# Patient Record
Sex: Female | Born: 1945
Health system: Southern US, Community
[De-identification: ages and names within clinical notes are randomized; demographics above are authoritative.]

## PROBLEM LIST (undated history)

## (undated) DIAGNOSIS — L301 Dyshidrosis [pompholyx]: Secondary | ICD-10-CM

## (undated) DIAGNOSIS — I714 Abdominal aortic aneurysm, without rupture, unspecified: Secondary | ICD-10-CM

## (undated) DIAGNOSIS — K760 Fatty (change of) liver, not elsewhere classified: Secondary | ICD-10-CM

## (undated) DIAGNOSIS — I739 Peripheral vascular disease, unspecified: Secondary | ICD-10-CM

## (undated) DIAGNOSIS — M797 Fibromyalgia: Secondary | ICD-10-CM

## (undated) DIAGNOSIS — E785 Hyperlipidemia, unspecified: Secondary | ICD-10-CM

## (undated) DIAGNOSIS — D132 Benign neoplasm of duodenum: Secondary | ICD-10-CM

## (undated) DIAGNOSIS — K219 Gastro-esophageal reflux disease without esophagitis: Secondary | ICD-10-CM

## (undated) DIAGNOSIS — T7840XA Allergy, unspecified, initial encounter: Secondary | ICD-10-CM

## (undated) DIAGNOSIS — K5790 Diverticulosis of intestine, part unspecified, without perforation or abscess without bleeding: Secondary | ICD-10-CM

## (undated) DIAGNOSIS — I209 Angina pectoris, unspecified: Secondary | ICD-10-CM

## (undated) DIAGNOSIS — J439 Emphysema, unspecified: Secondary | ICD-10-CM

## (undated) DIAGNOSIS — D126 Benign neoplasm of colon, unspecified: Secondary | ICD-10-CM

## (undated) DIAGNOSIS — I10 Essential (primary) hypertension: Secondary | ICD-10-CM

## (undated) DIAGNOSIS — I701 Atherosclerosis of renal artery: Secondary | ICD-10-CM

## (undated) DIAGNOSIS — M810 Age-related osteoporosis without current pathological fracture: Secondary | ICD-10-CM

## (undated) DIAGNOSIS — J449 Chronic obstructive pulmonary disease, unspecified: Secondary | ICD-10-CM

## (undated) DIAGNOSIS — G43909 Migraine, unspecified, not intractable, without status migrainosus: Secondary | ICD-10-CM

## (undated) DIAGNOSIS — M1990 Unspecified osteoarthritis, unspecified site: Secondary | ICD-10-CM

## (undated) DIAGNOSIS — I219 Acute myocardial infarction, unspecified: Secondary | ICD-10-CM

## (undated) DIAGNOSIS — E119 Type 2 diabetes mellitus without complications: Secondary | ICD-10-CM

## (undated) DIAGNOSIS — I2510 Atherosclerotic heart disease of native coronary artery without angina pectoris: Secondary | ICD-10-CM

## (undated) HISTORY — PX: BREAST BIOPSY: SHX20

## (undated) HISTORY — DX: Emphysema, unspecified: J43.9

## (undated) HISTORY — DX: Atherosclerotic heart disease of native coronary artery without angina pectoris: I25.10

## (undated) HISTORY — DX: Age-related osteoporosis without current pathological fracture: M81.0

## (undated) HISTORY — DX: Unspecified osteoarthritis, unspecified site: M19.90

## (undated) HISTORY — DX: Dyshidrosis (pompholyx): L30.1

## (undated) HISTORY — DX: Migraine, unspecified, not intractable, without status migrainosus: G43.909

## (undated) HISTORY — PX: CARDIAC CATHETERIZATION: SHX172

## (undated) HISTORY — DX: Diverticulosis of intestine, part unspecified, without perforation or abscess without bleeding: K57.90

## (undated) HISTORY — DX: Hyperlipidemia, unspecified: E78.5

## (undated) HISTORY — PX: TONSILLECTOMY: SUR1361

## (undated) HISTORY — DX: Benign neoplasm of colon, unspecified: D12.6

## (undated) HISTORY — DX: Type 2 diabetes mellitus without complications: E11.9

## (undated) HISTORY — DX: Chronic obstructive pulmonary disease, unspecified: J44.9

## (undated) HISTORY — DX: Essential (primary) hypertension: I10

## (undated) HISTORY — DX: Allergy, unspecified, initial encounter: T78.40XA

## (undated) HISTORY — DX: Peripheral vascular disease, unspecified: I73.9

## (undated) HISTORY — DX: Fibromyalgia: M79.7

## (undated) HISTORY — DX: Benign neoplasm of duodenum: D13.2

## (undated) HISTORY — DX: Atherosclerosis of renal artery: I70.1

## (undated) HISTORY — PX: ABDOMINAL HYSTERECTOMY: SHX81

## (undated) HISTORY — PX: OTHER SURGICAL HISTORY: SHX169

---

## 1984-12-06 DIAGNOSIS — L301 Dyshidrosis [pompholyx]: Secondary | ICD-10-CM

## 1984-12-06 HISTORY — DX: Dyshidrosis (pompholyx): L30.1

## 1987-12-07 HISTORY — PX: TONSILECTOMY, ADENOIDECTOMY, BILATERAL MYRINGOTOMY AND TUBES: SHX2538

## 1990-12-06 HISTORY — PX: TOTAL ABDOMINAL HYSTERECTOMY: SHX209

## 1993-08-06 HISTORY — PX: CARPAL TUNNEL RELEASE: SHX101

## 1994-07-06 HISTORY — PX: CORONARY ARTERY BYPASS GRAFT: SHX141

## 1997-01-06 HISTORY — PX: CARPAL TUNNEL RELEASE: SHX101

## 1998-05-13 ENCOUNTER — Ambulatory Visit (HOSPITAL_COMMUNITY): Admission: RE | Admit: 1998-05-13 | Discharge: 1998-05-13 | Payer: Self-pay | Admitting: Otolaryngology

## 1998-05-30 ENCOUNTER — Other Ambulatory Visit: Admission: RE | Admit: 1998-05-30 | Discharge: 1998-05-30 | Payer: Self-pay | Admitting: Otolaryngology

## 1998-07-11 ENCOUNTER — Other Ambulatory Visit: Admission: RE | Admit: 1998-07-11 | Discharge: 1998-07-11 | Payer: Self-pay | Admitting: Otolaryngology

## 1998-12-06 HISTORY — PX: MULTIPLE TOOTH EXTRACTIONS: SHX2053

## 2000-09-13 ENCOUNTER — Encounter: Payer: Self-pay | Admitting: Cardiology

## 2000-09-13 ENCOUNTER — Encounter: Admission: RE | Admit: 2000-09-13 | Discharge: 2000-09-13 | Payer: Self-pay | Admitting: Cardiology

## 2001-05-02 ENCOUNTER — Other Ambulatory Visit: Admission: RE | Admit: 2001-05-02 | Discharge: 2001-05-02 | Payer: Self-pay | Admitting: Obstetrics and Gynecology

## 2001-05-06 DIAGNOSIS — D126 Benign neoplasm of colon, unspecified: Secondary | ICD-10-CM

## 2001-05-06 HISTORY — DX: Benign neoplasm of colon, unspecified: D12.6

## 2001-05-17 ENCOUNTER — Encounter (INDEPENDENT_AMBULATORY_CARE_PROVIDER_SITE_OTHER): Payer: Self-pay | Admitting: Specialist

## 2001-05-17 ENCOUNTER — Other Ambulatory Visit: Admission: RE | Admit: 2001-05-17 | Discharge: 2001-05-17 | Payer: Self-pay | Admitting: Gastroenterology

## 2001-09-19 ENCOUNTER — Encounter: Payer: Self-pay | Admitting: Cardiology

## 2001-09-19 ENCOUNTER — Encounter: Admission: RE | Admit: 2001-09-19 | Discharge: 2001-09-19 | Payer: Self-pay | Admitting: Cardiology

## 2002-09-05 HISTORY — PX: HAND SURGERY: SHX662

## 2002-09-20 ENCOUNTER — Encounter: Admission: RE | Admit: 2002-09-20 | Discharge: 2002-09-20 | Payer: Self-pay | Admitting: Obstetrics and Gynecology

## 2002-09-20 ENCOUNTER — Encounter: Payer: Self-pay | Admitting: Cardiology

## 2002-09-20 ENCOUNTER — Encounter: Admission: RE | Admit: 2002-09-20 | Discharge: 2002-09-20 | Payer: Self-pay | Admitting: Cardiology

## 2002-09-20 ENCOUNTER — Encounter: Payer: Self-pay | Admitting: Obstetrics and Gynecology

## 2003-03-06 ENCOUNTER — Encounter: Admission: RE | Admit: 2003-03-06 | Discharge: 2003-03-06 | Payer: Self-pay | Admitting: Obstetrics and Gynecology

## 2003-03-06 ENCOUNTER — Encounter: Payer: Self-pay | Admitting: Obstetrics and Gynecology

## 2003-05-01 ENCOUNTER — Encounter: Payer: Self-pay | Admitting: Cardiology

## 2003-05-01 ENCOUNTER — Encounter: Admission: RE | Admit: 2003-05-01 | Discharge: 2003-05-01 | Payer: Self-pay | Admitting: Cardiology

## 2003-10-21 ENCOUNTER — Encounter: Admission: RE | Admit: 2003-10-21 | Discharge: 2003-10-21 | Payer: Self-pay | Admitting: Obstetrics and Gynecology

## 2004-10-30 ENCOUNTER — Encounter: Admission: RE | Admit: 2004-10-30 | Discharge: 2004-10-30 | Payer: Self-pay | Admitting: Obstetrics and Gynecology

## 2005-12-08 ENCOUNTER — Encounter: Admission: RE | Admit: 2005-12-08 | Discharge: 2005-12-08 | Payer: Self-pay | Admitting: Obstetrics and Gynecology

## 2006-12-06 HISTORY — PX: RENAL ARTERY STENT: SHX2321

## 2006-12-12 ENCOUNTER — Encounter: Admission: RE | Admit: 2006-12-12 | Discharge: 2006-12-12 | Payer: Self-pay | Admitting: Obstetrics and Gynecology

## 2007-03-16 ENCOUNTER — Ambulatory Visit: Payer: Self-pay | Admitting: *Deleted

## 2007-09-14 ENCOUNTER — Ambulatory Visit: Payer: Self-pay | Admitting: *Deleted

## 2007-09-14 ENCOUNTER — Encounter: Admission: RE | Admit: 2007-09-14 | Discharge: 2007-09-14 | Payer: Self-pay | Admitting: *Deleted

## 2007-10-05 ENCOUNTER — Ambulatory Visit: Payer: Self-pay | Admitting: *Deleted

## 2007-10-11 ENCOUNTER — Ambulatory Visit: Payer: Self-pay | Admitting: *Deleted

## 2007-10-11 ENCOUNTER — Ambulatory Visit (HOSPITAL_COMMUNITY): Admission: RE | Admit: 2007-10-11 | Discharge: 2007-10-11 | Payer: Self-pay | Admitting: Gastroenterology

## 2007-11-09 ENCOUNTER — Ambulatory Visit: Payer: Self-pay | Admitting: *Deleted

## 2007-12-07 HISTORY — PX: ABDOMINAL AORTIC ANEURYSM REPAIR: SUR1152

## 2007-12-18 ENCOUNTER — Ambulatory Visit (HOSPITAL_COMMUNITY): Admission: RE | Admit: 2007-12-18 | Discharge: 2007-12-19 | Payer: Self-pay | Admitting: *Deleted

## 2007-12-18 ENCOUNTER — Ambulatory Visit: Payer: Self-pay | Admitting: *Deleted

## 2007-12-28 ENCOUNTER — Ambulatory Visit: Payer: Self-pay | Admitting: *Deleted

## 2008-01-25 ENCOUNTER — Encounter: Admission: RE | Admit: 2008-01-25 | Discharge: 2008-01-25 | Payer: Self-pay | Admitting: *Deleted

## 2008-01-25 ENCOUNTER — Ambulatory Visit: Payer: Self-pay | Admitting: *Deleted

## 2008-02-02 ENCOUNTER — Encounter: Admission: RE | Admit: 2008-02-02 | Discharge: 2008-02-02 | Payer: Self-pay | Admitting: Obstetrics and Gynecology

## 2008-04-08 ENCOUNTER — Encounter: Admission: RE | Admit: 2008-04-08 | Discharge: 2008-04-08 | Payer: Self-pay | Admitting: Orthopedic Surgery

## 2008-07-25 ENCOUNTER — Ambulatory Visit: Payer: Self-pay | Admitting: *Deleted

## 2008-08-22 ENCOUNTER — Ambulatory Visit: Payer: Self-pay | Admitting: *Deleted

## 2009-02-20 ENCOUNTER — Ambulatory Visit: Payer: Self-pay | Admitting: *Deleted

## 2009-02-28 ENCOUNTER — Encounter: Admission: RE | Admit: 2009-02-28 | Discharge: 2009-02-28 | Payer: Self-pay | Admitting: Obstetrics and Gynecology

## 2009-04-29 ENCOUNTER — Encounter (INDEPENDENT_AMBULATORY_CARE_PROVIDER_SITE_OTHER): Payer: Self-pay | Admitting: *Deleted

## 2009-08-29 ENCOUNTER — Ambulatory Visit: Payer: Self-pay | Admitting: Vascular Surgery

## 2009-08-29 ENCOUNTER — Encounter: Admission: RE | Admit: 2009-08-29 | Discharge: 2009-08-29 | Payer: Self-pay | Admitting: Vascular Surgery

## 2009-09-08 ENCOUNTER — Ambulatory Visit: Payer: Self-pay | Admitting: Gastroenterology

## 2009-09-17 ENCOUNTER — Ambulatory Visit: Payer: Self-pay | Admitting: Gastroenterology

## 2009-11-11 HISTORY — PX: CARDIOVASCULAR STRESS TEST: SHX262

## 2010-02-24 ENCOUNTER — Ambulatory Visit: Payer: Self-pay | Admitting: Vascular Surgery

## 2010-05-27 ENCOUNTER — Encounter: Admission: RE | Admit: 2010-05-27 | Discharge: 2010-05-27 | Payer: Self-pay | Admitting: Obstetrics and Gynecology

## 2010-08-18 ENCOUNTER — Encounter: Admission: RE | Admit: 2010-08-18 | Discharge: 2010-08-18 | Payer: Self-pay | Admitting: Vascular Surgery

## 2010-08-18 ENCOUNTER — Ambulatory Visit: Payer: Self-pay | Admitting: Vascular Surgery

## 2010-11-12 ENCOUNTER — Ambulatory Visit: Payer: Self-pay | Admitting: Cardiology

## 2010-12-27 ENCOUNTER — Encounter: Payer: Self-pay | Admitting: *Deleted

## 2011-01-05 NOTE — Procedures (Signed)
Summary: Colonoscopy  Patient: Megan [name] Note: All result statuses are Final unless otherwise noted.  Tests: (1) Colonoscopy (COL)   COL Colonoscopy           DONE     [facility]     [address]     [place], [place]  [address]           COLONOSCOPY PROCEDURE REPORT           PATIENT:  Megan [name], Megan [name]  MR#:  [id number]     BIRTHDATE:  [birth year], 63 yrs. old  GENDER:  female           ENDOSCOPIST:  Judie Petit T. Russella Dar, MD, [facility]           PROCEDURE DATE:  09/17/2009     PROCEDURE:  Colonoscopy 56213     ASA CLASS:  Class II     INDICATIONS:  1) follow-up of polyp, adenomatous polyps, 05/2001           MEDICATIONS:   Fentanyl 100 mcg IV, Versed 10 mg IV           DESCRIPTION OF PROCEDURE:   After the risks benefits and     alternatives of the procedure were thoroughly explained, informed     consent was obtained.  Digital rectal exam was performed and     revealed no abnormalities.   The LB PCF-H180AL [id number] endoscope     was introduced through the anus and advanced to the cecum, which     was identified by both the appendix and ileocecal valve, without     limitations.  The quality of the prep was good, using MoviPrep.     The instrument was then slowly withdrawn as the colon was fully     examined.     <<PROCEDUREIMAGES>>           FINDINGS:  Moderate diverticulosis was found sigmoid to     descending.  A normal appearing cecum, ileocecal valve, and     appendiceal orifice were identified. The ascending, hepatic     flexure, transverse, splenic flexure, and rectum appeared     unremarkable. Retroflexed views in the rectum revealed no     abnormalities. The time to cecum =  3.25  minutes. The scope was     then withdrawn (time =  7.25  min) from the patient and the     procedure completed.           COMPLICATIONS:  None           ENDOSCOPIC IMPRESSION:     1) Moderate diverticulosis in the sigmoid to descending           RECOMMENDATIONS:     1) high fiber  diet     2) Repeat Colonoscopy in 5 years.           Venita Lick. Russella Dar, MD, [facility]           n.     eSIGNED:   Venita Lick. Stark at 09/17/2009 09:04 AM           Rutherford Nail, [id number]  Note: An exclamation mark (!) indicates a result that was not dispersed into the flowsheet. Document Creation Date: 09/17/2009 9:04 AM _______________________________________________________________________  (1) Order result status: Final Collection or observation date-time: 09/17/2009 08:58 Requested date-time:  Receipt date-time:  Reported date-time:  Referring Physician:   Ordering Physician: Claudette Head [phone]) Specimen Source:  Source: Launa Grill Order Number:  [id number] Lab site:   Appended Document: Colonoscopy    Clinical Lists Changes  Observations: Added new observation of COLONNXTDUE: 09/2014 (09/17/2009 10:54)

## 2011-01-05 NOTE — Letter (Signed)
Summary: Recall Colonoscopy Letter  [facility]  [address] [place], [place] [address]   Phone: [phone]  Fax: [phone]      Apr 29, 2009 MRN: [id number]   TIARIA BIBY [address] [place], [place]  [address]   Dear Ms. Brunell,   According to your medical record, it is time for you to schedule a Colonoscopy. The American Cancer Society recommends this procedure as a method to detect early colon cancer. Patients with a family history of colon cancer, or a personal history of colon polyps or inflammatory bowel disease are at increased risk.  This letter has beeen generated based on the recommendations made at the time of your procedure. If you feel that in your particular situation this may no longer apply, please contact our office.  Please call our office at [phone] to schedule this appointment or to update your records at your earliest convenience.  Thank you for cooperating with [place] to provide you with the very best care possible.   Sincerely,  Judie Petit T. Russella Dar, M.D.  [facility] [phone]

## 2011-01-05 NOTE — Miscellaneous (Signed)
Summary: [facility]  Clinical Lists Changes  Medications: Added new medication of MOVIPREP 100 GM  SOLR (PEG-KCL-NACL-NASULF-NA ASC-C) As per prep instructions. - Signed Rx of MOVIPREP 100 GM  SOLR (PEG-KCL-NACL-NASULF-NA ASC-C) As per prep instructions.;  #1 x 0;  Signed;  Entered by: Ezra Sites RN;  Authorized by: Meryl Dare MD [facility];  Method used: Electronically [phone] [address], [place], [place], [place]  [address], Ph: [phone] or [phone], Fax: [phone] Allergies: Added new allergy or adverse reaction of CODEINE Observations: Added new observation of NKA: F (09/08/2009 8:07)    Prescriptions: MOVIPREP 100 GM  SOLR (PEG-KCL-NACL-NASULF-NA ASC-C) As per prep instructions.  #1 x 0   Entered by:   Ezra Sites RN   Authorized by:   Meryl Dare MD [facility]   Signed by:   Ezra Sites RN on 09/08/2009   Method used:   Electronically to        [facility] [address] [phone][facility] (retail)       [address]       [place], [place]  [address]       Ph: [phone] or [phone]       Fax: [phone]   RxID:   [id number]

## 2011-03-01 ENCOUNTER — Other Ambulatory Visit: Payer: Self-pay | Admitting: *Deleted

## 2011-03-01 DIAGNOSIS — I10 Essential (primary) hypertension: Secondary | ICD-10-CM

## 2011-03-01 MED ORDER — TORSEMIDE 20 MG PO TABS
ORAL_TABLET | ORAL | Status: DC
Start: 2011-03-01 — End: 2012-03-02

## 2011-03-01 NOTE — Telephone Encounter (Signed)
Refilled meds per fax request.  

## 2011-03-16 ENCOUNTER — Telehealth: Payer: Self-pay | Admitting: Cardiology

## 2011-03-16 NOTE — Telephone Encounter (Signed)
Megan [name]: Give a call only wishes to speak to Megan [name].

## 2011-04-16 ENCOUNTER — Other Ambulatory Visit: Payer: Self-pay | Admitting: Cardiology

## 2011-04-16 ENCOUNTER — Telehealth: Payer: Self-pay | Admitting: Cardiology

## 2011-04-16 DIAGNOSIS — Z951 Presence of aortocoronary bypass graft: Secondary | ICD-10-CM

## 2011-04-16 DIAGNOSIS — I10 Essential (primary) hypertension: Secondary | ICD-10-CM

## 2011-04-16 DIAGNOSIS — E7800 Pure hypercholesterolemia, unspecified: Secondary | ICD-10-CM

## 2011-04-16 NOTE — Telephone Encounter (Signed)
Labs and visit scheduled.

## 2011-04-16 NOTE — Telephone Encounter (Signed)
No need to call patient.  Please add on any additional labs that are required.

## 2011-04-20 NOTE — H&P (Signed)
Megan [name], VARNEY                ACCOUNT NO.:  [id number]   MEDICAL RECORD NO.:  [id number]          PATIENT TYPE:  INP   LOCATION:  2899                         FACILITY:  [facility]   PHYSICIAN:  Balinda Quails, M.D.    DATE OF BIRTH:  07/09/1946   DATE OF ADMISSION:  12/18/2007  DATE OF DISCHARGE:                              HISTORY & PHYSICAL   CARDIOLOGIST:  Cassell Clement, MD.   ADMISSION DIAGNOSIS:  5-cm abdominal aortic aneurysm.   HISTORY.:  Megan [name] is a 65 year old female with a known abdominal  aortic aneurysm followed in the [facility] office. The most recent CT scan  revealed her aneurysm to be 5.8 cm in maximal diameter.  Ultrasound  however revealed this to be 5 cm in maximal diameter.  This did  represent further enlargement.   The patient has had no symptoms.  Denies abdominal pain or back pain.   PAST MEDICAL HISTORY:  1. Coronary artery disease status post coronary artery bypass in 1995.  2. Hypertension.  3. Hyperlipidemia.  4. Renal artery stenosis status post right renal artery stenting      November 2008.   MEDICATIONS:  1. Septra DS 1 tablet b.i.d.  2. Flagyl 500 mg b.i.d.  3. Aspirin 325 mg daily.  4. Prilosec 10 mg daily.  5. Lipitor 80 mg daily.  6. Lisinopril 20 mg daily.  7. Demadex 20 mg daily.  8. Xanax 0.25 mg p.r.n.   ALLERGIES:  CODEINE and MORPHINE.   REVIEW OF SYSTEMS:  The patient denies any recent weight change or  anorexia.  No fever or chills.  No recent chest pain, shortness of  breath or palpitations.  Denies cough or sputum production.  No  abdominal pain.  Regular bowel habits. No dysuria or frequency.  Denies  joint discomfort. No visual or hearing loss.   FAMILY HISTORY:  Noncontributory.   PHYSICAL EXAMINATION:  GENERAL:  Well-appearing, 65 year old female.  Alert and oriented. No acute distress.  VITAL SIGNS:  Temperature 98.7, heart rate is 70 per minute, BP is  120/80, respirations 18 per minute.  HEENT:  Mouth and  throat are clear.  Normocephalic.  Extraocular  movements intact.  NECK:  Supple.  No thyromegaly or adenopathy.  Chest:  Clear, equal air entry bilaterally.  No rales or rhonchi.  CARDIOVASCULAR:  Normal heart sounds without murmurs.  No gallops or  rubs.  Regular rate and rhythm.  ABDOMEN:  Soft, nontender.  No masses or organomegaly.  Normal bowel  sounds.  LOWER EXTREMITIES:  2+ femoral pulses bilaterally.  No ankle edema.  NEUROLOGIC:  The patient is alert and oriented.  Cranial nerves intact.  Strength equal bilaterally.  1+ reflexes.  SKIN:  No ulceration or rash.   IMPRESSION:  1. Enlarging 5-cm abdominal aortic aneurysm.  2. Coronary artery disease.  3. Hypertension.  4. Hyperlipidemia.  5. Renal vascular disease.   PLAN:  The patient admitted electively to [facility] December 18, 2007 for endovascular repair abdominal aortic aneurysm.      Balinda Quails, M.D.  Electronically Signed  PGH/MEDQ  D:  12/18/2007  T:  12/18/2007  Job:  [id number]   cc:   Cassell Clement, M.D.

## 2011-04-20 NOTE — Op Note (Signed)
Megan, [name]                ACCOUNT NO.:  [id number]   MEDICAL RECORD NO.:  [id number]          PATIENT TYPE:  AMB   LOCATION:  SDS                          FACILITY:  [facility]   PHYSICIAN:  Balinda Quails, M.D.    DATE OF BIRTH:  10-09-1946   DATE OF PROCEDURE:  10/11/2007  DATE OF DISCHARGE:                               OPERATIVE REPORT   SURGEON:  Balinda Quails, M.D.   DIAGNOSIS:  A 5-cm infrarenal abdominal aortic aneurysm.   PROCEDURE:  1. Abdominal aortogram with pelvic runoff.  2. Selective right renal arteriogram.  3. Right renal percutaneous transluminal angioplasty/stent.   CLINICAL NOTE:  Megan [name] is a 65 year old female with known  abdominal aortic aneurysm.  This has enlarged recently, now measuring 5  to 5.1 cm by ultrasound.  She had a CT angiogram for workup for stent  graft placement.  This revealed an ostial stenosis in the right main  renal artery.  She is brought to the cath lab at this time for  diagnostic workup with arteriography and possible renal artery  intervention.   PROCEDURE NOTE:  The patient was  brought to the cath lab in stable  condition.  Placed in the supine position.  The right groin prepped and  draped in sterile fashion.  Administered 1 mg of Versed and 100 mcg of  fentanyl during the procedure intravenously.   Skin and subcutaneous tissue in right groin instilled with 1% Xylocaine.  An 18-gauge needle introduced into the right common femoral artery.  A  0.035 J-wire passed through the needle into the mid abdominal aorta.  A  6-French sheath advanced over the guide wire.   A graduated pigtail catheter advanced over the guide wire to the mid  abdominal aorta.  Standard AP and mid abdominal aortogram obtained.  This revealed the left renal artery to be the lower-most.  This was  widely patent.  The right renal artery revealed a high-grade stenosis at  the ostium.  There was approximately a 1- to 2-cm infrarenal aortic neck  below  the left main renal artery.   The pigtail catheter was left in position, and a repeat arteriogram with  cranial angulation and an enlarged image verified the above-noted  findings.   The pigtail catheter was then brought down to the aortic bifurcation and  bilateral oblique pelvic arteriograms obtained.  This revealed the  hypogastric arteries to be widely patent bilaterally.  The external  iliacs were intact.  The common iliac arteries were also intact  bilaterally.   A guide wire was then reinserted and an exchange made for an IMA guide  catheter.  The IMA guide catheter was engaged in the origin of the right  renal artery.  A 0.014 Whisper guide wire was then advanced through the  guide and into the right main renal artery.  Images of the right main  renal artery stenosis were then obtained and verified an ostial stenosis  estimated to be 80%.  There was poststenotic dilatation.   The patient administered 5000 units of heparin intravenously.  Maximal  ACT  measured 389 seconds.   A 6 x 12 Genesis stent was then advanced into position.  Position  verified with puff contrast injection, and the stent deployed at the  ostium of the right main renal artery.  A completion arteriogram  revealed excellent technical result without apparent complication.   The patient tolerated the procedure well.   The guide wire was removed, the J-wire reinserted and the guide catheter  removed.  Sheath left in place until ACT is appropriate and will be  removed.   FINAL IMPRESSION:  1. Severe right renal artery stenosis status post uncomplicated      stenting.  2. A 5.1-cm infrarenal abdominal aortic aneurysm.  3. Patent iliac vessels bilaterally.   DISPOSITION:  These results have been reviewed with the patient and  family.  The patient will be electively scheduled for placement of an  abdominal aortic stent graft for closure of abdominal aortic aneurysm.      Balinda Quails, M.D.   Electronically Signed     PGH/MEDQ  D:  10/11/2007  T:  10/12/2007  Job:  [id number]

## 2011-04-20 NOTE — Procedures (Signed)
RENAL ARTERY DUPLEX EVALUATION   INDICATION:  Right renal artery stent.   HISTORY:  Diabetes:  No.  Cardiac:  CABG.  Hypertension:  Yes.  Smoking:  Previous.  Other:  Right renal artery stent on 10/11/07, endovascular AAA repair,  12/18/07.   RENAL ARTERY DUPLEX FINDINGS:  Aorta-Proximal:  45 cm/s  Aorta-Mid:  45 cm/s  Aorta-Distal:  31 cm/s  Celiac Artery Origin:  156 cm/s  SMA Origin:  139 cm/s                                    RIGHT               LEFT  Renal Artery Origin:             163/36 cm/s         55/13 cm/s  Renal Artery Proximal:           125/37 cm/s         124/39 cm/s  Renal Artery Mid:                45/25 cm/s          158/54 cm/s  Renal Artery Distal:             59/15 cm/s          154/68 cm/s  Hilar Acceleration Time (AT):  Renal-Aortic Ratio (RAR):        3.6                 3.5  Kidney Size:                     8.6 cm X 4.3 Cm     10 cm X 5.6 cm  End Diastolic Ratio (EDR):  Resistive Index (RI):            0.62                0.69   IMPRESSION:  1. Doppler velocities and renal aortic ratio suggests >60% stenosis in      the right renal origin and left renal mid arteries.  2. The intrarenal resistive indices are normal bilaterally.  3. Right kidney measures slightly smaller than the left kidney.  4. Kidneys appear to be widely patent bilaterally.  5. Left mid renal artery is tortuous in the mid section, which could      be contributing to accelerated velocities.   ___________________________________________  Quita Skye Hart Rochester, M.D.   CJ/MEDQ  D:  02/24/2010  T:  02/24/2010  Job:  [id number]

## 2011-04-20 NOTE — Assessment & Plan Note (Signed)
OFFICE VISIT   Megan [name], Megan [name]  DOB:  [birth year]                                       12/28/2007  XBJYN#:[id number]   The patient presents today to have her left groin checked after  undergoing AAA stent graft placements 12/18/2007 at [facility].   BP 120/70, pulse 85 per minute, and respirations 16 per minute.  Evaluation of specifically the left groin reveals no major problems.  Mild skin excoriation.  Mild edema.  No infection evidence.  Right groin  healing unremarkably.  Intact dorsalis pedis pulses present bilaterally.   The patient reassured.  No significant problems identified.  To return  in 1 month.   Balinda Quails, M.D.  Electronically Signed   PGH/MEDQ  D:  12/28/2007  T:  12/29/2007  Job:  [id number]

## 2011-04-20 NOTE — Assessment & Plan Note (Signed)
OFFICE VISIT   Megan, [name]  DOB:  Aug 13, 1946                                       09/14/2007  EAVWU#:[id number]   The patient returns for continued surveillance of known abdominal aortic  aneurysm.  She has been free of any abdominal pain or back pain.   History of coronary disease status post coronary artery bypass.  No  recent chest pain.   Ultrasound of the abdomen reveals her aneurysm to have enlarged further,  now measuring 5.0 cm in maximal diameter.   Evaluation reveals a generally well-appearing 65 year old female.  Vital  signs:  BP 174/83, pulse 67 per minute, respirations 18 per minute.  O2  saturation 98%.  No carotid bruits.  Heart sounds are normal without  murmurs.  Chest clear with equal air entry bilaterally.  Abdomen is soft  and nontender.  No masses or organomegaly.  Normal bowel sounds without  bruits.  2+ femoral pulses bilaterally.   With continued growth in this aneurysm, I have recommended CT scan to  further evaluate with return office visit.  Consider possible aortic  stent graft placement if appropriate architecture of  the aneurysm present.  The patient is to see Dr. Patty Sermons this  afternoon.  I have written a note to him to consider stress testing if  appropriate prior to operative intervention.   Balinda Quails, M.D.  Electronically Signed   PGH/MEDQ  D:  09/14/2007  T:  09/15/2007  Job:  [id number]   cc:   Cassell Clement, M.D.

## 2011-04-20 NOTE — Assessment & Plan Note (Signed)
OFFICE VISIT   Megan [name], Megan [name]  DOB:  1946/06/06                                       08/18/2010  ZOXWR#:[id number]   The patient presents today for followup of her stent graft repair of  abdominal aortic aneurysm by Dr. Liliane Bade in January of 2009.  That  this was a Talent graft.  She is here today with repeat CT scan for  followup.  She is also status post right renal artery angioplasty and  stenting and does have a known moderate stenosis in her left kidney.  She reports no new major medical problems, no cardiac difficulties.  She  does have history of hypertension and elevated cholesterol.   SOCIAL HISTORY:  She is married with no children.  She is retired.  She  quit smoking 13 years ago.  Does have one to two alcohol drinks per day.   Family history is significant for myocardial infarction in her father  and mother, two brothers and a sister.   REVIEW OF SYSTEMS:  No weight loss or weight gain.  She reports weight  of 158 pounds.  She is 5 feet 1 inch tall.  VASCULAR:  She does report pain in her legs with walking from a vascular  standpoint.  CARDIAC:  Positive for shortness of breath.  GI, neurologic, pulmonary, hematologic, urinary, ENT all normal.  MUSCULOSKELETAL:  Positive for arthritis, joint pain, muscle pain.  PSYCHIATRIC:  Negative.   PHYSICAL EXAM.:  General:  A well-developed, well-nourished white female  appearing stated age, in no acute distress.  Vital signs:  Blood  pressure is 137/79, pulse 80, respirations 18 and temperature is 97.7.  HEENT:  Normal.  Chest:  Clear bilaterally.  Heart:  Regular rate and  rhythm.  Radial and femoral pulses are 2+ bilaterally.  Abdomen:  Reveals no masses, no tenderness and no palpable aneurysm.  Musculoskeletal:  No major deformities, cyanosis.  Neurological:  No  focal weakness or paresthesias.  Skin:  Without ulcers or rashes.   She did undergo CT angiogram today and I reviewed this with  the patient.  This does show continued shrinking in size of her aneurysm sac down from  5.0 to 4.3.  She has good positioning of her stent graft with good flow  through the limbs of her graft.  I reassured her regarding this and we  will see her again in 1 year with repeat CT scan at that time.  We will  also obtain a duplex of her renal arteries in 1 year for followup of her  prior right renal artery stent.  This does appear patent by CT angio.     Larina Earthly, M.D.  Electronically Signed   TFE/MEDQ  D:  08/18/2010  T:  08/19/2010  Job:  [id number]   cc:   Cassell Clement, M.D.

## 2011-04-20 NOTE — Assessment & Plan Note (Signed)
OFFICE VISIT   Megan [name], NOP  DOB:  [birth year]                                       08/29/2009  ZOXWR#:[id number]   The patient presents today for evaluation of stent graft repair of  abdominal aortic aneurysm by Dr. Liliane Bade on 12/18/2007.  She is here  today with CT angiogram for ongoing followup of her stent graft.  She  has no new medical difficulties.  Specifically no new cardiac problems  since her last visit with [place].  She has no symptoms referable to her  aneurysm.   PHYSICAL EXAM:  Her radial pulses are 2+, she has 2+ femoral, she does  have 2+ dorsalis pedis pulse left and diminished pedal pulse on the  right.  Her abdominal exam reveals no tenderness and no pulsatile mass.   I reviewed her CAT scan with her.  This shows no enlargement and some  slight shrinkage in her native aorta and her right renal artery stent is  patent.  She has no evidence of endo leak.  She will continue her usual  activities.  I plan to see her again in 1 year with repeat CT scan at  that time.   Larina Earthly, M.D.  Electronically Signed   TFE/MEDQ  D:  08/29/2009  T:  08/30/2009  Job:  [id number]   cc:   Cassell Clement, M.D.

## 2011-04-20 NOTE — Op Note (Signed)
NAMENEAVEH, BELANGER                ACCOUNT NO.:  [id number]   MEDICAL RECORD NO.:  [id number]          PATIENT TYPE:  INP   LOCATION:  3305                         FACILITY:  [facility]   PHYSICIAN:  Janetta Hora. Fields, MD  DATE OF BIRTH:  25-Aug-1946   DATE OF PROCEDURE:  12/18/2007  DATE OF DISCHARGE:                               OPERATIVE REPORT   PROCEDURE:  Exposure and repair of left common femoral artery for access  for endovascular stent grafting.   PREOPERATIVE DIAGNOSIS:  Abdominal aortic aneurysm.   POSTOPERATIVE DIAGNOSIS:  Abdominal aortic aneurysm.   ANESTHESIA:  General.   SURGEON:  Charles E. Darrick Penna, M.D.   ASSISTANTBalinda Quails, M.D.   INDICATIONS:  The patient is a 65 year old female who presents for  placement of endovascular stent graft.  She needs exposure of the left  common femoral artery for access for stent graft placement.   OPERATIVE DETAILS:  After obtaining informed consent, the patient taken  to the operating room.  The patient placed supine position on operating  table.  After induction of general anesthesia and endotracheal  intubation, the patient's abdomen and upper thighs were prepped and  draped in usual sterile fashion.   Next, an oblique incision was made in the left groin just below the  inguinal ligament.  Incision was carried down through the subcutaneous  tissues down to level of the left common femoral artery.  This was  dissected free circumferentially.  Dissection was carried up to the  level of the inguinal ligament and down to the level of the femoral  bifurcation.  Vessel loops were placed around the artery.  Endovascular  access was then obtained by puncturing the needle with a Majestic needle  in the left common femoral artery.  Details of the stent grafting  procedure are dictated separately by Dr. Madilyn Fireman.  After all sheaths,  guidewires, and catheters were removed, the left common femoral artery  was clamped proximally with a  Henley clamp.  It was controlled distally  with a vessel loop.  The arteriotomy was repaired using a running 5-0  Prolene suture.  Just prior to completion of the anastomosis, this was  forebled, backbled, and thoroughly flushed.  The anastomosis was secured  and the clamps released.  There was good pulsatile flow in the artery  immediately.  Hemostasis was obtained.  The deep layer of the groin was  closed with running 2-0 Vicryl suture followed by 3-0 Vicryl suture, and  the skin was closed with 4-0 Monocryl  subcuticular stitch.  The patient tolerated this portion of the  procedure well, and there were no complications.  Instrument, sponge,  and needle counts were correct at the end of the case.   The patient was taken to the recovery room in stable condition with a  warm, well-perfused left foot.      Janetta Hora. Fields, MD  Electronically Signed     CEF/MEDQ  D:  12/18/2007  T:  12/18/2007  Job:  [id number]

## 2011-04-20 NOTE — Assessment & Plan Note (Signed)
OFFICE VISIT   BLONNIE, MASKE  DOB:  Dec 01, 1946                                       10/05/2007  [id number]#:[id number]   The patient returns today with the results of her CT scan.  This does  verify an enlarging abdominal aortic aneurysm.  This is measured to be  5.8 cm in maximal diameter by CT.  However, I do think this represents a  tangential image and her ultrasound is more accurate at 5 cm.   She has a 1.7 cm infrarenal aortic neck with a diameter of 2.2 cm.  Ileac arteries are adequate in size for potential placement of an aortic  stent graft.   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  There are 2 left renal arteries present.   I feel the best option at this time would be to go ahead and do an  aortogram and selective renal arteriogram to further evaluate her  aneurysm and also to evaluate the right renal artery lesion.  This is  scheduled for 10/11/2007 at [facility]  She will begin Plavix  75 mg daily 4 days prior to the procedure, in addition to her current  dose of aspirin.  Potential risks of the procedure with a major  complication of 1% have been reviewed, including the risk of emergency  surgery.   Balinda Quails, M.D.  Electronically Signed   PGH/MEDQ  D:  10/05/2007  T:  10/06/2007  Job:  [id number]   cc:   Cassell Clement, M.D.

## 2011-04-20 NOTE — Procedures (Signed)
RENAL ARTERY DUPLEX EVALUATION   INDICATION:  Followup, right renal artery stent.   HISTORY:  Diabetes:  No.  Cardiac:  CABG.  Hypertension:  Yes.  Smoking:  Quit.   RENAL ARTERY DUPLEX FINDINGS:  Aorta-Proximal:  74 cm/s  Aorta-Mid:  72 cm/s  Aorta-Distal:  68 cm/s  Celiac Artery Origin:  162 cm/s  SMA Origin:  143 cm/s                                    RIGHT               LEFT  Renal Artery Origin:             Unable to image     101/25 cm/s  Renal Artery Proximal:           Unable to image     101/25 cm/s  Renal Artery Mid:                72/27 cm/s          61/17 cm/s  Renal Artery Distal:             69/25 cm/s          77/23 cm/s  Hilar Acceleration Time (AT):    0.048 sec           0.10 sec  Renal-Aortic Ratio (RAR):        0.97                1.36  Kidney Size:                     9.17 cm X 4.72 cm   11 cm X 5.17 cm  End Diastolic Ratio (EDR):  Resistive Index (RI):            0.69                0.68   IMPRESSION:  1. Right kidney appears somewhat diminished in size compared to the      left.  2. Imaged areas of renal arteries appear patent without evidence of      stenosis.  3. Unable to visualize right renal artery origin or proximal portions.  4. Technically difficult study due to body habitus and bowel gas,      causing suboptimal imaging in some areas.      ___________________________________________  P. Liliane Bade, M.D.   AS/MEDQ  D:  08/22/2008  T:  08/22/2008  Job:  [id number]

## 2011-04-20 NOTE — Procedures (Signed)
DUPLEX ULTRASOUND OF ABDOMINAL AORTA   INDICATION:  Follow up abdominal aortic aneurysm.   HISTORY:  Diabetes:  No.  Cardiac:  MI in 1995, CABG in 1995.  Hypertension:  Yes.  Smoking:  No.  Connective Tissue Disorder:  Family History:  Yes.  Previous Surgery:   DUPLEX EXAM:         AP (cm)                   TRANSVERSE (cm)  Proximal             3.60 cm                   3.69 cm  Mid                  5.00 cm                   5.02 cm  Distal               2.86 cm                   2.80 cm  Right Iliac          1.16 cm  Left Iliac           1.19 cm   PREVIOUS:  Date: 03/16/2007  AP:  4.91  TRANSVERSE:  4.98   IMPRESSION:  1. Slight increase in known abdominal aortic aneurysm.  2. Common iliac arteries technically difficult to image due to body      habitus and bowel gas.   ___________________________________________  P. Liliane Bade, M.D.   AS/MEDQ  D:  09/14/2007  T:  09/15/2007  Job:  [id number]

## 2011-04-20 NOTE — Op Note (Signed)
[name], [name]                ACCOUNT NO.:  [id number]   MEDICAL RECORD NO.:  [id number]          PATIENT TYPE:  INP   LOCATION:  3305                         FACILITY:  [facility]   PHYSICIAN:  Balinda Quails, M.D.    DATE OF BIRTH:  1946/04/01   DATE OF PROCEDURE:  12/18/2007  DATE OF DISCHARGE:                               OPERATIVE REPORT   SURGEON:  Balinda Quails, MD   ASSISTANT:  Janetta Hora.  Fields, MD.   ANESTHETIC:  General endotracheal.   PREOPERATIVE DIAGNOSIS:  5 cm infrarenal abdominal aortic aneurysm.   POSTOPERATIVE DIAGNOSIS:  5 cm infrarenal abdominal aortic aneurysm.   PROCEDURE:  Endovascular aneurysm repair (Tallent 28 x 14 x 140 right  iliac, 14 x 12 x 75 left iliac).   CLINICAL NOTE:  Megan [name] is a 3 old female with known abdominal  aortic aneurysm.  This followed regularly in the CVTS office.  Recently  presented with CT scan revealing aneurysm to be 5.8 cm in maximal  diameter.  Ultrasound revealed this to be closer to 5 cm, however this  did represent significant growth.  Therefore, the patient is scheduled  at this time for placement of an endovascular stent graft for closure of  her aneurysm.   The patient did have a tight renal artery stenosis which was treated  with stenting unremarkably approximately 2 months ago.  Brought the  operating room at this time for elective placement of endovascular stent  graft.   PROCEDURE NOTE:  The patient brought to the operating room in stable  condition.  Placed in supine position.  General endotracheal anesthesia  induced.  The supine position the abdomen and both legs were prepped and  draped in sterile fashion.   Right femoral cutdown carried out by myself, left femoral cutdown  carried out by Dr. Darrick Penna, dictated under separate heading.   Right femoral cutdown carried out through oblique skin incision at the  inguinal ligament.  Dissection carried through subcutaneous tissue with  electrocautery.   Lymphatics divided.  The right common femoral artery  exposed up to the inguinal ligament encircled proximally with vessel  loop.  Small tributaries controlled with clips or ligated 3-0 silk and  divided.  Distally, dissection was carried down to the origin of the  profunda and superficial femoral artery where the common femoral artery  was encircled with vessel loop.   The patient administered 7000 units heparin intravenously.  Bilateral  common femoral artery access was obtained with 18 gauge needles 0.035  Rosen guidewires.  Bilateral 12-French sheath was then advanced over the  guidewires.  The patient administered 7000 units heparin intravenously.   A graduated pigtail catheter was then advanced up over the right femoral  guidewire to the juxtarenal position.  The right renal stent was  identified.  An abdominal aortogram obtained.  This allowed measurement  of the distance from the lowermost left renal artery to the hypogastric  takeoff of the right.  This was 160 cm.  Therefore a short graft was  chosen, main body of the graft 28 x 14  x 140 cm.   The right femoral sheath removed.  The main body of the graft inserted  over the delivery catheter inserted over guidewire and delivery catheter  to the juxtarenal position.  This was then deployed initially in the  suprarenal position.  Brought down to the level of the left lowermost  renal artery.  Graft deployed down and the left iliac gate was opened.   The Omni flush catheter was then brought down into the aortic sac and  using a angled Glidewire, the contralateral limb was accessed.  The Omni  flush catheter passed up into the contralateral limb and intraluminal  position was verified with twirling of the catheter also hard LAO and  RAO projections.   The catheter then advanced up into the proximal aorta.  The Amplatz  super stiff guidewire advanced through the left femoral sheath.  The  contralateral limb 14 x 12 x 75was then  advanced on a delivery catheter  up into the contralateral gait.  This was deployed appropriately in the  gate, brought down to the left common iliac artery.  Position of left  hypogastric artery was marked with a retrograde femoral arteriograms to  the left groin.  Full deployment completed.   Following this a coated balloon was advanced over the left femoral  guidewire after insertion of a 12-French sheath.  The coated balloon  inflated at the aortic neck and also at the junction of left iliac gate.  This brought down the left common iliac artery and a 6 x 4 Powerflex  balloon advanced over the right into the right femoral sheath into the  right common iliac artery and inflated at 8 atmospheres with  contralateral inflation using a kissing balloon technique of the coated  balloon.  Both balloons were then removed.  The Omni flush catheter then  reinserted and a completion arteriogram obtained.  This revealed no  evidence of type 1 or type 3 leak.  There was some blushing to the  graft.  Possible type 2 leak.  No significant leaks requiring correction  were identified.   Guidewires and catheters and then removed bilaterally.  Total  fluoroscopy time 6.53 minutes.   The sheaths were removed bilaterally.  Both femoral vessels were flushed  antegrade and retrograde.  The common femoral arteries closed  bilaterally with running 5-0 Prolene suture.  Clamps were removed.  Excellent flow present.  Adequate hemostasis obtained.  Sponge and  instrument counts correct.  The patient administered 50 mg protamine  intravenously.   The subcutaneous tissue then closed running 2-0 Vicryl suture in two  layers.  Skin closed 4-0 Monocryl.  Dermabond applied.  No apparent  complications.  The patient transferred to recovery in stable condition.      Balinda Quails, M.D.  Electronically Signed     PGH/MEDQ  D:  12/18/2007  T:  12/18/2007  Job:  [id number]   cc:   Cassell Clement, M.D.

## 2011-04-20 NOTE — Procedures (Signed)
ENDOVASCULAR STENT GRAFT EXAM   INDICATION:  Follow up endostent AAA repair.   HISTORY:  AAA repair.                           DUPLEX EVALUATION   AAA Sac Size:                 4.65 CM AP            5.39 CM TRV  Previous Sac Size:            5.6 (CT) CM AP        CM TRV  Evidence of an endoleak?      No                    No   Velocity Criteria:  Proximal Aorta                69 cm/sec  Proximal Stent Graft          72 cm/sec  Main Body Stent Graft-Mid     cm/sec  Right Limb-Proximal           80 cm/sec  Right Limb-Distal             89 cm/sec  Left Limb-Proximal            67 cm/sec  Left Limb-Distal              99 cm/sec  Patent Renal Arteries?        Yes                   Yes   IMPRESSION:  1. Aortic stent graft appears patent with no evidence of endoleak.  2. Residual abdominal aortic aneurysm sac size shows slight decrease      in size to 4.65 cm X 5.39 cm.  3. Note:  Technically difficult study due to bowel gas and body      habitus.   ___________________________________________  P. Liliane Bade, M.D.   AS/MEDQ  D:  07/25/2008  T:  07/25/2008  Job:  [id number]

## 2011-04-20 NOTE — Assessment & Plan Note (Signed)
OFFICE VISIT   Megan [name], Megan [name]  DOB:  May 17, 1946                                       11/09/2007  [id number]#:[id number]   OFFICE VISIT:  The patient underwent abdominal aortogram and a right  renal arteriogram, with right renal percutaneous transluminal  angioplasty and stenting, carried out on 10/11/2007.  She has a 5-cm  abdominal aortic aneurysm.   Since her recent angioplasty and stenting, she has had no major  complaints.  No abdominal pain.  She denies leg pain.  No back pain.  No  recent chest pain or shortness of breath.   Blood pressure is 125/73, pulse 80 per minute and regular, respirations  18 per minute.  The abdomen is soft and nontender.  No abdominal bruits.  Abdominal aortic aneurysm palpable.  No organomegaly or other masses  felt.  2+ femoral pulses bilaterally.  No evidence of complications in  the right groin.   The patient is doing well following her recent arteriogram with right  renal angioplasty and stenting.  We will schedule her for aortic stent  graft placement for her abdominal aortic aneurysm in January coming up.   Balinda Quails, M.D.  Electronically Signed   PGH/MEDQ  D:  11/09/2007  T:  11/10/2007  Job:  [id number]   cc:   Cassell Clement, M.D.

## 2011-04-20 NOTE — Procedures (Signed)
ENDOVASCULAR STENT GRAFT EXAM   INDICATION:  Follow up of abdominal aortic aneurysm stent repair.   HISTORY:  AAA.                           DUPLEX EVALUATION   AAA Sac Size:                 3.61 CM AP            3.89 CM TRV  Previous Sac Size:            4.65 CM AP            5.39 CM TRV  Evidence of an endoleak?      No                    No   Velocity Criteria:  Proximal Aorta                45 cm/sec  Proximal Stent Graft          45 cm/sec  Main Body Stent Graft-Mid     31 cm/sec  Right Limb-Proximal           68 cm/sec  Right Limb-Distal             88 cm/sec  Left Limb-Proximal            69 cm/sec  Left Limb-Distal              92 cm/sec  Patent Renal Arteries?        Yes                   Yes   IMPRESSION:  1. Patent endovascular stent with no evidence of leak noted.  2. Abdominal aortic aneurysm sac size has decreased to 3.61 X 3.89 cm.  3. Ankle brachial indices are within normal limits.   ___________________________________________  Quita Skye Hart [place], M.D.   CJ/MEDQ  D:  02/24/2010  T:  02/24/2010  Job:  [id number]

## 2011-04-20 NOTE — Procedures (Signed)
RENAL ARTERY DUPLEX EVALUATION   INDICATION:  Right renal artery stent.   HISTORY:  Diabetes:  No.  Cardiac:  CABG.  Hypertension:  Yes.  Smoking:  Previous.  Other:  Right renal artery stent on 10/11/2007, endovascular abdominal  aortic aneurysm repair on 12/18/2007.   RENAL ARTERY DUPLEX FINDINGS:  Aorta-Proximal:  57 cm/s  Aorta-Mid:  52 cm/s  Aorta-Distal:  62 cm/s  Celiac Artery Origin:  159 cm/s  SMA Origin:  242 cm/s                                    RIGHT               LEFT  Renal Artery Origin:             185/43 cm/s         156/38 cm/s  Renal Artery Proximal:           105/16 cm/s         150/32 cm/s  Renal Artery Mid:                85/18 cm/s          75/13 cm/s  Renal Artery Distal:             78/20 cm/s          56/17 cm/s  Hilar Acceleration Time (AT):  Renal-Aortic Ratio (RAR):        3.6                 3.0  Kidney Size:                     9.3 cm              11.9 cm  End Diastolic Ratio (EDR):  Resistive Index (RI):            0.77                0.7   IMPRESSION:  1. Doppler velocities and renal aortic ratio suggest a greater than      60% stenosis of the right renal artery origin.  2. The left renal artery appears patent with mildly elevated      velocities noted, however, no post stenotic turbulence is      visualized.  3. The intrarenal resistive indices are abnormal on the right side and      normal on the left side.  4. The right kidney measured slightly smaller than the left kidney,      however, both are within normal limits.  5. Incidental note:  There is no obvious evidence of an endoleak noted      in the abdominal aortic aneurysm sac.       ___________________________________________  P. Liliane Bade, M.D.   CH/MEDQ  D:  02/20/2009  T:  02/20/2009  Job:  [id number]

## 2011-04-20 NOTE — Assessment & Plan Note (Signed)
OFFICE VISIT   Megan [name], Megan [name]  DOB:  1946-08-06                                       08/22/2008  EAVWU#:[id number]   The patient underwent placement of an aortic stent graft for AAA on  12/18/2007 and had prior placement of a right renal stent.  She presents  today for routine scheduled followup visit.   Ultrasound of the abdomen reveals no evidence of endoleak.  Aneurysm  size has shrunk to 5.3 cm.   Duplex evaluation of the renal arteries reveals good flow in the mid and  distal right renal artery.  Unable to visualize the origin of the right  renal artery due to technical difficulties.  Kidney measures 9 cm on the  right and 11 cm on the left.   The patient is doing generally well.  No complaints.  Alert and  oriented, no distress.  BP 127/81, pulse 72 per minute.  Abdomen:  Soft,  nontender.  No masses or organomegaly.  2+ femoral pulses bilaterally.   Overall the patient is doing well without complicating features  associated with her stent graft and right renal stent.  We will plan  followup in 6 months with a CT angiogram.   Balinda Quails, M.D.  Electronically Signed   PGH/MEDQ  D:  08/22/2008  T:  08/24/2008  Job:  [id number]   cc:   Cassell Clement, M.D.

## 2011-04-20 NOTE — Assessment & Plan Note (Signed)
OFFICE VISIT   MARIETA, MARKOV  DOB:  1946/01/25                                       01/25/2008  [id number]#:[id number]   Patient is status post placement of a AAA stent graft on 12/18/07 at  [facility].  Uneventful procedure.  Discharged home in good  condition.   PHYSICAL EXAMINATION:  Presents today for postoperative evaluation.  No  major complaints.  BP 137/65, pulse 95 per minute, regular, respirations  16 per minute.  The groin incision is healing unremarkably.  There are  2+ femoral and posterior tibial pulses bilaterally.  No ankle edema.   Follow-up CT scan reveals good position of the stent.  No evidence of  endoleak.   Patient is doing well following her surgery.  We will plan followup in  six months with an ultrasound surveillance of her stent graft.   Balinda Quails, M.D.  Electronically Signed   PGH/MEDQ  D:  01/25/2008  T:  01/26/2008  Job:  [id number]   cc:   Cassell Clement, M.D.

## 2011-04-20 NOTE — Discharge Summary (Signed)
NAMEGEORGIANN, [name]                ACCOUNT NO.:  [id number]   MEDICAL RECORD NO.:  [id number]          PATIENT TYPE:  INP   LOCATION:  3305                         FACILITY:  [facility]   PHYSICIAN:  Balinda Quails, M.D.    DATE OF BIRTH:  03-06-1946   DATE OF ADMISSION:  12/18/2007  DATE OF DISCHARGE:  12/19/2007                               DISCHARGE SUMMARY   ADMITTING DIAGNOSIS:  A 5 cm abdominal aortic aneurysm.   DISCHARGE DIAGNOSIS:  A 5 cm abdominal aortic aneurysm.   SECONDARY DISCHARGE DIAGNOSES:  1. Coronary artery disease  status post coronary artery bypass in      1955.  2. Hypertension.  3. Hyperlipidemia.  4. Renal artery stenosis status post right renal artery stenting in      November 2008.   CONSULTS:  None.   PROCEDURE:  The patient had a endovascular repair of an abdominal aortic  aneurysm on December 18, 2007 by Balinda Quails, M.D.   BRIEF HISTORY AND PHYSICAL:  Megan [name] is a 65 year old female with a  known abdominal aortic aneurysm followed in the __________ office .  The  most recent CT scan revealed her aneurysm to be 5.8 cm in maximum  diameter.  The ultrasound, however, revealed this to be 5 cm in maximum  diameter.  This did represent further enlargement.  The patient has had  no symptoms.  The patient has denied abdominal pain or back pain.  After  discussing with the patient risks to the procedure and complications the  patient agreed to be admitted electively to [facility] on  December 18, 2007 for endovascular repair of abdominal aortic aneurysm.  The patient did sign consent for this procedure.   HOSPITAL COURSE:  The patient was admitted to [facility]  on  December 18, 2007 for endovascular repair of an abdominal aortic aneurysm  by Dr.  Balinda Quails.  The patient signed consent and followed through  with the elective procedure and tolerated with no complications.  The  patient was transferred after procedure to the floor on  7300 where she  continued to remain stable with no complications.  The patient did have  some swelling the nurse stated equivalent about to walnut size of the  left groin incision site and Dr. Madilyn Fireman was notified and so placed an ice  bag on groin to help alleviate swelling.  Postoperative day #1 on  December 19, 2007 the patient continued to state that she had no  complaints and the she was ready to go home.  She had ate and tolerated  food.  Also the patient had ambulated okay.  The patient said that she  did not have any pain in the groin area just the mild tenderness,  swelling from the incision.   PHYSICAL EXAMINATION:  VITAL SIGNS:  Blood pressure 98/35 which has been  baseline for patient.  Heart rate was 77 in sinus rhythm.  Respirations  16. Temperature 97.5.  Oxygen 92 on room air.  Ins and outs:  In of 525  and out 2800.  GENERAL:  The patient is alert and oriented times 3 sitting in chair  smiling.  CARDIOVASCULAR:  Heart rate is normal and regular rhythm.  LUNGS:  Clear bilaterally.  ABDOMEN:  Soft, non-tender, nondistended with active bowel sounds.  The  patient had ate and tolerated food without any  nausea or vomiting.  GROIN:  The incisions bilaterally were red.  The one on the left did  have some mild edema but there was no signs of infection and no  hematoma.  EXTREMITIES:  Bilaterally the feet were pink and warm and there were  pedal pulses positive too.   ASSESSMENT AND PLAN:  At this time, the patient is status post abdominal  aortic aneurysm stent repair.  Is to discontinue Foley, IV fluids, and A-  line.  Have the patient continue to ambulate and then discontinue to  home if the patient tolerates and continues to remain afebrile and  currently stable.  The patient agrees to this plan and is ready to go  home.   DISCHARGE MEDICATIONS:  1. Septra 2 times a day.  2. Flagyl 500 mg 2 times a day.  3. Aspirin  325 mg daily.  4. Prilosec 20 mg daily.  5. Lipitor  80 mg daily.  6. Lisinopril 20 mg daily.  7. Demadex 20 mg daily.  8. Xanax 0.25 mg as needed.  9. Also the patient was written a prescription for Ultram 50 mg to      take every 6 hours as needed for pain.   FOLLOWUP CARE INSTRUCTIONS FOR DISCHARGE:  The patient's discharge  instructions were given and discussed to her on December 19, 2007 with  indication for the patient to discharge today as long as she continues  to remain afebrile and vitals remain stable and the patient is able to  ambulate okay.  It was discussed with the patient to clean the wound  with soap and water.  Also discussed with the patient she may increase  activity slowly and walk with assistance and may shower and bathe.  The  patient was told that no extensive lifting for 2 weeks and no driving  for 2 weeks.  The patient is also instructed to contact the office or ER  if she notices any drainage, increased redness or increased swelling  from the incision site, fever greater than 101 or any severe abdominal  pressure.  The patient was also instructed that she needs to return to  Dr. Madilyn Fireman for a followup appointment in 1 month and this will also  involve a stent protocol where she will have a CT scan of the abdominal.  The patient was also instructed to continue to resume home medications  and that she may take Ultram 50 mg 1 tablet every 6 hours as needed for  pain.  The patient agreed to these instructions.  The patient stated  that she understood these instructions and the patient stated that she  was in agreement to being discharged to home as long as she continued to  remain afebrile and vitals remained stable.   Plan to discharge on December 19, 2007 as long as the patient continues  to remain stable.   LABORATORY DATA:  White blood count was 14.7, hemoglobin 11.8,  hematocrit 35, platelets 233.  Sodium 137, potassium 3.5, chloride 102,  bicarb 29, BUN 9, creatinine 1.02, glucose 170, calcium 8.4.       Cyndy Freeze, PA      P. Liliane Bade, M.D.  Electronically Signed    ALW/MEDQ  D:  12/19/2007  T:  12/19/2007  Job:  [id number]

## 2011-04-22 ENCOUNTER — Other Ambulatory Visit: Payer: Self-pay | Admitting: Vascular Surgery

## 2011-04-22 DIAGNOSIS — I714 Abdominal aortic aneurysm, without rupture, unspecified: Secondary | ICD-10-CM

## 2011-05-04 ENCOUNTER — Encounter: Payer: Self-pay | Admitting: Cardiology

## 2011-05-13 ENCOUNTER — Other Ambulatory Visit: Payer: Self-pay | Admitting: *Deleted

## 2011-05-14 ENCOUNTER — Encounter: Payer: Self-pay | Admitting: Cardiology

## 2011-05-18 ENCOUNTER — Encounter: Payer: Self-pay | Admitting: Cardiology

## 2011-05-18 ENCOUNTER — Ambulatory Visit (INDEPENDENT_AMBULATORY_CARE_PROVIDER_SITE_OTHER): Payer: Medicare Other | Admitting: Cardiology

## 2011-05-18 DIAGNOSIS — I259 Chronic ischemic heart disease, unspecified: Secondary | ICD-10-CM

## 2011-05-18 DIAGNOSIS — E785 Hyperlipidemia, unspecified: Secondary | ICD-10-CM

## 2011-05-18 DIAGNOSIS — I119 Hypertensive heart disease without heart failure: Secondary | ICD-10-CM | POA: Insufficient documentation

## 2011-05-18 DIAGNOSIS — Z9889 Other specified postprocedural states: Secondary | ICD-10-CM

## 2011-05-18 DIAGNOSIS — Z87448 Personal history of other diseases of urinary system: Secondary | ICD-10-CM

## 2011-05-18 DIAGNOSIS — Z8679 Personal history of other diseases of the circulatory system: Secondary | ICD-10-CM | POA: Insufficient documentation

## 2011-05-18 DIAGNOSIS — E782 Mixed hyperlipidemia: Secondary | ICD-10-CM | POA: Insufficient documentation

## 2011-05-18 NOTE — Progress Notes (Signed)
Megan [name] Date of Birth:  01/11/1946 [facility] / [facility] [address]   [address] [place], [place]  [address] [phone]           Fax   [phone]  HPI: This pleasant 66 year old woman is seen for a four-month followup office visit.  She has a history of known ischemic heart disease she had coronary artery bypass graft surgery in 1995.  In 2008 she underwent treatment for renal artery stenosis with a renal stent.  In 2009 she had stenting of an abdominal aortic aneurysm.  She's had a history of hypertension and hyperlipidemia.  She previously smoked cigarettes.  She quit smoking about 13 years ago.  She has hypercholesterolemia and is now on generic Lipitor.  She has not been expressing any chest pain or shortness of breath or palpitations or dizziness or syncope.  Current Outpatient Prescriptions  Medication Sig Dispense Refill  . ALPRAZolam (XANAX) 0.25 MG tablet Take 0.25 mg by mouth as needed.        [name] aspirin 325 MG tablet Take 325 mg by mouth daily.        [name] atorvastatin (LIPITOR) 40 MG tablet Take 40 mg by mouth daily.        . Cholecalciferol (VITAMIN D3) 5000 UNITS TABS Take by mouth daily.        . clotrimazole (MYCELEX) 10 MG troche Take 10 mg by mouth as needed.        [name] ketoconazole (NIZORAL) 2 % shampoo Apply 1 application topically as needed.        [name] losartan-hydrochlorothiazide (HYZAAR) 100-12.5 MG per tablet Take 1 tablet by mouth daily.        . nitroGLYCERIN (NITROSTAT) 0.4 MG SL tablet Place 0.4 mg under the tongue every 5 (five) minutes as needed.        [name] omeprazole (PRILOSEC) 20 MG capsule Take 1/2 tablet daily        . PRENATAL VITAMINS PO Take 1 tablet by mouth daily.        [name] sulfamethoxazole-trimethoprim (BACTRIM DS,SEPTRA DS) 800-160 MG per tablet Take 1 tablet by mouth as needed.        . torsemide (DEMADEX) 20 MG tablet 1/2 daily or as directed  30 tablet  11  . DISCONTD: Cholecalciferol (VITAMIN D PO) Take by mouth daily.         [name] DISCONTD: QUININE SULFATE PO Take 200 mg by mouth as needed.        [name] DISCONTD: rosuvastatin (CRESTOR) 20 MG tablet Take 20 mg by mouth daily.          Allergies  Allergen Reactions  . Bextra (Valdecoxib)     Increased lft's  . Codeine     REACTION: rash, itching  . Lipitor (Atorvastatin Calcium)     Muscle aches   . Lisinopril Cough  . Mevacor (Lovastatin)     "arthritis"  . Plavix (Clopidogrel Bisulfate)     Itching of hands and feet  . Zocor (Simvastatin)     Bones hurt   . Tussionex Pennkinetic Er Rash    Patient Active Problem List  Diagnoses  . Benign hypertensive heart disease without heart failure  . Dyslipidemia  . Ischemic heart disease  . History of abdominal aortic aneurysm repair  . History of renal artery stenosis    History  Smoking status  . Former Smoker  . Quit date: 12/06/1996  Smokeless tobacco  . Not on file  History  Alcohol Use: Not on file    No family history on file.  Review of Systems: The patient denies any heat or cold intolerance.  No weight gain or weight loss.  The patient denies headaches or blurry vision.  There is no cough or sputum production.  The patient denies dizziness.  There is no hematuria or hematochezia.  The patient denies any muscle aches or arthritis.  The patient denies any rash.  The patient denies frequent falling or instability.  There is no history of depression or anxiety.  All other systems were reviewed and are negative.   Physical Exam: Filed Vitals:   05/18/11 1341  BP: 120/60  Pulse: 72  The general appearance reveals a well-developed well-nourished woman in no distress.Pupils equal and reactive.   Extraocular Movements are full.  There is no scleral icterus.  The mouth and pharynx are normal.  The neck is supple.  The carotids reveal no bruits.  The jugular venous pressure is normal.  The thyroid is not enlarged.  There is no lymphadenopathy.The chest is clear to percussion and auscultation.  There are no rales or rhonchi. Expansion of the chest is symmetrical.The precordium is quiet.  The first heart sound is normal.  The second heart sound is physiologically split.  There is no murmur gallop rub or click.  There is no abnormal lift or heave.The abdomen is soft and nontender. Bowel sounds are normal. The liver and spleen are not enlarged. There Are no abdominal masses. There are no bruits. Normal extremity without phlebitis or edema.  Pedal pulses are present.The skin is warm and dry.  There is no rash.    Assessment / Plan: Continue same medication.  Try to lose weight. Followup in 4 months for an office visit. And she'll get lab work ahead of time

## 2011-05-18 NOTE — Assessment & Plan Note (Signed)
The patient has a past history of known ischemic heart disease.  She underwent coronary artery bypass graft surgery in 1995.  Her last nuclear stress test was 11/11/09 and showed an ejection fraction of 86% and no evidence of ischemia.  She stays physically reactive and has not been experiencing any exertional chest discomfort.

## 2011-05-18 NOTE — Assessment & Plan Note (Signed)
The patient has a history of dyslipidemia.  She had recent blood work On 05/04/11 which showed a normal cholesterol of 158 triglycerides 170 HDL 49 and LDL 75.  Her ALT was elevated at 55 and she does drink moderate alcohol.  She will cut back on her alcohol

## 2011-05-18 NOTE — Assessment & Plan Note (Signed)
The patient has a past history of essential hypertension she has been checking her blood pressures and they have been satisfactory recently.  She has not been experiencing any headaches or dizzy spells .

## 2011-06-07 ENCOUNTER — Other Ambulatory Visit: Payer: Self-pay | Admitting: Family Medicine

## 2011-06-07 DIAGNOSIS — Z1231 Encounter for screening mammogram for malignant neoplasm of breast: Secondary | ICD-10-CM

## 2011-06-14 ENCOUNTER — Ambulatory Visit: Payer: Medicare Other

## 2011-06-16 ENCOUNTER — Ambulatory Visit
Admission: RE | Admit: 2011-06-16 | Discharge: 2011-06-16 | Disposition: A | Discharge status: Home / Self Care | Payer: Medicare Other | Source: Ambulatory Visit | Attending: Family Medicine | Admitting: Family Medicine

## 2011-06-16 DIAGNOSIS — Z1231 Encounter for screening mammogram for malignant neoplasm of breast: Secondary | ICD-10-CM

## 2011-07-22 ENCOUNTER — Encounter: Payer: Self-pay | Admitting: Vascular Surgery

## 2011-08-04 ENCOUNTER — Other Ambulatory Visit: Payer: Self-pay | Admitting: Vascular Surgery

## 2011-08-04 LAB — CREATININE, SERUM: Creat: 0.84 mg/dL (ref 0.50–1.10)

## 2011-08-04 LAB — BUN: BUN: 14 mg/dL (ref 6–23)

## 2011-08-06 ENCOUNTER — Other Ambulatory Visit: Payer: Self-pay | Admitting: *Deleted

## 2011-08-16 ENCOUNTER — Encounter: Payer: Self-pay | Admitting: Vascular Surgery

## 2011-08-17 ENCOUNTER — Ambulatory Visit
Admission: RE | Admit: 2011-08-17 | Discharge: 2011-08-17 | Disposition: A | Discharge status: Home / Self Care | Payer: Medicare Other | Source: Ambulatory Visit | Attending: Vascular Surgery | Admitting: Vascular Surgery

## 2011-08-17 ENCOUNTER — Ambulatory Visit (INDEPENDENT_AMBULATORY_CARE_PROVIDER_SITE_OTHER): Payer: Medicare Other | Admitting: Vascular Surgery

## 2011-08-17 ENCOUNTER — Encounter: Payer: Self-pay | Admitting: Vascular Surgery

## 2011-08-17 VITALS — BP 150/80 | HR 66 | Resp 16 | Ht 61.0 in | Wt 161.0 lb

## 2011-08-17 DIAGNOSIS — Z48812 Encounter for surgical aftercare following surgery on the circulatory system: Secondary | ICD-10-CM

## 2011-08-17 DIAGNOSIS — I701 Atherosclerosis of renal artery: Secondary | ICD-10-CM

## 2011-08-17 DIAGNOSIS — I714 Abdominal aortic aneurysm, without rupture, unspecified: Secondary | ICD-10-CM

## 2011-08-17 MED ORDER — IOHEXOL 350 MG/ML SOLN
100.0000 mL | Freq: Once | INTRAVENOUS | Status: AC | PRN
  Administered 2011-08-17: 100 mL via INTRAVENOUS

## 2011-08-17 NOTE — Progress Notes (Signed)
Addended by: Judy Pimple on: 08/17/2011 02:21 PM   Modules accepted: Orders

## 2011-08-17 NOTE — Progress Notes (Signed)
The patient presents today for followup of diffuse peripheral vascular occlusive disease. She is status post stent graft repair of abdominal aortic aneurysm in January of 2009. She is status post right renal artery stenting in November of 2008. She has done well with no new major medical difficulties. She does have a history of coronary artery disease with bypass in the stable from a cardiac standpoint. He has well-controlled hypertension she is not diabetic. She does report concern of fatigue in her legs with walking particularly a pillar of stairs. This is relieved by rest.  Past Medical History  Diagnosis Date  . Coronary artery disease   . Renal artery stenosis   . Hypertension   . Shortness of breath on exertion   . Hyperlipidemia   . Osteoarthritis   . Fibromyalgia   . Eczema, dyshidrotic     History  Substance Use Topics  . Smoking status: Former Smoker    Quit date: 12/07/1995  . Smokeless tobacco: Never Used  . Alcohol Use: 4.0 oz/week    8 drink(s) per week     8 drinks per wwek    Family History  Problem Relation Age of Onset  . Heart disease Mother   . Heart disease Father   . Heart disease Sister   . Diabetes Sister   . Heart disease Brother   . Diabetes Brother     Allergies  Allergen Reactions  . Bextra (Valdecoxib)     Increased lft's  . Codeine     REACTION: rash, itching  . Lipitor (Atorvastatin Calcium)     Muscle aches   . Lisinopril Cough  . Mevacor (Lovastatin)     "arthritis"  . Morphine And Related Nausea Only  . Plavix (Clopidogrel Bisulfate)     Itching of hands and feet  . Zocor (Simvastatin)     Bones hurt   . Tussionex Pennkinetic Er Rash    Current outpatient prescriptions:ALPRAZolam (XANAX) 0.25 MG tablet, Take 0.25 mg by mouth as needed.  , Disp: , Rfl: ;  aspirin 325 MG tablet, Take 325 mg by mouth daily.  , Disp: , Rfl: ;  atorvastatin (LIPITOR) 40 MG tablet, Take 40 mg by mouth daily.  , Disp: , Rfl: ;  Cholecalciferol (VITAMIN  D3) 5000 UNITS TABS, Take by mouth daily.  , Disp: , Rfl: ;  clotrimazole (MYCELEX) 10 MG troche, Take 10 mg by mouth as needed.  , Disp: , Rfl:  losartan-hydrochlorothiazide (HYZAAR) 100-12.5 MG per tablet, Take 1 tablet by mouth daily.  , Disp: , Rfl: ;  nitroGLYCERIN (NITROSTAT) 0.4 MG SL tablet, Place 0.4 mg under the tongue every 5 (five) minutes as needed.  , Disp: , Rfl: ;  omeprazole (PRILOSEC) 20 MG capsule, Take 1/2 tablet daily  , Disp: , Rfl: ;  PRENATAL VITAMINS PO, Take 1 tablet by mouth daily.  , Disp: , Rfl:  sulfamethoxazole-trimethoprim (BACTRIM DS,SEPTRA DS) 800-160 MG per tablet, Take 1 tablet by mouth as needed.  , Disp: , Rfl: ;  torsemide (DEMADEX) 20 MG tablet, 1/2 daily or as directed, Disp: 30 tablet, Rfl: 11;  ketoconazole (NIZORAL) 2 % shampoo, Apply 1 application topically as needed. , Disp: , Rfl:  No current facility-administered medications for this visit. Facility-Administered Medications Ordered in Other Visits: iohexol (OMNIPAQUE) 350 MG/ML injection 100 mL, 100 mL, Intravenous, Once PRN, Medication Radiologist, 100 mL at 08/17/11 0836  BP 150/80  Pulse 66  Resp 16  Ht 5\' 1"  (1.549 m)  Wt 161  lb (73.029 kg)  BMI 30.42 kg/m2  Body mass index is 30.42 kg/(m^2).      Review of systems no change from her prior study.  Physical exam: Well-developed well-nourished white female no acute distress. HEENT normal. Lungs clear bilaterally. Cardiovascular heart regular rate and rhythm, carotid without bruits, 2+ radial femoral and 2+ dorsalis pedis pulses bilaterally. Abdomen soft nontender no masses and no aneurysm palpable. Extremities no deformities. Neurologic for no focal weakness.  CT abdomen and pelvis: Continues shrinking of native aneurysm sac to 4.0 cm, no endoleak. Widely patent iliac and femoral vessels.  Duplex renal arteries. No evidence of renal artery stenosis.  Assessment and plan: Successful repair of abdominal aortic aneurysm with stent graft.  Repeat CT scan in 2 years. No evidence of renal artery stenosis. Continued yearly surveillance of right renal artery stent with duplex

## 2011-08-23 ENCOUNTER — Telehealth: Payer: Self-pay | Admitting: Cardiology

## 2011-08-23 NOTE — Telephone Encounter (Signed)
Pt calling asking that Susie call her back. Please return call.

## 2011-08-23 NOTE — Telephone Encounter (Signed)
Liver enzymes elevated at ov with Dr. Evlyn Kanner.He advised stay off cholesterol meds. And all alcohol and re-check labs with him in three weeks.They will forward [place] copies of all labs

## 2011-08-24 NOTE — Telephone Encounter (Signed)
I agree with a plan regarding her elevated liver function studies

## 2011-08-26 LAB — CBC
HCT: 35 — ABNORMAL LOW
HCT: 43
Hemoglobin: 11.8 — ABNORMAL LOW
Hemoglobin: 14.4
MCHC: 33.5
MCHC: 33.7
MCV: 89.6
MCV: 90.1
Platelets: 223
Platelets: 342
RBC: 3.89
RBC: 4.8
RDW: 12.3
RDW: 12.8
WBC: 14.7 — ABNORMAL HIGH
WBC: 7.6

## 2011-08-26 LAB — BLOOD GAS, ARTERIAL
Acid-Base Excess: 0.8
Bicarbonate: 23.8
Drawn by: 181601
FIO2: 0.21
O2 Saturation: 98
Patient temperature: 98.6
TCO2: 24.8
pCO2 arterial: 31.6 — ABNORMAL LOW
pH, Arterial: 7.49 — ABNORMAL HIGH
pO2, Arterial: 100

## 2011-08-26 LAB — BASIC METABOLIC PANEL
BUN: 9
CO2: 29
Calcium: 8.4
Chloride: 102
Creatinine, Ser: 1.02
GFR calc Af Amer: >60
GFR calc non Af Amer: 55 — ABNORMAL LOW
Glucose, Bld: 170 — ABNORMAL HIGH
Potassium: 3.5
Sodium: 137

## 2011-08-26 LAB — COMPREHENSIVE METABOLIC PANEL
ALT: 57 — ABNORMAL HIGH
AST: 53 — ABNORMAL HIGH
Albumin: 3.6
Alkaline Phosphatase: 94
BUN: 14
CO2: 28
Calcium: 10.4
Chloride: 102
Creatinine, Ser: 1.25 — ABNORMAL HIGH
GFR calc Af Amer: 52 — ABNORMAL LOW
GFR calc non Af Amer: 43 — ABNORMAL LOW
Glucose, Bld: 116 — ABNORMAL HIGH
Potassium: 5.1
Sodium: 136
Total Bilirubin: 0.3
Total Protein: 6.3

## 2011-08-26 LAB — TYPE AND SCREEN
ABO/RH(D): O POS
Antibody Screen: NEGATIVE

## 2011-08-26 LAB — URINALYSIS, ROUTINE W REFLEX MICROSCOPIC
Bilirubin Urine: NEGATIVE
Glucose, UA: NEGATIVE
Hgb urine dipstick: NEGATIVE
Ketones, ur: 15 — AB
Nitrite: NEGATIVE
Protein, ur: NEGATIVE
Specific Gravity, Urine: 1.017
Urobilinogen, UA: 0.2
pH: 6.5

## 2011-08-26 LAB — ABO/RH: ABO/RH(D): O POS

## 2011-08-26 LAB — PROTIME-INR
INR: 1
Prothrombin Time: 13.7

## 2011-08-26 LAB — APTT: aPTT: 34

## 2011-09-01 NOTE — Procedures (Unsigned)
RENAL ARTERY DUPLEX EVALUATION  INDICATION:  Followup renal artery stenosis.  HISTORY: Diabetes:  No. Cardiac:  CABG, CAD. Hypertension:  Yes. Smoking:  Previous.  RENAL ARTERY DUPLEX FINDINGS: Aorta-Proximal:  66 cm/s Aorta-Mid:  85 cm/s Aorta-Distal:  (endograft) right limb 54, left limb 57 cm/s Celiac Artery Origin:  181 cm/s SMA Origin:  451/103 cm/s                                   RIGHT               LEFT Renal Artery Origin:             124 cm/s            222 cm/s Renal Artery Proximal:           103 cm/s            189 cm/s Renal Artery Mid:                175 cm/s            112 cm/s Renal Artery Distal:             175 cm/s            90 cm/s Hilar Acceleration Time (AT): Renal-Aortic Ratio (RAR):        2.06                2.61 Kidney Size:                     9.06 cm             10.7 cm End Diastolic Ratio (EDR): Resistive Index (RI):            0.75/0.81           0.67/0.79  IMPRESSION: 1. Abdominal aortic aneurysm sac size of 4.0 x 4.0 cm with the     previous sac size noted as 3.61 x 3.89 cm. 2. 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 3. Bilateral renal artery ratios are <3.5 and considered to be in the     0%-59% stenosis range. 4. Right renal artery stent is not well visualized due to bowel gas     and patient body habitus. 5. Improvement since previous study on 02/24/2010.  ___________________________________________ Larina Earthly, M.D.  SH/MEDQ  D:  08/17/2011  T:  08/17/2011  Job:  [id number]

## 2011-09-10 ENCOUNTER — Telehealth: Payer: Self-pay | Admitting: Cardiology

## 2011-09-10 NOTE — Telephone Encounter (Signed)
Continue off alcohol and ibuprofen, and statin and we will see what her cholesterol is when we check her at the end of October.  In the meantime, be very careful with diet and try to lose weight and avoid high cholesterol foods.

## 2011-09-10 NOTE — Telephone Encounter (Signed)
At Dr Rinaldo Cloud office AST was 62, ALT 81, and cholesterol was 169.  Did discontinue statin, alcohol, and ibuprofen.  Yesterday recheck was AST 33, ALT 60, and cholesterol 250.  Please advise on what to do with cholesterol meds.  Has labs scheduled here on 10/30 and ov in november

## 2011-09-10 NOTE — Telephone Encounter (Signed)
Left message

## 2011-09-10 NOTE — Telephone Encounter (Signed)
Pt returning call to [place]. Please call pt back.

## 2011-09-10 NOTE — Telephone Encounter (Signed)
Patient calling, discuss medication changed. Pt wishes for susan to call her back.

## 2011-09-10 NOTE — Telephone Encounter (Signed)
Advised patient

## 2011-09-14 LAB — CBC
HCT: 37.2
HCT: 41.5
Hemoglobin: 12.8
Hemoglobin: 14.1
MCHC: 33.9
MCHC: 34.3
MCV: 89.7
MCV: 90.3
Platelets: 196
Platelets: 213
RBC: 4.13
RBC: 4.62
RDW: 13
RDW: 13.2
WBC: 6.2
WBC: 6.5

## 2011-09-14 LAB — COMPREHENSIVE METABOLIC PANEL
ALT: 64 — ABNORMAL HIGH
AST: 43 — ABNORMAL HIGH
Albumin: 3.8
Alkaline Phosphatase: 66
BUN: 11
CO2: 30
Calcium: 10
Chloride: 102
Creatinine, Ser: 0.84
GFR calc Af Amer: >60
GFR calc non Af Amer: >60
Glucose, Bld: 101 — ABNORMAL HIGH
Potassium: 3.8
Sodium: 140
Total Bilirubin: 0.7
Total Protein: 5.9 — ABNORMAL LOW

## 2011-09-14 LAB — BASIC METABOLIC PANEL
BUN: 12
CO2: 29
Calcium: 9
Chloride: 103
Creatinine, Ser: 0.91
GFR calc Af Amer: >60
GFR calc non Af Amer: >60
Glucose, Bld: 173 — ABNORMAL HIGH
Potassium: 3.2 — ABNORMAL LOW
Sodium: 138

## 2011-09-14 LAB — PROTIME-INR
INR: 0.9
Prothrombin Time: 12.8

## 2011-09-14 LAB — APTT: aPTT: 33

## 2011-10-05 ENCOUNTER — Other Ambulatory Visit: Payer: Medicare Other | Admitting: *Deleted

## 2011-10-13 ENCOUNTER — Ambulatory Visit: Payer: Medicare Other | Admitting: Cardiology

## 2011-10-14 ENCOUNTER — Other Ambulatory Visit (INDEPENDENT_AMBULATORY_CARE_PROVIDER_SITE_OTHER): Payer: Medicare Other | Admitting: *Deleted

## 2011-10-14 ENCOUNTER — Other Ambulatory Visit: Payer: Self-pay | Admitting: Cardiology

## 2011-10-14 DIAGNOSIS — I10 Essential (primary) hypertension: Secondary | ICD-10-CM

## 2011-10-14 DIAGNOSIS — E7800 Pure hypercholesterolemia, unspecified: Secondary | ICD-10-CM

## 2011-10-14 LAB — HEPATIC FUNCTION PANEL
ALT: 58 U/L — ABNORMAL HIGH (ref 0–35)
AST: 46 U/L — ABNORMAL HIGH (ref 0–37)
Albumin: 3.8 g/dL (ref 3.5–5.2)
Alkaline Phosphatase: 67 U/L (ref 39–117)
Bilirubin, Direct: 0 mg/dL (ref 0.0–0.3)
Total Bilirubin: 0.6 mg/dL (ref 0.3–1.2)
Total Protein: 6.4 g/dL (ref 6.0–8.3)

## 2011-10-14 LAB — BASIC METABOLIC PANEL
BUN: 17 mg/dL (ref 6–23)
CO2: 27 mEq/L (ref 19–32)
Calcium: 9.3 mg/dL (ref 8.4–10.5)
Chloride: 106 mEq/L (ref 96–112)
Creatinine, Ser: 0.7 mg/dL (ref 0.4–1.2)
GFR: 83.17 mL/min (ref >60.00)
Glucose, Bld: 109 mg/dL — ABNORMAL HIGH (ref 70–99)
Potassium: 4 mEq/L (ref 3.5–5.1)
Sodium: 143 mEq/L (ref 135–145)

## 2011-10-14 LAB — LDL CHOLESTEROL, DIRECT: Direct LDL: 196.5 mg/dL

## 2011-10-14 LAB — LIPID PANEL
Cholesterol: 256 mg/dL — ABNORMAL HIGH (ref 0–200)
HDL: 62.4 mg/dL (ref >39.00)
Total CHOL/HDL Ratio: 4
Triglycerides: 123 mg/dL (ref 0.0–149.0)
VLDL: 24.6 mg/dL (ref 0.0–40.0)

## 2011-10-18 ENCOUNTER — Encounter: Payer: Self-pay | Admitting: Cardiology

## 2011-10-20 ENCOUNTER — Ambulatory Visit (INDEPENDENT_AMBULATORY_CARE_PROVIDER_SITE_OTHER): Payer: Medicare Other | Admitting: Cardiology

## 2011-10-20 ENCOUNTER — Encounter: Payer: Self-pay | Admitting: Cardiology

## 2011-10-20 VITALS — BP 130/78 | HR 78 | Ht 61.0 in | Wt 162.0 lb

## 2011-10-20 DIAGNOSIS — I259 Chronic ischemic heart disease, unspecified: Secondary | ICD-10-CM

## 2011-10-20 DIAGNOSIS — E785 Hyperlipidemia, unspecified: Secondary | ICD-10-CM

## 2011-10-20 DIAGNOSIS — I119 Hypertensive heart disease without heart failure: Secondary | ICD-10-CM

## 2011-10-20 MED ORDER — ATORVASTATIN CALCIUM 20 MG PO TABS: 20.0000 mg | ORAL_TABLET | Freq: Every day | ORAL | Status: DC

## 2011-10-20 NOTE — Progress Notes (Signed)
Carolynn Serve Date of Birth:  [birth year] [facility] / [facility] [address]   [address] [place], [place]  [address] [phone]           Fax   [phone]  HPI: This pleasant 65 year old woman is seen for a scheduled followup office visit.  He has a history of known ischemic heart disease.  She underwent coronary bypass graft surgery in 1995.  She had a renal artery stent in 2008 for renal artery stenosis.  She had a stent graft of an abdominal aortic aneurysm 2001.  She's had a past history of high blood pressure and hyperlipidemia.  Since last visit she has been feeling well.  She has had some elevated liver function test abnormalities and was off alcohol for a while but now is back on a modest intake of wine.  Current Outpatient Prescriptions  Medication Sig Dispense Refill  . ALPRAZolam (XANAX) 0.25 MG tablet Take 0.25 mg by mouth as needed.        [name] aspirin 325 MG tablet Take 325 mg by mouth daily.        . Cholecalciferol (VITAMIN D3) 5000 UNITS TABS Take by mouth daily. Taking 13086 units weekly      . clotrimazole (MYCELEX) 10 MG troche Take 10 mg by mouth as needed.        . Fluticasone-Salmeterol (ADVAIR DISKUS) 250-50 MCG/DOSE AEPB Inhale 2 puffs into the lungs every 12 (twelve) hours.        [name] losartan-hydrochlorothiazide (HYZAAR) 100-12.5 MG per tablet Take 1 tablet by mouth daily.        . nitroGLYCERIN (NITROSTAT) 0.4 MG SL tablet Place 0.4 mg under the tongue every 5 (five) minutes as needed.        [name] omeprazole (PRILOSEC) 20 MG capsule 20 mg daily.       [name] PRENATAL VITAMINS PO Take 1 tablet by mouth daily.        [name] sulfamethoxazole-trimethoprim (BACTRIM DS,SEPTRA DS) 800-160 MG per tablet Take 1 tablet by mouth as needed.        . torsemide (DEMADEX) 20 MG tablet 1/2 daily or as directed  30 tablet  11  . atorvastatin (LIPITOR) 20 MG tablet Take 1 tablet (20 mg total) by mouth daily.  30 tablet  11    Allergies  Allergen Reactions  . Bextra  (Valdecoxib)     Increased lft's  . Codeine     REACTION: rash, itching  . Lipitor (Atorvastatin Calcium)     Muscle aches   . Lisinopril Cough  . Mevacor (Lovastatin)     "arthritis"  . Morphine And Related Nausea Only  . Plavix (Clopidogrel Bisulfate)     Itching of hands and feet  . Zocor (Simvastatin)     Bones hurt   . Tussionex Pennkinetic Er Rash    Patient Active Problem List  Diagnoses  . Benign hypertensive heart disease without heart failure  . Dyslipidemia  . Ischemic heart disease  . History of abdominal aortic aneurysm repair  . History of renal artery stenosis    History  Smoking status  . Former Smoker  . Quit date: 12/07/1995  Smokeless tobacco  . Never Used    History  Alcohol Use  . 4.0 oz/week  . 8 drink(s) per week    8 drinks per wwek    Family History  Problem Relation Age of Onset  . Heart disease Mother   . Heart disease Father   .  Heart disease Sister   . Diabetes Sister   . Heart disease Brother   . Diabetes Brother     Review of Systems: The patient denies any heat or cold intolerance.  No weight gain or weight loss.  The patient denies headaches or blurry vision.  There is no cough or sputum production.  The patient denies dizziness.  There is no hematuria or hematochezia.  The patient denies any muscle aches or arthritis.  The patient denies any rash.  The patient denies frequent falling or instability.  There is no history of depression or anxiety.  All other systems were reviewed and are negative.   Physical Exam: Filed Vitals:   10/20/11 0842  BP: 130/78  Pulse: 78   The patient appears to be in no distress.  Head and neck exam reveals that the pupils are equal and reactive.  The extraocular movements are full.  There is no scleral icterus.  Mouth and pharynx are benign.  No lymphadenopathy.  No carotid bruits.  The jugular venous pressure is normal.  Thyroid is not enlarged or tender.  Chest is clear to percussion  and auscultation.  No rales or rhonchi.  Expansion of the chest is symmetrical.  Heart reveals no abnormal lift or heave.  First and second heart sounds are normal.  There is no murmur gallop rub or click.  The abdomen is soft and nontender.  Bowel sounds are normoactive.  There is no hepatosplenomegaly or mass.  There are no abdominal bruits.  Extremities reveal no phlebitis or edema.  Pedal pulses are good.  There is no cyanosis or clubbing.  Neurologic exam is normal strength and no lateralizing weakness.  No sensory deficits.  Integument reveals no rash    Assessment / Plan: Will start low-dose Lipitor 20 mg daily because of very high LDL levels.  Recheck labs in 2 months and recheck for office visit and labs and 6 months

## 2011-10-20 NOTE — Assessment & Plan Note (Signed)
No headaches or dizzy spells.  We checked a oxygen saturation today and it is 96% at rest and is 93% with walking, this is acceptable

## 2011-10-20 NOTE — Assessment & Plan Note (Signed)
Patient has been off Lipitor since last visit and her lipids are very high today.  Her liver function studies are still slightly high suggesting that it was not the Lipitor which was causing the problem.  We will start her back on low-dose Lipitor 20 mg daily and have her return in 2 months for fasting lab work including lipid panel hepatic function panel in the basal metabolic panel.

## 2011-10-20 NOTE — Assessment & Plan Note (Signed)
The patient walks regularly for exercise.  She is not expressing any recurrent angina pectoris

## 2011-10-20 NOTE — Patient Instructions (Signed)
Resume Lipitor at 20 mg daily, will recheck fasting labs in 2 months (lp/hfp/bmet)  Your physician wants you to follow-up in: 6 months with fasting labs You will receive a reminder letter in the mail two months in advance. If you don't receive a letter, please call our office to schedule the follow-up appointment.

## 2011-12-20 ENCOUNTER — Other Ambulatory Visit: Payer: Medicare Other | Admitting: *Deleted

## 2012-01-03 ENCOUNTER — Telehealth: Payer: Self-pay | Admitting: *Deleted

## 2012-01-03 ENCOUNTER — Other Ambulatory Visit (INDEPENDENT_AMBULATORY_CARE_PROVIDER_SITE_OTHER): Payer: Medicare Other | Admitting: *Deleted

## 2012-01-03 DIAGNOSIS — E785 Hyperlipidemia, unspecified: Secondary | ICD-10-CM

## 2012-01-03 DIAGNOSIS — I119 Hypertensive heart disease without heart failure: Secondary | ICD-10-CM

## 2012-01-03 LAB — HEPATIC FUNCTION PANEL
ALT: 57 U/L — ABNORMAL HIGH (ref 0–35)
AST: 38 U/L — ABNORMAL HIGH (ref 0–37)
Albumin: 3.9 g/dL (ref 3.5–5.2)
Alkaline Phosphatase: 70 U/L (ref 39–117)
Bilirubin, Direct: 0.1 mg/dL (ref 0.0–0.3)
Total Bilirubin: 0.5 mg/dL (ref 0.3–1.2)
Total Protein: 6.4 g/dL (ref 6.0–8.3)

## 2012-01-03 LAB — BASIC METABOLIC PANEL
BUN: 16 mg/dL (ref 6–23)
CO2: 31 mEq/L (ref 19–32)
Calcium: 9.5 mg/dL (ref 8.4–10.5)
Chloride: 103 mEq/L (ref 96–112)
Creatinine, Ser: 1 mg/dL (ref 0.4–1.2)
GFR: 60.1 mL/min (ref >60.00)
Glucose, Bld: 115 mg/dL — ABNORMAL HIGH (ref 70–99)
Potassium: 3.8 mEq/L (ref 3.5–5.1)
Sodium: 141 mEq/L (ref 135–145)

## 2012-01-03 LAB — LIPID PANEL
Cholesterol: 180 mg/dL (ref 0–200)
HDL: 54.3 mg/dL (ref >39.00)
LDL Cholesterol: 97 mg/dL (ref 0–99)
Total CHOL/HDL Ratio: 3
Triglycerides: 144 mg/dL (ref 0.0–149.0)
VLDL: 28.8 mg/dL (ref 0.0–40.0)

## 2012-01-03 NOTE — Telephone Encounter (Signed)
Message copied by Burnell Blanks on Mon Jan 03, 2012  3:27 PM ------      Message from: Cassell Clement      Created: Mon Jan 03, 2012  2:40 PM       Please report.  The lipids are much better since starting back on low-dose Lipitor 20 mg daily.  The liver functions are slightly improved as well.  Continue current medication

## 2012-01-03 NOTE — Telephone Encounter (Signed)
Mailed copy as requested.  Will also send to Dr Duanne Guess as requested

## 2012-01-06 ENCOUNTER — Telehealth: Payer: Self-pay | Admitting: Cardiology

## 2012-01-06 NOTE — Telephone Encounter (Signed)
Spoke with patient and will resend to Dr Duanne Guess

## 2012-01-06 NOTE — Telephone Encounter (Signed)
New Msg: Pt calling wanting to speak with nurse regarding blood work faxed to pt GP. Pt said blood work didn't fax well and pt wants to know if nurse can email the information. Please return pt call to discuss further.   Alt #: [phone]

## 2012-03-02 ENCOUNTER — Other Ambulatory Visit: Payer: Self-pay | Admitting: Cardiology

## 2012-03-14 ENCOUNTER — Telehealth: Payer: Self-pay | Admitting: Cardiology

## 2012-03-14 NOTE — Telephone Encounter (Signed)
Coming next week for lipid, bmet, and hfp and for ov

## 2012-03-14 NOTE — Telephone Encounter (Signed)
New Problem:     Patient called in wanting you to work her in to see Dr. Patty Sermons because she was discharged from the hospital yesterday morning for low potassium (I have scheduled her appointments already) and the orders placed for her blood work (patient stated that they want everything Dr. Patty Sermons normally does in addition to a BMP).  Please call back if you have any questions.

## 2012-03-20 ENCOUNTER — Other Ambulatory Visit (INDEPENDENT_AMBULATORY_CARE_PROVIDER_SITE_OTHER): Payer: Medicare Other

## 2012-03-20 DIAGNOSIS — I119 Hypertensive heart disease without heart failure: Secondary | ICD-10-CM

## 2012-03-20 DIAGNOSIS — E785 Hyperlipidemia, unspecified: Secondary | ICD-10-CM

## 2012-03-20 DIAGNOSIS — I259 Chronic ischemic heart disease, unspecified: Secondary | ICD-10-CM

## 2012-03-20 LAB — BASIC METABOLIC PANEL WITH GFR
BUN: 13 mg/dL (ref 6–23)
CO2: 23 meq/L (ref 19–32)
Calcium: 9.5 mg/dL (ref 8.4–10.5)
Chloride: 108 meq/L (ref 96–112)
Creatinine, Ser: 0.7 mg/dL (ref 0.4–1.2)
GFR: 88.56 mL/min (ref >60.00)
Glucose, Bld: 111 mg/dL — ABNORMAL HIGH (ref 70–99)
Potassium: 4.1 meq/L (ref 3.5–5.1)
Sodium: 141 meq/L (ref 135–145)

## 2012-03-20 LAB — HEPATIC FUNCTION PANEL
ALT: 52 U/L — ABNORMAL HIGH (ref 0–35)
AST: 52 U/L — ABNORMAL HIGH (ref 0–37)
Albumin: 3.9 g/dL (ref 3.5–5.2)
Alkaline Phosphatase: 75 U/L (ref 39–117)
Bilirubin, Direct: 0.1 mg/dL (ref 0.0–0.3)
Total Bilirubin: 0.7 mg/dL (ref 0.3–1.2)
Total Protein: 6.5 g/dL (ref 6.0–8.3)

## 2012-03-20 LAB — LIPID PANEL
Cholesterol: 200 mg/dL (ref 0–200)
HDL: 47.5 mg/dL (ref >39.00)
Total CHOL/HDL Ratio: 4
Triglycerides: 258 mg/dL — ABNORMAL HIGH (ref 0.0–149.0)
VLDL: 51.6 mg/dL — ABNORMAL HIGH (ref 0.0–40.0)

## 2012-03-20 LAB — LDL CHOLESTEROL, DIRECT: Direct LDL: 124.1 mg/dL

## 2012-03-20 NOTE — Progress Notes (Signed)
Quick Note:  Please make copy of labs for patient visit. ______ 

## 2012-03-23 ENCOUNTER — Ambulatory Visit (INDEPENDENT_AMBULATORY_CARE_PROVIDER_SITE_OTHER): Payer: Medicare Other | Admitting: Cardiology

## 2012-03-23 ENCOUNTER — Encounter: Payer: Self-pay | Admitting: Cardiology

## 2012-03-23 VITALS — BP 130/60 | HR 80 | Ht 61.0 in | Wt 164.0 lb

## 2012-03-23 DIAGNOSIS — R42 Dizziness and giddiness: Secondary | ICD-10-CM

## 2012-03-23 DIAGNOSIS — E876 Hypokalemia: Secondary | ICD-10-CM

## 2012-03-23 DIAGNOSIS — E785 Hyperlipidemia, unspecified: Secondary | ICD-10-CM

## 2012-03-23 DIAGNOSIS — F411 Generalized anxiety disorder: Secondary | ICD-10-CM

## 2012-03-23 DIAGNOSIS — I119 Hypertensive heart disease without heart failure: Secondary | ICD-10-CM

## 2012-03-23 DIAGNOSIS — I2510 Atherosclerotic heart disease of native coronary artery without angina pectoris: Secondary | ICD-10-CM

## 2012-03-23 DIAGNOSIS — F419 Anxiety disorder, unspecified: Secondary | ICD-10-CM

## 2012-03-23 MED ORDER — ALPRAZOLAM 0.25 MG PO TABS: 0.2500 mg | ORAL_TABLET | ORAL | Status: DC | PRN

## 2012-03-23 MED ORDER — POTASSIUM CHLORIDE CRYS ER 10 MEQ PO TBCR: EXTENDED_RELEASE_TABLET | ORAL | Status: DC

## 2012-03-23 NOTE — Progress Notes (Signed)
Megan [name] Date of Birth:  Apr 09, 1946 [facility] [address] [place], [place]  [address] [phone]         Fax   [phone]  History of Present Illness: This pleasant 66 year old married Caucasian woman is seen for a scheduled followup office visit.  She has a history of known ischemic heart disease.  She underwent coronary artery bypass graft surgery in 1995.  She had a renal artery stent for renal artery stenosis in 2008.  She had a stent graft of an abdominal aortic aneurysm in 2009.  She has a past history of high blood pressure and hypercholesterolemia.  She has had a history of mild elevation of liver function enzymes secondary to modest wine intake.  10 days ago she was hospitalized briefly at the hospital in [place] in the emergency room where she was taken because of dizziness.  She was found to be hypokalemic.  She had had some GI symptoms earlier that day.  Because she had had syncope they did a CT scan as well as a chest x-ray in the EKG.  Her Demadex was temporarily stopped she was allowed to be discharged home feeling greatly improved.  She felt well for the first 3 days at home but since then has been experiencing some mild dizziness.  It has some symptoms to suggest allergy or possibly vertigo.  We checked her orthostatic blood pressure today which was normal.   Current Outpatient Prescriptions  Medication Sig Dispense Refill  . ALPRAZolam (XANAX) 0.25 MG tablet Take 0.25 mg by mouth as needed.        [name] aspirin 325 MG tablet Take 325 mg by mouth daily.        [name] atorvastatin (LIPITOR) 20 MG tablet Take 1 tablet (20 mg total) by mouth daily.  30 tablet  11  . Cholecalciferol (VITAMIN D3) 5000 UNITS TABS Take by mouth daily. Taking 57846 units weekly      . clotrimazole (MYCELEX) 10 MG troche Take 10 mg by mouth as needed.        . Fluticasone-Salmeterol (ADVAIR DISKUS) 250-50 MCG/DOSE AEPB Inhale 2 puffs into the lungs every 12 (twelve) hours.         [name] losartan-hydrochlorothiazide (HYZAAR) 100-12.5 MG per tablet Take 1 tablet by mouth daily.        . nitroGLYCERIN (NITROSTAT) 0.4 MG SL tablet Place 0.4 mg under the tongue every 5 (five) minutes as needed.        [name] omeprazole (PRILOSEC) 20 MG capsule 20 mg daily.       [name] PRENATAL VITAMINS PO Take 1 tablet by mouth daily.        [name] torsemide (DEMADEX) 20 MG tablet Take 20 mg by mouth. 1/2 tablet every other day      . potassium chloride SA (K-DUR,KLOR-CON) 10 MEQ tablet 2 daily  60 tablet  11    Allergies  Allergen Reactions  . Bextra (Valdecoxib)     Increased lft's  . Codeine     REACTION: rash, itching  . Lipitor (Atorvastatin Calcium)     Muscle aches   . Lisinopril Cough  . Mevacor (Lovastatin)     "arthritis"  . Morphine And Related Nausea Only  . Plavix (Clopidogrel Bisulfate)     Itching of hands and feet  . Zocor (Simvastatin)     Bones hurt   . Tussionex Pennkinetic Er Rash    Patient Active Problem List  Diagnoses  . Benign hypertensive heart  disease without heart failure  . Dyslipidemia  . Ischemic heart disease  . History of abdominal aortic aneurysm repair  . History of renal artery stenosis  . Hypokalemia    History  Smoking status  . Former Smoker  . Quit date: 12/07/1995  Smokeless tobacco  . Never Used    History  Alcohol Use  . 4.0 oz/week  . 8 drink(s) per week    8 drinks per wwek    Family History  Problem Relation Age of Onset  . Heart disease Mother   . Heart disease Father   . Heart disease Sister   . Diabetes Sister   . Heart disease Brother   . Diabetes Brother     Review of Systems: Constitutional: no fever chills diaphoresis or fatigue or change in weight.  Head and neck: no hearing loss, no epistaxis, no photophobia or visual disturbance. Respiratory: No cough, shortness of breath or wheezing. Cardiovascular: No chest pain peripheral edema, palpitations. Gastrointestinal: No abdominal distention, no abdominal  pain, no change in bowel habits hematochezia or melena. Genitourinary: No dysuria, no frequency, no urgency, no nocturia. Musculoskeletal:No arthralgias, no back pain, no gait disturbance or myalgias. Neurological: No dizziness, no headaches, no numbness, no seizures, no syncope, no weakness, no tremors. Hematologic: No lymphadenopathy, no easy bruising. Psychiatric: No confusion, no hallucinations, no sleep disturbance.    Physical Exam: Filed Vitals:   03/23/12 1046  BP: 130/60  Pulse: 80   the general appearance reveals a well-developed well-nourished woman in no distress.Pupils equal and reactive.   Extraocular Movements are full.  There is no scleral icterus.  The mouth and pharynx are normal.  The neck is supple.  The carotids reveal no bruits.  The jugular venous pressure is normal.  The thyroid is not enlarged.  There is no lymphadenopathy.  There is no nystagmus. The chest is clear to percussion and auscultation. There are no rales or rhonchi. Expansion of the chest is symmetrical.  The precordium is quiet.  The first heart sound is normal.  The second heart sound is physiologically split.  There is no murmur gallop rub or click.  There is no abnormal lift or heave.  The abdomen is soft and nontender. Bowel sounds are normal. The liver and spleen are not enlarged. There Are no abdominal masses. There are no bruits.  The pedal pulses are good.  There is no phlebitis or edema.  There is no cyanosis or clubbing. Strength is normal and symmetrical in all extremities.  There is no lateralizing weakness.  There are no sensory deficits.       Assessment / Plan:  continue same medication with the addition of oral potassium and restarting low-dose Demadex .return in 2 months for followup office visit and we'll recheck a basal metabolic panel and also recheck the hepatic function panel since her liver function tests remained slightly high.  She will cut back further on wine ingestion

## 2012-03-23 NOTE — Assessment & Plan Note (Signed)
We reviewed her recent lipid studies which are improved although still not down to target.  She will continue same medication and work harder on diet and on weight loss and exercise

## 2012-03-23 NOTE — Patient Instructions (Signed)
Resume Demadex 20 mg 1/2 tablet every other day  Start Kdur 10 meq 2 tablets daily, sent to [facility]  Your physician recommends that you schedule a follow-up appointment in: 2 months office visit with labs (bmet and hfp)

## 2012-03-23 NOTE — Assessment & Plan Note (Signed)
The patient has a normal serum potassium today.  It is 4.1.  The patient is concerned about returning peripheral edema and we will restart her Demadex and a smaller dose of just one half tablet every other day.  To prevent future hypokalemia she will be taking generic K. Dur 10 mEq 2 tablets daily.

## 2012-03-23 NOTE — Assessment & Plan Note (Signed)
Blood pressure today was normal supine and standing.  She has not been having any headaches.  He is not having any shortness of breath.

## 2012-05-18 ENCOUNTER — Other Ambulatory Visit (INDEPENDENT_AMBULATORY_CARE_PROVIDER_SITE_OTHER): Payer: Medicare Other

## 2012-05-18 DIAGNOSIS — I119 Hypertensive heart disease without heart failure: Secondary | ICD-10-CM

## 2012-05-18 DIAGNOSIS — E876 Hypokalemia: Secondary | ICD-10-CM

## 2012-05-18 LAB — BASIC METABOLIC PANEL
BUN: 13 mg/dL (ref 6–23)
CO2: 27 mEq/L (ref 19–32)
Calcium: 9.4 mg/dL (ref 8.4–10.5)
Chloride: 106 mEq/L (ref 96–112)
Creatinine, Ser: 0.9 mg/dL (ref 0.4–1.2)
GFR: 70.75 mL/min (ref >60.00)
Glucose, Bld: 107 mg/dL — ABNORMAL HIGH (ref 70–99)
Potassium: 3.9 mEq/L (ref 3.5–5.1)
Sodium: 142 mEq/L (ref 135–145)

## 2012-05-18 LAB — HEPATIC FUNCTION PANEL
ALT: 49 U/L — ABNORMAL HIGH (ref 0–35)
AST: 49 U/L — ABNORMAL HIGH (ref 0–37)
Albumin: 3.7 g/dL (ref 3.5–5.2)
Alkaline Phosphatase: 77 U/L (ref 39–117)
Bilirubin, Direct: 0.1 mg/dL (ref 0.0–0.3)
Total Bilirubin: 0.9 mg/dL (ref 0.3–1.2)
Total Protein: 6.2 g/dL (ref 6.0–8.3)

## 2012-05-19 NOTE — Progress Notes (Signed)
Quick Note:  Please make copy of labs for patient visit. ______ 

## 2012-05-22 ENCOUNTER — Ambulatory Visit (INDEPENDENT_AMBULATORY_CARE_PROVIDER_SITE_OTHER): Payer: Medicare Other | Admitting: Cardiology

## 2012-05-22 ENCOUNTER — Encounter: Payer: Self-pay | Admitting: Cardiology

## 2012-05-22 VITALS — BP 128/78 | HR 80 | Ht 61.0 in | Wt 166.0 lb

## 2012-05-22 DIAGNOSIS — I259 Chronic ischemic heart disease, unspecified: Secondary | ICD-10-CM

## 2012-05-22 DIAGNOSIS — F419 Anxiety disorder, unspecified: Secondary | ICD-10-CM

## 2012-05-22 DIAGNOSIS — F411 Generalized anxiety disorder: Secondary | ICD-10-CM

## 2012-05-22 DIAGNOSIS — I119 Hypertensive heart disease without heart failure: Secondary | ICD-10-CM

## 2012-05-22 DIAGNOSIS — E785 Hyperlipidemia, unspecified: Secondary | ICD-10-CM

## 2012-05-22 DIAGNOSIS — I2510 Atherosclerotic heart disease of native coronary artery without angina pectoris: Secondary | ICD-10-CM

## 2012-05-22 DIAGNOSIS — E876 Hypokalemia: Secondary | ICD-10-CM

## 2012-05-22 DIAGNOSIS — E7800 Pure hypercholesterolemia, unspecified: Secondary | ICD-10-CM

## 2012-05-22 MED ORDER — ALPRAZOLAM 0.25 MG PO TABS: 0.2500 mg | ORAL_TABLET | ORAL | Status: DC | PRN

## 2012-05-22 NOTE — Assessment & Plan Note (Signed)
The patient has had no recurrence of chest pain or angina 

## 2012-05-22 NOTE — Progress Notes (Signed)
Megan [name] Date of Birth:  04-10-1946 [facility] [address] [place], [place]  [address] [phone]         Fax   [phone]  History of Present Illness: This pleasant 66 year old woman is seen for a four-month followup office visit.  She has a past history of essential hypertension and a past history of coronary artery disease.  Also has a history of dyslipidemia.  She is status post coronary artery bypass graft surgery in 1995.  She had a renal artery stent for renal artery stenosis in 2008.  She had a stent graft of an abdominal aortic aneurysm 2009.  She has had chronic mild elevation of liver function enzymes secondary to modest morning intake.  In April she was hospitalized briefly Megan [name] for dizziness and was found to be hypokalemic.  She has been weaned off her Demadex and off for her potassium and has had no further episodes of dizziness.  Her blood pressure is followed closely by Dr. Duanne [name].  Current Outpatient Prescriptions  Medication Sig Dispense Refill  . ALPRAZolam (XANAX) 0.25 MG tablet Take 1 tablet (0.25 mg total) by mouth as needed.  30 tablet  5  . aspirin 325 MG tablet Take 325 mg by mouth daily.        [name] atorvastatin (LIPITOR) 20 MG tablet Take 1 tablet (20 mg total) by mouth daily.  30 tablet  11  . Cholecalciferol (VITAMIN D3) 5000 UNITS TABS Take by mouth daily. Taking 52841 units weekly      . clotrimazole (MYCELEX) 10 MG troche Take 10 mg by mouth as needed.        . Fluticasone-Salmeterol (ADVAIR DISKUS) 250-50 MCG/DOSE AEPB Inhale 2 puffs into the lungs every 12 (twelve) hours.        [name] losartan-hydrochlorothiazide (HYZAAR) 100-12.5 MG per tablet Take 1 tablet by mouth daily.        . nitroGLYCERIN (NITROSTAT) 0.4 MG SL tablet Place 0.4 mg under the tongue every 5 (five) minutes as needed.        [name] omeprazole (PRILOSEC) 20 MG capsule 20 mg daily.       [name] PRENATAL VITAMINS PO Take 1 tablet by mouth daily.          Allergies    Allergen Reactions  . Bextra (Valdecoxib)     Increased lft's  . Codeine     REACTION: rash, itching  . Lipitor (Atorvastatin Calcium)     Muscle aches   . Lisinopril Cough  . Mevacor (Lovastatin)     "arthritis"  . Morphine And Related Nausea Only  . Plavix (Clopidogrel Bisulfate)     Itching of hands and feet  . Zocor (Simvastatin)     Bones hurt   . Hydrocod Polst-Cpm Polst Er Rash    Patient Active Problem List  Diagnosis  . Benign hypertensive heart disease without heart failure  . Dyslipidemia  . Ischemic heart disease  . History of abdominal aortic aneurysm repair  . History of renal artery stenosis  . Hypokalemia    History  Smoking status  . Former Smoker  . Quit date: 12/07/1995  Smokeless tobacco  . Never Used    History  Alcohol Use  . 4.0 oz/week  . 8 drink(s) per week    8 drinks per wwek    Family History  Problem Relation Age of Onset  . Heart disease Mother   . Heart disease Father   . Heart disease Sister   .  Diabetes Sister   . Heart disease Brother   . Diabetes Brother     Review of Systems: Constitutional: no fever chills diaphoresis or fatigue or change in weight.  Head and neck: no hearing loss, no epistaxis, no photophobia or visual disturbance. Respiratory: No cough, shortness of breath or wheezing. Cardiovascular: No chest pain peripheral edema, palpitations. Gastrointestinal: No abdominal distention, no abdominal pain, no change in bowel habits hematochezia or melena. Genitourinary: No dysuria, no frequency, no urgency, no nocturia. Musculoskeletal:No arthralgias, no back pain, no gait disturbance or myalgias. Neurological: No dizziness, no headaches, no numbness, no seizures, no syncope, no weakness, no tremors. Hematologic: No lymphadenopathy, no easy bruising. Psychiatric: No confusion, no hallucinations, no sleep disturbance.    Physical Exam: Filed Vitals:   05/22/12 0939  BP: 128/78  Pulse: 80   the general  appearance reveals a well-developed well-nourished woman in no distress.The head and neck exam reveals pupils equal and reactive.  Extraocular movements are full.  There is no scleral icterus.  The mouth and pharynx are normal.  The neck is supple.  The carotids reveal no bruits.  The jugular venous pressure is normal.  The  thyroid is not enlarged.  There is no lymphadenopathy.  The chest is clear to percussion and auscultation.  There are no rales or rhonchi.  Expansion of the chest is symmetrical.  The precordium is quiet.  The first heart sound is normal.  The second heart sound is physiologically split.  There is no murmur gallop rub or click.  There is no abnormal lift or heave.  The abdomen is soft and nontender.  The bowel sounds are normal.  The liver and spleen are not enlarged.  There are no abdominal masses.  There are no abdominal bruits.  Extremities reveal good pedal pulses.  There is no phlebitis or edema.  There is no cyanosis or clubbing.  Strength is normal and symmetrical in all extremities.  There is no lateralizing weakness.  There are no sensory deficits.  The skin is warm and dry.  There is no rash.     Assessment / Plan:  Continue same medication.  Work harder on diet.  Recheck in 4 months for followup office visit and fasting lab work.  Continue to limit alcohol.

## 2012-05-22 NOTE — Assessment & Plan Note (Signed)
The serum potassium is remaining normal on her present diet and being off Demadex and also being off potassium tablet supplementation

## 2012-05-22 NOTE — Assessment & Plan Note (Signed)
The patient has had no further episodes of dizziness or syncope.  She is not having any symptoms of congestive heart failure.  She has been going to the gym 3 days a week.  She also enjoys kayaking.

## 2012-05-22 NOTE — Patient Instructions (Addendum)
Continue currents medications.  Your physician recommends that you schedule a follow-up appointment in: 4 months with Dr. Patty Sermons.  Your physician recommends that you return for fasting lab work in: 4 months - Lipid, Liver, BMET.

## 2012-05-22 NOTE — Assessment & Plan Note (Signed)
The patient has a history of dyslipidemia.  We reviewed her lab work today which is satisfactory.  Her weight is up 2 pounds since last visit and she will work harder on her diet.  She and her husband have had a lot of company and things will not quiet down much until mid July and and she promises to get serious about her diet

## 2012-05-23 ENCOUNTER — Other Ambulatory Visit: Payer: Self-pay | Admitting: Family Medicine

## 2012-05-23 DIAGNOSIS — Z1231 Encounter for screening mammogram for malignant neoplasm of breast: Secondary | ICD-10-CM

## 2012-06-20 ENCOUNTER — Ambulatory Visit
Admission: RE | Admit: 2012-06-20 | Discharge: 2012-06-20 | Disposition: A | Discharge status: Home / Self Care | Payer: Medicare Other | Source: Ambulatory Visit | Attending: Family Medicine | Admitting: Family Medicine

## 2012-06-20 DIAGNOSIS — Z1231 Encounter for screening mammogram for malignant neoplasm of breast: Secondary | ICD-10-CM

## 2012-08-02 ENCOUNTER — Other Ambulatory Visit: Payer: Self-pay

## 2012-08-14 ENCOUNTER — Encounter: Payer: Self-pay | Admitting: Neurosurgery

## 2012-08-15 ENCOUNTER — Other Ambulatory Visit (INDEPENDENT_AMBULATORY_CARE_PROVIDER_SITE_OTHER): Payer: Medicare Other | Admitting: *Deleted

## 2012-08-15 ENCOUNTER — Ambulatory Visit (INDEPENDENT_AMBULATORY_CARE_PROVIDER_SITE_OTHER): Payer: Medicare Other | Admitting: Neurosurgery

## 2012-08-15 ENCOUNTER — Encounter: Payer: Self-pay | Admitting: Neurosurgery

## 2012-08-15 VITALS — BP 141/84 | HR 61 | Resp 16 | Ht 61.5 in | Wt 164.2 lb

## 2012-08-15 DIAGNOSIS — Z48812 Encounter for surgical aftercare following surgery on the circulatory system: Secondary | ICD-10-CM

## 2012-08-15 DIAGNOSIS — I701 Atherosclerosis of renal artery: Secondary | ICD-10-CM

## 2012-08-15 DIAGNOSIS — I714 Abdominal aortic aneurysm, without rupture, unspecified: Secondary | ICD-10-CM

## 2012-08-15 NOTE — Addendum Note (Signed)
Addended by: Sharee Pimple on: 08/15/2012 03:02 PM   Modules accepted: Orders

## 2012-08-15 NOTE — Progress Notes (Signed)
[facility] [place] [facility] Office Note  CC: Annual renal artery stent duplex Referring Physician: Early  History of Present Illness: 66 year old female patient of Dr. Arbie Cookey followed for a right renal artery stent was placed in November 2008 as well as in the stent repair of a AAA and 2009. Patient denies any lower extremity pain or claudication. The patient denies any new vascular problems or any abdominal or back pain.  Past Medical History  Diagnosis Date  . Coronary artery disease   . Renal artery stenosis   . Hypertension   . Shortness of breath on exertion   . Hyperlipidemia   . Osteoarthritis   . Fibromyalgia   . Eczema, dyshidrotic     ROS: [x]  Positive   [ ]  Denies    General: [ ]  Weight loss, [ ]  Fever, [ ]  chills Neurologic: [ ]  Dizziness, [ ]  Blackouts, [ ]  Seizure [ ]  Stroke, [ ]  "Mini stroke", [ ]  Slurred speech, [ ]  Temporary blindness; [ ]  weakness in arms or legs, [ ]  Hoarseness Cardiac: [ ]  Chest pain/pressure, [ ]  Shortness of breath at rest [ ]  Shortness of breath with exertion, [ ]  Atrial fibrillation or irregular heartbeat Vascular: [ ]  Pain in legs with walking, [ ]  Pain in legs at rest, [ ]  Pain in legs at night,  [ ]  Non-healing ulcer, [ ]  Blood clot in vein/DVT,   Pulmonary: [ ]  Home oxygen, [ ]  Productive cough, [ ]  Coughing up blood, [ ]  Asthma,  [ ]  Wheezing Musculoskeletal:  [ ]  Arthritis, [ ]  Low back pain, [ ]  Joint pain Hematologic: [ ]  Easy Bruising, [ ]  Anemia; [ ]  Hepatitis Gastrointestinal: [ ]  Blood in stool, [ ]  Gastroesophageal Reflux/heartburn, [ ]  Trouble swallowing Urinary: [ ]  chronic Kidney disease, [ ]  on HD - [ ]  MWF or [ ]  TTHS, [ ]  Burning with urination, [ ]  Difficulty urinating Skin: [ ]  Rashes, [ ]  Wounds Psychological: [ ]  Anxiety, [ ]  Depression   Social History History  Substance Use Topics  . Smoking status: Former Smoker    Quit date: 12/07/1995  . Smokeless tobacco: Never Used  . Alcohol Use:  4.0 oz/week    8 drink(s) per week     8 drinks per wwek    Family History Family History  Problem Relation Age of Onset  . Heart disease Mother   . Heart disease Father   . Heart disease Sister   . Diabetes Sister   . Heart disease Brother   . Diabetes Brother     Allergies  Allergen Reactions  . Bextra (Valdecoxib)     Increased lft's  . Codeine     REACTION: rash, itching  . Lipitor (Atorvastatin Calcium)     Muscle aches   . Lisinopril Cough  . Mevacor (Lovastatin)     "arthritis"  . Morphine And Related Nausea Only  . Plavix (Clopidogrel Bisulfate)     Itching of hands and feet  . Zocor (Simvastatin)     Bones hurt   . Hydrocod Polst-Cpm Polst Er Rash    Current Outpatient Prescriptions  Medication Sig Dispense Refill  . ALPRAZolam (XANAX) 0.25 MG tablet Take 1 tablet (0.25 mg total) by mouth as needed.  30 tablet  5  . aspirin 325 MG tablet Take 325 mg by mouth daily.        [name] atorvastatin (LIPITOR) 20 MG tablet Take 1 tablet (20 mg total) by mouth daily.  30 tablet  11  . Cholecalciferol (VITAMIN D3) 5000 UNITS TABS Take by mouth daily. Taking 40981 units weekly      . clindamycin (CLINDAGEL) 1 % gel as needed.      . clotrimazole (MYCELEX) 10 MG troche Take 10 mg by mouth as needed.        [name] co-enzyme Q-10 30 MG capsule Take 100 mg by mouth daily.      . Fluticasone-Salmeterol (ADVAIR DISKUS) 250-50 MCG/DOSE AEPB Inhale 2 puffs into the lungs every 12 (twelve) hours.        [name] losartan-hydrochlorothiazide (HYZAAR) 100-12.5 MG per tablet Take 100.25 tablets by mouth daily.       . nitroGLYCERIN (NITROSTAT) 0.4 MG SL tablet Place 0.4 mg under the tongue every 5 (five) minutes as needed.        [name] omeprazole (PRILOSEC) 20 MG capsule 20 mg daily.       [name] PRENATAL VITAMINS PO Take 1 tablet by mouth daily.        . VOLTAREN 1 % GEL daily.        Physical Examination  Filed Vitals:   08/15/12 1047  BP: 141/84  Pulse: 61  Resp: 16    Body mass index is  30.52 kg/(m^2).  General:  WDWN in NAD Gait: Normal HEENT: WNL Eyes: Pupils equal Pulmonary: normal non-labored breathing , without Rales, rhonchi,  wheezing Cardiac: RRR, without  Murmurs, rubs or gallops; No carotid bruits Abdomen: soft, NT, no masses Skin: no rashes, ulcers noted Vascular Exam/Pulses: Palpable femoral pulses bilaterally, 2+ radial pulses bilaterally, no abdominal mass is palpated  Extremities without ischemic changes, no Gangrene , no cellulitis; no open wounds;  Musculoskeletal: no muscle wasting or atrophy  Neurologic: A&O X 3; Appropriate Affect ; SENSATION: normal; MOTOR FUNCTION:  moving all extremities equally. Speech is fluent/normal  Non-Invasive Vascular Imaging: Reviewed with Dr. Arbie Cookey. Right renal artery stent is patent no evidence of hemodynamically significant stenosis, Doppler velocities and renal aortic ratio suggests 69% stenosis of the left proximal renal artery which is not something that needs to be intervened upon per Dr. Arbie Cookey.  ASSESSMENT/PLAN: Asymptomatic patient doing well from the above procedures. The patient will followup in one year with her peak renal artery duplex as well as CT of the abdomen to evaluate the AAA stent repair. The patient's in agreement with this her questions were encouraged and answered.  Lauree Chandler ANP next  Clinic M.D.: Early

## 2012-08-15 NOTE — Progress Notes (Addendum)
[facility] [place] [facility]ot[facility] Office Note  Previous note in error. Wrong patient

## 2012-09-15 ENCOUNTER — Other Ambulatory Visit (INDEPENDENT_AMBULATORY_CARE_PROVIDER_SITE_OTHER): Payer: Medicare Other

## 2012-09-15 DIAGNOSIS — E7800 Pure hypercholesterolemia, unspecified: Secondary | ICD-10-CM

## 2012-09-15 LAB — BASIC METABOLIC PANEL
BUN: 18 mg/dL (ref 6–23)
CO2: 30 mEq/L (ref 19–32)
Calcium: 9.2 mg/dL (ref 8.4–10.5)
Chloride: 103 mEq/L (ref 96–112)
Creatinine, Ser: 0.8 mg/dL (ref 0.4–1.2)
GFR: 71.65 mL/min (ref >60.00)
Glucose, Bld: 119 mg/dL — ABNORMAL HIGH (ref 70–99)
Potassium: 3.4 mEq/L — ABNORMAL LOW (ref 3.5–5.1)
Sodium: 141 mEq/L (ref 135–145)

## 2012-09-15 LAB — LIPID PANEL
Cholesterol: 191 mg/dL (ref 0–200)
HDL: 45.6 mg/dL (ref >39.00)
LDL Cholesterol: 110 mg/dL — ABNORMAL HIGH (ref 0–99)
Total CHOL/HDL Ratio: 4
Triglycerides: 176 mg/dL — ABNORMAL HIGH (ref 0.0–149.0)
VLDL: 35.2 mg/dL (ref 0.0–40.0)

## 2012-09-15 LAB — HEPATIC FUNCTION PANEL
ALT: 88 U/L — ABNORMAL HIGH (ref 0–35)
AST: 73 U/L — ABNORMAL HIGH (ref 0–37)
Albumin: 3.7 g/dL (ref 3.5–5.2)
Alkaline Phosphatase: 74 U/L (ref 39–117)
Bilirubin, Direct: 0.1 mg/dL (ref 0.0–0.3)
Total Bilirubin: 0.8 mg/dL (ref 0.3–1.2)
Total Protein: 6.5 g/dL (ref 6.0–8.3)

## 2012-09-17 NOTE — Progress Notes (Signed)
Quick Note:  Please make copy of labs for patient visit. ______ 

## 2012-09-19 ENCOUNTER — Ambulatory Visit (INDEPENDENT_AMBULATORY_CARE_PROVIDER_SITE_OTHER): Payer: Medicare Other | Admitting: Cardiology

## 2012-09-19 ENCOUNTER — Encounter: Payer: Self-pay | Admitting: Cardiology

## 2012-09-19 VITALS — BP 130/60 | HR 71 | Resp 18 | Ht 63.0 in | Wt 166.4 lb

## 2012-09-19 DIAGNOSIS — E7800 Pure hypercholesterolemia, unspecified: Secondary | ICD-10-CM

## 2012-09-19 DIAGNOSIS — I25810 Atherosclerosis of coronary artery bypass graft(s) without angina pectoris: Secondary | ICD-10-CM

## 2012-09-19 DIAGNOSIS — I259 Chronic ischemic heart disease, unspecified: Secondary | ICD-10-CM

## 2012-09-19 DIAGNOSIS — R7989 Other specified abnormal findings of blood chemistry: Secondary | ICD-10-CM

## 2012-09-19 DIAGNOSIS — K5790 Diverticulosis of intestine, part unspecified, without perforation or abscess without bleeding: Secondary | ICD-10-CM | POA: Insufficient documentation

## 2012-09-19 DIAGNOSIS — K5730 Diverticulosis of large intestine without perforation or abscess without bleeding: Secondary | ICD-10-CM

## 2012-09-19 DIAGNOSIS — E876 Hypokalemia: Secondary | ICD-10-CM

## 2012-09-19 DIAGNOSIS — R945 Abnormal results of liver function studies: Secondary | ICD-10-CM

## 2012-09-19 NOTE — Assessment & Plan Note (Signed)
We reviewed her recent labs.  The labs do show any significant further elevation in her transaminases.  She has been drinking 2-3 glasses of wine nightly.  I've advised her to cut back to just one glass per night.  Continue same medication.

## 2012-09-19 NOTE — Progress Notes (Signed)
Megan [name] Date of Birth:  [birth year] [facility] [address] [place], [place]  [address] [phone]         Fax   [phone]  History of Present Illness: This pleasant 66 year old woman is seen for a four-month followup office visit. She has a past history of essential hypertension and a past history of coronary artery disease. Also has a history of dyslipidemia. She is status post coronary artery bypass graft surgery in 1995. She had a renal artery stent for renal artery stenosis in 2008. She had a stent graft of an abdominal aortic aneurysm 2009. She has had chronic mild elevation of liver function enzymes secondary to modest alcohol intake. In April she was hospitalized briefly Megan [name] for dizziness and was found to be hypokalemic. She has been weaned off her Demadex and off for her potassium and has had no further episodes of dizziness. Her blood pressure is followed closely by Dr. Duanne Guess.  Recently she has had some recurrent lower abdominal discomfort and is being evaluated for diverticulitis.   Current Outpatient Prescriptions  Medication Sig Dispense Refill  . ALPRAZolam (XANAX) 0.25 MG tablet Take 1 tablet (0.25 mg total) by mouth as needed.  30 tablet  5  . aspirin 325 MG tablet Take 325 mg by mouth daily.        [name] atorvastatin (LIPITOR) 20 MG tablet Take 1 tablet (20 mg total) by mouth daily.  30 tablet  11  . Cholecalciferol (VITAMIN D3) 5000 UNITS TABS Take by mouth daily. Taking 57846 units weekly      . clindamycin (CLINDAGEL) 1 % gel as needed.      . clotrimazole (MYCELEX) 10 MG troche Take 10 mg by mouth as needed.        [name] co-enzyme Q-10 30 MG capsule Take 100 mg by mouth daily.      . Fluticasone-Salmeterol (ADVAIR DISKUS) 250-50 MCG/DOSE AEPB Inhale 2 puffs into the lungs every 12 (twelve) hours.        [name] losartan-hydrochlorothiazide (HYZAAR) 100-12.5 MG per tablet Take 100.25 tablets by mouth daily.       . nitroGLYCERIN (NITROSTAT) 0.4  MG SL tablet Place 0.4 mg under the tongue every 5 (five) minutes as needed.        [name] omeprazole (PRILOSEC) 20 MG capsule 20 mg daily.       [name] PRENATAL VITAMINS PO Take 1 tablet by mouth daily.        . VOLTAREN 1 % GEL daily.        Allergies  Allergen Reactions  . Bextra (Valdecoxib)     Increased lft's  . Codeine     REACTION: rash, itching  . Lipitor (Atorvastatin Calcium)     Muscle aches   . Lisinopril Cough  . Mevacor (Lovastatin)     "arthritis"  . Morphine And Related Nausea Only  . Plavix (Clopidogrel Bisulfate)     Itching of hands and feet  . Zocor (Simvastatin)     Bones hurt   . Hydrocod Polst-Cpm Polst Er Rash    Patient Active Problem List  Diagnosis  . Benign hypertensive heart disease without heart failure  . Dyslipidemia  . Ischemic heart disease  . History of abdominal aortic aneurysm repair  . History of renal artery stenosis  . Hypokalemia  . Atherosclerosis of renal artery    History  Smoking status  . Former Smoker  . Quit date: 12/07/1995  Smokeless tobacco  . Never Used  History  Alcohol Use  . 4.0 oz/week  . 8 drink(s) per week    8 drinks per wwek    Family History  Problem Relation Age of Onset  . Heart disease Mother   . Heart disease Father   . Heart disease Sister   . Diabetes Sister   . Heart disease Brother   . Diabetes Brother     Review of Systems: Constitutional: no fever chills diaphoresis or fatigue or change in weight.  Head and neck: no hearing loss, no epistaxis, no photophobia or visual disturbance. Respiratory: No cough, shortness of breath or wheezing. Cardiovascular: No chest pain peripheral edema, palpitations. Gastrointestinal: No abdominal distention, no abdominal pain, no change in bowel habits hematochezia or melena. Genitourinary: No dysuria, no frequency, no urgency, no nocturia. Musculoskeletal:No arthralgias, no back pain, no gait disturbance or myalgias. Neurological: No dizziness, no  headaches, no numbness, no seizures, no syncope, no weakness, no tremors. Hematologic: No lymphadenopathy, no easy bruising. Psychiatric: No confusion, no hallucinations, no sleep disturbance.    Physical Exam: Filed Vitals:   09/19/12 1529  BP: 130/60  Pulse: 71  Resp: 18   the general appearance reveals a well-developed well-nourished woman in no distress.  Her weight is unchanged since last visit.The head and neck exam reveals pupils equal and reactive.  Extraocular movements are full.  There is no scleral icterus.  The mouth and pharynx are normal.  The neck is supple.  The carotids reveal no bruits.  The jugular venous pressure is normal.  The  thyroid is not enlarged.  There is no lymphadenopathy.  The chest is clear to percussion and auscultation.  There are no rales or rhonchi.  Expansion of the chest is symmetrical.  The precordium is quiet.  The first heart sound is normal.  The second heart sound is physiologically split.  There is no gallop rub or click.  There is a soft systolic ejection murmur at the base.  No diastolic murmur.  There is no abnormal lift or heave.  The abdomen is soft and nontender.  The bowel sounds are normal.  The liver and spleen are not enlarged.  There are no abdominal masses.  There are no abdominal bruits.  Extremities reveal good pedal pulses.  There is no phlebitis or edema.  There is no cyanosis or clubbing.  Strength is normal and symmetrical in all extremities.  There is no lateralizing weakness.  There are no sensory deficits.  The skin is warm and dry.  There is no rash.     Assessment / Plan: Continue same cardiac meds.  Cut back on alcohol.  Recheck in 4 months for followup office visit EKG lipid panel hepatic function panel and basal metabolic panel.

## 2012-09-19 NOTE — Assessment & Plan Note (Signed)
The patient has not been experiencing any recurrent chest tightness to suggest angina pectoris.

## 2012-09-19 NOTE — Patient Instructions (Addendum)
Limit your wine to 1 glass a day  Your physician recommends that you continue on your current medications as directed. Please refer to the Current Medication list given to you today.  Your physician recommends that you schedule a follow-up appointment in: 4 months with fasting labs (lp/bmet/hfp) and EKG

## 2012-09-19 NOTE — Assessment & Plan Note (Signed)
The patient had recent lab work which shows that she is once again mildly hypokalemic.  She had stopped taking her potassium on a regular basis.  She has been experiencing some leg cramps.  She will resume her usual potassium intake.

## 2012-09-19 NOTE — Assessment & Plan Note (Signed)
The patient has diverticulosis.  She has not had a third bout of diverticulitis.  She is scheduled to have a CT scan later this week ordered by Dr. Duanne Guess.  She is also about to start metronidazole for her diverticulitis and so she will be off alcohol altogether while she is taking the metronidazole

## 2012-10-30 ENCOUNTER — Other Ambulatory Visit: Payer: Self-pay

## 2012-10-30 DIAGNOSIS — E785 Hyperlipidemia, unspecified: Secondary | ICD-10-CM

## 2012-10-30 MED ORDER — ATORVASTATIN CALCIUM 20 MG PO TABS: 20.0000 mg | ORAL_TABLET | Freq: Every day | ORAL | Status: DC

## 2012-11-21 ENCOUNTER — Other Ambulatory Visit: Payer: Self-pay | Admitting: *Deleted

## 2012-11-21 DIAGNOSIS — F419 Anxiety disorder, unspecified: Secondary | ICD-10-CM

## 2012-11-21 MED ORDER — ALPRAZOLAM 0.25 MG PO TABS: 0.2500 mg | ORAL_TABLET | ORAL | Status: DC | PRN

## 2013-01-15 ENCOUNTER — Other Ambulatory Visit (INDEPENDENT_AMBULATORY_CARE_PROVIDER_SITE_OTHER): Payer: Medicare Other

## 2013-01-15 DIAGNOSIS — E7800 Pure hypercholesterolemia, unspecified: Secondary | ICD-10-CM

## 2013-01-15 LAB — BASIC METABOLIC PANEL
BUN: 13 mg/dL (ref 6–23)
CO2: 32 mEq/L (ref 19–32)
Calcium: 9.6 mg/dL (ref 8.4–10.5)
Chloride: 102 mEq/L (ref 96–112)
Creatinine, Ser: 0.8 mg/dL (ref 0.4–1.2)
GFR: 79.14 mL/min (ref >60.00)
Glucose, Bld: 123 mg/dL — ABNORMAL HIGH (ref 70–99)
Potassium: 4.5 mEq/L (ref 3.5–5.1)
Sodium: 141 mEq/L (ref 135–145)

## 2013-01-15 LAB — LIPID PANEL
Cholesterol: 176 mg/dL (ref 0–200)
HDL: 43.4 mg/dL (ref >39.00)
LDL Cholesterol: 96 mg/dL (ref 0–99)
Total CHOL/HDL Ratio: 4
Triglycerides: 181 mg/dL — ABNORMAL HIGH (ref 0.0–149.0)
VLDL: 36.2 mg/dL (ref 0.0–40.0)

## 2013-01-15 LAB — HEPATIC FUNCTION PANEL
ALT: 79 U/L — ABNORMAL HIGH (ref 0–35)
AST: 82 U/L — ABNORMAL HIGH (ref 0–37)
Albumin: 3.9 g/dL (ref 3.5–5.2)
Alkaline Phosphatase: 85 U/L (ref 39–117)
Bilirubin, Direct: 0.1 mg/dL (ref 0.0–0.3)
Total Bilirubin: 0.8 mg/dL (ref 0.3–1.2)
Total Protein: 7 g/dL (ref 6.0–8.3)

## 2013-01-15 NOTE — Progress Notes (Signed)
Quick Note:  Please make copy of labs for patient visit. ______ 

## 2013-01-17 ENCOUNTER — Ambulatory Visit (INDEPENDENT_AMBULATORY_CARE_PROVIDER_SITE_OTHER): Payer: Medicare Other | Admitting: Cardiology

## 2013-01-17 ENCOUNTER — Encounter: Payer: Self-pay | Admitting: Cardiology

## 2013-01-17 VITALS — BP 139/83 | HR 80 | Ht 61.0 in | Wt 169.0 lb

## 2013-01-17 DIAGNOSIS — I259 Chronic ischemic heart disease, unspecified: Secondary | ICD-10-CM

## 2013-01-17 DIAGNOSIS — E7800 Pure hypercholesterolemia, unspecified: Secondary | ICD-10-CM

## 2013-01-17 DIAGNOSIS — R945 Abnormal results of liver function studies: Secondary | ICD-10-CM

## 2013-01-17 DIAGNOSIS — I119 Hypertensive heart disease without heart failure: Secondary | ICD-10-CM

## 2013-01-17 DIAGNOSIS — R7989 Other specified abnormal findings of blood chemistry: Secondary | ICD-10-CM

## 2013-01-17 DIAGNOSIS — J40 Bronchitis, not specified as acute or chronic: Secondary | ICD-10-CM

## 2013-01-17 MED ORDER — NITROGLYCERIN 0.4 MG SL SUBL: 0.4000 mg | SUBLINGUAL_TABLET | SUBLINGUAL | Status: DC | PRN

## 2013-01-17 MED ORDER — AMLODIPINE BESYLATE 5 MG PO TABS: 5.0000 mg | ORAL_TABLET | Freq: Every day | ORAL | Status: DC

## 2013-01-17 MED ORDER — AZITHROMYCIN 250 MG PO TABS: ORAL_TABLET | ORAL | Status: DC

## 2013-01-17 NOTE — Assessment & Plan Note (Signed)
Her blood pressure is running higher at home.  It is any mid 140s and sometimes in the low 150s systolic.  We will add amlodipine 5 mg one daily.  She is intolerant of beta blockers because they cause her hair to fall out.

## 2013-01-17 NOTE — Assessment & Plan Note (Signed)
The patient has a history of abnormal liver function studies which could be multifactorial.  She is on a low dose of Lipitor 20 mg daily.  She also drinks moderate alcohol.  She also has gained weight.  She has been told in the past that she has a fatty liver.  She started on a new diet last week.  She and several friends are all starting a diet together and they will reunite on May 10 to see who has lost most weight.

## 2013-01-17 NOTE — Assessment & Plan Note (Signed)
The patient has not had any recurrent chest pain or angina pectoris. 

## 2013-01-17 NOTE — Patient Instructions (Signed)
Rx for Zpak sent to your pharmacy  Start Amlodipine 5 mg daily  Your physician recommends that you schedule a follow-up appointment in: 4 months with fasting labs (lp/bmet/hfp)

## 2013-01-17 NOTE — Progress Notes (Signed)
Megan [name] Date of Birth:  [birth year] [facility] [address] [place], [place]  [address] [phone]         Fax   [phone]  History of Present Illness: This pleasant 67 year old woman is seen for a four-month followup office visit. She has a past history of essential hypertension and a past history of coronary artery disease. Also has a history of dyslipidemia. She is status post coronary artery bypass graft surgery in 1995. She had a renal artery stent for renal artery stenosis in 2008. She had a stent graft of an abdominal aortic aneurysm 2009. She has had chronic mild elevation of liver function enzymes secondary to modest alcohol intake and to fatty liver. In April 2013 she was hospitalized briefly Megan [name] for dizziness and was found to be hypokalemic. She has been weaned off her Demadex and off for her potassium and has had no further episodes of dizziness. Her blood pressure is followed closely by her primary care provider.   Current Outpatient Prescriptions  Medication Sig Dispense Refill  . ALPRAZolam (XANAX) 0.25 MG tablet Take 1 tablet (0.25 mg total) by mouth as needed.  90 tablet  1  . amoxicillin (AMOXIL) 500 MG tablet Take 500 mg by mouth 2 (two) times daily.      [name] aspirin 325 MG tablet Take 325 mg by mouth daily.        [name] atorvastatin (LIPITOR) 20 MG tablet Take 1 tablet (20 mg total) by mouth daily.  30 tablet  11  . Cholecalciferol (VITAMIN D3) 5000 UNITS TABS Take by mouth daily. Taking 36644 units weekly      . clindamycin (CLINDAGEL) 1 % gel as needed.      . clotrimazole (MYCELEX) 10 MG troche Take 10 mg by mouth as needed.        [name] co-enzyme Q-10 30 MG capsule Take 100 mg by mouth daily.      . Fluticasone-Salmeterol (ADVAIR DISKUS) 250-50 MCG/DOSE AEPB Inhale 2 puffs into the lungs every 12 (twelve) hours.        [name] losartan-hydrochlorothiazide (HYZAAR) 100-12.5 MG per tablet Take 100.25 tablets by mouth daily.       .  nitroGLYCERIN (NITROSTAT) 0.4 MG SL tablet Place 1 tablet (0.4 mg total) under the tongue every 5 (five) minutes as needed.  25 tablet  prn  . omeprazole (PRILOSEC) 20 MG capsule 20 mg daily.       [name] PRENATAL VITAMINS PO Take 1 tablet by mouth daily.        . VOLTAREN 1 % GEL daily.      [name] amLODipine (NORVASC) 5 MG tablet Take 1 tablet (5 mg total) by mouth daily.  30 tablet  11  . azithromycin (ZITHROMAX) 250 MG tablet 2 tablets on day 1 and then 1 daily until finished  6 each  0   No current facility-administered medications for this visit.    Allergies  Allergen Reactions  . Bextra (Valdecoxib)     Increased lft's  . Codeine     REACTION: rash, itching  . Lipitor (Atorvastatin Calcium)     Muscle aches   . Lisinopril Cough  . Metoprolol     Hair loss  . Mevacor (Lovastatin)     "arthritis"  . Morphine And Related Nausea Only  . Plavix (Clopidogrel Bisulfate)     Itching of hands and feet  . Zocor (Simvastatin)     Bones hurt   . Hydrocod Polst-Cpm Polst Er  Rash    Patient Active Problem List  Diagnosis  . Benign hypertensive heart disease without heart failure  . Dyslipidemia  . Ischemic heart disease  . History of abdominal aortic aneurysm repair  . History of renal artery stenosis  . Hypokalemia  . Atherosclerosis of renal artery  . Abnormal LFTs (liver function tests)  . Diverticulosis    History  Smoking status  . Former Smoker  . Quit date: 12/07/1995  Smokeless tobacco  . Never Used    History  Alcohol Use  . 4.0 oz/week  . 8 drink(s) per week    Comment: 8 drinks per wwek    Family History  Problem Relation Age of Onset  . Heart disease Mother   . Heart disease Father   . Heart disease Sister   . Diabetes Sister   . Heart disease Brother   . Diabetes Brother     Review of Systems: Constitutional: no fever chills diaphoresis or fatigue or change in weight.  Head and neck: no hearing loss, no epistaxis, no photophobia or visual  disturbance. Respiratory: No cough, shortness of breath or wheezing. Cardiovascular: No chest pain peripheral edema, palpitations. Gastrointestinal: No abdominal distention, no abdominal pain, no change in bowel habits hematochezia or melena. Genitourinary: No dysuria, no frequency, no urgency, no nocturia. Musculoskeletal:No arthralgias, no back pain, no gait disturbance or myalgias. Neurological: No dizziness, no headaches, no numbness, no seizures, no syncope, no weakness, no tremors. Hematologic: No lymphadenopathy, no easy bruising. Psychiatric: No confusion, no hallucinations, no sleep disturbance.    Physical Exam: Filed Vitals:   01/17/13 0929  BP: 139/83  Pulse: 80   the general appearance reveals a well-developed well-nourished woman in no distress.  Her weight is up 3 pounds since last visit.The head and neck exam reveals pupils equal and reactive.  Extraocular movements are full.  There is no scleral icterus.  The mouth and pharynx are normal.  The neck is supple.  The carotids reveal no bruits.  The jugular venous pressure is normal.  The  thyroid is not enlarged.  There is no lymphadenopathy.  The chest is clear to percussion and auscultation.  There are no rales or rhonchi.  Expansion of the chest is symmetrical.  The precordium is quiet.  The first heart sound is normal.  The second heart sound is physiologically split.  There is no murmur gallop rub or click.  There is no abnormal lift or heave.  The abdomen is soft and nontender.  The bowel sounds are normal.  The liver and spleen are not enlarged.  There are no abdominal masses.  There are no abdominal bruits.  Extremities reveal good pedal pulses.  There is no phlebitis or edema.  There is no cyanosis or clubbing.  Strength is normal and symmetrical in all extremities.  There is no lateralizing weakness.  There are no sensory deficits.  The skin is warm and dry.  There is no rash.     Assessment / Plan: Continue same  medication.  Add amlodipine 5 mg one daily. The patient also has recent bronchitis and we are giving her a Z-Pak and she is already taking Mucinex. Recheck in 4 months for followup office visit lipid panel hepatic function panel and basal metabolic panel.

## 2013-01-20 ENCOUNTER — Other Ambulatory Visit: Payer: Self-pay

## 2013-01-24 ENCOUNTER — Ambulatory Visit: Payer: Medicare Other | Admitting: Cardiology

## 2013-03-28 ENCOUNTER — Other Ambulatory Visit: Payer: Self-pay | Admitting: Dermatology

## 2013-03-30 ENCOUNTER — Telehealth: Payer: Self-pay | Admitting: Cardiology

## 2013-03-30 ENCOUNTER — Other Ambulatory Visit: Payer: Self-pay

## 2013-03-30 MED ORDER — NITROGLYCERIN 0.4 MG SL SUBL: 0.4000 mg | SUBLINGUAL_TABLET | SUBLINGUAL | Status: DC | PRN

## 2013-03-30 NOTE — Telephone Encounter (Signed)
Advised Amber ok to give 2 bottles

## 2013-03-30 NOTE — Telephone Encounter (Signed)
New problem   Amber[facility] want to know if pt can get 2 bottles of Nitro. Please call [facility]

## 2013-05-17 ENCOUNTER — Other Ambulatory Visit (INDEPENDENT_AMBULATORY_CARE_PROVIDER_SITE_OTHER): Payer: Medicare Other

## 2013-05-17 DIAGNOSIS — E7800 Pure hypercholesterolemia, unspecified: Secondary | ICD-10-CM

## 2013-05-17 LAB — HEPATIC FUNCTION PANEL
ALT: 73 U/L — ABNORMAL HIGH (ref 0–35)
AST: 76 U/L — ABNORMAL HIGH (ref 0–37)
Albumin: 3.7 g/dL (ref 3.5–5.2)
Alkaline Phosphatase: 77 U/L (ref 39–117)
Bilirubin, Direct: 0.1 mg/dL (ref 0.0–0.3)
Total Bilirubin: 1 mg/dL (ref 0.3–1.2)
Total Protein: 6.5 g/dL (ref 6.0–8.3)

## 2013-05-17 LAB — LIPID PANEL
Cholesterol: 179 mg/dL (ref 0–200)
HDL: 50.9 mg/dL (ref >39.00)
LDL Cholesterol: 101 mg/dL — ABNORMAL HIGH (ref 0–99)
Total CHOL/HDL Ratio: 4
Triglycerides: 134 mg/dL (ref 0.0–149.0)
VLDL: 26.8 mg/dL (ref 0.0–40.0)

## 2013-05-17 LAB — BASIC METABOLIC PANEL
BUN: 12 mg/dL (ref 6–23)
CO2: 30 mEq/L (ref 19–32)
Calcium: 10 mg/dL (ref 8.4–10.5)
Chloride: 104 mEq/L (ref 96–112)
Creatinine, Ser: 0.8 mg/dL (ref 0.4–1.2)
GFR: 80.27 mL/min (ref >60.00)
Glucose, Bld: 115 mg/dL — ABNORMAL HIGH (ref 70–99)
Potassium: 3.6 mEq/L (ref 3.5–5.1)
Sodium: 141 mEq/L (ref 135–145)

## 2013-05-17 NOTE — Progress Notes (Signed)
Quick Note:  Please make copy of labs for patient visit. ______ 

## 2013-05-18 ENCOUNTER — Other Ambulatory Visit: Payer: Self-pay

## 2013-05-18 DIAGNOSIS — Z1231 Encounter for screening mammogram for malignant neoplasm of breast: Secondary | ICD-10-CM

## 2013-05-21 ENCOUNTER — Encounter: Payer: Self-pay | Admitting: Cardiology

## 2013-05-21 ENCOUNTER — Ambulatory Visit (INDEPENDENT_AMBULATORY_CARE_PROVIDER_SITE_OTHER): Payer: Medicare Other | Admitting: Cardiology

## 2013-05-21 VITALS — BP 138/76 | HR 64 | Ht 61.5 in | Wt 164.1 lb

## 2013-05-21 DIAGNOSIS — R0989 Other specified symptoms and signs involving the circulatory and respiratory systems: Secondary | ICD-10-CM

## 2013-05-21 DIAGNOSIS — I739 Peripheral vascular disease, unspecified: Secondary | ICD-10-CM

## 2013-05-21 DIAGNOSIS — R0600 Dyspnea, unspecified: Secondary | ICD-10-CM | POA: Insufficient documentation

## 2013-05-21 DIAGNOSIS — R7989 Other specified abnormal findings of blood chemistry: Secondary | ICD-10-CM

## 2013-05-21 DIAGNOSIS — I259 Chronic ischemic heart disease, unspecified: Secondary | ICD-10-CM

## 2013-05-21 DIAGNOSIS — R0609 Other forms of dyspnea: Secondary | ICD-10-CM

## 2013-05-21 DIAGNOSIS — R079 Chest pain, unspecified: Secondary | ICD-10-CM

## 2013-05-21 DIAGNOSIS — R945 Abnormal results of liver function studies: Secondary | ICD-10-CM

## 2013-05-21 MED ORDER — POTASSIUM CHLORIDE CRYS ER 10 MEQ PO TBCR: 10.0000 meq | EXTENDED_RELEASE_TABLET | Freq: Every day | ORAL | Status: DC

## 2013-05-21 NOTE — Assessment & Plan Note (Signed)
Since last visit the patient had noted some increased dyspnea climbing hills and walking on level ground.  She is also had some occasional left-sided sharp chest pain not necessarily related to exertion.  It has been several years since we last did a nuclear stress test and we will have her return for a two-daily walking Lexus scan Myoview stress test.

## 2013-05-21 NOTE — Assessment & Plan Note (Signed)
Liver function studies are still mildly abnormal.  I have encouraged her to cut back further on her wine intake.

## 2013-05-21 NOTE — Progress Notes (Signed)
Megan [name] Date of Birth:  1946/01/03 [facility] [address] [place], [place]  [address] [phone]         Fax   [phone]  History of Present Illness: This pleasant 67 year old woman is seen for a four-month followup office visit. She has a past history of essential hypertension and a past history of coronary artery disease. Also has a history of dyslipidemia. She is status post coronary artery bypass graft surgery in 1995. She had a renal artery stent for renal artery stenosis in 2008. She had a stent graft of an abdominal aortic aneurysm 2009. She has had chronic mild elevation of liver function enzymes secondary to modest alcohol intake and to fatty liver. In April 2013 she was hospitalized briefly Megan [name] for dizziness and was found to be hypokalemic. She has been weaned off her Demadex and off for her potassium and has had no further episodes of dizziness. Her blood pressure is followed closely by her primary care provider.  Since her last visit she has developed claudication of her left calf and has also had some chest tightness and has been more short of breath walking.   Current Outpatient Prescriptions  Medication Sig Dispense Refill  . ALPRAZolam (XANAX) 0.25 MG tablet Take 1 tablet (0.25 mg total) by mouth as needed.  90 tablet  1  . amLODipine (NORVASC) 5 MG tablet Take 1 tablet (5 mg total) by mouth daily.  30 tablet  11  . aspirin 325 MG tablet Take 325 mg by mouth daily.        [name] atorvastatin (LIPITOR) 20 MG tablet Take 1 tablet (20 mg total) by mouth daily.  30 tablet  11  . Cholecalciferol (VITAMIN D3) 5000 UNITS TABS Take by mouth daily. Taking 01027 units weekly      . clindamycin (CLINDAGEL) 1 % gel as needed.      . clotrimazole (MYCELEX) 10 MG troche Take 10 mg by mouth as needed.        [name] co-enzyme Q-10 30 MG capsule Take 100 mg by mouth daily.      [name] losartan-hydrochlorothiazide (HYZAAR) 100-12.5 MG per tablet Take 100.25 tablets  by mouth daily.       . nitroGLYCERIN (NITROSTAT) 0.4 MG SL tablet Place 1 tablet (0.4 mg total) under the tongue every 5 (five) minutes as needed.  25 tablet  prn  . omeprazole (PRILOSEC) 20 MG capsule 20 mg daily.       [name] PRENATAL VITAMINS PO Take 1 tablet by mouth daily.        . VOLTAREN 1 % GEL daily.      . potassium chloride (K-DUR,KLOR-CON) 10 MEQ tablet Take 1 tablet (10 mEq total) by mouth daily.  30 tablet  6   No current facility-administered medications for this visit.    Allergies  Allergen Reactions  . Bextra (Valdecoxib)     Increased lft's  . Codeine     REACTION: rash, itching  . Lipitor (Atorvastatin Calcium)     Muscle aches   . Lisinopril Cough  . Metoprolol     Hair loss  . Mevacor (Lovastatin)     "arthritis"  . Morphine And Related Nausea Only  . Plavix (Clopidogrel Bisulfate)     Itching of hands and feet  . Zocor (Simvastatin)     Bones hurt   . Hydrocod Polst-Cpm Polst Er Rash    Patient Active Problem List   Diagnosis Date Noted  . Benign hypertensive  heart disease without heart failure 05/18/2011    Priority: High  . Dyslipidemia 05/18/2011    Priority: High  . Ischemic heart disease 05/18/2011    Priority: High  . 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 Dyspnea on exertion 05/21/2013  . Abnormal LFTs (liver function tests) 09/19/2012  . Diverticulosis 09/19/2012  . Atherosclerosis of renal artery 08/15/2012  . Hypokalemia 03/23/2012  . History of abdominal aortic aneurysm repair 05/18/2011  . History of renal artery stenosis 05/18/2011    History  Smoking status  . Former Smoker  . Quit date: 12/07/1995  Smokeless tobacco  . Never Used    History  Alcohol Use  . 4.0 oz/week  . 8 drink(s) per week    Comment: 8 drinks per wwek    Family History  Problem Relation Age of Onset  . Heart disease Mother   . Heart disease Father   . Heart disease Sister   . Diabetes Sister   . Heart disease Brother   . Diabetes Brother      Review of Systems: Constitutional: no fever chills diaphoresis or fatigue or change in weight.  Head and neck: no hearing loss, no epistaxis, no photophobia or visual disturbance. Respiratory: No cough, shortness of breath or wheezing. Cardiovascular: No chest pain peripheral edema, palpitations. Gastrointestinal: No abdominal distention, no abdominal pain, no change in bowel habits hematochezia or melena. Genitourinary: No dysuria, no frequency, no urgency, no nocturia. Musculoskeletal:No arthralgias, no back pain, no gait disturbance or myalgias. Neurological: No dizziness, no headaches, no numbness, no seizures, no syncope, no weakness, no tremors. Hematologic: No lymphadenopathy, no easy bruising. Psychiatric: No confusion, no hallucinations, no sleep disturbance.    Physical Exam: Filed Vitals:   05/21/13 0856  BP: 138/76  Pulse: 64   the general appearance reveals a well-developed well-nourished woman in no distress.  Her weight is down 5 pounds since last visit.The head and neck exam reveals pupils equal and reactive.  Extraocular movements are full.  There is no scleral icterus.  The mouth and pharynx are normal.  The neck is supple.  The carotids reveal no bruits.  The jugular venous pressure is normal.  The  thyroid is not enlarged.  There is no lymphadenopathy.  The chest is clear to percussion and auscultation.  There are no rales or rhonchi.  Expansion of the chest is symmetrical.  The precordium is quiet.  The first heart sound is normal.  The second heart sound is physiologically split.  There is no murmur gallop rub or click.  There is no abnormal lift or heave.  The abdomen is soft and nontender.  The bowel sounds are normal.  The liver and spleen are not enlarged.  There are no abdominal masses.  There are no abdominal bruits.  Extremities reveal fair pedal pulses.  The left posterior tibial pulse is weaker than the right posterior tibial pulse.  There is no phlebitis or  edema.  There is no cyanosis or clubbing.  Strength is normal and symmetrical in all extremities.  There is no lateralizing weakness.  There are no sensory deficits.  The skin is warm and dry.  There is no rash.     Assessment / Plan: Continue same medication.  Head K. Dur 10 mEq one daily for borderline hypokalemia. Return for Lexa scan Myoview Return for arterial Dopplers of the legs Recheck in 4 months for followup office visit lipid panel hepatic function panel and basal metabolic panel

## 2013-05-21 NOTE — Patient Instructions (Signed)
Your physician has requested that you have en exercise stress myoview. Please follow instruction sheet, as given.  Your physician has requested that you have a lower  extremity arterial duplex. This test is an ultrasound of the arteries in the legs or arms. It looks at arterial blood flow in the legs and arms. Allow one hour for Lower and Upper Arterial scans. There are no restrictions or special instructions  Your physician recommends that you schedule a follow-up appointment in: 4 MONTHS WITH DR [facility]  Your physician has recommended you make the following change in your medication:   START POTASSIUM CHLORIDE/ KDUR 10 MEQ DAILY

## 2013-05-21 NOTE — Assessment & Plan Note (Signed)
The patient has been limited in her walking and by claudication of the left calf.  We will have her return for any arterial Doppler of the lower extremities to evaluate this further

## 2013-05-24 ENCOUNTER — Encounter (HOSPITAL_COMMUNITY): Payer: Medicare Other

## 2013-05-25 ENCOUNTER — Encounter (INDEPENDENT_AMBULATORY_CARE_PROVIDER_SITE_OTHER): Payer: Medicare Other

## 2013-05-25 DIAGNOSIS — I70219 Atherosclerosis of native arteries of extremities with intermittent claudication, unspecified extremity: Secondary | ICD-10-CM

## 2013-05-25 DIAGNOSIS — I739 Peripheral vascular disease, unspecified: Secondary | ICD-10-CM

## 2013-05-28 ENCOUNTER — Ambulatory Visit (HOSPITAL_COMMUNITY): Payer: Medicare Other | Attending: Cardiology | Admitting: Radiology

## 2013-05-28 ENCOUNTER — Telehealth: Payer: Self-pay | Admitting: Cardiology

## 2013-05-28 ENCOUNTER — Encounter: Payer: Self-pay | Admitting: Cardiology

## 2013-05-28 ENCOUNTER — Telehealth: Payer: Self-pay | Admitting: *Deleted

## 2013-05-28 VITALS — BP 143/67 | Ht 61.0 in | Wt 165.0 lb

## 2013-05-28 DIAGNOSIS — R0602 Shortness of breath: Secondary | ICD-10-CM

## 2013-05-28 DIAGNOSIS — E785 Hyperlipidemia, unspecified: Secondary | ICD-10-CM

## 2013-05-28 DIAGNOSIS — R5381 Other malaise: Secondary | ICD-10-CM | POA: Insufficient documentation

## 2013-05-28 DIAGNOSIS — I739 Peripheral vascular disease, unspecified: Secondary | ICD-10-CM | POA: Insufficient documentation

## 2013-05-28 DIAGNOSIS — Z8249 Family history of ischemic heart disease and other diseases of the circulatory system: Secondary | ICD-10-CM | POA: Insufficient documentation

## 2013-05-28 DIAGNOSIS — R079 Chest pain, unspecified: Secondary | ICD-10-CM | POA: Insufficient documentation

## 2013-05-28 DIAGNOSIS — Z87891 Personal history of nicotine dependence: Secondary | ICD-10-CM | POA: Insufficient documentation

## 2013-05-28 DIAGNOSIS — I259 Chronic ischemic heart disease, unspecified: Secondary | ICD-10-CM

## 2013-05-28 DIAGNOSIS — R0609 Other forms of dyspnea: Secondary | ICD-10-CM | POA: Insufficient documentation

## 2013-05-28 DIAGNOSIS — I2510 Atherosclerotic heart disease of native coronary artery without angina pectoris: Secondary | ICD-10-CM

## 2013-05-28 DIAGNOSIS — I10 Essential (primary) hypertension: Secondary | ICD-10-CM | POA: Insufficient documentation

## 2013-05-28 DIAGNOSIS — R0989 Other specified symptoms and signs involving the circulatory and respiratory systems: Secondary | ICD-10-CM | POA: Insufficient documentation

## 2013-05-28 DIAGNOSIS — R0600 Dyspnea, unspecified: Secondary | ICD-10-CM

## 2013-05-28 MED ORDER — REGADENOSON 0.4 MG/5ML IV SOLN
0.4000 mg | Freq: Once | INTRAVENOUS | Status: AC
  Administered 2013-05-28: 0.4 mg via INTRAVENOUS

## 2013-05-28 MED ORDER — TECHNETIUM TC 99M SESTAMIBI GENERIC - CARDIOLITE
33.0000 | Freq: Once | INTRAVENOUS | Status: AC | PRN
  Administered 2013-05-28: 33 via INTRAVENOUS

## 2013-05-28 NOTE — Progress Notes (Signed)
  [facility] SITE 3 NUCLEAR MED [address] [place], [place] [address] [phone]    Cardiology Nuclear Med Study  Megan [name] is a 67 y.o. female     MRN : [id number]     DOB: 07-16-1946  Procedure Date: 05/28/2013  Nuclear Med Background Indication for Stress Test:  Evaluation for Ischemia and Graft Patency History:  '95 MI>Heart Catheterization>CABG;MPS Cardiac Risk Factors: Claudication, Family History - CAD, History of Smoking, Hypertension, Lipids and PVD  Symptoms:  Chest Pain (last date of chest discomfort month ago), DOE and Fatigue   Nuclear Pre-Procedure Caffeine/Decaff Intake:  None NPO After: 7:00pm   Lungs:  clear O2 Sat: 95% on room air. IV 0.9% NS with Angio Cath:  22g  IV Site: R Hand  IV Started by:  Doyne Keel, CNMT  Chest Size (in):40 Cup Size: DDD  Height: 5\' 1"  (1.549 m)  Weight:  165 lb (74.844 kg)  BMI:  Body mass index is 31.19 kg/(m^2). Tech Comments:  n/a    Nuclear Med Study 1 or 2 day study: 2 day  Stress Test Type:  Treadmill/Lexiscan  Reading MD: Olga Millers, MD  Order Authorizing Provider:  Villa Herb  Resting Radionuclide: Technetium 38m Sestamibi  Resting Radionuclide Dose: 33.0 mCi on 05/29/13   Stress Radionuclide:  Technetium 31m Sestamibi  Stress Radionuclide Dose: 33.0 mCi on 05/28/13           Stress Protocol Rest HR: 62 Stress HR: 102  Rest BP:143/67 Stress BP: 178/69  Exercise Time (min): 2:00 METS: 1.6   Predicted Max HR: 152 bpm % Max HR: 67.11 bpm Rate Pressure Product: 28413   Dose of Adenosine (mg):  n/a Dose of Lexiscan: 0.4 mg  Dose of Atropine (mg): n/a Dose of Dobutamine: n/a mcg/kg/min (at max HR)  Stress Test Technologist: Cathlyn Parsons, RN  Nuclear Technologist:  Domenic Polite, CNMT     Rest Procedure:  Myocardial perfusion imaging was performed at rest 45 minutes following the intravenous administration of Technetium 41m Sestamibi. Rest ECG: NSR - Normal EKG  Stress  Procedure:  The patient received IV Lexiscan 0.4 mg over 15-seconds with concurrent low level exercise and then Technetium 60m Sestamibi was injected at 30-seconds while the patient continued walking one more minute. Patient had chest tightness 1-2/10,fatigue,SOB,nausea with infusion.Relieved in recovery. Quantitative spect images were obtained after a 45-minute delay. Stress ECG: No significant ST segment change suggestive of ischemia.  QPS Raw Data Images:  Acquisition technically good; normal left ventricular size. Stress Images:  Normal homogeneous uptake in all areas of the myocardium. Rest Images:  Normal homogeneous uptake in all areas of the myocardium. Subtraction (SDS):  No evidence of ischemia. Transient Ischemic Dilatation (Normal <1.22):  1.07 Lung/Heart Ratio (Normal <0.45):  0.30  Quantitative Gated Spect Images QGS EDV:  63 ml QGS ESV:  12 ml  Impression Exercise Capacity:  Lexiscan with no exercise. BP Response:  Normal blood pressure response. Clinical Symptoms:  There was chest pain and dyspnea. ECG Impression:  No significant ST segment change suggestive of ischemia. Comparison with Prior Nuclear Study: No images to compare  Overall Impression:  Normal stress nuclear study.  LV Ejection Fraction: 81%.  LV Wall Motion:  NL LV Function; NL Wall Motion  Olga Millers

## 2013-05-28 NOTE — Telephone Encounter (Signed)
Patient is concerned that her cholesterol is going up and she is on a lower dose of statin. Concerned that is should be increased but not sure if dose is being left where it is secondary to elevated LFT's. Advised most likely not being increased secondary to elevated LFT's but wanted to make sure with  Dr. Patty Sermons. Will forward for review. Patient aware will be next week before reviewed

## 2013-05-28 NOTE — Telephone Encounter (Signed)
New Prob      Pt would like to ask nurse a question. Please call.

## 2013-05-28 NOTE — Telephone Encounter (Signed)
Message copied by Burnell Blanks on Mon May 28, 2013  2:17 PM ------      Message from: Cassell Clement      Created: Sat May 26, 2013 10:12 PM       Please report.  The arterial studies of her lower extremities were normal. ------

## 2013-05-28 NOTE — Telephone Encounter (Signed)
Advised patient

## 2013-05-29 ENCOUNTER — Ambulatory Visit (HOSPITAL_COMMUNITY): Payer: Medicare Other | Attending: Cardiology

## 2013-05-29 ENCOUNTER — Encounter (HOSPITAL_COMMUNITY): Payer: Medicare Other

## 2013-05-29 DIAGNOSIS — R0989 Other specified symptoms and signs involving the circulatory and respiratory systems: Secondary | ICD-10-CM

## 2013-05-29 MED ORDER — TECHNETIUM TC 99M SESTAMIBI GENERIC - CARDIOLITE
33.0000 | Freq: Once | INTRAVENOUS | Status: AC | PRN
  Administered 2013-05-29: 33 via INTRAVENOUS

## 2013-05-29 MED ORDER — ATORVASTATIN CALCIUM 40 MG PO TABS: 40.0000 mg | ORAL_TABLET | Freq: Every day | ORAL | Status: DC

## 2013-05-29 NOTE — Telephone Encounter (Signed)
Advised patient and scheduled follow up labs  

## 2013-05-29 NOTE — Telephone Encounter (Signed)
Her LDL is not too bad at 101 however since she has had CAD her target would be 70s ideally.  We can try increasing her lipitor from 20 mg to 40 mg and observe response. Decrease ETOH to help liver enzymes and fatty liver.

## 2013-06-04 ENCOUNTER — Telehealth: Payer: Self-pay | Admitting: *Deleted

## 2013-06-04 NOTE — Telephone Encounter (Signed)
Message copied by Burnell Blanks on Mon Jun 04, 2013 12:22 PM ------      Message from: Cassell Clement      Created: Sun Jun 03, 2013  6:14 PM       Please report.  Nuclear stress test is normal. EF 81 % very good. ------

## 2013-06-04 NOTE — Telephone Encounter (Signed)
Advised patient of myoview  

## 2013-06-09 ENCOUNTER — Encounter (HOSPITAL_BASED_OUTPATIENT_CLINIC_OR_DEPARTMENT_OTHER): Payer: Self-pay | Admitting: *Deleted

## 2013-06-09 ENCOUNTER — Emergency Department (HOSPITAL_BASED_OUTPATIENT_CLINIC_OR_DEPARTMENT_OTHER): Payer: Medicare Other

## 2013-06-09 ENCOUNTER — Inpatient Hospital Stay (HOSPITAL_BASED_OUTPATIENT_CLINIC_OR_DEPARTMENT_OTHER)
Admission: EM | Admit: 2013-06-09 | Discharge: 2013-06-11 | DRG: 287 | Disposition: A | Discharge status: Home / Self Care | Payer: Medicare Other | Attending: Internal Medicine | Admitting: Internal Medicine

## 2013-06-09 DIAGNOSIS — K3189 Other diseases of stomach and duodenum: Secondary | ICD-10-CM | POA: Diagnosis present

## 2013-06-09 DIAGNOSIS — Z79899 Other long term (current) drug therapy: Secondary | ICD-10-CM

## 2013-06-09 DIAGNOSIS — I10 Essential (primary) hypertension: Secondary | ICD-10-CM | POA: Diagnosis present

## 2013-06-09 DIAGNOSIS — Z888 Allergy status to other drugs, medicaments and biological substances status: Secondary | ICD-10-CM

## 2013-06-09 DIAGNOSIS — E782 Mixed hyperlipidemia: Secondary | ICD-10-CM | POA: Diagnosis present

## 2013-06-09 DIAGNOSIS — Z951 Presence of aortocoronary bypass graft: Secondary | ICD-10-CM

## 2013-06-09 DIAGNOSIS — Z87891 Personal history of nicotine dependence: Secondary | ICD-10-CM

## 2013-06-09 DIAGNOSIS — Z9889 Other specified postprocedural states: Secondary | ICD-10-CM

## 2013-06-09 DIAGNOSIS — I200 Unstable angina: Secondary | ICD-10-CM | POA: Diagnosis present

## 2013-06-09 DIAGNOSIS — I70219 Atherosclerosis of native arteries of extremities with intermittent claudication, unspecified extremity: Secondary | ICD-10-CM | POA: Diagnosis present

## 2013-06-09 DIAGNOSIS — Z7982 Long term (current) use of aspirin: Secondary | ICD-10-CM

## 2013-06-09 DIAGNOSIS — I2510 Atherosclerotic heart disease of native coronary artery without angina pectoris: Secondary | ICD-10-CM | POA: Diagnosis present

## 2013-06-09 DIAGNOSIS — M1990 Unspecified osteoarthritis, unspecified site: Secondary | ICD-10-CM | POA: Diagnosis present

## 2013-06-09 DIAGNOSIS — L301 Dyshidrosis [pompholyx]: Secondary | ICD-10-CM | POA: Diagnosis present

## 2013-06-09 DIAGNOSIS — E785 Hyperlipidemia, unspecified: Secondary | ICD-10-CM | POA: Diagnosis present

## 2013-06-09 DIAGNOSIS — Z90710 Acquired absence of both cervix and uterus: Secondary | ICD-10-CM

## 2013-06-09 DIAGNOSIS — I2582 Chronic total occlusion of coronary artery: Secondary | ICD-10-CM | POA: Diagnosis present

## 2013-06-09 DIAGNOSIS — Z8679 Personal history of other diseases of the circulatory system: Secondary | ICD-10-CM | POA: Diagnosis present

## 2013-06-09 DIAGNOSIS — I739 Peripheral vascular disease, unspecified: Secondary | ICD-10-CM | POA: Diagnosis present

## 2013-06-09 DIAGNOSIS — IMO0001 Reserved for inherently not codable concepts without codable children: Secondary | ICD-10-CM | POA: Diagnosis present

## 2013-06-09 HISTORY — DX: Abdominal aortic aneurysm, without rupture, unspecified: I71.40

## 2013-06-09 HISTORY — DX: Gastro-esophageal reflux disease without esophagitis: K21.9

## 2013-06-09 HISTORY — DX: Angina pectoris, unspecified: I20.9

## 2013-06-09 HISTORY — DX: Acute myocardial infarction, unspecified: I21.9

## 2013-06-09 HISTORY — DX: Abdominal aortic aneurysm, without rupture: I71.4

## 2013-06-09 LAB — CBC WITH DIFFERENTIAL/PLATELET
Basophils Absolute: 0 10*3/uL (ref 0.0–0.1)
Basophils Relative: 0 % (ref 0–1)
Eosinophils Absolute: 0.2 10*3/uL (ref 0.0–0.7)
Eosinophils Relative: 3 % (ref 0–5)
HCT: 42.8 % (ref 36.0–46.0)
Hemoglobin: 15.1 g/dL — ABNORMAL HIGH (ref 12.0–15.0)
Lymphocytes Relative: 34 % (ref 12–46)
Lymphs Abs: 3.1 10*3/uL (ref 0.7–4.0)
MCH: 31.2 pg (ref 26.0–34.0)
MCHC: 35.3 g/dL (ref 30.0–36.0)
MCV: 88.4 fL (ref 78.0–100.0)
Monocytes Absolute: 0.7 10*3/uL (ref 0.1–1.0)
Monocytes Relative: 8 % (ref 3–12)
Neutro Abs: 5.1 10*3/uL (ref 1.7–7.7)
Neutrophils Relative %: 56 % (ref 43–77)
Platelets: 177 10*3/uL (ref 150–400)
RBC: 4.84 MIL/uL (ref 3.87–5.11)
RDW: 13.3 % (ref 11.5–15.5)
WBC: 9.3 10*3/uL (ref 4.0–10.5)

## 2013-06-09 LAB — HEPATIC FUNCTION PANEL
ALT: 76 U/L — ABNORMAL HIGH (ref 0–35)
AST: 68 U/L — ABNORMAL HIGH (ref 0–37)
Albumin: 3.7 g/dL (ref 3.5–5.2)
Alkaline Phosphatase: 87 U/L (ref 39–117)
Bilirubin, Direct: 0.1 mg/dL (ref 0.0–0.3)
Indirect Bilirubin: 0.3 mg/dL (ref 0.3–0.9)
Total Bilirubin: 0.4 mg/dL (ref 0.3–1.2)
Total Protein: 6.5 g/dL (ref 6.0–8.3)

## 2013-06-09 LAB — BASIC METABOLIC PANEL
BUN: 9 mg/dL (ref 6–23)
CO2: 32 mEq/L (ref 19–32)
Calcium: 9.8 mg/dL (ref 8.4–10.5)
Chloride: 101 mEq/L (ref 96–112)
Creatinine, Ser: 0.9 mg/dL (ref 0.50–1.10)
GFR calc Af Amer: 74 mL/min — ABNORMAL LOW (ref >90)
GFR calc non Af Amer: 64 mL/min — ABNORMAL LOW (ref >90)
Glucose, Bld: 116 mg/dL — ABNORMAL HIGH (ref 70–99)
Potassium: 3.4 mEq/L — ABNORMAL LOW (ref 3.5–5.1)
Sodium: 142 mEq/L (ref 135–145)

## 2013-06-09 LAB — TROPONIN I
Troponin I: <0.3 ng/mL (ref <0.30)
Troponin I: <0.3 ng/mL (ref <0.30)
Troponin I: <0.3 ng/mL (ref <0.30)

## 2013-06-09 LAB — HEPARIN LEVEL (UNFRACTIONATED): Heparin Unfractionated: 0.22 IU/mL — ABNORMAL LOW (ref 0.30–0.70)

## 2013-06-09 LAB — MRSA PCR SCREENING: MRSA by PCR: NEGATIVE

## 2013-06-09 LAB — PROTIME-INR
INR: 1.01 (ref 0.00–1.49)
Prothrombin Time: 13.1 seconds (ref 11.6–15.2)

## 2013-06-09 LAB — APTT: aPTT: 41 seconds — ABNORMAL HIGH (ref 24–37)

## 2013-06-09 MED ORDER — LOSARTAN POTASSIUM 50 MG PO TABS
100.0000 mg | ORAL_TABLET | Freq: Every day | ORAL | Status: DC
  Administered 2013-06-10 – 2013-06-11 (×2): 100 mg via ORAL
  Filled 2013-06-09 (×3): qty 2

## 2013-06-09 MED ORDER — SODIUM CHLORIDE 0.9 % IV SOLN: 250.0000 mL | INTRAVENOUS | Status: DC | PRN

## 2013-06-09 MED ORDER — NITROGLYCERIN 0.4 MG SL SUBL
0.4000 mg | SUBLINGUAL_TABLET | SUBLINGUAL | Status: DC | PRN
  Administered 2013-06-09: 0.4 mg via SUBLINGUAL
  Filled 2013-06-09: qty 25

## 2013-06-09 MED ORDER — SODIUM CHLORIDE 0.9 % IJ SOLN
3.0000 mL | Freq: Two times a day (BID) | INTRAMUSCULAR | Status: DC
  Administered 2013-06-09 – 2013-06-11 (×4): 3 mL via INTRAVENOUS

## 2013-06-09 MED ORDER — NITROGLYCERIN 0.4 MG SL SUBL
0.4000 mg | SUBLINGUAL_TABLET | SUBLINGUAL | Status: DC | PRN
  Administered 2013-06-10: 0.4 mg via SUBLINGUAL
  Filled 2013-06-09: qty 25

## 2013-06-09 MED ORDER — HEPARIN (PORCINE) IN NACL 100-0.45 UNIT/ML-% IJ SOLN
1150.0000 [IU]/h | INTRAMUSCULAR | Status: DC
  Administered 2013-06-09: 1000 [IU]/h via INTRAVENOUS
  Administered 2013-06-10: 1200 [IU]/h via INTRAVENOUS
  Administered 2013-06-11: 1150 [IU]/h via INTRAVENOUS
  Filled 2013-06-09 (×4): qty 250

## 2013-06-09 MED ORDER — POTASSIUM CHLORIDE CRYS ER 10 MEQ PO TBCR
10.0000 meq | EXTENDED_RELEASE_TABLET | Freq: Every day | ORAL | Status: DC
  Administered 2013-06-09 – 2013-06-11 (×3): 10 meq via ORAL
  Filled 2013-06-09 (×4): qty 1

## 2013-06-09 MED ORDER — FENTANYL CITRATE 0.05 MG/ML IJ SOLN
INTRAMUSCULAR | Status: AC
  Filled 2013-06-09: qty 2

## 2013-06-09 MED ORDER — CO Q 10 100 MG PO CAPS: 1.0000 | ORAL_CAPSULE | Freq: Every day | ORAL | Status: DC

## 2013-06-09 MED ORDER — PANTOPRAZOLE SODIUM 40 MG PO TBEC
40.0000 mg | DELAYED_RELEASE_TABLET | Freq: Every day | ORAL | Status: DC
  Administered 2013-06-10 – 2013-06-11 (×2): 40 mg via ORAL
  Filled 2013-06-09 (×3): qty 1

## 2013-06-09 MED ORDER — ACETAMINOPHEN 325 MG PO TABS
650.0000 mg | ORAL_TABLET | ORAL | Status: DC | PRN
  Administered 2013-06-10: 650 mg via ORAL
  Filled 2013-06-09: qty 2

## 2013-06-09 MED ORDER — GI COCKTAIL ~~LOC~~
30.0000 mL | Freq: Once | ORAL | Status: AC
  Administered 2013-06-09: 30 mL via ORAL
  Filled 2013-06-09: qty 30

## 2013-06-09 MED ORDER — VITAMIN D3 125 MCG (5000 UT) PO TABS: 1.0000 | ORAL_TABLET | ORAL | Status: DC

## 2013-06-09 MED ORDER — VITAMIN D (ERGOCALCIFEROL) 1.25 MG (50000 UNIT) PO CAPS: 50000.0000 [IU] | ORAL_CAPSULE | ORAL | Status: DC

## 2013-06-09 MED ORDER — ASPIRIN EC 81 MG PO TBEC
81.0000 mg | DELAYED_RELEASE_TABLET | Freq: Every day | ORAL | Status: DC
  Administered 2013-06-10: 81 mg via ORAL
  Filled 2013-06-09 (×2): qty 1

## 2013-06-09 MED ORDER — ALPRAZOLAM 0.25 MG PO TABS
0.2500 mg | ORAL_TABLET | Freq: Every evening | ORAL | Status: DC | PRN
  Administered 2013-06-09: 0.25 mg via ORAL
  Filled 2013-06-09: qty 1

## 2013-06-09 MED ORDER — HYDROCHLOROTHIAZIDE 12.5 MG PO CAPS
12.5000 mg | ORAL_CAPSULE | Freq: Every day | ORAL | Status: DC
  Administered 2013-06-10 – 2013-06-11 (×2): 12.5 mg via ORAL
  Filled 2013-06-09 (×3): qty 1

## 2013-06-09 MED ORDER — FENTANYL CITRATE 0.05 MG/ML IJ SOLN
25.0000 ug | Freq: Once | INTRAMUSCULAR | Status: AC
  Administered 2013-06-09: 25 ug via INTRAVENOUS

## 2013-06-09 MED ORDER — SODIUM CHLORIDE 0.9 % IJ SOLN: 3.0000 mL | INTRAMUSCULAR | Status: DC | PRN

## 2013-06-09 MED ORDER — LOSARTAN POTASSIUM-HCTZ 100-12.5 MG PO TABS
1.0000 | ORAL_TABLET | Freq: Every day | ORAL | Status: DC
Start: 2013-06-09 — End: 2013-06-09

## 2013-06-09 MED ORDER — HEPARIN BOLUS VIA INFUSION
4000.0000 [IU] | Freq: Once | INTRAVENOUS | Status: AC
  Administered 2013-06-09: 4000 [IU] via INTRAVENOUS

## 2013-06-09 MED ORDER — ATORVASTATIN CALCIUM 40 MG PO TABS
40.0000 mg | ORAL_TABLET | Freq: Every day | ORAL | Status: DC
  Administered 2013-06-09 – 2013-06-10 (×2): 40 mg via ORAL
  Filled 2013-06-09 (×3): qty 1

## 2013-06-09 MED ORDER — AMLODIPINE BESYLATE 5 MG PO TABS
5.0000 mg | ORAL_TABLET | Freq: Every day | ORAL | Status: DC
  Administered 2013-06-10 – 2013-06-11 (×2): 5 mg via ORAL
  Filled 2013-06-09 (×2): qty 1

## 2013-06-09 MED ORDER — ONDANSETRON HCL 4 MG/2ML IJ SOLN
4.0000 mg | Freq: Four times a day (QID) | INTRAMUSCULAR | Status: DC | PRN
  Administered 2013-06-10: 4 mg via INTRAVENOUS
  Filled 2013-06-09: qty 2

## 2013-06-09 NOTE — ED Notes (Signed)
Heparin Bolus and infusion verified by Brett Canales, RN and this RN.

## 2013-06-09 NOTE — ED Notes (Addendum)
Patient states she had chest pain last night that eased off and started back this morning , took 2 nitro last night around 21:30 and three nitro tabs this morning, states it feels like an ache

## 2013-06-09 NOTE — ED Provider Notes (Addendum)
History    CSN: [id number] Arrival date & time 06/09/13  1250  First MD Initiated Contact with Patient 06/09/13 1259     Chief Complaint  Patient presents with  . Chest Pain   (Consider location/radiation/quality/duration/timing/severity/associated sxs/prior Treatment) Patient is a 67 y.o. female presenting with chest pain. The history is provided by the patient.  Chest Pain Pain location:  Substernal area and L chest Pain quality: aching, dull and pressure   Pain radiates to:  L jaw, L shoulder and upper back Pain radiates to the back: yes   Pain severity:  Moderate Onset quality:  Sudden Duration:  16 hours Timing:  Constant Progression:  Waxing and waning Chronicity:  New Context comment:  Started at rest and not affected by exertion, eating or position Relieved by:  Nothing Worsened by:  Nothing tried Ineffective treatments:  Nitroglycerin and aspirin (last night felt 2NTG helped some, but no help today.  last NTG 1 hour pta) Associated symptoms: syncope   Associated symptoms: no abdominal pain, no cough, no fatigue, no fever, no nausea, no palpitations, not vomiting and no weakness   Associated symptoms comment:  Clammy hands Risk factors: aortic disease, coronary artery disease, high cholesterol and hypertension   Risk factors: no diabetes mellitus and no smoking    Past Medical History  Diagnosis Date  . Coronary artery disease   . Renal artery stenosis   . Hypertension   . Shortness of breath on exertion   . Hyperlipidemia   . Osteoarthritis   . Fibromyalgia   . Eczema, dyshidrotic    Past Surgical History  Procedure Laterality Date  . Renal artery stent  2008  . Abdominal aortic aneurysm repair  2009    stenting  . 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 Cardiac catheterization    . Carpal tunnel release  08/1993    left wrist  . Tonsilectomy, adenoidectomy, bilateral myringotomy and tubes  1989  . Total abdominal  hysterectomy  1992  . Carpal tunnel release  01/1997    right  . Thyroid cyst apiration  05/1997 and 07/1998  . Cardiovascular stress test  11/11/2009    normal study   Family History  Problem Relation Age of Onset  . Heart disease Mother   . Heart disease Father   . Heart disease Sister   . Diabetes Sister   . Heart disease Brother   . Diabetes Brother    History  Substance Use Topics  . Smoking status: Former Smoker    Quit date: 12/07/1995  . Smokeless tobacco: Never Used  . Alcohol Use: 4.0 oz/week    8 drink(s) per week     Comment: 8 drinks per wwek   OB History   Grav Para Term Preterm Abortions TAB SAB Ect Mult Living                 Review of Systems  Constitutional: Negative for fever and fatigue.  Respiratory: Negative for cough.   Cardiovascular: Positive for chest pain and syncope. Negative for palpitations.  Gastrointestinal: Negative for nausea, vomiting and abdominal pain.  Neurological: Negative for weakness.  All other systems reviewed and are negative.    Allergies  Bextra; Codeine; Lipitor; Lisinopril; Metoprolol; Mevacor; Morphine and related; Plavix; Zocor; and Hydrocod polst-cpm polst er  Home Medications   Current Outpatient Rx  Name  Route  Sig  Dispense  Refill  . ALPRAZolam (XANAX) 0.25 MG tablet  Oral   Take 1 tablet (0.25 mg total) by mouth as needed.   90 tablet   1   . amLODipine (NORVASC) 5 MG tablet   Oral   Take 1 tablet (5 mg total) by mouth daily.   30 tablet   11   . aspirin 325 MG tablet   Oral   Take 325 mg by mouth daily.           [name] atorvastatin (LIPITOR) 40 MG tablet   Oral   Take 1 tablet (40 mg total) by mouth daily.   30 tablet   11     NEW DOSE   . Cholecalciferol (VITAMIN D3) 5000 UNITS TABS   Oral   Take by mouth daily. Taking 16109 units weekly         . clindamycin (CLINDAGEL) 1 % gel      as needed.         . clotrimazole (MYCELEX) 10 MG troche   Oral   Take 10 mg by mouth as needed.            [name] co-enzyme Q-10 30 MG capsule   Oral   Take 100 mg by mouth daily.         [name] losartan-hydrochlorothiazide (HYZAAR) 100-12.5 MG per tablet   Oral   Take 100.25 tablets by mouth daily.          . nitroGLYCERIN (NITROSTAT) 0.4 MG SL tablet   Sublingual   Place 1 tablet (0.4 mg total) under the tongue every 5 (five) minutes as needed.   25 tablet   prn   . omeprazole (PRILOSEC) 20 MG capsule      20 mg daily.          . potassium chloride (K-DUR,KLOR-CON) 10 MEQ tablet   Oral   Take 1 tablet (10 mEq total) by mouth daily.   30 tablet   6     MAY HAVE 90 DAY SUPPLY   . PRENATAL VITAMINS PO   Oral   Take 1 tablet by mouth daily.           . VOLTAREN 1 % GEL      daily.          There were no vitals taken for this visit. Physical Exam  Nursing note and vitals reviewed. Constitutional: She is oriented to person, place, and time. She appears well-developed and well-nourished. No distress.  HENT:  Head: Normocephalic and atraumatic.  Eyes: EOM are normal. Pupils are equal, round, and reactive to light.  Cardiovascular: Normal rate, regular rhythm, normal heart sounds and intact distal pulses.  Exam reveals no friction rub.   No murmur heard. Pulmonary/Chest: Effort normal and breath sounds normal. She has no wheezes. She has no rales. She exhibits no tenderness.  Well healed midline sternotomy scar  Abdominal: Soft. Bowel sounds are normal. She exhibits no distension. There is no tenderness. There is no rebound and no guarding.  Musculoskeletal: Normal range of motion. She exhibits no tenderness.  No edema  Neurological: She is alert and oriented to person, place, and time. No cranial nerve deficit.  Skin: Skin is warm and dry. No rash noted.  Psychiatric: She has a normal mood and affect. Her behavior is normal.    ED Course  Procedures (including critical care time) Labs Reviewed  BASIC METABOLIC PANEL - Abnormal; Notable for the following:     Potassium 3.4 (*)    Glucose, Bld 116 (*)  GFR calc non Af Amer 64 (*)    GFR calc Af Amer 74 (*)    All other components within normal limits  CBC WITH DIFFERENTIAL - Abnormal; Notable for the following:    Hemoglobin 15.1 (*)    All other components within normal limits  APTT - Abnormal; Notable for the following:    aPTT 41 (*)    All other components within normal limits  HEPATIC FUNCTION PANEL - Abnormal; Notable for the following:    AST 68 (*)    ALT 76 (*)    All other components within normal limits  TROPONIN I  PROTIME-INR   Dg Chest 2 View  06/09/2013   *RADIOLOGY REPORT*  Clinical Data: Chest pain  CHEST - 2 VIEW  Comparison: 12/17/2017  Findings: Normal heart size.  No pleural effusion or edema.  No airspace consolidation.  Previous median sternotomy and CABG procedure.  IMPRESSION:  1.  No acute cardiopulmonary abnormalities.   Original Report Authenticated By: Signa Kell, M.D.     Date: 06/09/2013  Rate: 75  Rhythm: normal sinus rhythm  QRS Axis: normal  Intervals: normal  ST/T Wave abnormalities: normal  Conduction Disutrbances: none  Narrative Interpretation: unremarkable     1. Unstable angina     MDM   Pt with symptoms concerning for ACS with dull aching pain in the left chest with radiation to the left jaw, shoulder and under the shoulder blade.  Some clamminess but no other associated sx.  Pt is a vasculopath with 5 vessel CABG, AAA with stenting and PAD.   ASA > 325mg  take at home prior to arrival.  Tried NTG when pain started with only minimal relief.  No associated with food and no better after antiacids.  Has myoview stress test last week that was wnl, however pt states the same thing happened before her CABG 19 years ago and when they did the cath she had multivessel disease.  EKG here wnl.   NTG given. EKG, CXR, CBC, CMP, troponin, Coags pending.  Concern for ACS.  2:11 PM EKG wnl.  CXR wnl.  Labs all wnl except for mildly elevated LFTs  which are unchanged.  No improvement with GI cocktail or NTG.  2:27 PM Spoke with Dr. Derl Barrow and requested that pt start heparin gtt.  Pt not given bblocker due to allergy to metoprolol.  Fentanyl given for pain due to morphine allergy.  Admit to stepdown bed.  CRITICAL CARE Performed by: Gwyneth Sprout Total critical care time: 30 Critical care time was exclusive of separately billable procedures and treating other patients. Critical care was necessary to treat or prevent imminent or life-threatening deterioration. Critical care was time spent personally by me on the following activities: development of treatment plan with patient and/or surrogate as well as nursing, discussions with consultants, evaluation of patient's response to treatment, examination of patient, obtaining history from patient or surrogate, ordering and performing treatments and interventions, ordering and review of laboratory studies, ordering and review of radiographic studies, pulse oximetry and re-evaluation of patient's condition.   Gwyneth Sprout, MD 06/09/13 1428  Gwyneth Sprout, MD 06/09/13 1429  Gwyneth Sprout, MD 06/09/13 1433

## 2013-06-09 NOTE — Progress Notes (Signed)
ANTICOAGULATION CONSULT NOTE - Initial Consult  Pharmacy Consult for Heparin Indication: chest pain/ACS  Allergies  Allergen Reactions  . Bextra (Valdecoxib)     Increased lft's  . Codeine     REACTION: rash, itching  . Lisinopril Cough  . Metoprolol     Hair loss  . Mevacor (Lovastatin)     "arthritis"  . Morphine And Related Nausea Only  . Plavix (Clopidogrel Bisulfate)     Itching of hands and feet  . Zocor (Simvastatin)     Bones hurt   . Hydrocod Polst-Cpm Polst Er Rash    Patient Measurements: Height: 5' 1.5" (156.2 cm) Weight: 164 lb (74.39 kg) IBW/kg (Calculated) : 48.95  Vital Signs: Temp: 98.3 F (36.8 C) (07/05 1306) BP: 170/46 mmHg (07/05 1715) Pulse Rate: 68 (07/05 1715)  Labs:  Recent Labs  06/09/13 1304  HGB 15.1*  HCT 42.8  PLT 177  APTT 41*  LABPROT 13.1  INR 1.01  CREATININE 0.90  TROPONINI <0.Megan    Estimated Creatinine Clearance: 55.9 ml/min (by C-G formula based on Cr of 0.9).   Medical History: Past Medical History  Diagnosis Date  . Coronary artery disease   . Renal artery stenosis   . Hypertension   . Hyperlipidemia   . Osteoarthritis   . Fibromyalgia   . Eczema, dyshidrotic     Medications:  Prescriptions prior to admission  Medication Sig Dispense Refill  . ALPRAZolam (XANAX) 0.25 MG tablet Take 0.25 mg by mouth at bedtime as needed for sleep.      [name] amLODipine (NORVASC) 5 MG tablet Take 1 tablet (5 mg total) by mouth daily.  Megan tablet  11  . aspirin 325 MG tablet Take 325 mg by mouth daily.        [name] atorvastatin (LIPITOR) 40 MG tablet Take 1 tablet (40 mg total) by mouth daily.  Megan tablet  11  . Cholecalciferol (VITAMIN D3) 5000 UNITS TABS Take 1 tablet by mouth once a week. Take on Friday      . clindamycin (CLINDAGEL) 1 % gel Apply 1 application topically 2 (two) times daily as needed (for yeast infection).       . clotrimazole (MYCELEX) 10 MG troche Take 10 mg by mouth as needed (Three times daily as needed for  yeast infection).       . Coenzyme Q10 (CO Q 10) 100 MG CAPS Take 1 capsule by mouth daily.      [name] losartan-hydrochlorothiazide (HYZAAR) 100-12.5 MG per tablet Take 1 tablet by mouth daily.       . nitroGLYCERIN (NITROSTAT) 0.4 MG SL tablet Place 0.4 mg under the tongue every 5 (five) minutes as needed for chest pain.      [name] omeprazole (PRILOSEC) 20 MG capsule Take 20 mg by mouth daily.       . potassium chloride (K-DUR,KLOR-CON) 10 MEQ tablet Take 1 tablet (10 mEq total) by mouth daily.  Megan tablet  6  . PRENATAL VITAMINS PO Take 1 tablet by mouth daily.        . VOLTAREN 1 % GEL Apply 2 g topically daily as needed (for joint pain).         Assessment: Megan [name] admitted 06/09/2013 with CP.  Pharmacy consulted to dose heaprin.  PMH: CAD (CABG 95), HTN, hyperlipidemia, RA stent + graft (09), PVD, OA, fibromyalgia, exzema, Allergy to multiple meds including ACEI  Coag: ACS, heparin started at [facility], no bleeding noted  Goal of Therapy:  Heparin  level 0.3-0.7 units/ml Monitor platelets by anticoagulation protocol: Yes   Plan:  1. Continue heparin at 1000 unit/h as started at 1500 at [facility]. 2. Check heparin level at 2100 tonight. 3. Daily heparin level and CBC  Thank you for allowing pharmacy to be a part of this patients care team.  Lovenia Kim Pharm.D., BCPS Clinical Pharmacist 06/09/2013 5:40 PM Pager: [phone] Phone: [phone]

## 2013-06-09 NOTE — Progress Notes (Signed)
ANTICOAGULATION CONSULT NOTE - Follow Up Consult  Pharmacy Consult for heparin Indication: chest pain/ACS  Labs:  Recent Labs  06/09/13 1304 06/09/13 1853 06/09/13 2100 06/09/13 2140  HGB 15.1*  --   --   --   HCT 42.8  --   --   --   PLT 177  --   --   --   APTT 41*  --   --   --   LABPROT 13.1  --   --   --   INR 1.01  --   --   --   HEPARINUNFRC  --   --  0.22*  --   CREATININE 0.90  --   --   --   TROPONINI <0.30 <0.30  --  <0.30    Assessment: 66yo female subtherapeutic on heparin with initial dosing for CP.  Goal of Therapy:  Heparin level 0.3-0.7 units/ml   Plan:  Will increase heparin gtt by 2-3 units/kg/hr to 1200 units/hr and check level w/ am labs.  Vernard Gambles, PharmD, BCPS  06/09/2013,10:32 PM

## 2013-06-09 NOTE — H&P (Signed)
History and Physical   Admit date: 06/09/2013 Name:  Megan [name] Medical record number: [id number] DOB/Age:  [birth year]  67 y.o. female  Ref[facility] Phys[facility]  [facility]  Primary Cardiologist: Dr. Cassell Clement  Primary Physician:  Dr. Asencion Partridge  Chief complaint/reason for admission: Chest pain  HPI:  This 67 year-old female had previous bypass grafting in 1995. She has had hypertension, hyperlipidemia and renal artery stenting in 2008 in the care of a aneurysm with stent graft in 2009. She normally exercises and does well. She recently had a myocardial perfusion scan done on June 23 showed an ejection fraction of 81% with normal myocardial perfusion. She does have some mild claudication involving her left leg  Last evening she developed an aching pain in her left substernal area with some radiation underneath her left scapula and took 2 nitroglycerin. She had recurrence of the discomfort this morning and states that it was similar to the previous discomfort she had when she had bypass grafting. She took 3 nitroglycerin this morning and then presented to [facility] emergency room where initial enzymes were negative. An EKG showed some minor ST depression. She was started on heparin and transferred here for further evaluation. She currently is pain-free. She denies PND, orthopnea, or syncope.    Past Medical History  Diagnosis Date  . Coronary artery disease   . Renal artery stenosis   . Hypertension   . Hyperlipidemia   . Osteoarthritis   . Fibromyalgia   . Eczema, dyshidrotic       Past Surgical History  Procedure Laterality Date  . Renal artery stent  2008  . 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 Carpal tunnel release  08/1993    left wrist  . Tonsilectomy, adenoidectomy, bilateral myringotomy and tubes  1989  . Total abdominal hysterectomy  1992  . Carpal tunnel release  01/1997    right  . Thyroid cyst apiration   05/1997 and 07/1998  . Cardiovascular stress test  11/11/2009    normal study  . Cardiac catheterization    . Abdominal aortic aneurysm repair  2009    stenting   Allergies: is allergic to bextra; codeine; lisinopril; metoprolol; mevacor; morphine and related; plavix; zocor; and hydrocod polst-cpm polst er.   Medications: Prior to Admission medications   Medication Sig Start Date End Date Taking? Authorizing Provider  ALPRAZolam Prudy Feeler) 0.25 MG tablet Take 0.25 mg by mouth at bedtime as needed for sleep.   Yes Historical Provider, MD  amLODipine (NORVASC) 5 MG tablet Take 1 tablet (5 mg total) by mouth daily. 01/17/13  Yes Cassell Clement, MD  aspirin 325 MG tablet Take 325 mg by mouth daily.     Yes Historical Provider, MD  atorvastatin (LIPITOR) 40 MG tablet Take 1 tablet (40 mg total) by mouth daily. 05/29/13 05/29/14 Yes Cassell Clement, MD  Cholecalciferol (VITAMIN D3) 5000 UNITS TABS Take 1 tablet by mouth once a week. Take on Friday   Yes Historical Provider, MD  clindamycin (CLINDAGEL) 1 % gel Apply 1 application topically 2 (two) times daily as needed (for yeast infection).  08/12/12  Yes Historical Provider, MD  clotrimazole (MYCELEX) 10 MG troche Take 10 mg by mouth as needed (Three times daily as needed for yeast infection).    Yes Historical Provider, MD  Coenzyme Q10 (CO Q 10) 100 MG CAPS Take 1 capsule by mouth daily.   Yes Historical Provider, MD  losartan-hydrochlorothiazide (HYZAAR) 100-12.5 MG per tablet Take 1 tablet by mouth daily.    Yes Historical Provider, MD  nitroGLYCERIN (NITROSTAT) 0.4 MG SL tablet Place 0.4 mg under the tongue every 5 (five) minutes as needed for chest pain.   Yes Historical Provider, MD  omeprazole (PRILOSEC) 20 MG capsule Take 20 mg by mouth daily.    Yes Historical Provider, MD  potassium chloride (K-DUR,KLOR-CON) 10 MEQ tablet Take 1 tablet (10 mEq total) by mouth daily. 05/21/13  Yes Cassell Clement, MD  PRENATAL VITAMINS PO Take 1 tablet by mouth  daily.     Yes Historical Provider, MD  VOLTAREN 1 % GEL Apply 2 g topically daily as needed (for joint pain).  08/09/12  Yes Historical Provider, MD   Family History:  Family Status  Relation Status Death Age  . Mother Deceased   . Father Deceased   . Sister Alive   . Brother Alive    Social History:   reports that she quit smoking about 17 years ago. She has never used smokeless tobacco. She reports that she drinks about 2-3 glasses of wine per day. She reports that she does not use illicit drugs.   History   Social History Narrative  . No narrative on file     Review of Systems: She recently was evaluated for elevated liver enzymes Dr. Patty Sermons and told to reduce her alcohol intake. She switched from drinking hard liquor to wine. She does have some claudication involving her left leg. She has occasional dyspepsia and has had some bloating over the past month or so.  Other than as noted above, the remainder of the review of systems is normal  Physical Exam: BP 132/94  Pulse 63  Temp(Src) 98.3 F (36.8 C)  Resp 18  Ht 5' 1.5" (1.562 m)  Wt 74.39 kg (164 lb)  BMI 30.49 kg/m2  SpO2 97% General appearance: Pleasant white female in no acute distress mildly obese Head: Normocephalic, without obvious abnormality, atraumatic Eyes: conjunctivae/corneas clear. PERRL, EOM's intact. Fundi not examined  Throat: lips, mucosa, and tongue normal; teeth and gums normal Neck: no adenopathy, no carotid bruit, no JVD and supple, symmetrical, trachea midline Lungs: clear to auscultation bilaterally, healed median sternotomy scar Heart: regular rate and rhythm, S1, S2 normal, no murmur, click, rub or gallop Abdomen: soft, non-tender; bowel sounds normal; no masses,  no organomegaly Pelvic: deferred Extremities: extremities normal, atraumatic, no cyanosis or edema, prior saphenous vein harvesting scars on right leg Pulses: Tercel is pedis and posterior tibial 2+ in the right, diminished on the  left Skin: Skin color, texture, turgor normal. No rashes or lesions Neurologic: Grossly normal  Labs: CBC  Recent Labs  06/09/13 1304  WBC 9.3  RBC 4.84  HGB 15.1*  HCT 42.8  PLT 177  MCV 88.4  MCH 31.2  MCHC 35.3  RDW 13.3  LYMPHSABS 3.1  MONOABS 0.7  EOSABS 0.2  BASOSABS 0.0   CMP   Recent Labs  06/09/13 1304  NA 142  K 3.4*  CL 101  CO2 32  GLUCOSE 116*  BUN 9  CREATININE 0.90  CALCIUM 9.8  PROT 6.5  ALBUMIN 3.7  AST 68*  ALT 76*  ALKPHOS 87  BILITOT 0.4  GFRNONAA 64*  GFRAA 74*   Cardiac Panel (last 3 results)  Recent Labs  06/09/13 1304  TROPONINI <0.30   EKG: Normal sinus rhythm, nonspecific ST and T-wave changes  Radiology: Previous median sternotomy, clear lungs   IMPRESSIONS: 1. Unstable  angina pectoris 2. Coronary artery disease with previous bypass grafting 3. Hypertension 4. Hyperlipidemia under treatment 5. Peripheral vascular disease with claudication 6. Previous abdominal aneurysm repair with stent grafting 7. Previous renal artery stent 8. Fatty liver 9. Elevated liver enzymes  PLAN: Continue intravenous heparin over the weekend. She has chest pain suggestive of recurrent angina-like pattern she had prior to her bypass grafting. Despite a negative stress test recently, will need catheterization to determine if she has graft disease.  Need to obtain old records regarding location of grafts and operative note.  Signed: Darden Palmer MD [facility]  06/09/2013, 5:18 PM

## 2013-06-10 LAB — TROPONIN I: Troponin I: <0.3 ng/mL (ref <0.30)

## 2013-06-10 LAB — HEPARIN LEVEL (UNFRACTIONATED)
Heparin Unfractionated: 0.57 IU/mL (ref 0.30–0.70)
Heparin Unfractionated: 0.66 IU/mL (ref 0.30–0.70)

## 2013-06-10 LAB — CBC
HCT: 42.6 % (ref 36.0–46.0)
Hemoglobin: 14.4 g/dL (ref 12.0–15.0)
MCH: 30.1 pg (ref 26.0–34.0)
MCHC: 33.8 g/dL (ref 30.0–36.0)
MCV: 89.1 fL (ref 78.0–100.0)
Platelets: 156 10*3/uL (ref 150–400)
RBC: 4.78 MIL/uL (ref 3.87–5.11)
RDW: 13.3 % (ref 11.5–15.5)
WBC: 6.9 10*3/uL (ref 4.0–10.5)

## 2013-06-10 MED ORDER — ASPIRIN 81 MG PO CHEW
324.0000 mg | CHEWABLE_TABLET | ORAL | Status: AC
  Administered 2013-06-11: 324 mg via ORAL
  Filled 2013-06-10: qty 4

## 2013-06-10 MED ORDER — SODIUM CHLORIDE 0.9 % IV SOLN
1.0000 mL/kg/h | INTRAVENOUS | Status: DC
Start: 2013-06-11 — End: 2013-06-11

## 2013-06-10 MED ORDER — GI COCKTAIL ~~LOC~~
30.0000 mL | Freq: Two times a day (BID) | ORAL | Status: DC | PRN
  Administered 2013-06-10: 30 mL via ORAL
  Filled 2013-06-10 (×2): qty 30

## 2013-06-10 MED ORDER — SIMETHICONE 80 MG PO CHEW
80.0000 mg | CHEWABLE_TABLET | Freq: Once | ORAL | Status: AC
  Administered 2013-06-10: 80 mg via ORAL
  Filled 2013-06-10: qty 1

## 2013-06-10 MED ORDER — DIAZEPAM 5 MG PO TABS
10.0000 mg | ORAL_TABLET | ORAL | Status: AC
  Administered 2013-06-11: 10 mg via ORAL
  Filled 2013-06-10: qty 2

## 2013-06-10 MED ORDER — ALUM & MAG HYDROXIDE-SIMETH 200-200-20 MG/5ML PO SUSP
30.0000 mL | ORAL | Status: DC | PRN
  Administered 2013-06-10: 30 mL via ORAL
  Filled 2013-06-10: qty 30

## 2013-06-10 NOTE — Progress Notes (Signed)
ANTICOAGULATION CONSULT NOTE - Follow Up Consult  Pharmacy Consult for Heparin Indication: chest pain/ACS  Allergies  Allergen Reactions  . Bextra (Valdecoxib)     Increased lft's  . Codeine     REACTION: rash, itching  . Lisinopril Cough  . Metoprolol     Hair loss  . Mevacor (Lovastatin)     "arthritis"  . Morphine And Related Nausea Only  . Plavix (Clopidogrel Bisulfate)     Itching of hands and feet  . Zocor (Simvastatin)     Bones hurt   . Hydrocod Polst-Cpm Polst Er Rash    Patient Measurements: Height: 5\' 1"  (154.9 cm) Weight: 164 lb 10.9 oz (74.7 kg) IBW/kg (Calculated) : 47.8 Heparin Dosing Weight: 64 kg  Vital Signs: Temp: 97.5 F (36.4 C) (07/06 1201) Temp src: Oral (07/06 1201) BP: 170/45 mmHg (07/06 1201) Pulse Rate: 57 (07/06 1201)  Labs:  Recent Labs  06/09/13 1304 06/09/13 1853 06/09/13 2100 06/09/13 2140 06/10/13 0500 06/10/13 0532  HGB 15.1*  --   --   --  14.4  --   HCT 42.8  --   --   --  42.6  --   PLT 177  --   --   --  156  --   APTT 41*  --   --   --   --   --   LABPROT 13.1  --   --   --   --   --   INR 1.01  --   --   --   --   --   HEPARINUNFRC  --   --  0.22*  --  0.57  --   CREATININE 0.90  --   --   --   --   --   TROPONINI <0.30 <0.30  --  <0.30  --  <0.30    Estimated Creatinine Clearance: 55.3 ml/min (by C-G formula based on Cr of 0.9).   Medications:  Heparin @ 1200 units/hr (12 ml/hr)  Assessment: 67 y.o. F who continues on heparin for [place] while awaiting possible cardiac cath on Monday, 7/7. Heparin level this morning was therapeutic at 0.57, repeat level to confirm this afternoon was also therapeutic at 0.66. Hgb/Hct/Plt wnl, no overt s/sx of bleeding noted. Will decrease heparin drip rate slightly to keep within goal range.  Goal of Therapy:  Heparin level 0.3-0.7 units/ml Monitor platelets by anticoagulation protocol: Yes   Plan:  1. Decrease heparin drip rate to 1150 units/hr (11.5 ml/hr) 2. Will continue  to monitor for any signs/symptoms of bleeding and will follow up with heparin level in the a.m.   Georgina Pillion, PharmD, BCPS Clinical Pharmacist Pager: [phone] 06/10/2013 12:53 PM

## 2013-06-10 NOTE — Progress Notes (Addendum)
Subjective:  No aching today and says chest feels better.   Objective:  Vital Signs in the last 24 hours: BP 158/90  Pulse 61  Temp(Src) 98.7 F (37.1 C) (Oral)  Resp 19  Ht 5\' 1"  (1.549 m)  Wt 74.7 kg (164 lb 10.9 oz)  BMI 31.13 kg/m2  SpO2 96%  Physical Exam: Pleasant WF in NAD Lungs:  Clear  Cardiac:  Regular rhythm, normal S1 and S2, no S3 Extremities:  No edema present  Intake/Output from previous day: 07/05 0701 - 07/06 0700 In: 889.2 [P.O.:590; I.V.:299.2] Out: 250 [Urine:250]  Weight Filed Weights   06/09/13 1316 06/09/13 1744 06/10/13 0500  Weight: 74.39 kg (164 lb) 74.5 kg (164 lb 3.9 oz) 74.7 kg (164 lb 10.9 oz)    Lab Results: Basic Metabolic Panel:  Recent Labs  19/14/78 1304  NA 142  K 3.4*  CL 101  CO2 32  GLUCOSE 116*  BUN 9  CREATININE 0.90   CBC:  Recent Labs  06/09/13 1304 06/10/13 0500  WBC 9.3 6.9  NEUTROABS 5.1  --   HGB 15.1* 14.4  HCT 42.8 42.6  MCV 88.4 89.1  PLT 177 156   Cardiac Enzymes:  Recent Labs  06/09/13 1853 06/09/13 2140 06/10/13 0532  TROPONINI <0.30 <0.30 <0.30    Telemetry: Sinus rate of 73  Tele:  NSR  Assessment/Plan:  1. Unstable angina pectoris  2. Coronary artery disease with previous bypass grafting  3. Hypertension  4. Hyperlipidemia under treatment  5. Peripheral vascular disease with claudication  PLAN:   Enzymes negative EKG improved.  Cardiac catheterization was discussed with the patient fully including risks of myocardial infarction, death, stroke, bleeding, arrhythmia, dye allergy, renal insufficiency or bleeding.  The patient understands and is willing to proceed.  Vesta Mixer, Montez Hageman., MD, [facility] 06/11/2013, 9:25 AM Office - [phone] Pager [phone]

## 2013-06-11 ENCOUNTER — Encounter (HOSPITAL_COMMUNITY): Payer: Self-pay | Admitting: Nurse Practitioner

## 2013-06-11 ENCOUNTER — Encounter (HOSPITAL_COMMUNITY)
Admission: EM | Disposition: A | Discharge status: Home / Self Care | Payer: Self-pay | Source: Home / Self Care | Attending: Internal Medicine

## 2013-06-11 DIAGNOSIS — I200 Unstable angina: Secondary | ICD-10-CM

## 2013-06-11 DIAGNOSIS — I2510 Atherosclerotic heart disease of native coronary artery without angina pectoris: Secondary | ICD-10-CM | POA: Diagnosis present

## 2013-06-11 DIAGNOSIS — E785 Hyperlipidemia, unspecified: Secondary | ICD-10-CM

## 2013-06-11 HISTORY — PX: LEFT HEART CATHETERIZATION WITH CORONARY/GRAFT ANGIOGRAM: SHX5450

## 2013-06-11 LAB — HEPARIN LEVEL (UNFRACTIONATED): Heparin Unfractionated: 0.59 IU/mL (ref 0.30–0.70)

## 2013-06-11 LAB — CBC
HCT: 41.2 % (ref 36.0–46.0)
Hemoglobin: 13.8 g/dL (ref 12.0–15.0)
MCH: 29.8 pg (ref 26.0–34.0)
MCHC: 33.5 g/dL (ref 30.0–36.0)
MCV: 89 fL (ref 78.0–100.0)
Platelets: 150 10*3/uL (ref 150–400)
RBC: 4.63 MIL/uL (ref 3.87–5.11)
RDW: 13.5 % (ref 11.5–15.5)
WBC: 7.4 10*3/uL (ref 4.0–10.5)

## 2013-06-11 LAB — POCT ACTIVATED CLOTTING TIME: Activated Clotting Time: 150 seconds

## 2013-06-11 LAB — POTASSIUM: Potassium: 4.7 mEq/L (ref 3.5–5.1)

## 2013-06-11 SURGERY — LEFT HEART CATHETERIZATION WITH CORONARY/GRAFT ANGIOGRAM
Anesthesia: LOCAL

## 2013-06-11 MED ORDER — ACETAMINOPHEN 325 MG PO TABS: 650.0000 mg | ORAL_TABLET | ORAL | Status: DC | PRN

## 2013-06-11 MED ORDER — HEPARIN (PORCINE) IN NACL 2-0.9 UNIT/ML-% IJ SOLN
INTRAMUSCULAR | Status: AC
  Filled 2013-06-11: qty 1000

## 2013-06-11 MED ORDER — SODIUM CHLORIDE 0.9 % IV SOLN: INTRAVENOUS | Status: DC

## 2013-06-11 MED ORDER — MIDAZOLAM HCL 2 MG/2ML IJ SOLN
INTRAMUSCULAR | Status: AC
  Filled 2013-06-11: qty 2

## 2013-06-11 MED ORDER — LIDOCAINE HCL (PF) 1 % IJ SOLN
INTRAMUSCULAR | Status: AC
  Filled 2013-06-11: qty 30

## 2013-06-11 MED ORDER — ONDANSETRON HCL 4 MG/2ML IJ SOLN: 4.0000 mg | Freq: Four times a day (QID) | INTRAMUSCULAR | Status: DC | PRN

## 2013-06-11 MED ORDER — FENTANYL CITRATE 0.05 MG/ML IJ SOLN
INTRAMUSCULAR | Status: AC
  Filled 2013-06-11: qty 2

## 2013-06-11 MED ORDER — NITROGLYCERIN 0.2 MG/ML ON CALL CATH LAB
INTRAVENOUS | Status: AC
  Filled 2013-06-11: qty 1

## 2013-06-11 NOTE — Discharge Summary (Signed)
Patient ID: Megan [name],  MRN: [id number], DOB/AGE: 12-18-1945 67 y.o.  Admit date: 06/09/2013 Discharge date: 06/11/2013  Primary Care Provider: Willow Ora Primary Cardiologist: Wylene Simmer, MD   Discharge Diagnoses Principal Problem:   Unstable angina  **s/p cardiac catheterization this admission with stable anatomy, patent grafts to the RCA and atretic LIMA->LAD.  Active Problems:   Coronary artery disease   Hyperlipidemia   History of abdominal aortic aneurysm repair   History of renal artery stenosis   Peripheral vascular disease with claudication  Allergies Allergies  Allergen Reactions  . Bextra (Valdecoxib)     Increased lft's  . Codeine     REACTION: rash, itching  . Lisinopril Cough  . Metoprolol     Hair loss  . Mevacor (Lovastatin)     "arthritis"  . Morphine And Related Nausea Only  . Plavix (Clopidogrel Bisulfate)     Itching of hands and feet  . Zocor (Simvastatin)     Bones hurt   . Hydrocod Polst-Cpm Polst Er Rash   Procedures  Cardiac Catheterization 06/11/2013  Hemodynamics:    LV pressure: 129/14 Aortic pressure: 130/58  Angiography    Left Main: moderate sized, smooth and normal Left anterior Descending: proximal 40-50% stenosis,  Minor irregularities.  Competitive filling of the LIMA can be seen.  diags are normal Left Circumflex: moderate in size, normal   Right Coronary Artery: 80 % proximal stenosis, followed by mid occlusion ( chronic occlusion) SVG to RCA:  Minor irregularities,  Anastomosis to PDA and PLSA are normal.  LIMA to LAD:  Small, atretic. The is competitive filling of the distal LAD  LV Gram: vigorous LV function.   Complications: No apparent complications Patient did tolerate procedure well. _____________   History of Present Illness  67 year old female with prior history of coronary artery disease as well as peripheral vascular disease, hypertension, and hyperlipidemia who recently had a negative Cardiolite in  June of this year.  She was in her usual state of health until the day prior to admission which began to experience left sided chest discomfort with radiation underneath her left scapula. This resolved with 2 sublingual nitroglycerin tablets however she had recurrent discomfort on the morning of admission prompting her to present to the med center at [facility] where ECG was nonacute and initial enzymes negative. She was placed on heparin and transferred to [facility] for further evaluation.  Hospital Course  Patient ruled out for myocardial infarction. She had no further chest discomfort. She reported that symptoms felt similar to what she states prior to her bypass surgery and as result, decision was made to pursue diagnostic catheterization. This was undertaken earlier today and revealed stable anatomy as outlined above. Continue medical therapy has been recommended. She'll be discharged home this afternoon in good condition.  Discharge Vitals Blood pressure 139/58, pulse 66, temperature 97.4 F (36.3 C), temperature source Oral, resp. rate 18, height 5\' 1"  (1.549 m), weight 164 lb 10.9 oz (74.7 kg), SpO2 95.00%.  Filed Weights   06/09/13 1316 06/09/13 1744 06/10/13 0500  Weight: 164 lb (74.39 kg) 164 lb 3.9 oz (74.5 kg) 164 lb 10.9 oz (74.7 kg)   Labs  CBC  Recent Labs  06/09/13 1304 06/10/13 0500 06/11/13 0408  WBC 9.3 6.9 7.4  NEUTROABS 5.1  --   --   HGB 15.1* 14.4 13.8  HCT 42.8 42.6 41.2  MCV 88.4 89.1 89.0  PLT 177 156 150   Basic Metabolic Panel  Recent Labs  06/09/13 1304 06/11/13 0750  NA 142  --   K 3.4* 4.7  CL 101  --   CO2 32  --   GLUCOSE 116*  --   BUN 9  --   CREATININE 0.90  --   CALCIUM 9.8  --    Liver Function Tests  Recent Labs  06/09/13 1304  AST 68*  ALT 76*  ALKPHOS 87  BILITOT 0.4  PROT 6.5  ALBUMIN 3.7   Cardiac Enzymes  Recent Labs  06/09/13 1853 06/09/13 2140 06/10/13 0532  TROPONINI <0.30 <0.30 <0.30   Disposition  Pt  is being discharged home today in good condition.  Follow-up Plans & Appointments  Follow-up Information   Follow up with Norma Fredrickson, NP On 07/02/2013. (9:00 AM)    Contact information:   [address] [place] [address] [phone]      Discharge Medications    Medication List         ALPRAZolam 0.25 MG tablet  Commonly known as:  XANAX  Take 0.25 mg by mouth at bedtime as needed for sleep.     amLODipine 5 MG tablet  Commonly known as:  NORVASC  Take 1 tablet (5 mg total) by mouth daily.     aspirin 325 MG tablet  Take 325 mg by mouth daily.     atorvastatin 40 MG tablet  Commonly known as:  LIPITOR  Take 1 tablet (40 mg total) by mouth daily.     clindamycin 1 % gel  Commonly known as:  CLINDAGEL  Apply 1 application topically 2 (two) times daily as needed (for yeast infection).     clotrimazole 10 MG troche  Commonly known as:  MYCELEX  Take 10 mg by mouth as needed (Three times daily as needed for yeast infection).     Co Q 10 100 MG Caps  Take 1 capsule by mouth daily.     losartan-hydrochlorothiazide 100-12.5 MG per tablet  Commonly known as:  HYZAAR  Take 1 tablet by mouth daily.     nitroGLYCERIN 0.4 MG SL tablet  Commonly known as:  NITROSTAT  Place 0.4 mg under the tongue every 5 (five) minutes as needed for chest pain.     omeprazole 20 MG capsule  Commonly known as:  PRILOSEC  Take 20 mg by mouth daily.     potassium chloride 10 MEQ tablet  Commonly known as:  K-DUR,KLOR-CON  Take 1 tablet (10 mEq total) by mouth daily.     PRENATAL VITAMINS PO  Take 1 tablet by mouth daily.     Vitamin D3 5000 UNITS Tabs  Take 1 tablet by mouth once a week. Take on Friday     VOLTAREN 1 % Gel  Generic drug:  diclofenac sodium  Apply 2 g topically daily as needed (for joint pain).       Outstanding Labs/Studies  none  Duration of Discharge Encounter   Greater than 30 minutes including physician  time.  Signed, Nicolasa Ducking NP 06/11/2013, 11:55 AM   Attending Note:   The patient was seen and examined.  Agree with assessment and plan as noted above.  Changes made to the above note as needed.  See my note from earlier today and cath note.   Vesta Mixer, Montez Hageman., MD, [facility] 06/11/2013, 12:24 PM

## 2013-06-11 NOTE — H&P (View-Only) (Signed)
Subjective:  No aching today and says chest feels better.   Objective:  Vital Signs in the last 24 hours: BP 158/90  Pulse 61  Temp(Src) 98.7 F (37.1 C) (Oral)  Resp 19  Ht 5' 1" (1.549 m)  Wt 74.7 kg (164 lb 10.9 oz)  BMI 31.13 kg/m2  SpO2 96%  Physical Exam: Pleasant WF in NAD Lungs:  Clear  Cardiac:  Regular rhythm, normal S1 and S2, no S3 Extremities:  No edema present  Intake/Output from previous day: 07/05 0701 - 07/06 0700 In: 889.2 [P.O.:590; I.V.:299.2] Out: 250 [Urine:250]  Weight Filed Weights   06/09/13 1316 06/09/13 1744 06/10/13 0500  Weight: 74.39 kg (164 lb) 74.5 kg (164 lb 3.9 oz) 74.7 kg (164 lb 10.9 oz)    Lab Results: Basic Metabolic Panel:  Recent Labs  06/09/13 1304  NA 142  K 3.4*  CL 101  CO2 32  GLUCOSE 116*  BUN 9  CREATININE 0.90   CBC:  Recent Labs  06/09/13 1304 06/10/13 0500  WBC 9.3 6.9  NEUTROABS 5.1  --   HGB 15.1* 14.4  HCT 42.8 42.6  MCV 88.4 89.1  PLT 177 156   Cardiac Enzymes:  Recent Labs  06/09/13 1853 06/09/13 2140 06/10/13 0532  TROPONINI <0.30 <0.30 <0.30    Telemetry: Sinus rate of 73  Tele:  NSR  Assessment/Plan:  1. Unstable angina pectoris  2. Coronary artery disease with previous bypass grafting  3. Hypertension  4. Hyperlipidemia under treatment  5. Peripheral vascular disease with claudication  PLAN:   Enzymes negative EKG improved.  Cardiac catheterization was discussed with the patient fully including risks of myocardial infarction, death, stroke, bleeding, arrhythmia, dye allergy, renal insufficiency or bleeding.  The patient understands and is willing to proceed.  Philip J. Nahser, Jr., MD, FACC 06/11/2013, 9:25 AM Office - [phone] Pager [phone]     

## 2013-06-11 NOTE — Progress Notes (Signed)
Pt discharged to home per MD order. Pt received and reviewed all discharge instructions and medication information including follow-up appointments and prescriptions. Pt verbalized understanding. Pt groin site level 0 post-cath at discharge.  Pt alert and oriented at discharge with no complaints of pain. Pt escorted to private vehicle via wheelchair by nurse tech. Efraim Kaufmann

## 2013-06-11 NOTE — Progress Notes (Signed)
ANTICOAGULATION CONSULT NOTE - Follow Up Consult  Pharmacy Consult for Heparin Indication: chest pain/ACS  Allergies  Allergen Reactions  . Bextra (Valdecoxib)     Increased lft's  . Codeine     REACTION: rash, itching  . Lisinopril Cough  . Metoprolol     Hair loss  . Mevacor (Lovastatin)     "arthritis"  . Morphine And Related Nausea Only  . Plavix (Clopidogrel Bisulfate)     Itching of hands and feet  . Zocor (Simvastatin)     Bones hurt   . Hydrocod Polst-Cpm Polst Er Rash    Patient Measurements: Height: 5\' 1"  (154.9 cm) Weight: 164 lb 10.9 oz (74.7 kg) IBW/kg (Calculated) : 47.8 Heparin Dosing Weight: 64kg  Vital Signs: Temp: 97.4 F (36.3 C) (07/07 0732) Temp src: Oral (07/07 0732) BP: 165/68 mmHg (07/07 0732) Pulse Rate: 60 (07/07 0732)  Labs:  Recent Labs  06/09/13 1304 06/09/13 1853  06/09/13 2140 06/10/13 0500 06/10/13 0532 06/10/13 1208 06/11/13 0408  HGB 15.1*  --   --   --  14.4  --   --  13.8  HCT 42.8  --   --   --  42.6  --   --  41.2  PLT 177  --   --   --  156  --   --  150  APTT 41*  --   --   --   --   --   --   --   LABPROT 13.1  --   --   --   --   --   --   --   INR 1.01  --   --   --   --   --   --   --   HEPARINUNFRC  --   --   < >  --  0.57  --  0.66 0.59  CREATININE 0.90  --   --   --   --   --   --   --   TROPONINI <0.30 <0.30  --  <0.30  --  <0.30  --   --   < > = values in this interval not displayed.  Estimated Creatinine Clearance: 55.3 ml/min (by C-G formula based on Cr of 0.9).   Medications:  Heparin @ 1150 units/hr  Assessment: 66yof continues on heparin for ACS sx awaiting cath today. Heparin level is therapeutic. CBC stable. No bleeding reported.  Goal of Therapy:  Heparin level 0.3-0.7 units/ml Monitor platelets by anticoagulation protocol: Yes   Plan:  1) Continue heparin at 1150 units/hr 2) Follow up after cath  Fredrik Rigger 06/11/2013,8:57 AM

## 2013-06-11 NOTE — CV Procedure (Addendum)
    Cardiac Cath Note  Megan [name] [id number] 29-Jun-1946  Procedure: Left  Heart Cardiac Catheterization Note Indications: CP  Procedure Details Consent: Obtained Time Out: Verified patient identification, verified procedure, site/side was marked, verified correct patient position, special equipment/implants available, Radiology Safety Procedures followed,  medications/allergies/relevent history reviewed, required imaging and test results available.  Performed   Medications: Fentanyl: 75 mg IV Versed: 3 mg IV  The right femoral artery was easily canulated using a modified Seldinger technique.  Hemodynamics:   LV pressure: 129/14 Aortic pressure: 130/58  Angiography   Left Main: moderate sized, smooth and normal  Left anterior Descending: proximal 40-50% stenosis,  Minor irregularities.  Competitive filling of the LIMA can be seen.  diags are normal  Left Circumflex: moderate in size, normal   Right Coronary Artery: 80 % proximal stenosis, followed by mid occlusion ( chronic occlusion)  SVG to RCA:  Minor irregularities,  Anastomosis to PDA and PLSA are normal.   LIMA to LAD:  Small, atretic. The is competitive filling of the distal LAD   LV Gram: vigorous LV function.  EF 70%.   Complications: No apparent complications Patient did tolerate procedure well.  Contrast used:  135 cc   Conclusions:  Home today.  Vesta Mixer, Montez Hageman., MD, [facility] 06/11/2013, 10:29 AM Office - [phone] Pager [phone]

## 2013-06-11 NOTE — Care Management Note (Signed)
    Page 1 of 1   06/11/2013     8:52:04 AM   CARE MANAGEMENT NOTE 06/11/2013  Patient:  Megan [name], Megan [name]   Account Number:  [id number]  Date Initiated:  06/11/2013  Documentation initiated by:  Junius Creamer  Subjective/Objective Assessment:   adm w angina     Action/Plan:   lives w husband, pcp dr Durward Mallard andy   Anticipated DC Date:     Anticipated DC Plan:  HOME/SELF CARE      DC Planning Services  CM consult      Choice offered to / List presented to:             Status of service:   Medicare Important Message given?   (If response is "NO", the following Medicare IM given date fields will be blank) Date Medicare IM given:   Date Additional Medicare IM given:    Discharge Disposition:    Per UR Regulation:  Reviewed for med. necessity/level of care/duration of stay  If discussed at Long Length of Stay Meetings, dates discussed:    Comments:

## 2013-06-11 NOTE — Interval H&P Note (Signed)
History and Physical Interval Note:  06/11/2013 9:38 AM  Carolynn Serve  has presented today for surgery, with the diagnosis of cp  The various methods of treatment have been discussed with the patient and family. After consideration of risks, benefits and other options for treatment, the patient has consented to  Procedure(s): LEFT HEART CATHETERIZATION WITH CORONARY/GRAFT ANGIOGRAM (N/A) as a surgical intervention .  The patient's history has been reviewed, patient examined, no change in status, stable for surgery.  I have reviewed the patient's chart and labs.  Questions were answered to the patient's satisfaction.     Elyn Aquas.

## 2013-06-12 ENCOUNTER — Telehealth: Payer: Self-pay | Admitting: Cardiology

## 2013-06-12 NOTE — Telephone Encounter (Signed)
Follow-up:    Patient called in again wanting to speak with you.  Please call back.

## 2013-06-12 NOTE — Telephone Encounter (Signed)
New Prob    Pt wanting to speak to nurse. Did not leave details about call.

## 2013-06-12 NOTE — Telephone Encounter (Signed)
Patient stated that Dr Elease Hashimoto had recommended that she see GI since cath was ok. Patient just wants to make sure  Dr. Patty Sermons is in agreement and has no other recommendation. Patient does see Dr Russella Dar for colonoscopies but would like to know who  Dr. Patty Sermons would suggest. Also if he would like to speak with her she will be available today or tomorrow. Will forward to  Dr. Patty Sermons for review

## 2013-06-13 ENCOUNTER — Encounter: Payer: Self-pay | Admitting: Gastroenterology

## 2013-06-13 NOTE — Telephone Encounter (Signed)
Left message to call back  

## 2013-06-13 NOTE — Telephone Encounter (Signed)
Advised patient

## 2013-06-13 NOTE — Telephone Encounter (Signed)
Yes, I agree with GI consult with Dr. Russella Dar

## 2013-06-20 ENCOUNTER — Encounter: Payer: Self-pay | Admitting: Gastroenterology

## 2013-06-25 ENCOUNTER — Ambulatory Visit
Admission: RE | Admit: 2013-06-25 | Discharge: 2013-06-25 | Disposition: A | Discharge status: Home / Self Care | Payer: Medicare Other | Source: Ambulatory Visit

## 2013-06-25 DIAGNOSIS — Z1231 Encounter for screening mammogram for malignant neoplasm of breast: Secondary | ICD-10-CM

## 2013-06-26 ENCOUNTER — Telehealth: Payer: Self-pay | Admitting: Cardiology

## 2013-06-26 ENCOUNTER — Other Ambulatory Visit: Payer: Self-pay | Admitting: Family Medicine

## 2013-06-26 DIAGNOSIS — R928 Other abnormal and inconclusive findings on diagnostic imaging of breast: Secondary | ICD-10-CM

## 2013-06-26 NOTE — Telephone Encounter (Signed)
New Prob  Pt would like to speak with you regarding an upcoming appt.

## 2013-06-26 NOTE — Telephone Encounter (Signed)
Follow Up ° ° ° ° °Pt calling returning call from earlier. Please call back. °

## 2013-06-26 NOTE — Telephone Encounter (Signed)
Patient will keep ov next week

## 2013-06-26 NOTE — Telephone Encounter (Signed)
Patient called regarding post cath ov being with NP and reassured her that this was ok with  Dr. Patty Sermons. Patient wanted for me to check with him to be sure. Will forward to him for review

## 2013-06-26 NOTE — Telephone Encounter (Signed)
Left message to call back  

## 2013-06-26 NOTE — Telephone Encounter (Signed)
Yes okay to see NP or PA for post-cath visit.

## 2013-06-28 ENCOUNTER — Encounter: Payer: Self-pay | Admitting: Gastroenterology

## 2013-06-28 ENCOUNTER — Ambulatory Visit (INDEPENDENT_AMBULATORY_CARE_PROVIDER_SITE_OTHER): Payer: Medicare Other | Admitting: Gastroenterology

## 2013-06-28 VITALS — BP 130/78 | HR 72 | Ht 61.0 in | Wt 166.0 lb

## 2013-06-28 DIAGNOSIS — Z860100 Personal history of colon polyps, unspecified: Secondary | ICD-10-CM

## 2013-06-28 DIAGNOSIS — R079 Chest pain, unspecified: Secondary | ICD-10-CM

## 2013-06-28 DIAGNOSIS — K219 Gastro-esophageal reflux disease without esophagitis: Secondary | ICD-10-CM

## 2013-06-28 DIAGNOSIS — K5730 Diverticulosis of large intestine without perforation or abscess without bleeding: Secondary | ICD-10-CM

## 2013-06-28 MED ORDER — OMEPRAZOLE 40 MG PO CPDR: 40.0000 mg | DELAYED_RELEASE_CAPSULE | Freq: Every day | ORAL | Status: DC

## 2013-06-28 NOTE — Progress Notes (Signed)
History of Present Illness: This is a 67 year old female with a history of GERD who complains of worsening gas, belching, bloating, nausea and mid sternal chest pain. Her chest pain became more severe with occasional radiation to the back and she underwent cardiac evaluation including catheterization showed patent grafts and no significant disease. Recently her symptoms have improved and the chest pain has abated. She's been maintained on omeprazole 20 mg daily for years. She previously underwent endoscopy in 2002 that was normal.  Partialis She relates an episode of diverticulitis last year that resolved with a course of oral antibiotics. Denies weight loss, abdominal pain, constipation, diarrhea, change in stool caliber, melena, hematochezia, nausea, vomiting, dysphagia.  Review of Systems: Pertinent positive and negative review of systems were noted in the above HPI section. All other review of systems were otherwise negative.  Current Medications, Allergies, Past Medical History, Past Surgical History, Family History and Social History were reviewed in [employer] record.  Physical Exam: General: Well developed , well nourished, no acute distress Head: Normocephalic and atraumatic Eyes:  sclerae anicteric, EOMI Ears: Normal auditory acuity Mouth: No deformity or lesions Neck: Supple, no masses or thyromegaly Lungs: Clear throughout to auscultation, no chest wall tenderness Heart: Regular rate and rhythm; no murmurs, rubs or bruits Abdomen: Soft, non tender and non distended. No masses, hepatosplenomegaly or hernias noted. Normal Bowel sounds Musculoskeletal: Symmetrical with no gross deformities  Skin: No lesions on visible extremities Pulses:  Normal pulses noted Extremities: No clubbing, cyanosis, edema or deformities noted Neurological: Alert oriented x 4, grossly nonfocal Cervical Nodes:  No significant cervical adenopathy Inguinal Nodes: No significant inguinal  adenopathy Psychological:  Alert and cooperative. Normal mood and affect  Assessment and Recommendations:  1. Chest pain, belching, gas, nausea. Rule out active reflux, ulcer disease, esophagitis and upper GI tract neoplasms. Increase omeprazole to 40 mg daily and intensify antireflux measures. Schedule upper endoscopy. The risks, benefits, and alternatives to endoscopy with possible biopsy and possible dilation were discussed with the patient and they consent to proceed.   2. Personal history of adenomatous colon polyps. Five-year surveillance colonoscopy due October 2015.   3. Diverticulosis. Long-term high fiber diet with adequate water intake.

## 2013-06-28 NOTE — Patient Instructions (Addendum)
Increase your omeprazole 20 mg tablets to 2 tablets once daily to equal 40 mg until prescription is finished. Then pick up the new prescription from your pharmacy for omeprazole 40mg  tablets to take one tablet by mouth once daily.  Patient advised to avoid spicy, acidic, citrus, chocolate, mints, fruit and fruit juices.  Limit the intake of caffeine, alcohol and Soda.  Don't exercise too soon after eating.  Don't lie down within 3-4 hours of eating.  Elevate the head of your bed.  You have been scheduled for an endoscopy with propofol. Please follow written instructions given to you at your visit today. If you use inhalers (even only as needed), please bring them with you on the day of your procedure. Your physician has requested that you go to www.startemmi.com and enter the access code given to you at your visit today. This web site gives a general overview about your procedure. However, you should still follow specific instructions given to you by our office regarding your preparation for the procedure.  Thank you for choosing me and [facility].  Venita Lick. Pleas Koch., MD., Clement[facility] Graham  cc: Asencion Partridge, MD

## 2013-07-02 ENCOUNTER — Encounter: Payer: Medicare Other | Admitting: Nurse Practitioner

## 2013-07-11 ENCOUNTER — Other Ambulatory Visit: Payer: Self-pay

## 2013-07-11 ENCOUNTER — Ambulatory Visit (INDEPENDENT_AMBULATORY_CARE_PROVIDER_SITE_OTHER): Payer: Medicare Other | Admitting: Nurse Practitioner

## 2013-07-11 ENCOUNTER — Other Ambulatory Visit: Payer: Self-pay | Admitting: *Deleted

## 2013-07-11 ENCOUNTER — Encounter: Payer: Self-pay | Admitting: Nurse Practitioner

## 2013-07-11 VITALS — BP 130/72 | HR 72 | Ht 61.0 in | Wt 165.8 lb

## 2013-07-11 DIAGNOSIS — Z9889 Other specified postprocedural states: Secondary | ICD-10-CM

## 2013-07-11 DIAGNOSIS — E785 Hyperlipidemia, unspecified: Secondary | ICD-10-CM

## 2013-07-11 DIAGNOSIS — E876 Hypokalemia: Secondary | ICD-10-CM

## 2013-07-11 DIAGNOSIS — I25810 Atherosclerosis of coronary artery bypass graft(s) without angina pectoris: Secondary | ICD-10-CM

## 2013-07-11 DIAGNOSIS — I259 Chronic ischemic heart disease, unspecified: Secondary | ICD-10-CM

## 2013-07-11 NOTE — Patient Instructions (Addendum)
Stay on your current medicines  Stay active  Let us know how your endoscopy turns out  See Dr. Patty Sermons as planned in October  Call the [facility] office at [phone] if you have any questions, problems or concerns.

## 2013-07-11 NOTE — Progress Notes (Signed)
Megan [name] Date of Birth: 11-Aug-1946 Medical Record #[id number]  History of Present Illness: Megan [name] is seen back today for a post hospital visit. Seen for Dr. Patty Sermons. Has CAD, HLD, AAA repair, Renal artery stenosis and PVD with claudication.   Most recently admitted with unstable angina and was cath. Had had a negative Myoview this past June. She was found to have stable anatomy with patent grafts to the RCA and an atretic LIMA to LAD. She will continue with medical management.   Comes back today. Here alone. Doing ok. Feels good. Planning on endoscopy with Dr. Russella Dar in September. Still with an occasional "ache" in the lower sternal region - no exertional symptoms but still somewhat similar to what she has had heart wise. Little cough as well. BP is good. Not exercising as much due to back issues. No problems with her groin.   Current Outpatient Prescriptions  Medication Sig Dispense Refill  . ALPRAZolam (XANAX) 0.25 MG tablet Take 0.25 mg by mouth at bedtime as needed for sleep.      [name] amLODipine (NORVASC) 5 MG tablet Take 1 tablet (5 mg total) by mouth daily.  30 tablet  11  . aspirin 325 MG tablet Take 325 mg by mouth daily.        [name] atorvastatin (LIPITOR) 40 MG tablet Take 1 tablet (40 mg total) by mouth daily.  30 tablet  11  . Cholecalciferol (VITAMIN D3) 5000 UNITS TABS Take 1 tablet by mouth once a week. Take on Friday      . ciclopirox (LOPROX) 0.77 % cream Apply topically 2 (two) times daily.       . clindamycin (CLINDAGEL) 1 % gel Apply 1 application topically 2 (two) times daily as needed (for yeast infection).       . Coenzyme Q10 (CO Q 10) 100 MG CAPS Take 1 capsule by mouth daily.      [name] losartan-hydrochlorothiazide (HYZAAR) 100-12.5 MG per tablet Take 1 tablet by mouth daily.       . metroNIDAZOLE (METROGEL) 1 % gel Apply 1 application topically as needed.       . nitroGLYCERIN (NITROSTAT) 0.4 MG SL tablet Place 0.4 mg under the tongue every 5 (five) minutes as  needed for chest pain.      [name] omeprazole (PRILOSEC) 40 MG capsule Take 1 capsule (40 mg total) by mouth daily.  30 capsule  11  . potassium chloride (K-DUR,KLOR-CON) 10 MEQ tablet Take 1 tablet (10 mEq total) by mouth daily.  30 tablet  6  . PRENATAL VITAMINS PO Take 1 tablet by mouth daily.        . RESTASIS 0.05 % ophthalmic emulsion Place 1 drop into both eyes every 12 (twelve) hours.       . VOLTAREN 1 % GEL Apply 2 g topically daily as needed (for joint pain).        No current facility-administered medications for this visit.    Allergies  Allergen Reactions  . Bextra (Valdecoxib)     Increased lft's  . Codeine     REACTION: rash, itching  . Lisinopril Cough  . Metoprolol     Hair loss  . Mevacor (Lovastatin)     "arthritis"  . Morphine And Related Nausea Only  . Plavix (Clopidogrel Bisulfate)     Itching of hands and feet  . Zocor (Simvastatin)     Bones hurt   . Hydrocod Polst-Cpm Polst Er Rash    Past Medical History  Diagnosis Date  . Coronary artery disease     a. s/p CABG in 1995;  b. 05/2013 Neg MV, EF 82%;  c. 06/2013 Cath: LM nl, LAD 40-50p, LCX nl, RCA 80p/164m, VG->RCA->PDA->PLSA min irregs, LIMA->LAD atretic, vigorous LV fxn.  . Renal artery stenosis     a. 2008 s/p PTA  . Hypertension   . Hyperlipidemia   . Osteoarthritis   . Fibromyalgia   . Eczema, dyshidrotic   . GERD (gastroesophageal reflux disease)   . AAA (abdominal aortic aneurysm)     a. s/p stent grafting in 2009.  . Diverticulosis   . Colon polyp     Past Surgical History  Procedure Laterality Date  . Renal artery stent  2008  . 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 Carpal tunnel release  08/1993    left wrist  . Tonsilectomy, adenoidectomy, bilateral myringotomy and tubes  1989  . Total abdominal hysterectomy  1992  . Carpal tunnel release  01/1997    right  . Thyroid cyst apiration  05/1997 and 07/1998  . Cardiovascular stress test  11/11/2009     normal study  . Cardiac catheterization    . Abdominal aortic aneurysm repair  2009    stenting    History  Smoking status  . Former Smoker  . Quit date: 12/07/1995  Smokeless tobacco  . Never Used    History  Alcohol Use  . 1.2 oz/week  . 2 Glasses of wine per week    Comment: 8 drinks per wwek    Family History  Problem Relation Age of Onset  . Heart disease Mother   . Heart disease Father   . Heart disease Sister   . Diabetes Sister   . Heart disease Brother   . Diabetes Brother     Review of Systems: The review of systems is per the HPI.  All other systems were reviewed and are negative.  Physical Exam: BP 130/72  Pulse 72  Ht 5\' 1"  (1.549 m)  Wt 165 lb 12.8 oz (75.206 kg)  BMI 31.34 kg/m2 Patient is very pleasant and in no acute distress. Skin is warm and dry. Color is normal.  HEENT is unremarkable. Normocephalic/atraumatic. PERRL. Sclera are nonicteric. Neck is supple. No masses. No JVD. Lungs are clear. Cardiac exam shows a regular rate and rhythm. Abdomen is soft. Extremities are without edema. Gait and ROM are intact. No gross neurologic deficits noted.  LABORATORY DATA: Lab Results  Component Value Date   WBC 7.4 06/11/2013   HGB 13.8 06/11/2013   HCT 41.2 06/11/2013   PLT 150 06/11/2013   GLUCOSE 116* 06/09/2013   CHOL 179 05/17/2013   TRIG 134.0 05/17/2013   HDL 50.90 05/17/2013   LDLDIRECT 124.1 03/20/2012   LDLCALC 101* 05/17/2013   ALT 76* 06/09/2013   AST 68* 06/09/2013   NA 142 06/09/2013   K 4.7 06/11/2013   CL 101 06/09/2013   CREATININE 0.90 06/09/2013   BUN 9 06/09/2013   CO2 32 06/09/2013   INR 1.01 06/09/2013   Angiography  Left Main: moderate sized, smooth and normal  Left anterior Descending: proximal 40-50% stenosis, Minor irregularities. Competitive filling of the LIMA can be seen. diags are normal  Left Circumflex: moderate in size, normal  Right Coronary Artery: 80 % proximal stenosis, followed by mid occlusion ( chronic occlusion)  SVG to RCA: Minor  irregularities, Anastomosis to PDA and PLSA are normal.  LIMA  to LAD: Small, atretic. The is competitive filling of the distal LAD   LV Gram: vigorous LV function. EF 70%.   Complications: No apparent complications  Patient did tolerate procedure well.   Conclusions: Home today.  Vesta Mixer, Montez Hageman., MD, [facility]  06/11/2013, 10:29 AM    Assessment / Plan: 1. CAD - s/p recent cath for unstable angina - this showed stable anatomy - will continue with medical management. Still with some occasional symptoms. She is having an endo next month. She will let us know how that turns out. If ok, may need to consider trial of nitrates.   2. HTN - BP is ok. No change in her current regimen.   3. HLD - on statin therapy  4. PVD - followed by Dr. Arbie Cookey   Patient is agreeable to this plan and will call if any problems develop in the interim.   Rosalio Macadamia, RN, ANP-C [facility] [address] [place], [place]  [address]

## 2013-07-12 ENCOUNTER — Ambulatory Visit
Admission: RE | Admit: 2013-07-12 | Discharge: 2013-07-12 | Disposition: A | Discharge status: Home / Self Care | Payer: Medicare Other | Source: Ambulatory Visit | Attending: Family Medicine | Admitting: Family Medicine

## 2013-07-12 DIAGNOSIS — R928 Other abnormal and inconclusive findings on diagnostic imaging of breast: Secondary | ICD-10-CM

## 2013-07-24 ENCOUNTER — Encounter: Payer: Self-pay | Admitting: Gastroenterology

## 2013-07-24 ENCOUNTER — Ambulatory Visit (AMBULATORY_SURGERY_CENTER): Payer: Medicare Other | Admitting: Gastroenterology

## 2013-07-24 ENCOUNTER — Other Ambulatory Visit: Payer: Self-pay | Admitting: Cardiology

## 2013-07-24 VITALS — BP 148/68 | HR 71 | Temp 97.5°F | Resp 21 | Ht 61.0 in | Wt 166.0 lb

## 2013-07-24 DIAGNOSIS — D1330 Benign neoplasm of unspecified part of small intestine: Secondary | ICD-10-CM

## 2013-07-24 DIAGNOSIS — R079 Chest pain, unspecified: Secondary | ICD-10-CM

## 2013-07-24 DIAGNOSIS — K219 Gastro-esophageal reflux disease without esophagitis: Secondary | ICD-10-CM

## 2013-07-24 DIAGNOSIS — F419 Anxiety disorder, unspecified: Secondary | ICD-10-CM

## 2013-07-24 DIAGNOSIS — K2970 Gastritis, unspecified, without bleeding: Secondary | ICD-10-CM

## 2013-07-24 DIAGNOSIS — D131 Benign neoplasm of stomach: Secondary | ICD-10-CM

## 2013-07-24 MED ORDER — SODIUM CHLORIDE 0.9 % IV SOLN: 500.0000 mL | INTRAVENOUS | Status: DC

## 2013-07-24 NOTE — Progress Notes (Signed)
Patient did not experience any of the following events: a burn prior to discharge; a fall within the facility; wrong site/side/patient/procedure/implant event; or a hospital transfer or hospital admission upon discharge from the facility. (G8907) Patient did not have preoperative order for IV antibiotic SSI prophylaxis. (G8918)  

## 2013-07-24 NOTE — Op Note (Signed)
[facility] [address] [place], [address]   ENDOSCOPY PROCEDURE REPORT  PATIENT: Megan, [name]  MR#: [id number] BIRTHDATE: [birth year] , 66  yrs. old GENDER: Female ENDOSCOPIST: Meryl Dare, MD, [facility] PROCEDURE DATE:  07/24/2013 PROCEDURE:  EGD w/ biopsy ASA CLASS:     Class II INDICATIONS:  Chest pain.   History of esophageal reflux. MEDICATIONS: MAC sedation, administered by CRNA and propofol (Diprivan) 150mg  IV TOPICAL ANESTHETIC: Cetacaine Spray DESCRIPTION OF PROCEDURE: After the risks benefits and alternatives of the procedure were thoroughly explained, informed consent was obtained.  The LB [id number] endoscope was introduced through the mouth and advanced to the second portion of the duodenum  without limitations.  The instrument was slowly withdrawn as the mucosa was fully examined.  STOMACH: Mild gastritis (inflammation) was found in the gastric body and gastric antrum. It was erythematous and granular.  Multiple biopsies were performed.   The stomach otherwise appeared normal.  DUODENUM: A medium sized nodule, measuring 6 mm, was found in the duodenal bulb. Multiple biopsies were performed. The duodenal mucosa showed no abnormalities in the 2nd part of the duodenum. ESOPHAGUS: The mucosa of the esophagus appeared normal.  Retroflexed views revealed a small hiatal hernia.  The scope was then withdrawn from the patient and the procedure completed.  COMPLICATIONS: There were no complications.  ENDOSCOPIC IMPRESSION: 1.   Gastritis in the gastric body and gastric antrum; multiple biopsies 2.   Small hiatal hernia 3.    Duodenal bulb nodule; multiple biopsies  RECOMMENDATIONS: 1.  Anti-reflux regimen 2.  Await pathology results 3.  Continue PPI   eSigned:  Meryl Dare, MD, [facility] 07/24/2013 2:56 PM   CC: Asencion Partridge, MD

## 2013-07-24 NOTE — Patient Instructions (Addendum)
YOU HAD AN ENDOSCOPIC PROCEDURE TODAY AT THE [facility]: Refer to the procedure report that was given to you for any specific questions about what was found during the examination.  If the procedure report does not answer your questions, please call your gastroenterologist to clarify.  If you requested that your care partner not be given the details of your procedure findings, then the procedure report has been included in a sealed envelope for you to review at your convenience later.  YOU SHOULD EXPECT: Some feelings of bloating in the abdomen. Passage of more gas than usual.  Walking can help get rid of the air that was put into your GI tract during the procedure and reduce the bloating. If you had a lower endoscopy (such as a colonoscopy or flexible sigmoidoscopy) you may notice spotting of blood in your stool or on the toilet paper. If you underwent a bowel prep for your procedure, then you may not have a normal bowel movement for a few days.  DIET: Your first meal following the procedure should be a light meal and then it is ok to progress to your normal diet.  A half-sandwich or bowl of soup is an example of a good first meal.  Heavy or fried foods are harder to digest and may make you feel nauseous or bloated.  Likewise meals heavy in dairy and vegetables can cause extra gas to form and this can also increase the bloating.  Drink plenty of fluids but you should avoid alcoholic beverages for 24 hours.  ACTIVITY: Your care partner should take you home directly after the procedure.  You should plan to take it easy, moving slowly for the rest of the day.  You can resume normal activity the day after the procedure however you should NOT DRIVE or use heavy machinery for 24 hours (because of the sedation medicines used during the test).    SYMPTOMS TO REPORT IMMEDIATELY: A gastroenterologist can be reached at any hour.  During normal business hours, 8:30 AM to 5:00 PM Monday through Friday,  call [phone].  After hours and on weekends, please call the GI answering service at [phone] who will take a message and have the physician on call contact you.    Following upper endoscopy (EGD)  Vomiting of blood or coffee ground material  New chest pain or pain under the shoulder blades  Painful or persistently difficult swallowing  New shortness of breath  Fever of 100F or higher  Black, tarry-looking stools  FOLLOW UP: If any biopsies were taken you will be contacted by phone or by letter within the next 1-3 weeks.  Call your gastroenterologist if you have not heard about the biopsies in 3 weeks.  Our staff will call the home number listed on your records the next business day following your procedure to check on you and address any questions or concerns that you may have at that time regarding the information given to you following your procedure. This is a courtesy call and so if there is no answer at the home number and we have not heard from you through the emergency physician on call, we will assume that you have returned to your regular daily activities without incident.   Gastritis , hiatal hernia, anti reflux regimen, Continue PPI.  Await biopsy results.  SIGNATURES/CONFIDENTIALITY: You and/or your care partner have signed paperwork which will be entered into your electronic medical record.  These signatures attest to the fact that that the  information above on your After Visit Summary has been reviewed and is understood.  Full responsibility of the confidentiality of this discharge information lies with you and/or your care-partner.

## 2013-07-24 NOTE — Progress Notes (Signed)
Called to room to assist during endoscopic procedure.  Patient ID and intended procedure confirmed with present staff. Received instructions for my participation in the procedure from the performing physician.  

## 2013-07-25 ENCOUNTER — Telehealth: Payer: Self-pay | Admitting: *Deleted

## 2013-07-25 NOTE — Telephone Encounter (Signed)
  Follow up Call-  Call back number 07/24/2013  Post procedure Call Back phone  # [phone]  Permission to leave phone message Yes     Patient questions:  Do you have a fever, pain , or abdominal swelling? no Pain Score  0 *  Have you tolerated food without any problems? yes  Have you been able to return to your normal activities? yes  Do you have any questions about your discharge instructions: Diet   no Medications  no Follow up visit  no  Do you have questions or concerns about your Care? no  Actions: * If pain score is 4 or above: No action needed, pain <4.  Pt. Stated she was impressed.

## 2013-07-31 ENCOUNTER — Encounter: Payer: Self-pay | Admitting: Gastroenterology

## 2013-07-31 ENCOUNTER — Other Ambulatory Visit: Payer: Medicare Other

## 2013-08-01 ENCOUNTER — Telehealth: Payer: Self-pay

## 2013-08-01 NOTE — Telephone Encounter (Signed)
Patient notified, she needs to call me back to schedule in a few minutes.

## 2013-08-01 NOTE — Telephone Encounter (Signed)
Message copied by Annett Fabian on Wed Aug 01, 2013 11:07 AM ------      Message from: Claudette Head T      Created: Tue Jul 31, 2013  4:40 PM       Duoden nodule proved to be an adenoma on path report which needs to be removed. Please cchedule EGD with polypectomy at [facility]. ------

## 2013-08-01 NOTE — Telephone Encounter (Signed)
Patient is scheduled for pre-visit 08/03/13 10:30 and EGD 08/07/13 9:30

## 2013-08-03 ENCOUNTER — Ambulatory Visit (AMBULATORY_SURGERY_CENTER): Payer: Medicare Other

## 2013-08-03 VITALS — Ht 61.0 in | Wt 166.4 lb

## 2013-08-03 DIAGNOSIS — R079 Chest pain, unspecified: Secondary | ICD-10-CM

## 2013-08-07 ENCOUNTER — Ambulatory Visit (AMBULATORY_SURGERY_CENTER): Payer: Medicare Other | Admitting: Gastroenterology

## 2013-08-07 ENCOUNTER — Encounter: Payer: Self-pay | Admitting: Gastroenterology

## 2013-08-07 VITALS — BP 127/62 | HR 64 | Temp 97.8°F | Resp 22 | Ht 61.0 in | Wt 166.0 lb

## 2013-08-07 DIAGNOSIS — D1330 Benign neoplasm of unspecified part of small intestine: Secondary | ICD-10-CM

## 2013-08-07 DIAGNOSIS — D126 Benign neoplasm of colon, unspecified: Secondary | ICD-10-CM

## 2013-08-07 DIAGNOSIS — R079 Chest pain, unspecified: Secondary | ICD-10-CM

## 2013-08-07 MED ORDER — SODIUM CHLORIDE 0.9 % IV SOLN: 500.0000 mL | INTRAVENOUS | Status: DC

## 2013-08-07 NOTE — Op Note (Signed)
[facility] [address] [place], [address]   ENDOSCOPY PROCEDURE REPORT  PATIENT: Megan [name], Megan [name]  MR#: [id number] BIRTHDATE: 1946/03/04 , 66  yrs. old GENDER: Female ENDOSCOPIST: Meryl Dare, MD, [facility] PROCEDURE DATE:  08/07/2013 PROCEDURE:  EGD w/ snare technique ASA CLASS:     Class II INDICATIONS:  duodenal adenomatous polyp. MEDICATIONS: MAC sedation, administered by CRNA and propofol (Diprivan) 180mg  IV TOPICAL ANESTHETIC: none DESCRIPTION OF PROCEDURE: After the risks benefits and alternatives of the procedure were thoroughly explained, informed consent was obtained.  The LB [id number] endoscope was introduced through the mouth and advanced to the second portion of the duodenum  without limitations.  The instrument was slowly withdrawn as the mucosa was fully examined.  DUODENUM: A sessile polyp measuring 6 mm in size was found in the duodenal bulb. A polypectomy was performed with snare cautery. The resection was complete and the polyp tissue was completely retrieved. The duodenal mucosa showed no abnormalities in the 2nd part of the duodenum. STOMACH: Gastritis in the gastric body and gastric antrum.  The stomach otherwise appeared normal. ESOPHAGUS: The mucosa of the esophagus appeared normal.  Retroflexed views revealed a small hiatal hernia. The scope was then withdrawn from the patient and the procedure completed.  COMPLICATIONS: There were no complications.  ENDOSCOPIC IMPRESSION: 1.   Sessile polyp measuring 6 mm in the duodenal bulb 2.   Gastritis in the gastric body and gastric antrum  RECOMMENDATIONS: 1.  Anti-reflux regimen 2.  Continue PPI 3.  Avoid ASA/NSAIDS for two weeks 4.  Await pathology results 5.  Endoscopy in 1 year  eSigned:  Meryl Dare, MD, [facility] 08/07/2013 9:27 AM   FA:[id number] Mardelle Matte, MD

## 2013-08-07 NOTE — Progress Notes (Addendum)
Patient did not have preoperative order for IV antibiotic SSI prophylaxis. (G8918)  Patient did not experience any of the following events: a burn prior to discharge; a fall within the facility; wrong site/side/patient/procedure/implant event; or a hospital transfer or hospital admission upon discharge from the facility. (G8907)  

## 2013-08-07 NOTE — Progress Notes (Signed)
Called to room to assist during endoscopic procedure.  Patient ID and intended procedure confirmed with present staff. Received instructions for my participation in the procedure from the performing physician.  

## 2013-08-07 NOTE — Progress Notes (Signed)
Procedure ends, to recovery, report given to Hodges, RN and VSS. 

## 2013-08-07 NOTE — Patient Instructions (Addendum)

## 2013-08-08 ENCOUNTER — Telehealth: Payer: Self-pay | Admitting: *Deleted

## 2013-08-08 NOTE — Telephone Encounter (Signed)
  Follow up Call-  Call back number 08/07/2013 07/24/2013  Post procedure Call Back phone  # [phone]  Permission to leave phone message Yes Yes     Patient questions:  Do you have a fever, pain , or abdominal swelling? no Pain Score  0 *  Have you tolerated food without any problems? yes  Have you been able to return to your normal activities? yes  Do you have any questions about your discharge instructions: Diet   no Medications  no Follow up visit  no  Do you have questions or concerns about your Care? no  Actions: * If pain score is 4 or above: No action needed, pain <4.  I am doing fine"

## 2013-08-14 ENCOUNTER — Encounter: Payer: Self-pay | Admitting: Gastroenterology

## 2013-08-21 ENCOUNTER — Other Ambulatory Visit: Payer: Medicare Other

## 2013-08-21 ENCOUNTER — Ambulatory Visit: Payer: Medicare Other | Admitting: Vascular Surgery

## 2013-08-29 ENCOUNTER — Other Ambulatory Visit: Payer: Self-pay | Admitting: Vascular Surgery

## 2013-08-29 LAB — BUN: BUN: 13 mg/dL (ref 6–23)

## 2013-08-29 LAB — CREATININE, SERUM: Creat: 0.72 mg/dL (ref 0.50–1.10)

## 2013-09-03 ENCOUNTER — Encounter: Payer: Self-pay | Admitting: Vascular Surgery

## 2013-09-04 ENCOUNTER — Ambulatory Visit (HOSPITAL_COMMUNITY)
Admission: RE | Admit: 2013-09-04 | Discharge: 2013-09-04 | Disposition: A | Discharge status: Home / Self Care | Payer: Medicare Other | Source: Ambulatory Visit | Attending: Neurosurgery | Admitting: Neurosurgery

## 2013-09-04 ENCOUNTER — Ambulatory Visit (INDEPENDENT_AMBULATORY_CARE_PROVIDER_SITE_OTHER): Payer: Medicare Other | Admitting: Vascular Surgery

## 2013-09-04 ENCOUNTER — Ambulatory Visit: Payer: Medicare Other | Admitting: Vascular Surgery

## 2013-09-04 ENCOUNTER — Ambulatory Visit
Admission: RE | Admit: 2013-09-04 | Discharge: 2013-09-04 | Disposition: A | Discharge status: Home / Self Care | Payer: Medicare Other | Source: Ambulatory Visit | Attending: Neurosurgery | Admitting: Neurosurgery

## 2013-09-04 ENCOUNTER — Encounter: Payer: Self-pay | Admitting: Vascular Surgery

## 2013-09-04 ENCOUNTER — Other Ambulatory Visit: Payer: Medicare Other

## 2013-09-04 VITALS — BP 148/80 | HR 70 | Resp 16 | Ht 61.5 in | Wt 163.0 lb

## 2013-09-04 DIAGNOSIS — I714 Abdominal aortic aneurysm, without rupture, unspecified: Secondary | ICD-10-CM

## 2013-09-04 DIAGNOSIS — Z48812 Encounter for surgical aftercare following surgery on the circulatory system: Secondary | ICD-10-CM

## 2013-09-04 DIAGNOSIS — I701 Atherosclerosis of renal artery: Secondary | ICD-10-CM

## 2013-09-04 MED ORDER — IOHEXOL 350 MG/ML SOLN
100.0000 mL | Freq: Once | INTRAVENOUS | Status: AC | PRN
  Administered 2013-09-04: 100 mL via INTRAVENOUS

## 2013-09-04 NOTE — Progress Notes (Signed)
The patient is a 14 followup of her diffuse refresh occlusive disease. She is status post stent graft repair, aortic aneurysm 2009. She also underwent right renal artery stenting in 2008. Her prior procedures have been with Dr. Madilyn Fireman. She looks quite good today. She reports no recent difficulty. She reports she is stable from a cardiac standpoint. She underwent a CT angiogram and also duplex for renal artery today.  She does report she has been having some mild left calf claudication apparently had outpatient duplex yes and some limited flow in her left leg.  Past Medical History  Diagnosis Date  . Coronary artery disease     a. s/p CABG in 1995;  b. 05/2013 Neg MV, EF 82%;  c. 06/2013 Cath: LM nl, LAD 40-50p, LCX nl, RCA 80p/118m, VG->RCA->PDA->PLSA min irregs, LIMA->LAD atretic, vigorous LV fxn.  . Renal artery stenosis     a. 2008 s/p PTA  . Hypertension   . Hyperlipidemia   . Osteoarthritis   . Fibromyalgia   . Eczema, dyshidrotic   . GERD (gastroesophageal reflux disease)   . AAA (abdominal aortic aneurysm)     a. s/p stent grafting in 2009.  . Diverticulosis   . Colon polyp   . Peripheral arterial disease     left leg diagnosed by Dr Patty Sermons    History  Substance Use Topics  . Smoking status: Former Smoker    Types: Cigarettes    Quit date: 12/07/1995  . Smokeless tobacco: Never Used  . Alcohol Use: 1.2 oz/week    2 Glasses of wine per week     Comment: 8 drinks per wwek    Family History  Problem Relation Age of Onset  . Heart disease Mother   . Heart disease Father   . Heart disease Sister   . Diabetes Sister   . Heart disease Brother   . Diabetes Brother   . Colon cancer Paternal Grandmother   . Pancreatic cancer Neg Hx   . Rectal cancer Neg Hx   . Stomach cancer Neg Hx   . Esophageal cancer Neg Hx     Allergies  Allergen Reactions  . Bextra [Valdecoxib]     Increased lft's  . Codeine     REACTION: rash, itching  . Lisinopril Cough  . Metoprolol    Hair loss  . Mevacor [Lovastatin]     "arthritis"  . Morphine And Related Nausea Only  . Plavix [Clopidogrel Bisulfate]     Itching of hands and feet  . Zocor [Simvastatin]     Bones hurt   . Hydrocod Polst-Cpm Polst Er Rash    Current outpatient prescriptions:ALPRAZolam (XANAX) 0.25 MG tablet, TAKE 1 TABLET BY MOUTH EVERY DAY AS NEEDED, Disp: 90 tablet, Rfl: 1;  amLODipine (NORVASC) 5 MG tablet, Take 1 tablet (5 mg total) by mouth daily., Disp: 30 tablet, Rfl: 11;  aspirin 325 MG tablet, Take 325 mg by mouth daily.  , Disp: , Rfl: ;  atorvastatin (LIPITOR) 40 MG tablet, Take 1 tablet (40 mg total) by mouth daily., Disp: 30 tablet, Rfl: 11 Cholecalciferol (VITAMIN D3) 5000 UNITS TABS, Take 1 tablet by mouth once a week. Take on Friday, Disp: , Rfl: ;  ciclopirox (LOPROX) 0.77 % cream, Apply topically 2 (two) times daily. , Disp: , Rfl: ;  clindamycin (CLINDAGEL) 1 % gel, Apply 1 application topically 2 (two) times daily as needed (for yeast infection). , Disp: , Rfl: ;  Coenzyme Q10 (CO Q 10) 100 MG CAPS, Take  1 capsule by mouth daily., Disp: , Rfl:  losartan-hydrochlorothiazide (HYZAAR) 100-12.5 MG per tablet, Take 1 tablet by mouth daily. , Disp: , Rfl: ;  metroNIDAZOLE (METROGEL) 1 % gel, Apply 1 application topically as needed. , Disp: , Rfl: ;  nitroGLYCERIN (NITROSTAT) 0.4 MG SL tablet, Place 0.4 mg under the tongue every 5 (five) minutes as needed for chest pain., Disp: , Rfl: ;  omeprazole (PRILOSEC) 40 MG capsule, Take 1 capsule (40 mg total) by mouth daily., Disp: 30 capsule, Rfl: 11 potassium chloride (K-DUR,KLOR-CON) 10 MEQ tablet, Take 1 tablet (10 mEq total) by mouth daily., Disp: 30 tablet, Rfl: 6;  PRENATAL VITAMINS PO, Take 1 tablet by mouth daily.  , Disp: , Rfl: ;  Probiotic Product (PROBIOTIC DAILY PO), Take by mouth., Disp: , Rfl: ;  RESTASIS 0.05 % ophthalmic emulsion, Place 1 drop into both eyes every 12 (twelve) hours. , Disp: , Rfl:  VOLTAREN 1 % GEL, Apply 2 g topically  daily as needed (for joint pain). , Disp: , Rfl:   BP 148/80  Pulse 70  Resp 16  Ht 5' 1.5" (1.562 m)  Wt 163 lb (73.936 kg)  BMI 30.3 kg/m2  Body mass index is 30.3 kg/(m^2).       Physical exam well-developed well-nourished female appearing younger than stated age Carotid arteries without bruits bilaterally Heart regular rate and rhythm without murmur Chest clear with equal breath sounds bilaterally Abdomen soft nontender with no palpable aneurysm and no tenderness Pulse status 2+ radial 2+ femoral and 2+ dorsalis pedis pulses bilaterally Neurologically she is grossly intact  CT scan today shows excellent positioning of her stent graft with no evidence of endoleak and continued diminished size down to 3.9 cm of her aneurysm sac. The right renal artery is patent  Duplex today shows no evidence of stenosis in her a renal artery stent  Impression and plan stable followup from a stent graft repair of abdominal aortic aneurysm and right renal artery stenting. I did discuss the issue regarding her lower surety arterial insufficiency. She does have palpable dorsalis pedis pulse but does have some mild claudication symptoms of the left. I recommended continued walking program. We will see her in 2 years with duplex followup of her aorta

## 2013-09-05 NOTE — Addendum Note (Signed)
Addended by: Sharee Pimple on: 09/05/2013 08:21 AM   Modules accepted: Orders

## 2013-09-13 ENCOUNTER — Telehealth: Payer: Self-pay | Admitting: Cardiology

## 2013-09-13 DIAGNOSIS — M791 Myalgia, unspecified site: Secondary | ICD-10-CM

## 2013-09-13 NOTE — Telephone Encounter (Signed)
New Problem  Pt request to have a muscle function evaluation performed during her lab appointment.. Request a call back to discuss.

## 2013-09-13 NOTE — Telephone Encounter (Signed)
Added CK, patient having leg pain. Will add labs as requested and will discuss with  Dr. Patty Sermons at [facility] next week

## 2013-09-14 ENCOUNTER — Other Ambulatory Visit (INDEPENDENT_AMBULATORY_CARE_PROVIDER_SITE_OTHER): Payer: Medicare Other

## 2013-09-14 DIAGNOSIS — E876 Hypokalemia: Secondary | ICD-10-CM

## 2013-09-14 DIAGNOSIS — IMO0001 Reserved for inherently not codable concepts without codable children: Secondary | ICD-10-CM

## 2013-09-14 DIAGNOSIS — I25810 Atherosclerosis of coronary artery bypass graft(s) without angina pectoris: Secondary | ICD-10-CM

## 2013-09-14 DIAGNOSIS — M791 Myalgia, unspecified site: Secondary | ICD-10-CM

## 2013-09-14 DIAGNOSIS — E785 Hyperlipidemia, unspecified: Secondary | ICD-10-CM

## 2013-09-14 LAB — HEPATIC FUNCTION PANEL
ALT: 71 U/L — ABNORMAL HIGH (ref 0–35)
ALT: 73 U/L — ABNORMAL HIGH (ref 0–35)
AST: 68 U/L — ABNORMAL HIGH (ref 0–37)
AST: 69 U/L — ABNORMAL HIGH (ref 0–37)
Albumin: 3.9 g/dL (ref 3.5–5.2)
Albumin: 4 g/dL (ref 3.5–5.2)
Alkaline Phosphatase: 73 U/L (ref 39–117)
Alkaline Phosphatase: 73 U/L (ref 39–117)
Bilirubin, Direct: 0.1 mg/dL (ref 0.0–0.3)
Bilirubin, Direct: 0.1 mg/dL (ref 0.0–0.3)
Total Bilirubin: 1 mg/dL (ref 0.3–1.2)
Total Bilirubin: 1.1 mg/dL (ref 0.3–1.2)
Total Protein: 6.7 g/dL (ref 6.0–8.3)
Total Protein: 6.7 g/dL (ref 6.0–8.3)

## 2013-09-14 LAB — LIPID PANEL
Cholesterol: 177 mg/dL (ref 0–200)
Cholesterol: 180 mg/dL (ref 0–200)
HDL: 58.7 mg/dL (ref >39.00)
HDL: 57.4 mg/dL (ref >39.00)
LDL Cholesterol: 97 mg/dL (ref 0–99)
LDL Cholesterol: 101 mg/dL — ABNORMAL HIGH (ref 0–99)
Total CHOL/HDL Ratio: 3
Total CHOL/HDL Ratio: 3
Triglycerides: 105 mg/dL (ref 0.0–149.0)
Triglycerides: 109 mg/dL (ref 0.0–149.0)
VLDL: 21 mg/dL (ref 0.0–40.0)
VLDL: 21.8 mg/dL (ref 0.0–40.0)

## 2013-09-14 LAB — BASIC METABOLIC PANEL
BUN: 15 mg/dL (ref 6–23)
CO2: 27 mEq/L (ref 19–32)
Calcium: 9.3 mg/dL (ref 8.4–10.5)
Chloride: 101 mEq/L (ref 96–112)
Creatinine, Ser: 0.8 mg/dL (ref 0.4–1.2)
GFR: 72.43 mL/min (ref >60.00)
Glucose, Bld: 128 mg/dL — ABNORMAL HIGH (ref 70–99)
Potassium: 3.5 mEq/L (ref 3.5–5.1)
Sodium: 141 mEq/L (ref 135–145)

## 2013-09-14 LAB — CK: Total CK: 58 U/L (ref 7–177)

## 2013-09-16 NOTE — Progress Notes (Signed)
Quick Note:  Please make copy of labs for patient visit. ______ 

## 2013-09-20 ENCOUNTER — Encounter: Payer: Self-pay | Admitting: Cardiology

## 2013-09-20 ENCOUNTER — Ambulatory Visit (INDEPENDENT_AMBULATORY_CARE_PROVIDER_SITE_OTHER): Payer: Medicare Other | Admitting: Cardiology

## 2013-09-20 VITALS — BP 130/78 | HR 65 | Ht 61.0 in | Wt 164.0 lb

## 2013-09-20 DIAGNOSIS — I119 Hypertensive heart disease without heart failure: Secondary | ICD-10-CM

## 2013-09-20 DIAGNOSIS — I2510 Atherosclerotic heart disease of native coronary artery without angina pectoris: Secondary | ICD-10-CM

## 2013-09-20 DIAGNOSIS — E785 Hyperlipidemia, unspecified: Secondary | ICD-10-CM

## 2013-09-20 DIAGNOSIS — I25810 Atherosclerosis of coronary artery bypass graft(s) without angina pectoris: Secondary | ICD-10-CM

## 2013-09-20 MED ORDER — AMLODIPINE BESYLATE 10 MG PO TABS: 10.0000 mg | ORAL_TABLET | Freq: Every day | ORAL | Status: DC

## 2013-09-20 MED ORDER — LOSARTAN POTASSIUM-HCTZ 100-12.5 MG PO TABS: 1.0000 | ORAL_TABLET | Freq: Every day | ORAL | Status: DC

## 2013-09-20 NOTE — Assessment & Plan Note (Signed)
She is complaining of weakness in her legs.  Her total CK level is normal.  Nonetheless we are going to stop her statin therapy over the next 4 months and see if like symptoms improve.

## 2013-09-20 NOTE — Patient Instructions (Addendum)
INCREASE YOUR POTASSIUM TO 10 MEQ TWICE A DAY  INCREASE YOUR AMLODIPINE TO 10 MG DAILY  STOP LIPITOR   Your physician wants you to follow-up in: 4 months with fasting labs (lp/bmet/hfp)  You will receive a reminder letter in the mail two months in advance. If you don't receive a letter, please call our office to schedule the follow-up appointment.

## 2013-09-20 NOTE — Progress Notes (Signed)
Megan [name] Date of Birth:  06/29/1946 [address] [place], [place]  [address] [phone]         Fax   [phone]  History of Present Illness: This pleasant 67 year old woman is seen for a four-month followup office visit. She has a past history of essential hypertension and a past history of coronary artery disease. Also has a history of dyslipidemia. She is status post coronary artery bypass graft surgery in 1995. She had a renal artery stent for renal artery stenosis in 2008. She had a stent graft of an abdominal aortic aneurysm 2009. She has had chronic mild elevation of liver function enzymes secondary to modest alcohol intake and to fatty liver. In April 2013 she was hospitalized briefly [name] for dizziness and was found to be hypokalemic. She has been weaned off her Demadex and off for her potassium and has had no further episodes of dizziness. Her blood pressure is followed closely by her primary care provider.  Since her last office visit she was hospitalized in July 2014 and had cardiac catheterization which showed that her grafts were open.  There was no evidence of myocardial damage.  She was subsequently sent to her gastroenterologist Dr. Russella Dar who increased her Prilosec from 20 mg to 40 mg daily and her chest discomfort has resolved.  She is now walking at the gym 3 days a week.  She has had some back pain and is seeing a physical therapist also.  She is complaining of worsening leg strength we talked about the possible role of statin therapy.   Current Outpatient Prescriptions  Medication Sig Dispense Refill  . ALPRAZolam (XANAX) 0.25 MG tablet TAKE 1 TABLET BY MOUTH EVERY DAY AS NEEDED  90 tablet  1  . amLODipine (NORVASC) 10 MG tablet Take 1 tablet (10 mg total) by mouth daily.  30 tablet  11  . aspirin 325 MG tablet Take 325 mg by mouth daily.        . Cholecalciferol (VITAMIN D3) 5000 UNITS TABS Take 1 tablet by mouth once a week. Take on Friday       . ciclopirox (LOPROX) 0.77 % cream Apply topically 2 (two) times daily.       . clindamycin (CLINDAGEL) 1 % gel Apply 1 application topically 2 (two) times daily as needed (for yeast infection).       . Coenzyme Q10 (CO Q 10) 100 MG CAPS Take 1 capsule by mouth daily.      [name] losartan-hydrochlorothiazide (HYZAAR) 100-12.5 MG per tablet Take 1 tablet by mouth daily.  30 tablet  11  . metroNIDAZOLE (METROGEL) 1 % gel Apply 1 application topically as needed.       . nitroGLYCERIN (NITROSTAT) 0.4 MG SL tablet Place 0.4 mg under the tongue every 5 (five) minutes as needed for chest pain.      [name] omeprazole (PRILOSEC) 40 MG capsule Take 1 capsule (40 mg total) by mouth daily.  30 capsule  11  . potassium chloride (K-DUR,KLOR-CON) 10 MEQ tablet Take 10 mEq by mouth 2 (two) times daily.      [name] PRENATAL VITAMINS PO Take 1 tablet by mouth daily.        . Probiotic Product (PROBIOTIC DAILY PO) Take by mouth.      . RESTASIS 0.05 % ophthalmic emulsion Place 1 drop into both eyes every 12 (twelve) hours.       . VOLTAREN 1 % GEL Apply 2 g topically daily as  needed (for joint pain).        No current facility-administered medications for this visit.    Allergies  Allergen Reactions  . Bextra [Valdecoxib]     Increased lft's  . Codeine     REACTION: rash, itching  . Lipitor [Atorvastatin]     LEG WEAKNESS  . Lisinopril Cough  . Metoprolol     Hair loss  . Mevacor [Lovastatin]     "arthritis"  . Morphine And Related Nausea Only  . Plavix [Clopidogrel Bisulfate]     Itching of hands and feet  . Zocor [Simvastatin]     Bones hurt   . Hydrocod Polst-Cpm Polst Er Rash    Patient Active Problem List   Diagnosis Date Noted  . Benign hypertensive heart disease without heart failure 05/18/2011    Priority: High  . CAD (coronary artery disease) 05/18/2011    Priority: High  . Hyperlipidemia     Priority: High  . Atherosclerosis of renal artery 09/04/2013  . Coronary artery disease   .  Unstable angina 06/09/2013  . Peripheral vascular disease with claudication   . Abnormal LFTs (liver function tests) 09/19/2012  . Diverticulosis 09/19/2012  . Hypokalemia 03/23/2012  . History of abdominal aortic aneurysm repair 05/18/2011  . History of renal artery stenosis 05/18/2011    History  Smoking status  . Former Smoker  . Types: Cigarettes  . Quit date: 12/07/1995  Smokeless tobacco  . Never Used    History  Alcohol Use  . 1.2 oz/week  . 2 Glasses of wine per week    Comment: 8 drinks per wwek    Family History  Problem Relation Age of Onset  . Heart disease Mother   . Heart disease Father   . Heart disease Sister   . Diabetes Sister   . Heart disease Brother   . Diabetes Brother   . Colon cancer Paternal Grandmother   . Pancreatic cancer Neg Hx   . Rectal cancer Neg Hx   . Stomach cancer Neg Hx   . Esophageal cancer Neg Hx     Review of Systems: Constitutional: no fever chills diaphoresis or fatigue or change in weight.  Head and neck: no hearing loss, no epistaxis, no photophobia or visual disturbance. Respiratory: No cough, shortness of breath or wheezing. Cardiovascular: No chest pain peripheral edema, palpitations. Gastrointestinal: No abdominal distention, no abdominal pain, no change in bowel habits hematochezia or melena. Genitourinary: No dysuria, no frequency, no urgency, no nocturia. Musculoskeletal:No arthralgias, no back pain, no gait disturbance or myalgias. Neurological: No dizziness, no headaches, no numbness, no seizures, no syncope, no weakness, no tremors. Hematologic: No lymphadenopathy, no easy bruising. Psychiatric: No confusion, no hallucinations, no sleep disturbance.    Physical Exam: Filed Vitals:   09/20/13 1008  BP: 130/78  Pulse: 65   the general appearance reveals a well-developed well-nourished woman in no distress.  Her weight is down 5 pounds since last visit.The head and neck exam reveals pupils equal and  reactive.  Extraocular movements are full.  There is no scleral icterus.  The mouth and pharynx are normal.  The neck is supple.  The carotids reveal no bruits.  The jugular venous pressure is normal.  The  thyroid is not enlarged.  There is no lymphadenopathy.  The chest is clear to percussion and auscultation.  There are no rales or rhonchi.  Expansion of the chest is symmetrical.  The precordium is quiet.  The first heart sound is normal.  The second heart sound is physiologically split.  There is no murmur gallop rub or click.  There is no abnormal lift or heave.  The abdomen is soft and nontender.  The bowel sounds are normal.  The liver and spleen are not enlarged.  There are no abdominal masses.  There are no abdominal bruits.  Extremities reveal fair pedal pulses.  The left posterior tibial pulse is weaker than the right posterior tibial pulse.  There is no phlebitis or edema.  There is no cyanosis or clubbing.  Strength is normal and symmetrical in all extremities.  There is no lateralizing weakness.  There are no sensory deficits.  The skin is warm and dry.  There is no rash.     Assessment / Plan: Stopp Statin over the next 4 months and see if leg weakness improves.  For better blood pressure control increase amlodipine up to 10 mg daily. Recheck in 4 months for office visit lipid panel hepatic function panel and basal metabolic panel

## 2013-09-20 NOTE — Assessment & Plan Note (Signed)
She has not had any recurrent chest pain or angina.  Cardiac catheterization in July 2014 showed patent grafts

## 2013-09-20 NOTE — Assessment & Plan Note (Signed)
Blood pressure is still running a little high.  She has not been experiencing any peripheral edema and we are increasing her amlodipine up to 10 mg daily.  Her potassium is borderline low at or increasing her potassium 10 mEq twice a day

## 2013-10-09 ENCOUNTER — Other Ambulatory Visit: Payer: Self-pay

## 2013-10-09 MED ORDER — POTASSIUM CHLORIDE CRYS ER 10 MEQ PO TBCR: 10.0000 meq | EXTENDED_RELEASE_TABLET | Freq: Two times a day (BID) | ORAL | Status: DC

## 2013-10-11 ENCOUNTER — Other Ambulatory Visit: Payer: Self-pay

## 2013-12-17 ENCOUNTER — Telehealth: Payer: Self-pay | Admitting: *Deleted

## 2013-12-17 DIAGNOSIS — E785 Hyperlipidemia, unspecified: Secondary | ICD-10-CM

## 2013-12-17 NOTE — Telephone Encounter (Signed)
Dr. Mare Ferrari reviewed patients labs from December. When called patient to discuss she stated she had gone back on Lipitor without any change in her muscle pain.  Dr. Mare Ferrari aware. Scheduled follow up ov and labs for February.

## 2014-01-25 ENCOUNTER — Other Ambulatory Visit (INDEPENDENT_AMBULATORY_CARE_PROVIDER_SITE_OTHER): Payer: Medicare Other

## 2014-01-25 DIAGNOSIS — E785 Hyperlipidemia, unspecified: Secondary | ICD-10-CM

## 2014-01-25 LAB — HEPATIC FUNCTION PANEL
ALT: 67 U/L — ABNORMAL HIGH (ref 0–35)
AST: 50 U/L — ABNORMAL HIGH (ref 0–37)
Albumin: 3.6 g/dL (ref 3.5–5.2)
Alkaline Phosphatase: 61 U/L (ref 39–117)
Bilirubin, Direct: 0.1 mg/dL (ref 0.0–0.3)
Total Bilirubin: 0.9 mg/dL (ref 0.3–1.2)
Total Protein: 6.4 g/dL (ref 6.0–8.3)

## 2014-01-25 LAB — BASIC METABOLIC PANEL WITH GFR
BUN: 10 mg/dL (ref 6–23)
CO2: 27 meq/L (ref 19–32)
Calcium: 9.5 mg/dL (ref 8.4–10.5)
Chloride: 104 meq/L (ref 96–112)
Creatinine, Ser: 0.8 mg/dL (ref 0.4–1.2)
GFR: 76.6 mL/min
Glucose, Bld: 122 mg/dL — ABNORMAL HIGH (ref 70–99)
Potassium: 3.1 meq/L — ABNORMAL LOW (ref 3.5–5.1)
Sodium: 141 meq/L (ref 135–145)

## 2014-01-25 LAB — LDL CHOLESTEROL, DIRECT: Direct LDL: 90.4 mg/dL

## 2014-01-25 LAB — LIPID PANEL
Cholesterol: 153 mg/dL (ref 0–200)
HDL: 43.1 mg/dL (ref >39.00)
Total CHOL/HDL Ratio: 4
Triglycerides: 228 mg/dL — ABNORMAL HIGH (ref 0.0–149.0)
VLDL: 45.6 mg/dL — ABNORMAL HIGH (ref 0.0–40.0)

## 2014-01-25 NOTE — Progress Notes (Signed)
Quick Note:  Please make copy of labs for patient visit. ______ 

## 2014-01-29 ENCOUNTER — Ambulatory Visit: Payer: Medicare Other | Admitting: Cardiology

## 2014-02-08 ENCOUNTER — Encounter: Payer: Self-pay | Admitting: Cardiology

## 2014-02-08 ENCOUNTER — Ambulatory Visit (INDEPENDENT_AMBULATORY_CARE_PROVIDER_SITE_OTHER): Payer: Medicare Other | Admitting: Cardiology

## 2014-02-08 VITALS — BP 135/70 | HR 78 | Ht 61.0 in | Wt 165.0 lb

## 2014-02-08 DIAGNOSIS — F411 Generalized anxiety disorder: Secondary | ICD-10-CM

## 2014-02-08 DIAGNOSIS — I739 Peripheral vascular disease, unspecified: Secondary | ICD-10-CM

## 2014-02-08 DIAGNOSIS — E1165 Type 2 diabetes mellitus with hyperglycemia: Secondary | ICD-10-CM

## 2014-02-08 DIAGNOSIS — I119 Hypertensive heart disease without heart failure: Secondary | ICD-10-CM

## 2014-02-08 DIAGNOSIS — I2510 Atherosclerotic heart disease of native coronary artery without angina pectoris: Secondary | ICD-10-CM

## 2014-02-08 DIAGNOSIS — F419 Anxiety disorder, unspecified: Secondary | ICD-10-CM

## 2014-02-08 DIAGNOSIS — E785 Hyperlipidemia, unspecified: Secondary | ICD-10-CM

## 2014-02-08 DIAGNOSIS — IMO0001 Reserved for inherently not codable concepts without codable children: Secondary | ICD-10-CM

## 2014-02-08 DIAGNOSIS — E876 Hypokalemia: Secondary | ICD-10-CM

## 2014-02-08 LAB — LIPID PANEL
Cholesterol: 159 mg/dL (ref 0–200)
HDL: 44 mg/dL (ref >39.00)
LDL Cholesterol: 78 mg/dL (ref 0–99)
Total CHOL/HDL Ratio: 4
Triglycerides: 184 mg/dL — ABNORMAL HIGH (ref 0.0–149.0)
VLDL: 36.8 mg/dL (ref 0.0–40.0)

## 2014-02-08 LAB — HEPATIC FUNCTION PANEL
ALT: 86 U/L — ABNORMAL HIGH (ref 0–35)
AST: 94 U/L — ABNORMAL HIGH (ref 0–37)
Albumin: 3.9 g/dL (ref 3.5–5.2)
Alkaline Phosphatase: 59 U/L (ref 39–117)
Bilirubin, Direct: 0.1 mg/dL (ref 0.0–0.3)
Total Bilirubin: 0.8 mg/dL (ref 0.3–1.2)
Total Protein: 6.6 g/dL (ref 6.0–8.3)

## 2014-02-08 LAB — BASIC METABOLIC PANEL
BUN: 13 mg/dL (ref 6–23)
CO2: 28 mEq/L (ref 19–32)
Calcium: 10.7 mg/dL — ABNORMAL HIGH (ref 8.4–10.5)
Chloride: 100 mEq/L (ref 96–112)
Creatinine, Ser: 0.8 mg/dL (ref 0.4–1.2)
GFR: 77.73 mL/min (ref >60.00)
Glucose, Bld: 88 mg/dL (ref 70–99)
Potassium: 3.3 mEq/L — ABNORMAL LOW (ref 3.5–5.1)
Sodium: 141 mEq/L (ref 135–145)

## 2014-02-08 LAB — HEMOGLOBIN A1C: Hgb A1c MFr Bld: 6.3 % (ref 4.6–6.5)

## 2014-02-08 MED ORDER — POTASSIUM CHLORIDE CRYS ER 10 MEQ PO TBCR: EXTENDED_RELEASE_TABLET | ORAL | Status: DC

## 2014-02-08 NOTE — Patient Instructions (Signed)
Will obtain labs today and call you with the results (A1C)  [place] [facility] [place]   Your physician wants you to follow-up in: 4 months with fasting labs (lp/bmet/hfp)  AND EKG  You will receive a reminder letter in the mail two months in advance. If you don't receive a letter, please call our office to schedule the follow-up appointment.

## 2014-02-08 NOTE — Assessment & Plan Note (Signed)
The patient has not been experiencing any exertional chest pain to suggest recurrent angina.

## 2014-02-08 NOTE — Assessment & Plan Note (Signed)
The patient's blood sugars have been running high.  Her weight is up 1 pound since last visit.  We will get a hemoglobin A1c today.  She has been on metformin since December 2014.

## 2014-02-08 NOTE — Progress Notes (Signed)
Megan [name] Date of Birth:  Mar 10, 1946 [address] [name], [place]  [address] [phone]         Fax   [phone]  History of Present Illness: This pleasant 68 year old woman is seen for a four-month followup office visit. She has a past history of essential hypertension and a past history of coronary artery disease. Also has a history of dyslipidemia. She is status post coronary artery bypass graft surgery in 1995. She had a renal artery stent for renal artery stenosis in 2008. She had a stent graft of an abdominal aortic aneurysm 2009. She has had chronic mild elevation of liver function enzymes secondary to modest alcohol intake and to fatty liver. In April 2013 she was hospitalized briefly [name] for dizziness and was found to be hypokalemic. She has been weaned off her Demadex and off for her potassium and has had no further episodes of dizziness. Her blood pressure is followed closely by her primary care provider.  Since her last office visit she was hospitalized in July 2014 and had cardiac catheterization which showed that her grafts were open.  There was no evidence of myocardial damage.  She was subsequently sent to her gastroenterologist [name] who increased her Prilosec from 20 mg to 40 mg daily and her chest discomfort has resolved.  She is now walking at the gym 3 days a week.  She has had some back pain and is seeing a physical therapist also.  Since last visit she has been complaining of leg pain radiating up to the left groin when she walks.  She has claudication at about 1 block.  She had had arterial Dopplers on 05/25/13 which showed normal ABIs.  Her vascular surgeon is [name].  Current Outpatient Prescriptions  Medication Sig Dispense Refill  . ALPRAZolam (XANAX) 0.25 MG tablet TAKE 1 TABLET BY MOUTH EVERY DAY AS NEEDED  90 tablet  1  . amLODipine (NORVASC) 10 MG tablet Take 1 tablet (10 mg total) by mouth daily.  30 tablet  11  . aspirin  325 MG tablet Take 325 mg by mouth daily.        [name] atorvastatin (LIPITOR) 40 MG tablet Take 40 mg by mouth daily.      . Cholecalciferol (VITAMIN D3) 5000 UNITS TABS Take 1 tablet by mouth once a week. Take on Friday      . ciclopirox (LOPROX) 0.77 % cream Apply topically 2 (two) times daily.       . clindamycin (CLINDAGEL) 1 % gel Apply 1 application topically 2 (two) times daily as needed (for yeast infection).       . Coenzyme Q10 (CO Q 10) 100 MG CAPS Take 1 capsule by mouth daily.      [name] losartan-hydrochlorothiazide (HYZAAR) 100-12.5 MG per tablet Take 1 tablet by mouth daily.  30 tablet  11  . METFORMIN HCL PO Take by mouth daily.      . metroNIDAZOLE (METROGEL) 1 % gel Apply 1 application topically as needed.       . nitroGLYCERIN (NITROSTAT) 0.4 MG SL tablet Place 0.4 mg under the tongue every 5 (five) minutes as needed for chest pain.      [name] omeprazole (PRILOSEC) 40 MG capsule Take 1 capsule (40 mg total) by mouth daily.  30 capsule  11  . potassium chloride (K-DUR,KLOR-CON) 10 MEQ tablet 2 TABLET TWICE A DAY  360 tablet  3  . PRENATAL VITAMINS PO Take 1 tablet  by mouth daily.        . Probiotic Product (PROBIOTIC DAILY PO) Take by mouth.      . RESTASIS 0.05 % ophthalmic emulsion Place 1 drop into both eyes every 12 (twelve) hours.       . VOLTAREN 1 % GEL Apply 2 g topically daily as needed (for joint pain).        No current facility-administered medications for this visit.    Allergies  Allergen Reactions  . Bextra [Valdecoxib]     Increased lft's  . Codeine     REACTION: rash, itching  . Lipitor [Atorvastatin]     LEG WEAKNESS  . Lisinopril Cough  . Metoprolol     Hair loss  . Mevacor [Lovastatin]     "arthritis"  . Morphine And Related Nausea Only  . Plavix [Clopidogrel Bisulfate]     Itching of hands and feet  . Zocor [Simvastatin]     Bones hurt   . Hydrocod Polst-Cpm Polst Er Rash    Patient Active Problem List   Diagnosis Date Noted  . Benign  hypertensive heart disease without heart failure 05/18/2011    Priority: High  . CAD (coronary artery disease) 05/18/2011    Priority: High  . Hyperlipidemia     Priority: High  . Claudication of lower extremity 02/08/2014  . Atherosclerosis of renal artery 09/04/2013  . Coronary artery disease   . Unstable angina 06/09/2013  . Peripheral vascular disease with claudication   . Abnormal LFTs (liver function tests) 09/19/2012  . Diverticulosis 09/19/2012  . Hypokalemia 03/23/2012  . History of abdominal aortic aneurysm repair 05/18/2011  . History of renal artery stenosis 05/18/2011    History  Smoking status  . Former Smoker  . Types: Cigarettes  . Quit date: 12/07/1995  Smokeless tobacco  . Never Used    History  Alcohol Use  . 1.2 oz/week  . 2 Glasses of wine per week    Comment: 8 drinks per wwek    Family History  Problem Relation Age of Onset  . Heart disease Mother   . Heart disease Father   . Heart disease Sister   . Diabetes Sister   . Heart disease Brother   . Diabetes Brother   . Colon cancer Paternal Grandmother   . Pancreatic cancer Neg Hx   . Rectal cancer Neg Hx   . Stomach cancer Neg Hx   . Esophageal cancer Neg Hx     Review of Systems: Constitutional: no fever chills diaphoresis or fatigue or change in weight.  Head and neck: no hearing loss, no epistaxis, no photophobia or visual disturbance. Respiratory: No cough, shortness of breath or wheezing. Cardiovascular: No chest pain peripheral edema, palpitations. Gastrointestinal: No abdominal distention, no abdominal pain, no change in bowel habits hematochezia or melena. Genitourinary: No dysuria, no frequency, no urgency, no nocturia. Musculoskeletal:No arthralgias, no back pain, no gait disturbance or myalgias. Neurological: No dizziness, no headaches, no numbness, no seizures, no syncope, no weakness, no tremors. Hematologic: No lymphadenopathy, no easy bruising. Psychiatric: No confusion,  no hallucinations, no sleep disturbance.    Physical Exam: Filed Vitals:   02/08/14 1114  BP: 135/70  Pulse: 78   the general appearance reveals a well-developed well-nourished woman in no distress.  Her weight is down 5 pounds since last visit.The head and neck exam reveals pupils equal and reactive.  Extraocular movements are full.  There is no scleral icterus.  The mouth and pharynx are normal.  The neck is supple.  The carotids reveal no bruits.  The jugular venous pressure is normal.  The  thyroid is not enlarged.  There is no lymphadenopathy.  The chest is clear to percussion and auscultation.  There are no rales or rhonchi.  Expansion of the chest is symmetrical.  The precordium is quiet.  The first heart sound is normal.  The second heart sound is physiologically split.  There is no murmur gallop rub or click.  There is no abnormal lift or heave.  The abdomen is soft and nontender.  The bowel sounds are normal.  The liver and spleen are not enlarged.  There are no abdominal masses.  There are no abdominal bruits.  Extremities reveal fair pedal pulses.  The left posterior tibial pulse is weaker than the right posterior tibial pulse.  There is no phlebitis or edema.  There is no cyanosis or clubbing.  Strength is normal and symmetrical in all extremities.  There is no lateralizing weakness.  There are no sensory deficits.  The skin is warm and dry.  There is no rash.     Assessment / Plan: Increase potassium repletion to 10 mEq taking 2 tablets twice a day.  She takes her diuretic only occasionally for symptomatic edema of the right lower leg and ankle. In regard to her diabetes we are checking an A1c today.  She will be contacting her endocrinologist Dr. Forde Dandy for followup of her diabetes.  He already follows her for her thyroid problems. She will contact [name] concerning her worsening claudication left leg. Continue other medications the same and be rechecked here in 4 months for office  visit EKG lipid panel hepatic function panel and basal metabolic panel.  Liver functions are still mildly elevated so she needs to cut back on alcohol and to try to lose more weight.

## 2014-02-08 NOTE — Assessment & Plan Note (Signed)
Blood pressure is remaining stable on current therapy.  However her serum potassium is low at 3.1 and we will increase her potassium to 10 mEq taking 2 tablets twice a day

## 2014-02-08 NOTE — Assessment & Plan Note (Signed)
Her claudication in the left leg has worsened significantly since we last saw her.  It begins in the left calf and radiates up into the left hip and groin area.  She will call her vascular surgeon Dr. Sherren Mocha Early for further evaluation.

## 2014-02-10 MED ORDER — TORSEMIDE 20 MG PO TABS: 20.0000 mg | ORAL_TABLET | Freq: Two times a day (BID) | ORAL | Status: DC

## 2014-02-10 MED ORDER — ALPRAZOLAM 0.25 MG PO TABS: ORAL_TABLET | ORAL | Status: DC

## 2014-02-12 ENCOUNTER — Telehealth: Payer: Self-pay

## 2014-02-12 DIAGNOSIS — I70219 Atherosclerosis of native arteries of extremities with intermittent claudication, unspecified extremity: Secondary | ICD-10-CM

## 2014-02-12 NOTE — Telephone Encounter (Signed)
Discussed pt's hx. with [name], and of the recent note by Dr. Mare Ferrari, noting worsening claudication in (L) leg.  Per [name], schedule ABI's and office visit.  Attempted to contact pt. per phone.  Left message that appt. will be scheduled for evaluation of left leg claudication.

## 2014-02-12 NOTE — Telephone Encounter (Signed)
Message copied by Denman George on Tue Feb 12, 2014  9:41 AM ------      Message from: Gena Fray      Created: Fri Feb 08, 2014  3:38 PM      Regarding: Orders/Appointment ?       Megan [name] called to make an appt with [facility] after Dr Bobbye Riggs recommendation. She is established here. She is having worsening claudication type symptoms. Dr Mare Ferrari documented it all in his note from today. Can I just schedule an appointment for her, or does this need to be triaged for order purposes?             Megan [name] isn't expecting a call today, as she realizes it is late in the day. I let her know that we would be back in touch next week.            Thanks,      Hinton Dyer ------

## 2014-02-12 NOTE — Telephone Encounter (Signed)
02/12/14:I spoke with Megan [name]- she is scheduled for ABIs this week on 03/12 at 9:30 and will return to see [facility] on 02/19/14 @ 1:00. She is agreeable with this plan. Of note, she has requested a female vascular technician for her study. I have noted this in the appointment notes. dpm

## 2014-02-14 ENCOUNTER — Ambulatory Visit (HOSPITAL_COMMUNITY)
Admission: RE | Admit: 2014-02-14 | Discharge: 2014-02-14 | Disposition: A | Discharge status: Home / Self Care | Payer: Medicare Other | Source: Ambulatory Visit | Attending: Vascular Surgery | Admitting: Vascular Surgery

## 2014-02-14 DIAGNOSIS — I70219 Atherosclerosis of native arteries of extremities with intermittent claudication, unspecified extremity: Secondary | ICD-10-CM

## 2014-02-18 ENCOUNTER — Encounter: Payer: Self-pay | Admitting: Family

## 2014-02-19 ENCOUNTER — Encounter: Payer: Self-pay | Admitting: Vascular Surgery

## 2014-02-19 ENCOUNTER — Ambulatory Visit (INDEPENDENT_AMBULATORY_CARE_PROVIDER_SITE_OTHER): Payer: Medicare Other | Admitting: Vascular Surgery

## 2014-02-19 VITALS — BP 112/71 | HR 86 | Resp 16 | Ht 61.5 in | Wt 160.0 lb

## 2014-02-19 DIAGNOSIS — I714 Abdominal aortic aneurysm, without rupture, unspecified: Secondary | ICD-10-CM

## 2014-02-19 NOTE — Progress Notes (Signed)
Here today for continued discussion regarding her left leg claudication symptoms. She is very active 68 year old female who is status post stent graft repair aortic aneurysm by Dr. Amedeo Plenty in 2009. She has reported increasing left leg claudication. This initially was in her calf and now extends up into her thigh and buttock. This is the limiting to her since she is quite active and walking program. He has no tissue loss and no rest pain. Reports this is much worse if she is trying to walk up an incline or walk rapidly.  Past Medical History  Diagnosis Date  . Coronary artery disease     a. s/p CABG in 1995;  b. 05/2013 Neg MV, EF 82%;  c. 06/2013 Cath: LM nl, LAD 40-50p, LCX nl, RCA 80p/125m, VG->RCA->PDA->PLSA min irregs, LIMA->LAD atretic, vigorous LV fxn.  . Renal artery stenosis     a. 2008 s/p PTA  . Hypertension   . Hyperlipidemia   . Osteoarthritis   . Fibromyalgia   . Eczema, dyshidrotic   . GERD (gastroesophageal reflux disease)   . AAA (abdominal aortic aneurysm)     a. s/p stent grafting in 2009.  . Diverticulosis   . Colon polyp   . Peripheral arterial disease     left leg diagnosed by Dr Mare Ferrari    History  Substance Use Topics  . Smoking status: Former Smoker    Types: Cigarettes    Quit date: 12/07/1995  . Smokeless tobacco: Never Used  . Alcohol Use: 1.2 oz/week    2 Glasses of wine per week     Comment: 8 drinks per wwek    Family History  Problem Relation Age of Onset  . Heart disease Mother   . Heart disease Father   . Heart disease Sister   . Diabetes Sister   . Heart disease Brother   . Diabetes Brother   . Colon cancer Paternal Grandmother   . Pancreatic cancer Neg Hx   . Rectal cancer Neg Hx   . Stomach cancer Neg Hx   . Esophageal cancer Neg Hx     Allergies  Allergen Reactions  . Bextra [Valdecoxib]     Increased lft's  . Codeine     REACTION: rash, itching  . Lipitor [Atorvastatin]     LEG WEAKNESS  . Lisinopril Cough  . Metoprolol    Hair loss  . Mevacor [Lovastatin]     "arthritis"  . Morphine And Related Nausea Only  . Plavix [Clopidogrel Bisulfate]     Itching of hands and feet  . Zocor [Simvastatin]     Bones hurt   . Hydrocod Polst-Cpm Polst Er Rash    Current outpatient prescriptions:ALPRAZolam (XANAX) 0.25 MG tablet, TAKE 1 TABLET BY MOUTH EVERY DAY AS NEEDED, Disp: 90 tablet, Rfl: 1;  amLODipine (NORVASC) 10 MG tablet, Take 1 tablet (10 mg total) by mouth daily., Disp: 30 tablet, Rfl: 11;  aspirin 325 MG tablet, Take 325 mg by mouth daily.  , Disp: , Rfl: ;  atorvastatin (LIPITOR) 40 MG tablet, Take 40 mg by mouth daily., Disp: , Rfl:  Cholecalciferol (VITAMIN D3) 5000 UNITS TABS, Take 1 tablet by mouth once a week. Take on Friday, Disp: , Rfl: ;  ciclopirox (LOPROX) 0.77 % cream, Apply topically 2 (two) times daily. , Disp: , Rfl: ;  clindamycin (CLINDAGEL) 1 % gel, Apply 1 application topically 2 (two) times daily as needed (for yeast infection). , Disp: , Rfl: ;  Coenzyme Q10 (CO Q 10) 100  MG CAPS, Take 1 capsule by mouth daily., Disp: , Rfl:  losartan-hydrochlorothiazide (HYZAAR) 100-12.5 MG per tablet, Take 1 tablet by mouth daily., Disp: 30 tablet, Rfl: 11;  METFORMIN HCL PO, Take by mouth daily., Disp: , Rfl: ;  metroNIDAZOLE (METROGEL) 1 % gel, Apply 1 application topically as needed. , Disp: , Rfl: ;  nitroGLYCERIN (NITROSTAT) 0.4 MG SL tablet, Place 0.4 mg under the tongue every 5 (five) minutes as needed for chest pain., Disp: , Rfl:  omeprazole (PRILOSEC) 40 MG capsule, Take 1 capsule (40 mg total) by mouth daily., Disp: 30 capsule, Rfl: 11;  potassium chloride (K-DUR,KLOR-CON) 10 MEQ tablet, 2 TABLET TWICE A DAY, Disp: 360 tablet, Rfl: 3;  PRENATAL VITAMINS PO, Take 1 tablet by mouth daily.  , Disp: , Rfl: ;  Probiotic Product (PROBIOTIC DAILY PO), Take by mouth., Disp: , Rfl:  RESTASIS 0.05 % ophthalmic emulsion, Place 1 drop into both eyes every 12 (twelve) hours. , Disp: , Rfl: ;  torsemide (DEMADEX) 20 MG  tablet, Take 1 tablet (20 mg total) by mouth 2 (two) times daily., Disp: 30 tablet, Rfl: 3;  VOLTAREN 1 % GEL, Apply 2 g topically daily as needed (for joint pain). , Disp: , Rfl:   BP 112/71  Pulse 86  Resp 16  Ht 5' 1.5" (1.562 m)  Wt 160 lb (72.576 kg)  BMI 29.75 kg/m2  Body mass index is 29.75 kg/(m^2).       Physical exam: Well-developed well-nourished white female in no acute distress Respirations are nonlabored Abdomen soft nontender no palpable aneurysm Pulse status palpable femoral pulses bilaterally. She has a 2+ right dorsalis pedis and 1+ left dorsalis pedis pulse Neurologically she is grossly intact  Noninvasive vascular studies were reviewed and discussed with the patient. This shows monophasic flow in the left foot with a ankle arm index of 0.76. On the right she has triphasic waveforms and ankle arm index of 1.0  I did review her CT angiogram for followup or stent graft in September 2014. This shows flow through the left limb of the stent graft but there is some suggestion of potential kinking at the distal end of the iliac limb.  Impression and plan progressive numbing claudication left leg. Suspect this may be due to the left limb of her stent graft. Have recommended arteriography for further evaluation and explained the high likelihood of being able to successfully treat this at the same setting with the stenting. She wishes to proceed as soon as possible we have scheduled this as an outpatient at [facility] on 03/06/2014

## 2014-02-25 ENCOUNTER — Other Ambulatory Visit: Payer: Self-pay | Admitting: *Deleted

## 2014-02-26 ENCOUNTER — Encounter (HOSPITAL_COMMUNITY): Payer: Self-pay | Admitting: Pharmacy Technician

## 2014-03-05 MED ORDER — SODIUM CHLORIDE 0.9 % IV SOLN
INTRAVENOUS | Status: DC
  Administered 2014-03-06: 11:00:00 via INTRAVENOUS

## 2014-03-06 ENCOUNTER — Encounter (HOSPITAL_COMMUNITY)
Admission: RE | Disposition: A | Discharge status: Home / Self Care | Payer: Self-pay | Source: Ambulatory Visit | Attending: Vascular Surgery

## 2014-03-06 ENCOUNTER — Ambulatory Visit (HOSPITAL_COMMUNITY)
Admission: RE | Admit: 2014-03-06 | Discharge: 2014-03-06 | Disposition: A | Discharge status: Home / Self Care | Payer: Medicare Other | Source: Ambulatory Visit | Attending: Vascular Surgery | Admitting: Vascular Surgery

## 2014-03-06 DIAGNOSIS — Z860100 Personal history of colon polyps, unspecified: Secondary | ICD-10-CM | POA: Insufficient documentation

## 2014-03-06 DIAGNOSIS — Z87891 Personal history of nicotine dependence: Secondary | ICD-10-CM | POA: Insufficient documentation

## 2014-03-06 DIAGNOSIS — E785 Hyperlipidemia, unspecified: Secondary | ICD-10-CM | POA: Insufficient documentation

## 2014-03-06 DIAGNOSIS — I70219 Atherosclerosis of native arteries of extremities with intermittent claudication, unspecified extremity: Secondary | ICD-10-CM

## 2014-03-06 DIAGNOSIS — L301 Dyshidrosis [pompholyx]: Secondary | ICD-10-CM | POA: Insufficient documentation

## 2014-03-06 DIAGNOSIS — I739 Peripheral vascular disease, unspecified: Secondary | ICD-10-CM | POA: Insufficient documentation

## 2014-03-06 DIAGNOSIS — I10 Essential (primary) hypertension: Secondary | ICD-10-CM | POA: Insufficient documentation

## 2014-03-06 DIAGNOSIS — K219 Gastro-esophageal reflux disease without esophagitis: Secondary | ICD-10-CM | POA: Insufficient documentation

## 2014-03-06 DIAGNOSIS — M1990 Unspecified osteoarthritis, unspecified site: Secondary | ICD-10-CM | POA: Insufficient documentation

## 2014-03-06 DIAGNOSIS — Z7982 Long term (current) use of aspirin: Secondary | ICD-10-CM | POA: Insufficient documentation

## 2014-03-06 DIAGNOSIS — K5730 Diverticulosis of large intestine without perforation or abscess without bleeding: Secondary | ICD-10-CM | POA: Insufficient documentation

## 2014-03-06 DIAGNOSIS — T82898A Other specified complication of vascular prosthetic devices, implants and grafts, initial encounter: Secondary | ICD-10-CM

## 2014-03-06 DIAGNOSIS — I2510 Atherosclerotic heart disease of native coronary artery without angina pectoris: Secondary | ICD-10-CM | POA: Insufficient documentation

## 2014-03-06 DIAGNOSIS — Z951 Presence of aortocoronary bypass graft: Secondary | ICD-10-CM | POA: Insufficient documentation

## 2014-03-06 DIAGNOSIS — IMO0001 Reserved for inherently not codable concepts without codable children: Secondary | ICD-10-CM | POA: Insufficient documentation

## 2014-03-06 DIAGNOSIS — I708 Atherosclerosis of other arteries: Secondary | ICD-10-CM | POA: Insufficient documentation

## 2014-03-06 HISTORY — PX: ABDOMINAL AORTAGRAM: SHX5454

## 2014-03-06 LAB — POCT I-STAT, CHEM 8
BUN: 13 mg/dL (ref 6–23)
Calcium, Ion: 1.24 mmol/L (ref 1.13–1.30)
Chloride: 98 mEq/L (ref 96–112)
Creatinine, Ser: 0.9 mg/dL (ref 0.50–1.10)
Glucose, Bld: 114 mg/dL — ABNORMAL HIGH (ref 70–99)
HCT: 44 % (ref 36.0–46.0)
Hemoglobin: 15 g/dL (ref 12.0–15.0)
Potassium: 3.4 mEq/L — ABNORMAL LOW (ref 3.7–5.3)
Sodium: 141 mEq/L (ref 137–147)
TCO2: 30 mmol/L (ref 0–100)

## 2014-03-06 LAB — POCT ACTIVATED CLOTTING TIME
Activated Clotting Time: 210 seconds
Activated Clotting Time: 171 seconds
Activated Clotting Time: 182 seconds

## 2014-03-06 LAB — GLUCOSE, CAPILLARY: Glucose-Capillary: 89 mg/dL (ref 70–99)

## 2014-03-06 SURGERY — ABDOMINAL AORTAGRAM
Anesthesia: LOCAL

## 2014-03-06 MED ORDER — HEPARIN (PORCINE) IN NACL 2-0.9 UNIT/ML-% IJ SOLN
INTRAMUSCULAR | Status: AC
  Filled 2014-03-06: qty 1000

## 2014-03-06 MED ORDER — MIDAZOLAM HCL 2 MG/2ML IJ SOLN
INTRAMUSCULAR | Status: AC
  Filled 2014-03-06: qty 2

## 2014-03-06 MED ORDER — ATROPINE SULFATE 0.1 MG/ML IJ SOLN
1.0000 mg | Freq: Once | INTRAMUSCULAR | Status: AC
Start: 2014-03-06 — End: 2014-03-06
  Administered 2014-03-06: 0.6 mg via INTRAVENOUS

## 2014-03-06 MED ORDER — HEPARIN SODIUM (PORCINE) 1000 UNIT/ML IJ SOLN
INTRAMUSCULAR | Status: AC
  Filled 2014-03-06: qty 1

## 2014-03-06 MED ORDER — LIDOCAINE HCL (PF) 1 % IJ SOLN
INTRAMUSCULAR | Status: AC
  Filled 2014-03-06: qty 30

## 2014-03-06 MED ORDER — SODIUM CHLORIDE 0.9 % IV SOLN: 1.0000 mL/kg/h | INTRAVENOUS | Status: DC

## 2014-03-06 MED ORDER — MORPHINE SULFATE 4 MG/ML IJ SOLN
1.0000 mg | INTRAMUSCULAR | Status: DC | PRN
  Administered 2014-03-06: 2 mg via INTRAVENOUS

## 2014-03-06 MED ORDER — MORPHINE SULFATE 2 MG/ML IJ SOLN
INTRAMUSCULAR | Status: AC
  Filled 2014-03-06: qty 1

## 2014-03-06 MED ORDER — OXYCODONE-ACETAMINOPHEN 5-325 MG PO TABS
1.0000 | ORAL_TABLET | ORAL | Status: DC | PRN
Start: 2014-03-06 — End: 2014-03-07
  Administered 2014-03-06 (×2): 2 via ORAL
  Filled 2014-03-06: qty 2

## 2014-03-06 MED ORDER — FENTANYL CITRATE 0.05 MG/ML IJ SOLN
INTRAMUSCULAR | Status: AC
  Filled 2014-03-06: qty 2

## 2014-03-06 MED ORDER — OXYCODONE-ACETAMINOPHEN 5-325 MG PO TABS
ORAL_TABLET | ORAL | Status: AC
  Filled 2014-03-06: qty 2

## 2014-03-06 MED ORDER — NITROGLYCERIN 0.2 MG/ML ON CALL CATH LAB
INTRAVENOUS | Status: AC
  Filled 2014-03-06: qty 1

## 2014-03-06 NOTE — Progress Notes (Signed)
i-stat K 3.4; Barbaraann Rondo (cath lab) states he will relay to [name]

## 2014-03-06 NOTE — Discharge Instructions (Signed)
Angiography, Care After Refer to this sheet in the next few weeks. These instructions provide you with information on caring for yourself after your procedure. Your health care provider may also give you more specific instructions. Your treatment has been planned according to current medical practices, but problems sometimes occur. Call your health care provider if you have any problems or questions after your procedure.  WHAT TO EXPECT AFTER THE PROCEDURE After your procedure, it is typical to have the following sensations:  Minor discomfort or tenderness and a small bump at the catheter insertion site. The bump should usually decrease in size and tenderness within 1 to 2 weeks.  Any bruising will usually fade within 2 to 4 weeks. HOME CARE INSTRUCTIONS   You may need to keep taking blood thinners if they were prescribed for you. Only take over-the-counter or prescription medicines for pain, fever, or discomfort as directed by your health care provider.  Do not apply powder or lotion to the site.  Do not sit in a bathtub, swimming pool, or whirlpool for 5 to 7 days.  You may shower 24 hours after the procedure. Remove the bandage (dressing) and gently wash the site with plain soap and water. Gently pat the site dry.  Inspect the site at least twice daily.  Limit your activity for the first 48 hours. Do not bend, squat, or lift anything over 20 lb (9 kg) or as directed by your health care provider.  Do not drive home if you are discharged the day of the procedure. Have someone else drive you. Follow instructions about when you can drive or return to work. SEEK MEDICAL CARE IF:  You get lightheaded when standing up.  You have drainage (other than a small amount of blood on the dressing).  You have chills.  You have a fever.  You have redness, warmth, swelling, or pain at the insertion site. SEEK IMMEDIATE MEDICAL CARE IF:   You develop chest pain or shortness of breath, feel faint,  or pass out.  You have bleeding, swelling larger than a walnut, or drainage from the catheter insertion site.  You develop pain, discoloration, coldness, or severe bruising in the leg or arm that held the catheter.  You have heavy bleeding from the site. If this happens, hold pressure on the site. MAKE SURE YOU:  Understand these instructions.  Will watch your condition.  Will get help right away if you are not doing well or get worse. Document Released: 06/10/2005 Document Revised: 07/25/2013 Document Reviewed: 04/16/2013 [facility] Patient Information 2014 [place].

## 2014-03-06 NOTE — Op Note (Signed)
    OPERATIVE REPORT  DATE OF SURGERY: 03/06/2014  PATIENT: Megan [name], 68 y.o. female MRN: [id number]  DOB: Jan 07, 1946  PRE-OPERATIVE DIAGNOSIS: Left leg claudication with obstruction of flow through the left limb of prior stent graft  POST-OPERATIVE DIAGNOSIS:  Same  PROCEDURE: Aortogram via the left brachial approach, stenting with a [employer] breast 8 x 27 stent across the junction of the left limb of the stent graft into the external iliac artery  SURGEON:  Curt Jews, M.D.  PHYSICIAN ASSISTANT: Nurse  ANESTHESIA:  Local with sedation  EBL: Minimal ml     BLOOD ADMINISTERED: None  DRAINS: None  SPECIMEN: None  COUNTS CORRECT:  YES  PLAN OF CARE: PACU   PATIENT DISPOSITION:  PACU - hemodynamically stable  PROCEDURE DETAILS: Patient taken to profess cath I placed supine position where the area of both groins are prepped in usual fashion. Using SonoSite ultrasound a single wall puncture the left common femoral artery was accessed with an 18-gauge needle guidewire was passed centrally and would not pass the level of the prior placed aortic stent graft. Prior a CT that showed that this limb of the graft extended down to the bed and into the iliac artery. Kumpe catheter was used to attempt to cross this area this was not possible. A 5 French sheath had been placed in the left groin. For this reason decision was made to use a left brachial approach. Using local anesthesia the SonoSite ultrasound the left brachial artery above the antecubital space was accessed with a micropuncture technique. A guidewire was passed centrally and this was exchanged for a 5 [place] sheath. The pigtail catheter was used to position the guidewire down the descending thoracic aorta down into the stent graft. The left limb of the stent graft was selected. The guidewire wouldn't go past the area of entrapment of the left limb of the stent graft. The long 7 French sheath was then placed. The  patient was given 2000 units of heparin and nitroglycerin through the initial sheath. Once the along [place] sheath a long 7 sheath was passed down into the stent graft patient was given an additional 4000 of heparin. A guidewire was passed through the limb graft would preferentially go down the internal iliac artery. Angle catheter was used to direct this into the external iliac artery on the left. I attempt to place the 8 x 24 [employer] express stent across this lesion. It was too tight to progress. For this reason it was removed and a 5 balloon was placed across the stenosis and redilated. The stent was replaced and was able to be passed across the evidence of Barrett stenosis. An injection through the sheath revealed the exact location of the positioning of this. The balloon was a dilated stent across the high-grade stenosis at this area. The balloon was then withdrawn into the stent graft which was of larger caliber and was redilated as well. Repeat arteriogram showed excellent positioning of the stent and no evidence of residual stenosis. The 7 French sheath was withdrawn and exchanged for a short 7 French sheath and the brachial artery. The patient was transferred to the holding area where the sheaths were removed after ACT was normalized   Curt Jews, M.D. 03/06/2014 4:33 PM

## 2014-03-06 NOTE — H&P (View-Only) (Signed)
Here today for continued discussion regarding her left leg claudication symptoms. She is very active 68 year old female who is status post stent graft repair aortic aneurysm by Dr. Amedeo Plenty in 2009. She has reported increasing left leg claudication. This initially was in her calf and now extends up into her thigh and buttock. This is the limiting to her since she is quite active and walking program. He has no tissue loss and no rest pain. Reports this is much worse if she is trying to walk up an incline or walk rapidly.  Past Medical History  Diagnosis Date  . Coronary artery disease     a. s/p CABG in 1995;  b. 05/2013 Neg MV, EF 82%;  c. 06/2013 Cath: LM nl, LAD 40-50p, LCX nl, RCA 80p/154m, VG->RCA->PDA->PLSA min irregs, LIMA->LAD atretic, vigorous LV fxn.  . Renal artery stenosis     a. 2008 s/p PTA  . Hypertension   . Hyperlipidemia   . Osteoarthritis   . Fibromyalgia   . Eczema, dyshidrotic   . GERD (gastroesophageal reflux disease)   . AAA (abdominal aortic aneurysm)     a. s/p stent grafting in 2009.  . Diverticulosis   . Colon polyp   . Peripheral arterial disease     left leg diagnosed by Dr Mare Ferrari    History  Substance Use Topics  . Smoking status: Former Smoker    Types: Cigarettes    Quit date: 12/07/1995  . Smokeless tobacco: Never Used  . Alcohol Use: 1.2 oz/week    2 Glasses of wine per week     Comment: 8 drinks per wwek    Family History  Problem Relation Age of Onset  . Heart disease Mother   . Heart disease Father   . Heart disease Sister   . Diabetes Sister   . Heart disease Brother   . Diabetes Brother   . Colon cancer Paternal Grandmother   . Pancreatic cancer Neg Hx   . Rectal cancer Neg Hx   . Stomach cancer Neg Hx   . Esophageal cancer Neg Hx     Allergies  Allergen Reactions  . Bextra [Valdecoxib]     Increased lft's  . Codeine     REACTION: rash, itching  . Lipitor [Atorvastatin]     LEG WEAKNESS  . Lisinopril Cough  . Metoprolol    Hair loss  . Mevacor [Lovastatin]     "arthritis"  . Morphine And Related Nausea Only  . Plavix [Clopidogrel Bisulfate]     Itching of hands and feet  . Zocor [Simvastatin]     Bones hurt   . Hydrocod Polst-Cpm Polst Er Rash    Current outpatient prescriptions:ALPRAZolam (XANAX) 0.25 MG tablet, TAKE 1 TABLET BY MOUTH EVERY DAY AS NEEDED, Disp: 90 tablet, Rfl: 1;  amLODipine (NORVASC) 10 MG tablet, Take 1 tablet (10 mg total) by mouth daily., Disp: 30 tablet, Rfl: 11;  aspirin 325 MG tablet, Take 325 mg by mouth daily.  , Disp: , Rfl: ;  atorvastatin (LIPITOR) 40 MG tablet, Take 40 mg by mouth daily., Disp: , Rfl:  Cholecalciferol (VITAMIN D3) 5000 UNITS TABS, Take 1 tablet by mouth once a week. Take on Friday, Disp: , Rfl: ;  ciclopirox (LOPROX) 0.77 % cream, Apply topically 2 (two) times daily. , Disp: , Rfl: ;  clindamycin (CLINDAGEL) 1 % gel, Apply 1 application topically 2 (two) times daily as needed (for yeast infection). , Disp: , Rfl: ;  Coenzyme Q10 (CO Q 10) 100  MG CAPS, Take 1 capsule by mouth daily., Disp: , Rfl:  losartan-hydrochlorothiazide (HYZAAR) 100-12.5 MG per tablet, Take 1 tablet by mouth daily., Disp: 30 tablet, Rfl: 11;  METFORMIN HCL PO, Take by mouth daily., Disp: , Rfl: ;  metroNIDAZOLE (METROGEL) 1 % gel, Apply 1 application topically as needed. , Disp: , Rfl: ;  nitroGLYCERIN (NITROSTAT) 0.4 MG SL tablet, Place 0.4 mg under the tongue every 5 (five) minutes as needed for chest pain., Disp: , Rfl:  omeprazole (PRILOSEC) 40 MG capsule, Take 1 capsule (40 mg total) by mouth daily., Disp: 30 capsule, Rfl: 11;  potassium chloride (K-DUR,KLOR-CON) 10 MEQ tablet, 2 TABLET TWICE A DAY, Disp: 360 tablet, Rfl: 3;  PRENATAL VITAMINS PO, Take 1 tablet by mouth daily.  , Disp: , Rfl: ;  Probiotic Product (PROBIOTIC DAILY PO), Take by mouth., Disp: , Rfl:  RESTASIS 0.05 % ophthalmic emulsion, Place 1 drop into both eyes every 12 (twelve) hours. , Disp: , Rfl: ;  torsemide (DEMADEX) 20 MG  tablet, Take 1 tablet (20 mg total) by mouth 2 (two) times daily., Disp: 30 tablet, Rfl: 3;  VOLTAREN 1 % GEL, Apply 2 g topically daily as needed (for joint pain). , Disp: , Rfl:   BP 112/71  Pulse 86  Resp 16  Ht 5' 1.5" (1.562 m)  Wt 160 lb (72.576 kg)  BMI 29.75 kg/m2  Body mass index is 29.75 kg/(m^2).       Physical exam: Well-developed well-nourished white female in no acute distress Respirations are nonlabored Abdomen soft nontender no palpable aneurysm Pulse status palpable femoral pulses bilaterally. She has a 2+ right dorsalis pedis and 1+ left dorsalis pedis pulse Neurologically she is grossly intact  Noninvasive vascular studies were reviewed and discussed with the patient. This shows monophasic flow in the left foot with a ankle arm index of 0.76. On the right she has triphasic waveforms and ankle arm index of 1.0  I did review her CT angiogram for followup or stent graft in September 2014. This shows flow through the left limb of the stent graft but there is some suggestion of potential kinking at the distal end of the iliac limb.  Impression and plan progressive numbing claudication left leg. Suspect this may be due to the left limb of her stent graft. Have recommended arteriography for further evaluation and explained the high likelihood of being able to successfully treat this at the same setting with the stenting. She wishes to proceed as soon as possible we have scheduled this as an outpatient at [facility] on 03/06/2014

## 2014-03-06 NOTE — Interval H&P Note (Signed)
History and Physical Interval Note:  03/06/2014 9:59 AM  Megan [name]  has presented today for surgery, with the diagnosis of pvd  The various methods of treatment have been discussed with the patient and family. After consideration of risks, benefits and other options for treatment, the patient has consented to  Procedure(s): ABDOMINAL AORTAGRAM (N/A) as a surgical intervention .  The patient's history has been reviewed, patient examined, no change in status, stable for surgery.  I have reviewed the patient's chart and labs.  Questions were answered to the patient's satisfaction.     Mar Walmer

## 2014-03-07 ENCOUNTER — Other Ambulatory Visit: Payer: Self-pay | Admitting: *Deleted

## 2014-03-07 ENCOUNTER — Telehealth: Payer: Self-pay

## 2014-03-07 ENCOUNTER — Encounter (HOSPITAL_COMMUNITY): Payer: Self-pay | Admitting: Anesthesiology

## 2014-03-07 ENCOUNTER — Encounter (HOSPITAL_COMMUNITY): Admission: EM | Discharge status: Against Medical Advice | Payer: Self-pay | Source: Home / Self Care

## 2014-03-07 ENCOUNTER — Inpatient Hospital Stay: Admit: 2014-03-07 | Payer: Self-pay | Admitting: Surgery

## 2014-03-07 ENCOUNTER — Emergency Department (HOSPITAL_COMMUNITY)
Admission: EM | Admit: 2014-03-07 | Discharge: 2014-03-07 | Discharge status: Against Medical Advice | Payer: Medicare Other | Attending: Surgery | Admitting: Surgery

## 2014-03-07 ENCOUNTER — Encounter (HOSPITAL_COMMUNITY): Payer: Self-pay | Admitting: Emergency Medicine

## 2014-03-07 DIAGNOSIS — Z9889 Other specified postprocedural states: Secondary | ICD-10-CM | POA: Insufficient documentation

## 2014-03-07 DIAGNOSIS — Z951 Presence of aortocoronary bypass graft: Secondary | ICD-10-CM | POA: Insufficient documentation

## 2014-03-07 DIAGNOSIS — I8290 Acute embolism and thrombosis of unspecified vein: Secondary | ICD-10-CM

## 2014-03-07 DIAGNOSIS — I10 Essential (primary) hypertension: Secondary | ICD-10-CM | POA: Insufficient documentation

## 2014-03-07 DIAGNOSIS — Z4931 Encounter for adequacy testing for hemodialysis: Secondary | ICD-10-CM

## 2014-03-07 DIAGNOSIS — R209 Unspecified disturbances of skin sensation: Secondary | ICD-10-CM

## 2014-03-07 DIAGNOSIS — I2510 Atherosclerotic heart disease of native coronary artery without angina pectoris: Secondary | ICD-10-CM | POA: Insufficient documentation

## 2014-03-07 DIAGNOSIS — I739 Peripheral vascular disease, unspecified: Secondary | ICD-10-CM

## 2014-03-07 DIAGNOSIS — Z48812 Encounter for surgical aftercare following surgery on the circulatory system: Secondary | ICD-10-CM

## 2014-03-07 SURGERY — EMBOLECTOMY
Anesthesia: Choice | Laterality: Left

## 2014-03-07 NOTE — Discharge Instructions (Signed)
If you start to feel any numbness or loss of use of your hand, please call our office immediately [phone].  The office will call you with an appointment to follow up with [name] next week with a repeat arterial duplex.

## 2014-03-07 NOTE — ED Notes (Signed)
Pt transported to duplex scan

## 2014-03-07 NOTE — ED Notes (Signed)
Pt reports having cath done yesterday left AC and left groin. Today having numbness and cold sensation to left hand since yesterday. Reports not being able to palpate  Left radial pulse pta. On arrival to ed, pt has +left radial pulse and good cap refill.

## 2014-03-07 NOTE — Telephone Encounter (Signed)
Phone call from pt.  Reported she is unable to feel a pulse in the left arm, and the left hand feels colder compared to the right hand.  Instructed pt. to check nailbeds for capillary refill; reported nailbeds were delayed in getting pink, and only tips of nailbeds resumed color.  Discussed with Dr. Oneida Alar.  Recommended pt. go to the ER.  Notified Dr. Trula Slade, on call, via pager.   Advised pt. to go to the [facility] ER, and to not eat or drink anything further, until treatment plan is decided.  Verb. understanding.

## 2014-03-07 NOTE — Consult Note (Signed)
[facility] [place]  Patient name: Megan [name] MRN: [id number] DOB: 1946/09/30 Sex: female  REASON FOR CONSULT: Possible ischemic left hand  HPI: Megan [name] is a 68 y.o. female that presented with left lower extremity claudication. She had undergone previous EVAR in 2009. Doppler study showed monophasic Doppler signals in the left foot with an ABI of 76%. In September of 2014, CT scan showed flow through the left limb of her stent graft but possibly some potential kinking at the distal end of the left limb of her graft. She was set up for diagnostic arteriography.  She had an arteriogram yesterday by [name]. He attempted cannulation of the left groin but was unable to thread the wire. For this reason he did a left brachial approach. He placed a stent across the junction of the left limb of her graft and the left external iliac artery. She did well after the procedure and was discharged home.  Today she called the office complaining of not being able to feel her pulse in her left hand. She thought the left hand was cold. The office culture to come to the emergency department. Currently she has no significant pain or paresthesias in her left hand.  Past Medical History  Diagnosis Date  . Coronary artery disease     a. s/p CABG in 1995;  b. 05/2013 Neg MV, EF 82%;  c. 06/2013 Cath: LM nl, LAD 40-50p, LCX nl, RCA 80p/125m, VG->RCA->PDA->PLSA min irregs, LIMA->LAD atretic, vigorous LV fxn.  . Renal artery stenosis     a. 2008 s/p PTA  . Hypertension   . Hyperlipidemia   . Osteoarthritis   . Fibromyalgia   . Eczema, dyshidrotic   . GERD (gastroesophageal reflux disease)   . AAA (abdominal aortic aneurysm)     a. s/p stent grafting in 2009.  . Diverticulosis   . Colon polyp   . Peripheral arterial disease     left leg diagnosed by Dr Mare Ferrari    Family History  Problem Relation Age of Onset  . Heart disease Mother   . Heart disease Father   . Heart  disease Sister   . Diabetes Sister   . Heart disease Brother   . Diabetes Brother   . Colon cancer Paternal Grandmother   . Pancreatic cancer Neg Hx   . Rectal cancer Neg Hx   . Stomach cancer Neg Hx   . Esophageal cancer Neg Hx     SOCIAL HISTORY: History  Substance Use Topics  . Smoking status: Former Smoker    Types: Cigarettes    Quit date: 12/07/1995  . Smokeless tobacco: Never Used  . Alcohol Use: 1.2 oz/week    2 Glasses of wine per week     Comment: 8 drinks per wwek    Allergies  Allergen Reactions  . Bextra [Valdecoxib] Other (See Comments)    Increased lft's  . Lipitor [Atorvastatin] Other (See Comments)    Leg weakness  . Lisinopril Cough  . Metoprolol Other (See Comments)    Hair loss  . Mevacor [Lovastatin] Other (See Comments)    Muscle weakness  . Morphine And Related Nausea Only  . Plavix [Clopidogrel Bisulfate] Other (See Comments)    Itching of hands and feet  . Zocor [Simvastatin]     Bones hurt   . Codeine Itching and Rash  . Hydrocod Polst-Cpm Polst Er Rash    No current facility-administered medications for this encounter.   Current Outpatient  Prescriptions  Medication Sig Dispense Refill  . ALPRAZolam (XANAX) 0.25 MG tablet Take 0.25 mg by mouth daily as needed for anxiety.      [name] amLODipine (NORVASC) 10 MG tablet Take 1 tablet (10 mg total) by mouth daily.  30 tablet  11  . aspirin EC 325 MG tablet Take 325 mg by mouth at bedtime.      [name] atorvastatin (LIPITOR) 40 MG tablet Take 40 mg by mouth at bedtime.       . Cholecalciferol (VITAMIN D3) 5000 UNITS TABS Take 5,000 Units by mouth at bedtime. Take on Friday      . cycloSPORINE (RESTASIS) 0.05 % ophthalmic emulsion Place 1 drop into both eyes at bedtime.      . diclofenac sodium (VOLTAREN) 1 % GEL Apply 1 application topically 2 (two) times daily as needed (arthritis pain).      . fluticasone (FLONASE) 50 MCG/ACT nasal spray Place 2 sprays into both nostrils daily as needed (congestion).       [name] losartan-hydrochlorothiazide (HYZAAR) 100-12.5 MG per tablet Take 1 tablet by mouth daily.  30 tablet  11  . metFORMIN (GLUCOPHAGE) 500 MG tablet Take 500 mg by mouth 2 (two) times daily with a meal.      . nitroGLYCERIN (NITROSTAT) 0.4 MG SL tablet Place 0.4 mg under the tongue every 5 (five) minutes as needed for chest pain.      . Omega-3 Fatty Acids (FISH OIL) 1000 MG CAPS Take 2,000 mg by mouth 2 (two) times daily.      [name] omeprazole (PRILOSEC) 40 MG capsule Take 1 capsule (40 mg total) by mouth daily.  30 capsule  11  . potassium chloride (K-DUR) 10 MEQ tablet Take 20 mEq by mouth 2 (two) times daily.      [name] PRENATAL VITAMINS PO Take 1 tablet by mouth at bedtime.       . Probiotic Product (PROBIOTIC DAILY PO) Take 1 capsule by mouth daily.       . tapentadol (NUCYNTA) 50 MG TABS tablet Take 50 mg by mouth daily as needed (pain).      . torsemide (DEMADEX) 20 MG tablet Take 10 mg by mouth every other day.      . tretinoin (RETIN-A) 0.1 % cream Apply 1 application topically every other day.       REVIEW OF SYSTEMS: Valu.Nieves ] denotes positive finding; [  ] denotes negative finding CARDIOVASCULAR:  [ ]  chest pain   [ ]  chest pressure   [ ]  palpitations   [ ]  orthopnea   [ ]  dyspnea on exertion   [ ]  claudication   [ ]  rest pain   [ ]  DVT   [ ]  phlebitis PULMONARY:   [ ]  productive cough   [ ]  asthma   [ ]  wheezing NEUROLOGIC:   [ ]  weakness  [ ]  paresthesias  [ ]  aphasia  [ ]  amaurosis  [ ]  dizziness HEMATOLOGIC:   [ ]  bleeding problems   [ ]  clotting disorders MUSCULOSKELETAL:  [ ]  joint pain   [ ]  joint swelling [ ]  leg swelling GASTROINTESTINAL: [ ]   blood in stool  [ ]   hematemesis GENITOURINARY:  [ ]   dysuria  [ ]   hematuria PSYCHIATRIC:  [ ]  history of major depression INTEGUMENTARY:  [ ]  rashes  [ ]  ulcers CONSTITUTIONAL:  [ ]  fever   [ ]  chills  PHYSICAL EXAM: Filed Vitals:   03/07/14 1238  BP: 119/50  Pulse: 74  Temp: 97.7 F (36.5 C)  TempSrc: Oral  Resp: 22  Height:  5\' 1"  (1.549 m)  Weight: 159 lb (72.122 kg)  SpO2: 98%   Body mass index is 30.06 kg/(m^2). GENERAL: The patient is a well-nourished female, in no acute distress. The vital signs are documented above. CARDIOVASCULAR: There is a regular rate and rhythm. She has a palpable left radial pulse. PULMONARY: There is good air exchange bilaterally without wheezing or rales. MUSCULOSKELETAL: the left hand is warm. NEUROLOGIC: No focal weakness or paresthesias are detected. SKIN: There are no ulcers or rashes noted. PSYCHIATRIC: The patient has a normal affect.  DATA:  Lab Results  Component Value Date   WBC 7.4 06/11/2013   HGB 15.0 03/06/2014   HCT 44.0 03/06/2014   MCV 89.0 06/11/2013   PLT 150 06/11/2013   Lab Results  Component Value Date   NA 141 03/06/2014   K 3.4* 03/06/2014   CL 98 03/06/2014   CO2 28 02/08/2014   Lab Results  Component Value Date   CREATININE 0.90 03/06/2014   Lab Results  Component Value Date   INR 1.01 06/09/2013   INR 1.0 12/13/2007   INR 0.9 10/10/2007   Lab Results  Component Value Date   HGBA1C 6.3 02/08/2014   CBG (last 3)   Recent Labs  03/06/14 1525  GLUCAP 89   MEDICAL ISSUES:  The patient underwent brachial artery catheterization yesterday. She has a palpable left radial pulse. She has no motor or sensory deficit to suggest significant hematoma in the brachial artery. However to be complete, I will obtain a duplex scan of her left brachial artery. Assuming this looks normal we will plan on discharging her to home.  [facility] [place]: [phone]

## 2014-03-07 NOTE — ED Notes (Signed)
Leontine Locket vascular PA at bedside to consult with pt and explain plan of care

## 2014-03-07 NOTE — ED Notes (Signed)
Pt was not seen and/or evaluated by a EDP or PA from the ED staff, per Vascular physicians request. Vascular seen and evaluated pt with plan of care

## 2014-03-07 NOTE — Progress Notes (Signed)
*  PRELIMINARY RESULTS* Vascular Ultrasound Left upper extremity arterial duplex has been completed.  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 Monophasic flow distally.    Called report to Dr. Trula Slade who will see patient in ED.  Landry Mellow, RDMS, RVT  03/07/2014, 3:09 PM

## 2014-03-07 NOTE — ED Notes (Signed)
Pt seen by Dr. Doren Custard, orders placed as well as consult note.

## 2014-03-07 NOTE — ED Notes (Signed)
Pt presents with cool feeling and numbness to Left arm s/p cardiac cath yesterday.

## 2014-03-07 NOTE — Progress Notes (Signed)
Spoke with Dr. Scot Dock regarding arterial duplex results and relayed to pt that there is no clot or dissection of the artery.  She will follow up with [name] next week with an arterial duplex.  The office will call the pt to schedule the appointment.  She knows to call our office if she has any further problems with numbness or any motor issues.  She and her husband express understanding.  Leontine Locket 03/07/2014 3:52 PM

## 2014-03-07 NOTE — ED Notes (Addendum)
This RN spoke Katie in [place] in regards to pt, Megan [name] reports Dr. Trula Slade will see pt in the ED after he finishes his current case in the OR, EDP Zenia Resides informed

## 2014-03-07 NOTE — ED Notes (Addendum)
Vascular tech called to give report on pt, this RN requested the Vascular tech to call Katie in OR for further assistance due to our EDP will not be evaluating pt but Dr. Trula Slade will

## 2014-03-07 NOTE — ED Notes (Signed)
Pt discharged home with all belongings, pt alert, oriented, and ambulatory upon discharge, no new RX given, Leontine Locket Vascular PA seen pt and discharged home, pt verbalizes understanding of discharge instructions

## 2014-03-11 ENCOUNTER — Encounter: Payer: Self-pay | Admitting: Vascular Surgery

## 2014-03-11 ENCOUNTER — Encounter: Payer: Self-pay | Admitting: Family

## 2014-03-12 ENCOUNTER — Ambulatory Visit (HOSPITAL_COMMUNITY)
Admission: RE | Admit: 2014-03-12 | Discharge: 2014-03-12 | Disposition: A | Discharge status: Home / Self Care | Payer: Medicare Other | Source: Ambulatory Visit | Attending: Vascular Surgery | Admitting: Vascular Surgery

## 2014-03-12 ENCOUNTER — Ambulatory Visit (HOSPITAL_COMMUNITY): Admission: RE | Admit: 2014-03-12 | Payer: Medicare Other | Source: Ambulatory Visit

## 2014-03-12 ENCOUNTER — Encounter: Payer: Self-pay | Admitting: Vascular Surgery

## 2014-03-12 ENCOUNTER — Ambulatory Visit (INDEPENDENT_AMBULATORY_CARE_PROVIDER_SITE_OTHER): Payer: Medicare Other | Admitting: Vascular Surgery

## 2014-03-12 VITALS — BP 142/75 | HR 69 | Resp 18 | Ht 61.0 in | Wt 161.0 lb

## 2014-03-12 DIAGNOSIS — Z48812 Encounter for surgical aftercare following surgery on the circulatory system: Secondary | ICD-10-CM

## 2014-03-12 DIAGNOSIS — I739 Peripheral vascular disease, unspecified: Secondary | ICD-10-CM | POA: Insufficient documentation

## 2014-03-12 DIAGNOSIS — G459 Transient cerebral ischemic attack, unspecified: Secondary | ICD-10-CM | POA: Insufficient documentation

## 2014-03-12 DIAGNOSIS — I749 Embolism and thrombosis of unspecified artery: Secondary | ICD-10-CM

## 2014-03-12 DIAGNOSIS — I8290 Acute embolism and thrombosis of unspecified vein: Secondary | ICD-10-CM

## 2014-03-12 NOTE — Progress Notes (Signed)
Here today for followup of her recent stenting of left limb of her prior ureters and stent graft. She had developed change in morphology in the end of the stent was abutting next the common iliac artery with severe claudication throughout her left leg. This is a CT of the left groin and then was eventually successful through a left brachial approach. She did have some hematoma in her left arm but fortunately no evidence of ischemia in her left arm and no evidence of brachial sheath compression in the nerve impingement.  She reports that she has been on several walks and has had no claudication symptoms in her left leg She specifically denies any numbness or tingling in her left arm  On physical exam she does have 2-3+ left radial pulse. She does have bruising in medial arm above her antecubital space. No evidence of false aneurysm. She has 2+ dorsalis pedis pulses bilaterally. Her left groin puncture site shows no evidence of false aneurysm.  Left arm arterial study today I was reviewed with the patient. This shows normal triphasic radial and biphasic ulnar waveforms. There is some slight irregularity in the brachial artery distally with no hemodynamically significant flow changes.  Impression and plan stable status post treatment for her limitation of flow through the left limb of her stent graft repair of aneurysm. He said that very good result from this. We'll see [place] again in one year for a CT followup of her stent graft. She'll notify should she develop any ischemic symptoms in the interim

## 2014-03-13 NOTE — Addendum Note (Signed)
Addended by: Mena Goes on: 03/13/2014 09:38 AM   Modules accepted: Orders

## 2014-03-29 ENCOUNTER — Encounter: Payer: Self-pay | Admitting: Vascular Surgery

## 2014-03-29 ENCOUNTER — Ambulatory Visit (INDEPENDENT_AMBULATORY_CARE_PROVIDER_SITE_OTHER): Payer: Medicare Other | Admitting: Vascular Surgery

## 2014-03-29 ENCOUNTER — Ambulatory Visit (HOSPITAL_COMMUNITY)
Admission: RE | Admit: 2014-03-29 | Discharge: 2014-03-29 | Disposition: A | Discharge status: Home / Self Care | Payer: Medicare Other | Source: Ambulatory Visit | Attending: Vascular Surgery | Admitting: Vascular Surgery

## 2014-03-29 ENCOUNTER — Telehealth: Payer: Self-pay

## 2014-03-29 VITALS — BP 145/75 | HR 87 | Resp 16 | Ht 61.5 in | Wt 159.4 lb

## 2014-03-29 DIAGNOSIS — M7989 Other specified soft tissue disorders: Secondary | ICD-10-CM

## 2014-03-29 DIAGNOSIS — Z48812 Encounter for surgical aftercare following surgery on the circulatory system: Secondary | ICD-10-CM

## 2014-03-29 NOTE — Progress Notes (Signed)
Patient presents today with a concern regarding some swelling she's had in her left ankle. There is no pain associated with this. This is new to her and she was concerned. She is status post recent percutaneous angioplasty of the left limb of her stent graft for abdominal aortic aneurysm on 03/06/2014. She attempted access to the left groin that had subsequent treatment through the brachial approach. Initially after the procedure she did swelling and discomfort in her left arm. This has resolved. She has never had any neuro neuro vascular symptoms in her left hand. She is walking with no left leg claudication symptoms and this is completely resolved following the procedure.  Past Medical History  Diagnosis Date  . Coronary artery disease     a. s/p CABG in 1995;  b. 05/2013 Neg MV, EF 82%;  c. 06/2013 Cath: LM nl, LAD 40-50p, LCX nl, RCA 80p/171m, VG->RCA->PDA->PLSA min irregs, LIMA->LAD atretic, vigorous LV fxn.  . Renal artery stenosis     a. 2008 s/p PTA  . Hypertension   . Hyperlipidemia   . Osteoarthritis   . Fibromyalgia   . Eczema, dyshidrotic   . GERD (gastroesophageal reflux disease)   . AAA (abdominal aortic aneurysm)     a. s/p stent grafting in 2009.  . Diverticulosis   . Colon polyp   . Peripheral arterial disease     left leg diagnosed by Dr Mare Ferrari    History  Substance Use Topics  . Smoking status: Former Smoker    Types: Cigarettes    Quit date: 12/07/1995  . Smokeless tobacco: Never Used  . Alcohol Use: 1.2 oz/week    2 Glasses of wine per week     Comment: 8 drinks per wwek    Family History  Problem Relation Age of Onset  . Heart disease Mother   . Heart disease Father   . Heart disease Sister   . Diabetes Sister   . Heart disease Brother   . Diabetes Brother   . Colon cancer Paternal Grandmother   . Pancreatic cancer Neg Hx   . Rectal cancer Neg Hx   . Stomach cancer Neg Hx   . Esophageal cancer Neg Hx     Allergies  Allergen Reactions  . Bextra  [Valdecoxib] Other (See Comments)    Increased lft's  . Lipitor [Atorvastatin] Other (See Comments)    Leg weakness  . Lisinopril Cough  . Metoprolol Other (See Comments)    Hair loss  . Mevacor [Lovastatin] Other (See Comments)    Muscle weakness  . Morphine And Related Nausea Only  . Plavix [Clopidogrel Bisulfate] Other (See Comments)    Itching of hands and feet  . Zocor [Simvastatin]     Bones hurt   . Codeine Itching and Rash  . Hydrocod Polst-Cpm Polst Er Rash    Current outpatient prescriptions:ALPRAZolam (XANAX) 0.25 MG tablet, Take 0.25 mg by mouth daily as needed for anxiety., Disp: , Rfl: ;  amLODipine (NORVASC) 10 MG tablet, Take 1 tablet (10 mg total) by mouth daily., Disp: 30 tablet, Rfl: 11;  aspirin EC 325 MG tablet, Take 325 mg by mouth at bedtime., Disp: , Rfl: ;  atorvastatin (LIPITOR) 40 MG tablet, Take 40 mg by mouth at bedtime. , Disp: , Rfl:  Cholecalciferol (VITAMIN D3) 5000 UNITS TABS, Take 5,000 Units by mouth at bedtime. , Disp: , Rfl: ;  cycloSPORINE (RESTASIS) 0.05 % ophthalmic emulsion, Place 1 drop into both eyes at bedtime., Disp: , Rfl: ;  diclofenac sodium (VOLTAREN) 1 % GEL, Apply 1 application topically 2 (two) times daily as needed (arthritis pain)., Disp: , Rfl:  fluticasone (FLONASE) 50 MCG/ACT nasal spray, Place 2 sprays into both nostrils daily as needed (congestion)., Disp: , Rfl: ;  HYDROcodone-acetaminophen (NORCO) 10-325 MG per tablet, Take 1 tablet by mouth every 6 (six) hours as needed., Disp: , Rfl: ;  losartan-hydrochlorothiazide (HYZAAR) 100-12.5 MG per tablet, Take 1 tablet by mouth daily., Disp: 30 tablet, Rfl: 11 metFORMIN (GLUCOPHAGE) 500 MG tablet, Take 500 mg by mouth 2 (two) times daily with a meal., Disp: , Rfl: ;  nitroGLYCERIN (NITROSTAT) 0.4 MG SL tablet, Place 0.4 mg under the tongue every 5 (five) minutes as needed for chest pain., Disp: , Rfl: ;  Omega-3 Fatty Acids (FISH OIL) 1000 MG CAPS, Take 2,000 mg by mouth 2 (two) times  daily., Disp: , Rfl:  omeprazole (PRILOSEC) 40 MG capsule, Take 1 capsule (40 mg total) by mouth daily., Disp: 30 capsule, Rfl: 11;  potassium chloride (K-DUR) 10 MEQ tablet, Take 20 mEq by mouth 2 (two) times daily., Disp: , Rfl: ;  PRENATAL VITAMINS PO, Take 1 tablet by mouth at bedtime. , Disp: , Rfl: ;  Probiotic Product (PROBIOTIC DAILY PO), Take 1 capsule by mouth daily. , Disp: , Rfl:  torsemide (DEMADEX) 20 MG tablet, Take 10 mg by mouth every other day., Disp: , Rfl: ;  tretinoin (RETIN-A) 0.1 % cream, Apply 1 application topically every other day., Disp: , Rfl:   BP 145/75  Pulse 87  Resp 16  Ht 5' 1.5" (1.562 m)  Wt 159 lb 6.4 oz (72.303 kg)  BMI 29.63 kg/m2  Body mass index is 29.63 kg/(m^2).       On physical exam well-developed well-nourished white female no acute distress Left arm bruising is completely resolved and she has a 2+ left radial pulse Left groin without hematoma and without any evidence of false aneurysm or pulsatile mass. She does have 2+ dorsalis pedis pulse on the left Mild left ankle swelling compared to her right leg.  Venous duplex today with order to interpret myself and that shows no evidence of DVT. She does have reflux in her left femoral vein and left small saphenous vein  Impression and plan: No evidence of DVT on venous duplex. Did explain the significance of her reflux and this is of minimal significance. Certainly would not recommend any treatment. She will continue usual activities and will see [place] again in one year with repeat CT scan followup of her stent graft repair of abdominal aortic aneurysm

## 2014-03-29 NOTE — Telephone Encounter (Signed)
Phone call from pt.  Reports she has had intermittent swelling in the left foot up to just above ankle, since her aortogram of 03/06/14.  Denies any pain in left leg.  Denies any redness/ inflammation.  Reports that the swelling gets better after she has been in bed all night, but increases during day, with activity.  States her pulse feels strong in her left foot.  Denies any numbness or tingling.  Discussed with Dr. Bridgett Larsson.  Rec'd v.o. For venous duplex of left LE.  Will schedule for appt. With MD following duplex.  Appt. Given for 3:00 pm today.

## 2014-04-05 ENCOUNTER — Telehealth: Payer: Self-pay | Admitting: Cardiology

## 2014-04-05 DIAGNOSIS — IMO0001 Reserved for inherently not codable concepts without codable children: Secondary | ICD-10-CM

## 2014-04-05 DIAGNOSIS — E1165 Type 2 diabetes mellitus with hyperglycemia: Principal | ICD-10-CM

## 2014-04-05 DIAGNOSIS — E785 Hyperlipidemia, unspecified: Secondary | ICD-10-CM

## 2014-04-05 NOTE — Telephone Encounter (Signed)
New problem   Pt want to add to her labs in July A1C. Please advise pt.

## 2014-04-05 NOTE — Telephone Encounter (Signed)
Ordered labs as requested, patient aware

## 2014-05-18 ENCOUNTER — Other Ambulatory Visit: Payer: Self-pay | Admitting: Cardiology

## 2014-06-06 ENCOUNTER — Other Ambulatory Visit: Payer: Medicare Other

## 2014-06-10 ENCOUNTER — Other Ambulatory Visit: Payer: Self-pay | Admitting: Cardiology

## 2014-06-11 NOTE — Telephone Encounter (Signed)
Rx was sent to pharmacy electronically. 

## 2014-06-12 ENCOUNTER — Ambulatory Visit (INDEPENDENT_AMBULATORY_CARE_PROVIDER_SITE_OTHER): Payer: Medicare Other | Admitting: Cardiology

## 2014-06-12 ENCOUNTER — Other Ambulatory Visit: Payer: Self-pay | Admitting: Gastroenterology

## 2014-06-12 VITALS — BP 126/60 | HR 71 | Ht 61.5 in | Wt 155.0 lb

## 2014-06-12 DIAGNOSIS — I119 Hypertensive heart disease without heart failure: Secondary | ICD-10-CM

## 2014-06-12 DIAGNOSIS — I259 Chronic ischemic heart disease, unspecified: Secondary | ICD-10-CM

## 2014-06-12 DIAGNOSIS — I739 Peripheral vascular disease, unspecified: Secondary | ICD-10-CM

## 2014-06-12 DIAGNOSIS — E785 Hyperlipidemia, unspecified: Secondary | ICD-10-CM

## 2014-06-12 DIAGNOSIS — IMO0001 Reserved for inherently not codable concepts without codable children: Secondary | ICD-10-CM

## 2014-06-12 DIAGNOSIS — E1165 Type 2 diabetes mellitus with hyperglycemia: Secondary | ICD-10-CM

## 2014-06-12 NOTE — Patient Instructions (Signed)
Your physician recommends that you continue on your current medications as directed. Please refer to the Current Medication list given to you today.  Your physician wants you to follow-up in: 4 months with fasting labs (lp/bmet/hfp) You will receive a reminder letter in the mail two months in advance. If you don't receive a letter, please call our office to schedule the follow-up appointment.  

## 2014-06-12 NOTE — Assessment & Plan Note (Signed)
The patient has not had any recurrent chest pain or angina.  Her last cardiac catheterization was in 2014 showing grafts to be open.

## 2014-06-12 NOTE — Assessment & Plan Note (Signed)
Her blood pressures remained stable on current therapy.  No dizzy spells or syncope.  No chest pain or shortness of breath

## 2014-06-12 NOTE — Assessment & Plan Note (Signed)
Since last visit she has had surgery by Dr. early with improvement in her left leg claudication

## 2014-06-12 NOTE — Telephone Encounter (Signed)
NEEDS AN APPT

## 2014-06-12 NOTE — Assessment & Plan Note (Signed)
The patient has a history of hyperlipidemia.  She had recent lab work at her PCP office which was satisfactory.  Currently she was on Lipitor 40 mg a day her LDL was 76.  Her liver tests were mildly elevated.

## 2014-06-12 NOTE — Progress Notes (Signed)
Megan [name] Date of Birth:  08-09-1946 Megan [name] [address] [place], [place]  [address] [phone]        Fax   [phone]   History of Present Illness: This pleasant 68 year old woman is seen for a four-month followup office visit. She has a past history of essential hypertension and a past history of coronary artery disease. Also has a history of dyslipidemia. She is status post coronary artery bypass graft surgery in 1995. She had a renal artery stent for renal artery stenosis in 2008. She had a stent graft of an abdominal aortic aneurysm 2009. She has had chronic mild elevation of liver function enzymes secondary to modest alcohol intake and to fatty liver. In April 2013 she was hospitalized briefly [name] for dizziness and was found to be hypokalemic. She has been weaned off her Demadex and off for her potassium and has had no further episodes of dizziness. Her blood pressure is followed closely by her primary care provider. Since her last office visit she was hospitalized in July 2014 and had cardiac catheterization which showed that her grafts were open. There was no evidence of myocardial damage. She was subsequently sent to her gastroenterologist [name] who increased her Prilosec from 20 mg to 40 mg daily and her chest discomfort has resolved. She is now walking at the gym 3 days a week.   Current Outpatient Prescriptions  Medication Sig Dispense Refill  . ALPRAZolam (XANAX) 0.25 MG tablet Take 0.25 mg by mouth daily as needed for anxiety.      [name] amLODipine (NORVASC) 10 MG tablet Take 1 tablet (10 mg total) by mouth daily.  30 tablet  11  . aspirin EC 325 MG tablet Take 325 mg by mouth at bedtime.      [name] atorvastatin (LIPITOR) 40 MG tablet TAKE 1 TABLET (40 MG TOTAL) BY MOUTH DAILY.  30 tablet  5  . Cholecalciferol (VITAMIN D3) 5000 UNITS TABS Take 5,000 Units by mouth at bedtime.       . clobetasol cream (TEMOVATE) 0.05 %       . cycloSPORINE  (RESTASIS) 0.05 % ophthalmic emulsion Place 1 drop into both eyes at bedtime.      . diclofenac sodium (VOLTAREN) 1 % GEL Apply 1 application topically 2 (two) times daily as needed (arthritis pain).      . fluticasone (FLONASE) 50 MCG/ACT nasal spray Place 2 sprays into both nostrils daily as needed (congestion).      [name] losartan-hydrochlorothiazide (HYZAAR) 100-25 MG per tablet TAKE 1 TABLET BY MOUTH DAILY.  90 tablet  1  . metFORMIN (GLUCOPHAGE) 500 MG tablet Take 500 mg by mouth 2 (two) times daily with a meal.      . nitroGLYCERIN (NITROSTAT) 0.4 MG SL tablet Place 0.4 mg under the tongue every 5 (five) minutes as needed for chest pain.      . Omega-3 Fatty Acids (FISH OIL) 1000 MG CAPS Take 2,000 mg by mouth 2 (two) times daily.      [name] oxymorphone (OPANA) 5 MG tablet Take 5 mg by mouth every 8 (eight) hours as needed for pain.      . potassium chloride (K-DUR) 10 MEQ tablet Take 20 mEq by mouth 2 (two) times daily.      [name] PRENATAL VITAMINS PO Take 1 tablet by mouth at bedtime.       . Probiotic Product (PROBIOTIC DAILY PO) Take 1 capsule by mouth daily.       [name]  torsemide (DEMADEX) 20 MG tablet Take 10 mg by mouth every other day.      . tretinoin (RETIN-A) 0.1 % cream Apply 1 application topically every other day.      [name] omeprazole (PRILOSEC) 40 MG capsule TAKE 1 CAPSULE (40 MG TOTAL) BY MOUTH DAILY.  30 capsule  0   No current facility-administered medications for this visit.    Allergies  Allergen Reactions  . Anticoagulant Compound Itching and Other (See Comments)    Hands and feet tingle and itch.  Darlin Coco [Valdecoxib] Other (See Comments)    Increased lft's  . Doxycycline Diarrhea    N/v/d  . Lipitor [Atorvastatin] Other (See Comments)    Leg weakness  . Lisinopril Cough  . Metoprolol Other (See Comments)    Hair loss  . Mevacor [Lovastatin] Other (See Comments)    Muscle weakness  . Morphine And Related Nausea Only  . Plavix [Clopidogrel Bisulfate] Other (See Comments)      Itching of hands and feet  . Zocor [Simvastatin]     Bones hurt   . Codeine Itching and Rash    Hyper-active  . Hydrocod Polst-Cpm Polst Er Rash    Patient Active Problem List   Diagnosis Date Noted  . Benign hypertensive heart disease without heart failure 05/18/2011    Priority: High  . CAD (coronary artery disease) 05/18/2011    Priority: High  . Hyperlipidemia     Priority: High  . Swelling of limb 03/29/2014  . Aftercare following surgery of the circulatory system, [place] 03/29/2014  . Peripheral vascular disease, unspecified 03/12/2014  . Abdominal aneurysm without mention of rupture 02/19/2014  . Claudication of lower extremity 02/08/2014  . Type II or unspecified type diabetes mellitus without mention of complication, uncontrolled 02/08/2014  . Atherosclerosis of renal artery 09/04/2013  . Coronary artery disease   . Unstable angina 06/09/2013  . Peripheral vascular disease with claudication   . Abnormal LFTs (liver function tests) 09/19/2012  . Diverticulosis 09/19/2012  . Hypokalemia 03/23/2012  . History of abdominal aortic aneurysm repair 05/18/2011  . History of renal artery stenosis 05/18/2011    History  Smoking status  . Former Smoker  . Types: Cigarettes  . Quit date: 12/07/1995  Smokeless tobacco  . Never Used    History  Alcohol Use  . 1.2 oz/week  . 2 Glasses of wine per week    Comment: 8 drinks per wwek    Family History  Problem Relation Age of Onset  . Heart disease Mother   . Heart disease Father   . Heart disease Sister   . Diabetes Sister   . Heart disease Brother   . Diabetes Brother   . Colon cancer Paternal Grandmother   . Pancreatic cancer Neg Hx   . Rectal cancer Neg Hx   . Stomach cancer Neg Hx   . Esophageal cancer Neg Hx     Review of Systems: Constitutional: no fever chills diaphoresis or fatigue or change in weight.  Head and neck: no hearing loss, no epistaxis, no photophobia or visual disturbance. Respiratory:  No cough, shortness of breath or wheezing. Cardiovascular: No chest pain peripheral edema, palpitations. Gastrointestinal: No abdominal distention, no abdominal pain, no change in bowel habits hematochezia or melena. Genitourinary: No dysuria, no frequency, no urgency, no nocturia. Musculoskeletal:No arthralgias, no back pain, no gait disturbance or myalgias. Neurological: No dizziness, no headaches, no numbness, no seizures, no syncope, no weakness, no tremors. Hematologic: No lymphadenopathy, no easy bruising.  Psychiatric: No confusion, no hallucinations, no sleep disturbance.    Physical Exam: Filed Vitals:   06/12/14 0904  BP: 126/60  Pulse:    General appearance reveals a well-developed well-nourished woman in no distress.The head and neck exam reveals pupils equal and reactive.  Extraocular movements are full.  There is no scleral icterus.  The mouth and pharynx are normal.  The neck is supple.  The carotids reveal no bruits.  The jugular venous pressure is normal.  The  thyroid is not enlarged.  There is no lymphadenopathy.  The chest is clear to percussion and auscultation.  There are no rales or rhonchi.  Expansion of the chest is symmetrical.  The precordium is quiet.  The first heart sound is normal.  The second heart sound is physiologically split.  There is no murmur gallop rub or click.  There is no abnormal lift or heave.  The abdomen is soft and nontender.  The bowel sounds are normal.  The liver and spleen are not enlarged.  There are no abdominal masses.  There are no abdominal bruits.  Extremities reveal good pedal pulses.  There is no phlebitis or edema.  There is no cyanosis or clubbing.  Strength is normal and symmetrical in all extremities.  There is no lateralizing weakness.  There are no sensory deficits.  The skin is warm and dry.  There is no rash.  EKG shows normal sinus rhythm with nonspecific T-wave flattening and is unchanged since 06/09/13  Assessment / Plan: 1.   ischemic heart disease status post CABG in 1995 2. hypertensive heart disease 3. Hypercholesterolemia 4. abnormal liver function study 5. status post stent to right renal artery 2008 6. status post stent graft to abdominal aortic aneurysm 2009 7. peripheral artery disease 8. chronic low back pain followed by Dr. Nelva Bush 9. mild diabetes mellitus type 2  Plan: Continue same medication.  Recheck in 4 months for office visit lipid panel hepatic function panel basal metabolic panel and L9J

## 2014-06-18 ENCOUNTER — Other Ambulatory Visit: Payer: Self-pay

## 2014-06-18 DIAGNOSIS — Z1231 Encounter for screening mammogram for malignant neoplasm of breast: Secondary | ICD-10-CM

## 2014-06-21 ENCOUNTER — Telehealth: Payer: Self-pay | Admitting: Gastroenterology

## 2014-06-21 ENCOUNTER — Encounter: Payer: Self-pay | Admitting: Gastroenterology

## 2014-06-21 NOTE — Telephone Encounter (Signed)
Patient c/o brown residue "like I've been eating brownies" when she wakes in the am.  Patient states that she has a sore throat and occasional nausea. She denies any symptoms during the day.  She is taking prilosec 40 mg daily.  She feels her symptoms are from "something coming out of my stomach at night".  She is given an appt with [name] for 07/10/14. I have added her to the cancellation list.  Patient instructed to maintain an anti-reflux diet. Advised to avoid caffeine, mint, citrus foods/juices, tomatoes,  chocolate, NSAIDS/ASA products.  Instructed not to eat within 2 hours of exercise or bed, multiple small meals are better than 3 large meals.  Need to take PPI 30 minutes prior to 1st meal of the day and may take a second dose at HS.

## 2014-07-04 ENCOUNTER — Other Ambulatory Visit: Payer: Self-pay | Admitting: Dermatology

## 2014-07-10 ENCOUNTER — Ambulatory Visit (INDEPENDENT_AMBULATORY_CARE_PROVIDER_SITE_OTHER): Payer: Medicare Other | Admitting: Gastroenterology

## 2014-07-10 ENCOUNTER — Ambulatory Visit: Payer: Medicare Other | Admitting: Gastroenterology

## 2014-07-10 ENCOUNTER — Encounter: Payer: Self-pay | Admitting: Gastroenterology

## 2014-07-10 ENCOUNTER — Ambulatory Visit: Payer: Medicare Other

## 2014-07-10 VITALS — BP 126/60 | HR 72 | Ht 61.5 in | Wt 151.0 lb

## 2014-07-10 DIAGNOSIS — D132 Benign neoplasm of duodenum: Secondary | ICD-10-CM

## 2014-07-10 DIAGNOSIS — D1330 Benign neoplasm of unspecified part of small intestine: Secondary | ICD-10-CM

## 2014-07-10 DIAGNOSIS — Z86010 Personal history of colonic polyps: Secondary | ICD-10-CM

## 2014-07-10 DIAGNOSIS — K219 Gastro-esophageal reflux disease without esophagitis: Secondary | ICD-10-CM

## 2014-07-10 MED ORDER — PEG-KCL-NACL-NASULF-NA ASC-C 100 G PO SOLR: 1.0000 | Freq: Once | ORAL | Status: DC

## 2014-07-10 MED ORDER — OMEPRAZOLE 40 MG PO CPDR: 40.0000 mg | DELAYED_RELEASE_CAPSULE | Freq: Two times a day (BID) | ORAL | Status: DC

## 2014-07-10 NOTE — Patient Instructions (Signed)
Increase your omeprazole to 40 mg one tablet by mouth twice daily. A new prescription has been sent to your pharmacy.   You have been scheduled for an endoscopy and colonoscopy. Please follow the written instructions given to you at your visit today. Please pick up your prep at the pharmacy within the next 1-3 days. If you use inhalers (even only as needed), please bring them with you on the day of your procedure. Your physician has requested that you go to www.startemmi.com and enter the access code given to you at your visit today. This web site gives a general overview about your procedure. However, you should still follow specific instructions given to you by our office regarding your preparation for the procedure.  Thank you for choosing me and [facility].  Pricilla Riffle. Dagoberto Ligas., MD., Marval Regal  cc: Billey Chang, MD

## 2014-07-10 NOTE — Progress Notes (Signed)
    History of Present Illness: This is a 68 year old female who was noted problems with intermittent nausea, frequent belching and occasional brown coating on her tongue and mouth in the morning. She was concerned her symptoms are related to worsening acid reflux and she doubled her omeprazole dosage to twice daily and her symptoms resolved.  Current Medications, Allergies, Past Medical History, Past Surgical History, Family History and Social History were reviewed in [employer] record.  Physical Exam: General: Well developed , well nourished, no acute distress Head: Normocephalic and atraumatic Eyes:  sclerae anicteric, EOMI Ears: Normal auditory acuity Mouth: No deformity or lesions Lungs: Clear throughout to auscultation Heart: Regular rate and rhythm; no murmurs, rubs or bruits Abdomen: Soft, non tender and non distended. No masses, hepatosplenomegaly or hernias noted. Normal Bowel sounds Musculoskeletal: Symmetrical with no gross deformities  Pulses:  Normal pulses noted Extremities: No clubbing, cyanosis, edema or deformities noted Neurological: Alert oriented x 4, grossly nonfocal Psychological:  Alert and cooperative. Normal mood and affect   Assessment and Recommendations:   1. GERD. Continue omeprazole 40 mg bid and standard antireflux measures.   2. Duodenal adenoma removed in 08/2013. Follow up EGD at time of colonoscopy. The risks, benefits, and alternatives to endoscopy with possible biopsy and possible dilation were discussed with the patient and they consent to proceed.   3. Personal history of adenomatous colon polyps. Five-year surveillance colonoscopy due October 2015. The risks, benefits, and alternatives to colonoscopy with possible biopsy and possible polypectomy were discussed with the patient and they consent to proceed.   4. Diverticulosis. Long-term high fiber diet with adequate water intake.

## 2014-07-15 ENCOUNTER — Ambulatory Visit: Payer: Medicare Other

## 2014-07-16 ENCOUNTER — Ambulatory Visit
Admission: RE | Admit: 2014-07-16 | Discharge: 2014-07-16 | Disposition: A | Discharge status: Home / Self Care | Payer: Medicare Other | Source: Ambulatory Visit

## 2014-07-16 DIAGNOSIS — Z1231 Encounter for screening mammogram for malignant neoplasm of breast: Secondary | ICD-10-CM

## 2014-07-17 ENCOUNTER — Encounter: Payer: Self-pay | Admitting: Gastroenterology

## 2014-08-05 ENCOUNTER — Telehealth: Payer: Self-pay | Admitting: Cardiology

## 2014-08-05 NOTE — Telephone Encounter (Signed)
New message    patient calling changing one of her medication.

## 2014-08-05 NOTE — Telephone Encounter (Signed)
Left message to call back  

## 2014-08-05 NOTE — Telephone Encounter (Signed)
Advised patient

## 2014-08-05 NOTE — Telephone Encounter (Signed)
Yes, try decreasing the amlodipine to just half a tablet a day and see if the edema improves.

## 2014-08-05 NOTE — Telephone Encounter (Signed)
Patient stated that she had discussed with  Dr. Mare Ferrari at last ov that her left leg had been swelling some and that the Amlodipine could be causing. Since ov she has notice her right leg does swell but not as much as the left leg. She wanted to know if  Dr. Mare Ferrari wanted to decrease her Amlodipine to 1/2 tablet daily. Does have a blood pressure machine at home but does not check regularly. Will forward to  Dr. Mare Ferrari for review.

## 2014-09-10 ENCOUNTER — Other Ambulatory Visit: Payer: Self-pay | Admitting: Cardiology

## 2014-09-12 ENCOUNTER — Telehealth: Payer: Self-pay | Admitting: Cardiology

## 2014-09-12 ENCOUNTER — Other Ambulatory Visit: Payer: Self-pay

## 2014-09-12 MED ORDER — AMLODIPINE BESYLATE 5 MG PO TABS: 5.0000 mg | ORAL_TABLET | Freq: Every day | ORAL | Status: DC

## 2014-09-12 NOTE — Telephone Encounter (Signed)
Rx already sent to her pharmacy this am.

## 2014-09-12 NOTE — Telephone Encounter (Signed)
New message     Pt needs a new presc for amlodipine.  Her current presc is 10mg  but she cuts it in half and it crumbles.  She want a new presc for 5mg  called in to [facility].  If any problems, please call

## 2014-09-19 ENCOUNTER — Encounter: Payer: Self-pay | Admitting: Gastroenterology

## 2014-09-19 ENCOUNTER — Encounter: Payer: Medicare Other | Admitting: Gastroenterology

## 2014-09-19 ENCOUNTER — Ambulatory Visit (AMBULATORY_SURGERY_CENTER): Payer: Medicare Other | Admitting: Gastroenterology

## 2014-09-19 VITALS — BP 135/64 | HR 57 | Temp 96.1°F | Resp 20 | Ht 61.0 in | Wt 151.0 lb

## 2014-09-19 DIAGNOSIS — D128 Benign neoplasm of rectum: Secondary | ICD-10-CM

## 2014-09-19 DIAGNOSIS — Z860100 Personal history of colon polyps, unspecified: Secondary | ICD-10-CM

## 2014-09-19 DIAGNOSIS — K2980 Duodenitis without bleeding: Secondary | ICD-10-CM

## 2014-09-19 DIAGNOSIS — K635 Polyp of colon: Secondary | ICD-10-CM

## 2014-09-19 DIAGNOSIS — K3189 Other diseases of stomach and duodenum: Secondary | ICD-10-CM

## 2014-09-19 DIAGNOSIS — K219 Gastro-esophageal reflux disease without esophagitis: Secondary | ICD-10-CM

## 2014-09-19 DIAGNOSIS — D129 Benign neoplasm of anus and anal canal: Secondary | ICD-10-CM

## 2014-09-19 LAB — GLUCOSE, CAPILLARY
Glucose-Capillary: 107 mg/dL — ABNORMAL HIGH (ref 70–99)
Glucose-Capillary: 110 mg/dL — ABNORMAL HIGH (ref 70–99)

## 2014-09-19 MED ORDER — SODIUM CHLORIDE 0.9 % IV SOLN: 500.0000 mL | INTRAVENOUS | Status: DC

## 2014-09-19 NOTE — Patient Instructions (Signed)
YOU HAD AN ENDOSCOPIC PROCEDURE TODAY AT THE [facility]: Refer to the procedure report that was given to you for any specific questions about what was found during the examination.  If the procedure report does not answer your questions, please call your gastroenterologist to clarify.  If you requested that your care partner not be given the details of your procedure findings, then the procedure report has been included in a sealed envelope for you to review at your convenience later.  YOU SHOULD EXPECT: Some feelings of bloating in the abdomen. Passage of more gas than usual.  Walking can help get rid of the air that was put into your GI tract during the procedure and reduce the bloating. If you had a lower endoscopy (such as a colonoscopy or flexible sigmoidoscopy) you may notice spotting of blood in your stool or on the toilet paper. If you underwent a bowel prep for your procedure, then you may not have a normal bowel movement for a few days.  DIET: Your first meal following the procedure should be a light meal and then it is ok to progress to your normal diet.  A half-sandwich or bowl of soup is an example of a good first meal.  Heavy or fried foods are harder to digest and may make you feel nauseous or bloated.  Likewise meals heavy in dairy and vegetables can cause extra gas to form and this can also increase the bloating.  Drink plenty of fluids but you should avoid alcoholic beverages for 24 hours.  ACTIVITY: Your care partner should take you home directly after the procedure.  You should plan to take it easy, moving slowly for the rest of the day.  You can resume normal activity the day after the procedure however you should NOT DRIVE or use heavy machinery for 24 hours (because of the sedation medicines used during the test).    SYMPTOMS TO REPORT IMMEDIATELY: A gastroenterologist can be reached at any hour.  During normal business hours, 8:30 AM to 5:00 PM Monday through Friday,  call [phone].  After hours and on weekends, please call the GI answering service at [phone] who will take a message and have the physician on call contact you.   Following lower endoscopy (colonoscopy or flexible sigmoidoscopy):  Excessive amounts of blood in the stool  Significant tenderness or worsening of abdominal pains  Swelling of the abdomen that is new, acute  Fever of 100F or higher  Following upper endoscopy (EGD)  Vomiting of blood or coffee ground material  New chest pain or pain under the shoulder blades  Painful or persistently difficult swallowing  New shortness of breath  Fever of 100F or higher  Black, tarry-looking stools  FOLLOW UP: If any biopsies were taken you will be contacted by phone or by letter within the next 1-3 weeks.  Call your gastroenterologist if you have not heard about the biopsies in 3 weeks.  Our staff will call the home number listed on your records the next business day following your procedure to check on you and address any questions or concerns that you may have at that time regarding the information given to you following your procedure. This is a courtesy call and so if there is no answer at the home number and we have not heard from you through the emergency physician on call, we will assume that you have returned to your regular daily activities without incident.  SIGNATURES/CONFIDENTIALITY: You and/or your care   partner have signed paperwork which will be entered into your electronic medical record.  These signatures attest to the fact that that the information above on your After Visit Summary has been reviewed and is understood.  Full responsibility of the confidentiality of this discharge information lies with you and/or your care-partner.   Resume medications. Information given  on polyps,diverticulosis, Gastritis and high fiber diet with discharge instructions.

## 2014-09-19 NOTE — Op Note (Signed)
[place]  [address] [place], [address]   COLONOSCOPY PROCEDURE REPORT  PATIENT: Megan [name], Megan [name]  MR#: [id number] BIRTHDATE: Dec 23, 1945 , 68  yrs. old GENDER: female ENDOSCOPIST: Ladene Artist, MD, [facility] PROCEDURE DATE:  09/19/2014 PROCEDURE:   Colonoscopy with snare polypectomy First Screening Colonoscopy - Avg.  risk and is 50 yrs.  old or older - No.  Prior Negative Screening - Now for repeat screening. N/A  History of Adenoma - Now for follow-up colonoscopy & has been > or = to 3 yrs.  Yes hx of adenoma.  Has been 3 or more years since last colonoscopy.  Polyps Removed Today? Yes. ASA CLASS:   Class III INDICATIONS:surveillance colonoscopy based on a history of adenomatous colonic polyp(s). MEDICATIONS: Monitored anesthesia care and Propofol 250 mg IV DESCRIPTION OF PROCEDURE:   After the risks benefits and alternatives of the procedure were thoroughly explained, informed consent was obtained.  The digital rectal exam revealed no abnormalities of the rectum.   The LB [id number]  endoscope was introduced through the anus and advanced to the cecum, which was identified by both the appendix and ileocecal valve. No adverse events experienced.   The quality of the prep was good, using MoviPrep  The instrument was then slowly withdrawn as the colon was fully examined.  COLON FINDINGS: A sessile polyp measuring 6 mm in size was found in the rectum.  A polypectomy was performed with a cold snare.  The resection was complete, the polyp tissue was completely retrieved and sent to histology.   There was severe diverticulosis noted in the left colon with associated colonic spasm and muscular hypertrophy.   There was mild diverticulosis noted in the transverse colon and ascending colon.   The examination was otherwise normal.  Retroflexed views revealed no abnormalities. The time to cecum=3 minutes 27 seconds.  Withdrawal time=10 minutes 32 seconds.  The  scope was withdrawn and the procedure completed.  COMPLICATIONS: There were no immediate complications.  ENDOSCOPIC IMPRESSION: 1.   Sessile polyp in the rectum; polypectomy performed with a cold snare 2.   Severe diverticulosis in the left colon 3.   Mild diverticulosis in the transverse colon and ascending colon   RECOMMENDATIONS: 1.  Await pathology results 2.  High fiber diet with liberal fluid intake. 3.  Repeat Colonoscopy in 5 years.  eSigned:  Ladene Artist, MD, [facility] 09/19/2014 8:44 AM

## 2014-09-19 NOTE — Op Note (Signed)
[place]  [address] [place], [address]   ENDOSCOPY PROCEDURE REPORT  PATIENT: Megan [name], Megan [name]  MR#: [id number] BIRTHDATE: [birth year] , 68  yrs. old GENDER: female ENDOSCOPIST: Ladene Artist, MD, [facility] PROCEDURE DATE:  09/19/2014 PROCEDURE:  EGD w/ biopsy ASA CLASS:     Class III INDICATIONS:  history of esophageal reflux and follow up duodenal polyp. MEDICATIONS: Monitored anesthesia care, Residual sedation present, and Propofol 100 mg IV TOPICAL ANESTHETIC: none DESCRIPTION OF PROCEDURE: After the risks benefits and alternatives of the procedure were thoroughly explained, informed consent was obtained.  The LB [id number] endoscope was introduced through the mouth and advanced to the second portion of the duodenum , Without limitations.  The instrument was slowly withdrawn as the mucosa was fully examined.   DUODENUM: Multiple nodule(s) were located in the proximal and posterior bulb.  Multiple sampling biopsies were performed.   The site of the pior polypectomy appeared normal.  The second duodenum appeared normal. STOMACH: Gastritis was found in the gastric fundus and gastric body. Multiple biopsies were performed.   The stomach otherwise appeared normal. ESOPHAGUS: The mucosa of the esophagus appeared normal.  Retroflexed views revealed a small hiatal hernia.     The scope was then withdrawn from the patient and the procedure completed.  COMPLICATIONS: There were no immediate complications.  ENDOSCOPIC IMPRESSION: 1.   Nodules in the duodenal bulb; multiple sampling biopsies performed 2.   Gastritis in the gastric fundus and gastric body; multiple biopsies performed 3.   Small hiatal hernia  RECOMMENDATIONS: 1.  Anti-reflux regimen long term 2.  Continue PPI long term 3.  Await pathology results  eSigned:  Ladene Artist, MD, [facility] 09/19/2014 8:53 AM

## 2014-09-19 NOTE — Progress Notes (Signed)
Patient awakening,vss,report to rn 

## 2014-09-19 NOTE — Progress Notes (Signed)
Called to room to assist during endoscopic procedure.  Patient ID and intended procedure confirmed with present staff. Received instructions for my participation in the procedure from the performing physician.  

## 2014-09-20 ENCOUNTER — Telehealth: Payer: Self-pay | Admitting: *Deleted

## 2014-09-20 NOTE — Telephone Encounter (Signed)
  Follow up Call-  Call back number 09/19/2014 08/07/2013 07/24/2013  Post procedure Call Back phone  # [phone]  Permission to leave phone message Yes Yes Yes     Patient questions:  Message left to call us if necessary.

## 2014-09-25 ENCOUNTER — Encounter: Payer: Self-pay | Admitting: Gastroenterology

## 2014-10-03 ENCOUNTER — Other Ambulatory Visit: Payer: Self-pay | Admitting: Otolaryngology

## 2014-10-03 DIAGNOSIS — H9193 Unspecified hearing loss, bilateral: Secondary | ICD-10-CM

## 2014-10-08 ENCOUNTER — Other Ambulatory Visit: Payer: Self-pay | Admitting: Cardiology

## 2014-10-08 DIAGNOSIS — F419 Anxiety disorder, unspecified: Secondary | ICD-10-CM

## 2014-10-10 ENCOUNTER — Other Ambulatory Visit: Payer: Self-pay | Admitting: Cardiology

## 2014-10-10 DIAGNOSIS — F419 Anxiety disorder, unspecified: Secondary | ICD-10-CM

## 2014-10-10 NOTE — Telephone Encounter (Signed)
Pharmacy did not get ok done on 11/3

## 2014-10-12 ENCOUNTER — Other Ambulatory Visit: Payer: Medicare Other

## 2014-10-14 ENCOUNTER — Other Ambulatory Visit: Payer: Medicare Other

## 2014-10-16 ENCOUNTER — Ambulatory Visit
Admission: RE | Admit: 2014-10-16 | Discharge: 2014-10-16 | Disposition: A | Discharge status: Home / Self Care | Payer: Medicare Other | Source: Ambulatory Visit | Attending: Otolaryngology | Admitting: Otolaryngology

## 2014-10-16 DIAGNOSIS — H9193 Unspecified hearing loss, bilateral: Secondary | ICD-10-CM

## 2014-10-16 MED ORDER — GADOBENATE DIMEGLUMINE 529 MG/ML IV SOLN
15.0000 mL | Freq: Once | INTRAVENOUS | Status: AC | PRN
  Administered 2014-10-16: 15 mL via INTRAVENOUS

## 2014-11-04 ENCOUNTER — Telehealth: Payer: Self-pay | Admitting: Gastroenterology

## 2014-11-04 ENCOUNTER — Ambulatory Visit (INDEPENDENT_AMBULATORY_CARE_PROVIDER_SITE_OTHER): Payer: Medicare Other | Admitting: Nurse Practitioner

## 2014-11-04 ENCOUNTER — Encounter: Payer: Self-pay | Admitting: Nurse Practitioner

## 2014-11-04 VITALS — BP 142/66 | HR 76 | Ht 61.0 in | Wt 155.6 lb

## 2014-11-04 DIAGNOSIS — R1032 Left lower quadrant pain: Secondary | ICD-10-CM

## 2014-11-04 MED ORDER — AMOXICILLIN-POT CLAVULANATE 875-125 MG PO TABS: 1.0000 | ORAL_TABLET | Freq: Two times a day (BID) | ORAL | Status: DC

## 2014-11-04 NOTE — Progress Notes (Signed)
     History of Present Illness:  Patient is a 68 year old female known to [name]. She has a history of GERD, adenomatous duodenal polyp, adenomatous colon polyps and diverticulosis. Patient just had her surveillance EGD / colonoscopy mid October.   Patient is here with left-sided abdominal pain reminiscent of previous episodes of diverticulitis. No fever. Bowel movements are normal. No urinary symptoms.  No nausea or vomiting.  Patient has a history of diverticulitis, last episode about 1.5 years ago at which time her PCP referred her for surgical evaluation. Patient was seen by Dr. Lindon Romp with [facility]. I reviewed records in Care Everywhere. I'm unable to locate the actual CT scan but according to surgeon's note on 10/03/12, there were findings of extensive diverticulosis and mild inflammatory changes of the distal descending colon. After discussion with the surgeon a watch and wait approach was taken.   Current Medications, Allergies, Past Medical History, Past Surgical History, Family History and Social History were reviewed in [employer] record.  Physical Exam: General: Pleasant, well developed , white female in no acute distress Head: Normocephalic and atraumatic Eyes:  sclerae anicteric, conjunctiva pink  Ears: Normal auditory acuity Lungs: Clear throughout to auscultation Heart: Regular rate and rhythm Abdomen: Soft, non distended, moderate LLQ tenderness. . No masses, no hepatomegaly. Normal bowel sounds Musculoskeletal: Symmetrical with no gross deformities  Extremities: No edema  Neurological: Alert oriented x 4, grossly nonfocal Psychological:  Alert and cooperative. Normal mood and affect  Assessment and Recommendations:    68 year old female with two day history of left lower quadrant pain, probably recurrent diverticulitis. Previous episodes treated by PCP. Patient was evaluated by surgery in 2013, a watch and wait approach was taken and  she has done well until now. Patient is moderately tender on exam, otherwise looks okay . Will try Augmentin twice daily for 10 days. Recommended clear liquid until feeling better. Patient will call after completion of therapy to give [place] a condition update. She knows to call sooner if condition deteriorates.

## 2014-11-04 NOTE — Patient Instructions (Signed)
Start clear liquids until feeling better, information is below  We have sent the following medications to your pharmacy for you to pick up at your convenience: Augmentin 875  Call back when finished with antibiotic to give an update on how you are feeling  ________________________________________________________________________________________  Clear Liquid Diet A clear liquid diet is a short-term diet. It is made up of liquids or solids that become liquids at room temperature. You should be able to see through the liquid. WHAT CAN I HAVE? You may have any of the following if your doctor says they are okay:  Vegetable juices or fruit juices that do not have pulp.  Coffee.  Tea.  Soda.  Clear broth or strained broth-based soups.  High-protein gelatins and flavored gelatins.  Sugar and honey.  Ices or frozen ice pops that do not have milk. If you are not sure whether you can have a certain liquid, ask your doctor. You may also ask your doctor about other clear liquid options. Document Released: 11/04/2008 Document Revised: 11/27/2013 Document Reviewed: 10/19/2013 [facility] Patient Information 2015 [place], [place]. This information is not intended to replace advice given to you by your health care provider. Make sure you discuss any questions you have with your health care provider.

## 2014-11-04 NOTE — Telephone Encounter (Signed)
Patient will come in today and see Megan [name] RNP at 2:00

## 2014-11-05 ENCOUNTER — Encounter: Payer: Self-pay | Admitting: Nurse Practitioner

## 2014-11-05 ENCOUNTER — Telehealth: Payer: Self-pay | Admitting: Cardiology

## 2014-11-05 DIAGNOSIS — I159 Secondary hypertension, unspecified: Secondary | ICD-10-CM

## 2014-11-05 DIAGNOSIS — R1032 Left lower quadrant pain: Secondary | ICD-10-CM | POA: Insufficient documentation

## 2014-11-05 MED ORDER — HYDRALAZINE HCL 10 MG PO TABS: 10.0000 mg | ORAL_TABLET | Freq: Three times a day (TID) | ORAL | Status: DC

## 2014-11-05 NOTE — Progress Notes (Signed)
Reviewed and agree with management plan.  Donna Silverman T. Bevelyn Arriola, MD FACG 

## 2014-11-05 NOTE — Telephone Encounter (Signed)
New message     Talk to the nurse regarding her norvasc----her bp is still up and may need to change the dosage

## 2014-11-05 NOTE — Telephone Encounter (Signed)
Have her come in for a BMET soon.

## 2014-11-05 NOTE — Telephone Encounter (Signed)
Calling stating that Dr. Mare Ferrari decreased her Norvasc from 10 mg to 5 mg due to swelling in ankles in August.  States since then her BP has gradually been going up.  Over the last week has been 142/70 (today); 139/65; 140/69 and HR has ranged from 75-95.  States she would like to try a medication comparable to Norvasc but that doesn't cause swelling in her legs.  Advised that Dr. Mare Ferrari not in office today but will forward message to him for suggestions or recommendations.

## 2014-11-05 NOTE — Telephone Encounter (Signed)
Advised that Dr. Mare Ferrari responded to questions regarding K+.  He would like her to come in to check BMET.  She will come in Thursday 11/07/14 Order placed and linked.

## 2014-11-05 NOTE — Telephone Encounter (Signed)
Continue present amlodipine dose 5 mg daily.  Add hydralazine 10 mg TID for additional BP control. (If it works really well she may be able to get by on just BID dosing.)

## 2014-11-05 NOTE — Telephone Encounter (Signed)
Advised of Dr. Mare Ferrari response.  Wants to add Hydralazine 10 mg TID. Advised to continue to monitor BP and if this keeps BP under control may decrease to BID.  Will send Rx into pharmacy.  She states she is worried that K+ will get too low due to all the diuretics she is taking.  Last labs in chart are from 02/08/14 and K+ was 3.3 but she states she knows it has been checked since then.  She is scheduled for lab 12/09/14 and then to see Dr. Mare Ferrari 1/7. She states she knows when K+ is low because she gets leg cramps.  Advised will forward her concerns to Dr. Mare Ferrari.

## 2014-11-07 ENCOUNTER — Other Ambulatory Visit (INDEPENDENT_AMBULATORY_CARE_PROVIDER_SITE_OTHER): Payer: Medicare Other | Admitting: *Deleted

## 2014-11-07 DIAGNOSIS — I159 Secondary hypertension, unspecified: Secondary | ICD-10-CM

## 2014-11-07 LAB — BASIC METABOLIC PANEL
BUN: 5 mg/dL — ABNORMAL LOW (ref 6–23)
CO2: 25 mEq/L (ref 19–32)
Calcium: 9.6 mg/dL (ref 8.4–10.5)
Chloride: 105 mEq/L (ref 96–112)
Creatinine, Ser: 0.7 mg/dL (ref 0.4–1.2)
GFR: 87.88 mL/min (ref >60.00)
Glucose, Bld: 94 mg/dL (ref 70–99)
Potassium: 3.6 mEq/L (ref 3.5–5.1)
Sodium: 139 mEq/L (ref 135–145)

## 2014-11-08 ENCOUNTER — Other Ambulatory Visit: Payer: Medicare Other

## 2014-11-08 ENCOUNTER — Telehealth: Payer: Self-pay | Admitting: Nurse Practitioner

## 2014-11-08 ENCOUNTER — Other Ambulatory Visit: Payer: Self-pay | Admitting: *Deleted

## 2014-11-08 DIAGNOSIS — R197 Diarrhea, unspecified: Secondary | ICD-10-CM

## 2014-11-08 MED ORDER — CLOTRIMAZOLE 10 MG MT TROC: OROMUCOSAL | Status: DC

## 2014-11-08 MED ORDER — CIPROFLOXACIN HCL 250 MG PO TABS: ORAL_TABLET | ORAL | Status: DC

## 2014-11-08 MED ORDER — METRONIDAZOLE 250 MG PO TABS: ORAL_TABLET | ORAL | Status: DC

## 2014-11-08 NOTE — Telephone Encounter (Signed)
Spoke with Tye Savoy, NP Myclex trouche 1 tablet five times/day x 5 days. Patient notified and rx sent.

## 2014-11-08 NOTE — Telephone Encounter (Signed)
Patient calling to report she is having diarrhea 10-15/day since starting Augmentin on 11/04/14. Spoke with Tye Savoy, NP. Patient to have stool for c.diff, stop Augmentin. Start Flagyl 250 mg TID x 7 days and Cipro 250 mg BID x 7 days. Patient notified of recommendations. She will come for stool sample kit. Rx's sent to pharmacy.

## 2014-11-08 NOTE — Telephone Encounter (Signed)
Spoke with patient and she states her tongue is sore since taking antibiotics. She does see a yellow coating. She is asking for mouthwash for this. Please, advise.

## 2014-11-09 LAB — CLOSTRIDIUM DIFFICILE BY PCR: Toxigenic C. Difficile by PCR: NOT DETECTED

## 2014-11-11 ENCOUNTER — Other Ambulatory Visit: Payer: Self-pay | Admitting: *Deleted

## 2014-11-11 DIAGNOSIS — I119 Hypertensive heart disease without heart failure: Secondary | ICD-10-CM

## 2014-11-11 NOTE — Progress Notes (Signed)
Patient requested CBC be done with labs in January for PCP

## 2014-11-14 ENCOUNTER — Encounter (HOSPITAL_COMMUNITY): Payer: Self-pay | Admitting: Cardiovascular Disease

## 2014-11-15 ENCOUNTER — Encounter (HOSPITAL_BASED_OUTPATIENT_CLINIC_OR_DEPARTMENT_OTHER): Payer: Self-pay

## 2014-11-15 ENCOUNTER — Emergency Department (HOSPITAL_BASED_OUTPATIENT_CLINIC_OR_DEPARTMENT_OTHER): Payer: Medicare Other

## 2014-11-15 ENCOUNTER — Observation Stay (HOSPITAL_BASED_OUTPATIENT_CLINIC_OR_DEPARTMENT_OTHER)
Admission: EM | Admit: 2014-11-15 | Discharge: 2014-11-17 | Disposition: A | Discharge status: Home / Self Care | Payer: Medicare Other | Attending: Internal Medicine | Admitting: Internal Medicine

## 2014-11-15 DIAGNOSIS — E1151 Type 2 diabetes mellitus with diabetic peripheral angiopathy without gangrene: Secondary | ICD-10-CM

## 2014-11-15 DIAGNOSIS — I10 Essential (primary) hypertension: Secondary | ICD-10-CM | POA: Insufficient documentation

## 2014-11-15 DIAGNOSIS — E785 Hyperlipidemia, unspecified: Secondary | ICD-10-CM | POA: Insufficient documentation

## 2014-11-15 DIAGNOSIS — R079 Chest pain, unspecified: Principal | ICD-10-CM | POA: Insufficient documentation

## 2014-11-15 DIAGNOSIS — I739 Peripheral vascular disease, unspecified: Secondary | ICD-10-CM | POA: Diagnosis present

## 2014-11-15 DIAGNOSIS — E782 Mixed hyperlipidemia: Secondary | ICD-10-CM | POA: Diagnosis present

## 2014-11-15 DIAGNOSIS — E119 Type 2 diabetes mellitus without complications: Secondary | ICD-10-CM

## 2014-11-15 DIAGNOSIS — I119 Hypertensive heart disease without heart failure: Secondary | ICD-10-CM | POA: Diagnosis present

## 2014-11-15 LAB — CBC WITH DIFFERENTIAL/PLATELET
Basophils Absolute: 0 10*3/uL (ref 0.0–0.1)
Basophils Relative: 0 % (ref 0–1)
Eosinophils Absolute: 0.3 10*3/uL (ref 0.0–0.7)
Eosinophils Relative: 3 % (ref 0–5)
HCT: 45.1 % (ref 36.0–46.0)
Hemoglobin: 15.2 g/dL — ABNORMAL HIGH (ref 12.0–15.0)
Lymphocytes Relative: 32 % (ref 12–46)
Lymphs Abs: 3.4 10*3/uL (ref 0.7–4.0)
MCH: 29.1 pg (ref 26.0–34.0)
MCHC: 33.7 g/dL (ref 30.0–36.0)
MCV: 86.4 fL (ref 78.0–100.0)
Monocytes Absolute: 0.8 10*3/uL (ref 0.1–1.0)
Monocytes Relative: 7 % (ref 3–12)
Neutro Abs: 6.3 10*3/uL (ref 1.7–7.7)
Neutrophils Relative %: 58 % (ref 43–77)
Platelets: 223 10*3/uL (ref 150–400)
RBC: 5.22 MIL/uL — ABNORMAL HIGH (ref 3.87–5.11)
RDW: 13.3 % (ref 11.5–15.5)
WBC: 10.8 10*3/uL — ABNORMAL HIGH (ref 4.0–10.5)

## 2014-11-15 LAB — HEPATIC FUNCTION PANEL
ALT: 49 U/L — ABNORMAL HIGH (ref 0–35)
AST: 45 U/L — ABNORMAL HIGH (ref 0–37)
Albumin: 4 g/dL (ref 3.5–5.2)
Alkaline Phosphatase: 74 U/L (ref 39–117)
Bilirubin, Direct: <0.2 mg/dL (ref 0.0–0.3)
Total Bilirubin: 0.4 mg/dL (ref 0.3–1.2)
Total Protein: 7.2 g/dL (ref 6.0–8.3)

## 2014-11-15 LAB — BASIC METABOLIC PANEL
Anion gap: 15 (ref 5–15)
BUN: 11 mg/dL (ref 6–23)
CO2: 26 mEq/L (ref 19–32)
Calcium: 10.2 mg/dL (ref 8.4–10.5)
Chloride: 101 mEq/L (ref 96–112)
Creatinine, Ser: 0.9 mg/dL (ref 0.50–1.10)
GFR calc Af Amer: 73 mL/min — ABNORMAL LOW (ref >90)
GFR calc non Af Amer: 63 mL/min — ABNORMAL LOW (ref >90)
Glucose, Bld: 110 mg/dL — ABNORMAL HIGH (ref 70–99)
Potassium: 4.1 mEq/L (ref 3.7–5.3)
Sodium: 142 mEq/L (ref 137–147)

## 2014-11-15 LAB — LIPASE, BLOOD: Lipase: 40 U/L (ref 11–59)

## 2014-11-15 LAB — GLUCOSE, CAPILLARY: Glucose-Capillary: 112 mg/dL — ABNORMAL HIGH (ref 70–99)

## 2014-11-15 LAB — TROPONIN I: Troponin I: <0.3 ng/mL (ref <0.30)

## 2014-11-15 MED ORDER — MORPHINE SULFATE 2 MG/ML IJ SOLN
2.0000 mg | Freq: Once | INTRAMUSCULAR | Status: AC
Start: 2014-11-15 — End: 2014-11-15
  Administered 2014-11-15: 2 mg via INTRAVENOUS
  Filled 2014-11-15: qty 1

## 2014-11-15 MED ORDER — MORPHINE SULFATE 2 MG/ML IJ SOLN
INTRAMUSCULAR | Status: AC
  Administered 2014-11-15: 2 mg via INTRAVENOUS
  Filled 2014-11-15: qty 1

## 2014-11-15 MED ORDER — ASPIRIN 81 MG PO CHEW
324.0000 mg | CHEWABLE_TABLET | Freq: Once | ORAL | Status: AC
  Administered 2014-11-15: 324 mg via ORAL
  Filled 2014-11-15: qty 4

## 2014-11-15 MED ORDER — NITROGLYCERIN 0.4 MG SL SUBL
0.4000 mg | SUBLINGUAL_TABLET | SUBLINGUAL | Status: DC | PRN
  Administered 2014-11-15: 0.4 mg via SUBLINGUAL
  Filled 2014-11-15: qty 1

## 2014-11-15 MED ORDER — GI COCKTAIL ~~LOC~~
30.0000 mL | Freq: Once | ORAL | Status: AC
  Administered 2014-11-15: 30 mL via ORAL
  Filled 2014-11-15: qty 30

## 2014-11-15 NOTE — ED Notes (Signed)
C/o CP x 1.5 hrs

## 2014-11-15 NOTE — ED Provider Notes (Signed)
CSN: [id number]     Arrival date & time 11/15/14  2025 History  This chart was scribed for Megan Essex, MD by Randa Evens, ED Scribe. This patient was seen in room MHT14/MHT14 and the patient's care was started at 8:51 PM.     Chief Complaint  Patient presents with  . Chest Pain   Patient is a 68 y.o. female presenting with chest pain. The history is provided by the patient. No language interpreter was used.  Chest Pain Associated symptoms: no abdominal pain, no back pain, no diaphoresis, no dizziness and no shortness of breath    HPI Comments: Megan [name] is a 68 y.o. female with PMHx of CAD who presents to the Emergency Department complaining of aching constant central chest pain onset 2 hours prior. Pt states the pain is non-radiating. Pt states she took 1 nitroglycerin with no relief. Pt states that nothing makes the worse. Pt states she has Hx of triple bypass. States she has had similar pain 1 year ago, she states she was sent [facility] done which was normal. Denies SOB, diaphoresis, light headedness or dizziness. Denies back pain or abdominal pain.     Past Medical History  Diagnosis Date  . Coronary artery disease     a. s/p CABG in 1995;  b. 05/2013 Neg MV, EF 82%;  c. 06/2013 Cath: LM nl, LAD 40-50p, LCX nl, RCA 80p/114m, VG->RCA->PDA->PLSA min irregs, LIMA->LAD atretic, vigorous LV fxn.  . Renal artery stenosis     a. 2008 s/p PTA  . Hypertension   . Hyperlipidemia   . Osteoarthritis   . Fibromyalgia   . Eczema, dyshidrotic   . GERD (gastroesophageal reflux disease)   . AAA (abdominal aortic aneurysm)     a. s/p stent grafting in 2009.  . Diverticulosis   . Adenomatous colon polyp 05/2001  . Peripheral arterial disease     left leg diagnosed by Dr Mare Ferrari  . Adenomatous duodenal polyp    Past Surgical History  Procedure Laterality Date  . Renal artery stent  2008  . Coronary artery bypass graft  07/1994    Triple by Dr Ceasar Mons  . Carpal  tunnel release  08/1993    left wrist  . Tonsilectomy, adenoidectomy, bilateral myringotomy and tubes  1989  . Total abdominal hysterectomy  1992  . Carpal tunnel release  01/1997    right  . Thyroid cyst apiration  05/1997 and 07/1998  . Cardiovascular stress test  11/11/2009    normal study  . Cardiac catheterization    . Abdominal aortic aneurysm repair  2009    stenting  . Left heart catheterization with coronary/graft angiogram N/A 06/11/2013    Procedure: LEFT HEART CATHETERIZATION WITH [name];  Surgeon: Thayer Headings, MD;  Location: [facility] CATH LAB;  Service: Cardiovascular;  Laterality: N/A;  . Abdominal aortagram N/A 03/06/2014    Procedure: ABDOMINAL AORTAGRAM;  Surgeon: Rosetta Posner, MD;  Location: [facility] CATH LAB;  Service: Cardiovascular;  Laterality: N/A;   Family History  Problem Relation Age of Onset  . Heart disease Mother   . Heart disease Father   . Heart disease Sister   . Diabetes Sister   . Heart disease Brother   . Diabetes Brother   . Colon cancer Paternal Grandmother   . Pancreatic cancer Neg Hx   . Rectal cancer Neg Hx   . Stomach cancer Neg Hx   . Esophageal cancer Neg Hx  History  Substance Use Topics  . Smoking status: Former Smoker    Types: Cigarettes    Quit date: 12/07/1995  . Smokeless tobacco: Never Used  . Alcohol Use: 1.2 oz/week    2 Glasses of wine per week     Comment: 8 drinks per wwek   OB History    No data available     Review of Systems  Constitutional: Negative for diaphoresis.  Respiratory: Negative for shortness of breath.   Cardiovascular: Positive for chest pain.  Gastrointestinal: Negative for abdominal pain.  Musculoskeletal: Negative for back pain.  Neurological: Negative for dizziness and light-headedness.   A complete 10 system review of systems was obtained and all systems are negative except as noted in the HPI and PMH.     Allergies  Anticoagulant compound; Bextra; Doxycycline; Lipitor;  Lisinopril; Metoprolol; Mevacor; Morphine and related; Plavix; Zocor; Codeine; and Hydrocod polst-cpm polst er  Home Medications   Prior to Admission medications   Medication Sig Start Date End Date Taking? Authorizing Provider  ALPRAZolam Duanne Moron) 0.25 MG tablet TAKE 1 TABLET BY MOUTH DAILY AS NEEDED 10/11/14   Darlin Coco, MD  amLODipine (NORVASC) 5 MG tablet Take 1 tablet (5 mg total) by mouth daily. 09/12/14   Darlin Coco, MD  aspirin EC 325 MG tablet Take 325 mg by mouth at bedtime.    Historical Provider, MD  atorvastatin (LIPITOR) 40 MG tablet TAKE 1 TABLET (40 MG TOTAL) BY MOUTH DAILY.    Darlin Coco, MD  Cholecalciferol (VITAMIN D3) 5000 UNITS TABS Take 5,000 Units by mouth at bedtime.     Historical Provider, MD  ciprofloxacin (CIPRO) 250 MG tablet Take one po BID x 7 days 11/08/14   Willia Craze, NP  clobetasol cream (TEMOVATE) 0.05 % as needed.  06/11/14   Historical Provider, MD  clotrimazole (MYCELEX) 10 MG troche One by mouth five times daily x 5 days 11/08/14   Willia Craze, NP  cycloSPORINE (RESTASIS) 0.05 % ophthalmic emulsion Place 1 drop into both eyes at bedtime.    Historical Provider, MD  diclofenac sodium (VOLTAREN) 1 % GEL Apply 1 application topically 2 (two) times daily as needed (arthritis pain).    Historical Provider, MD  fluticasone (FLONASE) 50 MCG/ACT nasal spray Place 2 sprays into both nostrils daily as needed (congestion).    Historical Provider, MD  hydrALAZINE (APRESOLINE) 10 MG tablet Take 1 tablet (10 mg total) by mouth 3 (three) times daily. 11/05/14   Darlin Coco, MD  losartan-hydrochlorothiazide (HYZAAR) 100-25 MG per tablet TAKE 1 TABLET BY MOUTH DAILY. 09/11/14   Darlin Coco, MD  metFORMIN (GLUCOPHAGE) 500 MG tablet Take 500 mg by mouth 2 (two) times daily with a meal.    Historical Provider, MD  metroNIDAZOLE (FLAGYL) 250 MG tablet Take one po TID x 7 days 11/08/14   Willia Craze, NP  nitroGLYCERIN (NITROSTAT) 0.4 MG SL  tablet Place 0.4 mg under the tongue every 5 (five) minutes as needed for chest pain.    Historical Provider, MD  Omega-3 Fatty Acids (FISH OIL) 1000 MG CAPS Take 2,000 mg by mouth 2 (two) times daily.    Historical Provider, MD  omeprazole (PRILOSEC) 40 MG capsule Take 1 capsule (40 mg total) by mouth 2 (two) times daily. 07/10/14   Ladene Artist, MD  potassium chloride (K-DUR) 10 MEQ tablet Take 20 mEq by mouth 2 (two) times daily.    Historical Provider, MD  PRENATAL VITAMINS PO Take 1 tablet by  mouth at bedtime.     Historical Provider, MD  Probiotic Product (PROBIOTIC DAILY PO) Take 1 capsule by mouth daily.     Historical Provider, MD  torsemide (DEMADEX) 20 MG tablet Take 10 mg by mouth every other day.    Historical Provider, MD  tretinoin (RETIN-A) 0.1 % cream Apply 1 application topically every other day.    Historical Provider, MD   Triage vitals: BP 148/52 mmHg  Pulse 79  Temp(Src) 98.2 F (36.8 C) (Oral)  Resp 18  Ht 5\' 1"  (1.549 m)  Wt 153 lb (69.4 kg)  BMI 28.92 kg/m2  SpO2 98%  Physical Exam  Constitutional: She is oriented to person, place, and time. She appears well-developed and well-nourished. No distress.  HENT:  Head: Normocephalic and atraumatic.  Mouth/Throat: Oropharynx is clear and moist. No oropharyngeal exudate.  Eyes: Conjunctivae and EOM are normal. Pupils are equal, round, and reactive to light.  Neck: Normal range of motion. Neck supple.  No meningismus.  Cardiovascular: Normal rate, regular rhythm, normal heart sounds and intact distal pulses.   No murmur heard. Equal pulses.   Pulmonary/Chest: Effort normal and breath sounds normal. No respiratory distress. She exhibits tenderness.  Chest tender but does not reproduce patient's pain  Abdominal: Soft. There is no tenderness. There is no rebound and no guarding.  Musculoskeletal: Normal range of motion. She exhibits no edema or tenderness.  Neurological: She is alert and oriented to person, place, and  time. No cranial nerve deficit. She exhibits normal muscle tone. Coordination normal.  No ataxia on finger to nose bilaterally. No pronator drift. 5/5 strength throughout. CN 2-12 intact. Negative Romberg. Equal grip strength. Sensation intact. Gait is normal.   Skin: Skin is warm.  Psychiatric: She has a normal mood and affect. Her behavior is normal.  Nursing note and vitals reviewed.   ED Course  Procedures (including critical care time) DIAGNOSTIC STUDIES: Oxygen Saturation is 98% on RA, normal by my interpretation.    COORDINATION OF CARE: 9:02 PM-Discussed treatment plan with pt at bedside and pt agreed to plan.     Labs Review Labs Reviewed  CBC WITH DIFFERENTIAL - Abnormal; Notable for the following:    WBC 10.8 (*)    RBC 5.22 (*)    Hemoglobin 15.2 (*)    All other components within normal limits  BASIC METABOLIC PANEL - Abnormal; Notable for the following:    Glucose, Bld 110 (*)    GFR calc non Af Amer 63 (*)    GFR calc Af Amer 73 (*)    All other components within normal limits  HEPATIC FUNCTION PANEL - Abnormal; Notable for the following:    AST 45 (*)    ALT 49 (*)    All other components within normal limits  TROPONIN I  LIPASE, BLOOD    Imaging Review Dg Chest 2 View  11/15/2014   CLINICAL DATA:  Constant central chest pain for 2 hr. History of triple bypass. Similar symptoms 1 year ago.  EXAM: CHEST  2 VIEW  COMPARISON:  Chest radiograph June 09, 2013  FINDINGS: Cardiac silhouette is unremarkable. Mildly calcified aortic knob. Status post median sternotomy for coronary artery bypass grafting. The lungs are clear without pleural effusions or focal consolidations. Trachea projects midline and there is no pneumothorax. Soft tissue planes and included osseous structures are non-suspicious. Included view of the abdomen demonstrates aortoiliac stent graft.  IMPRESSION: No acute cardiopulmonary process; stable appearance of the chest from June 19, 2013  Electronically Signed   By: Elon Alas   On: 11/15/2014 21:31     EKG Interpretation   Date/Time:  Friday November 15 2014 20:46:16 EST Ventricular Rate:  76 PR Interval:  176 QRS Duration: 90 QT Interval:  386 QTC Calculation: 434 R Axis:   46 Text Interpretation:  Normal sinus rhythm Normal ECG No significant change  was found Confirmed by Wyvonnia Dusky  MD, Ariabella Brien [phone]) on 11/15/2014 9:01:57  PM      MDM   Final diagnoses:  Chest pain, unspecified chest pain type   left-sided chest pain onset at rest about 2 hours ago. Does not radiate. No shortness of breath, nausea or diaphoresis. History of coronary bypass, aortic aneurysm repair.  Patient received aspirin at home. No change with nitroglycerin. Troponin negative.  Low suspicion for dissection or pulmonary embolism. Patient states pain is similar to previous angina. Recent catheterization reviewed. Patent grafts in 2014.  Discussed with cardiology fellow Dr. Clayborne Artist.  he feels with negative enzymes and recent catheterization patient can be admitted to medicine. He will consult. Does not recommend heparin at this time.  D/w Dr. Roel Cluck.   patient given morphine and nitroglycerin with good relief of pain. EKG remains unchanged.   CRITICAL CARE Performed by: Megan [name] Total critical care time: 30 Critical care time was exclusive of separately billable procedures and treating other patients. Critical care was necessary to treat or prevent imminent or life-threatening deterioration. Critical care was time spent personally by me on the following activities: development of treatment plan with patient and/or surrogate as well as nursing, discussions with consultants, evaluation of patient's response to treatment, examination of patient, obtaining history from patient or surrogate, ordering and performing treatments and interventions, ordering and review of laboratory studies, ordering and review of radiographic studies,  pulse oximetry and re-evaluation of patient's condition.  I personally performed the services described in this documentation, which was scribed in my presence. The recorded information has been reviewed and is accurate.   Megan Essex, MD 11/15/14 [phone]

## 2014-11-16 ENCOUNTER — Encounter (HOSPITAL_COMMUNITY): Payer: Self-pay | Admitting: Internal Medicine

## 2014-11-16 DIAGNOSIS — E119 Type 2 diabetes mellitus without complications: Secondary | ICD-10-CM

## 2014-11-16 DIAGNOSIS — R079 Chest pain, unspecified: Secondary | ICD-10-CM

## 2014-11-16 DIAGNOSIS — I119 Hypertensive heart disease without heart failure: Secondary | ICD-10-CM

## 2014-11-16 DIAGNOSIS — I517 Cardiomegaly: Secondary | ICD-10-CM

## 2014-11-16 DIAGNOSIS — E1159 Type 2 diabetes mellitus with other circulatory complications: Secondary | ICD-10-CM

## 2014-11-16 DIAGNOSIS — E1151 Type 2 diabetes mellitus with diabetic peripheral angiopathy without gangrene: Secondary | ICD-10-CM

## 2014-11-16 DIAGNOSIS — E785 Hyperlipidemia, unspecified: Secondary | ICD-10-CM

## 2014-11-16 DIAGNOSIS — I739 Peripheral vascular disease, unspecified: Secondary | ICD-10-CM

## 2014-11-16 DIAGNOSIS — R0789 Other chest pain: Secondary | ICD-10-CM

## 2014-11-16 LAB — TSH: TSH: 2.95 u[IU]/mL (ref 0.350–4.500)

## 2014-11-16 LAB — CBC
HCT: 42.6 % (ref 36.0–46.0)
Hemoglobin: 14.1 g/dL (ref 12.0–15.0)
MCH: 28.5 pg (ref 26.0–34.0)
MCHC: 33.1 g/dL (ref 30.0–36.0)
MCV: 86.2 fL (ref 78.0–100.0)
Platelets: 208 10*3/uL (ref 150–400)
RBC: 4.94 MIL/uL (ref 3.87–5.11)
RDW: 13.2 % (ref 11.5–15.5)
WBC: 10.7 10*3/uL — ABNORMAL HIGH (ref 4.0–10.5)

## 2014-11-16 LAB — GLUCOSE, CAPILLARY
Glucose-Capillary: 84 mg/dL (ref 70–99)
Glucose-Capillary: 100 mg/dL — ABNORMAL HIGH (ref 70–99)
Glucose-Capillary: 123 mg/dL — ABNORMAL HIGH (ref 70–99)
Glucose-Capillary: 106 mg/dL — ABNORMAL HIGH (ref 70–99)

## 2014-11-16 LAB — TROPONIN I
Troponin I: <0.3 ng/mL (ref <0.30)
Troponin I: <0.3 ng/mL (ref <0.30)
Troponin I: <0.3 ng/mL (ref <0.30)

## 2014-11-16 LAB — MRSA PCR SCREENING: MRSA by PCR: NEGATIVE

## 2014-11-16 LAB — CREATININE, SERUM
Creatinine, Ser: 0.79 mg/dL (ref 0.50–1.10)
GFR calc Af Amer: >90 mL/min (ref >90)
GFR calc non Af Amer: 82 mL/min — ABNORMAL LOW (ref >90)

## 2014-11-16 MED ORDER — ATORVASTATIN CALCIUM 40 MG PO TABS
40.0000 mg | ORAL_TABLET | Freq: Every day | ORAL | Status: DC
  Administered 2014-11-16: 40 mg via ORAL
  Filled 2014-11-16: qty 1

## 2014-11-16 MED ORDER — CYCLOSPORINE 0.05 % OP EMUL
1.0000 [drp] | Freq: Every day | OPHTHALMIC | Status: DC
  Administered 2014-11-16 (×2): 1 [drp] via OPHTHALMIC
  Filled 2014-11-16 (×3): qty 1

## 2014-11-16 MED ORDER — VITAMIN D 1000 UNITS PO TABS
5000.0000 [IU] | ORAL_TABLET | Freq: Every day | ORAL | Status: DC
  Administered 2014-11-16 (×2): 5000 [IU] via ORAL
  Filled 2014-11-16 (×2): qty 5

## 2014-11-16 MED ORDER — HEPARIN SODIUM (PORCINE) 5000 UNIT/ML IJ SOLN
5000.0000 [IU] | Freq: Three times a day (TID) | INTRAMUSCULAR | Status: DC
  Administered 2014-11-16 – 2014-11-17 (×3): 5000 [IU] via SUBCUTANEOUS
  Filled 2014-11-16 (×2): qty 1

## 2014-11-16 MED ORDER — ONDANSETRON HCL 4 MG/2ML IJ SOLN
4.0000 mg | Freq: Four times a day (QID) | INTRAMUSCULAR | Status: DC | PRN
Start: 2014-11-16 — End: 2014-11-17

## 2014-11-16 MED ORDER — CLOTRIMAZOLE 10 MG MT TROC: 10.0000 mg | Freq: Every day | OROMUCOSAL | Status: DC

## 2014-11-16 MED ORDER — KETOROLAC TROMETHAMINE 30 MG/ML IJ SOLN
30.0000 mg | Freq: Four times a day (QID) | INTRAMUSCULAR | Status: DC | PRN
  Administered 2014-11-16 (×2): 30 mg via INTRAVENOUS
  Filled 2014-11-16 (×2): qty 1

## 2014-11-16 MED ORDER — HYDRALAZINE HCL 10 MG PO TABS
10.0000 mg | ORAL_TABLET | Freq: Three times a day (TID) | ORAL | Status: DC
  Administered 2014-11-16 – 2014-11-17 (×5): 10 mg via ORAL
  Filled 2014-11-16 (×5): qty 1

## 2014-11-16 MED ORDER — PANTOPRAZOLE SODIUM 40 MG PO TBEC
40.0000 mg | DELAYED_RELEASE_TABLET | Freq: Every day | ORAL | Status: DC
  Administered 2014-11-16 – 2014-11-17 (×2): 40 mg via ORAL
  Filled 2014-11-16 (×2): qty 1

## 2014-11-16 MED ORDER — HYDROCHLOROTHIAZIDE 25 MG PO TABS
25.0000 mg | ORAL_TABLET | Freq: Every day | ORAL | Status: DC
  Administered 2014-11-16 – 2014-11-17 (×2): 25 mg via ORAL
  Filled 2014-11-16 (×2): qty 1

## 2014-11-16 MED ORDER — INSULIN ASPART 100 UNIT/ML ~~LOC~~ SOLN: 0.0000 [IU] | Freq: Every day | SUBCUTANEOUS | Status: DC

## 2014-11-16 MED ORDER — ASPIRIN EC 325 MG PO TBEC
325.0000 mg | DELAYED_RELEASE_TABLET | Freq: Every day | ORAL | Status: DC
  Administered 2014-11-16: 325 mg via ORAL
  Filled 2014-11-16 (×2): qty 1

## 2014-11-16 MED ORDER — ONDANSETRON HCL 4 MG PO TABS: 4.0000 mg | ORAL_TABLET | Freq: Four times a day (QID) | ORAL | Status: DC | PRN

## 2014-11-16 MED ORDER — OMEGA-3-ACID ETHYL ESTERS 1 G PO CAPS
1.0000 g | ORAL_CAPSULE | Freq: Every day | ORAL | Status: DC
  Administered 2014-11-16 – 2014-11-17 (×2): 1 g via ORAL
  Filled 2014-11-16 (×2): qty 1

## 2014-11-16 MED ORDER — ALPRAZOLAM 0.25 MG PO TABS
0.2500 mg | ORAL_TABLET | Freq: Three times a day (TID) | ORAL | Status: DC | PRN
  Administered 2014-11-16 (×2): 0.25 mg via ORAL
  Filled 2014-11-16 (×2): qty 1

## 2014-11-16 MED ORDER — SODIUM CHLORIDE 0.9 % IJ SOLN
3.0000 mL | Freq: Two times a day (BID) | INTRAMUSCULAR | Status: DC
  Administered 2014-11-16 (×3): 3 mL via INTRAVENOUS

## 2014-11-16 MED ORDER — LOSARTAN POTASSIUM-HCTZ 100-25 MG PO TABS: 1.0000 | ORAL_TABLET | Freq: Every day | ORAL | Status: DC

## 2014-11-16 MED ORDER — INSULIN ASPART 100 UNIT/ML ~~LOC~~ SOLN: 0.0000 [IU] | Freq: Three times a day (TID) | SUBCUTANEOUS | Status: DC

## 2014-11-16 MED ORDER — MORPHINE SULFATE 2 MG/ML IJ SOLN: 2.0000 mg | INTRAMUSCULAR | Status: DC | PRN

## 2014-11-16 MED ORDER — POTASSIUM CHLORIDE CRYS ER 20 MEQ PO TBCR
20.0000 meq | EXTENDED_RELEASE_TABLET | Freq: Two times a day (BID) | ORAL | Status: DC
  Administered 2014-11-16 – 2014-11-17 (×4): 20 meq via ORAL
  Filled 2014-11-16 (×2): qty 1
  Filled 2014-11-16: qty 2
  Filled 2014-11-16: qty 1

## 2014-11-16 MED ORDER — LOSARTAN POTASSIUM 50 MG PO TABS
100.0000 mg | ORAL_TABLET | Freq: Every day | ORAL | Status: DC
  Administered 2014-11-16 – 2014-11-17 (×2): 100 mg via ORAL
  Filled 2014-11-16 (×2): qty 2

## 2014-11-16 MED ORDER — AMLODIPINE BESYLATE 5 MG PO TABS
5.0000 mg | ORAL_TABLET | Freq: Every day | ORAL | Status: DC
  Administered 2014-11-16 – 2014-11-17 (×2): 5 mg via ORAL
  Filled 2014-11-16 (×2): qty 1

## 2014-11-16 MED ORDER — RISAQUAD PO CAPS
ORAL_CAPSULE | Freq: Every day | ORAL | Status: DC
  Administered 2014-11-16 – 2014-11-17 (×2): 1 via ORAL
  Filled 2014-11-16 (×2): qty 1

## 2014-11-16 NOTE — H&P (Signed)
[facility] History and Physical  Megan [name] QQP:[id number] DOB: [birth year]    PCP:   [name], MD   Chief Complaint: chest pain at rest.   HPI: Megan [name] is an 68 y.o. female with hx of known CAD, s/p CABG about 20 years ago, hx of HTN, DM, PVD, hyperlipidemia, anxiety, RAS s/p PTA 2008, with recent catherization July 2014, showing normal LM, and non occlusive CAD, presented to [facility] with substernal chest pressure without accompanying SOB or diaphoresis.  She denied pleuritic component of her CP.  EKG showed no acute ST T changes, and her troponin was negative.  She did not respond to SL NTG and was having ongoing CP.  She has residual 3/10 chest pain.  Hospitalist was asked to admit her for cardiac r/out.  She was just coming of a course of Cipro and Flagyl for her diverticulitis, and this had improved as well.  Her CXR was clear.    Rewiew of Systems:  Constitutional: Negative for malaise, fever and chills. No significant weight loss or weight gain Eyes: Negative for eye pain, redness and discharge, diplopia, visual changes, or flashes of light. ENMT: Negative for ear pain, hoarseness, nasal congestion, sinus pressure and sore throat. No headaches; tinnitus, drooling, or problem swallowing. Cardiovascular: Negative for  palpitations, diaphoresis, dyspnea and peripheral edema. ; No orthopnea, PND Respiratory: Negative for cough, hemoptysis, wheezing and stridor. No pleuritic chestpain. Gastrointestinal: Negative for nausea, vomiting, diarrhea, constipation, abdominal pain, melena, blood in stool, hematemesis, jaundice and rectal bleeding.    Genitourinary: Negative for frequency, dysuria, incontinence,flank pain and hematuria; Musculoskeletal: Negative for back pain and neck pain. Negative for swelling and trauma.;  Skin: . Negative for pruritus, rash, abrasions, bruising and skin lesion.; ulcerations Neuro: Negative for headache, lightheadedness and neck stiffness.  Negative for weakness, altered level of consciousness , altered mental status, extremity weakness, burning feet, involuntary movement, seizure and syncope.  Psych: negative for anxiety, depression, insomnia, tearfulness, panic attacks, hallucinations, paranoia, suicidal or homicidal ideation    Past Medical History  Diagnosis Date  . Coronary artery disease     a. s/p CABG in 1995;  b. 05/2013 Neg MV, EF 82%;  c. 06/2013 Cath: LM nl, LAD 40-50p, LCX nl, RCA 80p/174m, VG->RCA->PDA->PLSA min irregs, LIMA->LAD atretic, vigorous LV fxn.  . Renal artery stenosis     a. 2008 s/p PTA  . Hypertension   . Hyperlipidemia   . Osteoarthritis   . Fibromyalgia   . Eczema, dyshidrotic   . GERD (gastroesophageal reflux disease)   . AAA (abdominal aortic aneurysm)     a. s/p stent grafting in 2009.  . Diverticulosis   . Adenomatous colon polyp 05/2001  . Peripheral arterial disease     left leg diagnosed by Dr Mare Ferrari  . Adenomatous duodenal polyp   . Myocardial infarction   . Anginal pain     Past Surgical History  Procedure Laterality Date  . Renal artery stent  2008  . Coronary artery bypass graft  07/1994    Triple by Dr Ceasar Mons  . Carpal tunnel release  08/1993    left wrist  . Tonsilectomy, adenoidectomy, bilateral myringotomy and tubes  1989  . Total abdominal hysterectomy  1992  . Carpal tunnel release  01/1997    right  . Thyroid cyst apiration  05/1997 and 07/1998  . Cardiovascular stress test  11/11/2009    normal study  . Cardiac catheterization    . Abdominal aortic aneurysm repair  2009    stenting  . Left heart catheterization with coronary/graft angiogram N/A 06/11/2013    Procedure: LEFT HEART CATHETERIZATION WITH [name];  Surgeon: Thayer Headings, MD;  Location: [facility] CATH LAB;  Service: Cardiovascular;  Laterality: N/A;  . Abdominal aortagram N/A 03/06/2014    Procedure: ABDOMINAL AORTAGRAM;  Surgeon: Rosetta Posner, MD;  Location: [facility] CATH LAB;  Service:  Cardiovascular;  Laterality: N/A;    Medications:  HOME MEDS: Prior to Admission medications   Medication Sig Start Date End Date Taking? Authorizing Provider  ALPRAZolam Duanne Moron) 0.25 MG tablet TAKE 1 TABLET BY MOUTH DAILY AS NEEDED 10/11/14   Darlin Coco, MD  amLODipine (NORVASC) 5 MG tablet Take 1 tablet (5 mg total) by mouth daily. 09/12/14   Darlin Coco, MD  aspirin EC 325 MG tablet Take 325 mg by mouth at bedtime.    Historical Provider, MD  atorvastatin (LIPITOR) 40 MG tablet TAKE 1 TABLET (40 MG TOTAL) BY MOUTH DAILY.    Darlin Coco, MD  Cholecalciferol (VITAMIN D3) 5000 UNITS TABS Take 5,000 Units by mouth at bedtime.     Historical Provider, MD  ciprofloxacin (CIPRO) 250 MG tablet Take one po BID x 7 days 11/08/14   Willia Craze, NP  clobetasol cream (TEMOVATE) 0.05 % as needed.  06/11/14   Historical Provider, MD  clotrimazole (MYCELEX) 10 MG troche One by mouth five times daily x 5 days 11/08/14   Willia Craze, NP  cycloSPORINE (RESTASIS) 0.05 % ophthalmic emulsion Place 1 drop into both eyes at bedtime.    Historical Provider, MD  diclofenac sodium (VOLTAREN) 1 % GEL Apply 1 application topically 2 (two) times daily as needed (arthritis pain).    Historical Provider, MD  fluticasone (FLONASE) 50 MCG/ACT nasal spray Place 2 sprays into both nostrils daily as needed (congestion).    Historical Provider, MD  hydrALAZINE (APRESOLINE) 10 MG tablet Take 1 tablet (10 mg total) by mouth 3 (three) times daily. 11/05/14   Darlin Coco, MD  losartan-hydrochlorothiazide (HYZAAR) 100-25 MG per tablet TAKE 1 TABLET BY MOUTH DAILY. 09/11/14   Darlin Coco, MD  metFORMIN (GLUCOPHAGE) 500 MG tablet Take 500 mg by mouth 2 (two) times daily with a meal.    Historical Provider, MD  metroNIDAZOLE (FLAGYL) 250 MG tablet Take one po TID x 7 days 11/08/14   Willia Craze, NP  nitroGLYCERIN (NITROSTAT) 0.4 MG SL tablet Place 0.4 mg under the tongue every 5 (five) minutes as needed  for chest pain.    Historical Provider, MD  Omega-3 Fatty Acids (FISH OIL) 1000 MG CAPS Take 2,000 mg by mouth 2 (two) times daily.    Historical Provider, MD  omeprazole (PRILOSEC) 40 MG capsule Take 1 capsule (40 mg total) by mouth 2 (two) times daily. 07/10/14   Ladene Artist, MD  potassium chloride (K-DUR) 10 MEQ tablet Take 20 mEq by mouth 2 (two) times daily.    Historical Provider, MD  PRENATAL VITAMINS PO Take 1 tablet by mouth at bedtime.     Historical Provider, MD  Probiotic Product (PROBIOTIC DAILY PO) Take 1 capsule by mouth daily.     Historical Provider, MD  torsemide (DEMADEX) 20 MG tablet Take 10 mg by mouth every other day.    Historical Provider, MD  tretinoin (RETIN-A) 0.1 % cream Apply 1 application topically every other day.    Historical Provider, MD     Allergies:  Allergies  Allergen Reactions  . Anticoagulant Compound Itching  and Other (See Comments)    Hands and feet tingle and itch.  Darlin Coco [Valdecoxib] Other (See Comments)    Increased lft's  . Doxycycline Diarrhea    N/v/d  . Lipitor [Atorvastatin] Other (See Comments)    Leg weakness  . Lisinopril Cough  . Metoprolol Other (See Comments)    Hair loss  . Mevacor [Lovastatin] Other (See Comments)    Muscle weakness  . Morphine And Related Nausea Only  . Plavix [Clopidogrel Bisulfate] Other (See Comments)    Itching of hands and feet  . Zocor [Simvastatin]     Bones hurt   . Codeine Itching and Rash    Hyper-active  . Hydrocod Polst-Cpm Polst Er Rash    Social History:   reports that she quit smoking about 18 years ago. Her smoking use included Cigarettes. She smoked 0.00 packs per day. She has never used smokeless tobacco. She reports that she drinks about 1.2 oz of alcohol per week. She reports that she does not use illicit drugs.  Family History: Family History  Problem Relation Age of Onset  . Heart disease Mother   . Heart disease Father   . Heart disease Sister   . Diabetes Sister    . Heart disease Brother   . Diabetes Brother   . Colon cancer Paternal Grandmother   . Pancreatic cancer Neg Hx   . Rectal cancer Neg Hx   . Stomach cancer Neg Hx   . Esophageal cancer Neg Hx      Physical Exam: Filed Vitals:   11/15/14 2130 11/15/14 2246 11/15/14 2331 11/15/14 2338  BP: 127/59 131/60  162/68  Pulse: 72 78  78  Temp:    98.6 F (37 C)  TempSrc:    Oral  Resp: 12 16 24 18   Height:   5\' 1"  (1.549 m)   Weight:   69.673 kg (153 lb 9.6 oz)   SpO2: 97% 99%  93%   Blood pressure 162/68, pulse 78, temperature 98.6 F (37 C), temperature source Oral, resp. rate 18, height 5\' 1"  (1.549 m), weight 69.673 kg (153 lb 9.6 oz), SpO2 93 %.  GEN:  Pleasant  patient lying in the stretcher in no acute distress; cooperative with exam. PSYCH:  alert and oriented x4; does not appear anxious or depressed; affect is appropriate. HEENT: Mucous membranes pink and anicteric; PERRLA; EOM intact; no cervical lymphadenopathy nor thyromegaly or carotid bruit; no JVD; There were no stridor. Neck is very supple. Breasts:: Not examined CHEST WALL: No tenderness CHEST: Normal respiration, clear to auscultation bilaterally.  HEART: Regular rate and rhythm.  There is a soft 2/6 SEM with no rub, or gallops.   BACK: No kyphosis or scoliosis; no CVA tenderness ABDOMEN: soft and non-tender; no masses, no organomegaly, normal abdominal bowel sounds; no pannus; no intertriginous candida. There is no rebound and no distention. Rectal Exam: Not done EXTREMITIES: No bone or joint deformity; age-appropriate arthropathy of the hands and knees; no edema; no ulcerations.  There is no calf tenderness. Genitalia: not examined PULSES: 2+ and symmetric SKIN: Normal hydration no rash or ulceration CNS: Cranial nerves 2-12 grossly intact no focal lateralizing neurologic deficit.  Speech is fluent; uvula elevated with phonation, facial symmetry and tongue midline. DTR are normal bilaterally, cerebella exam is  intact, barbinski is negative and strengths are equaled bilaterally.  No sensory loss.   Labs on Admission:  Basic Metabolic Panel:  Recent Labs Lab 11/15/14 2055  NA 142  K 4.1  CL 101  CO2 26  GLUCOSE 110*  BUN 11  CREATININE 0.90  CALCIUM 10.2   Liver Function Tests:  Recent Labs Lab 11/15/14 2102  AST 45*  ALT 49*  ALKPHOS 74  BILITOT 0.4  PROT 7.2  ALBUMIN 4.0    Recent Labs Lab 11/15/14 2102  LIPASE 40   No results for input(s): AMMONIA in the last 168 hours. CBC:  Recent Labs Lab 11/15/14 2055  WBC 10.8*  NEUTROABS 6.3  HGB 15.2*  HCT 45.1  MCV 86.4  PLT 223   Cardiac Enzymes:  Recent Labs Lab 11/15/14 2055  TROPONINI <0.30    CBG:  Recent Labs Lab 11/15/14 2338  GLUCAP 112*     Radiological Exams on Admission: Dg Chest 2 View  11/15/2014   CLINICAL DATA:  Constant central chest pain for 2 hr. History of triple bypass. Similar symptoms 1 year ago.  EXAM: CHEST  2 VIEW  COMPARISON:  Chest radiograph June 09, 2013  FINDINGS: Cardiac silhouette is unremarkable. Mildly calcified aortic knob. Status post median sternotomy for coronary artery bypass grafting. The lungs are clear without pleural effusions or focal consolidations. Trachea projects midline and there is no pneumothorax. Soft tissue planes and included osseous structures are non-suspicious. Included view of the abdomen demonstrates aortoiliac stent graft.  IMPRESSION: No acute cardiopulmonary process; stable appearance of the chest from June 19, 2013   Electronically Signed   By: Elon Alas   On: 11/15/2014 21:31    EKG: Independently reviewed. NSR with no acute ST T changes.   Assessment/Plan Present on Admission:  . Chest pain . Benign hypertensive heart disease without heart failure . Hyperlipidemia . Peripheral vascular disease with claudication . Chest pain at rest  PLAN:  Will admit her for atypical CP r/out.  Given her negative cath in July 2014, with atypical  CP features and negative EKG and negative initial troponin, I don't think she had ACS.  Will continue her meds including ASA, cycle more troponin, and obtain an ECHO.  For her DM, will hold her Metformin, and use SSI for coverage, placing her on carb modified diet.  For her hyperlipidemia, will continue with her statin.  She may need a nuclear stress test once she r/out.  Will give Toradol and morphine to control her pain at this time.  She is stable, full code, and will be admitted to hospitalist service.  Thank you for allowing me to participate in the care of your nice patient.   Other plans as per orders.  Code Status: FULL Haskel Khan, MD. [facility] Pager [phone] 7pm to 7am.  11/16/2014, 12:15 AM

## 2014-11-16 NOTE — Progress Notes (Signed)
  Echocardiogram 2D Echocardiogram has been performed.  Bobbye Charleston 11/16/2014, 2:41 PM

## 2014-11-16 NOTE — Consult Note (Signed)
Consulting cardiologist: Dr Carlyle Dolly MD  Clinical Summary Megan [name] is a 68 y.o.female gx if CAD with prior CABG reportedly 20 years ago, HTN, DM, HL admitted with chest pain. Episode yesterday around 630pm while at rest. 7/10 aching pain left chest, no SOB, palpitations, or diaphoresis. Lasted approx 3 hrs on and off. No relief with SL NG, did get some relief with IV morphine in ER.   - Cath 06/2013: LM patent, LAD 50% prox, LCX patent, RCA 80% prox followed by mid occlusion. LIMA-LAD small atretic with competitive flow from LAD, SVG-RCA patent. LVEF 70% - 11/16/14 echo LVEF 60%, mild LVH, no WMAs - Trop neg x5, K 4.1, Cr 0.9, Hgb 15.2, Plt 223, TSH 2.95,  CXR no acute process EKG no ischemic changes  Allergies  Allergen Reactions  . Anticoagulant Compound Itching and Other (See Comments)    Hands and feet tingle and itch.  Darlin Coco [Valdecoxib] Other (See Comments)    Increased lft's  . Doxycycline Diarrhea    N/v/d  . Lipitor [Atorvastatin] Other (See Comments)    Leg weakness  . Lisinopril Cough  . Metoprolol Other (See Comments)    Hair loss  . Mevacor [Lovastatin] Other (See Comments)    Muscle weakness  . Morphine And Related Nausea Only  . Plavix [Clopidogrel Bisulfate] Other (See Comments)    Itching of hands and feet  . Zocor [Simvastatin]     Bones hurt   . Codeine Itching and Rash    Hyper-active  . Hydrocod Polst-Cpm Polst Er Rash    Medications Scheduled Medications: . acidophilus   Oral Daily  . amLODipine  5 mg Oral Daily  . aspirin EC  325 mg Oral QHS  . atorvastatin  40 mg Oral q1800  . cholecalciferol  5,000 Units Oral QHS  . cycloSPORINE  1 drop Both Eyes QHS  . heparin  5,000 Units Subcutaneous 3 times per day  . hydrALAZINE  10 mg Oral TID  . losartan  100 mg Oral Daily   And  . hydrochlorothiazide  25 mg Oral Daily  . insulin aspart  0-15 Units Subcutaneous TID WC  . insulin aspart  0-5 Units Subcutaneous QHS  . omega-3 acid ethyl  esters  1 g Oral Daily  . pantoprazole  40 mg Oral Daily  . potassium chloride SA  20 mEq Oral BID  . sodium chloride  3 mL Intravenous Q12H     Infusions:     PRN Medications:  ALPRAZolam, morphine injection, ondansetron **OR** ondansetron (ZOFRAN) IV   Past Medical History  Diagnosis Date  . Coronary artery disease     a. s/p CABG in 1995;  b. 05/2013 Neg MV, EF 82%;  c. 06/2013 Cath: LM nl, LAD 40-50p, LCX nl, RCA 80p/152m, VG->RCA->PDA->PLSA min irregs, LIMA->LAD atretic, vigorous LV fxn.  . Renal artery stenosis     a. 2008 s/p PTA  . Hypertension   . Hyperlipidemia   . Osteoarthritis   . Fibromyalgia   . Eczema, dyshidrotic   . GERD (gastroesophageal reflux disease)   . AAA (abdominal aortic aneurysm)     a. s/p stent grafting in 2009.  . Diverticulosis   . Adenomatous colon polyp 05/2001  . Peripheral arterial disease     left leg diagnosed by Dr Mare Ferrari  . Adenomatous duodenal polyp   . Myocardial infarction   . Anginal pain     Past Surgical History  Procedure Laterality Date  . Renal artery stent  2008  . Coronary artery bypass graft  07/1994    Triple by Dr Ceasar Mons  . Carpal tunnel release  08/1993    left wrist  . Tonsilectomy, adenoidectomy, bilateral myringotomy and tubes  1989  . Total abdominal hysterectomy  1992  . Carpal tunnel release  01/1997    right  . Thyroid cyst apiration  05/1997 and 07/1998  . Cardiovascular stress test  11/11/2009    normal study  . Cardiac catheterization    . Abdominal aortic aneurysm repair  2009    stenting  . Left heart catheterization with coronary/graft angiogram N/A 06/11/2013    Procedure: LEFT HEART CATHETERIZATION WITH [name];  Surgeon: Thayer Headings, MD;  Location: [facility] CATH LAB;  Service: Cardiovascular;  Laterality: N/A;  . Abdominal aortagram N/A 03/06/2014    Procedure: ABDOMINAL AORTAGRAM;  Surgeon: Rosetta Posner, MD;  Location: [facility] CATH LAB;  Service: Cardiovascular;  Laterality: N/A;     Family History  Problem Relation Age of Onset  . Heart disease Mother   . Heart disease Father   . Heart disease Sister   . Diabetes Sister   . Heart disease Brother   . Diabetes Brother   . Colon cancer Paternal Grandmother   . Pancreatic cancer Neg Hx   . Rectal cancer Neg Hx   . Stomach cancer Neg Hx   . Esophageal cancer Neg Hx     Social History Megan [name] reports that she quit smoking about 18 years ago. Her smoking use included Cigarettes. She smoked 0.00 packs per day. She has never used smokeless tobacco. Megan [name] reports that she drinks about 1.2 oz of alcohol per week.  Review of Systems CONSTITUTIONAL: No weight loss, fever, chills, weakness or fatigue.  HEENT: Eyes: No visual loss, blurred vision, double vision or yellow sclerae. No hearing loss, sneezing, congestion, runny nose or sore throat.  SKIN: No rash or itching.  CARDIOVASCULAR: per HPI.  RESPIRATORY: No shortness of breath, cough or sputum.  GASTROINTESTINAL: No anorexia, nausea, vomiting or diarrhea. No abdominal pain or blood.  GENITOURINARY: no polyuria, no dysuria NEUROLOGICAL: No headache, dizziness, syncope, paralysis, ataxia, numbness or tingling in the extremities. No change in bowel or bladder control.  MUSCULOSKELETAL: No muscle, back pain, joint pain or stiffness.  HEMATOLOGIC: No anemia, bleeding or bruising.  LYMPHATICS: No enlarged nodes. No history of splenectomy.  PSYCHIATRIC: No history of depression or anxiety.      Physical Examination Blood pressure 132/46, pulse 58, temperature 97.8 F (36.6 C), temperature source Oral, resp. rate 20, height 5' 1.5" (1.562 m), weight 153 lb 9.5 oz (69.669 kg), SpO2 99 %.  Intake/Output Summary (Last 24 hours) at 11/16/14 1646 Last data filed at 11/16/14 0817  Gross per 24 hour  Intake    240 ml  Output    350 ml  Net   -110 ml    HEENT: Sclera claer  Cardiovascular: RRR, no m/r/g, no JVD  Respiratory:CTAB  GI: abdomen soft, NT,  ND  MSK: no LE edema  Neuro: no focal deficits  Psych: appropriate affect   Lab Results  Basic Metabolic Panel:  Recent Labs Lab 11/15/14 2055 11/16/14 0102  NA 142  --   K 4.1  --   CL 101  --   CO2 26  --   GLUCOSE 110*  --   BUN 11  --   CREATININE 0.90 0.79  CALCIUM 10.2  --     Liver Function Tests:  Recent Labs Lab 11/15/14 2102  AST 45*  ALT 49*  ALKPHOS 74  BILITOT 0.4  PROT 7.2  ALBUMIN 4.0    CBC:  Recent Labs Lab 11/15/14 2055 11/16/14 0102  WBC 10.8* 10.7*  NEUTROABS 6.3  --   HGB 15.2* 14.1  HCT 45.1 42.6  MCV 86.4 86.2  PLT 223 208    Cardiac Enzymes:  Recent Labs Lab 11/15/14 2055 11/16/14 0102 11/16/14 0619 11/16/14 1159  TROPONINI <0.30 <0.30 <0.30 <0.30    BNP: Invalid input(s): POCBNP   Impression/Recommendations 1. Chest pain - mixed symptoms for cardiac pain, she has known history of CAD - will obtain exercise cardiolite to better risk stratify. Make NPO tonight, order test for AM.    Carlyle Dolly, M.D

## 2014-11-16 NOTE — Progress Notes (Signed)
UR completed 

## 2014-11-16 NOTE — Progress Notes (Signed)
[facility] PROGRESS NOTE Assessment/Plan: Atypical Chest pain - Cardiac markers negative 3, EKG show normal sinus rhythm no T wave abnormalities. - She had a negative cath in July 2014, due to her multiple risk factors we'll consult cardiology for further stratification.  Peripheral vascular disease with claudication - ASA.  Benign hypertensive heart disease without heart failure - cont current home meds.  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  Hyperlipidemia: - Continue statin therapy.    Code Status: full Family Communication: none  Disposition Plan: inpatient   Consultants:  cardiology  Procedures:  CXR  Antibiotics:  None  HPI/Subjective: INtermittent chest pain  Objective: Filed Vitals:   11/16/14 0129 11/16/14 0400 11/16/14 0816 11/16/14 0959  BP:  117/46 130/48 130/48  Pulse: 69 68 67   Temp:  98.1 F (36.7 C) 98.1 F (36.7 C)   TempSrc:  Oral Oral   Resp: 29 20 20    Height:  5' 1.5" (1.562 m)    Weight:  69.669 kg (153 lb 9.5 oz)    SpO2: 93% 97% 98%     Intake/Output Summary (Last 24 hours) at 11/16/14 1110 Last data filed at 11/16/14 0817  Gross per 24 hour  Intake    240 ml  Output    350 ml  Net   -110 ml   Filed Weights   11/15/14 2041 11/15/14 2331 11/16/14 0400  Weight: 69.4 kg (153 lb) 69.673 kg (153 lb 9.6 oz) 69.669 kg (153 lb 9.5 oz)    Exam:  General: Alert, awake, oriented x3, in no acute distress.  HEENT: No bruits, no goiter.  Heart: Regular rate and rhythm. Lungs: Good air movement,clear Abdomen: Soft, nontender, nondistended, positive bowel sounds.    Data Reviewed: Basic Metabolic Panel:  Recent Labs Lab 11/15/14 2055 11/16/14 0102  NA 142  --   K 4.1  --   CL 101  --   CO2 26  --   GLUCOSE 110*  --   BUN 11  --   CREATININE 0.90 0.79  CALCIUM 10.2  --    Liver Function  Tests:  Recent Labs Lab 11/15/14 2102  AST 45*  ALT 49*  ALKPHOS 74  BILITOT 0.4  PROT 7.2  ALBUMIN 4.0    Recent Labs Lab 11/15/14 2102  LIPASE 40   No results for input(s): AMMONIA in the last 168 hours. CBC:  Recent Labs Lab 11/15/14 2055 11/16/14 0102  WBC 10.8* 10.7*  NEUTROABS 6.3  --   HGB 15.2* 14.1  HCT 45.1 42.6  MCV 86.4 86.2  PLT 223 208   Cardiac Enzymes:  Recent Labs Lab 11/15/14 2055 11/16/14 0102 11/16/14 0619  TROPONINI <0.30 <0.30 <0.30   BNP (last 3 results) No results for input(s): PROBNP in the last 8760 hours. CBG:  Recent Labs Lab 11/15/14 2338 11/16/14 0730  GLUCAP 112* 84    Recent Results (from the past 240 hour(s))  Clostridium Difficile by PCR     Status: None   Collection Time: 11/08/14  2:18 PM  Result Value Ref Range Status   C difficile by pcr Not Detected Not Detected Final    Comment: This test is for use only with liquid or soft stools; performance characteristics of other clinical specimen types have not been established.   This assay was performed by Cepheid GeneXpert(R) PCR. The performance characteristics of this assay have been determined by  [employer]. Performance characteristics refer to the analytical performance of the test.   MRSA PCR Screening     Status: None   Collection Time: 11/15/14 11:29 PM  Result Value Ref Range Status   MRSA by PCR NEGATIVE NEGATIVE Final    Comment:        The GeneXpert MRSA Assay (FDA approved for NASAL specimens only), is one component of a comprehensive MRSA colonization surveillance program. It is not intended to diagnose MRSA infection nor to guide or monitor treatment for MRSA infections.      Studies: Dg Chest 2 View  11/15/2014   CLINICAL DATA:  Constant central chest pain for 2 hr. History of triple bypass. Similar symptoms 1 year ago.  EXAM: CHEST  2 VIEW  COMPARISON:  Chest radiograph June 09, 2013  FINDINGS: Cardiac silhouette is  unremarkable. Mildly calcified aortic knob. Status post median sternotomy for coronary artery bypass grafting. The lungs are clear without pleural effusions or focal consolidations. Trachea projects midline and there is no pneumothorax. Soft tissue planes and included osseous structures are non-suspicious. Included view of the abdomen demonstrates aortoiliac stent graft.  IMPRESSION: No acute cardiopulmonary process; stable appearance of the chest from June 19, 2013   Electronically Signed   By: Elon Alas   On: 11/15/2014 21:31    Scheduled Meds: . acidophilus   Oral Daily  . amLODipine  5 mg Oral Daily  . aspirin EC  325 mg Oral QHS  . atorvastatin  40 mg Oral q1800  . cholecalciferol  5,000 Units Oral QHS  . cycloSPORINE  1 drop Both Eyes QHS  . heparin  5,000 Units Subcutaneous 3 times per day  . hydrALAZINE  10 mg Oral TID  . losartan  100 mg Oral Daily   And  . hydrochlorothiazide  25 mg Oral Daily  . insulin aspart  0-15 Units Subcutaneous TID WC  . insulin aspart  0-5 Units Subcutaneous QHS  . omega-3 acid ethyl esters  1 g Oral Daily  . pantoprazole  40 mg Oral Daily  . potassium chloride SA  20 mEq Oral BID  . sodium chloride  3 mL Intravenous Q12H   Continuous Infusions:    Charlynne Cousins  [facility] Pager [phone]. If 8PM-8AM, please contact night-coverage at [email or web address], password [facility] 11/16/2014, 11:10 AM  LOS: 1 day

## 2014-11-17 ENCOUNTER — Observation Stay (HOSPITAL_COMMUNITY): Payer: Medicare Other

## 2014-11-17 DIAGNOSIS — R079 Chest pain, unspecified: Secondary | ICD-10-CM

## 2014-11-17 LAB — GLUCOSE, CAPILLARY
Glucose-Capillary: 108 mg/dL — ABNORMAL HIGH (ref 70–99)
Glucose-Capillary: 103 mg/dL — ABNORMAL HIGH (ref 70–99)

## 2014-11-17 MED ORDER — TECHNETIUM TC 99M SESTAMIBI GENERIC - CARDIOLITE
10.0000 | Freq: Once | INTRAVENOUS | Status: AC | PRN
  Administered 2014-11-17: 10 via INTRAVENOUS

## 2014-11-17 MED ORDER — ACETAMINOPHEN 325 MG PO TABS: 650.0000 mg | ORAL_TABLET | Freq: Once | ORAL | Status: DC

## 2014-11-17 MED ORDER — TECHNETIUM TC 99M SESTAMIBI - CARDIOLITE
30.0000 | Freq: Once | INTRAVENOUS | Status: AC | PRN
  Administered 2014-11-17: 30 via INTRAVENOUS

## 2014-11-17 NOTE — Discharge Summary (Signed)
Physician Discharge Summary  Megan [name] RKY:[id number] DOB: 25-Sep-1946 DOA: 11/15/2014  PCP: [name], MD  Admit date: 11/15/2014 Discharge date: 11/17/2014  Time spent: 35 minutes  Recommendations for Outpatient Follow-up:  1. Follow up with PCP  Discharge Diagnoses:  Principal Problem:   Chest pain Active Problems:   Benign hypertensive heart disease without heart failure   Hyperlipidemia   Peripheral vascular disease with claudication   Chest pain at rest   DM (diabetes mellitus), type 2 with peripheral vascular complications   Pain in the chest   Discharge Condition: stable  Diet recommendation: heart healthy  Filed Weights   11/15/14 2331 11/16/14 0400 11/17/14 0400  Weight: 69.673 kg (153 lb 9.6 oz) 69.669 kg (153 lb 9.5 oz) 69.945 kg (154 lb 3.2 oz)    History of present illness:  68 y.o. female with hx of known CAD, s/p CABG about 20 years ago, hx of HTN, DM, PVD, hyperlipidemia, anxiety, RAS s/p PTA 2008, with recent catherization July 2014, showing normal LM, and non occlusive CAD, presented to [facility] with substernal chest pressure without accompanying SOB or diaphoresis. She denied pleuritic component of her CP. EKG showed no acute ST T changes, and her troponin was negative. She did not respond to SL NTG and was having ongoing CP. She has residual 3/10 chest pain. Hospitalist was asked to admit her for cardiac r/out. She was just coming of a course of Cipro and Flagyl for her diverticulitis, and this had improved as well.   Hospital Course:  Atypical Chest pain - Cardiac markers negative 3, EKG show normal sinus rhythm no T wave abnormalities. - She had a negative cath in July 2014, due to her multiple risk factors we'll consult cardiology for further stratification. - Stress test was negative for reversible ischemia.  Peripheral vascular disease with claudication - ASA.  Benign hypertensive heart disease without heart failure - cont current  home meds.  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  Hyperlipidemia: - Continue statin therapy.    Procedures: Exercise stress test: No reversible ischemia or infarction.  Normal left ventricular wall motion. Left ventricular ejection fraction 86% 4. Low-risk stress test findings  Consultations:  cardiology  Discharge Exam: Filed Vitals:   11/17/14 1203  BP: 137/75  Pulse: 83  Temp: 98 F (36.7 C)  Resp: 20    General: A&o x3 Cardiovascular: RRR Respiratory: good air movement CTA B/L  Discharge Instructions You were cared for by a hospitalist during your hospital stay. If you have any questions about your discharge medications or the care you received while you were in the hospital after you are discharged, you can call the unit and asked to speak with the hospitalist on call if the hospitalist that took care of you is not available. Once you are discharged, your primary care physician will handle any further medical issues. Please note that NO REFILLS for any discharge medications will be authorized once you are discharged, as it is imperative that you return to your primary care physician (or establish a relationship with a primary care physician if you do not have one) for your aftercare needs so that they can reassess your need for medications and monitor your lab values.  Discharge Instructions    Diet - low sodium heart healthy    Complete by:  As directed      Increase activity slowly  Complete by:  As directed           Current Discharge Medication List    CONTINUE these medications which have NOT CHANGED   Details  ALPRAZolam (XANAX) 0.25 MG tablet TAKE 1 TABLET BY MOUTH DAILY AS NEEDED Qty: 90 tablet, Refills: 1   Associated Diagnoses: Anxiety    amLODipine (NORVASC) 5 MG tablet Take 1 tablet (5 mg total) by mouth daily. Qty: 30 tablet,  Refills: 6    aspirin EC 325 MG tablet Take 325 mg by mouth at bedtime.    atorvastatin (LIPITOR) 40 MG tablet TAKE 1 TABLET (40 MG TOTAL) BY MOUTH DAILY. Qty: 30 tablet, Refills: 5    Cholecalciferol (VITAMIN D3) 5000 UNITS TABS Take 5,000 Units by mouth at bedtime.     clobetasol cream (TEMOVATE) 0.05 % as needed.     clotrimazole (MYCELEX) 10 MG troche One by mouth five times daily x 5 days Qty: 25 tablet, Refills: 0    cycloSPORINE (RESTASIS) 0.05 % ophthalmic emulsion Place 1 drop into both eyes at bedtime.    diclofenac sodium (VOLTAREN) 1 % GEL Apply 1 application topically 2 (two) times daily as needed (arthritis pain).    fluticasone (FLONASE) 50 MCG/ACT nasal spray Place 2 sprays into both nostrils daily as needed (congestion).    hydrALAZINE (APRESOLINE) 10 MG tablet Take 1 tablet (10 mg total) by mouth 3 (three) times daily. Qty: 90 tablet, Refills: 11    losartan-hydrochlorothiazide (HYZAAR) 100-25 MG per tablet TAKE 1 TABLET BY MOUTH DAILY. Qty: 90 tablet, Refills: 0    metFORMIN (GLUCOPHAGE) 500 MG tablet Take 500 mg by mouth 2 (two) times daily with a meal.    Omega-3 Fatty Acids (FISH OIL) 1000 MG CAPS Take 2,000 mg by mouth 2 (two) times daily.    omeprazole (PRILOSEC) 40 MG capsule Take 1 capsule (40 mg total) by mouth 2 (two) times daily. Qty: 60 capsule, Refills: 11    potassium chloride (K-DUR) 10 MEQ tablet Take 20 mEq by mouth 2 (two) times daily.    PRENATAL VITAMINS PO Take 1 tablet by mouth at bedtime.     Probiotic Product (PROBIOTIC DAILY PO) Take 1 capsule by mouth daily.     torsemide (DEMADEX) 20 MG tablet Take 10 mg by mouth daily as needed (swelling).     tretinoin (RETIN-A) 0.1 % cream Apply 1 application topically every other day.    nitroGLYCERIN (NITROSTAT) 0.4 MG SL tablet Place 0.4 mg under the tongue every 5 (five) minutes as needed for chest pain.      STOP taking these medications     ciprofloxacin (CIPRO) 250 MG tablet       metroNIDAZOLE (FLAGYL) 250 MG tablet        Allergies  Allergen Reactions  . Anticoagulant Compound Itching and Other (See Comments)    Hands and feet tingle and itch.  Darlin Coco [Valdecoxib] Other (See Comments)    Increased lft's  . Doxycycline Diarrhea    N/v/d  . Lipitor [Atorvastatin] Other (See Comments)    Leg weakness  . Lisinopril Cough  . Metoprolol Other (See Comments)    Hair loss  . Mevacor [Lovastatin] Other (See Comments)    Muscle weakness  . Morphine And Related Nausea Only  . Plavix [Clopidogrel Bisulfate] Other (See Comments)    Itching of hands and feet  . Zocor [Simvastatin]     Bones hurt   . Codeine Itching and Rash    Hyper-active  .  Hydrocod Polst-Cpm Polst Er Rash      The results of significant diagnostics from this hospitalization (including imaging, microbiology, ancillary and laboratory) are listed below for reference.    Significant Diagnostic Studies: Dg Chest 2 View  11/15/2014   CLINICAL DATA:  Constant central chest pain for 2 hr. History of triple bypass. Similar symptoms 1 year ago.  EXAM: CHEST  2 VIEW  COMPARISON:  Chest radiograph June 09, 2013  FINDINGS: Cardiac silhouette is unremarkable. Mildly calcified aortic knob. Status post median sternotomy for coronary artery bypass grafting. The lungs are clear without pleural effusions or focal consolidations. Trachea projects midline and there is no pneumothorax. Soft tissue planes and included osseous structures are non-suspicious. Included view of the abdomen demonstrates aortoiliac stent graft.  IMPRESSION: No acute cardiopulmonary process; stable appearance of the chest from June 19, 2013   Electronically Signed   By: Elon Alas   On: 11/15/2014 21:31   Nm Myocar Multi W/spect W/wall Motion / Ef  11/17/2014   CLINICAL DATA:  68 year old female who underwent CABG in 1995, current history of hypertension, diabetes and hyperlipidemia, prior history of abdominal aortic aneurysm repair,  presenting with atypical chest pain.  EXAM: MYOCARDIAL IMAGING WITH SPECT (REST AND EXERCISE)  GATED LEFT VENTRICULAR WALL MOTION STUDY  LEFT VENTRICULAR EJECTION FRACTION  TECHNIQUE: Standard myocardial SPECT imaging was performed after resting intravenous injection of 10 mCi Tc-51m sestamibi. Subsequently, exercise tolerance test was performed by the patient under the supervision of the Cardiology staff. At peak-stress, 30 mCi Tc-4m sestamibi was injected intravenously and standard myocardial SPECT imaging was performed. Quantitative gated imaging was also performed to evaluate left ventricular wall motion, and estimate left ventricular ejection fraction.  COMPARISON:  None.  FINDINGS: Perfusion: No decreased activity in the left ventricle on stress imaging to suggest reversible ischemia or infarction.  Wall Motion: Normal left ventricular wall motion. No left ventricular dilation.  Left Ventricular Ejection Fraction: 25%  End diastolic volume 50 ml  End systolic volume 7 ml  IMPRESSION: 1. No reversible ischemia or infarction.  2. Normal left ventricular wall motion.  3. Left ventricular ejection fraction 86%  4. Low-risk stress test findings*.  *2012 Appropriate Use Criteria for Coronary Revascularization Focused Update: J Am Coll Cardiol. 4270;62(3):762-831. http://content.[email or web address].aspx?articleid=[email or web address]   Electronically Signed   By: Evangeline Dakin M.D.   On: 11/17/2014 12:07    Microbiology: Recent Results (from the past 240 hour(s))  Clostridium Difficile by PCR     Status: None   Collection Time: 11/08/14  2:18 PM  Result Value Ref Range Status   C difficile by pcr Not Detected Not Detected Final    Comment: This test is for use only with liquid or soft stools; performance characteristics of other clinical specimen types have not been established.   This assay was performed by Cepheid GeneXpert(R) PCR. The performance characteristics of this assay have been determined by  [employer]. Performance characteristics refer to the analytical performance of the test.   MRSA PCR Screening     Status: None   Collection Time: 11/15/14 11:29 PM  Result Value Ref Range Status   MRSA by PCR NEGATIVE NEGATIVE Final    Comment:        The GeneXpert MRSA Assay (FDA approved for NASAL specimens only), is one component of a comprehensive MRSA colonization surveillance program. It is not intended to diagnose MRSA infection nor to guide or monitor treatment for MRSA infections.      Labs: Basic  Metabolic Panel:  Recent Labs Lab 11/15/14 2055 11/16/14 0102  NA 142  --   K 4.1  --   CL 101  --   CO2 26  --   GLUCOSE 110*  --   BUN 11  --   CREATININE 0.90 0.79  CALCIUM 10.2  --    Liver Function Tests:  Recent Labs Lab 11/15/14 2102  AST 45*  ALT 49*  ALKPHOS 74  BILITOT 0.4  PROT 7.2  ALBUMIN 4.0    Recent Labs Lab 11/15/14 2102  LIPASE 40   No results for input(s): AMMONIA in the last 168 hours. CBC:  Recent Labs Lab 11/15/14 2055 11/16/14 0102  WBC 10.8* 10.7*  NEUTROABS 6.3  --   HGB 15.2* 14.1  HCT 45.1 42.6  MCV 86.4 86.2  PLT 223 208   Cardiac Enzymes:  Recent Labs Lab 11/15/14 2055 11/16/14 0102 11/16/14 0619 11/16/14 1159  TROPONINI <0.30 <0.30 <0.30 <0.30   BNP: BNP (last 3 results) No results for input(s): PROBNP in the last 8760 hours. CBG:  Recent Labs Lab 11/16/14 1145 11/16/14 1635 11/16/14 2008 11/17/14 0714 11/17/14 1125  GLUCAP 100* 123* 106* 108* 103*       Signed:  FELIZ ORTIZ, ABRAHAM  [facility] 11/17/2014, 3:58 PM

## 2014-11-18 ENCOUNTER — Telehealth: Payer: Self-pay | Admitting: Nurse Practitioner

## 2014-11-18 NOTE — Telephone Encounter (Signed)
Patient calling to let us know she is doing great.

## 2014-11-19 NOTE — Telephone Encounter (Signed)
Good news.

## 2014-12-09 ENCOUNTER — Other Ambulatory Visit (INDEPENDENT_AMBULATORY_CARE_PROVIDER_SITE_OTHER): Payer: Medicare Other | Admitting: *Deleted

## 2014-12-09 DIAGNOSIS — IMO0002 Reserved for concepts with insufficient information to code with codable children: Secondary | ICD-10-CM

## 2014-12-09 DIAGNOSIS — E785 Hyperlipidemia, unspecified: Secondary | ICD-10-CM

## 2014-12-09 DIAGNOSIS — E1165 Type 2 diabetes mellitus with hyperglycemia: Secondary | ICD-10-CM

## 2014-12-09 LAB — LIPID PANEL
Cholesterol: 169 mg/dL (ref 0–200)
HDL: 46.5 mg/dL (ref >39.00)
NonHDL: 122.5
Total CHOL/HDL Ratio: 4
Triglycerides: 281 mg/dL — ABNORMAL HIGH (ref 0.0–149.0)
VLDL: 56.2 mg/dL — ABNORMAL HIGH (ref 0.0–40.0)

## 2014-12-09 LAB — CBC WITH DIFFERENTIAL/PLATELET
Basophils Absolute: 0 10*3/uL (ref 0.0–0.1)
Basophils Relative: 0.4 % (ref 0.0–3.0)
Eosinophils Absolute: 0.3 10*3/uL (ref 0.0–0.7)
Eosinophils Relative: 3.6 % (ref 0.0–5.0)
HCT: 44.7 % (ref 36.0–46.0)
Hemoglobin: 14.7 g/dL (ref 12.0–15.0)
Lymphocytes Relative: 25.8 % (ref 12.0–46.0)
Lymphs Abs: 2.5 10*3/uL (ref 0.7–4.0)
MCHC: 32.9 g/dL (ref 30.0–36.0)
MCV: 88.9 fl (ref 78.0–100.0)
Monocytes Absolute: 0.6 10*3/uL (ref 0.1–1.0)
Monocytes Relative: 5.9 % (ref 3.0–12.0)
Neutro Abs: 6.1 10*3/uL (ref 1.4–7.7)
Neutrophils Relative %: 64.3 % (ref 43.0–77.0)
Platelets: 161 10*3/uL (ref 150.0–400.0)
RBC: 5.03 Mil/uL (ref 3.87–5.11)
RDW: 13.3 % (ref 11.5–15.5)
WBC: 9.6 10*3/uL (ref 4.0–10.5)

## 2014-12-09 LAB — HEPATIC FUNCTION PANEL
ALT: 46 U/L — ABNORMAL HIGH (ref 0–35)
AST: 36 U/L (ref 0–37)
Albumin: 3.6 g/dL (ref 3.5–5.2)
Alkaline Phosphatase: 55 U/L (ref 39–117)
Bilirubin, Direct: 0.1 mg/dL (ref 0.0–0.3)
Total Bilirubin: 0.8 mg/dL (ref 0.2–1.2)
Total Protein: 6 g/dL (ref 6.0–8.3)

## 2014-12-09 LAB — BASIC METABOLIC PANEL
BUN: 13 mg/dL (ref 6–23)
CO2: 28 mEq/L (ref 19–32)
Calcium: 9.6 mg/dL (ref 8.4–10.5)
Chloride: 105 mEq/L (ref 96–112)
Creatinine, Ser: 0.7 mg/dL (ref 0.4–1.2)
GFR: 85.04 mL/min (ref >60.00)
Glucose, Bld: 108 mg/dL — ABNORMAL HIGH (ref 70–99)
Potassium: 3.7 mEq/L (ref 3.5–5.1)
Sodium: 141 mEq/L (ref 135–145)

## 2014-12-09 LAB — HEMOGLOBIN A1C: Hgb A1c MFr Bld: 6.2 % (ref 4.6–6.5)

## 2014-12-09 LAB — LDL CHOLESTEROL, DIRECT: Direct LDL: 87.4 mg/dL

## 2014-12-09 NOTE — Progress Notes (Signed)
Quick Note:  Please make copy of labs for patient visit. ______ 

## 2014-12-12 ENCOUNTER — Ambulatory Visit (INDEPENDENT_AMBULATORY_CARE_PROVIDER_SITE_OTHER): Payer: Medicare Other | Admitting: Cardiology

## 2014-12-12 ENCOUNTER — Other Ambulatory Visit: Payer: Self-pay | Admitting: Cardiology

## 2014-12-12 ENCOUNTER — Encounter: Payer: Self-pay | Admitting: Cardiology

## 2014-12-12 VITALS — BP 130/70 | HR 83 | Ht 61.5 in | Wt 157.8 lb

## 2014-12-12 DIAGNOSIS — E785 Hyperlipidemia, unspecified: Secondary | ICD-10-CM

## 2014-12-12 DIAGNOSIS — I119 Hypertensive heart disease without heart failure: Secondary | ICD-10-CM

## 2014-12-12 DIAGNOSIS — I259 Chronic ischemic heart disease, unspecified: Secondary | ICD-10-CM

## 2014-12-12 MED ORDER — METOPROLOL SUCCINATE ER 25 MG PO TB24: ORAL_TABLET | ORAL | Status: DC

## 2014-12-12 MED ORDER — NITROGLYCERIN 0.4 MG SL SUBL: 0.4000 mg | SUBLINGUAL_TABLET | SUBLINGUAL | Status: DC | PRN

## 2014-12-12 NOTE — Progress Notes (Signed)
Megan [name] Date of Birth:  1946/09/16 [facility] [address] Megan [name], [place]  [address] [phone]        Fax   [phone]   History of Present Illness: This pleasant 69 year old woman is seen for a four-month followup office visit. She has a past history of essential hypertension and a past history of coronary artery disease. Also has a history of dyslipidemia. She is status post coronary artery bypass graft surgery in 1995. She had a renal artery stent for renal artery stenosis in 2008. She had a stent graft of an abdominal aortic aneurysm 2009. She has had chronic mild elevation of liver function enzymes secondary to modest alcohol intake and to fatty liver. In April 2013 she was hospitalized briefly Megan [name] for dizziness and was found to be hypokalemic. She has been weaned off her Demadex and off for her potassium and has had no further episodes of dizziness. Her blood pressure is followed closely by her primary care provider. Since her last office visit she was hospitalized in July 2014 and had cardiac catheterization which showed that her grafts were open. There was no evidence of myocardial damage. She was subsequently sent to her gastroenterologist Megan [name] who increased her Prilosec from 20 mg to 40 mg daily and her chest discomfort has resolved.  She was hospitalized 11/15/2014 with which he describes as an aching sensation in her chest.  She was admitted.  She ruled out by enzymes.  She underwent a treadmill Myoview on 11/17/14 which showed no ischemia and her ejection fraction was 86%. She reports that her blood pressure at home has been running high in the 248-250 systolic range.  He has been having trouble remembering to take her midday hydralazine.   Current Outpatient Prescriptions  Medication Sig Dispense Refill  . ACCU-CHEK FASTCLIX LANCETS MISC   6  . ALPRAZolam (XANAX) 0.25 MG tablet TAKE 1 TABLET BY MOUTH DAILY AS NEEDED 90 tablet 1  .  amLODipine (NORVASC) 5 MG tablet Take 1 tablet (5 mg total) by mouth daily. 30 tablet 6  . aspirin EC 325 MG tablet Take 325 mg by mouth at bedtime.    [name] atorvastatin (LIPITOR) 40 MG tablet TAKE 1 TABLET (40 MG TOTAL) BY MOUTH DAILY. 30 tablet 5  . clobetasol cream (TEMOVATE) 0.05 % as needed.     . cycloSPORINE (RESTASIS) 0.05 % ophthalmic emulsion Place 1 drop into both eyes at bedtime.    . diclofenac sodium (VOLTAREN) 1 % GEL Apply 1 application topically 2 (two) times daily as needed (arthritis pain).    . fluticasone (FLONASE) 50 MCG/ACT nasal spray Place 2 sprays into both nostrils daily as needed (congestion).    . hydrALAZINE (APRESOLINE) 10 MG tablet Take 10 mg by mouth 2 (two) times daily.    [name] losartan-hydrochlorothiazide (HYZAAR) 100-25 MG per tablet TAKE 1 TABLET BY MOUTH DAILY. 90 tablet 0  . metFORMIN (GLUCOPHAGE) 500 MG tablet Take 500 mg by mouth at bedtime.     . nitroGLYCERIN (NITROSTAT) 0.4 MG SL tablet Place 1 tablet (0.4 mg total) under the tongue every 5 (five) minutes as needed for chest pain. 25 tablet PRN  . Omega-3 Fatty Acids (FISH OIL) 1000 MG CAPS Take 2,000 mg by mouth 2 (two) times daily.    [name] omeprazole (PRILOSEC) 40 MG capsule Take 1 capsule (40 mg total) by mouth 2 (two) times daily. 60 capsule 11  . potassium chloride (K-DUR) 10 MEQ tablet Take  20 mEq by mouth 2 (two) times daily.    [name] PRENATAL VITAMINS PO Take 1 tablet by mouth at bedtime.     [name] PROAIR HFA 108 (90 BASE) MCG/ACT inhaler   1  . Probiotic Product (PROBIOTIC DAILY PO) Take 1 capsule by mouth daily.     [name] torsemide (DEMADEX) 20 MG tablet Take 10 mg by mouth daily as needed (swelling).     . tretinoin (RETIN-A) 0.1 % cream Apply 1 application topically every other day.    . triamcinolone cream (KENALOG) 0.1 %   0  . metoprolol succinate (TOPROL XL) 25 MG 24 hr tablet 1/2 TABLET BY MOUTH DAILY 45 tablet 1   No current facility-administered medications for this visit.    Allergies  Allergen  Reactions  . Anticoagulant Compound Itching and Other (See Comments)    Hands and feet tingle and itch.  Megan [name] [Valdecoxib] Other (See Comments)    Increased lft's  . Doxycycline Diarrhea    N/v/d  . Lipitor [Atorvastatin] Other (See Comments)    Leg weakness  . Lisinopril Cough  . Metoprolol Other (See Comments)    Hair loss  . Mevacor [Lovastatin] Other (See Comments)    Muscle weakness  . Morphine And Related Nausea Only  . Plavix [Clopidogrel Bisulfate] Other (See Comments)    Itching of hands and feet  . Zocor [Simvastatin]     Bones hurt   . Codeine Itching and Rash    Hyper-active  . Hydrocod Polst-Cpm Polst Er Rash    Patient Active Problem List   Diagnosis Date Noted  . Benign hypertensive heart disease without heart failure 05/18/2011    Priority: High  . CAD (coronary artery disease) 05/18/2011    Priority: High  . Hyperlipidemia     Priority: High  . Chest pain at rest 11/16/2014  . DM (diabetes mellitus), type 2 with peripheral vascular complications 16/96/7893  . Pain in the chest   . Chest pain 11/15/2014  . LLQ abdominal pain 11/05/2014  . Swelling of limb 03/29/2014  . Aftercare following surgery of the circulatory system, [place] 03/29/2014  . Peripheral vascular disease, unspecified 03/12/2014  . Abdominal aneurysm without mention of rupture 02/19/2014  . Claudication of lower extremity 02/08/2014  . Type II or unspecified type diabetes mellitus without mention of complication, uncontrolled 02/08/2014  . Atherosclerosis of renal artery 09/04/2013  . Coronary artery disease   . Unstable angina 06/09/2013  . Peripheral vascular disease with claudication   . Abnormal LFTs (liver function tests) 09/19/2012  . Diverticulosis 09/19/2012  . Hypokalemia 03/23/2012  . History of abdominal aortic aneurysm repair 05/18/2011  . History of renal artery stenosis 05/18/2011    History  Smoking status  . Former Smoker  . Types: Cigarettes  . Quit date:  12/07/1995  Smokeless tobacco  . Never Used    History  Alcohol Use  . 1.2 oz/week  . 2 Glasses of wine per week    Comment: 8 drinks per wwek    Family History  Problem Relation Age of Onset  . Heart disease Mother   . Heart disease Father   . Heart disease Sister   . Diabetes Sister   . Heart disease Brother   . Diabetes Brother   . Colon cancer Paternal Grandmother   . Pancreatic cancer Neg Hx   . Rectal cancer Neg Hx   . Stomach cancer Neg Hx   . Esophageal cancer Neg Hx     Review  of Systems: Constitutional: no fever chills diaphoresis or fatigue or change in weight.  Head and neck: no hearing loss, no epistaxis, no photophobia or visual disturbance. Respiratory: No cough, shortness of breath or wheezing. Cardiovascular: No chest pain peripheral edema, palpitations. Gastrointestinal: No abdominal distention, no abdominal pain, no change in bowel habits hematochezia or melena. Genitourinary: No dysuria, no frequency, no urgency, no nocturia. Musculoskeletal:No arthralgias, no back pain, no gait disturbance or myalgias. Neurological: No dizziness, no headaches, no numbness, no seizures, no syncope, no weakness, no tremors. Hematologic: No lymphadenopathy, no easy bruising. Psychiatric: No confusion, no hallucinations, no sleep disturbance.    Physical Exam: Filed Vitals:   12/12/14 1423  BP: 130/70  Pulse: 83   General appearance reveals a well-developed well-nourished woman in no distress.The head and neck exam reveals pupils equal and reactive.  Extraocular movements are full.  There is no scleral icterus.  The mouth and pharynx are normal.  The neck is supple.  The carotids reveal no bruits.  The jugular venous pressure is normal.  The  thyroid is not enlarged.  There is no lymphadenopathy.  The chest is clear to percussion and auscultation.  There are no rales or rhonchi.  Expansion of the chest is symmetrical.  The precordium is quiet.  The first heart sound is  normal.  The second heart sound is physiologically split.  There is no murmur gallop rub or click.  There is no abnormal lift or heave.  The abdomen is soft and nontender.  The bowel sounds are normal.  The liver and spleen are not enlarged.  There are no abdominal masses.  There are no abdominal bruits.  Extremities reveal good pedal pulses.  There is no phlebitis or edema.  There is no cyanosis or clubbing.  Strength is normal and symmetrical in all extremities.  There is no lateralizing weakness.  There are no sensory deficits.  The skin is warm and dry.  There is no rash.   Assessment / [name]: 1.  ischemic heart disease status post CABG in 1995.  This recent catheter July 2014 showed no recurrent stenoses.  Most recent treadmill Myoview 11/17/14 normal with ejection fraction 86% 2. hypertensive heart disease 3. Hypercholesterolemia 4. abnormal liver function studies, improved since she has cut back on her alcohol intake 5. status post stent to right renal artery 2008 6. status post stent graft to abdominal aortic aneurysm 2009 7. peripheral artery disease 8. chronic low back pain followed by Dr. Nelva Bush 9. mild diabetes mellitus type 2  [name]: Continue Present medication except we are going to add Toprol XL 12.5 mg each morning to help with blood pressure.  It may also help with her hyperdynamic left ventricle which may have been playing a role in her recent admission for "heart aching". We will reduce her hydralazine to 10 mg twice a day Recheck in 4 months for office visit lipid panel hepatic function panel and basal metabolic panel and EKG.

## 2014-12-12 NOTE — Patient Instructions (Signed)
DECREASE YOUR HYDRALAZINE TO 10 MG TWICE A DAY   START TOPROL 25 MG 1/2 TABLET EVERY MORNING  Your physician wants you to follow-up in: 4 months with fasting labs (lp/bmet/hfp)  You will receive a reminder letter in the mail two months in advance. If you don't receive a letter, please call our office to schedule the follow-up appointment.

## 2014-12-25 ENCOUNTER — Ambulatory Visit (INDEPENDENT_AMBULATORY_CARE_PROVIDER_SITE_OTHER): Payer: Medicare Other | Admitting: Gastroenterology

## 2014-12-25 ENCOUNTER — Encounter: Payer: Self-pay | Admitting: Gastroenterology

## 2014-12-25 VITALS — BP 132/60 | HR 76 | Ht 61.0 in | Wt 159.4 lb

## 2014-12-25 DIAGNOSIS — R194 Change in bowel habit: Secondary | ICD-10-CM

## 2014-12-25 DIAGNOSIS — R079 Chest pain, unspecified: Secondary | ICD-10-CM

## 2014-12-25 DIAGNOSIS — IMO0001 Reserved for inherently not codable concepts without codable children: Secondary | ICD-10-CM

## 2014-12-25 DIAGNOSIS — K219 Gastro-esophageal reflux disease without esophagitis: Secondary | ICD-10-CM

## 2014-12-25 DIAGNOSIS — R143 Flatulence: Secondary | ICD-10-CM

## 2014-12-25 NOTE — Patient Instructions (Addendum)
You have been given a Gas prevention diet.   Thank you for choosing me and [facility].  Pricilla Riffle. Dagoberto Ligas., MD., Marval Regal   cc: Billey Chang, MD

## 2014-12-25 NOTE — Progress Notes (Signed)
History of Present Illness: This is a 69 year old female with a history of GERD and diverticulosis. She was hospitalized in December for a "chest ache" that lasted about 1-1/2 days. Did not have any features that were typical for a GI etiology. Cardiac etiologies were felt to be excluded. No clear cause was uncovered. Her "chest ache" symptoms have not returned. She notes a slight change in her stools to slightly looser and more frequent stools after taking 2 separate courses of antibiotics over the past 2 months. She also complains of increased intestinal gas that responds to Phazyme. She was treated for diverticulitis about 3 months ago with resolution of her symptoms. She underwent EGD and colonoscopy in October 2015. Reports were reviewed.  Allergies  Allergen Reactions  . Anticoagulant Compound Itching and Other (See Comments)    Hands and feet tingle and itch.  Darlin Coco [Valdecoxib] Other (See Comments)    Increased lft's  . Doxycycline Diarrhea    N/v/d  . Lipitor [Atorvastatin] Other (See Comments)    Leg weakness  . Lisinopril Cough  . Metoprolol Other (See Comments)    Hair loss  . Mevacor [Lovastatin] Other (See Comments)    Muscle weakness  . Morphine And Related Nausea Only  . Plavix [Clopidogrel Bisulfate] Other (See Comments)    Itching of hands and feet  . Zocor [Simvastatin]     Bones hurt   . Codeine Itching and Rash    Hyper-active  . Hydrocod Polst-Cpm Polst Er Rash   Outpatient Prescriptions Prior to Visit  Medication Sig Dispense Refill  . ACCU-CHEK FASTCLIX LANCETS MISC   6  . ALPRAZolam (XANAX) 0.25 MG tablet TAKE 1 TABLET BY MOUTH DAILY AS NEEDED 90 tablet 1  . amLODipine (NORVASC) 5 MG tablet Take 1 tablet (5 mg total) by mouth daily. 30 tablet 6  . aspirin EC 325 MG tablet Take 325 mg by mouth at bedtime.    [name] atorvastatin (LIPITOR) 40 MG tablet TAKE 1 TABLET (40 MG TOTAL) BY MOUTH DAILY. 30 tablet 5  . clobetasol cream (TEMOVATE) 0.05 % as needed.      . cycloSPORINE (RESTASIS) 0.05 % ophthalmic emulsion Place 1 drop into both eyes at bedtime.    . diclofenac sodium (VOLTAREN) 1 % GEL Apply 1 application topically 2 (two) times daily as needed (arthritis pain).    . fluticasone (FLONASE) 50 MCG/ACT nasal spray Place 2 sprays into both nostrils daily as needed (congestion).    . hydrALAZINE (APRESOLINE) 10 MG tablet Take 10 mg by mouth 2 (two) times daily.    [name] losartan-hydrochlorothiazide (HYZAAR) 100-25 MG per tablet TAKE 1 TABLET BY MOUTH DAILY. 90 tablet 0  . metFORMIN (GLUCOPHAGE) 500 MG tablet Take 500 mg by mouth at bedtime.     . metoprolol succinate (TOPROL XL) 25 MG 24 hr tablet 1/2 TABLET BY MOUTH DAILY 45 tablet 1  . nitroGLYCERIN (NITROSTAT) 0.4 MG SL tablet Place 1 tablet (0.4 mg total) under the tongue every 5 (five) minutes as needed for chest pain. 25 tablet PRN  . Omega-3 Fatty Acids (FISH OIL) 1000 MG CAPS Take 2,000 mg by mouth 2 (two) times daily.    [name] omeprazole (PRILOSEC) 40 MG capsule Take 1 capsule (40 mg total) by mouth 2 (two) times daily. 60 capsule 11  . potassium chloride (K-DUR) 10 MEQ tablet Take 20 mEq by mouth 2 (two) times daily.    [name] PRENATAL VITAMINS PO Take 1 tablet by mouth at bedtime.     [name]  PROAIR HFA 108 (90 BASE) MCG/ACT inhaler   1  . Probiotic Product (PROBIOTIC DAILY PO) Take 1 capsule by mouth daily.     [name] torsemide (DEMADEX) 20 MG tablet Take 10 mg by mouth daily as needed (swelling).     . tretinoin (RETIN-A) 0.1 % cream Apply 1 application topically every other day.    . triamcinolone cream (KENALOG) 0.1 %   0   No facility-administered medications prior to visit.   Past Medical History  Diagnosis Date  . Coronary artery disease     a. s/p CABG in 1995;  b. 05/2013 Neg MV, EF 82%;  c. 06/2013 Cath: LM nl, LAD 40-50p, LCX nl, RCA 80p/131m, VG->RCA->PDA->PLSA min irregs, LIMA->LAD atretic, vigorous LV fxn.  . Renal artery stenosis     a. 2008 s/p PTA  . Hypertension   . Hyperlipidemia   .  Osteoarthritis   . Fibromyalgia   . Eczema, dyshidrotic   . GERD (gastroesophageal reflux disease)   . AAA (abdominal aortic aneurysm)     a. s/p stent grafting in 2009.  . Diverticulosis   . Adenomatous colon polyp 05/2001  . Peripheral arterial disease     left leg diagnosed by Dr Mare Ferrari  . Adenomatous duodenal polyp   . Myocardial infarction   . Anginal pain    Past Surgical History  Procedure Laterality Date  . Renal artery stent  2008  . Coronary artery bypass graft  07/1994    Triple by Dr Ceasar Mons  . Carpal tunnel release  08/1993    left wrist  . Tonsilectomy, adenoidectomy, bilateral myringotomy and tubes  1989  . Total abdominal hysterectomy  1992  . Carpal tunnel release  01/1997    right  . Thyroid cyst apiration  05/1997 and 07/1998  . Cardiovascular stress test  11/11/2009    normal study  . Cardiac catheterization    . Abdominal aortic aneurysm repair  2009    stenting  . Left heart catheterization with coronary/graft angiogram N/A 06/11/2013    Procedure: LEFT HEART CATHETERIZATION WITH [name];  Surgeon: Thayer Headings, MD;  Location: [facility] CATH LAB;  Service: Cardiovascular;  Laterality: N/A;  . Abdominal aortagram N/A 03/06/2014    Procedure: ABDOMINAL AORTAGRAM;  Surgeon: Rosetta Posner, MD;  Location: [facility] CATH LAB;  Service: Cardiovascular;  Laterality: N/A;   History   Social History  . Marital Status: Married    Spouse Name: N/A    Number of Children: N/A  . Years of Education: N/A   Social History Main Topics  . Smoking status: Former Smoker    Types: Cigarettes    Quit date: 12/07/1995  . Smokeless tobacco: Never Used  . Alcohol Use: 1.2 oz/week    2 Glasses of wine per week     Comment: 8 drinks per wwek  . Drug Use: No  . Sexual Activity: Not Currently   Other Topics Concern  . None   Social History Narrative   Family History  Problem Relation Age of Onset  . Heart disease Mother   . Heart disease Father   . Heart  disease Sister   . Diabetes Sister   . Heart disease Brother   . Diabetes Brother   . Colon cancer Paternal Grandmother   . Pancreatic cancer Neg Hx   . Rectal cancer Neg Hx   . Stomach cancer Neg Hx   . Esophageal cancer Neg Hx      Physical Exam: General: Well  developed , well nourished, no acute distress Head: Normocephalic and atraumatic Eyes:  sclerae anicteric, EOMI Ears: Normal auditory acuity Mouth: No deformity or lesions Lungs: Clear throughout to auscultation Heart: Regular rate and rhythm; no murmurs, rubs or bruits Abdomen: Soft, non tender and non distended. No masses, hepatosplenomegaly or hernias noted. Normal Bowel sounds Musculoskeletal: Symmetrical with no gross deformities  Pulses:  Normal pulses noted Extremities: No clubbing, cyanosis, edema or deformities noted Neurological: Alert oriented x 4, grossly nonfocal Psychological:  Alert and cooperative. Normal mood and affect   Assessment and Recommendations:  1. GERD. Continue standard antireflux measures. Continue omeprazole 40 mg twice daily taken 30 minutes before breakfast and dinner. Her episode of chest pain in December does not appear to be GI related.  2. Personal history of adenomatous colon polyps. Five-year interval surveillance colonoscopy recommended in October 2020.  3. Diverticulosis with recent history of diverticulitis. Maintain long-term adequate daily water intake and reasonable amount of daily fiber intake.  4. Slight change in bowel habits with slightly looser and more frequent stools. Changes may be related to 2 recent courses of antibiotics. Advised her to decrease her fiber intake and attempt to determine if any particular foods are triggering symptoms. If diarrhea worsens obtain stool studies.  5. Intestinal gas. Low gas diet and Gas-X or Phazyme 4 times daily as needed.

## 2015-01-06 ENCOUNTER — Telehealth: Payer: Self-pay | Admitting: Cardiology

## 2015-01-06 NOTE — Telephone Encounter (Signed)
Informed patient of changes. Patient verbalized understanding.

## 2015-01-06 NOTE — Telephone Encounter (Signed)
Pt c/o medication issue: 1. Name of Medication: metoprolol 2. How are you currently taking this medication (dosage and times per day)? 1/2 per day. 3. Are you having a reaction (difficulty breathing--STAT)?  No reaction  4. What is your medication issue? It causes her to lose hair and it slows her down. Please call back to discuss.

## 2015-01-06 NOTE — Telephone Encounter (Signed)
Okay to stop metoprolol.   increase hydralazine to TID

## 2015-01-06 NOTE — Telephone Encounter (Signed)
Returned patient's call. Patient does not want to take Metoprolol any more. Patient stated that this medication is causing hair loss and slowing her down. Patient's BP 137/70 and HR 66. Patient has discussed this with Dr. Mare Ferrari in the past and she is wanting to try something else if possible. Will forward to Dr. Mare Ferrari for further instructions. Patient verbalized understanding.

## 2015-01-07 NOTE — Addendum Note (Signed)
Addended by: Alvina Filbert B on: 01/07/2015 01:21 PM   Modules accepted: Orders, Medications

## 2015-02-17 ENCOUNTER — Other Ambulatory Visit: Payer: Self-pay | Admitting: Cardiology

## 2015-02-20 ENCOUNTER — Other Ambulatory Visit: Payer: Self-pay | Admitting: Cardiology

## 2015-02-23 ENCOUNTER — Other Ambulatory Visit: Payer: Self-pay | Admitting: Cardiology

## 2015-02-27 ENCOUNTER — Telehealth: Payer: Self-pay

## 2015-02-27 NOTE — Telephone Encounter (Signed)
Spoke with pharmacist and patient was given 2 Rx's of Klor Con M 10 in error Per pharm D they are metabolized a little differently but should be no clinical effects to patient Patient is aware of this By law Pharm D is required to let physician know Will forward to  Dr. Mare Ferrari for review

## 2015-02-27 NOTE — Telephone Encounter (Signed)
Pharmacy called to let us know that they filled the wrong Klor-Con instead of 10 meg it was fill with m 10

## 2015-03-14 ENCOUNTER — Other Ambulatory Visit: Payer: Self-pay | Admitting: Vascular Surgery

## 2015-03-14 LAB — BUN: BUN: 18 mg/dL (ref 6–23)

## 2015-03-14 LAB — CREATININE, SERUM: Creat: 0.97 mg/dL (ref 0.50–1.10)

## 2015-03-18 ENCOUNTER — Ambulatory Visit
Admission: RE | Admit: 2015-03-18 | Discharge: 2015-03-18 | Disposition: A | Discharge status: Home / Self Care | Payer: Medicare Other | Source: Ambulatory Visit | Attending: Vascular Surgery | Admitting: Vascular Surgery

## 2015-03-18 ENCOUNTER — Ambulatory Visit: Payer: Medicare Other | Admitting: Vascular Surgery

## 2015-03-18 DIAGNOSIS — I739 Peripheral vascular disease, unspecified: Secondary | ICD-10-CM

## 2015-03-18 DIAGNOSIS — Z48812 Encounter for surgical aftercare following surgery on the circulatory system: Secondary | ICD-10-CM

## 2015-03-18 MED ORDER — IOHEXOL 350 MG/ML SOLN
75.0000 mL | Freq: Once | INTRAVENOUS | Status: AC | PRN
  Administered 2015-03-18: 75 mL via INTRAVENOUS

## 2015-03-24 ENCOUNTER — Encounter: Payer: Self-pay | Admitting: Vascular Surgery

## 2015-03-25 ENCOUNTER — Encounter: Payer: Self-pay | Admitting: Vascular Surgery

## 2015-03-25 ENCOUNTER — Ambulatory Visit (INDEPENDENT_AMBULATORY_CARE_PROVIDER_SITE_OTHER): Payer: Medicare Other | Admitting: Vascular Surgery

## 2015-03-25 VITALS — BP 122/63 | HR 63 | Ht 61.0 in | Wt 161.4 lb

## 2015-03-25 DIAGNOSIS — I714 Abdominal aortic aneurysm, without rupture, unspecified: Secondary | ICD-10-CM

## 2015-03-25 DIAGNOSIS — Z48812 Encounter for surgical aftercare following surgery on the circulatory system: Secondary | ICD-10-CM | POA: Diagnosis not present

## 2015-03-25 NOTE — Progress Notes (Signed)
The patient is today for follow-up of stent graft repair of abdominal aortic aneurysm. She had a stent graft repair of abdominal aortic aneurysm several years ago. She had presented to just over one year ago with claudication in her left leg. CT showed that the left limb of her stent graft landed where her common iliac artery made a right angle turn. This was causing exercise flow limitation. She had successful stenting across this area via a left brachial approach. She reports that over the past several weeks she has noticed with a great deal of walking that she can have some mild left calf cramping which resolves quickly. She reports this is nonlimiting and his been recent onset.  Past Medical History  Diagnosis Date  . Coronary artery disease     a. s/p CABG in 1995;  b. 05/2013 Neg MV, EF 82%;  c. 06/2013 Cath: LM nl, LAD 40-50p, LCX nl, RCA 80p/19m, VG->RCA->PDA->PLSA min irregs, LIMA->LAD atretic, vigorous LV fxn.  . Renal artery stenosis     a. 2008 s/p PTA  . Hypertension   . Hyperlipidemia   . Osteoarthritis   . Fibromyalgia   . Eczema, dyshidrotic   . GERD (gastroesophageal reflux disease)   . AAA (abdominal aortic aneurysm)     a. s/p stent grafting in 2009.  . Diverticulosis   . Adenomatous colon polyp 05/2001  . Peripheral arterial disease     left leg diagnosed by Dr Mare Ferrari  . Adenomatous duodenal polyp   . Myocardial infarction   . Anginal pain   . COPD (chronic obstructive pulmonary disease)   . Diabetes mellitus without complication     History  Substance Use Topics  . Smoking status: Former Smoker    Types: Cigarettes    Quit date: 12/07/1995  . Smokeless tobacco: Never Used  . Alcohol Use: 3.6 - 6.0 oz/week    6-10 Glasses of wine per week     Comment: 8 drinks per wwek    Family History  Problem Relation Age of Onset  . Heart disease Mother   . Heart attack Mother   . Heart disease Father     before age 50  . Heart attack Father   . Heart disease  Sister     before age 1  . Diabetes Sister   . Hyperlipidemia Sister   . Hypertension Sister   . Heart attack Sister   . Heart disease Brother   . Diabetes Brother   . Hyperlipidemia Brother   . Hypertension Brother   . Heart attack Brother   . AAA (abdominal aortic aneurysm) Brother   . Colon cancer Paternal Grandmother   . Pancreatic cancer Neg Hx   . Rectal cancer Neg Hx   . Stomach cancer Neg Hx   . Esophageal cancer Neg Hx     Allergies  Allergen Reactions  . Anticoagulant Compound Itching and Other (See Comments)    Hands and feet tingle and itch.  Darlin Coco [Valdecoxib] Other (See Comments)    Increased lft's  . Doxycycline Diarrhea    N/v/d  . Lipitor [Atorvastatin] Other (See Comments)    Leg weakness  . Lisinopril Cough  . Metoprolol Other (See Comments)    Hair loss  . Mevacor [Lovastatin] Other (See Comments)    Muscle weakness  . Morphine And Related Nausea Only  . Plavix [Clopidogrel Bisulfate] Other (See Comments)    Itching of hands and feet  . Zocor [Simvastatin]     Bones hurt   .  Codeine Itching and Rash    Hyper-active  . Hydrocod Polst-Cpm Polst Er Rash     Current outpatient prescriptions:  .  ACCU-CHEK FASTCLIX LANCETS MISC, , Disp: , Rfl: 6 .  ALPRAZolam (XANAX) 0.25 MG tablet, TAKE 1 TABLET BY MOUTH DAILY AS NEEDED, Disp: 90 tablet, Rfl: 1 .  amLODipine (NORVASC) 5 MG tablet, Take 1 tablet (5 mg total) by mouth daily., Disp: 30 tablet, Rfl: 6 .  aspirin EC 325 MG tablet, Take 325 mg by mouth at bedtime., Disp: , Rfl:  .  atorvastatin (LIPITOR) 40 MG tablet, TAKE 1 TABLET (40 MG TOTAL) BY MOUTH DAILY., Disp: 30 tablet, Rfl: 5 .  clobetasol cream (TEMOVATE) 0.05 %, as needed. , Disp: , Rfl:  .  cycloSPORINE (RESTASIS) 0.05 % ophthalmic emulsion, Place 1 drop into both eyes at bedtime., Disp: , Rfl:  .  diclofenac sodium (VOLTAREN) 1 % GEL, Apply 1 application topically 2 (two) times daily as needed (arthritis pain)., Disp: , Rfl:  .   fluticasone (FLONASE) 50 MCG/ACT nasal spray, Place 2 sprays into both nostrils daily as needed (congestion)., Disp: , Rfl:  .  hydrALAZINE (APRESOLINE) 10 MG tablet, Take 10 mg by mouth 3 (three) times daily., Disp: , Rfl:  .  KLOR-CON 10 10 MEQ tablet, TAKE 2 TABLET TWICE A DAY, Disp: 360 tablet, Rfl: 0 .  losartan-hydrochlorothiazide (HYZAAR) 100-25 MG per tablet, TAKE 1 TABLET BY MOUTH DAILY., Disp: 90 tablet, Rfl: 0 .  metFORMIN (GLUCOPHAGE) 500 MG tablet, Take 500 mg by mouth at bedtime. , Disp: , Rfl:  .  nitroGLYCERIN (NITROSTAT) 0.4 MG SL tablet, Place 1 tablet (0.4 mg total) under the tongue every 5 (five) minutes as needed for chest pain., Disp: 25 tablet, Rfl: PRN .  Omega-3 Fatty Acids (FISH OIL) 1000 MG CAPS, Take 2,000 mg by mouth 2 (two) times daily., Disp: , Rfl:  .  omeprazole (PRILOSEC) 40 MG capsule, Take 1 capsule (40 mg total) by mouth 2 (two) times daily., Disp: 60 capsule, Rfl: 11 .  potassium chloride (K-DUR) 10 MEQ tablet, Take 20 mEq by mouth 2 (two) times daily., Disp: , Rfl:  .  PRENATAL VITAMINS PO, Take 1 tablet by mouth at bedtime. , Disp: , Rfl:  .  PROAIR HFA 108 (90 BASE) MCG/ACT inhaler, , Disp: , Rfl: 1 .  Probiotic Product (PROBIOTIC DAILY PO), Take 1 capsule by mouth daily. , Disp: , Rfl:  .  torsemide (DEMADEX) 20 MG tablet, TAKE 1/2 A TABLET DAILY AS NEEDED, Disp: 15 tablet, Rfl: 3 .  tretinoin (RETIN-A) 0.1 % cream, Apply 1 application topically every other day., Disp: , Rfl:  .  triamcinolone cream (KENALOG) 0.1 %, , Disp: , Rfl: 0  Filed Vitals:   03/25/15 0959  BP: 122/63  Pulse: 63  Height: 5\' 1"  (1.549 m)  Weight: 161 lb 6.4 oz (73.211 kg)  SpO2: 95%    Body mass index is 30.51 kg/(m^2).       Exam will well-nourished white female no acute distress Radial pulses are 2+ bilaterally. No evidence of brachial artery injury on the left Respirations are equal nonlabored Her dorsalis pedis pulses are 2+ bilaterally.  CT scan from earlier  this week was reviewed with patient. This reveals excellent positioning of her stent graft with maximal sac size of 4.2 cm. The prior area of angulation has been corrected with stent on her left stent to artery junction.  Stable overall. We'll monitor her left calf symptoms. Understands  there is no risk from this. Should this progress. Otherwise we'll see her again in one year with repeat CT scan follow-up

## 2015-03-27 NOTE — Addendum Note (Signed)
Addended by: Mena Goes on: 03/27/2015 01:19 PM   Modules accepted: Orders

## 2015-04-02 ENCOUNTER — Other Ambulatory Visit: Payer: Self-pay | Admitting: Cardiology

## 2015-04-10 ENCOUNTER — Other Ambulatory Visit: Payer: Medicare Other

## 2015-04-16 ENCOUNTER — Other Ambulatory Visit: Payer: Medicare Other

## 2015-04-18 ENCOUNTER — Ambulatory Visit: Payer: Medicare Other | Admitting: Cardiology

## 2015-04-23 ENCOUNTER — Other Ambulatory Visit: Payer: Medicare Other

## 2015-05-02 ENCOUNTER — Other Ambulatory Visit (INDEPENDENT_AMBULATORY_CARE_PROVIDER_SITE_OTHER): Payer: Medicare Other | Admitting: *Deleted

## 2015-05-02 DIAGNOSIS — I119 Hypertensive heart disease without heart failure: Secondary | ICD-10-CM

## 2015-05-02 DIAGNOSIS — E785 Hyperlipidemia, unspecified: Secondary | ICD-10-CM | POA: Diagnosis not present

## 2015-05-02 DIAGNOSIS — E1159 Type 2 diabetes mellitus with other circulatory complications: Secondary | ICD-10-CM | POA: Diagnosis not present

## 2015-05-02 DIAGNOSIS — E1151 Type 2 diabetes mellitus with diabetic peripheral angiopathy without gangrene: Secondary | ICD-10-CM

## 2015-05-02 LAB — HEPATIC FUNCTION PANEL
ALT: 62 U/L — ABNORMAL HIGH (ref 0–35)
AST: 46 U/L — ABNORMAL HIGH (ref 0–37)
Albumin: 3.9 g/dL (ref 3.5–5.2)
Alkaline Phosphatase: 85 U/L (ref 39–117)
Bilirubin, Direct: 0.2 mg/dL (ref 0.0–0.3)
Total Bilirubin: 0.6 mg/dL (ref 0.2–1.2)
Total Protein: 6.5 g/dL (ref 6.0–8.3)

## 2015-05-02 LAB — LIPID PANEL
Cholesterol: 151 mg/dL (ref 0–200)
HDL: 48.1 mg/dL (ref >39.00)
LDL Cholesterol: 66 mg/dL (ref 0–99)
NonHDL: 102.9
Total CHOL/HDL Ratio: 3
Triglycerides: 186 mg/dL — ABNORMAL HIGH (ref 0.0–149.0)
VLDL: 37.2 mg/dL (ref 0.0–40.0)

## 2015-05-02 LAB — BASIC METABOLIC PANEL
BUN: 11 mg/dL (ref 6–23)
CO2: 31 mEq/L (ref 19–32)
Calcium: 9.6 mg/dL (ref 8.4–10.5)
Chloride: 99 mEq/L (ref 96–112)
Creatinine, Ser: 0.76 mg/dL (ref 0.40–1.20)
GFR: 79.81 mL/min (ref >60.00)
Glucose, Bld: 114 mg/dL — ABNORMAL HIGH (ref 70–99)
Potassium: 3.5 mEq/L (ref 3.5–5.1)
Sodium: 137 mEq/L (ref 135–145)

## 2015-05-02 LAB — HEMOGLOBIN A1C: Hgb A1c MFr Bld: 6 % (ref 4.6–6.5)

## 2015-05-02 NOTE — Addendum Note (Signed)
Addended by: Eulis Foster on: 05/02/2015 07:55 AM   Modules accepted: Orders

## 2015-05-02 NOTE — Addendum Note (Signed)
Addended by: Eulis Foster on: 05/02/2015 07:54 AM   Modules accepted: Orders

## 2015-05-05 NOTE — Progress Notes (Signed)
Quick Note:  Please make copy of labs for patient visit. ______ 

## 2015-05-08 ENCOUNTER — Encounter: Payer: Self-pay | Admitting: Cardiology

## 2015-05-08 ENCOUNTER — Ambulatory Visit (INDEPENDENT_AMBULATORY_CARE_PROVIDER_SITE_OTHER): Payer: Medicare Other | Admitting: Cardiology

## 2015-05-08 VITALS — BP 120/60 | HR 72 | Ht 61.5 in | Wt 159.0 lb

## 2015-05-08 DIAGNOSIS — I259 Chronic ischemic heart disease, unspecified: Secondary | ICD-10-CM | POA: Diagnosis not present

## 2015-05-08 DIAGNOSIS — I119 Hypertensive heart disease without heart failure: Secondary | ICD-10-CM

## 2015-05-08 DIAGNOSIS — M791 Myalgia, unspecified site: Secondary | ICD-10-CM

## 2015-05-08 DIAGNOSIS — E119 Type 2 diabetes mellitus without complications: Secondary | ICD-10-CM

## 2015-05-08 DIAGNOSIS — E785 Hyperlipidemia, unspecified: Secondary | ICD-10-CM | POA: Diagnosis not present

## 2015-05-08 NOTE — Progress Notes (Signed)
Cardiology Office Note   Date:  05/08/2015   ID:  Megan [name], DOB 1946/03/28, MRN [id number]  PCP:  [name], MD  Cardiologist: Darlin Coco MD  Chief Complaint  Patient presents with  . Follow-up    4 month       History of Present Illness: Megan [name] is a 69 y.o. female who presents for a four-month follow-up office visit.  She has a past history of essential hypertension and a past history of coronary artery disease. Also has a history of dyslipidemia. She is status post coronary artery bypass graft surgery in 1995. She had a renal artery stent for renal artery stenosis in 2008. She had a stent graft of an abdominal aortic aneurysm 2009. She has had chronic mild elevation of liver function enzymes secondary to modest alcohol intake and to fatty liver. In April 2013 she was hospitalized briefly Megan [name] for dizziness and was found to be hypokalemic. She has been weaned off her Demadex and off for her potassium and has had no further episodes of dizziness. Her blood pressure is followed closely by her primary care provider. Since her last office visit she was hospitalized in July 2014 and had cardiac catheterization which showed that her grafts were open. There was no evidence of myocardial damage. She was subsequently sent to her gastroenterologist [name] who increased her Prilosec from 20 mg to 40 mg daily and her chest discomfort has resolved.  She was hospitalized 11/15/2014 with which he describes as an aching sensation in her chest. She was admitted. She ruled out by enzymes. She underwent a treadmill Myoview on 11/17/14 which showed no ischemia and her ejection fraction was 86%. Since last visit she has had no new cardiac symptoms.  She has been doing some kayaking which she enjoys.    Past Medical History  Diagnosis Date  . Coronary artery disease     a. s/p CABG in 1995;  b. 05/2013 Neg MV, EF 82%;  c. 06/2013 Cath: LM nl, LAD 40-50p, LCX nl, RCA  80p/153m, VG->RCA->PDA->PLSA min irregs, LIMA->LAD atretic, vigorous LV fxn.  . Renal artery stenosis     a. 2008 s/p PTA  . Hypertension   . Hyperlipidemia   . Osteoarthritis   . Fibromyalgia   . Eczema, dyshidrotic   . GERD (gastroesophageal reflux disease)   . AAA (abdominal aortic aneurysm)     a. s/p stent grafting in 2009.  . Diverticulosis   . Adenomatous colon polyp 05/2001  . Peripheral arterial disease     left leg diagnosed by Dr Mare Ferrari  . Adenomatous duodenal polyp   . Myocardial infarction   . Anginal pain   . COPD (chronic obstructive pulmonary disease)   . Diabetes mellitus without complication     Past Surgical History  Procedure Laterality Date  . Renal artery stent  2008  . Coronary artery bypass graft  07/1994    Triple by Dr Ceasar Mons  . Carpal tunnel release  08/1993    left wrist  . Tonsilectomy, adenoidectomy, bilateral myringotomy and tubes  1989  . Total abdominal hysterectomy  1992  . Carpal tunnel release  01/1997    right  . Thyroid cyst apiration  05/1997 and 07/1998  . Cardiovascular stress test  11/11/2009    normal study  . Cardiac catheterization    . Abdominal aortic aneurysm repair  2009    stenting  . Left heart catheterization with coronary/graft angiogram N/A 06/11/2013  Procedure: LEFT HEART CATHETERIZATION WITH [name];  Surgeon: Thayer Headings, MD;  Location: [facility] CATH LAB;  Service: Cardiovascular;  Laterality: N/A;  . Abdominal aortagram N/A 03/06/2014    Procedure: ABDOMINAL AORTAGRAM;  Surgeon: Rosetta Posner, MD;  Location: [facility] CATH LAB;  Service: Cardiovascular;  Laterality: N/A;     Current Outpatient Prescriptions  Medication Sig Dispense Refill  . ACCU-CHEK FASTCLIX LANCETS MISC   6  . ALPRAZolam (XANAX) 0.25 MG tablet Take 0.25 mg by mouth at bedtime as needed for anxiety.    [name] amLODipine (NORVASC) 5 MG tablet TAKE 1 TABLET (5 MG TOTAL) BY MOUTH DAILY. 30 tablet 1  . aspirin EC 325 MG tablet Take 325 mg by  mouth at bedtime.    [name] atorvastatin (LIPITOR) 40 MG tablet TAKE 1 TABLET (40 MG TOTAL) BY MOUTH DAILY. 30 tablet 5  . cycloSPORINE (RESTASIS) 0.05 % ophthalmic emulsion Place 1 drop into both eyes at bedtime.    . diclofenac sodium (VOLTAREN) 1 % GEL Apply 1 application topically 2 (two) times daily as needed (arthritis pain).    . fluticasone (FLONASE) 50 MCG/ACT nasal spray Place 2 sprays into both nostrils daily as needed (congestion).    . hydrALAZINE (APRESOLINE) 10 MG tablet Take 10 mg by mouth 3 (three) times daily.    [name] KLOR-CON 10 10 MEQ tablet TAKE 2 TABLET TWICE A DAY 360 tablet 0  . losartan-hydrochlorothiazide (HYZAAR) 100-25 MG per tablet TAKE 1 TABLET BY MOUTH DAILY. 90 tablet 0  . Magnesium 250 MG TABS Take 1 tablet by mouth daily.    . metFORMIN (GLUCOPHAGE) 500 MG tablet Take 500 mg by mouth at bedtime.     . nitroGLYCERIN (NITROSTAT) 0.4 MG SL tablet Place 1 tablet (0.4 mg total) under the tongue every 5 (five) minutes as needed for chest pain. 25 tablet PRN  . Omega-3 Fatty Acids (FISH OIL) 1000 MG CAPS Take 2,000 mg by mouth 2 (two) times daily.    [name] omeprazole (PRILOSEC) 40 MG capsule Take 1 capsule (40 mg total) by mouth 2 (two) times daily. 60 capsule 11  . potassium chloride (K-DUR) 10 MEQ tablet Take 20 mEq by mouth 2 (two) times daily.    [name] PRENATAL VITAMINS PO Take 1 tablet by mouth at bedtime.     [name] PROAIR HFA 108 (90 BASE) MCG/ACT inhaler Inhale 1 puff into the lungs every 4 (four) hours as needed for wheezing or shortness of breath.   1  . Probiotic Product (PROBIOTIC DAILY PO) Take 1 capsule by mouth daily.     [name] torsemide (DEMADEX) 20 MG tablet Take 10 mg by mouth daily as needed (for fluid).    . tretinoin (RETIN-A) 0.1 % cream Apply 1 application topically every other day.     No current facility-administered medications for this visit.    Allergies:   Plavix ; Anticoagulant compound; Bextra; Doxycycline; Lipitor; Lisinopril; Metoprolol; Mevacor; Morphine and  related; Plavix; Zocor; Codeine; and Hydrocod polst-cpm polst er    Social History:  The patient  reports that she quit smoking about 19 years ago. Her smoking use included Cigarettes. She has never used smokeless tobacco. She reports that she drinks about 3.6 - 6.0 oz of alcohol per week. She reports that she does not use illicit drugs.   Family History:  The patient's family history includes AAA (abdominal aortic aneurysm) in her brother; Colon cancer in her paternal grandmother; Diabetes in her brother and sister; Heart attack in her  brother, father, mother, and sister; Heart disease in her brother, father, mother, and sister; Hyperlipidemia in her brother and sister; Hypertension in her brother and sister. There is no history of Pancreatic cancer, Rectal cancer, Stomach cancer, or Esophageal cancer.    ROS:  Please see the history of present illness.   Otherwise, review of systems are positive for none.   All other systems are reviewed and negative.    PHYSICAL EXAM: VS:  BP 120/60 mmHg  Pulse 72  Ht 5' 1.5" (1.562 m)  Wt 159 lb (72.122 kg)  BMI 29.56 kg/m2 , BMI Body mass index is 29.56 kg/(m^2). GEN: Well nourished, well developed, in no acute distress HEENT: normal Neck: no JVD, carotid bruits, or masses Cardiac: Irregularly irregular: no murmurs, rubs, or gallops,no edema  Respiratory:  clear to auscultation bilaterally, normal work of breathing GI: soft, nontender, nondistended, + BS MS: no deformity or atrophy Skin: warm and dry, no rash Neuro:  Strength and sensation are intact Psych: euthymic mood, full affect   EKG:  EKG is ordered today. The ekg ordered today demonstrates normal sinus rhythm and is within normal limits.  Mild nonspecific T-wave flattening is present.   Recent Labs: 11/16/2014: TSH 2.950 12/09/2014: Hemoglobin 14.7; Platelets 161.0 05/02/2015: ALT 62*; BUN 11; Creatinine 0.76; Potassium 3.5; Sodium 137    Lipid Panel    Component Value Date/Time    CHOL 151 05/02/2015 0756   TRIG 186.0* 05/02/2015 0756   HDL 48.10 05/02/2015 0756   CHOLHDL 3 05/02/2015 0756   VLDL 37.2 05/02/2015 0756   LDLCALC 66 05/02/2015 0756   LDLDIRECT 87.4 12/09/2014 0808      Wt Readings from Last 3 Encounters:  05/08/15 159 lb (72.122 kg)  03/25/15 161 lb 6.4 oz (73.211 kg)  12/25/14 159 lb 6 oz (72.292 kg)       ASSESSMENT AND PLAN:  1. ischemic heart disease status post CABG in 1995. This recent catheter July 2014 showed no recurrent stenoses. Most recent treadmill Myoview 11/17/14 normal with ejection fraction 86% 2. hypertensive heart disease 3. Hypercholesterolemia 4. abnormal liver function studies, she is still drinking 2-3 glasses of wine nightly.  I have asked her to cut back to just one glass nightly 5. status post stent to right renal artery 2008 6. status post stent graft to abdominal aortic aneurysm 2009 7. peripheral artery disease 8. chronic low back pain followed by Dr. Nelva Bush 9. mild diabetes mellitus type 2  Plan: Continue current medication. Recheck in 4 months for office visit lipid panel hepatic function panel and basal metabolic panel and total CK and magnesium level.  She complains of some perceived weakness in her legs and wonders if it could be related to muscle damage.   Current medicines are reviewed at length with the patient today.  The patient does not have concerns regarding medicines.  The following changes have been made:  no change  Labs/ tests ordered today include:  Orders Placed This Encounter  Procedures  . Lipid panel  . Hepatic function panel  . Basic metabolic panel  . Hemoglobin A1c  . Magnesium  . CK (Creatine Kinase)  . EKG 12-Lead    Signed, Darlin Coco MD 05/08/2015 1:43 PM    [facility] [address], [place], [place]  [address] Phone: [phone]; Fax: [phone]

## 2015-05-08 NOTE — Patient Instructions (Signed)
Medication Instructions:  Your physician recommends that you continue on your current medications as directed. Please refer to the Current Medication list given to you today.  Labwork: NONE  Testing/Procedures: NONE  Follow-Up: Your physician wants you to follow-up in: 4 months with fasting labs (lp/bmet/hfp/A1C/TOTAL CK)  You will receive a reminder letter in the mail two months in advance. If you don't receive a letter, please call our office to schedule the follow-up appointment.

## 2015-05-23 ENCOUNTER — Other Ambulatory Visit: Payer: Self-pay | Admitting: Cardiology

## 2015-06-06 ENCOUNTER — Other Ambulatory Visit: Payer: Self-pay | Admitting: Cardiology

## 2015-06-17 ENCOUNTER — Telehealth: Payer: Self-pay | Admitting: Gastroenterology

## 2015-06-17 MED ORDER — METRONIDAZOLE 500 MG PO TABS: 500.0000 mg | ORAL_TABLET | Freq: Two times a day (BID) | ORAL | Status: DC

## 2015-06-17 MED ORDER — CIPROFLOXACIN HCL 500 MG PO TABS: 500.0000 mg | ORAL_TABLET | Freq: Two times a day (BID) | ORAL | Status: DC

## 2015-06-17 NOTE — Telephone Encounter (Signed)
OK. PGs last note had Augmentin.  Cipro 500 mg bid for 10 days Flagyl 500 mg bid for 10 days

## 2015-06-17 NOTE — Telephone Encounter (Signed)
Augmentin 875 mg twice daily for 10 days.  Clear liquid for 1-2 days until feeling better.  Patient to call after completion of therapy to give [place] a condition update. Call if not improving or worsening.

## 2015-06-17 NOTE — Telephone Encounter (Signed)
Patient notified She is aware to decline the augmentin at the pharmacy

## 2015-06-17 NOTE — Telephone Encounter (Signed)
Patient reports LLQ pain, very tender to palpation, pain worse with ambulation, and very gassey.  She has a history of diverticulitis in the past and symptoms are consistent with diverticulitis diagnosis in the past.  Last episode was last November.  Pain has been present for 2 days.  She denies constipation, fever, or diarrhea.  I do not have any APP appts.  [name] please advise

## 2015-06-17 NOTE — Telephone Encounter (Signed)
Patient reports that she is not able to swallow Augmentin, "even if cut in half".  She as prescribed cipro and flagyl last year as a replacement.  Please advise

## 2015-06-19 ENCOUNTER — Other Ambulatory Visit: Payer: Self-pay

## 2015-06-19 DIAGNOSIS — Z1231 Encounter for screening mammogram for malignant neoplasm of breast: Secondary | ICD-10-CM

## 2015-07-16 ENCOUNTER — Other Ambulatory Visit: Payer: Self-pay

## 2015-07-16 DIAGNOSIS — F419 Anxiety disorder, unspecified: Secondary | ICD-10-CM

## 2015-07-16 MED ORDER — ALPRAZOLAM 0.25 MG PO TABS: 0.2500 mg | ORAL_TABLET | Freq: Every evening | ORAL | Status: DC | PRN

## 2015-07-16 NOTE — Telephone Encounter (Signed)
Patient called in requesting refill of Xanax.

## 2015-07-16 NOTE — Telephone Encounter (Signed)
Called to pharmacy as requested

## 2015-07-16 NOTE — Telephone Encounter (Signed)
Okay to refill xanax. 

## 2015-07-20 ENCOUNTER — Other Ambulatory Visit: Payer: Self-pay | Admitting: Gastroenterology

## 2015-07-25 ENCOUNTER — Ambulatory Visit
Admission: RE | Admit: 2015-07-25 | Discharge: 2015-07-25 | Disposition: A | Discharge status: Home / Self Care | Payer: Medicare Other | Source: Ambulatory Visit

## 2015-07-25 DIAGNOSIS — Z1231 Encounter for screening mammogram for malignant neoplasm of breast: Secondary | ICD-10-CM

## 2015-09-08 ENCOUNTER — Telehealth: Payer: Self-pay | Admitting: Gastroenterology

## 2015-09-08 NOTE — Telephone Encounter (Signed)
Patient with right sided abdominal pain.  She has a history of diverticulosis of the sigmoid, ascending and transverse colon.  She reports pain on the right, worse with movement and ambulation.  She is mildly tender on the right.  She will come in and see Arta Bruce, PA tomorrow at [employer]

## 2015-09-09 ENCOUNTER — Encounter: Payer: Self-pay | Admitting: Physician Assistant

## 2015-09-09 ENCOUNTER — Ambulatory Visit (INDEPENDENT_AMBULATORY_CARE_PROVIDER_SITE_OTHER): Payer: Medicare Other | Admitting: Physician Assistant

## 2015-09-09 ENCOUNTER — Other Ambulatory Visit (INDEPENDENT_AMBULATORY_CARE_PROVIDER_SITE_OTHER): Payer: Medicare Other

## 2015-09-09 VITALS — BP 138/60 | HR 72 | Temp 98.6°F | Ht 61.0 in | Wt 158.0 lb

## 2015-09-09 DIAGNOSIS — R1031 Right lower quadrant pain: Secondary | ICD-10-CM

## 2015-09-09 DIAGNOSIS — K219 Gastro-esophageal reflux disease without esophagitis: Secondary | ICD-10-CM | POA: Diagnosis not present

## 2015-09-09 LAB — BASIC METABOLIC PANEL
BUN: 13 mg/dL (ref 6–23)
CO2: 30 mEq/L (ref 19–32)
Calcium: 9.8 mg/dL (ref 8.4–10.5)
Chloride: 102 mEq/L (ref 96–112)
Creatinine, Ser: 0.66 mg/dL (ref 0.40–1.20)
GFR: 93.82 mL/min (ref >60.00)
Glucose, Bld: 140 mg/dL — ABNORMAL HIGH (ref 70–99)
Potassium: 3.9 mEq/L (ref 3.5–5.1)
Sodium: 139 mEq/L (ref 135–145)

## 2015-09-09 LAB — URINALYSIS
Bilirubin Urine: NEGATIVE
Hgb urine dipstick: NEGATIVE
Ketones, ur: NEGATIVE
Leukocytes, UA: NEGATIVE
Nitrite: NEGATIVE
Specific Gravity, Urine: 1.01 (ref 1.000–1.030)
Total Protein, Urine: NEGATIVE
Urine Glucose: NEGATIVE
Urobilinogen, UA: 0.2 (ref 0.0–1.0)
pH: 6 (ref 5.0–8.0)

## 2015-09-09 LAB — CBC WITH DIFFERENTIAL/PLATELET
Basophils Absolute: 0 10*3/uL (ref 0.0–0.1)
Basophils Relative: 0.4 % (ref 0.0–3.0)
Eosinophils Absolute: 0.4 10*3/uL (ref 0.0–0.7)
Eosinophils Relative: 4.4 % (ref 0.0–5.0)
HCT: 43.6 % (ref 36.0–46.0)
Hemoglobin: 14.7 g/dL (ref 12.0–15.0)
Lymphocytes Relative: 29.2 % (ref 12.0–46.0)
Lymphs Abs: 2.3 10*3/uL (ref 0.7–4.0)
MCHC: 33.8 g/dL (ref 30.0–36.0)
MCV: 87.8 fl (ref 78.0–100.0)
Monocytes Absolute: 0.5 10*3/uL (ref 0.1–1.0)
Monocytes Relative: 6.6 % (ref 3.0–12.0)
Neutro Abs: 4.8 10*3/uL (ref 1.4–7.7)
Neutrophils Relative %: 59.4 % (ref 43.0–77.0)
Platelets: 209 10*3/uL (ref 150.0–400.0)
RBC: 4.96 Mil/uL (ref 3.87–5.11)
RDW: 13.6 % (ref 11.5–15.5)
WBC: 8 10*3/uL (ref 4.0–10.5)

## 2015-09-09 LAB — SEDIMENTATION RATE: Sed Rate: 18 mm/hr (ref 0–22)

## 2015-09-09 MED ORDER — METOCLOPRAMIDE HCL 5 MG/5ML PO SOLN: ORAL | Status: DC

## 2015-09-09 NOTE — Progress Notes (Signed)
Patient ID: ERENDIDA [name], female   DOB: 10-23-1946, 69 y.o.   MRN: [id number]     History of Present Illness: Megan [name]  Who is known to Megan [name]. She has a long-standing history of GERD and diverticulosis. She was last seen by Megan [name] in January 2016 at which time she was advised to use her omeprazole twice daily. With this, she has less chest pain. She does however have some nights where she gets nocturnal regurgitation and wakes up with a bad taste on her tongue and a film on her teeth. Further questioning reveals that she typically has 2 or 3 glasses of wine each evening, often right before she goes to bed.    her main reason for today's visit is for evaluation of right lower quadrant abdominal pain of one and a half to 2 days duration. The pain came on insidiously 1-1/2-2 days ago but has become quite bothersome. She describes it as a constant ache with occasional knifelike pains. It does not radiate. Typically when she eats , within 45 minutes of her meal she will have mushy stools that occur 4-5 times after each meal. Today however her stools looked normal and were formed. She states the pain is worse when she rides in a car or when she walks. She has had no associated fever or chills or night sweats and she denies nausea or vomiting. She has had diverticulitis on several occasions but the pain has always been on the left lower quadrant. She states it feels like she has diverticulitis on the right side. Her last colonoscopy was in October 2015 at which time she was noted to have severe diverticulosis in the left colon and mild diverticulosis in the transverse colon and ascending colon. She also had a sessile polyp in the rectum and polypectomy was performed. She denies urinary frequency or dysuria but states her urine has been strong smelling for several days. She has no associated back pain.   Past Medical History  Diagnosis Date  . Coronary artery disease     a. s/p CABG in 1995;  b.  05/2013 Neg MV, EF 82%;  c. 06/2013 Cath: LM nl, LAD 40-50p, LCX nl, RCA 80p/169m, VG->RCA->PDA->PLSA min irregs, LIMA->LAD atretic, vigorous LV fxn.  . Renal artery stenosis ([place])     a. 2008 s/p PTA  . Hypertension   . Hyperlipidemia   . Osteoarthritis   . Fibromyalgia   . Eczema, dyshidrotic   . GERD (gastroesophageal reflux disease)   . AAA (abdominal aortic aneurysm) ([place])     a. s/p stent grafting in 2009.  . Diverticulosis   . Adenomatous colon polyp 05/2001  . Peripheral arterial disease ([facility])     left leg diagnosed by Megan [name]  . Adenomatous duodenal polyp   . Myocardial infarction ([place])   . Anginal pain ([place])   . COPD (chronic obstructive pulmonary disease) ([place])   . Diabetes mellitus without complication [facility])     Past Surgical History  Procedure Laterality Date  . Renal artery stent  2008  . Coronary artery bypass graft  07/1994    Triple by Megan [name]  . Carpal tunnel release  08/1993    left wrist  . Tonsilectomy, adenoidectomy, bilateral myringotomy and tubes  1989  . Total abdominal hysterectomy  1992  . Carpal tunnel release  01/1997    right  . Thyroid cyst apiration  05/1997 and 07/1998  . Cardiovascular stress test  11/11/2009  normal study  . Cardiac catheterization    . Abdominal aortic aneurysm repair  2009    stenting  . Left heart catheterization with coronary/graft angiogram N/A 06/11/2013    Procedure: LEFT HEART CATHETERIZATION WITH [name];  Surgeon: Megan Headings, MD;  [facility];  Service: Cardiovascular;  Laterality: N/A;  . Abdominal aortagram N/A 03/06/2014    Procedure: ABDOMINAL AORTAGRAM;  Surgeon: Megan Posner, MD;  Location: [facility] CATH LAB;  Service: Cardiovascular;  Laterality: N/A;   Family History  Problem Relation Age of Onset  . Heart disease Mother   . Heart attack Mother   . Heart disease Father     before age 82  . Heart attack Father   . Heart disease Sister     before age 64  . Diabetes  Sister   . Hyperlipidemia Sister   . Hypertension Sister   . Heart attack Sister   . Heart disease Brother   . Diabetes Brother   . Hyperlipidemia Brother   . Hypertension Brother   . Heart attack Brother   . AAA (abdominal aortic aneurysm) Brother   . Colon cancer Paternal Grandmother   . Pancreatic cancer Neg Hx   . Rectal cancer Neg Hx   . Stomach cancer Neg Hx   . Esophageal cancer Neg Hx   . Liver cancer Sister    Social History  Substance Use Topics  . Smoking status: Former Smoker    Types: Cigarettes    Quit date: 12/07/1995  . Smokeless tobacco: Never Used  . Alcohol Use: 3.6 - 6.0 oz/week    6-10 Glasses of wine per week     Comment: 8 drinks per wwek   Current Outpatient Prescriptions  Medication Sig Dispense Refill  . ACCU-CHEK FASTCLIX LANCETS MISC   6  . ALPRAZolam (XANAX) 0.25 MG tablet Take 1 tablet (0.25 mg total) by mouth at bedtime as needed for anxiety. 90 tablet 1  . amLODipine (NORVASC) 5 MG tablet TAKE 1 TABLET (5 MG TOTAL) BY MOUTH DAILY. 30 tablet 11  . aspirin EC 325 MG tablet Take 325 mg by mouth at bedtime.    [name] atorvastatin (LIPITOR) 40 MG tablet TAKE 1 TABLET (40 MG TOTAL) BY MOUTH DAILY. 30 tablet 11  . cycloSPORINE (RESTASIS) 0.05 % ophthalmic emulsion Place 1 drop into both eyes at bedtime.    . diclofenac sodium (VOLTAREN) 1 % GEL Apply 1 application topically 2 (two) times daily as needed (arthritis pain).    . hydrALAZINE (APRESOLINE) 10 MG tablet Take 10 mg by mouth 3 (three) times daily.    [name] KLOR-CON 10 10 MEQ tablet TAKE 2 TABLET TWICE A DAY 180 tablet 3  . losartan-hydrochlorothiazide (HYZAAR) 100-25 MG per tablet TAKE 1 TABLET BY MOUTH DAILY. 90 tablet 3  . Magnesium 250 MG TABS Take 1 tablet by mouth daily.    . metFORMIN (GLUCOPHAGE) 500 MG tablet Take 500 mg by mouth at bedtime.     . nitroGLYCERIN (NITROSTAT) 0.4 MG SL tablet Place 1 tablet (0.4 mg total) under the tongue every 5 (five) minutes as needed for chest pain. 25 tablet  PRN  . Omega-3 Fatty Acids (FISH OIL) 1000 MG CAPS Take 2,000 mg by mouth 2 (two) times daily.    [name] omeprazole (PRILOSEC) 40 MG capsule TAKE 1 CAPSULE (40 MG TOTAL) BY MOUTH 2 (TWO) TIMES DAILY. 60 capsule 2  . potassium chloride (K-DUR) 10 MEQ tablet Take 20 mEq by mouth 2 (two)  times daily.    [name] PRENATAL VITAMINS PO Take 1 tablet by mouth at bedtime.     [name] PROAIR HFA 108 (90 BASE) MCG/ACT inhaler Inhale 1 puff into the lungs every 4 (four) hours as needed for wheezing or shortness of breath.   1  . Probiotic Product (PROBIOTIC DAILY PO) Take 1 capsule by mouth daily.     [name] torsemide (DEMADEX) 20 MG tablet Take 10 mg by mouth daily as needed (for fluid).    . tretinoin (RETIN-A) 0.1 % cream Apply 1 application topically every other day.    . metoCLOPramide (REGLAN) 5 MG/5ML solution Take 1 tsp at bedtime 150 mL 0   No current facility-administered medications for this visit.   Allergies  Allergen Reactions  . Plavix  [Clopidogrel Bisulfate] Itching and Other (See Comments)    Hands and feet tingle and itch.  . Anticoagulant Compound Itching and Other (See Comments)    Hands and feet tingle and itch.  Darlin Coco [Valdecoxib] Other (See Comments)    Increased lft's  . Doxycycline Diarrhea    N/v/d  . Lipitor [Atorvastatin] Other (See Comments)    Leg weakness  . Lisinopril Cough  . Metoprolol Other (See Comments)    Hair loss  . Mevacor [Lovastatin] Other (See Comments)    Muscle weakness  . Morphine And Related Nausea Only  . Plavix [Clopidogrel Bisulfate] Other (See Comments)    Itching of hands and feet  . Zocor [Simvastatin]     Bones hurt   . Codeine Itching and Rash    Hyper-active  . Hydrocod Polst-Cpm Polst Er Rash     Review of Systems: Gen: Denies any fever, chills, sweats, anorexia, fatigue, weakness, malaise, weight loss, and sleep disorder CV: Denies chest pain, angina, palpitations, syncope, orthopnea, PND, peripheral edema, and claudication. Resp: Denies  dyspnea at rest, dyspnea with exercise, cough, sputum, wheezing, coughing up blood, and pleurisy. GI: Denies vomiting blood, jaundice, and fecal incontinence.   Denies dysphagia or odynophagia. GU : Denies urinary burning, blood in urine, urinary frequency, urinary hesitancy, nocturnal urination, and urinary incontinence. MS: Denies joint pain, limitation of movement, and swelling, stiffness, low back pain, extremity pain. Denies muscle weakness, cramps, atrophy.  Derm: Denies rash, itching, dry skin, hives, moles, warts, or unhealing ulcers.  Psych: Denies depression, anxiety, memory loss, suicidal ideation, hallucinations, paranoia, and confusion. Heme: Denies bruising, bleeding, and enlarged lymph nodes. Neuro:  Denies any headaches, dizziness, paresthesia Endo:  Denies any problems with DM, thyroid, adrenal    Physical Exam: BP 138/60 mmHg  Pulse 72  Ht 5\' 1"  (1.549 m)  Wt 158 lb (71.668 kg)  BMI 29.87 kg/m2 General: Pleasant, well developed ,  Caucasianfemale in no acute distress Head: Normocephalic and atraumatic Eyes:  sclerae anicteric, conjunctiva pink  Ears: Normal auditory acuity Lungs: Clear throughout to auscultation Heart: Regular rate and rhythm Abdomen: Soft, non distended,  Tender to palpation in the right lower quadrant and right suprapubic area with guarding but no rebound No masses, no hepatomegaly. Normal bowel sounds.  No CVAT. Musculoskeletal: Symmetrical with no gross deformities  Extremities: No edema  Neurological: Alert oriented x 4, grossly nonfocal Psychological:  Alert and cooperative. Normal mood and affect  Assessment and Recommendations:  #1. GERD and antireflux regimen has been reviewed. She's been advised to decrease the amount of lying she has at night and to not drink anything for 2-3 hours before she goes to bed. She will be given and then. Trial of  Reglan syrup, 5 mg per 5 ML's, 1 teaspoon 20 minutes before bedtime. She has been advised of the  possible side effects of Reglan and was instructed to discontinue the medication immediately if she develops tremor, shakiness, jittery mass, etc. She will use it each night for 1 week. If it has not provided relief , she will discontinue its use. If it has provided relief, she will use it at bedtime on an as-needed basis.    #2. Right lower quadrant pain. Patient presents with a 2 day history of right lower quadrant pain that is increasing in intensity Henrene Pastor she is leaving on vacation this week and and is concerned about getting worse while out of town. A urinalysis and CBC with differential will be obtained today. She has been scheduled for a CT of the abdomen and pelvis tomorrow morning. Further recommendations will be made pending the findings of the above.        Sritha Chauncey, Vita Barley PA-C 09/09/2015,

## 2015-09-09 NOTE — Patient Instructions (Addendum)
Your physician has requested that you go to the basement for lab work before leaving today.  We have sent the following medications to your pharmacy for you to pick up at your convenience:reglan.  You have been scheduled for a CT scan of the abdomen and pelvis at [facility] ([address]---this is in the same building as [facility]).   You are scheduled on 09/10/15 at 10:00am. You should arrive 15 minutes prior to your appointment time for registration. Please follow the written instructions below on the day of your exam:  WARNING: IF YOU ARE ALLERGIC TO IODINE/X-RAY DYE, PLEASE NOTIFY RADIOLOGY IMMEDIATELY AT [phone]! YOU WILL BE GIVEN A 13 HOUR PREMEDICATION PREP.  1) Do not eat or drink anything after 6:00am (4 hours prior to your test) 2) You have been given 2 bottles of oral contrast to drink. The solution may taste better if refrigerated, but do NOT add ice or any other liquid to this solution. Shake well before drinking.    Drink 1 bottle of contrast @ 8:00am (2 hours prior to your exam)  Drink 1 bottle of contrast @ 9:00am (1 hour prior to your exam)  You may take any medications as prescribed with a small amount of water except for the following: Metformin, Glucophage, Glucovance, Avandamet, Riomet, Fortamet, Actoplus Met, Janumet, Glumetza or Metaglip. The above medications must be held the day of the exam AND 48 hours after the exam.  The purpose of you drinking the oral contrast is to aid in the visualization of your intestinal tract. The contrast solution may cause some diarrhea. Before your exam is started, you will be given a small amount of fluid to drink. Depending on your individual set of symptoms, you may also receive an intravenous injection of x-ray contrast/dye. Plan on being at [facility] for 30 minutes or long, depending on the type of exam you are having performed.  This test typically takes 30-45 minutes to complete.  If you have  any questions regarding your exam or if you need to reschedule, you may call the CT department at [phone] between the hours of 8:00 am and 5:00 pm, Monday-Friday.  ________________________________________________________________________    

## 2015-09-09 NOTE — Progress Notes (Signed)
Reviewed and agree with management plan.  Vernis Cabacungan T. Ardit Danh, MD FACG 

## 2015-09-10 ENCOUNTER — Other Ambulatory Visit: Payer: Self-pay | Admitting: *Deleted

## 2015-09-10 ENCOUNTER — Telehealth: Payer: Self-pay | Admitting: *Deleted

## 2015-09-10 ENCOUNTER — Ambulatory Visit (INDEPENDENT_AMBULATORY_CARE_PROVIDER_SITE_OTHER)
Admission: RE | Admit: 2015-09-10 | Discharge: 2015-09-10 | Disposition: A | Discharge status: Home / Self Care | Payer: Medicare Other | Source: Ambulatory Visit | Attending: Physician Assistant | Admitting: Physician Assistant

## 2015-09-10 DIAGNOSIS — R1031 Right lower quadrant pain: Secondary | ICD-10-CM | POA: Diagnosis not present

## 2015-09-10 MED ORDER — SACCHAROMYCES BOULARDII 250 MG PO CAPS: ORAL_CAPSULE | ORAL | Status: DC

## 2015-09-10 MED ORDER — CIPROFLOXACIN HCL 500 MG PO TABS: ORAL_TABLET | ORAL | Status: DC

## 2015-09-10 MED ORDER — METRONIDAZOLE 500 MG PO TABS: ORAL_TABLET | ORAL | Status: DC

## 2015-09-10 NOTE — Telephone Encounter (Signed)
Spoke with patient and the other antibiotic that could be used may cause diarrhea. She wants to stay on the Flagyl.

## 2015-09-10 NOTE — Telephone Encounter (Signed)
Patient is calling to see if Cecille Rubin Hvozdovic, PA-C can give her something besides Flagyl to take with Cipro. She states it tastes nasty and she cannot have a glass of wine while taking it. Please, advise.

## 2015-09-11 ENCOUNTER — Encounter: Payer: Self-pay | Admitting: Gastroenterology

## 2015-10-06 ENCOUNTER — Telehealth: Payer: Self-pay | Admitting: Physician Assistant

## 2015-10-06 NOTE — Telephone Encounter (Signed)
CT was done to look for diverticulitis. Her hepatic steatosis is likely not new. Treatment is weight loss, watch diet. Can follow with PCP for that.

## 2015-10-06 NOTE — Telephone Encounter (Signed)
Patient called regarding her CT scan on 09-10-15. She states that she was reading the results from [facility] and wanted to know if she would need to follow up with her PCP on any of the other findings. Please advise.

## 2015-10-07 ENCOUNTER — Encounter: Payer: Self-pay | Admitting: Gastroenterology

## 2015-10-07 NOTE — Telephone Encounter (Signed)
Patient is notified. Per her request a copy of the report is faxed to her PCP Dr Any at [facility] (f) [phone].

## 2015-10-07 NOTE — Telephone Encounter (Signed)
I have left message for the patient to call back 

## 2015-10-14 ENCOUNTER — Other Ambulatory Visit: Payer: Self-pay | Admitting: Gastroenterology

## 2015-10-15 ENCOUNTER — Other Ambulatory Visit (INDEPENDENT_AMBULATORY_CARE_PROVIDER_SITE_OTHER): Payer: Medicare Other | Admitting: *Deleted

## 2015-10-15 DIAGNOSIS — I119 Hypertensive heart disease without heart failure: Secondary | ICD-10-CM

## 2015-10-15 DIAGNOSIS — E119 Type 2 diabetes mellitus without complications: Secondary | ICD-10-CM

## 2015-10-15 DIAGNOSIS — E785 Hyperlipidemia, unspecified: Secondary | ICD-10-CM

## 2015-10-15 DIAGNOSIS — M791 Myalgia, unspecified site: Secondary | ICD-10-CM

## 2015-10-15 LAB — LIPID PANEL
Cholesterol: 158 mg/dL (ref 125–200)
HDL: 48 mg/dL (ref >=46)
LDL Cholesterol: 61 mg/dL (ref <130)
Total CHOL/HDL Ratio: 3.3 Ratio (ref <=5.0)
Triglycerides: 245 mg/dL — ABNORMAL HIGH (ref <150)
VLDL: 49 mg/dL — ABNORMAL HIGH (ref <30)

## 2015-10-15 LAB — HEPATIC FUNCTION PANEL
ALT: 43 U/L — ABNORMAL HIGH (ref 6–29)
AST: 39 U/L — ABNORMAL HIGH (ref 10–35)
Albumin: 3.9 g/dL (ref 3.6–5.1)
Alkaline Phosphatase: 69 U/L (ref 33–130)
Bilirubin, Direct: 0.1 mg/dL (ref <=0.2)
Indirect Bilirubin: 0.5 mg/dL (ref 0.2–1.2)
Total Bilirubin: 0.6 mg/dL (ref 0.2–1.2)
Total Protein: 6.3 g/dL (ref 6.1–8.1)

## 2015-10-15 LAB — HEMOGLOBIN A1C
Hgb A1c MFr Bld: 5.9 % — ABNORMAL HIGH (ref <5.7)
Mean Plasma Glucose: 123 mg/dL — ABNORMAL HIGH (ref <117)

## 2015-10-15 LAB — CBC WITH DIFFERENTIAL/PLATELET
Basophils Absolute: 0 10*3/uL (ref 0.0–0.1)
Basophils Relative: 0 % (ref 0–1)
Eosinophils Absolute: 0.2 10*3/uL (ref 0.0–0.7)
Eosinophils Relative: 3 % (ref 0–5)
HCT: 43.1 % (ref 36.0–46.0)
Hemoglobin: 15.1 g/dL — ABNORMAL HIGH (ref 12.0–15.0)
Lymphocytes Relative: 33 % (ref 12–46)
Lymphs Abs: 2.5 10*3/uL (ref 0.7–4.0)
MCH: 29.8 pg (ref 26.0–34.0)
MCHC: 35 g/dL (ref 30.0–36.0)
MCV: 85.2 fL (ref 78.0–100.0)
MPV: 11 fL (ref 8.6–12.4)
Monocytes Absolute: 0.5 10*3/uL (ref 0.1–1.0)
Monocytes Relative: 6 % (ref 3–12)
Neutro Abs: 4.5 10*3/uL (ref 1.7–7.7)
Neutrophils Relative %: 58 % (ref 43–77)
Platelets: 192 10*3/uL (ref 150–400)
RBC: 5.06 MIL/uL (ref 3.87–5.11)
RDW: 13.5 % (ref 11.5–15.5)
WBC: 7.7 10*3/uL (ref 4.0–10.5)

## 2015-10-15 LAB — BASIC METABOLIC PANEL
BUN: 14 mg/dL (ref 7–25)
CO2: 28 mmol/L (ref 20–31)
Calcium: 9.6 mg/dL (ref 8.6–10.4)
Chloride: 104 mmol/L (ref 98–110)
Creat: 0.77 mg/dL (ref 0.60–0.93)
Glucose, Bld: 107 mg/dL — ABNORMAL HIGH (ref 65–99)
Potassium: 4 mmol/L (ref 3.5–5.3)
Sodium: 141 mmol/L (ref 135–146)

## 2015-10-15 LAB — CK: Total CK: 31 U/L (ref 7–177)

## 2015-10-15 LAB — MAGNESIUM: Magnesium: 1.9 mg/dL (ref 1.5–2.5)

## 2015-10-15 NOTE — Addendum Note (Signed)
Addended by: Eulis Foster on: 10/15/2015 08:08 AM   Modules accepted: Orders

## 2015-10-15 NOTE — Addendum Note (Signed)
Addended by: Eulis Foster on: 10/15/2015 08:11 AM   Modules accepted: Orders

## 2015-10-16 NOTE — Progress Notes (Signed)
Quick Note:  Please make copy of labs for patient visit. ______ 

## 2015-10-20 ENCOUNTER — Telehealth: Payer: Self-pay | Admitting: Cardiology

## 2015-10-20 ENCOUNTER — Ambulatory Visit (INDEPENDENT_AMBULATORY_CARE_PROVIDER_SITE_OTHER): Payer: Medicare Other | Admitting: Cardiology

## 2015-10-20 ENCOUNTER — Encounter: Payer: Self-pay | Admitting: Cardiology

## 2015-10-20 VITALS — BP 140/70 | HR 70 | Ht 61.5 in | Wt 155.4 lb

## 2015-10-20 DIAGNOSIS — E785 Hyperlipidemia, unspecified: Secondary | ICD-10-CM

## 2015-10-20 DIAGNOSIS — I259 Chronic ischemic heart disease, unspecified: Secondary | ICD-10-CM | POA: Diagnosis not present

## 2015-10-20 DIAGNOSIS — I119 Hypertensive heart disease without heart failure: Secondary | ICD-10-CM

## 2015-10-20 NOTE — Telephone Encounter (Signed)
New Message  Pt was seen earlier today in the office- has additional question and did not get to ask. Please call back and discuss.

## 2015-10-20 NOTE — Telephone Encounter (Signed)
Advised patient, verbalized understanding  

## 2015-10-20 NOTE — Progress Notes (Signed)
Cardiology Office Note   Date:  10/20/2015   ID:  Megan [name], DOB [birth year], MRN [id number]  PCP:  Megan Arnt, MD  Cardiologist: Megan Coco MD  Chief Complaint  Patient presents with  . Hypertension      History of Present Illness: Megan [name] is a 69 y.o. female who presents for a four-month follow-up visit  She has a past history of essential hypertension and a past history of coronary artery disease. Also has a history of dyslipidemia. She is status post coronary artery bypass graft surgery in 1995. She had a renal artery stent for renal artery stenosis in 2008. She had a stent graft of an abdominal aortic aneurysm 2009. She has had chronic mild elevation of liver function enzymes secondary to modest alcohol intake and to fatty liver. In April 2013 she was hospitalized briefly Megan [name] for dizziness and was found to be hypokalemic. She has been weaned off her Demadex and off for her potassium and has had no further episodes of dizziness. Her blood pressure is followed closely by her primary care provider. Since her last office visit she was hospitalized in July 2014 and had cardiac catheterization which showed that her grafts were open. There was no evidence of myocardial damage. She was subsequently sent to her gastroenterologist Megan. Fuller [name] who increased her Prilosec from 20 mg to 40 mg daily and her chest discomfort has resolved.  She was hospitalized 11/15/2014 with which he describes as an aching sensation in her chest. She was admitted. She ruled out by enzymes. She underwent a treadmill Myoview on 11/17/14 which showed no ischemia and her ejection fraction was 86%. Since last visit she has had no new cardiac symptoms. She has been doing some kayaking which she enjoys. She has had some chronic exertional dyspnea climbing hills.  She has not been experiencing any palpitations dizziness or syncope.  Her weight is down 3 pounds since last visit.  Past  Medical History  Diagnosis Date  . Coronary artery disease     a. s/p CABG in 1995;  b. 05/2013 Neg MV, EF 82%;  c. 06/2013 Cath: LM nl, LAD 40-50p, LCX nl, RCA 80p/139m, VG->RCA->PDA->PLSA min irregs, LIMA->LAD atretic, vigorous LV fxn.  . Renal artery stenosis ([place])     a. 2008 s/p PTA  . Hypertension   . Hyperlipidemia   . Osteoarthritis   . Fibromyalgia   . Eczema, dyshidrotic   . GERD (gastroesophageal reflux disease)   . AAA (abdominal aortic aneurysm) ([place])     a. s/p stent grafting in 2009.  . Diverticulosis   . Adenomatous colon polyp 05/2001  . Peripheral arterial disease (HCC)     left leg diagnosed by Megan [name]  . Adenomatous duodenal polyp   . Myocardial infarction ([place])   . Anginal pain ([place])   . COPD (chronic obstructive pulmonary disease) ([place])   . Diabetes mellitus without complication [name])     Past Surgical History  Procedure Laterality Date  . Renal artery stent  2008  . Coronary artery bypass graft  07/1994    Triple by Megan [name]  . Carpal tunnel release  08/1993    left wrist  . Tonsilectomy, adenoidectomy, bilateral myringotomy and tubes  1989  . Total abdominal hysterectomy  1992  . Carpal tunnel release  01/1997    right  . Thyroid cyst apiration  05/1997 and 07/1998  . Cardiovascular stress test  11/11/2009    normal study  . Cardiac  catheterization    . Abdominal aortic aneurysm repair  2009    stenting  . Left heart catheterization with coronary/graft angiogram N/A 06/11/2013    Procedure: LEFT HEART CATHETERIZATION WITH [name];  Surgeon: Megan Headings, MD;  Location: [name];  Service: Cardiovascular;  Laterality: N/A;  . Abdominal aortagram N/A 03/06/2014    Procedure: ABDOMINAL AORTAGRAM;  Surgeon: Megan Posner, MD;  Location: [name];  Service: Cardiovascular;  Laterality: N/A;     Current Outpatient Prescriptions  Medication Sig Dispense Refill  . ACCU-CHEK FASTCLIX LANCETS MISC   6  . ALPRAZolam (XANAX) 0.25  MG tablet Take 1 tablet (0.25 mg total) by mouth at bedtime as needed for anxiety. 90 tablet 1  . amLODipine (NORVASC) 5 MG tablet TAKE 1 TABLET (5 MG TOTAL) BY MOUTH DAILY. 30 tablet 11  . aspirin EC 325 MG tablet Take 325 mg by mouth at bedtime.    [name] atorvastatin (LIPITOR) 40 MG tablet TAKE 1 TABLET (40 MG TOTAL) BY MOUTH DAILY. 30 tablet 11  . augmented betamethasone dipropionate (DIPROLENE-AF) 0.05 % cream Apply 1 application topically daily as needed. For hands  1  . cycloSPORINE (RESTASIS) 0.05 % ophthalmic emulsion Place 1 drop into both eyes at bedtime.    . diclofenac sodium (VOLTAREN) 1 % GEL Apply 1 application topically 2 (two) times daily as needed (arthritis pain).    . hydrALAZINE (APRESOLINE) 10 MG tablet Take 10 mg by mouth 3 (three) times daily.    [name] KLOR-CON 10 10 MEQ tablet TAKE 2 TABLET TWICE A DAY 180 tablet 3  . losartan-hydrochlorothiazide (HYZAAR) 100-25 MG per tablet TAKE 1 TABLET BY MOUTH DAILY. 90 tablet 3  . metFORMIN (GLUCOPHAGE) 500 MG tablet Take 500 mg by mouth at bedtime.     . metoCLOPramide (REGLAN) 5 MG/5ML solution Take 1 tsp at bedtime 150 mL 0  . nitroGLYCERIN (NITROSTAT) 0.4 MG SL tablet Place 1 tablet (0.4 mg total) under the tongue every 5 (five) minutes as needed for chest pain. 25 tablet PRN  . Omega-3 Fatty Acids (FISH OIL) 1000 MG CAPS Take 2,000 mg by mouth 2 (two) times daily.    [name] omeprazole (PRILOSEC) 40 MG capsule TAKE 1 CAPSULE (40 MG TOTAL) BY MOUTH 2 (TWO) TIMES DAILY. 60 capsule 2  . PRENATAL VITAMINS PO Take 1 tablet by mouth at bedtime.     [name] PROAIR HFA 108 (90 BASE) MCG/ACT inhaler Inhale 1 puff into the lungs every 4 (four) hours as needed for wheezing or shortness of breath.   1  . Probiotic Product (PROBIOTIC DAILY PO) Take 1 capsule by mouth daily.     [name] torsemide (DEMADEX) 20 MG tablet Take 10 mg by mouth daily as needed (for fluid).    . tretinoin (RETIN-A) 0.1 % cream Apply 1 application topically every other day.     No current  facility-administered medications for this visit.    Allergies:   Plavix ; Anticoagulant compound; Bextra; Doxycycline; Lipitor; Lisinopril; Metoprolol; Mevacor; Morphine and related; Plavix; Zocor; Codeine; and Hydrocod polst-cpm polst er    Social History:  The patient  reports that she quit smoking about 19 years ago. Her smoking use included Cigarettes. She has never used smokeless tobacco. She reports that she drinks about 3.6 - 6.0 oz of alcohol per week. She reports that she does not use illicit drugs.   Family History:  The patient's family history includes AAA (abdominal aortic aneurysm) in her brother; Colon cancer  in her paternal grandmother; Diabetes in her brother and sister; Heart attack in her brother, father, mother, and sister; Heart disease in her brother, father, mother, and sister; Hyperlipidemia in her brother and sister; Hypertension in her brother and sister; Liver cancer in her sister. There is no history of Pancreatic cancer, Rectal cancer, Stomach cancer, or Esophageal cancer.    ROS:  Please see the history of present illness.   Otherwise, review of systems are positive for none.   All other systems are reviewed and negative.    PHYSICAL EXAM: VS:  BP 140/70 mmHg  Pulse 70  Ht 5' 1.5" (1.562 m)  Wt 155 lb 6.4 oz (70.489 kg)  BMI 28.89 kg/m2 , BMI Body mass index is 28.89 kg/(m^2). GEN: Well nourished, well developed, in no acute distress HEENT: normal Neck: no JVD, carotid bruits, or masses Cardiac: RRR; no murmurs, rubs, or gallops,no edema  Respiratory:  clear to auscultation bilaterally, normal work of breathing GI: soft, nontender, nondistended, + BS MS: no deformity or atrophy Skin: warm and dry, no rash Neuro:  Strength and sensation are intact Psych: euthymic mood, full affect   EKG:  EKG is not ordered today.    Recent Labs: 11/16/2014: TSH 2.950 10/15/2015: ALT 43*; BUN 14; Creat 0.77; Hemoglobin 15.1*; Magnesium 1.9; Platelets 192; Potassium  4.0; Sodium 141    Lipid Panel    Component Value Date/Time   CHOL 158 10/15/2015 0811   TRIG 245* 10/15/2015 0811   HDL 48 10/15/2015 0811   CHOLHDL 3.3 10/15/2015 0811   VLDL 49* 10/15/2015 0811   LDLCALC 61 10/15/2015 0811   LDLDIRECT 87.4 12/09/2014 0808      Wt Readings from Last 3 Encounters:  10/20/15 155 lb 6.4 oz (70.489 kg)  09/09/15 158 lb (71.668 kg)  05/08/15 159 lb (72.122 kg)        ASSESSMENT AND [name]:  1. ischemic heart disease status post CABG in 1995. This recent catheter July 2014 showed no recurrent stenoses. Most recent treadmill Myoview 11/17/14 normal with ejection fraction 86% 2. hypertensive heart disease 3. Hypercholesterolemia 4. abnormal liver function studies.  Needs to continue to limit alcohol 5. status post stent to right renal artery 2008 6. status post stent graft to abdominal aortic aneurysm 2009 7. peripheral artery disease 8. chronic low back pain followed by Megan. Nelva Bush 9. mild diabetes mellitus type 2   Current medicines are reviewed at length with the patient today.  The patient does not have concerns regarding medicines.  The following changes have been made:  no change  Labs/ tests ordered today include:  No orders of the defined types were placed in this encounter.    Disposition: Continue current medication.  Recheck in 4 months for follow-up office visit and fasting lipid panel hepatic function panel and basal metabolic panel with Megan. [name].   Berna Spare MD 10/20/2015 10:37 AM    [address], [place], [place]  [address] Phone: [phone]; Fax: [phone]

## 2015-10-20 NOTE — Patient Instructions (Signed)
Medication Instructions:  Your physician recommends that you continue on your current medications as directed. Please refer to the Current Medication list given to you today.  Labwork: none  Testing/Procedures: none  Follow-Up: Your physician recommends that you schedule a follow-up appointment in: 4 months with fasting labs (lp/bmet/hfp) with Dr Martinique   If you need a refill on your cardiac medications before your next appointment, please call your pharmacy.

## 2015-10-20 NOTE — Telephone Encounter (Signed)
Spoke with patient and she forgot to ask  Dr. Mare Ferrari 3 questions today: 1) Can she d/c her magnesium? 2) Can she d/c her vitamin D 3) Can she decrease her potassium from 4 tablets daily to 2 tablets daily  Will forward to  Dr. Mare Ferrari for review

## 2015-10-20 NOTE — Telephone Encounter (Signed)
Okay to stop her magnesium and her vitamin D.  She should continue her potassium at 4 tablets a day since her potassium level is only 4.0

## 2015-12-08 ENCOUNTER — Other Ambulatory Visit: Payer: Self-pay | Admitting: Cardiology

## 2016-02-25 ENCOUNTER — Ambulatory Visit (INDEPENDENT_AMBULATORY_CARE_PROVIDER_SITE_OTHER): Payer: Medicare Other | Admitting: Cardiology

## 2016-02-25 ENCOUNTER — Encounter: Payer: Self-pay | Admitting: Cardiology

## 2016-02-25 VITALS — BP 144/88 | HR 70 | Ht 61.0 in | Wt 147.9 lb

## 2016-02-25 DIAGNOSIS — I25810 Atherosclerosis of coronary artery bypass graft(s) without angina pectoris: Secondary | ICD-10-CM | POA: Diagnosis not present

## 2016-02-25 DIAGNOSIS — I701 Atherosclerosis of renal artery: Secondary | ICD-10-CM

## 2016-02-25 DIAGNOSIS — E785 Hyperlipidemia, unspecified: Secondary | ICD-10-CM

## 2016-02-25 DIAGNOSIS — Z87448 Personal history of other diseases of urinary system: Secondary | ICD-10-CM

## 2016-02-25 DIAGNOSIS — I119 Hypertensive heart disease without heart failure: Secondary | ICD-10-CM

## 2016-02-25 DIAGNOSIS — Z8679 Personal history of other diseases of the circulatory system: Secondary | ICD-10-CM

## 2016-02-25 LAB — BASIC METABOLIC PANEL
BUN: 16 mg/dL (ref 7–25)
CO2: 29 mmol/L (ref 20–31)
Calcium: 10.2 mg/dL (ref 8.6–10.4)
Chloride: 98 mmol/L (ref 98–110)
Creat: 0.78 mg/dL (ref 0.60–0.93)
Glucose, Bld: 109 mg/dL — ABNORMAL HIGH (ref 65–99)
Potassium: 4.2 mmol/L (ref 3.5–5.3)
Sodium: 140 mmol/L (ref 135–146)

## 2016-02-25 LAB — HEPATIC FUNCTION PANEL
ALT: 44 U/L — ABNORMAL HIGH (ref 6–29)
AST: 36 U/L — ABNORMAL HIGH (ref 10–35)
Albumin: 4.5 g/dL (ref 3.6–5.1)
Alkaline Phosphatase: 79 U/L (ref 33–130)
Bilirubin, Direct: 0.2 mg/dL (ref <=0.2)
Indirect Bilirubin: 0.8 mg/dL (ref 0.2–1.2)
Total Bilirubin: 1 mg/dL (ref 0.2–1.2)
Total Protein: 6.8 g/dL (ref 6.1–8.1)

## 2016-02-25 LAB — CBC WITH DIFFERENTIAL/PLATELET
Basophils Absolute: 0 10*3/uL (ref 0.0–0.1)
Basophils Relative: 0 % (ref 0–1)
Eosinophils Absolute: 0.2 10*3/uL (ref 0.0–0.7)
Eosinophils Relative: 2 % (ref 0–5)
HCT: 47.7 % — ABNORMAL HIGH (ref 36.0–46.0)
Hemoglobin: 16.2 g/dL — ABNORMAL HIGH (ref 12.0–15.0)
Lymphocytes Relative: 33 % (ref 12–46)
Lymphs Abs: 2.7 10*3/uL (ref 0.7–4.0)
MCH: 29.9 pg (ref 26.0–34.0)
MCHC: 34 g/dL (ref 30.0–36.0)
MCV: 88.2 fL (ref 78.0–100.0)
MPV: 11.6 fL (ref 8.6–12.4)
Monocytes Absolute: 0.5 10*3/uL (ref 0.1–1.0)
Monocytes Relative: 6 % (ref 3–12)
Neutro Abs: 4.8 10*3/uL (ref 1.7–7.7)
Neutrophils Relative %: 59 % (ref 43–77)
Platelets: 220 10*3/uL (ref 150–400)
RBC: 5.41 MIL/uL — ABNORMAL HIGH (ref 3.87–5.11)
RDW: 13.5 % (ref 11.5–15.5)
WBC: 8.2 10*3/uL (ref 4.0–10.5)

## 2016-02-25 LAB — LIPID PANEL
Cholesterol: 188 mg/dL (ref 125–200)
HDL: 57 mg/dL (ref >=46)
LDL Cholesterol: 87 mg/dL (ref <130)
Total CHOL/HDL Ratio: 3.3 Ratio (ref <=5.0)
Triglycerides: 218 mg/dL — ABNORMAL HIGH (ref <150)
VLDL: 44 mg/dL — ABNORMAL HIGH (ref <30)

## 2016-02-25 NOTE — Progress Notes (Signed)
Cardiology Office Note   Date:  02/25/2016   ID:  Megan, [name] 04/21/1946, MRN [id number]  PCP:  [name], MD  Cardiologist: Shama Monfils Martinique MD  Chief Complaint  Patient presents with  . Follow-up    pt c/o some swelling in legs       History of Present Illness: Megan [name] is a 70 y.o. female who is seen for cardiac follow up. She is a former patient of Dr. Mare Ferrari.   She has a past history of essential hypertension and  coronary artery disease. Also has a history of dyslipidemia. She is status post coronary artery bypass graft surgery in 1995. She had a renal artery stent for renal artery stenosis in 2008. She had a stent graft of an abdominal aortic aneurysm 2009. This past year she had stenting of the left iliac. She is followed by [name] for PAD. She has had chronic mild elevation of liver function enzymes secondary to modest alcohol intake and to fatty liver.   She was hospitalized in July 2014 and had cardiac catheterization which showed that her grafts were open. There was no evidence of myocardial damage. She was hospitalized 11/15/2014 with which he describes as an aching sensation in her chest. She was admitted. She ruled out by enzymes. She underwent a treadmill Myoview on 11/17/14 which showed no ischemia and her ejection fraction was 86%. On follow up today she is doing well. She has had some chronic exertional dyspnea climbing hills.  She has not been experiencing any palpitations dizziness or syncope.  No chest pain. Walks at least a mile/day. Her weight is down 11 pounds on a Weight watchers program.   Past Medical History  Diagnosis Date  . Coronary artery disease     a. s/p CABG in 1995;  b. 05/2013 Neg MV, EF 82%;  c. 06/2013 Cath: LM nl, LAD 40-50p, LCX nl, RCA 80p/186m, VG->RCA->PDA->PLSA min irregs, LIMA->LAD atretic, vigorous LV fxn.  . Renal artery stenosis ([place])     a. 2008 s/p PTA  . Hypertension   . Hyperlipidemia   . Osteoarthritis     . Fibromyalgia   . Eczema, dyshidrotic   . GERD (gastroesophageal reflux disease)   . AAA (abdominal aortic aneurysm) ([place])     a. s/p stent grafting in 2009.  . Diverticulosis   . Adenomatous colon polyp 05/2001  . Peripheral arterial disease (HCC)     left leg diagnosed by Dr Mare Ferrari  . Adenomatous duodenal polyp   . Myocardial infarction ([place])   . Anginal pain ([place])   . COPD (chronic obstructive pulmonary disease) ([place])   . Diabetes mellitus without complication [facility])     Past Surgical History  Procedure Laterality Date  . Renal artery stent  2008  . Coronary artery bypass graft  07/1994    Triple by Dr Ceasar Mons  . Carpal tunnel release  08/1993    left wrist  . Tonsilectomy, adenoidectomy, bilateral myringotomy and tubes  1989  . Total abdominal hysterectomy  1992  . Carpal tunnel release  01/1997    right  . Thyroid cyst apiration  05/1997 and 07/1998  . Cardiovascular stress test  11/11/2009    normal study  . Cardiac catheterization    . Abdominal aortic aneurysm repair  2009    stenting  . Left heart catheterization with coronary/graft angiogram N/A 06/11/2013    Procedure: LEFT HEART CATHETERIZATION WITH [name];  Surgeon: Thayer Headings, MD;  Location: [facility] CATH LAB;  Service: Cardiovascular;  Laterality: N/A;  . Abdominal aortagram N/A 03/06/2014    Procedure: ABDOMINAL AORTAGRAM;  Surgeon: Rosetta Posner, MD;  Location: [facility] CATH LAB;  Service: Cardiovascular;  Laterality: N/A;     Current Outpatient Prescriptions  Medication Sig Dispense Refill  . ACCU-CHEK FASTCLIX LANCETS MISC   6  . ALPRAZolam (XANAX) 0.25 MG tablet Take 1 tablet (0.25 mg total) by mouth at bedtime as needed for anxiety. 90 tablet 1  . amLODipine (NORVASC) 5 MG tablet TAKE 1 TABLET (5 MG TOTAL) BY MOUTH DAILY. 30 tablet 11  . aspirin EC 325 MG tablet Take 325 mg by mouth at bedtime.    [name] augmented betamethasone dipropionate (DIPROLENE-AF) 0.05 % cream Apply 1 application topically  daily as needed. For hands  1  . cycloSPORINE (RESTASIS) 0.05 % ophthalmic emulsion Place 1 drop into both eyes at bedtime.    . diclofenac sodium (VOLTAREN) 1 % GEL Apply 1 application topically 2 (two) times daily as needed (arthritis pain).    . hydrALAZINE (APRESOLINE) 10 MG tablet TAKE 1 TABLET (10 MG TOTAL) BY MOUTH 3 (THREE) TIMES DAILY. 90 tablet 2  . KLOR-CON 10 10 MEQ tablet TAKE 2 TABLET TWICE A DAY 120 tablet 2  . metFORMIN (GLUCOPHAGE) 500 MG tablet Take 500 mg by mouth at bedtime.     . metoCLOPramide (REGLAN) 5 MG/5ML solution Take 1 tsp at bedtime 150 mL 0  . nitroGLYCERIN (NITROSTAT) 0.4 MG SL tablet Place 1 tablet (0.4 mg total) under the tongue every 5 (five) minutes as needed for chest pain. 25 tablet PRN  . Omega-3 Fatty Acids (FISH OIL) 1000 MG CAPS Take 2,000 mg by mouth 2 (two) times daily.    [name] omeprazole (PRILOSEC) 40 MG capsule TAKE 1 CAPSULE (40 MG TOTAL) BY MOUTH 2 (TWO) TIMES DAILY. 60 capsule 2  . PRENATAL VITAMINS PO Take 1 tablet by mouth at bedtime.     [name] PROAIR HFA 108 (90 BASE) MCG/ACT inhaler Inhale 1 puff into the lungs every 4 (four) hours as needed for wheezing or shortness of breath.   1  . Probiotic Product (PROBIOTIC DAILY PO) Take 1 capsule by mouth daily.     [name] torsemide (DEMADEX) 20 MG tablet Take 10 mg by mouth daily as needed (for fluid).    . tretinoin (RETIN-A) 0.1 % cream Apply 1 application topically every other day.     No current facility-administered medications for this visit.    Allergies:   Anticoagulant compound; Morphine and related; Bextra; Hydrocod polst-cpm polst er; Lipitor; Mevacor; Plavix ; Zocor; Doxycycline; Metoprolol; Plavix; Codeine; Hydrocod polst-cpm polst er; and Lisinopril    Social History:  The patient  reports that she quit smoking about 20 years ago. Her smoking use included Cigarettes. She has never used smokeless tobacco. She reports that she drinks about 3.6 - 6.0 oz of alcohol per week. She reports that she does  not use illicit drugs.   Family History:  The patient's family history includes AAA (abdominal aortic aneurysm) in her brother; Colon cancer in her paternal grandmother; Diabetes in her brother and sister; Heart attack in her brother, father, mother, and sister; Heart disease in her brother, father, mother, and sister; Hyperlipidemia in her brother and sister; Hypertension in her brother and sister; Liver cancer in her sister. There is no history of Pancreatic cancer, Rectal cancer, Stomach cancer, or Esophageal cancer.    ROS:  Please see the history of present  illness.   Otherwise, review of systems are positive for none.   All other systems are reviewed and negative.    PHYSICAL EXAM: VS:  BP 144/88 mmHg  Pulse 70  Ht 5\' 1"  (1.549 m)  Wt 67.087 kg (147 lb 14.4 oz)  BMI 27.96 kg/m2 , BMI Body mass index is 27.96 kg/(m^2). GEN: Well nourished, well developed, in no acute distress HEENT: normal Neck: no JVD, carotid bruits, or masses Cardiac: RRR; no murmurs, rubs, or gallops,no edema  Femoral and pedal pulses are good.  Respiratory:  clear to auscultation bilaterally, normal work of breathing GI: soft, nontender, nondistended, + BS, no masses.  MS: no deformity or atrophy Skin: warm and dry, no rash Neuro:  Strength and sensation are intact Psych: euthymic mood, full affect   EKG:  EKG is not ordered today.    Recent Labs: 10/15/2015: ALT 43*; BUN 14; Creat 0.77; Hemoglobin 15.1*; Magnesium 1.9; Platelets 192; Potassium 4.0; Sodium 141    Lipid Panel    Component Value Date/Time   CHOL 158 10/15/2015 0811   TRIG 245* 10/15/2015 0811   HDL 48 10/15/2015 0811   CHOLHDL 3.3 10/15/2015 0811   VLDL 49* 10/15/2015 0811   LDLCALC 61 10/15/2015 0811   LDLDIRECT 87.4 12/09/2014 0808      Wt Readings from Last 3 Encounters:  02/25/16 67.087 kg (147 lb 14.4 oz)  10/20/15 70.489 kg (155 lb 6.4 oz)  09/09/15 71.668 kg (158 lb)        ASSESSMENT AND PLAN:  1. ischemic  heart disease status post CABG in 1995. Most recent cardiac cath in July 2014 showed no recurrent stenoses and patent grafts. Most recent treadmill Myoview 11/17/14 normal with ejection fraction 86% 2. HTN with hypertensive heart disease 3. Hypercholesterolemia 4. Abnormal liver function studies with mild transaminase elevation.  Needs to continue to limit alcohol 5. Status post stent to right renal artery 2008 6. Status post stent graft to abdominal aortic aneurysm 2009. S/p iliac stent in 2016.  7. Peripheral artery disease followed by [name] 8. DM type 2.    Current medicines are reviewed at length with the patient today.  The patient does not have concerns regarding medicines.  The following changes have been made:  no change  Labs/ tests ordered today include:   Orders Placed This Encounter  Procedures  . Basic metabolic panel  . Lipid panel  . Hepatic function panel  . CBC w/Diff/Platelet  . HgB A1c  . Lipid panel  . Basic metabolic panel  . Hepatic function panel  . CBC w/Diff/Platelet  . HgB A1c    Disposition: Continue current medication.  Recheck in 4 months for follow-up office visit and fasting labs   Signed, Delrae Hagey Martinique MD, [facility]    02/25/2016 1:14 PM    [place]

## 2016-02-25 NOTE — Patient Instructions (Signed)
Continue your current therapy  We will check lab work today and in 4 months with your next visit.

## 2016-02-26 ENCOUNTER — Other Ambulatory Visit: Payer: Self-pay | Admitting: Vascular Surgery

## 2016-02-26 LAB — HEMOGLOBIN A1C
Hgb A1c MFr Bld: 5.6 % (ref <5.7)
Mean Plasma Glucose: 114 mg/dL (ref <117)

## 2016-02-27 ENCOUNTER — Telehealth: Payer: Self-pay | Admitting: Cardiology

## 2016-02-27 NOTE — Telephone Encounter (Signed)
Megan [name] is returning your call

## 2016-02-27 NOTE — Telephone Encounter (Signed)
Returned call to patient lab results given. 

## 2016-03-03 ENCOUNTER — Other Ambulatory Visit: Payer: Self-pay | Admitting: Cardiology

## 2016-03-03 NOTE — Telephone Encounter (Signed)
Rx refill sent to pharmacy. 

## 2016-03-12 ENCOUNTER — Other Ambulatory Visit: Payer: Self-pay | Admitting: Gastroenterology

## 2016-03-23 ENCOUNTER — Telehealth: Payer: Self-pay

## 2016-03-23 ENCOUNTER — Ambulatory Visit
Admission: RE | Admit: 2016-03-23 | Discharge: 2016-03-23 | Disposition: A | Discharge status: Home / Self Care | Payer: Medicare Other | Source: Ambulatory Visit | Attending: Vascular Surgery | Admitting: Vascular Surgery

## 2016-03-23 DIAGNOSIS — I714 Abdominal aortic aneurysm, without rupture, unspecified: Secondary | ICD-10-CM

## 2016-03-23 DIAGNOSIS — Z48812 Encounter for surgical aftercare following surgery on the circulatory system: Secondary | ICD-10-CM

## 2016-03-23 MED ORDER — IOPAMIDOL (ISOVUE-370) INJECTION 76%
75.0000 mL | Freq: Once | INTRAVENOUS | Status: AC | PRN
  Administered 2016-03-23: 75 mL via INTRAVENOUS

## 2016-03-23 NOTE — Telephone Encounter (Signed)
Continue omeprazole 40 mg po bid and antireflux measures I recommend that she stops taking metoclopramide.  If she has worsening nighttime or morning symptoms off metoclopramide I would like her to try ranitidine 150 mg hs in place of metoclopramide.

## 2016-03-23 NOTE — Telephone Encounter (Signed)
Received fax from pharmacy for patient requesting a refill of metoclopramide 5 mg/5 mL solution. Patient was prescribed metoclopramide by Megan [name] at her last office visit to take as needed. Please advise if patient can continue this medication.

## 2016-03-24 ENCOUNTER — Encounter: Payer: Self-pay | Admitting: Vascular Surgery

## 2016-03-24 NOTE — Telephone Encounter (Signed)
Informed patient of possible side effects with reglan and [name] recommends she stop taking the reglan at night. Patient states she has been only taking her omeprazole once daily but has been waking up with a sour taste in her mouth and states she has been using the reglan at bedtime.  Informed patient to take Zantac 150 mg at bedtime in it's place and also continue anti-reflux measures and omeprazole twice daily. Informed patient to contact our office if her symptoms get worse. Patient verbalized understanding.

## 2016-03-24 NOTE — Telephone Encounter (Signed)
Left a message for patient to return my call. 

## 2016-03-30 ENCOUNTER — Ambulatory Visit (INDEPENDENT_AMBULATORY_CARE_PROVIDER_SITE_OTHER): Payer: Medicare Other | Admitting: Vascular Surgery

## 2016-03-30 ENCOUNTER — Encounter: Payer: Self-pay | Admitting: Vascular Surgery

## 2016-03-30 VITALS — BP 124/61 | HR 72 | Temp 97.2°F | Resp 18 | Ht 60.5 in | Wt 150.8 lb

## 2016-03-30 DIAGNOSIS — I714 Abdominal aortic aneurysm, without rupture, unspecified: Secondary | ICD-10-CM

## 2016-03-30 DIAGNOSIS — Z48812 Encounter for surgical aftercare following surgery on the circulatory system: Secondary | ICD-10-CM

## 2016-03-30 NOTE — Progress Notes (Signed)
Referring Physician: [name], MD  Patient name: Megan [name] MRN: [id number] DOB: 1946-09-06 Sex: female  REASON FOR VISIT: F/U s/p EVAR 2009, Left iliac stent 2015  HPI: Megan [name] is a 70 y.o. female,  Who is here for her yearly follow up CTA s/p EVAR 2009 and left iliac stent 2015.  She states she has had 2 episodes of left calf crapping over the past 4 weeks after walking for exercise at the 1 mile mark.  Rest made the pain go away.  Other wise she reports no change in her health.   Other medical problems include Hypertension managed with Norvasc, DM managed with metformin, and a full strength daily Aspirin for heart protection.    Past Medical History  Diagnosis Date  . Coronary artery disease     a. s/p CABG in 1995;  b. 05/2013 Neg MV, EF 82%;  c. 06/2013 Cath: LM nl, LAD 40-50p, LCX nl, RCA 80p/140m, VG->RCA->PDA->PLSA min irregs, LIMA->LAD atretic, vigorous LV fxn.  . Renal artery stenosis ([place])     a. 2008 s/p PTA  . Hypertension   . Hyperlipidemia   . Osteoarthritis   . Fibromyalgia   . Eczema, dyshidrotic   . GERD (gastroesophageal reflux disease)   . AAA (abdominal aortic aneurysm) ([place])     a. s/p stent grafting in 2009.  . Diverticulosis   . Adenomatous colon polyp 05/2001  . Peripheral arterial disease ([facility])     left leg diagnosed by Dr Mare Ferrari  . Adenomatous duodenal polyp   . Myocardial infarction ([place])   . Anginal pain ([place])   . COPD (chronic obstructive pulmonary disease) ([place])   . Diabetes mellitus without complication [facility])    Past Surgical History  Procedure Laterality Date  . Renal artery stent  2008  . Coronary artery bypass graft  07/1994    Triple by Dr Ceasar Mons  . Carpal tunnel release  08/1993    left wrist  . Tonsilectomy, adenoidectomy, bilateral myringotomy and tubes  1989  . Total abdominal hysterectomy  1992  . Carpal tunnel release  01/1997    right  . Thyroid cyst apiration  05/1997 and 07/1998  . Cardiovascular stress  test  11/11/2009    normal study  . Cardiac catheterization    . Abdominal aortic aneurysm repair  2009    stenting  . Left heart catheterization with coronary/graft angiogram N/A 06/11/2013    Procedure: LEFT HEART CATHETERIZATION WITH [name];  Surgeon: Thayer Headings, MD;  Location: [facility] CATH LAB;  Service: Cardiovascular;  Laterality: N/A;  . Abdominal aortagram N/A 03/06/2014    Procedure: ABDOMINAL AORTAGRAM;  Surgeon: Rosetta Posner, MD;  Location: [facility] CATH LAB;  Service: Cardiovascular;  Laterality: N/A;    Family History  Problem Relation Age of Onset  . Heart disease Mother   . Heart attack Mother   . Heart disease Father     before age 41  . Heart attack Father   . Heart disease Sister     before age 72  . Diabetes Sister   . Hyperlipidemia Sister   . Hypertension Sister   . Heart attack Sister   . Heart disease Brother   . Diabetes Brother   . Hyperlipidemia Brother   . Hypertension Brother   . Heart attack Brother   . AAA (abdominal aortic aneurysm) Brother   . Colon cancer Paternal Grandmother   . Pancreatic cancer Neg Hx   . Rectal  cancer Neg Hx   . Stomach cancer Neg Hx   . Esophageal cancer Neg Hx   . Liver cancer Sister     SOCIAL HISTORY: Social History   Social History  . Marital Status: Married    Spouse Name: N/A  . Number of Children: N/A  . Years of Education: N/A   Occupational History  . Not on file.   Social History Main Topics  . Smoking status: Former Smoker    Types: Cigarettes    Quit date: 12/07/1995  . Smokeless tobacco: Never Used  . Alcohol Use: 3.6 - 6.0 oz/week    6-10 Glasses of wine per week     Comment: 8 drinks per wwek  . Drug Use: No  . Sexual Activity: Not Currently   Other Topics Concern  . Not on file   Social History Narrative    Allergies  Allergen Reactions  . Anticoagulant Compound Itching and Other (See Comments)    Hands and feet tingle and itch. Hands and feet tingle and itch.  .  Morphine And Related Nausea Only and Itching  . Bextra [Valdecoxib] Other (See Comments)    Increased lft's Increased lft's  . Hydrocod Polst-Cpm Polst Er Rash  . Lipitor [Atorvastatin] Other (See Comments)    Leg weakness Leg weakness  . Mevacor [Lovastatin] Other (See Comments)    Muscle weakness Muscle weakness  . Plavix  [Clopidogrel Bisulfate] Itching and Other (See Comments)    Hands and feet tingle and itch.  . Zocor [Simvastatin] Other (See Comments)    Bones hurt Bones hurt   . Doxycycline Diarrhea    N/v/d  . Metoprolol Other (See Comments)    Hair loss  . Plavix [Clopidogrel Bisulfate] Other (See Comments)    Itching of hands and feet  . Codeine Itching and Rash    Hyper-active  . Hydrocod Polst-Cpm Polst Er Rash  . Lisinopril Cough    Current Outpatient Prescriptions  Medication Sig Dispense Refill  . ACCU-CHEK FASTCLIX LANCETS MISC   6  . ALPRAZolam (XANAX) 0.25 MG tablet Take 1 tablet (0.25 mg total) by mouth at bedtime as needed for anxiety. 90 tablet 1  . amLODipine (NORVASC) 5 MG tablet TAKE 1 TABLET (5 MG TOTAL) BY MOUTH DAILY. 30 tablet 11  . aspirin EC 325 MG tablet Take 325 mg by mouth at bedtime.    [name] augmented betamethasone dipropionate (DIPROLENE-AF) 0.05 % cream Apply 1 application topically daily as needed. For hands  1  . cycloSPORINE (RESTASIS) 0.05 % ophthalmic emulsion Place 1 drop into both eyes at bedtime.    . diclofenac sodium (VOLTAREN) 1 % GEL Apply 1 application topically 2 (two) times daily as needed (arthritis pain).    . hydrALAZINE (APRESOLINE) 10 MG tablet TAKE 1 TABLET (10 MG TOTAL) BY MOUTH 3 (THREE) TIMES DAILY. 90 tablet 2  . KLOR-CON 10 10 MEQ tablet TAKE 2 TABLET TWICE A DAY 120 tablet 0  . metFORMIN (GLUCOPHAGE) 500 MG tablet Take 500 mg by mouth at bedtime.     . metoCLOPramide (REGLAN) 5 MG/5ML solution Take 1 tsp at bedtime 150 mL 0  . nitroGLYCERIN (NITROSTAT) 0.4 MG SL tablet Place 1 tablet (0.4 mg total) under the tongue  every 5 (five) minutes as needed for chest pain. 25 tablet PRN  . Omega-3 Fatty Acids (FISH OIL) 1000 MG CAPS Take 2,000 mg by mouth 2 (two) times daily.    [name] omeprazole (PRILOSEC) 40 MG capsule TAKE 1 CAPSULE (40  MG TOTAL) BY MOUTH 2 (TWO) TIMES DAILY. 60 capsule 2  . PRENATAL VITAMINS PO Take 1 tablet by mouth at bedtime.     [name] PROAIR HFA 108 (90 BASE) MCG/ACT inhaler Inhale 1 puff into the lungs every 4 (four) hours as needed for wheezing or shortness of breath.   1  . Probiotic Product (PROBIOTIC DAILY PO) Take 1 capsule by mouth daily.     [name] torsemide (DEMADEX) 20 MG tablet Take 10 mg by mouth daily as needed (for fluid).    . tretinoin (RETIN-A) 0.1 % cream Apply 1 application topically every other day.     No current facility-administered medications for this visit.    ROS:   General:  No weight loss, Fever, chills  HEENT: No recent headaches, no nasal bleeding, no visual changes, no sore throat  Neurologic: No dizziness, blackouts, seizures. No recent symptoms of stroke or mini- stroke. No recent episodes of slurred speech, or temporary blindness.  Cardiac: No recent episodes of chest pain/pressure, no shortness of breath at rest.  No shortness of breath with exertion.  Denies history of atrial fibrillation or irregular heartbeat  Vascular: No history of rest pain in feet.  No history of claudication.  No history of non-healing ulcer, No history of DVT   Pulmonary: No home oxygen, no productive cough, no hemoptysis,  No asthma or wheezing  Musculoskeletal:  [ x] Arthritis, [ ]  Low back pain,  [ ]  Joint pain  Hematologic:No history of hypercoagulable state.  No history of easy bleeding.  No history of anemia  Gastrointestinal: No hematochezia or melena,  No gastroesophageal reflux, no trouble swallowing  Urinary: [ ]  chronic Kidney disease, [ ]  on HD - [ ]  MWF or [ ]  TTHS, [ ]  Burning with urination, [ ]  Frequent urination, [ ]  Difficulty urinating;   Skin: No  rashes  Psychological: positive history of anxiety,  No history of depression   Physical Examination  Filed Vitals:   03/30/16 1334  BP: 124/61  Pulse: 72  Temp: 97.2 F (36.2 C)  TempSrc: Oral  Resp: 18  Height: 5' 0.5" (1.537 m)  Weight: 150 lb 12.8 oz (68.402 kg)  SpO2: 95%    Body mass index is 28.95 kg/(m^2).  General:  Alert and oriented, no acute distress HEENT: Normal Neck: No bruit or JVD Pulmonary: Clear to auscultation bilaterally Cardiac: Regular Rate and Rhythm without murmur Abdomen: Soft, non-tender, non-distended, no mass, no scars Skin: No rash Extremity Pulses:  2+ radial, brachial, femoral, dorsalis pedis, pulses bilaterally Musculoskeletal: No deformity or edema  Neurologic: Upper and lower extremity motor 5/5 and symmetric  DATA:  CTA 03/23/2016  Reviewed by [name] today 03/30/2016 IMPRESSION: 1. Patent infrarenal bifurcated stent graft with no endoleak, stable native aneurysm diameter at 4.3 cm. 2. 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 3. Colonic diverticulosis.  ASSESSMENT:   Stable disposition s/p EVAR 2009 Left lilac stent 2015   PLAN:   She is able to performs any activity she wishes.  She walks for exercise daily.  We are not concerned about the 2 episodes of calf pain at this time.  This may be due to muscle demand and the stent placement on the left iliac.  If she has daily pain/cramping she will call us and we will schedule her for an angiogram.  Other wise activity as tolerates. She will f/u in 1 year for abdominal ultrasound of her EVAR.     COLLINS, EMMA  [facility] PA-C [facility] Office: [phone]  The patient was seen today in conjunction with [name] I have examined the patient, reviewed and agree with above.  Curt Jews, MD 03/30/2016 2:46 PM

## 2016-04-04 ENCOUNTER — Other Ambulatory Visit: Payer: Self-pay | Admitting: Cardiology

## 2016-04-05 NOTE — Telephone Encounter (Signed)
Rx request sent to pharmacy.  

## 2016-04-27 ENCOUNTER — Other Ambulatory Visit: Payer: Self-pay

## 2016-04-27 MED ORDER — TORSEMIDE 20 MG PO TABS: 10.0000 mg | ORAL_TABLET | Freq: Every day | ORAL | Status: DC | PRN

## 2016-04-27 NOTE — Telephone Encounter (Signed)
Rx(s) sent to pharmacy electronically.  

## 2016-04-29 ENCOUNTER — Telehealth: Payer: Self-pay

## 2016-04-29 DIAGNOSIS — F419 Anxiety disorder, unspecified: Secondary | ICD-10-CM

## 2016-04-29 MED ORDER — ALPRAZOLAM 0.25 MG PO TABS: 0.2500 mg | ORAL_TABLET | Freq: Every evening | ORAL | Status: DC | PRN

## 2016-04-29 NOTE — Telephone Encounter (Signed)
Patient notified of approval of Xanax, med called in to pharmacy per Dr Martinique.

## 2016-04-29 NOTE — Telephone Encounter (Signed)
It is OK to refill her Xanax. 0.25 mg. # 90. Take qhs as needed for anxiety  Taraji Mungo Martinique MD, [facility]

## 2016-04-29 NOTE — Addendum Note (Signed)
Addended by: Ulice Brilliant T on: 04/29/2016 04:42 PM   Modules accepted: Orders

## 2016-04-29 NOTE — Telephone Encounter (Signed)
Patient called in requesting a refill on her Xanax, i informed her that i would send the message over to Dr Martinique and his nurse and that someone would give her a call back. Patient informed me that the last time this med has been refilled was December 2016.

## 2016-05-02 ENCOUNTER — Other Ambulatory Visit: Payer: Self-pay | Admitting: Cardiology

## 2016-05-04 NOTE — Addendum Note (Signed)
Addended by: Mena Goes on: 05/04/2016 04:27 PM   Modules accepted: Orders

## 2016-05-18 ENCOUNTER — Other Ambulatory Visit: Payer: Self-pay | Admitting: Cardiology

## 2016-05-18 NOTE — Telephone Encounter (Signed)
Rx Refill

## 2016-05-20 ENCOUNTER — Other Ambulatory Visit: Payer: Self-pay

## 2016-05-20 MED ORDER — POTASSIUM CHLORIDE ER 10 MEQ PO TBCR: 10.0000 meq | EXTENDED_RELEASE_TABLET | Freq: Every day | ORAL | Status: DC

## 2016-05-20 MED ORDER — ATORVASTATIN CALCIUM 40 MG PO TABS: 40.0000 mg | ORAL_TABLET | Freq: Every day | ORAL | Status: DC

## 2016-05-24 ENCOUNTER — Other Ambulatory Visit: Payer: Self-pay | Admitting: Cardiology

## 2016-05-27 ENCOUNTER — Other Ambulatory Visit: Payer: Self-pay | Admitting: Cardiology

## 2016-06-18 ENCOUNTER — Other Ambulatory Visit: Payer: Self-pay | Admitting: Family Medicine

## 2016-06-18 DIAGNOSIS — Z1231 Encounter for screening mammogram for malignant neoplasm of breast: Secondary | ICD-10-CM

## 2016-06-24 ENCOUNTER — Ambulatory Visit (INDEPENDENT_AMBULATORY_CARE_PROVIDER_SITE_OTHER): Payer: Medicare Other | Admitting: Gastroenterology

## 2016-06-24 ENCOUNTER — Encounter: Payer: Self-pay | Admitting: Gastroenterology

## 2016-06-24 ENCOUNTER — Other Ambulatory Visit (INDEPENDENT_AMBULATORY_CARE_PROVIDER_SITE_OTHER): Payer: Medicare Other

## 2016-06-24 ENCOUNTER — Telehealth: Payer: Self-pay | Admitting: Gastroenterology

## 2016-06-24 VITALS — BP 136/72 | HR 68 | Ht 61.0 in | Wt 153.0 lb

## 2016-06-24 DIAGNOSIS — R197 Diarrhea, unspecified: Secondary | ICD-10-CM | POA: Diagnosis not present

## 2016-06-24 DIAGNOSIS — K219 Gastro-esophageal reflux disease without esophagitis: Secondary | ICD-10-CM | POA: Diagnosis not present

## 2016-06-24 DIAGNOSIS — K5730 Diverticulosis of large intestine without perforation or abscess without bleeding: Secondary | ICD-10-CM | POA: Diagnosis not present

## 2016-06-24 LAB — IGA: IgA: 163 mg/dL (ref 68–378)

## 2016-06-24 MED ORDER — RIFAXIMIN 550 MG PO TABS: 550.0000 mg | ORAL_TABLET | Freq: Three times a day (TID) | ORAL | Status: DC

## 2016-06-24 NOTE — Patient Instructions (Signed)
Your physician has requested that you go to the basement for the following lab work before leaving today: TTG, IGA.  We have sent your demographic information and a prescription for Xifaxan to [facility]. This pharmacy is able to get medication approved through insurance and get you the lowest copay possible. If you have not heard from them within 1 week, please call our office at [phone] to let us know.  Thank you for choosing me and [facility].  Pricilla Riffle. Dagoberto Ligas., MD., [facility]

## 2016-06-24 NOTE — Progress Notes (Signed)
    History of Present Illness: This is a 70 year old female who relates Requip postprandial diarrhea for the past several months. She has been taking metformin for about the past year. She denies recent antibiotic usage or medication changes. Denies weight loss, abdominal pain, constipation, change in stool caliber, melena, hematochezia, nausea, vomiting, dysphagia, reflux symptoms, chest pain.  Colonoscopy 09/2014 ENDOSCOPIC IMPRESSION: 1. Sessile polyp in the rectum; polypectomy performed with a cold snare (hyperplastic) 2. Severe diverticulosis in the left colon 3. Mild diverticulosis in the transverse colon and ascending colon  CT angiogram abdomen and pelvis 03/23/2016 IMPRESSION: 1. Patent infrarenal bifurcated stent graft with no endoleak, stable native aneurysm diameter at 4.3 cm. 2. 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 3. Colonic diverticulosis.  Current Medications, Allergies, Past Medical History, Past Surgical History, Family History and Social History were reviewed in [employer] record.  Physical Exam: General: Well developed, well nourished, no acute distress Head: Normocephalic and atraumatic Eyes:  sclerae anicteric, EOMI Ears: Normal auditory acuity Mouth: No deformity or lesions Lungs: Clear throughout to auscultation Heart: Regular rate and rhythm; no murmurs, rubs or bruits Abdomen: Soft, non tender and non distended. No masses, hepatosplenomegaly or hernias noted. Normal Bowel sounds Rectal: not done Musculoskeletal: Symmetrical with no gross deformities  Pulses:  Normal pulses noted Extremities: No clubbing, cyanosis, edema or deformities noted Neurological: Alert oriented x 4, grossly nonfocal Psychological:  Alert and cooperative. Normal mood and affect  Assessment and Recommendations:  1. Diarrhea, postprandial. R/O bacterial overgrowth, metformin side effects, celiac disease. Obtain tTG and IgA today.  2 week course of Xifaxan 550 3 times a day. If  diarrhea does not resolve she will follow-up with her PCP regarding a hold of metformin or a change to slow release metformin  2. Colonic diverticulosis. High-fiber diet.  3. History of a duodenal adenoma. No recurrence on last EGD in October 2015.  4. GERD. Standard antireflux measures. Continue omeprazole 40 mg twice daily.  5. Diabetes mellitus.

## 2016-06-24 NOTE — Telephone Encounter (Signed)
Office notes faxed to [facility] and waiting on decision.

## 2016-06-25 LAB — TISSUE TRANSGLUTAMINASE, IGA: Tissue Transglutaminase Ab, IgA: 1 U/mL (ref <4)

## 2016-06-28 ENCOUNTER — Telehealth: Payer: Self-pay | Admitting: Cardiology

## 2016-06-28 NOTE — Telephone Encounter (Signed)
New message     Pt is due to see Dr Martinique soon. She is having labs drawn downstairs this week.  She want to know if Dr Martinique want to add a test to measure muscle "something" due to statin use and check her for hepatitis C because of her age.  Please call

## 2016-06-28 NOTE — Telephone Encounter (Deleted)
Message routed to Dr. Martinique & Cheryl, LPN to advise

## 2016-06-28 NOTE — Telephone Encounter (Signed)
Spoke with patient. Informed her that MD & his primary are out of the office all week and I cannot order labs without MD approval. She voiced understanding. She has appt with Dr. Martinique 07/06/16

## 2016-06-29 LAB — CBC WITH DIFFERENTIAL/PLATELET
Basophils Absolute: 0 cells/uL (ref 0–200)
Basophils Relative: 0 %
Eosinophils Absolute: 210 cells/uL (ref 15–500)
Eosinophils Relative: 3 %
HCT: 44.3 % (ref 35.0–45.0)
Hemoglobin: 15 g/dL (ref 11.7–15.5)
Lymphocytes Relative: 32 %
Lymphs Abs: 2240 cells/uL (ref 850–3900)
MCH: 30.7 pg (ref 27.0–33.0)
MCHC: 33.9 g/dL (ref 32.0–36.0)
MCV: 90.8 fL (ref 80.0–100.0)
MPV: 10.9 fL (ref 7.5–12.5)
Monocytes Absolute: 420 cells/uL (ref 200–950)
Monocytes Relative: 6 %
Neutro Abs: 4130 cells/uL (ref 1500–7800)
Neutrophils Relative %: 59 %
Platelets: 192 10*3/uL (ref 140–400)
RBC: 4.88 MIL/uL (ref 3.80–5.10)
RDW: 13.1 % (ref 11.0–15.0)
WBC: 7 10*3/uL (ref 3.8–10.8)

## 2016-06-30 ENCOUNTER — Telehealth: Payer: Self-pay

## 2016-06-30 LAB — HEPATIC FUNCTION PANEL
ALT: 57 U/L — ABNORMAL HIGH (ref 6–29)
AST: 42 U/L — ABNORMAL HIGH (ref 10–35)
Albumin: 4.1 g/dL (ref 3.6–5.1)
Alkaline Phosphatase: 72 U/L (ref 33–130)
Bilirubin, Direct: 0.1 mg/dL (ref <=0.2)
Indirect Bilirubin: 0.6 mg/dL (ref 0.2–1.2)
Total Bilirubin: 0.7 mg/dL (ref 0.2–1.2)
Total Protein: 6.2 g/dL (ref 6.1–8.1)

## 2016-06-30 LAB — LIPID PANEL
Cholesterol: 187 mg/dL (ref 125–200)
HDL: 59 mg/dL (ref >=46)
LDL Cholesterol: 81 mg/dL (ref <130)
Total CHOL/HDL Ratio: 3.2 Ratio (ref <=5.0)
Triglycerides: 234 mg/dL — ABNORMAL HIGH (ref <150)
VLDL: 47 mg/dL — ABNORMAL HIGH (ref <30)

## 2016-06-30 LAB — HEMOGLOBIN A1C
Hgb A1c MFr Bld: 5.6 % (ref <5.7)
Mean Plasma Glucose: 114 mg/dL

## 2016-06-30 LAB — BASIC METABOLIC PANEL
BUN: 15 mg/dL (ref 7–25)
CO2: 27 mmol/L (ref 20–31)
Calcium: 9.5 mg/dL (ref 8.6–10.4)
Chloride: 104 mmol/L (ref 98–110)
Creat: 0.71 mg/dL (ref 0.60–0.93)
Glucose, Bld: 102 mg/dL — ABNORMAL HIGH (ref 65–99)
Potassium: 4.3 mmol/L (ref 3.5–5.3)
Sodium: 142 mmol/L (ref 135–146)

## 2016-06-30 NOTE — Telephone Encounter (Signed)
Patient states that the Megan [name] is still too expensive even with the approval from [facility]. Patient would like an alternative. Please advise.

## 2016-06-30 NOTE — Telephone Encounter (Signed)
Spoke with Megan [name] from [facility] that states they submitted the PA and are waiting on a response from [employer]. Notified patient. Patient verbalized understanding.

## 2016-06-30 NOTE — Telephone Encounter (Signed)
Flagyl 500 mg po bid, # 28

## 2016-07-01 MED ORDER — METRONIDAZOLE 500 MG PO TABS: 500.0000 mg | ORAL_TABLET | Freq: Two times a day (BID) | ORAL | 0 refills | Status: AC

## 2016-07-01 NOTE — Telephone Encounter (Signed)
Informed patient that we will send Flagyl to her pharmacy for 14 days. Notified patient to not have any alcohol while taking this medication. Patient verbalized understanding.

## 2016-07-04 NOTE — Progress Notes (Addendum)
Cardiology Office Note   Date:  07/05/2016   ID:  Megan [name], DOB [birth year], MRN [id number]  PCP:  [name], MD  Cardiologist: Leno Mathes Martinique MD  Chief Complaint  Patient presents with  . Follow-up    patient reports no complaints  . Coronary Artery Disease      History of Present Illness: Megan [name] is a 70 y.o. female who is seen for follow up of CAD, HTN, HL, and PAD.   She has a past history of essential hypertension and  coronary artery disease. Also has a history of dyslipidemia. She is status post coronary artery bypass graft surgery in 1995. She had a renal artery stent for renal artery stenosis in 2008. She had a stent graft of an abdominal aortic aneurysm 2009. She is s/p stenting of the left iliac. She is followed by [name] for PAD. She has had chronic mild elevation of liver function enzymes secondary to modest alcohol intake and to fatty liver.   She was hospitalized in July 2014 and had cardiac catheterization which showed that her grafts were open. There was no evidence of myocardial damage. She was hospitalized 11/15/2014 with which he describes as an aching sensation in her chest.  She underwent a treadmill Myoview on 11/17/14 which showed no ischemia and her ejection fraction was 86%. On follow up today she is doing well. She has had some chronic exertional dyspnea climbing hills.  She has not been experiencing any palpitations dizziness or syncope.  No chest pain. Walks at least a mile/day. She has abstained from Etoh due to the fact that she is taking Flagyl for intermittent diarrhea. She states she is no longer diabetic and is off metformin. She did have a CT angiogram in April noted below. She is experiencing night sweats and wants to know if it is OK to go on estrogen replacement.   Past Medical History:  Diagnosis Date  . AAA (abdominal aortic aneurysm) ([place])    a. s/p stent grafting in 2009.  . Adenomatous colon polyp 05/2001  . Adenomatous  duodenal polyp   . Anginal pain ([place])   . COPD (chronic obstructive pulmonary disease) ([place])   . Coronary artery disease    a. s/p CABG in 1995;  b. 05/2013 Neg MV, EF 82%;  c. 06/2013 Cath: LM nl, LAD 40-50p, LCX nl, RCA 80p/182m, VG->RCA->PDA->PLSA min irregs, LIMA->LAD atretic, vigorous LV fxn.  . Diabetes mellitus without complication ([place])   . Diverticulosis   . Eczema, dyshidrotic   . Fibromyalgia   . GERD (gastroesophageal reflux disease)   . Hyperlipidemia   . Hypertension   . Myocardial infarction ([place])   . Osteoarthritis   . Peripheral arterial disease ([facility])    left leg diagnosed by Dr Mare Ferrari  . Renal artery stenosis ([place])    a. 2008 s/p PTA    Past Surgical History:  Procedure Laterality Date  . ABDOMINAL AORTAGRAM N/A 03/06/2014   Procedure: ABDOMINAL AORTAGRAM;  Surgeon: Rosetta Posner, MD;  Location: [facility] CATH LAB;  Service: Cardiovascular;  Laterality: N/A;  . ABDOMINAL AORTIC ANEURYSM REPAIR  2009   stenting  . CARDIAC CATHETERIZATION    . CARDIOVASCULAR STRESS TEST  11/11/2009   normal study  . CARPAL TUNNEL RELEASE  08/1993   left wrist  . CARPAL TUNNEL RELEASE  01/1997   right  . CORONARY ARTERY BYPASS GRAFT  07/1994   Triple by Dr Ceasar Mons  . LEFT HEART CATHETERIZATION WITH CORONARY/GRAFT  ANGIOGRAM N/A 06/11/2013   Procedure: LEFT HEART CATHETERIZATION WITH [name];  Surgeon: Thayer Headings, MD;  Location: [facility] CATH LAB;  Service: Cardiovascular;  Laterality: N/A;  . RENAL ARTERY STENT  2008  . thyroid cyst apiration  05/1997 and 07/1998  . TONSILECTOMY, ADENOIDECTOMY, BILATERAL MYRINGOTOMY AND TUBES  1989  . TOTAL ABDOMINAL HYSTERECTOMY  1992     Current Outpatient Prescriptions  Medication Sig Dispense Refill  . ACCU-CHEK FASTCLIX LANCETS MISC   6  . ALPRAZolam (XANAX) 0.25 MG tablet Take 1 tablet (0.25 mg total) by mouth at bedtime as needed for anxiety. 90 tablet 0  . amLODipine (NORVASC) 5 MG tablet TAKE 1 TABLET (5 MG TOTAL) BY MOUTH  DAILY. 30 tablet 1  . atorvastatin (LIPITOR) 40 MG tablet Take 1 tablet (40 mg total) by mouth daily at 6 PM. 90 tablet 2  . augmented betamethasone dipropionate (DIPROLENE-AF) 0.05 % cream Apply 1 application topically daily as needed. For hands  1  . ciclopirox (LOPROX) 0.77 % cream Apply topically as needed.    . clindamycin (CLEOCIN T) 1 % lotion Apply topically as needed.    . clobetasol cream (TEMOVATE) [id number] % Apply 1 application topically 2 (two) times daily as needed.    . cycloSPORINE (RESTASIS) 0.05 % ophthalmic emulsion Place 1 drop into both eyes at bedtime.    . diclofenac sodium (VOLTAREN) 1 % GEL Apply 1 application topically 2 (two) times daily as needed (arthritis pain).    . hydrALAZINE (APRESOLINE) 10 MG tablet TAKE 1 TABLET (10 MG TOTAL) BY MOUTH 3 (THREE) TIMES DAILY. GAVE 20 DISPENSE 70 CHARGE FOR 90 90 tablet 8  . losartan-hydrochlorothiazide (HYZAAR) 100-25 MG tablet TAKE 1 TABLET BY MOUTH DAILY. 90 tablet 3  . metroNIDAZOLE (FLAGYL) 500 MG tablet Take 1 tablet (500 mg total) by mouth 2 (two) times daily. 28 tablet 0  . nitroGLYCERIN (NITROSTAT) 0.4 MG SL tablet Place 1 tablet (0.4 mg total) under the tongue every 5 (five) minutes as needed for chest pain. 25 tablet PRN  . Omega-3 Fatty Acids (FISH OIL) 1000 MG CAPS Take 2,000 mg by mouth 2 (two) times daily.    [name] omeprazole (PRILOSEC) 40 MG capsule TAKE 1 CAPSULE (40 MG TOTAL) BY MOUTH 2 (TWO) TIMES DAILY. 60 capsule 2  . potassium chloride (KLOR-CON 10) 10 MEQ tablet Take 1 tablet (10 mEq total) by mouth daily. 360 tablet 2  . PRENATAL VITAMINS PO Take 1 tablet by mouth at bedtime.     [name] PROAIR HFA 108 (90 BASE) MCG/ACT inhaler Inhale 1 puff into the lungs every 4 (four) hours as needed for wheezing or shortness of breath.   1  . torsemide (DEMADEX) 20 MG tablet Take 0.5 tablets (10 mg total) by mouth daily as needed (for fluid). 30 tablet 4  . tretinoin (RETIN-A) 0.1 % cream Apply 1 application topically every other day.      Current Facility-Administered Medications  Medication Dose Route Frequency Provider Last Rate Last Dose  . aspirin chewable tablet 81 mg  81 mg Oral Once Jamicheal Heard M Martinique, MD        Allergies:   Anticoagulant compound; Morphine and related; Bextra [valdecoxib]; Hydrocod polst-cpm polst er; Lipitor [atorvastatin]; Mevacor [lovastatin]; Plavix  [clopidogrel bisulfate]; Zocor [simvastatin]; Doxycycline; Metoprolol; Plavix [clopidogrel bisulfate]; Codeine; Hydrocod polst-cpm polst er; and Lisinopril    Social History:  The patient  reports that she quit smoking about 20 years ago. Her smoking use included Cigarettes. She has never  used smokeless tobacco. She reports that she drinks about 3.6 - 6.0 oz of alcohol per week . She reports that she does not use drugs.   Family History:  The patient's family history includes AAA (abdominal aortic aneurysm) in her brother; Colon cancer in her paternal grandmother; Diabetes in her brother and sister; Heart attack in her brother, father, mother, and sister; Heart disease in her brother, father, mother, and sister; Hyperlipidemia in her brother and sister; Hypertension in her brother and sister; Liver cancer in her sister.    ROS:  Please see the history of present illness.   Otherwise, review of systems are positive for none.   All other systems are reviewed and negative.    PHYSICAL EXAM: VS:  BP 128/75   Pulse 61   Ht 5' 0.5" (1.537 m)   Wt 154 lb (69.9 kg)   BMI 29.58 kg/m  , BMI Body mass index is 29.58 kg/m. GEN: Well nourished, well developed, in no acute distress  HEENT: normal  Neck: no JVD, carotid bruits, or masses Cardiac: RRR; no murmurs, rubs, or gallops,no edema  Femoral and pedal pulses are good.  Respiratory:  clear to auscultation bilaterally, normal work of breathing GI: soft, nontender, nondistended, + BS, no masses.  MS: no deformity or atrophy  Skin: warm and dry, no rash Neuro:  Strength and sensation are intact Psych:  euthymic mood, full affect   EKG:  EKG is not ordered today.    Recent Labs: 10/15/2015: Magnesium 1.9 06/29/2016: ALT 57; BUN 15; Creat 0.71; Hemoglobin 15.0; Platelets 192; Potassium 4.3; Sodium 142   Labs from primary care 02/25/16: Cholesterol 188, Trig-218, HDL 57, LDL 87. A1c 5.6%. AST 36, ALT 44. Normal renal indices.  Lipid Panel    Component Value Date/Time   CHOL 187 06/29/2016 1054   TRIG 234 (H) 06/29/2016 1054   HDL 59 06/29/2016 1054   CHOLHDL 3.2 06/29/2016 1054   VLDL 47 (H) 06/29/2016 1054   LDLCALC 81 06/29/2016 1054   LDLDIRECT 87.4 12/09/2014 0808      Wt Readings from Last 3 Encounters:  07/05/16 154 lb (69.9 kg)  06/24/16 153 lb (69.4 kg)  03/30/16 150 lb 12.8 oz (68.4 kg)     CT Angio Abd/Pel w/ and/or w/o (Accession [id number]) (Order [id number])  Imaging  Date: 03/23/2016 Department: [facility] Released By: Shelda Pal Authorizing: Rosetta Posner, MD  PACS Images   Show images for CT Angio Abd/Pel w/ and/or w/o  Study Result   CLINICAL DATA:  Aortic aneurysm, post stent graft repair 2009, revision of left limb April 2015. Previous right renal artery stent.  EXAM: CT ANGIOGRAPHY ABDOMEN AND PELVIS  TECHNIQUE: Multidetector CT imaging of the abdomen and pelvis was performed using the standard protocol during bolus administration of intravenous contrast. Multiplanar reconstructed images including MIPs were obtained and reviewed to evaluate the vascular anatomy.  CONTRAST:  75 mL Isovue 370 IV  COMPARISON:  COMPARISON 09/10/2015  FINDINGS: ARTERIAL  Aorta: Moderate calcified plaque in the visualized distal descending and suprarenal segments of the aorta. Patent infrarenal bifurcated stent graft. No endoleak. Native aneurysm sac maximum transverse diameter 4.3 cm, stable by my measurement.  Celiac axis: Calcified ostial plaque resulting in short segment stenosis of at least mild severity,  patent distally.  Superior mesenteric: Partially calcified ostial plaque extending over length of at least 2 cm, resulting in stenosis of at least mild severity. Patent distally with classic distal branch anatomy.  Left renal: Duplicated, Co dominant. The origin of the inferior left renal artery is partially covered by stent tines and remains patent.  Right renal: Single with patent stent across ostial plaque. Patent distally.  Inferior mesenteric: Short segment origin occlusion, reconstituted distally by visceral collaterals.  Left iliac: Left limb of the stent graft extends to the mid common iliac, with a coaxial stent extending into the proximal external iliac artery. Origin occlusion of the internal iliac artery. Scattered calcified plaque in the mid and distal native external iliac artery without high-grade stenosis.  Right iliac: There is a kink in the right limb of the stent graft with folding of the fabric resulting in short segment stenosis of doubtful hemodynamic significance ; this has been present since scans dating back to 08/29/2009. The more distal limb remains patent, extends to the mid common iliac. Eccentric nonocclusive plaque in the distal native common iliac artery and internal and external iliac branches without aneurysm or high-grade stenosis.  VENOUS  Patent portal vein, SMV, splenic vein, bilateral renal veins, IVC.  Review of the MIP images confirms the above findings.  NONVASCULAR  Lower chest: Previous median sternotomy. Emphysematous changes in the visualized lung bases. No pleural or pericardial effusion.  Hepatobiliary: No masses or other significant abnormality.  Pancreas: Mild atrophy, without focal lesion or ductal dilatation.  Spleen: 2 coarsely calcified granulomas. No other mass. Normal in size.  Adrenals/Urinary Tract: No masses identified. No evidence of hydronephrosis.  Stomach/Bowel: Stomach decompressed. Small  bowel nondilated. Normal appendix. Scattered colonic diverticula most numerous in the sigmoid segment without significant adjacent inflammatory/edematous change or abscess.  Lymphatic: No pathologically enlarged lymph nodes.  Reproductive: Previous hysterectomy.  Other: No ascites.  No free air.  Musculoskeletal: Stable mild superior endplate compression deformity of T12. Early degenerative changes in the lower lumbar spine.  IMPRESSION: 1. Patent infrarenal bifurcated stent graft with no endoleak, stable native aneurysm diameter at 4.3 cm. 2. 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 3. Colonic diverticulosis.   Electronically Signed   By: Lucrezia Europe M.D.   On: 03/23/2016 12:24       ASSESSMENT AND PLAN:  1.CAD status post CABG in 1995. Most recent cardiac cath in July 2014 showed no recurrent stenoses and patent grafts. Most recent treadmill Myoview 11/17/14 normal with ejection fraction 86%. She is asymptomatic.  2. HTN with hypertensive heart disease. BP is under good control. 3. Hypercholesterolemia- well controlled on lipitor 4. Abnormal liver function studies with mild transaminase elevation.  Needs to continue to limit alcohol 5. Status post stent to right renal artery 2008. Patent by CT in April.  6. Status post stent graft to abdominal aortic aneurysm 2009.  S/p iliac stent in 2016. Stable by CT 7. Peripheral artery disease followed by [name]  I think it is fine for her to take hormonal therapy. She will start on low dose estrogen.  Current medicines are reviewed at length with the patient today.  The patient does not have concerns regarding medicines.  The following changes have been made:  no change  Labs/ tests ordered today include:   No orders of the defined types were placed in this encounter.   Disposition: Continue current medication.  Recheck in 6 months for follow-up office visit. She will have complete lab  work with Dr. Jonni Sanger in January.   Signed, Yahaira Bruski Martinique MD, [facility]    07/05/2016 11:08 AM    [place]

## 2016-07-05 ENCOUNTER — Ambulatory Visit (INDEPENDENT_AMBULATORY_CARE_PROVIDER_SITE_OTHER): Payer: Medicare Other | Admitting: Cardiology

## 2016-07-05 ENCOUNTER — Encounter: Payer: Self-pay | Admitting: Cardiology

## 2016-07-05 VITALS — BP 128/75 | HR 61 | Ht 60.5 in | Wt 154.0 lb

## 2016-07-05 DIAGNOSIS — E785 Hyperlipidemia, unspecified: Secondary | ICD-10-CM

## 2016-07-05 DIAGNOSIS — I119 Hypertensive heart disease without heart failure: Secondary | ICD-10-CM | POA: Diagnosis not present

## 2016-07-05 DIAGNOSIS — I25810 Atherosclerosis of coronary artery bypass graft(s) without angina pectoris: Secondary | ICD-10-CM

## 2016-07-05 DIAGNOSIS — I714 Abdominal aortic aneurysm, without rupture, unspecified: Secondary | ICD-10-CM

## 2016-07-05 DIAGNOSIS — I739 Peripheral vascular disease, unspecified: Secondary | ICD-10-CM

## 2016-07-05 DIAGNOSIS — I701 Atherosclerosis of renal artery: Secondary | ICD-10-CM | POA: Diagnosis not present

## 2016-07-05 MED ORDER — ASPIRIN 81 MG PO CHEW: 81.0000 mg | CHEWABLE_TABLET | Freq: Once | ORAL | Status: DC

## 2016-07-05 NOTE — Patient Instructions (Signed)
Reduce ASA to 81 mg daily  Continue your other therapy  I will see you in 6 months. 

## 2016-07-14 ENCOUNTER — Telehealth: Payer: Self-pay | Admitting: Gastroenterology

## 2016-07-14 NOTE — Telephone Encounter (Signed)
Patient was prescribed flagyl for small bowel bacterial overgrowth.  She reports she has one day left on flagyl.  She is having some LLQ pain.  She asks if she needs to have an additional week for possible diverticulitis.  I asked that she call back next week and see if her pain resolves after the completion of antibiotics.  She understands to call back on Monday with an update.

## 2016-07-21 ENCOUNTER — Other Ambulatory Visit: Payer: Self-pay | Admitting: Gastroenterology

## 2016-07-24 ENCOUNTER — Other Ambulatory Visit: Payer: Self-pay | Admitting: Cardiology

## 2016-07-26 ENCOUNTER — Ambulatory Visit
Admission: RE | Admit: 2016-07-26 | Discharge: 2016-07-26 | Disposition: A | Discharge status: Home / Self Care | Payer: Medicare Other | Source: Ambulatory Visit | Attending: Family Medicine | Admitting: Family Medicine

## 2016-07-26 DIAGNOSIS — Z1231 Encounter for screening mammogram for malignant neoplasm of breast: Secondary | ICD-10-CM

## 2016-11-20 ENCOUNTER — Other Ambulatory Visit: Payer: Self-pay | Admitting: Cardiology

## 2016-11-20 DIAGNOSIS — F419 Anxiety disorder, unspecified: Secondary | ICD-10-CM

## 2016-11-23 ENCOUNTER — Other Ambulatory Visit: Payer: Self-pay | Admitting: *Deleted

## 2016-11-23 DIAGNOSIS — F419 Anxiety disorder, unspecified: Secondary | ICD-10-CM

## 2016-11-23 MED ORDER — ALPRAZOLAM 0.25 MG PO TABS: 0.2500 mg | ORAL_TABLET | Freq: Every evening | ORAL | 0 refills | Status: DC | PRN

## 2016-11-24 ENCOUNTER — Telehealth: Payer: Self-pay | Admitting: Cardiology

## 2016-11-24 DIAGNOSIS — F419 Anxiety disorder, unspecified: Secondary | ICD-10-CM

## 2016-11-24 MED ORDER — ALPRAZOLAM 0.25 MG PO TABS: 0.2500 mg | ORAL_TABLET | Freq: Every evening | ORAL | 0 refills | Status: DC | PRN

## 2016-11-24 NOTE — Telephone Encounter (Signed)
Returned call to patient.She stated she wanted to ask Dr.Jordan if he would refill Xanax.Stated she only takes if needed.Message sent to [place].

## 2016-11-24 NOTE — Telephone Encounter (Signed)
Returned call to patient Xanax refill phoned to pharmacy. 

## 2016-11-24 NOTE — Telephone Encounter (Signed)
°  New Prob   Requesting to speak to nurse regarding a medication that used to be prescribed by Dr. Mare Ferrari. Please call.

## 2016-11-24 NOTE — Telephone Encounter (Signed)
OK by me to refill Xanax 0.25 mg #90 take prn  [name] MD, [facility]

## 2016-11-26 ENCOUNTER — Other Ambulatory Visit: Payer: Self-pay | Admitting: Cardiology

## 2016-12-15 LAB — HM HEPATITIS C SCREENING LAB: HM Hepatitis Screen: NEGATIVE

## 2017-01-02 NOTE — Progress Notes (Signed)
Cardiology Office Note   Date:  01/03/2017   ID:  Megan [name], DOB 01-24-1946, MRN [id number]  PCP:  Megan Arnt, [name]  Cardiologist: Megan [name]  Chief Complaint  Patient presents with  . Coronary Artery Disease  . Hypertension  . Hyperlipidemia      History of Present Illness: Megan [name] is a 71 y.o. female who is seen for follow up of CAD, HTN, HL, and PAD.   She has a past history of essential hypertension and  coronary artery disease. Also has a history of dyslipidemia. She is status post coronary artery bypass graft surgery in 1995. She had a renal artery stent for renal artery stenosis in 2008. She had a stent graft of an abdominal aortic aneurysm 2009. She is s/p stenting of the left iliac. She is followed by Dr. Donnetta [name] for PAD. She has had chronic mild elevation of liver function enzymes secondary to modest alcohol intake and to fatty liver.   She was hospitalized in July 2014 and had cardiac catheterization which showed that her grafts were open. There was no evidence of myocardial damage. She was hospitalized 11/15/2014 with which he describes as an aching sensation in her chest.  She underwent a treadmill Myoview on 11/17/14 which showed no ischemia and her ejection fraction was 86%. On follow up today she is doing well. She has  some chronic exertional dyspnea climbing hills. She thinks this is a little worse. She has not been experiencing any palpitations dizziness or syncope. She notes occasional "prickly" pain in left chest not associated with activity. Walks at least a mile/day. She has gained weight since her last visit. 9 lbs. She is on hormonal therapy now but really hasn't noted any improvement in sweats.   Past Medical History:  Diagnosis Date  . AAA (abdominal aortic aneurysm) ([place])    a. s/p stent grafting in 2009.  . Adenomatous colon polyp 05/2001  . Adenomatous duodenal polyp   . Anginal pain (Megan [name])   . COPD (chronic obstructive pulmonary  disease) (Megan [name])   . Coronary artery disease    a. s/p CABG in 1995;  b. 05/2013 Neg MV, EF 82%;  c. 06/2013 Cath: LM nl, LAD 40-50p, LCX nl, RCA 80p/117m, VG->RCA->PDA->PLSA min irregs, LIMA->LAD atretic, vigorous LV fxn.  . Diabetes mellitus without complication (Megan [name])   . Diverticulosis   . Eczema, dyshidrotic   . Fibromyalgia   . GERD (gastroesophageal reflux disease)   . Hyperlipidemia   . Hypertension   . Myocardial infarction   . Osteoarthritis   . Peripheral arterial disease (HCC)    left leg diagnosed by Dr Mare Megan [name]  . Renal artery stenosis ([place])    a. 2008 s/p PTA    Past Surgical History:  Procedure Laterality Date  . ABDOMINAL AORTAGRAM N/A 03/06/2014   Procedure: ABDOMINAL AORTAGRAM;  Surgeon: Megan Posner, [name];  Location: [facility] CATH LAB;  Service: Cardiovascular;  Laterality: N/A;  . ABDOMINAL AORTIC ANEURYSM REPAIR  2009   stenting  . CARDIAC CATHETERIZATION    . CARDIOVASCULAR STRESS TEST  11/11/2009   normal study  . CARPAL TUNNEL RELEASE  08/1993   left wrist  . CARPAL TUNNEL RELEASE  01/1997   right  . CORONARY ARTERY BYPASS GRAFT  07/1994   Triple by Dr Megan [name]  . LEFT HEART CATHETERIZATION WITH CORONARY/GRAFT ANGIOGRAM N/A 06/11/2013   Procedure: LEFT HEART CATHETERIZATION WITH Megan [name];  Surgeon: Megan Headings, [name];  Location: [facility] CATH LAB;  Service: Cardiovascular;  Laterality: N/A;  . RENAL ARTERY STENT  2008  . thyroid cyst apiration  05/1997 and 07/1998  . TONSILECTOMY, ADENOIDECTOMY, BILATERAL MYRINGOTOMY AND TUBES  1989  . TOTAL ABDOMINAL HYSTERECTOMY  1992     Current Outpatient Prescriptions  Medication Sig Dispense Refill  . ACCU-CHEK FASTCLIX LANCETS MISC   6  . ALPRAZolam (XANAX) 0.25 MG tablet Take 1 tablet (0.25 mg total) by mouth at bedtime as needed. 90 tablet 0  . amLODipine (NORVASC) 5 MG tablet Take 1 tablet (5 mg total) by mouth daily. 90 tablet 3  . atorvastatin (LIPITOR) 40 MG tablet Take 1 tablet (40 mg total) by mouth daily  at 6 PM. 90 tablet 2  . augmented betamethasone dipropionate (DIPROLENE-AF) 0.05 % cream Apply 1 application topically daily as needed. For hands  1  . ciclopirox (LOPROX) 0.77 % cream Apply topically as needed.    . clindamycin (CLEOCIN T) 1 % lotion Apply topically as needed.    . clobetasol cream (TEMOVATE) [id number] % Apply 1 application topically 2 (two) times daily as needed.    . cycloSPORINE (RESTASIS) 0.05 % ophthalmic emulsion Place 1 drop into both eyes at bedtime.    . hydrALAZINE (APRESOLINE) 10 MG tablet Take 1 tablet (10 mg total) by mouth 3 (three) times daily. 270 tablet 3  . KLOR-CON 10 10 MEQ tablet TAKE 2 TABLET BY MOUTH TWICE A DAY 120 tablet 3  . losartan-hydrochlorothiazide (HYZAAR) 100-25 MG tablet TAKE 1 TABLET BY MOUTH DAILY. 90 tablet 3  . nitroGLYCERIN (NITROSTAT) 0.4 MG SL tablet Place 1 tablet (0.4 mg total) under the tongue every 5 (five) minutes as needed for chest pain. 25 tablet PRN  . Omega-3 Fatty Acids (FISH OIL) 1000 MG CAPS Take 2,000 mg by mouth 2 (two) times daily.    [name] omeprazole (PRILOSEC) 40 MG capsule TAKE 1 CAPSULE (40 MG TOTAL) BY MOUTH 2 (TWO) TIMES DAILY. 60 capsule 11  . PRENATAL VITAMINS PO Take 1 tablet by mouth at bedtime.     . torsemide (DEMADEX) 20 MG tablet Take 0.5 tablets (10 mg total) by mouth daily as needed (for fluid). 30 tablet 4  . tretinoin (RETIN-A) 0.1 % cream Apply 1 application topically every other day.     Current Facility-Administered Medications  Medication Dose Route Frequency Provider Last Rate Last Dose  . aspirin chewable tablet 81 mg  81 mg Oral Once Megan M Martinique, [name]        Allergies:   Anticoagulant compound; Morphine and related; Bextra [valdecoxib]; Hydrocod polst-cpm polst er; Lipitor [atorvastatin]; Mevacor [lovastatin]; Plavix  [clopidogrel bisulfate]; Zocor [simvastatin]; Doxycycline; Metoprolol; Plavix [clopidogrel bisulfate]; Codeine; Hydrocod polst-cpm polst er; and Lisinopril    Social History:  The patient   reports that she quit smoking about 21 years ago. Her smoking use included Cigarettes. She has never used smokeless tobacco. She reports that she drinks about 3.6 - 6.0 oz of alcohol per week . She reports that she does not use drugs.   Family History:  The patient's family history includes AAA (abdominal aortic aneurysm) in her brother; Colon cancer in her paternal grandmother; Diabetes in her brother and sister; Heart attack in her brother, father, mother, and sister; Heart disease in her brother, father, mother, and sister; Hyperlipidemia in her brother and sister; Hypertension in her brother and sister; Liver cancer in her sister.    ROS:  Please see the history of present illness.   Otherwise, review of systems are positive  for none.   All other systems are reviewed and negative.    PHYSICAL EXAM: VS:  BP 124/62   Pulse 60   Ht 5' 0.5" (1.537 m)   Wt 163 lb (73.9 kg)   BMI 31.31 kg/m  , BMI Body mass index is 31.31 kg/m. GEN: Well nourished, well developed, in no acute distress  HEENT: normal  Neck: no JVD, carotid bruits, or masses Cardiac: RRR; no murmurs, rubs, or gallops,no edema  Femoral and pedal pulses are good.  Respiratory:  clear to auscultation bilaterally, normal work of breathing GI: soft, nontender, nondistended, + BS, no masses.  MS: no deformity or atrophy  Skin: warm and dry, no rash Neuro:  Strength and sensation are intact Psych: euthymic mood, full affect   EKG:  EKG today shows NSR with nonspecific TWA. I have personally reviewed and interpreted this study.     Recent Labs: 06/29/2016: ALT 57; BUN 15; Creat 0.71; Hemoglobin 15.0; Platelets 192; Potassium 4.3; Sodium 142   Labs from primary care 12/15/16: A1c 5.6%. : Cholesterol 167 Trig-209, HDL 52, LDL 73.  AST 51, ALT 65. Normal renal indices. TSH is normal.  Lipid Panel    Component Value Date/Time   CHOL 187 06/29/2016 1054   TRIG 234 (H) 06/29/2016 1054   HDL 59 06/29/2016 1054   CHOLHDL 3.2  06/29/2016 1054   VLDL 47 (H) 06/29/2016 1054   LDLCALC 81 06/29/2016 1054   LDLDIRECT 87.4 12/09/2014 0808      Wt Readings from Last 3 Encounters:  01/03/17 163 lb (73.9 kg)  07/05/16 154 lb (69.9 kg)  06/24/16 153 lb (69.4 kg)      ASSESSMENT AND PLAN:  1.CAD status post CABG in 1995. Most recent cardiac cath in July 2014 showed no recurrent stenoses and patent grafts. Most recent treadmill Myoview 11/17/14 normal with ejection fraction 86%. She is asymptomatic.  2. HTN with hypertensive heart disease. BP is under good control. 3. Hypercholesterolemia- well controlled on lipitor 4. Abnormal liver function studies with mild transaminase elevation.  Needs to continue to limit alcohol 5. Status post stent to right renal artery 2008. Patent by CT in April.  6. Status post stent graft to abdominal aortic aneurysm 2009.  S/p iliac stent in 2016. Stable by CT 7. Peripheral artery disease followed by Dr. Donnetta [name] 8. Dyspnea on exertion. I think this is predominantly related to her prior smoking history and weight gain. Try and focus on weight loss.  Current medicines are reviewed at length with the patient today.  The patient does not have concerns regarding medicines.  The following changes have been made:  no change  Labs/ tests ordered today include:   Orders Placed This Encounter  Procedures  . EKG 12-Lead    Disposition: Continue current medication.  Recheck in 6 months for follow-up office visit.    Signed, Megan [name], [facility]    01/03/2017 8:32 AM    [place]

## 2017-01-03 ENCOUNTER — Ambulatory Visit (INDEPENDENT_AMBULATORY_CARE_PROVIDER_SITE_OTHER): Payer: Medicare Other | Admitting: Cardiology

## 2017-01-03 ENCOUNTER — Encounter: Payer: Self-pay | Admitting: Cardiology

## 2017-01-03 ENCOUNTER — Other Ambulatory Visit: Payer: Self-pay

## 2017-01-03 VITALS — BP 124/62 | HR 60 | Ht 60.5 in | Wt 163.0 lb

## 2017-01-03 DIAGNOSIS — I119 Hypertensive heart disease without heart failure: Secondary | ICD-10-CM

## 2017-01-03 DIAGNOSIS — E785 Hyperlipidemia, unspecified: Secondary | ICD-10-CM | POA: Diagnosis not present

## 2017-01-03 DIAGNOSIS — I25810 Atherosclerosis of coronary artery bypass graft(s) without angina pectoris: Secondary | ICD-10-CM | POA: Diagnosis not present

## 2017-01-03 DIAGNOSIS — I739 Peripheral vascular disease, unspecified: Secondary | ICD-10-CM | POA: Diagnosis not present

## 2017-01-03 MED ORDER — AMLODIPINE BESYLATE 5 MG PO TABS: 5.0000 mg | ORAL_TABLET | Freq: Every day | ORAL | 3 refills | Status: DC

## 2017-01-03 MED ORDER — HYDRALAZINE HCL 10 MG PO TABS: 10.0000 mg | ORAL_TABLET | Freq: Three times a day (TID) | ORAL | 3 refills | Status: DC

## 2017-01-03 NOTE — Patient Instructions (Signed)
Continue your current therapy  Focus on weight loss and aerobic exercise  I will see you in 6 months.

## 2017-03-14 ENCOUNTER — Other Ambulatory Visit: Payer: Self-pay | Admitting: Cardiology

## 2017-03-16 ENCOUNTER — Telehealth: Payer: Self-pay | Admitting: Gastroenterology

## 2017-03-16 ENCOUNTER — Ambulatory Visit (INDEPENDENT_AMBULATORY_CARE_PROVIDER_SITE_OTHER): Payer: Medicare Other | Admitting: Gastroenterology

## 2017-03-16 ENCOUNTER — Encounter: Payer: Self-pay | Admitting: Gastroenterology

## 2017-03-16 VITALS — BP 150/70 | HR 72 | Resp 18 | Ht 60.5 in | Wt 163.0 lb

## 2017-03-16 DIAGNOSIS — R1031 Right lower quadrant pain: Secondary | ICD-10-CM | POA: Diagnosis not present

## 2017-03-16 MED ORDER — METRONIDAZOLE 500 MG PO TABS: 500.0000 mg | ORAL_TABLET | Freq: Two times a day (BID) | ORAL | 0 refills | Status: DC

## 2017-03-16 MED ORDER — CIPROFLOXACIN HCL 500 MG PO TABS: 500.0000 mg | ORAL_TABLET | Freq: Two times a day (BID) | ORAL | 0 refills | Status: DC

## 2017-03-16 NOTE — Telephone Encounter (Signed)
Megan [name] verified that cipro and flagyl should be BID x 10 days.  Pt notified.

## 2017-03-16 NOTE — Patient Instructions (Addendum)
Try Florastor probiotic.  We have sent the following medications to your pharmacy for you to pick up at your convenience: Cipro 500mg  twice a day and flagyl 500mg  twice a day.  Call us if symptoms persist.  Normal BMI (Body Mass Index- based on height and weight) is between 23 and 30. Your BMI today is Body mass index is 31.31 kg/m. [name] Please consider follow up  regarding your BMI with your Primary Care Provider.  Thank you

## 2017-03-16 NOTE — Telephone Encounter (Signed)
Patient with abdominal pain and low grade fever. She feels she has diverticulitis.  She will come in and see Alonza Bogus, PA at 2:00

## 2017-03-18 DIAGNOSIS — R1031 Right lower quadrant pain: Secondary | ICD-10-CM | POA: Insufficient documentation

## 2017-03-18 NOTE — Progress Notes (Signed)
03/18/2017 TUERE NWOSU [id number] Dec 10, 1945   HISTORY OF PRESENT ILLNESS:  This is a 71 year old female who is known to [name]. She is here today with complaints of right-sided abdominal pain, nausea, low-grade fever. She has been treated empirically for right-sided diverticulitis in the past although we do not have any radiographic evidence that she is actually ever had this. She's had 3 CT scans of the abdomen and pelvis within the past 2 years.  She was treated with Cipro and Flagyl in the past and symptoms responded to that. She tells me that her symptoms today feeling sick with the same as they had previously.  Her last colonoscopy was in October 2015 at which time she was found to have severe diverticulosis in the left colon and moderate diverticulosis in the transverse and ascending colon. She also had a polyp removed at that time. That was hyperplastic on pathology.   Past Medical History:  Diagnosis Date  . AAA (abdominal aortic aneurysm) ([place])    a. s/p stent grafting in 2009.  . Adenomatous colon polyp 05/2001  . Adenomatous duodenal polyp   . Anginal pain ([place])   . COPD (chronic obstructive pulmonary disease) ([place])   . Coronary artery disease    a. s/p CABG in 1995;  b. 05/2013 Neg MV, EF 82%;  c. 06/2013 Cath: LM nl, LAD 40-50p, LCX nl, RCA 80p/122m, VG->RCA->PDA->PLSA min irregs, LIMA->LAD atretic, vigorous LV fxn.  . Diabetes mellitus without complication ([place])   . Diverticulosis   . Eczema, dyshidrotic   . Fibromyalgia   . GERD (gastroesophageal reflux disease)   . Hyperlipidemia   . Hypertension   . Myocardial infarction   . Osteoarthritis   . Peripheral arterial disease (HCC)    left leg diagnosed by Dr Mare Ferrari  . Renal artery stenosis ([place])    a. 2008 s/p PTA   Past Surgical History:  Procedure Laterality Date  . ABDOMINAL AORTAGRAM N/A 03/06/2014   Procedure: ABDOMINAL AORTAGRAM;  Surgeon: Rosetta Posner, MD;  Location: [facility] CATH LAB;  Service:  Cardiovascular;  Laterality: N/A;  . ABDOMINAL AORTIC ANEURYSM REPAIR  2009   stenting  . CARDIAC CATHETERIZATION    . CARDIOVASCULAR STRESS TEST  11/11/2009   normal study  . CARPAL TUNNEL RELEASE  08/1993   left wrist  . CARPAL TUNNEL RELEASE  01/1997   right  . CORONARY ARTERY BYPASS GRAFT  07/1994   Triple by Dr Ceasar Mons  . LEFT HEART CATHETERIZATION WITH CORONARY/GRAFT ANGIOGRAM N/A 06/11/2013   Procedure: LEFT HEART CATHETERIZATION WITH [name];  Surgeon: Thayer Headings, MD;  Location: [facility] CATH LAB;  Service: Cardiovascular;  Laterality: N/A;  . RENAL ARTERY STENT  2008  . thyroid cyst apiration  05/1997 and 07/1998  . TONSILECTOMY, ADENOIDECTOMY, BILATERAL MYRINGOTOMY AND TUBES  1989  . TOTAL ABDOMINAL HYSTERECTOMY  1992    reports that she quit smoking about 21 years ago. Her smoking use included Cigarettes. She has never used smokeless tobacco. She reports that she drinks about 3.6 - 6.0 oz of alcohol per week . She reports that she does not use drugs. family history includes AAA (abdominal aortic aneurysm) in her brother; Colon cancer in her paternal grandmother; Diabetes in her brother and sister; Heart attack in her brother, father, mother, and sister; Heart disease in her brother, father, mother, and sister; Hyperlipidemia in her brother and sister; Hypertension in her brother and sister; Liver cancer in her sister. Allergies  Allergen Reactions  .  Anticoagulant Compound Itching and Other (See Comments)    Hands and feet tingle and itch. Hands and feet tingle and itch.  . Morphine And Related Nausea Only and Itching  . Bextra [Valdecoxib] Other (See Comments)    Increased lft's Increased lft's  . Hydrocod Polst-Cpm Polst Er Rash  . Lipitor [Atorvastatin] Other (See Comments)    Leg weakness Leg weakness  . Mevacor [Lovastatin] Other (See Comments)    Muscle weakness Muscle weakness  . Plavix  [Clopidogrel Bisulfate] Itching and Other (See Comments)     Hands and feet tingle and itch.  . Zocor [Simvastatin] Other (See Comments)    Bones hurt Bones hurt   . Doxycycline Diarrhea    N/v/d  . Metoprolol Other (See Comments)    Hair loss  . Plavix [Clopidogrel Bisulfate] Other (See Comments)    Itching of hands and feet  . Codeine Itching and Rash    Hyper-active  . Hydrocod Polst-Cpm Polst Er Rash  . Lisinopril Cough      Outpatient Encounter Prescriptions as of 03/16/2017  Medication Sig  . ACCU-CHEK FASTCLIX LANCETS MISC   . ALPRAZolam (XANAX) 0.25 MG tablet Take 1 tablet (0.25 mg total) by mouth at bedtime as needed.  [name] amLODipine (NORVASC) 5 MG tablet Take 1 tablet (5 mg total) by mouth daily.  [name] aspirin 325 MG tablet Take 325 mg by mouth daily.  [name] atorvastatin (LIPITOR) 40 MG tablet TAKE 1 TABLET (40 MG TOTAL) BY MOUTH DAILY AT 6 PM.  . augmented betamethasone dipropionate (DIPROLENE-AF) 0.05 % cream Apply 1 application topically daily as needed. For hands  . ciclopirox (LOPROX) 0.77 % cream Apply topically as needed.  . clindamycin (CLEOCIN T) 1 % lotion Apply topically as needed.  . clobetasol cream (TEMOVATE) 6.59 % Apply 1 application topically 2 (two) times daily as needed.  . cycloSPORINE (RESTASIS) 0.05 % ophthalmic emulsion Place 1 drop into both eyes at bedtime.  [name] estradiol (ESTRACE) 1 MG tablet Take 1 mg by mouth daily.  . hydrALAZINE (APRESOLINE) 10 MG tablet Take 1 tablet (10 mg total) by mouth 3 (three) times daily.  [name] KLOR-CON 10 10 MEQ tablet TAKE 2 TABLET BY MOUTH TWICE A DAY  . losartan-hydrochlorothiazide (HYZAAR) 100-25 MG tablet TAKE 1 TABLET BY MOUTH DAILY.  . nitroGLYCERIN (NITROSTAT) 0.4 MG SL tablet Place 1 tablet (0.4 mg total) under the tongue every 5 (five) minutes as needed for chest pain.  . Omega-3 Fatty Acids (FISH OIL) 1000 MG CAPS Take 2,000 mg by mouth 2 (two) times daily.  [name] omeprazole (PRILOSEC) 40 MG capsule TAKE 1 CAPSULE (40 MG TOTAL) BY MOUTH 2 (TWO) TIMES DAILY.  [name] PRENATAL VITAMINS PO  Take 1 tablet by mouth at bedtime.   . torsemide (DEMADEX) 20 MG tablet Take 0.5 tablets (10 mg total) by mouth daily as needed (for fluid).  . tretinoin (RETIN-A) 0.1 % cream Apply 1 application topically every other day.  . ciprofloxacin (CIPRO) 500 MG tablet Take 1 tablet (500 mg total) by mouth 2 (two) times daily.  . metroNIDAZOLE (FLAGYL) 500 MG tablet Take 1 tablet (500 mg total) by mouth 2 (two) times daily.  . [DISCONTINUED] aspirin chewable tablet 81 mg    No facility-administered encounter medications on file as of 03/16/2017.      REVIEW OF SYSTEMS  : All other systems reviewed and negative except where noted in the History of Present Illness.   PHYSICAL EXAM: BP (!) 150/70   Pulse 72  Resp 18   Ht 5' 0.5" (1.537 m)   Wt 163 lb (73.9 kg)   BMI 31.31 kg/m  General: Well developed white female in no acute distress Head: Normocephalic and atraumatic Eyes:  Sclerae anicteric, conjunctiva pink. Ears: Normal auditory acuity Lungs: Clear throughout to auscultation; no increased WOB. Heart: Regular rate and rhythm Abdomen: Soft, non-distended.  Normal bowel sounds.  Moderate right sided TTP. Musculoskeletal: Symmetrical with no gross deformities  Skin: No lesions on visible extremities Extremities: No edema  Neurological: Alert oriented x 4, grossly non-focal Psychological:  Alert and cooperative. Normal mood and affect  ASSESSMENT AND PLAN: -71 year old female with right sided abdominal pain, nausea, low-grade fever. She has right-sided diverticular disease and has been treated empirically in the past for right-sided diverticulitis. We do not have any radiographic evidence that she's ever had diverticulitis. Her symptoms have responded to antibiotics in the past. Her symptoms on this occasion feel exactly the same as previous. We will treat her with a ten-day course of Cipro 500 mg twice a day and Flagyl 500 mg twice a day. She will call our office back if symptoms do not  resolve with this treatment at which time we would consider another CT scan. In the meantime her symptoms worsen she is to go to the emergency department.   CC:  [name], MD

## 2017-03-21 NOTE — Progress Notes (Signed)
Reviewed and agree with initial management plan.  Ruqayyah Lute T. Hannalee Castor, MD FACG 

## 2017-03-24 ENCOUNTER — Ambulatory Visit: Payer: Medicare Other | Admitting: Physician Assistant

## 2017-03-24 ENCOUNTER — Telehealth: Payer: Self-pay | Admitting: Cardiovascular Disease

## 2017-03-24 DIAGNOSIS — I25810 Atherosclerosis of coronary artery bypass graft(s) without angina pectoris: Secondary | ICD-10-CM

## 2017-03-24 DIAGNOSIS — E1151 Type 2 diabetes mellitus with diabetic peripheral angiopathy without gangrene: Secondary | ICD-10-CM

## 2017-03-24 DIAGNOSIS — I119 Hypertensive heart disease without heart failure: Secondary | ICD-10-CM

## 2017-03-24 NOTE — Telephone Encounter (Signed)
New message    Pt calling asking if she needs labs done prior to her July 26th appt and if so, do they need to be done a week in advance?. She states that her insurance doesn't cover labs at the place she normally goes and wants to know if the labs can be done at our office.

## 2017-03-24 NOTE — Telephone Encounter (Signed)
Returned call to patient she will have fasting lab bmet,lipid,hepatic,a1c done at [facility] office lab 06/23/17.

## 2017-03-24 NOTE — Telephone Encounter (Signed)
This is actually a Dr. Martinique patient, for 6 mo f/u.  Will route to [place] for advice on previsit labwork, if indicated.

## 2017-03-28 ENCOUNTER — Telehealth: Payer: Self-pay

## 2017-03-28 DIAGNOSIS — R109 Unspecified abdominal pain: Secondary | ICD-10-CM

## 2017-03-29 NOTE — Telephone Encounter (Signed)
Left message for pt to call back  °

## 2017-03-30 NOTE — Telephone Encounter (Signed)
Pt finished cipro and flagyl yesterday (was on it for 13 days)  Still has right side abd pain.  It is better but not completely resolved.  She takes ASA because of back pain.  She is not sure if that helps the abd pain.  She states she would like to have a CT or MRI or something to determine what is going on.  Please advise

## 2017-03-30 NOTE — Telephone Encounter (Signed)
Please order CT scan abdomen and pelvis with contrast.

## 2017-03-30 NOTE — Telephone Encounter (Signed)
error 

## 2017-03-31 NOTE — Telephone Encounter (Signed)
Pt has been advised of the appt and will come in for labs prior to the appt.     Please have labs in our basement lab prior to appointment.  The lab is open 7:30 - 5 pm Monday- Friday.  No appointment is needed.     You have been scheduled for a CT scan of the abdomen and pelvis at [place] [facility] ([address]---this is in the same building as Press [facility]).   You are scheduled on 04/06/17 at 2 pm. You should arrive 15 minutes prior to your appointment time for registration. Please follow the written instructions below on the day of your exam:  WARNING: IF YOU ARE ALLERGIC TO IODINE/X-RAY DYE, PLEASE NOTIFY RADIOLOGY IMMEDIATELY AT [phone]! YOU WILL BE GIVEN A 13 HOUR PREMEDICATION PREP.  1) Do not eat or drink anything after 10 am (4 hours prior to your test) 2) You have been given 2 bottles of oral contrast to drink. The solution may taste better if refrigerated, but do NOT add ice or any other liquid to this solution. Shake well before drinking.    Drink 1 bottle of contrast @ 12 noon (2 hours prior to your exam)  Drink 1 bottle of contrast @ 1 pm (1 hour prior to your exam)  You may take any medications as prescribed with a small amount of water except for the following: Metformin, Glucophage, Glucovance, Avandamet, Riomet, Fortamet, Actoplus Met, Janumet, Glumetza or Metaglip. The above medications must be held the day of the exam AND 48 hours after the exam.  The purpose of you drinking the oral contrast is to aid in the visualization of your intestinal tract. The contrast solution may cause some diarrhea. Before your exam is started, you will be given a small amount of fluid to drink. Depending on your individual set of symptoms, you may also receive an intravenous injection of x-ray contrast/dye. Plan on being at [facility] for 30 minutes or longer, depending on the type of exam you are having performed.  This test typically takes 30-45 minutes to  complete.  If you have any questions regarding your exam or if you need to reschedule, you may call the CT department at [phone] between the hours of 8:00 am and 5:00 pm, Monday-Friday.  ______________________________________________

## 2017-03-31 NOTE — Telephone Encounter (Signed)
Left message on machine to call back  

## 2017-04-01 ENCOUNTER — Other Ambulatory Visit (INDEPENDENT_AMBULATORY_CARE_PROVIDER_SITE_OTHER): Payer: Medicare Other

## 2017-04-01 DIAGNOSIS — R109 Unspecified abdominal pain: Secondary | ICD-10-CM | POA: Diagnosis not present

## 2017-04-01 LAB — BUN: BUN: 10 mg/dL (ref 6–23)

## 2017-04-01 LAB — CREATININE, SERUM: Creatinine, Ser: 0.72 mg/dL (ref 0.40–1.20)

## 2017-04-05 ENCOUNTER — Ambulatory Visit: Payer: Medicare Other | Admitting: Vascular Surgery

## 2017-04-05 ENCOUNTER — Other Ambulatory Visit (HOSPITAL_COMMUNITY): Payer: Medicare Other

## 2017-04-06 ENCOUNTER — Ambulatory Visit (INDEPENDENT_AMBULATORY_CARE_PROVIDER_SITE_OTHER)
Admission: RE | Admit: 2017-04-06 | Discharge: 2017-04-06 | Disposition: A | Discharge status: Home / Self Care | Payer: Medicare Other | Source: Ambulatory Visit | Attending: Gastroenterology | Admitting: Gastroenterology

## 2017-04-06 DIAGNOSIS — R109 Unspecified abdominal pain: Secondary | ICD-10-CM | POA: Diagnosis not present

## 2017-04-06 MED ORDER — IOPAMIDOL (ISOVUE-300) INJECTION 61%
100.0000 mL | Freq: Once | INTRAVENOUS | Status: AC | PRN
  Administered 2017-04-06: 100 mL via INTRAVENOUS

## 2017-05-16 ENCOUNTER — Encounter: Payer: Self-pay | Admitting: Vascular Surgery

## 2017-05-22 ENCOUNTER — Other Ambulatory Visit: Payer: Self-pay | Admitting: Cardiology

## 2017-05-24 ENCOUNTER — Encounter: Payer: Self-pay | Admitting: Vascular Surgery

## 2017-05-24 ENCOUNTER — Ambulatory Visit (INDEPENDENT_AMBULATORY_CARE_PROVIDER_SITE_OTHER): Payer: Medicare Other | Admitting: Vascular Surgery

## 2017-05-24 VITALS — BP 141/76 | HR 66 | Temp 97.6°F | Resp 16 | Ht 60.5 in | Wt 160.0 lb

## 2017-05-24 DIAGNOSIS — I714 Abdominal aortic aneurysm, without rupture, unspecified: Secondary | ICD-10-CM

## 2017-05-24 NOTE — Progress Notes (Signed)
[facility] [place]  Patient name: Megan [name] MRN: [id number] DOB: Feb 21, 1946 Sex: female  REASON FOR VISIT: Follow-up stent graft repair abdominal aortic aneurysm  HPI: Megan [name] is a 71 y.o. female here today for follow-up. She is status post stent graft repair abdominal aortic aneurysm in 2009. She developed iliac artery stenosis and subsequently underwent iliac angioplasty and stenting in 2015. She reports occasionally having very mild claudication symptoms in her left which are not limiting to her. She recently had a flareup of chronic diverticulitis and underwent CT scan on 04/06/2017. I have this for review. She was scheduled for ultrasound today but this was canceled since she had a recent CT scan. She has no symptoms referable to her aneurysm and no new coronary disease  Past Medical History:  Diagnosis Date  . AAA (abdominal aortic aneurysm) ([place])    a. s/p stent grafting in 2009.  . Adenomatous colon polyp 05/2001  . Adenomatous duodenal polyp   . Anginal pain ([place])   . COPD (chronic obstructive pulmonary disease) ([place])   . Coronary artery disease    a. s/p CABG in 1995;  b. 05/2013 Neg MV, EF 82%;  c. 06/2013 Cath: LM nl, LAD 40-50p, LCX nl, RCA 80p/122m, VG->RCA->PDA->PLSA min irregs, LIMA->LAD atretic, vigorous LV fxn.  . Diabetes mellitus without complication ([place])   . Diverticulosis   . Eczema, dyshidrotic   . Fibromyalgia   . GERD (gastroesophageal reflux disease)   . Hyperlipidemia   . Hypertension   . Myocardial infarction ([place])   . Osteoarthritis   . Peripheral arterial disease (HCC)    left leg diagnosed by Dr Mare Ferrari  . Renal artery stenosis ([place])    a. 2008 s/p PTA    Family History  Problem Relation Age of Onset  . Heart disease Mother   . Heart attack Mother   . Heart disease Father        before age 44  . Heart attack Father   . Heart disease Sister        before age 104  .  Diabetes Sister   . Hyperlipidemia Sister   . Hypertension Sister   . Heart attack Sister   . Heart disease Brother   . Diabetes Brother   . Hyperlipidemia Brother   . Hypertension Brother   . Heart attack Brother   . AAA (abdominal aortic aneurysm) Brother   . Colon cancer Paternal Grandmother   . Liver cancer Sister   . Pancreatic cancer Neg Hx   . Rectal cancer Neg Hx   . Stomach cancer Neg Hx   . Esophageal cancer Neg Hx     SOCIAL HISTORY: Social History  Substance Use Topics  . Smoking status: Former Smoker    Types: Cigarettes    Quit date: 12/07/1995  . Smokeless tobacco: Never Used  . Alcohol use 3.6 - 6.0 oz/week    6 - 10 Glasses of wine per week     Comment: 8 drinks per wwek    Allergies  Allergen Reactions  . Anticoagulant Compound Itching and Other (See Comments)    Hands and feet tingle and itch. Hands and feet tingle and itch.  . Morphine And Related Nausea Only and Itching  . Bextra [Valdecoxib] Other (See Comments)    Increased lft's Increased lft's  . Hydrocod Polst-Cpm Polst Er Rash  . Lipitor [Atorvastatin] Other (See Comments)    Leg weakness Leg weakness  . Mevacor [Lovastatin] Other (  See Comments)    Muscle weakness Muscle weakness  . Plavix  [Clopidogrel Bisulfate] Itching and Other (See Comments)    Hands and feet tingle and itch.  . Zocor [Simvastatin] Other (See Comments)    Bones hurt Bones hurt   . Doxycycline Diarrhea    N/v/d  . Metoprolol Other (See Comments)    Hair loss  . Plavix [Clopidogrel Bisulfate] Other (See Comments)    Itching of hands and feet  . Codeine Itching and Rash    Hyper-active  . Hydrocod Polst-Cpm Polst Er Rash  . Lisinopril Cough    Current Outpatient Prescriptions  Medication Sig Dispense Refill  . ACCU-CHEK FASTCLIX LANCETS MISC   6  . ALPRAZolam (XANAX) 0.25 MG tablet Take 1 tablet (0.25 mg total) by mouth at bedtime as needed. 90 tablet 0  . amLODipine (NORVASC) 5 MG tablet Take 1 tablet (5  mg total) by mouth daily. 90 tablet 3  . aspirin 325 MG tablet Take 325 mg by mouth daily.    [name] atorvastatin (LIPITOR) 40 MG tablet TAKE 1 TABLET (40 MG TOTAL) BY MOUTH DAILY AT 6 PM. 90 tablet 2  . augmented betamethasone dipropionate (DIPROLENE-AF) 0.05 % cream Apply 1 application topically daily as needed. For hands  1  . ciclopirox (LOPROX) 0.77 % cream Apply topically as needed.    . clindamycin (CLEOCIN T) 1 % lotion Apply topically as needed.    . clobetasol cream (TEMOVATE) 3.54 % Apply 1 application topically 2 (two) times daily as needed.    . cycloSPORINE (RESTASIS) 0.05 % ophthalmic emulsion Place 1 drop into both eyes at bedtime.    [name] estradiol (ESTRACE) 1 MG tablet Take 1 mg by mouth daily.    . hydrALAZINE (APRESOLINE) 10 MG tablet Take 1 tablet (10 mg total) by mouth 3 (three) times daily. 270 tablet 3  . KLOR-CON 10 10 MEQ tablet TAKE 2 TABLET BY MOUTH TWICE A DAY 120 tablet 3  . losartan-hydrochlorothiazide (HYZAAR) 100-25 MG tablet TAKE 1 TABLET BY MOUTH DAILY. 90 tablet 3  . metroNIDAZOLE (FLAGYL) 500 MG tablet Take 1 tablet (500 mg total) by mouth 2 (two) times daily. 60 tablet 0  . nitroGLYCERIN (NITROSTAT) 0.4 MG SL tablet Place 1 tablet (0.4 mg total) under the tongue every 5 (five) minutes as needed for chest pain. 25 tablet PRN  . Omega-3 Fatty Acids (FISH OIL) 1000 MG CAPS Take 2,000 mg by mouth 2 (two) times daily.    [name] omeprazole (PRILOSEC) 40 MG capsule TAKE 1 CAPSULE (40 MG TOTAL) BY MOUTH 2 (TWO) TIMES DAILY. 60 capsule 11  . PRENATAL VITAMINS PO Take 1 tablet by mouth at bedtime.     . torsemide (DEMADEX) 20 MG tablet Take 0.5 tablets (10 mg total) by mouth daily as needed (for fluid). 30 tablet 4  . tretinoin (RETIN-A) 0.1 % cream Apply 1 application topically every other day.     No current facility-administered medications for this visit.     REVIEW OF SYSTEMS:  [X]  denotes positive finding, [ ]  denotes negative finding Cardiac  Comments:  Chest pain or  chest pressure:    Shortness of breath upon exertion:    Short of breath when lying flat:    Irregular heart rhythm:        Vascular    Pain in calf, thigh, or hip brought on by ambulation: x   Pain in feet at night that wakes you up from your sleep:  Blood clot in your veins:    Leg swelling:           PHYSICAL EXAM: Vitals:   05/24/17 0942 05/24/17 0950  BP: (!) 157/70 (!) 141/76  Pulse: 66   Resp: 16   Temp: 97.6 F (36.4 C)   TempSrc: Oral   SpO2: 96%   Weight: 160 lb (72.6 kg)   Height: 5' 0.5" (1.537 m)     GENERAL: The patient is a well-nourished female, in no acute distress. The vital signs are documented above. CARDIOVASCULAR: 2+ radial and 2-3+ dorsalis pedis pulses bilaterally. Abdomen soft and nontender with no palpable aneurysm PULMONARY: There is good air exchange  MUSCULOSKELETAL: There are no major deformities or cyanosis. NEUROLOGIC: No focal weakness or paresthesias are detected. SKIN: There are no ulcers or rashes noted. PSYCHIATRIC: The patient has a normal affect.  DATA:  CT scan of her abdomen from 04/06/2017 shows slightly diminished maximal aneurysm diameter to 4.4.1 centimeters.  MEDICAL ISSUES: Stable overall. We'll continue her walking program. We will see her again in one year with ultrasound follow-up    Rosetta Posner, MD [facility] Tel [phone] Pager [phone]

## 2017-05-31 NOTE — Addendum Note (Signed)
Addended by: Lianne Cure A on: 05/31/2017 03:55 PM   Modules accepted: Orders

## 2017-06-14 ENCOUNTER — Other Ambulatory Visit (HOSPITAL_COMMUNITY): Payer: Medicare Other

## 2017-06-14 ENCOUNTER — Ambulatory Visit: Payer: Medicare Other | Admitting: Vascular Surgery

## 2017-06-22 ENCOUNTER — Other Ambulatory Visit: Payer: Self-pay | Admitting: Otolaryngology

## 2017-06-22 DIAGNOSIS — H9121 Sudden idiopathic hearing loss, right ear: Secondary | ICD-10-CM

## 2017-06-23 ENCOUNTER — Other Ambulatory Visit: Payer: Medicare Other | Admitting: *Deleted

## 2017-06-23 DIAGNOSIS — E1151 Type 2 diabetes mellitus with diabetic peripheral angiopathy without gangrene: Secondary | ICD-10-CM

## 2017-06-23 DIAGNOSIS — I25810 Atherosclerosis of coronary artery bypass graft(s) without angina pectoris: Secondary | ICD-10-CM

## 2017-06-23 DIAGNOSIS — I119 Hypertensive heart disease without heart failure: Secondary | ICD-10-CM

## 2017-06-23 LAB — BASIC METABOLIC PANEL
BUN/Creatinine Ratio: 18 (ref 12–28)
BUN: 14 mg/dL (ref 8–27)
CO2: 23 mmol/L (ref 20–29)
Calcium: 10.2 mg/dL (ref 8.7–10.3)
Chloride: 100 mmol/L (ref 96–106)
Creatinine, Ser: 0.79 mg/dL (ref 0.57–1.00)
GFR calc Af Amer: 86 mL/min/{1.73_m2} (ref >59)
GFR calc non Af Amer: 75 mL/min/{1.73_m2} (ref >59)
Glucose: 97 mg/dL (ref 65–99)
Potassium: 4.2 mmol/L (ref 3.5–5.2)
Sodium: 141 mmol/L (ref 134–144)

## 2017-06-23 LAB — LIPID PANEL
Chol/HDL Ratio: 2.4 ratio (ref 0.0–4.4)
Cholesterol, Total: 153 mg/dL (ref 100–199)
HDL: 64 mg/dL (ref >39)
LDL Calculated: 55 mg/dL (ref 0–99)
Triglycerides: 172 mg/dL — ABNORMAL HIGH (ref 0–149)
VLDL Cholesterol Cal: 34 mg/dL (ref 5–40)

## 2017-06-23 LAB — HEPATIC FUNCTION PANEL
ALT: 63 IU/L — ABNORMAL HIGH (ref 0–32)
AST: 41 IU/L — ABNORMAL HIGH (ref 0–40)
Albumin: 4.3 g/dL (ref 3.5–4.8)
Alkaline Phosphatase: 65 IU/L (ref 39–117)
Bilirubin Total: 0.5 mg/dL (ref 0.0–1.2)
Bilirubin, Direct: 0.16 mg/dL (ref 0.00–0.40)
Total Protein: 6.2 g/dL (ref 6.0–8.5)

## 2017-06-23 NOTE — Progress Notes (Deleted)
Cardiology Office Note   Date:  06/23/2017   ID:  Megan [name], DOB [birth year], MRN [id number]  PCP:  [name], MD  Cardiologist: Dayne Chait Martinique MD  No chief complaint on file.     History of Present Illness: Megan [name] is a 71 y.o. female who is seen for follow up of CAD, HTN, HL, and PAD.   She has a past history of essential hypertension and  coronary artery disease. Also has a history of dyslipidemia. She is status post coronary artery bypass graft surgery in 1995. She had a renal artery stent for renal artery stenosis in 2008. She had a stent graft of an abdominal aortic aneurysm 2009. She is s/p stenting of the left iliac. She is followed by [name] for PAD. She has had chronic mild elevation of liver function enzymes secondary to modest alcohol intake and to fatty liver.   She was hospitalized in July 2014 and had cardiac catheterization which showed that her grafts were open. There was no evidence of myocardial damage. She was hospitalized 11/15/2014 with which he describes as an aching sensation in her chest.  She underwent a treadmill Myoview on 11/17/14 which showed no ischemia and her ejection fraction was 86%.  On follow up today she is doing well. She has  some chronic exertional dyspnea climbing hills. She thinks this is a little worse. She has not been experiencing any palpitations dizziness or syncope. She notes occasional "prickly" pain in left chest not associated with activity. Walks at least a mile/day. She has gained weight since her last visit. 9 lbs. She is on hormonal therapy now but really hasn't noted any improvement in sweats.   Past Medical History:  Diagnosis Date  . AAA (abdominal aortic aneurysm) ([place])    a. s/p stent grafting in 2009.  . Adenomatous colon polyp 05/2001  . Adenomatous duodenal polyp   . Anginal pain ([place])   . COPD (chronic obstructive pulmonary disease) ([place])   . Coronary artery disease    a. s/p CABG in 1995;  b.  05/2013 Neg MV, EF 82%;  c. 06/2013 Cath: LM nl, LAD 40-50p, LCX nl, RCA 80p/128m, VG->RCA->PDA->PLSA min irregs, LIMA->LAD atretic, vigorous LV fxn.  . Diabetes mellitus without complication ([place])   . Diverticulosis   . Eczema, dyshidrotic   . Fibromyalgia   . GERD (gastroesophageal reflux disease)   . Hyperlipidemia   . Hypertension   . Myocardial infarction ([place])   . Osteoarthritis   . Peripheral arterial disease (HCC)    left leg diagnosed by Dr Mare Ferrari  . Renal artery stenosis ([place])    a. 2008 s/p PTA    Past Surgical History:  Procedure Laterality Date  . ABDOMINAL AORTAGRAM N/A 03/06/2014   Procedure: ABDOMINAL AORTAGRAM;  Surgeon: Rosetta Posner, MD;  Location: [facility] CATH LAB;  Service: Cardiovascular;  Laterality: N/A;  . ABDOMINAL AORTIC ANEURYSM REPAIR  2009   stenting  . CARDIAC CATHETERIZATION    . CARDIOVASCULAR STRESS TEST  11/11/2009   normal study  . CARPAL TUNNEL RELEASE  08/1993   left wrist  . CARPAL TUNNEL RELEASE  01/1997   right  . CORONARY ARTERY BYPASS GRAFT  07/1994   Triple by Dr Ceasar Mons  . LEFT HEART CATHETERIZATION WITH CORONARY/GRAFT ANGIOGRAM N/A 06/11/2013   Procedure: LEFT HEART CATHETERIZATION WITH [name];  Surgeon: Thayer Headings, MD;  Location: [facility] CATH LAB;  Service: Cardiovascular;  Laterality: N/A;  . RENAL ARTERY  STENT  2008  . thyroid cyst apiration  05/1997 and 07/1998  . TONSILECTOMY, ADENOIDECTOMY, BILATERAL MYRINGOTOMY AND TUBES  1989  . TOTAL ABDOMINAL HYSTERECTOMY  1992     Current Outpatient Prescriptions  Medication Sig Dispense Refill  . ACCU-CHEK FASTCLIX LANCETS MISC   6  . ALPRAZolam (XANAX) 0.25 MG tablet Take 1 tablet (0.25 mg total) by mouth at bedtime as needed. 90 tablet 0  . amLODipine (NORVASC) 5 MG tablet Take 1 tablet (5 mg total) by mouth daily. 90 tablet 3  . aspirin 325 MG tablet Take 325 mg by mouth daily.    [name] atorvastatin (LIPITOR) 40 MG tablet TAKE 1 TABLET (40 MG TOTAL) BY MOUTH DAILY AT 6 PM.  90 tablet 2  . augmented betamethasone dipropionate (DIPROLENE-AF) 0.05 % cream Apply 1 application topically daily as needed. For hands  1  . ciclopirox (LOPROX) 0.77 % cream Apply topically as needed.    . clindamycin (CLEOCIN T) 1 % lotion Apply topically as needed.    . clobetasol cream (TEMOVATE) 5.63 % Apply 1 application topically 2 (two) times daily as needed.    . cycloSPORINE (RESTASIS) 0.05 % ophthalmic emulsion Place 1 drop into both eyes at bedtime.    [name] estradiol (ESTRACE) 1 MG tablet Take 1 mg by mouth daily.    . hydrALAZINE (APRESOLINE) 10 MG tablet Take 1 tablet (10 mg total) by mouth 3 (three) times daily. 270 tablet 3  . KLOR-CON 10 10 MEQ tablet TAKE 2 TABLET BY MOUTH TWICE A DAY 120 tablet 3  . losartan-hydrochlorothiazide (HYZAAR) 100-25 MG tablet TAKE 1 TABLET BY MOUTH DAILY. 90 tablet 3  . metroNIDAZOLE (FLAGYL) 500 MG tablet Take 1 tablet (500 mg total) by mouth 2 (two) times daily. 60 tablet 0  . nitroGLYCERIN (NITROSTAT) 0.4 MG SL tablet Place 1 tablet (0.4 mg total) under the tongue every 5 (five) minutes as needed for chest pain. 25 tablet PRN  . Omega-3 Fatty Acids (FISH OIL) 1000 MG CAPS Take 2,000 mg by mouth 2 (two) times daily.    [name] omeprazole (PRILOSEC) 40 MG capsule TAKE 1 CAPSULE (40 MG TOTAL) BY MOUTH 2 (TWO) TIMES DAILY. 60 capsule 11  . PRENATAL VITAMINS PO Take 1 tablet by mouth at bedtime.     . torsemide (DEMADEX) 20 MG tablet Take 0.5 tablets (10 mg total) by mouth daily as needed (for fluid). 30 tablet 4  . tretinoin (RETIN-A) 0.1 % cream Apply 1 application topically every other day.     No current facility-administered medications for this visit.     Allergies:   Anticoagulant compound; Morphine and related; Bextra [valdecoxib]; Hydrocod polst-cpm polst er; Lipitor [atorvastatin]; Mevacor [lovastatin]; Plavix  [clopidogrel bisulfate]; Zocor [simvastatin]; Doxycycline; Metoprolol; Plavix [clopidogrel bisulfate]; Codeine; Hydrocod polst-cpm polst er;  and Lisinopril    Social History:  The patient  reports that she quit smoking about 21 years ago. Her smoking use included Cigarettes. She has never used smokeless tobacco. She reports that she drinks about 3.6 - 6.0 oz of alcohol per week . She reports that she does not use drugs.   Family History:  The patient's family history includes AAA (abdominal aortic aneurysm) in her brother; Colon cancer in her paternal grandmother; Diabetes in her brother and sister; Heart attack in her brother, father, mother, and sister; Heart disease in her brother, father, mother, and sister; Hyperlipidemia in her brother and sister; Hypertension in her brother and sister; Liver cancer in her sister.  ROS:  Please see the history of present illness.   Otherwise, review of systems are positive for none.   All other systems are reviewed and negative.    PHYSICAL EXAM: VS:  There were no vitals taken for this visit. , BMI There is no height or weight on file to calculate BMI. GEN: Well nourished, well developed, in no acute distress  HEENT: normal  Neck: no JVD, carotid bruits, or masses Cardiac: RRR; no murmurs, rubs, or gallops,no edema  Femoral and pedal pulses are good.  Respiratory:  clear to auscultation bilaterally, normal work of breathing GI: soft, nontender, nondistended, + BS, no masses.  MS: no deformity or atrophy  Skin: warm and dry, no rash Neuro:  Strength and sensation are intact Psych: euthymic mood, full affect   EKG:  EKG today shows NSR with nonspecific TWA. I have personally reviewed and interpreted this study.     Recent Labs: 06/29/2016: ALT 57; Hemoglobin 15.0; Platelets 192; Potassium 4.3; Sodium 142 04/01/2017: BUN 10; Creatinine, Ser 0.72   Labs from primary care 12/15/16: A1c 5.6%. : Cholesterol 167 Trig-209, HDL 52, LDL 73.  AST 51, ALT 65. Normal renal indices. TSH is normal.  Lipid Panel    Component Value Date/Time   CHOL 187 06/29/2016 1054   TRIG 234 (H) 06/29/2016  1054   HDL 59 06/29/2016 1054   CHOLHDL 3.2 06/29/2016 1054   VLDL 47 (H) 06/29/2016 1054   LDLCALC 81 06/29/2016 1054   LDLDIRECT 87.4 12/09/2014 0808      Wt Readings from Last 3 Encounters:  05/24/17 160 lb (72.6 kg)  03/16/17 163 lb (73.9 kg)  01/03/17 163 lb (73.9 kg)      ASSESSMENT AND PLAN:  1.CAD status post CABG in 1995. Most recent cardiac cath in July 2014 showed no recurrent stenoses and patent grafts. Most recent treadmill Myoview 11/17/14 normal with ejection fraction 86%. She is asymptomatic.  2. HTN with hypertensive heart disease. BP is under good control. 3. Hypercholesterolemia- well controlled on lipitor 4. Abnormal liver function studies with mild transaminase elevation.  Needs to continue to limit alcohol 5. Status post stent to right renal artery 2008. Patent by CT in May 2018.  6. Status post stent graft to abdominal aortic aneurysm 2009.  S/p iliac stent in 2016. Stable by CT 7. Peripheral artery disease followed by [name] 8. Dyspnea on exertion. I think this is predominantly related to her prior smoking history and weight gain. Try and focus on weight loss.  Current medicines are reviewed at length with the patient today.  The patient does not have concerns regarding medicines.  The following changes have been made:  no change  Labs/ tests ordered today include:   No orders of the defined types were placed in this encounter.   Disposition: Continue current medication.  Recheck in 6 months for follow-up office visit.    Signed, Lyrik Buresh Martinique MD, [facility]    06/23/2017 11:14 AM    [place]

## 2017-06-24 LAB — HEMOGLOBIN A1C
Est. average glucose Bld gHb Est-mCnc: 123 mg/dL
Hgb A1c MFr Bld: 5.9 % — ABNORMAL HIGH (ref 4.8–5.6)

## 2017-06-30 ENCOUNTER — Other Ambulatory Visit: Payer: Self-pay

## 2017-06-30 ENCOUNTER — Ambulatory Visit (INDEPENDENT_AMBULATORY_CARE_PROVIDER_SITE_OTHER): Payer: Medicare Other | Admitting: Cardiology

## 2017-06-30 ENCOUNTER — Ambulatory Visit: Payer: Medicare Other | Admitting: Cardiology

## 2017-06-30 ENCOUNTER — Encounter: Payer: Self-pay | Admitting: Cardiology

## 2017-06-30 VITALS — BP 154/72 | HR 63 | Ht 60.0 in | Wt 152.4 lb

## 2017-06-30 DIAGNOSIS — I119 Hypertensive heart disease without heart failure: Secondary | ICD-10-CM

## 2017-06-30 DIAGNOSIS — I25810 Atherosclerosis of coronary artery bypass graft(s) without angina pectoris: Secondary | ICD-10-CM | POA: Diagnosis not present

## 2017-06-30 DIAGNOSIS — E785 Hyperlipidemia, unspecified: Secondary | ICD-10-CM

## 2017-06-30 DIAGNOSIS — E1151 Type 2 diabetes mellitus with diabetic peripheral angiopathy without gangrene: Secondary | ICD-10-CM

## 2017-06-30 DIAGNOSIS — I739 Peripheral vascular disease, unspecified: Secondary | ICD-10-CM | POA: Diagnosis not present

## 2017-06-30 DIAGNOSIS — F419 Anxiety disorder, unspecified: Secondary | ICD-10-CM

## 2017-06-30 MED ORDER — ALPRAZOLAM 0.25 MG PO TABS: 0.2500 mg | ORAL_TABLET | Freq: Every evening | ORAL | 1 refills | Status: DC | PRN

## 2017-06-30 NOTE — Progress Notes (Signed)
Cardiology Office Note   Date:  06/30/2017   ID:  Megan [name], Megan [name] Mar 04, 1946, MRN [id number]  PCP:  [name], MD  Cardiologist: Raini Tiley Martinique MD  Chief Complaint  Patient presents with  . Follow-up    6 months;      History of Present Illness: Megan [name] is a 71 y.o. female who is seen for follow up of CAD, HTN, HL, and PAD.   She has a past history of essential hypertension and  coronary artery disease. Also has a history of dyslipidemia. She is status post coronary artery bypass graft surgery in 1995. She had a renal artery stent for renal artery stenosis in 2008. She had a stent graft of an abdominal aortic aneurysm 2009. She is s/p stenting of the left iliac. She is followed by [name] for PAD. Last CT in May 2018. She has had chronic mild elevation of liver function enzymes secondary to modest alcohol intake and to fatty liver.   She was hospitalized in July 2014 and had cardiac catheterization which showed that her grafts were open. EF normal.  She was hospitalized 11/15/2014 with which he describes as an aching sensation in her chest.  She underwent a treadmill Myoview on 11/17/14 which showed no ischemia and her ejection fraction was 86%.  On follow up today she is doing well from a cardiac standpoint. She has  some chronic exertional dyspnea climbing hills. This is unchanged.  She has not been experiencing any palpitations dizziness or syncope. Walks at least a mile/day. She has lost 11 lbs since her last visit.  She has recently had dental implants. Last week developed acute HA and decreased hearing in ear. Also had recent tick bite. Treated with steroids and antibiotics. Serology for tick borne diseases negative. MRI planned next week. She does have mild intermittent edema and takes demadex prn.  Past Medical History:  Diagnosis Date  . AAA (abdominal aortic aneurysm) ([place])    a. s/p stent grafting in 2009.  . Adenomatous colon polyp 05/2001  .  Adenomatous duodenal polyp   . Anginal pain ([place])   . COPD (chronic obstructive pulmonary disease) ([place])   . Coronary artery disease    a. s/p CABG in 1995;  b. 05/2013 Neg MV, EF 82%;  c. 06/2013 Cath: LM nl, LAD 40-50p, LCX nl, RCA 80p/161m, VG->RCA->PDA->PLSA min irregs, LIMA->LAD atretic, vigorous LV fxn.  . Diabetes mellitus without complication ([place])   . Diverticulosis   . Eczema, dyshidrotic   . Fibromyalgia   . GERD (gastroesophageal reflux disease)   . Hyperlipidemia   . Hypertension   . Myocardial infarction ([place])   . Osteoarthritis   . Peripheral arterial disease ([facility])    left leg diagnosed by Dr Mare Ferrari  . Renal artery stenosis ([place])    a. 2008 s/p PTA    Past Surgical History:  Procedure Laterality Date  . ABDOMINAL AORTAGRAM N/A 03/06/2014   Procedure: ABDOMINAL AORTAGRAM;  Surgeon: Rosetta Posner, MD;  Location: [facility] CATH LAB;  Service: Cardiovascular;  Laterality: N/A;  . ABDOMINAL AORTIC ANEURYSM REPAIR  2009   stenting  . CARDIAC CATHETERIZATION    . CARDIOVASCULAR STRESS TEST  11/11/2009   normal study  . CARPAL TUNNEL RELEASE  08/1993   left wrist  . CARPAL TUNNEL RELEASE  01/1997   right  . CORONARY ARTERY BYPASS GRAFT  07/1994   Triple by Dr Ceasar Mons  . LEFT HEART CATHETERIZATION WITH CORONARY/GRAFT ANGIOGRAM N/A  06/11/2013   Procedure: LEFT HEART CATHETERIZATION WITH [name];  Surgeon: Thayer Headings, MD;  Location: [facility] CATH LAB;  Service: Cardiovascular;  Laterality: N/A;  . RENAL ARTERY STENT  2008  . thyroid cyst apiration  05/1997 and 07/1998  . TONSILECTOMY, ADENOIDECTOMY, BILATERAL MYRINGOTOMY AND TUBES  1989  . TOTAL ABDOMINAL HYSTERECTOMY  1992     Current Outpatient Prescriptions  Medication Sig Dispense Refill  . ACCU-CHEK FASTCLIX LANCETS MISC   6  . ALPRAZolam (XANAX) 0.25 MG tablet Take 1 tablet (0.25 mg total) by mouth at bedtime as needed. 90 tablet 0  . amLODipine (NORVASC) 5 MG tablet Take 1 tablet (5 mg total) by mouth  daily. 90 tablet 3  . aspirin 325 MG tablet Take 325 mg by mouth daily.    [name] atorvastatin (LIPITOR) 40 MG tablet TAKE 1 TABLET (40 MG TOTAL) BY MOUTH DAILY AT 6 PM. 90 tablet 2  . augmented betamethasone dipropionate (DIPROLENE-AF) 0.05 % cream Apply 1 application topically daily as needed. For hands  1  . ciclopirox (LOPROX) 0.77 % cream Apply topically as needed.    . clindamycin (CLEOCIN T) 1 % lotion Apply topically as needed.    . clobetasol cream (TEMOVATE) 8.41 % Apply 1 application topically 2 (two) times daily as needed.    . cycloSPORINE (RESTASIS) 0.05 % ophthalmic emulsion Place 1 drop into both eyes at bedtime.    [name] estradiol (ESTRACE) 1 MG tablet Take 1 mg by mouth daily.    . hydrALAZINE (APRESOLINE) 10 MG tablet Take 1 tablet (10 mg total) by mouth 3 (three) times daily. 270 tablet 3  . KLOR-CON 10 10 MEQ tablet TAKE 2 TABLET BY MOUTH TWICE A DAY 120 tablet 3  . losartan-hydrochlorothiazide (HYZAAR) 100-25 MG tablet TAKE 1 TABLET BY MOUTH DAILY. 90 tablet 3  . metroNIDAZOLE (FLAGYL) 500 MG tablet Take 1 tablet (500 mg total) by mouth 2 (two) times daily. 60 tablet 0  . nitroGLYCERIN (NITROSTAT) 0.4 MG SL tablet Place 1 tablet (0.4 mg total) under the tongue every 5 (five) minutes as needed for chest pain. 25 tablet PRN  . Omega-3 Fatty Acids (FISH OIL) 1000 MG CAPS Take 2,000 mg by mouth 2 (two) times daily.    [name] omeprazole (PRILOSEC) 40 MG capsule TAKE 1 CAPSULE (40 MG TOTAL) BY MOUTH 2 (TWO) TIMES DAILY. 60 capsule 11  . PRENATAL VITAMINS PO Take 1 tablet by mouth at bedtime.     . torsemide (DEMADEX) 20 MG tablet Take 0.5 tablets (10 mg total) by mouth daily as needed (for fluid). 30 tablet 4  . tretinoin (RETIN-A) 0.1 % cream Apply 1 application topically every other day.     No current facility-administered medications for this visit.     Allergies:   Anticoagulant compound; Morphine and related; Bextra [valdecoxib]; Hydrocod polst-cpm polst er; Lipitor [atorvastatin];  Mevacor [lovastatin]; Plavix  [clopidogrel bisulfate]; Zocor [simvastatin]; Doxycycline; Metoprolol; Plavix [clopidogrel bisulfate]; Codeine; Hydrocod polst-cpm polst er; and Lisinopril    Social History:  The patient  reports that she quit smoking about 21 years ago. Her smoking use included Cigarettes. She has never used smokeless tobacco. She reports that she drinks about 3.6 - 6.0 oz of alcohol per week . She reports that she does not use drugs.   Family History:  The patient's family history includes AAA (abdominal aortic aneurysm) in her brother; Colon cancer in her paternal grandmother; Diabetes in her brother and sister; Heart attack in her brother, father, mother,  and sister; Heart disease in her brother, father, mother, and sister; Hyperlipidemia in her brother and sister; Hypertension in her brother and sister; Liver cancer in her sister.    ROS:  Please see the history of present illness.   Otherwise, review of systems are positive for none.   All other systems are reviewed and negative.    PHYSICAL EXAM: VS:  BP (!) 154/72   Pulse 63   Ht 5' (1.524 m)   Wt 152 lb 6.4 oz (69.1 kg)   BMI 29.76 kg/m  , BMI Body mass index is 29.76 kg/m. GEN: Well nourished, well developed, in no acute distress  HEENT: normal  Neck: no JVD, carotid bruits, or masses Cardiac: RRR; no murmurs, rubs, or gallops,no edema  Femoral and pedal pulses are excellent  Respiratory:  clear to auscultation bilaterally, normal work of breathing GI: soft, nontender, nondistended, + BS, no masses.  MS: no deformity or atrophy  Skin: warm and dry, no rash Neuro:  Strength and sensation are intact Psych: euthymic mood, full affect   EKG:  EKG not done today.   Recent Labs: 06/23/2017: ALT 63; BUN 14; Creatinine, Ser 0.79; Potassium 4.2; Sodium 141   Labs from primary care 12/15/16: A1c 5.6%. : Cholesterol 167 Trig-209, HDL 52, LDL 73.  AST 51, ALT 65. Normal renal indices. TSH is normal.  Lipid Panel      Component Value Date/Time   CHOL 153 06/23/2017 0742   TRIG 172 (H) 06/23/2017 0742   HDL 64 06/23/2017 0742   CHOLHDL 2.4 06/23/2017 0742   CHOLHDL 3.2 06/29/2016 1054   VLDL 47 (H) 06/29/2016 1054   LDLCALC 55 06/23/2017 0742   LDLDIRECT 87.4 12/09/2014 0808      Wt Readings from Last 3 Encounters:  06/30/17 152 lb 6.4 oz (69.1 kg)  05/24/17 160 lb (72.6 kg)  03/16/17 163 lb (73.9 kg)      ASSESSMENT AND PLAN:  1.CAD status post CABG in 1995. Most recent cardiac cath in July 2014 showed no recurrent stenoses and patent grafts. Most recent treadmill Myoview 11/17/14 normal with ejection fraction 86%. She is asymptomatic. She notes that prior stress tests really haven't been very helpful. Will just monitor symptoms for now.  2. HTN with hypertensive heart disease. BP is under good control. 3. Hypercholesterolemia- excellent control on lipitor 4. Abnormal liver function studies with mild transaminase elevation.  Last labs stable.  5. Status post stent to right renal artery 2008. Patent by CT in May 6. Status post stent graft to abdominal aortic aneurysm 2009.  S/p iliac stent in 2016. Stable by CT in May.  7. Peripheral artery disease followed by [name] 8. HA - follow up MRI.   Current medicines are reviewed at length with the patient today.  The patient does not have concerns regarding medicines.  Disposition: Continue current medication.  Recheck in 6 months for follow-up office visit.    Signed, Sarabeth Benton Martinique MD, [facility]    06/30/2017 9:59 AM    [place]

## 2017-06-30 NOTE — Patient Instructions (Signed)
Continue your current therapy  I will see you in 6 months.   

## 2017-07-03 ENCOUNTER — Ambulatory Visit
Admission: RE | Admit: 2017-07-03 | Discharge: 2017-07-03 | Disposition: A | Discharge status: Home / Self Care | Payer: Medicare Other | Source: Ambulatory Visit | Attending: Otolaryngology | Admitting: Otolaryngology

## 2017-07-03 DIAGNOSIS — H9121 Sudden idiopathic hearing loss, right ear: Secondary | ICD-10-CM

## 2017-07-03 MED ORDER — GADOBENATE DIMEGLUMINE 529 MG/ML IV SOLN
14.0000 mL | Freq: Once | INTRAVENOUS | Status: AC | PRN
  Administered 2017-07-03: 14 mL via INTRAVENOUS

## 2017-07-07 ENCOUNTER — Other Ambulatory Visit: Payer: Medicare Other

## 2017-07-25 ENCOUNTER — Other Ambulatory Visit: Payer: Self-pay | Admitting: Family Medicine

## 2017-07-25 DIAGNOSIS — Z1231 Encounter for screening mammogram for malignant neoplasm of breast: Secondary | ICD-10-CM

## 2017-07-31 ENCOUNTER — Other Ambulatory Visit: Payer: Self-pay | Admitting: Gastroenterology

## 2017-08-09 ENCOUNTER — Encounter: Payer: Self-pay | Admitting: *Deleted

## 2017-08-10 ENCOUNTER — Encounter: Payer: Self-pay | Admitting: Diagnostic Neuroimaging

## 2017-08-10 ENCOUNTER — Ambulatory Visit (INDEPENDENT_AMBULATORY_CARE_PROVIDER_SITE_OTHER): Payer: Medicare Other | Admitting: Diagnostic Neuroimaging

## 2017-08-10 VITALS — BP 139/81 | HR 84 | Ht 60.0 in | Wt 155.8 lb

## 2017-08-10 DIAGNOSIS — R269 Unspecified abnormalities of gait and mobility: Secondary | ICD-10-CM | POA: Diagnosis not present

## 2017-08-10 DIAGNOSIS — G43109 Migraine with aura, not intractable, without status migrainosus: Secondary | ICD-10-CM | POA: Diagnosis not present

## 2017-08-10 MED ORDER — TOPIRAMATE 25 MG PO TABS: 25.0000 mg | ORAL_TABLET | Freq: Two times a day (BID) | ORAL | 6 refills | Status: DC

## 2017-08-10 NOTE — Patient Instructions (Signed)
Thank you for coming to see [place] at [facility]. I hope we have been able to provide you high quality care today.  You may receive a patient satisfaction survey over the next few weeks. We would appreciate your feedback and comments so that we may continue to improve ourselves and the health of our patients.  MIGRAINE WITH AURA - start topiramate 17m at bedtime x 1 week; then increase to twice a day - use tylenol / ibuprofen as needed for breakthrough headache - consider migraine IV infusion if needed    ~~~~~~~~~~~~~~~~~~~~~~~~~~~~~~~~~~~~~~~~~~~~~~~~~~~~~~~~~~~~~~~~~  DR. PENUMALLI'S GUIDE TO HAPPY AND HEALTHY LIVING These are some of my general health and wellness recommendations. Some of them may apply to you better than others. Please use common sense as you try these suggestions and feel free to ask me any questions.   ACTIVITY/FITNESS Mental, social, emotional and physical stimulation are very important for brain and body health. Try learning a new activity (arts, music, language, sports, games).  Keep moving your body to the best of your abilities. You can do this at home, inside or outside, the park, community center, gym or anywhere you like. Consider a physical therapist or personal trainer to get started. Consider the app Sworkit. Fitness trackers such as smart-watches, smart-phones or Fitbits can help as well.   NUTRITION Eat more plants: colorful vegetables, nuts, seeds and berries.  Eat less sugar, salt, preservatives and processed foods.  Avoid toxins such as cigarettes and alcohol.  Drink water when you are thirsty. Warm water with a slice of lemon is an excellent morning drink to start the day.  Consider these websites for more information The Nutrition Source (hhttps://www.henry-hernandez.biz/ [employer] Nutrition (wWindowBlog.ch   RELAXATION Consider practicing mindfulness meditation or other  relaxation techniques such as deep breathing, prayer, yoga, tai chi, massage. See website mindful.org or the apps Headspace or Calm to help get started.   SLEEP Try to get at least 7-8+ hours sleep per day. Regular exercise and reduced caffeine will help you sleep better. Practice good sleep hygeine techniques. See website sleep.org for more information.   PLANNING Prepare estate planning, living will, healthcare POA documents. Sometimes this is best planned with the help of an attorney. Theconversationproject.org and agingwithdignity.org are excellent resources.\

## 2017-08-10 NOTE — Progress Notes (Signed)
[facility]  PATIENT: Megan [name] DOB: [birth year]  REFERRING CLINICIAN: Fredirick Maudlin HISTORY FROM: patient  REASON FOR VISIT: new consult    HISTORICAL  CHIEF COMPLAINT:  Chief Complaint  Patient presents with  . NP Thornell Mule  . Headache    5 wks, stopped for 10 days and now has restarted.  Brought med records, and MRI CD.  Also treated for tick bite recently.  Dental implant surgery 04/2017    HISTORY OF PRESENT ILLNESS:   71 year old female here for evaluation of headaches.  Patient had onset of headaches around age 66 years old, sometimes preceded by seeing wavy lines of water. Headaches would be unilateral or bilateral, sometimes associated with unilateral hemibody numbness, sometimes a fascia, with headaches lasting up to one dated time. Patient had one or 2 headaches per year. After she had a hysterectomy in 1992 her headaches significant improved. Patient was never officially diagnosed with migraine headaches but suspected that she had migraines due to her sister's history of migraines and her paternal grandmother's history of migraines. Patient never tried prescription migraine medication.  06/15/2017 patient had cold sensation and chills. She then had headache which continued until the next day. This reminded her of her previous headaches earlier in life. Headache recurred another day later and lasted for 5 weeks continuously. She had 2 episodes of visual aura. She then had headaches from August 20 until the 30th. She had another headache from September 2 of September 5. No nausea vomiting. No photophobia or phonophobia. She has had off-balance sensation. She has had left-sided headaches ranging from 3 out of 10 in severity. She has tried Midrin and Fioricet without relief. She is tried ibuprofen and Tylenol without relief.  Initially patient cannot identify any triggering factors. However 1 week prior to onset of headaches she had a tick bite. Also starting in May  2018 through July 2018, patient had significant dental and oral surgery procedures including 5 oral surgery implants as well as a bone spur removal. She has been left with residual post surgical numbness in her right lower lip and chin.  Also in the end of July 2018 patient had sudden onset of hearing loss in her right ear. No other associated symptoms. Patient went to ENT for evaluation. Patient was treated empirically with prednisone, acyclovir and doxycycline. Her hearing loss has significantly improved.    REVIEW OF SYSTEMS: Full 14 system review of systems performed and negative with exception of: Headache dizziness. History of hypertension, hyperkalemia, coronary artery disease status post CABG. Migraine headaches and fibromyalgia.  ALLERGIES: Allergies  Allergen Reactions  . Anticoagulant Compound Itching and Other (See Comments)    Hands and feet tingle and itch. Hands and feet tingle and itch.  . Morphine And Related Nausea Only and Itching  . Bextra [Valdecoxib] Other (See Comments)    Increased lft's Increased lft's  . Hydrocod Polst-Cpm Polst Er Rash  . Lipitor [Atorvastatin] Other (See Comments)    Leg weakness Leg weakness  . Mevacor [Lovastatin] Other (See Comments)    Muscle weakness Muscle weakness  . Plavix  [Clopidogrel Bisulfate] Itching and Other (See Comments)    Hands and feet tingle and itch.  . Zocor [Simvastatin] Other (See Comments)    Bones hurt Bones hurt   . Doxycycline Diarrhea    N/v/d  . Metoprolol Other (See Comments)    Hair loss  . Plavix [Clopidogrel Bisulfate] Other (See Comments)    Itching of hands and feet  .  Codeine Itching and Rash    Hyper-active  . Hydrocod Polst-Cpm Polst Er Rash  . Lisinopril Cough    HOME MEDICATIONS: Outpatient Medications Prior to Visit  Medication Sig Dispense Refill  . ACCU-CHEK FASTCLIX LANCETS MISC   6  . ALPRAZolam (XANAX) 0.25 MG tablet Take 1 tablet (0.25 mg total) by mouth at bedtime as needed.  90 tablet 1  . aspirin 325 MG tablet Take 325 mg by mouth daily.    [name] atorvastatin (LIPITOR) 40 MG tablet TAKE 1 TABLET (40 MG TOTAL) BY MOUTH DAILY AT 6 PM. 90 tablet 2  . augmented betamethasone dipropionate (DIPROLENE-AF) 0.05 % cream Apply 1 application topically daily as needed. For hands  1  . ciclopirox (LOPROX) 0.77 % cream Apply topically as needed.    . clindamycin (CLEOCIN T) 1 % lotion Apply topically as needed.    . clobetasol cream (TEMOVATE) 1.61 % Apply 1 application topically 2 (two) times daily as needed.    . Coenzyme Q10 (CO Q 10) 100 MG CAPS Take by mouth.    . cycloSPORINE (RESTASIS) 0.05 % ophthalmic emulsion Place 1 drop into both eyes at bedtime.    . diclofenac sodium (VOLTAREN) 1 % GEL Apply topically 4 (four) times daily.    [name] estradiol (ESTRACE) 1 MG tablet Take 1 mg by mouth daily.    . hydrALAZINE (APRESOLINE) 10 MG tablet Take 1 tablet (10 mg total) by mouth 3 (three) times daily. 270 tablet 3  . KLOR-CON 10 10 MEQ tablet TAKE 2 TABLET BY MOUTH TWICE A DAY 120 tablet 3  . loratadine (CLARITIN) 10 MG tablet Take 10 mg by mouth daily.    [name] losartan-hydrochlorothiazide (HYZAAR) 100-25 MG tablet TAKE 1 TABLET BY MOUTH DAILY. 90 tablet 3  . Multiple Vitamin (MULTIVITAMIN) tablet Take 1 tablet by mouth daily.    . nitroGLYCERIN (NITROSTAT) 0.4 MG SL tablet Place 1 tablet (0.4 mg total) under the tongue every 5 (five) minutes as needed for chest pain. 25 tablet PRN  . Omega-3 Fatty Acids (FISH OIL) 1000 MG CAPS Take 2,000 mg by mouth 2 (two) times daily.    [name] omeprazole (PRILOSEC) 40 MG capsule TAKE 1 CAPSULE (40 MG TOTAL) BY MOUTH 2 (TWO) TIMES DAILY. 60 capsule 2  . PRENATAL VITAMINS PO Take 1 tablet by mouth at bedtime.     . torsemide (DEMADEX) 20 MG tablet Take 0.5 tablets (10 mg total) by mouth daily as needed (for fluid). 30 tablet 4  . tretinoin (RETIN-A) 0.1 % cream Apply 1 application topically every other day.    . Vitamin D, Ergocalciferol, 2000 units CAPS  Take by mouth.    [name] amLODipine (NORVASC) 5 MG tablet Take 1 tablet (5 mg total) by mouth daily. (Patient not taking: Reported on 08/10/2017) 90 tablet 3  . metroNIDAZOLE (FLAGYL) 500 MG tablet Take 1 tablet (500 mg total) by mouth 2 (two) times daily. (Patient not taking: Reported on 08/10/2017) 60 tablet 0   No facility-administered medications prior to visit.     PAST MEDICAL HISTORY: Past Medical History:  Diagnosis Date  . AAA (abdominal aortic aneurysm) ([place])    a. s/p stent grafting in 2009.  . Adenomatous colon polyp 05/2001  . Adenomatous duodenal polyp   . Anginal pain ([place])   . COPD (chronic obstructive pulmonary disease) ([place])   . Coronary artery disease    a. s/p CABG in 1995;  b. 05/2013 Neg MV, EF 82%;  c. 06/2013 Cath: LM nl,  LAD 40-50p, LCX nl, RCA 80p/175m, VG->RCA->PDA->PLSA min irregs, LIMA->LAD atretic, vigorous LV fxn.  . Diabetes mellitus without complication ([place])   . Diverticulosis   . Eczema, dyshidrotic   . Eczema, dyshidrotic 1986   Dr. Mathis Fare  . Fibromyalgia   . GERD (gastroesophageal reflux disease)   . Hyperlipidemia   . Hypertension   . Migraine   . Myocardial infarction ([place])   . Osteoarthritis   . Peripheral arterial disease ([facility])    left leg diagnosed by Dr Mare Ferrari  . Renal artery stenosis ([place])    a. 2008 s/p PTA    PAST SURGICAL HISTORY: Past Surgical History:  Procedure Laterality Date  . ABDOMINAL AORTAGRAM N/A 03/06/2014   Procedure: ABDOMINAL AORTAGRAM;  Surgeon: Rosetta Posner, MD;  Location: [facility] CATH LAB;  Service: Cardiovascular;  Laterality: N/A;  . ABDOMINAL AORTIC ANEURYSM REPAIR  2009   stenting  . ABDOMINAL HYSTERECTOMY     Total, 1992  . CARDIAC CATHETERIZATION    . CARDIOVASCULAR STRESS TEST  11/11/2009   normal study  . CARPAL TUNNEL RELEASE  08/1993   left wrist  . CARPAL TUNNEL RELEASE  01/1997   right  . CORONARY ARTERY BYPASS GRAFT  07/1994   Triple by Dr Ceasar Mons  . HAND SURGERY Right 09/2002   Right hand pulley  release  . LEFT HEART CATHETERIZATION WITH CORONARY/GRAFT ANGIOGRAM N/A 06/11/2013   Procedure: LEFT HEART CATHETERIZATION WITH [name];  Surgeon: Thayer Headings, MD;  Location: [facility] C[facility] LAB;  Service: Cardiovascular;  Laterality: N/A;  . MULTIPLE TOOTH EXTRACTIONS  2000   All upper teeth.  Has full dneture.   [name] RENAL ARTERY STENT  2008  . thyroid cyst apiration  05/1997 and 07/1998  . TONSILECTOMY, ADENOIDECTOMY, BILATERAL MYRINGOTOMY AND TUBES  1989  . TOTAL ABDOMINAL HYSTERECTOMY  1992    FAMILY HISTORY: Family History  Problem Relation Age of Onset  . Heart disease Mother   . Heart attack Mother   . Heart disease Father        before age 42  . Heart attack Father   . Heart disease Sister        before age 69  . Diabetes Sister   . Hyperlipidemia Sister   . Hypertension Sister   . Heart attack Sister   . Heart disease Brother   . Diabetes Brother   . Hyperlipidemia Brother   . Hypertension Brother   . Heart attack Brother   . AAA (abdominal aortic aneurysm) Brother   . Colon cancer Paternal Grandmother   . Liver cancer Sister   . Pancreatic cancer Neg Hx   . Rectal cancer Neg Hx   . Stomach cancer Neg Hx   . Esophageal cancer Neg Hx     SOCIAL HISTORY:  Social History   Social History  . Marital status: Married    Spouse name: N/A  . Number of children: N/A  . Years of education: N/A   Occupational History  . Not on file.   Social History Main Topics  . Smoking status: Former Smoker    Types: Cigarettes    Quit date: 12/07/1995  . Smokeless tobacco: Never Used  . Alcohol use 3.6 - 6.0 oz/week    6 - 10 Glasses of wine per week     Comment: 2-3 glasses daily  . Drug use: No  . Sexual activity: Not Currently   Other Topics Concern  . Not on file   Social History Narrative  Lives at home with husband.  Is retired.  Education 4 yrs of college.  No children.   Caffiene 2 cups daily.      PHYSICAL EXAM  GENERAL  EXAM/CONSTITUTIONAL: Vitals:  Vitals:   08/10/17 1141  BP: 139/81  Pulse: 84  Weight: 155 lb 12.8 oz (70.7 kg)  Height: 5' (1.524 m)     Body mass index is 30.43 kg/m.  No exam data present  Patient is in no distress; well developed, nourished and groomed; neck is supple  CARDIOVASCULAR:  Examination of carotid arteries is normal; no carotid bruits  Regular rate and rhythm, no murmurs  Examination of peripheral vascular system by observation and palpation is normal  EYES:  Ophthalmoscopic exam of optic discs and posterior segments is normal; no papilledema or hemorrhages  MUSCULOSKELETAL:  Gait, strength, tone, movements noted in Neurologic exam below  NEUROLOGIC: MENTAL STATUS:  No flowsheet data found.  awake, alert, oriented to person, place and time  recent and remote memory intact  normal attention and concentration  language fluent, comprehension intact, naming intact,   fund of knowledge appropriate  CRANIAL NERVE:   2nd - no papilledema on fundoscopic exam  2nd, 3rd, 4th, 6th - pupils equal and reactive to light, visual fields full to confrontation, extraocular muscles intact, no nystagmus  5th - facial sensation symmetric  7th - facial strength symmetric  8th - hearing intact  9th - palate elevates symmetrically, uvula midline  11th - shoulder shrug symmetric  12th - tongue protrusion midline  MOTOR:   normal bulk and tone, full strength in the BUE, BLE  SENSORY:   normal and symmetric to light touch, temperature, vibration  COORDINATION:   finger-nose-finger, fine finger movements normal  REFLEXES:   deep tendon reflexes TRACE and symmetric  GAIT/STATION:   narrow based gait; DIFF WITH TANDEM    DIAGNOSTIC DATA (LABS, IMAGING, TESTING) - I reviewed patient records, labs, notes, testing and imaging myself where available.  Lab Results  Component Value Date   WBC 7.0 06/29/2016   HGB 15.0 06/29/2016   HCT 44.3  06/29/2016   MCV 90.8 06/29/2016   PLT 192 06/29/2016      Component Value Date/Time   NA 141 06/23/2017 0742   K 4.2 06/23/2017 0742   CL 100 06/23/2017 0742   CO2 23 06/23/2017 0742   GLUCOSE 97 06/23/2017 0742   GLUCOSE 102 (H) 06/29/2016 1057   BUN 14 06/23/2017 0742   CREATININE 0.79 06/23/2017 0742   CREATININE 0.71 06/29/2016 1057   CALCIUM 10.2 06/23/2017 0742   PROT 6.2 06/23/2017 0742   ALBUMIN 4.3 06/23/2017 0742   AST 41 (H) 06/23/2017 0742   ALT 63 (H) 06/23/2017 0742   ALKPHOS 65 06/23/2017 0742   BILITOT 0.5 06/23/2017 0742   GFRNONAA 75 06/23/2017 0742   GFRAA 86 06/23/2017 0742   Lab Results  Component Value Date   CHOL 153 06/23/2017   HDL 64 06/23/2017   LDLCALC 55 06/23/2017   LDLDIRECT 87.4 12/09/2014   TRIG 172 (H) 06/23/2017   CHOLHDL 2.4 06/23/2017   Lab Results  Component Value Date   HGBA1C 5.9 (H) 06/23/2017   No results found for: VITAMINB12 Lab Results  Component Value Date   TSH 2.950 11/16/2014    07/03/17 MRI brain and IAC [I reviewed images myself and agree with interpretation. -VRP]  - Unremarkable cranial MRI. No retrocochlear lesion is identified. No abnormality is seen which might contribute to headache. - Mild cerebral  and cerebellar atrophy.  - Mild subcortical and periventricular T2 and FLAIR hyperintensities, likely chronic microvascular ischemic change. - Tiny focus chronic hemorrhage in the LEFT frontal subcortical white matter, likely sequelae of hypertension.    ASSESSMENT AND PLAN  71 y.o. year old female here with History of headaches since age 48 years old, likely migraine with aura. Now with recurrence of headaches in July 2018, one week after a tick bite and 1-2 months following significant oral and dental surgeries. Also with sudden onset hearing loss, possibly viral or inflammatory, which has improved.  Regarding patient's headaches these are likely migraine with aura. These may have been aggravated by the  above factors. Will proceed with migraine treatment.  Patient also having some generalized balance difficulty, which may be related to her hearing loss issue. Will set up physical therapy for gait and balance training exercises.  MRI of the brain was reviewed. She has some chronic changes related to hypertension but no acute findings.   Dx:  1. Migraine with aura and without status migrainosus, not intractable   2. Gait difficulty      PLAN:  MIGRAINE WITH AURA - start topiramate 25mg  at bedtime x 1 week; then increase to twice a day - use tylenol / ibuprofen as needed for breakthrough headache - consider migraine IV infusion if needed   Orders Placed This Encounter  Procedures  . Ambulatory referral to Physical Therapy   Meds ordered this encounter  Medications  . topiramate (TOPAMAX) 25 MG tablet    Sig: Take 1 tablet (25 mg total) by mouth 2 (two) times daily.    Dispense:  60 tablet    Refill:  6   Return in about 3 months (around 11/09/2017).    Penni Bombard, MD 12/11/1094, 04:54 PM Certified in Neurology, Neurophysiology and Neuroimaging  [facility] [address] [place], [place] [address] [phone]

## 2017-08-11 ENCOUNTER — Telehealth: Payer: Self-pay | Admitting: Diagnostic Neuroimaging

## 2017-08-11 NOTE — Telephone Encounter (Signed)
Pt called she is wanting to go to PT in [place]/preferably in our building. Please call

## 2017-08-12 NOTE — Telephone Encounter (Signed)
Called patient she is aware her order has been sent to [facility] for [facility] therapy.

## 2017-08-13 ENCOUNTER — Other Ambulatory Visit: Payer: Self-pay | Admitting: Cardiology

## 2017-08-22 ENCOUNTER — Ambulatory Visit
Admission: RE | Admit: 2017-08-22 | Discharge: 2017-08-22 | Disposition: A | Discharge status: Home / Self Care | Payer: Medicare Other | Source: Ambulatory Visit | Attending: Family Medicine | Admitting: Family Medicine

## 2017-08-22 DIAGNOSIS — Z1231 Encounter for screening mammogram for malignant neoplasm of breast: Secondary | ICD-10-CM

## 2017-08-23 ENCOUNTER — Other Ambulatory Visit: Payer: Self-pay | Admitting: Family Medicine

## 2017-08-23 DIAGNOSIS — R928 Other abnormal and inconclusive findings on diagnostic imaging of breast: Secondary | ICD-10-CM

## 2017-08-24 ENCOUNTER — Ambulatory Visit: Payer: Medicare Other | Attending: Diagnostic Neuroimaging | Admitting: Physical Therapy

## 2017-08-24 ENCOUNTER — Encounter: Payer: Self-pay | Admitting: Physical Therapy

## 2017-08-24 DIAGNOSIS — R2681 Unsteadiness on feet: Secondary | ICD-10-CM

## 2017-08-24 DIAGNOSIS — H8113 Benign paroxysmal vertigo, bilateral: Secondary | ICD-10-CM | POA: Diagnosis present

## 2017-08-24 DIAGNOSIS — R42 Dizziness and giddiness: Secondary | ICD-10-CM | POA: Insufficient documentation

## 2017-08-24 NOTE — Therapy (Signed)
[facility] [address] [place], [place], [address] Phone: [phone]   Fax:  [phone]  Physical Therapy Evaluation  Patient Details  Name: Megan [name] MRN: [id number] Date of Birth: August 22, 1946 Referring Provider: Penni Bombard, MD  Encounter Date: 08/24/2017      PT End of Session - 08/24/17 1255    Visit Number 1   Number of Visits 17   Date for PT Re-Evaluation 10/23/17   Authorization Type [place] visit PN and G code   PT Start Time 1150   PT Stop Time 1230   PT Time Calculation (min) 40 min   Activity Tolerance Patient tolerated treatment well   Behavior During Therapy [facility] for tasks assessed/performed      Past Medical History:  Diagnosis Date  . AAA (abdominal aortic aneurysm) ([place])    a. s/p stent grafting in 2009.  . Adenomatous colon polyp 05/2001  . Adenomatous duodenal polyp   . Anginal pain ([place])   . COPD (chronic obstructive pulmonary disease) ([place])   . Coronary artery disease    a. s/p CABG in 1995;  b. 05/2013 Neg MV, EF 82%;  c. 06/2013 Cath: LM nl, LAD 40-50p, LCX nl, RCA 80p/152m, VG->RCA->PDA->PLSA min irregs, LIMA->LAD atretic, vigorous LV fxn.  . Diabetes mellitus without complication ([place])   . Diverticulosis   . Eczema, dyshidrotic   . Eczema, dyshidrotic 1986   Dr. Mathis Fare  . Fibromyalgia   . GERD (gastroesophageal reflux disease)   . Hyperlipidemia   . Hypertension   . Migraine   . Myocardial infarction ([place])   . Osteoarthritis   . Peripheral arterial disease (HCC)    left leg diagnosed by Dr Mare Ferrari  . Renal artery stenosis ([place])    a. 2008 s/p PTA    Past Surgical History:  Procedure Laterality Date  . ABDOMINAL AORTAGRAM N/A 03/06/2014   Procedure: ABDOMINAL AORTAGRAM;  Surgeon: Rosetta Posner, MD;  Location: [facility] CATH LAB;  Service: Cardiovascular;  Laterality: N/A;  . ABDOMINAL AORTIC ANEURYSM REPAIR  2009   stenting  . ABDOMINAL  HYSTERECTOMY     Total, 1992  . CARDIAC CATHETERIZATION    . CARDIOVASCULAR STRESS TEST  11/11/2009   normal study  . CARPAL TUNNEL RELEASE  08/1993   left wrist  . CARPAL TUNNEL RELEASE  01/1997   right  . CORONARY ARTERY BYPASS GRAFT  07/1994   Triple by Dr Ceasar Mons  . HAND SURGERY Right 09/2002   Right hand pulley release  . LEFT HEART CATHETERIZATION WITH CORONARY/GRAFT ANGIOGRAM N/A 06/11/2013   Procedure: LEFT HEART CATHETERIZATION WITH [name];  Surgeon: Thayer Headings, MD;  Location: [facility] CATH LAB;  Service: Cardiovascular;  Laterality: N/A;  . MULTIPLE TOOTH EXTRACTIONS  2000   All upper teeth.  Has full dneture.   [name] RENAL ARTERY STENT  2008  . thyroid cyst apiration  05/1997 and 07/1998  . TONSILECTOMY, ADENOIDECTOMY, BILATERAL MYRINGOTOMY AND TUBES  1989  . TOTAL ABDOMINAL HYSTERECTOMY  1992    There were no vitals filed for this visit.       Subjective Assessment - 08/24/17 1155    Subjective May 15th had oral surgery/implants and began having headaches for multiple weeks after that.  Mid June pt experienced a tick bite and viral symptoms-chills, HA and loss of hearing in R ear-treated with steroids with improvement in hearing.  3 weeks ago HA started again with dizziness and imbalance.  Most  recent HA was yesterday and today with intermittent imbalance/dizziness.    Pertinent History AAA, angina, COPD, CAD s/p CABG, DM, diverticulosis, fibromyalgia, HTN, migraine, MI, OA, PAD, Renal artery stenosis. back pain   Limitations Walking   Diagnostic tests MRI-unremarkable   Patient Stated Goals To stop being dizzy when she stands up and bends over   Currently in Pain? Yes   Pain Location Head   Pain Orientation Left   Pain Descriptors / Indicators Headache   Pain Type Chronic pain   Pain Onset More than a month ago   Pain Frequency Intermittent            [facility] PT Assessment - 08/24/17 1203      Assessment   Medical Diagnosis Gait and balance  impairments   Referring Provider Penni Bombard, MD   Onset Date/Surgical Date 04/19/17   Prior Therapy PT for back     Precautions   Precautions Other (comment)   Precaution Comments AAA, angina, COPD, CAD s/p CABG, DM, diverticulosis, fibromyalgia, HTN, migraine, MI, OA, PAD, Renal artery stenosis. back pain     Balance Screen   Has the patient fallen in the past 6 months No   Has the patient had a decrease in activity level because of a fear of falling?  No   Is the patient reluctant to leave their home because of a fear of falling?  No     Home Environment   Living Environment Private residence   Living Arrangements Spouse/significant other   Type of [place] to enter   Entrance Stairs-Number of Steps 5   Entrance Stairs-Rails Right;Left   Home Layout Two level;Bed/bath upstairs   Alternate Level Stairs-Number of Steps 15   Alternate Level Stairs-Rails Left     Prior Function   Level of Independence Independent     Observation/Other Assessments   Focus on Therapeutic Outcomes (FOTO)  75 (25% limited; predicted 18% limitation)   Other Surveys  Other Surveys   Activities of Balance Confidence Scale (ABC Scale)  31.3%            Vestibular Assessment - 08/24/17 1207      Vestibular Assessment   General Observation With oral sugeries was on antibiotics.  After surgery pt reports continued numbness in lower lip and chin.  Denies nausea, vomiting, issues with cervical spine, changes in hearing or vision, or tinnitus.       Symptom Behavior   Type of Dizziness Imbalance   Frequency of Dizziness Intermittent   Duration of Dizziness notices it first thing in the am when she gets up   Aggravating Factors Mornings;Turning body quickly;Turning head quickly;Forward bending   Relieving Factors Comments  distraction with activity     Occulomotor Exam   Occulomotor Alignment Normal   Spontaneous Absent   Gaze-induced Absent   Smooth Pursuits  Intact   Saccades Slow  vertically   Comment convergence intact     Vestibulo-Occular Reflex   VOR to Slow Head Movement Normal   Comment HIT: negative bilaterally     Visual Acuity   Static 6   Dynamic 4     Other Tests   Comments gait speed: 9.25 seconds or 3.5 ft/sec     Positional Testing   Sidelying Test Sidelying Right;Sidelying Left     Sidelying Right   Sidelying Right Duration 20 seconds   Sidelying Right Symptoms Downbeat Nystagmus     Sidelying Left   Sidelying Left  Duration 20 seconds   Sidelying Left Symptoms Upbeat, left rotatory nystagmus        Objective measurements completed on examination: See above findings.                  PT Education - 08/24/17 1254    Education provided Yes   Education Details clinical findings, BPPV, PT POC and goals   Person(s) Educated Patient   Methods Explanation;Demonstration   Comprehension Verbalized understanding          PT Short Term Goals - 08/24/17 1307      PT SHORT TERM GOAL #1   Title Pt will participate in further balance testing: FGA and SOT if indicated    Time 4   Period Weeks   Status New   Target Date 09/23/17     PT SHORT TERM GOAL #2   Title Pt will report <2/10 dizziness with quick head turns and bending down to decrease risk for falls with functional activities   Time 4   Period Weeks   Status New   Target Date 09/23/17     PT SHORT TERM GOAL #3   Title Pt will improve gait velocity to > or = 3.8 ft/sec to decrease falls risk in community   Baseline 3.5 ft/sec   Time 4   Period Weeks   Status New   Target Date 09/23/17           PT Long Term Goals - 08/24/17 1312      PT LONG TERM GOAL #1   Title Pt will demonstrate independence with balance and vestibular HEP   Time 8   Period Weeks   Status New   Target Date 10/23/17     PT LONG TERM GOAL #2   Title Pt will report 0/10 vertigo with positional testing, head turns and bending down   Time 8   Period  Weeks   Status New   Target Date 10/23/17     PT LONG TERM GOAL #3   Title Pt will demonstrate decreased falls risk as indicated by increase in FGA score by 8 points   Baseline TBD   Time 8   Period Weeks   Status New   Target Date 10/23/17     PT LONG TERM GOAL #4   Title SOT goal if indicated   Baseline TBD   Status New   Target Date 10/23/17     PT LONG TERM GOAL #5   Title Pt will report increased confidence with balance as indicated by increase in ABC score by 15%   Baseline 31.3%   Time 8   Period Weeks   Status New   Target Date 10/23/17                Plan - 08/24/17 1256    Clinical Impression Statement Pt is a 71 year old female presenting to [facility] neuro for PT evaluation for balance and gait impairments.  Pt's PMH is significant for the following: AAA, angina, COPD, CAD s/p CABG, DM, diverticulosis, fibromyalgia, HTN, migraine, MI, OA, PAD, Renal artery stenosis and chronic back pain.  The following deficits were noted during the patient's exam: positional vertigo and upbeat, L rotary nystagmus of short duration during L sidelying test and downbeat nystagmus of short duration during R sidelying test indicating bilateral BPPV, headaches and impaired balance.  Pt's gait speed of 3.5 ft/sec indicates she is safe for community ambulation but is below normal walking speed.  Due to vertigo and impaired balance pt is at increased risk for falls with injury and benefit from skilled PT to address these impairments and functional limitations to maximize functional mobility independence and reduce falls risk.   History and Personal Factors relevant to plan of care: AAA, angina, COPD, CAD s/p CABG, DM, diverticulosis, fibromyalgia, HTN, migraine, MI, OA, PAD, Renal artery stenosis and chronic back pain   Clinical Presentation Evolving   Clinical Presentation due to: AAA, angina, COPD, CAD s/p CABG, DM, diverticulosis, fibromyalgia, HTN, migraine, MI, OA, PAD, Renal artery  stenosis and chronic back pain, positional vertigo, increased risk for falls   Clinical Decision Making Moderate   Rehab Potential Good   Clinical Impairments Affecting Rehab Potential extensive medical history, chronic HA   PT Frequency 2x / week   PT Duration 8 weeks   PT Treatment/Interventions ADLs/Self Care Home Management;Canalith Repostioning;Gait training;Stair training;Functional mobility training;Therapeutic activities;Therapeutic exercise;Balance training;Neuromuscular re-education;Patient/family education;Vestibular   PT Next Visit Plan SIGNIFICANT CARDIAC HISTORY-CHECK VITALS EACH VISIT.  FGA and revise LTG; treat L posterior canal BPPV.  SOT when appropriate   Consulted and Agree with Plan of Care Patient      Patient will benefit from skilled therapeutic intervention in order to improve the following deficits and impairments:  Decreased balance, Dizziness, Difficulty walking, Pain  Visit Diagnosis: BPPV (benign paroxysmal positional vertigo), bilateral  Dizziness and giddiness  Unsteadiness on feet      G-Codes - 09/16/2017 1316    Functional Assessment Tool Used (Outpatient Only) positive positional testing for bilat BPPV, ABC 31.3%   Functional Limitation Mobility: Walking and moving around   Mobility: Walking and Moving Around Current Status (Y6378) At least 20 percent but less than 40 percent impaired, limited or restricted   Mobility: Walking and Moving Around Goal Status (H8850) At least 1 percent but less than 20 percent impaired, limited or restricted       Problem List Patient Active Problem List   Diagnosis Date Noted  . Right lower quadrant abdominal pain 03/18/2017  . Chest pain at rest 11/16/2014  . DM (diabetes mellitus), type 2 with peripheral vascular complications ([place]) 27/74/1287  . Pain in the chest   . Chest pain 11/15/2014  . LLQ abdominal pain 11/05/2014  . Swelling of limb 03/29/2014  . Aftercare following surgery of the circulatory  system, [place] 03/29/2014  . Peripheral vascular disease, unspecified ([place]) 03/12/2014  . Aneurysm of abdominal vessel ([place]) 02/19/2014  . Claudication of lower extremity ([place]) 02/08/2014  . Type II or unspecified type diabetes mellitus without mention of complication, uncontrolled 02/08/2014  . Atherosclerosis of renal artery ([place]) 09/04/2013  . Coronary artery disease   . Unstable angina ([place]) 06/09/2013  . Peripheral vascular disease with claudication ([place])   . Abnormal LFTs (liver function tests) 09/19/2012  . Diverticulosis 09/19/2012  . Hypokalemia 03/23/2012  . Benign hypertensive heart disease without heart failure 05/18/2011  . CAD (coronary artery disease) 05/18/2011  . History of abdominal aortic aneurysm repair 05/18/2011  . History of renal artery stenosis 05/18/2011  . Hyperlipidemia     Raylene Everts, PT, DPT 09/16/17    5:00 PM    [facility] [address] [place], [place], [address] Phone: [phone]   Fax:  [phone]  Name: Megan [name] MRN: [id number] Date of Birth: 1946/01/04

## 2017-08-29 ENCOUNTER — Other Ambulatory Visit: Payer: Self-pay | Admitting: Vascular Surgery

## 2017-08-29 ENCOUNTER — Ambulatory Visit
Admission: RE | Admit: 2017-08-29 | Discharge: 2017-08-29 | Disposition: A | Discharge status: Home / Self Care | Payer: Medicare Other | Source: Ambulatory Visit | Attending: Family Medicine | Admitting: Family Medicine

## 2017-08-29 ENCOUNTER — Other Ambulatory Visit: Payer: Self-pay | Admitting: Family Medicine

## 2017-08-29 DIAGNOSIS — R928 Other abnormal and inconclusive findings on diagnostic imaging of breast: Secondary | ICD-10-CM

## 2017-08-29 DIAGNOSIS — N6489 Other specified disorders of breast: Secondary | ICD-10-CM

## 2017-08-30 ENCOUNTER — Ambulatory Visit: Payer: Medicare Other | Admitting: Diagnostic Neuroimaging

## 2017-08-30 ENCOUNTER — Ambulatory Visit
Admission: RE | Admit: 2017-08-30 | Discharge: 2017-08-30 | Disposition: A | Discharge status: Home / Self Care | Payer: Medicare Other | Source: Ambulatory Visit | Attending: Vascular Surgery | Admitting: Vascular Surgery

## 2017-08-30 ENCOUNTER — Other Ambulatory Visit: Payer: Self-pay | Admitting: Vascular Surgery

## 2017-08-30 ENCOUNTER — Other Ambulatory Visit: Payer: Self-pay | Admitting: Family Medicine

## 2017-08-30 ENCOUNTER — Other Ambulatory Visit: Payer: Self-pay | Admitting: Nurse Practitioner

## 2017-08-30 DIAGNOSIS — N6320 Unspecified lump in the left breast, unspecified quadrant: Secondary | ICD-10-CM

## 2017-08-30 DIAGNOSIS — N6489 Other specified disorders of breast: Secondary | ICD-10-CM

## 2017-08-31 ENCOUNTER — Other Ambulatory Visit: Payer: Self-pay | Admitting: Nurse Practitioner

## 2017-08-31 ENCOUNTER — Other Ambulatory Visit: Payer: Medicare Other

## 2017-08-31 DIAGNOSIS — N6320 Unspecified lump in the left breast, unspecified quadrant: Secondary | ICD-10-CM

## 2017-08-31 DIAGNOSIS — N6489 Other specified disorders of breast: Secondary | ICD-10-CM

## 2017-09-02 ENCOUNTER — Other Ambulatory Visit: Payer: Medicare Other

## 2017-09-05 ENCOUNTER — Ambulatory Visit: Payer: Medicare Other | Attending: Diagnostic Neuroimaging | Admitting: Physical Therapy

## 2017-09-05 ENCOUNTER — Encounter: Payer: Self-pay | Admitting: Physical Therapy

## 2017-09-05 DIAGNOSIS — H8113 Benign paroxysmal vertigo, bilateral: Secondary | ICD-10-CM | POA: Diagnosis present

## 2017-09-05 DIAGNOSIS — R42 Dizziness and giddiness: Secondary | ICD-10-CM | POA: Diagnosis present

## 2017-09-05 DIAGNOSIS — R2681 Unsteadiness on feet: Secondary | ICD-10-CM | POA: Diagnosis present

## 2017-09-05 NOTE — Patient Instructions (Signed)
Habituation - Tip Card  1.The goal of habituation training is to assist in decreasing symptoms of vertigo, dizziness, or nausea provoked by specific head and body motions. 2.These exercises may initially increase symptoms; however, be persistent and work through symptoms. With repetition and time, the exercises will assist in reducing or eliminating symptoms. 3.Exercises should be stopped and discussed with the therapist if you experience any of the following: - Sudden change or fluctuation in hearing - New onset of ringing in the ears, or increase in current intensity - Any fluid discharge from the ear - Severe pain in neck or back - Extreme nausea  Habituation - Sit to Side-Lying   Sit on edge of bed. Lie down onto the right side and hold until dizziness stops, plus 20 seconds.  Return to sitting and wait until dizziness stops, plus 20 seconds.  Repeat to the left side. Repeat sequence 5 times per session. Do 2 sessions per day.      

## 2017-09-05 NOTE — Therapy (Signed)
[facility] [address] [place], [place], [address] Phone: [phone]   Fax:  [phone]  Physical Therapy Treatment  Patient Details  Name: Megan [name] MRN: [id number] Date of Birth: 04/07/1946 Referring Provider: Penni Bombard, MD  Encounter Date: 09/05/2017      PT End of Session - 09/05/17 4270    Visit Number 2   Number of Visits 17   Date for PT Re-Evaluation 10/23/17   Authorization Type [place] visit PN and G code   PT Start Time 1530   PT Stop Time 1615   PT Time Calculation (min) 45 min   Activity Tolerance Patient tolerated treatment well   Behavior During Therapy [facility] for tasks assessed/performed      Past Medical History:  Diagnosis Date  . AAA (abdominal aortic aneurysm) ([place])    a. s/p stent grafting in 2009.  . Adenomatous colon polyp 05/2001  . Adenomatous duodenal polyp   . Anginal pain ([place])   . COPD (chronic obstructive pulmonary disease) ([place])   . Coronary artery disease    a. s/p CABG in 1995;  b. 05/2013 Neg MV, EF 82%;  c. 06/2013 Cath: LM nl, LAD 40-50p, LCX nl, RCA 80p/11m, VG->RCA->PDA->PLSA min irregs, LIMA->LAD atretic, vigorous LV fxn.  . Diabetes mellitus without complication ([place])   . Diverticulosis   . Eczema, dyshidrotic   . Eczema, dyshidrotic 1986   Dr. Mathis Fare  . Fibromyalgia   . GERD (gastroesophageal reflux disease)   . Hyperlipidemia   . Hypertension   . Migraine   . Myocardial infarction ([place])   . Osteoarthritis   . Peripheral arterial disease (HCC)    left leg diagnosed by Dr Mare Ferrari  . Renal artery stenosis ([place])    a. 2008 s/p PTA    Past Surgical History:  Procedure Laterality Date  . ABDOMINAL AORTAGRAM N/A 03/06/2014   Procedure: ABDOMINAL AORTAGRAM;  Surgeon: Rosetta Posner, MD;  Location: [facility] CATH LAB;  Service: Cardiovascular;  Laterality: N/A;  . ABDOMINAL AORTIC ANEURYSM REPAIR  2009   stenting  . ABDOMINAL  HYSTERECTOMY     Total, 1992  . CARDIAC CATHETERIZATION    . CARDIOVASCULAR STRESS TEST  11/11/2009   normal study  . CARPAL TUNNEL RELEASE  08/1993   left wrist  . CARPAL TUNNEL RELEASE  01/1997   right  . CORONARY ARTERY BYPASS GRAFT  07/1994   Triple by Dr Ceasar Mons  . HAND SURGERY Right 09/2002   Right hand pulley release  . LEFT HEART CATHETERIZATION WITH CORONARY/GRAFT ANGIOGRAM N/A 06/11/2013   Procedure: LEFT HEART CATHETERIZATION WITH [name];  Surgeon: Thayer Headings, MD;  Location: [facility] CATH LAB;  Service: Cardiovascular;  Laterality: N/A;  . MULTIPLE TOOTH EXTRACTIONS  2000   All upper teeth.  Has full dneture.   [name] RENAL ARTERY STENT  2008  . thyroid cyst apiration  05/1997 and 07/1998  . TONSILECTOMY, ADENOIDECTOMY, BILATERAL MYRINGOTOMY AND TUBES  1989  . TOTAL ABDOMINAL HYSTERECTOMY  1992    There were no vitals filed for this visit.      Subjective Assessment - 09/05/17 1533    Subjective Pt reports dizziness is a little better, no headaches for the past 1.5 weeks.  No falls.   Pertinent History AAA, angina, COPD, CAD s/p CABG, DM, diverticulosis, fibromyalgia, HTN, migraine, MI, OA, PAD, Renal artery stenosis. back pain   Limitations Walking   Diagnostic tests MRI-unremarkable   Patient Stated  Goals To stop being dizzy when she stands up and bends over   Currently in Pain? No/denies            Vestibular Assessment - 09/05/17 1535      Positional Testing   Dix-Hallpike Dix-Hallpike Right;Dix-Hallpike Left   Horizontal Canal Testing Horizontal Canal Right;Horizontal Canal Left     Dix-Hallpike Right   Dix-Hallpike Right Duration >1 minute   Dix-Hallpike Right Symptoms Downbeat, right rotatory nystagmus     Dix-Hallpike Left   Dix-Hallpike Left Duration 0   Dix-Hallpike Left Symptoms No nystagmus     Horizontal Canal Right   Horizontal Canal Right Duration 0   Horizontal Canal Right Symptoms Normal     Horizontal Canal Left    Horizontal Canal Left Duration 0   Horizontal Canal Left Symptoms Normal     Orthostatics   BP sitting 120/74   HR sitting 72                  Vestibular Treatment/Exercise - 09/05/17 1549      Vestibular Treatment/Exercise   Vestibular Treatment Provided Canalith Repositioning   Canalith Repositioning [place] Procedure Right Anterior   Habituation Exercises Nestor Lewandowsky     [place] Procedure Right Anterior   Number of Reps  2   Overall Response  No change     Nestor Lewandowsky   Number of Reps  1   Symptom Description  demonstrated sequence to pt               PT Education - 09/05/17 1620    Education provided Yes   Education Details habituation HEP   Person(s) Educated Patient   Methods Explanation;Demonstration;Handout   Comprehension Verbalized understanding          PT Short Term Goals - 08/24/17 1307      PT SHORT TERM GOAL #1   Title Pt will participate in further balance testing: FGA and SOT if indicated    Time 4   Period Weeks   Status New   Target Date 09/23/17     PT SHORT TERM GOAL #2   Title Pt will report <2/10 dizziness with quick head turns and bending down to decrease risk for falls with functional activities   Time 4   Period Weeks   Status New   Target Date 09/23/17     PT SHORT TERM GOAL #3   Title Pt will improve gait velocity to > or = 3.8 ft/sec to decrease falls risk in community   Baseline 3.5 ft/sec   Time 4   Period Weeks   Status New   Target Date 09/23/17           PT Long Term Goals - 08/24/17 1312      PT LONG TERM GOAL #1   Title Pt will demonstrate independence with balance and vestibular HEP   Time 8   Period Weeks   Status New   Target Date 10/23/17     PT LONG TERM GOAL #2   Title Pt will report 0/10 vertigo with positional testing, head turns and bending down   Time 8   Period Weeks   Status New   Target Date 10/23/17     PT LONG TERM GOAL #3   Title Pt will demonstrate decreased falls  risk as indicated by increase in FGA score by 8 points   Baseline TBD   Time 8   Period Weeks   Status New   Target  Date 10/23/17     PT LONG TERM GOAL #4   Title SOT goal if indicated   Baseline TBD   Status New   Target Date 10/23/17     PT LONG TERM GOAL #5   Title Pt will report increased confidence with balance as indicated by increase in ABC score by 15%   Baseline 31.3%   Time 8   Period Weeks   Status New   Target Date 10/23/17               Plan - 09/05/17 1621    Clinical Impression Statement Treatment session today focused on reassessment of peripheral vestibular canals with L posterior canal clear today-no nystagmus and no vertigo but pt continued to present with persistent, low amplitude downbeat, R rotary nystagmus indicating R anterior canal cupulolithiasis.  Pt treated x 2 with R anterior liberatory manuever with no change in symptoms. Provided pt with Brandt-Daroff for habituation but advised to wait until Wednesday to initiate.  Will continue to assess and treat at next session.   Rehab Potential Good   Clinical Impairments Affecting Rehab Potential extensive medical history, chronic HA   PT Frequency 2x / week   PT Duration 8 weeks   PT Treatment/Interventions ADLs/Self Care Home Management;Canalith Repostioning;Gait training;Stair training;Functional mobility training;Therapeutic activities;Therapeutic exercise;Balance training;Neuromuscular re-education;Patient/family education;Vestibular   PT Next Visit Plan SIGNIFICANT CARDIAC HISTORY-CHECK VITALS EACH VISIT.  FGA and revise LTG; check brandt-daroff technique, treat R anterior canal cupulolithiasis as needed.  SOT when appropriate   Consulted and Agree with Plan of Care Patient      Patient will benefit from skilled therapeutic intervention in order to improve the following deficits and impairments:  Decreased balance, Dizziness, Difficulty walking, Pain  Visit Diagnosis: BPPV (benign paroxysmal  positional vertigo), bilateral  Dizziness and giddiness  Unsteadiness on feet     Problem List Patient Active Problem List   Diagnosis Date Noted  . Right lower quadrant abdominal pain 03/18/2017  . Chest pain at rest 11/16/2014  . DM (diabetes mellitus), type 2 with peripheral vascular complications ([place]) 12/08/7251  . Pain in the chest   . Chest pain 11/15/2014  . LLQ abdominal pain 11/05/2014  . Swelling of limb 03/29/2014  . Aftercare following surgery of the circulatory system, [place] 03/29/2014  . Peripheral vascular disease, unspecified ([place]) 03/12/2014  . Aneurysm of abdominal vessel ([place]) 02/19/2014  . Claudication of lower extremity ([place]) 02/08/2014  . Type II or unspecified type diabetes mellitus without mention of complication, uncontrolled 02/08/2014  . Atherosclerosis of renal artery ([place]) 09/04/2013  . Coronary artery disease   . Unstable angina ([place]) 06/09/2013  . Peripheral vascular disease with claudication ([place])   . Abnormal LFTs (liver function tests) 09/19/2012  . Diverticulosis 09/19/2012  . Hypokalemia 03/23/2012  . Benign hypertensive heart disease without heart failure 05/18/2011  . CAD (coronary artery disease) 05/18/2011  . History of abdominal aortic aneurysm repair 05/18/2011  . History of renal artery stenosis 05/18/2011  . Hyperlipidemia     Raylene Everts, PT, DPT 09/05/17    4:29 PM    [facility] [address] [place], [place], [address] Phone: [phone]   Fax:  [phone]  Name: Megan [name] MRN: [id number] Date of Birth: 1946/07/30

## 2017-09-08 ENCOUNTER — Encounter: Payer: Self-pay | Admitting: Physical Therapy

## 2017-09-08 ENCOUNTER — Ambulatory Visit: Payer: Medicare Other | Admitting: Physical Therapy

## 2017-09-08 DIAGNOSIS — R2681 Unsteadiness on feet: Secondary | ICD-10-CM

## 2017-09-08 DIAGNOSIS — R42 Dizziness and giddiness: Secondary | ICD-10-CM

## 2017-09-08 DIAGNOSIS — H8113 Benign paroxysmal vertigo, bilateral: Secondary | ICD-10-CM | POA: Diagnosis not present

## 2017-09-08 NOTE — Therapy (Signed)
[facility] [address] [place], [place], [address] Phone: [phone]   Fax:  [phone]  Physical Therapy Treatment  Patient Details  Name: Megan [name] MRN: [id number] Date of Birth: Feb 04, 1946 Referring Provider: Penni Bombard, MD  Encounter Date: 09/08/2017      PT End of Session - 09/08/17 1149    Visit Number 3   Number of Visits 17   Date for PT Re-Evaluation 10/23/17   Authorization Type [facility] visit PN and G code   PT Start Time 1102   PT Stop Time 1145   PT Time Calculation (min) 43 min   Activity Tolerance Patient tolerated treatment well   Behavior During Therapy [facility] for tasks assessed/performed      Past Medical History:  Diagnosis Date  . AAA (abdominal aortic aneurysm) ([place])    a. s/p stent grafting in 2009.  . Adenomatous colon polyp 05/2001  . Adenomatous duodenal polyp   . Anginal pain ([place])   . COPD (chronic obstructive pulmonary disease) ([place])   . Coronary artery disease    a. s/p CABG in 1995;  b. 05/2013 Neg MV, EF 82%;  c. 06/2013 Cath: LM nl, LAD 40-50p, LCX nl, RCA 80p/177m, VG->RCA->PDA->PLSA min irregs, LIMA->LAD atretic, vigorous LV fxn.  . Diabetes mellitus without complication ([place])   . Diverticulosis   . Eczema, dyshidrotic   . Eczema, dyshidrotic 1986   Dr. Mathis Fare  . Fibromyalgia   . GERD (gastroesophageal reflux disease)   . Hyperlipidemia   . Hypertension   . Migraine   . Myocardial infarction ([place])   . Osteoarthritis   . Peripheral arterial disease (HCC)    left leg diagnosed by Dr Mare Ferrari  . Renal artery stenosis ([place])    a. 2008 s/p PTA    Past Surgical History:  Procedure Laterality Date  . ABDOMINAL AORTAGRAM N/A 03/06/2014   Procedure: ABDOMINAL AORTAGRAM;  Surgeon: Rosetta Posner, MD;  Location[facility] CATH LAB;  Service: Cardiovascular;  Laterality: N/A;  . ABDOMINAL AORTIC ANEURYSM REPAIR  2009   stenting  . ABDOMINAL  HYSTERECTOMY     Total, 1992  . CARDIAC CATHETERIZATION    . CARDIOVASCULAR STRESS TEST  11/11/2009   normal study  . CARPAL TUNNEL RELEASE  08/1993   left wrist  . CARPAL TUNNEL RELEASE  01/1997   right  . CORONARY ARTERY BYPASS GRAFT  07/1994   Triple by Dr Ceasar Mons  . HAND SURGERY Right 09/2002   Right hand pulley release  . LEFT HEART CATHETERIZATION WITH CORONARY/GRAFT ANGIOGRAM N/A 06/11/2013   Procedure: LEFT HEART CATHETERIZATION WITH [name];  Surgeon: Thayer Headings, MD;  Location: [facility] CATH LAB;  Service: Cardiovascular;  Laterality: N/A;  . MULTIPLE TOOTH EXTRACTIONS  2000   All upper teeth.  Has full dneture.   [name] RENAL ARTERY STENT  2008  . thyroid cyst apiration  05/1997 and 07/1998  . TONSILECTOMY, ADENOIDECTOMY, BILATERAL MYRINGOTOMY AND TUBES  1989  . TOTAL ABDOMINAL HYSTERECTOMY  1992    There were no vitals filed for this visit.      Subjective Assessment - 09/08/17 1106    Subjective Pt attempted Nestor Lewandowsky on couch but thinks she should try it on the bed.  Dizziness continues to be better overall.  Would like to work on leg strength.   Pertinent History AAA, angina, COPD, CAD s/p CABG, DM, diverticulosis, fibromyalgia, HTN, migraine, MI, OA, PAD, Renal artery stenosis. back pain  Limitations Walking   Diagnostic tests MRI-unremarkable   Patient Stated Goals To stop being dizzy when she stands up and bends over   Currently in Pain? No/denies            [facility] PT Assessment - 09/08/17 1108      Transfers   Transfers Floor to Transfer   Floor to Transfer 5: Supervision   Floor to Transfer Details (indicate cue type and reason) Pt able to lower self without UE support but requires significant UE support on floor or mat to transition to half kneeling > stand due to lateral LOB     Standardized Balance Assessment   Standardized Balance Assessment Five Times Sit to Stand   Five times sit to stand comments  9.82 [facility]     Functional Gait   Assessment   Gait assessed  Yes   Gait Level Surface Walks 20 ft in less than 5.5 sec, no assistive devices, good speed, no evidence for imbalance, normal gait pattern, deviates no more than 6 in outside of the 12 in walkway width.   Change in Gait Speed Able to smoothly change walking speed without loss of balance or gait deviation. Deviate no more than 6 in outside of the 12 in walkway width.   Gait with Horizontal Head Turns Performs head turns smoothly with no change in gait. Deviates no more than 6 in outside 12 in walkway width   Gait with Vertical Head Turns Performs head turns with no change in gait. Deviates no more than 6 in outside 12 in walkway width.   Gait and Pivot Turn Pivot turns safely within 3 sec and stops quickly with no loss of balance.   Step Over Obstacle Is able to step over 2 stacked shoe boxes taped together (9 in total height) without changing gait speed. No evidence of imbalance.   Gait with Narrow Base of Support Ambulates 7-9 steps.   Gait with Eyes Closed Walks 20 ft, slow speed, abnormal gait pattern, evidence for imbalance, deviates 10-15 in outside 12 in walkway width. Requires more than 9 sec to ambulate 20 ft.   Ambulating Backwards Walks 20 ft, uses assistive device, slower speed, mild gait deviations, deviates 6-10 in outside 12 in walkway width.   Steps Alternating feet, must use rail.   Total Score 25   FGA comment: 25/30                     [facility] Adult PT Treatment/Exercise - 09/08/17 1108      Exercises   Exercises Knee/Hip     Knee/Hip Exercises: Standing   Other Standing Knee Exercises Tall kneeling on mat performing repetitive transition tall kneeling <> half kneeling x 10 each side with increased difficulty bringing RLE forwards, stabilizing on LLE                PT Education - 09/08/17 1149    Education provided Yes   Education Details Kneeling exercise added to HEP   Person(s) Educated Patient   Methods  Explanation;Demonstration;Handout   Comprehension Verbalized understanding;Returned demonstration          PT Short Term Goals - 09/08/17 1157      PT SHORT TERM GOAL #1   Title Pt will participate in further balance testing: FGA    Time 4   Period Weeks   Status Achieved     PT SHORT TERM GOAL #2   Title Pt will report <2/10 dizziness with quick head  turns and bending down to decrease risk for falls with functional activities   Time 4   Period Weeks   Status On-going   Target Date 09/23/17     PT SHORT TERM GOAL #3   Title Pt will improve gait velocity to > or = 3.8 ft/sec to decrease falls risk in community   Baseline 3.5 ft/sec   Time 4   Period Weeks   Status On-going   Target Date 09/23/17           PT Long Term Goals - 09/08/17 1157      PT LONG TERM GOAL #1   Title Pt will demonstrate independence with balance and vestibular HEP   Time 8   Period Weeks   Status On-going   Target Date 10/23/17     PT LONG TERM GOAL #2   Title Pt will report 0/10 vertigo with positional testing, head turns and bending down   Time 8   Period Weeks   Status On-going   Target Date 10/23/17     PT LONG TERM GOAL #3   Title Pt will demonstrate decreased falls risk as indicated by increase in FGA score by 4 points   Baseline 25/30   Time 8   Period Weeks   Status Revised   Target Date 10/23/17     PT LONG TERM GOAL #5   Title Pt will report increased confidence with balance as indicated by increase in ABC score by 15%   Baseline 31.3%   Time 8   Period Weeks   Status On-going   Target Date 10/23/17               Plan - 09/08/17 1150    Clinical Impression Statement Due to dizziness improving treatment session today focused on assessment of LE strength during functional tasks (sit <> stand and floor transfers) and assessment of falls risk and balance with higher level gait tasks with FGA.  Pt does demonstrate increased imbalance when ambulating with more  narrow BOS and with decreased visual input.  Rest of session spent assessing pt's specific limitations with floor <> stand tranfers and initiating balance and LE strength HEP; will continue to address.     Rehab Potential Good   Clinical Impairments Affecting Rehab Potential extensive medical history, chronic HA   PT Frequency 2x / week   PT Duration 8 weeks   PT Treatment/Interventions ADLs/Self Care Home Management;Canalith Repostioning;Gait training;Stair training;Functional mobility training;Therapeutic activities;Therapeutic exercise;Balance training;Neuromuscular re-education;Patient/family education;Vestibular   PT Next Visit Plan SIGNIFICANT CARDIAC HISTORY-CHECK VITALS EACH VISIT.  reassess canals and treat R ant cupulo as needed.  continue to work on each part of floor <> stand and add higher level LE strength and balance to HEP   Consulted and Agree with Plan of Care Patient      Patient will benefit from skilled therapeutic intervention in order to improve the following deficits and impairments:  Decreased balance, Dizziness, Difficulty walking, Pain  Visit Diagnosis: Dizziness and giddiness  Unsteadiness on feet     Problem List Patient Active Problem List   Diagnosis Date Noted  . Right lower quadrant abdominal pain 03/18/2017  . Chest pain at rest 11/16/2014  . DM (diabetes mellitus), type 2 with peripheral vascular complications ([place]) 69/62/9528  . Pain in the chest   . Chest pain 11/15/2014  . LLQ abdominal pain 11/05/2014  . Swelling of limb 03/29/2014  . Aftercare following surgery of the circulatory system, [place] 03/29/2014  .  Peripheral vascular disease, unspecified ([place]) 03/12/2014  . Aneurysm of abdominal vessel ([place]) 02/19/2014  . Claudication of lower extremity ([place]) 02/08/2014  . Type II or unspecified type diabetes mellitus without mention of complication, uncontrolled 02/08/2014  . Atherosclerosis of renal artery ([place]) 09/04/2013  . Coronary artery  disease   . Unstable angina ([place]) 06/09/2013  . Peripheral vascular disease with claudication ([place])   . Abnormal LFTs (liver function tests) 09/19/2012  . Diverticulosis 09/19/2012  . Hypokalemia 03/23/2012  . Benign hypertensive heart disease without heart failure 05/18/2011  . CAD (coronary artery disease) 05/18/2011  . History of abdominal aortic aneurysm repair 05/18/2011  . History of renal artery stenosis 05/18/2011  . Hyperlipidemia     Raylene Everts, PT, DPT 09/08/17    11:59 AM    [facility] [address] [place], [place], [address] Phone: [phone]   Fax:  [phone]  Name: HETAL PROANO MRN: [id number] Date of Birth: 06/28/1946

## 2017-09-08 NOTE — Patient Instructions (Signed)
Forward Lean (Half-Kneeling)    Start with Kneeling on both legs, one hand on couch beside you.  Keeping your balance, step left leg forwards, slowly lean forward over left leg, keeping stomach tight.  Return left leg under you and repeat with right leg.   Repeat _10___ times per side. Do __1__ sets per session. Do _2___ sessions per day.

## 2017-09-12 ENCOUNTER — Encounter: Payer: Medicare Other | Admitting: Physical Therapy

## 2017-09-13 ENCOUNTER — Ambulatory Visit: Payer: Medicare Other | Admitting: Physical Therapy

## 2017-09-13 ENCOUNTER — Encounter: Payer: Medicare Other | Admitting: Physical Therapy

## 2017-09-13 ENCOUNTER — Encounter: Payer: Self-pay | Admitting: Physical Therapy

## 2017-09-13 DIAGNOSIS — H8113 Benign paroxysmal vertigo, bilateral: Secondary | ICD-10-CM

## 2017-09-13 DIAGNOSIS — R42 Dizziness and giddiness: Secondary | ICD-10-CM

## 2017-09-13 DIAGNOSIS — R2681 Unsteadiness on feet: Secondary | ICD-10-CM

## 2017-09-13 NOTE — Therapy (Signed)
[facility] [address] [place], [place], [address] Phone: [phone]   Fax:  [phone]  Physical Therapy Treatment  Patient Details  Name: Megan [name] MRN: [id number] Date of Birth: Feb 22, 1946 Referring Provider: Penni Bombard, MD  Encounter Date: 09/13/2017      PT End of Session - 09/13/17 1906    Visit Number 4   Number of Visits 17   Date for PT Re-Evaluation 10/23/17   Authorization Type [place] visit PN and G code   PT Start Time 9381   PT Stop Time 1400   PT Time Calculation (min) 43 min   Activity Tolerance Patient tolerated treatment well   Behavior During Therapy [facility] for tasks assessed/performed      Past Medical History:  Diagnosis Date  . AAA (abdominal aortic aneurysm) ([place])    a. s/p stent grafting in 2009.  . Adenomatous colon polyp 05/2001  . Adenomatous duodenal polyp   . Anginal pain ([place])   . COPD (chronic obstructive pulmonary disease) ([place])   . Coronary artery disease    a. s/p CABG in 1995;  b. 05/2013 Neg MV, EF 82%;  c. 06/2013 Cath: LM nl, LAD 40-50p, LCX nl, RCA 80p/165m, VG->RCA->PDA->PLSA min irregs, LIMA->LAD atretic, vigorous LV fxn.  . Diabetes mellitus without complication ([place])   . Diverticulosis   . Eczema, dyshidrotic   . Eczema, dyshidrotic 1986   Dr. Mathis Fare  . Fibromyalgia   . GERD (gastroesophageal reflux disease)   . Hyperlipidemia   . Hypertension   . Migraine   . Myocardial infarction ([place])   . Osteoarthritis   . Peripheral arterial disease (HCC)    left leg diagnosed by Dr Mare Ferrari  . Renal artery stenosis ([place])    a. 2008 s/p PTA    Past Surgical History:  Procedure Laterality Date  . ABDOMINAL AORTAGRAM N/A 03/06/2014   Procedure: ABDOMINAL AORTAGRAM;  Surgeon: Rosetta Posner, MD;  Location: [facility] CATH LAB;  Service: Cardiovascular;  Laterality: N/A;  . ABDOMINAL AORTIC ANEURYSM REPAIR  2009   stenting  . ABDOMINAL  HYSTERECTOMY     Total, 1992  . CARDIAC CATHETERIZATION    . CARDIOVASCULAR STRESS TEST  11/11/2009   normal study  . CARPAL TUNNEL RELEASE  08/1993   left wrist  . CARPAL TUNNEL RELEASE  01/1997   right  . CORONARY ARTERY BYPASS GRAFT  07/1994   Triple by Dr Ceasar Mons  . HAND SURGERY Right 09/2002   Right hand pulley release  . LEFT HEART CATHETERIZATION WITH CORONARY/GRAFT ANGIOGRAM N/A 06/11/2013   Procedure: LEFT HEART CATHETERIZATION WITH [name];  Surgeon: Thayer Headings, MD;  Location: [facility] CATH LAB;  Service: Cardiovascular;  Laterality: N/A;  . MULTIPLE TOOTH EXTRACTIONS  2000   All upper teeth.  Has full dneture.   [name] RENAL ARTERY STENT  2008  . thyroid cyst apiration  05/1997 and 07/1998  . TONSILECTOMY, ADENOIDECTOMY, BILATERAL MYRINGOTOMY AND TUBES  1989  . TOTAL ABDOMINAL HYSTERECTOMY  1992    There were no vitals filed for this visit.      Subjective Assessment - 09/13/17 1320    Subjective Pt reports dizziness is still present with bending down and turning quickly.  Went to the gym yesterday and worked on [employer].  Continues to work on floor > stand transfer.   Pertinent History AAA, angina, COPD, CAD s/p CABG, DM, diverticulosis, fibromyalgia, HTN, migraine, MI, OA, PAD, Renal artery  stenosis. back pain   Limitations Walking   Diagnostic tests MRI-unremarkable   Patient Stated Goals To stop being dizzy when she stands up and bends over   Currently in Pain? No/denies                Vestibular Assessment - 09/13/17 1322      Positional Testing   Dix-Hallpike Dix-Hallpike Right     Dix-Hallpike Right   Dix-Hallpike Right Duration >1 minute   Dix-Hallpike Right Symptoms Downbeat, right rotatory nystagmus  x 2 assessments pre and post treatment                  Vestibular Treatment/Exercise - 09/13/17 1331      Vestibular Treatment/Exercise   Vestibular Treatment Provided Canalith Repositioning   Canalith  Repositioning Semont Procedure Right Anterior;Epley Manuever Right      EPLEY MANUEVER RIGHT   Number of Reps  1   Overall Response Improved Symptoms   Response Details  for anterior canal     [place] Procedure Right Anterior   Number of Reps  2   Overall Response  Improved Symptoms   Response Details  second treatment had pt begin with 10 reps each head turns and head nods            Balance Exercises - 09/13/17 1359      Balance Exercises: Standing   Standing Eyes Opened Narrow base of support (BOS);Head turns;Solid surface;Other reps (comment)  10 reps head nods/turns   Standing Eyes Closed Narrow base of support (BOS);Head turns;Solid surface;Other reps (comment)  10 reps head turns/nods           PT Education - 09/13/17 1905    Education provided Yes   Education Details corner balance added to HEP   Person(s) Educated Patient   Methods Explanation;Demonstration;Handout   Comprehension Verbalized understanding;Returned demonstration          PT Short Term Goals - 09/08/17 1157      PT SHORT TERM GOAL #1   Title Pt will participate in further balance testing: FGA    Time 4   Period Weeks   Status Achieved     PT SHORT TERM GOAL #2   Title Pt will report <2/10 dizziness with quick head turns and bending down to decrease risk for falls with functional activities   Time 4   Period Weeks   Status On-going   Target Date 09/23/17     PT SHORT TERM GOAL #3   Title Pt will improve gait velocity to > or = 3.8 ft/sec to decrease falls risk in community   Baseline 3.5 ft/sec   Time 4   Period Weeks   Status On-going   Target Date 09/23/17           PT Long Term Goals - 09/08/17 1157      PT LONG TERM GOAL #1   Title Pt will demonstrate independence with balance and vestibular HEP   Time 8   Period Weeks   Status On-going   Target Date 10/23/17     PT LONG TERM GOAL #2   Title Pt will report 0/10 vertigo with positional testing, head turns and  bending down   Time 8   Period Weeks   Status On-going   Target Date 10/23/17     PT LONG TERM GOAL #3   Title Pt will demonstrate decreased falls risk as indicated by increase in FGA score by 4 points   Baseline  25/30   Time 8   Period Weeks   Status Revised   Target Date 10/23/17     PT LONG TERM GOAL #5   Title Pt will report increased confidence with balance as indicated by increase in ABC score by 15%   Baseline 31.3%   Time 8   Period Weeks   Status On-going   Target Date 10/23/17               Plan - 09/13/17 1906    Clinical Impression Statement Due to ongoing dizziness despite two weeks of habituation training at home performed re-assessment of R peripheral canals with continued persistent, low amplitude downbeat, R rotary nystagmus which continues to indicate R anterior canal cupulolithiasis.  Performed 2 reps R anterior liberatory manuever and one R anterior CRM with improvement in symptoms overall.  Continued to focus on balance and habituation to head turns with addition of corner balance exercise to HEP.  Will continue to address as well as LE weakness to make progress towards goals.   Rehab Potential Good   Clinical Impairments Affecting Rehab Potential extensive medical history, chronic HA   PT Frequency 2x / week   PT Duration 8 weeks   PT Treatment/Interventions ADLs/Self Care Home Management;Canalith Repostioning;Gait training;Stair training;Functional mobility training;Therapeutic activities;Therapeutic exercise;Balance training;Neuromuscular re-education;Patient/family education;Vestibular   PT Next Visit Plan SIGNIFICANT CARDIAC HISTORY-CHECK VITALS EACH VISIT.  review corner balance exercise and progress as needed; continue to work on each part of floor <> stand and add higher level LE strength and balance to HEP   Consulted and Agree with Plan of Care Patient      Patient will benefit from skilled therapeutic intervention in order to improve the  following deficits and impairments:  Decreased balance, Dizziness, Difficulty walking, Pain  Visit Diagnosis: Dizziness and giddiness  Unsteadiness on feet  BPPV (benign paroxysmal positional vertigo), bilateral     Problem List Patient Active Problem List   Diagnosis Date Noted  . Right lower quadrant abdominal pain 03/18/2017  . Chest pain at rest 11/16/2014  . DM (diabetes mellitus), type 2 with peripheral vascular complications ([place]) 38/09/1750  . Pain in the chest   . Chest pain 11/15/2014  . LLQ abdominal pain 11/05/2014  . Swelling of limb 03/29/2014  . Aftercare following surgery of the circulatory system, [place] 03/29/2014  . Peripheral vascular disease, unspecified ([place]) 03/12/2014  . Aneurysm of abdominal vessel ([place]) 02/19/2014  . Claudication of lower extremity ([place]) 02/08/2014  . Type II or unspecified type diabetes mellitus without mention of complication, uncontrolled 02/08/2014  . Atherosclerosis of renal artery ([place]) 09/04/2013  . Coronary artery disease   . Unstable angina ([place]) 06/09/2013  . Peripheral vascular disease with claudication ([place])   . Abnormal LFTs (liver function tests) 09/19/2012  . Diverticulosis 09/19/2012  . Hypokalemia 03/23/2012  . Benign hypertensive heart disease without heart failure 05/18/2011  . CAD (coronary artery disease) 05/18/2011  . History of abdominal aortic aneurysm repair 05/18/2011  . History of renal artery stenosis 05/18/2011  . Hyperlipidemia     Raylene Everts, PT, DPT 09/13/17    7:11 PM    [facility] [address] [place], [place], [address] Phone: [phone]   Fax:  [phone]  Name: Megan [name] MRN: [id number] Date of Birth: 12-25-1945

## 2017-09-13 NOTE — Patient Instructions (Signed)
Feet Together, Head Motion - Eyes Open and Eyes Closed    With eyes open, feet together, move head slowly: up and down 10 times, side to side 10 times.  Repeat with eyes closed: up and down 10 times, side to side 10 times Repeat 1 times per session. Do 2 sessions per day.

## 2017-09-15 ENCOUNTER — Encounter: Payer: Self-pay | Admitting: Physical Therapy

## 2017-09-15 ENCOUNTER — Ambulatory Visit: Payer: Medicare Other | Admitting: Physical Therapy

## 2017-09-15 DIAGNOSIS — R42 Dizziness and giddiness: Secondary | ICD-10-CM

## 2017-09-15 DIAGNOSIS — H8113 Benign paroxysmal vertigo, bilateral: Secondary | ICD-10-CM | POA: Diagnosis not present

## 2017-09-15 DIAGNOSIS — R2681 Unsteadiness on feet: Secondary | ICD-10-CM

## 2017-09-15 NOTE — Patient Instructions (Signed)
Box Step UP: Single Leg Stride    Start beside _4-6__ inch box next to a wall for hand support. Place right foot on box. Push off with foot on box, lift left leg in the air-pause for 2-3 seconds and then slowly lower left leg back to ground Repeat _10-12__ times. Repeat with other leg for set. Do _2__ sets per session.    Bracing With Backward Lunge (Standing)    Stand on top of step (4-6") with one hand against the wall for support. Find neutral spine. Tighten pelvic floor and abdominals and hold. Alternating legs, step backward and off step and bend front knee slightly to lower trunk. Repeat _10__ times per leg. Do _2__ times a day.  HIP / KNEE: Flexion / Extension, Wall Squat    Stance: shoulder-width on floor, against wall. Place feet in front of hips. Bend hips and knees to lower to a comfortable seated position. Keep back straight. Do not allow knees to bend past toes. Hold 10-12 seconds. Squeeze glutes and quads to stand. _3-4__ reps per set, _2__ sets per day   Bridge    Lying on back, legs bent 90, feet flat on floor. Press up hips and torso, keeping hands flat on mat/floor. Hold for __2__ breaths.  Breathe out when you are lifting your hips up    STOP USING LEG PRESS MACHINE  WHEN USING OTHER LEG MACHINES-BRACE WITH HANDS PUSHING DOWN AND FOR LEG EXTENSION-DO ONE LEG AT A TIME  DECREASE WEIGHT OR REPS AS NEEDED TO DECREASE STRAIN ON BACK .

## 2017-09-15 NOTE — Therapy (Signed)
[facility] [address] [place], [place], [address] Phone: [phone]   Fax:  [phone]  Physical Therapy Treatment  Patient Details  Name: Megan [name] MRN: [id number] Date of Birth: [birth year] Referring Provider: Penni Bombard, MD  Encounter Date: 09/15/2017      PT End of Session - 09/15/17 1149    Visit Number 5   Number of Visits 17   Date for PT Re-Evaluation 10/23/17   Authorization Type [place] visit PN and G code   PT Start Time 1100   PT Stop Time 1145   PT Time Calculation (min) 45 min   Activity Tolerance Patient tolerated treatment well   Behavior During Therapy [facility] for tasks assessed/performed      Past Medical History:  Diagnosis Date  . AAA (abdominal aortic aneurysm) ([place])    a. s/p stent grafting in 2009.  . Adenomatous colon polyp 05/2001  . Adenomatous duodenal polyp   . Anginal pain ([place])   . COPD (chronic obstructive pulmonary disease) ([place])   . Coronary artery disease    a. s/p CABG in 1995;  b. 05/2013 Neg MV, EF 82%;  c. 06/2013 Cath: LM nl, LAD 40-50p, LCX nl, RCA 80p/174m, VG->RCA->PDA->PLSA min irregs, LIMA->LAD atretic, vigorous LV fxn.  . Diabetes mellitus without complication ([place])   . Diverticulosis   . Eczema, dyshidrotic   . Eczema, dyshidrotic 1986   Dr. Mathis Fare  . Fibromyalgia   . GERD (gastroesophageal reflux disease)   . Hyperlipidemia   . Hypertension   . Migraine   . Myocardial infarction ([place])   . Osteoarthritis   . Peripheral arterial disease (HCC)    left leg diagnosed by Dr Mare Ferrari  . Renal artery stenosis ([place])    a. 2008 s/p PTA    Past Surgical History:  Procedure Laterality Date  . ABDOMINAL AORTAGRAM N/A 03/06/2014   Procedure: ABDOMINAL AORTAGRAM;  Surgeon: Rosetta Posner, MD;  Location: [facility] CATH LAB;  Service: Cardiovascular;  Laterality: N/A;  . ABDOMINAL AORTIC ANEURYSM REPAIR  2009   stenting  . ABDOMINAL  HYSTERECTOMY     Total, 1992  . CARDIAC CATHETERIZATION    . CARDIOVASCULAR STRESS TEST  11/11/2009   normal study  . CARPAL TUNNEL RELEASE  08/1993   left wrist  . CARPAL TUNNEL RELEASE  01/1997   right  . CORONARY ARTERY BYPASS GRAFT  07/1994   Triple by Dr Ceasar Mons  . HAND SURGERY Right 09/2002   Right hand pulley release  . LEFT HEART CATHETERIZATION WITH CORONARY/GRAFT ANGIOGRAM N/A 06/11/2013   Procedure: LEFT HEART CATHETERIZATION WITH [name];  Surgeon: Thayer Headings, MD;  Location: [facility] CATH LAB;  Service: Cardiovascular;  Laterality: N/A;  . MULTIPLE TOOTH EXTRACTIONS  2000   All upper teeth.  Has full dneture.   [name] RENAL ARTERY STENT  2008  . thyroid cyst apiration  05/1997 and 07/1998  . TONSILECTOMY, ADENOIDECTOMY, BILATERAL MYRINGOTOMY AND TUBES  1989  . TOTAL ABDOMINAL HYSTERECTOMY  1992    There were no vitals filed for this visit.      Subjective Assessment - 09/15/17 1104    Subjective Pt reports dizziness is improving but has to sit for a minute when she first gets up; corner balance exercises are going well.  LE strengthening exercises on machine caused some back pain.   Pertinent History AAA, angina, COPD, CAD s/p CABG, DM, diverticulosis, fibromyalgia, HTN, migraine, MI, OA, PAD, Renal artery  stenosis. back pain   Limitations Walking   Diagnostic tests MRI-unremarkable   Patient Stated Goals To stop being dizzy when she stands up and bends over   Currently in Pain? Yes   Pain Score 5    Pain Location Back   Pain Orientation Lower   Pain Descriptors / Indicators Sore   Pain Type Acute pain   Pain Onset Yesterday                         [facility] Adult PT Treatment/Exercise - 09/15/17 1107      Self-Care   Self-Care Other Self-Care Comments   Other Self-Care Comments  Reviewed safe mechanics for LE exercises at gym; emphasized use of UE depression for latissimus and core activation for stabilization during hip ABD/ADD machine,  performing one leg at a time for leg extension and ceasing leg press machine.  Also discussed decreasing weight or # of reps depending on exercise and targeted mm.     Knee/Hip Exercises: Standing   Lateral Step Up Right;Left;10 reps;Hand Hold: 1;Step Height: 4"   Step Down Right;Left;10 reps;Hand Hold: 1;Step Height: 4"  backwards lunges off step   Wall Squat 4 sets;10 reps     Knee/Hip Exercises: Supine   Bridges Limitations bilateral x 10 reps with cues to activate glutes first      Box Step UP: Single Leg Stride    Start beside _4-6__ inch box next to a wall for hand support. Place right foot on box. Push off with foot on box, lift left leg in the air-pause for 2-3 seconds and then slowly lower left leg back to ground Repeat _10-12__ times. Repeat with other leg for set. Do _2__ sets per session.    Bracing With Backward Lunge (Standing)    Stand on top of step (4-6") with one hand against the wall for support. Find neutral spine. Tighten pelvic floor and abdominals and hold. Alternating legs, step backward and off step and bend front knee slightly to lower trunk. Repeat _10__ times per leg. Do _2__ times a day.  HIP / KNEE: Flexion / Extension, Wall Squat    Stance: shoulder-width on floor, against wall. Place feet in front of hips. Bend hips and knees to lower to a comfortable seated position. Keep back straight. Do not allow knees to bend past toes. Hold 10-12 seconds. Squeeze glutes and quads to stand. _3-4__ reps per set, _2__ sets per day   Bridge    Lying on back, legs bent 90, feet flat on floor. Press up hips and torso, keeping hands flat on mat/floor. Hold for __2__ breaths.  Breathe out when you are lifting your hips up    STOP USING LEG PRESS MACHINE  WHEN USING OTHER LEG MACHINES-BRACE WITH HANDS PUSHING DOWN AND FOR LEG EXTENSION-DO ONE LEG AT A TIME  DECREASE WEIGHT OR REPS AS NEEDED TO DECREASE STRAIN ON BACK           PT Education -  09/15/17 1148    Education provided Yes   Education Details safer body mechanics at gym to reduce strain on back; LE strengthening exercises to replace leg press   Person(s) Educated Patient   Methods Explanation;Demonstration;Handout   Comprehension Verbalized understanding;Returned demonstration          PT Short Term Goals - 09/08/17 1157      PT SHORT TERM GOAL #1   Title Pt will participate in further balance testing: FGA  Time 4   Period Weeks   Status Achieved     PT SHORT TERM GOAL #2   Title Pt will report <2/10 dizziness with quick head turns and bending down to decrease risk for falls with functional activities   Time 4   Period Weeks   Status On-going   Target Date 09/23/17     PT SHORT TERM GOAL #3   Title Pt will improve gait velocity to > or = 3.8 ft/sec to decrease falls risk in community   Baseline 3.5 ft/sec   Time 4   Period Weeks   Status On-going   Target Date 09/23/17           PT Long Term Goals - 09/08/17 1157      PT LONG TERM GOAL #1   Title Pt will demonstrate independence with balance and vestibular HEP   Time 8   Period Weeks   Status On-going   Target Date 10/23/17     PT LONG TERM GOAL #2   Title Pt will report 0/10 vertigo with positional testing, head turns and bending down   Time 8   Period Weeks   Status On-going   Target Date 10/23/17     PT LONG TERM GOAL #3   Title Pt will demonstrate decreased falls risk as indicated by increase in FGA score by 4 points   Baseline 25/30   Time 8   Period Weeks   Status Revised   Target Date 10/23/17     PT LONG TERM GOAL #5   Title Pt will report increased confidence with balance as indicated by increase in ABC score by 15%   Baseline 31.3%   Time 8   Period Weeks   Status On-going   Target Date 10/23/17               Plan - 09/15/17 1149    Clinical Impression Statement Transitioned to focusing on safer body mechanics and activation of lats and core for  stabilization when performing LE strengthening machines and addition of LE strengthening exercises for pt to perform at the gym instead of leg press machine due to pt reporting back pain.  Adjusted lateral squats to step ups due to knee pain.  Pt tolerated exercises well and will monitor symptoms over the weekend.  Will f/u on dizziness at next session.     Rehab Potential Good   Clinical Impairments Affecting Rehab Potential extensive medical history, chronic HA   PT Frequency 2x / week   PT Duration 8 weeks   PT Treatment/Interventions ADLs/Self Care Home Management;Canalith Repostioning;Gait training;Stair training;Functional mobility training;Therapeutic activities;Therapeutic exercise;Balance training;Neuromuscular re-education;Patient/family education;Vestibular   PT Next Visit Plan SIGNIFICANT CARDIAC HISTORY-CHECK VITALS EACH VISIT.  check anterior cupulo; assess symptoms with LE strengthening exercises.  review corner balance exercise and progress as needed; continue to work on each part of floor <> stand and add higher level LE strength and balance to HEP   Consulted and Agree with Plan of Care Patient      Patient will benefit from skilled therapeutic intervention in order to improve the following deficits and impairments:  Decreased balance, Dizziness, Difficulty walking, Pain  Visit Diagnosis: Unsteadiness on feet  Dizziness and giddiness     Problem List Patient Active Problem List   Diagnosis Date Noted  . Right lower quadrant abdominal pain 03/18/2017  . Chest pain at rest 11/16/2014  . DM (diabetes mellitus), type 2 with peripheral vascular complications ([place]) 67/89/3810  .  Pain in the chest   . Chest pain 11/15/2014  . LLQ abdominal pain 11/05/2014  . Swelling of limb 03/29/2014  . Aftercare following surgery of the circulatory system, [place] 03/29/2014  . Peripheral vascular disease, unspecified ([place]) 03/12/2014  . Aneurysm of abdominal vessel ([place]) 02/19/2014  .  Claudication of lower extremity ([place]) 02/08/2014  . Type II or unspecified type diabetes mellitus without mention of complication, uncontrolled 02/08/2014  . Atherosclerosis of renal artery ([place]) 09/04/2013  . Coronary artery disease   . Unstable angina ([place]) 06/09/2013  . Peripheral vascular disease with claudication ([place])   . Abnormal LFTs (liver function tests) 09/19/2012  . Diverticulosis 09/19/2012  . Hypokalemia 03/23/2012  . Benign hypertensive heart disease without heart failure 05/18/2011  . CAD (coronary artery disease) 05/18/2011  . History of abdominal aortic aneurysm repair 05/18/2011  . History of renal artery stenosis 05/18/2011  . Hyperlipidemia     Raylene Everts, PT, DPT 09/15/17    11:56 AM    [facility] [address] [place], [place], [address] Phone: [phone]   Fax:  [phone]  Name: Megan [name] MRN: [id number] Date of Birth: 1946-01-10

## 2017-09-19 ENCOUNTER — Ambulatory Visit: Payer: Medicare Other | Admitting: Physical Therapy

## 2017-09-19 ENCOUNTER — Encounter: Payer: Self-pay | Admitting: Physical Therapy

## 2017-09-19 DIAGNOSIS — R42 Dizziness and giddiness: Secondary | ICD-10-CM

## 2017-09-19 DIAGNOSIS — H8113 Benign paroxysmal vertigo, bilateral: Secondary | ICD-10-CM

## 2017-09-19 DIAGNOSIS — R2681 Unsteadiness on feet: Secondary | ICD-10-CM

## 2017-09-19 NOTE — Therapy (Signed)
[facility] [address] [place], [place], [address] Phone: [phone]   Fax:  [phone]  Physical Therapy Treatment  Patient Details  Name: Megan [name] MRN: [id number] Date of Birth: 1946/12/04 Referring Provider: Penni Bombard, MD  Encounter Date: 09/19/2017      PT End of Session - 09/19/17 1239    Visit Number 5   Number of Visits 17   Date for PT Re-Evaluation 10/23/17   Authorization Type [place] visit PN and G code   PT Start Time 1152   PT Stop Time 1230   PT Time Calculation (min) 38 min   Activity Tolerance Patient tolerated treatment well   Behavior During Therapy [facility] for tasks assessed/performed      Past Medical History:  Diagnosis Date  . AAA (abdominal aortic aneurysm) ([place])    a. s/p stent grafting in 2009.  . Adenomatous colon polyp 05/2001  . Adenomatous duodenal polyp   . Anginal pain ([place])   . COPD (chronic obstructive pulmonary disease) ([place])   . Coronary artery disease    a. s/p CABG in 1995;  b. 05/2013 Neg MV, EF 82%;  c. 06/2013 Cath: LM nl, LAD 40-50p, LCX nl, RCA 80p/128m, VG->RCA->PDA->PLSA min irregs, LIMA->LAD atretic, vigorous LV fxn.  . Diabetes mellitus without complication ([place])   . Diverticulosis   . Eczema, dyshidrotic   . Eczema, dyshidrotic 1986   Dr. Mathis Fare  . Fibromyalgia   . GERD (gastroesophageal reflux disease)   . Hyperlipidemia   . Hypertension   . Migraine   . Myocardial infarction ([place])   . Osteoarthritis   . Peripheral arterial disease (HCC)    left leg diagnosed by Dr Mare Ferrari  . Renal artery stenosis ([place])    a. 2008 s/p PTA    Past Surgical History:  Procedure Laterality Date  . ABDOMINAL AORTAGRAM N/A 03/06/2014   Procedure: ABDOMINAL AORTAGRAM;  Surgeon: Rosetta Posner, MD;  Location[facility] CATH LAB;  Service: Cardiovascular;  Laterality: N/A;  . ABDOMINAL AORTIC ANEURYSM REPAIR  2009   stenting  . ABDOMINAL  HYSTERECTOMY     Total, 1992  . CARDIAC CATHETERIZATION    . CARDIOVASCULAR STRESS TEST  11/11/2009   normal study  . CARPAL TUNNEL RELEASE  08/1993   left wrist  . CARPAL TUNNEL RELEASE  01/1997   right  . CORONARY ARTERY BYPASS GRAFT  07/1994   Triple by Dr Ceasar Mons  . HAND SURGERY Right 09/2002   Right hand pulley release  . LEFT HEART CATHETERIZATION WITH CORONARY/GRAFT ANGIOGRAM N/A 06/11/2013   Procedure: LEFT HEART CATHETERIZATION WITH [name];  Surgeon: Thayer Headings, MD;  Location: [facility] CATH LAB;  Service: Cardiovascular;  Laterality: N/A;  . MULTIPLE TOOTH EXTRACTIONS  2000   All upper teeth.  Has full dneture.   [name] RENAL ARTERY STENT  2008  . thyroid cyst apiration  05/1997 and 07/1998  . TONSILECTOMY, ADENOIDECTOMY, BILATERAL MYRINGOTOMY AND TUBES  1989  . TOTAL ABDOMINAL HYSTERECTOMY  1992    There were no vitals filed for this visit.      Subjective Assessment - 09/19/17 1156    Subjective Pt reports dizziness is a little better from last week; did not go to the gym this weekend due to weather and power outages, but did try the wall squats at home.   Pertinent History AAA, angina, COPD, CAD s/p CABG, DM, diverticulosis, fibromyalgia, HTN, migraine, MI, OA, PAD, Renal artery stenosis.  back pain   Limitations Walking   Diagnostic tests MRI-unremarkable   Patient Stated Goals To stop being dizzy when she stands up and bends over   Currently in Pain? No/denies                Vestibular Assessment - 09/19/17 1158      Vestibular Assessment   General Observation Reports one episode of lying down and having sensation of spinning     Positional Testing   Dix-Hallpike Dix-Hallpike Right;Dix-Hallpike Left     Dix-Hallpike Left   Dix-Hallpike Left Duration 30   Dix-Hallpike Left Symptoms Downbeat Nystagmus  with frenzel lenses pt with R rotary nystagmus     Sidelying Right   Sidelying Right Duration 0   Sidelying Right Symptoms No nystagmus   with frenzel lenses pt with downbeat, R rotary nystagmus                  Vestibular Treatment/Exercise - 09/19/17 1213      Vestibular Treatment/Exercise   Vestibular Treatment Provided Canalith Repositioning   Canalith Repositioning [place] Procedure Right Anterior;Epley Manuever Right      EPLEY MANUEVER RIGHT   Number of Reps  1   Overall Response No change   Response Details  (Epley maneuver for R anterior canal-head turned L to begin     [place] Procedure Right Anterior   Number of Reps  1   Overall Response  No change               PT Education - 09/19/17 1239    Education provided Yes   Education Details will continue to address anterior canal cupulolithiasis and if not able to clear will focus more on habituation and compensation with balance   Person(s) Educated Patient   Methods Explanation   Comprehension Verbalized understanding          PT Short Term Goals - 09/08/17 1157      PT SHORT TERM GOAL #1   Title Pt will participate in further balance testing: FGA    Time 4   Period Weeks   Status Achieved     PT SHORT TERM GOAL #2   Title Pt will report <2/10 dizziness with quick head turns and bending down to decrease risk for falls with functional activities   Time 4   Period Weeks   Status On-going   Target Date 09/23/17     PT SHORT TERM GOAL #3   Title Pt will improve gait velocity to > or = 3.8 ft/sec to decrease falls risk in community   Baseline 3.5 ft/sec   Time 4   Period Weeks   Status On-going   Target Date 09/23/17           PT Long Term Goals - 09/08/17 1157      PT LONG TERM GOAL #1   Title Pt will demonstrate independence with balance and vestibular HEP   Time 8   Period Weeks   Status On-going   Target Date 10/23/17     PT LONG TERM GOAL #2   Title Pt will report 0/10 vertigo with positional testing, head turns and bending down   Time 8   Period Weeks   Status On-going   Target Date 10/23/17     PT  LONG TERM GOAL #3   Title Pt will demonstrate decreased falls risk as indicated by increase in FGA score by 4 points   Baseline 25/30   Time 8  Period Weeks   Status Revised   Target Date 10/23/17     PT LONG TERM GOAL #5   Title Pt will report increased confidence with balance as indicated by increase in ABC score by 15%   Baseline 31.3%   Time 8   Period Weeks   Status On-going   Target Date 10/23/17               Plan - 09/19/17 1241    Clinical Impression Statement Continued to focus on assessment and treatment of peripheral vestibular canals; due pt using fixation to suppress nystagmus performed assessment with use of frenzel lenses which continued to reveal persistent, low amplitude downbeat, R rotary nystagmus.  Treated again with R Liberatory maneuver and then CRM with head turn to L initially to bias R anterior canal.  Pt reported slight vertigo when returning to upright.  Will continue to address but if unable to clear canals will shift focus of treatment sessions to habituation and compensation with balance.     Rehab Potential Good   Clinical Impairments Affecting Rehab Potential extensive medical history, chronic HA   PT Frequency 2x / week   PT Duration 8 weeks   PT Treatment/Interventions ADLs/Self Care Home Management;Canalith Repostioning;Gait training;Stair training;Functional mobility training;Therapeutic activities;Therapeutic exercise;Balance training;Neuromuscular re-education;Patient/family education;Vestibular   PT Next Visit Plan SIGNIFICANT CARDIAC HISTORY-CHECK VITALS EACH VISIT.   CHECK STG; With frenzel lenses reassess for R anterior cupulo and treat if indicated; review corner balance exercise and progress as needed; continue to work on each part of floor <> stand and add higher level LE strength and balance to HEP   Consulted and Agree with Plan of Care Patient      Patient will benefit from skilled therapeutic intervention in order to improve the  following deficits and impairments:  Decreased balance, Dizziness, Difficulty walking, Pain  Visit Diagnosis: Unsteadiness on feet  Dizziness and giddiness  BPPV (benign paroxysmal positional vertigo), bilateral     Problem List Patient Active Problem List   Diagnosis Date Noted  . Right lower quadrant abdominal pain 03/18/2017  . Chest pain at rest 11/16/2014  . DM (diabetes mellitus), type 2 with peripheral vascular complications ([place]) 96/22/2979  . Pain in the chest   . Chest pain 11/15/2014  . LLQ abdominal pain 11/05/2014  . Swelling of limb 03/29/2014  . Aftercare following surgery of the circulatory system, [place] 03/29/2014  . Peripheral vascular disease, unspecified ([place]) 03/12/2014  . Aneurysm of abdominal vessel ([place]) 02/19/2014  . Claudication of lower extremity ([place]) 02/08/2014  . Type II or unspecified type diabetes mellitus without mention of complication, uncontrolled 02/08/2014  . Atherosclerosis of renal artery ([place]) 09/04/2013  . Coronary artery disease   . Unstable angina ([place]) 06/09/2013  . Peripheral vascular disease with claudication ([place])   . Abnormal LFTs (liver function tests) 09/19/2012  . Diverticulosis 09/19/2012  . Hypokalemia 03/23/2012  . Benign hypertensive heart disease without heart failure 05/18/2011  . CAD (coronary artery disease) 05/18/2011  . History of abdominal aortic aneurysm repair 05/18/2011  . History of renal artery stenosis 05/18/2011  . Hyperlipidemia     Raylene Everts, PT, DPT 09/19/17    12:46 PM    [facility] [address] [place], [place], [address] Phone: [phone]   Fax:  [phone]  Name: Megan [name] MRN: [id number] Date of Birth: 13-Mar-1946

## 2017-09-22 ENCOUNTER — Ambulatory Visit: Payer: Medicare Other | Admitting: Rehabilitative and Restorative Service Providers"

## 2017-09-22 DIAGNOSIS — H8113 Benign paroxysmal vertigo, bilateral: Secondary | ICD-10-CM | POA: Diagnosis not present

## 2017-09-22 DIAGNOSIS — R42 Dizziness and giddiness: Secondary | ICD-10-CM

## 2017-09-22 DIAGNOSIS — R2681 Unsteadiness on feet: Secondary | ICD-10-CM

## 2017-09-22 NOTE — Patient Instructions (Signed)
Gaze Stabilization - Tip Card  1.Target must remain in focus, not blurry, and appear stationary while head is in motion. 2.Perform exercises with small head movements (45 to either side of midline). 3.Increase speed of head motion so long as target is in focus. 4.If you wear eyeglasses, be sure you can see target through lens (therapist will give specific instructions for bifocal / progressive lenses). 5.These exercises may provoke dizziness or nausea. Work through these symptoms. If too dizzy, slow head movement slightly. Rest between each exercise. 6.Exercises demand concentration; avoid distractions. 7.For safety, perform standing exercises close to a counter, wall, corner, or next to someone.  Copyright  VHI. All rights reserved.   Gaze Stabilization - Standing Feet Apart   Feet shoulder width apart, keeping eyes on target on wall 3 feet away, tilt head down slightly and move head side to side for 30 seconds. R *Work up to tolerating 60 seconds, as able. Do 2-3 sessions per day.  Copyright  VHI. All rights reserved.

## 2017-09-22 NOTE — Therapy (Signed)
[facility] [address] [place], [place], [address] Phone: [phone]   Fax:  [phone]  Physical Therapy Treatment  Patient Details  Name: Megan [name] MRN: [id number] Date of Birth: 1946/04/14 Referring Provider: Penni Bombard, MD  Encounter Date: 09/22/2017      PT End of Session - 09/22/17 2029    Visit Number 6   Number of Visits 17   Date for PT Re-Evaluation 10/23/17   Authorization Type [place] visit PN and G code   PT Start Time 1108   PT Stop Time 1150   PT Time Calculation (min) 42 min   Activity Tolerance Patient tolerated treatment well   Behavior During Therapy [facility] for tasks assessed/performed      Past Medical History:  Diagnosis Date  . AAA (abdominal aortic aneurysm) ([place])    a. s/p stent grafting in 2009.  . Adenomatous colon polyp 05/2001  . Adenomatous duodenal polyp   . Anginal pain ([place])   . COPD (chronic obstructive pulmonary disease) ([place])   . Coronary artery disease    a. s/p CABG in 1995;  b. 05/2013 Neg MV, EF 82%;  c. 06/2013 Cath: LM nl, LAD 40-50p, LCX nl, RCA 80p/149m, VG->RCA->PDA->PLSA min irregs, LIMA->LAD atretic, vigorous LV fxn.  . Diabetes mellitus without complication ([place])   . Diverticulosis   . Eczema, dyshidrotic   . Eczema, dyshidrotic 1986   Dr. Mathis Fare  . Fibromyalgia   . GERD (gastroesophageal reflux disease)   . Hyperlipidemia   . Hypertension   . Migraine   . Myocardial infarction ([place])   . Osteoarthritis   . Peripheral arterial disease (HCC)    left leg diagnosed by Dr Mare Ferrari  . Renal artery stenosis ([place])    a. 2008 s/p PTA    Past Surgical History:  Procedure Laterality Date  . ABDOMINAL AORTAGRAM N/A 03/06/2014   Procedure: ABDOMINAL AORTAGRAM;  Surgeon: Rosetta Posner, MD;  Location: [facility] CATH LAB;  Service: Cardiovascular;  Laterality: N/A;  . ABDOMINAL AORTIC ANEURYSM REPAIR  2009   stenting  . ABDOMINAL  HYSTERECTOMY     Total, 1992  . CARDIAC CATHETERIZATION    . CARDIOVASCULAR STRESS TEST  11/11/2009   normal study  . CARPAL TUNNEL RELEASE  08/1993   left wrist  . CARPAL TUNNEL RELEASE  01/1997   right  . CORONARY ARTERY BYPASS GRAFT  07/1994   Triple by Dr Ceasar Mons  . HAND SURGERY Right 09/2002   Right hand pulley release  . LEFT HEART CATHETERIZATION WITH CORONARY/GRAFT ANGIOGRAM N/A 06/11/2013   Procedure: LEFT HEART CATHETERIZATION WITH [name];  Surgeon: Thayer Headings, MD;  Location: [facility] CATH LAB;  Service: Cardiovascular;  Laterality: N/A;  . MULTIPLE TOOTH EXTRACTIONS  2000   All upper teeth.  Has full dneture.   [name] RENAL ARTERY STENT  2008  . thyroid cyst apiration  05/1997 and 07/1998  . TONSILECTOMY, ADENOIDECTOMY, BILATERAL MYRINGOTOMY AND TUBES  1989  . TOTAL ABDOMINAL HYSTERECTOMY  1992    There were no vitals filed for this visit.      Subjective Assessment - 09/22/17 1109    Subjective The patient notes she feels dizziness is improved but not gone.  She feels symptoms are worse in the morning   Pertinent History AAA, angina, COPD, CAD s/p CABG, DM, diverticulosis, fibromyalgia, HTN, migraine, MI, OA, PAD, Renal artery stenosis. back pain   Diagnostic tests MRI-unremarkable   Patient Stated Goals  To stop being dizzy when she stands up and bends over   Currently in Pain? No/denies                Vestibular Assessment - 09/22/17 1113      Vestibular Assessment   General Observation Improving, still present in the morning.  Ater initial positional testing repeated with frenzel lenses donned.  Viewed 2-3 beats of upbeat, left rotary nystagmus with L dix hallppike, negative R horizontal roll and 5 beats of apogeotropic nystagmus wiht L rolling (no subjective reports).      Vestibulo-Occular Reflex   Comment HIT with glasses donned: positive bilaterally with greater refixation saccade noted to the left, however with gaze x 1 notes worse blurring  to the right side.       Positional Testing   Dix-Hallpike Dix-Hallpike Right;Dix-Hallpike Left   Sidelying Test Sidelying Right;Sidelying Left   Horizontal Canal Testing Horizontal Canal Right;Horizontal Canal Left     Dix-Hallpike Right   Dix-Hallpike Right Duration notes a mild sensation of dizziness, no nystagmus viewed in room light   Dix-Hallpike Right Symptoms No nystagmus     Dix-Hallpike Left   Dix-Hallpike Left Duration first 10 seconds noted upbeating, left rotary nystagmus, then stopped in room light   Dix-Hallpike Left Symptoms Upbeat, left rotatory nystagmus     Sidelying Right   Sidelying Right Duration none   Sidelying Right Symptoms No nystagmus     Sidelying Left   Sidelying Left Duration none *this was done after left dix hallpike and may have fatigued symptoms.     Sidelying Left Symptoms No nystagmus     Horizontal Canal Right   Horizontal Canal Right Duration 0   Horizontal Canal Right Symptoms Normal     Horizontal Canal Left   Horizontal Canal Left Duration 0   Horizontal Canal Left Symptoms Normal                  Vestibular Treatment/Exercise - 09/22/17 2032      Vestibular Treatment/Exercise   Vestibular Treatment Provided Canalith Repositioning;Gaze   Canalith Repositioning Epley Manuever Left   Gaze Exercises X1 Viewing Horizontal      EPLEY MANUEVER LEFT   Number of Reps  1   Overall Response  Improved Symptoms    RESPONSE DETAILS LEFT Rechecked with frenzels donned without nystagmus viewed and no dizziness noted.     X1 Viewing Horizontal   Foot Position standing feet apart   Comments x 30 seconds emphasizing visual fixation and speed of movement as fast as she can tolerate before visual blurring occurs.               PT Education - 09/22/17 2029    Education provided Yes   Education Details HEP: added gaze x 1 adaptation in standing   Person(s) Educated Patient   Methods Explanation;Demonstration;Handout    Comprehension Verbalized understanding;Returned demonstration          PT Short Term Goals - 09/08/17 1157      PT SHORT TERM GOAL #1   Title Pt will participate in further balance testing: FGA    Time 4   Period Weeks   Status Achieved     PT SHORT TERM GOAL #2   Title Pt will report <2/10 dizziness with quick head turns and bending down to decrease risk for falls with functional activities   Time 4   Period Weeks   Status On-going   Target Date 09/23/17  PT SHORT TERM GOAL #3   Title Pt will improve gait velocity to > or = 3.8 ft/sec to decrease falls risk in community   Baseline 3.5 ft/sec   Time 4   Period Weeks   Status On-going   Target Date 09/23/17           PT Long Term Goals - 09/08/17 1157      PT LONG TERM GOAL #1   Title Pt will demonstrate independence with balance and vestibular HEP   Time 8   Period Weeks   Status On-going   Target Date 10/23/17     PT LONG TERM GOAL #2   Title Pt will report 0/10 vertigo with positional testing, head turns and bending down   Time 8   Period Weeks   Status On-going   Target Date 10/23/17     PT LONG TERM GOAL #3   Title Pt will demonstrate decreased falls risk as indicated by increase in FGA score by 4 points   Baseline 25/30   Time 8   Period Weeks   Status Revised   Target Date 10/23/17     PT LONG TERM GOAL #5   Title Pt will report increased confidence with balance as indicated by increase in ABC score by 15%   Baseline 31.3%   Time 8   Period Weeks   Status On-going   Target Date 10/23/17               Plan - 09/22/17 2034    Clinical Impression Statement At today's session, the patient appears to have L posterior canalithiasis and hypofunction with + head impulse testing with glasses donned and visual blurring during gaze x 1 noted.  PT treated with L epley's and Gaze x 1 viewing.  Anticipate multi-factorial nature of dizziness and imbalance.  PT to continue working on functional  balance/dynamic gait as vertigo improves.    PT Treatment/Interventions ADLs/Self Care Home Management;Canalith Repostioning;Gait training;Stair training;Functional mobility training;Therapeutic activities;Therapeutic exercise;Balance training;Neuromuscular re-education;Patient/family education;Vestibular   PT Next Visit Plan check vitals next visit;  CHECK STGs; High level balance activities, dynamic gait and floor<>stand transfers.   Consulted and Agree with Plan of Care Patient      Patient will benefit from skilled therapeutic intervention in order to improve the following deficits and impairments:  Decreased balance, Dizziness, Difficulty walking, Pain  Visit Diagnosis: Unsteadiness on feet  Dizziness and giddiness  BPPV (benign paroxysmal positional vertigo), bilateral     Problem List Patient Active Problem List   Diagnosis Date Noted  . Right lower quadrant abdominal pain 03/18/2017  . Chest pain at rest 11/16/2014  . DM (diabetes mellitus), type 2 with peripheral vascular complications ([place]) 16/09/9603  . Pain in the chest   . Chest pain 11/15/2014  . LLQ abdominal pain 11/05/2014  . Swelling of limb 03/29/2014  . Aftercare following surgery of the circulatory system, [place] 03/29/2014  . Peripheral vascular disease, unspecified ([place]) 03/12/2014  . Aneurysm of abdominal vessel ([place]) 02/19/2014  . Claudication of lower extremity ([place]) 02/08/2014  . Type II or unspecified type diabetes mellitus without mention of complication, uncontrolled 02/08/2014  . Atherosclerosis of renal artery ([place]) 09/04/2013  . Coronary artery disease   . Unstable angina ([place]) 06/09/2013  . Peripheral vascular disease with claudication ([place])   . Abnormal LFTs (liver function tests) 09/19/2012  . Diverticulosis 09/19/2012  . Hypokalemia 03/23/2012  . Benign hypertensive heart disease without heart failure 05/18/2011  . CAD (coronary  artery disease) 05/18/2011  . History of abdominal aortic  aneurysm repair 05/18/2011  . History of renal artery stenosis 05/18/2011  . Hyperlipidemia     Zavien Clubb, PT 09/22/2017, 8:36 PM  [facility] [address] [place], [place], [address] Phone: [phone]   Fax:  [phone]  Name: Megan [name] MRN: [id number] Date of Birth: 1946/03/11

## 2017-09-26 ENCOUNTER — Ambulatory Visit: Payer: Medicare Other | Admitting: Rehabilitative and Restorative Service Providers"

## 2017-09-26 ENCOUNTER — Encounter: Payer: Medicare Other | Admitting: Physical Therapy

## 2017-09-26 VITALS — BP 159/73

## 2017-09-26 DIAGNOSIS — H8113 Benign paroxysmal vertigo, bilateral: Secondary | ICD-10-CM

## 2017-09-26 DIAGNOSIS — R42 Dizziness and giddiness: Secondary | ICD-10-CM

## 2017-09-26 DIAGNOSIS — R2681 Unsteadiness on feet: Secondary | ICD-10-CM

## 2017-09-26 NOTE — Patient Instructions (Addendum)
Gaze Stabilization - Tip Card  1.Target must remain in focus, not blurry, and appear stationary while head is in motion. 2.Perform exercises with small head movements (45 to either side of midline). 3.Increase speed of head motion so long as target is in focus. 4.If you wear eyeglasses, be sure you can see target through lens (therapist will give specific instructions for bifocal / progressive lenses). 5.These exercises may provoke dizziness or nausea. Work through these symptoms. If too dizzy, slow head movement slightly. Rest between each exercise. 6.Exercises demand concentration; avoid distractions. 7.For safety, perform standing exercises close to a counter, wall, corner, or next to someone.  Copyright  VHI. All rights reserved.   Gaze Stabilization - Standing Feet Apart   Feet shoulder width apart, keeping eyes on target on wall 3 feet away, tilt head down slightly and move head side to side for 30 seconds. R *Work up to tolerating 60 seconds, as able. Do 2-3 sessions per day.  Copyright  VHI. All rights reserved.  Box Step UP: Single Leg Stride    **USE STEP AT HOME**Start beside _4-6__ inch box next to a wall for hand support. Place right foot on box. Push off with foot on box, lift left leg in the air-pause for 2-3 seconds and then slowly lower left leg back to ground Repeat _10-12__ times. Repeat with other leg for set. Do _2__ sets per session.    Bracing With Backward Lunge (Standing)    **USE STEP AT HOME**Stand on top of step (4-6") with one hand against the wall for support. Find neutral spine. Tighten pelvic floor and abdominals and hold. Alternating legs, step backward and off step and bend front knee slightly to lower trunk. Repeat _10__ times per leg. Do _2__ times a day.  HIP / KNEE: Flexion / Extension, Wall Squat    Stance: shoulder-width on floor, against wall. Place feet in front of hips. Bend hips and knees to lower to a comfortable seated  position. Keep back straight. Do not allow knees to bend past toes. Hold 10-12 seconds. Squeeze glutes and quads to stand. _3-4__ reps per set, _2__ sets per day   Bridge    Lying on back, legs bent 90, feet flat on floor. Press up hips and torso, keeping hands flat on mat/floor. Hold for __2__ breaths.  Breathe out when you are lifting your hips up    STOP USING LEG PRESS MACHINE  WHEN USING OTHER LEG MACHINES-BRACE WITH HANDS PUSHING DOWN AND FOR LEG EXTENSION-DO ONE LEG AT A TIME  DECREASE WEIGHT OR REPS AS NEEDED TO DECREASE STRAIN ON BACK  Feet Together, Head Motion - Eyes Open and Eyes Closed    With eyes open, feet together, move head slowly: up and down 10 times, side to side 10 times.  Repeat with eyes closed: up and down 10 times, side to side 10 times Repeat 1 times per session. Do 2 sessions per day.     Forward Lean (Half-Kneeling)    Start with Kneeling on both legs, one hand on couch beside you.  Keeping your balance, step left leg forwards, slowly lean forward over left leg, keeping stomach tight.  Return left leg under you and repeat with right leg.   Repeat _10___ times per side. Do __1__ sets per session. Do _2___ sessions per day.   Throwing / Tossing (Active)    *Use a tennis bal and toss back and forth between the right and left hand.  Throw in an arc (  up and over) between hands. Standing in place and repeat 20 times.   Copyright  VHI. All rights reserved.

## 2017-09-26 NOTE — Therapy (Signed)
[facility] [address] [place], [place], [address] Phone: [phone]   Fax:  [phone]  Physical Therapy Treatment  Patient Details  Name: Megan [name] MRN: [id number] Date of Birth: 03-07-1946 Referring Provider: Penni Bombard, MD  Encounter Date: 09/26/2017      PT End of Session - 09/26/17 1419    Visit Number 7   Number of Visits 17   Date for PT Re-Evaluation 10/23/17   Authorization Type [place] visit PN and G code   PT Start Time 1410   PT Stop Time 1500   PT Time Calculation (min) 50 min   Activity Tolerance Patient tolerated treatment well   Behavior During Therapy [facility] for tasks assessed/performed      Past Medical History:  Diagnosis Date  . AAA (abdominal aortic aneurysm) ([place])    a. s/p stent grafting in 2009.  . Adenomatous colon polyp 05/2001  . Adenomatous duodenal polyp   . Anginal pain ([place])   . COPD (chronic obstructive pulmonary disease) ([place])   . Coronary artery disease    a. s/p CABG in 1995;  b. 05/2013 Neg MV, EF 82%;  c. 06/2013 Cath: LM nl, LAD 40-50p, LCX nl, RCA 80p/16m, VG->RCA->PDA->PLSA min irregs, LIMA->LAD atretic, vigorous LV fxn.  . Diabetes mellitus without complication ([place])   . Diverticulosis   . Eczema, dyshidrotic   . Eczema, dyshidrotic 1986   Dr. Mathis Fare  . Fibromyalgia   . GERD (gastroesophageal reflux disease)   . Hyperlipidemia   . Hypertension   . Migraine   . Myocardial infarction ([place])   . Osteoarthritis   . Peripheral arterial disease (HCC)    left leg diagnosed by Dr Mare Ferrari  . Renal artery stenosis ([place])    a. 2008 s/p PTA    Past Surgical History:  Procedure Laterality Date  . ABDOMINAL AORTAGRAM N/A 03/06/2014   Procedure: ABDOMINAL AORTAGRAM;  Surgeon: Rosetta Posner, MD;  Location: [facility] CATH LAB;  Service: Cardiovascular;  Laterality: N/A;  . ABDOMINAL AORTIC ANEURYSM REPAIR  2009   stenting  . ABDOMINAL  HYSTERECTOMY     Total, 1992  . CARDIAC CATHETERIZATION    . CARDIOVASCULAR STRESS TEST  11/11/2009   normal study  . CARPAL TUNNEL RELEASE  08/1993   left wrist  . CARPAL TUNNEL RELEASE  01/1997   right  . CORONARY ARTERY BYPASS GRAFT  07/1994   Triple by Dr Ceasar Mons  . HAND SURGERY Right 09/2002   Right hand pulley release  . LEFT HEART CATHETERIZATION WITH CORONARY/GRAFT ANGIOGRAM N/A 06/11/2013   Procedure: LEFT HEART CATHETERIZATION WITH [name];  Surgeon: Thayer Headings, MD;  Location: [facility] CATH LAB;  Service: Cardiovascular;  Laterality: N/A;  . MULTIPLE TOOTH EXTRACTIONS  2000   All upper teeth.  Has full dneture.   [name] RENAL ARTERY STENT  2008  . thyroid cyst apiration  05/1997 and 07/1998  . TONSILECTOMY, ADENOIDECTOMY, BILATERAL MYRINGOTOMY AND TUBES  1989  . TOTAL ABDOMINAL HYSTERECTOMY  1992    Vitals:   09/26/17 1454  BP: (!) 159/73        Subjective Assessment - 09/26/17 1414    Subjective The patient feels like diziness is better, but still there.  She feels theletter exercise is getting better.  She cannot do step up exercise well at the gym due to space.  Her cc: staggering with turns.   Pertinent History AAA, angina, COPD, CAD s/p CABG, DM, diverticulosis,  fibromyalgia, HTN, migraine, MI, OA, PAD, Renal artery stenosis. back pain   Diagnostic tests MRI-unremarkable   Patient Stated Goals To stop being dizzy when she stands up and bends over   Currently in Pain? No/denies                Vestibular Assessment - 09/26/17 1416      Positional Testing   Dix-Hallpike Dix-Hallpike Right;Dix-Hallpike Left     Dix-Hallpike Right   Dix-Hallpike Right Duration 0   Dix-Hallpike Right Symptoms No nystagmus     Dix-Hallpike Left   Dix-Hallpike Left Duration 10 seconds with upbeating, rotary nystagmus                 [facility] Adult PT Treatment/Exercise - 09/26/17 1514      Neuro Re-ed    Neuro Re-ed Details  Corner balance with feet  together with head motion horiz/vertical with eyes open + eyes closed, then progressed to compliant foam with eyes open.  Recommended continue without compliant surface at home.  360 degree turns right and left x 5 reps each with stopping after each rep to allow symptoms to settle.  Diona Foley toss for eye/hand coordination.     Exercises   Exercises Other Exercises   Other Exercises  Reviewed prior HEP including lateral step ups and step downs from 6" step.  Patient notes not doing at gym due to no box to use.  PT recommended she use her steps at home and we performed x 10 reps of each exercise with core stabilization.           Vestibular Treatment/Exercise - 09/26/17 1420      Vestibular Treatment/Exercise   Vestibular Treatment Provided Canalith Repositioning;Gaze   Habituation Exercises Brandt Daroff      EPLEY MANUEVER LEFT   Number of Reps  1    RESPONSE DETAILS LEFT No nystagmus viewed after Epley's maneuver today.       Nestor Lewandowsky   Symptom Description  does not notice dizziness when performing at home, PT reviewed 1x today with no dizziness with movements.  d/c'd from HEP.   Tried long sitting with L neck rotation to supine with head extended without nystagmus or dizziness noted.                PT Education - 09/26/17 1509    Education provided Yes   Education Details HEP: updated copy today   Person(s) Educated Patient   Methods Explanation;Demonstration;Handout   Comprehension Returned demonstration;Verbalized understanding          PT Short Term Goals - 09/08/17 1157      PT SHORT TERM GOAL #1   Title Pt will participate in further balance testing: FGA    Time 4   Period Weeks   Status Achieved     PT SHORT TERM GOAL #2   Title Pt will report <2/10 dizziness with quick head turns and bending down to decrease risk for falls with functional activities   Time 4   Period Weeks   Status On-going   Target Date 09/23/17     PT SHORT TERM GOAL #3   Title Pt  will improve gait velocity to > or = 3.8 ft/sec to decrease falls risk in community   Baseline 3.5 ft/sec   Time 4   Period Weeks   Status On-going   Target Date 09/23/17           PT Long Term Goals - 09/08/17 1157  PT LONG TERM GOAL #1   Title Pt will demonstrate independence with balance and vestibular HEP   Time 8   Period Weeks   Status On-going   Target Date 10/23/17     PT LONG TERM GOAL #2   Title Pt will report 0/10 vertigo with positional testing, head turns and bending down   Time 8   Period Weeks   Status On-going   Target Date 10/23/17     PT LONG TERM GOAL #3   Title Pt will demonstrate decreased falls risk as indicated by increase in FGA score by 4 points   Baseline 25/30   Time 8   Period Weeks   Status Revised   Target Date 10/23/17     PT LONG TERM GOAL #5   Title Pt will report increased confidence with balance as indicated by increase in ABC score by 15%   Baseline 31.3%   Time 8   Period Weeks   Status On-going   Target Date 10/23/17               Plan - 09/26/17 1516    Clinical Impression Statement The patient has nystagmus noted on first rep of dix hallpike during session.  She is not noticing spinning sensation with mobility, but rather imbalance.  PT emphasizing dynamic balance progression and general strengthening.   PT Treatment/Interventions ADLs/Self Care Home Management;Canalith Repostioning;Gait training;Stair training;Functional mobility training;Therapeutic activities;Therapeutic exercise;Balance training;Neuromuscular re-education;Patient/family education;Vestibular   PT Next Visit Plan Check STGS; turns, dynamic gait, floor transfers   Consulted and Agree with Plan of Care Patient      Patient will benefit from skilled therapeutic intervention in order to improve the following deficits and impairments:  Decreased balance, Dizziness, Difficulty walking, Pain  Visit Diagnosis: Unsteadiness on feet  Dizziness and  giddiness  BPPV (benign paroxysmal positional vertigo), bilateral     Problem List Patient Active Problem List   Diagnosis Date Noted  . Right lower quadrant abdominal pain 03/18/2017  . Chest pain at rest 11/16/2014  . DM (diabetes mellitus), type 2 with peripheral vascular complications ([place]) 16/09/9603  . Pain in the chest   . Chest pain 11/15/2014  . LLQ abdominal pain 11/05/2014  . Swelling of limb 03/29/2014  . Aftercare following surgery of the circulatory system, [place] 03/29/2014  . Peripheral vascular disease, unspecified ([place]) 03/12/2014  . Aneurysm of abdominal vessel ([place]) 02/19/2014  . Claudication of lower extremity ([place]) 02/08/2014  . Type II or unspecified type diabetes mellitus without mention of complication, uncontrolled 02/08/2014  . Atherosclerosis of renal artery ([place]) 09/04/2013  . Coronary artery disease   . Unstable angina ([place]) 06/09/2013  . Peripheral vascular disease with claudication ([place])   . Abnormal LFTs (liver function tests) 09/19/2012  . Diverticulosis 09/19/2012  . Hypokalemia 03/23/2012  . Benign hypertensive heart disease without heart failure 05/18/2011  . CAD (coronary artery disease) 05/18/2011  . History of abdominal aortic aneurysm repair 05/18/2011  . History of renal artery stenosis 05/18/2011  . Hyperlipidemia     Megan [name], PT 09/26/2017, 3:18 PM  [facility] [address] [place], [place], [address] Phone: [phone]   Fax:  [phone]  Name: Megan [name] MRN: [id number] Date of Birth: 11/27/1946

## 2017-09-29 ENCOUNTER — Ambulatory Visit: Payer: Medicare Other | Admitting: Rehabilitative and Restorative Service Providers"

## 2017-09-29 DIAGNOSIS — R2681 Unsteadiness on feet: Secondary | ICD-10-CM

## 2017-09-29 DIAGNOSIS — R42 Dizziness and giddiness: Secondary | ICD-10-CM

## 2017-09-29 DIAGNOSIS — H8113 Benign paroxysmal vertigo, bilateral: Secondary | ICD-10-CM | POA: Diagnosis not present

## 2017-09-29 NOTE — Therapy (Signed)
[facility] [address] [place], [place], [address] Phone: [phone]   Fax:  [phone]  Physical Therapy Treatment  Patient Details  Name: Megan [name] MRN: [id number] Date of Birth: May 30, 1946 Referring Provider: Penni Bombard, MD  Encounter Date: 09/29/2017      PT End of Session - 09/29/17 1710    Visit Number 8   Number of Visits 17   Date for PT Re-Evaluation 10/23/17   Authorization Type [place] visit PN and G code   PT Start Time 1110   PT Stop Time 1155   PT Time Calculation (min) 45 min   Activity Tolerance Patient tolerated treatment well   Behavior During Therapy [facility] for tasks assessed/performed      Past Medical History:  Diagnosis Date  . AAA (abdominal aortic aneurysm) ([place])    a. s/p stent grafting in 2009.  . Adenomatous colon polyp 05/2001  . Adenomatous duodenal polyp   . Anginal pain ([place])   . COPD (chronic obstructive pulmonary disease) ([place])   . Coronary artery disease    a. s/p CABG in 1995;  b. 05/2013 Neg MV, EF 82%;  c. 06/2013 Cath: LM nl, LAD 40-50p, LCX nl, RCA 80p/125m VG->RCA->PDA->PLSA min irregs, LIMA->LAD atretic, vigorous LV fxn.  . Diabetes mellitus without complication ([place]   . Diverticulosis   . Eczema, dyshidrotic   . Eczema, dyshidrotic 1986   Dr. HMathis Fare . Fibromyalgia   . GERD (gastroesophageal reflux disease)   . Hyperlipidemia   . Hypertension   . Migraine   . Myocardial infarction ([place]   . Osteoarthritis   . Peripheral arterial disease (HCC)    left leg diagnosed by Dr BMare Ferrari . Renal artery stenosis ([place]    a. 2008 s/p PTA    Past Surgical History:  Procedure Laterality Date  . ABDOMINAL AORTAGRAM N/A 03/06/2014   Procedure: ABDOMINAL AORTAGRAM;  Surgeon: TRosetta Posner MD;  [facility];  Service: Cardiovascular;  Laterality: N/A;  . ABDOMINAL AORTIC ANEURYSM REPAIR  2009   stenting  . ABDOMINAL  HYSTERECTOMY     Total, 1992  . CARDIAC CATHETERIZATION    . CARDIOVASCULAR STRESS TEST  11/11/2009   normal study  . CARPAL TUNNEL RELEASE  08/1993   left wrist  . CARPAL TUNNEL RELEASE  01/1997   right  . CORONARY ARTERY BYPASS GRAFT  07/1994   Triple by Dr ECeasar Mons . HAND SURGERY Right 09/2002   Right hand pulley release  . LEFT HEART CATHETERIZATION WITH CORONARY/GRAFT ANGIOGRAM N/A 06/11/2013   Procedure: LEFT HEART CATHETERIZATION WITH CBeatrix Fetters  Surgeon: PThayer Headings MD;  [facility];  Service: Cardiovascular;  Laterality: N/A;  . MULTIPLE TOOTH EXTRACTIONS  2000   All upper teeth.  Has full dneture.   .Marland KitchenRENAL ARTERY STENT  2008  . thyroid cyst apiration  05/1997 and 07/1998  . TONSILECTOMY, ADENOIDECTOMY, BILATERAL MYRINGOTOMY AND TUBES  1989  . TOTAL ABDOMINAL HYSTERECTOMY  1992    There were no vitals filed for this visit.      Subjective Assessment - 09/29/17 1110    Subjective The patient notes dizziness feeling better, however symptoms overall worse in morning (doesn't have glasses on when getting up).  She can't tell if true vertigo, denying room spinning, versus imbalance.    Pertinent History AAA, angina, COPD, CAD s/p CABG, DM, diverticulosis, fibromyalgia, HTN, migraine, MI, OA, PAD, Renal artery stenosis.  back pain   Diagnostic tests MRI-unremarkable   Patient Stated Goals To stop being dizzy when she stands up and bends over   Currently in Pain? No/denies                Vestibular Assessment - 09/29/17 1117      Dix-Hallpike Right   Dix-Hallpike Right Duration 0   Dix-Hallpike Right Symptoms No nystagmus     Dix-Hallpike Left   Dix-Hallpike Left Duration 0   Dix-Hallpike Left Symptoms No nystagmus     Sidelying Right   Sidelying Right Duration 0   Sidelying Right Symptoms No nystagmus     Sidelying Left   Sidelying Left Duration 0   Sidelying Left Symptoms No nystagmus                 [facility] Adult PT  Treatment/Exercise - 09/29/17 1713      Ambulation/Gait   Ambulation/Gait Yes   Ambulation/Gait Assistance 7: Independent   Ambulation Distance (Feet) 600 Feet   Assistive device None   Ambulation Surface Level;Indoor;Outdoor;Paved;Grass;Gravel   Gait velocity 4.06 ft/sec   Gait Comments Dynamic gait activities performing heel walking, toe walking, 360 degree turns, 180 degree turns, starts/stops, direction changes, backwards walking and walking with head motion.  Performed negotiation of rubber mulch, gravel, pinestraw independently without loss of balance.      Self-Care   Self-Care Other Self-Care Comments   Other Self-Care Comments  Discussed home exercise program and continued gym routine.      Therapeutic Activites    Therapeutic Activities Other Therapeutic Activities   Other Therapeutic Activities floor<>stand with UE support on mat table, then using UE support through LEs rising from floor>quadriped>1/2 kneel>standing.       Neuro Re-ed    Neuro Re-ed Details  Reviewed gaze exercises     Exercises   Other Exercises  Tall kneeling with alternating 1/2 kneeling x 5 reps dec'ing UE support         Vestibular Treatment/Exercise - 09/29/17 1119      Vestibular Treatment/Exercise   Vestibular Treatment Provided Habituation;Gaze   Canalith Repositioning --   Habituation Exercises Brandt Daroff;Standing Horizontal Head Turns;Standing Vertical Head Turns;180 degree Turns   Gaze Exercises X1 Viewing Horizontal                 PT Short Term Goals - 09/29/17 1111      PT SHORT TERM GOAL #1   Title Pt will participate in further balance testing: FGA    Time 4   Period Weeks   Status Achieved     PT SHORT TERM GOAL #2   Title Pt will report <2/10 dizziness with quick head turns and bending down to decrease risk for falls with functional activities   Baseline Notes 2/10 with quick head motion or bend to upright.   Time 4   Period Weeks   Status Partially Met      PT SHORT TERM GOAL #3   Title Pt will improve gait velocity to > or = 3.8 ft/sec to decrease falls risk in community   Baseline 4.06 ft/sec   Time 4   Period Weeks   Status Achieved           PT Long Term Goals - 09/08/17 1157      PT LONG TERM GOAL #1   Title Pt will demonstrate independence with balance and vestibular HEP   Time 8   Period Weeks   Status On-going  Target Date 10/23/17     PT LONG TERM GOAL #2   Title Pt will report 0/10 vertigo with positional testing, head turns and bending down   Time 8   Period Weeks   Status On-going   Target Date 10/23/17     PT LONG TERM GOAL #3   Title Pt will demonstrate decreased falls risk as indicated by increase in FGA score by 4 points   Baseline 25/30   Time 8   Period Weeks   Status Revised   Target Date 10/23/17     PT LONG TERM GOAL #5   Title Pt will report increased confidence with balance as indicated by increase in ABC score by 15%   Baseline 31.3%   Time 8   Period Weeks   Status On-going   Target Date 10/23/17               Plan - 09/29/17 1712    Clinical Impression Statement The patient met 2/3 STGs and partially met 1/3 STGs.  She has HEP, has negative positional testing today and could demonstrate independence negotiating outdoor/grassy surfaces, as well as getting up and down from the floor.  PT to reduce frequency to 1x/week and plans to check progress and consider d/c next week if indicated.    PT Treatment/Interventions ADLs/Self Care Home Management;Canalith Repostioning;Gait training;Stair training;Functional mobility training;Therapeutic activities;Therapeutic exercise;Balance training;Neuromuscular re-education;Patient/family education;Vestibular   PT Next Visit Plan Turns, dynamic gait, check HEP, consider D/C    Consulted and Agree with Plan of Care Patient      Patient will benefit from skilled therapeutic intervention in order to improve the following deficits and impairments:   Decreased balance, Dizziness, Difficulty walking, Pain  Visit Diagnosis: Unsteadiness on feet  Dizziness and giddiness     Problem List Patient Active Problem List   Diagnosis Date Noted  . Right lower quadrant abdominal pain 03/18/2017  . Chest pain at rest 11/16/2014  . DM (diabetes mellitus), type 2 with peripheral vascular complications ([place]) 63/89/3734  . Pain in the chest   . Chest pain 11/15/2014  . LLQ abdominal pain 11/05/2014  . Swelling of limb 03/29/2014  . Aftercare following surgery of the circulatory system, [place] 03/29/2014  . Peripheral vascular disease, unspecified ([place]) 03/12/2014  . Aneurysm of abdominal vessel ([place]) 02/19/2014  . Claudication of lower extremity ([place]) 02/08/2014  . Type II or unspecified type diabetes mellitus without mention of complication, uncontrolled 02/08/2014  . Atherosclerosis of renal artery ([place]) 09/04/2013  . Coronary artery disease   . Unstable angina ([place]) 06/09/2013  . Peripheral vascular disease with claudication ([place])   . Abnormal LFTs (liver function tests) 09/19/2012  . Diverticulosis 09/19/2012  . Hypokalemia 03/23/2012  . Benign hypertensive heart disease without heart failure 05/18/2011  . CAD (coronary artery disease) 05/18/2011  . History of abdominal aortic aneurysm repair 05/18/2011  . History of renal artery stenosis 05/18/2011  . Hyperlipidemia     Artemisa Sladek, PT 09/29/2017, 5:26 PM  [facility] [address] [place], [place], [address] Phone: [phone]   Fax:  [phone]  Name: Megan [name] MRN: [id number] Date of Birth: [birth year]

## 2017-10-03 ENCOUNTER — Encounter: Payer: Medicare Other | Admitting: Rehabilitative and Restorative Service Providers"

## 2017-10-04 ENCOUNTER — Other Ambulatory Visit: Payer: Self-pay

## 2017-10-04 MED ORDER — OMEPRAZOLE 40 MG PO CPDR: DELAYED_RELEASE_CAPSULE | ORAL | 1 refills | Status: DC

## 2017-10-06 ENCOUNTER — Ambulatory Visit
Payer: Medicare Other | Attending: Diagnostic Neuroimaging | Admitting: Rehabilitative and Restorative Service Providers"

## 2017-10-06 DIAGNOSIS — R42 Dizziness and giddiness: Secondary | ICD-10-CM | POA: Diagnosis present

## 2017-10-06 DIAGNOSIS — R2681 Unsteadiness on feet: Secondary | ICD-10-CM

## 2017-10-06 DIAGNOSIS — H8113 Benign paroxysmal vertigo, bilateral: Secondary | ICD-10-CM | POA: Diagnosis present

## 2017-10-06 NOTE — Therapy (Signed)
[facility] [address] [place], [place], [address] Phone: [phone]   Fax:  [phone]  Physical Therapy Treatment  Patient Details  Name: Megan [name] MRN: [id number] Date of Birth: Dec 03, 1946 Referring Provider: Penni Bombard, MD  Encounter Date: 10/06/2017      PT End of Session - 10/06/17 1112    Visit Number 9   Number of Visits 17   Date for PT Re-Evaluation 10/23/17   Authorization Type [place] visit PN and G code   PT Start Time 1110   PT Stop Time 1150   PT Time Calculation (min) 40 min   Activity Tolerance Patient tolerated treatment well   Behavior During Therapy [facility] for tasks assessed/performed      Past Medical History:  Diagnosis Date  . AAA (abdominal aortic aneurysm) ([place])    a. s/p stent grafting in 2009.  . Adenomatous colon polyp 05/2001  . Adenomatous duodenal polyp   . Anginal pain ([place])   . COPD (chronic obstructive pulmonary disease) ([place])   . Coronary artery disease    a. s/p CABG in 1995;  b. 05/2013 Neg MV, EF 82%;  c. 06/2013 Cath: LM nl, LAD 40-50p, LCX nl, RCA 80p/195m VG->RCA->PDA->PLSA min irregs, LIMA->LAD atretic, vigorous LV fxn.  . Diabetes mellitus without complication ([place]   . Diverticulosis   . Eczema, dyshidrotic   . Eczema, dyshidrotic 1986   Dr. HMathis Fare . Fibromyalgia   . GERD (gastroesophageal reflux disease)   . Hyperlipidemia   . Hypertension   . Migraine   . Myocardial infarction ([place]   . Osteoarthritis   . Peripheral arterial disease (HCC)    left leg diagnosed by Dr BMare Ferrari . Renal artery stenosis ([place]    a. 2008 s/p PTA    Past Surgical History:  Procedure Laterality Date  . ABDOMINAL AORTAGRAM N/A 03/06/2014   Procedure: ABDOMINAL AORTAGRAM;  Surgeon: TRosetta Posner MD;  [facility];  Service: Cardiovascular;  Laterality: N/A;  . ABDOMINAL AORTIC ANEURYSM REPAIR  2009   stenting  . ABDOMINAL  HYSTERECTOMY     Total, 1992  . CARDIAC CATHETERIZATION    . CARDIOVASCULAR STRESS TEST  11/11/2009   normal study  . CARPAL TUNNEL RELEASE  08/1993   left wrist  . CARPAL TUNNEL RELEASE  01/1997   right  . CORONARY ARTERY BYPASS GRAFT  07/1994   Triple by Dr ECeasar Mons . HAND SURGERY Right 09/2002   Right hand pulley release  . LEFT HEART CATHETERIZATION WITH CORONARY/GRAFT ANGIOGRAM N/A 06/11/2013   Procedure: LEFT HEART CATHETERIZATION WITH CBeatrix Fetters  Surgeon: PThayer Headings MD;  [facility];  Service: Cardiovascular;  Laterality: N/A;  . MULTIPLE TOOTH EXTRACTIONS  2000   All upper teeth.  Has full dneture.   .Marland KitchenRENAL ARTERY STENT  2008  . thyroid cyst apiration  05/1997 and 07/1998  . TONSILECTOMY, ADENOIDECTOMY, BILATERAL MYRINGOTOMY AND TUBES  1989  . TOTAL ABDOMINAL HYSTERECTOMY  1992    There were no vitals filed for this visit.      Subjective Assessment - 10/06/17 1111    Subjective The patient notes that she had dizziness yesterday when looking down further described as staggering, drunk sensation.     Pertinent History AAA, angina, COPD, CAD s/p CABG, DM, diverticulosis, fibromyalgia, HTN, migraine, MI, OA, PAD, Renal artery stenosis. back pain   Patient Stated Goals To stop being dizzy when she  stands up and bends over   Currently in Pain? Yes  Back "always hurts"- PT to monitor but no goal to follow            [facility] PT Assessment - 10/06/17 1126      Functional Gait  Assessment   Gait assessed  Yes   Gait Level Surface Walks 20 ft in less than 5.5 sec, no assistive devices, good speed, no evidence for imbalance, normal gait pattern, deviates no more than 6 in outside of the 12 in walkway width.   Change in Gait Speed Able to smoothly change walking speed without loss of balance or gait deviation. Deviate no more than 6 in outside of the 12 in walkway width.   Gait with Horizontal Head Turns Performs head turns smoothly with no change in  gait. Deviates no more than 6 in outside 12 in walkway width   Gait with Vertical Head Turns Performs head turns with no change in gait. Deviates no more than 6 in outside 12 in walkway width.   Gait and Pivot Turn Pivot turns safely within 3 sec and stops quickly with no loss of balance.   Step Over Obstacle Is able to step over 2 stacked shoe boxes taped together (9 in total height) without changing gait speed. No evidence of imbalance.   Gait with Narrow Base of Support Is able to ambulate for 10 steps heel to toe with no staggering.   Gait with Eyes Closed Walks 20 ft, no assistive devices, good speed, no evidence of imbalance, normal gait pattern, deviates no more than 6 in outside 12 in walkway width. Ambulates 20 ft in less than 7 sec.   Ambulating Backwards Walks 20 ft, no assistive devices, good speed, no evidence for imbalance, normal gait   Steps Alternating feet, no rail.   Total Score 30   FGA comment: improved from 25/30 up to 30/30            Vestibular Assessment - 10/06/17 1113      Positional Testing   Dix-Hallpike --   Sidelying Test Sidelying Right;Sidelying Left   Horizontal Canal Testing Horizontal Canal Right;Horizontal Canal Left     Dix-Hallpike Right   Dix-Hallpike Right Duration Performed after sidelying test and did not have nystagmus viewed in room light--? fatigued symptoms   Dix-Hallpike Right Symptoms No nystagmus     Sidelying Right   Sidelying Right Duration 8 seconds   Sidelying Right Symptoms Upbeat, right rotatory nystagmus  low amplitude     Sidelying Left   Sidelying Left Duration 0   Sidelying Left Symptoms No nystagmus     Horizontal Canal Right   Horizontal Canal Right Duration 0   Horizontal Canal Right Symptoms Normal     Horizontal Canal Left   Horizontal Canal Left Duration 0   Horizontal Canal Left Symptoms Normal                 [facility] Adult PT Treatment/Exercise - 10/06/17 1122      Neuro Re-ed    Neuro Re-ed  Details  Turns with standing doing figure 8 walking x 5 reps and then 360 degree turns alternating directions and adding alternating foot taps to cones.           Vestibular Treatment/Exercise - 10/06/17 1120      Vestibular Treatment/Exercise   Vestibular Treatment Provided Habituation   Habituation Exercises [place]   Number of Reps  2  Symptom Description  discussed continuing for self mgmt.                PT Education - 10/06/17 1503    Education provided Yes   Education Details Reviewed mgmt of chronic/intermittent vertigo   Person(s) Educated Patient   Methods Explanation   Comprehension Verbalized understanding          PT Short Term Goals - 09/29/17 1111      PT SHORT TERM GOAL #1   Title Pt will participate in further balance testing: FGA    Time 4   Period Weeks   Status Achieved     PT SHORT TERM GOAL #2   Title Pt will report <2/10 dizziness with quick head turns and bending down to decrease risk for falls with functional activities   Baseline Notes 2/10 with quick head motion or bend to upright.   Time 4   Period Weeks   Status Partially Met     PT SHORT TERM GOAL #3   Title Pt will improve gait velocity to > or = 3.8 ft/sec to decrease falls risk in community   Baseline 4.06 ft/sec   Time 4   Period Weeks   Status Achieved           PT Long Term Goals - 10/06/17 1133      PT LONG TERM GOAL #1   Title Pt will demonstrate independence with balance and vestibular HEP   Baseline Has comprehensive home program.   Time 8   Period Weeks   Status Achieved     PT LONG TERM GOAL #2   Title Pt will report 0/10 vertigo with positional testing, head turns and bending down   Baseline significantly improved vertigo, but has variable days and intermittent symptoms.    Time 8   Period Weeks   Status Partially Met     PT LONG TERM GOAL #3   Title Pt will demonstrate decreased falls risk as indicated by increase in FGA  score by 4 points   Baseline 25/30 improved to 30/30 10/06/2017   Time 8   Period Weeks   Status Achieved     PT LONG TERM GOAL #5   Title Pt will report increased confidence with balance as indicated by increase in ABC score by 15%   Baseline Improved 40% since evaluation.   Time 8   Period Weeks   Status Achieved               Plan - 10/06/17 1504    Clinical Impression Statement The patient has R posterior canalithiasis present today.  She noted a return of symptoms yesterday and began brandt daroff habituation to treat.  Her symptoms clear quickly today with habituation and PT recommended she manage chronic, intermittent symptoms using habituation for home program.  She is doing well from a balance, mobility standpoint and is not at a high risk for falls.  The patient is walking more on unlevel ground and increasing her activity.  PT recommends continue with self mgmt of vertigo as needed and continue with wellness program + high level balance activities.     PT Treatment/Interventions ADLs/Self Care Home Management;Canalith Repostioning;Gait training;Stair training;Functional mobility training;Therapeutic activities;Therapeutic exercise;Balance training;Neuromuscular re-education;Patient/family education;Vestibular   PT Next Visit Plan Plan to f/u by phone in 2 weeks and d/c.   Consulted and Agree with Plan of Care Patient      Patient will benefit from skilled therapeutic intervention in order to improve  the following deficits and impairments:  Decreased balance, Dizziness, Difficulty walking, Pain  Visit Diagnosis: Unsteadiness on feet  Dizziness and giddiness  BPPV (benign paroxysmal positional vertigo), bilateral     Problem List Patient Active Problem List   Diagnosis Date Noted  . Right lower quadrant abdominal pain 03/18/2017  . Chest pain at rest 11/16/2014  . DM (diabetes mellitus), type 2 with peripheral vascular complications ([place]) 01/75/1025  . Pain in  the chest   . Chest pain 11/15/2014  . LLQ abdominal pain 11/05/2014  . Swelling of limb 03/29/2014  . Aftercare following surgery of the circulatory system, [place] 03/29/2014  . Peripheral vascular disease, unspecified ([place]) 03/12/2014  . Aneurysm of abdominal vessel ([place]) 02/19/2014  . Claudication of lower extremity ([place]) 02/08/2014  . Type II or unspecified type diabetes mellitus without mention of complication, uncontrolled 02/08/2014  . Atherosclerosis of renal artery ([place]) 09/04/2013  . Coronary artery disease   . Unstable angina ([place]) 06/09/2013  . Peripheral vascular disease with claudication ([place])   . Abnormal LFTs (liver function tests) 09/19/2012  . Diverticulosis 09/19/2012  . Hypokalemia 03/23/2012  . Benign hypertensive heart disease without heart failure 05/18/2011  . CAD (coronary artery disease) 05/18/2011  . History of abdominal aortic aneurysm repair 05/18/2011  . History of renal artery stenosis 05/18/2011  . Hyperlipidemia     Malori Myers, PT 10/06/2017, 3:07 PM  [facility] [address] [place], [place], [address] Phone: [phone]   Fax:  [phone]  Name: Megan [name] MRN: [id number] Date of Birth: August 22, 1946

## 2017-10-10 ENCOUNTER — Encounter: Payer: Medicare Other | Admitting: Rehabilitative and Restorative Service Providers"

## 2017-10-13 ENCOUNTER — Other Ambulatory Visit: Payer: Self-pay

## 2017-10-13 ENCOUNTER — Ambulatory Visit: Payer: Medicare Other | Admitting: Physician Assistant

## 2017-10-13 ENCOUNTER — Encounter: Payer: Self-pay | Admitting: Physician Assistant

## 2017-10-13 ENCOUNTER — Encounter: Payer: Medicare Other | Admitting: Rehabilitative and Restorative Service Providers"

## 2017-10-13 VITALS — BP 130/70 | HR 87 | Temp 99.0°F | Resp 14 | Ht 60.0 in | Wt 153.0 lb

## 2017-10-13 DIAGNOSIS — B9689 Other specified bacterial agents as the cause of diseases classified elsewhere: Secondary | ICD-10-CM | POA: Diagnosis not present

## 2017-10-13 DIAGNOSIS — J208 Acute bronchitis due to other specified organisms: Secondary | ICD-10-CM | POA: Diagnosis not present

## 2017-10-13 MED ORDER — HYDROCODONE-HOMATROPINE 5-1.5 MG/5ML PO SYRP: 5.0000 mL | ORAL_SOLUTION | Freq: Three times a day (TID) | ORAL | 0 refills | Status: DC | PRN

## 2017-10-13 MED ORDER — AZITHROMYCIN 250 MG PO TABS: ORAL_TABLET | ORAL | 0 refills | Status: DC

## 2017-10-13 NOTE — Progress Notes (Signed)
Patient with history of COPD presents to clinic today c/o URI symptoms starting Monday. Endorses sore throat with ear pressure, swollen glands and cough that has now become productive of brown sputum. Has noted some chest tenderness but denies overt . Denies recent travel or sick contact. Patient has taken Mucinex for symptoms relief. Has noted fever starting yesterday.   Past Medical History:  Diagnosis Date  . AAA (abdominal aortic aneurysm) ([place])    a. s/p stent grafting in 2009.  . Adenomatous colon polyp 05/2001  . Adenomatous duodenal polyp   . Anginal pain ([place])   . COPD (chronic obstructive pulmonary disease) ([place])   . Coronary artery disease    a. s/p CABG in 1995;  b. 05/2013 Neg MV, EF 82%;  c. 06/2013 Cath: LM nl, LAD 40-50p, LCX nl, RCA 80p/176m, VG->RCA->PDA->PLSA min irregs, LIMA->LAD atretic, vigorous LV fxn.  . Diabetes mellitus without complication ([place])   . Diverticulosis   . Eczema, dyshidrotic   . Eczema, dyshidrotic 1986   Dr. Mathis Fare  . Fibromyalgia   . GERD (gastroesophageal reflux disease)   . Hyperlipidemia   . Hypertension   . Migraine   . Myocardial infarction ([place])   . Osteoarthritis   . Peripheral arterial disease ([facility])    left leg diagnosed by Dr Mare Ferrari  . Renal artery stenosis ([place])    a. 2008 s/p PTA    Current Outpatient Medications on File Prior to Visit  Medication Sig Dispense Refill  . ACCU-CHEK FASTCLIX LANCETS MISC   6  . ALPRAZolam (XANAX) 0.25 MG tablet Take 1 tablet (0.25 mg total) by mouth at bedtime as needed. 90 tablet 1  . amLODipine (NORVASC) 5 MG tablet Take 5 mg by mouth daily.    [name] aspirin 325 MG tablet Take 325 mg by mouth daily.    [name] atorvastatin (LIPITOR) 40 MG tablet TAKE 1 TABLET (40 MG TOTAL) BY MOUTH DAILY AT 6 PM. 90 tablet 2  . augmented betamethasone dipropionate (DIPROLENE-AF) 0.05 % cream Apply 1 application topically daily as needed. For hands  1  . ciclopirox (LOPROX) 0.77 % cream Apply topically as needed.     . clindamycin (CLEOCIN T) 1 % lotion Apply topically as needed.    . clobetasol cream (TEMOVATE) 3.71 % Apply 1 application topically 2 (two) times daily as needed.    . cycloSPORINE (RESTASIS) 0.05 % ophthalmic emulsion Place 1 drop into both eyes at bedtime.    [name] estradiol (ESTRACE) 1 MG tablet Take 1 mg by mouth daily.    . hydrALAZINE (APRESOLINE) 10 MG tablet Take 1 tablet (10 mg total) by mouth 3 (three) times daily. 270 tablet 3  . KLOR-CON 10 10 MEQ tablet TAKE 2 TABLET BY MOUTH TWICE A DAY 120 tablet 6  . loratadine (CLARITIN) 10 MG tablet Take 10 mg by mouth daily.    [name] losartan-hydrochlorothiazide (HYZAAR) 100-25 MG tablet TAKE 1 TABLET BY MOUTH DAILY. 90 tablet 3  . Multiple Vitamin (MULTIVITAMIN) tablet Take 1 tablet by mouth daily.    . nitroGLYCERIN (NITROSTAT) 0.4 MG SL tablet Place 1 tablet (0.4 mg total) under the tongue every 5 (five) minutes as needed for chest pain. 25 tablet PRN  . Omega-3 Fatty Acids (FISH OIL) 1000 MG CAPS Take 2,000 mg by mouth 2 (two) times daily.    [name] omeprazole (PRILOSEC) 40 MG capsule TAKE 1 CAPSULE (40 MG TOTAL) BY MOUTH 2 (TWO) TIMES DAILY. 180 capsule 1  . tretinoin (RETIN-A) 0.1 %  cream Apply 1 application topically every other day.    . Vitamin D, Ergocalciferol, 2000 units CAPS Take by mouth.     No current facility-administered medications on file prior to visit.     Allergies  Allergen Reactions  . Anticoagulant Compound Itching and Other (See Comments)    Hands and feet tingle and itch. Hands and feet tingle and itch.  . Morphine And Related Nausea Only and Itching  . Bextra [Valdecoxib] Other (See Comments)    Increased lft's Increased lft's  . Hydrocod Polst-Cpm Polst Er Rash  . Lipitor [Atorvastatin] Other (See Comments)    Leg weakness Leg weakness  . Mevacor [Lovastatin] Other (See Comments)    Muscle weakness Muscle weakness  . Plavix  [Clopidogrel Bisulfate] Itching and Other (See Comments)    Hands and feet tingle  and itch.  . Zocor [Simvastatin] Other (See Comments)    Bones hurt Bones hurt   . Doxycycline Diarrhea    N/v/d  . Metoprolol Other (See Comments)    Hair loss  . Plavix [Clopidogrel Bisulfate] Other (See Comments)    Itching of hands and feet  . Codeine Itching and Rash    Hyper-active  . Hydrocod Polst-Cpm Polst Er Rash  . Lisinopril Cough    Family History  Problem Relation Age of Onset  . Heart disease Mother   . Heart attack Mother   . Heart disease Father        before age 3  . Heart attack Father   . Heart disease Sister        before age 10  . Diabetes Sister   . Hyperlipidemia Sister   . Hypertension Sister   . Heart attack Sister   . Heart disease Brother   . Diabetes Brother   . Hyperlipidemia Brother   . Hypertension Brother   . Heart attack Brother   . AAA (abdominal aortic aneurysm) Brother   . Colon cancer Paternal Grandmother   . Liver cancer Sister   . Pancreatic cancer Neg Hx   . Rectal cancer Neg Hx   . Stomach cancer Neg Hx   . Esophageal cancer Neg Hx     Social History   Socioeconomic History  . Marital status: Married    Spouse name: None  . Number of children: None  . Years of education: None  . Highest education level: None  Social Needs  . Financial resource strain: None  . Food insecurity - worry: None  . Food insecurity - inability: None  . Transportation needs - medical: None  . Transportation needs - non-medical: None  Occupational History  . None  Tobacco Use  . Smoking status: Former Smoker    Types: Cigarettes    Last attempt to quit: 12/07/1995    Years since quitting: 21.8  . Smokeless tobacco: Never Used  Substance and Sexual Activity  . Alcohol use: Yes    Alcohol/week: 3.6 - 6.0 oz    Types: 6 - 10 Glasses of wine per week    Comment: 2-3 glasses daily  . Drug use: No  . Sexual activity: Not Currently  Other Topics Concern  . None  Social History Narrative   Lives at home with husband.  Is retired.   Education 4 yrs of college.  No children.   Caffiene 2 cups daily.     Review of Systems - See HPI.  All other ROS are negative.  BP 130/70   Pulse 87   Temp 99  F (37.2 C) (Oral)   Resp 14   Ht 5' (1.524 m)   Wt 153 lb (69.4 kg)   SpO2 97%   BMI 29.88 kg/m   Physical Exam  Constitutional: She is oriented to person, place, and time and well-developed, well-nourished, and in no distress.  HENT:  Head: Normocephalic and atraumatic.  Eyes: Conjunctivae are normal.  Neck: Neck supple.  Cardiovascular: Normal rate, regular rhythm, normal heart sounds and intact distal pulses.  Pulmonary/Chest: Effort normal and breath sounds normal. No respiratory distress. She has no wheezes. She has no rales. She exhibits no tenderness.  Lymphadenopathy:    She has no cervical adenopathy.  Neurological: She is alert and oriented to person, place, and time.  Skin: Skin is warm and dry. No rash noted.  Psychiatric: Affect normal.  Vitals reviewed.  Assessment/Plan: 1. Acute bacterial bronchitis Patient with history of COPD.  Fever and productive cough.  Lungs clear to auscultation bilaterally.  Rx azithromycin.  Continue plain mucinex. Discussed cough medication. States OTC meds and tessalon are ineffective. Usually takes Rx syrup with codeine as she tolerates well despite itch with tablet codeine. Rx given.  Return precautions discussed with patient.   Leeanne Rio, PA-C

## 2017-10-13 NOTE — Patient Instructions (Signed)
Take antibiotic (Azithromycin) as directed.  Increase fluids.  Get plenty of rest. Use Mucinex for congestion. Take a daily probiotic (I recommend Align or Culturelle, but even Activia Yogurt may be beneficial).  A humidifier placed in the bedroom may offer some relief for a dry, scratchy throat of nasal irritation.  Read information below on acute bronchitis. Please call or return to clinic if symptoms are not improving.  Acute Bronchitis Bronchitis is when the airways that extend from the windpipe into the lungs get red, puffy, and painful (inflamed). Bronchitis often causes thick spit (mucus) to develop. This leads to a cough. A cough is the most common symptom of bronchitis. In acute bronchitis, the condition usually begins suddenly and goes away over time (usually in 2 weeks). Smoking, allergies, and asthma can make bronchitis worse. Repeated episodes of bronchitis may cause more lung problems.  HOME CARE  Rest.  Drink enough fluids to keep your pee (urine) clear or pale yellow (unless you need to limit fluids as told by your doctor).  Only take over-the-counter or prescription medicines as told by your doctor.  Avoid smoking and secondhand smoke. These can make bronchitis worse. If you are a smoker, think about using nicotine gum or skin patches. Quitting smoking will help your lungs heal faster.  Reduce the chance of getting bronchitis again by:  Washing your hands often.  Avoiding people with cold symptoms.  Trying not to touch your hands to your mouth, nose, or eyes.  Follow up with your doctor as told.  GET HELP IF: Your symptoms do not improve after 1 week of treatment. Symptoms include:  Cough.  Fever.  Coughing up thick spit.  Body aches.  Chest congestion.  Chills.  Shortness of breath.  Sore throat.  GET HELP RIGHT AWAY IF:   You have an increased fever.  You have chills.  You have severe shortness of breath.  You have bloody thick spit  (sputum).  You throw up (vomit) often.  You lose too much body fluid (dehydration).  You have a severe headache.  You faint.  MAKE SURE YOU:   Understand these instructions.  Will watch your condition.  Will get help right away if you are not doing well or get worse. Document Released: 05/10/2008 Document Revised: 07/25/2013 Document Reviewed: 05/15/2013 ExitCare Patient Information 2015 [employer]. This information is not intended to replace advice given to you by your health care provider. Make sure you discuss any questions you have with your health care provider.   

## 2017-10-13 NOTE — Progress Notes (Signed)
Pre visit review using our clinic review tool, if applicable. No additional management support is needed unless otherwise documented below in the visit note. 

## 2017-10-14 ENCOUNTER — Telehealth: Payer: Self-pay | Admitting: Family Medicine

## 2017-10-14 NOTE — Telephone Encounter (Signed)
Pt states that she came in and saw [place] on 11/8, pt was given an abx , pt states that today she as been running a fever of 99 to 100 and wanted to make [place] aware. I advise pt to continue abx and to call us on Monday if she was not feeling any better.

## 2017-10-14 NOTE — Telephone Encounter (Signed)
Agree with plan. Antibiotics have not been in system for 24 hours yet.

## 2017-10-17 ENCOUNTER — Telehealth: Payer: Self-pay | Admitting: Family Medicine

## 2017-10-17 ENCOUNTER — Encounter: Payer: Medicare Other | Admitting: Rehabilitative and Restorative Service Providers"

## 2017-10-17 DIAGNOSIS — R059 Cough, unspecified: Secondary | ICD-10-CM

## 2017-10-17 DIAGNOSIS — R05 Cough: Secondary | ICD-10-CM

## 2017-10-17 MED ORDER — BENZONATATE 100 MG PO CAPS: 100.0000 mg | ORAL_CAPSULE | Freq: Three times a day (TID) | ORAL | 0 refills | Status: DC | PRN

## 2017-10-17 NOTE — Telephone Encounter (Signed)
Pt states that she is feeling better from visit she had with Einar Pheasant last thurs, however her cough has gotten worse and asking if something else could be called in for her, she states what she got is not working for her. Also pt asking if she would need to come back in to have her lungs checked, please advise.

## 2017-10-17 NOTE — Telephone Encounter (Signed)
Spoke with patient about current symptoms. She is still having the cough, persistent, not sleeping at night. Had not had a low grade fever over the weekend. Taking Mucinex. Having some wheezing and coughing spells/spasms. She finished Azithromycin today. Is taking the Hycodan cough syrup but is not helping. Any other options.

## 2017-10-17 NOTE — Addendum Note (Signed)
Addended by: Leonidas Romberg on: 10/17/2017 04:02 PM   Modules accepted: Orders

## 2017-10-17 NOTE — Telephone Encounter (Signed)
Unfortunately not many other safe Rx options giving she states none of the others are therapeutic for her. Would have her start Delsym over the counter and add on tessalon perles to see if the combo is more effective.  The z-pack will remain in her system for several more days. Would recommend holding off on additional antibiotic to give the azithromycin even more time to help.

## 2017-10-17 NOTE — Telephone Encounter (Signed)
Spoke with patient advised of recommendations. Patient is agreeable with adding the Tessalon pearls with the Hycodan cough syrup. Rx sent to the pharmacy for the cough.

## 2017-10-20 ENCOUNTER — Encounter: Payer: Medicare Other | Admitting: Physical Therapy

## 2017-10-21 ENCOUNTER — Other Ambulatory Visit: Payer: Self-pay | Admitting: *Deleted

## 2017-10-21 MED ORDER — AMLODIPINE BESYLATE 5 MG PO TABS: 5.0000 mg | ORAL_TABLET | Freq: Every day | ORAL | 2 refills | Status: DC

## 2017-10-24 ENCOUNTER — Encounter: Payer: Medicare Other | Admitting: Rehabilitative and Restorative Service Providers"

## 2017-10-26 ENCOUNTER — Encounter: Payer: Medicare Other | Admitting: Physical Therapy

## 2017-11-01 ENCOUNTER — Other Ambulatory Visit: Payer: Self-pay | Admitting: *Deleted

## 2017-11-01 NOTE — Telephone Encounter (Signed)
Received 90 day refill request on topiramate.  She is not taking the drug and does not need it.  She states she never really took it as was not having headaches.  She cancelled her appt for 11-01-17 since she is doing much better.  Will call back as needed.  She will call [facility] to stop sending request.  I relayed that glad she was better.

## 2017-11-09 ENCOUNTER — Ambulatory Visit: Payer: Medicare Other | Admitting: Diagnostic Neuroimaging

## 2017-11-09 ENCOUNTER — Encounter: Payer: Self-pay | Admitting: Family Medicine

## 2017-11-10 ENCOUNTER — Ambulatory Visit (INDEPENDENT_AMBULATORY_CARE_PROVIDER_SITE_OTHER): Payer: Medicare Other | Admitting: Family Medicine

## 2017-11-10 ENCOUNTER — Encounter: Payer: Self-pay | Admitting: Family Medicine

## 2017-11-10 VITALS — BP 126/60 | HR 79 | Temp 98.1°F | Ht 60.0 in | Wt 151.0 lb

## 2017-11-10 DIAGNOSIS — M8580 Other specified disorders of bone density and structure, unspecified site: Secondary | ICD-10-CM | POA: Insufficient documentation

## 2017-11-10 DIAGNOSIS — M47817 Spondylosis without myelopathy or radiculopathy, lumbosacral region: Secondary | ICD-10-CM

## 2017-11-10 DIAGNOSIS — I739 Peripheral vascular disease, unspecified: Secondary | ICD-10-CM | POA: Diagnosis not present

## 2017-11-10 DIAGNOSIS — M5137 Other intervertebral disc degeneration, lumbosacral region: Secondary | ICD-10-CM | POA: Diagnosis not present

## 2017-11-10 DIAGNOSIS — K760 Fatty (change of) liver, not elsewhere classified: Secondary | ICD-10-CM

## 2017-11-10 DIAGNOSIS — L301 Dyshidrosis [pompholyx]: Secondary | ICD-10-CM | POA: Insufficient documentation

## 2017-11-10 DIAGNOSIS — D132 Benign neoplasm of duodenum: Secondary | ICD-10-CM | POA: Insufficient documentation

## 2017-11-10 DIAGNOSIS — Z79890 Hormone replacement therapy (postmenopausal): Secondary | ICD-10-CM | POA: Diagnosis not present

## 2017-11-10 DIAGNOSIS — M797 Fibromyalgia: Secondary | ICD-10-CM | POA: Insufficient documentation

## 2017-11-10 DIAGNOSIS — I10 Essential (primary) hypertension: Secondary | ICD-10-CM | POA: Diagnosis not present

## 2017-11-10 DIAGNOSIS — G4700 Insomnia, unspecified: Secondary | ICD-10-CM | POA: Insufficient documentation

## 2017-11-10 DIAGNOSIS — R7301 Impaired fasting glucose: Secondary | ICD-10-CM | POA: Diagnosis not present

## 2017-11-10 DIAGNOSIS — Z974 Presence of external hearing-aid: Secondary | ICD-10-CM | POA: Insufficient documentation

## 2017-11-10 DIAGNOSIS — K219 Gastro-esophageal reflux disease without esophagitis: Secondary | ICD-10-CM | POA: Insufficient documentation

## 2017-11-10 MED ORDER — ESTRADIOL 0.5 MG PO TABS: 0.5000 mg | ORAL_TABLET | Freq: Every day | ORAL | 3 refills | Status: DC

## 2017-11-10 NOTE — Progress Notes (Signed)
Subjective  CC:  Chief Complaint  Patient presents with  . Establish Care    Transfer from [place]  . PAD    HPI: Megan [name] is a 71 y.o. female who presents to [place] at [facility] today to establish care with me as a new patient. Former patient at [facility]. She has the following concerns or needs:   Vascular disease: PAD - doing well w/o claudication; CAD and HTN - all well controlled. Follows with cards.   Lipids - doing well on statin. Due for labs next month. Monitoring fatty liver and elevated lfts on statin. Have been stable. No myalgias. Intractable headache: reviewed records - suffered with several months of headache malaise over summer after tick bite. / viral etiology; negative lymes work up. Now better but with thinning hair. I've personally reviewed recent office visit notes, hospital notes, associated labs and imaging reports and/or pertinent outside office records via chart review or CareEverywhere.  HRT- postmenopausal hot flushes on 1mg  estradiol - this was increased last vist but pt not noting significant improvement over 0.5mg  dosing; tolerating mild sxs. HM: up to date.  We updated and reviewed the patient's past history in detail and it is documented below.  Patient Active Problem List   Diagnosis Date Noted  . IFG (impaired fasting glucose) 11/10/2017    Priority: High  . 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 Coronary artery disease     Priority: High  . Peripheral vascular disease with claudication ([place])     Priority: High  . Benign hypertensive heart disease without heart failure 05/18/2011    Priority: High  . History of abdominal aortic aneurysm repair 05/18/2011    Priority: High  . Mixed hyperlipidemia     Priority: High  . DJD (degenerative joint disease), lumbosacral 11/10/2017    Priority: Medium  . Fibromyalgia 11/10/2017    Priority: Medium  . GERD (gastroesophageal reflux disease) 11/10/2017    Priority:  Medium  . Hormone replacement therapy (HRT) 11/10/2017    Priority: Medium  . Fatty liver 11/10/2017    Priority: Medium  . Diverticulosis 09/19/2012    Priority: Medium  . History of renal artery stenosis 05/18/2011    Priority: Medium  . Dyshidrotic eczema 11/10/2017    Priority: Low  . Osteopenia 11/10/2017    Priority: Low  . Insomnia 11/10/2017    Priority: Low  . Does use hearing aid 11/10/2017    Priority: Low    Health Maintenance  Topic Date Due  . MAMMOGRAM  08/22/2018  . COLONOSCOPY  09/20/2019  . TETANUS/TDAP  08/09/2022  . INFLUENZA VACCINE  Completed  . DEXA SCAN  Completed  . Hepatitis C Screening  Completed  . PNA vac Low Risk Adult  Completed   Immunization History  Administered Date(s) Administered  . Influenza, Seasonal, Injecte, Preservative Fre 09/21/2014, 09/06/2015, 07/21/2016  . Influenza-Unspecified 08/10/2017  . PPD Test 09/13/1979  . Pneumococcal Conjugate-13 12/31/2016  . Pneumococcal Polysaccharide-23 12/16/2014  . Td 05/13/2008  . Tdap 08/09/2012  . Zoster 11/03/2010   Current Meds  Medication Sig  . ACCU-CHEK FASTCLIX LANCETS MISC   . ALPRAZolam (XANAX) 0.25 MG tablet Take 1 tablet (0.25 mg total) by mouth at bedtime as needed.  [name] amLODipine (NORVASC) 5 MG tablet Take 1 tablet (5 mg total) daily by mouth.  [name] aspirin 325 MG tablet Take 325 mg by mouth daily.  [name] atorvastatin (LIPITOR) 40 MG tablet TAKE 1 TABLET (40 MG  TOTAL) BY MOUTH DAILY AT 6 PM.  . augmented betamethasone dipropionate (DIPROLENE-AF) 0.05 % cream Apply 1 application topically daily as needed. For hands  . ciclopirox (LOPROX) 0.77 % cream Apply topically as needed.  . clindamycin (CLEOCIN T) 1 % lotion Apply topically as needed.  . clobetasol cream (TEMOVATE) 9.70 % Apply 1 application topically 2 (two) times daily as needed.  . cycloSPORINE (RESTASIS) 0.05 % ophthalmic emulsion Place 1 drop into both eyes at bedtime.  [name] estradiol (ESTRACE) 0.5 MG tablet Take 1 tablet  (0.5 mg total) by mouth daily.  . hydrALAZINE (APRESOLINE) 10 MG tablet Take 1 tablet (10 mg total) by mouth 3 (three) times daily.  [name] HYDROcodone-homatropine (HYCODAN) 5-1.5 MG/5ML syrup Take 5 mLs every 8 (eight) hours as needed by mouth for cough.  [name] KLOR-CON 10 10 MEQ tablet TAKE 2 TABLET BY MOUTH TWICE A DAY  . loratadine (CLARITIN) 10 MG tablet Take 10 mg by mouth daily.  [name] losartan-hydrochlorothiazide (HYZAAR) 100-25 MG tablet TAKE 1 TABLET BY MOUTH DAILY.  . Multiple Vitamin (MULTIVITAMIN) tablet Take 1 tablet by mouth daily.  . nitroGLYCERIN (NITROSTAT) 0.4 MG SL tablet Place 1 tablet (0.4 mg total) under the tongue every 5 (five) minutes as needed for chest pain.  . Omega-3 Fatty Acids (FISH OIL) 1000 MG CAPS Take 2,000 mg by mouth 2 (two) times daily.  [name] omeprazole (PRILOSEC) 40 MG capsule TAKE 1 CAPSULE (40 MG TOTAL) BY MOUTH 2 (TWO) TIMES DAILY.  [name] tretinoin (RETIN-A) 0.1 % cream Apply 1 application topically every other day.  . Vitamin D, Ergocalciferol, 2000 units CAPS Take 1 capsule by mouth daily.   . [DISCONTINUED] estradiol (ESTRACE) 1 MG tablet Take 1 mg by mouth daily.   Allergies: Patient is allergic to anticoagulant compound; morphine and related; bextra [valdecoxib]; hydrocod polst-cpm polst er; lipitor [atorvastatin]; mevacor [lovastatin]; plavix  [clopidogrel bisulfate]; zocor [simvastatin]; doxycycline; metoprolol; plavix [clopidogrel bisulfate]; codeine; hydrocod polst-cpm polst er; and lisinopril.  Past Medical History Patient  has a past medical history of AAA (abdominal aortic aneurysm) ([place]), Adenomatous colon polyp (05/2001), Adenomatous duodenal polyp, Allergy, Anginal pain ([place]), COPD (chronic obstructive pulmonary disease) ([place]), Coronary artery disease, Diabetes mellitus without complication ([place]), Diverticulosis, Eczema, dyshidrotic, Eczema, dyshidrotic (1986), Fibromyalgia, GERD (gastroesophageal reflux disease), Hyperlipidemia, Hypertension, Migraine,  Myocardial infarction ([place]), Osteoarthritis, Peripheral arterial disease ([place]), and Renal artery stenosis ([place]). Past Surgical History Patient  has a past surgical history that includes Renal artery stent (2008); Coronary artery bypass graft (07/1994); Carpal tunnel release (08/1993); Tonsilectomy, adenoidectomy, bilateral myringotomy and tubes (1989); Total abdominal hysterectomy (1992); Carpal tunnel release (01/1997); thyroid cyst apiration (05/1997 and 07/1998); Cardiovascular stress test (11/11/2009); Cardiac catheterization; Abdominal aortic aneurysm repair (2009); left heart catheterization with coronary/graft angiogram (N/A, 06/11/2013); abdominal aortagram (N/A, 03/06/2014); Hand surgery (Right, 09/2002); Multiple tooth extractions (2000); Abdominal hysterectomy; and dental implants. Family History: Patient family history includes AAA (abdominal aortic aneurysm) in her brother; Colon cancer in her paternal grandmother; Diabetes in her brother and sister; Heart attack in her brother, father, mother, and sister; Heart disease in her brother, father, mother, and sister; Hyperlipidemia in her brother and sister; Hypertension in her brother and sister; Liver cancer in her sister. Social History:  Patient  reports that she quit smoking about 21 years ago. Her smoking use included cigarettes. She has a 37.00 pack-year smoking history. she has never used smokeless tobacco. She reports that she drinks about 3.6 - 6.0 oz of alcohol per week. She reports that she does  not use drugs.  Review of Systems: Constitutional: negative for fever or malaise Cardiovascular: negative for chest pain Respiratory: negative for SOB or persistent cough Gastrointestinal: negative for abdominal pain Genitourinary: negative for dysuria or gross hematuria Musculoskeletal: negative for new gait disturbance or muscular weakness Integumentary: negative for new or persistent rashes  Patient Care Team    Relationship Specialty  Notifications Start End  [name], MD PCP - General Family Medicine  05/21/13   [place], Peter M, MD Consulting Physician Cardiology  05/24/17   Vicie Mutters, MD Consulting Physician Otolaryngology  11/10/17   Ladene Artist, MD Consulting Physician Gastroenterology  11/10/17   Penni Bombard, MD Consulting Physician Neurology  11/10/17   Reynold Bowen, MD Consulting Physician Endocrinology  11/10/17   Sharyne Peach, MD Consulting Physician Ophthalmology  11/10/17   Renelda Mom, DMD Consulting Physician Dentistry  11/10/17     Objective  Vitals: BP 126/60 (BP Location: Left Arm, Patient Position: Sitting, Cuff Size: Normal)   Pulse 79   Temp 98.1 F (36.7 C) (Oral)   Ht 5' (1.524 m)   Wt 151 lb (68.5 kg)   SpO2 96%   BMI 29.49 kg/m  General:  Well developed, well nourished, no acute distress  Psych:  Alert and oriented,normal mood and affect HEENT:  Normocephalic, atraumatic, supple neck  Cardiovascular:  RRR without murmur Respiratory:  Good breath sounds bilaterally, CTAB with normal respiratory effort Gastrointestinal: normal bowel sounds, soft, non-tender, no noted masses. No HSM Skin:  Warm, no rashes Neurologic:   Mental status is normal. Gross motor and sensory exams are normal. Normal gait  Assessment  1. Essential hypertension   2. Peripheral vascular disease, unspecified ([place])   3. IFG (impaired fasting glucose)   4. DJD (degenerative joint disease), lumbosacral   5. Hormone replacement therapy (HRT)   6. Fatty liver      Plan   BP is controlled as is vascular disease. No changes in meds. F/u with cards  IFG - stable a1c. Recheck labs at next visit. No sxs of hyperglycemia  DJD - stable   HRT - decrease back to 0.5mg  daily  Fatty liver - will continue to monitor lfts on statin.   Follow up: Return in about 8 weeks (around 01/05/2018) for your complete physical.   Commons side effects, risks, benefits, and alternatives for medications and  treatment plan prescribed today were discussed, and the patient expressed understanding of the given instructions. Patient is instructed to call or message via MyChart if he/she has any questions or concerns regarding our treatment plan. No barriers to understanding were identified. We discussed Red Flag symptoms and signs in detail. Patient expressed understanding regarding what to do in case of urgent or emergency type symptoms.   Medication list was reconciled, printed and provided to the patient in AVS. Patient instructions and summary information was reviewed with the patient as documented in the AVS. This note was prepared with assistance of Dragon voice recognition software. Occasional wrong-word or sound-a-like substitutions may have occurred due to the inherent limitations of voice recognition software  No orders of the defined types were placed in this encounter.  Meds ordered this encounter  Medications  . estradiol (ESTRACE) 0.5 MG tablet    Sig: Take 1 tablet (0.5 mg total) by mouth daily.    Dispense:  90 tablet    Refill:  3

## 2017-11-10 NOTE — Patient Instructions (Signed)
It was so good seeing you again! Thank you for establishing with my new practice and allowing me to continue caring for you. It means a lot to me.   Please schedule a follow up appointment with me in 1-2 months for your annual physical.   Medicare recommends an Annual Wellness Visit for all patients. Please schedule this to be done with our Nurse Educator, [name] anytime after January 2019. This is an informative "talk" visit; it's goals are to ensure that your health care needs are being met and to give you education regarding avoiding falls, ensuring you are not suffering from depression or problems with memory or thinking, and to educate you on Advance Care Planning. It helps me take good care of you!

## 2017-11-15 ENCOUNTER — Other Ambulatory Visit: Payer: Self-pay | Admitting: Cardiology

## 2017-11-16 NOTE — Telephone Encounter (Signed)
REFILL 

## 2017-12-07 ENCOUNTER — Encounter: Payer: Medicare Other | Admitting: Family Medicine

## 2017-12-07 ENCOUNTER — Ambulatory Visit: Payer: Medicare Other

## 2017-12-12 ENCOUNTER — Other Ambulatory Visit: Payer: Self-pay

## 2017-12-12 MED ORDER — AMLODIPINE BESYLATE 5 MG PO TABS
5.0000 mg | ORAL_TABLET | Freq: Every day | ORAL | 1 refills | Status: DC
Start: 2017-12-12 — End: 2018-06-17

## 2017-12-12 NOTE — Telephone Encounter (Signed)
Rx(s) sent to pharmacy electronically.  

## 2017-12-26 NOTE — Progress Notes (Signed)
Cardiology Office Note   Date:  12/29/2017   ID:  Megan [name], Megan [name] 1946/04/08, MRN [id number]  PCP:  [name], MD  Cardiologist: [name] MD  Chief Complaint  Patient presents with  . Follow-up  . Chest Pain  . Edema    Feet and ankles at times.      History of Present Illness: Megan [name] is a 72 y.o. female who is seen for follow up of CAD, HTN, HL, and PAD.   She has a past history of essential hypertension and  coronary artery disease. Also has a history of dyslipidemia. She is status post coronary artery bypass graft surgery in 1995. She had a renal artery stent for renal artery stenosis in 2008. She had a stent graft of an abdominal aortic aneurysm 2009. She is s/p stenting of the left iliac. She is followed by [name] for PAD. Last CT in May 2018. She has had chronic mild elevation of liver function enzymes secondary to modest alcohol intake and to fatty liver.   She was hospitalized in July 2014 and had cardiac catheterization which showed that her grafts were open. EF normal.  She was hospitalized 11/15/2014 with which he describes as an aching sensation in her chest.  She underwent a treadmill Myoview on 11/17/14 which showed no ischemia and her ejection fraction was 86%.  On follow up today she is doing well from a cardiac standpoint. She admits she hasn't been as active with the bad weather. She still walks 4 days/week.   She has not been experiencing any palpitations dizziness or syncope. Her HAs have resolved. She does note some balance issues and has had some PT. She is having increased issues with hair loss the last few weeks.   Past Medical History:  Diagnosis Date  . AAA (abdominal aortic aneurysm) ([place])    a. s/p stent grafting in 2009.  . Adenomatous colon polyp 05/2001  . Adenomatous duodenal polyp   . Allergy   . Anginal pain ([place])   . COPD (chronic obstructive pulmonary disease) ([place])   . Coronary artery disease    a. s/p CABG in  1995;  b. 05/2013 Neg MV, EF 82%;  c. 06/2013 Cath: LM nl, LAD 40-50p, LCX nl, RCA 80p/111m, VG->RCA->PDA->PLSA min irregs, LIMA->LAD atretic, vigorous LV fxn.  . Diabetes mellitus without complication ([place])   . Diverticulosis   . Eczema, dyshidrotic   . Eczema, dyshidrotic 1986   Dr. Mathis Fare  . Fibromyalgia   . GERD (gastroesophageal reflux disease)   . Hyperlipidemia   . Hypertension   . Migraine   . Myocardial infarction ([place])   . Osteoarthritis   . Peripheral arterial disease (HCC)    left leg diagnosed by Dr Mare Ferrari  . Renal artery stenosis ([place])    a. 2008 s/p PTA    Past Surgical History:  Procedure Laterality Date  . ABDOMINAL AORTAGRAM N/A 03/06/2014   Procedure: ABDOMINAL AORTAGRAM;  Surgeon: Rosetta Posner, MD;  Location: [facility] CATH LAB;  Service: Cardiovascular;  Laterality: N/A;  . ABDOMINAL AORTIC ANEURYSM REPAIR  2009   stenting  . ABDOMINAL HYSTERECTOMY     Total, 1992  . CARDIAC CATHETERIZATION    . CARDIOVASCULAR STRESS TEST  11/11/2009   normal study  . CARPAL TUNNEL RELEASE  08/1993   left wrist  . CARPAL TUNNEL RELEASE  01/1997   right  . CORONARY ARTERY BYPASS GRAFT  07/1994   Triple by Dr Jaquita Rector  [place]  . dental implants    . HAND SURGERY Right 09/2002   Right hand pulley release  . LEFT HEART CATHETERIZATION WITH CORONARY/GRAFT ANGIOGRAM N/A 06/11/2013   Procedure: LEFT HEART CATHETERIZATION WITH [name];  Surgeon: Thayer Headings, MD;  Location: [facility] CATH LAB;  Service: Cardiovascular;  Laterality: N/A;  . MULTIPLE TOOTH EXTRACTIONS  2000   All upper teeth.  Has full dneture.   [name] RENAL ARTERY STENT  2008  . thyroid cyst apiration  05/1997 and 07/1998  . TONSILECTOMY, ADENOIDECTOMY, BILATERAL MYRINGOTOMY AND TUBES  1989  . TOTAL ABDOMINAL HYSTERECTOMY  1992     Current Outpatient Medications  Medication Sig Dispense Refill  . ACCU-CHEK FASTCLIX LANCETS MISC   6  . ALPRAZolam (XANAX) 0.25 MG tablet Take 1 tablet (0.25 mg total) by mouth at  bedtime as needed. 90 tablet 1  . amLODipine (NORVASC) 5 MG tablet Take 1 tablet (5 mg total) by mouth daily. 90 tablet 1  . aspirin 325 MG tablet Take 325 mg by mouth daily.    [name] atorvastatin (LIPITOR) 40 MG tablet TAKE 1 TABLET BY MOUTH DAILY AT 6 PM 90 tablet 1  . augmented betamethasone dipropionate (DIPROLENE-AF) 0.05 % cream Apply 1 application topically daily as needed. For hands  1  . ciclopirox (LOPROX) 0.77 % cream Apply topically as needed.    . clindamycin (CLEOCIN T) 1 % lotion Apply topically as needed.    . clobetasol cream (TEMOVATE) 7.06 % Apply 1 application topically 2 (two) times daily as needed.    . cycloSPORINE (RESTASIS) 0.05 % ophthalmic emulsion Place 1 drop into both eyes at bedtime.    [name] estradiol (ESTRACE) 0.5 MG tablet Take 1 tablet (0.5 mg total) by mouth daily. 90 tablet 3  . hydrALAZINE (APRESOLINE) 10 MG tablet Take 1 tablet (10 mg total) by mouth 3 (three) times daily. 270 tablet 3  . KLOR-CON 10 10 MEQ tablet TAKE 2 TABLET BY MOUTH TWICE A DAY 120 tablet 6  . losartan-hydrochlorothiazide (HYZAAR) 100-25 MG tablet TAKE 1 TABLET BY MOUTH DAILY. 90 tablet 3  . Multiple Vitamin (MULTIVITAMIN) tablet Take 1 tablet by mouth daily.    . nitroGLYCERIN (NITROSTAT) 0.4 MG SL tablet Place 1 tablet (0.4 mg total) under the tongue every 5 (five) minutes as needed for chest pain. 25 tablet PRN  . omeprazole (PRILOSEC) 40 MG capsule TAKE 1 CAPSULE (40 MG TOTAL) BY MOUTH 2 (TWO) TIMES DAILY. 180 capsule 1  . tretinoin (RETIN-A) 0.1 % cream Apply 1 application topically every other day.    . Vitamin D, Ergocalciferol, 2000 units CAPS Take 1 capsule by mouth daily.      No current facility-administered medications for this visit.     Allergies:   Anticoagulant compound; Morphine and related; Bextra [valdecoxib]; Hydrocod polst-cpm polst er; Lipitor [atorvastatin]; Mevacor [lovastatin]; Plavix  [clopidogrel bisulfate]; Zocor [simvastatin]; Doxycycline; Metoprolol; Plavix  [clopidogrel bisulfate]; Codeine; Hydrocod polst-cpm polst er; and Lisinopril    Social History:  The patient  reports that she quit smoking about 22 years ago. Her smoking use included cigarettes. She has a 37.00 pack-year smoking history. she has never used smokeless tobacco. She reports that she drinks about 3.6 - 6.0 oz of alcohol per week. She reports that she does not use drugs.   Family History:  The patient's family history includes AAA (abdominal aortic aneurysm) in her brother; Colon cancer in her paternal grandmother; Diabetes in her brother and sister; Heart attack in her brother,  father, mother, and sister; Heart disease in her brother, father, mother, and sister; Hyperlipidemia in her brother and sister; Hypertension in her brother and sister; Liver cancer in her sister.    ROS:  Please see the history of present illness.   Otherwise, review of systems are positive for none.   All other systems are reviewed and negative.    PHYSICAL EXAM: VS:  BP (!) 140/54   Pulse 86   Ht 5' (1.524 m)   Wt 155 lb (70.3 kg)   BMI 30.27 kg/m  , BMI Body mass index is 30.27 kg/m. GENERAL:  Well appearing WF in NAD HEENT:  PERRL, EOMI, sclera are clear. Oropharynx is clear. NECK:  No jugular venous distention, carotid upstroke brisk and symmetric, no bruits, no thyromegaly or adenopathy LUNGS:  Clear to auscultation bilaterally CHEST:  Unremarkable HEART:  RRR,  PMI not displaced or sustained,S1 and S2 within normal limits, no S3, no S4: no clicks, no rubs, no murmurs ABD:  Soft, nontender. BS +, no masses or bruits. No hepatomegaly, no splenomegaly EXT:  2 + pulses throughout, no edema, no cyanosis no clubbing SKIN:  Warm and dry.  No rashes NEURO:  Alert and oriented x 3. Cranial nerves II through XII intact. PSYCH:  Cognitively intact     EKG:  EKG is done today. It shows NSR with a normal Ecg. I have personally reviewed and interpreted this study.    Recent Labs: 06/23/2017: ALT  63; BUN 14; Creatinine, Ser 0.79; Potassium 4.2; Sodium 141   Labs from primary care 12/15/16: A1c 5.6%. : Cholesterol 167 Trig-209, HDL 52, LDL 73.  AST 51, ALT 65. Normal renal indices. TSH is normal. Dated 06/24/17: CRP <0.3. Glucose 120, AST 58, ALT 80, other chemistries normal.   Lipid Panel    Component Value Date/Time   CHOL 153 06/23/2017 0742   TRIG 172 (H) 06/23/2017 0742   HDL 64 06/23/2017 0742   CHOLHDL 2.4 06/23/2017 0742   CHOLHDL 3.2 06/29/2016 1054   VLDL 47 (H) 06/29/2016 1054   LDLCALC 55 06/23/2017 0742   LDLDIRECT 87.4 12/09/2014 0808      Wt Readings from Last 3 Encounters:  12/29/17 155 lb (70.3 kg)  11/10/17 151 lb (68.5 kg)  10/13/17 153 lb (69.4 kg)      ASSESSMENT AND PLAN:  1.CAD status post CABG in 1995. Most recent cardiac cath in July 2014 showed no recurrent stenoses and patent grafts. Most recent treadmill Myoview 11/17/14 normal with ejection fraction 86%. She is asymptomatic. She notes that prior stress tests really haven't been very helpful. Will just monitor symptoms for now.  2. HTN with hypertensive heart disease. BP is under good control. 3. Hypercholesterolemia- excellent control on lipitor 4. Abnormal liver function studies with mild transaminase elevation.  Last labs stable.  5. Status post stent to right renal artery 2008. Patent by CT in May 6. Status post stent graft to abdominal aortic aneurysm 2009.  S/p iliac stent in 2016. Stable by CT in May.  7. Peripheral artery disease followed by [name] 8. HA - now resolved. ? Related to tick borne illness. This might also explain more recent hair loss. No change in medication recently.   Current medicines are reviewed at length with the patient today.  The patient does not have concerns regarding medicines.  Disposition: Continue current medication.  Recheck in 6 months for follow-up office visit.    Signed, [name] MD, [facility]    12/29/2017 10:53  AM    [place]

## 2017-12-29 ENCOUNTER — Encounter: Payer: Self-pay | Admitting: Cardiology

## 2017-12-29 ENCOUNTER — Ambulatory Visit: Payer: Medicare Other | Admitting: Cardiology

## 2017-12-29 VITALS — BP 140/54 | HR 86 | Ht 60.0 in | Wt 155.0 lb

## 2017-12-29 DIAGNOSIS — I119 Hypertensive heart disease without heart failure: Secondary | ICD-10-CM | POA: Diagnosis not present

## 2017-12-29 DIAGNOSIS — I25810 Atherosclerosis of coronary artery bypass graft(s) without angina pectoris: Secondary | ICD-10-CM | POA: Diagnosis not present

## 2017-12-29 DIAGNOSIS — E785 Hyperlipidemia, unspecified: Secondary | ICD-10-CM | POA: Diagnosis not present

## 2017-12-29 DIAGNOSIS — I739 Peripheral vascular disease, unspecified: Secondary | ICD-10-CM | POA: Diagnosis not present

## 2017-12-29 NOTE — Patient Instructions (Signed)
Continue your current therapy  I will see you in 6 months with lab work   

## 2018-01-04 ENCOUNTER — Encounter: Payer: Medicare Other | Admitting: Family Medicine

## 2018-01-04 ENCOUNTER — Ambulatory Visit: Payer: Medicare Other

## 2018-01-11 NOTE — Progress Notes (Addendum)
Subjective:   Megan [name] is a 72 y.o. female who presents for Medicare Annual (Subsequent) preventive examination.  Review of Systems:  No ROS.  Medicare Wellness Visit. Additional risk factors are reflected in the social history.  Cardiac Risk Factors include: advanced age (>35men, >20 women);diabetes mellitus;family history of premature cardiovascular disease;obesity (BMI >30kg/m2);hypertension;female gender   Sleep patterns: Sleeps 7-8 hours, up to void x 2. Does not sleep well d/t back/fibromyalgia pain.  Home Safety/Smoke Alarms: Feels safe in home. Smoke alarms in place.  Living environment; residence and Firearm Safety: Lives with husband, Megan [name], in 4 story home.  Seat Belt Safety/Bike Helmet: Wears seat belt.   Female:   Pap-NA       Mammo-08/22/17, normal (right). Recall 02/08/2018 Left breast US-08/29/17,  BI-RADS CATEGORY  4: Suspicious Left breast bx-08/30/17,  Pathology revealed USUAL DUCTAL HYPERPLASIA, PSEUDOANGIOMATOUS STROMAL HYPERPLASIA, SCLEROSING ADENOSIS, FIBROCYSTIC CHANGES INCLUDING APOCRINE METAPLASIA WITH CALCIFICATIONS of the Left breast, lateral posterior depth      Dexa scan-03/07/2003. Declines testing.         CCS-Colonoscopy 09/19/2014, polyp. Recall 5 years.      Objective:     Vitals: BP (!) 144/80 (BP Location: Left Arm, Patient Position: Sitting, Cuff Size: Normal)   Pulse 70   Ht 5' (1.524 m)   Wt 157 lb (71.2 kg)   SpO2 97%   BMI 30.66 kg/m   Body mass index is 30.66 kg/m.  Advanced Directives 01/12/2018 08/24/2017 05/24/2017 03/30/2016 11/15/2014 03/06/2014 06/09/2013  Does Patient Have a Medical Advance Directive? Yes Yes Yes Yes Yes Patient has advance directive, copy not in chart Patient has advance directive, copy not in chart  Type of Advance Directive Living will;Healthcare Power of [place];Living will [place];Living will [place];Living will - [place];Living will Living will  Does patient want to make changes to medical advance directive? - - - - - No change requested -  Copy of [place] in Chart? No - copy requested - - No - copy requested - Copy requested from family Copy requested from family  Pre-existing out of facility DNR order (yellow form or pink MOST form) - - - - - - No    Tobacco Social History   Tobacco Use  Smoking Status Former Smoker  . Packs/day: 1.00  . Years: 37.00  . Pack years: 37.00  . Types: Cigarettes  . Last attempt to quit: 12/07/1995  . Years since quitting: 22.1  Smokeless Tobacco Never Used     Counseling given: Not Answered   Past Medical History:  Diagnosis Date  . AAA (abdominal aortic aneurysm) ([place])    a. s/p stent grafting in 2009.  . Adenomatous colon polyp 05/2001  . Adenomatous duodenal polyp   . Allergy   . Anginal pain ([place])   . COPD (chronic obstructive pulmonary disease) ([place])   . Coronary artery disease    a. s/p CABG in 1995;  b. 05/2013 Neg MV, EF 82%;  c. 06/2013 Cath: LM nl, LAD 40-50p, LCX nl, RCA 80p/137m, VG->RCA->PDA->PLSA min irregs, LIMA->LAD atretic, vigorous LV fxn.  . Diabetes mellitus without complication ([place])   . Diverticulosis   . Eczema, dyshidrotic   . Eczema, dyshidrotic 1986   Dr. Mathis Fare  . Fibromyalgia   . GERD (gastroesophageal reflux disease)   . Hyperlipidemia   . Hypertension   . Migraine   . Myocardial infarction ([place])   .  Osteoarthritis   . Peripheral arterial disease (HCC)    left leg diagnosed by Dr Mare Ferrari  . Renal artery stenosis ([place])    a. 2008 s/p PTA   Past Surgical History:  Procedure Laterality Date  . ABDOMINAL AORTAGRAM N/A 03/06/2014   Procedure: ABDOMINAL AORTAGRAM;  Surgeon: Rosetta Posner, MD;  Location: [facility] CATH LAB;  Service: Cardiovascular;  Laterality: N/A;  . ABDOMINAL AORTIC ANEURYSM REPAIR  2009   stenting  . ABDOMINAL HYSTERECTOMY     Total, 1992  . CARDIAC CATHETERIZATION    .  CARDIOVASCULAR STRESS TEST  11/11/2009   normal study  . CARPAL TUNNEL RELEASE  08/1993   left wrist  . CARPAL TUNNEL RELEASE  01/1997   right  . CORONARY ARTERY BYPASS GRAFT  07/1994   Triple by Dr Ceasar Mons  . dental implants    . HAND SURGERY Right 09/2002   Right hand pulley release  . LEFT HEART CATHETERIZATION WITH CORONARY/GRAFT ANGIOGRAM N/A 06/11/2013   Procedure: LEFT HEART CATHETERIZATION WITH [name];  Surgeon: Thayer Headings, MD;  Location: [facility] CATH LAB;  Service: Cardiovascular;  Laterality: N/A;  . MULTIPLE TOOTH EXTRACTIONS  2000   All upper teeth.  Has full dneture.   [name] RENAL ARTERY STENT  2008  . thyroid cyst apiration  05/1997 and 07/1998  . TONSILECTOMY, ADENOIDECTOMY, BILATERAL MYRINGOTOMY AND TUBES  1989  . TOTAL ABDOMINAL HYSTERECTOMY  1992   Family History  Problem Relation Age of Onset  . Heart disease Mother   . Heart attack Mother   . Heart disease Father        before age 34  . Heart attack Father   . Heart disease Sister        before age 43  . Diabetes Sister   . Hyperlipidemia Sister   . Hypertension Sister   . Heart attack Sister   . Liver cancer Sister   . Heart disease Brother   . Diabetes Brother   . Hyperlipidemia Brother   . Hypertension Brother   . Heart attack Brother   . AAA (abdominal aortic aneurysm) Brother   . Colon cancer Paternal Grandmother   . Heart disease Brother   . Pancreatic cancer Neg Hx   . Rectal cancer Neg Hx   . Stomach cancer Neg Hx   . Esophageal cancer Neg Hx    Social History   Socioeconomic History  . Marital status: Married    Spouse name: None  . Number of children: None  . Years of education: None  . Highest education level: None  Social Needs  . Financial resource strain: None  . Food insecurity - worry: None  . Food insecurity - inability: None  . Transportation needs - medical: None  . Transportation needs - non-medical: None  Occupational History  . None  Tobacco Use  .  Smoking status: Former Smoker    Packs/day: 1.00    Years: 37.00    Pack years: 37.00    Types: Cigarettes    Last attempt to quit: 12/07/1995    Years since quitting: 22.1  . Smokeless tobacco: Never Used  Substance and Sexual Activity  . Alcohol use: Yes    Alcohol/week: 3.6 - 6.0 oz    Types: 6 - 10 Glasses of wine per week    Comment: 2-3 glasses daily  . Drug use: No  . Sexual activity: Not Currently    Partners: Male  Other Topics Concern  . None  Social History Narrative   Lives at home with husband.  Is retired.  Education 4 yrs of college.  No children.   Caffiene 2 cups daily.     Outpatient Encounter Medications as of 01/12/2018  Medication Sig  . ALPRAZolam (XANAX) 0.25 MG tablet Take 1 tablet (0.25 mg total) by mouth at bedtime as needed.  [name] amLODipine (NORVASC) 5 MG tablet Take 1 tablet (5 mg total) by mouth daily.  [name] aspirin 325 MG tablet Take 325 mg by mouth daily.  [name] atorvastatin (LIPITOR) 40 MG tablet TAKE 1 TABLET BY MOUTH DAILY AT 6 PM  . augmented betamethasone dipropionate (DIPROLENE-AF) 0.05 % cream Apply 1 application topically daily as needed. For hands  . ciclopirox (LOPROX) 0.77 % cream Apply topically as needed.  . clindamycin (CLEOCIN T) 1 % lotion Apply topically as needed.  . clobetasol cream (TEMOVATE) 2.20 % Apply 1 application topically 2 (two) times daily as needed.  . cycloSPORINE (RESTASIS) 0.05 % ophthalmic emulsion Place 1 drop into both eyes at bedtime.  [name] estradiol (ESTRACE) 0.5 MG tablet Take 1 tablet (0.5 mg total) by mouth daily.  . hydrALAZINE (APRESOLINE) 10 MG tablet Take 1 tablet (10 mg total) by mouth 3 (three) times daily.  [name] KLOR-CON 10 10 MEQ tablet TAKE 2 TABLET BY MOUTH TWICE A DAY  . losartan-hydrochlorothiazide (HYZAAR) 100-25 MG tablet TAKE 1 TABLET BY MOUTH DAILY.  . Multiple Vitamin (MULTIVITAMIN) tablet Take 1 tablet by mouth daily.  . nitroGLYCERIN (NITROSTAT) 0.4 MG SL tablet Place 1 tablet (0.4 mg total) under the tongue  every 5 (five) minutes as needed for chest pain.  [name] omeprazole (PRILOSEC) 40 MG capsule TAKE 1 CAPSULE (40 MG TOTAL) BY MOUTH 2 (TWO) TIMES DAILY.  [name] tretinoin (RETIN-A) 0.1 % cream Apply 1 application topically every other day.  . Vitamin D, Ergocalciferol, 2000 units CAPS Take 1 capsule by mouth daily.   . [DISCONTINUED] ACCU-CHEK FASTCLIX LANCETS MISC   . Zoster Vaccine Adjuvanted [facility]) injection Inject 0.5 mLs into the muscle once for 1 dose.   No facility-administered encounter medications on file as of 01/12/2018.     Activities of Daily Living In your present state of health, do you have any difficulty performing the following activities: 01/12/2018 10/13/2017  Hearing? N N  Vision? N N  Difficulty concentrating or making decisions? N N  Walking or climbing stairs? N N  Dressing or bathing? N N  Doing errands, shopping? N N  Preparing Food and eating ? N -  Using the Toilet? N -  In the past six months, have you accidently leaked urine? N -  Do you have problems with loss of bowel control? N -  Managing your Medications? N -  Managing your Finances? N -  Housekeeping or managing your Housekeeping? N -  Some recent data might be hidden    Patient Care Team: [name], MD as PCP - General (Family Medicine) Martinique, Peter M, MD as Consulting Physician (Cardiology) Vicie Mutters, MD as Consulting Physician (Otolaryngology) Ladene Artist, MD as Consulting Physician (Gastroenterology) Penni Bombard, MD as Consulting Physician (Neurology) Reynold Bowen, MD as Consulting Physician (Endocrinology) Sharyne Peach, MD as Consulting Physician (Ophthalmology) Renelda Mom, DMD as Consulting Physician (Dentistry) Rolm Bookbinder, MD as Consulting Physician (Dermatology) Pa, [place] (Specialist)    Assessment:   This is a routine wellness examination for Adreanna.  Exercise Activities and Dietary recommendations Current Exercise Habits: Home exercise  routine, Type of exercise: walking,  Time (Minutes): 20, Frequency (Times/Week): 7, Weekly Exercise (Minutes/Week): 140, Exercise limited by: orthopedic condition(s)   Diet (meal preparation, eat out, water intake, caffeinated beverages, dairy products, fruits and vegetables): Drinks hot tea and wine.   Eats 2 meals/day typically, plenty of vegetables.   Goals    . Patient Stated     Maintain current health by staying active.        Fall Risk Fall Risk  01/12/2018 10/13/2017  Falls in the past year? No No    Depression Screen PHQ 2/9 Scores 01/12/2018 10/13/2017  PHQ - 2 Score 0 0     Cognitive Function       Ad8 score reviewed for issues:  Issues making decisions: no  Less interest in hobbies / activities: no  Repeats questions, stories (family complaining): no  Trouble using ordinary gadgets (microwave, computer, phone): no  Forgets the month or year: no  Mismanaging finances: no  Remembering appts: no  Daily problems with thinking and/or memory: no Ad8 score is=0     Immunization History  Administered Date(s) Administered  . Influenza, Seasonal, Injecte, Preservative Fre 09/21/2014, 09/06/2015, 07/21/2016  . Influenza-Unspecified 08/10/2017  . PPD Test 09/13/1979  . Pneumococcal Conjugate-13 12/31/2016  . Pneumococcal Polysaccharide-23 12/16/2014  . Td 05/13/2008  . Tdap 08/09/2012  . Zoster 11/03/2010     Screening Tests Health Maintenance  Topic Date Due  . MAMMOGRAM  08/22/2018  . COLONOSCOPY  09/20/2019  . TETANUS/TDAP  08/09/2022  . INFLUENZA VACCINE  Completed  . DEXA SCAN  Completed  . Hepatitis C Screening  Completed  . PNA vac Low Risk Adult  Completed       Plan:     Shingles vaccine at pharmacy.   Bring a copy of your living will and/or healthcare power of attorney to your next office visit.  Continue doing brain stimulating activities (puzzles, reading, adult coloring books, staying active) to keep memory sharp.   I have  personally reviewed and noted the following in the patient's chart:   . Medical and social history . Use of alcohol, tobacco or illicit drugs  . Current medications and supplements . Functional ability and status . Nutritional status . Physical activity . Advanced directives . List of other physicians . Hospitalizations, surgeries, and ER visits in previous 12 months . Vitals . Screenings to include cognitive, depression, and falls . Referrals and appointments  In addition, I have reviewed and discussed with patient certain preventive protocols, quality metrics, and best practice recommendations. A written personalized care plan for preventive services as well as general preventive health recommendations were provided to patient.    Gerilyn Nestle, RN  01/12/2018   RN [place] note reviewed in PCP absence.   Leeanne Rio, PA-C

## 2018-01-12 ENCOUNTER — Other Ambulatory Visit: Payer: Self-pay

## 2018-01-12 ENCOUNTER — Ambulatory Visit (INDEPENDENT_AMBULATORY_CARE_PROVIDER_SITE_OTHER): Payer: Medicare Other

## 2018-01-12 VITALS — BP 144/80 | HR 70 | Ht 60.0 in | Wt 157.0 lb

## 2018-01-12 DIAGNOSIS — Z23 Encounter for immunization: Secondary | ICD-10-CM

## 2018-01-12 DIAGNOSIS — Z0000 Encounter for general adult medical examination without abnormal findings: Secondary | ICD-10-CM | POA: Diagnosis not present

## 2018-01-12 MED ORDER — ZOSTER VAC RECOMB ADJUVANTED 50 MCG/0.5ML IM SUSR: 0.5000 mL | Freq: Once | INTRAMUSCULAR | 1 refills | Status: AC

## 2018-01-12 NOTE — Patient Instructions (Addendum)
Shingles vaccine at pharmacy.   Bring a copy of your living will and/or healthcare power of attorney to your next office visit.  Continue doing brain stimulating activities (puzzles, reading, adult coloring books, staying active) to keep memory sharp.   Health Maintenance, Female Adopting a healthy lifestyle and getting preventive care can go a long way to promote health and wellness. Talk with your health care provider about what schedule of regular examinations is right for you. This is a good chance for you to check in with your provider about disease prevention and staying healthy. In between checkups, there are plenty of things you can do on your own. Experts have done a lot of research about which lifestyle changes and preventive measures are most likely to keep you healthy. Ask your health care provider for more information. Weight and diet Eat a healthy diet  Be sure to include plenty of vegetables, fruits, low-fat dairy products, and lean protein.  Do not eat a lot of foods high in solid fats, added sugars, or salt.  Get regular exercise. This is one of the most important things you can do for your health. ? Most adults should exercise for at least 150 minutes each week. The exercise should increase your heart rate and make you sweat (moderate-intensity exercise). ? Most adults should also do strengthening exercises at least twice a week. This is in addition to the moderate-intensity exercise.  Maintain a healthy weight  Body mass index (BMI) is a measurement that can be used to identify possible weight problems. It estimates body fat based on height and weight. Your health care provider can help determine your BMI and help you achieve or maintain a healthy weight.  For females 67 years of age and older: ? A BMI below 18.5 is considered underweight. ? A BMI of 18.5 to 24.9 is normal. ? A BMI of 25 to 29.9 is considered overweight. ? A BMI of 30 and above is considered  obese.  Watch levels of cholesterol and blood lipids  You should start having your blood tested for lipids and cholesterol at 74 years of age, then have this test every 5 years.  You may need to have your cholesterol levels checked more often if: ? Your lipid or cholesterol levels are high. ? You are older than 74 years of age. ? You are at high risk for heart disease.  Cancer screening Lung Cancer  Lung cancer screening is recommended for adults 45-23 years old who are at high risk for lung cancer because of a history of smoking.  A yearly low-dose CT scan of the lungs is recommended for people who: ? Currently smoke. ? Have quit within the past 15 years. ? Have at least a 30-pack-year history of smoking. A pack year is smoking an average of one pack of cigarettes a day for 1 year.  Yearly screening should continue until it has been 15 years since you quit.  Yearly screening should stop if you develop a health problem that would prevent you from having lung cancer treatment.  Breast Cancer  Practice breast self-awareness. This means understanding how your breasts normally appear and feel.  It also means doing regular breast self-exams. Let your health care provider know about any changes, no matter how small.  If you are in your 20s or 30s, you should have a clinical breast exam (CBE) by a health care provider every 1-3 years as part of a regular health exam.  If you are 40  or older, have a CBE every year. Also consider having a breast X-ray (mammogram) every year.  If you have a family history of breast cancer, talk to your health care provider about genetic screening.  If you are at high risk for breast cancer, talk to your health care provider about having an MRI and a mammogram every year.  Breast cancer gene (BRCA) assessment is recommended for women who have family members with BRCA-related cancers. BRCA-related cancers  include: ? Breast. ? Ovarian. ? Tubal. ? Peritoneal cancers.  Results of the assessment will determine the need for genetic counseling and BRCA1 and BRCA2 testing.  Cervical Cancer Your health care provider may recommend that you be screened regularly for cancer of the pelvic organs (ovaries, uterus, and vagina). This screening involves a pelvic examination, including checking for microscopic changes to the surface of your cervix (Pap test). You may be encouraged to have this screening done every 3 years, beginning at age 21.  For women ages 30-65, health care providers may recommend pelvic exams and Pap testing every 3 years, or they may recommend the Pap and pelvic exam, combined with testing for human papilloma virus (HPV), every 5 years. Some types of HPV increase your risk of cervical cancer. Testing for HPV may also be done on women of any age with unclear Pap test results.  Other health care providers may not recommend any screening for nonpregnant women who are considered low risk for pelvic cancer and who do not have symptoms. Ask your health care provider if a screening pelvic exam is right for you.  If you have had past treatment for cervical cancer or a condition that could lead to cancer, you need Pap tests and screening for cancer for at least 20 years after your treatment. If Pap tests have been discontinued, your risk factors (such as having a new sexual partner) need to be reassessed to determine if screening should resume. Some women have medical problems that increase the chance of getting cervical cancer. In these cases, your health care provider may recommend more frequent screening and Pap tests.  Colorectal Cancer  This type of cancer can be detected and often prevented.  Routine colorectal cancer screening usually begins at 74 years of age and continues through 75 years of age.  Your health care provider may recommend screening at an earlier age if you have risk factors  for colon cancer.  Your health care provider may also recommend using home test kits to check for hidden blood in the stool.  A small camera at the end of a tube can be used to examine your colon directly (sigmoidoscopy or colonoscopy). This is done to check for the earliest forms of colorectal cancer.  Routine screening usually begins at age 50.  Direct examination of the colon should be repeated every 5-10 years through 75 years of age. However, you may need to be screened more often if early forms of precancerous polyps or small growths are found.  Skin Cancer  Check your skin from head to toe regularly.  Tell your health care provider about any new moles or changes in moles, especially if there is a change in a mole's shape or color.  Also tell your health care provider if you have a mole that is larger than the size of a pencil eraser.  Always use sunscreen. Apply sunscreen liberally and repeatedly throughout the day.  Protect yourself by wearing long sleeves, pants, a wide-brimmed hat, and sunglasses whenever you are   outside.  Heart disease, diabetes, and high blood pressure  High blood pressure causes heart disease and increases the risk of stroke. High blood pressure is more likely to develop in: ? People who have blood pressure in the high end of the normal range (130-139/85-89 mm Hg). ? People who are overweight or obese. ? People who are African American.  If you are 87-36 years of age, have your blood pressure checked every 3-5 years. If you are 52 years of age or older, have your blood pressure checked every year. You should have your blood pressure measured twice-once when you are at a hospital or clinic, and once when you are not at a hospital or clinic. Record the average of the two measurements. To check your blood pressure when you are not at a hospital or clinic, you can use: ? An automated blood pressure machine at a pharmacy. ? A home blood pressure monitor.  If  you are between 28 years and 36 years old, ask your health care provider if you should take aspirin to prevent strokes.  Have regular diabetes screenings. This involves taking a blood sample to check your fasting blood sugar level. ? If you are at a normal weight and have a low risk for diabetes, have this test once every three years after 74 years of age. ? If you are overweight and have a high risk for diabetes, consider being tested at a younger age or more often. Preventing infection Hepatitis B  If you have a higher risk for hepatitis B, you should be screened for this virus. You are considered at high risk for hepatitis B if: ? You were born in a country where hepatitis B is common. Ask your health care provider which countries are considered high risk. ? Your parents were born in a high-risk country, and you have not been immunized against hepatitis B (hepatitis B vaccine). ? You have HIV or AIDS. ? You use needles to inject street drugs. ? You live with someone who has hepatitis B. ? You have had sex with someone who has hepatitis B. ? You get hemodialysis treatment. ? You take certain medicines for conditions, including cancer, organ transplantation, and autoimmune conditions.  Hepatitis C  Blood testing is recommended for: ? Everyone born from [birth year] through 1967. ? Anyone with known risk factors for hepatitis C.  Sexually transmitted infections (STIs)  You should be screened for sexually transmitted infections (STIs) including gonorrhea and chlamydia if: ? You are sexually active and are younger than 74 years of age. ? You are older than 74 years of age and your health care provider tells you that you are at risk for this type of infection. ? Your sexual activity has changed since you were last screened and you are at an increased risk for chlamydia or gonorrhea. Ask your health care provider if you are at risk.  If you do not have HIV, but are at risk, it may be recommended  that you take a prescription medicine daily to prevent HIV infection. This is called pre-exposure prophylaxis (PrEP). You are considered at risk if: ? You are sexually active and do not regularly use condoms or know the HIV status of your partner(s). ? You take drugs by injection. ? You are sexually active with a partner who has HIV.  Talk with your health care provider about whether you are at high risk of being infected with HIV. If you choose to begin PrEP, you should first be tested  for HIV. You should then be tested every 3 months for as long as you are taking PrEP. Pregnancy  If you are premenopausal and you may become pregnant, ask your health care provider about preconception counseling.  If you may become pregnant, take 400 to 800 micrograms (mcg) of folic acid every day.  If you want to prevent pregnancy, talk to your health care provider about birth control (contraception). Osteoporosis and menopause  Osteoporosis is a disease in which the bones lose minerals and strength with aging. This can result in serious bone fractures. Your risk for osteoporosis can be identified using a bone density scan.  If you are 45 years of age or older, or if you are at risk for osteoporosis and fractures, ask your health care provider if you should be screened.  Ask your health care provider whether you should take a calcium or vitamin D supplement to lower your risk for osteoporosis.  Menopause may have certain physical symptoms and risks.  Hormone replacement therapy may reduce some of these symptoms and risks. Talk to your health care provider about whether hormone replacement therapy is right for you. Follow these instructions at home:  Schedule regular health, dental, and eye exams.  Stay current with your immunizations.  Do not use any tobacco products including cigarettes, chewing tobacco, or electronic cigarettes.  If you are pregnant, do not drink alcohol.  If you are  breastfeeding, limit how much and how often you drink alcohol.  Limit alcohol intake to no more than 1 drink per day for nonpregnant women. One drink equals 12 ounces of beer, 5 ounces of wine, or 1 ounces of hard liquor.  Do not use street drugs.  Do not share needles.  Ask your health care provider for help if you need support or information about quitting drugs.  Tell your health care provider if you often feel depressed.  Tell your health care provider if you have ever been abused or do not feel safe at home. This information is not intended to replace advice given to you by your health care provider. Make sure you discuss any questions you have with your health care provider. Document Released: 06/07/2011 Document Revised: 04/29/2016 Document Reviewed: 08/26/2015 Elsevier Interactive Patient Education  [employer].

## 2018-01-13 ENCOUNTER — Other Ambulatory Visit: Payer: Self-pay

## 2018-01-13 ENCOUNTER — Encounter: Payer: Self-pay | Admitting: Family Medicine

## 2018-01-13 ENCOUNTER — Other Ambulatory Visit: Payer: Medicare Other

## 2018-01-13 ENCOUNTER — Ambulatory Visit (INDEPENDENT_AMBULATORY_CARE_PROVIDER_SITE_OTHER): Payer: Medicare Other | Admitting: Family Medicine

## 2018-01-13 VITALS — BP 126/80 | HR 69 | Temp 97.9°F | Ht 61.0 in | Wt 157.0 lb

## 2018-01-13 DIAGNOSIS — Z79890 Hormone replacement therapy (postmenopausal): Secondary | ICD-10-CM

## 2018-01-13 DIAGNOSIS — M8580 Other specified disorders of bone density and structure, unspecified site: Secondary | ICD-10-CM | POA: Diagnosis not present

## 2018-01-13 DIAGNOSIS — Z0000 Encounter for general adult medical examination without abnormal findings: Secondary | ICD-10-CM

## 2018-01-13 DIAGNOSIS — R7301 Impaired fasting glucose: Secondary | ICD-10-CM | POA: Diagnosis not present

## 2018-01-13 DIAGNOSIS — I10 Essential (primary) hypertension: Secondary | ICD-10-CM

## 2018-01-13 DIAGNOSIS — E782 Mixed hyperlipidemia: Secondary | ICD-10-CM

## 2018-01-13 DIAGNOSIS — I739 Peripheral vascular disease, unspecified: Secondary | ICD-10-CM

## 2018-01-13 DIAGNOSIS — K760 Fatty (change of) liver, not elsewhere classified: Secondary | ICD-10-CM | POA: Diagnosis not present

## 2018-01-13 LAB — LIPID PANEL
Cholesterol: 154 mg/dL (ref 0–200)
HDL: 50 mg/dL (ref >39.00)
LDL Cholesterol: 65 mg/dL (ref 0–99)
NonHDL: 103.64
Total CHOL/HDL Ratio: 3
Triglycerides: 192 mg/dL — ABNORMAL HIGH (ref 0.0–149.0)
VLDL: 38.4 mg/dL (ref 0.0–40.0)

## 2018-01-13 LAB — COMPREHENSIVE METABOLIC PANEL
ALT: 59 U/L — ABNORMAL HIGH (ref 0–35)
AST: 56 U/L — ABNORMAL HIGH (ref 0–37)
Albumin: 4.2 g/dL (ref 3.5–5.2)
Alkaline Phosphatase: 71 U/L (ref 39–117)
BUN: 14 mg/dL (ref 6–23)
CO2: 30 mEq/L (ref 19–32)
Calcium: 9.7 mg/dL (ref 8.4–10.5)
Chloride: 102 mEq/L (ref 96–112)
Creatinine, Ser: 0.71 mg/dL (ref 0.40–1.20)
GFR: 85.67 mL/min (ref >60.00)
Glucose, Bld: 118 mg/dL — ABNORMAL HIGH (ref 70–99)
Potassium: 3.9 mEq/L (ref 3.5–5.1)
Sodium: 139 mEq/L (ref 135–145)
Total Bilirubin: 0.9 mg/dL (ref 0.2–1.2)
Total Protein: 6.5 g/dL (ref 6.0–8.3)

## 2018-01-13 LAB — CBC WITH DIFFERENTIAL/PLATELET
Basophils Absolute: 54 cells/uL (ref 0–200)
Basophils Relative: 0.6 %
Eosinophils Absolute: 189 cells/uL (ref 15–500)
Eosinophils Relative: 2.1 %
HCT: 43.7 % (ref 35.0–45.0)
Hemoglobin: 15.1 g/dL (ref 11.7–15.5)
Lymphs Abs: 2637 cells/uL (ref 850–3900)
MCH: 30.4 pg (ref 27.0–33.0)
MCHC: 34.6 g/dL (ref 32.0–36.0)
MCV: 87.9 fL (ref 80.0–100.0)
MPV: 11.2 fL (ref 7.5–12.5)
Monocytes Relative: 6.7 %
Neutro Abs: 5517 cells/uL (ref 1500–7800)
Neutrophils Relative %: 61.3 %
Platelets: 233 10*3/uL (ref 140–400)
RBC: 4.97 10*6/uL (ref 3.80–5.10)
RDW: 12.5 % (ref 11.0–15.0)
Total Lymphocyte: 29.3 %
WBC mixed population: 603 cells/uL (ref 200–950)
WBC: 9 10*3/uL (ref 3.8–10.8)

## 2018-01-13 LAB — POCT URINALYSIS DIPSTICK
Bilirubin, UA: NEGATIVE
Blood, UA: NEGATIVE
Glucose, UA: NEGATIVE
Ketones, UA: NEGATIVE
Leukocytes, UA: NEGATIVE
Nitrite, UA: NEGATIVE
Protein, UA: NEGATIVE
Spec Grav, UA: 1.015 (ref 1.010–1.025)
Urobilinogen, UA: 0.2 E.U./dL
pH, UA: 6.5 (ref 5.0–8.0)

## 2018-01-13 LAB — HEMOGLOBIN A1C: Hgb A1c MFr Bld: 5.8 % (ref 4.6–6.5)

## 2018-01-13 NOTE — Patient Instructions (Addendum)
Please return in 6 months for blood pressure recheck  If you have any questions or concerns, please don't hesitate to send me a message via MyChart or call the office at [phone]. Thank you for visiting with [place] today! It's our pleasure caring for you.  Please do these things to maintain good health!   Exercise at least 30-45 minutes a day,  4-5 days a week.   Eat a low-fat diet with lots of fruits and vegetables, up to 7-9 servings per day.  Drink plenty of water daily. Try to drink 8 8oz glasses per day.  Seatbelts can save your life. Always wear your seatbelt.  Place Smoke Detectors on every level of your home and check batteries every year.  Schedule an appointment with an eye doctor for an eye exam every 1-2 years  Safe sex - use condoms to protect yourself from STDs if you could be exposed to these types of infections. Use birth control if you do not want to become pregnant and are sexually active.  Avoid heavy alcohol use. If you drink, keep it to less than 2 drinks/day and not every day.  [place].  Choose someone you trust that could speak for you if you became unable to speak for yourself.  Depression is common in our stressful world.If you're feeling down or losing interest in things you normally enjoy, please come in for a visit.  If anyone is threatening or hurting you, please get help. Physical or Emotional Violence is never OK.

## 2018-01-13 NOTE — Progress Notes (Signed)
Subjective  Chief Complaint  Patient presents with  . Annual Exam    Medicare Wellness Visit with Megan [name] on 2/7    HPI: Megan [name] is a 72 y.o. female who presents to [place] at [facility] today for a Female Wellness Visit.   Wellness Visit: annual visit with health maintenance review and exam without Pap   Had AWV yesterday; I have reviewed findings. No concerns.   Doing very well; had f/u with cards last week and all is stable. bp is controlled. Tolerating all meds. Due for f/u on lipids and IFG. No sxs of high blood sugar or low thyroid.  HM - declines dexa screening on hrt. Education done. No further discussion to be had.  Lifestyle: Body mass index is 29.66 kg/m. Wt Readings from Last 3 Encounters:  01/13/18 157 lb (71.2 kg)  01/12/18 157 lb (71.2 kg)  12/29/17 155 lb (70.3 kg)   Diet: general Exercise: rarely, none  Patient Active Problem List   Diagnosis Date Noted  . IFG (impaired fasting glucose) 11/10/2017    Priority: High  . 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 Coronary artery disease     Priority: High  . Peripheral vascular disease with claudication ([place])     Priority: High  . Benign hypertensive heart disease without heart failure 05/18/2011    Priority: High  . History of abdominal aortic aneurysm repair 05/18/2011    Priority: High  . Mixed hyperlipidemia     Priority: High  . DJD (degenerative joint disease), lumbosacral 11/10/2017    Priority: Medium  . Fibromyalgia 11/10/2017    Priority: Medium  . GERD (gastroesophageal reflux disease) 11/10/2017    Priority: Medium  . Hormone replacement therapy (HRT) 11/10/2017    Priority: Medium  . Fatty liver 11/10/2017    Priority: Medium  . Diverticulosis 09/19/2012    Priority: Medium  . History of renal artery stenosis 05/18/2011    Priority: Medium  . Dyshidrotic eczema 11/10/2017    Priority: Low  . Osteopenia 11/10/2017    Priority: Low  . Insomnia  11/10/2017    Priority: Low  . Does use hearing aid 11/10/2017    Priority: Low   Health Maintenance  Topic Date Due  . MAMMOGRAM  08/22/2018  . COLONOSCOPY  09/20/2019  . TETANUS/TDAP  08/09/2022  . INFLUENZA VACCINE  Completed  . Hepatitis C Screening  Completed  . PNA vac Low Risk Adult  Completed   Immunization History  Administered Date(s) Administered  . Influenza, Seasonal, Injecte, Preservative Fre 09/21/2014, 09/06/2015, 07/21/2016  . Influenza-Unspecified 08/10/2017  . PPD Test 09/13/1979  . Pneumococcal Conjugate-13 12/31/2016  . Pneumococcal Polysaccharide-23 12/16/2014  . Td 05/13/2008  . Tdap 08/09/2012  . Zoster 11/03/2010   We updated and reviewed the patient's past history in detail and it is documented below. Allergies: Patient is allergic to anticoagulant compound; morphine and related; bextra [valdecoxib]; hydrocod polst-cpm polst er; lipitor [atorvastatin]; mevacor [lovastatin]; plavix  [clopidogrel bisulfate]; zocor [simvastatin]; doxycycline; metoprolol; plavix [clopidogrel bisulfate]; codeine; hydrocod polst-cpm polst er; and lisinopril. 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 Family History: Patient family history includes AAA (abdominal aortic aneurysm) in her brother; Colon cancer in her paternal grandmother; Diabetes in her brother and sister; Heart attack in her brother, father, mother, and sister; Heart disease in her brother, brother, father, mother, and sister; Hyperlipidemia in her brother and sister; Hypertension in her brother and sister; Liver cancer in her sister. Social History:  Patient  reports that she quit smoking about 22 years ago. Her smoking use included cigarettes. She has a 37.00 pack-year smoking history. she has never used smokeless tobacco. She reports that she drinks about 3.6 - 6.0 oz of alcohol per week. She reports that she does not use drugs.  Review of Systems: Constitutional: negative for fever or malaise Ophthalmic: negative for photophobia, double vision or loss of vision Cardiovascular: negative for chest pain, dyspnea on exertion, or new LE swelling Respiratory: negative for SOB or persistent cough Gastrointestinal: negative for abdominal pain, change in bowel habits or melena Genitourinary: negative for dysuria or gross hematuria, no abnormal uterine bleeding or disharge Musculoskeletal: negative for new gait disturbance or muscular weakness Integumentary: negative for new or persistent rashes, no breast lumps Neurological: negative for TIA or stroke symptoms Psychiatric: negative for SI or delusions Allergic/Immunologic: negative for hives  Patient Care Team    Relationship Specialty Notifications Start End    [name], MD PCP - General Family Medicine  05/21/13   [place], Peter M, MD Consulting Physician Cardiology  05/24/17   Vicie Mutters, MD Consulting Physician Otolaryngology  11/10/17   Ladene Artist, MD Consulting Physician Gastroenterology  11/10/17   Penni Bombard, MD Consulting Physician Neurology  11/10/17   Reynold Bowen, MD Consulting Physician Endocrinology  11/10/17   Sharyne Peach, MD Consulting Physician Ophthalmology  11/10/17   Renelda Mom, DMD Consulting Physician Dentistry  11/10/17   Rolm Bookbinder, MD Consulting Physician Dermatology  01/12/18   Pa, [facility] Orthopaedics  Specialist  01/12/18     Objective  Vitals: BP 126/80   Pulse 69   Temp 97.9 F (36.6 C)   Ht 5\' 1"  (1.549 m)   Wt 157 lb (71.2 kg)   BMI 29.66 kg/m  General:  Well developed, well nourished, no acute distress  Psych:  Alert and orientedx3,normal mood and affect HEENT:  Normocephalic, atraumatic, non-icteric sclera, PERRL, oropharynx is clear without mass or exudate, supple neck without adenopathy, mass or thyromegaly Cardiovascular:  Normal S1, S2, RRR without gallop, rub or murmur, nondisplaced PMI Respiratory:  Good breath sounds bilaterally, CTAB with normal respiratory effort Gastrointestinal: normal bowel sounds, soft, non-tender, no noted masses. No HSM MSK: no deformities, contusions. Joints are without erythema or swelling. Spine and CVA region are nontender Skin:  Warm, no rashes or suspicious lesions noted Neurologic:    Mental status is normal. CN 2-11 are normal. Gross motor and sensory exams are normal. Normal gait. No tremor Breast Exam: No mass, skin retraction or nipple discharge is appreciated in either breast. No axillary adenopathy. Fibrocystic changes are not noted  Assessment  1. Annual physical exam   2. Essential hypertension   3. IFG (impaired fasting glucose)   4. Peripheral vascular disease, unspecified ([place])   5. Fatty liver   6. Mixed hyperlipidemia    7. Osteopenia, unspecified location   8. Hormone replacement therapy (HRT)      Plan  Female Wellness Visit:  Age appropriate Health Maintenance and Prevention measures were discussed with patient. Included topics are cancer screening recommendations, ways to keep healthy (see AVS) including dietary and exercise recommendations, regular eye and dental care, use of seat belts, and avoidance of moderate alcohol use and tobacco use.   BMI: discussed patient's BMI and encouraged positive lifestyle modifications to help get to or maintain a target BMI.  HM needs and immunizations were addressed and ordered. See below for orders. See HM and immunization section for updates.  Routine labs and screening tests ordered including cmp, cbc and lipids where appropriate. F/u a1c for ifg and lipids. Check renal and lytes for HTN - well controlled.   Discussed recommendations regarding Vit D and calcium supplementation (see AVS)  Follow up: Return in about 6 months (around 07/13/2018) for follow up Hypertension.   Commons side effects, risks, benefits, and alternatives for medications and treatment plan prescribed today were discussed, and the patient expressed understanding of the given instructions. Patient is instructed to call or message via MyChart if he/she has any questions or concerns regarding our treatment plan. No barriers to understanding were identified. We discussed Red Flag symptoms and signs in detail. Patient expressed understanding regarding what to do in case of urgent or emergency type symptoms.   Medication list was reconciled, printed and provided to the patient in AVS. Patient instructions and summary information was reviewed with the patient as documented in the AVS. This note was prepared with assistance of Dragon voice recognition software. Occasional wrong-word or sound-a-like substitutions may have occurred due to the inherent limitations of voice recognition software  Orders  Placed This Encounter  Procedures  . CBC with Differential/Platelet  . Comprehensive metabolic panel  . Lipid panel  . Hemoglobin A1c  . POCT urinalysis dipstick   No med changes today.

## 2018-01-13 NOTE — Progress Notes (Signed)
I have reviewed the documentation from the recent AWV done by Kim Broome; I agree with the documentation and will follow up on any recommendations or abnormal findings as suggested.  

## 2018-01-14 ENCOUNTER — Other Ambulatory Visit: Payer: Self-pay | Admitting: Cardiology

## 2018-01-16 NOTE — Telephone Encounter (Signed)
REFILL 

## 2018-01-23 ENCOUNTER — Encounter: Payer: Self-pay | Admitting: Rehabilitative and Restorative Service Providers"

## 2018-01-23 NOTE — Therapy (Signed)
[facility] [address] [place], [place], [address] Phone: [phone]   Fax:  [phone]  Patient Details  Name: Megan [name] MRN: [id number] Date of Birth: [birth year] Referring Provider:  Etter Sjogren, MD  Encounter Date: 10/06/17  PHYSICAL THERAPY DISCHARGE SUMMARY  Visits from Start of Care: 9  Current functional level related to goals / functional outcomes: PT Short Term Goals - 09/29/17 1111      PT SHORT TERM GOAL #1   Title  Pt will participate in further balance testing: FGA     Time  4    Period  Weeks    Status  Achieved      PT SHORT TERM GOAL #2   Title  Pt will report <2/10 dizziness with quick head turns and bending down to decrease risk for falls with functional activities    Baseline  Notes 2/10 with quick head motion or bend to upright.    Time  4    Period  Weeks    Status  Partially Met      PT SHORT TERM GOAL #3   Title  Pt will improve gait velocity to > or = 3.8 ft/sec to decrease falls risk in community    Baseline  4.06 ft/sec    Time  4    Period  Weeks    Status  Achieved      PT Long Term Goals - 10/06/17 1133      PT LONG TERM GOAL #1   Title  Pt will demonstrate independence with balance and vestibular HEP    Baseline  Has comprehensive home program.    Time  8    Period  Weeks    Status  Achieved      PT LONG TERM GOAL #2   Title  Pt will report 0/10 vertigo with positional testing, head turns and bending down    Baseline  significantly improved vertigo, but has variable days and intermittent symptoms.     Time  8    Period  Weeks    Status  Partially Met      PT LONG TERM GOAL #3   Title  Pt will demonstrate decreased falls risk as indicated by increase in FGA score by 4 points    Baseline  25/30 improved to 30/30 10/06/2017    Time  8    Period  Weeks    Status  Achieved      PT LONG TERM GOAL #5   Title  Pt will report increased confidence with balance  as indicated by increase in ABC score by 15%    Baseline  Improved 40% since evaluation.    Time  8    Period  Weeks    Status  Achieved         Remaining deficits: Patient showed significant progress with physical therapy.  PT completed last session in NOvember.  Patient was to call if habituation exercises did not treat trace amount of vertigo present at last session.  She did not f/u by phone.   Education / Equipment: Home program.  Plan: Patient agrees to discharge.  Patient goals were partially met. Patient is being discharged due to meeting the stated rehab goals.  ?????     Thank you for the referral of this patient. Rudell Cobb, MPT   Megan [name] 01/23/2018, 10:46 AM  [facility] [address] [place], [place], [address] Phone: [phone]  Fax:  [phone]

## 2018-02-07 ENCOUNTER — Telehealth: Payer: Self-pay | Admitting: Diagnostic Neuroimaging

## 2018-02-07 NOTE — Telephone Encounter (Signed)
Spoke to pt and relayed message below.   She was appreciative.

## 2018-02-07 NOTE — Telephone Encounter (Signed)
Spoke to Megan [name] and she stated that she had when in to see Dr. Leta Baptist numbness/ tingling in Lower lip from dental surgery.  She has since her 5 implants and the imbuccment placed back in 09/2017 her total lower lip has numbness and tingling.  She is asking if there is something that can be done about this?  She was seen for migraines, which are now gone, but this numbness has persisted.  Please advise.  Thanks

## 2018-02-07 NOTE — Telephone Encounter (Signed)
No further treatment available for post-dental procedure numbness from my standpoint. -VRP

## 2018-02-07 NOTE — Telephone Encounter (Signed)
Pt is having increased jaw pain. Pt did not want to schedule appt before speak with RN. Please call to advise

## 2018-02-08 ENCOUNTER — Ambulatory Visit
Admission: RE | Admit: 2018-02-08 | Discharge: 2018-02-08 | Disposition: A | Discharge status: Home / Self Care | Payer: Medicare Other | Source: Ambulatory Visit | Attending: Nurse Practitioner | Admitting: Nurse Practitioner

## 2018-02-08 DIAGNOSIS — L400 Psoriasis vulgaris: Secondary | ICD-10-CM | POA: Diagnosis not present

## 2018-02-08 DIAGNOSIS — N6489 Other specified disorders of breast: Secondary | ICD-10-CM

## 2018-02-08 DIAGNOSIS — R928 Other abnormal and inconclusive findings on diagnostic imaging of breast: Secondary | ICD-10-CM | POA: Diagnosis not present

## 2018-02-13 ENCOUNTER — Other Ambulatory Visit: Payer: Self-pay

## 2018-02-13 MED ORDER — LOSARTAN POTASSIUM-HCTZ 100-25 MG PO TABS: 1.0000 | ORAL_TABLET | Freq: Every day | ORAL | 3 refills | Status: DC

## 2018-03-01 DIAGNOSIS — H1013 Acute atopic conjunctivitis, bilateral: Secondary | ICD-10-CM | POA: Diagnosis not present

## 2018-03-14 ENCOUNTER — Telehealth: Payer: Self-pay | Admitting: Cardiology

## 2018-03-14 DIAGNOSIS — E782 Mixed hyperlipidemia: Secondary | ICD-10-CM

## 2018-03-14 DIAGNOSIS — E119 Type 2 diabetes mellitus without complications: Secondary | ICD-10-CM

## 2018-03-14 DIAGNOSIS — I119 Hypertensive heart disease without heart failure: Secondary | ICD-10-CM

## 2018-03-14 NOTE — Telephone Encounter (Signed)
Returned call to patient left message on personal voice mail I will mail lab orders to have done 3 to 5 days before her July appointment with Dr.Jordan.

## 2018-03-14 NOTE — Telephone Encounter (Signed)
New Message:     Pt is scheduled to see Dr Martinique on 06-19-18.She would like to have her lab work a week or two prior to her appointment.Would you please order this for her.

## 2018-04-12 DIAGNOSIS — D692 Other nonthrombocytopenic purpura: Secondary | ICD-10-CM | POA: Diagnosis not present

## 2018-04-12 DIAGNOSIS — L308 Other specified dermatitis: Secondary | ICD-10-CM | POA: Diagnosis not present

## 2018-04-12 DIAGNOSIS — D485 Neoplasm of uncertain behavior of skin: Secondary | ICD-10-CM | POA: Diagnosis not present

## 2018-04-12 DIAGNOSIS — L989 Disorder of the skin and subcutaneous tissue, unspecified: Secondary | ICD-10-CM | POA: Diagnosis not present

## 2018-04-12 DIAGNOSIS — L723 Sebaceous cyst: Secondary | ICD-10-CM | POA: Diagnosis not present

## 2018-04-12 DIAGNOSIS — L814 Other melanin hyperpigmentation: Secondary | ICD-10-CM | POA: Diagnosis not present

## 2018-04-17 DIAGNOSIS — M25552 Pain in left hip: Secondary | ICD-10-CM | POA: Diagnosis not present

## 2018-04-17 DIAGNOSIS — M25551 Pain in right hip: Secondary | ICD-10-CM | POA: Diagnosis not present

## 2018-04-17 DIAGNOSIS — M7062 Trochanteric bursitis, left hip: Secondary | ICD-10-CM | POA: Diagnosis not present

## 2018-04-17 DIAGNOSIS — M7061 Trochanteric bursitis, right hip: Secondary | ICD-10-CM | POA: Diagnosis not present

## 2018-04-28 ENCOUNTER — Telehealth: Payer: Self-pay | Admitting: Diagnostic Neuroimaging

## 2018-04-28 NOTE — Telephone Encounter (Signed)
I was called by Dr. Romie Minus, regarding patient.  Patient has now developed right upper lip numbness and burning sensation.  No interval surgeries or procedures.  I will see patient back in follow-up and consider trial of neuropathic pain medication such as gabapentin.  Penni Bombard, MD 06/04/5283, 13:24 AM Certified in Neurology, Neurophysiology and Neuroimaging  [facility] [address] [place], [place] [address] [phone]

## 2018-04-28 NOTE — Telephone Encounter (Signed)
Spoke with patient and schedule her FU for 05/09/18 pre Dr Leta Baptist. She understands to arrive 30 minutes early to check in. She verbalized appreciation.

## 2018-04-28 NOTE — Telephone Encounter (Signed)
Pt dr Carollee Herter called earlier and spoke with Dr Leta Baptist according to pt.  Pt states that she needs to see Dr Leta Baptist.  Pt aware of 1st available appointment showing as 11-04.  Pt states she really cant wait that long.  Pt has accepted appointment and is on wait list.  Pt is asking if there is anyway that she can see Dr Leta Baptist earlier to please call her

## 2018-05-09 ENCOUNTER — Encounter: Payer: Self-pay | Admitting: Diagnostic Neuroimaging

## 2018-05-09 ENCOUNTER — Ambulatory Visit: Payer: Medicare Other | Admitting: Diagnostic Neuroimaging

## 2018-05-09 VITALS — BP 127/86 | HR 64 | Ht 61.0 in | Wt 158.4 lb

## 2018-05-09 DIAGNOSIS — G43109 Migraine with aura, not intractable, without status migrainosus: Secondary | ICD-10-CM | POA: Diagnosis not present

## 2018-05-09 DIAGNOSIS — R200 Anesthesia of skin: Secondary | ICD-10-CM

## 2018-05-09 MED ORDER — GABAPENTIN 300 MG PO CAPS: 300.0000 mg | ORAL_CAPSULE | Freq: Two times a day (BID) | ORAL | 6 refills | Status: DC

## 2018-05-09 NOTE — Progress Notes (Signed)
[facility]  PATIENT: Megan [name] DOB: August 03, 1946  REFERRING CLINICIAN: S Rehm HISTORY FROM: patient  REASON FOR VISIT: new consult / follow up    HISTORICAL  CHIEF COMPLAINT:  Chief Complaint  Patient presents with  . Migraine    rm 6, "never filled Topiramate; doing fine now"- no headaches; new problem of numbness in lower lip"  . Follow-up    HISTORY OF PRESENT ILLNESS:   UPDATE (05/09/18, VRP): Since last visit, doing well and migraine resolved on their own. No alleviating or aggravating factors.   Now with new onset of bilateral upper lip burning sensation and increased lower lip tingling since new dentures (upper and lower) since Jan 2019. Upper lip burning worse with patient has dentures in place, and slightly reduced when dentures taken out over night. Symptoms initially improving with denture adjustments, but now symptoms are slightly worse in last 2-3 weeks.   PRIOR HPI (08/10/17): 72 year old female here for evaluation of headaches.  Patient had onset of headaches around age 23 years old, sometimes preceded by seeing wavy lines of water. Headaches would be unilateral or bilateral, sometimes associated with unilateral hemibody numbness, sometimes a fascia, with headaches lasting up to one dated time. Patient had one or 2 headaches per year. After she had a hysterectomy in 1992 her headaches significant improved. Patient was never officially diagnosed with migraine headaches but suspected that she had migraines due to her sister's history of migraines and her paternal grandmother's history of migraines. Patient never tried prescription migraine medication.  06/15/2017 patient had cold sensation and chills. She then had headache which continued until the next day. This reminded her of her previous headaches earlier in life. Headache recurred another day later and lasted for 5 weeks continuously. She had 2 episodes of visual aura. She then had headaches from  August 20 until the 30th. She had another headache from September 2 of September 5. No nausea vomiting. No photophobia or phonophobia. She has had off-balance sensation. She has had left-sided headaches ranging from 3 out of 10 in severity. She has tried Midrin and Fioricet without relief. She is tried ibuprofen and Tylenol without relief.  Initially patient cannot identify any triggering factors. However 1 week prior to onset of headaches she had a tick bite. Also starting in May 2018 through July 2018, patient had significant dental and oral surgery procedures including 5 oral surgery implants as well as a bone spur removal. She has been left with residual post surgical numbness in her right lower lip and chin.  Also in the end of July 2018 patient had sudden onset of hearing loss in her right ear. No other associated symptoms. Patient went to ENT for evaluation. Patient was treated empirically with prednisone, acyclovir and doxycycline. Her hearing loss has significantly improved.    REVIEW OF SYSTEMS: Full 14 system review of systems performed and negative with exception of: as per HPI.   ALLERGIES: Allergies  Allergen Reactions  . Anticoagulant Compound Itching and Other (See Comments)    Hands and feet tingle and itch. Hands and feet tingle and itch.  . Morphine And Related Nausea Only and Itching  . Bextra [Valdecoxib] Other (See Comments)    Increased lft's Increased lft's  . Hydrocod Polst-Cpm Polst Er Rash  . Lipitor [Atorvastatin] Other (See Comments)    Leg weakness Leg weakness  . Mevacor [Lovastatin] Other (See Comments)    Muscle weakness Muscle weakness  . Plavix  [Clopidogrel Bisulfate] Itching and Other (  See Comments)    Hands and feet tingle and itch.  . Zocor [Simvastatin] Other (See Comments)    Bones hurt Bones hurt   . Doxycycline Diarrhea    N/v/d  . Metoprolol Other (See Comments)    Hair loss  . Plavix [Clopidogrel Bisulfate] Other (See Comments)     Itching of hands and feet  . Codeine Itching and Rash    Hyper-active  . Hydrocod Polst-Cpm Polst Er Rash  . Lisinopril Cough    HOME MEDICATIONS: Outpatient Medications Prior to Visit  Medication Sig Dispense Refill  . ALPRAZolam (XANAX) 0.25 MG tablet Take 1 tablet (0.25 mg total) by mouth at bedtime as needed. 90 tablet 1  . amLODipine (NORVASC) 5 MG tablet Take 1 tablet (5 mg total) by mouth daily. 90 tablet 1  . aspirin 325 MG tablet Take 325 mg by mouth daily.    [name] atorvastatin (LIPITOR) 40 MG tablet TAKE 1 TABLET BY MOUTH DAILY AT 6 PM 90 tablet 1  . augmented betamethasone dipropionate (DIPROLENE-AF) 0.05 % cream Apply 1 application topically daily as needed. For hands  1  . ciclopirox (LOPROX) 0.77 % cream Apply topically as needed.    . clindamycin (CLEOCIN T) 1 % lotion Apply topically as needed.    . clobetasol cream (TEMOVATE) 6.81 % Apply 1 application topically 2 (two) times daily as needed.    . cycloSPORINE (RESTASIS) 0.05 % ophthalmic emulsion Place 1 drop into both eyes at bedtime.    [name] estradiol (ESTRACE) 0.5 MG tablet Take 1 tablet (0.5 mg total) by mouth daily. 90 tablet 3  . hydrALAZINE (APRESOLINE) 10 MG tablet TAKE 1 TABLET BY MOUTH 3 TIMES A DAY 270 tablet 2  . KLOR-CON 10 10 MEQ tablet TAKE 2 TABLET BY MOUTH TWICE A DAY 120 tablet 6  . losartan-hydrochlorothiazide (HYZAAR) 100-25 MG tablet Take 1 tablet by mouth daily. 90 tablet 3  . Multiple Vitamin (MULTIVITAMIN) tablet Take 1 tablet by mouth daily.    . nitroGLYCERIN (NITROSTAT) 0.4 MG SL tablet Place 1 tablet (0.4 mg total) under the tongue every 5 (five) minutes as needed for chest pain. 25 tablet PRN  . omeprazole (PRILOSEC) 40 MG capsule TAKE 1 CAPSULE (40 MG TOTAL) BY MOUTH 2 (TWO) TIMES DAILY. 180 capsule 1  . tretinoin (RETIN-A) 0.1 % cream Apply 1 application topically every other day.    . Vitamin D, Ergocalciferol, 2000 units CAPS Take 1 capsule by mouth daily.      No facility-administered  medications prior to visit.     PAST MEDICAL HISTORY: Past Medical History:  Diagnosis Date  . AAA (abdominal aortic aneurysm) ([place])    a. s/p stent grafting in 2009.  . Adenomatous colon polyp 05/2001  . Adenomatous duodenal polyp   . Allergy   . Anginal pain ([place])   . COPD (chronic obstructive pulmonary disease) ([place])   . Coronary artery disease    a. s/p CABG in 1995;  b. 05/2013 Neg MV, EF 82%;  c. 06/2013 Cath: LM nl, LAD 40-50p, LCX nl, RCA 80p/133m, VG->RCA->PDA->PLSA min irregs, LIMA->LAD atretic, vigorous LV fxn.  . Diabetes mellitus without complication (HCC)    history of, resolved. 2017  . Diverticulosis   . Eczema, dyshidrotic   . Eczema, dyshidrotic 1986   Dr. Mathis Fare  . Fibromyalgia   . GERD (gastroesophageal reflux disease)   . Hyperlipidemia   . Hypertension   . Migraine   . Myocardial infarction ([place])   . Osteoarthritis   .  Peripheral arterial disease (HCC)    left leg diagnosed by Dr Mare Ferrari  . Renal artery stenosis ([place])    a. 2008 s/p PTA    PAST SURGICAL HISTORY: Past Surgical History:  Procedure Laterality Date  . ABDOMINAL AORTAGRAM N/A 03/06/2014   Procedure: ABDOMINAL AORTAGRAM;  Surgeon: Rosetta Posner, MD;  Location: [facility] CATH LAB;  Service: Cardiovascular;  Laterality: N/A;  . ABDOMINAL AORTIC ANEURYSM REPAIR  2009   stenting  . ABDOMINAL HYSTERECTOMY     Total, 1992  . BREAST BIOPSY    . CARDIAC CATHETERIZATION    . CARDIOVASCULAR STRESS TEST  11/11/2009   normal study  . CARPAL TUNNEL RELEASE  08/1993   left wrist  . CARPAL TUNNEL RELEASE  01/1997   right  . CORONARY ARTERY BYPASS GRAFT  07/1994   Triple by Dr Ceasar Mons  . dental implants    . HAND SURGERY Right 09/2002   Right hand pulley release  . LEFT HEART CATHETERIZATION WITH CORONARY/GRAFT ANGIOGRAM N/A 06/11/2013   Procedure: LEFT HEART CATHETERIZATION WITH [name];  Surgeon: Thayer Headings, MD;  Location: [facility] CATH LAB;  Service: Cardiovascular;  Laterality:  N/A;  . MULTIPLE TOOTH EXTRACTIONS  2000   All upper teeth.  Has full dneture.   [name] RENAL ARTERY STENT  2008  . thyroid cyst apiration  05/1997 and 07/1998  . TONSILECTOMY, ADENOIDECTOMY, BILATERAL MYRINGOTOMY AND TUBES  1989  . TOTAL ABDOMINAL HYSTERECTOMY  1992    FAMILY HISTORY: Family History  Problem Relation Age of Onset  . Heart disease Mother   . Heart attack Mother   . Heart disease Father        before age 1  . Heart attack Father   . Heart disease Sister        before age 57  . Diabetes Sister   . Hyperlipidemia Sister   . Hypertension Sister   . Heart attack Sister   . Liver cancer Sister   . Breast cancer Sister   . Heart disease Brother   . Diabetes Brother   . Hyperlipidemia Brother   . Hypertension Brother   . Heart attack Brother   . AAA (abdominal aortic aneurysm) Brother   . Colon cancer Paternal Grandmother   . Heart disease Brother   . Pancreatic cancer Neg Hx   . Rectal cancer Neg Hx   . Stomach cancer Neg Hx   . Esophageal cancer Neg Hx     SOCIAL HISTORY:  Social History   Socioeconomic History  . Marital status: Married    Spouse name: Not on file  . Number of children: Not on file  . Years of education: Not on file  . Highest education level: Not on file  Occupational History  . Not on file  Social Needs  . Financial resource strain: Not on file  . Food insecurity:    Worry: Not on file    Inability: Not on file  . Transportation needs:    Medical: Not on file    Non-medical: Not on file  Tobacco Use  . Smoking status: Former Smoker    Packs/day: 1.00    Years: 37.00    Pack years: 37.00    Types: Cigarettes    Last attempt to quit: 12/07/1995    Years since quitting: 22.4  . Smokeless tobacco: Never Used  Substance and Sexual Activity  . Alcohol use: Yes    Alcohol/week: 3.6 - 6.0 oz    Types: 6 -  10 Glasses of wine per week    Comment: 2-3 glasses daily  . Drug use: No  . Sexual activity: Not Currently    Partners:  Male  Lifestyle  . Physical activity:    Days per week: Not on file    Minutes per session: Not on file  . Stress: Not on file  Relationships  . Social connections:    Talks on phone: Not on file    Gets together: Not on file    Attends religious service: Not on file    Active member of club or organization: Not on file    Attends meetings of clubs or organizations: Not on file    Relationship status: Not on file  . Intimate partner violence:    Fear of current or ex partner: Not on file    Emotionally abused: Not on file    Physically abused: Not on file    Forced sexual activity: Not on file  Other Topics Concern  . Not on file  Social History Narrative   Lives at home with husband.  Is retired.  Education 4 yrs of college.  No children.   Caffiene 2 cups daily.      PHYSICAL EXAM  GENERAL EXAM/CONSTITUTIONAL: Vitals:  Vitals:   05/09/18 0930  BP: 127/86  Pulse: 64  Weight: 158 lb 6.4 oz (71.8 kg)  Height: 5\' 1"  (1.549 m)   Body mass index is 29.93 kg/m. No exam data present  Patient is in no distress; well developed, nourished and groomed; neck is supple  CARDIOVASCULAR:  Examination of carotid arteries is normal; no carotid bruits  Regular rate and rhythm, no murmurs  Examination of peripheral vascular system by observation and palpation is normal  EYES:  Ophthalmoscopic exam of optic discs and posterior segments is normal; no papilledema or hemorrhages  MUSCULOSKELETAL:  Gait, strength, tone, movements noted in Neurologic exam below  NEUROLOGIC: MENTAL STATUS:  No flowsheet data found.  awake, alert, oriented to person, place and time  recent and remote memory intact  normal attention and concentration  language fluent, comprehension intact, naming intact,   fund of knowledge appropriate  CRANIAL NERVE:   2nd - no papilledema on fundoscopic exam  2nd, 3rd, 4th, 6th - pupils equal and reactive to light, visual fields full to  confrontation, extraocular muscles intact, no nystagmus  5th - facial sensation symmetric  7th - facial strength symmetric  8th - hearing intact  9th - palate elevates symmetrically, uvula midline  11th - shoulder shrug symmetric  12th - tongue protrusion midline  MOTOR:   normal bulk and tone, full strength in the BUE, BLE  SENSORY:   normal and symmetric to light touch, temperature, vibration  COORDINATION:   finger-nose-finger, fine finger movements normal  REFLEXES:   deep tendon reflexes TRACE and symmetric  GAIT/STATION:   narrow based gait; DIFF WITH TANDEM    DIAGNOSTIC DATA (LABS, IMAGING, TESTING) - I reviewed patient records, labs, notes, testing and imaging myself where available.  Lab Results  Component Value Date   WBC 9.0 01/13/2018   HGB 15.1 01/13/2018   HCT 43.7 01/13/2018   MCV 87.9 01/13/2018   PLT 233 01/13/2018      Component Value Date/Time   NA 139 01/13/2018 1031   NA 141 06/23/2017 0742   K 3.9 01/13/2018 1031   CL 102 01/13/2018 1031   CO2 30 01/13/2018 1031   GLUCOSE 118 (H) 01/13/2018 1031   BUN 14 01/13/2018 1031  BUN 14 06/23/2017 0742   CREATININE 0.71 01/13/2018 1031   CREATININE 0.71 06/29/2016 1057   CALCIUM 9.7 01/13/2018 1031   PROT 6.5 01/13/2018 1031   PROT 6.2 06/23/2017 0742   ALBUMIN 4.2 01/13/2018 1031   ALBUMIN 4.3 06/23/2017 0742   AST 56 (H) 01/13/2018 1031   ALT 59 (H) 01/13/2018 1031   ALKPHOS 71 01/13/2018 1031   BILITOT 0.9 01/13/2018 1031   BILITOT 0.5 06/23/2017 0742   GFRNONAA 75 06/23/2017 0742   GFRAA 86 06/23/2017 0742   Lab Results  Component Value Date   CHOL 154 01/13/2018   HDL 50.00 01/13/2018   LDLCALC 65 01/13/2018   LDLDIRECT 87.4 12/09/2014   TRIG 192.0 (H) 01/13/2018   CHOLHDL 3 01/13/2018   Lab Results  Component Value Date   HGBA1C 5.8 01/13/2018   No results found for: VITAMINB12 Lab Results  Component Value Date   TSH 2.950 11/16/2014    07/03/17 MRI brain  and IAC [I reviewed images myself and agree with interpretation. -VRP]  - Unremarkable cranial MRI. No retrocochlear lesion is identified. No abnormality is seen which might contribute to headache. - Mild cerebral and cerebellar atrophy.  - Mild subcortical and periventricular T2 and FLAIR hyperintensities, likely chronic microvascular ischemic change. - Tiny focus chronic hemorrhage in the LEFT frontal subcortical white matter, likely sequelae of hypertension.    ASSESSMENT AND PLAN  72 y.o. year old female here with history of headaches since age 37 years old, likely migraine with aura. Now with recurrence of headaches in July 2018, one week after a tick bite and 1-2 months following significant oral and dental surgeries. Also with sudden onset hearing loss, possibly viral or inflammatory, which has improved.  Regarding patient's headaches these are likely migraine with aura. These may have been aggravated by the above factors. Now resolved.   Patient also having some generalized balance difficulty, which may be related to her hearing loss issue. Did well with PT.   MRI of the brain was reviewed. She has some chronic changes related to hypertension but no acute findings.  Now with new upper lip burning and increased lower lip tingling since new dentures in Jan 2019, worsening last few weeks.    Dx:  1. Lip numbness   2. Migraine with aura and without status migrainosus, not intractable      PLAN:  UPPER AND LOWER LIP NUMBNESS (may be related to new dentures) - trial of gabapentin 300mg  at bedtime; may increase to twice a day after 2 weeks  MIGRAINE WITH AURA (resolved) - use tylenol / ibuprofen as needed for breakthrough headache  GAIT DIFFICULTY - continue PT exercises  Meds ordered this encounter  Medications  . gabapentin (NEURONTIN) 300 MG capsule    Sig: Take 1 capsule (300 mg total) by mouth 2 (two) times daily.    Dispense:  60 capsule    Refill:  6   Return in  about 6 months (around 11/08/2018) for with NP or [facility].    Penni Bombard, MD 03/11/9628, 52:84 AM Certified in Neurology, Neurophysiology and Neuroimaging  [facility] [address] [place], [place] [address] [phone]

## 2018-05-09 NOTE — Patient Instructions (Signed)
UPPER AND LOWER LIP NUMBNESS (may be related to new dentures) - trial of gabapentin 300mg  at bedtime; may increase to twice a day after 2 weeks  MIGRAINE WITH AURA (resolved) - use tylenol / ibuprofen as needed for breakthrough headache  GAIT DIFFICULTY - continue PT exercises

## 2018-05-18 ENCOUNTER — Other Ambulatory Visit: Payer: Self-pay | Admitting: Cardiology

## 2018-05-18 DIAGNOSIS — F419 Anxiety disorder, unspecified: Secondary | ICD-10-CM

## 2018-05-19 NOTE — Telephone Encounter (Signed)
OK to refill Xanax  Xavian Hardcastle MD, FACC  

## 2018-05-29 DIAGNOSIS — H9121 Sudden idiopathic hearing loss, right ear: Secondary | ICD-10-CM | POA: Diagnosis not present

## 2018-05-29 DIAGNOSIS — H903 Sensorineural hearing loss, bilateral: Secondary | ICD-10-CM | POA: Diagnosis not present

## 2018-05-30 ENCOUNTER — Ambulatory Visit: Payer: Medicare Other | Admitting: Family

## 2018-05-30 ENCOUNTER — Encounter: Payer: Self-pay | Admitting: Family

## 2018-05-30 ENCOUNTER — Ambulatory Visit (HOSPITAL_COMMUNITY)
Admission: RE | Admit: 2018-05-30 | Discharge: 2018-05-30 | Disposition: A | Discharge status: Home / Self Care | Payer: Medicare Other | Source: Ambulatory Visit | Attending: Family | Admitting: Family

## 2018-05-30 VITALS — BP 139/68 | HR 65 | Temp 97.7°F | Resp 18 | Ht 61.0 in | Wt 159.0 lb

## 2018-05-30 DIAGNOSIS — Z95828 Presence of other vascular implants and grafts: Secondary | ICD-10-CM

## 2018-05-30 DIAGNOSIS — I714 Abdominal aortic aneurysm, without rupture, unspecified: Secondary | ICD-10-CM

## 2018-05-30 NOTE — Progress Notes (Signed)
[facility] [place]  CC: Follow up s/p Endovascular Repair of Abdominal Aortic Aneurysm and iliac artery stenosis   History of Present Illness  Megan [name] is a 72 y.o. ([birth year]) female who is status post stent graft repair abdominal aortic aneurysm in 2009 by [name].  She developed left iliac artery stenosis and subsequently underwent left iliac angioplasty and stenting in 2015. She reports occasionally having very mild claudication symptoms in her left which are not limiting to her.  Her bilateral pedal pulses are palpable.   She had a flareup of chronic diverticulitis and underwent CT scan on 04/06/2017. [name] reviewed this at pt visit on 05-24-17. She was scheduled for ultrasound that day but it was canceled since she had a recent CT scan. She had no symptoms referable to her aneurysm and no new coronary disease .  [name] last evaluated pt on 05-24-17. At that time CT scan of her abdomen from 04/06/2017 showed slightly diminished maximal aneurysm diameter to 4.4.1 centimeters. Stable overall,  continue her walking program, return in one year with ultrasound follow-up.  A couple of weeks ago she started having left buttock pain that radiates distally to her leg, more so when seated.  She has known lumbar spine issues.   She denies any known hx of stroke or TIA. She has a hx of migraines in which sh became aphasic.    Diabetic: Yes, states since she lost weight her A1C has been normal since 2017.  Tobacco use: former smoker, quit in 1997, 2 years after her CABG   Past Medical History:  Diagnosis Date  . AAA (abdominal aortic aneurysm) ([place])    a. s/p stent grafting in 2009.  . Adenomatous colon polyp 05/2001  . Adenomatous duodenal polyp   . Allergy   . Anginal pain ([place])   . COPD (chronic obstructive pulmonary disease) ([place])   . Coronary artery disease    a. s/p CABG in 1995;  b. 05/2013 Neg MV, EF 82%;  c. 06/2013 Cath: LM nl, LAD 40-50p, LCX nl,  RCA 80p/118m, VG->RCA->PDA->PLSA min irregs, LIMA->LAD atretic, vigorous LV fxn.  . Diabetes mellitus without complication (HCC)    history of, resolved. 2017  . Diverticulosis   . Eczema, dyshidrotic   . Eczema, dyshidrotic 1986   Dr. Mathis Fare  . Fibromyalgia   . GERD (gastroesophageal reflux disease)   . Hyperlipidemia   . Hypertension   . Migraine   . Myocardial infarction ([place])   . Osteoarthritis   . Peripheral arterial disease (HCC)    left leg diagnosed by Dr Mare Ferrari  . Renal artery stenosis ([place])    a. 2008 s/p PTA   Past Surgical History:  Procedure Laterality Date  . ABDOMINAL AORTAGRAM N/A 03/06/2014   Procedure: ABDOMINAL AORTAGRAM;  Surgeon: Rosetta Posner, MD;  Location: [facility] CATH LAB;  Service: Cardiovascular;  Laterality: N/A;  . ABDOMINAL AORTIC ANEURYSM REPAIR  2009   stenting  . ABDOMINAL HYSTERECTOMY     Total, 1992  . BREAST BIOPSY    . CARDIAC CATHETERIZATION    . CARDIOVASCULAR STRESS TEST  11/11/2009   normal study  . CARPAL TUNNEL RELEASE  08/1993   left wrist  . CARPAL TUNNEL RELEASE  01/1997   right  . CORONARY ARTERY BYPASS GRAFT  07/1994   Triple by Dr Ceasar Mons  . dental implants    . HAND SURGERY Right 09/2002   Right hand pulley release  . LEFT HEART  CATHETERIZATION WITH CORONARY/GRAFT ANGIOGRAM N/A 06/11/2013   Procedure: LEFT HEART CATHETERIZATION WITH [name];  Surgeon: Thayer Headings, MD;  Location: [facility] CATH LAB;  Service: Cardiovascular;  Laterality: N/A;  . MULTIPLE TOOTH EXTRACTIONS  2000   All upper teeth.  Has full dneture.   [name] RENAL ARTERY STENT  2008  . thyroid cyst apiration  05/1997 and 07/1998  . TONSILECTOMY, ADENOIDECTOMY, BILATERAL MYRINGOTOMY AND TUBES  1989  . TOTAL ABDOMINAL HYSTERECTOMY  1992   Social History Social History   Tobacco Use  . Smoking status: Former Smoker    Packs/day: 0.00    Years: 37.00    Pack years: 0.00    Types: Cigarettes    Last attempt to quit: 12/07/1995    Years since  quitting: 22.4  . Smokeless tobacco: Never Used  Substance Use Topics  . Alcohol use: Yes    Alcohol/week: 3.6 - 6.0 oz    Types: 6 - 10 Glasses of wine per week    Comment: 2-3 glasses daily  . Drug use: No   Family History Family History  Problem Relation Age of Onset  . Heart disease Mother   . Heart attack Mother   . Heart disease Father        before age 48  . Heart attack Father   . Heart disease Sister        before age 74  . Diabetes Sister   . Hyperlipidemia Sister   . Hypertension Sister   . Heart attack Sister   . Liver cancer Sister   . Breast cancer Sister   . Heart disease Brother   . Diabetes Brother   . Hyperlipidemia Brother   . Hypertension Brother   . Heart attack Brother   . AAA (abdominal aortic aneurysm) Brother   . Colon cancer Paternal Grandmother   . Heart disease Brother   . Pancreatic cancer Neg Hx   . Rectal cancer Neg Hx   . Stomach cancer Neg Hx   . Esophageal cancer Neg Hx    Current Outpatient Medications on File Prior to Visit  Medication Sig Dispense Refill  . ALPRAZolam (XANAX) 0.25 MG tablet TAKE 1 TABLET BY MOUTH AT BEDTIME AS NEEDED 90 tablet 1  . amLODipine (NORVASC) 5 MG tablet Take 1 tablet (5 mg total) by mouth daily. 90 tablet 1  . aspirin 325 MG tablet Take 325 mg by mouth daily.    [name] atorvastatin (LIPITOR) 40 MG tablet TAKE 1 TABLET BY MOUTH DAILY AT 6 PM 90 tablet 1  . augmented betamethasone dipropionate (DIPROLENE-AF) 0.05 % cream Apply 1 application topically daily as needed. For hands  1  . ciclopirox (LOPROX) 0.77 % cream Apply topically as needed.    . clindamycin (CLEOCIN T) 1 % lotion Apply topically as needed.    . clobetasol cream (TEMOVATE) 0.10 % Apply 1 application topically 2 (two) times daily as needed.    . cycloSPORINE (RESTASIS) 0.05 % ophthalmic emulsion Place 1 drop into both eyes at bedtime.    [name] estradiol (ESTRACE) 0.5 MG tablet Take 1 tablet (0.5 mg total) by mouth daily. 90 tablet 3  . gabapentin  (NEURONTIN) 300 MG capsule Take 1 capsule (300 mg total) by mouth 2 (two) times daily. 60 capsule 6  . hydrALAZINE (APRESOLINE) 10 MG tablet TAKE 1 TABLET BY MOUTH 3 TIMES A DAY 270 tablet 2  . KLOR-CON 10 10 MEQ tablet TAKE 2 TABLET BY MOUTH TWICE A DAY 120 tablet 6  .  losartan-hydrochlorothiazide (HYZAAR) 100-25 MG tablet Take 1 tablet by mouth daily. 90 tablet 3  . Multiple Vitamin (MULTIVITAMIN) tablet Take 1 tablet by mouth daily.    . nitroGLYCERIN (NITROSTAT) 0.4 MG SL tablet Place 1 tablet (0.4 mg total) under the tongue every 5 (five) minutes as needed for chest pain. 25 tablet PRN  . omeprazole (PRILOSEC) 40 MG capsule TAKE 1 CAPSULE (40 MG TOTAL) BY MOUTH 2 (TWO) TIMES DAILY. 180 capsule 1  . tretinoin (RETIN-A) 0.1 % cream Apply 1 application topically every other day.    . Vitamin D, Ergocalciferol, 2000 units CAPS Take 1 capsule by mouth daily.      No current facility-administered medications on file prior to visit.    Allergies  Allergen Reactions  . Anticoagulant Compound Itching and Other (See Comments)    Hands and feet tingle and itch. Hands and feet tingle and itch.  . Morphine And Related Nausea Only and Itching  . Bextra [Valdecoxib] Other (See Comments)    Increased lft's Increased lft's  . Hydrocod Polst-Cpm Polst Er Rash  . Lipitor [Atorvastatin] Other (See Comments)    Leg weakness Leg weakness  . Mevacor [Lovastatin] Other (See Comments)    Muscle weakness Muscle weakness  . Plavix  [Clopidogrel Bisulfate] Itching and Other (See Comments)    Hands and feet tingle and itch.  . Zocor [Simvastatin] Other (See Comments)    Bones hurt Bones hurt   . Doxycycline Diarrhea    N/v/d  . Metoprolol Other (See Comments)    Hair loss  . Plavix [Clopidogrel Bisulfate] Other (See Comments)    Itching of hands and feet  . Codeine Itching and Rash    Hyper-active  . Hydrocod Polst-Cpm Polst Er Rash  . Lisinopril Cough     ROS: See HPI for pertinent positives  and negatives.  Physical Examination  Vitals:   05/30/18 0848  BP: 139/68  Pulse: 65  Resp: 18  Temp: 97.7 F (36.5 C)  TempSrc: Oral  SpO2: 96%  Weight: 159 lb (72.1 kg)  Height: 5\' 1"  (1.549 m)   Body mass index is 30.04 kg/m.  General: A&O x 3, WD, obese female HEENT: No gross abnormalities  Pulmonary: Sym exp, respirations are non labored, good air movement in all fields, CTAB, no rales, rhonchi, or wheezing,  Cardiac: Regular rhythm and rate, no murmur appreciated  Vascular: Vessel Right Left  Radial 2+Palpable 2+Palpable  Carotid  without bruit  without bruit  Aorta Not palpable N/A  Femoral 2+Palpable 2+Palpable  Popliteal Not palpable Not palpable  PT Not Palpable Not Palpable  DP 3+Palpable 3+Palpable   Gastrointestinal: soft, NTND, -G/R, - HSM, - palpable masses, - CVAT B. Musculoskeletal: M/S 5/5 throughout, extremities without ischemic changes. Skin: No rashes, no ulcers, no cellulitis.   Neurologic: Pain and light touch intact in extremities, Motor exam as listed above. Psychiatric: Normal thought content, mood appropriate for clinical situation.     DATA  EVAR Duplex   Current (Date: 05-30-18)  AAA sac size: 4.02 cm; Right CIA: 1.3 cm; Left CIA: not visualized  no endoleak detected  CTA Abd/Pelvis Duplex (Date: 04-06-17) Vascular/Lymphatic: Atherosclerotic calcification of the arterial vasculature. Aorto bi-iliac endograft stent graft repair with a 4.1 cm native infrarenal aortic aneurysm, stable. Scratch the stable are minimally decreased from 4.3 cm. No pathologically enlarged lymph Nodes.  Medical Decision Making  Megan [name] is a 72 y.o. female who presents s/p EVAR (Date: 2009).  She is also s/p left  iliac angioplasty and stenting in 2015 for left iliac artery stenosis that developed.    Pt is asymptomatic with stable sac size at 4.02 cm today, based on limited visualization.   I discussed with the patient the importance of  surveillance of the endograft.  The next endograft duplex will be scheduled for 12 months.  The patient will follow up with [place] in 12 months with these studies.  I emphasized the importance of maximal medical management including strict control of blood pressure, blood glucose, and lipid levels, antiplatelet agents, obtaining regular exercise, and cessation of smoking.   Thank you for allowing [place] to participate in this patient's care.  Clemon Chambers, RN, MSN, FNP-C [facility] [place] Office: [phone]  Clinic Physician: Early  05/30/2018, 9:24 AM

## 2018-05-30 NOTE — Patient Instructions (Addendum)
Before your next abdominal ultrasound:  Take two Extra-Strength Gas-X capsules at bedtime the night before the test. Take another two Extra-Strength Gas-X capsules 3 hours before the test.  Avoid gas forming foods and beverages the day before the test.     

## 2018-06-02 DIAGNOSIS — M25552 Pain in left hip: Secondary | ICD-10-CM | POA: Diagnosis not present

## 2018-06-02 DIAGNOSIS — M7062 Trochanteric bursitis, left hip: Secondary | ICD-10-CM | POA: Diagnosis not present

## 2018-06-02 DIAGNOSIS — M25551 Pain in right hip: Secondary | ICD-10-CM | POA: Diagnosis not present

## 2018-06-02 DIAGNOSIS — M7061 Trochanteric bursitis, right hip: Secondary | ICD-10-CM | POA: Diagnosis not present

## 2018-06-13 DIAGNOSIS — E119 Type 2 diabetes mellitus without complications: Secondary | ICD-10-CM | POA: Diagnosis not present

## 2018-06-13 DIAGNOSIS — E782 Mixed hyperlipidemia: Secondary | ICD-10-CM | POA: Diagnosis not present

## 2018-06-13 DIAGNOSIS — I119 Hypertensive heart disease without heart failure: Secondary | ICD-10-CM | POA: Diagnosis not present

## 2018-06-14 LAB — BASIC METABOLIC PANEL
BUN/Creatinine Ratio: 22 (ref 12–28)
BUN: 16 mg/dL (ref 8–27)
CO2: 26 mmol/L (ref 20–29)
Calcium: 9.8 mg/dL (ref 8.7–10.3)
Chloride: 103 mmol/L (ref 96–106)
Creatinine, Ser: 0.72 mg/dL (ref 0.57–1.00)
GFR calc Af Amer: 96 mL/min/{1.73_m2} (ref >59)
GFR calc non Af Amer: 83 mL/min/{1.73_m2} (ref >59)
Glucose: 102 mg/dL — ABNORMAL HIGH (ref 65–99)
Potassium: 4.4 mmol/L (ref 3.5–5.2)
Sodium: 145 mmol/L — ABNORMAL HIGH (ref 134–144)

## 2018-06-14 LAB — HEPATIC FUNCTION PANEL
ALT: 42 IU/L — ABNORMAL HIGH (ref 0–32)
AST: 33 IU/L (ref 0–40)
Albumin: 4.2 g/dL (ref 3.5–4.8)
Alkaline Phosphatase: 65 IU/L (ref 39–117)
Bilirubin Total: 0.6 mg/dL (ref 0.0–1.2)
Bilirubin, Direct: 0.19 mg/dL (ref 0.00–0.40)
Total Protein: 6.2 g/dL (ref 6.0–8.5)

## 2018-06-14 LAB — LIPID PANEL W/O CHOL/HDL RATIO
Cholesterol, Total: 182 mg/dL (ref 100–199)
HDL: 65 mg/dL (ref >39)
LDL Calculated: 83 mg/dL (ref 0–99)
Triglycerides: 169 mg/dL — ABNORMAL HIGH (ref 0–149)
VLDL Cholesterol Cal: 34 mg/dL (ref 5–40)

## 2018-06-14 LAB — HEMOGLOBIN A1C
Est. average glucose Bld gHb Est-mCnc: 123 mg/dL
Hgb A1c MFr Bld: 5.9 % — ABNORMAL HIGH (ref 4.8–5.6)

## 2018-06-17 ENCOUNTER — Other Ambulatory Visit: Payer: Self-pay | Admitting: Cardiology

## 2018-06-18 NOTE — Progress Notes (Signed)
Cardiology Office Note   Date:  06/19/2018   ID:  Megan [name], DOB February 04, 1946, MRN [id number]  PCP:  [name], MD  Cardiologist: Azaliyah Kennard [place] MD  Chief Complaint  Patient presents with  . PAD  . Coronary Artery Disease      History of Present Illness: Megan [name] is a 72 y.o. female who is seen for follow up of CAD, HTN, HL, and PAD.   She has a past history of essential hypertension and  coronary artery disease. Also has a history of dyslipidemia. She is status post coronary artery bypass graft surgery in 1995. She had a renal artery stent for renal artery stenosis in 2008. She had a stent graft of an abdominal aortic aneurysm 2009. She is s/p stenting of the left iliac. She is followed by [name] for PAD. Last CT in May 2018. She had [place] in June 2019 showing no endoleak. She has had chronic mild elevation of liver function enzymes secondary to modest alcohol intake and to fatty liver.   She was hospitalized in July 2014 and had cardiac catheterization which showed that her grafts were open. EF normal.  She was hospitalized 11/15/2014 with which he describes as an aching sensation in her chest.  She underwent a treadmill Myoview on 11/17/14 which showed no ischemia and her ejection fraction was 86%.  On follow up today she is doing well from a cardiac standpoint. She has had cortisone injections in her hip x 2. On Aleve now. Notes more leg cramps at night. No chest pain or dyspnea.   Past Medical History:  Diagnosis Date  . AAA (abdominal aortic aneurysm) ([place])    a. s/p stent grafting in 2009.  . Adenomatous colon polyp 05/2001  . Adenomatous duodenal polyp   . Allergy   . Anginal pain ([place])   . COPD (chronic obstructive pulmonary disease) ([place])   . Coronary artery disease    a. s/p CABG in 1995;  b. 05/2013 Neg MV, EF 82%;  c. 06/2013 Cath: LM nl, LAD 40-50p, LCX nl, RCA 80p/136m, VG->RCA->PDA->PLSA min irregs, LIMA->LAD atretic, vigorous LV fxn.  . Diabetes  mellitus without complication (HCC)    history of, resolved. 2017  . Diverticulosis   . Eczema, dyshidrotic   . Eczema, dyshidrotic 1986   Dr. Mathis Fare  . Fibromyalgia   . GERD (gastroesophageal reflux disease)   . Hyperlipidemia   . Hypertension   . Migraine   . Myocardial infarction ([place])   . Osteoarthritis   . Peripheral arterial disease (HCC)    left leg diagnosed by Dr Mare Ferrari  . Renal artery stenosis ([place])    a. 2008 s/p PTA    Past Surgical History:  Procedure Laterality Date  . ABDOMINAL AORTAGRAM N/A 03/06/2014   Procedure: ABDOMINAL AORTAGRAM;  Surgeon: Rosetta Posner, MD;  Location: [facility] CATH LAB;  Service: Cardiovascular;  Laterality: N/A;  . ABDOMINAL AORTIC ANEURYSM REPAIR  2009   stenting  . ABDOMINAL HYSTERECTOMY     Total, 1992  . BREAST BIOPSY    . CARDIAC CATHETERIZATION    . CARDIOVASCULAR STRESS TEST  11/11/2009   normal study  . CARPAL TUNNEL RELEASE  08/1993   left wrist  . CARPAL TUNNEL RELEASE  01/1997   right  . CORONARY ARTERY BYPASS GRAFT  07/1994   Triple by Dr Ceasar Mons  . dental implants    . HAND SURGERY Right 09/2002   Right hand pulley release  .  LEFT HEART CATHETERIZATION WITH CORONARY/GRAFT ANGIOGRAM N/A 06/11/2013   Procedure: LEFT HEART CATHETERIZATION WITH [name];  Surgeon: Thayer Headings, MD;  Location: [facility] CATH LAB;  Service: Cardiovascular;  Laterality: N/A;  . MULTIPLE TOOTH EXTRACTIONS  2000   All upper teeth.  Has full dneture.   [name] RENAL ARTERY STENT  2008  . thyroid cyst apiration  05/1997 and 07/1998  . TONSILECTOMY, ADENOIDECTOMY, BILATERAL MYRINGOTOMY AND TUBES  1989  . TOTAL ABDOMINAL HYSTERECTOMY  1992     Current Outpatient Medications  Medication Sig Dispense Refill  . ALPRAZolam (XANAX) 0.25 MG tablet TAKE 1 TABLET BY MOUTH AT BEDTIME AS NEEDED 90 tablet 1  . amLODipine (NORVASC) 5 MG tablet Take 1 tablet (5 mg total) by mouth daily. 90 tablet 1  . augmented betamethasone dipropionate  (DIPROLENE-AF) 0.05 % cream Apply 1 application topically daily as needed. For hands  1  . ciclopirox (LOPROX) 0.77 % cream Apply topically as needed.    . clindamycin (CLEOCIN T) 1 % lotion Apply topically as needed.    . clobetasol cream (TEMOVATE) 3.73 % Apply 1 application topically 2 (two) times daily as needed.    . cycloSPORINE (RESTASIS) 0.05 % ophthalmic emulsion Place 1 drop into both eyes at bedtime.    [name] estradiol (ESTRACE) 0.5 MG tablet Take 1 tablet (0.5 mg total) by mouth daily. 90 tablet 3  . gabapentin (NEURONTIN) 300 MG capsule Take 1 capsule (300 mg total) by mouth 2 (two) times daily. 60 capsule 6  . hydrALAZINE (APRESOLINE) 10 MG tablet TAKE 1 TABLET BY MOUTH 3 TIMES A DAY 270 tablet 2  . KLOR-CON 10 10 MEQ tablet TAKE 2 TABLET BY MOUTH TWICE A DAY 120 tablet 6  . losartan-hydrochlorothiazide (HYZAAR) 100-25 MG tablet Take 1 tablet by mouth daily. 90 tablet 3  . Multiple Vitamin (MULTIVITAMIN) tablet Take 1 tablet by mouth daily.    . nitroGLYCERIN (NITROSTAT) 0.4 MG SL tablet Place 1 tablet (0.4 mg total) under the tongue every 5 (five) minutes as needed for chest pain. 25 tablet PRN  . omeprazole (PRILOSEC) 40 MG capsule TAKE 1 CAPSULE (40 MG TOTAL) BY MOUTH 2 (TWO) TIMES DAILY. 180 capsule 1  . tretinoin (RETIN-A) 0.1 % cream Apply 1 application topically every other day.    . Vitamin D, Ergocalciferol, 2000 units CAPS Take 1 capsule by mouth daily.     [name] aspirin EC 81 MG tablet Take 1 tablet (81 mg total) by mouth daily. 90 tablet 3  . atorvastatin (LIPITOR) 80 MG tablet Take 1 tablet (80 mg total) by mouth daily. 90 tablet 3   No current facility-administered medications for this visit.     Allergies:   Anticoagulant compound; Morphine and related; Bextra [valdecoxib]; Hydrocod polst-cpm polst er; Lipitor [atorvastatin]; Mevacor [lovastatin]; Plavix  [clopidogrel bisulfate]; Zocor [simvastatin]; Doxycycline; Metoprolol; Plavix [clopidogrel bisulfate]; Codeine; Hydrocod  polst-cpm polst er; and Lisinopril    Social History:  The patient  reports that she quit smoking about 22 years ago. Her smoking use included cigarettes. She smoked 0.00 packs per day for 37.00 years. She has never used smokeless tobacco. She reports that she drinks about 3.6 - 6.0 oz of alcohol per week. She reports that she does not use drugs.   Family History:  The patient's family history includes AAA (abdominal aortic aneurysm) in her brother; Breast cancer in her sister; Colon cancer in her paternal grandmother; Diabetes in her brother and sister; Heart attack in her brother, father, mother,  and sister; Heart disease in her brother, brother, father, mother, and sister; Hyperlipidemia in her brother and sister; Hypertension in her brother and sister; Liver cancer in her sister.    ROS:  Please see the history of present illness.   Otherwise, review of systems are positive for none.   All other systems are reviewed and negative.    PHYSICAL EXAM: VS:  BP 128/64   Pulse 69   Ht 5' 0.5" (1.537 m)   Wt 161 lb (73 kg)   SpO2 96%   BMI 30.93 kg/m  , BMI Body mass index is 30.93 kg/m. GENERAL:  Well appearing WF in NAD HEENT:  PERRL, EOMI, sclera are clear. Oropharynx is clear. NECK:  No jugular venous distention, carotid upstroke brisk and symmetric, no bruits, no thyromegaly or adenopathy LUNGS:  Clear to auscultation bilaterally CHEST:  Unremarkable HEART:  RRR,  PMI not displaced or sustained,S1 and S2 within normal limits, no S3, no S4: no clicks, no rubs, no murmurs ABD:  Soft, nontender. BS +, no masses or bruits. No hepatomegaly, no splenomegaly EXT:  2 + pulses throughout, no edema, no cyanosis no clubbing SKIN:  Warm and dry.  No rashes NEURO:  Alert and oriented x 3. Cranial nerves II through XII intact. PSYCH:  Cognitively intact     EKG:  EKG is not done today.     Recent Labs: 01/13/2018: Hemoglobin 15.1; Platelets 233 06/13/2018: ALT 42; BUN 16; Creatinine, Ser  0.72; Potassium 4.4; Sodium 145   Labs from primary care 12/15/16: A1c 5.6%. : Cholesterol 167 Trig-209, HDL 52, LDL 73.  AST 51, ALT 65. Normal renal indices. TSH is normal. Dated 06/24/17: CRP <0.3. Glucose 120, AST 58, ALT 80, other chemistries normal.   Lipid Panel    Component Value Date/Time   CHOL 182 06/13/2018 0813   TRIG 169 (H) 06/13/2018 0813   HDL 65 06/13/2018 0813   CHOLHDL 3 01/13/2018 1031   VLDL 38.4 01/13/2018 1031   LDLCALC 83 06/13/2018 0813   LDLDIRECT 87.4 12/09/2014 0808      Wt Readings from Last 3 Encounters:  06/19/18 161 lb (73 kg)  05/30/18 159 lb (72.1 kg)  05/09/18 158 lb 6.4 oz (71.8 kg)      ASSESSMENT AND PLAN:  1.CAD status post CABG in 1995. Most recent cardiac cath in July 2014 showed no recurrent stenoses and patent grafts. Most recent treadmill Myoview 11/17/14 normal with ejection fraction 86%. She is asymptomatic. She notes that prior stress tests really haven't been very helpful. Will just monitor symptoms for now.  2. HTN with hypertensive heart disease. BP is under good control. 3. Hypercholesterolemia- LDL 83. Goal <70. Will increase lipitor to 80 mg daily. Recheck LFTs and lipids in 3 months. 4. Abnormal liver function studies with mild transaminase elevation.  Last labs stable.  5. Status post stent to right renal artery 2008. Patent by CT in May 2018.  6. Status post stent graft to abdominal aortic aneurysm 2009.  S/p iliac stent in 2016. Stable by [place] in May 7. Peripheral artery disease followed by [name]   Current medicines are reviewed at length with the patient today.  The patient does not have concerns regarding medicines.  Disposition: Continue current medication.  Recheck in 6 months for follow-up office visit.    Signed, Evangeline Utley [place] MD, [facility]    06/19/2018 3:34 PM    [place]

## 2018-06-19 ENCOUNTER — Encounter: Payer: Self-pay | Admitting: Cardiology

## 2018-06-19 ENCOUNTER — Ambulatory Visit: Payer: Medicare Other | Admitting: Cardiology

## 2018-06-19 VITALS — BP 128/64 | HR 69 | Ht 60.5 in | Wt 161.0 lb

## 2018-06-19 DIAGNOSIS — I739 Peripheral vascular disease, unspecified: Secondary | ICD-10-CM

## 2018-06-19 DIAGNOSIS — E119 Type 2 diabetes mellitus without complications: Secondary | ICD-10-CM | POA: Diagnosis not present

## 2018-06-19 DIAGNOSIS — E782 Mixed hyperlipidemia: Secondary | ICD-10-CM | POA: Diagnosis not present

## 2018-06-19 DIAGNOSIS — I714 Abdominal aortic aneurysm, without rupture, unspecified: Secondary | ICD-10-CM

## 2018-06-19 DIAGNOSIS — I25810 Atherosclerosis of coronary artery bypass graft(s) without angina pectoris: Secondary | ICD-10-CM

## 2018-06-19 MED ORDER — ASPIRIN EC 81 MG PO TBEC: 81.0000 mg | DELAYED_RELEASE_TABLET | Freq: Every day | ORAL | 3 refills | Status: DC

## 2018-06-19 MED ORDER — ATORVASTATIN CALCIUM 80 MG PO TABS: 80.0000 mg | ORAL_TABLET | Freq: Every day | ORAL | 3 refills | Status: DC

## 2018-06-19 NOTE — Patient Instructions (Signed)
Reduce ASA to 81 mg daily  Increase lipitor to 80 mg daily  Repeat lab work 3 months  I will see you in 6 months

## 2018-06-26 ENCOUNTER — Telehealth: Payer: Self-pay | Admitting: Diagnostic Neuroimaging

## 2018-06-26 NOTE — Telephone Encounter (Signed)
I spoke to pt and she had been taking the gabapentin 300mg  po bid since after the first week of starting the medication 05/09/18 for lip numbness/tingling, burning pain.  She noted improvement and was pleased.  This last week she has noted breakthru pain and is asking if this can be increased.  Please advise.

## 2018-06-26 NOTE — Telephone Encounter (Signed)
Pt requesting a call to discuss increasing the dosing for gabapentin (NEURONTIN) 300 MG capsule. Pt did not wish to discuss reasoning.

## 2018-06-26 NOTE — Telephone Encounter (Signed)
May increase gabapentin to 300mg  three times a day. -VRP

## 2018-06-27 MED ORDER — GABAPENTIN 300 MG PO CAPS: 300.0000 mg | ORAL_CAPSULE | Freq: Three times a day (TID) | ORAL | 5 refills | Status: DC

## 2018-06-27 NOTE — Telephone Encounter (Signed)
Spoke to pt and relayed that she may increase the gabapentin to 300mg  po TID.  She will do this and let us know how she does or if this does not help.  She has also noted a increase in her appetite, wondering if this normal with gabapentin.  I relayed that I was not aware.  She verbalized udnerstanding.

## 2018-06-27 NOTE — Addendum Note (Signed)
Addended by: Brandon Melnick on: 06/27/2018 08:17 AM   Modules accepted: Orders

## 2018-07-10 ENCOUNTER — Encounter: Payer: Self-pay | Admitting: Family Medicine

## 2018-07-10 ENCOUNTER — Ambulatory Visit: Payer: Medicare Other | Admitting: Family Medicine

## 2018-07-10 ENCOUNTER — Other Ambulatory Visit: Payer: Self-pay

## 2018-07-10 VITALS — BP 132/78 | HR 71 | Temp 97.8°F | Ht 60.25 in | Wt 163.6 lb

## 2018-07-10 DIAGNOSIS — I739 Peripheral vascular disease, unspecified: Secondary | ICD-10-CM | POA: Diagnosis not present

## 2018-07-10 DIAGNOSIS — E782 Mixed hyperlipidemia: Secondary | ICD-10-CM

## 2018-07-10 DIAGNOSIS — M797 Fibromyalgia: Secondary | ICD-10-CM

## 2018-07-10 DIAGNOSIS — I10 Essential (primary) hypertension: Secondary | ICD-10-CM | POA: Insufficient documentation

## 2018-07-10 DIAGNOSIS — M7581 Other shoulder lesions, right shoulder: Secondary | ICD-10-CM | POA: Diagnosis not present

## 2018-07-10 DIAGNOSIS — I119 Hypertensive heart disease without heart failure: Secondary | ICD-10-CM | POA: Diagnosis not present

## 2018-07-10 DIAGNOSIS — Z79890 Hormone replacement therapy (postmenopausal): Secondary | ICD-10-CM

## 2018-07-10 MED ORDER — TRIAMCINOLONE ACETONIDE 40 MG/ML IJ SUSP
40.0000 mg | Freq: Once | INTRAMUSCULAR | Status: AC
  Administered 2018-07-10: 40 mg via INTRA_ARTICULAR

## 2018-07-10 NOTE — Patient Instructions (Addendum)
Please return in 6 months for complete physical and AWV. Please schedule both.   Rest the right shoulder for the next 1-2 days. Ice for 10 minutes twice a day for the next few days.   If you have any questions or concerns, please don't hesitate to send me a message via MyChart or call the office at [phone]. Thank you for visiting with [place] today! It's our pleasure caring for you.

## 2018-07-10 NOTE — Progress Notes (Signed)
Subjective  CC:  Chief Complaint  Patient presents with  . Hypertension  . Leg Cramps    Bi-lateral Leg Cramping, mostly at night  . Shoulder Pain    right shoulder pain x 1 week     HPI: Megan [name] is a 72 y.o. female who presents to the office today to address the problems listed above in the chief complaint.  Hypertension f/u: Control is good . Pt reports she is doing well. taking medications as instructed, no medication side effects noted, no TIAs, no chest pain on exertion, no dyspnea on exertion, no swelling of ankles. No angina. CAD and PAD are well controlled. Has gained weight. Eating better due to "new teeth".  She denies adverse effects from his BP medications. Compliance with medication is good.   Pain is ok; on gabapentin for nerve pain due to dental implant.   HRT stable.   Leg cramps: worsening and more frequent.   PAD is stable.   Right shoulder pain x 1 week; started after pushing herself abruptly upward while leaning over banister last week. Has pain with certain mvt. No radiation or neck pain. No UE weakness.   Assessment  1. Benign hypertensive heart disease without heart failure   2. Essential hypertension   3. Peripheral vascular disease with claudication ([place])   4. Mixed hyperlipidemia   5. Fibromyalgia   6. Hormone replacement therapy (HRT)   7. Right rotator cuff tendinitis      Plan    Hypertension f/u: BP control is fairly well controlled. No change in meds  Hyperlipidemia f/u: at goal on meds  PAD and CAD are controlled per cards  S/p steroid injection into right shoulder. RICE, rest. Routine post procedure care instructions were given to patient in detail.  HRT and fibromyalgia are stable as well.      Education regarding management of these chronic disease states was given. Management strategies discussed on successive visits include dietary and exercise recommendations, goals of achieving and maintaining IBW, and lifestyle  modifications aiming for adequate sleep and minimizing stressors.   Follow up: Return in about 6 months (around 01/10/2019) for complete physical, AWV.  No orders of the defined types were placed in this encounter.  No orders of the defined types were placed in this encounter.     BP Readings from Last 3 Encounters:  07/10/18 132/78  06/19/18 128/64  05/30/18 139/68   Wt Readings from Last 3 Encounters:  07/10/18 163 lb 9.6 oz (74.2 kg)  06/19/18 161 lb (73 kg)  05/30/18 159 lb (72.1 kg)    Lab Results  Component Value Date   CHOL 182 06/13/2018   CHOL 154 01/13/2018   CHOL 153 06/23/2017   Lab Results  Component Value Date   HDL 65 06/13/2018   HDL 50.00 01/13/2018   HDL 64 06/23/2017   Lab Results  Component Value Date   LDLCALC 83 06/13/2018   LDLCALC 65 01/13/2018   LDLCALC 55 06/23/2017   Lab Results  Component Value Date   TRIG 169 (H) 06/13/2018   TRIG 192.0 (H) 01/13/2018   TRIG 172 (H) 06/23/2017   Lab Results  Component Value Date   CHOLHDL 3 01/13/2018   CHOLHDL 2.4 06/23/2017   CHOLHDL 3.2 06/29/2016   Lab Results  Component Value Date   LDLDIRECT 87.4 12/09/2014   LDLDIRECT 90.4 01/25/2014   LDLDIRECT 124.1 03/20/2012   Lab Results  Component Value Date   CREATININE 0.72 06/13/2018  BUN 16 06/13/2018   NA 145 (H) 06/13/2018   K 4.4 06/13/2018   CL 103 06/13/2018   CO2 26 06/13/2018    The 10-year ASCVD risk score Mikey Bussing DC Jr., et al., 2013) is: 31.5%   Values used to calculate the score:     Age: 20 years     Sex: Female     Is Non-Hispanic African American: No     Diabetic: Yes     Tobacco smoker: No     Systolic Blood Pressure: 811 mmHg     Is BP treated: Yes     HDL Cholesterol: 65 mg/dL     Total Cholesterol: 182 mg/dL  I reviewed the patients updated PMH, FH, and SocHx.    Patient Active Problem List   Diagnosis Date Noted  . Essential hypertension 07/10/2018    Priority: High  . IFG (impaired fasting glucose)  11/10/2017    Priority: High  . 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 Coronary artery disease     Priority: High  . Peripheral vascular disease with claudication ([place])     Priority: High  . Benign hypertensive heart disease without heart failure 05/18/2011    Priority: High  . History of abdominal aortic aneurysm repair 05/18/2011    Priority: High  . Mixed hyperlipidemia     Priority: High  . DJD (degenerative joint disease), lumbosacral 11/10/2017    Priority: Medium  . Fibromyalgia 11/10/2017    Priority: Medium  . GERD (gastroesophageal reflux disease) 11/10/2017    Priority: Medium  . Hormone replacement therapy (HRT) 11/10/2017    Priority: Medium  . Fatty liver 11/10/2017    Priority: Medium  . Diverticulosis 09/19/2012    Priority: Medium  . History of renal artery stenosis 05/18/2011    Priority: Medium  . Dyshidrotic eczema 11/10/2017    Priority: Low  . Osteopenia 11/10/2017    Priority: Low  . Does use hearing aid 11/10/2017    Priority: Low    Allergies: Anticoagulant compound; Morphine and related; Bextra [valdecoxib]; Hydrocod polst-cpm polst er; Lipitor [atorvastatin]; Mevacor [lovastatin]; Plavix  [clopidogrel bisulfate]; Zocor [simvastatin]; Doxycycline; Metoprolol; Plavix [clopidogrel bisulfate]; Codeine; Hydrocod polst-cpm polst er; and Lisinopril  Social History: Patient  reports that she quit smoking about 22 years ago. Her smoking use included cigarettes. She smoked 0.00 packs per day for 37.00 years. She has never used smokeless tobacco. She reports that she drinks about 3.6 - 6.0 oz of alcohol per week. She reports that she does not use drugs.  Current Meds  Medication Sig  . ALPRAZolam (XANAX) 0.25 MG tablet TAKE 1 TABLET BY MOUTH AT BEDTIME AS NEEDED  . amLODipine (NORVASC) 5 MG tablet TAKE 1 TABLET BY MOUTH EVERY DAY  . aspirin EC 81 MG tablet Take 1 tablet (81 mg total) by mouth daily.  [name] atorvastatin (LIPITOR) 80 MG tablet Take 1  tablet (80 mg total) by mouth daily.  [name] augmented betamethasone dipropionate (DIPROLENE-AF) 0.05 % cream Apply 1 application topically daily as needed. For hands  . ciclopirox (LOPROX) 0.77 % cream Apply topically as needed.  . clindamycin (CLEOCIN T) 1 % lotion Apply topically as needed.  . clobetasol cream (TEMOVATE) 9.14 % Apply 1 application topically 2 (two) times daily as needed.  . cycloSPORINE (RESTASIS) 0.05 % ophthalmic emulsion Place 1 drop into both eyes at bedtime.  [name] estradiol (ESTRACE) 0.5 MG tablet Take 1 tablet (0.5 mg total) by mouth daily.  [name] gabapentin (NEURONTIN)  300 MG capsule Take 1 capsule (300 mg total) by mouth 3 (three) times daily.  . hydrALAZINE (APRESOLINE) 10 MG tablet TAKE 1 TABLET BY MOUTH 3 TIMES A DAY  . KLOR-CON 10 10 MEQ tablet TAKE 2 TABLET BY MOUTH TWICE A DAY  . losartan-hydrochlorothiazide (HYZAAR) 100-25 MG tablet Take 1 tablet by mouth daily.  . nitroGLYCERIN (NITROSTAT) 0.4 MG SL tablet Place 1 tablet (0.4 mg total) under the tongue every 5 (five) minutes as needed for chest pain.  [name] omeprazole (PRILOSEC) 40 MG capsule TAKE 1 CAPSULE (40 MG TOTAL) BY MOUTH 2 (TWO) TIMES DAILY.  [name] tretinoin (RETIN-A) 0.1 % cream Apply 1 application topically every other day.  . Vitamin D, Ergocalciferol, 2000 units CAPS Take 1 capsule by mouth daily.   . [DISCONTINUED] atorvastatin (LIPITOR) 40 MG tablet TAKE 1 TABLET BY MOUTH DAILY AT 6 PM    Review of Systems: Cardiovascular: negative for chest pain, palpitations, leg swelling, orthopnea Respiratory: negative for SOB, wheezing or persistent cough Gastrointestinal: negative for abdominal pain Genitourinary: negative for dysuria or gross hematuria  Objective  Vitals: BP 132/78   Pulse 71   Temp 97.8 F (36.6 C)   Ht 5' 0.25" (1.53 m)   Wt 163 lb 9.6 oz (74.2 kg)   SpO2 95%   BMI 31.69 kg/m  General: no acute distress  Psych:  Alert and oriented, normal mood and affect HEENT:  Normocephalic, atraumatic,  supple neck  Cardiovascular:  RRR with murmur. no edema Respiratory:  Good breath sounds bilaterally, CTAB with normal respiratory effort Skin:  Warm, no rashes Neurologic:   Mental status is normal Right shoulder tender anterior, + empty can testing, neg impingment, no AC ttp, FROM active and passive.   Shoulder Injection Procedure Note  Pre-operative Diagnosis: right shoulder  Post-operative Diagnosis: same  Indications: Symptomatic relief and failed conservative measures  Anesthesia: Lidocaine 1% without epinephrine without added sodium bicarbonate  Procedure Details   Verbal consent was obtained for the procedure. Universal time out done.  The shoulder was prepped with alcohol  and the skin was anesthetized with cold spray. Using a 22 gauge needle the subacromial space was injected with 4 mL 1% lidocaine and 1 mL of triamcinolone (KENALOG) 40mg /ml under the posterior aspect of the acromion. The injection site was cleansed with topical isopropyl alcohol and a dressing was applied.  Complications:  None; patient tolerated the procedure well.   Commons side effects, risks, benefits, and alternatives for medications and treatment plan prescribed today were discussed, and the patient expressed understanding of the given instructions. Patient is instructed to call or message via MyChart if he/she has any questions or concerns regarding our treatment plan. No barriers to understanding were identified. We discussed Red Flag symptoms and signs in detail. Patient expressed understanding regarding what to do in case of urgent or emergency type symptoms.   Medication list was reconciled, printed and provided to the patient in AVS. Patient instructions and summary information was reviewed with the patient as documented in the AVS. This note was prepared with assistance of Dragon voice recognition software. Occasional wrong-word or sound-a-like substitutions may have occurred due to the inherent  limitations of voice recognition software

## 2018-07-11 ENCOUNTER — Other Ambulatory Visit: Payer: Self-pay | Admitting: Family Medicine

## 2018-07-11 DIAGNOSIS — Z1231 Encounter for screening mammogram for malignant neoplasm of breast: Secondary | ICD-10-CM

## 2018-07-13 DIAGNOSIS — M7061 Trochanteric bursitis, right hip: Secondary | ICD-10-CM | POA: Diagnosis not present

## 2018-07-13 DIAGNOSIS — M7062 Trochanteric bursitis, left hip: Secondary | ICD-10-CM | POA: Diagnosis not present

## 2018-07-20 ENCOUNTER — Other Ambulatory Visit: Payer: Self-pay | Admitting: Cardiology

## 2018-07-21 DIAGNOSIS — H1013 Acute atopic conjunctivitis, bilateral: Secondary | ICD-10-CM | POA: Diagnosis not present

## 2018-08-03 DIAGNOSIS — H2513 Age-related nuclear cataract, bilateral: Secondary | ICD-10-CM | POA: Diagnosis not present

## 2018-08-14 DIAGNOSIS — M7541 Impingement syndrome of right shoulder: Secondary | ICD-10-CM | POA: Diagnosis not present

## 2018-08-14 DIAGNOSIS — M25512 Pain in left shoulder: Secondary | ICD-10-CM | POA: Diagnosis not present

## 2018-08-22 DIAGNOSIS — M25511 Pain in right shoulder: Secondary | ICD-10-CM | POA: Diagnosis not present

## 2018-08-23 DIAGNOSIS — M7061 Trochanteric bursitis, right hip: Secondary | ICD-10-CM | POA: Diagnosis not present

## 2018-08-23 DIAGNOSIS — M7062 Trochanteric bursitis, left hip: Secondary | ICD-10-CM | POA: Diagnosis not present

## 2018-08-29 ENCOUNTER — Ambulatory Visit
Admission: RE | Admit: 2018-08-29 | Discharge: 2018-08-29 | Disposition: A | Discharge status: Home / Self Care | Payer: Medicare Other | Source: Ambulatory Visit | Attending: Family Medicine | Admitting: Family Medicine

## 2018-08-29 ENCOUNTER — Telehealth: Payer: Self-pay | Admitting: *Deleted

## 2018-08-29 DIAGNOSIS — M75101 Unspecified rotator cuff tear or rupture of right shoulder, not specified as traumatic: Secondary | ICD-10-CM | POA: Diagnosis not present

## 2018-08-29 DIAGNOSIS — M7541 Impingement syndrome of right shoulder: Secondary | ICD-10-CM | POA: Diagnosis not present

## 2018-08-29 DIAGNOSIS — Z1231 Encounter for screening mammogram for malignant neoplasm of breast: Secondary | ICD-10-CM

## 2018-08-29 NOTE — Telephone Encounter (Signed)
    [facility] Pre-operative Risk Assessment    Request for surgical clearance:  1. What type of surgery is being performed? RIGHT ROTATOR CUFF REPAIR  2. When is this surgery scheduled? TBD   3. What type of clearance is required (medical clearance vs. Pharmacy clearance to hold med vs. Both)? MEDICAL  4. Are there any medications that need to be held prior to surgery and how long? ASPIRIN 81 MG  5. Practice name and name of physician performing surgery? [facility]; DR JEFFERY BEANE 6. What is your office phone number [phone]    7.   What is your office fax number [phone]  8.   Anesthesia type (None, local, MAC, general) ?Leonia Corona 08/29/2018, 5:43 PM  _________________________________________________________________   (provider comments below)

## 2018-08-30 ENCOUNTER — Telehealth: Payer: Self-pay | Admitting: Family Medicine

## 2018-08-30 ENCOUNTER — Telehealth: Payer: Self-pay | Admitting: Cardiology

## 2018-08-30 NOTE — Telephone Encounter (Signed)
° °  Please return call to patient.  She is currently at another appt, ok to leave vcml

## 2018-08-30 NOTE — Telephone Encounter (Signed)
Follow Up:      Please return call to patient.

## 2018-08-30 NOTE — Telephone Encounter (Signed)
Forms placed in Dr. Tamela Oddi inbox for approval.   Doloris Hall,  LPN

## 2018-08-30 NOTE — Telephone Encounter (Signed)
Pt dropped off surgical clearance form to be completed by Dr. Jonni Sanger, pt would like a call back when this has been faxed back, placed in bin upfront with charge sheet.

## 2018-08-30 NOTE — Telephone Encounter (Signed)
Left message for pt to call back  °

## 2018-08-31 NOTE — Telephone Encounter (Signed)
   Primary Cardiologist: No primary care provider on file.  Chart reviewed as part of pre-operative protocol coverage. Patient was contacted 08/31/2018 in reference to pre-operative risk assessment for pending surgery as outlined below.  Megan [name] was last seen on 06/19/18 by Dr. Martinique .  Since that day, Megan [name] has done well without chest pain or SOB.  Will check with Dr. Martinique on holding asprin with her Hx of CABG, HTN, renal artery stent and stent graft in AAA since 2009 and stent to Lt iliac.    Therefore, based on ACC/AHA guidelines, the patient would be at acceptable risk for the planned procedure without further cardiovascular testing.   I will route this recommendation to the requesting party via Epic fax function and remove from pre-op pool.  Please call with questions.  Cecilie Kicks, NP 08/31/2018, 1:38 PM

## 2018-08-31 NOTE — Telephone Encounter (Signed)
See attached

## 2018-08-31 NOTE — Telephone Encounter (Signed)
Yes ok to hold ASA  [name] MD, [facility]

## 2018-09-01 NOTE — Telephone Encounter (Signed)
Pt is calling check on status of medical clearance form for right shoulder surgery

## 2018-09-01 NOTE — Telephone Encounter (Signed)
Forms sent to Orthopedic doctor, via fax.    LM  Informing patient.   Doloris Hall,  LPN

## 2018-09-08 ENCOUNTER — Telehealth: Payer: Self-pay

## 2018-09-08 NOTE — Telephone Encounter (Signed)
Patient walked in office stating Dr.Jeffrey Beane never received surgical clearance.Surgical clearance  taken to Dr.Bean's office.Dr.Jordan advised ok to hold aspirin 5 days prior to surgery.

## 2018-09-14 ENCOUNTER — Ambulatory Visit: Payer: Self-pay | Admitting: Orthopedic Surgery

## 2018-09-14 DIAGNOSIS — I739 Peripheral vascular disease, unspecified: Secondary | ICD-10-CM | POA: Diagnosis not present

## 2018-09-14 DIAGNOSIS — I714 Abdominal aortic aneurysm, without rupture: Secondary | ICD-10-CM | POA: Diagnosis not present

## 2018-09-14 DIAGNOSIS — E119 Type 2 diabetes mellitus without complications: Secondary | ICD-10-CM | POA: Diagnosis not present

## 2018-09-14 DIAGNOSIS — E782 Mixed hyperlipidemia: Secondary | ICD-10-CM | POA: Diagnosis not present

## 2018-09-14 DIAGNOSIS — I25810 Atherosclerosis of coronary artery bypass graft(s) without angina pectoris: Secondary | ICD-10-CM | POA: Diagnosis not present

## 2018-09-14 LAB — LIPID PANEL
Chol/HDL Ratio: 2.9 ratio (ref 0.0–4.4)
Cholesterol, Total: 150 mg/dL (ref 100–199)
HDL: 52 mg/dL (ref >39)
LDL Calculated: 66 mg/dL (ref 0–99)
Triglycerides: 160 mg/dL — ABNORMAL HIGH (ref 0–149)
VLDL Cholesterol Cal: 32 mg/dL (ref 5–40)

## 2018-09-14 LAB — HEPATIC FUNCTION PANEL
ALT: 45 IU/L — ABNORMAL HIGH (ref 0–32)
AST: 38 IU/L (ref 0–40)
Albumin: 4.1 g/dL (ref 3.5–4.8)
Alkaline Phosphatase: 73 IU/L (ref 39–117)
Bilirubin Total: 0.5 mg/dL (ref 0.0–1.2)
Bilirubin, Direct: 0.18 mg/dL (ref 0.00–0.40)
Total Protein: 5.8 g/dL — ABNORMAL LOW (ref 6.0–8.5)

## 2018-09-18 ENCOUNTER — Encounter (HOSPITAL_COMMUNITY): Payer: Self-pay

## 2018-09-18 NOTE — Progress Notes (Signed)
09/14/2018- noted in [place]- Lipid panel, Hepatic function panel  08/31/2018- Cardiac Clearance on chart from Vickie Epley for Dr. [name]  08/30/2018- Medical Clearance from Dr. Billey Chang on chart  12/29/2017- noted in [place]

## 2018-09-18 NOTE — Patient Instructions (Addendum)
Megan [name]  09/18/2018   Your procedure is scheduled on: Wednesday 09/27/2018  Report to [facility] Main  Entrance              Report to admitting at  0530  AM    Call this number if you have problems the morning of surgery [phone]    Remember: Do not eat food or drink liquids :After Midnight.               BRUSH YOUR TEETH MORNING OF SURGERY AND RINSE YOUR MOUTH OUT, NO CHEWING GUM CANDY OR MINTS.     Take these medicines the morning of surgery with A SIP OF WATER: Amlodipine (Norvasc), Gabapentin (Neurontin), Hydralazine (Apresoline), Omeprazole (Prilosec), Xanax if needed                                You may not have any metal on your body including hair pins and              piercings  Do not wear jewelry, make-up, lotions, powders or perfumes, deodorant             Do not wear nail polish.  Do not shave  48 hours prior to surgery.               Do not bring valuables to the hospital. [place].  Contacts, dentures or bridgework may not be worn into surgery.  Leave suitcase in the car. After surgery it may be brought to your room.                  Please read over the following fact sheets you were given: _____________________________________________________________________             [facility] - Preparing for Surgery Before surgery, you can play an important role.  Because skin is not sterile, your skin needs to be as free of germs as possible.  You can reduce the number of germs on your skin by washing with CHG (chlorahexidine gluconate) soap before surgery.  CHG is an antiseptic cleaner which kills germs and bonds with the skin to continue killing germs even after washing. Please DO NOT use if you have an allergy to CHG or antibacterial soaps.  If your skin becomes reddened/irritated stop using the CHG and inform your nurse when you arrive at Short Stay. Do not shave (including  legs and underarms) for at least 48 hours prior to the first CHG shower.  You may shave your face/neck. Please follow these instructions carefully:  1.  Shower with CHG Soap the night before surgery and the  morning of Surgery.  2.  If you choose to wash your hair, wash your hair first as usual with your  normal  shampoo.  3.  After you shampoo, rinse your hair and body thoroughly to remove the  shampoo.                           4.  Use CHG as you would any other liquid soap.  You can apply chg directly  to the skin and wash  Gently with a scrungie or clean washcloth.  5.  Apply the CHG Soap to your body ONLY FROM THE NECK DOWN.   Do not use on face/ open                           Wound or open sores. Avoid contact with eyes, ears mouth and genitals (private parts).                       Wash face,  Genitals (private parts) with your normal soap.             6.  Wash thoroughly, paying special attention to the area where your surgery  will be performed.  7.  Thoroughly rinse your body with warm water from the neck down.  8.  DO NOT shower/wash with your normal soap after using and rinsing off  the CHG Soap.                9.  Pat yourself dry with a clean towel.            10.  Wear clean pajamas.            11.  Place clean sheets on your bed the night of your first shower and do not  sleep with pets. Day of Surgery : Do not apply any lotions/deodorants the morning of surgery.  Please wear clean clothes to the hospital/surgery center.  FAILURE TO FOLLOW THESE INSTRUCTIONS MAY RESULT IN THE CANCELLATION OF YOUR SURGERY PATIENT SIGNATURE_________________________________  NURSE SIGNATURE__________________________________  ________________________________________________________________________   Adam Phenix  An incentive spirometer is a tool that can help keep your lungs clear and active. This tool measures how well you are filling your lungs with each breath.  Taking long deep breaths may help reverse or decrease the chance of developing breathing (pulmonary) problems (especially infection) following:  A long period of time when you are unable to move or be active. BEFORE THE PROCEDURE   If the spirometer includes an indicator to show your best effort, your nurse or respiratory therapist will set it to a desired goal.  If possible, sit up straight or lean slightly forward. Try not to slouch.  Hold the incentive spirometer in an upright position. INSTRUCTIONS FOR USE  1. Sit on the edge of your bed if possible, or sit up as far as you can in bed or on a chair. 2. Hold the incentive spirometer in an upright position. 3. Breathe out normally. 4. Place the mouthpiece in your mouth and seal your lips tightly around it. 5. Breathe in slowly and as deeply as possible, raising the piston or the ball toward the top of the column. 6. Hold your breath for 3-5 seconds or for as long as possible. Allow the piston or ball to fall to the bottom of the column. 7. Remove the mouthpiece from your mouth and breathe out normally. 8. Rest for a few seconds and repeat Steps 1 through 7 at least 10 times every 1-2 hours when you are awake. Take your time and take a few normal breaths between deep breaths. 9. The spirometer may include an indicator to show your best effort. Use the indicator as a goal to work toward during each repetition. 10. After each set of 10 deep breaths, practice coughing to be sure your lungs are clear. If you have an incision (the cut made at the time of surgery),  support your incision when coughing by placing a pillow or rolled up towels firmly against it. Once you are able to get out of bed, walk around indoors and cough well. You may stop using the incentive spirometer when instructed by your caregiver.  RISKS AND COMPLICATIONS  Take your time so you do not get dizzy or light-headed.  If you are in pain, you may need to take or ask for pain  medication before doing incentive spirometry. It is harder to take a deep breath if you are having pain. AFTER USE  Rest and breathe slowly and easily.  It can be helpful to keep track of a log of your progress. Your caregiver can provide you with a simple table to help with this. If you are using the spirometer at home, follow these instructions: [place] IF:   You are having difficultly using the spirometer.  You have trouble using the spirometer as often as instructed.  Your pain medication is not giving enough relief while using the spirometer.  You develop fever of 100.5 F (38.1 C) or higher. SEEK IMMEDIATE MEDICAL CARE IF:   You cough up bloody sputum that had not been present before.  You develop fever of 102 F (38.9 C) or greater.  You develop worsening pain at or near the incision site. MAKE SURE YOU:   Understand these instructions.  Will watch your condition.  Will get help right away if you are not doing well or get worse. Document Released: 04/04/2007 Document Revised: 02/14/2012 Document Reviewed: 06/05/2007 [facility] Patient Information 2014 [place], [place].   ________________________________________________________________________

## 2018-09-19 ENCOUNTER — Ambulatory Visit: Payer: Self-pay | Admitting: Orthopedic Surgery

## 2018-09-19 NOTE — H&P (Signed)
Megan [name] is an 72 y.o. female.   Chief Complaint: R shoulder pain HPI: Visit For: Follow up (to discuss right shoulder MRI) Hand Dominance: right Duration: 8 weeks Severity: pain level 4/10 Alleviating Factors: ice; NSAIDs (Aleve)  Past Medical History:  Diagnosis Date  . AAA (abdominal aortic aneurysm) ([place])    a. s/p stent grafting in 2009.  . Adenomatous colon polyp 05/2001  . Adenomatous duodenal polyp   . Allergy   . Anginal pain ([place])   . COPD (chronic obstructive pulmonary disease) ([place])   . Coronary artery disease    a. s/p CABG in 1995;  b. 05/2013 Neg MV, EF 82%;  c. 06/2013 Cath: LM nl, LAD 40-50p, LCX nl, RCA 80p/12m, VG->RCA->PDA->PLSA min irregs, LIMA->LAD atretic, vigorous LV fxn.  . Diabetes mellitus without complication (HCC)    history of, resolved. 2017  . Diverticulosis   . Eczema, dyshidrotic   . Eczema, dyshidrotic 1986   Dr. Mathis Fare  . Fibromyalgia   . GERD (gastroesophageal reflux disease)   . Hyperlipidemia   . Hypertension   . Migraine   . Myocardial infarction ([place])   . Osteoarthritis   . Peripheral arterial disease (HCC)    left leg diagnosed by Dr Mare Ferrari  . Renal artery stenosis ([place])    a. 2008 s/p PTA    Past Surgical History:  Procedure Laterality Date  . ABDOMINAL AORTAGRAM N/A 03/06/2014   Procedure: ABDOMINAL AORTAGRAM;  Surgeon: Rosetta Posner, MD;  Location: [facility] CATH LAB;  Service: Cardiovascular;  Laterality: N/A;  . ABDOMINAL AORTIC ANEURYSM REPAIR  2009   stenting  . ABDOMINAL HYSTERECTOMY     Total, 1992  . BREAST BIOPSY    . CARDIAC CATHETERIZATION    . CARDIOVASCULAR STRESS TEST  11/11/2009   normal study  . CARPAL TUNNEL RELEASE  08/1993   left wrist  . CARPAL TUNNEL RELEASE  01/1997   right  . CORONARY ARTERY BYPASS GRAFT  07/1994   Triple by Dr Ceasar Mons  . dental implants    . HAND SURGERY Right 09/2002   Right hand pulley release  . LEFT HEART CATHETERIZATION WITH CORONARY/GRAFT ANGIOGRAM N/A 06/11/2013   Procedure: LEFT HEART CATHETERIZATION WITH [name];  Surgeon: Thayer Headings, MD;  Location: [facility] CATH LAB;  Service: Cardiovascular;  Laterality: N/A;  . MULTIPLE TOOTH EXTRACTIONS  2000   All upper teeth.  Has full dneture.   [name] RENAL ARTERY STENT  2008  . thyroid cyst apiration  05/1997 and 07/1998  . TONSILECTOMY, ADENOIDECTOMY, BILATERAL MYRINGOTOMY AND TUBES  1989  . TOTAL ABDOMINAL HYSTERECTOMY  1992    Family History  Problem Relation Age of Onset  . Heart disease Mother   . Heart attack Mother   . Heart disease Father        before age 65  . Heart attack Father   . Heart disease Sister        before age 43  . Diabetes Sister   . Hyperlipidemia Sister   . Hypertension Sister   . Heart attack Sister   . Liver cancer Sister   . Breast cancer Sister   . Heart disease Brother   . Diabetes Brother   . Hyperlipidemia Brother   . Hypertension Brother   . Heart attack Brother   . AAA (abdominal aortic aneurysm) Brother   . Colon cancer Paternal Grandmother   . Heart disease Brother   . Pancreatic cancer Neg Hx   . Rectal  cancer Neg Hx   . Stomach cancer Neg Hx   . Esophageal cancer Neg Hx    Social History:  reports that she quit smoking about 22 years ago. Her smoking use included cigarettes. She smoked 0.00 packs per day for 37.00 years. She has never used smokeless tobacco. She reports that she drinks about 6.0 - 10.0 standard drinks of alcohol per week. She reports that she does not use drugs.  Allergies:  Allergies  Allergen Reactions  . Anticoagulant Compound Itching and Other (See Comments)    Hands and feet tingle and itch. Hands and feet tingle and itch.  . Morphine And Related Nausea Only and Itching  . Bextra [Valdecoxib] Other (See Comments)    Increased lft's Increased lft's  . Hydrocod Polst-Cpm Polst Er Rash  . Lipitor [Atorvastatin] Other (See Comments)    Leg weakness Leg weakness  . Mevacor [Lovastatin] Other (See Comments)     Muscle weakness Muscle weakness  . Plavix  [Clopidogrel Bisulfate] Itching and Other (See Comments)    Hands and feet tingle and itch.  . Zocor [Simvastatin] Other (See Comments)    Bones hurt Bones hurt   . Doxycycline Diarrhea    N/v/d  . Metoprolol Other (See Comments)    Hair loss  . Plavix [Clopidogrel Bisulfate] Other (See Comments)    Itching of hands and feet  . Codeine Itching and Rash    Hyper-active  . Hydrocod Polst-Cpm Polst Er Rash  . Lisinopril Cough   Medications Accu-Chek Fastclix Lancet Drum Accu-Chek SmartView Test Strips ALPRAZolam 0.25 mg tablet amLODIPine 5 mg tablet atorvastatin 80 mg tablet betamethasone, augmented 0.05 % topical cream chlorhexidine gluconate 0.12 % mouthwash estradiol 0.5 mg tablet gabapentin 300 mg capsule hydrALAZINE 10 mg tablet losartan 100 mg-hydrochlorothiazide 25 mg tablet omeprazole 40 mg capsule,delayed release potassium chloride ER 10 mEq tablet,extended release prednisoLONE acetate 1 % eye drops,suspension Restasis 0.05 % eye drops in a dropperette  Review of Systems  Constitutional: Negative.   HENT: Negative.   Eyes: Negative.   Respiratory: Negative.   Cardiovascular: Negative.   Gastrointestinal: Negative.   Genitourinary: Negative.   Musculoskeletal: Positive for joint pain.  Skin: Negative.   Neurological: Negative.   Psychiatric/Behavioral: Negative.     There were no vitals taken for this visit. Physical Exam  Constitutional: She is oriented to person, place, and time. She appears well-developed and well-nourished.  HENT:  Head: Normocephalic.  Eyes: Pupils are equal, round, and reactive to light.  Neck: Normal range of motion.  Cardiovascular: Normal rate.  Respiratory: Effort normal.  GI: Soft.  Musculoskeletal:  Patient is a 72 year old female.  Constitutional: General Appearance: healthy-appearing and NAD.  Psychiatric: Mood and Affect: normal mood and affect.  Cardiovascular System:  Arterial Pulses Right: radial normal and brachial normal. Varicosities Right: no varicosities.  C-Spine/Neck: Active Range of Motion: flexion normal, extension normal, and no pain elicited on motion.  Shoulders: Inspection Right: no misalignment, atrophy, erythema, swelling, or scapular winging. Bony Palpation Right: no tenderness of the sternoclavicular joint, the coracoid process, the acromioclavicular joint, the bicipital groove, or the scapula. Soft Tissue Palpation Right: tenderness of the supraspinatus and the subacromial bursa. Active Range of Motion Right: limited. Special Tests Right: Speed's test negative and Neer's test positive. Stability Right: no laxity, sulcus sign negative, and anterior apprehension test negative. Strength Right: abduction 5/5, adduction 5/5, flexion 5/5, and extension 5/5.  Skin: Right Upper Extremity: normal.  Neurological System: Biceps Reflex Right: normal (2). Brachioradialis Reflex  Right: normal (2). Triceps Reflex Right: normal (2). Sensation on the Right: C5 normal, C6 normal, and C7 normal.  Normal cervical lordosis. No pain with range of motion. No palpable tenderness. Motor is 5/5 in all groups in the upper extremities. Upper extremity sensory exam normal. Patient is normoreflexic in the upper extremities. No Hoffmann sign.  Neurological: She is alert and oriented to person, place, and time.  Skin: Skin is warm and dry.    MRI demonstrates a full-thickness tear of the supraspinatus and infraspinatus retracted mild atrophy. Moderate AC arthrosis. Type II acromion.  Assessment/Plan  Patient demonstrates Intermedic have rotator cuff tear. Some improvement in her motion and decreased pain but persistent forward flexion weakness.  We discussed living with her symptoms versus rotator cuff repair.  Given this is most likely acute she may be left with a significant deficit.  We therefore discussed with a proceeding with rotator cuff repair  An extensive  discussion concerning the pathology relevant anatomy and treatment options. After that discussion we mutually agreed to proceed with repair of the rotator cuff utilizing arthroscopic assistance if possible. The risks and benefits of that procedure were discussed including bleeding, infection, suboptimal range of motion, deep venous thrombosis, pulmonary embolism, anesthetic complications etc. in addition we discussed the postoperative course to include approximately 4 weeks of passive range of motion followed by 4 weeks of active range of motion followed by 4-12 weeks of progressive strengthening exercises. In addition we discussed protective activities to reduce the risk of a reinjury including impingement activities with elbow above the shoulder as well as reaching and repetitive circular motion activities. The hospital stay will either be as a outpatient with a regional block versus overnight depending upon the extent of the procedure and any challenging health issues with a first postoperative visit 2 weeks following the surgery.  Plan right shoulder mini-open RCR  Cecilie Kicks., PA-C for Dr. Tonita Cong 09/19/2018, 1:26 PM

## 2018-09-19 NOTE — H&P (View-Only) (Signed)
Megan [name] is an 72 y.o. female.   Chief Complaint: R shoulder pain HPI: Visit For: Follow up (to discuss right shoulder MRI) Hand Dominance: right Duration: 8 weeks Severity: pain level 4/10 Alleviating Factors: ice; NSAIDs (Aleve)  Past Medical History:  Diagnosis Date  . AAA (abdominal aortic aneurysm) ([place])    a. s/p stent grafting in 2009.  . Adenomatous colon polyp 05/2001  . Adenomatous duodenal polyp   . Allergy   . Anginal pain ([place])   . COPD (chronic obstructive pulmonary disease) ([place])   . Coronary artery disease    a. s/p CABG in 1995;  b. 05/2013 Neg MV, EF 82%;  c. 06/2013 Cath: LM nl, LAD 40-50p, LCX nl, RCA 80p/129m, VG->RCA->PDA->PLSA min irregs, LIMA->LAD atretic, vigorous LV fxn.  . Diabetes mellitus without complication (HCC)    history of, resolved. 2017  . Diverticulosis   . Eczema, dyshidrotic   . Eczema, dyshidrotic 1986   Dr. Mathis Fare  . Fibromyalgia   . GERD (gastroesophageal reflux disease)   . Hyperlipidemia   . Hypertension   . Migraine   . Myocardial infarction ([place])   . Osteoarthritis   . Peripheral arterial disease (HCC)    left leg diagnosed by Dr Mare Ferrari  . Renal artery stenosis ([place])    a. 2008 s/p PTA    Past Surgical History:  Procedure Laterality Date  . ABDOMINAL AORTAGRAM N/A 03/06/2014   Procedure: ABDOMINAL AORTAGRAM;  Surgeon: Rosetta Posner, MD;  Location: [facility] CATH LAB;  Service: Cardiovascular;  Laterality: N/A;  . ABDOMINAL AORTIC ANEURYSM REPAIR  2009   stenting  . ABDOMINAL HYSTERECTOMY     Total, 1992  . BREAST BIOPSY    . CARDIAC CATHETERIZATION    . CARDIOVASCULAR STRESS TEST  11/11/2009   normal study  . CARPAL TUNNEL RELEASE  08/1993   left wrist  . CARPAL TUNNEL RELEASE  01/1997   right  . CORONARY ARTERY BYPASS GRAFT  07/1994   Triple by Dr Ceasar Mons  . dental implants    . HAND SURGERY Right 09/2002   Right hand pulley release  . LEFT HEART CATHETERIZATION WITH CORONARY/GRAFT ANGIOGRAM N/A 06/11/2013   Procedure: LEFT HEART CATHETERIZATION WITH [name];  Surgeon: Thayer Headings, MD;  Location: [facility] CATH LAB;  Service: Cardiovascular;  Laterality: N/A;  . MULTIPLE TOOTH EXTRACTIONS  2000   All upper teeth.  Has full dneture.   [name] RENAL ARTERY STENT  2008  . thyroid cyst apiration  05/1997 and 07/1998  . TONSILECTOMY, ADENOIDECTOMY, BILATERAL MYRINGOTOMY AND TUBES  1989  . TOTAL ABDOMINAL HYSTERECTOMY  1992    Family History  Problem Relation Age of Onset  . Heart disease Mother   . Heart attack Mother   . Heart disease Father        before age 21  . Heart attack Father   . Heart disease Sister        before age 31  . Diabetes Sister   . Hyperlipidemia Sister   . Hypertension Sister   . Heart attack Sister   . Liver cancer Sister   . Breast cancer Sister   . Heart disease Brother   . Diabetes Brother   . Hyperlipidemia Brother   . Hypertension Brother   . Heart attack Brother   . AAA (abdominal aortic aneurysm) Brother   . Colon cancer Paternal Grandmother   . Heart disease Brother   . Pancreatic cancer Neg Hx   . Rectal  cancer Neg Hx   . Stomach cancer Neg Hx   . Esophageal cancer Neg Hx    Social History:  reports that she quit smoking about 22 years ago. Her smoking use included cigarettes. She smoked 0.00 packs per day for 37.00 years. She has never used smokeless tobacco. She reports that she drinks about 6.0 - 10.0 standard drinks of alcohol per week. She reports that she does not use drugs.  Allergies:  Allergies  Allergen Reactions  . Anticoagulant Compound Itching and Other (See Comments)    Hands and feet tingle and itch. Hands and feet tingle and itch.  . Morphine And Related Nausea Only and Itching  . Bextra [Valdecoxib] Other (See Comments)    Increased lft's Increased lft's  . Hydrocod Polst-Cpm Polst Er Rash  . Lipitor [Atorvastatin] Other (See Comments)    Leg weakness Leg weakness  . Mevacor [Lovastatin] Other (See Comments)     Muscle weakness Muscle weakness  . Plavix  [Clopidogrel Bisulfate] Itching and Other (See Comments)    Hands and feet tingle and itch.  . Zocor [Simvastatin] Other (See Comments)    Bones hurt Bones hurt   . Doxycycline Diarrhea    N/v/d  . Metoprolol Other (See Comments)    Hair loss  . Plavix [Clopidogrel Bisulfate] Other (See Comments)    Itching of hands and feet  . Codeine Itching and Rash    Hyper-active  . Hydrocod Polst-Cpm Polst Er Rash  . Lisinopril Cough   Medications Accu-Chek Fastclix Lancet Drum Accu-Chek SmartView Test Strips ALPRAZolam 0.25 mg tablet amLODIPine 5 mg tablet atorvastatin 80 mg tablet betamethasone, augmented 0.05 % topical cream chlorhexidine gluconate 0.12 % mouthwash estradiol 0.5 mg tablet gabapentin 300 mg capsule hydrALAZINE 10 mg tablet losartan 100 mg-hydrochlorothiazide 25 mg tablet omeprazole 40 mg capsule,delayed release potassium chloride ER 10 mEq tablet,extended release prednisoLONE acetate 1 % eye drops,suspension Restasis 0.05 % eye drops in a dropperette  Review of Systems  Constitutional: Negative.   HENT: Negative.   Eyes: Negative.   Respiratory: Negative.   Cardiovascular: Negative.   Gastrointestinal: Negative.   Genitourinary: Negative.   Musculoskeletal: Positive for joint pain.  Skin: Negative.   Neurological: Negative.   Psychiatric/Behavioral: Negative.     There were no vitals taken for this visit. Physical Exam  Constitutional: She is oriented to person, place, and time. She appears well-developed and well-nourished.  HENT:  Head: Normocephalic.  Eyes: Pupils are equal, round, and reactive to light.  Neck: Normal range of motion.  Cardiovascular: Normal rate.  Respiratory: Effort normal.  GI: Soft.  Musculoskeletal:  Patient is a 72 year old female.  Constitutional: General Appearance: healthy-appearing and NAD.  Psychiatric: Mood and Affect: normal mood and affect.  Cardiovascular System:  Arterial Pulses Right: radial normal and brachial normal. Varicosities Right: no varicosities.  C-Spine/Neck: Active Range of Motion: flexion normal, extension normal, and no pain elicited on motion.  Shoulders: Inspection Right: no misalignment, atrophy, erythema, swelling, or scapular winging. Bony Palpation Right: no tenderness of the sternoclavicular joint, the coracoid process, the acromioclavicular joint, the bicipital groove, or the scapula. Soft Tissue Palpation Right: tenderness of the supraspinatus and the subacromial bursa. Active Range of Motion Right: limited. Special Tests Right: Speed's test negative and Neer's test positive. Stability Right: no laxity, sulcus sign negative, and anterior apprehension test negative. Strength Right: abduction 5/5, adduction 5/5, flexion 5/5, and extension 5/5.  Skin: Right Upper Extremity: normal.  Neurological System: Biceps Reflex Right: normal (2). Brachioradialis Reflex  Right: normal (2). Triceps Reflex Right: normal (2). Sensation on the Right: C5 normal, C6 normal, and C7 normal.  Normal cervical lordosis. No pain with range of motion. No palpable tenderness. Motor is 5/5 in all groups in the upper extremities. Upper extremity sensory exam normal. Patient is normoreflexic in the upper extremities. No Hoffmann sign.  Neurological: She is alert and oriented to person, place, and time.  Skin: Skin is warm and dry.    MRI demonstrates a full-thickness tear of the supraspinatus and infraspinatus retracted mild atrophy. Moderate AC arthrosis. Type II acromion.  Assessment/Plan  Patient demonstrates Intermedic have rotator cuff tear. Some improvement in her motion and decreased pain but persistent forward flexion weakness.  We discussed living with her symptoms versus rotator cuff repair.  Given this is most likely acute she may be left with a significant deficit.  We therefore discussed with a proceeding with rotator cuff repair  An extensive  discussion concerning the pathology relevant anatomy and treatment options. After that discussion we mutually agreed to proceed with repair of the rotator cuff utilizing arthroscopic assistance if possible. The risks and benefits of that procedure were discussed including bleeding, infection, suboptimal range of motion, deep venous thrombosis, pulmonary embolism, anesthetic complications etc. in addition we discussed the postoperative course to include approximately 4 weeks of passive range of motion followed by 4 weeks of active range of motion followed by 4-12 weeks of progressive strengthening exercises. In addition we discussed protective activities to reduce the risk of a reinjury including impingement activities with elbow above the shoulder as well as reaching and repetitive circular motion activities. The hospital stay will either be as a outpatient with a regional block versus overnight depending upon the extent of the procedure and any challenging health issues with a first postoperative visit 2 weeks following the surgery.  Plan right shoulder mini-open RCR  Cecilie Kicks., PA-C for Dr. Tonita Cong 09/19/2018, 1:26 PM

## 2018-09-21 ENCOUNTER — Encounter (HOSPITAL_COMMUNITY)
Admission: RE | Admit: 2018-09-21 | Discharge: 2018-09-21 | Disposition: A | Discharge status: Home / Self Care | Payer: Medicare Other | Source: Ambulatory Visit | Attending: Specialist | Admitting: Specialist

## 2018-09-21 ENCOUNTER — Encounter (HOSPITAL_COMMUNITY): Payer: Self-pay

## 2018-09-21 ENCOUNTER — Other Ambulatory Visit: Payer: Self-pay

## 2018-09-21 DIAGNOSIS — Z7982 Long term (current) use of aspirin: Secondary | ICD-10-CM | POA: Diagnosis not present

## 2018-09-21 DIAGNOSIS — E1151 Type 2 diabetes mellitus with diabetic peripheral angiopathy without gangrene: Secondary | ICD-10-CM | POA: Diagnosis not present

## 2018-09-21 DIAGNOSIS — Z79899 Other long term (current) drug therapy: Secondary | ICD-10-CM | POA: Insufficient documentation

## 2018-09-21 DIAGNOSIS — Z01818 Encounter for other preprocedural examination: Secondary | ICD-10-CM | POA: Insufficient documentation

## 2018-09-21 DIAGNOSIS — M75101 Unspecified rotator cuff tear or rupture of right shoulder, not specified as traumatic: Secondary | ICD-10-CM | POA: Diagnosis not present

## 2018-09-21 LAB — CBC
HCT: 46.5 % — ABNORMAL HIGH (ref 36.0–46.0)
Hemoglobin: 15.4 g/dL — ABNORMAL HIGH (ref 12.0–15.0)
MCH: 29.9 pg (ref 26.0–34.0)
MCHC: 33.1 g/dL (ref 30.0–36.0)
MCV: 90.3 fL (ref 80.0–100.0)
Platelets: 239 10*3/uL (ref 150–400)
RBC: 5.15 MIL/uL — ABNORMAL HIGH (ref 3.87–5.11)
RDW: 12.5 % (ref 11.5–15.5)
WBC: 8.4 10*3/uL (ref 4.0–10.5)
nRBC: 0 % (ref 0.0–0.2)

## 2018-09-21 LAB — BASIC METABOLIC PANEL
Anion gap: 9 (ref 5–15)
BUN: 8 mg/dL (ref 8–23)
CO2: 27 mmol/L (ref 22–32)
Calcium: 9.7 mg/dL (ref 8.9–10.3)
Chloride: 104 mmol/L (ref 98–111)
Creatinine, Ser: 0.67 mg/dL (ref 0.44–1.00)
GFR calc Af Amer: >60 mL/min (ref >60)
GFR calc non Af Amer: >60 mL/min (ref >60)
Glucose, Bld: 104 mg/dL — ABNORMAL HIGH (ref 70–99)
Potassium: 4 mmol/L (ref 3.5–5.1)
Sodium: 140 mmol/L (ref 135–145)

## 2018-09-21 LAB — HEMOGLOBIN A1C
Hgb A1c MFr Bld: 5.8 % — ABNORMAL HIGH (ref 4.8–5.6)
Mean Plasma Glucose: 119.76 mg/dL

## 2018-09-21 LAB — GLUCOSE, CAPILLARY: Glucose-Capillary: 105 mg/dL — ABNORMAL HIGH (ref 70–99)

## 2018-09-27 ENCOUNTER — Encounter (HOSPITAL_COMMUNITY): Payer: Self-pay | Admitting: *Deleted

## 2018-09-27 ENCOUNTER — Ambulatory Visit (HOSPITAL_COMMUNITY)
Admission: RE | Admit: 2018-09-27 | Discharge: 2018-09-28 | Disposition: A | Discharge status: Home / Self Care | Payer: Medicare Other | Source: Ambulatory Visit | Attending: Specialist | Admitting: Specialist

## 2018-09-27 ENCOUNTER — Other Ambulatory Visit: Payer: Self-pay

## 2018-09-27 ENCOUNTER — Encounter (HOSPITAL_COMMUNITY)
Admission: RE | Disposition: A | Discharge status: Home / Self Care | Payer: Self-pay | Source: Ambulatory Visit | Attending: Specialist

## 2018-09-27 ENCOUNTER — Ambulatory Visit (HOSPITAL_COMMUNITY): Payer: Medicare Other | Admitting: Certified Registered Nurse Anesthetist

## 2018-09-27 DIAGNOSIS — J449 Chronic obstructive pulmonary disease, unspecified: Secondary | ICD-10-CM | POA: Diagnosis not present

## 2018-09-27 DIAGNOSIS — Z885 Allergy status to narcotic agent status: Secondary | ICD-10-CM | POA: Insufficient documentation

## 2018-09-27 DIAGNOSIS — I10 Essential (primary) hypertension: Secondary | ICD-10-CM | POA: Insufficient documentation

## 2018-09-27 DIAGNOSIS — M1990 Unspecified osteoarthritis, unspecified site: Secondary | ICD-10-CM | POA: Insufficient documentation

## 2018-09-27 DIAGNOSIS — E785 Hyperlipidemia, unspecified: Secondary | ICD-10-CM | POA: Diagnosis not present

## 2018-09-27 DIAGNOSIS — Z800 Family history of malignant neoplasm of digestive organs: Secondary | ICD-10-CM | POA: Diagnosis not present

## 2018-09-27 DIAGNOSIS — Z86010 Personal history of colonic polyps: Secondary | ICD-10-CM | POA: Insufficient documentation

## 2018-09-27 DIAGNOSIS — Z951 Presence of aortocoronary bypass graft: Secondary | ICD-10-CM | POA: Insufficient documentation

## 2018-09-27 DIAGNOSIS — M797 Fibromyalgia: Secondary | ICD-10-CM | POA: Insufficient documentation

## 2018-09-27 DIAGNOSIS — L301 Dyshidrosis [pompholyx]: Secondary | ICD-10-CM | POA: Diagnosis not present

## 2018-09-27 DIAGNOSIS — G43909 Migraine, unspecified, not intractable, without status migrainosus: Secondary | ICD-10-CM | POA: Insufficient documentation

## 2018-09-27 DIAGNOSIS — Z8249 Family history of ischemic heart disease and other diseases of the circulatory system: Secondary | ICD-10-CM | POA: Insufficient documentation

## 2018-09-27 DIAGNOSIS — Z79899 Other long term (current) drug therapy: Secondary | ICD-10-CM | POA: Insufficient documentation

## 2018-09-27 DIAGNOSIS — K219 Gastro-esophageal reflux disease without esophagitis: Secondary | ICD-10-CM | POA: Diagnosis not present

## 2018-09-27 DIAGNOSIS — Z95820 Peripheral vascular angioplasty status with implants and grafts: Secondary | ICD-10-CM | POA: Diagnosis not present

## 2018-09-27 DIAGNOSIS — Z881 Allergy status to other antibiotic agents status: Secondary | ICD-10-CM | POA: Insufficient documentation

## 2018-09-27 DIAGNOSIS — I252 Old myocardial infarction: Secondary | ICD-10-CM | POA: Insufficient documentation

## 2018-09-27 DIAGNOSIS — Z87891 Personal history of nicotine dependence: Secondary | ICD-10-CM | POA: Insufficient documentation

## 2018-09-27 DIAGNOSIS — Z803 Family history of malignant neoplasm of breast: Secondary | ICD-10-CM | POA: Diagnosis not present

## 2018-09-27 DIAGNOSIS — M75121 Complete rotator cuff tear or rupture of right shoulder, not specified as traumatic: Secondary | ICD-10-CM | POA: Diagnosis not present

## 2018-09-27 DIAGNOSIS — Z833 Family history of diabetes mellitus: Secondary | ICD-10-CM | POA: Insufficient documentation

## 2018-09-27 DIAGNOSIS — I739 Peripheral vascular disease, unspecified: Secondary | ICD-10-CM | POA: Insufficient documentation

## 2018-09-27 DIAGNOSIS — Z888 Allergy status to other drugs, medicaments and biological substances status: Secondary | ICD-10-CM | POA: Insufficient documentation

## 2018-09-27 DIAGNOSIS — I714 Abdominal aortic aneurysm, without rupture: Secondary | ICD-10-CM | POA: Insufficient documentation

## 2018-09-27 DIAGNOSIS — E119 Type 2 diabetes mellitus without complications: Secondary | ICD-10-CM | POA: Diagnosis not present

## 2018-09-27 DIAGNOSIS — Z90710 Acquired absence of both cervix and uterus: Secondary | ICD-10-CM | POA: Diagnosis not present

## 2018-09-27 DIAGNOSIS — M75101 Unspecified rotator cuff tear or rupture of right shoulder, not specified as traumatic: Secondary | ICD-10-CM | POA: Diagnosis not present

## 2018-09-27 DIAGNOSIS — M75122 Complete rotator cuff tear or rupture of left shoulder, not specified as traumatic: Secondary | ICD-10-CM | POA: Diagnosis not present

## 2018-09-27 DIAGNOSIS — Z7982 Long term (current) use of aspirin: Secondary | ICD-10-CM | POA: Insufficient documentation

## 2018-09-27 DIAGNOSIS — I2510 Atherosclerotic heart disease of native coronary artery without angina pectoris: Secondary | ICD-10-CM | POA: Insufficient documentation

## 2018-09-27 DIAGNOSIS — G8918 Other acute postprocedural pain: Secondary | ICD-10-CM | POA: Diagnosis not present

## 2018-09-27 DIAGNOSIS — K5790 Diverticulosis of intestine, part unspecified, without perforation or abscess without bleeding: Secondary | ICD-10-CM | POA: Diagnosis not present

## 2018-09-27 DIAGNOSIS — M75120 Complete rotator cuff tear or rupture of unspecified shoulder, not specified as traumatic: Secondary | ICD-10-CM | POA: Diagnosis present

## 2018-09-27 HISTORY — PX: SHOULDER ARTHROSCOPY WITH ROTATOR CUFF REPAIR: SHX5685

## 2018-09-27 LAB — GLUCOSE, CAPILLARY: Glucose-Capillary: 117 mg/dL — ABNORMAL HIGH (ref 70–99)

## 2018-09-27 SURGERY — ARTHROSCOPY, SHOULDER, WITH ROTATOR CUFF REPAIR
Anesthesia: General | Site: Shoulder | Laterality: Right

## 2018-09-27 MED ORDER — FENTANYL CITRATE (PF) 100 MCG/2ML IJ SOLN
INTRAMUSCULAR | Status: DC | PRN
  Administered 2018-09-27: 50 ug via INTRAVENOUS

## 2018-09-27 MED ORDER — DEXAMETHASONE SODIUM PHOSPHATE 10 MG/ML IJ SOLN
INTRAMUSCULAR | Status: DC | PRN
  Administered 2018-09-27: 10 mg via INTRAVENOUS

## 2018-09-27 MED ORDER — SODIUM CHLORIDE 0.9 % IV SOLN
INTRAVENOUS | Status: DC | PRN
  Administered 2018-09-27: 500 mL

## 2018-09-27 MED ORDER — AMLODIPINE BESYLATE 5 MG PO TABS
5.0000 mg | ORAL_TABLET | Freq: Every day | ORAL | Status: DC
  Administered 2018-09-28: 5 mg via ORAL
  Filled 2018-09-27: qty 1

## 2018-09-27 MED ORDER — CICLOPIROX OLAMINE 0.77 % EX CREA: 1.0000 "application " | TOPICAL_CREAM | Freq: Every day | CUTANEOUS | Status: DC | PRN

## 2018-09-27 MED ORDER — PHENYLEPHRINE HCL 10 MG/ML IJ SOLN
INTRAVENOUS | Status: DC | PRN
  Administered 2018-09-27: 25 ug/min via INTRAVENOUS

## 2018-09-27 MED ORDER — ALPRAZOLAM 0.25 MG PO TABS: 0.2500 mg | ORAL_TABLET | Freq: Every evening | ORAL | Status: DC | PRN

## 2018-09-27 MED ORDER — ONDANSETRON HCL 4 MG/2ML IJ SOLN: 4.0000 mg | Freq: Four times a day (QID) | INTRAMUSCULAR | Status: DC | PRN

## 2018-09-27 MED ORDER — DOCUSATE SODIUM 100 MG PO CAPS
100.0000 mg | ORAL_CAPSULE | Freq: Two times a day (BID) | ORAL | Status: DC
  Administered 2018-09-27 – 2018-09-28 (×3): 100 mg via ORAL
  Filled 2018-09-27 (×3): qty 1

## 2018-09-27 MED ORDER — TRIAMCINOLONE ACETONIDE 0.5 % EX CREA
TOPICAL_CREAM | Freq: Two times a day (BID) | CUTANEOUS | Status: DC
  Filled 2018-09-27: qty 15

## 2018-09-27 MED ORDER — POLYETHYLENE GLYCOL 3350 17 G PO PACK: 17.0000 g | PACK | Freq: Every day | ORAL | Status: DC | PRN

## 2018-09-27 MED ORDER — PROPOFOL 10 MG/ML IV BOLUS
INTRAVENOUS | Status: DC | PRN
  Administered 2018-09-27: 100 mg via INTRAVENOUS

## 2018-09-27 MED ORDER — LIDOCAINE 2% (20 MG/ML) 5 ML SYRINGE
INTRAMUSCULAR | Status: AC
  Filled 2018-09-27: qty 5

## 2018-09-27 MED ORDER — BISACODYL 5 MG PO TBEC: 5.0000 mg | DELAYED_RELEASE_TABLET | Freq: Every day | ORAL | Status: DC | PRN

## 2018-09-27 MED ORDER — SUGAMMADEX SODIUM 200 MG/2ML IV SOLN
INTRAVENOUS | Status: DC | PRN
  Administered 2018-09-27: 300 mg via INTRAVENOUS

## 2018-09-27 MED ORDER — IPRATROPIUM-ALBUTEROL 0.5-2.5 (3) MG/3ML IN SOLN
3.0000 mL | Freq: Two times a day (BID) | RESPIRATORY_TRACT | Status: DC
  Administered 2018-09-28: 3 mL via RESPIRATORY_TRACT
  Filled 2018-09-27: qty 3

## 2018-09-27 MED ORDER — ALBUTEROL SULFATE HFA 108 (90 BASE) MCG/ACT IN AERS
INHALATION_SPRAY | RESPIRATORY_TRACT | Status: DC | PRN
  Administered 2018-09-27: 5 via RESPIRATORY_TRACT

## 2018-09-27 MED ORDER — FENTANYL CITRATE (PF) 250 MCG/5ML IJ SOLN
INTRAMUSCULAR | Status: AC
  Filled 2018-09-27: qty 5

## 2018-09-27 MED ORDER — PROMETHAZINE HCL 25 MG/ML IJ SOLN: 6.2500 mg | INTRAMUSCULAR | Status: DC | PRN

## 2018-09-27 MED ORDER — MENTHOL 3 MG MT LOZG: 1.0000 | LOZENGE | OROMUCOSAL | Status: DC | PRN

## 2018-09-27 MED ORDER — METHOCARBAMOL 500 MG PO TABS
500.0000 mg | ORAL_TABLET | Freq: Four times a day (QID) | ORAL | Status: DC | PRN
  Administered 2018-09-27 – 2018-09-28 (×2): 500 mg via ORAL
  Filled 2018-09-27 (×2): qty 1

## 2018-09-27 MED ORDER — CLINDAMYCIN PHOSPHATE 1 % EX LOTN: 1.0000 "application " | TOPICAL_LOTION | Freq: Every day | CUTANEOUS | Status: DC | PRN

## 2018-09-27 MED ORDER — ALBUTEROL SULFATE HFA 108 (90 BASE) MCG/ACT IN AERS
INHALATION_SPRAY | RESPIRATORY_TRACT | Status: AC
  Filled 2018-09-27: qty 6.7

## 2018-09-27 MED ORDER — ONDANSETRON HCL 4 MG/2ML IJ SOLN
INTRAMUSCULAR | Status: AC
  Filled 2018-09-27: qty 2

## 2018-09-27 MED ORDER — BUPIVACAINE-EPINEPHRINE 0.5% -1:200000 IJ SOLN
INTRAMUSCULAR | Status: DC | PRN
  Administered 2018-09-27: 4 mL

## 2018-09-27 MED ORDER — GABAPENTIN 300 MG PO CAPS
300.0000 mg | ORAL_CAPSULE | Freq: Three times a day (TID) | ORAL | Status: DC
  Administered 2018-09-27 – 2018-09-28 (×3): 300 mg via ORAL
  Filled 2018-09-27 (×3): qty 1

## 2018-09-27 MED ORDER — SUCCINYLCHOLINE CHLORIDE 200 MG/10ML IV SOSY
PREFILLED_SYRINGE | INTRAVENOUS | Status: AC
  Filled 2018-09-27: qty 10

## 2018-09-27 MED ORDER — TRETINOIN 0.1 % EX CREA: 1.0000 "application " | TOPICAL_CREAM | CUTANEOUS | Status: DC

## 2018-09-27 MED ORDER — ROCURONIUM BROMIDE 50 MG/5ML IV SOSY
PREFILLED_SYRINGE | INTRAVENOUS | Status: DC | PRN
  Administered 2018-09-27: 60 mg via INTRAVENOUS

## 2018-09-27 MED ORDER — BUPIVACAINE LIPOSOME 1.3 % IJ SUSP
INTRAMUSCULAR | Status: DC | PRN
  Administered 2018-09-27: 10 mL via PERINEURAL

## 2018-09-27 MED ORDER — PROPOFOL 10 MG/ML IV BOLUS
INTRAVENOUS | Status: AC
  Filled 2018-09-27: qty 20

## 2018-09-27 MED ORDER — LIDOCAINE 2% (20 MG/ML) 5 ML SYRINGE
INTRAMUSCULAR | Status: DC | PRN
  Administered 2018-09-27: 100 mg via INTRAVENOUS

## 2018-09-27 MED ORDER — OXYCODONE HCL 5 MG PO TABS
5.0000 mg | ORAL_TABLET | ORAL | Status: DC | PRN
  Administered 2018-09-27 – 2018-09-28 (×2): 5 mg via ORAL
  Administered 2018-09-28: 10 mg via ORAL
  Filled 2018-09-27 (×2): qty 1
  Filled 2018-09-27: qty 2

## 2018-09-27 MED ORDER — DEXAMETHASONE SODIUM PHOSPHATE 10 MG/ML IJ SOLN
INTRAMUSCULAR | Status: AC
  Filled 2018-09-27: qty 1

## 2018-09-27 MED ORDER — METHOCARBAMOL 500 MG IVPB - SIMPLE MED
INTRAVENOUS | Status: AC
  Administered 2018-09-27: 500 mg via INTRAVENOUS
  Filled 2018-09-27: qty 50

## 2018-09-27 MED ORDER — LOSARTAN POTASSIUM 50 MG PO TABS
100.0000 mg | ORAL_TABLET | Freq: Every day | ORAL | Status: DC
  Administered 2018-09-27 – 2018-09-28 (×2): 100 mg via ORAL
  Filled 2018-09-27 (×2): qty 2

## 2018-09-27 MED ORDER — CHLORHEXIDINE GLUCONATE 4 % EX LIQD
60.0000 mL | Freq: Once | CUTANEOUS | Status: AC
  Administered 2018-09-27: 4 via TOPICAL

## 2018-09-27 MED ORDER — BUPIVACAINE HCL (PF) 0.5 % IJ SOLN
INTRAMUSCULAR | Status: DC | PRN
  Administered 2018-09-27: 15 mL

## 2018-09-27 MED ORDER — LACTATED RINGERS IV SOLN
INTRAVENOUS | Status: DC
  Administered 2018-09-27: 07:00:00 via INTRAVENOUS

## 2018-09-27 MED ORDER — ALBUTEROL SULFATE (2.5 MG/3ML) 0.083% IN NEBU
2.5000 mg | INHALATION_SOLUTION | RESPIRATORY_TRACT | Status: DC | PRN
  Administered 2018-09-27: 2.5 mg via RESPIRATORY_TRACT
  Filled 2018-09-27: qty 3

## 2018-09-27 MED ORDER — BUPIVACAINE-EPINEPHRINE 0.5% -1:200000 IJ SOLN
INTRAMUSCULAR | Status: AC
  Filled 2018-09-27: qty 1

## 2018-09-27 MED ORDER — ONDANSETRON HCL 4 MG PO TABS: 4.0000 mg | ORAL_TABLET | Freq: Four times a day (QID) | ORAL | Status: DC | PRN

## 2018-09-27 MED ORDER — KCL IN DEXTROSE-NACL 20-5-0.45 MEQ/L-%-% IV SOLN
INTRAVENOUS | Status: DC
  Administered 2018-09-27: 14:00:00 via INTRAVENOUS
  Filled 2018-09-27 (×2): qty 1000

## 2018-09-27 MED ORDER — PHENYLEPHRINE HCL 10 MG/ML IJ SOLN
INTRAMUSCULAR | Status: AC
  Filled 2018-09-27: qty 1

## 2018-09-27 MED ORDER — SUGAMMADEX SODIUM 500 MG/5ML IV SOLN
INTRAVENOUS | Status: AC
  Filled 2018-09-27: qty 5

## 2018-09-27 MED ORDER — SODIUM CHLORIDE 0.9 % IV SOLN
INTRAVENOUS | Status: AC
  Filled 2018-09-27: qty 500000

## 2018-09-27 MED ORDER — FENTANYL CITRATE (PF) 100 MCG/2ML IJ SOLN
INTRAMUSCULAR | Status: AC
  Administered 2018-09-27: 25 ug via INTRAVENOUS
  Filled 2018-09-27: qty 2

## 2018-09-27 MED ORDER — ONDANSETRON HCL 4 MG/2ML IJ SOLN
INTRAMUSCULAR | Status: DC | PRN
  Administered 2018-09-27: 4 mg via INTRAVENOUS

## 2018-09-27 MED ORDER — MORPHINE SULFATE (PF) 2 MG/ML IV SOLN: 1.0000 mg | INTRAVENOUS | Status: DC | PRN

## 2018-09-27 MED ORDER — FENTANYL CITRATE (PF) 100 MCG/2ML IJ SOLN
25.0000 ug | INTRAMUSCULAR | Status: DC | PRN
  Administered 2018-09-27 (×2): 25 ug via INTRAVENOUS

## 2018-09-27 MED ORDER — CEFAZOLIN SODIUM-DEXTROSE 2-4 GM/100ML-% IV SOLN
2.0000 g | Freq: Four times a day (QID) | INTRAVENOUS | Status: AC
  Administered 2018-09-27 (×2): 2 g via INTRAVENOUS
  Filled 2018-09-27 (×2): qty 100

## 2018-09-27 MED ORDER — NITROGLYCERIN 0.4 MG SL SUBL: 0.4000 mg | SUBLINGUAL_TABLET | SUBLINGUAL | Status: DC | PRN

## 2018-09-27 MED ORDER — POTASSIUM CHLORIDE CRYS ER 20 MEQ PO TBCR
20.0000 meq | EXTENDED_RELEASE_TABLET | Freq: Two times a day (BID) | ORAL | Status: DC
  Administered 2018-09-27 – 2018-09-28 (×3): 20 meq via ORAL
  Filled 2018-09-27: qty 1
  Filled 2018-09-27 (×2): qty 2
  Filled 2018-09-27 (×2): qty 1
  Filled 2018-09-27: qty 2

## 2018-09-27 MED ORDER — VITAMIN D (ERGOCALCIFEROL) 50 MCG (2000 UT) PO CAPS: 1.0000 | ORAL_CAPSULE | Freq: Every day | ORAL | Status: DC

## 2018-09-27 MED ORDER — ASPIRIN EC 81 MG PO TBEC
81.0000 mg | DELAYED_RELEASE_TABLET | Freq: Every day | ORAL | Status: DC
  Administered 2018-09-28: 81 mg via ORAL
  Filled 2018-09-27: qty 1

## 2018-09-27 MED ORDER — PHENOL 1.4 % MT LIQD: 1.0000 | OROMUCOSAL | Status: DC | PRN

## 2018-09-27 MED ORDER — HYDROCHLOROTHIAZIDE 25 MG PO TABS
25.0000 mg | ORAL_TABLET | Freq: Every day | ORAL | Status: DC
  Administered 2018-09-27 – 2018-09-28 (×2): 25 mg via ORAL
  Filled 2018-09-27 (×2): qty 1

## 2018-09-27 MED ORDER — ALBUTEROL SULFATE HFA 108 (90 BASE) MCG/ACT IN AERS: 1.0000 | INHALATION_SPRAY | RESPIRATORY_TRACT | Status: DC | PRN

## 2018-09-27 MED ORDER — METOCLOPRAMIDE HCL 5 MG PO TABS: 5.0000 mg | ORAL_TABLET | Freq: Three times a day (TID) | ORAL | Status: DC | PRN

## 2018-09-27 MED ORDER — METHOCARBAMOL 500 MG IVPB - SIMPLE MED
500.0000 mg | Freq: Four times a day (QID) | INTRAVENOUS | Status: DC | PRN
  Administered 2018-09-27: 500 mg via INTRAVENOUS
  Filled 2018-09-27: qty 50

## 2018-09-27 MED ORDER — DIPHENHYDRAMINE HCL 12.5 MG/5ML PO ELIX: 12.5000 mg | ORAL_SOLUTION | ORAL | Status: DC | PRN

## 2018-09-27 MED ORDER — ROCURONIUM BROMIDE 10 MG/ML (PF) SYRINGE
PREFILLED_SYRINGE | INTRAVENOUS | Status: AC
  Filled 2018-09-27: qty 10

## 2018-09-27 MED ORDER — HYDRALAZINE HCL 10 MG PO TABS
10.0000 mg | ORAL_TABLET | Freq: Three times a day (TID) | ORAL | Status: DC
  Administered 2018-09-27 – 2018-09-28 (×3): 10 mg via ORAL
  Filled 2018-09-27 (×4): qty 1

## 2018-09-27 MED ORDER — METOCLOPRAMIDE HCL 5 MG/ML IJ SOLN: 5.0000 mg | Freq: Three times a day (TID) | INTRAMUSCULAR | Status: DC | PRN

## 2018-09-27 MED ORDER — OXYCODONE HCL 5 MG PO TABS: 5.0000 mg | ORAL_TABLET | ORAL | 0 refills | Status: DC | PRN

## 2018-09-27 MED ORDER — PANTOPRAZOLE SODIUM 40 MG PO TBEC
40.0000 mg | DELAYED_RELEASE_TABLET | Freq: Every day | ORAL | Status: DC
  Administered 2018-09-28: 40 mg via ORAL
  Filled 2018-09-27: qty 1

## 2018-09-27 MED ORDER — CYCLOSPORINE 0.05 % OP EMUL
1.0000 [drp] | Freq: Every day | OPHTHALMIC | Status: DC
  Administered 2018-09-27: 1 [drp] via OPHTHALMIC
  Filled 2018-09-27: qty 1

## 2018-09-27 MED ORDER — LOSARTAN POTASSIUM-HCTZ 100-25 MG PO TABS: 1.0000 | ORAL_TABLET | Freq: Every day | ORAL | Status: DC

## 2018-09-27 MED ORDER — VITAMIN D 1000 UNITS PO TABS
2000.0000 [IU] | ORAL_TABLET | Freq: Every day | ORAL | Status: DC
  Administered 2018-09-27 – 2018-09-28 (×2): 2000 [IU] via ORAL
  Filled 2018-09-27 (×3): qty 2

## 2018-09-27 MED ORDER — ALUM & MAG HYDROXIDE-SIMETH 200-200-20 MG/5ML PO SUSP: 30.0000 mL | ORAL | Status: DC | PRN

## 2018-09-27 MED ORDER — MAGNESIUM CITRATE PO SOLN: 1.0000 | Freq: Once | ORAL | Status: DC | PRN

## 2018-09-27 MED ORDER — RISAQUAD PO CAPS
1.0000 | ORAL_CAPSULE | Freq: Every day | ORAL | Status: DC
  Administered 2018-09-27 – 2018-09-28 (×2): 1 via ORAL
  Filled 2018-09-27 (×2): qty 1

## 2018-09-27 MED ORDER — ESTRADIOL 1 MG PO TABS
0.5000 mg | ORAL_TABLET | Freq: Every day | ORAL | Status: DC
  Administered 2018-09-27 – 2018-09-28 (×2): 0.5 mg via ORAL
  Filled 2018-09-27 (×2): qty 0.5

## 2018-09-27 MED ORDER — CEFAZOLIN SODIUM-DEXTROSE 2-4 GM/100ML-% IV SOLN
2.0000 g | INTRAVENOUS | Status: AC
  Administered 2018-09-27: 2 g via INTRAVENOUS
  Filled 2018-09-27: qty 100

## 2018-09-27 MED ORDER — SUGAMMADEX SODIUM 200 MG/2ML IV SOLN
INTRAVENOUS | Status: AC
  Filled 2018-09-27: qty 2

## 2018-09-27 SURGICAL SUPPLY — 47 items
ANCHOR NEEDLE 9/16 CIR SZ 8 (NEEDLE) IMPLANT
ANCHOR PEEK 4.75X19.1 SWLK C (Anchor) ×8 IMPLANT
BAG DECANTER FOR FLEXI CONT (MISCELLANEOUS) ×2 IMPLANT
BAG ZIPLOCK 12X15 (MISCELLANEOUS) IMPLANT
BLADE OSCILLATING/SAGITTAL (BLADE)
BLADE SW THK.38XMED LNG THN (BLADE) IMPLANT
CLOTH 2% CHLOROHEXIDINE 3PK (PERSONAL CARE ITEMS) ×2 IMPLANT
CLSR STERI-STRIP ANTIMIC 1/2X4 (GAUZE/BANDAGES/DRESSINGS) ×2 IMPLANT
COVER SURGICAL LIGHT HANDLE (MISCELLANEOUS) ×2 IMPLANT
COVER WAND RF STERILE (DRAPES) IMPLANT
DECANTER SPIKE VIAL GLASS SM (MISCELLANEOUS) ×2 IMPLANT
DRAPE POUCH INSTRU U-SHP 10X18 (DRAPES) ×2 IMPLANT
DRSG AQUACEL AG ADV 3.5X 4 (GAUZE/BANDAGES/DRESSINGS) ×4 IMPLANT
DRSG AQUACEL AG ADV 3.5X 6 (GAUZE/BANDAGES/DRESSINGS) IMPLANT
DURAPREP 26ML APPLICATOR (WOUND CARE) ×2 IMPLANT
ELECT NEEDLE TIP 2.8 STRL (NEEDLE) ×2 IMPLANT
ELECT REM PT RETURN 15FT ADLT (MISCELLANEOUS) ×2 IMPLANT
GLOVE BIOGEL PI IND STRL 7.5 (GLOVE) ×1 IMPLANT
GLOVE BIOGEL PI IND STRL 8 (GLOVE) ×1 IMPLANT
GLOVE BIOGEL PI INDICATOR 7.5 (GLOVE) ×1
GLOVE BIOGEL PI INDICATOR 8 (GLOVE) ×1
GLOVE SURG SS PI 7.5 STRL IVOR (GLOVE) ×4 IMPLANT
GLOVE SURG SS PI 8.0 STRL IVOR (GLOVE) ×2 IMPLANT
GOWN STRL REUS W/TWL XL LVL3 (GOWN DISPOSABLE) ×4 IMPLANT
KIT BASIN OR (CUSTOM PROCEDURE TRAY) ×2 IMPLANT
KIT POSITION SHOULDER SCHLEI (MISCELLANEOUS) ×2 IMPLANT
MANIFOLD NEPTUNE II (INSTRUMENTS) ×2 IMPLANT
MARKER SKIN DUAL TIP RULER LAB (MISCELLANEOUS) ×2 IMPLANT
NEEDLE SCORPION MULTI FIRE (NEEDLE) ×2 IMPLANT
PACK SHOULDER (CUSTOM PROCEDURE TRAY) ×2 IMPLANT
POSITIONER SURGICAL ARM (MISCELLANEOUS) IMPLANT
SLING ARM IMMOBILIZER LRG (SOFTGOODS) IMPLANT
SLING ULTRA II L (ORTHOPEDIC SUPPLIES) IMPLANT
STRIP CLOSURE SKIN 1/2X4 (GAUZE/BANDAGES/DRESSINGS) ×2 IMPLANT
SUT BONE WAX W31G (SUTURE) IMPLANT
SUT ETHIBOND NAB CT1 #1 30IN (SUTURE) IMPLANT
SUT FIBERWIRE #2 38 T-5 BLUE (SUTURE)
SUT PROLENE 3 0 PS 2 (SUTURE) ×2 IMPLANT
SUT TIGER TAPE 7 IN WHITE (SUTURE) ×2 IMPLANT
SUT VIC AB 1-0 CT2 27 (SUTURE) ×2 IMPLANT
SUT VIC AB 2-0 CT2 27 (SUTURE) ×2 IMPLANT
SUT VICRYL 0 TIES 12 18 (SUTURE) ×2 IMPLANT
SUTURE FIBERWR #2 38 T-5 BLUE (SUTURE) IMPLANT
TAPE FIBER 2MM 7IN #2 BLUE (SUTURE) ×2 IMPLANT
TOWEL OR 17X26 10 PK STRL BLUE (TOWEL DISPOSABLE) ×2 IMPLANT
TOWEL OR NON WOVEN STRL DISP B (DISPOSABLE) ×2 IMPLANT
YANKAUER SUCT BULB TIP NO VENT (SUCTIONS) ×2 IMPLANT

## 2018-09-27 NOTE — Op Note (Signed)
NAME: Megan [name], Megan [name] MEDICAL RECORD AS:[id number] ACCOUNT [id number] DATE OF BIRTH:[birth year] FACILITY: [facility] LOCATION: [facility] PHYSICIAN:Katurah Karapetian Windy Kalata, MD  OPERATIVE REPORT  DATE OF PROCEDURE:  09/27/2018  PREOPERATIVE DIAGNOSIS:  Full-thickness retracted massive rotator cuff tear on the right.  POSTOPERATIVE DIAGNOSIS:  Full thickness retracted massive rotator cuff tear on the right.  PROCEDURE PERFORMED: 1.  Mini open rotator cuff repair on the right utilizing SwiveLock suture anchors. 2.  Subacromial decompression. 3.  Bursectomy.  ANESTHESIA:  General.  ASSISTANT:  Lacie Draft, PA  HISTORY:  A 72 year old with retracted tear of the rotator cuff confirmed by MRI indicated for repair with a significant negative affect to her activities of daily living.  Risks and benefits discussed including bleeding, infection, damage to  neurovascular structures, no change in symptoms, worsening symptoms, DVT, PE, anesthetic complications, etc.  DESCRIPTION OF PROCEDURE:  The patient in supine beach chair position.  After induction of adequate regional block and general anesthesia, the right shoulder and upper extremity was prepped and draped in the usual sterile fashion.  Good range of motion  was noted preoperatively.  A surgical marker was utilized to delineate the acromion, AC joint and coracoid.  A standard 2 cm incision over the anterolateral aspect of the acromion was then made through skin and subcutaneous tissue was dissected with  cautery to achieve hemostasis.  The rhaphe between anterolateral heads was divided in line with the skin incision.  A self-retaining retractor was then placed.  I released the CA ligament.  A 3 mm Kerrison was utilized to form a limited acromioplasty of  the inferior aspect.  A self-retaining retractor was placed.  Irrigated the wound.  A retracted tear was noted above the supraspinatus and infraspinatus as well as a portion of the subscap.  I made a  trough lateral to the articular surface and removing a  2 mm portion of the articular surface as advancement  after mobilization on the bursal.  Articular surface was maximized 1.5 cm.  An awl was utilized to fashion a pilot hole just lateral to the bicipital groove and 1.5 cm posterior to that.  Initial  pilot hole posteriorly was fairly osteoporotic bone.  We then repositioned that lateral for better bone.  She does have underlying osteopenia.Two  SwiveLock suture anchors were then placed in the bone with a TigerTape suture.  Both were passed through  the supraspinatus and the infraspinatus catching a portion of the subscap.  With a Scorpion suture passer, they were crossed over the lateral aspect of the greater tuberosity, placed in the bone with a second row of SwiveLocks after fashioning a pilot  hole with an awl.  Then, insertion without undue tension.  Slight abduction of the arm.  Full coverage was noted.  Redundant suture removed.  Copiously irrigated the wound.  Inspection revealed the integrity of the cuff.  Full coverage was noted and  intact.  I copiously irrigated the wound.  No active bleeding.  I closed the raphae with 0 Vicryl, subcutaneous with 2-0 and skin with Prolene.  Sterile dressing applied.  Placed an abduction pillow.    Extubated without difficulty and transported to the recovery room in satisfactory condition.  The patient tolerated the procedure well.  No complications.    Assistant was [employer], [place].  Minimal blood loss.  AN/NUANCE  D:09/27/2018 T:09/27/2018 JOB:[id number]

## 2018-09-27 NOTE — Anesthesia Postprocedure Evaluation (Signed)
Anesthesia Post Note  Patient: Megan [name]  Procedure(s) Performed: Right shoulder mini open rotator cuff repair (Right Shoulder)     Patient location during evaluation: PACU Anesthesia Type: General Level of consciousness: awake and alert Pain management: pain level controlled Vital Signs Assessment: post-procedure vital signs reviewed and stable Respiratory status: spontaneous breathing, nonlabored ventilation, respiratory function stable and patient connected to nasal cannula oxygen Cardiovascular status: blood pressure returned to baseline and stable Postop Assessment: no apparent nausea or vomiting Anesthetic complications: no    Last Vitals:  Vitals:   09/27/18 0945 09/27/18 1000  BP: 116/77 (!) 136/55  Pulse: 82 78  Resp: (!) 21 16  Temp:    SpO2: 95% 95%    Last Pain:  Vitals:   09/27/18 1000  TempSrc:   PainSc: 5                  Iyauna Sing S

## 2018-09-27 NOTE — Brief Op Note (Signed)
09/27/2018  8:42 AM  PATIENT:  Guerry Minors  72 y.o. female  PRE-OPERATIVE DIAGNOSIS:  Right shoulder rotator cuff tear  POST-OPERATIVE DIAGNOSIS:  Right shoulder rotator cuff tear  PROCEDURE:  Procedure(s) with comments: Right shoulder mini open rotator cuff repair (Right) - 90 mins  SURGEON:  Surgeon(s) and Role:    Susa Day, MD - Primary  PHYSICIAN ASSISTANT:   ASSISTANTS: [place]   ANESTHESIA:   general  EBL:  25 mL   BLOOD ADMINISTERED:none  DRAINS: none   LOCAL MEDICATIONS USED:  MARCAINE     SPECIMEN:  No Specimen  DISPOSITION OF SPECIMEN:  N/A  COUNTS:  YES  TOURNIQUET:  * No tourniquets in log *  DICTATION: .Other Dictation: Dictation Number [id number]  PLAN OF CARE: Admit for overnight observation  PATIENT DISPOSITION:  PACU - hemodynamically stable.   Delay start of Pharmacological VTE agent (>24hrs) due to surgical blood loss or risk of bleeding: no

## 2018-09-27 NOTE — Transfer of Care (Signed)
Immediate Anesthesia Transfer of Care Note  Patient: Megan [name]  Procedure(s) Performed: Right shoulder mini open rotator cuff repair (Right Shoulder)  Patient Location: PACU  Anesthesia Type:General and regional  Level of Consciousness: awake, alert  and patient cooperative  Airway & Oxygen Therapy: Patient Spontanous Breathing and Patient connected to face mask oxygen  Post-op Assessment: Report given to RN and Post -op Vital signs reviewed and stable  Post vital signs: Reviewed and stable  Last Vitals:  Vitals Value Taken Time  BP 149/93 09/27/2018  9:05 AM  Temp    Pulse 83 09/27/2018  9:12 AM  Resp 20 09/27/2018  9:12 AM  SpO2 100 % 09/27/2018  9:12 AM  Vitals shown include unvalidated device data.  Last Pain:  Vitals:   09/27/18 0637  TempSrc:   PainSc: 0-No pain         Complications: No apparent anesthesia complications

## 2018-09-27 NOTE — Anesthesia Preprocedure Evaluation (Addendum)
Anesthesia Evaluation  Patient identified by MRN, date of birth, ID band Patient awake    Reviewed: Allergy & Precautions, NPO status , Patient's Chart, lab work & pertinent test results  Airway Mallampati: II  TM Distance: >3 FB Neck ROM: Full    Dental no notable dental hx.    Pulmonary COPD, former smoker,    breath sounds clear to auscultation + decreased breath sounds      Cardiovascular hypertension, + CAD, + Past MI, + CABG and + Peripheral Vascular Disease  Normal cardiovascular exam Rhythm:Regular Rate:Normal     Neuro/Psych negative neurological ROS  negative psych ROS   GI/Hepatic negative GI ROS, Neg liver ROS,   Endo/Other  diabetes  Renal/GU negative Renal ROS  negative genitourinary   Musculoskeletal negative musculoskeletal ROS (+)   Abdominal   Peds negative pediatric ROS (+)  Hematology negative hematology ROS (+)   Anesthesia Other Findings   Reproductive/Obstetrics negative OB ROS                            Anesthesia Physical Anesthesia Plan  ASA: III  Anesthesia Plan: General   Post-op Pain Management:  Regional for Post-op pain   Induction: Intravenous  PONV Risk Score and Plan: 3 and Ondansetron, Dexamethasone and Treatment may vary due to age or medical condition  Airway Management Planned: Oral ETT  Additional Equipment:   Intra-op Plan:   Post-operative Plan: Extubation in OR  Informed Consent: I have reviewed the patients History and Physical, chart, labs and discussed the procedure including the risks, benefits and alternatives for the proposed anesthesia with the patient or authorized representative who has indicated his/her understanding and acceptance.   Dental advisory given  Plan Discussed with: CRNA and Surgeon  Anesthesia Plan Comments: (Patient dyspneic after block. I believe she has significant COPD that is untreated. )        Anesthesia Quick Evaluation

## 2018-09-27 NOTE — Anesthesia Procedure Notes (Signed)
Anesthesia Regional Block: Interscalene brachial plexus block   Pre-Anesthetic Checklist: ,, timeout performed, Correct Patient, Correct Site, Correct Laterality, Correct Procedure, Correct Position, site marked, Risks and benefits discussed,  Surgical consent,  Pre-op evaluation,  At surgeon's request and post-op pain management  Laterality: Right  Prep: chloraprep       Needles:  Injection technique: Single-shot  Needle Type: Echogenic Needle     Needle Length: 9cm      Additional Needles:   Procedures:,,,, ultrasound used (permanent image in chart),,,,  Narrative:  Start time: 09/27/2018 6:57 AM End time: 09/27/2018 7:16 AM Injection made incrementally with aspirations every 5 mL.  Performed by: Personally  Anesthesiologist: Myrtie Soman, MD  Additional Notes: Patient tolerated the procedure well without complications

## 2018-09-27 NOTE — Interval H&P Note (Signed)
History and Physical Interval Note:  09/27/2018 7:27 AM  Megan [name]  has presented today for surgery, with the diagnosis of Right shoulder rotator cuff tear  The various methods of treatment have been discussed with the patient and family. After consideration of risks, benefits and other options for treatment, the patient has consented to  Procedure(s) with comments: Right shoulder mini open rotator cuff repair (Right) - 90 mins as a surgical intervention .  The patient's history has been reviewed, patient examined, no change in status, stable for surgery.  I have reviewed the patient's chart and labs.  Questions were answered to the patient's satisfaction.     Milburn Freeney C

## 2018-09-27 NOTE — Anesthesia Procedure Notes (Signed)
Anesthesia Procedure Image    

## 2018-09-27 NOTE — Anesthesia Procedure Notes (Signed)
Procedure Name: Intubation Date/Time: 09/27/2018 7:44 AM Performed by: Maxwell Caul, CRNA Pre-anesthesia Checklist: Patient identified, Emergency Drugs available, Suction available and Patient being monitored Patient Re-evaluated:Patient Re-evaluated prior to induction Oxygen Delivery Method: Circle system utilized Preoxygenation: Pre-oxygenation with 100% oxygen Induction Type: IV induction Ventilation: Mask ventilation without difficulty Laryngoscope Size: Mac and 3 Grade View: Grade I Tube type: Oral Tube size: 7.0 mm Number of attempts: 1 Airway Equipment and Method: Stylet Placement Confirmation: ETT inserted through vocal cords under direct vision,  positive ETCO2 and breath sounds checked- equal and bilateral Secured at: 21 cm Tube secured with: Tape Dental Injury: Teeth and Oropharynx as per pre-operative assessment

## 2018-09-28 ENCOUNTER — Encounter (HOSPITAL_COMMUNITY): Payer: Self-pay | Admitting: Specialist

## 2018-09-28 DIAGNOSIS — J449 Chronic obstructive pulmonary disease, unspecified: Secondary | ICD-10-CM

## 2018-09-28 DIAGNOSIS — Z95820 Peripheral vascular angioplasty status with implants and grafts: Secondary | ICD-10-CM | POA: Diagnosis not present

## 2018-09-28 DIAGNOSIS — I10 Essential (primary) hypertension: Secondary | ICD-10-CM | POA: Diagnosis not present

## 2018-09-28 DIAGNOSIS — E119 Type 2 diabetes mellitus without complications: Secondary | ICD-10-CM | POA: Diagnosis not present

## 2018-09-28 DIAGNOSIS — K219 Gastro-esophageal reflux disease without esophagitis: Secondary | ICD-10-CM | POA: Diagnosis not present

## 2018-09-28 DIAGNOSIS — Z833 Family history of diabetes mellitus: Secondary | ICD-10-CM | POA: Diagnosis not present

## 2018-09-28 DIAGNOSIS — L301 Dyshidrosis [pompholyx]: Secondary | ICD-10-CM | POA: Diagnosis not present

## 2018-09-28 DIAGNOSIS — Z8249 Family history of ischemic heart disease and other diseases of the circulatory system: Secondary | ICD-10-CM | POA: Diagnosis not present

## 2018-09-28 DIAGNOSIS — M797 Fibromyalgia: Secondary | ICD-10-CM | POA: Diagnosis not present

## 2018-09-28 DIAGNOSIS — M1990 Unspecified osteoarthritis, unspecified site: Secondary | ICD-10-CM | POA: Diagnosis not present

## 2018-09-28 DIAGNOSIS — I252 Old myocardial infarction: Secondary | ICD-10-CM | POA: Diagnosis not present

## 2018-09-28 DIAGNOSIS — M75121 Complete rotator cuff tear or rupture of right shoulder, not specified as traumatic: Secondary | ICD-10-CM | POA: Diagnosis not present

## 2018-09-28 DIAGNOSIS — E785 Hyperlipidemia, unspecified: Secondary | ICD-10-CM | POA: Diagnosis not present

## 2018-09-28 DIAGNOSIS — Z87891 Personal history of nicotine dependence: Secondary | ICD-10-CM | POA: Diagnosis not present

## 2018-09-28 DIAGNOSIS — Z800 Family history of malignant neoplasm of digestive organs: Secondary | ICD-10-CM | POA: Diagnosis not present

## 2018-09-28 DIAGNOSIS — I2510 Atherosclerotic heart disease of native coronary artery without angina pectoris: Secondary | ICD-10-CM | POA: Diagnosis not present

## 2018-09-28 DIAGNOSIS — K5790 Diverticulosis of intestine, part unspecified, without perforation or abscess without bleeding: Secondary | ICD-10-CM | POA: Diagnosis not present

## 2018-09-28 DIAGNOSIS — G43909 Migraine, unspecified, not intractable, without status migrainosus: Secondary | ICD-10-CM | POA: Diagnosis not present

## 2018-09-28 DIAGNOSIS — I714 Abdominal aortic aneurysm, without rupture: Secondary | ICD-10-CM | POA: Diagnosis not present

## 2018-09-28 DIAGNOSIS — Z803 Family history of malignant neoplasm of breast: Secondary | ICD-10-CM | POA: Diagnosis not present

## 2018-09-28 DIAGNOSIS — Z951 Presence of aortocoronary bypass graft: Secondary | ICD-10-CM | POA: Diagnosis not present

## 2018-09-28 DIAGNOSIS — I739 Peripheral vascular disease, unspecified: Secondary | ICD-10-CM | POA: Diagnosis not present

## 2018-09-28 DIAGNOSIS — Z86010 Personal history of colonic polyps: Secondary | ICD-10-CM | POA: Diagnosis not present

## 2018-09-28 LAB — CBC
HCT: 43.5 % (ref 36.0–46.0)
Hemoglobin: 14.2 g/dL (ref 12.0–15.0)
MCH: 29.8 pg (ref 26.0–34.0)
MCHC: 32.6 g/dL (ref 30.0–36.0)
MCV: 91.4 fL (ref 80.0–100.0)
Platelets: 197 10*3/uL (ref 150–400)
RBC: 4.76 MIL/uL (ref 3.87–5.11)
RDW: 12.3 % (ref 11.5–15.5)
WBC: 15.9 10*3/uL — ABNORMAL HIGH (ref 4.0–10.5)
nRBC: 0 % (ref 0.0–0.2)

## 2018-09-28 LAB — BASIC METABOLIC PANEL
Anion gap: 11 (ref 5–15)
BUN: 12 mg/dL (ref 8–23)
CO2: 25 mmol/L (ref 22–32)
Calcium: 9.6 mg/dL (ref 8.9–10.3)
Chloride: 103 mmol/L (ref 98–111)
Creatinine, Ser: 0.74 mg/dL (ref 0.44–1.00)
GFR calc Af Amer: >60 mL/min (ref >60)
GFR calc non Af Amer: >60 mL/min (ref >60)
Glucose, Bld: 134 mg/dL — ABNORMAL HIGH (ref 70–99)
Potassium: 3.7 mmol/L (ref 3.5–5.1)
Sodium: 139 mmol/L (ref 135–145)

## 2018-09-28 MED ORDER — HYDRALAZINE HCL 10 MG PO TABS: 10.0000 mg | ORAL_TABLET | Freq: Three times a day (TID) | ORAL | 2 refills | Status: DC

## 2018-09-28 MED ORDER — IPRATROPIUM-ALBUTEROL 20-100 MCG/ACT IN AERS: 1.0000 | INHALATION_SPRAY | Freq: Four times a day (QID) | RESPIRATORY_TRACT | 1 refills | Status: DC | PRN

## 2018-09-28 NOTE — Discharge Summary (Signed)
Patient ID: Megan [name] MRN: [id number] DOB/AGE: Feb 04, 1946 72 y.o.  Admit date: 09/27/2018 Discharge date: 09/28/2018  Admission Diagnoses:  Principal Problem:   Right rotator cuff tear Active Problems:   Complete rotator cuff tear   COPD with chronic bronchitis [facility])   Discharge Diagnoses:  Same  Past Medical History:  Diagnosis Date  . AAA (abdominal aortic aneurysm) ([place])    a. s/p stent grafting in 2009.  . Adenomatous colon polyp 05/2001  . Adenomatous duodenal polyp   . Allergy   . Anginal pain ([place])   . COPD (chronic obstructive pulmonary disease) ([place])   . Coronary artery disease    a. s/p CABG in 1995;  b. 05/2013 Neg MV, EF 82%;  c. 06/2013 Cath: LM nl, LAD 40-50p, LCX nl, RCA 80p/176m, VG->RCA->PDA->PLSA min irregs, LIMA->LAD atretic, vigorous LV fxn.  . Diabetes mellitus without complication (HCC)    history of, resolved. 2017  . Diverticulosis   . Eczema, dyshidrotic   . Eczema, dyshidrotic 1986   Dr. Mathis Fare  . Fibromyalgia   . GERD (gastroesophageal reflux disease)   . Hyperlipidemia   . Hypertension   . Migraine   . Myocardial infarction ([place])   . Osteoarthritis   . Peripheral arterial disease (HCC)    left leg diagnosed by Dr Mare Ferrari  . Renal artery stenosis ([place])    a. 2008 s/p PTA    Surgeries: Procedure(s): Right shoulder mini open rotator cuff repair on 09/27/2018   Consultants:   Discharged Condition: Improved  Hospital Course: ONA ROEHRS is an 72 y.o. female who was admitted 09/27/2018 for operative treatment ofRight rotator cuff tear. Patient has severe unremitting pain that affects sleep, daily activities, and work/hobbies. After pre-op clearance the patient was taken to the operating room on 09/27/2018 and underwent  Procedure(s): Right shoulder mini open rotator cuff repair.    Patient was given perioperative antibiotics:  Anti-infectives (From admission, onward)   Start     Dose/Rate Route Frequency Ordered Stop   09/27/18 1400  ceFAZolin (ANCEF) IVPB 2g/100 mL premix     2 g 200 mL/hr over 30 Minutes Intravenous Every 6 hours 09/27/18 1041 09/27/18 2054   09/27/18 0828  polymyxin B 500,000 Units, bacitracin 50,000 Units in sodium chloride 0.9 % 500 mL irrigation  Status:  Discontinued       As needed 09/27/18 0828 09/27/18 0902   09/27/18 0600  ceFAZolin (ANCEF) IVPB 2g/100 mL premix     2 g 200 mL/hr over 30 Minutes Intravenous On call to O.R. 09/27/18 0537 09/27/18 0800       Patient was given sequential compression devices, early ambulation, and chemoprophylaxis to prevent DVT.  Patient benefited maximally from hospital stay and there were no complications.    Recent vital signs:  Patient Vitals for the past 24 hrs:  BP Temp Temp src Pulse Resp SpO2  09/28/18 1146 - - - - - 95 %  09/28/18 1032 - - - - - (!) 82 %  09/28/18 0932 (!) 144/64 98 F (36.7 C) Oral 88 18 95 %  09/28/18 0911 - - - - - 95 %  09/28/18 0558 (!) 123/51 97.8 F (36.6 C) Oral 75 19 95 %  09/28/18 0450 - - - 76 20 96 %  09/28/18 0445 - - - 80 20 91 %  09/28/18 0139 (!) 144/72 (!) 97.3 F (36.3 C) Oral 87 15 95 %  09/27/18 2316 - - - - - 97 %  09/27/18  2130 (!) 146/66 97.7 F (36.5 C) Oral 84 20 95 %  09/27/18 2025 - - - 86 (!) 24 96 %  09/27/18 1517 (!) 157/90 (!) 97.5 F (36.4 C) Oral 76 16 93 %  09/27/18 1430 (!) 158/82 97.8 F (36.6 C) Oral 82 16 100 %  09/27/18 1300 (!) 161/85 (!) 97.5 F (36.4 C) Oral 84 16 100 %     Recent laboratory studies:  Recent Labs    09/28/18 0500  WBC 15.9*  HGB 14.2  HCT 43.5  PLT 197  NA 139  K 3.7  CL 103  CO2 25  BUN 12  CREATININE 0.74  GLUCOSE 134*  CALCIUM 9.6     Discharge Medications:   Allergies as of 09/28/2018      Reactions   Anticoagulant Compound Itching, Other (See Comments)   Hands and feet tingle and itch. Hands and feet tingle and itch.   Morphine And Related Nausea Only, Itching   Bextra [valdecoxib] Other (See Comments)   Increased  lft's Increased lft's   Hydrocod Polst-cpm Polst Er Rash   Lipitor [atorvastatin] Other (See Comments)   Leg weakness Leg weakness   Mevacor [lovastatin] Other (See Comments)   Muscle weakness Muscle weakness   Plavix  [clopidogrel Bisulfate] Itching, Other (See Comments)   Hands and feet tingle and itch.   Zocor [simvastatin] Other (See Comments)   Bones hurt Bones hurt   Doxycycline Diarrhea   N/v/d   Metoprolol Other (See Comments)   Hair loss   Plavix [clopidogrel Bisulfate] Other (See Comments)   Itching of hands and feet   Codeine Itching, Rash   Hyper-active   Hydrocod Polst-cpm Polst Er Rash   Lisinopril Cough      Medication List    TAKE these medications   ALPRAZolam 0.25 MG tablet Commonly known as:  XANAX TAKE 1 TABLET BY MOUTH AT BEDTIME AS NEEDED   amLODipine 5 MG tablet Commonly known as:  NORVASC TAKE 1 TABLET BY MOUTH EVERY DAY   aspirin EC 81 MG tablet Take 1 tablet (81 mg total) by mouth daily.   atorvastatin 80 MG tablet Commonly known as:  LIPITOR Take 1 tablet (80 mg total) by mouth daily. What changed:  when to take this   augmented betamethasone dipropionate 0.05 % cream Commonly known as:  DIPROLENE-AF Apply 1 application topically daily as needed. For hands   ciclopirox 0.77 % cream Commonly known as:  LOPROX Apply 1 application topically daily as needed (hands).   clindamycin 1 % lotion Commonly known as:  CLEOCIN T Apply 1 application topically daily as needed (apply to face).   clobetasol cream 0.05 % Commonly known as:  TEMOVATE Apply 1 application topically 2 (two) times daily as needed (hands).   estradiol 0.5 MG tablet Commonly known as:  ESTRACE Take 1 tablet (0.5 mg total) by mouth daily.   gabapentin 300 MG capsule Commonly known as:  NEURONTIN Take 1 capsule (300 mg total) by mouth 3 (three) times daily.   hydrALAZINE 10 MG tablet Commonly known as:  APRESOLINE Take 1 tablet (10 mg total) by mouth 3 (three)  times daily. Hold if blood pressure remains low What changed:  additional instructions   Ipratropium-Albuterol 20-100 MCG/ACT Aers respimat Commonly known as:  COMBIVENT Inhale 1 puff into the lungs every 6 (six) hours as needed for wheezing or shortness of breath.   losartan-hydrochlorothiazide 100-25 MG tablet Commonly known as:  HYZAAR Take 1 tablet by mouth daily.  nitroGLYCERIN 0.4 MG SL tablet Commonly known as:  NITROSTAT Place 1 tablet (0.4 mg total) under the tongue every 5 (five) minutes as needed for chest pain.   omeprazole 40 MG capsule Commonly known as:  PRILOSEC TAKE 1 CAPSULE (40 MG TOTAL) BY MOUTH 2 (TWO) TIMES DAILY.   oxyCODONE 5 MG immediate release tablet Commonly known as:  Oxy IR/ROXICODONE Take 1-2 tablets (5-10 mg total) by mouth every 4 (four) hours as needed.   potassium chloride 10 MEQ tablet Commonly known as:  K-DUR Take 2 tablets (20 mEq total) by mouth 2 (two) times daily.   RESTASIS 0.05 % ophthalmic emulsion Generic drug:  cycloSPORINE Place 1 drop into both eyes at bedtime.   tretinoin 0.1 % cream Commonly known as:  RETIN-A Apply 1 application topically every other day.   Vitamin D (Ergocalciferol) 2000 units Caps Take 1 capsule by mouth daily.       Diagnostic Studies: No results found.  Disposition:     Follow-up Information    Susa Day, MD Follow up in 2 week(s).   Specialty:  Orthopedic Surgery Contact information: [address] [place] [address] [phone]        [name], MD Follow up.   Specialty:  Family Medicine Contact information: [address] [place] [address] [phone]            Signed: Cecilie Kicks. 09/28/2018, 12:20 PM

## 2018-09-28 NOTE — Consult Note (Signed)
Medical Consultation   Megan [name]  RDE:[id number]  DOB: Feb 20, 1946  DOA: 09/27/2018  PCP: [name], MD  Outpatient Specialists:  Requesting physician: Dr. Susa Day   Reason for consultation: COPD    History of Present Illness: Megan [name] is an 72 y.o. female history of mild COPD, CAD and history of coronary artery bypass graft 25 years ago history of AAA grafting stent in 2009, type 2 diabetes and hypertension admitted for right shoulder rotator cuff repair.  She is now status post repair of the right shoulder.  She is in a sling.  I was called to consult this patient to manage her COPD.  Patient reports that she smoked 2 to 3 packets/day for 10 years and she quit smoking more than 22 years ago.  She used to use inhaler many years ago but has not had to use one in the last 8 years or so.  At baseline she has difficulty climbing stairs she feels short of breath occasionally she gets bronchitis but has not had a bronchitis in many years now.  She denies any chest pain coughing nausea vomiting diarrhea.  She reports her breathing treatments have helped her some.  Denies any abdominal pain urinary complaints fever chills headache.  She lives at home with her husband.      Review of Systems:  ROS see HPI   Past Medical History: Past Medical History:  Diagnosis Date  . AAA (abdominal aortic aneurysm) ([place])    a. s/p stent grafting in 2009.  . Adenomatous colon polyp 05/2001  . Adenomatous duodenal polyp   . Allergy   . Anginal pain ([place])   . COPD (chronic obstructive pulmonary disease) ([place])   . Coronary artery disease    a. s/p CABG in 1995;  b. 05/2013 Neg MV, EF 82%;  c. 06/2013 Cath: LM nl, LAD 40-50p, LCX nl, RCA 80p/170m, VG->RCA->PDA->PLSA min irregs, LIMA->LAD atretic, vigorous LV fxn.  . Diabetes mellitus without complication (HCC)    history of, resolved. 2017  . Diverticulosis   . Eczema, dyshidrotic   . Eczema, dyshidrotic 1986   Dr.  Mathis Fare  . Fibromyalgia   . GERD (gastroesophageal reflux disease)   . Hyperlipidemia   . Hypertension   . Migraine   . Myocardial infarction ([place])   . Osteoarthritis   . Peripheral arterial disease (HCC)    left leg diagnosed by Dr Mare Ferrari  . Renal artery stenosis ([place])    a. 2008 s/p PTA    Past Surgical History: Past Surgical History:  Procedure Laterality Date  . ABDOMINAL AORTAGRAM N/A 03/06/2014   Procedure: ABDOMINAL AORTAGRAM;  Surgeon: Rosetta Posner, MD;  Location[facility] CATH LAB;  Service: Cardiovascular;  Laterality: N/A;  . ABDOMINAL AORTIC ANEURYSM REPAIR  2009   stenting  . ABDOMINAL HYSTERECTOMY     Total, 1992  . BREAST BIOPSY    . CARDIAC CATHETERIZATION    . CARDIOVASCULAR STRESS TEST  11/11/2009   normal study  . CARPAL TUNNEL RELEASE  08/1993   left wrist  . CARPAL TUNNEL RELEASE  01/1997   right  . CORONARY ARTERY BYPASS GRAFT  07/1994   Triple by Dr Ceasar Mons  . dental implants    . HAND SURGERY Right 09/2002   Right hand pulley release  . LEFT HEART CATHETERIZATION WITH CORONARY/GRAFT ANGIOGRAM N/A 06/11/2013   Procedure: LEFT HEART CATHETERIZATION WITH CORONARY/GRAFT  ANGIOGRAM;  Surgeon: Thayer Headings, MD;  Location: [facility] CATH LAB;  Service: Cardiovascular;  Laterality: N/A;  . MULTIPLE TOOTH EXTRACTIONS  2000   All upper teeth.  Has full dneture.   [name] RENAL ARTERY STENT  2008  . thyroid cyst apiration  05/1997 and 07/1998  . TONSILECTOMY, ADENOIDECTOMY, BILATERAL MYRINGOTOMY AND TUBES  1989  . TOTAL ABDOMINAL HYSTERECTOMY  1992     Allergies:   Allergies  Allergen Reactions  . Anticoagulant Compound Itching and Other (See Comments)    Hands and feet tingle and itch. Hands and feet tingle and itch.  . Morphine And Related Nausea Only and Itching  . Bextra [Valdecoxib] Other (See Comments)    Increased lft's Increased lft's  . Hydrocod Polst-Cpm Polst Er Rash  . Lipitor [Atorvastatin] Other (See Comments)    Leg weakness Leg weakness  .  Mevacor [Lovastatin] Other (See Comments)    Muscle weakness Muscle weakness  . Plavix  [Clopidogrel Bisulfate] Itching and Other (See Comments)    Hands and feet tingle and itch.  . Zocor [Simvastatin] Other (See Comments)    Bones hurt Bones hurt   . Doxycycline Diarrhea    N/v/d  . Metoprolol Other (See Comments)    Hair loss  . Plavix [Clopidogrel Bisulfate] Other (See Comments)    Itching of hands and feet  . Codeine Itching and Rash    Hyper-active  . Hydrocod Polst-Cpm Polst Er Rash  . Lisinopril Cough     Social History:  reports that she quit smoking about 22 years ago. Her smoking use included cigarettes. She smoked 0.00 packs per day for 37.00 years. She has never used smokeless tobacco. She reports that she drinks about 6.0 - 10.0 standard drinks of alcohol per week. She reports that she does not use drugs.   Family History: Family History  Problem Relation Age of Onset  . Heart disease Mother   . Heart attack Mother   . Heart disease Father        before age 7  . Heart attack Father   . Heart disease Sister        before age 40  . Diabetes Sister   . Hyperlipidemia Sister   . Hypertension Sister   . Heart attack Sister   . Liver cancer Sister   . Breast cancer Sister   . Heart disease Brother   . Diabetes Brother   . Hyperlipidemia Brother   . Hypertension Brother   . Heart attack Brother   . AAA (abdominal aortic aneurysm) Brother   . Colon cancer Paternal Grandmother   . Heart disease Brother   . Pancreatic cancer Neg Hx   . Rectal cancer Neg Hx   . Stomach cancer Neg Hx   . Esophageal cancer Neg Hx      Physical Exam: Very pleasant young lady in no acute distress. Vitals:   09/28/18 0139 09/28/18 0445 09/28/18 0450 09/28/18 0558  BP: (!) 144/72   (!) 123/51  Pulse: 87 80 76 75  Resp: 15 20 20 19   Temp: (!) 97.3 F (36.3 C)   97.8 F (36.6 C)  TempSrc: Oral   Oral  SpO2: 95% 91% 96% 95%  Weight:      Height:         Constitutional: Alert and awake, oriented x3, not in any acute distress. Eyes: PERLA, EOMI, irises appear normal, anicteric sclera,  ENMT: external ears and nose appear normal,normal hearing  Lips appears normal, oropharynx mucosa, tongue, posterior pharynx appear normal  Neck: neck appears normal, no masses, normal ROM, no thyromegaly, no JVD  CVS: S1-S2 clear, no murmur rubs or gallops, no LE edema, normal pedal pulses  Respiratory: Few wheezes and few scattered rhonchi to auscultation bilaterally, no  rales or rhonchi. Respiratory effort normal. No accessory muscle use.  Abdomen: soft nontender, nondistended, normal bowel sounds, no hepatosplenomegaly, no hernias  Musculoskeletal: : no cyanosis, clubbing or edema noted bilaterally                       RIGHT SHOULDER SLING Neuro: Cranial nerves II-XII intact, strength, sensation, reflexes Psych: judgement and insight appear normal, stable mood and affect, mental status Skin: no rashes or lesions or ulcers, no induration or nodules  Data reviewed:  I have personally reviewed following labs and imaging studies Labs:  CBC: Recent Labs  Lab 09/21/18 1115 09/28/18 0500  WBC 8.4 15.9*  HGB 15.4* 14.2  HCT 46.5* 43.5  MCV 90.3 91.4  PLT 239 557    Basic Metabolic Panel: Recent Labs  Lab 09/21/18 1115 09/28/18 0500  NA 140 139  K 4.0 3.7  CL 104 103  CO2 27 25  GLUCOSE 104* 134*  BUN 8 12  CREATININE 0.67 0.74  CALCIUM 9.7 9.6   GFR Estimated Creatinine Clearance: 57.2 mL/min (by C-G formula based on SCr of 0.74 mg/dL). Liver Function Tests: No results for input(s): AST, ALT, ALKPHOS, BILITOT, PROT, ALBUMIN in the last 168 hours. No results for input(s): LIPASE, AMYLASE in the last 168 hours. No results for input(s): AMMONIA in the last 168 hours. Coagulation profile No results for input(s): INR, PROTIME in the last 168 hours.  Cardiac Enzymes: No results for input(s): CKTOTAL, CKMB, CKMBINDEX, TROPONINI in  the last 168 hours. BNP: Invalid input(s): POCBNP CBG: Recent Labs  Lab 09/21/18 1031 09/27/18 0551  GLUCAP 105* 117*   D-Dimer No results for input(s): DDIMER in the last 72 hours. Hgb A1c No results for input(s): HGBA1C in the last 72 hours. Lipid Profile No results for input(s): CHOL, HDL, LDLCALC, TRIG, CHOLHDL, LDLDIRECT in the last 72 hours. Thyroid function studies No results for input(s): TSH, T4TOTAL, T3FREE, THYROIDAB in the last 72 hours.  Invalid input(s): FREET3 Anemia work up No results for input(s): VITAMINB12, FOLATE, FERRITIN, TIBC, IRON, RETICCTPCT in the last 72 hours. Urinalysis    Component Value Date/Time   COLORURINE YELLOW 09/09/2015 1611   APPEARANCEUR CLEAR 09/09/2015 1611   LABSPEC 1.010 09/09/2015 1611   PHURINE 6.0 09/09/2015 1611   GLUCOSEU NEGATIVE 09/09/2015 1611   HGBUR NEGATIVE 09/09/2015 1611   BILIRUBINUR negative 01/13/2018 [place] 09/09/2015 1611   PROTEINUR negative 01/13/2018 1131   PROTEINUR NEGATIVE 12/18/2007 0556   UROBILINOGEN 0.2 01/13/2018 1131   UROBILINOGEN 0.2 09/09/2015 1611   NITRITE negative 01/13/2018 1131   NITRITE NEGATIVE 09/09/2015 1611   LEUKOCYTESUR Negative 01/13/2018 1131     Microbiology No results found for this or any previous visit (from the past 240 hour(s)).     Inpatient Medications:   Scheduled Meds: . acidophilus  1 capsule Oral Daily  . amLODipine  5 mg Oral Daily  . aspirin EC  81 mg Oral Daily  . cholecalciferol  2,000 Units Oral Daily  . cycloSPORINE  1 drop Both Eyes QHS  . docusate sodium  100 mg Oral BID  . estradiol  0.5 mg Oral Daily  . gabapentin  300 mg Oral TID  . hydrALAZINE  10 mg Oral TID  . losartan  100 mg Oral Daily   And  . hydrochlorothiazide  25 mg Oral Daily  . ipratropium-albuterol  3 mL Nebulization BID  . pantoprazole  40 mg Oral Daily  . potassium chloride SA  20 mEq Oral BID  . tretinoin  1 application Topical QODAY  . triamcinolone  cream   Topical BID   Continuous Infusions: . dextrose 5 % and 0.45 % NaCl with KCl 20 mEq/L Stopped (09/27/18 2352)  . methocarbamol (ROBAXIN) IV Stopped (09/27/18 1021)     Radiological Exams on Admission: No results found.  Impression/Recommendations Principal Problem:   Right rotator cuff tear Active Problems:   Complete rotator cuff tear   #1 mild COPD-patient has not had an exacerbation for the last 7 to 8 years.  Very rarely she gets an attack of bronchitis.  I will prescribe her Combivent which should be enough to control her symptoms if she does have any during the postop phase when she gets home.  She does not have oxygen at home.  She reports she ambulated around the block with a therapist and did well.  She has not had any hypoxic episodes.  I will add DuoNeb to her AVS for  discharge today.  #2 type 2 dm not on any medications at home diet-controlled.  #3  Hypertension- patient takes Norvasc hydralazine, Hyzaar.  Her blood pressure has been erratic the last blood pressure have is soft consider DC hydralazine upon discharge and continue Norvasc and Hyzaar.  Please have her check her blood pressure daily and write it down and take it to PCP upon visit.  #4hyperlipedemia continue Lipitor.  #5  History of CAD and CABG continue cardiac medications I have reviewed her home med list.  Follow-up with cardiologist as needed. Thank you for this consultation.   Georgette Shell M.D. [facility] 09/28/2018, 8:40 AM

## 2018-09-28 NOTE — Evaluation (Addendum)
Occupational Therapy Evaluation Patient Details Name: Megan [name] MRN: [id number] DOB: May 04, 1946 Today's Date: 09/28/2018    History of Present Illness Megan [name] is an 73 y.o. female history of mild COPD, CAD and history of coronary artery bypass graft 25 years ago history of AAA grafting stent in 2009, type 2 diabetes and hypertension admitted for right shoulder rotator cuff repair.  She is now status post repair of the right rotator    Clinical Impression   Pt admitted for RTC repain. Pt currently with functional limitations due to the deficits listed below (see OT Problem List).  Pt will benefit from skilled OT to increase their safety and independence with ADL and functional mobility for ADL to facilitate discharge to venue listed below.      Follow Up Recommendations  Follow surgeon's recommendation for DC plan and follow-up therapies    Equipment Recommendations  None recommended by OT    Recommendations for Other Services       Precautions / Restrictions Precautions Precautions: Fall Required Braces or Orthoses: Sling Restrictions RUE Weight Bearing: Non weight bearing   Ok for pt to perform pendulums, elbow ROM as well as wrist and hand     Mobility Bed Mobility          I        Transfers          Independent                ADL either performed or assessed with clinical judgement                      Pertinent Vitals/Pain Pain Assessment: 0-10 Pain Score: 2  Pain Location: R shoulder Pain Descriptors / Indicators: Sore Pain Intervention(s): Limited activity within patient's tolerance;Repositioned;Monitored during session           Communication Communication Communication: No difficulties   Cognition Arousal/Alertness: Awake/alert Behavior During Therapy: WFL for tasks assessed/performed Overall Cognitive Status: Within Functional Limits for tasks assessed                                         Exercises Shoulder Exercises Pendulum Exercise: 10 reps;Standing;Right Elbow Flexion: AAROM;Right Elbow Extension: AAROM;Right Wrist Flexion: AROM;10 reps;Right Wrist Extension: Right;AROM;10 reps Digit Composite Flexion: AROM;Right Composite Extension: AROM;Right   Shoulder Instructions Shoulder Instructions Donning/doffing shirt without moving shoulder: Minimal assistance;Patient able to independently direct caregiver Method for sponge bathing under operated UE: Minimal assistance;Caregiver independent with task Donning/doffing sling/immobilizer: Minimal assistance;Patient able to independently direct caregiver Correct positioning of sling/immobilizer: Minimal assistance;Caregiver independent with task Pendulum exercises (written home exercise program): Minimal assistance;Patient able to independently direct caregiver ROM for elbow, wrist and digits of operated UE: Minimal assistance;Patient able to independently direct caregiver Proper positioning of operated UE when showering: Minimal assistance;Patient able to independently direct caregiver Positioning of UE while sleeping: Minimal assistance;Patient able to independently direct caregiver    Home Living Family/patient expects to be discharged to:: Private residence Living Arrangements: Spouse/significant other   Type of Home: [place]: One level     Bathroom Shower/Tub: Teacher, early years/pre: [place]: None          Prior Functioning/Environment Level of Independence: Independent  OT Problem List: Decreased strength;Decreased knowledge of precautions;Impaired UE functional use      OT Treatment/Interventions: Self-care/ADL training;Patient/family education;Therapeutic exercise    OT Goals(Current goals can be found in the care plan section) Acute Rehab OT Goals Patient Stated Goal: get home OT Goal Formulation: With patient Time For Goal  Achievement: 10/05/18 Potential to Achieve Goals: Good ADL Goals Pt Will Perform Upper Body Dressing: with min assist;sitting Pt Will Transfer to Toilet: with supervision;ambulating Pt Will Perform Toileting - Clothing Manipulation and hygiene: with supervision;sit to/from stand Pt/caregiver will Perform Home Exercise Program: With written HEP provided;Right Upper extremity  OT Frequency: Min 2X/week   Barriers to D/C:               AM-PAC PT "6 Clicks" Daily Activity     Outcome Measure Help from another person eating meals?: A Little Help from another person taking care of personal grooming?: A Little Help from another person toileting, which includes using toliet, bedpan, or urinal?: A Little Help from another person bathing (including washing, rinsing, drying)?: A Little Help from another person to put on and taking off regular upper body clothing?: A Little Help from another person to put on and taking off regular lower body clothing?: A Little 6 Click Score: 18   End of Session Nurse Communication: Mobility status  Activity Tolerance: Patient tolerated treatment well Patient left: in chair;with call bell/phone within reach;with family/visitor present;with nursing/sitter in room  OT Visit Diagnosis: Unsteadiness on feet (R26.81);Muscle weakness (generalized) (M62.81)                Time: 5498-2641 OT Time Calculation (min): 25 min Charges:  OT General Charges $OT Visit: 1 Visit OT Evaluation $OT Eval Moderate Complexity: 1 Mod  Kari Baars, [place] [phone] Office- [phone]     Falen Lehrmann, Edwena Felty D 09/28/2018, 10:42 AM

## 2018-09-28 NOTE — Progress Notes (Signed)
Subjective: 1 Day Post-Op Procedure(s) (LRB): Right shoulder mini open rotator cuff repair (Right) Patient reports pain as mild.   Doing well. Minimal tingling in tips of thumb and index finger otherwise block worn off. Ready to go home.  Objective: Vital signs in last 24 hours: Temp:  [97.3 F (36.3 C)-98 F (36.7 C)] 98 F (36.7 C) (10/24 0932) Pulse Rate:  [75-88] 88 (10/24 0932) Resp:  [15-24] 18 (10/24 0932) BP: (123-161)/(51-90) 144/64 (10/24 0932) SpO2:  [82 %-100 %] 95 % (10/24 1146)  Intake/Output from previous day: 10/23 0701 - 10/24 0700 In: 2520.9 [P.O.:640; I.V.:1530.9; IV Piggyback:350] Out: 25 [Blood:25] Intake/Output this shift: Total I/O In: 120 [P.O.:120] Out: -   Recent Labs    09/28/18 0500  HGB 14.2   Recent Labs    09/28/18 0500  WBC 15.9*  RBC 4.76  HCT 43.5  PLT 197   Recent Labs    09/28/18 0500  NA 139  K 3.7  CL 103  CO2 25  BUN 12  CREATININE 0.74  GLUCOSE 134*  CALCIUM 9.6   No results for input(s): LABPT, INR in the last 72 hours.  Neurologically intact ABD soft Neurovascular intact Sensation intact distally Intact pulses distally Dorsiflexion/Plantar flexion intact Incision: dressing C/D/I and no drainage No cellulitis present Compartment soft no calf pain or sign of DVT  Assessment/Plan: 1 Day Post-Op Procedure(s) (LRB): Right shoulder mini open rotator cuff repair (Right) Advance diet Up with therapy D/C IV fluids Hospitalist recommendations noted and appreciated Hold hydralazine at home if needed for low BP, pt to monitor BP and report them to PCP, follow up with PCP Combivent added for at home D/C home today Discussed with Dr. Mliss Fritz, [place]. 09/28/2018, 12:18 PM

## 2018-09-28 NOTE — Progress Notes (Signed)
Patient discharged to home w/ husband. Discharged w/ all belongings, instructions, prescriptions, equipment. Patient and husband verbalized understanding of all instructions. Escorted to pov via w/c.

## 2018-09-28 NOTE — Discharge Instructions (Signed)
Aquacel dressing may remain in place until follow up. May shower with aquacel dressing in place. If the dressing becomes saturated or peels off, you may remove it and place a new dressing with gauze and tape which should be kept clean and dry and changed daily. Use sling at times except when exercising or showering No driving for 4-6 weeks No lifting for 6 weeks operative arm Pendulum exercises as instructed. Ok to move wrist, elbow, and hand. See Dr. Tonita Cong in 10-14 days. Take one aspirin per day with a meal if not on a blood thinner or allergic to aspirin.  Track blood pressures daily and keep records for PCP

## 2018-10-03 ENCOUNTER — Encounter: Payer: Self-pay | Admitting: Family Medicine

## 2018-10-03 ENCOUNTER — Other Ambulatory Visit: Payer: Self-pay

## 2018-10-03 ENCOUNTER — Ambulatory Visit (INDEPENDENT_AMBULATORY_CARE_PROVIDER_SITE_OTHER): Payer: Medicare Other | Admitting: Family Medicine

## 2018-10-03 VITALS — BP 120/82 | HR 81 | Temp 98.2°F | Resp 16 | Ht 61.0 in | Wt 166.0 lb

## 2018-10-03 DIAGNOSIS — Z9889 Other specified postprocedural states: Secondary | ICD-10-CM | POA: Diagnosis not present

## 2018-10-03 DIAGNOSIS — Z79890 Hormone replacement therapy (postmenopausal): Secondary | ICD-10-CM

## 2018-10-03 DIAGNOSIS — J441 Chronic obstructive pulmonary disease with (acute) exacerbation: Secondary | ICD-10-CM | POA: Diagnosis not present

## 2018-10-03 MED ORDER — GUAIFENESIN-CODEINE 100-10 MG/5ML PO SOLN: 5.0000 mL | Freq: Four times a day (QID) | ORAL | 0 refills | Status: DC | PRN

## 2018-10-03 MED ORDER — AZITHROMYCIN 250 MG PO TABS: ORAL_TABLET | ORAL | 0 refills | Status: DC

## 2018-10-03 MED ORDER — PREDNISONE 20 MG PO TABS: ORAL_TABLET | ORAL | 0 refills | Status: DC

## 2018-10-03 MED ORDER — ATORVASTATIN CALCIUM 80 MG PO TABS: 80.0000 mg | ORAL_TABLET | Freq: Every day | ORAL | Status: DC

## 2018-10-03 NOTE — Patient Instructions (Signed)
Please return in 2 - 3 weeks for recheck lungs/cough.  If you have any questions or concerns, please don't hesitate to send me a message via MyChart or call the office at [phone]. Thank you for visiting with [place] today! It's our pleasure caring for you.  Take benadryl 15-20 minutes before your cough syrup. Stop if you get a rash!

## 2018-10-03 NOTE — Progress Notes (Signed)
Subjective  CC:  Chief Complaint  Patient presents with  . Cough    has had some weezing and dry cough x 2 days    HPI: Megan [name] is a 72 y.o. female who presents to the office today to address the problems listed above in the chief complaint.  72 year old with COPD presents due to worsening cough, at times productive and wheezing with some chest tightness.  Symptoms started over the last 24 to 48 hours.  She is almost a week status post right rotator cuff repair.  She does report that she had some problems with hypoxia during her overnight stay.  They thought this was related to the nerve block.  She has been given Combivent to use in the interim.  I reviewed the discharge summary.  I reviewed recent labs.  Of note, her white blood count was 15.9 at the time of admission.  She denies recent steroid use.  She also denies fevers, chills, malaise, sore throat, nausea, vomiting or abdominal pain.  She reports that she did recently have a mild bout of diverticulitis for which she took Cipro prior to her surgery.  HRT: She ran out of medicines about a month ago.  Had been on this for quality of life, hot flashes.  However since being off she is had less leg cramps.  She is not suffering from intolerable hot flashes.  She is considering stopping it.  Assessment  1. COPD with acute exacerbation ([place])   2. S/P rotator cuff repair   3. Hormone replacement therapy (HRT)      Plan   COPD with exacerbation: Mild wheezing on exam.  No rales.  Treat with prednisone burst and Z-Pak due to recent hospitalization.  Patient will continue with her inhalers.  She will follow-up in 1 to 2 weeks to ensure everything is stable.  Return sooner if breathing worsens or she develops fevers.  HRT: High risk due to cardiovascular disease but cardiology was fine with her taking long-term HRT.  Currently doing well off of it.  We will continue to hold for now.  Will only reinstitute if hot flashes  return.  Follow up: Return in about 2 weeks (around 10/17/2018) for recheck.   No orders of the defined types were placed in this encounter.  Meds ordered this encounter  Medications  . azithromycin (ZITHROMAX) 250 MG tablet    Sig: Take 2 tabs today, then 1 tab daily for 4 days    Dispense:  1 each    Refill:  0  . guaiFENesin-codeine 100-10 MG/5ML syrup    Sig: Take 5 mLs by mouth every 6 (six) hours as needed for cough.    Dispense:  120 mL    Refill:  0  . predniSONE (DELTASONE) 20 MG tablet    Sig: Take 3 tabs daily for 5 days    Dispense:  15 tablet    Refill:  0  . atorvastatin (LIPITOR) 80 MG tablet    Sig: Take 1 tablet (80 mg total) by mouth at bedtime.      I reviewed the patients updated PMH, FH, and SocHx.    Patient Active Problem List   Diagnosis Date Noted  . Essential hypertension 07/10/2018    Priority: High  . IFG (impaired fasting glucose) 11/10/2017    Priority: High  . 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 Coronary artery disease     Priority: High  . Peripheral vascular disease with  claudication [facility])     Priority: High  . Benign hypertensive heart disease without heart failure 05/18/2011    Priority: High  . History of abdominal aortic aneurysm repair 05/18/2011    Priority: High  . Mixed hyperlipidemia     Priority: High  . DJD (degenerative joint disease), lumbosacral 11/10/2017    Priority: Medium  . Fibromyalgia 11/10/2017    Priority: Medium  . GERD (gastroesophageal reflux disease) 11/10/2017    Priority: Medium  . Hormone replacement therapy (HRT) 11/10/2017    Priority: Medium  . Fatty liver 11/10/2017    Priority: Medium  . Diverticulosis 09/19/2012    Priority: Medium  . History of renal artery stenosis 05/18/2011    Priority: Medium  . Dyshidrotic eczema 11/10/2017    Priority: Low  . Osteopenia 11/10/2017    Priority: Low  . Does use hearing aid 11/10/2017    Priority: Low  . COPD with chronic bronchitis  ([place]) 09/28/2018  . Right rotator cuff tear 09/27/2018  . Complete rotator cuff tear 09/27/2018   Current Meds  Medication Sig  . ALPRAZolam (XANAX) 0.25 MG tablet TAKE 1 TABLET BY MOUTH AT BEDTIME AS NEEDED  . amLODipine (NORVASC) 5 MG tablet TAKE 1 TABLET BY MOUTH EVERY DAY  . aspirin EC 81 MG tablet Take 1 tablet (81 mg total) by mouth daily.  [name] atorvastatin (LIPITOR) 80 MG tablet Take 1 tablet (80 mg total) by mouth at bedtime.  [name] augmented betamethasone dipropionate (DIPROLENE-AF) 0.05 % cream Apply 1 application topically daily as needed. For hands  . ciclopirox (LOPROX) 0.77 % cream Apply 1 application topically daily as needed (hands).   . clindamycin (CLEOCIN T) 1 % lotion Apply 1 application topically daily as needed (apply to face).   . clobetasol cream (TEMOVATE) 1.61 % Apply 1 application topically 2 (two) times daily as needed (hands).   . cycloSPORINE (RESTASIS) 0.05 % ophthalmic emulsion Place 1 drop into both eyes at bedtime.  . gabapentin (NEURONTIN) 300 MG capsule Take 1 capsule (300 mg total) by mouth 3 (three) times daily.  . hydrALAZINE (APRESOLINE) 10 MG tablet Take 1 tablet (10 mg total) by mouth 3 (three) times daily. Hold if blood pressure remains low  . Ipratropium-Albuterol (COMBIVENT) 20-100 MCG/ACT AERS respimat Inhale 1 puff into the lungs every 6 (six) hours as needed for wheezing or shortness of breath.  . losartan-hydrochlorothiazide (HYZAAR) 100-25 MG tablet Take 1 tablet by mouth daily.  . nitroGLYCERIN (NITROSTAT) 0.4 MG SL tablet Place 1 tablet (0.4 mg total) under the tongue every 5 (five) minutes as needed for chest pain.  [name] omeprazole (PRILOSEC) 40 MG capsule TAKE 1 CAPSULE (40 MG TOTAL) BY MOUTH 2 (TWO) TIMES DAILY.  [name] oxyCODONE (ROXICODONE) 5 MG immediate release tablet Take 1-2 tablets (5-10 mg total) by mouth every 4 (four) hours as needed.  . potassium chloride (K-DUR) 10 MEQ tablet Take 2 tablets (20 mEq total) by mouth 2 (two) times daily.  [name]  tretinoin (RETIN-A) 0.1 % cream Apply 1 application topically every other day.  . Vitamin D, Ergocalciferol, 2000 units CAPS Take 1 capsule by mouth daily.   . [DISCONTINUED] atorvastatin (LIPITOR) 80 MG tablet Take 1 tablet (80 mg total) by mouth daily. (Patient taking differently: Take 80 mg by mouth at bedtime. )  . [DISCONTINUED] estradiol (ESTRACE) 0.5 MG tablet Take 1 tablet (0.5 mg total) by mouth daily.    Allergies: Patient is allergic to anticoagulant compound; morphine and related; bextra [valdecoxib]; hydrocod polst-cpm  polst er; lipitor [atorvastatin]; mevacor [lovastatin]; plavix  [clopidogrel bisulfate]; zocor [simvastatin]; doxycycline; metoprolol; plavix [clopidogrel bisulfate]; codeine; hydrocod polst-cpm polst er; and lisinopril. Family History: Patient family history includes AAA (abdominal aortic aneurysm) in her brother; Breast cancer in her sister; Colon cancer in her paternal grandmother; Diabetes in her brother and sister; Heart attack in her brother, father, mother, and sister; Heart disease in her brother, brother, father, mother, and sister; Hyperlipidemia in her brother and sister; Hypertension in her brother and sister; Liver cancer in her sister. Social History:  Patient  reports that she quit smoking about 22 years ago. Her smoking use included cigarettes. She smoked 0.00 packs per day for 37.00 years. She has never used smokeless tobacco. She reports that she drinks about 6.0 - 10.0 standard drinks of alcohol per week. She reports that she does not use drugs.  Review of Systems: Constitutional: Negative for fever malaise or anorexia Cardiovascular: negative for chest pain, negative for lower extremity edema, orthopnea or PND Respiratory: Positive for SOB or persistent cough Gastrointestinal: negative for abdominal pain  Objective  Vitals: BP 120/82   Pulse 81   Temp 98.2 F (36.8 C)   Resp 16   Ht 5\' 1"  (1.549 m)   Wt 166 lb (75.3 kg)   SpO2 92%   BMI  31.37 kg/m  General: no acute distress , A&Ox3, no tachypnea HEENT: PEERL, conjunctiva normal, Oropharynx moist,neck is supple Cardiovascular:  RRR without murmur or gallop.  Respiratory: Fair breath sounds bilaterally, left lower lobe with some expiratory wheezes.  No rales fair air movement Lower extremities: No edema Musculoskeletal: Wearing right arm sling  skin:  Warm, no rashes  No visits with results within 1 Day(s) from this visit.  Latest known visit with results is:  Admission on 09/27/2018, Discharged on 09/28/2018  Component Date Value Ref Range Status  . Glucose-Capillary 09/27/2018 117* 70 - 99 mg/dL Final  . Comment 1 09/27/2018 Notify RN   Final  . Comment 2 09/27/2018 Document in Chart   Final  . WBC 09/28/2018 15.9* 4.0 - 10.5 K/uL Final  . RBC 09/28/2018 4.76  3.87 - 5.11 MIL/uL Final  . Hemoglobin 09/28/2018 14.2  12.0 - 15.0 g/dL Final  . HCT 09/28/2018 43.5  36.0 - 46.0 % Final  . MCV 09/28/2018 91.4  80.0 - 100.0 fL Final  . MCH 09/28/2018 29.8  26.0 - 34.0 pg Final  . MCHC 09/28/2018 32.6  30.0 - 36.0 g/dL Final  . RDW 09/28/2018 12.3  11.5 - 15.5 % Final  . Platelets 09/28/2018 197  150 - 400 K/uL Final  . nRBC 09/28/2018 0.0  0.0 - 0.2 % Final  . Sodium 09/28/2018 139  135 - 145 mmol/L Final  . Potassium 09/28/2018 3.7  3.5 - 5.1 mmol/L Final  . Chloride 09/28/2018 103  98 - 111 mmol/L Final  . CO2 09/28/2018 25  22 - 32 mmol/L Final  . Glucose, Bld 09/28/2018 134* 70 - 99 mg/dL Final  . BUN 09/28/2018 12  8 - 23 mg/dL Final  . Creatinine, Ser 09/28/2018 0.74  0.44 - 1.00 mg/dL Final  . Calcium 09/28/2018 9.6  8.9 - 10.3 mg/dL Final  . GFR calc non Af Amer 09/28/2018 >60  >60 mL/min Final  . GFR calc Af Amer 09/28/2018 >60  >60 mL/min Final  . Anion gap 09/28/2018 11  5 - 15 Final       Commons side effects, risks, benefits, and alternatives for medications and treatment  plan prescribed today were discussed, and the patient expressed understanding  of the given instructions. Patient is instructed to call or message via MyChart if he/she has any questions or concerns regarding our treatment plan. No barriers to understanding were identified. We discussed Red Flag symptoms and signs in detail. Patient expressed understanding regarding what to do in case of urgent or emergency type symptoms.   Medication list was reconciled, printed and provided to the patient in AVS. Patient instructions and summary information was reviewed with the patient as documented in the AVS. This note was prepared with assistance of Dragon voice recognition software. Occasional wrong-word or sound-a-like substitutions may have occurred due to the inherent limitations of voice recognition software

## 2018-10-04 ENCOUNTER — Encounter (HOSPITAL_COMMUNITY): Payer: Self-pay | Admitting: Specialist

## 2018-10-09 ENCOUNTER — Ambulatory Visit: Payer: Medicare Other | Admitting: Diagnostic Neuroimaging

## 2018-10-09 ENCOUNTER — Encounter

## 2018-10-11 ENCOUNTER — Other Ambulatory Visit: Payer: Self-pay

## 2018-10-11 ENCOUNTER — Other Ambulatory Visit: Payer: Self-pay | Admitting: Family Medicine

## 2018-10-11 NOTE — Telephone Encounter (Signed)
Copied from [place] [phone]. Topic: Quick Communication - Rx Refill/Question >> Oct 11, 2018  3:56 PM Selinda Flavin B, NT wrote: Medication: estradiol (ESTRACE) tablet 0.5 mg  Has the patient contacted their pharmacy? No. Decided she wanted to start back on this medication and needs a new prescription (Agent: If no, request that the patient contact the pharmacy for the refill.) (Agent: If yes, when and what did the pharmacy advise?)  Preferred Pharmacy (with phone number or street name): [id number] - [place], [place] [address]  Agent: Please be advised that RX refills may take up to 3 business days. We ask that you follow-up with your pharmacy.

## 2018-10-11 NOTE — Telephone Encounter (Signed)
Requested medication (s) are due for refill today -tes  Requested medication (s) are on the active medication list -yes  Future visit scheduled -no  Last refill: 11 months ago  Notes to clinic: Patient had run out of medication and thought she could do without- she has decided she wants to restart. Patient needs new Rx from PCP. Sent for provider review before refill.  Requested Prescriptions  Pending Prescriptions Disp Refills   estradiol (ESTRACE) 0.5 MG tablet 90 tablet 3    Sig: Take 1 tablet (0.5 mg total) by mouth daily.     OB/GYN:  Estrogens Passed - 10/11/2018  4:05 PM      Passed - Mammogram is up-to-date per Health Maintenance      Passed - Last BP in normal range    BP Readings from Last 1 Encounters:  10/03/18 120/82         Passed - Valid encounter within last 12 months    Recent Outpatient Visits          1 week ago COPD with acute exacerbation [facility])   [place] [facility] [place], Karie Fetch, MD   3 months ago Benign hypertensive heart disease without heart failure   [place] [facility] [place], [place]   9 months ago Annual physical exam   [facility] [place], [place]   11 months ago Essential hypertension   [place] [facility] [place], Karie Fetch, MD   12 months ago Acute bacterial bronchitis   [place] [facility] [place], Luanna Cole, [place]      Future Appointments            In 3 months  [facility] [place], [place]   In 3 months [name], MD [place] [facility] [place], [facility]            Requested Prescriptions  Pending Prescriptions Disp Refills   estradiol (ESTRACE) 0.5 MG tablet 90 tablet 3    Sig: Take 1 tablet (0.5 mg total) by mouth daily.     OB/GYN:  Estrogens Passed - 10/11/2018  4:05 PM      Passed - Mammogram is  up-to-date per Health Maintenance      Passed - Last BP in normal range    BP Readings from Last 1 Encounters:  10/03/18 120/82         Passed - Valid encounter within last 12 months    Recent Outpatient Visits          1 week ago COPD with acute exacerbation [facility])   [place] [facility] [place], [place]   3 months ago Benign hypertensive heart disease without heart failure   [place] [facility] [place], [place]   9 months ago Annual physical exam   [facility] [place], [place]   11 months ago Essential hypertension   [facility] [place], [place]   12 months ago Acute bacterial bronchitis   [place] [facility] [place], Luanna Cole, [place]      Future Appointments            In 3 months  [facility], [place]   In 3 months [name], MD [facility] [place], [facility]

## 2018-10-12 MED ORDER — ESTRADIOL 0.5 MG PO TABS: 0.5000 mg | ORAL_TABLET | Freq: Every day | ORAL | 3 refills | Status: DC

## 2018-10-16 ENCOUNTER — Other Ambulatory Visit: Payer: Self-pay | Admitting: Family Medicine

## 2018-10-16 DIAGNOSIS — M25611 Stiffness of right shoulder, not elsewhere classified: Secondary | ICD-10-CM | POA: Diagnosis not present

## 2018-10-24 ENCOUNTER — Ambulatory Visit: Payer: Medicare Other | Admitting: Family Medicine

## 2018-10-25 ENCOUNTER — Other Ambulatory Visit: Payer: Self-pay | Admitting: *Deleted

## 2018-10-26 ENCOUNTER — Other Ambulatory Visit: Payer: Self-pay | Admitting: *Deleted

## 2018-10-26 DIAGNOSIS — M25611 Stiffness of right shoulder, not elsewhere classified: Secondary | ICD-10-CM | POA: Diagnosis not present

## 2018-10-30 DIAGNOSIS — M25611 Stiffness of right shoulder, not elsewhere classified: Secondary | ICD-10-CM | POA: Diagnosis not present

## 2018-11-01 DIAGNOSIS — M25611 Stiffness of right shoulder, not elsewhere classified: Secondary | ICD-10-CM | POA: Diagnosis not present

## 2018-11-07 DIAGNOSIS — M25611 Stiffness of right shoulder, not elsewhere classified: Secondary | ICD-10-CM | POA: Diagnosis not present

## 2018-11-09 DIAGNOSIS — M25611 Stiffness of right shoulder, not elsewhere classified: Secondary | ICD-10-CM | POA: Diagnosis not present

## 2018-11-09 DIAGNOSIS — H1013 Acute atopic conjunctivitis, bilateral: Secondary | ICD-10-CM | POA: Diagnosis not present

## 2018-11-13 ENCOUNTER — Other Ambulatory Visit: Payer: Self-pay

## 2018-11-13 ENCOUNTER — Encounter: Payer: Self-pay | Admitting: Family Medicine

## 2018-11-13 ENCOUNTER — Ambulatory Visit (INDEPENDENT_AMBULATORY_CARE_PROVIDER_SITE_OTHER): Payer: Medicare Other | Admitting: Family Medicine

## 2018-11-13 VITALS — BP 128/62 | HR 80 | Temp 98.0°F | Ht 61.0 in | Wt 166.0 lb

## 2018-11-13 DIAGNOSIS — J441 Chronic obstructive pulmonary disease with (acute) exacerbation: Secondary | ICD-10-CM

## 2018-11-13 DIAGNOSIS — J208 Acute bronchitis due to other specified organisms: Secondary | ICD-10-CM

## 2018-11-13 MED ORDER — ALBUTEROL SULFATE HFA 108 (90 BASE) MCG/ACT IN AERS: 2.0000 | INHALATION_SPRAY | RESPIRATORY_TRACT | 2 refills | Status: DC | PRN

## 2018-11-13 MED ORDER — PREDNISONE 10 MG PO TABS: ORAL_TABLET | ORAL | 0 refills | Status: DC

## 2018-11-13 NOTE — Patient Instructions (Signed)
See you in February as scheduled.   Use the prednisone IF your breathing or wheezing worsens. Send me a note if in a week you are worsening for an antibiotic.   Use your combivent twice dailly.  Use your albuterol (the new one) as needed for wheezing.

## 2018-11-13 NOTE — Progress Notes (Signed)
Subjective  CC:  Chief Complaint  Patient presents with  . Cough    rarely the cough is productive, x 2 days    HPI: SUBJECTIVE:  Megan [name] is a 72 y.o. female with history of COPD with most recent COPD exacerbation treated with antibiotics and prednisone at the end of October who complains of congestion, nasal blockage, post nasal drip, cough described as Mild and intermittently productive and denies sinus, high fevers, SOB, chest pain or significant GI symptoms. Symptoms have been present for 2 to 3 days. She denies a history of anorexia, dizziness, vomiting.  She has used Combivent to help with her wheezing which was present last night, it helped significantly.  She denies chest pain or palpitations.  No myalgias. Assessment  1. Viral bronchitis   2. COPD exacerbation ([place])      Plan  Discussion:  Suspect viral bronchitis with COPD exacerbation, mild.  Discussed starting albuterol as a rescue inhaler.  Use Combivent twice a day as a maintenance inhaler.  Treat supportively.  Gave prescription for prednisone taper if wheezing worsens.  Avoid antibiotics for now.  Education regarding differences between viral and bacterial infections and treatment options are discussed.  Supportive care measures are recommended.  We discussed the use of mucolytic's, decongestants, antihistamines and antitussives as needed.  Tylenol or Advil are recommended if needed.  Follow up: Return if symptoms worsen or fail to improve.   No orders of the defined types were placed in this encounter.  Meds ordered this encounter  Medications  . albuterol (PROVENTIL HFA;VENTOLIN HFA) 108 (90 Base) MCG/ACT inhaler    Sig: Inhale 2 puffs into the lungs every 4 (four) hours as needed for wheezing or shortness of breath.    Dispense:  1 Inhaler    Refill:  2  . predniSONE (DELTASONE) 10 MG tablet    Sig: Take 4 tabs qd x 2 days, 3 qd x 2 days, 2 qd x 2d, 1qd x 3 days    Dispense:  21 tablet    Refill:  0      I reviewed the patients updated PMH, FH, and SocHx.  Social History: Patient  reports that she quit smoking about 22 years ago. Her smoking use included cigarettes. She smoked 0.00 packs per day for 37.00 years. She has never used smokeless tobacco. She reports that she drinks about 6.0 - 10.0 standard drinks of alcohol per week. She reports that she does not use drugs.  Patient Active Problem List   Diagnosis Date Noted  . Essential hypertension 07/10/2018    Priority: High  . IFG (impaired fasting glucose) 11/10/2017    Priority: High  . 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 Coronary artery disease     Priority: High  . Peripheral vascular disease with claudication ([place])     Priority: High  . Benign hypertensive heart disease without heart failure 05/18/2011    Priority: High  . History of abdominal aortic aneurysm repair 05/18/2011    Priority: High  . Mixed hyperlipidemia     Priority: High  . DJD (degenerative joint disease), lumbosacral 11/10/2017    Priority: Medium  . Fibromyalgia 11/10/2017    Priority: Medium  . GERD (gastroesophageal reflux disease) 11/10/2017    Priority: Medium  . Hormone replacement therapy (HRT) 11/10/2017    Priority: Medium  . Fatty liver 11/10/2017    Priority: Medium  . Diverticulosis 09/19/2012    Priority: Medium  . History  of renal artery stenosis 05/18/2011    Priority: Medium  . Dyshidrotic eczema 11/10/2017    Priority: Low  . Osteopenia 11/10/2017    Priority: Low  . Does use hearing aid 11/10/2017    Priority: Low  . COPD with chronic bronchitis ([place]) 09/28/2018  . Right rotator cuff tear 09/27/2018  . Complete rotator cuff tear 09/27/2018    Review of Systems: Cardiovascular: negative for chest pain Respiratory: negative for SOB or hemoptysis, no pleuritic chest pain Gastrointestinal: negative for abdominal pain Genitourinary: negative for dysuria or gross hematuria Current Meds  Medication Sig  .  ALPRAZolam (XANAX) 0.25 MG tablet TAKE 1 TABLET BY MOUTH AT BEDTIME AS NEEDED  . amLODipine (NORVASC) 5 MG tablet TAKE 1 TABLET BY MOUTH EVERY DAY  . aspirin EC 81 MG tablet Take 1 tablet (81 mg total) by mouth daily.  [name] atorvastatin (LIPITOR) 80 MG tablet Take 1 tablet (80 mg total) by mouth at bedtime.  [name] augmented betamethasone dipropionate (DIPROLENE-AF) 0.05 % cream Apply 1 application topically daily as needed. For hands  . ciclopirox (LOPROX) 0.77 % cream Apply 1 application topically daily as needed (hands).   . clindamycin (CLEOCIN T) 1 % lotion Apply 1 application topically daily as needed (apply to face).   . clobetasol cream (TEMOVATE) 3.29 % Apply 1 application topically 2 (two) times daily as needed (hands).   . cycloSPORINE (RESTASIS) 0.05 % ophthalmic emulsion Place 1 drop into both eyes at bedtime.  [name] estradiol (ESTRACE) 0.5 MG tablet TAKE TWO TABLETS (1 MG TOTAL) BY MOUTH DAILY.  [name] gabapentin (NEURONTIN) 300 MG capsule Take 1 capsule (300 mg total) by mouth 3 (three) times daily.  [name] guaiFENesin-codeine 100-10 MG/5ML syrup Take 5 mLs by mouth every 6 (six) hours as needed for cough.  . hydrALAZINE (APRESOLINE) 10 MG tablet Take 1 tablet (10 mg total) by mouth 3 (three) times daily. Hold if blood pressure remains low  . Ipratropium-Albuterol (COMBIVENT) 20-100 MCG/ACT AERS respimat Inhale 1 puff into the lungs every 6 (six) hours as needed for wheezing or shortness of breath.  . losartan-hydrochlorothiazide (HYZAAR) 100-25 MG tablet Take 1 tablet by mouth daily.  . nitroGLYCERIN (NITROSTAT) 0.4 MG SL tablet Place 1 tablet (0.4 mg total) under the tongue every 5 (five) minutes as needed for chest pain.  [name] omeprazole (PRILOSEC) 40 MG capsule TAKE 1 CAPSULE (40 MG TOTAL) BY MOUTH 2 (TWO) TIMES DAILY.  [name] oxyCODONE (ROXICODONE) 5 MG immediate release tablet Take 1-2 tablets (5-10 mg total) by mouth every 4 (four) hours as needed.  . potassium chloride (K-DUR) 10 MEQ tablet Take 2 tablets  (20 mEq total) by mouth 2 (two) times daily.  [name] tretinoin (RETIN-A) 0.1 % cream Apply 1 application topically every other day.  . Vitamin D, Ergocalciferol, 2000 units CAPS Take 1 capsule by mouth daily.   . [DISCONTINUED] azithromycin (ZITHROMAX) 250 MG tablet Take 2 tabs today, then 1 tab daily for 4 days  . [DISCONTINUED] predniSONE (DELTASONE) 20 MG tablet Take 3 tabs daily for 5 days    Objective  Vitals: BP 128/62   Pulse 80   Temp 98 F (36.7 C)   Ht 5\' 1"  (1.549 m)   Wt 166 lb (75.3 kg)   SpO2 94%   BMI 31.37 kg/m  General: no acute distress, no respiratory distress Psych:  Alert and oriented, normal mood and affect HEENT:  Normocephalic, atraumatic, supple neck, moist mucous membranes, mildly erythematous pharynx without exudate, mild lymphadenopathy, supple neck  Cardiovascular:  RRR without murmur. no edema Respiratory:  Good breath sounds bilaterally, CTAB with normal respiratory effort without wheezing or rhonchi or rales skin:  Warm, no rashes Neurologic:   Mental status is normal. normal gait  Commons side effects, risks, benefits, and alternatives for medications and treatment plan prescribed today were discussed, and the patient expressed understanding of the given instructions. Patient is instructed to call or message via MyChart if he/she has any questions or concerns regarding our treatment plan. No barriers to understanding were identified. We discussed Red Flag symptoms and signs in detail. Patient expressed understanding regarding what to do in case of urgent or emergency type symptoms.  Medication list was reconciled, printed and provided to the patient in AVS. Patient instructions and summary information was reviewed with the patient as documented in the AVS. This note was prepared with assistance of Dragon voice recognition software. Occasional wrong-word or sound-a-like substitutions may have occurred due to the inherent limitations of voice recognition  software

## 2018-11-14 ENCOUNTER — Encounter: Payer: Self-pay | Admitting: Diagnostic Neuroimaging

## 2018-11-14 ENCOUNTER — Ambulatory Visit: Payer: Medicare Other | Admitting: Diagnostic Neuroimaging

## 2018-11-14 VITALS — BP 144/67 | HR 81 | Ht 61.0 in | Wt 166.4 lb

## 2018-11-14 DIAGNOSIS — G43109 Migraine with aura, not intractable, without status migrainosus: Secondary | ICD-10-CM | POA: Diagnosis not present

## 2018-11-14 DIAGNOSIS — M25611 Stiffness of right shoulder, not elsewhere classified: Secondary | ICD-10-CM | POA: Diagnosis not present

## 2018-11-14 DIAGNOSIS — R200 Anesthesia of skin: Secondary | ICD-10-CM | POA: Diagnosis not present

## 2018-11-14 MED ORDER — GABAPENTIN 300 MG PO CAPS: 300.0000 mg | ORAL_CAPSULE | Freq: Three times a day (TID) | ORAL | 4 refills | Status: DC

## 2018-11-14 NOTE — Patient Instructions (Signed)
  UPPER AND LOWER LIP NUMBNESS (may be related to new dentures) - continue gabapentin 300mg  three times a day; may increase up to 600mg  three times a day if needed

## 2018-11-14 NOTE — Progress Notes (Signed)
[facility]  PATIENT: Megan [name] DOB: 05-10-1946  REFERRING CLINICIAN: S Rehm HISTORY FROM: patient  REASON FOR VISIT: follow up    HISTORICAL  CHIEF COMPLAINT:  Chief Complaint  Patient presents with  . Migraine    rm 7, "lip numbness better on Gabapentin 3 x daily, occas doesn't work as well; no headaches at all"  . Follow-up    6 month    HISTORY OF PRESENT ILLNESS:   UPDATE (11/14/18, VRP): Since last visit, doing well. Symptoms (implant pain improved; numbness / burning is stable) are improved with gabapentin 300mg  three times a day. Severity is moderate. No alleviating or aggravating factors. Tolerating meds. No headaches.   UPDATE (05/09/18, VRP): Since last visit, doing well and migraine resolved on their own. No alleviating or aggravating factors.   Now with new onset of bilateral upper lip burning sensation and increased lower lip tingling since new dentures (upper and lower) since Jan 2019. Upper lip burning worse with patient has dentures in place, and slightly reduced when dentures taken out over night. Symptoms initially improving with denture adjustments, but now symptoms are slightly worse in last 2-3 weeks.   PRIOR HPI (08/10/17): 72 year old female here for evaluation of headaches.  Patient had onset of headaches around age 56 years old, sometimes preceded by seeing wavy lines of water. Headaches would be unilateral or bilateral, sometimes associated with unilateral hemibody numbness, sometimes a fascia, with headaches lasting up to one dated time. Patient had one or 2 headaches per year. After she had a hysterectomy in 1992 her headaches significant improved. Patient was never officially diagnosed with migraine headaches but suspected that she had migraines due to her sister's history of migraines and her paternal grandmother's history of migraines. Patient never tried prescription migraine medication.  06/15/2017 patient had cold sensation  and chills. She then had headache which continued until the next day. This reminded her of her previous headaches earlier in life. Headache recurred another day later and lasted for 5 weeks continuously. She had 2 episodes of visual aura. She then had headaches from August 20 until the 30th. She had another headache from September 2 of September 5. No nausea vomiting. No photophobia or phonophobia. She has had off-balance sensation. She has had left-sided headaches ranging from 3 out of 10 in severity. She has tried Midrin and Fioricet without relief. She is tried ibuprofen and Tylenol without relief.  Initially patient cannot identify any triggering factors. However 1 week prior to onset of headaches she had a tick bite. Also starting in May 2018 through July 2018, patient had significant dental and oral surgery procedures including 5 oral surgery implants as well as a bone spur removal. She has been left with residual post surgical numbness in her right lower lip and chin.  Also in the end of July 2018 patient had sudden onset of hearing loss in her right ear. No other associated symptoms. Patient went to ENT for evaluation. Patient was treated empirically with prednisone, acyclovir and doxycycline. Her hearing loss has significantly improved.   REVIEW OF SYSTEMS: Full 14 system review of systems performed and negative with exception of: as per HPI.  ALLERGIES: Allergies  Allergen Reactions  . Anticoagulant Compound Itching and Other (See Comments)    Hands and feet tingle and itch. Hands and feet tingle and itch.  . Morphine And Related Nausea Only and Itching  . Bextra [Valdecoxib] Other (See Comments)    Increased lft's Increased lft's  .  Hydrocod Polst-Cpm Polst Er Rash  . Lipitor [Atorvastatin] Other (See Comments)    Leg weakness Leg weakness  . Mevacor [Lovastatin] Other (See Comments)    Muscle weakness Muscle weakness  . Plavix  [Clopidogrel Bisulfate] Itching and Other (See  Comments)    Hands and feet tingle and itch.  . Zocor [Simvastatin] Other (See Comments)    Bones hurt Bones hurt   . Doxycycline Diarrhea    N/v/d  . Metoprolol Other (See Comments)    Hair loss  . Plavix [Clopidogrel Bisulfate] Other (See Comments)    Itching of hands and feet  . Codeine Itching and Rash    Hyper-active  . Hydrocod Polst-Cpm Polst Er Rash  . Lisinopril Cough    HOME MEDICATIONS: Outpatient Medications Prior to Visit  Medication Sig Dispense Refill  . albuterol (PROVENTIL HFA;VENTOLIN HFA) 108 (90 Base) MCG/ACT inhaler Inhale 2 puffs into the lungs every 4 (four) hours as needed for wheezing or shortness of breath. 1 Inhaler 2  . ALPRAZolam (XANAX) 0.25 MG tablet TAKE 1 TABLET BY MOUTH AT BEDTIME AS NEEDED 90 tablet 1  . amLODipine (NORVASC) 5 MG tablet TAKE 1 TABLET BY MOUTH EVERY DAY 90 tablet 1  . aspirin EC 81 MG tablet Take 1 tablet (81 mg total) by mouth daily. 90 tablet 3  . atorvastatin (LIPITOR) 80 MG tablet Take 1 tablet (80 mg total) by mouth at bedtime.    [name] augmented betamethasone dipropionate (DIPROLENE-AF) 0.05 % cream Apply 1 application topically daily as needed. For hands  1  . ciclopirox (LOPROX) 0.77 % cream Apply 1 application topically daily as needed (hands).     . clobetasol cream (TEMOVATE) 4.43 % Apply 1 application topically 2 (two) times daily as needed (hands).     . cycloSPORINE (RESTASIS) 0.05 % ophthalmic emulsion Place 1 drop into both eyes at bedtime.    [name] estradiol (ESTRACE) 0.5 MG tablet TAKE TWO TABLETS (1 MG TOTAL) BY MOUTH DAILY. 180 tablet 1  . gabapentin (NEURONTIN) 300 MG capsule Take 1 capsule (300 mg total) by mouth 3 (three) times daily. 90 capsule 5  . hydrALAZINE (APRESOLINE) 10 MG tablet Take 1 tablet (10 mg total) by mouth 3 (three) times daily. Hold if blood pressure remains low 270 tablet 2  . Ipratropium-Albuterol (COMBIVENT) 20-100 MCG/ACT AERS respimat Inhale 1 puff into the lungs every 6 (six) hours as needed  for wheezing or shortness of breath. 4 g 1  . losartan-hydrochlorothiazide (HYZAAR) 100-25 MG tablet Take 1 tablet by mouth daily. 90 tablet 3  . nitroGLYCERIN (NITROSTAT) 0.4 MG SL tablet Place 1 tablet (0.4 mg total) under the tongue every 5 (five) minutes as needed for chest pain. 25 tablet PRN  . omeprazole (PRILOSEC) 40 MG capsule TAKE 1 CAPSULE (40 MG TOTAL) BY MOUTH 2 (TWO) TIMES DAILY. 180 capsule 1  . potassium chloride (K-DUR) 10 MEQ tablet Take 2 tablets (20 mEq total) by mouth 2 (two) times daily. 360 tablet 3  . tretinoin (RETIN-A) 0.1 % cream Apply 1 application topically every other day.    . Vitamin D, Ergocalciferol, 2000 units CAPS Take 1 capsule by mouth daily.     . clindamycin (CLEOCIN T) 1 % lotion Apply 1 application topically daily as needed (apply to face).     [name] guaiFENesin-codeine 100-10 MG/5ML syrup Take 5 mLs by mouth every 6 (six) hours as needed for cough. 120 mL 0  . oxyCODONE (ROXICODONE) 5 MG immediate release tablet Take 1-2 tablets (  5-10 mg total) by mouth every 4 (four) hours as needed. 40 tablet 0  . predniSONE (DELTASONE) 10 MG tablet Take 4 tabs qd x 2 days, 3 qd x 2 days, 2 qd x 2d, 1qd x 3 days 21 tablet 0   No facility-administered medications prior to visit.     PAST MEDICAL HISTORY: Past Medical History:  Diagnosis Date  . AAA (abdominal aortic aneurysm) ([place])    a. s/p stent grafting in 2009.  . Adenomatous colon polyp 05/2001  . Adenomatous duodenal polyp   . Allergy   . Anginal pain ([place])   . COPD (chronic obstructive pulmonary disease) ([place])   . Coronary artery disease    a. s/p CABG in 1995;  b. 05/2013 Neg MV, EF 82%;  c. 06/2013 Cath: LM nl, LAD 40-50p, LCX nl, RCA 80p/14m, VG->RCA->PDA->PLSA min irregs, LIMA->LAD atretic, vigorous LV fxn.  . Diabetes mellitus without complication (HCC)    history of, resolved. 2017  . Diverticulosis   . Eczema, dyshidrotic   . Eczema, dyshidrotic 1986   Dr. Mathis Fare  . Fibromyalgia   . GERD  (gastroesophageal reflux disease)   . Hyperlipidemia   . Hypertension   . Migraine   . Myocardial infarction ([place])   . Osteoarthritis   . Peripheral arterial disease (HCC)    left leg diagnosed by Dr Mare Ferrari  . Renal artery stenosis ([place])    a. 2008 s/p PTA    PAST SURGICAL HISTORY: Past Surgical History:  Procedure Laterality Date  . ABDOMINAL AORTAGRAM N/A 03/06/2014   Procedure: ABDOMINAL AORTAGRAM;  Surgeon: Rosetta Posner, MD;  Location: [facility] CATH LAB;  Service: Cardiovascular;  Laterality: N/A;  . ABDOMINAL AORTIC ANEURYSM REPAIR  2009   stenting  . ABDOMINAL HYSTERECTOMY     Total, 1992  . BREAST BIOPSY    . CARDIAC CATHETERIZATION    . CARDIOVASCULAR STRESS TEST  11/11/2009   normal study  . CARPAL TUNNEL RELEASE  08/1993   left wrist  . CARPAL TUNNEL RELEASE  01/1997   right  . CORONARY ARTERY BYPASS GRAFT  07/1994   Triple by Dr Ceasar Mons  . dental implants    . HAND SURGERY Right 09/2002   Right hand pulley release  . LEFT HEART CATHETERIZATION WITH CORONARY/GRAFT ANGIOGRAM N/A 06/11/2013   Procedure: LEFT HEART CATHETERIZATION WITH [name];  Surgeon: Thayer Headings, MD;  Location: [facility] CATH LAB;  Service: Cardiovascular;  Laterality: N/A;  . MULTIPLE TOOTH EXTRACTIONS  2000   All upper teeth.  Has full dneture.   [name] RENAL ARTERY STENT  2008  . SHOULDER ARTHROSCOPY WITH ROTATOR CUFF REPAIR Right 09/27/2018   Procedure: Right shoulder mini open rotator cuff repair;  Surgeon: Susa Day, MD;  Location: [facility] ORS;  Service: Orthopedics;  Laterality: Right;  90 mins  . thyroid cyst apiration  05/1997 and 07/1998  . TONSILECTOMY, ADENOIDECTOMY, BILATERAL MYRINGOTOMY AND TUBES  1989  . TOTAL ABDOMINAL HYSTERECTOMY  1992    FAMILY HISTORY: Family History  Problem Relation Age of Onset  . Heart disease Mother   . Heart attack Mother   . Heart disease Father        before age 4  . Heart attack Father   . Heart disease Sister        before age 32  .  Diabetes Sister   . Hyperlipidemia Sister   . Hypertension Sister   . Heart attack Sister   . Liver cancer Sister   .  Breast cancer Sister   . Heart disease Brother   . Diabetes Brother   . Hyperlipidemia Brother   . Hypertension Brother   . Heart attack Brother   . AAA (abdominal aortic aneurysm) Brother   . Colon cancer Paternal Grandmother   . Heart disease Brother   . Pancreatic cancer Neg Hx   . Rectal cancer Neg Hx   . Stomach cancer Neg Hx   . Esophageal cancer Neg Hx     SOCIAL HISTORY:  Social History   Socioeconomic History  . Marital status: Married    Spouse name: Not on file  . Number of children: Not on file  . Years of education: Not on file  . Highest education level: Not on file  Occupational History  . Not on file  Social Needs  . Financial resource strain: Not on file  . Food insecurity:    Worry: Not on file    Inability: Not on file  . Transportation needs:    Medical: Not on file    Non-medical: Not on file  Tobacco Use  . Smoking status: Former Smoker    Packs/day: 0.00    Years: 37.00    Pack years: 0.00    Types: Cigarettes    Last attempt to quit: 12/07/1995    Years since quitting: 22.9  . Smokeless tobacco: Never Used  Substance and Sexual Activity  . Alcohol use: Yes    Alcohol/week: 6.0 - 10.0 standard drinks    Types: 6 - 10 Glasses of wine per week    Comment: 2-3 glasses daily  . Drug use: No  . Sexual activity: Not Currently    Partners: Male  Lifestyle  . Physical activity:    Days per week: Not on file    Minutes per session: Not on file  . Stress: Not on file  Relationships  . Social connections:    Talks on phone: Not on file    Gets together: Not on file    Attends religious service: Not on file    Active member of club or organization: Not on file    Attends meetings of clubs or organizations: Not on file    Relationship status: Not on file  . Intimate partner violence:    Fear of current or ex partner: Not  on file    Emotionally abused: Not on file    Physically abused: Not on file    Forced sexual activity: Not on file  Other Topics Concern  . Not on file  Social History Narrative   Lives at home with husband.  Is retired.  Education 4 yrs of college.  No children.   Caffiene 2 cups daily.      PHYSICAL EXAM  GENERAL EXAM/CONSTITUTIONAL: Vitals:  Vitals:   11/14/18 1010  BP: (!) 144/67  Pulse: 81  Weight: 166 lb 6.4 oz (75.5 kg)  Height: 5\' 1"  (1.549 m)   Body mass index is 31.44 kg/m. No exam data present  Patient is in no distress; well developed, nourished and groomed; neck is supple  CARDIOVASCULAR:  Examination of carotid arteries is normal; no carotid bruits  Regular rate and rhythm, no murmurs  Examination of peripheral vascular system by observation and palpation is normal  EYES:  Ophthalmoscopic exam of optic discs and posterior segments is normal; no papilledema or hemorrhages  MUSCULOSKELETAL:  Gait, strength, tone, movements noted in Neurologic exam below  NEUROLOGIC: MENTAL STATUS:  No flowsheet data found.  awake, alert,  oriented to person, place and time  recent and remote memory intact  normal attention and concentration  language fluent, comprehension intact, naming intact,   fund of knowledge appropriate  CRANIAL NERVE:   2nd - no papilledema on fundoscopic exam  2nd, 3rd, 4th, 6th - pupils equal and reactive to light, visual fields full to confrontation, extraocular muscles intact, no nystagmus  5th - facial sensation symmetric  7th - facial strength symmetric  8th - hearing intact  9th - palate elevates symmetrically, uvula midline  11th - shoulder shrug symmetric  12th - tongue protrusion midline  MOTOR:   normal bulk and tone, full strength in the BUE, BLE  SENSORY:   normal and symmetric to light touch, temperature, vibration  COORDINATION:   finger-nose-finger, fine finger movements normal  REFLEXES:    deep tendon reflexes TRACE and symmetric  GAIT/STATION:   narrow based gait    DIAGNOSTIC DATA (LABS, IMAGING, TESTING) - I reviewed patient records, labs, notes, testing and imaging myself where available.  Lab Results  Component Value Date   WBC 15.9 (H) 09/28/2018   HGB 14.2 09/28/2018   HCT 43.5 09/28/2018   MCV 91.4 09/28/2018   PLT 197 09/28/2018      Component Value Date/Time   NA 139 09/28/2018 0500   NA 145 (H) 06/13/2018 0813   K 3.7 09/28/2018 0500   CL 103 09/28/2018 0500   CO2 25 09/28/2018 0500   GLUCOSE 134 (H) 09/28/2018 0500   BUN 12 09/28/2018 0500   BUN 16 06/13/2018 0813   CREATININE 0.74 09/28/2018 0500   CREATININE 0.71 06/29/2016 1057   CALCIUM 9.6 09/28/2018 0500   PROT 5.8 (L) 09/14/2018 0811   ALBUMIN 4.1 09/14/2018 0811   AST 38 09/14/2018 0811   ALT 45 (H) 09/14/2018 0811   ALKPHOS 73 09/14/2018 0811   BILITOT 0.5 09/14/2018 0811   GFRNONAA >60 09/28/2018 0500   GFRAA >60 09/28/2018 0500   Lab Results  Component Value Date   CHOL 150 09/14/2018   HDL 52 09/14/2018   LDLCALC 66 09/14/2018   LDLDIRECT 87.4 12/09/2014   TRIG 160 (H) 09/14/2018   CHOLHDL 2.9 09/14/2018   Lab Results  Component Value Date   HGBA1C 5.8 (H) 09/21/2018   No results found for: VITAMINB12 Lab Results  Component Value Date   TSH 2.950 11/16/2014    07/03/17 MRI brain and IAC [I reviewed images myself and agree with interpretation. -VRP]  - Unremarkable cranial MRI. No retrocochlear lesion is identified. No abnormality is seen which might contribute to headache. - Mild cerebral and cerebellar atrophy.  - Mild subcortical and periventricular T2 and FLAIR hyperintensities, likely chronic microvascular ischemic change. - Tiny focus chronic hemorrhage in the LEFT frontal subcortical white matter, likely sequelae of hypertension.    ASSESSMENT AND PLAN  72 y.o. year old female here with history of headaches since age 10 years old, likely migraine  with aura. Now with recurrence of headaches in July 2018, one week after a tick bite and 1-2 months following significant oral and dental surgeries. Also with sudden onset hearing loss, possibly viral or inflammatory, which has improved.  Regarding patient's headaches these are likely migraine with aura. These may have been aggravated by the above factors. Now resolved.   Patient also having some generalized balance difficulty, which may be related to her hearing loss issue. Did well with PT.   MRI of the brain was reviewed. She has some chronic changes related to hypertension  but no acute findings.  Now with new upper lip burning and increased lower lip tingling since new dentures in Jan 2019, worsening last few weeks.    Dx:  1. Lip numbness   2. Migraine with aura and without status migrainosus, not intractable      PLAN:  UPPER AND LOWER LIP NUMBNESS (may be related to new dentures) - continue gabapentin 300mg  three times a day; may increase up to 600mg  three times a day if needed  MIGRAINE WITH AURA (resolved) - use tylenol / ibuprofen as needed for breakthrough headache  GAIT DIFFICULTY - continue PT exercises  Meds ordered this encounter  Medications  . gabapentin (NEURONTIN) 300 MG capsule    Sig: Take 1-2 capsules (300-600 mg total) by mouth 3 (three) times daily.    Dispense:  540 capsule    Refill:  4    Please cancel previous prescription refills.   Return in about 1 year (around 11/15/2019), or if symptoms worsen or fail to improve.    Penni Bombard, MD 10/05/2810, 88:67 AM Certified in Neurology, Neurophysiology and Neuroimaging  [facility] [address], [place], [place] [address] [phone]

## 2018-11-15 ENCOUNTER — Ambulatory Visit: Payer: Self-pay | Admitting: *Deleted

## 2018-11-15 NOTE — Telephone Encounter (Signed)
Message from Vernona Rieger sent at 11/15/2018 3:31 PM EST   Patient said she saw Dr Jonni Sanger on Monday, December 9th and that her bad cough has came back and would like something to be called in for this. She states that she has filled the prednisone, and picked it up today. Please contact patient.   [facility] [address]    Returned call to patient regarding cough medication. Pt stated that just started the prednisone and now has a non productive cough or coughing up very little.. She denies fever and she has 2 inhalers if she feels short of breath. Will route this to her pcp for recommendation for cough medication.

## 2018-11-16 DIAGNOSIS — M25611 Stiffness of right shoulder, not elsewhere classified: Secondary | ICD-10-CM | POA: Diagnosis not present

## 2018-11-16 MED ORDER — HYDROCODONE-HOMATROPINE 5-1.5 MG/5ML PO SYRP: 5.0000 mL | ORAL_SOLUTION | Freq: Three times a day (TID) | ORAL | 0 refills | Status: DC | PRN

## 2018-11-16 NOTE — Telephone Encounter (Signed)
I can order tessalon OR hydrocodone cough syrup which is stronger. She has allergy to hydrocodone listed but has taken it in the past.  Please clarify if she can tolerate cough syrup, if so, I'll order it.  If not, please order tessalon perles 100mg  tid prn #30. No refill.

## 2018-11-16 NOTE — Telephone Encounter (Signed)
LM with husband for patient to call me so that I can verify which medication patient wants.

## 2018-11-16 NOTE — Addendum Note (Signed)
Addended by: Billey Chang on: 11/16/2018 03:41 PM   Modules accepted: Orders

## 2018-11-16 NOTE — Telephone Encounter (Signed)
Pt states that she would like the hydrocodone cough syrup.

## 2018-11-20 ENCOUNTER — Telehealth: Payer: Self-pay | Admitting: Family Medicine

## 2018-11-20 MED ORDER — HYDROCODONE-HOMATROPINE 5-1.5 MG/5ML PO SYRP: 5.0000 mL | ORAL_SOLUTION | Freq: Three times a day (TID) | ORAL | 0 refills | Status: DC | PRN

## 2018-11-20 NOTE — Telephone Encounter (Signed)
Copied from [place] [id number]. Topic: Quick Communication - See Telephone Encounter >> Nov 20, 2018  8:55 AM Blase Mess A wrote: CRM for notification. See Telephone encounter for: 11/20/18.  Patient checked with the [facility] and her HYDROcodone-homatropine (HYCODAN) 5-1.5 MG/5ML syrup [[id number]] was not received on 11/16/18. Can the script be resent?

## 2018-11-20 NOTE — Telephone Encounter (Signed)
Rx in computer as sent to pharmacy (as phone in) 11/16/18- verified not at pharmacy- please resend for patient.

## 2018-11-20 NOTE — Telephone Encounter (Signed)
Sent RX into pharmacy.   

## 2018-11-21 DIAGNOSIS — M25611 Stiffness of right shoulder, not elsewhere classified: Secondary | ICD-10-CM | POA: Diagnosis not present

## 2018-11-23 DIAGNOSIS — M25611 Stiffness of right shoulder, not elsewhere classified: Secondary | ICD-10-CM | POA: Diagnosis not present

## 2018-11-27 DIAGNOSIS — H1013 Acute atopic conjunctivitis, bilateral: Secondary | ICD-10-CM | POA: Diagnosis not present

## 2018-11-27 DIAGNOSIS — M25611 Stiffness of right shoulder, not elsewhere classified: Secondary | ICD-10-CM | POA: Diagnosis not present

## 2018-12-01 DIAGNOSIS — M25611 Stiffness of right shoulder, not elsewhere classified: Secondary | ICD-10-CM | POA: Diagnosis not present

## 2018-12-05 ENCOUNTER — Telehealth: Payer: Self-pay | Admitting: Cardiology

## 2018-12-05 DIAGNOSIS — M25611 Stiffness of right shoulder, not elsewhere classified: Secondary | ICD-10-CM | POA: Diagnosis not present

## 2018-12-05 NOTE — Telephone Encounter (Signed)
New Message:   Patient would like for some to call concerning a order for blood work. Please call patient.

## 2018-12-05 NOTE — Telephone Encounter (Signed)
Returned call to patient advised she had lab work done 10/19.Advised she will not need lab done before her appointment with Dr.Jordan 12/18/18 at 11:00 am.

## 2018-12-07 DIAGNOSIS — M25611 Stiffness of right shoulder, not elsewhere classified: Secondary | ICD-10-CM | POA: Diagnosis not present

## 2018-12-12 DIAGNOSIS — M25611 Stiffness of right shoulder, not elsewhere classified: Secondary | ICD-10-CM | POA: Diagnosis not present

## 2018-12-13 ENCOUNTER — Other Ambulatory Visit: Payer: Self-pay | Admitting: Cardiology

## 2018-12-14 DIAGNOSIS — M25611 Stiffness of right shoulder, not elsewhere classified: Secondary | ICD-10-CM | POA: Diagnosis not present

## 2018-12-16 NOTE — Progress Notes (Signed)
Cardiology Office Note   Date:  12/18/2018   ID:  Megan [name], DOB 1946/09/24, MRN [id number]  PCP:  [name], MD  Cardiologist: Xiamara Hulet Martinique MD  Chief Complaint  Patient presents with  . Follow-up    swelling in legs and calfs, needs rx refills       History of Present Illness: Megan [name] is a 73 y.o. female who is seen for follow up of CAD, HTN, HL, and PAD.   She has a past history of essential hypertension and  coronary artery disease. Also has a history of dyslipidemia. She is status post coronary artery bypass graft surgery in 1995. She had a renal artery stent for renal artery stenosis in 2008. She had a stent graft of an abdominal aortic aneurysm 2009. She is s/p stenting of the left iliac. She is followed by [name] for PAD. Last CT in May 2018. She had [place] in June 2019 showing no endoleak.    She was hospitalized in July 2014 and had cardiac catheterization which showed that her grafts were open. EF normal.  She was hospitalized 11/15/2014 with which he describes as an aching sensation in her chest.  She underwent a treadmill Myoview on 11/17/14 which showed no ischemia and her ejection fraction was 86%.  In June 2019 she had Abdominal US at [facility] showing AAA of 4 cm.   In October 2019 she underwent right rotator cuff repair.  On follow up today she notes she has increased swelling in her feet and ankles for the past month. Weight is up 3 lbs. She has been going to PT for her shoulder but isn't doing much walking. Has plantar fasciitis in her left heel. Denies any chest pain or SOB. Did have some issues with her COPD during surgery and was DC on some inhalers.   Past Medical History:  Diagnosis Date  . AAA (abdominal aortic aneurysm) ([place])    a. s/p stent grafting in 2009.  . Adenomatous colon polyp 05/2001  . Adenomatous duodenal polyp   . Allergy   . Anginal pain ([place])   . COPD (chronic obstructive pulmonary disease) ([place])   . Coronary artery  disease    a. s/p CABG in 1995;  b. 05/2013 Neg MV, EF 82%;  c. 06/2013 Cath: LM nl, LAD 40-50p, LCX nl, RCA 80p/128m, VG->RCA->PDA->PLSA min irregs, LIMA->LAD atretic, vigorous LV fxn.  . Diabetes mellitus without complication (HCC)    history of, resolved. 2017  . Diverticulosis   . Eczema, dyshidrotic   . Eczema, dyshidrotic 1986   Dr. Mathis Fare  . Fibromyalgia   . GERD (gastroesophageal reflux disease)   . Hyperlipidemia   . Hypertension   . Migraine   . Myocardial infarction ([place])   . Osteoarthritis   . Peripheral arterial disease (HCC)    left leg diagnosed by Dr Mare Ferrari  . Renal artery stenosis ([place])    a. 2008 s/p PTA    Past Surgical History:  Procedure Laterality Date  . ABDOMINAL AORTAGRAM N/A 03/06/2014   Procedure: ABDOMINAL AORTAGRAM;  Surgeon: Rosetta Posner, MD;  Location: [facility] CATH LAB;  Service: Cardiovascular;  Laterality: N/A;  . ABDOMINAL AORTIC ANEURYSM REPAIR  2009   stenting  . ABDOMINAL HYSTERECTOMY     Total, 1992  . BREAST BIOPSY    . CARDIAC CATHETERIZATION    . CARDIOVASCULAR STRESS TEST  11/11/2009   normal study  . CARPAL TUNNEL RELEASE  08/1993   left  wrist  . CARPAL TUNNEL RELEASE  01/1997   right  . CORONARY ARTERY BYPASS GRAFT  07/1994   Triple by Dr Ceasar Mons  . dental implants    . HAND SURGERY Right 09/2002   Right hand pulley release  . LEFT HEART CATHETERIZATION WITH CORONARY/GRAFT ANGIOGRAM N/A 06/11/2013   Procedure: LEFT HEART CATHETERIZATION WITH [name];  Surgeon: Thayer Headings, MD;  Location: [facility] CATH LAB;  Service: Cardiovascular;  Laterality: N/A;  . MULTIPLE TOOTH EXTRACTIONS  2000   All upper teeth.  Has full dneture.   [name] RENAL ARTERY STENT  2008  . SHOULDER ARTHROSCOPY WITH ROTATOR CUFF REPAIR Right 09/27/2018   Procedure: Right shoulder mini open rotator cuff repair;  Surgeon: Susa Day, MD;  Location: [facility] ORS;  Service: Orthopedics;  Laterality: Right;  90 mins  . thyroid cyst apiration  05/1997 and  07/1998  . TONSILECTOMY, ADENOIDECTOMY, BILATERAL MYRINGOTOMY AND TUBES  1989  . TOTAL ABDOMINAL HYSTERECTOMY  1992     Current Outpatient Medications  Medication Sig Dispense Refill  . albuterol (PROVENTIL HFA;VENTOLIN HFA) 108 (90 Base) MCG/ACT inhaler Inhale 2 puffs into the lungs every 4 (four) hours as needed for wheezing or shortness of breath. 1 Inhaler 2  . ALPRAZolam (XANAX) 0.25 MG tablet TAKE 1 TABLET BY MOUTH AT BEDTIME AS NEEDED 90 tablet 1  . amLODipine (NORVASC) 5 MG tablet Take 1 tablet (5 mg total) by mouth daily. 90 tablet 3  . aspirin 325 MG tablet Take 325 mg by mouth daily.    [name] atorvastatin (LIPITOR) 80 MG tablet Take 1 tablet (80 mg total) by mouth at bedtime.    [name] augmented betamethasone dipropionate (DIPROLENE-AF) 0.05 % cream Apply 1 application topically daily as needed. For hands  1  . ciclopirox (LOPROX) 0.77 % cream Apply 1 application topically daily as needed (hands).     . clobetasol cream (TEMOVATE) 0.35 % Apply 1 application topically 2 (two) times daily as needed (hands).     . cycloSPORINE (RESTASIS) 0.05 % ophthalmic emulsion Place 1 drop into both eyes at bedtime.    [name] estradiol (ESTRACE) 0.5 MG tablet TAKE TWO TABLETS (1 MG TOTAL) BY MOUTH DAILY. 180 tablet 1  . gabapentin (NEURONTIN) 300 MG capsule Take 1-2 capsules (300-600 mg total) by mouth 3 (three) times daily. 540 capsule 4  . hydrALAZINE (APRESOLINE) 10 MG tablet Take 1 tablet (10 mg total) by mouth 3 (three) times daily. Hold if blood pressure remains low 270 tablet 2  . HYDROcodone-homatropine (HYCODAN) 5-1.5 MG/5ML syrup Take 5 mLs by mouth every 8 (eight) hours as needed for cough. 120 mL 0  . Ipratropium-Albuterol (COMBIVENT) 20-100 MCG/ACT AERS respimat Inhale 1 puff into the lungs every 6 (six) hours as needed for wheezing or shortness of breath. 4 g 1  . losartan-hydrochlorothiazide (HYZAAR) 100-25 MG tablet Take 1 tablet by mouth daily. 90 tablet 3  . nitroGLYCERIN (NITROSTAT) 0.4 MG SL  tablet Place 1 tablet (0.4 mg total) under the tongue every 5 (five) minutes as needed for chest pain. 25 tablet PRN  . omeprazole (PRILOSEC) 40 MG capsule TAKE 1 CAPSULE (40 MG TOTAL) BY MOUTH 2 (TWO) TIMES DAILY. 180 capsule 1  . potassium chloride (K-DUR) 10 MEQ tablet Take 2 tablets (20 mEq total) by mouth 2 (two) times daily. 360 tablet 3  . tretinoin (RETIN-A) 0.1 % cream Apply 1 application topically every other day.    . Vitamin D, Ergocalciferol, 2000 units CAPS Take 1 capsule  by mouth daily.     [name] torsemide (DEMADEX) 20 MG tablet Take 1 tablet (20 mg total) by mouth daily as needed. 30 tablet 6   No current facility-administered medications for this visit.     Allergies:   Anticoagulant compound; Morphine and related; Bextra [valdecoxib]; Hydrocod polst-cpm polst er; Lipitor [atorvastatin]; Mevacor [lovastatin]; Plavix  [clopidogrel bisulfate]; Zocor [simvastatin]; Doxycycline; Metoprolol; Plavix [clopidogrel bisulfate]; Codeine; Hydrocod polst-cpm polst er; and Lisinopril    Social History:  The patient  reports that she quit smoking about 23 years ago. Her smoking use included cigarettes. She smoked 0.00 packs per day for 37.00 years. She has never used smokeless tobacco. She reports current alcohol use of about 6.0 - 10.0 standard drinks of alcohol per week. She reports that she does not use drugs.   Family History:  The patient's family history includes AAA (abdominal aortic aneurysm) in her brother; Breast cancer in her sister; Colon cancer in her paternal grandmother; Diabetes in her brother and sister; Heart attack in her brother, father, mother, and sister; Heart disease in her brother, brother, father, mother, and sister; Hyperlipidemia in her brother and sister; Hypertension in her brother and sister; Liver cancer in her sister.    ROS:  Please see the history of present illness.   Otherwise, review of systems are positive for none.   All other systems are reviewed and negative.     PHYSICAL EXAM: VS:  BP 128/60   Pulse 73   Ht 5' 0.5" (1.537 m)   Wt 169 lb (76.7 kg)   BMI 32.46 kg/m  , BMI Body mass index is 32.46 kg/m. GENERAL:  Overweight  WF in NAD HEENT:  PERRL, EOMI, sclera are clear. Oropharynx is clear. NECK:  No jugular venous distention, carotid upstroke brisk and symmetric, no bruits, no thyromegaly or adenopathy LUNGS:  Clear to auscultation bilaterally CHEST:  Unremarkable HEART:  RRR,  PMI not displaced or sustained,S1 and S2 within normal limits, no S3, no S4: no clicks, no rubs, no murmurs ABD:  Soft, nontender. BS +, no masses or bruits. No hepatomegaly, no splenomegaly EXT:  2 + pulses throughout, 1+ edema, no cyanosis no clubbing SKIN:  Warm and dry.  No rashes NEURO:  Alert and oriented x 3. Cranial nerves II through XII intact. PSYCH:  Cognitively intact     EKG:  EKG is  done today. NSR with rate 73. Normal Ecg. I have personally reviewed and interpreted this study.     Recent Labs: 09/14/2018: ALT 45 09/28/2018: BUN 12; Creatinine, Ser 0.74; Hemoglobin 14.2; Platelets 197; Potassium 3.7; Sodium 139   Labs from primary care 12/15/16: A1c 5.6%. : Cholesterol 167 Trig-209, HDL 52, LDL 73.  AST 51, ALT 65. Normal renal indices. TSH is normal. Dated 06/24/17: CRP <0.3. Glucose 120, AST 58, ALT 80, other chemistries normal.  Dated 09/28/18: normal CBC, CMET  Lipid Panel    Component Value Date/Time   CHOL 150 09/14/2018 0811   TRIG 160 (H) 09/14/2018 0811   HDL 52 09/14/2018 0811   CHOLHDL 2.9 09/14/2018 0811   CHOLHDL 3 01/13/2018 1031   VLDL 38.4 01/13/2018 1031   LDLCALC 66 09/14/2018 0811   LDLDIRECT 87.4 12/09/2014 0808      Wt Readings from Last 3 Encounters:  12/18/18 169 lb (76.7 kg)  11/14/18 166 lb 6.4 oz (75.5 kg)  11/13/18 166 lb (75.3 kg)      ASSESSMENT AND PLAN:  1.CAD status post CABG in 1995. Most recent  cardiac cath in July 2014 showed no recurrent stenoses and patent grafts. Most recent  treadmill Myoview 11/17/14 normal with ejection fraction 86%. She is asymptomatic. She notes that prior stress tests really haven't been very helpful. Will just monitor symptoms for now.  2. HTN with hypertensive heart disease. BP is under good control. 3. Hypercholesterolemia- LDL 66. Goal <70. Continue  lipitor  80 mg daily. Recheck 6 months. 4. Abnormal liver function studies with mild transaminase elevation.   5. Status post stent to right renal artery 2008. Patent by CT in May 2018.  6. Status post stent graft to abdominal aortic aneurysm 2009.  S/p iliac stent in 2016. Stable by [place] in May 7. Peripheral artery disease followed by [name] 8. Ankle edema. Will give torsemide 20 mg to take daily prn swelling.   Signed, Brandyn Thien [place]ique MD, [facility]    12/18/2018 11:26 AM    [place]

## 2018-12-18 ENCOUNTER — Encounter: Payer: Self-pay | Admitting: Cardiology

## 2018-12-18 ENCOUNTER — Ambulatory Visit: Payer: Medicare Other | Admitting: Cardiology

## 2018-12-18 VITALS — BP 128/60 | HR 73 | Ht 60.5 in | Wt 169.0 lb

## 2018-12-18 DIAGNOSIS — E782 Mixed hyperlipidemia: Secondary | ICD-10-CM

## 2018-12-18 DIAGNOSIS — I25708 Atherosclerosis of coronary artery bypass graft(s), unspecified, with other forms of angina pectoris: Secondary | ICD-10-CM

## 2018-12-18 DIAGNOSIS — I714 Abdominal aortic aneurysm, without rupture, unspecified: Secondary | ICD-10-CM

## 2018-12-18 DIAGNOSIS — I119 Hypertensive heart disease without heart failure: Secondary | ICD-10-CM

## 2018-12-18 DIAGNOSIS — I739 Peripheral vascular disease, unspecified: Secondary | ICD-10-CM | POA: Diagnosis not present

## 2018-12-18 DIAGNOSIS — I25810 Atherosclerosis of coronary artery bypass graft(s) without angina pectoris: Secondary | ICD-10-CM

## 2018-12-18 MED ORDER — LOSARTAN POTASSIUM-HCTZ 100-25 MG PO TABS: 1.0000 | ORAL_TABLET | Freq: Every day | ORAL | 3 refills | Status: DC

## 2018-12-18 MED ORDER — NITROGLYCERIN 0.4 MG SL SUBL: 0.4000 mg | SUBLINGUAL_TABLET | SUBLINGUAL | 99 refills | Status: DC | PRN

## 2018-12-18 MED ORDER — TORSEMIDE 20 MG PO TABS: 20.0000 mg | ORAL_TABLET | Freq: Every day | ORAL | 6 refills | Status: DC | PRN

## 2018-12-18 MED ORDER — AMLODIPINE BESYLATE 5 MG PO TABS: 5.0000 mg | ORAL_TABLET | Freq: Every day | ORAL | 3 refills | Status: DC

## 2018-12-19 DIAGNOSIS — M25611 Stiffness of right shoulder, not elsewhere classified: Secondary | ICD-10-CM | POA: Diagnosis not present

## 2018-12-21 DIAGNOSIS — M25611 Stiffness of right shoulder, not elsewhere classified: Secondary | ICD-10-CM | POA: Diagnosis not present

## 2018-12-27 DIAGNOSIS — M25611 Stiffness of right shoulder, not elsewhere classified: Secondary | ICD-10-CM | POA: Diagnosis not present

## 2019-01-02 ENCOUNTER — Other Ambulatory Visit: Payer: Self-pay | Admitting: Cardiology

## 2019-01-02 DIAGNOSIS — M25611 Stiffness of right shoulder, not elsewhere classified: Secondary | ICD-10-CM | POA: Diagnosis not present

## 2019-01-04 DIAGNOSIS — M25611 Stiffness of right shoulder, not elsewhere classified: Secondary | ICD-10-CM | POA: Diagnosis not present

## 2019-01-09 DIAGNOSIS — M25611 Stiffness of right shoulder, not elsewhere classified: Secondary | ICD-10-CM | POA: Diagnosis not present

## 2019-01-10 DIAGNOSIS — L308 Other specified dermatitis: Secondary | ICD-10-CM | POA: Diagnosis not present

## 2019-01-11 DIAGNOSIS — M25611 Stiffness of right shoulder, not elsewhere classified: Secondary | ICD-10-CM | POA: Diagnosis not present

## 2019-01-16 DIAGNOSIS — M25611 Stiffness of right shoulder, not elsewhere classified: Secondary | ICD-10-CM | POA: Diagnosis not present

## 2019-01-18 DIAGNOSIS — M25611 Stiffness of right shoulder, not elsewhere classified: Secondary | ICD-10-CM | POA: Diagnosis not present

## 2019-01-23 DIAGNOSIS — M25611 Stiffness of right shoulder, not elsewhere classified: Secondary | ICD-10-CM | POA: Diagnosis not present

## 2019-01-24 NOTE — Progress Notes (Signed)
Subjective:   Megan [name] is a 73 y.o. female who presents for Medicare Annual (Subsequent) preventive examination.  Review of Systems:  No ROS.  Medicare Wellness Visit. Additional risk factors are reflected in the social history.  Cardiac Risk Factors include: advanced age (>7men, >52 women);dyslipidemia;hypertension;family history of premature cardiovascular disease;sedentary lifestyle   Sleep patterns: Sleeps 7-8 hours.  Home Safety/Smoke Alarms: Feels safe in home. Smoke alarms in place.  Living environment; residence and Firearm Safety: Lives with husband, [name], in 4 story home.  Seat Belt Safety/Bike Helmet: Wears seat belt.   Female:   Pap-NA      Mammo-08/29/2018, BI-RADS CATEGORY  2: Benign.       Dexa scan-03/07/2003. Declines testing.         CCS-Colonoscopy 09/19/2014, polyp. Recall 5 years.      Objective:     Vitals: BP 130/68 (BP Location: Left Arm, Patient Position: Sitting, Cuff Size: Normal)   Pulse 68   Temp 98.2 F (36.8 C) (Temporal)   Resp 16   Ht 5\' 1"  (1.549 m)   Wt 168 lb 6 oz (76.4 kg)   SpO2 97%   BMI 31.81 kg/m   Body mass index is 31.81 kg/m.  Advanced Directives 01/25/2019 09/27/2018 09/21/2018 01/12/2018 08/24/2017 05/24/2017 03/30/2016  Does Patient Have a Medical Advance Directive? Yes Yes Yes Yes Yes Yes Yes  Type of Paramedic of [place];Living will [place];Living will [place];Living will Living will;Healthcare Power of [place];Living will [place];Living will [place];Living will  Does patient want to make changes to medical advance directive? - No - Patient declined - - - - -  Copy of [place] in Chart? Yes - validated most recent copy scanned in chart (See row information) Yes Yes No - copy requested - - No - copy requested  Pre-existing out of facility DNR order (yellow form or  pink MOST form) - - - - - - -    Tobacco Social History   Tobacco Use  Smoking Status Former Smoker  . Packs/day: 0.00  . Years: 37.00  . Pack years: 0.00  . Types: Cigarettes  . Last attempt to quit: 12/07/1995  . Years since quitting: 23.1  Smokeless Tobacco Never Used     Counseling given: Not Answered   Past Medical History:  Diagnosis Date  . AAA (abdominal aortic aneurysm) ([place])    a. s/p stent grafting in 2009.  . Adenomatous colon polyp 05/2001  . Adenomatous duodenal polyp   . Allergy   . Anginal pain ([place])   . COPD (chronic obstructive pulmonary disease) ([place])   . Coronary artery disease    a. s/p CABG in 1995;  b. 05/2013 Neg MV, EF 82%;  c. 06/2013 Cath: LM nl, LAD 40-50p, LCX nl, RCA 80p/153m, VG->RCA->PDA->PLSA min irregs, LIMA->LAD atretic, vigorous LV fxn.  . Diabetes mellitus without complication (HCC)    history of, resolved. 2017  . Diverticulosis   . Eczema, dyshidrotic   . Eczema, dyshidrotic 1986   Dr. Mathis Fare  . Fibromyalgia   . GERD (gastroesophageal reflux disease)   . Hyperlipidemia   . Hypertension   . Migraine   . Myocardial infarction ([place])   . Osteoarthritis   . Peripheral arterial disease (HCC)    left leg diagnosed by Dr Mare Ferrari  . Renal artery stenosis ([place])    a. 2008 s/p PTA  Past Surgical History:  Procedure Laterality Date  . ABDOMINAL AORTAGRAM N/A 03/06/2014   Procedure: ABDOMINAL AORTAGRAM;  Surgeon: Rosetta Posner, MD;  Location: [facility] CATH LAB;  Service: Cardiovascular;  Laterality: N/A;  . ABDOMINAL AORTIC ANEURYSM REPAIR  2009   stenting  . ABDOMINAL HYSTERECTOMY     Total, 1992  . BREAST BIOPSY    . CARDIAC CATHETERIZATION    . CARDIOVASCULAR STRESS TEST  11/11/2009   normal study  . CARPAL TUNNEL RELEASE  08/1993   left wrist  . CARPAL TUNNEL RELEASE  01/1997   right  . CORONARY ARTERY BYPASS GRAFT  07/1994   Triple by Dr Ceasar Mons  . dental implants    . HAND SURGERY Right 09/2002   Right hand pulley release    . LEFT HEART CATHETERIZATION WITH CORONARY/GRAFT ANGIOGRAM N/A 06/11/2013   Procedure: LEFT HEART CATHETERIZATION WITH [name];  Surgeon: Thayer Headings, MD;  Location[facility] CATH LAB;  Service: Cardiovascular;  Laterality: N/A;  . MULTIPLE TOOTH EXTRACTIONS  2000   All upper teeth.  Has full dneture.   [name] RENAL ARTERY STENT  2008  . SHOULDER ARTHROSCOPY WITH ROTATOR CUFF REPAIR Right 09/27/2018   Procedure: Right shoulder mini open rotator cuff repair;  Surgeon: Susa Day, MD;  Location: [facility] ORS;  Service: Orthopedics;  Laterality: Right;  90 mins  . thyroid cyst apiration  05/1997 and 07/1998  . TONSILECTOMY, ADENOIDECTOMY, BILATERAL MYRINGOTOMY AND TUBES  1989  . TOTAL ABDOMINAL HYSTERECTOMY  1992   Family History  Problem Relation Age of Onset  . Heart disease Mother   . Heart attack Mother   . Heart disease Father        before age 67  . Heart attack Father   . Heart disease Sister        before age 33  . Diabetes Sister   . Hyperlipidemia Sister   . Hypertension Sister   . Heart attack Sister   . Liver cancer Sister   . Breast cancer Sister   . Heart disease Brother   . Diabetes Brother   . Hyperlipidemia Brother   . Hypertension Brother   . Heart attack Brother   . AAA (abdominal aortic aneurysm) Brother   . Colon cancer Paternal Grandmother   . Heart disease Brother   . Pancreatic cancer Neg Hx   . Rectal cancer Neg Hx   . Stomach cancer Neg Hx   . Esophageal cancer Neg Hx    Social History   Socioeconomic History  . Marital status: Married    Spouse name: Not on file  . Number of children: Not on file  . Years of education: Not on file  . Highest education level: Not on file  Occupational History  . Not on file  Social Needs  . Financial resource strain: Not on file  . Food insecurity:    Worry: Not on file    Inability: Not on file  . Transportation needs:    Medical: Not on file    Non-medical: Not on file  Tobacco Use  . Smoking  status: Former Smoker    Packs/day: 0.00    Years: 37.00    Pack years: 0.00    Types: Cigarettes    Last attempt to quit: 12/07/1995    Years since quitting: 23.1  . Smokeless tobacco: Never Used  Substance and Sexual Activity  . Alcohol use: Yes    Alcohol/week: 6.0 - 10.0 standard drinks  Types: 6 - 10 Glasses of wine per week    Comment: 2-3 glasses daily  . Drug use: No  . Sexual activity: Not Currently    Partners: Male  Lifestyle  . Physical activity:    Days per week: Not on file    Minutes per session: Not on file  . Stress: Not on file  Relationships  . Social connections:    Talks on phone: Not on file    Gets together: Not on file    Attends religious service: Not on file    Active member of club or organization: Not on file    Attends meetings of clubs or organizations: Not on file    Relationship status: Not on file  Other Topics Concern  . Not on file  Social History Narrative   Lives at home with husband.  Is retired.  Education 4 yrs of college.  No children.   Caffiene 2 cups daily.     Outpatient Encounter Medications as of 01/25/2019  Medication Sig  . albuterol (PROVENTIL HFA;VENTOLIN HFA) 108 (90 Base) MCG/ACT inhaler Inhale 2 puffs into the lungs every 4 (four) hours as needed for wheezing or shortness of breath.  . ALPRAZolam (XANAX) 0.25 MG tablet TAKE 1 TABLET BY MOUTH AT BEDTIME AS NEEDED  . amLODipine (NORVASC) 5 MG tablet Take 1 tablet (5 mg total) by mouth daily.  [name] aspirin 325 MG tablet Take 325 mg by mouth daily.  [name] atorvastatin (LIPITOR) 80 MG tablet Take 1 tablet (80 mg total) by mouth at bedtime.  [name] augmented betamethasone dipropionate (DIPROLENE-AF) 0.05 % cream Apply 1 application topically daily as needed. For hands  . ciclopirox (LOPROX) 0.77 % cream Apply 1 application topically daily as needed (hands).   . clobetasol cream (TEMOVATE) 7.37 % Apply 1 application topically 2 (two) times daily as needed (hands).   . cycloSPORINE  (RESTASIS) 0.05 % ophthalmic emulsion Place 1 drop into both eyes at bedtime.  [name] estradiol (ESTRACE) 0.5 MG tablet TAKE TWO TABLETS (1 MG TOTAL) BY MOUTH DAILY.  [name] gabapentin (NEURONTIN) 300 MG capsule Take 1-2 capsules (300-600 mg total) by mouth 3 (three) times daily.  . hydrALAZINE (APRESOLINE) 10 MG tablet TAKE 1 TABLET BY MOUTH THREE TIMES A DAY  . HYDROcodone-homatropine (HYCODAN) 5-1.5 MG/5ML syrup Take 5 mLs by mouth every 8 (eight) hours as needed for cough.  . Ipratropium-Albuterol (COMBIVENT) 20-100 MCG/ACT AERS respimat Inhale 1 puff into the lungs every 6 (six) hours as needed for wheezing or shortness of breath.  . losartan-hydrochlorothiazide (HYZAAR) 100-25 MG tablet Take 1 tablet by mouth daily.  . nitroGLYCERIN (NITROSTAT) 0.4 MG SL tablet Place 1 tablet (0.4 mg total) under the tongue every 5 (five) minutes as needed for chest pain.  [name] omeprazole (PRILOSEC) 40 MG capsule TAKE 1 CAPSULE (40 MG TOTAL) BY MOUTH 2 (TWO) TIMES DAILY.  [name] potassium chloride (K-DUR) 10 MEQ tablet Take 2 tablets (20 mEq total) by mouth 2 (two) times daily.  [name] torsemide (DEMADEX) 20 MG tablet Take 1 tablet (20 mg total) by mouth daily as needed.  . tretinoin (RETIN-A) 0.1 % cream Apply 1 application topically every other day.  . triamcinolone cream (KENALOG) 0.1 % APPLY ON THE SKIN AS DIRECTED TWICE DAILY X 2 WEEKS  . Vitamin D, Ergocalciferol, 2000 units CAPS Take 1 capsule by mouth daily.    No facility-administered encounter medications on file as of 01/25/2019.     Activities of Daily Living In your present state  of health, do you have any difficulty performing the following activities: 01/25/2019 09/27/2018  Hearing? N N  Vision? N N  Difficulty concentrating or making decisions? N N  Walking or climbing stairs? Y N  Comment back issues, bursitis in hips, balance -  Dressing or bathing? N N  Doing errands, shopping? N Y  Conservation officer, nature and eating ? N -  Using the Toilet? N -  In the past six  months, have you accidently leaked urine? N -  Do you have problems with loss of bowel control? N -  Managing your Medications? N -  Managing your Finances? N -  Housekeeping or managing your Housekeeping? N -  Some recent data might be hidden    Patient Care Team: [name], MD as PCP - General (Family Medicine) [place], Peter M, MD as Consulting Physician (Cardiology) Vicie Mutters, MD as Consulting Physician (Otolaryngology) Ladene Artist, MD as Consulting Physician (Gastroenterology) Penni Bombard, MD as Consulting Physician (Neurology) Reynold Bowen, MD as Consulting Physician (Endocrinology) Sharyne Peach, MD as Consulting Physician (Ophthalmology) Renelda Mom, DMD as Consulting Physician (Dentistry) Rolm Bookbinder, MD as Consulting Physician (Dermatology) Manson Passey, [place] (Specialist) Woodfin Ganja (Dentistry)    Assessment:   This is a routine wellness examination for Megan [name].  Exercise Activities and Dietary recommendations Current Exercise Habits: The patient does not participate in regular exercise at present(PT for shoulder), Exercise limited by: orthopedic condition(s)   Diet (meal preparation, eat out, water intake, caffeinated beverages, dairy products, fruits and vegetables): Drinks tea, water and wine.   Breakfast: Eggs/bacon; hot tea/OJ Lunch: occasionally skips; eats out Dinner: protein and veggies.   Goals    . Patient Stated     Maintain current health by staying active.     . Weight (lb) < 155 lb (70.3 kg)     Lose weight by fasting.        Fall Risk Fall Risk  01/25/2019 11/14/2018 01/12/2018 10/13/2017  Falls in the past year? 0 0 No No    Depression Screen PHQ 2/9 Scores 01/25/2019 01/12/2018 10/13/2017  PHQ - 2 Score 0 0 0     Cognitive Function MMSE - Mini Mental State Exam 01/25/2019  Orientation to time 5  Orientation to Place 5  Registration 3  Attention/ Calculation 5  Recall 0  Language- name 2 objects 2  Language- repeat 1    Language- follow 3 step command 3  Language- read & follow direction 1  Write a sentence 1  Copy design 1  Total score 27        Immunization History  Administered Date(s) Administered  . Influenza, High Dose Seasonal PF 08/13/2017, 08/26/2018  . Influenza, Seasonal, Injecte, Preservative Fre 09/21/2014, 09/06/2015, 07/21/2016  . Influenza,inj,quad, With Preservative 08/06/2017  . Influenza-Unspecified 08/10/2017  . PPD Test 09/13/1979  . Pneumococcal Conjugate-13 12/31/2016  . Pneumococcal Polysaccharide-23 12/16/2014  . Td 05/13/2008  . Tdap 08/09/2012  . Zoster 11/03/2010  . Zoster Recombinat (Shingrix) 01/13/2018, 03/14/2018    Screening Tests Health Maintenance  Topic Date Due  . HEMOGLOBIN A1C  03/23/2019  . MAMMOGRAM  08/30/2019  . OPHTHALMOLOGY EXAM  09/06/2019  . COLONOSCOPY  09/20/2019  . TETANUS/TDAP  08/09/2022  . INFLUENZA VACCINE  Completed  . Hepatitis C Screening  Completed  . PNA vac Low Risk Adult  Completed  . FOOT EXAM  Discontinued        Plan:    Continue doing brain stimulating activities (puzzles, reading, adult coloring books, staying  active) to keep memory sharp.   I have personally reviewed and noted the following in the patient's chart:   . Medical and social history . Use of alcohol, tobacco or illicit drugs  . Current medications and supplements . Functional ability and status . Nutritional status . Physical activity . Advanced directives . List of other physicians . Hospitalizations, surgeries, and ER visits in previous 12 months . Vitals . Screenings to include cognitive, depression, and falls . Referrals and appointments  In addition, I have reviewed and discussed with patient certain preventive protocols, quality metrics, and best practice recommendations. A written personalized care plan for preventive services as well as general preventive health recommendations were provided to patient.     Gerilyn Nestle,  RN  01/25/2019

## 2019-01-25 ENCOUNTER — Other Ambulatory Visit: Payer: Self-pay

## 2019-01-25 ENCOUNTER — Encounter: Payer: Self-pay | Admitting: Family Medicine

## 2019-01-25 ENCOUNTER — Ambulatory Visit (INDEPENDENT_AMBULATORY_CARE_PROVIDER_SITE_OTHER): Payer: Medicare Other | Admitting: Family Medicine

## 2019-01-25 ENCOUNTER — Ambulatory Visit (INDEPENDENT_AMBULATORY_CARE_PROVIDER_SITE_OTHER): Payer: Medicare Other

## 2019-01-25 VITALS — BP 130/68 | HR 68 | Temp 98.2°F | Resp 16 | Ht 61.0 in | Wt 168.4 lb

## 2019-01-25 VITALS — BP 130/68 | HR 68 | Temp 98.2°F | Resp 18 | Ht 61.0 in | Wt 168.6 lb

## 2019-01-25 DIAGNOSIS — M25611 Stiffness of right shoulder, not elsewhere classified: Secondary | ICD-10-CM | POA: Diagnosis not present

## 2019-01-25 DIAGNOSIS — M797 Fibromyalgia: Secondary | ICD-10-CM | POA: Diagnosis not present

## 2019-01-25 DIAGNOSIS — E782 Mixed hyperlipidemia: Secondary | ICD-10-CM

## 2019-01-25 DIAGNOSIS — I10 Essential (primary) hypertension: Secondary | ICD-10-CM | POA: Diagnosis not present

## 2019-01-25 DIAGNOSIS — K760 Fatty (change of) liver, not elsewhere classified: Secondary | ICD-10-CM

## 2019-01-25 DIAGNOSIS — Z0000 Encounter for general adult medical examination without abnormal findings: Secondary | ICD-10-CM

## 2019-01-25 DIAGNOSIS — R7301 Impaired fasting glucose: Secondary | ICD-10-CM | POA: Diagnosis not present

## 2019-01-25 DIAGNOSIS — E669 Obesity, unspecified: Secondary | ICD-10-CM

## 2019-01-25 DIAGNOSIS — J449 Chronic obstructive pulmonary disease, unspecified: Secondary | ICD-10-CM

## 2019-01-25 LAB — CBC WITH DIFFERENTIAL/PLATELET
Basophils Absolute: 0 10*3/uL (ref 0.0–0.1)
Basophils Relative: 0.5 % (ref 0.0–3.0)
Eosinophils Absolute: 0.3 10*3/uL (ref 0.0–0.7)
Eosinophils Relative: 4.2 % (ref 0.0–5.0)
HCT: 43.8 % (ref 36.0–46.0)
Hemoglobin: 14.9 g/dL (ref 12.0–15.0)
Lymphocytes Relative: 25.2 % (ref 12.0–46.0)
Lymphs Abs: 2.1 10*3/uL (ref 0.7–4.0)
MCHC: 34.1 g/dL (ref 30.0–36.0)
MCV: 89.3 fl (ref 78.0–100.0)
Monocytes Absolute: 0.7 10*3/uL (ref 0.1–1.0)
Monocytes Relative: 8.1 % (ref 3.0–12.0)
Neutro Abs: 5.2 10*3/uL (ref 1.4–7.7)
Neutrophils Relative %: 62 % (ref 43.0–77.0)
Platelets: 197 10*3/uL (ref 150.0–400.0)
RBC: 4.9 Mil/uL (ref 3.87–5.11)
RDW: 13.5 % (ref 11.5–15.5)
WBC: 8.3 10*3/uL (ref 4.0–10.5)

## 2019-01-25 LAB — COMPREHENSIVE METABOLIC PANEL
ALT: 51 U/L — ABNORMAL HIGH (ref 0–35)
AST: 51 U/L — ABNORMAL HIGH (ref 0–37)
Albumin: 3.8 g/dL (ref 3.5–5.2)
Alkaline Phosphatase: 63 U/L (ref 39–117)
BUN: 10 mg/dL (ref 6–23)
CO2: 31 mEq/L (ref 19–32)
Calcium: 10.6 mg/dL — ABNORMAL HIGH (ref 8.4–10.5)
Chloride: 105 mEq/L (ref 96–112)
Creatinine, Ser: 0.69 mg/dL (ref 0.40–1.20)
GFR: 83.07 mL/min (ref >60.00)
Glucose, Bld: 108 mg/dL — ABNORMAL HIGH (ref 70–99)
Potassium: 4.9 mEq/L (ref 3.5–5.1)
Sodium: 144 mEq/L (ref 135–145)
Total Bilirubin: 0.8 mg/dL (ref 0.2–1.2)
Total Protein: 5.9 g/dL — ABNORMAL LOW (ref 6.0–8.3)

## 2019-01-25 LAB — HEMOGLOBIN A1C: Hgb A1c MFr Bld: 6.6 % — ABNORMAL HIGH (ref 4.6–6.5)

## 2019-01-25 LAB — LIPID PANEL
Cholesterol: 130 mg/dL (ref 0–200)
HDL: 42 mg/dL (ref >39.00)
NonHDL: 87.8
Total CHOL/HDL Ratio: 3
Triglycerides: 218 mg/dL — ABNORMAL HIGH (ref 0.0–149.0)
VLDL: 43.6 mg/dL — ABNORMAL HIGH (ref 0.0–40.0)

## 2019-01-25 LAB — TSH: TSH: 2.05 u[IU]/mL (ref 0.35–4.50)

## 2019-01-25 LAB — LDL CHOLESTEROL, DIRECT: Direct LDL: 122 mg/dL

## 2019-01-25 NOTE — Patient Instructions (Signed)
Please return in 6 months for follow up of your hypertension.  I will release your lab results to you on your MyChart account with further instructions. Please reply with any questions.    If you have any questions or concerns, please don't hesitate to send me a message via MyChart or call the office at [phone]. Thank you for visiting with [place] today! It's our pleasure caring for you.

## 2019-01-25 NOTE — Progress Notes (Signed)
I have reviewed the documentation from the recent AWV done by Kim Broome; I agree with the documentation and will follow up on any recommendations or abnormal findings as suggested.  

## 2019-01-25 NOTE — Progress Notes (Signed)
Subjective  Chief Complaint  Patient presents with  . Annual Exam    HPI: Megan [name] is a 73 y.o. female who presents to [place] at [facility] today for a Female Wellness Visit. She also has the concerns and/or needs as listed above in the chief complaint. These will be addressed in addition to the Health Maintenance Visit.   Wellness Visit: annual visit with health maintenance review and exam without Pap   HM screens are up to date as are imms. Feels well overall. Sees multiple specialists  AWV this am, nl Chronic disease f/u and/or acute problem visit: (deemed necessary to be done in addition to the wellness visit):  Chronic problems are well controlled. Requests fasting labs for surveillance of sugars, lipids and renal function. Tolerating all meds.   ROS: + cough with nasal congestion w/o fever or GERD sxs. Has copd but no wheezing or sob. + R mid abdominal pain with mvt intermittently x 1 weeks. Denies constipation, diarrhea, melena or change in appetite.  Assessment  1. Annual physical exam   2. Essential hypertension   3. IFG (impaired fasting glucose)   4. Mixed hyperlipidemia   5. Fibromyalgia      Plan  Female Wellness Visit:  Age appropriate Health Maintenance and Prevention measures were discussed with patient. Included topics are cancer screening recommendations, ways to keep healthy (see AVS) including dietary and exercise recommendations, regular eye and dental care, use of seat belts, and avoidance of moderate alcohol use and tobacco use.   BMI: discussed patient's BMI and encouraged positive lifestyle modifications to help get to or maintain a target BMI.  HM needs and immunizations were addressed and ordered. See below for orders. See HM and immunization section for updates.  Routine labs and screening tests ordered including cmp, cbc and lipids where appropriate.  Discussed recommendations regarding Vit D and calcium  supplementation (see AVS)  Chronic disease management visit and/or acute problem visit:  Chronic problems: no changes in meds. Check labs.   Abdominal pain - most c/w musculoskeletal or constipaitn. Supportive care discussed  Cough: AR: start oral antihistamine.  Follow up: No follow-ups on file.  Orders Placed This Encounter  Procedures  . CBC with Differential/Platelet  . Comprehensive metabolic panel  . Lipid panel  . TSH  . Hemoglobin A1c   No orders of the defined types were placed in this encounter.     Lifestyle: Body mass index is 31.86 kg/m. Wt Readings from Last 3 Encounters:  01/25/19 168 lb 9.6 oz (76.5 kg)  01/25/19 168 lb 6 oz (76.4 kg)  12/18/18 169 lb (76.7 kg)    Patient Active Problem List   Diagnosis Date Noted  . Essential hypertension 07/10/2018    Priority: High  . IFG (impaired fasting glucose) 11/10/2017    Priority: High  . 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   . Coronary artery disease     Priority: High    a. s/p CABG in 1995;  b. 05/2013 Neg MV, EF 82%;  c. 06/2013 Cath: LM nl, LAD 40-50p, LCX nl, RCA 80p/1104m, VG->RCA->PDA->PLSA min irregs, LIMA->LAD atretic, vigorous LV fxn.   . Peripheral vascular disease with claudication ([place])     Priority: High  . Benign hypertensive heart disease without heart failure 05/18/2011    Priority: High  . History of abdominal aortic aneurysm repair 05/18/2011    Priority: High    Stent  graft 2009    . Mixed hyperlipidemia     Priority: High  . DJD (degenerative joint disease), lumbosacral 11/10/2017    Priority: Medium  . Fibromyalgia 11/10/2017    Priority: Medium  . GERD (gastroesophageal reflux disease) 11/10/2017    Priority: Medium  . Hormone replacement therapy (HRT) 11/10/2017    Priority: Medium  . Fatty liver 11/10/2017    Priority: Medium  . Diverticulosis 09/19/2012    Priority: Medium    Severe by colonoscopy 09/2014, [name]   .  History of renal artery stenosis 05/18/2011    Priority: Medium    Right renal artery stent 2008   . Dyshidrotic eczema 11/10/2017    Priority: Low  . Osteopenia 11/10/2017    Priority: Low    Last Dexa 2009; declines further testing; does not tolerate biphosphanates   . Does use hearing aid 11/10/2017    Priority: Low  . COPD with chronic bronchitis ([place]) 09/28/2018  . Right rotator cuff tear 09/27/2018  . Complete rotator cuff tear 09/27/2018   Health Maintenance  Topic Date Due  . HEMOGLOBIN A1C  03/23/2019  . MAMMOGRAM  08/30/2019  . OPHTHALMOLOGY EXAM  09/06/2019  . COLONOSCOPY  09/20/2019  . TETANUS/TDAP  08/09/2022  . INFLUENZA VACCINE  Completed  . Hepatitis C Screening  Completed  . PNA vac Low Risk Adult  Completed  . FOOT EXAM  Discontinued   Immunization History  Administered Date(s) Administered  . Influenza, High Dose Seasonal PF 08/13/2017, 08/26/2018  . Influenza, Seasonal, Injecte, Preservative Fre 09/21/2014, 09/06/2015, 07/21/2016  . Influenza,inj,quad, With Preservative 08/06/2017  . Influenza-Unspecified 08/10/2017  . PPD Test 09/13/1979  . Pneumococcal Conjugate-13 12/31/2016  . Pneumococcal Polysaccharide-23 12/16/2014  . Td 05/13/2008  . Tdap 08/09/2012  . Zoster 11/03/2010  . Zoster Recombinat (Shingrix) 01/13/2018, 03/14/2018   We updated and reviewed the patient's past history in detail and it is documented below. Allergies: Patient is allergic to anticoagulant compound; morphine and related; bextra [valdecoxib]; hydrocod polst-cpm polst er; lipitor [atorvastatin]; mevacor [lovastatin]; plavix  [clopidogrel bisulfate]; zocor [simvastatin]; doxycycline; metoprolol; plavix [clopidogrel bisulfate]; codeine; hydrocod polst-cpm polst er; and lisinopril. Past Medical History Patient  has a past medical history of AAA (abdominal aortic aneurysm) ([place]), Adenomatous colon polyp (05/2001), Adenomatous duodenal polyp, Allergy, Anginal pain ([place]), COPD  (chronic obstructive pulmonary disease) ([place]), Coronary artery disease, Diabetes mellitus without complication ([place]), Diverticulosis, Eczema, dyshidrotic, Eczema, dyshidrotic (1986), Fibromyalgia, GERD (gastroesophageal reflux disease), Hyperlipidemia, Hypertension, Migraine, Myocardial infarction ([place]), Osteoarthritis, Peripheral arterial disease ([place]), and Renal artery stenosis ([place]). Past Surgical History Patient  has a past surgical history that includes Renal artery stent (2008); Coronary artery bypass graft (07/1994); Carpal tunnel release (08/1993); Tonsilectomy, adenoidectomy, bilateral myringotomy and tubes (1989); Total abdominal hysterectomy (1992); Carpal tunnel release (01/1997); thyroid cyst apiration (05/1997 and 07/1998); Cardiovascular stress test (11/11/2009); Cardiac catheterization; Abdominal aortic aneurysm repair (2009); left heart catheterization with coronary/graft angiogram (N/A, 06/11/2013); abdominal aortagram (N/A, 03/06/2014); Hand surgery (Right, 09/2002); Multiple tooth extractions (2000); Abdominal hysterectomy; dental implants; Breast biopsy; and Shoulder arthroscopy with rotator cuff repair (Right, 09/27/2018). Family History: Patient family history includes AAA (abdominal aortic aneurysm) in her brother; Breast cancer in her sister; Colon cancer in her paternal grandmother; Diabetes in her brother and sister; Heart attack in her brother, father, mother, and sister; Heart disease in her brother, brother, father, mother, and sister; Hyperlipidemia in her brother and sister; Hypertension in her brother and sister; Liver cancer in her sister. Social History:  Patient  reports that she quit smoking about 23 years ago. Her smoking use included cigarettes. She smoked 0.00 packs per day for 37.00 years. She has never used smokeless tobacco. She reports current alcohol use of about 6.0 - 10.0 standard drinks of alcohol per week. She reports that she does not use drugs.  Review of  Systems: Constitutional: negative for fever or malaise Ophthalmic: negative for photophobia, double vision or loss of vision Cardiovascular: negative for chest pain, dyspnea on exertion, or new LE swelling Respiratory: negative for SOB or persistent cough Gastrointestinal: negative for  change in bowel habits or melena Genitourinary: negative for dysuria or gross hematuria, no abnormal uterine bleeding or disharge Musculoskeletal: negative for new gait disturbance or muscular weakness Integumentary: negative for new or persistent rashes, no breast lumps Neurological: negative for TIA or stroke symptoms Psychiatric: negative for SI or delusions Allergic/Immunologic: negative for hives  Patient Care Team    Relationship Specialty Notifications Start End  [name], MD PCP - General Family Medicine  05/21/13   [place], Peter M, MD Consulting Physician Cardiology  05/24/17   Vicie Mutters, MD Consulting Physician Otolaryngology  11/10/17   Ladene Artist, MD Consulting Physician Gastroenterology  11/10/17   Penni Bombard, MD Consulting Physician Neurology  11/10/17   Reynold Bowen, MD Consulting Physician Endocrinology  11/10/17   Sharyne Peach, MD Consulting Physician Ophthalmology  11/10/17   Renelda Mom, DMD Consulting Physician Dentistry  11/10/17   Rolm Bookbinder, MD Consulting Physician Dermatology  01/12/18   Ortho, [place]  Specialist  01/12/18   Woodfin Ganja  Dentistry  01/25/19     Objective  Vitals: BP 130/68   Pulse 68   Temp 98.2 F (36.8 C) (Oral)   Resp 18   Ht 5\' 1"  (1.549 m)   Wt 168 lb 9.6 oz (76.5 kg)   SpO2 97%   BMI 31.86 kg/m  General:  Well developed, well nourished, no acute distress  Psych:  Alert and orientedx3,normal mood and affect HEENT:  Normocephalic, atraumatic, non-icteric sclera, PERRL, oropharynx is clear without mass or exudate, supple neck without adenopathy, mass or thyromegaly Cardiovascular:  Normal S1, S2, RRR without gallop, rub or murmur,  nondisplaced PMI Respiratory:  Good breath sounds bilaterally, CTAB with normal respiratory effort Gastrointestinal: normal bowel sounds, soft, non-tender, no noted masses. No HSM MSK: no deformities, contusions. Joints are without erythema or swelling. Spine and CVA region are nontender Skin:  Warm, no rashes or suspicious lesions noted Neurologic:    Mental status is normal. CN 2-11 are normal. Gross motor and sensory exams are normal. Normal gait. No tremor Breast Exam: pt declines   Commons side effects, risks, benefits, and alternatives for medications and treatment plan prescribed today were discussed, and the patient expressed understanding of the given instructions. Patient is instructed to call or message via MyChart if he/she has any questions or concerns regarding our treatment plan. No barriers to understanding were identified. We discussed Red Flag symptoms and signs in detail. Patient expressed understanding regarding what to do in case of urgent or emergency type symptoms.   Medication list was reconciled, printed and provided to the patient in AVS. Patient instructions and summary information was reviewed with the patient as documented in the AVS. This note was prepared with assistance of Dragon voice recognition software. Occasional wrong-word or sound-a-like substitutions may have occurred due to the inherent limitations of voice recognition software

## 2019-01-25 NOTE — Patient Instructions (Addendum)
Continue doing brain stimulating activities (puzzles, reading, adult coloring books, staying active) to keep memory sharp.    Health Maintenance, Female Adopting a healthy lifestyle and getting preventive care can go a long way to promote health and wellness. Talk with your health care provider about what schedule of regular examinations is right for you. This is a good chance for you to check in with your provider about disease prevention and staying healthy. In between checkups, there are plenty of things you can do on your own. Experts have done a lot of research about which lifestyle changes and preventive measures are most likely to keep you healthy. Ask your health care provider for more information. Weight and diet Eat a healthy diet  Be sure to include plenty of vegetables, fruits, low-fat dairy products, and lean protein.  Do not eat a lot of foods high in solid fats, added sugars, or salt.  Get regular exercise. This is one of the most important things you can do for your health. ? Most adults should exercise for at least 150 minutes each week. The exercise should increase your heart rate and make you sweat (moderate-intensity exercise). ? Most adults should also do strengthening exercises at least twice a week. This is in addition to the moderate-intensity exercise. Maintain a healthy weight  Body mass index (BMI) is a measurement that can be used to identify possible weight problems. It estimates body fat based on height and weight. Your health care provider can help determine your BMI and help you achieve or maintain a healthy weight.  For females 33 years of age and older: ? A BMI below 18.5 is considered underweight. ? A BMI of 18.5 to 24.9 is normal. ? A BMI of 25 to 29.9 is considered overweight. ? A BMI of 30 and above is considered obese. Watch levels of cholesterol and blood lipids  You should start having your blood tested for lipids and cholesterol at 75 years of age,  then have this test every 5 years.  You may need to have your cholesterol levels checked more often if: ? Your lipid or cholesterol levels are high. ? You are older than 75 years of age. ? You are at high risk for heart disease. Cancer screening Lung Cancer  Lung cancer screening is recommended for adults 32-33 years old who are at high risk for lung cancer because of a history of smoking.  A yearly low-dose CT scan of the lungs is recommended for people who: ? Currently smoke. ? Have quit within the past 15 years. ? Have at least a 30-pack-year history of smoking. A pack year is smoking an average of one pack of cigarettes a day for 1 year.  Yearly screening should continue until it has been 15 years since you quit.  Yearly screening should stop if you develop a health problem that would prevent you from having lung cancer treatment. Breast Cancer  Practice breast self-awareness. This means understanding how your breasts normally appear and feel.  It also means doing regular breast self-exams. Let your health care provider know about any changes, no matter how small.  If you are in your 20s or 30s, you should have a clinical breast exam (CBE) by a health care provider every 1-3 years as part of a regular health exam.  If you are 29 or older, have a CBE every year. Also consider having a breast X-ray (mammogram) every year.  If you have a family history of breast cancer, talk  to your health care provider about genetic screening.  If you are at high risk for breast cancer, talk to your health care provider about having an MRI and a mammogram every year.  Breast cancer gene (BRCA) assessment is recommended for women who have family members with BRCA-related cancers. BRCA-related cancers include: ? Breast. ? Ovarian. ? Tubal. ? Peritoneal cancers.  Results of the assessment will determine the need for genetic counseling and BRCA1 and BRCA2 testing. Cervical Cancer Your health  care provider may recommend that you be screened regularly for cancer of the pelvic organs (ovaries, uterus, and vagina). This screening involves a pelvic examination, including checking for microscopic changes to the surface of your cervix (Pap test). You may be encouraged to have this screening done every 3 years, beginning at age 12.  For women ages 10-65, health care providers may recommend pelvic exams and Pap testing every 3 years, or they may recommend the Pap and pelvic exam, combined with testing for human papilloma virus (HPV), every 5 years. Some types of HPV increase your risk of cervical cancer. Testing for HPV may also be done on women of any age with unclear Pap test results.  Other health care providers may not recommend any screening for nonpregnant women who are considered low risk for pelvic cancer and who do not have symptoms. Ask your health care provider if a screening pelvic exam is right for you.  If you have had past treatment for cervical cancer or a condition that could lead to cancer, you need Pap tests and screening for cancer for at least 20 years after your treatment. If Pap tests have been discontinued, your risk factors (such as having a new sexual partner) need to be reassessed to determine if screening should resume. Some women have medical problems that increase the chance of getting cervical cancer. In these cases, your health care provider may recommend more frequent screening and Pap tests. Colorectal Cancer  This type of cancer can be detected and often prevented.  Routine colorectal cancer screening usually begins at 75 years of age and continues through 75 years of age.  Your health care provider may recommend screening at an earlier age if you have risk factors for colon cancer.  Your health care provider may also recommend using home test kits to check for hidden blood in the stool.  A small camera at the end of a tube can be used to examine your colon  directly (sigmoidoscopy or colonoscopy). This is done to check for the earliest forms of colorectal cancer.  Routine screening usually begins at age 50.  Direct examination of the colon should be repeated every 5-10 years through 75 years of age. However, you may need to be screened more often if early forms of precancerous polyps or small growths are found. Skin Cancer  Check your skin from head to toe regularly.  Tell your health care provider about any new moles or changes in moles, especially if there is a change in a mole's shape or color.  Also tell your health care provider if you have a mole that is larger than the size of a pencil eraser.  Always use sunscreen. Apply sunscreen liberally and repeatedly throughout the day.  Protect yourself by wearing long sleeves, pants, a wide-brimmed hat, and sunglasses whenever you are outside. Heart disease, diabetes, and high blood pressure  High blood pressure causes heart disease and increases the risk of stroke. High blood pressure is more likely to develop in: ?  People who have blood pressure in the high end of the normal range (130-139/85-89 mm Hg). ? People who are overweight or obese. ? People who are African American.  If you are 32-73 years of age, have your blood pressure checked every 3-5 years. If you are 32 years of age or older, have your blood pressure checked every year. You should have your blood pressure measured twice-once when you are at a hospital or clinic, and once when you are not at a hospital or clinic. Record the average of the two measurements. To check your blood pressure when you are not at a hospital or clinic, you can use: ? An automated blood pressure machine at a pharmacy. ? A home blood pressure monitor.  If you are between 62 years and 65 years old, ask your health care provider if you should take aspirin to prevent strokes.  Have regular diabetes screenings. This involves taking a blood sample to check  your fasting blood sugar level. ? If you are at a normal weight and have a low risk for diabetes, have this test once every three years after 75 years of age. ? If you are overweight and have a high risk for diabetes, consider being tested at a younger age or more often. Preventing infection Hepatitis B  If you have a higher risk for hepatitis B, you should be screened for this virus. You are considered at high risk for hepatitis B if: ? You were born in a country where hepatitis B is common. Ask your health care provider which countries are considered high risk. ? Your parents were born in a high-risk country, and you have not been immunized against hepatitis B (hepatitis B vaccine). ? You have HIV or AIDS. ? You use needles to inject street drugs. ? You live with someone who has hepatitis B. ? You have had sex with someone who has hepatitis B. ? You get hemodialysis treatment. ? You take certain medicines for conditions, including cancer, organ transplantation, and autoimmune conditions. Hepatitis C  Blood testing is recommended for: ? Everyone born from [birth year] through 1967. ? Anyone with known risk factors for hepatitis C. Sexually transmitted infections (STIs)  You should be screened for sexually transmitted infections (STIs) including gonorrhea and chlamydia if: ? You are sexually active and are younger than 75 years of age. ? You are older than 75 years of age and your health care provider tells you that you are at risk for this type of infection. ? Your sexual activity has changed since you were last screened and you are at an increased risk for chlamydia or gonorrhea. Ask your health care provider if you are at risk.  If you do not have HIV, but are at risk, it may be recommended that you take a prescription medicine daily to prevent HIV infection. This is called pre-exposure prophylaxis (PrEP). You are considered at risk if: ? You are sexually active and do not regularly use  condoms or know the HIV status of your partner(s). ? You take drugs by injection. ? You are sexually active with a partner who has HIV. Talk with your health care provider about whether you are at high risk of being infected with HIV. If you choose to begin PrEP, you should first be tested for HIV. You should then be tested every 3 months for as long as you are taking PrEP. Pregnancy  If you are premenopausal and you may become pregnant, ask your health care provider about  preconception counseling.  If you may become pregnant, take 400 to 800 micrograms (mcg) of folic acid every day.  If you want to prevent pregnancy, talk to your health care provider about birth control (contraception). Osteoporosis and menopause  Osteoporosis is a disease in which the bones lose minerals and strength with aging. This can result in serious bone fractures. Your risk for osteoporosis can be identified using a bone density scan.  If you are 22 years of age or older, or if you are at risk for osteoporosis and fractures, ask your health care provider if you should be screened.  Ask your health care provider whether you should take a calcium or vitamin D supplement to lower your risk for osteoporosis.  Menopause may have certain physical symptoms and risks.  Hormone replacement therapy may reduce some of these symptoms and risks. Talk to your health care provider about whether hormone replacement therapy is right for you. Follow these instructions at home:  Schedule regular health, dental, and eye exams.  Stay current with your immunizations.  Do not use any tobacco products including cigarettes, chewing tobacco, or electronic cigarettes.  If you are pregnant, do not drink alcohol.  If you are breastfeeding, limit how much and how often you drink alcohol.  Limit alcohol intake to no more than 1 drink per day for nonpregnant women. One drink equals 12 ounces of beer, 5 ounces of wine, or 1 ounces of  hard liquor.  Do not use street drugs.  Do not share needles.  Ask your health care provider for help if you need support or information about quitting drugs.  Tell your health care provider if you often feel depressed.  Tell your health care provider if you have ever been abused or do not feel safe at home. This information is not intended to replace advice given to you by your health care provider. Make sure you discuss any questions you have with your health care provider. Document Released: 06/07/2011 Document Revised: 04/29/2016 Document Reviewed: 08/26/2015 Elsevier Interactive Patient Education  2019 [employer].

## 2019-01-29 ENCOUNTER — Encounter: Payer: Self-pay | Admitting: Family Medicine

## 2019-01-31 DIAGNOSIS — M545 Low back pain: Secondary | ICD-10-CM | POA: Diagnosis not present

## 2019-01-31 DIAGNOSIS — Z9889 Other specified postprocedural states: Secondary | ICD-10-CM | POA: Diagnosis not present

## 2019-01-31 DIAGNOSIS — M7541 Impingement syndrome of right shoulder: Secondary | ICD-10-CM | POA: Diagnosis not present

## 2019-01-31 DIAGNOSIS — Z4889 Encounter for other specified surgical aftercare: Secondary | ICD-10-CM | POA: Diagnosis not present

## 2019-02-02 ENCOUNTER — Other Ambulatory Visit: Payer: Self-pay | Admitting: Family Medicine

## 2019-02-02 NOTE — Telephone Encounter (Signed)
Copied from [place] [id number]. Topic: Quick Communication - Rx Refill/Question >> Feb 02, 2019 11:16 AM Yvette Rack wrote: Medication: HYDROcodone-homatropine (HYCODAN) 5-1.5 MG/5ML syrup  Has the patient contacted their pharmacy? yes  Preferred Pharmacy (with phone number or street name): [id number] - [place], [place] [phone] (Phone)  [phone] (Fax)  Agent: Please be advised that RX refills may take up to 3 business days. We ask that you follow-up with your pharmacy.

## 2019-02-06 NOTE — Telephone Encounter (Signed)
Pt called to follow up and check on refill for HYDROcodone-homatropine (HYCODAN) 5-1.5 MG/5ML syrup. She has not heard anything. Please advise pt.   [id number] - [place], [place] (Phone) [phone] (Fax)

## 2019-02-06 NOTE — Addendum Note (Signed)
Addended by: Valli Glance F on: 02/06/2019 04:23 PM   Modules accepted: Orders

## 2019-02-08 DIAGNOSIS — H9121 Sudden idiopathic hearing loss, right ear: Secondary | ICD-10-CM | POA: Diagnosis not present

## 2019-02-08 DIAGNOSIS — H903 Sensorineural hearing loss, bilateral: Secondary | ICD-10-CM | POA: Diagnosis not present

## 2019-02-09 ENCOUNTER — Other Ambulatory Visit: Payer: Self-pay | Admitting: *Deleted

## 2019-02-09 MED ORDER — HYDROCODONE-HOMATROPINE 5-1.5 MG/5ML PO SYRP: 5.0000 mL | ORAL_SOLUTION | Freq: Three times a day (TID) | ORAL | 0 refills | Status: DC | PRN

## 2019-02-09 NOTE — Telephone Encounter (Signed)
Reviewed chart.  Pt toleates this cough syrup. First ordered 12/12.  Pt told will refill once. If sxs are not improved, needs f/u. Will NOT refill again. This is not a chronic med for chronic cough.

## 2019-02-18 ENCOUNTER — Other Ambulatory Visit: Payer: Self-pay | Admitting: Gastroenterology

## 2019-02-18 ENCOUNTER — Other Ambulatory Visit: Payer: Self-pay | Admitting: Cardiology

## 2019-02-19 ENCOUNTER — Telehealth: Payer: Self-pay | Admitting: Gastroenterology

## 2019-02-19 MED ORDER — OMEPRAZOLE 40 MG PO CPDR: DELAYED_RELEASE_CAPSULE | ORAL | 0 refills | Status: DC

## 2019-02-19 NOTE — Telephone Encounter (Signed)
Prescription sent to patient's pharmacy and patient notified.  

## 2019-02-22 DIAGNOSIS — M5136 Other intervertebral disc degeneration, lumbar region: Secondary | ICD-10-CM | POA: Insufficient documentation

## 2019-02-22 DIAGNOSIS — H903 Sensorineural hearing loss, bilateral: Secondary | ICD-10-CM | POA: Diagnosis not present

## 2019-02-22 DIAGNOSIS — M51369 Other intervertebral disc degeneration, lumbar region without mention of lumbar back pain or lower extremity pain: Secondary | ICD-10-CM | POA: Insufficient documentation

## 2019-04-03 ENCOUNTER — Ambulatory Visit: Payer: Medicare Other | Admitting: Gastroenterology

## 2019-04-18 ENCOUNTER — Other Ambulatory Visit: Payer: Self-pay | Admitting: Family Medicine

## 2019-04-18 NOTE — Telephone Encounter (Signed)
See note

## 2019-04-18 NOTE — Telephone Encounter (Signed)
Copied from [place]. Topic: Quick Communication - Rx Refill/Question >> Apr 18, 2019 10:50 AM Reyne Dumas L wrote: Medication: HYDROcodone-homatropine (HYCODAN) 5-1.5 MG/5ML syrup  Has the patient contacted their pharmacy? Yes - states she needs a new refill (Agent: If no, request that the patient contact the pharmacy for the refill.) (Agent: If yes, when and what did the pharmacy advise?)  Preferred Pharmacy (with phone number or street name): [id number] - [place], [place] [phone] (Phone) [phone] (Fax)  Agent: Please be advised that RX refills may take up to 3 business days. We ask that you follow-up with your pharmacy.

## 2019-04-19 ENCOUNTER — Other Ambulatory Visit: Payer: Self-pay | Admitting: Cardiology

## 2019-04-19 DIAGNOSIS — F419 Anxiety disorder, unspecified: Secondary | ICD-10-CM

## 2019-04-19 NOTE — Telephone Encounter (Signed)
Ok to refill Xanax for her  Bladyn Tipps [place] MD, [facility]

## 2019-05-09 DIAGNOSIS — M722 Plantar fascial fibromatosis: Secondary | ICD-10-CM | POA: Diagnosis not present

## 2019-05-14 ENCOUNTER — Other Ambulatory Visit: Payer: Self-pay | Admitting: Gastroenterology

## 2019-05-14 NOTE — Telephone Encounter (Signed)
Patient needs an appt.

## 2019-05-16 DIAGNOSIS — L570 Actinic keratosis: Secondary | ICD-10-CM | POA: Diagnosis not present

## 2019-05-16 DIAGNOSIS — L905 Scar conditions and fibrosis of skin: Secondary | ICD-10-CM | POA: Diagnosis not present

## 2019-05-16 DIAGNOSIS — D225 Melanocytic nevi of trunk: Secondary | ICD-10-CM | POA: Diagnosis not present

## 2019-05-16 DIAGNOSIS — L814 Other melanin hyperpigmentation: Secondary | ICD-10-CM | POA: Diagnosis not present

## 2019-05-16 DIAGNOSIS — L821 Other seborrheic keratosis: Secondary | ICD-10-CM | POA: Diagnosis not present

## 2019-05-22 DIAGNOSIS — M722 Plantar fascial fibromatosis: Secondary | ICD-10-CM | POA: Diagnosis not present

## 2019-05-30 DIAGNOSIS — M722 Plantar fascial fibromatosis: Secondary | ICD-10-CM | POA: Diagnosis not present

## 2019-06-07 ENCOUNTER — Other Ambulatory Visit: Payer: Self-pay | Admitting: Cardiology

## 2019-06-12 DIAGNOSIS — M722 Plantar fascial fibromatosis: Secondary | ICD-10-CM | POA: Diagnosis not present

## 2019-07-10 DIAGNOSIS — M722 Plantar fascial fibromatosis: Secondary | ICD-10-CM | POA: Diagnosis not present

## 2019-07-16 ENCOUNTER — Other Ambulatory Visit: Payer: Self-pay | Admitting: Family Medicine

## 2019-07-16 ENCOUNTER — Telehealth: Payer: Self-pay | Admitting: Family

## 2019-07-16 DIAGNOSIS — Z1231 Encounter for screening mammogram for malignant neoplasm of breast: Secondary | ICD-10-CM

## 2019-07-20 ENCOUNTER — Other Ambulatory Visit: Payer: Self-pay | Admitting: *Deleted

## 2019-07-26 ENCOUNTER — Telehealth: Payer: Self-pay | Admitting: *Deleted

## 2019-07-26 DIAGNOSIS — E119 Type 2 diabetes mellitus without complications: Secondary | ICD-10-CM | POA: Diagnosis not present

## 2019-07-26 DIAGNOSIS — I739 Peripheral vascular disease, unspecified: Secondary | ICD-10-CM | POA: Diagnosis not present

## 2019-07-26 DIAGNOSIS — I714 Abdominal aortic aneurysm, without rupture: Secondary | ICD-10-CM | POA: Diagnosis not present

## 2019-07-26 DIAGNOSIS — I25810 Atherosclerosis of coronary artery bypass graft(s) without angina pectoris: Secondary | ICD-10-CM | POA: Diagnosis not present

## 2019-07-26 DIAGNOSIS — I25708 Atherosclerosis of coronary artery bypass graft(s), unspecified, with other forms of angina pectoris: Secondary | ICD-10-CM | POA: Diagnosis not present

## 2019-07-26 DIAGNOSIS — E782 Mixed hyperlipidemia: Secondary | ICD-10-CM | POA: Diagnosis not present

## 2019-07-26 LAB — LIPID PANEL
Chol/HDL Ratio: 3.5 ratio (ref 0.0–4.4)
Cholesterol, Total: 153 mg/dL (ref 100–199)
HDL: 44 mg/dL (ref >39)
LDL Calculated: 61 mg/dL (ref 0–99)
Triglycerides: 241 mg/dL — ABNORMAL HIGH (ref 0–149)
VLDL Cholesterol Cal: 48 mg/dL — ABNORMAL HIGH (ref 5–40)

## 2019-07-26 LAB — BASIC METABOLIC PANEL
BUN/Creatinine Ratio: 19 (ref 12–28)
BUN: 16 mg/dL (ref 8–27)
CO2: 29 mmol/L (ref 20–29)
Calcium: 10.6 mg/dL — ABNORMAL HIGH (ref 8.7–10.3)
Chloride: 93 mmol/L — ABNORMAL LOW (ref 96–106)
Creatinine, Ser: 0.85 mg/dL (ref 0.57–1.00)
GFR calc Af Amer: 78 mL/min/{1.73_m2} (ref >59)
GFR calc non Af Amer: 68 mL/min/{1.73_m2} (ref >59)
Glucose: 123 mg/dL — ABNORMAL HIGH (ref 65–99)
Potassium: 3.9 mmol/L (ref 3.5–5.2)
Sodium: 138 mmol/L (ref 134–144)

## 2019-07-26 LAB — HEPATIC FUNCTION PANEL
ALT: 43 IU/L — ABNORMAL HIGH (ref 0–32)
AST: 39 IU/L (ref 0–40)
Albumin: 4.5 g/dL (ref 3.7–4.7)
Alkaline Phosphatase: 72 IU/L (ref 39–117)
Bilirubin Total: 0.7 mg/dL (ref 0.0–1.2)
Bilirubin, Direct: 0.21 mg/dL (ref 0.00–0.40)
Total Protein: 6.4 g/dL (ref 6.0–8.5)

## 2019-07-26 NOTE — Telephone Encounter (Signed)
Ok to do A1c. Was abnormal in February at 6.6%  Albino Bufford [place] MD, [facility]

## 2019-07-26 NOTE — Telephone Encounter (Signed)
Patient here for lab draw and she request HgBA1c be included. She had one drawn 01/2019 by her medical doctor. Will forward to dr Martinique for approval

## 2019-07-26 NOTE — Telephone Encounter (Signed)
Order placed

## 2019-07-27 ENCOUNTER — Telehealth: Payer: Self-pay

## 2019-07-27 LAB — HEMOGLOBIN A1C
Est. average glucose Bld gHb Est-mCnc: 131 mg/dL
Hgb A1c MFr Bld: 6.2 % — ABNORMAL HIGH (ref 4.8–5.6)

## 2019-07-27 NOTE — Telephone Encounter (Signed)
Spoke to patient lab results given. 

## 2019-07-30 ENCOUNTER — Other Ambulatory Visit: Payer: Self-pay

## 2019-07-30 ENCOUNTER — Encounter: Payer: Self-pay | Admitting: Family Medicine

## 2019-07-30 ENCOUNTER — Ambulatory Visit (INDEPENDENT_AMBULATORY_CARE_PROVIDER_SITE_OTHER): Payer: Medicare Other | Admitting: Family Medicine

## 2019-07-30 VITALS — BP 142/64 | HR 73 | Temp 97.9°F | Resp 16 | Ht 61.0 in | Wt 159.2 lb

## 2019-07-30 DIAGNOSIS — E782 Mixed hyperlipidemia: Secondary | ICD-10-CM

## 2019-07-30 DIAGNOSIS — M797 Fibromyalgia: Secondary | ICD-10-CM

## 2019-07-30 DIAGNOSIS — I10 Essential (primary) hypertension: Secondary | ICD-10-CM

## 2019-07-30 DIAGNOSIS — Z23 Encounter for immunization: Secondary | ICD-10-CM | POA: Diagnosis not present

## 2019-07-30 DIAGNOSIS — J449 Chronic obstructive pulmonary disease, unspecified: Secondary | ICD-10-CM

## 2019-07-30 DIAGNOSIS — Z79890 Hormone replacement therapy (postmenopausal): Secondary | ICD-10-CM

## 2019-07-30 DIAGNOSIS — I119 Hypertensive heart disease without heart failure: Secondary | ICD-10-CM | POA: Diagnosis not present

## 2019-07-30 MED ORDER — ACCU-CHEK SMARTVIEW VI STRP: ORAL_STRIP | 1 refills | Status: DC

## 2019-07-30 MED ORDER — ACCU-CHEK FASTCLIX LANCETS MISC: 1 refills | Status: AC

## 2019-07-30 NOTE — Patient Instructions (Signed)
Please return in Feb 2021 for your annual complete physical; please come fasting. AWV at that time as well.   If you have any questions or concerns, please don't hesitate to send me a message via MyChart or call the office at [phone]. Thank you for visiting with [place] today! It's our pleasure caring for you.

## 2019-07-30 NOTE — Progress Notes (Signed)
Subjective  CC:  Chief Complaint  Patient presents with   Hypertension   Hyperlipidemia   Fibromyalgia    HPI: Megan [name] is a 73 y.o. female who presents to the office today to address the problems listed above in the chief complaint.  Hypertension f/u: Control is good . Pt reports she is doing well. taking medications as instructed, no medication side effects noted, no TIAs, no chest pain on exertion, no dyspnea on exertion, no swelling of ankles. Has some dizziness but this is morelike her inner ear vertigo, not lightheadedness. Sees cards tomorrow. No cp. She denies adverse effects from his BP medications. Compliance with medication is good.   Chronic problems are stable.   HRT: stopped ert and is doing well.   Copd: rare rescue inhaler use, like once months. Not on atrovent. Doesn't need it. occ wheeze w/o sob or doe.   Pain is present but not worsening.   Lipids checked this week: at goal on meds per cards.   Assessment  1. 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 Fibromyalgia   3. Essential hypertension   4. COPD with chronic bronchitis ([place])   5. Need for immunization against influenza      Plan    Hypertension f/u: BP control is well controlled. Has f/u with cards tomoroow  Hyperlipidemia f/u: at goal.   Cad stable  Copd: asymptomatic for the most part. Will continue with rescue inhaler only. Flu shot today  Fibro and DJD: stable.   Education regarding management of these chronic disease states was given. Management strategies discussed on successive visits include dietary and exercise recommendations, goals of achieving and maintaining IBW, and lifestyle modifications aiming for adequate sleep and minimizing stressors.   Follow up: Return in about 6 months (around 01/30/2020) for complete physical, AWV.  Orders Placed This Encounter  Procedures   Flu Vaccine QUAD High Dose(Fluad)   Meds ordered this encounter    Medications   Accu-Chek FastClix Lancets MISC    Sig: Check blood sugar once daily PRN    Dispense:  100 each    Refill:  1   glucose blood (ACCU-CHEK SMARTVIEW) test strip    Sig: Check blood sugar once daily PRN    Dispense:  100 each    Refill:  1      BP Readings from Last 3 Encounters:  07/30/19 (!) 142/64  01/25/19 130/68  01/25/19 130/68   Wt Readings from Last 3 Encounters:  07/30/19 159 lb 3.2 oz (72.2 kg)  01/25/19 168 lb 9.6 oz (76.5 kg)  01/25/19 168 lb 6 oz (76.4 kg)    Lab Results  Component Value Date   CHOL 153 07/26/2019   CHOL 130 01/25/2019   CHOL 150 09/14/2018   Lab Results  Component Value Date   HDL 44 07/26/2019   HDL 42.00 01/25/2019   HDL 52 09/14/2018   Lab Results  Component Value Date   LDLCALC 61 07/26/2019   LDLCALC 66 09/14/2018   LDLCALC 83 06/13/2018   Lab Results  Component Value Date   TRIG 241 (H) 07/26/2019   TRIG 218.0 (H) 01/25/2019   TRIG 160 (H) 09/14/2018   Lab Results  Component Value Date   CHOLHDL 3.5 07/26/2019   CHOLHDL 3 01/25/2019   CHOLHDL 2.9 09/14/2018   Lab Results  Component Value Date   LDLDIRECT 122.0 01/25/2019   LDLDIRECT 87.4 12/09/2014   LDLDIRECT 90.4 01/25/2014   Lab Results  Component Value Date   CREATININE 0.85 07/26/2019   BUN 16 07/26/2019   NA 138 07/26/2019   K 3.9 07/26/2019   CL 93 (L) 07/26/2019   CO2 29 07/26/2019    The 10-year ASCVD risk score Mikey Bussing DC Jr., et al., 2013) is: 38.7%   Values used to calculate the score:     Age: 53 years     Sex: Female     Is Non-Hispanic African American: No     Diabetic: Yes     Tobacco smoker: No     Systolic Blood Pressure: [id number] mmHg     Is BP treated: Yes     HDL Cholesterol: 44 mg/dL     Total Cholesterol: 153 mg/dL  I reviewed the patients updated PMH, FH, and SocHx.    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 05/18/2011    Priority: High   History of abdominal aortic aneurysm repair 05/18/2011    Priority: High   Mixed hyperlipidemia     Priority: High   DJD (degenerative joint disease), lumbosacral 11/10/2017    Priority: Medium   Fibromyalgia 11/10/2017    Priority: Medium   GERD (gastroesophageal reflux disease) 11/10/2017    Priority: Medium   Hormone replacement therapy (HRT) 11/10/2017    Priority: Medium   Fatty liver 11/10/2017    Priority: Medium   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    Priority: Medium   History of renal artery stenosis 05/18/2011    Priority: Medium   Dyshidrotic eczema 11/10/2017    Priority: Low   Osteopenia 11/10/2017    Priority: Low   Does use hearing aid 11/10/2017    Priority: Low   Plantar fasciitis of left foot 05/30/2019   Degeneration of lumbar intervertebral disc 02/22/2019   COPD with chronic bronchitis ([place]) 09/28/2018   Right rotator cuff tear 09/27/2018   Complete rotator cuff tear 09/27/2018    Allergies: Anticoagulant compound, Morphine and related, Bextra [valdecoxib], Hydrocod polst-cpm polst er, Lipitor [atorvastatin], Mevacor [lovastatin], Plavix  [clopidogrel bisulfate], Zocor [simvastatin], Doxycycline, Metoprolol, Plavix [clopidogrel bisulfate], Codeine, Hydrocod polst-cpm polst er, and Lisinopril  Social History: Patient  reports that she quit smoking about 23 years ago. Her smoking use included cigarettes. She smoked 0.00 packs per day for 37.00 years. She has never used smokeless tobacco. She reports current alcohol use of about 6.0 - 10.0 standard drinks of alcohol per week. She reports that she does not use drugs.  Current Meds  Medication  Sig   albuterol (PROVENTIL HFA;VENTOLIN HFA) 108 (90 Base) MCG/ACT inhaler Inhale 2 puffs into the lungs every 4 (four) hours as needed for wheezing or shortness of breath.   ALPRAZolam (XANAX) 0.25 MG tablet TAKE 1 TABLET BY MOUTH AT BEDTIME AS NEEDED   amLODipine (NORVASC) 5 MG tablet Take 1 tablet (5 mg total) by mouth daily.   aspirin 325 MG tablet Take 325 mg by mouth daily.   atorvastatin (LIPITOR) 80 MG tablet Take 1 tablet (80 mg total) by mouth daily. KEEP AUGUST APPOINTMENT   augmented betamethasone dipropionate (DIPROLENE-AF) 0.05 % cream Apply 1 application  topically daily as needed. For hands   B Complex-Biotin-FA (B-100 COMPLEX PO) B-100 Complex   ciclopirox (LOPROX) 0.77 % cream Apply 1 application topically daily as needed (hands).    clobetasol cream (TEMOVATE) AB-123456789 % Apply 1 application topically 2 (two) times daily as needed (hands).    CORDRAN 4 MCG/SQCM TAPE as needed.   cycloSPORINE (RESTASIS) 0.05 % ophthalmic emulsion Place 1 drop into both eyes at bedtime.   gabapentin (NEURONTIN) 300 MG capsule Take 1-2 capsules (300-600 mg total) by mouth 3 (three) times daily.   hydrALAZINE (APRESOLINE) 10 MG tablet TAKE 1 TABLET BY MOUTH THREE TIMES A DAY   losartan-hydrochlorothiazide (HYZAAR) 100-25 MG tablet Take 1 tablet by mouth daily.   Omega-3 Fatty Acids (FISH OIL) 1000 MG CAPS 4,000 mg. Taking 4000 mg daily   omeprazole (PRILOSEC) 40 MG capsule TAKE 1 CAPSULE BY MOUTH TWICE A DAY   potassium chloride (K-DUR) 10 MEQ tablet Take 2 tablets (20 mEq total) by mouth 2 (two) times daily.   torsemide (DEMADEX) 20 MG tablet Take 1 tablet (20 mg total) by mouth daily as needed.   tretinoin (RETIN-A) 0.1 % cream Apply 1 application topically every other day.   triamcinolone cream (KENALOG) 0.1 % APPLY ON THE SKIN AS DIRECTED TWICE DAILY X 2 WEEKS   Vitamin D, Ergocalciferol, 2000 units CAPS Take 1 capsule by mouth daily.    [DISCONTINUED] Ipratropium-Albuterol  (COMBIVENT) 20-100 MCG/ACT AERS respimat Inhale 1 puff into the lungs every 6 (six) hours as needed for wheezing or shortness of breath.    Review of Systems: Cardiovascular: negative for chest pain, palpitations, leg swelling, orthopnea Respiratory: negative for SOB, wheezing or persistent cough Gastrointestinal: negative for abdominal pain Genitourinary: negative for dysuria or gross hematuria  Objective  Vitals: BP (!) 142/64    Pulse 73    Temp 97.9 F (36.6 C) (Tympanic)    Resp 16    Ht 5\' 1"  (1.549 m)    Wt 159 lb 3.2 oz (72.2 kg)    SpO2 96%    BMI 30.08 kg/m  General: no acute distress  Psych:  Alert and oriented, normal mood and affect HEENT:  Normocephalic, atraumatic, supple neck  Cardiovascular:  RRR with murmur. no edema Respiratory:  Good breath sounds bilaterally, CTAB with normal respiratory effort Skin:  Warm, no rashes Neurologic:   Mental status is normal  Commons side effects, risks, benefits, and alternatives for medications and treatment plan prescribed today were discussed, and the patient expressed understanding of the given instructions. Patient is instructed to call or message via MyChart if he/she has any questions or concerns regarding our treatment plan. No barriers to understanding were identified. We discussed Red Flag symptoms and signs in detail. Patient expressed understanding regarding what to do in case of urgent or emergency type symptoms.   Medication list was reconciled, printed and provided to the patient in AVS. Patient instructions and summary information was reviewed with the patient as documented in the AVS. This note was prepared with assistance of Dragon voice recognition software. Occasional wrong-word or sound-a-like substitutions may have occurred due to the inherent limitations of voice recognition software

## 2019-07-31 NOTE — Progress Notes (Signed)
Cardiology Office Note   Date:  08/01/2019   ID:  [name], [name] 10/26/1946, MRN [id number]  PCP:  [name], MD  Cardiologist: Khanh Tanori [place] MD  No chief complaint on file.     History of Present Illness: Megan [name] is a 73 y.o. female who is seen for follow up of CAD, HTN, HL, and PAD.   She has a past history of essential hypertension and  coronary artery disease. Also has a history of dyslipidemia. She is status post coronary artery bypass graft surgery in 1995. She had a renal artery stent for renal artery stenosis in 2008. She had a stent graft of an abdominal aortic aneurysm 2009. She is s/p stenting of the left iliac. She is followed by [name] for PAD. Last CT in May 2018. She had [place] in June 2019 showing no endoleak.    She was hospitalized in July 2014 and had cardiac catheterization which showed that her grafts were open. EF normal.  She was hospitalized 11/15/2014 with which he describes as an aching sensation in her chest.  She underwent a treadmill Myoview on 11/17/14 which showed no ischemia and her ejection fraction was 86%.   In June 2019 she had Abdominal US at [facility] showing AAA of 4 cm.   In October 2019 she underwent right rotator cuff repair.  On follow up today she is doing well. Denies any chest pain, SOB, or palpitations. Tolerating medication well. No claudication  Past Medical History:  Diagnosis Date  . AAA (abdominal aortic aneurysm) ([place])    a. s/p stent grafting in 2009.  . Adenomatous colon polyp 05/2001  . Adenomatous duodenal polyp   . Allergy   . Anginal pain ([place])   . COPD (chronic obstructive pulmonary disease) ([place])   . Coronary artery disease    a. s/p CABG in 1995;  b. 05/2013 Neg MV, EF 82%;  c. 06/2013 Cath: LM nl, LAD 40-50p, LCX nl, RCA 80p/142m, VG->RCA->PDA->PLSA min irregs, LIMA->LAD atretic, vigorous LV fxn.  . Diabetes mellitus without complication (HCC)    history of, resolved. 2017  . Diverticulosis   .  Eczema, dyshidrotic   . Eczema, dyshidrotic 1986   Dr. Mathis Fare  . Fibromyalgia   . GERD (gastroesophageal reflux disease)   . Hyperlipidemia   . Hypertension   . Migraine   . Myocardial infarction ([place])   . Osteoarthritis   . Peripheral arterial disease (HCC)    left leg diagnosed by Dr Mare Ferrari  . Renal artery stenosis ([place])    a. 2008 s/p PTA    Past Surgical History:  Procedure Laterality Date  . ABDOMINAL AORTAGRAM N/A 03/06/2014   Procedure: ABDOMINAL AORTAGRAM;  Surgeon: Rosetta Posner, MD;  Location[facility] CATH LAB;  Service: Cardiovascular;  Laterality: N/A;  . ABDOMINAL AORTIC ANEURYSM REPAIR  2009   stenting  . ABDOMINAL HYSTERECTOMY     Total, 1992  . BREAST BIOPSY    . CARDIAC CATHETERIZATION    . CARDIOVASCULAR STRESS TEST  11/11/2009   normal study  . CARPAL TUNNEL RELEASE  08/1993   left wrist  . CARPAL TUNNEL RELEASE  01/1997   right  . CORONARY ARTERY BYPASS GRAFT  07/1994   Triple by Dr Ceasar Mons  . dental implants    . HAND SURGERY Right 09/2002   Right hand pulley release  . LEFT HEART CATHETERIZATION WITH CORONARY/GRAFT ANGIOGRAM N/A 06/11/2013   Procedure: LEFT HEART CATHETERIZATION WITH CORONARY/GRAFT ANGIOGRAM;  Surgeon: Thayer Headings, MD;  Location: [facility] CATH LAB;  Service: Cardiovascular;  Laterality: N/A;  . MULTIPLE TOOTH EXTRACTIONS  2000   All upper teeth.  Has full dneture.   [name] RENAL ARTERY STENT  2008  . SHOULDER ARTHROSCOPY WITH ROTATOR CUFF REPAIR Right 09/27/2018   Procedure: Right shoulder mini open rotator cuff repair;  Surgeon: Susa Day, MD;  Location: [facility] ORS;  Service: Orthopedics;  Laterality: Right;  90 mins  . thyroid cyst apiration  05/1997 and 07/1998  . TONSILECTOMY, ADENOIDECTOMY, BILATERAL MYRINGOTOMY AND TUBES  1989  . TOTAL ABDOMINAL HYSTERECTOMY  1992     Current Outpatient Medications  Medication Sig Dispense Refill  . Accu-Chek FastClix Lancets MISC Check blood sugar once daily PRN 100 each 1  . albuterol  (PROVENTIL HFA;VENTOLIN HFA) 108 (90 Base) MCG/ACT inhaler Inhale 2 puffs into the lungs every 4 (four) hours as needed for wheezing or shortness of breath. 1 Inhaler 2  . ALPRAZolam (XANAX) 0.25 MG tablet TAKE 1 TABLET BY MOUTH AT BEDTIME AS NEEDED 90 tablet 1  . amLODipine (NORVASC) 5 MG tablet Take 1 tablet (5 mg total) by mouth daily. 90 tablet 3  . aspirin 325 MG tablet Take 325 mg by mouth daily.    [name] atorvastatin (LIPITOR) 80 MG tablet Take 1 tablet (80 mg total) by mouth daily. KEEP AUGUST APPOINTMENT 90 tablet 0  . augmented betamethasone dipropionate (DIPROLENE-AF) 0.05 % cream Apply 1 application topically daily as needed. For hands  1  . B Complex-Biotin-FA (B-100 COMPLEX PO) B-100 Complex    . ciclopirox (LOPROX) 0.77 % cream Apply 1 application topically daily as needed (hands).     . clobetasol cream (TEMOVATE) AB-123456789 % Apply 1 application topically 2 (two) times daily as needed (hands).     . CORDRAN 4 MCG/SQCM TAPE as needed.    . cycloSPORINE (RESTASIS) 0.05 % ophthalmic emulsion Place 1 drop into both eyes at bedtime.    . gabapentin (NEURONTIN) 300 MG capsule Take 1-2 capsules (300-600 mg total) by mouth 3 (three) times daily. 540 capsule 4  . glucose blood (ACCU-CHEK SMARTVIEW) test strip Check blood sugar once daily PRN 100 each 1  . hydrALAZINE (APRESOLINE) 10 MG tablet TAKE 1 TABLET BY MOUTH THREE TIMES A DAY 270 tablet 2  . losartan-hydrochlorothiazide (HYZAAR) 100-25 MG tablet Take 1 tablet by mouth daily. 90 tablet 3  . nitroGLYCERIN (NITROSTAT) 0.4 MG SL tablet Place 1 tablet (0.4 mg total) under the tongue every 5 (five) minutes as needed for chest pain. 25 tablet PRN  . Omega-3 Fatty Acids (FISH OIL) 1000 MG CAPS 4,000 mg. Taking 4000 mg daily 100 capsule 6  . omeprazole (PRILOSEC) 40 MG capsule TAKE 1 CAPSULE BY MOUTH TWICE A DAY 60 capsule 0  . potassium chloride (K-DUR) 10 MEQ tablet Take 2 tablets (20 mEq total) by mouth 2 (two) times daily. 360 tablet 3  .  tretinoin (RETIN-A) 0.1 % cream Apply 1 application topically every other day.    . triamcinolone cream (KENALOG) 0.1 % APPLY ON THE SKIN AS DIRECTED TWICE DAILY X 2 WEEKS    . Vitamin D, Ergocalciferol, 2000 units CAPS Take 1 capsule by mouth daily.     Vanessa Kick Ethyl (VASCEPA) 1 g CAPS Take 2 capsules (2 g total) by mouth 2 (two) times daily. 120 capsule 3  . torsemide (DEMADEX) 20 MG tablet Take 1 tablet (20 mg total) by mouth daily as needed. 30 tablet 6   No  current facility-administered medications for this visit.     Allergies:   Anticoagulant compound, Morphine and related, Bextra [valdecoxib], Hydrocod polst-cpm polst er, Lipitor [atorvastatin], Mevacor [lovastatin], Plavix  [clopidogrel bisulfate], Zocor [simvastatin], Doxycycline, Metoprolol, Plavix [clopidogrel bisulfate], Codeine, Hydrocod polst-cpm polst er, and Lisinopril    Social History:  The patient  reports that she quit smoking about 23 years ago. Her smoking use included cigarettes. She smoked 0.00 packs per day for 37.00 years. She has never used smokeless tobacco. She reports current alcohol use of about 6.0 - 10.0 standard drinks of alcohol per week. She reports that she does not use drugs.   Family History:  The patient's family history includes AAA (abdominal aortic aneurysm) in her brother; Breast cancer in her sister; Colon cancer in her paternal grandmother; Diabetes in her brother and sister; Heart attack in her brother, father, mother, and sister; Heart disease in her brother, brother, father, mother, and sister; Hyperlipidemia in her brother and sister; Hypertension in her brother and sister; Liver cancer in her sister.    ROS:  Please see the history of present illness.   Otherwise, review of systems are positive for none.   All other systems are reviewed and negative.    PHYSICAL EXAM: VS:  BP (!) 149/66   Pulse 71   Temp (!) 97.3 F (36.3 C)   Ht 5\' 1"  (1.549 m)   Wt 159 lb (72.1 kg)   SpO2 95%   BMI  30.04 kg/m  , BMI Body mass index is 30.04 kg/m. GENERAL:  Overweight  WF in NAD HEENT:  PERRL, EOMI, sclera are clear. Oropharynx is clear. NECK:  No jugular venous distention, carotid upstroke brisk and symmetric, no bruits, no thyromegaly or adenopathy LUNGS:  Clear to auscultation bilaterally CHEST:  Unremarkable HEART:  RRR,  PMI not displaced or sustained,S1 and S2 within normal limits, no S3, no S4: no clicks, no rubs, no murmurs ABD:  Soft, nontender. BS +, no masses or bruits. No hepatomegaly, no splenomegaly EXT:  2 + pulses throughout, 1+ edema, no cyanosis no clubbing SKIN:  Warm and dry.  No rashes NEURO:  Alert and oriented x 3. Cranial nerves II through XII intact. PSYCH:  Cognitively intact     EKG:  EKG is not done today.   Recent Labs: 01/25/2019: Hemoglobin 14.9; Platelets 197.0; TSH 2.05 07/26/2019: ALT 43; BUN 16; Creatinine, Ser 0.85; Potassium 3.9; Sodium 138   Labs from primary care 12/15/16: A1c 5.6%. : Cholesterol 167 Trig-209, HDL 52, LDL 73.  AST 51, ALT 65. Normal renal indices. TSH is normal. Dated 06/24/17: CRP <0.3. Glucose 120, AST 58, ALT 80, other chemistries normal.  Dated 09/28/18: normal CBC, CMET  Lipid Panel    Component Value Date/Time   CHOL 153 07/26/2019 0815   TRIG 241 (H) 07/26/2019 0815   HDL 44 07/26/2019 0815   CHOLHDL 3.5 07/26/2019 0815   CHOLHDL 3 01/25/2019 1108   VLDL 43.6 (H) 01/25/2019 1108   LDLCALC 61 07/26/2019 0815   LDLDIRECT 122.0 01/25/2019 1108      Wt Readings from Last 3 Encounters:  08/01/19 159 lb (72.1 kg)  07/30/19 159 lb 3.2 oz (72.2 kg)  01/25/19 168 lb 9.6 oz (76.5 kg)      ASSESSMENT AND PLAN:  1.CAD status post CABG in 1995. Most recent cardiac cath in July 2014 showed no recurrent stenoses and patent grafts. Most recent treadmill Myoview 11/17/14 normal with ejection fraction 86%. She is asymptomatic. She notes that  prior stress tests really haven't been very helpful.  2. HTN with  hypertensive heart disease. BP is under fairly good control. 3. Hypercholesterolemia- LDL is at goal but triglycerides persistently elevated. Will see if we can get her on Vascepa 4 grams daily. If not she will increase fish oil to 4 gr daily.  4. Abnormal liver function studies with mild transaminase elevation.  Improved.  5. Status post stent to right renal artery 2008. Patent by CT in May 2018.  6. Status post stent graft to abdominal aortic aneurysm 2009.  S/p iliac stent in 2016. Stable by [place] in May 7. Peripheral artery disease followed by [name]   Signed, Mariyana Fulop [place] MD, [facility]    08/01/2019 10:28 AM    [place]

## 2019-08-01 ENCOUNTER — Ambulatory Visit: Payer: Medicare Other | Admitting: Cardiology

## 2019-08-01 ENCOUNTER — Other Ambulatory Visit: Payer: Self-pay

## 2019-08-01 ENCOUNTER — Encounter: Payer: Self-pay | Admitting: Cardiology

## 2019-08-01 VITALS — BP 149/66 | HR 71 | Temp 97.3°F | Ht 61.0 in | Wt 159.0 lb

## 2019-08-01 DIAGNOSIS — I714 Abdominal aortic aneurysm, without rupture, unspecified: Secondary | ICD-10-CM

## 2019-08-01 DIAGNOSIS — I25708 Atherosclerosis of coronary artery bypass graft(s), unspecified, with other forms of angina pectoris: Secondary | ICD-10-CM

## 2019-08-01 DIAGNOSIS — E782 Mixed hyperlipidemia: Secondary | ICD-10-CM | POA: Diagnosis not present

## 2019-08-01 DIAGNOSIS — I739 Peripheral vascular disease, unspecified: Secondary | ICD-10-CM

## 2019-08-01 DIAGNOSIS — I119 Hypertensive heart disease without heart failure: Secondary | ICD-10-CM

## 2019-08-01 MED ORDER — VASCEPA 1 G PO CAPS: 2.0000 | ORAL_CAPSULE | Freq: Two times a day (BID) | ORAL | 3 refills | Status: DC

## 2019-08-01 NOTE — Patient Instructions (Signed)
We will attempt to start Vascepa 2 grams twice a day  Continue your other therapy  Follow up 6 months

## 2019-08-10 ENCOUNTER — Other Ambulatory Visit: Payer: Self-pay

## 2019-08-10 ENCOUNTER — Encounter: Payer: Self-pay | Admitting: Family Medicine

## 2019-08-10 ENCOUNTER — Ambulatory Visit (INDEPENDENT_AMBULATORY_CARE_PROVIDER_SITE_OTHER): Payer: Medicare Other | Admitting: Family Medicine

## 2019-08-10 VITALS — Temp 98.5°F

## 2019-08-10 DIAGNOSIS — Z20822 Contact with and (suspected) exposure to covid-19: Secondary | ICD-10-CM

## 2019-08-10 DIAGNOSIS — R059 Cough, unspecified: Secondary | ICD-10-CM

## 2019-08-10 DIAGNOSIS — R05 Cough: Secondary | ICD-10-CM

## 2019-08-10 DIAGNOSIS — R6889 Other general symptoms and signs: Secondary | ICD-10-CM | POA: Diagnosis not present

## 2019-08-10 DIAGNOSIS — J441 Chronic obstructive pulmonary disease with (acute) exacerbation: Secondary | ICD-10-CM | POA: Diagnosis not present

## 2019-08-10 MED ORDER — GUAIFENESIN-CODEINE 100-10 MG/5ML PO SOLN: 5.0000 mL | Freq: Four times a day (QID) | ORAL | 0 refills | Status: DC | PRN

## 2019-08-10 MED ORDER — AZITHROMYCIN 250 MG PO TABS: ORAL_TABLET | ORAL | 0 refills | Status: DC

## 2019-08-10 MED ORDER — PREDNISONE 20 MG PO TABS: ORAL_TABLET | ORAL | 0 refills | Status: DC

## 2019-08-10 NOTE — Progress Notes (Signed)
TELEPHONE ENCOUNTER   Patient verbally agreed to telephone visit and is aware that copayment and coinsurance may apply. Patient was treated using telemedicine according to accepted telemedicine protocols.  Location of the patient: home Location of provider: [facility], [place] of all persons participating in the telemedicine service and role in the encounter: [name], MD Lilli Light, CMA   Subjective  CC:  Chief Complaint  Patient presents with  . URI    Started 9/2 with cough with some pale green mucous, sore/irritated throat, wheezing, and chest tightness.. Has tried Mucinex and using inhaler with miniml relief.. Denies any fever or unsure about COVID exposure    HPI: Megan [name] is a 73 y.o. female who was telephoned today to address the problems listed above in the chief complaint.  73 yo with cad and copd c/o 2 day h/o wheezing responsive to albuterol and productive cough with scratchy throat. No cp or pleuritic cp or sob but using inhaler 2-4 x day. No fever. Denies known covid exposure but has been out of the house. No loss of taste nor smell or gi sxs. Appetite is fine.    ASSESSMENT: 1. COPD with acute exacerbation ([place])   2. Cough      Mild copd exacerbation:  pred burst, albuterol, zpak, rob AC (tolerates codeine cough syrups), covid testing and close f/u if sxs not improving in the next 24 -72 hours. Self quarantine. Hydrate. To ER forsob. Time spent with the patient (non face-to-face time during this virtual encounter): 15 minutes, spent in obtaining information about her symptoms, reviewing her previous labs, evaluations, and treatments, counseling her about her condition (please see the discussed topics above), and developing a plan to further investigate it; the patient was provided an opportunity to ask questions and all were answered. The patient agreed with the plan and demonstrated an understanding of the instructions.   The  patient was advised to call back or seek an in-person evaluation if the symptoms worsen or if the condition fails to improve as anticipated.  Follow up: No follow-ups on file.  01/30/2020  No orders of the defined types were placed in this encounter.  Meds ordered this encounter  Medications  . azithromycin (ZITHROMAX) 250 MG tablet    Sig: Take 2 tabs today, then 1 tab daily for 4 days    Dispense:  1 each    Refill:  0  . predniSONE (DELTASONE) 20 MG tablet    Sig: Take 3 tabs daily for 5 days    Dispense:  15 tablet    Refill:  0  . guaiFENesin-codeine 100-10 MG/5ML syrup    Sig: Take 5 mLs by mouth every 6 (six) hours as needed for cough.    Dispense:  120 mL    Refill:  0     I reviewed the patients updated PMH, FH, and SocHx.    Patient Active Problem List   Diagnosis Date Noted  . COPD with chronic bronchitis ([place]) 09/28/2018    Priority: High  . Essential hypertension 07/10/2018    Priority: High  . IFG (impaired fasting glucose) 11/10/2017    Priority: High  . 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 Coronary artery disease     Priority: High  . Peripheral vascular disease with claudication ([place])     Priority: High  . 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 History  of abdominal aortic aneurysm repair 05/18/2011    Priority: High  . Mixed hyperlipidemia     Priority: High  . DJD (degenerative joint disease), lumbosacral 11/10/2017    Priority: Medium  . Fibromyalgia 11/10/2017    Priority: Medium  . GERD (gastroesophageal reflux disease) 11/10/2017    Priority: Medium  . Fatty liver 11/10/2017    Priority: Medium  . 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    Priority: Medium  . History of renal artery stenosis 05/18/2011    Priority: Medium  . Dyshidrotic eczema 11/10/2017    Priority: Low  . Osteopenia 11/10/2017    Priority: Low  . Does use hearing aid 11/10/2017    Priority: Low  .  Plantar fasciitis of left foot 05/30/2019  . Degeneration of lumbar intervertebral disc 02/22/2019  . Right rotator cuff tear 09/27/2018  . Complete rotator cuff tear 09/27/2018   Current Meds  Medication Sig  . Accu-Chek FastClix Lancets MISC Check blood sugar once daily PRN  . albuterol (PROVENTIL HFA;VENTOLIN HFA) 108 (90 Base) MCG/ACT inhaler Inhale 2 puffs into the lungs every 4 (four) hours as needed for wheezing or shortness of breath.  . ALPRAZolam (XANAX) 0.25 MG tablet TAKE 1 TABLET BY MOUTH AT BEDTIME AS NEEDED  . amLODipine (NORVASC) 5 MG tablet Take 1 tablet (5 mg total) by mouth daily.  [name] aspirin 325 MG tablet Take 325 mg by mouth daily.  [name] atorvastatin (LIPITOR) 80 MG tablet Take 1 tablet (80 mg total) by mouth daily. KEEP AUGUST APPOINTMENT  . augmented betamethasone dipropionate (DIPROLENE-AF) 0.05 % cream Apply 1 application topically daily as needed. For hands  . B Complex-Biotin-FA (B-100 COMPLEX PO) B-100 Complex  . ciclopirox (LOPROX) 0.77 % cream Apply 1 application topically daily as needed (hands).   . clobetasol cream (TEMOVATE) AB-123456789 % Apply 1 application topically 2 (two) times daily as needed (hands).   . CORDRAN 4 MCG/SQCM TAPE as needed.  . cycloSPORINE (RESTASIS) 0.05 % ophthalmic emulsion Place 1 drop into both eyes at bedtime.  . gabapentin (NEURONTIN) 300 MG capsule Take 1-2 capsules (300-600 mg total) by mouth 3 (three) times daily.  [name] glucose blood (ACCU-CHEK SMARTVIEW) test strip Check blood sugar once daily PRN  . hydrALAZINE (APRESOLINE) 10 MG tablet TAKE 1 TABLET BY MOUTH THREE TIMES A DAY  . Icosapent Ethyl (VASCEPA) 1 g CAPS Take 2 capsules (2 g total) by mouth 2 (two) times daily.  [name] losartan-hydrochlorothiazide (HYZAAR) 100-25 MG tablet Take 1 tablet by mouth daily.  . nitroGLYCERIN (NITROSTAT) 0.4 MG SL tablet Place 1 tablet (0.4 mg total) under the tongue every 5 (five) minutes as needed for chest pain.  . Omega-3 Fatty Acids (FISH OIL) 1000 MG  CAPS 4,000 mg. Taking 4000 mg daily  . omeprazole (PRILOSEC) 40 MG capsule TAKE 1 CAPSULE BY MOUTH TWICE A DAY  . potassium chloride (K-DUR) 10 MEQ tablet Take 2 tablets (20 mEq total) by mouth 2 (two) times daily.  [name] tretinoin (RETIN-A) 0.1 % cream Apply 1 application topically every other day.  . triamcinolone cream (KENALOG) 0.1 % APPLY ON THE SKIN AS DIRECTED TWICE DAILY X 2 WEEKS  . Vitamin D, Ergocalciferol, 2000 units CAPS Take 1 capsule by mouth daily.     Allergies: Patient is allergic to anticoagulant compound; morphine and related; bextra [valdecoxib]; hydrocod polst-cpm polst er; lipitor [atorvastatin]; mevacor [lovastatin]; plavix  [clopidogrel bisulfate]; zocor [simvastatin]; doxycycline; metoprolol; plavix [clopidogrel bisulfate]; codeine; hydrocod polst-cpm polst er; and lisinopril. Family History: Patient family history  includes AAA (abdominal aortic aneurysm) in her brother; Breast cancer in her sister; Colon cancer in her paternal grandmother; Diabetes in her brother and sister; Heart attack in her brother, father, mother, and sister; Heart disease in her brother, brother, father, mother, and sister; Hyperlipidemia in her brother and sister; Hypertension in her brother and sister; Liver cancer in her sister. Social History:  Patient  reports that she quit smoking about 23 years ago. Her smoking use included cigarettes. She smoked 0.00 packs per day for 37.00 years. She has never used smokeless tobacco. She reports current alcohol use of about 6.0 - 10.0 standard drinks of alcohol per week. She reports that she does not use drugs.  Review of Systems: Constitutional: Negative for fever malaise or anorexia Cardiovascular: negative for chest pain Respiratory: negative for SOB or persistent cough Gastrointestinal: negative for abdominal pain     [id number] physician/qualified health professional telephone evaluation 5 to 10 minutes [id number] physician/qualified help functional [place]  evaluation for 11 to 20 minutes [id number] physician/qualify he will professional telephone evaluation for 21 to 30 minutes

## 2019-08-11 LAB — NOVEL CORONAVIRUS, NAA: SARS-CoV-2, NAA: NOT DETECTED

## 2019-08-15 DIAGNOSIS — H5213 Myopia, bilateral: Secondary | ICD-10-CM | POA: Diagnosis not present

## 2019-08-15 DIAGNOSIS — H25041 Posterior subcapsular polar age-related cataract, right eye: Secondary | ICD-10-CM | POA: Diagnosis not present

## 2019-08-20 ENCOUNTER — Other Ambulatory Visit: Payer: Self-pay

## 2019-08-20 MED ORDER — VASCEPA 0.5 G PO CAPS: 4.0000 | ORAL_CAPSULE | Freq: Two times a day (BID) | ORAL | 6 refills | Status: DC

## 2019-08-21 ENCOUNTER — Other Ambulatory Visit: Payer: Self-pay | Admitting: Vascular Surgery

## 2019-08-21 DIAGNOSIS — I714 Abdominal aortic aneurysm, without rupture, unspecified: Secondary | ICD-10-CM

## 2019-08-29 ENCOUNTER — Other Ambulatory Visit: Payer: Self-pay | Admitting: Cardiology

## 2019-09-03 ENCOUNTER — Ambulatory Visit
Admission: RE | Admit: 2019-09-03 | Discharge: 2019-09-03 | Disposition: A | Discharge status: Home / Self Care | Payer: Medicare Other | Source: Ambulatory Visit | Attending: Family Medicine | Admitting: Family Medicine

## 2019-09-03 ENCOUNTER — Other Ambulatory Visit: Payer: Self-pay

## 2019-09-03 DIAGNOSIS — Z1231 Encounter for screening mammogram for malignant neoplasm of breast: Secondary | ICD-10-CM

## 2019-09-13 ENCOUNTER — Encounter: Payer: Self-pay | Admitting: Gastroenterology

## 2019-09-13 ENCOUNTER — Telehealth: Payer: Self-pay | Admitting: Gastroenterology

## 2019-09-13 NOTE — Telephone Encounter (Signed)
Patient advised that she will need an OV to discuss.  She is scheduled for 10/18/19

## 2019-09-14 DIAGNOSIS — L570 Actinic keratosis: Secondary | ICD-10-CM | POA: Diagnosis not present

## 2019-09-18 ENCOUNTER — Ambulatory Visit
Admission: RE | Admit: 2019-09-18 | Discharge: 2019-09-18 | Disposition: A | Discharge status: Home / Self Care | Payer: Medicare Other | Source: Ambulatory Visit | Attending: Vascular Surgery | Admitting: Vascular Surgery

## 2019-09-18 DIAGNOSIS — I714 Abdominal aortic aneurysm, without rupture, unspecified: Secondary | ICD-10-CM

## 2019-09-18 DIAGNOSIS — I745 Embolism and thrombosis of iliac artery: Secondary | ICD-10-CM | POA: Diagnosis not present

## 2019-09-18 MED ORDER — IOPAMIDOL (ISOVUE-370) INJECTION 76%
75.0000 mL | Freq: Once | INTRAVENOUS | Status: AC | PRN
  Administered 2019-09-18: 75 mL via INTRAVENOUS

## 2019-09-19 DIAGNOSIS — H35371 Puckering of macula, right eye: Secondary | ICD-10-CM | POA: Diagnosis not present

## 2019-09-19 DIAGNOSIS — H25041 Posterior subcapsular polar age-related cataract, right eye: Secondary | ICD-10-CM | POA: Diagnosis not present

## 2019-09-25 ENCOUNTER — Other Ambulatory Visit: Payer: Self-pay

## 2019-09-25 ENCOUNTER — Ambulatory Visit: Payer: Medicare Other | Admitting: Vascular Surgery

## 2019-09-25 ENCOUNTER — Encounter: Payer: Self-pay | Admitting: Vascular Surgery

## 2019-09-25 VITALS — BP 122/62 | HR 67 | Temp 97.6°F | Resp 20 | Ht 61.0 in | Wt 160.9 lb

## 2019-09-25 DIAGNOSIS — Z95828 Presence of other vascular implants and grafts: Secondary | ICD-10-CM

## 2019-09-25 DIAGNOSIS — I714 Abdominal aortic aneurysm, without rupture, unspecified: Secondary | ICD-10-CM

## 2019-09-25 NOTE — Progress Notes (Signed)
[facility] [place]  Patient name: Megan [name] MRN: [id number] DOB: April 02, 1946 Sex: female  REASON FOR VISIT: Follow-up stent graft repair of abdominal aortic aneurysm  HPI: Megan [name] is a 73 y.o. female here today for follow-up.  She underwent stent graft repair abdominal aortic aneurysm in 2009.  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  She has done well and is here today for follow-up.  Her last imaging study CT scan was just over 2 years ago.  He has no claudication symptoms.  She does have significant degenerative disc disease with lumbar spine  Past Medical History:  Diagnosis Date  . AAA (abdominal aortic aneurysm) ([place])    a. s/p stent grafting in 2009.  . Adenomatous colon polyp 05/2001  . Adenomatous duodenal polyp   . Allergy   . Anginal pain ([place])   . COPD (chronic obstructive pulmonary disease) ([place])   . Coronary artery disease    a. s/p CABG in 1995;  b. 05/2013 Neg MV, EF 82%;  c. 06/2013 Cath: LM nl, LAD 40-50p, LCX nl, RCA 80p/185m, VG->RCA->PDA->PLSA min irregs, LIMA->LAD atretic, vigorous LV fxn.  . Diabetes mellitus without complication (HCC)    history of, resolved. 2017  . Diverticulosis   . Eczema, dyshidrotic   . Eczema, dyshidrotic 1986   Dr. Mathis Fare  . Fibromyalgia   . GERD (gastroesophageal reflux disease)   . Hyperlipidemia   . Hypertension   . Migraine   . Myocardial infarction ([place])   . Osteoarthritis   . Peripheral arterial disease (H[facility])    left leg diagnosed by Dr Mare Ferrari  . Renal artery stenosis ([place])    a. 2008 s/p PTA    Family History  Problem Relation Age of Onset  . Heart disease Mother   . Heart attack Mother   . Heart disease Father        before age 57  . Heart attack Father   . Heart disease Sister        before age 81  . Diabetes Sister   . Hyperlipidemia Sister   . Hypertension Sister   . Heart  attack Sister   . Liver cancer Sister   . Breast cancer Sister   . Heart disease Brother   . Diabetes Brother   . Hyperlipidemia Brother   . Hypertension Brother   . Heart attack Brother   . AAA (abdominal aortic aneurysm) Brother   . Colon cancer Paternal Grandmother   . Heart disease Brother   . Pancreatic cancer Neg Hx   . Rectal cancer Neg Hx   . Stomach cancer Neg Hx   . Esophageal cancer Neg Hx     SOCIAL HISTORY: Social History   Tobacco Use  . Smoking status: Former Smoker    Packs/day: 0.00    Years: 37.00    Pack years: 0.00    Types: Cigarettes    Quit date: 12/07/1995    Years since quitting: 23.8  . Smokeless tobacco: Never Used  Substance Use Topics  . Alcohol use: Yes    Alcohol/week: 6.0 - 10.0 standard drinks    Types: 6 - 10 Glasses of wine per week    Comment: 2-3 glasses daily    Allergies  Allergen Reactions  . Anticoagulant Compound Itching and Other (See Comments)    Hands and feet tingle and itch. Hands and feet tingle and itch.  . Morphine And Related  Nausea Only and Itching  . Bextra [Valdecoxib] Other (See Comments)    Increased lft's Increased lft's  . Hydrocod Polst-Cpm Polst Er Rash  . Lipitor [Atorvastatin] Other (See Comments)    Leg weakness Leg weakness  . Mevacor [Lovastatin] Other (See Comments)    Muscle weakness Muscle weakness  . Plavix  [Clopidogrel Bisulfate] Itching and Other (See Comments)    Hands and feet tingle and itch.  . Zocor [Simvastatin] Other (See Comments)    Bones hurt Bones hurt   . Doxycycline Diarrhea    N/v/d  . Metoprolol Other (See Comments)    Hair loss  . Plavix [Clopidogrel Bisulfate] Other (See Comments)    Itching of hands and feet  . Codeine Itching and Rash    Hyper-active  . Hydrocod Polst-Cpm Polst Er Rash  . Lisinopril Cough    Current Outpatient Medications  Medication Sig Dispense Refill  . Accu-Chek FastClix Lancets MISC Check blood sugar once daily PRN 100 each 1  .  albuterol (PROVENTIL HFA;VENTOLIN HFA) 108 (90 Base) MCG/ACT inhaler Inhale 2 puffs into the lungs every 4 (four) hours as needed for wheezing or shortness of breath. 1 Inhaler 2  . ALPRAZolam (XANAX) 0.25 MG tablet TAKE 1 TABLET BY MOUTH AT BEDTIME AS NEEDED 90 tablet 1  . amLODipine (NORVASC) 5 MG tablet Take 1 tablet (5 mg total) by mouth daily. 90 tablet 3  . aspirin 325 MG tablet Take 325 mg by mouth daily.    [name] atorvastatin (LIPITOR) 80 MG tablet Take 1 tablet (80 mg total) by mouth daily. 90 tablet 3  . augmented betamethasone dipropionate (DIPROLENE-AF) 0.05 % cream Apply 1 application topically daily as needed. For hands  1  . B Complex-Biotin-FA (B-100 COMPLEX PO) B-100 Complex    . ciclopirox (LOPROX) 0.77 % cream Apply 1 application topically daily as needed (hands).     . clobetasol cream (TEMOVATE) AB-123456789 % Apply 1 application topically 2 (two) times daily as needed (hands).     . CORDRAN 4 MCG/SQCM TAPE as needed.    . cycloSPORINE (RESTASIS) 0.05 % ophthalmic emulsion Place 1 drop into both eyes at bedtime.    . gabapentin (NEURONTIN) 300 MG capsule Take 1-2 capsules (300-600 mg total) by mouth 3 (three) times daily. 540 capsule 4  . glucose blood (ACCU-CHEK SMARTVIEW) test strip Check blood sugar once daily PRN 100 each 1  . hydrALAZINE (APRESOLINE) 10 MG tablet TAKE 1 TABLET BY MOUTH THREE TIMES A DAY 270 tablet 2  . Icosapent Ethyl (VASCEPA) 0.5 g CAPS Take 4 capsules by mouth 2 (two) times daily. 240 capsule 6  . losartan-hydrochlorothiazide (HYZAAR) 100-25 MG tablet Take 1 tablet by mouth daily. 90 tablet 3  . nitroGLYCERIN (NITROSTAT) 0.4 MG SL tablet Place 1 tablet (0.4 mg total) under the tongue every 5 (five) minutes as needed for chest pain. 25 tablet PRN  . Omega-3 Fatty Acids (FISH OIL) 1000 MG CAPS 4,000 mg. Taking 4000 mg daily 100 capsule 6  . omeprazole (PRILOSEC) 40 MG capsule TAKE 1 CAPSULE BY MOUTH TWICE A DAY 60 capsule 0  . potassium chloride (K-DUR) 10 MEQ  tablet Take 2 tablets (20 mEq total) by mouth 2 (two) times daily. 360 tablet 3  . tretinoin (RETIN-A) 0.1 % cream Apply 1 application topically every other day.    . triamcinolone cream (KENALOG) 0.1 % APPLY ON THE SKIN AS DIRECTED TWICE DAILY X 2 WEEKS    . Vitamin D, Ergocalciferol, 2000 units CAPS  Take 1 capsule by mouth daily.     [name] torsemide (DEMADEX) 20 MG tablet Take 1 tablet (20 mg total) by mouth daily as needed. 30 tablet 6   No current facility-administered medications for this visit.     REVIEW OF SYSTEMS:  [X]  denotes positive finding, [ ]  denotes negative finding Cardiac  Comments:  Chest pain or chest pressure:    Shortness of breath upon exertion:    Short of breath when lying flat:    Irregular heart rhythm:        Vascular    Pain in calf, thigh, or hip brought on by ambulation:    Pain in feet at night that wakes you up from your sleep:     Blood clot in your veins:    Leg swelling:           PHYSICAL EXAM: Vitals:   09/25/19 1125  BP: 122/62  Pulse: 67  Resp: 20  Temp: 97.6 F (36.4 C)  SpO2: 98%  Weight: 73 kg  Height: 5\' 1"  (1.549 m)    GENERAL: The patient is a well-nourished female, in no acute distress. The vital signs are documented above. CARDIOVASCULAR: 2+ radial and 2+ dorsalis pedis pulses PULMONARY: There is good air exchange  MUSCULOSKELETAL: There are no major deformities or cyanosis. NEUROLOGIC: No focal weakness or paresthesias are detected. SKIN: There are no ulcers or rashes noted. PSYCHIATRIC: The patient has a normal affect.  DATA:  CT scan from 09/18/2019 was reviewed with the patient.  This shows stable aneurysm position without evidence of endoleak.  Her maximal diameter is slightly less at 4.2 cm from 4.4 cm.  Iliac vessels are widely patent.  The radiologist does remark on a area of cavitary lesion at 1.5 cm in her left lateral lung chest wall.  And his reported reports that the mucosal areas may be somewhat more thickened  than study from the past.  There is recommendation to watch this on follow-up imaging.  MEDICAL ISSUES: Stable from the standpoint of her stent graft repair.  We will see her again in 1 year with repeat CT scan of her abdomen pelvis and also chest CT for evaluation of this pleural-based lesion.    Rosetta Posner, MD FACS [facility] Tel [phone] Pager [phone]

## 2019-10-12 DIAGNOSIS — M545 Low back pain: Secondary | ICD-10-CM | POA: Diagnosis not present

## 2019-10-12 DIAGNOSIS — M519 Unspecified thoracic, thoracolumbar and lumbosacral intervertebral disc disorder: Secondary | ICD-10-CM | POA: Diagnosis not present

## 2019-10-12 DIAGNOSIS — M419 Scoliosis, unspecified: Secondary | ICD-10-CM | POA: Diagnosis not present

## 2019-10-12 DIAGNOSIS — M5136 Other intervertebral disc degeneration, lumbar region: Secondary | ICD-10-CM | POA: Diagnosis not present

## 2019-10-12 DIAGNOSIS — M7061 Trochanteric bursitis, right hip: Secondary | ICD-10-CM | POA: Diagnosis not present

## 2019-10-15 ENCOUNTER — Encounter: Payer: Self-pay | Admitting: Family Medicine

## 2019-10-17 ENCOUNTER — Other Ambulatory Visit: Payer: Self-pay | Admitting: Cardiology

## 2019-10-17 ENCOUNTER — Other Ambulatory Visit: Payer: Self-pay

## 2019-10-17 DIAGNOSIS — F419 Anxiety disorder, unspecified: Secondary | ICD-10-CM

## 2019-10-17 MED ORDER — ALPRAZOLAM 0.25 MG PO TABS: 0.2500 mg | ORAL_TABLET | Freq: Every evening | ORAL | 1 refills | Status: DC | PRN

## 2019-10-17 NOTE — Telephone Encounter (Signed)
Please call pt and schedule after sometime after Nov 19th can use Same day slot per Dr. Jonni Sanger.

## 2019-10-18 ENCOUNTER — Ambulatory Visit: Payer: Medicare Other | Admitting: Gastroenterology

## 2019-10-18 ENCOUNTER — Encounter: Payer: Self-pay | Admitting: Gastroenterology

## 2019-10-18 VITALS — BP 142/74 | HR 73 | Temp 98.1°F | Ht 61.0 in | Wt 160.0 lb

## 2019-10-18 DIAGNOSIS — Z1159 Encounter for screening for other viral diseases: Secondary | ICD-10-CM

## 2019-10-18 DIAGNOSIS — D132 Benign neoplasm of duodenum: Secondary | ICD-10-CM | POA: Diagnosis not present

## 2019-10-18 DIAGNOSIS — K219 Gastro-esophageal reflux disease without esophagitis: Secondary | ICD-10-CM

## 2019-10-18 DIAGNOSIS — Z86010 Personal history of colonic polyps: Secondary | ICD-10-CM | POA: Diagnosis not present

## 2019-10-18 MED ORDER — SUPREP BOWEL PREP KIT 17.5-3.13-1.6 GM/177ML PO SOLN: 1.0000 | ORAL | 0 refills | Status: DC

## 2019-10-18 NOTE — Patient Instructions (Signed)
You have been scheduled for an endoscopy and colonoscopy. Please follow the written instructions given to you at your visit today. Please pick up your prep supplies at the pharmacy within the next 1-3 days. If you use inhalers (even only as needed), please bring them with you on the day of your procedure. Your physician has requested that you go to www.startemmi.com and enter the access code given to you at your visit today. This web site gives a general overview about your procedure. However, you should still follow specific instructions given to you by our office regarding your preparation for the procedure.  It was a pleasure to see you today!  Dr Fuller Plan

## 2019-10-18 NOTE — Progress Notes (Signed)
    History of Present Illness: This is a 73 year old female with GERD history of duodenal adenoma personal history of colon polyps and family history of colon cancer. Her paternal grandmother and a paternal cousin both had colon cancer.  She has a remote history of adenomatous colon polyps.  Duodenal adenoma was noted to be completely removed on last endoscopy as below.  Chronic GERD is well controlled when she takes her PPI daily or twice daily however she notes a frequent sore throat when she tries to reduce PPI frequency to every other day.  No other gastrointestinal complaints. Denies weight loss, abdominal pain, constipation, diarrhea, change in stool caliber, melena, hematochezia, nausea, vomiting, dysphagia, chest pain.   Colonoscopy 09/2014  1. Sessile polyp in the rectum; polypectomy performed with a cold snare - hyperplastic 2. Severe diverticulosis in the left colon 3. Mild diverticulosis in the transverse colon and ascending colon  EGD 09/2014 1. Nodules in the duodenal bulb; multiple sampling biopsies performed - unremarkable 2. Gastritis in the gastric fundus and gastric body; multiple biopsies performed - unremarkable 3. Small hiatal hernia  Current Medications, Allergies, Past Medical History, Past Surgical History, Family History and Social History were reviewed in [employer] record.   Physical Exam: General: Well developed, well nourished, no acute distress Head: Normocephalic and atraumatic Eyes:  sclerae anicteric, EOMI Ears: Normal auditory acuity Mouth: No deformity or lesions Lungs: Clear throughout to auscultation Heart: Regular rate and rhythm; no murmurs, rubs or bruits Abdomen: Soft, non tender and non distended. No masses, hepatosplenomegaly or hernias noted. Normal Bowel sounds Rectal: Deferred to colonoscopy Musculoskeletal: Symmetrical with no gross deformities  Pulses:  Normal pulses noted Extremities: No clubbing, cyanosis, edema  or deformities noted Neurological: Alert oriented x 4, grossly nonfocal Psychological:  Alert and cooperative. Normal mood and affect   Assessment and Recommendations:  1.  Family history of colon cancer in 2 second-degree relatives.  Remote personal history of adenomatous colon polyps.  Schedule colonoscopy. The risks (including bleeding, perforation, infection, missed lesions, medication reactions and possible hospitalization or surgery if complications occur), benefits, and alternatives to colonoscopy with possible biopsy and possible polypectomy were discussed with the patient and they consent to proceed.   2.  Chronic GERD.  Intermittent sore throat suggestive of LPR.  Closely follow antireflux measures.  Advised to take omeprazole 40 mg at least daily and if symptoms are not well controlled resume twice daily.  Rule out Barrett's, esophagitis.  Schedule EGD. The risks (including bleeding, perforation, infection, missed lesions, medication reactions and possible hospitalization or surgery if complications occur), benefits, and alternatives to endoscopy with possible biopsy and possible dilation were discussed with the patient and they consent to proceed.   3.  History of a duodenal adenoma.  EGD as above.

## 2019-10-19 ENCOUNTER — Encounter: Payer: Self-pay | Admitting: Gastroenterology

## 2019-10-19 ENCOUNTER — Other Ambulatory Visit: Payer: Self-pay

## 2019-10-19 MED ORDER — OMEPRAZOLE 40 MG PO CPDR: DELAYED_RELEASE_CAPSULE | ORAL | 11 refills | Status: DC

## 2019-10-19 NOTE — Telephone Encounter (Signed)
I spoke with Megan [name] and sent in her refill for her omeprazole at BID usage. She is aware to do it as Dr Fuller Plan instructed at her visit yesterday, one to two daily depending on her need.

## 2019-10-22 ENCOUNTER — Ambulatory Visit (INDEPENDENT_AMBULATORY_CARE_PROVIDER_SITE_OTHER): Payer: Medicare Other | Admitting: Family Medicine

## 2019-10-22 ENCOUNTER — Encounter: Payer: Self-pay | Admitting: Family Medicine

## 2019-10-22 ENCOUNTER — Other Ambulatory Visit: Payer: Self-pay

## 2019-10-22 VITALS — BP 130/78 | HR 71 | Temp 98.0°F | Ht 61.0 in | Wt 161.0 lb

## 2019-10-22 DIAGNOSIS — M8580 Other specified disorders of bone density and structure, unspecified site: Secondary | ICD-10-CM | POA: Diagnosis not present

## 2019-10-22 DIAGNOSIS — E2839 Other primary ovarian failure: Secondary | ICD-10-CM

## 2019-10-22 NOTE — Progress Notes (Signed)
Subjective  CC:  Chief Complaint  Patient presents with  . Osteoporosis    HPI: Megan [name] is a 73 y.o. female who presents to the office today to address the problems listed above in the chief complaint.  73 yo with h/o osteopenia but last checked in 2004 since pt had adverse reactions to both fosamax and another biphosphonate. She had nausea and GI intolerance. Thus she has deferred further screenig. However, recent appt with ortho discussed "compressed discs " in back and she is now ready for further evaluation regarding possible osteoporosis. I don't have ortho's notes; it is unclear if the xrays showed thinning of the vertebrae or disc compressions or old vertebral fractures.  Pt has some back pain. No h/o hip or wrist fracture.   Assessment  1. Osteopenia, unspecified location   2. Hypoestrogenism      Plan   osteopenia:  Will reevaluated with dexa scan. Then will make recommendations based upon results. Education given.   Follow up: No follow-ups on file.  01/30/2020  Orders Placed This Encounter  Procedures  . Dexa BC   No orders of the defined types were placed in this encounter.     I reviewed the patients updated PMH, FH, and SocHx.    Patient Active Problem List   Diagnosis Date Noted  . COPD with chronic bronchitis ([place]) 09/28/2018    Priority: High  . Essential hypertension 07/10/2018    Priority: High  . IFG (impaired fasting glucose) 11/10/2017    Priority: High  . 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 Coronary artery disease     Priority: High  . Peripheral vascular disease with claudication ([place])     Priority: High  . 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 History of abdominal aortic aneurysm repair 05/18/2011    Priority: High  . Mixed hyperlipidemia     Priority: High  . DJD (degenerative joint disease), lumbosacral 11/10/2017    Priority: Medium  .  Fibromyalgia 11/10/2017    Priority: Medium  . GERD (gastroesophageal reflux disease) 11/10/2017    Priority: Medium  . Fatty liver 11/10/2017    Priority: Medium  . 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    Priority: Medium  . History of renal artery stenosis 05/18/2011    Priority: Medium  . Dyshidrotic eczema 11/10/2017    Priority: Low  . Osteopenia 11/10/2017    Priority: Low  . Does use hearing aid 11/10/2017    Priority: Low  . Plantar fasciitis of left foot 05/30/2019  . Degeneration of lumbar intervertebral disc 02/22/2019  . Right rotator cuff tear 09/27/2018  . Complete rotator cuff tear 09/27/2018   Current Meds  Medication Sig  . Accu-Chek FastClix Lancets MISC Check blood sugar once daily PRN  . albuterol (PROVENTIL HFA;VENTOLIN HFA) 108 (90 Base) MCG/ACT inhaler Inhale 2 puffs into the lungs every 4 (four) hours as needed for wheezing or shortness of breath.  . ALPRAZolam (XANAX) 0.25 MG tablet Take 1 tablet (0.25 mg total) by mouth at bedtime as needed.  [name] amLODipine (NORVASC) 5 MG tablet Take 1 tablet (5 mg total) by mouth daily.  [name] aspirin 325 MG tablet Take 325 mg by mouth daily.  [name] atorvastatin (LIPITOR) 80 MG tablet Take 1 tablet (80 mg total) by mouth daily.  [name] augmented betamethasone dipropionate (DIPROLENE-AF) 0.05 % cream Apply 1 application topically daily as needed. For hands  . B Complex-Biotin-FA (  B-100 COMPLEX PO) B-100 Complex  . ciclopirox (LOPROX) 0.77 % cream Apply 1 application topically daily as needed (hands).   . clobetasol cream (TEMOVATE) 4.31 % Apply 1 application topically 2 (two) times daily as needed (hands).   . CORDRAN 4 MCG/SQCM TAPE as needed.  . cycloSPORINE (RESTASIS) 0.05 % ophthalmic emulsion Place 1 drop into both eyes at bedtime.  . gabapentin (NEURONTIN) 300 MG capsule Take 1-2 capsules (300-600 mg total) by mouth 3 (three) times daily.  [name] glucose blood (ACCU-CHEK SMARTVIEW) test strip Check blood sugar once daily PRN  . hydrALAZINE  (APRESOLINE) 10 MG tablet TAKE 1 TABLET BY MOUTH THREE TIMES A DAY  . Icosapent Ethyl (VASCEPA) 0.5 g CAPS Take 4 capsules by mouth 2 (two) times daily.  [name] losartan-hydrochlorothiazide (HYZAAR) 100-25 MG tablet Take 1 tablet by mouth daily.  . nitroGLYCERIN (NITROSTAT) 0.4 MG SL tablet Place 1 tablet (0.4 mg total) under the tongue every 5 (five) minutes as needed for chest pain.  . Omega-3 Fatty Acids (FISH OIL) 1000 MG CAPS 4,000 mg. Taking 4000 mg daily  . omeprazole (PRILOSEC) 40 MG capsule TAKE 1 CAPSULE BY MOUTH TWICE A DAY  . potassium chloride (K-DUR) 10 MEQ tablet Take 2 tablets (20 mEq total) by mouth 2 (two) times daily.  Manus Gunning BOWEL PREP KIT 17.5-3.13-1.6 GM/177ML SOLN Take 1 kit by mouth as directed.  . tretinoin (RETIN-A) 0.1 % cream Apply 1 application topically every other day.  . triamcinolone cream (KENALOG) 0.1 % APPLY ON THE SKIN AS DIRECTED TWICE DAILY X 2 WEEKS  . Vitamin D, Ergocalciferol, 2000 units CAPS Take 1 capsule by mouth daily.     Allergies: Patient is allergic to anticoagulant compound; morphine and related; bextra [valdecoxib]; hydrocod polst-cpm polst er; lipitor [atorvastatin]; mevacor [lovastatin]; plavix  [clopidogrel bisulfate]; zocor [simvastatin]; doxycycline; metoprolol; plavix [clopidogrel bisulfate]; codeine; hydrocod polst-cpm polst er; and lisinopril. Family History: Patient family history includes AAA (abdominal aortic aneurysm) in her brother; Breast cancer in her sister; Colon cancer in her cousin and paternal grandmother; Diabetes in her brother and sister; Heart attack in her brother, father, mother, and sister; Heart disease in her brother, brother, father, mother, and sister; Hyperlipidemia in her brother and sister; Hypertension in her brother and sister; Liver cancer in her sister. Social History:  Patient  reports that she quit smoking about 23 years ago. Her smoking use included cigarettes. She smoked 0.00 packs per day for 37.00 years.  She has never used smokeless tobacco. She reports current alcohol use of about 6.0 - 10.0 standard drinks of alcohol per week. She reports that she does not use drugs.  Review of Systems: Constitutional: Negative for fever malaise or anorexia Cardiovascular: negative for chest pain Respiratory: negative for SOB or persistent cough Gastrointestinal: negative for abdominal pain  Objective  Vitals: BP 130/78 (BP Location: Left Arm, Patient Position: Sitting, Cuff Size: Normal)   Pulse 71   Temp 98 F (36.7 C) (Temporal)   Ht _0  (1.549 m)   Wt 161 lb (73 kg)   SpO2 96%   BMI 30.42 kg/m  General: no acute distress , A&Ox3      Commons side effects, risks, benefits, and alternatives for medications and treatment plan prescribed today were discussed, and the patient expressed understanding of the given instructions. Patient is instructed to call or message via MyChart if he/she has any questions or concerns regarding our treatment plan. No barriers to understanding were identified. We discussed Red Flag symptoms and  signs in detail. Patient expressed understanding regarding what to do in case of urgent or emergency type symptoms.   Medication list was reconciled, printed and provided to the patient in AVS. Patient instructions and summary information was reviewed with the patient as documented in the AVS. This note was prepared with assistance of Dragon voice recognition software. Occasional wrong-word or sound-a-like substitutions may have occurred due to the inherent limitations of voice recognition software

## 2019-10-25 DIAGNOSIS — M5136 Other intervertebral disc degeneration, lumbar region: Secondary | ICD-10-CM | POA: Diagnosis not present

## 2019-10-31 ENCOUNTER — Ambulatory Visit (INDEPENDENT_AMBULATORY_CARE_PROVIDER_SITE_OTHER): Payer: Medicare Other

## 2019-10-31 ENCOUNTER — Other Ambulatory Visit: Payer: Self-pay | Admitting: Gastroenterology

## 2019-10-31 DIAGNOSIS — Z1159 Encounter for screening for other viral diseases: Secondary | ICD-10-CM

## 2019-11-02 LAB — SARS CORONAVIRUS 2 (TAT 6-24 HRS): SARS Coronavirus 2: NEGATIVE

## 2019-11-05 ENCOUNTER — Other Ambulatory Visit: Payer: Self-pay

## 2019-11-05 ENCOUNTER — Ambulatory Visit (AMBULATORY_SURGERY_CENTER): Payer: Medicare Other | Admitting: Gastroenterology

## 2019-11-05 ENCOUNTER — Encounter: Payer: Self-pay | Admitting: Gastroenterology

## 2019-11-05 VITALS — BP 114/52 | HR 62 | Temp 97.9°F | Resp 15 | Ht 61.0 in | Wt 160.0 lb

## 2019-11-05 DIAGNOSIS — K2970 Gastritis, unspecified, without bleeding: Secondary | ICD-10-CM | POA: Diagnosis not present

## 2019-11-05 DIAGNOSIS — D125 Benign neoplasm of sigmoid colon: Secondary | ICD-10-CM

## 2019-11-05 DIAGNOSIS — Z800 Family history of malignant neoplasm of digestive organs: Secondary | ICD-10-CM

## 2019-11-05 DIAGNOSIS — Z86010 Personal history of colonic polyps: Secondary | ICD-10-CM | POA: Diagnosis not present

## 2019-11-05 DIAGNOSIS — K635 Polyp of colon: Secondary | ICD-10-CM

## 2019-11-05 DIAGNOSIS — K449 Diaphragmatic hernia without obstruction or gangrene: Secondary | ICD-10-CM | POA: Diagnosis not present

## 2019-11-05 DIAGNOSIS — K3189 Other diseases of stomach and duodenum: Secondary | ICD-10-CM | POA: Diagnosis not present

## 2019-11-05 DIAGNOSIS — K219 Gastro-esophageal reflux disease without esophagitis: Secondary | ICD-10-CM

## 2019-11-05 DIAGNOSIS — K317 Polyp of stomach and duodenum: Secondary | ICD-10-CM | POA: Diagnosis not present

## 2019-11-05 MED ORDER — SODIUM CHLORIDE 0.9 % IV SOLN: 500.0000 mL | Freq: Once | INTRAVENOUS | Status: DC

## 2019-11-05 NOTE — Op Note (Signed)
[place] Patient Name: Megan [name] Procedure Date: 11/05/2019 2:16 PM MRN: [id number] Endoscopist: Ladene Artist , MD Age: 73 Referring MD:  Date of Birth: 1946/05/25 Gender: Female Account #: [id number] Procedure:                Upper GI endoscopy Indications:              Gastroesophageal reflux disease. History of                            duodenal adenoma. Medicines:                Monitored Anesthesia Care Procedure:                Pre-Anesthesia Assessment:                           - Prior to the procedure, a History and Physical                            was performed, and patient medications and                            allergies were reviewed. The patient's tolerance of                            previous anesthesia was also reviewed. The risks                            and benefits of the procedure and the sedation                            options and risks were discussed with the patient.                            All questions were answered, and informed consent                            was obtained. Prior Anticoagulants: The patient has                            taken no previous anticoagulant or antiplatelet                            agents. ASA Grade Assessment: II - A patient with                            mild systemic disease. After reviewing the risks                            and benefits, the patient was deemed in                            satisfactory condition to undergo the procedure.  After obtaining informed consent, the endoscope was                            passed under direct vision. Throughout the                            procedure, the patient's blood pressure, pulse, and                            oxygen saturations were monitored continuously. The                            Endoscope was introduced through the mouth, and                            advanced to the second part of duodenum. The  upper                            GI endoscopy was accomplished without difficulty.                            The patient tolerated the procedure well. Scope In: Scope Out: Findings:                 The examined esophagus was normal.                           Diffuse moderate inflammation characterized by                            erythema, friability and granularity was found in                            the gastric fundus, in the gastric body and in the                            gastric antrum. Biopsies were taken with a cold                            forceps for histology.                           Multiple small sessile polyps with no bleeding and                            no stigmata of recent bleeding were found in the                            gastric fundus and in the gastric body. Typical                            appearance of benign fundic gland polyps so not  biopsied.                           A small hiatal hernia was present.                           The exam of the stomach was otherwise normal.                           Two 3 to 4 mm sessile polyps with no bleeding were                            found in the duodenal bulb and in the second                            portion of the duodenum. The polyp in the bulb was                            at the site of the prior adenoma. The polyps were                            removed with a cold biopsy forceps. Resection and                            retrieval were complete. Complications:            No immediate complications. Estimated Blood Loss:     Estimated blood loss was minimal. Impression:               - Normal esophagus.                           - Gastritis. Biopsied.                           - Multiple gastric polyps, typical appearance for                            benign fundic gland polyps.                           - Small hiatal hernia.                           - Two  duodenal polyps. Resected and retrieved. Recommendation:           - Patient has a contact number available for                            emergencies. The signs and symptoms of potential                            delayed complications were discussed with the                            patient. Return to normal activities tomorrow.  Written discharge instructions were provided to the                            patient.                           - Resume previous diet.                           - Continue present medications.                           - Await pathology results. Ladene Artist, MD 11/05/2019 3:18:15 PM This report has been signed electronically.

## 2019-11-05 NOTE — Patient Instructions (Signed)
Information on polyps, diverticulosis and gastritis given to you today.  Await pathology results.  YOU HAD AN ENDOSCOPIC PROCEDURE TODAY AT [facility]:   Refer to the procedure report that was given to you for any specific questions about what was found during the examination.  If the procedure report does not answer your questions, please call your gastroenterologist to clarify.  If you requested that your care partner not be given the details of your procedure findings, then the procedure report has been included in a sealed envelope for you to review at your convenience later.  YOU SHOULD EXPECT: Some feelings of bloating in the abdomen. Passage of more gas than usual.  Walking can help get rid of the air that was put into your GI tract during the procedure and reduce the bloating. If you had a lower endoscopy (such as a colonoscopy or flexible sigmoidoscopy) you may notice spotting of blood in your stool or on the toilet paper. If you underwent a bowel prep for your procedure, you may not have a normal bowel movement for a few days.  Please Note:  You might notice some irritation and congestion in your nose or some drainage.  This is from the oxygen used during your procedure.  There is no need for concern and it should clear up in a day or so.  SYMPTOMS TO REPORT IMMEDIATELY:   Following lower endoscopy (colonoscopy or flexible sigmoidoscopy):  Excessive amounts of blood in the stool  Significant tenderness or worsening of abdominal pains  Swelling of the abdomen that is new, acute  Fever of 100F or higher   Following upper endoscopy (EGD)  Vomiting of blood or coffee ground material  New chest pain or pain under the shoulder blades  Painful or persistently difficult swallowing  New shortness of breath  Fever of 100F or higher  Black, tarry-looking stools  For urgent or emergent issues, a gastroenterologist can be reached at any hour by calling [phone]  [phone].   DIET:  We do recommend a small meal at first, but then you may proceed to your regular diet.  Drink plenty of fluids but you should avoid alcoholic beverages for 24 hours.  ACTIVITY:  You should plan to take it easy for the rest of today and you should NOT DRIVE or use heavy machinery until tomorrow (because of the sedation medicines used during the test).    FOLLOW UP: Our staff will call the number listed on your records 48-72 hours following your procedure to check on you and address any questions or concerns that you may have regarding the information given to you following your procedure. If we do not reach you, we will leave a message.  We will attempt to reach you two times.  During this call, we will ask if you have developed any symptoms of COVID 19. If you develop any symptoms (ie: fever, flu-like symptoms, shortness of breath, cough etc.) before then, please call [phone].  If you test positive for Covid 19 in the 2 weeks post procedure, please call and report this information to [place].    If any biopsies were taken you will be contacted by phone or by letter within the next 1-3 weeks.  Please call us at [phone] if you have not heard about the biopsies in 3 weeks.    SIGNATURES/CONFIDENTIALITY: You and/or your care partner have signed paperwork which will be entered into your electronic medical record.  These signatures attest to the fact  that that the information above on your After Visit Summary has been reviewed and is understood.  Full responsibility of the confidentiality of this discharge information lies with you and/or your care-partner.

## 2019-11-05 NOTE — Progress Notes (Signed)
PT taken to PACU. Monitors in place. VSS. Report given to RN. 

## 2019-11-05 NOTE — Progress Notes (Signed)
Called to room to assist during endoscopic procedure.  Patient ID and intended procedure confirmed with present staff. Received instructions for my participation in the procedure from the performing physician.  

## 2019-11-05 NOTE — Progress Notes (Signed)
VS- CW Temp LC

## 2019-11-05 NOTE — Op Note (Addendum)
[place] Patient Name: Megan [name] Procedure Date: 11/05/2019 2:16 PM MRN: [id number] Endoscopist: Ladene Artist , MD Age: 73 Referring MD:  Date of Birth: 1946/09/03 Gender: Female Account #: [id number] Procedure:                Colonoscopy Indications:              Surveillance: Personal history of adenomatous                            polyps on last colonoscopy 5 years ago. Family                            history of colon cancer. Medicines:                Monitored Anesthesia Care Procedure:                Pre-Anesthesia Assessment:                           - Prior to the procedure, a History and Physical                            was performed, and patient medications and                            allergies were reviewed. The patient's tolerance of                            previous anesthesia was also reviewed. The risks                            and benefits of the procedure and the sedation                            options and risks were discussed with the patient.                            All questions were answered, and informed consent                            was obtained. Prior Anticoagulants: The patient has                            taken no previous anticoagulant or antiplatelet                            agents. ASA Grade Assessment: II - A patient with                            mild systemic disease. After reviewing the risks                            and benefits, the patient was deemed in  satisfactory condition to undergo the procedure.                           After obtaining informed consent, the colonoscope                            was passed under direct vision. Throughout the                            procedure, the patient's blood pressure, pulse, and                            oxygen saturations were monitored continuously. The                            Endoscope was introduced through the anus  and                            advanced to the the ileocecal valve. The quality of                            the bowel preparation was good. The patient                            tolerated the procedure well. The ileocecal valve                            and the rectum were photographed. The colonoscopy                            was technically difficult and complex due to                            multiple diverticula in the colon, restricted                            mobility of the colon and a tortuous sigmoid colon.                            Successful completion of the procedure was aided by                            withdrawing the scope and replacing with replacing                            with the pediatric colonoscope then withdrawing and                            replacing with the adult endoscope. Significant                            looping of the adult endoscope limited extent of  exam to the IC valve. Scope In: 2:26:25 PM Scope Out: 2:47:33 PM Scope Withdrawal Time: 0 hours 13 minutes 34 seconds  Total Procedure Duration: 0 hours 21 minutes 8 seconds  Findings:                 The perianal and digital rectal examinations were                            normal.                           Two sessile polyps were found in the rectum and                            sigmoid colon. The polyps were 5 to 6 mm in size.                            These polyps were removed with a cold snare.                            Resection complete and retrieval of sigmoid polyp,                            but not rectal polyp.                           A single small localized angiodysplastic lesion                            without bleeding was found in the ascending colon.                           Multiple medium-mouthed diverticula were found in                            the left colon. There was narrowing of the colon in                             association with the diverticular opening. There                            was evidence of diverticular spasm. There was no                            evidence of diverticular bleeding. Unable to                            advance adult colonoscope or peds colonoscope past                            the sigmoid colon.                           Multiple small-mouthed diverticula were found in  the right colon. There was no evidence of                            diverticular bleeding.                           The exam was otherwise without abnormality on                            direct and retroflexion views. Partial view of                            cecum from IC valve. Complications:            No immediate complications. Estimated blood loss:                            None. Estimated Blood Loss:     Estimated blood loss: none. Impression:               - Two 5 to 6 mm polyps in the rectum and in the                            sigmoid colon, removed with a cold snare. Resected                            and retrieved.                           - Severe diverticulosis in the left colon.                           - Moderate diverticulosis in the right colon.                           - Small ascending colon AVM.                           - The examination was otherwise normal on direct                            and retroflexion views. Recommendation:           - Patient has a contact number available for                            emergencies. The signs and symptoms of potential                            delayed complications were discussed with the                            patient. Return to normal activities tomorrow.                            Written discharge instructions were provided to the  patient.                           - High fiber diet.                           - Continue present medications.                            - Await pathology results.                           - No repeat colonoscopy due to age. Ladene Artist, MD 11/05/2019 3:08:04 PM This report has been signed electronically.

## 2019-11-06 ENCOUNTER — Encounter: Payer: Self-pay | Admitting: Family Medicine

## 2019-11-06 DIAGNOSIS — M7061 Trochanteric bursitis, right hip: Secondary | ICD-10-CM | POA: Diagnosis not present

## 2019-11-06 DIAGNOSIS — M419 Scoliosis, unspecified: Secondary | ICD-10-CM | POA: Diagnosis not present

## 2019-11-06 DIAGNOSIS — M545 Low back pain: Secondary | ICD-10-CM | POA: Diagnosis not present

## 2019-11-06 DIAGNOSIS — M797 Fibromyalgia: Secondary | ICD-10-CM | POA: Diagnosis not present

## 2019-11-06 DIAGNOSIS — M48061 Spinal stenosis, lumbar region without neurogenic claudication: Secondary | ICD-10-CM | POA: Diagnosis not present

## 2019-11-07 ENCOUNTER — Encounter: Payer: Self-pay | Admitting: Family Medicine

## 2019-11-07 ENCOUNTER — Ambulatory Visit: Payer: Medicare Other | Admitting: Physician Assistant

## 2019-11-07 ENCOUNTER — Ambulatory Visit (INDEPENDENT_AMBULATORY_CARE_PROVIDER_SITE_OTHER): Payer: Medicare Other | Admitting: Family Medicine

## 2019-11-07 ENCOUNTER — Telehealth: Payer: Self-pay | Admitting: *Deleted

## 2019-11-07 ENCOUNTER — Other Ambulatory Visit: Payer: Self-pay

## 2019-11-07 VITALS — BP 128/74 | HR 76 | Temp 98.1°F | Ht 61.0 in | Wt 161.0 lb

## 2019-11-07 DIAGNOSIS — L03211 Cellulitis of face: Secondary | ICD-10-CM | POA: Diagnosis not present

## 2019-11-07 MED ORDER — CEPHALEXIN 500 MG PO CAPS: 500.0000 mg | ORAL_CAPSULE | Freq: Two times a day (BID) | ORAL | 0 refills | Status: DC

## 2019-11-07 NOTE — Patient Instructions (Signed)
Please follow up if symptoms do not improve or as needed.   Let me know if this doesn't improve with the antibiotics, advil and time!

## 2019-11-07 NOTE — Progress Notes (Signed)
Subjective  CC:  Chief Complaint  Patient presents with   Tingling    x3 days, right cheek    HPI: Megan [name] is a 73 y.o. female who presents to the office today to address the problems listed above in the chief complaint.  Feels nodule on right cheek bone, mildly red and tender with tingling that started last night. No rash. Also some red acne like papules on the right side of her nose, cheek. Lesions sit where the edge of the facemask sits. No ear pain, eye sxs, f/c/s, st or pain. No rash. Otherwise feels well.    Assessment  1. Cellulitis, face      Plan   Cellulitis? :  Will cover for infection / cellulits with keflex. Monitor for worsening. F/u if not improving.   Follow up: Return if symptoms worsen or fail to improve.  01/30/2020  No orders of the defined types were placed in this encounter.  Meds ordered this encounter  Medications   cephALEXin (KEFLEX) 500 MG capsule    Sig: Take 1 capsule (500 mg total) by mouth 2 (two) times daily.    Dispense:  14 capsule    Refill:  0      I reviewed the patients updated PMH, FH, and SocHx.    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 05/18/2011    Priority: High   History of abdominal aortic aneurysm repair 05/18/2011    Priority: High   Mixed hyperlipidemia     Priority: High   DJD (degenerative joint disease), lumbosacral 11/10/2017    Priority: Medium   Fibromyalgia 11/10/2017    Priority: Medium   GERD (gastroesophageal reflux disease) 11/10/2017    Priority:  Medium   Fatty liver 11/10/2017    Priority: Medium   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    Priority: Medium   History of renal artery stenosis 05/18/2011    Priority: Medium   Dyshidrotic eczema 11/10/2017    Priority: Low   Osteopenia 11/10/2017    Priority: Low   Does use hearing aid 11/10/2017    Priority: Low   Plantar fasciitis of left foot 05/30/2019   Degeneration of lumbar intervertebral disc 02/22/2019   Right rotator cuff tear 09/27/2018   Complete rotator cuff tear 09/27/2018   Current Meds  Medication Sig   Accu-Chek FastClix Lancets MISC Check blood sugar once daily PRN   albuterol (PROVENTIL HFA;VENTOLIN HFA) 108 (90 Base) MCG/ACT inhaler Inhale 2 puffs into the lungs every 4 (four) hours as needed for wheezing or shortness of breath.   ALPRAZolam (XANAX) 0.25 MG tablet Take 1 tablet (0.25 mg total) by mouth at bedtime as needed.   amLODipine (NORVASC) 5 MG tablet Take 1 tablet (5 mg total) by mouth daily.   aspirin 325 MG tablet Take 325 mg by mouth daily.   atorvastatin (LIPITOR) 80 MG tablet Take 1 tablet (80 mg total) by mouth daily.   augmented betamethasone dipropionate (DIPROLENE-AF) 0.05 % cream Apply 1 application topically daily as needed.  For hands   B Complex-Biotin-FA (B-100 COMPLEX PO) B-100 Complex   ciclopirox (LOPROX) 0.77 % cream Apply 1 application topically daily as needed (hands).    clobetasol cream (TEMOVATE) [id number]-123456789 % Apply 1 application topically 2 (two) times daily as needed (hands).    CORDRAN 4 MCG/SQCM TAPE as needed.   cycloSPORINE (RESTASIS) 0.05 % ophthalmic emulsion Place 1 drop into both eyes at bedtime.   gabapentin (NEURONTIN) 300 MG capsule Take 1-2 capsules (300-600 mg total) by mouth 3 (three) times daily.   glucose blood (ACCU-CHEK SMARTVIEW) test strip Check blood sugar once daily PRN   hydrALAZINE (APRESOLINE) 10 MG tablet TAKE 1 TABLET BY MOUTH THREE TIMES A DAY   HYDROcodone-acetaminophen (NORCO/VICODIN)  5-325 MG tablet hydrocodone 5 mg-acetaminophen 325 mg tablet  TAKE 1 TABLET(S) TWICE A DAY BY ORAL ROUTE AS NEEDED FOR PAIN FOR 7 DAYS.   Icosapent Ethyl (VASCEPA) 0.5 g CAPS Take 4 capsules by mouth 2 (two) times daily.   losartan-hydrochlorothiazide (HYZAAR) 100-25 MG tablet Take 1 tablet by mouth daily.   nitroGLYCERIN (NITROSTAT) 0.4 MG SL tablet Place 1 tablet (0.4 mg total) under the tongue every 5 (five) minutes as needed for chest pain.   omeprazole (PRILOSEC) 40 MG capsule TAKE 1 CAPSULE BY MOUTH TWICE A DAY   potassium chloride (K-DUR) 10 MEQ tablet Take 2 tablets (20 mEq total) by mouth 2 (two) times daily.   tretinoin (RETIN-A) 0.1 % cream Apply 1 application topically every other day.   Vitamin D, Ergocalciferol, 2000 units CAPS Take 1 capsule by mouth daily.     Allergies: Patient is allergic to anticoagulant compound; morphine and related; bextra [valdecoxib]; hydrocod polst-cpm polst er; lipitor [atorvastatin]; mevacor [lovastatin]; plavix  [clopidogrel bisulfate]; zocor [simvastatin]; doxycycline; metoprolol; plavix [clopidogrel bisulfate]; codeine; hydrocod polst-cpm polst er; and lisinopril. Family History: Patient family history includes AAA (abdominal aortic aneurysm) in her brother; Breast cancer in her sister; Colon cancer in her cousin and paternal grandmother; Diabetes in her brother and sister; Heart attack in her brother, father, mother, and sister; Heart disease in her brother, brother, father, mother, and sister; Hyperlipidemia in her brother and sister; Hypertension in her brother and sister; Liver cancer in her sister. Social History:  Patient  reports that she quit smoking about 23 years ago. Her smoking use included cigarettes. She smoked 0.00 packs per day for 37.00 years. She has never used smokeless tobacco. She reports current alcohol use of about 6.0 - 10.0 standard drinks of alcohol per week. She reports that she does not use drugs.  Review of  Systems: Constitutional: Negative for fever malaise or anorexia Cardiovascular: negative for chest pain Respiratory: negative for SOB or persistent cough Gastrointestinal: negative for abdominal pain  Objective  Vitals: BP 128/74 (BP Location: Left Arm, Patient Position: Sitting, Cuff Size: Normal)    Pulse 76    Temp 98.1 F (36.7 C) (Temporal)    Ht 5\' 1"  (1.549 m)    Wt 161 lb (73 kg)    SpO2 96%    BMI 30.42 kg/m  General: no acute distress , A&Ox3 HEENT: PEERL, conjunctiva normal,TMs normal. Right cheek with 1.5cm minimally tender swelling with overlying erythema and mild warmth w/o fluctuance, no streaking. Small pustules on right side of nose with erythema. Clear throat. No lad.  Skin:  Warm, no rashes     Commons side effects, risks, benefits, and alternatives for medications and treatment plan prescribed today were discussed, and the patient expressed understanding of the given instructions.  Patient is instructed to call or message via MyChart if he/she has any questions or concerns regarding our treatment plan. No barriers to understanding were identified. We discussed Red Flag symptoms and signs in detail. Patient expressed understanding regarding what to do in case of urgent or emergency type symptoms.   Medication list was reconciled, printed and provided to the patient in AVS. Patient instructions and summary information was reviewed with the patient as documented in the AVS. This note was prepared with assistance of Dragon voice recognition software. Occasional wrong-word or sound-a-like substitutions may have occurred due to the inherent limitations of voice recognition software  This visit occurred during the SARS-CoV-2 public health emergency.  Safety protocols were in place, including screening questions prior to the visit, additional usage of staff PPE, and extensive cleaning of exam room while observing appropriate contact time as indicated for disinfecting solutions.

## 2019-11-07 NOTE — Telephone Encounter (Signed)
  Follow up Call-  Call back number 11/05/2019  Post procedure Call Back phone  # [phone]  Permission to leave phone message Yes  Some recent data might be hidden     Patient questions:  Do you have a fever, pain , or abdominal swelling? No. Pain Score  0 *  Have you tolerated food without any problems? Yes.    Have you been able to return to your normal activities? Yes.    Do you have any questions about your discharge instructions: Diet   No. Medications  No. Follow up visit  No.  Do you have questions or concerns about your Care? No.  Actions: * If pain score is 4 or above: 1. No action needed, pain <4.Have you developed a fever since your procedure? no  2.   Have you had an respiratory symptoms (SOB or cough) since your procedure? no  3.   Have you tested positive for COVID 19 since your procedure no  4.   Have you had any family members/close contacts diagnosed with the COVID 19 since your procedure?  no   If yes to any of these questions please route to Joylene John, RN and Alphonsa Gin, Therapist, sports.

## 2019-11-08 ENCOUNTER — Ambulatory Visit (INDEPENDENT_AMBULATORY_CARE_PROVIDER_SITE_OTHER)
Admission: RE | Admit: 2019-11-08 | Discharge: 2019-11-08 | Disposition: A | Discharge status: Home / Self Care | Payer: Medicare Other | Source: Ambulatory Visit | Attending: Family Medicine | Admitting: Family Medicine

## 2019-11-08 DIAGNOSIS — M8580 Other specified disorders of bone density and structure, unspecified site: Secondary | ICD-10-CM

## 2019-11-08 DIAGNOSIS — E2839 Other primary ovarian failure: Secondary | ICD-10-CM

## 2019-11-13 ENCOUNTER — Encounter: Payer: Self-pay | Admitting: Family Medicine

## 2019-11-13 ENCOUNTER — Encounter: Payer: Self-pay | Admitting: Gastroenterology

## 2019-11-15 ENCOUNTER — Ambulatory Visit: Payer: Medicare Other | Admitting: Family Medicine

## 2019-11-15 ENCOUNTER — Other Ambulatory Visit: Payer: Self-pay

## 2019-11-15 ENCOUNTER — Encounter: Payer: Self-pay | Admitting: Family Medicine

## 2019-11-15 VITALS — BP 116/74 | HR 69 | Temp 97.2°F | Ht 61.0 in | Wt 159.0 lb

## 2019-11-15 DIAGNOSIS — R200 Anesthesia of skin: Secondary | ICD-10-CM | POA: Diagnosis not present

## 2019-11-15 DIAGNOSIS — G43109 Migraine with aura, not intractable, without status migrainosus: Secondary | ICD-10-CM | POA: Diagnosis not present

## 2019-11-15 MED ORDER — GABAPENTIN 300 MG PO CAPS: 300.0000 mg | ORAL_CAPSULE | Freq: Three times a day (TID) | ORAL | 4 refills | Status: DC

## 2019-11-15 NOTE — Patient Instructions (Signed)
Continue gabapentin 300mg  twice daily with extra 300mg  dose as needed   Follow up in 1 year, sooner if needed   Migraine Headache A migraine headache is a very strong throbbing pain on one side or both sides of your head. This type of headache can also cause other symptoms. It can last from 4 hours to 3 days. Talk with your doctor about what things may bring on (trigger) this condition. What are the causes? The exact cause of this condition is not known. This condition may be triggered or caused by:  Drinking alcohol.  Smoking.  Taking medicines, such as: ? Medicine used to treat chest pain (nitroglycerin). ? Birth control pills. ? Estrogen. ? Some blood pressure medicines.  Eating or drinking certain products.  Doing physical activity. Other things that may trigger a migraine headache include:  Having a menstrual period.  Pregnancy.  Hunger.  Stress.  Not getting enough sleep or getting too much sleep.  Weather changes.  Tiredness (fatigue). What increases the risk?  Being 75-11 years old.  Being female.  Having a family history of migraine headaches.  Being Caucasian.  Having depression or anxiety.  Being very overweight. What are the signs or symptoms?  A throbbing pain. This pain may: ? Happen in any area of the head, such as on one side or both sides. ? Make it hard to do daily activities. ? Get worse with physical activity. ? Get worse around bright lights or loud noises.  Other symptoms may include: ? Feeling sick to your stomach (nauseous). ? Vomiting. ? Dizziness. ? Being sensitive to bright lights, loud noises, or smells.  Before you get a migraine headache, you may get warning signs (an aura). An aura may include: ? Seeing flashing lights or having blind spots. ? Seeing bright spots, halos, or zigzag lines. ? Having tunnel vision or blurred vision. ? Having numbness or a tingling feeling. ? Having trouble talking. ? Having weak  muscles.  Some people have symptoms after a migraine headache (postdromal phase), such as: ? Tiredness. ? Trouble thinking (concentrating). How is this treated?  Taking medicines that: ? Relieve pain. ? Relieve the feeling of being sick to your stomach. ? Prevent migraine headaches.  Treatment may also include: ? Having acupuncture. ? Avoiding foods that bring on migraine headaches. ? Learning ways to control your body functions (biofeedback). ? Therapy to help you know and deal with negative thoughts (cognitive behavioral therapy). Follow these instructions at home: Medicines  Take over-the-counter and prescription medicines only as told by your doctor.  Ask your doctor if the medicine prescribed to you: ? Requires you to avoid driving or using heavy machinery. ? Can cause trouble pooping (constipation). You may need to take these steps to prevent or treat trouble pooping:  Drink enough fluid to keep your pee (urine) pale yellow.  Take over-the-counter or prescription medicines.  Eat foods that are high in fiber. These include beans, whole grains, and fresh fruits and vegetables.  Limit foods that are high in fat and sugar. These include fried or sweet foods. Lifestyle  Do not drink alcohol.  Do not use any products that contain nicotine or tobacco, such as cigarettes, e-cigarettes, and chewing tobacco. If you need help quitting, ask your doctor.  Get at least 8 hours of sleep every night.  Limit and deal with stress. General instructions      Keep a journal to find out what may bring on your migraine headaches. For example, write down: ?  What you eat and drink. ? How much sleep you get. ? Any change in what you eat or drink. ? Any change in your medicines.  If you have a migraine headache: ? Avoid things that make your symptoms worse, such as bright lights. ? It may help to lie down in a dark, quiet room. ? Do not drive or use heavy machinery. ? Ask your  doctor what activities are safe for you.  Keep all follow-up visits as told by your doctor. This is important. Contact a doctor if:  You get a migraine headache that is different or worse than others you have had.  You have more than 15 headache days in one month. Get help right away if:  Your migraine headache gets very bad.  Your migraine headache lasts longer than 72 hours.  You have a fever.  You have a stiff neck.  You have trouble seeing.  Your muscles feel weak or like you cannot control them.  You start to lose your balance a lot.  You start to have trouble walking.  You pass out (faint).  You have a seizure. Summary  A migraine headache is a very strong throbbing pain on one side or both sides of your head. These headaches can also cause other symptoms.  This condition may be treated with medicines and changes to your lifestyle.  Keep a journal to find out what may bring on your migraine headaches.  Contact a doctor if you get a migraine headache that is different or worse than others you have had.  Contact your doctor if you have more than 15 headache days in a month. This information is not intended to replace advice given to you by your health care provider. Make sure you discuss any questions you have with your health care provider. Document Released: 08/31/2008 Document Revised: 03/16/2019 Document Reviewed: 01/04/2019 Elsevier Patient Education  2020 [employer].

## 2019-11-15 NOTE — Progress Notes (Signed)
PATIENT: Megan [name] DOB: 1946/03/07  REASON FOR VISIT: follow up HISTORY FROM: patient  Chief Complaint  Patient presents with  . Follow-up    Room 2, alone. Doing well, states the gabapentin is working. 2 months starting to have mild migraines.      HISTORY OF PRESENT ILLNESS: Today 11/18/19 Megan [name] is a 73 y.o. female here today for follow up of headaches and paresthesias.. She usually takes 2 tablets of gabapentin but will take an extra dose as needed. Lip burning and tingling persists but it is manageable. She does feel that gabapentin helps with right jaw pain. She has noted more headaches over the past two months. She notes change in vision just prior to a headache. She describes it as "blurry vision." No distinct lines or flashing lights. She will take an 325mg  aspirin when she notes blurry vision.  She takes a daily dose of 325 mg aspirin for stroke prevention.  She denies any gastric concerns or unusual bleeding.   HISTORY: (copied from Dr Gladstone Lighter note on 11/14/2018)  UPDATE (11/14/18, VRP): Since last visit, doing well. Symptoms (implant pain improved; numbness / burning is stable) are improved with gabapentin 300mg  three times a day. Severity is moderate. No alleviating or aggravating factors. Tolerating meds. No headaches.   UPDATE (05/09/18, VRP): Since last visit, doing well and migraine resolved on their own. No alleviating or aggravating factors.   Now with new onset of bilateral upper lip burning sensation and increased lower lip tingling since new dentures (upper and lower) since Jan 2019. Upper lip burning worse with patient has dentures in place, and slightly reduced when dentures taken out over night. Symptoms initially improving with denture adjustments, but now symptoms are slightly worse in last 2-3 weeks.   PRIOR HPI (08/10/17): 73 year old female here for evaluation of headaches.  Patient had onset of headaches around age 37 years  old, sometimes preceded by seeing wavy lines of water. Headaches would be unilateral or bilateral, sometimes associated with unilateral hemibody numbness, sometimes a fascia, with headaches lasting up to one dated time. Patient had one or 2 headaches per year. After she had a hysterectomy in 1992 her headaches significant improved. Patient was never officially diagnosed with migraine headaches but suspected that she had migraines due to her sister's history of migraines and her paternal grandmother's history of migraines. Patient never tried prescription migraine medication.  06/15/2017 patient had cold sensation and chills. She then had headache which continued until the next day. This reminded her of her previous headaches earlier in life. Headache recurred another day later and lasted for 5 weeks continuously. She had 2 episodes of visual aura. She then had headaches from August 20 until the 30th. She had another headache from September 2 of September 5. No nausea vomiting. No photophobia or phonophobia. She has had off-balance sensation. She has had left-sided headaches ranging from 3 out of 10 in severity. She has tried Midrin and Fioricet without relief. She is tried ibuprofen and Tylenol without relief.  Initially patient cannot identify any triggering factors. However 1 week prior to onset of headaches she had a tick bite. Also starting in May 2018 through July 2018, patient had significant dental and oral surgery procedures including 5 oral surgery implants as well as a bone spur removal. She has been left with residual post surgical numbness in her right lower lip and chin.  Also in the end of July 2018 patient had sudden onset of hearing loss  in her right ear. No other associated symptoms. Patient went to ENT for evaluation. Patient was treated empirically with prednisone, acyclovir and doxycycline. Her hearing loss has significantly improved.   REVIEW OF SYSTEMS: Out of a complete 14 system  review of symptoms, the patient complains only of the following symptoms, none reported and all other reviewed systems are negative.  ALLERGIES: Allergies  Allergen Reactions  . Anticoagulant Compound Itching and Other (See Comments)    Hands and feet tingle and itch. Hands and feet tingle and itch.  . Morphine And Related Nausea Only and Itching  . Bextra [Valdecoxib] Other (See Comments)    Increased lft's Increased lft's  . Hydrocod Polst-Cpm Polst Er Rash  . Lipitor [Atorvastatin] Other (See Comments)    Leg weakness Leg weakness  . Mevacor [Lovastatin] Other (See Comments)    Muscle weakness Muscle weakness  . Plavix  [Clopidogrel Bisulfate] Itching and Other (See Comments)    Hands and feet tingle and itch.  . Zocor [Simvastatin] Other (See Comments)    Bones hurt Bones hurt   . Doxycycline Diarrhea    N/v/d  . Metoprolol Other (See Comments)    Hair loss  . Plavix [Clopidogrel Bisulfate] Other (See Comments)    Itching of hands and feet  . Codeine Itching and Rash    Hyper-active  . Hydrocod Polst-Cpm Polst Er Rash  . Lisinopril Cough    HOME MEDICATIONS: Outpatient Medications Prior to Visit  Medication Sig Dispense Refill  . Accu-Chek FastClix Lancets MISC Check blood sugar once daily PRN 100 each 1  . albuterol (PROVENTIL HFA;VENTOLIN HFA) 108 (90 Base) MCG/ACT inhaler Inhale 2 puffs into the lungs every 4 (four) hours as needed for wheezing or shortness of breath. 1 Inhaler 2  . ALPRAZolam (XANAX) 0.25 MG tablet Take 1 tablet (0.25 mg total) by mouth at bedtime as needed. 90 tablet 1  . amLODipine (NORVASC) 5 MG tablet Take 1 tablet (5 mg total) by mouth daily. 90 tablet 3  . aspirin 325 MG tablet Take 325 mg by mouth daily.    [name] atorvastatin (LIPITOR) 80 MG tablet Take 1 tablet (80 mg total) by mouth daily. 90 tablet 3  . augmented betamethasone dipropionate (DIPROLENE-AF) 0.05 % cream Apply 1 application topically daily as needed. For hands  1  . B  Complex-Biotin-FA (B-100 COMPLEX PO) B-100 Complex    . cephALEXin (KEFLEX) 500 MG capsule Take 1 capsule (500 mg total) by mouth 2 (two) times daily. 14 capsule 0  . ciclopirox (LOPROX) 0.77 % cream Apply 1 application topically daily as needed (hands).     . clobetasol cream (TEMOVATE) [id number] % Apply 1 application topically 2 (two) times daily as needed (hands).     . CORDRAN 4 MCG/SQCM TAPE as needed.    . cycloSPORINE (RESTASIS) 0.05 % ophthalmic emulsion Place 1 drop into both eyes at bedtime.    [name] glucose blood (ACCU-CHEK SMARTVIEW) test strip Check blood sugar once daily PRN 100 each 1  . hydrALAZINE (APRESOLINE) 10 MG tablet TAKE 1 TABLET BY MOUTH THREE TIMES A DAY 270 tablet 2  . Icosapent Ethyl (VASCEPA) 0.5 g CAPS Take 4 capsules by mouth 2 (two) times daily. 240 capsule 6  . losartan-hydrochlorothiazide (HYZAAR) 100-25 MG tablet Take 1 tablet by mouth daily. 90 tablet 3  . nitroGLYCERIN (NITROSTAT) 0.4 MG SL tablet Place 1 tablet (0.4 mg total) under the tongue every 5 (five) minutes as needed for chest pain. 25 tablet PRN  .  omeprazole (PRILOSEC) 40 MG capsule TAKE 1 CAPSULE BY MOUTH TWICE A DAY 60 capsule 11  . potassium chloride (K-DUR) 10 MEQ tablet Take 2 tablets (20 mEq total) by mouth 2 (two) times daily. 360 tablet 3  . tretinoin (RETIN-A) 0.1 % cream Apply 1 application topically every other day.    . Vitamin D, Ergocalciferol, 2000 units CAPS Take 1 capsule by mouth daily.     [name] gabapentin (NEURONTIN) 300 MG capsule Take 1-2 capsules (300-600 mg total) by mouth 3 (three) times daily. 540 capsule 4  . HYDROcodone-acetaminophen (NORCO/VICODIN) 5-325 MG tablet hydrocodone 5 mg-acetaminophen 325 mg tablet  TAKE 1 TABLET(S) TWICE A DAY BY ORAL ROUTE AS NEEDED FOR PAIN FOR 7 DAYS.    [name] torsemide (DEMADEX) 20 MG tablet Take 1 tablet (20 mg total) by mouth daily as needed. 30 tablet 6   No facility-administered medications prior to visit.    PAST MEDICAL HISTORY: Past Medical  History:  Diagnosis Date  . AAA (abdominal aortic aneurysm) ([place])    a. s/p stent grafting in 2009.  . Adenomatous colon polyp 05/2001  . Adenomatous duodenal polyp   . Allergy   . Anginal pain ([place])   . COPD (chronic obstructive pulmonary disease) ([place])    pt denies COPD  . Coronary artery disease    a. s/p CABG in 1995;  b. 05/2013 Neg MV, EF 82%;  c. 06/2013 Cath: LM nl, LAD 40-50p, LCX nl, RCA 80p/141m, VG->RCA->PDA->PLSA min irregs, LIMA->LAD atretic, vigorous LV fxn.  . Diabetes mellitus without complication (HCC)    history of, resolved. 2017  . Diverticulosis   . Eczema, dyshidrotic   . Eczema, dyshidrotic 1986   Dr. Mathis Fare  . Emphysema of lung ([place])   . Fibromyalgia   . GERD (gastroesophageal reflux disease)   . Hyperlipidemia   . Hypertension   . Migraine   . Myocardial infarction ([place])   . Osteoarthritis   . Osteoporosis    unsure, having a bone scan soon  . Peripheral arterial disease (HCC)    left leg diagnosed by Dr Mare Ferrari  . Renal artery stenosis ([place])    a. 2008 s/p PTA    PAST SURGICAL HISTORY: Past Surgical History:  Procedure Laterality Date  . ABDOMINAL AORTAGRAM N/A 03/06/2014   Procedure: ABDOMINAL AORTAGRAM;  Surgeon: Rosetta Posner, MD;  Location[facility] LAB;  Service: Cardiovascular;  Laterality: N/A;  . ABDOMINAL AORTIC ANEURYSM REPAIR  2009   stenting  . ABDOMINAL HYSTERECTOMY     Total, 1992  . BREAST BIOPSY    . CARDIAC CATHETERIZATION    . CARDIOVASCULAR STRESS TEST  11/11/2009   normal study  . CARPAL TUNNEL RELEASE  08/1993   left wrist  . CARPAL TUNNEL RELEASE  01/1997   right  . CORONARY ARTERY BYPASS GRAFT  07/1994   Triple by Dr Ceasar Mons  . dental implants    . HAND SURGERY Right 09/2002   Right hand pulley release  . LEFT HEART CATHETERIZATION WITH CORONARY/GRAFT ANGIOGRAM N/A 06/11/2013   Procedure: LEFT HEART CATHETERIZATION WITH [name];  Surgeon: Thayer Headings, MD;  Location: [facility] LAB;  Service:  Cardiovascular;  Laterality: N/A;  . MULTIPLE TOOTH EXTRACTIONS  2000   All upper teeth.  Has full dneture.   [name] RENAL ARTERY STENT  2008  . SHOULDER ARTHROSCOPY WITH ROTATOR CUFF REPAIR Right 09/27/2018   Procedure: Right shoulder mini open rotator cuff repair;  Surgeon: Susa Day, MD;  Location:  [facility] ORS;  Service: Orthopedics;  Laterality: Right;  90 mins  . thyroid cyst apiration  05/1997 and 07/1998  . TONSILECTOMY, ADENOIDECTOMY, BILATERAL MYRINGOTOMY AND TUBES  1989  . TOTAL ABDOMINAL HYSTERECTOMY  1992    FAMILY HISTORY: Family History  Problem Relation Age of Onset  . Heart disease Mother   . Heart attack Mother   . Heart disease Father        before age 70  . Heart attack Father   . Heart disease Sister        before age 65  . Diabetes Sister   . Hyperlipidemia Sister   . Hypertension Sister   . Heart attack Sister   . Liver cancer Sister   . Breast cancer Sister   . Heart disease Brother   . Diabetes Brother   . Hyperlipidemia Brother   . Hypertension Brother   . Heart attack Brother   . AAA (abdominal aortic aneurysm) Brother   . Colon cancer Paternal Grandmother   . Heart disease Brother   . Colon cancer Cousin        first cousin on father's side  . Pancreatic cancer Neg Hx   . Rectal cancer Neg Hx   . Stomach cancer Neg Hx   . Esophageal cancer Neg Hx     SOCIAL HISTORY: Social History   Socioeconomic History  . Marital status: Married    Spouse name: Not on file  . Number of children: Not on file  . Years of education: Not on file  . Highest education level: Not on file  Occupational History  . Not on file  Tobacco Use  . Smoking status: Former Smoker    Packs/day: 0.00    Years: 37.00    Pack years: 0.00    Types: Cigarettes    Quit date: 12/07/1995    Years since quitting: 23.9  . Smokeless tobacco: Never Used  Substance and Sexual Activity  . Alcohol use: Yes    Alcohol/week: 6.0 - 10.0 standard drinks    Types: 6 - 10 Glasses of wine  per week    Comment: 2-3 glasses daily  . Drug use: No  . Sexual activity: Not Currently    Partners: Male  Other Topics Concern  . Not on file  Social History Narrative   Lives at home with husband.  Is retired.  Education 4 yrs of college.  No children.   Caffiene 2 cups daily.    Social Determinants of Health   Financial Resource Strain:   . Difficulty of Paying Living Expenses: Not on file  Food Insecurity:   . Worried About Charity fundraiser in the Last Year: Not on file  . Ran Out of Food in the Last Year: Not on file  Transportation Needs:   . Lack of Transportation (Medical): Not on file  . Lack of Transportation (Non-Medical): Not on file  Physical Activity:   . Days of Exercise per Week: Not on file  . Minutes of Exercise per Session: Not on file  Stress:   . Feeling of Stress : Not on file  Social Connections:   . Frequency of Communication with Friends and Family: Not on file  . Frequency of Social Gatherings with Friends and Family: Not on file  . Attends Religious Services: Not on file  . Active Member of Clubs or Organizations: Not on file  . Attends Archivist Meetings: Not on file  . Marital Status: Not on file  Intimate Partner Violence:   . Fear of Current or Ex-Partner: Not on file  . Emotionally Abused: Not on file  . Physically Abused: Not on file  . Sexually Abused: Not on file      PHYSICAL EXAM  Vitals:   11/15/19 1131  BP: 116/74  Pulse: 69  Temp: (!) 97.2 F (36.2 C)  Weight: 159 lb (72.1 kg)  Height: 5\' 1"  (1.549 m)   Body mass index is 30.04 kg/m.  Generalized: Well developed, in no acute distress  Cardiology: normal rate and rhythm, no murmur noted Neurological examination  Mentation: Alert oriented to time, place, history taking. Follows all commands speech and language fluent Cranial nerve II-XII: Pupils were equal round reactive to light. Extraocular movements were full, visual field were full on confrontational  test. Facial sensation and strength were normal. Uvula tongue midline. Head turning and shoulder shrug  were normal and symmetric. Motor: The motor testing reveals 5 over 5 strength of all 4 extremities. Good symmetric motor tone is noted throughout.  Sensory: Sensory testing is intact to soft touch on all 4 extremities. No evidence of extinction is noted.  Coordination: Cerebellar testing reveals good finger-nose-finger and heel-to-shin bilaterally.  Gait and station: Gait is normal. Tandem gait is normal. Romberg is negative. No drift is seen.     DIAGNOSTIC DATA (LABS, IMAGING, TESTING) - I reviewed patient records, labs, notes, testing and imaging myself where available.  MMSE - Mini Mental State Exam 01/25/2019  Orientation to time 5  Orientation to Place 5  Registration 3  Attention/ Calculation 5  Recall 0  Language- name 2 objects 2  Language- repeat 1  Language- follow 3 step command 3  Language- read & follow direction 1  Write a sentence 1  Copy design 1  Total score 27     Lab Results  Component Value Date   WBC 8.3 01/25/2019   HGB 14.9 01/25/2019   HCT 43.8 01/25/2019   MCV 89.3 01/25/2019   PLT 197.0 01/25/2019      Component Value Date/Time   NA 138 07/26/2019 0815   K 3.9 07/26/2019 0815   CL 93 (L) 07/26/2019 0815   CO2 29 07/26/2019 0815   GLUCOSE 123 (H) 07/26/2019 0815   GLUCOSE 108 (H) 01/25/2019 1108   BUN 16 07/26/2019 0815   CREATININE 0.85 07/26/2019 0815   CREATININE 0.71 06/29/2016 1057   CALCIUM 10.6 (H) 07/26/2019 0815   PROT 6.4 07/26/2019 0815   ALBUMIN 4.5 07/26/2019 0815   AST 39 07/26/2019 0815   ALT 43 (H) 07/26/2019 0815   ALKPHOS 72 07/26/2019 0815   BILITOT 0.7 07/26/2019 0815   GFRNONAA 68 07/26/2019 0815   GFRAA 78 07/26/2019 0815   Lab Results  Component Value Date   CHOL 153 07/26/2019   HDL 44 07/26/2019   LDLCALC 61 07/26/2019   LDLDIRECT 122.0 01/25/2019   TRIG 241 (H) 07/26/2019   CHOLHDL 3.5 07/26/2019   Lab  Results  Component Value Date   HGBA1C 6.2 (H) 07/26/2019   No results found for: VITAMINB12 Lab Results  Component Value Date   TSH 2.05 01/25/2019     ASSESSMENT AND PLAN 73 y.o. year old female  has a past medical history of AAA (abdominal aortic aneurysm) ([place]), Adenomatous colon polyp (05/2001), Adenomatous duodenal polyp, Allergy, Anginal pain ([place]), COPD (chronic obstructive pulmonary disease) ([place]), Coronary artery disease, Diabetes mellitus without complication ([place]), Diverticulosis, Eczema, dyshidrotic, Eczema, dyshidrotic (1986), Emphysema of lung ([place]), Fibromyalgia, GERD (  gastroesophageal reflux disease), Hyperlipidemia, Hypertension, Migraine, Myocardial infarction ([place]), Osteoarthritis, Osteoporosis, Peripheral arterial disease ([place]), and Renal artery stenosis ([place]). here with     ICD-10-CM   1. Migraine with aura and without status migrainosus, not intractable  G43.109   2. Lip numbness  R20.0     Overall Ms. Grahovac is doing very well.  She does note significant improvement in headaches with gabapentin.  She is uncertain if this is helped significantly with the lip or gel numbness.  She does admit that she is taking only 1-2 doses per day.  She has been using 325 mg aspirin for abortive therapy.  She currently takes 325 mg of aspirin every day.  I have warned against extra doses of aspirin.  I have advised that she increase gabapentin dose as prescribed.  She will take 1 to 2 tablets 3 times daily.  She has tolerated this medication thus far, however, we will reach out with any new or concerning side effects.  Common side effects discussed in the office.  Adequate hydration, healthy diet and regular exercise advised.  She will follow-up with me in 1 year, sooner if needed.  She verbalizes understanding agreement this plan.   No orders of the defined types were placed in this encounter.    Meds ordered this encounter  Medications  . gabapentin (NEURONTIN) 300 MG capsule     Sig: Take 1-2 capsules (300-600 mg total) by mouth 3 (three) times daily.    Dispense:  540 capsule    Refill:  4    Please cancel previous prescription refills.    Order Specific Question:   Supervising Provider    Answer:   Melvenia Beam [id number], FNP-C 11/18/2019, 4:26 PM [facility] [address] [place], [place] [address] [phone]

## 2019-11-16 ENCOUNTER — Ambulatory Visit: Payer: Medicare Other | Admitting: Family Medicine

## 2019-11-18 ENCOUNTER — Encounter: Payer: Self-pay | Admitting: Family Medicine

## 2019-11-21 ENCOUNTER — Encounter: Payer: Self-pay | Admitting: Family Medicine

## 2019-11-26 DIAGNOSIS — M5136 Other intervertebral disc degeneration, lumbar region: Secondary | ICD-10-CM | POA: Diagnosis not present

## 2019-11-27 ENCOUNTER — Encounter: Payer: Self-pay | Admitting: Family Medicine

## 2019-11-29 MED ORDER — CEPHALEXIN 500 MG PO CAPS: 500.0000 mg | ORAL_CAPSULE | Freq: Two times a day (BID) | ORAL | 0 refills | Status: DC

## 2019-12-04 ENCOUNTER — Encounter: Payer: Self-pay | Admitting: Family Medicine

## 2019-12-04 NOTE — Telephone Encounter (Signed)
Please call to schedule a virtual visit. May warrant covid testing. Can be with me or another provider. Can be on Thursday with me.

## 2019-12-06 ENCOUNTER — Encounter: Payer: Self-pay | Admitting: Family Medicine

## 2019-12-06 ENCOUNTER — Ambulatory Visit (INDEPENDENT_AMBULATORY_CARE_PROVIDER_SITE_OTHER): Payer: Medicare Other | Admitting: Family Medicine

## 2019-12-06 VITALS — Ht 61.0 in | Wt 159.0 lb

## 2019-12-06 DIAGNOSIS — J441 Chronic obstructive pulmonary disease with (acute) exacerbation: Secondary | ICD-10-CM

## 2019-12-06 DIAGNOSIS — L03211 Cellulitis of face: Secondary | ICD-10-CM

## 2019-12-06 MED ORDER — PREDNISONE 10 MG PO TABS: ORAL_TABLET | ORAL | 0 refills | Status: DC

## 2019-12-06 MED ORDER — GUAIFENESIN-CODEINE 100-10 MG/5ML PO SOLN: 5.0000 mL | Freq: Four times a day (QID) | ORAL | 0 refills | Status: DC | PRN

## 2019-12-06 MED ORDER — SULFAMETHOXAZOLE-TRIMETHOPRIM 800-160 MG PO TABS: 1.0000 | ORAL_TABLET | Freq: Two times a day (BID) | ORAL | 0 refills | Status: DC

## 2019-12-06 NOTE — Progress Notes (Signed)
Virtual Visit via Video Note  Subjective  CC:  Chief Complaint  Patient presents with  . Cough    7-8 days     I connected with [name] on 12/06/19 at 10:00 AM EST by a video enabled telemedicine application and verified that I am speaking with the correct person using two identifiers. Location patient: Home Location provider: [facility] [place], Office Persons participating in the virtual visit: Marlii Frost, [name], MD Serita Sheller, [place] discussed the limitations of evaluation and management by telemedicine and the availability of in person appointments. The patient expressed understanding and agreed to proceed. HPI:   Burdette Bueti is a 73 y.o. female who complains of congestion, nasal blockage, post nasal drip, cough described as barky, harsh, paroxysmal and productive and denies sinus, high fevers, SOB, chest pain or significant GI symptoms. She has copd and this feels like an exacerbation. She has some wheezing requiring her albuterol use. Last treated for same in September. No exposures to covid: no headache, loss of taste nor smell, fevers or myalgias. Symptoms have been present for 5 days. She  denies a history of anorexia, dizziness, vomiting. No loss of appetite. No sick contacts  Cellulitis on face/cheek: completed course of keflex and resolved. However, has new red spot on right side of nose. No drainage.    Assessment  1. COPD with acute exacerbation ([place])   2. Cellulitis, face      Plan   Copd exacerbation:  Septra ds, rob ac and pred taper. Monitor and f/u if worsening. Pt is self isolating.   Cellulitis: ? MRSA: septra and f/u if needed.  I discussed the assessment and treatment plan with the patient. The patient was provided an opportunity to ask questions and all were answered. The patient agreed with the plan and demonstrated an understanding of the instructions.   The patient was advised to call  back or seek an in-person evaluation if the symptoms worsen or if the condition fails to improve as anticipated. Follow up: No follow-ups on file.  01/30/2020  Meds ordered this encounter  Medications  . predniSONE (DELTASONE) 10 MG tablet    Sig: Take 4 tabs qd x 2 days, 3 qd x 2 days, 2 qd x 2d, 1qd x 3 days    Dispense:  21 tablet    Refill:  0  . sulfamethoxazole-trimethoprim (BACTRIM DS) 800-160 MG tablet    Sig: Take 1 tablet by mouth 2 (two) times daily.    Dispense:  14 tablet    Refill:  0  . guaiFENesin-codeine 100-10 MG/5ML syrup    Sig: Take 5 mLs by mouth every 6 (six) hours as needed for cough.    Dispense:  120 mL    Refill:  0      I reviewed the patients updated PMH, FH, and SocHx.    Patient Active Problem List   Diagnosis Date Noted  . COPD with chronic bronchitis ([place]) 09/28/2018    Priority: High  . Essential hypertension 07/10/2018    Priority: High  . IFG (impaired fasting glucose) 11/10/2017    Priority: High  . 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 Coronary artery disease     Priority: High  . Peripheral vascular disease with claudication ([place])     Priority: High  . 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  History of abdominal aortic aneurysm repair 05/18/2011    Priority: High  . Mixed hyperlipidemia     Priority: High  . DJD (degenerative joint disease), lumbosacral 11/10/2017    Priority: Medium  . Fibromyalgia 11/10/2017    Priority: Medium  . GERD (gastroesophageal reflux disease) 11/10/2017    Priority: Medium  . Fatty liver 11/10/2017    Priority: Medium  . 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    Priority: Medium  . History of renal artery stenosis 05/18/2011    Priority: Medium  . Dyshidrotic eczema 11/10/2017    Priority: Low  . Osteopenia 11/10/2017    Priority: Low  . Does use hearing aid 11/10/2017    Priority: Low  . Plantar fasciitis of left foot  05/30/2019  . Degeneration of lumbar intervertebral disc 02/22/2019  . Right rotator cuff tear 09/27/2018  . Complete rotator cuff tear 09/27/2018   Current Meds  Medication Sig  . Accu-Chek FastClix Lancets MISC Check blood sugar once daily PRN  . albuterol (PROVENTIL HFA;VENTOLIN HFA) 108 (90 Base) MCG/ACT inhaler Inhale 2 puffs into the lungs every 4 (four) hours as needed for wheezing or shortness of breath.  . ALPRAZolam (XANAX) 0.25 MG tablet Take 1 tablet (0.25 mg total) by mouth at bedtime as needed.  [name] amLODipine (NORVASC) 5 MG tablet Take 1 tablet (5 mg total) by mouth daily.  [name] aspirin 325 MG tablet Take 325 mg by mouth daily.  [name] atorvastatin (LIPITOR) 80 MG tablet Take 1 tablet (80 mg total) by mouth daily.  [name] augmented betamethasone dipropionate (DIPROLENE-AF) 0.05 % cream Apply 1 application topically daily as needed. For hands  . B Complex-Biotin-FA (B-100 COMPLEX PO) B-100 Complex  . ciclopirox (LOPROX) 0.77 % cream Apply 1 application topically daily as needed (hands).   . clobetasol cream (TEMOVATE) [id number]-123456789 % Apply 1 application topically 2 (two) times daily as needed (hands).   . CORDRAN 4 MCG/SQCM TAPE as needed.  . cycloSPORINE (RESTASIS) 0.05 % ophthalmic emulsion Place 1 drop into both eyes at bedtime.  . gabapentin (NEURONTIN) 300 MG capsule Take 1-2 capsules (300-600 mg total) by mouth 3 (three) times daily.  [name] glucose blood (ACCU-CHEK SMARTVIEW) test strip Check blood sugar once daily PRN  . hydrALAZINE (APRESOLINE) 10 MG tablet TAKE 1 TABLET BY MOUTH THREE TIMES A DAY  . Icosapent Ethyl (VASCEPA) 0.5 g CAPS Take 4 capsules by mouth 2 (two) times daily.  [name] losartan-hydrochlorothiazide (HYZAAR) 100-25 MG tablet Take 1 tablet by mouth daily.  . nitroGLYCERIN (NITROSTAT) 0.4 MG SL tablet Place 1 tablet (0.4 mg total) under the tongue every 5 (five) minutes as needed for chest pain.  [name] omeprazole (PRILOSEC) 40 MG capsule TAKE 1 CAPSULE BY MOUTH TWICE A DAY  . potassium  chloride (K-DUR) 10 MEQ tablet Take 2 tablets (20 mEq total) by mouth 2 (two) times daily.  [name] tretinoin (RETIN-A) 0.1 % cream Apply 1 application topically every other day.  . Vitamin D, Ergocalciferol, 2000 units CAPS Take 1 capsule by mouth daily.   . [DISCONTINUED] HYDROcodone-acetaminophen (NORCO/VICODIN) 5-325 MG tablet hydrocodone 5 mg-acetaminophen 325 mg tablet  TAKE 1 TABLET(S) TWICE A DAY BY ORAL ROUTE AS NEEDED FOR PAIN FOR 7 DAYS.    Allergies: Patient is allergic to anticoagulant compound; morphine and related; bextra [valdecoxib]; hydrocod polst-cpm polst er; lipitor [atorvastatin]; mevacor [lovastatin]; plavix  [clopidogrel bisulfate]; zocor [simvastatin]; doxycycline; metoprolol; plavix [clopidogrel bisulfate]; codeine; hydrocod polst-cpm polst er; and lisinopril. Family History: Patient family history includes AAA (abdominal aortic aneurysm)  in her brother; Breast cancer in her sister; Colon cancer in her cousin and paternal grandmother; Diabetes in her brother and sister; Heart attack in her brother, father, mother, and sister; Heart disease in her brother, brother, father, mother, and sister; Hyperlipidemia in her brother and sister; Hypertension in her brother and sister; Liver cancer in her sister. Social History:  Patient  reports that she quit smoking about 24 years ago. Her smoking use included cigarettes. She smoked 0.00 packs per day for 37.00 years. She has never used smokeless tobacco. She reports current alcohol use of about 6.0 - 10.0 standard drinks of alcohol per week. She reports that she does not use drugs.  Review of Systems: Constitutional: Negative for fever malaise or anorexia Cardiovascular: negative for chest pain Respiratory: negative for SOB or persistent cough Gastrointestinal: negative for abdominal pain  OBJECTIVE Vitals: Ht 5\' 1"  (1.549 m)   Wt 159 lb (72.1 kg)   BMI 30.04 kg/m  General: no acute distress , A&Ox3, no respiratory distress, +  cough. Normal speech.   [name], MD

## 2019-12-07 HISTORY — PX: CATARACT EXTRACTION, BILATERAL: SHX1313

## 2019-12-17 ENCOUNTER — Ambulatory Visit: Payer: Medicare Other | Admitting: Family Medicine

## 2019-12-20 ENCOUNTER — Other Ambulatory Visit: Payer: Self-pay

## 2019-12-20 DIAGNOSIS — H25813 Combined forms of age-related cataract, bilateral: Secondary | ICD-10-CM | POA: Diagnosis not present

## 2019-12-20 MED ORDER — AMLODIPINE BESYLATE 5 MG PO TABS: 5.0000 mg | ORAL_TABLET | Freq: Every day | ORAL | 1 refills | Status: DC

## 2019-12-22 ENCOUNTER — Ambulatory Visit: Payer: Medicare Other | Attending: Internal Medicine

## 2019-12-22 DIAGNOSIS — Z23 Encounter for immunization: Secondary | ICD-10-CM | POA: Insufficient documentation

## 2019-12-22 NOTE — Progress Notes (Signed)
   Covid-19 Vaccination Clinic  Name:  Megan [name]    MRN: [id number] DOB: [birth year]  12/22/2019  [name] was observed post Covid-19 immunization for 15 minutes without incidence. She was provided with Vaccine Information Sheet and instruction to access the V-Safe system.   [name] was instructed to call 911 with any severe reactions post vaccine: [name] Difficulty breathing  . Swelling of your face and throat  . A fast heartbeat  . A bad rash all over your body  . Dizziness and weakness    Immunizations Administered    Name Date Dose VIS Date Route   Pfizer COVID-19 Vaccine 12/22/2019 11:01 AM 0.3 mL 11/16/2019 Intramuscular   Manufacturer: [employer], [employer]   Lot: [id number]   [place]: [id number]

## 2019-12-25 DIAGNOSIS — M5136 Other intervertebral disc degeneration, lumbar region: Secondary | ICD-10-CM | POA: Diagnosis not present

## 2019-12-28 ENCOUNTER — Other Ambulatory Visit: Payer: Medicare Other

## 2020-01-01 ENCOUNTER — Other Ambulatory Visit: Payer: Self-pay | Admitting: *Deleted

## 2020-01-01 DIAGNOSIS — I714 Abdominal aortic aneurysm, without rupture: Secondary | ICD-10-CM | POA: Diagnosis not present

## 2020-01-01 DIAGNOSIS — E782 Mixed hyperlipidemia: Secondary | ICD-10-CM | POA: Diagnosis not present

## 2020-01-01 DIAGNOSIS — I739 Peripheral vascular disease, unspecified: Secondary | ICD-10-CM | POA: Diagnosis not present

## 2020-01-01 DIAGNOSIS — E119 Type 2 diabetes mellitus without complications: Secondary | ICD-10-CM | POA: Diagnosis not present

## 2020-01-01 DIAGNOSIS — I119 Hypertensive heart disease without heart failure: Secondary | ICD-10-CM | POA: Diagnosis not present

## 2020-01-01 DIAGNOSIS — I25708 Atherosclerosis of coronary artery bypass graft(s), unspecified, with other forms of angina pectoris: Secondary | ICD-10-CM

## 2020-01-01 LAB — BASIC METABOLIC PANEL
BUN/Creatinine Ratio: 13 (ref 12–28)
BUN: 10 mg/dL (ref 8–27)
CO2: 29 mmol/L (ref 20–29)
Calcium: 10.1 mg/dL (ref 8.7–10.3)
Chloride: 93 mmol/L — ABNORMAL LOW (ref 96–106)
Creatinine, Ser: 0.76 mg/dL (ref 0.57–1.00)
GFR calc Af Amer: 89 mL/min/{1.73_m2} (ref >59)
GFR calc non Af Amer: 77 mL/min/{1.73_m2} (ref >59)
Glucose: 117 mg/dL — ABNORMAL HIGH (ref 65–99)
Potassium: 4.3 mmol/L (ref 3.5–5.2)
Sodium: 137 mmol/L (ref 134–144)

## 2020-01-01 LAB — HEPATIC FUNCTION PANEL
ALT: 40 IU/L — ABNORMAL HIGH (ref 0–32)
AST: 34 IU/L (ref 0–40)
Albumin: 4.4 g/dL (ref 3.7–4.7)
Alkaline Phosphatase: 61 IU/L (ref 39–117)
Bilirubin Total: 0.8 mg/dL (ref 0.0–1.2)
Bilirubin, Direct: 0.23 mg/dL (ref 0.00–0.40)
Total Protein: 6.1 g/dL (ref 6.0–8.5)

## 2020-01-01 LAB — LIPID PANEL
Chol/HDL Ratio: 3.1 ratio (ref 0.0–4.4)
Cholesterol, Total: 154 mg/dL (ref 100–199)
HDL: 50 mg/dL (ref >39)
LDL Chol Calc (NIH): 64 mg/dL (ref 0–99)
Triglycerides: 247 mg/dL — ABNORMAL HIGH (ref 0–149)
VLDL Cholesterol Cal: 40 mg/dL (ref 5–40)

## 2020-01-01 NOTE — Progress Notes (Deleted)
{Choose 1 Note Type (Telehealth Visit or Telephone Visit):[phone]}   Date:  01/01/2020   ID:  Megan [name], DOB 08/20/1946, MRN [id number]  Patient Location: Home Provider Location: Home  PCP:  [name], MD  Cardiologist:  Zaylei Mullane [place] MD Electrophysiologist:  None   Evaluation Performed:  Follow-Up Visit  Chief Complaint:  CAD  History of Present Illness:    Megan [name] is a 74 y.o. female with history of of CAD, HTN, HL, and PAD.   She has a past history of essential hypertension and  coronary artery disease. Also has a history of dyslipidemia. She is status post coronary artery bypass graft surgery in 1995. She had a renal artery stent for renal artery stenosis in 2008. She had a stent graft of an abdominal aortic aneurysm 2009. She is s/p stenting of the left iliac. She is followed by [name] for PAD. Last CT in May 2018. She had [place] in June 2019 showing no endoleak.    She was hospitalized in July 2014 and had cardiac catheterization which showed that her grafts were open. EF normal.  She was hospitalized 11/15/2014 with which he describes as an aching sensation in her chest.  She underwent a treadmill Myoview on 11/17/14 which showed no ischemia and her ejection fraction was 86%.   In June 2019 she had Abdominal US at [facility] showing AAA of 4 cm.   In October 2019 she underwent right rotator cuff repair.  The patient {does/does not:200015} have symptoms concerning for COVID-19 infection (fever, chills, cough, or new shortness of breath).    Past Medical History:  Diagnosis Date  . AAA (abdominal aortic aneurysm) ([place])    a. s/p stent grafting in 2009.  . Adenomatous colon polyp 05/2001  . Adenomatous duodenal polyp   . Allergy   . Anginal pain ([place])   . COPD (chronic obstructive pulmonary disease) ([place])    pt denies COPD  . Coronary artery disease    a. s/p CABG in 1995;  b. 05/2013 Neg MV, EF 82%;  c. 06/2013 Cath: LM nl, LAD 40-50p, LCX nl,  RCA 80p/177m, VG->RCA->PDA->PLSA min irregs, LIMA->LAD atretic, vigorous LV fxn.  . Diabetes mellitus without complication (HCC)    history of, resolved. 2017  . Diverticulosis   . Eczema, dyshidrotic   . Eczema, dyshidrotic 1986   Dr. Mathis Fare  . Emphysema of lung ([place])   . Fibromyalgia   . GERD (gastroesophageal reflux disease)   . Hyperlipidemia   . Hypertension   . Migraine   . Myocardial infarction ([place])   . Osteoarthritis   . Osteoporosis    unsure, having a bone scan soon  . Peripheral arterial disease (H[facility])    left leg diagnosed by Dr Mare Ferrari  . Renal artery stenosis ([place])    a. 2008 s/p PTA   Past Surgical History:  Procedure Laterality Date  . ABDOMINAL AORTAGRAM N/A 03/06/2014   Procedure: ABDOMINAL AORTAGRAM;  Surgeon: Rosetta Posner, MD;  Location[facility] CATH LAB;  Service: Cardiovascular;  Laterality: N/A;  . ABDOMINAL AORTIC ANEURYSM REPAIR  2009   stenting  . ABDOMINAL HYSTERECTOMY     Total, 1992  . BREAST BIOPSY    . CARDIAC CATHETERIZATION    . CARDIOVASCULAR STRESS TEST  11/11/2009   normal study  . CARPAL TUNNEL RELEASE  08/1993   left wrist  . CARPAL TUNNEL RELEASE  01/1997   right  . CORONARY ARTERY BYPASS GRAFT  07/1994   Triple  by Dr Ceasar Mons  . dental implants    . HAND SURGERY Right 09/2002   Right hand pulley release  . LEFT HEART CATHETERIZATION WITH CORONARY/GRAFT ANGIOGRAM N/A 06/11/2013   Procedure: LEFT HEART CATHETERIZATION WITH [name];  Surgeon: Thayer Headings, MD;  Location: [facility] CATH LAB;  Service: Cardiovascular;  Laterality: N/A;  . MULTIPLE TOOTH EXTRACTIONS  2000   All upper teeth.  Has full dneture.   [name] RENAL ARTERY STENT  2008  . SHOULDER ARTHROSCOPY WITH ROTATOR CUFF REPAIR Right 09/27/2018   Procedure: Right shoulder mini open rotator cuff repair;  Surgeon: Susa Day, MD;  Location: [facility] ORS;  Service: Orthopedics;  Laterality: Right;  90 mins  . thyroid cyst apiration  05/1997 and 07/1998  . TONSILECTOMY,  ADENOIDECTOMY, BILATERAL MYRINGOTOMY AND TUBES  1989  . TOTAL ABDOMINAL HYSTERECTOMY  1992     No outpatient medications have been marked as taking for the 01/09/20 encounter (Appointment) with [place], Sadat Sliwa M, MD.     Allergies:   Anticoagulant compound, Morphine and related, Bextra [valdecoxib], Hydrocod polst-cpm polst er, Lipitor [atorvastatin], Mevacor [lovastatin], Plavix  [clopidogrel bisulfate], Zocor [simvastatin], Doxycycline, Metoprolol, Plavix [clopidogrel bisulfate], Codeine, Hydrocod polst-cpm polst er, and Lisinopril   Social History   Tobacco Use  . Smoking status: Former Smoker    Packs/day: 0.00    Years: 37.00    Pack years: 0.00    Types: Cigarettes    Quit date: 12/07/1995    Years since quitting: 24.0  . Smokeless tobacco: Never Used  Substance Use Topics  . Alcohol use: Yes    Alcohol/week: 6.0 - 10.0 standard drinks    Types: 6 - 10 Glasses of wine per week    Comment: 2-3 glasses daily  . Drug use: No     Family Hx: The patient's family history includes AAA (abdominal aortic aneurysm) in her brother; Breast cancer in her sister; Colon cancer in her cousin and paternal grandmother; Diabetes in her brother and sister; Heart attack in her brother, father, mother, and sister; Heart disease in her brother, brother, father, mother, and sister; Hyperlipidemia in her brother and sister; Hypertension in her brother and sister; Liver cancer in her sister. There is no history of Pancreatic cancer, Rectal cancer, Stomach cancer, or Esophageal cancer.  ROS:   Please see the history of present illness.    *** All other systems reviewed and are negative.   Prior CV studies:   The following studies were reviewed today:  ***  Labs/Other Tests and Data Reviewed:    EKG:  {EKG/Telemetry Strips [phone]}  Recent Labs: 01/25/2019: Hemoglobin 14.9; Platelets 197.0; TSH 2.05 07/26/2019: ALT 43; BUN 16; Creatinine, Ser 0.85; Potassium 3.9; Sodium 138   Recent  Lipid Panel Lab Results  Component Value Date/Time   CHOL 153 07/26/2019 08:15 AM   TRIG 241 (H) 07/26/2019 08:15 AM   HDL 44 07/26/2019 08:15 AM   CHOLHDL 3.5 07/26/2019 08:15 AM   CHOLHDL 3 01/25/2019 11:08 AM   LDLCALC 61 07/26/2019 08:15 AM   LDLDIRECT 122.0 01/25/2019 11:08 AM    Wt Readings from Last 3 Encounters:  12/06/19 159 lb (72.1 kg)  11/15/19 159 lb (72.1 kg)  11/07/19 161 lb (73 kg)     Objective:    Vital Signs:  There were no vitals taken for this visit.   {[facility] Virtual Exam (Optional):[id number]::"VITAL SIGNS:  reviewed"}  ASSESSMENT & PLAN:    1.CAD status post CABG in 1995. Most recent cardiac cath in  July 2014 showed no recurrent stenoses and patent grafts. Most recent treadmill Myoview 11/17/14 normal with ejection fraction 86%. She is asymptomatic. She notes that prior stress tests really haven't been very helpful.  2. HTN with hypertensive heart disease. BP is under fairly good control. 3. Hypercholesterolemia- LDL is at goal but triglycerides persistently elevated. Will see if we can get her on Vascepa 4 grams daily. If not she will increase fish oil to 4 gr daily.  4. Abnormal liver function studies with mild transaminase elevation.  Improved.  5. Status post stent to right renal artery 2008. Patent by CT in May 2018.  6. Status post stent graft to abdominal aortic aneurysm 2009.  S/p iliac stent in 2016. Stable by [place] in May 7. Peripheral artery disease followed by [name]    COVID-19 Education: The signs and symptoms of COVID-19 were discussed with the patient and how to seek care for testing (follow up with PCP or arrange E-visit).  ***The importance of social distancing was discussed today.  Time:   Today, I have spent *** minutes with the patient with telehealth technology discussing the above problems.     Medication Adjustments/Labs and Tests Ordered: Current medicines are reviewed at length with the patient today.  Concerns  regarding medicines are outlined above.   Tests Ordered: No orders of the defined types were placed in this encounter.   Medication Changes: No orders of the defined types were placed in this encounter.   Follow Up:  {F/U Format:[phone]} {follow up:15908}  Signed, Khalaya Mcgurn [place], MD  01/01/2020 1:06 PM    [facility]

## 2020-01-02 ENCOUNTER — Encounter: Payer: Self-pay | Admitting: Family Medicine

## 2020-01-02 LAB — HEMOGLOBIN A1C
Est. average glucose Bld gHb Est-mCnc: 134 mg/dL
Hgb A1c MFr Bld: 6.3 % — ABNORMAL HIGH (ref 4.8–5.6)

## 2020-01-08 NOTE — Progress Notes (Signed)
Virtual Visit via Telephone Note   This visit type was conducted due to national recommendations for restrictions regarding the COVID-19 Pandemic (e.g. social distancing) in an effort to limit this patient's exposure and mitigate transmission in our community.  Due to her co-morbid illnesses, this patient is at least at moderate risk for complications without adequate follow up.  This format is felt to be most appropriate for this patient at this time.  The patient did not have access to video technology/had technical difficulties with video requiring transitioning to audio format only (telephone).  All issues noted in this document were discussed and addressed.  No physical exam could be performed with this format.  Please refer to the patient's chart for her  consent to telehealth for [facility].   Date:  01/09/2020   ID:  Megan [name], DOB 03-08-1946, MRN [id number]  Patient Location: Home Provider Location: Home  PCP:  [name], MD  Cardiologist:  [name] MD Electrophysiologist:  None   Evaluation Performed:  Follow-Up Visit  Chief Complaint:  CAD  History of Present Illness:    Megan [name] is a 74 y.o. female with history of of CAD, HTN, HL, and PAD.   She has a past history of essential hypertension and  coronary artery disease. Also has a history of dyslipidemia. She is status post coronary artery bypass graft surgery in 1995. She had a renal artery stent for renal artery stenosis in 2008. She had a stent graft of an abdominal aortic aneurysm 2009. She is s/p stenting of the left iliac. She is followed by [name] for PAD. Last CT in May 2018. She had [place] in June 2019 showing no endoleak.    She was hospitalized in July 2014 and had cardiac catheterization which showed that her grafts were open. EF normal.  She was hospitalized 11/15/2014 with which he describes as an aching sensation in her chest.  She underwent a treadmill Myoview on 11/17/14  which showed no ischemia and her ejection fraction was 86%.   In June 2019 she had Abdominal US at [facility] showing AAA of 4 cm.   In October 2019 she underwent right rotator cuff repair.  On follow up today she is doing well. She is walking regularly. Gets a little breathless walking up hills or steps otherwise no chest pain, claudication or dizziness. She has taken Vascepa for 3 months with no change in triglyceride level. This is quite expensive so she is going back to fish oil.   The patient does not have symptoms concerning for COVID-19 infection (fever, chills, cough, or new shortness of breath).    Past Medical History:  Diagnosis Date  . AAA (abdominal aortic aneurysm) ([place])    a. s/p stent grafting in 2009.  . Adenomatous colon polyp 05/2001  . Adenomatous duodenal polyp   . Allergy   . Anginal pain ([place])   . COPD (chronic obstructive pulmonary disease) ([place])    pt denies COPD  . Coronary artery disease    a. s/p CABG in 1995;  b. 05/2013 Neg MV, EF 82%;  c. 06/2013 Cath: LM nl, LAD 40-50p, LCX nl, RCA 80p/159m, VG->RCA->PDA->PLSA min irregs, LIMA->LAD atretic, vigorous LV fxn.  . Diabetes mellitus without complication (HCC)    history of, resolved. 2017  . Diverticulosis   . Eczema, dyshidrotic   . Eczema, dyshidrotic 1986   Dr. Mathis Fare  . Emphysema of lung ([place])   . Fibromyalgia   . GERD (  gastroesophageal reflux disease)   . Hyperlipidemia   . Hypertension   . Migraine   . Myocardial infarction ([place])   . Osteoarthritis   . Osteoporosis    unsure, having a bone scan soon  . Peripheral arterial disease (HCC)    left leg diagnosed by Dr Mare Ferrari  . Renal artery stenosis ([place])    a. 2008 s/p PTA   Past Surgical History:  Procedure Laterality Date  . ABDOMINAL AORTAGRAM N/A 03/06/2014   Procedure: ABDOMINAL AORTAGRAM;  Surgeon: Rosetta Posner, MD;  Location: [facility] CATH LAB;  Service: Cardiovascular;  Laterality: N/A;  . ABDOMINAL AORTIC ANEURYSM REPAIR  2009   stenting   . ABDOMINAL HYSTERECTOMY     Total, 1992  . BREAST BIOPSY    . CARDIAC CATHETERIZATION    . CARDIOVASCULAR STRESS TEST  11/11/2009   normal study  . CARPAL TUNNEL RELEASE  08/1993   left wrist  . CARPAL TUNNEL RELEASE  01/1997   right  . CORONARY ARTERY BYPASS GRAFT  07/1994   Triple by Dr Ceasar Mons  . dental implants    . HAND SURGERY Right 09/2002   Right hand pulley release  . LEFT HEART CATHETERIZATION WITH CORONARY/GRAFT ANGIOGRAM N/A 06/11/2013   Procedure: LEFT HEART CATHETERIZATION WITH [name];  Surgeon: Thayer Headings, MD;  Location: [facility] CATH LAB;  Service: Cardiovascular;  Laterality: N/A;  . MULTIPLE TOOTH EXTRACTIONS  2000   All upper teeth.  Has full dneture.   [name] RENAL ARTERY STENT  2008  . SHOULDER ARTHROSCOPY WITH ROTATOR CUFF REPAIR Right 09/27/2018   Procedure: Right shoulder mini open rotator cuff repair;  Surgeon: Susa Day, MD;  Location: [facility] ORS;  Service: Orthopedics;  Laterality: Right;  90 mins  . thyroid cyst apiration  05/1997 and 07/1998  . TONSILECTOMY, ADENOIDECTOMY, BILATERAL MYRINGOTOMY AND TUBES  1989  . TOTAL ABDOMINAL HYSTERECTOMY  1992     Current Meds  Medication Sig  . Accu-Chek FastClix Lancets MISC Check blood sugar once daily PRN  . albuterol (PROVENTIL HFA;VENTOLIN HFA) 108 (90 Base) MCG/ACT inhaler Inhale 2 puffs into the lungs every 4 (four) hours as needed for wheezing or shortness of breath.  . ALPRAZolam (XANAX) 0.25 MG tablet Take 1 tablet (0.25 mg total) by mouth at bedtime as needed.  [name] amLODipine (NORVASC) 5 MG tablet Take 1 tablet (5 mg total) by mouth daily.  [name] aspirin 325 MG tablet Take 325 mg by mouth daily.  [name] atorvastatin (LIPITOR) 80 MG tablet Take 1 tablet (80 mg total) by mouth daily.  [name] augmented betamethasone dipropionate (DIPROLENE-AF) 0.05 % cream Apply 1 application topically daily as needed. For hands  . B Complex-Biotin-FA (B-100 COMPLEX PO) B-100 Complex  . ciclopirox (LOPROX) 0.77 % cream Apply 1  application topically daily as needed (hands).   . clobetasol cream (TEMOVATE) AB-123456789 % Apply 1 application topically 2 (two) times daily as needed (hands).   . CORDRAN 4 MCG/SQCM TAPE as needed.  . cycloSPORINE (RESTASIS) 0.05 % ophthalmic emulsion Place 1 drop into both eyes at bedtime.  . gabapentin (NEURONTIN) 300 MG capsule Take 1-2 capsules (300-600 mg total) by mouth 3 (three) times daily.  [name] glucose blood (ACCU-CHEK SMARTVIEW) test strip Check blood sugar once daily PRN  . guaiFENesin-codeine 100-10 MG/5ML syrup Take 5 mLs by mouth every 6 (six) hours as needed for cough.  . hydrALAZINE (APRESOLINE) 10 MG tablet TAKE 1 TABLET BY MOUTH THREE TIMES A DAY  . losartan-hydrochlorothiazide (HYZAAR) 100-25 MG  tablet Take 1 tablet by mouth daily.  . nitroGLYCERIN (NITROSTAT) 0.4 MG SL tablet Place 1 tablet (0.4 mg total) under the tongue every 5 (five) minutes as needed for chest pain.  [name] omeprazole (PRILOSEC) 40 MG capsule TAKE 1 CAPSULE BY MOUTH TWICE A DAY  . potassium chloride (K-DUR) 10 MEQ tablet Take 2 tablets (20 mEq total) by mouth 2 (two) times daily.  [name] torsemide (DEMADEX) 20 MG tablet Take 1/2 tablet daily  . tretinoin (RETIN-A) 0.1 % cream Apply 1 application topically every other day.  . Vitamin D, Ergocalciferol, 2000 units CAPS Take 1 capsule by mouth daily.   . [DISCONTINUED] Icosapent Ethyl (VASCEPA) 0.5 g CAPS Take 4 capsules by mouth 2 (two) times daily.     Allergies:   Anticoagulant compound, Morphine and related, Bextra [valdecoxib], Hydrocod polst-cpm polst er, Lipitor [atorvastatin], Mevacor [lovastatin], Plavix  [clopidogrel bisulfate], Zocor [simvastatin], Doxycycline, Metoprolol, Plavix [clopidogrel bisulfate], Codeine, Hydrocod polst-cpm polst er, and Lisinopril   Social History   Tobacco Use  . Smoking status: Former Smoker    Packs/day: 0.00    Years: 37.00    Pack years: 0.00    Types: Cigarettes    Quit date: 12/07/1995    Years since quitting: 24.1  .  Smokeless tobacco: Never Used  Substance Use Topics  . Alcohol use: Yes    Alcohol/week: 6.0 - 10.0 standard drinks    Types: 6 - 10 Glasses of wine per week    Comment: 2-3 glasses daily  . Drug use: No     Family Hx: The patient's family history includes AAA (abdominal aortic aneurysm) in her brother; Breast cancer in her sister; Colon cancer in her cousin and paternal grandmother; Diabetes in her brother and sister; Heart attack in her brother, father, mother, and sister; Heart disease in her brother, brother, father, mother, and sister; Hyperlipidemia in her brother and sister; Hypertension in her brother and sister; Liver cancer in her sister. There is no history of Pancreatic cancer, Rectal cancer, Stomach cancer, or Esophageal cancer.  ROS:   Please see the history of present illness.     All other systems reviewed and are negative.   Prior CV studies:   The following studies were reviewed today:  None   Labs/Other Tests and Data Reviewed:    EKG:  No ECG reviewed.  Recent Labs: 01/25/2019: Hemoglobin 14.9; Platelets 197.0; TSH 2.05 01/01/2020: ALT 40; BUN 10; Creatinine, Ser 0.76; Potassium 4.3; Sodium 137   Recent Lipid Panel Lab Results  Component Value Date/Time   CHOL 154 01/01/2020 08:35 AM   TRIG 247 (H) 01/01/2020 08:35 AM   HDL 50 01/01/2020 08:35 AM   CHOLHDL 3.1 01/01/2020 08:35 AM   CHOLHDL 3 01/25/2019 11:08 AM   LDLCALC 64 01/01/2020 08:35 AM   LDLDIRECT 122.0 01/25/2019 11:08 AM    Wt Readings from Last 3 Encounters:  01/09/20 157 lb (71.2 kg)  12/06/19 159 lb (72.1 kg)  11/15/19 159 lb (72.1 kg)     Objective:    Vital Signs:  BP 122/71   Pulse 70   Temp (!) 97.1 F (36.2 C)   Ht 5' (1.524 m)   Wt 157 lb (71.2 kg)   BMI 30.66 kg/m    VITAL SIGNS:  reviewed  ASSESSMENT & PLAN:    1.CAD status post CABG in 1995. Most recent cardiac cath in July 2014 showed no recurrent stenoses and patent grafts. Most recent treadmill Myoview  11/17/14 normal with ejection fraction 86%.  She is asymptomatic. Prior stress tests really haven't been very helpful.  2. HTN with hypertensive heart disease. BP is well controlled.  3. Hypercholesterolemia- LDL is at goal but triglycerides persistently elevated. No change on Vascepa. Will go back to fish oil due to cost.  4. Status post stent to right renal artery 2008. Patent by CT in May 2018.  5. Status post stent graft to abdominal aortic aneurysm 2009.  S/p iliac stent in 2016. Stable by [place] in May 6. Peripheral artery disease followed by [name]    COVID-19 Education: The signs and symptoms of COVID-19 were discussed with the patient and how to seek care for testing (follow up with PCP or arrange E-visit).  The importance of social distancing was discussed today.  Time:   Today, I have spent 10 minutes with the patient with telehealth technology discussing the above problems.     Medication Adjustments/Labs and Tests Ordered: Current medicines are reviewed at length with the patient today.  Concerns regarding medicines are outlined above.   Tests Ordered: No orders of the defined types were placed in this encounter.   Medication Changes: No orders of the defined types were placed in this encounter.   Follow Up:  In Person in 6 month(s)  Signed, [name], MD  01/09/2020 9:21 AM    [place]

## 2020-01-09 ENCOUNTER — Encounter: Payer: Self-pay | Admitting: Cardiology

## 2020-01-09 ENCOUNTER — Ambulatory Visit: Payer: Medicare Other | Admitting: Cardiology

## 2020-01-09 ENCOUNTER — Telehealth (INDEPENDENT_AMBULATORY_CARE_PROVIDER_SITE_OTHER): Payer: Medicare Other | Admitting: Cardiology

## 2020-01-09 VITALS — BP 122/71 | HR 70 | Temp 97.1°F | Ht 60.0 in | Wt 157.0 lb

## 2020-01-09 DIAGNOSIS — I25708 Atherosclerosis of coronary artery bypass graft(s), unspecified, with other forms of angina pectoris: Secondary | ICD-10-CM | POA: Diagnosis not present

## 2020-01-09 DIAGNOSIS — I739 Peripheral vascular disease, unspecified: Secondary | ICD-10-CM

## 2020-01-09 DIAGNOSIS — I119 Hypertensive heart disease without heart failure: Secondary | ICD-10-CM

## 2020-01-09 DIAGNOSIS — E782 Mixed hyperlipidemia: Secondary | ICD-10-CM

## 2020-01-09 MED ORDER — NITROGLYCERIN 0.4 MG SL SUBL: 0.4000 mg | SUBLINGUAL_TABLET | SUBLINGUAL | 11 refills | Status: DC | PRN

## 2020-01-09 NOTE — Addendum Note (Signed)
Addended by: Kathyrn Lass on: 01/09/2020 09:51 AM   Modules accepted: Orders

## 2020-01-09 NOTE — Patient Instructions (Signed)
Medication Instructions:  Continue same medications *If you need a refill on your cardiac medications before your next appointment, please call your pharmacy*  Lab Work: None ordered   Testing/Procedure None ordered  Follow-Up: At [facility], you and your health needs are our priority.  As part of our continuing mission to provide you with exceptional heart care, we have created designated Provider Care Teams.  These Care Teams include your primary Cardiologist (physician) and Advanced Practice Providers (APPs -  Physician Assistants and Nurse Practitioners) who all work together to provide you with the care you need, when you need it.  Your next appointment:  6 months   Call in June to schedule August appointment   The format for your next appointment:  Office   Provider:  Dr.Jordan

## 2020-01-12 ENCOUNTER — Ambulatory Visit: Payer: Medicare Other | Attending: Internal Medicine

## 2020-01-12 DIAGNOSIS — Z23 Encounter for immunization: Secondary | ICD-10-CM | POA: Insufficient documentation

## 2020-01-12 NOTE — Progress Notes (Signed)
   Covid-19 Vaccination Clinic  Name:  Megan [name]    MRN: [id number] DOB: 1946/10/24  01/12/2020  [name] was observed post Covid-19 immunization for 15 minutes without incidence. She was provided with Vaccine Information Sheet and instruction to access the V-Safe system.   [name] was instructed to call 911 with any severe reactions post vaccine: [name] Difficulty breathing  . Swelling of your face and throat  . A fast heartbeat  . A bad rash all over your body  . Dizziness and weakness    Immunizations Administered    Name Date Dose VIS Date Route   Pfizer COVID-19 Vaccine 01/12/2020 10:34 AM 0.3 mL 11/16/2019 Intramuscular   Manufacturer: [place]   Lot: [id number]   [place]: [id number]

## 2020-01-17 ENCOUNTER — Other Ambulatory Visit: Payer: Self-pay | Admitting: Family Medicine

## 2020-01-20 ENCOUNTER — Other Ambulatory Visit: Payer: Self-pay | Admitting: Cardiology

## 2020-01-21 ENCOUNTER — Other Ambulatory Visit: Payer: Self-pay

## 2020-01-21 MED ORDER — LOSARTAN POTASSIUM-HCTZ 100-25 MG PO TABS: 1.0000 | ORAL_TABLET | Freq: Every day | ORAL | 3 refills | Status: DC

## 2020-01-22 DIAGNOSIS — M47816 Spondylosis without myelopathy or radiculopathy, lumbar region: Secondary | ICD-10-CM | POA: Diagnosis not present

## 2020-01-22 DIAGNOSIS — H2513 Age-related nuclear cataract, bilateral: Secondary | ICD-10-CM | POA: Diagnosis not present

## 2020-01-22 DIAGNOSIS — H524 Presbyopia: Secondary | ICD-10-CM | POA: Diagnosis not present

## 2020-01-27 ENCOUNTER — Other Ambulatory Visit: Payer: Self-pay | Admitting: Cardiology

## 2020-01-30 ENCOUNTER — Ambulatory Visit: Payer: Medicare Other

## 2020-01-30 ENCOUNTER — Encounter: Payer: Medicare Other | Admitting: Family Medicine

## 2020-02-06 ENCOUNTER — Other Ambulatory Visit: Payer: Self-pay | Admitting: Cardiology

## 2020-02-07 DIAGNOSIS — M47816 Spondylosis without myelopathy or radiculopathy, lumbar region: Secondary | ICD-10-CM | POA: Diagnosis not present

## 2020-02-18 DIAGNOSIS — M48061 Spinal stenosis, lumbar region without neurogenic claudication: Secondary | ICD-10-CM | POA: Diagnosis not present

## 2020-02-18 MED ORDER — TORSEMIDE 20 MG PO TABS: ORAL_TABLET | ORAL | 3 refills | Status: DC

## 2020-02-26 ENCOUNTER — Encounter: Payer: Self-pay | Admitting: Family Medicine

## 2020-02-26 DIAGNOSIS — M48061 Spinal stenosis, lumbar region without neurogenic claudication: Secondary | ICD-10-CM | POA: Diagnosis not present

## 2020-02-26 DIAGNOSIS — I10 Essential (primary) hypertension: Secondary | ICD-10-CM | POA: Diagnosis not present

## 2020-02-26 NOTE — Telephone Encounter (Signed)
Please schedule patient for Doxy visit.  Thank You

## 2020-02-27 ENCOUNTER — Encounter: Payer: Self-pay | Admitting: Family Medicine

## 2020-02-27 ENCOUNTER — Ambulatory Visit (INDEPENDENT_AMBULATORY_CARE_PROVIDER_SITE_OTHER): Payer: Medicare Other | Admitting: Family Medicine

## 2020-02-27 VITALS — Temp 98.7°F | Ht 60.0 in | Wt 160.0 lb

## 2020-02-27 DIAGNOSIS — J441 Chronic obstructive pulmonary disease with (acute) exacerbation: Secondary | ICD-10-CM

## 2020-02-27 MED ORDER — SULFAMETHOXAZOLE-TRIMETHOPRIM 800-160 MG PO TABS: 1.0000 | ORAL_TABLET | Freq: Two times a day (BID) | ORAL | 0 refills | Status: DC

## 2020-02-27 MED ORDER — PREDNISONE 20 MG PO TABS: ORAL_TABLET | ORAL | 0 refills | Status: DC

## 2020-02-27 MED ORDER — BREZTRI AEROSPHERE 160-9-4.8 MCG/ACT IN AERO: 2.0000 | INHALATION_SPRAY | Freq: Two times a day (BID) | RESPIRATORY_TRACT | 5 refills | Status: DC

## 2020-02-27 NOTE — Progress Notes (Signed)
Virtual Visit via Video Note  Subjective  CC:  Chief Complaint  Patient presents with  . Cough    chest congetion and wheezing. Started Saturday. A little SOB. Couphing up yellowish/greenish mucus. Denies fever & HA     I connected with [name] on 02/27/20 at 11:30 AM EDT by a video enabled telemedicine application and verified that I am speaking with the correct person using two identifiers. Location patient: Home Location provider: [facility] [place], Office Persons participating in the virtual visit: Megan [name], [name], MD Serita Sheller, [place] discussed the limitations of evaluation and management by telemedicine and the availability of in person appointments. The patient expressed understanding and agreed to proceed. HPI: Megan [name] is a 74 y.o. female who was contacted today to address the problems listed above in the chief complaint. . 74 yo with multilple medical problems including copd c/o productive cough greenish/yellow in am, increased wheezing with coughing and chest tightness improved with more frequent use of albuterol. No fevers chills, nasal congestion, sinus congestion, loss of taste or smell or GI sxs. She has had 2 similar copd exacerbations over the last 6 months, last being in December (that was a little worse). She is not on a daily inhaler for her copd. She does not see a pulmonologist. She denies cp. No sick contacts.  Assessment  1. COPD with acute exacerbation ([place])      Plan   Copd exacerbation:  Septra ds given change in sputum; pred burst: she is steroid responsive and prn albuterol  Worsening COPD w/ frequent exacerbations: start Breztri bid. Education given.   I discussed the assessment and treatment plan with the patient. The patient was provided an opportunity to ask questions and all were answered. The patient agreed with the plan and demonstrated an understanding of the instructions.     The patient was advised to call back or seek an in-person evaluation if the symptoms worsen or if the condition fails to improve as anticipated. Follow up: prn 06/13/2020  Meds ordered this encounter  Medications  . Budeson-Glycopyrrol-Formoterol (BREZTRI AEROSPHERE) 160-9-4.8 MCG/ACT AERO    Sig: Inhale 2 puffs into the lungs in the morning and at bedtime.    Dispense:  10.7 g    Refill:  5  . sulfamethoxazole-trimethoprim (BACTRIM DS) 800-160 MG tablet    Sig: Take 1 tablet by mouth 2 (two) times daily.    Dispense:  14 tablet    Refill:  0  . predniSONE (DELTASONE) 20 MG tablet    Sig: Take 3 tabs daily for 5 days    Dispense:  15 tablet    Refill:  0      I reviewed the patients updated PMH, FH, and SocHx.    Patient Active Problem List   Diagnosis Date Noted  . COPD with chronic bronchitis ([place]) 09/28/2018    Priority: High  . Essential hypertension 07/10/2018    Priority: High  . IFG (impaired fasting glucose) 11/10/2017    Priority: High  . 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 Coronary artery disease     Priority: High  . Peripheral vascular disease with claudication ([place])     Priority: High  . 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 History of abdominal aortic aneurysm repair 05/18/2011    Priority: High  . Mixed hyperlipidemia  Priority: High  . DJD (degenerative joint disease), lumbosacral 11/10/2017    Priority: Medium  . Fibromyalgia 11/10/2017    Priority: Medium  . GERD (gastroesophageal reflux disease) 11/10/2017    Priority: Medium  . Fatty liver 11/10/2017    Priority: Medium  . 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    Priority: Medium  . History of renal artery stenosis 05/18/2011    Priority: Medium  . Dyshidrotic eczema 11/10/2017    Priority: Low  . Osteopenia 11/10/2017    Priority: Low  . Does use hearing aid 11/10/2017    Priority: Low  . Plantar fasciitis of left  foot 05/30/2019  . Degeneration of lumbar intervertebral disc 02/22/2019  . Right rotator cuff tear 09/27/2018  . Complete rotator cuff tear 09/27/2018   Current Meds  Medication Sig  . Accu-Chek FastClix Lancets MISC Check blood sugar once daily PRN  . albuterol (PROVENTIL HFA;VENTOLIN HFA) 108 (90 Base) MCG/ACT inhaler Inhale 2 puffs into the lungs every 4 (four) hours as needed for wheezing or shortness of breath.  . ALPRAZolam (XANAX) 0.25 MG tablet Take 1 tablet (0.25 mg total) by mouth at bedtime as needed.  [name] amLODipine (NORVASC) 5 MG tablet Take 1 tablet (5 mg total) by mouth daily.  [name] aspirin 325 MG tablet Take 325 mg by mouth daily.  [name] atorvastatin (LIPITOR) 80 MG tablet Take 1 tablet (80 mg total) by mouth daily.  [name] augmented betamethasone dipropionate (DIPROLENE-AF) 0.05 % cream Apply 1 application topically daily as needed. For hands  . B Complex-Biotin-FA (B-100 COMPLEX PO) B-100 Complex  . ciclopirox (LOPROX) 0.77 % cream Apply 1 application topically daily as needed (hands).   . clobetasol cream (TEMOVATE) AB-123456789 % Apply 1 application topically 2 (two) times daily as needed (hands).   . CORDRAN 4 MCG/SQCM TAPE as needed.  . cycloSPORINE (RESTASIS) 0.05 % ophthalmic emulsion Place 1 drop into both eyes at bedtime.  . gabapentin (NEURONTIN) 300 MG capsule Take 1-2 capsules (300-600 mg total) by mouth 3 (three) times daily.  [name] glucose blood (ACCU-CHEK SMARTVIEW) test strip CHECK BLOOD SUGAR ONCE DAILY AS NEEDED  . guaiFENesin-codeine 100-10 MG/5ML syrup Take 5 mLs by mouth every 6 (six) hours as needed for cough.  . hydrALAZINE (APRESOLINE) 10 MG tablet TAKE 1 TABLET BY MOUTH THREE TIMES A DAY  . losartan-hydrochlorothiazide (HYZAAR) 100-25 MG tablet Take 1 tablet by mouth daily.  . nitroGLYCERIN (NITROSTAT) 0.4 MG SL tablet Place 1 tablet (0.4 mg total) under the tongue every 5 (five) minutes as needed for chest pain.  [name] omeprazole (PRILOSEC) 40 MG capsule TAKE 1 CAPSULE BY MOUTH  TWICE A DAY  . potassium chloride (K-DUR) 10 MEQ tablet Take 2 tablets (20 mEq total) by mouth 2 (two) times daily.  [name] torsemide (DEMADEX) 20 MG tablet Take 1/2 tablet daily  . tretinoin (RETIN-A) 0.1 % cream Apply 1 application topically every other day.  . Vitamin D, Ergocalciferol, 2000 units CAPS Take 1 capsule by mouth daily.     Allergies: Patient is allergic to anticoagulant compound; morphine and related; bextra [valdecoxib]; hydrocod polst-cpm polst er; lipitor [atorvastatin]; mevacor [lovastatin]; plavix  [clopidogrel bisulfate]; zocor [simvastatin]; doxycycline; metoprolol; plavix [clopidogrel bisulfate]; codeine; hydrocod polst-cpm polst er; and lisinopril. Family History: Patient family history includes AAA (abdominal aortic aneurysm) in her brother; Breast cancer in her sister; Colon cancer in her cousin and paternal grandmother; Diabetes in her brother and sister; Heart attack in her brother, father, mother, and sister; Heart disease in her brother, brother,  father, mother, and sister; Hyperlipidemia in her brother and sister; Hypertension in her brother and sister; Liver cancer in her sister. Social History:  Patient  reports that she quit smoking about 24 years ago. Her smoking use included cigarettes. She smoked 0.00 packs per day for 37.00 years. She has never used smokeless tobacco. She reports current alcohol use of about 6.0 - 10.0 standard drinks of alcohol per week. She reports that she does not use drugs.  Review of Systems: Constitutional: Negative for fever malaise or anorexia Cardiovascular: negative for chest pain Respiratory: negative for SOB or persistent cough Gastrointestinal: negative for abdominal pain  OBJECTIVE Vitals: Temp 98.7 F (37.1 C) (Oral)   Ht 5' (1.524 m)   Wt 160 lb (72.6 kg)   BMI 31.25 kg/m  General: no acute distress , A&Ox3, no respiratory distress HEENT: no nasal congestion. Speaking easily  [name], MD

## 2020-03-03 ENCOUNTER — Encounter: Payer: Self-pay | Admitting: Family Medicine

## 2020-03-04 ENCOUNTER — Other Ambulatory Visit: Payer: Self-pay

## 2020-03-04 ENCOUNTER — Encounter: Payer: Self-pay | Admitting: Family Medicine

## 2020-03-05 ENCOUNTER — Other Ambulatory Visit: Payer: Self-pay | Admitting: Family Medicine

## 2020-03-05 MED ORDER — GUAIFENESIN-CODEINE 100-10 MG/5ML PO SOLN: 5.0000 mL | Freq: Four times a day (QID) | ORAL | 0 refills | Status: DC | PRN

## 2020-03-06 HISTORY — PX: EYE SURGERY: SHX253

## 2020-03-12 DIAGNOSIS — H25811 Combined forms of age-related cataract, right eye: Secondary | ICD-10-CM | POA: Diagnosis not present

## 2020-03-12 DIAGNOSIS — H2511 Age-related nuclear cataract, right eye: Secondary | ICD-10-CM | POA: Diagnosis not present

## 2020-04-02 DIAGNOSIS — H25812 Combined forms of age-related cataract, left eye: Secondary | ICD-10-CM | POA: Diagnosis not present

## 2020-04-02 DIAGNOSIS — H2512 Age-related nuclear cataract, left eye: Secondary | ICD-10-CM | POA: Diagnosis not present

## 2020-04-21 ENCOUNTER — Other Ambulatory Visit: Payer: Self-pay | Admitting: Cardiology

## 2020-05-05 IMAGING — MG DIGITAL SCREENING BILATERAL MAMMOGRAM WITH TOMO AND CAD
8 series · 8 of 24 positions shown · non-contrast
Comparison: Previous exam(s).

CLINICAL DATA: Screening.

EXAM:
DIGITAL SCREENING BILATERAL MAMMOGRAM WITH TOMO AND CAD

[L MLO synth-2D]
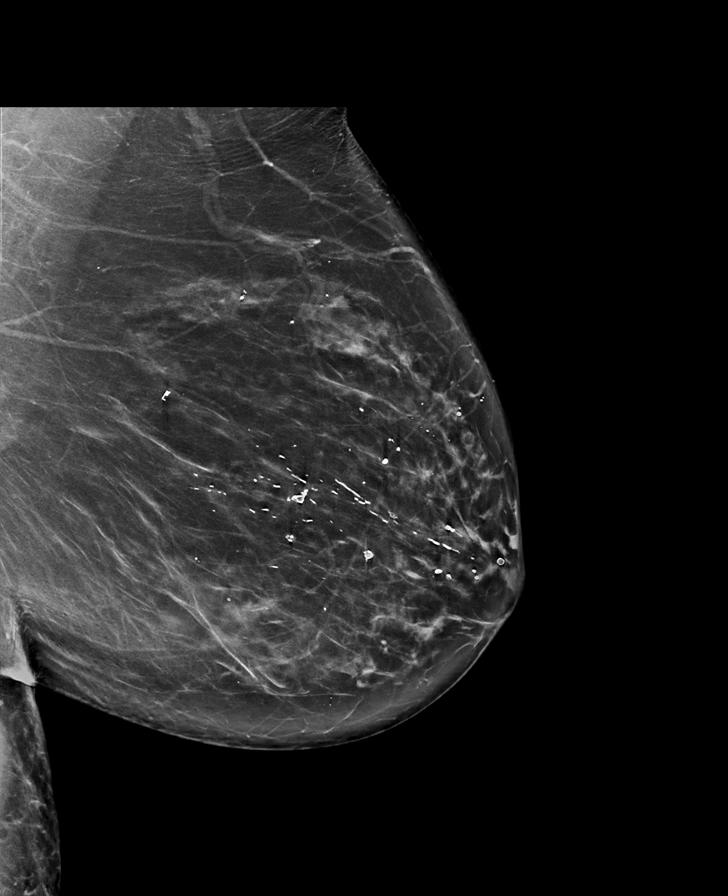

[R CC synth-2D]
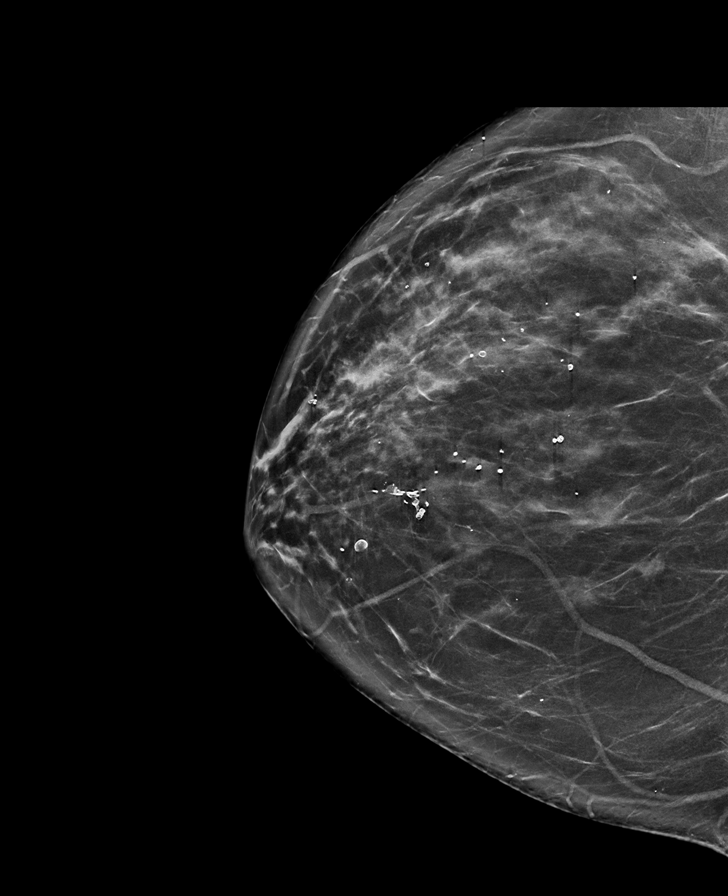

[R MLO synth-2D]
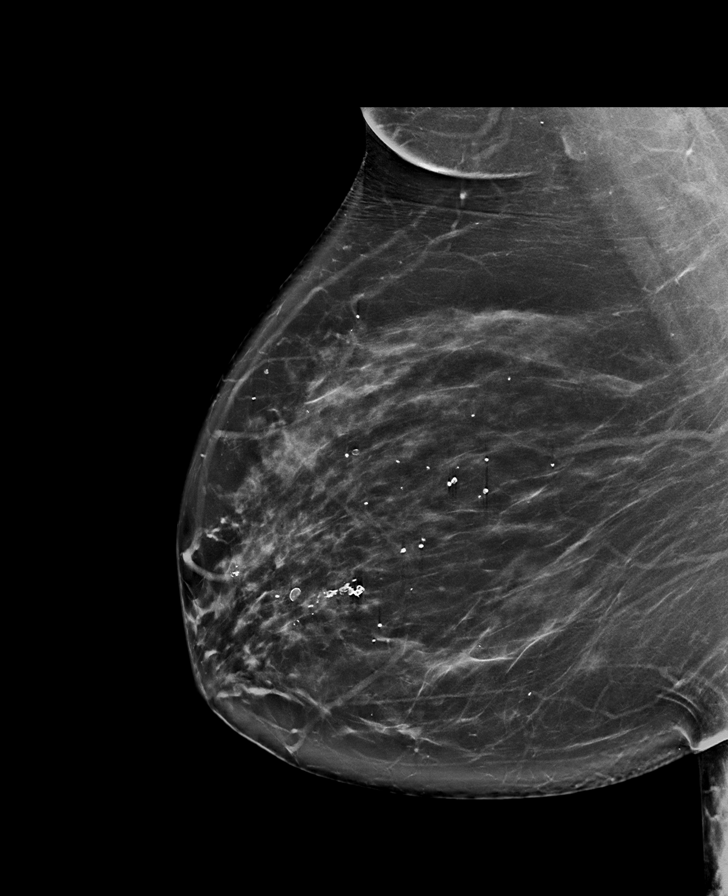

[L CC synth-2D]
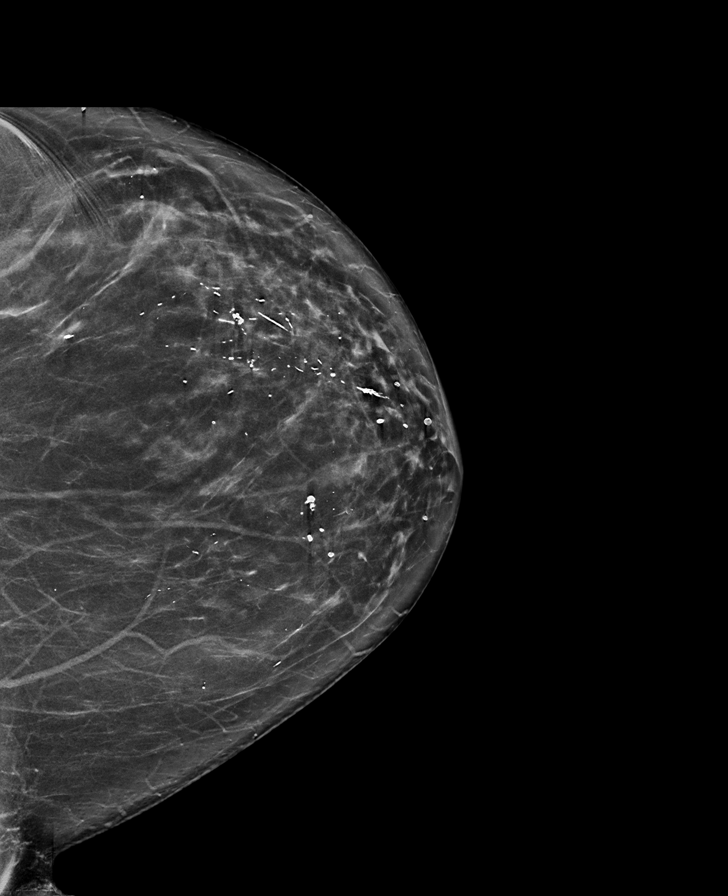

[L MLO tomo · tomo slice 45/89.0]
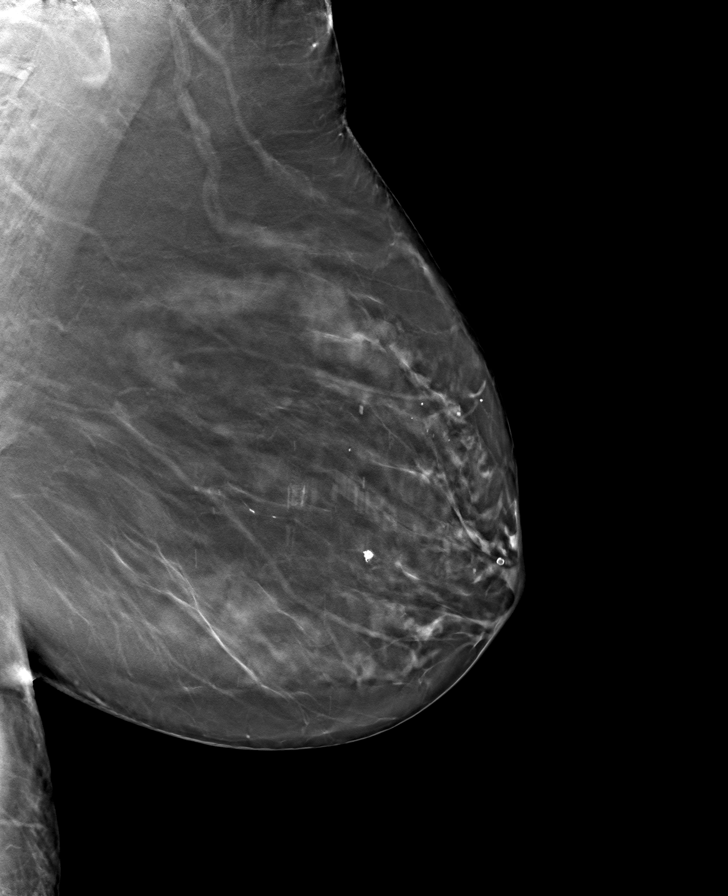

[L CC tomo · tomo slice 41/80.0]
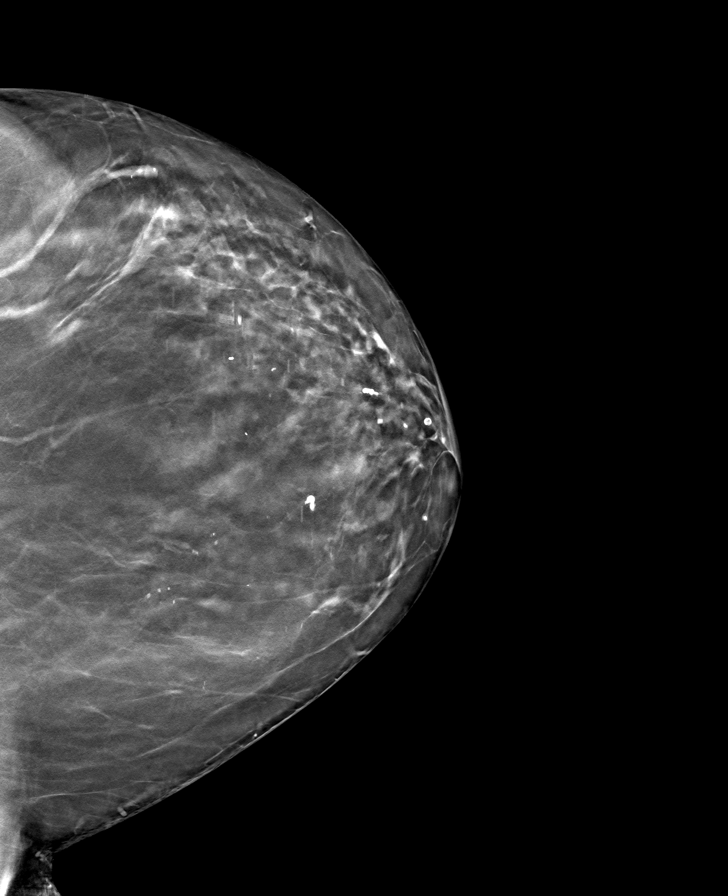

[R MLO tomo · tomo slice 47/92.0]
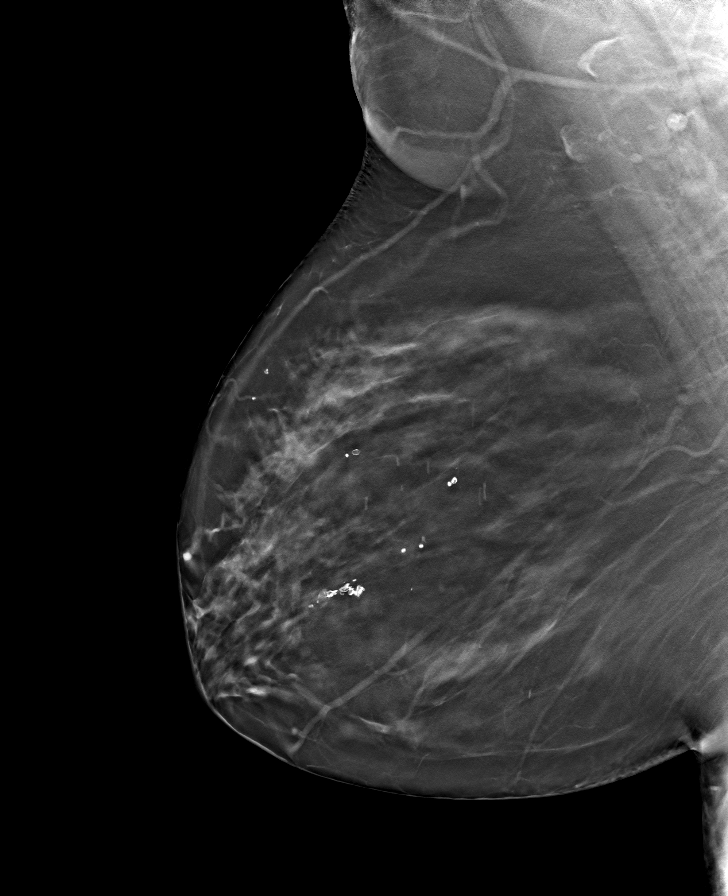

[R CC tomo · tomo slice 40/79.0]
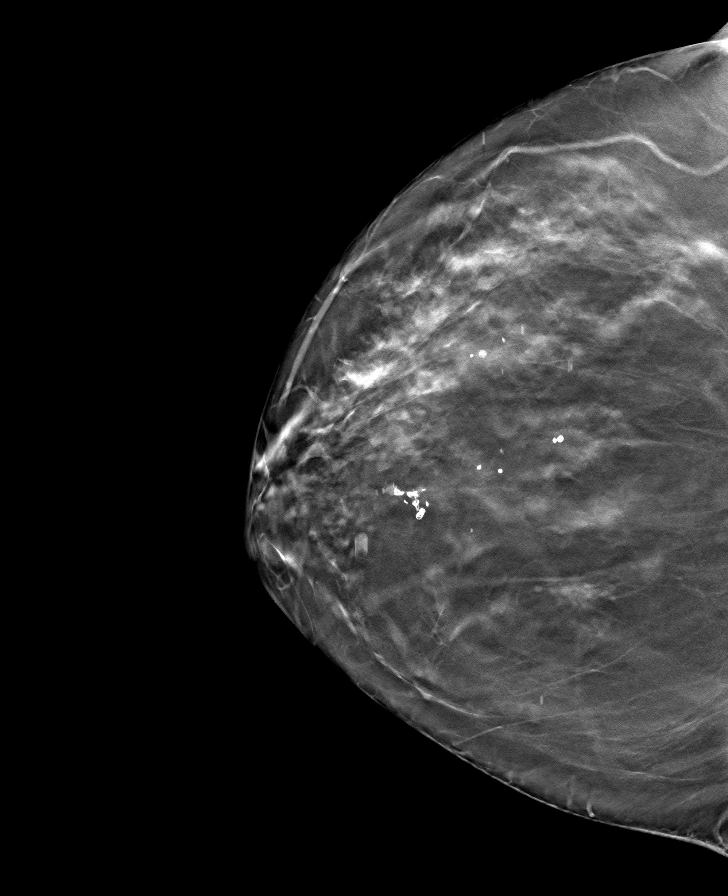

[8 of 24 positions shown; findings below may reference images not displayed]

ACR Breast Density Category b: There are scattered areas of
fibroglandular density.
FINDINGS: There are no findings suspicious for malignancy. Images were
processed with CAD.
IMPRESSION: No mammographic evidence of malignancy. A result letter of this
screening mammogram will be mailed directly to the patient.

RECOMMENDATION:
Screening mammogram in one year. (Code:IV-4-JSM)

BI-RADS CATEGORY  2: Benign.

## 2020-05-06 DIAGNOSIS — M4316 Spondylolisthesis, lumbar region: Secondary | ICD-10-CM | POA: Diagnosis not present

## 2020-05-07 ENCOUNTER — Telehealth: Payer: Self-pay | Admitting: Cardiology

## 2020-05-07 NOTE — Telephone Encounter (Signed)
   [facility] Pre-operative Risk Assessment    HEARTCARE STAFF: - Please ensure there is not already an duplicate clearance open for this procedure. - Under Visit Info/Reason for Call, type in Other and utilize the format Clearance MM/DD/YY or Clearance TBD. Do not use dashes or single digits. - If request is for dental extraction, please clarify the # of teeth to be extracted.  Request for surgical clearance:  1. What type of surgery is being performed? L3-4, L4-5 posterior lumbar interbody fusion   2. When is this surgery scheduled? TBD   3. What type of clearance is required (medical clearance vs. Pharmacy clearance to hold med vs. Both)? Both   4. Are there any medications that need to be held prior to surgery and how long? Aspirin   5. Practice name and name of physician performing surgery? Dr. Sherley Bounds with [facility]   6. What is the office phone number? [phone]    7.   What is the office fax number? [phone]  8.   Anesthesia type (None, local, MAC, general) ? General    Megan [name] 05/07/2020, 11:19 AM  _________________________________________________________________   (provider comments below)

## 2020-05-08 NOTE — Telephone Encounter (Signed)
   Primary Cardiologist:[name], MD  Chart reviewed as part of pre-operative protocol coverage. Because of Virgie Hubel [facility] past medical history and time since last visit, they will require a follow-up visit in order to better assess preoperative cardiovascular risk.  Pre-op covering staff: - Please schedule appointment and call patient to inform them. If patient already had an upcoming appointment within acceptable timeframe, please add "pre-op clearance" to the appointment notes so provider is aware. - Please contact requesting surgeon's office via preferred method (i.e, phone, fax) to inform them of need for appointment prior to surgery.  If applicable, this message will also be routed to pharmacy pool and/or primary cardiologist for input on holding anticoagulant/antiplatelet agent as requested below so that this information is available to the clearing provider at time of patient's appointment.   Please arrange a cardiology followup with Dr. Martinique for preoperative clearance in early July. Please inform the requesting provider as well so they may consider schedule the surgery for mid July.   [place], [place]  05/08/2020, 3:57 PM

## 2020-05-08 NOTE — Telephone Encounter (Signed)
Spoke with pt who is agreeable to see Dr. Martinique on 05/09/20 at 8 am. Pt verbalized understanding and thanked me for the call. Will fax over recommendations to requesting surgeon's office.

## 2020-05-09 ENCOUNTER — Other Ambulatory Visit: Payer: Self-pay

## 2020-05-09 ENCOUNTER — Encounter: Payer: Self-pay | Admitting: Cardiology

## 2020-05-09 ENCOUNTER — Ambulatory Visit: Payer: Medicare Other | Admitting: Cardiology

## 2020-05-09 VITALS — BP 132/62 | HR 71 | Ht 60.0 in | Wt 152.6 lb

## 2020-05-09 DIAGNOSIS — I739 Peripheral vascular disease, unspecified: Secondary | ICD-10-CM | POA: Diagnosis not present

## 2020-05-09 DIAGNOSIS — I25708 Atherosclerosis of coronary artery bypass graft(s), unspecified, with other forms of angina pectoris: Secondary | ICD-10-CM

## 2020-05-09 DIAGNOSIS — E782 Mixed hyperlipidemia: Secondary | ICD-10-CM

## 2020-05-09 DIAGNOSIS — I714 Abdominal aortic aneurysm, without rupture, unspecified: Secondary | ICD-10-CM

## 2020-05-09 DIAGNOSIS — I119 Hypertensive heart disease without heart failure: Secondary | ICD-10-CM

## 2020-05-09 MED ORDER — ASPIRIN EC 81 MG PO TBEC: 81.0000 mg | DELAYED_RELEASE_TABLET | Freq: Every day | ORAL | 3 refills | Status: AC

## 2020-05-09 NOTE — Progress Notes (Signed)
Cardiology Office Note   Date:  05/09/2020   ID:  Megan [name], DOB 04-23-1946, MRN [id number]  PCP:  [name], MD  Cardiologist: Royann Wildasin Martinique MD  Chief Complaint  Patient presents with  . Medical Clearance    Back surgery in July      History of Present Illness: Megan [name] is a 74 y.o. female who is seen for pre op clearance for lumbar fusion with Dr Sherley Bounds. She has a history  of CAD, HTN, HL, and PAD.   She has a past history of essential hypertension and  coronary artery disease. Also has a history of dyslipidemia. She is status post coronary artery bypass graft surgery in 1995. She had a renal artery stent for renal artery stenosis in 2008. She had a stent graft of an abdominal aortic aneurysm 2009. She is s/p stenting of the left iliac. She is followed by [name] for PAD. Last CT in May 2018. She had [place] in June 2019 showing no endoleak.    She was hospitalized in July 2014 and had cardiac catheterization which showed that her grafts were open. EF normal.  She was hospitalized 11/15/2014 with which he describes as an aching sensation in her chest.  She underwent a treadmill Myoview on 11/17/14 which showed no ischemia and her ejection fraction was 86%.   In June 2019 she had Abdominal US at [facility] showing AAA of 4 cm.   In October 2019 she underwent right rotator cuff repair. No complications.   Recently she had bilateral cataract surgery.  She has been having progressive issues with her low back that limits her activity and causes pain. She has exhausted conservative measures and lumbar fusion is planned by Dr Ronnald Ramp.  She states she is doing well from a cardiac standpoint. She can walk 2 blocks and up stairs but has to go slow due to her back. She notes she gets winded walking up hills but this has not changed in years. No chest pain or palpitations. No claudication.   Past Medical History:  Diagnosis Date  . AAA (abdominal aortic aneurysm)  ([place])    a. s/p stent grafting in 2009.  . Adenomatous colon polyp 05/2001  . Adenomatous duodenal polyp   . Allergy   . Anginal pain ([place])   . COPD (chronic obstructive pulmonary disease) ([place])    pt denies COPD  . Coronary artery disease    a. s/p CABG in 1995;  b. 05/2013 Neg MV, EF 82%;  c. 06/2013 Cath: LM nl, LAD 40-50p, LCX nl, RCA 80p/152m, VG->RCA->PDA->PLSA min irregs, LIMA->LAD atretic, vigorous LV fxn.  . Diabetes mellitus without complication (HCC)    history of, resolved. 2017  . Diverticulosis   . Eczema, dyshidrotic   . Eczema, dyshidrotic 1986   Dr. Mathis Fare  . Emphysema of lung ([place])   . Fibromyalgia   . GERD (gastroesophageal reflux disease)   . Hyperlipidemia   . Hypertension   . Migraine   . Myocardial infarction ([place])   . Osteoarthritis   . Osteoporosis    unsure, having a bone scan soon  . Peripheral arterial disease (HCC)    left leg diagnosed by Dr Mare Ferrari  . Renal artery stenosis ([place])    a. 2008 s/p PTA    Past Surgical History:  Procedure Laterality Date  . ABDOMINAL AORTAGRAM N/A 03/06/2014   Procedure: ABDOMINAL AORTAGRAM;  Surgeon: Rosetta Posner, MD;  Location: [facility] CATH LAB;  Service:  Cardiovascular;  Laterality: N/A;  . ABDOMINAL AORTIC ANEURYSM REPAIR  2009   stenting  . ABDOMINAL HYSTERECTOMY     Total, 1992  . BREAST BIOPSY    . CARDIAC CATHETERIZATION    . CARDIOVASCULAR STRESS TEST  11/11/2009   normal study  . CARPAL TUNNEL RELEASE  08/1993   left wrist  . CARPAL TUNNEL RELEASE  01/1997   right  . CATARACT EXTRACTION, BILATERAL  2021  . CORONARY ARTERY BYPASS GRAFT  07/1994   Triple by Dr Ceasar Mons  . dental implants    . HAND SURGERY Right 09/2002   Right hand pulley release  . LEFT HEART CATHETERIZATION WITH CORONARY/GRAFT ANGIOGRAM N/A 06/11/2013   Procedure: LEFT HEART CATHETERIZATION WITH [name];  Surgeon: Thayer Headings, MD;  Location: [facility] CATH LAB;  Service: Cardiovascular;  Laterality: N/A;  . MULTIPLE  TOOTH EXTRACTIONS  2000   All upper teeth.  Has full dneture.   [name] RENAL ARTERY STENT  2008  . SHOULDER ARTHROSCOPY WITH ROTATOR CUFF REPAIR Right 09/27/2018   Procedure: Right shoulder mini open rotator cuff repair;  Surgeon: Susa Day, MD;  Location: [facility] ORS;  Service: Orthopedics;  Laterality: Right;  90 mins  . thyroid cyst apiration  05/1997 and 07/1998  . TONSILECTOMY, ADENOIDECTOMY, BILATERAL MYRINGOTOMY AND TUBES  1989  . TOTAL ABDOMINAL HYSTERECTOMY  1992     Current Outpatient Medications  Medication Sig Dispense Refill  . Accu-Chek FastClix Lancets MISC Check blood sugar once daily PRN 100 each 1  . albuterol (PROVENTIL HFA;VENTOLIN HFA) 108 (90 Base) MCG/ACT inhaler Inhale 2 puffs into the lungs every 4 (four) hours as needed for wheezing or shortness of breath. 1 Inhaler 2  . ALPRAZolam (XANAX) 0.25 MG tablet Take 1 tablet (0.25 mg total) by mouth at bedtime as needed. 90 tablet 1  . amLODipine (NORVASC) 5 MG tablet Take 1 tablet (5 mg total) by mouth daily. 90 tablet 1  . atorvastatin (LIPITOR) 80 MG tablet Take 1 tablet (80 mg total) by mouth daily. 90 tablet 3  . augmented betamethasone dipropionate (DIPROLENE-AF) 0.05 % cream Apply 1 application topically daily as needed. For hands  1  . B Complex-Biotin-FA (B-100 COMPLEX PO) B-100 Complex    . Budeson-Glycopyrrol-Formoterol (BREZTRI AEROSPHERE) 160-9-4.8 MCG/ACT AERO Inhale 2 puffs into the lungs in the morning and at bedtime. 10.7 g 5  . ciclopirox (LOPROX) 0.77 % cream Apply 1 application topically daily as needed (hands).     . clobetasol cream (TEMOVATE) 3.22 % Apply 1 application topically 2 (two) times daily as needed (hands).     . CORDRAN 4 MCG/SQCM TAPE as needed.    . cycloSPORINE (RESTASIS) 0.05 % ophthalmic emulsion Place 1 drop into both eyes at bedtime.    . gabapentin (NEURONTIN) 300 MG capsule Take 1-2 capsules (300-600 mg total) by mouth 3 (three) times daily. 540 capsule 4  . glucose blood (ACCU-CHEK  SMARTVIEW) test strip CHECK BLOOD SUGAR ONCE DAILY AS NEEDED 100 strip 1  . guaiFENesin-codeine 100-10 MG/5ML syrup Take 5 mLs by mouth every 6 (six) hours as needed for cough. 120 mL 0  . hydrALAZINE (APRESOLINE) 10 MG tablet TAKE 1 TABLET BY MOUTH THREE TIMES A DAY 270 tablet 3  . losartan-hydrochlorothiazide (HYZAAR) 100-25 MG tablet Take 1 tablet by mouth daily. 90 tablet 3  . nitroGLYCERIN (NITROSTAT) 0.4 MG SL tablet Place 1 tablet (0.4 mg total) under the tongue every 5 (five) minutes as needed for chest pain. 25 tablet  11  . omeprazole (PRILOSEC) 40 MG capsule TAKE 1 CAPSULE BY MOUTH TWICE A DAY 60 capsule 11  . potassium chloride (KLOR-CON) 10 MEQ tablet TAKE 2 TABLETS (20 MEQ TOTAL) BY MOUTH 2 (TWO) TIMES DAILY. 360 tablet 3  . predniSONE (DELTASONE) 20 MG tablet Take 3 tabs daily for 5 days 15 tablet 0  . sulfamethoxazole-trimethoprim (BACTRIM DS) 800-160 MG tablet Take 1 tablet by mouth 2 (two) times daily. 14 tablet 0  . torsemide (DEMADEX) 20 MG tablet Take 1/2 tablet daily 45 tablet 3  . tretinoin (RETIN-A) 0.1 % cream Apply 1 application topically every other day.    . Vitamin D, Ergocalciferol, 2000 units CAPS Take 1 capsule by mouth daily.      No current facility-administered medications for this visit.    Allergies:   Anticoagulant compound, Morphine and related, Bextra [valdecoxib], Hydrocod polst-cpm polst er, Lipitor [atorvastatin], Mevacor [lovastatin], Plavix  [clopidogrel bisulfate], Zocor [simvastatin], Doxycycline, Metoprolol, Plavix [clopidogrel bisulfate], Codeine, Hydrocod polst-cpm polst er, and Lisinopril    Social History:  The patient  reports that she quit smoking about 24 years ago. Her smoking use included cigarettes. She smoked 0.00 packs per day for 37.00 years. She has never used smokeless tobacco. She reports current alcohol use of about 6.0 - 10.0 standard drinks of alcohol per week. She reports that she does not use drugs.   Family History:  The  patient's family history includes AAA (abdominal aortic aneurysm) in her brother; Breast cancer in her sister; Colon cancer in her cousin and paternal grandmother; Diabetes in her brother and sister; Heart attack in her brother, father, mother, and sister; Heart disease in her brother, brother, father, mother, and sister; Hyperlipidemia in her brother and sister; Hypertension in her brother and sister; Liver cancer in her sister.    ROS:  Please see the history of present illness.   Otherwise, review of systems are positive for none.   All other systems are reviewed and negative.    PHYSICAL EXAM: VS:  BP 132/62   Pulse 71   Ht 5' (1.524 m)   Wt 152 lb 9.6 oz (69.2 kg)   BMI 29.80 kg/m  , BMI Body mass index is 29.8 kg/m. GENERAL:  Overweight  WF in NAD HEENT:  PERRL, EOMI, sclera are clear. Oropharynx is clear. NECK:  No jugular venous distention, carotid upstroke brisk and symmetric, no bruits, no thyromegaly or adenopathy LUNGS:  Clear to auscultation bilaterally CHEST:  Unremarkable HEART:  RRR,  PMI not displaced or sustained,S1 and S2 within normal limits, no S3, no S4: no clicks, no rubs, no murmurs ABD:  Soft, nontender. BS +, no masses or bruits. No hepatomegaly, no splenomegaly EXT:  1 + pulses throughout, no edema, no cyanosis no clubbing SKIN:  Warm and dry.  No rashes NEURO:  Alert and oriented x 3. Cranial nerves II through XII intact. PSYCH:  Cognitively intact     EKG:  EKG is  done today. NSR with normal Ecg. I have personally reviewed and interpreted this study.    Recent Labs: 01/01/2020: ALT 40; BUN 10; Creatinine, Ser 0.76; Potassium 4.3; Sodium 137   Labs from primary care 12/15/16: A1c 5.6%. : Cholesterol 167 Trig-209, HDL 52, LDL 73.  AST 51, ALT 65. Normal renal indices. TSH is normal. Dated 06/24/17: CRP <0.3. Glucose 120, AST 58, ALT 80, other chemistries normal.  Dated 09/28/18: normal CBC, CMET  Lipid Panel    Component Value Date/Time   CHOL 154  01/01/2020 0835   TRIG 247 (H) 01/01/2020 0835   HDL 50 01/01/2020 0835   CHOLHDL 3.1 01/01/2020 0835   CHOLHDL 3 01/25/2019 1108   VLDL 43.6 (H) 01/25/2019 1108   LDLCALC 64 01/01/2020 0835   LDLDIRECT 122.0 01/25/2019 1108      Wt Readings from Last 3 Encounters:  05/09/20 152 lb 9.6 oz (69.2 kg)  02/27/20 160 lb (72.6 kg)  01/09/20 157 lb (71.2 kg)      ASSESSMENT AND PLAN:  1.CAD status post CABG in 1995. Most recent cardiac cath in July 2014 showed no recurrent stenoses and patent grafts. Most recent treadmill Myoview 11/17/14 normal with ejection fraction 86%. She has no significant symptoms of angina. No CHF. She notes that prior stress tests really haven't been very helpful. Given clinical stability I think her operative risk for lumbar surgery is low to moderate based on age and CV disease.  2. HTN with hypertensive heart disease. BP is under  good control. 3. Hypercholesterolemia- LDL is at goal but triglycerides persistently elevated. Np changed with Vascepa and she states this cost a fortune.  4.  Status post stent to right renal artery 2008. Patent by CT in May 2018.  5. Status post stent graft to abdominal aortic aneurysm 2009.  S/p iliac stent in 2016. Stable by [place] in May 6. Peripheral artery disease followed by [name] 7. Lumbar spine disease. OK to proceed with surgery.   Signed, Quatavious Rossa Martinique MD, [facility]    05/09/2020 8:23 AM    [place]

## 2020-05-12 ENCOUNTER — Other Ambulatory Visit: Payer: Self-pay | Admitting: Neurological Surgery

## 2020-05-20 DIAGNOSIS — M4316 Spondylolisthesis, lumbar region: Secondary | ICD-10-CM | POA: Diagnosis not present

## 2020-06-11 ENCOUNTER — Other Ambulatory Visit: Payer: Self-pay | Admitting: Cardiology

## 2020-06-13 ENCOUNTER — Ambulatory Visit (INDEPENDENT_AMBULATORY_CARE_PROVIDER_SITE_OTHER): Payer: Medicare Other | Admitting: Family Medicine

## 2020-06-13 ENCOUNTER — Encounter: Payer: Self-pay | Admitting: Family Medicine

## 2020-06-13 ENCOUNTER — Other Ambulatory Visit: Payer: Self-pay

## 2020-06-13 ENCOUNTER — Ambulatory Visit (INDEPENDENT_AMBULATORY_CARE_PROVIDER_SITE_OTHER): Payer: Medicare Other

## 2020-06-13 VITALS — BP 128/76 | HR 63 | Temp 98.0°F | Resp 18 | Ht 60.0 in | Wt 152.8 lb

## 2020-06-13 DIAGNOSIS — K760 Fatty (change of) liver, not elsewhere classified: Secondary | ICD-10-CM

## 2020-06-13 DIAGNOSIS — J449 Chronic obstructive pulmonary disease, unspecified: Secondary | ICD-10-CM

## 2020-06-13 DIAGNOSIS — I10 Essential (primary) hypertension: Secondary | ICD-10-CM | POA: Diagnosis not present

## 2020-06-13 DIAGNOSIS — M797 Fibromyalgia: Secondary | ICD-10-CM

## 2020-06-13 DIAGNOSIS — M47817 Spondylosis without myelopathy or radiculopathy, lumbosacral region: Secondary | ICD-10-CM

## 2020-06-13 DIAGNOSIS — I739 Peripheral vascular disease, unspecified: Secondary | ICD-10-CM

## 2020-06-13 DIAGNOSIS — R7301 Impaired fasting glucose: Secondary | ICD-10-CM

## 2020-06-13 DIAGNOSIS — Z0000 Encounter for general adult medical examination without abnormal findings: Secondary | ICD-10-CM

## 2020-06-13 DIAGNOSIS — E782 Mixed hyperlipidemia: Secondary | ICD-10-CM

## 2020-06-13 DIAGNOSIS — J4489 Other specified chronic obstructive pulmonary disease: Secondary | ICD-10-CM

## 2020-06-13 LAB — COMPREHENSIVE METABOLIC PANEL
ALT: 26 U/L (ref 0–35)
AST: 26 U/L (ref 0–37)
Albumin: 4.2 g/dL (ref 3.5–5.2)
Alkaline Phosphatase: 68 U/L (ref 39–117)
BUN: 11 mg/dL (ref 6–23)
CO2: 29 mEq/L (ref 19–32)
Calcium: 10.2 mg/dL (ref 8.4–10.5)
Chloride: 100 mEq/L (ref 96–112)
Creatinine, Ser: 0.73 mg/dL (ref 0.40–1.20)
GFR: 77.55 mL/min (ref >60.00)
Glucose, Bld: 108 mg/dL — ABNORMAL HIGH (ref 70–99)
Potassium: 3.6 mEq/L (ref 3.5–5.1)
Sodium: 138 mEq/L (ref 135–145)
Total Bilirubin: 0.6 mg/dL (ref 0.2–1.2)
Total Protein: 6 g/dL (ref 6.0–8.3)

## 2020-06-13 LAB — LIPID PANEL
Cholesterol: 160 mg/dL (ref 0–200)
HDL: 51.1 mg/dL (ref >39.00)
NonHDL: 108.69
Total CHOL/HDL Ratio: 3
Triglycerides: 294 mg/dL — ABNORMAL HIGH (ref 0.0–149.0)
VLDL: 58.8 mg/dL — ABNORMAL HIGH (ref 0.0–40.0)

## 2020-06-13 LAB — CBC WITH DIFFERENTIAL/PLATELET
Basophils Absolute: 0.1 10*3/uL (ref 0.0–0.1)
Basophils Relative: 0.5 % (ref 0.0–3.0)
Eosinophils Absolute: 0.3 10*3/uL (ref 0.0–0.7)
Eosinophils Relative: 3.5 % (ref 0.0–5.0)
HCT: 43.2 % (ref 36.0–46.0)
Hemoglobin: 14.8 g/dL (ref 12.0–15.0)
Lymphocytes Relative: 28.1 % (ref 12.0–46.0)
Lymphs Abs: 2.6 10*3/uL (ref 0.7–4.0)
MCHC: 34.3 g/dL (ref 30.0–36.0)
MCV: 87.7 fl (ref 78.0–100.0)
Monocytes Absolute: 0.7 10*3/uL (ref 0.1–1.0)
Monocytes Relative: 7.9 % (ref 3.0–12.0)
Neutro Abs: 5.6 10*3/uL (ref 1.4–7.7)
Neutrophils Relative %: 60 % (ref 43.0–77.0)
Platelets: 234 10*3/uL (ref 150.0–400.0)
RBC: 4.93 Mil/uL (ref 3.87–5.11)
RDW: 13.8 % (ref 11.5–15.5)
WBC: 9.4 10*3/uL (ref 4.0–10.5)

## 2020-06-13 LAB — LDL CHOLESTEROL, DIRECT: Direct LDL: 82 mg/dL

## 2020-06-13 LAB — POCT GLYCOSYLATED HEMOGLOBIN (HGB A1C): Hemoglobin A1C: 5.9 % — AB (ref 4.0–5.6)

## 2020-06-13 LAB — TSH: TSH: 1.81 u[IU]/mL (ref 0.35–4.50)

## 2020-06-13 NOTE — Patient Instructions (Addendum)
Megan [name] , Thank you for taking time to come for your Medicare Wellness Visit. I appreciate your ongoing commitment to your health goals. Please review the following plan we discussed and let me know if I can assist you in the future.   Screening recommendations/referrals: Colonoscopy: Done 11/05/19 Mammogram: Done 09/03/19 Bone Density: Done 11/08/19 Recommended yearly ophthalmology/optometry visit for glaucoma screening and checkup Recommended yearly dental visit for hygiene and checkup  Vaccinations: Influenza vaccine: Up to date Pneumococcal vaccine: Up to date Tdap vaccine: Up to date Shingles vaccine: Completed  2/8 & 03/14/18 Covid-19:Completed 1/16 & 01/12/20  Advanced directives: Copy in chart  Conditions/risks identified: Stay healthy  Next appointment: Follow up in one year for your annual wellness visit     Preventive Care 78 Years and Older, Female Preventive care refers to lifestyle choices and visits with your health care provider that can promote health and wellness. What does preventive care include?  A yearly physical exam. This is also called an annual well check.  Dental exams once or twice a year.  Routine eye exams. Ask your health care provider how often you should have your eyes checked.  Personal lifestyle choices, including:  Daily care of your teeth and gums.  Regular physical activity.  Eating a healthy diet.  Avoiding tobacco and drug use.  Limiting alcohol use.  Practicing safe sex.  Taking low-dose aspirin every day.  Taking vitamin and mineral supplements as recommended by your health care provider. What happens during an annual well check? The services and screenings done by your health care provider during your annual well check will depend on your age, overall health, lifestyle risk factors, and family history of disease. Counseling  Your health care provider may ask you questions about your:  Alcohol use.  Tobacco use.  Drug  use.  Emotional well-being.  Home and relationship well-being.  Sexual activity.  Eating habits.  History of falls.  Memory and ability to understand (cognition).  Work and work Statistician.  Reproductive health. Screening  You may have the following tests or measurements:  Height, weight, and BMI.  Blood pressure.  Lipid and cholesterol levels. These may be checked every 5 years, or more frequently if you are over 56 years old.  Skin check.  Lung cancer screening. You may have this screening every year starting at age 56 if you have a 30-pack-year history of smoking and currently smoke or have quit within the past 15 years.  Fecal occult blood test (FOBT) of the stool. You may have this test every year starting at age 53.  Flexible sigmoidoscopy or colonoscopy. You may have a sigmoidoscopy every 5 years or a colonoscopy every 10 years starting at age 106.  Hepatitis C blood test.  Hepatitis B blood test.  Sexually transmitted disease (STD) testing.  Diabetes screening. This is done by checking your blood sugar (glucose) after you have not eaten for a while (fasting). You may have this done every 1-3 years.  Bone density scan. This is done to screen for osteoporosis. You may have this done starting at age 58.  Mammogram. This may be done every 1-2 years. Talk to your health care provider about how often you should have regular mammograms. Talk with your health care provider about your test results, treatment options, and if necessary, the need for more tests. Vaccines  Your health care provider may recommend certain vaccines, such as:  Influenza vaccine. This is recommended every year.  Tetanus, diphtheria, and acellular pertussis (  Tdap, Td) vaccine. You may need a Td booster every 10 years.  Zoster vaccine. You may need this after age 42.  Pneumococcal 13-valent conjugate (PCV13) vaccine. One dose is recommended after age 33.  Pneumococcal polysaccharide  (PPSV23) vaccine. One dose is recommended after age 34. Talk to your health care provider about which screenings and vaccines you need and how often you need them. This information is not intended to replace advice given to you by your health care provider. Make sure you discuss any questions you have with your health care provider. Document Released: 12/19/2015 Document Revised: 08/11/2016 Document Reviewed: 09/23/2015 Elsevier Interactive Patient Education  2017 [place] Prevention in the Home Falls can cause injuries. They can happen to people of all ages. There are many things you can do to make your home safe and to help prevent falls. What can I do on the outside of my home?  Regularly fix the edges of walkways and driveways and fix any cracks.  Remove anything that might make you trip as you walk through a door, such as a raised step or threshold.  Trim any bushes or trees on the path to your home.  Use bright outdoor lighting.  Clear any walking paths of anything that might make someone trip, such as rocks or tools.  Regularly check to see if handrails are loose or broken. Make sure that both sides of any steps have handrails.  Any raised decks and porches should have guardrails on the edges.  Have any leaves, snow, or ice cleared regularly.  Use sand or salt on walking paths during winter.  Clean up any spills in your garage right away. This includes oil or grease spills. What can I do in the bathroom?  Use night lights.  Install grab bars by the toilet and in the tub and shower. Do not use towel bars as grab bars.  Use non-skid mats or decals in the tub or shower.  If you need to sit down in the shower, use a plastic, non-slip stool.  Keep the floor dry. Clean up any water that spills on the floor as soon as it happens.  Remove soap buildup in the tub or shower regularly.  Attach bath mats securely with double-sided non-slip rug tape.  Do not have  throw rugs and other things on the floor that can make you trip. What can I do in the bedroom?  Use night lights.  Make sure that you have a light by your bed that is easy to reach.  Do not use any sheets or blankets that are too big for your bed. They should not hang down onto the floor.  Have a firm chair that has side arms. You can use this for support while you get dressed.  Do not have throw rugs and other things on the floor that can make you trip. What can I do in the kitchen?  Clean up any spills right away.  Avoid walking on wet floors.  Keep items that you use a lot in easy-to-reach places.  If you need to reach something above you, use a strong step stool that has a grab bar.  Keep electrical cords out of the way.  Do not use floor polish or wax that makes floors slippery. If you must use wax, use non-skid floor wax.  Do not have throw rugs and other things on the floor that can make you trip. What can I do with my stairs?  Do  not leave any items on the stairs.  Make sure that there are handrails on both sides of the stairs and use them. Fix handrails that are broken or loose. Make sure that handrails are as long as the stairways.  Check any carpeting to make sure that it is firmly attached to the stairs. Fix any carpet that is loose or worn.  Avoid having throw rugs at the top or bottom of the stairs. If you do have throw rugs, attach them to the floor with carpet tape.  Make sure that you have a light switch at the top of the stairs and the bottom of the stairs. If you do not have them, ask someone to add them for you. What else can I do to help prevent falls?  Wear shoes that:  Do not have high heels.  Have rubber bottoms.  Are comfortable and fit you well.  Are closed at the toe. Do not wear sandals.  If you use a stepladder:  Make sure that it is fully opened. Do not climb a closed stepladder.  Make sure that both sides of the stepladder are  locked into place.  Ask someone to hold it for you, if possible.  Clearly mark and make sure that you can see:  Any grab bars or handrails.  First and last steps.  Where the edge of each step is.  Use tools that help you move around (mobility aids) if they are needed. These include:  Canes.  Walkers.  Scooters.  Crutches.  Turn on the lights when you go into a dark area. Replace any light bulbs as soon as they burn out.  Set up your furniture so you have a clear path. Avoid moving your furniture around.  If any of your floors are uneven, fix them.  If there are any pets around you, be aware of where they are.  Review your medicines with your doctor. Some medicines can make you feel dizzy. This can increase your chance of falling. Ask your doctor what other things that you can do to help prevent falls. This information is not intended to replace advice given to you by your health care provider. Make sure you discuss any questions you have with your health care provider. Document Released: 09/18/2009 Document Revised: 04/29/2016 Document Reviewed: 12/27/2014 Elsevier Interactive Patient Education  2017 [employer].

## 2020-06-13 NOTE — Patient Instructions (Signed)
Please return in 6 months for hypertension follow up.  I will release your lab results to you on your MyChart account with further instructions. Please reply with any questions.   If you have any questions or concerns, please don't hesitate to send me a message via MyChart or call the office at [phone]. Thank you for visiting with us today! It's our pleasure caring for you.   

## 2020-06-13 NOTE — Progress Notes (Signed)
Subjective  Chief Complaint  Patient presents with  . Annual Exam    Non-fasting labs.   . Coronary Artery Disease  . COPD  . Hypertension  . Hyperlipidemia  . Arthritis    HPI: Megan [name] is a 74 y.o. female who presents to [facility] [place] today for a Female Wellness Visit. She also has the concerns and/or needs as listed above in the chief complaint. These will be addressed in addition to the Health Maintenance Visit.   Wellness Visit: annual visit with health maintenance review and exam without Pap   HM: Mammogram is up-to-date and due in September.  Reviewed colonoscopy from December 2020, hyperplastic polyps.  No further screening recommended.  Bone density done last year stable osteopenia.  Immunizations are up-to-date.  Of note, her husband was diagnosed with Parkinson's disease.  Is having gait abnormalities and some memory problems.  She currently is coping well with this. Chronic disease f/u and/or acute problem visit: (deemed necessary to be done in addition to the wellness visit):  Lumbar DJD with chronic pain: To have spinal fusion surgery soon.  Neurosurgery managing care.  Hypertension hyperlipidemia have been well controlled.  Continues on her chronic medications without adverse effects.  No chest pain palpitations or symptoms of CHF.  PAD: Stable.  Denies significant claudication symptoms at this time.  Fibromyalgia: Overall doing very well with this.  COPD: Tried brextri but it gave her a cough.  She reports that her breathing is doing fine.  She denies symptomatic wheezing although at times she will wheeze.  This will resolve spontaneously.  She denies daily cough.  She does have some shortness of breath walking up hills or climbing stairs but she believes this is multifactorial and not related to COPD.  She does not have a pulmonologist.  History of elevated glucose: Highest A1c last year was 6.6.  She has been eating better.  No  symptoms of hypoglycemia.  Assessment  1. Annual physical exam   2. Essential hypertension   3. IFG (impaired fasting glucose)   4. Mixed hyperlipidemia   5. Peripheral vascular disease with claudication ([place])   6. Fatty liver   7. Fibromyalgia   8. DJD (degenerative joint disease), lumbosacral   9. COPD with chronic bronchitis [facility])      Plan  Female Wellness Visit:  Age appropriate Health Maintenance and Prevention measures were discussed with patient. Included topics are cancer screening recommendations, ways to keep healthy (see AVS) including dietary and exercise recommendations, regular eye and dental care, use of seat belts, and avoidance of moderate alcohol use and tobacco use.  Screens are up-to-date.  Schedule mammogram in September.  Stable osteopenia  BMI: discussed patient's BMI and encouraged positive lifestyle modifications to help get to or maintain a target BMI.  HM needs and immunizations were addressed and ordered. See below for orders. See HM and immunization section for updates.  Up-to-date  Routine labs and screening tests ordered including cmp, cbc and lipids where appropriate.  Discussed recommendations regarding Vit D and calcium supplementation (see AVS)  Chronic disease management visit and/or acute problem visit:  Hypertension is well controlled  Coronary disease and PAD stable  Check hyperlipidemia current medications.  Have been well controlled, but she was checked January, nonfasting  Family liver without symptoms.  Monitor with labs  Pain due to DJD and fibromyalgia: For lumbar fusion syndrome.  Hopefully this will improve her pain significantly  COPD currently without significant symptoms.  Monitor.  No further intervention today.  Follow up: 6 months for follow-up medical problems including hypertension Orders Placed This Encounter  Procedures  . Comprehensive metabolic panel  . Lipid panel  . CBC with Differential/Platelet  . TSH  .  POCT glycosylated hemoglobin (Hb A1C)   No orders of the defined types were placed in this encounter.     Lifestyle: Body mass index is 29.84 kg/m. Wt Readings from Last 3 Encounters:  06/13/20 152 lb 12.8 oz (69.3 kg)  05/09/20 152 lb 9.6 oz (69.2 kg)  02/27/20 160 lb (72.6 kg)     Patient Active Problem List   Diagnosis Date Noted  . COPD with chronic bronchitis ([place]) 09/28/2018    Priority: High  . Essential hypertension 07/10/2018    Priority: High  . IFG (impaired fasting glucose) 11/10/2017    Priority: High  . 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   . Coronary artery disease     Priority: High    a. s/p CABG in 1995;  b. 05/2013 Neg MV, EF 82%;  c. 06/2013 Cath: LM nl, LAD 40-50p, LCX nl, RCA 80p/136m, VG->RCA->PDA->PLSA min irregs, LIMA->LAD atretic, vigorous LV fxn.   . Peripheral vascular disease with claudication ([place])     Priority: High  . History of abdominal aortic aneurysm repair 05/18/2011    Priority: High    Stent graft 2009    . Mixed hyperlipidemia     Priority: High  . DJD (degenerative joint disease), lumbosacral 11/10/2017    Priority: Medium  . Fibromyalgia 11/10/2017    Priority: Medium  . GERD (gastroesophageal reflux disease) 11/10/2017    Priority: Medium  . Fatty liver 11/10/2017    Priority: Medium  . 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    Priority: Medium    Severe by colonoscopy 09/2014, [name]   . History of renal artery stenosis 05/18/2011    Priority: Medium    Right renal artery stent 2008   . Dyshidrotic eczema 11/10/2017    Priority: Low  . Osteopenia 11/10/2017    Priority: Low    DEXA 10/2019, T = -1.3 lowest, osteopenia. Recheck 2-3 years.  Last Dexa 2009; declines further testing; does not tolerate biphosphanates   . Does use hearing aid 11/10/2017    Priority: Low   Health Maintenance  Topic Date Due  . INFLUENZA VACCINE  07/06/2020  . MAMMOGRAM  09/02/2020  .  OPHTHALMOLOGY EXAM  12/06/2020  . HEMOGLOBIN A1C  12/14/2020  . TETANUS/TDAP  08/09/2022  . COLONOSCOPY  11/04/2024  . COVID-19 Vaccine  Completed  . Hepatitis C Screening  Completed  . PNA vac Low Risk Adult  Completed  . FOOT EXAM  Discontinued   Immunization History  Administered Date(s) Administered  . Fluad Quad(high Dose 65+) 07/30/2019  . Influenza, High Dose Seasonal PF 08/13/2017, 08/26/2018  . Influenza, Seasonal, Injecte, Preservative Fre 09/21/2014, 09/06/2015, 07/21/2016  . Influenza,inj,quad, With Preservative 08/06/2017  . Influenza-Unspecified 08/10/2017  . PFIZER SARS-COV-2 Vaccination 12/22/2019, 01/12/2020  . PPD Test 09/13/1979  . Pneumococcal Conjugate-13 12/31/2016  . Pneumococcal Polysaccharide-23 12/16/2014, 08/01/2019  . Td 05/13/2008  . Tdap 08/09/2012  . Zoster 11/03/2010  . Zoster Recombinat (Shingrix) 01/13/2018, 03/14/2018   We updated and reviewed the patient's past history in detail and it is documented below. Allergies: Patient is allergic to anticoagulant compound, morphine and related, bextra [valdecoxib], hydrocod polst-cpm polst er, lipitor [atorvastatin], mevacor [lovastatin], plavix  [clopidogrel bisulfate],  zocor [simvastatin], doxycycline, metoprolol, plavix [clopidogrel bisulfate], codeine, hydrocod polst-cpm polst er, and lisinopril. Past Medical History Patient  has a past medical history of AAA (abdominal aortic aneurysm) ([place]), Adenomatous colon polyp (05/2001), Adenomatous duodenal polyp, Allergy, Anginal pain ([place]), COPD (chronic obstructive pulmonary disease) ([place]), Coronary artery disease, Diabetes mellitus without complication ([place]), Diverticulosis, Eczema, dyshidrotic, Eczema, dyshidrotic (1986), Emphysema of lung ([place]), Fibromyalgia, GERD (gastroesophageal reflux disease), Hyperlipidemia, Hypertension, Migraine, Myocardial infarction ([place]), Osteoarthritis, Osteoporosis, Peripheral arterial disease ([place]), and Renal artery stenosis  ([place]). Past Surgical History Patient  has a past surgical history that includes Renal artery stent (2008); Coronary artery bypass graft (07/1994); Carpal tunnel release (08/1993); Tonsilectomy, adenoidectomy, bilateral myringotomy and tubes (1989); Total abdominal hysterectomy (1992); Carpal tunnel release (01/1997); thyroid cyst apiration (05/1997 and 07/1998); Cardiovascular stress test (11/11/2009); Cardiac catheterization; Abdominal aortic aneurysm repair (2009); left heart catheterization with coronary/graft angiogram (N/A, 06/11/2013); abdominal aortagram (N/A, 03/06/2014); Hand surgery (Right, 09/2002); Multiple tooth extractions (2000); Abdominal hysterectomy; dental implants; Breast biopsy; Shoulder arthroscopy with rotator cuff repair (Right, 09/27/2018); and Cataract extraction, bilateral (2021). Family History: Patient family history includes AAA (abdominal aortic aneurysm) in her brother; Breast cancer in her sister; Colon cancer in her cousin and paternal grandmother; Diabetes in her brother and sister; Heart attack in her brother, father, mother, and sister; Heart disease in her brother, brother, father, mother, and sister; Hyperlipidemia in her brother and sister; Hypertension in her brother and sister; Liver cancer in her sister. Social History:  Patient  reports that she quit smoking about 24 years ago. Her smoking use included cigarettes. She smoked 0.00 packs per day for 37.00 years. She has never used smokeless tobacco. She reports current alcohol use of about 6.0 - 10.0 standard drinks of alcohol per week. She reports that she does not use drugs.  Review of Systems: Constitutional: negative for fever or malaise Ophthalmic: negative for photophobia, double vision or loss of vision Cardiovascular: negative for chest pain, dyspnea on exertion, or new LE swelling Respiratory: negative for SOB or persistent cough Gastrointestinal: negative for abdominal pain, change in bowel habits or  melena Genitourinary: negative for dysuria or gross hematuria, no abnormal uterine bleeding or disharge Musculoskeletal: negative for new gait disturbance or muscular weakness Integumentary: negative for new or persistent rashes, no breast lumps Neurological: negative for TIA or stroke symptoms Psychiatric: negative for SI or delusions Allergic/Immunologic: negative for hives  Patient Care Team    Relationship Specialty Notifications Start End  [name], MD PCP - General Family Medicine  05/21/13   [place], Peter M, MD PCP - Cardiology Cardiology Admissions 05/08/20   [place], Peter M, MD Consulting Physician Cardiology  05/24/17   Vicie Mutters, MD Consulting Physician Otolaryngology  11/10/17   Ladene Artist, MD Consulting Physician Gastroenterology  11/10/17   Penni Bombard, MD Consulting Physician Neurology  11/10/17   Reynold Bowen, MD Consulting Physician Endocrinology  11/10/17   Sharyne Peach, MD Consulting Physician Ophthalmology  11/10/17   Renelda Mom, DMD Consulting Physician Dentistry  11/10/17   Rolm Bookbinder, MD Consulting Physician Dermatology  01/12/18   Ortho, [place]  Specialist  01/12/18   Woodfin Ganja  Dentistry  01/25/19     Objective  Vitals: BP 128/76   Pulse 63   Temp 98 F (36.7 C) (Temporal)   Resp 18   Ht 5' (1.524 m)   Wt 152 lb 12.8 oz (69.3 kg)   SpO2 98%   BMI 29.84 kg/m  General:  Well developed, well nourished,  no acute distress  Psych:  Alert and orientedx3,normal mood and affect HEENT:  Normocephalic, atraumatic, non-icteric sclera,  supple neck without adenopathy, mass or thyromegaly Cardiovascular:  Normal S1, S2, RRR without gallop, rub or murmur Respiratory:  Good breath sounds bilaterally, CTAB with normal respiratory effort Gastrointestinal: normal bowel sounds, soft, non-tender, no noted masses. No HSM MSK: no deformities, contusions. Joints are without erythema or swelling.  Skin:  Warm, no rashes or suspicious lesions noted Neurologic:     Mental status is normal. CN 2-11 are normal. Gross motor and sensory exams are normal. Normal gait. No tremor Breast Exam: No mass, skin retraction or nipple discharge is appreciated in either breast. No axillary adenopathy. Fibrocystic changes are not noted    Commons side effects, risks, benefits, and alternatives for medications and treatment plan prescribed today were discussed, and the patient expressed understanding of the given instructions. Patient is instructed to call or message via MyChart if he/she has any questions or concerns regarding our treatment plan. No barriers to understanding were identified. We discussed Red Flag symptoms and signs in detail. Patient expressed understanding regarding what to do in case of urgent or emergency type symptoms.   Medication list was reconciled, printed and provided to the patient in AVS. Patient instructions and summary information was reviewed with the patient as documented in the AVS. This note was prepared with assistance of Dragon voice recognition software. Occasional wrong-word or sound-a-like substitutions may have occurred due to the inherent limitations of voice recognition software  This visit occurred during the SARS-CoV-2 public health emergency.  Safety protocols were in place, including screening questions prior to the visit, additional usage of staff PPE, and extensive cleaning of exam room while observing appropriate contact time as indicated for disinfecting solutions.

## 2020-06-13 NOTE — Progress Notes (Signed)
Subjective:   Aarian Cleaver is a 74 y.o. female who presents for Medicare Annual (Subsequent) preventive examination.  Review of Systems     Cardiac Risk Factors include: advanced age (>60men, >57 women);dyslipidemia;hypertension;obesity (BMI >30kg/m2)     Objective:    Today's Vitals   06/13/20 0957  PainSc: 4    There is no height or weight on file to calculate BMI.  Advanced Directives 06/13/2020 09/25/2019 01/25/2019 09/27/2018 09/21/2018 01/12/2018 08/24/2017  Does Patient Have a Medical Advance Directive? Yes Yes Yes Yes Yes Yes Yes  Type of Paramedic of [place];Living will [place];Living will [place];Living will [place];Living will [place];Living will Living will;Healthcare Power of [place];Living will  Does patient want to make changes to medical advance directive? - No - Patient declined - No - Patient declined - - -  Copy of [place] in Chart? Yes - validated most recent copy scanned in chart (See row information) - Yes - validated most recent copy scanned in chart (See row information) Yes Yes No - copy requested -  Pre-existing out of facility DNR order (yellow form or pink MOST form) - - - - - - -    Current Medications (verified) Outpatient Encounter Medications as of 06/13/2020  Medication Sig  . Accu-Chek FastClix Lancets MISC Check blood sugar once daily PRN  . albuterol (PROVENTIL HFA;VENTOLIN HFA) 108 (90 Base) MCG/ACT inhaler Inhale 2 puffs into the lungs every 4 (four) hours as needed for wheezing or shortness of breath.  . ALPRAZolam (XANAX) 0.25 MG tablet Take 1 tablet (0.25 mg total) by mouth at bedtime as needed. (Patient taking differently: Take 0.25 mg by mouth at bedtime as needed for sleep. )  . amLODipine (NORVASC) 5 MG tablet TAKE 1 TABLET BY MOUTH EVERY DAY  . aspirin EC 81 MG tablet Take 1  tablet (81 mg total) by mouth daily. (Patient taking differently: Take 325 mg by mouth daily. )  . atorvastatin (LIPITOR) 80 MG tablet Take 1 tablet (80 mg total) by mouth daily. (Patient taking differently: Take 80 mg by mouth at bedtime. )  . augmented betamethasone dipropionate (DIPROLENE-AF) 0.05 % cream Apply 1 application topically daily as needed (rash). For hands  . B Complex-Biotin-FA (B-100 COMPLEX PO) Take 1 tablet by mouth at bedtime.   . Budeson-Glycopyrrol-Formoterol (BREZTRI AEROSPHERE) 160-9-4.8 MCG/ACT AERO Inhale 2 puffs into the lungs in the morning and at bedtime.  . ciclopirox (LOPROX) 0.77 % cream Apply 1 application topically daily as needed (hands fungus).   . clobetasol cream (TEMOVATE) 1.61 % Apply 1 application topically 2 (two) times daily as needed (hands).   . CORDRAN 4 MCG/SQCM TAPE Apply 1 each topically daily as needed (Rash).   . cycloSPORINE (RESTASIS) 0.05 % ophthalmic emulsion Place 1 drop into both eyes 2 (two) times daily.   [name] gabapentin (NEURONTIN) 300 MG capsule Take 1-2 capsules (300-600 mg total) by mouth 3 (three) times daily. (Patient taking differently: Take 300 mg by mouth 3 (three) times daily. )  . glucose blood (ACCU-CHEK SMARTVIEW) test strip CHECK BLOOD SUGAR ONCE DAILY AS NEEDED  . hydrALAZINE (APRESOLINE) 10 MG tablet TAKE 1 TABLET BY MOUTH THREE TIMES A DAY (Patient taking differently: Take 10 mg by mouth 3 (three) times daily. )  . losartan-hydrochlorothiazide (HYZAAR) 100-25 MG tablet Take 1 tablet by mouth daily.  . Multiple Vitamins-Minerals (MULTIVITAMIN WITH MINERALS) tablet Take  1 tablet by mouth at bedtime.  . nitroGLYCERIN (NITROSTAT) 0.4 MG SL tablet Place 1 tablet (0.4 mg total) under the tongue every 5 (five) minutes as needed for chest pain.  [name] omeprazole (PRILOSEC) 40 MG capsule TAKE 1 CAPSULE BY MOUTH TWICE A DAY (Patient taking differently: Take 40 mg by mouth 2 (two) times daily. )  . potassium chloride (KLOR-CON) 10 MEQ tablet  TAKE 2 TABLETS (20 MEQ TOTAL) BY MOUTH 2 (TWO) TIMES DAILY.  [name] torsemide (DEMADEX) 20 MG tablet Take 1/2 tablet daily (Patient taking differently: Take 10 mg by mouth every other day. )  . tretinoin (RETIN-A) 0.1 % cream Apply 1 application topically every other day. In the evening  . VITAMIN D, ERGOCALCIFEROL, PO Take 1,000 Units by mouth every evening.    No facility-administered encounter medications on file as of 06/13/2020.    Allergies (verified) Anticoagulant compound, Morphine and related, Bextra [valdecoxib], Hydrocod polst-cpm polst er, Lipitor [atorvastatin], Mevacor [lovastatin], Plavix  [clopidogrel bisulfate], Zocor [simvastatin], Doxycycline, Metoprolol, Plavix [clopidogrel bisulfate], Codeine, Hydrocod polst-cpm polst er, and Lisinopril   History: Past Medical History:  Diagnosis Date  . AAA (abdominal aortic aneurysm) ([place])    a. s/p stent grafting in 2009.  . Adenomatous colon polyp 05/2001  . Adenomatous duodenal polyp   . Allergy   . Anginal pain ([place])   . COPD (chronic obstructive pulmonary disease) ([place])    pt denies COPD  . Coronary artery disease    a. s/p CABG in 1995;  b. 05/2013 Neg MV, EF 82%;  c. 06/2013 Cath: LM nl, LAD 40-50p, LCX nl, RCA 80p/16m, VG->RCA->PDA->PLSA min irregs, LIMA->LAD atretic, vigorous LV fxn.  . Diabetes mellitus without complication (HCC)    history of, resolved. 2017  . Diverticulosis   . Eczema, dyshidrotic   . Eczema, dyshidrotic 1986   Dr. Mathis Fare  . Emphysema of lung ([place])   . Fibromyalgia   . GERD (gastroesophageal reflux disease)   . Hyperlipidemia   . Hypertension   . Migraine   . Myocardial infarction ([place])   . Osteoarthritis   . Osteoporosis    unsure, having a bone scan soon  . Peripheral arterial disease (HCC)    left leg diagnosed by Dr Mare Ferrari  . Renal artery stenosis ([place])    a. 2008 s/p PTA   Past Surgical History:  Procedure Laterality Date  . ABDOMINAL AORTAGRAM N/A 03/06/2014   Procedure: ABDOMINAL  AORTAGRAM;  Surgeon: Rosetta Posner, MD;  Location[facility] C[facility] LAB;  Service: Cardiovascular;  Laterality: N/A;  . ABDOMINAL AORTIC ANEURYSM REPAIR  2009   stenting  . ABDOMINAL HYSTERECTOMY     Total, 1992  . BREAST BIOPSY    . CARDIAC CATHETERIZATION    . CARDIOVASCULAR STRESS TEST  11/11/2009   normal study  . CARPAL TUNNEL RELEASE  08/1993   left wrist  . CARPAL TUNNEL RELEASE  01/1997   right  . CATARACT EXTRACTION, BILATERAL  2021  . CORONARY ARTERY BYPASS GRAFT  07/1994   Triple by Dr Ceasar Mons  . dental implants    . HAND SURGERY Right 09/2002   Right hand pulley release  . LEFT HEART CATHETERIZATION WITH CORONARY/GRAFT ANGIOGRAM N/A 06/11/2013   Procedure: LEFT HEART CATHETERIZATION WITH [name];  Surgeon: Thayer Headings, MD;  Location: [facility] CATH LAB;  Service: Cardiovascular;  Laterality: N/A;  . MULTIPLE TOOTH EXTRACTIONS  2000   All upper teeth.  Has full dneture.   [name] RENAL ARTERY STENT  2008  . SHOULDER ARTHROSCOPY WITH ROTATOR CUFF REPAIR Right 09/27/2018   Procedure: Right shoulder mini open rotator cuff repair;  Surgeon: Susa Day, MD;  Location: [facility] ORS;  Service: Orthopedics;  Laterality: Right;  90 mins  . thyroid cyst apiration  05/1997 and 07/1998  . TONSILECTOMY, ADENOIDECTOMY, BILATERAL MYRINGOTOMY AND TUBES  1989  . TOTAL ABDOMINAL HYSTERECTOMY  1992   Family History  Problem Relation Age of Onset  . Heart disease Mother   . Heart attack Mother   . Heart disease Father        before age 35  . Heart attack Father   . Heart disease Sister        before age 55  . Diabetes Sister   . Hyperlipidemia Sister   . Hypertension Sister   . Heart attack Sister   . Liver cancer Sister   . Breast cancer Sister   . Heart disease Brother   . Diabetes Brother   . Hyperlipidemia Brother   . Hypertension Brother   . Heart attack Brother   . AAA (abdominal aortic aneurysm) Brother   . Colon cancer Paternal Grandmother   . Heart disease Brother   .  Colon cancer Cousin        first cousin on father's side  . Pancreatic cancer Neg Hx   . Rectal cancer Neg Hx   . Stomach cancer Neg Hx   . Esophageal cancer Neg Hx    Social History   Socioeconomic History  . Marital status: Married    Spouse name: Not on file  . Number of children: Not on file  . Years of education: Not on file  . Highest education level: Not on file  Occupational History  . Not on file  Tobacco Use  . Smoking status: Former Smoker    Packs/day: 0.00    Years: 37.00    Pack years: 0.00    Types: Cigarettes    Quit date: 12/07/1995    Years since quitting: 24.5  . Smokeless tobacco: Never Used  Vaping Use  . Vaping Use: Never used  Substance and Sexual Activity  . Alcohol use: Yes    Alcohol/week: 6.0 - 10.0 standard drinks    Types: 6 - 10 Glasses of wine per week    Comment: 2-3 glasses daily  . Drug use: No  . Sexual activity: Not Currently    Partners: Male  Other Topics Concern  . Not on file  Social History Narrative   Lives at home with husband.  Is retired.  Education 4 yrs of college.  No children.   Caffiene 2 cups daily.    Social Determinants of Health   Financial Resource Strain: Low Risk   . Difficulty of Paying Living Expenses: Not hard at all  Food Insecurity: No Food Insecurity  . Worried About Charity fundraiser in the Last Year: Never true  . Ran Out of Food in the Last Year: Never true  Transportation Needs: No Transportation Needs  . Lack of Transportation (Medical): No  . Lack of Transportation (Non-Medical): No  Physical Activity: Insufficiently Active  . Days of Exercise per Week: 4 days  . Minutes of Exercise per Session: 10 min  Stress: No Stress Concern Present  . Feeling of Stress : Only a little  Social Connections: Moderately Isolated  . Frequency of Communication with Friends and Family: More than three times a week  . Frequency of Social Gatherings with Friends and Family: Three  times a week  . Attends  Religious Services: Never  . Active Member of Clubs or Organizations: No  . Attends Archivist Meetings: Never  . Marital Status: Married    Tobacco Counseling Counseling given: Not Answered   Clinical Intake:  Pre-visit preparation completed: Yes  Pain : 0-10 Pain Score: 4  Pain Type: Chronic pain Pain Location: Back Pain Orientation: Lower Pain Descriptors / Indicators: Aching Pain Onset: More than a month ago Pain Frequency: Constant Pain Relieving Factors:  (Pt will have surgery this wed)  Pain Relieving Factors:  (Pt will have surgery this wed)  BMI - recorded: 29.84 Nutritional Status: BMI > 30  Obese Nutritional Risks: None Diabetes: No  How often do you need to have someone help you when you read instructions, pamphlets, or other written materials from your doctor or pharmacy?: 1 - Never  Diabetic?No  Interpreter Needed?: No  Information entered by :: Charlott Rakes, LPN   Activities of Daily Living In your present state of health, do you have any difficulty performing the following activities: 06/13/2020 07/30/2019  Hearing? N N  Vision? N Y  Difficulty concentrating or making decisions? N N  Walking or climbing stairs? N N  Dressing or bathing? N N  Doing errands, shopping? N N  Preparing Food and eating ? N -  Using the Toilet? N -  In the past six months, have you accidently leaked urine? N -  Do you have problems with loss of bowel control? N -  Managing your Medications? N -  Managing your Finances? N -  Housekeeping or managing your Housekeeping? N -  Some recent data might be hidden    Patient Care Team: [name], MD as PCP - General (Family Medicine) [place]ique, Peter M, MD as PCP - Cardiology (Cardiology) [place], Peter M, MD as Consulting Physician (Cardiology) Vicie Mutters, MD as Consulting Physician (Otolaryngology) Ladene Artist, MD as Consulting Physician (Gastroenterology) Penni Bombard, MD as Consulting  Physician (Neurology) Reynold Bowen, MD as Consulting Physician (Endocrinology) Sharyne Peach, MD as Consulting Physician (Ophthalmology) Renelda Mom, DMD as Consulting Physician (Dentistry) Rolm Bookbinder, MD as Consulting Physician (Dermatology) Orth[facility], [facility] (Specialist) Woodfin Ganja (Dentistry)  Indicate any recent Medical Services you may have received from other than [facility] providers in the past year (date may be approximate).     Assessment:   This is a routine wellness examination for Rylah.  Hearing/Vision screen  Hearing Screening   125Hz  250Hz  500Hz  1000Hz  2000Hz  3000Hz  4000Hz  6000Hz  8000Hz   Right ear:           Left ear:           Comments: Wears hearing aids in both ears  Vision Screening Comments: Had cataract surgery and wears reading glasses, Dr Valetta Close for annual eye exams  Dietary issues and exercise activities discussed: Current Exercise Habits: Home exercise routine, Type of exercise: walking, Time (Minutes): 10, Frequency (Times/Week): 3, Weekly Exercise (Minutes/Week): 30, Intensity: Mild, Exercise limited by: orthopedic condition(s);respiratory conditions(s);cardiac condition(s)  Goals    . Patient Stated     Maintain current health by staying active.     . Patient Stated     Healthy living    . Weight (lb) < 155 lb (70.3 kg)     Lose weight by fasting.       Depression Screen PHQ 2/9 Scores 06/13/2020 07/30/2019 01/25/2019 01/12/2018 10/13/2017  PHQ - 2 Score 0 0 0 0 0    Fall Risk Fall Risk  06/13/2020 07/30/2019 01/25/2019 11/14/2018 01/12/2018  Falls in the past year? 0 0 0 0 No  Number falls in past yr: 0 0 - - -  Injury with Fall? 0 0 - - -  Risk for fall due to : Impaired mobility Other (Comment) - - -  Risk for fall due to: Comment related to low back pain Occassional dizziness - - -  Follow up - Falls evaluation completed - - -    Any stairs in or around the home? Yes  If so, are there any without handrails? Yes  Home free of loose throw rugs in  walkways, pet beds, electrical cords, etc? Yes  Adequate lighting in your home to reduce risk of falls? Yes   ASSISTIVE DEVICES UTILIZED TO PREVENT FALLS:  Life alert? Yes  Use of a cane, walker or w/c? No  Grab bars in the bathroom? Yes  Shower chair or bench in shower? No  Elevated toilet seat or a handicapped toilet? Yes   TIMED UP AND GO:  Was the test performed? Yes .  Length of time to ambulate 10 feet: 10 sec.   Gait steady and fast without use of assistive device  Cognitive Function: MMSE - Mini Mental State Exam 01/25/2019  Orientation to time 5  Orientation to Place 5  Registration 3  Attention/ Calculation 5  Recall 0  Language- name 2 objects 2  Language- repeat 1  Language- follow 3 step command 3  Language- read & follow direction 1  Write a sentence 1  Copy design 1  Total score 27     6CIT Screen 06/13/2020  What Year? 0 points  What month? 0 points  What time? 0 points  Count back from 20 2 points  Months in reverse 0 points  Repeat phrase 0 points  Total Score 2    Immunizations Immunization History  Administered Date(s) Administered  . Fluad Quad(high Dose 65+) 07/30/2019  . Influenza, High Dose Seasonal PF 08/13/2017, 08/26/2018  . Influenza, Seasonal, Injecte, Preservative Fre 09/21/2014, 09/06/2015, 07/21/2016  . Influenza,inj,quad, With Preservative 08/06/2017  . Influenza-Unspecified 08/10/2017  . PFIZER SARS-COV-2 Vaccination 12/22/2019, 01/12/2020  . PPD Test 09/13/1979  . Pneumococcal Conjugate-13 12/31/2016  . Pneumococcal Polysaccharide-23 12/16/2014, 08/01/2019  . Td 05/13/2008  . Tdap 08/09/2012  . Zoster 11/03/2010  . Zoster Recombinat (Shingrix) 01/13/2018, 03/14/2018    TDAP status: Up to date Flu Vaccine status: Up to date Pneumococcal vaccine status: Up to date Covid-19 vaccine status: Completed vaccines  Qualifies for Shingles Vaccine? Yes   Zostavax completed Yes   Shingrix Completed?: Yes  Screening  Tests Health Maintenance  Topic Date Due  . INFLUENZA VACCINE  07/06/2020  . MAMMOGRAM  09/02/2020  . OPHTHALMOLOGY EXAM  12/06/2020  . HEMOGLOBIN A1C  12/14/2020  . TETANUS/TDAP  08/09/2022  . COLONOSCOPY  11/04/2024  . COVID-19 Vaccine  Completed  . Hepatitis C Screening  Completed  . PNA vac Low Risk Adult  Completed  . FOOT EXAM  Discontinued    Health Maintenance  There are no preventive care reminders to display for this patient.  Colorectal cancer screening: Completed 11/05/19. Repeat every 5 years Mammogram status: Completed 09/03/19. Repeat every year Bone Density status: Completed 11/08/19. Results reflect: Bone density results: OSTEOPENIA. Repeat every 2 years.    Hepatitis C Screening: Completed 12/15/16  Vision Screening: Recommended annual ophthalmology exams for early detection of glaucoma and other disorders of the eye. Is the patient up to date with their annual eye  exam?  Yes  Who is the provider or what is the name of the office in which the patient attends annual eye exams? Dr Valetta Close   Dental Screening: Recommended annual dental exams for proper oral hygiene  Community Resource Referral / Chronic Care Management: CRR required this visit?  No   CCM required this visit?  No      Plan:     I have personally reviewed and noted the following in the patient's chart:   . Medical and social history . Use of alcohol, tobacco or illicit drugs  . Current medications and supplements . Functional ability and status . Nutritional status . Physical activity . Advanced directives . List of other physicians . Hospitalizations, surgeries, and ER visits in previous 12 months . Vitals . Screenings to include cognitive, depression, and falls . Referrals and appointments  In addition, I have reviewed and discussed with patient certain preventive protocols, quality metrics, and best practice recommendations. A written personalized care plan for preventive services  as well as general preventive health recommendations were provided to patient.     Willette Brace, LPN   03/06/5829   Nurse Notes: None

## 2020-06-13 NOTE — Pre-Procedure Instructions (Signed)
[id number] - [place], [place] - [address] [place] [address] Phone: [phone] Fax: [phone]    Your procedure is scheduled on Wednesday, July 14 from 08:30 AM- 12:48 PM.  Report to [facility] Main Entrance "A" at 06:30 A.M., and check in at the Admitting office.  Call this number if you have problems the morning of surgery:  [phone]  Call [phone] if you have any questions prior to your surgery date Monday-Friday 8am-4pm.    Remember:  Do not eat or drink after midnight the night before your surgery.    Take these medicines the morning of surgery with A SIP OF WATER: amLODipine (NORVASC) gabapentin (NEURONTIN) hydrALAZINE (APRESOLINE)  omeprazole (PRILOSEC)  Budeson-Glycopyrrol-Formoterol (BREZTRI AEROSPHERE) inhaler cycloSPORINE (RESTASIS) eye drops  IF NEEDED: nitroGLYCERIN (NITROSTAT) albuterol (PROVENTIL HFA;VENTOLIN HFA) inhaler- Please bring all inhalers with you the day of surgery.    Follow your surgeon's instructions on when to stop Aspirin.  If no instructions were given by your surgeon then you will need to call the office to get those instructions.    As of today, STOP taking any Aleve, Naproxen, Ibuprofen, Motrin, Advil, Goody's, BC's, all herbal medications, fish oil, and all vitamins.   HOW TO MANAGE YOUR DIABETES BEFORE AND AFTER SURGERY  Why is it important to control my blood sugar before and after surgery? . Improving blood sugar levels before and after surgery helps healing and can limit problems. . A way of improving blood sugar control is eating a healthy diet by: o  Eating less sugar and carbohydrates o  Increasing activity/exercise o  Talking with your doctor about reaching your blood sugar goals . High blood sugars (greater than 180 mg/dL) can raise your risk of infections and slow your recovery, so you will need to focus on controlling your diabetes during the weeks before  surgery. . Make sure that the doctor who takes care of your diabetes knows about your planned surgery including the date and location.  How do I manage my blood sugar before surgery? . Check your blood sugar at least 4 times a day, starting 2 days before surgery, to make sure that the level is not too high or low. . Check your blood sugar the morning of your surgery when you wake up and every 2 hours until you get to the Short Stay unit. o If your blood sugar is less than 70 mg/dL, you will need to treat for low blood sugar: - Treat a low blood sugar (less than 70 mg/dL) with  cup of clear juice (cranberry or apple), 4 glucose tablets, OR glucose gel. - Recheck blood sugar in 15 minutes after treatment (to make sure it is greater than 70 mg/dL). If your blood sugar is not greater than 70 mg/dL on recheck, call [phone] for further instructions. . Report your blood sugar to the short stay nurse when you get to Short Stay.  . If you are admitted to the hospital after surgery: o Your blood sugar will be checked by the staff and you will probably be given insulin after surgery (instead of oral diabetes medicines) to make sure you have good blood sugar levels. o The goal for blood sugar control after surgery is 80-180 mg/dL.        The Morning of Surgery:               Do not wear jewelry, make up, or nail polish.  Do not wear lotions, powders, perfumes, or deodorant.            Do not shave 48 hours prior to surgery.              Do not bring valuables to the hospital.            [facility] is not responsible for any belongings or valuables.  Do NOT Smoke (Tobacco/Vaping) or drink Alcohol 24 hours prior to your procedure. If you use a CPAP at night, you may bring all equipment for your overnight stay.   Contacts, glasses, dentures or bridgework may not be worn into surgery.      For patients admitted to the hospital, discharge time will be determined by your treatment team.    Patients discharged the day of surgery will not be allowed to drive home, and someone needs to stay with them for 24 hours.    Special instructions:   [place]- Preparing For Surgery  Before surgery, you can play an important role. Because skin is not sterile, your skin needs to be as free of germs as possible. You can reduce the number of germs on your skin by washing with CHG (chlorahexidine gluconate) Soap before surgery.  CHG is an antiseptic cleaner which kills germs and bonds with the skin to continue killing germs even after washing.    Oral Hygiene is also important to reduce your risk of infection.  Remember - BRUSH YOUR TEETH THE MORNING OF SURGERY WITH YOUR REGULAR TOOTHPASTE  Please do not use if you have an allergy to CHG or antibacterial soaps. If your skin becomes reddened/irritated stop using the CHG.  Do not shave (including legs and underarms) for at least 48 hours prior to first CHG shower. It is OK to shave your face.  Please follow these instructions carefully.   1. Shower the NIGHT BEFORE SURGERY and the MORNING OF SURGERY with CHG Soap.   2. If you chose to wash your hair, wash your hair first as usual with your normal shampoo.  3. After you shampoo, rinse your hair and body thoroughly to remove the shampoo.  4. Use CHG as you would any other liquid soap. You can apply CHG directly to the skin and wash gently with a scrungie or a clean washcloth.   5. Apply the CHG Soap to your body ONLY FROM THE NECK DOWN.  Do not use on open wounds or open sores. Avoid contact with your eyes, ears, mouth and genitals (private parts). Wash Face and genitals (private parts)  with your normal soap.   6. Wash thoroughly, paying special attention to the area where your surgery will be performed.  7. Thoroughly rinse your body with warm water from the neck down.  8. DO NOT shower/wash with your normal soap after using and rinsing off the CHG Soap.  9. Pat yourself dry with a  CLEAN TOWEL.  10. Wear CLEAN PAJAMAS to bed the night before surgery  11. Place CLEAN SHEETS on your bed the night of your first shower and DO NOT SLEEP WITH PETS.   Day of Surgery: Wear Clean/Comfortable clothing the morning of surgery Do not apply any deodorants/lotions.   Remember to brush your teeth WITH YOUR REGULAR TOOTHPASTE.   Please read over the following fact sheets that you were given.

## 2020-06-16 ENCOUNTER — Ambulatory Visit (HOSPITAL_COMMUNITY)
Admission: RE | Admit: 2020-06-16 | Discharge: 2020-06-16 | Disposition: A | Discharge status: Home / Self Care | Payer: Medicare Other | Source: Ambulatory Visit | Attending: Neurological Surgery | Admitting: Neurological Surgery

## 2020-06-16 ENCOUNTER — Other Ambulatory Visit: Payer: Self-pay

## 2020-06-16 ENCOUNTER — Other Ambulatory Visit (HOSPITAL_COMMUNITY)
Admission: RE | Admit: 2020-06-16 | Discharge: 2020-06-16 | Disposition: A | Discharge status: Home / Self Care | Payer: Medicare Other | Source: Ambulatory Visit | Attending: Neurological Surgery | Admitting: Neurological Surgery

## 2020-06-16 ENCOUNTER — Encounter (HOSPITAL_COMMUNITY)
Admission: RE | Admit: 2020-06-16 | Discharge: 2020-06-16 | Disposition: A | Discharge status: Home / Self Care | Payer: Medicare Other | Source: Ambulatory Visit | Attending: Neurological Surgery | Admitting: Neurological Surgery

## 2020-06-16 ENCOUNTER — Encounter (HOSPITAL_COMMUNITY): Payer: Self-pay

## 2020-06-16 DIAGNOSIS — M48061 Spinal stenosis, lumbar region without neurogenic claudication: Secondary | ICD-10-CM | POA: Diagnosis not present

## 2020-06-16 DIAGNOSIS — K219 Gastro-esophageal reflux disease without esophagitis: Secondary | ICD-10-CM | POA: Diagnosis not present

## 2020-06-16 DIAGNOSIS — Z7951 Long term (current) use of inhaled steroids: Secondary | ICD-10-CM | POA: Diagnosis not present

## 2020-06-16 DIAGNOSIS — Z83438 Family history of other disorder of lipoprotein metabolism and other lipidemia: Secondary | ICD-10-CM | POA: Diagnosis not present

## 2020-06-16 DIAGNOSIS — J449 Chronic obstructive pulmonary disease, unspecified: Secondary | ICD-10-CM | POA: Diagnosis not present

## 2020-06-16 DIAGNOSIS — M4310 Spondylolisthesis, site unspecified: Secondary | ICD-10-CM

## 2020-06-16 DIAGNOSIS — Z833 Family history of diabetes mellitus: Secondary | ICD-10-CM | POA: Diagnosis not present

## 2020-06-16 DIAGNOSIS — M4316 Spondylolisthesis, lumbar region: Secondary | ICD-10-CM | POA: Diagnosis not present

## 2020-06-16 DIAGNOSIS — Z951 Presence of aortocoronary bypass graft: Secondary | ICD-10-CM | POA: Diagnosis not present

## 2020-06-16 DIAGNOSIS — I252 Old myocardial infarction: Secondary | ICD-10-CM | POA: Diagnosis not present

## 2020-06-16 DIAGNOSIS — Z79899 Other long term (current) drug therapy: Secondary | ICD-10-CM | POA: Diagnosis not present

## 2020-06-16 DIAGNOSIS — Z8249 Family history of ischemic heart disease and other diseases of the circulatory system: Secondary | ICD-10-CM | POA: Diagnosis not present

## 2020-06-16 DIAGNOSIS — Z01818 Encounter for other preprocedural examination: Secondary | ICD-10-CM | POA: Diagnosis not present

## 2020-06-16 DIAGNOSIS — Z20822 Contact with and (suspected) exposure to covid-19: Secondary | ICD-10-CM | POA: Diagnosis not present

## 2020-06-16 DIAGNOSIS — Z7982 Long term (current) use of aspirin: Secondary | ICD-10-CM | POA: Diagnosis not present

## 2020-06-16 DIAGNOSIS — I10 Essential (primary) hypertension: Secondary | ICD-10-CM | POA: Diagnosis not present

## 2020-06-16 DIAGNOSIS — Z87891 Personal history of nicotine dependence: Secondary | ICD-10-CM | POA: Diagnosis not present

## 2020-06-16 HISTORY — DX: Fatty (change of) liver, not elsewhere classified: K76.0

## 2020-06-16 LAB — TYPE AND SCREEN
ABO/RH(D): O POS
Antibody Screen: NEGATIVE

## 2020-06-16 LAB — PROTIME-INR
INR: 1 (ref 0.8–1.2)
Prothrombin Time: 13.1 seconds (ref 11.4–15.2)

## 2020-06-16 LAB — SURGICAL PCR SCREEN
MRSA, PCR: NEGATIVE
Staphylococcus aureus: NEGATIVE

## 2020-06-16 LAB — SARS CORONAVIRUS 2 (TAT 6-24 HRS): SARS Coronavirus 2: NEGATIVE

## 2020-06-16 LAB — GLUCOSE, CAPILLARY: Glucose-Capillary: 186 mg/dL — ABNORMAL HIGH (ref 70–99)

## 2020-06-16 NOTE — Progress Notes (Addendum)
PCP - Billey Chang, MD Cardiologist - [name], MD  PPM/ICD - Denies  Chest x-ray - 06/16/20 EKG - 05/09/20 Stress Test - 05/28/13 ECHO - 11/16/14 Cardiac Cath - 06/11/13  Sleep Study - Denies  Fasting Blood Sugar: ~98-102  Checks Blood Sugar every 3-4 months. CBG at PAT appointment was 186. Last A1C 5.9 06/13/20.  Blood Thinner Instructions: N/A Aspirin Instructions: per patient last dose 06/13/20  ERAS Protcol - N/A PRE-SURGERY Ensure or G2- N/A  COVID TEST- 06/16/20   Anesthesia review: Yes, cardiac hx.   Patient denies shortness of breath, fever, cough and chest pain at PAT appointment   All instructions explained to the patient, with a verbal understanding of the material. Patient agrees to go over the instructions while at home for a better understanding. Patient also instructed to self quarantine after being tested for COVID-19. The opportunity to ask questions was provided.

## 2020-06-17 NOTE — Anesthesia Preprocedure Evaluation (Addendum)
Anesthesia Evaluation  Patient identified by MRN, date of birth, ID band Patient awake    Reviewed: Allergy & Precautions, NPO status , Patient's Chart, lab work & pertinent test results  History of Anesthesia Complications Negative for: history of anesthetic complications  Airway Mallampati: II  TM Distance: >3 FB Neck ROM: Full    Dental  (+) Dental Advisory Given, Edentulous Upper, Edentulous Lower   Pulmonary COPD,  COPD inhaler, former smoker,    Pulmonary exam normal        Cardiovascular hypertension, Pt. on medications + CAD, + Past MI, + CABG and + Peripheral Vascular Disease  Normal cardiovascular exam   AAA s/p stent grafting    Neuro/Psych  Headaches, negative psych ROS   GI/Hepatic Neg liver ROS, GERD  Medicated and Controlled,  Endo/Other  diabetes, Type 2 Obesity   Renal/GU  Hx RAS s/p renal artery stenting       Musculoskeletal  (+) Arthritis , Fibromyalgia -  Abdominal   Peds  Hematology negative hematology ROS (+)   Anesthesia Other Findings Covid test negative See PAT note  Reproductive/Obstetrics                            Anesthesia Physical Anesthesia Plan  ASA: III  Anesthesia Plan: General   Post-op Pain Management:    Induction: Intravenous  PONV Risk Score and Plan: 4 or greater and Treatment may vary due to age or medical condition, Ondansetron and Dexamethasone  Airway Management Planned: Oral ETT  Additional Equipment: None  Intra-op Plan:   Post-operative Plan: Extubation in OR  Informed Consent: I have reviewed the patients History and Physical, chart, labs and discussed the procedure including the risks, benefits and alternatives for the proposed anesthesia with the patient or authorized representative who has indicated his/her understanding and acceptance.     Dental advisory given  Plan Discussed with: CRNA and  Anesthesiologist  Anesthesia Plan Comments:        Anesthesia Quick Evaluation

## 2020-06-17 NOTE — Progress Notes (Signed)
Anesthesia Chart Review:  Follows with cardiology for history of CAD status post CABG in 1995.  Last seen by Dr. Martinique 05/09/2020 and cleared for surgery.  Per note, "CAD status post CABG in 1995. Most recent cardiac cath in July 2014 showed no recurrent stenoses and patent grafts. Most recent treadmill Myoview 11/17/14 normal with ejection fraction 86%. She has no significant symptoms of angina. No CHF. She notes that prior stress tests really haven't been very helpful. Given clinical stability I think her operative risk for lumbar surgery is low to moderate based on age and CV disease."  Follows with [name] for history of PAD.  She is status post stent the right renal artery 2008. Status post stent graft abdominal aortic aneurysm 2009.  Status post iliac stent in 2016.  CT scan from 09/18/2019 shows stable aneurysm position without evidence of endoleak. Her maximal diameter is slightly less at 4.2 cm from 4.4 cm.  Iliac vessels are widely patent.  Last seen by [name] 09/25/2019.  Recommend in 1 year follow-up with repeat CT scan.  Seen at and CBC from 06/13/2020 reviewed, unremarkable.  Exercise stress test 11/17/2014: Conclusion: Patient exercised for duration of 5 minutes 45 seconds.  Test was stopped due to target HR achieved and also severe hypertension with systolic blood pressure greater than 200.  She had mild chest tightness with symptoms resolved by test completion.  No acute ST-T wave changes.  IMPRESSION:  1. No reversible ischemia or infarction.   2. Normal left ventricular wall motion.   3. Left ventricular ejection fraction 86%   4. Low-risk stress test findings*.    TTE 11/16/2014: - Left ventricle: The cavity size was normal. Wall thickness was  increased in a pattern of mild LVH. The estimated ejection  fraction was 60%. Wall motion was normal; there were no regional  wall motion abnormalities.  - Mitral valve: Calcified annulus.  - Right ventricle: The  cavity size was normal. Systolic function  was normal.      Wynonia Musty [facility] Short Stay Center/Anesthesiology Phone [phone] 06/17/2020 10:02 AM

## 2020-06-18 ENCOUNTER — Inpatient Hospital Stay (HOSPITAL_COMMUNITY)
Admission: RE | Admit: 2020-06-18 | Discharge: 2020-06-19 | DRG: 455 | Disposition: A | Discharge status: Home / Self Care | Payer: Medicare Other | Attending: Neurological Surgery | Admitting: Neurological Surgery

## 2020-06-18 ENCOUNTER — Inpatient Hospital Stay (HOSPITAL_COMMUNITY): Payer: Medicare Other | Admitting: Certified Registered"

## 2020-06-18 ENCOUNTER — Encounter (HOSPITAL_COMMUNITY): Payer: Self-pay | Admitting: Neurological Surgery

## 2020-06-18 ENCOUNTER — Inpatient Hospital Stay (HOSPITAL_COMMUNITY): Payer: Medicare Other | Admitting: Physician Assistant

## 2020-06-18 ENCOUNTER — Inpatient Hospital Stay (HOSPITAL_COMMUNITY): Payer: Medicare Other

## 2020-06-18 ENCOUNTER — Other Ambulatory Visit: Payer: Self-pay

## 2020-06-18 ENCOUNTER — Encounter (HOSPITAL_COMMUNITY)
Admission: RE | Disposition: A | Discharge status: Home / Self Care | Payer: Self-pay | Source: Home / Self Care | Attending: Neurological Surgery

## 2020-06-18 DIAGNOSIS — M48061 Spinal stenosis, lumbar region without neurogenic claudication: Principal | ICD-10-CM | POA: Diagnosis present

## 2020-06-18 DIAGNOSIS — K219 Gastro-esophageal reflux disease without esophagitis: Secondary | ICD-10-CM | POA: Diagnosis present

## 2020-06-18 DIAGNOSIS — Z79899 Other long term (current) drug therapy: Secondary | ICD-10-CM | POA: Diagnosis not present

## 2020-06-18 DIAGNOSIS — I252 Old myocardial infarction: Secondary | ICD-10-CM

## 2020-06-18 DIAGNOSIS — M4316 Spondylolisthesis, lumbar region: Secondary | ICD-10-CM | POA: Diagnosis not present

## 2020-06-18 DIAGNOSIS — Z87891 Personal history of nicotine dependence: Secondary | ICD-10-CM | POA: Diagnosis not present

## 2020-06-18 DIAGNOSIS — M4802 Spinal stenosis, cervical region: Secondary | ICD-10-CM | POA: Diagnosis not present

## 2020-06-18 DIAGNOSIS — M532X6 Spinal instabilities, lumbar region: Secondary | ICD-10-CM | POA: Diagnosis not present

## 2020-06-18 DIAGNOSIS — Z951 Presence of aortocoronary bypass graft: Secondary | ICD-10-CM

## 2020-06-18 DIAGNOSIS — Z8249 Family history of ischemic heart disease and other diseases of the circulatory system: Secondary | ICD-10-CM

## 2020-06-18 DIAGNOSIS — Z7982 Long term (current) use of aspirin: Secondary | ICD-10-CM

## 2020-06-18 DIAGNOSIS — Z833 Family history of diabetes mellitus: Secondary | ICD-10-CM

## 2020-06-18 DIAGNOSIS — Z981 Arthrodesis status: Secondary | ICD-10-CM

## 2020-06-18 DIAGNOSIS — Z419 Encounter for procedure for purposes other than remedying health state, unspecified: Secondary | ICD-10-CM

## 2020-06-18 DIAGNOSIS — I2510 Atherosclerotic heart disease of native coronary artery without angina pectoris: Secondary | ICD-10-CM | POA: Diagnosis not present

## 2020-06-18 DIAGNOSIS — J449 Chronic obstructive pulmonary disease, unspecified: Secondary | ICD-10-CM | POA: Diagnosis not present

## 2020-06-18 DIAGNOSIS — M4326 Fusion of spine, lumbar region: Secondary | ICD-10-CM | POA: Diagnosis not present

## 2020-06-18 DIAGNOSIS — Z7951 Long term (current) use of inhaled steroids: Secondary | ICD-10-CM | POA: Diagnosis not present

## 2020-06-18 DIAGNOSIS — I10 Essential (primary) hypertension: Secondary | ICD-10-CM | POA: Diagnosis not present

## 2020-06-18 DIAGNOSIS — Z83438 Family history of other disorder of lipoprotein metabolism and other lipidemia: Secondary | ICD-10-CM | POA: Diagnosis not present

## 2020-06-18 DIAGNOSIS — E782 Mixed hyperlipidemia: Secondary | ICD-10-CM | POA: Diagnosis not present

## 2020-06-18 DIAGNOSIS — Z20822 Contact with and (suspected) exposure to covid-19: Secondary | ICD-10-CM | POA: Diagnosis present

## 2020-06-18 LAB — GLUCOSE, CAPILLARY
Glucose-Capillary: 139 mg/dL — ABNORMAL HIGH (ref 70–99)
Glucose-Capillary: 168 mg/dL — ABNORMAL HIGH (ref 70–99)
Glucose-Capillary: 181 mg/dL — ABNORMAL HIGH (ref 70–99)
Glucose-Capillary: 127 mg/dL — ABNORMAL HIGH (ref 70–99)

## 2020-06-18 SURGERY — POSTERIOR LUMBAR FUSION 2 LEVEL
Anesthesia: General | Site: Spine Lumbar

## 2020-06-18 MED ORDER — METHOCARBAMOL 1000 MG/10ML IJ SOLN
500.0000 mg | Freq: Four times a day (QID) | INTRAVENOUS | Status: DC | PRN
  Filled 2020-06-18: qty 5

## 2020-06-18 MED ORDER — DEXAMETHASONE SODIUM PHOSPHATE 10 MG/ML IJ SOLN
INTRAMUSCULAR | Status: AC
  Filled 2020-06-18: qty 1

## 2020-06-18 MED ORDER — KETOROLAC TROMETHAMINE 30 MG/ML IJ SOLN
INTRAMUSCULAR | Status: DC | PRN
Start: 2020-06-18 — End: 2020-06-18
  Administered 2020-06-18: 15 mg via INTRAVENOUS

## 2020-06-18 MED ORDER — ACETAMINOPHEN 10 MG/ML IV SOLN
INTRAVENOUS | Status: DC | PRN
  Administered 2020-06-18: 1000 mg via INTRAVENOUS

## 2020-06-18 MED ORDER — EPHEDRINE 5 MG/ML INJ
INTRAVENOUS | Status: AC
  Filled 2020-06-18: qty 10

## 2020-06-18 MED ORDER — DIPHENHYDRAMINE HCL 25 MG PO CAPS
25.0000 mg | ORAL_CAPSULE | ORAL | Status: DC | PRN
  Administered 2020-06-18: 25 mg via ORAL
  Filled 2020-06-18: qty 1

## 2020-06-18 MED ORDER — UMECLIDINIUM BROMIDE 62.5 MCG/INH IN AEPB
1.0000 | INHALATION_SPRAY | Freq: Every day | RESPIRATORY_TRACT | Status: DC
  Filled 2020-06-18: qty 7

## 2020-06-18 MED ORDER — 0.9 % SODIUM CHLORIDE (POUR BTL) OPTIME
TOPICAL | Status: DC | PRN
  Administered 2020-06-18: 1000 mL

## 2020-06-18 MED ORDER — LIDOCAINE HCL (CARDIAC) PF 100 MG/5ML IV SOSY
PREFILLED_SYRINGE | INTRAVENOUS | Status: DC | PRN
  Administered 2020-06-18: 60 mg via INTRAVENOUS

## 2020-06-18 MED ORDER — AMLODIPINE BESYLATE 5 MG PO TABS: 5.0000 mg | ORAL_TABLET | Freq: Every day | ORAL | Status: DC

## 2020-06-18 MED ORDER — PHENYLEPHRINE HCL-NACL 10-0.9 MG/250ML-% IV SOLN
INTRAVENOUS | Status: DC | PRN
  Administered 2020-06-18: 25 ug/min via INTRAVENOUS

## 2020-06-18 MED ORDER — LACTATED RINGERS IV SOLN: INTRAVENOUS | Status: DC

## 2020-06-18 MED ORDER — ALBUTEROL SULFATE (2.5 MG/3ML) 0.083% IN NEBU: 2.5000 mg | INHALATION_SOLUTION | RESPIRATORY_TRACT | Status: DC | PRN

## 2020-06-18 MED ORDER — ONDANSETRON HCL 4 MG PO TABS: 4.0000 mg | ORAL_TABLET | Freq: Four times a day (QID) | ORAL | Status: DC | PRN

## 2020-06-18 MED ORDER — THROMBIN 5000 UNITS EX SOLR
CUTANEOUS | Status: AC
  Filled 2020-06-18: qty 5000

## 2020-06-18 MED ORDER — HYDRALAZINE HCL 10 MG PO TABS
10.0000 mg | ORAL_TABLET | Freq: Three times a day (TID) | ORAL | Status: DC
  Administered 2020-06-18 – 2020-06-19 (×2): 10 mg via ORAL
  Filled 2020-06-18 (×2): qty 1

## 2020-06-18 MED ORDER — INSULIN ASPART 100 UNIT/ML ~~LOC~~ SOLN
0.0000 [IU] | Freq: Three times a day (TID) | SUBCUTANEOUS | Status: DC
  Administered 2020-06-18: 3 [IU] via SUBCUTANEOUS

## 2020-06-18 MED ORDER — METHOCARBAMOL 500 MG PO TABS
ORAL_TABLET | ORAL | Status: AC
  Filled 2020-06-18: qty 1

## 2020-06-18 MED ORDER — MIDAZOLAM HCL 2 MG/2ML IJ SOLN
INTRAMUSCULAR | Status: AC
  Filled 2020-06-18: qty 2

## 2020-06-18 MED ORDER — METHOCARBAMOL 500 MG PO TABS
500.0000 mg | ORAL_TABLET | Freq: Four times a day (QID) | ORAL | Status: DC | PRN
  Administered 2020-06-18 – 2020-06-19 (×3): 500 mg via ORAL
  Filled 2020-06-18 (×4): qty 1

## 2020-06-18 MED ORDER — THROMBIN 20000 UNITS EX SOLR
CUTANEOUS | Status: AC
  Filled 2020-06-18: qty 20000

## 2020-06-18 MED ORDER — SODIUM CHLORIDE 0.9% FLUSH
3.0000 mL | Freq: Two times a day (BID) | INTRAVENOUS | Status: DC
  Administered 2020-06-18: 3 mL via INTRAVENOUS

## 2020-06-18 MED ORDER — POTASSIUM CHLORIDE IN NACL 20-0.9 MEQ/L-% IV SOLN: INTRAVENOUS | Status: DC

## 2020-06-18 MED ORDER — ALBUTEROL SULFATE HFA 108 (90 BASE) MCG/ACT IN AERS: 2.0000 | INHALATION_SPRAY | RESPIRATORY_TRACT | Status: DC | PRN

## 2020-06-18 MED ORDER — OXYCODONE HCL 5 MG PO TABS
5.0000 mg | ORAL_TABLET | Freq: Once | ORAL | Status: DC | PRN
Start: 2020-06-18 — End: 2020-06-18

## 2020-06-18 MED ORDER — MIDAZOLAM HCL 5 MG/5ML IJ SOLN
INTRAMUSCULAR | Status: DC | PRN
  Administered 2020-06-18: 2 mg via INTRAVENOUS

## 2020-06-18 MED ORDER — CYCLOSPORINE 0.05 % OP EMUL
1.0000 [drp] | Freq: Two times a day (BID) | OPHTHALMIC | Status: DC
  Administered 2020-06-18 – 2020-06-19 (×2): 1 [drp] via OPHTHALMIC
  Filled 2020-06-18 (×3): qty 1

## 2020-06-18 MED ORDER — PROPOFOL 10 MG/ML IV BOLUS
INTRAVENOUS | Status: AC
  Filled 2020-06-18: qty 20

## 2020-06-18 MED ORDER — LOSARTAN POTASSIUM-HCTZ 100-25 MG PO TABS: 1.0000 | ORAL_TABLET | Freq: Every day | ORAL | Status: DC

## 2020-06-18 MED ORDER — MORPHINE SULFATE (PF) 2 MG/ML IV SOLN
2.0000 mg | INTRAVENOUS | Status: DC | PRN
  Administered 2020-06-18: 2 mg via INTRAVENOUS
  Filled 2020-06-18: qty 1

## 2020-06-18 MED ORDER — SODIUM CHLORIDE (PF) 0.9 % IJ SOLN
INTRAMUSCULAR | Status: DC | PRN
  Administered 2020-06-18: 5 mL

## 2020-06-18 MED ORDER — OXYCODONE HCL 5 MG PO TABS
ORAL_TABLET | ORAL | Status: AC
  Filled 2020-06-18: qty 1

## 2020-06-18 MED ORDER — PHENYLEPHRINE HCL (PRESSORS) 10 MG/ML IV SOLN
INTRAVENOUS | Status: DC | PRN
  Administered 2020-06-18: 40 ug via INTRAVENOUS

## 2020-06-18 MED ORDER — SODIUM CHLORIDE 0.9 % IV SOLN: 250.0000 mL | INTRAVENOUS | Status: DC

## 2020-06-18 MED ORDER — ROCURONIUM BROMIDE 100 MG/10ML IV SOLN
INTRAVENOUS | Status: DC | PRN
  Administered 2020-06-18: 50 mg via INTRAVENOUS
  Administered 2020-06-18: 20 mg via INTRAVENOUS

## 2020-06-18 MED ORDER — BUPIVACAINE HCL (PF) 0.25 % IJ SOLN
INTRAMUSCULAR | Status: DC | PRN
  Administered 2020-06-18: 3 mL

## 2020-06-18 MED ORDER — LIDOCAINE 2% (20 MG/ML) 5 ML SYRINGE
INTRAMUSCULAR | Status: AC
  Filled 2020-06-18: qty 5

## 2020-06-18 MED ORDER — FENTANYL CITRATE (PF) 100 MCG/2ML IJ SOLN
INTRAMUSCULAR | Status: AC
  Filled 2020-06-18: qty 2

## 2020-06-18 MED ORDER — LACTATED RINGERS IV SOLN: INTRAVENOUS | Status: DC | PRN

## 2020-06-18 MED ORDER — CHLORHEXIDINE GLUCONATE CLOTH 2 % EX PADS: 6.0000 | MEDICATED_PAD | Freq: Once | CUTANEOUS | Status: DC

## 2020-06-18 MED ORDER — OXYCODONE HCL 5 MG PO TABS
5.0000 mg | ORAL_TABLET | ORAL | Status: DC | PRN
  Administered 2020-06-18 – 2020-06-19 (×5): 5 mg via ORAL
  Filled 2020-06-18 (×4): qty 1

## 2020-06-18 MED ORDER — SODIUM CHLORIDE 0.9 % IV SOLN
INTRAVENOUS | Status: DC | PRN
  Administered 2020-06-18: 500 mL

## 2020-06-18 MED ORDER — GABAPENTIN 300 MG PO CAPS
300.0000 mg | ORAL_CAPSULE | Freq: Three times a day (TID) | ORAL | Status: DC
  Administered 2020-06-18 – 2020-06-19 (×3): 300 mg via ORAL
  Filled 2020-06-18 (×3): qty 1

## 2020-06-18 MED ORDER — ONDANSETRON HCL 4 MG/2ML IJ SOLN: 4.0000 mg | Freq: Four times a day (QID) | INTRAMUSCULAR | Status: DC | PRN

## 2020-06-18 MED ORDER — ACETAMINOPHEN 10 MG/ML IV SOLN
INTRAVENOUS | Status: AC
  Filled 2020-06-18: qty 100

## 2020-06-18 MED ORDER — PROPOFOL 10 MG/ML IV BOLUS
INTRAVENOUS | Status: DC | PRN
  Administered 2020-06-18: 110 mg via INTRAVENOUS

## 2020-06-18 MED ORDER — SUGAMMADEX SODIUM 200 MG/2ML IV SOLN
INTRAVENOUS | Status: DC | PRN
  Administered 2020-06-18: 200 mg via INTRAVENOUS

## 2020-06-18 MED ORDER — GLYCOPYRROLATE PF 0.2 MG/ML IJ SOSY
PREFILLED_SYRINGE | INTRAMUSCULAR | Status: AC
  Filled 2020-06-18: qty 1

## 2020-06-18 MED ORDER — ACETAMINOPHEN 325 MG PO TABS: 650.0000 mg | ORAL_TABLET | ORAL | Status: DC | PRN

## 2020-06-18 MED ORDER — OXYCODONE HCL 5 MG/5ML PO SOLN: 5.0000 mg | Freq: Once | ORAL | Status: DC | PRN

## 2020-06-18 MED ORDER — ACETAMINOPHEN 650 MG RE SUPP: 650.0000 mg | RECTAL | Status: DC | PRN

## 2020-06-18 MED ORDER — ARTHREX ANGEL - ACD-A SOLUTION (CHARTING ONLY) OPTIME
TOPICAL | Status: DC | PRN
  Administered 2020-06-18: 10 mL via TOPICAL

## 2020-06-18 MED ORDER — BUPIVACAINE HCL (PF) 0.25 % IJ SOLN
INTRAMUSCULAR | Status: AC
  Filled 2020-06-18: qty 30

## 2020-06-18 MED ORDER — ONDANSETRON HCL 4 MG/2ML IJ SOLN
INTRAMUSCULAR | Status: AC
  Filled 2020-06-18: qty 2

## 2020-06-18 MED ORDER — ASPIRIN EC 325 MG PO TBEC
325.0000 mg | DELAYED_RELEASE_TABLET | Freq: Every day | ORAL | Status: DC
  Administered 2020-06-19: 325 mg via ORAL
  Filled 2020-06-18 (×2): qty 1

## 2020-06-18 MED ORDER — HEPARIN SODIUM (PORCINE) 1000 UNIT/ML IJ SOLN
INTRAMUSCULAR | Status: DC | PRN
  Administered 2020-06-18: 5000 [IU] via INTRAVENOUS

## 2020-06-18 MED ORDER — THROMBIN 5000 UNITS EX SOLR
OROMUCOSAL | Status: DC | PRN
  Administered 2020-06-18: 5 mL via TOPICAL

## 2020-06-18 MED ORDER — FENTANYL CITRATE (PF) 100 MCG/2ML IJ SOLN
INTRAMUSCULAR | Status: DC | PRN
  Administered 2020-06-18: 50 ug via INTRAVENOUS
  Administered 2020-06-18: 100 ug via INTRAVENOUS
  Administered 2020-06-18 (×3): 50 ug via INTRAVENOUS

## 2020-06-18 MED ORDER — BUDESON-GLYCOPYRROL-FORMOTEROL 160-9-4.8 MCG/ACT IN AERO: 2.0000 | INHALATION_SPRAY | Freq: Two times a day (BID) | RESPIRATORY_TRACT | Status: DC

## 2020-06-18 MED ORDER — EPHEDRINE SULFATE-NACL 50-0.9 MG/10ML-% IV SOSY
PREFILLED_SYRINGE | INTRAVENOUS | Status: DC | PRN
  Administered 2020-06-18: 10 mg via INTRAVENOUS
  Administered 2020-06-18: 5 mg via INTRAVENOUS

## 2020-06-18 MED ORDER — FENTANYL CITRATE (PF) 100 MCG/2ML IJ SOLN
25.0000 ug | INTRAMUSCULAR | Status: DC | PRN
  Administered 2020-06-18: 50 ug via INTRAVENOUS
  Administered 2020-06-18: 25 ug via INTRAVENOUS

## 2020-06-18 MED ORDER — MENTHOL 3 MG MT LOZG: 1.0000 | LOZENGE | OROMUCOSAL | Status: DC | PRN

## 2020-06-18 MED ORDER — CEFAZOLIN SODIUM-DEXTROSE 2-4 GM/100ML-% IV SOLN
2.0000 g | Freq: Three times a day (TID) | INTRAVENOUS | Status: AC
  Administered 2020-06-18 (×2): 2 g via INTRAVENOUS
  Filled 2020-06-18 (×2): qty 100

## 2020-06-18 MED ORDER — LOSARTAN POTASSIUM 50 MG PO TABS
100.0000 mg | ORAL_TABLET | Freq: Every day | ORAL | Status: DC
  Administered 2020-06-18: 100 mg via ORAL
  Filled 2020-06-18: qty 2

## 2020-06-18 MED ORDER — GLYCOPYRROLATE 0.2 MG/ML IJ SOLN
INTRAMUSCULAR | Status: DC | PRN
  Administered 2020-06-18: .2 mg via INTRAVENOUS

## 2020-06-18 MED ORDER — FENTANYL CITRATE (PF) 250 MCG/5ML IJ SOLN
INTRAMUSCULAR | Status: AC
  Filled 2020-06-18: qty 5

## 2020-06-18 MED ORDER — PHENYLEPHRINE 40 MCG/ML (10ML) SYRINGE FOR IV PUSH (FOR BLOOD PRESSURE SUPPORT)
PREFILLED_SYRINGE | INTRAVENOUS | Status: AC
  Filled 2020-06-18: qty 10

## 2020-06-18 MED ORDER — ROCURONIUM BROMIDE 10 MG/ML (PF) SYRINGE
PREFILLED_SYRINGE | INTRAVENOUS | Status: AC
  Filled 2020-06-18: qty 10

## 2020-06-18 MED ORDER — FLUTICASONE FUROATE-VILANTEROL 100-25 MCG/INH IN AEPB
1.0000 | INHALATION_SPRAY | Freq: Every day | RESPIRATORY_TRACT | Status: DC
  Filled 2020-06-18: qty 28

## 2020-06-18 MED ORDER — ORAL CARE MOUTH RINSE
15.0000 mL | Freq: Once | OROMUCOSAL | Status: AC
  Administered 2020-06-18: 15 mL via OROMUCOSAL

## 2020-06-18 MED ORDER — PHENOL 1.4 % MT LIQD: 1.0000 | OROMUCOSAL | Status: DC | PRN

## 2020-06-18 MED ORDER — HEMOSTATIC AGENTS (NO CHARGE) OPTIME
TOPICAL | Status: DC | PRN
  Administered 2020-06-18: 1 via TOPICAL

## 2020-06-18 MED ORDER — HYDROCHLOROTHIAZIDE 25 MG PO TABS
25.0000 mg | ORAL_TABLET | Freq: Every day | ORAL | Status: DC
  Administered 2020-06-18 – 2020-06-19 (×2): 25 mg via ORAL
  Filled 2020-06-18 (×2): qty 1

## 2020-06-18 MED ORDER — HEPARIN SODIUM (PORCINE) 1000 UNIT/ML IJ SOLN
INTRAMUSCULAR | Status: AC
  Filled 2020-06-18: qty 1

## 2020-06-18 MED ORDER — PANTOPRAZOLE SODIUM 40 MG PO TBEC
40.0000 mg | DELAYED_RELEASE_TABLET | Freq: Every day | ORAL | Status: DC
  Administered 2020-06-19: 40 mg via ORAL
  Filled 2020-06-18: qty 1

## 2020-06-18 MED ORDER — SODIUM CHLORIDE 0.9% FLUSH: 3.0000 mL | INTRAVENOUS | Status: DC | PRN

## 2020-06-18 MED ORDER — ONDANSETRON HCL 4 MG/2ML IJ SOLN: 4.0000 mg | Freq: Once | INTRAMUSCULAR | Status: DC | PRN

## 2020-06-18 MED ORDER — SENNA 8.6 MG PO TABS
1.0000 | ORAL_TABLET | Freq: Two times a day (BID) | ORAL | Status: DC
  Administered 2020-06-18 – 2020-06-19 (×3): 8.6 mg via ORAL
  Filled 2020-06-18 (×3): qty 1

## 2020-06-18 MED ORDER — DEXAMETHASONE SODIUM PHOSPHATE 10 MG/ML IJ SOLN
10.0000 mg | Freq: Once | INTRAMUSCULAR | Status: AC
  Administered 2020-06-18: 10 mg via INTRAVENOUS
  Filled 2020-06-18: qty 1

## 2020-06-18 MED ORDER — CEFAZOLIN SODIUM-DEXTROSE 2-4 GM/100ML-% IV SOLN
2.0000 g | INTRAVENOUS | Status: AC
  Administered 2020-06-18: 2 g via INTRAVENOUS
  Filled 2020-06-18: qty 100

## 2020-06-18 MED ORDER — ONDANSETRON HCL 4 MG/2ML IJ SOLN
INTRAMUSCULAR | Status: DC | PRN
  Administered 2020-06-18: 4 mg via INTRAVENOUS

## 2020-06-18 SURGICAL SUPPLY — 69 items
BAG DECANTER FOR FLEXI CONT (MISCELLANEOUS) ×2 IMPLANT
BASKET BONE COLLECTION (BASKET) ×2 IMPLANT
BENZOIN TINCTURE PRP APPL 2/3 (GAUZE/BANDAGES/DRESSINGS) ×2 IMPLANT
BLADE CLIPPER SURG (BLADE) IMPLANT
BUR CARBIDE MATCH 3.0 (BURR) ×2 IMPLANT
CANISTER SUCT 3000ML PPV (MISCELLANEOUS) ×2 IMPLANT
CARTRIDGE OIL MAESTRO DRILL (MISCELLANEOUS) IMPLANT
CNTNR URN SCR LID CUP LEK RST (MISCELLANEOUS) ×1 IMPLANT
CONT SPEC 4OZ STRL OR WHT (MISCELLANEOUS) ×2
COVER BACK TABLE 60X90IN (DRAPES) ×2 IMPLANT
COVER WAND RF STERILE (DRAPES) IMPLANT
DERMABOND ADVANCED (GAUZE/BANDAGES/DRESSINGS) ×1
DERMABOND ADVANCED .7 DNX12 (GAUZE/BANDAGES/DRESSINGS) ×1 IMPLANT
DIFFUSER DRILL AIR PNEUMATIC (MISCELLANEOUS) IMPLANT
DRAPE C-ARM 42X72 X-RAY (DRAPES) ×6 IMPLANT
DRAPE C-ARMOR (DRAPES) IMPLANT
DRAPE LAPAROTOMY 100X72X124 (DRAPES) ×2 IMPLANT
DRAPE SURG 17X23 STRL (DRAPES) ×2 IMPLANT
DRSG OPSITE POSTOP 4X6 (GAUZE/BANDAGES/DRESSINGS) ×2 IMPLANT
DURAPREP 26ML APPLICATOR (WOUND CARE) ×2 IMPLANT
ELECT REM PT RETURN 9FT ADLT (ELECTROSURGICAL) ×2
ELECTRODE REM PT RTRN 9FT ADLT (ELECTROSURGICAL) ×1 IMPLANT
EVACUATOR 1/8 PVC DRAIN (DRAIN) IMPLANT
GAUZE 4X4 16PLY RFD (DISPOSABLE) IMPLANT
GLOVE BIO SURGEON STRL SZ7 (GLOVE) ×4 IMPLANT
GLOVE BIO SURGEON STRL SZ7.5 (GLOVE) ×2 IMPLANT
GLOVE BIO SURGEON STRL SZ8 (GLOVE) ×2 IMPLANT
GLOVE BIOGEL PI IND STRL 7.0 (GLOVE) ×1 IMPLANT
GLOVE BIOGEL PI IND STRL 7.5 (GLOVE) ×1 IMPLANT
GLOVE BIOGEL PI IND STRL 8 (GLOVE) ×2 IMPLANT
GLOVE BIOGEL PI INDICATOR 7.0 (GLOVE) ×1
GLOVE BIOGEL PI INDICATOR 7.5 (GLOVE) ×1
GLOVE BIOGEL PI INDICATOR 8 (GLOVE) ×2
GLOVE ECLIPSE 7.5 STRL STRAW (GLOVE) ×8 IMPLANT
GOWN STRL REUS W/ TWL LRG LVL3 (GOWN DISPOSABLE) ×1 IMPLANT
GOWN STRL REUS W/ TWL XL LVL3 (GOWN DISPOSABLE) ×3 IMPLANT
GOWN STRL REUS W/TWL 2XL LVL3 (GOWN DISPOSABLE) ×2 IMPLANT
GOWN STRL REUS W/TWL LRG LVL3 (GOWN DISPOSABLE) ×2
GOWN STRL REUS W/TWL XL LVL3 (GOWN DISPOSABLE) ×6
HEMOSTAT POWDER KIT SURGIFOAM (HEMOSTASIS) ×2 IMPLANT
KIT BASIN OR (CUSTOM PROCEDURE TRAY) ×2 IMPLANT
KIT BONE MRW ASP ANGEL CPRP (KITS) ×2 IMPLANT
KIT TURNOVER KIT B (KITS) ×2 IMPLANT
MILL MEDIUM DISP (BLADE) ×4 IMPLANT
NEEDLE HYPO 25X1 1.5 SAFETY (NEEDLE) ×2 IMPLANT
NS IRRIG 1000ML POUR BTL (IV SOLUTION) ×2 IMPLANT
OIL CARTRIDGE MAESTRO DRILL (MISCELLANEOUS)
PACK LAMINECTOMY NEURO (CUSTOM PROCEDURE TRAY) ×2 IMPLANT
PAD ARMBOARD 7.5X6 YLW CONV (MISCELLANEOUS) ×10 IMPLANT
PUTTY DBM ALLOSYNC PURE 10CC (Putty) ×2 IMPLANT
ROD LORD LIPPED TI 5.5X55 (Rod) ×4 IMPLANT
SCREW POLYAXIAL TULIP (Screw) ×12 IMPLANT
SCREW SHANK MOD 5.5X40 (Screw) ×12 IMPLANT
SET SCREW (Screw) ×12 IMPLANT
SET SCREW SPNE (Screw) ×6 IMPLANT
SPACER BATT PS 9X25X11 (Spacer) ×4 IMPLANT
SPACER BATT PS 9X25X12 (Spacer) ×4 IMPLANT
SPONGE LAP 4X18 RFD (DISPOSABLE) IMPLANT
SPONGE SURGIFOAM ABS GEL 100 (HEMOSTASIS) ×2 IMPLANT
STRIP CLOSURE SKIN 1/2X4 (GAUZE/BANDAGES/DRESSINGS) ×4 IMPLANT
SUT VIC AB 0 CT1 18XCR BRD8 (SUTURE) ×1 IMPLANT
SUT VIC AB 0 CT1 8-18 (SUTURE) ×2
SUT VIC AB 2-0 CP2 18 (SUTURE) ×2 IMPLANT
SUT VIC AB 3-0 SH 8-18 (SUTURE) ×4 IMPLANT
SYR CONTROL 10ML LL (SYRINGE) ×2 IMPLANT
TOWEL GREEN STERILE (TOWEL DISPOSABLE) ×2 IMPLANT
TOWEL GREEN STERILE FF (TOWEL DISPOSABLE) ×2 IMPLANT
TRAY FOLEY MTR SLVR 16FR STAT (SET/KITS/TRAYS/PACK) ×2 IMPLANT
WATER STERILE IRR 1000ML POUR (IV SOLUTION) ×2 IMPLANT

## 2020-06-18 NOTE — Anesthesia Procedure Notes (Signed)
Procedure Name: Intubation Date/Time: 06/18/2020 8:36 AM Performed by: Gwyndolyn Saxon, CRNA Pre-anesthesia Checklist: Patient identified, Emergency Drugs available, Suction available and Patient being monitored Patient Re-evaluated:Patient Re-evaluated prior to induction Oxygen Delivery Method: Circle System Utilized Preoxygenation: Pre-oxygenation with 100% oxygen Induction Type: IV induction Ventilation: Mask ventilation without difficulty Laryngoscope Size: Mac and 3 Grade View: Grade I Tube type: Oral Tube size: 7.0 mm Number of attempts: 1 Airway Equipment and Method: Stylet and Oral airway Placement Confirmation: ETT inserted through vocal cords under direct vision,  positive ETCO2 and breath sounds checked- equal and bilateral Secured at: 21 cm Tube secured with: Tape Dental Injury: Teeth and Oropharynx as per pre-operative assessment  Comments: Intubated by lauren ferm, srna under supervision of MD and CRNA

## 2020-06-18 NOTE — Anesthesia Postprocedure Evaluation (Signed)
Anesthesia Post Note  Patient: Megan [name]  Procedure(s) Performed: POSTERIOR LUMBAR INTERBODY FUSION LUMBAR THREE-FOUR, LUMBAR FOUR-FIVE. (N/A Spine Lumbar)     Patient location during evaluation: PACU Anesthesia Type: General Level of consciousness: awake and alert Pain management: pain level controlled Vital Signs Assessment: post-procedure vital signs reviewed and stable Respiratory status: spontaneous breathing, nonlabored ventilation and respiratory function stable Cardiovascular status: blood pressure returned to baseline and stable Postop Assessment: no apparent nausea or vomiting Anesthetic complications: no   No complications documented.  Last Vitals:  Vitals:   06/18/20 1336 06/18/20 1345  BP: (!) 131/52   Pulse:  84  Resp:  12  Temp:    SpO2:  97%    Last Pain:  Vitals:   06/18/20 1345  PainSc: Boswell Aser Nylund

## 2020-06-18 NOTE — Op Note (Signed)
06/18/2020  12:50 PM  PATIENT:  Megan [name]  74 y.o. 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 Decompressive lumbar laminectomy hemifacetectomy foraminotomies L3-4 L4-5 requiring more work than would be required for a simple exposure of the disk for PLIF in order to adequately decompress the neural elements and address the spinal stenosis 2. Posterior lumbar interbody fusion L3-4 L4-5 using peek interbody cages packed with morcellized allograft and autograft soaked with a bone marrow aspirate obtained through a separate fascial incision over the right iliac crest 3. Posterior fixation L3-L5 inclusive using Alphatec cortical pedicle screws.  4. Intertransverse arthrodesis L3-L5 using morcellized autograft and allograft.  SURGEON:  Sherley Bounds, MD  ASSISTANTS: Dr. Marcello Moores  ANESTHESIA:  General  EBL: 350 ml  Total I/O In: 1900 [I.V.:1900] Out: 825 [Urine:475; Blood:350]  BLOOD ADMINISTERED:none  DRAINS: none   INDICATION FOR PROCEDURE: This patient presented with severe back pain. Imaging revealed severe stenosis at L4-5 and moderate stenosis at L3-4 with dynamic instability at both levels. The patient tried a reasonable attempt at conservative medical measures without relief.  Pain was interfering with her quality of life and activities of daily living.  I recommended decompression and instrumented fusion to address the stenosis as well as the segmental  instability.  Patient understood the risks, benefits, and alternatives and potential outcomes and wished to proceed.  PROCEDURE DETAILS:  The patient was brought to the operating room. After induction of generalized endotracheal anesthesia the patient was rolled into the prone position on chest rolls and all pressure points were padded. The patient's lumbar region was cleaned and then prepped with  DuraPrep and draped in the usual sterile fashion. Anesthesia was injected and then a dorsal midline incision was made and carried down to the lumbosacral fascia. The fascia was opened and the paraspinous musculature was taken down in a subperiosteal fashion to expose L3-4 and L4-5. A self-retaining retractor was placed. Intraoperative fluoroscopy confirmed my level, and I started with placement of the L3 cortical pedicle screws. The pedicle screw entry zones were identified utilizing surface landmarks and  AP and lateral fluoroscopy. I scored the cortex with the high-speed drill and then used the hand drill to drill an upward and outward direction into the pedicle. I then tapped line to line. I then placed a 5.5 x 40 mm cortical pedicle screw into the pedicles of L3 bilaterally.  I then dissected in a suprafascial plane to expose the iliac crest.  Opened the fascia and we used a Jamshidi needle to extract 60 cc of bone marrow aspirate from the iliac crest with the assistance of my nurse practitioner.  This was then spun down by [facility] device and 2 to 4 cc of  BMAC was soaked on morselized allograft for later arthrodesis.  I dried the hole with Surgifoam and closed the fascia.  I then turned my attention to the decompression and complete lumbar laminectomies, hemi- facetectomies, and foraminotomies were performed at L3-4 and L4-5.  My nurse practitioner was directly involved in the decompression and exposure of the neural elements. the patient had significant spinal stenosis and this required more work than would be required for a simple exposure of the disc for posterior lumbar interbody fusion which would only require a limited laminotomy. Much more generous decompression and generous foraminotomy was undertaken in order to adequately decompress the neural elements and address the patient's  leg pain. The yellow ligament was removed to expose the underlying dura and nerve roots, and generous foraminotomies were  performed to adequately decompress the neural elements. Both the exiting and traversing nerve roots were decompressed on both sides until a coronary dilator passed easily along the nerve roots. Once the decompression was complete, I turned my attention to the posterior lower lumbar interbody fusion. The epidural venous vasculature was coagulated and cut sharply. Disc space was incised and the initial discectomy was performed with pituitary rongeurs. The disc space was distracted with sequential distractors to a height of 12 mm. We then used a series of scrapers and shavers to prepare the endplates for fusion. The midline was prepared with Epstein curettes. Once the complete discectomy was finished, we packed an appropriate sized interbody cage with local autograft and morcellized allograft, gently retracted the nerve root, and tapped the cage into position at L3-4 and L4-5.  The midline between the cages was packed with morselized autograft and allograft. We then turned our attention to the placement of the lower pedicle screws. The pedicle screw entry zones were identified utilizing surface landmarks and fluoroscopy. I drilled into each pedicle utilizing the hand drill, and tapped each pedicle with the appropriate tap. We palpated with a ball probe to assure no break in the cortex. We then placed 5.5 x 40 mm pedicle screws into the pedicles bilaterally at L4 and L5 bilaterally.  My nurse practitioner assisted in placement of the pedicle screws.  We then decorticated the transverse processes and laid a mixture of morcellized autograft and allograft out over these to perform intertransverse arthrodesis at L3-4 and L4-5. We then placed lordotic rods into the multiaxial screw heads of the pedicle screws and locked these in position with the locking caps and anti-torque device. We then checked our construct with AP and lateral fluoroscopy.  Dr. Marcello Moores helped check the x-rays and helped with the closure.  Irrigated with  copious amounts of bacitracin-containing saline solution. Inspected the nerve roots once again to assure adequate decompression, lined to the dura with Gelfoam, and then we closed the muscle and the fascia with 0 Vicryl. Closed the subcutaneous tissues with 2-0 Vicryl and subcuticular tissues with 3-0 Vicryl. The skin was closed with benzoin and Steri-Strips. Dressing was then applied, the patient was awakened from general anesthesia and transported to the recovery room in stable condition. At the end of the procedure all sponge, needle and instrument counts were correct.   PLAN OF CARE: admit to inpatient  PATIENT DISPOSITION:  PACU - hemodynamically stable.   Delay start of Pharmacological VTE agent (>24hrs) due to surgical blood loss or risk of bleeding:  yes

## 2020-06-18 NOTE — Progress Notes (Signed)
Bilat hearing aids ret to pt & put in ears. Upper & lower dentures ret to pt & put in mouth.

## 2020-06-18 NOTE — H&P (Signed)
Subjective: Patient is a 74 y.o. female admitted for lumbar instability with stenosis. Onset of symptoms was several years ago, rapidly worsening since that time.  The pain is rated severe, and is located at the across the lower back and radiates to hips. The pain is described as aching and occurs all day. The symptoms have been progressive. Symptoms are exacerbated by exercise. MRI or CT showed stenosis with spondylolisthesis   Past Medical History:  Diagnosis Date  . AAA (abdominal aortic aneurysm) ([place])    a. s/p stent grafting in 2009.  . Adenomatous colon polyp 05/2001  . Adenomatous duodenal polyp   . Allergy   . Anginal pain ([place])   . COPD (chronic obstructive pulmonary disease) ([place])    pt denies COPD  . Coronary artery disease    a. s/p CABG in 1995;  b. 05/2013 Neg MV, EF 82%;  c. 06/2013 Cath: LM nl, LAD 40-50p, LCX nl, RCA 80p/176m, VG->RCA->PDA->PLSA min irregs, LIMA->LAD atretic, vigorous LV fxn.  . Diabetes mellitus without complication (HCC)    history of, resolved. 2017  . Diverticulosis   . Eczema, dyshidrotic   . Eczema, dyshidrotic 1986   Dr. Mathis Fare  . Emphysema of lung ([place])   . Fatty liver   . Fibromyalgia   . GERD (gastroesophageal reflux disease)   . Hyperlipidemia   . Hypertension   . Migraine   . Myocardial infarction ([place])   . Osteoarthritis   . Osteoporosis    unsure, having a bone scan soon  . Peripheral arterial disease (HCC)    left leg diagnosed by Dr Mare Ferrari  . Renal artery stenosis ([place])    a. 2008 s/p PTA    Past Surgical History:  Procedure Laterality Date  . ABDOMINAL AORTAGRAM N/A 03/06/2014   Procedure: ABDOMINAL AORTAGRAM;  Surgeon: Rosetta Posner, MD;  Location: [facility] CATH LAB;  Service: Cardiovascular;  Laterality: N/A;  . ABDOMINAL AORTIC ANEURYSM REPAIR  2009   stenting  . ABDOMINAL HYSTERECTOMY     Total, 1992  . BREAST BIOPSY    . CARDIAC CATHETERIZATION    . CARDIOVASCULAR STRESS TEST  11/11/2009   normal study  . CARPAL  TUNNEL RELEASE  08/1993   left wrist  . CARPAL TUNNEL RELEASE  01/1997   right  . CATARACT EXTRACTION, BILATERAL  2021  . CORONARY ARTERY BYPASS GRAFT  07/1994   Triple by Dr Ceasar Mons  . dental implants    . EYE SURGERY  03/2020   cataract  . HAND SURGERY Right 09/2002   Right hand pulley release  . LEFT HEART CATHETERIZATION WITH CORONARY/GRAFT ANGIOGRAM N/A 06/11/2013   Procedure: LEFT HEART CATHETERIZATION WITH [name];  Surgeon: Thayer Headings, MD;  Location: [facility] CATH LAB;  Service: Cardiovascular;  Laterality: N/A;  . MULTIPLE TOOTH EXTRACTIONS  2000   All upper teeth.  Has full dneture.   [name] RENAL ARTERY STENT  2008  . SHOULDER ARTHROSCOPY WITH ROTATOR CUFF REPAIR Right 09/27/2018   Procedure: Right shoulder mini open rotator cuff repair;  Surgeon: Susa Day, MD;  Location: [facility] ORS;  Service: Orthopedics;  Laterality: Right;  90 mins  . thyroid cyst apiration  05/1997 and 07/1998  . TONSILECTOMY, ADENOIDECTOMY, BILATERAL MYRINGOTOMY AND TUBES  1989  . TONSILLECTOMY    . TOTAL ABDOMINAL HYSTERECTOMY  1992    Prior to Admission medications   Medication Sig Start Date End Date Taking? Authorizing Provider  ALPRAZolam (XANAX) 0.25 MG tablet Take 1 tablet (0.25 mg  total) by mouth at bedtime as needed. Patient taking differently: Take 0.25 mg by mouth at bedtime as needed for sleep.  10/17/19  Yes Martinique, Peter M, MD  amLODipine (NORVASC) 5 MG tablet TAKE 1 TABLET BY MOUTH EVERY DAY 06/12/20  Yes Martinique, Peter M, MD  aspirin EC 81 MG tablet Take 1 tablet (81 mg total) by mouth daily. Patient taking differently: Take 325 mg by mouth daily.  05/09/20  Yes Martinique, Peter M, MD  atorvastatin (LIPITOR) 80 MG tablet Take 1 tablet (80 mg total) by mouth daily. Patient taking differently: Take 80 mg by mouth at bedtime.  08/29/19  Yes Martinique, Peter M, MD  augmented betamethasone dipropionate (DIPROLENE-AF) 0.05 % cream Apply 1 application topically daily as needed (rash). For hands  10/01/15  Yes [provider]  B Complex-Biotin-FA (B-100 COMPLEX PO) Take 1 tablet by mouth at bedtime.    Yes [provider]  Budeson-Glycopyrrol-Formoterol (BREZTRI AEROSPHERE) 160-9-4.8 MCG/ACT AERO Inhale 2 puffs into the lungs in the morning and at bedtime. 02/27/20  Yes [name], MD  ciclopirox (LOPROX) 0.77 % cream Apply 1 application topically daily as needed (hands fungus).    Yes [provider]  clobetasol cream (TEMOVATE) 9.47 % Apply 1 application topically 2 (two) times daily as needed (hands).    Yes [provider]  CORDRAN 4 MCG/SQCM TAPE Apply 1 each topically daily as needed (Rash).  05/17/19  Yes [provider]  cycloSPORINE (RESTASIS) 0.05 % ophthalmic emulsion Place 1 drop into both eyes 2 (two) times daily.    Yes [provider]  gabapentin (NEURONTIN) 300 MG capsule Take 1-2 capsules (300-600 mg total) by mouth 3 (three) times daily. Patient taking differently: Take 300 mg by mouth 3 (three) times daily.  11/15/19  Yes Lomax, Amy, NP  hydrALAZINE (APRESOLINE) 10 MG tablet TAKE 1 TABLET BY MOUTH THREE TIMES A DAY Patient taking differently: Take 10 mg by mouth 3 (three) times daily.  01/21/20  Yes Martinique, Peter M, MD  losartan-hydrochlorothiazide (HYZAAR) 100-25 MG tablet Take 1 tablet by mouth daily. 01/21/20  Yes Martinique, Peter M, MD  Multiple Vitamins-Minerals (MULTIVITAMIN WITH MINERALS) tablet Take 1 tablet by mouth at bedtime.   Yes [provider]  nitroGLYCERIN (NITROSTAT) 0.4 MG SL tablet Place 1 tablet (0.4 mg total) under the tongue every 5 (five) minutes as needed for chest pain. 01/09/20  Yes Martinique, Peter M, MD  omeprazole (PRILOSEC) 40 MG capsule TAKE 1 CAPSULE BY MOUTH TWICE A DAY Patient taking differently: Take 40 mg by mouth 2 (two) times daily.  10/19/19  Yes Ladene Artist, MD  potassium chloride (KLOR-CON) 10 MEQ tablet TAKE 2 TABLETS (20 MEQ TOTAL) BY MOUTH 2 (TWO) TIMES DAILY. 04/22/20   Yes Martinique, Peter M, MD  torsemide (DEMADEX) 20 MG tablet Take 1/2 tablet daily Patient taking differently: Take 10 mg by mouth every other day.  02/18/20  Yes Martinique, Peter M, MD  tretinoin (RETIN-A) 0.1 % cream Apply 1 application topically every other day. In the evening   Yes [provider]  VITAMIN D, ERGOCALCIFEROL, PO Take 1,000 Units by mouth every evening.    Yes [provider]  Accu-Chek FastClix Lancets MISC Check blood sugar once daily PRN 07/30/19   [name], MD  albuterol (PROVENTIL HFA;VENTOLIN HFA) 108 (90 Base) MCG/ACT inhaler Inhale 2 puffs into the lungs every 4 (four) hours as needed for wheezing or shortness of breath. 11/13/18   Billey Chang  L, MD  glucose blood (ACCU-CHEK SMARTVIEW) test strip CHECK BLOOD SUGAR ONCE DAILY AS NEEDED 01/17/20   [name], MD   Allergies  Allergen Reactions  . Anticoagulant Compound Itching and Other (See Comments)    Hands and feet tingle and itch.   . Morphine And Related Nausea Only and Itching  . Bextra [Valdecoxib] Other (See Comments)    Increased lft's Increased lft's  . Hydrocod Polst-Cpm Polst Er Rash  . Lipitor [Atorvastatin] Other (See Comments)    Leg weakness   . Mevacor [Lovastatin] Other (See Comments)    Muscle weakness   . Plavix  [Clopidogrel Bisulfate] Itching and Other (See Comments)    Hands and feet tingle and itch.  . Zocor [Simvastatin] Other (See Comments)    Bones hurt   . Doxycycline Diarrhea    N/v/d  . Metoprolol Other (See Comments)    Hair loss  . Plavix [Clopidogrel Bisulfate] Other (See Comments)    Itching of hands and feet  . Codeine Itching and Rash    Hyper-active  . Hydrocod Polst-Cpm Polst Er Rash  . Lisinopril Cough    Social History   Tobacco Use  . Smoking status: Former Smoker    Packs/day: 0.00    Years: 37.00    Pack years: 0.00    Types: Cigarettes    Quit date: 12/07/1995    Years since quitting: 24.5  . Smokeless tobacco: Never Used   Substance Use Topics  . Alcohol use: Yes    Alcohol/week: 6.0 - 10.0 standard drinks    Types: 6 - 10 Glasses of wine per week    Comment: 2-3 glasses daily    Family History  Problem Relation Age of Onset  . Heart disease Mother   . Heart attack Mother   . Heart disease Father        before age 43  . Heart attack Father   . Heart disease Sister        before age 96  . Diabetes Sister   . Hyperlipidemia Sister   . Hypertension Sister   . Heart attack Sister   . Liver cancer Sister   . Breast cancer Sister   . Heart disease Brother   . Diabetes Brother   . Hyperlipidemia Brother   . Hypertension Brother   . Heart attack Brother   . AAA (abdominal aortic aneurysm) Brother   . Colon cancer Paternal Grandmother   . Heart disease Brother   . Colon cancer Cousin        first cousin on father's side  . Pancreatic cancer Neg Hx   . Rectal cancer Neg Hx   . Stomach cancer Neg Hx   . Esophageal cancer Neg Hx      Review of Systems  Positive ROS: neg  All other systems have been reviewed and were otherwise negative with the exception of those mentioned in the HPI and as above.  Objective: Vital signs in last 24 hours: Temp:  [98.2 F (36.8 C)] 98.2 F (36.8 C) (07/14 0658) Pulse Rate:  [76] 76 (07/14 0657) Resp:  [17] 17 (07/14 0657) BP: (150)/(46) 150/46 (07/14 0657) SpO2:  [96 %] 96 % (07/14 0657) Weight:  [69.3 kg] 69.3 kg (07/14 0726)  General Appearance: Alert, cooperative, no distress, appears stated age Head: Normocephalic, without obvious abnormality, atraumatic Eyes: PERRL, conjunctiva/corneas clear, EOM's intact    Neck: Supple, symmetrical, trachea midline Back: Symmetric, no curvature, ROM normal, no CVA tenderness Lungs:  respirations unlabored Heart: Regular rate and rhythm Abdomen: Soft, non-tender Extremities: Extremities normal, atraumatic, no cyanosis or edema Pulses: 2+ and symmetric all extremities Skin: Skin color, texture, turgor normal, no  rashes or lesions  NEUROLOGIC:   Mental status: Alert and oriented x4,  no aphasia, good attention span, fund of knowledge, and memory Motor Exam - grossly normal Sensory Exam - grossly normal Reflexes: 1+ Coordination - grossly normal Gait - grossly normal Balance - grossly normal Cranial Nerves: I: smell Not tested  II: visual acuity  OS: nl    OD: nl  II: visual fields Full to confrontation  II: pupils Equal, round, reactive to light  III,VII: ptosis None  III,IV,VI: extraocular muscles  Full ROM  V: mastication Normal  V: facial light touch sensation  Normal  V,VII: corneal reflex  Present  VII: facial muscle function - upper  Normal  VII: facial muscle function - lower Normal  VIII: hearing Not tested  IX: soft palate elevation  Normal  IX,X: gag reflex Present  XI: trapezius strength  5/5  XI: sternocleidomastoid strength 5/5  XI: neck flexion strength  5/5  XII: tongue strength  Normal    Data Review Lab Results  Component Value Date   WBC 9.4 06/13/2020   HGB 14.8 06/13/2020   HCT 43.2 06/13/2020   MCV 87.7 06/13/2020   PLT 234.0 06/13/2020   Lab Results  Component Value Date   NA 138 06/13/2020   K 3.6 06/13/2020   CL 100 06/13/2020   CO2 29 06/13/2020   BUN 11 06/13/2020   CREATININE 0.73 06/13/2020   GLUCOSE 108 (H) 06/13/2020   Lab Results  Component Value Date   INR 1.0 06/16/2020    Assessment/Plan:  Estimated body mass index is 29.82 kg/m as calculated from the following:   Height as of this encounter: 5' (1.524 m).   Weight as of this encounter: 69.3 kg. Patient admitted for PLIF L3-4 L4-5. Patient has failed a reasonable attempt at conservative therapy.  I explained the condition and procedure to the patient and answered any questions.  Patient wishes to proceed with procedure as planned. Understands risks/ benefits and typical outcomes of procedure.   Eustace Moore 06/18/2020 8:18 AM

## 2020-06-18 NOTE — OR Nursing (Signed)
AT end of case and after patient was positioned back on stretcher the CRNA noted some marks on patients right upper arm right above blood pressure cuff. Took cuff off and there were several bruising  marks on upper arm where cuff was. Cuff was taken off the right arm and placed on left arm. No broken areas noted . DDay RN

## 2020-06-18 NOTE — Transfer of Care (Signed)
Immediate Anesthesia Transfer of Care Note  Patient: Megan [name]  Procedure(s) Performed: POSTERIOR LUMBAR INTERBODY FUSION LUMBAR THREE-FOUR, LUMBAR FOUR-FIVE. (N/A Spine Lumbar)  Patient Location: PACU  Anesthesia Type:General  Level of Consciousness: drowsy and responds to stimulation  Airway & Oxygen Therapy: Patient Spontanous Breathing and Patient connected to face mask oxygen  Post-op Assessment: Report given to RN, Post -op Vital signs reviewed and stable and Patient moving all extremities X 4  Post vital signs: Reviewed and stable  Last Vitals:  Vitals Value Taken Time  BP 125/49 06/18/20 1250  Temp    Pulse 85 06/18/20 1252  Resp 26 06/18/20 1252  SpO2 99 % 06/18/20 1252  Vitals shown include unvalidated device data.  Last Pain:  Vitals:   06/18/20 0726  PainSc: 4       Patients Stated Pain Goal: 3 (02/[birth year] 6986)  Complications: No complications documented.

## 2020-06-19 LAB — GLUCOSE, CAPILLARY: Glucose-Capillary: 124 mg/dL — ABNORMAL HIGH (ref 70–99)

## 2020-06-19 MED ORDER — OXYCODONE-ACETAMINOPHEN 10-325 MG PO TABS: 1.0000 | ORAL_TABLET | Freq: Four times a day (QID) | ORAL | 0 refills | Status: DC | PRN

## 2020-06-19 MED ORDER — METHOCARBAMOL 500 MG PO TABS: 500.0000 mg | ORAL_TABLET | Freq: Four times a day (QID) | ORAL | 0 refills | Status: DC

## 2020-06-19 NOTE — Progress Notes (Signed)
Physical Therapy Evaluation Charges    06/19/20 1400  PT Time Calculation  PT Start Time (ACUTE ONLY) 0800  PT Stop Time (ACUTE ONLY) 5521  PT Time Calculation (min) (ACUTE ONLY) 33 min  PT General Charges  $$ ACUTE PT VISIT 1 Visit  PT Evaluation  $PT Eval Low Complexity 1 Low  PT Treatments  $Gait Training 8-22 mins   Rolinda Roan, PT, DPT Acute Rehabilitation Services Pager: [phone] Office: [phone]

## 2020-06-19 NOTE — Progress Notes (Signed)
PHYSICAL THERAPY EVALUATION   CLINICAL IMPRESSION: Pt is a pleasant 74 yo female s/p L3/4 and L4/5 PLIF. Pt had a moderate amount of LBP (2/10) while in reclining chair. Pt was able to demonstrate proper body mechanics with bed mobility and transfer technique while supervised without an AD. Pt showed that she was able to be functional with balance, ambulation and stair climbing. Due to her current positive functional status, patient was able to complete tasks with just supervision. Pt has family and spouse at home who are able to be of assistance to her in need. Based on her functional status and assistance at home, pt is appropriate for D/C and return to home for further recovery      06/19/20 0912  PT Visit Information  Last PT Received On 06/19/20  Assistance Needed +1  History of Present Illness Pt is a 74 year old woman admitted for L 3-4 4-5 PLIF. PMH: COPD, AAA, CAD, MI s/p CABG, DM, HTN, OA, PAD, fibromyalgia, HOH.  Precautions  Precautions Back  Precaution Booklet Issued Yes (comment) (no bending, lifting over 5 lbs and no twisting)  Required Braces or Orthoses Spinal Brace  Spinal Brace Applied in sitting position;Lumbar corset  Restrictions  Weight Bearing Restrictions No  Home Living  Family/patient expects to be discharged to: Private residence  Living Arrangements Spouse/significant other  Available Help at Discharge Family  Type of [place] to enter  Entrance Stairs-Number of Steps 5  Entrance Stairs-Rails Right  Home Layout Multi-level  Alternate Level Stairs-Number of Steps 15  Alternate Level Stairs-Rails Right  Bathroom Shower/Tub Walk-in Automotive engineer (handicapped height for main floor bathrooms )  Bathroom Accessibility Yes  Home Equipment None  Additional Comments pt lives in [place] home but only needs to go up to the top where the master bedroom is located. Pt has the 15 stairs to climb with a short landing in the  middle.. pt also has handicapped height toilets on main floor   Prior Function  Level of Independence Independent  Communication  Communication No difficulties  Pain Assessment  Pain Assessment 0-10  Pain Score 4  Pain Location back  Pain Descriptors / Indicators Aching;Burning  Pain Intervention(s) Monitored during session;Repositioned;Limited activity within patient's tolerance  Cognition  Arousal/Alertness Awake/alert  Behavior During Therapy WFL for tasks assessed/performed  Overall Cognitive Status Within Functional Limits for tasks assessed  Upper Extremity Assessment  Upper Extremity Assessment Defer to OT evaluation  Lower Extremity Assessment  Lower Extremity Assessment Overall WFL for tasks assessed  Cervical / Trunk Assessment  Cervical / Trunk Assessment Normal (s/p lumbar surgery)  Bed Mobility  Overal bed mobility Needs Assistance  Bed Mobility Rolling;Sidelying to Sit;Sit to Sidelying  Rolling Supervision  Sidelying to sit Supervision  Sit to sidelying Supervision  General bed mobility comments pt needed cueing for proper hand placement with sidelying to sit, pt attempted to push off bed with contralateral hand to twist trunk; pt was able to perform proper bed mobility on second attempt with supervision  Transfers  Overall transfer level Needs assistance  Equipment used None  Transfers Sit to/from Stand  Sit to Stand Supervision  General transfer comment pt was quick with sit<>stand transfer and just needed supervision to ensure she achieved full standing  Ambulation/Gait  Ambulation/Gait assistance Supervision  Gait Distance (Feet) 350 Feet  Assistive device None  Gait Pattern/deviations Step-through pattern  General Gait Details pt demonstrated proper gait mechanics and was able to  ambulate well   Gait velocity decreased  Gait velocity interpretation <1.8 ft/sec, indicate of risk for recurrent falls  Stairs Yes  Stairs assistance Supervision  Stair  Management One rail Right  Number of Stairs 10  General stair comments pt stair climbed with supervision with a step to gait pattern. pt was educated on proper stair ascending/descending technique.  Balance  Overall balance assessment Needs assistance  Sitting-balance support No upper extremity supported;Feet unsupported  Sitting balance-Leahy Scale Good  Sitting balance - Comments pt was able to maintain sitting posture to adjust brace without LOB  Standing balance support No upper extremity supported  Standing balance-Leahy Scale Good  Standing balance comment patient was able to stand after transfer to have brace and gait belt adjusted without LOB, pt was able to demonstrated a single leg stance and leg swing as she was demonstrating car transfer mechanics  PT - End of Session  Equipment Utilized During Treatment Gait belt;Back brace  Activity Tolerance Patient tolerated treatment well  Patient left with call Dorien Bessent/phone within reach;in chair;with chair alarm set (OT was present to perform their assessment with her)  Nurse Communication Mobility status;Weight bearing status (no need for hospital bed )  PT Assessment  PT Recommendation/Assessment Patent does not need any further PT services  PT Visit Diagnosis Pain;Muscle weakness (generalized) (M62.81)  Pain - part of body  (back)  PT Problem List Decreased strength;Decreased range of motion;Decreased activity tolerance;Decreased balance;Decreased mobility;Decreased coordination;Decreased safety awareness;Decreased knowledge of precautions;Pain  AM-PAC PT "6 Clicks" Mobility Outcome Measure (Version 2)  Help needed turning from your back to your side while in a flat bed without using bedrails? 4  Help needed moving from lying on your back to sitting on the side of a flat bed without using bedrails? 4  Help needed moving to and from a bed to a chair (including a wheelchair)? 4  Help needed standing up from a chair using your arms (e.g.,  wheelchair or bedside chair)? 4  Help needed to walk in hospital room? 3  Help needed climbing 3-5 steps with a railing?  3  6 Click Score 22  Consider Recommendation of Discharge To: Home with no services  PT Recommendation  Follow Up Recommendations No PT follow up  PT equipment None recommended by PT  Individuals Consulted  Consulted and Agree with Results and Recommendations Patient  Acute Rehab PT Goals  Patient Stated Goal return to home and PLOF  PT Goal Formulation All assessment and education complete, DC therapy  PT Time Calculation  PT Start Time (ACUTE ONLY) 0800  PT Stop Time (ACUTE ONLY) 2992  PT Time Calculation (min) (ACUTE ONLY) 33 min    Gloriann Loan, SPT  Acute Rehabilitation Services  Office: [phone]

## 2020-06-19 NOTE — Progress Notes (Signed)
Pt doing well. Pt given D/C instructions with verbal understanding. Rx's were sent to the pharmacy by MD. Pt's incision is clean and dry with no sign of infection. Pt's IV was removed prior to D/C. Pt received 3-n-1 per MD order. Pt D/C'd home via wheelchair per MD order. Pt is stable @ D/C and has no other needs at this time. Holli Humbles, RN

## 2020-06-19 NOTE — Discharge Summary (Signed)
Physician Discharge Summary  Patient ID: Megan [name] MRN: [id number] DOB/AGE: 1945-12-18 74 y.o.  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 The patient tolerated the procedure well and was taken to the recovery room and then to the floor in stable condition. The hospital course was routine. There were no complications. The wound remained clean dry and intact. Pt had appropriate back soreness. No complaints of leg pain or new N/T/W. The patient remained afebrile with stable vital signs, and tolerated a regular diet. The patient continued to increase activities, and pain was well controlled with oral pain medications.   Consults: None  Significant Diagnostic Studies:  Results for orders placed or performed during the hospital encounter of 06/18/20  Glucose, capillary  Result Value Ref Range   Glucose-Capillary 139 (H) 70 - 99 mg/dL   Comment 1 Notify RN   Glucose, capillary  Result Value Ref Range   Glucose-Capillary 168 (H) 70 - 99 mg/dL  Glucose, capillary  Result Value Ref Range   Glucose-Capillary 181 (H) 70 - 99 mg/dL  Glucose, capillary  Result Value Ref Range   Glucose-Capillary 127 (H) 70 - 99 mg/dL   Comment 1 Notify RN    Comment 2 Document in Chart   Glucose, capillary  Result Value Ref Range   Glucose-Capillary 124 (H) 70 - 99 mg/dL   Comment 1 Notify RN    Comment 2 Document in Chart     Chest 2 View  Result Date: 06/16/2020 CLINICAL DATA:  Preop for lumbar surgery. EXAM: CHEST - 2 VIEW COMPARISON:  November 15, 2014. FINDINGS: The heart size and mediastinal contours are within normal limits. Both lungs are clear. Status post coronary bypass graft.  The visualized skeletal structures are unremarkable. IMPRESSION: No active cardiopulmonary disease. Aortic Atherosclerosis (ICD10-I70.0). Electronically Signed   By: Marijo Conception M.D.   On: 06/16/2020 15:56   DG Lumbar Spine 2-3 Views  Result Date: 06/18/2020 CLINICAL DATA:  Surgical posterior fusion. EXAM: DG C-ARM 1-60 MIN; LUMBAR SPINE - 2-3 VIEW CONTRAST:  None. FLUOROSCOPY TIME:  Radiation Exposure Index (if provided by the fluoroscopic device): 48.91 mGy. COMPARISON:  February 26, 2020. FINDINGS: Two intraoperative fluoroscopic images demonstrate the patient be status post surgical posterior fusion of L3-4 and L4-5 with bilateral intrapedicular screw placement and interbody fusion. Good alignment of vertebral bodies is noted. IMPRESSION: Status post surgical posterior fusion of L3-4 and L4-5. Electronically Signed   By: Marijo Conception M.D.   On: 06/18/2020 15:50   DG C-Arm 1-60 Min  Result Date: 06/18/2020 CLINICAL DATA:  Surgical posterior fusion. EXAM: DG C-ARM 1-60 MIN; LUMBAR SPINE - 2-3 VIEW CONTRAST:  None. FLUOROSCOPY TIME:  Radiation Exposure Index (if provided by the fluoroscopic device): 48.91 mGy. COMPARISON:  February 26, 2020. FINDINGS: Two intraoperative fluoroscopic images demonstrate the patient be status post surgical posterior fusion of L3-4 and L4-5 with bilateral intrapedicular screw placement and interbody fusion. Good alignment of vertebral bodies is noted. IMPRESSION: Status post surgical posterior fusion of L3-4 and L4-5. Electronically Signed   By: Marijo Conception M.D.   On: 06/18/2020 15:50    Antibiotics:  Anti-infectives (From admission, onward)   Start     Dose/Rate Route  Frequency Ordered Stop   06/18/20 1630  ceFAZolin (ANCEF) IVPB 2g/100 mL premix        2 g 200 mL/hr over 30 Minutes Intravenous Every 8 hours 06/18/20 1437 06/18/20 2330   06/18/20 1004  bacitracin 50,000 Units in sodium chloride 0.9 % 500 mL irrigation  Status:  Discontinued          As needed  06/18/20 1005 06/18/20 1246   06/18/20 0645  ceFAZolin (ANCEF) IVPB 2g/100 mL premix        2 g 200 mL/hr over 30 Minutes Intravenous On call to O.R. 06/18/20 3762 06/18/20 0850      Discharge Exam: Blood pressure (!) 107/41, pulse 73, temperature 98.7 F (37.1 C), temperature source Oral, resp. rate 18, height 5' (1.524 m), weight 69.3 kg, SpO2 93 %. Neurologic: Grossly normal Ambulating and voiding well, incision cdi   Discharge Medications:   Allergies as of 06/19/2020      Reactions   Anticoagulant Compound Itching, Other (See Comments)   Hands and feet tingle and itch.   Morphine And Related Nausea Only, Itching   Bextra [valdecoxib] Other (See Comments)   Increased lft's Increased lft's   Hydrocod Polst-cpm Polst Er Rash   Lipitor [atorvastatin] Other (See Comments)   Leg weakness   Mevacor [lovastatin] Other (See Comments)   Muscle weakness   Plavix  [clopidogrel Bisulfate] Itching, Other (See Comments)   Hands and feet tingle and itch.   Zocor [simvastatin] Other (See Comments)   Bones hurt   Doxycycline Diarrhea   N/v/d   Metoprolol Other (See Comments)   Hair loss   Plavix [clopidogrel Bisulfate] Other (See Comments)   Itching of hands and feet   Codeine Itching, Rash   Hyper-active   Hydrocod Polst-cpm Polst Er Rash   Lisinopril Cough      Medication List    TAKE these medications   Accu-Chek FastClix Lancets Misc Check blood sugar once daily PRN   Accu-Chek SmartView test strip Generic drug: glucose blood CHECK BLOOD SUGAR ONCE DAILY AS NEEDED   albuterol 108 (90 Base) MCG/ACT inhaler Commonly known as: VENTOLIN HFA Inhale 2 puffs into the lungs every 4 (four) hours as needed for wheezing or shortness of breath.   ALPRAZolam 0.25 MG tablet Commonly known as: XANAX Take 1 tablet (0.25 mg total) by mouth at bedtime as needed. What changed: reasons to take this   amLODipine 5 MG tablet Commonly known as: NORVASC TAKE 1 TABLET BY MOUTH EVERY  DAY   aspirin EC 81 MG tablet Take 1 tablet (81 mg total) by mouth daily. What changed: how much to take   atorvastatin 80 MG tablet Commonly known as: LIPITOR Take 1 tablet (80 mg total) by mouth daily. What changed: when to take this   augmented betamethasone dipropionate 0.05 % cream Commonly known as: DIPROLENE-AF Apply 1 application topically daily as needed (rash). For hands   B-100 COMPLEX PO Take 1 tablet by mouth at bedtime.   Breztri Aerosphere 160-9-4.8 MCG/ACT Aero Generic drug: Budeson-Glycopyrrol-Formoterol Inhale 2 puffs into the lungs in the morning and at bedtime.   ciclopirox 0.77 % cream Commonly known as: LOPROX Apply 1 application topically daily as needed (hands fungus).   clobetasol cream 0.05 % Commonly known as: TEMOVATE Apply 1 application topically 2 (two) times daily as needed (hands).   Cordran 4 MCG/SQCM Tape Generic drug: Flurandrenolide Apply 1 each topically daily as needed (Rash).   gabapentin 300 MG capsule Commonly known as: NEURONTIN  Take 1-2 capsules (300-600 mg total) by mouth 3 (three) times daily. What changed: how much to take   hydrALAZINE 10 MG tablet Commonly known as: APRESOLINE TAKE 1 TABLET BY MOUTH THREE TIMES A DAY   losartan-hydrochlorothiazide 100-25 MG tablet Commonly known as: HYZAAR Take 1 tablet by mouth daily.   methocarbamol 500 MG tablet Commonly known as: Robaxin Take 1 tablet (500 mg total) by mouth 4 (four) times daily.   multivitamin with minerals tablet Take 1 tablet by mouth at bedtime.   nitroGLYCERIN 0.4 MG SL tablet Commonly known as: NITROSTAT Place 1 tablet (0.4 mg total) under the tongue every 5 (five) minutes as needed for chest pain.   omeprazole 40 MG capsule Commonly known as: PRILOSEC TAKE 1 CAPSULE BY MOUTH TWICE A DAY What changed:   how much to take  how to take this  when to take this  additional instructions   oxyCODONE-acetaminophen 10-325 MG tablet Commonly known  as: Percocet Take 1 tablet by mouth every 6 (six) hours as needed for pain.   potassium chloride 10 MEQ tablet Commonly known as: KLOR-CON TAKE 2 TABLETS (20 MEQ TOTAL) BY MOUTH 2 (TWO) TIMES DAILY.   Restasis 0.05 % ophthalmic emulsion Generic drug: cycloSPORINE Place 1 drop into both eyes 2 (two) times daily.   torsemide 20 MG tablet Commonly known as: DEMADEX Take 1/2 tablet daily What changed:   how much to take  how to take this  when to take this  additional instructions   tretinoin 0.1 % cream Commonly known as: RETIN-A Apply 1 application topically every other day. In the evening   VITAMIN D (ERGOCALCIFEROL) PO Take 1,000 Units by mouth every evening.            Durable Medical Equipment  (From admission, onward)         Start     Ordered   06/18/20 1438  DME Walker rolling  Once       Question:  Patient needs a walker to treat with the following condition  Answer:  S/P lumbar fusion   06/18/20 1437   06/18/20 1438  DME 3 n 1  Once        06/18/20 1437          Disposition: home   Final Dx: PLIF L3-4, L4-5  Discharge Instructions     Remove dressing in 72 hours   Complete by: As directed    Call MD for:  difficulty breathing, headache or visual disturbances   Complete by: As directed    Call MD for:  hives   Complete by: As directed    Call MD for:  persistant dizziness or light-headedness   Complete by: As directed    Call MD for:  persistant nausea and vomiting   Complete by: As directed    Call MD for:  redness, tenderness, or signs of infection (pain, swelling, redness, odor or green/yellow discharge around incision site)   Complete by: As directed    Call MD for:  severe uncontrolled pain   Complete by: As directed    Call MD for:  temperature >100.4   Complete by: As directed    Diet - low sodium heart healthy   Complete by: As directed    Driving Restrictions   Complete by: As directed    No driving for 2 weeks, no riding in  the car for 1 week   Increase activity slowly   Complete by: As directed  Lifting restrictions   Complete by: As directed    No lifting more than 8 lbs         Signed: Ocie Cornfield Omar Gayden 06/19/2020, 7:51 AM

## 2020-06-19 NOTE — Evaluation (Signed)
Occupational Therapy Evaluation and Discharge Patient Details Name: Megan [name] MRN: [id number] DOB: December 18, 1945 Today's Date: 06/19/2020    History of Present Illness Pt is a 74 year old woman admitted for L 3-4 4-5 PLIF. PMH: COPD, AAA, CAD, MI s/p CABG, DM, HTN, OA, PAD, fibromyalgia, HOH.   Clinical Impression   Pt was independent prior to admission. Presents with post operative pain. Educated pt back precautions related to ADL and IADL, multiple benefits of 3 in 1 and compensatory strategies so pt may maintain her back precautions. Pt receptive and verbalized and/or demonstrated understanding of all information. No further OT needs.    Follow Up Recommendations  No OT follow up    Equipment Recommendations  3 in 1 bedside commode    Recommendations for Other Services       Precautions / Restrictions Precautions Precautions: Back Precaution Booklet Issued: Yes (comment) Required Braces or Orthoses: Spinal Brace Spinal Brace: Lumbar corset;Applied in sitting position      Mobility Bed Mobility  Transfers Overall transfer level: Needs assistance Equipment used: None Transfers: Sit to/from Stand Sit to Stand: Supervision             Balance Overall balance assessment: Needs assistance   Sitting balance-Leahy Scale: Good       Standing balance-Leahy Scale: Good                             ADL either performed or assessed with clinical judgement   ADL Overall ADL's : Modified independent                                       General ADL Comments: Educated pt at length in IADL to avoid, body mechanics and compensatory strategies for ADL.     Vision Patient Visual Report: No change from baseline       Perception     Praxis      Pertinent Vitals/Pain Pain Assessment: 0-10 Pain Score: 4  Pain Location: back Pain Descriptors / Indicators: Aching;Burning Pain Intervention(s): Monitored during session;Premedicated  before session;Repositioned     Hand Dominance Right   Extremity/Trunk Assessment Upper Extremity Assessment Upper Extremity Assessment: Overall WFL for tasks assessed   Lower Extremity Assessment Lower Extremity Assessment: Defer to PT evaluation   Cervical / Trunk Assessment Cervical / Trunk Assessment: Normal   Communication Communication Communication: HOH (with hearing aids)   Cognition Arousal/Alertness: Awake/alert Behavior During Therapy: WFL for tasks assessed/performed Overall Cognitive Status: Within Functional Limits for tasks assessed                                     General Comments       Exercises     Shoulder Instructions      Home Living Family/patient expects to be discharged to:: Private residence Living Arrangements: Spouse/significant other Available Help at Discharge: Family;Available 24 hours/day (brother and sister in law staying with her ) Type of Home: House Home Access: Stairs to enter [employer] of Steps: 5 Entrance Stairs-Rails: Right Home Layout: Multi-level Alternate Level Stairs-Number of Steps: 15 Alternate Level Stairs-Rails: Right Bathroom Shower/Tub: Occupational psychologist: Standard (handicapped height upstairs) Bathroom Accessibility: Yes   Home Equipment: Radiation protection practitioner Equipment: Reacher Additional Comments: pt lives in  3-story home but only needs to go up to the top where the master bedroom is located. Pt has the 15 stairs to climb with a short landing in the middle.. 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, has a housekeeper who comes intermittently        OT Problem List:        OT Treatment/Interventions:      OT Goals(Current goals can be found in the care plan section) Acute Rehab OT Goals Patient Stated Goal: return to  [facility]  OT Frequency:     Barriers to D/C:            Co-evaluation              AM-PAC OT "6 Clicks" Daily Activity     Outcome Measure Help from another person eating meals?: None Help from another person taking care of personal grooming?: None Help from another person toileting, which includes using toliet, bedpan, or urinal?: None Help from another person bathing (including washing, rinsing, drying)?: None Help from another person to put on and taking off regular upper body clothing?: None Help from another person to put on and taking off regular lower body clothing?: None 6 Click Score: 24   End of Session Equipment Utilized During Treatment: Back brace  Activity Tolerance: Patient tolerated treatment well Patient left: in chair;with call bell/phone within reach  OT Visit Diagnosis: Pain                Time: 9024-0973 OT Time Calculation (min): 25 min Charges:  OT General Charges $OT Visit: 1 Visit OT Evaluation $OT Eval Low Complexity: 1 Low OT Treatments $Self Care/Home Management : 8-22 mins  Nestor Lewandowsky, OTR/L Acute Rehabilitation Services Pager: [phone] Office: [phone] Malka So 06/19/2020, 9:35 AM

## 2020-06-20 ENCOUNTER — Telehealth: Payer: Self-pay

## 2020-06-20 NOTE — Telephone Encounter (Cosign Needed)
Transition Care Management Follow-up Telephone Call  Date of discharge and from where: 06/19/20 [facility]   How have you been since you were released from the hospital? Resting well  Any questions or concerns? No   Items Reviewed:  Did the pt receive and understand the discharge instructions provided? Yes   Medications obtained and verified? Yes   Any new allergies since your discharge? No   Dietary orders reviewed? Yes  Do you have support at home? Yes  sister in law is there helping while she recovers  Functional Questionnaire: (I = Independent and D = Dependent) ADLs: I  Bathing/Dressing- I  Meal Prep- I  Eating- I  Maintaining continence- I  Transferring/Ambulation- I  Managing Meds- I  Follow up appointments reviewed:   PCP Hospital f/u appt confirmed? No    Specialist Hospital f/u appt confirmed? Yes  Scheduled to see Dr Ronnald Ramp  on 07/01/20  Are transportation arrangements needed? No   If their condition worsens, is the pt aware to call PCP or go to the Emergency Dept.? Yes  Was the patient provided with contact information for the PCP's office or ED? Yes  Was to pt encouraged to call back with questions or concerns? Yes

## 2020-06-23 ENCOUNTER — Other Ambulatory Visit: Payer: Self-pay

## 2020-06-23 DIAGNOSIS — I714 Abdominal aortic aneurysm, without rupture, unspecified: Secondary | ICD-10-CM

## 2020-06-26 ENCOUNTER — Telehealth (INDEPENDENT_AMBULATORY_CARE_PROVIDER_SITE_OTHER): Payer: Medicare Other | Admitting: Family Medicine

## 2020-06-26 ENCOUNTER — Encounter: Payer: Self-pay | Admitting: Family Medicine

## 2020-06-26 DIAGNOSIS — L0292 Furuncle, unspecified: Secondary | ICD-10-CM

## 2020-06-26 DIAGNOSIS — L989 Disorder of the skin and subcutaneous tissue, unspecified: Secondary | ICD-10-CM

## 2020-06-26 MED ORDER — DOXYCYCLINE HYCLATE 100 MG PO TABS
100.0000 mg | ORAL_TABLET | Freq: Two times a day (BID) | ORAL | 0 refills | Status: DC
Start: 2020-06-26 — End: 2021-01-06

## 2020-06-26 NOTE — Patient Instructions (Signed)
-  I sent the medication(s) we discussed to your pharmacy: Meds ordered this encounter  Medications  . doxycycline (VIBRA-TABS) 100 MG tablet    Sig: Take 1 tablet (100 mg total) by mouth 2 (two) times daily.    Dispense:  10 tablet    Refill:  0    Please let us know if you have any questions or concerns regarding this prescription.  I hope you are feeling better soon! Seek care promptly if your symptoms worsen, new concerns arise or you are not improving with treatment.

## 2020-06-26 NOTE — Progress Notes (Signed)
Virtual Visit via Video Note  I connected with Aurelia  on 06/26/20 at  4:40 PM EDT by a video enabled telemedicine application and verified that I am speaking with the correct person using two identifiers.  Location patient: home, [place] Location provider:work or home office Persons participating in the virtual visit: patient, provider  I discussed the limitations of evaluation and management by telemedicine and the availability of in person appointments. The patient expressed understanding and agreed to proceed.   HPI:  Acute visit for skin lesion: -locate on the external labia Majora -reports has one tender pimple or boil with small area of surrounding redness -denies lesion, tenderness or swelling internally, drainage, malaise or fevers -she has had something similar to this in the past -she thought it was an infected hair follicle -she tried alcohol and neosporin on it and it did not improve -had back surgery recently and does not want to come into the office for this - reports healing well from that and this is not located anywhere near the surgical site -denies allergies to antibiotic, she had mild diarrhea with some doxy - but reports feels can take it, denies liver disease   ROS: See pertinent positives and negatives per HPI.  Past Medical History:  Diagnosis Date  . AAA (abdominal aortic aneurysm) ([place])    a. s/p stent grafting in 2009.  . Adenomatous colon polyp 05/2001  . Adenomatous duodenal polyp   . Allergy   . Anginal pain ([place])   . COPD (chronic obstructive pulmonary disease) ([place])    pt denies COPD  . Coronary artery disease    a. s/p CABG in 1995;  b. 05/2013 Neg MV, EF 82%;  c. 06/2013 Cath: LM nl, LAD 40-50p, LCX nl, RCA 80p/138m, VG->RCA->PDA->PLSA min irregs, LIMA->LAD atretic, vigorous LV fxn.  . Diabetes mellitus without complication (HCC)    history of, resolved. 2017  . Diverticulosis   . Eczema, dyshidrotic   . Eczema, dyshidrotic 1986   Dr. Mathis Fare   . Emphysema of lung ([place])   . Fatty liver   . Fibromyalgia   . GERD (gastroesophageal reflux disease)   . Hyperlipidemia   . Hypertension   . Migraine   . Myocardial infarction ([place])   . Osteoarthritis   . Osteoporosis    unsure, having a bone scan soon  . Peripheral arterial disease (HCC)    left leg diagnosed by Dr Mare Ferrari  . Renal artery stenosis ([place])    a. 2008 s/p PTA    Past Surgical History:  Procedure Laterality Date  . ABDOMINAL AORTAGRAM N/A 03/06/2014   Procedure: ABDOMINAL AORTAGRAM;  Surgeon: Rosetta Posner, MD;  Location: [facility] CATH LAB;  Service: Cardiovascular;  Laterality: N/A;  . ABDOMINAL AORTIC ANEURYSM REPAIR  2009   stenting  . ABDOMINAL HYSTERECTOMY     Total, 1992  . BREAST BIOPSY    . CARDIAC CATHETERIZATION    . CARDIOVASCULAR STRESS TEST  11/11/2009   normal study  . CARPAL TUNNEL RELEASE  08/1993   left wrist  . CARPAL TUNNEL RELEASE  01/1997   right  . CATARACT EXTRACTION, BILATERAL  2021  . CORONARY ARTERY BYPASS GRAFT  07/1994   Triple by Dr Ceasar Mons  . dental implants    . EYE SURGERY  03/2020   cataract  . HAND SURGERY Right 09/2002   Right hand pulley release  . LEFT HEART CATHETERIZATION WITH CORONARY/GRAFT ANGIOGRAM N/A 06/11/2013   Procedure: LEFT HEART CATHETERIZATION WITH CORONARY/GRAFT ANGIOGRAM;  Surgeon: Thayer Headings, MD;  Location: [facility] CATH LAB;  Service: Cardiovascular;  Laterality: N/A;  . MULTIPLE TOOTH EXTRACTIONS  2000   All upper teeth.  Has full dneture.   [name] RENAL ARTERY STENT  2008  . SHOULDER ARTHROSCOPY WITH ROTATOR CUFF REPAIR Right 09/27/2018   Procedure: Right shoulder mini open rotator cuff repair;  Surgeon: Susa Day, MD;  Location: [facility] ORS;  Service: Orthopedics;  Laterality: Right;  90 mins  . thyroid cyst apiration  05/1997 and 07/1998  . TONSILECTOMY, ADENOIDECTOMY, BILATERAL MYRINGOTOMY AND TUBES  1989  . TONSILLECTOMY    . TOTAL ABDOMINAL HYSTERECTOMY  1992    Family History  Problem Relation Age of  Onset  . Heart disease Mother   . Heart attack Mother   . Heart disease Father        before age 54  . Heart attack Father   . Heart disease Sister        before age 44  . Diabetes Sister   . Hyperlipidemia Sister   . Hypertension Sister   . Heart attack Sister   . Liver cancer Sister   . Breast cancer Sister   . Heart disease Brother   . Diabetes Brother   . Hyperlipidemia Brother   . Hypertension Brother   . Heart attack Brother   . AAA (abdominal aortic aneurysm) Brother   . Colon cancer Paternal Grandmother   . Heart disease Brother   . Colon cancer Cousin        first cousin on father's side  . Pancreatic cancer Neg Hx   . Rectal cancer Neg Hx   . Stomach cancer Neg Hx   . Esophageal cancer Neg Hx     SOCIAL HX:see hpi   Current Outpatient Medications:  .  Accu-Chek FastClix Lancets MISC, Check blood sugar once daily PRN, Disp: 100 each, Rfl: 1 .  albuterol (PROVENTIL HFA;VENTOLIN HFA) 108 (90 Base) MCG/ACT inhaler, Inhale 2 puffs into the lungs every 4 (four) hours as needed for wheezing or shortness of breath., Disp: 1 Inhaler, Rfl: 2 .  ALPRAZolam (XANAX) 0.25 MG tablet, Take 1 tablet (0.25 mg total) by mouth at bedtime as needed. (Patient taking differently: Take 0.25 mg by mouth at bedtime as needed for sleep. ), Disp: 90 tablet, Rfl: 1 .  amLODipine (NORVASC) 5 MG tablet, TAKE 1 TABLET BY MOUTH EVERY DAY, Disp: 90 tablet, Rfl: 2 .  aspirin EC 81 MG tablet, Take 1 tablet (81 mg total) by mouth daily. (Patient taking differently: Take 325 mg by mouth daily. ), Disp: 90 tablet, Rfl: 3 .  atorvastatin (LIPITOR) 80 MG tablet, Take 1 tablet (80 mg total) by mouth daily. (Patient taking differently: Take 80 mg by mouth at bedtime. ), Disp: 90 tablet, Rfl: 3 .  augmented betamethasone dipropionate (DIPROLENE-AF) 0.05 % cream, Apply 1 application topically daily as needed (rash). For hands, Disp: , Rfl: 1 .  B Complex-Biotin-FA (B-100 COMPLEX PO), Take 1 tablet by mouth at  bedtime. , Disp: , Rfl:  .  Budeson-Glycopyrrol-Formoterol (BREZTRI AEROSPHERE) 160-9-4.8 MCG/ACT AERO, Inhale 2 puffs into the lungs in the morning and at bedtime., Disp: 10.7 g, Rfl: 5 .  ciclopirox (LOPROX) 0.77 % cream, Apply 1 application topically daily as needed (hands fungus). , Disp: , Rfl:  .  clobetasol cream (TEMOVATE) 2.58 %, Apply 1 application topically 2 (two) times daily as needed (hands). , Disp: , Rfl:  .  CORDRAN 4 MCG/SQCM TAPE, Apply 1 each  topically daily as needed (Rash). , Disp: , Rfl:  .  cycloSPORINE (RESTASIS) 0.05 % ophthalmic emulsion, Place 1 drop into both eyes 2 (two) times daily. , Disp: , Rfl:  .  gabapentin (NEURONTIN) 300 MG capsule, Take 1-2 capsules (300-600 mg total) by mouth 3 (three) times daily. (Patient taking differently: Take 300 mg by mouth 3 (three) times daily. ), Disp: 540 capsule, Rfl: 4 .  glucose blood (ACCU-CHEK SMARTVIEW) test strip, CHECK BLOOD SUGAR ONCE DAILY AS NEEDED, Disp: 100 strip, Rfl: 1 .  hydrALAZINE (APRESOLINE) 10 MG tablet, TAKE 1 TABLET BY MOUTH THREE TIMES A DAY (Patient taking differently: Take 10 mg by mouth 3 (three) times daily. ), Disp: 270 tablet, Rfl: 3 .  losartan-hydrochlorothiazide (HYZAAR) 100-25 MG tablet, Take 1 tablet by mouth daily., Disp: 90 tablet, Rfl: 3 .  methocarbamol (ROBAXIN) 500 MG tablet, Take 1 tablet (500 mg total) by mouth 4 (four) times daily., Disp: 35 tablet, Rfl: 0 .  Multiple Vitamins-Minerals (MULTIVITAMIN WITH MINERALS) tablet, Take 1 tablet by mouth at bedtime., Disp: , Rfl:  .  nitroGLYCERIN (NITROSTAT) 0.4 MG SL tablet, Place 1 tablet (0.4 mg total) under the tongue every 5 (five) minutes as needed for chest pain., Disp: 25 tablet, Rfl: 11 .  omeprazole (PRILOSEC) 40 MG capsule, TAKE 1 CAPSULE BY MOUTH TWICE A DAY (Patient taking differently: Take 40 mg by mouth 2 (two) times daily. ), Disp: 60 capsule, Rfl: 11 .  oxyCODONE-acetaminophen (PERCOCET) 10-325 MG tablet, Take 1 tablet by mouth every  6 (six) hours as needed for pain., Disp: 20 tablet, Rfl: 0 .  potassium chloride (KLOR-CON) 10 MEQ tablet, TAKE 2 TABLETS (20 MEQ TOTAL) BY MOUTH 2 (TWO) TIMES DAILY., Disp: 360 tablet, Rfl: 3 .  torsemide (DEMADEX) 20 MG tablet, Take 1/2 tablet daily (Patient taking differently: Take 10 mg by mouth every other day. ), Disp: 45 tablet, Rfl: 3 .  tretinoin (RETIN-A) 0.1 % cream, Apply 1 application topically every other day. In the evening, Disp: , Rfl:  .  VITAMIN D, ERGOCALCIFEROL, PO, Take 1,000 Units by mouth every evening. , Disp: , Rfl:  .  doxycycline (VIBRA-TABS) 100 MG tablet, Take 1 tablet (100 mg total) by mouth 2 (two) times daily., Disp: 10 tablet, Rfl: 0  EXAM:  VITALS per patient if applicable:  GENERAL: alert, oriented, appears well and in no acute distress  HEENT: atraumatic, conjunttiva clear, no obvious abnormalities on inspection of external nose and ears  NECK: normal movements of the head and neck  LUNGS: on inspection no signs of respiratory distress, breathing rate appears normal, no obvious gross SOB, gasping or wheezing  CV: no obvious cyanosis  MS: moves all visible extremities without noticeable abnormality  SKIN: can not examine private areas on video visits as do not have chaperone - she used fingers to describe the location and area per HPI  PSYCH/NEURO: pleasant and cooperative, no obvious depression or anxiety, speech and thought processing grossly intact  ASSESSMENT AND PLAN:  Discussed the following assessment and plan:  Skin lesion  Boil  -we discussed possible serious and likely etiologies, options for evaluation and workup, limitations of telemedicine visit vs in person visit, treatment, treatment risks and precautions. Pt prefers to treat via telemedicine empirically rather then risking or undertaking an in person visit at this moment. Discussed trying topical abx and compresses vs oral abx. She opted for oral doxy as has already tried a  topical abx at home. She has a listed  issue with diarrhea with this but reports was mild and feels can try it again as tends to get this with abx anyway. Patient agrees to seek prompt in person care if worsening, new symptoms arise, or if is not improving promptly with treatment.   I discussed the assessment and treatment plan with the patient. The patient was provided an opportunity to ask questions and all were answered. The patient agreed with the plan and demonstrated an understanding of the instructions.   The patient was advised to call back or seek an in-person evaluation if the symptoms worsen or if the condition fails to improve as anticipated.   Lucretia Kern, DO

## 2020-07-08 DIAGNOSIS — H04122 Dry eye syndrome of left lacrimal gland: Secondary | ICD-10-CM | POA: Diagnosis not present

## 2020-07-08 DIAGNOSIS — H04121 Dry eye syndrome of right lacrimal gland: Secondary | ICD-10-CM | POA: Diagnosis not present

## 2020-07-22 ENCOUNTER — Ambulatory Visit: Payer: Medicare Other | Admitting: Vascular Surgery

## 2020-07-23 ENCOUNTER — Other Ambulatory Visit: Payer: Self-pay | Admitting: Cardiology

## 2020-07-23 NOTE — Telephone Encounter (Signed)
*  STAT* If patient is at the pharmacy, call can be transferred to refill team.   1. Which medications need to be refilled? (please list name of each medication and dose if known)  ALPRAZolam (XANAX) 0.25 MG tablet  2. Which pharmacy/location (including street and city if local pharmacy) is medication to be sent to? [id number] - [place], [place] - [address]  3. Do they need a 30 day or 90 day supply? [place]

## 2020-07-23 NOTE — Telephone Encounter (Signed)
I can't tell if I have refilled this before. If so I am happy to refill. Otherwise it should go to primary care   Gurshan Settlemire Martinique MD, [facility]

## 2020-07-24 ENCOUNTER — Other Ambulatory Visit: Payer: Self-pay

## 2020-07-24 ENCOUNTER — Other Ambulatory Visit: Payer: Self-pay | Admitting: Cardiology

## 2020-07-24 DIAGNOSIS — M4316 Spondylolisthesis, lumbar region: Secondary | ICD-10-CM | POA: Diagnosis not present

## 2020-07-24 DIAGNOSIS — F419 Anxiety disorder, unspecified: Secondary | ICD-10-CM

## 2020-07-24 MED ORDER — ALPRAZOLAM 0.25 MG PO TABS: 0.2500 mg | ORAL_TABLET | Freq: Every evening | ORAL | 3 refills | Status: DC | PRN

## 2020-07-24 NOTE — Telephone Encounter (Signed)
Follow up   Pt is calling to follow up refill, she said she is completely out and need it today.

## 2020-07-24 NOTE — Telephone Encounter (Signed)
Refill sent to requested pharmacy.

## 2020-07-25 ENCOUNTER — Other Ambulatory Visit: Payer: Self-pay | Admitting: Cardiology

## 2020-07-25 DIAGNOSIS — F419 Anxiety disorder, unspecified: Secondary | ICD-10-CM

## 2020-07-25 NOTE — Telephone Encounter (Signed)
Follow up:      Patient calling to check the status of her refill. Patient states the doctor has been refill this medication for years. Please call patient back.

## 2020-07-25 NOTE — Telephone Encounter (Signed)
Pt unavailable Will call back ./cy

## 2020-07-28 NOTE — Telephone Encounter (Signed)
Per pharmacy has been phoned in ./cy

## 2020-07-28 NOTE — Telephone Encounter (Signed)
Called patient to find out what medication she is needing refilled. Megan [name] stated that she needs her Alprazolam. I informed patient that I will send the request to Dr. Martinique. Patient stated that she is a former patient of Dr. Mare Ferrari and that Dr. Martinique took over her care when he retired and that he has not had a problem with refilling it 90 day supply with 3 refills. I re-informed her that I will send to Dr. Martinique and his nurse for review she thanked me for returning her call and will wait to hear back from me or someone from our office.

## 2020-07-28 NOTE — Telephone Encounter (Signed)
Follow up   Pti is calling to follow up, she said she would like to speak with Malachy Mood

## 2020-07-28 NOTE — Telephone Encounter (Signed)
OK to refill Xanax  [name] MD, [facility]

## 2020-08-04 DIAGNOSIS — H04121 Dry eye syndrome of right lacrimal gland: Secondary | ICD-10-CM | POA: Diagnosis not present

## 2020-08-04 DIAGNOSIS — H04122 Dry eye syndrome of left lacrimal gland: Secondary | ICD-10-CM | POA: Diagnosis not present

## 2020-08-07 DIAGNOSIS — M4316 Spondylolisthesis, lumbar region: Secondary | ICD-10-CM | POA: Diagnosis not present

## 2020-08-14 DIAGNOSIS — M4316 Spondylolisthesis, lumbar region: Secondary | ICD-10-CM | POA: Diagnosis not present

## 2020-08-17 ENCOUNTER — Other Ambulatory Visit: Payer: Self-pay | Admitting: Cardiology

## 2020-08-21 ENCOUNTER — Ambulatory Visit: Payer: Medicare Other | Attending: Student

## 2020-08-21 ENCOUNTER — Other Ambulatory Visit: Payer: Self-pay

## 2020-08-21 DIAGNOSIS — G8929 Other chronic pain: Secondary | ICD-10-CM | POA: Diagnosis not present

## 2020-08-21 DIAGNOSIS — R2681 Unsteadiness on feet: Secondary | ICD-10-CM | POA: Insufficient documentation

## 2020-08-21 DIAGNOSIS — R2689 Other abnormalities of gait and mobility: Secondary | ICD-10-CM | POA: Insufficient documentation

## 2020-08-21 DIAGNOSIS — M545 Low back pain, unspecified: Secondary | ICD-10-CM

## 2020-08-21 NOTE — Addendum Note (Signed)
Addended by: Kerrie Pleasure on: 08/21/2020 09:43 PM   Modules accepted: Orders

## 2020-08-21 NOTE — Therapy (Signed)
[facility] [address] [place], [place], [address] Phone: [phone]   Fax:  [phone]  Physical Therapy Evaluation  Patient Details  Name: Megan [name] MRN: [id number] Date of Birth: 05-05-1946 Referring Provider (PT): Glenford Peers, NP   Encounter Date: 08/21/2020   PT End of Session - 08/21/20 2140    Visit Number 1    Number of Visits 17    Date for PT Re-Evaluation 10/16/20    Authorization Type Medicare/Medicaid    Progress Note Due on Visit 10    PT Start Time 1700    PT Stop Time 1745    PT Time Calculation (min) 45 min    Activity Tolerance Patient tolerated treatment well    Behavior During Therapy [facility] for tasks assessed/performed           Past Medical History:  Diagnosis Date  . AAA (abdominal aortic aneurysm) ([place])    a. s/p stent grafting in 2009.  . Adenomatous colon polyp 05/2001  . Adenomatous duodenal polyp   . Allergy   . Anginal pain ([place])   . COPD (chronic obstructive pulmonary disease) ([place])    pt denies COPD  . Coronary artery disease    a. s/p CABG in 1995;  b. 05/2013 Neg MV, EF 82%;  c. 06/2013 Cath: LM nl, LAD 40-50p, LCX nl, RCA 80p/127m, VG->RCA->PDA->PLSA min irregs, LIMA->LAD atretic, vigorous LV fxn.  . Diabetes mellitus without complication (HCC)    history of, resolved. 2017  . Diverticulosis   . Eczema, dyshidrotic   . Eczema, dyshidrotic 1986   Dr. Mathis Fare  . Emphysema of lung ([place])   . Fatty liver   . Fibromyalgia   . GERD (gastroesophageal reflux disease)   . Hyperlipidemia   . Hypertension   . Migraine   . Myocardial infarction ([place])   . Osteoarthritis   . Osteoporosis    unsure, having a bone scan soon  . Peripheral arterial disease (HCC)    left leg diagnosed by Dr Mare Ferrari  . Renal artery stenosis ([place])    a. 2008 s/p PTA    Past Surgical History:  Procedure Laterality Date  . ABDOMINAL AORTAGRAM N/A 03/06/2014   Procedure: ABDOMINAL  AORTAGRAM;  Surgeon: Rosetta Posner, MD;  Location: [facility] CATH LAB;  Service: Cardiovascular;  Laterality: N/A;  . ABDOMINAL AORTIC ANEURYSM REPAIR  2009   stenting  . ABDOMINAL HYSTERECTOMY     Total, 1992  . BREAST BIOPSY    . CARDIAC CATHETERIZATION    . CARDIOVASCULAR STRESS TEST  11/11/2009   normal study  . CARPAL TUNNEL RELEASE  08/1993   left wrist  . CARPAL TUNNEL RELEASE  01/1997   right  . CATARACT EXTRACTION, BILATERAL  2021  . CORONARY ARTERY BYPASS GRAFT  07/1994   Triple by Dr Ceasar Mons  . dental implants    . EYE SURGERY  03/2020   cataract  . HAND SURGERY Right 09/2002   Right hand pulley release  . LEFT HEART CATHETERIZATION WITH CORONARY/GRAFT ANGIOGRAM N/A 06/11/2013   Procedure: LEFT HEART CATHETERIZATION WITH [name];  Surgeon: Thayer Headings, MD;  Location: [facility] CATH LAB;  Service: Cardiovascular;  Laterality: N/A;  . MULTIPLE TOOTH EXTRACTIONS  2000   All upper teeth.  Has full dneture.   [name] RENAL ARTERY STENT  2008  . SHOULDER ARTHROSCOPY WITH ROTATOR CUFF REPAIR Right 09/27/2018   Procedure: Right shoulder mini open rotator cuff repair;  Surgeon: Susa Day,  MD;  Location: [facility] ORS;  Service: Orthopedics;  Laterality: Right;  90 mins  . thyroid cyst apiration  05/1997 and 07/1998  . TONSILECTOMY, ADENOIDECTOMY, BILATERAL MYRINGOTOMY AND TUBES  1989  . TONSILLECTOMY    . TOTAL ABDOMINAL HYSTERECTOMY  1992    There were no vitals filed for this visit.    Subjective Assessment - 08/21/20 2113    Subjective Had back surgery of L3-5 fusion 9 weeks ago. Patient is reporting increasing balance issues. Pt has also fallen twice in last 2 weeks. Pt is getting MRI done soon. Pt is reporting past 3 weeks, she has noticed increased gait difficulties.    Pertinent History AAA, angina, COPD, CAD s/p CABG, DM, diverticulosis, fibromyalgia, HTN, migraine, MI, OA, PAD, Renal artery stenosis. back pain    Limitations Standing;Lifting;Walking;House hold activities     How long can you sit comfortably? no limits    How long can you stand comfortably? 5-10 min    How long can you walk comfortably? 5-10 min    Currently in Pain? Yes    Pain Score 7     Pain Location Back    Pain Orientation Right;Left    Pain Descriptors / Indicators Aching;Tightness;Tiring;Discomfort;Spasm    Pain Type Chronic pain;Surgical pain    Pain Onset More than a month ago    Pain Frequency Constant    Aggravating Factors  standing, walking, bending, lifting    Pain Relieving Factors position changes              [facility] PT Assessment - 08/21/20 2115      Assessment   Medical Diagnosis S/P L3-5 fusion, gait and balance disorder    Referring Provider (PT) Glenford Peers, NP    Onset Date/Surgical Date 06/19/20      Precautions   Precautions Back      Restrictions   Weight Bearing Restrictions No      Balance Screen   Has the patient fallen in the past 6 months Yes    How many times? 2    Has the patient had a decrease in activity level because of a fear of falling?  No    Is the patient reluctant to leave their home because of a fear of falling?  No      Home Environment   Living Environment Private residence    Type of [place] Two level;Able to live on main level with bedroom/bathroom    Alternate Level Stairs-Number of Steps 12      Prior Function   Level of Independence Independent      Cognition   Overall Cognitive Status Within Functional Limits for tasks assessed      ROM / Strength   AROM / PROM / Strength Strength      Strength   Strength Assessment Site Hip;Knee;Ankle    Right/Left Hip Right;Left    Right Hip Flexion 4/5    Right Hip ABduction 4/5    Left Hip Flexion 4+/5    Left Hip ABduction 4/5    Left Hip ADduction 4/5    Right/Left Knee Right;Left    Right Knee Flexion 4/5    Right Knee Extension 4+/5    Left Knee Flexion 4+/5    Left Knee Extension 5/5    Right/Left Ankle Right;Left    Right Ankle  Dorsiflexion 5/5    Left Ankle Dorsiflexion 5/5      Balance   Balance Assessed Yes  Standardized Balance Assessment   Standardized Balance Assessment Five Times Sit to Stand;Dynamic Gait Index    Five times sit to stand comments  25 seconds      Dynamic Gait Index   Level Surface Normal    Change in Gait Speed Mild Impairment    Gait with Horizontal Head Turns Moderate Impairment    Gait with Vertical Head Turns Mild Impairment    Gait and Pivot Turn Moderate Impairment    Step Over Obstacle Mild Impairment    Step Around Obstacles Normal    Steps Mild Impairment    Total Score 16    DGI comment: 16/24                      Objective measurements completed on examination: See above findings.                 PT Short Term Goals - 08/21/20 2124      PT SHORT TERM GOAL #1   Title Patient will be compliant with walking 10 min for 4x/week to initiate walking program    Baseline unable to walk 1/4 mile    Time 4    Period Weeks    Status New    Target Date 09/18/20      PT SHORT TERM GOAL #2   Title Patient will report <5/10 pain in her back with ADLs to improve function at home    Baseline 7/10 constant (eval)    Time 4    Period Weeks    Status New    Target Date 09/18/20             PT Long Term Goals - 08/21/20 2133      PT LONG TERM GOAL #1   Title Patient will demo 22/24 on DGI to improve balance with ambulation    Baseline 16/24 (eval)    Time 8    Period Weeks    Status New    Target Date 10/16/20      PT LONG TERM GOAL #2   Title Patient will demo 5x sit to stand without HHA under 17 seconds to improve functional strength    Baseline 25 seconds no HHA    Time 8    Period Weeks    Status New    Target Date 10/16/20      PT LONG TERM GOAL #3   Title Patient will report <3/10 pain in her loewr back with functional activities to improve overall function    Baseline 7/10 constant pain (eval)    Time 8    Period Weeks     Status New    Target Date 10/16/20                  Plan - 08/21/20 2135    Clinical Impression Statement Patient is a 74 y.o. female who was seen today for physical therapy evaluation and treatment for lower back pain after L3-5 lumbar fusion (9 wks ago) and gait and balance disorder. Patient has reported 2 falls in last 3 weeks due to gait and balance difficulties. Patient is curretly ambulating with st. cane due to unsteadiness on feet. Patient demonstrates decreased overall functional strength (MMT and 5x sit to stand test) and decreased functional balance with ambulation (Dynamic gait index). Patient will benefit from skilled PT to address strength, gait and balance and improve pain to improve overall function and reduce fall risk    Personal Factors and  Comorbidities Comorbidity 3+    Comorbidities AAA, angina, COPD, CAD s/p CABG, DM, diverticulosis, fibromyalgia, HTN, migraine, MI, OA, PAD, Renal artery stenosis. back pain    Examination-Activity Limitations Bathing;Bend;Caring for Others;Carry;Lift;Stairs;Stand;Squat;Sleep;Transfers    Examination-Participation Restrictions Cleaning;Community Activity;Driving;Laundry;Yard Work;Shop;Meal Prep    Stability/Clinical Decision Making Evolving/Moderate complexity    Clinical Decision Making Moderate    Rehab Potential Good    PT Frequency 2x / week    PT Duration 8 weeks    PT Treatment/Interventions ADLs/Self Care Home Management;Moist Heat;Neuromuscular re-education;Balance training;Therapeutic exercise;Therapeutic activities;Functional mobility training;Stair training;Gait training;Patient/family education;Manual techniques;Scar mobilization;Passive range of motion;Energy conservation;Joint Manipulations;Spinal Manipulations;Vestibular    PT Next Visit Plan Issue HEP, assess incision in Low back    PT Home Exercise Plan TBD    Consulted and Agree with Plan of Care Patient           Patient will benefit from skilled  therapeutic intervention in order to improve the following deficits and impairments:  Abnormal gait, Decreased activity tolerance, Decreased balance, Decreased mobility, Decreased endurance, Decreased range of motion, Decreased strength, Hypomobility, Dizziness, Difficulty walking, Impaired flexibility, Postural dysfunction, Pain  Visit Diagnosis: Chronic bilateral low back pain without sciatica  Other abnormalities of gait and mobility  Unsteadiness on feet     Problem List Patient Active Problem List   Diagnosis Date Noted  . S/P lumbar fusion 06/18/2020  . COPD with chronic bronchitis ([place]) 09/28/2018  . Essential hypertension 07/10/2018  . IFG (impaired fasting glucose) 11/10/2017  . DJD (degenerative joint disease), lumbosacral 11/10/2017  . Fibromyalgia 11/10/2017  . Dyshidrotic eczema 11/10/2017  . GERD (gastroesophageal reflux disease) 11/10/2017  . Osteopenia 11/10/2017  . Does use hearing aid 11/10/2017  . 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  . Fatty liver 11/10/2017  . Coronary artery disease   . Peripheral vascular disease with claudication ([place])   . 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  . History of abdominal aortic aneurysm repair 05/18/2011  . History of renal artery stenosis 05/18/2011  . Mixed hyperlipidemia     Kerrie Pleasure, PT 08/21/2020, 9:41 PM  [facility] [address] [place], [place], [address] Phone: [phone]   Fax:  [phone]  Name: Megan [name] MRN: [id number] Date of Birth: 11/28/1946

## 2020-08-25 DIAGNOSIS — H903 Sensorineural hearing loss, bilateral: Secondary | ICD-10-CM | POA: Diagnosis not present

## 2020-08-25 DIAGNOSIS — H9193 Unspecified hearing loss, bilateral: Secondary | ICD-10-CM | POA: Diagnosis not present

## 2020-08-29 ENCOUNTER — Ambulatory Visit: Payer: Medicare Other

## 2020-09-01 DIAGNOSIS — H04121 Dry eye syndrome of right lacrimal gland: Secondary | ICD-10-CM | POA: Diagnosis not present

## 2020-09-02 ENCOUNTER — Ambulatory Visit: Payer: Medicare Other

## 2020-09-02 ENCOUNTER — Other Ambulatory Visit: Payer: Self-pay

## 2020-09-02 DIAGNOSIS — M545 Low back pain, unspecified: Secondary | ICD-10-CM

## 2020-09-02 DIAGNOSIS — G8929 Other chronic pain: Secondary | ICD-10-CM

## 2020-09-02 DIAGNOSIS — R2689 Other abnormalities of gait and mobility: Secondary | ICD-10-CM | POA: Diagnosis not present

## 2020-09-02 DIAGNOSIS — R2681 Unsteadiness on feet: Secondary | ICD-10-CM

## 2020-09-02 NOTE — Therapy (Signed)
[facility] [address] [place], [place], [address] Phone: [phone]   Fax:  [phone]  Physical Therapy Treatment  Patient Details  Name: Megan [name] MRN: [id number] Date of Birth: 1946/08/05 Referring Provider (PT): Glenford Peers, NP   Encounter Date: 09/02/2020   PT End of Session - 09/02/20 1913    Visit Number 2    Number of Visits 17    Date for PT Re-Evaluation 10/16/20    Authorization Type Medicare/Medicaid    Progress Note Due on Visit 10    PT Start Time 2778    PT Stop Time 1530    PT Time Calculation (min) 45 min    Activity Tolerance Patient tolerated treatment well    Behavior During Therapy [facility] for tasks assessed/performed           Past Medical History:  Diagnosis Date  . AAA (abdominal aortic aneurysm) ([place])    a. s/p stent grafting in 2009.  . Adenomatous colon polyp 05/2001  . Adenomatous duodenal polyp   . Allergy   . Anginal pain ([place])   . COPD (chronic obstructive pulmonary disease) ([place])    pt denies COPD  . Coronary artery disease    a. s/p CABG in 1995;  b. 05/2013 Neg MV, EF 82%;  c. 06/2013 Cath: LM nl, LAD 40-50p, LCX nl, RCA 80p/170m, VG->RCA->PDA->PLSA min irregs, LIMA->LAD atretic, vigorous LV fxn.  . Diabetes mellitus without complication (HCC)    history of, resolved. 2017  . Diverticulosis   . Eczema, dyshidrotic   . Eczema, dyshidrotic 1986   Dr. Mathis Fare  . Emphysema of lung ([place])   . Fatty liver   . Fibromyalgia   . GERD (gastroesophageal reflux disease)   . Hyperlipidemia   . Hypertension   . Migraine   . Myocardial infarction ([place])   . Osteoarthritis   . Osteoporosis    unsure, having a bone scan soon  . Peripheral arterial disease (HCC)    left leg diagnosed by Dr Mare Ferrari  . Renal artery stenosis ([place])    a. 2008 s/p PTA    Past Surgical History:  Procedure Laterality Date  . ABDOMINAL AORTAGRAM N/A 03/06/2014   Procedure: ABDOMINAL  AORTAGRAM;  Surgeon: Rosetta Posner, MD;  Location[facility] C[facility] LAB;  Service: Cardiovascular;  Laterality: N/A;  . ABDOMINAL AORTIC ANEURYSM REPAIR  2009   stenting  . ABDOMINAL HYSTERECTOMY     Total, 1992  . BREAST BIOPSY    . CARDIAC CATHETERIZATION    . CARDIOVASCULAR STRESS TEST  11/11/2009   normal study  . CARPAL TUNNEL RELEASE  08/1993   left wrist  . CARPAL TUNNEL RELEASE  01/1997   right  . CATARACT EXTRACTION, BILATERAL  2021  . CORONARY ARTERY BYPASS GRAFT  07/1994   Triple by Dr Ceasar Mons  . dental implants    . EYE SURGERY  03/2020   cataract  . HAND SURGERY Right 09/2002   Right hand pulley release  . LEFT HEART CATHETERIZATION WITH CORONARY/GRAFT ANGIOGRAM N/A 06/11/2013   Procedure: LEFT HEART CATHETERIZATION WITH [name];  Surgeon: Thayer Headings, MD;  Location: [facility] CATH LAB;  Service: Cardiovascular;  Laterality: N/A;  . MULTIPLE TOOTH EXTRACTIONS  2000   All upper teeth.  Has full dneture.   [name] RENAL ARTERY STENT  2008  . SHOULDER ARTHROSCOPY WITH ROTATOR CUFF REPAIR Right 09/27/2018   Procedure: Right shoulder mini open rotator cuff repair;  Surgeon: Susa Day,  MD;  Location: [facility] ORS;  Service: Orthopedics;  Laterality: Right;  90 mins  . thyroid cyst apiration  05/1997 and 07/1998  . TONSILECTOMY, ADENOIDECTOMY, BILATERAL MYRINGOTOMY AND TUBES  1989  . TONSILLECTOMY    . TOTAL ABDOMINAL HYSTERECTOMY  1992    There were no vitals filed for this visit.   Subjective Assessment - 09/02/20 1455    Subjective Pain is 7-8/10 in the morning before oxycodone. After taking meds pain is about 4/10 and then pain starts to go up around 2 pm. She takes another pain med in the evening.    Pertinent History AAA, angina, COPD, CAD s/p CABG, DM, diverticulosis, fibromyalgia, HTN, migraine, MI, OA, PAD, Renal artery stenosis. back pain    Limitations Standing;Lifting;Walking;House hold activities    How long can you sit comfortably? no limits    How long can you  stand comfortably? 5-10 min    How long can you walk comfortably? 5-10 min    Pain Onset More than a month ago                With 2 pillows under belly and blue bolster under lower LE: pt lying in prone for manual therapy myofascial release around lateral sacral borders, sacral sulcus, around incision STM to bil gluts (L>R)  Practiced anterior pelvis tilts in supine hooklying. Pt cued to maintain anterior pelvis tilt with following exercises. Following exercises given as HEP - knee to chest: 10 x 10" R and L: - Supine 90/90 hamstring stretch: 10 x 10" R and L - Supine figure 4 hip external rotation stretch: 10 x 10" R and L                       PT Short Term Goals - 08/21/20 2124      PT SHORT TERM GOAL #1   Title Patient will be compliant with walking 10 min for 4x/week to initiate walking program    Baseline unable to walk 1/4 mile    Time 4    Period Weeks    Status New    Target Date 09/18/20      PT SHORT TERM GOAL #2   Title Patient will report <5/10 pain in her back with ADLs to improve function at home    Baseline 7/10 constant (eval)    Time 4    Period Weeks    Status New    Target Date 09/18/20             PT Long Term Goals - 08/21/20 2133      PT LONG TERM GOAL #1   Title Patient will demo 22/24 on DGI to improve balance with ambulation    Baseline 16/24 (eval)    Time 8    Period Weeks    Status New    Target Date 10/16/20      PT LONG TERM GOAL #2   Title Patient will demo 5x sit to stand without HHA under 17 seconds to improve functional strength    Baseline 25 seconds no HHA    Time 8    Period Weeks    Status New    Target Date 10/16/20      PT LONG TERM GOAL #3   Title Patient will report <3/10 pain in her loewr back with functional activities to improve overall function    Baseline 7/10 constant pain (eval)    Time 8    Period Weeks  Status New    Target Date 10/16/20                 Plan -  09/02/20 1535    Clinical Impression Statement Today's session was focused issue HEP and reviwing it patient. Pt reported improved flexibility with manual therapy and exercsies at end of the session.    Personal Factors and Comorbidities Comorbidity 3+    Comorbidities AAA, angina, COPD, CAD s/p CABG, DM, diverticulosis, fibromyalgia, HTN, migraine, MI, OA, PAD, Renal artery stenosis. back pain    Examination-Activity Limitations Bathing;Bend;Caring for Others;Carry;Lift;Stairs;Stand;Squat;Sleep;Transfers    Examination-Participation Restrictions Cleaning;Community Activity;Driving;Laundry;Yard Work;Shop;Meal Prep    Stability/Clinical Decision Making Evolving/Moderate complexity    Rehab Potential Good    PT Frequency 2x / week    PT Duration 8 weeks    PT Treatment/Interventions ADLs/Self Care Home Management;Moist Heat;Neuromuscular re-education;Balance training;Therapeutic exercise;Therapeutic activities;Functional mobility training;Stair training;Gait training;Patient/family education;Manual techniques;Scar mobilization;Passive range of motion;Energy conservation;Joint Manipulations;Spinal Manipulations;Vestibular    PT Next Visit Plan Issue HEP, assess incision in Low back    PT Home Exercise Plan Access Code: [email or web address]: [email or web address]: 09/28/2021Prepared by: Gwenyth Bouillon PatelExercisesHooklying Single Knee to Chest Stretch - 2 x daily - 7 x weekly - 10 reps - 10 holdHooklying Hamstring Stretch - 2 x daily - 7 x weekly - 10 reps - 10 holdSupine Hip External Rotation Stretch - 2 x daily - 7 x weekly - 10 reps - 10 hold    Consulted and Agree with Plan of Care Patient           Patient will benefit from skilled therapeutic intervention in order to improve the following deficits and impairments:  Abnormal gait, Decreased activity tolerance, Decreased balance, Decreased mobility, Decreased endurance, Decreased range of motion, Decreased strength, Hypomobility, Dizziness,  Difficulty walking, Impaired flexibility, Postural dysfunction, Pain  Visit Diagnosis: Chronic bilateral low back pain without sciatica  Other abnormalities of gait and mobility  Unsteadiness on feet     Problem List Patient Active Problem List   Diagnosis Date Noted  . S/P lumbar fusion 06/18/2020  . COPD with chronic bronchitis ([place]) 09/28/2018  . Essential hypertension 07/10/2018  . IFG (impaired fasting glucose) 11/10/2017  . DJD (degenerative joint disease), lumbosacral 11/10/2017  . Fibromyalgia 11/10/2017  . Dyshidrotic eczema 11/10/2017  . GERD (gastroesophageal reflux disease) 11/10/2017  . Osteopenia 11/10/2017  . Does use hearing aid 11/10/2017  . 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  . Fatty liver 11/10/2017  . Coronary artery disease   . Peripheral vascular disease with claudication ([place])   . 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  . History of abdominal aortic aneurysm repair 05/18/2011  . History of renal artery stenosis 05/18/2011  . Mixed hyperlipidemia     Kerrie Pleasure 09/02/2020, 7:14 PM  [facility] [address] [place], [place], [address] Phone: [phone]   Fax:  [phone]  Name: Namine Beahm MRN: [id number] Date of Birth: 09-Jul-1946

## 2020-09-04 DIAGNOSIS — R29898 Other symptoms and signs involving the musculoskeletal system: Secondary | ICD-10-CM | POA: Diagnosis not present

## 2020-09-04 DIAGNOSIS — M48061 Spinal stenosis, lumbar region without neurogenic claudication: Secondary | ICD-10-CM | POA: Diagnosis not present

## 2020-09-09 ENCOUNTER — Ambulatory Visit: Payer: Medicare Other | Attending: Student | Admitting: Physical Therapy

## 2020-09-09 ENCOUNTER — Other Ambulatory Visit: Payer: Self-pay | Admitting: Neurological Surgery

## 2020-09-09 ENCOUNTER — Other Ambulatory Visit: Payer: Self-pay

## 2020-09-09 ENCOUNTER — Encounter: Payer: Self-pay | Admitting: Physical Therapy

## 2020-09-09 DIAGNOSIS — M6281 Muscle weakness (generalized): Secondary | ICD-10-CM | POA: Insufficient documentation

## 2020-09-09 DIAGNOSIS — I69351 Hemiplegia and hemiparesis following cerebral infarction affecting right dominant side: Secondary | ICD-10-CM | POA: Diagnosis not present

## 2020-09-09 DIAGNOSIS — R2681 Unsteadiness on feet: Secondary | ICD-10-CM

## 2020-09-09 DIAGNOSIS — M545 Low back pain, unspecified: Secondary | ICD-10-CM | POA: Diagnosis not present

## 2020-09-09 DIAGNOSIS — R2689 Other abnormalities of gait and mobility: Secondary | ICD-10-CM | POA: Diagnosis not present

## 2020-09-09 DIAGNOSIS — G8929 Other chronic pain: Secondary | ICD-10-CM

## 2020-09-09 NOTE — Therapy (Signed)
[facility] [address] [place], [place], [address] Phone: [phone]   Fax:  [phone]  Physical Therapy Treatment  Patient Details  Name: Megan [name] MRN: [id number] Date of Birth: 04/30/1946 Referring Provider (PT): Glenford Peers, NP   Encounter Date: 09/09/2020   PT End of Session - 09/09/20 1405    Visit Number 3    Number of Visits 17    Date for PT Re-Evaluation 10/16/20    Authorization Type Medicare/Medicaid    Progress Note Due on Visit 10    PT Start Time 1406    PT Stop Time 1450    PT Time Calculation (min) 44 min    Activity Tolerance Patient tolerated treatment well    Behavior During Therapy [facility] for tasks assessed/performed           Past Medical History:  Diagnosis Date  . AAA (abdominal aortic aneurysm) ([place])    a. s/p stent grafting in 2009.  . Adenomatous colon polyp 05/2001  . Adenomatous duodenal polyp   . Allergy   . Anginal pain ([place])   . COPD (chronic obstructive pulmonary disease) ([place])    pt denies COPD  . Coronary artery disease    a. s/p CABG in 1995;  b. 05/2013 Neg MV, EF 82%;  c. 06/2013 Cath: LM nl, LAD 40-50p, LCX nl, RCA 80p/185m, VG->RCA->PDA->PLSA min irregs, LIMA->LAD atretic, vigorous LV fxn.  . Diabetes mellitus without complication (HCC)    history of, resolved. 2017  . Diverticulosis   . Eczema, dyshidrotic   . Eczema, dyshidrotic 1986   Dr. Mathis Fare  . Emphysema of lung ([place])   . Fatty liver   . Fibromyalgia   . GERD (gastroesophageal reflux disease)   . Hyperlipidemia   . Hypertension   . Migraine   . Myocardial infarction ([place])   . Osteoarthritis   . Osteoporosis    unsure, having a bone scan soon  . Peripheral arterial disease (HCC)    left leg diagnosed by Dr Mare Ferrari  . Renal artery stenosis ([place])    a. 2008 s/p PTA    Past Surgical History:  Procedure Laterality Date  . ABDOMINAL AORTAGRAM N/A 03/06/2014   Procedure: ABDOMINAL  AORTAGRAM;  Surgeon: Rosetta Posner, MD;  Location: [facility] CATH LAB;  Service: Cardiovascular;  Laterality: N/A;  . ABDOMINAL AORTIC ANEURYSM REPAIR  2009   stenting  . ABDOMINAL HYSTERECTOMY     Total, 1992  . BREAST BIOPSY    . CARDIAC CATHETERIZATION    . CARDIOVASCULAR STRESS TEST  11/11/2009   normal study  . CARPAL TUNNEL RELEASE  08/1993   left wrist  . CARPAL TUNNEL RELEASE  01/1997   right  . CATARACT EXTRACTION, BILATERAL  2021  . CORONARY ARTERY BYPASS GRAFT  07/1994   Triple by Dr Ceasar Mons  . dental implants    . EYE SURGERY  03/2020   cataract  . HAND SURGERY Right 09/2002   Right hand pulley release  . LEFT HEART CATHETERIZATION WITH CORONARY/GRAFT ANGIOGRAM N/A 06/11/2013   Procedure: LEFT HEART CATHETERIZATION WITH [name];  Surgeon: Thayer Headings, MD;  Location: [facility] CATH LAB;  Service: Cardiovascular;  Laterality: N/A;  . MULTIPLE TOOTH EXTRACTIONS  2000   All upper teeth.  Has full dneture.   [name] RENAL ARTERY STENT  2008  . SHOULDER ARTHROSCOPY WITH ROTATOR CUFF REPAIR Right 09/27/2018   Procedure: Right shoulder mini open rotator cuff repair;  Surgeon: Susa Day,  MD;  Location: [facility] ORS;  Service: Orthopedics;  Laterality: Right;  90 mins  . thyroid cyst apiration  05/1997 and 07/1998  . TONSILECTOMY, ADENOIDECTOMY, BILATERAL MYRINGOTOMY AND TUBES  1989  . TONSILLECTOMY    . TOTAL ABDOMINAL HYSTERECTOMY  1992    There were no vitals filed for this visit.   Subjective Assessment - 09/09/20 1411    Subjective Pt states she had a compression fracture in L3 on her MRI. MRI was performed last week and results were read today. Pt is to wear her brace again x 4 weeks. She did not present to PT with her brace on today. Pt reports she had increased pain after last session along her hips and sacral area; contributes this likely to her compression fracture.    Pertinent History AAA, angina, COPD, CAD s/p CABG, DM, diverticulosis, fibromyalgia, HTN, migraine, MI,  OA, PAD, Renal artery stenosis. back pain    Limitations Standing;Lifting;Walking;House hold activities    How long can you sit comfortably? no limits    How long can you stand comfortably? 5-10 min    How long can you walk comfortably? 5-10 min    Diagnostic tests MRI: L3 compression fracture per pt report    Currently in Pain? Yes    Pain Score 7     Pain Location Back    Pain Orientation Right;Left;Lower    Pain Onset More than a month ago                             [facility] Adult PT Treatment/Exercise - 09/09/20 0001      Lumbar Exercises: Supine   Ab Set 5 reps;5 seconds    Bent Knee Raise 20 reps    Bridge 20 reps    Bridge Limitations to the point of discomfort      Lumbar Exercises: Sidelying   Clam 20 reps;Both      Lumbar Exercises: Prone   Other Prone Lumbar Exercises prone knee bend hip extension 2x10 bilat   Cues to reduce twisting in lumbar spine; min A for L LE     Manual Therapy   Manual Therapy Joint mobilization;Soft tissue mobilization    Joint Mobilization grade II to III anterior hip mobilization    Soft tissue mobilization bilat piriformis, glute, and lower quadratus lumborum                  PT Education - 09/09/20 1606    Education Details Discussed alternative sleep positions for pt in prone, supine, and side lying. Discussed compression fracture and maintaining spinal precautions    Person(s) Educated Patient    Methods Explanation;Demonstration;Verbal cues;Handout;Tactile cues    Comprehension Verbalized understanding;Returned demonstration;Verbal cues required;Tactile cues required            PT Short Term Goals - 08/21/20 2124      PT SHORT TERM GOAL #1   Title Patient will be compliant with walking 10 min for 4x/week to initiate walking program    Baseline unable to walk 1/4 mile    Time 4    Period Weeks    Status New    Target Date 09/18/20      PT SHORT TERM GOAL #2   Title Patient will report <5/10 pain  in her back with ADLs to improve function at home    Baseline 7/10 constant (eval)    Time 4    Period Weeks  Status New    Target Date 09/18/20             PT Long Term Goals - 08/21/20 2133      PT LONG TERM GOAL #1   Title Patient will demo 22/24 on DGI to improve balance with ambulation    Baseline 16/24 (eval)    Time 8    Period Weeks    Status New    Target Date 10/16/20      PT LONG TERM GOAL #2   Title Patient will demo 5x sit to stand without HHA under 17 seconds to improve functional strength    Baseline 25 seconds no HHA    Time 8    Period Weeks    Status New    Target Date 10/16/20      PT LONG TERM GOAL #3   Title Patient will report <3/10 pain in her loewr back with functional activities to improve overall function    Baseline 7/10 constant pain (eval)    Time 8    Period Weeks    Status New    Target Date 10/16/20                 Plan - 09/09/20 1607    Clinical Impression Statement Incision appears well with mild erythema but no overt drainage. Modified pt's HEP with more stabilization exercises due to pt with increased pain performing stretching. Educated pt on maintaining spinal precautions and safe sleeping positions due to her compression fracture. Treatment focused on increasing core and hip strength/stabilization. Pt found to have L>R LE weakness -- especially with extension. Pt with decreased hip extension mobility L worse than R and provided gentle mobs/stretching accordingly. Pt reports increased pain with pelvic tilting.    Personal Factors and Comorbidities Comorbidity 3+    Comorbidities AAA, angina, COPD, CAD s/p CABG, DM, diverticulosis, fibromyalgia, HTN, migraine, MI, OA, PAD, Renal artery stenosis. back pain    Examination-Activity Limitations Bathing;Bend;Caring for Others;Carry;Lift;Stairs;Stand;Squat;Sleep;Transfers    Examination-Participation Restrictions Cleaning;Community Activity;Driving;Laundry;Yard Work;Shop;Meal Prep     Stability/Clinical Decision Making Evolving/Moderate complexity    Rehab Potential Good    PT Frequency 2x / week    PT Duration 8 weeks    PT Treatment/Interventions ADLs/Self Care Home Management;Moist Heat;Neuromuscular re-education;Balance training;Therapeutic exercise;Therapeutic activities;Functional mobility training;Stair training;Gait training;Patient/family education;Manual techniques;Scar mobilization;Passive range of motion;Energy conservation;Joint Manipulations;Spinal Manipulations;Vestibular    PT Next Visit Plan Assess response to HEP. Continue to perform core and hip strengthening as pt is able to tolerate. Manual therapy if indicated.    PT Home Exercise Plan Access Code [email or web address]    Consulted and Agree with Plan of Care Patient           Patient will benefit from skilled therapeutic intervention in order to improve the following deficits and impairments:  Abnormal gait, Decreased activity tolerance, Decreased balance, Decreased mobility, Decreased endurance, Decreased range of motion, Decreased strength, Hypomobility, Dizziness, Difficulty walking, Impaired flexibility, Postural dysfunction, Pain  Visit Diagnosis: Chronic bilateral low back pain without sciatica  Other abnormalities of gait and mobility  Unsteadiness on feet     Problem List Patient Active Problem List   Diagnosis Date Noted  . S/P lumbar fusion 06/18/2020  . COPD with chronic bronchitis ([place]) 09/28/2018  . Essential hypertension 07/10/2018  . IFG (impaired fasting glucose) 11/10/2017  . DJD (degenerative joint disease), lumbosacral 11/10/2017  . Fibromyalgia 11/10/2017  . Dyshidrotic eczema 11/10/2017  . GERD (gastroesophageal reflux disease) 11/10/2017  . Osteopenia 11/10/2017  .  Does use hearing aid 11/10/2017  . 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  . Fatty liver 11/10/2017  . Coronary artery disease   . Peripheral vascular disease with claudication ([place])   . 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    . History of abdominal aortic aneurysm repair 05/18/2011  . History of renal artery stenosis 05/18/2011  . Mixed hyperlipidemia     Megan [name] April Ma L Zaray Gatchel PT, DPT 09/09/2020, 4:13 PM  [facility] [address] [place], [place], [address] Phone: [phone]   Fax:  [phone]  Name: Megan [name] MRN: [id number] Date of Birth: [birth year]

## 2020-09-09 NOTE — Patient Instructions (Signed)
Access Code: [email or web address] URL: [email or web address] Date: 09/09/2020 Prepared by: Estill Bamberg April Thurnell Garbe  Exercises Supine Bridge - 1 x daily - 7 x weekly - 2 sets - 10 reps Clamshell - 1 x daily - 7 x weekly - 2 sets - 10 reps Prone Hip Extension with Bent Knee - 1 x daily - 7 x weekly - 2 sets - 10 reps  Patient Education Sleep Positions

## 2020-09-10 ENCOUNTER — Telehealth: Payer: Self-pay

## 2020-09-10 DIAGNOSIS — F419 Anxiety disorder, unspecified: Secondary | ICD-10-CM

## 2020-09-10 NOTE — Telephone Encounter (Signed)
Spoke to patient stated she wanted me to ask Dr.Jordan if he would refill her xanax.Stated she has been taking before Dr.Brackbill retired.Advised Dr.Jordan is out of office.I will speak to him when he is back in office.

## 2020-09-10 NOTE — Telephone Encounter (Signed)
Called patient about recent email she sent requesting to speak to me to discuss one of her medications.Left message on personal voice mail to call me back.

## 2020-09-10 NOTE — Telephone Encounter (Signed)
Pt is returning call.  

## 2020-09-12 ENCOUNTER — Ambulatory Visit: Payer: Medicare Other

## 2020-09-12 ENCOUNTER — Other Ambulatory Visit: Payer: Self-pay

## 2020-09-12 DIAGNOSIS — R2681 Unsteadiness on feet: Secondary | ICD-10-CM | POA: Diagnosis not present

## 2020-09-12 DIAGNOSIS — M6281 Muscle weakness (generalized): Secondary | ICD-10-CM

## 2020-09-12 DIAGNOSIS — R2689 Other abnormalities of gait and mobility: Secondary | ICD-10-CM | POA: Diagnosis not present

## 2020-09-12 DIAGNOSIS — I69351 Hemiplegia and hemiparesis following cerebral infarction affecting right dominant side: Secondary | ICD-10-CM

## 2020-09-12 DIAGNOSIS — G8929 Other chronic pain: Secondary | ICD-10-CM | POA: Diagnosis not present

## 2020-09-12 DIAGNOSIS — M545 Low back pain, unspecified: Secondary | ICD-10-CM | POA: Diagnosis not present

## 2020-09-12 NOTE — Therapy (Addendum)
[facility] [address] [place], [place], [address] Phone: [phone]   Fax:  [phone]  Physical Therapy Treatment  Patient Details  Name: Megan [name] MRN: [id number] Date of Birth: January 15, 1946 Referring Provider (PT): Glenford Peers, NP   Encounter Date: 09/12/2020    Past Medical History:  Diagnosis Date  . AAA (abdominal aortic aneurysm) ([place])    a. s/p stent grafting in 2009.  . Adenomatous colon polyp 05/2001  . Adenomatous duodenal polyp   . Allergy   . Anginal pain ([place])   . COPD (chronic obstructive pulmonary disease) ([place])    pt denies COPD  . Coronary artery disease    a. s/p CABG in 1995;  b. 05/2013 Neg MV, EF 82%;  c. 06/2013 Cath: LM nl, LAD 40-50p, LCX nl, RCA 80p/134m, VG->RCA->PDA->PLSA min irregs, LIMA->LAD atretic, vigorous LV fxn.  . Diabetes mellitus without complication (HCC)    history of, resolved. 2017  . Diverticulosis   . Eczema, dyshidrotic   . Eczema, dyshidrotic 1986   Dr. Mathis Fare  . Emphysema of lung ([place])   . Fatty liver   . Fibromyalgia   . GERD (gastroesophageal reflux disease)   . Hyperlipidemia   . Hypertension   . Migraine   . Myocardial infarction ([place])   . Osteoarthritis   . Osteoporosis    unsure, having a bone scan soon  . Peripheral arterial disease (HCC)    left leg diagnosed by Dr Mare Ferrari  . Renal artery stenosis ([place])    a. 2008 s/p PTA    Past Surgical History:  Procedure Laterality Date  . ABDOMINAL AORTAGRAM N/A 03/06/2014   Procedure: ABDOMINAL AORTAGRAM;  Surgeon: Rosetta Posner, MD;  Location: [facility] CATH LAB;  Service: Cardiovascular;  Laterality: N/A;  . ABDOMINAL AORTIC ANEURYSM REPAIR  2009   stenting  . ABDOMINAL HYSTERECTOMY     Total, 1992  . BREAST BIOPSY    . CARDIAC CATHETERIZATION    . CARDIOVASCULAR STRESS TEST  11/11/2009   normal study  . CARPAL TUNNEL RELEASE  08/1993   left wrist  . CARPAL TUNNEL RELEASE  01/1997   right   . CATARACT EXTRACTION, BILATERAL  2021  . CORONARY ARTERY BYPASS GRAFT  07/1994   Triple by Dr Ceasar Mons  . dental implants    . EYE SURGERY  03/2020   cataract  . HAND SURGERY Right 09/2002   Right hand pulley release  . LEFT HEART CATHETERIZATION WITH CORONARY/GRAFT ANGIOGRAM N/A 06/11/2013   Procedure: LEFT HEART CATHETERIZATION WITH [name];  Surgeon: Thayer Headings, MD;  Location: [facility] CATH LAB;  Service: Cardiovascular;  Laterality: N/A;  . MULTIPLE TOOTH EXTRACTIONS  2000   All upper teeth.  Has full dneture.   [name] RENAL ARTERY STENT  2008  . SHOULDER ARTHROSCOPY WITH ROTATOR CUFF REPAIR Right 09/27/2018   Procedure: Right shoulder mini open rotator cuff repair;  Surgeon: Susa Day, MD;  Location: [facility] ORS;  Service: Orthopedics;  Laterality: Right;  90 mins  . thyroid cyst apiration  05/1997 and 07/1998  . TONSILECTOMY, ADENOIDECTOMY, BILATERAL MYRINGOTOMY AND TUBES  1989  . TONSILLECTOMY    . TOTAL ABDOMINAL HYSTERECTOMY  1992    There were no vitals filed for this visit.       Manualt herapy In R sidelying: Myofascial release to laterl and posterior iliac crest border and left gluts  TherEx: Supine chin tuck with isometric cervical extensions: 10 x 5" holds Supine isometric  scapular contractions: 10 x 5" holds Supine isometric shoulder extensions: elbows bent: 10x 5" holds Supine glut sets: 10 x 5" holds Supine quad sets: 10 x 5" holds Supine isometric heel press: 10 x 5" holds (ipsilateral knee extended)                          PT Short Term Goals - 08/21/20 2124      PT SHORT TERM GOAL #1   Title Patient will be compliant with walking 10 min for 4x/week to initiate walking program    Baseline unable to walk 1/4 mile    Time 4    Period Weeks    Status New    Target Date 09/18/20      PT SHORT TERM GOAL #2   Title Patient will report <5/10 pain in her back with ADLs to improve function at home    Baseline 7/10  constant (eval)    Time 4    Period Weeks    Status New    Target Date 09/18/20             PT Long Term Goals - 08/21/20 2133      PT LONG TERM GOAL #1   Title Patient will demo 22/24 on DGI to improve balance with ambulation    Baseline 16/24 (eval)    Time 8    Period Weeks    Status New    Target Date 10/16/20      PT LONG TERM GOAL #2   Title Patient will demo 5x sit to stand without HHA under 17 seconds to improve functional strength    Baseline 25 seconds no HHA    Time 8    Period Weeks    Status New    Target Date 10/16/20      PT LONG TERM GOAL #3   Title Patient will report <3/10 pain in her loewr back with functional activities to improve overall function    Baseline 7/10 constant pain (eval)    Time 8    Period Weeks    Status New    Target Date 10/16/20                  Patient will benefit from skilled therapeutic intervention in order to improve the following deficits and impairments:  Abnormal gait, Decreased activity tolerance, Decreased balance, Decreased mobility, Decreased endurance, Decreased range of motion, Decreased strength, Hypomobility, Dizziness, Difficulty walking, Impaired flexibility, Postural dysfunction, Pain  Visit Diagnosis: Muscle weakness (generalized)  Other abnormalities of gait and mobility  Unsteadiness on feet     Problem List Patient Active Problem List   Diagnosis Date Noted  . S/P lumbar fusion 06/18/2020  . COPD with chronic bronchitis ([place]) 09/28/2018  . Essential hypertension 07/10/2018  . IFG (impaired fasting glucose) 11/10/2017  . DJD (degenerative joint disease), lumbosacral 11/10/2017  . Fibromyalgia 11/10/2017  . Dyshidrotic eczema 11/10/2017  . GERD (gastroesophageal reflux disease) 11/10/2017  . Osteopenia 11/10/2017  . Does use hearing aid 11/10/2017  . 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  . Fatty liver 11/10/2017  . Coronary artery disease   . Peripheral vascular disease with claudication  ([place])   . 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  . History of abdominal aortic aneurysm repair 05/18/2011  . History of renal artery stenosis 05/18/2011  . Mixed hyperlipidemia     Kerrie Pleasure, PT 09/16/2020, 2:58 PM  [facility] [address]  [place], [place], [address] Phone: [phone]   Fax:  [phone]  Name: Chrissie Dacquisto MRN: [id number] Date of Birth: 29-Aug-1946

## 2020-09-12 NOTE — Telephone Encounter (Signed)
Returned call to patient no answer.LMTC. 

## 2020-09-12 NOTE — Patient Instructions (Signed)
Access Code: [email or web address] URL: [email or web address] Date: 09/12/2020 Prepared by: Markus Jarvis  Exercises Supine Chin Tuck - 2 x daily - 7 x weekly - 10 reps - 5 hold Supine Scapular Retraction - 2 x daily - 7 x weekly - 10 reps - 5 hold Supine Isometric Shoulder Extension with Towel - 2 x daily - 7 x weekly - 10 reps - 5 hold Supine Gluteal Sets - 2 x daily - 7 x weekly - 10 reps - 5 hold Supine Quadricep Sets - 2 x daily - 7 x weekly - 10 reps - 5 hold Supine Multifidus with Heel Press - 2 x daily - 7 x weekly - 10 reps - 5 hold

## 2020-09-12 NOTE — Telephone Encounter (Signed)
    Pt returning Cheryl's call, she said to call her back after 2:30 pm today

## 2020-09-15 ENCOUNTER — Ambulatory Visit
Admission: RE | Admit: 2020-09-15 | Discharge: 2020-09-15 | Disposition: A | Discharge status: Home / Self Care | Payer: Medicare Other | Source: Ambulatory Visit | Attending: Vascular Surgery | Admitting: Vascular Surgery

## 2020-09-15 DIAGNOSIS — I714 Abdominal aortic aneurysm, without rupture, unspecified: Secondary | ICD-10-CM

## 2020-09-15 DIAGNOSIS — I708 Atherosclerosis of other arteries: Secondary | ICD-10-CM | POA: Diagnosis not present

## 2020-09-15 DIAGNOSIS — K5730 Diverticulosis of large intestine without perforation or abscess without bleeding: Secondary | ICD-10-CM | POA: Diagnosis not present

## 2020-09-15 DIAGNOSIS — I745 Embolism and thrombosis of iliac artery: Secondary | ICD-10-CM | POA: Diagnosis not present

## 2020-09-15 MED ORDER — IOPAMIDOL (ISOVUE-370) INJECTION 76%
75.0000 mL | Freq: Once | INTRAVENOUS | Status: AC | PRN
  Administered 2020-09-15: 75 mL via INTRAVENOUS

## 2020-09-15 MED ORDER — ALPRAZOLAM 0.25 MG PO TABS: 0.2500 mg | ORAL_TABLET | Freq: Every evening | ORAL | 3 refills | Status: DC | PRN

## 2020-09-15 NOTE — Telephone Encounter (Signed)
Spoke to patient Dr.Jordan advised he will refill Xanax.Refill phoned into pharmacy.

## 2020-09-15 NOTE — Addendum Note (Signed)
Addended by: Kathyrn Lass on: 09/15/2020 05:59 PM   Modules accepted: Orders

## 2020-09-16 ENCOUNTER — Ambulatory Visit: Payer: Medicare Other

## 2020-09-16 ENCOUNTER — Other Ambulatory Visit: Payer: Self-pay

## 2020-09-16 ENCOUNTER — Ambulatory Visit: Payer: Medicare Other | Admitting: Physical Therapy

## 2020-09-16 DIAGNOSIS — R2681 Unsteadiness on feet: Secondary | ICD-10-CM

## 2020-09-16 DIAGNOSIS — R2689 Other abnormalities of gait and mobility: Secondary | ICD-10-CM

## 2020-09-16 DIAGNOSIS — M6281 Muscle weakness (generalized): Secondary | ICD-10-CM | POA: Diagnosis not present

## 2020-09-16 DIAGNOSIS — I69351 Hemiplegia and hemiparesis following cerebral infarction affecting right dominant side: Secondary | ICD-10-CM | POA: Diagnosis not present

## 2020-09-16 DIAGNOSIS — M545 Low back pain, unspecified: Secondary | ICD-10-CM | POA: Diagnosis not present

## 2020-09-16 DIAGNOSIS — G8929 Other chronic pain: Secondary | ICD-10-CM | POA: Diagnosis not present

## 2020-09-16 NOTE — Therapy (Signed)
[facility] [address] [place], [place], [address] Phone: [phone]   Fax:  [phone]  Physical Therapy Treatment  Patient Details  Name: Megan [name] MRN: [id number] Date of Birth: 11-18-1946 Referring Provider (PT): Glenford Peers, NP   Encounter Date: 09/16/2020   PT End of Session - 09/16/20 1633    Visit Number 5    Number of Visits 17    Date for PT Re-Evaluation 10/16/20    Authorization Type Medicare/Medicaid    Progress Note Due on Visit 10    PT Start Time 0630    PT Stop Time 1530    PT Time Calculation (min) 45 min    Activity Tolerance Patient tolerated treatment well    Behavior During Therapy [facility] for tasks assessed/performed           Past Medical History:  Diagnosis Date  . AAA (abdominal aortic aneurysm) ([place])    a. s/p stent grafting in 2009.  . Adenomatous colon polyp 05/2001  . Adenomatous duodenal polyp   . Allergy   . Anginal pain ([place])   . COPD (chronic obstructive pulmonary disease) ([place])    pt denies COPD  . Coronary artery disease    a. s/p CABG in 1995;  b. 05/2013 Neg MV, EF 82%;  c. 06/2013 Cath: LM nl, LAD 40-50p, LCX nl, RCA 80p/164m, VG->RCA->PDA->PLSA min irregs, LIMA->LAD atretic, vigorous LV fxn.  . Diabetes mellitus without complication (HCC)    history of, resolved. 2017  . Diverticulosis   . Eczema, dyshidrotic   . Eczema, dyshidrotic 1986   Dr. Mathis Fare  . Emphysema of lung ([place])   . Fatty liver   . Fibromyalgia   . GERD (gastroesophageal reflux disease)   . Hyperlipidemia   . Hypertension   . Migraine   . Myocardial infarction ([place])   . Osteoarthritis   . Osteoporosis    unsure, having a bone scan soon  . Peripheral arterial disease (HCC)    left leg diagnosed by Dr Mare Ferrari  . Renal artery stenosis ([place])    a. 2008 s/p PTA    Past Surgical History:  Procedure Laterality Date  . ABDOMINAL AORTAGRAM N/A 03/06/2014   Procedure: ABDOMINAL  AORTAGRAM;  Surgeon: Rosetta Posner, MD;  Location: [facility] CATH LAB;  Service: Cardiovascular;  Laterality: N/A;  . ABDOMINAL AORTIC ANEURYSM REPAIR  2009   stenting  . ABDOMINAL HYSTERECTOMY     Total, 1992  . BREAST BIOPSY    . CARDIAC CATHETERIZATION    . CARDIOVASCULAR STRESS TEST  11/11/2009   normal study  . CARPAL TUNNEL RELEASE  08/1993   left wrist  . CARPAL TUNNEL RELEASE  01/1997   right  . CATARACT EXTRACTION, BILATERAL  2021  . CORONARY ARTERY BYPASS GRAFT  07/1994   Triple by Dr Ceasar Mons  . dental implants    . EYE SURGERY  03/2020   cataract  . HAND SURGERY Right 09/2002   Right hand pulley release  . LEFT HEART CATHETERIZATION WITH CORONARY/GRAFT ANGIOGRAM N/A 06/11/2013   Procedure: LEFT HEART CATHETERIZATION WITH [name];  Surgeon: Thayer Headings, MD;  Location: [facility] CATH LAB;  Service: Cardiovascular;  Laterality: N/A;  . MULTIPLE TOOTH EXTRACTIONS  2000   All upper teeth.  Has full dneture.   [name] RENAL ARTERY STENT  2008  . SHOULDER ARTHROSCOPY WITH ROTATOR CUFF REPAIR Right 09/27/2018   Procedure: Right shoulder mini open rotator cuff repair;  Surgeon: Susa Day,  MD;  Location: [facility] ORS;  Service: Orthopedics;  Laterality: Right;  90 mins  . thyroid cyst apiration  05/1997 and 07/1998  . TONSILECTOMY, ADENOIDECTOMY, BILATERAL MYRINGOTOMY AND TUBES  1989  . TONSILLECTOMY    . TOTAL ABDOMINAL HYSTERECTOMY  1992    There were no vitals filed for this visit.   Subjective Assessment - 09/16/20 1451    Subjective Pt reports pain is manageable today. Pt states she has been able to perform exercises daily but unable to perform twice a day. She has been assisting her husband with his HEP as well.    Pertinent History AAA, angina, COPD, CAD s/p CABG, DM, diverticulosis, fibromyalgia, HTN, migraine, MI, OA, PAD, Renal artery stenosis. back pain    Limitations Standing;Lifting;Walking;House hold activities    How long can you sit comfortably? no limits    How  long can you stand comfortably? 5-10 min    How long can you walk comfortably? 5-10 min    Diagnostic tests MRI: L3 compression fracture per pt report    Currently in Pain? Yes    Pain Score 4     Pain Location Back    Pain Onset More than a month ago                             [facility] Adult PT Treatment/Exercise - 09/16/20 0001      Lumbar Exercises: Standing   Other Standing Lumbar Exercises Hip abduction red tband 2x10 bilat      Lumbar Exercises: Seated   Other Seated Lumbar Exercises Scapular retraction x10 with 5 sec hold      Lumbar Exercises: Supine   Ab Set 10 reps;5 seconds    AB Set Limitations with pball press    Bent Knee Raise 20 reps    Other Supine Lumbar Exercises Glute set heel against pillow 10 x 5 sec      Manual Therapy   Soft tissue mobilization bilat piriformis & glute                  PT Education - 09/16/20 1637    Education Details Discussed strengthening for stability first prior to initiating a balance HEP    Person(s) Educated Patient    Methods Explanation;Demonstration;Verbal cues;Handout;Tactile cues    Comprehension Verbalized understanding;Verbal cues required;Tactile cues required            PT Short Term Goals - 08/21/20 2124      PT SHORT TERM GOAL #1   Title Patient will be compliant with walking 10 min for 4x/week to initiate walking program    Baseline unable to walk 1/4 mile    Time 4    Period Weeks    Status New    Target Date 09/18/20      PT SHORT TERM GOAL #2   Title Patient will report <5/10 pain in her back with ADLs to improve function at home    Baseline 7/10 constant (eval)    Time 4    Period Weeks    Status New    Target Date 09/18/20             PT Long Term Goals - 08/21/20 2133      PT LONG TERM GOAL #1   Title Patient will demo 22/24 on DGI to improve balance with ambulation    Baseline 16/24 (eval)    Time 8    Period Weeks  Status New    Target Date 10/16/20       PT LONG TERM GOAL #2   Title Patient will demo 5x sit to stand without HHA under 17 seconds to improve functional strength    Baseline 25 seconds no HHA    Time 8    Period Weeks    Status New    Target Date 10/16/20      PT LONG TERM GOAL #3   Title Patient will report <3/10 pain in her loewr back with functional activities to improve overall function    Baseline 7/10 constant pain (eval)    Time 8    Period Weeks    Status New    Target Date 10/16/20                 Plan - 09/16/20 1634    Clinical Impression Statement Treatment focused on continued core and bilat LE strengthening/stabilization. Initiated standing exercises. Performed manual therapy for pt's pain with improved pt report at end of session. Discussed performing self trigger point/self massage with tennis ball.    Personal Factors and Comorbidities Comorbidity 3+    Comorbidities AAA, angina, COPD, CAD s/p CABG, DM, diverticulosis, fibromyalgia, HTN, migraine, MI, OA, PAD, Renal artery stenosis. back pain    Examination-Activity Limitations Bathing;Bend;Caring for Others;Carry;Lift;Stairs;Stand;Squat;Sleep;Transfers    Examination-Participation Restrictions Cleaning;Community Activity;Driving;Laundry;Yard Work;Shop;Meal Prep    Stability/Clinical Decision Making Evolving/Moderate complexity    Rehab Potential Good    PT Frequency 2x / week    PT Duration 8 weeks    PT Treatment/Interventions ADLs/Self Care Home Management;Moist Heat;Neuromuscular re-education;Balance training;Therapeutic exercise;Therapeutic activities;Functional mobility training;Stair training;Gait training;Patient/family education;Manual techniques;Scar mobilization;Passive range of motion;Energy conservation;Joint Manipulations;Spinal Manipulations;Vestibular    PT Next Visit Plan Assess response to HEP. Assess STG. Continue to perform core and hip strengthening as pt is able to tolerate. Manual therapy if indicated. Initiate balance  exercises.    PT Home Exercise Plan Access Code [email or web address]; [email or web address]    Consulted and Agree with Plan of Care Patient           Patient will benefit from skilled therapeutic intervention in order to improve the following deficits and impairments:  Abnormal gait, Decreased activity tolerance, Decreased balance, Decreased mobility, Decreased endurance, Decreased range of motion, Decreased strength, Hypomobility, Dizziness, Difficulty walking, Impaired flexibility, Postural dysfunction, Pain  Visit Diagnosis: Muscle weakness (generalized)  Other abnormalities of gait and mobility  Unsteadiness on feet  Chronic bilateral low back pain without sciatica     Problem List Patient Active Problem List   Diagnosis Date Noted  . S/P lumbar fusion 06/18/2020  . COPD with chronic bronchitis ([place]) 09/28/2018  . Essential hypertension 07/10/2018  . IFG (impaired fasting glucose) 11/10/2017  . DJD (degenerative joint disease), lumbosacral 11/10/2017  . Fibromyalgia 11/10/2017  . Dyshidrotic eczema 11/10/2017  . GERD (gastroesophageal reflux disease) 11/10/2017  . Osteopenia 11/10/2017  . Does use hearing aid 11/10/2017  . 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  . Fatty liver 11/10/2017  . Coronary artery disease   . Peripheral vascular disease with claudication ([place])   . 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  . History of abdominal aortic aneurysm repair 05/18/2011  . History of renal artery stenosis 05/18/2011  . Mixed hyperlipidemia     A[place]za [place] April Ma L Jalicia Roszak PT, DPT 09/16/2020, 4:40 PM  [facility] [address] [place], [place], [address] Phone: [phone]   Fax:  [phone]  Name: Megan [name] MRN: [id number] Date of Birth: [birth year]

## 2020-09-16 NOTE — Patient Instructions (Signed)
Access Code: [email or web address] URL: [email or web address] Date: 09/16/2020 Prepared by: Estill Bamberg April Thurnell Garbe  Exercises Supine March - 1 x daily - 7 x weekly - 2 sets - 10 reps Seated Scapular Retraction - 1 x daily - 7 x weekly - 3 sets - 10 reps Hip Abduction with Resistance Loop - 1 x daily - 7 x weekly - 2 sets - 10 reps

## 2020-09-19 ENCOUNTER — Other Ambulatory Visit: Payer: Self-pay

## 2020-09-19 ENCOUNTER — Ambulatory Visit: Payer: Medicare Other

## 2020-09-19 DIAGNOSIS — M545 Low back pain, unspecified: Secondary | ICD-10-CM | POA: Diagnosis not present

## 2020-09-19 DIAGNOSIS — G8929 Other chronic pain: Secondary | ICD-10-CM | POA: Diagnosis not present

## 2020-09-19 DIAGNOSIS — I69351 Hemiplegia and hemiparesis following cerebral infarction affecting right dominant side: Secondary | ICD-10-CM | POA: Diagnosis not present

## 2020-09-19 DIAGNOSIS — R2681 Unsteadiness on feet: Secondary | ICD-10-CM | POA: Diagnosis not present

## 2020-09-19 DIAGNOSIS — M6281 Muscle weakness (generalized): Secondary | ICD-10-CM | POA: Diagnosis not present

## 2020-09-19 DIAGNOSIS — R2689 Other abnormalities of gait and mobility: Secondary | ICD-10-CM | POA: Diagnosis not present

## 2020-09-19 NOTE — Therapy (Signed)
[facility] [address] [place], [place], [address] Phone: [phone]   Fax:  [phone]  Physical Therapy Treatment  Patient Details  Name: Megan [name] MRN: [id number] Date of Birth: 31-Dec-1945 Referring Provider (PT): Glenford Peers, NP   Encounter Date: 09/19/2020   PT End of Session - 09/19/20 1357    Visit Number 6    Number of Visits 17    Date for PT Re-Evaluation 10/16/20    Authorization Type Medicare/Medicaid    Progress Note Due on Visit 10    PT Start Time 1315    PT Stop Time 1400    PT Time Calculation (min) 45 min    Activity Tolerance Patient tolerated treatment well    Behavior During Therapy [facility] for tasks assessed/performed           Past Medical History:  Diagnosis Date  . AAA (abdominal aortic aneurysm) ([place])    a. s/p stent grafting in 2009.  . Adenomatous colon polyp 05/2001  . Adenomatous duodenal polyp   . Allergy   . Anginal pain ([place])   . COPD (chronic obstructive pulmonary disease) ([place])    pt denies COPD  . Coronary artery disease    a. s/p CABG in 1995;  b. 05/2013 Neg MV, EF 82%;  c. 06/2013 Cath: LM nl, LAD 40-50p, LCX nl, RCA 80p/126m, VG->RCA->PDA->PLSA min irregs, LIMA->LAD atretic, vigorous LV fxn.  . Diabetes mellitus without complication (HCC)    history of, resolved. 2017  . Diverticulosis   . Eczema, dyshidrotic   . Eczema, dyshidrotic 1986   Dr. Mathis Fare  . Emphysema of lung ([place])   . Fatty liver   . Fibromyalgia   . GERD (gastroesophageal reflux disease)   . Hyperlipidemia   . Hypertension   . Migraine   . Myocardial infarction ([place])   . Osteoarthritis   . Osteoporosis    unsure, having a bone scan soon  . Peripheral arterial disease (HCC)    left leg diagnosed by Dr Mare Ferrari  . Renal artery stenosis ([place])    a. 2008 s/p PTA    Past Surgical History:  Procedure Laterality Date  . ABDOMINAL AORTAGRAM N/A 03/06/2014   Procedure: ABDOMINAL  AORTAGRAM;  Surgeon: Rosetta Posner, MD;  Location: [facility] CATH LAB;  Service: Cardiovascular;  Laterality: N/A;  . ABDOMINAL AORTIC ANEURYSM REPAIR  2009   stenting  . ABDOMINAL HYSTERECTOMY     Total, 1992  . BREAST BIOPSY    . CARDIAC CATHETERIZATION    . CARDIOVASCULAR STRESS TEST  11/11/2009   normal study  . CARPAL TUNNEL RELEASE  08/1993   left wrist  . CARPAL TUNNEL RELEASE  01/1997   right  . CATARACT EXTRACTION, BILATERAL  2021  . CORONARY ARTERY BYPASS GRAFT  07/1994   Triple by Dr Ceasar Mons  . dental implants    . EYE SURGERY  03/2020   cataract  . HAND SURGERY Right 09/2002   Right hand pulley release  . LEFT HEART CATHETERIZATION WITH CORONARY/GRAFT ANGIOGRAM N/A 06/11/2013   Procedure: LEFT HEART CATHETERIZATION WITH [name];  Surgeon: Thayer Headings, MD;  Location: [facility] CATH LAB;  Service: Cardiovascular;  Laterality: N/A;  . MULTIPLE TOOTH EXTRACTIONS  2000   All upper teeth.  Has full dneture.   [name] RENAL ARTERY STENT  2008  . SHOULDER ARTHROSCOPY WITH ROTATOR CUFF REPAIR Right 09/27/2018   Procedure: Right shoulder mini open rotator cuff repair;  Surgeon: Susa Day,  MD;  Location: [facility] ORS;  Service: Orthopedics;  Laterality: Right;  90 mins  . thyroid cyst apiration  05/1997 and 07/1998  . TONSILECTOMY, ADENOIDECTOMY, BILATERAL MYRINGOTOMY AND TUBES  1989  . TONSILLECTOMY    . TOTAL ABDOMINAL HYSTERECTOMY  1992    There were no vitals filed for this visit.   Subjective Assessment - 09/19/20 1325    Subjective Pt reports pain is manageable today. Pt states she has been able to perform exercises daily but unable to perform twice a day. She has been assisting her husband with his HEP as well.    Pertinent History AAA, angina, COPD, CAD s/p CABG, DM, diverticulosis, fibromyalgia, HTN, migraine, MI, OA, PAD, Renal artery stenosis. back pain    Limitations Standing;Lifting;Walking;House hold activities    How long can you sit comfortably? no limits    How  long can you stand comfortably? 5-10 min    How long can you walk comfortably? 5-10 min    Diagnostic tests MRI: L3 compression fracture per pt report    Pain Score 3     Pain Location Back    Pain Orientation Left;Right    Pain Descriptors / Indicators Aching;Tightness    Pain Type Surgical pain    Pain Onset More than a month ago    Pain Frequency Constant              [facility] PT Assessment - 09/19/20 1359      Standardized Balance Assessment   Five times sit to stand comments  14 seconds              Goals reassessed today Manual therapy: myofascial release to bil posterior iliac crest borders, spinous processess and sacral sulcus in lumbar region  TherEx: Isometric 90 deg knee flexion with ispitalateral UE:  10 x 5" holds, 10 x 10" holds R and L, with education on aerobic and anerobic muscles and how she has to hold contraction longer to improve tansverse abdominis activation. Pt cued to only put in 20% effort with isometric contraction to prevent compensation Reviewed extensior stabilization exercises verbally from previous HEP   Reviewed log rolling technique as patient was trying to "sit up" getting out of bed.                  PT Short Term Goals - 09/19/20 1325      PT SHORT TERM GOAL #1   Title Patient will be compliant with walking 10 min for 4x/week to initiate walking program    Baseline unable to walk 1/4 mile    Time 4    Period Weeks    Status Achieved    Target Date 09/18/20      PT SHORT TERM GOAL #2   Title Patient will report <5/10 pain in her back with ADLs to improve function at home    Baseline 7/10 constant (eval)    Time 4    Period Weeks    Status Achieved    Target Date 09/18/20             PT Long Term Goals - 09/19/20 1355      PT LONG TERM GOAL #1   Title Patient will demo 22/24 on DGI to improve balance with ambulation    Baseline 16/24 (eval)    Time 8    Period Weeks    Status New      PT LONG TERM  GOAL #2   Title Patient will demo 5x  sit to stand without HHA under 17 seconds to improve functional strength    Baseline 25 seconds no HHA; 14 seconds 09/19/20    Time 8    Period Weeks    Status Achieved      PT LONG TERM GOAL #3   Title Patient will report <3/10 pain in her loewr back with functional activities to improve overall function    Baseline 7/10 constant pain (eval); 5/10 pm 09/19/20    Time 8    Period Weeks    Status New                 Plan - 09/19/20 1355    Clinical Impression Statement Pt met both of the short term goals today and making progress towards long term goals. We progressed core strengthening today. and pt tolerated progression well.    Personal Factors and Comorbidities Comorbidity 3+    Comorbidities AAA, angina, COPD, CAD s/p CABG, DM, diverticulosis, fibromyalgia, HTN, migraine, MI, OA, PAD, Renal artery stenosis. back pain    Examination-Activity Limitations Bathing;Bend;Caring for Others;Carry;Lift;Stairs;Stand;Squat;Sleep;Transfers    Examination-Participation Restrictions Cleaning;Community Activity;Driving;Laundry;Yard Work;Shop;Meal Prep    Stability/Clinical Decision Making Evolving/Moderate complexity    Rehab Potential Good    PT Frequency 2x / week    PT Duration 8 weeks    PT Treatment/Interventions ADLs/Self Care Home Management;Moist Heat;Neuromuscular re-education;Balance training;Therapeutic exercise;Therapeutic activities;Functional mobility training;Stair training;Gait training;Patient/family education;Manual techniques;Scar mobilization;Passive range of motion;Energy conservation;Joint Manipulations;Spinal Manipulations;Vestibular    PT Next Visit Plan Assess response to HEP. Assess STG. Continue to perform core and hip strengthening as pt is able to tolerate. Manual therapy if indicated. Initiate balance exercises.    PT Home Exercise Plan Access Code [email or web address]; [email or web address]    Consulted and Agree with Plan of Care Patient            Patient will benefit from skilled therapeutic intervention in order to improve the following deficits and impairments:  Abnormal gait, Decreased activity tolerance, Decreased balance, Decreased mobility, Decreased endurance, Decreased range of motion, Decreased strength, Hypomobility, Dizziness, Difficulty walking, Impaired flexibility, Postural dysfunction, Pain  Visit Diagnosis: Muscle weakness (generalized)  Other abnormalities of gait and mobility  Unsteadiness on feet  Chronic bilateral low back pain without sciatica     Problem List Patient Active Problem List   Diagnosis Date Noted  . S/P lumbar fusion 06/18/2020  . COPD with chronic bronchitis ([place]) 09/28/2018  . Essential hypertension 07/10/2018  . IFG (impaired fasting glucose) 11/10/2017  . DJD (degenerative joint disease), lumbosacral 11/10/2017  . Fibromyalgia 11/10/2017  . Dyshidrotic eczema 11/10/2017  . GERD (gastroesophageal reflux disease) 11/10/2017  . Osteopenia 11/10/2017  . Does use hearing aid 11/10/2017  . 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  . Fatty liver 11/10/2017  . Coronary artery disease   . Peripheral vascular disease with claudication ([place])   . 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  . History of abdominal aortic aneurysm repair 05/18/2011  . History of renal artery stenosis 05/18/2011  . Mixed hyperlipidemia     Kerrie Pleasure 09/19/2020, 3:04 PM  [facility] [address] [place], [place], [address] Phone: [phone]   Fax:  [phone]  Name: Megan [name] MRN: [id number] Date of Birth: 28-Apr-1946

## 2020-09-23 ENCOUNTER — Encounter: Payer: Self-pay | Admitting: Physical Therapy

## 2020-09-23 ENCOUNTER — Ambulatory Visit: Payer: Medicare Other | Admitting: Physical Therapy

## 2020-09-23 ENCOUNTER — Other Ambulatory Visit: Payer: Self-pay

## 2020-09-23 DIAGNOSIS — R2681 Unsteadiness on feet: Secondary | ICD-10-CM

## 2020-09-23 DIAGNOSIS — M6281 Muscle weakness (generalized): Secondary | ICD-10-CM | POA: Diagnosis not present

## 2020-09-23 DIAGNOSIS — R2689 Other abnormalities of gait and mobility: Secondary | ICD-10-CM | POA: Diagnosis not present

## 2020-09-23 DIAGNOSIS — M545 Low back pain, unspecified: Secondary | ICD-10-CM

## 2020-09-23 DIAGNOSIS — G8929 Other chronic pain: Secondary | ICD-10-CM

## 2020-09-23 DIAGNOSIS — I69351 Hemiplegia and hemiparesis following cerebral infarction affecting right dominant side: Secondary | ICD-10-CM | POA: Diagnosis not present

## 2020-09-23 NOTE — Therapy (Signed)
[facility] [address] [place], [place], [address] Phone: [phone]   Fax:  [phone]  Physical Therapy Treatment  Patient Details  Name: Megan [name] MRN: [id number] Date of Birth: 1945/12/11 Referring Provider (PT): Glenford Peers, NP   Encounter Date: 09/23/2020   PT End of Session - 09/23/20 1443    Visit Number 7    Number of Visits 17    Date for PT Re-Evaluation 10/16/20    Authorization Type Medicare/Medicaid    Progress Note Due on Visit 10    PT Start Time 2979    PT Stop Time 1318    PT Time Calculation (min) 43 min    Equipment Utilized During Treatment Gait belt;Back brace    Activity Tolerance Patient tolerated treatment well    Behavior During Therapy [facility] for tasks assessed/performed           Past Medical History:  Diagnosis Date  . AAA (abdominal aortic aneurysm) ([place])    a. s/p stent grafting in 2009.  . Adenomatous colon polyp 05/2001  . Adenomatous duodenal polyp   . Allergy   . Anginal pain ([place])   . COPD (chronic obstructive pulmonary disease) ([place])    pt denies COPD  . Coronary artery disease    a. s/p CABG in 1995;  b. 05/2013 Neg MV, EF 82%;  c. 06/2013 Cath: LM nl, LAD 40-50p, LCX nl, RCA 80p/120m, VG->RCA->PDA->PLSA min irregs, LIMA->LAD atretic, vigorous LV fxn.  . Diabetes mellitus without complication (HCC)    history of, resolved. 2017  . Diverticulosis   . Eczema, dyshidrotic   . Eczema, dyshidrotic 1986   Dr. Mathis Fare  . Emphysema of lung ([place])   . Fatty liver   . Fibromyalgia   . GERD (gastroesophageal reflux disease)   . Hyperlipidemia   . Hypertension   . Migraine   . Myocardial infarction ([place])   . Osteoarthritis   . Osteoporosis    unsure, having a bone scan soon  . Peripheral arterial disease (HCC)    left leg diagnosed by Dr Mare Ferrari  . Renal artery stenosis ([place])    a. 2008 s/p PTA    Past Surgical History:  Procedure Laterality Date    . ABDOMINAL AORTAGRAM N/A 03/06/2014   Procedure: ABDOMINAL AORTAGRAM;  Surgeon: Rosetta Posner, MD;  Location: [facility] CATH LAB;  Service: Cardiovascular;  Laterality: N/A;  . ABDOMINAL AORTIC ANEURYSM REPAIR  2009   stenting  . ABDOMINAL HYSTERECTOMY     Total, 1992  . BREAST BIOPSY    . CARDIAC CATHETERIZATION    . CARDIOVASCULAR STRESS TEST  11/11/2009   normal study  . CARPAL TUNNEL RELEASE  08/1993   left wrist  . CARPAL TUNNEL RELEASE  01/1997   right  . CATARACT EXTRACTION, BILATERAL  2021  . CORONARY ARTERY BYPASS GRAFT  07/1994   Triple by Dr Ceasar Mons  . dental implants    . EYE SURGERY  03/2020   cataract  . HAND SURGERY Right 09/2002   Right hand pulley release  . LEFT HEART CATHETERIZATION WITH CORONARY/GRAFT ANGIOGRAM N/A 06/11/2013   Procedure: LEFT HEART CATHETERIZATION WITH [name];  Surgeon: Thayer Headings, MD;  Location[facility] CATH LAB;  Service: Cardiovascular;  Laterality: N/A;  . MULTIPLE TOOTH EXTRACTIONS  2000   All upper teeth.  Has full dneture.   [name] RENAL ARTERY STENT  2008  . SHOULDER ARTHROSCOPY WITH ROTATOR CUFF REPAIR Right 09/27/2018   Procedure:  Right shoulder mini open rotator cuff repair;  Surgeon: Susa Day, MD;  Location: [facility] ORS;  Service: Orthopedics;  Laterality: Right;  90 mins  . thyroid cyst apiration  05/1997 and 07/1998  . TONSILECTOMY, ADENOIDECTOMY, BILATERAL MYRINGOTOMY AND TUBES  1989  . TONSILLECTOMY    . TOTAL ABDOMINAL HYSTERECTOMY  1992    There were no vitals filed for this visit.   Subjective Assessment - 09/23/20 1238    Subjective Pt states she is told she veers towards the left. Pt notes going up and down the stairs feels a tinge better.    Pertinent History AAA, angina, COPD, CAD s/p CABG, DM, diverticulosis, fibromyalgia, HTN, migraine, MI, OA, PAD, Renal artery stenosis. back pain    Limitations Standing;Lifting;Walking;House hold activities    How long can you sit comfortably? no limits    How long can you  stand comfortably? 5-10 min    How long can you walk comfortably? 5-10 min    Diagnostic tests MRI: L3 compression fracture per pt report    Currently in Pain? Yes    Pain Score 4     Pain Location Back    Pain Orientation Left;Right    Pain Descriptors / Indicators Aching    Pain Type Surgical pain    Pain Onset More than a month ago                             [facility] Adult PT Treatment/Exercise - 09/23/20 0001      Lumbar Exercises: Standing   Other Standing Lumbar Exercises Hip abduction red tband 2x10 bilat; lateral band walks red tband in // bars 2x10' bilat    Other Standing Lumbar Exercises Hip extension red tband 2x10               Balance Exercises - 09/23/20 0001      Balance Exercises: Standing   Standing Eyes Opened Head turns;Foam/compliant surface;20 secs    Tandem Stance Eyes open;20 secs   with head turns   SLS with Vectors Foam/compliant surface;Intermittent upper extremity assist   x10 reps foot tapping forward, side & back bilat   Rockerboard Anterior/posterior;EO;Lateral;30 seconds;Intermittent UE support    Other Standing Exercises cariocas 4x10' bilat               PT Short Term Goals - 09/19/20 1325      PT SHORT TERM GOAL #1   Title Patient will be compliant with walking 10 min for 4x/week to initiate walking program    Baseline unable to walk 1/4 mile    Time 4    Period Weeks    Status Achieved    Target Date 09/18/20      PT SHORT TERM GOAL #2   Title Patient will report <5/10 pain in her back with ADLs to improve function at home    Baseline 7/10 constant (eval)    Time 4    Period Weeks    Status Achieved    Target Date 09/18/20             PT Long Term Goals - 09/19/20 1355      PT LONG TERM GOAL #1   Title Patient will demo 22/24 on DGI to improve balance with ambulation    Baseline 16/24 (eval)    Time 8    Period Weeks    Status New      PT LONG TERM GOAL #2  Title Patient will demo 5x sit to  stand without HHA under 17 seconds to improve functional strength    Baseline 25 seconds no HHA; 14 seconds 09/19/20    Time 8    Period Weeks    Status Achieved      PT LONG TERM GOAL #3   Title Patient will report <3/10 pain in her loewr back with functional activities to improve overall function    Baseline 7/10 constant pain (eval); 5/10 pm 09/19/20    Time 8    Period Weeks    Status New                 Plan - 09/23/20 1440    Clinical Impression Statement Treatment focused on progressing pt's standing exercises and balance. PT continued to strengthen pt's hips with red tband. Initiated balance on foam surface and dynamic gait. Pt with R hip cramping after exercises addressed with manual therapy.    Personal Factors and Comorbidities Comorbidity 3+    Comorbidities AAA, angina, COPD, CAD s/p CABG, DM, diverticulosis, fibromyalgia, HTN, migraine, MI, OA, PAD, Renal artery stenosis. back pain    Examination-Activity Limitations Bathing;Bend;Caring for Others;Carry;Lift;Stairs;Stand;Squat;Sleep;Transfers    Examination-Participation Restrictions Cleaning;Community Activity;Driving;Laundry;Yard Work;Shop;Meal Prep    Stability/Clinical Decision Making Evolving/Moderate complexity    Rehab Potential Good    PT Frequency 2x / week    PT Duration 8 weeks    PT Treatment/Interventions ADLs/Self Care Home Management;Moist Heat;Neuromuscular re-education;Balance training;Therapeutic exercise;Therapeutic activities;Functional mobility training;Stair training;Gait training;Patient/family education;Manual techniques;Scar mobilization;Passive range of motion;Energy conservation;Joint Manipulations;Spinal Manipulations;Vestibular    PT Next Visit Plan Assess response to HEP. Continue to perform core and hip strengthening as pt is able to tolerate. Progress static & dynamic balance. Manual therapy if indicated.    PT Home Exercise Plan Access Code [email or web address]; [email or web address]    Consulted and Agree  with Plan of Care Patient           Patient will benefit from skilled therapeutic intervention in order to improve the following deficits and impairments:  Abnormal gait, Decreased activity tolerance, Decreased balance, Decreased mobility, Decreased endurance, Decreased range of motion, Decreased strength, Hypomobility, Dizziness, Difficulty walking, Impaired flexibility, Postural dysfunction, Pain  Visit Diagnosis: Muscle weakness (generalized)  Other abnormalities of gait and mobility  Unsteadiness on feet  Chronic bilateral low back pain without sciatica     Problem List Patient Active Problem List   Diagnosis Date Noted  . S/P lumbar fusion 06/18/2020  . COPD with chronic bronchitis ([place]) 09/28/2018  . Essential hypertension 07/10/2018  . IFG (impaired fasting glucose) 11/10/2017  . DJD (degenerative joint disease), lumbosacral 11/10/2017  . Fibromyalgia 11/10/2017  . Dyshidrotic eczema 11/10/2017  . GERD (gastroesophageal reflux disease) 11/10/2017  . Osteopenia 11/10/2017  . Does use hearing aid 11/10/2017  . 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  . Fatty liver 11/10/2017  . Coronary artery disease   . Peripheral vascular disease with claudication ([place])   . 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  . History of abdominal aortic aneurysm repair 05/18/2011  . History of renal artery stenosis 05/18/2011  . Mixed hyperlipidemia     Journe Hallmark April Ma L Alfonzia Woolum PT, DPT 09/23/2020, 6:44 PM  [facility] [address] [place], [place], [address] Phone: [phone]   Fax:  [phone]  Name: Akosua Constantine MRN: [id number] Date of Birth: May 04, 1946

## 2020-09-26 ENCOUNTER — Other Ambulatory Visit: Payer: Self-pay

## 2020-09-26 ENCOUNTER — Ambulatory Visit: Payer: Medicare Other

## 2020-09-26 ENCOUNTER — Encounter: Payer: Self-pay | Admitting: Vascular Surgery

## 2020-09-26 ENCOUNTER — Ambulatory Visit (INDEPENDENT_AMBULATORY_CARE_PROVIDER_SITE_OTHER): Payer: Medicare Other | Admitting: Vascular Surgery

## 2020-09-26 VITALS — BP 113/71 | HR 73 | Temp 97.2°F | Resp 20 | Ht 60.0 in | Wt 134.0 lb

## 2020-09-26 DIAGNOSIS — R2689 Other abnormalities of gait and mobility: Secondary | ICD-10-CM | POA: Diagnosis not present

## 2020-09-26 DIAGNOSIS — Z95828 Presence of other vascular implants and grafts: Secondary | ICD-10-CM | POA: Diagnosis not present

## 2020-09-26 DIAGNOSIS — M545 Low back pain, unspecified: Secondary | ICD-10-CM

## 2020-09-26 DIAGNOSIS — G8929 Other chronic pain: Secondary | ICD-10-CM

## 2020-09-26 DIAGNOSIS — R2681 Unsteadiness on feet: Secondary | ICD-10-CM

## 2020-09-26 DIAGNOSIS — M6281 Muscle weakness (generalized): Secondary | ICD-10-CM | POA: Diagnosis not present

## 2020-09-26 DIAGNOSIS — I69351 Hemiplegia and hemiparesis following cerebral infarction affecting right dominant side: Secondary | ICD-10-CM | POA: Diagnosis not present

## 2020-09-26 NOTE — Therapy (Signed)
[facility] [address] [place], [place], [address] Phone: [phone]   Fax:  [phone]  Physical Therapy Treatment  Patient Details  Name: Megan [name] MRN: [id number] Date of Birth: 1946-09-23 Referring Provider (PT): Glenford Peers, NP   Encounter Date: 09/26/2020   PT End of Session - 09/26/20 1417    Visit Number 8    Number of Visits 17    Date for PT Re-Evaluation 10/16/20    Authorization Type Medicare/Medicaid    Progress Note Due on Visit 10    PT Start Time 1315    PT Stop Time 1400    PT Time Calculation (min) 45 min    Equipment Utilized During Treatment Gait belt;Back brace    Activity Tolerance Patient tolerated treatment well    Behavior During Therapy [facility] for tasks assessed/performed           Past Medical History:  Diagnosis Date  . AAA (abdominal aortic aneurysm) ([place])    a. s/p stent grafting in 2009.  . Adenomatous colon polyp 05/2001  . Adenomatous duodenal polyp   . Allergy   . Anginal pain ([place])   . COPD (chronic obstructive pulmonary disease) ([place])    pt denies COPD  . Coronary artery disease    a. s/p CABG in 1995;  b. 05/2013 Neg MV, EF 82%;  c. 06/2013 Cath: LM nl, LAD 40-50p, LCX nl, RCA 80p/138m, VG->RCA->PDA->PLSA min irregs, LIMA->LAD atretic, vigorous LV fxn.  . Diabetes mellitus without complication (HCC)    history of, resolved. 2017  . Diverticulosis   . Eczema, dyshidrotic   . Eczema, dyshidrotic 1986   Dr. Mathis Fare  . Emphysema of lung ([place])   . Fatty liver   . Fibromyalgia   . GERD (gastroesophageal reflux disease)   . Hyperlipidemia   . Hypertension   . Migraine   . Myocardial infarction ([place])   . Osteoarthritis   . Osteoporosis    unsure, having a bone scan soon  . Peripheral arterial disease (HCC)    left leg diagnosed by Dr Mare Ferrari  . Renal artery stenosis ([place])    a. 2008 s/p PTA    Past Surgical History:  Procedure Laterality Date   . ABDOMINAL AORTAGRAM N/A 03/06/2014   Procedure: ABDOMINAL AORTAGRAM;  Surgeon: Rosetta Posner, MD;  Location: [facility] CATH LAB;  Service: Cardiovascular;  Laterality: N/A;  . ABDOMINAL AORTIC ANEURYSM REPAIR  2009   stenting  . ABDOMINAL HYSTERECTOMY     Total, 1992  . BREAST BIOPSY    . CARDIAC CATHETERIZATION    . CARDIOVASCULAR STRESS TEST  11/11/2009   normal study  . CARPAL TUNNEL RELEASE  08/1993   left wrist  . CARPAL TUNNEL RELEASE  01/1997   right  . CATARACT EXTRACTION, BILATERAL  2021  . CORONARY ARTERY BYPASS GRAFT  07/1994   Triple by Dr Ceasar Mons  . dental implants    . EYE SURGERY  03/2020   cataract  . HAND SURGERY Right 09/2002   Right hand pulley release  . LEFT HEART CATHETERIZATION WITH CORONARY/GRAFT ANGIOGRAM N/A 06/11/2013   Procedure: LEFT HEART CATHETERIZATION WITH [name];  Surgeon: Thayer Headings, MD;  Location: [facility] CATH LAB;  Service: Cardiovascular;  Laterality: N/A;  . MULTIPLE TOOTH EXTRACTIONS  2000   All upper teeth.  Has full dneture.   [name] RENAL ARTERY STENT  2008  . SHOULDER ARTHROSCOPY WITH ROTATOR CUFF REPAIR Right 09/27/2018   Procedure: Right  shoulder mini open rotator cuff repair;  Surgeon: Susa Day, MD;  Location: [facility] ORS;  Service: Orthopedics;  Laterality: Right;  90 mins  . thyroid cyst apiration  05/1997 and 07/1998  . TONSILECTOMY, ADENOIDECTOMY, BILATERAL MYRINGOTOMY AND TUBES  1989  . TONSILLECTOMY    . TOTAL ABDOMINAL HYSTERECTOMY  1992    There were no vitals filed for this visit.   Subjective Assessment - 09/26/20 1328    Subjective Pt reports pain is about 3-41/10. She is taking Alleve over Naproxen at times. She is not trying to take pain meds every day.    Pertinent History AAA, angina, COPD, CAD s/p CABG, DM, diverticulosis, fibromyalgia, HTN, migraine, MI, OA, PAD, Renal artery stenosis. back pain    Limitations Standing;Lifting;Walking;House hold activities    How long can you sit comfortably? no limits     How long can you stand comfortably? 5-10 min    How long can you walk comfortably? 5-10 min    Diagnostic tests MRI: L3 compression fracture per pt report    Currently in Pain? Yes    Pain Score 4     Pain Location Back    Pain Orientation Right;Left    Pain Onset More than a month ago    Pain Frequency Constant                Pt asked to perform ROM in sitting: Baseline pain with sitting: 3/10 - seated lumbar flexion: pain increased to 4.5/10 - seated lateral flexion: pain increased to 4.5/10 bil - Seated trunk twist: pain decreased to 2/10  Worked on hinging from waist to pick up 2lb ball from mat table, needed moderate to max cueing, pt cued to push the hips back while keeping knees slightly bent. Cues for sticking belly out. ~30 reps during the session  Seated trunk twists: 10x R and L- doing twising helped to reduce pain to 2/10 with lumbar flexion and lumbar lateral flexion afterwards.  Towards the end of the session, pt was able to touch the top of treadmill belt without pain increasing from 4/10                       PT Short Term Goals - 09/19/20 1325      PT SHORT TERM GOAL #1   Title Patient will be compliant with walking 10 min for 4x/week to initiate walking program    Baseline unable to walk 1/4 mile    Time 4    Period Weeks    Status Achieved    Target Date 09/18/20      PT SHORT TERM GOAL #2   Title Patient will report <5/10 pain in her back with ADLs to improve function at home    Baseline 7/10 constant (eval)    Time 4    Period Weeks    Status Achieved    Target Date 09/18/20             PT Long Term Goals - 09/19/20 1355      PT LONG TERM GOAL #1   Title Patient will demo 22/24 on DGI to improve balance with ambulation    Baseline 16/24 (eval)    Time 8    Period Weeks    Status New      PT LONG TERM GOAL #2   Title Patient will demo 5x sit to stand without HHA under 17 seconds to improve functional strength     Baseline 25  seconds no HHA; 14 seconds 09/19/20    Time 8    Period Weeks    Status Achieved      PT LONG TERM GOAL #3   Title Patient will report <3/10 pain in her loewr back with functional activities to improve overall function    Baseline 7/10 constant pain (eval); 5/10 pm 09/19/20    Time 8    Period Weeks    Status New                 Plan - 09/26/20 1412    Clinical Impression Statement Today's session was focused on educating anatomy of spine and learning "neutral" spine posture to protect the joints and improve muscle activation of core/lumbar stabilizers especially when she is bending forward. We focused on learing the concept of "hip hinge" to improve neutral spine. Patient needs mod to max cueing with hip ihinging and will need further education. Pt reported improved pain with lumbar flexion and lateral flexion after performing lumbar twisting stretch in seated position.    Personal Factors and Comorbidities Comorbidity 3+    Comorbidities AAA, angina, COPD, CAD s/p CABG, DM, diverticulosis, fibromyalgia, HTN, migraine, MI, OA, PAD, Renal artery stenosis. back pain    Examination-Activity Limitations Bathing;Bend;Caring for Others;Carry;Lift;Stairs;Stand;Squat;Sleep;Transfers    Examination-Participation Restrictions Cleaning;Community Activity;Driving;Laundry;Yard Work;Shop;Meal Prep    Stability/Clinical Decision Making Evolving/Moderate complexity    Rehab Potential Good    PT Frequency 2x / week    PT Duration 8 weeks    PT Treatment/Interventions ADLs/Self Care Home Management;Moist Heat;Neuromuscular re-education;Balance training;Therapeutic exercise;Therapeutic activities;Functional mobility training;Stair training;Gait training;Patient/family education;Manual techniques;Scar mobilization;Passive range of motion;Energy conservation;Joint Manipulations;Spinal Manipulations;Vestibular    PT Next Visit Plan Assess response to HEP. Continue to perform core and hip  strengthening as pt is able to tolerate. Progress static & dynamic balance. Manual therapy if indicated.    PT Home Exercise Plan Access Code [email or web address]; [email or web address]    Consulted and Agree with Plan of Care Patient           Patient will benefit from skilled therapeutic intervention in order to improve the following deficits and impairments:  Abnormal gait, Decreased activity tolerance, Decreased balance, Decreased mobility, Decreased endurance, Decreased range of motion, Decreased strength, Hypomobility, Dizziness, Difficulty walking, Impaired flexibility, Postural dysfunction, Pain  Visit Diagnosis: Muscle weakness (generalized)  Other abnormalities of gait and mobility  Unsteadiness on feet  Chronic bilateral low back pain without sciatica     Problem List Patient Active Problem List   Diagnosis Date Noted  . S/P lumbar fusion 06/18/2020  . COPD with chronic bronchitis ([place]) 09/28/2018  . Essential hypertension 07/10/2018  . IFG (impaired fasting glucose) 11/10/2017  . DJD (degenerative joint disease), lumbosacral 11/10/2017  . Fibromyalgia 11/10/2017  . Dyshidrotic eczema 11/10/2017  . GERD (gastroesophageal reflux disease) 11/10/2017  . Osteopenia 11/10/2017  . Does use hearing aid 11/10/2017  . 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  . Fatty liver 11/10/2017  . Coronary artery disease   . Peripheral vascular disease with claudication ([place])   . 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  . History of abdominal aortic aneurysm repair 05/18/2011  . History of renal artery stenosis 05/18/2011  . Mixed hyperlipidemia     Kerrie Pleasure 09/26/2020, 2:28 PM  [facility] [address] [place], [place], [address] Phone: [phone]   Fax:  [phone]  Name: Annaliah Rivenbark MRN: [id number] Date of Birth: Feb 03, 1946

## 2020-09-26 NOTE — Progress Notes (Signed)
Patient ID: Megan [name], female   DOB: 1946/10/18, 73 y.o.   MRN: [id number]  Reason for Consult: Follow-up   Referred by [name], MD  Subjective:     HPI:  Megan [name] is a 74 y.o. female previous history of abdominal aortic stent placed in 2009. She had a reintervention of her left common iliac artery. She also has a stent in her right renal artery. She has done very well from this no new back or abdominal pain. Previously was evaluated with [name] and the CT scan was scheduled.  Past Medical History:  Diagnosis Date  . AAA (abdominal aortic aneurysm) ([place])    a. s/p stent grafting in 2009.  . Adenomatous colon polyp 05/2001  . Adenomatous duodenal polyp   . Allergy   . Anginal pain ([place])   . COPD (chronic obstructive pulmonary disease) ([place])    pt denies COPD  . Coronary artery disease    a. s/p CABG in 1995;  b. 05/2013 Neg MV, EF 82%;  c. 06/2013 Cath: LM nl, LAD 40-50p, LCX nl, RCA 80p/124m, VG->RCA->PDA->PLSA min irregs, LIMA->LAD atretic, vigorous LV fxn.  . Diabetes mellitus without complication (HCC)    history of, resolved. 2017  . Diverticulosis   . Eczema, dyshidrotic   . Eczema, dyshidrotic 1986   Dr. Mathis Fare  . Emphysema of lung ([place])   . Fatty liver   . Fibromyalgia   . GERD (gastroesophageal reflux disease)   . Hyperlipidemia   . Hypertension   . Migraine   . Myocardial infarction ([place])   . Osteoarthritis   . Osteoporosis    unsure, having a bone scan soon  . Peripheral arterial disease (HCC)    left leg diagnosed by Dr Mare Ferrari  . Renal artery stenosis ([place])    a. 2008 s/p PTA   Family History  Problem Relation Age of Onset  . Heart disease Mother   . Heart attack Mother   . Heart disease Father        before age 35  . Heart attack Father   . Heart disease Sister        before age 65  . Diabetes Sister   . Hyperlipidemia Sister   . Hypertension Sister   . Heart attack Sister   . Liver cancer Sister   . Breast  cancer Sister   . Heart disease Brother   . Diabetes Brother   . Hyperlipidemia Brother   . Hypertension Brother   . Heart attack Brother   . AAA (abdominal aortic aneurysm) Brother   . Colon cancer Paternal Grandmother   . Heart disease Brother   . Colon cancer Cousin        first cousin on father's side  . Pancreatic cancer Neg Hx   . Rectal cancer Neg Hx   . Stomach cancer Neg Hx   . Esophageal cancer Neg Hx    Past Surgical History:  Procedure Laterality Date  . ABDOMINAL AORTAGRAM N/A 03/06/2014   Procedure: ABDOMINAL AORTAGRAM;  Surgeon: Rosetta Posner, MD;  Location: [facility] CATH LAB;  Service: Cardiovascular;  Laterality: N/A;  . ABDOMINAL AORTIC ANEURYSM REPAIR  2009   stenting  . ABDOMINAL HYSTERECTOMY     Total, 1992  . BREAST BIOPSY    . CARDIAC CATHETERIZATION    . CARDIOVASCULAR STRESS TEST  11/11/2009   normal study  . CARPAL TUNNEL RELEASE  08/1993   left wrist  . CARPAL TUNNEL RELEASE  01/1997   right  . CATARACT EXTRACTION, BILATERAL  2021  . CORONARY ARTERY BYPASS GRAFT  07/1994   Triple by Dr Ceasar Mons  . dental implants    . EYE SURGERY  03/2020   cataract  . HAND SURGERY Right 09/2002   Right hand pulley release  . LEFT HEART CATHETERIZATION WITH CORONARY/GRAFT ANGIOGRAM N/A 06/11/2013   Procedure: LEFT HEART CATHETERIZATION WITH [name];  Surgeon: Thayer Headings, MD;  Location: [facility] CATH LAB;  Service: Cardiovascular;  Laterality: N/A;  . MULTIPLE TOOTH EXTRACTIONS  2000   All upper teeth.  Has full dneture.   [name] RENAL ARTERY STENT  2008  . SHOULDER ARTHROSCOPY WITH ROTATOR CUFF REPAIR Right 09/27/2018   Procedure: Right shoulder mini open rotator cuff repair;  Surgeon: Susa Day, MD;  Location: [facility] ORS;  Service: Orthopedics;  Laterality: Right;  90 mins  . thyroid cyst apiration  05/1997 and 07/1998  . TONSILECTOMY, ADENOIDECTOMY, BILATERAL MYRINGOTOMY AND TUBES  1989  . TONSILLECTOMY    . TOTAL ABDOMINAL HYSTERECTOMY  1992    Short  Social History:  Social History   Tobacco Use  . Smoking status: Former Smoker    Packs/day: 0.00    Years: 37.00    Pack years: 0.00    Types: Cigarettes    Quit date: 12/07/1995    Years since quitting: 24.8  . Smokeless tobacco: Never Used  Substance Use Topics  . Alcohol use: Yes    Alcohol/week: 6.0 - 10.0 standard drinks    Types: 6 - 10 Glasses of wine per week    Comment: 2-3 glasses daily    Allergies  Allergen Reactions  . Anticoagulant Compound Itching and Other (See Comments)    Hands and feet tingle and itch.   . Morphine And Related Nausea Only and Itching  . Bextra [Valdecoxib] Other (See Comments)    Increased lft's Increased lft's  . Hydrocod Polst-Cpm Polst Er Rash  . Lipitor [Atorvastatin] Other (See Comments)    Leg weakness   . Mevacor [Lovastatin] Other (See Comments)    Muscle weakness   . Plavix  [Clopidogrel Bisulfate] Itching and Other (See Comments)    Hands and feet tingle and itch.  . Zocor [Simvastatin] Other (See Comments)    Bones hurt   . Doxycycline Diarrhea    N/v/d  . Metoprolol Other (See Comments)    Hair loss  . Plavix [Clopidogrel Bisulfate] Other (See Comments)    Itching of hands and feet  . Codeine Itching and Rash    Hyper-active  . Hydrocod Polst-Cpm Polst Er Rash  . Lisinopril Cough    Current Outpatient Medications  Medication Sig Dispense Refill  . Accu-Chek FastClix Lancets MISC Check blood sugar once daily PRN 100 each 1  . albuterol (PROVENTIL HFA;VENTOLIN HFA) 108 (90 Base) MCG/ACT inhaler Inhale 2 puffs into the lungs every 4 (four) hours as needed for wheezing or shortness of breath. 1 Inhaler 2  . ALPRAZolam (XANAX) 0.25 MG tablet Take 1 tablet (0.25 mg total) by mouth at bedtime as needed for sleep. 90 tablet 3  . amLODipine (NORVASC) 5 MG tablet TAKE 1 TABLET BY MOUTH EVERY DAY 90 tablet 2  . aspirin EC 81 MG tablet Take 1 tablet (81 mg total) by mouth daily. (Patient taking differently: Take 325 mg by  mouth daily. ) 90 tablet 3  . atorvastatin (LIPITOR) 80 MG tablet TAKE 1 TABLET BY MOUTH EVERY DAY 90 tablet 3  . augmented betamethasone  dipropionate (DIPROLENE-AF) 0.05 % cream Apply 1 application topically daily as needed (rash). For hands  1  . B Complex-Biotin-FA (B-100 COMPLEX PO) Take 1 tablet by mouth at bedtime.     . Budeson-Glycopyrrol-Formoterol (BREZTRI AEROSPHERE) 160-9-4.8 MCG/ACT AERO Inhale 2 puffs into the lungs in the morning and at bedtime. 10.7 g 5  . ciclopirox (LOPROX) 0.77 % cream Apply 1 application topically daily as needed (hands fungus).     . clobetasol cream (TEMOVATE) 0.76 % Apply 1 application topically 2 (two) times daily as needed (hands).     . CORDRAN 4 MCG/SQCM TAPE Apply 1 each topically daily as needed (Rash).     . cycloSPORINE (RESTASIS) 0.05 % ophthalmic emulsion Place 1 drop into both eyes 2 (two) times daily.     [name] doxycycline (VIBRA-TABS) 100 MG tablet Take 1 tablet (100 mg total) by mouth 2 (two) times daily. 10 tablet 0  . gabapentin (NEURONTIN) 300 MG capsule Take 1-2 capsules (300-600 mg total) by mouth 3 (three) times daily. (Patient taking differently: Take 300 mg by mouth 3 (three) times daily. ) 540 capsule 4  . glucose blood (ACCU-CHEK SMARTVIEW) test strip CHECK BLOOD SUGAR ONCE DAILY AS NEEDED 100 strip 1  . hydrALAZINE (APRESOLINE) 10 MG tablet TAKE 1 TABLET BY MOUTH THREE TIMES A DAY (Patient taking differently: Take 10 mg by mouth 3 (three) times daily. ) 270 tablet 3  . losartan-hydrochlorothiazide (HYZAAR) 100-25 MG tablet Take 1 tablet by mouth daily. 90 tablet 3  . methocarbamol (ROBAXIN) 500 MG tablet Take 1 tablet (500 mg total) by mouth 4 (four) times daily. 35 tablet 0  . Multiple Vitamins-Minerals (MULTIVITAMIN WITH MINERALS) tablet Take 1 tablet by mouth at bedtime.    . nitroGLYCERIN (NITROSTAT) 0.4 MG SL tablet Place 1 tablet (0.4 mg total) under the tongue every 5 (five) minutes as needed for chest pain. 25 tablet 11  .  omeprazole (PRILOSEC) 40 MG capsule TAKE 1 CAPSULE BY MOUTH TWICE A DAY (Patient taking differently: Take 40 mg by mouth 2 (two) times daily. ) 60 capsule 11  . oxyCODONE-acetaminophen (PERCOCET) 10-325 MG tablet Take 1 tablet by mouth every 6 (six) hours as needed for pain. 20 tablet 0  . potassium chloride (KLOR-CON) 10 MEQ tablet TAKE 2 TABLETS (20 MEQ TOTAL) BY MOUTH 2 (TWO) TIMES DAILY. 360 tablet 3  . torsemide (DEMADEX) 20 MG tablet Take 1/2 tablet daily (Patient taking differently: Take 10 mg by mouth every other day. ) 45 tablet 3  . tretinoin (RETIN-A) 0.1 % cream Apply 1 application topically every other day. In the evening    . VITAMIN D, ERGOCALCIFEROL, PO Take 1,000 Units by mouth every evening.      No current facility-administered medications for this visit.    Review of Systems  Constitutional:  Constitutional negative. HENT: HENT negative.  Eyes: Eyes negative.  Respiratory: Respiratory negative.  Cardiovascular: Cardiovascular negative.  GI: Gastrointestinal negative.  Musculoskeletal: Musculoskeletal negative.  Skin: Skin negative.  Neurological: Neurological negative. Hematologic: Hematologic/lymphatic negative.  Psychiatric: Psychiatric negative.        Objective:  Objective   Vitals:   09/26/20 1010  BP: 113/71  Pulse: 73  Resp: 20  Temp: (!) 97.2 F (36.2 C)  SpO2: (!) 73%  Weight: 134 lb (60.8 kg)  Height: 5' (1.524 m)   Body mass index is 26.17 kg/m.  Physical Exam HENT:     Head: Normocephalic.     Nose:     Comments: Wearing  a mask Eyes:     Pupils: Pupils are equal, round, and reactive to light.  Cardiovascular:     Pulses: Normal pulses.  Pulmonary:     Effort: Pulmonary effort is normal.  Abdominal:     General: Abdomen is flat.     Palpations: Abdomen is soft. There is no mass.  Musculoskeletal:        General: No swelling. Normal range of motion.     Cervical back: Normal range of motion and neck supple.  Skin:    General:  Skin is warm and dry.     Capillary Refill: Capillary refill takes less than 2 seconds.  Neurological:     General: No focal deficit present.     Mental Status: She is alert.  Psychiatric:        Mood and Affect: Mood normal.        Behavior: Behavior normal.        Thought Content: Thought content normal.        Judgment: Judgment normal.     Data:. CT IMPRESSION: 1. Stable and normal patency of aortic endograft patency and positioning without evidence of endoleak. Aneurysm sac size is stable and measures approximately 4.0 x 4.1 cm. 2. Stable proximal SMA trunk stenosis reaching maximal caliber of approximately 60-70%. 3. Stable and normal patency of additional stent overlapping the distal left common iliac limb and extending just into the proximal left external iliac artery. 4. 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     Assessment/Plan:     74 year old female with previous abdominal aortic endograft and as noted above. We reviewed her CT scan today. I have offered to follow her in 1 year with duplex but she would rather have CT scan given the fact that she has a stent inside of her endograft and renal artery stent this is certainly reasonable. I will follow her up in 1 year. We can certainly move to duplex if there are no changes at that time.     Waynetta Sandy MD [facility]

## 2020-09-30 ENCOUNTER — Other Ambulatory Visit: Payer: Self-pay

## 2020-09-30 ENCOUNTER — Ambulatory Visit
Admission: RE | Admit: 2020-09-30 | Discharge: 2020-09-30 | Disposition: A | Discharge status: Home / Self Care | Payer: Medicare Other | Source: Ambulatory Visit | Attending: Neurological Surgery | Admitting: Neurological Surgery

## 2020-09-30 DIAGNOSIS — R2681 Unsteadiness on feet: Secondary | ICD-10-CM

## 2020-09-30 DIAGNOSIS — M4802 Spinal stenosis, cervical region: Secondary | ICD-10-CM | POA: Diagnosis not present

## 2020-09-30 DIAGNOSIS — D170 Benign lipomatous neoplasm of skin and subcutaneous tissue of head, face and neck: Secondary | ICD-10-CM | POA: Diagnosis not present

## 2020-09-30 DIAGNOSIS — G9389 Other specified disorders of brain: Secondary | ICD-10-CM | POA: Diagnosis not present

## 2020-09-30 DIAGNOSIS — D1779 Benign lipomatous neoplasm of other sites: Secondary | ICD-10-CM | POA: Diagnosis not present

## 2020-09-30 DIAGNOSIS — R2689 Other abnormalities of gait and mobility: Secondary | ICD-10-CM | POA: Diagnosis not present

## 2020-10-02 DIAGNOSIS — I10 Essential (primary) hypertension: Secondary | ICD-10-CM | POA: Diagnosis not present

## 2020-10-02 DIAGNOSIS — S32030A Wedge compression fracture of third lumbar vertebra, initial encounter for closed fracture: Secondary | ICD-10-CM | POA: Diagnosis not present

## 2020-10-03 ENCOUNTER — Other Ambulatory Visit: Payer: Self-pay

## 2020-10-03 ENCOUNTER — Ambulatory Visit: Payer: Medicare Other

## 2020-10-03 DIAGNOSIS — M545 Low back pain, unspecified: Secondary | ICD-10-CM

## 2020-10-03 DIAGNOSIS — G8929 Other chronic pain: Secondary | ICD-10-CM

## 2020-10-03 DIAGNOSIS — M6281 Muscle weakness (generalized): Secondary | ICD-10-CM | POA: Diagnosis not present

## 2020-10-03 DIAGNOSIS — R2681 Unsteadiness on feet: Secondary | ICD-10-CM

## 2020-10-03 DIAGNOSIS — I69351 Hemiplegia and hemiparesis following cerebral infarction affecting right dominant side: Secondary | ICD-10-CM | POA: Diagnosis not present

## 2020-10-03 DIAGNOSIS — R2689 Other abnormalities of gait and mobility: Secondary | ICD-10-CM

## 2020-10-03 NOTE — Therapy (Signed)
[facility] [address] [place], [place], [address] Phone: [phone]   Fax:  [phone]  Physical Therapy Treatment  Patient Details  Name: Megan [name] MRN: [id number] Date of Birth: 1946-05-05 Referring Provider (PT): Glenford Peers, NP   Encounter Date: 10/03/2020   PT End of Session - 10/03/20 1406    Visit Number 9    Number of Visits 17    Date for PT Re-Evaluation 10/16/20    Authorization Type Medicare/Medicaid    Progress Note Due on Visit 10    PT Start Time 1400    PT Stop Time 1445    PT Time Calculation (min) 45 min    Equipment Utilized During Treatment Gait belt;Back brace    Activity Tolerance Patient tolerated treatment well    Behavior During Therapy [facility] for tasks assessed/performed           Past Medical History:  Diagnosis Date  . AAA (abdominal aortic aneurysm) ([place])    a. s/p stent grafting in 2009.  . Adenomatous colon polyp 05/2001  . Adenomatous duodenal polyp   . Allergy   . Anginal pain ([place])   . COPD (chronic obstructive pulmonary disease) ([place])    pt denies COPD  . Coronary artery disease    a. s/p CABG in 1995;  b. 05/2013 Neg MV, EF 82%;  c. 06/2013 Cath: LM nl, LAD 40-50p, LCX nl, RCA 80p/110m, VG->RCA->PDA->PLSA min irregs, LIMA->LAD atretic, vigorous LV fxn.  . Diabetes mellitus without complication (HCC)    history of, resolved. 2017  . Diverticulosis   . Eczema, dyshidrotic   . Eczema, dyshidrotic 1986   Dr. Mathis Fare  . Emphysema of lung ([place])   . Fatty liver   . Fibromyalgia   . GERD (gastroesophageal reflux disease)   . Hyperlipidemia   . Hypertension   . Migraine   . Myocardial infarction ([place])   . Osteoarthritis   . Osteoporosis    unsure, having a bone scan soon  . Peripheral arterial disease (HCC)    left leg diagnosed by Dr Mare Ferrari  . Renal artery stenosis ([place])    a. 2008 s/p PTA    Past Surgical History:  Procedure Laterality Date   . ABDOMINAL AORTAGRAM N/A 03/06/2014   Procedure: ABDOMINAL AORTAGRAM;  Surgeon: Rosetta Posner, MD;  Location: [facility] CATH LAB;  Service: Cardiovascular;  Laterality: N/A;  . ABDOMINAL AORTIC ANEURYSM REPAIR  2009   stenting  . ABDOMINAL HYSTERECTOMY     Total, 1992  . BREAST BIOPSY    . CARDIAC CATHETERIZATION    . CARDIOVASCULAR STRESS TEST  11/11/2009   normal study  . CARPAL TUNNEL RELEASE  08/1993   left wrist  . CARPAL TUNNEL RELEASE  01/1997   right  . CATARACT EXTRACTION, BILATERAL  2021  . CORONARY ARTERY BYPASS GRAFT  07/1994   Triple by Dr Ceasar Mons  . dental implants    . EYE SURGERY  03/2020   cataract  . HAND SURGERY Right 09/2002   Right hand pulley release  . LEFT HEART CATHETERIZATION WITH CORONARY/GRAFT ANGIOGRAM N/A 06/11/2013   Procedure: LEFT HEART CATHETERIZATION WITH [name];  Surgeon: Thayer Headings, MD;  Location: [facility] CATH LAB;  Service: Cardiovascular;  Laterality: N/A;  . MULTIPLE TOOTH EXTRACTIONS  2000   All upper teeth.  Has full dneture.   [name] RENAL ARTERY STENT  2008  . SHOULDER ARTHROSCOPY WITH ROTATOR CUFF REPAIR Right 09/27/2018   Procedure: Right  shoulder mini open rotator cuff repair;  Surgeon: Susa Day, MD;  Location: [facility] ORS;  Service: Orthopedics;  Laterality: Right;  90 mins  . thyroid cyst apiration  05/1997 and 07/1998  . TONSILECTOMY, ADENOIDECTOMY, BILATERAL MYRINGOTOMY AND TUBES  1989  . TONSILLECTOMY    . TOTAL ABDOMINAL HYSTERECTOMY  1992    There were no vitals filed for this visit.   Subjective Assessment - 10/03/20 1403    Subjective Pt reports she had couple of bad days of pain.    Pertinent History AAA, angina, COPD, CAD s/p CABG, DM, diverticulosis, fibromyalgia, HTN, migraine, MI, OA, PAD, Renal artery stenosis. back pain    Limitations Standing;Lifting;Walking;House hold activities    How long can you sit comfortably? no limits    How long can you stand comfortably? 5-10 min    How long can you walk  comfortably? 5-10 min    Diagnostic tests MRI: L3 compression fracture per pt report    Pain Score 6     Pain Location Back    Pain Orientation Right    Pain Descriptors / Indicators Aching;Tightness    Pain Onset More than a month ago              Following exercises done with lumbar brace on.   Single knee to chest: 10 x 10" holds Manually stretched hamstrings: 3 x 30" R and L Figure 4 stretch: pushing knee away: 10 x 10" holds Lower trunk rotations: 20x SL hip abdution: 2 x 10 Bridge 2 x 10  Walking fwd and bwd EO: 3 x 10 feet no AD Walking fwd and  Bwd: EC: 3 x 10 feet no AD Walking tandem on blue foam balance beam: 2x SLS on floor: 3 x 10" R and L Walking outside: 1 x 1020' no AD, pt reported 3/4 of the way that her hips were starting to get tired and sore and legs were started to feel heavy.                       PT Short Term Goals - 09/19/20 1325      PT SHORT TERM GOAL #1   Title Patient will be compliant with walking 10 min for 4x/week to initiate walking program    Baseline unable to walk 1/4 mile    Time 4    Period Weeks    Status Achieved    Target Date 09/18/20      PT SHORT TERM GOAL #2   Title Patient will report <5/10 pain in her back with ADLs to improve function at home    Baseline 7/10 constant (eval)    Time 4    Period Weeks    Status Achieved    Target Date 09/18/20             PT Long Term Goals - 09/19/20 1355      PT LONG TERM GOAL #1   Title Patient will demo 22/24 on DGI to improve balance with ambulation    Baseline 16/24 (eval)    Time 8    Period Weeks    Status New      PT LONG TERM GOAL #2   Title Patient will demo 5x sit to stand without HHA under 17 seconds to improve functional strength    Baseline 25 seconds no HHA; 14 seconds 09/19/20    Time 8    Period Weeks    Status Achieved  PT LONG TERM GOAL #3   Title Patient will report <3/10 pain in her loewr back with functional activities  to improve overall function    Baseline 7/10 constant pain (eval); 5/10 pm 09/19/20    Time 8    Period Weeks    Status New                 Plan - 10/03/20 1406    Clinical Impression Statement We emphasized improving flexibility, hip strength and core strength with exericses. After exercises pain reduce 4/10.    Personal Factors and Comorbidities Comorbidity 3+    Comorbidities AAA, angina, COPD, CAD s/p CABG, DM, diverticulosis, fibromyalgia, HTN, migraine, MI, OA, PAD, Renal artery stenosis. back pain    Examination-Activity Limitations Bathing;Bend;Caring for Others;Carry;Lift;Stairs;Stand;Squat;Sleep;Transfers    Examination-Participation Restrictions Cleaning;Community Activity;Driving;Laundry;Yard Work;Shop;Meal Prep    Stability/Clinical Decision Making Evolving/Moderate complexity    Rehab Potential Good    PT Frequency 2x / week    PT Duration 8 weeks    PT Treatment/Interventions ADLs/Self Care Home Management;Moist Heat;Neuromuscular re-education;Balance training;Therapeutic exercise;Therapeutic activities;Functional mobility training;Stair training;Gait training;Patient/family education;Manual techniques;Scar mobilization;Passive range of motion;Energy conservation;Joint Manipulations;Spinal Manipulations;Vestibular    PT Next Visit Plan PN next session. Continue to perform core and hip strengthening as pt is able to tolerate. Progress static & dynamic balance. Manual therapy if indicated.    PT Home Exercise Plan Access Code [email or web address]; [email or web address]    Consulted and Agree with Plan of Care Patient           Patient will benefit from skilled therapeutic intervention in order to improve the following deficits and impairments:  Abnormal gait, Decreased activity tolerance, Decreased balance, Decreased mobility, Decreased endurance, Decreased range of motion, Decreased strength, Hypomobility, Dizziness, Difficulty walking, Impaired flexibility, Postural dysfunction, Pain  Visit  Diagnosis: Muscle weakness (generalized)  Other abnormalities of gait and mobility  Unsteadiness on feet  Chronic bilateral low back pain without sciatica     Problem List Patient Active Problem List   Diagnosis Date Noted  . S/P lumbar fusion 06/18/2020  . COPD with chronic bronchitis ([place]) 09/28/2018  . Essential hypertension 07/10/2018  . IFG (impaired fasting glucose) 11/10/2017  . DJD (degenerative joint disease), lumbosacral 11/10/2017  . Fibromyalgia 11/10/2017  . Dyshidrotic eczema 11/10/2017  . GERD (gastroesophageal reflux disease) 11/10/2017  . Osteopenia 11/10/2017  . Does use hearing aid 11/10/2017  . 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  . Fatty liver 11/10/2017  . Coronary artery disease   . Peripheral vascular disease with claudication ([place])   . 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  . History of abdominal aortic aneurysm repair 05/18/2011  . History of renal artery stenosis 05/18/2011  . Mixed hyperlipidemia     Kerrie Pleasure 10/03/2020, 3:19 PM  [facility] [address] [place], [place], [address] Phone: [phone]   Fax:  [phone]  Name: Megan [name] MRN: [id number] Date of Birth: [birth year]

## 2020-10-06 ENCOUNTER — Other Ambulatory Visit: Payer: Self-pay | Admitting: Family Medicine

## 2020-10-06 DIAGNOSIS — Z1231 Encounter for screening mammogram for malignant neoplasm of breast: Secondary | ICD-10-CM

## 2020-10-07 ENCOUNTER — Other Ambulatory Visit: Payer: Self-pay

## 2020-10-07 ENCOUNTER — Ambulatory Visit: Payer: Medicare Other | Attending: Student | Admitting: Physical Therapy

## 2020-10-07 DIAGNOSIS — M545 Low back pain, unspecified: Secondary | ICD-10-CM

## 2020-10-07 DIAGNOSIS — M6281 Muscle weakness (generalized): Secondary | ICD-10-CM | POA: Diagnosis not present

## 2020-10-07 DIAGNOSIS — G8929 Other chronic pain: Secondary | ICD-10-CM | POA: Insufficient documentation

## 2020-10-07 DIAGNOSIS — R2689 Other abnormalities of gait and mobility: Secondary | ICD-10-CM

## 2020-10-07 DIAGNOSIS — R2681 Unsteadiness on feet: Secondary | ICD-10-CM | POA: Diagnosis not present

## 2020-10-07 NOTE — Therapy (Signed)
[facility] [address] [place], [place], [address] Phone: [phone]   Fax:  [phone]  Physical Therapy Treatment and 10th Visit Progress Note  Progress Note Reporting Period 08/21/2020 to 10/07/2020  See note below for Objective Data and Assessment of Progress/Goals.     Patient Details  Name: Megan [name] MRN: [id number] Date of Birth: [birth year] Referring Provider (PT): Glenford Peers, NP   Encounter Date: 10/07/2020   PT End of Session - 10/07/20 1342    Visit Number 10    Number of Visits 17    Date for PT Re-Evaluation 10/16/20    Authorization Type Medicare/Medicaid    Progress Note Due on Visit 10    PT Start Time 1250    PT Stop Time 1330    PT Time Calculation (min) 40 min    Equipment Utilized During Treatment Back brace    Activity Tolerance Patient tolerated treatment well    Behavior During Therapy [facility] for tasks assessed/performed           Past Medical History:  Diagnosis Date  . AAA (abdominal aortic aneurysm) ([place])    a. s/p stent grafting in 2009.  . Adenomatous colon polyp 05/2001  . Adenomatous duodenal polyp   . Allergy   . Anginal pain ([place])   . COPD (chronic obstructive pulmonary disease) ([place])    pt denies COPD  . Coronary artery disease    a. s/p CABG in 1995;  b. 05/2013 Neg MV, EF 82%;  c. 06/2013 Cath: LM nl, LAD 40-50p, LCX nl, RCA 80p/145m, VG->RCA->PDA->PLSA min irregs, LIMA->LAD atretic, vigorous LV fxn.  . Diabetes mellitus without complication (HCC)    history of, resolved. 2017  . Diverticulosis   . Eczema, dyshidrotic   . Eczema, dyshidrotic 1986   Dr. Mathis Fare  . Emphysema of lung ([place])   . Fatty liver   . Fibromyalgia   . GERD (gastroesophageal reflux disease)   . Hyperlipidemia   . Hypertension   . Migraine   . Myocardial infarction ([place])   . Osteoarthritis   . Osteoporosis    unsure, having a bone scan soon  . Peripheral arterial disease  (HCC)    left leg diagnosed by Dr Mare Ferrari  . Renal artery stenosis ([place])    a. 2008 s/p PTA    Past Surgical History:  Procedure Laterality Date  . ABDOMINAL AORTAGRAM N/A 03/06/2014   Procedure: ABDOMINAL AORTAGRAM;  Surgeon: Rosetta Posner, MD;  Location: [facility] CATH LAB;  Service: Cardiovascular;  Laterality: N/A;  . ABDOMINAL AORTIC ANEURYSM REPAIR  2009   stenting  . ABDOMINAL HYSTERECTOMY     Total, 1992  . BREAST BIOPSY    . CARDIAC CATHETERIZATION    . CARDIOVASCULAR STRESS TEST  11/11/2009   normal study  . CARPAL TUNNEL RELEASE  08/1993   left wrist  . CARPAL TUNNEL RELEASE  01/1997   right  . CATARACT EXTRACTION, BILATERAL  2021  . CORONARY ARTERY BYPASS GRAFT  07/1994   Triple by Dr Ceasar Mons  . dental implants    . EYE SURGERY  03/2020   cataract  . HAND SURGERY Right 09/2002   Right hand pulley release  . LEFT HEART CATHETERIZATION WITH CORONARY/GRAFT ANGIOGRAM N/A 06/11/2013   Procedure: LEFT HEART CATHETERIZATION WITH [name];  Surgeon: Thayer Headings, MD;  Location: [facility] CATH LAB;  Service: Cardiovascular;  Laterality: N/A;  . MULTIPLE TOOTH EXTRACTIONS  2000   All upper teeth.  Has full dneture.   [name] RENAL ARTERY STENT  2008  . SHOULDER ARTHROSCOPY WITH ROTATOR CUFF REPAIR Right 09/27/2018   Procedure: Right shoulder mini open rotator cuff repair;  Surgeon: Susa Day, MD;  Location: [facility] ORS;  Service: Orthopedics;  Laterality: Right;  90 mins  . thyroid cyst apiration  05/1997 and 07/1998  . TONSILECTOMY, ADENOIDECTOMY, BILATERAL MYRINGOTOMY AND TUBES  1989  . TONSILLECTOMY    . TOTAL ABDOMINAL HYSTERECTOMY  1992    There were no vitals filed for this visit.   Subjective Assessment - 10/07/20 1235    Subjective Pt states she's had to take oxy due to her pain. 7/10 at worst when she got out of bed; current pain is manageable. Pt states she does not feel that the current balance program is providing her much benefit. She states she does not feel  "off balance" just that things kind of shifts and feels off kilter with body/head turning or if she steps on a bump.    Pertinent History AAA, angina, COPD, CAD s/p CABG, DM, diverticulosis, fibromyalgia, HTN, migraine, MI, OA, PAD, Renal artery stenosis. back pain    Limitations Standing;Lifting;Walking;House hold activities    How long can you sit comfortably? no limits    How long can you stand comfortably? 5-10 min    How long can you walk comfortably? 5-10 min    Diagnostic tests MRI: L3 compression fracture per pt report    Currently in Pain? Yes    Pain Score 5     Pain Location Back    Pain Onset More than a month ago                   Vestibular Assessment - 10/07/20 0001      Symptom Behavior   Subjective history of current problem Walking and feels a bump or lift; or when she turns/shifts    Type of Dizziness  Unsteady with head/body turns    Frequency of Dizziness Intermittent    Duration of Dizziness Short periods after turns/shifts or when walking and feels a bump    Symptom Nature Motion provoked    Aggravating Factors Turning head quickly;Turning body quickly    Relieving Factors Slow movements    History of similar episodes Reports BPPV in past      Oculomotor Exam   Oculomotor Alignment Normal    Spontaneous Absent    Gaze-induced  Absent    Smooth Pursuits Comment   L beating nystagmus with R gaze; dizziness with gaze to R   Saccades Slow   Dizziness with up/down   Comment HIT to R: Corrective saccades; L HIT: WFL      Oculomotor Exam-Fixation Suppressed    Right Head Impulse --      Vestibulo-Ocular Reflex   VOR 1 Head Only (x 1 viewing) WFL    VOR 2 Head and Object (x 2 viewing) Increased symptoms    VOR to Slow Head Movement Normal    VOR Cancellation Normal             On foam:  VOR x 1 horizontal and vertical 1 min x 2 sets each  VOR x 2 horizontal and vertical 1 min x 2 sets each (mild LOB with horizontal)  Smooth pursuit pt  looking at note card with numbers horizontal and vertical 1 min each  Saccades pt looking at two note cards with numbers horizontal and vertical 1 min each (increased dizziness with vertical)  PT Education - 10/07/20 1334    Education Details Discussed possibly greater vestibular component to her balance based on her subjective; discussed trial of increasing vestibular based exercises to see if any added benefit. Discussed slowing down her strengthening exercises since she should be feeling it in her hips and not her back.    Person(s) Educated Patient    Methods Explanation;Demonstration;Verbal cues;Handout    Comprehension Verbalized understanding;Verbal cues required;Tactile cues required            PT Short Term Goals - 09/19/20 1325      PT SHORT TERM GOAL #1   Title Patient will be compliant with walking 10 min for 4x/week to initiate walking program    Baseline unable to walk 1/4 mile    Time 4    Period Weeks    Status Achieved    Target Date 09/18/20      PT SHORT TERM GOAL #2   Title Patient will report <5/10 pain in her back with ADLs to improve function at home    Baseline 7/10 constant (eval)    Time 4    Period Weeks    Status Achieved    Target Date 09/18/20             PT Long Term Goals - 10/07/20 1341      PT LONG TERM GOAL #1   Title Patient will demo 22/24 on DGI to improve balance with ambulation    Baseline 16/24 (eval)    Time 8    Period Weeks    Status On-going      PT LONG TERM GOAL #2   Title Patient will demo 5x sit to stand without HHA under 17 seconds to improve functional strength    Baseline 25 seconds no HHA; 14 seconds 09/19/20    Time 8    Period Weeks    Status Achieved      PT LONG TERM GOAL #3   Title Patient will report <3/10 pain in her loewr back with functional activities to improve overall function    Baseline 7/10 constant pain (eval); 5/10 pm 09/19/20; remains 5/10 to 8/10 throughout the day (10/07/20)     Time 8    Period Weeks    Status On-going                 Plan - 10/07/20 1335    Clinical Impression Statement Treatment session focused on providing 10th visit progress note and addressing pt's concerns that she has not felt any major benefits from her balance exercises as of yet. Provided pt with brief vestibular screen as her complaints of feeling off balance with increased movement/shifting with head turns and body turns seem more consistent with a vestibular component. 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 Treatment session thus focused on including more vestibular type balance exercises into her HEP. STGs have been achieved. LTG #2 has been achieved; #1 and #3 remain ongoing.    Personal Factors and Comorbidities Comorbidity 3+    Comorbidities AAA, angina, COPD, CAD s/p CABG, DM, diverticulosis, fibromyalgia, HTN, migraine, MI, OA, PAD, Renal artery stenosis. back pain    Examination-Activity Limitations Bathing;Bend;Caring for Others;Carry;Lift;Stairs;Stand;Squat;Sleep;Transfers    Examination-Participation Restrictions Cleaning;Community Activity;Driving;Laundry;Yard Work;Shop;Meal Prep    Stability/Clinical Decision Making Evolving/Moderate complexity    Rehab Potential Good    PT Frequency 2x / week    PT Duration 8 weeks    PT Treatment/Interventions ADLs/Self Care Home  Management;Moist Heat;Neuromuscular re-education;Balance training;Therapeutic exercise;Therapeutic activities;Functional mobility training;Stair training;Gait training;Patient/family education;Manual techniques;Scar mobilization;Passive range of motion;Energy conservation;Joint Manipulations;Spinal Manipulations;Vestibular    PT Next Visit Plan How are the vestibular exercises? Continue to perform core and hip strengthening as pt is able to tolerate. Progress dynamic balance/vestibular exercises. Manual therapy if indicated.    PT Home Exercise Plan Access Code  [email or web address]    Consulted and Agree with Plan of Care Patient           Patient will benefit from skilled therapeutic intervention in order to improve the following deficits and impairments:  Abnormal gait, Decreased activity tolerance, Decreased balance, Decreased mobility, Decreased endurance, Decreased range of motion, Decreased strength, Hypomobility, Dizziness, Difficulty walking, Impaired flexibility, Postural dysfunction, Pain  Visit Diagnosis: Muscle weakness (generalized)  Other abnormalities of gait and mobility  Unsteadiness on feet  Chronic bilateral low back pain without sciatica     Problem List Patient Active Problem List   Diagnosis Date Noted  . S/P lumbar fusion 06/18/2020  . COPD with chronic bronchitis ([place]) 09/28/2018  . Essential hypertension 07/10/2018  . IFG (impaired fasting glucose) 11/10/2017  . DJD (degenerative joint disease), lumbosacral 11/10/2017  . Fibromyalgia 11/10/2017  . Dyshidrotic eczema 11/10/2017  . GERD (gastroesophageal reflux disease) 11/10/2017  . Osteopenia 11/10/2017  . Does use hearing aid 11/10/2017  . 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  . Fatty liver 11/10/2017  . Coronary artery disease   . Peripheral vascular disease with claudication ([place])   . 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  . History of abdominal aortic aneurysm repair 05/18/2011  . History of renal artery stenosis 05/18/2011  . Mixed hyperlipidemia     Cielo Arias April Ma L Kel Senn PT, DPT 10/07/2020, 1:44 PM  [facility] [address] [place], [place], [address] Phone: [phone]   Fax:  [phone]  Name: Megan [name] MRN: [id number] Date of Birth: [birth year]

## 2020-10-10 ENCOUNTER — Ambulatory Visit: Payer: Medicare Other

## 2020-10-10 ENCOUNTER — Other Ambulatory Visit: Payer: Self-pay

## 2020-10-10 DIAGNOSIS — M6281 Muscle weakness (generalized): Secondary | ICD-10-CM

## 2020-10-10 DIAGNOSIS — M545 Low back pain, unspecified: Secondary | ICD-10-CM | POA: Diagnosis not present

## 2020-10-10 DIAGNOSIS — R2681 Unsteadiness on feet: Secondary | ICD-10-CM | POA: Diagnosis not present

## 2020-10-10 DIAGNOSIS — G8929 Other chronic pain: Secondary | ICD-10-CM | POA: Diagnosis not present

## 2020-10-10 DIAGNOSIS — R2689 Other abnormalities of gait and mobility: Secondary | ICD-10-CM | POA: Diagnosis not present

## 2020-10-10 NOTE — Therapy (Signed)
[facility] [address] [place], [place], [address] Phone: [phone]   Fax:  [phone]  Physical Therapy Treatment  Patient Details  Name: Megan [name] MRN: [id number] Date of Birth: 09-26-1946 Referring Provider (PT): Glenford Peers, NP   Encounter Date: 10/10/2020   PT End of Session - 10/10/20 1347    Visit Number 11    Number of Visits 17    Date for PT Re-Evaluation 10/16/20    Authorization Type Medicare/Medicaid    Progress Note Due on Visit 10    PT Start Time 1240    PT Stop Time 1325    PT Time Calculation (min) 45 min    Equipment Utilized During Treatment Back brace    Activity Tolerance Patient tolerated treatment well    Behavior During Therapy [facility] for tasks assessed/performed           Past Medical History:  Diagnosis Date  . AAA (abdominal aortic aneurysm) ([place])    a. s/p stent grafting in 2009.  . Adenomatous colon polyp 05/2001  . Adenomatous duodenal polyp   . Allergy   . Anginal pain ([place])   . COPD (chronic obstructive pulmonary disease) ([place])    pt denies COPD  . Coronary artery disease    a. s/p CABG in 1995;  b. 05/2013 Neg MV, EF 82%;  c. 06/2013 Cath: LM nl, LAD 40-50p, LCX nl, RCA 80p/123m, VG->RCA->PDA->PLSA min irregs, LIMA->LAD atretic, vigorous LV fxn.  . Diabetes mellitus without complication (HCC)    history of, resolved. 2017  . Diverticulosis   . Eczema, dyshidrotic   . Eczema, dyshidrotic 1986   Dr. Mathis Fare  . Emphysema of lung ([place])   . Fatty liver   . Fibromyalgia   . GERD (gastroesophageal reflux disease)   . Hyperlipidemia   . Hypertension   . Migraine   . Myocardial infarction ([place])   . Osteoarthritis   . Osteoporosis    unsure, having a bone scan soon  . Peripheral arterial disease (HCC)    left leg diagnosed by Dr Mare Ferrari  . Renal artery stenosis ([place])    a. 2008 s/p PTA    Past Surgical History:  Procedure Laterality Date  .  ABDOMINAL AORTAGRAM N/A 03/06/2014   Procedure: ABDOMINAL AORTAGRAM;  Surgeon: Rosetta Posner, MD;  Location: [facility] CATH LAB;  Service: Cardiovascular;  Laterality: N/A;  . ABDOMINAL AORTIC ANEURYSM REPAIR  2009   stenting  . ABDOMINAL HYSTERECTOMY     Total, 1992  . BREAST BIOPSY    . CARDIAC CATHETERIZATION    . CARDIOVASCULAR STRESS TEST  11/11/2009   normal study  . CARPAL TUNNEL RELEASE  08/1993   left wrist  . CARPAL TUNNEL RELEASE  01/1997   right  . CATARACT EXTRACTION, BILATERAL  2021  . CORONARY ARTERY BYPASS GRAFT  07/1994   Triple by Dr Ceasar Mons  . dental implants    . EYE SURGERY  03/2020   cataract  . HAND SURGERY Right 09/2002   Right hand pulley release  . LEFT HEART CATHETERIZATION WITH CORONARY/GRAFT ANGIOGRAM N/A 06/11/2013   Procedure: LEFT HEART CATHETERIZATION WITH [name];  Surgeon: Thayer Headings, MD;  Location: [facility] LAB;  Service: Cardiovascular;  Laterality: N/A;  . MULTIPLE TOOTH EXTRACTIONS  2000   All upper teeth.  Has full dneture.   [name] RENAL ARTERY STENT  2008  . SHOULDER ARTHROSCOPY WITH ROTATOR CUFF REPAIR Right 09/27/2018   Procedure: Right shoulder  mini open rotator cuff repair;  Surgeon: Susa Day, MD;  Location: [facility] ORS;  Service: Orthopedics;  Laterality: Right;  90 mins  . thyroid cyst apiration  05/1997 and 07/1998  . TONSILECTOMY, ADENOIDECTOMY, BILATERAL MYRINGOTOMY AND TUBES  1989  . TONSILLECTOMY    . TOTAL ABDOMINAL HYSTERECTOMY  1992    There were no vitals filed for this visit.   Subjective Assessment - 10/10/20 1343    Subjective Pt reports 6/10 pain. Pt reports pain in morning is still unbearable and it is hard for her to stand up and walk initially. She is doing all the exercises but not seeing improvement in her pain. There are good days and bad days but pain is constant. She had to take pain pills last couple of days to get by.    Pertinent History AAA, angina, COPD, CAD s/p CABG, DM, diverticulosis, fibromyalgia,  HTN, migraine, MI, OA, PAD, Renal artery stenosis. back pain    Limitations Standing;Lifting;Walking;House hold activities    How long can you sit comfortably? no limits    How long can you stand comfortably? 5-10 min    How long can you walk comfortably? 5-10 min    Diagnostic tests MRI: L3 compression fracture per pt report    Currently in Pain? Yes    Pain Score 6     Pain Location Back    Pain Orientation Left;Right    Pain Onset More than a month ago                  Pain neuroscience education: - Educated pt on tissue sensitivity and how it increases due to prolonged pain in chronic pain. Pt educated on how emotional factors also affect tissue sensitivity. Tissue sensitivity is not constant and it can be altered with improving knowledge about pain, understanding physiologic limits by modifying activity level to make sure his pain doesn't increase from baseline with activities, improving sleeping habits, and by gradually promoting movement that doesn't elicit painful response. - Pt educated on focusing on movements that doesn't increase her pain from baseline and gradually increasing intensity and frequency of those movements to improve positive reinforcement Pt educated on benefits of proper sleeping. - pt educated on tissue healing time frames and at this point, there is most likely no futher tissue healing that will occur and her pain response is outside normal limits of tissue healing. This is also another reason for her altered tissue sensitivity  Seated trunk isometrics with ball: 10 x 5" holds, with pillow: 10 x 5" holds, pt reported strain in arms in back, no activation felt in stomach per patient Standing palloff press exercises: 2 x 10 R and L red band Standing trunk tiwsts: red band: 10x R and L Pt standing while being educated with above exercises and PNE                       PT Short Term Goals - 09/19/20 1325      PT SHORT TERM GOAL #1    Title Patient will be compliant with walking 10 min for 4x/week to initiate walking program    Baseline unable to walk 1/4 mile    Time 4    Period Weeks    Status Achieved    Target Date 09/18/20      PT SHORT TERM GOAL #2   Title Patient will report <5/10 pain in her back with ADLs to improve function at home  Baseline 7/10 constant (eval)    Time 4    Period Weeks    Status Achieved    Target Date 09/18/20             PT Long Term Goals - 10/07/20 1341      PT LONG TERM GOAL #1   Title Patient will demo 22/24 on DGI to improve balance with ambulation    Baseline 16/24 (eval)    Time 8    Period Weeks    Status On-going      PT LONG TERM GOAL #2   Title Patient will demo 5x sit to stand without HHA under 17 seconds to improve functional strength    Baseline 25 seconds no HHA; 14 seconds 09/19/20    Time 8    Period Weeks    Status Achieved      PT LONG TERM GOAL #3   Title Patient will report <3/10 pain in her loewr back with functional activities to improve overall function    Baseline 7/10 constant pain (eval); 5/10 pm 09/19/20; remains 5/10 to 8/10 throughout the day (10/07/20)    Time 8    Period Weeks    Status On-going                 Plan - 10/10/20 1345    Clinical Impression Statement today's session was focused on improving patients knowledge regarding pain neuroscience education and teaching her on resources to [facility] her pain by modifiying intensity, frequency of exercises and functional activities to improve pain management.    Personal Factors and Comorbidities Comorbidity 3+    Comorbidities AAA, angina, COPD, CAD s/p CABG, DM, diverticulosis, fibromyalgia, HTN, migraine, MI, OA, PAD, Renal artery stenosis. back pain    Examination-Activity Limitations Bathing;Bend;Caring for Others;Carry;Lift;Stairs;Stand;Squat;Sleep;Transfers    Examination-Participation Restrictions Cleaning;Community Activity;Driving;Laundry;Yard Work;Shop;Meal  Prep    Stability/Clinical Decision Making Evolving/Moderate complexity    Rehab Potential Good    PT Frequency 2x / week    PT Duration 8 weeks    PT Treatment/Interventions ADLs/Self Care Home Management;Moist Heat;Neuromuscular re-education;Balance training;Therapeutic exercise;Therapeutic activities;Functional mobility training;Stair training;Gait training;Patient/family education;Manual techniques;Scar mobilization;Passive range of motion;Energy conservation;Joint Manipulations;Spinal Manipulations;Vestibular    PT Next Visit Plan How are the vestibular exercises? Continue to perform core and hip strengthening as pt is able to tolerate. Progress dynamic balance/vestibular exercises. Manual therapy if indicated.    PT Home Exercise Plan Access Code [email or web address]; [email or web address]    Consulted and Agree with Plan of Care Patient           Patient will benefit from skilled therapeutic intervention in order to improve the following deficits and impairments:  Abnormal gait, Decreased activity tolerance, Decreased balance, Decreased mobility, Decreased endurance, Decreased range of motion, Decreased strength, Hypomobility, Dizziness, Difficulty walking, Impaired flexibility, Postural dysfunction, Pain  Visit Diagnosis: Muscle weakness (generalized)  Other abnormalities of gait and mobility  Unsteadiness on feet  Chronic bilateral low back pain without sciatica     Problem List Patient Active Problem List   Diagnosis Date Noted  . S/P lumbar fusion 06/18/2020  . COPD with chronic bronchitis ([place]) 09/28/2018  . Essential hypertension 07/10/2018  . IFG (impaired fasting glucose) 11/10/2017  . DJD (degenerative joint disease), lumbosacral 11/10/2017  . Fibromyalgia 11/10/2017  . Dyshidrotic eczema 11/10/2017  . GERD (gastroesophageal reflux disease) 11/10/2017  . Osteopenia 11/10/2017  . Does use hearing aid 11/10/2017  . 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  . Fatty liver 11/10/2017  . Coronary  artery disease   .  Peripheral vascular disease with claudication ([place])   . 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  . History of abdominal aortic aneurysm repair 05/18/2011  . History of renal artery stenosis 05/18/2011  . Mixed hyperlipidemia     Kerrie Pleasure, PT 10/10/2020, 1:51 PM  [facility] [address] [place], [place], [address] Phone: [phone]   Fax:  [phone]  Name: Meleane Selinger MRN: [id number] Date of Birth: 11-29-1946

## 2020-10-14 ENCOUNTER — Ambulatory Visit: Payer: Medicare Other

## 2020-10-21 ENCOUNTER — Other Ambulatory Visit: Payer: Self-pay

## 2020-10-21 ENCOUNTER — Ambulatory Visit: Payer: Medicare Other

## 2020-10-21 DIAGNOSIS — R2681 Unsteadiness on feet: Secondary | ICD-10-CM

## 2020-10-21 DIAGNOSIS — M6281 Muscle weakness (generalized): Secondary | ICD-10-CM

## 2020-10-21 DIAGNOSIS — M545 Low back pain, unspecified: Secondary | ICD-10-CM

## 2020-10-21 DIAGNOSIS — R2689 Other abnormalities of gait and mobility: Secondary | ICD-10-CM

## 2020-10-21 DIAGNOSIS — G8929 Other chronic pain: Secondary | ICD-10-CM | POA: Diagnosis not present

## 2020-10-21 NOTE — Therapy (Signed)
[facility] [address] [place], [place], [address] Phone: [phone]   Fax:  [phone]  Physical Therapy Treatment  Patient Details  Name: Megan [name] MRN: [id number] Date of Birth: 06-23-1946 Referring Provider (PT): Glenford Peers, NP   Encounter Date: 10/21/2020   PT End of Session - 10/21/20 1918    Visit Number 12    Number of Visits 17    Date for PT Re-Evaluation 10/16/20    Authorization Type Medicare/Medicaid    Progress Note Due on Visit 10    PT Start Time 1450    PT Stop Time 1530    PT Time Calculation (min) 40 min    Equipment Utilized During Treatment Back brace    Activity Tolerance Patient tolerated treatment well    Behavior During Therapy [facility] for tasks assessed/performed           Past Medical History:  Diagnosis Date  . AAA (abdominal aortic aneurysm) ([place])    a. s/p stent grafting in 2009.  . Adenomatous colon polyp 05/2001  . Adenomatous duodenal polyp   . Allergy   . Anginal pain ([place])   . COPD (chronic obstructive pulmonary disease) ([place])    pt denies COPD  . Coronary artery disease    a. s/p CABG in 1995;  b. 05/2013 Neg MV, EF 82%;  c. 06/2013 Cath: LM nl, LAD 40-50p, LCX nl, RCA 80p/126m, VG->RCA->PDA->PLSA min irregs, LIMA->LAD atretic, vigorous LV fxn.  . Diabetes mellitus without complication (HCC)    history of, resolved. 2017  . Diverticulosis   . Eczema, dyshidrotic   . Eczema, dyshidrotic 1986   Dr. Mathis Fare  . Emphysema of lung ([place])   . Fatty liver   . Fibromyalgia   . GERD (gastroesophageal reflux disease)   . Hyperlipidemia   . Hypertension   . Migraine   . Myocardial infarction ([place])   . Osteoarthritis   . Osteoporosis    unsure, having a bone scan soon  . Peripheral arterial disease (HCC)    left leg diagnosed by Dr Mare Ferrari  . Renal artery stenosis ([place])    a. 2008 s/p PTA    Past Surgical History:  Procedure Laterality Date  .  ABDOMINAL AORTAGRAM N/A 03/06/2014   Procedure: ABDOMINAL AORTAGRAM;  Surgeon: Rosetta Posner, MD;  Location: [facility] CATH LAB;  Service: Cardiovascular;  Laterality: N/A;  . ABDOMINAL AORTIC ANEURYSM REPAIR  2009   stenting  . ABDOMINAL HYSTERECTOMY     Total, 1992  . BREAST BIOPSY    . CARDIAC CATHETERIZATION    . CARDIOVASCULAR STRESS TEST  11/11/2009   normal study  . CARPAL TUNNEL RELEASE  08/1993   left wrist  . CARPAL TUNNEL RELEASE  01/1997   right  . CATARACT EXTRACTION, BILATERAL  2021  . CORONARY ARTERY BYPASS GRAFT  07/1994   Triple by Dr Ceasar Mons  . dental implants    . EYE SURGERY  03/2020   cataract  . HAND SURGERY Right 09/2002   Right hand pulley release  . LEFT HEART CATHETERIZATION WITH CORONARY/GRAFT ANGIOGRAM N/A 06/11/2013   Procedure: LEFT HEART CATHETERIZATION WITH [name];  Surgeon: Thayer Headings, MD;  Location: [facility] CATH LAB;  Service: Cardiovascular;  Laterality: N/A;  . MULTIPLE TOOTH EXTRACTIONS  2000   All upper teeth.  Has full dneture.   [name] RENAL ARTERY STENT  2008  . SHOULDER ARTHROSCOPY WITH ROTATOR CUFF REPAIR Right 09/27/2018   Procedure: Right shoulder  mini open rotator cuff repair;  Surgeon: Susa Day, MD;  Location: [facility] ORS;  Service: Orthopedics;  Laterality: Right;  90 mins  . thyroid cyst apiration  05/1997 and 07/1998  . TONSILECTOMY, ADENOIDECTOMY, BILATERAL MYRINGOTOMY AND TUBES  1989  . TONSILLECTOMY    . TOTAL ABDOMINAL HYSTERECTOMY  1992    There were no vitals filed for this visit.   Subjective Assessment - 10/21/20 1916    Subjective Pt reports she continues to struggle with pain    Pertinent History AAA, angina, COPD, CAD s/p CABG, DM, diverticulosis, fibromyalgia, HTN, migraine, MI, OA, PAD, Renal artery stenosis. back pain    Limitations Standing;Lifting;Walking;House hold activities    How long can you sit comfortably? no limits    How long can you stand comfortably? 5-10 min    How long can you walk comfortably?  5-10 min    Diagnostic tests MRI: L3 compression fracture per pt report    Pain Onset More than a month ago              Following education given to patient: - try not wearing brace for first 2 hours in the morning to improve flexibility in back. Do not attempt any heavy lifting, bending or twisting at this time. Perform activities like brushing, shower, getting ready, light meal etc. - put on brace for helping her husband get ready and showering him. - take a break after helping her husband before she makes bed. As helping her husband and making bed are most painful activities. Pt educated on separating activities and resting in between. - pt educated on modifying various activities throughout the day to make sure her base line pain doesn't increase - We discussed options of aquatic therapy but it is only benefiticial if she is able to continue on her own afterwards. Pt didn't want to pursue any further with aquatic PT - We discussed other conservative options of epidural or radiofrequency and consulting MD if needed.  Exercises: Plan on countertop with bird dog: 2 x 10, cues to engage spine musculature with lifts, cues to perform is lowly: 20x Lateral step down on bottom step: 10x R and L Above 2 exercises given as HEP                           PT Short Term Goals - 09/19/20 1325      PT SHORT TERM GOAL #1   Title Patient will be compliant with walking 10 min for 4x/week to initiate walking program    Baseline unable to walk 1/4 mile    Time 4    Period Weeks    Status Achieved    Target Date 09/18/20      PT SHORT TERM GOAL #2   Title Patient will report <5/10 pain in her back with ADLs to improve function at home    Baseline 7/10 constant (eval)    Time 4    Period Weeks    Status Achieved    Target Date 09/18/20             PT Long Term Goals - 10/07/20 1341      PT LONG TERM GOAL #1   Title Patient will demo 22/24 on DGI to improve  balance with ambulation    Baseline 16/24 (eval)    Time 8    Period Weeks    Status On-going      PT LONG TERM GOAL #2  Title Patient will demo 5x sit to stand without HHA under 17 seconds to improve functional strength    Baseline 25 seconds no HHA; 14 seconds 09/19/20    Time 8    Period Weeks    Status Achieved      PT LONG TERM GOAL #3   Title Patient will report <3/10 pain in her loewr back with functional activities to improve overall function    Baseline 7/10 constant pain (eval); 5/10 pm 09/19/20; remains 5/10 to 8/10 throughout the day (10/07/20)    Time 8    Period Weeks    Status On-going                 Plan - 10/21/20 1916    Clinical Impression Statement Today's session was focused on improving pain management strategies by teaching patient on improtance of modifying ADLs based on his pain to prevent pain from spiking high and resing her back in between to manage the pain. Pt needs further education on ways to modify ADLs to help her manage pain better.           Patient will benefit from skilled therapeutic intervention in order to improve the following deficits and impairments:     Visit Diagnosis: Muscle weakness (generalized)  Other abnormalities of gait and mobility  Unsteadiness on feet  Chronic bilateral low back pain without sciatica     Problem List Patient Active Problem List   Diagnosis Date Noted  . S/P lumbar fusion 06/18/2020  . COPD with chronic bronchitis ([place]) 09/28/2018  . Essential hypertension 07/10/2018  . IFG (impaired fasting glucose) 11/10/2017  . DJD (degenerative joint disease), lumbosacral 11/10/2017  . Fibromyalgia 11/10/2017  . Dyshidrotic eczema 11/10/2017  . GERD (gastroesophageal reflux disease) 11/10/2017  . Osteopenia 11/10/2017  . Does use hearing aid 11/10/2017  . 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  . Fatty liver 11/10/2017  . Coronary artery disease   . Peripheral vascular disease with claudication ([place])    . 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  . History of abdominal aortic aneurysm repair 05/18/2011  . History of renal artery stenosis 05/18/2011  . Mixed hyperlipidemia     Kerrie Pleasure 10/21/2020, 7:18 PM  [facility] [address] [place], [place], [address] Phone: [phone]   Fax:  [phone]  Name: Megan [name] MRN: [id number] Date of Birth: [birth year]

## 2020-10-28 ENCOUNTER — Ambulatory Visit: Payer: Medicare Other | Admitting: Physical Therapy

## 2020-10-28 ENCOUNTER — Other Ambulatory Visit: Payer: Self-pay

## 2020-10-28 DIAGNOSIS — M545 Low back pain, unspecified: Secondary | ICD-10-CM | POA: Diagnosis not present

## 2020-10-28 DIAGNOSIS — G8929 Other chronic pain: Secondary | ICD-10-CM

## 2020-10-28 DIAGNOSIS — M6281 Muscle weakness (generalized): Secondary | ICD-10-CM

## 2020-10-28 DIAGNOSIS — R2689 Other abnormalities of gait and mobility: Secondary | ICD-10-CM | POA: Diagnosis not present

## 2020-10-28 DIAGNOSIS — R2681 Unsteadiness on feet: Secondary | ICD-10-CM

## 2020-10-28 NOTE — Therapy (Signed)
[facility] [address] [place], [place], [address] Phone: [phone]   Fax:  [phone]  Physical Therapy Treatment  Patient Details  Name: Megan [name] MRN: [id number] Date of Birth: Sep 01, 1946 Referring Provider (PT): Glenford Peers, NP   Encounter Date: 10/28/2020   PT End of Session - 10/28/20 1150    Visit Number 13    Number of Visits 17    Date for PT Re-Evaluation 10/16/20    Authorization Type Medicare/Medicaid    Progress Note Due on Visit 10    PT Start Time 1105    PT Stop Time 1145    PT Time Calculation (min) 40 min    Equipment Utilized During Treatment Back brace    Activity Tolerance Patient tolerated treatment well    Behavior During Therapy [facility] for tasks assessed/performed           Past Medical History:  Diagnosis Date  . AAA (abdominal aortic aneurysm) ([place])    a. s/p stent grafting in 2009.  . Adenomatous colon polyp 05/2001  . Adenomatous duodenal polyp   . Allergy   . Anginal pain ([place])   . COPD (chronic obstructive pulmonary disease) ([place])    pt denies COPD  . Coronary artery disease    a. s/p CABG in 1995;  b. 05/2013 Neg MV, EF 82%;  c. 06/2013 Cath: LM nl, LAD 40-50p, LCX nl, RCA 80p/122m, VG->RCA->PDA->PLSA min irregs, LIMA->LAD atretic, vigorous LV fxn.  . Diabetes mellitus without complication (HCC)    history of, resolved. 2017  . Diverticulosis   . Eczema, dyshidrotic   . Eczema, dyshidrotic 1986   Dr. Mathis Fare  . Emphysema of lung ([place])   . Fatty liver   . Fibromyalgia   . GERD (gastroesophageal reflux disease)   . Hyperlipidemia   . Hypertension   . Migraine   . Myocardial infarction ([place])   . Osteoarthritis   . Osteoporosis    unsure, having a bone scan soon  . Peripheral arterial disease (HCC)    left leg diagnosed by Dr Mare Ferrari  . Renal artery stenosis ([place])    a. 2008 s/p PTA    Past Surgical History:  Procedure Laterality Date  .  ABDOMINAL AORTAGRAM N/A 03/06/2014   Procedure: ABDOMINAL AORTAGRAM;  Surgeon: Rosetta Posner, MD;  Location: [facility] CATH LAB;  Service: Cardiovascular;  Laterality: N/A;  . ABDOMINAL AORTIC ANEURYSM REPAIR  2009   stenting  . ABDOMINAL HYSTERECTOMY     Total, 1992  . BREAST BIOPSY    . CARDIAC CATHETERIZATION    . CARDIOVASCULAR STRESS TEST  11/11/2009   normal study  . CARPAL TUNNEL RELEASE  08/1993   left wrist  . CARPAL TUNNEL RELEASE  01/1997   right  . CATARACT EXTRACTION, BILATERAL  2021  . CORONARY ARTERY BYPASS GRAFT  07/1994   Triple by Dr Ceasar Mons  . dental implants    . EYE SURGERY  03/2020   cataract  . HAND SURGERY Right 09/2002   Right hand pulley release  . LEFT HEART CATHETERIZATION WITH CORONARY/GRAFT ANGIOGRAM N/A 06/11/2013   Procedure: LEFT HEART CATHETERIZATION WITH [name];  Surgeon: Thayer Headings, MD;  Location: [facility] CATH LAB;  Service: Cardiovascular;  Laterality: N/A;  . MULTIPLE TOOTH EXTRACTIONS  2000   All upper teeth.  Has full dneture.   [name] RENAL ARTERY STENT  2008  . SHOULDER ARTHROSCOPY WITH ROTATOR CUFF REPAIR Right 09/27/2018   Procedure: Right shoulder  mini open rotator cuff repair;  Surgeon: Susa Day, MD;  Location: [facility] ORS;  Service: Orthopedics;  Laterality: Right;  90 mins  . thyroid cyst apiration  05/1997 and 07/1998  . TONSILECTOMY, ADENOIDECTOMY, BILATERAL MYRINGOTOMY AND TUBES  1989  . TONSILLECTOMY    . TOTAL ABDOMINAL HYSTERECTOMY  1992    There were no vitals filed for this visit.   Subjective Assessment - 10/28/20 1109    Subjective Pt has been doing the 2 exercises -- been doing okay. Pt reports getting out of bed and feeling dizziness. Pt states she has been trying to be more cognizant about body mechanics.    Pertinent History AAA, angina, COPD, CAD s/p CABG, DM, diverticulosis, fibromyalgia, HTN, migraine, MI, OA, PAD, Renal artery stenosis. back pain    Limitations Standing;Lifting;Walking;House hold activities     How long can you sit comfortably? no limits    How long can you stand comfortably? 5-10 min    How long can you walk comfortably? 5-10 min    Diagnostic tests MRI: L3 compression fracture per pt report    Currently in Pain? Yes    Pain Onset More than a month ago             Dix-Hallpike x 3 for R ear: Trial 1 with 20 sec L upbeating nystagmus, trial 2 with 11 sec L upbeating nystagmus, trial 3 with 8 sec slower L upbeating nystagmus; pt with ~10 to 15 sec latency Epley maneuver performed x3 between each Dix-Hallpike  Standing:  Partial tandem VOR x 1 for 30 sec  Partial tandem eyes closed x30 sec  Feet together eyes closed with head turns x30 sec  Feet together eyes closed with head nods x 30 sec   Amb ~115' no a/d SBA; no LOB with head turns or head nods on cue                           PT Short Term Goals - 09/19/20 1325      PT SHORT TERM GOAL #1   Title Patient will be compliant with walking 10 min for 4x/week to initiate walking program    Baseline unable to walk 1/4 mile    Time 4    Period Weeks    Status Achieved    Target Date 09/18/20      PT SHORT TERM GOAL #2   Title Patient will report <5/10 pain in her back with ADLs to improve function at home    Baseline 7/10 constant (eval)    Time 4    Period Weeks    Status Achieved    Target Date 09/18/20             PT Long Term Goals - 10/07/20 1341      PT LONG TERM GOAL #1   Title Patient will demo 22/24 on DGI to improve balance with ambulation    Baseline 16/24 (eval)    Time 8    Period Weeks    Status On-going      PT LONG TERM GOAL #2   Title Patient will demo 5x sit to stand without HHA under 17 seconds to improve functional strength    Baseline 25 seconds no HHA; 14 seconds 09/19/20    Time 8    Period Weeks    Status Achieved      PT LONG TERM GOAL #3   Title Patient will report <3/10 pain in  her loewr back with functional activities to improve overall function     Baseline 7/10 constant pain (eval); 5/10 pm 09/19/20; remains 5/10 to 8/10 throughout the day (10/07/20)    Time 8    Period Weeks    Status On-going                 Plan - 10/28/20 1147    Clinical Impression Statement Pt reports mostly just being conscience about her body mechanics for pain management; otherwise, she states she has not implemented other pain management strategies. Treatment focused on reinforcing importance of modifying ADLs and assisting pt with her balance and symptoms of vertigo. Performed Dix-hallpike with (+) R BPPV -- performed Epley x3. Provided pt education & HEP on self management of BPPV at home. By end of session pt with reduced nystagmus but not fully cleared. Continued progressing vestibular exercises.    Personal Factors and Comorbidities Comorbidity 3+    Comorbidities AAA, angina, COPD, CAD s/p CABG, DM, diverticulosis, fibromyalgia, HTN, migraine, MI, OA, PAD, Renal artery stenosis. back pain    Examination-Activity Limitations Bathing;Bend;Caring for Others;Carry;Lift;Stairs;Stand;Squat;Sleep;Transfers    Examination-Participation Restrictions Cleaning;Community Activity;Driving;Laundry;Yard Work;Shop;Meal Prep    Stability/Clinical Decision Making Evolving/Moderate complexity    Rehab Potential Good    PT Frequency 2x / week    PT Duration 8 weeks    PT Treatment/Interventions ADLs/Self Care Home Management;Moist Heat;Neuromuscular re-education;Balance training;Therapeutic exercise;Therapeutic activities;Functional mobility training;Stair training;Gait training;Patient/family education;Manual techniques;Scar mobilization;Passive range of motion;Energy conservation;Joint Manipulations;Spinal Manipulations;Vestibular    PT Next Visit Plan How are the vestibular exercises? Continue to perform core and hip strengthening as pt is able to tolerate. Progress dynamic balance/vestibular exercises. Manual therapy if indicated.    PT Home Exercise Plan Access  Code [email or web address]; [email or web address]    Consulted and Agree with Plan of Care Patient           Patient will benefit from skilled therapeutic intervention in order to improve the following deficits and impairments:  Abnormal gait, Decreased activity tolerance, Decreased balance, Decreased mobility, Decreased endurance, Decreased range of motion, Decreased strength, Hypomobility, Dizziness, Difficulty walking, Impaired flexibility, Postural dysfunction, Pain  Visit Diagnosis: Other abnormalities of gait and mobility  Muscle weakness (generalized)  Unsteadiness on feet  Chronic bilateral [name] back pain without sciatica     Problem List Patient Active Problem List   Diagnosis Date Noted  . S/P lumbar fusion 06/18/2020  . COPD with chronic bronchitis ([place]) 09/28/2018  . Essential hypertension 07/10/2018  . IFG (impaired fasting glucose) 11/10/2017  . DJD (degenerative joint disease), lumbosacral 11/10/2017  . Fibromyalgia 11/10/2017  . Dyshidrotic eczema 11/10/2017  . GERD (gastroesophageal reflux disease) 11/10/2017  . Osteopenia 11/10/2017  . Does use hearing aid 11/10/2017  . 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  . Fatty liver 11/10/2017  . Coronary artery disease   . Peripheral vascular disease with claudication ([place])   . 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  . History of abdominal aortic aneurysm repair 05/18/2011  . History of renal artery stenosis 05/18/2011  . Mixed hyperlipidemia     Liela Rylee April Ma L Schneider [place]ol PT, DPT 10/28/2020, 11:51 AM  [facility] [address] [place], [place], [address] Phone: [phone]   Fax:  [phone]  Name: Megan [name] MRN: [id number] Date of Birth: 1946-01-27

## 2020-10-31 NOTE — Progress Notes (Signed)
Cardiology Office Note   Date:  11/05/2020   ID:  Megan [name], DOB 07/09/1946, MRN [id number]  PCP:  [name], MD  Cardiologist: Tayshaun Kroh Martinique MD  Chief Complaint  Patient presents with  . Coronary Artery Disease      History of Present Illness: Megan [name] is a 74 y.o. female who is seen for follow up CAD.  She has a history  of CAD, HTN, HL, and PAD.   She is status post coronary artery bypass graft surgery in 1995. She had a renal artery stent for renal artery stenosis in 2008. She had a stent graft of an abdominal aortic aneurysm 2009. She is s/p stenting of the left iliac. She is followed by [name] for PAD. Last CT in May 2018. She had [place] in June 2019 showing no endoleak.    She was hospitalized in July 2014 and had cardiac catheterization which showed that her grafts were open. EF normal.  She was hospitalized 11/15/2014 with which he describes as an aching sensation in her chest.  She underwent a treadmill Myoview on 11/17/14 which showed no ischemia and her ejection fraction was 86%.   In June 2019 she had Abdominal US at [facility] showing AAA of 4 cm.   In October 2019 she underwent right rotator cuff repair. No complications.   She was  having progressive issues with her low back that limited her activity and caused pain. She has exhausted conservative measures and underwent lumbar fusion in July by Dr Ronnald Ramp.   She states she is doing well from a cardiac standpoint. She is still having a lot of back pain. Planning another injection. Some dyspnea walking up hills.  No chest pain or palpitations. No claudication. Notes her husband has been diagnosed with PSP and is declining. She had recent follow up with Dr Donzetta Matters and CT was stable.    Past Medical History:  Diagnosis Date  . AAA (abdominal aortic aneurysm) ([place])    a. s/p stent grafting in 2009.  . Adenomatous colon polyp 05/2001  . Adenomatous duodenal polyp   . Allergy   . Anginal pain ([place])    . COPD (chronic obstructive pulmonary disease) ([place])    pt denies COPD  . Coronary artery disease    a. s/p CABG in 1995;  b. 05/2013 Neg MV, EF 82%;  c. 06/2013 Cath: LM nl, LAD 40-50p, LCX nl, RCA 80p/157m, VG->RCA->PDA->PLSA min irregs, LIMA->LAD atretic, vigorous LV fxn.  . Diabetes mellitus without complication (HCC)    history of, resolved. 2017  . Diverticulosis   . Eczema, dyshidrotic   . Eczema, dyshidrotic 1986   Dr. Mathis Fare  . Emphysema of lung ([place])   . Fatty liver   . Fibromyalgia   . GERD (gastroesophageal reflux disease)   . Hyperlipidemia   . Hypertension   . Migraine   . Myocardial infarction ([place])   . Osteoarthritis   . Osteoporosis    unsure, having a bone scan soon  . Peripheral arterial disease (HCC)    left leg diagnosed by Dr Mare Ferrari  . Renal artery stenosis ([place])    a. 2008 s/p PTA    Past Surgical History:  Procedure Laterality Date  . ABDOMINAL AORTAGRAM N/A 03/06/2014   Procedure: ABDOMINAL AORTAGRAM;  Surgeon: Rosetta Posner, MD;  Location: [facility] CATH LAB;  Service: Cardiovascular;  Laterality: N/A;  . ABDOMINAL AORTIC ANEURYSM REPAIR  2009   stenting  . ABDOMINAL HYSTERECTOMY  Total, 1992  . BREAST BIOPSY    . CARDIAC CATHETERIZATION    . CARDIOVASCULAR STRESS TEST  11/11/2009   normal study  . CARPAL TUNNEL RELEASE  08/1993   left wrist  . CARPAL TUNNEL RELEASE  01/1997   right  . CATARACT EXTRACTION, BILATERAL  2021  . CORONARY ARTERY BYPASS GRAFT  07/1994   Triple by Dr Ceasar Mons  . dental implants    . EYE SURGERY  03/2020   cataract  . HAND SURGERY Right 09/2002   Right hand pulley release  . LEFT HEART CATHETERIZATION WITH CORONARY/GRAFT ANGIOGRAM N/A 06/11/2013   Procedure: LEFT HEART CATHETERIZATION WITH [name];  Surgeon: Thayer Headings, MD;  Location: [facility] CATH LAB;  Service: Cardiovascular;  Laterality: N/A;  . MULTIPLE TOOTH EXTRACTIONS  2000   All upper teeth.  Has full dneture.   [name] RENAL ARTERY STENT  2008   . SHOULDER ARTHROSCOPY WITH ROTATOR CUFF REPAIR Right 09/27/2018   Procedure: Right shoulder mini open rotator cuff repair;  Surgeon: Susa Day, MD;  Location: [facility] ORS;  Service: Orthopedics;  Laterality: Right;  90 mins  . thyroid cyst apiration  05/1997 and 07/1998  . TONSILECTOMY, ADENOIDECTOMY, BILATERAL MYRINGOTOMY AND TUBES  1989  . TONSILLECTOMY    . TOTAL ABDOMINAL HYSTERECTOMY  1992     Current Outpatient Medications  Medication Sig Dispense Refill  . Accu-Chek FastClix Lancets MISC Check blood sugar once daily PRN 100 each 1  . albuterol (PROVENTIL HFA;VENTOLIN HFA) 108 (90 Base) MCG/ACT inhaler Inhale 2 puffs into the lungs every 4 (four) hours as needed for wheezing or shortness of breath. 1 Inhaler 2  . ALPRAZolam (XANAX) 0.25 MG tablet Take 1 tablet (0.25 mg total) by mouth at bedtime as needed for sleep. 90 tablet 3  . amLODipine (NORVASC) 5 MG tablet TAKE 1 TABLET BY MOUTH EVERY DAY 90 tablet 2  . aspirin EC 81 MG tablet Take 1 tablet (81 mg total) by mouth daily. (Patient taking differently: Take 325 mg by mouth daily. ) 90 tablet 3  . atorvastatin (LIPITOR) 80 MG tablet TAKE 1 TABLET BY MOUTH EVERY DAY 90 tablet 3  . augmented betamethasone dipropionate (DIPROLENE-AF) 0.05 % cream Apply 1 application topically daily as needed (rash). For hands  1  . B Complex-Biotin-FA (B-100 COMPLEX PO) Take 1 tablet by mouth at bedtime.     . Budeson-Glycopyrrol-Formoterol (BREZTRI AEROSPHERE) 160-9-4.8 MCG/ACT AERO Inhale 2 puffs into the lungs in the morning and at bedtime. 10.7 g 5  . ciclopirox (LOPROX) 0.77 % cream Apply 1 application topically daily as needed (hands fungus).     . clobetasol cream (TEMOVATE) 9.56 % Apply 1 application topically 2 (two) times daily as needed (hands).     . CORDRAN 4 MCG/SQCM TAPE Apply 1 each topically daily as needed (Rash).     . cycloSPORINE (RESTASIS) 0.05 % ophthalmic emulsion Place 1 drop into both eyes 2 (two) times daily.     [name] doxycycline  (VIBRA-TABS) 100 MG tablet Take 1 tablet (100 mg total) by mouth 2 (two) times daily. 10 tablet 0  . gabapentin (NEURONTIN) 300 MG capsule Take 1-2 capsules (300-600 mg total) by mouth 3 (three) times daily. (Patient taking differently: Take 300 mg by mouth 3 (three) times daily. ) 540 capsule 4  . glucose blood (ACCU-CHEK SMARTVIEW) test strip CHECK BLOOD SUGAR ONCE DAILY AS NEEDED 100 strip 1  . hydrALAZINE (APRESOLINE) 10 MG tablet TAKE 1 TABLET BY MOUTH THREE TIMES  A DAY (Patient taking differently: Take 10 mg by mouth 3 (three) times daily. ) 270 tablet 3  . losartan-hydrochlorothiazide (HYZAAR) 100-25 MG tablet Take 1 tablet by mouth daily. 90 tablet 3  . methocarbamol (ROBAXIN) 500 MG tablet Take 1 tablet (500 mg total) by mouth 4 (four) times daily. 35 tablet 0  . Multiple Vitamins-Minerals (MULTIVITAMIN WITH MINERALS) tablet Take 1 tablet by mouth at bedtime.    . nitroGLYCERIN (NITROSTAT) 0.4 MG SL tablet Place 1 tablet (0.4 mg total) under the tongue every 5 (five) minutes as needed for chest pain. 25 tablet 11  . omeprazole (PRILOSEC) 40 MG capsule TAKE 1 CAPSULE BY MOUTH TWICE A DAY (Patient taking differently: Take 40 mg by mouth 2 (two) times daily. ) 60 capsule 11  . oxyCODONE-acetaminophen (PERCOCET) 10-325 MG tablet Take 1 tablet by mouth every 6 (six) hours as needed for pain. 20 tablet 0  . potassium chloride (KLOR-CON) 10 MEQ tablet TAKE 2 TABLETS (20 MEQ TOTAL) BY MOUTH 2 (TWO) TIMES DAILY. 360 tablet 3  . torsemide (DEMADEX) 20 MG tablet Take 1/2 tablet daily (Patient taking differently: Take 10 mg by mouth every other day. ) 45 tablet 3  . tretinoin (RETIN-A) 0.1 % cream Apply 1 application topically every other day. In the evening    . VITAMIN D, ERGOCALCIFEROL, PO Take 1,000 Units by mouth every evening.      No current facility-administered medications for this visit.    Allergies:   Anticoagulant compound, Morphine and related, Bextra [valdecoxib], Hydrocod polst-cpm  polst er, Lipitor [atorvastatin], Mevacor [lovastatin], Plavix  [clopidogrel bisulfate], Zocor [simvastatin], Doxycycline, Metoprolol, Plavix [clopidogrel bisulfate], Codeine, Hydrocod polst-cpm polst er, and Lisinopril    Social History:  The patient  reports that she quit smoking about 24 years ago. Her smoking use included cigarettes. She smoked 0.00 packs per day for 37.00 years. She has never used smokeless tobacco. She reports current alcohol use of about 6.0 - 10.0 standard drinks of alcohol per week. She reports that she does not use drugs.   Family History:  The patient's family history includes AAA (abdominal aortic aneurysm) in her brother; Breast cancer in her sister; Colon cancer in her cousin and paternal grandmother; Diabetes in her brother and sister; Heart attack in her brother, father, mother, and sister; Heart disease in her brother, brother, father, mother, and sister; Hyperlipidemia in her brother and sister; Hypertension in her brother and sister; Liver cancer in her sister.    ROS:  Please see the history of present illness.   Otherwise, review of systems are positive for none.   All other systems are reviewed and negative.    PHYSICAL EXAM: VS:  BP (!) 126/54 (BP Location: Left Arm, Patient Position: Sitting)   Pulse 79   Ht 5' (1.524 m)   Wt 135 lb (61.2 kg)   SpO2 95%   BMI 26.37 kg/m  , BMI Body mass index is 26.37 kg/m. GENERAL:  Overweight  WF in NAD HEENT:  PERRL, EOMI, sclera are clear. Oropharynx is clear. NECK:  No jugular venous distention, carotid upstroke brisk and symmetric, no bruits, no thyromegaly or adenopathy LUNGS:  Clear to auscultation bilaterally CHEST:  Unremarkable HEART:  RRR,  PMI not displaced or sustained,S1 and S2 within normal limits, no S3, no S4: no clicks, no rubs, no murmurs ABD:  Soft, nontender. BS +, no masses or bruits. No hepatomegaly, no splenomegaly EXT:  1 + pulses throughout, no edema, no cyanosis no clubbing SKIN:  Warm  and dry.  No rashes NEURO:  Alert and oriented x 3. Cranial nerves II through XII intact. PSYCH:  Cognitively intact     EKG:  EKG is not done today.     Recent Labs: 06/13/2020: ALT 26; BUN 11; Creatinine, Ser 0.73; Hemoglobin 14.8; Platelets 234.0; Potassium 3.6; Sodium 138; TSH 1.81   Labs from primary care 12/15/16: A1c 5.6%. : Cholesterol 167 Trig-209, HDL 52, LDL 73.  AST 51, ALT 65. Normal renal indices. TSH is normal. Dated 06/24/17: CRP <0.3. Glucose 120, AST 58, ALT 80, other chemistries normal.  Dated 09/28/18: normal CBC, CMET  Lipid Panel    Component Value Date/Time   CHOL 160 06/13/2020 1017   CHOL 154 01/01/2020 0835   TRIG 294.0 (H) 06/13/2020 1017   HDL 51.10 06/13/2020 1017   HDL 50 01/01/2020 0835   CHOLHDL 3 06/13/2020 1017   VLDL 58.8 (H) 06/13/2020 1017   LDLCALC 64 01/01/2020 0835   LDLDIRECT 82.0 06/13/2020 1017      Wt Readings from Last 3 Encounters:  11/05/20 135 lb (61.2 kg)  09/26/20 134 lb (60.8 kg)  06/18/20 152 lb 11.2 oz (69.3 kg)      ASSESSMENT AND PLAN:  1.CAD status post CABG in 1995. Most recent cardiac cath in July 2014 showed no recurrent stenoses and patent grafts. Most recent treadmill Myoview 11/17/14 normal with ejection fraction 86%. She has no significant symptoms of angina. No CHF. She notes that prior stress tests really haven't been very helpful.  2. HTN with hypertensive heart disease. BP is under  good control. Often forgets to take mid day hydralazine 3. Hypercholesterolemia- LDL is at goal but triglycerides persistently elevated. Np changed with Vascepa and this was too expensive.  4.  Status post stent to right renal artery 2008. Patent by CT in October 5. Status post stent graft to abdominal aortic aneurysm 2009.  S/p iliac stent in 2016. Stable by CT in October 6. Peripheral artery disease followed by Dr. Donzetta Matters 7. Lumbar spine disease. S/p fusion with persistent pain.  Follow up in 6 months with  labs   Signed, Tuana Hoheisel Martinique MD, [facility]    11/05/2020 10:55 AM    [place]

## 2020-11-04 ENCOUNTER — Other Ambulatory Visit: Payer: Self-pay

## 2020-11-04 ENCOUNTER — Ambulatory Visit: Payer: Medicare Other

## 2020-11-04 ENCOUNTER — Ambulatory Visit: Payer: Medicare Other | Admitting: Physical Therapy

## 2020-11-04 DIAGNOSIS — R2681 Unsteadiness on feet: Secondary | ICD-10-CM

## 2020-11-04 DIAGNOSIS — S32030A Wedge compression fracture of third lumbar vertebra, initial encounter for closed fracture: Secondary | ICD-10-CM | POA: Diagnosis not present

## 2020-11-04 DIAGNOSIS — R2689 Other abnormalities of gait and mobility: Secondary | ICD-10-CM | POA: Diagnosis not present

## 2020-11-04 DIAGNOSIS — M48061 Spinal stenosis, lumbar region without neurogenic claudication: Secondary | ICD-10-CM | POA: Diagnosis not present

## 2020-11-04 DIAGNOSIS — M6281 Muscle weakness (generalized): Secondary | ICD-10-CM

## 2020-11-04 DIAGNOSIS — G8929 Other chronic pain: Secondary | ICD-10-CM | POA: Diagnosis not present

## 2020-11-04 DIAGNOSIS — M545 Low back pain, unspecified: Secondary | ICD-10-CM | POA: Diagnosis not present

## 2020-11-05 ENCOUNTER — Ambulatory Visit: Payer: Medicare Other | Admitting: Cardiology

## 2020-11-05 ENCOUNTER — Encounter: Payer: Self-pay | Admitting: Cardiology

## 2020-11-05 VITALS — BP 126/54 | HR 79 | Ht 60.0 in | Wt 135.0 lb

## 2020-11-05 DIAGNOSIS — I714 Abdominal aortic aneurysm, without rupture, unspecified: Secondary | ICD-10-CM

## 2020-11-05 DIAGNOSIS — I119 Hypertensive heart disease without heart failure: Secondary | ICD-10-CM | POA: Diagnosis not present

## 2020-11-05 DIAGNOSIS — I25708 Atherosclerosis of coronary artery bypass graft(s), unspecified, with other forms of angina pectoris: Secondary | ICD-10-CM

## 2020-11-05 DIAGNOSIS — I739 Peripheral vascular disease, unspecified: Secondary | ICD-10-CM

## 2020-11-05 NOTE — Therapy (Signed)
[facility] [address] [place], [place], [address] Phone: [phone]   Fax:  [phone]  Physical Therapy Treatment  Patient Details  Name: Megan [name] MRN: [id number] Date of Birth: May 05, 1946 Referring Provider (PT): Glenford Peers, NP   Encounter Date: 11/04/2020   PT End of Session - 11/04/20 1744    Visit Number 14    Number of Visits 17    Date for PT Re-Evaluation 10/16/20    Authorization Type Medicare/Medicaid    Progress Note Due on Visit 10    PT Start Time 1615    PT Stop Time 1650    PT Time Calculation (min) 35 min    Equipment Utilized During Treatment Back brace    Activity Tolerance Patient tolerated treatment well    Behavior During Therapy [facility] for tasks assessed/performed           Past Medical History:  Diagnosis Date  . AAA (abdominal aortic aneurysm) ([place])    a. s/p stent grafting in 2009.  . Adenomatous colon polyp 05/2001  . Adenomatous duodenal polyp   . Allergy   . Anginal pain ([place])   . COPD (chronic obstructive pulmonary disease) ([place])    pt denies COPD  . Coronary artery disease    a. s/p CABG in 1995;  b. 05/2013 Neg MV, EF 82%;  c. 06/2013 Cath: LM nl, LAD 40-50p, LCX nl, RCA 80p/178m, VG->RCA->PDA->PLSA min irregs, LIMA->LAD atretic, vigorous LV fxn.  . Diabetes mellitus without complication (HCC)    history of, resolved. 2017  . Diverticulosis   . Eczema, dyshidrotic   . Eczema, dyshidrotic 1986   Dr. Mathis Fare  . Emphysema of lung ([place])   . Fatty liver   . Fibromyalgia   . GERD (gastroesophageal reflux disease)   . Hyperlipidemia   . Hypertension   . Migraine   . Myocardial infarction ([place])   . Osteoarthritis   . Osteoporosis    unsure, having a bone scan soon  . Peripheral arterial disease (HCC)    left leg diagnosed by Dr Mare Ferrari  . Renal artery stenosis ([place])    a. 2008 s/p PTA    Past Surgical History:  Procedure Laterality Date  .  ABDOMINAL AORTAGRAM N/A 03/06/2014   Procedure: ABDOMINAL AORTAGRAM;  Surgeon: Rosetta Posner, MD;  Location: [facility] C[facility] LAB;  Service: Cardiovascular;  Laterality: N/A;  . ABDOMINAL AORTIC ANEURYSM REPAIR  2009   stenting  . ABDOMINAL HYSTERECTOMY     Total, 1992  . BREAST BIOPSY    . CARDIAC CATHETERIZATION    . CARDIOVASCULAR STRESS TEST  11/11/2009   normal study  . CARPAL TUNNEL RELEASE  08/1993   left wrist  . CARPAL TUNNEL RELEASE  01/1997   right  . CATARACT EXTRACTION, BILATERAL  2021  . CORONARY ARTERY BYPASS GRAFT  07/1994   Triple by Dr Ceasar Mons  . dental implants    . EYE SURGERY  03/2020   cataract  . HAND SURGERY Right 09/2002   Right hand pulley release  . LEFT HEART CATHETERIZATION WITH CORONARY/GRAFT ANGIOGRAM N/A 06/11/2013   Procedure: LEFT HEART CATHETERIZATION WITH [name];  Surgeon: Thayer Headings, MD;  Location[facility] C[facility] LAB;  Service: Cardiovascular;  Laterality: N/A;  . MULTIPLE TOOTH EXTRACTIONS  2000   All upper teeth.  Has full dneture.   [name] RENAL ARTERY STENT  2008  . SHOULDER ARTHROSCOPY WITH ROTATOR CUFF REPAIR Right 09/27/2018   Procedure: Right shoulder  mini open rotator cuff repair;  Surgeon: Susa Day, MD;  Location: [facility] ORS;  Service: Orthopedics;  Laterality: Right;  90 mins  . thyroid cyst apiration  05/1997 and 07/1998  . TONSILECTOMY, ADENOIDECTOMY, BILATERAL MYRINGOTOMY AND TUBES  1989  . TONSILLECTOMY    . TOTAL ABDOMINAL HYSTERECTOMY  1992    There were no vitals filed for this visit.   Subjective Assessment - 11/04/20 1700    Subjective Pt states she is no longer wearing the back brace. She reports improved pain due to brace not sticking into her side. Pt reports increased nausea on return home after last session. Pt states she's been performing the Epley maneuver at home.    Pertinent History AAA, angina, COPD, CAD s/p CABG, DM, diverticulosis, fibromyalgia, HTN, migraine, MI, OA, PAD, Renal artery stenosis. back pain     Limitations Standing;Lifting;Walking;House hold activities    How long can you sit comfortably? no limits    How long can you stand comfortably? 5-10 min    How long can you walk comfortably? 5-10 min    Diagnostic tests MRI: L3 compression fracture per pt report    Pain Onset More than a month ago                              Vestibular Treatment/Exercise - 11/05/20 0001      Vestibular Treatment/Exercise   Canalith Repositioning Semont Procedure Right Posterior      Semont Procedure Right Posterior   Number of Reps  3    Overall Response  Improved Symptoms    Response Details  Assessed with sidelying Dix-Hallpike. rep 1: 10 sec lag with 12 sec nystagmus; 2nd rep: 5 sec lag with 9 sec R rotary nystagmus; rep 3: no nystagmus noted for 1 min           Rest of session spent for pt recovery due to feelings of nausea        PT Short Term Goals - 09/19/20 1325      PT SHORT TERM GOAL #1   Title Patient will be compliant with walking 10 min for 4x/week to initiate walking program    Baseline unable to walk 1/4 mile    Time 4    Period Weeks    Status Achieved    Target Date 09/18/20      PT SHORT TERM GOAL #2   Title Patient will report <5/10 pain in her back with ADLs to improve function at home    Baseline 7/10 constant (eval)    Time 4    Period Weeks    Status Achieved    Target Date 09/18/20             PT Long Term Goals - 10/07/20 1341      PT LONG TERM GOAL #1   Title Patient will demo 22/24 on DGI to improve balance with ambulation    Baseline 16/24 (eval)    Time 8    Period Weeks    Status On-going      PT LONG TERM GOAL #2   Title Patient will demo 5x sit to stand without HHA under 17 seconds to improve functional strength    Baseline 25 seconds no HHA; 14 seconds 09/19/20    Time 8    Period Weeks    Status Achieved      PT LONG TERM GOAL #3   Title Patient will report <  3/10 pain in her loewr back with functional  activities to improve overall function    Baseline 7/10 constant pain (eval); 5/10 pm 09/19/20; remains 5/10 to 8/10 throughout the day (10/07/20)    Time 8    Period Weeks    Status On-going                 Plan - 11/04/20 1701    Clinical Impression Statement Treatment session focused on trying to clear pt's BPPV. Pt with (+) posterior canal BPPV. Reduced symptoms and no noted nystagmus seen by end of session. Discussed with pt sleeping position to aid in further clearing her BPPV. Checked for horizontal canal BPPV as well; however, pt appears asymptomatic. Increased time needed between reps of canalith repositioning.   Personal Factors and Comorbidities Comorbidity 3+    Comorbidities AAA, angina, COPD, CAD s/p CABG, DM, diverticulosis, fibromyalgia, HTN, migraine, MI, OA, PAD, Renal artery stenosis. back pain    Examination-Activity Limitations Bathing;Bend;Caring for Others;Carry;Lift;Stairs;Stand;Squat;Sleep;Transfers    Examination-Participation Restrictions Cleaning;Community Activity;Driving;Laundry;Yard Work;Shop;Meal Prep    Stability/Clinical Decision Making Evolving/Moderate complexity    Rehab Potential Good    PT Frequency 2x / week    PT Duration 8 weeks    PT Treatment/Interventions ADLs/Self Care Home Management;Moist Heat;Neuromuscular re-education;Balance training;Therapeutic exercise;Therapeutic activities;Functional mobility training;Stair training;Gait training;Patient/family education;Manual techniques;Scar mobilization;Passive range of motion;Energy conservation;Joint Manipulations;Spinal Manipulations;Vestibular    PT Next Visit Plan How are the vestibular exercises? Continue to perform core and hip strengthening as pt is able to tolerate. Progress dynamic balance/vestibular exercises. Manual therapy if indicated.    PT Home Exercise Plan Access Code [email or web address]; [email or web address]    Consulted and Agree with Plan of Care Patient           Patient will benefit from  skilled therapeutic intervention in order to improve the following deficits and impairments:  Abnormal gait, Decreased activity tolerance, Decreased balance, Decreased mobility, Decreased endurance, Decreased range of motion, Decreased strength, Hypomobility, Dizziness, Difficulty walking, Impaired flexibility, Postural dysfunction, Pain  Visit Diagnosis: Other abnormalities of gait and mobility  Muscle weakness (generalized)  Unsteadiness on feet  Chronic bilateral low back pain without sciatica     Problem List Patient Active Problem List   Diagnosis Date Noted  . S/P lumbar fusion 06/18/2020  . COPD with chronic bronchitis ([place]) 09/28/2018  . Essential hypertension 07/10/2018  . IFG (impaired fasting glucose) 11/10/2017  . DJD (degenerative joint disease), lumbosacral 11/10/2017  . Fibromyalgia 11/10/2017  . Dyshidrotic eczema 11/10/2017  . GERD (gastroesophageal reflux disease) 11/10/2017  . Osteopenia 11/10/2017  . Does use hearing aid 11/10/2017  . 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  . Fatty liver 11/10/2017  . Coronary artery disease   . Peripheral vascular disease with claudication ([place])   . 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  . History of abdominal aortic aneurysm repair 05/18/2011  . History of renal artery stenosis 05/18/2011  . Mixed hyperlipidemia     Kemuel Buchmann April Ma L Carolanne Mercier PT, DPT 11/05/2020, 10:09 AM  [facility] [address] [place], [place], [address] Phone: [phone]   Fax:  [phone]  Name: Megan [name] MRN: [id number] Date of Birth: March 14, 1946

## 2020-11-07 ENCOUNTER — Ambulatory Visit: Payer: Medicare Other

## 2020-11-11 ENCOUNTER — Ambulatory Visit: Payer: Medicare Other

## 2020-11-17 ENCOUNTER — Ambulatory Visit: Payer: Medicare Other | Admitting: Family Medicine

## 2020-11-18 ENCOUNTER — Other Ambulatory Visit: Payer: Self-pay

## 2020-11-18 ENCOUNTER — Ambulatory Visit: Payer: Medicare Other | Attending: Student

## 2020-11-18 ENCOUNTER — Ambulatory Visit
Admission: RE | Admit: 2020-11-18 | Discharge: 2020-11-18 | Disposition: A | Discharge status: Home / Self Care | Payer: Medicare Other | Source: Ambulatory Visit | Attending: Family Medicine | Admitting: Family Medicine

## 2020-11-18 DIAGNOSIS — R2681 Unsteadiness on feet: Secondary | ICD-10-CM

## 2020-11-18 DIAGNOSIS — M6281 Muscle weakness (generalized): Secondary | ICD-10-CM | POA: Diagnosis not present

## 2020-11-18 DIAGNOSIS — M545 Low back pain, unspecified: Secondary | ICD-10-CM | POA: Diagnosis not present

## 2020-11-18 DIAGNOSIS — G8929 Other chronic pain: Secondary | ICD-10-CM | POA: Diagnosis not present

## 2020-11-18 DIAGNOSIS — R2689 Other abnormalities of gait and mobility: Secondary | ICD-10-CM | POA: Diagnosis not present

## 2020-11-18 DIAGNOSIS — Z1231 Encounter for screening mammogram for malignant neoplasm of breast: Secondary | ICD-10-CM

## 2020-11-18 NOTE — Therapy (Signed)
[facility] [address] [place], [place], [address] Phone: [phone]   Fax:  [phone]  Physical Therapy Treatment  Patient Details  Name: Megan [name] MRN: [id number] Date of Birth: [birth year] Referring Provider (PT): Glenford Peers, NP   Encounter Date: 11/18/2020   PT End of Session - 11/18/20 1550    Visit Number 15    Number of Visits 17    Date for PT Re-Evaluation 10/16/20    Authorization Type Medicare/Medicaid    Progress Note Due on Visit 10    PT Start Time 1450    PT Stop Time 1530    PT Time Calculation (min) 40 min    Equipment Utilized During Treatment Back brace    Activity Tolerance Patient tolerated treatment well    Behavior During Therapy [facility] for tasks assessed/performed           Past Medical History:  Diagnosis Date  . AAA (abdominal aortic aneurysm) ([place])    a. s/p stent grafting in 2009.  . Adenomatous colon polyp 05/2001  . Adenomatous duodenal polyp   . Allergy   . Anginal pain ([place])   . COPD (chronic obstructive pulmonary disease) ([place])    pt denies COPD  . Coronary artery disease    a. s/p CABG in 1995;  b. 05/2013 Neg MV, EF 82%;  c. 06/2013 Cath: LM nl, LAD 40-50p, LCX nl, RCA 80p/194m, VG->RCA->PDA->PLSA min irregs, LIMA->LAD atretic, vigorous LV fxn.  . Diabetes mellitus without complication (HCC)    history of, resolved. 2017  . Diverticulosis   . Eczema, dyshidrotic   . Eczema, dyshidrotic 1986   Dr. Mathis Fare  . Emphysema of lung ([place])   . Fatty liver   . Fibromyalgia   . GERD (gastroesophageal reflux disease)   . Hyperlipidemia   . Hypertension   . Migraine   . Myocardial infarction ([place])   . Osteoarthritis   . Osteoporosis    unsure, having a bone scan soon  . Peripheral arterial disease (HCC)    left leg diagnosed by Dr Mare Ferrari  . Renal artery stenosis ([place])    a. 2008 s/p PTA    Past Surgical History:  Procedure Laterality Date  .  ABDOMINAL AORTAGRAM N/A 03/06/2014   Procedure: ABDOMINAL AORTAGRAM;  Surgeon: Rosetta Posner, MD;  Location: [facility] CATH LAB;  Service: Cardiovascular;  Laterality: N/A;  . ABDOMINAL AORTIC ANEURYSM REPAIR  2009   stenting  . ABDOMINAL HYSTERECTOMY     Total, 1992  . BREAST BIOPSY    . CARDIAC CATHETERIZATION    . CARDIOVASCULAR STRESS TEST  11/11/2009   normal study  . CARPAL TUNNEL RELEASE  08/1993   left wrist  . CARPAL TUNNEL RELEASE  01/1997   right  . CATARACT EXTRACTION, BILATERAL  2021  . CORONARY ARTERY BYPASS GRAFT  07/1994   Triple by Dr Ceasar Mons  . dental implants    . EYE SURGERY  03/2020   cataract  . HAND SURGERY Right 09/2002   Right hand pulley release  . LEFT HEART CATHETERIZATION WITH CORONARY/GRAFT ANGIOGRAM N/A 06/11/2013   Procedure: LEFT HEART CATHETERIZATION WITH [name];  Surgeon: Thayer Headings, MD;  Location: [facility] CATH LAB;  Service: Cardiovascular;  Laterality: N/A;  . MULTIPLE TOOTH EXTRACTIONS  2000   All upper teeth.  Has full dneture.   [name] RENAL ARTERY STENT  2008  . SHOULDER ARTHROSCOPY WITH ROTATOR CUFF REPAIR Right 09/27/2018   Procedure: Right shoulder  mini open rotator cuff repair;  Surgeon: Susa Day, MD;  Location: [facility] ORS;  Service: Orthopedics;  Laterality: Right;  90 mins  . thyroid cyst apiration  05/1997 and 07/1998  . TONSILECTOMY, ADENOIDECTOMY, BILATERAL MYRINGOTOMY AND TUBES  1989  . TONSILLECTOMY    . TOTAL ABDOMINAL HYSTERECTOMY  1992    There were no vitals filed for this visit.   Subjective Assessment - 11/18/20 1521    Subjective Pt reports back is better. Her husband is declining and she wants to know how to care for him without hurting herself.    Pertinent History AAA, angina, COPD, CAD s/p CABG, DM, diverticulosis, fibromyalgia, HTN, migraine, MI, OA, PAD, Renal artery stenosis. back pain    Limitations Standing;Lifting;Walking;House hold activities    How long can you sit comfortably? no limits    How long can  you stand comfortably? 5-10 min    How long can you walk comfortably? 5-10 min    Diagnostic tests MRI: L3 compression fracture per pt report    Pain Onset More than a month ago                 Patient education Pt was wondering if she should get wheelchair for her husband. Pt already has a transport chair for her husband. Pt was asked why she would need a wheelchair as her husband may not be able to propel himself with arms due to R arm being weak and he would have to use legs to propel. She already has transport chair and he can propel himself in transport chair.  Pt got Ustep walker donated to her husband. Pt verbally educated on how to use it with her patient.  Pt was thinking about getting heavy chair for her husband so he can practice sit to stand. Pt educated that she asked who will assemble the chair for her? Pt educated to work with his home PT to see if they can practice sit to stand from his own bed with bed rail as it is more functional as he spends more time in bed and he normally gets up from bed to go to the bathroom.  Progressed countertop UE/LE extensions to quadruped LE extension sonly: 3 x 10, 2 x 10 with WB through hands and then pt asked to drop down to elbows to reduce wrist pain: 1 x 10  Lateral step downs: 6" box: 2 x 10, pt cued to keep heel down on ipsilateral side and bring contralalterl heel down first as she steps down on floor. Trying not to push off with floor leg when coming back up.: 2 x 10, R and L  Pt needed cues to slow down throughout exercises.  Fwd and lateral steps: with stepping to edge of her BOS: 2 x 10 R and L and fwd.                      PT Education - 11/18/20 1557    Education Details Pt educated on having her pharmacist review medications for any cross interatctions that may be potentially cause any dizziness.            PT Short Term Goals - 09/19/20 1325      PT SHORT TERM GOAL #1   Title Patient will be  compliant with walking 10 min for 4x/week to initiate walking program    Baseline unable to walk 1/4 mile    Time 4    Period Weeks  Status Achieved    Target Date 09/18/20      PT SHORT TERM GOAL #2   Title Patient will report <5/10 pain in her back with ADLs to improve function at home    Baseline 7/10 constant (eval)    Time 4    Period Weeks    Status Achieved    Target Date 09/18/20             PT Long Term Goals - 10/07/20 1341      PT LONG TERM GOAL #1   Title Patient will demo 22/24 on DGI to improve balance with ambulation    Baseline 16/24 (eval)    Time 8    Period Weeks    Status On-going      PT LONG TERM GOAL #2   Title Patient will demo 5x sit to stand without HHA under 17 seconds to improve functional strength    Baseline 25 seconds no HHA; 14 seconds 09/19/20    Time 8    Period Weeks    Status Achieved      PT LONG TERM GOAL #3   Title Patient will report <3/10 pain in her loewr back with functional activities to improve overall function    Baseline 7/10 constant pain (eval); 5/10 pm 09/19/20; remains 5/10 to 8/10 throughout the day (10/07/20)    Time 8    Period Weeks    Status On-going                 Plan - 11/18/20 1526    Clinical Impression Statement Today's session was focused on discussing how she cares for her husband to reduce strain on her body. We reviewed exercises and progressed them accordingly.    Personal Factors and Comorbidities Comorbidity 3+    Comorbidities AAA, angina, COPD, CAD s/p CABG, DM, diverticulosis, fibromyalgia, HTN, migraine, MI, OA, PAD, Renal artery stenosis. back pain    Examination-Activity Limitations Bathing;Bend;Caring for Others;Carry;Lift;Stairs;Stand;Squat;Sleep;Transfers    Examination-Participation Restrictions Cleaning;Community Activity;Driving;Laundry;Yard Work;Shop;Meal Prep    Stability/Clinical Decision Making Evolving/Moderate complexity    Rehab Potential Good    PT Frequency 2x / week     PT Duration 8 weeks    PT Treatment/Interventions ADLs/Self Care Home Management;Moist Heat;Neuromuscular re-education;Balance training;Therapeutic exercise;Therapeutic activities;Functional mobility training;Stair training;Gait training;Patient/family education;Manual techniques;Scar mobilization;Passive range of motion;Energy conservation;Joint Manipulations;Spinal Manipulations;Vestibular    PT Next Visit Plan Continue to progress q-ped LE extensions and balance exercises over next 2 sessions.    PT Home Exercise Plan Access Code [email or web address]; [email or web address]    Consulted and Agree with Plan of Care Patient           Patient will benefit from skilled therapeutic intervention in order to improve the following deficits and impairments:  Abnormal gait,Decreased activity tolerance,Decreased balance,Decreased mobility,Decreased endurance,Decreased range of motion,Decreased strength,Hypomobility,Dizziness,Difficulty walking,Impaired flexibility,Postural dysfunction,Pain  Visit Diagnosis: Other abnormalities of gait and mobility  Muscle weakness (generalized)  Chronic bilateral low back pain without sciatica  Unsteadiness on feet     Problem List Patient Active Problem List   Diagnosis Date Noted  . S/P lumbar fusion 06/18/2020  . COPD with chronic bronchitis ([place]) 09/28/2018  . Essential hypertension 07/10/2018  . IFG (impaired fasting glucose) 11/10/2017  . DJD (degenerative joint disease), lumbosacral 11/10/2017  . Fibromyalgia 11/10/2017  . Dyshidrotic eczema 11/10/2017  . GERD (gastroesophageal reflux disease) 11/10/2017  . Osteopenia 11/10/2017  . Does use hearing aid 11/10/2017  . 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  . Fatty liver 11/10/2017  . Coronary  artery disease   . Peripheral vascular disease with claudication ([place])   . 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  . History of abdominal aortic aneurysm repair 05/18/2011  . History of renal artery stenosis 05/18/2011  . Mixed hyperlipidemia      Kerrie Pleasure, PT 11/18/2020, 3:57 PM  [facility] [address] [place], [place], [address] Phone: [phone]   Fax:  [phone]  Name: Megan [name] MRN: [id number] Date of Birth: 09/23/1946

## 2020-11-23 ENCOUNTER — Other Ambulatory Visit: Payer: Self-pay | Admitting: Gastroenterology

## 2020-11-25 ENCOUNTER — Other Ambulatory Visit: Payer: Self-pay

## 2020-11-25 ENCOUNTER — Ambulatory Visit: Payer: Medicare Other

## 2020-11-25 DIAGNOSIS — G8929 Other chronic pain: Secondary | ICD-10-CM

## 2020-11-25 DIAGNOSIS — M545 Low back pain, unspecified: Secondary | ICD-10-CM | POA: Diagnosis not present

## 2020-11-25 DIAGNOSIS — R2681 Unsteadiness on feet: Secondary | ICD-10-CM

## 2020-11-25 DIAGNOSIS — R2689 Other abnormalities of gait and mobility: Secondary | ICD-10-CM | POA: Diagnosis not present

## 2020-11-25 DIAGNOSIS — M6281 Muscle weakness (generalized): Secondary | ICD-10-CM | POA: Diagnosis not present

## 2020-11-25 NOTE — Therapy (Signed)
[facility] [address] [place], [place], [address] Phone: [phone]   Fax:  [phone]  Physical Therapy Treatment  Patient Details  Name: Megan [name] MRN: [id number] Date of Birth: 1946-07-20 Referring Provider (PT): Glenford Peers, NP   Encounter Date: 11/25/2020   PT End of Session - 11/25/20 1458    Visit Number 16    Number of Visits 17    Date for PT Re-Evaluation 10/16/20    Authorization Type Medicare/Medicaid    Progress Note Due on Visit 10    Equipment Utilized During Treatment Back brace    Activity Tolerance Patient tolerated treatment well    Behavior During Therapy [facility] for tasks assessed/performed           Past Medical History:  Diagnosis Date  . AAA (abdominal aortic aneurysm) ([place])    a. s/p stent grafting in 2009.  . Adenomatous colon polyp 05/2001  . Adenomatous duodenal polyp   . Allergy   . Anginal pain ([place])   . COPD (chronic obstructive pulmonary disease) ([place])    pt denies COPD  . Coronary artery disease    a. s/p CABG in 1995;  b. 05/2013 Neg MV, EF 82%;  c. 06/2013 Cath: LM nl, LAD 40-50p, LCX nl, RCA 80p/146m, VG->RCA->PDA->PLSA min irregs, LIMA->LAD atretic, vigorous LV fxn.  . Diabetes mellitus without complication (HCC)    history of, resolved. 2017  . Diverticulosis   . Eczema, dyshidrotic   . Eczema, dyshidrotic 1986   Dr. Mathis Fare  . Emphysema of lung ([place])   . Fatty liver   . Fibromyalgia   . GERD (gastroesophageal reflux disease)   . Hyperlipidemia   . Hypertension   . Migraine   . Myocardial infarction ([place])   . Osteoarthritis   . Osteoporosis    unsure, having a bone scan soon  . Peripheral arterial disease (HCC)    left leg diagnosed by Dr Mare Ferrari  . Renal artery stenosis ([place])    a. 2008 s/p PTA    Past Surgical History:  Procedure Laterality Date  . ABDOMINAL AORTAGRAM N/A 03/06/2014   Procedure: ABDOMINAL AORTAGRAM;  Surgeon: Rosetta Posner, MD;  Location: [facility] CATH LAB;  Service: Cardiovascular;  Laterality: N/A;  . ABDOMINAL AORTIC ANEURYSM REPAIR  2009   stenting  . ABDOMINAL HYSTERECTOMY     Total, 1992  . BREAST BIOPSY    . CARDIAC CATHETERIZATION    . CARDIOVASCULAR STRESS TEST  11/11/2009   normal study  . CARPAL TUNNEL RELEASE  08/1993   left wrist  . CARPAL TUNNEL RELEASE  01/1997   right  . CATARACT EXTRACTION, BILATERAL  2021  . CORONARY ARTERY BYPASS GRAFT  07/1994   Triple by Dr Ceasar Mons  . dental implants    . EYE SURGERY  03/2020   cataract  . HAND SURGERY Right 09/2002   Right hand pulley release  . LEFT HEART CATHETERIZATION WITH CORONARY/GRAFT ANGIOGRAM N/A 06/11/2013   Procedure: LEFT HEART CATHETERIZATION WITH [name];  Surgeon: Thayer Headings, MD;  Location: [facility] CATH LAB;  Service: Cardiovascular;  Laterality: N/A;  . MULTIPLE TOOTH EXTRACTIONS  2000   All upper teeth.  Has full dneture.   [name] RENAL ARTERY STENT  2008  . SHOULDER ARTHROSCOPY WITH ROTATOR CUFF REPAIR Right 09/27/2018   Procedure: Right shoulder mini open rotator cuff repair;  Surgeon: Susa Day, MD;  Location: [facility] ORS;  Service: Orthopedics;  Laterality: Right;  90 mins  .  thyroid cyst apiration  05/1997 and 07/1998  . TONSILECTOMY, ADENOIDECTOMY, BILATERAL MYRINGOTOMY AND TUBES  1989  . TONSILLECTOMY    . TOTAL ABDOMINAL HYSTERECTOMY  1992    There were no vitals filed for this visit.   Subjective Assessment - 11/25/20 1457    Subjective Pt reports her husband fell out of bed few days ago and she had to help me get off the floor which hurt her back more.    Pertinent History AAA, angina, COPD, CAD s/p CABG, DM, diverticulosis, fibromyalgia, HTN, migraine, MI, OA, PAD, Renal artery stenosis. back pain    Limitations Standing;Lifting;Walking;House hold activities    How long can you sit comfortably? no limits    How long can you stand comfortably? 5-10 min    How long can you walk comfortably? 5-10 min     Diagnostic tests MRI: L3 compression fracture per pt report    Pain Onset More than a month ago             Q-ped: elbow on knee pad (pt educated to do this exercise on sofa where she keeps elbow on armm rest and knee on sofa): 10x LE only, 10x with opposite UE and LE  Practiced floor transfer with patient to educate her on how to take her patient through steps of getting him up the floor. Pt educated on having him lay on his back, roll over to stomach, hands and knees, help him get his strong leg up, transfers arms to bed/chair and then he gets off the floor. Had patient do the floor transfer herself to practice. We talked about if she feels unsafe or husband feels unsafe, call EMS to help him get off the floor instead of trying to lift him off the floor and hurt her back more.  Fwd and lateral steps: to edge of limits of stability: 20x R and L Lateral step downs: 6" box: 10x, cues to keep ipsilateral foot flat and try not to push off with contralateral foot from floor.                          PT Short Term Goals - 09/19/20 1325      PT SHORT TERM GOAL #1   Title Patient will be compliant with walking 10 min for 4x/week to initiate walking program    Baseline unable to walk 1/4 mile    Time 4    Period Weeks    Status Achieved    Target Date 09/18/20      PT SHORT TERM GOAL #2   Title Patient will report <5/10 pain in her back with ADLs to improve function at home    Baseline 7/10 constant (eval)    Time 4    Period Weeks    Status Achieved    Target Date 09/18/20             PT Long Term Goals - 10/07/20 1341      PT LONG TERM GOAL #1   Title Patient will demo 22/24 on DGI to improve balance with ambulation    Baseline 16/24 (eval)    Time 8    Period Weeks    Status On-going      PT LONG TERM GOAL #2   Title Patient will demo 5x sit to stand without HHA under 17 seconds to improve functional strength    Baseline 25 seconds no HHA; 14  seconds 09/19/20  Time 8    Period Weeks    Status Achieved      PT LONG TERM GOAL #3   Title Patient will report <3/10 pain in her loewr back with functional activities to improve overall function    Baseline 7/10 constant pain (eval); 5/10 pm 09/19/20; remains 5/10 to 8/10 throughout the day (10/07/20)    Time 8    Period Weeks    Status On-going                 Plan - 11/25/20 1536    Clinical Impression Statement Patient needed re-education on correct form for exercises. Patient benefited from floor transfer education to help with her husband and to reduce strain in her back.    Personal Factors and Comorbidities Comorbidity 3+    Comorbidities AAA, angina, COPD, CAD s/p CABG, DM, diverticulosis, fibromyalgia, HTN, migraine, MI, OA, PAD, Renal artery stenosis. back pain    Examination-Activity Limitations Bathing;Bend;Caring for Others;Carry;Lift;Stairs;Stand;Squat;Sleep;Transfers    Examination-Participation Restrictions Cleaning;Community Activity;Driving;Laundry;Yard Work;Shop;Meal Prep    Stability/Clinical Decision Making Evolving/Moderate complexity    Rehab Potential Good    PT Frequency 2x / week    PT Duration 8 weeks    PT Treatment/Interventions ADLs/Self Care Home Management;Moist Heat;Neuromuscular re-education;Balance training;Therapeutic exercise;Therapeutic activities;Functional mobility training;Stair training;Gait training;Patient/family education;Manual techniques;Scar mobilization;Passive range of motion;Energy conservation;Joint Manipulations;Spinal Manipulations;Vestibular    PT Next Visit Plan Review HEP, give final HEP instructions, discharge    PT Home Exercise Plan Access Code [email or web address]    Consulted and Agree with Plan of Care Patient           Patient will benefit from skilled therapeutic intervention in order to improve the following deficits and impairments:  Abnormal gait,Decreased activity tolerance,Decreased balance,Decreased  mobility,Decreased endurance,Decreased range of motion,Decreased strength,Hypomobility,Dizziness,Difficulty walking,Impaired flexibility,Postural dysfunction,Pain  Visit Diagnosis: Other abnormalities of gait and mobility  Muscle weakness (generalized)  Chronic bilateral low back pain without sciatica  Unsteadiness on feet     Problem List Patient Active Problem List   Diagnosis Date Noted  . S/P lumbar fusion 06/18/2020  . COPD with chronic bronchitis ([place]) 09/28/2018  . Essential hypertension 07/10/2018  . IFG (impaired fasting glucose) 11/10/2017  . DJD (degenerative joint disease), lumbosacral 11/10/2017  . Fibromyalgia 11/10/2017  . Dyshidrotic eczema 11/10/2017  . GERD (gastroesophageal reflux disease) 11/10/2017  . Osteopenia 11/10/2017  . Does use hearing aid 11/10/2017  . 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  . Fatty liver 11/10/2017  . Coronary artery disease   . Peripheral vascular disease with claudication ([place])   . 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  . History of abdominal aortic aneurysm repair 05/18/2011  . History of renal artery stenosis 05/18/2011  . Mixed hyperlipidemia     Kerrie Pleasure, PT 11/25/2020, 3:37 PM  [facility] [address] [place], [place], [address] Phone: [phone]   Fax:  [phone]  Name: Megan [name] MRN: [id number] Date of Birth: 1946-07-17

## 2020-11-27 DIAGNOSIS — M5416 Radiculopathy, lumbar region: Secondary | ICD-10-CM | POA: Diagnosis not present

## 2020-12-02 ENCOUNTER — Ambulatory Visit: Payer: Medicare Other

## 2020-12-02 ENCOUNTER — Other Ambulatory Visit: Payer: Self-pay

## 2020-12-02 DIAGNOSIS — R2681 Unsteadiness on feet: Secondary | ICD-10-CM | POA: Diagnosis not present

## 2020-12-02 DIAGNOSIS — M545 Low back pain, unspecified: Secondary | ICD-10-CM

## 2020-12-02 DIAGNOSIS — G8929 Other chronic pain: Secondary | ICD-10-CM | POA: Diagnosis not present

## 2020-12-02 DIAGNOSIS — R2689 Other abnormalities of gait and mobility: Secondary | ICD-10-CM | POA: Diagnosis not present

## 2020-12-02 DIAGNOSIS — M6281 Muscle weakness (generalized): Secondary | ICD-10-CM

## 2020-12-02 NOTE — Therapy (Signed)
[facility] [address] [place], [place], [address] Phone: [phone]   Fax:  [phone]  Physical Therapy Discharge [place]  Patient Details  Name: Megan [name] MRN: [id number] Date of Birth: [birth year] Referring Provider (PT): Glenford Peers, NP   Encounter Date: 12/02/2020   PT End of Session - 12/02/20 1815    Visit Number 17    Number of Visits 17    Date for PT Re-Evaluation 10/16/20    Authorization Type Medicare/Medicaid    Progress Note Due on Visit 10    PT Start Time 1530    PT Stop Time 1620    PT Time Calculation (min) 50 min    Equipment Utilized During Treatment Back brace    Activity Tolerance Patient tolerated treatment well    Behavior During Therapy [facility] for tasks assessed/performed           Past Medical History:  Diagnosis Date  . AAA (abdominal aortic aneurysm) ([place])    a. s/p stent grafting in 2009.  . Adenomatous colon polyp 05/2001  . Adenomatous duodenal polyp   . Allergy   . Anginal pain ([place])   . COPD (chronic obstructive pulmonary disease) ([place])    pt denies COPD  . Coronary artery disease    a. s/p CABG in 1995;  b. 05/2013 Neg MV, EF 82%;  c. 06/2013 Cath: LM nl, LAD 40-50p, LCX nl, RCA 80p/194m, VG->RCA->PDA->PLSA min irregs, LIMA->LAD atretic, vigorous LV fxn.  . Diabetes mellitus without complication (HCC)    history of, resolved. 2017  . Diverticulosis   . Eczema, dyshidrotic   . Eczema, dyshidrotic 1986   Dr. Mathis Fare  . Emphysema of lung ([place])   . Fatty liver   . Fibromyalgia   . GERD (gastroesophageal reflux disease)   . Hyperlipidemia   . Hypertension   . Migraine   . Myocardial infarction ([place])   . Osteoarthritis   . Osteoporosis    unsure, having a bone scan soon  . Peripheral arterial disease (HCC)    left leg diagnosed by Dr Mare Ferrari  . Renal artery stenosis ([place])    a. 2008 s/p PTA    Past Surgical History:  Procedure Laterality Date   . ABDOMINAL AORTAGRAM N/A 03/06/2014   Procedure: ABDOMINAL AORTAGRAM;  Surgeon: Rosetta Posner, MD;  Location[facility] CATH LAB;  Service: Cardiovascular;  Laterality: N/A;  . ABDOMINAL AORTIC ANEURYSM REPAIR  2009   stenting  . ABDOMINAL HYSTERECTOMY     Total, 1992  . BREAST BIOPSY    . CARDIAC CATHETERIZATION    . CARDIOVASCULAR STRESS TEST  11/11/2009   normal study  . CARPAL TUNNEL RELEASE  08/1993   left wrist  . CARPAL TUNNEL RELEASE  01/1997   right  . CATARACT EXTRACTION, BILATERAL  2021  . CORONARY ARTERY BYPASS GRAFT  07/1994   Triple by Dr Ceasar Mons  . dental implants    . EYE SURGERY  03/2020   cataract  . HAND SURGERY Right 09/2002   Right hand pulley release  . LEFT HEART CATHETERIZATION WITH CORONARY/GRAFT ANGIOGRAM N/A 06/11/2013   Procedure: LEFT HEART CATHETERIZATION WITH [name];  Surgeon: Thayer Headings, MD;  Location: [facility] CATH LAB;  Service: Cardiovascular;  Laterality: N/A;  . MULTIPLE TOOTH EXTRACTIONS  2000   All upper teeth.  Has full dneture.   [name] RENAL ARTERY STENT  2008  . SHOULDER ARTHROSCOPY WITH ROTATOR CUFF REPAIR Right 09/27/2018   Procedure: Right  shoulder mini open rotator cuff repair;  Surgeon: Susa Day, MD;  Location[facility]S;  Service: Orthopedics;  Laterality: Right;  90 mins  . thyroid cyst apiration  05/1997 and 07/1998  . TONSILECTOMY, ADENOIDECTOMY, BILATERAL MYRINGOTOMY AND TUBES  1989  . TONSILLECTOMY    . TOTAL ABDOMINAL HYSTERECTOMY  1992    There were no vitals filed for this visit.   Subjective Assessment - 12/02/20 1814    Subjective Symptoms are stable. She brought 2 handle trnasfer bar.    Pertinent History AAA, angina, COPD, CAD s/p CABG, DM, diverticulosis, fibromyalgia, HTN, migraine, MI, OA, PAD, Renal artery stenosis. back pain    Limitations Standing;Lifting;Walking;House hold activities    How long can you sit comfortably? no limits    How long can you stand comfortably? 5-10 min    How long can you walk  comfortably? 5-10 min    Diagnostic tests MRI: L3 compression fracture per pt report    Pain Onset More than a month ago              PT educated on how double handlebar is not safe to transfer her husband. Her hsuband doesn't have a strong grip (per patient) so if he lets go of his handle, it will make patient fall backwards or vice versa. Also, physically it is more strain on patient's back as the weight of her husband is further away from her. Pt educated on using gait belt and being close to her husband with trnasfering.  Pt reported that in the morning is the worst time for her as she has to perfrom lot of bending to get her husband ready. We discussed ways where she has to perform less bending. Instead of don/doffing pants and underwear while her husband is sitting on toilet, she should do that on bed where he is lying down and she doesn't have to bend as much. Asking her husband to perform most of his tasks will improve his independence and reduce caregiver burden on her. Also demonstrated to patient on how to position herself infront of her husand to improve her body mechanics to reduce strain on her back.  Reviewed exercises verbally for final HEP and written copy given to patient.                         PT Short Term Goals - 12/02/20 1815      PT SHORT TERM GOAL #1   Title Patient will be compliant with walking 10 min for 4x/week to initiate walking program    Baseline unable to walk 1/4 mile    Time 4    Period Weeks    Status Achieved    Target Date 09/18/20      PT SHORT TERM GOAL #2   Title Patient will report <5/10 pain in her back with ADLs to improve function at home    Baseline 7/10 constant (eval)    Time 4    Period Weeks    Status Achieved    Target Date 09/18/20             PT Long Term Goals - 12/02/20 1815      PT LONG TERM GOAL #1   Title Patient will demo 22/24 on DGI to improve balance with ambulation    Baseline 16/24  (eval)    Time 8    Period Weeks    Status Not Met      PT LONG  TERM GOAL #2   Title Patient will demo 5x sit to stand without HHA under 17 seconds to improve functional strength    Baseline 25 seconds no HHA; 14 seconds 09/19/20    Time 8    Period Weeks    Status Achieved      PT LONG TERM GOAL #3   Title Patient will report <3/10 pain in her loewr back with functional activities to improve overall function    Baseline 7/10 constant pain (eval); 5/10 pm 09/19/20; remains 5/10 to 8/10 throughout the day (10/07/20)    Time 8    Period Weeks    Status Not Met                 Plan - 12/02/20 1815    Clinical Impression Statement Patient has been seen for total of 17 sessions from 08/21/20 to 12/02/20 for LBP and balance disorder. Patient has met all of her short term goals and 2/4 long term goals. Patient has reached maximum potential from skilled physical therapy and will be discharged from skilled PT with independent home exercise program to continue to manage her symptoms.    Personal Factors and Comorbidities Comorbidity 3+    Comorbidities AAA, angina, COPD, CAD s/p CABG, DM, diverticulosis, fibromyalgia, HTN, migraine, MI, OA, PAD, Renal artery stenosis. back pain    Examination-Activity Limitations Bathing;Bend;Caring for Others;Carry;Lift;Stairs;Stand;Squat;Sleep;Transfers    Examination-Participation Restrictions Cleaning;Community Activity;Driving;Laundry;Yard Work;Shop;Meal Prep    Stability/Clinical Decision Making Evolving/Moderate complexity    Rehab Potential Good    PT Frequency 2x / week    PT Duration 8 weeks    PT Treatment/Interventions ADLs/Self Care Home Management;Moist Heat;Neuromuscular re-education;Balance training;Therapeutic exercise;Therapeutic activities;Functional mobility training;Stair training;Gait training;Patient/family education;Manual techniques;Scar mobilization;Passive range of motion;Energy conservation;Joint Manipulations;Spinal  Manipulations;Vestibular    PT Next Visit Plan Discharge    PT Home Exercise Plan Access Code [email or web address]    Consulted and Agree with Plan of Care Patient           Patient will benefit from skilled therapeutic intervention in order to improve the following deficits and impairments:  Abnormal gait,Decreased activity tolerance,Decreased balance,Decreased mobility,Decreased endurance,Decreased range of motion,Decreased strength,Hypomobility,Dizziness,Difficulty walking,Impaired flexibility,Postural dysfunction,Pain  Visit Diagnosis: Other abnormalities of gait and mobility  Muscle weakness (generalized)  Chronic bilateral low back pain without sciatica  Unsteadiness on feet     Problem List Patient Active Problem List   Diagnosis Date Noted  . S/P lumbar fusion 06/18/2020  . COPD with chronic bronchitis ([place]) 09/28/2018  . Essential hypertension 07/10/2018  . IFG (impaired fasting glucose) 11/10/2017  . DJD (degenerative joint disease), lumbosacral 11/10/2017  . Fibromyalgia 11/10/2017  . Dyshidrotic eczema 11/10/2017  . GERD (gastroesophageal reflux disease) 11/10/2017  . Osteopenia 11/10/2017  . Does use hearing aid 11/10/2017  . 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  . Fatty liver 11/10/2017  . Coronary artery disease   . Peripheral vascular disease with claudication ([place])   . 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  . History of abdominal aortic aneurysm repair 05/18/2011  . History of renal artery stenosis 05/18/2011  . Mixed hyperlipidemia     Kerrie Pleasure 12/02/2020, 6:24 PM  [facility] [address] [place], [place], [address] Phone: [phone]   Fax:  [phone]  Name: Megan [name] MRN: [id number] Date of Birth: [birth year]

## 2020-12-05 DIAGNOSIS — M792 Neuralgia and neuritis, unspecified: Secondary | ICD-10-CM | POA: Insufficient documentation

## 2020-12-15 ENCOUNTER — Telehealth: Payer: Self-pay | Admitting: Gastroenterology

## 2020-12-15 MED ORDER — OMEPRAZOLE 40 MG PO CPDR: DELAYED_RELEASE_CAPSULE | ORAL | 0 refills | Status: DC

## 2020-12-15 NOTE — Telephone Encounter (Signed)
Inbound call from patient requesting refills for omeprazole 40mg  to be sent to [facility] in chart please.  Patient only has 3 days left of medication.

## 2020-12-15 NOTE — Telephone Encounter (Signed)
Prescription sent to patient's pharmacy and notified to schedule an appt for further refills.

## 2020-12-16 DIAGNOSIS — M792 Neuralgia and neuritis, unspecified: Secondary | ICD-10-CM | POA: Diagnosis not present

## 2020-12-30 ENCOUNTER — Other Ambulatory Visit: Payer: Self-pay | Admitting: Neurological Surgery

## 2020-12-30 ENCOUNTER — Telehealth: Payer: Self-pay

## 2020-12-30 DIAGNOSIS — M48061 Spinal stenosis, lumbar region without neurogenic claudication: Secondary | ICD-10-CM

## 2020-12-30 NOTE — Telephone Encounter (Signed)
Phone call to patient to verify medication list and allergies for myelogram procedure. Medications pt is currently taking are safe to continue to take. Advised pt if any new medications are started prior to procedure to call and make [place] aware. Pt also instructed to have a driver the day of the procedure, she would be with [place] around 2 hours the day of, and discharge instructions reviewed. Pt verbalized understanding. Pt also takes xanax PRN at home and reports she would rather take that than the Valium we would give, prior to the myelogram procedure.

## 2021-01-01 ENCOUNTER — Telehealth: Payer: Self-pay

## 2021-01-01 NOTE — Telephone Encounter (Signed)
Pt called and reported she has decided not to take her Xanax the am of her procedure and allow [place] to give her Valium instead.

## 2021-01-05 ENCOUNTER — Encounter (HOSPITAL_COMMUNITY): Payer: Self-pay | Admitting: *Deleted

## 2021-01-05 ENCOUNTER — Inpatient Hospital Stay (HOSPITAL_COMMUNITY)
Admission: EM | Admit: 2021-01-05 | Discharge: 2021-01-13 | DRG: 682 | Disposition: A | Discharge status: Home Health | Payer: Medicare Other | Attending: Internal Medicine | Admitting: Internal Medicine

## 2021-01-05 ENCOUNTER — Encounter: Payer: Self-pay | Admitting: Family Medicine

## 2021-01-05 ENCOUNTER — Other Ambulatory Visit: Payer: Self-pay

## 2021-01-05 DIAGNOSIS — R011 Cardiac murmur, unspecified: Secondary | ICD-10-CM | POA: Diagnosis not present

## 2021-01-05 DIAGNOSIS — R197 Diarrhea, unspecified: Secondary | ICD-10-CM | POA: Diagnosis not present

## 2021-01-05 DIAGNOSIS — E871 Hypo-osmolality and hyponatremia: Secondary | ICD-10-CM | POA: Diagnosis present

## 2021-01-05 DIAGNOSIS — M810 Age-related osteoporosis without current pathological fracture: Secondary | ICD-10-CM | POA: Diagnosis not present

## 2021-01-05 DIAGNOSIS — R0902 Hypoxemia: Secondary | ICD-10-CM

## 2021-01-05 DIAGNOSIS — E876 Hypokalemia: Secondary | ICD-10-CM | POA: Diagnosis not present

## 2021-01-05 DIAGNOSIS — E1151 Type 2 diabetes mellitus with diabetic peripheral angiopathy without gangrene: Secondary | ICD-10-CM | POA: Diagnosis not present

## 2021-01-05 DIAGNOSIS — Z981 Arthrodesis status: Secondary | ICD-10-CM

## 2021-01-05 DIAGNOSIS — Z79899 Other long term (current) drug therapy: Secondary | ICD-10-CM

## 2021-01-05 DIAGNOSIS — N179 Acute kidney failure, unspecified: Principal | ICD-10-CM | POA: Diagnosis present

## 2021-01-05 DIAGNOSIS — E118 Type 2 diabetes mellitus with unspecified complications: Secondary | ICD-10-CM | POA: Diagnosis not present

## 2021-01-05 DIAGNOSIS — I714 Abdominal aortic aneurysm, without rupture: Secondary | ICD-10-CM | POA: Diagnosis not present

## 2021-01-05 DIAGNOSIS — M1990 Unspecified osteoarthritis, unspecified site: Secondary | ICD-10-CM | POA: Diagnosis present

## 2021-01-05 DIAGNOSIS — R933 Abnormal findings on diagnostic imaging of other parts of digestive tract: Secondary | ICD-10-CM | POA: Diagnosis not present

## 2021-01-05 DIAGNOSIS — J439 Emphysema, unspecified: Secondary | ICD-10-CM | POA: Diagnosis not present

## 2021-01-05 DIAGNOSIS — N390 Urinary tract infection, site not specified: Secondary | ICD-10-CM | POA: Diagnosis present

## 2021-01-05 DIAGNOSIS — Z800 Family history of malignant neoplasm of digestive organs: Secondary | ICD-10-CM

## 2021-01-05 DIAGNOSIS — I252 Old myocardial infarction: Secondary | ICD-10-CM

## 2021-01-05 DIAGNOSIS — E782 Mixed hyperlipidemia: Secondary | ICD-10-CM | POA: Diagnosis not present

## 2021-01-05 DIAGNOSIS — R066 Hiccough: Secondary | ICD-10-CM | POA: Diagnosis not present

## 2021-01-05 DIAGNOSIS — Z8679 Personal history of other diseases of the circulatory system: Secondary | ICD-10-CM

## 2021-01-05 DIAGNOSIS — D1779 Benign lipomatous neoplasm of other sites: Secondary | ICD-10-CM | POA: Diagnosis not present

## 2021-01-05 DIAGNOSIS — K5732 Diverticulitis of large intestine without perforation or abscess without bleeding: Secondary | ICD-10-CM | POA: Diagnosis present

## 2021-01-05 DIAGNOSIS — L309 Dermatitis, unspecified: Secondary | ICD-10-CM | POA: Diagnosis not present

## 2021-01-05 DIAGNOSIS — G9341 Metabolic encephalopathy: Secondary | ICD-10-CM | POA: Diagnosis not present

## 2021-01-05 DIAGNOSIS — I110 Hypertensive heart disease with heart failure: Secondary | ICD-10-CM | POA: Diagnosis not present

## 2021-01-05 DIAGNOSIS — K760 Fatty (change of) liver, not elsewhere classified: Secondary | ICD-10-CM | POA: Diagnosis present

## 2021-01-05 DIAGNOSIS — B9620 Unspecified Escherichia coli [E. coli] as the cause of diseases classified elsewhere: Secondary | ICD-10-CM | POA: Diagnosis present

## 2021-01-05 DIAGNOSIS — I10 Essential (primary) hypertension: Secondary | ICD-10-CM | POA: Diagnosis not present

## 2021-01-05 DIAGNOSIS — N200 Calculus of kidney: Secondary | ICD-10-CM | POA: Diagnosis not present

## 2021-01-05 DIAGNOSIS — B370 Candidal stomatitis: Secondary | ICD-10-CM | POA: Diagnosis not present

## 2021-01-05 DIAGNOSIS — R42 Dizziness and giddiness: Secondary | ICD-10-CM | POA: Diagnosis not present

## 2021-01-05 DIAGNOSIS — R0602 Shortness of breath: Secondary | ICD-10-CM

## 2021-01-05 DIAGNOSIS — I5033 Acute on chronic diastolic (congestive) heart failure: Secondary | ICD-10-CM | POA: Diagnosis present

## 2021-01-05 DIAGNOSIS — D72829 Elevated white blood cell count, unspecified: Secondary | ICD-10-CM | POA: Diagnosis present

## 2021-01-05 DIAGNOSIS — J9601 Acute respiratory failure with hypoxia: Secondary | ICD-10-CM | POA: Diagnosis not present

## 2021-01-05 DIAGNOSIS — J9811 Atelectasis: Secondary | ICD-10-CM | POA: Diagnosis not present

## 2021-01-05 DIAGNOSIS — Z833 Family history of diabetes mellitus: Secondary | ICD-10-CM

## 2021-01-05 DIAGNOSIS — R531 Weakness: Secondary | ICD-10-CM | POA: Diagnosis not present

## 2021-01-05 DIAGNOSIS — I2510 Atherosclerotic heart disease of native coronary artery without angina pectoris: Secondary | ICD-10-CM | POA: Diagnosis present

## 2021-01-05 DIAGNOSIS — E860 Dehydration: Secondary | ICD-10-CM | POA: Diagnosis not present

## 2021-01-05 DIAGNOSIS — I6782 Cerebral ischemia: Secondary | ICD-10-CM | POA: Diagnosis not present

## 2021-01-05 DIAGNOSIS — R5381 Other malaise: Secondary | ICD-10-CM | POA: Diagnosis not present

## 2021-01-05 DIAGNOSIS — Z20822 Contact with and (suspected) exposure to covid-19: Secondary | ICD-10-CM | POA: Diagnosis present

## 2021-01-05 DIAGNOSIS — Z803 Family history of malignant neoplasm of breast: Secondary | ICD-10-CM

## 2021-01-05 DIAGNOSIS — M797 Fibromyalgia: Secondary | ICD-10-CM | POA: Diagnosis present

## 2021-01-05 DIAGNOSIS — D170 Benign lipomatous neoplasm of skin and subcutaneous tissue of head, face and neck: Secondary | ICD-10-CM | POA: Diagnosis not present

## 2021-01-05 DIAGNOSIS — R29818 Other symptoms and signs involving the nervous system: Secondary | ICD-10-CM | POA: Diagnosis not present

## 2021-01-05 DIAGNOSIS — J90 Pleural effusion, not elsewhere classified: Secondary | ICD-10-CM | POA: Diagnosis not present

## 2021-01-05 DIAGNOSIS — K219 Gastro-esophageal reflux disease without esophagitis: Secondary | ICD-10-CM | POA: Diagnosis not present

## 2021-01-05 DIAGNOSIS — Z87891 Personal history of nicotine dependence: Secondary | ICD-10-CM

## 2021-01-05 DIAGNOSIS — S06350A Traumatic hemorrhage of left cerebrum without loss of consciousness, initial encounter: Secondary | ICD-10-CM | POA: Diagnosis not present

## 2021-01-05 DIAGNOSIS — Z885 Allergy status to narcotic agent status: Secondary | ICD-10-CM

## 2021-01-05 DIAGNOSIS — Z83438 Family history of other disorder of lipoprotein metabolism and other lipidemia: Secondary | ICD-10-CM

## 2021-01-05 DIAGNOSIS — Z951 Presence of aortocoronary bypass graft: Secondary | ICD-10-CM

## 2021-01-05 DIAGNOSIS — N170 Acute kidney failure with tubular necrosis: Secondary | ICD-10-CM | POA: Diagnosis not present

## 2021-01-05 DIAGNOSIS — Z8249 Family history of ischemic heart disease and other diseases of the circulatory system: Secondary | ICD-10-CM

## 2021-01-05 DIAGNOSIS — E119 Type 2 diabetes mellitus without complications: Secondary | ICD-10-CM

## 2021-01-05 DIAGNOSIS — Z888 Allergy status to other drugs, medicaments and biological substances status: Secondary | ICD-10-CM

## 2021-01-05 MED ORDER — ONDANSETRON 4 MG PO TBDP
4.0000 mg | ORAL_TABLET | Freq: Once | ORAL | Status: AC | PRN
  Administered 2021-01-06: 4 mg via ORAL
  Filled 2021-01-05: qty 1

## 2021-01-05 NOTE — ED Triage Notes (Signed)
Pt arrived by ems from home. Reports having diarrhea and nausea x 4 days.states that tonight she is having problems "comprehending" what people are saying to her. She was able to answer all questions at triage with no difficulty.

## 2021-01-06 ENCOUNTER — Inpatient Hospital Stay (HOSPITAL_COMMUNITY): Payer: Medicare Other

## 2021-01-06 ENCOUNTER — Telehealth: Payer: Medicare Other | Admitting: Family Medicine

## 2021-01-06 DIAGNOSIS — D72829 Elevated white blood cell count, unspecified: Secondary | ICD-10-CM | POA: Diagnosis not present

## 2021-01-06 DIAGNOSIS — E871 Hypo-osmolality and hyponatremia: Secondary | ICD-10-CM | POA: Diagnosis present

## 2021-01-06 DIAGNOSIS — K5732 Diverticulitis of large intestine without perforation or abscess without bleeding: Secondary | ICD-10-CM | POA: Diagnosis not present

## 2021-01-06 DIAGNOSIS — Z981 Arthrodesis status: Secondary | ICD-10-CM | POA: Diagnosis not present

## 2021-01-06 DIAGNOSIS — N170 Acute kidney failure with tubular necrosis: Secondary | ICD-10-CM | POA: Diagnosis not present

## 2021-01-06 DIAGNOSIS — K219 Gastro-esophageal reflux disease without esophagitis: Secondary | ICD-10-CM | POA: Diagnosis present

## 2021-01-06 DIAGNOSIS — E118 Type 2 diabetes mellitus with unspecified complications: Secondary | ICD-10-CM | POA: Diagnosis not present

## 2021-01-06 DIAGNOSIS — E1151 Type 2 diabetes mellitus with diabetic peripheral angiopathy without gangrene: Secondary | ICD-10-CM | POA: Diagnosis present

## 2021-01-06 DIAGNOSIS — R197 Diarrhea, unspecified: Secondary | ICD-10-CM | POA: Diagnosis not present

## 2021-01-06 DIAGNOSIS — G9341 Metabolic encephalopathy: Secondary | ICD-10-CM | POA: Diagnosis present

## 2021-01-06 DIAGNOSIS — N390 Urinary tract infection, site not specified: Secondary | ICD-10-CM | POA: Diagnosis present

## 2021-01-06 DIAGNOSIS — E860 Dehydration: Secondary | ICD-10-CM | POA: Diagnosis present

## 2021-01-06 DIAGNOSIS — J439 Emphysema, unspecified: Secondary | ICD-10-CM | POA: Diagnosis present

## 2021-01-06 DIAGNOSIS — E876 Hypokalemia: Secondary | ICD-10-CM | POA: Diagnosis not present

## 2021-01-06 DIAGNOSIS — B9620 Unspecified Escherichia coli [E. coli] as the cause of diseases classified elsewhere: Secondary | ICD-10-CM | POA: Diagnosis present

## 2021-01-06 DIAGNOSIS — M810 Age-related osteoporosis without current pathological fracture: Secondary | ICD-10-CM | POA: Diagnosis present

## 2021-01-06 DIAGNOSIS — L309 Dermatitis, unspecified: Secondary | ICD-10-CM | POA: Diagnosis present

## 2021-01-06 DIAGNOSIS — I5033 Acute on chronic diastolic (congestive) heart failure: Secondary | ICD-10-CM | POA: Diagnosis present

## 2021-01-06 DIAGNOSIS — E782 Mixed hyperlipidemia: Secondary | ICD-10-CM | POA: Diagnosis present

## 2021-01-06 DIAGNOSIS — J9601 Acute respiratory failure with hypoxia: Secondary | ICD-10-CM | POA: Diagnosis not present

## 2021-01-06 DIAGNOSIS — I2510 Atherosclerotic heart disease of native coronary artery without angina pectoris: Secondary | ICD-10-CM | POA: Diagnosis present

## 2021-01-06 DIAGNOSIS — N179 Acute kidney failure, unspecified: Principal | ICD-10-CM

## 2021-01-06 DIAGNOSIS — M797 Fibromyalgia: Secondary | ICD-10-CM | POA: Diagnosis present

## 2021-01-06 DIAGNOSIS — R011 Cardiac murmur, unspecified: Secondary | ICD-10-CM | POA: Diagnosis not present

## 2021-01-06 DIAGNOSIS — K760 Fatty (change of) liver, not elsewhere classified: Secondary | ICD-10-CM | POA: Diagnosis present

## 2021-01-06 DIAGNOSIS — R933 Abnormal findings on diagnostic imaging of other parts of digestive tract: Secondary | ICD-10-CM | POA: Diagnosis not present

## 2021-01-06 DIAGNOSIS — M1990 Unspecified osteoarthritis, unspecified site: Secondary | ICD-10-CM | POA: Diagnosis present

## 2021-01-06 DIAGNOSIS — I110 Hypertensive heart disease with heart failure: Secondary | ICD-10-CM | POA: Diagnosis present

## 2021-01-06 DIAGNOSIS — I10 Essential (primary) hypertension: Secondary | ICD-10-CM

## 2021-01-06 DIAGNOSIS — B370 Candidal stomatitis: Secondary | ICD-10-CM | POA: Diagnosis not present

## 2021-01-06 DIAGNOSIS — Z20822 Contact with and (suspected) exposure to covid-19: Secondary | ICD-10-CM | POA: Diagnosis present

## 2021-01-06 LAB — URINALYSIS, ROUTINE W REFLEX MICROSCOPIC
Bilirubin Urine: NEGATIVE
Glucose, UA: NEGATIVE mg/dL
Ketones, ur: NEGATIVE mg/dL
Nitrite: NEGATIVE
Protein, ur: 30 mg/dL — AB
Specific Gravity, Urine: 1.012 (ref 1.005–1.030)
pH: 5 (ref 5.0–8.0)

## 2021-01-06 LAB — COMPREHENSIVE METABOLIC PANEL
ALT: 22 U/L (ref 0–44)
AST: 36 U/L (ref 15–41)
Albumin: 2.9 g/dL — ABNORMAL LOW (ref 3.5–5.0)
Alkaline Phosphatase: 67 U/L (ref 38–126)
Anion gap: 14 (ref 5–15)
BUN: 29 mg/dL — ABNORMAL HIGH (ref 8–23)
CO2: 21 mmol/L — ABNORMAL LOW (ref 22–32)
Calcium: 9.5 mg/dL (ref 8.9–10.3)
Chloride: 95 mmol/L — ABNORMAL LOW (ref 98–111)
Creatinine, Ser: 3.29 mg/dL — ABNORMAL HIGH (ref 0.44–1.00)
GFR, Estimated: 14 mL/min — ABNORMAL LOW (ref >60)
Glucose, Bld: 151 mg/dL — ABNORMAL HIGH (ref 70–99)
Potassium: 3.6 mmol/L (ref 3.5–5.1)
Sodium: 130 mmol/L — ABNORMAL LOW (ref 135–145)
Total Bilirubin: 1 mg/dL (ref 0.3–1.2)
Total Protein: 5.8 g/dL — ABNORMAL LOW (ref 6.5–8.1)

## 2021-01-06 LAB — CBC
HCT: 38.8 % (ref 36.0–46.0)
Hemoglobin: 13.2 g/dL (ref 12.0–15.0)
MCH: 29.7 pg (ref 26.0–34.0)
MCHC: 34 g/dL (ref 30.0–36.0)
MCV: 87.4 fL (ref 80.0–100.0)
Platelets: 212 10*3/uL (ref 150–400)
RBC: 4.44 MIL/uL (ref 3.87–5.11)
RDW: 12.5 % (ref 11.5–15.5)
WBC: 14.3 10*3/uL — ABNORMAL HIGH (ref 4.0–10.5)
nRBC: 0 % (ref 0.0–0.2)

## 2021-01-06 LAB — C DIFFICILE QUICK SCREEN W PCR REFLEX
C Diff antigen: NEGATIVE
C Diff interpretation: NOT DETECTED
C Diff toxin: NEGATIVE

## 2021-01-06 LAB — SARS CORONAVIRUS 2 BY RT PCR (HOSPITAL ORDER, PERFORMED IN ~~LOC~~ HOSPITAL LAB): SARS Coronavirus 2: NEGATIVE

## 2021-01-06 LAB — LIPASE, BLOOD: Lipase: 43 U/L (ref 11–51)

## 2021-01-06 LAB — MAGNESIUM: Magnesium: 1.6 mg/dL — ABNORMAL LOW (ref 1.7–2.4)

## 2021-01-06 MED ORDER — SODIUM CHLORIDE 0.9 % IV SOLN: INTRAVENOUS | Status: DC

## 2021-01-06 MED ORDER — ATORVASTATIN CALCIUM 80 MG PO TABS
80.0000 mg | ORAL_TABLET | Freq: Every day | ORAL | Status: DC
  Administered 2021-01-06 – 2021-01-13 (×8): 80 mg via ORAL
  Filled 2021-01-06 (×3): qty 1
  Filled 2021-01-06: qty 2
  Filled 2021-01-06 (×4): qty 1

## 2021-01-06 MED ORDER — TRETINOIN 0.1 % EX CREA: 1.0000 "application " | TOPICAL_CREAM | CUTANEOUS | Status: DC

## 2021-01-06 MED ORDER — ACETAMINOPHEN 325 MG PO TABS
650.0000 mg | ORAL_TABLET | Freq: Four times a day (QID) | ORAL | Status: DC | PRN
  Administered 2021-01-07 – 2021-01-13 (×7): 650 mg via ORAL
  Filled 2021-01-06 (×8): qty 2

## 2021-01-06 MED ORDER — PANTOPRAZOLE SODIUM 40 MG PO TBEC
40.0000 mg | DELAYED_RELEASE_TABLET | Freq: Every day | ORAL | Status: DC
  Administered 2021-01-06 – 2021-01-13 (×8): 40 mg via ORAL
  Filled 2021-01-06 (×8): qty 1

## 2021-01-06 MED ORDER — CYCLOSPORINE 0.05 % OP EMUL
1.0000 [drp] | Freq: Two times a day (BID) | OPHTHALMIC | Status: DC
  Administered 2021-01-06 – 2021-01-13 (×14): 1 [drp] via OPHTHALMIC
  Filled 2021-01-06 (×16): qty 1

## 2021-01-06 MED ORDER — ONDANSETRON HCL 4 MG/2ML IJ SOLN
4.0000 mg | Freq: Four times a day (QID) | INTRAMUSCULAR | Status: DC | PRN
  Administered 2021-01-06 – 2021-01-13 (×7): 4 mg via INTRAVENOUS
  Filled 2021-01-06 (×7): qty 2

## 2021-01-06 MED ORDER — ONDANSETRON HCL 4 MG PO TABS
4.0000 mg | ORAL_TABLET | Freq: Four times a day (QID) | ORAL | Status: DC | PRN
  Administered 2021-01-11: 4 mg via ORAL
  Filled 2021-01-06: qty 1

## 2021-01-06 MED ORDER — OXYCODONE HCL 5 MG PO TABS
10.0000 mg | ORAL_TABLET | Freq: Every day | ORAL | Status: DC | PRN
  Administered 2021-01-06 – 2021-01-08 (×2): 10 mg via ORAL
  Filled 2021-01-06 (×3): qty 2

## 2021-01-06 MED ORDER — CICLOPIROX OLAMINE 0.77 % EX CREA: 1.0000 "application " | TOPICAL_CREAM | Freq: Every day | CUTANEOUS | Status: DC | PRN

## 2021-01-06 MED ORDER — AMLODIPINE BESYLATE 5 MG PO TABS
5.0000 mg | ORAL_TABLET | Freq: Every day | ORAL | Status: DC
  Administered 2021-01-07 – 2021-01-08 (×2): 5 mg via ORAL
  Filled 2021-01-06 (×2): qty 1

## 2021-01-06 MED ORDER — ALPRAZOLAM 0.25 MG PO TABS: 0.2500 mg | ORAL_TABLET | Freq: Every evening | ORAL | Status: DC | PRN

## 2021-01-06 MED ORDER — SODIUM CHLORIDE 0.9 % IV SOLN
2.0000 g | INTRAVENOUS | Status: DC
  Administered 2021-01-06 – 2021-01-07 (×2): 2 g via INTRAVENOUS
  Filled 2021-01-06 (×3): qty 20

## 2021-01-06 MED ORDER — METOCLOPRAMIDE HCL 5 MG/ML IJ SOLN
5.0000 mg | Freq: Once | INTRAMUSCULAR | Status: AC
  Administered 2021-01-06: 5 mg via INTRAVENOUS
  Filled 2021-01-06: qty 2

## 2021-01-06 MED ORDER — ACETAMINOPHEN 650 MG RE SUPP: 650.0000 mg | Freq: Four times a day (QID) | RECTAL | Status: DC | PRN

## 2021-01-06 MED ORDER — ACETAMINOPHEN 325 MG PO TABS: 325.0000 mg | ORAL_TABLET | Freq: Every day | ORAL | Status: DC | PRN

## 2021-01-06 MED ORDER — FLURANDRENOLIDE 4 MCG/SQCM EX TAPE: 1.0000 | MEDICATED_TAPE | Freq: Every day | CUTANEOUS | Status: DC | PRN

## 2021-01-06 MED ORDER — HYDRALAZINE HCL 10 MG PO TABS
10.0000 mg | ORAL_TABLET | Freq: Two times a day (BID) | ORAL | Status: DC
  Administered 2021-01-06 – 2021-01-08 (×5): 10 mg via ORAL
  Filled 2021-01-06 (×5): qty 1

## 2021-01-06 MED ORDER — SODIUM CHLORIDE 0.9% FLUSH
3.0000 mL | Freq: Two times a day (BID) | INTRAVENOUS | Status: DC
  Administered 2021-01-07 – 2021-01-13 (×11): 3 mL via INTRAVENOUS

## 2021-01-06 MED ORDER — ASPIRIN EC 81 MG PO TBEC
81.0000 mg | DELAYED_RELEASE_TABLET | Freq: Every day | ORAL | Status: DC
Start: 2021-01-06 — End: 2021-01-13
  Administered 2021-01-06 – 2021-01-13 (×8): 81 mg via ORAL
  Filled 2021-01-06 (×8): qty 1

## 2021-01-06 MED ORDER — OXYCODONE-ACETAMINOPHEN 10-325 MG PO TABS: 1.0000 | ORAL_TABLET | Freq: Every day | ORAL | Status: DC | PRN

## 2021-01-06 MED ORDER — LOPERAMIDE HCL 2 MG PO CAPS
4.0000 mg | ORAL_CAPSULE | ORAL | Status: DC | PRN
  Administered 2021-01-07 – 2021-01-11 (×9): 4 mg via ORAL
  Filled 2021-01-06 (×9): qty 2

## 2021-01-06 MED ORDER — HEPARIN SODIUM (PORCINE) 5000 UNIT/ML IJ SOLN
5000.0000 [IU] | Freq: Three times a day (TID) | INTRAMUSCULAR | Status: DC
  Administered 2021-01-06 – 2021-01-13 (×20): 5000 [IU] via SUBCUTANEOUS
  Filled 2021-01-06 (×21): qty 1

## 2021-01-06 MED ORDER — UMECLIDINIUM BROMIDE 62.5 MCG/INH IN AEPB
1.0000 | INHALATION_SPRAY | Freq: Every day | RESPIRATORY_TRACT | Status: DC
  Administered 2021-01-07 – 2021-01-11 (×5): 1 via RESPIRATORY_TRACT
  Filled 2021-01-06: qty 7

## 2021-01-06 MED ORDER — MAGNESIUM SULFATE 2 GM/50ML IV SOLN
2.0000 g | Freq: Once | INTRAVENOUS | Status: AC
  Administered 2021-01-06: 2 g via INTRAVENOUS
  Filled 2021-01-06: qty 50

## 2021-01-06 MED ORDER — LACTATED RINGERS IV BOLUS
1000.0000 mL | Freq: Once | INTRAVENOUS | Status: AC
  Administered 2021-01-06: 1000 mL via INTRAVENOUS

## 2021-01-06 MED ORDER — FLUTICASONE FUROATE-VILANTEROL 100-25 MCG/INH IN AEPB
1.0000 | INHALATION_SPRAY | Freq: Every day | RESPIRATORY_TRACT | Status: DC
  Administered 2021-01-07 – 2021-01-11 (×5): 1 via RESPIRATORY_TRACT
  Filled 2021-01-06: qty 28

## 2021-01-06 MED ORDER — ALBUTEROL SULFATE (2.5 MG/3ML) 0.083% IN NEBU: 2.5000 mg | INHALATION_SOLUTION | Freq: Four times a day (QID) | RESPIRATORY_TRACT | Status: DC | PRN

## 2021-01-06 MED ORDER — LOPERAMIDE HCL 2 MG PO CAPS
4.0000 mg | ORAL_CAPSULE | Freq: Once | ORAL | Status: AC
  Administered 2021-01-06: 4 mg via ORAL
  Filled 2021-01-06: qty 2

## 2021-01-06 MED ORDER — DIPHENHYDRAMINE HCL 50 MG/ML IJ SOLN
12.5000 mg | Freq: Once | INTRAMUSCULAR | Status: AC
  Administered 2021-01-06: 12.5 mg via INTRAVENOUS
  Filled 2021-01-06: qty 1

## 2021-01-06 MED ORDER — BUDESON-GLYCOPYRROL-FORMOTEROL 160-9-4.8 MCG/ACT IN AERO: 1.0000 | INHALATION_SPRAY | Freq: Every day | RESPIRATORY_TRACT | Status: DC | PRN

## 2021-01-06 MED ORDER — SACCHAROMYCES BOULARDII 250 MG PO CAPS
250.0000 mg | ORAL_CAPSULE | Freq: Two times a day (BID) | ORAL | Status: DC
  Administered 2021-01-07 – 2021-01-13 (×13): 250 mg via ORAL
  Filled 2021-01-06 (×13): qty 1

## 2021-01-06 NOTE — ED Notes (Signed)
Dinner Tray Ordered @ 1651. 

## 2021-01-06 NOTE — ED Notes (Signed)
Lunch Tray Ordered @ [id number].

## 2021-01-06 NOTE — ED Provider Notes (Addendum)
[facility] EMERGENCY DEPARTMENT Provider Note   CSN: [id number] Arrival date & time: 01/05/21  2333     History Chief Complaint  Patient presents with  . Diarrhea  . Nausea    Megan [name] is a 75 y.o. female.  75 yo F with a chief complaints of nausea and diarrhea.  This been going on for about 4 days now.  Had some abdominal pain at the onset but that has resolved.  It was dark after she took Pepto-Bismol but that is also resolved.  Has been feeling very weak and shaky.  Having trouble getting up and walking around.  Denies any chest pain or shortness of breath.  Denies cough congestion or fever.  No other sick contacts in the house.  No suspicious food intake.  No recent travel.  Denies urinary symptoms.  Has been feeling hot and cold off and on and her muscles have been aching.  She called her family doctor and they suggested she come to the ED.  The history is provided by the patient.  Diarrhea Associated symptoms: chills, fever (subjective) and myalgias   Associated symptoms: no abdominal pain, no arthralgias, no headaches and no vomiting   Illness Severity:  Moderate Onset quality:  Gradual Duration:  4 days Timing:  Constant Progression:  Worsening Chronicity:  New Associated symptoms: diarrhea, fever (subjective), myalgias and nausea   Associated symptoms: no abdominal pain, no chest pain, no congestion, no headaches, no rhinorrhea, no shortness of breath, no vomiting and no wheezing        Past Medical History:  Diagnosis Date  . AAA (abdominal aortic aneurysm) ([place])    a. s/p stent grafting in 2009.  . Adenomatous colon polyp 05/2001  . Adenomatous duodenal polyp   . Allergy   . Anginal pain ([place])   . COPD (chronic obstructive pulmonary disease) ([place])    pt denies COPD  . Coronary artery disease    a. s/p CABG in 1995;  b. 05/2013 Neg MV, EF 82%;  c. 06/2013 Cath: LM nl, LAD 40-50p, LCX nl, RCA 80p/137m, VG->RCA->PDA->PLSA min irregs,  LIMA->LAD atretic, vigorous LV fxn.  . Diabetes mellitus without complication (HCC)    history of, resolved. 2017  . Diverticulosis   . Eczema, dyshidrotic   . Eczema, dyshidrotic 1986   Dr. Mathis Fare  . Emphysema of lung ([place])   . Fatty liver   . Fibromyalgia   . GERD (gastroesophageal reflux disease)   . Hyperlipidemia   . Hypertension   . Migraine   . Myocardial infarction ([place])   . Osteoarthritis   . Osteoporosis    unsure, having a bone scan soon  . Peripheral arterial disease (HCC)    left leg diagnosed by Dr Mare Ferrari  . Renal artery stenosis ([place])    a. 2008 s/p PTA    Patient Active Problem List   Diagnosis Date Noted  . ARF (acute renal failure) ([place]) 01/06/2021  . S/P lumbar fusion 06/18/2020  . COPD with chronic bronchitis ([place]) 09/28/2018  . Essential hypertension 07/10/2018  . IFG (impaired fasting glucose) 11/10/2017  . DJD (degenerative joint disease), lumbosacral 11/10/2017  . Fibromyalgia 11/10/2017  . Dyshidrotic eczema 11/10/2017  . GERD (gastroesophageal reflux disease) 11/10/2017  . Osteopenia 11/10/2017  . Does use hearing aid 11/10/2017  . 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  . Fatty liver 11/10/2017  . Coronary artery disease   . Peripheral vascular disease with claudication ([place])   . 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  .  History of abdominal aortic aneurysm repair 05/18/2011  . History of renal artery stenosis 05/18/2011  . Mixed hyperlipidemia     Past Surgical History:  Procedure Laterality Date  . ABDOMINAL AORTAGRAM N/A 03/06/2014   Procedure: ABDOMINAL AORTAGRAM;  Surgeon: Rosetta Posner, MD;  Location: [facility] CATH LAB;  Service: Cardiovascular;  Laterality: N/A;  . ABDOMINAL AORTIC ANEURYSM REPAIR  2009   stenting  . ABDOMINAL HYSTERECTOMY     Total, 1992  . BREAST BIOPSY    . CARDIAC CATHETERIZATION    . CARDIOVASCULAR STRESS TEST  11/11/2009   normal study  . CARPAL TUNNEL RELEASE  08/1993   left wrist  . CARPAL TUNNEL RELEASE  01/1997    right  . CATARACT EXTRACTION, BILATERAL  2021  . CORONARY ARTERY BYPASS GRAFT  07/1994   Triple by Dr Ceasar Mons  . dental implants    . EYE SURGERY  03/2020   cataract  . HAND SURGERY Right 09/2002   Right hand pulley release  . LEFT HEART CATHETERIZATION WITH CORONARY/GRAFT ANGIOGRAM N/A 06/11/2013   Procedure: LEFT HEART CATHETERIZATION WITH [name];  Surgeon: Thayer Headings, MD;  Location: [facility] CATH LAB;  Service: Cardiovascular;  Laterality: N/A;  . MULTIPLE TOOTH EXTRACTIONS  2000   All upper teeth.  Has full dneture.   [name] RENAL ARTERY STENT  2008  . SHOULDER ARTHROSCOPY WITH ROTATOR CUFF REPAIR Right 09/27/2018   Procedure: Right shoulder mini open rotator cuff repair;  Surgeon: Susa Day, MD;  Location: [facility] ORS;  Service: Orthopedics;  Laterality: Right;  90 mins  . thyroid cyst apiration  05/1997 and 07/1998  . TONSILECTOMY, ADENOIDECTOMY, BILATERAL MYRINGOTOMY AND TUBES  1989  . TONSILLECTOMY    . TOTAL ABDOMINAL HYSTERECTOMY  1992     OB History   No obstetric history on file.     Family History  Problem Relation Age of Onset  . Heart disease Mother   . Heart attack Mother   . Heart disease Father        before age 59  . Heart attack Father   . Heart disease Sister        before age 61  . Diabetes Sister   . Hyperlipidemia Sister   . Hypertension Sister   . Heart attack Sister   . Liver cancer Sister   . Breast cancer Sister   . Heart disease Brother   . Diabetes Brother   . Hyperlipidemia Brother   . Hypertension Brother   . Heart attack Brother   . AAA (abdominal aortic aneurysm) Brother   . Colon cancer Paternal Grandmother   . Heart disease Brother   . Colon cancer Cousin        first cousin on father's side  . Pancreatic cancer Neg Hx   . Rectal cancer Neg Hx   . Stomach cancer Neg Hx   . Esophageal cancer Neg Hx     Social History   Tobacco Use  . Smoking status: Former Smoker    Packs/day: 0.00    Years: 37.00    Pack  years: 0.00    Types: Cigarettes    Quit date: 12/07/1995    Years since quitting: 25.1  . Smokeless tobacco: Never Used  Vaping Use  . Vaping Use: Never used  Substance Use Topics  . Alcohol use: Yes    Alcohol/week: 6.0 - 10.0 standard drinks    Types: 6 - 10 Glasses of wine per week    Comment:  2-3 glasses daily  . Drug use: No    Home Medications Prior to Admission medications   Medication Sig Start Date End Date Taking? Authorizing Provider  albuterol (PROVENTIL HFA;VENTOLIN HFA) 108 (90 Base) MCG/ACT inhaler Inhale 2 puffs into the lungs every 4 (four) hours as needed for wheezing or shortness of breath. 11/13/18  Yes [name], MD  ALPRAZolam Duanne Moron) 0.25 MG tablet Take 1 tablet (0.25 mg total) by mouth at bedtime as needed for sleep. 09/15/20  Yes [place], Peter M, MD  amLODipine (NORVASC) 5 MG tablet TAKE 1 TABLET BY MOUTH EVERY DAY Patient taking differently: Take 5 mg by mouth daily. 06/12/20  Yes [place], Peter M, MD  aspirin EC 81 MG tablet Take 1 tablet (81 mg total) by mouth daily. 05/09/20  Yes [place], Peter M, MD  atorvastatin (LIPITOR) 80 MG tablet TAKE 1 TABLET BY MOUTH EVERY DAY Patient taking differently: Take 80 mg by mouth daily. 08/18/20  Yes [place], Peter M, MD  augmented betamethasone dipropionate (DIPROLENE-AF) 0.05 % cream Apply 1 application topically daily as needed (rash). For hands 10/01/15  Yes [provider]  B Complex-Biotin-FA (B-100 COMPLEX PO) Take 1 tablet by mouth at bedtime.    Yes [provider]  bismuth subsalicylate (PEPTO BISMOL) 262 MG/15ML suspension Take 30 mLs by mouth every 6 (six) hours as needed for indigestion.   Yes [provider]  Budeson-Glycopyrrol-Formoterol (BREZTRI AEROSPHERE) 160-9-4.8 MCG/ACT AERO Inhale 2 puffs into the lungs in the morning and at bedtime. Patient taking differently: Inhale 1 puff into the lungs daily as needed (shortness og breath). 02/27/20  Yes [name], MD  ciclopirox  (LOPROX) 0.77 % cream Apply 1 application topically daily as needed (hands fungus).    Yes [provider]  CORDRAN 4 MCG/SQCM TAPE Apply 1 each topically daily as needed (Rash).  05/17/19  Yes [provider]  cycloSPORINE (RESTASIS) 0.05 % ophthalmic emulsion Place 1 drop into both eyes 2 (two) times daily.    Yes [provider]  gabapentin (NEURONTIN) 300 MG capsule Take 1-2 capsules (300-600 mg total) by mouth 3 (three) times daily. Patient taking differently: Take 300 mg by mouth 2 (two) times daily. 11/15/19  Yes Lomax, Amy, NP  hydrALAZINE (APRESOLINE) 10 MG tablet TAKE 1 TABLET BY MOUTH THREE TIMES A DAY Patient taking differently: Take 10 mg by mouth 2 (two) times daily. 01/21/20  Yes [place], Peter M, MD  losartan-hydrochlorothiazide (HYZAAR) 100-25 MG tablet Take 1 tablet by mouth daily. 01/21/20  Yes [place], Peter M, MD  meloxicam (MOBIC) 15 MG tablet Take 15 mg by mouth daily. 12/13/20  Yes [provider]  Multiple Vitamins-Minerals (MULTIVITAMIN WITH MINERALS) tablet Take 1 tablet by mouth at bedtime.   Yes [provider]  nitroGLYCERIN (NITROSTAT) 0.4 MG SL tablet Place 1 tablet (0.4 mg total) under the tongue every 5 (five) minutes as needed for chest pain. Patient taking differently: Place 0.4 mg under the tongue every 5 (five) minutes as needed for chest pain (max 3 doses). 01/09/20  Yes [place], Peter M, MD  omeprazole (PRILOSEC) 40 MG capsule TAKE 1 CAPSULE BY MOUTH TWICE A DAY--NEED APPT FOR FURTHER REFILLS Patient taking differently: Take 40 mg by mouth 2 (two) times daily. 12/15/20  Yes Ladene Artist, MD  oxyCODONE-acetaminophen (PERCOCET) 10-325 MG tablet Take 1 tablet by mouth every 6 (six) hours as needed for pain. Patient taking differently: Take 1 tablet by mouth daily as needed for pain. 06/19/20 06/19/21  Yes Meyran, Ocie Cornfield, NP  potassium chloride (KLOR-CON) 10 MEQ tablet TAKE 2 TABLETS (20 MEQ TOTAL) BY MOUTH 2 (TWO) TIMES  DAILY. Patient taking differently: Take 10 mEq by mouth 2 (two) times daily. 04/22/20  Yes [place], Peter M, MD  torsemide (DEMADEX) 20 MG tablet Take 1/2 tablet daily Patient taking differently: Take 10 mg by mouth daily as needed (fluid). 02/18/20  Yes [place], Peter M, MD  tretinoin (RETIN-A) 0.1 % cream Apply 1 application topically every other day. In the evening   Yes [provider]  VITAMIN D, ERGOCALCIFEROL, PO Take 1,000 Units by mouth every evening.    Yes [provider]  Accu-Chek FastClix Lancets MISC Check blood sugar once daily PRN 07/30/19   [name], MD  glucose blood (ACCU-CHEK SMARTVIEW) test strip CHECK BLOOD SUGAR ONCE DAILY AS NEEDED 01/17/20   [name], MD  methocarbamol (ROBAXIN) 500 MG tablet Take 1 tablet (500 mg total) by mouth 4 (four) times daily. Patient not taking: No sig reported 06/19/20   Meyran, Ocie Cornfield, NP    Allergies    Anticoagulant compound, Bextra [valdecoxib], Hydrocod polst-cpm polst er, Lipitor [atorvastatin], Mevacor [lovastatin], Plavix [clopidogrel bisulfate], Zocor [simvastatin], Doxycycline, Metoprolol, Morphine and related, Codeine, Hydrocod polst-cpm polst er, and Lisinopril  Review of Systems   Review of Systems  Constitutional: Positive for chills and fever (subjective).  HENT: Negative for congestion and rhinorrhea.   Eyes: Negative for redness and visual disturbance.  Respiratory: Negative for shortness of breath and wheezing.   Cardiovascular: Negative for chest pain and palpitations.  Gastrointestinal: Positive for diarrhea and nausea. Negative for abdominal pain and vomiting.  Genitourinary: Negative for dysuria and urgency.  Musculoskeletal: Positive for myalgias. Negative for arthralgias.  Skin: Negative for pallor and wound.  Neurological: Negative for dizziness and headaches. Tremors: feels shaky.    Physical Exam Updated Vital Signs BP 121/60 (BP Location: Right Arm)   Pulse 78   Temp  98.9 F (37.2 C) (Oral)   Resp (!) 22   SpO2 98%   Physical Exam Vitals and nursing note reviewed.  Constitutional:      General: She is not in acute distress.    Appearance: She is well-developed and well-nourished. She is not diaphoretic.  HENT:     Head: Normocephalic and atraumatic.  Eyes:     Extraocular Movements: EOM normal.     Pupils: Pupils are equal, round, and reactive to light.  Cardiovascular:     Rate and Rhythm: Normal rate and regular rhythm.     Heart sounds: No murmur heard. No friction rub. No gallop.   Pulmonary:     Effort: Pulmonary effort is normal.     Breath sounds: No wheezing or rales.  Abdominal:     General: There is no distension.     Palpations: Abdomen is soft.     Tenderness: There is no abdominal tenderness.  Musculoskeletal:        General: No tenderness or edema.     Cervical back: Normal range of motion and neck supple.  Skin:    General: Skin is warm and dry.  Neurological:     Mental Status: She is alert and oriented to person, place, and time.  Psychiatric:        Mood and Affect: Mood and affect normal.        Behavior: Behavior normal.     ED Results / Procedures / Treatments   Labs (all labs ordered are listed,  but only abnormal results are displayed) Labs Reviewed  COMPREHENSIVE METABOLIC PANEL - Abnormal; Notable for the following components:      Result Value   Sodium 130 (*)    Chloride 95 (*)    CO2 21 (*)    Glucose, Bld 151 (*)    BUN 29 (*)    Creatinine, Ser 3.29 (*)    Total Protein 5.8 (*)    Albumin 2.9 (*)    GFR, Estimated 14 (*)    All other components within normal limits  CBC - Abnormal; Notable for the following components:   WBC 14.3 (*)    All other components within normal limits  URINALYSIS, ROUTINE W REFLEX MICROSCOPIC - Abnormal; Notable for the following components:   Color, Urine AMBER (*)    APPearance HAZY (*)    Hgb urine dipstick MODERATE (*)    Protein, ur 30 (*)    Leukocytes,Ua  LARGE (*)    Bacteria, UA RARE (*)    All other components within normal limits  MAGNESIUM - Abnormal; Notable for the following components:   Magnesium 1.6 (*)    All other components within normal limits  SARS CORONAVIRUS 2 BY RT PCR (HOSPITAL ORDER, [place] [facility])  GASTROINTESTINAL PANEL BY PCR, STOOL (REPLACES STOOL CULTURE)  C DIFFICILE QUICK SCREEN W PCR REFLEX  URINE CULTURE  LIPASE, BLOOD    EKG None  Radiology DG ABD ACUTE 2+V W 1V CHEST  Result Date: 01/06/2021 CLINICAL DATA:  Diarrhea. EXAM: DG ABDOMEN ACUTE WITH 1 VIEW CHEST COMPARISON:  Chest x-ray dated June 16, 2020. FINDINGS: There is no evidence of dilated bowel loops or free intraperitoneal air. No radiopaque calculi or other significant radiographic abnormality is seen. Prior EVAR and lumbar fusion. Heart size and mediastinal contours are within normal limits. Atherosclerotic calcification of the aortic arch. Prior CABG. Both lungs are clear. IMPRESSION: Negative abdominal radiographs.  No acute cardiopulmonary disease. Electronically Signed   By: Titus Dubin M.D.   On: 01/06/2021 12:19    Procedures Procedures   Medications Ordered in ED Medications  diphenhydrAMINE (BENADRYL) injection 12.5 mg (has no administration in time range)  metoCLOPramide (REGLAN) injection 5 mg (has no administration in time range)  loperamide (IMODIUM) capsule 4 mg (has no administration in time range)  heparin injection 5,000 Units (has no administration in time range)  sodium chloride flush (NS) 0.9 % injection 3 mL (has no administration in time range)  acetaminophen (TYLENOL) tablet 650 mg (has no administration in time range)    Or  acetaminophen (TYLENOL) suppository 650 mg (has no administration in time range)  0.9 %  sodium chloride infusion (has no administration in time range)  ondansetron (ZOFRAN) tablet 4 mg (has no administration in time range)    Or  ondansetron (ZOFRAN) injection 4 mg  (has no administration in time range)  albuterol (PROVENTIL) (2.5 MG/3ML) 0.083% nebulizer solution 2.5 mg (has no administration in time range)  ondansetron (ZOFRAN-ODT) disintegrating tablet 4 mg (4 mg Oral Given 01/06/21 0001)  lactated ringers bolus 1,000 mL (1,000 mLs Intravenous New Bag/Given 01/06/21 1225)    ED Course  I have reviewed the triage vital signs and the nursing notes.  Pertinent labs & imaging results that were available during my care of the patient were reviewed by me and considered in my medical decision making (see chart for details).    MDM Rules/Calculators/A&P  75 yo F with a chief complaints of nausea and diarrhea.  Going on for the past 4 days.  Not really eating or drinking anything.  Started having what sounds like subjective fevers and chills.  Found to have an acute kidney injury on lab work here.  Mild hyponatremia.  Will discuss with medicine for admission.  CRITICAL CARE Performed by: Cecilio Asper   Total critical care time: 35 minutes  Critical care time was exclusive of separately billable procedures and treating other patients.  Critical care was necessary to treat or prevent imminent or life-threatening deterioration.  Critical care was time spent personally by me on the following activities: development of treatment plan with patient and/or surrogate as well as nursing, discussions with consultants, evaluation of patient's response to treatment, examination of patient, obtaining history from patient or surrogate, ordering and performing treatments and interventions, ordering and review of laboratory studies, ordering and review of radiographic studies, pulse oximetry and re-evaluation of patient's condition.   The patients results and plan were reviewed and discussed.   Any x-rays performed were independently reviewed by myself.   Differential diagnosis were considered with the presenting HPI.  Medications   diphenhydrAMINE (BENADRYL) injection 12.5 mg (has no administration in time range)  metoCLOPramide (REGLAN) injection 5 mg (has no administration in time range)  loperamide (IMODIUM) capsule 4 mg (has no administration in time range)  heparin injection 5,000 Units (has no administration in time range)  sodium chloride flush (NS) 0.9 % injection 3 mL (has no administration in time range)  acetaminophen (TYLENOL) tablet 650 mg (has no administration in time range)    Or  acetaminophen (TYLENOL) suppository 650 mg (has no administration in time range)  0.9 %  sodium chloride infusion (has no administration in time range)  ondansetron (ZOFRAN) tablet 4 mg (has no administration in time range)    Or  ondansetron (ZOFRAN) injection 4 mg (has no administration in time range)  albuterol (PROVENTIL) (2.5 MG/3ML) 0.083% nebulizer solution 2.5 mg (has no administration in time range)  ondansetron (ZOFRAN-ODT) disintegrating tablet 4 mg (4 mg Oral Given 01/06/21 0001)  lactated ringers bolus 1,000 mL (1,000 mLs Intravenous New Bag/Given 01/06/21 1225)    Vitals:   01/06/21 0306 01/06/21 0639 01/06/21 0840 01/06/21 1047  BP: (!) 135/59 (!) 142/68 (!) 123/46 121/60  Pulse: 89 80 74 78  Resp: 18 17 16  (!) 22  Temp:    98.9 F (37.2 C)  TempSrc:    Oral  SpO2: 98% 98% 99% 98%    Final diagnoses:  AKI (acute kidney injury) ([place])    Admission/ observation were discussed with the admitting physician, patient and/or family and they are comfortable with the plan.     Final Clinical Impression(s) / ED Diagnoses Final diagnoses:  AKI (acute kidney injury) [facility])    Rx / DC Orders ED Discharge Orders    None       Deno Etienne, DO 01/06/21 Worthing, [place], DO 01/21/21 1303

## 2021-01-06 NOTE — ED Notes (Signed)
Pt complaining of a headache and ear ache on L side

## 2021-01-06 NOTE — ED Notes (Signed)
Report given to [facility], Therapist, sports.
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
## 2021-01-06 NOTE — ED Notes (Signed)
Pt placed on 2L nasal cannula due to O2 sats being low

## 2021-01-07 ENCOUNTER — Inpatient Hospital Stay (HOSPITAL_COMMUNITY): Payer: Medicare Other

## 2021-01-07 ENCOUNTER — Other Ambulatory Visit: Payer: Self-pay | Admitting: Gastroenterology

## 2021-01-07 LAB — GASTROINTESTINAL PANEL BY PCR, STOOL (REPLACES STOOL CULTURE)

## 2021-01-07 LAB — BASIC METABOLIC PANEL
Anion gap: 13 (ref 5–15)
BUN: 28 mg/dL — ABNORMAL HIGH (ref 8–23)
CO2: 23 mmol/L (ref 22–32)
Calcium: 9.1 mg/dL (ref 8.9–10.3)
Chloride: 98 mmol/L (ref 98–111)
Creatinine, Ser: 2.81 mg/dL — ABNORMAL HIGH (ref 0.44–1.00)
GFR, Estimated: 17 mL/min — ABNORMAL LOW (ref >60)
Glucose, Bld: 102 mg/dL — ABNORMAL HIGH (ref 70–99)
Potassium: 3.9 mmol/L (ref 3.5–5.1)
Sodium: 134 mmol/L — ABNORMAL LOW (ref 135–145)

## 2021-01-07 LAB — CBC
HCT: 39.8 % (ref 36.0–46.0)
Hemoglobin: 13.3 g/dL (ref 12.0–15.0)
MCH: 29.4 pg (ref 26.0–34.0)
MCHC: 33.4 g/dL (ref 30.0–36.0)
MCV: 88.1 fL (ref 80.0–100.0)
Platelets: 243 10*3/uL (ref 150–400)
RBC: 4.52 MIL/uL (ref 3.87–5.11)
RDW: 12.5 % (ref 11.5–15.5)
WBC: 12.6 10*3/uL — ABNORMAL HIGH (ref 4.0–10.5)
nRBC: 0 % (ref 0.0–0.2)

## 2021-01-07 LAB — GLUCOSE, CAPILLARY
Glucose-Capillary: 90 mg/dL (ref 70–99)
Glucose-Capillary: 108 mg/dL — ABNORMAL HIGH (ref 70–99)
Glucose-Capillary: 94 mg/dL (ref 70–99)
Glucose-Capillary: 86 mg/dL (ref 70–99)
Glucose-Capillary: 113 mg/dL — ABNORMAL HIGH (ref 70–99)

## 2021-01-07 LAB — CK: Total CK: 68 U/L (ref 38–234)

## 2021-01-07 MED ORDER — IPRATROPIUM-ALBUTEROL 0.5-2.5 (3) MG/3ML IN SOLN: 3.0000 mL | Freq: Two times a day (BID) | RESPIRATORY_TRACT | Status: DC

## 2021-01-07 MED ORDER — ALUM & MAG HYDROXIDE-SIMETH 200-200-20 MG/5ML PO SUSP
15.0000 mL | Freq: Four times a day (QID) | ORAL | Status: DC | PRN
  Administered 2021-01-11 – 2021-01-13 (×2): 15 mL via ORAL
  Filled 2021-01-07 (×2): qty 30

## 2021-01-07 MED ORDER — HYDROCORTISONE 1 % EX CREA
TOPICAL_CREAM | CUTANEOUS | Status: DC | PRN
  Filled 2021-01-07: qty 28

## 2021-01-07 MED ORDER — METOCLOPRAMIDE HCL 5 MG/ML IJ SOLN
5.0000 mg | Freq: Once | INTRAMUSCULAR | Status: AC
  Administered 2021-01-07: 5 mg via INTRAVENOUS
  Filled 2021-01-07: qty 2

## 2021-01-07 MED ORDER — WHITE PETROLATUM EX OINT
TOPICAL_OINTMENT | CUTANEOUS | Status: DC | PRN
  Administered 2021-01-07: 0.2 via TOPICAL
  Filled 2021-01-07: qty 28.35

## 2021-01-07 NOTE — Progress Notes (Signed)
PROGRESS NOTE    Megan [name]  [id number] DOB: [birth year] DOA: 01/05/2021 PCP: [name], MD   Chief Complaint  Patient presents with  . Diarrhea  . Nausea  Brief Narrative: 75 year old female with HTN, HLD, CAD/PAD, COPD, T2DM, history of back surgery and mildly issues with balance since surgeryn and walks with cane at baseline, admitted 01/06/21 with diarrhea-nonbloody and generalized weakness x4 days prior to admission.  No recent antibiotic use.  Patient was somewhat shaky and very unbalanced night before admission and was confused as well (she sent a text to the daughter-in-law that did not make sense), also complaining of some double vision.  Seen in the ED vitals are stable lab with a leukocytosis 14.3, hyponatremia 130 AKI 3.2 UA positive for large leukocytes rare bacteria, patient was given IV fluids, stool test was negative for C. difficile.  She was admitted for further work-up  Subjective:  Afebrile overnight.  Blood pressure in 110s, leukocytosis downtrending creatinine improving 2.8 Had loose BM this am Earlier does not recollect having breakfast or peeing and seemed confused overnight Now she is aaox3, and appropriate. She is needing 1l [place]-denies chest pain cough or shortness of breath No focal weakness, no numbness, no double vision Says "Not shaking as much" Daughter at bedise  Assessment & Plan:  ARF: Likely from 3 renal/volume depletion due to diarrhea along with home meds HCTZ/losartan and torsemide. Creatinine improving on IV fluids we will continue the same.  Continue to hold diuretics/losartan/NSAID including her meloxicam.  Check CK, bladder scan, monitor urine output Recent Labs  Lab 01/06/21 0011 01/07/21 0220  BUN 29* 28*  CREATININE 3.29* 2.81*   Diarrhea: Negative for C. difficile, GI panel pending.  Continue enteric precaution, as needed Imodium  Acute metabolic encephalopathy/memory issues recurrent: CT head no acute finding on  admission.  Patient appeared to be back to baseline in the ED although was reporting some double vision she was placed on neurochecks.  Some confusion overnight and patient has no recollection of having breakfast this morning although currently she is alert awake oriented,given recurrent transient confusions- check MRI brain  E coli UTI POA: W/ burning with urination UA with large leukocytes.  Continue ceftriaxone urine culture showed E. coli follow-up C/S.  Acute hypoxic respiratory failure SPO2 dropped in mid 80s needing 1 L nasal cannula this am. Denies chest pain shortness of breath.  Has COPD we will add bronchodilators, check chest x-ray again.  Hyponatremia, mild at 134 from prerenal dehydration.  T2DM:Last hemoglobin 7.9 stable June 13, 2020, also well controlled 90.  Would allow regular diet.  HOLD MEDS Recent Labs  Lab 01/07/21 0432  GLUCAP 90   GERD: Continue PPI  Essential hypertension/history of renal artery stenosis.  Blood pressure is controlled.  Continue amlodipine and.  Hydralazine for now.  Holding diuretics.  CAD/PAD history of CABG 1995 on a statin and aspirin.  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  Had a renal ultrasound in June 2019  HLD on Lipitor.  Nutrition: Diet Order            Diet Heart Room service appropriate? Yes; Fluid consistency: Thin  Diet effective now                Pt's There is no height or weight on file to calculate BMI.  DVT prophylaxis: heparin injection 5,000 Units Start: 01/06/21 1400 Code Status:  Code Status: Full Code  Family Communication: plan of care discussed with patient and daughter at bedside.  Status is: Inpatient Remains inpatient appropriate because:Inpatient level of care appropriate due to severity of illness  Dispo: The patient is from: Home              Anticipated d/c is to: Home               Anticipated d/c date is: 2 days              Patient currently is not medically stable to d/c.   Difficult to place patient No  Consultants:see note  Procedures:see note  Culture/Microbiology No results found for: SDES, SPECREQUEST, CULT, REPTSTATUS  Other culture-see note  Medications: Scheduled Meds: . amLODipine  5 mg Oral Daily  . aspirin EC  81 mg Oral Daily  . atorvastatin  80 mg Oral Daily  . cycloSPORINE  1 drop Both Eyes BID  . fluticasone furoate-vilanterol  1 puff Inhalation Daily   And  . umeclidinium bromide  1 puff Inhalation Daily  . heparin  5,000 Units Subcutaneous Q8H  . hydrALAZINE  10 mg Oral BID  . pantoprazole  40 mg Oral Daily  . saccharomyces boulardii  250 mg Oral BID  . sodium chloride flush  3 mL Intravenous Q12H  . tretinoin  1 application Topical QODAY   Continuous Infusions: . sodium chloride 100 mL/hr at 01/07/21 0659  . cefTRIAXone (ROCEPHIN)  IV Stopped (01/06/21 2135)    Antimicrobials: Anti-infectives (From admission, onward)   Start     Dose/Rate Route Frequency Ordered Stop   01/06/21 1800  cefTRIAXone (ROCEPHIN) 2 g in sodium chloride 0.9 % 100 mL IVPB        2 g 200 mL/hr over 30 Minutes Intravenous Every 24 hours 01/06/21 1702       Objective: Vitals: Today's Vitals   01/06/21 2326 01/06/21 2359 01/07/21 0059 01/07/21 0431  BP: (!) 117/97   (!) 117/40  Pulse: 83   92  Resp: 17   19  Temp: 98.9 F (37.2 C)   98 F (36.7 C)  TempSrc: Oral     SpO2: 94%   (!) 78%  PainSc:  6  3      Intake/Output Summary (Last 24 hours) at 01/07/2021 0851 Last data filed at 01/07/2021 0659 Gross per 24 hour  Intake 1940.66 ml  Output --  Net 1940.66 ml   There were no vitals filed for this visit. Weight change:   Intake/Output from previous day: 02/01 0701 - 02/02 0700 In: 1940.7 [P.O.:240; I.V.:700.7; IV Piggyback:1000] Out: -  Intake/Output this shift: No intake/output data recorded. There were no vitals filed for this  visit.  Examination: General exam: AAOx3 ,NAD, weak appearing. HEENT:Oral mucosa dry, Ear/Nose WNL grossly,dentition normal. Respiratory system: bilaterally clear,no wheezing or crackles,no use of accessory muscle, non tender. Cardiovascular system: S1 & S2 +, regular, No JVD. Gastrointestinal system: Abdomen soft, NT,ND, BS+. Nervous System:Alert, awake, moving extremities and grossly nonfocal Extremities: No edema, distal peripheral pulses palpable.  Skin: No rashes,no icterus. MSK: Normal muscle bulk,tone, power  Data Reviewed: I have personally reviewed following labs and imaging studies CBC: Recent Labs  Lab 01/06/21 0011 01/07/21 0339  WBC 14.3* 12.6*  HGB 13.2 13.3  HCT 38.8 39.8  MCV 87.4 88.1  PLT 212 0000000   Basic Metabolic Panel: Recent Labs  Lab 01/06/21 0011 01/06/21 1043 01/07/21 0220  NA 130*  --  134*  K  3.6  --  3.9  CL 95*  --  98  CO2 21*  --  23  GLUCOSE 151*  --  102*  BUN 29*  --  28*  CREATININE 3.29*  --  2.81*  CALCIUM 9.5  --  9.1  MG  --  1.6*  --    GFR: CrCl cannot be calculated (Unknown ideal weight.). Liver Function Tests: Recent Labs  Lab 01/06/21 0011  AST 36  ALT 22  ALKPHOS 67  BILITOT 1.0  PROT 5.8*  ALBUMIN 2.9*   Recent Labs  Lab 01/06/21 0011  LIPASE 43   No results for input(s): AMMONIA in the last 168 hours. Coagulation Profile: No results for input(s): INR, PROTIME in the last 168 hours. Cardiac Enzymes: No results for input(s): CKTOTAL, CKMB, CKMBINDEX, TROPONINI in the last 168 hours. BNP (last 3 results) No results for input(s): PROBNP in the last 8760 hours. HbA1C: No results for input(s): HGBA1C in the last 72 hours. CBG: Recent Labs  Lab 01/07/21 0432  GLUCAP 90   Lipid Profile: No results for input(s): CHOL, HDL, LDLCALC, TRIG, CHOLHDL, LDLDIRECT in the last 72 hours. Thyroid Function Tests: No results for input(s): TSH, T4TOTAL, FREET4, T3FREE, THYROIDAB in the last 72 hours. Anemia  Panel: No results for input(s): VITAMINB12, FOLATE, FERRITIN, TIBC, IRON, RETICCTPCT in the last 72 hours. Sepsis Labs: No results for input(s): PROCALCITON, LATICACIDVEN in the last 168 hours.  Recent Results (from the past 240 hour(s))  SARS Coronavirus 2 by RT PCR (hospital order, performed in [facility] hospital lab) Nasopharyngeal Nasopharyngeal Swab     Status: None   Collection Time: 01/06/21 10:42 AM   Specimen: Nasopharyngeal Swab  Result Value Ref Range Status   SARS Coronavirus 2 NEGATIVE NEGATIVE Final    Comment: (NOTE) SARS-CoV-2 target nucleic acids are NOT DETECTED.  The SARS-CoV-2 RNA is generally detectable in upper and lower respiratory specimens during the acute phase of infection. The lowest concentration of SARS-CoV-2 viral copies this assay can detect is 250 copies / mL. A negative result does not preclude SARS-CoV-2 infection and should not be used as the sole basis for treatment or other patient management decisions.  A negative result may occur with improper specimen collection / handling, submission of specimen other than nasopharyngeal swab, presence of viral mutation(s) within the areas targeted by this assay, and inadequate number of viral copies (<250 copies / mL). A negative result must be combined with clinical observations, patient history, and epidemiological information.  Fact Sheet for Patients:   StrictlyIdeas.no  Fact Sheet for Healthcare Providers: BankingDealers.co.za  This test is not yet approved or  cleared by the [place] FDA and has been authorized for detection and/or diagnosis of SARS-CoV-2 by FDA under an Emergency Use Authorization (EUA).  This EUA will remain in effect (meaning this test can be used) for the duration of the COVID-19 declaration under Section 564(b)(1) of the Act, 21 U.S.C. section 360bbb-3(b)(1), unless the authorization is terminated or revoked  sooner.  Performed at [facility], [place] [address] [place], [place] [address]   C Difficile Quick Screen w PCR reflex     Status: None   Collection Time: 01/06/21  5:14 PM   Specimen: Stool  Result Value Ref Range Status   C Diff antigen NEGATIVE NEGATIVE Final   C Diff toxin NEGATIVE NEGATIVE Final   C Diff interpretation No C. difficile detected.  Final    Comment: Performed at [facility],  [address], [place], [place] [address]     Radiology Studies: CT HEAD WO CONTRAST  Result Date: 01/06/2021 CLINICAL DATA:  Neuro deficit, acute, stroke suspected Mental status change, unknown cause Patient reports dizziness and speech issues for 4 days. EXAM: CT HEAD WITHOUT CONTRAST TECHNIQUE: Contiguous axial images were obtained from the base of the skull through the vertex without intravenous contrast. COMPARISON:  Brain MRI 09/30/2020 FINDINGS: Brain: Brain volume is normal for age. No intracranial hemorrhage, mass effect, or midline shift. No hydrocephalus. The basilar cisterns are patent. 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 No evidence of territorial infarct or acute ischemia. No extra-axial or intracranial fluid collection. Vascular: Atherosclerosis of skullbase vasculature without hyperdense vessel or abnormal calcification. Skull: No fracture or focal lesion. Sinuses/Orbits: Chronic opacification of lower mastoid air cells. Paranasal sinuses are clear. Bilateral cataract resection. Other: None. IMPRESSION: No acute intracranial abnormality. Electronically Signed   By: Keith Rake M.D.   On: 01/06/2021 20:36   DG ABD ACUTE 2+V W 1V CHEST  Result Date: 01/06/2021 CLINICAL DATA:  Diarrhea. EXAM: DG ABDOMEN ACUTE WITH 1 VIEW CHEST COMPARISON:  Chest x-ray dated June 16, 2020. FINDINGS: There is no evidence of dilated bowel loops or free intraperitoneal air. No radiopaque calculi or other significant radiographic abnormality is seen. Prior EVAR and lumbar fusion. Heart  size and mediastinal contours are within normal limits. Atherosclerotic calcification of the aortic arch. Prior CABG. Both lungs are clear. IMPRESSION: Negative abdominal radiographs.  No acute cardiopulmonary disease. Electronically Signed   By: Titus Dubin M.D.   On: 01/06/2021 12:19     LOS: 1 day   Antonieta Pert, MD [facility]  01/07/2021, 8:51 AM

## 2021-01-07 NOTE — Progress Notes (Signed)
   01/07/21 0917  Assess: MEWS Score  Temp (!) 101.5 F (38.6 C)  BP (!) 135/46  Pulse Rate 97  Resp 20  SpO2 92 %  O2 Device Nasal Cannula  O2 Flow Rate (L/min) 1 L/min  Assess: MEWS Score  MEWS Temp 2  MEWS Systolic 0  MEWS Pulse 0  MEWS RR 0  MEWS LOC 0  MEWS Score 2  MEWS Score Color Yellow  Assess: if the MEWS score is Yellow or Red  Were vital signs taken at a resting state? Yes  Focused Assessment No change from prior assessment  Early Detection of Sepsis Score *See Row Information* Low  MEWS guidelines implemented *See Row Information* Yes  Treat  MEWS Interventions Administered scheduled meds/treatments  Pain Scale 0-10  Pain Score 4  Pain Type Chronic pain  Pain Location Back  Take Vital Signs  Increase Vital Sign Frequency  Yellow: Q 2hr X 2 then Q 4hr X 2, if remains yellow, continue Q 4hrs  Notify: Charge Nurse/RN  Name of Charge Nurse/RN Notified Lindsey, RN  Date Charge Nurse/RN Notified 01/07/21  Time Charge Nurse/RN Notified [phone]  Document  Progress note created (see row info) Yes

## 2021-01-07 NOTE — Progress Notes (Signed)
Patient arrived to 6N16, alert and oriented x4 and able to make needs known. No acute distress noted. No c/o SOB/DOB.  02 sat 94% on room air. Has c/o 6/10 pain to lower back, pain med was administered. Neurochecks WNL. Continues on Enteric Precautions.- GI Panel pending.   Call bell is within reach and will continue to monitor

## 2021-01-08 ENCOUNTER — Inpatient Hospital Stay (HOSPITAL_COMMUNITY): Payer: Medicare Other

## 2021-01-08 ENCOUNTER — Other Ambulatory Visit: Payer: Medicare Other

## 2021-01-08 DIAGNOSIS — K5732 Diverticulitis of large intestine without perforation or abscess without bleeding: Secondary | ICD-10-CM

## 2021-01-08 DIAGNOSIS — R933 Abnormal findings on diagnostic imaging of other parts of digestive tract: Secondary | ICD-10-CM

## 2021-01-08 LAB — BASIC METABOLIC PANEL
Anion gap: 11 (ref 5–15)
BUN: 23 mg/dL (ref 8–23)
CO2: 21 mmol/L — ABNORMAL LOW (ref 22–32)
Calcium: 8.5 mg/dL — ABNORMAL LOW (ref 8.9–10.3)
Chloride: 101 mmol/L (ref 98–111)
Creatinine, Ser: 2.13 mg/dL — ABNORMAL HIGH (ref 0.44–1.00)
GFR, Estimated: 24 mL/min — ABNORMAL LOW (ref >60)
Glucose, Bld: 80 mg/dL (ref 70–99)
Potassium: 3.7 mmol/L (ref 3.5–5.1)
Sodium: 133 mmol/L — ABNORMAL LOW (ref 135–145)

## 2021-01-08 LAB — URINE CULTURE: Culture: >=100000 — AB

## 2021-01-08 LAB — GLUCOSE, CAPILLARY
Glucose-Capillary: 79 mg/dL (ref 70–99)
Glucose-Capillary: 85 mg/dL (ref 70–99)
Glucose-Capillary: 107 mg/dL — ABNORMAL HIGH (ref 70–99)

## 2021-01-08 LAB — CBC
HCT: 35.5 % — ABNORMAL LOW (ref 36.0–46.0)
Hemoglobin: 12 g/dL (ref 12.0–15.0)
MCH: 30 pg (ref 26.0–34.0)
MCHC: 33.8 g/dL (ref 30.0–36.0)
MCV: 88.8 fL (ref 80.0–100.0)
Platelets: 218 10*3/uL (ref 150–400)
RBC: 4 MIL/uL (ref 3.87–5.11)
RDW: 12.6 % (ref 11.5–15.5)
WBC: 8.7 10*3/uL (ref 4.0–10.5)
nRBC: 0 % (ref 0.0–0.2)

## 2021-01-08 LAB — SEDIMENTATION RATE: Sed Rate: 38 mm/hr — ABNORMAL HIGH (ref 0–22)

## 2021-01-08 LAB — BRAIN NATRIURETIC PEPTIDE: B Natriuretic Peptide: 862.1 pg/mL — ABNORMAL HIGH (ref 0.0–100.0)

## 2021-01-08 LAB — C-REACTIVE PROTEIN: CRP: 19.2 mg/dL — ABNORMAL HIGH (ref <1.0)

## 2021-01-08 MED ORDER — OXYCODONE HCL 5 MG PO TABS
5.0000 mg | ORAL_TABLET | Freq: Every day | ORAL | Status: DC | PRN
  Administered 2021-01-09 – 2021-01-13 (×5): 5 mg via ORAL
  Filled 2021-01-08 (×6): qty 1

## 2021-01-08 MED ORDER — HYDRALAZINE HCL 20 MG/ML IJ SOLN
INTRAMUSCULAR | Status: AC
  Filled 2021-01-08: qty 1

## 2021-01-08 MED ORDER — HYDRALAZINE HCL 20 MG/ML IJ SOLN: 5.0000 mg | Freq: Once | INTRAMUSCULAR | Status: DC

## 2021-01-08 MED ORDER — FUROSEMIDE 10 MG/ML IJ SOLN
INTRAMUSCULAR | Status: AC
  Administered 2021-01-08: 40 mg
  Filled 2021-01-08: qty 4

## 2021-01-08 MED ORDER — ACETAMINOPHEN 325 MG PO TABS
325.0000 mg | ORAL_TABLET | Freq: Every day | ORAL | Status: DC | PRN
  Administered 2021-01-09: 325 mg via ORAL
  Filled 2021-01-08: qty 1

## 2021-01-08 MED ORDER — IOHEXOL 9 MG/ML PO SOLN
ORAL | Status: AC
  Filled 2021-01-08: qty 1000

## 2021-01-08 MED ORDER — CEPHALEXIN 500 MG PO CAPS
500.0000 mg | ORAL_CAPSULE | Freq: Two times a day (BID) | ORAL | Status: DC
  Administered 2021-01-08: 500 mg via ORAL
  Filled 2021-01-08 (×2): qty 1

## 2021-01-08 MED ORDER — FUROSEMIDE 10 MG/ML IJ SOLN: 40.0000 mg | Freq: Once | INTRAMUSCULAR | Status: AC

## 2021-01-08 NOTE — Progress Notes (Signed)
Called back to see patient for respiratory distress/hypoxia.  X ray shows ? Volume overload-- will dose IV lasix x 1.  O2 to keep sats 90-92%.  On 3L currently.  Transfer to SDU.  Exam shows no wheezing but does sounds like crackles.  Minimal LE edema.  Check echo as well.   Labs in the AM Bladder scan was negative per RN. Eulogio Bear DO

## 2021-01-08 NOTE — Plan of Care (Signed)
  Problem: Education: Goal: Knowledge of General Education information will improve Description: Including pain rating scale, medication(s)/side effects and non-pharmacologic comfort measures Outcome: Progressing   Problem: Health Behavior/Discharge Planning: Goal: Ability to manage health-related needs will improve Outcome: Progressing   Problem: Clinical Measurements: Goal: Ability to maintain clinical measurements within normal limits will improve Outcome: Progressing   Problem: Clinical Measurements: Goal: Will remain free from infection Outcome: Progressing   Problem: Clinical Measurements: Goal: Diagnostic test results will improve Outcome: Progressing   Problem: Clinical Measurements: Goal: Respiratory complications will improve Outcome: Progressing   Problem: Clinical Measurements: Goal: Cardiovascular complication will be avoided Outcome: Progressing   Problem: Activity: Goal: Risk for activity intolerance will decrease Outcome: Progressing   Problem: Nutrition: Goal: Adequate nutrition will be maintained Outcome: Progressing

## 2021-01-08 NOTE — Plan of Care (Signed)

## 2021-01-08 NOTE — Progress Notes (Addendum)
PROGRESS NOTE    Megan [name]  [id number] DOB: 08-Feb-1946 DOA: 01/05/2021 PCP: [name], MD   Chief Complaint  Patient presents with  . Diarrhea  . Nausea    Brief Narrative: 75 year old female with HTN, HLD, CAD/PAD, COPD, T2DM, history of back surgery and mildly issues with balance since surgeryn and walks with cane at baseline, admitted 01/06/21 with diarrhea-nonbloody and generalized weakness x4 days prior to admission.   Found to have UTI but her diarrhea has continued  Subjective: Says she is still having diarrhea  Assessment & Plan:  ARF:  -Likely pre-renal/volume depletion due to diarrhea along with home meds HCTZ/losartan and torsemide -daily labs and continue IVF  Diarrhea: Negative for C. difficile, GI panel negative. -on lactobacillus -check CT scan- ? Colitis -GI consult  Acute metabolic encephalopathy/memory issues recurrent:  -MRI brain negative -seems back at baseline -check B12  E coli UTI POA:  -Continue ceftriaxone   Acute hypoxic respiratory failure  -SPO2 dropped in mid 80s needing 1 L nasal cannula  -x ray negative -add incentive spirometry and MOBILIZE patient  Hyponatremia, mild at 134 from prerenal dehydration.  T2DM:Last hemoglobin 7.9 stable June 13, 2020, also well controlled 90.  Would allow regular diet.  HOLD MEDS  GERD: Continue PPI  Essential hypertension/history of renal artery stenosis.  Blood pressure is controlled.  Continue amlodipine and.  Hydralazine for now.  Holding diuretics.  CAD/PAD history of CABG 1995 on a statin and aspirin.  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  Had a renal ultrasound in June 2019  HLD on Lipitor.   DVT prophylaxis: heparin injection 5,000 Units Start: 01/06/21 1400 Code Status:   Code Status: Full Code  Family Communication: plan of care discussed with  patient   Status is: Inpatient Remains inpatient appropriate because:Inpatient level of care appropriate due to severity of illness Spoke with MPOA: Graylin Shiver Dispo: The patient is from: Home              Anticipated d/c is to: Home              Anticipated d/c date is: 2 days              Patient currently is not medically stable to d/c.   Difficult to place patient No  Consultants: GI   Culture/Microbiology    Component Value Date/Time   SDES URINE, RANDOM 01/06/2021 1149   SPECREQUEST  01/06/2021 1149    NONE Performed at [facility], [place] [address] [place],  [address]    CULT >=100,000 COLONIES/mL ESCHERICHIA COLI (A) 01/06/2021 1149   REPTSTATUS 01/08/2021 FINAL 01/06/2021 1149    Other culture-see note  Medications: Scheduled Meds: . amLODipine  5 mg Oral Daily  . aspirin EC  81 mg Oral Daily  . atorvastatin  80 mg Oral Daily  . cycloSPORINE  1 drop Both Eyes BID  . fluticasone furoate-vilanterol  1 puff Inhalation Daily   And  . umeclidinium bromide  1 puff Inhalation Daily  . heparin  5,000 Units Subcutaneous Q8H  . hydrALAZINE  10 mg Oral BID  . pantoprazole  40 mg Oral Daily  . saccharomyces boulardii  250 mg Oral BID  . sodium chloride flush  3 mL Intravenous Q12H  . tretinoin  1 application Topical QODAY   Continuous Infusions: . sodium chloride 100 mL/hr at 01/07/21 0909  .  cefTRIAXone (ROCEPHIN)  IV 2 g (01/07/21 1833)    Antimicrobials: Anti-infectives (From admission, onward)   Start     Dose/Rate Route Frequency Ordered Stop   01/06/21 1800  cefTRIAXone (ROCEPHIN) 2 g in sodium chloride 0.9 % 100 mL IVPB        2 g 200 mL/hr over 30 Minutes Intravenous Every 24 hours 01/06/21 1702       Objective: Vitals: Today's Vitals   01/08/21 0630 01/08/21 0853 01/08/21 0917 01/08/21 1108  BP:   133/64   Pulse: 80  78   Resp: 15  16   Temp: 98.5 F (36.9 C)  97.6 F (36.4 C)   TempSrc: Oral  Oral   SpO2: 92% 93% 92%    PainSc:    3     Intake/Output Summary (Last 24 hours) at 01/08/2021 1218 Last data filed at 01/08/2021 2536 Gross per 24 hour  Intake 1780 ml  Output 400 ml  Net 1380 ml   There were no vitals filed for this visit. Weight change:   Intake/Output from previous day: 02/02 0701 - 02/03 0700 In: Laura [P.O.:480; I.V.:1300] Out: 400 [Urine:400] Intake/Output this shift: No intake/output data recorded. There were no vitals filed for this visit.  Examination:  General: Appearance:     Overweight female in no acute distress     Lungs:     respirations unlabored  Heart:    Normal heart rate. Normal rhythm. ? murmur   MS:   All extremities are intact.   Neurologic:   Awake, alert, pleasant and cooperative    Data Reviewed: I have personally reviewed following labs and imaging studies CBC: Recent Labs  Lab 01/06/21 0011 01/07/21 0339 01/08/21 0132  WBC 14.3* 12.6* 8.7  HGB 13.2 13.3 12.0  HCT 38.8 39.8 35.5*  MCV 87.4 88.1 88.8  PLT 212 243 644   Basic Metabolic Panel: Recent Labs  Lab 01/06/21 0011 01/06/21 1043 01/07/21 0220 01/08/21 0132  NA 130*  --  134* 133*  K 3.6  --  3.9 3.7  CL 95*  --  98 101  CO2 21*  --  23 21*  GLUCOSE 151*  --  102* 80  BUN 29*  --  28* 23  CREATININE 3.29*  --  2.81* 2.13*  CALCIUM 9.5  --  9.1 8.5*  MG  --  1.6*  --   --    GFR: CrCl cannot be calculated (Unknown ideal weight.). Liver Function Tests: Recent Labs  Lab 01/06/21 0011  AST 36  ALT 22  ALKPHOS 67  BILITOT 1.0  PROT 5.8*  ALBUMIN 2.9*   Recent Labs  Lab 01/06/21 0011  LIPASE 43   No results for input(s): AMMONIA in the last 168 hours. Coagulation Profile: No results for input(s): INR, PROTIME in the last 168 hours. Cardiac Enzymes: Recent Labs  Lab 01/07/21 0220  CKTOTAL 68   BNP (last 3 results) No results for input(s): PROBNP in the last 8760 hours. HbA1C: No results for input(s): HGBA1C in the last 72 hours. CBG: Recent Labs  Lab  01/07/21 1202 01/07/21 1733 01/07/21 2127 01/08/21 0812 01/08/21 1206  GLUCAP 94 86 113* 79 85   Lipid Profile: No results for input(s): CHOL, HDL, LDLCALC, TRIG, CHOLHDL, LDLDIRECT in the last 72 hours. Thyroid Function Tests: No results for input(s): TSH, T4TOTAL, FREET4, T3FREE, THYROIDAB in the last 72 hours. Anemia Panel: No results for input(s): VITAMINB12, FOLATE, FERRITIN, TIBC, IRON, RETICCTPCT in the last 72 hours.  Sepsis Labs: No results for input(s): PROCALCITON, LATICACIDVEN in the last 168 hours.  Recent Results (from the past 240 hour(s))  SARS Coronavirus 2 by RT PCR (hospital order, performed in [facility] hospital lab) Nasopharyngeal Nasopharyngeal Swab     Status: None   Collection Time: 01/06/21 10:42 AM   Specimen: Nasopharyngeal Swab  Result Value Ref Range Status   SARS Coronavirus 2 NEGATIVE NEGATIVE Final    Comment: (NOTE) SARS-CoV-2 target nucleic acids are NOT DETECTED.  The SARS-CoV-2 RNA is generally detectable in upper and lower respiratory specimens during the acute phase of infection. The lowest concentration of SARS-CoV-2 viral copies this assay can detect is 250 copies / mL. A negative result does not preclude SARS-CoV-2 infection and should not be used as the sole basis for treatment or other patient management decisions.  A negative result may occur with improper specimen collection / handling, submission of specimen other than nasopharyngeal swab, presence of viral mutation(s) within the areas targeted by this assay, and inadequate number of viral copies (<250 copies / mL). A negative result must be combined with clinical observations, patient history, and epidemiological information.  Fact Sheet for Patients:   BoilerBrush.com.cy  Fact Sheet for Healthcare Providers: https://pope.com/  This test is not yet approved or  cleared by the [place] FDA and has been authorized for  detection and/or diagnosis of SARS-CoV-2 by FDA under an Emergency Use Authorization (EUA).  This EUA will remain in effect (meaning this test can be used) for the duration of the COVID-19 declaration under Section 564(b)(1) of the Act, 21 U.S.C. section 360bbb-3(b)(1), unless the authorization is terminated or revoked sooner.  Performed at [facility], [address] [place], [place] [address]   Culture, Urine     Status: Abnormal   Collection Time: 01/06/21 11:49 AM   Specimen: Urine, Random  Result Value Ref Range Status   Specimen Description URINE, RANDOM  Final   Special Requests   Final    NONE Performed at [facility], [address] [place], [place] [address]    Culture >=100,000 COLONIES/mL ESCHERICHIA COLI (A)  Final   Report Status 01/08/2021 FINAL  Final   Organism ID, Bacteria ESCHERICHIA COLI (A)  Final      Susceptibility   Escherichia coli - MIC*    AMPICILLIN 4 SENSITIVE Sensitive     CEFAZOLIN <=4 SENSITIVE Sensitive     CEFEPIME <=0.12 SENSITIVE Sensitive     CEFTRIAXONE <=0.25 SENSITIVE Sensitive     CIPROFLOXACIN <=0.25 SENSITIVE Sensitive     GENTAMICIN <=1 SENSITIVE Sensitive     IMIPENEM <=0.25 SENSITIVE Sensitive     NITROFURANTOIN <=16 SENSITIVE Sensitive     TRIMETH/SULFA <=20 SENSITIVE Sensitive     AMPICILLIN/SULBACTAM <=2 SENSITIVE Sensitive     PIP/TAZO <=4 SENSITIVE Sensitive     * >=100,000 COLONIES/mL ESCHERICHIA COLI  Gastrointestinal Panel by PCR , Stool     Status: None   Collection Time: 01/06/21  5:14 PM   Specimen: Stool  Result Value Ref Range Status   Campylobacter species NOT DETECTED NOT DETECTED Final   Plesimonas shigelloides NOT DETECTED NOT DETECTED Final   Salmonella species NOT DETECTED NOT DETECTED Final   Yersinia enterocolitica NOT DETECTED NOT DETECTED Final   Vibrio species NOT DETECTED NOT DETECTED Final   Vibrio cholerae NOT DETECTED NOT DETECTED Final   Enteroaggregative E coli (EAEC) NOT DETECTED  NOT DETECTED Final   Enteropathogenic E coli (EPEC) NOT DETECTED NOT  DETECTED Final   Enterotoxigenic E coli (ETEC) NOT DETECTED NOT DETECTED Final   Shiga like toxin producing E coli (STEC) NOT DETECTED NOT DETECTED Final   Shigella/Enteroinvasive E coli (EIEC) NOT DETECTED NOT DETECTED Final   Cryptosporidium NOT DETECTED NOT DETECTED Final   Cyclospora cayetanensis NOT DETECTED NOT DETECTED Final   Entamoeba histolytica NOT DETECTED NOT DETECTED Final   Giardia lamblia NOT DETECTED NOT DETECTED Final   Adenovirus F40/41 NOT DETECTED NOT DETECTED Final   Astrovirus NOT DETECTED NOT DETECTED Final   Norovirus GI/GII NOT DETECTED NOT DETECTED Final   Rotavirus A NOT DETECTED NOT DETECTED Final   Sapovirus (I, II, IV, and V) NOT DETECTED NOT DETECTED Final    Comment: Performed at [facility], [address] [place], [place] [address]  C Difficile Quick Screen w PCR reflex     Status: None   Collection Time: 01/06/21  5:14 PM   Specimen: Stool  Result Value Ref Range Status   C Diff antigen NEGATIVE NEGATIVE Final   C Diff toxin NEGATIVE NEGATIVE Final   C Diff interpretation No C. difficile detected.  Final    Comment: Performed at [facility], [place] [address], [place], [place] [address]     Radiology Studies: CT HEAD WO CONTRAST  Result Date: 01/06/2021 CLINICAL DATA:  Neuro deficit, acute, stroke suspected Mental status change, unknown cause Patient reports dizziness and speech issues for 4 days. EXAM: CT HEAD WITHOUT CONTRAST TECHNIQUE: Contiguous axial images were obtained from the base of the skull through the vertex without intravenous contrast. COMPARISON:  Brain MRI 09/30/2020 FINDINGS: Brain: Brain volume is normal for age. No intracranial hemorrhage, mass effect, or midline shift. No hydrocephalus. The basilar cisterns are patent. 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 No evidence of territorial infarct or acute ischemia. No extra-axial or intracranial  fluid collection. Vascular: Atherosclerosis of skullbase vasculature without hyperdense vessel or abnormal calcification. Skull: No fracture or focal lesion. Sinuses/Orbits: Chronic opacification of lower mastoid air cells. Paranasal sinuses are clear. Bilateral cataract resection. Other: None. IMPRESSION: No acute intracranial abnormality. Electronically Signed   By: Keith Rake M.D.   On: 01/06/2021 20:36   MR BRAIN WO CONTRAST  Result Date: 01/07/2021 CLINICAL DATA:  Encephalopathy EXAM: MRI HEAD WITHOUT CONTRAST TECHNIQUE: Multiplanar, multiecho pulse sequences of the brain and surrounding structures were obtained without intravenous contrast. COMPARISON:  None. FINDINGS: Brain: No acute infarct, mass effect or extra-axial collection. Focus of chronic microhemorrhage in the left frontal lobe. No acute hemorrhage. There is multifocal hyperintense T2-weighted signal within the white matter. Parenchymal volume and CSF spaces are normal. Incidentally noted pericallosal lipoma, unchanged. Vascular: Major flow voids are preserved. Skull and upper cervical spine: Normal calvarium and skull base. Visualized upper cervical spine and soft tissues are normal. Sinuses/Orbits:No paranasal sinus fluid levels or advanced mucosal thickening. No mastoid or middle ear effusion. Normal orbits. IMPRESSION: 1. No acute intracranial abnormality. 2. 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 Electronically Signed   By: Ulyses Jarred M.D.   On: 01/07/2021 22:22   DG Chest Port 1 View  Result Date: 01/07/2021 CLINICAL DATA:  Hiccups and hypoxia EXAM: PORTABLE CHEST 1 VIEW COMPARISON:  06/16/2020 FINDINGS: Patient rotated right. Prior median sternotomy. Midline trachea. Mild cardiomegaly. Mitral annular calcifications. Atherosclerosis in the transverse aorta. No pleural effusion or pneumothorax. Basilar predominant interstitial thickening is similar and may relate to chronic  bronchitis or prior smoking. No lobar consolidation.  IMPRESSION: No acute cardiopulmonary disease. Aortic Atherosclerosis (ICD10-I70.0). Electronically Signed   By: Abigail Miyamoto M.D.   On: 01/07/2021 15:55     LOS: 2 days   Geradine Girt, DO [facility]  01/08/2021, 12:18 PM

## 2021-01-08 NOTE — Consult Note (Signed)
Referring Provider:  Dr. Eliseo Squires Primary Care Physician:  [name], MD Primary Gastroenterologist:  [name]  Reason for Consultation:  Diarrhea  HPI: Megan [name] is a 75 y.o. female with HTN, HLD, CAD/PAD, COPD, T2DM, history of back surgery and mild issues with balance since surgery and walks with cane at baseline.  Admitted 01/06/21 with diarrhea-nonbloody and generalized weakness x 4 days prior to admission.  Apparently had some mild confusion as well.  Found to have a leukocytosis of 14.3K, hyponatremia 130, AKI with Cr 3.2, UA positive for large leukocytes rare bacteria.  Patient was given IV fluids, stool test was negative for C. Difficile.  GI pathogen panel also negative.  She is drinking contrast for CT scan that has been ordered.  Is on Florastor probiotic.  Is on Rocephin for UTI.    She tells me that this was sudden in onset about 4 days prior to coming in.  Multiple times per day.  Lower abdominal cramping prior to BMs, none currently.  No blood in stool.  No nausea or vomiting.  Was shaky and had what sounds like shaking chills.  Unsure how many times that she has had diarrhea today.  Goes to the bathroom on her own so nurse also unaware.  Patient still seems slightly confused at times with some details being a little nonsensical while she was talking and giving me her history.  Colonoscopy 11/05/2019 with [name] showed the following:  - Two 5 to 6 mm polyps in the rectum and in the sigmoid colon, removed with a cold snare. Resected and retrieved. - Severe diverticulosis in the left colon. - Moderate diverticulosis in the right colon. - Small ascending colon AVM. - The examination was otherwise normal.  EGD 11/05/2019 with [name] showed the following:  - Normal esophagus. - Gastritis. Biopsied. - Multiple gastric polyps, typical appearance for benign fundic gland polyps. - Small hiatal hernia. - Two duodenal polyps. Resected and retrieved.  CT scan  abdomen and pelvis with contrast on 09/15/2020 showed no GI issues.   Past Medical History:  Diagnosis Date  . AAA (abdominal aortic aneurysm) ([place])    a. s/p stent grafting in 2009.  . Adenomatous colon polyp 05/2001  . Adenomatous duodenal polyp   . Allergy   . Anginal pain ([place])   . COPD (chronic obstructive pulmonary disease) ([place])    pt denies COPD  . Coronary artery disease    a. s/p CABG in 1995;  b. 05/2013 Neg MV, EF 82%;  c. 06/2013 Cath: LM nl, LAD 40-50p, LCX nl, RCA 80p/173m, VG->RCA->PDA->PLSA min irregs, LIMA->LAD atretic, vigorous LV fxn.  . Diabetes mellitus without complication (HCC)    history of, resolved. 2017  . Diverticulosis   . Eczema, dyshidrotic   . Eczema, dyshidrotic 1986   Dr. Mathis Fare  . Emphysema of lung ([place])   . Fatty liver   . Fibromyalgia   . GERD (gastroesophageal reflux disease)   . Hyperlipidemia   . Hypertension   . Migraine   . Myocardial infarction ([place])   . Osteoarthritis   . Osteoporosis    unsure, having a bone scan soon  . Peripheral arterial disease (HCC)    left leg diagnosed by Dr Mare Ferrari  . Renal artery stenosis ([place])    a. 2008 s/p PTA    Past Surgical History:  Procedure Laterality Date  . ABDOMINAL AORTAGRAM N/A 03/06/2014   Procedure: ABDOMINAL AORTAGRAM;  Surgeon: Rosetta Posner, MD;  Location: [facility] CATH LAB;  Service: Cardiovascular;  Laterality: N/A;  . ABDOMINAL AORTIC ANEURYSM REPAIR  2009   stenting  . ABDOMINAL HYSTERECTOMY     Total, 1992  . BREAST BIOPSY    . CARDIAC CATHETERIZATION    . CARDIOVASCULAR STRESS TEST  11/11/2009   normal study  . CARPAL TUNNEL RELEASE  08/1993   left wrist  . CARPAL TUNNEL RELEASE  01/1997   right  . CATARACT EXTRACTION, BILATERAL  2021  . CORONARY ARTERY BYPASS GRAFT  07/1994   Triple by Dr Ceasar Mons  . dental implants    . EYE SURGERY  03/2020   cataract  . HAND SURGERY Right 09/2002   Right hand pulley release  . LEFT HEART CATHETERIZATION WITH CORONARY/GRAFT  ANGIOGRAM N/A 06/11/2013   Procedure: LEFT HEART CATHETERIZATION WITH [name];  Surgeon: Thayer Headings, MD;  Location: [facility] CATH LAB;  Service: Cardiovascular;  Laterality: N/A;  . MULTIPLE TOOTH EXTRACTIONS  2000   All upper teeth.  Has full dneture.   [name] RENAL ARTERY STENT  2008  . SHOULDER ARTHROSCOPY WITH ROTATOR CUFF REPAIR Right 09/27/2018   Procedure: Right shoulder mini open rotator cuff repair;  Surgeon: Susa Day, MD;  Location: [facility] ORS;  Service: Orthopedics;  Laterality: Right;  90 mins  . thyroid cyst apiration  05/1997 and 07/1998  . TONSILECTOMY, ADENOIDECTOMY, BILATERAL MYRINGOTOMY AND TUBES  1989  . TONSILLECTOMY    . TOTAL ABDOMINAL HYSTERECTOMY  1992    Prior to Admission medications   Medication Sig Start Date End Date Taking? Authorizing Provider  albuterol (PROVENTIL HFA;VENTOLIN HFA) 108 (90 Base) MCG/ACT inhaler Inhale 2 puffs into the lungs every 4 (four) hours as needed for wheezing or shortness of breath. 11/13/18  Yes [name], MD  ALPRAZolam Duanne Moron) 0.25 MG tablet Take 1 tablet (0.25 mg total) by mouth at bedtime as needed for sleep. 09/15/20  Yes [place], Peter M, MD  amLODipine (NORVASC) 5 MG tablet TAKE 1 TABLET BY MOUTH EVERY DAY Patient taking differently: Take 5 mg by mouth daily. 06/12/20  Yes [place], Peter M, MD  aspirin EC 81 MG tablet Take 1 tablet (81 mg total) by mouth daily. 05/09/20  Yes [place], Peter M, MD  atorvastatin (LIPITOR) 80 MG tablet TAKE 1 TABLET BY MOUTH EVERY DAY Patient taking differently: Take 80 mg by mouth daily. 08/18/20  Yes [place], Peter M, MD  augmented betamethasone dipropionate (DIPROLENE-AF) 0.05 % cream Apply 1 application topically daily as needed (rash). For hands 10/01/15  Yes [provider]  B Complex-Biotin-FA (B-100 COMPLEX PO) Take 1 tablet by mouth at bedtime.    Yes [provider]  bismuth subsalicylate (PEPTO BISMOL) 262 MG/15ML suspension Take 30 mLs by mouth every 6 (six) hours  as needed for indigestion.   Yes [provider]  Budeson-Glycopyrrol-Formoterol (BREZTRI AEROSPHERE) 160-9-4.8 MCG/ACT AERO Inhale 2 puffs into the lungs in the morning and at bedtime. Patient taking differently: Inhale 1 puff into the lungs daily as needed (shortness og breath). 02/27/20  Yes [name], MD  ciclopirox (LOPROX) 0.77 % cream Apply 1 application topically daily as needed (hands fungus).    Yes [provider]  CORDRAN 4 MCG/SQCM TAPE Apply 1 each topically daily as needed (Rash).  05/17/19  Yes [provider]  cycloSPORINE (RESTASIS) 0.05 % ophthalmic emulsion Place 1 drop into both eyes 2 (two) times daily.    Yes [provider]  gabapentin (NEURONTIN) 300 MG capsule Take  1-2 capsules (300-600 mg total) by mouth 3 (three) times daily. Patient taking differently: Take 300 mg by mouth 2 (two) times daily. 11/15/19  Yes Lomax, Amy, NP  hydrALAZINE (APRESOLINE) 10 MG tablet TAKE 1 TABLET BY MOUTH THREE TIMES A DAY Patient taking differently: Take 10 mg by mouth 2 (two) times daily. 01/21/20  Yes [place], Peter M, MD  losartan-hydrochlorothiazide (HYZAAR) 100-25 MG tablet Take 1 tablet by mouth daily. 01/21/20  Yes [place], Peter M, MD  meloxicam (MOBIC) 15 MG tablet Take 15 mg by mouth daily. 12/13/20  Yes [provider]  Multiple Vitamins-Minerals (MULTIVITAMIN WITH MINERALS) tablet Take 1 tablet by mouth at bedtime.   Yes [provider]  nitroGLYCERIN (NITROSTAT) 0.4 MG SL tablet Place 1 tablet (0.4 mg total) under the tongue every 5 (five) minutes as needed for chest pain. Patient taking differently: Place 0.4 mg under the tongue every 5 (five) minutes as needed for chest pain (max 3 doses). 01/09/20  Yes [place], Peter M, MD  omeprazole (PRILOSEC) 40 MG capsule TAKE 1 CAPSULE BY MOUTH TWICE A DAY--NEED APPT FOR FURTHER REFILLS Patient taking differently: Take 40 mg by mouth 2 (two) times daily. 12/15/20  Yes Ladene Artist, MD   oxyCODONE-acetaminophen (PERCOCET) 10-325 MG tablet Take 1 tablet by mouth every 6 (six) hours as needed for pain. Patient taking differently: Take 1 tablet by mouth daily as needed for pain. 06/19/20 06/19/21 Yes Meyran, Ocie Cornfield, NP  potassium chloride (KLOR-CON) 10 MEQ tablet TAKE 2 TABLETS (20 MEQ TOTAL) BY MOUTH 2 (TWO) TIMES DAILY. Patient taking differently: Take 10 mEq by mouth 2 (two) times daily. 04/22/20  Yes [place], Peter M, MD  torsemide (DEMADEX) 20 MG tablet Take 1/2 tablet daily Patient taking differently: Take 10 mg by mouth daily as needed (fluid). 02/18/20  Yes [place], Peter M, MD  tretinoin (RETIN-A) 0.1 % cream Apply 1 application topically every other day. In the evening   Yes [provider]  VITAMIN D, ERGOCALCIFEROL, PO Take 1,000 Units by mouth every evening.    Yes [provider]  Accu-Chek FastClix Lancets MISC Check blood sugar once daily PRN 07/30/19   [name], MD  glucose blood (ACCU-CHEK SMARTVIEW) test strip CHECK BLOOD SUGAR ONCE DAILY AS NEEDED 01/17/20   [name], MD  methocarbamol (ROBAXIN) 500 MG tablet Take 1 tablet (500 mg total) by mouth 4 (four) times daily. Patient not taking: No sig reported 06/19/20   Meyran, Ocie Cornfield, NP    Current Facility-Administered Medications  Medication Dose Route Frequency Provider Last Rate Last Admin  . 0.9 %  sodium chloride infusion   Intravenous Continuous Fuller Plan A, MD 100 mL/hr at 01/07/21 0909 New Bag at 01/07/21 0909  . acetaminophen (TYLENOL) tablet 650 mg  650 mg Oral Q6H PRN Fuller Plan A, MD   650 mg at 01/08/21 [id number]   Or  . acetaminophen (TYLENOL) suppository 650 mg  650 mg Rectal Q6H PRN Fuller Plan A, MD      . oxyCODONE (Oxy IR/ROXICODONE) immediate release tablet 10 mg  10 mg Oral Daily PRN Fuller Plan A, MD   10 mg at 01/08/21 1008   And  . acetaminophen (TYLENOL) tablet 325 mg  325 mg Oral Daily PRN Smith, Rondell A, MD      . albuterol  (PROVENTIL) (2.5 MG/3ML) 0.083% nebulizer solution 2.5 mg  2.5 mg Nebulization Q6H PRN Norval Morton, MD      . ALPRAZolam Duanne Moron) tablet  0.25 mg  0.25 mg Oral QHS PRN Fuller Plan A, MD      . alum & mag hydroxide-simeth (MAALOX/MYLANTA) 200-200-20 MG/5ML suspension 15 mL  15 mL Oral Q6H PRN Kc, Ramesh, MD      . amLODipine (NORVASC) tablet 5 mg  5 mg Oral Daily Tamala Julian, Rondell A, MD   5 mg at 01/08/21 0957  . aspirin EC tablet 81 mg  81 mg Oral Daily Tamala Julian, Rondell A, MD   81 mg at 01/08/21 0957  . atorvastatin (LIPITOR) tablet 80 mg  80 mg Oral Daily Fuller Plan A, MD   80 mg at 01/08/21 0957  . cefTRIAXone (ROCEPHIN) 2 g in sodium chloride 0.9 % 100 mL IVPB  2 g Intravenous Q24H Smith, Rondell A, MD 200 mL/hr at 01/07/21 1833 2 g at 01/07/21 1833  . cycloSPORINE (RESTASIS) 0.05 % ophthalmic emulsion 1 drop  1 drop Both Eyes BID Fuller Plan A, MD   1 drop at 01/08/21 0957  . fluticasone furoate-vilanterol (BREO ELLIPTA) 100-25 MCG/INH 1 puff  1 puff Inhalation Daily Smith, Rondell A, MD   1 puff at 01/08/21 0853   And  . umeclidinium bromide (INCRUSE ELLIPTA) 62.5 MCG/INH 1 puff  1 puff Inhalation Daily Fuller Plan A, MD   1 puff at 01/08/21 0853  . heparin injection 5,000 Units  5,000 Units Subcutaneous Q8H Smith, Rondell A, MD   5,000 Units at 01/08/21 0500  . hydrALAZINE (APRESOLINE) tablet 10 mg  10 mg Oral BID Fuller Plan A, MD   10 mg at 01/08/21 0957  . hydrocortisone cream 1 %   Topical PRN Fuller Plan A, MD      . loperamide (IMODIUM) capsule 4 mg  4 mg Oral PRN Lovey Newcomer T, NP   4 mg at 01/08/21 0636  . ondansetron (ZOFRAN) tablet 4 mg  4 mg Oral Q6H PRN Fuller Plan A, MD       Or  . ondansetron (ZOFRAN) injection 4 mg  4 mg Intravenous Q6H PRN Fuller Plan A, MD   4 mg at 01/06/21 1336  . pantoprazole (PROTONIX) EC tablet 40 mg  40 mg Oral Daily Fuller Plan A, MD   40 mg at 01/08/21 0957  . saccharomyces boulardii (FLORASTOR) capsule 250 mg  250 mg Oral  BID Fuller Plan A, MD   250 mg at 01/08/21 0957  . sodium chloride flush (NS) 0.9 % injection 3 mL  3 mL Intravenous Q12H Smith, Rondell A, MD   3 mL at 01/08/21 0958  . tretinoin (RETIN-A) 0.1 % cream 1 application  1 application Topical QODAY Smith, Rondell A, MD      . white petrolatum (VASELINE) gel   Topical PRN Lovey Newcomer T, NP   0.2 application at [id number]    Allergies as of 01/05/2021 - Review Complete 01/05/2021  Allergen Reaction Noted  . Anticoagulant compound Itching and Other (See Comments) 06/12/2014  . Morphine and related Nausea Only and Itching 08/17/2011  . Bextra [valdecoxib] Other (See Comments) 05/14/2011  . Hydrocod polst-cpm polst er Rash 02/25/2016  . Lipitor [atorvastatin] Other (See Comments) 09/20/2013  . Mevacor [lovastatin] Other (See Comments) 05/14/2011  . Plavix  [clopidogrel bisulfate] Itching and Other (See Comments) 05/08/2015  . Zocor [simvastatin] Other (See Comments) 05/14/2011  . Doxycycline Diarrhea 06/12/2014  . Metoprolol Other (See Comments) 01/17/2013  . Plavix [clopidogrel bisulfate] Other (See Comments) 05/14/2011  . Codeine Itching and Rash 09/08/2009  . Hydrocod polst-cpm polst er Rash  05/14/2011  . Lisinopril Cough 05/14/2011    Family History  Problem Relation Age of Onset  . Heart disease Mother   . Heart attack Mother   . Heart disease Father        before age 70  . Heart attack Father   . Heart disease Sister        before age 40  . Diabetes Sister   . Hyperlipidemia Sister   . Hypertension Sister   . Heart attack Sister   . Liver cancer Sister   . Breast cancer Sister   . Heart disease Brother   . Diabetes Brother   . Hyperlipidemia Brother   . Hypertension Brother   . Heart attack Brother   . AAA (abdominal aortic aneurysm) Brother   . Colon cancer Paternal Grandmother   . Heart disease Brother   . Colon cancer Cousin        first cousin on father's side  . Pancreatic cancer Neg Hx   . Rectal cancer  Neg Hx   . Stomach cancer Neg Hx   . Esophageal cancer Neg Hx     Social History   Socioeconomic History  . Marital status: Married    Spouse name: Not on file  . Number of children: Not on file  . Years of education: Not on file  . Highest education level: Not on file  Occupational History  . Not on file  Tobacco Use  . Smoking status: Former Smoker    Packs/day: 0.00    Years: 37.00    Pack years: 0.00    Types: Cigarettes    Quit date: 12/07/1995    Years since quitting: 25.1  . Smokeless tobacco: Never Used  Vaping Use  . Vaping Use: Never used  Substance and Sexual Activity  . Alcohol use: Yes    Alcohol/week: 6.0 - 10.0 standard drinks    Types: 6 - 10 Glasses of wine per week    Comment: 2-3 glasses daily  . Drug use: No  . Sexual activity: Not Currently    Partners: Male  Other Topics Concern  . Not on file  Social History Narrative   Lives at home with husband.  Is retired.  Education 4 yrs of college.  No children.   Caffiene 2 cups daily.    Social Determinants of Health   Financial Resource Strain: Low Risk   . Difficulty of Paying Living Expenses: Not hard at all  Food Insecurity: No Food Insecurity  . Worried About Charity fundraiser in the Last Year: Never true  . Ran Out of Food in the Last Year: Never true  Transportation Needs: No Transportation Needs  . Lack of Transportation (Medical): No  . Lack of Transportation (Non-Medical): No  Physical Activity: Insufficiently Active  . Days of Exercise per Week: 4 days  . Minutes of Exercise per Session: 10 min  Stress: No Stress Concern Present  . Feeling of Stress : Only a little  Social Connections: Moderately Isolated  . Frequency of Communication with Friends and Family: More than three times a week  . Frequency of Social Gatherings with Friends and Family: Three times a week  . Attends Religious Services: Never  . Active Member of Clubs or Organizations: No  . Attends Archivist  Meetings: Never  . Marital Status: Married  Human resources officer Violence: Not At Risk  . Fear of Current or Ex-Partner: No  . Emotionally Abused: No  . Physically Abused: No  .  Sexually Abused: No    Review of Systems: ROS is O/W negative except as mentioned in HPI.  Physical Exam: Vital signs in last 24 hours: Temp:  [97.3 F (36.3 C)-101.6 F (38.7 C)] 97.6 F (36.4 C) (02/03 0917) Pulse Rate:  [68-92] 78 (02/03 0917) Resp:  [15-16] 16 (02/03 0917) BP: (101-152)/(44-64) 133/64 (02/03 0917) SpO2:  [92 %-96 %] 92 % (02/03 0917) Last BM Date: 01/06/21 General:  Alert, Well-developed, well-nourished, pleasant and cooperative in NAD Head:  Normocephalic and atraumatic. Eyes:  Sclera clear, no icterus.   Conjunctiva pink. Ears:  Normal auditory acuity. Mouth:  No deformity or lesions.   Lungs:  Clear throughout to auscultation.  No wheezes, crackles, or rhonchi.  Heart:  Regular rate and rhythm; no murmurs, clicks, rubs, or gallops. Abdomen:  Soft, non-distended.  BS present.  Non-tender. Msk:  Symmetrical without gross deformities. Pulses:  Normal pulses noted. Extremities:  Without clubbing or edema. Neurologic:  Alert and oriented x 4;  grossly normal neurologically. Skin:  Intact without significant lesions or rashes. Psych:  Alert and cooperative. Normal mood and affect.  Intake/Output from previous day: 02/02 0701 - 02/03 0700 In: 1780 [P.O.:480; I.V.:1300] Out: 400 [Urine:400]  Lab Results: Recent Labs    01/06/21 0011 01/07/21 0339 01/08/21 0132  WBC 14.3* 12.6* 8.7  HGB 13.2 13.3 12.0  HCT 38.8 39.8 35.5*  PLT 212 243 218   BMET Recent Labs    01/06/21 0011 01/07/21 0220 01/08/21 0132  NA 130* 134* 133*  K 3.6 3.9 3.7  CL 95* 98 101  CO2 21* 23 21*  GLUCOSE 151* 102* 80  BUN 29* 28* 23  CREATININE 3.29* 2.81* 2.13*  CALCIUM 9.5 9.1 8.5*   LFT Recent Labs    01/06/21 0011  PROT 5.8*  ALBUMIN 2.9*  AST 36  ALT 22  ALKPHOS 67  BILITOT 1.0    Studies/Results: CT HEAD WO CONTRAST  Result Date: 01/06/2021 CLINICAL DATA:  Neuro deficit, acute, stroke suspected Mental status change, unknown cause Patient reports dizziness and speech issues for 4 days. EXAM: CT HEAD WITHOUT CONTRAST TECHNIQUE: Contiguous axial images were obtained from the base of the skull through the vertex without intravenous contrast. COMPARISON:  Brain MRI 09/30/2020 FINDINGS: Brain: Brain volume is normal for age. No intracranial hemorrhage, mass effect, or midline shift. No hydrocephalus. The basilar cisterns are patent. 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 No evidence of territorial infarct or acute ischemia. No extra-axial or intracranial fluid collection. Vascular: Atherosclerosis of skullbase vasculature without hyperdense vessel or abnormal calcification. Skull: No fracture or focal lesion. Sinuses/Orbits: Chronic opacification of lower mastoid air cells. Paranasal sinuses are clear. Bilateral cataract resection. Other: None. IMPRESSION: No acute intracranial abnormality. Electronically Signed   By: Narda RutherfordMelanie  Sanford M.D.   On: 01/06/2021 20:36   MR BRAIN WO CONTRAST  Result Date: 01/07/2021 CLINICAL DATA:  Encephalopathy EXAM: MRI HEAD WITHOUT CONTRAST TECHNIQUE: Multiplanar, multiecho pulse sequences of the brain and surrounding structures were obtained without intravenous contrast. COMPARISON:  None. FINDINGS: Brain: No acute infarct, mass effect or extra-axial collection. Focus of chronic microhemorrhage in the left frontal lobe. No acute hemorrhage. There is multifocal hyperintense T2-weighted signal within the white matter. Parenchymal volume and CSF spaces are normal. Incidentally noted pericallosal lipoma, unchanged. Vascular: Major flow voids are preserved. Skull and upper cervical spine: Normal calvarium and skull base. Visualized upper cervical spine and soft tissues are normal. Sinuses/Orbits:No paranasal sinus fluid levels or advanced mucosal  thickening. No  mastoid or middle ear effusion. Normal orbits. IMPRESSION: 1. No acute intracranial abnormality. 2. 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 Electronically Signed   By: Ulyses Jarred M.D.   On: 01/07/2021 22:22   DG Chest Port 1 View  Result Date: 01/07/2021 CLINICAL DATA:  Hiccups and hypoxia EXAM: PORTABLE CHEST 1 VIEW COMPARISON:  06/16/2020 FINDINGS: Patient rotated right. Prior median sternotomy. Midline trachea. Mild cardiomegaly. Mitral annular calcifications. Atherosclerosis in the transverse aorta. No pleural effusion or pneumothorax. Basilar predominant interstitial thickening is similar and may relate to chronic bronchitis or prior smoking. No lobar consolidation. IMPRESSION: No acute cardiopulmonary disease. Aortic Atherosclerosis (ICD10-I70.0). Electronically Signed   By: Abigail Miyamoto M.D.   On: 01/07/2021 15:55   DG ABD ACUTE 2+V W 1V CHEST  Result Date: 01/06/2021 CLINICAL DATA:  Diarrhea. EXAM: DG ABDOMEN ACUTE WITH 1 VIEW CHEST COMPARISON:  Chest x-ray dated June 16, 2020. FINDINGS: There is no evidence of dilated bowel loops or free intraperitoneal air. No radiopaque calculi or other significant radiographic abnormality is seen. Prior EVAR and lumbar fusion. Heart size and mediastinal contours are within normal limits. Atherosclerotic calcification of the aortic arch. Prior CABG. Both lungs are clear. IMPRESSION: Negative abdominal radiographs.  No acute cardiopulmonary disease. Electronically Signed   By: Titus Dubin M.D.   On: 01/06/2021 12:19   IMPRESSION:  *Diarrhea:  Acute onset.  Cdiff and GI pathogen panel negative.  Initially had WBC count at 14K, now resolved.  CT scan has been ordered by the hospitalist.  This certainly sounds infectious. *Hyponatremia and AKI:  Likely from dehydration.  Improving.  PLAN: -Await results of CT scan. -Continue probiotic (Florastor). -Continue supportive care, Imodium prn,etc  for now.   Laban Emperor. Styles Fambro  01/08/2021, 11:18 AM

## 2021-01-08 NOTE — Progress Notes (Signed)
PCT reached out to me reporting low o2 sats and high BP on this patient during rounds. Reassessed and noticed patient had increased work of breathing, O2 sats in the 80's on 2 L of O2, widened blood pressure and temp of 103 F. Reached out to provider and informed her of these findings. She ordered to stop continuous fluids, give and give her a dose of IV lasix 40 MG. Also put in order to transfer to progressive care. Will continue to monitor.

## 2021-01-09 ENCOUNTER — Inpatient Hospital Stay (HOSPITAL_COMMUNITY): Payer: Medicare Other

## 2021-01-09 ENCOUNTER — Encounter (HOSPITAL_COMMUNITY): Payer: Self-pay | Admitting: Internal Medicine

## 2021-01-09 DIAGNOSIS — R011 Cardiac murmur, unspecified: Secondary | ICD-10-CM

## 2021-01-09 LAB — MAGNESIUM: Magnesium: 1.6 mg/dL — ABNORMAL LOW (ref 1.7–2.4)

## 2021-01-09 LAB — CBC
HCT: 34.9 % — ABNORMAL LOW (ref 36.0–46.0)
Hemoglobin: 11.5 g/dL — ABNORMAL LOW (ref 12.0–15.0)
MCH: 29.1 pg (ref 26.0–34.0)
MCHC: 33 g/dL (ref 30.0–36.0)
MCV: 88.4 fL (ref 80.0–100.0)
Platelets: 235 10*3/uL (ref 150–400)
RBC: 3.95 MIL/uL (ref 3.87–5.11)
RDW: 12.9 % (ref 11.5–15.5)
WBC: 12 10*3/uL — ABNORMAL HIGH (ref 4.0–10.5)
nRBC: 0 % (ref 0.0–0.2)

## 2021-01-09 LAB — GLUCOSE, CAPILLARY
Glucose-Capillary: 141 mg/dL — ABNORMAL HIGH (ref 70–99)
Glucose-Capillary: 139 mg/dL — ABNORMAL HIGH (ref 70–99)
Glucose-Capillary: 110 mg/dL — ABNORMAL HIGH (ref 70–99)
Glucose-Capillary: 137 mg/dL — ABNORMAL HIGH (ref 70–99)

## 2021-01-09 LAB — BASIC METABOLIC PANEL
Anion gap: 12 (ref 5–15)
BUN: 20 mg/dL (ref 8–23)
CO2: 20 mmol/L — ABNORMAL LOW (ref 22–32)
Calcium: 8.6 mg/dL — ABNORMAL LOW (ref 8.9–10.3)
Chloride: 103 mmol/L (ref 98–111)
Creatinine, Ser: 1.65 mg/dL — ABNORMAL HIGH (ref 0.44–1.00)
GFR, Estimated: 32 mL/min — ABNORMAL LOW (ref >60)
Glucose, Bld: 86 mg/dL (ref 70–99)
Potassium: 3.3 mmol/L — ABNORMAL LOW (ref 3.5–5.1)
Sodium: 135 mmol/L (ref 135–145)

## 2021-01-09 LAB — ECHOCARDIOGRAM COMPLETE
Area-P 1/2: 2.66 cm2
MV VTI: 2.25 cm2
S' Lateral: 2.5 cm

## 2021-01-09 LAB — VITAMIN B12: Vitamin B-12: 1262 pg/mL — ABNORMAL HIGH (ref 180–914)

## 2021-01-09 MED ORDER — AMOXICILLIN-POT CLAVULANATE 875-125 MG PO TABS
1.0000 | ORAL_TABLET | Freq: Two times a day (BID) | ORAL | Status: DC
  Administered 2021-01-09 – 2021-01-10 (×3): 1 via ORAL
  Filled 2021-01-09 (×3): qty 1

## 2021-01-09 MED ORDER — FUROSEMIDE 10 MG/ML IJ SOLN: 40.0000 mg | Freq: Once | INTRAMUSCULAR | Status: DC

## 2021-01-09 MED ORDER — TRAZODONE HCL 50 MG PO TABS
50.0000 mg | ORAL_TABLET | Freq: Every evening | ORAL | Status: DC | PRN
  Administered 2021-01-09 – 2021-01-10 (×2): 50 mg via ORAL
  Filled 2021-01-09 (×2): qty 1

## 2021-01-09 MED ORDER — POTASSIUM CHLORIDE CRYS ER 20 MEQ PO TBCR
40.0000 meq | EXTENDED_RELEASE_TABLET | Freq: Once | ORAL | Status: AC
  Administered 2021-01-09: 40 meq via ORAL
  Filled 2021-01-09: qty 2

## 2021-01-09 MED ORDER — MAGNESIUM SULFATE 2 GM/50ML IV SOLN
2.0000 g | Freq: Once | INTRAVENOUS | Status: AC
  Administered 2021-01-09: 2 g via INTRAVENOUS
  Filled 2021-01-09: qty 50

## 2021-01-09 MED ORDER — ORAL CARE MOUTH RINSE
15.0000 mL | Freq: Two times a day (BID) | OROMUCOSAL | Status: DC
  Administered 2021-01-09 – 2021-01-13 (×6): 15 mL via OROMUCOSAL

## 2021-01-09 NOTE — Progress Notes (Signed)
Daily Rounding Note  01/09/2021, 9:57 AM  LOS: 3 days   SUBJECTIVE:   Chief complaint:      Overnight and first thing this morning the stools were beginning to get some substance, they were soft, unformed.  However within the hour she again had a watery stool.  She has lower abdominal discomfort but not severe pain which she says is her norm when she has had diverticulitis in the past.  Tells me that in the past because of diverticulitis surgery was being contemplated. Temperature max yest PM 103F/39.4C, 100 point 97F/38.2 see this morning.  OBJECTIVE:         Vital signs in last 24 hours:    Temp:  [97.9 F (36.6 C)-103 F (39.4 C)] 97.9 F (36.6 C) (02/04 0832) Pulse Rate:  [72-101] 76 (02/04 0832) Resp:  [13-24] 20 (02/04 0832) BP: (91-187)/(39-83) 91/49 (02/04 0832) SpO2:  [81 %-98 %] 93 % (02/04 0832) Last BM Date: 01/07/21 There were no vitals filed for this visit. General: looks well   Heart: RRR Chest: clear bil.   No dyspnea, no cough Abdomen: Soft, nondistended, active bowel sounds.  Mild tenderness in the lower abdomen, most prominent in the left lower quadrant.  No guarding or rebound. Extremities: No CCE. Neuro/Psych: Alert.  Oriented x3.  Appropriate.  Calm, pleasant.  Intake/Output from previous day: 02/03 0701 - 02/04 0700 In: 363 [P.O.:360; I.V.:3] Out: -   Intake/Output this shift: Total I/O In: 270 [P.O.:270] Out: -   Lab Results: Recent Labs    01/07/21 0339 01/08/21 0132 01/09/21 0124  WBC 12.6* 8.7 12.0*  HGB 13.3 12.0 11.5*  HCT 39.8 35.5* 34.9*  PLT 243 218 235   BMET Recent Labs    01/07/21 0220 01/08/21 0132 01/09/21 0124  NA 134* 133* 135  K 3.9 3.7 3.3*  CL 98 101 103  CO2 23 21* 20*  GLUCOSE 102* 80 86  BUN 28* 23 20  CREATININE 2.81* 2.13* 1.65*  CALCIUM 9.1 8.5* 8.6*   LFT No results for input(s): PROT, ALBUMIN, AST, ALT, ALKPHOS, BILITOT, BILIDIR, IBILI in the  last 72 hours. PT/INR No results for input(s): LABPROT, INR in the last 72 hours. Hepatitis Panel No results for input(s): HEPBSAG, HCVAB, HEPAIGM, HEPBIGM in the last 72 hours.  Studies/Results: CT ABDOMEN PELVIS WO CONTRAST  Result Date: 01/08/2021 CLINICAL DATA:  Abdominal pain and diarrhea EXAM: CT ABDOMEN AND PELVIS WITHOUT CONTRAST TECHNIQUE: Multidetector CT imaging of the abdomen and pelvis was performed following the standard protocol without IV contrast. COMPARISON:  Chest x-ray from earlier in the same day. FINDINGS: Lower chest: Mild emphysematous changes are noted. Mild bibasilar atelectatic changes are seen. Hepatobiliary: Gallbladder is well distended without evidence of cholelithiasis. No inflammatory changes are seen. The liver is unremarkable. Pancreas: Pancreas is atrophic but otherwise within normal limits. Spleen: Scattered calcified granulomas are noted in the spleen. Adrenals/Urinary Tract: Adrenal glands are within normal limits. Nonobstructing left renal calculi are noted measuring 1-2 mm. No ureteral stones are seen. The bladder is partially distended. Stomach/Bowel: Diffuse diverticulosis of the sigmoid colon is noted. Wall thickening is noted consistent with smooth muscle hypertrophy. Minimal pericolonic inflammatory changes are seen without abscess or perforation. No obstructive changes are noted. The appendix is within normal limits. No inflammatory changes are seen. The small bowel and stomach are unremarkable. Vascular/Lymphatic: Atherosclerotic calcifications of the aorta are noted. Stent graft is noted in place. Aneurysm sac again measures  4 cm but stable. No significant lymphadenopathy is noted. Reproductive: Status post hysterectomy. No adnexal masses. Other: No abdominal wall hernia or abnormality. No abdominopelvic ascites. Musculoskeletal: Postoperative and degenerative changes of lumbar spine are noted. IMPRESSION: Mild bibasilar atelectatic changes. Changes of mild  diverticulitis in the sigmoid colon. No abscess or perforation is noted. Of prior aortic stent graft therapy. 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 Electronically Signed   By: Inez Catalina M.D.   On: 01/08/2021 19:40   MR BRAIN WO CONTRAST  Result Date: 01/07/2021 CLINICAL DATA:  Encephalopathy EXAM: MRI HEAD WITHOUT CONTRAST TECHNIQUE: Multiplanar, multiecho pulse sequences of the brain and surrounding structures were obtained without intravenous contrast. COMPARISON:  None. FINDINGS: Brain: No acute infarct, mass effect or extra-axial collection. Focus of chronic microhemorrhage in the left frontal lobe. No acute hemorrhage. There is multifocal hyperintense T2-weighted signal within the white matter. Parenchymal volume and CSF spaces are normal. Incidentally noted pericallosal lipoma, unchanged. Vascular: Major flow voids are preserved. Skull and upper cervical spine: Normal calvarium and skull base. Visualized upper cervical spine and soft tissues are normal. Sinuses/Orbits:No paranasal sinus fluid levels or advanced mucosal thickening. No mastoid or middle ear effusion. Normal orbits. IMPRESSION: 1. No acute intracranial abnormality. 2. 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 Electronically Signed   By: Ulyses Jarred M.D.   On: 01/07/2021 22:22   DG CHEST PORT 1 VIEW  Result Date: 01/08/2021 CLINICAL DATA:  Hypoxia. EXAM: PORTABLE CHEST 1 VIEW COMPARISON:  Chest x-ray 07/27/2021 FINDINGS: The cardiac silhouette, mediastinal and hilar contours are within normal limits and stable given the AP projection and portable technique. 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 Low lung volumes with vascular crowding and streaky atelectasis. No infiltrates or effusions. No pulmonary edema. Mild to moderate eventration of the right hemidiaphragm. The bony thorax is intact. IMPRESSION: Low lung volumes with vascular crowding and streaky atelectasis.  Electronically Signed   By: Marijo Sanes M.D.   On: 01/08/2021 15:47   DG Chest Port 1 View  Result Date: 01/07/2021 CLINICAL DATA:  Hiccups and hypoxia EXAM: PORTABLE CHEST 1 VIEW COMPARISON:  06/16/2020 FINDINGS: Patient rotated right. Prior median sternotomy. Midline trachea. Mild cardiomegaly. Mitral annular calcifications. Atherosclerosis in the transverse aorta. No pleural effusion or pneumothorax. Basilar predominant interstitial thickening is similar and may relate to chronic bronchitis or prior smoking. No lobar consolidation. IMPRESSION: No acute cardiopulmonary disease. Aortic Atherosclerosis (ICD10-I70.0). Electronically Signed   By: Abigail Miyamoto M.D.   On: 01/07/2021 15:55   Scheduled Meds: . amoxicillin-clavulanate  1 tablet Oral Q12H  . aspirin EC  81 mg Oral Daily  . atorvastatin  80 mg Oral Daily  . cycloSPORINE  1 drop Both Eyes BID  . fluticasone furoate-vilanterol  1 puff Inhalation Daily   And  . umeclidinium bromide  1 puff Inhalation Daily  . heparin  5,000 Units Subcutaneous Q8H  . mouth rinse  15 mL Mouth Rinse BID  . pantoprazole  40 mg Oral Daily  . saccharomyces boulardii  250 mg Oral BID  . sodium chloride flush  3 mL Intravenous Q12H   Continuous Infusions: . magnesium sulfate bolus IVPB 2 g (01/09/21 0918)   PRN Meds:.acetaminophen **OR** acetaminophen, oxyCODONE **AND** acetaminophen, albuterol, ALPRAZolam, alum & mag hydroxide-simeth, hydrocortisone cream, loperamide, ondansetron **OR** ondansetron (ZOFRAN) IV, white petrolatum   ASSESMENT:   *   Acute diarrhea.  C. difficile, stool PCR panel negative.  Fever Leukocytosis had resolved  but went from 8.7 >> 12 in the last 24 hours. 01/08/2021  CTAP w/o contrast: Sigmoid diverticulitis without complication.  Previous aortic stent graft, aneurysm sac stable at 4 cm. 2 d ceftriaxone >> 1 day cephalexin >> now day 1 Augmentin.  *    AKI/GFR improving, hyponatremia resolved, likely from  dehydration. Hypokalemia, new.   PLAN   *   Continue Augmentin for now.  Going to discuss antibiotics with Dr. Rush Landmark. Regular diet.    Azucena Freed  01/09/2021, 9:57 AM Phone [phone]

## 2021-01-09 NOTE — Plan of Care (Signed)
  Problem: Education: Goal: Knowledge of General Education information will improve Description: Including pain rating scale, medication(s)/side effects and non-pharmacologic comfort measures 01/09/2021 1311 by Thressa Sheller, RN Outcome: Progressing 01/09/2021 1310 by Thressa Sheller, RN Outcome: Progressing 01/09/2021 1308 by Thressa Sheller, RN Outcome: Progressing 01/09/2021 1304 by Thressa Sheller, RN Outcome: Progressing   Problem: Health Behavior/Discharge Planning: Goal: Ability to manage health-related needs will improve Outcome: Progressing   Problem: Clinical Measurements: Goal: Ability to maintain clinical measurements within normal limits will improve Outcome: Progressing Goal: Will remain free from infection 01/09/2021 1311 by Thressa Sheller, RN Outcome: Progressing 01/09/2021 1308 by Thressa Sheller, RN Outcome: Progressing 01/09/2021 1304 by Thressa Sheller, RN Outcome: Progressing Goal: Diagnostic test results will improve Outcome: Progressing Goal: Respiratory complications will improve 01/09/2021 1311 by Thressa Sheller, RN Outcome: Progressing 01/09/2021 1308 by Thressa Sheller, RN Outcome: Progressing 01/09/2021 1304 by Thressa Sheller, RN Outcome: Progressing Goal: Cardiovascular complication will be avoided 01/09/2021 1308 by Thressa Sheller, RN Outcome: Progressing 01/09/2021 1304 by Thressa Sheller, RN Outcome: Progressing   Problem: Activity: Goal: Risk for activity intolerance will decrease 01/09/2021 1308 by Thressa Sheller, RN Outcome: Progressing 01/09/2021 1304 by Thressa Sheller, RN Outcome: Progressing   Problem: Nutrition: Goal: Adequate nutrition will be maintained Outcome: Progressing   Problem: Coping: Goal: Level of anxiety will decrease Outcome: Progressing   Problem: Elimination: Goal: Will not experience complications related to bowel motility 01/09/2021 1308 by Thressa Sheller, RN Outcome:  Progressing 01/09/2021 1304 by Thressa Sheller, RN Outcome: Progressing Goal: Will not experience complications related to urinary retention Outcome: Progressing   Problem: Pain Managment: Goal: General experience of comfort will improve Outcome: Progressing   Problem: Safety: Goal: Ability to remain free from injury will improve 01/09/2021 1311 by Thressa Sheller, RN Outcome: Progressing 01/09/2021 1308 by Thressa Sheller, RN Outcome: Progressing 01/09/2021 1304 by Thressa Sheller, RN Outcome: Progressing   Problem: Skin Integrity: Goal: Risk for impaired skin integrity will decrease 01/09/2021 1308 by Thressa Sheller, RN Outcome: Progressing 01/09/2021 1304 by Thressa Sheller, RN Outcome: Progressing

## 2021-01-09 NOTE — Progress Notes (Addendum)
PROGRESS NOTE    Megan [name]  [id number] DOB: 12/31/1945 DOA: 01/05/2021 PCP: [name], MD   Chief Complaint  Patient presents with  . Diarrhea  . Nausea    Brief Narrative: 75 year old female with HTN, HLD, CAD/PAD, COPD, T2DM, history of back surgery and mildly issues with balance since surgeryn and walks with cane at baseline, admitted 01/06/21 with diarrhea-nonbloody and generalized weakness x4 days prior to admission.   Found to have UTI but her diarrhea has continued so work up in progress.  Patient also had episode of fluid overload/acute respiratory failure.   Subjective: C/o visual hallucinations when she closes her eyes  Assessment & Plan:  ARF:  -Likely pre-renal/volume depletion due to diarrhea along with home meds HCTZ/losartan and torsemide -daily labs  -stop IVF -improved with IV lasix  Diarrhea: Negative for C. difficile, GI panel negative. -on lactobacillus -check CT scan- ? Diverticulitis -on abx -GI consult -poor PO intake continues -check pre-albumin  Acute metabolic encephalopathy/memory issues recurrent:  -MRI brain negative -? Delirium as it seems to wax and wane  E coli UTI POA:  -on augmentin  Acute hypoxic respiratory failure  -SPO2 dropped in mid 80s needing 1 L nasal cannula  -add incentive spirometry and MOBILIZE patient -BNP elevated- responded well to IV lasix-- dose daily -echo: EF [id number], type 1 diastolic  Hyponatremia, mild at 134 from prerenal dehydration.  T2DM:Last hemoglobin 7.9 stable June 13, 2020, also well controlled 90.  Would allow regular diet.  HOLD MEDS  GERD: Continue PPI  Essential hypertension/history of renal artery stenosis.  Blood pressure is controlled.  Continue amlodipine and.  Hydralazine for now.  Holding diuretics. -dose lasix daily  CAD/PAD history of CABG 1995 on a statin and aspirin.  COPD without acute exacerbation continue home inhalers nebulizer  History of AAA status post  stenting of the abdominal 27,009 outpatient follow-up with vascular.  Had a renal ultrasound in June 2019  HLD on Lipitor.   DVT prophylaxis: heparin injection 5,000 Units Start: 01/06/21 1400 Code Status:   Code Status: Full Code  Family Communication: plan of care discussed with patient   Status is: Inpatient Remains inpatient appropriate because:Inpatient level of care appropriate due to severity of illness Spoke with MPOA: Graylin Shiver Dispo: The patient is from: Home              Anticipated d/c is to: Home              Anticipated d/c date is: 2 days              Patient currently is not medically stable to d/c. GI sign off, PT eval   Difficult to place patient No  Consultants: GI   Culture/Microbiology    Component Value Date/Time   SDES URINE, RANDOM 01/06/2021 1149   [place]  01/06/2021 1149    NONE Performed at [facility], [place] [address] [place], [place] [address]    CULT >=100,000 COLONIES/mL ESCHERICHIA COLI (A) 01/06/2021 1149   REPTSTATUS 01/08/2021 FINAL 01/06/2021 1149    Other culture-see note  Medications: Scheduled Meds: . amoxicillin-clavulanate  1 tablet Oral Q12H  . aspirin EC  81 mg Oral Daily  . atorvastatin  80 mg Oral Daily  . cycloSPORINE  1 drop Both Eyes BID  . fluticasone furoate-vilanterol  1 puff Inhalation Daily   And  . umeclidinium bromide  1 puff Inhalation Daily  . heparin  5,000 Units Subcutaneous Q8H  . mouth  rinse  15 mL Mouth Rinse BID  . pantoprazole  40 mg Oral Daily  . saccharomyces boulardii  250 mg Oral BID  . sodium chloride flush  3 mL Intravenous Q12H   Continuous Infusions:   Antimicrobials: Anti-infectives (From admission, onward)   Start     Dose/Rate Route Frequency Ordered Stop   01/09/21 1000  amoxicillin-clavulanate (AUGMENTIN) 875-125 MG per tablet 1 tablet        1 tablet Oral Every 12 hours 01/09/21 0724     01/08/21 1800  cephALEXin (KEFLEX) capsule 500 mg  Status:  Discontinued         500 mg Oral Every 12 hours 01/08/21 1513 01/09/21 0724   01/06/21 1800  cefTRIAXone (ROCEPHIN) 2 g in sodium chloride 0.9 % 100 mL IVPB  Status:  Discontinued        2 g 200 mL/hr over 30 Minutes Intravenous Every 24 hours 01/06/21 1702 01/08/21 1513     Objective: Vitals: Today's Vitals   01/09/21 1223 01/09/21 1224 01/09/21 1225 01/09/21 1232  BP:    (!) 110/48  Pulse: 76 76 75 80  Resp: 18 20 20 17   Temp:    98.7 F (37.1 C)  TempSrc:    Oral  SpO2: 93% 94% 94% 96%  PainSc:        Intake/Output Summary (Last 24 hours) at 01/09/2021 1417 Last data filed at 01/09/2021 0802 Gross per 24 hour  Intake 513 ml  Output -  Net 513 ml   There were no vitals filed for this visit. Weight change:   Intake/Output from previous day: 02/03 0701 - 02/04 0700 In: 363 [P.O.:360; I.V.:3] Out: -  Intake/Output this shift: Total I/O In: 270 [P.O.:270] Out: -  There were no vitals filed for this visit.  Examination:  General: Appearance:     Frail female in no acute distress     Lungs:     respirations unlabored, few crackles at bases  Heart:    Normal heart rate. Normal rhythm. No murmurs, rubs, or gallops.   MS:   All extremities are intact.   Neurologic:   Awake, alert, pleasant and cooperative     Data Reviewed: I have personally reviewed following labs and imaging studies CBC: Recent Labs  Lab 01/06/21 0011 01/07/21 0339 01/08/21 0132 01/09/21 0124  WBC 14.3* 12.6* 8.7 12.0*  HGB 13.2 13.3 12.0 11.5*  HCT 38.8 39.8 35.5* 34.9*  MCV 87.4 88.1 88.8 88.4  PLT 212 243 218 [id number]   Basic Metabolic Panel: Recent Labs  Lab 01/06/21 0011 01/06/21 1043 01/07/21 0220 01/08/21 0132 01/09/21 0124  NA 130*  --  134* 133* 135  K 3.6  --  3.9 3.7 3.3*  CL 95*  --  98 101 103  CO2 21*  --  23 21* 20*  GLUCOSE 151*  --  102* 80 86  BUN 29*  --  28* 23 20  CREATININE 3.29*  --  2.81* 2.13* 1.65*  CALCIUM 9.5  --  9.1 8.5* 8.6*  MG  --  1.6*  --   --  1.6*   GFR: CrCl  cannot be calculated (Unknown ideal weight.). Liver Function Tests: Recent Labs  Lab 01/06/21 0011  AST 36  ALT 22  ALKPHOS 67  BILITOT 1.0  PROT 5.8*  ALBUMIN 2.9*   Recent Labs  Lab 01/06/21 0011  LIPASE 43   No results for input(s): AMMONIA in the last 168 hours. Coagulation Profile: No results for input(s):  INR, PROTIME in the last 168 hours. Cardiac Enzymes: Recent Labs  Lab 01/07/21 0220  CKTOTAL 68   BNP (last 3 results) No results for input(s): PROBNP in the last 8760 hours. HbA1C: No results for input(s): HGBA1C in the last 72 hours. CBG: Recent Labs  Lab 01/08/21 0812 01/08/21 1206 01/08/21 2138 01/09/21 0936 01/09/21 1116  GLUCAP 79 85 107* 141* 139*   Lipid Profile: No results for input(s): CHOL, HDL, LDLCALC, TRIG, CHOLHDL, LDLDIRECT in the last 72 hours. Thyroid Function Tests: No results for input(s): TSH, T4TOTAL, FREET4, T3FREE, THYROIDAB in the last 72 hours. Anemia Panel: Recent Labs    01/09/21 0124  VITAMINB12 1,262*   Sepsis Labs: No results for input(s): PROCALCITON, LATICACIDVEN in the last 168 hours.  Recent Results (from the past 240 hour(s))  SARS Coronavirus 2 by RT PCR (hospital order, performed in [facility] hospital lab) Nasopharyngeal Nasopharyngeal Swab     Status: None   Collection Time: 01/06/21 10:42 AM   Specimen: Nasopharyngeal Swab  Result Value Ref Range Status   SARS Coronavirus 2 NEGATIVE NEGATIVE Final    Comment: (NOTE) SARS-CoV-2 target nucleic acids are NOT DETECTED.  The SARS-CoV-2 RNA is generally detectable in upper and lower respiratory specimens during the acute phase of infection. The lowest concentration of SARS-CoV-2 viral copies this assay can detect is 250 copies / mL. A negative result does not preclude SARS-CoV-2 infection and should not be used as the sole basis for treatment or other patient management decisions.  A negative result may occur with improper specimen collection / handling,  submission of specimen other than nasopharyngeal swab, presence of viral mutation(s) within the areas targeted by this assay, and inadequate number of viral copies (<250 copies / mL). A negative result must be combined with clinical observations, patient history, and epidemiological information.  Fact Sheet for Patients:   StrictlyIdeas.no  Fact Sheet for Healthcare Providers: BankingDealers.co.za  This test is not yet approved or  cleared by the [place] FDA and has been authorized for detection and/or diagnosis of SARS-CoV-2 by FDA under an Emergency Use Authorization (EUA).  This EUA will remain in effect (meaning this test can be used) for the duration of the COVID-19 declaration under Section 564(b)(1) of the Act, 21 U.S.C. section 360bbb-3(b)(1), unless the authorization is terminated or revoked sooner.  Performed at [facility], [place] [address] [place], [place] [address]   Culture, Urine     Status: Abnormal   Collection Time: 01/06/21 11:49 AM   Specimen: Urine, Random  Result Value Ref Range Status   Specimen Description URINE, RANDOM  Final   Special Requests   Final    NONE Performed at [facility], [place] [address] [place], [place] [address]    Culture >=100,000 COLONIES/mL ESCHERICHIA COLI (A)  Final   Report Status 01/08/2021 FINAL  Final   Organism ID, Bacteria ESCHERICHIA COLI (A)  Final      Susceptibility   Escherichia coli - MIC*    AMPICILLIN 4 SENSITIVE Sensitive     CEFAZOLIN <=4 SENSITIVE Sensitive     CEFEPIME <=0.12 SENSITIVE Sensitive     CEFTRIAXONE <=0.25 SENSITIVE Sensitive     CIPROFLOXACIN <=0.25 SENSITIVE Sensitive     GENTAMICIN <=1 SENSITIVE Sensitive     IMIPENEM <=0.25 SENSITIVE Sensitive     NITROFURANTOIN <=16 SENSITIVE Sensitive     TRIMETH/SULFA <=20 SENSITIVE Sensitive     AMPICILLIN/SULBACTAM <=2 SENSITIVE Sensitive     PIP/TAZO <=4  SENSITIVE Sensitive     *  >=100,000 COLONIES/mL ESCHERICHIA COLI  Gastrointestinal Panel by PCR , Stool     Status: None   Collection Time: 01/06/21  5:14 PM   Specimen: Stool  Result Value Ref Range Status   Campylobacter species NOT DETECTED NOT DETECTED Final   Plesimonas shigelloides NOT DETECTED NOT DETECTED Final   Salmonella species NOT DETECTED NOT DETECTED Final   Yersinia enterocolitica NOT DETECTED NOT DETECTED Final   Vibrio species NOT DETECTED NOT DETECTED Final   Vibrio cholerae NOT DETECTED NOT DETECTED Final   Enteroaggregative E coli (EAEC) NOT DETECTED NOT DETECTED Final   Enteropathogenic E coli (EPEC) NOT DETECTED NOT DETECTED Final   Enterotoxigenic E coli (ETEC) NOT DETECTED NOT DETECTED Final   Shiga like toxin producing E coli (STEC) NOT DETECTED NOT DETECTED Final   Shigella/Enteroinvasive E coli (EIEC) NOT DETECTED NOT DETECTED Final   Cryptosporidium NOT DETECTED NOT DETECTED Final   Cyclospora cayetanensis NOT DETECTED NOT DETECTED Final   Entamoeba histolytica NOT DETECTED NOT DETECTED Final   Giardia lamblia NOT DETECTED NOT DETECTED Final   Adenovirus F40/41 NOT DETECTED NOT DETECTED Final   Astrovirus NOT DETECTED NOT DETECTED Final   Norovirus GI/GII NOT DETECTED NOT DETECTED Final   Rotavirus A NOT DETECTED NOT DETECTED Final   Sapovirus (I, II, IV, and V) NOT DETECTED NOT DETECTED Final    Comment: Performed at [facility], [place] [place], [place] [address]  C Difficile Quick Screen w PCR reflex     Status: None   Collection Time: 01/06/21  5:14 PM   Specimen: Stool  Result Value Ref Range Status   C Diff antigen NEGATIVE NEGATIVE Final   C Diff toxin NEGATIVE NEGATIVE Final   C Diff interpretation No C. difficile detected.  Final    Comment: Performed at [facility], [place] [address] [place], [place] [address]     Radiology Studies: CT ABDOMEN PELVIS WO CONTRAST  Result Date: 01/08/2021 CLINICAL DATA:  Abdominal pain and diarrhea EXAM: CT  ABDOMEN AND PELVIS WITHOUT CONTRAST TECHNIQUE: Multidetector CT imaging of the abdomen and pelvis was performed following the standard protocol without IV contrast. COMPARISON:  Chest x-ray from earlier in the same day. FINDINGS: Lower chest: Mild emphysematous changes are noted. Mild bibasilar atelectatic changes are seen. Hepatobiliary: Gallbladder is well distended without evidence of cholelithiasis. No inflammatory changes are seen. The liver is unremarkable. Pancreas: Pancreas is atrophic but otherwise within normal limits. Spleen: Scattered calcified granulomas are noted in the spleen. Adrenals/Urinary Tract: Adrenal glands are within normal limits. Nonobstructing left renal calculi are noted measuring 1-2 mm. No ureteral stones are seen. The bladder is partially distended. Stomach/Bowel: Diffuse diverticulosis of the sigmoid colon is noted. Wall thickening is noted consistent with smooth muscle hypertrophy. Minimal pericolonic inflammatory changes are seen without abscess or perforation. No obstructive changes are noted. The appendix is within normal limits. No inflammatory changes are seen. The small bowel and stomach are unremarkable. Vascular/Lymphatic: Atherosclerotic calcifications of the aorta are noted. Stent graft is noted in place. Aneurysm sac again measures 4 cm but stable. No significant lymphadenopathy is noted. Reproductive: Status post hysterectomy. No adnexal masses. Other: No abdominal wall hernia or abnormality. No abdominopelvic ascites. Musculoskeletal: Postoperative and degenerative changes of lumbar spine are noted. IMPRESSION: Mild bibasilar atelectatic changes. Changes of mild diverticulitis in the sigmoid colon. No abscess or perforation is noted. Of prior aortic stent graft therapy. Aneurysm sac is stable at  4 cm. Electronically Signed   By: Inez Catalina M.D.   On: 01/08/2021 19:40   MR BRAIN WO CONTRAST  Result Date: 01/07/2021 CLINICAL DATA:  Encephalopathy EXAM: MRI HEAD  WITHOUT CONTRAST TECHNIQUE: Multiplanar, multiecho pulse sequences of the brain and surrounding structures were obtained without intravenous contrast. COMPARISON:  None. FINDINGS: Brain: No acute infarct, mass effect or extra-axial collection. Focus of chronic microhemorrhage in the left frontal lobe. No acute hemorrhage. There is multifocal hyperintense T2-weighted signal within the white matter. Parenchymal volume and CSF spaces are normal. Incidentally noted pericallosal lipoma, unchanged. Vascular: Major flow voids are preserved. Skull and upper cervical spine: Normal calvarium and skull base. Visualized upper cervical spine and soft tissues are normal. Sinuses/Orbits:No paranasal sinus fluid levels or advanced mucosal thickening. No mastoid or middle ear effusion. Normal orbits. IMPRESSION: 1. No acute intracranial abnormality. 2. 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 Electronically Signed   By: Ulyses Jarred M.D.   On: 01/07/2021 22:22   DG CHEST PORT 1 VIEW  Result Date: 01/08/2021 CLINICAL DATA:  Hypoxia. EXAM: PORTABLE CHEST 1 VIEW COMPARISON:  Chest x-ray 07/27/2021 FINDINGS: The cardiac silhouette, mediastinal and hilar contours are within normal limits and stable given the AP projection and portable technique. 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 Low lung volumes with vascular crowding and streaky atelectasis. No infiltrates or effusions. No pulmonary edema. Mild to moderate eventration of the right hemidiaphragm. The bony thorax is intact. IMPRESSION: Low lung volumes with vascular crowding and streaky atelectasis. Electronically Signed   By: Marijo Sanes M.D.   On: 01/08/2021 15:47   DG Chest Port 1 View  Result Date: 01/07/2021 CLINICAL DATA:  Hiccups and hypoxia EXAM: PORTABLE CHEST 1 VIEW COMPARISON:  06/16/2020 FINDINGS: Patient rotated right. Prior median sternotomy. Midline trachea. Mild cardiomegaly. Mitral  annular calcifications. Atherosclerosis in the transverse aorta. No pleural effusion or pneumothorax. Basilar predominant interstitial thickening is similar and may relate to chronic bronchitis or prior smoking. No lobar consolidation. IMPRESSION: No acute cardiopulmonary disease. Aortic Atherosclerosis (ICD10-I70.0). Electronically Signed   By: Abigail Miyamoto M.D.   On: 01/07/2021 15:55   ECHOCARDIOGRAM COMPLETE  Result Date: 01/09/2021    ECHOCARDIOGRAM REPORT   Patient Name:   SHEMEKA WYNN [facility] Date of Exam: 01/09/2021 Medical Rec #:  [id number]           Height:       60.0 in Accession #:    [id number]          Weight:       135.0 lb Date of Birth:  1946-03-12           BSA:          1.579 m Patient Age:    17 years            BP:           114/42 mmHg Patient Gender: F                   HR:           74 bpm. Exam Location:  Inpatient Procedure: 2D Echo, Color Doppler and Cardiac Doppler Indications:    R01.1 Murmur  History:        Patient has prior history of Echocardiogram examinations, most                 recent 11/16/2014. Prior CABG, COPD; Risk Factors:Hypertension,  Diabetes and Dyslipidemia.  Sonographer:    Raquel Sarna Senior RDCS Referring Phys: [place]  1. Left ventricular ejection fraction, by estimation, is 60 to 65%. The left ventricle has normal function. The left ventricle has no regional wall motion abnormalities. There is mild asymmetric left ventricular hypertrophy of the basal-septal segment. Left ventricular diastolic parameters are consistent with Grade I diastolic dysfunction (impaired relaxation).  2. Right ventricular systolic function is normal. The right ventricular size is normal. There is normal pulmonary artery systolic pressure.  3. Left atrial size was severely dilated.  4. The mitral valve is normal in structure. Mild mitral valve regurgitation. No evidence of mitral stenosis. The mean mitral valve gradient is 4.0 mmHg. Moderate mitral annular  calcification.  5. Tricuspid valve regurgitation is moderate.  6. The aortic valve is normal in structure. Aortic valve regurgitation is not visualized. No aortic stenosis is present.  7. The inferior vena cava is normal in size with greater than 50% respiratory variability, suggesting right atrial pressure of 3 mmHg. FINDINGS  Left Ventricle: Left ventricular ejection fraction, by estimation, is 60 to 65%. The left ventricle has normal function. The left ventricle has no regional wall motion abnormalities. The left ventricular internal cavity size was normal in size. There is  mild asymmetric left ventricular hypertrophy of the basal-septal segment. Left ventricular diastolic parameters are consistent with Grade I diastolic dysfunction (impaired relaxation). Right Ventricle: The right ventricular size is normal. No increase in right ventricular wall thickness. Right ventricular systolic function is normal. There is normal pulmonary artery systolic pressure. The tricuspid regurgitant velocity is 2.84 m/s, and  with an assumed right atrial pressure of 3 mmHg, the estimated right ventricular systolic pressure is [id number] mmHg. Left Atrium: Left atrial size was severely dilated. Right Atrium: Right atrial size was normal in size. Pericardium: There is no evidence of pericardial effusion. Mitral Valve: The mitral valve is normal in structure. There is mild thickening of the mitral valve leaflet(s). Moderate mitral annular calcification. Mild mitral valve regurgitation. No evidence of mitral valve stenosis. MV peak gradient, 7.8 mmHg. The mean mitral valve gradient is 4.0 mmHg. Tricuspid Valve: The tricuspid valve is normal in structure. Tricuspid valve regurgitation is moderate . No evidence of tricuspid stenosis. Aortic Valve: The aortic valve is normal in structure. Aortic valve regurgitation is not visualized. No aortic stenosis is present. Pulmonic Valve: The pulmonic valve was normal in structure. Pulmonic valve  regurgitation is mild. No evidence of pulmonic stenosis. Aorta: The aortic root is normal in size and structure. Venous: The inferior vena cava is normal in size with greater than 50% respiratory variability, suggesting right atrial pressure of 3 mmHg. IAS/Shunts: No atrial level shunt detected by color flow Doppler.  LEFT VENTRICLE PLAX 2D LVIDd:         4.00 cm  Diastology LVIDs:         2.50 cm  LV e' medial:    6.74 cm/s LV PW:         1.10 cm  LV E/e' medial:  13.9 LV IVS:        1.30 cm  LV e' lateral:   9.90 cm/s LVOT diam:     1.90 cm  LV E/e' lateral: 9.5 LV SV:         90 LV SV Index:   57 LVOT Area:     2.84 cm  RIGHT VENTRICLE RV S prime:     8.70 cm/s TAPSE (M-mode): 1.9 cm LEFT  ATRIUM             Index       RIGHT ATRIUM           Index LA diam:        4.60 cm 2.91 cm/m  RA Area:     15.10 cm LA Vol (A2C):   89.4 ml 56.60 ml/m RA Volume:   37.80 ml  23.93 ml/m LA Vol (A4C):   75.2 ml 47.61 ml/m LA Biplane Vol: 86.0 ml 54.45 ml/m  AORTIC VALVE LVOT Vmax:   142.00 cm/s LVOT Vmean:  93.100 cm/s LVOT VTI:    0.317 m  AORTA Ao Root diam: 2.70 cm Ao Asc diam:  3.10 cm MITRAL VALVE                TRICUSPID VALVE MV Area (PHT): 2.66 cm     TR Peak grad:   32.3 mmHg MV Area VTI:   2.25 cm     TR Vmax:        284.00 cm/s MV Peak grad:  7.8 mmHg MV Mean grad:  4.0 mmHg     SHUNTS MV Vmax:       1.40 m/s     Systemic VTI:  0.32 m MV Vmean:      98.3 cm/s    Systemic Diam: 1.90 cm MV Decel Time: 285 msec MV E velocity: 93.80 cm/s MV A velocity: 123.00 cm/s MV E/A ratio:  0.76 Candee Furbish MD Electronically signed by Candee Furbish MD Signature Date/Time: 01/09/2021/12:31:31 PM    Final      LOS: 3 days   Geradine Girt, DO [facility]  01/09/2021, 2:17 PM

## 2021-01-09 NOTE — Progress Notes (Signed)
Echocardiogram 2D Echocardiogram has been performed.  Oneal Deputy Zyasia Halbleib 01/09/2021, 8:40 AM

## 2021-01-09 NOTE — Care Management Important Message (Signed)
Important Message  Patient Details  Name: Megan [name] MRN: [id number] Date of Birth: 05-08-1946   Medicare Important Message Given:  Yes     Dorena Bodo 01/09/2021, 1:27 PM

## 2021-01-10 ENCOUNTER — Inpatient Hospital Stay (HOSPITAL_COMMUNITY): Payer: Medicare Other

## 2021-01-10 ENCOUNTER — Other Ambulatory Visit: Payer: Self-pay | Admitting: Cardiology

## 2021-01-10 LAB — CBC
HCT: 34.9 % — ABNORMAL LOW (ref 36.0–46.0)
Hemoglobin: 11.2 g/dL — ABNORMAL LOW (ref 12.0–15.0)
MCH: 28.3 pg (ref 26.0–34.0)
MCHC: 32.1 g/dL (ref 30.0–36.0)
MCV: 88.1 fL (ref 80.0–100.0)
Platelets: 280 10*3/uL (ref 150–400)
RBC: 3.96 MIL/uL (ref 3.87–5.11)
RDW: 13 % (ref 11.5–15.5)
WBC: 13.1 10*3/uL — ABNORMAL HIGH (ref 4.0–10.5)
nRBC: 0 % (ref 0.0–0.2)

## 2021-01-10 LAB — RESPIRATORY PANEL BY PCR

## 2021-01-10 LAB — GLUCOSE, CAPILLARY
Glucose-Capillary: 130 mg/dL — ABNORMAL HIGH (ref 70–99)
Glucose-Capillary: 116 mg/dL — ABNORMAL HIGH (ref 70–99)
Glucose-Capillary: 116 mg/dL — ABNORMAL HIGH (ref 70–99)

## 2021-01-10 LAB — BASIC METABOLIC PANEL
Anion gap: 8 (ref 5–15)
BUN: 19 mg/dL (ref 8–23)
CO2: 25 mmol/L (ref 22–32)
Calcium: 8.5 mg/dL — ABNORMAL LOW (ref 8.9–10.3)
Chloride: 102 mmol/L (ref 98–111)
Creatinine, Ser: 1.28 mg/dL — ABNORMAL HIGH (ref 0.44–1.00)
GFR, Estimated: 43 mL/min — ABNORMAL LOW (ref >60)
Glucose, Bld: 133 mg/dL — ABNORMAL HIGH (ref 70–99)
Potassium: 3.8 mmol/L (ref 3.5–5.1)
Sodium: 135 mmol/L (ref 135–145)

## 2021-01-10 LAB — PREALBUMIN: Prealbumin: <5 mg/dL — ABNORMAL LOW (ref 18–38)

## 2021-01-10 MED ORDER — NYSTATIN 100000 UNIT/ML MT SUSP
5.0000 mL | Freq: Four times a day (QID) | OROMUCOSAL | Status: DC
  Administered 2021-01-10 – 2021-01-11 (×3): 500000 [IU] via ORAL
  Filled 2021-01-10 (×3): qty 5

## 2021-01-10 MED ORDER — FUROSEMIDE 10 MG/ML IJ SOLN
40.0000 mg | Freq: Once | INTRAMUSCULAR | Status: AC
  Administered 2021-01-11: 40 mg via INTRAVENOUS
  Filled 2021-01-10: qty 4

## 2021-01-10 MED ORDER — PIPERACILLIN-TAZOBACTAM 3.375 G IVPB
3.3750 g | Freq: Three times a day (TID) | INTRAVENOUS | Status: DC
  Administered 2021-01-10 – 2021-01-13 (×9): 3.375 g via INTRAVENOUS
  Filled 2021-01-10 (×11): qty 50

## 2021-01-10 MED ORDER — LOPERAMIDE HCL 2 MG PO CAPS: 4.0000 mg | ORAL_CAPSULE | Freq: Once | ORAL | Status: DC

## 2021-01-10 NOTE — Plan of Care (Signed)

## 2021-01-10 NOTE — Evaluation (Signed)
Physical Therapy Evaluation Patient Details Name: Megan [name] MRN: [id number] DOB: 11/07/1946 Today's Date: 01/10/2021   History of Present Illness  75 year old female with HTN, HLD, CAD/PAD, COPD, T2DM, history of back surgery and mildly issues with balance since surgeryn and walks with cane at baseline, admitted 01/06/21 with diarrhea-nonbloody and generalized weakness x4 days prior to admission.Found to have UTI but her diarrhea has continued so work up in progress.  Patient also had episode of fluid overload/acute respiratory failure.  Clinical Impression  Pt admitted with above diagnosis. Pt ambulated to bathroom and was able to clean herself after having diarrhea. Pt was able to ambulate with rollator in hallway with min guard assist with good stability overall. Pt will use cane at home. Will bring cane next visit.  Pt should progress well.  Pt currently with functional limitations due to balance and endurance deficits. Pt will benefit from skilled PT to increase their independence and safety with mobility to allow discharge to the venue listed below.      Follow Up Recommendations Home health PT;Supervision - Intermittent    Equipment Recommendations  None recommended by PT    Recommendations for Other Services       Precautions / Restrictions Precautions Precautions: Fall Restrictions Weight Bearing Restrictions: No      Mobility  Bed Mobility               General bed mobility comments: In chair on arrival and asking to walk to the bathroom due to diarrhea.    Transfers Overall transfer level: Needs assistance Equipment used: Rolling walker (2 wheeled);None;1 person hand held assist Transfers: Sit to/from Stand Sit to Stand: Min guard         General transfer comment: Steadying assist needed without assist  Ambulation/Gait Ambulation/Gait assistance: Min guard Gait Distance (Feet): 250 Feet Assistive device: 4-wheeled walker;1 person hand held  assist Gait Pattern/deviations: Step-through pattern;Decreased stride length;Trunk flexed;Wide base of support;Drifts right/left   Gait velocity interpretation: <1.31 ft/sec, indicative of household ambulator General Gait Details: Pt walked without device and needing min guard assist with pt holding onto furniture, PT and walls. With device, min guard assist. Pt used rollator in hallway and is safe with the rollator. Pt states there is no room to use a rollator at home as well as she cant get it in and out of car as her husband cant help her at home. Should progress and be able to use cane safely.  Stairs            Wheelchair Mobility    Modified Rankin (Stroke Patients Only)       Balance Overall balance assessment: Needs assistance Sitting-balance support: No upper extremity supported;Feet supported Sitting balance-Leahy Scale: Fair     Standing balance support: Bilateral upper extremity supported;During functional activity;No upper extremity supported Standing balance-Leahy Scale: Fair Standing balance comment: can stand statically without UE support, however needs at least 1 UE support dynamically                             Pertinent Vitals/Pain Pain Assessment: No/denies pain    Home Living Family/patient expects to be discharged to:: Private residence Living Arrangements: Spouse/significant other Available Help at Discharge: Family;Available 24 hours/day (husband has neurological disorder, dementia and UE and LE issues; paid caregivers 24 hours day) Type of Home: House Home Access: Stairs to enter Entrance Stairs-Rails: Right Entrance Stairs-Number of Steps: 5 Home Layout: Multi-level Home  Equipment: Adaptive equipment;Cane - single point;Bedside commode;Toilet riser;Shower seat;Grab bars - toilet;Grab bars - tub/shower;Hand held shower head Additional Comments: pt lives in [place] home but only needs to go up to the top where the master bedroom is  located. Pt has the 15 stairs to climb with a short landing in the middle.. 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 has caregivers     Hand Dominance   Dominant Hand: Right    Extremity/Trunk Assessment   Upper Extremity Assessment Upper Extremity Assessment: Defer to OT evaluation    Lower Extremity Assessment Lower Extremity Assessment: Generalized weakness    Cervical / Trunk Assessment Cervical / Trunk Assessment: Normal  Communication   Communication: HOH (with hearing aids)  Cognition Arousal/Alertness: Awake/alert Behavior During Therapy: WFL for tasks assessed/performed Overall Cognitive Status: Within Functional Limits for tasks assessed                                        General Comments General comments (skin integrity, edema, etc.): VSS with pt desat occasionally on RA but never below 89% with activity. Replaced 1LO2 on return to room as pt had it on on arrival.    Exercises General Exercises - Lower Extremity Ankle Circles/Pumps: AROM;5 reps;Both;Supine Long Arc Quad: AROM;Both;5 reps;Seated   Assessment/Plan    PT Assessment Patient needs continued PT services  PT Problem List Decreased balance;Decreased activity tolerance;Decreased mobility;Decreased knowledge of use of DME;Decreased safety awareness;Decreased knowledge of precautions;Cardiopulmonary status limiting activity       PT Treatment Interventions DME instruction;Gait training;Functional mobility training;Therapeutic activities;Therapeutic exercise;Balance training;Patient/family education;Stair training    PT Goals (Current goals can be found in the Care Plan section)  Acute Rehab PT Goals Patient Stated Goal: to gohome PT Goal Formulation: With patient Time For Goal Achievement: 01/24/21 Potential to Achieve Goals:  Good    Frequency Min 3X/week   Barriers to discharge  (pt and husband have 24 hour caregivers)      Co-evaluation               AM-PAC PT "6 Clicks" Mobility  Outcome Measure Help needed turning from your back to your side while in a flat bed without using bedrails?: A Little Help needed moving from lying on your back to sitting on the side of a flat bed without using bedrails?: A Little Help needed moving to and from a bed to a chair (including a wheelchair)?: A Little Help needed standing up from a chair using your arms (e.g., wheelchair or bedside chair)?: A Little Help needed to walk in hospital room?: A Little Help needed climbing 3-5 steps with a railing? : A Little 6 Click Score: 18    End of Session Equipment Utilized During Treatment: Gait belt;Oxygen Activity Tolerance: Patient limited by fatigue Patient left: in chair;with call bell/phone within reach;with chair alarm set;with family/visitor present Nurse Communication: Mobility status PT Visit Diagnosis: Muscle weakness (generalized) (M62.81)    Time: 2130-8657 PT Time Calculation (min) (ACUTE ONLY): 32 min   Charges:   PT Evaluation $PT Eval Moderate Complexity: 1 Mod PT Treatments $Gait Training: 8-22 mins        Jerold Yoss W,PT Acute Rehabilitation Services Pager:  [phone]  Office:  [phone]  Godfrey Pick Sondi Desch 01/10/2021, 3:05 PM

## 2021-01-10 NOTE — Progress Notes (Addendum)
PROGRESS NOTE    Megan [name]  [id number] DOB: [birth year] DOA: 01/05/2021 PCP: [name], MD   Chief Complaint  Patient presents with  . Diarrhea  . Nausea    Brief Narrative: 75 year old female with HTN, HLD, CAD/PAD, COPD, T2DM, history of back surgery and mildly issues with balance since surgeryn and walks with cane at baseline, admitted 01/06/21 with diarrhea-nonbloody and generalized weakness x4 days prior to admission.   Found to have UTI but her diarrhea has continued so work up in progress.  Patient also had episode of fluid overload/acute respiratory failure.   Subjective: Patient denies visual hallucinations. Has had watery stools x4 today but per nursing report, she only had smears of stool no watery diarrhea. She denies abdominal pain. Has increase in urinary frequency. Remains on O2 supplementation though this is being weaned off to 1-2lpm via [place]. Has occasion dry cough.  Had a low grade temp overnight with Tmax of 100.55F.   Assessment & Plan:  ARF:  -Likely pre-renal/volume depletion due to diarrhea along with home meds HCTZ/losartan and torsemide -daily labs  -stop IVF -improved with IV lasix 2/5 Improving, Scr trending down.   Diarrhea: Negative for C. difficile, GI panel negative. -on lactobacillus -check CT scan- ? Diverticulitis -on abx -GI consult -poor PO intake continues -check pre-albumin 2/5 Per nursing report, patient now with smears, no liquid stools, GI panel was negative 4 days ago, C diff is negative.   Acute metabolic encephalopathy/memory issues recurrent:  -MRI brain negative -? Delirium as it seems to wax and wane 2/5 Mentation appears to be improving during the day, no longer having hallucinations but still some confusion.  E coli UTI POA:  -on augmentin 2/5 No tolerating Augmentin well and WBC trending up. Starting Zosyn for now.   Acute hypoxic respiratory failure  -SPO2 dropped in mid 80s needing 1 L nasal cannula   -add incentive spirometry and MOBILIZE patient -BNP elevated- responded well to IV lasix-- dose daily -echo: EF [id number], type 1 diastolic 2/5 CXR showing left pleural effusion and bl atelectasis. Lasix IV in am. Start IS use. Ordered respi viral panel, blood cultures.   Hyponatremia, mild at 134 from prerenal dehydration. 2/5 Resolved.   T2DM:Last hemoglobin 7.9 stable June 13, 2020, also well controlled 90.  Would allow regular diet.  HOLD MEDS  GERD: Continue PPI  Essential hypertension/history of renal artery stenosis.  Blood pressure is controlled.  Continue amlodipine and  Hydralazine for now.  Holding diuretics. 2/5 BP stable, ordering Lasix for the morning.   CAD/PAD history of CABG 1995 on a statin and aspirin.  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  Had a renal ultrasound in June 2019  HLD on Lipitor.   Oral thrush Start nystatin  DVT prophylaxis: heparin injection 5,000 Units Start: 01/06/21 1400 Code Status:   Code Status: Full Code  Family Communication: plan of care discussed with patient   Status is: Inpatient Remains inpatient appropriate because:Inpatient level of care appropriate due to severity of illness  MPOA: Graylin Shiver, updated on 2/4.  Dispo: The patient is from: Home              Anticipated d/c is to: Home              Anticipated d/c date is: 2 days              Patient  currently is not medically stable to d/c. GI sign off, PT eval recs with HH--may benefit from [facility] RN as well.    Difficult to place patient No  Consultants: GI   Culture/Microbiology    Component Value Date/Time   SDES BLOOD LEFT ANTECUBITAL 01/10/2021 1127   SDES BLOOD RIGHT ANTECUBITAL 01/10/2021 1127   SPECREQUEST  01/10/2021 1127    BOTTLES DRAWN AEROBIC ONLY Blood Culture adequate volume   SPECREQUEST  01/10/2021 1127    BOTTLES DRAWN AEROBIC  ONLY Blood Culture results may not be optimal due to an inadequate volume of blood received in culture bottles   CULT  01/10/2021 1127    NO GROWTH < 12 HOURS Performed at [facility], [place] [address], [place], [place] [address]    CULT  01/10/2021 1127    NO GROWTH < 12 HOURS Performed at [facility], [place] [address] [place], [place] [address]    REPTSTATUS PENDING 01/10/2021 1127   REPTSTATUS PENDING 01/10/2021 1127    Other culture-see note  Medications: Scheduled Meds: . aspirin EC  81 mg Oral Daily  . atorvastatin  80 mg Oral Daily  . cycloSPORINE  1 drop Both Eyes BID  . fluticasone furoate-vilanterol  1 puff Inhalation Daily   And  . umeclidinium bromide  1 puff Inhalation Daily  . heparin  5,000 Units Subcutaneous Q8H  . loperamide  4 mg Oral Once  . mouth rinse  15 mL Mouth Rinse BID  . nystatin  5 mL Oral QID  . pantoprazole  40 mg Oral Daily  . saccharomyces boulardii  250 mg Oral BID  . sodium chloride flush  3 mL Intravenous Q12H   Continuous Infusions: . piperacillin-tazobactam (ZOSYN)  IV 3.375 g (01/10/21 1621)    Antimicrobials: Anti-infectives (From admission, onward)   Start     Dose/Rate Route Frequency Ordered Stop   01/10/21 1400  piperacillin-tazobactam (ZOSYN) IVPB 3.375 g        3.375 g 12.5 mL/hr over 240 Minutes Intravenous Every 8 hours 01/10/21 1117     01/09/21 1000  amoxicillin-clavulanate (AUGMENTIN) 875-125 MG per tablet 1 tablet  Status:  Discontinued        1 tablet Oral Every 12 hours 01/09/21 0724 01/10/21 1117   01/08/21 1800  cephALEXin (KEFLEX) capsule 500 mg  Status:  Discontinued        500 mg Oral Every 12 hours 01/08/21 1513 01/09/21 0724   01/06/21 1800  cefTRIAXone (ROCEPHIN) 2 g in sodium chloride 0.9 % 100 mL IVPB  Status:  Discontinued        2 g 200 mL/hr over 30 Minutes Intravenous Every 24 hours 01/06/21 1702 01/08/21 1513     Objective: Vitals: Today's Vitals   01/10/21 0914 01/10/21 1130 01/10/21  1618 01/10/21 1722  BP:  (!) 127/43 (!) 155/58   Pulse: 78 79 91   Resp: 18  19   Temp:  97.7 F (36.5 C) 99 F (37.2 C)   TempSrc:  Oral Oral   SpO2: 93% 91% 96%   PainSc:  2  4  0-No pain    Intake/Output Summary (Last 24 hours) at 01/10/2021 1758 Last data filed at 01/10/2021 1718 Gross per 24 hour  Intake 613.58 ml  Output --  Net 613.58 ml   There were no vitals filed for this visit. Weight change:   Intake/Output from previous day: 02/04 0701 - 02/05 0700 In: 633 [P.O.:630; I.V.:3] Out: -  Intake/Output this  shift: Total I/O In: 370.6 [P.O.:360; IV Piggyback:10.6] Out: -  There were no vitals filed for this visit.  Examination:  General: Appearance:     Frail female in no acute distress     Lungs:    occasional cough, few crackles at bases  Heart:    Normal heart rate. Normal rhythm.    MS:   All extremities are intact.   Neurologic:   Awake, alert, pleasant and cooperative     Data Reviewed: I have personally reviewed following labs and imaging studies CBC: Recent Labs  Lab 01/06/21 0011 01/07/21 0339 01/08/21 0132 01/09/21 0124 01/10/21 0056  WBC 14.3* 12.6* 8.7 12.0* 13.1*  HGB 13.2 13.3 12.0 11.5* 11.2*  HCT 38.8 39.8 35.5* 34.9* 34.9*  MCV 87.4 88.1 88.8 88.4 88.1  PLT 212 243 218 235 712   Basic Metabolic Panel: Recent Labs  Lab 01/06/21 0011 01/06/21 1043 01/07/21 0220 01/08/21 0132 01/09/21 0124 01/10/21 0056  NA 130*  --  134* 133* 135 135  K 3.6  --  3.9 3.7 3.3* 3.8  CL 95*  --  98 101 103 102  CO2 21*  --  23 21* 20* 25  GLUCOSE 151*  --  102* 80 86 133*  BUN 29*  --  28* 23 20 19   CREATININE 3.29*  --  2.81* 2.13* 1.65* 1.28*  CALCIUM 9.5  --  9.1 8.5* 8.6* 8.5*  MG  --  1.6*  --   --  1.6*  --    GFR: CrCl cannot be calculated (Unknown ideal weight.). Liver Function Tests: Recent Labs  Lab 01/06/21 0011  AST 36  ALT 22  ALKPHOS 67  BILITOT 1.0  PROT 5.8*  ALBUMIN 2.9*   Recent Labs  Lab 01/06/21 0011  LIPASE  43   No results for input(s): AMMONIA in the last 168 hours. Coagulation Profile: No results for input(s): INR, PROTIME in the last 168 hours. Cardiac Enzymes: Recent Labs  Lab 01/07/21 0220  CKTOTAL 68   BNP (last 3 results) No results for input(s): PROBNP in the last 8760 hours. HbA1C: No results for input(s): HGBA1C in the last 72 hours. CBG: Recent Labs  Lab 01/09/21 1116 01/09/21 1645 01/09/21 2226 01/10/21 1127 01/10/21 1621  GLUCAP 139* 110* 137* 130* 116*   Lipid Profile: No results for input(s): CHOL, HDL, LDLCALC, TRIG, CHOLHDL, LDLDIRECT in the last 72 hours. Thyroid Function Tests: No results for input(s): TSH, T4TOTAL, FREET4, T3FREE, THYROIDAB in the last 72 hours. Anemia Panel: Recent Labs    01/09/21 0124  VITAMINB12 1,262*   Sepsis Labs: No results for input(s): PROCALCITON, LATICACIDVEN in the last 168 hours.  Recent Results (from the past 240 hour(s))  SARS Coronavirus 2 by RT PCR (hospital order, performed in [facility] hospital lab) Nasopharyngeal Nasopharyngeal Swab     Status: None   Collection Time: 01/06/21 10:42 AM   Specimen: Nasopharyngeal Swab  Result Value Ref Range Status   SARS Coronavirus 2 NEGATIVE NEGATIVE Final    Comment: (NOTE) SARS-CoV-2 target nucleic acids are NOT DETECTED.  The SARS-CoV-2 RNA is generally detectable in upper and lower respiratory specimens during the acute phase of infection. The lowest concentration of SARS-CoV-2 viral copies this assay can detect is 250 copies / mL. A negative result does not preclude SARS-CoV-2 infection and should not be used as the sole basis for treatment or other patient management decisions.  A negative result may occur with improper specimen collection / handling, submission of  specimen other than nasopharyngeal swab, presence of viral mutation(s) within the areas targeted by this assay, and inadequate number of viral copies (<250 copies / mL). A negative result must be  combined with clinical observations, patient history, and epidemiological information.  Fact Sheet for Patients:   StrictlyIdeas.no  Fact Sheet for Healthcare Providers: BankingDealers.co.za  This test is not yet approved or  cleared by the [place] FDA and has been authorized for detection and/or diagnosis of SARS-CoV-2 by FDA under an Emergency Use Authorization (EUA).  This EUA will remain in effect (meaning this test can be used) for the duration of the COVID-19 declaration under Section 564(b)(1) of the Act, 21 U.S.C. section 360bbb-3(b)(1), unless the authorization is terminated or revoked sooner.  Performed at [facility], [place] [address] [place], [place] [address]   Culture, Urine     Status: Abnormal   Collection Time: 01/06/21 11:49 AM   Specimen: Urine, Random  Result Value Ref Range Status   Specimen Description URINE, RANDOM  Final   Special Requests   Final    NONE Performed at [facility], [place] [address] [place], [place] [address]    Culture >=100,000 COLONIES/mL ESCHERICHIA COLI (A)  Final   Report Status 01/08/2021 FINAL  Final   Organism ID, Bacteria ESCHERICHIA COLI (A)  Final      Susceptibility   Escherichia coli - MIC*    AMPICILLIN 4 SENSITIVE Sensitive     CEFAZOLIN <=4 SENSITIVE Sensitive     CEFEPIME <=0.12 SENSITIVE Sensitive     CEFTRIAXONE <=0.25 SENSITIVE Sensitive     CIPROFLOXACIN <=0.25 SENSITIVE Sensitive     GENTAMICIN <=1 SENSITIVE Sensitive     IMIPENEM <=0.25 SENSITIVE Sensitive     NITROFURANTOIN <=16 SENSITIVE Sensitive     TRIMETH/SULFA <=20 SENSITIVE Sensitive     AMPICILLIN/SULBACTAM <=2 SENSITIVE Sensitive     PIP/TAZO <=4 SENSITIVE Sensitive     * >=100,000 COLONIES/mL ESCHERICHIA COLI  Gastrointestinal Panel by PCR , Stool     Status: None   Collection Time: 01/06/21  5:14 PM   Specimen: Stool  Result Value Ref Range Status   Campylobacter species NOT  DETECTED NOT DETECTED Final   Plesimonas shigelloides NOT DETECTED NOT DETECTED Final   Salmonella species NOT DETECTED NOT DETECTED Final   Yersinia enterocolitica NOT DETECTED NOT DETECTED Final   Vibrio species NOT DETECTED NOT DETECTED Final   Vibrio cholerae NOT DETECTED NOT DETECTED Final   Enteroaggregative E coli (EAEC) NOT DETECTED NOT DETECTED Final   Enteropathogenic E coli (EPEC) NOT DETECTED NOT DETECTED Final   Enterotoxigenic E coli (ETEC) NOT DETECTED NOT DETECTED Final   Shiga like toxin producing E coli (STEC) NOT DETECTED NOT DETECTED Final   Shigella/Enteroinvasive E coli (EIEC) NOT DETECTED NOT DETECTED Final   Cryptosporidium NOT DETECTED NOT DETECTED Final   Cyclospora cayetanensis NOT DETECTED NOT DETECTED Final   Entamoeba histolytica NOT DETECTED NOT DETECTED Final   Giardia lamblia NOT DETECTED NOT DETECTED Final   Adenovirus F40/41 NOT DETECTED NOT DETECTED Final   Astrovirus NOT DETECTED NOT DETECTED Final   Norovirus GI/GII NOT DETECTED NOT DETECTED Final   Rotavirus A NOT DETECTED NOT DETECTED Final   Sapovirus (I, II, IV, and V) NOT DETECTED NOT DETECTED Final    Comment: Performed at [facility], [address], [place], [place] [address]  C Difficile Quick Screen w PCR reflex     Status: None   Collection Time: 01/06/21  5:14 PM   Specimen: Stool  Result Value Ref Range Status   C Diff antigen NEGATIVE NEGATIVE Final   C Diff toxin NEGATIVE NEGATIVE Final   C Diff interpretation No C. difficile detected.  Final    Comment: Performed at [facility], [place] [address], [place], [place] [address]  Culture, blood (routine x 2)     Status: None (Preliminary result)   Collection Time: 01/10/21 11:27 AM   Specimen: BLOOD  Result Value Ref Range Status   Specimen Description BLOOD LEFT ANTECUBITAL  Final   Special Requests   Final    BOTTLES DRAWN AEROBIC ONLY Blood Culture adequate volume   Culture   Final    NO GROWTH < 12  HOURS Performed at [facility], [place] [address] [place], [place] [address]    Report Status PENDING  Incomplete  Culture, blood (routine x 2)     Status: None (Preliminary result)   Collection Time: 01/10/21 11:27 AM   Specimen: BLOOD  Result Value Ref Range Status   Specimen Description BLOOD RIGHT ANTECUBITAL  Final   Special Requests   Final    BOTTLES DRAWN AEROBIC ONLY Blood Culture results may not be optimal due to an inadequate volume of blood received in culture bottles   Culture   Final    NO GROWTH < 12 HOURS Performed at [facility], [place] [address], [place], [place] [address]    Report Status PENDING  Incomplete     Radiology Studies: CT ABDOMEN PELVIS WO CONTRAST  Result Date: 01/08/2021 CLINICAL DATA:  Abdominal pain and diarrhea EXAM: CT ABDOMEN AND PELVIS WITHOUT CONTRAST TECHNIQUE: Multidetector CT imaging of the abdomen and pelvis was performed following the standard protocol without IV contrast. COMPARISON:  Chest x-ray from earlier in the same day. FINDINGS: Lower chest: Mild emphysematous changes are noted. Mild bibasilar atelectatic changes are seen. Hepatobiliary: Gallbladder is well distended without evidence of cholelithiasis. No inflammatory changes are seen. The liver is unremarkable. Pancreas: Pancreas is atrophic but otherwise within normal limits. Spleen: Scattered calcified granulomas are noted in the spleen. Adrenals/Urinary Tract: Adrenal glands are within normal limits. Nonobstructing left renal calculi are noted measuring 1-2 mm. No ureteral stones are seen. The bladder is partially distended. Stomach/Bowel: Diffuse diverticulosis of the sigmoid colon is noted. Wall thickening is noted consistent with smooth muscle hypertrophy. Minimal pericolonic inflammatory changes are seen without abscess or perforation. No obstructive changes are noted. The appendix is within normal limits. No inflammatory changes are seen. The small bowel and stomach are  unremarkable. Vascular/Lymphatic: Atherosclerotic calcifications of the aorta are noted. Stent graft is noted in place. Aneurysm sac again measures 4 cm but stable. No significant lymphadenopathy is noted. Reproductive: Status post hysterectomy. No adnexal masses. Other: No abdominal wall hernia or abnormality. No abdominopelvic ascites. Musculoskeletal: Postoperative and degenerative changes of lumbar spine are noted. IMPRESSION: Mild bibasilar atelectatic changes. Changes of mild diverticulitis in the sigmoid colon. No abscess or perforation is noted. Of prior aortic stent graft therapy. 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 Electronically Signed   By: Inez Catalina M.D.   On: 01/08/2021 19:40   DG CHEST PORT 1 VIEW  Result Date: 01/10/2021 CLINICAL DATA:  Shortness of breath EXAM: PORTABLE CHEST 1 VIEW COMPARISON:  January 08, 2021 FINDINGS: The cardiomediastinal silhouette is unchanged in contour.Atherosclerotic calcifications of the aorta. Status post median sternotomy. Small LEFT pleural effusion. No pneumothorax. Bibasilar linear opacities, likely atelectasis. Visualized abdomen is  unremarkable. Multilevel degenerative changes of the thoracic spine. IMPRESSION: Small LEFT pleural effusion and bibasilar atelectasis. Electronically Signed   By: Valentino Saxon MD   On: 01/10/2021 13:46   ECHOCARDIOGRAM COMPLETE  Result Date: 01/09/2021    ECHOCARDIOGRAM REPORT   Patient Name:   LYNORE DAGHER [facility] Date of Exam: 01/09/2021 Medical Rec #:  [id number]           Height:       60.0 in Accession #:    [id number]          Weight:       135.0 lb Date of Birth:  12-02-1946           BSA:          1.579 m Patient Age:    89 years            BP:           114/42 mmHg Patient Gender: F                   HR:           74 bpm. Exam Location:  Inpatient Procedure: 2D Echo, Color Doppler and Cardiac Doppler Indications:    R01.1 Murmur  History:        Patient has prior history of Echocardiogram examinations, most                  recent 11/16/2014. Prior CABG, COPD; Risk Factors:Hypertension,                 Diabetes and Dyslipidemia.  Sonographer:    Raquel Sarna Senior RDCS Referring Phys: [place]  1. Left ventricular ejection fraction, by estimation, is 60 to 65%. The left ventricle has normal function. The left ventricle has no regional wall motion abnormalities. There is mild asymmetric left ventricular hypertrophy of the basal-septal segment. Left ventricular diastolic parameters are consistent with Grade I diastolic dysfunction (impaired relaxation).  2. Right ventricular systolic function is normal. The right ventricular size is normal. There is normal pulmonary artery systolic pressure.  3. Left atrial size was severely dilated.  4. The mitral valve is normal in structure. Mild mitral valve regurgitation. No evidence of mitral stenosis. The mean mitral valve gradient is 4.0 mmHg. Moderate mitral annular calcification.  5. Tricuspid valve regurgitation is moderate.  6. The aortic valve is normal in structure. Aortic valve regurgitation is not visualized. No aortic stenosis is present.  7. The inferior vena cava is normal in size with greater than 50% respiratory variability, suggesting right atrial pressure of 3 mmHg. FINDINGS  Left Ventricle: Left ventricular ejection fraction, by estimation, is 60 to 65%. The left ventricle has normal function. The left ventricle has no regional wall motion abnormalities. The left ventricular internal cavity size was normal in size. There is  mild asymmetric left ventricular hypertrophy of the basal-septal segment. Left ventricular diastolic parameters are consistent with Grade I diastolic dysfunction (impaired relaxation). Right Ventricle: The right ventricular size is normal. No increase in right ventricular wall thickness. Right ventricular systolic function is normal. There is normal pulmonary artery systolic pressure. The tricuspid regurgitant velocity is 2.84 m/s, and  with  an assumed right atrial pressure of 3 mmHg, the estimated right ventricular systolic pressure is [id number] mmHg. Left Atrium: Left atrial size was severely dilated. Right Atrium: Right atrial size was normal in size. Pericardium: There is no evidence of pericardial effusion. Mitral Valve: The mitral valve is  normal in structure. There is mild thickening of the mitral valve leaflet(s). Moderate mitral annular calcification. Mild mitral valve regurgitation. No evidence of mitral valve stenosis. MV peak gradient, 7.8 mmHg. The mean mitral valve gradient is 4.0 mmHg. Tricuspid Valve: The tricuspid valve is normal in structure. Tricuspid valve regurgitation is moderate . No evidence of tricuspid stenosis. Aortic Valve: The aortic valve is normal in structure. Aortic valve regurgitation is not visualized. No aortic stenosis is present. Pulmonic Valve: The pulmonic valve was normal in structure. Pulmonic valve regurgitation is mild. No evidence of pulmonic stenosis. Aorta: The aortic root is normal in size and structure. Venous: The inferior vena cava is normal in size with greater than 50% respiratory variability, suggesting right atrial pressure of 3 mmHg. IAS/Shunts: No atrial level shunt detected by color flow Doppler.  LEFT VENTRICLE PLAX 2D LVIDd:         4.00 cm  Diastology LVIDs:         2.50 cm  LV e' medial:    6.74 cm/s LV PW:         1.10 cm  LV E/e' medial:  13.9 LV IVS:        1.30 cm  LV e' lateral:   9.90 cm/s LVOT diam:     1.90 cm  LV E/e' lateral: 9.5 LV SV:         90 LV SV Index:   57 LVOT Area:     2.84 cm  RIGHT VENTRICLE RV S prime:     8.70 cm/s TAPSE (M-mode): 1.9 cm LEFT ATRIUM             Index       RIGHT ATRIUM           Index LA diam:        4.60 cm 2.91 cm/m  RA Area:     15.10 cm LA Vol (A2C):   89.4 ml 56.60 ml/m RA Volume:   37.80 ml  23.93 ml/m LA Vol (A4C):   75.2 ml 47.61 ml/m LA Biplane Vol: 86.0 ml 54.45 ml/m  AORTIC VALVE LVOT Vmax:   142.00 cm/s LVOT Vmean:  93.100 cm/s LVOT VTI:     0.317 m  AORTA Ao Root diam: 2.70 cm Ao Asc diam:  3.10 cm MITRAL VALVE                TRICUSPID VALVE MV Area (PHT): 2.66 cm     TR Peak grad:   32.3 mmHg MV Area VTI:   2.25 cm     TR Vmax:        284.00 cm/s MV Peak grad:  7.8 mmHg MV Mean grad:  4.0 mmHg     SHUNTS MV Vmax:       1.40 m/s     Systemic VTI:  0.32 m MV Vmean:      98.3 cm/s    Systemic Diam: 1.90 cm MV Decel Time: 285 msec MV E velocity: 93.80 cm/s MV A velocity: 123.00 cm/s MV E/A ratio:  0.76 Candee Furbish MD Electronically signed by Candee Furbish MD Signature Date/Time: 01/09/2021/12:31:31 PM    Final      LOS: 4 days   Blain Pais, MD [facility]  01/10/2021, 5:58 PM

## 2021-01-10 NOTE — Plan of Care (Signed)
  Problem: Education: Goal: Knowledge of General Education information will improve Description: Including pain rating scale, medication(s)/side effects and non-pharmacologic comfort measures 01/10/2021 2118 by Roslyn Smiling, RN Outcome: Progressing 01/10/2021 2100 by Roslyn Smiling, RN Outcome: Progressing   Problem: Health Behavior/Discharge Planning: Goal: Ability to manage health-related needs will improve 01/10/2021 2118 by Fortune Torosian, Kristopher Glee, RN Outcome: Progressing 01/10/2021 2100 by Roslyn Smiling, RN Outcome: Progressing   Problem: Clinical Measurements: Goal: Ability to maintain clinical measurements within normal limits will improve 01/10/2021 2118 by Tatyana Biber, Kristopher Glee, RN Outcome: Progressing 01/10/2021 2100 by Roslyn Smiling, RN Outcome: Progressing Goal: Will remain free from infection 01/10/2021 2118 by Roslyn Smiling, RN Outcome: Progressing 01/10/2021 2100 by Roslyn Smiling, RN Outcome: Progressing Goal: Diagnostic test results will improve 01/10/2021 2118 by Quintyn Dombek, Kristopher Glee, RN Outcome: Progressing 01/10/2021 2100 by Roslyn Smiling, RN Outcome: Progressing Goal: Respiratory complications will improve Outcome: Progressing Goal: Cardiovascular complication will be avoided Outcome: Progressing   Problem: Activity: Goal: Risk for activity intolerance will decrease Outcome: Progressing   Problem: Nutrition: Goal: Adequate nutrition will be maintained Outcome: Progressing   Problem: Coping: Goal: Level of anxiety will decrease Outcome: Progressing   Problem: Elimination: Goal: Will not experience complications related to bowel motility Outcome: Progressing Goal: Will not experience complications related to urinary retention Outcome: Progressing   Problem: Pain Managment: Goal: General experience of comfort will improve Outcome: Progressing   Problem: Safety: Goal: Ability to remain free from injury will improve Outcome:  Progressing   Problem: Skin Integrity: Goal: Risk for impaired skin integrity will decrease Outcome: Progressing

## 2021-01-11 LAB — BASIC METABOLIC PANEL
Anion gap: 10 (ref 5–15)
BUN: 12 mg/dL (ref 8–23)
CO2: 23 mmol/L (ref 22–32)
Calcium: 8.7 mg/dL — ABNORMAL LOW (ref 8.9–10.3)
Chloride: 104 mmol/L (ref 98–111)
Creatinine, Ser: 0.93 mg/dL (ref 0.44–1.00)
GFR, Estimated: >60 mL/min (ref >60)
Glucose, Bld: 110 mg/dL — ABNORMAL HIGH (ref 70–99)
Potassium: 3.3 mmol/L — ABNORMAL LOW (ref 3.5–5.1)
Sodium: 137 mmol/L (ref 135–145)

## 2021-01-11 LAB — CBC WITH DIFFERENTIAL/PLATELET
Abs Immature Granulocytes: 0.11 10*3/uL — ABNORMAL HIGH (ref 0.00–0.07)
Basophils Absolute: 0.1 10*3/uL (ref 0.0–0.1)
Basophils Relative: 1 %
Eosinophils Absolute: 0.2 10*3/uL (ref 0.0–0.5)
Eosinophils Relative: 1 %
HCT: 33.3 % — ABNORMAL LOW (ref 36.0–46.0)
Hemoglobin: 11.5 g/dL — ABNORMAL LOW (ref 12.0–15.0)
Immature Granulocytes: 1 %
Lymphocytes Relative: 6 %
Lymphs Abs: 0.7 10*3/uL (ref 0.7–4.0)
MCH: 29.7 pg (ref 26.0–34.0)
MCHC: 34.5 g/dL (ref 30.0–36.0)
MCV: 86 fL (ref 80.0–100.0)
Monocytes Absolute: 1 10*3/uL (ref 0.1–1.0)
Monocytes Relative: 9 %
Neutro Abs: 9.8 10*3/uL — ABNORMAL HIGH (ref 1.7–7.7)
Neutrophils Relative %: 82 %
Platelets: 265 10*3/uL (ref 150–400)
RBC: 3.87 MIL/uL (ref 3.87–5.11)
RDW: 13.1 % (ref 11.5–15.5)
WBC: 11.9 10*3/uL — ABNORMAL HIGH (ref 4.0–10.5)
nRBC: 0 % (ref 0.0–0.2)

## 2021-01-11 LAB — GLUCOSE, CAPILLARY
Glucose-Capillary: 155 mg/dL — ABNORMAL HIGH (ref 70–99)
Glucose-Capillary: 150 mg/dL — ABNORMAL HIGH (ref 70–99)
Glucose-Capillary: 123 mg/dL — ABNORMAL HIGH (ref 70–99)

## 2021-01-11 LAB — MAGNESIUM: Magnesium: 1.6 mg/dL — ABNORMAL LOW (ref 1.7–2.4)

## 2021-01-11 MED ORDER — POTASSIUM CHLORIDE 20 MEQ PO PACK
40.0000 meq | PACK | Freq: Once | ORAL | Status: AC
  Administered 2021-01-11: 40 meq via ORAL
  Filled 2021-01-11: qty 2

## 2021-01-11 MED ORDER — MAGNESIUM SULFATE 2 GM/50ML IV SOLN
2.0000 g | Freq: Once | INTRAVENOUS | Status: AC
  Administered 2021-01-11: 2 g via INTRAVENOUS
  Filled 2021-01-11: qty 50

## 2021-01-11 MED ORDER — SIMETHICONE 80 MG PO CHEW
80.0000 mg | CHEWABLE_TABLET | Freq: Four times a day (QID) | ORAL | Status: DC | PRN
  Administered 2021-01-11 – 2021-01-12 (×5): 80 mg via ORAL
  Filled 2021-01-11 (×5): qty 1

## 2021-01-11 MED ORDER — MAGIC MOUTHWASH
5.0000 mL | Freq: Three times a day (TID) | ORAL | Status: DC
  Administered 2021-01-11 – 2021-01-13 (×7): 5 mL via ORAL
  Filled 2021-01-11 (×9): qty 5

## 2021-01-11 NOTE — Progress Notes (Addendum)
PROGRESS NOTE    Megan [name]  [id number] DOB: December 06, 1946 DOA: 01/05/2021 PCP: [name], MD   Chief Complaint  Patient presents with  . Diarrhea  . Nausea    Brief Narrative: 75 year old female with HTN, HLD, CAD/PAD, COPD, T2DM, history of back surgery and mildly issues with balance since surgeryn and walks with cane at baseline, admitted 01/06/21 with diarrhea-nonbloody and generalized weakness x4 days prior to admission.   Found to have UTI but her diarrhea has continued so work up in progress.  Patient also had episode of fluid overload/acute respiratory failure.   Subjective:  Afebrile. No diarrhea. Tolerating po well but has oral thrush, prefers magic mouthwash which has been ordered. Renal function continues to improve. Remains on 1lpm via [place] but will attempt to be weaned off to RA this pm.   Assessment & Plan:  ARF:  -Likely pre-renal/volume depletion due to diarrhea along with home meds HCTZ/losartan and torsemide -daily labs  -stop IVF -improved with IV lasix 2/6 Improving, Scr trending down. Received IV Lasix again this am.   Diarrhea: Negative for C. difficile, GI panel negative. -on lactobacillus -check CT scan- ? Diverticulitis -on abx -GI consult -poor PO intake continues -check pre-albumin 2/5 Per nursing report, patient now with smears, no liquid stools, GI panel was negative 4 days ago, C diff is negative.  2/6 Diarrhea has resolved, patient reports loose stool with augmentin which was discontinued on 2/5.   Acute metabolic encephalopathy/memory issues recurrent:  -MRI brain negative -? Delirium as it seems to wax and wane 2/5 Mentation appears to be improving during the day, no longer having hallucinations but still some confusion. 2/6 Not as confused now but will need outpatient evaluation for cognitive/memory decline.   E coli UTI POA:  -on augmentin 2/5 Not tolerating Augmentin well and WBC trending up. Starting Zosyn for now.  2/6  Continue Zosyn, may transition to po once WBC normal and remaining afebrile but does not want to start augmentin again.    Acute on chronic diastolic HF with preserved EF 2/6 Improving with IV Lasix, had another dose today. Continue monitoring, may need another dose of Lasix tomorrow.   Acute hypoxic respiratory failure  -SPO2 dropped in mid 80s needing 1 L nasal cannula  -add incentive spirometry and MOBILIZE patient -BNP elevated- responded well to IV lasix-- dose daily -echo: EF [id number], type 1 diastolic 2/5 CXR showing left pleural effusion and bl atelectasis. Lasix IV in am. Start IS use. Ordered respi viral panel, blood cultures.  2/6 Respiratory viral panel negative. Likely due to acute on chronic diastolic HF. May need Lasix IV in am but currently being weaned off O2 supplementation.   Hypokalemia 2/6 Mild, providing repletion today, repeat labs in am.   Hyponatremia, mild at 134 from prerenal dehydration. 2/5 Resolved.   T2DM:Last hemoglobin 7.9 stable June 13, 2020, also well controlled 90.  Would allow regular diet.  HOLD MEDS  GERD: Continue PPI  Essential hypertension/history of renal artery stenosis.  Blood pressure is controlled.  Continue amlodipine and  Hydralazine for now.  Holding diuretics. 2/5 BP stable, ordering Lasix for the morning.   CAD/PAD history of CABG 1995 on a statin and aspirin.  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  Had a renal ultrasound in June 2019  HLD on Lipitor.   Oral thrush Start nystatin 2/6 Magic mouthwash   DVT  prophylaxis: heparin injection 5,000 Units Start: 01/06/21 1400 Code Status:   Code Status: Full Code  Family Communication: plan of care discussed with patient   Status is: Inpatient Remains inpatient appropriate because:Inpatient level of care appropriate due to severity of illness  MPOA: Graylin Shiver, updated on 2/4.  Dispo: The patient is from: Home              Anticipated d/c is to: Home              Anticipated d/c date is: 2 days              Patient currently is not medically stable to d/c. GI sign off, PT eval recs with HH--may benefit from [facility] RN as well.    Difficult to place patient No  Consultants: GI   Culture/Microbiology    Component Value Date/Time   SDES BLOOD LEFT ANTECUBITAL 01/10/2021 1127   SDES BLOOD RIGHT ANTECUBITAL 01/10/2021 1127   SPECREQUEST  01/10/2021 1127    BOTTLES DRAWN AEROBIC ONLY Blood Culture adequate volume   SPECREQUEST  01/10/2021 1127    BOTTLES DRAWN AEROBIC ONLY Blood Culture results may not be optimal due to an inadequate volume of blood received in culture bottles   CULT  01/10/2021 1127    NO GROWTH < 24 HOURS Performed at [facility], [place] [address], [place], [place] [address]    CULT  01/10/2021 1127    NO GROWTH < 24 HOURS Performed at [facility], [place] [address] [place], [place] [address]    REPTSTATUS PENDING 01/10/2021 1127   REPTSTATUS PENDING 01/10/2021 1127    Other culture-see note  Medications: Scheduled Meds: . aspirin EC  81 mg Oral Daily  . atorvastatin  80 mg Oral Daily  . cycloSPORINE  1 drop Both Eyes BID  . fluticasone furoate-vilanterol  1 puff Inhalation Daily   And  . umeclidinium bromide  1 puff Inhalation Daily  . heparin  5,000 Units Subcutaneous Q8H  . magic mouthwash  5 mL Oral TID  . mouth rinse  15 mL Mouth Rinse BID  . pantoprazole  40 mg Oral Daily  . saccharomyces boulardii  250 mg Oral BID  . sodium chloride flush  3 mL Intravenous Q12H   Continuous Infusions: . piperacillin-tazobactam (ZOSYN)  IV 3.375 g (01/11/21 1407)    Antimicrobials: Anti-infectives (From admission, onward)   Start     Dose/Rate Route Frequency Ordered Stop   01/10/21 1400  piperacillin-tazobactam (ZOSYN) IVPB 3.375 g        3.375 g 12.5 mL/hr over 240 Minutes Intravenous Every 8 hours  01/10/21 1117     01/09/21 1000  amoxicillin-clavulanate (AUGMENTIN) 875-125 MG per tablet 1 tablet  Status:  Discontinued        1 tablet Oral Every 12 hours 01/09/21 0724 01/10/21 1117   01/08/21 1800  cephALEXin (KEFLEX) capsule 500 mg  Status:  Discontinued        500 mg Oral Every 12 hours 01/08/21 1513 01/09/21 0724   01/06/21 1800  cefTRIAXone (ROCEPHIN) 2 g in sodium chloride 0.9 % 100 mL IVPB  Status:  Discontinued        2 g 200 mL/hr over 30 Minutes Intravenous Every 24 hours 01/06/21 1702 01/08/21 1513     Objective: Vitals: Today's Vitals   01/11/21 0756 01/11/21 0827 01/11/21 1100 01/11/21 1536  BP: (!) 158/59  (!) 124/40 132/62  Pulse: 79  74 77  Resp:  18  20 15   Temp:   98.2 F (36.8 C) 97.8 F (36.6 C)  TempSrc:   Oral Oral  SpO2: 94% 96%    PainSc:        Intake/Output Summary (Last 24 hours) at 01/11/2021 1651 Last data filed at 01/10/2021 2133 Gross per 24 hour  Intake 250.58 ml  Output 600 ml  Net -349.42 ml   There were no vitals filed for this visit. Weight change:   Intake/Output from previous day: 02/05 0701 - 02/06 0700 In: 370.6 [P.O.:360; IV Piggyback:10.6] Out: 600 [Urine:200; Stool:400] Intake/Output this shift: No intake/output data recorded. There were no vitals filed for this visit.  Examination:  General: Appearance:     Frail female in no acute distress     Lungs:    No cough, few crackles at bases  Heart:    Normal heart rate. Normal rhythm.    MS:   All extremities are intact.   Neurologic:   Awake, alert, pleasant and cooperative     Data Reviewed: I have personally reviewed following labs and imaging studies CBC: Recent Labs  Lab 01/07/21 0339 01/08/21 0132 01/09/21 0124 01/10/21 0056 01/11/21 0156  WBC 12.6* 8.7 12.0* 13.1* 11.9*  NEUTROABS  --   --   --   --  9.8*  HGB 13.3 12.0 11.5* 11.2* 11.5*  HCT 39.8 35.5* 34.9* 34.9* 33.3*  MCV 88.1 88.8 88.4 88.1 86.0  PLT 243 218 235 280 [id number]   Basic Metabolic  Panel: Recent Labs  Lab 01/06/21 1043 01/07/21 0220 01/08/21 0132 01/09/21 0124 01/10/21 0056 01/11/21 0156  NA  --  134* 133* 135 135 137  K  --  3.9 3.7 3.3* 3.8 3.3*  CL  --  98 101 103 102 104  CO2  --  23 21* 20* 25 23  GLUCOSE  --  102* 80 86 133* 110*  BUN  --  28* 23 20 19 12   CREATININE  --  2.81* 2.13* 1.65* 1.28* 0.93  CALCIUM  --  9.1 8.5* 8.6* 8.5* 8.7*  MG 1.6*  --   --  1.6*  --  1.6*   GFR: CrCl cannot be calculated (Unknown ideal weight.). Liver Function Tests: Recent Labs  Lab 01/06/21 0011  AST 36  ALT 22  ALKPHOS 67  BILITOT 1.0  PROT 5.8*  ALBUMIN 2.9*   Recent Labs  Lab 01/06/21 0011  LIPASE 43   No results for input(s): AMMONIA in the last 168 hours. Coagulation Profile: No results for input(s): INR, PROTIME in the last 168 hours. Cardiac Enzymes: Recent Labs  Lab 01/07/21 0220  CKTOTAL 68   BNP (last 3 results) No results for input(s): PROBNP in the last 8760 hours. HbA1C: No results for input(s): HGBA1C in the last 72 hours. CBG: Recent Labs  Lab 01/10/21 1127 01/10/21 1621 01/10/21 2130 01/11/21 0606 01/11/21 1541  GLUCAP 130* 116* 116* 155* 150*   Lipid Profile: No results for input(s): CHOL, HDL, LDLCALC, TRIG, CHOLHDL, LDLDIRECT in the last 72 hours. Thyroid Function Tests: No results for input(s): TSH, T4TOTAL, FREET4, T3FREE, THYROIDAB in the last 72 hours. Anemia Panel: Recent Labs    01/09/21 0124  VITAMINB12 1,262*   Sepsis Labs: No results for input(s): PROCALCITON, LATICACIDVEN in the last 168 hours.  Recent Results (from the past 240 hour(s))  SARS Coronavirus 2 by RT PCR (hospital order, performed in [facility] hospital lab) Nasopharyngeal Nasopharyngeal Swab     Status: None   Collection  Time: 01/06/21 10:42 AM   Specimen: Nasopharyngeal Swab  Result Value Ref Range Status   SARS Coronavirus 2 NEGATIVE NEGATIVE Final    Comment: (NOTE) SARS-CoV-2 target nucleic acids are NOT DETECTED.  The  SARS-CoV-2 RNA is generally detectable in upper and lower respiratory specimens during the acute phase of infection. The lowest concentration of SARS-CoV-2 viral copies this assay can detect is 250 copies / mL. A negative result does not preclude SARS-CoV-2 infection and should not be used as the sole basis for treatment or other patient management decisions.  A negative result may occur with improper specimen collection / handling, submission of specimen other than nasopharyngeal swab, presence of viral mutation(s) within the areas targeted by this assay, and inadequate number of viral copies (<250 copies / mL). A negative result must be combined with clinical observations, patient history, and epidemiological information.  Fact Sheet for Patients:   BoilerBrush.com.cy  Fact Sheet for Healthcare Providers: https://pope.com/  This test is not yet approved or  cleared by the [place] FDA and has been authorized for detection and/or diagnosis of SARS-CoV-2 by FDA under an Emergency Use Authorization (EUA).  This EUA will remain in effect (meaning this test can be used) for the duration of the COVID-19 declaration under Section 564(b)(1) of the Act, 21 U.S.C. section 360bbb-3(b)(1), unless the authorization is terminated or revoked sooner.  Performed at [facility], [address] [place], [place] [address]   Culture, Urine     Status: Abnormal   Collection Time: 01/06/21 11:49 AM   Specimen: Urine, Random  Result Value Ref Range Status   Specimen Description URINE, RANDOM  Final   Special Requests   Final    NONE Performed at [facility], [address], [place], [place] [address]    Culture >=100,000 COLONIES/mL ESCHERICHIA COLI (A)  Final   Report Status 01/08/2021 FINAL  Final   Organism ID, Bacteria ESCHERICHIA COLI (A)  Final      Susceptibility   Escherichia coli - MIC*    AMPICILLIN 4 SENSITIVE  Sensitive     CEFAZOLIN <=4 SENSITIVE Sensitive     CEFEPIME <=0.12 SENSITIVE Sensitive     CEFTRIAXONE <=0.25 SENSITIVE Sensitive     CIPROFLOXACIN <=0.25 SENSITIVE Sensitive     GENTAMICIN <=1 SENSITIVE Sensitive     IMIPENEM <=0.25 SENSITIVE Sensitive     NITROFURANTOIN <=16 SENSITIVE Sensitive     TRIMETH/SULFA <=20 SENSITIVE Sensitive     AMPICILLIN/SULBACTAM <=2 SENSITIVE Sensitive     PIP/TAZO <=4 SENSITIVE Sensitive     * >=100,000 COLONIES/mL ESCHERICHIA COLI  Gastrointestinal Panel by PCR , Stool     Status: None   Collection Time: 01/06/21  5:14 PM   Specimen: Stool  Result Value Ref Range Status   Campylobacter species NOT DETECTED NOT DETECTED Final   Plesimonas shigelloides NOT DETECTED NOT DETECTED Final   Salmonella species NOT DETECTED NOT DETECTED Final   Yersinia enterocolitica NOT DETECTED NOT DETECTED Final   Vibrio species NOT DETECTED NOT DETECTED Final   Vibrio cholerae NOT DETECTED NOT DETECTED Final   Enteroaggregative E coli (EAEC) NOT DETECTED NOT DETECTED Final   Enteropathogenic E coli (EPEC) NOT DETECTED NOT DETECTED Final   Enterotoxigenic E coli (ETEC) NOT DETECTED NOT DETECTED Final   Shiga like toxin producing E coli (STEC) NOT DETECTED NOT DETECTED Final   Shigella/Enteroinvasive E coli (EIEC) NOT DETECTED NOT DETECTED Final   Cryptosporidium NOT DETECTED NOT DETECTED Final  Cyclospora cayetanensis NOT DETECTED NOT DETECTED Final   Entamoeba histolytica NOT DETECTED NOT DETECTED Final   Giardia lamblia NOT DETECTED NOT DETECTED Final   Adenovirus F40/41 NOT DETECTED NOT DETECTED Final   Astrovirus NOT DETECTED NOT DETECTED Final   Norovirus GI/GII NOT DETECTED NOT DETECTED Final   Rotavirus A NOT DETECTED NOT DETECTED Final   Sapovirus (I, II, IV, and V) NOT DETECTED NOT DETECTED Final    Comment: Performed at [facility], [place] [place], [place] [address]  C Difficile Quick Screen w PCR reflex     Status: None    Collection Time: 01/06/21  5:14 PM   Specimen: Stool  Result Value Ref Range Status   C Diff antigen NEGATIVE NEGATIVE Final   C Diff toxin NEGATIVE NEGATIVE Final   C Diff interpretation No C. difficile detected.  Final    Comment: Performed at [facility], [place] [address], [place], [place] [address]  Culture, blood (routine x 2)     Status: None (Preliminary result)   Collection Time: 01/10/21 11:27 AM   Specimen: BLOOD  Result Value Ref Range Status   Specimen Description BLOOD LEFT ANTECUBITAL  Final   Special Requests   Final    BOTTLES DRAWN AEROBIC ONLY Blood Culture adequate volume   Culture   Final    NO GROWTH < 24 HOURS Performed at [facility], [place] [address], [place], [place] [address]    Report Status PENDING  Incomplete  Culture, blood (routine x 2)     Status: None (Preliminary result)   Collection Time: 01/10/21 11:27 AM   Specimen: BLOOD  Result Value Ref Range Status   Specimen Description BLOOD RIGHT ANTECUBITAL  Final   Special Requests   Final    BOTTLES DRAWN AEROBIC ONLY Blood Culture results may not be optimal due to an inadequate volume of blood received in culture bottles   Culture   Final    NO GROWTH < 24 HOURS Performed at [facility], [place] [address], [place], [place] [address]    Report Status PENDING  Incomplete  Respiratory (~20 pathogens) panel by PCR     Status: None   Collection Time: 01/10/21  9:56 PM   Specimen: Nasopharyngeal Swab; Respiratory  Result Value Ref Range Status   Adenovirus NOT DETECTED NOT DETECTED Final   Coronavirus 229E NOT DETECTED NOT DETECTED Final    Comment: (NOTE) The Coronavirus on the Respiratory Panel, DOES NOT test for the novel  Coronavirus (2019 nCoV)    Coronavirus HKU1 NOT DETECTED NOT DETECTED Final   Coronavirus NL63 NOT DETECTED NOT DETECTED Final   Coronavirus OC43 NOT DETECTED NOT DETECTED Final   Metapneumovirus NOT DETECTED NOT DETECTED Final   Rhinovirus / Enterovirus NOT  DETECTED NOT DETECTED Final   Influenza A NOT DETECTED NOT DETECTED Final   Influenza B NOT DETECTED NOT DETECTED Final   Parainfluenza Virus 1 NOT DETECTED NOT DETECTED Final   Parainfluenza Virus 2 NOT DETECTED NOT DETECTED Final   Parainfluenza Virus 3 NOT DETECTED NOT DETECTED Final   Parainfluenza Virus 4 NOT DETECTED NOT DETECTED Final   Respiratory Syncytial Virus NOT DETECTED NOT DETECTED Final   Bordetella pertussis NOT DETECTED NOT DETECTED Final   Bordetella Parapertussis NOT DETECTED NOT DETECTED Final   Chlamydophila pneumoniae NOT DETECTED NOT DETECTED Final   Mycoplasma pneumoniae NOT DETECTED NOT DETECTED Final    Comment: Performed at [facility], [place]  [address], [place], [place] [address]     Radiology Studies: DG CHEST PORT 1 VIEW  Result Date: 01/10/2021 CLINICAL DATA:  Shortness of breath EXAM: PORTABLE CHEST 1 VIEW COMPARISON:  January 08, 2021 FINDINGS: The cardiomediastinal silhouette is unchanged in contour.Atherosclerotic calcifications of the aorta. Status post median sternotomy. Small LEFT pleural effusion. No pneumothorax. Bibasilar linear opacities, likely atelectasis. Visualized abdomen is unremarkable. Multilevel degenerative changes of the thoracic spine. IMPRESSION: Small LEFT pleural effusion and bibasilar atelectasis. Electronically Signed   By: Valentino Saxon MD   On: 01/10/2021 13:46     LOS: 5 days   Blain Pais, MD [facility]  01/11/2021, 4:51 PM

## 2021-01-11 NOTE — Progress Notes (Signed)
CBG done by Georgina Quint NT result 150, accidentally rejected but did not mean to.  RN instructed not to stick patient again.

## 2021-01-11 NOTE — Progress Notes (Signed)
Taken at 11:24

## 2021-01-11 NOTE — Plan of Care (Signed)
  Problem: Education: Goal: Knowledge of General Education information will improve Description Including pain rating scale, medication(s)/side effects and non-pharmacologic comfort measures Outcome: Progressing   Problem: Health Behavior/Discharge Planning: Goal: Ability to manage health-related needs will improve Outcome: Progressing   

## 2021-01-11 NOTE — Plan of Care (Signed)

## 2021-01-12 DIAGNOSIS — N170 Acute kidney failure with tubular necrosis: Secondary | ICD-10-CM

## 2021-01-12 LAB — BASIC METABOLIC PANEL
Anion gap: 9 (ref 5–15)
BUN: 7 mg/dL — ABNORMAL LOW (ref 8–23)
CO2: 24 mmol/L (ref 22–32)
Calcium: 8.3 mg/dL — ABNORMAL LOW (ref 8.9–10.3)
Chloride: 102 mmol/L (ref 98–111)
Creatinine, Ser: 0.81 mg/dL (ref 0.44–1.00)
GFR, Estimated: >60 mL/min (ref >60)
Glucose, Bld: 120 mg/dL — ABNORMAL HIGH (ref 70–99)
Potassium: 3.2 mmol/L — ABNORMAL LOW (ref 3.5–5.1)
Sodium: 135 mmol/L (ref 135–145)

## 2021-01-12 LAB — MAGNESIUM: Magnesium: 1.4 mg/dL — ABNORMAL LOW (ref 1.7–2.4)

## 2021-01-12 LAB — GLUCOSE, CAPILLARY
Glucose-Capillary: 150 mg/dL — ABNORMAL HIGH (ref 70–99)
Glucose-Capillary: 104 mg/dL — ABNORMAL HIGH (ref 70–99)
Glucose-Capillary: 122 mg/dL — ABNORMAL HIGH (ref 70–99)
Glucose-Capillary: 127 mg/dL — ABNORMAL HIGH (ref 70–99)

## 2021-01-12 MED ORDER — POTASSIUM CHLORIDE CRYS ER 20 MEQ PO TBCR
40.0000 meq | EXTENDED_RELEASE_TABLET | Freq: Once | ORAL | Status: AC
  Administered 2021-01-12: 10 meq via ORAL
  Filled 2021-01-12: qty 2

## 2021-01-12 MED ORDER — TORSEMIDE 20 MG PO TABS
10.0000 mg | ORAL_TABLET | Freq: Every day | ORAL | Status: DC
  Administered 2021-01-12: 10 mg via ORAL
  Filled 2021-01-12: qty 1

## 2021-01-12 MED ORDER — POTASSIUM CHLORIDE CRYS ER 20 MEQ PO TBCR: 40.0000 meq | EXTENDED_RELEASE_TABLET | Freq: Once | ORAL | Status: DC

## 2021-01-12 MED ORDER — MAGNESIUM SULFATE 2 GM/50ML IV SOLN
2.0000 g | Freq: Once | INTRAVENOUS | Status: AC
  Administered 2021-01-12: 2 g via INTRAVENOUS
  Filled 2021-01-12: qty 50

## 2021-01-12 MED ORDER — POTASSIUM CHLORIDE 20 MEQ PO PACK
40.0000 meq | PACK | Freq: Once | ORAL | Status: DC
  Filled 2021-01-12: qty 2

## 2021-01-12 NOTE — Progress Notes (Signed)
[facility]                                                                              Patient Demographics  Megan [name], is a 75 y.o. female, DOB - 21-Jun-1946, [id number]  Admit date - 01/05/2021   Admitting Physician Norval Morton, MD  Outpatient Primary MD for the patient is [name], MD  Outpatient specialists:   LOS - 6  days   Medical records reviewed and are as summarized below:    Chief Complaint  Patient presents with  . Diarrhea  . Nausea       Brief summary   75 year old female with HTN, HLD, CAD/PAD, COPD, T2DM, history of back surgery and mildly issues with balance since surgeryn and walks with cane at baseline, admitted 01/06/21 with diarrhea-nonbloody and generalized weakness x4 days prior to admission.   Patient was found to have AKI secondary to diarrhea.  Assessment & Plan   Acute diarrhea -Improving, negative for C. difficile.  GI pathogen panel negative -CT abdomen pelvis showed changes of mild diverticulitis in the sigmoid colon, no abscess or perforation. -Patient was placed on IV Zosyn. -Diarrhea is now improving, patient is tolerating regular diet. -GI was consulted, recommended maintain antibiotics for 10 to 14-day course for treatment of diverticulitis.  Hold any type of repeat endoscopic evaluation as she just had a colonoscopy within the last 2 years.  Recommended outpatient follow-up with [name]. -Patient did not tolerate Augmentin well, Zosyn was resumed.  Will transition to oral Cipro and Flagyl if tolerated in a.m.   Acute kidney injury -Likely prerenal secondary to volume depletion, medications including HCTZ losartan and torsemide -Creatinine 3.29 at the time of admission -She was placed on IV fluid hydration, creatinine has normalized, 0.8.  IV fluids have been discontinued, patient received IV Lasix intermittently for the fluid overload. - Continue to hold HCTZ losartan.  However patient may resume  torsemide, I's and O's with 4.5 L positive once consistent p.o. intake -Overnight 2 episodes of nonbloody diarrhea  Acute metabolic encephalopathy -During hospitalization patient was noted to have vaccinating mentation -Currently alert and oriented x3, MRI brain negative. -Outpatient follow-up with PCP and referral to neurology for cognitive evaluation  E. coli UTI, POA -Urine culture showed more than 100,000 colonies of E. coli, pansensitive -Patient did not tolerate Augmentin well, Zosyn was resumed  Acute on chronic diastolic CHF -Likely precipitated due to fluid overload.  2D echo 2/4 had   EF of 60 to 65% with grade 1 diastolic dysfunction.  -Patient received IV Lasix, will resume oral torsemide 10 mg daily  Acute hypoxic respiratory failure -During hospitalization, patient was noted to have hypoxia with O2 sats in mid 80s -Patient was placed on IV Lasix for possible fluid overload, BNP 862 -Respiratory viral panel negative.  Chest x-ray 2/5 showed left pleural effusion and bilateral atelectasis -Placed on home dose of oral torsemide 10 mg daily, wean O2 as tolerated.  Not on O2 at home.  Hyponatremia -Sodium 130 at the time of admission, likely due to diarrhea. -Now improved, 135   Hypokalemia Likely due to diarrhea and Lasix,  potassium 3.2, -Replaced   Diabetes mellitus type 2, NIDDM -CBGs controlled, continue regular diet  GERD -Continue PPI  Oral thrush Continue nystatin, Magic mouthwash   Essential hypertension, history of renal artery stenosis -BP currently stable, continue amlodipine, hydralazine.  Hold HCTZ, losartan -Continue torsemide  CAD/PAD history of CABG 1995 -Continue statin, aspirin  COPD without acute exacerbation -Currently no wheezing, continue home inhalers, nebs as needed -Incentive spirometry  History of AAA  -Outpatient follow-up with vascular surgery.  Status post stenting of the AAA in 2009 abdominal ultrasound showed 4 cm  in June 2019 -  outpatient follow-up with vascular  Hyperlipidemia -Continue Lipitor  Generalized debility -Sitting up in the chair, discussed in detail with the patient,to work with PT today, if diarrhea continues to improve, possible DC home in a.m.  Per patient, lives at home with her husband and his caregiver for him. -States she prefers to go home, does not feel at baseline yet today.   Code Status: Full code DVT Prophylaxis: Heparin subcu Level of Care: Level of care: Med-Surg Family Communication: Discussed all imaging results, lab results, explained to the patient   Disposition Plan:     Status is: Inpatient  Remains inpatient appropriate because:Inpatient level of care appropriate due to severity of illness   Dispo: The patient is from: Home              Anticipated d/c is to: Home              Anticipated d/c date is: 1 day              Patient currently is not medically stable to d/c.   Difficult to place patient No   Time Spent in minutes   40 minutes  Procedures:  None  Consultants:   Gastroenterology  Antimicrobials:   Anti-infectives (From admission, onward)   Start     Dose/Rate Route Frequency Ordered Stop   01/10/21 1400  piperacillin-tazobactam (ZOSYN) IVPB 3.375 g        3.375 g 12.5 mL/hr over 240 Minutes Intravenous Every 8 hours 01/10/21 1117     01/09/21 1000  amoxicillin-clavulanate (AUGMENTIN) 875-125 MG per tablet 1 tablet  Status:  Discontinued        1 tablet Oral Every 12 hours 01/09/21 0724 01/10/21 1117   01/08/21 1800  cephALEXin (KEFLEX) capsule 500 mg  Status:  Discontinued        500 mg Oral Every 12 hours 01/08/21 1513 01/09/21 0724   01/06/21 1800  cefTRIAXone (ROCEPHIN) 2 g in sodium chloride 0.9 % 100 mL IVPB  Status:  Discontinued        2 g 200 mL/hr over 30 Minutes Intravenous Every 24 hours 01/06/21 1702 01/08/21 1513          Medications  Scheduled Meds: . aspirin EC  81 mg Oral Daily  . atorvastatin  80 mg  Oral Daily  . cycloSPORINE  1 drop Both Eyes BID  . fluticasone furoate-vilanterol  1 puff Inhalation Daily   And  . umeclidinium bromide  1 puff Inhalation Daily  . heparin  5,000 Units Subcutaneous Q8H  . magic mouthwash  5 mL Oral TID  . mouth rinse  15 mL Mouth Rinse BID  . pantoprazole  40 mg Oral Daily  . potassium chloride  40 mEq Oral Once  . saccharomyces boulardii  250 mg Oral BID  . sodium chloride flush  3 mL Intravenous Q12H   Continuous Infusions: . piperacillin-tazobactam (  ZOSYN)  IV 3.375 g (01/12/21 0538)   PRN Meds:.acetaminophen **OR** acetaminophen, oxyCODONE **AND** acetaminophen, albuterol, alum & mag hydroxide-simeth, hydrocortisone cream, loperamide, ondansetron **OR** ondansetron (ZOFRAN) IV, simethicone, traZODone, white petrolatum      Subjective:   Megan [name] was seen and examined today.  Overnight 2 episodes of diarrhea, nonbloody.  No fevers or chills.  Patient denies abdominal pain, nausea or vomiting, numbess, tingling. No acute events overnight.  + Generalized weakness  Objective:   Vitals:   01/11/21 1920 01/11/21 2220 01/12/21 0725 01/12/21 1111  BP:   (!) 148/58 (!) 138/54  Pulse:   70 68  Resp:   17   Temp: 98.1 F (36.7 C) 98.4 F (36.9 C) 98.2 F (36.8 C) 98 F (36.7 C)  TempSrc: Oral Oral Oral Oral  SpO2:   92%     Intake/Output Summary (Last 24 hours) at 01/12/2021 1145 Last data filed at 01/12/2021 1000 Gross per 24 hour  Intake 483 ml  Output --  Net 483 ml     Wt Readings from Last 3 Encounters:  11/05/20 61.2 kg  09/26/20 60.8 kg  06/18/20 69.3 kg     Exam  General: Alert and oriented x 3, NAD  Cardiovascular: S1 S2 auscultated, no murmurs, RRR  Respiratory: Clear to auscultation bilaterally, no wheezing, rales or rhonchi  Gastrointestinal: Soft, nontender, nondistended, + bowel sounds  Ext: no pedal edema bilaterally  Neuro: no new deficits  Musculoskeletal: No digital cyanosis, clubbing  Skin: No  rashes  Psych: Normal affect and demeanor, alert and oriented x3    Data Reviewed:  I have personally reviewed following labs and imaging studies  Micro Results Recent Results (from the past 240 hour(s))  SARS Coronavirus 2 by RT PCR (hospital order, performed in [place] hospital lab) Nasopharyngeal Nasopharyngeal Swab     Status: None   Collection Time: 01/06/21 10:42 AM   Specimen: Nasopharyngeal Swab  Result Value Ref Range Status   SARS Coronavirus 2 NEGATIVE NEGATIVE Final    Comment: (NOTE) SARS-CoV-2 target nucleic acids are NOT DETECTED.  The SARS-CoV-2 RNA is generally detectable in upper and lower respiratory specimens during the acute phase of infection. The lowest concentration of SARS-CoV-2 viral copies this assay can detect is 250 copies / mL. A negative result does not preclude SARS-CoV-2 infection and should not be used as the sole basis for treatment or other patient management decisions.  A negative result may occur with improper specimen collection / handling, submission of specimen other than nasopharyngeal swab, presence of viral mutation(s) within the areas targeted by this assay, and inadequate number of viral copies (<250 copies / mL). A negative result must be combined with clinical observations, patient history, and epidemiological information.  Fact Sheet for Patients:   StrictlyIdeas.no  Fact Sheet for Healthcare Providers: BankingDealers.co.za  This test is not yet approved or  cleared by the [place] FDA and has been authorized for detection and/or diagnosis of SARS-CoV-2 by FDA under an Emergency Use Authorization (EUA).  This EUA will remain in effect (meaning this test can be used) for the duration of the COVID-19 declaration under Section 564(b)(1) of the Act, 21 U.S.C. section 360bbb-3(b)(1), unless the authorization is terminated or revoked sooner.  Performed at [facility], [place] [address] [place], [place] [address]   Culture, Urine     Status: Abnormal   Collection Time: 01/06/21 11:49 AM   Specimen: Urine, Random  Result Value Ref Range Status  Specimen Description URINE, RANDOM  Final   Special Requests   Final    NONE Performed at [facility], [place] [address], [place], [place] [address]    Culture >=100,000 COLONIES/mL ESCHERICHIA COLI (A)  Final   Report Status 01/08/2021 FINAL  Final   Organism ID, Bacteria ESCHERICHIA COLI (A)  Final      Susceptibility   Escherichia coli - MIC*    AMPICILLIN 4 SENSITIVE Sensitive     CEFAZOLIN <=4 SENSITIVE Sensitive     CEFEPIME <=0.12 SENSITIVE Sensitive     CEFTRIAXONE <=0.25 SENSITIVE Sensitive     CIPROFLOXACIN <=0.25 SENSITIVE Sensitive     GENTAMICIN <=1 SENSITIVE Sensitive     IMIPENEM <=0.25 SENSITIVE Sensitive     NITROFURANTOIN <=16 SENSITIVE Sensitive     TRIMETH/SULFA <=20 SENSITIVE Sensitive     AMPICILLIN/SULBACTAM <=2 SENSITIVE Sensitive     PIP/TAZO <=4 SENSITIVE Sensitive     * >=100,000 COLONIES/mL ESCHERICHIA COLI  Gastrointestinal Panel by PCR , Stool     Status: None   Collection Time: 01/06/21  5:14 PM   Specimen: Stool  Result Value Ref Range Status   Campylobacter species NOT DETECTED NOT DETECTED Final   Plesimonas shigelloides NOT DETECTED NOT DETECTED Final   Salmonella species NOT DETECTED NOT DETECTED Final   Yersinia enterocolitica NOT DETECTED NOT DETECTED Final   Vibrio species NOT DETECTED NOT DETECTED Final   Vibrio cholerae NOT DETECTED NOT DETECTED Final   Enteroaggregative E coli (EAEC) NOT DETECTED NOT DETECTED Final   Enteropathogenic E coli (EPEC) NOT DETECTED NOT DETECTED Final   Enterotoxigenic E coli (ETEC) NOT DETECTED NOT DETECTED Final   Shiga like toxin producing E coli (STEC) NOT DETECTED NOT DETECTED Final   Shigella/Enteroinvasive E coli (EIEC) NOT DETECTED NOT DETECTED Final   Cryptosporidium NOT DETECTED NOT DETECTED Final   Cyclospora  cayetanensis NOT DETECTED NOT DETECTED Final   Entamoeba histolytica NOT DETECTED NOT DETECTED Final   Giardia lamblia NOT DETECTED NOT DETECTED Final   Adenovirus F40/41 NOT DETECTED NOT DETECTED Final   Astrovirus NOT DETECTED NOT DETECTED Final   Norovirus GI/GII NOT DETECTED NOT DETECTED Final   Rotavirus A NOT DETECTED NOT DETECTED Final   Sapovirus (I, II, IV, and V) NOT DETECTED NOT DETECTED Final    Comment: Performed at [facility], [place] [place], [place] [address]  C Difficile Quick Screen w PCR reflex     Status: None   Collection Time: 01/06/21  5:14 PM   Specimen: Stool  Result Value Ref Range Status   C Diff antigen NEGATIVE NEGATIVE Final   C Diff toxin NEGATIVE NEGATIVE Final   C Diff interpretation No C. difficile detected.  Final    Comment: Performed at [facility], [place] [address], [place], [place] [address]  Culture, blood (routine x 2)     Status: None (Preliminary result)   Collection Time: 01/10/21 11:27 AM   Specimen: BLOOD  Result Value Ref Range Status   Specimen Description BLOOD LEFT ANTECUBITAL  Final   Special Requests   Final    BOTTLES DRAWN AEROBIC ONLY Blood Culture adequate volume   Culture   Final    NO GROWTH 2 DAYS Performed at [facility], [place] [address], [place], [place] [address]    Report Status PENDING  Incomplete  Culture, blood (routine x 2)     Status: None (Preliminary result)   Collection Time: 01/10/21 11:27 AM  Specimen: BLOOD  Result Value Ref Range Status   Specimen Description BLOOD RIGHT ANTECUBITAL  Final   Special Requests   Final    BOTTLES DRAWN AEROBIC ONLY Blood Culture results may not be optimal due to an inadequate volume of blood received in culture bottles   Culture   Final    NO GROWTH 2 DAYS Performed at [facility] [address] [place], [place] [address]    Report Status PENDING  Incomplete  Respiratory (~20 pathogens) panel by PCR     Status: None   Collection  Time: 01/10/21  9:56 PM   Specimen: Nasopharyngeal Swab; Respiratory  Result Value Ref Range Status   Adenovirus NOT DETECTED NOT DETECTED Final   Coronavirus 229E NOT DETECTED NOT DETECTED Final    Comment: (NOTE) The Coronavirus on the Respiratory Panel, DOES NOT test for the novel  Coronavirus (2019 nCoV)    Coronavirus HKU1 NOT DETECTED NOT DETECTED Final   Coronavirus NL63 NOT DETECTED NOT DETECTED Final   Coronavirus OC43 NOT DETECTED NOT DETECTED Final   Metapneumovirus NOT DETECTED NOT DETECTED Final   Rhinovirus / Enterovirus NOT DETECTED NOT DETECTED Final   Influenza A NOT DETECTED NOT DETECTED Final   Influenza B NOT DETECTED NOT DETECTED Final   Parainfluenza Virus 1 NOT DETECTED NOT DETECTED Final   Parainfluenza Virus 2 NOT DETECTED NOT DETECTED Final   Parainfluenza Virus 3 NOT DETECTED NOT DETECTED Final   Parainfluenza Virus 4 NOT DETECTED NOT DETECTED Final   Respiratory Syncytial Virus NOT DETECTED NOT DETECTED Final   Bordetella pertussis NOT DETECTED NOT DETECTED Final   Bordetella Parapertussis NOT DETECTED NOT DETECTED Final   Chlamydophila pneumoniae NOT DETECTED NOT DETECTED Final   Mycoplasma pneumoniae NOT DETECTED NOT DETECTED Final    Comment: Performed at [facility], [place]. [address], [place], [place] [address]    Radiology Reports CT ABDOMEN PELVIS WO CONTRAST  Result Date: 01/08/2021 CLINICAL DATA:  Abdominal pain and diarrhea EXAM: CT ABDOMEN AND PELVIS WITHOUT CONTRAST TECHNIQUE: Multidetector CT imaging of the abdomen and pelvis was performed following the standard protocol without IV contrast. COMPARISON:  Chest x-ray from earlier in the same day. FINDINGS: Lower chest: Mild emphysematous changes are noted. Mild bibasilar atelectatic changes are seen. Hepatobiliary: Gallbladder is well distended without evidence of cholelithiasis. No inflammatory changes are seen. The liver is unremarkable. Pancreas: Pancreas is atrophic but otherwise within  normal limits. Spleen: Scattered calcified granulomas are noted in the spleen. Adrenals/Urinary Tract: Adrenal glands are within normal limits. Nonobstructing left renal calculi are noted measuring 1-2 mm. No ureteral stones are seen. The bladder is partially distended. Stomach/Bowel: Diffuse diverticulosis of the sigmoid colon is noted. Wall thickening is noted consistent with smooth muscle hypertrophy. Minimal pericolonic inflammatory changes are seen without abscess or perforation. No obstructive changes are noted. The appendix is within normal limits. No inflammatory changes are seen. The small bowel and stomach are unremarkable. Vascular/Lymphatic: Atherosclerotic calcifications of the aorta are noted. Stent graft is noted in place. Aneurysm sac again measures 4 cm but stable. No significant lymphadenopathy is noted. Reproductive: Status post hysterectomy. No adnexal masses. Other: No abdominal wall hernia or abnormality. No abdominopelvic ascites. Musculoskeletal: Postoperative and degenerative changes of lumbar spine are noted. IMPRESSION: Mild bibasilar atelectatic changes. Changes of mild diverticulitis in the sigmoid colon. No abscess or perforation is noted. Of prior aortic stent graft therapy. 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 Electronically Signed   By: Elta Guadeloupe  Lukens M.D.   On: 01/08/2021 19:40   CT HEAD WO CONTRAST  Result Date: 01/06/2021 CLINICAL DATA:  Neuro deficit, acute, stroke suspected Mental status change, unknown cause Patient reports dizziness and speech issues for 4 days. EXAM: CT HEAD WITHOUT CONTRAST TECHNIQUE: Contiguous axial images were obtained from the base of the skull through the vertex without intravenous contrast. COMPARISON:  Brain MRI 09/30/2020 FINDINGS: Brain: Brain volume is normal for age. No intracranial hemorrhage, mass effect, or midline shift. No hydrocephalus. The basilar cisterns are patent. 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 No evidence of territorial  infarct or acute ischemia. No extra-axial or intracranial fluid collection. Vascular: Atherosclerosis of skullbase vasculature without hyperdense vessel or abnormal calcification. Skull: No fracture or focal lesion. Sinuses/Orbits: Chronic opacification of lower mastoid air cells. Paranasal sinuses are clear. Bilateral cataract resection. Other: None. IMPRESSION: No acute intracranial abnormality. Electronically Signed   By: Keith Rake M.D.   On: 01/06/2021 20:36   MR BRAIN WO CONTRAST  Result Date: 01/07/2021 CLINICAL DATA:  Encephalopathy EXAM: MRI HEAD WITHOUT CONTRAST TECHNIQUE: Multiplanar, multiecho pulse sequences of the brain and surrounding structures were obtained without intravenous contrast. COMPARISON:  None. FINDINGS: Brain: No acute infarct, mass effect or extra-axial collection. Focus of chronic microhemorrhage in the left frontal lobe. No acute hemorrhage. There is multifocal hyperintense T2-weighted signal within the white matter. Parenchymal volume and CSF spaces are normal. Incidentally noted pericallosal lipoma, unchanged. Vascular: Major flow voids are preserved. Skull and upper cervical spine: Normal calvarium and skull base. Visualized upper cervical spine and soft tissues are normal. Sinuses/Orbits:No paranasal sinus fluid levels or advanced mucosal thickening. No mastoid or middle ear effusion. Normal orbits. IMPRESSION: 1. No acute intracranial abnormality. 2. 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 Electronically Signed   By: Ulyses Jarred M.D.   On: 01/07/2021 22:22   DG CHEST PORT 1 VIEW  Result Date: 01/10/2021 CLINICAL DATA:  Shortness of breath EXAM: PORTABLE CHEST 1 VIEW COMPARISON:  January 08, 2021 FINDINGS: The cardiomediastinal silhouette is unchanged in contour.Atherosclerotic calcifications of the aorta. Status post median sternotomy. Small LEFT pleural effusion. No pneumothorax. Bibasilar linear opacities, likely  atelectasis. Visualized abdomen is unremarkable. Multilevel degenerative changes of the thoracic spine. IMPRESSION: Small LEFT pleural effusion and bibasilar atelectasis. Electronically Signed   By: Valentino Saxon MD   On: 01/10/2021 13:46   DG CHEST PORT 1 VIEW  Result Date: 01/08/2021 CLINICAL DATA:  Hypoxia. EXAM: PORTABLE CHEST 1 VIEW COMPARISON:  Chest x-ray 07/27/2021 FINDINGS: The cardiac silhouette, mediastinal and hilar contours are within normal limits and stable given the AP projection and portable technique. 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 Low lung volumes with vascular crowding and streaky atelectasis. No infiltrates or effusions. No pulmonary edema. Mild to moderate eventration of the right hemidiaphragm. The bony thorax is intact. IMPRESSION: Low lung volumes with vascular crowding and streaky atelectasis. Electronically Signed   By: Marijo Sanes M.D.   On: 01/08/2021 15:47   DG Chest Port 1 View  Result Date: 01/07/2021 CLINICAL DATA:  Hiccups and hypoxia EXAM: PORTABLE CHEST 1 VIEW COMPARISON:  06/16/2020 FINDINGS: Patient rotated right. Prior median sternotomy. Midline trachea. Mild cardiomegaly. Mitral annular calcifications. Atherosclerosis in the transverse aorta. No pleural effusion or pneumothorax. Basilar predominant interstitial thickening is similar and may relate to chronic bronchitis or prior smoking. No lobar consolidation. IMPRESSION: No acute cardiopulmonary disease. Aortic Atherosclerosis (ICD10-I70.0). Electronically Signed   By: Abigail Miyamoto  M.D.   On: 01/07/2021 15:55   DG ABD ACUTE 2+V W 1V CHEST  Result Date: 01/06/2021 CLINICAL DATA:  Diarrhea. EXAM: DG ABDOMEN ACUTE WITH 1 VIEW CHEST COMPARISON:  Chest x-ray dated June 16, 2020. FINDINGS: There is no evidence of dilated bowel loops or free intraperitoneal air. No radiopaque calculi or other significant radiographic abnormality is seen. Prior EVAR and lumbar fusion. Heart size and  mediastinal contours are within normal limits. Atherosclerotic calcification of the aortic arch. Prior CABG. Both lungs are clear. IMPRESSION: Negative abdominal radiographs.  No acute cardiopulmonary disease. Electronically Signed   By: Titus Dubin M.D.   On: 01/06/2021 12:19   ECHOCARDIOGRAM COMPLETE  Result Date: 01/09/2021    ECHOCARDIOGRAM REPORT   Patient Name:   DALIAH AIKIN [facility] Date of Exam: 01/09/2021 Medical Rec #:  [id number]           Height:       60.0 in Accession #:    [id number]          Weight:       135.0 lb Date of Birth:  [birth year]           BSA:          1.579 m Patient Age:    4 years            BP:           114/42 mmHg Patient Gender: F                   HR:           74 bpm. Exam Location:  Inpatient Procedure: 2D Echo, Color Doppler and Cardiac Doppler Indications:    R01.1 Murmur  History:        Patient has prior history of Echocardiogram examinations, most                 recent 11/16/2014. Prior CABG, COPD; Risk Factors:Hypertension,                 Diabetes and Dyslipidemia.  Sonographer:    Raquel Sarna Senior RDCS Referring Phys: [place]  1. Left ventricular ejection fraction, by estimation, is 60 to 65%. The left ventricle has normal function. The left ventricle has no regional wall motion abnormalities. There is mild asymmetric left ventricular hypertrophy of the basal-septal segment. Left ventricular diastolic parameters are consistent with Grade I diastolic dysfunction (impaired relaxation).  2. Right ventricular systolic function is normal. The right ventricular size is normal. There is normal pulmonary artery systolic pressure.  3. Left atrial size was severely dilated.  4. The mitral valve is normal in structure. Mild mitral valve regurgitation. No evidence of mitral stenosis. The mean mitral valve gradient is 4.0 mmHg. Moderate mitral annular calcification.  5. Tricuspid valve regurgitation is moderate.  6. The aortic valve is normal in structure.  Aortic valve regurgitation is not visualized. No aortic stenosis is present.  7. The inferior vena cava is normal in size with greater than 50% respiratory variability, suggesting right atrial pressure of 3 mmHg. FINDINGS  Left Ventricle: Left ventricular ejection fraction, by estimation, is 60 to 65%. The left ventricle has normal function. The left ventricle has no regional wall motion abnormalities. The left ventricular internal cavity size was normal in size. There is  mild asymmetric left ventricular hypertrophy of the basal-septal segment. Left ventricular diastolic parameters are consistent with Grade I diastolic dysfunction (impaired relaxation). Right Ventricle: The right ventricular  size is normal. No increase in right ventricular wall thickness. Right ventricular systolic function is normal. There is normal pulmonary artery systolic pressure. The tricuspid regurgitant velocity is 2.84 m/s, and  with an assumed right atrial pressure of 3 mmHg, the estimated right ventricular systolic pressure is 35.3 mmHg. Left Atrium: Left atrial size was severely dilated. Right Atrium: Right atrial size was normal in size. Pericardium: There is no evidence of pericardial effusion. Mitral Valve: The mitral valve is normal in structure. There is mild thickening of the mitral valve leaflet(s). Moderate mitral annular calcification. Mild mitral valve regurgitation. No evidence of mitral valve stenosis. MV peak gradient, 7.8 mmHg. The mean mitral valve gradient is 4.0 mmHg. Tricuspid Valve: The tricuspid valve is normal in structure. Tricuspid valve regurgitation is moderate . No evidence of tricuspid stenosis. Aortic Valve: The aortic valve is normal in structure. Aortic valve regurgitation is not visualized. No aortic stenosis is present. Pulmonic Valve: The pulmonic valve was normal in structure. Pulmonic valve regurgitation is mild. No evidence of pulmonic stenosis. Aorta: The aortic root is normal in size and structure.  Venous: The inferior vena cava is normal in size with greater than 50% respiratory variability, suggesting right atrial pressure of 3 mmHg. IAS/Shunts: No atrial level shunt detected by color flow Doppler.  LEFT VENTRICLE PLAX 2D LVIDd:         4.00 cm  Diastology LVIDs:         2.50 cm  LV e' medial:    6.74 cm/s LV PW:         1.10 cm  LV E/e' medial:  13.9 LV IVS:        1.30 cm  LV e' lateral:   9.90 cm/s LVOT diam:     1.90 cm  LV E/e' lateral: 9.5 LV SV:         90 LV SV Index:   57 LVOT Area:     2.84 cm  RIGHT VENTRICLE RV S prime:     8.70 cm/s TAPSE (M-mode): 1.9 cm LEFT ATRIUM             Index       RIGHT ATRIUM           Index LA diam:        4.60 cm 2.91 cm/m  RA Area:     15.10 cm LA Vol (A2C):   89.4 ml 56.60 ml/m RA Volume:   37.80 ml  23.93 ml/m LA Vol (A4C):   75.2 ml 47.61 ml/m LA Biplane Vol: 86.0 ml 54.45 ml/m  AORTIC VALVE LVOT Vmax:   142.00 cm/s LVOT Vmean:  93.100 cm/s LVOT VTI:    0.317 m  AORTA Ao Root diam: 2.70 cm Ao Asc diam:  3.10 cm MITRAL VALVE                TRICUSPID VALVE MV Area (PHT): 2.66 cm     TR Peak grad:   32.3 mmHg MV Area VTI:   2.25 cm     TR Vmax:        284.00 cm/s MV Peak grad:  7.8 mmHg MV Mean grad:  4.0 mmHg     SHUNTS MV Vmax:       1.40 m/s     Systemic VTI:  0.32 m MV Vmean:      98.3 cm/s    Systemic Diam: 1.90 cm MV Decel Time: 285 msec MV E velocity: 93.80 cm/s MV A velocity: 123.00 cm/s MV  E/A ratio:  0.76 Candee Furbish MD Electronically signed by Candee Furbish MD Signature Date/Time: 01/09/2021/12:31:31 PM    Final     Lab Data:  CBC: Recent Labs  Lab 01/07/21 9371 01/08/21 0132 01/09/21 0124 01/10/21 0056 01/11/21 0156  WBC 12.6* 8.7 12.0* 13.1* 11.9*  NEUTROABS  --   --   --   --  9.8*  HGB 13.3 12.0 11.5* 11.2* 11.5*  HCT 39.8 35.5* 34.9* 34.9* 33.3*  MCV 88.1 88.8 88.4 88.1 86.0  PLT 243 218 235 280 696   Basic Metabolic Panel: Recent Labs  Lab 01/06/21 1043 01/07/21 0220 01/08/21 0132 01/09/21 0124 01/10/21 0056  01/11/21 0156 01/12/21 0046  NA  --    < > 133* 135 135 137 135  K  --    < > 3.7 3.3* 3.8 3.3* 3.2*  CL  --    < > 101 103 102 104 102  CO2  --    < > 21* 20* 25 23 24   GLUCOSE  --    < > 80 86 133* 110* 120*  BUN  --    < > 23 20 19 12  7*  CREATININE  --    < > 2.13* 1.65* 1.28* 0.93 0.81  CALCIUM  --    < > 8.5* 8.6* 8.5* 8.7* 8.3*  MG 1.6*  --   --  1.6*  --  1.6* 1.4*   < > = values in this interval not displayed.   GFR: CrCl cannot be calculated (Unknown ideal weight.). Liver Function Tests: Recent Labs  Lab 01/06/21 0011  AST 36  ALT 22  ALKPHOS 67  BILITOT 1.0  PROT 5.8*  ALBUMIN 2.9*   Recent Labs  Lab 01/06/21 0011  LIPASE 43   No results for input(s): AMMONIA in the last 168 hours. Coagulation Profile: No results for input(s): INR, PROTIME in the last 168 hours. Cardiac Enzymes: Recent Labs  Lab 01/07/21 0220  CKTOTAL 68   BNP (last 3 results) No results for input(s): PROBNP in the last 8760 hours. HbA1C: No results for input(s): HGBA1C in the last 72 hours. CBG: Recent Labs  Lab 01/11/21 1142 01/11/21 1541 01/11/21 2117 01/12/21 0620 01/12/21 1113  GLUCAP 150* 150* 123* 104* 122*   Lipid Profile: No results for input(s): CHOL, HDL, LDLCALC, TRIG, CHOLHDL, LDLDIRECT in the last 72 hours. Thyroid Function Tests: No results for input(s): TSH, T4TOTAL, FREET4, T3FREE, THYROIDAB in the last 72 hours. Anemia Panel: No results for input(s): VITAMINB12, FOLATE, FERRITIN, TIBC, IRON, RETICCTPCT in the last 72 hours. Urine analysis:    Component Value Date/Time   COLORURINE AMBER (A) 01/05/2021 2349   APPEARANCEUR HAZY (A) 01/05/2021 2349   LABSPEC 1.012 01/05/2021 2349   PHURINE 5.0 01/05/2021 2349   GLUCOSEU NEGATIVE 01/05/2021 2349   GLUCOSEU NEGATIVE 09/09/2015 1611   HGBUR MODERATE (A) 01/05/2021 2349   BILIRUBINUR NEGATIVE 01/05/2021 2349   BILIRUBINUR negative 01/13/2018 [place] 01/05/2021 2349   PROTEINUR 30 (A)  01/05/2021 2349   UROBILINOGEN 0.2 01/13/2018 1131   UROBILINOGEN 0.2 09/09/2015 1611   NITRITE NEGATIVE 01/05/2021 2349   LEUKOCYTESUR LARGE (A) 01/05/2021 2349     Jesselyn Rask M.D. [facility] 01/12/2021, 11:45 AM   Call night coverage person covering after 7pm

## 2021-01-12 NOTE — Progress Notes (Signed)
Physical Therapy Treatment Patient Details Name: Megan [name] MRN: [id number] DOB: [birth year] Today's Date: 01/12/2021    History of Present Illness 75 year old female with HTN, HLD, CAD/PAD, COPD, T2DM, history of back surgery and mildly issues with balance since surgeryn and walks with cane at baseline, admitted 01/06/21 with diarrhea-nonbloody and generalized weakness x4 days prior to admission.Found to have UTI but her diarrhea has continued so work up in progress.  Patient also had episode of fluid overload/acute respiratory failure.    PT Comments    Pt reports feeling better today and is eager to walk with therapy. Trialed use of SPC and while able to ambulate with min guard, gait is mildly unsteady and velocity in slowed. With use of Rollator, pt stability much increased and pt confidence higher allowing for improved safety and ambulation velocity. Given pt's slight stature she will require the smallest, lightest weight Rollator available. Pt also requests [facility] for HHPT if available as her husband is already receiving services through them. PT will follow back tomorrow for stair training prior to d/c home.    Follow Up Recommendations  Home health PT;Supervision - Intermittent     Equipment Recommendations  Rolling walker with 5" wheels (due to pt stature requires smallest, lightest weight Rollator available)       Precautions / Restrictions Precautions Precautions: Fall Restrictions Weight Bearing Restrictions: No    Mobility  Bed Mobility               General bed mobility comments: In chair on arrival and asking to walk to the bathroom due to diarrhea.  Transfers Overall transfer level: Needs assistance Equipment used: Straight cane Transfers: Sit to/from Stand Sit to Stand: Min guard         General transfer comment: requires at least single UE support for standing  Ambulation/Gait Ambulation/Gait assistance: Min guard Gait Distance (Feet): 450  Feet (150 with SPC and 300 with Rollator) Assistive device: 4-wheeled walker;Straight cane Gait Pattern/deviations: Step-through pattern;Decreased stride length;Trunk flexed;Wide base of support;Drifts right/left Gait velocity: variable, improved with Rollator Gait velocity interpretation: <1.8 ft/sec, indicate of risk for recurrent falls General Gait Details: Pt is able to ambulate with SPC and min guard assist however is mildly unstable, pt request to trial Rollator again, and is supervision level for stability ambulating with Rollator, increased stability and confidence. Pt able to demonstrate good technique with utilization of brakes and sitting on Rollator.         Balance Overall balance assessment: Needs assistance Sitting-balance support: No upper extremity supported;Feet supported Sitting balance-Leahy Scale: Fair     Standing balance support: Bilateral upper extremity supported;During functional activity;No upper extremity supported Standing balance-Leahy Scale: Fair Standing balance comment: can stand statically without UE support, however needs at least 1 UE support dynamically                            Cognition Arousal/Alertness: Awake/alert Behavior During Therapy: WFL for tasks assessed/performed Overall Cognitive Status: Within Functional Limits for tasks assessed                                           General Comments General comments (skin integrity, edema, etc.): VSS able to ambulate on RA with occasional dip in SaO2 to 89%O2, quickly recovers without cuing      Pertinent Vitals/Pain Pain  Assessment: No/denies pain           PT Goals (current goals can now be found in the care plan section) Acute Rehab PT Goals Patient Stated Goal: to gohome PT Goal Formulation: With patient Time For Goal Achievement: 01/24/21 Potential to Achieve Goals: Good Progress towards PT goals: Progressing toward goals    Frequency    Min  3X/week      PT Plan Equipment recommendations need to be updated       AM-PAC PT "6 Clicks" Mobility   Outcome Measure  Help needed turning from your back to your side while in a flat bed without using bedrails?: A Little Help needed moving from lying on your back to sitting on the side of a flat bed without using bedrails?: A Little Help needed moving to and from a bed to a chair (including a wheelchair)?: A Little Help needed standing up from a chair using your arms (e.g., wheelchair or bedside chair)?: A Little Help needed to walk in hospital room?: A Little Help needed climbing 3-5 steps with a railing? : A Little 6 Click Score: 18    End of Session Equipment Utilized During Treatment: Gait belt Activity Tolerance: Patient tolerated treatment well Patient left: in chair;with call bell/phone within reach;with chair alarm set;with family/visitor present Nurse Communication: Mobility status PT Visit Diagnosis: Muscle weakness (generalized) (M62.81)     Time: 7741-2878 PT Time Calculation (min) (ACUTE ONLY): 27 min  Charges:  $Gait Training: 23-37 mins                     Martyna Thorns B. Migdalia Dk PT, DPT Acute Rehabilitation Services Pager [phone] Office [phone]    [place] 01/12/2021, 11:09 AM

## 2021-01-12 NOTE — Plan of Care (Signed)
  Problem: Education: °Goal: Knowledge of General Education information will improve °Description: Including pain rating scale, medication(s)/side effects and non-pharmacologic comfort measures °Outcome: Progressing °  °Problem: Clinical Measurements: °Goal: Will remain free from infection °Outcome: Progressing °Goal: Respiratory complications will improve °Outcome: Progressing °  °Problem: Activity: °Goal: Risk for activity intolerance will decrease °Outcome: Progressing °  °Problem: Nutrition: °Goal: Adequate nutrition will be maintained °Outcome: Progressing °  °Problem: Elimination: °Goal: Will not experience complications related to bowel motility °Outcome: Progressing °  °

## 2021-01-12 NOTE — Plan of Care (Signed)
  Problem: Education: Goal: Knowledge of General Education information will improve Description Including pain rating scale, medication(s)/side effects and non-pharmacologic comfort measures Outcome: Progressing   

## 2021-01-13 ENCOUNTER — Ambulatory Visit: Payer: Medicare Other | Admitting: Family Medicine

## 2021-01-13 LAB — BASIC METABOLIC PANEL
Anion gap: 11 (ref 5–15)
BUN: 6 mg/dL — ABNORMAL LOW (ref 8–23)
CO2: 27 mmol/L (ref 22–32)
Calcium: 8.7 mg/dL — ABNORMAL LOW (ref 8.9–10.3)
Chloride: 99 mmol/L (ref 98–111)
Creatinine, Ser: 0.85 mg/dL (ref 0.44–1.00)
GFR, Estimated: >60 mL/min (ref >60)
Glucose, Bld: 120 mg/dL — ABNORMAL HIGH (ref 70–99)
Potassium: 2.9 mmol/L — ABNORMAL LOW (ref 3.5–5.1)
Sodium: 137 mmol/L (ref 135–145)

## 2021-01-13 LAB — POTASSIUM: Potassium: 3.5 mmol/L (ref 3.5–5.1)

## 2021-01-13 LAB — MAGNESIUM: Magnesium: 1.7 mg/dL (ref 1.7–2.4)

## 2021-01-13 MED ORDER — LOSARTAN POTASSIUM-HCTZ 100-25 MG PO TABS: 1.0000 | ORAL_TABLET | Freq: Every day | ORAL | 3 refills | Status: DC

## 2021-01-13 MED ORDER — NYSTATIN 100000 UNIT/ML MT SUSP: 5.0000 mL | Freq: Four times a day (QID) | OROMUCOSAL | 1 refills | Status: DC

## 2021-01-13 MED ORDER — CIPROFLOXACIN HCL 500 MG PO TABS: 500.0000 mg | ORAL_TABLET | Freq: Two times a day (BID) | ORAL | 0 refills | Status: AC

## 2021-01-13 MED ORDER — POTASSIUM CHLORIDE ER 8 MEQ PO CPCR
40.0000 meq | ORAL_CAPSULE | Freq: Once | ORAL | Status: DC
  Filled 2021-01-13: qty 5

## 2021-01-13 MED ORDER — SACCHAROMYCES BOULARDII 250 MG PO CAPS: 250.0000 mg | ORAL_CAPSULE | Freq: Two times a day (BID) | ORAL | 0 refills | Status: DC

## 2021-01-13 MED ORDER — POTASSIUM CHLORIDE ER 10 MEQ PO TBCR
40.0000 meq | EXTENDED_RELEASE_TABLET | Freq: Once | ORAL | Status: AC
  Administered 2021-01-13: 40 meq via ORAL
  Filled 2021-01-13: qty 4

## 2021-01-13 MED ORDER — POTASSIUM CHLORIDE 20 MEQ PO PACK
40.0000 meq | PACK | Freq: Once | ORAL | Status: DC
  Filled 2021-01-13: qty 2

## 2021-01-13 MED ORDER — POTASSIUM CHLORIDE 10 MEQ/100ML IV SOLN
10.0000 meq | INTRAVENOUS | Status: DC
  Filled 2021-01-13: qty 100

## 2021-01-13 MED ORDER — POTASSIUM CHLORIDE CRYS ER 20 MEQ PO TBCR: 40.0000 meq | EXTENDED_RELEASE_TABLET | Freq: Once | ORAL | Status: DC

## 2021-01-13 MED ORDER — METRONIDAZOLE 500 MG PO TABS: 500.0000 mg | ORAL_TABLET | Freq: Three times a day (TID) | ORAL | 0 refills | Status: AC

## 2021-01-13 MED ORDER — TORSEMIDE 20 MG PO TABS: 10.0000 mg | ORAL_TABLET | Freq: Every day | ORAL | Status: DC | PRN

## 2021-01-13 MED ORDER — CIPROFLOXACIN HCL 500 MG PO TABS
500.0000 mg | ORAL_TABLET | Freq: Two times a day (BID) | ORAL | Status: DC
  Administered 2021-01-13: 500 mg via ORAL
  Filled 2021-01-13: qty 1

## 2021-01-13 MED ORDER — METRONIDAZOLE 500 MG PO TABS
500.0000 mg | ORAL_TABLET | Freq: Three times a day (TID) | ORAL | Status: DC
  Administered 2021-01-13: 500 mg via ORAL
  Filled 2021-01-13: qty 1

## 2021-01-13 MED ORDER — SIMETHICONE 80 MG PO CHEW: 80.0000 mg | CHEWABLE_TABLET | Freq: Four times a day (QID) | ORAL | 0 refills | Status: DC | PRN

## 2021-01-13 MED ORDER — POTASSIUM CHLORIDE ER 10 MEQ PO TBCR: 10.0000 meq | EXTENDED_RELEASE_TABLET | Freq: Two times a day (BID) | ORAL | Status: DC

## 2021-01-13 MED ORDER — MELOXICAM 15 MG PO TABS: 15.0000 mg | ORAL_TABLET | Freq: Every day | ORAL | Status: DC

## 2021-01-13 MED ORDER — ONDANSETRON 4 MG PO TBDP: 4.0000 mg | ORAL_TABLET | Freq: Three times a day (TID) | ORAL | 0 refills | Status: DC | PRN

## 2021-01-13 MED ORDER — LOPERAMIDE HCL 2 MG PO CAPS: 2.0000 mg | ORAL_CAPSULE | Freq: Four times a day (QID) | ORAL | 0 refills | Status: DC | PRN

## 2021-01-13 NOTE — Progress Notes (Signed)
Patient provided with detailed verbal discharge instructions. RN answered all questions. Pt VSS at d/c. IV removed. Pt belongings sent with patient. Pt d/c via wheelchair through [employer] entrance to private vehicle.

## 2021-01-13 NOTE — Discharge Summary (Signed)
Physician Discharge Summary   Patient ID: Megan [name] MRN: [id number] DOB/AGE: Aug 06, 1946 75 y.o.  Admit date: 01/05/2021 Discharge date: 01/13/2021  Primary Care Physician:  [name], MD   Recommendations for Outpatient Follow-up:  1. Follow up with PCP in 1-2 weeks 2. Please obtain BMP at the time of follow-up.  Home Health: Home health PT OT Equipment/Devices: Rolling walker  Discharge Condition: stable CODE STATUS: FULL  Diet recommendation: Soft diet   Discharge Diagnoses:    Acute diverticulitis . ARF (acute renal failure) ([facility]) . Diarrhea . Acute metabolic encephalopathy resolved . Leukocytosis . Acute E. coli UTI  . Hyponatremia . GERD (gastroesophageal reflux disease) . Essential hypertension Acute on chronic diastolic CHF Acute hypoxic respiratory failure secondary to fluid overload History of AAA Generalized debility COPD CAD/PAD  Consults:  GI    Allergies:   Allergies  Allergen Reactions  . Anticoagulant Compound Itching and Other (See Comments)    Hands and feet tingle and itch.   Darlin Coco [Valdecoxib] Other (See Comments)    Increased lft's   . Hydrocod Polst-Cpm Polst Er Rash  . Lipitor [Atorvastatin] Other (See Comments)    Leg weakness   . Mevacor [Lovastatin] Other (See Comments)    Muscle weakness   . Plavix [Clopidogrel Bisulfate] Itching and Other (See Comments)    Hands and feet tingle and itch  . Zocor [Simvastatin] Other (See Comments)    Bones hurt   . Doxycycline Diarrhea    N/v/d  . Metoprolol Other (See Comments)    Hair loss  . Morphine And Related Itching and Nausea Only  . Codeine Itching and Rash    Hyper-active  . Lisinopril Cough     DISCHARGE MEDICATIONS: Allergies as of 01/13/2021      Reactions   Anticoagulant Compound Itching, Other (See Comments)   Hands and feet tingle and itch.   Bextra [valdecoxib] Other (See Comments)   Increased lft's   Hydrocod Polst-cpm Polst Er Rash    Lipitor [atorvastatin] Other (See Comments)   Leg weakness   Mevacor [lovastatin] Other (See Comments)   Muscle weakness   Plavix [clopidogrel Bisulfate] Itching, Other (See Comments)   Hands and feet tingle and itch   Zocor [simvastatin] Other (See Comments)   Bones hurt   Doxycycline Diarrhea   N/v/d   Metoprolol Other (See Comments)   Hair loss   Morphine And Related Itching, Nausea Only   Codeine Itching, Rash   Hyper-active   Lisinopril Cough      Medication List    STOP taking these medications   methocarbamol 500 MG tablet Commonly known as: Robaxin     TAKE these medications   Accu-Chek FastClix Lancets Misc Check blood sugar once daily PRN   Accu-Chek SmartView test strip Generic drug: glucose blood CHECK BLOOD SUGAR ONCE DAILY AS NEEDED   albuterol 108 (90 Base) MCG/ACT inhaler Commonly known as: VENTOLIN HFA Inhale 2 puffs into the lungs every 4 (four) hours as needed for wheezing or shortness of breath.   ALPRAZolam 0.25 MG tablet Commonly known as: XANAX Take 1 tablet (0.25 mg total) by mouth at bedtime as needed for sleep.   amLODipine 5 MG tablet Commonly known as: NORVASC TAKE 1 TABLET BY MOUTH EVERY DAY   aspirin EC 81 MG tablet Take 1 tablet (81 mg total) by mouth daily.   atorvastatin 80 MG tablet Commonly known as: LIPITOR TAKE 1 TABLET BY MOUTH EVERY DAY   augmented betamethasone dipropionate 0.05 %  cream Commonly known as: DIPROLENE-AF Apply 1 application topically daily as needed (rash). For hands   B-100 COMPLEX PO Take 1 tablet by mouth at bedtime.   bismuth subsalicylate 240 XB/35HG suspension Commonly known as: PEPTO BISMOL Take 30 mLs by mouth every 6 (six) hours as needed for indigestion.   Breztri Aerosphere 160-9-4.8 MCG/ACT Aero Generic drug: Budeson-Glycopyrrol-Formoterol Inhale 2 puffs into the lungs in the morning and at bedtime. What changed:   how much to take  when to take this  reasons to take this    ciclopirox 0.77 % cream Commonly known as: LOPROX Apply 1 application topically daily as needed (hands fungus).   ciprofloxacin 500 MG tablet Commonly known as: CIPRO Take 1 tablet (500 mg total) by mouth 2 (two) times daily for 7 days.   Cordran 4 MCG/SQCM Tape Generic drug: Flurandrenolide Apply 1 each topically daily as needed (Rash).   cycloSPORINE 0.05 % ophthalmic emulsion Commonly known as: RESTASIS Place 1 drop into both eyes 2 (two) times daily.   gabapentin 300 MG capsule Commonly known as: NEURONTIN Take 1-2 capsules (300-600 mg total) by mouth 3 (three) times daily. What changed:   how much to take  when to take this   hydrALAZINE 10 MG tablet Commonly known as: APRESOLINE TAKE 1 TABLET BY MOUTH THREE TIMES A DAY What changed: when to take this   loperamide 2 MG capsule Commonly known as: IMODIUM Take 1 capsule (2 mg total) by mouth every 6 (six) hours as needed for diarrhea or loose stools. Also available OTC   losartan-hydrochlorothiazide 100-25 MG tablet Commonly known as: HYZAAR Take 1 tablet by mouth daily. Start taking on: January 15, 2021 What changed: These instructions start on January 15, 2021. If you are unsure what to do until then, ask your doctor or other care provider.   meloxicam 15 MG tablet Commonly known as: MOBIC Take 1 tablet (15 mg total) by mouth daily. Start taking on: January 15, 2021 What changed: These instructions start on January 15, 2021. If you are unsure what to do until then, ask your doctor or other care provider.   metroNIDAZOLE 500 MG tablet Commonly known as: FLAGYL Take 1 tablet (500 mg total) by mouth 3 (three) times daily for 7 days.   multivitamin with minerals tablet Take 1 tablet by mouth at bedtime.   nitroGLYCERIN 0.4 MG SL tablet Commonly known as: NITROSTAT Place 1 tablet (0.4 mg total) under the tongue every 5 (five) minutes as needed for chest pain. What changed: reasons to take this    nystatin 100000 UNIT/ML suspension Commonly known as: MYCOSTATIN Take 5 mLs (500,000 Units total) by mouth 4 (four) times daily for 10 days.   omeprazole 40 MG capsule Commonly known as: PRILOSEC TAKE 1 CAPSULE BY MOUTH TWICE A DAY--NEED APPT FOR FURTHER REFILLS What changed:   how much to take  how to take this  when to take this  additional instructions   ondansetron 4 MG disintegrating tablet Commonly known as: Zofran ODT Take 1 tablet (4 mg total) by mouth every 8 (eight) hours as needed for nausea or vomiting.   oxyCODONE-acetaminophen 10-325 MG tablet Commonly known as: Percocet Take 1 tablet by mouth every 6 (six) hours as needed for pain. What changed: when to take this   potassium chloride 10 MEQ tablet Commonly known as: KLOR-CON Take 1 tablet (10 mEq total) by mouth 2 (two) times daily.   saccharomyces boulardii 250 MG capsule Commonly known as: FLORASTOR Take  1 capsule (250 mg total) by mouth 2 (two) times daily. Any probiotic available is okay   simethicone 80 MG chewable tablet Commonly known as: MYLICON Chew 1 tablet (80 mg total) by mouth 4 (four) times daily as needed for flatulence (bloating). also available OTC   torsemide 20 MG tablet Commonly known as: DEMADEX Take 0.5 tablets (10 mg total) by mouth daily as needed (fluid).   tretinoin 0.1 % cream Commonly known as: RETIN-A Apply 1 application topically every other day. In the evening   VITAMIN D (ERGOCALCIFEROL) PO Take 1,000 Units by mouth every evening.            Durable Medical Equipment  (From admission, onward)         Start     Ordered   01/11/21 1357  For home use only DME 4 wheeled rolling walker with seat  Once       Question:  Patient needs a walker to treat with the following condition  Answer:  Shortness of breath   01/11/21 1359           Brief H and P: For complete details please refer to admission H and P, but in brief 75 year old female with HTN, HLD,  CAD/PAD, COPD, T2DM, history of back surgery and mildly issues with balance since surgeryn and walks with cane at baseline, admitted 01/06/21 with diarrhea-nonbloody and generalized weakness x4 days prior to admission.  Patient was found to have AKI secondary to diarrhea.  Hospital Course:   Acute diarrhea -Improving, negative for C. difficile.  GI pathogen panel negative -CT abdomen pelvis showed changes of mild diverticulitis in the sigmoid colon, no abscess or perforation.  Patient was placed on IV Zosyn.  Transition to oral ciprofloxacin and Flagyl at the time of discharge.  She was not able to tolerate Augmentin during hospitalization. -GI was consulted, recommended maintain antibiotics for 10 to 14-day course for treatment of diverticulitis.  Hold any type of repeat endoscopic evaluation as she just had a colonoscopy within the last 2 years.  Recommended outpatient follow-up with [name]. -Currently diarrhea improving, tolerating regular diet.  Recommended soft diet of full liquid diet as tolerated for this week.   Acute kidney injury -Likely prerenal secondary to volume depletion, medications including HCTZ losartan and torsemide -Creatinine 3.29 at the time of admission, patient was placed on IV fluids. -She was placed on IV fluid hydration, creatinine has improved, 0.8 at the time of discharge  -IV fluids have been discontinued, patient received IV Lasix intermittently for the fluid overload. -HCTZ and losartan were held , recommended to resume for hypertension in next few days   Acute metabolic encephalopathy -During hospitalization patient was noted to have vaccinating mentation -Currently alert and oriented x3, MRI brain negative. -Outpatient follow-up with PCP and referral to neurology for cognitive evaluation.  Currently she is alert and oriented.  E. coli UTI, POA -Urine culture showed more than 100,000 colonies of E. coli, pansensitive -Patient did not tolerate  Augmentin well, Zosyn was resumed -Ciprofloxacin will cover the E. coli UTI as well.  Acute on chronic diastolic CHF -Likely precipitated due to fluid overload.  2D echo 2/4 showed EF of 60 to 65% with grade 1 diastolic dysfunction.  -Patient received IV Lasix during hospitalization, torsemide was resumed. -She will resume torsemide as needed at home, currently off O2, lungs fairly clear on exam  Acute hypoxic respiratory failure -During hospitalization, patient was noted to have hypoxia with O2 sats in mid 80s -  Patient received IV Lasix for fluid overload, BNP 862 -Respiratory viral panel negative.  Chest x-ray 2/5 showed left pleural effusion and bilateral atelectasis -Patient was placed on oral torsemide.  Currently off oxygen, once clear to auscultation.  Recommended to continue her outpatient regimen of as needed torsemide.  Hyponatremia -Sodium 130 at the time of admission, likely due to diarrhea. -Sodium improved, 137   Hypokalemia Likely due to diarrhea and diuresis  -received 40 mEq p.o. today   Diabetes mellitus type 2, NIDDM -CBGs controlled, continue regular diet  GERD -Continue PPI  Oral thrush Continue nystatin s+s   Essential hypertension, history of renal artery stenosis -BP currently stable, continue amlodipine, hydralazine. -May resume HCTZ losartan, continue torsemide as needed  CAD/PAD history of CABG 1995 -Continue statin, aspirin  COPD without acute exacerbation -Currently no wheezing, continue home inhalers, nebs as needed  History of AAA  -Outpatient follow-up with vascular surgery.  Status post stenting of the AAA in 2009 abdominal ultrasound showed 4 cm in June 2019 -  outpatient follow-up with vascular surgery  Hyperlipidemia -Continue Lipitor  Generalized debility -Feels a lot better from the time of admission, TOC to arrange home health PT OT, rolling walker. Plan discussed with patient's POA, sister-in-law, Ms Graylin Shiver   Day of Discharge S: Feels much better, diarrhea improving, alert and oriented, tolerating diet, looking forward to go home.  BP (!) 146/57 (BP Location: Left Arm)   Pulse 68   Temp (!) 97.3 F (36.3 C) (Oral)   Resp 18   SpO2 98%   Physical Exam: General: Alert and awake oriented x3 not in any acute distress. HEENT: anicteric sclera, pupils reactive to light and accommodation CVS: S1-S2 clear no murmur rubs or gallops Chest: clear to auscultation bilaterally, no wheezing rales or rhonchi Abdomen: soft nontender, nondistended, normal bowel sounds Extremities: no cyanosis, clubbing or edema noted bilaterally Neuro: no new FND's    Get Medicines reviewed and adjusted: Please take all your medications with you for your next visit with your Primary MD  Please request your Primary MD to go over all hospital tests and procedure/radiological results at the follow up. Please ask your Primary MD to get all Hospital records sent to his/her office.  If you experience worsening of your admission symptoms, develop shortness of breath, life threatening emergency, suicidal or homicidal thoughts you must seek medical attention immediately by calling 911 or calling your MD immediately  if symptoms less severe.  You must read complete instructions/literature along with all the possible adverse reactions/side effects for all the Medicines you take and that have been prescribed to you. Take any new Medicines after you have completely understood and accept all the possible adverse reactions/side effects.   Do not drive when taking pain medications.   Do not take more than prescribed Pain, Sleep and Anxiety Medications  Special Instructions: If you have smoked or chewed Tobacco  in the last 2 yrs please stop smoking, stop any regular Alcohol  and or any Recreational drug use.  Wear Seat belts while driving.  Please note  You were cared for by a hospitalist during your hospital stay. Once  you are discharged, your primary care physician will handle any further medical issues. Please note that NO REFILLS for any discharge medications will be authorized once you are discharged, as it is imperative that you return to your primary care physician (or establish a relationship with a primary care physician if you do not have one) for your  aftercare needs so that they can reassess your need for medications and monitor your lab values.   The results of significant diagnostics from this hospitalization (including imaging, microbiology, ancillary and laboratory) are listed below for reference.      Procedures/Studies:  CT ABDOMEN PELVIS WO CONTRAST  Result Date: 01/08/2021 CLINICAL DATA:  Abdominal pain and diarrhea EXAM: CT ABDOMEN AND PELVIS WITHOUT CONTRAST TECHNIQUE: Multidetector CT imaging of the abdomen and pelvis was performed following the standard protocol without IV contrast. COMPARISON:  Chest x-ray from earlier in the same day. FINDINGS: Lower chest: Mild emphysematous changes are noted. Mild bibasilar atelectatic changes are seen. Hepatobiliary: Gallbladder is well distended without evidence of cholelithiasis. No inflammatory changes are seen. The liver is unremarkable. Pancreas: Pancreas is atrophic but otherwise within normal limits. Spleen: Scattered calcified granulomas are noted in the spleen. Adrenals/Urinary Tract: Adrenal glands are within normal limits. Nonobstructing left renal calculi are noted measuring 1-2 mm. No ureteral stones are seen. The bladder is partially distended. Stomach/Bowel: Diffuse diverticulosis of the sigmoid colon is noted. Wall thickening is noted consistent with smooth muscle hypertrophy. Minimal pericolonic inflammatory changes are seen without abscess or perforation. No obstructive changes are noted. The appendix is within normal limits. No inflammatory changes are seen. The small bowel and stomach are unremarkable. Vascular/Lymphatic: Atherosclerotic  calcifications of the aorta are noted. Stent graft is noted in place. Aneurysm sac again measures 4 cm but stable. No significant lymphadenopathy is noted. Reproductive: Status post hysterectomy. No adnexal masses. Other: No abdominal wall hernia or abnormality. No abdominopelvic ascites. Musculoskeletal: Postoperative and degenerative changes of lumbar spine are noted. IMPRESSION: Mild bibasilar atelectatic changes. Changes of mild diverticulitis in the sigmoid colon. No abscess or perforation is noted. Of prior aortic stent graft therapy. 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 Electronically Signed   By: Inez Catalina M.D.   On: 01/08/2021 19:40   CT HEAD WO CONTRAST  Result Date: 01/06/2021 CLINICAL DATA:  Neuro deficit, acute, stroke suspected Mental status change, unknown cause Patient reports dizziness and speech issues for 4 days. EXAM: CT HEAD WITHOUT CONTRAST TECHNIQUE: Contiguous axial images were obtained from the base of the skull through the vertex without intravenous contrast. COMPARISON:  Brain MRI 09/30/2020 FINDINGS: Brain: Brain volume is normal for age. No intracranial hemorrhage, mass effect, or midline shift. No hydrocephalus. The basilar cisterns are patent. 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 No evidence of territorial infarct or acute ischemia. No extra-axial or intracranial fluid collection. Vascular: Atherosclerosis of skullbase vasculature without hyperdense vessel or abnormal calcification. Skull: No fracture or focal lesion. Sinuses/Orbits: Chronic opacification of lower mastoid air cells. Paranasal sinuses are clear. Bilateral cataract resection. Other: None. IMPRESSION: No acute intracranial abnormality. Electronically Signed   By: Keith Rake M.D.   On: 01/06/2021 20:36   MR BRAIN WO CONTRAST  Result Date: 01/07/2021 CLINICAL DATA:  Encephalopathy EXAM: MRI HEAD WITHOUT CONTRAST TECHNIQUE: Multiplanar, multiecho pulse sequences of the brain and surrounding structures  were obtained without intravenous contrast. COMPARISON:  None. FINDINGS: Brain: No acute infarct, mass effect or extra-axial collection. Focus of chronic microhemorrhage in the left frontal lobe. No acute hemorrhage. There is multifocal hyperintense T2-weighted signal within the white matter. Parenchymal volume and CSF spaces are normal. Incidentally noted pericallosal lipoma, unchanged. Vascular: Major flow voids are preserved. Skull and upper cervical spine: Normal calvarium and skull base. Visualized upper cervical spine and soft tissues are normal. Sinuses/Orbits:No paranasal sinus fluid levels or advanced mucosal thickening. No mastoid  or middle ear effusion. Normal orbits. IMPRESSION: 1. No acute intracranial abnormality. 2. 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 Electronically Signed   By: Ulyses Jarred M.D.   On: 01/07/2021 22:22   DG CHEST PORT 1 VIEW  Result Date: 01/10/2021 CLINICAL DATA:  Shortness of breath EXAM: PORTABLE CHEST 1 VIEW COMPARISON:  January 08, 2021 FINDINGS: The cardiomediastinal silhouette is unchanged in contour.Atherosclerotic calcifications of the aorta. Status post median sternotomy. Small LEFT pleural effusion. No pneumothorax. Bibasilar linear opacities, likely atelectasis. Visualized abdomen is unremarkable. Multilevel degenerative changes of the thoracic spine. IMPRESSION: Small LEFT pleural effusion and bibasilar atelectasis. Electronically Signed   By: Valentino Saxon MD   On: 01/10/2021 13:46   DG CHEST PORT 1 VIEW  Result Date: 01/08/2021 CLINICAL DATA:  Hypoxia. EXAM: PORTABLE CHEST 1 VIEW COMPARISON:  Chest x-ray 07/27/2021 FINDINGS: The cardiac silhouette, mediastinal and hilar contours are within normal limits and stable given the AP projection and portable technique. 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 Low lung volumes with vascular crowding and streaky atelectasis. No infiltrates or  effusions. No pulmonary edema. Mild to moderate eventration of the right hemidiaphragm. The bony thorax is intact. IMPRESSION: Low lung volumes with vascular crowding and streaky atelectasis. Electronically Signed   By: Marijo Sanes M.D.   On: 01/08/2021 15:47   DG Chest Port 1 View  Result Date: 01/07/2021 CLINICAL DATA:  Hiccups and hypoxia EXAM: PORTABLE CHEST 1 VIEW COMPARISON:  06/16/2020 FINDINGS: Patient rotated right. Prior median sternotomy. Midline trachea. Mild cardiomegaly. Mitral annular calcifications. Atherosclerosis in the transverse aorta. No pleural effusion or pneumothorax. Basilar predominant interstitial thickening is similar and may relate to chronic bronchitis or prior smoking. No lobar consolidation. IMPRESSION: No acute cardiopulmonary disease. Aortic Atherosclerosis (ICD10-I70.0). Electronically Signed   By: Abigail Miyamoto M.D.   On: 01/07/2021 15:55   DG ABD ACUTE 2+V W 1V CHEST  Result Date: 01/06/2021 CLINICAL DATA:  Diarrhea. EXAM: DG ABDOMEN ACUTE WITH 1 VIEW CHEST COMPARISON:  Chest x-ray dated June 16, 2020. FINDINGS: There is no evidence of dilated bowel loops or free intraperitoneal air. No radiopaque calculi or other significant radiographic abnormality is seen. Prior EVAR and lumbar fusion. Heart size and mediastinal contours are within normal limits. Atherosclerotic calcification of the aortic arch. Prior CABG. Both lungs are clear. IMPRESSION: Negative abdominal radiographs.  No acute cardiopulmonary disease. Electronically Signed   By: Titus Dubin M.D.   On: 01/06/2021 12:19   ECHOCARDIOGRAM COMPLETE  Result Date: 01/09/2021    ECHOCARDIOGRAM REPORT   Patient Name:   ABBEYGAIL IGOE [facility] Date of Exam: 01/09/2021 Medical Rec #:  [id number]           Height:       60.0 in Accession #:    [id number]          Weight:       135.0 lb Date of Birth:  1946/10/13           BSA:          1.579 m Patient Age:    74 years            BP:           114/42 mmHg Patient Gender: F                    HR:           74 bpm. Exam Location:  Inpatient Procedure:  2D Echo, Color Doppler and Cardiac Doppler Indications:    R01.1 Murmur  History:        Patient has prior history of Echocardiogram examinations, most                 recent 11/16/2014. Prior CABG, COPD; Risk Factors:Hypertension,                 Diabetes and Dyslipidemia.  Sonographer:    Raquel Sarna Senior RDCS Referring Phys: [place]  1. Left ventricular ejection fraction, by estimation, is 60 to 65%. The left ventricle has normal function. The left ventricle has no regional wall motion abnormalities. There is mild asymmetric left ventricular hypertrophy of the basal-septal segment. Left ventricular diastolic parameters are consistent with Grade I diastolic dysfunction (impaired relaxation).  2. Right ventricular systolic function is normal. The right ventricular size is normal. There is normal pulmonary artery systolic pressure.  3. Left atrial size was severely dilated.  4. The mitral valve is normal in structure. Mild mitral valve regurgitation. No evidence of mitral stenosis. The mean mitral valve gradient is 4.0 mmHg. Moderate mitral annular calcification.  5. Tricuspid valve regurgitation is moderate.  6. The aortic valve is normal in structure. Aortic valve regurgitation is not visualized. No aortic stenosis is present.  7. The inferior vena cava is normal in size with greater than 50% respiratory variability, suggesting right atrial pressure of 3 mmHg. FINDINGS  Left Ventricle: Left ventricular ejection fraction, by estimation, is 60 to 65%. The left ventricle has normal function. The left ventricle has no regional wall motion abnormalities. The left ventricular internal cavity size was normal in size. There is  mild asymmetric left ventricular hypertrophy of the basal-septal segment. Left ventricular diastolic parameters are consistent with Grade I diastolic dysfunction (impaired relaxation). Right Ventricle: The  right ventricular size is normal. No increase in right ventricular wall thickness. Right ventricular systolic function is normal. There is normal pulmonary artery systolic pressure. The tricuspid regurgitant velocity is 2.84 m/s, and  with an assumed right atrial pressure of 3 mmHg, the estimated right ventricular systolic pressure is 64.4 mmHg. Left Atrium: Left atrial size was severely dilated. Right Atrium: Right atrial size was normal in size. Pericardium: There is no evidence of pericardial effusion. Mitral Valve: The mitral valve is normal in structure. There is mild thickening of the mitral valve leaflet(s). Moderate mitral annular calcification. Mild mitral valve regurgitation. No evidence of mitral valve stenosis. MV peak gradient, 7.8 mmHg. The mean mitral valve gradient is 4.0 mmHg. Tricuspid Valve: The tricuspid valve is normal in structure. Tricuspid valve regurgitation is moderate . No evidence of tricuspid stenosis. Aortic Valve: The aortic valve is normal in structure. Aortic valve regurgitation is not visualized. No aortic stenosis is present. Pulmonic Valve: The pulmonic valve was normal in structure. Pulmonic valve regurgitation is mild. No evidence of pulmonic stenosis. Aorta: The aortic root is normal in size and structure. Venous: The inferior vena cava is normal in size with greater than 50% respiratory variability, suggesting right atrial pressure of 3 mmHg. IAS/Shunts: No atrial level shunt detected by color flow Doppler.  LEFT VENTRICLE PLAX 2D LVIDd:         4.00 cm  Diastology LVIDs:         2.50 cm  LV e' medial:    6.74 cm/s LV PW:         1.10 cm  LV E/e' medial:  13.9 LV IVS:  1.30 cm  LV e' lateral:   9.90 cm/s LVOT diam:     1.90 cm  LV E/e' lateral: 9.5 LV SV:         90 LV SV Index:   57 LVOT Area:     2.84 cm  RIGHT VENTRICLE RV S prime:     8.70 cm/s TAPSE (M-mode): 1.9 cm LEFT ATRIUM             Index       RIGHT ATRIUM           Index LA diam:        4.60 cm 2.91 cm/m   RA Area:     15.10 cm LA Vol (A2C):   89.4 ml 56.60 ml/m RA Volume:   37.80 ml  23.93 ml/m LA Vol (A4C):   75.2 ml 47.61 ml/m LA Biplane Vol: 86.0 ml 54.45 ml/m  AORTIC VALVE LVOT Vmax:   142.00 cm/s LVOT Vmean:  93.100 cm/s LVOT VTI:    0.317 m  AORTA Ao Root diam: 2.70 cm Ao Asc diam:  3.10 cm MITRAL VALVE                TRICUSPID VALVE MV Area (PHT): 2.66 cm     TR Peak grad:   32.3 mmHg MV Area VTI:   2.25 cm     TR Vmax:        284.00 cm/s MV Peak grad:  7.8 mmHg MV Mean grad:  4.0 mmHg     SHUNTS MV Vmax:       1.40 m/s     Systemic VTI:  0.32 m MV Vmean:      98.3 cm/s    Systemic Diam: 1.90 cm MV Decel Time: 285 msec MV E velocity: 93.80 cm/s MV A velocity: 123.00 cm/s MV E/A ratio:  0.76 Candee Furbish MD Electronically signed by Candee Furbish MD Signature Date/Time: 01/09/2021/12:31:31 PM    Final        LAB RESULTS: Basic Metabolic Panel: Recent Labs  Lab 01/12/21 0046 01/13/21 0059  NA 135 137  K 3.2* 2.9*  CL 102 99  CO2 24 27  GLUCOSE 120* 120*  BUN 7* 6*  CREATININE 0.81 0.85  CALCIUM 8.3* 8.7*  MG 1.4* 1.7   Liver Function Tests: No results for input(s): AST, ALT, ALKPHOS, BILITOT, PROT, ALBUMIN in the last 168 hours. No results for input(s): LIPASE, AMYLASE in the last 168 hours. No results for input(s): AMMONIA in the last 168 hours. CBC: Recent Labs  Lab 01/10/21 0056 01/11/21 0156  WBC 13.1* 11.9*  NEUTROABS  --  9.8*  HGB 11.2* 11.5*  HCT 34.9* 33.3*  MCV 88.1 86.0  PLT 280 265   Cardiac Enzymes: Recent Labs  Lab 01/07/21 0220  CKTOTAL 68   BNP: Invalid input(s): POCBNP CBG: Recent Labs  Lab 01/12/21 1113 01/12/21 1545  GLUCAP 122* 127*       Disposition and Follow-up: Discharge Instructions    Diet - low sodium heart healthy   Complete by: As directed    Increase activity slowly   Complete by: As directed        DISPOSITION: Home  DISCHARGE FOLLOW-UP  Follow-up Information    Ladene Artist, MD. Schedule an appointment as  soon as possible for a visit in 2 week(s).   Specialty: Gastroenterology Contact information: [address] [address] [phone]        [name], MD. Schedule an  appointment as soon as possible for a visit in 2 week(s).   Specialty: Family Medicine Contact information: [address] [place] [address] [phone]        Martinique, Peter M, MD .   Specialty: Cardiology Contact information: [address] [place] [address] [phone]        [facility] Follow up.   Why: rolling walker Contact information: [place] [address] [phone]        Care, [facility] Follow up.   Why: HHPT Contact information: [place] [address] [phone]                Time coordinating discharge:  35 minutes  Signed:   Estill Cotta M.D. [facility] 01/13/2021, 11:53 AM

## 2021-01-13 NOTE — Progress Notes (Signed)
Pt is refusing her K+ IV and PO. Educated about the risks of low K+. The pt wants the K+ pills that she brought from home and were sent to the pharmacy so they could be labeled for administration.

## 2021-01-13 NOTE — TOC Transition Note (Signed)
Transition of Care [facility]) - CM/SW Discharge Note   Patient Details  Name: Megan [name] MRN: [id number] Date of Birth: 12/18/1945  [facility]) CM/SW Contact:  Zenon Mayo, RN Phone Number: 01/13/2021, 11:41 AM   Clinical Narrative:    Patient is for dc today, NCM made referral to [facility] with [facility], she is able to take referral for HHPT. Soc will begin 24 to 48 hrs post dc. [employer] brought rollator to patient room prior to dc.  Final next level of care: [place] Barriers to Discharge: No Barriers Identified   Patient Goals and CMS Choice Patient states their goals for this hospitalization and ongoing recovery are:: get better CMS Medicare.gov Compare Post Acute Care list provided to:: Patient Choice offered to / list presented to : Patient  Discharge Placement                       Discharge Plan and Services                DME Arranged: Walker rolling with seat DME Agency: [employer] Date DME Agency Contacted: 01/13/21 Time DME Agency Contacted: [id number] Representative spoke with at DME Agency: [place]: PT [place]: [place] Date [place]: 01/13/21 Time [place]: 1139 Representative spoke with at [place]: [place] Determinants of Health ([place]) Interventions     Readmission Risk Interventions No flowsheet data found.

## 2021-01-13 NOTE — Plan of Care (Signed)

## 2021-01-13 NOTE — TOC Progression Note (Addendum)
Transition of Care [facility]) - Progression Note    Patient Details  Name: Megan [name] MRN: [id number] Date of Birth: 1946/11/18  [facility]) CM/SW Contact  Zenon Mayo, RN Phone Number: 01/13/2021, 9:53 AM  Clinical Narrative:    NCM spoke with patient, offered choice  For [facility] services, she states she only wants HHPT,  And she would like [facility] since they were working with her spouse.  NCM made referral to [place] with [facility].  Awaiting call back.  Patient will aslo need a rollator. NCM made referral to [facility] with [facility], the rollator will be delivered to room prior to dc. Patient states is [facility] can not take referral she does not have a preference.        Expected Discharge Plan and Services           Expected Discharge Date: 01/13/21                                     Social Determinants of Health (SDOH) Interventions    Readmission Risk Interventions No flowsheet data found.

## 2021-01-13 NOTE — Progress Notes (Signed)
Physical Therapy Treatment Patient Details Name: Megan [name] MRN: [id number] DOB: [birth year] Today's Date: 01/13/2021    History of Present Illness 75 year old female with HTN, HLD, CAD/PAD, COPD, T2DM, history of back surgery and mildly issues with balance since surgeryn and walks with cane at baseline, admitted 01/06/21 with diarrhea-nonbloody and generalized weakness x4 days prior to admission.Found to have UTI but her diarrhea has continued so work up in progress.  Patient also had episode of fluid overload/acute respiratory failure.    PT Comments    Pt is looking forward to discharge home today. Focus of session gait training with Rollator and stair training.  Pt min guard for transfers, ambulation with Rollator and ascent/descent of 4 steps. Pt reports feeling good walking with Rollator and PT reinforced use in her home for safety. D/c plans remain appropriate.   Follow Up Recommendations  Home health PT;Supervision - Intermittent     Equipment Recommendations  Rolling walker with 5" wheels (due to pt stature requires smallest, lightest weight Rollator available)       Precautions / Restrictions Precautions Precautions: Fall Restrictions Weight Bearing Restrictions: No    Mobility  Bed Mobility               General bed mobility comments: In chair on arrival and asking to walk to the bathroom due to diarrhea.  Transfers Overall transfer level: Needs assistance Equipment used: None Transfers: Sit to/from Stand Sit to Stand: Min guard         General transfer comment: able to power up and self steady without UE support  Ambulation/Gait Ambulation/Gait assistance: Min guard Gait Distance (Feet): 650 Feet Assistive device: 4-wheeled walker;Straight cane Gait Pattern/deviations: Step-through pattern;Decreased stride length;Drifts right/left Gait velocity: WFL Gait velocity interpretation: <1.8 ft/sec, indicate of risk for recurrent falls General Gait  Details: min guard for safety, slow, steady gait with slight lateral sway.better endurance today ambulated 250 feet requested to go to bathroom and afterward was able to ambulate another 400 feet with minor SoB by end of ambulation   Stairs Stairs: Yes Stairs assistance: Min guard Stair Management: One rail Left;Forwards;Step to pattern;Alternating pattern Number of Stairs: 4 (4x2) General stair comments: min guard for safety, strong ascent, descent with step to pattern, on second attempt tried step over step with much more effort, encouraged pt to utilize step to pattern until strength improves         Balance Overall balance assessment: Needs assistance Sitting-balance support: No upper extremity supported;Feet supported Sitting balance-Leahy Scale: Fair     Standing balance support: Bilateral upper extremity supported;During functional activity;No upper extremity supported Standing balance-Leahy Scale: Fair Standing balance comment: can stand statically without UE support, however needs at least 1 UE support dynamically                            Cognition Arousal/Alertness: Awake/alert Behavior During Therapy: WFL for tasks assessed/performed Overall Cognitive Status: Within Functional Limits for tasks assessed                                           General Comments General comments (skin integrity, edema, etc.): VSS on RA      Pertinent Vitals/Pain Pain Assessment: No/denies pain           PT Goals (current goals can now be found in the care  plan section) Acute Rehab PT Goals Patient Stated Goal: to gohome PT Goal Formulation: With patient Time For Goal Achievement: 01/24/21 Potential to Achieve Goals: Good Progress towards PT goals: Progressing toward goals    Frequency    Min 3X/week      PT Plan Current plan remains appropriate       AM-PAC PT "6 Clicks" Mobility   Outcome Measure  Help needed turning from your  back to your side while in a flat bed without using bedrails?: A Little Help needed moving from lying on your back to sitting on the side of a flat bed without using bedrails?: A Little Help needed moving to and from a bed to a chair (including a wheelchair)?: A Little Help needed standing up from a chair using your arms (e.g., wheelchair or bedside chair)?: A Little Help needed to walk in hospital room?: A Little Help needed climbing 3-5 steps with a railing? : A Little 6 Click Score: 18    End of Session Equipment Utilized During Treatment: Gait belt Activity Tolerance: Patient tolerated treatment well Patient left: in chair;with call bell/phone within reach;with chair alarm set;with family/visitor present Nurse Communication: Mobility status PT Visit Diagnosis: Muscle weakness (generalized) (M62.81)     Time: 2449-7530 PT Time Calculation (min) (ACUTE ONLY): 26 min  Charges:  $Gait Training: 23-37 mins                     Theone Bowell B. Migdalia Dk PT, DPT Acute Rehabilitation Services Pager [phone] Office [phone]    [place] 01/13/2021, 12:22 PM

## 2021-01-15 ENCOUNTER — Telehealth: Payer: Self-pay

## 2021-01-15 LAB — CULTURE, BLOOD (ROUTINE X 2)
Culture: NO GROWTH
Culture: NO GROWTH
Special Requests: ADEQUATE

## 2021-01-15 NOTE — Telephone Encounter (Cosign Needed)
Transition Care Management Follow-up Telephone Call  Date of discharge and from where: [place] [place] 01/13/21    How have you been since you were released from the hospital? Not to good no pain   Any questions or concerns? No  Items Reviewed:  Did the pt receive and understand the discharge instructions provided? Yes   Medications obtained and verified? Yes   Other? No   Any new allergies since your discharge? No   Dietary orders reviewed? Yes  Do you have support at home? Yes   Home Care and Equipment/Supplies: Were home health services ordered? yes If so, what is the name of the agency?   Has the agency set up a time to come to the patient's home? no pt is waiting for call  From OT PT home health, I encouraged to call them if no call by tomorow Were any new equipment or medical supplies ordered?  Yes: rolling walker What is the name of the medical supply agency?  Were you able to get the supplies/equipment? yes Do you have any questions related to the use of the equipment or supplies? No  Functional Questionnaire: (I = Independent and D = Dependent) ADLs: I  Bathing/Dressing- I  Meal Prep- I  Eating- I  Maintaining continence- I  Transferring/Ambulation- I  Managing Meds- I  Follow up appointments reviewed:   PCP Hospital f/u appt confirmed? Yes  Scheduled to see Dr Jonni Sanger on 02/23/21 @ 3:30  [facility] f/u appt confirmed? Yes  Scheduled to see Dr Martinique  on 02/02/21 @ 8:20.  Are transportation arrangements needed? No   If their condition worsens, is the pt aware to call PCP or go to the Emergency Dept.? Yes  Was the patient provided with contact information for the PCP's office or ED? Yes  Was to pt encouraged to call back with questions or concerns? Yes

## 2021-01-15 NOTE — Telephone Encounter (Signed)
Patient is calling stating that she called [facility] and the are unable to take referral due to staffing issues. Can we send referral to another home health agency

## 2021-01-16 NOTE — Telephone Encounter (Signed)
Patient is calling in asking if Dr.Andy can get her in today so she is not going in the weekend with the diarrhea.

## 2021-01-17 ENCOUNTER — Other Ambulatory Visit: Payer: Self-pay | Admitting: Family Medicine

## 2021-01-19 ENCOUNTER — Other Ambulatory Visit: Payer: Self-pay

## 2021-01-19 ENCOUNTER — Encounter: Payer: Self-pay | Admitting: Family Medicine

## 2021-01-19 ENCOUNTER — Ambulatory Visit (INDEPENDENT_AMBULATORY_CARE_PROVIDER_SITE_OTHER): Payer: Medicare Other | Admitting: Family Medicine

## 2021-01-19 VITALS — BP 138/64 | HR 88 | Temp 97.3°F | Resp 16 | Wt 131.2 lb

## 2021-01-19 DIAGNOSIS — N179 Acute kidney failure, unspecified: Secondary | ICD-10-CM

## 2021-01-19 DIAGNOSIS — R7301 Impaired fasting glucose: Secondary | ICD-10-CM | POA: Diagnosis not present

## 2021-01-19 DIAGNOSIS — I10 Essential (primary) hypertension: Secondary | ICD-10-CM | POA: Diagnosis not present

## 2021-01-19 DIAGNOSIS — K5792 Diverticulitis of intestine, part unspecified, without perforation or abscess without bleeding: Secondary | ICD-10-CM

## 2021-01-19 DIAGNOSIS — N3000 Acute cystitis without hematuria: Secondary | ICD-10-CM | POA: Diagnosis not present

## 2021-01-19 DIAGNOSIS — B370 Candidal stomatitis: Secondary | ICD-10-CM

## 2021-01-19 DIAGNOSIS — E871 Hypo-osmolality and hyponatremia: Secondary | ICD-10-CM | POA: Diagnosis not present

## 2021-01-19 LAB — POCT GLYCOSYLATED HEMOGLOBIN (HGB A1C): Hemoglobin A1C: 5.9 % — AB (ref 4.0–5.6)

## 2021-01-19 MED ORDER — NYSTATIN 100000 UNIT/ML MT SUSP: 5.0000 mL | Freq: Four times a day (QID) | OROMUCOSAL | 0 refills | Status: DC

## 2021-01-19 NOTE — Telephone Encounter (Signed)
We will discuss everything at appointment today.

## 2021-01-19 NOTE — Progress Notes (Signed)
Subjective  CC:  Chief Complaint  Patient presents with  . Hospitalization Follow-up  . Diarrhea    HPI: Megan [name] is a 75 y.o. female who presents to the office today to address the problems listed above in the chief complaint.  75 year old with multiple medical problems including CAD, HLD, hypertension and impaired fasting glucose is here for hospital follow-up.  She was discharged on February 8 after a 7-day hospitalization.  I reviewed all hospital notes, encounters, imaging studies and lab work.  To briefly summarize, she presented due to a several day history of watery diarrhea and weakness.  On presentation, she was hypotensive, and found to be in acute kidney failure.  Her hospital course was significant for delirium thought related to metabolic encephalopathy.  Work-up found hyponatremia, hypokalemia, mild diverticulitis by abdominal CT, acute cystitis and acute kidney injury.  With hydration kidney function improved, however she did have some pulmonary edema treated with Lasix.  She was hypotensive blood pressure medicines were held until discharge.  She is now back on them and doing better.  She also had a gait abnormality and was found to be weak.  She is undergoing work-up for chronic back pain and lower extremity gait abnormality.  She has a myelogram scheduled with orthopedics.  Since hospital discharge, she remains very fatigued and needs a cane to ambulate.  Her mental status is much improved.  She continues to have loose watery stools.  She has follow-up with GI but they were not able to schedule an appointment till the end of March.  She denies blood in the stool.  C. difficile and GI pathogen panels were completely negative.  She is continue on antibiotics.  She does have oral thrush and needs refill nystatin for this.  Unfortunately, her husband has progressive dementia and she knows that she will need to move to [place] in the near future to be closer to family  members who can help.  Her mood is appropriately stressed.   Assessment  1. Diverticulitis   2. Acute kidney injury ([place])   3. IFG (impaired fasting glucose)   4. Hyponatremia   5. Acute cystitis without hematuria   6. Oral thrush   7. Essential hypertension      Plan   Diverticulitis: Mild by CT scan.  Continue to have diarrhea.  She will complete her antibiotics.  I recommend follow-up with GI.  She may warrant another colonoscopy to rule out other causes of diarrhea.  We will need to avoid further dehydration.  She is drinking well at this time.  She is on Lasix due to history of CHF.  Blood pressure stable today.  History of acute injury: Recheck today to ensure stability.  Impaired fasting glucose, recheck.  Recheck electrolytes.  We will recheck urine culture given history of cystitis.  This could have been contributing to her mental status changes as well.  Mental status changes: Metabolic encephalopathy seems cleared.  Will monitor for cognitive impairment.  Oral thrush: Nystatin refilled.  Follow up: No follow-ups on file.  06/19/2021  Orders Placed This Encounter  Procedures  . Urine Culture  . Basic metabolic panel  . CBC with Differential/Platelet  . POC HgB A1c   Meds ordered this encounter  Medications  . nystatin (MYCOSTATIN) 100000 UNIT/ML suspension    Sig: Take 5 mLs (500,000 Units total) by mouth 4 (four) times daily.    Dispense:  80 mL    Refill:  0  I reviewed the patients updated PMH, FH, and SocHx.    Patient Active Problem List   Diagnosis Date Noted  . COPD with chronic bronchitis ([place]) 09/28/2018    Priority: High  . Essential hypertension 07/10/2018    Priority: High  . IFG (impaired fasting glucose) 11/10/2017    Priority: High  . 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 Coronary artery disease     Priority: High  . Peripheral vascular disease with claudication ([place])     Priority: High  . History of abdominal  aortic aneurysm repair 05/18/2011    Priority: High  . Mixed hyperlipidemia     Priority: High  . DJD (degenerative joint disease), lumbosacral 11/10/2017    Priority: Medium  . Fibromyalgia 11/10/2017    Priority: Medium  . GERD (gastroesophageal reflux disease) 11/10/2017    Priority: Medium  . Fatty liver 11/10/2017    Priority: Medium  . 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    Priority: Medium  . History of renal artery stenosis 05/18/2011    Priority: Medium  . Dyshidrotic eczema 11/10/2017    Priority: Low  . Osteopenia 11/10/2017    Priority: Low  . Does use hearing aid 11/10/2017    Priority: Low  . Diarrhea 01/06/2021   Current Meds  Medication Sig  . Accu-Chek FastClix Lancets MISC Check blood sugar once daily PRN  . albuterol (PROVENTIL HFA;VENTOLIN HFA) 108 (90 Base) MCG/ACT inhaler Inhale 2 puffs into the lungs every 4 (four) hours as needed for wheezing or shortness of breath.  . ALPRAZolam (XANAX) 0.25 MG tablet Take 1 tablet (0.25 mg total) by mouth at bedtime as needed for sleep.  [name] amLODipine (NORVASC) 5 MG tablet TAKE 1 TABLET BY MOUTH EVERY DAY (Patient taking differently: Take 5 mg by mouth daily.)  . aspirin EC 81 MG tablet Take 1 tablet (81 mg total) by mouth daily.  [name] atorvastatin (LIPITOR) 80 MG tablet TAKE 1 TABLET BY MOUTH EVERY DAY (Patient taking differently: Take 80 mg by mouth daily.)  . augmented betamethasone dipropionate (DIPROLENE-AF) 0.05 % cream Apply 1 application topically daily as needed (rash). For hands  . B Complex-Biotin-FA (B-100 COMPLEX PO) Take 1 tablet by mouth at bedtime.   . Budeson-Glycopyrrol-Formoterol (BREZTRI AEROSPHERE) 160-9-4.8 MCG/ACT AERO Inhale 2 puffs into the lungs in the morning and at bedtime. (Patient taking differently: Inhale 1 puff into the lungs daily as needed (shortness og breath).)  . ciclopirox (LOPROX) 0.77 % cream Apply 1 application topically daily as needed (hands fungus).   . ciprofloxacin (CIPRO) 500 MG  tablet Take 1 tablet (500 mg total) by mouth 2 (two) times daily for 7 days.  . CORDRAN 4 MCG/SQCM TAPE Apply 1 each topically daily as needed (Rash).   . cycloSPORINE (RESTASIS) 0.05 % ophthalmic emulsion Place 1 drop into both eyes 2 (two) times daily.   [name] gabapentin (NEURONTIN) 300 MG capsule Take 1-2 capsules (300-600 mg total) by mouth 3 (three) times daily. (Patient taking differently: Take 300 mg by mouth 2 (two) times daily.)  . glucose blood (ACCU-CHEK SMARTVIEW) test strip CHECK BLOOD SUGAR ONCE DAILY AS NEEDED  . hydrALAZINE (APRESOLINE) 10 MG tablet TAKE 1 TABLET BY MOUTH THREE TIMES A DAY (Patient taking differently: Take 10 mg by mouth 2 (two) times daily.)  . loperamide (IMODIUM) 2 MG capsule Take 1 capsule (2 mg total) by mouth every 6 (six) hours as needed for diarrhea or loose stools. Also available OTC  . losartan-hydrochlorothiazide (HYZAAR) 100-25  MG tablet Take 1 tablet by mouth daily.  . meloxicam (MOBIC) 15 MG tablet Take 1 tablet (15 mg total) by mouth daily.  . metroNIDAZOLE (FLAGYL) 500 MG tablet Take 1 tablet (500 mg total) by mouth 3 (three) times daily for 7 days.  . Multiple Vitamins-Minerals (MULTIVITAMIN WITH MINERALS) tablet Take 1 tablet by mouth at bedtime.  . nitroGLYCERIN (NITROSTAT) 0.4 MG SL tablet Place 1 tablet (0.4 mg total) under the tongue every 5 (five) minutes as needed for chest pain. (Patient taking differently: Place 0.4 mg under the tongue every 5 (five) minutes as needed for chest pain (max 3 doses).)  . omeprazole (PRILOSEC) 40 MG capsule TAKE 1 CAPSULE BY MOUTH TWICE A DAY--NEED APPT FOR FURTHER REFILLS (Patient taking differently: Take 40 mg by mouth 2 (two) times daily.)  . ondansetron (ZOFRAN) 8 MG tablet Take by mouth every 8 (eight) hours as needed for nausea or vomiting.  [name] oxyCODONE-acetaminophen (PERCOCET) 10-325 MG tablet Take 1 tablet by mouth every 6 (six) hours as needed for pain. (Patient taking differently: Take 1 tablet by mouth daily  as needed for pain.)  . potassium chloride (KLOR-CON) 10 MEQ tablet Take 1 tablet (10 mEq total) by mouth 2 (two) times daily.  [name] saccharomyces boulardii (FLORASTOR) 250 MG capsule Take 1 capsule (250 mg total) by mouth 2 (two) times daily. Any probiotic available is okay  . simethicone (MYLICON) 80 MG chewable tablet Chew 1 tablet (80 mg total) by mouth 4 (four) times daily as needed for flatulence (bloating). also available OTC  . torsemide (DEMADEX) 20 MG tablet Take 0.5 tablets (10 mg total) by mouth daily as needed (fluid).  . tretinoin (RETIN-A) 0.1 % cream Apply 1 application topically every other day. In the evening  . VITAMIN D, ERGOCALCIFEROL, PO Take 1,000 Units by mouth every evening.   . [DISCONTINUED] nystatin (MYCOSTATIN) 100000 UNIT/ML suspension TAKE 5 MLS (500,000 UNITS TOTAL) BY MOUTH 4 (FOUR) TIMES DAILY FOR 10 DAYS.    Allergies: Patient is allergic to anticoagulant compound, bextra [valdecoxib], hydrocod polst-cpm polst er, lipitor [atorvastatin], mevacor [lovastatin], plavix [clopidogrel bisulfate], zocor [simvastatin], doxycycline, metoprolol, morphine and related, codeine, and lisinopril. Family History: Patient family history includes AAA (abdominal aortic aneurysm) in her brother; Breast cancer in her sister; Colon cancer in her cousin and paternal grandmother; Diabetes in her brother and sister; Heart attack in her brother, father, mother, and sister; Heart disease in her brother, brother, father, mother, and sister; Hyperlipidemia in her brother and sister; Hypertension in her brother and sister; Liver cancer in her sister. Social History:  Patient  reports that she quit smoking about 25 years ago. Her smoking use included cigarettes. She smoked 0.00 packs per day for 37.00 years. She has never used smokeless tobacco. She reports current alcohol use of about 6.0 - 10.0 standard drinks of alcohol per week. She reports that she does not use drugs.  Review of  Systems: Constitutional: Negative for fever malaise or anorexia Cardiovascular: negative for chest pain Respiratory: negative for SOB or persistent cough Gastrointestinal: negative for abdominal pain  Objective  Vitals: BP 138/64   Pulse 88   Temp (!) 97.3 F (36.3 C)   Resp 16   Wt 131 lb 3.2 oz (59.5 kg)   SpO2 92%   BMI 25.62 kg/m  General: no acute distress , A&Ox3, appears fatigued, affect flattened HEENT: PEERL, conjunctiva normal, neck is supple Cardiovascular:  RRR with murmur, no gallop, no edema Respiratory:  Good breath sounds bilaterally,  CTAB with normal respiratory effort, no rales Skin:  Warm, no rashes     Commons side effects, risks, benefits, and alternatives for medications and treatment plan prescribed today were discussed, and the patient expressed understanding of the given instructions. Patient is instructed to call or message via MyChart if he/she has any questions or concerns regarding our treatment plan. No barriers to understanding were identified. We discussed Red Flag symptoms and signs in detail. Patient expressed understanding regarding what to do in case of urgent or emergency type symptoms.   Medication list was reconciled, printed and provided to the patient in AVS. Patient instructions and summary information was reviewed with the patient as documented in the AVS. This note was prepared with assistance of Dragon voice recognition software. Occasional wrong-word or sound-a-like substitutions may have occurred due to the inherent limitations of voice recognition software  This visit occurred during the SARS-CoV-2 public health emergency.  Safety protocols were in place, including screening questions prior to the visit, additional usage of staff PPE, and extensive cleaning of exam room while observing appropriate contact time as indicated for disinfecting solutions.

## 2021-01-19 NOTE — Patient Instructions (Signed)
Please return in 3 months for recheck.   I will release your lab results to you on your MyChart account with further instructions. Please reply with any questions.   If you have any questions or concerns, please don't hesitate to send me a message via MyChart or call the office at [phone]. Thank you for visiting with [place] today! It's our pleasure caring for you.

## 2021-01-20 ENCOUNTER — Encounter: Payer: Self-pay | Admitting: Family Medicine

## 2021-01-20 DIAGNOSIS — K5792 Diverticulitis of intestine, part unspecified, without perforation or abscess without bleeding: Secondary | ICD-10-CM

## 2021-01-20 LAB — CBC WITH DIFFERENTIAL/PLATELET
Basophils Absolute: 0.1 10*3/uL (ref 0.0–0.1)
Basophils Relative: 0.5 % (ref 0.0–3.0)
Eosinophils Absolute: 0.1 10*3/uL (ref 0.0–0.7)
Eosinophils Relative: 0.9 % (ref 0.0–5.0)
HCT: 40.7 % (ref 36.0–46.0)
Hemoglobin: 13.2 g/dL (ref 12.0–15.0)
Lymphocytes Relative: 17.9 % (ref 12.0–46.0)
Lymphs Abs: 3 10*3/uL (ref 0.7–4.0)
MCHC: 32.4 g/dL (ref 30.0–36.0)
MCV: 86.9 fl (ref 78.0–100.0)
Monocytes Absolute: 1 10*3/uL (ref 0.1–1.0)
Monocytes Relative: 5.8 % (ref 3.0–12.0)
Neutro Abs: 12.7 10*3/uL — ABNORMAL HIGH (ref 1.4–7.7)
Neutrophils Relative %: 74.9 % (ref 43.0–77.0)
Platelets: 468 10*3/uL — ABNORMAL HIGH (ref 150.0–400.0)
RBC: 4.68 Mil/uL (ref 3.87–5.11)
RDW: 13.3 % (ref 11.5–15.5)
WBC: 16.9 10*3/uL — ABNORMAL HIGH (ref 4.0–10.5)

## 2021-01-20 LAB — BASIC METABOLIC PANEL
BUN: 7 mg/dL (ref 6–23)
CO2: 26 mEq/L (ref 19–32)
Calcium: 9.4 mg/dL (ref 8.4–10.5)
Chloride: 104 mEq/L (ref 96–112)
Creatinine, Ser: 0.85 mg/dL (ref 0.40–1.20)
GFR: 66.56 mL/min (ref >60.00)
Glucose, Bld: 87 mg/dL (ref 70–99)
Potassium: 4.8 mEq/L (ref 3.5–5.1)
Sodium: 136 mEq/L (ref 135–145)

## 2021-01-20 LAB — URINE CULTURE
MICRO NUMBER:: 11531216
Result:: NO GROWTH
SPECIMEN QUALITY:: ADEQUATE

## 2021-01-21 ENCOUNTER — Other Ambulatory Visit: Payer: Medicare Other

## 2021-01-21 MED ORDER — OMEPRAZOLE 40 MG PO CPDR: DELAYED_RELEASE_CAPSULE | ORAL | 0 refills | Status: DC

## 2021-01-21 NOTE — Telephone Encounter (Signed)
Prescription sent to patient's pharmacy until scheduled appt. 

## 2021-01-21 NOTE — Telephone Encounter (Signed)
Inbound call from patient stating she has an appointment scheduled for 02/25/2021 but will run out of omeprazole before then and is requesting a refill be sent to pharmacy in chart please.

## 2021-01-21 NOTE — Addendum Note (Signed)
Addended by: Dorisann Frames L on: 01/21/2021 01:55 PM   Modules accepted: Orders

## 2021-01-22 NOTE — Telephone Encounter (Signed)
LVM asking patient to call back and get scheduled.

## 2021-01-26 DIAGNOSIS — H903 Sensorineural hearing loss, bilateral: Secondary | ICD-10-CM | POA: Diagnosis not present

## 2021-01-27 ENCOUNTER — Other Ambulatory Visit: Payer: Self-pay

## 2021-01-27 ENCOUNTER — Other Ambulatory Visit (INDEPENDENT_AMBULATORY_CARE_PROVIDER_SITE_OTHER): Payer: Medicare Other

## 2021-01-27 DIAGNOSIS — K5792 Diverticulitis of intestine, part unspecified, without perforation or abscess without bleeding: Secondary | ICD-10-CM | POA: Diagnosis not present

## 2021-01-28 ENCOUNTER — Encounter: Payer: Self-pay | Admitting: Family Medicine

## 2021-01-28 DIAGNOSIS — R197 Diarrhea, unspecified: Secondary | ICD-10-CM

## 2021-01-28 DIAGNOSIS — K5792 Diverticulitis of intestine, part unspecified, without perforation or abscess without bleeding: Secondary | ICD-10-CM

## 2021-01-28 DIAGNOSIS — D72829 Elevated white blood cell count, unspecified: Secondary | ICD-10-CM

## 2021-01-28 LAB — CBC WITH DIFFERENTIAL/PLATELET
Basophils Absolute: 0.1 10*3/uL (ref 0.0–0.1)
Basophils Relative: 0.9 % (ref 0.0–3.0)
Eosinophils Absolute: 0.2 10*3/uL (ref 0.0–0.7)
Eosinophils Relative: 1.1 % (ref 0.0–5.0)
HCT: 36.1 % (ref 36.0–46.0)
Hemoglobin: 11.9 g/dL — ABNORMAL LOW (ref 12.0–15.0)
Lymphocytes Relative: 9.9 % — ABNORMAL LOW (ref 12.0–46.0)
Lymphs Abs: 1.4 10*3/uL (ref 0.7–4.0)
MCHC: 32.9 g/dL (ref 30.0–36.0)
MCV: 86.8 fl (ref 78.0–100.0)
Monocytes Absolute: 1.2 10*3/uL — ABNORMAL HIGH (ref 0.1–1.0)
Monocytes Relative: 8 % (ref 3.0–12.0)
Neutro Abs: 11.6 10*3/uL — ABNORMAL HIGH (ref 1.4–7.7)
Neutrophils Relative %: 80.1 % — ABNORMAL HIGH (ref 43.0–77.0)
Platelets: 241 10*3/uL (ref 150.0–400.0)
RBC: 4.16 Mil/uL (ref 3.87–5.11)
RDW: 13.7 % (ref 11.5–15.5)
WBC: 14.5 10*3/uL — ABNORMAL HIGH (ref 4.0–10.5)

## 2021-01-29 NOTE — Telephone Encounter (Signed)
Please call patient. I have ordered an abd/pelvic CT to recheck her diverticulitis and ensure there is no abscess given increase in WBC and persistent left lower abd pain with diarrhea.

## 2021-01-31 ENCOUNTER — Inpatient Hospital Stay (HOSPITAL_COMMUNITY): Payer: Medicare Other

## 2021-01-31 ENCOUNTER — Emergency Department (HOSPITAL_COMMUNITY): Payer: Medicare Other

## 2021-01-31 ENCOUNTER — Other Ambulatory Visit: Payer: Self-pay

## 2021-01-31 ENCOUNTER — Inpatient Hospital Stay (HOSPITAL_COMMUNITY)
Admission: EM | Admit: 2021-01-31 | Discharge: 2021-02-03 | DRG: 247 | Disposition: A | Discharge status: Home / Self Care | Payer: Medicare Other | Attending: Internal Medicine | Admitting: Internal Medicine

## 2021-01-31 DIAGNOSIS — N179 Acute kidney failure, unspecified: Secondary | ICD-10-CM | POA: Diagnosis not present

## 2021-01-31 DIAGNOSIS — Z833 Family history of diabetes mellitus: Secondary | ICD-10-CM

## 2021-01-31 DIAGNOSIS — Z885 Allergy status to narcotic agent status: Secondary | ICD-10-CM

## 2021-01-31 DIAGNOSIS — J449 Chronic obstructive pulmonary disease, unspecified: Secondary | ICD-10-CM | POA: Diagnosis present

## 2021-01-31 DIAGNOSIS — E876 Hypokalemia: Secondary | ICD-10-CM

## 2021-01-31 DIAGNOSIS — R0789 Other chest pain: Secondary | ICD-10-CM | POA: Diagnosis not present

## 2021-01-31 DIAGNOSIS — I10 Essential (primary) hypertension: Secondary | ICD-10-CM | POA: Diagnosis not present

## 2021-01-31 DIAGNOSIS — Z951 Presence of aortocoronary bypass graft: Secondary | ICD-10-CM | POA: Diagnosis not present

## 2021-01-31 DIAGNOSIS — R6889 Other general symptoms and signs: Secondary | ICD-10-CM | POA: Diagnosis not present

## 2021-01-31 DIAGNOSIS — M1990 Unspecified osteoarthritis, unspecified site: Secondary | ICD-10-CM | POA: Diagnosis not present

## 2021-01-31 DIAGNOSIS — E782 Mixed hyperlipidemia: Secondary | ICD-10-CM | POA: Diagnosis present

## 2021-01-31 DIAGNOSIS — M797 Fibromyalgia: Secondary | ICD-10-CM | POA: Diagnosis not present

## 2021-01-31 DIAGNOSIS — I2510 Atherosclerotic heart disease of native coronary artery without angina pectoris: Secondary | ICD-10-CM | POA: Diagnosis present

## 2021-01-31 DIAGNOSIS — Z83438 Family history of other disorder of lipoprotein metabolism and other lipidemia: Secondary | ICD-10-CM

## 2021-01-31 DIAGNOSIS — L7632 Postprocedural hematoma of skin and subcutaneous tissue following other procedure: Secondary | ICD-10-CM | POA: Diagnosis not present

## 2021-01-31 DIAGNOSIS — K760 Fatty (change of) liver, not elsewhere classified: Secondary | ICD-10-CM | POA: Diagnosis not present

## 2021-01-31 DIAGNOSIS — I5032 Chronic diastolic (congestive) heart failure: Secondary | ICD-10-CM | POA: Diagnosis present

## 2021-01-31 DIAGNOSIS — Y840 Cardiac catheterization as the cause of abnormal reaction of the patient, or of later complication, without mention of misadventure at the time of the procedure: Secondary | ICD-10-CM | POA: Diagnosis not present

## 2021-01-31 DIAGNOSIS — Z20822 Contact with and (suspected) exposure to covid-19: Secondary | ICD-10-CM | POA: Diagnosis not present

## 2021-01-31 DIAGNOSIS — E785 Hyperlipidemia, unspecified: Secondary | ICD-10-CM | POA: Diagnosis not present

## 2021-01-31 DIAGNOSIS — K219 Gastro-esophageal reflux disease without esophagitis: Secondary | ICD-10-CM | POA: Diagnosis not present

## 2021-01-31 DIAGNOSIS — Z90710 Acquired absence of both cervix and uterus: Secondary | ICD-10-CM | POA: Diagnosis not present

## 2021-01-31 DIAGNOSIS — Z743 Need for continuous supervision: Secondary | ICD-10-CM | POA: Diagnosis not present

## 2021-01-31 DIAGNOSIS — I2582 Chronic total occlusion of coronary artery: Secondary | ICD-10-CM | POA: Diagnosis present

## 2021-01-31 DIAGNOSIS — I214 Non-ST elevation (NSTEMI) myocardial infarction: Secondary | ICD-10-CM

## 2021-01-31 DIAGNOSIS — I25810 Atherosclerosis of coronary artery bypass graft(s) without angina pectoris: Secondary | ICD-10-CM | POA: Diagnosis not present

## 2021-01-31 DIAGNOSIS — I110 Hypertensive heart disease with heart failure: Secondary | ICD-10-CM | POA: Diagnosis present

## 2021-01-31 DIAGNOSIS — M810 Age-related osteoporosis without current pathological fracture: Secondary | ICD-10-CM | POA: Diagnosis not present

## 2021-01-31 DIAGNOSIS — Z881 Allergy status to other antibiotic agents status: Secondary | ICD-10-CM

## 2021-01-31 DIAGNOSIS — Z955 Presence of coronary angioplasty implant and graft: Secondary | ICD-10-CM

## 2021-01-31 DIAGNOSIS — Z7982 Long term (current) use of aspirin: Secondary | ICD-10-CM

## 2021-01-31 DIAGNOSIS — Z8249 Family history of ischemic heart disease and other diseases of the circulatory system: Secondary | ICD-10-CM

## 2021-01-31 DIAGNOSIS — Z888 Allergy status to other drugs, medicaments and biological substances status: Secondary | ICD-10-CM

## 2021-01-31 DIAGNOSIS — R197 Diarrhea, unspecified: Secondary | ICD-10-CM | POA: Diagnosis not present

## 2021-01-31 DIAGNOSIS — Z87891 Personal history of nicotine dependence: Secondary | ICD-10-CM

## 2021-01-31 DIAGNOSIS — Z7951 Long term (current) use of inhaled steroids: Secondary | ICD-10-CM

## 2021-01-31 DIAGNOSIS — E1151 Type 2 diabetes mellitus with diabetic peripheral angiopathy without gangrene: Secondary | ICD-10-CM | POA: Diagnosis present

## 2021-01-31 DIAGNOSIS — I081 Rheumatic disorders of both mitral and tricuspid valves: Secondary | ICD-10-CM | POA: Diagnosis present

## 2021-01-31 DIAGNOSIS — Z79899 Other long term (current) drug therapy: Secondary | ICD-10-CM

## 2021-01-31 DIAGNOSIS — I499 Cardiac arrhythmia, unspecified: Secondary | ICD-10-CM | POA: Diagnosis not present

## 2021-01-31 DIAGNOSIS — R079 Chest pain, unspecified: Secondary | ICD-10-CM | POA: Diagnosis not present

## 2021-01-31 LAB — TROPONIN I (HIGH SENSITIVITY)
Troponin I (High Sensitivity): 618 ng/L (ref <18)
Troponin I (High Sensitivity): 881 ng/L (ref <18)
Troponin I (High Sensitivity): 1085 ng/L (ref <18)

## 2021-01-31 LAB — GLUCOSE, CAPILLARY
Glucose-Capillary: 89 mg/dL (ref 70–99)
Glucose-Capillary: 79 mg/dL (ref 70–99)

## 2021-01-31 LAB — COMPREHENSIVE METABOLIC PANEL
ALT: 15 U/L (ref 0–44)
AST: 26 U/L (ref 15–41)
Albumin: 2.4 g/dL — ABNORMAL LOW (ref 3.5–5.0)
Alkaline Phosphatase: 47 U/L (ref 38–126)
Anion gap: 11 (ref 5–15)
BUN: 16 mg/dL (ref 8–23)
CO2: 23 mmol/L (ref 22–32)
Calcium: 8.9 mg/dL (ref 8.9–10.3)
Chloride: 102 mmol/L (ref 98–111)
Creatinine, Ser: 1.82 mg/dL — ABNORMAL HIGH (ref 0.44–1.00)
GFR, Estimated: 28 mL/min — ABNORMAL LOW (ref >60)
Glucose, Bld: 111 mg/dL — ABNORMAL HIGH (ref 70–99)
Potassium: 3.5 mmol/L (ref 3.5–5.1)
Sodium: 136 mmol/L (ref 135–145)
Total Bilirubin: 0.7 mg/dL (ref 0.3–1.2)
Total Protein: 4.9 g/dL — ABNORMAL LOW (ref 6.5–8.1)

## 2021-01-31 LAB — RESP PANEL BY RT-PCR (FLU A&B, COVID) ARPGX2
Influenza A by PCR: NEGATIVE
Influenza B by PCR: NEGATIVE
SARS Coronavirus 2 by RT PCR: NEGATIVE

## 2021-01-31 LAB — CBC
HCT: 31.5 % — ABNORMAL LOW (ref 36.0–46.0)
Hemoglobin: 10.8 g/dL — ABNORMAL LOW (ref 12.0–15.0)
MCH: 30 pg (ref 26.0–34.0)
MCHC: 34.3 g/dL (ref 30.0–36.0)
MCV: 87.5 fL (ref 80.0–100.0)
Platelets: 203 10*3/uL (ref 150–400)
RBC: 3.6 MIL/uL — ABNORMAL LOW (ref 3.87–5.11)
RDW: 13.5 % (ref 11.5–15.5)
WBC: 7.6 10*3/uL (ref 4.0–10.5)
nRBC: 0 % (ref 0.0–0.2)

## 2021-01-31 LAB — HEPARIN LEVEL (UNFRACTIONATED)
Heparin Unfractionated: <0.1 IU/mL — ABNORMAL LOW (ref 0.30–0.70)
Heparin Unfractionated: 0.16 IU/mL — ABNORMAL LOW (ref 0.30–0.70)

## 2021-01-31 LAB — CBG MONITORING, ED: Glucose-Capillary: 110 mg/dL — ABNORMAL HIGH (ref 70–99)

## 2021-01-31 MED ORDER — GABAPENTIN 300 MG PO CAPS
300.0000 mg | ORAL_CAPSULE | Freq: Two times a day (BID) | ORAL | Status: DC
  Administered 2021-01-31 – 2021-02-03 (×6): 300 mg via ORAL
  Filled 2021-01-31 (×6): qty 1

## 2021-01-31 MED ORDER — POTASSIUM CHLORIDE ER 10 MEQ PO TBCR
40.0000 meq | EXTENDED_RELEASE_TABLET | Freq: Once | ORAL | Status: AC
  Administered 2021-01-31: 40 meq via ORAL
  Filled 2021-01-31: qty 4

## 2021-01-31 MED ORDER — HYDRALAZINE HCL 10 MG PO TABS
10.0000 mg | ORAL_TABLET | Freq: Three times a day (TID) | ORAL | Status: DC
  Administered 2021-01-31 – 2021-02-02 (×8): 10 mg via ORAL
  Filled 2021-01-31 (×8): qty 1

## 2021-01-31 MED ORDER — ASPIRIN EC 81 MG PO TBEC
81.0000 mg | DELAYED_RELEASE_TABLET | Freq: Every day | ORAL | Status: DC
Start: 2021-02-01 — End: 2021-02-01
  Administered 2021-02-01: 81 mg via ORAL
  Filled 2021-01-31: qty 1

## 2021-01-31 MED ORDER — ATORVASTATIN CALCIUM 80 MG PO TABS
80.0000 mg | ORAL_TABLET | Freq: Every day | ORAL | Status: DC
  Administered 2021-01-31 – 2021-02-03 (×4): 80 mg via ORAL
  Filled 2021-01-31 (×4): qty 1

## 2021-01-31 MED ORDER — NITROGLYCERIN 0.4 MG SL SUBL: 0.4000 mg | SUBLINGUAL_TABLET | SUBLINGUAL | Status: DC | PRN

## 2021-01-31 MED ORDER — METOPROLOL TARTRATE 12.5 MG HALF TABLET
12.5000 mg | ORAL_TABLET | Freq: Two times a day (BID) | ORAL | Status: DC
  Administered 2021-01-31 – 2021-02-02 (×5): 12.5 mg via ORAL
  Filled 2021-01-31 (×5): qty 1

## 2021-01-31 MED ORDER — SIMETHICONE 80 MG PO CHEW
80.0000 mg | CHEWABLE_TABLET | Freq: Four times a day (QID) | ORAL | Status: DC | PRN
  Administered 2021-01-31 – 2021-02-03 (×3): 80 mg via ORAL
  Filled 2021-01-31 (×3): qty 1

## 2021-01-31 MED ORDER — ONDANSETRON HCL 4 MG/2ML IJ SOLN: 4.0000 mg | Freq: Four times a day (QID) | INTRAMUSCULAR | Status: DC | PRN

## 2021-01-31 MED ORDER — HEPARIN BOLUS VIA INFUSION
2500.0000 [IU] | Freq: Once | INTRAVENOUS | Status: AC
  Administered 2021-01-31: 2500 [IU] via INTRAVENOUS
  Filled 2021-01-31: qty 2500

## 2021-01-31 MED ORDER — HEPARIN BOLUS VIA INFUSION
2000.0000 [IU] | Freq: Once | INTRAVENOUS | Status: AC
  Administered 2021-01-31: 2000 [IU] via INTRAVENOUS
  Filled 2021-01-31: qty 2000

## 2021-01-31 MED ORDER — ASPIRIN 81 MG PO CHEW
324.0000 mg | CHEWABLE_TABLET | Freq: Once | ORAL | Status: DC
  Filled 2021-01-31: qty 4

## 2021-01-31 MED ORDER — LOPERAMIDE HCL 2 MG PO CAPS
2.0000 mg | ORAL_CAPSULE | ORAL | Status: DC | PRN
  Administered 2021-01-31 – 2021-02-02 (×3): 2 mg via ORAL
  Filled 2021-01-31 (×3): qty 1

## 2021-01-31 MED ORDER — ACETAMINOPHEN 325 MG PO TABS
650.0000 mg | ORAL_TABLET | ORAL | Status: DC | PRN
  Administered 2021-01-31: 650 mg via ORAL
  Filled 2021-01-31: qty 2

## 2021-01-31 MED ORDER — ALBUTEROL SULFATE HFA 108 (90 BASE) MCG/ACT IN AERS
2.0000 | INHALATION_SPRAY | RESPIRATORY_TRACT | Status: DC | PRN
  Filled 2021-01-31: qty 6.7

## 2021-01-31 MED ORDER — ALPRAZOLAM 0.25 MG PO TABS: 0.2500 mg | ORAL_TABLET | Freq: Every evening | ORAL | Status: DC | PRN

## 2021-01-31 MED ORDER — HEPARIN (PORCINE) 25000 UT/250ML-% IV SOLN
800.0000 [IU]/h | INTRAVENOUS | Status: DC
  Filled 2021-01-31: qty 250

## 2021-01-31 MED ORDER — SODIUM CHLORIDE 0.9 % IV SOLN: INTRAVENOUS | Status: AC

## 2021-01-31 MED ORDER — INSULIN ASPART 100 UNIT/ML ~~LOC~~ SOLN: 0.0000 [IU] | Freq: Three times a day (TID) | SUBCUTANEOUS | Status: DC

## 2021-01-31 MED ORDER — HEPARIN (PORCINE) 25000 UT/250ML-% IV SOLN
1250.0000 [IU]/h | INTRAVENOUS | Status: DC
  Administered 2021-01-31: 700 [IU]/h via INTRAVENOUS
  Administered 2021-01-31: 800 [IU]/h via INTRAVENOUS
  Administered 2021-02-01: 900 [IU]/h via INTRAVENOUS
  Administered 2021-02-02: 1250 [IU]/h via INTRAVENOUS
  Filled 2021-01-31 (×2): qty 250

## 2021-01-31 MED ORDER — SODIUM CHLORIDE 0.9 % IV BOLUS
1000.0000 mL | Freq: Once | INTRAVENOUS | Status: AC
  Administered 2021-01-31: 1000 mL via INTRAVENOUS

## 2021-01-31 MED ORDER — HEPARIN BOLUS VIA INFUSION
3400.0000 [IU] | Freq: Once | INTRAVENOUS | Status: AC
  Administered 2021-01-31: 3400 [IU] via INTRAVENOUS
  Filled 2021-01-31: qty 3400

## 2021-01-31 NOTE — Progress Notes (Signed)
ANTICOAGULATION CONSULT NOTE - Initial Consult  Pharmacy Consult for heparin Indication: chest pain/ACS  Allergies  Allergen Reactions  . Anticoagulant Compound Itching and Other (See Comments)    Hands and feet tingle and itch.   Darlin Coco [Valdecoxib] Other (See Comments)    Increased lft's   . Hydrocod Polst-Cpm Polst Er Rash  . Lipitor [Atorvastatin] Other (See Comments)    Leg weakness   . Mevacor [Lovastatin] Other (See Comments)    Muscle weakness   . Plavix [Clopidogrel Bisulfate] Itching and Other (See Comments)    Hands and feet tingle and itch  . Zocor [Simvastatin] Other (See Comments)    Bones hurt   . Doxycycline Diarrhea    N/v/d  . Metoprolol Other (See Comments)    Hair loss  . Morphine And Related Itching and Nausea Only  . Codeine Itching and Rash    Hyper-active  . Lisinopril Cough    Patient Measurements: Height: 5' (152.4 cm) Weight: 57.9 kg (127 lb 11.2 oz) IBW/kg (Calculated) : 45.5 Heparin Dosing Weight: 57.7 kg  Vital Signs: Temp: 97.9 F (36.6 C) (02/26 1105) Temp Source: Oral (02/26 1105) BP: 125/45 (02/26 0600) Pulse Rate: 78 (02/26 0600)  Labs: Recent Labs    01/31/21 0445 01/31/21 0600 01/31/21 0714 01/31/21 0944 01/31/21 1309  HGB  --  10.8*  --   --   --   HCT  --  31.5*  --   --   --   PLT  --  203  --   --   --   HEPARINUNFRC  --   --   --   --  <0.10*  CREATININE 1.82*  --   --   --   --   TROPONINIHS 618*  --  881* 1,085*  --     Estimated Creatinine Clearance: 21 mL/min (A) (by C-G formula based on SCr of 1.82 mg/dL (H)).   Medical History: Past Medical History:  Diagnosis Date  . AAA (abdominal aortic aneurysm) ([place])    a. s/p stent grafting in 2009.  . Adenomatous colon polyp 05/2001  . Adenomatous duodenal polyp   . Allergy   . Anginal pain ([place])   . COPD (chronic obstructive pulmonary disease) ([place])    pt denies COPD  . Coronary artery disease    a. s/p CABG in 1995;  b. 05/2013 Neg MV, EF 82%;  c.  06/2013 Cath: LM nl, LAD 40-50p, LCX nl, RCA 80p/166m, VG->RCA->PDA->PLSA min irregs, LIMA->LAD atretic, vigorous LV fxn.  . Diabetes mellitus without complication ([facility])    history of, resolved. 2017  . Diverticulosis   . Eczema, dyshidrotic   . Eczema, dyshidrotic 1986   Dr. Mathis Fare  . Emphysema of lung ([place])   . Fatty liver   . Fibromyalgia   . GERD (gastroesophageal reflux disease)   . Hyperlipidemia   . Hypertension   . Migraine   . Myocardial infarction ([place])   . Osteoarthritis   . Osteoporosis    unsure, having a bone scan soon  . Peripheral arterial disease ([facility])    left leg diagnosed by Dr Mare Ferrari  . Renal artery stenosis ([place])    a. 2008 s/p PTA    Medications:  See medication history  Assessment: 30 yoF with CP to start heparin. She has allergy to anticoagulant compound but not a serious reaction.  ~7hr f/u level undetectable. Per RN, line has occluded occasionally. Will give a repeat bolus and increase rate. No s/sx bleeding  noted, Hgb ~11 and PLT ~200.    Goal of Therapy:  Heparin level 0.3-0.7 units/ml Monitor platelets by anticoagulation protocol: Yes   Plan:  Give heparin bolus 3,400 units and drip at 800 units/hr Check heparin level 6 hours Follow daily HL and CBC, monitor for bleeding complications   Mercy Riding, PharmD PGY1 Acute Care Pharmacy Resident Please refer to [facility] for unit-specific pharmacist

## 2021-01-31 NOTE — ED Notes (Signed)
Attempted to call report; RN unable to take report at this time.

## 2021-01-31 NOTE — ED Provider Notes (Signed)
Megan [name] Provider Note   CSN: [id number] Arrival date & time: 01/31/21  0408     History Chief Complaint  Patient presents with  . Chest Pain    Megan [name] is a 75 y.o. female.  75 year old female with a significant past medical history who presents to the emergency department today secondary to chest pain.  Patient states she was sleeping.  She woke up with a dull ache under her left breast.  This persisted.  She states has had this before but quickly.  Today it continued.  She had no associated shortness of breath, diaphoresis, dizziness or vomiting.  Took a nitroglycerin seemed to get better.  Called EMS already for further evaluation.   Chest Pain Associated symptoms: no shortness of breath and no vomiting        Past Medical History:  Diagnosis Date  . AAA (abdominal aortic aneurysm) ([place])    a. s/p stent grafting in 2009.  . Adenomatous colon polyp 05/2001  . Adenomatous duodenal polyp   . Allergy   . Anginal pain ([place])   . COPD (chronic obstructive pulmonary disease) ([place])    pt denies COPD  . Coronary artery disease    a. s/p CABG in 1995;  b. 05/2013 Neg MV, EF 82%;  c. 06/2013 Cath: LM nl, LAD 40-50p, LCX nl, RCA 80p/141m, VG->RCA->PDA->PLSA min irregs, LIMA->LAD atretic, vigorous LV fxn.  . Diabetes mellitus without complication (HCC)    history of, resolved. 2017  . Diverticulosis   . Eczema, dyshidrotic   . Eczema, dyshidrotic 1986   Dr. Mathis Fare  . Emphysema of lung ([place])   . Fatty liver   . Fibromyalgia   . GERD (gastroesophageal reflux disease)   . Hyperlipidemia   . Hypertension   . Migraine   . Myocardial infarction ([place])   . Osteoarthritis   . Osteoporosis    unsure, having a bone scan soon  . Peripheral arterial disease (HCC)    left leg diagnosed by Dr Mare Ferrari  . Renal artery stenosis ([place])    a. 2008 s/p PTA    Patient Active Problem List   Diagnosis Date Noted  . NSTEMI (non-ST  elevated myocardial infarction) ([place]) 01/31/2021  . AKI (acute kidney injury) ([place]) 01/06/2021  . Diarrhea 01/06/2021  . COPD with chronic bronchitis ([place]) 09/28/2018  . Essential hypertension 07/10/2018  . IFG (impaired fasting glucose) 11/10/2017  . DJD (degenerative joint disease), lumbosacral 11/10/2017  . Fibromyalgia 11/10/2017  . Dyshidrotic eczema 11/10/2017  . GERD (gastroesophageal reflux disease) 11/10/2017  . Osteopenia 11/10/2017  . Does use hearing aid 11/10/2017  . 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  . Fatty liver 11/10/2017  . Coronary artery disease   . Peripheral vascular disease with claudication ([place])   . 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  . History of abdominal aortic aneurysm repair 05/18/2011  . History of renal artery stenosis 05/18/2011  . Mixed hyperlipidemia     Past Surgical History:  Procedure Laterality Date  . ABDOMINAL AORTAGRAM N/A 03/06/2014   Procedure: ABDOMINAL AORTAGRAM;  Surgeon: Rosetta Posner, MD;  Location: [facility] CATH LAB;  Service: Cardiovascular;  Laterality: N/A;  . ABDOMINAL AORTIC ANEURYSM REPAIR  2009   stenting  . ABDOMINAL HYSTERECTOMY     Total, 1992  . BREAST BIOPSY    . CARDIAC CATHETERIZATION    . CARDIOVASCULAR STRESS TEST  11/11/2009   normal study  . CARPAL TUNNEL RELEASE  08/1993   left wrist  . CARPAL TUNNEL  RELEASE  01/1997   right  . CATARACT EXTRACTION, BILATERAL  2021  . CORONARY ARTERY BYPASS GRAFT  07/1994   Triple by Dr Ceasar Mons  . dental implants    . EYE SURGERY  03/2020   cataract  . HAND SURGERY Right 09/2002   Right hand pulley release  . LEFT HEART CATHETERIZATION WITH CORONARY/GRAFT ANGIOGRAM N/A 06/11/2013   Procedure: LEFT HEART CATHETERIZATION WITH [name];  Surgeon: Thayer Headings, MD;  Location: [facility] CATH LAB;  Service: Cardiovascular;  Laterality: N/A;  . MULTIPLE TOOTH EXTRACTIONS  2000   All upper teeth.  Has full dneture.   [name] RENAL ARTERY STENT  2008  . SHOULDER ARTHROSCOPY WITH ROTATOR  CUFF REPAIR Right 09/27/2018   Procedure: Right shoulder mini open rotator cuff repair;  Surgeon: Susa Day, MD;  Location: [facility] ORS;  Service: Orthopedics;  Laterality: Right;  90 mins  . thyroid cyst apiration  05/1997 and 07/1998  . TONSILECTOMY, ADENOIDECTOMY, BILATERAL MYRINGOTOMY AND TUBES  1989  . TONSILLECTOMY    . TOTAL ABDOMINAL HYSTERECTOMY  1992     OB History   No obstetric history on file.     Family History  Problem Relation Age of Onset  . Heart disease Mother   . Heart attack Mother   . Heart disease Father        before age 70  . Heart attack Father   . Heart disease Sister        before age 27  . Diabetes Sister   . Hyperlipidemia Sister   . Hypertension Sister   . Heart attack Sister   . Liver cancer Sister   . Breast cancer Sister   . Heart disease Brother   . Diabetes Brother   . Hyperlipidemia Brother   . Hypertension Brother   . Heart attack Brother   . AAA (abdominal aortic aneurysm) Brother   . Colon cancer Paternal Grandmother   . Heart disease Brother   . Colon cancer Cousin        first cousin on father's side  . Pancreatic cancer Neg Hx   . Rectal cancer Neg Hx   . Stomach cancer Neg Hx   . Esophageal cancer Neg Hx     Social History   Tobacco Use  . Smoking status: Former Smoker    Packs/day: 0.00    Years: 37.00    Pack years: 0.00    Types: Cigarettes    Quit date: 12/07/1995    Years since quitting: 25.1  . Smokeless tobacco: Never Used  Vaping Use  . Vaping Use: Never used  Substance Use Topics  . Alcohol use: Yes    Alcohol/week: 6.0 - 10.0 standard drinks    Types: 6 - 10 Glasses of wine per week    Comment: 2-3 glasses daily  . Drug use: No    Home Medications Prior to Admission medications   Medication Sig Start Date End Date Taking? Authorizing Provider  Accu-Chek FastClix Lancets MISC Check blood sugar once daily PRN 07/30/19   [name], MD  albuterol (PROVENTIL HFA;VENTOLIN HFA) 108 (90 Base) MCG/ACT  inhaler Inhale 2 puffs into the lungs every 4 (four) hours as needed for wheezing or shortness of breath. 11/13/18   [name], MD  ALPRAZolam Duanne Moron) 0.25 MG tablet Take 1 tablet (0.25 mg total) by mouth at bedtime as needed for sleep. 09/15/20   [place]ique, Peter M, MD  amLODipine (NORVASC) 5 MG tablet TAKE 1 TABLET  BY MOUTH EVERY DAY Patient taking differently: Take 5 mg by mouth daily. 06/12/20   [place], Peter M, MD  aspirin EC 81 MG tablet Take 1 tablet (81 mg total) by mouth daily. 05/09/20   [place], Peter M, MD  atorvastatin (LIPITOR) 80 MG tablet TAKE 1 TABLET BY MOUTH EVERY DAY Patient taking differently: Take 80 mg by mouth daily. 08/18/20   [place], Peter M, MD  augmented betamethasone dipropionate (DIPROLENE-AF) 0.05 % cream Apply 1 application topically daily as needed (rash). For hands 10/01/15   [provider]  B Complex-Biotin-FA (B-100 COMPLEX PO) Take 1 tablet by mouth at bedtime.     [provider]  Budeson-Glycopyrrol-Formoterol (BREZTRI AEROSPHERE) 160-9-4.8 MCG/ACT AERO Inhale 2 puffs into the lungs in the morning and at bedtime. Patient taking differently: Inhale 1 puff into the lungs daily as needed (shortness og breath). 02/27/20   [name], MD  ciclopirox (LOPROX) 0.77 % cream Apply 1 application topically daily as needed (hands fungus).     [provider]  CORDRAN 4 MCG/SQCM TAPE Apply 1 each topically daily as needed (Rash).  05/17/19   [provider]  cycloSPORINE (RESTASIS) 0.05 % ophthalmic emulsion Place 1 drop into both eyes 2 (two) times daily.     [provider]  gabapentin (NEURONTIN) 300 MG capsule Take 1-2 capsules (300-600 mg total) by mouth 3 (three) times daily. Patient taking differently: Take 300 mg by mouth 2 (two) times daily. 11/15/19   Lomax, Amy, NP  glucose blood (ACCU-CHEK SMARTVIEW) test strip CHECK BLOOD SUGAR ONCE DAILY AS NEEDED 01/17/20   [name], MD  hydrALAZINE (APRESOLINE) 10 MG tablet  TAKE 1 TABLET BY MOUTH THREE TIMES A DAY Patient taking differently: Take 10 mg by mouth 2 (two) times daily. 01/21/20   [place], Peter M, MD  loperamide (IMODIUM) 2 MG capsule Take 1 capsule (2 mg total) by mouth every 6 (six) hours as needed for diarrhea or loose stools. Also available OTC 01/13/21   Rai, Vernelle Emerald, MD  losartan-hydrochlorothiazide (HYZAAR) 100-25 MG tablet Take 1 tablet by mouth daily. 01/15/21   Rai, Ripudeep Raliegh Ip, MD  meloxicam (MOBIC) 15 MG tablet Take 1 tablet (15 mg total) by mouth daily. 01/15/21   Rai, Vernelle Emerald, MD  Multiple Vitamins-Minerals (MULTIVITAMIN WITH MINERALS) tablet Take 1 tablet by mouth at bedtime.    [provider]  nitroGLYCERIN (NITROSTAT) 0.4 MG SL tablet Place 1 tablet (0.4 mg total) under the tongue every 5 (five) minutes as needed for chest pain. Patient taking differently: Place 0.4 mg under the tongue every 5 (five) minutes as needed for chest pain (max 3 doses). 01/09/20   [place], Peter M, MD  nystatin (MYCOSTATIN) 100000 UNIT/ML suspension Take 5 mLs (500,000 Units total) by mouth 4 (four) times daily. 01/19/21   [name], MD  omeprazole (PRILOSEC) 40 MG capsule TAKE 1 CAPSULE BY MOUTH TWICE A DAY--NEED APPT FOR FURTHER REFILLS 01/21/21   Ladene Artist, MD  ondansetron (ZOFRAN) 8 MG tablet Take by mouth every 8 (eight) hours as needed for nausea or vomiting.    [provider]  oxyCODONE-acetaminophen (PERCOCET) 10-325 MG tablet Take 1 tablet by mouth every 6 (six) hours as needed for pain. Patient taking differently: Take 1 tablet by mouth daily as needed for pain. 06/19/20 06/19/21  Meyran, Ocie Cornfield, NP  potassium chloride (KLOR-CON) 10 MEQ tablet Take 1 tablet (10 mEq total) by mouth 2 (two) times daily. 01/13/21   Rai,  Ripudeep K, MD  saccharomyces boulardii (FLORASTOR) 250 MG capsule Take 1 capsule (250 mg total) by mouth 2 (two) times daily. Any probiotic available is okay 01/13/21   Rai, Vernelle Emerald, MD  simethicone  (MYLICON) 80 MG chewable tablet Chew 1 tablet (80 mg total) by mouth 4 (four) times daily as needed for flatulence (bloating). also available OTC 01/13/21   Rai, Ripudeep K, MD  torsemide (DEMADEX) 20 MG tablet Take 0.5 tablets (10 mg total) by mouth daily as needed (fluid). 01/13/21   Rai, Vernelle Emerald, MD  tretinoin (RETIN-A) 0.1 % cream Apply 1 application topically every other day. In the evening    [provider]  VITAMIN D, ERGOCALCIFEROL, PO Take 1,000 Units by mouth every evening.     [provider]    Allergies    Anticoagulant compound, Bextra [valdecoxib], Hydrocod polst-cpm polst er, Lipitor [atorvastatin], Mevacor [lovastatin], Plavix [clopidogrel bisulfate], Zocor [simvastatin], Doxycycline, Metoprolol, Morphine and related, Codeine, and Lisinopril  Review of Systems   Review of Systems  Respiratory: Negative for shortness of breath.   Cardiovascular: Positive for chest pain.  Gastrointestinal: Negative for vomiting.  All other systems reviewed and are negative.   Physical Exam Updated Vital Signs BP (!) 125/45   Pulse 78   Temp 97.9 F (36.6 C)   Resp 18   Ht 5' (1.524 m)   Wt 59.5 kg   SpO2 97%   BMI 25.62 kg/m   Physical Exam Vitals and nursing note reviewed.  Constitutional:      Appearance: She is well-developed and well-nourished.  HENT:     Head: Normocephalic and atraumatic.     Mouth/Throat:     Mouth: Mucous membranes are moist.     Pharynx: Oropharynx is clear.  Eyes:     Pupils: Pupils are equal, round, and reactive to light.  Cardiovascular:     Rate and Rhythm: Normal rate and regular rhythm.  Pulmonary:     Effort: No respiratory distress.     Breath sounds: No stridor.  Chest:     Chest wall: No mass or tenderness.  Abdominal:     General: There is no distension.     Palpations: Abdomen is soft.  Musculoskeletal:        General: Normal range of motion.     Cervical back: Normal range of motion.  Skin:    General: Skin  is warm and dry.  Neurological:     General: No focal deficit present.     Mental Status: She is alert.     ED Results / Procedures / Treatments   Labs (all labs ordered are listed, but only abnormal results are displayed) Labs Reviewed  COMPREHENSIVE METABOLIC PANEL - Abnormal; Notable for the following components:      Result Value   Glucose, Bld 111 (*)    Creatinine, Ser 1.82 (*)    Total Protein 4.9 (*)    Albumin 2.4 (*)    GFR, Estimated 28 (*)    All other components within normal limits  CBC - Abnormal; Notable for the following components:   RBC 3.60 (*)    Hemoglobin 10.8 (*)    HCT 31.5 (*)    All other components within normal limits  CBG MONITORING, ED - Abnormal; Notable for the following components:   Glucose-Capillary 110 (*)    All other components within normal limits  TROPONIN I (HIGH SENSITIVITY) - Abnormal; Notable for the following components:   Troponin I (High Sensitivity)  618 (*)    All other components within normal limits  TROPONIN I (HIGH SENSITIVITY) - Abnormal; Notable for the following components:   Troponin I (High Sensitivity) 881 (*)    All other components within normal limits  RESP PANEL BY RT-PCR (FLU A&B, COVID) [id number]  HEPARIN LEVEL (UNFRACTIONATED)    EKG EKG Interpretation  Date/Time:  Saturday January 31 2021 04:16:59 EST Ventricular Rate:  80 PR Interval:    QRS Duration: 94 QT Interval:  394 QTC Calculation: 455 R Axis:   35 Text Interpretation: Sinus rhythm Abnormal T, consider ischemia, diffuse leads new ST changes in multiple leads compared to february Confirmed by Merrily Pew [phone]) on 01/31/2021 4:22:10 AM   Radiology DG Chest Portable 1 View  Result Date: 01/31/2021 CLINICAL DATA:  Chest pain EXAM: PORTABLE CHEST 1 VIEW COMPARISON:  01/10/2021 FINDINGS: Lungs are clear. No pneumothorax or pleural effusion. Coronary artery bypass grafting has been performed. Extensive calcification of the mitral valve annulus  again noted. Cardiac size within normal limits. Pulmonary vascularity is normal. IMPRESSION: No active disease. Electronically Signed   By: Fidela Salisbury MD   On: 01/31/2021 05:36    Procedures .Critical Care Performed by: Merrily Pew, MD Authorized by: Merrily Pew, MD   Critical care provider statement:    Critical care time (minutes):  45   Critical care was necessary to treat or prevent imminent or life-threatening deterioration of the following conditions:  Cardiac failure   Critical care was time spent personally by me on the following activities:  Discussions with consultants, evaluation of patient's response to treatment, examination of patient, ordering and performing treatments and interventions, ordering and review of laboratory studies, ordering and review of radiographic studies, pulse oximetry, re-evaluation of patient's condition, obtaining history from patient or surrogate and review of old charts    Medications Ordered in ED Medications  aspirin chewable tablet 324 mg (324 mg Oral Not Given 01/31/21 0455)  heparin ADULT infusion 100 units/mL (25000 units/276mL) (700 Units/hr Intravenous New Bag/Given 01/31/21 0626)  insulin aspart (novoLOG) injection 0-9 Units (has no administration in time range)  0.9 %  sodium chloride infusion ( Intravenous New Bag/Given 01/31/21 0748)  heparin bolus via infusion 2,500 Units (2,500 Units Intravenous Bolus from Bag 01/31/21 0628)  potassium chloride (KLOR-CON) CR tablet 40 mEq (40 mEq Oral Given 01/31/21 0745)    ED Course  I have reviewed the triage vital signs and the nursing notes.  Pertinent labs & imaging results that were available during my care of the patient were reviewed by me and considered in my medical decision making (see chart for details).    MDM Rules/Calculators/A&P                          NSTEMI, heparin started. CP free. D/w cards for admission, bed requesteed. No aspirin as she already had it at home.    Final Clinical Impression(s) / ED Diagnoses Final diagnoses:  NSTEMI (non-ST elevated myocardial infarction) [facility])    Rx / [place] Orders ED Discharge Orders    None       Bailea Beed, Corene Cornea, MD 01/31/21 [phone]

## 2021-01-31 NOTE — ED Triage Notes (Signed)
Patient arrives via EMS from home due to worsening chest pain x1 week. Patient states that she has central dull chest pain that became worse this morning. She took 1 nitro with no relief and called EMS. Patient reports chest pain a 4/10.   Alert & Oriented x4.  20g L. AC placed by EMS.

## 2021-01-31 NOTE — H&P (Addendum)
CARDIOLOGY ADMISSION NOTE  Patient ID: Megan [name] MRN: [id number] DOB/AGE: December 18, 1945 75 y.o.  Admit date: 01/31/2021 Primary Physician   Billey Chang, MD Primary Cardiologist   [name], MD Chief Complaint    Chest pain  ASSESSMENT AND PLAN:   Chest pain/NSTEMI CAD s/p CABG (1995)-LIMA to LAD, SVG to RCA   2014- OTL:XBWIOMB LIMA, patent grafts, native dx AKI (Cr 1.82) PVD Renal artery stenosis s/p stent 2009 AAA s/p repair 2008, s/p iliac stent 2016 HTN, HLD Recent Diverticulitis/UTI- s/p antibiotics Persistent diarrhea H/o diastolic CHF  Plan: - admit to cardiology service, cardiac telemetry - trend trops, ekg - continue aspirin 81mg , heparin gtt, start metoprolol tart 12.5mg  BID, continue home hydral, hold amlodipine, HCTZ, losartan given AKI - IVF NS 75cc for 16 hours - continue home meds- atorva 80mg  daily, insulin sliding scale - NPO for cardiac catheterization after AKI improves - repeat ECHO  - continue imodium for diarrhea. If more episodes then need to check for c.diff - hypokalemia- replaced  --------------------------------------------------------------------------------------------- HPI:  Megan [name] is a 75 y/o female with PMH CAD s/p CABG (1995)- last LHC 2014-patent grafts, PVD, RAS s/p stent 2008, AAA s/p repair/stent 2009, s/p iliac stent 2016, US Duplex 2019- AAA sac at 4cm, HTN, HLD presented to the ER with c/o chest pain.  Patient states she woke up from sleep with chest pain, dull achy sensation, persisted all night and didn't go away.Took NTG with some relief and called EMS. EMS EKG did not show any ischemic changes but ER EKG shows TWI and rise in trop.  She had similar episode of chest tightness the day before which also lasted for a while and went away after NTG.  She was in the hospital recently 1/31 and discharged on 2/8- for AKI, diarrhea- diverticulitis (sigmoid colon), metabolic encephalopathy, UTI. She completed antibiotics but  still has diarrhea- 4 times/day, loose watery and still has mild abdominal pain, no dysuria. She is taking imodium. She denies any fevers, chills, vomiting, does have mild dull ache in the lower abdomen. She is not taking torsemide.  ER course: no chest pain on arrival, aspirin 325mg , heparin gtt, trop 618, AKI. When I saw her in the ER- she still reports mild chest discomfort but not bad.  EKG:  TWI in inferior and antero lateral leads (when compared to previous one) these are new findings. NSR   Recent ECHO: 01/09/21 IMPRESSIONS   1. Left ventricular ejection fraction, by estimation, is 60 to 65%. The  left ventricle has normal function. The left ventricle has no regional  wall motion abnormalities. There is mild asymmetric left ventricular  hypertrophy of the basal-septal segment.  Left ventricular diastolic parameters are consistent with Grade I  diastolic dysfunction (impaired relaxation).  2. Right ventricular systolic function is normal. The right ventricular  size is normal. There is normal pulmonary artery systolic pressure.  3. Left atrial size was severely dilated.  4. The mitral valve is normal in structure. Mild mitral valve  regurgitation. No evidence of mitral stenosis. The mean mitral valve  gradient is 4.0 mmHg. Moderate mitral annular calcification.  5. Tricuspid valve regurgitation is moderate.  6. The aortic valve is normal in structure. Aortic valve regurgitation is  not visualized. No aortic stenosis is present.  7. The inferior vena cava is normal in size with greater than 50%  respiratory variability, suggesting right atrial pressure of 3 mmHg.   2014 LHC  Hemodynamics:   LV pressure: 129/14 Aortic pressure: 130/58  Angiography   Left Main: moderate sized, smooth and normal  Left anterior Descending: proximal 40-50% stenosis,  Minor irregularities.  Competitive filling of the LIMA can be seen.  diags are normal  Left Circumflex: moderate in  size, normal   Right Coronary Artery: 80 % proximal stenosis, followed by mid occlusion ( chronic occlusion)  SVG to RCA:  Minor irregularities,  Anastomosis to PDA and PLSA are normal.   LIMA to LAD:  Small, atretic. The is competitive filling of the distal LAD   LV Gram: vigorous LV function.  EF 70%.   Past Medical History:  Diagnosis Date  . AAA (abdominal aortic aneurysm) ([place])    a. s/p stent grafting in 2009.  . Adenomatous colon polyp 05/2001  . Adenomatous duodenal polyp   . Allergy   . Anginal pain ([place])   . COPD (chronic obstructive pulmonary disease) ([place])    pt denies COPD  . Coronary artery disease    a. s/p CABG in 1995;  b. 05/2013 Neg MV, EF 82%;  c. 06/2013 Cath: LM nl, LAD 40-50p, LCX nl, RCA 80p/170m, VG->RCA->PDA->PLSA min irregs, LIMA->LAD atretic, vigorous LV fxn.  . Diabetes mellitus without complication (HCC)    history of, resolved. 2017  . Diverticulosis   . Eczema, dyshidrotic   . Eczema, dyshidrotic 1986   Dr. Mathis Fare  . Emphysema of lung ([place])   . Fatty liver   . Fibromyalgia   . GERD (gastroesophageal reflux disease)   . Hyperlipidemia   . Hypertension   . Migraine   . Myocardial infarction ([place])   . Osteoarthritis   . Osteoporosis    unsure, having a bone scan soon  . Peripheral arterial disease (HCC)    left leg diagnosed by Dr Mare Ferrari  . Renal artery stenosis ([place])    a. 2008 s/p PTA    Past Surgical History:  Procedure Laterality Date  . ABDOMINAL AORTAGRAM N/A 03/06/2014   Procedure: ABDOMINAL AORTAGRAM;  Surgeon: Rosetta Posner, MD;  Location: [facility] CATH LAB;  Service: Cardiovascular;  Laterality: N/A;  . ABDOMINAL AORTIC ANEURYSM REPAIR  2009   stenting  . ABDOMINAL HYSTERECTOMY     Total, 1992  . BREAST BIOPSY    . CARDIAC CATHETERIZATION    . CARDIOVASCULAR STRESS TEST  11/11/2009   normal study  . CARPAL TUNNEL RELEASE  08/1993   left wrist  . CARPAL TUNNEL RELEASE  01/1997   right  . CATARACT EXTRACTION, BILATERAL   2021  . CORONARY ARTERY BYPASS GRAFT  07/1994   Triple by Dr Ceasar Mons  . dental implants    . EYE SURGERY  03/2020   cataract  . HAND SURGERY Right 09/2002   Right hand pulley release  . LEFT HEART CATHETERIZATION WITH CORONARY/GRAFT ANGIOGRAM N/A 06/11/2013   Procedure: LEFT HEART CATHETERIZATION WITH [name];  Surgeon: Thayer Headings, MD;  Location: [facility] CATH LAB;  Service: Cardiovascular;  Laterality: N/A;  . MULTIPLE TOOTH EXTRACTIONS  2000   All upper teeth.  Has full dneture.   [name] RENAL ARTERY STENT  2008  . SHOULDER ARTHROSCOPY WITH ROTATOR CUFF REPAIR Right 09/27/2018   Procedure: Right shoulder mini open rotator cuff repair;  Surgeon: Susa Day, MD;  Location: [facility] ORS;  Service: Orthopedics;  Laterality: Right;  90 mins  . thyroid cyst apiration  05/1997 and 07/1998  . TONSILECTOMY, ADENOIDECTOMY, BILATERAL MYRINGOTOMY AND TUBES  1989  . TONSILLECTOMY    . TOTAL ABDOMINAL HYSTERECTOMY  1992  Allergies  Allergen Reactions  . Anticoagulant Compound Itching and Other (See Comments)    Hands and feet tingle and itch.   Darlin Coco [Valdecoxib] Other (See Comments)    Increased lft's   . Hydrocod Polst-Cpm Polst Er Rash  . Lipitor [Atorvastatin] Other (See Comments)    Leg weakness   . Mevacor [Lovastatin] Other (See Comments)    Muscle weakness   . Plavix [Clopidogrel Bisulfate] Itching and Other (See Comments)    Hands and feet tingle and itch  . Zocor [Simvastatin] Other (See Comments)    Bones hurt   . Doxycycline Diarrhea    N/v/d  . Metoprolol Other (See Comments)    Hair loss  . Morphine And Related Itching and Nausea Only  . Codeine Itching and Rash    Hyper-active  . Lisinopril Cough   No current facility-administered medications on file prior to encounter.   Current Outpatient Medications on File Prior to Encounter  Medication Sig Dispense Refill  . Accu-Chek FastClix Lancets MISC Check blood sugar once daily PRN 100 each 1  .  albuterol (PROVENTIL HFA;VENTOLIN HFA) 108 (90 Base) MCG/ACT inhaler Inhale 2 puffs into the lungs every 4 (four) hours as needed for wheezing or shortness of breath. 1 Inhaler 2  . ALPRAZolam (XANAX) 0.25 MG tablet Take 1 tablet (0.25 mg total) by mouth at bedtime as needed for sleep. 90 tablet 3  . amLODipine (NORVASC) 5 MG tablet TAKE 1 TABLET BY MOUTH EVERY DAY (Patient taking differently: Take 5 mg by mouth daily.) 90 tablet 2  . aspirin EC 81 MG tablet Take 1 tablet (81 mg total) by mouth daily. 90 tablet 3  . atorvastatin (LIPITOR) 80 MG tablet TAKE 1 TABLET BY MOUTH EVERY DAY (Patient taking differently: Take 80 mg by mouth daily.) 90 tablet 3  . augmented betamethasone dipropionate (DIPROLENE-AF) 0.05 % cream Apply 1 application topically daily as needed (rash). For hands  1  . B Complex-Biotin-FA (B-100 COMPLEX PO) Take 1 tablet by mouth at bedtime.     . Budeson-Glycopyrrol-Formoterol (BREZTRI AEROSPHERE) 160-9-4.8 MCG/ACT AERO Inhale 2 puffs into the lungs in the morning and at bedtime. (Patient taking differently: Inhale 1 puff into the lungs daily as needed (shortness og breath).) 10.7 g 5  . ciclopirox (LOPROX) 0.77 % cream Apply 1 application topically daily as needed (hands fungus).     . CORDRAN 4 MCG/SQCM TAPE Apply 1 each topically daily as needed (Rash).     . cycloSPORINE (RESTASIS) 0.05 % ophthalmic emulsion Place 1 drop into both eyes 2 (two) times daily.     [name] gabapentin (NEURONTIN) 300 MG capsule Take 1-2 capsules (300-600 mg total) by mouth 3 (three) times daily. (Patient taking differently: Take 300 mg by mouth 2 (two) times daily.) 540 capsule 4  . glucose blood (ACCU-CHEK SMARTVIEW) test strip CHECK BLOOD SUGAR ONCE DAILY AS NEEDED 100 strip 1  . hydrALAZINE (APRESOLINE) 10 MG tablet TAKE 1 TABLET BY MOUTH THREE TIMES A DAY (Patient taking differently: Take 10 mg by mouth 2 (two) times daily.) 270 tablet 3  . loperamide (IMODIUM) 2 MG capsule Take 1 capsule (2 mg total) by  mouth every 6 (six) hours as needed for diarrhea or loose stools. Also available OTC 30 capsule 0  . losartan-hydrochlorothiazide (HYZAAR) 100-25 MG tablet Take 1 tablet by mouth daily. 90 tablet 3  . meloxicam (MOBIC) 15 MG tablet Take 1 tablet (15 mg total) by mouth daily.    . Multiple Vitamins-Minerals (MULTIVITAMIN  WITH MINERALS) tablet Take 1 tablet by mouth at bedtime.    . nitroGLYCERIN (NITROSTAT) 0.4 MG SL tablet Place 1 tablet (0.4 mg total) under the tongue every 5 (five) minutes as needed for chest pain. (Patient taking differently: Place 0.4 mg under the tongue every 5 (five) minutes as needed for chest pain (max 3 doses).) 25 tablet 11  . nystatin (MYCOSTATIN) 100000 UNIT/ML suspension Take 5 mLs (500,000 Units total) by mouth 4 (four) times daily. 80 mL 0  . omeprazole (PRILOSEC) 40 MG capsule TAKE 1 CAPSULE BY MOUTH TWICE A DAY--NEED APPT FOR FURTHER REFILLS 60 capsule 0  . ondansetron (ZOFRAN) 8 MG tablet Take by mouth every 8 (eight) hours as needed for nausea or vomiting.    [name] oxyCODONE-acetaminophen (PERCOCET) 10-325 MG tablet Take 1 tablet by mouth every 6 (six) hours as needed for pain. (Patient taking differently: Take 1 tablet by mouth daily as needed for pain.) 20 tablet 0  . potassium chloride (KLOR-CON) 10 MEQ tablet Take 1 tablet (10 mEq total) by mouth 2 (two) times daily.    [name] saccharomyces boulardii (FLORASTOR) 250 MG capsule Take 1 capsule (250 mg total) by mouth 2 (two) times daily. Any probiotic available is okay 60 capsule 0  . simethicone (MYLICON) 80 MG chewable tablet Chew 1 tablet (80 mg total) by mouth 4 (four) times daily as needed for flatulence (bloating). also available OTC 30 tablet 0  . torsemide (DEMADEX) 20 MG tablet Take 0.5 tablets (10 mg total) by mouth daily as needed (fluid).    . tretinoin (RETIN-A) 0.1 % cream Apply 1 application topically every other day. In the evening    . VITAMIN D, ERGOCALCIFEROL, PO Take 1,000 Units by mouth every evening.       Social History   Socioeconomic History  . Marital status: Married    Spouse name: Not on file  . Number of children: Not on file  . Years of education: Not on file  . Highest education level: Not on file  Occupational History  . Not on file  Tobacco Use  . Smoking status: Former Smoker    Packs/day: 0.00    Years: 37.00    Pack years: 0.00    Types: Cigarettes    Quit date: 12/07/1995    Years since quitting: 25.1  . Smokeless tobacco: Never Used  Vaping Use  . Vaping Use: Never used  Substance and Sexual Activity  . Alcohol use: Yes    Alcohol/week: 6.0 - 10.0 standard drinks    Types: 6 - 10 Glasses of wine per week    Comment: 2-3 glasses daily  . Drug use: No  . Sexual activity: Not Currently    Partners: Male  Other Topics Concern  . Not on file  Social History Narrative   Lives at home with husband.  Is retired.  Education 4 yrs of college.  No children.   Caffiene 2 cups daily.    Social Determinants of Health   Financial Resource Strain: Low Risk   . Difficulty of Paying Living Expenses: Not hard at all  Food Insecurity: No Food Insecurity  . Worried About Charity fundraiser in the Last Year: Never true  . Ran Out of Food in the Last Year: Never true  Transportation Needs: No Transportation Needs  . Lack of Transportation (Medical): No  . Lack of Transportation (Non-Medical): No  Physical Activity: Insufficiently Active  . Days of Exercise per Week: 4 days  . Minutes  of Exercise per Session: 10 min  Stress: No Stress Concern Present  . Feeling of Stress : Only a little  Social Connections: Moderately Isolated  . Frequency of Communication with Friends and Family: More than three times a week  . Frequency of Social Gatherings with Friends and Family: Three times a week  . Attends Religious Services: Never  . Active Member of Clubs or Organizations: No  . Attends Archivist Meetings: Never  . Marital Status: Married  Human resources officer  Violence: Not At Risk  . Fear of Current or Ex-Partner: No  . Emotionally Abused: No  . Physically Abused: No  . Sexually Abused: No    Family History  Problem Relation Age of Onset  . Heart disease Mother   . Heart attack Mother   . Heart disease Father        before age 71  . Heart attack Father   . Heart disease Sister        before age 40  . Diabetes Sister   . Hyperlipidemia Sister   . Hypertension Sister   . Heart attack Sister   . Liver cancer Sister   . Breast cancer Sister   . Heart disease Brother   . Diabetes Brother   . Hyperlipidemia Brother   . Hypertension Brother   . Heart attack Brother   . AAA (abdominal aortic aneurysm) Brother   . Colon cancer Paternal Grandmother   . Heart disease Brother   . Colon cancer Cousin        first cousin on father's side  . Pancreatic cancer Neg Hx   . Rectal cancer Neg Hx   . Stomach cancer Neg Hx   . Esophageal cancer Neg Hx      Review of Systems: [y] = yes, [ ]  = no       General: Weight gain [] ; Weight loss [ ] ; Anorexia [ ] ; Fatigue [ ] ; Fever [ ] ; Chills [ ] ; Weakness [ ]     Cardiac: Chest pain/pressure [x ]; Resting SOB [ ] ; Exertional SOB [ ] ; Orthopnea [ ] ; Pedal Edema [ ] ; Palpitations [ ] ; Syncope [ ] ; Presyncope [ ] ; Paroxysmal nocturnal dyspnea[ ]     Pulmonary: Cough [ ] ; Wheezing[ ] ; Hemoptysis[ ] ; Sputum [ ] ; Snoring [ ]     GI: Vomiting[ ] ; Dysphagia[ ] ; Melena[ ] ; Hematochezia [ ] ; Heartburn[ ] ; Abdominal pain [ ] ; Constipation [ ] ; Diarrhea [ ] ; BRBPR [ ]     GU: Hematuria[ ] ; Dysuria [ ] ; Nocturia[ ]   Vascular: Pain in legs with walking [ ] ; Pain in feet with lying flat [ ] ; Non-healing sores [ ] ; Stroke [ ] ; TIA [ ] ; Slurred speech [ ] ;    Neuro: Headaches[ ] ; Vertigo[ ] ; Seizures[ ] ; Paresthesias[ ] ;Blurred vision [ ] ; Diplopia [ ] ; Vision changes [ ]     Ortho/Skin: Arthritis [ ] ; Joint pain [ ] ; Muscle pain [ ] ; Joint swelling [ ] ; Back Pain [ ] ; Rash [ ]     Psych: Depression[ ] ; Anxiety[ ]      Heme: Bleeding problems [ ] ; Clotting disorders [ ] ; Anemia [ ]     Endocrine: Diabetes [ ] ; Thyroid dysfunction[ ]   Physical Exam: Blood pressure (!) 125/45, pulse 78, temperature 97.9 F (36.6 C), resp. rate 18, height 5' (1.524 m), weight 59.5 kg, SpO2 97 %.   GENERAL: Patient is afebrile, Vital signs reviewed, Well appearing, Patient appears comfortable, Alert and lucid. EYES: Normal inspection. HEENT:  normocephalic, atraumatic , normal  ENT inspection. ORAL:  Moist NECK:  supple , normal inspection. CARD:  regular rate and rhythm, heart sounds normal. Prior sternal scar from CABG, holosystolic murmur+ RESP:  no respiratory distress, breath sounds normal. ABD: soft, tender to palpation, BS present, soft, no organomegaly or masses . BACK: non-tender. No CVA tenderness. MUSC:  normal ROM, non-tender , no pedal edema . SKIN: color normal, no rash, warm, dry . NEURO: awake & alert, lucid, no motor/sensory deficit. Gait stable. PSYCH: mood/affect normal.   Labs: Lab Results  Component Value Date   BUN 16 01/31/2021   Lab Results  Component Value Date   CREATININE 1.82 (H) 01/31/2021   Lab Results  Component Value Date   NA 136 01/31/2021   K 3.5 01/31/2021   CL 102 01/31/2021   CO2 23 01/31/2021   Lab Results  Component Value Date   TROPONINI <0.30 11/16/2014   Lab Results  Component Value Date   WBC 7.6 01/31/2021   HGB 10.8 (L) 01/31/2021   HCT 31.5 (L) 01/31/2021   MCV 87.5 01/31/2021   PLT 203 01/31/2021   Lab Results  Component Value Date   CHOL 160 06/13/2020   HDL 51.10 06/13/2020   LDLCALC 64 01/01/2020   LDLDIRECT 82.0 06/13/2020   TRIG 294.0 (H) 06/13/2020   CHOLHDL 3 06/13/2020   Lab Results  Component Value Date   ALT 15 01/31/2021   AST 26 01/31/2021   ALKPHOS 47 01/31/2021   BILITOT 0.7 01/31/2021      Radiology:  CXR:  No active process    Signed: Renae Fickle 01/31/2021, 7:17 AM  Personally seen and examined. Agree with  Dr. Rudi Rummage above with the following comments: Briefly 75 yo F with cardiac CP in the setting of diarrhea and AKI Patient notes significant improvement in chest pain- down to minimal CP Exam notable for 2+ R Femoral access, hyperactive BS, CTAB Labs notable for AKI Would recommend  NSTEMI- ASA, Heparin drip, BB continued hold diuretics and anti HTN Patient defers BGC checks Giving 1 L IVF Repeat Echo pending DVT Prop Heparin Full Code Labs ordered (CBC, BMP) Based on echo and AKI; will decide timing of LHC (intpatient vs outpatient)  Werner Lean, MD

## 2021-01-31 NOTE — ED Notes (Signed)
Report given to 3E. Pt ready for transport.

## 2021-01-31 NOTE — Progress Notes (Deleted)
Cardiology Office Note   Date:  01/31/2021   ID:  Megan [name], Megan [name] August 31, 1946, MRN [id number]  PCP:  [name], MD  Cardiologist: Embrie Mikkelsen [place] MD  No chief complaint on file.     History of Present Illness: Megan [name] is a 75 y.o. female who is seen for follow up CAD.  She has a history  of CAD, HTN, HL, and PAD.   She is status post coronary artery bypass graft surgery in 1995. She had a renal artery stent for renal artery stenosis in 2008. She had a stent graft of an abdominal aortic aneurysm 2009. She is s/p stenting of the left iliac. She is followed by [name] for PAD. Last CT in May 2018. She had [place] in June 2019 showing no endoleak.    She was hospitalized in July 2014 and had cardiac catheterization which showed that her grafts were open. EF normal.  She was hospitalized 11/15/2014 with which he describes as an aching sensation in her chest.  She underwent a treadmill Myoview on 11/17/14 which showed no ischemia and her ejection fraction was 86%.   In June 2019 she had Abdominal US at [facility] showing AAA of 4 cm.   In October 2019 she underwent right rotator cuff repair. No complications.   She was  having progressive issues with her low back that limited her activity and caused pain. She has exhausted conservative measures and underwent lumbar fusion in July by Dr Ronnald Ramp.   She states she is doing well from a cardiac standpoint. She is still having a lot of back pain. Planning another injection. Some dyspnea walking up hills.  No chest pain or palpitations. No claudication. Notes her husband has been diagnosed with PSP and is declining. She had recent follow up with Dr Donzetta Matters and CT was stable.    Past Medical History:  Diagnosis Date  . AAA (abdominal aortic aneurysm) ([place])    a. s/p stent grafting in 2009.  . Adenomatous colon polyp 05/2001  . Adenomatous duodenal polyp   . Allergy   . Anginal pain ([place])   . COPD (chronic obstructive pulmonary  disease) ([place])    pt denies COPD  . Coronary artery disease    a. s/p CABG in 1995;  b. 05/2013 Neg MV, EF 82%;  c. 06/2013 Cath: LM nl, LAD 40-50p, LCX nl, RCA 80p/121m, VG->RCA->PDA->PLSA min irregs, LIMA->LAD atretic, vigorous LV fxn.  . Diabetes mellitus without complication (HCC)    history of, resolved. 2017  . Diverticulosis   . Eczema, dyshidrotic   . Eczema, dyshidrotic 1986   Dr. Mathis Fare  . Emphysema of lung ([place])   . Fatty liver   . Fibromyalgia   . GERD (gastroesophageal reflux disease)   . Hyperlipidemia   . Hypertension   . Migraine   . Myocardial infarction ([place])   . Osteoarthritis   . Osteoporosis    unsure, having a bone scan soon  . Peripheral arterial disease (HCC)    left leg diagnosed by Dr Mare Ferrari  . Renal artery stenosis ([place])    a. 2008 s/p PTA    Past Surgical History:  Procedure Laterality Date  . ABDOMINAL AORTAGRAM N/A 03/06/2014   Procedure: ABDOMINAL AORTAGRAM;  Surgeon: Rosetta Posner, MD;  Location: [facility] CATH LAB;  Service: Cardiovascular;  Laterality: N/A;  . ABDOMINAL AORTIC ANEURYSM REPAIR  2009   stenting  . ABDOMINAL HYSTERECTOMY     Total, 1992  . BREAST  BIOPSY    . CARDIAC CATHETERIZATION    . CARDIOVASCULAR STRESS TEST  11/11/2009   normal study  . CARPAL TUNNEL RELEASE  08/1993   left wrist  . CARPAL TUNNEL RELEASE  01/1997   right  . CATARACT EXTRACTION, BILATERAL  2021  . CORONARY ARTERY BYPASS GRAFT  07/1994   Triple by Dr Ceasar Mons  . dental implants    . EYE SURGERY  03/2020   cataract  . HAND SURGERY Right 09/2002   Right hand pulley release  . LEFT HEART CATHETERIZATION WITH CORONARY/GRAFT ANGIOGRAM N/A 06/11/2013   Procedure: LEFT HEART CATHETERIZATION WITH [name];  Surgeon: Thayer Headings, MD;  Location: [facility] CATH LAB;  Service: Cardiovascular;  Laterality: N/A;  . MULTIPLE TOOTH EXTRACTIONS  2000   All upper teeth.  Has full dneture.   [name] RENAL ARTERY STENT  2008  . SHOULDER ARTHROSCOPY WITH ROTATOR  CUFF REPAIR Right 09/27/2018   Procedure: Right shoulder mini open rotator cuff repair;  Surgeon: Susa Day, MD;  Location: [facility] ORS;  Service: Orthopedics;  Laterality: Right;  90 mins  . thyroid cyst apiration  05/1997 and 07/1998  . TONSILECTOMY, ADENOIDECTOMY, BILATERAL MYRINGOTOMY AND TUBES  1989  . TONSILLECTOMY    . TOTAL ABDOMINAL HYSTERECTOMY  1992     No current facility-administered medications for this visit.   No current outpatient medications on file.   Facility-Administered Medications Ordered in Other Visits  Medication Dose Route Frequency Provider Last Rate Last Admin  . 0.9 %  sodium chloride infusion   Intravenous Continuous Renae Fickle, MD 75 mL/hr at 01/31/21 0748 New Bag at 01/31/21 0748  . acetaminophen (TYLENOL) tablet 650 mg  650 mg Oral Q4H PRN Renae Fickle, MD      . albuterol (VENTOLIN HFA) 108 (90 Base) MCG/ACT inhaler 2 puff  2 puff Inhalation Q4H PRN Renae Fickle, MD      . ALPRAZolam Duanne Moron) tablet 0.25 mg  0.25 mg Oral QHS PRN Renae Fickle, MD      . aspirin chewable tablet 324 mg  324 mg Oral Once Renae Fickle, MD      . Derrill Memo ON 02/01/2021] aspirin EC tablet 81 mg  81 mg Oral Daily Renae Fickle, MD      . atorvastatin (LIPITOR) tablet 80 mg  80 mg Oral Daily Renae Fickle, MD      . gabapentin (NEURONTIN) capsule 300 mg  300 mg Oral BID Renae Fickle, MD      . heparin ADULT infusion 100 units/mL (25000 units/232mL)  700 Units/hr Intravenous Continuous Renae Fickle, MD 7 mL/hr at 01/31/21 0626 700 Units/hr at 01/31/21 7017  . hydrALAZINE (APRESOLINE) tablet 10 mg  10 mg Oral TID Renae Fickle, MD      . insulin aspart (novoLOG) injection 0-9 Units  0-9 Units Subcutaneous TID [facility] Renae Fickle, MD      . metoprolol tartrate (LOPRESSOR) tablet 12.5 mg  12.5 mg Oral BID Renae Fickle, MD      . nitroGLYCERIN (NITROSTAT) SL tablet 0.4 mg  0.4 mg Sublingual Q5 min PRN Renae Fickle, MD      . ondansetron  [facility]) injection 4 mg  4 mg Intravenous Q6H PRN Renae Fickle, MD      . simethicone [facility]) chewable tablet 80 mg  80 mg Oral QID PRN Renae Fickle, MD        Allergies:   Anticoagulant compound, Bextra [valdecoxib], Hydrocod polst-cpm polst er, Lipitor [atorvastatin], Mevacor [lovastatin], Plavix [clopidogrel bisulfate], Zocor [simvastatin],  Doxycycline, Metoprolol, Morphine and related, Codeine, and Lisinopril    Social History:  The patient  reports that she quit smoking about 25 years ago. Her smoking use included cigarettes. She smoked 0.00 packs per day for 37.00 years. She has never used smokeless tobacco. She reports current alcohol use of about 6.0 - 10.0 standard drinks of alcohol per week. She reports that she does not use drugs.   Family History:  The patient's family history includes AAA (abdominal aortic aneurysm) in her brother; Breast cancer in her sister; Colon cancer in her cousin and paternal grandmother; Diabetes in her brother and sister; Heart attack in her brother, father, mother, and sister; Heart disease in her brother, brother, father, mother, and sister; Hyperlipidemia in her brother and sister; Hypertension in her brother and sister; Liver cancer in her sister.    ROS:  Please see the history of present illness.   Otherwise, review of systems are positive for none.   All other systems are reviewed and negative.    PHYSICAL EXAM: VS:  There were no vitals taken for this visit. , BMI There is no height or weight on file to calculate BMI. GENERAL:  Overweight  WF in NAD HEENT:  PERRL, EOMI, sclera are clear. Oropharynx is clear. NECK:  No jugular venous distention, carotid upstroke brisk and symmetric, no bruits, no thyromegaly or adenopathy LUNGS:  Clear to auscultation bilaterally CHEST:  Unremarkable HEART:  RRR,  PMI not displaced or sustained,S1 and S2 within normal limits, no S3, no S4: no clicks, no rubs, no murmurs ABD:  Soft, nontender. BS +, no  masses or bruits. No hepatomegaly, no splenomegaly EXT:  1 + pulses throughout, no edema, no cyanosis no clubbing SKIN:  Warm and dry.  No rashes NEURO:  Alert and oriented x 3. Cranial nerves II through XII intact. PSYCH:  Cognitively intact     EKG:  EKG is not done today.     Recent Labs: 06/13/2020: TSH 1.81 01/08/2021: B Natriuretic Peptide 862.1 01/13/2021: Magnesium 1.7 01/31/2021: ALT 15; BUN 16; Creatinine, Ser 1.82; Hemoglobin 10.8; Platelets 203; Potassium 3.5; Sodium 136   Labs from primary care 12/15/16: A1c 5.6%. : Cholesterol 167 Trig-209, HDL 52, LDL 73.  AST 51, ALT 65. Normal renal indices. TSH is normal. Dated 06/24/17: CRP <0.3. Glucose 120, AST 58, ALT 80, other chemistries normal.  Dated 09/28/18: normal CBC, CMET  Lipid Panel    Component Value Date/Time   CHOL 160 06/13/2020 1017   CHOL 154 01/01/2020 0835   TRIG 294.0 (H) 06/13/2020 1017   HDL 51.10 06/13/2020 1017   HDL 50 01/01/2020 0835   CHOLHDL 3 06/13/2020 1017   VLDL 58.8 (H) 06/13/2020 1017   LDLCALC 64 01/01/2020 0835   LDLDIRECT 82.0 06/13/2020 1017      Wt Readings from Last 3 Encounters:  01/31/21 127 lb 11.2 oz (57.9 kg)  01/19/21 131 lb 3.2 oz (59.5 kg)  11/05/20 135 lb (61.2 kg)      ASSESSMENT AND PLAN:  1.CAD status post CABG in 1995. Most recent cardiac cath in July 2014 showed no recurrent stenoses and patent grafts. Most recent treadmill Myoview 11/17/14 normal with ejection fraction 86%. She has no significant symptoms of angina. No CHF. She notes that prior stress tests really haven't been very helpful.  2. HTN with hypertensive heart disease. BP is under  good control. Often forgets to take mid day hydralazine 3. Hypercholesterolemia- LDL is at goal but triglycerides persistently elevated. Np changed  with Vascepa and this was too expensive.  4.  Status post stent to right renal artery 2008. Patent by CT in October 5. Status post stent graft to abdominal aortic aneurysm 2009.   S/p iliac stent in 2016. Stable by CT in October 6. Peripheral artery disease followed by Dr. Donzetta Matters 7. Lumbar spine disease. S/p fusion with persistent pain.  Follow up in 6 months with labs   Signed, Koda Routon [place] MD, [facility]    01/31/2021 10:51 AM    [place]

## 2021-01-31 NOTE — Progress Notes (Signed)
  Echocardiogram 2D Echocardiogram has been performed.  Megan [name] 01/31/2021, 6:02 PM

## 2021-01-31 NOTE — Progress Notes (Addendum)
[place] for heparin Indication: chest pain/ACS  Allergies  Allergen Reactions  . Anticoagulant Compound Itching and Other (See Comments)    Hands and feet tingle and itch.   Darlin Coco [Valdecoxib] Other (See Comments)    Increased lft's   . Hydrocod Polst-Cpm Polst Er Rash  . Lipitor [Atorvastatin] Other (See Comments)    Leg weakness   . Mevacor [Lovastatin] Other (See Comments)    Muscle weakness   . Plavix [Clopidogrel Bisulfate] Itching and Other (See Comments)    Hands and feet tingle and itch  . Zocor [Simvastatin] Other (See Comments)    Bones hurt   . Doxycycline Diarrhea    N/v/d  . Metoprolol Other (See Comments)    Hair loss  . Morphine And Related Itching and Nausea Only  . Codeine Itching and Rash    Hyper-active  . Lisinopril Cough    Patient Measurements: Height: 5' (152.4 cm) Weight: 57.9 kg (127 lb 11.2 oz) IBW/kg (Calculated) : 45.5 Heparin Dosing Weight: 57.7 kg  Vital Signs: Temp: 98.5 F (36.9 C) (02/26 1928) Temp Source: Oral (02/26 1928) BP: 124/55 (02/26 2109) Pulse Rate: 77 (02/26 2109)  Labs: Recent Labs    01/31/21 0445 01/31/21 0600 01/31/21 0714 01/31/21 0944 01/31/21 1309 01/31/21 2159  HGB  --  10.8*  --   --   --   --   HCT  --  31.5*  --   --   --   --   PLT  --  203  --   --   --   --   HEPARINUNFRC  --   --   --   --  <0.10* 0.16*  CREATININE 1.82*  --   --   --   --   --   TROPONINIHS 618*  --  881* 1,085*  --   --     Estimated Creatinine Clearance: 21 mL/min (A) (by C-G formula based on SCr of 1.82 mg/dL (H)).   Medical History: Past Medical History:  Diagnosis Date  . AAA (abdominal aortic aneurysm) ([place])    a. s/p stent grafting in 2009.  . Adenomatous colon polyp 05/2001  . Adenomatous duodenal polyp   . Allergy   . Anginal pain ([place])   . COPD (chronic obstructive pulmonary disease) ([place])    pt denies COPD  . Coronary artery disease    a. s/p CABG in 1995;  b.  05/2013 Neg MV, EF 82%;  c. 06/2013 Cath: LM nl, LAD 40-50p, LCX nl, RCA 80p/135m, VG->RCA->PDA->PLSA min irregs, LIMA->LAD atretic, vigorous LV fxn.  . Diabetes mellitus without complication (HCC)    history of, resolved. 2017  . Diverticulosis   . Eczema, dyshidrotic   . Eczema, dyshidrotic 1986   Dr. Mathis Fare  . Emphysema of lung ([place])   . Fatty liver   . Fibromyalgia   . GERD (gastroesophageal reflux disease)   . Hyperlipidemia   . Hypertension   . Migraine   . Myocardial infarction ([place])   . Osteoarthritis   . Osteoporosis    unsure, having a bone scan soon  . Peripheral arterial disease (HCC)    left leg diagnosed by Dr Mare Ferrari  . Renal artery stenosis ([place])    a. 2008 s/p PTA    Medications:  See medication history  Assessment: 42 yoF with CP to start heparin. She has allergy to anticoagulant compound but not a serious reaction.  ~7hr f/u level undetectable. Per  RN, line has occluded occasionally. Will give a repeat bolus and increase rate. No s/sx bleeding noted, Hgb ~11 and PLT ~200.    2/26 PM update:  Heparin level low but trending up No issues per RN  Goal of Therapy:  Heparin level 0.3-0.7 units/ml Monitor platelets by anticoagulation protocol: Yes   Plan:  Heparin 2000 units bolus Inc heparin to 900 units/hr Re-check heparin level in 8 hours  Narda Bonds, PharmD, [place] Pharmacist Phone: [phone]

## 2021-01-31 NOTE — Progress Notes (Signed)
ANTICOAGULATION CONSULT NOTE - Initial Consult  Pharmacy Consult for heparin Indication: chest pain/ACS  Allergies  Allergen Reactions  . Anticoagulant Compound Itching and Other (See Comments)    Hands and feet tingle and itch.   Darlin Coco [Valdecoxib] Other (See Comments)    Increased lft's   . Hydrocod Polst-Cpm Polst Er Rash  . Lipitor [Atorvastatin] Other (See Comments)    Leg weakness   . Mevacor [Lovastatin] Other (See Comments)    Muscle weakness   . Plavix [Clopidogrel Bisulfate] Itching and Other (See Comments)    Hands and feet tingle and itch  . Zocor [Simvastatin] Other (See Comments)    Bones hurt   . Doxycycline Diarrhea    N/v/d  . Metoprolol Other (See Comments)    Hair loss  . Morphine And Related Itching and Nausea Only  . Codeine Itching and Rash    Hyper-active  . Lisinopril Cough    Patient Measurements: Height: 5' (152.4 cm) Weight: 59.5 kg (131 lb 2.8 oz) IBW/kg (Calculated) : 45.5 Heparin Dosing Weight: 57.7 kg  Vital Signs: Temp: 97.9 F (36.6 C) (02/26 0414) BP: 113/49 (02/26 0530) Pulse Rate: 77 (02/26 0530)  Labs: Recent Labs    01/31/21 0445  CREATININE 1.82*  TROPONINIHS 618*    Estimated Creatinine Clearance: 21.2 mL/min (A) (by C-G formula based on SCr of 1.82 mg/dL (H)).   Medical History: Past Medical History:  Diagnosis Date  . AAA (abdominal aortic aneurysm) ([place])    a. s/p stent grafting in 2009.  . Adenomatous colon polyp 05/2001  . Adenomatous duodenal polyp   . Allergy   . Anginal pain ([place])   . COPD (chronic obstructive pulmonary disease) ([place])    pt denies COPD  . Coronary artery disease    a. s/p CABG in 1995;  b. 05/2013 Neg MV, EF 82%;  c. 06/2013 Cath: LM nl, LAD 40-50p, LCX nl, RCA 80p/141m, VG->RCA->PDA->PLSA min irregs, LIMA->LAD atretic, vigorous LV fxn.  . Diabetes mellitus without complication (HCC)    history of, resolved. 2017  . Diverticulosis   . Eczema, dyshidrotic   . Eczema, dyshidrotic  1986   Dr. Mathis Fare  . Emphysema of lung ([place])   . Fatty liver   . Fibromyalgia   . GERD (gastroesophageal reflux disease)   . Hyperlipidemia   . Hypertension   . Migraine   . Myocardial infarction ([place])   . Osteoarthritis   . Osteoporosis    unsure, having a bone scan soon  . Peripheral arterial disease (HCC)    left leg diagnosed by Dr Mare Ferrari  . Renal artery stenosis ([place])    a. 2008 s/p PTA    Medications:  See medication history  Assessment: 75 yo lady with CP to start heparin. She has allergy to anticoagulant compound but not a serious reaction. Goal of Therapy:  Heparin level 0.3-0.7 units/ml Monitor platelets by anticoagulation protocol: Yes   Plan:  Heparin bolus 2500 units and drip at 700 units/hr Check heparin level 6-8 hours after start Daily HL and CBC Monitor for bleeding complications  Excell Seltzer Poteet 01/31/2021,6:02 AM

## 2021-01-31 NOTE — ED Notes (Signed)
Critical trop relayed to Dr. Dayna Barker. Will await further orders.

## 2021-02-01 LAB — BASIC METABOLIC PANEL
Anion gap: 12 (ref 5–15)
BUN: 9 mg/dL (ref 8–23)
CO2: 23 mmol/L (ref 22–32)
Calcium: 8.7 mg/dL — ABNORMAL LOW (ref 8.9–10.3)
Chloride: 104 mmol/L (ref 98–111)
Creatinine, Ser: 1.08 mg/dL — ABNORMAL HIGH (ref 0.44–1.00)
GFR, Estimated: 53 mL/min — ABNORMAL LOW (ref >60)
Glucose, Bld: 143 mg/dL — ABNORMAL HIGH (ref 70–99)
Potassium: 3.3 mmol/L — ABNORMAL LOW (ref 3.5–5.1)
Sodium: 139 mmol/L (ref 135–145)

## 2021-02-01 LAB — CBC
HCT: 33.9 % — ABNORMAL LOW (ref 36.0–46.0)
Hemoglobin: 11 g/dL — ABNORMAL LOW (ref 12.0–15.0)
MCH: 28.5 pg (ref 26.0–34.0)
MCHC: 32.4 g/dL (ref 30.0–36.0)
MCV: 87.8 fL (ref 80.0–100.0)
Platelets: 212 10*3/uL (ref 150–400)
RBC: 3.86 MIL/uL — ABNORMAL LOW (ref 3.87–5.11)
RDW: 13.5 % (ref 11.5–15.5)
WBC: 6.4 10*3/uL (ref 4.0–10.5)
nRBC: 0 % (ref 0.0–0.2)

## 2021-02-01 LAB — ECHOCARDIOGRAM COMPLETE
Area-P 1/2: 3.37 cm2
Height: 60 in
S' Lateral: 3.2 cm
Single Plane A4C EF: 50.8 %
Weight: 2043.2 oz

## 2021-02-01 LAB — GLUCOSE, CAPILLARY: Glucose-Capillary: 90 mg/dL (ref 70–99)

## 2021-02-01 LAB — HEPARIN LEVEL (UNFRACTIONATED)
Heparin Unfractionated: 0.17 IU/mL — ABNORMAL LOW (ref 0.30–0.70)
Heparin Unfractionated: 0.24 IU/mL — ABNORMAL LOW (ref 0.30–0.70)

## 2021-02-01 MED ORDER — SODIUM CHLORIDE 0.9 % WEIGHT BASED INFUSION
1.0000 mL/kg/h | INTRAVENOUS | Status: DC
  Administered 2021-02-02: 1 mL/kg/h via INTRAVENOUS

## 2021-02-01 MED ORDER — OXYCODONE-ACETAMINOPHEN 7.5-325 MG PO TABS
1.0000 | ORAL_TABLET | ORAL | Status: DC | PRN
  Administered 2021-02-01 – 2021-02-02 (×2): 1 via ORAL
  Filled 2021-02-01 (×2): qty 1

## 2021-02-01 MED ORDER — POTASSIUM CHLORIDE 20 MEQ PO PACK
40.0000 meq | PACK | Freq: Once | ORAL | Status: DC
  Filled 2021-02-01: qty 2

## 2021-02-01 MED ORDER — POTASSIUM CHLORIDE CRYS ER 10 MEQ PO TBCR
40.0000 meq | EXTENDED_RELEASE_TABLET | Freq: Once | ORAL | Status: AC
  Administered 2021-02-01: 40 meq via ORAL
  Filled 2021-02-01: qty 4

## 2021-02-01 MED ORDER — SODIUM CHLORIDE 0.9% FLUSH: 3.0000 mL | INTRAVENOUS | Status: DC | PRN

## 2021-02-01 MED ORDER — ACETAMINOPHEN 325 MG PO TABS: 650.0000 mg | ORAL_TABLET | Freq: Four times a day (QID) | ORAL | Status: DC | PRN

## 2021-02-01 MED ORDER — ASPIRIN EC 81 MG PO TBEC
81.0000 mg | DELAYED_RELEASE_TABLET | Freq: Every day | ORAL | Status: DC
Start: 2021-02-03 — End: 2021-02-03
  Administered 2021-02-03: 81 mg via ORAL
  Filled 2021-02-01: qty 1

## 2021-02-01 MED ORDER — POTASSIUM CHLORIDE CRYS ER 20 MEQ PO TBCR
40.0000 meq | EXTENDED_RELEASE_TABLET | Freq: Once | ORAL | Status: DC
  Filled 2021-02-01: qty 2

## 2021-02-01 MED ORDER — SODIUM CHLORIDE 0.9 % IV SOLN: 250.0000 mL | INTRAVENOUS | Status: DC | PRN

## 2021-02-01 MED ORDER — MAGIC MOUTHWASH
1.0000 mL | Freq: Three times a day (TID) | ORAL | Status: DC | PRN
  Filled 2021-02-01: qty 5

## 2021-02-01 MED ORDER — ASPIRIN 81 MG PO CHEW
81.0000 mg | CHEWABLE_TABLET | ORAL | Status: AC
  Administered 2021-02-02: 81 mg via ORAL
  Filled 2021-02-01: qty 1

## 2021-02-01 MED ORDER — SODIUM CHLORIDE 0.9 % WEIGHT BASED INFUSION
3.0000 mL/kg/h | INTRAVENOUS | Status: DC
  Administered 2021-02-02: 3 mL/kg/h via INTRAVENOUS

## 2021-02-01 MED ORDER — ACETAMINOPHEN 325 MG PO TABS: 650.0000 mg | ORAL_TABLET | ORAL | Status: DC | PRN

## 2021-02-01 MED ORDER — SODIUM CHLORIDE 0.9 % IV BOLUS
1000.0000 mL | Freq: Once | INTRAVENOUS | Status: AC
  Administered 2021-02-01: 1000 mL via INTRAVENOUS

## 2021-02-01 MED ORDER — SODIUM CHLORIDE 0.9% FLUSH: 3.0000 mL | Freq: Two times a day (BID) | INTRAVENOUS | Status: DC

## 2021-02-01 NOTE — Progress Notes (Signed)
[place] for heparin Indication: chest pain/ACS  Allergies  Allergen Reactions  . Anticoagulant Compound Itching and Other (See Comments)    Hands and feet tingle and itch.   Darlin Coco [Valdecoxib] Other (See Comments)    Increased lft's   . Hydrocod Polst-Cpm Polst Er Rash  . Lipitor [Atorvastatin] Other (See Comments)    Leg weakness   . Mevacor [Lovastatin] Other (See Comments)    Muscle weakness   . Plavix [Clopidogrel Bisulfate] Itching and Other (See Comments)    Hands and feet tingle and itch  . Zocor [Simvastatin] Other (See Comments)    Bones hurt   . Doxycycline Diarrhea    N/v/d  . Metoprolol Other (See Comments)    Hair loss  . Morphine And Related Itching and Nausea Only  . Codeine Itching and Rash    Hyper-active  . Lisinopril Cough    Patient Measurements: Height: 5' (152.4 cm) Weight: 59.6 kg (131 lb 6.4 oz) IBW/kg (Calculated) : 45.5 Heparin Dosing Weight: 57.7 kg  Vital Signs: Temp: 98.6 F (37 C) (02/27 0752) Temp Source: Oral (02/27 0752) BP: 105/53 (02/27 0331) Pulse Rate: 73 (02/27 0331)  Labs: Recent Labs    01/31/21 0445 01/31/21 0600 01/31/21 0714 01/31/21 0944 01/31/21 1309 01/31/21 2159 02/01/21 0710  HGB  --  10.8*  --   --   --   --  11.0*  HCT  --  31.5*  --   --   --   --  33.9*  PLT  --  203  --   --   --   --  212  HEPARINUNFRC  --   --   --   --  <0.10* 0.16* 0.17*  CREATININE 1.82*  --   --   --   --   --  1.08*  TROPONINIHS 618*  --  881* 1,085*  --   --   --     Estimated Creatinine Clearance: 35.7 mL/min (A) (by C-G formula based on SCr of 1.08 mg/dL (H)).   Medical History: Past Medical History:  Diagnosis Date  . AAA (abdominal aortic aneurysm) ([place])    a. s/p stent grafting in 2009.  . Adenomatous colon polyp 05/2001  . Adenomatous duodenal polyp   . Allergy   . Anginal pain ([place])   . COPD (chronic obstructive pulmonary disease) ([place])    pt denies COPD  .  Coronary artery disease    a. s/p CABG in 1995;  b. 05/2013 Neg MV, EF 82%;  c. 06/2013 Cath: LM nl, LAD 40-50p, LCX nl, RCA 80p/154m, VG->RCA->PDA->PLSA min irregs, LIMA->LAD atretic, vigorous LV fxn.  . Diabetes mellitus without complication (HCC)    history of, resolved. 2017  . Diverticulosis   . Eczema, dyshidrotic   . Eczema, dyshidrotic 1986   Dr. Mathis Fare  . Emphysema of lung ([place])   . Fatty liver   . Fibromyalgia   . GERD (gastroesophageal reflux disease)   . Hyperlipidemia   . Hypertension   . Migraine   . Myocardial infarction ([place])   . Osteoarthritis   . Osteoporosis    unsure, having a bone scan soon  . Peripheral arterial disease (HCC)    left leg diagnosed by Dr Mare Ferrari  . Renal artery stenosis ([place])    a. 2008 s/p PTA    Medications:  See medication history  Assessment: 48 yoF with CP to start heparin. She has allergy to anticoagulant compound but  not a serious reaction.  8-hr f/u heparin level remains subtherapeutic at 0.17. Per RN line has occluded occasionally otherwise no problems reported. No s/sx bleeding noted and CBC is stable.   Goal of Therapy:  Heparin level 0.3-0.7 units/ml Monitor platelets by anticoagulation protocol: Yes   Plan:  Increase heparin to 1,050 units/hr Re-check heparin level in 8 hours Monitor daily CBC, s/sx bleeding Follow plans for heart cath  Mercy Riding, PharmD PGY1 Acute Care Pharmacy Resident Please refer to [facility] for unit-specific pharmacist

## 2021-02-01 NOTE — Progress Notes (Signed)
[place] for heparin Indication: chest pain/ACS  Allergies  Allergen Reactions  . Anticoagulant Compound Itching and Other (See Comments)    Hands and feet tingle and itch.   Darlin Coco [Valdecoxib] Other (See Comments)    Increased lft's   . Hydrocod Polst-Cpm Polst Er Rash  . Lipitor [Atorvastatin] Other (See Comments)    Leg weakness   . Mevacor [Lovastatin] Other (See Comments)    Muscle weakness   . Plavix [Clopidogrel Bisulfate] Itching and Other (See Comments)    Hands and feet tingle and itch  . Zocor [Simvastatin] Other (See Comments)    Bones hurt   . Doxycycline Diarrhea    N/v/d  . Metoprolol Other (See Comments)    Hair loss  . Morphine And Related Itching and Nausea Only  . Codeine Itching and Rash    Hyper-active  . Lisinopril Cough    Patient Measurements: Height: 5' (152.4 cm) Weight: 59.6 kg (131 lb 6.4 oz) IBW/kg (Calculated) : 45.5 Heparin Dosing Weight: 57.7 kg  Vital Signs: Temp: 98.2 F (36.8 C) (02/27 1554) Temp Source: Oral (02/27 1554) BP: 111/65 (02/27 1554) Pulse Rate: 78 (02/27 1554)  Labs: Recent Labs    01/31/21 0445 01/31/21 0600 01/31/21 0714 01/31/21 0944 01/31/21 1309 01/31/21 2159 02/01/21 0710 02/01/21 1630  HGB  --  10.8*  --   --   --   --  11.0*  --   HCT  --  31.5*  --   --   --   --  33.9*  --   PLT  --  203  --   --   --   --  212  --   HEPARINUNFRC  --   --   --   --    < > 0.16* 0.17* 0.24*  CREATININE 1.82*  --   --   --   --   --  1.08*  --   TROPONINIHS 618*  --  881* 1,085*  --   --   --   --    < > = values in this interval not displayed.    Estimated Creatinine Clearance: 35.7 mL/min (A) (by C-G formula based on SCr of 1.08 mg/dL (H)).  Assessment: 42 yoF with CP to start heparin. She has allergy to anticoagulant compound but not a serious reaction.  Heparin level remains subtherapeutic (0.24) on gtt at 1050 units/hr. No issues with line or bleeding reported  per RN.  Goal of Therapy:  Heparin level 0.3-0.7 units/ml Monitor platelets by anticoagulation protocol: Yes   Plan:  Increase heparin to 1150 units/hr Heparin level in 8 hours  Sherlon Handing, PharmD, BCPS Please see amion for complete clinical pharmacist phone list 02/01/2021 5:39 PM

## 2021-02-01 NOTE — Progress Notes (Signed)
Progress Note  Patient Name: Megan [name] Date of Encounter: 02/01/2021  Primary Cardiologist: Megan Martinique, MD   Subjective   Patient notes that she is doing OK.  Since day prior notes PM Chest pain and tightness .  Relevant interval testing or therapy include improvement in kidney function with IVF and similar echocardiogram.  No SOB/DOE and no PND/Orthopnea.    No palpitations or syncope. Notes that her diarrhea is similar to prior, where she tested negative for C diff.  Notes that the has a outpatient Abdomen with Contrast for Cyst evaluation planned for the coming week.  Notes that she has not have her home oxycodone and her back pain has been worse.   Inpatient Medications    Scheduled Meds: . aspirin  324 mg Oral Once  . aspirin EC  81 mg Oral Daily  . atorvastatin  80 mg Oral Daily  . gabapentin  300 mg Oral BID  . hydrALAZINE  10 mg Oral TID  . insulin aspart  0-9 Units Subcutaneous TID WC  . metoprolol tartrate  12.5 mg Oral BID   Continuous Infusions: . heparin 1,050 Units/hr (02/01/21 0946)   PRN Meds: acetaminophen, albuterol, ALPRAZolam, loperamide, nitroGLYCERIN, ondansetron (ZOFRAN) IV, simethicone   Vital Signs    Vitals:   02/01/21 0014 02/01/21 0331 02/01/21 0752 02/01/21 0948  BP: (!) 142/65 (!) 105/53  (!) 126/54  Pulse: 86 73  71  Resp: 18 18    Temp: (!) 100.4 F (38 C) 98.7 F (37.1 C) 98.6 F (37 C)   TempSrc: Oral Oral Oral   SpO2: 93% 96%    Weight:      Height:        Intake/Output Summary (Last 24 hours) at 02/01/2021 1017 Last data filed at 02/01/2021 0755 Gross per 24 hour  Intake 2302.59 ml  Output 1550 ml  Net 752.59 ml   Filed Weights   01/31/21 0530 01/31/21 0913 02/01/21 0013  Weight: 59.5 kg 57.9 kg 59.6 kg    Telemetry    SR - Personally Reviewed  ECG    SR 75 diffuse TWI 02/01/21 - Personally Reviewed  Physical Exam   GEN: No acute distress.   Neck: No JVD Ears: Bilateral Frank's Sign Cardiac: RRR,  no murmurs, rubs, or gallops.  Respiratory: Clear to auscultation bilaterally. GI: Soft, nontender, non-distended  MS: No edema; No deformity. Neuro:  Nonfocal  Psych: Normal affect   Labs    Chemistry Recent Labs  Lab 01/31/21 0445 02/01/21 0710  NA 136 139  K 3.5 3.3*  CL 102 104  CO2 23 23  GLUCOSE 111* 143*  BUN 16 9  CREATININE 1.82* 1.08*  CALCIUM 8.9 8.7*  PROT 4.9*  --   ALBUMIN 2.4*  --   AST 26  --   ALT 15  --   ALKPHOS 47  --   BILITOT 0.7  --   GFRNONAA 28* 53*  ANIONGAP 11 12     Hematology Recent Labs  Lab 01/27/21 1504 01/31/21 0600 02/01/21 0710  WBC 14.5* 7.6 6.4  RBC 4.16 3.60* 3.86*  HGB 11.9* 10.8* 11.0*  HCT 36.1 31.5* 33.9*  MCV 86.8 87.5 87.8  MCH  --  30.0 28.5  MCHC 32.9 34.3 32.4  RDW 13.7 13.5 13.5  PLT 241.0 203 212    Cardiac EnzymesNo results for input(s): TROPONINI in the last 168 hours. No results for input(s): TROPIPOC in the last 168 hours.   BNPNo results for input(s):  BNP, PROBNP in the last 168 hours.   DDimer No results for input(s): DDIMER in the last 168 hours.   Radiology    DG Chest Portable 1 View  Result Date: 01/31/2021 CLINICAL DATA:  Chest pain EXAM: PORTABLE CHEST 1 VIEW COMPARISON:  01/10/2021 FINDINGS: Lungs are clear. No pneumothorax or pleural effusion. Coronary artery bypass grafting has been performed. Extensive calcification of the mitral valve annulus again noted. Cardiac size within normal limits. Pulmonary vascularity is normal. IMPRESSION: No active disease. Electronically Signed   By: Fidela Salisbury MD   On: 01/31/2021 05:36   ECHOCARDIOGRAM COMPLETE  Result Date: 02/01/2021    ECHOCARDIOGRAM REPORT   Patient Name:   Megan [name] [facility] Date of Exam: 01/31/2021 Medical Rec #:  [id number]           Height:       60.0 in Accession #:    [id number]          Weight:       127.7 lb Date of Birth:  05-09-1946           BSA:          1.543 m Patient Age:    75 years            BP:           125/45 mmHg  Patient Gender: F                   HR:           84 bpm. Exam Location:  Inpatient Procedure: 2D Echo Indications:    NSTEMI  History:        Patient has prior history of Echocardiogram examinations, most                 recent 01/09/2021. CAD, abdominal aortic aneurysm; Risk                 Factors:Dyslipidemia.  Sonographer:    Johny Chess Referring Phys: [id number] ROBIN [place]  1. Left ventricular ejection fraction, by estimation, is 60 to 65%. The left ventricle has normal function. The left ventricle has no regional wall motion abnormalities. Left ventricular diastolic parameters are consistent with Grade I diastolic dysfunction (impaired relaxation).  2. Right ventricular systolic function is normal. The right ventricular size is normal. There is normal pulmonary artery systolic pressure.  3. Left atrial size was severely dilated.  4. The mitral valve is grossly normal. Mild mitral valve regurgitation: Apperance of two jets; study may underestimated regurgitation severity.  5. The aortic valve is tricuspid. Aortic valve regurgitation is not visualized. No aortic stenosis is present.  6. The inferior vena cava is normal in size with greater than 50% respiratory variability, suggesting right atrial pressure of 3 mmHg. Comparison(s): A prior study was performed on 01/09/21. No significant change from prior study. Prior images reviewed side by side. Similar to prior. FINDINGS  Left Ventricle: Left ventricular ejection fraction, by estimation, is 60 to 65%. The left ventricle has normal function. The left ventricle has no regional wall motion abnormalities. The left ventricular internal cavity size was normal in size. There is  no left ventricular hypertrophy. Left ventricular diastolic parameters are consistent with Grade I diastolic dysfunction (impaired relaxation). Right Ventricle: The right ventricular size is normal. No increase in right ventricular wall thickness. Right ventricular systolic  function is normal. There is normal pulmonary artery systolic pressure. The tricuspid regurgitant velocity is 2.74 m/s, and  with an assumed right atrial pressure of 3 mmHg, the estimated right ventricular systolic pressure is 42.3 mmHg. Left Atrium: Left atrial size was severely dilated. Right Atrium: Right atrial size was normal in size. Pericardium: There is no evidence of pericardial effusion. Mitral Valve: The mitral valve is grossly normal. There is mild thickening of the mitral valve leaflet(s). Mild to moderate mitral annular calcification. Mild mitral valve regurgitation. Tricuspid Valve: The tricuspid valve is normal in structure. Tricuspid valve regurgitation is mild. Aortic Valve: The aortic valve is tricuspid. There is mild to moderate aortic valve annular calcification. Aortic valve regurgitation is not visualized. No aortic stenosis is present. Pulmonic Valve: The pulmonic valve was grossly normal. Pulmonic valve regurgitation is not visualized. No evidence of pulmonic stenosis. Aorta: The aortic root and ascending aorta are structurally normal, with no evidence of dilitation. Venous: The inferior vena cava is normal in size with greater than 50% respiratory variability, suggesting right atrial pressure of 3 mmHg. IAS/Shunts: The atrial septum is grossly normal.  LEFT VENTRICLE PLAX 2D LVIDd:         4.40 cm     Diastology LVIDs:         3.20 cm     LV e' medial:    7.62 cm/s LV PW:         0.90 cm     LV E/e' medial:  10.6 LV IVS:        0.90 cm     LV e' lateral:   12.40 cm/s LVOT diam:     1.70 cm     LV E/e' lateral: 6.5 LV SV:         69 LV SV Index:   45 LVOT Area:     2.27 cm  LV Volumes (MOD) LV vol d, MOD A4C: 59.5 ml LV vol s, MOD A4C: 29.3 ml LV SV MOD A4C:     59.5 ml RIGHT VENTRICLE             IVC RV S prime:     12.90 cm/s  IVC diam: 1.50 cm TAPSE (M-mode): 1.7 cm LEFT ATRIUM             Index       RIGHT ATRIUM           Index LA diam:        3.70 cm 2.40 cm/m  RA Area:     11.30 cm  LA Vol (A2C):   60.0 ml 38.90 ml/m RA Volume:   25.70 ml  16.66 ml/m LA Vol (A4C):   77.2 ml 50.05 ml/m LA Biplane Vol: 75.7 ml 49.07 ml/m  AORTIC VALVE LVOT Vmax:   143.00 cm/s LVOT Vmean:  101.000 cm/s LVOT VTI:    0.306 m  AORTA Ao Root diam: 2.90 cm Ao Asc diam:  2.80 cm MITRAL VALVE                TRICUSPID VALVE MV Area (PHT): 3.37 cm     TR Peak grad:   30.0 mmHg MV Decel Time: 225 msec     TR Vmax:        274.00 cm/s MV E velocity: 81.00 cm/s MV A velocity: 128.00 cm/s  SHUNTS MV E/A ratio:  0.63         Systemic VTI:  0.31 m  Systemic Diam: 1.70 cm Rudean Haskell MD Electronically signed by Rudean Haskell MD Signature Date/Time: 02/01/2021/10:17:16 AM    Final     Cardiac Studies   Echo as above  Patient Profile     75 y.o. female history of prior bypass with NSTEMI  Assessment & Plan    Coronary Artery Disease; Obstructive - symptomatic  - anatomy: LM nl, LAD 40-50p, LCX nl, RCA 80p/170m, VG->RCA->PDA->PLSA min irregs, LIMA->LAD atretic - continue ASA 81 mg; continue heparin drip - continue statin - continue BB started 02/01/21 - continue PRN nitrates - Reasonable for LHC:  Persistent CP with NSTEMI; unclear demand component (pressure has been ok while here); discussed that if intervention, this would likely push back invasive evaluation of her diarrhea; patient is amenable to proceed; given issues with arterial tortuosity, Femoral access would be reasonable; patient is amenable - Risks and benefits of cardiac catheterization have been discussed with the patient.  These include bleeding, infection, kidney damage, stroke, heart attack, death.  The patient understands these risks and is willing to proceed.  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; would follow like moderate MR  Hypokalemia -replacing potassium  Diarrhea - stable without evidence of C diff on last  assessment; following up   Returning patient's magic Mouthwash per her request  Back pain - patient asks for her home oxycodone while here; will not get prescription sent for this after and will need to follow up with primary  Full Code Labs Placed Diet- regular per patient request; patient requests DC of glucose checks DVT Proph- heparin as above  For questions or updates, please contact [facility] Please consult [email or web address] for contact info under Cardiology/STEMI.      Signed, Werner Lean, MD  02/01/2021, 10:17 AM

## 2021-02-02 ENCOUNTER — Encounter (HOSPITAL_COMMUNITY)
Admission: EM | Disposition: A | Discharge status: Home / Self Care | Payer: Self-pay | Source: Home / Self Care | Attending: Internal Medicine

## 2021-02-02 ENCOUNTER — Inpatient Hospital Stay: Admission: RE | Admit: 2021-02-02 | Payer: Medicare Other | Source: Ambulatory Visit

## 2021-02-02 ENCOUNTER — Ambulatory Visit: Payer: Medicare Other | Admitting: Cardiology

## 2021-02-02 DIAGNOSIS — I25810 Atherosclerosis of coronary artery bypass graft(s) without angina pectoris: Secondary | ICD-10-CM

## 2021-02-02 DIAGNOSIS — I10 Essential (primary) hypertension: Secondary | ICD-10-CM

## 2021-02-02 DIAGNOSIS — E785 Hyperlipidemia, unspecified: Secondary | ICD-10-CM

## 2021-02-02 HISTORY — PX: CORONARY STENT INTERVENTION: CATH118234

## 2021-02-02 HISTORY — PX: LEFT HEART CATH AND CORS/GRAFTS ANGIOGRAPHY: CATH118250

## 2021-02-02 LAB — POCT ACTIVATED CLOTTING TIME
Activated Clotting Time: 237 seconds
Activated Clotting Time: 255 seconds
Activated Clotting Time: 237 seconds
Activated Clotting Time: 249 seconds

## 2021-02-02 LAB — BASIC METABOLIC PANEL
Anion gap: 9 (ref 5–15)
BUN: 8 mg/dL (ref 8–23)
CO2: 24 mmol/L (ref 22–32)
Calcium: 8.8 mg/dL — ABNORMAL LOW (ref 8.9–10.3)
Chloride: 109 mmol/L (ref 98–111)
Creatinine, Ser: 0.8 mg/dL (ref 0.44–1.00)
GFR, Estimated: >60 mL/min (ref >60)
Glucose, Bld: 95 mg/dL (ref 70–99)
Potassium: 4.4 mmol/L (ref 3.5–5.1)
Sodium: 142 mmol/L (ref 135–145)

## 2021-02-02 LAB — CBC
HCT: 32.3 % — ABNORMAL LOW (ref 36.0–46.0)
Hemoglobin: 10.9 g/dL — ABNORMAL LOW (ref 12.0–15.0)
MCH: 29.5 pg (ref 26.0–34.0)
MCHC: 33.7 g/dL (ref 30.0–36.0)
MCV: 87.5 fL (ref 80.0–100.0)
Platelets: 236 10*3/uL (ref 150–400)
RBC: 3.69 MIL/uL — ABNORMAL LOW (ref 3.87–5.11)
RDW: 13.6 % (ref 11.5–15.5)
WBC: 6.9 10*3/uL (ref 4.0–10.5)
nRBC: 0 % (ref 0.0–0.2)

## 2021-02-02 LAB — GLUCOSE, CAPILLARY
Glucose-Capillary: 86 mg/dL (ref 70–99)
Glucose-Capillary: 109 mg/dL — ABNORMAL HIGH (ref 70–99)
Glucose-Capillary: 125 mg/dL — ABNORMAL HIGH (ref 70–99)

## 2021-02-02 LAB — MAGNESIUM: Magnesium: 1.1 mg/dL — ABNORMAL LOW (ref 1.7–2.4)

## 2021-02-02 LAB — HEPARIN LEVEL (UNFRACTIONATED)
Heparin Unfractionated: 0.29 IU/mL — ABNORMAL LOW (ref 0.30–0.70)
Heparin Unfractionated: 0.48 IU/mL (ref 0.30–0.70)

## 2021-02-02 SURGERY — LEFT HEART CATH AND CORS/GRAFTS ANGIOGRAPHY
Anesthesia: LOCAL

## 2021-02-02 MED ORDER — VERAPAMIL HCL 2.5 MG/ML IV SOLN
INTRAVENOUS | Status: DC | PRN
  Administered 2021-02-02: 10 mL via INTRA_ARTERIAL

## 2021-02-02 MED ORDER — MIDAZOLAM HCL 2 MG/2ML IJ SOLN
INTRAMUSCULAR | Status: AC
  Filled 2021-02-02: qty 2

## 2021-02-02 MED ORDER — TICAGRELOR 90 MG PO TABS
90.0000 mg | ORAL_TABLET | Freq: Two times a day (BID) | ORAL | Status: DC
  Filled 2021-02-02 (×2): qty 1

## 2021-02-02 MED ORDER — LIDOCAINE HCL (PF) 1 % IJ SOLN
INTRAMUSCULAR | Status: AC
  Filled 2021-02-02: qty 30

## 2021-02-02 MED ORDER — IOHEXOL 350 MG/ML SOLN
INTRAVENOUS | Status: DC | PRN
  Administered 2021-02-02: 135 mL

## 2021-02-02 MED ORDER — HEPARIN (PORCINE) IN NACL 1000-0.9 UT/500ML-% IV SOLN
INTRAVENOUS | Status: DC | PRN
  Administered 2021-02-02 (×2): 500 mL

## 2021-02-02 MED ORDER — HEPARIN SODIUM (PORCINE) 1000 UNIT/ML IJ SOLN
INTRAMUSCULAR | Status: AC
  Filled 2021-02-02: qty 1

## 2021-02-02 MED ORDER — TICAGRELOR 90 MG PO TABS
ORAL_TABLET | ORAL | Status: DC | PRN
  Administered 2021-02-02: 180 mg via ORAL

## 2021-02-02 MED ORDER — NITROGLYCERIN 1 MG/10 ML FOR IR/CATH LAB
INTRA_ARTERIAL | Status: DC | PRN
  Administered 2021-02-02 (×2): 200 ug via INTRACORONARY

## 2021-02-02 MED ORDER — SODIUM CHLORIDE 0.9% FLUSH: 3.0000 mL | INTRAVENOUS | Status: DC | PRN

## 2021-02-02 MED ORDER — HEPARIN SODIUM (PORCINE) 1000 UNIT/ML IJ SOLN
INTRAMUSCULAR | Status: DC | PRN
  Administered 2021-02-02: 1000 [IU] via INTRAVENOUS
  Administered 2021-02-02: 3000 [IU] via INTRAVENOUS
  Administered 2021-02-02: 1000 [IU] via INTRAVENOUS
  Administered 2021-02-02: 2000 [IU] via INTRAVENOUS

## 2021-02-02 MED ORDER — FENTANYL CITRATE (PF) 100 MCG/2ML IJ SOLN
INTRAMUSCULAR | Status: DC | PRN
  Administered 2021-02-02 (×3): 25 ug via INTRAVENOUS

## 2021-02-02 MED ORDER — ENOXAPARIN SODIUM 40 MG/0.4ML ~~LOC~~ SOLN: 40.0000 mg | SUBCUTANEOUS | Status: DC

## 2021-02-02 MED ORDER — HEPARIN (PORCINE) IN NACL 1000-0.9 UT/500ML-% IV SOLN
INTRAVENOUS | Status: AC
  Filled 2021-02-02: qty 1000

## 2021-02-02 MED ORDER — NITROGLYCERIN 1 MG/10 ML FOR IR/CATH LAB
INTRA_ARTERIAL | Status: AC
  Filled 2021-02-02: qty 10

## 2021-02-02 MED ORDER — TIROFIBAN HCL IN NACL 5-0.9 MG/100ML-% IV SOLN
INTRAVENOUS | Status: AC | PRN
Start: 2021-02-02 — End: 2021-02-02
  Administered 2021-02-02: 0.075 ug/kg/min via INTRAVENOUS

## 2021-02-02 MED ORDER — FENTANYL CITRATE (PF) 100 MCG/2ML IJ SOLN
INTRAMUSCULAR | Status: AC
  Filled 2021-02-02: qty 2

## 2021-02-02 MED ORDER — SODIUM CHLORIDE 0.9 % IV SOLN: INTRAVENOUS | Status: AC

## 2021-02-02 MED ORDER — TICAGRELOR 90 MG PO TABS
ORAL_TABLET | ORAL | Status: AC
  Filled 2021-02-02: qty 2

## 2021-02-02 MED ORDER — VERAPAMIL HCL 2.5 MG/ML IV SOLN
INTRAVENOUS | Status: AC
  Filled 2021-02-02: qty 2

## 2021-02-02 MED ORDER — SODIUM CHLORIDE 0.9% FLUSH
3.0000 mL | Freq: Two times a day (BID) | INTRAVENOUS | Status: DC
  Administered 2021-02-02 – 2021-02-03 (×2): 3 mL via INTRAVENOUS

## 2021-02-02 MED ORDER — TIROFIBAN (AGGRASTAT) BOLUS VIA INFUSION
INTRAVENOUS | Status: DC | PRN
Start: 2021-02-02 — End: 2021-02-02
  Administered 2021-02-02: 1562.5 ug via INTRAVENOUS

## 2021-02-02 MED ORDER — VERAPAMIL HCL 2.5 MG/ML IV SOLN
INTRAVENOUS | Status: DC | PRN
  Administered 2021-02-02 (×2): 500 ug via INTRACORONARY

## 2021-02-02 MED ORDER — LIDOCAINE HCL (PF) 1 % IJ SOLN
INTRAMUSCULAR | Status: DC | PRN
  Administered 2021-02-02: 2 mL

## 2021-02-02 MED ORDER — MIDAZOLAM HCL 2 MG/2ML IJ SOLN
INTRAMUSCULAR | Status: DC | PRN
  Administered 2021-02-02 (×3): 1 mg via INTRAVENOUS

## 2021-02-02 MED ORDER — SODIUM CHLORIDE 0.9 % IV SOLN: 250.0000 mL | INTRAVENOUS | Status: DC | PRN

## 2021-02-02 MED ORDER — HYDRALAZINE HCL 20 MG/ML IJ SOLN: 10.0000 mg | INTRAMUSCULAR | Status: AC | PRN

## 2021-02-02 SURGICAL SUPPLY — 21 items
BALLN SAPPHIRE 3.0X12 (BALLOONS) ×2
BALLN SAPPHIRE ~~LOC~~ 3.5X18 (BALLOONS) ×2 IMPLANT
BALLOON SAPPHIRE 3.0X12 (BALLOONS) ×1 IMPLANT
CATH INFINITI 5 FR IM (CATHETERS) ×2 IMPLANT
CATH INFINITI 5 FR MPA2 (CATHETERS) ×2 IMPLANT
CATH INFINITI 5FR MULTPACK ANG (CATHETERS) ×2 IMPLANT
CATH VISTA GUIDE 6FR MPA1 (CATHETERS) ×2 IMPLANT
DEVICE RAD TR BAND REGULAR (VASCULAR PRODUCTS) ×2 IMPLANT
DEVICE SPIDERFX EMB PROT 5MM (WIRE) ×2 IMPLANT
GLIDESHEATH SLEND SS 6F .021 (SHEATH) ×2 IMPLANT
GUIDEWIRE INQWIRE 1.5J.035X260 (WIRE) ×1 IMPLANT
INQWIRE 1.5J .035X260CM (WIRE) ×2
KIT ENCORE 26 ADVANTAGE (KITS) ×2 IMPLANT
KIT HEART LEFT (KITS) ×2 IMPLANT
PACK CARDIAC CATHETERIZATION (CUSTOM PROCEDURE TRAY) ×2 IMPLANT
STENT RESOLUTE ONYX 3.0X26 (Permanent Stent) ×2 IMPLANT
STENT RESOLUTE ONYX 3.5X34 (Permanent Stent) ×2 IMPLANT
SYR MEDRAD MARK 7 150ML (SYRINGE) ×2 IMPLANT
TRANSDUCER W/STOPCOCK (MISCELLANEOUS) ×2 IMPLANT
TUBING CIL FLEX 10 FLL-RA (TUBING) ×2 IMPLANT
WIRE RUNTHROUGH .014X180CM (WIRE) ×2 IMPLANT

## 2021-02-02 NOTE — H&P (View-Only) (Signed)
Progress Note  Patient Name: Megan [name] Date of Encounter: 02/02/2021  [facility] Cardiologist: [name], MD   Subjective   Chest pain today.  Chest discomfort awaken her from sleep early Saturday morning and required nitroglycerin for resolution.  Brief recurrence on yesterday.  Pain-free currently.  Prior notes suggest atretic LIMA to LAD.  Patient does not recall ever being told this and I can find no evidence of it in the chart.  Inpatient Medications    Scheduled Meds: . aspirin  324 mg Oral Once  . [START ON 02/03/2021] aspirin EC  81 mg Oral Daily  . atorvastatin  80 mg Oral Daily  . gabapentin  300 mg Oral BID  . hydrALAZINE  10 mg Oral TID  . insulin aspart  0-9 Units Subcutaneous TID WC  . metoprolol tartrate  12.5 mg Oral BID  . sodium chloride flush  3 mL Intravenous Q12H   Continuous Infusions: . sodium chloride    . sodium chloride 1 mL/kg/hr (02/02/21 0508)  . heparin 1,250 Units/hr (02/02/21 0412)   PRN Meds: sodium chloride, acetaminophen, albuterol, ALPRAZolam, loperamide, magic mouthwash, nitroGLYCERIN, ondansetron (ZOFRAN) IV, oxyCODONE-acetaminophen, simethicone, sodium chloride flush   Vital Signs    Vitals:   02/02/21 0011 02/02/21 0012 02/02/21 0342 02/02/21 0744  BP:  136/72 118/62 (!) 145/56  Pulse:  78 74 72  Resp:  (!) 23 (!) 23 18  Temp:  98.1 F (36.7 C) 98.2 F (36.8 C) 98.4 F (36.9 C)  TempSrc:  Oral Oral Oral  SpO2:  92% 91% 96%  Weight: 62.5 kg     Height:        Intake/Output Summary (Last 24 hours) at 02/02/2021 1103 Last data filed at 02/02/2021 7017 Gross per 24 hour  Intake 909.8 ml  Output 1300 ml  Net -390.2 ml   Last 3 Weights 02/02/2021 02/01/2021 01/31/2021  Weight (lbs) 137 lb 11.2 oz 131 lb 6.4 oz 127 lb 11.2 oz  Weight (kg) 62.46 kg 59.603 kg 57.924 kg      Telemetry    Sinus rhythm- Personally Reviewed  ECG    ECG from 02/02/2021 demonstrates sinus rhythm with symmetrical inferior lead and  lateral precordial lead T wave inversion.- Personally Reviewed  Physical Exam  Stable appearing, conversation with friend in the room. GEN: No acute distress.   Neck: No JVD Cardiac: RRR, no murmurs, rubs, or gallops.  2+ bilateral radial pulses. Respiratory: Clear to auscultation bilaterally. GI: Soft, nontender, non-distended  MS: No edema; No deformity. Neuro:  Nonfocal  Psych: Normal affect   Labs    High Sensitivity Troponin:   Recent Labs  Lab 01/31/21 0445 01/31/21 0714 01/31/21 0944  TROPONINIHS 618* 881* 1,085*      Chemistry Recent Labs  Lab 01/31/21 0445 02/01/21 0710 02/02/21 0207  NA 136 139 142  K 3.5 3.3* 4.4  CL 102 104 109  CO2 23 23 24   GLUCOSE 111* 143* 95  BUN 16 9 8   CREATININE 1.82* 1.08* 0.80  CALCIUM 8.9 8.7* 8.8*  PROT 4.9*  --   --   ALBUMIN 2.4*  --   --   AST 26  --   --   ALT 15  --   --   ALKPHOS 47  --   --   BILITOT 0.7  --   --   GFRNONAA 28* 53* >60  ANIONGAP 11 12 9      Hematology Recent Labs  Lab 01/31/21 0600 02/01/21 0710  02/02/21 0207  WBC 7.6 6.4 6.9  RBC 3.60* 3.86* 3.69*  HGB 10.8* 11.0* 10.9*  HCT 31.5* 33.9* 32.3*  MCV 87.5 87.8 87.5  MCH 30.0 28.5 29.5  MCHC 34.3 32.4 33.7  RDW 13.5 13.5 13.6  PLT 203 212 236    BNPNo results for input(s): BNP, PROBNP in the last 168 hours.   DDimer No results for input(s): DDIMER in the last 168 hours.   Radiology    ECHOCARDIOGRAM COMPLETE  Result Date: 02/01/2021    ECHOCARDIOGRAM REPORT   Patient Name:   Megan [name] Date of Exam: 01/31/2021 Medical Rec #:  [id number]           Height:       60.0 in Accession #:    [id number]          Weight:       127.7 lb Date of Birth:  [birth year]           BSA:          1.543 m Patient Age:    75 years            BP:           125/45 mmHg Patient Gender: F                   HR:           84 bpm. Exam Location:  Inpatient Procedure: 2D Echo Indications:    NSTEMI  History:        Patient has prior history of  Echocardiogram examinations, most                 recent 01/09/2021. CAD, abdominal aortic aneurysm; Risk                 Factors:Dyslipidemia.  Sonographer:    Johny Chess Referring Phys: [id number] ROBIN [place]  1. Left ventricular ejection fraction, by estimation, is 60 to 65%. The left ventricle has normal function. The left ventricle has no regional wall motion abnormalities. Left ventricular diastolic parameters are consistent with Grade I diastolic dysfunction (impaired relaxation).  2. Right ventricular systolic function is normal. The right ventricular size is normal. There is normal pulmonary artery systolic pressure.  3. Left atrial size was severely dilated.  4. The mitral valve is grossly normal. Mild mitral valve regurgitation: Apperance of two jets; study may underestimated regurgitation severity.  5. The aortic valve is tricuspid. Aortic valve regurgitation is not visualized. No aortic stenosis is present.  6. The inferior vena cava is normal in size with greater than 50% respiratory variability, suggesting right atrial pressure of 3 mmHg. Comparison(s): A prior study was performed on 01/09/21. No significant change from prior study. Prior images reviewed side by side. Similar to prior. FINDINGS  Left Ventricle: Left ventricular ejection fraction, by estimation, is 60 to 65%. The left ventricle has normal function. The left ventricle has no regional wall motion abnormalities. The left ventricular internal cavity size was normal in size. There is  no left ventricular hypertrophy. Left ventricular diastolic parameters are consistent with Grade I diastolic dysfunction (impaired relaxation). Right Ventricle: The right ventricular size is normal. No increase in right ventricular wall thickness. Right ventricular systolic function is normal. There is normal pulmonary artery systolic pressure. The tricuspid regurgitant velocity is 2.74 m/s, and  with an assumed right atrial pressure of 3 mmHg,  the estimated right ventricular systolic pressure is 28.7 mmHg. Left Atrium: Left atrial  size was severely dilated. Right Atrium: Right atrial size was normal in size. Pericardium: There is no evidence of pericardial effusion. Mitral Valve: The mitral valve is grossly normal. There is mild thickening of the mitral valve leaflet(s). Mild to moderate mitral annular calcification. Mild mitral valve regurgitation. Tricuspid Valve: The tricuspid valve is normal in structure. Tricuspid valve regurgitation is mild. Aortic Valve: The aortic valve is tricuspid. There is mild to moderate aortic valve annular calcification. Aortic valve regurgitation is not visualized. No aortic stenosis is present. Pulmonic Valve: The pulmonic valve was grossly normal. Pulmonic valve regurgitation is not visualized. No evidence of pulmonic stenosis. Aorta: The aortic root and ascending aorta are structurally normal, with no evidence of dilitation. Venous: The inferior vena cava is normal in size with greater than 50% respiratory variability, suggesting right atrial pressure of 3 mmHg. IAS/Shunts: The atrial septum is grossly normal.  LEFT VENTRICLE PLAX 2D LVIDd:         4.40 cm     Diastology LVIDs:         3.20 cm     LV e' medial:    7.62 cm/s LV PW:         0.90 cm     LV E/e' medial:  10.6 LV IVS:        0.90 cm     LV e' lateral:   12.40 cm/s LVOT diam:     1.70 cm     LV E/e' lateral: 6.5 LV SV:         69 LV SV Index:   45 LVOT Area:     2.27 cm  LV Volumes (MOD) LV vol d, MOD A4C: 59.5 ml LV vol s, MOD A4C: 29.3 ml LV SV MOD A4C:     59.5 ml RIGHT VENTRICLE             IVC RV S prime:     12.90 cm/s  IVC diam: 1.50 cm TAPSE (M-mode): 1.7 cm LEFT ATRIUM             Index       RIGHT ATRIUM           Index LA diam:        3.70 cm 2.40 cm/m  RA Area:     11.30 cm LA Vol (A2C):   60.0 ml 38.90 ml/m RA Volume:   25.70 ml  16.66 ml/m LA Vol (A4C):   77.2 ml 50.05 ml/m LA Biplane Vol: 75.7 ml 49.07 ml/m  AORTIC VALVE LVOT Vmax:    143.00 cm/s LVOT Vmean:  101.000 cm/s LVOT VTI:    0.306 m  AORTA Ao Root diam: 2.90 cm Ao Asc diam:  2.80 cm MITRAL VALVE                TRICUSPID VALVE MV Area (PHT): 3.37 cm     TR Peak grad:   30.0 mmHg MV Decel Time: 225 msec     TR Vmax:        274.00 cm/s MV E velocity: 81.00 cm/s MV A velocity: 128.00 cm/s  SHUNTS MV E/A ratio:  0.63         Systemic VTI:  0.31 m                             Systemic Diam: 1.70 cm Rudean Haskell MD Electronically signed by Rudean Haskell MD Signature Date/Time: 02/01/2021/10:17:16 AM    Final  Cardiac Studies   The echocardiogram performed on this admission is unchanged from 1 done 3 weeks prior.  Normal LVEF.  No interval wall motion abnormality.  Patient Profile     75 y.o. female primary patient of Dr. [name], with prior coronary bypass grafting and documented LIMA to LAD, patent vein graft to PDA and PL, patent 50% LAD, normal circumflex, and 100% distal RCA.  Other medical problems include hyperlipidemia, primary hypertension, COPD, chronic diarrhea, and PAD.  Assessment & Plan    1. Non-ST elevation MI: Enzyme elevation and T wave changes on EKG suggests ischemic etiology.  Cannot totally exclude the possibility of Takotsubo.  Rule out progression of LAD and or circumflex native disease.  Rule out bypass grafts obstruction.  Coronary angiography is scheduled for today. 2. Coronary atherosclerosis with prior coronary bypass grafting: Known patent grafts in 2014.  Has sequential SVG to RCA and LIMA to LAD. 3. PAD, asymptomatic at this time. 4. Hyperlipidemia, target LDL less than 70: High intensity statin therapy  The patient was counseled to undergo left heart catheterization, coronary angiography, and possible percutaneous coronary intervention with stent implantation. The procedural risks and benefits were discussed in detail. The risks discussed included death, stroke, myocardial infarction, life-threatening bleeding, limb  ischemia, kidney injury, allergy, and possible emergency cardiac surgery. The risk of these significant complications were estimated to occur less than 1% of the time. After discussion, the patient has agreed to proceed.  For questions or updates, please contact [place] Please consult [email or web address] for contact info under        Signed, Sinclair Grooms, MD  02/02/2021, 11:03 AM

## 2021-02-02 NOTE — Plan of Care (Signed)

## 2021-02-02 NOTE — Progress Notes (Signed)
Progress Note  Patient Name: Megan [name] Date of Encounter: 02/02/2021  [facility] Cardiologist: [name], MD   Subjective   Chest pain today.  Chest discomfort awaken her from sleep early Saturday morning and required nitroglycerin for resolution.  Brief recurrence on yesterday.  Pain-free currently.  Prior notes suggest atretic LIMA to LAD.  Patient does not recall ever being told this and I can find no evidence of it in the chart.  Inpatient Medications    Scheduled Meds: . aspirin  324 mg Oral Once  . [START ON 02/03/2021] aspirin EC  81 mg Oral Daily  . atorvastatin  80 mg Oral Daily  . gabapentin  300 mg Oral BID  . hydrALAZINE  10 mg Oral TID  . insulin aspart  0-9 Units Subcutaneous TID WC  . metoprolol tartrate  12.5 mg Oral BID  . sodium chloride flush  3 mL Intravenous Q12H   Continuous Infusions: . sodium chloride    . sodium chloride 1 mL/kg/hr (02/02/21 0508)  . heparin 1,250 Units/hr (02/02/21 0412)   PRN Meds: sodium chloride, acetaminophen, albuterol, ALPRAZolam, loperamide, magic mouthwash, nitroGLYCERIN, ondansetron (ZOFRAN) IV, oxyCODONE-acetaminophen, simethicone, sodium chloride flush   Vital Signs    Vitals:   02/02/21 0011 02/02/21 0012 02/02/21 0342 02/02/21 0744  BP:  136/72 118/62 (!) 145/56  Pulse:  78 74 72  Resp:  (!) 23 (!) 23 18  Temp:  98.1 F (36.7 C) 98.2 F (36.8 C) 98.4 F (36.9 C)  TempSrc:  Oral Oral Oral  SpO2:  92% 91% 96%  Weight: 62.5 kg     Height:        Intake/Output Summary (Last 24 hours) at 02/02/2021 1103 Last data filed at 02/02/2021 3244 Gross per 24 hour  Intake 909.8 ml  Output 1300 ml  Net -390.2 ml   Last 3 Weights 02/02/2021 02/01/2021 01/31/2021  Weight (lbs) 137 lb 11.2 oz 131 lb 6.4 oz 127 lb 11.2 oz  Weight (kg) 62.46 kg 59.603 kg 57.924 kg      Telemetry    Sinus rhythm- Personally Reviewed  ECG    ECG from 02/02/2021 demonstrates sinus rhythm with symmetrical inferior lead and  lateral precordial lead T wave inversion.- Personally Reviewed  Physical Exam  Stable appearing, conversation with friend in the room. GEN: No acute distress.   Neck: No JVD Cardiac: RRR, no murmurs, rubs, or gallops.  2+ bilateral radial pulses. Respiratory: Clear to auscultation bilaterally. GI: Soft, nontender, non-distended  MS: No edema; No deformity. Neuro:  Nonfocal  Psych: Normal affect   Labs    High Sensitivity Troponin:   Recent Labs  Lab 01/31/21 0445 01/31/21 0714 01/31/21 0944  TROPONINIHS 618* 881* 1,085*      Chemistry Recent Labs  Lab 01/31/21 0445 02/01/21 0710 02/02/21 0207  NA 136 139 142  K 3.5 3.3* 4.4  CL 102 104 109  CO2 23 23 24   GLUCOSE 111* 143* 95  BUN 16 9 8   CREATININE 1.82* 1.08* 0.80  CALCIUM 8.9 8.7* 8.8*  PROT 4.9*  --   --   ALBUMIN 2.4*  --   --   AST 26  --   --   ALT 15  --   --   ALKPHOS 47  --   --   BILITOT 0.7  --   --   GFRNONAA 28* 53* >60  ANIONGAP 11 12 9      Hematology Recent Labs  Lab 01/31/21 0600 02/01/21 0710  02/02/21 0207  WBC 7.6 6.4 6.9  RBC 3.60* 3.86* 3.69*  HGB 10.8* 11.0* 10.9*  HCT 31.5* 33.9* 32.3*  MCV 87.5 87.8 87.5  MCH 30.0 28.5 29.5  MCHC 34.3 32.4 33.7  RDW 13.5 13.5 13.6  PLT 203 212 236    BNPNo results for input(s): BNP, PROBNP in the last 168 hours.   DDimer No results for input(s): DDIMER in the last 168 hours.   Radiology    ECHOCARDIOGRAM COMPLETE  Result Date: 02/01/2021    ECHOCARDIOGRAM REPORT   Patient Name:   Megan [name] [facility] Date of Exam: 01/31/2021 Medical Rec #:  [id number]           Height:       60.0 in Accession #:    [id number]          Weight:       127.7 lb Date of Birth:  May 24, 1946           BSA:          1.543 m Patient Age:    74 years            BP:           125/45 mmHg Patient Gender: F                   HR:           84 bpm. Exam Location:  Inpatient Procedure: 2D Echo Indications:    NSTEMI  History:        Patient has prior history of  Echocardiogram examinations, most                 recent 01/09/2021. CAD, abdominal aortic aneurysm; Risk                 Factors:Dyslipidemia.  Sonographer:    Johny Chess Referring Phys: [id number] ROBIN [place]  1. Left ventricular ejection fraction, by estimation, is 60 to 65%. The left ventricle has normal function. The left ventricle has no regional wall motion abnormalities. Left ventricular diastolic parameters are consistent with Grade I diastolic dysfunction (impaired relaxation).  2. Right ventricular systolic function is normal. The right ventricular size is normal. There is normal pulmonary artery systolic pressure.  3. Left atrial size was severely dilated.  4. The mitral valve is grossly normal. Mild mitral valve regurgitation: Apperance of two jets; study may underestimated regurgitation severity.  5. The aortic valve is tricuspid. Aortic valve regurgitation is not visualized. No aortic stenosis is present.  6. The inferior vena cava is normal in size with greater than 50% respiratory variability, suggesting right atrial pressure of 3 mmHg. Comparison(s): A prior study was performed on 01/09/21. No significant change from prior study. Prior images reviewed side by side. Similar to prior. FINDINGS  Left Ventricle: Left ventricular ejection fraction, by estimation, is 60 to 65%. The left ventricle has normal function. The left ventricle has no regional wall motion abnormalities. The left ventricular internal cavity size was normal in size. There is  no left ventricular hypertrophy. Left ventricular diastolic parameters are consistent with Grade I diastolic dysfunction (impaired relaxation). Right Ventricle: The right ventricular size is normal. No increase in right ventricular wall thickness. Right ventricular systolic function is normal. There is normal pulmonary artery systolic pressure. The tricuspid regurgitant velocity is 2.74 m/s, and  with an assumed right atrial pressure of 3 mmHg,  the estimated right ventricular systolic pressure is 81.8 mmHg. Left Atrium: Left atrial  size was severely dilated. Right Atrium: Right atrial size was normal in size. Pericardium: There is no evidence of pericardial effusion. Mitral Valve: The mitral valve is grossly normal. There is mild thickening of the mitral valve leaflet(s). Mild to moderate mitral annular calcification. Mild mitral valve regurgitation. Tricuspid Valve: The tricuspid valve is normal in structure. Tricuspid valve regurgitation is mild. Aortic Valve: The aortic valve is tricuspid. There is mild to moderate aortic valve annular calcification. Aortic valve regurgitation is not visualized. No aortic stenosis is present. Pulmonic Valve: The pulmonic valve was grossly normal. Pulmonic valve regurgitation is not visualized. No evidence of pulmonic stenosis. Aorta: The aortic root and ascending aorta are structurally normal, with no evidence of dilitation. Venous: The inferior vena cava is normal in size with greater than 50% respiratory variability, suggesting right atrial pressure of 3 mmHg. IAS/Shunts: The atrial septum is grossly normal.  LEFT VENTRICLE PLAX 2D LVIDd:         4.40 cm     Diastology LVIDs:         3.20 cm     LV e' medial:    7.62 cm/s LV PW:         0.90 cm     LV E/e' medial:  10.6 LV IVS:        0.90 cm     LV e' lateral:   12.40 cm/s LVOT diam:     1.70 cm     LV E/e' lateral: 6.5 LV SV:         69 LV SV Index:   45 LVOT Area:     2.27 cm  LV Volumes (MOD) LV vol d, MOD A4C: 59.5 ml LV vol s, MOD A4C: 29.3 ml LV SV MOD A4C:     59.5 ml RIGHT VENTRICLE             IVC RV S prime:     12.90 cm/s  IVC diam: 1.50 cm TAPSE (M-mode): 1.7 cm LEFT ATRIUM             Index       RIGHT ATRIUM           Index LA diam:        3.70 cm 2.40 cm/m  RA Area:     11.30 cm LA Vol (A2C):   60.0 ml 38.90 ml/m RA Volume:   25.70 ml  16.66 ml/m LA Vol (A4C):   77.2 ml 50.05 ml/m LA Biplane Vol: 75.7 ml 49.07 ml/m  AORTIC VALVE LVOT Vmax:    143.00 cm/s LVOT Vmean:  101.000 cm/s LVOT VTI:    0.306 m  AORTA Ao Root diam: 2.90 cm Ao Asc diam:  2.80 cm MITRAL VALVE                TRICUSPID VALVE MV Area (PHT): 3.37 cm     TR Peak grad:   30.0 mmHg MV Decel Time: 225 msec     TR Vmax:        274.00 cm/s MV E velocity: 81.00 cm/s MV A velocity: 128.00 cm/s  SHUNTS MV E/A ratio:  0.63         Systemic VTI:  0.31 m                             Systemic Diam: 1.70 cm Rudean Haskell MD Electronically signed by Rudean Haskell MD Signature Date/Time: 02/01/2021/10:17:16 AM    Final  Cardiac Studies   The echocardiogram performed on this admission is unchanged from 1 done 3 weeks prior.  Normal LVEF.  No interval wall motion abnormality.  Patient Profile     75 y.o. female primary patient of Dr. [name], with prior coronary bypass grafting and documented LIMA to LAD, patent vein graft to PDA and PL, patent 50% LAD, normal circumflex, and 100% distal RCA.  Other medical problems include hyperlipidemia, primary hypertension, COPD, chronic diarrhea, and PAD.  Assessment & Plan    1. Non-ST elevation MI: Enzyme elevation and T wave changes on EKG suggests ischemic etiology.  Cannot totally exclude the possibility of Takotsubo.  Rule out progression of LAD and or circumflex native disease.  Rule out bypass grafts obstruction.  Coronary angiography is scheduled for today. 2. Coronary atherosclerosis with prior coronary bypass grafting: Known patent grafts in 2014.  Has sequential SVG to RCA and LIMA to LAD. 3. PAD, asymptomatic at this time. 4. Hyperlipidemia, target LDL less than 70: High intensity statin therapy  The patient was counseled to undergo left heart catheterization, coronary angiography, and possible percutaneous coronary intervention with stent implantation. The procedural risks and benefits were discussed in detail. The risks discussed included death, stroke, myocardial infarction, life-threatening bleeding, limb  ischemia, kidney injury, allergy, and possible emergency cardiac surgery. The risk of these significant complications were estimated to occur less than 1% of the time. After discussion, the patient has agreed to proceed.  For questions or updates, please contact [place] Please consult [email or web address] for contact info under        Signed, Sinclair Grooms, MD  02/02/2021, 11:03 AM

## 2021-02-02 NOTE — Interval H&P Note (Signed)
History and Physical Interval Note:  02/02/2021 1:08 PM  Megan [name]  has presented today for surgery, with the diagnosis of NSTEMI.  The various methods of treatment have been discussed with the patient and family. After consideration of risks, benefits and other options for treatment, the patient has consented to  Procedure(s): LEFT HEART CATH AND CORS/GRAFTS ANGIOGRAPHY (N/A) as a surgical intervention.  The patient's history has been reviewed, patient examined, no change in status, stable for surgery.  I have reviewed the patient's chart and labs.  Questions were answered to the patient's satisfaction.    Cath Lab Visit (complete for each Cath Lab visit)  Clinical Evaluation Leading to the Procedure:   ACS: Yes.    Non-ACS:  N/A  Christopher End

## 2021-02-02 NOTE — Progress Notes (Signed)
[place] for heparin Indication: chest pain/ACS  Allergies  Allergen Reactions  . Anticoagulant Compound Itching and Other (See Comments)    Hands and feet tingle and itch.   Darlin Coco [Valdecoxib] Other (See Comments)    Increased lft's   . Hydrocod Polst-Cpm Polst Er Rash  . Lipitor [Atorvastatin] Other (See Comments)    Leg weakness   . Mevacor [Lovastatin] Other (See Comments)    Muscle weakness   . Plavix [Clopidogrel Bisulfate] Itching and Other (See Comments)    Hands and feet tingle and itch  . Zocor [Simvastatin] Other (See Comments)    Bones hurt   . Doxycycline Diarrhea    N/v/d  . Metoprolol Other (See Comments)    Hair loss  . Morphine And Related Itching and Nausea Only  . Codeine Itching and Rash    Hyper-active  . Lisinopril Cough    Patient Measurements: Height: 5' (152.4 cm) Weight: 62.5 kg (137 lb 11.2 oz) IBW/kg (Calculated) : 45.5 Heparin Dosing Weight: 57.7 kg  Vital Signs: Temp: 97.6 F (36.4 C) (02/28 1218) Temp Source: Oral (02/28 1218) BP: 161/39 (02/28 1735) Pulse Rate: 82 (02/28 1735)  Labs: Recent Labs     0000 01/31/21 0445 01/31/21 0600 01/31/21 0714 01/31/21 0944 01/31/21 1309 02/01/21 0710 02/01/21 1630 02/02/21 0207 02/02/21 1213  HGB   < >  --  10.8*  --   --   --  11.0*  --  10.9*  --   HCT  --   --  31.5*  --   --   --  33.9*  --  32.3*  --   PLT  --   --  203  --   --   --  212  --  236  --   HEPARINUNFRC  --   --   --   --   --    < > 0.17* 0.24* 0.29* 0.48  CREATININE  --  1.82*  --   --   --   --  1.08*  --  0.80  --   TROPONINIHS  --  618*  --  881* 1,085*  --   --   --   --   --    < > = values in this interval not displayed.    Estimated Creatinine Clearance: 49.4 mL/min (by C-G formula based on SCr of 0.8 mg/dL).   Medical History: Past Medical History:  Diagnosis Date  . AAA (abdominal aortic aneurysm) ([place])    a. s/p stent grafting in 2009.  . Adenomatous  colon polyp 05/2001  . Adenomatous duodenal polyp   . Allergy   . Anginal pain ([place])   . COPD (chronic obstructive pulmonary disease) ([place])    pt denies COPD  . Coronary artery disease    a. s/p CABG in 1995;  b. 05/2013 Neg MV, EF 82%;  c. 06/2013 Cath: LM nl, LAD 40-50p, LCX nl, RCA 80p/156m, VG->RCA->PDA->PLSA min irregs, LIMA->LAD atretic, vigorous LV fxn.  . Diabetes mellitus without complication (HCC)    history of, resolved. 2017  . Diverticulosis   . Eczema, dyshidrotic   . Eczema, dyshidrotic 1986   Dr. Mathis Fare  . Emphysema of lung ([place])   . Fatty liver   . Fibromyalgia   . GERD (gastroesophageal reflux disease)   . Hyperlipidemia   . Hypertension   . Migraine   . Myocardial infarction ([place])   . Osteoarthritis   . Osteoporosis  unsure, having a bone scan soon  . Peripheral arterial disease (HCC)    left leg diagnosed by Dr Mare Ferrari  . Renal artery stenosis ([place])    a. 2008 s/p PTA    Medications:  See medication history  Assessment: 52 yoF with CP to start heparin. She has allergy to anticoagulant compound but not a serious reaction.  ~7hr f/u level undetectable. Per RN, line has occluded occasionally. Will give a repeat bolus and increase rate. No s/sx bleeding noted, Hgb ~11 and PLT ~200.    Heparin level this afternoon at goal level, then heparin turned off for cath.   Goal of Therapy:  Heparin level 0.3-0.7 units/ml Monitor platelets by anticoagulation protocol: Yes   Plan:  Heparin not resumed s/p cath Pharmacy will sign off.  [place] Crane, Roylene Reason, BCCP Clinical Pharmacist  02/02/2021 6:05 PM   [facility] pharmacy phone numbers are listed on [email or web address]

## 2021-02-02 NOTE — Progress Notes (Signed)
TR BAND REMOVAL  LOCATION:    Left  secondRadial band distal to the prox band   DEFLATED PER PROTOCOL:    Yes.    TIME BAND OFF / DRESSING APPLIED:    1510pm  No dressing needed  SITE UPON ARRIVAL:    Level 1- Bruise and tender to touch , but soft  Dr End to see patient  SITE AFTER BAND REMOVAL:    Level 1- buise  CIRCULATION SENSATION AND MOVEMENT:    Within Normal Limits   Yes.    COMMENTS:

## 2021-02-02 NOTE — Progress Notes (Signed)
[facility] Post-PCI Note:  Date: 02/02/21  Time: 5:01 PM  Patient examined in cath holding/recovery following PCI to SVG-rPDA-rPL complicated by left forearm hematoma following TR band removal.  Two TR bands remain in place.  There is mild swelling of the mid forearm proximal to the TR bands but no firm hematoma.  Left hand capillary refill is normal.  Patient's only complaint is of soreness in the left forearm at TR band sites.  Plan is to continue deflation of TR bands per protocol and to monitor left forearm overnight.  Patient advised to alert her nurse for any pain, swelling, bleeding, or numbness/weakness involving the left hand/forearm.  Nelva Bush, MD [facility]

## 2021-02-02 NOTE — Progress Notes (Signed)
[place] for heparin Indication: chest pain/ACS  Allergies  Allergen Reactions  . Anticoagulant Compound Itching and Other (See Comments)    Hands and feet tingle and itch.   Darlin Coco [Valdecoxib] Other (See Comments)    Increased lft's   . Hydrocod Polst-Cpm Polst Er Rash  . Lipitor [Atorvastatin] Other (See Comments)    Leg weakness   . Mevacor [Lovastatin] Other (See Comments)    Muscle weakness   . Plavix [Clopidogrel Bisulfate] Itching and Other (See Comments)    Hands and feet tingle and itch  . Zocor [Simvastatin] Other (See Comments)    Bones hurt   . Doxycycline Diarrhea    N/v/d  . Metoprolol Other (See Comments)    Hair loss  . Morphine And Related Itching and Nausea Only  . Codeine Itching and Rash    Hyper-active  . Lisinopril Cough    Patient Measurements: Height: 5' (152.4 cm) Weight: 62.5 kg (137 lb 11.2 oz) IBW/kg (Calculated) : 45.5 Heparin Dosing Weight: 57.7 kg  Vital Signs: Temp: 98.1 F (36.7 C) (02/28 0012) Temp Source: Oral (02/28 0012) BP: 136/72 (02/28 0012) Pulse Rate: 78 (02/28 0012)  Labs: Recent Labs     0000 01/31/21 0445 01/31/21 0600 01/31/21 0714 01/31/21 0944 01/31/21 1309 02/01/21 0710 02/01/21 1630 02/02/21 0207  HGB   < >  --  10.8*  --   --   --  11.0*  --  10.9*  HCT  --   --  31.5*  --   --   --  33.9*  --  32.3*  PLT  --   --  203  --   --   --  212  --  236  HEPARINUNFRC  --   --   --   --   --    < > 0.17* 0.24* 0.29*  CREATININE  --  1.82*  --   --   --   --  1.08*  --   --   TROPONINIHS  --  618*  --  881* 1,085*  --   --   --   --    < > = values in this interval not displayed.    Estimated Creatinine Clearance: 36.6 mL/min (A) (by C-G formula based on SCr of 1.08 mg/dL (H)).   Medical History: Past Medical History:  Diagnosis Date  . AAA (abdominal aortic aneurysm) ([place])    a. s/p stent grafting in 2009.  . Adenomatous colon polyp 05/2001  . Adenomatous  duodenal polyp   . Allergy   . Anginal pain ([place])   . COPD (chronic obstructive pulmonary disease) ([place])    pt denies COPD  . Coronary artery disease    a. s/p CABG in 1995;  b. 05/2013 Neg MV, EF 82%;  c. 06/2013 Cath: LM nl, LAD 40-50p, LCX nl, RCA 80p/131m, VG->RCA->PDA->PLSA min irregs, LIMA->LAD atretic, vigorous LV fxn.  . Diabetes mellitus without complication (HCC)    history of, resolved. 2017  . Diverticulosis   . Eczema, dyshidrotic   . Eczema, dyshidrotic 1986   Dr. Mathis Fare  . Emphysema of lung ([place])   . Fatty liver   . Fibromyalgia   . GERD (gastroesophageal reflux disease)   . Hyperlipidemia   . Hypertension   . Migraine   . Myocardial infarction ([place])   . Osteoarthritis   . Osteoporosis    unsure, having a bone scan soon  . Peripheral arterial disease (  [place])    left leg diagnosed by Dr Mare Ferrari  . Renal artery stenosis ([place])    a. 2008 s/p PTA    Medications:  See medication history  Assessment: 65 yoF with CP to start heparin. She has allergy to anticoagulant compound but not a serious reaction.  ~7hr f/u level undetectable. Per RN, line has occluded occasionally. Will give a repeat bolus and increase rate. No s/sx bleeding noted, Hgb ~11 and PLT ~200.    2/28 AM update:  Heparin level low but trending up CBC stable  Goal of Therapy:  Heparin level 0.3-0.7 units/ml Monitor platelets by anticoagulation protocol: Yes   Plan:  Inc heparin to 1250 units/hr 1200 heparin level  Narda Bonds, PharmD, BCPS Clinical Pharmacist Phone: [phone]

## 2021-02-02 NOTE — Brief Op Note (Signed)
BRIEF CARDIAC CATHETERIZATION NOTE  DATE: 02/02/2021  TIME: 3:10 PM  PATIENT:  Megan [name]  75 y.o. female  PRE-OPERATIVE DIAGNOSIS:  NSTEMI  POST-OPERATIVE DIAGNOSIS:  NSTEMI  PROCEDURE:  Procedure(s): LEFT HEART CATH AND CORS/GRAFTS ANGIOGRAPHY (N/A) CORONARY STENT INTERVENTION (N/A)  SURGEON:  Surgeon(s) and Role:    * End, Harrell Gave, MD - Primary  FINDINGS: 1. Severe single-vessel CAD with CTO of mid RCA. 2. Mild to moderate, diffuse LAD disease with competitive flow in the distal vessel. 3. Small but patent LIMA-LAD. 4. Patent SVG-rPDA-rPL with diffuse ostial/proximal disease of up to 60% and tandem hazy 95% and 90% mid graft lesions. 5. Successful PCI to ostial/proximal and mid SVG-rPDA-rPL using non-overlapping Resolute Onyx 3.0 x 26 mm and 3.5 x 34 mm drug-eluting stents with 0% residual stenosis and TIMI-3 flow.  RECOMMENDATIONS: 1. DAPT with aspirin and ticagrelor for at least 12 months. 2. Aggressive secondary prevention.  Nelva Bush, MD [facility]

## 2021-02-03 ENCOUNTER — Telehealth: Payer: Self-pay | Admitting: Cardiology

## 2021-02-03 ENCOUNTER — Encounter (HOSPITAL_COMMUNITY): Payer: Self-pay | Admitting: Internal Medicine

## 2021-02-03 ENCOUNTER — Other Ambulatory Visit: Payer: Self-pay | Admitting: Cardiology

## 2021-02-03 LAB — BASIC METABOLIC PANEL
Anion gap: 7 (ref 5–15)
BUN: 6 mg/dL — ABNORMAL LOW (ref 8–23)
CO2: 23 mmol/L (ref 22–32)
Calcium: 8.3 mg/dL — ABNORMAL LOW (ref 8.9–10.3)
Chloride: 108 mmol/L (ref 98–111)
Creatinine, Ser: 0.64 mg/dL (ref 0.44–1.00)
GFR, Estimated: >60 mL/min (ref >60)
Glucose, Bld: 95 mg/dL (ref 70–99)
Potassium: 3.7 mmol/L (ref 3.5–5.1)
Sodium: 138 mmol/L (ref 135–145)

## 2021-02-03 LAB — MAGNESIUM
Magnesium: 0.9 mg/dL — CL (ref 1.7–2.4)
Magnesium: 0.9 mg/dL — CL (ref 1.7–2.4)
Magnesium: 1.9 mg/dL (ref 1.7–2.4)

## 2021-02-03 LAB — GLUCOSE, CAPILLARY
Glucose-Capillary: 91 mg/dL (ref 70–99)
Glucose-Capillary: 113 mg/dL — ABNORMAL HIGH (ref 70–99)

## 2021-02-03 MED ORDER — METOPROLOL TARTRATE 25 MG PO TABS: 25.0000 mg | ORAL_TABLET | Freq: Two times a day (BID) | ORAL | 1 refills | Status: DC

## 2021-02-03 MED ORDER — POTASSIUM CHLORIDE CRYS ER 10 MEQ PO TBCR
20.0000 meq | EXTENDED_RELEASE_TABLET | Freq: Once | ORAL | Status: AC
  Administered 2021-02-03: 20 meq via ORAL
  Filled 2021-02-03: qty 2

## 2021-02-03 MED ORDER — POTASSIUM CHLORIDE CRYS ER 20 MEQ PO TBCR
20.0000 meq | EXTENDED_RELEASE_TABLET | Freq: Once | ORAL | Status: AC
  Administered 2021-02-03: 20 meq via ORAL
  Filled 2021-02-03: qty 1

## 2021-02-03 MED ORDER — MAGNESIUM SULFATE 2 GM/50ML IV SOLN: 2.0000 g | Freq: Once | INTRAVENOUS | Status: DC

## 2021-02-03 MED ORDER — ENOXAPARIN SODIUM 40 MG/0.4ML ~~LOC~~ SOLN: 40.0000 mg | Freq: Every day | SUBCUTANEOUS | Status: DC

## 2021-02-03 MED ORDER — TICAGRELOR 90 MG PO TABS: 90.0000 mg | ORAL_TABLET | Freq: Two times a day (BID) | ORAL | 2 refills | Status: DC

## 2021-02-03 MED ORDER — LOSARTAN POTASSIUM 25 MG PO TABS
25.0000 mg | ORAL_TABLET | Freq: Every day | ORAL | Status: DC
  Administered 2021-02-03: 25 mg via ORAL
  Filled 2021-02-03: qty 1

## 2021-02-03 MED ORDER — LOSARTAN POTASSIUM-HCTZ 100-25 MG PO TABS: 0.5000 | ORAL_TABLET | Freq: Every day | ORAL | 0 refills | Status: DC

## 2021-02-03 MED ORDER — MAGNESIUM SULFATE 4 GM/100ML IV SOLN
4.0000 g | Freq: Once | INTRAVENOUS | Status: AC
  Administered 2021-02-03: 4 g via INTRAVENOUS
  Filled 2021-02-03: qty 100

## 2021-02-03 MED ORDER — METOPROLOL TARTRATE 25 MG PO TABS
25.0000 mg | ORAL_TABLET | Freq: Two times a day (BID) | ORAL | Status: DC
  Administered 2021-02-03: 25 mg via ORAL
  Filled 2021-02-03: qty 1

## 2021-02-03 MED ORDER — MAGNESIUM SULFATE 2 GM/50ML IV SOLN
2.0000 g | Freq: Once | INTRAVENOUS | Status: AC
  Administered 2021-02-03: 2 g via INTRAVENOUS
  Filled 2021-02-03: qty 50

## 2021-02-03 MED FILL — BRILINTA 90 MG TABLET: 90 | 30 days supply | Qty: 60 | Fill #0

## 2021-02-03 MED FILL — METOPROLOL TARTRATE 25 MG T: 25 | 30 days supply | Qty: 60 | Fill #0

## 2021-02-03 NOTE — Telephone Encounter (Signed)
Patient is scheduled for a TOC Appt  02/12/21 with Kerin Ransom. Appt scheduled with Ria Comment NP

## 2021-02-03 NOTE — TOC Benefit Eligibility Note (Signed)
Transition of Care [facility]) Benefit Eligibility Note    Patient Details  Name: Megan [name] MRN: [id number] Date of Birth: December 04, 1946   Medication/Dose: BRILINTA  90 MG BID  Covered?: Yes  Tier: 3 Drug  Prescription Coverage Preferred Pharmacy: [facility]  and    [place] with Person/Company/Phone Number:: KAYE  @ [employer] # [phone]  Co-Pay: $ 47.00  Prior Approval: No  Deductible: Met  Additional Notes: TICAGRELOR : Crecencio Mc Phone Number: 02/03/2021, 10:23 AM

## 2021-02-03 NOTE — Discharge Summary (Addendum)
The patient has been seen in conjunction with Megan Paradise, NP. All aspects of care have been considered and discussed. The patient has been personally interviewed, examined, and all clinical data has been reviewed.   Please see the note from earlier in the day.  Magnesium will need to be followed up as an outpatient.  Right arm ecchymosis but no evidence of significant hematoma.  Pulses intact.  Plan discharge today, TOC follow-up in 1 week.  She will eventually be reunited with Dr. Swaziland.  Discharge Summary    Patient ID: Megan [name] MRN: [id number]; DOB: 14-Mar-1946  Admit date: 01/31/2021 Discharge date: 02/03/2021  PCP:  Megan Ora, MD   Megan [name]  Cardiologist:  Megan Swaz[place], MD  Advanced Practice Provider:  No care team member to display Electrophysiologist:  None    Discharge Diagnoses    Principal Problem:   NSTEMI (non-ST elevated myocardial infarction) Megan [name]) Active Problems:   Hyperlipidemia LDL goal <70   Hypokalemia   Primary hypertension   AKI (acute kidney injury) (HCC)   Hypomagnesemia    Diagnostic Studies/Procedures    Cath: 02/02/21  Conclusions: 1. Severe single-vessel coronary artery disease with chronic total occlusion of mid RCA. 2. Mild to moderate, diffuse LAD disease with competitive flow in the distal vessel from LIMA-LAD. 3. Small but patent LIMA-LAD. 4. Patent SVG-rPDA-rPL with diffuse ostial/proximal disease of up to 60% and tandem hazy 95% and 90% mid graft lesions. 5. Successful PCI to ostial/proximal and mid SVG-rPDA-rPL using non-overlapping Resolute Onyx 3.0 x 26 mm and 3.5 x 34 mm drug-eluting stents with 0% residual stenosis and TIMI-3 flow. 6. Left forearm hematoma following sheath removal ant TR band placement, controlled with manual compression and placement of second TR band proximal to the first band.  Recommendations: 1. Dual antiplatelet therapy with aspirin and ticagrelor for  at least 12 months. 2. Aggressive secondary prevention. 3. Close monitoring of left forearm hematoma.  Megan Kendall, MD [name]  Diagnostic Dominance: Right     History of Present Illness     Megan [name] is a 75 y.o. female with PMH CAD s/p CABG (1995)- last LHC 2014-patent grafts, PVD, RAS s/p stent 2008, AAA s/p repair/stent 2009, s/p iliac stent 2016, US Duplex 2019- AAA sac at 4cm, HTN, HLD presented to the ER with c/o chest pain.  Patient stated she woke up from sleep with chest pain, dull achy sensation, persisted all night and didn't go away.Took NTG with some relief and called EMS. EMS EKG did not show any ischemic changes but ER EKG shows TWI and rise in trop.  She had similar episode of chest tightness the day before which also lasted for a while and went away after NTG.  She was in the hospital recently 1/31 and discharged on 2/8- for AKI, diarrhea- diverticulitis (sigmoid colon), metabolic encephalopathy, UTI. She completed antibiotics but still has diarrhea- 4 times/day, loose watery and still has mild abdominal pain, no dysuria. She was taking imodium. She denied any fevers, chills, vomiting, does have mild dull ache in the lower abdomen. She was not taking torsemide.  Given elevated troponin she was admitted to cardiology for further management.   Hospital Course     1. NSTEMI with CAD s/p CABG hx: hsTn peaked at 1085. Underwent cardiac cath noted above with severe single vessel CAD with chronic occlusion of the mRCA, SVG-rPDA and rPL with diffuse ostial/proximal disease. Successful PCI to the o/p and mSVG to rPDA  and rPL with non-overlapping stents. Plan for DAPT with ASA/Brilinta for at least one year.  -- continue ASA, Brilinta, atorva 80mg  daily, metoprolol  2. HTN: She is on a number of medications prior to admission including Norvasc 5mg  daily, hydralazine 10mg  TID, Hyzaar 100-25mg  daily and started on metoprolol 12.5mg  BID this admission.   -- reported she had not been taking her hydralazine TID at home routinely. This was stopped at discharge -- metoprolol was increased to 25mg  BID -- given her blood pressures were lower at the time of discharge, will hold her amlodipine and have her cut her hyzaar by 1/2 tablet until seen in follow up  3. HLD: on atorvastatin 80mg  daily -- has had issues with myalgias in the past but stable on atorvastatin -- needs update lipid panel at follow up  4. Hypomag/hypokalemia: mag 0.9, K+ 3.7 -- given mag 2g IV and then additional 4g IV. Recheck at 1.9 -- does report she had several weeks of diarrhea with prior recent admission -- follow up with PCP for ongoing management  5. PAD: asymptomatic  6. Right radial hematoma: developed post cath yesterday and required compression/additional TR band. Does have bruising and tender to palpation. Site was soft -- instructed to use cool compress and elevation at discharge  7. Prolonged QTc: noted on post cath EKG -- Stopped Iodium at discharge    Did the patient have an acute coronary syndrome (MI, NSTEMI, STEMI, etc) this admission?:  Yes                               AHA/ACC Clinical Performance & Quality Measures: 4. Aspirin prescribed? - Yes 5. ADP Receptor Inhibitor (Plavix/Clopidogrel, Brilinta/Ticagrelor or Effient/Prasugrel) prescribed (includes medically managed patients)? - Yes 6. Beta Blocker prescribed? - Yes 7. High Intensity Statin (Lipitor 40-80mg  or Crestor 20-40mg ) prescribed? - Yes 8. EF assessed during THIS hospitalization? - Yes 9. For EF <40%, was ACEI/ARB prescribed? - Yes 10. For EF <40%, Aldosterone Antagonist (Spironolactone or Eplerenone) prescribed? - Not Applicable (EF >/= 40%) 11. Cardiac Rehab Phase II ordered (including medically managed patients)? - Yes       _____________  Discharge Vitals Blood pressure (!) 132/52, pulse 78, temperature 98.6 F (37 C), temperature source Oral, resp. rate 15, height 5'  (1.524 m), weight 62.5 kg, SpO2 93 %.  Filed Weights   01/31/21 0913 02/01/21 0013 02/02/21 0011  Weight: 57.9 kg 59.6 kg 62.5 kg    Labs & Radiologic Studies    CBC Recent Labs    02/01/21 0710 02/02/21 0207  WBC 6.4 6.9  HGB 11.0* 10.9*  HCT 33.9* 32.3*  MCV 87.8 87.5  PLT 212 236   Basic Metabolic Panel Recent Labs    09/81/19 0207 02/03/21 0252 02/03/21 1417  NA 142 138  --   K 4.4 3.7  --   CL 109 108  --   CO2 24 23  --   GLUCOSE 95 95  --   BUN 8 6*  --   CREATININE 0.80 0.64  --   CALCIUM 8.8* 8.3*  --   MG 1.1* 0.9*  0.9* 1.9   Liver Function Tests No results for input(s): AST, ALT, ALKPHOS, BILITOT, PROT, ALBUMIN in the last 72 hours. No results for input(s): LIPASE, AMYLASE in the last 72 hours. High Sensitivity Troponin:   Recent Labs  Lab 01/31/21 0445 01/31/21 0714 01/31/21 0944  TROPONINIHS 618* 881* 1,085*  BNP Invalid input(s): POCBNP D-Dimer No results for input(s): DDIMER in the last 72 hours. Hemoglobin A1C No results for input(s): HGBA1C in the last 72 hours. Fasting Lipid Panel No results for input(s): CHOL, HDL, LDLCALC, TRIG, CHOLHDL, LDLDIRECT in the last 72 hours. Thyroid Function Tests No results for input(s): TSH, T4TOTAL, T3FREE, THYROIDAB in the last 72 hours.  Invalid input(s): FREET3 _____________  CT ABDOMEN PELVIS WO CONTRAST  Result Date: 01/08/2021 CLINICAL DATA:  Abdominal pain and diarrhea EXAM: CT ABDOMEN AND PELVIS WITHOUT CONTRAST TECHNIQUE: Multidetector CT imaging of the abdomen and pelvis was performed following the standard protocol without IV contrast. COMPARISON:  Chest x-ray from earlier in the same day. FINDINGS: Lower chest: Mild emphysematous changes are noted. Mild bibasilar atelectatic changes are seen. Hepatobiliary: Gallbladder is well distended without evidence of cholelithiasis. No inflammatory changes are seen. The liver is unremarkable. Pancreas: Pancreas is atrophic but otherwise within normal  limits. Spleen: Scattered calcified granulomas are noted in the spleen. Adrenals/Urinary Tract: Adrenal glands are within normal limits. Nonobstructing left renal calculi are noted measuring 1-2 mm. No ureteral stones are seen. The bladder is partially distended. Stomach/Bowel: Diffuse diverticulosis of the sigmoid colon is noted. Wall thickening is noted consistent with smooth muscle hypertrophy. Minimal pericolonic inflammatory changes are seen without abscess or perforation. No obstructive changes are noted. The appendix is within normal limits. No inflammatory changes are seen. The small bowel and stomach are unremarkable. Vascular/Lymphatic: Atherosclerotic calcifications of the aorta are noted. Stent graft is noted in place. Aneurysm sac again measures 4 cm but stable. No significant lymphadenopathy is noted. Reproductive: Status post hysterectomy. No adnexal masses. Other: No abdominal wall hernia or abnormality. No abdominopelvic ascites. Musculoskeletal: Postoperative and degenerative changes of lumbar spine are noted. IMPRESSION: Mild bibasilar atelectatic changes. Changes of mild diverticulitis in the sigmoid colon. No abscess or perforation is noted. Of prior aortic stent graft therapy. 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 Electronically Signed   By: Alcide Clever M.D.   On: 01/08/2021 19:40   CT HEAD WO CONTRAST  Result Date: 01/06/2021 CLINICAL DATA:  Neuro deficit, acute, stroke suspected Mental status change, unknown cause Patient reports dizziness and speech issues for 4 days. EXAM: CT HEAD WITHOUT CONTRAST TECHNIQUE: Contiguous axial images were obtained from the base of the skull through the vertex without intravenous contrast. COMPARISON:  Brain MRI 09/30/2020 FINDINGS: Brain: Brain volume is normal for age. No intracranial hemorrhage, mass effect, or midline shift. No hydrocephalus. The basilar cisterns are patent. 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 No evidence of territorial infarct  or acute ischemia. No extra-axial or intracranial fluid collection. Vascular: Atherosclerosis of skullbase vasculature without hyperdense vessel or abnormal calcification. Skull: No fracture or focal lesion. Sinuses/Orbits: Chronic opacification of lower mastoid air cells. Paranasal sinuses are clear. Bilateral cataract resection. Other: None. IMPRESSION: No acute intracranial abnormality. Electronically Signed   By: Narda Rutherford M.D.   On: 01/06/2021 20:36   MR BRAIN WO CONTRAST  Result Date: 01/07/2021 CLINICAL DATA:  Encephalopathy EXAM: MRI HEAD WITHOUT CONTRAST TECHNIQUE: Multiplanar, multiecho pulse sequences of the brain and surrounding structures were obtained without intravenous contrast. COMPARISON:  None. FINDINGS: Brain: No acute infarct, mass effect or extra-axial collection. Focus of chronic microhemorrhage in the left frontal lobe. No acute hemorrhage. There is multifocal hyperintense T2-weighted signal within the white matter. Parenchymal volume and CSF spaces are normal. Incidentally noted pericallosal lipoma, unchanged. Vascular: Major flow voids are preserved. Skull and upper cervical spine: Normal calvarium  and skull base. Visualized upper cervical spine and soft tissues are normal. Sinuses/Orbits:No paranasal sinus fluid levels or advanced mucosal thickening. No mastoid or middle ear effusion. Normal orbits. IMPRESSION: 1. No acute intracranial abnormality. 2. 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 Electronically Signed   By: Deatra Robinson M.D.   On: 01/07/2021 22:22   CARDIAC CATHETERIZATION  Addendum Date: 02/02/2021   Conclusions: 1. Severe single-vessel coronary artery disease with chronic total occlusion of mid RCA. 2. Mild to moderate, diffuse LAD disease with competitive flow in the distal vessel from LIMA-LAD. 3. Small but patent LIMA-LAD. 4. Patent SVG-rPDA-rPL with diffuse ostial/proximal disease of up to 60% and tandem hazy  95% and 90% mid graft lesions. 5. Successful PCI to ostial/proximal and mid SVG-rPDA-rPL using non-overlapping Resolute Onyx 3.0 x 26 mm and 3.5 x 34 mm drug-eluting stents with 0% residual stenosis and TIMI-3 flow. 6. Left forearm hematoma following sheath removal ant TR band placement, controlled with manual compression and placement of second TR band proximal to the first band.  Recommendations: 1. Dual antiplatelet therapy with aspirin and ticagrelor for at least 12 months. 2. Aggressive secondary prevention. 3. Close monitoring of left forearm hematoma. Megan Kendall, MD [name]   Result Date: 02/02/2021 Conclusions: 1. Severe single-vessel coronary artery disease with chronic total occlusion of mid RCA. 2. Mild to moderate, diffuse LAD disease with competitive flow in the distal vessel from LIMA-LAD. 3. Small but patent LIMA-LAD. 4. Patent SVG-rPDA-rPL with diffuse ostial/proximal disease of up to 60% and tandem hazy 95% and 90% mid graft lesions. 5. Successful PCI to ostial/proximal and mid SVG-rPDA-rPL using non-overlapping Resolute Onyx 3.0 x 26 mm and 3.5 x 34 mm drug-eluting stents with 0% residual stenosis and TIMI-3 flow. 6. Left forearm hematoma following sheath removal ant TR band placement, controlled with manual compression and placement of second TR band proximal to the first band.  Recommendations: 1. Dual antiplatelet therapy with aspirin and ticagrelor for at least 12 months. 2. Aggressive secondary prevention. 3. Close monitoring of left forearm hematoma. Megan Kendall, MD [name]  DG Chest Portable 1 View  Result Date: 01/31/2021 CLINICAL DATA:  Chest pain EXAM: PORTABLE CHEST 1 VIEW COMPARISON:  01/10/2021 FINDINGS: Lungs are clear. No pneumothorax or pleural effusion. Coronary artery bypass grafting has been performed. Extensive calcification of the mitral valve annulus again noted. Cardiac size within normal limits. Pulmonary vascularity is normal. IMPRESSION: No  active disease. Electronically Signed   By: Helyn Numbers MD   On: 01/31/2021 05:36   DG CHEST PORT 1 VIEW  Result Date: 01/10/2021 CLINICAL DATA:  Shortness of breath EXAM: PORTABLE CHEST 1 VIEW COMPARISON:  January 08, 2021 FINDINGS: The cardiomediastinal silhouette is unchanged in contour.Atherosclerotic calcifications of the aorta. Status post median sternotomy. Small LEFT pleural effusion. No pneumothorax. Bibasilar linear opacities, likely atelectasis. Visualized abdomen is unremarkable. Multilevel degenerative changes of the thoracic spine. IMPRESSION: Small LEFT pleural effusion and bibasilar atelectasis. Electronically Signed   By: Meda Klinefelter MD   On: 01/10/2021 13:46   DG CHEST PORT 1 VIEW  Result Date: 01/08/2021 CLINICAL DATA:  Hypoxia. EXAM: PORTABLE CHEST 1 VIEW COMPARISON:  Chest x-ray 07/27/2021 FINDINGS: The cardiac silhouette, mediastinal and hilar contours are within normal limits and stable given the AP projection and portable technique. 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 Low lung volumes with vascular crowding and streaky atelectasis. No infiltrates or effusions. No pulmonary edema. Mild to moderate eventration of  the right hemidiaphragm. The bony thorax is intact. IMPRESSION: Low lung volumes with vascular crowding and streaky atelectasis. Electronically Signed   By: Rudie Meyer M.D.   On: 01/08/2021 15:47   DG Chest Port 1 View  Result Date: 01/07/2021 CLINICAL DATA:  Hiccups and hypoxia EXAM: PORTABLE CHEST 1 VIEW COMPARISON:  06/16/2020 FINDINGS: Patient rotated right. Prior median sternotomy. Midline trachea. Mild cardiomegaly. Mitral annular calcifications. Atherosclerosis in the transverse aorta. No pleural effusion or pneumothorax. Basilar predominant interstitial thickening is similar and may relate to chronic bronchitis or prior smoking. No lobar consolidation. IMPRESSION: No acute cardiopulmonary disease. Aortic Atherosclerosis  (ICD10-I70.0). Electronically Signed   By: Jeronimo Greaves M.D.   On: 01/07/2021 15:55   DG ABD ACUTE 2+V W 1V CHEST  Result Date: 01/06/2021 CLINICAL DATA:  Diarrhea. EXAM: DG ABDOMEN ACUTE WITH 1 VIEW CHEST COMPARISON:  Chest x-ray dated June 16, 2020. FINDINGS: There is no evidence of dilated bowel loops or free intraperitoneal air. No radiopaque calculi or other significant radiographic abnormality is seen. Prior EVAR and lumbar fusion. Heart size and mediastinal contours are within normal limits. Atherosclerotic calcification of the aortic arch. Prior CABG. Both lungs are clear. IMPRESSION: Negative abdominal radiographs.  No acute cardiopulmonary disease. Electronically Signed   By: Obie Dredge M.D.   On: 01/06/2021 12:19   ECHOCARDIOGRAM COMPLETE  Result Date: 02/01/2021    ECHOCARDIOGRAM REPORT   Patient Name:   CALLOWAY SABELLA [name] Date of Exam: 01/31/2021 Medical Rec #:  [id number]           Height:       60.0 in Accession #:    [id number]          Weight:       127.7 lb Date of Birth:  09/02/1946           BSA:          1.543 m Patient Age:    74 years            BP:           125/45 mmHg Patient Gender: F                   HR:           84 bpm. Exam Location:  Inpatient Procedure: 2D Echo Indications:    NSTEMI  History:        Patient has prior history of Echocardiogram examinations, most                 recent 01/09/2021. CAD, abdominal aortic aneurysm; Risk                 Factors:Dyslipidemia.  Sonographer:    Delcie Roch Referring Phys: [id number] ROBIN FERNANDES IMPRESSIONS  1. Left ventricular ejection fraction, by estimation, is 60 to 65%. The left ventricle has normal function. The left ventricle has no regional wall motion abnormalities. Left ventricular diastolic parameters are consistent with Grade I diastolic dysfunction (impaired relaxation).  2. Right ventricular systolic function is normal. The right ventricular size is normal. There is normal pulmonary artery systolic pressure.  3.  Left atrial size was severely dilated.  4. The mitral valve is grossly normal. Mild mitral valve regurgitation: Apperance of two jets; study may underestimated regurgitation severity.  5. The aortic valve is tricuspid. Aortic valve regurgitation is not visualized. No aortic stenosis is present.  6. The inferior vena cava is normal in size with greater than 50% respiratory variability,  suggesting right atrial pressure of 3 mmHg. Comparison(s): A prior study was performed on 01/09/21. No significant change from prior study. Prior images reviewed side by side. Similar to prior. FINDINGS  Left Ventricle: Left ventricular ejection fraction, by estimation, is 60 to 65%. The left ventricle has normal function. The left ventricle has no regional wall motion abnormalities. The left ventricular internal cavity size was normal in size. There is  no left ventricular hypertrophy. Left ventricular diastolic parameters are consistent with Grade I diastolic dysfunction (impaired relaxation). Right Ventricle: The right ventricular size is normal. No increase in right ventricular wall thickness. Right ventricular systolic function is normal. There is normal pulmonary artery systolic pressure. The tricuspid regurgitant velocity is 2.74 m/s, and  with an assumed right atrial pressure of 3 mmHg, the estimated right ventricular systolic pressure is 33.0 mmHg. Left Atrium: Left atrial size was severely dilated. Right Atrium: Right atrial size was normal in size. Pericardium: There is no evidence of pericardial effusion. Mitral Valve: The mitral valve is grossly normal. There is mild thickening of the mitral valve leaflet(s). Mild to moderate mitral annular calcification. Mild mitral valve regurgitation. Tricuspid Valve: The tricuspid valve is normal in structure. Tricuspid valve regurgitation is mild. Aortic Valve: The aortic valve is tricuspid. There is mild to moderate aortic valve annular calcification. Aortic valve regurgitation is not  visualized. No aortic stenosis is present. Pulmonic Valve: The pulmonic valve was grossly normal. Pulmonic valve regurgitation is not visualized. No evidence of pulmonic stenosis. Aorta: The aortic root and ascending aorta are structurally normal, with no evidence of dilitation. Venous: The inferior vena cava is normal in size with greater than 50% respiratory variability, suggesting right atrial pressure of 3 mmHg. IAS/Shunts: The atrial septum is grossly normal.  LEFT VENTRICLE PLAX 2D LVIDd:         4.40 cm     Diastology LVIDs:         3.20 cm     LV e' medial:    7.62 cm/s LV PW:         0.90 cm     LV E/e' medial:  10.6 LV IVS:        0.90 cm     LV e' lateral:   12.40 cm/s LVOT diam:     1.70 cm     LV E/e' lateral: 6.5 LV SV:         69 LV SV Index:   45 LVOT Area:     2.27 cm  LV Volumes (MOD) LV vol d, MOD A4C: 59.5 ml LV vol s, MOD A4C: 29.3 ml LV SV MOD A4C:     59.5 ml RIGHT VENTRICLE             IVC RV S prime:     12.90 cm/s  IVC diam: 1.50 cm TAPSE (M-mode): 1.7 cm LEFT ATRIUM             Index       RIGHT ATRIUM           Index LA diam:        3.70 cm 2.40 cm/m  RA Area:     11.30 cm LA Vol (A2C):   60.0 ml 38.90 ml/m RA Volume:   25.70 ml  16.66 ml/m LA Vol (A4C):   77.2 ml 50.05 ml/m LA Biplane Vol: 75.7 ml 49.07 ml/m  AORTIC VALVE LVOT Vmax:   143.00 cm/s LVOT Vmean:  101.000 cm/s LVOT VTI:    0.306  m  AORTA Ao Root diam: 2.90 cm Ao Asc diam:  2.80 cm MITRAL VALVE                TRICUSPID VALVE MV Area (PHT): 3.37 cm     TR Peak grad:   30.0 mmHg MV Decel Time: 225 msec     TR Vmax:        274.00 cm/s MV E velocity: 81.00 cm/s MV A velocity: 128.00 cm/s  SHUNTS MV E/A ratio:  0.63         Systemic VTI:  0.31 m                             Systemic Diam: 1.70 cm Riley Lam MD Electronically signed by Riley Lam MD Signature Date/Time: 02/01/2021/10:17:16 AM    Final    ECHOCARDIOGRAM COMPLETE  Result Date: 01/09/2021    ECHOCARDIOGRAM REPORT   Patient Name:   ZAMYRAH JASIN [name] Date of Exam: 01/09/2021 Medical Rec #:  [id number]           Height:       60.0 in Accession #:    [id number]          Weight:       135.0 lb Date of Birth:  Oct 29, 1946           BSA:          1.579 m Patient Age:    74 years            BP:           114/42 mmHg Patient Gender: F                   HR:           74 bpm. Exam Location:  Inpatient Procedure: 2D Echo, Color Doppler and Cardiac Doppler Indications:    R01.1 Murmur  History:        Patient has prior history of Echocardiogram examinations, most                 recent 11/16/2014. Prior CABG, COPD; Risk Factors:Hypertension,                 Diabetes and Dyslipidemia.  Sonographer:    Irving Burton Senior RDCS Referring Phys: [phone] JESSICA U VANN IMPRESSIONS  1. Left ventricular ejection fraction, by estimation, is 60 to 65%. The left ventricle has normal function. The left ventricle has no regional wall motion abnormalities. There is mild asymmetric left ventricular hypertrophy of the basal-septal segment. Left ventricular diastolic parameters are consistent with Grade I diastolic dysfunction (impaired relaxation).  2. Right ventricular systolic function is normal. The right ventricular size is normal. There is normal pulmonary artery systolic pressure.  3. Left atrial size was severely dilated.  4. The mitral valve is normal in structure. Mild mitral valve regurgitation. No evidence of mitral stenosis. The mean mitral valve gradient is 4.0 mmHg. Moderate mitral annular calcification.  5. Tricuspid valve regurgitation is moderate.  6. The aortic valve is normal in structure. Aortic valve regurgitation is not visualized. No aortic stenosis is present.  7. The inferior vena cava is normal in size with greater than 50% respiratory variability, suggesting right atrial pressure of 3 mmHg. FINDINGS  Left Ventricle: Left ventricular ejection fraction, by estimation, is 60 to 65%. The left ventricle has normal function. The left ventricle has no regional wall  motion abnormalities. The left  ventricular internal cavity size was normal in size. There is  mild asymmetric left ventricular hypertrophy of the basal-septal segment. Left ventricular diastolic parameters are consistent with Grade I diastolic dysfunction (impaired relaxation). Right Ventricle: The right ventricular size is normal. No increase in right ventricular wall thickness. Right ventricular systolic function is normal. There is normal pulmonary artery systolic pressure. The tricuspid regurgitant velocity is 2.84 m/s, and  with an assumed right atrial pressure of 3 mmHg, the estimated right ventricular systolic pressure is 35.3 mmHg. Left Atrium: Left atrial size was severely dilated. Right Atrium: Right atrial size was normal in size. Pericardium: There is no evidence of pericardial effusion. Mitral Valve: The mitral valve is normal in structure. There is mild thickening of the mitral valve leaflet(s). Moderate mitral annular calcification. Mild mitral valve regurgitation. No evidence of mitral valve stenosis. MV peak gradient, 7.8 mmHg. The mean mitral valve gradient is 4.0 mmHg. Tricuspid Valve: The tricuspid valve is normal in structure. Tricuspid valve regurgitation is moderate . No evidence of tricuspid stenosis. Aortic Valve: The aortic valve is normal in structure. Aortic valve regurgitation is not visualized. No aortic stenosis is present. Pulmonic Valve: The pulmonic valve was normal in structure. Pulmonic valve regurgitation is mild. No evidence of pulmonic stenosis. Aorta: The aortic root is normal in size and structure. Venous: The inferior vena cava is normal in size with greater than 50% respiratory variability, suggesting right atrial pressure of 3 mmHg. IAS/Shunts: No atrial level shunt detected by color flow Doppler.  LEFT VENTRICLE PLAX 2D LVIDd:         4.00 cm  Diastology LVIDs:         2.50 cm  LV e' medial:    6.74 cm/s LV PW:         1.10 cm  LV E/e' medial:  13.9 LV IVS:        1.30 cm   LV e' lateral:   9.90 cm/s LVOT diam:     1.90 cm  LV E/e' lateral: 9.5 LV SV:         90 LV SV Index:   57 LVOT Area:     2.84 cm  RIGHT VENTRICLE RV S prime:     8.70 cm/s TAPSE (M-mode): 1.9 cm LEFT ATRIUM             Index       RIGHT ATRIUM           Index LA diam:        4.60 cm 2.91 cm/m  RA Area:     15.10 cm LA Vol (A2C):   89.4 ml 56.60 ml/m RA Volume:   37.80 ml  23.93 ml/m LA Vol (A4C):   75.2 ml 47.61 ml/m LA Biplane Vol: 86.0 ml 54.45 ml/m  AORTIC VALVE LVOT Vmax:   142.00 cm/s LVOT Vmean:  93.100 cm/s LVOT VTI:    0.317 m  AORTA Ao Root diam: 2.70 cm Ao Asc diam:  3.10 cm MITRAL VALVE                TRICUSPID VALVE MV Area (PHT): 2.66 cm     TR Peak grad:   32.3 mmHg MV Area VTI:   2.25 cm     TR Vmax:        284.00 cm/s MV Peak grad:  7.8 mmHg MV Mean grad:  4.0 mmHg     SHUNTS MV Vmax:       1.40 m/s  Systemic VTI:  0.32 m MV Vmean:      98.3 cm/s    Systemic Diam: 1.90 cm MV Decel Time: 285 msec MV E velocity: 93.80 cm/s MV A velocity: 123.00 cm/s MV E/A ratio:  0.76 Donato Schultz MD Electronically signed by Donato Schultz MD Signature Date/Time: 01/09/2021/12:31:31 PM    Final    Disposition   Pt is being discharged home today in good condition.  Follow-up Plans & Appointments     Follow-up Information    Abelino Derrick, PA-C Follow up on 02/12/2021.   Specialties: Cardiology, Radiology Why: at 11:15am for your follow up appt Contact information: [address] [place] [address] [phone]        Megan Ora, MD Follow up.   Specialty: Family Medicine Why: For further management of your low potassium and magnesium levels Contact information: [address] [place] [address] [phone]              Discharge Instructions    Amb Referral to Cardiac Rehabilitation   Complete by: As directed    Diagnosis:  NSTEMI Coronary Stents     After initial evaluation and assessments completed: Virtual Based Care may be  provided alone or in conjunction with Phase 2 Cardiac Rehab based on patient barriers.: Yes   Diet - low sodium heart healthy   Complete by: As directed    Discharge instructions   Complete by: As directed    Radial Site Care Refer to this sheet in the next few weeks. These instructions provide you with information on caring for yourself after your procedure. Your caregiver may also give you more specific instructions. Your treatment has been planned according to current medical practices, but problems sometimes occur. Call your caregiver if you have any problems or questions after your procedure. HOME CARE INSTRUCTIONS You may shower the day after the procedure.Remove the bandage (dressing) and gently wash the site with plain soap and water.Gently pat the site dry.  Do not apply powder or lotion to the site.  Do not submerge the affected site in water for 3 to 5 days.  Inspect the site at least twice daily.  Do not flex or bend the affected arm for 24 hours.  No lifting over 5 pounds (2.3 kg) for 5 days after your procedure.  Do not drive home if you are discharged the same day of the procedure. Have someone else drive you.  You may drive 24 hours after the procedure unless otherwise instructed by your caregiver.  What to expect: Any bruising will usually fade within 1 to 2 weeks.  Blood that collects in the tissue (hematoma) may be painful to the touch. It should usually decrease in size and tenderness within 1 to 2 weeks.  SEEK IMMEDIATE MEDICAL CARE IF: You have unusual pain at the radial site.  You have redness, warmth, swelling, or pain at the radial site.  You have drainage (other than a small amount of blood on the dressing).  You have chills.  You have a fever or persistent symptoms for more than 72 hours.  You have a fever and your symptoms suddenly get worse.  Your arm becomes pale, cool, tingly, or numb.  You have heavy bleeding from the site. Hold pressure on the site.    PLEASE DO NOT MISS ANY DOSES OF YOUR BRILINTA!!!!! Also keep a log of you blood pressures and bring back to your follow up appt. Please call the  office with any questions.   Patients taking blood thinners should generally stay away from medicines like ibuprofen, Advil, Motrin, naproxen, and Aleve due to risk of stomach bleeding. You may take Tylenol as directed or talk to your primary doctor about alternatives.   PLEASE ENSURE THAT YOU DO NOT RUN OUT OF YOUR BRILINTA. This medication is very important to remain on for at least one year. IF you have issues obtaining this medication due to cost please CALL the office 3-5 business days prior to running out in order to prevent missing doses of this medication.   Increase activity slowly   Complete by: As directed       Discharge Medications   Allergies as of 02/03/2021      Reactions   Anticoagulant Compound Itching, Other (See Comments)   Hands and feet tingle and itch.   Bextra [valdecoxib] Other (See Comments)   Increased lft's   Hydrocod Polst-cpm Polst Er Rash   Lipitor [atorvastatin] Other (See Comments)   Leg weakness   Mevacor [lovastatin] Other (See Comments)   Muscle weakness   Plavix [clopidogrel Bisulfate] Itching, Other (See Comments)   Hands and feet tingle and itch   Zocor [simvastatin] Other (See Comments)   Bones hurt   Doxycycline Diarrhea   N/v/d   Metoprolol Other (See Comments)   Hair loss   Morphine And Related Itching, Nausea Only   Codeine Itching, Rash   Hyper-active   Lisinopril Cough      Medication List    STOP taking these medications   amLODipine 5 MG tablet Commonly known as: NORVASC   hydrALAZINE 10 MG tablet Commonly known as: APRESOLINE   loperamide 2 MG capsule Commonly known as: IMODIUM   meloxicam 15 MG tablet Commonly known as: MOBIC     TAKE these medications   Accu-Chek FastClix Lancets Misc Check blood sugar once daily PRN   Accu-Chek SmartView test strip Generic drug:  glucose blood CHECK BLOOD SUGAR ONCE DAILY AS NEEDED   albuterol 108 (90 Base) MCG/ACT inhaler Commonly known as: VENTOLIN HFA Inhale 2 puffs into the lungs every 4 (four) hours as needed for wheezing or shortness of breath.   ALPRAZolam 0.25 MG tablet Commonly known as: XANAX Take 1 tablet (0.25 mg total) by mouth at bedtime as needed for sleep.   aspirin EC 81 MG tablet Take 1 tablet (81 mg total) by mouth daily.   atorvastatin 80 MG tablet Commonly known as: LIPITOR TAKE 1 TABLET BY MOUTH EVERY DAY   augmented betamethasone dipropionate 0.05 % cream Commonly known as: DIPROLENE-AF Apply 1 application topically daily as needed (rash). For hands   B-100 COMPLEX PO Take 1 tablet by mouth at bedtime.   Breztri Aerosphere 160-9-4.8 MCG/ACT Aero Generic drug: Budeson-Glycopyrrol-Formoterol Inhale 2 puffs into the lungs in the morning and at bedtime. What changed:   how much to take  when to take this  reasons to take this   ciclopirox 0.77 % cream Commonly known as: LOPROX Apply 1 application topically daily as needed (hands fungus).   Cordran 4 MCG/SQCM Tape Generic drug: Flurandrenolide Apply 1 each topically daily as needed (Rash).   cycloSPORINE 0.05 % ophthalmic emulsion Commonly known as: RESTASIS Place 1 drop into both eyes 2 (two) times daily.   gabapentin 300 MG capsule Commonly known as: NEURONTIN Take 1-2 capsules (300-600 mg total) by mouth 3 (three) times daily. What changed:   how much to take  when to take this   losartan-hydrochlorothiazide 100-25  MG tablet Commonly known as: HYZAAR Take 0.5 tablets by mouth daily. What changed: how much to take   metoprolol tartrate 25 MG tablet Commonly known as: LOPRESSOR Take 1 tablet (25 mg total) by mouth 2 (two) times daily.   multivitamin with minerals tablet Take 1 tablet by mouth at bedtime.   nitroGLYCERIN 0.4 MG SL tablet Commonly known as: NITROSTAT Place 1 tablet (0.4 mg total) under  the tongue every 5 (five) minutes as needed for chest pain. What changed: reasons to take this   nystatin 100000 UNIT/ML suspension Commonly known as: MYCOSTATIN Take 5 mLs (500,000 Units total) by mouth 4 (four) times daily.   omeprazole 40 MG capsule Commonly known as: PRILOSEC TAKE 1 CAPSULE BY MOUTH TWICE A DAY--NEED APPT FOR FURTHER REFILLS What changed:   how much to take  how to take this  when to take this  additional instructions   ondansetron 8 MG tablet Commonly known as: ZOFRAN Take by mouth every 8 (eight) hours as needed for nausea or vomiting.   oxyCODONE-acetaminophen 10-325 MG tablet Commonly known as: Percocet Take 1 tablet by mouth every 6 (six) hours as needed for pain. What changed: when to take this   potassium chloride 10 MEQ tablet Commonly known as: KLOR-CON Take 1 tablet (10 mEq total) by mouth 2 (two) times daily.   saccharomyces boulardii 250 MG capsule Commonly known as: FLORASTOR Take 1 capsule (250 mg total) by mouth 2 (two) times daily. Any probiotic available is okay   simethicone 80 MG chewable tablet Commonly known as: MYLICON Chew 1 tablet (80 mg total) by mouth 4 (four) times daily as needed for flatulence (bloating). also available OTC   ticagrelor 90 MG Tabs tablet Commonly known as: BRILINTA Take 1 tablet (90 mg total) by mouth 2 (two) times daily.   torsemide 20 MG tablet Commonly known as: DEMADEX Take 0.5 tablets (10 mg total) by mouth daily as needed (fluid).   tretinoin 0.1 % cream Commonly known as: RETIN-A Apply 1 application topically every other day. In the evening   VITAMIN D (ERGOCALCIFEROL) PO Take 1,000 Units by mouth every evening.          Outstanding Labs/Studies   FLP/BMET at follow up  Duration of Discharge Encounter   Greater than 30 minutes including physician time.  Signed, Laverda Page, NP 02/03/2021, 3:57 PM

## 2021-02-03 NOTE — Telephone Encounter (Signed)
Patient is currently admitted

## 2021-02-03 NOTE — Progress Notes (Signed)
CARDIAC REHAB PHASE I   PRE:  Rate/Rhythm: 21 SR  BP:  Supine:   Sitting: 140/60  Standing:    SaO2: 98 RA  MODE:  Ambulation: 470 ft ft   POST:  Rate/Rhythm: 84 SR  BP:  Supine:   Sitting: 119/98  Standing:    SaO2: 97 RA  Pt tolerated exercise well and amb 470 ft with standby assist and no assistive device. Pt had slight SOB, resolved with pursed lip breathing and a short break, SaO2 was 97% on RA. Pt c/o back pain that is chronic and constant. No CP on walk. Education on heart healthy diet, MI booklet, NTG, Aspirin, Brilinta, home exercise guidelines, and to watch radial site. Pt has a care giver full time at her house to help. Will refer to [facility] cardiac rehab. Pt verbalized understanding and her questions were answered. Pt was left in recliner with call bell in reach   [phone] Kirby Funk ACSM-EP 02/03/2021 9:50 AM

## 2021-02-03 NOTE — Progress Notes (Addendum)
The patient has been seen in conjunction with Harlan Stains, NP. All aspects of care have been considered and discussed. The patient has been personally interviewed, examined, and all clinical data has been reviewed.   Successful multisite stenting of RCA graft.  Excellent result.  No recurrent angina.  Radial cath site does reveal ecchymosis and tenderness.  Some fullness above the entry site but is soft.  Able to move hand without difficulty.  This will worsen in appearance over the next several days to a week.  Suggest cold compresses over the next 48 hours intermittently.  Magnesium level less than 1.0.  Being given supplemental magnesium intravenously.  Will also give a dose of K. Dur since potassium is 3.7 and has been as low as 3.3 during this admission.  This is presumed related to diarrhea and diuretic therapy.  Will need follow-up as OP.  Once intravenous magnesium infusion is complete, she is eligible for discharge.  Needs follow-up basic metabolic panel in 1 week.  Discussed dual antiplatelet therapy duration of at least 6 months, and preferably 12.   Progress Note  Patient Name: Megan [name] Date of Encounter: 02/03/2021  [facility] Cardiologist: [name], MD  Subjective   Doing well this morning. No chest pain overnight.   Right radial cath site with bruising, tender but soft  Inpatient Medications    Scheduled Meds: . aspirin EC  81 mg Oral Daily  . atorvastatin  80 mg Oral Daily  . enoxaparin (LOVENOX) injection  40 mg Subcutaneous Daily  . gabapentin  300 mg Oral BID  . hydrALAZINE  10 mg Oral TID  . insulin aspart  0-9 Units Subcutaneous TID WC  . metoprolol tartrate  12.5 mg Oral BID  . sodium chloride flush  3 mL Intravenous Q12H  . ticagrelor  90 mg Oral BID   Continuous Infusions: . sodium chloride    . magnesium sulfate bolus IVPB     PRN Meds: sodium chloride, acetaminophen, albuterol, ALPRAZolam, loperamide, magic mouthwash,  nitroGLYCERIN, ondansetron (ZOFRAN) IV, oxyCODONE-acetaminophen, simethicone, sodium chloride flush   Vital Signs    Vitals:   02/02/21 2119 02/02/21 2358 02/03/21 0500 02/03/21 0812  BP: 136/60 (!) 131/53 113/90 134/60  Pulse:  71  82  Resp:    15  Temp:  98.2 F (36.8 C) 98.1 F (36.7 C) 98.6 F (37 C)  TempSrc:  Oral Oral Oral  SpO2:  100%  96%  Weight:      Height:        Intake/Output Summary (Last 24 hours) at 02/03/2021 0814 Last data filed at 02/03/2021 0520 Gross per 24 hour  Intake 210 ml  Output 1150 ml  Net -940 ml   Last 3 Weights 02/02/2021 02/01/2021 01/31/2021  Weight (lbs) 137 lb 11.2 oz 131 lb 6.4 oz 127 lb 11.2 oz  Weight (kg) 62.46 kg 59.603 kg 57.924 kg      Telemetry    SR 70-80s - Personally Reviewed  ECG    SR with inferolateral TWI, prolonged QT interval - Personally Reviewed  Physical Exam   GEN: No acute distress.   Neck: No JVD Cardiac: RRR, no murmurs, rubs, or gallops.  Respiratory: Clear to auscultation bilaterally. GI: Soft, nontender, non-distended  MS: No edema; No deformity.  Right radial site with bruising but soft.  Neuro:  Nonfocal  Psych: Normal affect   Labs    High Sensitivity Troponin:   Recent Labs  Lab 01/31/21 0445 01/31/21 0714 01/31/21  [place]  Lab 01/31/21 0445 02/01/21 0710 02/02/21 0207 02/03/21 0252  NA 136 139 142 138  K 3.5 3.3* 4.4 3.7  CL 102 104 109 108  CO2 23 23 24 23   GLUCOSE 111* 143* 95 95  BUN 16 9 8  6*  CREATININE 1.82* 1.08* 0.80 0.64  CALCIUM 8.9 8.7* 8.8* 8.3*  PROT 4.9*  --   --   --   ALBUMIN 2.4*  --   --   --   AST 26  --   --   --   ALT 15  --   --   --   ALKPHOS 47  --   --   --   BILITOT 0.7  --   --   --   GFRNONAA 28* 53* >60 >60  ANIONGAP 11 12 9 7      Hematology Recent Labs  Lab 01/31/21 0600 02/01/21 0710 02/02/21 0207  WBC 7.6 6.4 6.9  RBC 3.60* 3.86* 3.69*  HGB 10.8* 11.0* 10.9*  HCT 31.5* 33.9*  32.3*  MCV 87.5 87.8 87.5  MCH 30.0 28.5 29.5  MCHC 34.3 32.4 33.7  RDW 13.5 13.5 13.6  PLT 203 212 236    BNPNo results for input(s): BNP, PROBNP in the last 168 hours.   DDimer No results for input(s): DDIMER in the last 168 hours.   Radiology    CARDIAC CATHETERIZATION  Addendum Date: 02/02/2021   Conclusions: 1. Severe single-vessel coronary artery disease with chronic total occlusion of mid RCA. 2. Mild to moderate, diffuse LAD disease with competitive flow in the distal vessel from LIMA-LAD. 3. Small but patent LIMA-LAD. 4. Patent SVG-rPDA-rPL with diffuse ostial/proximal disease of up to 60% and tandem hazy 95% and 90% mid graft lesions. 5. Successful PCI to ostial/proximal and mid SVG-rPDA-rPL using non-overlapping Resolute Onyx 3.0 x 26 mm and 3.5 x 34 mm drug-eluting stents with 0% residual stenosis and TIMI-3 flow. 6. Left forearm hematoma following sheath removal ant TR band placement, controlled with manual compression and placement of second TR band proximal to the first band.  Recommendations: 1. Dual antiplatelet therapy with aspirin and ticagrelor for at least 12 months. 2. Aggressive secondary prevention. 3. Close monitoring of left forearm hematoma. Nelva Bush, MD [facility]   Result Date: 02/02/2021 Conclusions: 1. Severe single-vessel coronary artery disease with chronic total occlusion of mid RCA. 2. Mild to moderate, diffuse LAD disease with competitive flow in the distal vessel from LIMA-LAD. 3. Small but patent LIMA-LAD. 4. Patent SVG-rPDA-rPL with diffuse ostial/proximal disease of up to 60% and tandem hazy 95% and 90% mid graft lesions. 5. Successful PCI to ostial/proximal and mid SVG-rPDA-rPL using non-overlapping Resolute Onyx 3.0 x 26 mm and 3.5 x 34 mm drug-eluting stents with 0% residual stenosis and TIMI-3 flow. 6. Left forearm hematoma following sheath removal ant TR band placement, controlled with manual compression and placement of second TR band  proximal to the first band.  Recommendations: 1. Dual antiplatelet therapy with aspirin and ticagrelor for at least 12 months. 2. Aggressive secondary prevention. 3. Close monitoring of left forearm hematoma. Nelva Bush, MD  [facility]   Cardiac Studies   Cath: 02/02/21  Conclusions: 1. Severe single-vessel coronary artery disease with chronic total occlusion of mid RCA. 2. Mild to moderate, diffuse LAD disease with competitive flow in the distal vessel from LIMA-LAD. 3. Small but patent LIMA-LAD. 4. Patent SVG-rPDA-rPL with diffuse ostial/proximal disease of  up to 60% and tandem hazy 95% and 90% mid graft lesions. 5. Successful PCI to ostial/proximal and mid SVG-rPDA-rPL using non-overlapping Resolute Onyx 3.0 x 26 mm and 3.5 x 34 mm drug-eluting stents with 0% residual stenosis and TIMI-3 flow. 6. Left forearm hematoma following sheath removal ant TR band placement, controlled with manual compression and placement of second TR band proximal to the first band.  Recommendations: 1. Dual antiplatelet therapy with aspirin and ticagrelor for at least 12 months. 2. Aggressive secondary prevention. 3. Close monitoring of left forearm hematoma.  Nelva Bush, MD [facility]  Diagnostic Dominance: Right    Intervention      Patient Profile     75 y.o. female with PMH of CAD s/p CABG, HTN, HLD, PAD who presented with chest pain and found to have a NSTEMI.  Assessment & Plan    1. NSTEMI with CAD s/p CABG hx: hsTn peaked at 1085. Underwent cardiac cath noted above with severe single vessel CAD with chronic occlusion of the mRCA, SVG-rPDA and rPL with diffuse ostial/proximal disease. Successful PCI to the o/p and mSVG to rPDA and rPL with non-overlapping stents. Plan for DAPT with ASA/Brilinta for at least one year.  -- continue ASA, Brilinta, atorva 80mg  daily, metoprolol  2. HTN: blood pressures have been elevated. She is on a number of medications prior to  admission including Norvasc 5mg  daily, hydralazine 10mg  TID, Hyzaar 100-25mg  daily and started on metoprolol 12.5mg  BID this admission.  -- reports she has not been taking her hydralazine TID at home routinely, favor stopping this at discharge -- will increase metoprolol to 25mg  BID and start losartan 25mg  daily  3. HLD: on atorvastatin 80mg  daily -- check lipids in am  4. Hypomag/hypokalemia: mag 0.9 this morning, K+ 3.7 -- given mag 2g IV this morning, will give an additional 4g IV x1 now and recheck levels this afternoon -- does report she had several weeks of diarrhea prior to admission  5. PAD: asymptomatic  6. Right radial hematoma: developed post cath yesterday and required compression/additional TR band. Does have bruising this morning and tender to palpation. Site is soft  7. Prolonged QTc: EKG this morning with QTc 492. Will stop Imodium and PRN zofran for now  For questions or updates, please contact [place] Please consult [email or web address] for contact info under        Signed, Reino Bellis, NP  02/03/2021, 8:14 AM

## 2021-02-03 NOTE — Progress Notes (Signed)
Critical lab value Mg 0.9. Last Mg was 1.1 on 2/28. Paged Dr. Paticia Stack. Dr. Paticia Stack returned paged with new orders: 2 gram IV magnesium and recheck Mg level 3 hours after infusion complete. See new orders.

## 2021-02-03 NOTE — Care Management (Signed)
02-03-21 Benefits check submitted for Brilinta. Case Manager will follow for cost. Graves-Bigelow, Ocie Cornfield, RN,BSN Case Manager

## 2021-02-03 NOTE — Consult Note (Signed)
   [facility] CM Inpatient Consult   02/03/2021  Tamsin Nader 02/11/1946 [id number]   [facility] [place] [facility] [ACO] Patient: [place] Medicare  PCP: Billey Chang, MD [place] practice   Patient screened for hospitalizations with noted for unplanned readmission less than 30 days to assess for potential [place] Management service needs for post hospital transition.  Review of patient's medical record reveals patient has a primary care provider in the [facility] practice at [facility] where she can be followed for Chronic Care Management.  Currently, patient is being recommended for Cardiac Rehab for transition of care needs.  Patient will also have a TOC follow up with her PCP office. According to review patient has a caregiver at home as well.  Plan:  Continue to follow progress and disposition to assess for post hospital care management needs. Will refer to the [facility] team if appropriate for transition.   For questions contact:   Natividad Brood, RN BSN [facility] Liaison  [phone] business mobile phone Toll free office [phone]  Fax number: [phone] [email or web address]

## 2021-02-04 ENCOUNTER — Other Ambulatory Visit: Payer: Medicare Other

## 2021-02-05 ENCOUNTER — Telehealth: Payer: Self-pay

## 2021-02-05 NOTE — Telephone Encounter (Signed)
Transition Care Management Follow-up Telephone Call  Date of discharge and from where: [place] [facility] 02/03/21  How have you been since you were released from the hospital? Feeling ok  Any questions or concerns? No  Items Reviewed:  Did the pt receive and understand the discharge instructions provided? Yes   Medications obtained and verified? Yes   Other? No   Any new allergies since your discharge? No   Dietary orders reviewed? Yes  Do you have support at home? Yes   Home Care and Equipment/Supplies: Were home health services ordered? not applicable If so, what is the name of the agency?   Has the agency set up a time to come to the patient's home? not applicable Were any new equipment or medical supplies ordered?  No What is the name of the medical supply agency?  Were you able to get the supplies/equipment? not applicable Do you have any questions related to the use of the equipment or supplies? No  Functional Questionnaire: (I = Independent and D = Dependent) ADLs: I  Bathing/Dressing- I  Meal Prep- I  Eating- I  Maintaining continence- I  Transferring/Ambulation- I  Managing Meds- I  Follow up appointments reviewed:   PCP Hospital f/u appt confirmed? Yes  Scheduled to see Dr Billey Chang on 02/13/21 @ 9:30 a.m  Specialist Hospital f/u appt confirmed? Yes  Scheduled to see Dr Martinique on 02/18/21 @ 3:20p.m.  Are transportation arrangements needed? No   If their condition worsens, is the pt aware to call PCP or go to the Emergency Dept.? Yes  Was the patient provided with contact information for the PCP's office or ED? Yes  Was to pt encouraged to call back with questions or concerns? Yes

## 2021-02-05 NOTE — Telephone Encounter (Signed)
Transition Care Management Unsuccessful Follow-up Telephone Call  Date of discharge and from where:  02/03/21 [name] /Heart care  Attempts:  1st Attempt  Reason for unsuccessful TCM follow-up call:  Left voice message

## 2021-02-05 NOTE — Telephone Encounter (Signed)
Patient contacted regarding discharge from [facility] on 02-03-21.  Patient understands to follow up with provider [place] on 02-18-21 at 3:20 pm at [facility]. Patient understands discharge instructions? yes Patient understands medications and regiment? yes Patient understands to bring all medications to this visit? yes  Ask patient:  Are you enrolled in My Chart yes  If no ask patient if they would like to enroll.

## 2021-02-10 ENCOUNTER — Telehealth (HOSPITAL_COMMUNITY): Payer: Self-pay

## 2021-02-10 NOTE — Telephone Encounter (Signed)
Called pt to see if she is interested in the cardiac rehab program, pt stated that she is not interested at this time, I advised pt to call back if anything changes. Closed referral

## 2021-02-11 ENCOUNTER — Other Ambulatory Visit: Payer: Self-pay

## 2021-02-11 ENCOUNTER — Encounter (HOSPITAL_COMMUNITY): Payer: Self-pay | Admitting: Family Medicine

## 2021-02-11 ENCOUNTER — Observation Stay (HOSPITAL_COMMUNITY)
Admission: EM | Admit: 2021-02-11 | Discharge: 2021-02-12 | Disposition: A | Discharge status: Home / Self Care | Payer: Medicare Other | Attending: Internal Medicine | Admitting: Internal Medicine

## 2021-02-11 ENCOUNTER — Emergency Department (HOSPITAL_COMMUNITY): Payer: Medicare Other

## 2021-02-11 DIAGNOSIS — Z743 Need for continuous supervision: Secondary | ICD-10-CM | POA: Diagnosis not present

## 2021-02-11 DIAGNOSIS — I2510 Atherosclerotic heart disease of native coronary artery without angina pectoris: Secondary | ICD-10-CM | POA: Diagnosis not present

## 2021-02-11 DIAGNOSIS — E876 Hypokalemia: Secondary | ICD-10-CM | POA: Diagnosis not present

## 2021-02-11 DIAGNOSIS — M797 Fibromyalgia: Secondary | ICD-10-CM

## 2021-02-11 DIAGNOSIS — I10 Essential (primary) hypertension: Secondary | ICD-10-CM | POA: Insufficient documentation

## 2021-02-11 DIAGNOSIS — Z7984 Long term (current) use of oral hypoglycemic drugs: Secondary | ICD-10-CM | POA: Insufficient documentation

## 2021-02-11 DIAGNOSIS — Z20822 Contact with and (suspected) exposure to covid-19: Secondary | ICD-10-CM | POA: Diagnosis not present

## 2021-02-11 DIAGNOSIS — R0789 Other chest pain: Secondary | ICD-10-CM | POA: Diagnosis not present

## 2021-02-11 DIAGNOSIS — Z79899 Other long term (current) drug therapy: Secondary | ICD-10-CM | POA: Diagnosis not present

## 2021-02-11 DIAGNOSIS — J449 Chronic obstructive pulmonary disease, unspecified: Secondary | ICD-10-CM | POA: Diagnosis not present

## 2021-02-11 DIAGNOSIS — Z87891 Personal history of nicotine dependence: Secondary | ICD-10-CM | POA: Insufficient documentation

## 2021-02-11 DIAGNOSIS — R079 Chest pain, unspecified: Principal | ICD-10-CM | POA: Insufficient documentation

## 2021-02-11 DIAGNOSIS — I499 Cardiac arrhythmia, unspecified: Secondary | ICD-10-CM | POA: Diagnosis not present

## 2021-02-11 DIAGNOSIS — E119 Type 2 diabetes mellitus without complications: Secondary | ICD-10-CM | POA: Diagnosis not present

## 2021-02-11 DIAGNOSIS — Z7982 Long term (current) use of aspirin: Secondary | ICD-10-CM | POA: Diagnosis not present

## 2021-02-11 DIAGNOSIS — R072 Precordial pain: Secondary | ICD-10-CM

## 2021-02-11 DIAGNOSIS — R6889 Other general symptoms and signs: Secondary | ICD-10-CM | POA: Diagnosis not present

## 2021-02-11 LAB — TROPONIN I (HIGH SENSITIVITY): Troponin I (High Sensitivity): 17 ng/L (ref <18)

## 2021-02-11 LAB — CBC
HCT: 34.3 % — ABNORMAL LOW (ref 36.0–46.0)
Hemoglobin: 11.2 g/dL — ABNORMAL LOW (ref 12.0–15.0)
MCH: 29.5 pg (ref 26.0–34.0)
MCHC: 32.7 g/dL (ref 30.0–36.0)
MCV: 90.3 fL (ref 80.0–100.0)
Platelets: 323 10*3/uL (ref 150–400)
RBC: 3.8 MIL/uL — ABNORMAL LOW (ref 3.87–5.11)
RDW: 14 % (ref 11.5–15.5)
WBC: 12.7 10*3/uL — ABNORMAL HIGH (ref 4.0–10.5)
nRBC: 0 % (ref 0.0–0.2)

## 2021-02-11 LAB — BASIC METABOLIC PANEL
Anion gap: 8 (ref 5–15)
BUN: 8 mg/dL (ref 8–23)
CO2: 24 mmol/L (ref 22–32)
Calcium: 8.8 mg/dL — ABNORMAL LOW (ref 8.9–10.3)
Chloride: 103 mmol/L (ref 98–111)
Creatinine, Ser: 0.85 mg/dL (ref 0.44–1.00)
GFR, Estimated: >60 mL/min (ref >60)
Glucose, Bld: 125 mg/dL — ABNORMAL HIGH (ref 70–99)
Potassium: 3.3 mmol/L — ABNORMAL LOW (ref 3.5–5.1)
Sodium: 135 mmol/L (ref 135–145)

## 2021-02-11 LAB — PROTIME-INR
INR: 1 (ref 0.8–1.2)
Prothrombin Time: 12.9 seconds (ref 11.4–15.2)

## 2021-02-11 MED ORDER — HYDROCHLOROTHIAZIDE 12.5 MG PO CAPS
12.5000 mg | ORAL_CAPSULE | Freq: Every day | ORAL | Status: DC
  Administered 2021-02-12: 12.5 mg via ORAL
  Filled 2021-02-11: qty 1

## 2021-02-11 MED ORDER — ALBUTEROL SULFATE HFA 108 (90 BASE) MCG/ACT IN AERS
2.0000 | INHALATION_SPRAY | RESPIRATORY_TRACT | Status: DC | PRN
  Administered 2021-02-12: 2 via RESPIRATORY_TRACT
  Filled 2021-02-11: qty 6.7

## 2021-02-11 MED ORDER — ALPRAZOLAM 0.25 MG PO TABS
0.2500 mg | ORAL_TABLET | Freq: Every evening | ORAL | Status: DC | PRN
  Administered 2021-02-12: 0.25 mg via ORAL
  Filled 2021-02-11: qty 1

## 2021-02-11 MED ORDER — FLUTICASONE FUROATE-VILANTEROL 200-25 MCG/INH IN AEPB
1.0000 | INHALATION_SPRAY | Freq: Every day | RESPIRATORY_TRACT | Status: DC
  Filled 2021-02-11: qty 28

## 2021-02-11 MED ORDER — OXYCODONE-ACETAMINOPHEN 10-325 MG PO TABS: 1.0000 | ORAL_TABLET | Freq: Every day | ORAL | Status: DC | PRN

## 2021-02-11 MED ORDER — LOSARTAN POTASSIUM 50 MG PO TABS
50.0000 mg | ORAL_TABLET | Freq: Every day | ORAL | Status: DC
  Administered 2021-02-12: 50 mg via ORAL
  Filled 2021-02-11: qty 1

## 2021-02-11 MED ORDER — BUDESON-GLYCOPYRROL-FORMOTEROL 160-9-4.8 MCG/ACT IN AERO: 2.0000 | INHALATION_SPRAY | Freq: Every day | RESPIRATORY_TRACT | Status: DC

## 2021-02-11 MED ORDER — ONDANSETRON HCL 4 MG PO TABS: 4.0000 mg | ORAL_TABLET | Freq: Four times a day (QID) | ORAL | Status: DC | PRN

## 2021-02-11 MED ORDER — UMECLIDINIUM BROMIDE 62.5 MCG/INH IN AEPB
1.0000 | INHALATION_SPRAY | Freq: Every day | RESPIRATORY_TRACT | Status: DC
  Filled 2021-02-11: qty 7

## 2021-02-11 MED ORDER — ACETAMINOPHEN 325 MG PO TABS: 650.0000 mg | ORAL_TABLET | Freq: Four times a day (QID) | ORAL | Status: DC | PRN

## 2021-02-11 MED ORDER — ATORVASTATIN CALCIUM 80 MG PO TABS
80.0000 mg | ORAL_TABLET | Freq: Every day | ORAL | Status: DC
  Administered 2021-02-12: 80 mg via ORAL
  Filled 2021-02-11: qty 1

## 2021-02-11 MED ORDER — OXYCODONE HCL 5 MG PO TABS
5.0000 mg | ORAL_TABLET | Freq: Every day | ORAL | Status: DC | PRN
  Administered 2021-02-12: 5 mg via ORAL
  Filled 2021-02-11: qty 1

## 2021-02-11 MED ORDER — PANTOPRAZOLE SODIUM 40 MG PO TBEC
40.0000 mg | DELAYED_RELEASE_TABLET | Freq: Every day | ORAL | Status: DC
  Administered 2021-02-12: 40 mg via ORAL
  Filled 2021-02-11: qty 1

## 2021-02-11 MED ORDER — LOSARTAN POTASSIUM-HCTZ 100-25 MG PO TABS: 0.5000 | ORAL_TABLET | Freq: Every day | ORAL | Status: DC

## 2021-02-11 MED ORDER — ASPIRIN EC 81 MG PO TBEC
81.0000 mg | DELAYED_RELEASE_TABLET | Freq: Every day | ORAL | Status: DC
Start: 2021-02-12 — End: 2021-02-12
  Administered 2021-02-12: 81 mg via ORAL
  Filled 2021-02-11: qty 1

## 2021-02-11 MED ORDER — METOPROLOL TARTRATE 25 MG PO TABS
25.0000 mg | ORAL_TABLET | Freq: Two times a day (BID) | ORAL | Status: DC
  Administered 2021-02-12: 25 mg via ORAL
  Filled 2021-02-11: qty 1

## 2021-02-11 MED ORDER — GABAPENTIN 300 MG PO CAPS
300.0000 mg | ORAL_CAPSULE | Freq: Two times a day (BID) | ORAL | Status: DC
  Administered 2021-02-12: 300 mg via ORAL
  Filled 2021-02-11: qty 1

## 2021-02-11 MED ORDER — POTASSIUM CHLORIDE CRYS ER 20 MEQ PO TBCR
40.0000 meq | EXTENDED_RELEASE_TABLET | Freq: Once | ORAL | Status: DC
  Filled 2021-02-11: qty 2

## 2021-02-11 MED ORDER — TICAGRELOR 90 MG PO TABS
90.0000 mg | ORAL_TABLET | Freq: Two times a day (BID) | ORAL | Status: DC
  Administered 2021-02-12 (×2): 90 mg via ORAL
  Filled 2021-02-11 (×2): qty 1

## 2021-02-11 MED ORDER — ACETAMINOPHEN 650 MG RE SUPP: 650.0000 mg | Freq: Four times a day (QID) | RECTAL | Status: DC | PRN

## 2021-02-11 MED ORDER — SENNOSIDES-DOCUSATE SODIUM 8.6-50 MG PO TABS: 1.0000 | ORAL_TABLET | Freq: Every evening | ORAL | Status: DC | PRN

## 2021-02-11 MED ORDER — ONDANSETRON HCL 4 MG/2ML IJ SOLN: 4.0000 mg | Freq: Four times a day (QID) | INTRAMUSCULAR | Status: DC | PRN

## 2021-02-11 MED ORDER — OXYCODONE-ACETAMINOPHEN 5-325 MG PO TABS
1.0000 | ORAL_TABLET | Freq: Every day | ORAL | Status: DC | PRN
  Administered 2021-02-12: 1 via ORAL
  Filled 2021-02-11: qty 1

## 2021-02-11 MED ORDER — ENOXAPARIN SODIUM 30 MG/0.3ML ~~LOC~~ SOLN
30.0000 mg | SUBCUTANEOUS | Status: DC
  Administered 2021-02-12: 30 mg via SUBCUTANEOUS
  Filled 2021-02-11: qty 0.3

## 2021-02-11 NOTE — H&P (Signed)
History and Physical    Megan [name] RJJ:[id number] DOB: 03/05/1946 DOA: 02/11/2021  PCP: [name], MD   Patient coming from: home   Chief Complaint: Chest pain   HPI: Megan [name] is a 75 y.o. female with medical history significant for hypertension, COPD, fibromyalgia, PAD, and CAD status post remote CABG and recent NSTEMI status post PCI on 02/02/2021 presents emergency department with chest pain.  Patient reports that she was in her usual state today when she developed an aching sensation in her left chest at approximately 5 PM.  Symptoms have been constant, localized, without any alleviating or exacerbating factors identified, and without any associated shortness of breath, nausea, or diaphoresis.  Patient reports that symptoms are similar to what she experienced prior to her CABG many years ago but unlike the discomfort she was experiencing with the recent NSTEMI.  She denies any recent fevers, chills, cough, or leg swelling.  Chest is tender but patient thinks this may be related to her fibromyalgia.  ED Course: Upon arrival to the ED, patient is found to be afebrile, saturating well on room air, and with stable blood pressure.  EKG features sinus rhythm with T wave inversions.  Chest x-rays negative for acute cardiopulmonary disease.  Chemistry panel notable for potassium 3.3.  CBC features a leukocytosis to 12,700 and a mild normocytic anemia.  Initial high-sensitivity troponin is 17.  ED physician discussed the case with cardiology who reviewed the EKG and recommended observation on the medical service with repeat troponin measurements.  Second troponin and COVID-19 screening test not yet resulted.  Review of Systems:  All other systems reviewed and apart from HPI, are negative.  Past Medical History:  Diagnosis Date  . AAA (abdominal aortic aneurysm) ([place])    a. s/p stent grafting in 2009.  . Adenomatous colon polyp 05/2001  . Adenomatous duodenal polyp   . Allergy    . Anginal pain ([place])   . COPD (chronic obstructive pulmonary disease) ([place])    pt denies COPD  . Coronary artery disease    a. s/p CABG in 1995;  b. 05/2013 Neg MV, EF 82%;  c. 06/2013 Cath: LM nl, LAD 40-50p, LCX nl, RCA 80p/170m, VG->RCA->PDA->PLSA min irregs, LIMA->LAD atretic, vigorous LV fxn.  . Diabetes mellitus without complication (HCC)    history of, resolved. 2017  . Diverticulosis   . Eczema, dyshidrotic   . Eczema, dyshidrotic 1986   Dr. Mathis Fare  . Emphysema of lung ([place])   . Fatty liver   . Fibromyalgia   . GERD (gastroesophageal reflux disease)   . Hyperlipidemia   . Hypertension   . Migraine   . Myocardial infarction ([place])   . Osteoarthritis   . Osteoporosis    unsure, having a bone scan soon  . Peripheral arterial disease (HCC)    left leg diagnosed by Dr Mare Ferrari  . Renal artery stenosis ([place])    a. 2008 s/p PTA    Past Surgical History:  Procedure Laterality Date  . ABDOMINAL AORTAGRAM N/A 03/06/2014   Procedure: ABDOMINAL AORTAGRAM;  Surgeon: Rosetta Posner, MD;  Location: [facility] CATH LAB;  Service: Cardiovascular;  Laterality: N/A;  . ABDOMINAL AORTIC ANEURYSM REPAIR  2009   stenting  . ABDOMINAL HYSTERECTOMY     Total, 1992  . BREAST BIOPSY    . CARDIAC CATHETERIZATION    . CARDIOVASCULAR STRESS TEST  11/11/2009   normal study  . CARPAL TUNNEL RELEASE  08/1993   left wrist  .  CARPAL TUNNEL RELEASE  01/1997   right  . CATARACT EXTRACTION, BILATERAL  2021  . CORONARY ARTERY BYPASS GRAFT  07/1994   Triple by Dr Ceasar Mons  . CORONARY STENT INTERVENTION N/A 02/02/2021   Procedure: CORONARY STENT INTERVENTION;  Surgeon: Nelva Bush, MD;  Location: [facility] [place] CV LAB;  Service: Cardiovascular;  Laterality: N/A;  . dental implants    . EYE SURGERY  03/2020   cataract  . HAND SURGERY Right 09/2002   Right hand pulley release  . LEFT HEART CATH AND CORS/GRAFTS ANGIOGRAPHY N/A 02/02/2021   Procedure: LEFT HEART CATH AND CORS/GRAFTS ANGIOGRAPHY;   Surgeon: Nelva Bush, MD;  Location: [facility] CV LAB;  Service: Cardiovascular;  Laterality: N/A;  . LEFT HEART CATHETERIZATION WITH CORONARY/GRAFT ANGIOGRAM N/A 06/11/2013   Procedure: LEFT HEART CATHETERIZATION WITH [name];  Surgeon: Thayer Headings, MD;  Location: [facility] CATH LAB;  Service: Cardiovascular;  Laterality: N/A;  . MULTIPLE TOOTH EXTRACTIONS  2000   All upper teeth.  Has full dneture.   [name] RENAL ARTERY STENT  2008  . SHOULDER ARTHROSCOPY WITH ROTATOR CUFF REPAIR Right 09/27/2018   Procedure: Right shoulder mini open rotator cuff repair;  Surgeon: Susa Day, MD;  Location: [facility] ORS;  Service: Orthopedics;  Laterality: Right;  90 mins  . thyroid cyst apiration  05/1997 and 07/1998  . TONSILECTOMY, ADENOIDECTOMY, BILATERAL MYRINGOTOMY AND TUBES  1989  . TONSILLECTOMY    . TOTAL ABDOMINAL HYSTERECTOMY  1992    Social History:   reports that she quit smoking about 25 years ago. Her smoking use included cigarettes. She smoked 0.00 packs per day for 37.00 years. She has never used smokeless tobacco. She reports current alcohol use of about 6.0 - 10.0 standard drinks of alcohol per week. She reports that she does not use drugs.  Allergies  Allergen Reactions  . Anticoagulant Compound Itching and Other (See Comments)    Hands and feet tingle and itch.   Darlin Coco [Valdecoxib] Other (See Comments)    Increased lft's   . Hydrocod Polst-Cpm Polst Er Rash  . Lipitor [Atorvastatin] Other (See Comments)    Leg weakness   . Mevacor [Lovastatin] Other (See Comments)    Muscle weakness   . Plavix [Clopidogrel Bisulfate] Itching and Other (See Comments)    Hands and feet tingle and itch  . Zocor [Simvastatin] Other (See Comments)    Bones hurt   . Doxycycline Diarrhea    N/v/d  . Metoprolol Other (See Comments)    Hair loss  . Morphine And Related Itching and Nausea Only  . Codeine Itching and Rash    Hyper-active  . Lisinopril Cough    Family History   Problem Relation Age of Onset  . Heart disease Mother   . Heart attack Mother   . Heart disease Father        before age 32  . Heart attack Father   . Heart disease Sister        before age 26  . Diabetes Sister   . Hyperlipidemia Sister   . Hypertension Sister   . Heart attack Sister   . Liver cancer Sister   . Breast cancer Sister   . Heart disease Brother   . Diabetes Brother   . Hyperlipidemia Brother   . Hypertension Brother   . Heart attack Brother   . AAA (abdominal aortic aneurysm) Brother   . Colon cancer Paternal Grandmother   . Heart disease Brother   .  Colon cancer Cousin        first cousin on father's side  . Pancreatic cancer Neg Hx   . Rectal cancer Neg Hx   . Stomach cancer Neg Hx   . Esophageal cancer Neg Hx      Prior to Admission medications   Medication Sig Start Date End Date Taking? Authorizing Provider  Accu-Chek FastClix Lancets MISC Check blood sugar once daily PRN 07/30/19   [name], MD  albuterol (PROVENTIL HFA;VENTOLIN HFA) 108 (90 Base) MCG/ACT inhaler Inhale 2 puffs into the lungs every 4 (four) hours as needed for wheezing or shortness of breath. 11/13/18   [name], MD  ALPRAZolam Duanne Moron) 0.25 MG tablet Take 1 tablet (0.25 mg total) by mouth at bedtime as needed for sleep. 09/15/20   [place], Peter M, MD  aspirin EC 81 MG tablet Take 1 tablet (81 mg total) by mouth daily. 05/09/20   [place], Peter M, MD  atorvastatin (LIPITOR) 80 MG tablet TAKE 1 TABLET BY MOUTH EVERY DAY Patient taking differently: Take 80 mg by mouth daily. 08/18/20   [place], Peter M, MD  augmented betamethasone dipropionate (DIPROLENE-AF) 0.05 % cream Apply 1 application topically daily as needed (rash). For hands 10/01/15   [provider]  B Complex-Biotin-FA (B-100 COMPLEX PO) Take 1 tablet by mouth at bedtime.     [provider]  Budeson-Glycopyrrol-Formoterol (BREZTRI AEROSPHERE) 160-9-4.8 MCG/ACT AERO Inhale 2 puffs into the lungs in the  morning and at bedtime. Patient taking differently: Inhale 1 puff into the lungs daily as needed (shortness of breath). 02/27/20   [name], MD  ciclopirox (LOPROX) 0.77 % cream Apply 1 application topically daily as needed (hands fungus).     [provider]  CORDRAN 4 MCG/SQCM TAPE Apply 1 each topically daily as needed (Rash).  05/17/19   [provider]  cycloSPORINE (RESTASIS) 0.05 % ophthalmic emulsion Place 1 drop into both eyes 2 (two) times daily.     [provider]  gabapentin (NEURONTIN) 300 MG capsule Take 1-2 capsules (300-600 mg total) by mouth 3 (three) times daily. Patient taking differently: Take 300 mg by mouth 2 (two) times daily. 11/15/19   Lomax, Amy, NP  glucose blood (ACCU-CHEK SMARTVIEW) test strip CHECK BLOOD SUGAR ONCE DAILY AS NEEDED 01/17/20   [name], MD  losartan-hydrochlorothiazide (HYZAAR) 100-25 MG tablet Take 0.5 tablets by mouth daily. 02/03/21   Cheryln Manly, NP  metoprolol tartrate (LOPRESSOR) 25 MG tablet Take 1 tablet (25 mg total) by mouth 2 (two) times daily. 02/03/21   Cheryln Manly, NP  Multiple Vitamins-Minerals (MULTIVITAMIN WITH MINERALS) tablet Take 1 tablet by mouth at bedtime.    [provider]  nitroGLYCERIN (NITROSTAT) 0.4 MG SL tablet Place 1 tablet (0.4 mg total) under the tongue every 5 (five) minutes as needed for chest pain. Patient not taking: Reported on 02/05/2021 01/09/20   Martin[place], Peter M, MD  nystatin (MYCOSTATIN) 100000 UNIT/ML suspension Take 5 mLs (500,000 Units total) by mouth 4 (four) times daily. 01/19/21   [name], MD  omeprazole (PRILOSEC) 40 MG capsule TAKE 1 CAPSULE BY MOUTH TWICE A DAY--NEED APPT FOR FURTHER REFILLS Patient taking differently: Take 40 mg by mouth in the morning and at bedtime. 01/21/21   Ladene Artist, MD  ondansetron (ZOFRAN) 8 MG tablet Take by mouth every 8 (eight) hours as needed for nausea or vomiting. Patient not taking: Reported on 02/05/2021     [provider]  oxyCODONE-acetaminophen (PERCOCET) 10-325 MG tablet Take 1 tablet by mouth every 6 (six) hours as needed for pain. Patient taking differently: Take 1 tablet by mouth daily as needed for pain. 06/19/20 06/19/21  Meyran, Ocie Cornfield, NP  potassium chloride (KLOR-CON) 10 MEQ tablet Take 1 tablet (10 mEq total) by mouth 2 (two) times daily. 01/13/21   Rai, Vernelle Emerald, MD  saccharomyces boulardii (FLORASTOR) 250 MG capsule Take 1 capsule (250 mg total) by mouth 2 (two) times daily. Any probiotic available is okay Patient not taking: Reported on 02/05/2021 01/13/21   Rai, Vernelle Emerald, MD  simethicone (MYLICON) 80 MG chewable tablet Chew 1 tablet (80 mg total) by mouth 4 (four) times daily as needed for flatulence (bloating). also available OTC 01/13/21   Rai, Vernelle Emerald, MD  ticagrelor (BRILINTA) 90 MG TABS tablet Take 1 tablet (90 mg total) by mouth 2 (two) times daily. 02/03/21   Cheryln Manly, NP  torsemide (DEMADEX) 20 MG tablet Take 0.5 tablets (10 mg total) by mouth daily as needed (fluid). 01/13/21   Rai, Vernelle Emerald, MD  tretinoin (RETIN-A) 0.1 % cream Apply 1 application topically every other day. In the evening    [provider]  VITAMIN D, ERGOCALCIFEROL, PO Take 1,000 Units by mouth every evening.     [provider]    Physical Exam: Vitals:   02/11/21 2017 02/11/21 2035 02/11/21 2200 02/11/21 2215  BP: (!) 155/62  (!) 92/53 (!) 149/64  Pulse: 89  86 84  Resp: 18  14 19   Temp: 98.5 F (36.9 C)     TempSrc: Oral     SpO2: 98%  97% 98%  Weight:  56.2 kg    Height:  5' (1.524 m)      Constitutional: NAD, calm  Eyes: PERTLA, lids and conjunctivae normal ENMT: Mucous membranes are moist. Posterior pharynx clear of any exudate or lesions.   Neck: normal, supple, no masses, no thyromegaly Respiratory: no wheezing, no crackles. No accessory muscle use.  Cardiovascular: S1 & S2 heard, regular rate and rhythm. No extremity edema.   Abdomen: No  distension, no tenderness, soft. Bowel sounds active.  Musculoskeletal: no clubbing / cyanosis. No joint deformity upper and lower extremities.   Skin: no significant rashes, lesions, ulcers. Warm, dry, well-perfused. Neurologic: CN 2-12 grossly intact. Sensation intact. Moving all extremities.  Psychiatric: Alert and oriented to person, place, and situation. Pleasant and cooperative.    Labs and Imaging on Admission: I have personally reviewed following labs and imaging studies  CBC: Recent Labs  Lab 02/11/21 2036  WBC 12.7*  HGB 11.2*  HCT 34.3*  MCV 90.3  PLT 638   Basic Metabolic Panel: Recent Labs  Lab 02/11/21 2036  NA 135  K 3.3*  CL 103  CO2 24  GLUCOSE 125*  BUN 8  CREATININE 0.85  CALCIUM 8.8*   GFR: Estimated Creatinine Clearance: 44.3 mL/min (by C-G formula based on SCr of 0.85 mg/dL). Liver Function Tests: No results for input(s): AST, ALT, ALKPHOS, BILITOT, PROT, ALBUMIN in the last 168 hours. No results for input(s): LIPASE, AMYLASE in the last 168 hours. No results for input(s): AMMONIA in the last 168 hours. Coagulation Profile: Recent Labs  Lab 02/11/21 2036  INR 1.0   Cardiac Enzymes: No results for input(s): CKTOTAL, CKMB, CKMBINDEX, TROPONINI in the last 168 hours. BNP (last 3 results) No results for input(s): PROBNP in the last 8760 hours. HbA1C: No results for input(s): HGBA1C in the last  72 hours. CBG: No results for input(s): GLUCAP in the last 168 hours. Lipid Profile: No results for input(s): CHOL, HDL, LDLCALC, TRIG, CHOLHDL, LDLDIRECT in the last 72 hours. Thyroid Function Tests: No results for input(s): TSH, T4TOTAL, FREET4, T3FREE, THYROIDAB in the last 72 hours. Anemia Panel: No results for input(s): VITAMINB12, FOLATE, FERRITIN, TIBC, IRON, RETICCTPCT in the last 72 hours. Urine analysis:    Component Value Date/Time   COLORURINE AMBER (A) 01/05/2021 2349   APPEARANCEUR HAZY (A) 01/05/2021 2349   LABSPEC 1.012 01/05/2021  2349   PHURINE 5.0 01/05/2021 2349   GLUCOSEU NEGATIVE 01/05/2021 2349   GLUCOSEU NEGATIVE 09/09/2015 1611   HGBUR MODERATE (A) 01/05/2021 2349   BILIRUBINUR NEGATIVE 01/05/2021 2349   BILIRUBINUR negative 01/13/2018 [place] 01/05/2021 2349   PROTEINUR 30 (A) 01/05/2021 2349   UROBILINOGEN 0.2 01/13/2018 1131   UROBILINOGEN 0.2 09/09/2015 1611   NITRITE NEGATIVE 01/05/2021 2349   LEUKOCYTESUR LARGE (A) 01/05/2021 2349   Sepsis Labs: @LABRCNTIP (procalcitonin:4,lacticidven:4) )No results found for this or any previous visit (from the past 240 hour(s)).   Radiological Exams on Admission: DG Chest 2 View  Result Date: 02/11/2021 CLINICAL DATA:  Chest pain EXAM: CHEST - 2 VIEW COMPARISON:  01/31/2021 FINDINGS: The patient is status post prior median sternotomy. Aortic calcifications are noted. There are dense mitral annular calcifications. There is no pneumothorax. No large pleural effusion. No definite acute displaced fracture. The patient appears to be status post prior EVAR and the lumbar fusion. IMPRESSION: No active cardiopulmonary disease. Electronically Signed   By: Constance Holster M.D.   On: 02/11/2021 21:04    EKG: Independently reviewed. Sinus rhythm, T-wave inversions, similar to prior.   Assessment/Plan   1. Chest pain; CAD  - Patient with CAD, hx of CABG, and recent NSTEMI s/p successful PCI on 2/28 presents with chest pain - ED discussed with cardiology who reviewed EKG and recommended observing overnight with repeat troponin measurements  - Continue cardiac monitoring, repeat troponin, continue ASA, Brillinta, atorvastatin    2. Hypokalemia  - Serum potassium is 3.3, mag level pending  - Replace, repeat chem panel in am    3. Hypertension  - Continue metoprolol, losartan-HCTZ    4. COPD  - Stable, continue inhalers    5. Fibromyalgia  - Continue home regimen     DVT prophylaxis: Lovenox  Code Status: Full  Level of Care: Level of care:  Telemetry Cardiac Family Communication: None present  Disposition Plan:  Patient is from: Home  Anticipated d/c is to: Home  Anticipated d/c date is: 02/12/21 Patient currently: Pending repeat troponin  Consults called: ED discussed with cardiology  Admission status: Observation     Vianne Bulls, MD [facility]  02/11/2021, 11:50 PM

## 2021-02-11 NOTE — ED Triage Notes (Signed)
Pt c/o CP that began a few hours ago; took 650 asa, 1 nitro and Gas-X. States has had no relief. Had an NSTEMI last week with placement of stents. No distress noted at this time.  Has had mild SOB, with no n/v.

## 2021-02-11 NOTE — ED Provider Notes (Signed)
[facility] EMERGENCY DEPARTMENT Provider Note   CSN: [id number] Arrival date & time: 02/11/21  2011     History Chief Complaint  Patient presents with  . Chest Pain    Megan [name] is a 75 y.o. female.  The history is provided by the patient and medical records.  Chest Pain  Megan [name] is a 75 y.o. female who presents to the Emergency Department complaining of chest pain. She presents the emergency department complaining of left sided chest pain that started at 5 PM today. Pain is described as a constant aching sensation. It is nonradiating. She took two aspirin as well as one nitroglycerin and Gas-X at home with no relief in symptoms. Her pain continued and she called EMS. They provided additional nitroglycerin with no relief. She rates her pain at 5/10. She has no associated fevers, difficulty breathing, abdominal pain, vomiting. She did feel clammy at times. She has chronic intermittent lower extremity edema and this is at her baseline. She was recently admitted to the hospital for an nonSTEMI and had a cardiac stent placed. Since that time her medications have been adjusted and she was started on brilinta. Please note that chart documented blood pressure of 92/53 is an accurate and her blood pressure is in the 767H systolic.    Past Medical History:  Diagnosis Date  . AAA (abdominal aortic aneurysm) ([place])    a. s/p stent grafting in 2009.  . Adenomatous colon polyp 05/2001  . Adenomatous duodenal polyp   . Allergy   . Anginal pain ([place])   . COPD (chronic obstructive pulmonary disease) ([place])    pt denies COPD  . Coronary artery disease    a. s/p CABG in 1995;  b. 05/2013 Neg MV, EF 82%;  c. 06/2013 Cath: LM nl, LAD 40-50p, LCX nl, RCA 80p/118m, VG->RCA->PDA->PLSA min irregs, LIMA->LAD atretic, vigorous LV fxn.  . Diabetes mellitus without complication (HCC)    history of, resolved. 2017  . Diverticulosis   . Eczema, dyshidrotic   . Eczema,  dyshidrotic 1986   Dr. Mathis Fare  . Emphysema of lung ([place])   . Fatty liver   . Fibromyalgia   . GERD (gastroesophageal reflux disease)   . Hyperlipidemia   . Hypertension   . Migraine   . Myocardial infarction ([place])   . Osteoarthritis   . Osteoporosis    unsure, having a bone scan soon  . Peripheral arterial disease (HCC)    left leg diagnosed by Dr Mare Ferrari  . Renal artery stenosis ([place])    a. 2008 s/p PTA    Patient Active Problem List   Diagnosis Date Noted  . Hypomagnesemia 02/03/2021  . NSTEMI (non-ST elevated myocardial infarction) ([place]) 01/31/2021  . AKI (acute kidney injury) ([place]) 01/06/2021  . Diarrhea 01/06/2021  . COPD with chronic bronchitis ([place]) 09/28/2018  . Primary hypertension 07/10/2018  . IFG (impaired fasting glucose) 11/10/2017  . DJD (degenerative joint disease), lumbosacral 11/10/2017  . Fibromyalgia 11/10/2017  . Dyshidrotic eczema 11/10/2017  . GERD (gastroesophageal reflux disease) 11/10/2017  . Osteopenia 11/10/2017  . Does use hearing aid 11/10/2017  . 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  . Fatty liver 11/10/2017  . Coronary artery disease   . Peripheral vascular disease with claudication ([place])   . 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  . Hypokalemia 03/23/2012  . History of abdominal aortic aneurysm repair 05/18/2011  . History of renal artery stenosis 05/18/2011  . Hyperlipidemia LDL goal <70     Past Surgical History:  Procedure Laterality Date  . ABDOMINAL AORTAGRAM N/A 03/06/2014   Procedure: ABDOMINAL AORTAGRAM;  Surgeon: Rosetta Posner, MD;  Location: [facility] CATH LAB;  Service: Cardiovascular;  Laterality: N/A;  . ABDOMINAL AORTIC ANEURYSM REPAIR  2009   stenting  . ABDOMINAL HYSTERECTOMY     Total, 1992  . BREAST BIOPSY    . CARDIAC CATHETERIZATION    . CARDIOVASCULAR STRESS TEST  11/11/2009   normal study  . CARPAL TUNNEL RELEASE  08/1993   left wrist  . CARPAL TUNNEL RELEASE  01/1997   right  . CATARACT EXTRACTION, BILATERAL  2021  .  CORONARY ARTERY BYPASS GRAFT  07/1994   Triple by Dr Ceasar Mons  . CORONARY STENT INTERVENTION N/A 02/02/2021   Procedure: CORONARY STENT INTERVENTION;  Surgeon: Nelva Bush, MD;  Location: [facility] CV LAB;  Service: Cardiovascular;  Laterality: N/A;  . dental implants    . EYE SURGERY  03/2020   cataract  . HAND SURGERY Right 09/2002   Right hand pulley release  . LEFT HEART CATH AND CORS/GRAFTS ANGIOGRAPHY N/A 02/02/2021   Procedure: LEFT HEART CATH AND CORS/GRAFTS ANGIOGRAPHY;  Surgeon: Nelva Bush, MD;  Location: [place] [facility] CV LAB;  Service: Cardiovascular;  Laterality: N/A;  . LEFT HEART CATHETERIZATION WITH CORONARY/GRAFT ANGIOGRAM N/A 06/11/2013   Procedure: LEFT HEART CATHETERIZATION WITH [name];  Surgeon: Thayer Headings, MD;  Location: [facility] CATH LAB;  Service: Cardiovascular;  Laterality: N/A;  . MULTIPLE TOOTH EXTRACTIONS  2000   All upper teeth.  Has full dneture.   [name] RENAL ARTERY STENT  2008  . SHOULDER ARTHROSCOPY WITH ROTATOR CUFF REPAIR Right 09/27/2018   Procedure: Right shoulder mini open rotator cuff repair;  Surgeon: Susa Day, MD;  Location: [facility] ORS;  Service: Orthopedics;  Laterality: Right;  90 mins  . thyroid cyst apiration  05/1997 and 07/1998  . TONSILECTOMY, ADENOIDECTOMY, BILATERAL MYRINGOTOMY AND TUBES  1989  . TONSILLECTOMY    . TOTAL ABDOMINAL HYSTERECTOMY  1992     OB History   No obstetric history on file.     Family History  Problem Relation Age of Onset  . Heart disease Mother   . Heart attack Mother   . Heart disease Father        before age 68  . Heart attack Father   . Heart disease Sister        before age 51  . Diabetes Sister   . Hyperlipidemia Sister   . Hypertension Sister   . Heart attack Sister   . Liver cancer Sister   . Breast cancer Sister   . Heart disease Brother   . Diabetes Brother   . Hyperlipidemia Brother   . Hypertension Brother   . Heart attack Brother   . AAA (abdominal aortic  aneurysm) Brother   . Colon cancer Paternal Grandmother   . Heart disease Brother   . Colon cancer Cousin        first cousin on father's side  . Pancreatic cancer Neg Hx   . Rectal cancer Neg Hx   . Stomach cancer Neg Hx   . Esophageal cancer Neg Hx     Social History   Tobacco Use  . Smoking status: Former Smoker    Packs/day: 0.00    Years: 37.00    Pack years: 0.00    Types: Cigarettes    Quit date: 12/07/1995    Years since quitting: 25.2  . Smokeless tobacco: Never Used  Vaping Use  .  Vaping Use: Never used  Substance Use Topics  . Alcohol use: Yes    Alcohol/week: 6.0 - 10.0 standard drinks    Types: 6 - 10 Glasses of wine per week    Comment: 2-3 glasses daily  . Drug use: No    Home Medications Prior to Admission medications   Medication Sig Start Date End Date Taking? Authorizing Provider  Accu-Chek FastClix Lancets MISC Check blood sugar once daily PRN 07/30/19   [name], MD  albuterol (PROVENTIL HFA;VENTOLIN HFA) 108 (90 Base) MCG/ACT inhaler Inhale 2 puffs into the lungs every 4 (four) hours as needed for wheezing or shortness of breath. 11/13/18   [name], MD  ALPRAZolam Duanne Moron) 0.25 MG tablet Take 1 tablet (0.25 mg total) by mouth at bedtime as needed for sleep. 09/15/20   [place], Peter M, MD  aspirin EC 81 MG tablet Take 1 tablet (81 mg total) by mouth daily. 05/09/20   [place], Peter M, MD  atorvastatin (LIPITOR) 80 MG tablet TAKE 1 TABLET BY MOUTH EVERY DAY Patient taking differently: Take 80 mg by mouth daily. 08/18/20   [place], Peter M, MD  augmented betamethasone dipropionate (DIPROLENE-AF) 0.05 % cream Apply 1 application topically daily as needed (rash). For hands 10/01/15   [provider]  B Complex-Biotin-FA (B-100 COMPLEX PO) Take 1 tablet by mouth at bedtime.     [provider]  Budeson-Glycopyrrol-Formoterol (BREZTRI AEROSPHERE) 160-9-4.8 MCG/ACT AERO Inhale 2 puffs into the lungs in the morning and at  bedtime. Patient taking differently: Inhale 1 puff into the lungs daily as needed (shortness of breath). 02/27/20   [name], MD  ciclopirox (LOPROX) 0.77 % cream Apply 1 application topically daily as needed (hands fungus).     [provider]  CORDRAN 4 MCG/SQCM TAPE Apply 1 each topically daily as needed (Rash).  05/17/19   [provider]  cycloSPORINE (RESTASIS) 0.05 % ophthalmic emulsion Place 1 drop into both eyes 2 (two) times daily.     [provider]  gabapentin (NEURONTIN) 300 MG capsule Take 1-2 capsules (300-600 mg total) by mouth 3 (three) times daily. Patient taking differently: Take 300 mg by mouth 2 (two) times daily. 11/15/19   Lomax, Amy, NP  glucose blood (ACCU-CHEK SMARTVIEW) test strip CHECK BLOOD SUGAR ONCE DAILY AS NEEDED 01/17/20   [name], MD  losartan-hydrochlorothiazide (HYZAAR) 100-25 MG tablet Take 0.5 tablets by mouth daily. 02/03/21   Cheryln Manly, NP  metoprolol tartrate (LOPRESSOR) 25 MG tablet Take 1 tablet (25 mg total) by mouth 2 (two) times daily. 02/03/21   Cheryln Manly, NP  Multiple Vitamins-Minerals (MULTIVITAMIN WITH MINERALS) tablet Take 1 tablet by mouth at bedtime.    [provider]  nitroGLYCERIN (NITROSTAT) 0.4 MG SL tablet Place 1 tablet (0.4 mg total) under the tongue every 5 (five) minutes as needed for chest pain. Patient not taking: Reported on 02/05/2021 01/09/20   [place], Peter M, MD  nystatin (MYCOSTATIN) 100000 UNIT/ML suspension Take 5 mLs (500,000 Units total) by mouth 4 (four) times daily. 01/19/21   [name], MD  omeprazole (PRILOSEC) 40 MG capsule TAKE 1 CAPSULE BY MOUTH TWICE A DAY--NEED APPT FOR FURTHER REFILLS Patient taking differently: Take 40 mg by mouth in the morning and at bedtime. 01/21/21   Ladene Artist, MD  ondansetron (ZOFRAN) 8 MG tablet Take by mouth every 8 (eight) hours as needed for nausea or vomiting. Patient not taking: Reported on 02/05/2021  [provider]  oxyCODONE-acetaminophen (PERCOCET) 10-325 MG tablet Take 1 tablet by mouth every 6 (six) hours as needed for pain. Patient taking differently: Take 1 tablet by mouth daily as needed for pain. 06/19/20 06/19/21  Meyran, Ocie Cornfield, NP  potassium chloride (KLOR-CON) 10 MEQ tablet Take 1 tablet (10 mEq total) by mouth 2 (two) times daily. 01/13/21   Rai, Vernelle Emerald, MD  saccharomyces boulardii (FLORASTOR) 250 MG capsule Take 1 capsule (250 mg total) by mouth 2 (two) times daily. Any probiotic available is okay Patient not taking: Reported on 02/05/2021 01/13/21   Rai, Vernelle Emerald, MD  simethicone (MYLICON) 80 MG chewable tablet Chew 1 tablet (80 mg total) by mouth 4 (four) times daily as needed for flatulence (bloating). also available OTC 01/13/21   Rai, Vernelle Emerald, MD  ticagrelor (BRILINTA) 90 MG TABS tablet Take 1 tablet (90 mg total) by mouth 2 (two) times daily. 02/03/21   Cheryln Manly, NP  torsemide (DEMADEX) 20 MG tablet Take 0.5 tablets (10 mg total) by mouth daily as needed (fluid). 01/13/21   Rai, Vernelle Emerald, MD  tretinoin (RETIN-A) 0.1 % cream Apply 1 application topically every other day. In the evening    [provider]  VITAMIN D, ERGOCALCIFEROL, PO Take 1,000 Units by mouth every evening.     [provider]    Allergies    Anticoagulant compound, Bextra [valdecoxib], Hydrocod polst-cpm polst er, Lipitor [atorvastatin], Mevacor [lovastatin], Plavix [clopidogrel bisulfate], Zocor [simvastatin], Doxycycline, Metoprolol, Morphine and related, Codeine, and Lisinopril  Review of Systems   Review of Systems  Cardiovascular: Positive for chest pain.  All other systems reviewed and are negative.   Physical Exam Updated Vital Signs BP (!) 149/64   Pulse 84   Temp 98.5 F (36.9 C) (Oral)   Resp 19   Ht 5' (1.524 m)   Wt 56.2 kg   SpO2 98%   BMI 24.22 kg/m   Physical Exam Vitals and nursing note reviewed.  Constitutional:      Appearance: She is  well-developed and well-nourished.  HENT:     Head: Normocephalic and atraumatic.  Cardiovascular:     Rate and Rhythm: Normal rate and regular rhythm.     Heart sounds: No murmur heard.   Pulmonary:     Effort: Pulmonary effort is normal. No respiratory distress.     Breath sounds: Normal breath sounds.  Abdominal:     Palpations: Abdomen is soft.     Tenderness: There is no abdominal tenderness. There is no guarding or rebound.  Musculoskeletal:        General: No tenderness or edema.     Comments: 1+ pitting edema to BLE  Skin:    General: Skin is warm and dry.  Neurological:     Mental Status: She is alert and oriented to person, place, and time.  Psychiatric:        Mood and Affect: Mood and affect normal.        Behavior: Behavior normal.     ED Results / Procedures / Treatments   Labs (all labs ordered are listed, but only abnormal results are displayed) Labs Reviewed  BASIC METABOLIC PANEL - Abnormal; Notable for the following components:      Result Value   Potassium 3.3 (*)    Glucose, Bld 125 (*)    Calcium 8.8 (*)    All other components within normal limits  CBC - Abnormal; Notable for the following components:  WBC 12.7 (*)    RBC 3.80 (*)    Hemoglobin 11.2 (*)    HCT 34.3 (*)    All other components within normal limits  PROTIME-INR  MAGNESIUM  TROPONIN I (HIGH SENSITIVITY)  TROPONIN I (HIGH SENSITIVITY)    EKG None  Radiology DG Chest 2 View  Result Date: 02/11/2021 CLINICAL DATA:  Chest pain EXAM: CHEST - 2 VIEW COMPARISON:  01/31/2021 FINDINGS: The patient is status post prior median sternotomy. Aortic calcifications are noted. There are dense mitral annular calcifications. There is no pneumothorax. No large pleural effusion. No definite acute displaced fracture. The patient appears to be status post prior EVAR and the lumbar fusion. IMPRESSION: No active cardiopulmonary disease. Electronically Signed   By: Constance Holster M.D.   On:  02/11/2021 21:04    Procedures Procedures   Medications Ordered in ED Medications - No data to display  ED Course  I have reviewed the triage vital signs and the nursing notes.  Pertinent labs & imaging results that were available during my care of the patient were reviewed by me and considered in my medical decision making (see chart for details).    MDM Rules/Calculators/A&P                         patient with history of coronary artery disease status post recent stent placement here for evaluation of recurrent chest pain. EKG is abnormal but unchanged when compared to prior. Initial troponin is negative. Discussed with Dr. Radford Pax with cardiology patient's case, recommends medicine consulted for admission. Patient updated findings of studies and treatment plan and she is in agreement with plan. Hospitalist consulted for admission.  Final Clinical Impression(s) / ED Diagnoses Final diagnoses:  Precordial pain    Rx / DC Orders ED Discharge Orders    None       Quintella Reichert, MD 02/11/21 2324

## 2021-02-12 ENCOUNTER — Ambulatory Visit: Payer: Medicare Other | Admitting: Cardiology

## 2021-02-12 DIAGNOSIS — E876 Hypokalemia: Secondary | ICD-10-CM | POA: Diagnosis not present

## 2021-02-12 DIAGNOSIS — R072 Precordial pain: Secondary | ICD-10-CM | POA: Diagnosis not present

## 2021-02-12 DIAGNOSIS — J449 Chronic obstructive pulmonary disease, unspecified: Secondary | ICD-10-CM | POA: Diagnosis not present

## 2021-02-12 LAB — BASIC METABOLIC PANEL
Anion gap: 7 (ref 5–15)
BUN: 7 mg/dL — ABNORMAL LOW (ref 8–23)
CO2: 26 mmol/L (ref 22–32)
Calcium: 9.2 mg/dL (ref 8.9–10.3)
Chloride: 105 mmol/L (ref 98–111)
Creatinine, Ser: 0.66 mg/dL (ref 0.44–1.00)
GFR, Estimated: >60 mL/min (ref >60)
Glucose, Bld: 130 mg/dL — ABNORMAL HIGH (ref 70–99)
Potassium: 3.4 mmol/L — ABNORMAL LOW (ref 3.5–5.1)
Sodium: 138 mmol/L (ref 135–145)

## 2021-02-12 LAB — CBC
HCT: 32.6 % — ABNORMAL LOW (ref 36.0–46.0)
Hemoglobin: 10.3 g/dL — ABNORMAL LOW (ref 12.0–15.0)
MCH: 28.5 pg (ref 26.0–34.0)
MCHC: 31.6 g/dL (ref 30.0–36.0)
MCV: 90.1 fL (ref 80.0–100.0)
Platelets: 275 10*3/uL (ref 150–400)
RBC: 3.62 MIL/uL — ABNORMAL LOW (ref 3.87–5.11)
RDW: 14 % (ref 11.5–15.5)
WBC: 9.6 10*3/uL (ref 4.0–10.5)
nRBC: 0 % (ref 0.0–0.2)

## 2021-02-12 LAB — I-STAT VENOUS BLOOD GAS, ED
Acid-Base Excess: 4 mmol/L — ABNORMAL HIGH (ref 0.0–2.0)
Bicarbonate: 27.5 mmol/L (ref 20.0–28.0)
Calcium, Ion: 1.25 mmol/L (ref 1.15–1.40)
HCT: 30 % — ABNORMAL LOW (ref 36.0–46.0)
Hemoglobin: 10.2 g/dL — ABNORMAL LOW (ref 12.0–15.0)
O2 Saturation: 100 %
Potassium: 3.3 mmol/L — ABNORMAL LOW (ref 3.5–5.1)
Sodium: 141 mmol/L (ref 135–145)
TCO2: 29 mmol/L (ref 22–32)
pCO2, Ven: 37.7 mmHg — ABNORMAL LOW (ref 44.0–60.0)
pH, Ven: 7.47 — ABNORMAL HIGH (ref 7.250–7.430)
pO2, Ven: 155 mmHg — ABNORMAL HIGH (ref 32.0–45.0)

## 2021-02-12 LAB — MAGNESIUM
Magnesium: 1.5 mg/dL — ABNORMAL LOW (ref 1.7–2.4)
Magnesium: 2.3 mg/dL (ref 1.7–2.4)

## 2021-02-12 LAB — SARS CORONAVIRUS 2 (TAT 6-24 HRS): SARS Coronavirus 2: NEGATIVE

## 2021-02-12 LAB — TROPONIN I (HIGH SENSITIVITY)
Troponin I (High Sensitivity): 17 ng/L (ref <18)
Troponin I (High Sensitivity): 15 ng/L (ref <18)

## 2021-02-12 MED ORDER — POTASSIUM CHLORIDE CRYS ER 10 MEQ PO TBCR
40.0000 meq | EXTENDED_RELEASE_TABLET | Freq: Once | ORAL | Status: AC
  Administered 2021-02-12: 40 meq via ORAL
  Filled 2021-02-12: qty 4

## 2021-02-12 MED ORDER — POTASSIUM CHLORIDE CRYS ER 20 MEQ PO TBCR
40.0000 meq | EXTENDED_RELEASE_TABLET | Freq: Once | ORAL | Status: AC
  Administered 2021-02-12: 40 meq via ORAL
  Filled 2021-02-12: qty 2

## 2021-02-12 MED ORDER — MAGNESIUM SULFATE 4 GM/100ML IV SOLN
4.0000 g | Freq: Once | INTRAVENOUS | Status: AC
  Administered 2021-02-12: 4 g via INTRAVENOUS
  Filled 2021-02-12: qty 100

## 2021-02-12 MED ORDER — ALUM & MAG HYDROXIDE-SIMETH 200-200-20 MG/5ML PO SUSP
30.0000 mL | Freq: Once | ORAL | Status: AC
  Administered 2021-02-12: 30 mL via ORAL
  Filled 2021-02-12: qty 30

## 2021-02-12 NOTE — ED Notes (Signed)
Pharmacy notified of need for inhalers.

## 2021-02-12 NOTE — ED Notes (Signed)
Pt complaining of SOB, vital signs stable, 100% room air with good waveform, HR 74 NSR, and RR17. Pt does not appear to be in distress, and was  repositioned for comfort, but still reports SOB. Secure chat sent to provider, waiting for response at this time.

## 2021-02-12 NOTE — ED Notes (Signed)
Pt c/o chronic back pain.

## 2021-02-12 NOTE — Discharge Summary (Signed)
Physician Discharge Summary  Negin Hegg TMH:[id number] DOB: March 05, 1946 DOA: 02/11/2021  PCP: [name], MD  Admit date: 02/11/2021 Discharge date: 02/12/2021  Admitted From: Home Disposition:  Home  Recommendations for Outpatient Follow-up:  1. Follow up with PCP as scheduled 2. Follow up with Cardiology as scheduled  Discharge Condition:Stable CODE STATUS:Full Diet recommendation: Heart healthy   Brief/Interim Summary: 75 y.o. female with medical history significant for hypertension, COPD, fibromyalgia, PAD, and CAD status post remote CABG and recent NSTEMI status post PCI on 02/02/2021 presents emergency department with chest pain.  Patient reports that she was in her usual state today when she developed an aching sensation in her left chest at approximately 5 PM.  Symptoms have been constant, localized, without any alleviating or exacerbating factors identified, and without any associated shortness of breath, nausea, or diaphoresis.  Patient reports that symptoms are similar to what she experienced prior to her CABG many years ago but unlike the discomfort she was experiencing with the recent NSTEMI.  She denies any recent fevers, chills, cough, or leg swelling.  Discharge Diagnoses:  Principal Problem:   Chest pain Active Problems:   Hypokalemia   Coronary artery disease   Primary hypertension   COPD with chronic bronchitis ([place])   1. Chest pain; CAD  - Patient with CAD, hx of CABG, and recent NSTEMI s/p successful PCI on 2/28 presents with chest pain - ED discussed with cardiology who reviewed EKG and recommended observing overnight with repeat troponin measurements  - Continued ASA, Brillinta, atorvastatin   -Troponins were serially negative. EKG was unremarkable -Pt remained stable for close outpatient f/u  2. Hypokalemia  - Serum potassium is 3.3 - Potassium replaced  3. Hypertension  - Continue metoprolol, losartan-HCTZ   4. COPD  - Stable, continue  inhalers   5. Fibromyalgia  - Continue home regimen     Discharge Instructions   Allergies as of 02/12/2021      Reactions   Anticoagulant Compound Itching, Other (See Comments)   Hands and feet tingle and itch.   Bextra [valdecoxib] Other (See Comments)   Increased lft's   Hydrocod Polst-cpm Polst Er Rash   Lipitor [atorvastatin] Other (See Comments)   Leg weakness   Mevacor [lovastatin] Other (See Comments)   Muscle weakness   Plavix [clopidogrel Bisulfate] Itching, Other (See Comments)   Hands and feet tingle and itch   Zocor [simvastatin] Other (See Comments)   Bones hurt   Doxycycline Diarrhea   N/v/d   Metoprolol Other (See Comments)   Hair loss   Morphine And Related Itching, Nausea Only   Codeine Itching, Rash   Hyper-active   Lisinopril Cough      Medication List    TAKE these medications   Accu-Chek FastClix Lancets Misc Check blood sugar once daily PRN   Accu-Chek SmartView test strip Generic drug: glucose blood CHECK BLOOD SUGAR ONCE DAILY AS NEEDED   albuterol 108 (90 Base) MCG/ACT inhaler Commonly known as: VENTOLIN HFA Inhale 2 puffs into the lungs every 4 (four) hours as needed for wheezing or shortness of breath.   ALPRAZolam 0.25 MG tablet Commonly known as: XANAX Take 1 tablet (0.25 mg total) by mouth at bedtime as needed for sleep.   aspirin EC 81 MG tablet Take 1 tablet (81 mg total) by mouth daily.   atorvastatin 80 MG tablet Commonly known as: LIPITOR TAKE 1 TABLET BY MOUTH EVERY DAY   augmented betamethasone dipropionate 0.05 % cream Commonly known as: DIPROLENE-AF  Apply 1 application topically daily as needed (rash). For hands   B-100 COMPLEX PO Take 1 tablet by mouth at bedtime.   Breztri Aerosphere 160-9-4.8 MCG/ACT Aero Generic drug: Budeson-Glycopyrrol-Formoterol Inhale 2 puffs into the lungs in the morning and at bedtime. What changed:   how much to take  when to take this  reasons to take this   ciclopirox  0.77 % cream Commonly known as: LOPROX Apply 1 application topically daily as needed (hands fungus).   Cordran 4 MCG/SQCM Tape Generic drug: Flurandrenolide Apply 1 each topically daily as needed (Rash).   cycloSPORINE 0.05 % ophthalmic emulsion Commonly known as: RESTASIS Place 1 drop into both eyes 2 (two) times daily.   gabapentin 300 MG capsule Commonly known as: NEURONTIN Take 1-2 capsules (300-600 mg total) by mouth 3 (three) times daily. What changed:   how much to take  when to take this   losartan-hydrochlorothiazide 100-25 MG tablet Commonly known as: HYZAAR Take 0.5 tablets by mouth daily.   metoprolol tartrate 25 MG tablet Commonly known as: LOPRESSOR Take 1 tablet (25 mg total) by mouth 2 (two) times daily.   multivitamin with minerals tablet Take 1 tablet by mouth at bedtime.   nitroGLYCERIN 0.4 MG SL tablet Commonly known as: NITROSTAT Place 1 tablet (0.4 mg total) under the tongue every 5 (five) minutes as needed for chest pain.   nystatin 100000 UNIT/ML suspension Commonly known as: MYCOSTATIN Take 5 mLs (500,000 Units total) by mouth 4 (four) times daily.   omeprazole 40 MG capsule Commonly known as: PRILOSEC TAKE 1 CAPSULE BY MOUTH TWICE A DAY--NEED APPT FOR FURTHER REFILLS What changed:   how much to take  how to take this  when to take this  additional instructions   ondansetron 8 MG tablet Commonly known as: ZOFRAN Take by mouth every 8 (eight) hours as needed for nausea or vomiting.   oxyCODONE-acetaminophen 10-325 MG tablet Commonly known as: Percocet Take 1 tablet by mouth every 6 (six) hours as needed for pain. What changed: when to take this   potassium chloride 10 MEQ tablet Commonly known as: KLOR-CON Take 1 tablet (10 mEq total) by mouth 2 (two) times daily.   saccharomyces boulardii 250 MG capsule Commonly known as: FLORASTOR Take 1 capsule (250 mg total) by mouth 2 (two) times daily. Any probiotic available is okay    simethicone 80 MG chewable tablet Commonly known as: MYLICON Chew 1 tablet (80 mg total) by mouth 4 (four) times daily as needed for flatulence (bloating). also available OTC   ticagrelor 90 MG Tabs tablet Commonly known as: BRILINTA Take 1 tablet (90 mg total) by mouth 2 (two) times daily.   torsemide 20 MG tablet Commonly known as: DEMADEX Take 0.5 tablets (10 mg total) by mouth daily as needed (fluid).   tretinoin 0.1 % cream Commonly known as: RETIN-A Apply 1 application topically every other day. In the evening   VITAMIN D (ERGOCALCIFEROL) PO Take 1,000 Units by mouth every evening.       Follow-up Information    [name], MD Follow up on 02/13/2021.   Specialty: Family Medicine Why: as scheduled  Contact information: [address] [place] [id number] [phone]        Martin[place], Peter M, MD .   Specialty: Cardiology Contact information: [address] [place] [address] [phone]              Allergies  Allergen Reactions  .  Anticoagulant Compound Itching and Other (See Comments)    Hands and feet tingle and itch.   Darlin Coco [Valdecoxib] Other (See Comments)    Increased lft's   . Hydrocod Polst-Cpm Polst Er Rash  . Lipitor [Atorvastatin] Other (See Comments)    Leg weakness   . Mevacor [Lovastatin] Other (See Comments)    Muscle weakness   . Plavix [Clopidogrel Bisulfate] Itching and Other (See Comments)    Hands and feet tingle and itch  . Zocor [Simvastatin] Other (See Comments)    Bones hurt   . Doxycycline Diarrhea    N/v/d  . Metoprolol Other (See Comments)    Hair loss  . Morphine And Related Itching and Nausea Only  . Codeine Itching and Rash    Hyper-active  . Lisinopril Cough    Procedures/Studies: DG Chest 2 View  Result Date: 02/11/2021 CLINICAL DATA:  Chest pain EXAM: CHEST - 2 VIEW COMPARISON:  01/31/2021 FINDINGS: The patient is status post prior median sternotomy. Aortic  calcifications are noted. There are dense mitral annular calcifications. There is no pneumothorax. No large pleural effusion. No definite acute displaced fracture. The patient appears to be status post prior EVAR and the lumbar fusion. IMPRESSION: No active cardiopulmonary disease. Electronically Signed   By: Constance Holster M.D.   On: 02/11/2021 21:04   CARDIAC CATHETERIZATION  Addendum Date: 02/02/2021   Conclusions: 1. Severe single-vessel coronary artery disease with chronic total occlusion of mid RCA. 2. Mild to moderate, diffuse LAD disease with competitive flow in the distal vessel from LIMA-LAD. 3. Small but patent LIMA-LAD. 4. Patent SVG-rPDA-rPL with diffuse ostial/proximal disease of up to 60% and tandem hazy 95% and 90% mid graft lesions. 5. Successful PCI to ostial/proximal and mid SVG-rPDA-rPL using non-overlapping Resolute Onyx 3.0 x 26 mm and 3.5 x 34 mm drug-eluting stents with 0% residual stenosis and TIMI-3 flow. 6. Left forearm hematoma following sheath removal ant TR band placement, controlled with manual compression and placement of second TR band proximal to the first band.  Recommendations: 1. Dual antiplatelet therapy with aspirin and ticagrelor for at least 12 months. 2. Aggressive secondary prevention. 3. Close monitoring of left forearm hematoma. Nelva Bush, MD [facility]   Result Date: 02/02/2021 Conclusions: 1. Severe single-vessel coronary artery disease with chronic total occlusion of mid RCA. 2. Mild to moderate, diffuse LAD disease with competitive flow in the distal vessel from LIMA-LAD. 3. Small but patent LIMA-LAD. 4. Patent SVG-rPDA-rPL with diffuse ostial/proximal disease of up to 60% and tandem hazy 95% and 90% mid graft lesions. 5. Successful PCI to ostial/proximal and mid SVG-rPDA-rPL using non-overlapping Resolute Onyx 3.0 x 26 mm and 3.5 x 34 mm drug-eluting stents with 0% residual stenosis and TIMI-3 flow. 6. Left forearm hematoma following sheath  removal ant TR band placement, controlled with manual compression and placement of second TR band proximal to the first band.  Recommendations: 1. Dual antiplatelet therapy with aspirin and ticagrelor for at least 12 months. 2. Aggressive secondary prevention. 3. Close monitoring of left forearm hematoma. Nelva Bush, MD [facility]  DG Chest Portable 1 View  Result Date: 01/31/2021 CLINICAL DATA:  Chest pain EXAM: PORTABLE CHEST 1 VIEW COMPARISON:  01/10/2021 FINDINGS: Lungs are clear. No pneumothorax or pleural effusion. Coronary artery bypass grafting has been performed. Extensive calcification of the mitral valve annulus again noted. Cardiac size within normal limits. Pulmonary vascularity is normal. IMPRESSION: No active disease. Electronically Signed   By: Fidela Salisbury MD   On:  01/31/2021 05:36   ECHOCARDIOGRAM COMPLETE  Result Date: 02/01/2021    ECHOCARDIOGRAM REPORT   Patient Name:   Megan [name] [facility] Date of Exam: 01/31/2021 Medical Rec #:  [id number]           Height:       60.0 in Accession #:    [id number]          Weight:       127.7 lb Date of Birth:  08/03/1946           BSA:          1.543 m Patient Age:    45 years            BP:           125/45 mmHg Patient Gender: F                   HR:           84 bpm. Exam Location:  Inpatient Procedure: 2D Echo Indications:    NSTEMI  History:        Patient has prior history of Echocardiogram examinations, most                 recent 01/09/2021. CAD, abdominal aortic aneurysm; Risk                 Factors:Dyslipidemia.  Sonographer:    Johny Chess Referring Phys: [id number] ROBIN [place]  1. Left ventricular ejection fraction, by estimation, is 60 to 65%. The left ventricle has normal function. The left ventricle has no regional wall motion abnormalities. Left ventricular diastolic parameters are consistent with Grade I diastolic dysfunction (impaired relaxation).  2. Right ventricular systolic function is normal. The right  ventricular size is normal. There is normal pulmonary artery systolic pressure.  3. Left atrial size was severely dilated.  4. The mitral valve is grossly normal. Mild mitral valve regurgitation: Apperance of two jets; study may underestimated regurgitation severity.  5. The aortic valve is tricuspid. Aortic valve regurgitation is not visualized. No aortic stenosis is present.  6. The inferior vena cava is normal in size with greater than 50% respiratory variability, suggesting right atrial pressure of 3 mmHg. Comparison(s): A prior study was performed on 01/09/21. No significant change from prior study. Prior images reviewed side by side. Similar to prior. FINDINGS  Left Ventricle: Left ventricular ejection fraction, by estimation, is 60 to 65%. The left ventricle has normal function. The left ventricle has no regional wall motion abnormalities. The left ventricular internal cavity size was normal in size. There is  no left ventricular hypertrophy. Left ventricular diastolic parameters are consistent with Grade I diastolic dysfunction (impaired relaxation). Right Ventricle: The right ventricular size is normal. No increase in right ventricular wall thickness. Right ventricular systolic function is normal. There is normal pulmonary artery systolic pressure. The tricuspid regurgitant velocity is 2.74 m/s, and  with an assumed right atrial pressure of 3 mmHg, the estimated right ventricular systolic pressure is 40.3 mmHg. Left Atrium: Left atrial size was severely dilated. Right Atrium: Right atrial size was normal in size. Pericardium: There is no evidence of pericardial effusion. Mitral Valve: The mitral valve is grossly normal. There is mild thickening of the mitral valve leaflet(s). Mild to moderate mitral annular calcification. Mild mitral valve regurgitation. Tricuspid Valve: The tricuspid valve is normal in structure. Tricuspid valve regurgitation is mild. Aortic Valve: The aortic valve is tricuspid. There is  mild to moderate aortic valve  annular calcification. Aortic valve regurgitation is not visualized. No aortic stenosis is present. Pulmonic Valve: The pulmonic valve was grossly normal. Pulmonic valve regurgitation is not visualized. No evidence of pulmonic stenosis. Aorta: The aortic root and ascending aorta are structurally normal, with no evidence of dilitation. Venous: The inferior vena cava is normal in size with greater than 50% respiratory variability, suggesting right atrial pressure of 3 mmHg. IAS/Shunts: The atrial septum is grossly normal.  LEFT VENTRICLE PLAX 2D LVIDd:         4.40 cm     Diastology LVIDs:         3.20 cm     LV e' medial:    7.62 cm/s LV PW:         0.90 cm     LV E/e' medial:  10.6 LV IVS:        0.90 cm     LV e' lateral:   12.40 cm/s LVOT diam:     1.70 cm     LV E/e' lateral: 6.5 LV SV:         69 LV SV Index:   45 LVOT Area:     2.27 cm  LV Volumes (MOD) LV vol d, MOD A4C: 59.5 ml LV vol s, MOD A4C: 29.3 ml LV SV MOD A4C:     59.5 ml RIGHT VENTRICLE             IVC RV S prime:     12.90 cm/s  IVC diam: 1.50 cm TAPSE (M-mode): 1.7 cm LEFT ATRIUM             Index       RIGHT ATRIUM           Index LA diam:        3.70 cm 2.40 cm/m  RA Area:     11.30 cm LA Vol (A2C):   60.0 ml 38.90 ml/m RA Volume:   25.70 ml  16.66 ml/m LA Vol (A4C):   77.2 ml 50.05 ml/m LA Biplane Vol: 75.7 ml 49.07 ml/m  AORTIC VALVE LVOT Vmax:   143.00 cm/s LVOT Vmean:  101.000 cm/s LVOT VTI:    0.306 m  AORTA Ao Root diam: 2.90 cm Ao Asc diam:  2.80 cm MITRAL VALVE                TRICUSPID VALVE MV Area (PHT): 3.37 cm     TR Peak grad:   30.0 mmHg MV Decel Time: 225 msec     TR Vmax:        274.00 cm/s MV E velocity: 81.00 cm/s MV A velocity: 128.00 cm/s  SHUNTS MV E/A ratio:  0.63         Systemic VTI:  0.31 m                             Systemic Diam: 1.70 cm Rudean Haskell MD Electronically signed by Rudean Haskell MD Signature Date/Time: 02/01/2021/10:17:16 AM    Final       Subjective: Eager to go home  Discharge Exam: Vitals:   02/12/21 0800 02/12/21 0921  BP: (!) 170/76 133/81  Pulse: 80 81  Resp:    Temp:    SpO2: 97% 97%   Vitals:   02/12/21 0636 02/12/21 0700 02/12/21 0800 02/12/21 0921  BP:  (!) 155/77 (!) 170/76 133/81  Pulse: 84 85 80 81  Resp:  19    Temp:  TempSrc:      SpO2: 98% 97% 97% 97%  Weight:      Height:        General: Pt is alert, awake, not in acute distress Cardiovascular: RRR, S1/S2 +, no rubs, no gallops Respiratory: CTA bilaterally, no wheezing, no rhonchi Abdominal: Soft, NT, ND, bowel sounds + Extremities: no edema, no cyanosis   The results of significant diagnostics from this hospitalization (including imaging, microbiology, ancillary and laboratory) are listed below for reference.     Microbiology: Recent Results (from the past 240 hour(s))  SARS CORONAVIRUS 2 (TAT 6-24 HRS) Nasopharyngeal Nasopharyngeal Swab     Status: None   Collection Time: 02/11/21 11:49 PM   Specimen: Nasopharyngeal Swab  Result Value Ref Range Status   SARS Coronavirus 2 NEGATIVE NEGATIVE Final    Comment: (NOTE) SARS-CoV-2 target nucleic acids are NOT DETECTED.  The SARS-CoV-2 RNA is generally detectable in upper and lower respiratory specimens during the acute phase of infection. Negative results do not preclude SARS-CoV-2 infection, do not rule out co-infections with other pathogens, and should not be used as the sole basis for treatment or other patient management decisions. Negative results must be combined with clinical observations, patient history, and epidemiological information. The expected result is Negative.  Fact Sheet for Patients: SugarRoll.be  Fact Sheet for Healthcare Providers: https://www.woods-mathews.com/  This test is not yet approved or cleared by the [place] FDA and  has been authorized for detection and/or diagnosis of SARS-CoV-2 by FDA  under an Emergency Use Authorization (EUA). This EUA will remain  in effect (meaning this test can be used) for the duration of the COVID-19 declaration under Se ction 564(b)(1) of the Act, 21 U.S.C. section 360bbb-3(b)(1), unless the authorization is terminated or revoked sooner.  Performed at [facility], [place] [address], [place], [place] [address]      Labs: BNP (last 3 results) Recent Labs    01/08/21 1630  BNP 093.2*   Basic Metabolic Panel: Recent Labs  Lab 02/11/21 2036 02/11/21 2234 02/12/21 0236 02/12/21 0529  NA 135  --  138 141  K 3.3*  --  3.4* 3.3*  CL 103  --  105  --   CO2 24  --  26  --   GLUCOSE 125*  --  130*  --   BUN 8  --  7*  --   CREATININE 0.85  --  0.66  --   CALCIUM 8.8*  --  9.2  --   MG  --  1.5* 2.3  --    Liver Function Tests: No results for input(s): AST, ALT, ALKPHOS, BILITOT, PROT, ALBUMIN in the last 168 hours. No results for input(s): LIPASE, AMYLASE in the last 168 hours. No results for input(s): AMMONIA in the last 168 hours. CBC: Recent Labs  Lab 02/11/21 2036 02/12/21 0236 02/12/21 0529  WBC 12.7* 9.6  --   HGB 11.2* 10.3* 10.2*  HCT 34.3* 32.6* 30.0*  MCV 90.3 90.1  --   PLT 323 275  --    Cardiac Enzymes: No results for input(s): CKTOTAL, CKMB, CKMBINDEX, TROPONINI in the last 168 hours. BNP: Invalid input(s): POCBNP CBG: No results for input(s): GLUCAP in the last 168 hours. D-Dimer No results for input(s): DDIMER in the last 72 hours. Hgb A1c No results for input(s): HGBA1C in the last 72 hours. Lipid Profile No results for input(s): CHOL, HDL, LDLCALC, TRIG, CHOLHDL, LDLDIRECT in the last 72 hours. Thyroid function studies No results  for input(s): TSH, T4TOTAL, T3FREE, THYROIDAB in the last 72 hours.  Invalid input(s): FREET3 Anemia work up No results for input(s): VITAMINB12, FOLATE, FERRITIN, TIBC, IRON, RETICCTPCT in the last 72 hours. Urinalysis    Component Value Date/Time   COLORURINE AMBER  (A) 01/05/2021 2349   APPEARANCEUR HAZY (A) 01/05/2021 2349   LABSPEC 1.012 01/05/2021 2349   PHURINE 5.0 01/05/2021 2349   GLUCOSEU NEGATIVE 01/05/2021 2349   GLUCOSEU NEGATIVE 09/09/2015 1611   HGBUR MODERATE (A) 01/05/2021 2349   BILIRUBINUR NEGATIVE 01/05/2021 2349   BILIRUBINUR negative 01/13/2018 [place] 01/05/2021 2349   PROTEINUR 30 (A) 01/05/2021 2349   UROBILINOGEN 0.2 01/13/2018 1131   UROBILINOGEN 0.2 09/09/2015 1611   NITRITE NEGATIVE 01/05/2021 2349   LEUKOCYTESUR LARGE (A) 01/05/2021 2349   Sepsis Labs Invalid input(s): PROCALCITONIN,  WBC,  LACTICIDVEN Microbiology Recent Results (from the past 240 hour(s))  SARS CORONAVIRUS 2 (TAT 6-24 HRS) Nasopharyngeal Nasopharyngeal Swab     Status: None   Collection Time: 02/11/21 11:49 PM   Specimen: Nasopharyngeal Swab  Result Value Ref Range Status   SARS Coronavirus 2 NEGATIVE NEGATIVE Final    Comment: (NOTE) SARS-CoV-2 target nucleic acids are NOT DETECTED.  The SARS-CoV-2 RNA is generally detectable in upper and lower respiratory specimens during the acute phase of infection. Negative results do not preclude SARS-CoV-2 infection, do not rule out co-infections with other pathogens, and should not be used as the sole basis for treatment or other patient management decisions. Negative results must be combined with clinical observations, patient history, and epidemiological information. The expected result is Negative.  Fact Sheet for Patients: SugarRoll.be  Fact Sheet for Healthcare Providers: https://www.woods-mathews.com/  This test is not yet approved or cleared by the [place] FDA and  has been authorized for detection and/or diagnosis of SARS-CoV-2 by FDA under an Emergency Use Authorization (EUA). This EUA will remain  in effect (meaning this test can be used) for the duration of the COVID-19 declaration under Se ction 564(b)(1) of the Act, 21  U.S.C. section 360bbb-3(b)(1), unless the authorization is terminated or revoked sooner.  Performed at [facility], [place] [address], [place],  [address]    Time spent: 30 min  SIGNED:   Marylu Lund, MD  [facility] 02/12/2021, 9:29 AM  If 7PM-7AM, please contact night-coverage

## 2021-02-13 ENCOUNTER — Encounter: Payer: Self-pay | Admitting: Family Medicine

## 2021-02-13 ENCOUNTER — Other Ambulatory Visit: Payer: Self-pay | Admitting: Gastroenterology

## 2021-02-13 ENCOUNTER — Other Ambulatory Visit: Payer: Self-pay

## 2021-02-13 ENCOUNTER — Ambulatory Visit (INDEPENDENT_AMBULATORY_CARE_PROVIDER_SITE_OTHER): Payer: Medicare Other | Admitting: Family Medicine

## 2021-02-13 ENCOUNTER — Inpatient Hospital Stay: Payer: Medicare Other | Admitting: Family Medicine

## 2021-02-13 VITALS — BP 126/74 | HR 69 | Temp 97.7°F | Resp 16 | Wt 127.2 lb

## 2021-02-13 DIAGNOSIS — J4489 Other specified chronic obstructive pulmonary disease: Secondary | ICD-10-CM

## 2021-02-13 DIAGNOSIS — I25810 Atherosclerosis of coronary artery bypass graft(s) without angina pectoris: Secondary | ICD-10-CM | POA: Diagnosis not present

## 2021-02-13 DIAGNOSIS — R0602 Shortness of breath: Secondary | ICD-10-CM | POA: Diagnosis not present

## 2021-02-13 DIAGNOSIS — R0789 Other chest pain: Secondary | ICD-10-CM

## 2021-02-13 DIAGNOSIS — I214 Non-ST elevation (NSTEMI) myocardial infarction: Secondary | ICD-10-CM

## 2021-02-13 DIAGNOSIS — J449 Chronic obstructive pulmonary disease, unspecified: Secondary | ICD-10-CM | POA: Diagnosis not present

## 2021-02-13 DIAGNOSIS — I10 Essential (primary) hypertension: Secondary | ICD-10-CM

## 2021-02-14 NOTE — Progress Notes (Signed)
Subjective  CC:  Chief Complaint  Patient presents with  . Hospitalization Follow-up    NSTEMI end of Feb Was seen in ER last night, states her heart was "aching," took an aspirin and ache did not subside. Called EMS, all tests negative at ER. C/O of SOB - but claims she has not used her inhaler     HPI: Megan [name] is a 75 y.o. female who presents to the office today to address the problems listed above in the chief complaint.  Reviewed hospital records from admission. Admitted over night and discharged yesterday am for chest pain. Fortunately, work up was negative for cardiac related pain. Feeling better this am. No more chest pain but was SOB last night and this am. Reviewed recent events, NSTEMI, and stressful home situation with her husband who is very ill and need to move out of their home into an assisted living facility. Lots of stress. Some breakthrough GERD sxs on high dose PPI> and recent bout with prolonged diverticulitis. Fortunately diarrhea has resolved. Had low mag and K as well.    Assessment  1. Non-cardiac chest pain   2. Shortness of breath   3. COPD with chronic bronchitis ([place])   4. Primary hypertension   5. Coronary artery disease involving coronary bypass graft of native heart without angina pectoris   6. NSTEMI (non-ST elevated myocardial infarction) [facility])      Plan   Currently medical stable:  Counseling done. Suspect some sxs are stress related, GERD and COPD. Will add back inhaler and monitor stress. No other med changes today. Will recheck labs in 4 weeks.   Today's visit was 30 minutes long. Greater than 50% of this time was devoted to face to face counseling with the patient and coordination of care. We discussed her diagnosis, prognosis, treatment options and treatment plan is documented above.    Follow up: recheck  03/19/2021  No orders of the defined types were placed in this encounter.  No orders of the defined types were placed in this  encounter.     I reviewed the patients updated PMH, FH, and SocHx.    Patient Active Problem List   Diagnosis Date Noted  . COPD with chronic bronchitis ([place]) 09/28/2018    Priority: High  . Primary hypertension 07/10/2018    Priority: High  . IFG (impaired fasting glucose) 11/10/2017    Priority: High  . 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 Coronary artery disease     Priority: High  . Peripheral vascular disease with claudication ([place])     Priority: High  . History of abdominal aortic aneurysm repair 05/18/2011    Priority: High  . Hyperlipidemia LDL goal <70     Priority: High  . DJD (degenerative joint disease), lumbosacral 11/10/2017    Priority: Medium  . Fibromyalgia 11/10/2017    Priority: Medium  . GERD (gastroesophageal reflux disease) 11/10/2017    Priority: Medium  . Fatty liver 11/10/2017    Priority: Medium  . 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    Priority: Medium  . History of renal artery stenosis 05/18/2011    Priority: Medium  . Dyshidrotic eczema 11/10/2017    Priority: Low  . Osteopenia 11/10/2017    Priority: Low  . Does use hearing aid 11/10/2017    Priority: Low  . Chest pain 02/11/2021  . Hypomagnesemia 02/03/2021  . NSTEMI (non-ST elevated myocardial infarction) ([place]) 01/31/2021  . AKI (acute kidney injury) ([place]) 01/06/2021  .  Diarrhea 01/06/2021  . Hypokalemia 03/23/2012   Current Meds  Medication Sig  . Accu-Chek FastClix Lancets MISC Check blood sugar once daily PRN  . albuterol (PROVENTIL HFA;VENTOLIN HFA) 108 (90 Base) MCG/ACT inhaler Inhale 2 puffs into the lungs every 4 (four) hours as needed for wheezing or shortness of breath.  . ALPRAZolam (XANAX) 0.25 MG tablet Take 1 tablet (0.25 mg total) by mouth at bedtime as needed for sleep.  [name] aspirin EC 81 MG tablet Take 1 tablet (81 mg total) by mouth daily.  [name] atorvastatin (LIPITOR) 80 MG tablet TAKE 1 TABLET BY MOUTH EVERY DAY (Patient taking differently: Take 80 mg by  mouth daily.)  . augmented betamethasone dipropionate (DIPROLENE-AF) 0.05 % cream Apply 1 application topically daily as needed (rash). For hands  . B Complex-Biotin-FA (B-100 COMPLEX PO) Take 1 tablet by mouth at bedtime.   . Budeson-Glycopyrrol-Formoterol (BREZTRI AEROSPHERE) 160-9-4.8 MCG/ACT AERO Inhale 2 puffs into the lungs in the morning and at bedtime. (Patient taking differently: Inhale 1 puff into the lungs daily as needed (shortness of breath).)  . ciclopirox (LOPROX) 0.77 % cream Apply 1 application topically daily as needed (hands fungus).   . CORDRAN 4 MCG/SQCM TAPE Apply 1 each topically daily as needed (Rash).   . cycloSPORINE (RESTASIS) 0.05 % ophthalmic emulsion Place 1 drop into both eyes 2 (two) times daily.   [name] gabapentin (NEURONTIN) 300 MG capsule Take 1-2 capsules (300-600 mg total) by mouth 3 (three) times daily. (Patient taking differently: Take 300 mg by mouth 2 (two) times daily.)  . glucose blood (ACCU-CHEK SMARTVIEW) test strip CHECK BLOOD SUGAR ONCE DAILY AS NEEDED  . losartan-hydrochlorothiazide (HYZAAR) 100-25 MG tablet Take 0.5 tablets by mouth daily.  . metoprolol tartrate (LOPRESSOR) 25 MG tablet Take 1 tablet (25 mg total) by mouth 2 (two) times daily.  . Multiple Vitamins-Minerals (MULTIVITAMIN WITH MINERALS) tablet Take 1 tablet by mouth at bedtime.  . nitroGLYCERIN (NITROSTAT) 0.4 MG SL tablet Place 1 tablet (0.4 mg total) under the tongue every 5 (five) minutes as needed for chest pain.  [name] nystatin (MYCOSTATIN) 100000 UNIT/ML suspension Take 5 mLs (500,000 Units total) by mouth 4 (four) times daily.  . ondansetron (ZOFRAN) 8 MG tablet Take by mouth every 8 (eight) hours as needed for nausea or vomiting.  [name] oxyCODONE-acetaminophen (PERCOCET) 10-325 MG tablet Take 1 tablet by mouth every 6 (six) hours as needed for pain. (Patient taking differently: Take 1 tablet by mouth daily as needed for pain.)  . potassium chloride (KLOR-CON) 10 MEQ tablet Take 1 tablet (10  mEq total) by mouth 2 (two) times daily.  [name] saccharomyces boulardii (FLORASTOR) 250 MG capsule Take 1 capsule (250 mg total) by mouth 2 (two) times daily. Any probiotic available is okay  . simethicone (MYLICON) 80 MG chewable tablet Chew 1 tablet (80 mg total) by mouth 4 (four) times daily as needed for flatulence (bloating). also available OTC  . ticagrelor (BRILINTA) 90 MG TABS tablet Take 1 tablet (90 mg total) by mouth 2 (two) times daily.  [name] torsemide (DEMADEX) 20 MG tablet Take 0.5 tablets (10 mg total) by mouth daily as needed (fluid).  . tretinoin (RETIN-A) 0.1 % cream Apply 1 application topically every other day. In the evening  . VITAMIN D, ERGOCALCIFEROL, PO Take 1,000 Units by mouth every evening.   . [DISCONTINUED] omeprazole (PRILOSEC) 40 MG capsule TAKE 1 CAPSULE BY MOUTH TWICE A DAY--NEED APPT FOR FURTHER REFILLS (Patient taking differently: Take 40 mg by mouth in  the morning and at bedtime.)    Allergies: Patient is allergic to anticoagulant compound, bextra [valdecoxib], hydrocod polst-cpm polst er, lipitor [atorvastatin], mevacor [lovastatin], plavix [clopidogrel bisulfate], zocor [simvastatin], doxycycline, metoprolol, morphine and related, codeine, and lisinopril. Family History: Patient family history includes AAA (abdominal aortic aneurysm) in her brother; Breast cancer in her sister; Colon cancer in her cousin and paternal grandmother; Diabetes in her brother and sister; Heart attack in her brother, father, mother, and sister; Heart disease in her brother, brother, father, mother, and sister; Hyperlipidemia in her brother and sister; Hypertension in her brother and sister; Liver cancer in her sister. Social History:  Patient  reports that she quit smoking about 25 years ago. Her smoking use included cigarettes. She smoked 0.00 packs per day for 37.00 years. She has never used smokeless tobacco. She reports current alcohol use of about 6.0 - 10.0 standard drinks of alcohol per  week. She reports that she does not use drugs.  Review of Systems: Constitutional: Negative for fever malaise or anorexia Cardiovascular: negative for chest pain Respiratory: negative for SOB or persistent cough Gastrointestinal: negative for abdominal pain  Objective  Vitals: BP 126/74   Pulse 69   Temp 97.7 F (36.5 C) (Temporal)   Resp 16   Wt 127 lb 3.2 oz (57.7 kg)   SpO2 97%   BMI 24.84 kg/m  General: no acute distress , A&Ox3, no respiratory distress HEENT: PEERL, conjunctiva normal, neck is supple Cardiovascular:  RRR without murmur or gallop.  Respiratory:  Good breath sounds bilaterally, CTAB with normal respiratory effort, no wheezing or rales Skin:  Warm, no rashes     Commons side effects, risks, benefits, and alternatives for medications and treatment plan prescribed today were discussed, and the patient expressed understanding of the given instructions. Patient is instructed to call or message via MyChart if he/she has any questions or concerns regarding our treatment plan. No barriers to understanding were identified. We discussed Red Flag symptoms and signs in detail. Patient expressed understanding regarding what to do in case of urgent or emergency type symptoms.   Medication list was reconciled, printed and provided to the patient in AVS. Patient instructions and summary information was reviewed with the patient as documented in the AVS. This note was prepared with assistance of Dragon voice recognition software. Occasional wrong-word or sound-a-like substitutions may have occurred due to the inherent limitations of voice recognition software  This visit occurred during the SARS-CoV-2 public health emergency.  Safety protocols were in place, including screening questions prior to the visit, additional usage of staff PPE, and extensive cleaning of exam room while observing appropriate contact time as indicated for disinfecting solutions.

## 2021-02-14 NOTE — Progress Notes (Unsigned)
Cardiology Office Note   Date:  02/18/2021   ID:  Megan [name], DOB 1946-10-31, MRN [id number]  PCP:  [name], MD  Cardiologist: [name] MD  No chief complaint on file.     History of Present Illness: Megan [name] is a 75 y.o. female who is seen for follow up CAD.  She has a history  of CAD, HTN, HL, and PAD.   She is status post coronary artery bypass graft surgery in 1995. She had a renal artery stent for renal artery stenosis in 2008. She had a stent graft of an abdominal aortic aneurysm 2009. She is s/p stenting of the left iliac. She is followed by [name] for PAD. Last CT in May 2018. She had [place] in June 2019 showing no endoleak.    She was hospitalized in July 2014 and had cardiac catheterization which showed that her grafts were open. EF normal.  She was hospitalized 11/15/2014 with which he describes as an aching sensation in her chest.  She underwent a treadmill Myoview on 11/17/14 which showed no ischemia and her ejection fraction was 86%.   In June 2019 she had Abdominal US at [facility] showing AAA of 4 cm.   In October 2019 she underwent right rotator cuff repair. No complications.   She was  having progressive issues with her low back that limited her activity and caused pain. She has exhausted conservative measures and underwent lumbar fusion in July by Dr Ronnald Ramp.   She was in the hospital  1/31 and discharged on 2/8- for AKI, diarrhea- diverticulitis (sigmoid colon), metabolic encephalopathy, UTI. Echo at that time was OK with normal EF and moderate TR.   She was re- admitted 2/26-02/03/21 with NSTEMI. Troponin up to 1085. Ecg showed significant inferolateral TWA. She underwent cardiac cath demonstrating patent LIMA to the LAD. The native RCA was occluded and she had severe sequential lesions in the SVG to RCA. This was treated with overlapping DES x 2. Procedure was complicated by a left forearm hematoma. She was treated with DAPT with ASA and  Brilinta. On metoprolol and high dose statin.   She was admitted for overnight observation on 02/11/21 after presenting with a localized ache in her left chest. This was different than when she presented with her NSTEMI. Ecg showed no new changes and troponin was negative x 3  On follow up today she has no chest pain. She has persistent SOB - more so than before her procedure. She notes she was supposed to have a myelogram before all this started but this will have to be put on hold.      Past Medical History:  Diagnosis Date  . AAA (abdominal aortic aneurysm) ([place])    a. s/p stent grafting in 2009.  . Adenomatous colon polyp 05/2001  . Adenomatous duodenal polyp   . Allergy   . Anginal pain ([place])   . COPD (chronic obstructive pulmonary disease) ([place])    pt denies COPD  . Coronary artery disease    a. s/p CABG in 1995;  b. 05/2013 Neg MV, EF 82%;  c. 06/2013 Cath: LM nl, LAD 40-50p, LCX nl, RCA 80p/140m, VG->RCA->PDA->PLSA min irregs, LIMA->LAD atretic, vigorous LV fxn.  . Diabetes mellitus without complication (HCC)    history of, resolved. 2017  . Diverticulosis   . Eczema, dyshidrotic   . Eczema, dyshidrotic 1986   Dr. Mathis Fare  . Emphysema of lung ([place])   . Fatty liver   .  Fibromyalgia   . GERD (gastroesophageal reflux disease)   . Hyperlipidemia   . Hypertension   . Migraine   . Myocardial infarction ([place])   . Osteoarthritis   . Osteoporosis    unsure, having a bone scan soon  . Peripheral arterial disease (HCC)    left leg diagnosed by Dr Mare Ferrari  . Renal artery stenosis ([place])    a. 2008 s/p PTA    Past Surgical History:  Procedure Laterality Date  . ABDOMINAL AORTAGRAM N/A 03/06/2014   Procedure: ABDOMINAL AORTAGRAM;  Surgeon: Rosetta Posner, MD;  Location: [facility] CATH LAB;  Service: Cardiovascular;  Laterality: N/A;  . ABDOMINAL AORTIC ANEURYSM REPAIR  2009   stenting  . ABDOMINAL HYSTERECTOMY     Total, 1992  . BREAST BIOPSY    . CARDIAC CATHETERIZATION    .  CARDIOVASCULAR STRESS TEST  11/11/2009   normal study  . CARPAL TUNNEL RELEASE  08/1993   left wrist  . CARPAL TUNNEL RELEASE  01/1997   right  . CATARACT EXTRACTION, BILATERAL  2021  . CORONARY ARTERY BYPASS GRAFT  07/1994   Triple by Dr Ceasar Mons  . CORONARY STENT INTERVENTION N/A 02/02/2021   Procedure: CORONARY STENT INTERVENTION;  Surgeon: Nelva Bush, MD;  Location: [facility] CV LAB;  Service: Cardiovascular;  Laterality: N/A;  . dental implants    . EYE SURGERY  03/2020   cataract  . HAND SURGERY Right 09/2002   Right hand pulley release  . LEFT HEART CATH AND CORS/GRAFTS ANGIOGRAPHY N/A 02/02/2021   Procedure: LEFT HEART CATH AND CORS/GRAFTS ANGIOGRAPHY;  Surgeon: Nelva Bush, MD;  Location: [facility] CV LAB;  Service: Cardiovascular;  Laterality: N/A;  . LEFT HEART CATHETERIZATION WITH CORONARY/GRAFT ANGIOGRAM N/A 06/11/2013   Procedure: LEFT HEART CATHETERIZATION WITH [name];  Surgeon: Thayer Headings, MD;  Location: [facility] CATH LAB;  Service: Cardiovascular;  Laterality: N/A;  . MULTIPLE TOOTH EXTRACTIONS  2000   All upper teeth.  Has full dneture.   [name] RENAL ARTERY STENT  2008  . SHOULDER ARTHROSCOPY WITH ROTATOR CUFF REPAIR Right 09/27/2018   Procedure: Right shoulder mini open rotator cuff repair;  Surgeon: Susa Day, MD;  Location: [facility] ORS;  Service: Orthopedics;  Laterality: Right;  90 mins  . thyroid cyst apiration  05/1997 and 07/1998  . TONSILECTOMY, ADENOIDECTOMY, BILATERAL MYRINGOTOMY AND TUBES  1989  . TONSILLECTOMY    . TOTAL ABDOMINAL HYSTERECTOMY  1992     Current Outpatient Medications  Medication Sig Dispense Refill  . Accu-Chek FastClix Lancets MISC Check blood sugar once daily PRN 100 each 1  . albuterol (PROVENTIL HFA;VENTOLIN HFA) 108 (90 Base) MCG/ACT inhaler Inhale 2 puffs into the lungs every 4 (four) hours as needed for wheezing or shortness of breath. 1 Inhaler 2  . ALPRAZolam (XANAX) 0.25 MG tablet Take 1 tablet (0.25 mg  total) by mouth at bedtime as needed for sleep. 90 tablet 3  . aspirin EC 81 MG tablet Take 1 tablet (81 mg total) by mouth daily. 90 tablet 3  . atorvastatin (LIPITOR) 80 MG tablet TAKE 1 TABLET BY MOUTH EVERY DAY (Patient taking differently: Take 80 mg by mouth daily.) 90 tablet 3  . augmented betamethasone dipropionate (DIPROLENE-AF) 0.05 % cream Apply 1 application topically daily as needed (rash). For hands  1  . B Complex-Biotin-FA (B-100 COMPLEX PO) Take 1 tablet by mouth at bedtime.     . Budeson-Glycopyrrol-Formoterol (BREZTRI AEROSPHERE) 160-9-4.8 MCG/ACT AERO Inhale 2 puffs into the lungs  in the morning and at bedtime. (Patient taking differently: Inhale 1 puff into the lungs daily as needed (shortness of breath).) 10.7 g 5  . ciclopirox (LOPROX) 0.77 % cream Apply 1 application topically daily as needed (hands fungus).     . clopidogrel (PLAVIX) 75 MG tablet Take 1 tablet (75 mg total) by mouth daily. 90 tablet 3  . CORDRAN 4 MCG/SQCM TAPE Apply 1 each topically daily as needed (Rash).     . cycloSPORINE (RESTASIS) 0.05 % ophthalmic emulsion Place 1 drop into both eyes 2 (two) times daily.     [name] dexlansoprazole (DEXILANT) 60 MG capsule Take 1 capsule (60 mg total) by mouth daily. 30 capsule 11  . gabapentin (NEURONTIN) 300 MG capsule Take 1-2 capsules (300-600 mg total) by mouth 3 (three) times daily. (Patient taking differently: Take 300 mg by mouth 2 (two) times daily.) 540 capsule 4  . glucose blood (ACCU-CHEK SMARTVIEW) test strip CHECK BLOOD SUGAR ONCE DAILY AS NEEDED 100 strip 1  . losartan-hydrochlorothiazide (HYZAAR) 100-25 MG tablet Take 0.5 tablets by mouth daily. 90 tablet 0  . Multiple Vitamins-Minerals (MULTIVITAMIN WITH MINERALS) tablet Take 1 tablet by mouth at bedtime.    . nitroGLYCERIN (NITROSTAT) 0.4 MG SL tablet Place 1 tablet (0.4 mg total) under the tongue every 5 (five) minutes as needed for chest pain. 25 tablet 11  . nystatin (MYCOSTATIN) 100000 UNIT/ML suspension  Take 5 mLs (500,000 Units total) by mouth 4 (four) times daily. 80 mL 0  . ondansetron (ZOFRAN) 8 MG tablet Take by mouth every 8 (eight) hours as needed for nausea or vomiting.    [name] oxyCODONE-acetaminophen (PERCOCET) 10-325 MG tablet Take 1 tablet by mouth every 6 (six) hours as needed for pain. (Patient taking differently: Take 1 tablet by mouth daily as needed for pain.) 20 tablet 0  . potassium chloride (KLOR-CON) 10 MEQ tablet Take 1 tablet (10 mEq total) by mouth 2 (two) times daily.    [name] saccharomyces boulardii (FLORASTOR) 250 MG capsule Take 1 capsule (250 mg total) by mouth 2 (two) times daily. Any probiotic available is okay 60 capsule 0  . simethicone (MYLICON) 80 MG chewable tablet Chew 1 tablet (80 mg total) by mouth 4 (four) times daily as needed for flatulence (bloating). also available OTC 30 tablet 0  . torsemide (DEMADEX) 20 MG tablet Take 0.5 tablets (10 mg total) by mouth daily as needed (fluid).    . tretinoin (RETIN-A) 0.1 % cream Apply 1 application topically every other day. In the evening    . VITAMIN D, ERGOCALCIFEROL, PO Take 1,000 Units by mouth every evening.     . metoprolol tartrate (LOPRESSOR) 25 MG tablet Take 1 tablet (25 mg total) by mouth 2 (two) times daily. 120 tablet 3   No current facility-administered medications for this visit.    Allergies:   Anticoagulant compound, Bextra [valdecoxib], Hydrocod polst-cpm polst er, Lipitor [atorvastatin], Mevacor [lovastatin], Plavix [clopidogrel bisulfate], Zocor [simvastatin], Doxycycline, Metoprolol, Morphine and related, Codeine, and Lisinopril    Social History:  The patient  reports that she quit smoking about 25 years ago. Her smoking use included cigarettes. She smoked 0.00 packs per day for 37.00 years. She has never used smokeless tobacco. She reports current alcohol use of about 6.0 - 10.0 standard drinks of alcohol per week. She reports that she does not use drugs.   Family History:  The patient's family  history includes AAA (abdominal aortic aneurysm) in her brother; Breast cancer in her sister; Colon  cancer in her cousin and paternal grandmother; Diabetes in her brother and sister; Heart attack in her brother, father, mother, and sister; Heart disease in her brother, brother, father, mother, and sister; Hyperlipidemia in her brother and sister; Hypertension in her brother and sister; Liver cancer in her sister.    ROS:  Please see the history of present illness.   Otherwise, review of systems are positive for none.   All other systems are reviewed and negative.    PHYSICAL EXAM: VS:  BP 126/61   Pulse 85   Ht 5\' 1"  (1.549 m)   Wt 125 lb 3.2 oz (56.8 kg)   SpO2 95%   BMI 23.66 kg/m  , BMI Body mass index is 23.66 kg/m. GENERAL:  Overweight  WF in NAD HEENT:  PERRL, EOMI, sclera are clear. Oropharynx is clear. NECK:  No jugular venous distention, carotid upstroke brisk and symmetric, no bruits, no thyromegaly or adenopathy LUNGS:  Clear to auscultation bilaterally CHEST:  Unremarkable HEART:  RRR,  PMI not displaced or sustained,S1 and S2 within normal limits, no S3, no S4: no clicks, no rubs, no murmurs ABD:  Soft, nontender. BS +, no masses or bruits. No hepatomegaly, no splenomegaly EXT:  1 + pulses throughout, no edema, no cyanosis no clubbing SKIN:  Warm and dry.  No rashes NEURO:  Alert and oriented x 3. Cranial nerves II through XII intact. PSYCH:  Cognitively intact     EKG:  EKG is not done today.     Recent Labs: 06/13/2020: TSH 1.81 01/08/2021: B Natriuretic Peptide 862.1 01/31/2021: ALT 15 02/12/2021: BUN 7; Creatinine, Ser 0.66; Hemoglobin 10.2; Magnesium 2.3; Platelets 275; Potassium 3.3; Sodium 141   Labs from primary care 12/15/16: A1c 5.6%. : Cholesterol 167 Trig-209, HDL 52, LDL 73.  AST 51, ALT 65. Normal renal indices. TSH is normal. Dated 06/24/17: CRP <0.3. Glucose 120, AST 58, ALT 80, other chemistries normal.  Dated 09/28/18: normal CBC, CMET  Lipid Panel     Component Value Date/Time   CHOL 160 06/13/2020 1017   CHOL 154 01/01/2020 0835   TRIG 294.0 (H) 06/13/2020 1017   HDL 51.10 06/13/2020 1017   HDL 50 01/01/2020 0835   CHOLHDL 3 06/13/2020 1017   VLDL 58.8 (H) 06/13/2020 1017   LDLCALC 64 01/01/2020 0835   LDLDIRECT 82.0 06/13/2020 1017      Wt Readings from Last 3 Encounters:  02/18/21 125 lb 3.2 oz (56.8 kg)  02/13/21 127 lb 3.2 oz (57.7 kg)  02/11/21 124 lb (56.2 kg)    Cardiac cath / PCI 02/02/21: CORONARY STENT INTERVENTION  LEFT HEART CATH AND CORS/GRAFTS ANGIOGRAPHY    Conclusion  Conclusions: 1. Severe single-vessel coronary artery disease with chronic total occlusion of mid RCA. 2. Mild to moderate, diffuse LAD disease with competitive flow in the distal vessel from LIMA-LAD. 3. Small but patent LIMA-LAD. 4. Patent SVG-rPDA-rPL with diffuse ostial/proximal disease of up to 60% and tandem hazy 95% and 90% mid graft lesions. 5. Successful PCI to ostial/proximal and mid SVG-rPDA-rPL using non-overlapping Resolute Onyx 3.0 x 26 mm and 3.5 x 34 mm drug-eluting stents with 0% residual stenosis and TIMI-3 flow. 6. Left forearm hematoma following sheath removal ant TR band placement, controlled with manual compression and placement of second TR band proximal to the first band.  Recommendations: 1. Dual antiplatelet therapy with aspirin and ticagrelor for at least 12 months. 2. Aggressive secondary prevention. 3. Close monitoring of left forearm hematoma.  Nelva Bush, MD [facility]  [facility]  Coronary Diagrams   Diagnostic Dominance: Right    Intervention     Implants      Echo 01/31/21: IMPRESSIONS    1. Left ventricular ejection fraction, by estimation, is 60 to 65%. The  left ventricle has normal function. The left ventricle has no regional  wall motion abnormalities. Left ventricular diastolic parameters are  consistent with Grade I diastolic  dysfunction (impaired relaxation).  2. Right  ventricular systolic function is normal. The right ventricular  size is normal. There is normal pulmonary artery systolic pressure.  3. Left atrial size was severely dilated.  4. The mitral valve is grossly normal. Mild mitral valve regurgitation:  Apperance of two jets; study may underestimated regurgitation severity.  5. The aortic valve is tricuspid. Aortic valve regurgitation is not  visualized. No aortic stenosis is present.  6. The inferior vena cava is normal in size with greater than 50%  respiratory variability, suggesting right atrial pressure of 3 mmHg.   Comparison(s): A prior study was performed on 01/09/21. No significant  change from prior study. Prior images reviewed side by side. Similar to  prior.     ASSESSMENT AND PLAN:  1.CAD status post CABG in 1995. Recent NSTEMI. Cardiac cath demonstrated patent LIMA to the LAD. Occluded native RCA. SVG to PDA/PL had severe sequential stenoses treated with DES x 2. Normal EF. Had atypical chest pain the following week and troponins were normal. Now on DAPT. I suspect her dyspnea now is related to Brilinta. Will switch to Plavix. She reports some itching and hand swelling in the past on Plavix. If this recurs the only other option would be Effient. If so she would need a lower dose due to age and female sex. Will also change prilosec to Dexilant due to interaction with Plavix  2. HTN with hypertensive heart disease. BP is under  good control. Now on metoprolol.  3. Hypercholesterolemia- LDL is at goal but triglycerides persistently elevated.   4.  Status post stent to right renal artery 2008. Patent by CT in October  5. Status post stent graft to abdominal aortic aneurysm 2009.  S/p iliac stent in 2016. Stable by CT in October  6. Peripheral artery disease followed by Dr. Donzetta Matters  7. Lumbar spine disease. S/p fusion with persistent pain. Unable to have a myelogram preferably for 6 months due to need for DAPT  Follow up in 2  months.   Signed, [name] MD, [facility]    02/18/2021 4:10 PM    [facility]

## 2021-02-17 ENCOUNTER — Inpatient Hospital Stay: Payer: Medicare Other | Admitting: Family Medicine

## 2021-02-18 ENCOUNTER — Ambulatory Visit: Payer: Medicare Other | Admitting: Cardiology

## 2021-02-18 ENCOUNTER — Encounter: Payer: Self-pay | Admitting: Cardiology

## 2021-02-18 ENCOUNTER — Other Ambulatory Visit: Payer: Self-pay

## 2021-02-18 VITALS — BP 126/61 | HR 85 | Ht 61.0 in | Wt 125.2 lb

## 2021-02-18 DIAGNOSIS — I25708 Atherosclerosis of coronary artery bypass graft(s), unspecified, with other forms of angina pectoris: Secondary | ICD-10-CM | POA: Diagnosis not present

## 2021-02-18 DIAGNOSIS — I119 Hypertensive heart disease without heart failure: Secondary | ICD-10-CM

## 2021-02-18 DIAGNOSIS — E782 Mixed hyperlipidemia: Secondary | ICD-10-CM

## 2021-02-18 MED ORDER — DEXLANSOPRAZOLE 60 MG PO CPDR: 60.0000 mg | DELAYED_RELEASE_CAPSULE | Freq: Every day | ORAL | 11 refills | Status: DC

## 2021-02-18 MED ORDER — METOPROLOL TARTRATE 25 MG PO TABS: 25.0000 mg | ORAL_TABLET | Freq: Two times a day (BID) | ORAL | 3 refills | Status: DC

## 2021-02-18 MED ORDER — CLOPIDOGREL BISULFATE 75 MG PO TABS: 75.0000 mg | ORAL_TABLET | Freq: Every day | ORAL | 3 refills | Status: DC

## 2021-02-18 NOTE — Patient Instructions (Signed)
Switch Brilinta to Plavix - start with 300 mg the first day then 75 mg daily  Switch prilosec to Dexilant 60 mg daily  Follow up in 2 months

## 2021-02-23 ENCOUNTER — Inpatient Hospital Stay: Payer: Medicare Other | Admitting: Family Medicine

## 2021-02-25 ENCOUNTER — Ambulatory Visit: Payer: Medicare Other | Admitting: Gastroenterology

## 2021-02-25 ENCOUNTER — Encounter: Payer: Self-pay | Admitting: Gastroenterology

## 2021-02-25 VITALS — BP 132/60 | HR 64 | Ht 59.5 in | Wt 126.2 lb

## 2021-02-25 DIAGNOSIS — R197 Diarrhea, unspecified: Secondary | ICD-10-CM | POA: Diagnosis not present

## 2021-02-25 DIAGNOSIS — Z86010 Personal history of colonic polyps: Secondary | ICD-10-CM

## 2021-02-25 DIAGNOSIS — K5732 Diverticulitis of large intestine without perforation or abscess without bleeding: Secondary | ICD-10-CM

## 2021-02-25 DIAGNOSIS — Z860101 Personal history of adenomatous and serrated colon polyps: Secondary | ICD-10-CM

## 2021-02-25 NOTE — Progress Notes (Signed)
    History of Present Illness: This is a 75 year old female hospitalized in early Feb with diarrhea and diverticulitis. She had AKI with Cr=3.29 and hyponatremia with Na=130 on admission. She was treated with Cipro and Flagyl and all symptoms resolved. Her diarrhea lasted about 4 weeks however her bowel function has returned to normal. Renal function and Na returned to normal.   Colonoscopy 10/2019: - Two 5 to 6 mm polyps in the rectum and in the sigmoid colon, removed with a cold snare. Resected and retrieved. Hyperplastic.  - Severe diverticulosis in the left colon. - Moderate diverticulosis in the right colon. - Small ascending colon AVM.  Current Medications, Allergies, Past Medical History, Past Surgical History, Family History and Social History were reviewed in [employer] record.   Physical Exam: General: Well developed, well nourished, no acute distress Head: Normocephalic and atraumatic Eyes: Sclerae anicteric, EOMI Ears: Normal auditory acuity Mouth: Not examined, mask on during Covid-19 pandemic Lungs: Clear throughout to auscultation Heart: Regular rate and rhythm; no murmurs, rubs or bruits Abdomen: Soft, non tender and non distended. No masses, hepatosplenomegaly or hernias noted. Normal Bowel sounds Rectal:  Not done Musculoskeletal: Symmetrical with no gross deformities  Pulses:  Normal pulses noted Extremities: No clubbing, cyanosis, edema or deformities noted Neurological: Alert oriented x 4, grossly nonfocal Psychological:  Alert and cooperative. Normal mood and affect   Assessment and Recommendations:  1. Acute diverticulitis with diarrhea, resolved. Severe left colon diverticulosis and moderate right colon diverticulosis. Long term high fiber diet with good daily hydration. May discontinue Florastor however it diarrhea or other GI symptoms return can resume. GI follow up prn.   2. AKI, resolved. Likely secondary to dehydration from  diarrhea. Long term adequate daily hydration. Consider discontinuing HCTZ, defer to PCP.   3. Mild normocytic anemia. Colonoscopy and EGD performed in 10/2019. Follow up with PCP.   4. Personal history of adenomatous colon polyps. Family history of colon cancer. No plans for future surveillance colonoscopies due to age and absence of precancerous polyps on her 10/2019 colonoscopy.

## 2021-02-25 NOTE — Patient Instructions (Addendum)
Please increase your caffeine free fluid intake.   Talk to your primary care physcian about stopping Hyzaar.   Temporarily stop taking Dexilant to see if your headaches improve. If they do then please contact our office to be switched to another medication.   Thank you for choosing me and [facility].  Pricilla Riffle. Dagoberto Ligas., MD., [facility]

## 2021-02-26 ENCOUNTER — Telehealth: Payer: Self-pay

## 2021-02-26 NOTE — Telephone Encounter (Signed)
Megan [name] is calling in from [facility] at [facility], she is wondering if Dr.Andy has received the paperwork for her to live with her husband there.

## 2021-03-02 ENCOUNTER — Telehealth: Payer: Self-pay

## 2021-03-02 NOTE — Telephone Encounter (Signed)
Pt called asking if Dr. Ellwood Handler CMA and Dr. Toy Baker CMA would give her a call regarding her and her husbands forms for an assisted living. Pt states the assisted living, [facility], sent forms back to have diagnosis and level of care filled out. Please advise.

## 2021-03-02 NOTE — Telephone Encounter (Signed)
I have placed form in your sign folder

## 2021-03-03 DIAGNOSIS — M48061 Spinal stenosis, lumbar region without neurogenic claudication: Secondary | ICD-10-CM | POA: Diagnosis not present

## 2021-03-03 DIAGNOSIS — Z9889 Other specified postprocedural states: Secondary | ICD-10-CM | POA: Insufficient documentation

## 2021-03-03 DIAGNOSIS — I10 Essential (primary) hypertension: Secondary | ICD-10-CM | POA: Diagnosis not present

## 2021-03-03 DIAGNOSIS — M461 Sacroiliitis, not elsewhere classified: Secondary | ICD-10-CM | POA: Diagnosis not present

## 2021-03-05 DIAGNOSIS — M48061 Spinal stenosis, lumbar region without neurogenic claudication: Secondary | ICD-10-CM | POA: Diagnosis not present

## 2021-03-06 ENCOUNTER — Telehealth: Payer: Self-pay | Admitting: Cardiology

## 2021-03-06 DIAGNOSIS — R519 Headache, unspecified: Secondary | ICD-10-CM | POA: Diagnosis not present

## 2021-03-06 DIAGNOSIS — H903 Sensorineural hearing loss, bilateral: Secondary | ICD-10-CM | POA: Diagnosis not present

## 2021-03-06 MED ORDER — PRASUGREL HCL 5 MG PO TABS: 5.0000 mg | ORAL_TABLET | Freq: Every day | ORAL | 3 refills | Status: DC

## 2021-03-06 MED ORDER — OMEPRAZOLE 20 MG PO CPDR: 20.0000 mg | DELAYED_RELEASE_CAPSULE | Freq: Every day | ORAL | 3 refills | Status: DC

## 2021-03-06 NOTE — Telephone Encounter (Signed)
Ok we can switch Plavix to Effient 5 mg daily. See if itching resolves. Could also go back to prilosec since we are stopping Plavix  Jeziah Kretschmer Martinique MD, [facility]

## 2021-03-06 NOTE — Telephone Encounter (Signed)
Patient called in- stating she has itching but not sure which medication it could be coming from. Per Dr.Jordan's note on 03/15- 1. CAD status post CABG in 1995.  Recent NSTEMI. Cardiac cath demonstrated patent LIMA to the LAD. Occluded native RCA. SVG to PDA/PL had severe sequential stenoses treated with DES x 2. Normal EF. Had atypical chest pain the following week and troponins were normal. Now on DAPT. I suspect her dyspnea now is related to Brilinta. Will switch to Plavix. She reports some itching and hand swelling in the past on Plavix. If this recurs the only other option would be Effient. If so she would need a lower dose due to age and female sex. Will also change prilosec to Dexilant due to interaction with Plavix   LVM advising I would route to MD and call back with response- since previous message suggested to switch if itching reoccurred.  Thank you!

## 2021-03-06 NOTE — Telephone Encounter (Signed)
     Pt c/o medication issue:  1. Name of Medication:   clopidogrel (PLAVIX) 75 MG tablet    2. How are you currently taking this medication (dosage and times per day)?   3. Are you having a reaction (difficulty breathing--STAT)? Itching  4. What is your medication issue? Pt said her body is itching, she is not sure if its from the plavix or hydrocodone, she said she took her plavix this morning and her hands and feet and body are itching and she haven't taken her hydrocodone today

## 2021-03-06 NOTE — Telephone Encounter (Signed)
Already spoke to patient see 03/06/21 telephone note.

## 2021-03-06 NOTE — Telephone Encounter (Signed)
Spoke to patient Dr.Jordan's advice given.She will stop Dexilant and restart Prilosec 20 mg daily.She stated Dexilant is too expensive.Advised to call back if itching continues.

## 2021-03-08 ENCOUNTER — Other Ambulatory Visit: Payer: Self-pay | Admitting: Gastroenterology

## 2021-03-09 ENCOUNTER — Other Ambulatory Visit: Payer: Self-pay

## 2021-03-09 MED ORDER — HYDROXYZINE HCL 25 MG PO TABS: 25.0000 mg | ORAL_TABLET | Freq: Three times a day (TID) | ORAL | 0 refills | Status: DC | PRN

## 2021-03-09 MED ORDER — PRASUGREL HCL 10 MG PO TABS: ORAL_TABLET | ORAL | 3 refills | Status: DC

## 2021-03-09 NOTE — Telephone Encounter (Signed)
The only other option would be to go ahead and get the 10 mg tablet and break in half.   Sheryl Saintil Martinique MD, [facility]

## 2021-03-09 NOTE — Telephone Encounter (Signed)
We can prescribe atarax 25 mg tid prn for itching  Dereka Lueras Martinique MD, [facility]

## 2021-03-10 NOTE — Telephone Encounter (Signed)
Error

## 2021-03-12 DIAGNOSIS — H5310 Unspecified subjective visual disturbances: Secondary | ICD-10-CM | POA: Diagnosis not present

## 2021-03-12 DIAGNOSIS — H04121 Dry eye syndrome of right lacrimal gland: Secondary | ICD-10-CM | POA: Diagnosis not present

## 2021-03-13 ENCOUNTER — Ambulatory Visit (INDEPENDENT_AMBULATORY_CARE_PROVIDER_SITE_OTHER): Payer: Medicare Other | Admitting: Family Medicine

## 2021-03-13 ENCOUNTER — Other Ambulatory Visit: Payer: Self-pay

## 2021-03-13 ENCOUNTER — Encounter: Payer: Self-pay | Admitting: Family Medicine

## 2021-03-13 ENCOUNTER — Ambulatory Visit (INDEPENDENT_AMBULATORY_CARE_PROVIDER_SITE_OTHER): Payer: Medicare Other

## 2021-03-13 VITALS — BP 144/60 | HR 88 | Temp 97.8°F | Wt 122.2 lb

## 2021-03-13 DIAGNOSIS — M79641 Pain in right hand: Secondary | ICD-10-CM

## 2021-03-13 DIAGNOSIS — S6991XA Unspecified injury of right wrist, hand and finger(s), initial encounter: Secondary | ICD-10-CM | POA: Diagnosis not present

## 2021-03-13 DIAGNOSIS — W19XXXA Unspecified fall, initial encounter: Secondary | ICD-10-CM | POA: Diagnosis not present

## 2021-03-13 DIAGNOSIS — M25531 Pain in right wrist: Secondary | ICD-10-CM

## 2021-03-13 DIAGNOSIS — M7989 Other specified soft tissue disorders: Secondary | ICD-10-CM | POA: Diagnosis not present

## 2021-03-13 DIAGNOSIS — M810 Age-related osteoporosis without current pathological fracture: Secondary | ICD-10-CM | POA: Diagnosis not present

## 2021-03-13 NOTE — Progress Notes (Signed)
Subjective  CC:  Chief Complaint  Patient presents with  . Wrist Injury    [address] Wednesday in her new apartment, doesn't remember putting any weight on her wrist. Pain and swelling increased overnight     HPI: Megan [name] is a 75 y.o. female who presents to the office today to address the problems listed above in the chief complaint.  DOI 03/10/2021  75 year old was hanging a shower curtain and shoes grabbed on the floor causing patient to trip.  She fell backwards.  She is not exactly sure how she landed.  She was able to get herself up unassisted.  She had some hand pain and leg pain that evening, but the next day had significant swelling in the right hand.  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  Swelling worsened.  Had significant pain overnight requiring hydrocodone that was not very helpful.  This morning has significant hand and wrist swelling.  Hard to use the hand because of pain.  No elbow or shoulder injuries.  No other injuries noted.  She did not hit her head or lose consciousness.   Assessment  1. Fall, initial encounter   2. Right hand pain   3. Right wrist pain   4. Injury of right wrist, initial encounter      Plan   Fall with contusion/sprain of hand and wrist: X-rays are negative for fractures.  Placed in wrist splint and sling.  Recommend pain medicines as needed.  Icing and elevation.  Will recheck in office next week.  Patient aware of x-ray findings.  Follow up: As scheduled 03/19/2021  Orders Placed This Encounter  Procedures  . DG Hand Complete Right  . DG Wrist Complete Right   No orders of the defined types were placed in this encounter.     I reviewed the patients updated PMH, FH, and SocHx.    Patient Active Problem List   Diagnosis Date Noted  . COPD with chronic bronchitis ([place]) 09/28/2018    Priority: High  . Primary hypertension 07/10/2018     Priority: High  . IFG (impaired fasting glucose) 11/10/2017    Priority: High  . 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 Coronary artery disease     Priority: High  . Peripheral vascular disease with claudication ([place])     Priority: High  . History of abdominal aortic aneurysm repair 05/18/2011    Priority: High  . Hyperlipidemia LDL goal <70     Priority: High  . DJD (degenerative joint disease), lumbosacral 11/10/2017    Priority: Medium  . Fibromyalgia 11/10/2017    Priority: Medium  . GERD (gastroesophageal reflux disease) 11/10/2017    Priority: Medium  . Fatty liver 11/10/2017    Priority: Medium  . 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    Priority: Medium  . History of renal artery stenosis 05/18/2011    Priority: Medium  . Dyshidrotic eczema 11/10/2017    Priority: Low  . Osteopenia 11/10/2017    Priority: Low  . Does use hearing aid 11/10/2017    Priority: Low  . Chest pain 02/11/2021  . Hypomagnesemia 02/03/2021  . NSTEMI (non-ST elevated myocardial infarction) ([place]) 01/31/2021  . AKI (acute kidney injury) ([place]) 01/06/2021  . Diarrhea 01/06/2021  . Hypokalemia 03/23/2012   Current Meds  Medication Sig  . Accu-Chek FastClix Lancets MISC Check blood sugar once daily PRN  . albuterol (PROVENTIL  HFA;VENTOLIN HFA) 108 (90 Base) MCG/ACT inhaler Inhale 2 puffs into the lungs every 4 (four) hours as needed for wheezing or shortness of breath.  . ALPRAZolam (XANAX) 0.25 MG tablet Take 1 tablet (0.25 mg total) by mouth at bedtime as needed for sleep.  [name] aspirin EC 81 MG tablet Take 1 tablet (81 mg total) by mouth daily.  [name] atorvastatin (LIPITOR) 80 MG tablet TAKE 1 TABLET BY MOUTH EVERY DAY (Patient taking differently: Take 80 mg by mouth daily.)  . augmented betamethasone dipropionate (DIPROLENE-AF) 0.05 % cream Apply 1 application topically daily as needed (rash). For hands  . B Complex-Biotin-FA (B-100 COMPLEX PO) Take 1 tablet by mouth at bedtime.   .  Budeson-Glycopyrrol-Formoterol (BREZTRI AEROSPHERE) 160-9-4.8 MCG/ACT AERO Inhale 2 puffs into the lungs in the morning and at bedtime. (Patient taking differently: Inhale 1 puff into the lungs daily as needed (shortness of breath).)  . ciclopirox (LOPROX) 0.77 % cream Apply 1 application topically daily as needed (hands fungus).   . CORDRAN 4 MCG/SQCM TAPE Apply 1 each topically daily as needed (Rash).   . cycloSPORINE (RESTASIS) 0.05 % ophthalmic emulsion Place 1 drop into both eyes 2 (two) times daily.   [name] gabapentin (NEURONTIN) 300 MG capsule Take 1-2 capsules (300-600 mg total) by mouth 3 (three) times daily. (Patient taking differently: Take 300 mg by mouth 2 (two) times daily.)  . glucose blood (ACCU-CHEK SMARTVIEW) test strip CHECK BLOOD SUGAR ONCE DAILY AS NEEDED  . HYDROcodone-acetaminophen (NORCO) 10-325 MG tablet Take 1 tablet by mouth every 6 (six) hours as needed.  . hydrOXYzine (ATARAX/VISTARIL) 25 MG tablet Take 1 tablet (25 mg total) by mouth 3 (three) times daily as needed.  [name] losartan-hydrochlorothiazide (HYZAAR) 100-25 MG tablet Take 0.5 tablets by mouth daily.  . metoprolol tartrate (LOPRESSOR) 25 MG tablet Take 1 tablet (25 mg total) by mouth 2 (two) times daily.  . metoprolol tartrate (LOPRESSOR) 25 MG tablet TAKE 1 TABLET (25 MG TOTAL) BY MOUTH TWO TIMES DAILY.  . Multiple Vitamins-Minerals (MULTIVITAMIN WITH MINERALS) tablet Take 1 tablet by mouth at bedtime.  . nitroGLYCERIN (NITROSTAT) 0.4 MG SL tablet Place 1 tablet (0.4 mg total) under the tongue every 5 (five) minutes as needed for chest pain.  [name] nystatin (MYCOSTATIN) 100000 UNIT/ML suspension Take 5 mLs (500,000 Units total) by mouth 4 (four) times daily.  [name] omeprazole (PRILOSEC) 20 MG capsule Take 1 capsule (20 mg total) by mouth daily.  . ondansetron (ZOFRAN) 8 MG tablet Take by mouth every 8 (eight) hours as needed for nausea or vomiting.  . potassium chloride (KLOR-CON) 10 MEQ tablet Take 1 tablet (10 mEq total) by  mouth 2 (two) times daily.  . prasugrel (EFFIENT) 10 MG TABS tablet Take 1/2 tablet ( 5 Mg ) daily  . saccharomyces boulardii (FLORASTOR) 250 MG capsule Take 1 capsule (250 mg total) by mouth 2 (two) times daily. Any probiotic available is okay  . simethicone (MYLICON) 80 MG chewable tablet Chew 1 tablet (80 mg total) by mouth 4 (four) times daily as needed for flatulence (bloating). also available OTC  . ticagrelor (BRILINTA) 90 MG TABS tablet TAKE 1 TABLET (90 MG TOTAL) BY MOUTH TWO TIMES DAILY.  [name] torsemide (DEMADEX) 20 MG tablet Take 0.5 tablets (10 mg total) by mouth daily as needed (fluid).  . tretinoin (RETIN-A) 0.1 % cream Apply 1 application topically every other day. In the evening  . VITAMIN D, ERGOCALCIFEROL, PO Take 1,000 Units by mouth every evening.   . [  DISCONTINUED] oxyCODONE-acetaminophen (PERCOCET) 10-325 MG tablet Take 1 tablet by mouth every 6 (six) hours as needed for pain. (Patient taking differently: Take 1 tablet by mouth daily as needed for pain.)    Allergies: Patient is allergic to anticoagulant compound, bextra [valdecoxib], hydrocod polst-cpm polst er, lipitor [atorvastatin], mevacor [lovastatin], plavix [clopidogrel bisulfate], zocor [simvastatin], doxycycline, metoprolol, morphine and related, codeine, and lisinopril. Family History: Patient family history includes AAA (abdominal aortic aneurysm) in her brother; Breast cancer in her sister; Colon cancer in her cousin and paternal grandmother; Diabetes in her brother and sister; Heart attack in her brother, father, mother, and sister; Heart disease in her brother, brother, father, mother, and sister; Hyperlipidemia in her brother and sister; Hypertension in her brother and sister; Liver cancer in her sister. Social History:  Patient  reports that she quit smoking about 25 years ago. Her smoking use included cigarettes. She smoked 0.00 packs per day for 37.00 years. She has never used smokeless tobacco. She reports  current alcohol use of about 6.0 - 10.0 standard drinks of alcohol per week. She reports that she does not use drugs.  Review of Systems: Constitutional: Negative for fever malaise or anorexia Cardiovascular: negative for chest pain Respiratory: negative for SOB or persistent cough Gastrointestinal: negative for abdominal pain  Objective  Vitals: BP (!) 144/60   Pulse 88   Temp 97.8 F (36.6 C) (Temporal)   Wt 122 lb 3.2 oz (55.4 kg)   SpO2 97%   BMI 24.27 kg/m  General: no acute distress , A&Ox3 Right hand: Dorsal hand and wrist swelling, mild bruising, diffuse tenderness over distal radius and radial wrist area.  Normal alignment of fingers with flexion.  Normal pulses throughout.  Normal sensation.     Commons side effects, risks, benefits, and alternatives for medications and treatment plan prescribed today were discussed, and the patient expressed understanding of the given instructions. Patient is instructed to call or message via MyChart if he/she has any questions or concerns regarding our treatment plan. No barriers to understanding were identified. We discussed Red Flag symptoms and signs in detail. Patient expressed understanding regarding what to do in case of urgent or emergency type symptoms.   Medication list was reconciled, printed and provided to the patient in AVS. Patient instructions and summary information was reviewed with the patient as documented in the AVS. This note was prepared with assistance of Dragon voice recognition software. Occasional wrong-word or sound-a-like substitutions may have occurred due to the inherent limitations of voice recognition software  This visit occurred during the SARS-CoV-2 public health emergency.  Safety protocols were in place, including screening questions prior to the visit, additional usage of staff PPE, and extensive cleaning of exam room while observing appropriate contact time as indicated for disinfecting solutions.

## 2021-03-13 NOTE — Progress Notes (Signed)
Please call patient: I have reviewed his/her lab results. She is correct. It is not broken. Wear splint, ice the hand/wrist twice a day for the next few days, use pain meds as needed and wear sling.  Will recheck next week.

## 2021-03-16 ENCOUNTER — Encounter: Payer: Self-pay | Admitting: Family Medicine

## 2021-03-16 ENCOUNTER — Ambulatory Visit (INDEPENDENT_AMBULATORY_CARE_PROVIDER_SITE_OTHER): Payer: Medicare Other | Admitting: Family Medicine

## 2021-03-16 ENCOUNTER — Other Ambulatory Visit: Payer: Self-pay

## 2021-03-16 DIAGNOSIS — D500 Iron deficiency anemia secondary to blood loss (chronic): Secondary | ICD-10-CM | POA: Diagnosis not present

## 2021-03-16 DIAGNOSIS — E876 Hypokalemia: Secondary | ICD-10-CM | POA: Diagnosis not present

## 2021-03-16 DIAGNOSIS — M79642 Pain in left hand: Secondary | ICD-10-CM | POA: Diagnosis not present

## 2021-03-16 DIAGNOSIS — M461 Sacroiliitis, not elsewhere classified: Secondary | ICD-10-CM | POA: Diagnosis not present

## 2021-03-16 LAB — CBC WITH DIFFERENTIAL/PLATELET
Basophils Absolute: 0 10*3/uL (ref 0.0–0.1)
Basophils Relative: 0.4 % (ref 0.0–3.0)
Eosinophils Absolute: 0 10*3/uL (ref 0.0–0.7)
Eosinophils Relative: 0.4 % (ref 0.0–5.0)
HCT: 38.4 % (ref 36.0–46.0)
Hemoglobin: 12.6 g/dL (ref 12.0–15.0)
Lymphocytes Relative: 14.5 % (ref 12.0–46.0)
Lymphs Abs: 1.2 10*3/uL (ref 0.7–4.0)
MCHC: 32.9 g/dL (ref 30.0–36.0)
MCV: 87.9 fl (ref 78.0–100.0)
Monocytes Absolute: 0.5 10*3/uL (ref 0.1–1.0)
Monocytes Relative: 6.4 % (ref 3.0–12.0)
Neutro Abs: 6.7 10*3/uL (ref 1.4–7.7)
Neutrophils Relative %: 78.3 % — ABNORMAL HIGH (ref 43.0–77.0)
Platelets: 316 10*3/uL (ref 150.0–400.0)
RBC: 4.37 Mil/uL (ref 3.87–5.11)
RDW: 14.5 % (ref 11.5–15.5)
WBC: 8.6 10*3/uL (ref 4.0–10.5)

## 2021-03-16 LAB — COMPREHENSIVE METABOLIC PANEL
ALT: 12 U/L (ref 0–35)
AST: 18 U/L (ref 0–37)
Albumin: 3.6 g/dL (ref 3.5–5.2)
Alkaline Phosphatase: 72 U/L (ref 39–117)
BUN: 9 mg/dL (ref 6–23)
CO2: 29 mEq/L (ref 19–32)
Calcium: 9.6 mg/dL (ref 8.4–10.5)
Chloride: 101 mEq/L (ref 96–112)
Creatinine, Ser: 0.61 mg/dL (ref 0.40–1.20)
GFR: 86.76 mL/min (ref >60.00)
Glucose, Bld: 114 mg/dL — ABNORMAL HIGH (ref 70–99)
Potassium: 3.7 mEq/L (ref 3.5–5.1)
Sodium: 138 mEq/L (ref 135–145)
Total Bilirubin: 0.5 mg/dL (ref 0.2–1.2)
Total Protein: 6.2 g/dL (ref 6.0–8.3)

## 2021-03-16 LAB — MAGNESIUM: Magnesium: 1.6 mg/dL (ref 1.5–2.5)

## 2021-03-16 MED ORDER — DICLOFENAC SODIUM 75 MG PO TBEC: 75.0000 mg | DELAYED_RELEASE_TABLET | Freq: Two times a day (BID) | ORAL | 0 refills | Status: DC | PRN

## 2021-03-16 NOTE — Progress Notes (Signed)
Please call patient: I have reviewed his/her lab results. Lab resutls look better. Can stop iron if eating well. Rec otc magnesium supplement once daily. thanks

## 2021-03-16 NOTE — Progress Notes (Signed)
Subjective  CC:  Chief Complaint  Patient presents with  . Wrist Pain    Left wrist     HPI: Megan [name] is a 75 y.o. female who presents to the office today to address the problems listed above in the chief complaint.  Since visit 3 days ago, has noticed left hand and wrist pain.  Became sore.  Believes worse due to overuse from recent movement possibly from fall.  No redness, warmth or swelling.  Use hydrocodone last night with minimal relief.  Feeling a bit better today.  No weakness in the hand.  Has known osteoarthritis.  No history of gout.  Of note, right wrist has improved significantly.  Decreased swelling while wearing the brace.  Pain is down as well.  Follow-up from hospitalization for acute diverticulitis and then atypical chest pain: Reviewed last notes.  Has history of hyponatremia hypomagnesemia, hypokalemia and anemia.  All need follow-up lab work today.  She has been on iron therapy.  Eating and drinking normally.  No further diarrhea or chest pain.   Assessment  1. Left hand pain   2. Hypomagnesemia   3. Hypokalemia   4. Iron deficiency anemia due to chronic blood loss      Plan   Left hand pain: Overuse tendinitis and/or osteoarthritis.  Recommend Voltaren as needed.  Rest.  Ice.  Follow-up on magnesium, potassium levels and sodium levels.  Nutrition is back to normal.  Follow up: 6 months for complete physical   Orders Placed This Encounter  Procedures  . CBC with Differential/Platelet  . Comprehensive metabolic panel  . Magnesium  . Iron, TIBC and Ferritin Panel   Meds ordered this encounter  Medications  . diclofenac (VOLTAREN) 75 MG EC tablet    Sig: Take 1 tablet (75 mg total) by mouth 2 (two) times daily as needed.    Dispense:  30 tablet    Refill:  0      I reviewed the patients updated PMH, FH, and SocHx.    Patient Active Problem List   Diagnosis Date Noted  . COPD with chronic bronchitis ([place]) 09/28/2018    Priority: High   . Primary hypertension 07/10/2018    Priority: High  . IFG (impaired fasting glucose) 11/10/2017    Priority: High  . 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 Coronary artery disease     Priority: High  . Peripheral vascular disease with claudication ([place])     Priority: High  . History of abdominal aortic aneurysm repair 05/18/2011    Priority: High  . Hyperlipidemia LDL goal <70     Priority: High  . DJD (degenerative joint disease), lumbosacral 11/10/2017    Priority: Medium  . Fibromyalgia 11/10/2017    Priority: Medium  . GERD (gastroesophageal reflux disease) 11/10/2017    Priority: Medium  . Fatty liver 11/10/2017    Priority: Medium  . 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    Priority: Medium  . History of renal artery stenosis 05/18/2011    Priority: Medium  . Dyshidrotic eczema 11/10/2017    Priority: Low  . Osteopenia 11/10/2017    Priority: Low  . Does use hearing aid 11/10/2017    Priority: Low  . Hypomagnesemia 02/03/2021  . NSTEMI (non-ST elevated myocardial infarction) ([place]) 01/31/2021  . Diarrhea 01/06/2021   Current Meds  Medication Sig  . Accu-Chek FastClix Lancets MISC Check blood sugar once daily PRN  . albuterol (PROVENTIL HFA;VENTOLIN HFA) 108 (90 Base) MCG/ACT inhaler  Inhale 2 puffs into the lungs every 4 (four) hours as needed for wheezing or shortness of breath.  . ALPRAZolam (XANAX) 0.25 MG tablet Take 1 tablet (0.25 mg total) by mouth at bedtime as needed for sleep.  [name] aspirin EC 81 MG tablet Take 1 tablet (81 mg total) by mouth daily.  [name] atorvastatin (LIPITOR) 80 MG tablet TAKE 1 TABLET BY MOUTH EVERY DAY (Patient taking differently: Take 80 mg by mouth daily.)  . augmented betamethasone dipropionate (DIPROLENE-AF) 0.05 % cream Apply 1 application topically daily as needed (rash). For hands  . B Complex-Biotin-FA (B-100 COMPLEX PO) Take 1 tablet by mouth at bedtime.   . Budeson-Glycopyrrol-Formoterol (BREZTRI AEROSPHERE) 160-9-4.8 MCG/ACT  AERO Inhale 2 puffs into the lungs in the morning and at bedtime. (Patient taking differently: Inhale 1 puff into the lungs daily as needed (shortness of breath).)  . ciclopirox (LOPROX) 0.77 % cream Apply 1 application topically daily as needed (hands fungus).   . CORDRAN 4 MCG/SQCM TAPE Apply 1 each topically daily as needed (Rash).   . cycloSPORINE (RESTASIS) 0.05 % ophthalmic emulsion Place 1 drop into both eyes 2 (two) times daily.   . diclofenac (VOLTAREN) 75 MG EC tablet Take 1 tablet (75 mg total) by mouth 2 (two) times daily as needed.  . gabapentin (NEURONTIN) 300 MG capsule Take 1-2 capsules (300-600 mg total) by mouth 3 (three) times daily. (Patient taking differently: Take 300 mg by mouth 2 (two) times daily.)  . glucose blood (ACCU-CHEK SMARTVIEW) test strip CHECK BLOOD SUGAR ONCE DAILY AS NEEDED  . HYDROcodone-acetaminophen (NORCO) 10-325 MG tablet Take 1 tablet by mouth every 6 (six) hours as needed.  . hydrOXYzine (ATARAX/VISTARIL) 25 MG tablet Take 1 tablet (25 mg total) by mouth 3 (three) times daily as needed.  [name] losartan-hydrochlorothiazide (HYZAAR) 100-25 MG tablet Take 0.5 tablets by mouth daily.  . metoprolol tartrate (LOPRESSOR) 25 MG tablet Take 1 tablet (25 mg total) by mouth 2 (two) times daily.  . metoprolol tartrate (LOPRESSOR) 25 MG tablet TAKE 1 TABLET (25 MG TOTAL) BY MOUTH TWO TIMES DAILY.  . Multiple Vitamins-Minerals (MULTIVITAMIN WITH MINERALS) tablet Take 1 tablet by mouth at bedtime.  . nitroGLYCERIN (NITROSTAT) 0.4 MG SL tablet Place 1 tablet (0.4 mg total) under the tongue every 5 (five) minutes as needed for chest pain.  [name] nystatin (MYCOSTATIN) 100000 UNIT/ML suspension Take 5 mLs (500,000 Units total) by mouth 4 (four) times daily.  [name] omeprazole (PRILOSEC) 20 MG capsule Take 1 capsule (20 mg total) by mouth daily.  . ondansetron (ZOFRAN) 8 MG tablet Take by mouth every 8 (eight) hours as needed for nausea or vomiting.  . potassium chloride (KLOR-CON) 10 MEQ  tablet Take 1 tablet (10 mEq total) by mouth 2 (two) times daily.  . prasugrel (EFFIENT) 10 MG TABS tablet Take 1/2 tablet ( 5 Mg ) daily  . saccharomyces boulardii (FLORASTOR) 250 MG capsule Take 1 capsule (250 mg total) by mouth 2 (two) times daily. Any probiotic available is okay  . simethicone (MYLICON) 80 MG chewable tablet Chew 1 tablet (80 mg total) by mouth 4 (four) times daily as needed for flatulence (bloating). also available OTC  . ticagrelor (BRILINTA) 90 MG TABS tablet TAKE 1 TABLET (90 MG TOTAL) BY MOUTH TWO TIMES DAILY.  [name] torsemide (DEMADEX) 20 MG tablet Take 0.5 tablets (10 mg total) by mouth daily as needed (fluid).  . tretinoin (RETIN-A) 0.1 % cream Apply 1 application topically every other day. In the evening  .  VITAMIN D, ERGOCALCIFEROL, PO Take 1,000 Units by mouth every evening.     Allergies: Patient is allergic to anticoagulant compound, bextra [valdecoxib], hydrocod polst-cpm polst er, lipitor [atorvastatin], mevacor [lovastatin], plavix [clopidogrel bisulfate], zocor [simvastatin], doxycycline, metoprolol, morphine and related, codeine, and lisinopril. Family History: Patient family history includes AAA (abdominal aortic aneurysm) in her brother; Breast cancer in her sister; Colon cancer in her cousin and paternal grandmother; Diabetes in her brother and sister; Heart attack in her brother, father, mother, and sister; Heart disease in her brother, brother, father, mother, and sister; Hyperlipidemia in her brother and sister; Hypertension in her brother and sister; Liver cancer in her sister. Social History:  Patient  reports that she quit smoking about 25 years ago. Her smoking use included cigarettes. She smoked 0.00 packs per day for 37.00 years. She has never used smokeless tobacco. She reports current alcohol use of about 6.0 - 10.0 standard drinks of alcohol per week. She reports that she does not use drugs.  Review of Systems: Constitutional: Negative for fever  malaise or anorexia Cardiovascular: negative for chest pain Respiratory: negative for SOB or persistent cough Gastrointestinal: negative for abdominal pain  Objective  Vitals: There were no vitals taken for this visit. General: no acute distress , A&Ox3 Right wrist: No longer swollen, increased and improved range of motion.  Less tenderness.  Improved. Left wrist and hand: Normal-appearing, pain wrist movement.  No localized bony tenderness.  No warmth or erythema or swelling.  Osteoarthritic changes present     Commons side effects, risks, benefits, and alternatives for medications and treatment plan prescribed today were discussed, and the patient expressed understanding of the given instructions. Patient is instructed to call or message via MyChart if he/she has any questions or concerns regarding our treatment plan. No barriers to understanding were identified. We discussed Red Flag symptoms and signs in detail. Patient expressed understanding regarding what to do in case of urgent or emergency type symptoms.   Medication list was reconciled, printed and provided to the patient in AVS. Patient instructions and summary information was reviewed with the patient as documented in the AVS. This note was prepared with assistance of Dragon voice recognition software. Occasional wrong-word or sound-a-like substitutions may have occurred due to the inherent limitations of voice recognition software  This visit occurred during the SARS-CoV-2 public health emergency.  Safety protocols were in place, including screening questions prior to the visit, additional usage of staff PPE, and extensive cleaning of exam room while observing appropriate contact time as indicated for disinfecting solutions.

## 2021-03-16 NOTE — Patient Instructions (Addendum)
Please follow up as in 6 months for cpe. Cancel appt for April 14th.   Suspect arthritis flare or tendonitis due to overuse.  Rest, ice and intermittent diclofenac for pain.   If you have any questions or concerns, please don't hesitate to send me a message via MyChart or call the office at [phone]. Thank you for visiting with [place] today! It's our pleasure caring for you.

## 2021-03-17 DIAGNOSIS — H43813 Vitreous degeneration, bilateral: Secondary | ICD-10-CM | POA: Diagnosis not present

## 2021-03-17 LAB — IRON,TIBC AND FERRITIN PANEL
%SAT: 9 % (calc) — ABNORMAL LOW (ref 16–45)
Ferritin: 117 ng/mL (ref 16–288)
Iron: 23 ug/dL — ABNORMAL LOW (ref 45–160)
TIBC: 258 mcg/dL (calc) (ref 250–450)

## 2021-03-17 NOTE — Progress Notes (Signed)
Please call patient: I have reviewed his/her lab results. Sorry, iron levels remain a little low; please continue iron for 3 more months.

## 2021-03-19 ENCOUNTER — Ambulatory Visit: Payer: Medicare Other | Admitting: Family Medicine

## 2021-03-19 ENCOUNTER — Telehealth: Payer: Self-pay

## 2021-03-19 NOTE — Telephone Encounter (Signed)
See result note.  

## 2021-03-19 NOTE — Telephone Encounter (Signed)
Patient is returning a call form Katie.

## 2021-03-20 ENCOUNTER — Telehealth: Payer: Self-pay | Admitting: Cardiology

## 2021-03-20 NOTE — Telephone Encounter (Signed)
Pt c/o medication issue:   1. Name of Medication:  Prasugrel HCl     2. How are you currently taking this medication (dosage and times per day)? As directed  3. Are you having a reaction (difficulty breathing--STAT)? yes  4. What is your medication issue? Patient states that this medication is causing her hands to itch and turn red.

## 2021-03-20 NOTE — Telephone Encounter (Signed)
Attempt to return call to discuss-no answer and unable to leave VM.    See mychart encounter

## 2021-04-08 DIAGNOSIS — S32030A Wedge compression fracture of third lumbar vertebra, initial encounter for closed fracture: Secondary | ICD-10-CM | POA: Diagnosis not present

## 2021-04-08 DIAGNOSIS — M461 Sacroiliitis, not elsewhere classified: Secondary | ICD-10-CM | POA: Diagnosis not present

## 2021-04-08 DIAGNOSIS — Z9889 Other specified postprocedural states: Secondary | ICD-10-CM | POA: Diagnosis not present

## 2021-04-10 ENCOUNTER — Telehealth: Payer: Self-pay | Admitting: Family Medicine

## 2021-04-10 NOTE — Chronic Care Management (AMB) (Signed)
  Chronic Care Management   Note  04/10/2021 Name: Itali Mckendry MRN: [id number] DOB: 02-28-1946  Evelia Waskey is a 75 y.o. year old female who is a primary care patient of [name], MD. I reached out to [name] by phone today in response to a referral sent by Ms. Theda Belfast Lamorte's PCP, [name], MD.   Ms. Mcloughlin was given information about Chronic Care Management services today including:  1. CCM service includes personalized support from designated clinical staff supervised by her physician, including individualized plan of care and coordination with other care providers 2. 24/7 contact phone numbers for assistance for urgent and routine care needs. 3. Service will only be billed when office clinical staff spend 20 minutes or more in a month to coordinate care. 4. Only one practitioner may furnish and bill the service in a calendar month. 5. The patient may stop CCM services at any time (effective at the end of the month) by phone call to the office staff.   Patient did not agree to enrollment in care management services and does not wish to consider at this time.  Follow up plan:   Lauretta Grill [employer] Scheduler

## 2021-04-17 NOTE — Progress Notes (Signed)
Cardiology Office Note   Date:  04/20/2021   ID:  Megan [name], DOB June 04, 1946, MRN [id number]  PCP:  [name], MD  Cardiologist: Brizeyda Holtmeyer Martinique MD  Chief Complaint  Patient presents with  . Coronary Artery Disease      History of Present Illness: Megan [name] is a 75 y.o. female who is seen for follow up CAD.  She has a history  of CAD, HTN, HL, and PAD.   She is status post coronary artery bypass graft surgery in 1995. She had a renal artery stent for renal artery stenosis in 2008. She had a stent graft of an abdominal aortic aneurysm 2009. She is s/p stenting of the left iliac. She is followed by [name] for PAD. Last CT in May 2018. She had [place] in June 2019 showing no endoleak.    She was hospitalized in July 2014 and had cardiac catheterization which showed that her grafts were open. EF normal.  She was hospitalized 11/15/2014 with which he describes as an aching sensation in her chest.  She underwent a treadmill Myoview on 11/17/14 which showed no ischemia and her ejection fraction was 86%.   In June 2019 she had Abdominal US at [facility] showing AAA of 4 cm.   In October 2019 she underwent right rotator cuff repair. No complications.   She was  having progressive issues with her low back that limited her activity and caused pain. She has exhausted conservative measures and underwent lumbar fusion in July by Dr Ronnald Ramp.   She was in the hospital  1/31 and discharged on 2/8- for AKI, diarrhea- diverticulitis (sigmoid colon), metabolic encephalopathy, UTI. Echo at that time was OK with normal EF and moderate TR.   She was re- admitted 2/26-02/03/21 with NSTEMI. Troponin up to 1085. Ecg showed significant inferolateral TWA. She underwent cardiac cath demonstrating patent LIMA to the LAD. The native RCA was occluded and she had severe sequential lesions in the SVG to RCA. This was treated with overlapping DES x 2. Procedure was complicated by a left forearm hematoma.  She was treated with DAPT with ASA and Brilinta. On metoprolol and high dose statin.   She was admitted for overnight observation on 02/11/21 after presenting with a localized ache in her left chest. This was different than when she presented with her NSTEMI. Ecg showed no new changes and troponin was negative x 3  On her last visit she complained of increased SOB- this was felt to be related to [place]. We switched to Plavix but she had an allergic reaction with rash and hives. Later switched to Prasugrel- reduced dose based on age and female.    On follow up today she is tolerating Prasugrel well. She does have a head cold. Feels a little off balance today. Notes occasional night sweats. No chest pain.    Past Medical History:  Diagnosis Date  . AAA (abdominal aortic aneurysm) ([place])    a. s/p stent grafting in 2009.  . Adenomatous colon polyp 05/2001  . Adenomatous duodenal polyp   . Allergy   . Anginal pain ([place])   . COPD (chronic obstructive pulmonary disease) ([place])    pt denies COPD  . Coronary artery disease    a. s/p CABG in 1995;  b. 05/2013 Neg MV, EF 82%;  c. 06/2013 Cath: LM nl, LAD 40-50p, LCX nl, RCA 80p/128m, VG->RCA->PDA->PLSA min irregs, LIMA->LAD atretic, vigorous LV fxn.  . Diabetes mellitus without complication ([place])  history of, resolved. 2017  . Diverticulosis   . Eczema, dyshidrotic   . Eczema, dyshidrotic 1986   Dr. Mathis Fare  . Emphysema of lung ([place])   . Fatty liver   . Fibromyalgia   . GERD (gastroesophageal reflux disease)   . Hyperlipidemia   . Hypertension   . Migraine   . Myocardial infarction ([place])   . Osteoarthritis   . Osteoporosis    unsure, having a bone scan soon  . Peripheral arterial disease ([facility])    left leg diagnosed by Dr Mare Ferrari  . Renal artery stenosis ([place])    a. 2008 s/p PTA    Past Surgical History:  Procedure Laterality Date  . ABDOMINAL AORTAGRAM N/A 03/06/2014   Procedure: ABDOMINAL AORTAGRAM;  Surgeon: Rosetta Posner, MD;   Location: [facility] CATH LAB;  Service: Cardiovascular;  Laterality: N/A;  . ABDOMINAL AORTIC ANEURYSM REPAIR  2009   stenting  . ABDOMINAL HYSTERECTOMY     Total, 1992  . BREAST BIOPSY    . CARDIAC CATHETERIZATION    . CARDIOVASCULAR STRESS TEST  11/11/2009   normal study  . CARPAL TUNNEL RELEASE  08/1993   left wrist  . CARPAL TUNNEL RELEASE  01/1997   right  . CATARACT EXTRACTION, BILATERAL  2021  . CORONARY ARTERY BYPASS GRAFT  07/1994   Triple by Dr Ceasar Mons  . CORONARY STENT INTERVENTION N/A 02/02/2021   Procedure: CORONARY STENT INTERVENTION;  Surgeon: Nelva Bush, MD;  Location: [place] CV LAB;  Service: Cardiovascular;  Laterality: N/A;  . dental implants    . EYE SURGERY  03/2020   cataract  . HAND SURGERY Right 09/2002   Right hand pulley release  . LEFT HEART CATH AND CORS/GRAFTS ANGIOGRAPHY N/A 02/02/2021   Procedure: LEFT HEART CATH AND CORS/GRAFTS ANGIOGRAPHY;  Surgeon: Nelva Bush, MD;  Location: [place] CV LAB;  Service: Cardiovascular;  Laterality: N/A;  . LEFT HEART CATHETERIZATION WITH CORONARY/GRAFT ANGIOGRAM N/A 06/11/2013   Procedure: LEFT HEART CATHETERIZATION WITH [name];  Surgeon: Thayer Headings, MD;  Location: [facility] C[facility] LAB;  Service: Cardiovascular;  Laterality: N/A;  . MULTIPLE TOOTH EXTRACTIONS  2000   All upper teeth.  Has full dneture.   [name] RENAL ARTERY STENT  2008  . SHOULDER ARTHROSCOPY WITH ROTATOR CUFF REPAIR Right 09/27/2018   Procedure: Right shoulder mini open rotator cuff repair;  Surgeon: Susa Day, MD;  Location: [facility];  Service: Orthopedics;  Laterality: Right;  90 mins  . thyroid cyst apiration  05/1997 and 07/1998  . TONSILECTOMY, ADENOIDECTOMY, BILATERAL MYRINGOTOMY AND TUBES  1989  . TONSILLECTOMY    . TOTAL ABDOMINAL HYSTERECTOMY  1992     Current Outpatient Medications  Medication Sig Dispense Refill  . Accu-Chek FastClix Lancets MISC Check blood sugar once daily PRN 100 each 1  . albuterol  (PROVENTIL HFA;VENTOLIN HFA) 108 (90 Base) MCG/ACT inhaler Inhale 2 puffs into the lungs every 4 (four) hours as needed for wheezing or shortness of breath. 1 Inhaler 2  . ALPRAZolam (XANAX) 0.25 MG tablet Take 1 tablet (0.25 mg total) by mouth at bedtime as needed for sleep. 90 tablet 3  . aspirin EC 81 MG tablet Take 1 tablet (81 mg total) by mouth daily. 90 tablet 3  . atorvastatin (LIPITOR) 80 MG tablet TAKE 1 TABLET BY MOUTH EVERY DAY (Patient taking differently: Take 80 mg by mouth daily.) 90 tablet 3  . augmented betamethasone dipropionate (DIPROLENE-AF) 0.05 % cream Apply 1 application topically daily as  needed (rash). For hands  1  . B Complex-Biotin-FA (B-100 COMPLEX PO) Take 1 tablet by mouth at bedtime.     . Budeson-Glycopyrrol-Formoterol (BREZTRI AEROSPHERE) 160-9-4.8 MCG/ACT AERO Inhale 2 puffs into the lungs in the morning and at bedtime. (Patient taking differently: Inhale 1 puff into the lungs daily as needed (shortness of breath).) 10.7 g 5  . ciclopirox (LOPROX) 0.77 % cream Apply 1 application topically daily as needed (hands fungus).     . CORDRAN 4 MCG/SQCM TAPE Apply 1 each topically daily as needed (Rash).     . cycloSPORINE (RESTASIS) 0.05 % ophthalmic emulsion Place 1 drop into both eyes 2 (two) times daily.     . diclofenac (VOLTAREN) 75 MG EC tablet Take 1 tablet (75 mg total) by mouth 2 (two) times daily as needed. 30 tablet 0  . gabapentin (NEURONTIN) 300 MG capsule Take 1-2 capsules (300-600 mg total) by mouth 3 (three) times daily. (Patient taking differently: Take 300 mg by mouth 2 (two) times daily.) 540 capsule 4  . glucose blood (ACCU-CHEK SMARTVIEW) test strip CHECK BLOOD SUGAR ONCE DAILY AS NEEDED 100 strip 1  . hydrOXYzine (ATARAX/VISTARIL) 25 MG tablet Take 1 tablet (25 mg total) by mouth 3 (three) times daily as needed. 30 tablet 0  . losartan-hydrochlorothiazide (HYZAAR) 100-25 MG tablet Take 0.5 tablets by mouth daily. 90 tablet 0  . metoprolol tartrate  (LOPRESSOR) 25 MG tablet Take 1 tablet (25 mg total) by mouth 2 (two) times daily. 120 tablet 3  . metoprolol tartrate (LOPRESSOR) 25 MG tablet TAKE 1 TABLET (25 MG TOTAL) BY MOUTH TWO TIMES DAILY. 60 tablet 1  . Multiple Vitamins-Minerals (MULTIVITAMIN WITH MINERALS) tablet Take 1 tablet by mouth at bedtime.    . nitroGLYCERIN (NITROSTAT) 0.4 MG SL tablet Place 1 tablet (0.4 mg total) under the tongue every 5 (five) minutes as needed for chest pain. 25 tablet 11  . nystatin (MYCOSTATIN) 100000 UNIT/ML suspension Take 5 mLs (500,000 Units total) by mouth 4 (four) times daily. 80 mL 0  . omeprazole (PRILOSEC) 20 MG capsule Take 1 capsule (20 mg total) by mouth daily. 90 capsule 3  . ondansetron (ZOFRAN) 8 MG tablet Take by mouth every 8 (eight) hours as needed for nausea or vomiting.    [name] oxyCODONE-acetaminophen (PERCOCET) 10-325 MG tablet Take 1 tablet by mouth every 6 (six) hours as needed.    . potassium chloride (KLOR-CON) 10 MEQ tablet Take 1 tablet (10 mEq total) by mouth 2 (two) times daily.    . prasugrel (EFFIENT) 10 MG TABS tablet Take 1/2 tablet ( 5 Mg ) daily 45 tablet 3  . saccharomyces boulardii (FLORASTOR) 250 MG capsule Take 1 capsule (250 mg total) by mouth 2 (two) times daily. Any probiotic available is okay 60 capsule 0  . simethicone (MYLICON) 80 MG chewable tablet Chew 1 tablet (80 mg total) by mouth 4 (four) times daily as needed for flatulence (bloating). also available OTC 30 tablet 0  . ticagrelor (BRILINTA) 90 MG TABS tablet TAKE 1 TABLET (90 MG TOTAL) BY MOUTH TWO TIMES DAILY. 180 tablet 2  . torsemide (DEMADEX) 20 MG tablet Take 0.5 tablets (10 mg total) by mouth daily as needed (fluid).    . tretinoin (RETIN-A) 0.1 % cream Apply 1 application topically every other day. In the evening    . VITAMIN D, ERGOCALCIFEROL, PO Take 1,000 Units by mouth every evening.      No current facility-administered medications for this visit.  Allergies:   Anticoagulant compound, Bextra  [valdecoxib], Hydrocod polst-cpm polst er, Lipitor [atorvastatin], Mevacor [lovastatin], Plavix [clopidogrel bisulfate], Zocor [simvastatin], Doxycycline, Metoprolol, Morphine and related, Codeine, and Lisinopril    Social History:  The patient  reports that she quit smoking about 25 years ago. Her smoking use included cigarettes. She smoked 0.00 packs per day for 37.00 years. She has never used smokeless tobacco. She reports current alcohol use of about 6.0 - 10.0 standard drinks of alcohol per week. She reports that she does not use drugs.   Family History:  The patient's family history includes AAA (abdominal aortic aneurysm) in her brother; Breast cancer in her sister; Colon cancer in her cousin and paternal grandmother; Diabetes in her brother and sister; Heart attack in her brother, father, mother, and sister; Heart disease in her brother, brother, father, mother, and sister; Hyperlipidemia in her brother and sister; Hypertension in her brother and sister; Liver cancer in her sister.    ROS:  Please see the history of present illness.   Otherwise, review of systems are positive for none.   All other systems are reviewed and negative.    PHYSICAL EXAM: VS:  BP (!) 124/52   Pulse 73   Ht 4' 11.5" (1.511 m)   Wt 124 lb 9.6 oz (56.5 kg)   SpO2 93%   BMI 24.74 kg/m  , BMI Body mass index is 24.74 kg/m. GENERAL:  Overweight  WF in NAD HEENT:  PERRL, EOMI, sclera are clear. Oropharynx is clear. NECK:  No jugular venous distention, carotid upstroke brisk and symmetric, no bruits, no thyromegaly or adenopathy LUNGS:  Clear to auscultation bilaterally CHEST:  Unremarkable HEART:  RRR,  PMI not displaced or sustained,S1 and S2 within normal limits, no S3, no S4: no clicks, no rubs, no murmurs ABD:  Soft, nontender. BS +, no masses or bruits. No hepatomegaly, no splenomegaly EXT:  1 + pulses throughout, no edema, no cyanosis no clubbing SKIN:  Warm and dry.  No rashes NEURO:  Alert and  oriented x 3. Cranial nerves II through XII intact. PSYCH:  Cognitively intact     EKG:  EKG is not done today.     Recent Labs: 06/13/2020: TSH 1.81 01/08/2021: B Natriuretic Peptide 862.1 03/16/2021: ALT 12; BUN 9; Creatinine, Ser 0.61; Hemoglobin 12.6; Magnesium 1.6; Platelets 316.0; Potassium 3.7; Sodium 138   Labs from primary care 12/15/16: A1c 5.6%. : Cholesterol 167 Trig-209, HDL 52, LDL 73.  AST 51, ALT 65. Normal renal indices. TSH is normal. Dated 06/24/17: CRP <0.3. Glucose 120, AST 58, ALT 80, other chemistries normal.  Dated 09/28/18: normal CBC, CMET  Lipid Panel    Component Value Date/Time   CHOL 160 06/13/2020 1017   CHOL 154 01/01/2020 0835   TRIG 294.0 (H) 06/13/2020 1017   HDL 51.10 06/13/2020 1017   HDL 50 01/01/2020 0835   CHOLHDL 3 06/13/2020 1017   VLDL 58.8 (H) 06/13/2020 1017   LDLCALC 64 01/01/2020 0835   LDLDIRECT 82.0 06/13/2020 1017      Wt Readings from Last 3 Encounters:  04/20/21 124 lb 9.6 oz (56.5 kg)  03/13/21 122 lb 3.2 oz (55.4 kg)  02/25/21 126 lb 4 oz (57.3 kg)    Cardiac cath / PCI 02/02/21: CORONARY STENT INTERVENTION  LEFT HEART CATH AND CORS/GRAFTS ANGIOGRAPHY    Conclusion  Conclusions: 1. Severe single-vessel coronary artery disease with chronic total occlusion of mid RCA. 2. Mild to moderate, diffuse LAD disease with competitive flow in the  distal vessel from LIMA-LAD. 3. Small but patent LIMA-LAD. 4. Patent SVG-rPDA-rPL with diffuse ostial/proximal disease of up to 60% and tandem hazy 95% and 90% mid graft lesions. 5. Successful PCI to ostial/proximal and mid SVG-rPDA-rPL using non-overlapping Resolute Onyx 3.0 x 26 mm and 3.5 x 34 mm drug-eluting stents with 0% residual stenosis and TIMI-3 flow. 6. Left forearm hematoma following sheath removal ant TR band placement, controlled with manual compression and placement of second TR band proximal to the first band.  Recommendations: 1. Dual antiplatelet therapy with aspirin  and ticagrelor for at least 12 months. 2. Aggressive secondary prevention. 3. Close monitoring of left forearm hematoma.  Nelva Bush, MD [facility]  Coronary Diagrams   Diagnostic Dominance: Right    Intervention     Implants      Echo 01/31/21: IMPRESSIONS    1. Left ventricular ejection fraction, by estimation, is 60 to 65%. The  left ventricle has normal function. The left ventricle has no regional  wall motion abnormalities. Left ventricular diastolic parameters are  consistent with Grade I diastolic  dysfunction (impaired relaxation).  2. Right ventricular systolic function is normal. The right ventricular  size is normal. There is normal pulmonary artery systolic pressure.  3. Left atrial size was severely dilated.  4. The mitral valve is grossly normal. Mild mitral valve regurgitation:  Apperance of two jets; study may underestimated regurgitation severity.  5. The aortic valve is tricuspid. Aortic valve regurgitation is not  visualized. No aortic stenosis is present.  6. The inferior vena cava is normal in size with greater than 50%  respiratory variability, suggesting right atrial pressure of 3 mmHg.   Comparison(s): A prior study was performed on 01/09/21. No significant  change from prior study. Prior images reviewed side by side. Similar to  prior.     ASSESSMENT AND PLAN:  1.CAD status post CABG in 1995. Recent NSTEMI. Cardiac cath demonstrated patent LIMA to the LAD. Occluded native RCA. SVG to PDA/PL had severe sequential stenoses treated with DES x 2. Normal EF. Had atypical chest pain the following week and troponins were normal. Now on DAPT. Developed dyspnea on Brilinta. Allergic reaction to Plavix. Now on Prasugrel-  lower dose due to age and female sex. Continue DAPT for one year.  2. HTN with hypertensive heart disease. BP is under  good control. Now on metoprolol.  3. Hypercholesterolemia- LDL is at goal but triglycerides  persistently elevated.   4.  Status post stent to right renal artery 2008. Patent by CT in October  5. Status post stent graft to abdominal aortic aneurysm 2009.  S/p iliac stent in 2016. Stable by CT in October  6. Peripheral artery disease followed by Dr. Donzetta Matters  7. Lumbar spine disease. S/p fusion with persistent pain. Unable to have a myelogram preferably for 6 months due to need for DAPT  Follow up in  6  months.   Signed, Shekina Cordell Martinique MD, [facility]    04/20/2021 2:12 PM    [place]

## 2021-04-20 ENCOUNTER — Ambulatory Visit: Payer: Medicare Other | Admitting: Cardiology

## 2021-04-20 ENCOUNTER — Other Ambulatory Visit: Payer: Self-pay

## 2021-04-20 ENCOUNTER — Encounter: Payer: Self-pay | Admitting: Cardiology

## 2021-04-20 VITALS — BP 124/52 | HR 73 | Ht 59.5 in | Wt 124.6 lb

## 2021-04-20 DIAGNOSIS — E782 Mixed hyperlipidemia: Secondary | ICD-10-CM

## 2021-04-20 DIAGNOSIS — E119 Type 2 diabetes mellitus without complications: Secondary | ICD-10-CM

## 2021-04-20 DIAGNOSIS — I739 Peripheral vascular disease, unspecified: Secondary | ICD-10-CM | POA: Diagnosis not present

## 2021-04-20 DIAGNOSIS — I714 Abdominal aortic aneurysm, without rupture: Secondary | ICD-10-CM | POA: Diagnosis not present

## 2021-04-20 DIAGNOSIS — I25708 Atherosclerosis of coronary artery bypass graft(s), unspecified, with other forms of angina pectoris: Secondary | ICD-10-CM | POA: Diagnosis not present

## 2021-04-20 DIAGNOSIS — I119 Hypertensive heart disease without heart failure: Secondary | ICD-10-CM

## 2021-04-20 LAB — HEPATIC FUNCTION PANEL
ALT: 27 IU/L (ref 0–32)
AST: 30 IU/L (ref 0–40)
Albumin: 4 g/dL (ref 3.7–4.7)
Alkaline Phosphatase: 77 IU/L (ref 44–121)
Bilirubin Total: 0.3 mg/dL (ref 0.0–1.2)
Bilirubin, Direct: 0.12 mg/dL (ref 0.00–0.40)
Total Protein: 5.7 g/dL — ABNORMAL LOW (ref 6.0–8.5)

## 2021-04-20 LAB — CBC WITH DIFFERENTIAL/PLATELET
Basophils Absolute: 0 10*3/uL (ref 0.0–0.2)
Basos: 1 %
EOS (ABSOLUTE): 0.2 10*3/uL (ref 0.0–0.4)
Eos: 3 %
Hematocrit: 45.4 % (ref 34.0–46.6)
Hemoglobin: 14.8 g/dL (ref 11.1–15.9)
Immature Grans (Abs): 0 10*3/uL (ref 0.0–0.1)
Immature Granulocytes: 0 %
Lymphocytes Absolute: 1.7 10*3/uL (ref 0.7–3.1)
Lymphs: 26 %
MCH: 29.2 pg (ref 26.6–33.0)
MCHC: 32.6 g/dL (ref 31.5–35.7)
MCV: 90 fL (ref 79–97)
Monocytes Absolute: 0.7 10*3/uL (ref 0.1–0.9)
Monocytes: 10 %
Neutrophils Absolute: 3.9 10*3/uL (ref 1.4–7.0)
Neutrophils: 60 %
Platelets: 214 10*3/uL (ref 150–450)
RBC: 5.06 x10E6/uL (ref 3.77–5.28)
RDW: 12.8 % (ref 11.7–15.4)
WBC: 6.5 10*3/uL (ref 3.4–10.8)

## 2021-04-20 LAB — BASIC METABOLIC PANEL
BUN/Creatinine Ratio: 16 (ref 12–28)
BUN: 10 mg/dL (ref 8–27)
CO2: 26 mmol/L (ref 20–29)
Calcium: 9.7 mg/dL (ref 8.7–10.3)
Chloride: 101 mmol/L (ref 96–106)
Creatinine, Ser: 0.61 mg/dL (ref 0.57–1.00)
Glucose: 90 mg/dL (ref 65–99)
Potassium: 4.3 mmol/L (ref 3.5–5.2)
Sodium: 140 mmol/L (ref 134–144)
eGFR: 93 mL/min/{1.73_m2} (ref >59)

## 2021-04-20 LAB — LIPID PANEL
Chol/HDL Ratio: 2.6 ratio (ref 0.0–4.4)
Cholesterol, Total: 156 mg/dL (ref 100–199)
HDL: 59 mg/dL (ref >39)
LDL Chol Calc (NIH): 73 mg/dL (ref 0–99)
Triglycerides: 142 mg/dL (ref 0–149)
VLDL Cholesterol Cal: 24 mg/dL (ref 5–40)

## 2021-04-20 NOTE — Progress Notes (Signed)
.  h 

## 2021-04-21 LAB — HEMOGLOBIN A1C
Est. average glucose Bld gHb Est-mCnc: 117 mg/dL
Hgb A1c MFr Bld: 5.7 % — ABNORMAL HIGH (ref 4.8–5.6)

## 2021-04-28 ENCOUNTER — Other Ambulatory Visit: Payer: Self-pay

## 2021-05-05 ENCOUNTER — Telehealth: Payer: Self-pay | Admitting: Gastroenterology

## 2021-05-05 DIAGNOSIS — I10 Essential (primary) hypertension: Secondary | ICD-10-CM | POA: Diagnosis not present

## 2021-05-05 NOTE — Telephone Encounter (Signed)
Inbound call from patient. States she would like [name] to continue to be in charge of refilling the Omeprazole and wants 40mg  since she is taking 2 a day. Best contact number [phone]

## 2021-05-05 NOTE — Telephone Encounter (Signed)
Called patient with no answer and no voicemail to leave a message. 

## 2021-05-06 NOTE — Telephone Encounter (Signed)
Called patient with no answer and no voicemail to leave a message. 

## 2021-05-07 NOTE — Telephone Encounter (Signed)
Will wait for patient to return my call.

## 2021-05-07 NOTE — Telephone Encounter (Signed)
Called patient with no answer and no voicemail to leave a message. 

## 2021-05-11 MED ORDER — OMEPRAZOLE 40 MG PO CPDR: 40.0000 mg | DELAYED_RELEASE_CAPSULE | Freq: Two times a day (BID) | ORAL | 3 refills | Status: DC

## 2021-05-11 NOTE — Telephone Encounter (Signed)
Returned call to patient regarding Omeprazole dosage.  Patient states she was restarted on Omeprazole 40mg  twice daily by Dr Martinique in April 2022 after having possible drug interaction with Plavix and Dexilant.  She was switched from Plavix to Effient and from [place] to Omeprazole at that time.  She would like Dr Fuller Plan to maintain refills on Omeprazole as he has in the past.

## 2021-05-11 NOTE — Telephone Encounter (Signed)
Left message on voicemail that Omeprazole 40mg  twice daily sent to pharmacy as requested. Advised to return call with any questions or concerns.

## 2021-05-11 NOTE — Addendum Note (Signed)
Addended by: Stevan Born on: 05/11/2021 04:06 PM   Modules accepted: Orders

## 2021-05-11 NOTE — Telephone Encounter (Signed)
Patient returned call. Best contact number [phone]

## 2021-05-11 NOTE — Telephone Encounter (Signed)
Dr. Doug Sou note on 03/06/2021 and her medication list indicate omeprazole 20 mg po qd. May have to confirm with the correct dose and schedule with her pharmacy. If omeprazole 20 mg po qd is correct OK to provide refills for 1 year. If not correct please send me a message after clarifying.

## 2021-05-11 NOTE — Telephone Encounter (Signed)
OK for omeprazole 40 mg po bid with refills for 1 year.

## 2021-05-13 DIAGNOSIS — M7061 Trochanteric bursitis, right hip: Secondary | ICD-10-CM | POA: Diagnosis not present

## 2021-05-13 DIAGNOSIS — M7062 Trochanteric bursitis, left hip: Secondary | ICD-10-CM | POA: Diagnosis not present

## 2021-05-18 ENCOUNTER — Telehealth: Payer: Self-pay | Admitting: Cardiology

## 2021-05-18 NOTE — Telephone Encounter (Signed)
*  STAT* If patient is at the pharmacy, call can be transferred to refill team.   1. Which medications need to be refilled? (please list name of each medication and dose if known) potassium chloride (KLOR-CON) 10 MEQ tablet  2. Which pharmacy/location (including street and city if local pharmacy) is medication to be sent to? [id number] - [place], [place] - [address]  3. Do they need a 30 day or 90 day supply? 90 day supply

## 2021-05-26 MED ORDER — POTASSIUM CHLORIDE ER 10 MEQ PO TBCR: 10.0000 meq | EXTENDED_RELEASE_TABLET | Freq: Two times a day (BID) | ORAL | 3 refills | Status: DC

## 2021-05-26 NOTE — Telephone Encounter (Signed)
Let patient know that I sent in refills for her Potassium Chloride over to the pharmacy.

## 2021-05-26 NOTE — Telephone Encounter (Signed)
*  STAT* If patient is at the pharmacy, call can be transferred to refill team.   1. Which medications need to be refilled? (please list name of each medication and dose if known)   potassium chloride (KLOR-CON) 10 MEQ tablet    2. Which pharmacy/location (including street and city if local pharmacy) is medication to be sent to? [id number] - [place], [place] - [address]  3. Do they need a 30 day or 90 day supply? 90 day   Patient 4 and a half days left

## 2021-06-09 ENCOUNTER — Telehealth: Payer: Self-pay | Admitting: Cardiology

## 2021-06-09 NOTE — Telephone Encounter (Signed)
  Pt c/o medication issue:  1. Name of Medication: prasugrel (EFFIENT) 5 MG TABS tablet  2. How are you currently taking this medication (dosage and times per day)? 5 mg once daily   3. Are you having a reaction (difficulty breathing--STAT)?   4. What is your medication issue? Patient has noticed that she is bruising herself easily. She is on a blood thinner but is not sure what to do.

## 2021-06-09 NOTE — Telephone Encounter (Signed)
Spoke with pt who wants to report increased bruising on her arms and legs. She states this only happens when she hits her arms or legs on an object and they are not spontaneous. She says the amount of bruising has increased over the last several months.She denies pain or warmth in the bruising areas.  We discussed how this is a common SE of anti-platelet medications. She states she cannot take another anti-platelet so she understands if "this is how it is." She just wanted Dr. Martinique and his nurse to be aware.  Will forward to MD's RN for follow up if needed.

## 2021-06-15 ENCOUNTER — Ambulatory Visit: Payer: Medicare Other

## 2021-06-15 ENCOUNTER — Encounter: Payer: Medicare Other | Admitting: Family Medicine

## 2021-06-16 ENCOUNTER — Encounter: Payer: Medicare Other | Admitting: Family Medicine

## 2021-06-17 ENCOUNTER — Other Ambulatory Visit: Payer: Self-pay

## 2021-06-17 DIAGNOSIS — E782 Mixed hyperlipidemia: Secondary | ICD-10-CM

## 2021-06-17 DIAGNOSIS — I25810 Atherosclerosis of coronary artery bypass graft(s) without angina pectoris: Secondary | ICD-10-CM

## 2021-06-17 DIAGNOSIS — E119 Type 2 diabetes mellitus without complications: Secondary | ICD-10-CM

## 2021-06-17 NOTE — Progress Notes (Signed)
Et  

## 2021-06-17 NOTE — Telephone Encounter (Signed)
Called patient left message on personal voice mail she is due for a follow up visit with Dr.Jordan in Nov.Advised Nov schedule is out.She can call back to schedule.

## 2021-06-19 ENCOUNTER — Other Ambulatory Visit: Payer: Self-pay

## 2021-06-19 ENCOUNTER — Encounter: Payer: Medicare Other | Admitting: Family Medicine

## 2021-06-19 ENCOUNTER — Ambulatory Visit: Payer: Medicare Other

## 2021-06-19 ENCOUNTER — Encounter: Payer: Self-pay | Admitting: Family Medicine

## 2021-06-19 ENCOUNTER — Ambulatory Visit (INDEPENDENT_AMBULATORY_CARE_PROVIDER_SITE_OTHER): Payer: Medicare Other | Admitting: Family Medicine

## 2021-06-19 VITALS — BP 130/66 | HR 66 | Temp 97.6°F | Ht 59.5 in | Wt 121.4 lb

## 2021-06-19 DIAGNOSIS — R7301 Impaired fasting glucose: Secondary | ICD-10-CM | POA: Diagnosis not present

## 2021-06-19 DIAGNOSIS — I10 Essential (primary) hypertension: Secondary | ICD-10-CM | POA: Diagnosis not present

## 2021-06-19 DIAGNOSIS — I25810 Atherosclerosis of coronary artery bypass graft(s) without angina pectoris: Secondary | ICD-10-CM

## 2021-06-19 DIAGNOSIS — M47817 Spondylosis without myelopathy or radiculopathy, lumbosacral region: Secondary | ICD-10-CM

## 2021-06-19 DIAGNOSIS — J449 Chronic obstructive pulmonary disease, unspecified: Secondary | ICD-10-CM

## 2021-06-19 DIAGNOSIS — M797 Fibromyalgia: Secondary | ICD-10-CM | POA: Diagnosis not present

## 2021-06-19 DIAGNOSIS — E785 Hyperlipidemia, unspecified: Secondary | ICD-10-CM

## 2021-06-19 DIAGNOSIS — Z0000 Encounter for general adult medical examination without abnormal findings: Secondary | ICD-10-CM | POA: Diagnosis not present

## 2021-06-19 DIAGNOSIS — K760 Fatty (change of) liver, not elsewhere classified: Secondary | ICD-10-CM | POA: Diagnosis not present

## 2021-06-19 DIAGNOSIS — Z8781 Personal history of (healed) traumatic fracture: Secondary | ICD-10-CM | POA: Diagnosis not present

## 2021-06-19 DIAGNOSIS — J4489 Other specified chronic obstructive pulmonary disease: Secondary | ICD-10-CM

## 2021-06-19 MED ORDER — GUAIFENESIN-CODEINE 100-10 MG/5ML PO SOLN: 5.0000 mL | Freq: Four times a day (QID) | ORAL | 0 refills | Status: DC | PRN

## 2021-06-19 MED ORDER — AZITHROMYCIN 250 MG PO TABS: ORAL_TABLET | ORAL | 0 refills | Status: DC

## 2021-06-19 NOTE — Progress Notes (Signed)
Subjective  Chief Complaint  Patient presents with   Annual Exam    HPI: Megan [name] is a 75 y.o. female who presents to [place] [facility] today for a Female Wellness Visit. She also has the concerns and/or needs as listed above in the chief complaint. These will be addressed in addition to the Health Maintenance Visit.   Wellness Visit: annual visit with health maintenance review and exam without Pap  HM: screens are up to date Social: moved to an apartment two weeks; left the nursing home; husband remains there due to worsening dementia. Feels good about decision.  Chronic disease f/u and/or acute problem visit: (deemed necessary to be done in addition to the wellness visit): Multiple chronic medical problems reviewed. Reveiewd cardiology notes. Htn, hld, CAD and fatty liver all stable. On multple medications. Recent labs all reviewed and at goal or improved.  C/o bronchitis: x 10 days. Has copd. Some sob. Restarted breztri bid. No fevers.   Assessment  1. Annual physical exam   2. Primary hypertension   3. IFG (impaired fasting glucose)   4. Coronary artery disease involving coronary bypass graft of native heart without angina pectoris   5. Hyperlipidemia LDL goal <70   6. DJD (degenerative joint disease), lumbosacral   7. Fibromyalgia   8. COPD with chronic bronchitis ([place])   9. Fatty liver      Plan  Female Wellness Visit: Age appropriate Health Maintenance and Prevention measures were discussed with patient. Included topics are cancer screening recommendations, ways to keep healthy (see AVS) including dietary and exercise recommendations, regular eye and dental care, use of seat belts, and avoidance of moderate alcohol use and tobacco use.  BMI: discussed patient's BMI and encouraged positive lifestyle modifications to help get to or maintain a target BMI. HM needs and immunizations were addressed and ordered. See below for orders. See HM and  immunization section for updates. Routine labs and screening tests ordered including cmp, cbc and lipids where appropriate. Discussed recommendations regarding Vit D and calcium supplementation (see AVS)  Chronic disease management visit and/or acute problem visit: Chronic conditions remain controlled on meds. Agree with CCM consult/pharm med review. Bronchitis/copd: zpak, rob AC and breztri. No pred at the moment. Monitor.  Lbp: per NS No med changes today.  Looks much happier now that living independently again.   Follow up: 6 mo for recheck   No orders of the defined types were placed in this encounter.     Body mass index is 24.11 kg/m. 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     Coronary artery disease     Priority: High    a. s/p CABG in 1995;  b. 05/2013 Neg MV, EF 82%;  c. 06/2013 Cath: LM nl, LAD 40-50p, LCX nl, RCA 80p/187m, VG->RCA->PDA->PLSA min irregs, LIMA->LAD atretic, vigorous LV fxn.     Peripheral vascular disease with claudication [facility])     Priority: High  History of abdominal aortic aneurysm repair 05/18/2011    Priority: High    Stent graft 2009      Hyperlipidemia LDL goal <70     Priority: High   DJD (degenerative joint disease), lumbosacral 11/10/2017    Priority: Medium   Fibromyalgia 11/10/2017    Priority: Medium   GERD (gastroesophageal reflux disease) 11/10/2017    Priority: Medium   Fatty liver 11/10/2017    Priority: Medium   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    Priority: Medium    Severe by colonoscopy 09/2014, Dr.  Fuller Plan     History of renal artery stenosis 05/18/2011    Priority: Medium    Right renal artery stent 2008     Dyshidrotic eczema 11/10/2017    Priority: Low   Osteopenia 11/10/2017    Priority: Low    DEXA 10/2019, T = -1.3 lowest, osteopenia. Recheck 2-3 years.  Last Dexa 2009; declines further testing; does not tolerate biphosphanates     Does use hearing aid 11/10/2017    Priority: Low   Hypomagnesemia 02/03/2021   NSTEMI (non-ST elevated myocardial infarction) ([place]) 01/31/2021   Diarrhea 01/06/2021   Health Maintenance  Topic Date Due   OPHTHALMOLOGY EXAM  12/06/2020   COVID-19 Vaccine (4 - Booster for Pfizer series) 01/16/2021   INFLUENZA VACCINE  07/06/2021   HEMOGLOBIN A1C  10/21/2021   MAMMOGRAM  11/18/2021   TETANUS/TDAP  08/09/2022   COLONOSCOPY (Pts 45-107yrs Insurance coverage will need to be confirmed)  11/04/2024   Hepatitis C Screening  Completed   PNA vac Low Risk Adult  Completed   Zoster Vaccines- Shingrix  Completed   HPV VACCINES  Aged Out   FOOT EXAM  Discontinued   Immunization History  Administered Date(s) Administered   Fluad Quad(high Dose 65+) 07/30/2019   Influenza, High Dose Seasonal PF 08/13/2017, 08/26/2018, 09/15/2020   Influenza, Seasonal, Injecte, Preservative Fre 09/21/2014, 09/06/2015, 07/21/2016   Influenza,inj,quad, With Preservative 08/06/2017   Influenza-Unspecified 08/10/2017   PFIZER(Purple Top)SARS-COV-2 Vaccination 12/22/2019, 01/12/2020, 09/15/2020   PPD Test 09/13/1979   Pneumococcal Conjugate-13 12/31/2016   Pneumococcal Polysaccharide-23 12/16/2014, 08/01/2019   Td 05/13/2008   Tdap 08/09/2012   Zoster Recombinat (Shingrix) 01/13/2018, 03/14/2018   Zoster, Live 11/03/2010   We updated and reviewed the patient's past history in detail and it is documented below. Allergies: Patient is allergic to anticoagulant compound, bextra [valdecoxib], hydrocod polst-cpm polst er, lipitor [atorvastatin], mevacor [lovastatin],  plavix [clopidogrel bisulfate], zocor [simvastatin], doxycycline, metoprolol, morphine and related, codeine, and lisinopril. Past Medical History Patient  has a past medical history of AAA (abdominal aortic aneurysm) ([place]), Adenomatous colon polyp (05/2001), Adenomatous duodenal polyp, Allergy, Anginal pain ([place]), COPD (chronic obstructive pulmonary disease) ([place]), Coronary artery disease, Diabetes mellitus without complication ([place]), Diverticulosis, Eczema, dyshidrotic, Eczema, dyshidrotic (1986), Emphysema of lung ([place]), Fatty liver, Fibromyalgia, GERD (gastroesophageal reflux disease), Hyperlipidemia, Hypertension, Migraine, Myocardial infarction ([place]), Osteoarthritis, Osteoporosis, Peripheral arterial disease ([place]), and Renal artery stenosis ([place]). Past Surgical History Patient  has a past surgical history that includes Renal artery stent (2008); Coronary artery bypass graft (07/1994); Carpal tunnel release (08/1993); Tonsilectomy, adenoidectomy, bilateral myringotomy and tubes (1989); Total abdominal hysterectomy (1992); Carpal tunnel release (01/1997); thyroid cyst apiration (05/1997 and 07/1998); Cardiovascular stress test (11/11/2009); Cardiac catheterization; Abdominal aortic aneurysm repair (2009); left heart catheterization with coronary/graft angiogram (N/A, 06/11/2013); abdominal aortagram (N/A, 03/06/2014); Hand surgery (Right, 09/2002); Multiple tooth extractions (2000); Abdominal hysterectomy; dental implants; Breast biopsy; Shoulder arthroscopy with rotator cuff repair (Right, 09/27/2018); Cataract extraction, bilateral (2021);  Tonsillectomy; Eye surgery (03/2020); LEFT HEART CATH AND CORS/GRAFTS ANGIOGRAPHY (N/A, 02/02/2021); and CORONARY STENT INTERVENTION (N/A, 02/02/2021). Family History: Patient family history includes AAA (abdominal aortic aneurysm) in her brother; Breast cancer in her sister; Colon cancer in her cousin and paternal grandmother; Diabetes in her brother and sister; Heart attack in her  brother, father, mother, and sister; Heart disease in her brother, brother, father, mother, and sister; Hyperlipidemia in her brother and sister; Hypertension in her brother and sister; Liver cancer in her sister. Social History:  Patient  reports that she quit smoking about 25 years ago. Her smoking use included cigarettes. She has never used smokeless tobacco. She reports current alcohol use of about 6.0 - 10.0 standard drinks of alcohol per week. She reports that she does not use drugs.  Review of Systems: Constitutional: negative for fever or malaise Ophthalmic: negative for photophobia, double vision or loss of vision Cardiovascular: negative for chest pain, dyspnea on exertion, or new LE swelling Respiratory: negative for SOB or persistent cough Gastrointestinal: negative for abdominal pain, change in bowel habits or melena Genitourinary: negative for dysuria or gross hematuria, no abnormal uterine bleeding or disharge Musculoskeletal: negative for new gait disturbance or muscular weakness Integumentary: negative for new or persistent rashes, no breast lumps Neurological: negative for TIA or stroke symptoms Psychiatric: negative for SI or delusions Allergic/Immunologic: negative for hives  Patient Care Team    Relationship Specialty Notifications Start End  [name], MD PCP - General Family Medicine  05/21/13   [place], Peter M, MD PCP - Cardiology Cardiology Admissions 05/08/20   [place], Peter M, MD Consulting Physician Cardiology  05/24/17   Vicie Mutters, MD Consulting Physician Otolaryngology  11/10/17   Ladene Artist, MD Consulting Physician Gastroenterology  11/10/17   Penni Bombard, MD Consulting Physician Neurology  11/10/17   Reynold Bowen, MD Consulting Physician Endocrinology  11/10/17   Sharyne Peach, MD Consulting Physician Ophthalmology  11/10/17   Renelda Mom, DMD Consulting Physician Dentistry  11/10/17   Rolm Bookbinder, MD Consulting Physician Dermatology   01/12/18   Ortho, [facility]  Specialist  01/12/18   Woodfin Ganja  Dentistry  01/25/19     Objective  Vitals: BP 130/66 (BP Location: Left Arm, Patient Position: Sitting, Cuff Size: Normal)   Pulse 66   Temp 97.6 F (36.4 C) (Temporal)   Ht 4' 11.5" (1.511 m)   Wt 121 lb 6.1 oz (55.1 kg)   SpO2 94%   BMI 24.11 kg/m  General:  Well developed, well nourished, no acute distress . Looks much happier Psych:  Alert and orientedx3,normal mood and affect HEENT:  Normocephalic, atraumatic, non-icteric sclera,  supple neck without adenopathy, mass or thyromegaly Cardiovascular:  Normal S1, S2, RRR with murmur Respiratory:  Good breath sounds bilaterally, CTAB with normal respiratory effort no wheezing or rales Gastrointestinal: normal bowel sounds, soft, non-tender, no noted masses. No HSM MSK: no deformities, contusions. Joints are without erythema or swelling.  Skin:  Warm, no rashes or suspicious lesions noted, multiple bruises (on blood thinner) Neurologic:    Mental status is normal. Nl gait   Commons side effects, risks, benefits, and alternatives for medications and treatment plan prescribed today were discussed, and the patient expressed understanding of the given instructions. Patient is instructed to call or message via MyChart if he/she has any questions or concerns regarding our treatment plan. No barriers to understanding were identified. We discussed Red Flag symptoms and signs in detail. Patient expressed understanding regarding what to do  in case of urgent or emergency type symptoms.  Medication list was reconciled, printed and provided to the patient in AVS. Patient instructions and summary information was reviewed with the patient as documented in the AVS. This note was prepared with assistance of Dragon voice recognition software. Occasional wrong-word or sound-a-like substitutions may have occurred due to the inherent limitations of voice recognition software  This visit occurred during the  SARS-CoV-2 public health emergency.  Safety protocols were in place, including screening questions prior to the visit, additional usage of staff PPE, and extensive cleaning of exam room while observing appropriate contact time as indicated for disinfecting solutions.

## 2021-06-19 NOTE — Patient Instructions (Signed)
Please return in 6 months for recheck.   Take the zpak and cough medicine if needed.  Enjoy the remainder of your summer!  If you have any questions or concerns, please don't hesitate to send me a message via MyChart or call the office at [phone]. Thank you for visiting with [place] today! It's our pleasure caring for you.

## 2021-06-24 ENCOUNTER — Inpatient Hospital Stay (HOSPITAL_COMMUNITY)
Admission: EM | Admit: 2021-06-24 | Discharge: 2021-06-27 | DRG: 247 | Disposition: A | Discharge status: Home / Self Care | Payer: Medicare Other | Attending: Internal Medicine | Admitting: Internal Medicine

## 2021-06-24 ENCOUNTER — Emergency Department (HOSPITAL_COMMUNITY): Payer: Medicare Other

## 2021-06-24 ENCOUNTER — Telehealth: Payer: Self-pay | Admitting: Cardiology

## 2021-06-24 DIAGNOSIS — K219 Gastro-esophageal reflux disease without esophagitis: Secondary | ICD-10-CM | POA: Diagnosis present

## 2021-06-24 DIAGNOSIS — I252 Old myocardial infarction: Secondary | ICD-10-CM | POA: Diagnosis not present

## 2021-06-24 DIAGNOSIS — E785 Hyperlipidemia, unspecified: Secondary | ICD-10-CM | POA: Diagnosis not present

## 2021-06-24 DIAGNOSIS — R079 Chest pain, unspecified: Secondary | ICD-10-CM | POA: Diagnosis not present

## 2021-06-24 DIAGNOSIS — R0602 Shortness of breath: Secondary | ICD-10-CM | POA: Diagnosis not present

## 2021-06-24 DIAGNOSIS — Z90710 Acquired absence of both cervix and uterus: Secondary | ICD-10-CM

## 2021-06-24 DIAGNOSIS — Z87891 Personal history of nicotine dependence: Secondary | ICD-10-CM

## 2021-06-24 DIAGNOSIS — E041 Nontoxic single thyroid nodule: Secondary | ICD-10-CM | POA: Diagnosis not present

## 2021-06-24 DIAGNOSIS — Z8249 Family history of ischemic heart disease and other diseases of the circulatory system: Secondary | ICD-10-CM

## 2021-06-24 DIAGNOSIS — M797 Fibromyalgia: Secondary | ICD-10-CM | POA: Diagnosis not present

## 2021-06-24 DIAGNOSIS — Z79899 Other long term (current) drug therapy: Secondary | ICD-10-CM

## 2021-06-24 DIAGNOSIS — I200 Unstable angina: Secondary | ICD-10-CM | POA: Diagnosis not present

## 2021-06-24 DIAGNOSIS — J439 Emphysema, unspecified: Secondary | ICD-10-CM | POA: Diagnosis not present

## 2021-06-24 DIAGNOSIS — I10 Essential (primary) hypertension: Secondary | ICD-10-CM | POA: Diagnosis not present

## 2021-06-24 DIAGNOSIS — T82855A Stenosis of coronary artery stent, initial encounter: Secondary | ICD-10-CM | POA: Diagnosis not present

## 2021-06-24 DIAGNOSIS — Z7951 Long term (current) use of inhaled steroids: Secondary | ICD-10-CM | POA: Diagnosis not present

## 2021-06-24 DIAGNOSIS — I25700 Atherosclerosis of coronary artery bypass graft(s), unspecified, with unstable angina pectoris: Secondary | ICD-10-CM | POA: Diagnosis not present

## 2021-06-24 DIAGNOSIS — Z800 Family history of malignant neoplasm of digestive organs: Secondary | ICD-10-CM | POA: Diagnosis not present

## 2021-06-24 DIAGNOSIS — Z955 Presence of coronary angioplasty implant and graft: Secondary | ICD-10-CM

## 2021-06-24 DIAGNOSIS — Z803 Family history of malignant neoplasm of breast: Secondary | ICD-10-CM

## 2021-06-24 DIAGNOSIS — Z951 Presence of aortocoronary bypass graft: Secondary | ICD-10-CM | POA: Diagnosis not present

## 2021-06-24 DIAGNOSIS — Z7982 Long term (current) use of aspirin: Secondary | ICD-10-CM

## 2021-06-24 DIAGNOSIS — R0789 Other chest pain: Secondary | ICD-10-CM | POA: Diagnosis not present

## 2021-06-24 DIAGNOSIS — I25710 Atherosclerosis of autologous vein coronary artery bypass graft(s) with unstable angina pectoris: Secondary | ICD-10-CM | POA: Diagnosis not present

## 2021-06-24 DIAGNOSIS — Y832 Surgical operation with anastomosis, bypass or graft as the cause of abnormal reaction of the patient, or of later complication, without mention of misadventure at the time of the procedure: Secondary | ICD-10-CM | POA: Diagnosis present

## 2021-06-24 DIAGNOSIS — I25110 Atherosclerotic heart disease of native coronary artery with unstable angina pectoris: Principal | ICD-10-CM | POA: Diagnosis present

## 2021-06-24 DIAGNOSIS — M7061 Trochanteric bursitis, right hip: Secondary | ICD-10-CM | POA: Diagnosis not present

## 2021-06-24 DIAGNOSIS — K760 Fatty (change of) liver, not elsewhere classified: Secondary | ICD-10-CM | POA: Diagnosis not present

## 2021-06-24 DIAGNOSIS — Z20822 Contact with and (suspected) exposure to covid-19: Secondary | ICD-10-CM | POA: Diagnosis not present

## 2021-06-24 DIAGNOSIS — Z7902 Long term (current) use of antithrombotics/antiplatelets: Secondary | ICD-10-CM | POA: Diagnosis not present

## 2021-06-24 DIAGNOSIS — J9811 Atelectasis: Secondary | ICD-10-CM | POA: Diagnosis not present

## 2021-06-24 DIAGNOSIS — I209 Angina pectoris, unspecified: Secondary | ICD-10-CM | POA: Diagnosis present

## 2021-06-24 DIAGNOSIS — M7062 Trochanteric bursitis, left hip: Secondary | ICD-10-CM | POA: Diagnosis not present

## 2021-06-24 DIAGNOSIS — Z833 Family history of diabetes mellitus: Secondary | ICD-10-CM

## 2021-06-24 DIAGNOSIS — F419 Anxiety disorder, unspecified: Secondary | ICD-10-CM | POA: Diagnosis present

## 2021-06-24 DIAGNOSIS — E7800 Pure hypercholesterolemia, unspecified: Secondary | ICD-10-CM | POA: Diagnosis not present

## 2021-06-24 LAB — CBC
HCT: 45.8 % (ref 36.0–46.0)
Hemoglobin: 15 g/dL (ref 12.0–15.0)
MCH: 29.2 pg (ref 26.0–34.0)
MCHC: 32.8 g/dL (ref 30.0–36.0)
MCV: 89.3 fL (ref 80.0–100.0)
Platelets: 307 10*3/uL (ref 150–400)
RBC: 5.13 MIL/uL — ABNORMAL HIGH (ref 3.87–5.11)
RDW: 14.3 % (ref 11.5–15.5)
WBC: 13.3 10*3/uL — ABNORMAL HIGH (ref 4.0–10.5)
nRBC: 0 % (ref 0.0–0.2)

## 2021-06-24 LAB — BASIC METABOLIC PANEL
Anion gap: 10 (ref 5–15)
BUN: 12 mg/dL (ref 8–23)
CO2: 23 mmol/L (ref 22–32)
Calcium: 9.7 mg/dL (ref 8.9–10.3)
Chloride: 103 mmol/L (ref 98–111)
Creatinine, Ser: 0.71 mg/dL (ref 0.44–1.00)
GFR, Estimated: >60 mL/min (ref >60)
Glucose, Bld: 173 mg/dL — ABNORMAL HIGH (ref 70–99)
Potassium: 3.6 mmol/L (ref 3.5–5.1)
Sodium: 136 mmol/L (ref 135–145)

## 2021-06-24 LAB — TROPONIN I (HIGH SENSITIVITY): Troponin I (High Sensitivity): 5 ng/L (ref <18)

## 2021-06-24 NOTE — Telephone Encounter (Signed)
Called patient to advise that Malachy Mood was not here today and to see if I could assist. She states that yesterday she began having a aching in her chest, she states that it has been going on since yesterday and into today. She took a nitroglycerin yesterday with no relief, and took another one today with 2 aspirins, with no relief. She states that her BP is 151/62 HR 58. She denied SOB but I could hear her walking from one room to another and she was very SOB on the phone by the end of the conversation. She denies swelling, or weight gain. I asked if this felt similar to her visit back in February when she had her stents placed, and patient verbalized if she was honest yes. She was trying to avoid going to the ED, but will if she has too. I did advise with these symptoms, cardiac history, and similar stories I would recommend to go to ED. Patient verbalized, she will have someone drive her as she does not want to call 911.  Patient verbalized understanding, will route to MD and nurse as Juluis Rainier.

## 2021-06-24 NOTE — Telephone Encounter (Signed)
Pt is calling requesting Malachy Mood five her a call back

## 2021-06-24 NOTE — ED Provider Notes (Signed)
Emergency Medicine Provider Triage Evaluation Note  Megan [name] , a 75 y.o. female  was evaluated in triage.  Pt complains of chest discomfort beginning yesterday.  Some shortness of breath with exertion.  Took 1 nitroglycerin without effect earlier today.  She has taken two 325 mg aspirins today.  Review of Systems  Positive: Chest aching in the left chest, exertional shortness of breath. Negative: Dizziness, syncope, shortness of breath at rest, lower extremity edema  Physical Exam  BP (!) 146/61 (BP Location: Left Arm)   Pulse 100   Temp 97.6 F (36.4 C) (Oral)   Resp 17   SpO2 98%  Gen:   Awake, no distress   Resp:  Normal effort  MSK:   Moves extremities without difficulty  Other:    Medical Decision Making  Medically screening exam initiated at 7:23 PM.  Appropriate orders placed.  Megan [name] was informed that the remainder of the evaluation will be completed by another provider, this initial triage assessment does not replace that evaluation, and the importance of remaining in the ED until their evaluation is complete.     Megan Bender, PA-C 06/24/21 [id number]    Drenda Freeze, MD 06/24/21 [id number]

## 2021-06-24 NOTE — ED Triage Notes (Signed)
Chest pain - described as aching - since yesterday afternoon, denies any nausea, vomiting, dizziness, arm and jaw pain.  Hx of 2 cardiac stents (placed in Feb 2022) and 1 abdominal aorta.

## 2021-06-25 ENCOUNTER — Telehealth: Payer: Self-pay | Admitting: Cardiology

## 2021-06-25 ENCOUNTER — Other Ambulatory Visit: Payer: Self-pay

## 2021-06-25 ENCOUNTER — Encounter (HOSPITAL_COMMUNITY): Payer: Self-pay | Admitting: Internal Medicine

## 2021-06-25 ENCOUNTER — Observation Stay (HOSPITAL_COMMUNITY): Payer: Medicare Other

## 2021-06-25 DIAGNOSIS — R079 Chest pain, unspecified: Secondary | ICD-10-CM | POA: Diagnosis present

## 2021-06-25 DIAGNOSIS — E041 Nontoxic single thyroid nodule: Secondary | ICD-10-CM | POA: Diagnosis not present

## 2021-06-25 DIAGNOSIS — I209 Angina pectoris, unspecified: Secondary | ICD-10-CM | POA: Diagnosis present

## 2021-06-25 DIAGNOSIS — R0602 Shortness of breath: Secondary | ICD-10-CM | POA: Diagnosis not present

## 2021-06-25 LAB — LIPID PANEL
Cholesterol: 142 mg/dL (ref 0–200)
HDL: 49 mg/dL (ref >40)
LDL Cholesterol: 73 mg/dL (ref 0–99)
Total CHOL/HDL Ratio: 2.9 RATIO
Triglycerides: 100 mg/dL (ref <150)
VLDL: 20 mg/dL (ref 0–40)

## 2021-06-25 LAB — CBC
HCT: 43.8 % (ref 36.0–46.0)
Hemoglobin: 14.5 g/dL (ref 12.0–15.0)
MCH: 29.5 pg (ref 26.0–34.0)
MCHC: 33.1 g/dL (ref 30.0–36.0)
MCV: 89.2 fL (ref 80.0–100.0)
Platelets: 283 10*3/uL (ref 150–400)
RBC: 4.91 MIL/uL (ref 3.87–5.11)
RDW: 14.4 % (ref 11.5–15.5)
WBC: 13.2 10*3/uL — ABNORMAL HIGH (ref 4.0–10.5)
nRBC: 0 % (ref 0.0–0.2)

## 2021-06-25 LAB — TROPONIN I (HIGH SENSITIVITY)
Troponin I (High Sensitivity): 6 ng/L (ref <18)
Troponin I (High Sensitivity): 6 ng/L (ref <18)
Troponin I (High Sensitivity): 6 ng/L (ref <18)
Troponin I (High Sensitivity): 6 ng/L (ref <18)

## 2021-06-25 LAB — D-DIMER, QUANTITATIVE: D-Dimer, Quant: 3 ug/mL-FEU — ABNORMAL HIGH (ref 0.00–0.50)

## 2021-06-25 LAB — SARS CORONAVIRUS 2 (TAT 6-24 HRS): SARS Coronavirus 2: NEGATIVE

## 2021-06-25 LAB — CREATININE, SERUM
Creatinine, Ser: 0.59 mg/dL (ref 0.44–1.00)
GFR, Estimated: >60 mL/min (ref >60)

## 2021-06-25 MED ORDER — ALUM & MAG HYDROXIDE-SIMETH 200-200-20 MG/5ML PO SUSP
30.0000 mL | Freq: Once | ORAL | Status: AC
  Administered 2021-06-25: 30 mL via ORAL
  Filled 2021-06-25: qty 30

## 2021-06-25 MED ORDER — ASPIRIN EC 81 MG PO TBEC
81.0000 mg | DELAYED_RELEASE_TABLET | Freq: Every day | ORAL | Status: DC
  Administered 2021-06-25 – 2021-06-27 (×3): 81 mg via ORAL
  Filled 2021-06-25 (×5): qty 1

## 2021-06-25 MED ORDER — OXYCODONE-ACETAMINOPHEN 10-325 MG PO TABS: 1.0000 | ORAL_TABLET | Freq: Four times a day (QID) | ORAL | Status: DC | PRN

## 2021-06-25 MED ORDER — PRASUGREL HCL 5 MG PO TABS
5.0000 mg | ORAL_TABLET | Freq: Every day | ORAL | Status: DC
  Administered 2021-06-25 – 2021-06-27 (×3): 5 mg via ORAL
  Filled 2021-06-25 (×3): qty 1

## 2021-06-25 MED ORDER — LIDOCAINE VISCOUS HCL 2 % MT SOLN
15.0000 mL | Freq: Once | OROMUCOSAL | Status: AC
  Administered 2021-06-25: 15 mL via ORAL
  Filled 2021-06-25: qty 15

## 2021-06-25 MED ORDER — NITROGLYCERIN 2 % TD OINT
0.5000 [in_us] | TOPICAL_OINTMENT | Freq: Once | TRANSDERMAL | Status: AC
  Administered 2021-06-25: 0.5 [in_us] via TOPICAL
  Filled 2021-06-25: qty 1

## 2021-06-25 MED ORDER — LOSARTAN POTASSIUM-HCTZ 100-25 MG PO TABS: 0.5000 | ORAL_TABLET | Freq: Every day | ORAL | Status: DC

## 2021-06-25 MED ORDER — ATORVASTATIN CALCIUM 80 MG PO TABS
80.0000 mg | ORAL_TABLET | Freq: Every day | ORAL | Status: DC
  Administered 2021-06-25 – 2021-06-27 (×3): 80 mg via ORAL
  Filled 2021-06-25 (×2): qty 1
  Filled 2021-06-25: qty 2

## 2021-06-25 MED ORDER — OXYCODONE-ACETAMINOPHEN 5-325 MG PO TABS
1.0000 | ORAL_TABLET | Freq: Four times a day (QID) | ORAL | Status: DC | PRN
  Administered 2021-06-25 – 2021-06-27 (×5): 1 via ORAL
  Filled 2021-06-25 (×5): qty 1

## 2021-06-25 MED ORDER — ISOSORBIDE MONONITRATE ER 30 MG PO TB24
15.0000 mg | ORAL_TABLET | Freq: Every day | ORAL | Status: DC
  Administered 2021-06-25 – 2021-06-27 (×3): 15 mg via ORAL
  Filled 2021-06-25 (×3): qty 1

## 2021-06-25 MED ORDER — OXYCODONE HCL 5 MG PO TABS
5.0000 mg | ORAL_TABLET | Freq: Four times a day (QID) | ORAL | Status: DC | PRN
  Administered 2021-06-25 – 2021-06-27 (×5): 5 mg via ORAL
  Filled 2021-06-25 (×5): qty 1

## 2021-06-25 MED ORDER — PANTOPRAZOLE SODIUM 40 MG PO TBEC
40.0000 mg | DELAYED_RELEASE_TABLET | Freq: Every day | ORAL | Status: DC
Start: 2021-06-25 — End: 2021-06-27
  Administered 2021-06-25 – 2021-06-27 (×3): 40 mg via ORAL
  Filled 2021-06-25 (×3): qty 1

## 2021-06-25 MED ORDER — IOHEXOL 350 MG/ML SOLN
50.0000 mL | Freq: Once | INTRAVENOUS | Status: AC | PRN
  Administered 2021-06-25: 50 mL via INTRAVENOUS

## 2021-06-25 MED ORDER — LOSARTAN POTASSIUM 50 MG PO TABS
50.0000 mg | ORAL_TABLET | Freq: Every day | ORAL | Status: DC
  Administered 2021-06-25 – 2021-06-27 (×3): 50 mg via ORAL
  Filled 2021-06-25 (×3): qty 1

## 2021-06-25 MED ORDER — HEPARIN SODIUM (PORCINE) 5000 UNIT/ML IJ SOLN
5000.0000 [IU] | Freq: Three times a day (TID) | INTRAMUSCULAR | Status: DC
  Administered 2021-06-25 – 2021-06-27 (×6): 5000 [IU] via SUBCUTANEOUS
  Filled 2021-06-25 (×6): qty 1

## 2021-06-25 MED ORDER — ONDANSETRON HCL 4 MG/2ML IJ SOLN: 4.0000 mg | Freq: Four times a day (QID) | INTRAMUSCULAR | Status: DC | PRN

## 2021-06-25 MED ORDER — ACETAMINOPHEN 325 MG PO TABS: 650.0000 mg | ORAL_TABLET | ORAL | Status: DC | PRN

## 2021-06-25 MED ORDER — METOPROLOL TARTRATE 25 MG PO TABS
25.0000 mg | ORAL_TABLET | Freq: Two times a day (BID) | ORAL | Status: DC
  Administered 2021-06-25 – 2021-06-27 (×5): 25 mg via ORAL
  Filled 2021-06-25 (×5): qty 1

## 2021-06-25 MED ORDER — HYDROCHLOROTHIAZIDE 12.5 MG PO CAPS
12.5000 mg | ORAL_CAPSULE | Freq: Every day | ORAL | Status: DC
  Administered 2021-06-25 – 2021-06-27 (×3): 12.5 mg via ORAL
  Filled 2021-06-25 (×3): qty 1

## 2021-06-25 MED ORDER — GABAPENTIN 300 MG PO CAPS
300.0000 mg | ORAL_CAPSULE | Freq: Two times a day (BID) | ORAL | Status: DC
  Administered 2021-06-25 – 2021-06-27 (×5): 300 mg via ORAL
  Filled 2021-06-25 (×5): qty 1

## 2021-06-25 MED ORDER — HYDROXYZINE HCL 25 MG PO TABS
25.0000 mg | ORAL_TABLET | Freq: Three times a day (TID) | ORAL | Status: DC | PRN
  Administered 2021-06-26: 25 mg via ORAL
  Filled 2021-06-25: qty 1

## 2021-06-25 NOTE — ED Notes (Signed)
Pt requesting nitro paste. PA notified. See new order.

## 2021-06-25 NOTE — ED Provider Notes (Signed)
[facility] EMERGENCY DEPARTMENT Provider Note   CSN: [id number] Arrival date & time: 06/24/21  1837     History Chief Complaint  Patient presents with   Chest Pain    Megan [name] is a 75 y.o. female.  75 y.o. year-old female with history of  CAD s/p CABG (1995, LIMA-LAD and sequential SVG-rPDA-rPL), renal artery stenosis s/p stent 2008, AAA s/p repair/stent 2009, s/p iliac stent 2016, HTN, HLD, COPD, fibromyalgia presents to the ED for c/o chest pain. Describes the pain as her heart "aching". This has been fairly constant and waxing/waning since Tuesday (7/19). Denies worsening with exertion. No radiation of the pain. Has not had associated N/V, arm paresthesias, extremity weakness, neck or jaw pain, back or abdominal pain, syncope/near syncope, fever, N/V. States that her symptoms feel prior to her past episodes of ACS, most recently in February when she underwent PCI to her SVG-rPDA-rPL graft. On ASA and Effient. Recently treated for bronchitis by her PCP with a Z-pack and guaifenesin syrup.    Chest Pain     Past Medical History:  Diagnosis Date   AAA (abdominal aortic aneurysm) ([place])    a. s/p stent grafting in 2009.   Adenomatous colon polyp 05/2001   Adenomatous duodenal polyp    Allergy    Anginal pain (HCC)    COPD (chronic obstructive pulmonary disease) ([place])    pt denies COPD   Coronary artery disease    a. s/p CABG in 1995;  b. 05/2013 Neg MV, EF 82%;  c. 06/2013 Cath: LM nl, LAD 40-50p, LCX nl, RCA 80p/128m, VG->RCA->PDA->PLSA min irregs, LIMA->LAD atretic, vigorous LV fxn.   Diabetes mellitus without complication (HCC)    history of, resolved. 2017   Diverticulosis    Eczema, dyshidrotic    Eczema, dyshidrotic 1986   Dr. Mathis Fare   Emphysema of lung [facility])    Fatty liver    Fibromyalgia    GERD (gastroesophageal reflux disease)    Hyperlipidemia    Hypertension    Migraine    Myocardial infarction [facility])    Osteoarthritis     Osteoporosis    unsure, having a bone scan soon   Peripheral arterial disease ([place])    left leg diagnosed by Dr Mare Ferrari   Renal artery stenosis [facility]    a. 2008 s/p PTA    Patient Active Problem List   Diagnosis Date Noted   History of compression fracture of spine - lumbar 06/19/2021   History of lumbosacral spine surgery 03/03/2021   NSTEMI (non-ST elevated myocardial infarction) ([place]) 2022 01/31/2021   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/26/2021   Diarrhea 01/06/2021   Neuropathic pain 12/05/2020   Gait instability 09/09/2020   Lumbar stenosis without neurogenic claudication 02/26/2020   COPD with chronic bronchitis ([place]) 09/28/2018   Essential (primary) hypertension 07/10/2018   IFG (impaired fasting glucose) 11/10/2017   DJD (degenerative joint disease), lumbosacral 11/10/2017   Fibromyalgia 11/10/2017   Dyshidrotic eczema 11/10/2017   GERD (gastroesophageal reflux disease) 11/10/2017   Osteopenia 11/10/2017   Does use hearing aid 11/10/2017   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   Fatty liver 11/10/2017   Coronary artery disease    Peripheral vascular disease with claudication ([place])    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   History of abdominal aortic aneurysm repair 05/18/2011   History of renal artery stenosis 05/18/2011   Hyperlipidemia LDL goal <70     Past Surgical History:  Procedure Laterality Date   ABDOMINAL AORTAGRAM N/A 03/06/2014  Procedure: ABDOMINAL AORTAGRAM;  Surgeon: Rosetta Posner, MD;  Location: [facility] CATH LAB;  Service: Cardiovascular;  Laterality: N/A;   ABDOMINAL AORTIC ANEURYSM REPAIR  2009   stenting   ABDOMINAL HYSTERECTOMY     Total, 1992   BREAST BIOPSY     CARDIAC CATHETERIZATION     CARDIOVASCULAR STRESS TEST  11/11/2009   normal study   CARPAL TUNNEL RELEASE  08/1993   left wrist   CARPAL TUNNEL RELEASE  01/1997   right   CATARACT EXTRACTION, BILATERAL  2021   CORONARY ARTERY BYPASS GRAFT  07/1994   Triple by Dr Ceasar Mons   CORONARY  STENT INTERVENTION N/A 02/02/2021   Procedure: CORONARY STENT INTERVENTION;  Surgeon: Nelva Bush, MD;  Location: [facility] CV LAB;  Service: Cardiovascular;  Laterality: N/A;   dental implants     EYE SURGERY  03/2020   cataract   HAND SURGERY Right 09/2002   Right hand pulley release   LEFT HEART CATH AND CORS/GRAFTS ANGIOGRAPHY N/A 02/02/2021   Procedure: LEFT HEART CATH AND CORS/GRAFTS ANGIOGRAPHY;  Surgeon: Nelva Bush, MD;  Location: [facility] CV LAB;  Service: Cardiovascular;  Laterality: N/A;   LEFT HEART CATHETERIZATION WITH CORONARY/GRAFT ANGIOGRAM N/A 06/11/2013   Procedure: LEFT HEART CATHETERIZATION WITH [name];  Surgeon: Thayer Headings, MD;  Location: [facility] CATH LAB;  Service: Cardiovascular;  Laterality: N/A;   MULTIPLE TOOTH EXTRACTIONS  2000   All upper teeth.  Has full dneture.    RENAL ARTERY STENT  2008   SHOULDER ARTHROSCOPY WITH ROTATOR CUFF REPAIR Right 09/27/2018   Procedure: Right shoulder mini open rotator cuff repair;  Surgeon: Susa Day, MD;  Location: [facility] ORS;  Service: Orthopedics;  Laterality: Right;  90 mins   thyroid cyst apiration  05/1997 and 07/1998   TONSILECTOMY, ADENOIDECTOMY, BILATERAL MYRINGOTOMY AND TUBES  1989   TONSILLECTOMY     TOTAL ABDOMINAL HYSTERECTOMY  1992     OB History   No obstetric history on file.     Family History  Problem Relation Age of Onset   Heart disease Mother    Heart attack Mother    Heart disease Father        before age 70   Heart attack Father    Heart disease Sister        before age 43   Diabetes Sister    Hyperlipidemia Sister    Hypertension Sister    Heart attack Sister    Liver cancer Sister    Breast cancer Sister    Heart disease Brother    Diabetes Brother    Hyperlipidemia Brother    Hypertension Brother    Heart attack Brother    AAA (abdominal aortic aneurysm) Brother    Colon cancer Paternal Grandmother    Heart disease Brother    Colon cancer Cousin         first cousin on father's side   Pancreatic cancer Neg Hx    Rectal cancer Neg Hx    Stomach cancer Neg Hx    Esophageal cancer Neg Hx     Social History   Tobacco Use   Smoking status: Former    Packs/day: 0.00    Years: 37.00    Pack years: 0.00    Types: Cigarettes    Quit date: 12/07/1995    Years since quitting: 25.5   Smokeless tobacco: Never  Vaping Use   Vaping Use: Never used  Substance Use Topics   Alcohol use:  Yes    Alcohol/week: 6.0 - 10.0 standard drinks    Types: 6 - 10 Glasses of wine per week    Comment: 2-3 glasses daily   Drug use: No    Home Medications Prior to Admission medications   Medication Sig Start Date End Date Taking? Authorizing Provider  Accu-Chek FastClix Lancets MISC Check blood sugar once daily PRN 07/30/19   [name], MD  albuterol (PROVENTIL HFA;VENTOLIN HFA) 108 (90 Base) MCG/ACT inhaler Inhale 2 puffs into the lungs every 4 (four) hours as needed for wheezing or shortness of breath. 11/13/18   [name], MD  ALPRAZolam Duanne Moron) 0.25 MG tablet Take 1 tablet (0.25 mg total) by mouth at bedtime as needed for sleep. 09/15/20   Martinique, Peter M, MD  aspirin EC 81 MG tablet Take 1 tablet (81 mg total) by mouth daily. 05/09/20   Martinique, Peter M, MD  atorvastatin (LIPITOR) 80 MG tablet TAKE 1 TABLET BY MOUTH EVERY DAY Patient taking differently: Take 80 mg by mouth daily. 08/18/20   Martinique, Peter M, MD  augmented betamethasone dipropionate (DIPROLENE-AF) 0.05 % cream Apply 1 application topically daily as needed (rash). For hands 10/01/15   [provider]  azithromycin (ZITHROMAX) 250 MG tablet Take 2 tabs today, then 1 tab daily for 4 days 06/19/21   [name], MD  B Complex-Biotin-FA (B-100 COMPLEX PO) Take 1 tablet by mouth at bedtime.     [provider]  Budeson-Glycopyrrol-Formoterol (BREZTRI AEROSPHERE) 160-9-4.8 MCG/ACT AERO Inhale 2 puffs into the lungs in the morning and at bedtime. Patient taking  differently: Inhale 1 puff into the lungs daily as needed (shortness of breath). 02/27/20   [name], MD  ciclopirox (LOPROX) 0.77 % cream Apply 1 application topically daily as needed (hands fungus).     [provider]  CORDRAN 4 MCG/SQCM TAPE Apply 1 each topically daily as needed (Rash).  05/17/19   [provider]  cycloSPORINE (RESTASIS) 0.05 % ophthalmic emulsion Place 1 drop into both eyes 2 (two) times daily.     [provider]  diclofenac (VOLTAREN) 75 MG EC tablet Take 1 tablet (75 mg total) by mouth 2 (two) times daily as needed. 03/16/21   [name], MD  gabapentin (NEURONTIN) 300 MG capsule Take 1-2 capsules (300-600 mg total) by mouth 3 (three) times daily. Patient taking differently: Take 300 mg by mouth 2 (two) times daily. 11/15/19   Lomax, Amy, NP  glucose blood (ACCU-CHEK SMARTVIEW) test strip CHECK BLOOD SUGAR ONCE DAILY AS NEEDED 01/17/20   [name], MD  guaiFENesin-codeine 100-10 MG/5ML syrup Take 5 mLs by mouth every 6 (six) hours as needed for cough. 06/19/21   [name], MD  hydrOXYzine (ATARAX/VISTARIL) 25 MG tablet Take 1 tablet (25 mg total) by mouth 3 (three) times daily as needed. 03/09/21   Martinique, Peter M, MD  losartan-hydrochlorothiazide (HYZAAR) 100-25 MG tablet Take 0.5 tablets by mouth daily. 02/03/21   Cheryln Manly, NP  metoprolol tartrate (LOPRESSOR) 25 MG tablet Take 1 tablet (25 mg total) by mouth 2 (two) times daily. 02/18/21   Martinique, Peter M, MD  metoprolol tartrate (LOPRESSOR) 25 MG tablet TAKE 1 TABLET (25 MG TOTAL) BY MOUTH TWO TIMES DAILY. 02/03/21 02/03/22  Cheryln Manly, NP  Multiple Vitamins-Minerals (MULTIVITAMIN WITH MINERALS) tablet Take 1 tablet by mouth at bedtime.    [provider]  nitroGLYCERIN (NITROSTAT) 0.4 MG SL tablet Place 1 tablet (0.4 mg  total) under the tongue every 5 (five) minutes as needed for chest pain. 01/09/20   Martinique, Peter M, MD  nystatin (MYCOSTATIN) 100000 UNIT/ML  suspension Take 5 mLs (500,000 Units total) by mouth 4 (four) times daily. 01/19/21   [name], MD  omeprazole (PRILOSEC) 40 MG capsule Take 1 capsule (40 mg total) by mouth in the morning and at bedtime. 05/11/21   Ladene Artist, MD  ondansetron (ZOFRAN) 8 MG tablet Take by mouth every 8 (eight) hours as needed for nausea or vomiting.    [provider]  oxyCODONE-acetaminophen (PERCOCET) 10-325 MG tablet Take 1 tablet by mouth every 6 (six) hours as needed. 04/08/21   [provider]  potassium chloride (KLOR-CON) 10 MEQ tablet Take 1 tablet (10 mEq total) by mouth 2 (two) times daily. 05/26/21   Martinique, Peter M, MD  prasugrel (EFFIENT) 10 MG TABS tablet Take 1/2 tablet ( 5 Mg ) daily 03/09/21   Martinique, Peter M, MD  saccharomyces boulardii (FLORASTOR) 250 MG capsule Take 1 capsule (250 mg total) by mouth 2 (two) times daily. Any probiotic available is okay 01/13/21   Rai, Vernelle Emerald, MD  simethicone (MYLICON) 80 MG chewable tablet Chew 1 tablet (80 mg total) by mouth 4 (four) times daily as needed for flatulence (bloating). also available OTC 01/13/21   Rai, Ripudeep K, MD  ticagrelor (BRILINTA) 90 MG TABS tablet TAKE 1 TABLET (90 MG TOTAL) BY MOUTH TWO TIMES DAILY. 02/03/21 02/03/22  Cheryln Manly, NP  torsemide (DEMADEX) 20 MG tablet Take 0.5 tablets (10 mg total) by mouth daily as needed (fluid). 01/13/21   Rai, Vernelle Emerald, MD  tretinoin (RETIN-A) 0.1 % cream Apply 1 application topically every other day. In the evening    [provider]  VITAMIN D, ERGOCALCIFEROL, PO Take 1,000 Units by mouth every evening.     [provider]    Allergies    Anticoagulant compound, Bextra [valdecoxib], Hydrocod polst-cpm polst er, Lipitor [atorvastatin], Mevacor [lovastatin], Plavix [clopidogrel bisulfate], Zocor [simvastatin], Doxycycline, Metoprolol, Morphine and related, Codeine, and Lisinopril  Review of Systems   Review of Systems  Cardiovascular:  Positive for chest  pain.  Ten systems reviewed and are negative for acute change, except as noted in the HPI.    Physical Exam Updated Vital Signs BP (!) 160/55 (BP Location: Left Arm)   Pulse 76   Temp 98.2 F (36.8 C) (Oral)   Resp 17   Ht 5' (1.524 m)   Wt 53.5 kg   SpO2 97%   BMI 23.05 kg/m   Physical Exam Vitals and nursing note reviewed.  Constitutional:      General: She is not in acute distress.    Appearance: She is well-developed. She is not diaphoretic.     Comments: Nontoxic appearing and in NAD  HENT:     Head: Normocephalic and atraumatic.  Eyes:     General: No scleral icterus.    Conjunctiva/sclera: Conjunctivae normal.  Cardiovascular:     Rate and Rhythm: Normal rate and regular rhythm.     Pulses: Normal pulses.  Pulmonary:     Effort: Pulmonary effort is normal. No respiratory distress.     Breath sounds: No stridor. No wheezing.     Comments: Respirations even and unlabored. Lungs CTAB. Musculoskeletal:        General: Normal range of motion.     Cervical back: Normal range of motion.     Comments:  BLE edema  Skin:  General: Skin is warm and dry.     Coloration: Skin is not pale.     Findings: No erythema or rash.  Neurological:     Mental Status: She is alert and oriented to person, place, and time.     Coordination: Coordination normal.  Psychiatric:        Behavior: Behavior normal.    ED Results / Procedures / Treatments   Labs (all labs ordered are listed, but only abnormal results are displayed) Labs Reviewed  BASIC METABOLIC PANEL - Abnormal; Notable for the following components:      Result Value   Glucose, Bld 173 (*)    All other components within normal limits  CBC - Abnormal; Notable for the following components:   WBC 13.3 (*)    RBC 5.13 (*)    All other components within normal limits  SARS CORONAVIRUS 2 (TAT 6-24 HRS)  TROPONIN I (HIGH SENSITIVITY)  TROPONIN I (HIGH SENSITIVITY)  TROPONIN I (HIGH SENSITIVITY)    EKG EKG  Interpretation  Date/Time:  Wednesday June 24 2021 18:42:30 EDT Ventricular Rate:  101 PR Interval:  172 QRS Duration: 76 QT Interval:  360 QTC Calculation: 466 R Axis:   20 Text Interpretation: Sinus tachycardia Nonspecific ST abnormality Abnormal ECG Confirmed by Gerlene Fee [phone]) on 06/25/2021 1:26:10 AM  Radiology DG Chest 2 View  Result Date: 06/24/2021 CLINICAL DATA:  Chest pain EXAM: CHEST - 2 VIEW COMPARISON:  02/11/2021 FINDINGS: Post sternotomy changes. No consolidation or effusion. Mild atelectasis right base. Mitral annular calcification. Normal heart size. Aortic atherosclerosis. No pneumothorax. Incompletely visualized abdominal aortic stent and surgical hardware in the lumbar spine IMPRESSION: No active cardiopulmonary disease. Electronically Signed   By: Donavan Foil M.D.   On: 06/24/2021 20:03    Procedures Procedures   Medications Ordered in ED Medications  nitroGLYCERIN (NITROGLYN) 2 % ointment 0.5 inch (0.5 inches Topical Given 06/25/21 0236)    ED Course  I have reviewed the triage vital signs and the nursing notes.  Pertinent labs & imaging results that were available during my care of the patient were reviewed by me and considered in my medical decision making (see chart for details).  Clinical Course as of 06/25/21 0243  Thu Jun 25, 2021  0235 Spoke with Dr. Marcello Moores of [facility] who will admit. Requests consultation to cardiology given need for PCI in February.  [KH]  0254 Dr. Blossom Hoops of Cardiology aware of patient. Their service will consult later this AM. No additional recommendations at this time. [KH]    Clinical Course User Index [KH] Beverely Pace   MDM Rules/Calculators/A&P                           75 year old female presenting for c/o chest pain.  History of NSTEMI in September requiring PCI of prior SVG-rPDA-rPL graft.  States this pain feels the same.  Her EKG does not suggest acute ischemia; appears improved in comparison to March.  Troponin  has been negative x2.  Hemodynamically stable.  She will be admitted by the hospitalist service with inpatient cardiology consultation.   Final Clinical Impression(s) / ED Diagnoses Final diagnoses:  Nonspecific chest pain    Rx / DC Orders ED Discharge Orders     None        Lovely, Kerins, PA-C 06/25/21 0245    Maudie Flakes, MD 06/25/21 [phone]

## 2021-06-25 NOTE — Telephone Encounter (Signed)
PJ is at the hosp this week. Will forward and call cardmaster for [facility].  Tried to call pt, LMVM. Informed cardmaster. Dr Martinique is off today.

## 2021-06-25 NOTE — Plan of Care (Signed)
  Problem: Education: Goal: Knowledge of General Education information will improve Description Including pain rating scale, medication(s)/side effects and non-pharmacologic comfort measures Outcome: Progressing   

## 2021-06-25 NOTE — Care Management Obs Status (Signed)
[place] NOTIFICATION   Patient Details  Name: Megan [name] MRN: [id number] Date of Birth: 07-25-1946   Medicare Observation Status Notification Given:  Yes    Bethena Roys, RN 06/25/2021, 4:57 PM

## 2021-06-25 NOTE — Consult Note (Addendum)
Cardiology Consultation:   Patient ID: Megan [name] MRN: [id number]; DOB: 02-07-1946  Admit date: 06/24/2021 Date of Consult: 06/25/2021  PCP:  [name], MD   [facility] Providers Cardiologist:  [name], MD     Patient Profile:   Megan [name] is a 75 y.o. female with a hx of CAD s/p CABG '95 with subsequent NSTEMI and stenting to SVG-RCA, RAS with stenting '08, AAA with stent graft '09, s/p left iliac stenting, HTN and HLD who is being seen 06/25/2021 for the evaluation of chest pain at the request of Dr. Nevada Crane.  History of Present Illness:   Ms. [name] is a 75 yo female with PMH noted above. She has been followed by Dr. Martinique as an outpatient.  She is status post CABG and 95 with abdominal aortic aneurysm status post graft repair.  Also had stenting of the left iliac artery and renal artery stenting for stenosis.  She was hospitalized in July 2014 and had a cardiac catheterization which showed patent grafts with normal EF.  Hospitalized again in 2015 and underwent exercise Myoview which showed no ischemia.  Most recent hospitalization 2/26 to 02/03/2021 with NSTEMI.  Troponin peaked at 1085.  AG showed significant inferior lateral T wave inversion.  Underwent cardiac cath showing patent LIMA to LAD with occluded native RCA, severe sequential lesions in the SVG to RCA.  This was treated with overlapping DES x2.  Her procedure was complicated by left forearm hematoma.  She was started on DAPT with aspirin/Brilinta.  Was admitted about a week later for recurrent left-sided chest pain.  Enzymes were negative.  She was seen in follow-up with Dr. Martinique and complained of increasing shortness of breath which was felt to be related to Brilinta.  She was switched to Plavix but had an allergic reaction with rash and hives.  Was then switched to prasugrel.  At last office visit on 5/16 she was doing well without significant complaints.  She presented to the ED on 7/21 with  complaints of chest pain over the past 2 days. Developed an aching sensation in the left side of her chest on Tuesday afternoon. Tried a SL nitro without relief. Also tried several reflux medications without much either. Symptoms lingered into the night and next morning she woke up with ongoing symptoms. Called the office and was instructed to present to the ED.   Labs in the ED showed stable electrolytes, high-sensitivity troponin 5>>6>>6, WBC 13.3, hemoglobin 15. CXR negative. She continued to have persistent chest pain and was admitted to IM for further work up.   She is no longer having pain at present time of interview. States symptoms are exactly like what she experienced in the past with her previous cardia events.    Past Medical History:  Diagnosis Date   AAA (abdominal aortic aneurysm) ([place])    a. s/p stent grafting in 2009.   Adenomatous colon polyp 05/2001   Adenomatous duodenal polyp    Allergy    Anginal pain (HCC)    COPD (chronic obstructive pulmonary disease) ([place])    pt denies COPD   Coronary artery disease    a. s/p CABG in 1995;  b. 05/2013 Neg MV, EF 82%;  c. 06/2013 Cath: LM nl, LAD 40-50p, LCX nl, RCA 80p/145m, VG->RCA->PDA->PLSA min irregs, LIMA->LAD atretic, vigorous LV fxn.   Diabetes mellitus without complication (HCC)    history of, resolved. 2017   Diverticulosis    Eczema, dyshidrotic    Eczema, dyshidrotic  1986   Dr. Mathis Fare   Emphysema of lung [facility])    Fatty liver    Fibromyalgia    GERD (gastroesophageal reflux disease)    Hyperlipidemia    Hypertension    Migraine    Myocardial infarction [facility])    Osteoarthritis    Osteoporosis    unsure, having a bone scan soon   Peripheral arterial disease ([place])    left leg diagnosed by Dr Mare Ferrari   Renal artery stenosis [facility])    a. 2008 s/p PTA    Past Surgical History:  Procedure Laterality Date   ABDOMINAL AORTAGRAM N/A 03/06/2014   Procedure: ABDOMINAL AORTAGRAM;  Surgeon: Rosetta Posner, MD;   Location: [facility] CATH LAB;  Service: Cardiovascular;  Laterality: N/A;   ABDOMINAL AORTIC ANEURYSM REPAIR  2009   stenting   ABDOMINAL HYSTERECTOMY     Total, 1992   BREAST BIOPSY     CARDIAC CATHETERIZATION     CARDIOVASCULAR STRESS TEST  11/11/2009   normal study   CARPAL TUNNEL RELEASE  08/1993   left wrist   CARPAL TUNNEL RELEASE  01/1997   right   CATARACT EXTRACTION, BILATERAL  2021   CORONARY ARTERY BYPASS GRAFT  07/1994   Triple by Dr Ceasar Mons   CORONARY STENT INTERVENTION N/A 02/02/2021   Procedure: CORONARY STENT INTERVENTION;  Surgeon: Nelva Bush, MD;  Location: [place] CV LAB;  Service: Cardiovascular;  Laterality: N/A;   dental implants     EYE SURGERY  03/2020   cataract   HAND SURGERY Right 09/2002   Right hand pulley release   LEFT HEART CATH AND CORS/GRAFTS ANGIOGRAPHY N/A 02/02/2021   Procedure: LEFT HEART CATH AND CORS/GRAFTS ANGIOGRAPHY;  Surgeon: Nelva Bush, MD;  Location: [place] CV LAB;  Service: Cardiovascular;  Laterality: N/A;   LEFT HEART CATHETERIZATION WITH CORONARY/GRAFT ANGIOGRAM N/A 06/11/2013   Procedure: LEFT HEART CATHETERIZATION WITH [name];  Surgeon: Thayer Headings, MD;  Location: [facility] CATH LAB;  Service: Cardiovascular;  Laterality: N/A;   MULTIPLE TOOTH EXTRACTIONS  2000   All upper teeth.  Has full dneture.    RENAL ARTERY STENT  2008   SHOULDER ARTHROSCOPY WITH ROTATOR CUFF REPAIR Right 09/27/2018   Procedure: Right shoulder mini open rotator cuff repair;  Surgeon: Susa Day, MD;  Location: [facility] ORS;  Service: Orthopedics;  Laterality: Right;  90 mins   thyroid cyst apiration  05/1997 and 07/1998   TONSILECTOMY, ADENOIDECTOMY, BILATERAL MYRINGOTOMY AND TUBES  1989   TONSILLECTOMY     TOTAL ABDOMINAL HYSTERECTOMY  1992     Home Medications:  Prior to Admission medications   Medication Sig Start Date End Date Taking? Authorizing Provider  albuterol (PROVENTIL HFA;VENTOLIN HFA) 108 (90 Base) MCG/ACT inhaler  Inhale 2 puffs into the lungs every 4 (four) hours as needed for wheezing or shortness of breath. 11/13/18  Yes [name], MD  ALPRAZolam Duanne Moron) 0.25 MG tablet Take 1 tablet (0.25 mg total) by mouth at bedtime as needed for sleep. 09/15/20  Yes Martinique, Peter M, MD  aspirin EC 81 MG tablet Take 1 tablet (81 mg total) by mouth daily. 05/09/20  Yes Martinique, Peter M, MD  atorvastatin (LIPITOR) 80 MG tablet TAKE 1 TABLET BY MOUTH EVERY DAY Patient taking differently: Take 80 mg by mouth daily. 08/18/20  Yes Martinique, Peter M, MD  B Complex-Biotin-FA (B-100 COMPLEX PO) Take 1 tablet by mouth at bedtime.    Yes [provider]  Budeson-Glycopyrrol-Formoterol (BREZTRI AEROSPHERE) 160-9-4.8  MCG/ACT AERO Inhale 2 puffs into the lungs in the morning and at bedtime. Patient taking differently: Inhale 1 puff into the lungs daily as needed (shortness of breath). 02/27/20  Yes [name], MD  CORDRAN 4 MCG/SQCM TAPE Apply 1 each topically daily as needed (Rash).  05/17/19  Yes [provider]  cycloSPORINE (RESTASIS) 0.05 % ophthalmic emulsion Place 1 drop into both eyes 2 (two) times daily.    Yes [provider]  diclofenac (VOLTAREN) 75 MG EC tablet Take 1 tablet (75 mg total) by mouth 2 (two) times daily as needed. Patient taking differently: Take 75 mg by mouth 2 (two) times daily as needed for mild pain. 03/16/21  Yes [name], MD  gabapentin (NEURONTIN) 300 MG capsule Take 1-2 capsules (300-600 mg total) by mouth 3 (three) times daily. Patient taking differently: Take 300 mg by mouth 2 (two) times daily. 11/15/19  Yes Lomax, Amy, NP  guaiFENesin-codeine 100-10 MG/5ML syrup Take 5 mLs by mouth every 6 (six) hours as needed for cough. 06/19/21  Yes [name], MD  hydrOXYzine (ATARAX/VISTARIL) 25 MG tablet Take 1 tablet (25 mg total) by mouth 3 (three) times daily as needed. Patient taking differently: Take 25 mg by mouth 3 (three) times daily as needed for itching.  03/09/21  Yes Martinique, Peter M, MD  losartan-hydrochlorothiazide (HYZAAR) 100-25 MG tablet Take 0.5 tablets by mouth daily. 02/03/21  Yes Reino Bellis B, NP  metoprolol tartrate (LOPRESSOR) 25 MG tablet Take 1 tablet (25 mg total) by mouth 2 (two) times daily. 02/18/21  Yes Martinique, Peter M, MD  Multiple Vitamins-Minerals (MULTIVITAMIN WITH MINERALS) tablet Take 1 tablet by mouth at bedtime.   Yes [provider]  nitroGLYCERIN (NITROSTAT) 0.4 MG SL tablet Place 1 tablet (0.4 mg total) under the tongue every 5 (five) minutes as needed for chest pain. 01/09/20  Yes Martinique, Peter M, MD  omeprazole (PRILOSEC) 40 MG capsule Take 1 capsule (40 mg total) by mouth in the morning and at bedtime. 05/11/21  Yes Ladene Artist, MD  ondansetron (ZOFRAN) 8 MG tablet Take by mouth every 8 (eight) hours as needed for nausea or vomiting.   Yes [provider]  oxyCODONE-acetaminophen (PERCOCET) 10-325 MG tablet Take 1 tablet by mouth in the morning and at bedtime. 04/08/21  Yes [provider]  potassium chloride (KLOR-CON) 10 MEQ tablet Take 1 tablet (10 mEq total) by mouth 2 (two) times daily. 05/26/21  Yes Martinique, Peter M, MD  prasugrel (EFFIENT) 10 MG TABS tablet Take 1/2 tablet ( 5 Mg ) daily Patient taking differently: Take 5 mg by mouth daily. 03/09/21  Yes Martinique, Peter M, MD  torsemide (DEMADEX) 20 MG tablet Take 0.5 tablets (10 mg total) by mouth daily as needed (fluid). 01/13/21  Yes Rai, Ripudeep K, MD  tretinoin (RETIN-A) 0.1 % cream Apply 1 application topically every other day. In the evening   Yes [provider]  VITAMIN D, ERGOCALCIFEROL, PO Take 1,000 Units by mouth every evening.    Yes [provider]  Accu-Chek FastClix Lancets MISC Check blood sugar once daily PRN Patient taking differently: 1 each by Other route See admin instructions. Check blood sugar once daily PRN 07/30/19   [name], MD  azithromycin (ZITHROMAX) 250 MG tablet Take 2 tabs today, then  1 tab daily for 4 days Patient not taking: No sig reported 06/19/21   [name], MD  glucose blood (ACCU-CHEK SMARTVIEW) test strip CHECK BLOOD  SUGAR ONCE DAILY AS NEEDED Patient not taking: Reported on 06/25/2021 01/17/20   [name], MD  metoprolol tartrate (LOPRESSOR) 25 MG tablet TAKE 1 TABLET (25 MG TOTAL) BY MOUTH TWO TIMES DAILY. Patient not taking: No sig reported 02/03/21 02/03/22  Reino Bellis B, NP  nystatin (MYCOSTATIN) 100000 UNIT/ML suspension Take 5 mLs (500,000 Units total) by mouth 4 (four) times daily. Patient not taking: Reported on 06/25/2021 01/19/21   [name], MD  saccharomyces boulardii (FLORASTOR) 250 MG capsule Take 1 capsule (250 mg total) by mouth 2 (two) times daily. Any probiotic available is okay Patient not taking: No sig reported 01/13/21   Rai, Vernelle Emerald, MD  simethicone (MYLICON) 80 MG chewable tablet Chew 1 tablet (80 mg total) by mouth 4 (four) times daily as needed for flatulence (bloating). also available OTC Patient not taking: Reported on 06/25/2021 01/13/21   Rai, Vernelle Emerald, MD  ticagrelor (BRILINTA) 90 MG TABS tablet TAKE 1 TABLET (90 MG TOTAL) BY MOUTH TWO TIMES DAILY. Patient not taking: No sig reported 02/03/21 02/03/22  Cheryln Manly, NP    Inpatient Medications: Scheduled Meds:  aspirin EC  81 mg Oral Daily   atorvastatin  80 mg Oral Daily   gabapentin  300 mg Oral BID   heparin  5,000 Units Subcutaneous Q8H   losartan  50 mg Oral Daily   And   hydrochlorothiazide  12.5 mg Oral Daily   metoprolol tartrate  25 mg Oral BID   pantoprazole  40 mg Oral Daily   prasugrel  5 mg Oral Daily   Continuous Infusions:  PRN Meds: acetaminophen, hydrOXYzine, ondansetron (ZOFRAN) IV, oxyCODONE-acetaminophen **AND** oxyCODONE  Allergies:    Allergies  Allergen Reactions   Anticoagulant Compound Itching and Other (See Comments)    Hands and feet tingle and itch.    Bextra [Valdecoxib] Other (See Comments)    Increased lft's     Hydrocod Polst-Cpm Polst Er Rash   Lipitor [Atorvastatin] Other (See Comments)    Leg weakness    Mevacor [Lovastatin] Other (See Comments)    Muscle weakness    Plavix [Clopidogrel Bisulfate] Itching and Other (See Comments)    Hands and feet tingle and itch   Zocor [Simvastatin] Other (See Comments)    Bones hurt    Doxycycline Diarrhea    N/v/d   Metoprolol Other (See Comments)    Hair loss   Morphine And Related Itching and Nausea Only   Codeine Itching and Rash    Hyper-active   Lisinopril Cough    Social History:   Social History   Socioeconomic History   Marital status: Married    Spouse name: Not on file   Number of children: Not on file   Years of education: Not on file   Highest education level: Not on file  Occupational History   Not on file  Tobacco Use   Smoking status: Former    Packs/day: 0.00    Years: 37.00    Pack years: 0.00    Types: Cigarettes    Quit date: 12/07/1995    Years since quitting: 25.5   Smokeless tobacco: Never  Vaping Use   Vaping Use: Never used  Substance and Sexual Activity   Alcohol use: Yes    Alcohol/week: 6.0 - 10.0 standard drinks    Types: 6 - 10 Glasses of wine per week    Comment: 2-3 glasses daily   Drug use: No   Sexual activity: Not Currently  Partners: Male  Other Topics Concern   Not on file  Social History Narrative   Lives at home with husband.  Is retired.  Education 4 yrs of college.  No children.   Caffiene 2 cups daily.    Social Determinants of Health   Financial Resource Strain: Not on file  Food Insecurity: Not on file  Transportation Needs: Not on file  Physical Activity: Not on file  Stress: Not on file  Social Connections: Not on file  Intimate Partner Violence: Not on file    Family History:    Family History  Problem Relation Age of Onset   Heart disease Mother    Heart attack Mother    Heart disease Father        before age 10   Heart attack Father    Heart disease Sister         before age 66   Diabetes Sister    Hyperlipidemia Sister    Hypertension Sister    Heart attack Sister    Liver cancer Sister    Breast cancer Sister    Heart disease Brother    Diabetes Brother    Hyperlipidemia Brother    Hypertension Brother    Heart attack Brother    AAA (abdominal aortic aneurysm) Brother    Colon cancer Paternal Grandmother    Heart disease Brother    Colon cancer Cousin        first cousin on father's side   Pancreatic cancer Neg Hx    Rectal cancer Neg Hx    Stomach cancer Neg Hx    Esophageal cancer Neg Hx      ROS:  Please see the history of present illness.   All other ROS reviewed and negative.     Physical Exam/Data:   Vitals:   06/25/21 1357 06/25/21 1410 06/25/21 1423 06/25/21 1442  BP:  (!) 125/40  100/85  Pulse:  71  67  Resp:  12  16  Temp: 98.1 F (36.7 C)  97.8 F (36.6 C) 98 F (36.7 C)  TempSrc: Oral  Oral Oral  SpO2:  97%  94%  Weight:    54.2 kg  Height:    5' (1.524 m)   No intake or output data in the 24 hours ending 06/25/21 1648 Last 3 Weights 06/25/2021 06/25/2021 06/19/2021  Weight (lbs) 119 lb 6.4 oz 118 lb 121 lb 6.1 oz  Weight (kg) 54.159 kg 53.524 kg 55.058 kg     Body mass index is 23.32 kg/m.  General:  Well nourished, well developed, in no acute distress HEENT: normal Lymph: no adenopathy Neck: no JVD Endocrine:  No thryomegaly Vascular: No carotid bruits; FA pulses 2+ bilaterally without bruits  Cardiac:  normal S1, S2; RRR; + systolic murmur  Lungs:  clear to auscultation bilaterally, no wheezing, rhonchi or rales  Abd: soft, nontender, no hepatomegaly  Ext: no edema Musculoskeletal:  No deformities, BUE and BLE strength normal and equal Skin: warm and dry  Neuro:  CNs 2-12 intact, no focal abnormalities noted Psych:  Normal affect   EKG:  The EKG was personally reviewed and demonstrates: SR with nonspecific ST changes, TWI in lead III (actually improved since prior tracings)  Relevant CV  Studies:  Cath: 02/02/21  Conclusions: Severe single-vessel coronary artery disease with chronic total occlusion of mid RCA. Mild to moderate, diffuse LAD disease with competitive flow in the distal vessel from LIMA-LAD. Small but patent LIMA-LAD. Patent SVG-rPDA-rPL with diffuse ostial/proximal  disease of up to 60% and tandem hazy 95% and 90% mid graft lesions. Successful PCI to ostial/proximal and mid SVG-rPDA-rPL using non-overlapping Resolute Onyx 3.0 x 26 mm and 3.5 x 34 mm drug-eluting stents with 0% residual stenosis and TIMI-3 flow. Left forearm hematoma following sheath removal ant TR band placement, controlled with manual compression and placement of second TR band proximal to the first band.   Recommendations: Dual antiplatelet therapy with aspirin and ticagrelor for at least 12 months. Aggressive secondary prevention. Close monitoring of left forearm hematoma.   Nelva Bush, MD [facility]  Diagnostic Dominance: Right    Intervention    Echo: 01/31/21  IMPRESSIONS     1. Left ventricular ejection fraction, by estimation, is 60 to 65%. The  left ventricle has normal function. The left ventricle has no regional  wall motion abnormalities. Left ventricular diastolic parameters are  consistent with Grade I diastolic  dysfunction (impaired relaxation).   2. Right ventricular systolic function is normal. The right ventricular  size is normal. There is normal pulmonary artery systolic pressure.   3. Left atrial size was severely dilated.   4. The mitral valve is grossly normal. Mild mitral valve regurgitation:  Apperance of two jets; study may underestimated regurgitation severity.   5. The aortic valve is tricuspid. Aortic valve regurgitation is not  visualized. No aortic stenosis is present.   6. The inferior vena cava is normal in size with greater than 50%  respiratory variability, suggesting right atrial pressure of 3 mmHg.   Comparison(s): A prior  study was performed on 01/09/21. No significant  change from prior study. Prior images reviewed side by side. Similar to  prior.   Laboratory Data:  High Sensitivity Troponin:   Recent Labs  Lab 06/24/21 1948 06/24/21 2226 06/25/21 0149 06/25/21 0408 06/25/21 0611  TROPONINIHS 5 6 6 6 6      Chemistry Recent Labs  Lab 06/24/21 1948 06/25/21 0408  NA 136  --   K 3.6  --   CL 103  --   CO2 23  --   GLUCOSE 173*  --   BUN 12  --   CREATININE 0.71 0.59  CALCIUM 9.7  --   GFRNONAA >60 >60  ANIONGAP 10  --     No results for input(s): PROT, ALBUMIN, AST, ALT, ALKPHOS, BILITOT in the last 168 hours. Hematology Recent Labs  Lab 06/24/21 1948 06/25/21 0408  WBC 13.3* 13.2*  RBC 5.13* 4.91  HGB 15.0 14.5  HCT 45.8 43.8  MCV 89.3 89.2  MCH 29.2 29.5  MCHC 32.8 33.1  RDW 14.3 14.4  PLT 307 283   BNPNo results for input(s): BNP, PROBNP in the last 168 hours.  DDimer  Recent Labs  Lab 06/25/21 0408  DDIMER 3.00*     Radiology/Studies:  DG Chest 2 View  Result Date: 06/24/2021 CLINICAL DATA:  Chest pain EXAM: CHEST - 2 VIEW COMPARISON:  02/11/2021 FINDINGS: Post sternotomy changes. No consolidation or effusion. Mild atelectasis right base. Mitral annular calcification. Normal heart size. Aortic atherosclerosis. No pneumothorax. Incompletely visualized abdominal aortic stent and surgical hardware in the lumbar spine IMPRESSION: No active cardiopulmonary disease. Electronically Signed   By: Donavan Foil M.D.   On: 06/24/2021 20:03   CT Angio Chest Pulmonary Embolism (PE) W or WO Contrast  Result Date: 06/25/2021 CLINICAL DATA:  Shortness of breath with chest pain. EXAM: CT ANGIOGRAPHY CHEST WITH CONTRAST TECHNIQUE: Multidetector CT imaging of the chest was performed using the standard  protocol during bolus administration of intravenous contrast. Multiplanar CT image reconstructions and MIPs were obtained to evaluate the vascular anatomy. CONTRAST:  43mL OMNIPAQUE IOHEXOL  350 MG/ML SOLN COMPARISON:  None. FINDINGS: Cardiovascular: Heart size upper normal. Status post CABG. Moderate atherosclerotic calcification is noted in the wall of the thoracic aorta. No large central pulmonary embolus in the pulmonary outflow tract or main pulmonary arteries. No lobar pulmonary embolus evident. Assessment of segmental and subsegmental pulmonary arteries to the lower lobes is markedly degraded by substantial motion artifact. Mediastinum/Nodes: No mediastinal lymphadenopathy. 2.1 cm right thyroid nodule evident. There is no hilar lymphadenopathy. The esophagus has normal imaging features. There is no axillary lymphadenopathy. Lungs/Pleura: Advanced changes of centrilobular emphysema noted bilaterally. Fine architectural detail the lungs obscured by motion artifact. Calcified granuloma noted posterior left upper lobe. No overtly suspicious nodule or mass. No focal airspace consolidation. There is no evidence of pleural effusion. Upper Abdomen: Unremarkable. Musculoskeletal: No worrisome lytic or sclerotic osseous abnormality. Review of the MIP images confirms the above findings. IMPRESSION: 1. No large central pulmonary embolus in the pulmonary outflow tract or main pulmonary arteries. Assessment of segmental and subsegmental pulmonary arteries to the lower lobes is unreliable due to substantial motion artifact. 2. 2.1 cm right thyroid nodule. Recommend thyroid US. (Ref: J Am Coll Radiol. 2015 Feb;12(2): 143-50). 3. Advanced changes of emphysema. 4. Aortic Atherosclerosis (ICD10-I70.0) and Emphysema (ICD10-J43.9). Electronically Signed   By: Misty Stanley M.D.   On: 06/25/2021 07:22     Assessment and Plan:   Jazzlin Clements is a 75 y.o. female with a hx of CAD s/p CABG '95 with subsequent NSTEMI and stenting to SVG-RCA, RAS with stenting '08, AAA with stent graft '09, s/p left iliac stenting, HTN and HLD who is being seen 06/25/2021 for the evaluation of chest pain at the request of Dr.  [place] Crane.  Chest pain with hx of CABG and stenting: presents with 2 days of persistent chest pain. Tried reflux meds and SL nitro prior to presenting to the ED. Describes as an "ache-like" sensation in the left side of her chest which is the same as her prior cardiac episodes. hsTn are negative x3, EKG with nonspecific ST changes and TWI in lead III which is similar to prior tracings. No chest pain at present.  -- will make NPO with plans for ischemic evaluation in am stress vs cath. Review with MD in am -- add Imdur 15mg  today -- continue ASA/Effient, statin, BB, losartan and HCTZ  HTN: blood pressures initially elevated on admission. Now improved -- continue BB, ARB/HCTZ  HLD: on high dose statin  Risk Assessment/Risk Scores:   HEAR Score (for undifferentiated chest pain):  HEAR Score: 5{  For questions or updates, please contact [place] Please consult [email or web address] for contact info under    Signed, Reino Bellis, NP  06/25/2021 4:48 PM  Patient seen and examined with Harlan Stains, NP.  Agree as above, with the following exceptions and changes as noted below.  Ms. Simeone is a 75 year old female with a history of CAD status post CABG in 1995 with subsequent NSTEMI and stenting to the SVG to RCA and significant peripheral vascular disease with peripheral vascular intervention as well as well as hypertension and hyperlipidemia who presents for chest pain.  Patient notes a sense of a "tooth ache" over her left breast.  She has tried sublingual nitroglycerin as well as requested a nitro patch in the ED both of which have not provided significant relief of  her discomfort and she remains with a 5 out of 10 aching over her left breast.  She has a history of fibromyalgia and notes that she is all over tender.  During my exam she has tenderness to palpation superior and medial to the left breast but this does not reproduce the chest pain that brought her to the ER.  She is concerned that this  chest pain may represent new obstructive lesions. Gen: NAD, sitting comfortably in the bedside chair in her street clothes, CV: RRR, no murmurs, Lungs: clear, Abd: soft, Extrem: Warm, well perfused, no edema, Neuro/Psych: alert and oriented x 3, normal mood and affect. All available labs, radiology testing, previous records reviewed.  ECG shows sinus rhythm with nonspecific ST abnormality inferolaterally which appears less significant than her ECG from March.  Ms. Cadenhead presents for chest pain with normal troponin values and ECG with no worsened ST changes from prior.  Her primary cardiologist is Dr. Martinique who is on service tomorrow morning.  We will make the patient n.p.o. in anticipation of possible inpatient stress test versus cardiac catheterization at the discretion of Dr. Martinique.  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  To this effect I will start Imdur 15 mg tonight and if she responds well would uptitrate to 30 mg tomorrow home-going.  She has not tried isosorbide in the past.  If she continues to have chest pain after use of Imdur, may then consider further ischemic evaluation.  Elouise Munroe, MD 06/25/21 6:13 PM

## 2021-06-25 NOTE — H&P (Addendum)
History and Physical    Megan [name] UMP:[id number] DOB: Feb 21, 1946 DOA: 06/24/2021  PCP: [name], MD  Patient coming from: home  I have personally briefly reviewed patient's old medical records in [place]  Chief Complaint: chest pain  HPI: Megan [name] is a 75 y.o. female with medical history significant of  renal a stenosis s/p stent 2008, abdominal aortic aneursym s/p stent 2009, PAD s/p left iliac stent HTN,HLD. Patient also has most significant history of CAD s/p MI s/p CABG 1995,NSTEMI 2/26-02/03/21 with peak Ce fo 1085 with ekg showing significant inferolateral TWA. Patient underwent cardiac cath which noted patent LIMA to the LAD. The native RCA was occluded and she had severe sequential lesions in the SVG to RCA which was treated with overlapping DES x 2. Patient was discharge on DAPT with ASA and Brilinta and continued on metoprolol and high dose statin.Patient was later switched to Effient due to intolerance related to [place]. Patient presents to ED with complaints of typical chest pain described as aching . Patient notes that chest discomfort has been ongoing for the last 1-2 days and initially was relieved with nitro but today patient took 3 nitro without relief so she presented to the ED.She notes chest pain is not made worse with exertion and is not associated with radiation, n/v/ sob, palpitations ,cough uri signs and symptoms or presyncope. Patient does not that she was diagnosed with bacterial bronchitis 1 weeks ago and complete antibiotics recently. She states she noted no aching in her chest with her symptoms of bronchitis and notes that her bronchitic symptoms have resolved. Patient currently states that s/p treatment in ed and placement of nitropaste her pain has improved.   ED Course:  Vitals: afeb bp 145/61, hr 100, rr 167, sat 98% on ra Wbc 13.3, hgb 15,plt 307 Glu 173, cr 0.71 CE5,6,6 Cxr:nad Respiratory panel pending  Tx  nitropaste Review of Systems: As per HPI otherwise 10 point review of systems negative.   Past Medical History:  Diagnosis Date   AAA (abdominal aortic aneurysm) ([place])    a. s/p stent grafting in 2009.   Adenomatous colon polyp 05/2001   Adenomatous duodenal polyp    Allergy    Anginal pain (HCC)    COPD (chronic obstructive pulmonary disease) ([place])    pt denies COPD   Coronary artery disease    a. s/p CABG in 1995;  b. 05/2013 Neg MV, EF 82%;  c. 06/2013 Cath: LM nl, LAD 40-50p, LCX nl, RCA 80p/173m, VG->RCA->PDA->PLSA min irregs, LIMA->LAD atretic, vigorous LV fxn.   Diabetes mellitus without complication (HCC)    history of, resolved. 2017   Diverticulosis    Eczema, dyshidrotic    Eczema, dyshidrotic 1986   Dr. Mathis Fare   Emphysema of lung [facility])    Fatty liver    Fibromyalgia    GERD (gastroesophageal reflux disease)    Hyperlipidemia    Hypertension    Migraine    Myocardial infarction [facility])    Osteoarthritis    Osteoporosis    unsure, having a bone scan soon   Peripheral arterial disease ([place])    left leg diagnosed by Dr Mare Ferrari   Renal artery stenosis [facility])    a. 2008 s/p PTA    Past Surgical History:  Procedure Laterality Date   ABDOMINAL AORTAGRAM N/A 03/06/2014   Procedure: ABDOMINAL AORTAGRAM;  Surgeon: Rosetta Posner, MD;  Location: [facility] CATH LAB;  Service: Cardiovascular;  Laterality: N/A;   ABDOMINAL  AORTIC ANEURYSM REPAIR  2009   stenting   ABDOMINAL HYSTERECTOMY     Total, 1992   BREAST BIOPSY     CARDIAC CATHETERIZATION     CARDIOVASCULAR STRESS TEST  11/11/2009   normal study   CARPAL TUNNEL RELEASE  08/1993   left wrist   CARPAL TUNNEL RELEASE  01/1997   right   CATARACT EXTRACTION, BILATERAL  2021   CORONARY ARTERY BYPASS GRAFT  07/1994   Triple by Dr Ceasar Mons   CORONARY STENT INTERVENTION N/A 02/02/2021   Procedure: CORONARY STENT INTERVENTION;  Surgeon: Nelva Bush, MD;  Location: [place] CV LAB;  Service: Cardiovascular;  Laterality:  N/A;   dental implants     EYE SURGERY  03/2020   cataract   HAND SURGERY Right 09/2002   Right hand pulley release   LEFT HEART CATH AND CORS/GRAFTS ANGIOGRAPHY N/A 02/02/2021   Procedure: LEFT HEART CATH AND CORS/GRAFTS ANGIOGRAPHY;  Surgeon: Nelva Bush, MD;  Location: [place] CV LAB;  Service: Cardiovascular;  Laterality: N/A;   LEFT HEART CATHETERIZATION WITH CORONARY/GRAFT ANGIOGRAM N/A 06/11/2013   Procedure: LEFT HEART CATHETERIZATION WITH [name];  Surgeon: Thayer Headings, MD;  Location: [facility] CATH LAB;  Service: Cardiovascular;  Laterality: N/A;   MULTIPLE TOOTH EXTRACTIONS  2000   All upper teeth.  Has full dneture.    RENAL ARTERY STENT  2008   SHOULDER ARTHROSCOPY WITH ROTATOR CUFF REPAIR Right 09/27/2018   Procedure: Right shoulder mini open rotator cuff repair;  Surgeon: Susa Day, MD;  Location: [facility] ORS;  Service: Orthopedics;  Laterality: Right;  90 mins   thyroid cyst apiration  05/1997 and 07/1998   TONSILECTOMY, ADENOIDECTOMY, BILATERAL MYRINGOTOMY AND TUBES  1989   TONSILLECTOMY     TOTAL ABDOMINAL HYSTERECTOMY  1992     reports that she quit smoking about 25 years ago. Her smoking use included cigarettes. She has never used smokeless tobacco. She reports current alcohol use of about 6.0 - 10.0 standard drinks of alcohol per week. She reports that she does not use drugs.  Allergies  Allergen Reactions   Anticoagulant Compound Itching and Other (See Comments)    Hands and feet tingle and itch.    Bextra [Valdecoxib] Other (See Comments)    Increased lft's    Hydrocod Polst-Cpm Polst Er Rash   Lipitor [Atorvastatin] Other (See Comments)    Leg weakness    Mevacor [Lovastatin] Other (See Comments)    Muscle weakness    Plavix [Clopidogrel Bisulfate] Itching and Other (See Comments)    Hands and feet tingle and itch   Zocor [Simvastatin] Other (See Comments)    Bones hurt    Doxycycline Diarrhea    N/v/d   Metoprolol Other (See  Comments)    Hair loss   Morphine And Related Itching and Nausea Only   Codeine Itching and Rash    Hyper-active   Lisinopril Cough    Family History  Problem Relation Age of Onset   Heart disease Mother    Heart attack Mother    Heart disease Father        before age 44   Heart attack Father    Heart disease Sister        before age 73   Diabetes Sister    Hyperlipidemia Sister    Hypertension Sister    Heart attack Sister    Liver cancer Sister    Breast cancer Sister    Heart disease Brother  Diabetes Brother    Hyperlipidemia Brother    Hypertension Brother    Heart attack Brother    AAA (abdominal aortic aneurysm) Brother    Colon cancer Paternal Grandmother    Heart disease Brother    Colon cancer Cousin        first cousin on father's side   Pancreatic cancer Neg Hx    Rectal cancer Neg Hx    Stomach cancer Neg Hx    Esophageal cancer Neg Hx    Prior to Admission medications   Medication Sig Start Date End Date Taking? Authorizing Provider  Accu-Chek FastClix Lancets MISC Check blood sugar once daily PRN 07/30/19   [name], MD  albuterol (PROVENTIL HFA;VENTOLIN HFA) 108 (90 Base) MCG/ACT inhaler Inhale 2 puffs into the lungs every 4 (four) hours as needed for wheezing or shortness of breath. 11/13/18   [name], MD  ALPRAZolam Duanne Moron) 0.25 MG tablet Take 1 tablet (0.25 mg total) by mouth at bedtime as needed for sleep. 09/15/20   Martinique, Peter M, MD  aspirin EC 81 MG tablet Take 1 tablet (81 mg total) by mouth daily. 05/09/20   Martinique, Peter M, MD  atorvastatin (LIPITOR) 80 MG tablet TAKE 1 TABLET BY MOUTH EVERY DAY Patient taking differently: Take 80 mg by mouth daily. 08/18/20   Martinique, Peter M, MD  augmented betamethasone dipropionate (DIPROLENE-AF) 0.05 % cream Apply 1 application topically daily as needed (rash). For hands 10/01/15   [provider]  azithromycin (ZITHROMAX) 250 MG tablet Take 2 tabs today, then 1 tab daily for 4 days  06/19/21   [name], MD  B Complex-Biotin-FA (B-100 COMPLEX PO) Take 1 tablet by mouth at bedtime.     [provider]  Budeson-Glycopyrrol-Formoterol (BREZTRI AEROSPHERE) 160-9-4.8 MCG/ACT AERO Inhale 2 puffs into the lungs in the morning and at bedtime. Patient taking differently: Inhale 1 puff into the lungs daily as needed (shortness of breath). 02/27/20   [name], MD  ciclopirox (LOPROX) 0.77 % cream Apply 1 application topically daily as needed (hands fungus).     [provider]  CORDRAN 4 MCG/SQCM TAPE Apply 1 each topically daily as needed (Rash).  05/17/19   [provider]  cycloSPORINE (RESTASIS) 0.05 % ophthalmic emulsion Place 1 drop into both eyes 2 (two) times daily.     [provider]  diclofenac (VOLTAREN) 75 MG EC tablet Take 1 tablet (75 mg total) by mouth 2 (two) times daily as needed. 03/16/21   [name], MD  gabapentin (NEURONTIN) 300 MG capsule Take 1-2 capsules (300-600 mg total) by mouth 3 (three) times daily. Patient taking differently: Take 300 mg by mouth 2 (two) times daily. 11/15/19   Lomax, Amy, NP  glucose blood (ACCU-CHEK SMARTVIEW) test strip CHECK BLOOD SUGAR ONCE DAILY AS NEEDED 01/17/20   [name], MD  guaiFENesin-codeine 100-10 MG/5ML syrup Take 5 mLs by mouth every 6 (six) hours as needed for cough. 06/19/21   [name], MD  hydrOXYzine (ATARAX/VISTARIL) 25 MG tablet Take 1 tablet (25 mg total) by mouth 3 (three) times daily as needed. 03/09/21   Martinique, Peter M, MD  losartan-hydrochlorothiazide (HYZAAR) 100-25 MG tablet Take 0.5 tablets by mouth daily. 02/03/21   Cheryln Manly, NP  metoprolol tartrate (LOPRESSOR) 25 MG tablet Take 1 tablet (25 mg total) by mouth 2 (two) times daily. 02/18/21   Martinique, Peter M, MD  metoprolol tartrate (LOPRESSOR) 25 MG tablet TAKE 1  TABLET (25 MG TOTAL) BY MOUTH TWO TIMES DAILY. 02/03/21 02/03/22  Cheryln Manly, NP  Multiple Vitamins-Minerals (MULTIVITAMIN WITH  MINERALS) tablet Take 1 tablet by mouth at bedtime.    [provider]  nitroGLYCERIN (NITROSTAT) 0.4 MG SL tablet Place 1 tablet (0.4 mg total) under the tongue every 5 (five) minutes as needed for chest pain. 01/09/20   Martinique, Peter M, MD  nystatin (MYCOSTATIN) 100000 UNIT/ML suspension Take 5 mLs (500,000 Units total) by mouth 4 (four) times daily. 01/19/21   [name], MD  omeprazole (PRILOSEC) 40 MG capsule Take 1 capsule (40 mg total) by mouth in the morning and at bedtime. 05/11/21   Ladene Artist, MD  ondansetron (ZOFRAN) 8 MG tablet Take by mouth every 8 (eight) hours as needed for nausea or vomiting.    [provider]  oxyCODONE-acetaminophen (PERCOCET) 10-325 MG tablet Take 1 tablet by mouth every 6 (six) hours as needed. 04/08/21   [provider]  potassium chloride (KLOR-CON) 10 MEQ tablet Take 1 tablet (10 mEq total) by mouth 2 (two) times daily. 05/26/21   Martinique, Peter M, MD  prasugrel (EFFIENT) 10 MG TABS tablet Take 1/2 tablet ( 5 Mg ) daily 03/09/21   Martinique, Peter M, MD  saccharomyces boulardii (FLORASTOR) 250 MG capsule Take 1 capsule (250 mg total) by mouth 2 (two) times daily. Any probiotic available is okay 01/13/21   Rai, Vernelle Emerald, MD  simethicone (MYLICON) 80 MG chewable tablet Chew 1 tablet (80 mg total) by mouth 4 (four) times daily as needed for flatulence (bloating). also available OTC 01/13/21   Rai, Ripudeep K, MD  ticagrelor (BRILINTA) 90 MG TABS tablet TAKE 1 TABLET (90 MG TOTAL) BY MOUTH TWO TIMES DAILY. 02/03/21 02/03/22  Cheryln Manly, NP  torsemide (DEMADEX) 20 MG tablet Take 0.5 tablets (10 mg total) by mouth daily as needed (fluid). 01/13/21   Rai, Vernelle Emerald, MD  tretinoin (RETIN-A) 0.1 % cream Apply 1 application topically every other day. In the evening    [provider]  VITAMIN D, ERGOCALCIFEROL, PO Take 1,000 Units by mouth every evening.     [provider]    Physical Exam: Vitals:   06/24/21 2035 06/24/21  2218 06/25/21 0058 06/25/21 0203  BP: (!) 157/64 (!) 152/54 (!) 160/55   Pulse: 79 83 76   Resp: 20 20 17    Temp: 98.2 F (36.8 C) 98.1 F (36.7 C) 98.2 F (36.8 C)   TempSrc: Oral  Oral   SpO2: 95% 95% 97%   Weight:    53.5 kg  Height:    5' (1.524 m)     Vitals:   06/24/21 2035 06/24/21 2218 06/25/21 0058 06/25/21 0203  BP: (!) 157/64 (!) 152/54 (!) 160/55   Pulse: 79 83 76   Resp: 20 20 17    Temp: 98.2 F (36.8 C) 98.1 F (36.7 C) 98.2 F (36.8 C)   TempSrc: Oral  Oral   SpO2: 95% 95% 97%   Weight:    53.5 kg  Height:    5' (1.524 m)  Constitutional: NAD, calm, comfortable Eyes: PERRL, lids and conjunctivae normal ENMT: Mucous membranes are moist. Posterior pharynx clear of any exudate or lesions.Normal dentition.  Neck: normal, supple, no masses, no thyromegaly Respiratory: clear to auscultation bilaterally, no wheezing, no crackles. Normal respiratory effort. No accessory muscle use.  Cardiovascular: Regular rate and rhythm, no murmurs / rubs / gallops. No extremity edema. 2+ pedal pulses. No carotid  bruits.  Abdomen: no tenderness, no masses palpated. No hepatosplenomegaly. Bowel sounds positive.  Musculoskeletal: no clubbing / cyanosis. No joint deformity upper and lower extremities. Good ROM, no contractures. Normal muscle tone.  Skin: no rashes, lesions, ulcers. No induration Neurologic: CN 2-12 grossly intact. Sensation intact, DTR normal. Strength 5/5 in all 4.  Psychiatric: Normal judgment and insight. Alert and oriented x 3. Normal mood.    Labs on Admission: I have personally reviewed following labs and imaging studies  CBC: Recent Labs  Lab 06/24/21 1948  WBC 13.3*  HGB 15.0  HCT 45.8  MCV 89.3  PLT 403   Basic Metabolic Panel: Recent Labs  Lab 06/24/21 1948  NA 136  K 3.6  CL 103  CO2 23  GLUCOSE 173*  BUN 12  CREATININE 0.71  CALCIUM 9.7   GFR: Estimated Creatinine Clearance: 43 mL/min (by C-G formula based on SCr of 0.71  mg/dL). Liver Function Tests: No results for input(s): AST, ALT, ALKPHOS, BILITOT, PROT, ALBUMIN in the last 168 hours. No results for input(s): LIPASE, AMYLASE in the last 168 hours. No results for input(s): AMMONIA in the last 168 hours. Coagulation Profile: No results for input(s): INR, PROTIME in the last 168 hours. Cardiac Enzymes: No results for input(s): CKTOTAL, CKMB, CKMBINDEX, TROPONINI in the last 168 hours. BNP (last 3 results) No results for input(s): PROBNP in the last 8760 hours. HbA1C: No results for input(s): HGBA1C in the last 72 hours. CBG: No results for input(s): GLUCAP in the last 168 hours. Lipid Profile: No results for input(s): CHOL, HDL, LDLCALC, TRIG, CHOLHDL, LDLDIRECT in the last 72 hours. Thyroid Function Tests: No results for input(s): TSH, T4TOTAL, FREET4, T3FREE, THYROIDAB in the last 72 hours. Anemia Panel: No results for input(s): VITAMINB12, FOLATE, FERRITIN, TIBC, IRON, RETICCTPCT in the last 72 hours. Urine analysis:    Component Value Date/Time   COLORURINE AMBER (A) 01/05/2021 2349   APPEARANCEUR HAZY (A) 01/05/2021 2349   LABSPEC 1.012 01/05/2021 2349   PHURINE 5.0 01/05/2021 2349   GLUCOSEU NEGATIVE 01/05/2021 2349   GLUCOSEU NEGATIVE 09/09/2015 1611   HGBUR MODERATE (A) 01/05/2021 2349   BILIRUBINUR NEGATIVE 01/05/2021 2349   BILIRUBINUR negative 01/13/2018 [place] 01/05/2021 2349   PROTEINUR 30 (A) 01/05/2021 2349   UROBILINOGEN 0.2 01/13/2018 1131   UROBILINOGEN 0.2 09/09/2015 1611   NITRITE NEGATIVE 01/05/2021 2349   LEUKOCYTESUR LARGE (A) 01/05/2021 2349    Radiological Exams on Admission: DG Chest 2 View  Result Date: 06/24/2021 CLINICAL DATA:  Chest pain EXAM: CHEST - 2 VIEW COMPARISON:  02/11/2021 FINDINGS: Post sternotomy changes. No consolidation or effusion. Mild atelectasis right base. Mitral annular calcification. Normal heart size. Aortic atherosclerosis. No pneumothorax. Incompletely visualized  abdominal aortic stent and surgical hardware in the lumbar spine IMPRESSION: No active cardiopulmonary disease. Electronically Signed   By: Donavan Foil M.D.   On: 06/24/2021 20:03    EKG: Independently reviewed sinus at 101, no hyperacute st changes noted diffuse twave inversions improved compared to prior  Assessment/Plan Chest pain r/o acs  -ce negative, ekg without hyperacute findings  -cxr :nad -patient with persistent chest pain despite reassuring labs and ekg  -continue to cycle ce  -patient notes typical cardiac pain related with past events -continue DAPT, metoprolol and statin  -will continue with nitro paste prn  -cardiology consult re-need for further evaluation  -to be complete will check d-dimer and CTPA if positive  Abn UA  -no symptoms  -await culture data  HTN -borderline control -resume home regimen  HLD -continue with statin   Anxiety -resume home regimen once med rec completed   DVT prophylaxis: scd Code Status: full Family Communication:   no family at bedside  Disposition Plan: admit as observation  Consults called: Cardiology  Admission status: observation    Clance Boll MD [facility]  If 7PM-7AM, please contact night-coverage [email or web address] Password [email or web address]  06/25/2021, 3:02 AM

## 2021-06-25 NOTE — ED Notes (Signed)
Attempted report x1. 

## 2021-06-25 NOTE — ED Notes (Signed)
Patient transported to CT 

## 2021-06-25 NOTE — ED Notes (Signed)
Pt here with "aching heart". Said it's been going on since yesterday afternoon. Denies pain. Michela Pitcher she had a heart attack in February 2022 and it felt exactly the same. Denies other sx associated with it

## 2021-06-25 NOTE — ED Notes (Signed)
Pt requesting home dose of percocet and pt feels like her O2 is low. O2 on RA 91-95%. Placed on 2 L [place] for comfort. PA notified. See new orders.

## 2021-06-25 NOTE — Telephone Encounter (Signed)
Pt is currently in the ER and she would like to know if anyone from [facility]  preferably Dr. Martinique  is going to round on her while she is there. Pt is at [facility]. Please advise pt further on her cell phone

## 2021-06-25 NOTE — Progress Notes (Addendum)
Megan [name] is a 75 y.o. female with medical history significant of  renal a stenosis s/p stent 2008, abdominal aortic aneursym s/p stent 2009, PAD s/p left iliac stent HTN,HLD. Patient also has most significant history of CAD s/p MI s/p CABG 1995,NSTEMI 2/26-02/03/21 with peak Ce fo 1085 with ekg showing significant inferolateral TWA. Patient underwent cardiac cath which noted patent LIMA to the LAD. The native RCA was occluded and she had severe sequential lesions in the SVG to RCA which was treated with overlapping DES x 2. Patient was discharge on DAPT with ASA and Brilinta and continued on metoprolol and high dose statin.Patient was later switched to Effient due to intolerance related to [place]. Patient presents to ED with complaints of typical chest pain described as aching . Patient notes that chest discomfort has been ongoing for the last 1-2 days and initially was relieved with nitro but today patient took 3 nitro without relief so she presented to the ED.She notes chest pain is not made worse with exertion and is not associated with radiation, n/v/ sob, palpitations ,cough uri signs and symptoms or presyncope. Patient does not that she was diagnosed with bacterial bronchitis 1 weeks ago and complete antibiotics recently. She states she noted no aching in her chest with her symptoms of bronchitis and notes that her bronchitic symptoms have resolved. Patient currently states that s/p treatment in ed and placement of nitropaste her pain has improved.   Seen in the ED.  She continues to have left-sided chest pain.  States constant, achy, like a tooth ache.  Heart score 4.  Cardiology has been consulted for persistent chest pain to rule out ACS.  Additionally, she was incidentally found to have a 2.1 cm right thyroid nodule.  She was aware of this and states it has been noted in the past.  There is radiology recommendation for thyroid ultrasound.  TSH and free T4 have been ordered in are pending.       Please refer to H&P dictated by my partner Dr. Marcello Moores on 06/25/2021 for further details of the assessment and plan.

## 2021-06-25 NOTE — ED Notes (Signed)
Breakfast Ordered 

## 2021-06-25 NOTE — ED Notes (Signed)
Pt just got back to hallway stretcher from triage.

## 2021-06-26 ENCOUNTER — Encounter (HOSPITAL_COMMUNITY)
Admission: EM | Disposition: A | Discharge status: Home / Self Care | Payer: Self-pay | Source: Home / Self Care | Attending: Internal Medicine

## 2021-06-26 DIAGNOSIS — I25700 Atherosclerosis of coronary artery bypass graft(s), unspecified, with unstable angina pectoris: Secondary | ICD-10-CM

## 2021-06-26 DIAGNOSIS — Z87891 Personal history of nicotine dependence: Secondary | ICD-10-CM | POA: Diagnosis not present

## 2021-06-26 DIAGNOSIS — I200 Unstable angina: Secondary | ICD-10-CM | POA: Diagnosis not present

## 2021-06-26 DIAGNOSIS — I252 Old myocardial infarction: Secondary | ICD-10-CM | POA: Diagnosis not present

## 2021-06-26 DIAGNOSIS — Z951 Presence of aortocoronary bypass graft: Secondary | ICD-10-CM | POA: Diagnosis not present

## 2021-06-26 DIAGNOSIS — Z7951 Long term (current) use of inhaled steroids: Secondary | ICD-10-CM | POA: Diagnosis not present

## 2021-06-26 DIAGNOSIS — K760 Fatty (change of) liver, not elsewhere classified: Secondary | ICD-10-CM | POA: Diagnosis not present

## 2021-06-26 DIAGNOSIS — Z90710 Acquired absence of both cervix and uterus: Secondary | ICD-10-CM | POA: Diagnosis not present

## 2021-06-26 DIAGNOSIS — I25710 Atherosclerosis of autologous vein coronary artery bypass graft(s) with unstable angina pectoris: Secondary | ICD-10-CM | POA: Diagnosis not present

## 2021-06-26 DIAGNOSIS — I25110 Atherosclerotic heart disease of native coronary artery with unstable angina pectoris: Secondary | ICD-10-CM | POA: Diagnosis not present

## 2021-06-26 DIAGNOSIS — Z800 Family history of malignant neoplasm of digestive organs: Secondary | ICD-10-CM | POA: Diagnosis not present

## 2021-06-26 DIAGNOSIS — K219 Gastro-esophageal reflux disease without esophagitis: Secondary | ICD-10-CM | POA: Diagnosis present

## 2021-06-26 DIAGNOSIS — T82855A Stenosis of coronary artery stent, initial encounter: Secondary | ICD-10-CM | POA: Diagnosis not present

## 2021-06-26 DIAGNOSIS — M797 Fibromyalgia: Secondary | ICD-10-CM | POA: Diagnosis not present

## 2021-06-26 DIAGNOSIS — J439 Emphysema, unspecified: Secondary | ICD-10-CM | POA: Diagnosis not present

## 2021-06-26 DIAGNOSIS — E785 Hyperlipidemia, unspecified: Secondary | ICD-10-CM | POA: Diagnosis not present

## 2021-06-26 DIAGNOSIS — I10 Essential (primary) hypertension: Secondary | ICD-10-CM | POA: Diagnosis not present

## 2021-06-26 DIAGNOSIS — Z79899 Other long term (current) drug therapy: Secondary | ICD-10-CM | POA: Diagnosis not present

## 2021-06-26 DIAGNOSIS — Z20822 Contact with and (suspected) exposure to covid-19: Secondary | ICD-10-CM | POA: Diagnosis not present

## 2021-06-26 DIAGNOSIS — E7800 Pure hypercholesterolemia, unspecified: Secondary | ICD-10-CM | POA: Diagnosis not present

## 2021-06-26 DIAGNOSIS — Z833 Family history of diabetes mellitus: Secondary | ICD-10-CM | POA: Diagnosis not present

## 2021-06-26 DIAGNOSIS — F419 Anxiety disorder, unspecified: Secondary | ICD-10-CM | POA: Diagnosis present

## 2021-06-26 DIAGNOSIS — Z7902 Long term (current) use of antithrombotics/antiplatelets: Secondary | ICD-10-CM | POA: Diagnosis not present

## 2021-06-26 DIAGNOSIS — Y832 Surgical operation with anastomosis, bypass or graft as the cause of abnormal reaction of the patient, or of later complication, without mention of misadventure at the time of the procedure: Secondary | ICD-10-CM | POA: Diagnosis present

## 2021-06-26 DIAGNOSIS — E041 Nontoxic single thyroid nodule: Secondary | ICD-10-CM | POA: Diagnosis not present

## 2021-06-26 DIAGNOSIS — Z7982 Long term (current) use of aspirin: Secondary | ICD-10-CM | POA: Diagnosis not present

## 2021-06-26 DIAGNOSIS — Z803 Family history of malignant neoplasm of breast: Secondary | ICD-10-CM | POA: Diagnosis not present

## 2021-06-26 DIAGNOSIS — Z8249 Family history of ischemic heart disease and other diseases of the circulatory system: Secondary | ICD-10-CM | POA: Diagnosis not present

## 2021-06-26 DIAGNOSIS — Z955 Presence of coronary angioplasty implant and graft: Secondary | ICD-10-CM | POA: Diagnosis not present

## 2021-06-26 DIAGNOSIS — R079 Chest pain, unspecified: Secondary | ICD-10-CM | POA: Diagnosis present

## 2021-06-26 HISTORY — PX: LEFT HEART CATH AND CORONARY ANGIOGRAPHY: CATH118249

## 2021-06-26 HISTORY — PX: CORONARY STENT INTERVENTION: CATH118234

## 2021-06-26 HISTORY — PX: CORONARY ULTRASOUND/IVUS: CATH118244

## 2021-06-26 LAB — POCT ACTIVATED CLOTTING TIME: Activated Clotting Time: 341 seconds

## 2021-06-26 SURGERY — LEFT HEART CATH AND CORONARY ANGIOGRAPHY
Anesthesia: LOCAL

## 2021-06-26 MED ORDER — HEPARIN SODIUM (PORCINE) 1000 UNIT/ML IJ SOLN
INTRAMUSCULAR | Status: AC
  Filled 2021-06-26: qty 1

## 2021-06-26 MED ORDER — SODIUM CHLORIDE 0.9% FLUSH: 3.0000 mL | INTRAVENOUS | Status: DC | PRN

## 2021-06-26 MED ORDER — SODIUM CHLORIDE 0.9% FLUSH
3.0000 mL | Freq: Two times a day (BID) | INTRAVENOUS | Status: DC
  Administered 2021-06-26: 3 mL via INTRAVENOUS

## 2021-06-26 MED ORDER — LIDOCAINE HCL (PF) 1 % IJ SOLN
INTRAMUSCULAR | Status: DC | PRN
  Administered 2021-06-26: 10 mL

## 2021-06-26 MED ORDER — IOHEXOL 350 MG/ML SOLN
INTRAVENOUS | Status: DC | PRN
  Administered 2021-06-26: 100 mL via INTRA_ARTERIAL

## 2021-06-26 MED ORDER — SODIUM CHLORIDE 0.9% FLUSH
3.0000 mL | Freq: Two times a day (BID) | INTRAVENOUS | Status: DC
  Administered 2021-06-27: 3 mL via INTRAVENOUS

## 2021-06-26 MED ORDER — FENTANYL CITRATE (PF) 100 MCG/2ML IJ SOLN
INTRAMUSCULAR | Status: DC | PRN
  Administered 2021-06-26: 50 ug via INTRAVENOUS
  Administered 2021-06-26 (×3): 25 ug via INTRAVENOUS

## 2021-06-26 MED ORDER — SODIUM CHLORIDE 0.9 % IV SOLN: 250.0000 mL | INTRAVENOUS | Status: DC | PRN

## 2021-06-26 MED ORDER — FENTANYL CITRATE (PF) 100 MCG/2ML IJ SOLN
INTRAMUSCULAR | Status: AC
  Filled 2021-06-26: qty 2

## 2021-06-26 MED ORDER — NITROGLYCERIN 0.4 MG SL SUBL
SUBLINGUAL_TABLET | SUBLINGUAL | Status: AC
  Administered 2021-06-26: 0.4 mg
  Filled 2021-06-26: qty 1

## 2021-06-26 MED ORDER — SODIUM CHLORIDE 0.9 % WEIGHT BASED INFUSION: 3.0000 mL/kg/h | INTRAVENOUS | Status: DC

## 2021-06-26 MED ORDER — SODIUM CHLORIDE 0.9 % IV SOLN
INTRAVENOUS | Status: DC | PRN
  Administered 2021-06-26: 1.75 mg/kg/h via INTRAVENOUS

## 2021-06-26 MED ORDER — NITROGLYCERIN 1 MG/10 ML FOR IR/CATH LAB
INTRA_ARTERIAL | Status: DC | PRN
  Administered 2021-06-26 (×2): 200 ug via INTRACORONARY

## 2021-06-26 MED ORDER — SODIUM CHLORIDE 0.9 % WEIGHT BASED INFUSION
3.0000 mL/kg/h | INTRAVENOUS | Status: AC
  Administered 2021-06-26: 3 mL/kg/h via INTRAVENOUS

## 2021-06-26 MED ORDER — SODIUM CHLORIDE 0.9 % WEIGHT BASED INFUSION
1.0000 mL/kg/h | INTRAVENOUS | Status: AC
  Administered 2021-06-26: 1 mL/kg/h via INTRAVENOUS

## 2021-06-26 MED ORDER — SODIUM CHLORIDE 0.9 % WEIGHT BASED INFUSION: 1.0000 mL/kg/h | INTRAVENOUS | Status: DC

## 2021-06-26 MED ORDER — BIVALIRUDIN BOLUS VIA INFUSION - CUPID
INTRAVENOUS | Status: DC | PRN
  Administered 2021-06-26: 40.65 mg via INTRAVENOUS

## 2021-06-26 MED ORDER — MIDAZOLAM HCL 2 MG/2ML IJ SOLN
INTRAMUSCULAR | Status: DC | PRN
  Administered 2021-06-26 (×2): 2 mg via INTRAVENOUS
  Administered 2021-06-26: 1 mg via INTRAVENOUS

## 2021-06-26 MED ORDER — HEPARIN (PORCINE) IN NACL 1000-0.9 UT/500ML-% IV SOLN
INTRAVENOUS | Status: DC | PRN
  Administered 2021-06-26 (×3): 500 mL

## 2021-06-26 MED ORDER — LIDOCAINE HCL (PF) 1 % IJ SOLN
INTRAMUSCULAR | Status: AC
  Filled 2021-06-26: qty 30

## 2021-06-26 MED ORDER — BIVALIRUDIN TRIFLUOROACETATE 250 MG IV SOLR
INTRAVENOUS | Status: AC
  Filled 2021-06-26: qty 250

## 2021-06-26 MED ORDER — HYDRALAZINE HCL 20 MG/ML IJ SOLN: 10.0000 mg | INTRAMUSCULAR | Status: AC | PRN

## 2021-06-26 MED ORDER — VERAPAMIL HCL 2.5 MG/ML IV SOLN
INTRAVENOUS | Status: AC
  Filled 2021-06-26: qty 2

## 2021-06-26 MED ORDER — MIDAZOLAM HCL 2 MG/2ML IJ SOLN
INTRAMUSCULAR | Status: AC
  Filled 2021-06-26: qty 2

## 2021-06-26 MED ORDER — SODIUM CHLORIDE 0.9 % WEIGHT BASED INFUSION
1.0000 mL/kg/h | INTRAVENOUS | Status: DC
  Administered 2021-06-26: 1 mL/kg/h via INTRAVENOUS

## 2021-06-26 MED ORDER — HEPARIN (PORCINE) IN NACL 1000-0.9 UT/500ML-% IV SOLN
INTRAVENOUS | Status: AC
  Filled 2021-06-26: qty 1000

## 2021-06-26 MED ORDER — NITROGLYCERIN 1 MG/10 ML FOR IR/CATH LAB
INTRA_ARTERIAL | Status: AC
  Filled 2021-06-26: qty 10

## 2021-06-26 SURGICAL SUPPLY — 24 items
BALLN EUPHORA RX 3.5X12 (BALLOONS) ×2
BALLN SAPPHIRE ~~LOC~~ 3.75X8 (BALLOONS) ×2 IMPLANT
BALLN SAPPHIRE ~~LOC~~ 4.0X15 (BALLOONS) ×2 IMPLANT
BALLOON EUPHORA RX 3.5X12 (BALLOONS) ×1 IMPLANT
CATH EXPO 5F MPA-1 (CATHETERS) ×2 IMPLANT
CATH INFINITI 5 FR IM (CATHETERS) ×2 IMPLANT
CATH INFINITI 5FR AL1 (CATHETERS) ×2 IMPLANT
CATH INFINITI 5FR MULTPACK ANG (CATHETERS) ×2 IMPLANT
CATH OPTICROSS HD (CATHETERS) ×2 IMPLANT
CATHETER LAUNCHER 6FR MP1 (CATHETERS) ×2 IMPLANT
CLOSURE PERCLOSE PROSTYLE (VASCULAR PRODUCTS) ×2 IMPLANT
KIT ENCORE 26 ADVANTAGE (KITS) ×2 IMPLANT
KIT HEART LEFT (KITS) ×2 IMPLANT
PACK CARDIAC CATHETERIZATION (CUSTOM PROCEDURE TRAY) ×2 IMPLANT
SHEATH PINNACLE 5F 10CM (SHEATH) ×2 IMPLANT
SHEATH PINNACLE 6F 10CM (SHEATH) ×2 IMPLANT
SLED PULL BACK IVUS (MISCELLANEOUS) ×2 IMPLANT
STENT ONYX FRONTIER 3.5X26 (Permanent Stent) ×2 IMPLANT
TRANSDUCER W/STOPCOCK (MISCELLANEOUS) ×2 IMPLANT
TUBING CIL FLEX 10 FLL-RA (TUBING) ×2 IMPLANT
VALVE COPILOT STAT (MISCELLANEOUS) ×2 IMPLANT
WIRE COUGAR XT STRL 190CM (WIRE) ×2 IMPLANT
WIRE EMERALD 3MM-J .035X150CM (WIRE) ×2 IMPLANT
WIRE EMERALD 3MM-J .035X260CM (WIRE) ×2 IMPLANT

## 2021-06-26 NOTE — H&P (View-Only) (Signed)
Progress Note  Patient Name: Britzy Longenecker Date of Encounter: 06/26/2021  [facility] Cardiologist: Kamia Insalaco [place], MD   Subjective   States isosorbide has helped her chest pain. Now pain free. Pain is the same as what she experienced in February. She also has some discomfort when she turns her torso which is new.  Inpatient Medications    Scheduled Meds:  aspirin EC  81 mg Oral Daily   atorvastatin  80 mg Oral Daily   gabapentin  300 mg Oral BID   heparin  5,000 Units Subcutaneous Q8H   losartan  50 mg Oral Daily   And   hydrochlorothiazide  12.5 mg Oral Daily   isosorbide mononitrate  15 mg Oral Daily   metoprolol tartrate  25 mg Oral BID   pantoprazole  40 mg Oral Daily   prasugrel  5 mg Oral Daily   Continuous Infusions:  PRN Meds: acetaminophen, hydrOXYzine, ondansetron (ZOFRAN) IV, oxyCODONE-acetaminophen **AND** oxyCODONE   Vital Signs    Vitals:   06/25/21 1442 06/25/21 2111 06/26/21 0008 06/26/21 0623  BP: 100/85 (!) 137/54 105/67 (!) 154/68  Pulse: 67 75 60 80  Resp: '16 16 20 20  '$ Temp: 98 F (36.7 C) 99 F (37.2 C)  98.4 F (36.9 C)  TempSrc: Oral Oral  Oral  SpO2: 94% 96% 98% 95%  Weight: 54.2 kg     Height: 5' (1.524 m)       Intake/Output Summary (Last 24 hours) at 06/26/2021 0750 Last data filed at 06/26/2021 0600 Gross per 24 hour  Intake 120 ml  Output 0 ml  Net 120 ml   Last 3 Weights 06/25/2021 06/25/2021 06/19/2021  Weight (lbs) 119 lb 6.4 oz 118 lb 121 lb 6.1 oz  Weight (kg) 54.159 kg 53.524 kg 55.058 kg      Telemetry    NSR - Personally Reviewed  ECG    NSR with marked T wave inversion inferiorly. New since yesterday - Personally Reviewed  Physical Exam    GEN: No acute distress.   Neck: No JVD Cardiac: RRR, no murmurs, rubs, or gallops.  Respiratory: Clear to auscultation bilaterally. GI: Soft, nontender, non-distended  MS: No edema; No deformity. Neuro:  Nonfocal  Psych: Normal affect   Labs    High  Sensitivity Troponin:   Recent Labs  Lab 06/24/21 1948 06/24/21 2226 06/25/21 0149 06/25/21 0408 06/25/21 0611  TROPONINIHS '5 6 6 6 6      '$ Chemistry Recent Labs  Lab 06/24/21 1948 06/25/21 0408  NA 136  --   K 3.6  --   CL 103  --   CO2 23  --   GLUCOSE 173*  --   BUN 12  --   CREATININE 0.71 0.59  CALCIUM 9.7  --   GFRNONAA >60 >60  ANIONGAP 10  --      Hematology Recent Labs  Lab 06/24/21 1948 06/25/21 0408  WBC 13.3* 13.2*  RBC 5.13* 4.91  HGB 15.0 14.5  HCT 45.8 43.8  MCV 89.3 89.2  MCH 29.2 29.5  MCHC 32.8 33.1  RDW 14.3 14.4  PLT 307 283    BNPNo results for input(s): BNP, PROBNP in the last 168 hours.   DDimer  Recent Labs  Lab 06/25/21 0408  DDIMER 3.00*     Radiology    DG Chest 2 View  Result Date: 06/24/2021 CLINICAL DATA:  Chest pain EXAM: CHEST - 2 VIEW COMPARISON:  02/11/2021 FINDINGS: Post sternotomy changes. No consolidation or  effusion. Mild atelectasis right base. Mitral annular calcification. Normal heart size. Aortic atherosclerosis. No pneumothorax. Incompletely visualized abdominal aortic stent and surgical hardware in the lumbar spine IMPRESSION: No active cardiopulmonary disease. Electronically Signed   By: Donavan Foil M.D.   On: 06/24/2021 20:03   CT Angio Chest Pulmonary Embolism (PE) W or WO Contrast  Result Date: 06/25/2021 CLINICAL DATA:  Shortness of breath with chest pain. EXAM: CT ANGIOGRAPHY CHEST WITH CONTRAST TECHNIQUE: Multidetector CT imaging of the chest was performed using the standard protocol during bolus administration of intravenous contrast. Multiplanar CT image reconstructions and MIPs were obtained to evaluate the vascular anatomy. CONTRAST:  80m OMNIPAQUE IOHEXOL 350 MG/ML SOLN COMPARISON:  None. FINDINGS: Cardiovascular: Heart size upper normal. Status post CABG. Moderate atherosclerotic calcification is noted in the wall of the thoracic aorta. No large central pulmonary embolus in the pulmonary outflow  tract or main pulmonary arteries. No lobar pulmonary embolus evident. Assessment of segmental and subsegmental pulmonary arteries to the lower lobes is markedly degraded by substantial motion artifact. Mediastinum/Nodes: No mediastinal lymphadenopathy. 2.1 cm right thyroid nodule evident. There is no hilar lymphadenopathy. The esophagus has normal imaging features. There is no axillary lymphadenopathy. Lungs/Pleura: Advanced changes of centrilobular emphysema noted bilaterally. Fine architectural detail the lungs obscured by motion artifact. Calcified granuloma noted posterior left upper lobe. No overtly suspicious nodule or mass. No focal airspace consolidation. There is no evidence of pleural effusion. Upper Abdomen: Unremarkable. Musculoskeletal: No worrisome lytic or sclerotic osseous abnormality. Review of the MIP images confirms the above findings. IMPRESSION: 1. No large central pulmonary embolus in the pulmonary outflow tract or main pulmonary arteries. Assessment of segmental and subsegmental pulmonary arteries to the lower lobes is unreliable due to substantial motion artifact. 2. 2.1 cm right thyroid nodule. Recommend thyroid [place] (Ref: J Am Coll Radiol. 2015 Feb;12(2): 143-50). 3. Advanced changes of emphysema. 4. Aortic Atherosclerosis (ICD10-I70.0) and Emphysema (ICD10-J43.9). Electronically Signed   By: EMisty StanleyM.D.   On: 06/25/2021 07:22    Cardiac Studies   Cath: 02/02/21   Conclusions: Severe single-vessel coronary artery disease with chronic total occlusion of mid RCA. Mild to moderate, diffuse LAD disease with competitive flow in the distal vessel from LIMA-LAD. Small but patent LIMA-LAD. Patent SVG-rPDA-rPL with diffuse ostial/proximal disease of up to 60% and tandem hazy 95% and 90% mid graft lesions. Successful PCI to ostial/proximal and mid SVG-rPDA-rPL using non-overlapping Resolute Onyx 3.0 x 26 mm and 3.5 x 34 mm drug-eluting stents with 0% residual stenosis and TIMI-3  flow. Left forearm hematoma following sheath removal ant TR band placement, controlled with manual compression and placement of second TR band proximal to the first band.   Recommendations: Dual antiplatelet therapy with aspirin and ticagrelor for at least 12 months. Aggressive secondary prevention. Close monitoring of left forearm hematoma.   CNelva Bush MD [facility]   Diagnostic Dominance: Right      Intervention      Echo: 01/31/21   IMPRESSIONS     1. Left ventricular ejection fraction, by estimation, is 60 to 65%. The  left ventricle has normal function. The left ventricle has no regional  wall motion abnormalities. Left ventricular diastolic parameters are  consistent with Grade I diastolic  dysfunction (impaired relaxation).   2. Right ventricular systolic function is normal. The right ventricular  size is normal. There is normal pulmonary artery systolic pressure.   3. Left atrial size was severely dilated.   4. The mitral valve is grossly normal.  Mild mitral valve regurgitation:  Apperance of two jets; study may underestimated regurgitation severity.   5. The aortic valve is tricuspid. Aortic valve regurgitation is not  visualized. No aortic stenosis is present.   6. The inferior vena cava is normal in size with greater than 50%  respiratory variability, suggesting right atrial pressure of 3 mmHg.   Comparison(s): A prior study was performed on 01/09/21. No significant  change from prior study. Prior images reviewed side by side. Similar to  prior.    Patient Profile     75 y.o. female with a hx of CAD s/p CABG '95 with subsequent NSTEMI and stenting to SVG-RCA, RAS with stenting '08, AAA with stent graft '09, s/p left iliac stenting, HTN and HLD who is being seen 06/25/2021 for the evaluation of chest pain at the request of Dr. [place] Crane.  Assessment & Plan    Unstable angina with hx of CABG and stenting: s/p CABG in 1995. Did well until Feb 2022 when she  presented with NSTEMI and had 2 stents to SVG to RCA. LIMA patent. presents with 2 days of persistent chest pain. Tried reflux meds and SL nitro prior to presenting to the ED. Describes as an "ache-like" sensation in the left side of her chest which is the same as her prior cardiac episodes. hsTn are negative x3, EKG initially with nonspecific ST changes and TWI in lead III but now with marked T wave inversion inferolaterally.  No chest pain at present. -- Given recurrent chest pain, dynamic Ecg changes and prior stenting of a 75 yo vein graft I feel she needs invasive cardiac cardiac cath today. She is in agreement.  -- continue ASA/Effient, statin, BB, losartan and HCTZ -- now on Imdur   2. HTN: blood pressures initially elevated on admission. Now improved -- continue BB, ARB/HCTZ   3. HLD: on high dose statin LDL 73.      For questions or updates, please contact [place] Please consult [email or web address] for contact info under        Signed, Zaidy Absher [place], MD  06/26/2021, 7:50 AM

## 2021-06-26 NOTE — Plan of Care (Signed)
  Problem: Education: Goal: Knowledge of General Education information will improve Description Including pain rating scale, medication(s)/side effects and non-pharmacologic comfort measures Outcome: Progressing   

## 2021-06-26 NOTE — Interval H&P Note (Signed)
Cath Lab Visit (complete for each Cath Lab visit)  Clinical Evaluation Leading to the Procedure:   ACS: Yes.    Non-ACS:    Anginal Classification: CCS IV  Anti-ischemic medical therapy: Maximal Therapy (2 or more classes of medications)  Non-Invasive Test Results: No non-invasive testing performed  Prior CABG: Previous CABG  History and Physical Interval Note:  06/26/2021 2:25 PM  Megan [name]  has presented today for surgery, with the diagnosis of unstable angina.  The various methods of treatment have been discussed with the patient and family. After consideration of risks, benefits and other options for treatment, the patient has consented to  Procedure(s): LEFT HEART CATH AND CORONARY ANGIOGRAPHY (N/A) as a surgical intervention.  The patient's history has been reviewed, patient examined, no change in status, stable for surgery.  I have reviewed the patient's chart and labs.  Questions were answered to the patient's satisfaction.     Sherren Mocha

## 2021-06-26 NOTE — Progress Notes (Signed)
Pt has been standing at the sink cleaning for over an hour and refuses to get in bed and put the monitor back on. Pt stated that she will get back in bed in a while. Monitor tech aware.

## 2021-06-26 NOTE — Progress Notes (Signed)
Progress Note  Patient Name: Megan [name] Date of Encounter: 06/26/2021  [facility] Cardiologist: Mase Dhondt [place], MD   Subjective   States isosorbide has helped her chest pain. Now pain free. Pain is the same as what she experienced in February. She also has some discomfort when she turns her torso which is new.  Inpatient Medications    Scheduled Meds:  aspirin EC  81 mg Oral Daily   atorvastatin  80 mg Oral Daily   gabapentin  300 mg Oral BID   heparin  5,000 Units Subcutaneous Q8H   losartan  50 mg Oral Daily   And   hydrochlorothiazide  12.5 mg Oral Daily   isosorbide mononitrate  15 mg Oral Daily   metoprolol tartrate  25 mg Oral BID   pantoprazole  40 mg Oral Daily   prasugrel  5 mg Oral Daily   Continuous Infusions:  PRN Meds: acetaminophen, hydrOXYzine, ondansetron (ZOFRAN) IV, oxyCODONE-acetaminophen **AND** oxyCODONE   Vital Signs    Vitals:   06/25/21 1442 06/25/21 2111 06/26/21 0008 06/26/21 0623  BP: 100/85 (!) 137/54 105/67 (!) 154/68  Pulse: 67 75 60 80  Resp: '16 16 20 20  '$ Temp: 98 F (36.7 C) 99 F (37.2 C)  98.4 F (36.9 C)  TempSrc: Oral Oral  Oral  SpO2: 94% 96% 98% 95%  Weight: 54.2 kg     Height: 5' (1.524 m)       Intake/Output Summary (Last 24 hours) at 06/26/2021 0750 Last data filed at 06/26/2021 0600 Gross per 24 hour  Intake 120 ml  Output 0 ml  Net 120 ml   Last 3 Weights 06/25/2021 06/25/2021 06/19/2021  Weight (lbs) 119 lb 6.4 oz 118 lb 121 lb 6.1 oz  Weight (kg) 54.159 kg 53.524 kg 55.058 kg      Telemetry    NSR - Personally Reviewed  ECG    NSR with marked T wave inversion inferiorly. New since yesterday - Personally Reviewed  Physical Exam    GEN: No acute distress.   Neck: No JVD Cardiac: RRR, no murmurs, rubs, or gallops.  Respiratory: Clear to auscultation bilaterally. GI: Soft, nontender, non-distended  MS: No edema; No deformity. Neuro:  Nonfocal  Psych: Normal affect   Labs    High  Sensitivity Troponin:   Recent Labs  Lab 06/24/21 1948 06/24/21 2226 06/25/21 0149 06/25/21 0408 06/25/21 0611  TROPONINIHS '5 6 6 6 6      '$ Chemistry Recent Labs  Lab 06/24/21 1948 06/25/21 0408  NA 136  --   K 3.6  --   CL 103  --   CO2 23  --   GLUCOSE 173*  --   BUN 12  --   CREATININE 0.71 0.59  CALCIUM 9.7  --   GFRNONAA >60 >60  ANIONGAP 10  --      Hematology Recent Labs  Lab 06/24/21 1948 06/25/21 0408  WBC 13.3* 13.2*  RBC 5.13* 4.91  HGB 15.0 14.5  HCT 45.8 43.8  MCV 89.3 89.2  MCH 29.2 29.5  MCHC 32.8 33.1  RDW 14.3 14.4  PLT 307 283    BNPNo results for input(s): BNP, PROBNP in the last 168 hours.   DDimer  Recent Labs  Lab 06/25/21 0408  DDIMER 3.00*     Radiology    DG Chest 2 View  Result Date: 06/24/2021 CLINICAL DATA:  Chest pain EXAM: CHEST - 2 VIEW COMPARISON:  02/11/2021 FINDINGS: Post sternotomy changes. No consolidation or  effusion. Mild atelectasis right base. Mitral annular calcification. Normal heart size. Aortic atherosclerosis. No pneumothorax. Incompletely visualized abdominal aortic stent and surgical hardware in the lumbar spine IMPRESSION: No active cardiopulmonary disease. Electronically Signed   By: Donavan Foil M.D.   On: 06/24/2021 20:03   CT Angio Chest Pulmonary Embolism (PE) W or WO Contrast  Result Date: 06/25/2021 CLINICAL DATA:  Shortness of breath with chest pain. EXAM: CT ANGIOGRAPHY CHEST WITH CONTRAST TECHNIQUE: Multidetector CT imaging of the chest was performed using the standard protocol during bolus administration of intravenous contrast. Multiplanar CT image reconstructions and MIPs were obtained to evaluate the vascular anatomy. CONTRAST:  71m OMNIPAQUE IOHEXOL 350 MG/ML SOLN COMPARISON:  None. FINDINGS: Cardiovascular: Heart size upper normal. Status post CABG. Moderate atherosclerotic calcification is noted in the wall of the thoracic aorta. No large central pulmonary embolus in the pulmonary outflow  tract or main pulmonary arteries. No lobar pulmonary embolus evident. Assessment of segmental and subsegmental pulmonary arteries to the lower lobes is markedly degraded by substantial motion artifact. Mediastinum/Nodes: No mediastinal lymphadenopathy. 2.1 cm right thyroid nodule evident. There is no hilar lymphadenopathy. The esophagus has normal imaging features. There is no axillary lymphadenopathy. Lungs/Pleura: Advanced changes of centrilobular emphysema noted bilaterally. Fine architectural detail the lungs obscured by motion artifact. Calcified granuloma noted posterior left upper lobe. No overtly suspicious nodule or mass. No focal airspace consolidation. There is no evidence of pleural effusion. Upper Abdomen: Unremarkable. Musculoskeletal: No worrisome lytic or sclerotic osseous abnormality. Review of the MIP images confirms the above findings. IMPRESSION: 1. No large central pulmonary embolus in the pulmonary outflow tract or main pulmonary arteries. Assessment of segmental and subsegmental pulmonary arteries to the lower lobes is unreliable due to substantial motion artifact. 2. 2.1 cm right thyroid nodule. Recommend thyroid [place] (Ref: J Am Coll Radiol. 2015 Feb;12(2): 143-50). 3. Advanced changes of emphysema. 4. Aortic Atherosclerosis (ICD10-I70.0) and Emphysema (ICD10-J43.9). Electronically Signed   By: EMisty StanleyM.D.   On: 06/25/2021 07:22    Cardiac Studies   Cath: 02/02/21   Conclusions: Severe single-vessel coronary artery disease with chronic total occlusion of mid RCA. Mild to moderate, diffuse LAD disease with competitive flow in the distal vessel from LIMA-LAD. Small but patent LIMA-LAD. Patent SVG-rPDA-rPL with diffuse ostial/proximal disease of up to 60% and tandem hazy 95% and 90% mid graft lesions. Successful PCI to ostial/proximal and mid SVG-rPDA-rPL using non-overlapping Resolute Onyx 3.0 x 26 mm and 3.5 x 34 mm drug-eluting stents with 0% residual stenosis and TIMI-3  flow. Left forearm hematoma following sheath removal ant TR band placement, controlled with manual compression and placement of second TR band proximal to the first band.   Recommendations: Dual antiplatelet therapy with aspirin and ticagrelor for at least 12 months. Aggressive secondary prevention. Close monitoring of left forearm hematoma.   CNelva Bush MD [facility]   Diagnostic Dominance: Right      Intervention      Echo: 01/31/21   IMPRESSIONS     1. Left ventricular ejection fraction, by estimation, is 60 to 65%. The  left ventricle has normal function. The left ventricle has no regional  wall motion abnormalities. Left ventricular diastolic parameters are  consistent with Grade I diastolic  dysfunction (impaired relaxation).   2. Right ventricular systolic function is normal. The right ventricular  size is normal. There is normal pulmonary artery systolic pressure.   3. Left atrial size was severely dilated.   4. The mitral valve is grossly normal.  Mild mitral valve regurgitation:  Apperance of two jets; study may underestimated regurgitation severity.   5. The aortic valve is tricuspid. Aortic valve regurgitation is not  visualized. No aortic stenosis is present.   6. The inferior vena cava is normal in size with greater than 50%  respiratory variability, suggesting right atrial pressure of 3 mmHg.   Comparison(s): A prior study was performed on 01/09/21. No significant  change from prior study. Prior images reviewed side by side. Similar to  prior.    Patient Profile     75 y.o. female with a hx of CAD s/p CABG '95 with subsequent NSTEMI and stenting to SVG-RCA, RAS with stenting '08, AAA with stent graft '09, s/p left iliac stenting, HTN and HLD who is being seen 06/25/2021 for the evaluation of chest pain at the request of Dr. [place] Crane.  Assessment & Plan    Unstable angina with hx of CABG and stenting: s/p CABG in 1995. Did well until Feb 2022 when she  presented with NSTEMI and had 2 stents to SVG to RCA. LIMA patent. presents with 2 days of persistent chest pain. Tried reflux meds and SL nitro prior to presenting to the ED. Describes as an "ache-like" sensation in the left side of her chest which is the same as her prior cardiac episodes. hsTn are negative x3, EKG initially with nonspecific ST changes and TWI in lead III but now with marked T wave inversion inferolaterally.  No chest pain at present. -- Given recurrent chest pain, dynamic Ecg changes and prior stenting of a 75 yo vein graft I feel she needs invasive cardiac cardiac cath today. She is in agreement.  -- continue ASA/Effient, statin, BB, losartan and HCTZ -- now on Imdur   2. HTN: blood pressures initially elevated on admission. Now improved -- continue BB, ARB/HCTZ   3. HLD: on high dose statin LDL 73.      For questions or updates, please contact [place] Please consult [email or web address] for contact info under        Signed, Jaylianna Tatlock [place], MD  06/26/2021, 7:50 AM

## 2021-06-26 NOTE — Progress Notes (Signed)
PROGRESS NOTE  Megan [name] [id number] DOB: 1946/02/11 DOA: 06/24/2021 PCP: [name], MD  HPI/Recap of past 24 hours:  Megan [name] is a 75 y.o. female with medical history significant of  renal a stenosis s/p stent 2008, abdominal aortic aneursym s/p stent 2009, PAD s/p left iliac stent HTN,HLD. Patient also has most significant history of CAD s/p MI s/p CABG 1995,NSTEMI 2/26-02/03/21 with peak Ce fo 1085 with ekg showing significant inferolateral TWA. Patient underwent cardiac cath which noted patent LIMA to the LAD. The native RCA was occluded and she had severe sequential lesions in the SVG to RCA which was treated with overlapping DES x 2. Patient was discharge on DAPT with ASA and Brilinta and continued on metoprolol and high dose statin.Patient was later switched to Effient due to intolerance related to [place]. Patient presents to ED with complaints of typical chest pain described as aching . Patient notes that chest discomfort has been ongoing for the last 1-2 days and initially was relieved with nitro but today patient took 3 nitro without relief so she presented to the ED.She notes chest pain is not made worse with exertion and is not associated with radiation, n/v/ sob, palpitations ,cough uri signs and symptoms or presyncope. Patient does not that she was diagnosed with bacterial bronchitis 1 weeks ago and complete antibiotics recently. She states she noted no aching in her chest with her symptoms of bronchitis and notes that her bronchitic symptoms have resolved. Patient currently states that s/p treatment in ed and placement of nitropaste her pain has improved.    Seen in the ED.  She continues to have left-sided chest pain.  States constant, achy, like a tooth ache.  Heart score 4.  Cardiology has been consulted for persistent chest pain to rule out ACS.   Additionally, she was incidentally found to have a 2.1 cm right thyroid nodule.  She was aware of this and  states it has been noted in the past.  There is radiology recommendation for thyroid ultrasound.  TSH and free T4 have been ordered in are pending.      06/26/21: Seen and examined at bedside.  She feels a lot better today than she did yesterday.  Seen by cardiology.  Plan for heart cath on 06/26/2021.  Assessment/Plan: Active Problems:   Unstable angina (HCC)   Chest pain  Chest pain r/o acs Presented with left-sided chest pain which she describes as ache like sensation similar to prior cardiac episodes. Seen by her cardiologist Dr. Martinique. With dynamic EKG changes. Plan for Cath Lab on 06/26/2021. Management per cardiology Continue aspirin/Effient, statin, beta-blocker, losartan, Endo and HCTZ as recommended by cardiology. Continue to closely monitor on telemetry.   Abn UA -no symptoms -await culture data    HTN BP is not at goal, elevated. Continue regimen as recommended by cardiology. Continue to closely monitor vital signs. IV antihypertensives as needed with parameters.   HLD -continue with statin   Chronic anxiety Resume home regimen.   DVT prophylaxis: Subcu heparin 3 times daily. Code Status: full code Family Communication: None at bedside. Disposition Plan: Likely discharge to home once cardiology signs off. Consults called: Cardiology    Procedures: Heart cath on 06/26/2021.  Antimicrobials: None  DVT prophylaxis: Subcu heparin 3 times daily.  Status is: Inpatient    Dispo:  Patient From: Home  Planned Disposition: Home when cardiology signs off.  Medically stable for discharge: No  Objective: Vitals:   06/26/21 1555 06/26/21 1559 06/26/21 1605 06/26/21 1637  BP: (!) 155/65 (!) 152/61 (!) 151/70 (!) 155/63  Pulse: 65 89 75   Resp: (!) '22 14 14 '$ (!) 22  Temp:    97.8 F (36.6 C)  TempSrc:    Oral  SpO2: 98% 100% 99% 94%  Weight:      Height:        Intake/Output Summary (Last 24 hours) at 06/26/2021 1835 Last data filed at  06/26/2021 1700 Gross per 24 hour  Intake 401.15 ml  Output 0 ml  Net 401.15 ml   Filed Weights   06/25/21 0203 06/25/21 1442 06/26/21 0948  Weight: 53.5 kg 54.2 kg 54.2 kg    Exam:  General: 75 y.o. year-old female well developed well nourished in no acute distress.  Alert and oriented x3. Cardiovascular: Regular rate and rhythm with no rubs or gallops.  No thyromegaly or JVD noted.   Respiratory: Clear to auscultation with no wheezes or rales. Good inspiratory effort. Abdomen: Soft nontender nondistended with normal bowel sounds x4 quadrants. Musculoskeletal: No lower extremity edema. 2/4 pulses in all 4 extremities. Skin: No ulcerative lesions noted or rashes, Psychiatry: Mood is appropriate for condition and setting   Data Reviewed: CBC: Recent Labs  Lab 06/24/21 1948 06/25/21 0408  WBC 13.3* 13.2*  HGB 15.0 14.5  HCT 45.8 43.8  MCV 89.3 89.2  PLT 307 [id number]   Basic Metabolic Panel: Recent Labs  Lab 06/24/21 1948 06/25/21 0408  NA 136  --   K 3.6  --   CL 103  --   CO2 23  --   GLUCOSE 173*  --   BUN 12  --   CREATININE 0.71 0.59  CALCIUM 9.7  --    GFR: Estimated Creatinine Clearance: 43 mL/min (by C-G formula based on SCr of 0.59 mg/dL). Liver Function Tests: No results for input(s): AST, ALT, ALKPHOS, BILITOT, PROT, ALBUMIN in the last 168 hours. No results for input(s): LIPASE, AMYLASE in the last 168 hours. No results for input(s): AMMONIA in the last 168 hours. Coagulation Profile: No results for input(s): INR, PROTIME in the last 168 hours. Cardiac Enzymes: No results for input(s): CKTOTAL, CKMB, CKMBINDEX, TROPONINI in the last 168 hours. BNP (last 3 results) No results for input(s): PROBNP in the last 8760 hours. HbA1C: No results for input(s): HGBA1C in the last 72 hours. CBG: No results for input(s): GLUCAP in the last 168 hours. Lipid Profile: Recent Labs    06/25/21 0408  CHOL 142  HDL 49  LDLCALC 73  TRIG 100  CHOLHDL 2.9    Thyroid Function Tests: No results for input(s): TSH, T4TOTAL, FREET4, T3FREE, THYROIDAB in the last 72 hours. Anemia Panel: No results for input(s): VITAMINB12, FOLATE, FERRITIN, TIBC, IRON, RETICCTPCT in the last 72 hours. Urine analysis:    Component Value Date/Time   COLORURINE AMBER (A) 01/05/2021 2349   APPEARANCEUR HAZY (A) 01/05/2021 2349   LABSPEC 1.012 01/05/2021 2349   PHURINE 5.0 01/05/2021 2349   GLUCOSEU NEGATIVE 01/05/2021 2349   GLUCOSEU NEGATIVE 09/09/2015 1611   HGBUR MODERATE (A) 01/05/2021 2349   BILIRUBINUR NEGATIVE 01/05/2021 2349   BILIRUBINUR negative 01/13/2018 [place] 01/05/2021 2349   PROTEINUR 30 (A) 01/05/2021 2349   UROBILINOGEN 0.2 01/13/2018 1131   UROBILINOGEN 0.2 09/09/2015 1611   NITRITE NEGATIVE 01/05/2021 2349   LEUKOCYTESUR LARGE (A) 01/05/2021 2349   Sepsis Labs: '@LABRCNTIP'$ (procalcitonin:4,lacticidven:4)  ) Recent Results (from the past  240 hour(s))  SARS CORONAVIRUS 2 (TAT 6-24 HRS) Nasopharyngeal Nasopharyngeal Swab     Status: None   Collection Time: 06/25/21  1:54 AM   Specimen: Nasopharyngeal Swab  Result Value Ref Range Status   SARS Coronavirus 2 NEGATIVE NEGATIVE Final    Comment: (NOTE) SARS-CoV-2 target nucleic acids are NOT DETECTED.  The SARS-CoV-2 RNA is generally detectable in upper and lower respiratory specimens during the acute phase of infection. Negative results do not preclude SARS-CoV-2 infection, do not rule out co-infections with other pathogens, and should not be used as the sole basis for treatment or other patient management decisions. Negative results must be combined with clinical observations, patient history, and epidemiological information. The expected result is Negative.  Fact Sheet for Patients: SugarRoll.be  Fact Sheet for Healthcare Providers: https://www.woods-mathews.com/  This test is not yet approved or cleared by the [place] FDA and  has been authorized for detection and/or diagnosis of SARS-CoV-2 by FDA under an Emergency Use Authorization (EUA). This EUA will remain  in effect (meaning this test can be used) for the duration of the COVID-19 declaration under Se ction 564(b)(1) of the Act, 21 U.S.C. section 360bbb-3(b)(1), unless the authorization is terminated or revoked sooner.  Performed at [facility], [place] [address], [place], [place] [address]       Studies: CARDIAC CATHETERIZATION  Result Date: 06/26/2021 Formatting of this result is different from the original.   Dist LM lesion is 15% stenosed.   Prox RCA lesion is 75% stenosed.   Mid RCA to Dist RCA lesion is 100% stenosed with 100% stenosed side branch in RPAV.   Origin to Prox Graft lesion before RPDA  is 80% stenosed.   Non-stenotic Mid Graft-1 lesion before RPDA was previously treated.   Non-stenotic Mid Graft-2 lesion before RPDA was previously treated.   A drug-eluting stent was successfully placed using a STENT ONYX FRONTIER 3.5X26.   Post intervention, there is a 20% residual stenosis.   Seq SVG- and is large.   LIMA and is small.   There is competitive flow. 1.  Stable native vessel coronary artery disease with chronic total occlusion of the proximal to mid RCA, patency of the left main, diffuse mild to moderate calcific nonobstructive LAD stenosis, and moderate nonobstructive proximal left circumflex stenosis 2.  Continued patency of the LIMA to LAD 3.  Continued patency of the sequential saphenous vein graft to PDA and posterolateral branches with severe in-stent restenosis in the proximal stent, both lesions likely related to stent fracture 4.  Successful intravascular ultrasound-guided PCI of the saphenous vein graft to RCA using a 3.5 x 26 mm resolute Onyx DES postdilated to high pressure with a 4.0 mm noncompliant balloon.    Scheduled Meds:  aspirin EC  81 mg Oral Daily   atorvastatin  80 mg Oral Daily   gabapentin  300 mg  Oral BID   heparin  5,000 Units Subcutaneous Q8H   losartan  50 mg Oral Daily   And   hydrochlorothiazide  12.5 mg Oral Daily   isosorbide mononitrate  15 mg Oral Daily   metoprolol tartrate  25 mg Oral BID   pantoprazole  40 mg Oral Daily   prasugrel  5 mg Oral Daily   sodium chloride flush  3 mL Intravenous Q12H   [START ON 06/27/2021] sodium chloride flush  3 mL Intravenous Q12H    Continuous Infusions:  [START ON 06/27/2021] sodium chloride     sodium chloride 1  mL/kg/hr (06/26/21 1642)     LOS: 0 days     Kayleen Memos, MD [facility] Pager [phone]  If 7PM-7AM, please contact night-coverage [email or web address] Password [facility] 06/26/2021, 6:35 PM

## 2021-06-27 LAB — CBC
HCT: 38.3 % (ref 36.0–46.0)
Hemoglobin: 12.9 g/dL (ref 12.0–15.0)
MCH: 30 pg (ref 26.0–34.0)
MCHC: 33.7 g/dL (ref 30.0–36.0)
MCV: 89.1 fL (ref 80.0–100.0)
Platelets: 262 10*3/uL (ref 150–400)
RBC: 4.3 MIL/uL (ref 3.87–5.11)
RDW: 14.8 % (ref 11.5–15.5)
WBC: 9.7 10*3/uL (ref 4.0–10.5)
nRBC: 0 % (ref 0.0–0.2)

## 2021-06-27 LAB — BASIC METABOLIC PANEL
Anion gap: 4 — ABNORMAL LOW (ref 5–15)
BUN: 13 mg/dL (ref 8–23)
CO2: 25 mmol/L (ref 22–32)
Calcium: 9.6 mg/dL (ref 8.9–10.3)
Chloride: 108 mmol/L (ref 98–111)
Creatinine, Ser: 0.69 mg/dL (ref 0.44–1.00)
GFR, Estimated: >60 mL/min (ref >60)
Glucose, Bld: 113 mg/dL — ABNORMAL HIGH (ref 70–99)
Potassium: 3.8 mmol/L (ref 3.5–5.1)
Sodium: 137 mmol/L (ref 135–145)

## 2021-06-27 LAB — TSH: TSH: 0.714 u[IU]/mL (ref 0.350–4.500)

## 2021-06-27 LAB — T4, FREE: Free T4: 1.05 ng/dL (ref 0.61–1.12)

## 2021-06-27 MED ORDER — DICLOFENAC SODIUM 75 MG PO TBEC: 75.0000 mg | DELAYED_RELEASE_TABLET | Freq: Two times a day (BID) | ORAL | 0 refills | Status: DC | PRN

## 2021-06-27 MED ORDER — ISOSORBIDE MONONITRATE ER 30 MG PO TB24: 15.0000 mg | ORAL_TABLET | Freq: Every day | ORAL | 0 refills | Status: DC

## 2021-06-27 MED ORDER — LOSARTAN POTASSIUM 50 MG PO TABS: 50.0000 mg | ORAL_TABLET | Freq: Every day | ORAL | 0 refills | Status: DC

## 2021-06-27 MED ORDER — LIDOCAINE 5 % EX PTCH: 1.0000 | MEDICATED_PATCH | Freq: Two times a day (BID) | CUTANEOUS | 0 refills | Status: DC

## 2021-06-27 NOTE — Discharge Summary (Signed)
Discharge Summary  Megan [name] [id number] DOB: Apr 21, 1946  PCP: [name], MD  Admit date: 06/24/2021 Discharge date: 06/27/2021  Time spent: 35 minutes  Recommendations for Outpatient Follow-up:  Follow-up with your cardiologist Follow-up with your PCP  Discharge Diagnoses:  Active Hospital Problems   Diagnosis Date Noted   Chest pain 06/25/2021   Unstable angina ([place]) 06/09/2013    Resolved Hospital Problems  No resolved problems to display.    Discharge Condition: Stable  Diet recommendation: Resume previous diet  Vitals:   06/27/21 0541 06/27/21 0800  BP: (!) 136/49   Pulse: 66 70  Resp: 18 (!) 22  Temp: 97.9 F (36.6 C)   SpO2: 93% 94%    History of present illness:   Megan [name] is a 75 y.o. female with medical history significant of  renal a stenosis s/p stent 2008, abdominal aortic aneursym s/p stent 2009, PAD s/p left iliac stent HTN,HLD. Patient also has most significant history of CAD s/p MI s/p CABG 1995,NSTEMI 2/26-02/03/21 with peak Ce fo 1085 with ekg showing significant inferolateral TWA. Patient underwent cardiac cath which noted patent LIMA to the LAD. The native RCA was occluded and she had severe sequential lesions in the SVG to RCA which was treated with overlapping DES x 2. Patient was discharge on DAPT with ASA and Brilinta and continued on metoprolol and high dose statin.Patient was later switched to Effient due to intolerance related to [place]. Patient presents to ED with complaints of typical chest pain described as aching . Patient notes that chest discomfort has been ongoing for the last 1-2 days and initially was relieved with nitro but today patient took 3 nitro without relief so she presented to the ED.She notes chest pain is not made worse with exertion and is not associated with radiation, n/v/ sob, palpitations ,cough uri signs and symptoms or presyncope. Patient does not that she was diagnosed with bacterial  bronchitis 1 weeks ago and complete antibiotics recently. She states she noted no aching in her chest with her symptoms of bronchitis and notes that her bronchitic symptoms have resolved. Patient currently states that s/p treatment in ed and placement of nitropaste her pain has improved.    Seen in the ED.  She continues to have left-sided chest pain.  States constant, achy, like a tooth ache.  Heart score 4.  Cardiology has been consulted for persistent chest pain to rule out ACS.   Additionally, she was incidentally found to have a 2.1 cm right thyroid nodule.  She was aware of this and states it has been noted in the past.  There is radiology recommendation for thyroid ultrasound.  TSH and free T4 within normal limit.  Post heart cath on 06/26/2021: LM 15%, moderate diffuse LAD disease, LCX patent, RCA prox 75% and mid CTO. LIMA-LAD patent though small, SVG-RPDA 80% origin with prior stent ISR and also thrombotic. Received PCI to SVG-RCA.  Cardiology recommendations:  medical therapy with imdur 15, losartan 50, asa 81, atorva 80, lopressor '25mg'$  bid, effient '5mg'$  (maintence dose for under 60kg). Had SOB previusly on brillinta and allergic reaction to plavix. -interventional recs for aspirin and prasugrel indefinite.   06/27/21: Seen at bedside.  No new complaints.  She is eager to go home.  Hospital Course:  Active Problems:   Unstable angina (HCC)   Chest pain  Chest pain r/o acs Presented with left-sided chest pain which she describes as ache like sensation similar to prior cardiac episodes. Seen by  her cardiologist Dr. Martinique. With dynamic EKG changes. Heart Cath on 06/26/2021, findings as stated above. Follow cardiology's recommendations. Follow-up with your cardiologist.   HTN Continue regimen as recommended by cardiology. Follow-up with cardiology   HLD -continue with statin   Chronic anxiety Resume home regimen.    Code Status: full code  Consults called: Cardiology       Procedures: Heart cath on 06/26/2021.   Antimicrobials: None     Discharge Exam: BP (!) 136/49 (BP Location: Left Arm)   Pulse 70   Temp 97.9 F (36.6 C) (Oral)   Resp (!) 22   Ht 5' (1.524 m)   Wt 54.2 kg   SpO2 94%   BMI 23.32 kg/m  General: 75 y.o. year-old female well developed well nourished in no acute distress.  Alert and oriented x3. Cardiovascular: Regular rate and rhythm with no rubs or gallops.  No thyromegaly or JVD noted.   Respiratory: Clear to auscultation with no wheezes or rales. Good inspiratory effort. Abdomen: Soft nontender nondistended with normal bowel sounds x4 quadrants. Musculoskeletal: No lower extremity edema. 2/4 pulses in all 4 extremities. Skin: No ulcerative lesions noted or rashes, Psychiatry: Mood is appropriate for condition and setting  Discharge Instructions You were cared for by a hospitalist during your hospital stay. If you have any questions about your discharge medications or the care you received while you were in the hospital after you are discharged, you can call the unit and asked to speak with the hospitalist on call if the hospitalist that took care of you is not available. Once you are discharged, your primary care physician will handle any further medical issues. Please note that NO REFILLS for any discharge medications will be authorized once you are discharged, as it is imperative that you return to your primary care physician (or establish a relationship with a primary care physician if you do not have one) for your aftercare needs so that they can reassess your need for medications and monitor your lab values.  Discharge Instructions     Amb Referral to Cardiac Rehabilitation   Complete by: As directed    Diagnosis: Coronary Stents   After initial evaluation and assessments completed: Virtual Based Care may be provided alone or in conjunction with Phase 2 Cardiac Rehab based on patient barriers.: Yes      Allergies as of  06/27/2021       Reactions   Anticoagulant Compound Itching, Other (See Comments)   Hands and feet tingle and itch.   Bextra [valdecoxib] Other (See Comments)   Increased lft's   Hydrocod Polst-cpm Polst Er Rash   Lipitor [atorvastatin] Other (See Comments)   Leg weakness   Mevacor [lovastatin] Other (See Comments)   Muscle weakness   Plavix [clopidogrel Bisulfate] Itching, Other (See Comments)   Hands and feet tingle and itch   Zocor [simvastatin] Other (See Comments)   Bones hurt   Doxycycline Diarrhea   N/v/d   Metoprolol Other (See Comments)   Hair loss   Morphine And Related Itching, Nausea Only   Codeine Itching, Rash   Hyper-active   Lisinopril Cough        Medication List     STOP taking these medications    azithromycin 250 MG tablet Commonly known as: ZITHROMAX   Brilinta 90 MG Tabs tablet Generic drug: ticagrelor   losartan-hydrochlorothiazide 100-25 MG tablet Commonly known as: HYZAAR   nystatin 100000 UNIT/ML suspension Commonly known as: MYCOSTATIN   saccharomyces boulardii 250  MG capsule Commonly known as: FLORASTOR   simethicone 80 MG chewable tablet Commonly known as: MYLICON       TAKE these medications    Accu-Chek FastClix Lancets Misc Check blood sugar once daily PRN What changed:  how much to take how to take this when to take this   Accu-Chek SmartView test strip Generic drug: glucose blood CHECK BLOOD SUGAR ONCE DAILY AS NEEDED   albuterol 108 (90 Base) MCG/ACT inhaler Commonly known as: VENTOLIN HFA Inhale 2 puffs into the lungs every 4 (four) hours as needed for wheezing or shortness of breath.   ALPRAZolam 0.25 MG tablet Commonly known as: XANAX Take 1 tablet (0.25 mg total) by mouth at bedtime as needed for sleep.   aspirin EC 81 MG tablet Take 1 tablet (81 mg total) by mouth daily.   atorvastatin 80 MG tablet Commonly known as: LIPITOR TAKE 1 TABLET BY MOUTH EVERY DAY   B-100 COMPLEX PO Take 1 tablet by  mouth at bedtime.   Breztri Aerosphere 160-9-4.8 MCG/ACT Aero Generic drug: Budeson-Glycopyrrol-Formoterol Inhale 2 puffs into the lungs in the morning and at bedtime. What changed:  how much to take when to take this reasons to take this   Cordran 4 MCG/SQCM Tape Generic drug: Flurandrenolide Apply 1 each topically daily as needed (Rash).   cycloSPORINE 0.05 % ophthalmic emulsion Commonly known as: RESTASIS Place 1 drop into both eyes 2 (two) times daily.   diclofenac 75 MG EC tablet Commonly known as: VOLTAREN Take 1 tablet (75 mg total) by mouth 2 (two) times daily as needed. What changed: reasons to take this   gabapentin 300 MG capsule Commonly known as: NEURONTIN Take 1-2 capsules (300-600 mg total) by mouth 3 (three) times daily. What changed:  how much to take when to take this   guaiFENesin-codeine 100-10 MG/5ML syrup Take 5 mLs by mouth every 6 (six) hours as needed for cough.   hydrOXYzine 25 MG tablet Commonly known as: ATARAX/VISTARIL Take 1 tablet (25 mg total) by mouth 3 (three) times daily as needed. What changed: reasons to take this   isosorbide mononitrate 30 MG 24 hr tablet Commonly known as: IMDUR Take 0.5 tablets (15 mg total) by mouth daily. Start taking on: June 28, 2021   lidocaine 5 % Commonly known as: Lidoderm Place 1 patch onto the skin every 12 (twelve) hours. Remove & Discard patch within 12 hours or as directed by MD   losartan 50 MG tablet Commonly known as: COZAAR Take 1 tablet (50 mg total) by mouth daily. Start taking on: June 28, 2021   metoprolol tartrate 25 MG tablet Commonly known as: LOPRESSOR Take 1 tablet (25 mg total) by mouth 2 (two) times daily. What changed: Another medication with the same name was removed. Continue taking this medication, and follow the directions you see here.   multivitamin with minerals tablet Take 1 tablet by mouth at bedtime.   nitroGLYCERIN 0.4 MG SL tablet Commonly known as:  NITROSTAT Place 1 tablet (0.4 mg total) under the tongue every 5 (five) minutes as needed for chest pain.   omeprazole 40 MG capsule Commonly known as: PRILOSEC Take 1 capsule (40 mg total) by mouth in the morning and at bedtime.   ondansetron 8 MG tablet Commonly known as: ZOFRAN Take by mouth every 8 (eight) hours as needed for nausea or vomiting.   oxyCODONE-acetaminophen 10-325 MG tablet Commonly known as: PERCOCET Take 1 tablet by mouth in the morning and at bedtime.  potassium chloride 10 MEQ tablet Commonly known as: KLOR-CON Take 1 tablet (10 mEq total) by mouth 2 (two) times daily.   prasugrel 10 MG Tabs tablet Commonly known as: EFFIENT Take 1/2 tablet ( 5 Mg ) daily What changed:  how much to take how to take this when to take this additional instructions   torsemide 20 MG tablet Commonly known as: DEMADEX Take 0.5 tablets (10 mg total) by mouth daily as needed (fluid).   tretinoin 0.1 % cream Commonly known as: RETIN-A Apply 1 application topically every other day. In the evening   VITAMIN D (ERGOCALCIFEROL) PO Take 1,000 Units by mouth every evening.       Allergies  Allergen Reactions   Anticoagulant Compound Itching and Other (See Comments)    Hands and feet tingle and itch.    Bextra [Valdecoxib] Other (See Comments)    Increased lft's    Hydrocod Polst-Cpm Polst Er Rash   Lipitor [Atorvastatin] Other (See Comments)    Leg weakness    Mevacor [Lovastatin] Other (See Comments)    Muscle weakness    Plavix [Clopidogrel Bisulfate] Itching and Other (See Comments)    Hands and feet tingle and itch   Zocor [Simvastatin] Other (See Comments)    Bones hurt    Doxycycline Diarrhea    N/v/d   Metoprolol Other (See Comments)    Hair loss   Morphine And Related Itching and Nausea Only   Codeine Itching and Rash    Hyper-active   Lisinopril Cough    Follow-up Information     Deberah Pelton, NP Follow up on 07/22/2021.   Specialty:  Cardiology Why: Cardiology Hospital Follow-up on 07/22/2021 at 2:15 PM with Coletta Memos, NP (works with Dr. Martinique) Contact information: [address] [place] [address] [phone]         [name], MD. Call in 1 day(s).   Specialty: Family Medicine Why: Please call for a posthospital follow-up appointment. Contact information: [place] [address] [place] [address] [phone]         Martinique, Peter M, MD .   Specialty: Cardiology Contact information: [address] [place]  [address] [phone]                  The results of significant diagnostics from this hospitalization (including imaging, microbiology, ancillary and laboratory) are listed below for reference.    Significant Diagnostic Studies: DG Chest 2 View  Result Date: 06/24/2021 CLINICAL DATA:  Chest pain EXAM: CHEST - 2 VIEW COMPARISON:  02/11/2021 FINDINGS: Post sternotomy changes. No consolidation or effusion. Mild atelectasis right base. Mitral annular calcification. Normal heart size. Aortic atherosclerosis. No pneumothorax. Incompletely visualized abdominal aortic stent and surgical hardware in the lumbar spine IMPRESSION: No active cardiopulmonary disease. Electronically Signed   By: Donavan Foil M.D.   On: 06/24/2021 20:03   CT Angio Chest Pulmonary Embolism (PE) W or WO Contrast  Result Date: 06/25/2021 CLINICAL DATA:  Shortness of breath with chest pain. EXAM: CT ANGIOGRAPHY CHEST WITH CONTRAST TECHNIQUE: Multidetector CT imaging of the chest was performed using the standard protocol during bolus administration of intravenous contrast. Multiplanar CT image reconstructions and MIPs were obtained to evaluate the vascular anatomy. CONTRAST:  82m OMNIPAQUE IOHEXOL 350 MG/ML SOLN COMPARISON:  None. FINDINGS: Cardiovascular: Heart size upper normal. Status post CABG. Moderate atherosclerotic calcification is noted in the wall of the thoracic  aorta. No large central pulmonary embolus in the pulmonary  outflow tract or main pulmonary arteries. No lobar pulmonary embolus evident. Assessment of segmental and subsegmental pulmonary arteries to the lower lobes is markedly degraded by substantial motion artifact. Mediastinum/Nodes: No mediastinal lymphadenopathy. 2.1 cm right thyroid nodule evident. There is no hilar lymphadenopathy. The esophagus has normal imaging features. There is no axillary lymphadenopathy. Lungs/Pleura: Advanced changes of centrilobular emphysema noted bilaterally. Fine architectural detail the lungs obscured by motion artifact. Calcified granuloma noted posterior left upper lobe. No overtly suspicious nodule or mass. No focal airspace consolidation. There is no evidence of pleural effusion. Upper Abdomen: Unremarkable. Musculoskeletal: No worrisome lytic or sclerotic osseous abnormality. Review of the MIP images confirms the above findings. IMPRESSION: 1. No large central pulmonary embolus in the pulmonary outflow tract or main pulmonary arteries. Assessment of segmental and subsegmental pulmonary arteries to the lower lobes is unreliable due to substantial motion artifact. 2. 2.1 cm right thyroid nodule. Recommend thyroid US. (Ref: J Am Coll Radiol. 2015 Feb;12(2): 143-50). 3. Advanced changes of emphysema. 4. Aortic Atherosclerosis (ICD10-I70.0) and Emphysema (ICD10-J43.9). Electronically Signed   By: Misty Stanley M.D.   On: 06/25/2021 07:22   CARDIAC CATHETERIZATION  Result Date: 06/26/2021 Formatting of this result is different from the original.   Dist LM lesion is 15% stenosed.   Prox RCA lesion is 75% stenosed.   Mid RCA to Dist RCA lesion is 100% stenosed with 100% stenosed side branch in RPAV.   Origin to Prox Graft lesion before RPDA  is 80% stenosed.   Non-stenotic Mid Graft-1 lesion before RPDA was previously treated.   Non-stenotic Mid Graft-2 lesion before RPDA was previously treated.   A drug-eluting stent was  successfully placed using a STENT ONYX FRONTIER [id number].   Post intervention, there is a 20% residual stenosis.   Seq SVG- and is large.   LIMA and is small.   There is competitive flow. 1.  Stable native vessel coronary artery disease with chronic total occlusion of the proximal to mid RCA, patency of the left main, diffuse mild to moderate calcific nonobstructive LAD stenosis, and moderate nonobstructive proximal left circumflex stenosis 2.  Continued patency of the LIMA to LAD 3.  Continued patency of the sequential saphenous vein graft to PDA and posterolateral branches with severe in-stent restenosis in the proximal stent, both lesions likely related to stent fracture 4.  Successful intravascular ultrasound-guided PCI of the saphenous vein graft to RCA using a 3.5 x 26 mm resolute Onyx DES postdilated to high pressure with a 4.0 mm noncompliant balloon.    Microbiology: Recent Results (from the past 240 hour(s))  SARS CORONAVIRUS 2 (TAT 6-24 HRS) Nasopharyngeal Nasopharyngeal Swab     Status: None   Collection Time: 06/25/21  1:54 AM   Specimen: Nasopharyngeal Swab  Result Value Ref Range Status   SARS Coronavirus 2 NEGATIVE NEGATIVE Final    Comment: (NOTE) SARS-CoV-2 target nucleic acids are NOT DETECTED.  The SARS-CoV-2 RNA is generally detectable in upper and lower respiratory specimens during the acute phase of infection. Negative results do not preclude SARS-CoV-2 infection, do not rule out co-infections with other pathogens, and should not be used as the sole basis for treatment or other patient management decisions. Negative results must be combined with clinical observations, patient history, and epidemiological information. The expected result is Negative.  Fact Sheet for Patients: SugarRoll.be  Fact Sheet for Healthcare Providers: https://www.woods-mathews.com/  This test is not yet approved or cleared by the [place] FDA and   has been authorized for  detection and/or diagnosis of SARS-CoV-2 by FDA under an Emergency Use Authorization (EUA). This EUA will remain  in effect (meaning this test can be used) for the duration of the COVID-19 declaration under Se ction 564(b)(1) of the Act, 21 U.S.C. section 360bbb-3(b)(1), unless the authorization is terminated or revoked sooner.  Performed at [facility], [address], [place], [place] [address]      Labs: Basic Metabolic Panel: Recent Labs  Lab 06/24/21 1948 06/25/21 0408 06/27/21 0351  NA 136  --  137  K 3.6  --  3.8  CL 103  --  108  CO2 23  --  25  GLUCOSE 173*  --  113*  BUN 12  --  13  CREATININE 0.71 0.59 0.69  CALCIUM 9.7  --  9.6   Liver Function Tests: No results for input(s): AST, ALT, ALKPHOS, BILITOT, PROT, ALBUMIN in the last 168 hours. No results for input(s): LIPASE, AMYLASE in the last 168 hours. No results for input(s): AMMONIA in the last 168 hours. CBC: Recent Labs  Lab 06/24/21 1948 06/25/21 0408 06/27/21 0351  WBC 13.3* 13.2* 9.7  HGB 15.0 14.5 12.9  HCT 45.8 43.8 38.3  MCV 89.3 89.2 89.1  PLT 307 283 262   Cardiac Enzymes: No results for input(s): CKTOTAL, CKMB, CKMBINDEX, TROPONINI in the last 168 hours. BNP: BNP (last 3 results) Recent Labs    01/08/21 1630  BNP 862.1*    ProBNP (last 3 results) No results for input(s): PROBNP in the last 8760 hours.  CBG: No results for input(s): GLUCAP in the last 168 hours.     Signed:  Kayleen Memos, MD [facility] 06/27/2021, 6:25 PM

## 2021-06-27 NOTE — Progress Notes (Signed)
CARDIAC REHAB PHASE I   PRE:  Rate/Rhythm: 63 SR  BP:  Supine:   Sitting: 122/97  Standing:    SaO2: 94% RA  MODE:  Ambulation: 470 ft   POST:  Rate/Rhythm: 75 SR  BP:  Supine:   Sitting: 142/51  Standing:    SaO2: 95% RA  [id number] Patient very pleasant and eager to walk. Tolerated ambulation well with assist x1. Patient c/o left sided achy feeling before and after walk, which she feels is musculoskeletal and can be relieved with movement/stretching. BP elevated prior to walk but within normal limits after ambulation. Reviewed stent education including restrictions, risk factor modification, and activity progression. Patient verbalizes understanding. Discussed phase 2 cardiac rehab, and patient doesn't think she will participate but is fine with referral being sent to program at [facility].   Sol Passer, MS, ACSM CEP

## 2021-06-27 NOTE — Progress Notes (Signed)
Progress Note  Patient Name: Megan [name] Date of Encounter: 06/27/2021  [facility] Cardiologist: [name], MD   Subjective   No complaints this AM.   Inpatient Medications    Scheduled Meds:  aspirin EC  81 mg Oral Daily   atorvastatin  80 mg Oral Daily   gabapentin  300 mg Oral BID   heparin  5,000 Units Subcutaneous Q8H   losartan  50 mg Oral Daily   And   hydrochlorothiazide  12.5 mg Oral Daily   isosorbide mononitrate  15 mg Oral Daily   metoprolol tartrate  25 mg Oral BID   pantoprazole  40 mg Oral Daily   prasugrel  5 mg Oral Daily   sodium chloride flush  3 mL Intravenous Q12H   sodium chloride flush  3 mL Intravenous Q12H   Continuous Infusions:  sodium chloride     PRN Meds: sodium chloride, acetaminophen, hydrOXYzine, ondansetron (ZOFRAN) IV, oxyCODONE-acetaminophen **AND** oxyCODONE, sodium chloride flush   Vital Signs    Vitals:   06/26/21 2055 06/26/21 2212 06/27/21 0541 06/27/21 0800  BP: (!) 115/43 (!) 125/45 (!) 136/49   Pulse: (!) 59 62 66 70  Resp: 16  18 (!) 22  Temp: 98.5 F (36.9 C)  97.9 F (36.6 C)   TempSrc: Oral  Oral   SpO2: 97%  93% 94%  Weight:      Height:        Intake/Output Summary (Last 24 hours) at 06/27/2021 1005 Last data filed at 06/26/2021 1700 Gross per 24 hour  Intake 281.15 ml  Output --  Net 281.15 ml   Last 3 Weights 06/26/2021 06/25/2021 06/25/2021  Weight (lbs) 119 lb 6.4 oz 119 lb 6.4 oz 118 lb  Weight (kg) 54.159 kg 54.159 kg 53.524 kg      Telemetry    NSR - Personally Reviewed  ECG    N/a - Personally Reviewed  Physical Exam   GEN: No acute distress.   Neck: No JVD Cardiac: RRR, no murmurs, rubs, or gallops.  Respiratory: Clear to auscultation bilaterally. GI: Soft, nontender, non-distended  MS: No edema; No deformity. Neuro:  Nonfocal  Psych: Normal affect   Labs    High Sensitivity Troponin:   Recent Labs  Lab 06/24/21 1948 06/24/21 2226 06/25/21 0149 06/25/21 0408  06/25/21 0611  TROPONINIHS '5 6 6 6 6      '$ Chemistry Recent Labs  Lab 06/24/21 1948 06/25/21 0408 06/27/21 0351  NA 136  --  137  K 3.6  --  3.8  CL 103  --  108  CO2 23  --  25  GLUCOSE 173*  --  113*  BUN 12  --  13  CREATININE 0.71 0.59 0.69  CALCIUM 9.7  --  9.6  GFRNONAA >60 >60 >60  ANIONGAP 10  --  4*     Hematology Recent Labs  Lab 06/24/21 1948 06/25/21 0408 06/27/21 0351  WBC 13.3* 13.2* 9.7  RBC 5.13* 4.91 4.30  HGB 15.0 14.5 12.9  HCT 45.8 43.8 38.3  MCV 89.3 89.2 89.1  MCH 29.2 29.5 30.0  MCHC 32.8 33.1 33.7  RDW 14.3 14.4 14.8  PLT 307 283 262    BNPNo results for input(s): BNP, PROBNP in the last 168 hours.   DDimer  Recent Labs  Lab 06/25/21 0408  DDIMER 3.00*     Radiology    CARDIAC CATHETERIZATION  Result Date: 06/26/2021 Formatting of this result is different from the original.  Dist LM lesion is 15% stenosed.   Prox RCA lesion is 75% stenosed.   Mid RCA to Dist RCA lesion is 100% stenosed with 100% stenosed side Camran Keady in RPAV.   Origin to Prox Graft lesion before RPDA  is 80% stenosed.   Non-stenotic Mid Graft-1 lesion before RPDA was previously treated.   Non-stenotic Mid Graft-2 lesion before RPDA was previously treated.   A drug-eluting stent was successfully placed using a STENT ONYX FRONTIER 3.5X26.   Post intervention, there is a 20% residual stenosis.   Seq SVG- and is large.   LIMA and is small.   There is competitive flow. 1.  Stable native vessel coronary artery disease with chronic total occlusion of the proximal to mid RCA, patency of the left main, diffuse mild to moderate calcific nonobstructive LAD stenosis, and moderate nonobstructive proximal left circumflex stenosis 2.  Continued patency of the LIMA to LAD 3.  Continued patency of the sequential saphenous vein graft to PDA and posterolateral branches with severe in-stent restenosis in the proximal stent, both lesions likely related to stent fracture 4.  Successful  intravascular ultrasound-guided PCI of the saphenous vein graft to RCA using a 3.5 x 26 mm resolute Onyx DES postdilated to high pressure with a 4.0 mm noncompliant balloon.    Cardiac Studies    Patient Profile     75 y.o. female with a hx of CAD s/p CABG '95 with subsequent NSTEMI and stenting to SVG-RCA, RAS with stenting '08, AAA with stent graft '09, s/p left iliac stenting, HTN and HLD who is being seen 06/25/2021 for the evaluation of chest pain at the request of Dr. Nevada Crane.  Assessment & Plan    CAD/unstable angina -history of CABG in 1995 - NSTEMI 01/2021, stents to SVG-RCA x 2 - trops neg this admission but some ischemic EKG changes, pain similar to her prior angina from MI -cath this admit LM 15%, moderate diffuse LAD disease, LCX patent, RCA prox 75% and mid CTO. LIMA-LAD patent though small, SVG-RPDA 80% origin with prior stent ISR and also thrombotic. Received PCI to SVG-RCA  -medical therapy with imdur 15, losartan 50, asa 81, atorva 80, lopressor '25mg'$  bid, effient '5mg'$  (maintence dose for under 60kg). Had SOB previusly on brillinta and allergic reaction to plavix.  -interventional recs for aspirin and prasugrel indefinite.   - symptoms have resolved this AM. Some residual EKG changes this AM inferior TWIs, in absence of symptoms would just continue medical therapy at this time.      For questions or updates, please contact [facility] Please consult [email or web address] for contact info under        Signed, Carlyle Dolly, MD  06/27/2021, 10:05 AM

## 2021-06-29 ENCOUNTER — Encounter (HOSPITAL_COMMUNITY): Payer: Self-pay | Admitting: Cardiovascular Disease

## 2021-06-29 ENCOUNTER — Telehealth: Payer: Self-pay

## 2021-06-29 NOTE — Telephone Encounter (Cosign Needed)
Transition Care Management Follow-up Telephone Call Date of discharge and from where: [facility] 06/27/21 How have you been since you were released from the hospital? Feeling good  Any questions or concerns? No  Items Reviewed: Did the pt receive and understand the discharge instructions provided? Yes  Medications obtained and verified? Yes  Other? No  Any new allergies since your discharge? No  Dietary orders reviewed? Yes Do you have support at home? Yes   Home Care and Equipment/Supplies: Were home health services ordered? not applicable If so, what is the name of the agency?   Has the agency set up a time to come to the patient's home? not applicable Were any new equipment or medical supplies ordered?  No What is the name of the medical supply agency?  Were you able to get the supplies/equipment? not applicable Do you have any questions related to the use of the equipment or supplies? No  Functional Questionnaire: (I = Independent and D = Dependent) ADLs: I  Bathing/Dressing- I  Meal Prep- I  Eating- I  Maintaining continence- I  Transferring/Ambulation- I  Managing Meds- I  Follow up appointments reviewed:  PCP Hospital f/u appt confirmed? Yes  Scheduled to see Dr Jonni Sanger  on 07/06/21 @ 3:00 p.m. [place] [facility] f/u appt confirmed? Yes  Scheduled to see CVD/ Dr Coletta Memos  on 07/22/21 @ 2:15 p.m . Are transportation arrangements needed? No  If their condition worsens, is the pt aware to call PCP or go to the Emergency Dept.? Yes Was the patient provided with contact information for the PCP's office or ED? Yes Was to pt encouraged to call back with questions or concerns? Yes

## 2021-06-30 DIAGNOSIS — L821 Other seborrheic keratosis: Secondary | ICD-10-CM | POA: Diagnosis not present

## 2021-06-30 DIAGNOSIS — D692 Other nonthrombocytopenic purpura: Secondary | ICD-10-CM | POA: Diagnosis not present

## 2021-07-01 ENCOUNTER — Telehealth (HOSPITAL_COMMUNITY): Payer: Self-pay

## 2021-07-01 NOTE — Telephone Encounter (Signed)
Attempted to call patient in regards to Cardiac Rehab - LM on VM 

## 2021-07-01 NOTE — Telephone Encounter (Signed)
Pt returned CR phone call and stated she is not interested at this time.   Closed referral

## 2021-07-01 NOTE — Telephone Encounter (Signed)
Pt insurance is active and benefits verified through [facility] Medicare. Co-pay $0.00, DED $0.00/$0.00 met, out of pocket $3,600.00/$189.51 met, co-insurance 0%. No pre-authorization required. Passport, 07/01/21 @ 1:39PM, EHM#[id number]   Will contact patient to see if she is interested in the Cardiac Rehab Program. If interested, patient will need to complete follow up appt. Once completed, patient will be contacted for scheduling upon review by the RN Navigator.

## 2021-07-02 ENCOUNTER — Other Ambulatory Visit: Payer: Self-pay

## 2021-07-02 ENCOUNTER — Telehealth: Payer: Self-pay

## 2021-07-02 MED ORDER — ISOSORBIDE MONONITRATE ER 30 MG PO TB24: 30.0000 mg | ORAL_TABLET | Freq: Every day | ORAL | 3 refills | Status: DC

## 2021-07-02 NOTE — Telephone Encounter (Signed)
See my chart note.

## 2021-07-02 NOTE — Telephone Encounter (Signed)
Spoke to patient Dr.Jordan's advice given.Advised to increase Imdur to 30 mg daily.Advised if she has any more chest pain she will need to go to ED.

## 2021-07-02 NOTE — Telephone Encounter (Signed)
I would not recommend repeat cath at this point unless symptoms really accelerate. She was DC on Imdur 15 mg daily. I would increase to 30 mg daily. Continue to monitor. If symptoms worsen I think ED evaluation is appropriate. Hopefully things will settle down for her.  Myrl Lazarus Martinique MD, [facility]

## 2021-07-02 NOTE — Telephone Encounter (Signed)
Spoke to patient she stated she was discharged from hospital this past Sat 7/23.Stated she started having a dull ache in chest yesterday that lasted most of the day.Stated when she woke up this morning she felt ok.Stated she is now having a dull ache in chest again, but not as bad as yesterday.Advised she needs to go to ED.Stated she did not want to go.Stated she wanted Dr.Jordan to schedule a cardiac cath.Advised Dr.Jordan is out of office today. I will send message to him.Advised again she needs to go to ED to be evaluated.

## 2021-07-06 ENCOUNTER — Ambulatory Visit (INDEPENDENT_AMBULATORY_CARE_PROVIDER_SITE_OTHER): Payer: Medicare Other | Admitting: Family Medicine

## 2021-07-06 ENCOUNTER — Other Ambulatory Visit: Payer: Self-pay

## 2021-07-06 ENCOUNTER — Encounter: Payer: Self-pay | Admitting: Family Medicine

## 2021-07-06 VITALS — BP 138/60 | HR 71 | Temp 98.5°F | Wt 126.2 lb

## 2021-07-06 DIAGNOSIS — I209 Angina pectoris, unspecified: Secondary | ICD-10-CM

## 2021-07-06 DIAGNOSIS — Z95820 Peripheral vascular angioplasty status with implants and grafts: Secondary | ICD-10-CM | POA: Diagnosis not present

## 2021-07-06 DIAGNOSIS — I25708 Atherosclerosis of coronary artery bypass graft(s), unspecified, with other forms of angina pectoris: Secondary | ICD-10-CM | POA: Diagnosis not present

## 2021-07-06 DIAGNOSIS — I10 Essential (primary) hypertension: Secondary | ICD-10-CM | POA: Diagnosis not present

## 2021-07-06 NOTE — Progress Notes (Signed)
Subjective  CC:  Chief Complaint  Patient presents with   Hospitalization Follow-up    Chest pain.Chest ache returned last week, cardiologist advised in ER, but she did not go. Stents placed during heart cath. 1 new, 1 replacement.  Has spoken to cardiologist multiple times since discharge. Increased Imdur to 30 mg daily   Hypertension    Hospital stopped losartan-HTCZ, concerns over fluid retention. Up 2 lbs this week    HPI: Megan [name] is a 75 y.o. female who presents to the office today to address the problems listed above in the chief complaint. Hospital f/u: Patient was admitted 7/20-7/23.  I reviewed hospital records.  Admitted for chest pain.  Anginal symptoms persisted, cardiac catheterization was done and showed a blockage in one of her prior stents.  This was repaired.  Since being home she is done very well however she has had some heart ache due to angina the last 48 hours.  Reviewed cardiology follow-up.  He increase Imdur to 60 mg daily.  She is feeling a bit better.  She continues to walk about a mile per day but slowly.  She is doing this in the early morning hours to avoid the heat.  She has no associated shortness of breath, nausea and but she has noticed some increasing lower extremity swelling.  With the addition of Imdur, they stopped the hydrochlorothiazide.  She continues on losartan.  She does have torsemide to use as needed for fluid retention.  She has not used this recently. I reviewed cardiac catheterization images and report.  Reviewed all lab work.  Discharge summary reviewed in detail.  She has follow-up with cardiology on August 17.  Assessment  1. Angina pectoris ([place])   2. Coronary artery disease of bypass graft of native heart with stable angina pectoris ([place])   3. Essential (primary) hypertension   4. Status post angioplasty with stent      Plan   Coronary disease with stable angina: Doing a bit better with increased dose of Imdur.  They can  adjust up as needed.  Continue current heart medicines.  No changes made today.  She does have some pitting edema.  Monitor weight gain, low-salt diet and intermittent use of torsemide.  Follow-up with cardiology.  They may want to reinstitute the diuretic.  She is tolerating the Imdur without hypotensive symptoms currently.  Education regarding management of these chronic disease states was given. Management strategies discussed on successive visits include dietary and exercise recommendations, goals of achieving and maintaining IBW, and lifestyle modifications aiming for adequate sleep and minimizing stressors.   Follow up: As scheduled  No orders of the defined types were placed in this encounter.  No orders of the defined types were placed in this encounter.     BP Readings from Last 3 Encounters:  07/06/21 138/60  06/27/21 (!) 136/49  06/19/21 130/66   Wt Readings from Last 3 Encounters:  07/06/21 126 lb 3.2 oz (57.2 kg)  06/26/21 119 lb 6.4 oz (54.2 kg)  06/19/21 121 lb 6.1 oz (55.1 kg)    Lab Results  Component Value Date   CHOL 142 06/25/2021   CHOL 156 04/20/2021   CHOL 160 06/13/2020   Lab Results  Component Value Date   HDL 49 06/25/2021   HDL 59 04/20/2021   HDL 51.10 06/13/2020   Lab Results  Component Value Date   LDLCALC 73 06/25/2021   LDLCALC 73 04/20/2021   LDLCALC 64 01/01/2020   Lab  Results  Component Value Date   TRIG 100 06/25/2021   TRIG 142 04/20/2021   TRIG 294.0 (H) 06/13/2020   Lab Results  Component Value Date   CHOLHDL 2.9 06/25/2021   CHOLHDL 2.6 04/20/2021   CHOLHDL 3 06/13/2020   Lab Results  Component Value Date   LDLDIRECT 82.0 06/13/2020   LDLDIRECT 122.0 01/25/2019   LDLDIRECT 87.4 12/09/2014   Lab Results  Component Value Date   CREATININE 0.69 06/27/2021   BUN 13 06/27/2021   NA 137 06/27/2021   K 3.8 06/27/2021   CL 108 06/27/2021   CO2 25 06/27/2021    The ASCVD Risk score Mikey Bussing DC Jr., et al., 2013) failed  to calculate for the following reasons:   The patient has a prior MI or stroke diagnosis  I reviewed the patients updated PMH, FH, and SocHx.    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    Priority: High   Coronary artery disease     Priority: High   Peripheral vascular disease with claudication ([place])     Priority: High   History of abdominal aortic aneurysm repair 05/18/2011    Priority: High   Hyperlipidemia LDL goal <70     Priority: High   History of compression fracture of spine - lumbar 06/19/2021    Priority: Medium   History of lumbosacral spine surgery 03/03/2021    Priority: Medium   Neuropathic pain 12/05/2020    Priority: Medium   Gait instability 09/09/2020    Priority: Medium   Lumbar stenosis without neurogenic claudication 02/26/2020    Priority: Medium   DJD (degenerative joint disease), lumbosacral 11/10/2017    Priority: Medium   Fibromyalgia 11/10/2017    Priority: Medium   GERD (gastroesophageal reflux disease) 11/10/2017    Priority: Medium   Fatty liver 11/10/2017    Priority: Medium   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    Priority: Medium   History of renal artery stenosis 05/18/2011    Priority: Medium   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/26/2021    Priority: Low   Dyshidrotic eczema 11/10/2017    Priority: Low   Osteopenia 11/10/2017    Priority: Low   Does use hearing aid 11/10/2017    Priority: Low   Chest pain 06/25/2021   Diarrhea 01/06/2021   Unstable angina ([place]) 06/09/2013    Allergies: Anticoagulant compound, Bextra [valdecoxib], Hydrocod polst-cpm polst er, Lipitor [atorvastatin], Mevacor  [lovastatin], Plavix [clopidogrel bisulfate], Zocor [simvastatin], Doxycycline, Metoprolol, Morphine and related, Codeine, and Lisinopril  Social History: Patient  reports that she quit smoking about 25 years ago. Her smoking use included cigarettes. She has never used smokeless tobacco. She reports current alcohol use of about 6.0 - 10.0 standard drinks of alcohol per week. She reports that she does not use drugs.  Current Meds  Medication Sig   Accu-Chek FastClix Lancets MISC Check blood sugar once daily PRN   albuterol (PROVENTIL HFA;VENTOLIN HFA) 108 (90 Base) MCG/ACT inhaler Inhale 2 puffs into the lungs every 4 (four) hours as needed for wheezing or shortness of breath.   ALPRAZolam (XANAX) 0.25 MG tablet Take 1 tablet (0.25 mg total) by mouth  at bedtime as needed for sleep.   aspirin EC 81 MG tablet Take 1 tablet (81 mg total) by mouth daily.   atorvastatin (LIPITOR) 80 MG tablet TAKE 1 TABLET BY MOUTH EVERY DAY   B Complex-Biotin-FA (B-100 COMPLEX PO) Take 1 tablet by mouth at bedtime.    Budeson-Glycopyrrol-Formoterol (BREZTRI AEROSPHERE) 160-9-4.8 MCG/ACT AERO Inhale 2 puffs into the lungs in the morning and at bedtime.   CORDRAN 4 MCG/SQCM TAPE Apply 1 each topically daily as needed (Rash).    cycloSPORINE (RESTASIS) 0.05 % ophthalmic emulsion Place 1 drop into both eyes 2 (two) times daily.    diclofenac (VOLTAREN) 75 MG EC tablet Take 1 tablet (75 mg total) by mouth 2 (two) times daily as needed.   gabapentin (NEURONTIN) 300 MG capsule Take 1-2 capsules (300-600 mg total) by mouth 3 (three) times daily.   glucose blood (ACCU-CHEK SMARTVIEW) test strip CHECK BLOOD SUGAR ONCE DAILY AS NEEDED   guaiFENesin-codeine 100-10 MG/5ML syrup Take 5 mLs by mouth every 6 (six) hours as needed for cough.   hydrOXYzine (ATARAX/VISTARIL) 25 MG tablet Take 1 tablet (25 mg total) by mouth 3 (three) times daily as needed.   isosorbide mononitrate (IMDUR) 30 MG 24 hr tablet Take 1 tablet (30 mg total)  by mouth daily.   lidocaine (LIDODERM) 5 % Place 1 patch onto the skin every 12 (twelve) hours. Remove & Discard patch within 12 hours or as directed by MD   losartan (COZAAR) 50 MG tablet Take 1 tablet (50 mg total) by mouth daily.   metoprolol tartrate (LOPRESSOR) 25 MG tablet Take 1 tablet (25 mg total) by mouth 2 (two) times daily.   Multiple Vitamins-Minerals (MULTIVITAMIN WITH MINERALS) tablet Take 1 tablet by mouth at bedtime.   nitroGLYCERIN (NITROSTAT) 0.4 MG SL tablet Place 1 tablet (0.4 mg total) under the tongue every 5 (five) minutes as needed for chest pain.   omeprazole (PRILOSEC) 40 MG capsule Take 1 capsule (40 mg total) by mouth in the morning and at bedtime.   ondansetron (ZOFRAN) 8 MG tablet Take by mouth every 8 (eight) hours as needed for nausea or vomiting.   oxyCODONE-acetaminophen (PERCOCET) 10-325 MG tablet Take 1 tablet by mouth in the morning and at bedtime.   potassium chloride (KLOR-CON) 10 MEQ tablet Take 1 tablet (10 mEq total) by mouth 2 (two) times daily.   prasugrel (EFFIENT) 10 MG TABS tablet Take 1/2 tablet ( 5 Mg ) daily   torsemide (DEMADEX) 20 MG tablet Take 0.5 tablets (10 mg total) by mouth daily as needed (fluid).   tretinoin (RETIN-A) 0.1 % cream Apply 1 application topically every other day. In the evening   VITAMIN D, ERGOCALCIFEROL, PO Take 1,000 Units by mouth every evening.     Review of Systems: Cardiovascular: negative for chest pain, palpitations, leg swelling, orthopnea Respiratory: negative for SOB, wheezing or persistent cough Gastrointestinal: negative for abdominal pain Genitourinary: negative for dysuria or gross hematuria  Objective  Vitals: BP 138/60   Pulse 71   Temp 98.5 F (36.9 C) (Temporal)   Wt 126 lb 3.2 oz (57.2 kg)   SpO2 97%   BMI 24.65 kg/m  General: no acute distress  Psych:  Alert and oriented, normal mood and affect HEENT:  Normocephalic, atraumatic, supple neck  Cardiovascular:  RRR with systolic murmur, 2+  pitting edema bilateral lower extremities  respiratory:  Good breath sounds bilaterally, CTAB with normal respiratory effort, no rales Skin:  Warm, no rashes Neurologic:   Mental  status is normal Commons side effects, risks, benefits, and alternatives for medications and treatment plan prescribed today were discussed, and the patient expressed understanding of the given instructions. Patient is instructed to call or message via MyChart if he/she has any questions or concerns regarding our treatment plan. No barriers to understanding were identified. We discussed Red Flag symptoms and signs in detail. Patient expressed understanding regarding what to do in case of urgent or emergency type symptoms.  Medication list was reconciled, printed and provided to the patient in AVS. Patient instructions and summary information was reviewed with the patient as documented in the AVS. This note was prepared with assistance of Dragon voice recognition software. Occasional wrong-word or sound-a-like substitutions may have occurred due to the inherent limitations of voice recognition software  This visit occurred during the SARS-CoV-2 public health emergency.  Safety protocols were in place, including screening questions prior to the visit, additional usage of staff PPE, and extensive cleaning of exam room while observing appropriate contact time as indicated for disinfecting solutions.

## 2021-07-21 NOTE — Progress Notes (Signed)
Cardiology Clinic Note   Patient Name: Megan [name] Date of Encounter: 07/22/2021  Primary Care Provider:  [name], MD Primary Cardiologist:  [name], MD  Patient Profile    Megan [name] 75 year old female presents to clinic today for follow-up evaluation of her chest discomfort.  Past Medical History    Past Medical History:  Diagnosis Date   AAA (abdominal aortic aneurysm) ([place])    a. s/p stent grafting in 2009.   Adenomatous colon polyp 05/2001   Adenomatous duodenal polyp    Allergy    Anginal pain (HCC)    COPD (chronic obstructive pulmonary disease) ([place])    pt denies COPD   Coronary artery disease    a. s/p CABG in 1995;  b. 05/2013 Neg MV, EF 82%;  c. 06/2013 Cath: LM nl, LAD 40-50p, LCX nl, RCA 80p/132m VG->RCA->PDA->PLSA min irregs, LIMA->LAD atretic, vigorous LV fxn.   Diabetes mellitus without complication (HCC)    history of, resolved. 2017   Diverticulosis    Eczema, dyshidrotic    Eczema, dyshidrotic 1986   Dr. HMathis Fare  Emphysema of lung ([facility]    Fatty liver    Fibromyalgia    GERD (gastroesophageal reflux disease)    Hyperlipidemia    Hypertension    Migraine    Myocardial infarction ([facility]    Osteoarthritis    Osteoporosis    unsure, having a bone scan soon   Peripheral arterial disease ([place]    left leg diagnosed by Dr BMare Ferrari  Renal artery stenosis ([facility]    a. 2008 s/p PTA   Past Surgical History:  Procedure Laterality Date   ABDOMINAL AORTAGRAM N/A 03/06/2014   Procedure: ABDOMINAL AORTAGRAM;  Surgeon: TRosetta Posner MD;  [facility];  Service: Cardiovascular;  Laterality: N/A;   ABDOMINAL AORTIC ANEURYSM REPAIR  2009   stenting   ABDOMINAL HYSTERECTOMY     Total, 1992   BREAST BIOPSY     CARDIAC CATHETERIZATION     CARDIOVASCULAR STRESS TEST  11/11/2009   normal study   CARPAL TUNNEL RELEASE  08/1993   left wrist   CARPAL TUNNEL RELEASE  01/1997   right   CATARACT EXTRACTION, BILATERAL  2021    CORONARY ARTERY BYPASS GRAFT  07/1994   Triple by Dr ECeasar Mons  CORONARY STENT INTERVENTION N/A 02/02/2021   Procedure: CORONARY STENT INTERVENTION;  Surgeon: ENelva Bush MD;  [facility];  Service: Cardiovascular;  Laterality: N/A;   CORONARY STENT INTERVENTION N/A 06/26/2021   Procedure: CORONARY STENT INTERVENTION;  Surgeon: CSherren Mocha MD;  [place] [facility] [place];  Service: Cardiovascular;  Laterality: N/A;   dental implants     EYE SURGERY  03/2020   cataract   HAND SURGERY Right 09/2002   Right hand pulley release   INTRAVASCULAR ULTRASOUND/IVUS N/A 06/26/2021   Procedure: Intravascular Ultrasound/IVUS;  Surgeon: CSherren Mocha MD;  [facility];  Service: Cardiovascular;  Laterality: N/A;   LEFT HEART CATH AND CORONARY ANGIOGRAPHY N/A 06/26/2021   Procedure: LEFT HEART CATH AND CORONARY ANGIOGRAPHY;  Surgeon: CSherren Mocha MD;  [place] [place];  Service: Cardiovascular;  Laterality: N/A;   LEFT HEART CATH AND CORS/GRAFTS ANGIOGRAPHY N/A 02/02/2021   Procedure: LEFT HEART CATH AND CORS/GRAFTS ANGIOGRAPHY;  Surgeon: ENelva Bush MD;  [place] [facility];  Service: Cardiovascular;  Laterality: N/A;   LEFT HEART CATHETERIZATION WITH CORONARY/GRAFT ANGIOGRAM N/A 06/11/2013   Procedure:  LEFT HEART CATHETERIZATION WITH [name];  Surgeon: Thayer Headings, MD;  [facility];  Service: Cardiovascular;  Laterality: N/A;   MULTIPLE TOOTH EXTRACTIONS  2000   All upper teeth.  Has full dneture.    RENAL ARTERY STENT  2008   SHOULDER ARTHROSCOPY WITH ROTATOR CUFF REPAIR Right 09/27/2018   Procedure: Right shoulder mini open rotator cuff repair;  Surgeon: Susa Day, MD;  Location: [facility];  Service: Orthopedics;  Laterality: Right;  90 mins   thyroid cyst apiration  05/1997 and 07/1998   TONSILECTOMY, ADENOIDECTOMY, BILATERAL MYRINGOTOMY AND TUBES  1989   TONSILLECTOMY     TOTAL ABDOMINAL  HYSTERECTOMY  1992    Allergies  Allergies  Allergen Reactions   Anticoagulant Compound Itching and Other (See Comments)    Hands and feet tingle and itch.    Bextra [Valdecoxib] Other (See Comments)    Increased lft's    Hydrocod Polst-Cpm Polst Er Rash   Lipitor [Atorvastatin] Other (See Comments)    Leg weakness    Mevacor [Lovastatin] Other (See Comments)    Muscle weakness    Plavix [Clopidogrel Bisulfate] Itching and Other (See Comments)    Hands and feet tingle and itch   Zocor [Simvastatin] Other (See Comments)    Bones hurt    Doxycycline Diarrhea    N/v/d   Metoprolol Other (See Comments)    Hair loss   Morphine And Related Itching and Nausea Only   Codeine Itching and Rash    Hyper-active   Lisinopril Cough    History of Present Illness    Megan [name] has a PMH of coronary artery disease status post CABG in 1995 with subsequent NSTEMI stenting of her SVG-RCA, cardiac arrest with stenting 2018, AAA with graft 2009, status post left iliac stenting, hypertension, and hyperlipidemia.  Underwent cardiac catheterization with PCI to her SVG-RCA with DES x1.  She previously had shortness of breath on Brilinta and allergic reaction to Plavix.  She was placed on aspirin and Effient.  She was discharged in stable condition on 06/27/2021.  She contacted nurse triage line on 07/02/2021.  Dr. Martinique recommended repeat cardiac catheterization.  It was recommended that her Imdur be increased to 30 mg daily.  Recommendation for ED evaluation if symptoms worsen was also recommended.  She presents to the clinic today for follow-up evaluation states she feels much better since increasing her Imdur medication.  She reports that she has had just a couple twinges of chest discomfort since her medication increase.  She does notice nighttime sweating as well.  We reviewed her medications and she has not had any new additions to her regimen.  I will continue her Imdur and other  medications.  We reviewed her angiography and she expressed understanding.  She does report some daytime somnolence.  I will order a split-night sleep study, give her the salty 6 diet sheet, have her increase her physical activity as tolerated, and follow-up as scheduled with Dr. Martinique.  Today she denies chest pain, shortness of breath, lower extremity edema, fatigue, palpitations, melena, hematuria, hemoptysis, diaphoresis, weakness, presyncope, syncope, and orthopnea.   Home Medications    Prior to Admission medications   Medication Sig Start Date End Date Taking? Authorizing Provider  Accu-Chek FastClix Lancets MISC Check blood sugar once daily PRN 07/30/19   [name], MD  albuterol (PROVENTIL HFA;VENTOLIN HFA) 108 (90 Base) MCG/ACT inhaler Inhale 2 puffs into the lungs every 4 (four) hours as needed for  wheezing or shortness of breath. 11/13/18   [name], MD  ALPRAZolam Duanne Moron) 0.25 MG tablet Take 1 tablet (0.25 mg total) by mouth at bedtime as needed for sleep. 09/15/20   Martinique, Peter M, MD  aspirin EC 81 MG tablet Take 1 tablet (81 mg total) by mouth daily. 05/09/20   Martinique, Peter M, MD  atorvastatin (LIPITOR) 80 MG tablet TAKE 1 TABLET BY MOUTH EVERY DAY 08/18/20   Martinique, Peter M, MD  B Complex-Biotin-FA (B-100 COMPLEX PO) Take 1 tablet by mouth at bedtime.     [provider]  Budeson-Glycopyrrol-Formoterol (BREZTRI AEROSPHERE) 160-9-4.8 MCG/ACT AERO Inhale 2 puffs into the lungs in the morning and at bedtime. 02/27/20   [name], MD  CORDRAN 4 MCG/SQCM TAPE Apply 1 each topically daily as needed (Rash).  05/17/19   [provider]  cycloSPORINE (RESTASIS) 0.05 % ophthalmic emulsion Place 1 drop into both eyes 2 (two) times daily.     [provider]  diclofenac (VOLTAREN) 75 MG EC tablet Take 1 tablet (75 mg total) by mouth 2 (two) times daily as needed. 06/27/21   Kayleen Memos, DO  gabapentin (NEURONTIN) 300 MG capsule Take 1-2 capsules  (300-600 mg total) by mouth 3 (three) times daily. 11/15/19   Lomax, Amy, NP  glucose blood (ACCU-CHEK SMARTVIEW) test strip CHECK BLOOD SUGAR ONCE DAILY AS NEEDED 01/17/20   [name], MD  guaiFENesin-codeine 100-10 MG/5ML syrup Take 5 mLs by mouth every 6 (six) hours as needed for cough. 06/19/21   [name], MD  hydrOXYzine (ATARAX/VISTARIL) 25 MG tablet Take 1 tablet (25 mg total) by mouth 3 (three) times daily as needed. 03/09/21   Martinique, Peter M, MD  isosorbide mononitrate (IMDUR) 30 MG 24 hr tablet Take 1 tablet (30 mg total) by mouth daily. 07/02/21   Martinique, Peter M, MD  lidocaine (LIDODERM) 5 % Place 1 patch onto the skin every 12 (twelve) hours. Remove & Discard patch within 12 hours or as directed by MD 06/27/21 06/27/22  Kayleen Memos, DO  losartan (COZAAR) 50 MG tablet Take 1 tablet (50 mg total) by mouth daily. 06/28/21 07/28/21  Kayleen Memos, DO  metoprolol tartrate (LOPRESSOR) 25 MG tablet Take 1 tablet (25 mg total) by mouth 2 (two) times daily. 02/18/21   Martinique, Peter M, MD  Multiple Vitamins-Minerals (MULTIVITAMIN WITH MINERALS) tablet Take 1 tablet by mouth at bedtime.    [provider]  nitroGLYCERIN (NITROSTAT) 0.4 MG SL tablet Place 1 tablet (0.4 mg total) under the tongue every 5 (five) minutes as needed for chest pain. 01/09/20   Martinique, Peter M, MD  omeprazole (PRILOSEC) 40 MG capsule Take 1 capsule (40 mg total) by mouth in the morning and at bedtime. 05/11/21   Ladene Artist, MD  ondansetron (ZOFRAN) 8 MG tablet Take by mouth every 8 (eight) hours as needed for nausea or vomiting.    [provider]  oxyCODONE-acetaminophen (PERCOCET) 10-325 MG tablet Take 1 tablet by mouth in the morning and at bedtime. 04/08/21   [provider]  potassium chloride (KLOR-CON) 10 MEQ tablet Take 1 tablet (10 mEq total) by mouth 2 (two) times daily. 05/26/21   Martinique, Peter M, MD  prasugrel (EFFIENT) 10 MG TABS tablet Take 1/2 tablet ( 5 Mg ) daily 03/09/21    Martinique, Peter M, MD  torsemide (DEMADEX) 20 MG tablet Take 0.5 tablets (10 mg total) by mouth daily as needed (fluid). 01/13/21  Rai, Ripudeep K, MD  tretinoin (RETIN-A) 0.1 % cream Apply 1 application topically every other day. In the evening    [provider]  VITAMIN D, ERGOCALCIFEROL, PO Take 1,000 Units by mouth every evening.     [provider]    Family History    Family History  Problem Relation Age of Onset   Heart disease Mother    Heart attack Mother    Heart disease Father        before age 15   Heart attack Father    Heart disease Sister        before age 43   Diabetes Sister    Hyperlipidemia Sister    Hypertension Sister    Heart attack Sister    Liver cancer Sister    Breast cancer Sister    Heart disease Brother    Diabetes Brother    Hyperlipidemia Brother    Hypertension Brother    Heart attack Brother    AAA (abdominal aortic aneurysm) Brother    Colon cancer Paternal Grandmother    Heart disease Brother    Colon cancer Cousin        first cousin on father's side   Pancreatic cancer Neg Hx    Rectal cancer Neg Hx    Stomach cancer Neg Hx    Esophageal cancer Neg Hx    She indicated that her mother is deceased. She indicated that her father is deceased. She indicated that her sister is alive. She indicated that only one of her two brothers is alive. She indicated that the status of her paternal grandmother is unknown. She indicated that the status of her cousin is unknown. She indicated that the status of her neg hx is unknown.  Social History    Social History   Socioeconomic History   Marital status: Married    Spouse name: Not on file   Number of children: Not on file   Years of education: Not on file   Highest education level: Not on file  Occupational History   Not on file  Tobacco Use   Smoking status: Former    Packs/day: 0.00    Years: 37.00    Pack years: 0.00    Types: Cigarettes    Quit date: 12/07/1995    Years  since quitting: 25.6   Smokeless tobacco: Never  Vaping Use   Vaping Use: Never used  Substance and Sexual Activity   Alcohol use: Yes    Alcohol/week: 6.0 - 10.0 standard drinks    Types: 6 - 10 Glasses of wine per week    Comment: 2-3 glasses daily   Drug use: No   Sexual activity: Not Currently    Partners: Male  Other Topics Concern   Not on file  Social History Narrative   Lives at home with husband.  Is retired.  Education 4 yrs of college.  No children.   Caffiene 2 cups daily.    Social Determinants of Health   Financial Resource Strain: Not on file  Food Insecurity: Not on file  Transportation Needs: Not on file  Physical Activity: Not on file  Stress: Not on file  Social Connections: Not on file  Intimate Partner Violence: Not on file     Review of Systems    General:  No chills, fever, night sweats or weight changes.  Cardiovascular:  No chest pain, dyspnea on exertion, edema, orthopnea, palpitations, paroxysmal nocturnal dyspnea. Dermatological: No rash, lesions/masses Respiratory: No cough,  dyspnea Urologic: No hematuria, dysuria Abdominal:   No nausea, vomiting, diarrhea, bright red blood per rectum, melena, or hematemesis Neurologic:  No visual changes, wkns, changes in mental status. All other systems reviewed and are otherwise negative except as noted above.  Physical Exam    VS:  BP 140/60 (BP Location: Left Arm, Patient Position: Sitting, Cuff Size: Normal)   Pulse 69   Ht '5\' 4"'$  (1.626 m)   Wt 127 lb 9.6 oz (57.9 kg)   BMI 21.90 kg/m  , BMI Body mass index is 21.9 kg/m. GEN: Well nourished, well developed, in no acute distress. HEENT: normal. Neck: Supple, no JVD, carotid bruits, or masses. Cardiac: RRR, no murmurs, rubs, or gallops. No clubbing, cyanosis, edema.  Radials/DP/PT 2+ and equal bilaterally.  Respiratory:  Respirations regular and unlabored, clear to auscultation bilaterally. GI: Soft, nontender, nondistended, BS + x 4. MS: no  deformity or atrophy. Skin: warm and dry, no rash. Neuro:  Strength and sensation are intact. Psych: Normal affect.  Accessory Clinical Findings    Recent Labs: 01/08/2021: B Natriuretic Peptide 862.1 03/16/2021: Magnesium 1.6 04/20/2021: ALT 27 06/27/2021: BUN 13; Creatinine, Ser 0.69; Hemoglobin 12.9; Platelets 262; Potassium 3.8; Sodium 137; TSH 0.714   Recent Lipid Panel    Component Value Date/Time   CHOL 142 06/25/2021 0408   CHOL 156 04/20/2021 0845   TRIG 100 06/25/2021 0408   HDL 49 06/25/2021 0408   HDL 59 04/20/2021 0845   CHOLHDL 2.9 06/25/2021 0408   VLDL 20 06/25/2021 0408   LDLCALC 73 06/25/2021 0408   LDLCALC 73 04/20/2021 0845   LDLDIRECT 82.0 06/13/2020 1017    ECG personally reviewed by me today-normal sinus rhythm T wave abnormality consider inferior ischemia 70 bpm- No acute changes  Cardiac catheterization 06/26/2021    Dist LM lesion is 15% stenosed.   Prox RCA lesion is 75% stenosed.   Mid RCA to Dist RCA lesion is 100% stenosed with 100% stenosed side branch in RPAV.   Origin to Prox Graft lesion before RPDA  is 80% stenosed.   Non-stenotic Mid Graft-1 lesion before RPDA was previously treated.   Non-stenotic Mid Graft-2 lesion before RPDA was previously treated.   A drug-eluting stent was successfully placed using a STENT ONYX FRONTIER 3.5X26.   Post intervention, there is a 20% residual stenosis.   Seq SVG- and is large.   LIMA and is small.   There is competitive flow.   1.  Stable native vessel coronary artery disease with chronic total occlusion of the proximal to mid RCA, patency of the left main, diffuse mild to moderate calcific nonobstructive LAD stenosis, and moderate nonobstructive proximal left circumflex stenosis 2.  Continued patency of the LIMA to LAD 3.  Continued patency of the sequential saphenous vein graft to PDA and posterolateral branches with severe in-stent restenosis in the proximal stent, both lesions likely related to stent  fracture 4.  Successful intravascular ultrasound-guided PCI of the saphenous vein graft to RCA using a 3.5 x 26 mm resolute Onyx DES postdilated to high pressure with a 4.0 mm noncompliant balloon. Diagnostic Dominance: Right Intervention  Implants      Assessment & Plan   1.  Coronary artery disease/unstable angina-no chest pain today.  Underwent repeat cardiac catheterization on 06/26/2021 and received PCI with DES to her SVG-RCA graft.  She was discharged in stable condition on 06/27/2021.  She contacted the nurse triage line on 07/02/2021 and reported that she was having chest discomfort.  Her  Imdur was increased to 30 mg daily Continue aspirin, atorvastatin, Imdur, losartan, metoprolol, nitroglycerin Heart healthy low-sodium diet-salty 6 given Increase physical activity as tolerated  Hyperlipidemia-06/25/2021: Cholesterol 142; HDL 49; LDL Cholesterol 73; Triglycerides 100; VLDL 20 Continue atorvastatin Heart healthy low-sodium high-fiber diet Increase physical activity as tolerated  Essential hypertension-BP today 140/60.  Well-controlled at home. Continue losartan, metoprolol, Heart healthy low-sodium diet-salty 6 given Increase physical activity as tolerated  Daytime somnolence-reports she occasionally has symptoms for PND.  Reports night sweats.  Several family members with sleep apnea. Order sleep study   Disposition: Follow-up with Dr. Martinique in 3 months.  Jossie Ng. Chaise Passarella NP-C    07/22/2021, 2:52 PM [facility] [address] Office [phone] Fax [phone]  Notice: This dictation was prepared with Dragon dictation along with smaller phrase technology. Any transcriptional errors that result from this process are unintentional and may not be corrected upon review.  I spent 14 minutes examining this patient, reviewing medications, and using patient centered shared decision making involving her cardiac care.  Prior to her visit I  spent greater than 20 minutes reviewing her past medical history,  medications, and prior cardiac tests.

## 2021-07-22 ENCOUNTER — Ambulatory Visit: Payer: Medicare Other | Admitting: General Practice

## 2021-07-22 ENCOUNTER — Encounter: Payer: Self-pay | Admitting: General Practice

## 2021-07-22 ENCOUNTER — Other Ambulatory Visit: Payer: Self-pay

## 2021-07-22 VITALS — BP 140/60 | HR 69 | Ht 64.0 in | Wt 127.6 lb

## 2021-07-22 DIAGNOSIS — Z9889 Other specified postprocedural states: Secondary | ICD-10-CM | POA: Diagnosis not present

## 2021-07-22 DIAGNOSIS — I25810 Atherosclerosis of coronary artery bypass graft(s) without angina pectoris: Secondary | ICD-10-CM | POA: Diagnosis not present

## 2021-07-22 DIAGNOSIS — R400 Somnolence: Secondary | ICD-10-CM

## 2021-07-22 DIAGNOSIS — E782 Mixed hyperlipidemia: Secondary | ICD-10-CM | POA: Diagnosis not present

## 2021-07-22 DIAGNOSIS — I119 Hypertensive heart disease without heart failure: Secondary | ICD-10-CM

## 2021-07-22 DIAGNOSIS — M461 Sacroiliitis, not elsewhere classified: Secondary | ICD-10-CM | POA: Diagnosis not present

## 2021-07-22 DIAGNOSIS — I10 Essential (primary) hypertension: Secondary | ICD-10-CM | POA: Diagnosis not present

## 2021-07-22 DIAGNOSIS — Z79899 Other long term (current) drug therapy: Secondary | ICD-10-CM | POA: Diagnosis not present

## 2021-07-22 MED ORDER — HYDROCHLOROTHIAZIDE 25 MG PO TABS: 25.0000 mg | ORAL_TABLET | Freq: Every day | ORAL | 3 refills | Status: DC

## 2021-07-22 NOTE — Patient Instructions (Addendum)
Medication Instructions:  STOP TORSEMIDE  RE-START HYDROCHLOROTHIAZYDE '25MG'$   *If you need a refill on your cardiac medications before your next appointment, please call your pharmacy*  Lab Work:    NON-FASTING BMET IN 1 WEEK   REFER FOR SLEEP STUDY  Special Instructions PLEASE READ AND FOLLOW SALTY 6-ATTACHED-1,'800mg'$  daily  PLEASE SLOWLY INCREASE PHYSICAL ACTIVITY AS TOLERATED  DISCUSS NIGHT SWEATS WITH YOUR  PRIMARY MD  Follow-Up: Your next appointment:  KEEP SCHEDULED APPOINTMENT  In Person with [name], MD   At [facility], you and your health needs are our priority.  As part of our continuing mission to provide you with exceptional heart care, we have created designated Provider Care Teams.  These Care Teams include your primary Cardiologist (physician) and Advanced Practice Providers (APPs -  Physician Assistants and Nurse Practitioners) who all work together to provide you with the care you need, when you need it.            6 SALTY THINGS TO AVOID     1,'800MG'$  DAILY

## 2021-07-24 ENCOUNTER — Encounter: Payer: Self-pay | Admitting: Cardiology

## 2021-07-24 NOTE — Telephone Encounter (Signed)
error 

## 2021-07-29 ENCOUNTER — Other Ambulatory Visit: Payer: Self-pay | Admitting: Cardiology

## 2021-07-29 ENCOUNTER — Telehealth: Payer: Self-pay | Admitting: General Practice

## 2021-07-29 NOTE — Telephone Encounter (Signed)
*  STAT* If patient is at the pharmacy, call can be transferred to refill team.   1. Which medications need to be refilled? (please list name of each medication and dose if known) *new prescription for Losartan 50 mg   2. Which pharmacy/location (including street and city if local pharmacy) is medication to be sent to? [facility] [address], [place]  3. Do they need a 30 day or 90 day supply? 90 days and refills

## 2021-08-04 DIAGNOSIS — R2681 Unsteadiness on feet: Secondary | ICD-10-CM | POA: Diagnosis not present

## 2021-08-04 DIAGNOSIS — I119 Hypertensive heart disease without heart failure: Secondary | ICD-10-CM | POA: Diagnosis not present

## 2021-08-04 DIAGNOSIS — E782 Mixed hyperlipidemia: Secondary | ICD-10-CM | POA: Diagnosis not present

## 2021-08-04 DIAGNOSIS — R400 Somnolence: Secondary | ICD-10-CM | POA: Diagnosis not present

## 2021-08-04 DIAGNOSIS — I10 Essential (primary) hypertension: Secondary | ICD-10-CM | POA: Diagnosis not present

## 2021-08-04 DIAGNOSIS — Z79899 Other long term (current) drug therapy: Secondary | ICD-10-CM | POA: Diagnosis not present

## 2021-08-04 DIAGNOSIS — I25810 Atherosclerosis of coronary artery bypass graft(s) without angina pectoris: Secondary | ICD-10-CM | POA: Diagnosis not present

## 2021-08-05 DIAGNOSIS — M7062 Trochanteric bursitis, left hip: Secondary | ICD-10-CM | POA: Diagnosis not present

## 2021-08-05 DIAGNOSIS — M7061 Trochanteric bursitis, right hip: Secondary | ICD-10-CM | POA: Diagnosis not present

## 2021-08-05 LAB — BASIC METABOLIC PANEL
BUN/Creatinine Ratio: 21 (ref 12–28)
BUN: 22 mg/dL (ref 8–27)
CO2: 26 mmol/L (ref 20–29)
Calcium: 9.8 mg/dL (ref 8.7–10.3)
Chloride: 102 mmol/L (ref 96–106)
Creatinine, Ser: 1.07 mg/dL — ABNORMAL HIGH (ref 0.57–1.00)
Glucose: 93 mg/dL (ref 65–99)
Potassium: 5.5 mmol/L — ABNORMAL HIGH (ref 3.5–5.2)
Sodium: 140 mmol/L (ref 134–144)
eGFR: 54 mL/min/{1.73_m2} — ABNORMAL LOW (ref >59)

## 2021-08-06 ENCOUNTER — Other Ambulatory Visit: Payer: Self-pay

## 2021-08-06 DIAGNOSIS — Z79899 Other long term (current) drug therapy: Secondary | ICD-10-CM

## 2021-08-06 DIAGNOSIS — E782 Mixed hyperlipidemia: Secondary | ICD-10-CM

## 2021-08-06 MED ORDER — HYDROCHLOROTHIAZIDE 12.5 MG PO TABS: 12.5000 mg | ORAL_TABLET | Freq: Every day | ORAL | 3 refills | Status: DC

## 2021-08-07 MED ORDER — LOSARTAN POTASSIUM 50 MG PO TABS
50.0000 mg | ORAL_TABLET | Freq: Every day | ORAL | 2 refills | Status: DC
Start: 2021-08-07 — End: 2022-03-15

## 2021-08-07 NOTE — Telephone Encounter (Signed)
Losartan 50 mg one tablet daily was e-sent to pharmacy 90 day supply with 2 refills

## 2021-08-11 ENCOUNTER — Other Ambulatory Visit: Payer: Self-pay | Admitting: Cardiology

## 2021-08-13 ENCOUNTER — Telehealth: Payer: Self-pay | Admitting: General Practice

## 2021-08-13 ENCOUNTER — Telehealth: Payer: Self-pay | Admitting: Cardiology

## 2021-08-13 DIAGNOSIS — E782 Mixed hyperlipidemia: Secondary | ICD-10-CM

## 2021-08-13 DIAGNOSIS — Z79899 Other long term (current) drug therapy: Secondary | ICD-10-CM

## 2021-08-13 NOTE — Telephone Encounter (Signed)
Returned call to patient who was calling in to see if she needed to have repeat blood work to recheck her potassium since discontinuing her potassium and decreasing her HCTZ to 12.'5mg'$ . Patient states she wasn't told if she should have it repeated or not. Advised patient that per result note with Coletta Memos, NP that she should have labs rechecked 7-10 days after her previous labs which were done on 8/31. Advised patient to return to office tomorrow for labs. Advised patient of lab instructions and patient verbalized understanding. Patient will return tomorrow for repeat blood work. Order has been placed. Advised patient to call back to office with any issues, questions, or concerns. Patient verbalized understanding.     Deberah Pelton, NP  08/05/2021  3:30 PM EDT     Please contact Ms. Gehman and let her know that her lab work from yesterday has been reviewed.  Her creatinine has increased to 1.07.  Please ask her to reduce her HCTZ to 12.5 mg daily.  Please ask her to stop her potassium supplementation.  Please discontinue this from her medication list.  She also needs to slightly increase her hydration.  We will repeat her BMP in a week to 10 days.  Thank you.   Will route to Coletta Memos, NP to make aware.

## 2021-08-13 NOTE — Telephone Encounter (Signed)
New Message:      Patient said she was told to stop her Potassium and cut her HCTZ in half. Her question is does she need yo have lab work to see if the adjustments worked?

## 2021-08-13 NOTE — Telephone Encounter (Signed)
New Message:     Patient called to see what is the status of her Sleep Studies.

## 2021-08-14 DIAGNOSIS — Z79899 Other long term (current) drug therapy: Secondary | ICD-10-CM | POA: Diagnosis not present

## 2021-08-14 DIAGNOSIS — E782 Mixed hyperlipidemia: Secondary | ICD-10-CM | POA: Diagnosis not present

## 2021-08-14 LAB — BASIC METABOLIC PANEL
BUN/Creatinine Ratio: 17 (ref 12–28)
BUN: 14 mg/dL (ref 8–27)
CO2: 27 mmol/L (ref 20–29)
Calcium: 10.3 mg/dL (ref 8.7–10.3)
Chloride: 99 mmol/L (ref 96–106)
Creatinine, Ser: 0.81 mg/dL (ref 0.57–1.00)
Glucose: 95 mg/dL (ref 65–99)
Potassium: 4.1 mmol/L (ref 3.5–5.2)
Sodium: 139 mmol/L (ref 134–144)
eGFR: 75 mL/min/{1.73_m2} (ref >59)

## 2021-08-19 ENCOUNTER — Telehealth: Payer: Self-pay

## 2021-08-19 ENCOUNTER — Other Ambulatory Visit: Payer: Self-pay | Admitting: *Deleted

## 2021-08-19 DIAGNOSIS — Z95828 Presence of other vascular implants and grafts: Secondary | ICD-10-CM

## 2021-08-19 NOTE — Telephone Encounter (Signed)
Pt called regarding a letter, she stated that she dropped off a letter and wants to know if Dr Jonni Sanger has received it.

## 2021-08-20 ENCOUNTER — Other Ambulatory Visit: Payer: Self-pay

## 2021-08-20 DIAGNOSIS — E041 Nontoxic single thyroid nodule: Secondary | ICD-10-CM

## 2021-08-20 NOTE — Telephone Encounter (Signed)
Spoke with patient regarding letter. We did receive it, Dr.Andy reviewed the letter and the CT scan that was mentioned in the letter. Per Dr.Andy order a thryoid ultrasound to see the thyroid nodule found in the CT. Will get ultrasound ordered.

## 2021-08-20 NOTE — Addendum Note (Signed)
Addended by: Gean Birchwood on: 08/20/2021 02:47 PM   Modules accepted: Orders

## 2021-08-21 NOTE — Telephone Encounter (Signed)
Left sleep study appointment details on patient's voice mail.

## 2021-08-24 ENCOUNTER — Other Ambulatory Visit: Payer: Medicare Other

## 2021-08-24 NOTE — Telephone Encounter (Signed)
Spoke to patient she stated she has been having a dull ache in chest this morning.She rates ache # 4 at present.Dr.Jordan is out of office this week.Advised she need to go to ED to be evaluated.

## 2021-08-25 ENCOUNTER — Telehealth: Payer: Self-pay

## 2021-08-25 DIAGNOSIS — H02835 Dermatochalasis of left lower eyelid: Secondary | ICD-10-CM | POA: Diagnosis not present

## 2021-08-25 DIAGNOSIS — H02832 Dermatochalasis of right lower eyelid: Secondary | ICD-10-CM | POA: Diagnosis not present

## 2021-08-25 NOTE — Telephone Encounter (Signed)
Spoke to patient about email she sent yesterday.Stated she did not go to ED as advised.Stated chest pain relieved after she took NTG X 2.Stated she has had 2 episodes of a dull ache in center of chest that radiates up into jaw and down left arm since she had stents 06/26/21.She wanted to ask Dr.Jordan if she should increase Isosorbide.She takes 30 mg daily.She also wanted a sooner appointment.I will send message to [place] for advice.

## 2021-08-26 ENCOUNTER — Ambulatory Visit
Admission: RE | Admit: 2021-08-26 | Discharge: 2021-08-26 | Disposition: A | Discharge status: Home / Self Care | Payer: Medicare Other | Source: Ambulatory Visit | Attending: Family Medicine | Admitting: Family Medicine

## 2021-08-26 DIAGNOSIS — E041 Nontoxic single thyroid nodule: Secondary | ICD-10-CM

## 2021-08-26 NOTE — Telephone Encounter (Signed)
She can increase Imdur to 60 mg daily. She does need to be seen sooner given high risk of restenosis. Can add her on for Monday for me. Thanks  Alexios Keown Martinique MD, [facility]

## 2021-08-27 ENCOUNTER — Other Ambulatory Visit: Payer: Self-pay

## 2021-08-27 ENCOUNTER — Encounter: Payer: Self-pay | Admitting: Neurology

## 2021-08-27 ENCOUNTER — Emergency Department (HOSPITAL_COMMUNITY)
Admission: EM | Admit: 2021-08-27 | Discharge: 2021-08-27 | Disposition: A | Discharge status: Home / Self Care | Payer: Medicare Other | Attending: Emergency Medicine | Admitting: Emergency Medicine

## 2021-08-27 ENCOUNTER — Telehealth: Payer: Self-pay | Admitting: Family Medicine

## 2021-08-27 ENCOUNTER — Encounter: Payer: Self-pay | Admitting: Family Medicine

## 2021-08-27 ENCOUNTER — Encounter (HOSPITAL_COMMUNITY): Payer: Self-pay | Admitting: Emergency Medicine

## 2021-08-27 ENCOUNTER — Emergency Department (HOSPITAL_COMMUNITY): Payer: Medicare Other

## 2021-08-27 DIAGNOSIS — J449 Chronic obstructive pulmonary disease, unspecified: Secondary | ICD-10-CM | POA: Insufficient documentation

## 2021-08-27 DIAGNOSIS — R0789 Other chest pain: Secondary | ICD-10-CM | POA: Diagnosis not present

## 2021-08-27 DIAGNOSIS — Z7982 Long term (current) use of aspirin: Secondary | ICD-10-CM | POA: Diagnosis not present

## 2021-08-27 DIAGNOSIS — Z7951 Long term (current) use of inhaled steroids: Secondary | ICD-10-CM | POA: Insufficient documentation

## 2021-08-27 DIAGNOSIS — E119 Type 2 diabetes mellitus without complications: Secondary | ICD-10-CM | POA: Diagnosis not present

## 2021-08-27 DIAGNOSIS — R079 Chest pain, unspecified: Secondary | ICD-10-CM

## 2021-08-27 DIAGNOSIS — E041 Nontoxic single thyroid nodule: Secondary | ICD-10-CM

## 2021-08-27 DIAGNOSIS — Z87891 Personal history of nicotine dependence: Secondary | ICD-10-CM | POA: Insufficient documentation

## 2021-08-27 DIAGNOSIS — I2510 Atherosclerotic heart disease of native coronary artery without angina pectoris: Secondary | ICD-10-CM | POA: Insufficient documentation

## 2021-08-27 DIAGNOSIS — I10 Essential (primary) hypertension: Secondary | ICD-10-CM | POA: Diagnosis not present

## 2021-08-27 DIAGNOSIS — Z79899 Other long term (current) drug therapy: Secondary | ICD-10-CM | POA: Diagnosis not present

## 2021-08-27 LAB — TROPONIN I (HIGH SENSITIVITY)
Troponin I (High Sensitivity): 5 ng/L (ref <18)
Troponin I (High Sensitivity): 6 ng/L (ref <18)

## 2021-08-27 LAB — CBC
HCT: 43.1 % (ref 36.0–46.0)
Hemoglobin: 14.7 g/dL (ref 12.0–15.0)
MCH: 31.9 pg (ref 26.0–34.0)
MCHC: 34.1 g/dL (ref 30.0–36.0)
MCV: 93.5 fL (ref 80.0–100.0)
Platelets: 247 10*3/uL (ref 150–400)
RBC: 4.61 MIL/uL (ref 3.87–5.11)
RDW: 12.9 % (ref 11.5–15.5)
WBC: 9.9 10*3/uL (ref 4.0–10.5)
nRBC: 0 % (ref 0.0–0.2)

## 2021-08-27 LAB — BASIC METABOLIC PANEL
Anion gap: 10 (ref 5–15)
BUN: 9 mg/dL (ref 8–23)
CO2: 24 mmol/L (ref 22–32)
Calcium: 9.5 mg/dL (ref 8.9–10.3)
Chloride: 102 mmol/L (ref 98–111)
Creatinine, Ser: 0.75 mg/dL (ref 0.44–1.00)
GFR, Estimated: >60 mL/min (ref >60)
Glucose, Bld: 116 mg/dL — ABNORMAL HIGH (ref 70–99)
Potassium: 3.4 mmol/L — ABNORMAL LOW (ref 3.5–5.1)
Sodium: 136 mmol/L (ref 135–145)

## 2021-08-27 MED ORDER — METOPROLOL TARTRATE 25 MG PO TABS: 37.5000 mg | ORAL_TABLET | Freq: Two times a day (BID) | ORAL | 3 refills | Status: DC

## 2021-08-27 MED ORDER — ISOSORBIDE MONONITRATE ER 30 MG PO TB24: ORAL_TABLET | ORAL | 0 refills | Status: DC

## 2021-08-27 MED ORDER — NITROGLYCERIN 0.4 MG SL SUBL
0.4000 mg | SUBLINGUAL_TABLET | SUBLINGUAL | Status: DC | PRN
  Administered 2021-08-27: 0.4 mg via SUBLINGUAL
  Filled 2021-08-27: qty 1

## 2021-08-27 NOTE — ED Triage Notes (Signed)
Patient complain of an ache in her chest that started two days ago. Patient states her imdur dose was increased to 60mg  per day today but no relief in pain. Patient also reports mild shortness of breath. Patient alert, oriented.

## 2021-08-27 NOTE — Discharge Instructions (Signed)
Keep blood pressure log at home over the next few days.  Recommend that you take an increased dose of Imdur every morning (90 mg or 3 tablets every morning).  Additionally recommend increasing your metoprolol dose to 37.5 mg twice daily which is 1.5 tablets twice daily.  Follow-up with your cardiologist in a couple of days for recheck.

## 2021-08-27 NOTE — Telephone Encounter (Signed)
Received a call back from patient Dr.Jordan's advice given.She will increase Imdur to 60 mg daily.Appointment scheduled with Dr.Jordan 9/26 at 10:20 am.Advised if she has any more chest pain go to ED.

## 2021-08-27 NOTE — Telephone Encounter (Signed)
Spoke to patient she stated she is having a dull ache in chest.Stated she would like to see a PA this afternoon and be admitted to hospital.Advised no appointments available this afternoon.Advised we do not see active chest pain in office.Advised she needs to go to ED.Patient stated she hates to wait in ED.Patient encouraged to go to ED now.

## 2021-08-27 NOTE — Telephone Encounter (Signed)
Called patient; left message to call back.

## 2021-08-27 NOTE — Telephone Encounter (Signed)
Pt has not been seen since Dec 2020. Unable to authorize refill. Pt should request refill through PCP until can be further evaluated in the office.  Although upcoming apt with Dr Leta Baptist is scheduled in Nov it is scheduled as a new patient visit (appears to be new concern) We could discuss continuing med at that visit going forward. I will send the patient a mychart message informing this information.   **If pt calls back please advise this.

## 2021-08-27 NOTE — ED Provider Notes (Signed)
[facility] EMERGENCY DEPARTMENT Provider Note   CSN: [id number] Arrival date & time: 08/27/21  1614     History Chief Complaint  Patient presents with   Chest Pain    Megan [name] is a 75 y.o. female.   Chest Pain Associated symptoms: no abdominal pain, no back pain, no cough, no fever, no palpitations, no shortness of breath and no vomiting      Megan [name] is a 75 y.o. female with medical history significant of  renal a stenosis s/p stent 2008, abdominal aortic aneursym s/p stent 2009, PAD s/p left iliac stent HTN,HLD. Patient also has most significant history of CAD s/p MI s/p CABG 1995,NSTEMI 2/26-02/03/21 with peak Ce fo 1085 with ekg showing significant inferolateral TWA. Patient underwent cardiac cath which noted patent LIMA to the LAD. The native RCA was occluded and she had severe sequential lesions in the SVG to RCA which was treated with overlapping DES x 2. Patient was discharge on DAPT with ASA and Brilinta and continued on metoprolol and high dose statin, recent admission for chest pain with subsequent heart cath on 06/26/2021 which revealed moderate diffuse LAD disease, left main 15%, left circumflex patent, RCA proximal 75% stenosis, LIMA to LAD patent though small, status post PCI to SVG-RCA due to 80% stenosed stent.    She presents to the emergency department today with a similar presentation that prompted her presentation in July requiring stent placement.  She states that for the past few days she has had dull achy chest pain in the left side of her chest.  She states that the pain is not made worse with exertion and has no radiation with no nausea, vomiting, diaphoresis, shortness of breath, palpitations.  No recent syncope or near syncope.  Past Medical History:  Diagnosis Date   AAA (abdominal aortic aneurysm) (Megan [name])    a. s/p stent grafting in 2009.   Adenomatous colon polyp 05/2001   Adenomatous duodenal polyp    Allergy     Anginal pain (HCC)    COPD (chronic obstructive pulmonary disease) ([place])    pt denies COPD   Coronary artery disease    a. s/p CABG in 1995;  b. 05/2013 Neg MV, EF 82%;  c. 06/2013 Cath: LM nl, LAD 40-50p, LCX nl, RCA 80p/144m, VG->RCA->PDA->PLSA min irregs, LIMA->LAD atretic, vigorous LV fxn.   Diabetes mellitus without complication (HCC)    history of, resolved. 2017   Diverticulosis    Eczema, dyshidrotic    Eczema, dyshidrotic 1986   Dr. Mathis Fare   Emphysema of lung [facility])    Fatty liver    Fibromyalgia    GERD (gastroesophageal reflux disease)    Hyperlipidemia    Hypertension    Migraine    Myocardial infarction [facility])    Osteoarthritis    Osteoporosis    unsure, having a bone scan soon   Peripheral arterial disease ([place])    left leg diagnosed by Dr Mare Ferrari   Renal artery stenosis [facility])    a. 2008 s/p PTA    Patient Active Problem List   Diagnosis Date Noted   Thyroid nodule greater than or equal to 1.5 cm in diameter incidentally noted on imaging study 08/27/2021   Chest pain 06/25/2021   History of compression fracture of spine - lumbar 06/19/2021   History of lumbosacral spine surgery 03/03/2021   NSTEMI (non-ST elevated myocardial infarction) [facility]) 2022 01/31/2021   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/26/2021   Diarrhea 01/06/2021  Neuropathic pain 12/05/2020   Gait instability 09/09/2020   Lumbar stenosis without neurogenic claudication 02/26/2020   COPD with chronic bronchitis ([place]) 09/28/2018   Essential (primary) hypertension 07/10/2018   IFG (impaired fasting glucose) 11/10/2017   DJD (degenerative joint disease), lumbosacral 11/10/2017   Fibromyalgia 11/10/2017   Dyshidrotic eczema 11/10/2017   GERD (gastroesophageal reflux disease) 11/10/2017   Osteopenia 11/10/2017   Does use hearing aid 11/10/2017   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   Fatty liver 11/10/2017   Coronary artery disease    Unstable angina ([place]) 06/09/2013   Peripheral  vascular disease with claudication ([place])    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   History of abdominal aortic aneurysm repair 05/18/2011   History of renal artery stenosis 05/18/2011   Hyperlipidemia LDL goal <70     Past Surgical History:  Procedure Laterality Date   ABDOMINAL AORTAGRAM N/A 03/06/2014   Procedure: ABDOMINAL AORTAGRAM;  Surgeon: Rosetta Posner, MD;  Location: [facility] CATH LAB;  Service: Cardiovascular;  Laterality: N/A;   ABDOMINAL AORTIC ANEURYSM REPAIR  2009   stenting   ABDOMINAL HYSTERECTOMY     Total, 1992   BREAST BIOPSY     CARDIAC CATHETERIZATION     CARDIOVASCULAR STRESS TEST  11/11/2009   normal study   CARPAL TUNNEL RELEASE  08/1993   left wrist   CARPAL TUNNEL RELEASE  01/1997   right   CATARACT EXTRACTION, BILATERAL  2021   CORONARY ARTERY BYPASS GRAFT  07/1994   Triple by Dr Ceasar Mons   CORONARY STENT INTERVENTION N/A 02/02/2021   Procedure: CORONARY STENT INTERVENTION;  Surgeon: Nelva Bush, MD;  Location: [place] CV LAB;  Service: Cardiovascular;  Laterality: N/A;   CORONARY STENT INTERVENTION N/A 06/26/2021   Procedure: CORONARY STENT INTERVENTION;  Surgeon: Sherren Mocha, MD;  Location: [place] CV LAB;  Service: Cardiovascular;  Laterality: N/A;   dental implants     EYE SURGERY  03/2020   cataract   HAND SURGERY Right 09/2002   Right hand pulley release   INTRAVASCULAR ULTRASOUND/IVUS N/A 06/26/2021   Procedure: Intravascular Ultrasound/IVUS;  Surgeon: Sherren Mocha, MD;  Location: [place] CV LAB;  Service: Cardiovascular;  Laterality: N/A;   LEFT HEART CATH AND CORONARY ANGIOGRAPHY N/A 06/26/2021   Procedure: LEFT HEART CATH AND CORONARY ANGIOGRAPHY;  Surgeon: Sherren Mocha, MD;  Location: [place] CV LAB;  Service: Cardiovascular;  Laterality: N/A;   LEFT HEART CATH AND CORS/GRAFTS ANGIOGRAPHY N/A 02/02/2021   Procedure: LEFT HEART CATH AND CORS/GRAFTS ANGIOGRAPHY;  Surgeon: Nelva Bush, MD;  Location: [place] CV LAB;   Service: Cardiovascular;  Laterality: N/A;   LEFT HEART CATHETERIZATION WITH CORONARY/GRAFT ANGIOGRAM N/A 06/11/2013   Procedure: LEFT HEART CATHETERIZATION WITH [name];  Surgeon: Thayer Headings, MD;  Location[facility];  Service: Cardiovascular;  Laterality: N/A;   MULTIPLE TOOTH EXTRACTIONS  2000   All upper teeth.  Has full dneture.    RENAL ARTERY STENT  2008   SHOULDER ARTHROSCOPY WITH ROTATOR CUFF REPAIR Right 09/27/2018   Procedure: Right shoulder mini open rotator cuff repair;  Surgeon: Susa Day, MD;  Location: [facility] ORS;  Service: Orthopedics;  Laterality: Right;  90 mins   thyroid cyst apiration  05/1997 and 07/1998   TONSILECTOMY, ADENOIDECTOMY, BILATERAL MYRINGOTOMY AND TUBES  1989   TONSILLECTOMY     TOTAL ABDOMINAL HYSTERECTOMY  1992     OB History   No obstetric history on file.     Family History  Problem Relation Age of Onset  Heart disease Mother    Heart attack Mother    Heart disease Father        before age 51   Heart attack Father    Heart disease Sister        before age 83   Diabetes Sister    Hyperlipidemia Sister    Hypertension Sister    Heart attack Sister    Liver cancer Sister    Breast cancer Sister    Heart disease Brother    Diabetes Brother    Hyperlipidemia Brother    Hypertension Brother    Heart attack Brother    AAA (abdominal aortic aneurysm) Brother    Colon cancer Paternal Grandmother    Heart disease Brother    Colon cancer Cousin        first cousin on father's side   Pancreatic cancer Neg Hx    Rectal cancer Neg Hx    Stomach cancer Neg Hx    Esophageal cancer Neg Hx     Social History   Tobacco Use   Smoking status: Former    Packs/day: 0.00    Years: 37.00    Pack years: 0.00    Types: Cigarettes    Quit date: 12/07/1995    Years since quitting: 25.7   Smokeless tobacco: Never  Vaping Use   Vaping Use: Never used  Substance Use Topics   Alcohol use: Yes    Alcohol/week: 6.0 - 10.0  standard drinks    Types: 6 - 10 Glasses of wine per week    Comment: 2-3 glasses daily   Drug use: No    Home Medications Prior to Admission medications   Medication Sig Start Date End Date Taking? Authorizing Provider  Accu-Chek FastClix Lancets MISC Check blood sugar once daily PRN 07/30/19   [name], MD  albuterol (PROVENTIL HFA;VENTOLIN HFA) 108 (90 Base) MCG/ACT inhaler Inhale 2 puffs into the lungs every 4 (four) hours as needed for wheezing or shortness of breath. 11/13/18   [name], MD  ALPRAZolam Duanne Moron) 0.25 MG tablet Take 1 tablet (0.25 mg total) by mouth at bedtime as needed for sleep. 09/15/20   [place]ique, Peter M, MD  aspirin EC 81 MG tablet Take 1 tablet (81 mg total) by mouth daily. 05/09/20   [place], Peter M, MD  atorvastatin (LIPITOR) 80 MG tablet TAKE 1 TABLET BY MOUTH EVERY DAY 08/11/21   [place], Peter M, MD  B Complex-Biotin-FA (B-100 COMPLEX PO) Take 1 tablet by mouth at bedtime.     [provider]  Budeson-Glycopyrrol-Formoterol (BREZTRI AEROSPHERE) 160-9-4.8 MCG/ACT AERO Inhale 2 puffs into the lungs in the morning and at bedtime. 02/27/20   [name], MD  CORDRAN 4 MCG/SQCM TAPE Apply 1 each topically daily as needed (Rash).  05/17/19   [provider]  cycloSPORINE (RESTASIS) 0.05 % ophthalmic emulsion Place 1 drop into both eyes 2 (two) times daily.     [provider]  diclofenac (VOLTAREN) 75 MG EC tablet Take 1 tablet (75 mg total) by mouth 2 (two) times daily as needed. 06/27/21   Kayleen Memos, DO  gabapentin (NEURONTIN) 300 MG capsule Take 1-2 capsules (300-600 mg total) by mouth 3 (three) times daily. 11/15/19   Lomax, Amy, NP  glucose blood (ACCU-CHEK SMARTVIEW) test strip CHECK BLOOD SUGAR ONCE DAILY AS NEEDED 01/17/20   [name], MD  guaiFENesin-codeine 100-10 MG/5ML syrup Take 5 mLs by mouth every 6 (six) hours as needed for cough.  06/19/21   [name], MD  hydrochlorothiazide (HYDRODIURIL) 12.5 MG tablet  Take 1 tablet (12.5 mg total) by mouth daily. 08/06/21 11/04/21  Deberah Pelton, NP  hydrOXYzine (ATARAX/VISTARIL) 25 MG tablet Take 1 tablet (25 mg total) by mouth 3 (three) times daily as needed. 03/09/21   [place], Peter M, MD  isosorbide mononitrate (IMDUR) 30 MG 24 hr tablet Take 3 tablets ( 90 mg ) every morning 08/27/21   Regan Lemming, MD  lidocaine (LIDODERM) 5 % Place 1 patch onto the skin every 12 (twelve) hours. Remove & Discard patch within 12 hours or as directed by MD 06/27/21 06/27/22  Kayleen Memos, DO  losartan (COZAAR) 50 MG tablet Take 1 tablet (50 mg total) by mouth daily. 08/07/21 09/06/21  [place], Peter M, MD  metoprolol tartrate (LOPRESSOR) 25 MG tablet Take 1.5 tablets (37.5 mg total) by mouth 2 (two) times daily. 08/27/21   Regan Lemming, MD  Multiple Vitamins-Minerals (MULTIVITAMIN WITH MINERALS) tablet Take 1 tablet by mouth at bedtime.    [provider]  nitroGLYCERIN (NITROSTAT) 0.4 MG SL tablet Place 1 tablet (0.4 mg total) under the tongue every 5 (five) minutes as needed for chest pain. 01/09/20   [place], Peter M, MD  omeprazole (PRILOSEC) 40 MG capsule Take 1 capsule (40 mg total) by mouth in the morning and at bedtime. 05/11/21   Ladene Artist, MD  ondansetron (ZOFRAN) 8 MG tablet Take by mouth every 8 (eight) hours as needed for nausea or vomiting.    [provider]  oxyCODONE-acetaminophen (PERCOCET) 10-325 MG tablet Take 1 tablet by mouth in the morning and at bedtime. 04/08/21   [provider]  prasugrel (EFFIENT) 10 MG TABS tablet Take 1/2 tablet ( 5 Mg ) daily 03/09/21   [place], Peter M, MD  tretinoin (RETIN-A) 0.1 % cream Apply 1 application topically every other day. In the evening    [provider]  VITAMIN D, ERGOCALCIFEROL, PO Take 1,000 Units by mouth every evening.     [provider]    Allergies    Anticoagulant compound, Bextra [valdecoxib], Hydrocod polst-cpm polst er, Lipitor [atorvastatin], Mevacor  [lovastatin], Plavix [clopidogrel bisulfate], Zocor [simvastatin], Doxycycline, Metoprolol, Morphine and related, Codeine, and Lisinopril  Review of Systems   Review of Systems  Constitutional:  Negative for chills and fever.  HENT:  Negative for ear pain and sore throat.   Eyes:  Negative for pain and visual disturbance.  Respiratory:  Negative for cough and shortness of breath.   Cardiovascular:  Positive for chest pain. Negative for palpitations.  Gastrointestinal:  Negative for abdominal pain and vomiting.  Genitourinary:  Negative for dysuria and hematuria.  Musculoskeletal:  Negative for arthralgias and back pain.  Skin:  Negative for color change and rash.  Neurological:  Negative for seizures and syncope.  All other systems reviewed and are negative.  Physical Exam Updated Vital Signs BP (!) 168/77   Pulse 76   Temp 99.2 F (37.3 C)   Resp 13   SpO2 97%   Physical Exam Vitals and nursing note reviewed.  Constitutional:      General: She is not in acute distress. HENT:     Head: Normocephalic and atraumatic.  Eyes:     Conjunctiva/sclera: Conjunctivae normal.     Pupils: Pupils are equal, round, and reactive to light.  Cardiovascular:     Rate and Rhythm: Normal rate and regular rhythm.     Heart sounds: Murmur heard.  Systolic  murmur is present.  Pulmonary:     Effort: Pulmonary effort is normal. No respiratory distress.     Breath sounds: Normal breath sounds.  Abdominal:     General: There is no distension.     Tenderness: There is no guarding.  Musculoskeletal:        General: No deformity or signs of injury.     Cervical back: Normal range of motion and neck supple.     Right lower leg: No edema.     Left lower leg: No edema.  Skin:    Findings: No lesion or rash.  Neurological:     General: No focal deficit present.     Mental Status: She is alert. Mental status is at baseline.    ED Results / Procedures / Treatments   Labs (all labs ordered are  listed, but only abnormal results are displayed) Labs Reviewed  BASIC METABOLIC PANEL - Abnormal; Notable for the following components:      Result Value   Potassium 3.4 (*)    Glucose, Bld 116 (*)    All other components within normal limits  RESP PANEL BY RT-PCR (FLU A&B, COVID) ARPGX2  CBC  TROPONIN I (HIGH SENSITIVITY)  TROPONIN I (HIGH SENSITIVITY)    EKG EKG Interpretation  Date/Time:  Thursday August 27 2021 17:03:26 EDT Ventricular Rate:  81 PR Interval:  166 QRS Duration: 86 QT Interval:  380 QTC Calculation: 441 R Axis:   29 Text Interpretation: Normal sinus rhythm Nonspecific ST and T wave abnormality Abnormal ECG No significant change since last tracing Confirmed by Regan Lemming ([id number]) on 08/27/2021 9:17:00 PM  Radiology DG Chest 2 View  Result Date: 08/27/2021 CLINICAL DATA:  Chest pain. EXAM: CHEST - 2 VIEW COMPARISON:  Chest x-ray 01/31/2021. FINDINGS: Patient is status post cardiac surgery. The cardiomediastinal silhouette is within normal limits. There are mitral annular calcifications. There are atherosclerotic calcifications of the aorta. There is no focal lung consolidation, pleural effusion or pneumothorax. No acute fractures are seen. Vascular stent is identified in the abdomen. Lumbar fusion hardware is partially visualized. IMPRESSION: No active cardiopulmonary disease. Electronically Signed   By: Ronney Asters M.D.   On: 08/27/2021 18:57   US THYROID  Result Date: 08/26/2021 CLINICAL DATA:  2.1 cm right thyroid nodule by chest CTA EXAM: THYROID ULTRASOUND TECHNIQUE: Ultrasound examination of the thyroid gland and adjacent soft tissues was performed. COMPARISON:  06/25/2021 FINDINGS: Parenchymal Echotexture: Markedly heterogenous Isthmus: 4 mm Right lobe: 4.6 x 2.8 x 2.8 cm Left lobe: 4.4 x 1.6 x 1.5 cm _________________________________________________________ Estimated total number of nodules >/= 1 cm: 5 Number of spongiform nodules >/=  2 cm not described  below (TR1): 0 Number of mixed cystic and solid nodules >/= 1.5 cm not described below ([place]): 0 _________________________________________________________ Nodule # 1: Location: Isthmus; right Maximum size: 1.3 cm; Other 2 dimensions: 1.2 x 0.7 cm Composition: solid/almost completely solid (2) Echogenicity: isoechoic (1) Shape: not taller-than-wide (0) Margins: ill-defined (0) Echogenic foci: peripheral calcifications (2) ACR TI-RADS total points: 5. ACR TI-RADS risk category: TR4 (4-6 points). ACR TI-RADS recommendations: *Given size (>/= 1 - 1.4 cm) and appearance, a follow-up ultrasound in 1 year should be considered based on TI-RADS criteria. _________________________________________________________ Nodule # 3: Location: Left; Inferior Maximum size: 2.5 cm; Other 2 dimensions: 2.5 x 2.1 cm Composition: solid/almost completely solid (2) Echogenicity: isoechoic (1) Shape: not taller-than-wide (0) Margins: ill-defined (0) Echogenic foci: punctate echogenic foci (3) ACR TI-RADS total points: 6. ACR TI-RADS  risk category: TR4 (4-6 points). ACR TI-RADS recommendations: **Given size (>/= 1.5 cm) and appearance, fine needle aspiration of this moderately suspicious nodule should be considered based on TI-RADS criteria. _________________________________________________________ There are 3 additional left thyroid cystic and isoechoic nodules noted, all measuring 1.3 cm or less. These would not meet criteria for any follow-up or biopsy are not fully described by TI rads criteria. Nonspecific thyroid heterogeneity. No hypervascularity. No regional adenopathy. IMPRESSION: 2.5 cm left inferior thyroid TR 4 nodule meets criteria for biopsy as above. This correlates with the CTA finding. Additional nodules noted as above. The above is in keeping with the ACR TI-RADS recommendations - J Am Coll Radiol 2017;14:587-595. Electronically Signed   By: Jerilynn Mages.  Shick M.D.   On: 08/26/2021 16:23    Procedures Procedures   Medications  Ordered in ED Medications  nitroGLYCERIN (NITROSTAT) SL tablet 0.4 mg (has no administration in time range)    ED Course  I have reviewed the triage vital signs and the nursing notes.  Pertinent labs & imaging results that were available during my care of the patient were reviewed by me and considered in my medical decision making (see chart for details).    MDM Rules/Calculators/A&P                             Gerilynn Elenore Wanninger is a 75 y.o. female with medical history significant of  renal a stenosis s/p stent 2008, abdominal aortic aneursym s/p stent 2009, PAD s/p left iliac stent HTN,HLD. Patient also has most significant history of CAD s/p MI s/p CABG 1995,NSTEMI 2/26-02/03/21, recent hospital admission in July 2022 with resultant heart catheterization that showed 80% stenosis  SVG-RCA requiring stent placement.  She presents with similar dull chest pain on the left that has been present for the past 2 days.  She states her symptoms are similar to her prior presentation requiring admission.  On chart review, the patient had had negative troponins x2 and no ischemic changes on EKG but on admission underwent cardiac catheterization due to concern for ischemia.  Chest pain work-up initiated in the ED.  The patient states that she has had no relief of her symptoms despite home nitroglycerin.  She states she took 2 full-strength 325 mg of aspirin earlier today.  Concern for anginal symptoms in this patient with an extensive cardiac history.  No infectious symptoms to suggest pneumonia or URI.  EKG today reveals no clear ischemic changes.  Troponins x2 were negative.  Chest x-ray without acute cardiac or pulmonary abnormality.  BMP with mild hypokalemia to 3.4, replenished orally, COVID-19 and influenza PCR collected.  Given the patient's recent presentation, cardiology consulted for further recommendations.  I spoke with Dr. Jonne Ply of cardiology who recommended increasing the patient's  Imdur dose to 90 mg daily and increasing her metoprolol dose to 37.5 mg twice daily, keeping a blood pressure log at home and following up with her regular scheduled cardiology appointment in the next couple of days.  The patient's troponins have been negative and she has no clear ischemic changes on EKG.  I discussed the plan with the patient who felt comfortable with the plan for discharge and outpatient follow-up.  Final Clinical Impression(s) / ED Diagnoses Final diagnoses:  Chest pain, unspecified type    Rx / DC Orders ED Discharge Orders          Ordered    isosorbide mononitrate (IMDUR) 30 MG 24 hr  tablet        08/27/21 2132    metoprolol tartrate (LOPRESSOR) 25 MG tablet  2 times daily        08/27/21 2132             Regan Lemming, MD 08/27/21 2135

## 2021-08-27 NOTE — ED Provider Notes (Signed)
Emergency Medicine Provider Triage Evaluation Note  Naziyah Tieszen , a 75 y.o. female  was evaluated in triage.  Pt complains of chest pain.  Patient states that it feels like an ache in her chest, began 2 days ago.  She is taking ASA 325 x2 and her normal prescribed Imdur with no relief of symptoms.  Patient states that in July she had similar symptoms, found that her prior stent had collapsed and had to be revised via cardiac catheterization.  Pain radiates into her left jaw.  Review of Systems  Positive: Chest pain, chills Negative: Nausea, vomiting, diarrhea  Physical Exam  BP (!) 155/65 (BP Location: Left Arm)   Pulse 86   Temp 99.2 F (37.3 C)   Resp 16   SpO2 96%  Gen:   Awake, no distress  Resp:  Normal effort MSK:   Moves extremities without difficulty    Medical Decision Making  Medically screening exam initiated at 5:04 PM.  Appropriate orders placed.  Hend Earna Coder was informed that the remainder of the evaluation will be completed by another provider, this initial triage assessment does not replace that evaluation, and the importance of remaining in the ED until their evaluation is complete.     Nestor Lewandowsky 08/27/21 [place], [place], DO 08/27/21 1758

## 2021-08-27 NOTE — Telephone Encounter (Signed)
Pt is needing a refill request for her gabapentin (NEURONTIN) 300 MG capsule sent in to the [facility] on [address]

## 2021-08-28 NOTE — H&P (View-Only) (Signed)
Cardiology Office Note   Date:  08/31/2021   ID:  Betzabe, Bevans 1946/05/21, MRN [id number]  PCP:  [name], MD  Cardiologist: Severo Beber Martinique MD  Chief Complaint  Patient presents with   Coronary Artery Disease       History of Present Illness: Megan [name] is a 75 y.o. female who is seen for follow up CAD.  She has a history  of CAD, HTN, HL, and PAD.   She is status post coronary artery bypass graft surgery in 1995- Dr Servando Snare. She had a renal artery stent for renal artery stenosis in 2008. She had a stent graft of an abdominal aortic aneurysm 2009. She is s/p stenting of the left iliac. She is followed by [name] for PAD. Last CT in May 2018. She had [place] in June 2019 showing no endoleak.    She was hospitalized in July 2014 and had cardiac catheterization which showed that her grafts were open. EF normal.  She was hospitalized 11/15/2014 with which he describes as an aching sensation in her chest.    She underwent a treadmill Myoview on 11/17/14 which showed no ischemia and her ejection fraction was 86%.   In June 2019 she had Abdominal US at [facility] showing AAA of 4 cm.   In October 2019 she underwent right rotator cuff repair. No complications.   She was  having progressive issues with her low back that limited her activity and caused pain. She has exhausted conservative measures and underwent lumbar fusion in July by Dr Ronnald Ramp.   She was in the hospital  1/31 and discharged on 2/8- for AKI, diarrhea- diverticulitis (sigmoid colon), metabolic encephalopathy, UTI. Echo at that time was OK with normal EF and moderate TR.   She was re- admitted 2/26-02/03/21 with NSTEMI. Troponin up to 1085. Ecg showed significant inferolateral TWA. She underwent cardiac cath demonstrating patent LIMA to the LAD. The native RCA was occluded and she had severe sequential lesions in the SVG to RCA. This was treated with overlapping DES x 2- 3.0 x 26 and 3.5 x 34 mm Onyx stents.   Procedure was complicated by a left forearm hematoma. She was treated with DAPT with ASA and Brilinta. On metoprolol and high dose statin.   She was admitted for overnight observation on 02/11/21 after presenting with a localized ache in her left chest. This was different than when she presented with her NSTEMI. Ecg showed no new changes and troponin was negative x 3  On her last visit she complained of increased SOB- this was felt to be related to [place]. We switched to Plavix but she had an allergic reaction with rash and hives. Later switched to Prasugrel- reduced dose based on age and female.    She presented to the ED on 7/21 with complaints of chest pain over the past 2 days. Developed an aching sensation in the left side of her chest on Tuesday afternoon. Tried a SL nitro without relief. Also tried several reflux medications without much either. Symptoms lingered into the night and next morning she woke up with ongoing symptoms. Called the office and was instructed to present to the ED. States symptoms are exactly like what she experienced in the past with her previous cardia events.   She underwent repeat cardiac cath demonstrating restenosis of the SVG to RCA with clear evidence of prior stent fracture. She underwent repeat PCI with 3.5 x 26 mm Onyx stent post dilated to 4.0  with Lisco balloon using IVUS guidance. Of note there were no collaterals to the distal RCA. There was significant aortic calcification. On July 28 she complained of some recurrent chest pain and her Imdur was increased. When seen in office on August 17 she was better. A sleep study ordered due to daytime somnolence.   She was seen in the ED on 08/27/21 with recurrent chest pain. Ecg showed no changes and troponins were negative. Reports pain the same as before. In discussion with cardiology fellow she was DC from ED with increased dose of Imdur to 90 mg daily and metoprolol to 37.5 mg bid. Seen today for follow up.   She  reports that initially post stent her symptoms improved. She has had infrequent angina until one week ago when symptoms clearly changed. Increase in medication has helped some.     Past Medical History:  Diagnosis Date   AAA (abdominal aortic aneurysm) ([place])    a. s/p stent grafting in 2009.   Adenomatous colon polyp 05/2001   Adenomatous duodenal polyp    Allergy    Anginal pain (HCC)    COPD (chronic obstructive pulmonary disease) ([place])    pt denies COPD   Coronary artery disease    a. s/p CABG in 1995;  b. 05/2013 Neg MV, EF 82%;  c. 06/2013 Cath: LM nl, LAD 40-50p, LCX nl, RCA 80p/116m, VG->RCA->PDA->PLSA min irregs, LIMA->LAD atretic, vigorous LV fxn.   Diabetes mellitus without complication (HCC)    history of, resolved. 2017   Diverticulosis    Eczema, dyshidrotic    Eczema, dyshidrotic 1986   Dr. Mathis Fare   Emphysema of lung [facility])    Fatty liver    Fibromyalgia    GERD (gastroesophageal reflux disease)    Hyperlipidemia    Hypertension    Migraine    Myocardial infarction [facility])    Osteoarthritis    Osteoporosis    unsure, having a bone scan soon   Peripheral arterial disease ([place])    left leg diagnosed by Dr Mare Ferrari   Renal artery stenosis [facility])    a. 2008 s/p PTA    Past Surgical History:  Procedure Laterality Date   ABDOMINAL AORTAGRAM N/A 03/06/2014   Procedure: ABDOMINAL AORTAGRAM;  Surgeon: Rosetta Posner, MD;  Location: [facility] CATH LAB;  Service: Cardiovascular;  Laterality: N/A;   ABDOMINAL AORTIC ANEURYSM REPAIR  2009   stenting   ABDOMINAL HYSTERECTOMY     Total, 1992   BREAST BIOPSY     CARDIAC CATHETERIZATION     CARDIOVASCULAR STRESS TEST  11/11/2009   normal study   CARPAL TUNNEL RELEASE  08/1993   left wrist   CARPAL TUNNEL RELEASE  01/1997   right   CATARACT EXTRACTION, BILATERAL  2021   CORONARY ARTERY BYPASS GRAFT  07/1994   Triple by Dr Ceasar Mons   CORONARY STENT INTERVENTION N/A 02/02/2021   Procedure: CORONARY STENT INTERVENTION;  Surgeon:  Nelva Bush, MD;  Location: [place] CV LAB;  Service: Cardiovascular;  Laterality: N/A;   CORONARY STENT INTERVENTION N/A 06/26/2021   Procedure: CORONARY STENT INTERVENTION;  Surgeon: Sherren Mocha, MD;  Location: [place] CV LAB;  Service: Cardiovascular;  Laterality: N/A;   dental implants     EYE SURGERY  03/2020   cataract   HAND SURGERY Right 09/2002   Right hand pulley release   INTRAVASCULAR ULTRASOUND/IVUS N/A 06/26/2021   Procedure: Intravascular Ultrasound/IVUS;  Surgeon: Sherren Mocha, MD;  Location: [place] CV LAB;  Service:  Cardiovascular;  Laterality: N/A;   LEFT HEART CATH AND CORONARY ANGIOGRAPHY N/A 06/26/2021   Procedure: LEFT HEART CATH AND CORONARY ANGIOGRAPHY;  Surgeon: Sherren Mocha, MD;  Location: [place] CV LAB;  Service: Cardiovascular;  Laterality: N/A;   LEFT HEART CATH AND CORS/GRAFTS ANGIOGRAPHY N/A 02/02/2021   Procedure: LEFT HEART CATH AND CORS/GRAFTS ANGIOGRAPHY;  Surgeon: Nelva Bush, MD;  Location: [place] CV LAB;  Service: Cardiovascular;  Laterality: N/A;   LEFT HEART CATHETERIZATION WITH CORONARY/GRAFT ANGIOGRAM N/A 06/11/2013   Procedure: LEFT HEART CATHETERIZATION WITH [name];  Surgeon: Thayer Headings, MD;  Location: [facility] CATH LAB;  Service: Cardiovascular;  Laterality: N/A;   MULTIPLE TOOTH EXTRACTIONS  2000   All upper teeth.  Has full dneture.    RENAL ARTERY STENT  2008   SHOULDER ARTHROSCOPY WITH ROTATOR CUFF REPAIR Right 09/27/2018   Procedure: Right shoulder mini open rotator cuff repair;  Surgeon: Susa Day, MD;  Location: [facility] ORS;  Service: Orthopedics;  Laterality: Right;  90 mins   thyroid cyst apiration  05/1997 and 07/1998   TONSILECTOMY, ADENOIDECTOMY, BILATERAL MYRINGOTOMY AND TUBES  1989   TONSILLECTOMY     TOTAL ABDOMINAL HYSTERECTOMY  1992     Current Outpatient Medications  Medication Sig Dispense Refill   Accu-Chek FastClix Lancets MISC Check blood sugar once daily PRN 100 each 1    albuterol (PROVENTIL HFA;VENTOLIN HFA) 108 (90 Base) MCG/ACT inhaler Inhale 2 puffs into the lungs every 4 (four) hours as needed for wheezing or shortness of breath. 1 Inhaler 2   ALPRAZolam (XANAX) 0.25 MG tablet Take 1 tablet (0.25 mg total) by mouth at bedtime as needed for sleep. 90 tablet 3   aspirin EC 81 MG tablet Take 1 tablet (81 mg total) by mouth daily. 90 tablet 3   atorvastatin (LIPITOR) 80 MG tablet TAKE 1 TABLET BY MOUTH EVERY DAY 90 tablet 3   B Complex-Biotin-FA (B-100 COMPLEX PO) Take 1 tablet by mouth at bedtime.      Budeson-Glycopyrrol-Formoterol (BREZTRI AEROSPHERE) 160-9-4.8 MCG/ACT AERO Inhale 2 puffs into the lungs in the morning and at bedtime. 10.7 g 5   CORDRAN 4 MCG/SQCM TAPE Apply 1 each topically daily as needed (Rash).      cycloSPORINE (RESTASIS) 0.05 % ophthalmic emulsion Place 1 drop into both eyes 2 (two) times daily.      gabapentin (NEURONTIN) 300 MG capsule Take 1-2 capsules (300-600 mg total) by mouth 3 (three) times daily. 540 capsule 4   glucose blood (ACCU-CHEK SMARTVIEW) test strip CHECK BLOOD SUGAR ONCE DAILY AS NEEDED 100 strip 1   guaiFENesin-codeine 100-10 MG/5ML syrup Take 5 mLs by mouth every 6 (six) hours as needed for cough. 120 mL 0   hydrochlorothiazide (HYDRODIURIL) 12.5 MG tablet Take 1 tablet (12.5 mg total) by mouth daily. 90 tablet 3   hydrOXYzine (ATARAX/VISTARIL) 25 MG tablet Take 1 tablet (25 mg total) by mouth 3 (three) times daily as needed. 30 tablet 0   lidocaine (LIDODERM) 5 % Place 1 patch onto the skin every 12 (twelve) hours. Remove & Discard patch within 12 hours or as directed by MD 10 patch 0   losartan (COZAAR) 50 MG tablet Take 1 tablet (50 mg total) by mouth daily. 90 tablet 2   metoprolol tartrate (LOPRESSOR) 25 MG tablet Take 1.5 tablets (37.5 mg total) by mouth 2 (two) times daily. 120 tablet 3   Multiple Vitamins-Minerals (MULTIVITAMIN WITH MINERALS) tablet Take 1 tablet by mouth at bedtime.  nitroGLYCERIN  (NITROSTAT) 0.4 MG SL tablet Place 1 tablet (0.4 mg total) under the tongue every 5 (five) minutes as needed for chest pain. 25 tablet 11   omeprazole (PRILOSEC) 40 MG capsule Take 1 capsule (40 mg total) by mouth in the morning and at bedtime. 180 capsule 3   ondansetron (ZOFRAN) 8 MG tablet Take by mouth every 8 (eight) hours as needed for nausea or vomiting.     oxyCODONE-acetaminophen (PERCOCET) 10-325 MG tablet Take 1 tablet by mouth in the morning and at bedtime.     prasugrel (EFFIENT) 10 MG TABS tablet Take 1/2 tablet ( 5 Mg ) daily 45 tablet 3   tretinoin (RETIN-A) 0.1 % cream Apply 1 application topically every other day. In the evening     VITAMIN D, ERGOCALCIFEROL, PO Take 1,000 Units by mouth every evening.      isosorbide mononitrate (IMDUR) 30 MG 24 hr tablet Take 3 tablets ( 90 mg ) every morning 180 tablet 3   No current facility-administered medications for this visit.    Allergies:   Anticoagulant compound, Bextra [valdecoxib], Hydrocod polst-cpm polst er, Lipitor [atorvastatin], Mevacor [lovastatin], Plavix [clopidogrel bisulfate], Zocor [simvastatin], Doxycycline, Metoprolol, Morphine and related, Codeine, and Lisinopril    Social History:  The patient  reports that she quit smoking about 25 years ago. Her smoking use included cigarettes. She has never used smokeless tobacco. She reports current alcohol use of about 6.0 - 10.0 standard drinks per week. She reports that she does not use drugs.   Family History:  The patient's family history includes AAA (abdominal aortic aneurysm) in her brother; Breast cancer in her sister; Colon cancer in her cousin and paternal grandmother; Diabetes in her brother and sister; Heart attack in her brother, father, mother, and sister; Heart disease in her brother, brother, father, mother, and sister; Hyperlipidemia in her brother and sister; Hypertension in her brother and sister; Liver cancer in her sister.    ROS:  Please see the history of  present illness.   Otherwise, review of systems are positive for none.   All other systems are reviewed and negative.    PHYSICAL EXAM: VS:  BP (!) 124/58 (BP Location: Right Arm, Patient Position: Sitting, Cuff Size: Normal)   Pulse 62   Ht 5' (1.524 m)   Wt 126 lb 12.8 oz (57.5 kg)   SpO2 97%   BMI 24.76 kg/m  , BMI Body mass index is 24.76 kg/m. GENERAL:  Overweight  WF in NAD HEENT:  PERRL, EOMI, sclera are clear. Oropharynx is clear. NECK:  No jugular venous distention, carotid upstroke brisk and symmetric, no bruits, no thyromegaly or adenopathy LUNGS:  Clear to auscultation bilaterally CHEST:  Unremarkable HEART:  RRR,  PMI not displaced or sustained,S1 and S2 within normal limits, no S3, no S4: no clicks, no rubs, no murmurs ABD:  Soft, nontender. BS +, no masses or bruits. No hepatomegaly, no splenomegaly EXT:  1 + pulses throughout, no edema, no cyanosis no clubbing SKIN:  Warm and dry.  No rashes NEURO:  Alert and oriented x 3. Cranial nerves II through XII intact. PSYCH:  Cognitively intact     EKG:  EKG is done today. NSR rate 62, LVH, nonspecific ST changes. I have personally reviewed and interpreted this study.     Recent Labs: 01/08/2021: B Natriuretic Peptide 862.1 03/16/2021: Magnesium 1.6 04/20/2021: ALT 27 06/27/2021: TSH 0.714 08/27/2021: BUN 9; Creatinine, Ser 0.75; Hemoglobin 14.7; Platelets 247; Potassium 3.4; Sodium 136  Labs from primary care 12/15/16: A1c 5.6%. : Cholesterol 167 Trig-209, HDL 52, LDL 73.  AST 51, ALT 65. Normal renal indices. TSH is normal. Dated 06/24/17: CRP <0.3. Glucose 120, AST 58, ALT 80, other chemistries normal.  Dated 09/28/18: normal CBC, CMET  Lipid Panel    Component Value Date/Time   CHOL 142 06/25/2021 0408   CHOL 156 04/20/2021 0845   TRIG 100 06/25/2021 0408   HDL 49 06/25/2021 0408   HDL 59 04/20/2021 0845   CHOLHDL 2.9 06/25/2021 0408   VLDL 20 06/25/2021 0408   LDLCALC 73 06/25/2021 0408   LDLCALC 73  04/20/2021 0845   LDLDIRECT 82.0 06/13/2020 1017      Wt Readings from Last 3 Encounters:  08/31/21 126 lb 12.8 oz (57.5 kg)  07/22/21 127 lb 9.6 oz (57.9 kg)  07/06/21 126 lb 3.2 oz (57.2 kg)    Cardiac cath / PCI 02/02/21: CORONARY STENT INTERVENTION  LEFT HEART CATH AND CORS/GRAFTS ANGIOGRAPHY    Conclusion  Conclusions: Severe single-vessel coronary artery disease with chronic total occlusion of mid RCA. Mild to moderate, diffuse LAD disease with competitive flow in the distal vessel from LIMA-LAD. Small but patent LIMA-LAD. Patent SVG-rPDA-rPL with diffuse ostial/proximal disease of up to 60% and tandem hazy 95% and 90% mid graft lesions. Successful PCI to ostial/proximal and mid SVG-rPDA-rPL using non-overlapping Resolute Onyx 3.0 x 26 mm and 3.5 x 34 mm drug-eluting stents with 0% residual stenosis and TIMI-3 flow. Left forearm hematoma following sheath removal ant TR band placement, controlled with manual compression and placement of second TR band proximal to the first band.   Recommendations: Dual antiplatelet therapy with aspirin and ticagrelor for at least 12 months. Aggressive secondary prevention. Close monitoring of left forearm hematoma.   Nelva Bush, MD [facility]    Coronary Diagrams   Diagnostic Dominance: Right    Intervention     Implants       Echo 01/31/21: IMPRESSIONS     1. Left ventricular ejection fraction, by estimation, is 60 to 65%. The  left ventricle has normal function. The left ventricle has no regional  wall motion abnormalities. Left ventricular diastolic parameters are  consistent with Grade I diastolic  dysfunction (impaired relaxation).   2. Right ventricular systolic function is normal. The right ventricular  size is normal. There is normal pulmonary artery systolic pressure.   3. Left atrial size was severely dilated.   4. The mitral valve is grossly normal. Mild mitral valve regurgitation:  Apperance of two  jets; study may underestimated regurgitation severity.   5. The aortic valve is tricuspid. Aortic valve regurgitation is not  visualized. No aortic stenosis is present.   6. The inferior vena cava is normal in size with greater than 50%  respiratory variability, suggesting right atrial pressure of 3 mmHg.   Comparison(s): A prior study was performed on 01/09/21. No significant  change from prior study. Prior images reviewed side by side. Similar to  prior.    Cardiac cath 06/26/21: Procedures  CORONARY STENT INTERVENTION  Intravascular Ultrasound/IVUS  LEFT HEART CATH AND CORONARY ANGIOGRAPHY   Conclusion      Dist LM lesion is 15% stenosed.   Prox RCA lesion is 75% stenosed.   Mid RCA to Dist RCA lesion is 100% stenosed with 100% stenosed side branch in RPAV.   Origin to Prox Graft lesion before RPDA  is 80% stenosed.   Non-stenotic Mid Graft-1 lesion before RPDA was previously treated.   Non-stenotic  Mid Graft-2 lesion before RPDA was previously treated.   A drug-eluting stent was successfully placed using a STENT ONYX FRONTIER 3.5X26.   Post intervention, there is a 20% residual stenosis.   Seq SVG- and is large.   LIMA and is small.   There is competitive flow.   1.  Stable native vessel coronary artery disease with chronic total occlusion of the proximal to mid RCA, patency of the left main, diffuse mild to moderate calcific nonobstructive LAD stenosis, and moderate nonobstructive proximal left circumflex stenosis 2.  Continued patency of the LIMA to LAD 3.  Continued patency of the sequential saphenous vein graft to PDA and posterolateral branches with severe in-stent restenosis in the proximal stent, both lesions likely related to stent fracture 4.  Successful intravascular ultrasound-guided PCI of the saphenous vein graft to RCA using a 3.5 x 26 mm resolute Onyx DES postdilated to high pressure with a 4.0 mm noncompliant balloon.  Diagnostic Dominance:  Right Intervention  Implants   ASSESSMENT AND PLAN:  1. CAD status post CABG in 1995.  s/p NSTEMI in Feb. Cardiac cath demonstrated patent LIMA to the LAD. Occluded native RCA. SVG to PDA/PL had severe sequential stenoses treated with DES x 2. Normal EF.  Developed dyspnea on Brilinta. Allergic reaction to Plavix. Now on Prasugrel lower dose due to age and female sex. Had recurrent angina in July. Restenosis in the proximal stent in the SVG to RCA with some associated stent fracture. Had repeat stenting with IVUS guidance. Now 2 months later has recurrent angina on optimal medical therapy. She is at high risk for restenosis with prior stent fracture and the fact that graft is 77 years old. Recommend repeat cardiac cath. Not sure what more we can offer from PCI perspective if she does have restenosis. RCA is a fairly large territory. Would need to consider possible redo CABG but risk would be higher with redo and I'm not even sure this is even an option given aortic calcification. May need to consider all options with heart team approach if restenosis is there.   2. HTN with hypertensive heart disease. BP is under  good control. Now on metoprolol.  3. Hypercholesterolemia- LDL is at goal but triglycerides persistently elevated.   4.  Status post stent to right renal artery 2008. Patent by CT in October  5. Status post stent graft to abdominal aortic aneurysm 2009.  S/p iliac stent in 2016. Stable by CT in October  6. Peripheral artery disease followed by Dr. Donzetta Matters  7. Lumbar spine disease. S/p fusion with persistent pain. Unable to have a myelogram preferably for 6 months due to need for DAPT  Dispostion: cardiac cath this Thursday.   Signed, Kamora Vossler Martinique MD, [facility]    08/31/2021 11:06 AM    [place]

## 2021-08-28 NOTE — Telephone Encounter (Signed)
Spoke to patient she stated she went to ED yesterday.Stated Imdur was increased to 90 mg daily.Stated she is afraid to take 90 mg.She will continue 60 mg daily and keep appointment with Dr.Jordan Mon 9/26.

## 2021-08-28 NOTE — Telephone Encounter (Signed)
Patient returning call.

## 2021-08-28 NOTE — Telephone Encounter (Signed)
Returned call to patient left message on personal voice mail to call back. 

## 2021-08-28 NOTE — Telephone Encounter (Signed)
Patient is following up, requesting to speak with Dr. Doug Sou nurse. She states she went to the ED as advised and she would like to provide an update.

## 2021-08-28 NOTE — Progress Notes (Signed)
Cardiology Office Note   Date:  08/31/2021   ID:  Anahla, Bevis 07/19/1946, MRN [id number]  PCP:  [name], MD  Cardiologist: Preston Garabedian Martinique MD  Chief Complaint  Patient presents with   Coronary Artery Disease       History of Present Illness: Ariell Gunnels is a 75 y.o. female who is seen for follow up CAD.  She has a history  of CAD, HTN, HL, and PAD.   She is status post coronary artery bypass graft surgery in 1995- Dr Servando Snare. She had a renal artery stent for renal artery stenosis in 2008. She had a stent graft of an abdominal aortic aneurysm 2009. She is s/p stenting of the left iliac. She is followed by [name] for PAD. Last CT in May 2018. She had [place] in June 2019 showing no endoleak.    She was hospitalized in July 2014 and had cardiac catheterization which showed that her grafts were open. EF normal.  She was hospitalized 11/15/2014 with which he describes as an aching sensation in her chest.    She underwent a treadmill Myoview on 11/17/14 which showed no ischemia and her ejection fraction was 86%.   In June 2019 she had Abdominal [facility] at [facility] showing AAA of 4 cm.   In October 2019 she underwent right rotator cuff repair. No complications.   She was  having progressive issues with her low back that limited her activity and caused pain. She has exhausted conservative measures and underwent lumbar fusion in July by Dr Ronnald Ramp.   She was in the hospital  1/31 and discharged on 2/8- for AKI, diarrhea- diverticulitis (sigmoid colon), metabolic encephalopathy, UTI. Echo at that time was OK with normal EF and moderate TR.   She was re- admitted 2/26-02/03/21 with NSTEMI. Troponin up to 1085. Ecg showed significant inferolateral TWA. She underwent cardiac cath demonstrating patent LIMA to the LAD. The native RCA was occluded and she had severe sequential lesions in the SVG to RCA. This was treated with overlapping DES x 2- 3.0 x 26 and 3.5 x 34 mm Onyx stents.   Procedure was complicated by a left forearm hematoma. She was treated with DAPT with ASA and Brilinta. On metoprolol and high dose statin.   She was admitted for overnight observation on 02/11/21 after presenting with a localized ache in her left chest. This was different than when she presented with her NSTEMI. Ecg showed no new changes and troponin was negative x 3  On her last visit she complained of increased SOB- this was felt to be related to [place]. We switched to Plavix but she had an allergic reaction with rash and hives. Later switched to Prasugrel- reduced dose based on age and female.    She presented to the ED on 7/21 with complaints of chest pain over the past 2 days. Developed an aching sensation in the left side of her chest on Tuesday afternoon. Tried a SL nitro without relief. Also tried several reflux medications without much either. Symptoms lingered into the night and next morning she woke up with ongoing symptoms. Called the office and was instructed to present to the ED. States symptoms are exactly like what she experienced in the past with her previous cardia events.   She underwent repeat cardiac cath demonstrating restenosis of the SVG to RCA with clear evidence of prior stent fracture. She underwent repeat PCI with 3.5 x 26 mm Onyx stent post dilated to 4.0  with [place] balloon using IVUS guidance. Of note there were no collaterals to the distal RCA. There was significant aortic calcification. On July 28 she complained of some recurrent chest pain and her Imdur was increased. When seen in office on August 17 she was better. A sleep study ordered due to daytime somnolence.   She was seen in the ED on 08/27/21 with recurrent chest pain. Ecg showed no changes and troponins were negative. Reports pain the same as before. In discussion with cardiology fellow she was DC from ED with increased dose of Imdur to 90 mg daily and metoprolol to 37.5 mg bid. Seen today for follow up.   She  reports that initially post stent her symptoms improved. She has had infrequent angina until one week ago when symptoms clearly changed. Increase in medication has helped some.     Past Medical History:  Diagnosis Date   AAA (abdominal aortic aneurysm) ([place])    a. s/p stent grafting in 2009.   Adenomatous colon polyp 05/2001   Adenomatous duodenal polyp    Allergy    Anginal pain (HCC)    COPD (chronic obstructive pulmonary disease) ([place])    pt denies COPD   Coronary artery disease    a. s/p CABG in 1995;  b. 05/2013 Neg MV, EF 82%;  c. 06/2013 Cath: LM nl, LAD 40-50p, LCX nl, RCA 80p/125m, VG->RCA->PDA->PLSA min irregs, LIMA->LAD atretic, vigorous LV fxn.   Diabetes mellitus without complication (HCC)    history of, resolved. 2017   Diverticulosis    Eczema, dyshidrotic    Eczema, dyshidrotic 1986   Dr. Mathis Fare   Emphysema of lung [facility])    Fatty liver    Fibromyalgia    GERD (gastroesophageal reflux disease)    Hyperlipidemia    Hypertension    Migraine    Myocardial infarction [facility])    Osteoarthritis    Osteoporosis    unsure, having a bone scan soon   Peripheral arterial disease ([place])    left leg diagnosed by Dr Mare Ferrari   Renal artery stenosis [facility])    a. 2008 s/p PTA    Past Surgical History:  Procedure Laterality Date   ABDOMINAL AORTAGRAM N/A 03/06/2014   Procedure: ABDOMINAL AORTAGRAM;  Surgeon: Rosetta Posner, MD;  Location: [facility] CATH LAB;  Service: Cardiovascular;  Laterality: N/A;   ABDOMINAL AORTIC ANEURYSM REPAIR  2009   stenting   ABDOMINAL HYSTERECTOMY     Total, 1992   BREAST BIOPSY     CARDIAC CATHETERIZATION     CARDIOVASCULAR STRESS TEST  11/11/2009   normal study   CARPAL TUNNEL RELEASE  08/1993   left wrist   CARPAL TUNNEL RELEASE  01/1997   right   CATARACT EXTRACTION, BILATERAL  2021   CORONARY ARTERY BYPASS GRAFT  07/1994   Triple by Dr Ceasar Mons   CORONARY STENT INTERVENTION N/A 02/02/2021   Procedure: CORONARY STENT INTERVENTION;  Surgeon:  Nelva Bush, MD;  Location: [place] CV LAB;  Service: Cardiovascular;  Laterality: N/A;   CORONARY STENT INTERVENTION N/A 06/26/2021   Procedure: CORONARY STENT INTERVENTION;  Surgeon: Sherren Mocha, MD;  Location: [place] CV LAB;  Service: Cardiovascular;  Laterality: N/A;   dental implants     EYE SURGERY  03/2020   cataract   HAND SURGERY Right 09/2002   Right hand pulley release   INTRAVASCULAR ULTRASOUND/IVUS N/A 06/26/2021   Procedure: Intravascular Ultrasound/IVUS;  Surgeon: Sherren Mocha, MD;  Location: [place] CV LAB;  Service:  Cardiovascular;  Laterality: N/A;   LEFT HEART CATH AND CORONARY ANGIOGRAPHY N/A 06/26/2021   Procedure: LEFT HEART CATH AND CORONARY ANGIOGRAPHY;  Surgeon: Sherren Mocha, MD;  Location: [place] CV LAB;  Service: Cardiovascular;  Laterality: N/A;   LEFT HEART CATH AND CORS/GRAFTS ANGIOGRAPHY N/A 02/02/2021   Procedure: LEFT HEART CATH AND CORS/GRAFTS ANGIOGRAPHY;  Surgeon: Nelva Bush, MD;  Location: [place] CV LAB;  Service: Cardiovascular;  Laterality: N/A;   LEFT HEART CATHETERIZATION WITH CORONARY/GRAFT ANGIOGRAM N/A 06/11/2013   Procedure: LEFT HEART CATHETERIZATION WITH [name];  Surgeon: Thayer Headings, MD;  Location: [facility] CATH LAB;  Service: Cardiovascular;  Laterality: N/A;   MULTIPLE TOOTH EXTRACTIONS  2000   All upper teeth.  Has full dneture.    RENAL ARTERY STENT  2008   SHOULDER ARTHROSCOPY WITH ROTATOR CUFF REPAIR Right 09/27/2018   Procedure: Right shoulder mini open rotator cuff repair;  Surgeon: Susa Day, MD;  Location: [facility] ORS;  Service: Orthopedics;  Laterality: Right;  90 mins   thyroid cyst apiration  05/1997 and 07/1998   TONSILECTOMY, ADENOIDECTOMY, BILATERAL MYRINGOTOMY AND TUBES  1989   TONSILLECTOMY     TOTAL ABDOMINAL HYSTERECTOMY  1992     Current Outpatient Medications  Medication Sig Dispense Refill   Accu-Chek FastClix Lancets MISC Check blood sugar once daily PRN 100 each 1    albuterol (PROVENTIL HFA;VENTOLIN HFA) 108 (90 Base) MCG/ACT inhaler Inhale 2 puffs into the lungs every 4 (four) hours as needed for wheezing or shortness of breath. 1 Inhaler 2   ALPRAZolam (XANAX) 0.25 MG tablet Take 1 tablet (0.25 mg total) by mouth at bedtime as needed for sleep. 90 tablet 3   aspirin EC 81 MG tablet Take 1 tablet (81 mg total) by mouth daily. 90 tablet 3   atorvastatin (LIPITOR) 80 MG tablet TAKE 1 TABLET BY MOUTH EVERY DAY 90 tablet 3   B Complex-Biotin-FA (B-100 COMPLEX PO) Take 1 tablet by mouth at bedtime.      Budeson-Glycopyrrol-Formoterol (BREZTRI AEROSPHERE) 160-9-4.8 MCG/ACT AERO Inhale 2 puffs into the lungs in the morning and at bedtime. 10.7 g 5   CORDRAN 4 MCG/SQCM TAPE Apply 1 each topically daily as needed (Rash).      cycloSPORINE (RESTASIS) 0.05 % ophthalmic emulsion Place 1 drop into both eyes 2 (two) times daily.      gabapentin (NEURONTIN) 300 MG capsule Take 1-2 capsules (300-600 mg total) by mouth 3 (three) times daily. 540 capsule 4   glucose blood (ACCU-CHEK SMARTVIEW) test strip CHECK BLOOD SUGAR ONCE DAILY AS NEEDED 100 strip 1   guaiFENesin-codeine 100-10 MG/5ML syrup Take 5 mLs by mouth every 6 (six) hours as needed for cough. 120 mL 0   hydrochlorothiazide (HYDRODIURIL) 12.5 MG tablet Take 1 tablet (12.5 mg total) by mouth daily. 90 tablet 3   hydrOXYzine (ATARAX/VISTARIL) 25 MG tablet Take 1 tablet (25 mg total) by mouth 3 (three) times daily as needed. 30 tablet 0   lidocaine (LIDODERM) 5 % Place 1 patch onto the skin every 12 (twelve) hours. Remove & Discard patch within 12 hours or as directed by MD 10 patch 0   losartan (COZAAR) 50 MG tablet Take 1 tablet (50 mg total) by mouth daily. 90 tablet 2   metoprolol tartrate (LOPRESSOR) 25 MG tablet Take 1.5 tablets (37.5 mg total) by mouth 2 (two) times daily. 120 tablet 3   Multiple Vitamins-Minerals (MULTIVITAMIN WITH MINERALS) tablet Take 1 tablet by mouth at bedtime.  nitroGLYCERIN  (NITROSTAT) 0.4 MG SL tablet Place 1 tablet (0.4 mg total) under the tongue every 5 (five) minutes as needed for chest pain. 25 tablet 11   omeprazole (PRILOSEC) 40 MG capsule Take 1 capsule (40 mg total) by mouth in the morning and at bedtime. 180 capsule 3   ondansetron (ZOFRAN) 8 MG tablet Take by mouth every 8 (eight) hours as needed for nausea or vomiting.     oxyCODONE-acetaminophen (PERCOCET) 10-325 MG tablet Take 1 tablet by mouth in the morning and at bedtime.     prasugrel (EFFIENT) 10 MG TABS tablet Take 1/2 tablet ( 5 Mg ) daily 45 tablet 3   tretinoin (RETIN-A) 0.1 % cream Apply 1 application topically every other day. In the evening     VITAMIN D, ERGOCALCIFEROL, PO Take 1,000 Units by mouth every evening.      isosorbide mononitrate (IMDUR) 30 MG 24 hr tablet Take 3 tablets ( 90 mg ) every morning 180 tablet 3   No current facility-administered medications for this visit.    Allergies:   Anticoagulant compound, Bextra [valdecoxib], Hydrocod polst-cpm polst er, Lipitor [atorvastatin], Mevacor [lovastatin], Plavix [clopidogrel bisulfate], Zocor [simvastatin], Doxycycline, Metoprolol, Morphine and related, Codeine, and Lisinopril    Social History:  The patient  reports that she quit smoking about 25 years ago. Her smoking use included cigarettes. She has never used smokeless tobacco. She reports current alcohol use of about 6.0 - 10.0 standard drinks per week. She reports that she does not use drugs.   Family History:  The patient's family history includes AAA (abdominal aortic aneurysm) in her brother; Breast cancer in her sister; Colon cancer in her cousin and paternal grandmother; Diabetes in her brother and sister; Heart attack in her brother, father, mother, and sister; Heart disease in her brother, brother, father, mother, and sister; Hyperlipidemia in her brother and sister; Hypertension in her brother and sister; Liver cancer in her sister.    ROS:  Please see the history of  present illness.   Otherwise, review of systems are positive for none.   All other systems are reviewed and negative.    PHYSICAL EXAM: VS:  BP (!) 124/58 (BP Location: Right Arm, Patient Position: Sitting, Cuff Size: Normal)   Pulse 62   Ht 5' (1.524 m)   Wt 126 lb 12.8 oz (57.5 kg)   SpO2 97%   BMI 24.76 kg/m  , BMI Body mass index is 24.76 kg/m. GENERAL:  Overweight  WF in NAD HEENT:  PERRL, EOMI, sclera are clear. Oropharynx is clear. NECK:  No jugular venous distention, carotid upstroke brisk and symmetric, no bruits, no thyromegaly or adenopathy LUNGS:  Clear to auscultation bilaterally CHEST:  Unremarkable HEART:  RRR,  PMI not displaced or sustained,S1 and S2 within normal limits, no S3, no S4: no clicks, no rubs, no murmurs ABD:  Soft, nontender. BS +, no masses or bruits. No hepatomegaly, no splenomegaly EXT:  1 + pulses throughout, no edema, no cyanosis no clubbing SKIN:  Warm and dry.  No rashes NEURO:  Alert and oriented x 3. Cranial nerves II through XII intact. PSYCH:  Cognitively intact     EKG:  EKG is done today. NSR rate 62, LVH, nonspecific ST changes. I have personally reviewed and interpreted this study.     Recent Labs: 01/08/2021: B Natriuretic Peptide 862.1 03/16/2021: Magnesium 1.6 04/20/2021: ALT 27 06/27/2021: TSH 0.714 08/27/2021: BUN 9; Creatinine, Ser 0.75; Hemoglobin 14.7; Platelets 247; Potassium 3.4; Sodium 136  Labs from primary care 12/15/16: A1c 5.6%. : Cholesterol 167 Trig-209, HDL 52, LDL 73.  AST 51, ALT 65. Normal renal indices. TSH is normal. Dated 06/24/17: CRP <0.3. Glucose 120, AST 58, ALT 80, other chemistries normal.  Dated 09/28/18: normal CBC, CMET  Lipid Panel    Component Value Date/Time   CHOL 142 06/25/2021 0408   CHOL 156 04/20/2021 0845   TRIG 100 06/25/2021 0408   HDL 49 06/25/2021 0408   HDL 59 04/20/2021 0845   CHOLHDL 2.9 06/25/2021 0408   VLDL 20 06/25/2021 0408   LDLCALC 73 06/25/2021 0408   LDLCALC 73  04/20/2021 0845   LDLDIRECT 82.0 06/13/2020 1017      Wt Readings from Last 3 Encounters:  08/31/21 126 lb 12.8 oz (57.5 kg)  07/22/21 127 lb 9.6 oz (57.9 kg)  07/06/21 126 lb 3.2 oz (57.2 kg)    Cardiac cath / PCI 02/02/21: CORONARY STENT INTERVENTION  LEFT HEART CATH AND CORS/GRAFTS ANGIOGRAPHY    Conclusion  Conclusions: Severe single-vessel coronary artery disease with chronic total occlusion of mid RCA. Mild to moderate, diffuse LAD disease with competitive flow in the distal vessel from LIMA-LAD. Small but patent LIMA-LAD. Patent SVG-rPDA-rPL with diffuse ostial/proximal disease of up to 60% and tandem hazy 95% and 90% mid graft lesions. Successful PCI to ostial/proximal and mid SVG-rPDA-rPL using non-overlapping Resolute Onyx 3.0 x 26 mm and 3.5 x 34 mm drug-eluting stents with 0% residual stenosis and TIMI-3 flow. Left forearm hematoma following sheath removal ant TR band placement, controlled with manual compression and placement of second TR band proximal to the first band.   Recommendations: Dual antiplatelet therapy with aspirin and ticagrelor for at least 12 months. Aggressive secondary prevention. Close monitoring of left forearm hematoma.   Nelva Bush, MD [facility]    Coronary Diagrams   Diagnostic Dominance: Right    Intervention     Implants       Echo 01/31/21: IMPRESSIONS     1. Left ventricular ejection fraction, by estimation, is 60 to 65%. The  left ventricle has normal function. The left ventricle has no regional  wall motion abnormalities. Left ventricular diastolic parameters are  consistent with Grade I diastolic  dysfunction (impaired relaxation).   2. Right ventricular systolic function is normal. The right ventricular  size is normal. There is normal pulmonary artery systolic pressure.   3. Left atrial size was severely dilated.   4. The mitral valve is grossly normal. Mild mitral valve regurgitation:  Apperance of two  jets; study may underestimated regurgitation severity.   5. The aortic valve is tricuspid. Aortic valve regurgitation is not  visualized. No aortic stenosis is present.   6. The inferior vena cava is normal in size with greater than 50%  respiratory variability, suggesting right atrial pressure of 3 mmHg.   Comparison(s): A prior study was performed on 01/09/21. No significant  change from prior study. Prior images reviewed side by side. Similar to  prior.    Cardiac cath 06/26/21: Procedures  CORONARY STENT INTERVENTION  Intravascular Ultrasound/IVUS  LEFT HEART CATH AND CORONARY ANGIOGRAPHY   Conclusion      Dist LM lesion is 15% stenosed.   Prox RCA lesion is 75% stenosed.   Mid RCA to Dist RCA lesion is 100% stenosed with 100% stenosed side branch in RPAV.   Origin to Prox Graft lesion before RPDA  is 80% stenosed.   Non-stenotic Mid Graft-1 lesion before RPDA was previously treated.   Non-stenotic  Mid Graft-2 lesion before RPDA was previously treated.   A drug-eluting stent was successfully placed using a STENT ONYX FRONTIER 3.5X26.   Post intervention, there is a 20% residual stenosis.   Seq SVG- and is large.   LIMA and is small.   There is competitive flow.   1.  Stable native vessel coronary artery disease with chronic total occlusion of the proximal to mid RCA, patency of the left main, diffuse mild to moderate calcific nonobstructive LAD stenosis, and moderate nonobstructive proximal left circumflex stenosis 2.  Continued patency of the LIMA to LAD 3.  Continued patency of the sequential saphenous vein graft to PDA and posterolateral branches with severe in-stent restenosis in the proximal stent, both lesions likely related to stent fracture 4.  Successful intravascular ultrasound-guided PCI of the saphenous vein graft to RCA using a 3.5 x 26 mm resolute Onyx DES postdilated to high pressure with a 4.0 mm noncompliant balloon.  Diagnostic Dominance:  Right Intervention  Implants   ASSESSMENT AND PLAN:  1. CAD status post CABG in 1995.  s/p NSTEMI in Feb. Cardiac cath demonstrated patent LIMA to the LAD. Occluded native RCA. SVG to PDA/PL had severe sequential stenoses treated with DES x 2. Normal EF.  Developed dyspnea on Brilinta. Allergic reaction to Plavix. Now on Prasugrel lower dose due to age and female sex. Had recurrent angina in July. Restenosis in the proximal stent in the SVG to RCA with some associated stent fracture. Had repeat stenting with IVUS guidance. Now 2 months later has recurrent angina on optimal medical therapy. She is at high risk for restenosis with prior stent fracture and the fact that graft is 77 years old. Recommend repeat cardiac cath. Not sure what more we can offer from PCI perspective if she does have restenosis. RCA is a fairly large territory. Would need to consider possible redo CABG but risk would be higher with redo and I'm not even sure this is even an option given aortic calcification. May need to consider all options with heart team approach if restenosis is there.   2. HTN with hypertensive heart disease. BP is under  good control. Now on metoprolol.  3. Hypercholesterolemia- LDL is at goal but triglycerides persistently elevated.   4.  Status post stent to right renal artery 2008. Patent by CT in October  5. Status post stent graft to abdominal aortic aneurysm 2009.  S/p iliac stent in 2016. Stable by CT in October  6. Peripheral artery disease followed by Dr. Donzetta Matters  7. Lumbar spine disease. S/p fusion with persistent pain. Unable to have a myelogram preferably for 6 months due to need for DAPT  Dispostion: cardiac cath this Thursday.   Signed, Shahzad Thomann Martinique MD, [facility]    08/31/2021 11:06 AM    [place]

## 2021-08-31 ENCOUNTER — Other Ambulatory Visit: Payer: Self-pay

## 2021-08-31 ENCOUNTER — Ambulatory Visit (INDEPENDENT_AMBULATORY_CARE_PROVIDER_SITE_OTHER): Payer: Medicare Other | Admitting: Cardiology

## 2021-08-31 ENCOUNTER — Encounter: Payer: Self-pay | Admitting: Cardiology

## 2021-08-31 VITALS — BP 124/58 | HR 62 | Ht 60.0 in | Wt 126.8 lb

## 2021-08-31 DIAGNOSIS — I25708 Atherosclerosis of coronary artery bypass graft(s), unspecified, with other forms of angina pectoris: Secondary | ICD-10-CM | POA: Diagnosis not present

## 2021-08-31 DIAGNOSIS — R072 Precordial pain: Secondary | ICD-10-CM

## 2021-08-31 DIAGNOSIS — E782 Mixed hyperlipidemia: Secondary | ICD-10-CM | POA: Diagnosis not present

## 2021-08-31 DIAGNOSIS — E119 Type 2 diabetes mellitus without complications: Secondary | ICD-10-CM

## 2021-08-31 DIAGNOSIS — I25700 Atherosclerosis of coronary artery bypass graft(s), unspecified, with unstable angina pectoris: Secondary | ICD-10-CM

## 2021-08-31 DIAGNOSIS — Z01818 Encounter for other preprocedural examination: Secondary | ICD-10-CM

## 2021-08-31 DIAGNOSIS — I739 Peripheral vascular disease, unspecified: Secondary | ICD-10-CM | POA: Diagnosis not present

## 2021-08-31 DIAGNOSIS — Z01812 Encounter for preprocedural laboratory examination: Secondary | ICD-10-CM | POA: Diagnosis not present

## 2021-08-31 DIAGNOSIS — I119 Hypertensive heart disease without heart failure: Secondary | ICD-10-CM

## 2021-08-31 MED ORDER — ISOSORBIDE MONONITRATE ER 30 MG PO TB24: ORAL_TABLET | ORAL | 3 refills | Status: DC

## 2021-08-31 NOTE — Patient Instructions (Addendum)
Medication Instructions:  Continue all medications   Lab Work: Bmet,cbc,pt today   Testing/Procedures: Cardiac Cath   Follow instructions below   Follow-Up: At [facility], you and your health needs are our priority.  As part of our continuing mission to provide you with exceptional heart care, we have created designated Provider Care Teams.  These Care Teams include your primary Cardiologist (physician) and Advanced Practice Providers (APPs -  Physician Assistants and Nurse Practitioners) who all work together to provide you with the care you need, when you need it.  We recommend signing up for the patient portal called "MyChart".  Sign up information is provided on this After Visit Summary.  MyChart is used to connect with patients for Virtual Visits (Telemedicine).  Patients are able to view lab/test results, encounter notes, upcoming appointments, etc.  Non-urgent messages can be sent to your provider as well.   To learn more about what you can do with MyChart, go to [email or web address].     Your next appointment: 10/19 at 10:00 am   The format for your next appointment: Office   Provider:  [place] [address] Dept: [phone] Loc: [phone]  Megan [name]  08/31/2021  You are scheduled for a Cardiac Cathon Thursday 09/03/21, with Dr.Jordan.  1. Please arrive at the [facility] (Main Entrance A) at [facility]: [address] [place], [place] [address] at 8:30 am(This time is two hours before your procedure to ensure your preparation). Free valet parking service is available.   Special note: Every effort is made to have your procedure done on time. Please understand that emergencies sometimes delay scheduled procedures.  2. Diet: Do not eat solid foods after midnight.  The patient may have clear liquids until 5am upon  the day of the procedure.  3. Labs: You will need to have blood drawn on Monday 9/26.You do not need to be fasting.  4. Medication instructions in preparation for your procedure:       Hold HCTZ morning of cath     On the morning of your procedure, take your Effient and Aspirin and any morning medicines NOT listed above.  You may use sips of water.  5. Plan for one night stay--bring personal belongings. 6. Bring a current list of your medications and current insurance cards. 7. You MUST have a responsible person to drive you home. 8. Someone MUST be with you the first 24 hours after you arrive home or your discharge will be delayed. 9. Please wear clothes that are easy to get on and off and wear slip-on shoes.  Thank you for allowing [place] to care for you!   -- [facility]

## 2021-09-01 ENCOUNTER — Other Ambulatory Visit: Payer: Self-pay | Admitting: Cardiology

## 2021-09-01 DIAGNOSIS — I200 Unstable angina: Secondary | ICD-10-CM

## 2021-09-01 LAB — CBC WITH DIFFERENTIAL/PLATELET
Basophils Absolute: 0 10*3/uL (ref 0.0–0.2)
Basos: 0 %
EOS (ABSOLUTE): 0.1 10*3/uL (ref 0.0–0.4)
Eos: 2 %
Hematocrit: 46.4 % (ref 34.0–46.6)
Hemoglobin: 15.1 g/dL (ref 11.1–15.9)
Immature Grans (Abs): 0.1 10*3/uL (ref 0.0–0.1)
Immature Granulocytes: 1 %
Lymphocytes Absolute: 2.1 10*3/uL (ref 0.7–3.1)
Lymphs: 23 %
MCH: 30.9 pg (ref 26.6–33.0)
MCHC: 32.5 g/dL (ref 31.5–35.7)
MCV: 95 fL (ref 79–97)
Monocytes Absolute: 0.6 10*3/uL (ref 0.1–0.9)
Monocytes: 6 %
Neutrophils Absolute: 6.3 10*3/uL (ref 1.4–7.0)
Neutrophils: 68 %
Platelets: 235 10*3/uL (ref 150–450)
RBC: 4.89 x10E6/uL (ref 3.77–5.28)
RDW: 11.9 % (ref 11.7–15.4)
WBC: 9.2 10*3/uL (ref 3.4–10.8)

## 2021-09-01 LAB — PT AND PTT
INR: 1 (ref 0.9–1.2)
Prothrombin Time: 10.1 s (ref 9.1–12.0)
aPTT: 29 s (ref 24–33)

## 2021-09-01 LAB — BASIC METABOLIC PANEL
BUN/Creatinine Ratio: 13 (ref 12–28)
BUN: 8 mg/dL (ref 8–27)
CO2: 25 mmol/L (ref 20–29)
Calcium: 10 mg/dL (ref 8.7–10.3)
Chloride: 100 mmol/L (ref 96–106)
Creatinine, Ser: 0.6 mg/dL (ref 0.57–1.00)
Glucose: 97 mg/dL (ref 70–99)
Potassium: 4.2 mmol/L (ref 3.5–5.2)
Sodium: 142 mmol/L (ref 134–144)
eGFR: 92 mL/min/{1.73_m2} (ref >59)

## 2021-09-01 MED ORDER — SODIUM CHLORIDE 0.9% FLUSH: 3.0000 mL | Freq: Two times a day (BID) | INTRAVENOUS | Status: DC

## 2021-09-02 ENCOUNTER — Other Ambulatory Visit: Payer: Self-pay | Admitting: *Deleted

## 2021-09-02 ENCOUNTER — Ambulatory Visit: Payer: Medicare Other | Admitting: Family Medicine

## 2021-09-02 ENCOUNTER — Telehealth: Payer: Self-pay | Admitting: *Deleted

## 2021-09-02 ENCOUNTER — Encounter: Payer: Self-pay | Admitting: Family Medicine

## 2021-09-02 VITALS — BP 122/69 | HR 76 | Ht 60.0 in | Wt 127.0 lb

## 2021-09-02 DIAGNOSIS — I25709 Atherosclerosis of coronary artery bypass graft(s), unspecified, with unspecified angina pectoris: Secondary | ICD-10-CM

## 2021-09-02 DIAGNOSIS — G43109 Migraine with aura, not intractable, without status migrainosus: Secondary | ICD-10-CM

## 2021-09-02 DIAGNOSIS — R079 Chest pain, unspecified: Secondary | ICD-10-CM | POA: Diagnosis not present

## 2021-09-02 MED ORDER — GABAPENTIN 300 MG PO CAPS: 300.0000 mg | ORAL_CAPSULE | Freq: Three times a day (TID) | ORAL | 3 refills | Status: DC

## 2021-09-02 MED ORDER — ISOSORBIDE MONONITRATE ER 30 MG PO TB24: ORAL_TABLET | ORAL | 3 refills | Status: DC

## 2021-09-02 NOTE — Patient Instructions (Signed)
Below is our plan:  We will continue gabapentin 300mg  2-3 times daily.   Please make sure you are staying well hydrated. I recommend 50-60 ounces daily. Well balanced diet and regular exercise encouraged. Consistent sleep schedule with 6-8 hours recommended.   Please continue follow up with care team as directed.   Follow up with me as needed, may continue refills with PCP.   You may receive a survey regarding today's visit. I encourage you to leave honest feed back as I do use this information to improve patient care. Thank you for seeing me today!

## 2021-09-02 NOTE — Telephone Encounter (Signed)
Cardiac catheterization scheduled at [facility] for: Thursday September 03, 2021 10:30 AM [facility] Main Entrance A [facility]) at: 8:30 AM   No solid food after midnight prior to cath, clear liquids until 5 AM day of procedure.  Medication instructions: Hold: HCTZ-AM of procedure   Except hold medications usual morning medications can be taken pre-cath with sips of water including: -aspirin 81 mg -Effient 10 mg    Confirmed patient has responsible adult to drive home post procedure and be with patient first 24 hours after arriving home.  [facility] does allow one visitor to accompany you and wait in the hospital waiting room while you are there for your procedure. You and your visitor will be asked to wear a mask once you enter the hospital.   Patient reports does not currently have any symptoms concerning for COVID-19 and no household members with COVID-19 like illness.      Reviewed procedure/mask/visitor instructions with patient.

## 2021-09-02 NOTE — Progress Notes (Signed)
PATIENT: Megan [name] DOB: 07-02-1946  REASON FOR VISIT: follow up HISTORY FROM: patient  Chief Complaint  Patient presents with   Follow-up    Rm 10, alone. Here for migraine medication management. Needs refill on her gabapentin. Migraines are rare, last migraine was 3 months ago. Pt had a heart attack on 3/22 and had stents placed. On 7/22, pt had to go back to surgery to her stent fixed.       HISTORY OF PRESENT ILLNESS: 09/02/21 ALL: Eron returns for follow up for headaches and paresthesias. She was last seen 11/2019 and symptoms were improved on gabapentin. She was advised to continue gabapentin 300-600mg  TID. She usually takes 300mg  in the morning and 300mg  at bedtime. She rarely has a headache. Jaw pain has resolved. She was started on oxycodone 10/325mg  BID-TID. She is followed regularly by PCP and NS.   She has a consult appt with Dr Leta Baptist in 10/2021 for concerns of gait instability. Balance difficulty noted following back surgery about a year ago. PT helped some for a few weeks but she feels imbalance continues to be a concern. She reports feeling dizzy and off balance all the time. She feels that her legs give out on her "all the time". She continues to have chronic low back pain. She is able to walk 1-2 miles daily for exercise. She uses a cane but reports that she usually does not need it. No falls. No numbness/tingling in feet.   She has significant cardiac history with CABG in 1995, AAA graft in 2009, NSTEMI with cardiac arrest and stenting in 2018. She has recent concerns of chest discomfort and cardiology is adjusting medicaitons. She seems to be doing better with increased dose of Imdur to 30mg  daily. Unable to tolerate Brilinta and Plavix. Now on asa 81mg  and Effient. She takes atorvastatin 80mg , losartan 50mg  and metoprolol 37.5mg  daily. She is scheduled for repeat heart cath tomorrow. She reports BP is usually normal at home. 135-140/78-82 at home.   11/15/2019  ALL:  Megan [name] is a 75 y.o. female here today for follow up of headaches and paresthesias.. She usually takes 2 tablets of gabapentin but will take an extra dose as needed. Lip burning and tingling persists but it is manageable. She does feel that gabapentin helps with right jaw pain. She has noted more headaches over the past two months. She notes change in vision just prior to a headache. She describes it as "blurry vision." No distinct lines or flashing lights. She will take an 325mg  aspirin when she notes blurry vision.  She takes a daily dose of 325 mg aspirin for stroke prevention.  She denies any gastric concerns or unusual bleeding.   HISTORY: (copied from Dr Gladstone Lighter note on 11/14/2018)  UPDATE (11/14/18, VRP): Since last visit, doing well. Symptoms (implant pain improved; numbness / burning is stable) are improved with gabapentin 300mg  three times a day. Severity is moderate. No alleviating or aggravating factors. Tolerating meds. No headaches.    UPDATE (05/09/18, VRP): Since last visit, doing well and migraine resolved on their own. No alleviating or aggravating factors.    Now with new onset of bilateral upper lip burning sensation and increased lower lip tingling since new dentures (upper and lower) since Jan 2019. Upper lip burning worse with patient has dentures in place, and slightly reduced when dentures taken out over night. Symptoms initially improving with denture adjustments, but now symptoms are slightly worse in last 2-3 weeks.  PRIOR HPI (08/10/17): 75 year old female here for evaluation of headaches.   Patient had onset of headaches around age 41 years old, sometimes preceded by seeing wavy lines of water. Headaches would be unilateral or bilateral, sometimes associated with unilateral hemibody numbness, sometimes a fascia, with headaches lasting up to one dated time. Patient had one or 2 headaches per year. After she had a hysterectomy in 1992 her headaches significant  improved. Patient was never officially diagnosed with migraine headaches but suspected that she had migraines due to her sister's history of migraines and her paternal grandmother's history of migraines. Patient never tried prescription migraine medication.   06/15/2017 patient had cold sensation and chills. She then had headache which continued until the next day. This reminded her of her previous headaches earlier in life. Headache recurred another day later and lasted for 5 weeks continuously. She had 2 episodes of visual aura. She then had headaches from August 20 until the 30th. She had another headache from September 2 of September 5. No nausea vomiting. No photophobia or phonophobia. She has had off-balance sensation. She has had left-sided headaches ranging from 3 out of 10 in severity. She has tried Midrin and Fioricet without relief. She is tried ibuprofen and Tylenol without relief.   Initially patient cannot identify any triggering factors. However 1 week prior to onset of headaches she had a tick bite. Also starting in May 2018 through July 2018, patient had significant dental and oral surgery procedures including 5 oral surgery implants as well as a bone spur removal. She has been left with residual post surgical numbness in her right lower lip and chin.   Also in the end of July 2018 patient had sudden onset of hearing loss in her right ear. No other associated symptoms. Patient went to ENT for evaluation. Patient was treated empirically with prednisone, acyclovir and doxycycline. Her hearing loss has significantly improved.   REVIEW OF SYSTEMS: Out of a complete 14 system review of symptoms, the patient complains only of the following symptoms, imbalance reported and all other reviewed systems are negative.  ALLERGIES: Allergies  Allergen Reactions   Brilinta [Ticagrelor] Shortness Of Breath   Bextra [Valdecoxib] Other (See Comments)    Increased lft's    Hydrocod Polst-Cpm Polst Er  Rash   Lipitor [Atorvastatin] Other (See Comments)    Leg weakness    Mevacor [Lovastatin] Other (See Comments)    Muscle weakness    Plavix [Clopidogrel Bisulfate] Itching and Other (See Comments)    Hands and feet tingle and itch   Zocor [Simvastatin] Other (See Comments)    Bones hurt    Doxycycline Diarrhea and Nausea And Vomiting   Metoprolol Other (See Comments)    Hair loss   Morphine And Related Itching and Nausea Only   Codeine Itching and Rash    Hyper-active   Lisinopril Cough    HOME MEDICATIONS: Outpatient Medications Prior to Visit  Medication Sig Dispense Refill   Accu-Chek FastClix Lancets MISC Check blood sugar once daily PRN 100 each 1   ALPRAZolam (XANAX) 0.25 MG tablet Take 1 tablet (0.25 mg total) by mouth at bedtime as needed for sleep. 90 tablet 3   aspirin EC 81 MG tablet Take 1 tablet (81 mg total) by mouth daily. 90 tablet 3   atorvastatin (LIPITOR) 80 MG tablet TAKE 1 TABLET BY MOUTH EVERY DAY 90 tablet 3   B Complex-Biotin-FA (B-100 COMPLEX PO) Take 1 tablet by mouth every evening.     bismuth  subsalicylate (PEPTO BISMOL) 262 MG/15ML suspension Take 30 mLs by mouth every 6 (six) hours as needed.     Budeson-Glycopyrrol-Formoterol (BREZTRI AEROSPHERE) 160-9-4.8 MCG/ACT AERO Inhale 2 puffs into the lungs in the morning and at bedtime. 10.7 g 5   cetirizine (ZYRTEC) 10 MG tablet Take 10 mg by mouth daily as needed for allergies.     cholecalciferol (VITAMIN D3) 25 MCG (1000 UNIT) tablet Take 1,000 Units by mouth every evening.     CORDRAN 4 MCG/SQCM TAPE Apply 1 each topically daily as needed (Rash).      cycloSPORINE (RESTASIS) 0.05 % ophthalmic emulsion Place 1 drop into both eyes 2 (two) times daily.      glucose blood (ACCU-CHEK SMARTVIEW) test strip CHECK BLOOD SUGAR ONCE DAILY AS NEEDED 100 strip 1   hydrochlorothiazide (HYDRODIURIL) 12.5 MG tablet Take 1 tablet (12.5 mg total) by mouth daily. 90 tablet 3   isosorbide mononitrate (IMDUR) 30 MG 24 hr  tablet Take 3 tablets ( 90 mg ) every morning 270 tablet 3   losartan (COZAAR) 50 MG tablet Take 1 tablet (50 mg total) by mouth daily. 90 tablet 2   metoprolol tartrate (LOPRESSOR) 25 MG tablet Take 1.5 tablets (37.5 mg total) by mouth 2 (two) times daily. 120 tablet 3   Multiple Vitamins-Minerals (MULTIVITAMIN WITH MINERALS) tablet Take 1 tablet by mouth every evening.     nitroGLYCERIN (NITROSTAT) 0.4 MG SL tablet Place 1 tablet (0.4 mg total) under the tongue every 5 (five) minutes as needed for chest pain. 25 tablet 11   omeprazole (PRILOSEC) 40 MG capsule Take 1 capsule (40 mg total) by mouth in the morning and at bedtime. 180 capsule 3   oxyCODONE-acetaminophen (PERCOCET) 10-325 MG tablet Take 1 tablet by mouth See admin instructions. Take 1 tablet twice daily, may take a third tablet as needed for pain     prasugrel (EFFIENT) 10 MG TABS tablet Take 1/2 tablet ( 5 Mg ) daily 45 tablet 3   prasugrel (EFFIENT) 5 MG TABS tablet Take 5 mg by mouth daily.     PRESCRIPTION MEDICATION Place 2 puffs into both ears daily as needed (ear infections). [facility] ear powder     Simethicone (GAS-X PO) Take 1-2 tablets by mouth daily as needed (gas).     sodium chloride (OCEAN) 0.65 % SOLN nasal spray Place 1 spray into both nostrils as needed for congestion.     tretinoin (RETIN-A) 0.1 % cream Apply 1 application topically every other day. In the evening     gabapentin (NEURONTIN) 300 MG capsule Take 1-2 capsules (300-600 mg total) by mouth 3 (three) times daily. (Patient taking differently: Take 300 mg by mouth 2 (two) times daily.) 540 capsule 4   albuterol (PROVENTIL HFA;VENTOLIN HFA) 108 (90 Base) MCG/ACT inhaler Inhale 2 puffs into the lungs every 4 (four) hours as needed for wheezing or shortness of breath. 1 Inhaler 2   aspirin EC 325 MG tablet Take 650 mg by mouth every 6 (six) hours as needed for moderate pain.     guaiFENesin-codeine 100-10 MG/5ML syrup Take 5 mLs by mouth every 6 (six) hours as needed  for cough. (Patient not taking: Reported on 08/31/2021) 120 mL 0   hydrOXYzine (ATARAX/VISTARIL) 25 MG tablet Take 1 tablet (25 mg total) by mouth 3 (three) times daily as needed. (Patient not taking: Reported on 08/31/2021) 30 tablet 0   lidocaine (LIDODERM) 5 % Place 1 patch onto the skin every 12 (twelve) hours. Remove & Discard  patch within 12 hours or as directed by MD (Patient not taking: Reported on 08/31/2021) 10 patch 0   Facility-Administered Medications Prior to Visit  Medication Dose Route Frequency Provider Last Rate Last Admin   sodium chloride flush (NS) 0.9 % injection 3 mL  3 mL Intravenous Q12H Martinique, Peter M, MD        PAST MEDICAL HISTORY: Past Medical History:  Diagnosis Date   AAA (abdominal aortic aneurysm) ([place])    a. s/p stent grafting in 2009.   Adenomatous colon polyp 05/2001   Adenomatous duodenal polyp    Allergy    Anginal pain (HCC)    COPD (chronic obstructive pulmonary disease) ([place])    pt denies COPD   Coronary artery disease    a. s/p CABG in 1995;  b. 05/2013 Neg MV, EF 82%;  c. 06/2013 Cath: LM nl, LAD 40-50p, LCX nl, RCA 80p/158m, VG->RCA->PDA->PLSA min irregs, LIMA->LAD atretic, vigorous LV fxn.   Diabetes mellitus without complication (HCC)    history of, resolved. 2017   Diverticulosis    Eczema, dyshidrotic    Eczema, dyshidrotic 1986   Dr. Mathis Fare   Emphysema of lung [facility])    Fatty liver    Fibromyalgia    GERD (gastroesophageal reflux disease)    Hyperlipidemia    Hypertension    Migraine    Myocardial infarction [facility])    Osteoarthritis    Osteoporosis    unsure, having a bone scan soon   Peripheral arterial disease ([place])    left leg diagnosed by Dr Mare Ferrari   Renal artery stenosis [facility])    a. 2008 s/p PTA    PAST SURGICAL HISTORY: Past Surgical History:  Procedure Laterality Date   ABDOMINAL AORTAGRAM N/A 03/06/2014   Procedure: ABDOMINAL AORTAGRAM;  Surgeon: Rosetta Posner, MD;  Location: [facility] CATH LAB;  Service: Cardiovascular;   Laterality: N/A;   ABDOMINAL AORTIC ANEURYSM REPAIR  2009   stenting   ABDOMINAL HYSTERECTOMY     Total, 1992   BREAST BIOPSY     CARDIAC CATHETERIZATION     CARDIOVASCULAR STRESS TEST  11/11/2009   normal study   CARPAL TUNNEL RELEASE  08/1993   left wrist   CARPAL TUNNEL RELEASE  01/1997   right   CATARACT EXTRACTION, BILATERAL  2021   CORONARY ARTERY BYPASS GRAFT  07/1994   Triple by Dr Ceasar Mons   CORONARY STENT INTERVENTION N/A 02/02/2021   Procedure: CORONARY STENT INTERVENTION;  Surgeon: Nelva Bush, MD;  Location: [place] CV LAB;  Service: Cardiovascular;  Laterality: N/A;   CORONARY STENT INTERVENTION N/A 06/26/2021   Procedure: CORONARY STENT INTERVENTION;  Surgeon: Sherren Mocha, MD;  Location: [place] CV LAB;  Service: Cardiovascular;  Laterality: N/A;   dental implants     EYE SURGERY  03/2020   cataract   HAND SURGERY Right 09/2002   Right hand pulley release   INTRAVASCULAR ULTRASOUND/IVUS N/A 06/26/2021   Procedure: Intravascular Ultrasound/IVUS;  Surgeon: Sherren Mocha, MD;  Location: [place] CV LAB;  Service: Cardiovascular;  Laterality: N/A;   LEFT HEART CATH AND CORONARY ANGIOGRAPHY N/A 06/26/2021   Procedure: LEFT HEART CATH AND CORONARY ANGIOGRAPHY;  Surgeon: Sherren Mocha, MD;  Location: [place] CV LAB;  Service: Cardiovascular;  Laterality: N/A;   LEFT HEART CATH AND CORS/GRAFTS ANGIOGRAPHY N/A 02/02/2021   Procedure: LEFT HEART CATH AND CORS/GRAFTS ANGIOGRAPHY;  Surgeon: Nelva Bush, MD;  Location: [place] CV LAB;  Service: Cardiovascular;  Laterality: N/A;   LEFT  HEART CATHETERIZATION WITH CORONARY/GRAFT ANGIOGRAM N/A 06/11/2013   Procedure: LEFT HEART CATHETERIZATION WITH [name];  Surgeon: Thayer Headings, MD;  Location[facility] C[facility] LAB;  Service: Cardiovascular;  Laterality: N/A;   MULTIPLE TOOTH EXTRACTIONS  2000   All upper teeth.  Has full dneture.    RENAL ARTERY STENT  2008   SHOULDER ARTHROSCOPY WITH  ROTATOR CUFF REPAIR Right 09/27/2018   Procedure: Right shoulder mini open rotator cuff repair;  Surgeon: Susa Day, MD;  Location: [facility] ORS;  Service: Orthopedics;  Laterality: Right;  90 mins   thyroid cyst apiration  05/1997 and 07/1998   TONSILECTOMY, ADENOIDECTOMY, BILATERAL MYRINGOTOMY AND TUBES  1989   TONSILLECTOMY     TOTAL ABDOMINAL HYSTERECTOMY  1992    FAMILY HISTORY: Family History  Problem Relation Age of Onset   Heart disease Mother    Heart attack Mother    Heart disease Father        before age 62   Heart attack Father    Heart disease Sister        before age 74   Diabetes Sister    Hyperlipidemia Sister    Hypertension Sister    Heart attack Sister    Liver cancer Sister    Breast cancer Sister    Heart disease Brother    Diabetes Brother    Hyperlipidemia Brother    Hypertension Brother    Heart attack Brother    AAA (abdominal aortic aneurysm) Brother    Colon cancer Paternal Grandmother    Heart disease Brother    Colon cancer Cousin        first cousin on father's side   Pancreatic cancer Neg Hx    Rectal cancer Neg Hx    Stomach cancer Neg Hx    Esophageal cancer Neg Hx     SOCIAL HISTORY: Social History   Socioeconomic History   Marital status: Married    Spouse name: Not on file   Number of children: Not on file   Years of education: Not on file   Highest education level: Not on file  Occupational History   Not on file  Tobacco Use   Smoking status: Former    Packs/day: 0.00    Years: 37.00    Pack years: 0.00    Types: Cigarettes    Quit date: 12/07/1995    Years since quitting: 25.7   Smokeless tobacco: Never  Vaping Use   Vaping Use: Never used  Substance and Sexual Activity   Alcohol use: Yes    Alcohol/week: 6.0 - 10.0 standard drinks    Types: 6 - 10 Glasses of wine per week    Comment: 2-3 glasses daily   Drug use: No   Sexual activity: Not Currently    Partners: Male  Other Topics Concern   Not on file  Social  History Narrative   Lives at home with husband.  Is retired.  Education 4 yrs of college.  No children.   Caffiene 2 cups daily.    Social Determinants of Health   Financial Resource Strain: Not on file  Food Insecurity: Not on file  Transportation Needs: Not on file  Physical Activity: Not on file  Stress: Not on file  Social Connections: Not on file  Intimate Partner Violence: Not on file      PHYSICAL EXAM  Vitals:   09/02/21 1349  BP: 122/69  Pulse: 76  Weight: 127 lb (57.6 kg)  Height: 5' (1.524 m)  Body mass index is 24.8 kg/m.  Generalized: Well developed, in no acute distress  Cardiology: normal rate and rhythm, no murmur noted Neurological examination  Mentation: Alert oriented to time, place, history taking. Follows all commands speech and language fluent Cranial nerve II-XII: Pupils were equal round reactive to light. Extraocular movements were full, visual field were full on confrontational test. Facial sensation and strength were normal. Uvula tongue midline. Head turning and shoulder shrug  were normal and symmetric. Motor: The motor testing reveals 5 over 5 strength of all 4 extremities with exception of 4+/5 lower extremity flexion. Good symmetric motor tone is noted throughout.  Sensory: Sensory testing is intact to soft touch on all 4 extremities. No evidence of extinction is noted.  Coordination: Cerebellar testing reveals good finger-nose-finger and heel-to-shin bilaterally.  Gait and station: Gait is slightly wide, decreased arm swing, holds cane in hand but not using today. Tandem unsteady. Romberg negative.  DTR: decreased but symmetric in bilateral lowers     DIAGNOSTIC DATA (LABS, IMAGING, TESTING) - I reviewed patient records, labs, notes, testing and imaging myself where available.  MMSE - Mini Mental State Exam 01/25/2019  Orientation to time 5  Orientation to Place 5  Registration 3  Attention/ Calculation 5  Recall 0  Language- name 2  objects 2  Language- repeat 1  Language- follow 3 step command 3  Language- read & follow direction 1  Write a sentence 1  Copy design 1  Total score 27     Lab Results  Component Value Date   WBC 9.2 08/31/2021   HGB 15.1 08/31/2021   HCT 46.4 08/31/2021   MCV 95 08/31/2021   PLT 235 08/31/2021      Component Value Date/Time   NA 142 08/31/2021 1127   K 4.2 08/31/2021 1127   CL 100 08/31/2021 1127   CO2 25 08/31/2021 1127   GLUCOSE 97 08/31/2021 1127   GLUCOSE 116 (H) 08/27/2021 1855   BUN 8 08/31/2021 1127   CREATININE 0.60 08/31/2021 1127   CREATININE 0.71 06/29/2016 1057   CALCIUM 10.0 08/31/2021 1127   PROT 5.7 (L) 04/20/2021 0845   ALBUMIN 4.0 04/20/2021 0845   AST 30 04/20/2021 0845   ALT 27 04/20/2021 0845   ALKPHOS 77 04/20/2021 0845   BILITOT 0.3 04/20/2021 0845   GFRNONAA >60 08/27/2021 1855   GFRAA 89 01/01/2020 0835   Lab Results  Component Value Date   CHOL 142 06/25/2021   HDL 49 06/25/2021   LDLCALC 73 06/25/2021   LDLDIRECT 82.0 06/13/2020   TRIG 100 06/25/2021   CHOLHDL 2.9 06/25/2021   Lab Results  Component Value Date   HGBA1C 5.7 (H) 04/20/2021   Lab Results  Component Value Date   VITAMINB12 1,262 (H) 01/09/2021   Lab Results  Component Value Date   TSH 0.714 06/27/2021     ASSESSMENT AND PLAN 75 y.o. year old female  has a past medical history of AAA (abdominal aortic aneurysm) ([place]), Adenomatous colon polyp (05/2001), Adenomatous duodenal polyp, Allergy, Anginal pain (HCC), COPD (chronic obstructive pulmonary disease) ([place]), Coronary artery disease, Diabetes mellitus without complication ([place]), Diverticulosis, Eczema, dyshidrotic, Eczema, dyshidrotic (1986), Emphysema of lung ([place]), Fatty liver, Fibromyalgia, GERD (gastroesophageal reflux disease), Hyperlipidemia, Hypertension, Migraine, Myocardial infarction ([place]), Osteoarthritis, Osteoporosis, Peripheral arterial disease ([place]), and Renal artery stenosis ([place]). here with      ICD-10-CM   1. Migraine with aura and without status migrainosus, not intractable  G43.109     2. Chest pain,  unspecified type  R07.9     3. Coronary artery disease involving coronary bypass graft of native heart with angina pectoris ([place])  I25.709        Overall Ms. Larocque is doing very well.  She does note significant improvement in headaches with gabapentin. We will continue 300mg  2-3 times daily. She will continue close follow up with care team. Fall precautions advised. She will return for formal consult with Dr Leta Baptist regarding concerns of balance difficulty in November. Continue close follow up with cardiology.  Adequate hydration, healthy diet and regular exercise advised.  She will follow-up with me as needed if PCP willing to refill gabapentin, annually if not.  She verbalizes understanding agreement this plan.   No orders of the defined types were placed in this encounter.    Meds ordered this encounter  Medications   gabapentin (NEURONTIN) 300 MG capsule    Sig: Take 1 capsule (300 mg total) by mouth 3 (three) times daily.    Dispense:  270 capsule    Refill:  3        Chrisy Hillebrand, FNP-C 09/02/2021, 2:59 PM [facility] [address] [place], [place] [address] [phone]

## 2021-09-03 ENCOUNTER — Other Ambulatory Visit: Payer: Self-pay

## 2021-09-03 ENCOUNTER — Ambulatory Visit (HOSPITAL_COMMUNITY)
Admission: RE | Admit: 2021-09-03 | Discharge: 2021-09-04 | Disposition: A | Discharge status: Home / Self Care | Payer: Medicare Other | Source: Ambulatory Visit | Attending: Internal Medicine | Admitting: Internal Medicine

## 2021-09-03 ENCOUNTER — Encounter (HOSPITAL_COMMUNITY)
Admission: RE | Disposition: A | Discharge status: Home / Self Care | Payer: Self-pay | Source: Ambulatory Visit | Attending: Internal Medicine

## 2021-09-03 DIAGNOSIS — Z888 Allergy status to other drugs, medicaments and biological substances status: Secondary | ICD-10-CM | POA: Diagnosis not present

## 2021-09-03 DIAGNOSIS — Z7982 Long term (current) use of aspirin: Secondary | ICD-10-CM | POA: Diagnosis not present

## 2021-09-03 DIAGNOSIS — E785 Hyperlipidemia, unspecified: Secondary | ICD-10-CM | POA: Diagnosis not present

## 2021-09-03 DIAGNOSIS — I739 Peripheral vascular disease, unspecified: Secondary | ICD-10-CM | POA: Insufficient documentation

## 2021-09-03 DIAGNOSIS — Z951 Presence of aortocoronary bypass graft: Secondary | ICD-10-CM | POA: Insufficient documentation

## 2021-09-03 DIAGNOSIS — Z885 Allergy status to narcotic agent status: Secondary | ICD-10-CM | POA: Diagnosis not present

## 2021-09-03 DIAGNOSIS — Z79899 Other long term (current) drug therapy: Secondary | ICD-10-CM | POA: Diagnosis not present

## 2021-09-03 DIAGNOSIS — I2510 Atherosclerotic heart disease of native coronary artery without angina pectoris: Secondary | ICD-10-CM | POA: Diagnosis not present

## 2021-09-03 DIAGNOSIS — I209 Angina pectoris, unspecified: Secondary | ICD-10-CM | POA: Diagnosis present

## 2021-09-03 DIAGNOSIS — Z87891 Personal history of nicotine dependence: Secondary | ICD-10-CM | POA: Insufficient documentation

## 2021-09-03 DIAGNOSIS — I119 Hypertensive heart disease without heart failure: Secondary | ICD-10-CM | POA: Insufficient documentation

## 2021-09-03 DIAGNOSIS — Z8249 Family history of ischemic heart disease and other diseases of the circulatory system: Secondary | ICD-10-CM | POA: Insufficient documentation

## 2021-09-03 DIAGNOSIS — R079 Chest pain, unspecified: Secondary | ICD-10-CM | POA: Diagnosis not present

## 2021-09-03 DIAGNOSIS — E7800 Pure hypercholesterolemia, unspecified: Secondary | ICD-10-CM | POA: Diagnosis not present

## 2021-09-03 DIAGNOSIS — I200 Unstable angina: Secondary | ICD-10-CM

## 2021-09-03 DIAGNOSIS — I2582 Chronic total occlusion of coronary artery: Secondary | ICD-10-CM | POA: Diagnosis not present

## 2021-09-03 DIAGNOSIS — Z881 Allergy status to other antibiotic agents status: Secondary | ICD-10-CM | POA: Insufficient documentation

## 2021-09-03 HISTORY — PX: CORONARY ULTRASOUND/IVUS: CATH118244

## 2021-09-03 HISTORY — PX: LEFT HEART CATH AND CORS/GRAFTS ANGIOGRAPHY: CATH118250

## 2021-09-03 LAB — POCT ACTIVATED CLOTTING TIME: Activated Clotting Time: 318 seconds

## 2021-09-03 LAB — GLUCOSE, CAPILLARY: Glucose-Capillary: 91 mg/dL (ref 70–99)

## 2021-09-03 SURGERY — LEFT HEART CATH AND CORS/GRAFTS ANGIOGRAPHY
Anesthesia: LOCAL

## 2021-09-03 MED ORDER — ACETAMINOPHEN 325 MG PO TABS: 650.0000 mg | ORAL_TABLET | ORAL | Status: DC | PRN

## 2021-09-03 MED ORDER — GABAPENTIN 300 MG PO CAPS
300.0000 mg | ORAL_CAPSULE | Freq: Three times a day (TID) | ORAL | Status: DC
  Administered 2021-09-03 – 2021-09-04 (×2): 300 mg via ORAL
  Filled 2021-09-03 (×2): qty 1

## 2021-09-03 MED ORDER — SODIUM CHLORIDE 0.9% FLUSH
3.0000 mL | Freq: Two times a day (BID) | INTRAVENOUS | Status: DC
  Administered 2021-09-03 – 2021-09-04 (×2): 3 mL via INTRAVENOUS

## 2021-09-03 MED ORDER — LIDOCAINE HCL (PF) 1 % IJ SOLN
INTRAMUSCULAR | Status: AC
  Filled 2021-09-03: qty 30

## 2021-09-03 MED ORDER — ISOSORBIDE MONONITRATE ER 60 MG PO TB24
90.0000 mg | ORAL_TABLET | Freq: Every day | ORAL | Status: DC
  Administered 2021-09-04: 90 mg via ORAL
  Filled 2021-09-03: qty 1

## 2021-09-03 MED ORDER — OXYCODONE HCL 5 MG PO TABS
5.0000 mg | ORAL_TABLET | Freq: Two times a day (BID) | ORAL | Status: DC
  Administered 2021-09-04: 5 mg via ORAL
  Filled 2021-09-03: qty 1

## 2021-09-03 MED ORDER — IOHEXOL 350 MG/ML SOLN
INTRAVENOUS | Status: DC | PRN
  Administered 2021-09-03: 100 mL

## 2021-09-03 MED ORDER — OXYCODONE-ACETAMINOPHEN 5-325 MG PO TABS
1.0000 | ORAL_TABLET | Freq: Two times a day (BID) | ORAL | Status: DC
  Administered 2021-09-04: 1 via ORAL
  Filled 2021-09-03: qty 1

## 2021-09-03 MED ORDER — SALINE SPRAY 0.65 % NA SOLN
1.0000 | NASAL | Status: DC | PRN
  Filled 2021-09-03: qty 44

## 2021-09-03 MED ORDER — OXYCODONE-ACETAMINOPHEN 10-325 MG PO TABS: 1.0000 | ORAL_TABLET | ORAL | Status: DC

## 2021-09-03 MED ORDER — HYDROCHLOROTHIAZIDE 25 MG PO TABS
12.5000 mg | ORAL_TABLET | Freq: Every day | ORAL | Status: DC
  Administered 2021-09-04: 12.5 mg via ORAL
  Filled 2021-09-03 (×2): qty 1

## 2021-09-03 MED ORDER — FLURANDRENOLIDE 4 MCG/SQCM EX TAPE: 1.0000 | MEDICATED_TAPE | Freq: Every day | CUTANEOUS | Status: DC | PRN

## 2021-09-03 MED ORDER — ATORVASTATIN CALCIUM 80 MG PO TABS
80.0000 mg | ORAL_TABLET | Freq: Every day | ORAL | Status: DC
  Administered 2021-09-03 – 2021-09-04 (×2): 80 mg via ORAL
  Filled 2021-09-03 (×2): qty 1

## 2021-09-03 MED ORDER — CYCLOSPORINE 0.05 % OP EMUL
1.0000 [drp] | Freq: Two times a day (BID) | OPHTHALMIC | Status: DC
  Administered 2021-09-04: 1 [drp] via OPHTHALMIC
  Filled 2021-09-03 (×2): qty 30

## 2021-09-03 MED ORDER — SODIUM CHLORIDE 0.9 % IV SOLN
INTRAVENOUS | Status: DC | PRN
  Administered 2021-09-03: 1.75 mg/kg/h via INTRAVENOUS

## 2021-09-03 MED ORDER — VITAMIN D 25 MCG (1000 UNIT) PO TABS
1000.0000 [IU] | ORAL_TABLET | Freq: Every evening | ORAL | Status: DC
  Administered 2021-09-03: 1000 [IU] via ORAL
  Filled 2021-09-03: qty 1

## 2021-09-03 MED ORDER — LORATADINE 10 MG PO TABS: 10.0000 mg | ORAL_TABLET | Freq: Every day | ORAL | Status: DC | PRN

## 2021-09-03 MED ORDER — OXYCODONE-ACETAMINOPHEN 5-325 MG PO TABS: 1.0000 | ORAL_TABLET | Freq: Two times a day (BID) | ORAL | Status: DC

## 2021-09-03 MED ORDER — METOPROLOL TARTRATE 25 MG PO TABS
37.5000 mg | ORAL_TABLET | Freq: Two times a day (BID) | ORAL | Status: DC
  Administered 2021-09-03 – 2021-09-04 (×2): 37.5 mg via ORAL
  Filled 2021-09-03 (×2): qty 1

## 2021-09-03 MED ORDER — SODIUM CHLORIDE 0.9 % WEIGHT BASED INFUSION
3.0000 mL/kg/h | INTRAVENOUS | Status: DC
  Administered 2021-09-03: 3 mL/kg/h via INTRAVENOUS

## 2021-09-03 MED ORDER — LIDOCAINE HCL (PF) 1 % IJ SOLN
INTRAMUSCULAR | Status: DC | PRN
  Administered 2021-09-03: 15 mL

## 2021-09-03 MED ORDER — ADULT MULTIVITAMIN W/MINERALS CH
1.0000 | ORAL_TABLET | Freq: Every evening | ORAL | Status: DC
  Administered 2021-09-03: 1 via ORAL
  Filled 2021-09-03: qty 1

## 2021-09-03 MED ORDER — OXYCODONE HCL 5 MG PO TABS: 5.0000 mg | ORAL_TABLET | Freq: Two times a day (BID) | ORAL | Status: DC

## 2021-09-03 MED ORDER — ASPIRIN 81 MG PO CHEW: 81.0000 mg | CHEWABLE_TABLET | ORAL | Status: DC

## 2021-09-03 MED ORDER — MIDAZOLAM HCL 2 MG/2ML IJ SOLN
INTRAMUSCULAR | Status: AC
  Filled 2021-09-03: qty 2

## 2021-09-03 MED ORDER — NITROGLYCERIN 0.4 MG SL SUBL: 0.4000 mg | SUBLINGUAL_TABLET | SUBLINGUAL | Status: DC | PRN

## 2021-09-03 MED ORDER — SODIUM CHLORIDE 0.9 % IV SOLN: 250.0000 mL | INTRAVENOUS | Status: DC | PRN

## 2021-09-03 MED ORDER — ASPIRIN EC 81 MG PO TBEC
81.0000 mg | DELAYED_RELEASE_TABLET | Freq: Every day | ORAL | Status: DC
  Administered 2021-09-04: 81 mg via ORAL
  Filled 2021-09-03: qty 1

## 2021-09-03 MED ORDER — OXYCODONE HCL 5 MG PO TABS
5.0000 mg | ORAL_TABLET | Freq: Every day | ORAL | Status: DC | PRN
  Administered 2021-09-03: 5 mg via ORAL
  Filled 2021-09-03: qty 1

## 2021-09-03 MED ORDER — OXYCODONE-ACETAMINOPHEN 5-325 MG PO TABS
1.0000 | ORAL_TABLET | Freq: Every day | ORAL | Status: DC | PRN
  Administered 2021-09-03: 1 via ORAL
  Filled 2021-09-03: qty 1

## 2021-09-03 MED ORDER — TRETINOIN 0.1 % EX CREA: 1.0000 "application " | TOPICAL_CREAM | CUTANEOUS | Status: DC

## 2021-09-03 MED ORDER — SODIUM CHLORIDE 0.9% FLUSH: 3.0000 mL | INTRAVENOUS | Status: DC | PRN

## 2021-09-03 MED ORDER — FENTANYL CITRATE (PF) 100 MCG/2ML IJ SOLN
INTRAMUSCULAR | Status: AC
  Filled 2021-09-03: qty 2

## 2021-09-03 MED ORDER — MIDAZOLAM HCL 2 MG/2ML IJ SOLN
INTRAMUSCULAR | Status: DC | PRN
  Administered 2021-09-03 (×3): 1 mg via INTRAVENOUS

## 2021-09-03 MED ORDER — ALPRAZOLAM 0.25 MG PO TABS: 0.2500 mg | ORAL_TABLET | Freq: Every evening | ORAL | Status: DC | PRN

## 2021-09-03 MED ORDER — BIVALIRUDIN TRIFLUOROACETATE 250 MG IV SOLR
INTRAVENOUS | Status: AC
  Filled 2021-09-03: qty 250

## 2021-09-03 MED ORDER — PRASUGREL HCL 5 MG PO TABS: 5.0000 mg | ORAL_TABLET | Freq: Every day | ORAL | Status: DC

## 2021-09-03 MED ORDER — PRASUGREL HCL 5 MG PO TABS
5.0000 mg | ORAL_TABLET | Freq: Every day | ORAL | Status: DC
  Administered 2021-09-04: 5 mg via ORAL
  Filled 2021-09-03: qty 1

## 2021-09-03 MED ORDER — SODIUM CHLORIDE 0.9 % WEIGHT BASED INFUSION: 1.0000 mL/kg/h | INTRAVENOUS | Status: DC

## 2021-09-03 MED ORDER — BIVALIRUDIN BOLUS VIA INFUSION - CUPID
INTRAVENOUS | Status: DC | PRN
  Administered 2021-09-03: 42.525 mg via INTRAVENOUS

## 2021-09-03 MED ORDER — B-100 COMPLEX PO TABS: ORAL_TABLET | Freq: Every evening | ORAL | Status: DC

## 2021-09-03 MED ORDER — FENTANYL CITRATE (PF) 100 MCG/2ML IJ SOLN
INTRAMUSCULAR | Status: DC | PRN
  Administered 2021-09-03 (×2): 25 ug via INTRAVENOUS
  Administered 2021-09-03: 50 ug via INTRAVENOUS

## 2021-09-03 MED ORDER — PANTOPRAZOLE SODIUM 40 MG PO TBEC
40.0000 mg | DELAYED_RELEASE_TABLET | Freq: Every day | ORAL | Status: DC
  Administered 2021-09-04: 40 mg via ORAL
  Filled 2021-09-03: qty 1

## 2021-09-03 MED ORDER — PSEUDOEPHEDRINE HCL ER 120 MG PO TB12
120.0000 mg | ORAL_TABLET | Freq: Two times a day (BID) | ORAL | Status: DC | PRN
  Filled 2021-09-03: qty 1

## 2021-09-03 MED ORDER — B COMPLEX-C PO TABS
1.0000 | ORAL_TABLET | Freq: Every day | ORAL | Status: DC
  Administered 2021-09-03: 1 via ORAL
  Filled 2021-09-03: qty 1

## 2021-09-03 MED ORDER — HYDRALAZINE HCL 20 MG/ML IJ SOLN: 10.0000 mg | INTRAMUSCULAR | Status: AC | PRN

## 2021-09-03 MED ORDER — LABETALOL HCL 5 MG/ML IV SOLN
10.0000 mg | INTRAVENOUS | Status: AC | PRN
Start: 2021-09-03 — End: 2021-09-04

## 2021-09-03 MED ORDER — HEPARIN (PORCINE) IN NACL 1000-0.9 UT/500ML-% IV SOLN
INTRAVENOUS | Status: AC
  Filled 2021-09-03: qty 1000

## 2021-09-03 MED ORDER — SODIUM CHLORIDE 0.9% FLUSH
3.0000 mL | Freq: Two times a day (BID) | INTRAVENOUS | Status: DC
  Administered 2021-09-04: 3 mL via INTRAVENOUS

## 2021-09-03 MED ORDER — LOSARTAN POTASSIUM 50 MG PO TABS
50.0000 mg | ORAL_TABLET | Freq: Every day | ORAL | Status: DC
  Administered 2021-09-04: 50 mg via ORAL
  Filled 2021-09-03: qty 1

## 2021-09-03 MED ORDER — ONDANSETRON HCL 4 MG/2ML IJ SOLN: 4.0000 mg | Freq: Four times a day (QID) | INTRAMUSCULAR | Status: DC | PRN

## 2021-09-03 MED ORDER — HEPARIN (PORCINE) IN NACL 1000-0.9 UT/500ML-% IV SOLN
INTRAVENOUS | Status: DC | PRN
  Administered 2021-09-03 (×2): 500 mL

## 2021-09-03 MED ORDER — HEPARIN SODIUM (PORCINE) 1000 UNIT/ML IJ SOLN
INTRAMUSCULAR | Status: AC
  Filled 2021-09-03: qty 1

## 2021-09-03 MED ORDER — SODIUM CHLORIDE 0.9 % IV SOLN: INTRAVENOUS | Status: AC

## 2021-09-03 SURGICAL SUPPLY — 20 items
CATH EXPO 5F MPA-1 (CATHETERS) ×2 IMPLANT
CATH INFINITI 5 FR IM (CATHETERS) ×2 IMPLANT
CATH INFINITI 5FR MULTPACK ANG (CATHETERS) ×2 IMPLANT
CATH OPTICROSS HD (CATHETERS) ×2 IMPLANT
CATH VISTA GUIDE 6FR MPA1 (CATHETERS) ×2 IMPLANT
ELECT DEFIB PAD ADLT CADENCE (PAD) ×2 IMPLANT
KIT ESSENTIALS PG (KITS) ×2 IMPLANT
KIT HEART LEFT (KITS) ×2 IMPLANT
KIT MICROPUNCTURE NIT STIFF (SHEATH) ×2 IMPLANT
PACK CARDIAC CATHETERIZATION (CUSTOM PROCEDURE TRAY) ×2 IMPLANT
SHEATH PINNACLE 5F 10CM (SHEATH) ×2 IMPLANT
SHEATH PINNACLE 6F 10CM (SHEATH) ×2 IMPLANT
SHEATH PROBE COVER 6X72 (BAG) ×2 IMPLANT
SLED PULL BACK IVUS (MISCELLANEOUS) ×2 IMPLANT
TRANSDUCER W/STOPCOCK (MISCELLANEOUS) ×2 IMPLANT
TUBING CIL FLEX 10 FLL-RA (TUBING) ×2 IMPLANT
WIRE ASAHI PROWATER 180CM (WIRE) ×2 IMPLANT
WIRE EMERALD 3MM-J .035X260CM (WIRE) ×2 IMPLANT
WIRE EMERALD ST .035X260CM (WIRE) ×2 IMPLANT
WIRE J 3MM .035X145CM (WIRE) ×2 IMPLANT

## 2021-09-03 NOTE — Progress Notes (Signed)
Site area: rt groin arterial site Site Prior to Removal:  Level 0 Pressure Applied For: 20 minutes Manual:   yes Patient Status During Pull:  stable Post Pull Site:  Level 0 Post Pull Instructions Given:  yes Post Pull Pulses Present: rt dp palpable Dressing Applied:  gauze and tegaderm Bedrest begins @ 1750 Comments:

## 2021-09-03 NOTE — Interval H&P Note (Signed)
History and Physical Interval Note:  09/03/2021 4:11 PM  Megan [name]  has presented today for surgery, with the diagnosis of chest pain.  The various methods of treatment have been discussed with the patient and family. After consideration of risks, benefits and other options for treatment, the patient has consented to  Procedure(s): LEFT HEART CATH AND CORS/GRAFTS ANGIOGRAPHY (N/A) Intravascular Ultrasound/IVUS (N/A) as a surgical intervention.  The patient's history has been reviewed, patient examined, no change in status, stable for surgery.  I have reviewed the patient's chart and labs.  Questions were answered to the patient's satisfaction.     Collier Salina Martinique

## 2021-09-04 ENCOUNTER — Encounter (HOSPITAL_COMMUNITY): Payer: Self-pay | Admitting: Internal Medicine

## 2021-09-04 DIAGNOSIS — R079 Chest pain, unspecified: Secondary | ICD-10-CM | POA: Diagnosis not present

## 2021-09-04 DIAGNOSIS — Z951 Presence of aortocoronary bypass graft: Secondary | ICD-10-CM | POA: Diagnosis not present

## 2021-09-04 DIAGNOSIS — E785 Hyperlipidemia, unspecified: Secondary | ICD-10-CM

## 2021-09-04 DIAGNOSIS — Z79899 Other long term (current) drug therapy: Secondary | ICD-10-CM | POA: Diagnosis not present

## 2021-09-04 DIAGNOSIS — Z7982 Long term (current) use of aspirin: Secondary | ICD-10-CM | POA: Diagnosis not present

## 2021-09-04 DIAGNOSIS — Z8249 Family history of ischemic heart disease and other diseases of the circulatory system: Secondary | ICD-10-CM | POA: Diagnosis not present

## 2021-09-04 DIAGNOSIS — I25119 Atherosclerotic heart disease of native coronary artery with unspecified angina pectoris: Secondary | ICD-10-CM

## 2021-09-04 DIAGNOSIS — I119 Hypertensive heart disease without heart failure: Secondary | ICD-10-CM | POA: Diagnosis not present

## 2021-09-04 DIAGNOSIS — E7800 Pure hypercholesterolemia, unspecified: Secondary | ICD-10-CM | POA: Diagnosis not present

## 2021-09-04 DIAGNOSIS — Z881 Allergy status to other antibiotic agents status: Secondary | ICD-10-CM | POA: Diagnosis not present

## 2021-09-04 DIAGNOSIS — I2510 Atherosclerotic heart disease of native coronary artery without angina pectoris: Secondary | ICD-10-CM | POA: Diagnosis not present

## 2021-09-04 DIAGNOSIS — Z888 Allergy status to other drugs, medicaments and biological substances status: Secondary | ICD-10-CM | POA: Diagnosis not present

## 2021-09-04 DIAGNOSIS — I2582 Chronic total occlusion of coronary artery: Secondary | ICD-10-CM | POA: Diagnosis not present

## 2021-09-04 DIAGNOSIS — I739 Peripheral vascular disease, unspecified: Secondary | ICD-10-CM | POA: Diagnosis not present

## 2021-09-04 DIAGNOSIS — Z885 Allergy status to narcotic agent status: Secondary | ICD-10-CM | POA: Diagnosis not present

## 2021-09-04 DIAGNOSIS — Z87891 Personal history of nicotine dependence: Secondary | ICD-10-CM | POA: Diagnosis not present

## 2021-09-04 LAB — CBC
HCT: 39.9 % (ref 36.0–46.0)
Hemoglobin: 13.3 g/dL (ref 12.0–15.0)
MCH: 31.2 pg (ref 26.0–34.0)
MCHC: 33.3 g/dL (ref 30.0–36.0)
MCV: 93.7 fL (ref 80.0–100.0)
Platelets: 196 10*3/uL (ref 150–400)
RBC: 4.26 MIL/uL (ref 3.87–5.11)
RDW: 12.9 % (ref 11.5–15.5)
WBC: 8.3 10*3/uL (ref 4.0–10.5)
nRBC: 0 % (ref 0.0–0.2)

## 2021-09-04 LAB — BASIC METABOLIC PANEL
Anion gap: 6 (ref 5–15)
BUN: 7 mg/dL — ABNORMAL LOW (ref 8–23)
CO2: 28 mmol/L (ref 22–32)
Calcium: 9.3 mg/dL (ref 8.9–10.3)
Chloride: 106 mmol/L (ref 98–111)
Creatinine, Ser: 0.76 mg/dL (ref 0.44–1.00)
GFR, Estimated: >60 mL/min (ref >60)
Glucose, Bld: 105 mg/dL — ABNORMAL HIGH (ref 70–99)
Potassium: 4.4 mmol/L (ref 3.5–5.1)
Sodium: 140 mmol/L (ref 135–145)

## 2021-09-04 NOTE — Discharge Summary (Addendum)
Discharge Summary    Patient ID: Megan [name] MRN: [id number]; DOB: 1946-03-22  Admit date: 09/03/2021 Discharge date: 09/04/2021  PCP:  [name], MD   [facility] Providers Cardiologist:  [name], MD     Discharge Diagnoses    Active Problems:   Chest pain    Diagnostic Studies/Procedures    Cath: 09/03/21  Dist LM lesion is 15% stenosed.   Prox RCA lesion is 75% stenosed.   Mid RCA to Dist RCA lesion is 100% stenosed with 100% stenosed side branch in RPAV.   Origin to Prox Graft lesion before RPDA  is 20% stenosed.   Non-stenotic Mid Graft-1 lesion before RPDA was previously treated.   Non-stenotic Mid Graft-2 lesion before RPDA was previously treated.   Seq SVG- and is large.   LIMA and is small.   There is competitive flow.   LV end diastolic pressure is normal.    Patent vein graft to PDA with minimal constraint; IVUS interrogation demonstrated a minimal stent area of 5.66mm2. Patent LIMA to LAD with relatively unchanged burden of disease elsewhere. Normal LVEDP   Recommendations:  Medical management.  Diagnostic Dominance: Right  _____________   History of Present Illness     Megan [name] is a 75 y.o. 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 Martinique as an outpatient. Most recently underwent cardiac cath 7/22 with IVUS PCI/DESx1 to SVG to RCA. Did have recurrence of chest pain when seen in the office for follow up and placed on Imdur with improvement. Was seen in the ED 9/22 with recurrent chest pain and discharged with increased dose of Imdur to 90mg  daily, metoprolol 37.5mg  BID. Seen in the office on 9/26 with recurrent angina. Concerned for progressive CAD. Set up for outpatient cardiac cath.   Hospital Course    Underwent cardiac cath noted above with patent SVG-PDA with IVUS interrogation showing  minimal stent area. Patent LIMA-LAD, normal LVEDP. Recommendations for continued medical management. She was observed overnight given femoral access used for cath. No complications noted overnight.   Patient was seen by Dr. Claiborne Billings and deemed stable for discharge home. Follow up in the office has been arranged.   Did the patient have an acute coronary syndrome (MI, NSTEMI, STEMI, etc) this admission?:  No                               Did the patient have a percutaneous coronary intervention (stent / angioplasty)?:  No.       _____________  Discharge Vitals Blood pressure (!) 117/41, pulse 73, temperature (!) 97.5 F (36.4 C), temperature source Oral, resp. rate 18, height 5' (1.524 m), weight 56.7 kg, SpO2 99 %.  Filed Weights   09/03/21 0838  Weight: 56.7 kg    Labs & Radiologic Studies    CBC Recent Labs    09/04/21 0310  WBC 8.3  HGB 13.3  HCT 39.9  MCV 93.7  PLT 413   Basic Metabolic Panel Recent Labs    09/04/21 0310  NA 140  K 4.4  CL 106  CO2 28  GLUCOSE 105*  BUN 7*  CREATININE 0.76  CALCIUM 9.3   Liver Function Tests No results for input(s): AST, ALT, ALKPHOS, BILITOT, PROT, ALBUMIN in the last 72  hours. No results for input(s): LIPASE, AMYLASE in the last 72 hours. High Sensitivity Troponin:   Recent Labs  Lab 08/27/21 1855 08/27/21 1904  TROPONINIHS 5 6    BNP Invalid input(s): POCBNP D-Dimer No results for input(s): DDIMER in the last 72 hours. Hemoglobin A1C No results for input(s): HGBA1C in the last 72 hours. Fasting Lipid Panel No results for input(s): CHOL, HDL, LDLCALC, TRIG, CHOLHDL, LDLDIRECT in the last 72 hours. Thyroid Function Tests No results for input(s): TSH, T4TOTAL, T3FREE, THYROIDAB in the last 72 hours.  Invalid input(s): FREET3 _____________  DG Chest 2 View  Result Date: 08/27/2021 CLINICAL DATA:  Chest pain. EXAM: CHEST - 2 VIEW COMPARISON:  Chest x-ray 01/31/2021. FINDINGS: Patient is status post cardiac surgery.  The cardiomediastinal silhouette is within normal limits. There are mitral annular calcifications. There are atherosclerotic calcifications of the aorta. There is no focal lung consolidation, pleural effusion or pneumothorax. No acute fractures are seen. Vascular stent is identified in the abdomen. Lumbar fusion hardware is partially visualized. IMPRESSION: No active cardiopulmonary disease. Electronically Signed   By: Ronney Asters M.D.   On: 08/27/2021 18:57   CARDIAC CATHETERIZATION  Result Date: 09/03/2021   Dist LM lesion is 15% stenosed.   Prox RCA lesion is 75% stenosed.   Mid RCA to Dist RCA lesion is 100% stenosed with 100% stenosed side branch in RPAV.   Origin to Prox Graft lesion before RPDA  is 20% stenosed.   Non-stenotic Mid Graft-1 lesion before RPDA was previously treated.   Non-stenotic Mid Graft-2 lesion before RPDA was previously treated.   Seq SVG- and is large.   LIMA and is small.   There is competitive flow.   LV end diastolic pressure is normal.  Patent vein graft to PDA with minimal constraint; IVUS interrogation demonstrated a minimal stent area of 5.46mm2. Patent LIMA to LAD with relatively unchanged burden of disease elsewhere. Normal LVEDP Recommendations:  Medical management.   US THYROID  Result Date: 08/26/2021 CLINICAL DATA:  2.1 cm right thyroid nodule by chest CTA EXAM: THYROID ULTRASOUND TECHNIQUE: Ultrasound examination of the thyroid gland and adjacent soft tissues was performed. COMPARISON:  06/25/2021 FINDINGS: Parenchymal Echotexture: Markedly heterogenous Isthmus: 4 mm Right lobe: 4.6 x 2.8 x 2.8 cm Left lobe: 4.4 x 1.6 x 1.5 cm _________________________________________________________ Estimated total number of nodules >/= 1 cm: 5 Number of spongiform nodules >/=  2 cm not described below (TR1): 0 Number of mixed cystic and solid nodules >/= 1.5 cm not described below ([place]): 0 _________________________________________________________ Nodule # 1: Location: Isthmus;  right Maximum size: 1.3 cm; Other 2 dimensions: 1.2 x 0.7 cm Composition: solid/almost completely solid (2) Echogenicity: isoechoic (1) Shape: not taller-than-wide (0) Margins: ill-defined (0) Echogenic foci: peripheral calcifications (2) ACR TI-RADS total points: 5. ACR TI-RADS risk category: TR4 (4-6 points). ACR TI-RADS recommendations: *Given size (>/= 1 - 1.4 cm) and appearance, a follow-up ultrasound in 1 year should be considered based on TI-RADS criteria. _________________________________________________________ Nodule # 3: Location: Left; Inferior Maximum size: 2.5 cm; Other 2 dimensions: 2.5 x 2.1 cm Composition: solid/almost completely solid (2) Echogenicity: isoechoic (1) Shape: not taller-than-wide (0) Margins: ill-defined (0) Echogenic foci: punctate echogenic foci (3) ACR TI-RADS total points: 6. ACR TI-RADS risk category: TR4 (4-6 points). ACR TI-RADS recommendations: **Given size (>/= 1.5 cm) and appearance, fine needle aspiration of this moderately suspicious nodule should be considered based on TI-RADS criteria. _________________________________________________________ There are 3 additional left thyroid cystic and isoechoic nodules noted, all  measuring 1.3 cm or less. These would not meet criteria for any follow-up or biopsy are not fully described by TI rads criteria. Nonspecific thyroid heterogeneity. No hypervascularity. No regional adenopathy. IMPRESSION: 2.5 cm left inferior thyroid TR 4 nodule meets criteria for biopsy as above. This correlates with the CTA finding. Additional nodules noted as above. The above is in keeping with the ACR TI-RADS recommendations - J Am Coll Radiol 2017;14:587-595. Electronically Signed   By: Jerilynn Mages.  Shick M.D.   On: 08/26/2021 16:23   Disposition   Pt is being discharged home today in good condition.  Follow-up Plans & Appointments     Follow-up Information     Martinique, Peter M, MD Follow up on 09/23/2021.   Specialty: Cardiology Why: at 10am for your  follow up appt Contact information: [address] [place] [address] [phone]                Discharge Instructions     Call MD for:  difficulty breathing, headache or visual disturbances   Complete by: As directed    Call MD for:  persistant dizziness or light-headedness   Complete by: As directed    Call MD for:  redness, tenderness, or signs of infection (pain, swelling, redness, odor or green/yellow discharge around incision site)   Complete by: As directed    Diet - low sodium heart healthy   Complete by: As directed    Discharge instructions   Complete by: As directed    Groin Site Care Refer to this sheet in the next few weeks. These instructions provide you with information on caring for yourself after your procedure. Your caregiver may also give you more specific instructions. Your treatment has been planned according to current medical practices, but problems sometimes occur. Call your caregiver if you have any problems or questions after your procedure. HOME CARE INSTRUCTIONS You may shower 24 hours after the procedure. Remove the bandage (dressing) and gently wash the site with plain soap and water. Gently pat the site dry.  Do not apply powder or lotion to the site.  Do not sit in a bathtub, swimming pool, or whirlpool for 5 to 7 days.  No bending, squatting, or lifting anything over 10 pounds (4.5 kg) as directed by your caregiver.  Inspect the site at least twice daily.  Do not drive home if you are discharged the same day of the procedure. Have someone else drive you.  You may drive 24 hours after the procedure unless otherwise instructed by your caregiver.  What to expect: Any bruising will usually fade within 1 to 2 weeks.  Blood that collects in the tissue (hematoma) may be painful to the touch. It should usually decrease in size and tenderness within 1 to 2 weeks.  SEEK IMMEDIATE MEDICAL CARE IF: You have unusual pain at the groin site  or down the affected leg.  You have redness, warmth, swelling, or pain at the groin site.  You have drainage (other than a small amount of blood on the dressing).  You have chills.  You have a fever or persistent symptoms for more than 72 hours.  You have a fever and your symptoms suddenly get worse.  Your leg becomes pale, cool, tingly, or numb.  You have heavy bleeding from the site. Hold pressure on the site. .   Increase activity slowly   Complete by: As directed        Discharge Medications   Allergies as of  09/04/2021       Reactions   Brilinta [ticagrelor] Shortness Of Breath   Bextra [valdecoxib] Other (See Comments)   Increased lft's   Hydrocod Polst-cpm Polst Er Rash   Lipitor [atorvastatin] Other (See Comments)   Leg weakness   Mevacor [lovastatin] Other (See Comments)   Muscle weakness   Plavix [clopidogrel Bisulfate] Itching, Other (See Comments)   Hands and feet tingle and itch   Zocor [simvastatin] Other (See Comments)   Bones hurt   Doxycycline Diarrhea, Nausea And Vomiting   Metoprolol Other (See Comments)   Hair loss   Morphine And Related Itching, Nausea Only   Codeine Itching, Rash   Hyper-active   Lisinopril Cough        Medication List     TAKE these medications    Accu-Chek FastClix Lancets Misc Check blood sugar once daily PRN   Accu-Chek SmartView test strip Generic drug: glucose blood CHECK BLOOD SUGAR ONCE DAILY AS NEEDED   ALPRAZolam 0.25 MG tablet Commonly known as: XANAX Take 1 tablet (0.25 mg total) by mouth at bedtime as needed for sleep.   aspirin EC 81 MG tablet Take 1 tablet (81 mg total) by mouth daily.   atorvastatin 80 MG tablet Commonly known as: LIPITOR TAKE 1 TABLET BY MOUTH EVERY DAY   B-100 COMPLEX PO Take 1 tablet by mouth every evening.   bismuth subsalicylate 811 BJ/47WG suspension Commonly known as: PEPTO BISMOL Take 30 mLs by mouth every 6 (six) hours as needed.   Breztri Aerosphere 160-9-4.8  MCG/ACT Aero Generic drug: Budeson-Glycopyrrol-Formoterol Inhale 2 puffs into the lungs in the morning and at bedtime.   cetirizine 10 MG tablet Commonly known as: ZYRTEC Take 10 mg by mouth daily as needed for allergies.   cholecalciferol 25 MCG (1000 UNIT) tablet Commonly known as: VITAMIN D3 Take 1,000 Units by mouth every evening.   Cordran 4 MCG/SQCM Tape Generic drug: Flurandrenolide Apply 1 each topically daily as needed (Rash).   cycloSPORINE 0.05 % ophthalmic emulsion Commonly known as: RESTASIS Place 1 drop into both eyes 2 (two) times daily.   gabapentin 300 MG capsule Commonly known as: NEURONTIN Take 1 capsule (300 mg total) by mouth 3 (three) times daily.   GAS-X PO Take 1-2 tablets by mouth daily as needed (gas).   hydrochlorothiazide 12.5 MG tablet Commonly known as: HYDRODIURIL Take 1 tablet (12.5 mg total) by mouth daily.   isosorbide mononitrate 30 MG 24 hr tablet Commonly known as: IMDUR Take 3 tablets ( 90 mg ) every morning   losartan 50 MG tablet Commonly known as: COZAAR Take 1 tablet (50 mg total) by mouth daily.   metoprolol tartrate 25 MG tablet Commonly known as: LOPRESSOR Take 1.5 tablets (37.5 mg total) by mouth 2 (two) times daily.   multivitamin with minerals tablet Take 1 tablet by mouth every evening.   nitroGLYCERIN 0.4 MG SL tablet Commonly known as: NITROSTAT Place 1 tablet (0.4 mg total) under the tongue every 5 (five) minutes as needed for chest pain.   omeprazole 40 MG capsule Commonly known as: PRILOSEC Take 1 capsule (40 mg total) by mouth in the morning and at bedtime.   oxyCODONE-acetaminophen 10-325 MG tablet Commonly known as: PERCOCET Take 1 tablet by mouth See admin instructions. Take 1 tablet twice daily, may take a third tablet as needed for pain   prasugrel 5 MG Tabs tablet Commonly known as: EFFIENT Take 5 mg by mouth daily. What changed: Another medication with the same name  was removed. Continue taking  this medication, and follow the directions you see here.   PRESCRIPTION MEDICATION Place 2 puffs into both ears daily as needed (ear infections). CSA[facility] ear powder   sodium chloride 0.65 % Soln nasal spray Commonly known as: OCEAN Place 1 spray into both nostrils as needed for congestion.   tretinoin 0.1 % cream Commonly known as: RETIN-A Apply 1 application topically every other day. In the evening         Outstanding Labs/Studies   N/a   Duration of Discharge Encounter   Greater than 30 minutes including physician time.  Signed, Reino Bellis, NP 09/04/2021, 10:24 AM   Patient seen and examined. Agree with assessment and plan.  Patient feels well without recurrent chest pain.  I reviewed her angiographic images.  Proximal and distal stents to the SVG to RCA are widely patent as is the LIMA graft.  We will continue medical therapy.  Right groin catheterization site is stable without ecchymosis or hematoma.  Will discharge today with plans for follow-up with Dr. Martinique   Katrenia Alkins A. Dente, MD, [facility] 09/04/2021 10:41 AM

## 2021-09-04 NOTE — Plan of Care (Signed)
  Problem: Skin Integrity: Goal: Risk for impaired skin integrity will decrease Outcome: Completed/Met   Problem: Safety: Goal: Ability to remain free from injury will improve Outcome: Completed/Met   Problem: Pain Managment: Goal: General experience of comfort will improve Outcome: Completed/Met   Problem: Elimination: Goal: Will not experience complications related to urinary retention Outcome: Completed/Met   Problem: Coping: Goal: Level of anxiety will decrease Outcome: Completed/Met   Problem: Nutrition: Goal: Adequate nutrition will be maintained Outcome: Completed/Met   Problem: Activity: Goal: Risk for activity intolerance will decrease Outcome: Completed/Met   Problem: Clinical Measurements: Goal: Will remain free from infection Outcome: Completed/Met

## 2021-09-04 NOTE — Plan of Care (Signed)
  Problem: Education: Goal: Knowledge of General Education information will improve Description: Including pain rating scale, medication(s)/side effects and non-pharmacologic comfort measures Outcome: Adequate for Discharge   Problem: Health Behavior/Discharge Planning: Goal: Ability to manage health-related needs will improve Outcome: Adequate for Discharge   Problem: Clinical Measurements: Goal: Ability to maintain clinical measurements within normal limits will improve Outcome: Adequate for Discharge Goal: Diagnostic test results will improve Outcome: Adequate for Discharge Goal: Respiratory complications will improve Outcome: Adequate for Discharge Goal: Cardiovascular complication will be avoided Outcome: Adequate for Discharge   Problem: Elimination: Goal: Will not experience complications related to bowel motility Outcome: Adequate for Discharge

## 2021-09-09 ENCOUNTER — Ambulatory Visit (INDEPENDENT_AMBULATORY_CARE_PROVIDER_SITE_OTHER): Payer: Medicare Other | Admitting: Diagnostic Neuroimaging

## 2021-09-09 ENCOUNTER — Encounter: Payer: Self-pay | Admitting: Diagnostic Neuroimaging

## 2021-09-09 VITALS — BP 132/64 | HR 53 | Ht 60.0 in | Wt 130.0 lb

## 2021-09-09 DIAGNOSIS — M48061 Spinal stenosis, lumbar region without neurogenic claudication: Secondary | ICD-10-CM

## 2021-09-09 DIAGNOSIS — M4802 Spinal stenosis, cervical region: Secondary | ICD-10-CM

## 2021-09-09 NOTE — Progress Notes (Signed)
[facility]  PATIENT: Megan [name] DOB: 10/20/1946  REFERRING CLINICIAN: Eustace Moore, MD HISTORY FROM: patient  REASON FOR VISIT: new consult    HISTORICAL  CHIEF COMPLAINT:  Chief Complaint  Patient presents with   New Patient (Initial Visit)    Rm 6 here for consult on worsening gait- Pt reports she had back surgery back in June of 2021. Pt reports balance issue started after this, PT therapy has been completed but not much improvement noted. Also reports pain in her back as well.      HISTORY OF PRESENT ILLNESS:   UPDATE 09/09/21: 75 year old female here for evaluation of gait and balance difficulty.  Patient had progressive back pain for past few years.  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  She underwent multilevel decompression surgery in July 2021.  Following surgery her back pain improved but her gait worsened.  Follow-up MRI on 09/04/2020 showed worsening spinal stenosis at L2-3 level.  Patient has tried physical therapy and personal trainer without relief.  She has some intermittent gait difficulty where she feels like she loses balance.  She has not fallen.  She still able to perform her ADLs and walk up to 1 mile per day.  Patient referred here to rule out other causes of gait difficulty.   UPDATE (05/09/18, VRP): Since last visit, doing well and migraine resolved on their own. No alleviating or aggravating factors.    Now with new onset of bilateral upper lip burning sensation and increased lower lip tingling since new dentures (upper and lower) since Jan 2019. Upper lip burning worse with patient has dentures in place, and slightly reduced when dentures taken out over night. Symptoms initially improving with denture adjustments, but now symptoms are slightly worse in last 2-3 weeks.   PRIOR HPI (08/10/17): 75 year old female here for evaluation of headaches.   Patient had onset of headaches around age 24 years old, sometimes  preceded by seeing wavy lines of water. Headaches would be unilateral or bilateral, sometimes associated with unilateral hemibody numbness, sometimes a fascia, with headaches lasting up to one dated time. Patient had one or 2 headaches per year. After she had a hysterectomy in 1992 her headaches significant improved. Patient was never officially diagnosed with migraine headaches but suspected that she had migraines due to her sister's history of migraines and her paternal grandmother's history of migraines. Patient never tried prescription migraine medication.   06/15/2017 patient had cold sensation and chills. She then had headache which continued until the next day. This reminded her of her previous headaches earlier in life. Headache recurred another day later and lasted for 5 weeks continuously. She had 2 episodes of visual aura. She then had headaches from August 20 until the 30th. She had another headache from September 2 of September 5. No nausea vomiting. No photophobia or phonophobia. She has had off-balance sensation. She has had left-sided headaches ranging from 3 out of 10 in severity. She has tried Midrin and Fioricet without relief. She is tried ibuprofen and Tylenol without relief.   Initially patient cannot identify any triggering factors. However 1 week prior to onset of headaches she had a tick bite. Also starting in May 2018 through July 2018, patient had significant dental and oral surgery procedures including 5 oral surgery implants as well as a bone spur removal. She has been left with residual post surgical numbness in her right lower lip and chin.   Also in the end of  July 2018 patient had sudden onset of hearing loss in her right ear. No other associated symptoms. Patient went to ENT for evaluation. Patient was treated empirically with prednisone, acyclovir and doxycycline. Her hearing loss has significantly improved.     REVIEW OF SYSTEMS: Full 14 system review of systems  performed and negative with exception of: As per HPI.  ALLERGIES: Allergies  Allergen Reactions   Brilinta [Ticagrelor] Shortness Of Breath   Bextra [Valdecoxib] Other (See Comments)    Increased lft's    Hydrocod Polst-Cpm Polst Er Rash   Lipitor [Atorvastatin] Other (See Comments)    Leg weakness    Mevacor [Lovastatin] Other (See Comments)    Muscle weakness    Plavix [Clopidogrel Bisulfate] Itching and Other (See Comments)    Hands and feet tingle and itch   Zocor [Simvastatin] Other (See Comments)    Bones hurt    Doxycycline Diarrhea and Nausea And Vomiting   Metoprolol Other (See Comments)    Hair loss   Morphine And Related Itching and Nausea Only   Codeine Itching and Rash    Hyper-active   Lisinopril Cough    HOME MEDICATIONS: Outpatient Medications Prior to Visit  Medication Sig Dispense Refill   Accu-Chek FastClix Lancets MISC Check blood sugar once daily PRN 100 each 1   ALPRAZolam (XANAX) 0.25 MG tablet Take 1 tablet (0.25 mg total) by mouth at bedtime as needed for sleep. 90 tablet 3   aspirin EC 81 MG tablet Take 1 tablet (81 mg total) by mouth daily. 90 tablet 3   atorvastatin (LIPITOR) 80 MG tablet TAKE 1 TABLET BY MOUTH EVERY DAY 90 tablet 3   B Complex-Biotin-FA (B-100 COMPLEX PO) Take 1 tablet by mouth every evening.     bismuth subsalicylate (PEPTO BISMOL) 262 MG/15ML suspension Take 30 mLs by mouth every 6 (six) hours as needed.     Budeson-Glycopyrrol-Formoterol (BREZTRI AEROSPHERE) 160-9-4.8 MCG/ACT AERO Inhale 2 puffs into the lungs in the morning and at bedtime. 10.7 g 5   cetirizine (ZYRTEC) 10 MG tablet Take 10 mg by mouth daily as needed for allergies.     cholecalciferol (VITAMIN D3) 25 MCG (1000 UNIT) tablet Take 1,000 Units by mouth every evening.     CORDRAN 4 MCG/SQCM TAPE Apply 1 each topically daily as needed (Rash).      cycloSPORINE (RESTASIS) 0.05 % ophthalmic emulsion Place 1 drop into both eyes 2 (two) times daily.      gabapentin  (NEURONTIN) 300 MG capsule Take 1 capsule (300 mg total) by mouth 3 (three) times daily. 270 capsule 3   glucose blood (ACCU-CHEK SMARTVIEW) test strip CHECK BLOOD SUGAR ONCE DAILY AS NEEDED 100 strip 1   hydrochlorothiazide (HYDRODIURIL) 12.5 MG tablet Take 1 tablet (12.5 mg total) by mouth daily. 90 tablet 3   isosorbide mononitrate (IMDUR) 30 MG 24 hr tablet Take 3 tablets ( 90 mg ) every morning 270 tablet 3   metoprolol tartrate (LOPRESSOR) 25 MG tablet Take 1.5 tablets (37.5 mg total) by mouth 2 (two) times daily. 120 tablet 3   Multiple Vitamins-Minerals (MULTIVITAMIN WITH MINERALS) tablet Take 1 tablet by mouth every evening.     nitroGLYCERIN (NITROSTAT) 0.4 MG SL tablet Place 1 tablet (0.4 mg total) under the tongue every 5 (five) minutes as needed for chest pain. 25 tablet 11   omeprazole (PRILOSEC) 40 MG capsule Take 1 capsule (40 mg total) by mouth in the morning and at bedtime. 180 capsule 3   oxyCODONE-acetaminophen (PERCOCET)  10-325 MG tablet Take 1 tablet by mouth See admin instructions. Take 1 tablet twice daily, may take a third tablet as needed for pain     prasugrel (EFFIENT) 5 MG TABS tablet Take 5 mg by mouth daily.     PRESCRIPTION MEDICATION Place 2 puffs into both ears daily as needed (ear infections). [facility] ear powder     Simethicone (GAS-X PO) Take 1-2 tablets by mouth daily as needed (gas).     sodium chloride (OCEAN) 0.65 % SOLN nasal spray Place 1 spray into both nostrils as needed for congestion.     tretinoin (RETIN-A) 0.1 % cream Apply 1 application topically every other day. In the evening     losartan (COZAAR) 50 MG tablet Take 1 tablet (50 mg total) by mouth daily. 90 tablet 2   No facility-administered medications prior to visit.    PAST MEDICAL HISTORY: Past Medical History:  Diagnosis Date   AAA (abdominal aortic aneurysm)    a. s/p stent grafting in 2009.   Adenomatous colon polyp 05/2001   Adenomatous duodenal polyp    Allergy    Anginal pain (HCC)     COPD (chronic obstructive pulmonary disease) ([place])    pt denies COPD   Coronary artery disease    a. s/p CABG in 1995;  b. 05/2013 Neg MV, EF 82%;  c. 06/2013 Cath: LM nl, LAD 40-50p, LCX nl, RCA 80p/133m, VG->RCA->PDA->PLSA min irregs, LIMA->LAD atretic, vigorous LV fxn.   Diabetes mellitus without complication (HCC)    history of, resolved. 2017   Diverticulosis    Eczema, dyshidrotic    Eczema, dyshidrotic 1986   Dr. Mathis Fare   Emphysema of lung [facility])    Fatty liver    Fibromyalgia    GERD (gastroesophageal reflux disease)    Hyperlipidemia    Hypertension    Migraine    Myocardial infarction [facility])    Osteoarthritis    Osteoporosis    unsure, having a bone scan soon   Peripheral arterial disease ([place])    left leg diagnosed by Dr Mare Ferrari   Renal artery stenosis [facility])    a. 2008 s/p PTA    PAST SURGICAL HISTORY: Past Surgical History:  Procedure Laterality Date   ABDOMINAL AORTAGRAM N/A 03/06/2014   Procedure: ABDOMINAL AORTAGRAM;  Surgeon: Rosetta Posner, MD;  Location: [facility] CATH LAB;  Service: Cardiovascular;  Laterality: N/A;   ABDOMINAL AORTIC ANEURYSM REPAIR  2009   stenting   ABDOMINAL HYSTERECTOMY     Total, 1992   BREAST BIOPSY     CARDIAC CATHETERIZATION     CARDIOVASCULAR STRESS TEST  11/11/2009   normal study   CARPAL TUNNEL RELEASE  08/1993   left wrist   CARPAL TUNNEL RELEASE  01/1997   right   CATARACT EXTRACTION, BILATERAL  2021   CORONARY ARTERY BYPASS GRAFT  07/1994   Triple by Dr Ceasar Mons   CORONARY STENT INTERVENTION N/A 02/02/2021   Procedure: CORONARY STENT INTERVENTION;  Surgeon: Nelva Bush, MD;  Location: [place] CV LAB;  Service: Cardiovascular;  Laterality: N/A;   CORONARY STENT INTERVENTION N/A 06/26/2021   Procedure: CORONARY STENT INTERVENTION;  Surgeon: Sherren Mocha, MD;  Location: [place] CV LAB;  Service: Cardiovascular;  Laterality: N/A;   dental implants     EYE SURGERY  03/2020   cataract   HAND SURGERY Right  09/2002   Right hand pulley release   INTRAVASCULAR ULTRASOUND/IVUS N/A 06/26/2021   Procedure: Intravascular Ultrasound/IVUS;  Surgeon: Sherren Mocha,  MD;  Location: [place] CV LAB;  Service: Cardiovascular;  Laterality: N/A;   INTRAVASCULAR ULTRASOUND/IVUS N/A 09/03/2021   Procedure: Intravascular Ultrasound/IVUS;  Surgeon: Early Osmond, MD;  Location: [place] CV LAB;  Service: Cardiovascular;  Laterality: N/A;   LEFT HEART CATH AND CORONARY ANGIOGRAPHY N/A 06/26/2021   Procedure: LEFT HEART CATH AND CORONARY ANGIOGRAPHY;  Surgeon: Sherren Mocha, MD;  Location: [place] CV LAB;  Service: Cardiovascular;  Laterality: N/A;   LEFT HEART CATH AND CORS/GRAFTS ANGIOGRAPHY N/A 02/02/2021   Procedure: LEFT HEART CATH AND CORS/GRAFTS ANGIOGRAPHY;  Surgeon: Nelva Bush, MD;  Location: [place] CV LAB;  Service: Cardiovascular;  Laterality: N/A;   LEFT HEART CATH AND CORS/GRAFTS ANGIOGRAPHY N/A 09/03/2021   Procedure: LEFT HEART CATH AND CORS/GRAFTS ANGIOGRAPHY;  Surgeon: Early Osmond, MD;  Location: [place] CV LAB;  Service: Cardiovascular;  Laterality: N/A;   LEFT HEART CATHETERIZATION WITH CORONARY/GRAFT ANGIOGRAM N/A 06/11/2013   Procedure: LEFT HEART CATHETERIZATION WITH [name];  Surgeon: Thayer Headings, MD;  Location[facility] CATH LAB;  Service: Cardiovascular;  Laterality: N/A;   MULTIPLE TOOTH EXTRACTIONS  2000   All upper teeth.  Has full dneture.    RENAL ARTERY STENT  2008   SHOULDER ARTHROSCOPY WITH ROTATOR CUFF REPAIR Right 09/27/2018   Procedure: Right shoulder mini open rotator cuff repair;  Surgeon: Susa Day, MD;  Location: [facility] ORS;  Service: Orthopedics;  Laterality: Right;  90 mins   thyroid cyst apiration  05/1997 and 07/1998   TONSILECTOMY, ADENOIDECTOMY, BILATERAL MYRINGOTOMY AND TUBES  1989   TONSILLECTOMY     TOTAL ABDOMINAL HYSTERECTOMY  1992    FAMILY HISTORY: Family History  Problem Relation Age of Onset   Heart disease Mother     Heart attack Mother    Heart disease Father        before age 25   Heart attack Father    Heart disease Sister        before age 74   Diabetes Sister    Hyperlipidemia Sister    Hypertension Sister    Heart attack Sister    Liver cancer Sister    Breast cancer Sister    Heart disease Brother    Diabetes Brother    Hyperlipidemia Brother    Hypertension Brother    Heart attack Brother    AAA (abdominal aortic aneurysm) Brother    Colon cancer Paternal Grandmother    Heart disease Brother    Colon cancer Cousin        first cousin on father's side   Pancreatic cancer Neg Hx    Rectal cancer Neg Hx    Stomach cancer Neg Hx    Esophageal cancer Neg Hx     SOCIAL HISTORY: Social History   Socioeconomic History   Marital status: Married    Spouse name: Not on file   Number of children: Not on file   Years of education: Not on file   Highest education level: Not on file  Occupational History   Not on file  Tobacco Use   Smoking status: Former    Packs/day: 0.00    Years: 37.00    Pack years: 0.00    Types: Cigarettes    Quit date: 12/07/1995    Years since quitting: 25.7   Smokeless tobacco: Never  Vaping Use   Vaping Use: Never used  Substance and Sexual Activity   Alcohol use: Yes    Alcohol/week: 6.0 - 10.0 standard drinks  Types: 6 - 10 Glasses of wine per week    Comment: 2-3 glasses daily   Drug use: No   Sexual activity: Not Currently    Partners: Male  Other Topics Concern   Not on file  Social History Narrative   Lives at home with husband.  Is retired.  Education 4 yrs of college.  No children.   Caffiene 2 cups daily. Right handed    Social Determinants of Health   Financial Resource Strain: Not on file  Food Insecurity: Not on file  Transportation Needs: Not on file  Physical Activity: Not on file  Stress: Not on file  Social Connections: Not on file  Intimate Partner Violence: Not on file     PHYSICAL EXAM  GENERAL  EXAM/CONSTITUTIONAL: Vitals:  Vitals:   09/09/21 0823  BP: 132/64  Pulse: (!) 53  SpO2: 96%  Weight: 130 lb (59 kg)  Height: 5' (1.524 m)   Body mass index is 25.39 kg/m. Wt Readings from Last 3 Encounters:  09/09/21 130 lb (59 kg)  09/03/21 125 lb (56.7 kg)  09/02/21 127 lb (57.6 kg)   Patient is in no distress; well developed, nourished and groomed; neck is supple  CARDIOVASCULAR: Examination of carotid arteries is normal; no carotid bruits Regular rate and rhythm, no murmurs Examination of peripheral vascular system by observation and palpation is normal  EYES: Ophthalmoscopic exam of optic discs and posterior segments is normal; no papilledema or hemorrhages No results found.  MUSCULOSKELETAL: Gait, strength, tone, movements noted in Neurologic exam below  NEUROLOGIC: MENTAL STATUS:  MMSE - Mini Mental State Exam 01/25/2019  Orientation to time 5  Orientation to Place 5  Registration 3  Attention/ Calculation 5  Recall 0  Language- name 2 objects 2  Language- repeat 1  Language- follow 3 step command 3  Language- read & follow direction 1  Write a sentence 1  Copy design 1  Total score 27   awake, alert, oriented to person, place and time recent and remote memory intact normal attention and concentration language fluent, comprehension intact, naming intact fund of knowledge appropriate  CRANIAL NERVE:  2nd - no papilledema on fundoscopic exam 2nd, 3rd, 4th, 6th - pupils equal and reactive to light, visual fields full to confrontation, extraocular muscles intact, no nystagmus 5th - facial sensation symmetric 7th - facial strength symmetric 8th - hearing intact 9th - palate elevates symmetrically, uvula midline 11th - shoulder shrug symmetric 12th - tongue protrusion midline  MOTOR:  normal bulk and tone, full strength in the BUE, BLE; EXCEPT BILATERAL HIP FLEXOR WEAKNESS (3+/5)  SENSORY:  normal and symmetric to light touch, temperature,  vibration  COORDINATION:  finger-nose-finger, fine finger movements normal  REFLEXES:  deep tendon reflexes TRACE and symmetric  GAIT/STATION:  narrow based gait; SLOW AND CAUTIOUS     DIAGNOSTIC DATA (LABS, IMAGING, TESTING) - I reviewed patient records, labs, notes, testing and imaging myself where available.  Lab Results  Component Value Date   WBC 8.3 09/04/2021   HGB 13.3 09/04/2021   HCT 39.9 09/04/2021   MCV 93.7 09/04/2021   PLT 196 09/04/2021      Component Value Date/Time   NA 140 09/04/2021 0310   NA 142 08/31/2021 1127   K 4.4 09/04/2021 0310   CL 106 09/04/2021 0310   CO2 28 09/04/2021 0310   GLUCOSE 105 (H) 09/04/2021 0310   BUN 7 (L) 09/04/2021 0310   BUN 8 08/31/2021 1127   CREATININE 0.76  09/04/2021 0310   CREATININE 0.71 06/29/2016 1057   CALCIUM 9.3 09/04/2021 0310   PROT 5.7 (L) 04/20/2021 0845   ALBUMIN 4.0 04/20/2021 0845   AST 30 04/20/2021 0845   ALT 27 04/20/2021 0845   ALKPHOS 77 04/20/2021 0845   BILITOT 0.3 04/20/2021 0845   GFRNONAA >60 09/04/2021 0310   GFRAA 89 01/01/2020 0835   Lab Results  Component Value Date   CHOL 142 06/25/2021   HDL 49 06/25/2021   LDLCALC 73 06/25/2021   LDLDIRECT 82.0 06/13/2020   TRIG 100 06/25/2021   CHOLHDL 2.9 06/25/2021   Lab Results  Component Value Date   HGBA1C 5.7 (H) 04/20/2021   Lab Results  Component Value Date   VITAMINB12 1,262 (H) 01/09/2021   Lab Results  Component Value Date   TSH 0.714 06/27/2021     09/04/20 MRI lumbar spine [I reviewed images myself and agree with interpretation. -VRP]  - interval fusion L3-L5 - L3 compression fracture - moderate spinal stenosis at L2-3 - moderate biforaminal stenosis L2-3, L4-5  09/30/20 MRI brain - Stable compared to 2018.  No specific explanation for symptoms.  09/30/20  MRI cervical spine - Mild degenerative spinal stenosis at C5-6 without cord compression or signal abnormality to explain altered gait.  01/07/21 MRI brain [I  reviewed images myself and agree with interpretation. -VRP]  1. No acute intracranial abnormality. 2. 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    ASSESSMENT AND PLAN  75 y.o. year old female here with history of severe spinal stenosis at L4-5 status post decompression and fusion from L3-L5 levels.  Now with worsening spinal stenosis at L2-3 level.  Gait difficulty likely related to hip flexor weakness associated with L2-3 spinal stenosis.   Dx:  1. 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 Cervical spinal stenosis      PLAN:  GAIT DIFFICULTY / HIP FLEXOR WEAKNESS / LOW BACK PAIN - likely related to worsening lumbar spine stenosis at L2-3 - no other signs of brain, cervical spine or peripheral neuropathy localization - encouraged to restart personal trainer / physical therapy  UPPER AND LOWER LIP NUMBNESS (may be related to denture fitting) - gabapentin 300mg  twice a day   MIGRAINE WITH AURA (resolved) - use tylenol / ibuprofen as needed for breakthrough headache   Return for pending if symptoms worsen or fail to improve.    Penni Bombard, MD 61/05/8371, 9:02 AM Certified in Neurology, Neurophysiology and Neuroimaging  [facility] [address] [place], [place] [address] [phone]

## 2021-09-09 NOTE — Patient Instructions (Signed)
  GAIT DIFFICULTY / HIP FLEXOR WEAKNESS / LOW BACK PAIN - likely related to worsening lumbar spine stenosis at L2-3 - no other signs of brain, cervical spine or peripheral neuropathy localization - encouraged to restart personal trainer / physical therapy  UPPER AND LOWER LIP NUMBNESS (may be related to denture fitting) - gabapentin 300mg  twice a day   MIGRAINE WITH AURA (resolved) - use tylenol / ibuprofen as needed for breakthrough headache

## 2021-09-11 ENCOUNTER — Telehealth: Payer: Self-pay

## 2021-09-11 NOTE — Telephone Encounter (Signed)
Pt called wanting to know if she needs to keep her 6 month follow up on 10/12. Pt also wants to know about the biopsy for the lump on her neck. Osiris stated that she hasnt heard anything. Please Advise.

## 2021-09-11 NOTE — Telephone Encounter (Signed)
Please Advise Megan [name], can you look into her Endo referral please??

## 2021-09-14 NOTE — Telephone Encounter (Signed)
Please get patient scheduled for 3 month follow up, cancel 10/12 appointment.  Lattie Haw, please look into endo referral!

## 2021-09-14 NOTE — Telephone Encounter (Signed)
Patient was given an appointment with Endo but stated she cannot make the appointment. If patient calls, she needs to be given the number for Endo so she can call and schedule an appointment that works for her. It will most likely be next year, [phone]

## 2021-09-14 NOTE — Telephone Encounter (Signed)
Lvm advising I canceled this weeks appointment and to call and make a 3 month follow up.

## 2021-09-16 ENCOUNTER — Ambulatory Visit: Payer: Medicare Other | Admitting: Family Medicine

## 2021-09-16 DIAGNOSIS — M7062 Trochanteric bursitis, left hip: Secondary | ICD-10-CM | POA: Diagnosis not present

## 2021-09-16 DIAGNOSIS — M7061 Trochanteric bursitis, right hip: Secondary | ICD-10-CM | POA: Diagnosis not present

## 2021-09-18 ENCOUNTER — Other Ambulatory Visit: Payer: Self-pay

## 2021-09-18 ENCOUNTER — Ambulatory Visit (HOSPITAL_COMMUNITY)
Admission: RE | Admit: 2021-09-18 | Discharge: 2021-09-18 | Disposition: A | Discharge status: Home / Self Care | Payer: Medicare Other | Source: Ambulatory Visit | Attending: Vascular Surgery | Admitting: Vascular Surgery

## 2021-09-18 DIAGNOSIS — K5730 Diverticulosis of large intestine without perforation or abscess without bleeding: Secondary | ICD-10-CM | POA: Diagnosis not present

## 2021-09-18 DIAGNOSIS — I7143 Infrarenal abdominal aortic aneurysm, without rupture: Secondary | ICD-10-CM | POA: Diagnosis not present

## 2021-09-18 DIAGNOSIS — I774 Celiac artery compression syndrome: Secondary | ICD-10-CM | POA: Diagnosis not present

## 2021-09-18 DIAGNOSIS — Z95828 Presence of other vascular implants and grafts: Secondary | ICD-10-CM | POA: Diagnosis not present

## 2021-09-18 DIAGNOSIS — K8689 Other specified diseases of pancreas: Secondary | ICD-10-CM | POA: Diagnosis not present

## 2021-09-18 MED ORDER — IOHEXOL 350 MG/ML SOLN
100.0000 mL | Freq: Once | INTRAVENOUS | Status: AC | PRN
  Administered 2021-09-18: 100 mL via INTRAVENOUS

## 2021-09-18 NOTE — Progress Notes (Signed)
Cardiology Office Note   Date:  09/23/2021   ID:  Megan [name], DOB September 18, 1946, MRN [id number]  PCP:  [name], MD  Cardiologist: Whittney Steenson Martinique MD  Chief Complaint  Patient presents with   Follow-up    Post cath.   Coronary Artery Disease        History of Present Illness: Megan [name] is a 75 y.o. female who is seen for follow up CAD.  She has a history  of CAD, HTN, HL, and PAD.   She is status post coronary artery bypass graft surgery in 1995- Dr Servando Snare. She had a renal artery stent for renal artery stenosis in 2008. She had a stent graft of an abdominal aortic aneurysm 2009. She is s/p stenting of the left iliac. She is followed by [name] for PAD. Last CT in May 2018. She had [place] in June 2019 showing no endoleak.    She was hospitalized in July 2014 and had cardiac catheterization which showed that her grafts were open. EF normal.  She was hospitalized 11/15/2014 with which he describes as an aching sensation in her chest.    She underwent a treadmill Myoview on 11/17/14 which showed no ischemia and her ejection fraction was 86%.   In June 2019 she had Abdominal US at [facility] showing AAA of 4 cm.   In October 2019 she underwent right rotator cuff repair. No complications.   She was  having progressive issues with her low back that limited her activity and caused pain. She has exhausted conservative measures and underwent lumbar fusion in July by Dr Ronnald Ramp.   She was in the hospital  1/31 and discharged on 2/8- for AKI, diarrhea- diverticulitis (sigmoid colon), metabolic encephalopathy, UTI. Echo at that time was OK with normal EF and moderate TR.   She was re- admitted 2/26-02/03/21 with NSTEMI. Troponin up to 1085. Ecg showed significant inferolateral TWA. She underwent cardiac cath demonstrating patent LIMA to the LAD. The native RCA was occluded and she had severe sequential lesions in the SVG to RCA. This was treated with overlapping DES x 2- 3.0 x 26 and 3.5 x  34 mm Onyx stents.  Procedure was complicated by a left forearm hematoma. She was treated with DAPT with ASA and Brilinta. On metoprolol and high dose statin.   She was admitted for overnight observation on 02/11/21 after presenting with a localized ache in her left chest. This was different than when she presented with her NSTEMI. Ecg showed no new changes and troponin was negative x 3  On her last visit she complained of increased SOB- this was felt to be related to [place]. We switched to Plavix but she had an allergic reaction with rash and hives. Later switched to Prasugrel- reduced dose based on age and female.    She presented to the ED on 7/21 with complaints of chest pain over the past 2 days. Developed an aching sensation in the left side of her chest on Tuesday afternoon. Tried a SL nitro without relief. Also tried several reflux medications without much either. Symptoms lingered into the night and next morning she woke up with ongoing symptoms. Called the office and was instructed to present to the ED. States symptoms are exactly like what she experienced in the past with her previous cardia events.   She underwent repeat cardiac cath demonstrating restenosis of the SVG to RCA with clear evidence of prior stent fracture. She underwent repeat PCI with 3.5  x 26 mm Onyx stent post dilated to 4.0 with [place] balloon using IVUS guidance. Of note there were no collaterals to the distal RCA. There was significant aortic calcification. On July 28 she complained of some recurrent chest pain and her Imdur was increased. When seen in office on August 17 she was better. A sleep study ordered due to daytime somnolence.   She was seen in the ED on 08/27/21 with recurrent chest pain. Ecg showed no changes and troponins were negative. Reports pain the same as before. In discussion with cardiology fellow she was DC from ED with increased dose of Imdur to 90 mg daily and metoprolol to 37.5 mg bid. She was concerned so  she underwent repeat cardiac cath which showed the stent was patent.   On follow up today her chest pain symptoms have resolved. She is back exercising without problems.    Past Medical History:  Diagnosis Date   AAA (abdominal aortic aneurysm)    a. s/p stent grafting in 2009.   Adenomatous colon polyp 05/2001   Adenomatous duodenal polyp    Allergy    Anginal pain (HCC)    COPD (chronic obstructive pulmonary disease) ([place])    pt denies COPD   Coronary artery disease    a. s/p CABG in 1995;  b. 05/2013 Neg MV, EF 82%;  c. 06/2013 Cath: LM nl, LAD 40-50p, LCX nl, RCA 80p/188m, VG->RCA->PDA->PLSA min irregs, LIMA->LAD atretic, vigorous LV fxn.   Diabetes mellitus without complication (HCC)    history of, resolved. 2017   Diverticulosis    Eczema, dyshidrotic    Eczema, dyshidrotic 1986   Dr. Mathis Fare   Emphysema of lung [facility])    Fatty liver    Fibromyalgia    GERD (gastroesophageal reflux disease)    Hyperlipidemia    Hypertension    Migraine    Myocardial infarction [facility])    Osteoarthritis    Osteoporosis    unsure, having a bone scan soon   Peripheral arterial disease ([place])    left leg diagnosed by Dr Mare Ferrari   Renal artery stenosis [facility])    a. 2008 s/p PTA    Past Surgical History:  Procedure Laterality Date   ABDOMINAL AORTAGRAM N/A 03/06/2014   Procedure: ABDOMINAL AORTAGRAM;  Surgeon: Rosetta Posner, MD;  Location[facility] CATH LAB;  Service: Cardiovascular;  Laterality: N/A;   ABDOMINAL AORTIC ANEURYSM REPAIR  2009   stenting   ABDOMINAL HYSTERECTOMY     Total, 1992   BREAST BIOPSY     CARDIAC CATHETERIZATION     CARDIOVASCULAR STRESS TEST  11/11/2009   normal study   CARPAL TUNNEL RELEASE  08/1993   left wrist   CARPAL TUNNEL RELEASE  01/1997   right   CATARACT EXTRACTION, BILATERAL  2021   CORONARY ARTERY BYPASS GRAFT  07/1994   Triple by Dr Ceasar Mons   CORONARY STENT INTERVENTION N/A 02/02/2021   Procedure: CORONARY STENT INTERVENTION;  Surgeon: Nelva Bush, MD;  Location: [place] CV LAB;  Service: Cardiovascular;  Laterality: N/A;   CORONARY STENT INTERVENTION N/A 06/26/2021   Procedure: CORONARY STENT INTERVENTION;  Surgeon: Sherren Mocha, MD;  Location: [place] CV LAB;  Service: Cardiovascular;  Laterality: N/A;   dental implants     EYE SURGERY  03/2020   cataract   HAND SURGERY Right 09/2002   Right hand pulley release   INTRAVASCULAR ULTRASOUND/IVUS N/A 06/26/2021   Procedure: Intravascular Ultrasound/IVUS;  Surgeon: Sherren Mocha, MD;  Location: [facility]  INVASIVE CV LAB;  Service: Cardiovascular;  Laterality: N/A;   INTRAVASCULAR ULTRASOUND/IVUS N/A 09/03/2021   Procedure: Intravascular Ultrasound/IVUS;  Surgeon: Early Osmond, MD;  Location: [place] CV LAB;  Service: Cardiovascular;  Laterality: N/A;   LEFT HEART CATH AND CORONARY ANGIOGRAPHY N/A 06/26/2021   Procedure: LEFT HEART CATH AND CORONARY ANGIOGRAPHY;  Surgeon: Sherren Mocha, MD;  Location: [place] CV LAB;  Service: Cardiovascular;  Laterality: N/A;   LEFT HEART CATH AND CORS/GRAFTS ANGIOGRAPHY N/A 02/02/2021   Procedure: LEFT HEART CATH AND CORS/GRAFTS ANGIOGRAPHY;  Surgeon: Nelva Bush, MD;  Location: [place] CV LAB;  Service: Cardiovascular;  Laterality: N/A;   LEFT HEART CATH AND CORS/GRAFTS ANGIOGRAPHY N/A 09/03/2021   Procedure: LEFT HEART CATH AND CORS/GRAFTS ANGIOGRAPHY;  Surgeon: Early Osmond, MD;  Location: [place] CV LAB;  Service: Cardiovascular;  Laterality: N/A;   LEFT HEART CATHETERIZATION WITH CORONARY/GRAFT ANGIOGRAM N/A 06/11/2013   Procedure: LEFT HEART CATHETERIZATION WITH [name];  Surgeon: Thayer Headings, MD;  Location: [facility] CATH LAB;  Service: Cardiovascular;  Laterality: N/A;   MULTIPLE TOOTH EXTRACTIONS  2000   All upper teeth.  Has full dneture.    RENAL ARTERY STENT  2008   SHOULDER ARTHROSCOPY WITH ROTATOR CUFF REPAIR Right 09/27/2018   Procedure: Right shoulder mini open rotator cuff repair;   Surgeon: Susa Day, MD;  Location: [facility] ORS;  Service: Orthopedics;  Laterality: Right;  90 mins   thyroid cyst apiration  05/1997 and 07/1998   TONSILECTOMY, ADENOIDECTOMY, BILATERAL MYRINGOTOMY AND TUBES  1989   TONSILLECTOMY     TOTAL ABDOMINAL HYSTERECTOMY  1992     Current Outpatient Medications  Medication Sig Dispense Refill   Accu-Chek FastClix Lancets MISC Check blood sugar once daily PRN 100 each 1   ALPRAZolam (XANAX) 0.25 MG tablet Take 1 tablet (0.25 mg total) by mouth at bedtime as needed for sleep. 90 tablet 3   aspirin EC 81 MG tablet Take 1 tablet (81 mg total) by mouth daily. 90 tablet 3   atorvastatin (LIPITOR) 80 MG tablet TAKE 1 TABLET BY MOUTH EVERY DAY 90 tablet 3   B Complex-Biotin-FA (B-100 COMPLEX PO) Take 1 tablet by mouth every evening.     bismuth subsalicylate (PEPTO BISMOL) 262 MG/15ML suspension Take 30 mLs by mouth every 6 (six) hours as needed.     Budeson-Glycopyrrol-Formoterol (BREZTRI AEROSPHERE) 160-9-4.8 MCG/ACT AERO Inhale 2 puffs into the lungs in the morning and at bedtime. 10.7 g 5   cetirizine (ZYRTEC) 10 MG tablet Take 10 mg by mouth daily as needed for allergies.     cholecalciferol (VITAMIN D3) 25 MCG (1000 UNIT) tablet Take 1,000 Units by mouth every evening.     CORDRAN 4 MCG/SQCM TAPE Apply 1 each topically daily as needed (Rash).      cycloSPORINE (RESTASIS) 0.05 % ophthalmic emulsion Place 1 drop into both eyes 2 (two) times daily.      gabapentin (NEURONTIN) 300 MG capsule Take 1 capsule (300 mg total) by mouth 3 (three) times daily. 270 capsule 3   glucose blood (ACCU-CHEK SMARTVIEW) test strip CHECK BLOOD SUGAR ONCE DAILY AS NEEDED 100 strip 1   hydrochlorothiazide (HYDRODIURIL) 12.5 MG tablet Take 1 tablet (12.5 mg total) by mouth daily. 90 tablet 3   isosorbide mononitrate (IMDUR) 30 MG 24 hr tablet Take 3 tablets ( 90 mg ) every morning 270 tablet 3   metoprolol tartrate (LOPRESSOR) 25 MG tablet Take 1.5 tablets (37.5 mg total) by  mouth 2 (  two) times daily. 120 tablet 3   Multiple Vitamins-Minerals (MULTIVITAMIN WITH MINERALS) tablet Take 1 tablet by mouth every evening.     nitroGLYCERIN (NITROSTAT) 0.4 MG SL tablet Place 1 tablet (0.4 mg total) under the tongue every 5 (five) minutes as needed for chest pain. 25 tablet 11   omeprazole (PRILOSEC) 40 MG capsule Take 1 capsule (40 mg total) by mouth in the morning and at bedtime. 180 capsule 3   oxyCODONE-acetaminophen (PERCOCET) 10-325 MG tablet Take 1 tablet by mouth See admin instructions. Take 1 tablet twice daily, may take a third tablet as needed for pain     prasugrel (EFFIENT) 5 MG TABS tablet Take 5 mg by mouth daily.     PRESCRIPTION MEDICATION Place 2 puffs into both ears daily as needed (ear infections). [facility] ear powder     Simethicone (GAS-X PO) Take 1-2 tablets by mouth daily as needed (gas).     sodium chloride (OCEAN) 0.65 % SOLN nasal spray Place 1 spray into both nostrils as needed for congestion.     tretinoin (RETIN-A) 0.1 % cream Apply 1 application topically every other day. In the evening     losartan (COZAAR) 50 MG tablet Take 1 tablet (50 mg total) by mouth daily. 90 tablet 2   No current facility-administered medications for this visit.    Allergies:   Brilinta [ticagrelor], Bextra [valdecoxib], Hydrocod polst-cpm polst er, Lipitor [atorvastatin], Mevacor [lovastatin], Plavix [clopidogrel bisulfate], Zocor [simvastatin], Doxycycline, Metoprolol, Morphine and related, Codeine, and Lisinopril    Social History:  The patient  reports that she quit smoking about 25 years ago. Her smoking use included cigarettes. She has never used smokeless tobacco. She reports current alcohol use of about 6.0 - 10.0 standard drinks per week. She reports that she does not use drugs.   Family History:  The patient's family history includes AAA (abdominal aortic aneurysm) in her brother; Breast cancer in her sister; Colon cancer in her cousin and paternal grandmother;  Diabetes in her brother and sister; Heart attack in her brother, father, mother, and sister; Heart disease in her brother, brother, father, mother, and sister; Hyperlipidemia in her brother and sister; Hypertension in her brother and sister; Liver cancer in her sister.    ROS:  Please see the history of present illness.   Otherwise, review of systems are positive for none.   All other systems are reviewed and negative.    PHYSICAL EXAM: VS:  BP (!) 130/58 (BP Location: Left Arm, Patient Position: Sitting, Cuff Size: Normal)   Pulse (!) 58   Ht 5' (1.524 m)   Wt 124 lb (56.2 kg)   BMI 24.22 kg/m  , BMI Body mass index is 24.22 kg/m. GENERAL:  Overweight  WF in NAD HEENT:  PERRL, EOMI, sclera are clear. Oropharynx is clear. NECK:  No jugular venous distention, carotid upstroke brisk and symmetric, no bruits, no thyromegaly or adenopathy LUNGS:  Clear to auscultation bilaterally CHEST:  Unremarkable HEART:  RRR,  PMI not displaced or sustained,S1 and S2 within normal limits, no S3, no S4: no clicks, no rubs, no murmurs ABD:  Soft, nontender. BS +, no masses or bruits. No hepatomegaly, no splenomegaly EXT:  1 + pulses throughout, no edema, no cyanosis no clubbing SKIN:  Warm and dry.  No rashes NEURO:  Alert and oriented x 3. Cranial nerves II through XII intact. PSYCH:  Cognitively intact     EKG:  EKG is not done today.    Recent Labs:  01/08/2021: B Natriuretic Peptide 862.1 03/16/2021: Magnesium 1.6 04/20/2021: ALT 27 06/27/2021: TSH 0.714 09/04/2021: BUN 7; Creatinine, Ser 0.76; Hemoglobin 13.3; Platelets 196; Potassium 4.4; Sodium 140   Labs from primary care 12/15/16: A1c 5.6%. : Cholesterol 167 Trig-209, HDL 52, LDL 73.  AST 51, ALT 65. Normal renal indices. TSH is normal. Dated 06/24/17: CRP <0.3. Glucose 120, AST 58, ALT 80, other chemistries normal.  Dated 09/28/18: normal CBC, CMET  Lipid Panel    Component Value Date/Time   CHOL 142 06/25/2021 0408   CHOL 156 04/20/2021  0845   TRIG 100 06/25/2021 0408   HDL 49 06/25/2021 0408   HDL 59 04/20/2021 0845   CHOLHDL 2.9 06/25/2021 0408   VLDL 20 06/25/2021 0408   LDLCALC 73 06/25/2021 0408   LDLCALC 73 04/20/2021 0845   LDLDIRECT 82.0 06/13/2020 1017      Wt Readings from Last 3 Encounters:  09/23/21 124 lb (56.2 kg)  09/09/21 130 lb (59 kg)  09/03/21 125 lb (56.7 kg)    Cardiac cath / PCI 02/02/21: CORONARY STENT INTERVENTION  LEFT HEART CATH AND CORS/GRAFTS ANGIOGRAPHY    Conclusion  Conclusions: Severe single-vessel coronary artery disease with chronic total occlusion of mid RCA. Mild to moderate, diffuse LAD disease with competitive flow in the distal vessel from LIMA-LAD. Small but patent LIMA-LAD. Patent SVG-rPDA-rPL with diffuse ostial/proximal disease of up to 60% and tandem hazy 95% and 90% mid graft lesions. Successful PCI to ostial/proximal and mid SVG-rPDA-rPL using non-overlapping Resolute Onyx 3.0 x 26 mm and 3.5 x 34 mm drug-eluting stents with 0% residual stenosis and TIMI-3 flow. Left forearm hematoma following sheath removal ant TR band placement, controlled with manual compression and placement of second TR band proximal to the first band.   Recommendations: Dual antiplatelet therapy with aspirin and ticagrelor for at least 12 months. Aggressive secondary prevention. Close monitoring of left forearm hematoma.   Nelva Bush, MD [facility]    Coronary Diagrams   Diagnostic Dominance: Right    Intervention     Implants       Echo 01/31/21: IMPRESSIONS     1. Left ventricular ejection fraction, by estimation, is 60 to 65%. The  left ventricle has normal function. The left ventricle has no regional  wall motion abnormalities. Left ventricular diastolic parameters are  consistent with Grade I diastolic  dysfunction (impaired relaxation).   2. Right ventricular systolic function is normal. The right ventricular  size is normal. There is normal pulmonary  artery systolic pressure.   3. Left atrial size was severely dilated.   4. The mitral valve is grossly normal. Mild mitral valve regurgitation:  Apperance of two jets; study may underestimated regurgitation severity.   5. The aortic valve is tricuspid. Aortic valve regurgitation is not  visualized. No aortic stenosis is present.   6. The inferior vena cava is normal in size with greater than 50%  respiratory variability, suggesting right atrial pressure of 3 mmHg.   Comparison(s): A prior study was performed on 01/09/21. No significant  change from prior study. Prior images reviewed side by side. Similar to  prior.    Cardiac cath 06/26/21: Procedures  CORONARY STENT INTERVENTION  Intravascular Ultrasound/IVUS  LEFT HEART CATH AND CORONARY ANGIOGRAPHY   Conclusion      Dist LM lesion is 15% stenosed.   Prox RCA lesion is 75% stenosed.   Mid RCA to Dist RCA lesion is 100% stenosed with 100% stenosed side branch in RPAV.   Origin  to Prox Graft lesion before RPDA  is 80% stenosed.   Non-stenotic Mid Graft-1 lesion before RPDA was previously treated.   Non-stenotic Mid Graft-2 lesion before RPDA was previously treated.   A drug-eluting stent was successfully placed using a STENT ONYX FRONTIER 3.5X26.   Post intervention, there is a 20% residual stenosis.   Seq SVG- and is large.   LIMA and is small.   There is competitive flow.   1.  Stable native vessel coronary artery disease with chronic total occlusion of the proximal to mid RCA, patency of the left main, diffuse mild to moderate calcific nonobstructive LAD stenosis, and moderate nonobstructive proximal left circumflex stenosis 2.  Continued patency of the LIMA to LAD 3.  Continued patency of the sequential saphenous vein graft to PDA and posterolateral branches with severe in-stent restenosis in the proximal stent, both lesions likely related to stent fracture 4.  Successful intravascular ultrasound-guided PCI of the saphenous vein  graft to RCA using a 3.5 x 26 mm resolute Onyx DES postdilated to high pressure with a 4.0 mm noncompliant balloon.  Diagnostic Dominance: Right Intervention    Implants    Cardiac cath 09/03/21:  Early Osmond, MD (Primary)    Martinique, Keara Pagliarulo M, MD (Assisting)     Procedures  Intravascular Ultrasound/IVUS  LEFT HEART CATH AND CORS/GRAFTS ANGIOGRAPHY   Conclusion      Dist LM lesion is 15% stenosed.   Prox RCA lesion is 75% stenosed.   Mid RCA to Dist RCA lesion is 100% stenosed with 100% stenosed side branch in RPAV.   Origin to Prox Graft lesion before RPDA  is 20% stenosed.   Non-stenotic Mid Graft-1 lesion before RPDA was previously treated.   Non-stenotic Mid Graft-2 lesion before RPDA was previously treated.   Seq SVG- and is large.   LIMA and is small.   There is competitive flow.   LV end diastolic pressure is normal.    Patent vein graft to PDA with minimal constraint; IVUS interrogation demonstrated a minimal stent area of 5.2mm2. Patent LIMA to LAD with relatively unchanged burden of disease elsewhere. Normal LVEDP   Recommendations:  Medical management.    ASSESSMENT AND PLAN:  1. CAD status post CABG in 1995.  s/p NSTEMI in Feb. Cardiac cath demonstrated patent LIMA to the LAD. Occluded native RCA. SVG to PDA/PL had severe sequential stenoses treated with DES x 2. Normal EF.  Developed dyspnea on Brilinta. Allergic reaction to Plavix. Now on Prasugrel lower dose due to age and female sex. Had recurrent angina in July. Restenosis in the proximal stent in the SVG to RCA with some associated stent fracture. Had repeat stenting with IVUS guidance. Now 2 months later has recurrent angina on optimal medical therapy. Fortunately repeat cath showed the stent was still patent. We did discuss that if she had recurrence we would try Shockwave therapy. Will continue medical therapy and follow up in 4 months.  2. HTN with hypertensive heart disease. BP is under  good  control.   3. Hypercholesterolemia- LDL is at goal but triglycerides persistently elevated.   4.  Status post stent to right renal artery 2008. Patent by CT in October  5. Status post stent graft to abdominal aortic aneurysm 2009.  S/p iliac stent in 2016. Stable by CT in October  6. Peripheral artery disease followed by Dr. Donzetta Matters  7. Lumbar spine disease. S/p fusion with persistent pain. Unable to have a myelogram preferably for 6 months due to  need for DAPT  Dispostion: follow up in 4 months.   Signed, Corean Yoshimura Martinique MD, [facility]    09/23/2021 9:46 AM    [place]

## 2021-09-22 ENCOUNTER — Telehealth: Payer: Self-pay | Admitting: Gastroenterology

## 2021-09-22 ENCOUNTER — Telehealth: Payer: Self-pay

## 2021-09-22 NOTE — Telephone Encounter (Signed)
Pt called wanting to see what Dr Jonni Sanger would advise for her to do. Armando stated that she was exposed on Saturday to two people that tested positive for covid. She stated that she feels fine but her temperature has gone up to 99 and her throat is a little scratch. Megan [name] did state that she did do a home test and that was negative. Please Advise.

## 2021-09-22 NOTE — Telephone Encounter (Signed)
Inbound call from is having diarrhea and believes it is a flare of her diverticulitis. Patient recently had CT scan 10/14 that showed a colonic diverticular disease. Best contact number [phone]

## 2021-09-22 NOTE — Telephone Encounter (Signed)
I contacted the patient she states she is sorry for the call.  She misread her CT as there was evidence of inflammation.  She stated she figured that out after she called.  She thanked me for returning her call and understands to call back for additional questions or concerns.

## 2021-09-23 ENCOUNTER — Encounter: Payer: Self-pay | Admitting: Cardiology

## 2021-09-23 ENCOUNTER — Other Ambulatory Visit: Payer: Self-pay

## 2021-09-23 ENCOUNTER — Ambulatory Visit (INDEPENDENT_AMBULATORY_CARE_PROVIDER_SITE_OTHER): Payer: Medicare Other | Admitting: Cardiology

## 2021-09-23 VITALS — BP 130/58 | HR 58 | Ht 60.0 in | Wt 124.0 lb

## 2021-09-23 DIAGNOSIS — E782 Mixed hyperlipidemia: Secondary | ICD-10-CM | POA: Diagnosis not present

## 2021-09-23 DIAGNOSIS — I119 Hypertensive heart disease without heart failure: Secondary | ICD-10-CM | POA: Diagnosis not present

## 2021-09-23 DIAGNOSIS — I25700 Atherosclerosis of coronary artery bypass graft(s), unspecified, with unstable angina pectoris: Secondary | ICD-10-CM | POA: Diagnosis not present

## 2021-09-23 NOTE — Telephone Encounter (Signed)
Spoke with patient, gave a verbal understanding to monitor symptoms. States she tested again today and was negative

## 2021-09-23 NOTE — Telephone Encounter (Signed)
Please advise 

## 2021-09-30 ENCOUNTER — Other Ambulatory Visit: Payer: Self-pay

## 2021-09-30 ENCOUNTER — Ambulatory Visit (INDEPENDENT_AMBULATORY_CARE_PROVIDER_SITE_OTHER): Payer: Medicare Other | Admitting: Vascular Surgery

## 2021-09-30 ENCOUNTER — Encounter: Payer: Self-pay | Admitting: Vascular Surgery

## 2021-09-30 VITALS — BP 151/67 | HR 57 | Temp 98.3°F | Resp 20 | Ht 60.0 in | Wt 126.0 lb

## 2021-09-30 DIAGNOSIS — Z95828 Presence of other vascular implants and grafts: Secondary | ICD-10-CM

## 2021-09-30 NOTE — Progress Notes (Signed)
Patient ID: Megan [name], female   DOB: 01-Jul-1946, 75 y.o.   MRN: [id number]  Reason for Consult: Follow-up   Referred by [name], MD  Subjective:     HPI:  Megan [name] is a 75 y.o. female with history of abdominal aortic aneurysm that was repaired in 2009 endovascularly and also has been eventually with left common iliac artery stent.  There is also a right renal artery stent.  She does have back pain this is not new with previous history of lumbar fusion.  She also recently underwent intervention of her coronary artery stents within her bypass she has done very well from this.  She remains at her usual level of activity.  Past Medical History:  Diagnosis Date   AAA (abdominal aortic aneurysm)    a. s/p stent grafting in 2009.   Adenomatous colon polyp 05/2001   Adenomatous duodenal polyp    Allergy    Anginal pain (HCC)    COPD (chronic obstructive pulmonary disease) ([place])    pt denies COPD   Coronary artery disease    a. s/p CABG in 1995;  b. 05/2013 Neg MV, EF 82%;  c. 06/2013 Cath: LM nl, LAD 40-50p, LCX nl, RCA 80p/164m, VG->RCA->PDA->PLSA min irregs, LIMA->LAD atretic, vigorous LV fxn.   Diabetes mellitus without complication (HCC)    history of, resolved. 2017   Diverticulosis    Eczema, dyshidrotic    Eczema, dyshidrotic 1986   Dr. Mathis Fare   Emphysema of lung [facility])    Fatty liver    Fibromyalgia    GERD (gastroesophageal reflux disease)    Hyperlipidemia    Hypertension    Migraine    Myocardial infarction [facility])    Osteoarthritis    Osteoporosis    unsure, having a bone scan soon   Peripheral arterial disease ([place])    left leg diagnosed by Dr Mare Ferrari   Renal artery stenosis [facility])    a. 2008 s/p PTA   Family History  Problem Relation Age of Onset   Heart disease Mother    Heart attack Mother    Heart disease Father        before age 56   Heart attack Father    Heart disease Sister        before age 70   Diabetes Sister     Hyperlipidemia Sister    Hypertension Sister    Heart attack Sister    Liver cancer Sister    Breast cancer Sister    Heart disease Brother    Diabetes Brother    Hyperlipidemia Brother    Hypertension Brother    Heart attack Brother    AAA (abdominal aortic aneurysm) Brother    Colon cancer Paternal Grandmother    Heart disease Brother    Colon cancer Cousin        first cousin on father's side   Pancreatic cancer Neg Hx    Rectal cancer Neg Hx    Stomach cancer Neg Hx    Esophageal cancer Neg Hx    Past Surgical History:  Procedure Laterality Date   ABDOMINAL AORTAGRAM N/A 03/06/2014   Procedure: ABDOMINAL AORTAGRAM;  Surgeon: Rosetta Posner, MD;  Location: [facility] CATH LAB;  Service: Cardiovascular;  Laterality: N/A;   ABDOMINAL AORTIC ANEURYSM REPAIR  2009   stenting   ABDOMINAL HYSTERECTOMY     Total, 1992   BREAST BIOPSY     CARDIAC CATHETERIZATION     CARDIOVASCULAR STRESS TEST  11/11/2009   normal study   CARPAL TUNNEL RELEASE  08/1993   left wrist   CARPAL TUNNEL RELEASE  01/1997   right   CATARACT EXTRACTION, BILATERAL  2021   CORONARY ARTERY BYPASS GRAFT  07/1994   Triple by Dr Ceasar Mons   CORONARY STENT INTERVENTION N/A 02/02/2021   Procedure: CORONARY STENT INTERVENTION;  Surgeon: Nelva Bush, MD;  Location: [place] CV LAB;  Service: Cardiovascular;  Laterality: N/A;   CORONARY STENT INTERVENTION N/A 06/26/2021   Procedure: CORONARY STENT INTERVENTION;  Surgeon: Sherren Mocha, MD;  Location: [place] CV LAB;  Service: Cardiovascular;  Laterality: N/A;   dental implants     EYE SURGERY  03/2020   cataract   HAND SURGERY Right 09/2002   Right hand pulley release   INTRAVASCULAR ULTRASOUND/IVUS N/A 06/26/2021   Procedure: Intravascular Ultrasound/IVUS;  Surgeon: Sherren Mocha, MD;  Location: [place] CV LAB;  Service: Cardiovascular;  Laterality: N/A;   INTRAVASCULAR ULTRASOUND/IVUS N/A 09/03/2021   Procedure: Intravascular Ultrasound/IVUS;  Surgeon:  Early Osmond, MD;  Location: [place] CV LAB;  Service: Cardiovascular;  Laterality: N/A;   LEFT HEART CATH AND CORONARY ANGIOGRAPHY N/A 06/26/2021   Procedure: LEFT HEART CATH AND CORONARY ANGIOGRAPHY;  Surgeon: Sherren Mocha, MD;  Location: [place] CV LAB;  Service: Cardiovascular;  Laterality: N/A;   LEFT HEART CATH AND CORS/GRAFTS ANGIOGRAPHY N/A 02/02/2021   Procedure: LEFT HEART CATH AND CORS/GRAFTS ANGIOGRAPHY;  Surgeon: Nelva Bush, MD;  Location: [place] CV LAB;  Service: Cardiovascular;  Laterality: N/A;   LEFT HEART CATH AND CORS/GRAFTS ANGIOGRAPHY N/A 09/03/2021   Procedure: LEFT HEART CATH AND CORS/GRAFTS ANGIOGRAPHY;  Surgeon: Early Osmond, MD;  Location: [place] CV LAB;  Service: Cardiovascular;  Laterality: N/A;   LEFT HEART CATHETERIZATION WITH CORONARY/GRAFT ANGIOGRAM N/A 06/11/2013   Procedure: LEFT HEART CATHETERIZATION WITH [name];  Surgeon: Thayer Headings, MD;  Location[facility] CATH LAB;  Service: Cardiovascular;  Laterality: N/A;   MULTIPLE TOOTH EXTRACTIONS  2000   All upper teeth.  Has full dneture.    RENAL ARTERY STENT  2008   SHOULDER ARTHROSCOPY WITH ROTATOR CUFF REPAIR Right 09/27/2018   Procedure: Right shoulder mini open rotator cuff repair;  Surgeon: Susa Day, MD;  Location: [facility] ORS;  Service: Orthopedics;  Laterality: Right;  90 mins   thyroid cyst apiration  05/1997 and 07/1998   TONSILECTOMY, ADENOIDECTOMY, BILATERAL MYRINGOTOMY AND TUBES  1989   TONSILLECTOMY     TOTAL ABDOMINAL HYSTERECTOMY  1992    Short Social History:  Social History   Tobacco Use   Smoking status: Former    Packs/day: 0.00    Years: 37.00    Pack years: 0.00    Types: Cigarettes    Quit date: 12/07/1995    Years since quitting: 25.8   Smokeless tobacco: Never  Substance Use Topics   Alcohol use: Yes    Alcohol/week: 6.0 - 10.0 standard drinks    Types: 6 - 10 Glasses of wine per week    Comment: 2-3 glasses daily    Allergies   Allergen Reactions   Brilinta [Ticagrelor] Shortness Of Breath   Bextra [Valdecoxib] Other (See Comments)    Increased lft's    Hydrocod Polst-Cpm Polst Er Rash   Lipitor [Atorvastatin] Other (See Comments)    Leg weakness    Mevacor [Lovastatin] Other (See Comments)    Muscle weakness    Plavix [Clopidogrel Bisulfate] Itching and Other (See Comments)    Hands  and feet tingle and itch   Zocor [Simvastatin] Other (See Comments)    Bones hurt    Doxycycline Diarrhea and Nausea And Vomiting   Metoprolol Other (See Comments)    Hair loss   Morphine And Related Itching and Nausea Only   Codeine Itching and Rash    Hyper-active   Lisinopril Cough    Current Outpatient Medications  Medication Sig Dispense Refill   Accu-Chek FastClix Lancets MISC Check blood sugar once daily PRN 100 each 1   ALPRAZolam (XANAX) 0.25 MG tablet Take 1 tablet (0.25 mg total) by mouth at bedtime as needed for sleep. 90 tablet 3   aspirin EC 81 MG tablet Take 1 tablet (81 mg total) by mouth daily. 90 tablet 3   atorvastatin (LIPITOR) 80 MG tablet TAKE 1 TABLET BY MOUTH EVERY DAY 90 tablet 3   B Complex-Biotin-FA (B-100 COMPLEX PO) Take 1 tablet by mouth every evening.     bismuth subsalicylate (PEPTO BISMOL) 262 MG/15ML suspension Take 30 mLs by mouth every 6 (six) hours as needed.     Budeson-Glycopyrrol-Formoterol (BREZTRI AEROSPHERE) 160-9-4.8 MCG/ACT AERO Inhale 2 puffs into the lungs in the morning and at bedtime. 10.7 g 5   cetirizine (ZYRTEC) 10 MG tablet Take 10 mg by mouth daily as needed for allergies.     cholecalciferol (VITAMIN D3) 25 MCG (1000 UNIT) tablet Take 1,000 Units by mouth every evening.     CORDRAN 4 MCG/SQCM TAPE Apply 1 each topically daily as needed (Rash).      cycloSPORINE (RESTASIS) 0.05 % ophthalmic emulsion Place 1 drop into both eyes 2 (two) times daily.      gabapentin (NEURONTIN) 300 MG capsule Take 1 capsule (300 mg total) by mouth 3 (three) times daily. 270 capsule 3    glucose blood (ACCU-CHEK SMARTVIEW) test strip CHECK BLOOD SUGAR ONCE DAILY AS NEEDED 100 strip 1   hydrochlorothiazide (HYDRODIURIL) 12.5 MG tablet Take 1 tablet (12.5 mg total) by mouth daily. 90 tablet 3   isosorbide mononitrate (IMDUR) 30 MG 24 hr tablet Take 3 tablets ( 90 mg ) every morning 270 tablet 3   metoprolol tartrate (LOPRESSOR) 25 MG tablet Take 1.5 tablets (37.5 mg total) by mouth 2 (two) times daily. 120 tablet 3   Multiple Vitamins-Minerals (MULTIVITAMIN WITH MINERALS) tablet Take 1 tablet by mouth every evening.     nitroGLYCERIN (NITROSTAT) 0.4 MG SL tablet Place 1 tablet (0.4 mg total) under the tongue every 5 (five) minutes as needed for chest pain. 25 tablet 11   omeprazole (PRILOSEC) 40 MG capsule Take 1 capsule (40 mg total) by mouth in the morning and at bedtime. 180 capsule 3   oxyCODONE-acetaminophen (PERCOCET) 10-325 MG tablet Take 1 tablet by mouth See admin instructions. Take 1 tablet twice daily, may take a third tablet as needed for pain     prasugrel (EFFIENT) 5 MG TABS tablet Take 5 mg by mouth daily.     PRESCRIPTION MEDICATION Place 2 puffs into both ears daily as needed (ear infections). [facility] ear powder     Simethicone (GAS-X PO) Take 1-2 tablets by mouth daily as needed (gas).     sodium chloride (OCEAN) 0.65 % SOLN nasal spray Place 1 spray into both nostrils as needed for congestion.     tretinoin (RETIN-A) 0.1 % cream Apply 1 application topically every other day. In the evening     losartan (COZAAR) 50 MG tablet Take 1 tablet (50 mg total) by mouth daily. 90 tablet  2   No current facility-administered medications for this visit.    Review of Systems  Constitutional:  Constitutional negative. HENT: HENT negative.  Eyes: Eyes negative.  Respiratory: Respiratory negative.  Cardiovascular: Cardiovascular negative.  GI: Gastrointestinal negative.  Musculoskeletal: Positive for back pain.  Skin: Skin negative.  Neurological: Neurological  negative. Hematologic: Hematologic/lymphatic negative.  Psychiatric: Psychiatric negative.       Objective:  Objective   Vitals:   09/30/21 1006  BP: (!) 151/67  Pulse: (!) 57  Resp: 20  Temp: 98.3 F (36.8 C)  SpO2: 94%  Weight: 126 lb (57.2 kg)  Height: 5' (1.524 m)   Body mass index is 24.61 kg/m.  Physical Exam HENT:     Head: Normocephalic.     Nose:     Comments: Wearing a mask Eyes:     Pupils: Pupils are equal, round, and reactive to light.  Cardiovascular:     Rate and Rhythm: Normal rate.     Pulses: Normal pulses.  Pulmonary:     Effort: Pulmonary effort is normal.  Abdominal:     General: Abdomen is flat.     Palpations: Abdomen is soft. There is no mass.  Musculoskeletal:        General: Normal range of motion.     Cervical back: Normal range of motion and neck supple.     Right lower leg: No edema.     Left lower leg: No edema.  Skin:    General: Skin is warm.     Capillary Refill: Capillary refill takes less than 2 seconds.  Neurological:     General: No focal deficit present.     Mental Status: She is alert.  Psychiatric:        Mood and Affect: Mood normal.        Behavior: Behavior normal.        Thought Content: Thought content normal.        Judgment: Judgment normal.    Data: CT IMPRESSION: VASCULAR   1. Stable successful endovascular aortic repair of infrarenal abdominal aortic aneurysm without evidence of endoleak or other complication. Excluded aneurysm sac size remains unchanged at 4.1 x 4.0 cm. 2. 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 3. 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 4. 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     Assessment/Plan:     75 year old female status post endovascular aneurysm repair many years ago around 2009 with subsequent in-stent stenting.  She is on very well from this.  Her aneurysm appears to be  excluded and the sac size remains stable.  I discussed with her now following with duplex and we will see her in 1 year unless she has issues prior.     Waynetta Sandy MD [facility]

## 2021-10-12 ENCOUNTER — Institutional Professional Consult (permissible substitution): Payer: Medicare Other | Admitting: Diagnostic Neuroimaging

## 2021-10-13 ENCOUNTER — Other Ambulatory Visit: Payer: Self-pay

## 2021-10-13 ENCOUNTER — Ambulatory Visit (HOSPITAL_BASED_OUTPATIENT_CLINIC_OR_DEPARTMENT_OTHER): Payer: Medicare Other | Attending: General Practice | Admitting: Cardiovascular Disease

## 2021-10-13 DIAGNOSIS — R400 Somnolence: Secondary | ICD-10-CM | POA: Diagnosis not present

## 2021-10-13 DIAGNOSIS — R0902 Hypoxemia: Secondary | ICD-10-CM | POA: Diagnosis not present

## 2021-10-13 DIAGNOSIS — R0683 Snoring: Secondary | ICD-10-CM | POA: Diagnosis not present

## 2021-10-15 ENCOUNTER — Other Ambulatory Visit: Payer: Self-pay | Admitting: Family Medicine

## 2021-10-15 DIAGNOSIS — Z1231 Encounter for screening mammogram for malignant neoplasm of breast: Secondary | ICD-10-CM

## 2021-10-21 ENCOUNTER — Other Ambulatory Visit: Payer: Self-pay | Admitting: Cardiology

## 2021-10-22 DIAGNOSIS — Z9889 Other specified postprocedural states: Secondary | ICD-10-CM | POA: Diagnosis not present

## 2021-10-22 DIAGNOSIS — M461 Sacroiliitis, not elsewhere classified: Secondary | ICD-10-CM | POA: Diagnosis not present

## 2021-10-26 ENCOUNTER — Encounter (HOSPITAL_BASED_OUTPATIENT_CLINIC_OR_DEPARTMENT_OTHER): Payer: Self-pay | Admitting: Cardiovascular Disease

## 2021-10-26 NOTE — Procedures (Signed)
Patient Name: Megan [name], Megan [name] Date: 10/13/2021 Gender: Female D.O.B: [birth year] Age (years): 37 Referring Provider: Coletta Memos NP Height (inches): 60 Interpreting Physician: Shelva Majestic MD, ABSM Weight (lbs): 125 RPSGT: Zadie Rhine BMI: 24 MRN: [id number] Neck Size: 13.50  CLINICAL INFORMATION Sleep Study Type: NPSG  Indication for sleep study: OSA, daytime sleepiness  Epworth Sleepiness Score: 11  SLEEP STUDY TECHNIQUE As per the AASM Manual for the Scoring of Sleep and Associated Events v2.3 (April 2016) with a hypopnea requiring 4% desaturations.  The channels recorded and monitored were frontal, central and occipital EEG, electrooculogram (EOG), submentalis EMG (chin), nasal and oral airflow, thoracic and abdominal wall motion, anterior tibialis EMG, snore microphone, electrocardiogram, and pulse oximetry.  MEDICATIONS Accu-Chek FastClix Lancets MISC ALPRAZolam (XANAX) 0.25 MG tablet aspirin EC 81 MG tablet atorvastatin (LIPITOR) 80 MG tablet B Complex-Biotin-FA (B-100 COMPLEX PO) bismuth subsalicylate (PEPTO BISMOL) 262 MG/15ML suspension Budeson-Glycopyrrol-Formoterol (BREZTRI AEROSPHERE) 160-9-4.8 MCG/ACT AERO cetirizine (ZYRTEC) 10 MG tablet cholecalciferol (VITAMIN D3) 25 MCG (1000 UNIT) tablet CORDRAN 4 MCG/SQCM TAPE cycloSPORINE (RESTASIS) 0.05 % ophthalmic emulsion gabapentin (NEURONTIN) 300 MG capsule glucose blood (ACCU-CHEK SMARTVIEW) test strip hydrochlorothiazide (HYDRODIURIL) 12.5 MG tablet isosorbide mononitrate (IMDUR) 30 MG 24 hr tablet losartan (COZAAR) 50 MG tablet (Expired) metoprolol tartrate (LOPRESSOR) 25 MG tablet Multiple Vitamins-Minerals (MULTIVITAMIN WITH MINERALS) tablet nitroGLYCERIN (NITROSTAT) 0.4 MG SL tablet omeprazole (PRILOSEC) 40 MG capsule oxyCODONE-acetaminophen (PERCOCET) 10-325 MG tablet prasugrel (EFFIENT) 5 MG TABS tablet Simethicone (GAS-X PO) sodium chloride (OCEAN) 0.65 % SOLN nasal  spray tretinoin (RETIN-A) 0.1 % cream  Medications self-administered by patient taken the night of the study : N/A  SLEEP ARCHITECTURE The study was initiated at 9:30:28 PM and ended at 4:31:50 AM.  Sleep onset time was 4.3 minutes and the sleep efficiency was 96.8%%. The total sleep time was 408 minutes.  Stage REM latency was 51.0 minutes.  The patient spent 2.1%% of the night in stage N1 sleep, 65.9%% in stage N2 sleep, 10.7%% in stage N3 and 21.3% in REM.  Alpha intrusion was absent.  Supine sleep was 7.48%.  RESPIRATORY PARAMETERS The overall apnea/hypopnea index (AHI) was 1.0 per hour.  The respiratory disturbance index (RDI) was 5.1/h. There were 0 total apneas, including 0 obstructive, 0 central and 0 mixed apneas. There were 7 hypopneas and 28 RERAs.  The AHI during Stage REM sleep was 0.0 per hour.  AHI while supine was 13.8 per hour.  The mean oxygen saturation was 93.7%. The minimum SpO2 during sleep was 85.0%.  Moderate snoring was noted during this study.  CARDIAC DATA The 2 lead EKG demonstrated sinus rhythm. The mean heart rate was 58.7 beats per minute. Other EKG findings include: None.  LEG MOVEMENT DATA The total PLMS were 0 with a resulting PLMS index of 0.0. Associated arousal with leg movement index was 0.0 .  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 - Mild oxygen desaturation to a nadir of 85%. - The patient snored with moderate snoring volume. - No cardiac abnormalities were noted during this study. - Clinically significant periodic limb movements did not occur during sleep. No significant associated arousals.  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  - Effort should be made to optimize nasal and oropharyngeal patency.  - The patient should be  counseled to avoid supine sleep; consider positional  therapy avoiding supine position during sleep. - If persistant daytime sleepiness persists consider a multiple sleep latency test (MSLT) to evaluate for idiopathic hypersomnolence or narcolepsy.  - Avoid alcohol, sedatives and other CNS depressants that may worsen sleep apnea and disrupt normal sleep architecture. - Sleep hygiene should be reviewed to assess factors that may improve sleep quality. - Weight management and regular exercise should be initiated or continued if appropriate.  [Electronically signed] 10/26/2021 11:07 AM  Shelva Majestic MD, [facility], [place], American Board of Sleep Medicine   NPI: [id number] [place] PH: [phone]   FX: [phone] [place]

## 2021-10-27 ENCOUNTER — Telehealth: Payer: Self-pay | Admitting: *Deleted

## 2021-10-27 ENCOUNTER — Other Ambulatory Visit: Payer: Self-pay | Admitting: Cardiovascular Disease

## 2021-10-27 DIAGNOSIS — R0683 Snoring: Secondary | ICD-10-CM

## 2021-10-27 DIAGNOSIS — R5383 Other fatigue: Secondary | ICD-10-CM

## 2021-10-27 DIAGNOSIS — R400 Somnolence: Secondary | ICD-10-CM

## 2021-10-27 NOTE — Telephone Encounter (Signed)
-----   Message from Troy Sine, MD sent at 10/26/2021 11:15 AM EST ----- Mariann Laster, please notify pt of results

## 2021-10-27 NOTE — Telephone Encounter (Signed)
Patient notified of sleep study results and recommendations. She wants to proceed with having MLST done. She states that she will now fall asleep when driving.

## 2021-11-04 ENCOUNTER — Ambulatory Visit: Payer: Medicare Other | Admitting: Cardiology

## 2021-11-09 ENCOUNTER — Ambulatory Visit (INDEPENDENT_AMBULATORY_CARE_PROVIDER_SITE_OTHER): Payer: Medicare Other | Admitting: Internal Medicine

## 2021-11-09 ENCOUNTER — Encounter: Payer: Self-pay | Admitting: Internal Medicine

## 2021-11-09 ENCOUNTER — Other Ambulatory Visit: Payer: Self-pay

## 2021-11-09 VITALS — BP 124/78 | HR 61 | Ht 60.0 in | Wt 124.0 lb

## 2021-11-09 DIAGNOSIS — E042 Nontoxic multinodular goiter: Secondary | ICD-10-CM

## 2021-11-09 LAB — TSH: TSH: 1.62 u[IU]/mL (ref 0.35–5.50)

## 2021-11-09 LAB — T4, FREE: Free T4: 0.85 ng/dL (ref 0.60–1.60)

## 2021-11-09 NOTE — Progress Notes (Signed)
Name: Megan [name]  MRN/ DOB: [id number], 1945/12/30    Age/ Sex: 75 y.o., female    PCP: [name], MD   Reason for Endocrinology Evaluation: MNG     Date of Initial Endocrinology Evaluation: 11/09/2021     HPI: [name] is a 75 y.o. female with a past medical history of PVD, MNG , COPD and CAD . The patient presented for initial endocrinology clinic visit on 11/09/2021 for consultative assistance with her MNG.   She had an incidental finding of a right thyroid nodule 2.1 cm on CT scan which promoted a thyroid ultrasound revealing MNG with a right inferior 2.5 nodule meeting FNA criteria.    She has been diagnosed with thyroid nodules  ~25 years ago . She has has at least two aspirations , the last one in her 31's. [name] until 3 yrs ago  ( Megan [name] )   She Denies local neck enlargement  Denies dysphagia but has difficulty with pill swallowing  Denies palpitations  Has noted diarrhea lately  Has mild tremors.   Denies Biotin just MVI   Sister with thyroid disease , S/P RAi ablation      HISTORY:  Past Medical History:  Past Medical History:  Diagnosis Date   AAA (abdominal aortic aneurysm)    a. s/p stent grafting in 2009.   Adenomatous colon polyp 05/2001   Adenomatous duodenal polyp    Allergy    Anginal pain (HCC)    COPD (chronic obstructive pulmonary disease) ([place])    pt denies COPD   Coronary artery disease    a. s/p CABG in 1995;  b. 05/2013 Neg MV, EF 82%;  c. 06/2013 Cath: LM nl, LAD 40-50p, LCX nl, RCA 80p/136m, VG->RCA->PDA->PLSA min irregs, LIMA->LAD atretic, vigorous LV fxn.   Diabetes mellitus without complication (HCC)    history of, resolved. 2017   Diverticulosis    Eczema, dyshidrotic    Eczema, dyshidrotic 1986   Megan [name]   Emphysema of lung [facility])    Fatty liver    Fibromyalgia    GERD (gastroesophageal reflux disease)    Hyperlipidemia    Hypertension    Migraine    Myocardial infarction [facility])     Osteoarthritis    Osteoporosis    unsure, having a bone scan soon   Peripheral arterial disease ([place]egan)    left leg diagnosed by Megan [name]   Renal artery stenosis [facility])    a. 2008 s/p PTA   Past Surgical History:  Past Surgical History:  Procedure Laterality Date   ABDOMINAL AORTAGRAM N/A 03/06/2014   Procedure: ABDOMINAL AORTAGRAM;  Surgeon: Megan Posner, MD;  Location: [facility] CATH LAB;  Service: Cardiovascular;  Laterality: N/A;   ABDOMINAL AORTIC ANEURYSM REPAIR  2009   stenting   ABDOMINAL HYSTERECTOMY     Total, 1992   BREAST BIOPSY     CARDIAC CATHETERIZATION     CARDIOVASCULAR STRESS TEST  11/11/2009   normal study   CARPAL TUNNEL RELEASE  08/1993   left wrist   CARPAL TUNNEL RELEASE  01/1997   right   CATARACT EXTRACTION, BILATERAL  2021   CORONARY ARTERY BYPASS GRAFT  07/1994   Triple by Megan Megan [name]   CORONARY STENT INTERVENTION N/A 02/02/2021   Procedure: CORONARY STENT INTERVENTION;  Surgeon: Megan Bush, MD;  Location: [place] CV LAB;  Service: Cardiovascular;  Laterality: N/A;   CORONARY STENT INTERVENTION N/A 06/26/2021   Procedure:  CORONARY STENT INTERVENTION;  Surgeon: Megan Mocha, MD;  Location: [place] CV LAB;  Service: Cardiovascular;  Laterality: N/A;   dental implants     EYE SURGERY  03/2020   cataract   HAND SURGERY Right 09/2002   Right hand pulley release   INTRAVASCULAR ULTRASOUND/IVUS N/A 06/26/2021   Procedure: Intravascular Ultrasound/IVUS;  Surgeon: Megan Mocha, MD;  Location: [place] CV LAB;  Service: Cardiovascular;  Laterality: N/A;   INTRAVASCULAR ULTRASOUND/IVUS N/A 09/03/2021   Procedure: Intravascular Ultrasound/IVUS;  Surgeon: Megan Osmond, MD;  Location: [place] CV LAB;  Service: Cardiovascular;  Laterality: N/A;   LEFT HEART CATH AND CORONARY ANGIOGRAPHY N/A 06/26/2021   Procedure: LEFT HEART CATH AND CORONARY ANGIOGRAPHY;  Surgeon: Megan Mocha, MD;  Location: [place] CV LAB;  Service: Cardiovascular;   Laterality: N/A;   LEFT HEART CATH AND CORS/GRAFTS ANGIOGRAPHY N/A 02/02/2021   Procedure: LEFT HEART CATH AND CORS/GRAFTS ANGIOGRAPHY;  Surgeon: Megan Bush, MD;  Location: [place] CV LAB;  Service: Cardiovascular;  Laterality: N/A;   LEFT HEART CATH AND CORS/GRAFTS ANGIOGRAPHY N/A 09/03/2021   Procedure: LEFT HEART CATH AND CORS/GRAFTS ANGIOGRAPHY;  Surgeon: Megan Osmond, MD;  Location: [facility] CV LAB;  Service: Cardiovascular;  Laterality: N/A;   LEFT HEART CATHETERIZATION WITH CORONARY/GRAFT ANGIOGRAM N/A 06/11/2013   Procedure: LEFT HEART CATHETERIZATION WITH Megan [name];  Surgeon: Megan Headings, MD;  Location[facility] CATH LAB;  Service: Cardiovascular;  Laterality: N/A;   MULTIPLE TOOTH EXTRACTIONS  2000   All upper teeth.  Has full dneture.    RENAL ARTERY STENT  2008   SHOULDER ARTHROSCOPY WITH ROTATOR CUFF REPAIR Right 09/27/2018   Procedure: Right shoulder mini open rotator cuff repair;  Surgeon: Megan Day, MD;  Location: [facility] ORS;  Service: Orthopedics;  Laterality: Right;  90 mins   thyroid cyst apiration  05/1997 and 07/1998   TONSILECTOMY, ADENOIDECTOMY, BILATERAL MYRINGOTOMY AND TUBES  1989   TONSILLECTOMY     TOTAL ABDOMINAL HYSTERECTOMY  1992    Social History:  reports that she quit smoking about 25 years ago. Her smoking use included cigarettes. She has never used smokeless tobacco. She reports current alcohol use of about 6.0 - 10.0 standard drinks per week. She reports that she does not use drugs. Family History: family history includes AAA (abdominal aortic aneurysm) in her brother; Breast cancer in her sister; Colon cancer in her cousin and paternal grandmother; Diabetes in her brother and sister; Heart attack in her brother, father, mother, and sister; Heart disease in her brother, brother, father, mother, and sister; Hyperlipidemia in her brother and sister; Hypertension in her brother and sister; Liver cancer in her sister.   HOME  MEDICATIONS: Allergies as of 11/09/2021       Reactions   Brilinta [ticagrelor] Shortness Of Breath   Bextra [valdecoxib] Other (See Comments)   Increased lft's   Hydrocod Polst-cpm Polst Er Rash   Lipitor [atorvastatin] Other (See Comments)   Leg weakness   Mevacor [lovastatin] Other (See Comments)   Muscle weakness   Plavix [clopidogrel Bisulfate] Itching, Other (See Comments)   Hands and feet tingle and itch   Zocor [simvastatin] Other (See Comments)   Bones hurt   Doxycycline Diarrhea, Nausea And Vomiting   Metoprolol Other (See Comments)   Hair loss   Morphine And Related Itching, Nausea Only   Codeine Itching, Rash   Hyper-active   Lisinopril Cough        Medication List  Accurate as of November 09, 2021 10:37 AM. If you have any questions, ask your nurse or doctor.          Accu-Chek FastClix Lancets Misc Check blood sugar once daily PRN   Accu-Chek SmartView test strip Generic drug: glucose blood CHECK BLOOD SUGAR ONCE DAILY AS NEEDED   ALPRAZolam 0.25 MG tablet Commonly known as: XANAX Take 1 tablet (0.25 mg total) by mouth at bedtime as needed for sleep.   aspirin EC 81 MG tablet Take 1 tablet (81 mg total) by mouth daily.   atorvastatin 80 MG tablet Commonly known as: LIPITOR TAKE 1 TABLET BY MOUTH EVERY [name]   B-100 COMPLEX PO Take 1 tablet by mouth every evening.   bismuth subsalicylate 779 TJ/03ES suspension Commonly known as: PEPTO BISMOL Take 30 mLs by mouth every 6 (six) hours as needed.   Breztri Aerosphere 160-9-4.8 MCG/ACT Aero Generic drug: Budeson-Glycopyrrol-Formoterol Inhale 2 puffs into the lungs in the morning and at bedtime.   cetirizine 10 MG tablet Commonly known as: ZYRTEC Take 10 mg by mouth daily as needed for allergies.   cholecalciferol 25 MCG (1000 UNIT) tablet Commonly known as: VITAMIN D3 Take 1,000 Units by mouth every evening.   Cordran 4 MCG/SQCM Tape Generic drug: Flurandrenolide Apply 1 each  topically daily as needed (Rash).   cycloSPORINE 0.05 % ophthalmic emulsion Commonly known as: RESTASIS Place 1 drop into both eyes 2 (two) times daily.   gabapentin 300 MG capsule Commonly known as: NEURONTIN Take 1 capsule (300 mg total) by mouth 3 (three) times daily.   GAS-X PO Take 1-2 tablets by mouth daily as needed (gas).   hydrochlorothiazide 12.5 MG tablet Commonly known as: HYDRODIURIL Take 1 tablet (12.5 mg total) by mouth daily.   isosorbide mononitrate 30 MG 24 hr tablet Commonly known as: IMDUR Take 3 tablets ( 90 mg ) every morning   losartan 50 MG tablet Commonly known as: COZAAR Take 1 tablet (50 mg total) by mouth daily.   metoprolol tartrate 25 MG tablet Commonly known as: LOPRESSOR Take 1.5 tablets (37.5 mg total) by mouth 2 (two) times daily.   multivitamin with minerals tablet Take 1 tablet by mouth every evening.   nitroGLYCERIN 0.4 MG SL tablet Commonly known as: NITROSTAT Place 1 tablet (0.4 mg total) under the tongue every 5 (five) minutes as needed for chest pain.   omeprazole 40 MG capsule Commonly known as: PRILOSEC Take 1 capsule (40 mg total) by mouth in the morning and at bedtime.   oxyCODONE-acetaminophen 10-325 MG tablet Commonly known as: PERCOCET Take 1 tablet by mouth See admin instructions. Take 1 tablet twice daily, may take a third tablet as needed for pain   prasugrel 5 MG Tabs tablet Commonly known as: EFFIENT Take 5 mg by mouth daily.   PRESCRIPTION MEDICATION Place 2 puffs into both ears daily as needed (ear infections). CSAHC ear powder   sodium chloride 0.65 % Soln nasal spray Commonly known as: OCEAN Place 1 spray into both nostrils as needed for congestion.   tretinoin 0.1 % cream Commonly known as: RETIN-A Apply 1 application topically every other [name]. In the evening          REVIEW OF SYSTEMS: A comprehensive ROS was conducted with the patient and is negative except as per HPI    OBJECTIVE:  VS: BP  124/78 (BP Location: Left Arm, Patient Position: Sitting, Cuff Size: Small)   Pulse 61   Ht 5' (1.524 m)   Wt  124 lb (56.2 kg)   SpO2 98%   BMI 24.22 kg/m    Wt Readings from Last 3 Encounters:  11/09/21 124 lb (56.2 kg)  10/13/21 125 lb (56.7 kg)  09/30/21 126 lb (57.2 kg)     EXAM: General: Pt appears well and is in NAD  Neck: General: Supple without adenopathy. Thyroid: Right thyroid nodule appreciated and left inferior nodule appreciated  R> L   Lungs: Clear with good BS bilat with no rales, rhonchi, or wheezes  Heart: Auscultation: RRR.  Abdomen: Normoactive bowel sounds, soft, nontender, without masses or organomegaly palpable  Extremities:  BL LE: No pretibial edema normal ROM and strength.  Skin: Hair: Texture and amount normal with gender appropriate distribution Skin Inspection: No rashes Skin Palpation: Skin temperature, texture, and thickness normal to palpation  Mental Status: Judgment, insight: Intact Orientation: Oriented to time, place, and person Mood and affect: No depression, anxiety, or agitation     DATA REVIEWED:     Latest Reference Range & Units 11/09/21 10:40  TSH 0.35 - 5.50 uIU/mL 1.62  T4,Free(Direct) 0.60 - 1.60 ng/dL 0.85      Thyroid Ultrasound 08/26/2021 Estimated total number of nodules >/= 1 cm: 5   Number of spongiform nodules >/=  2 cm not described below (TR1): 0   Number of mixed cystic and solid nodules >/= 1.5 cm not described below ([place]): 0   _________________________________________________________   Nodule # 1:   Location: Isthmus; right   Maximum size: 1.3 cm; Other 2 dimensions: 1.2 x 0.7 cm   Composition: solid/almost completely solid (2)   Echogenicity: isoechoic (1)   Shape: not taller-than-wide (0)   Margins: ill-defined (0)   Echogenic foci: peripheral calcifications (2)   ACR TI-RADS total points: 5.   ACR TI-RADS risk category: TR4 (4-6 points).   ACR TI-RADS recommendations:   *Given size  (>/= 1 - 1.4 cm) and appearance, a follow-up ultrasound in 1 year should be considered based on TI-RADS criteria.   _________________________________________________________   Nodule # 3:   Location: Right ; Inferior   Maximum size: 2.5 cm; Other 2 dimensions: 2.5 x 2.1 cm   Composition: solid/almost completely solid (2)   Echogenicity: isoechoic (1)   Shape: not taller-than-wide (0)   Margins: ill-defined (0)   Echogenic foci: punctate echogenic foci (3)   ACR TI-RADS total points: 6.   ACR TI-RADS risk category: TR4 (4-6 points).   ACR TI-RADS recommendations:   **Given size (>/= 1.5 cm) and appearance, fine needle aspiration of this moderately suspicious nodule should be considered based on TI-RADS criteria.   _________________________________________________________   There are 3 additional left thyroid cystic and isoechoic nodules noted, all measuring 1.3 cm or less. These would not meet criteria for any follow-up or biopsy are not fully described by TI rads criteria.   Nonspecific thyroid heterogeneity. No hypervascularity. No regional adenopathy.   IMPRESSION: 2.5 cm left inferior thyroid TR 4 nodule meets criteria for biopsy as above. This correlates with the CTA finding.   ASSESSMENT/PLAN/RECOMMENDATIONS:   Multinodular Goiter:   - NO local neck symptoms  - Pt with night sweats and diarrhea , TFT's are normal  - Will proceed with FNA of the right inferior 2.5 cm nodule , of note, the report is dictated with a left inferior  note meeting FNA criteria  but in review of images, the pt has a right inferior nodule that meets criteria for FNA. I have left a message to the reading provider  on 11/09/2021 at 1230 .    F/ in 1 yr    Signed electronically by: Mack Guise, MD  [facility]  [address] [place],  [address] Phone: [phone] FAX: [phone]   CC: [name],  [address] [place] [address] Phone: [phone] Fax: [phone]   Return to Endocrinology clinic as below: Future Appointments  Date Time Provider [place]  11/19/2021  1:40 PM [facility] MM 2 [facility]  12/17/2021 10:30 AM [name], MD [facility]  01/11/2022 11:40 AM [place], Peter M, MD [facility]

## 2021-11-10 ENCOUNTER — Other Ambulatory Visit: Payer: Self-pay | Admitting: Cardiology

## 2021-11-12 ENCOUNTER — Telehealth (INDEPENDENT_AMBULATORY_CARE_PROVIDER_SITE_OTHER): Payer: Medicare Other | Admitting: Family Medicine

## 2021-11-12 ENCOUNTER — Encounter: Payer: Self-pay | Admitting: Family Medicine

## 2021-11-12 ENCOUNTER — Telehealth: Payer: Self-pay

## 2021-11-12 VITALS — Temp 98.6°F | Wt 121.0 lb

## 2021-11-12 DIAGNOSIS — U071 COVID-19: Secondary | ICD-10-CM | POA: Diagnosis not present

## 2021-11-12 MED ORDER — MOLNUPIRAVIR EUA 200MG CAPSULE: 4.0000 | ORAL_CAPSULE | Freq: Two times a day (BID) | ORAL | 0 refills | Status: AC

## 2021-11-12 NOTE — Telephone Encounter (Signed)
Spoke with patient, gave a verbal understanding. Was very appreciative of me explaining everything to her again, states she was told there was no medications used for covid, I reassured patient and she is going to keep her appointment with Dr.Kim, will call us back if she has any additional questions after seeing Dr.Kim

## 2021-11-12 NOTE — Telephone Encounter (Signed)
Please advise, I am aware of an infusion not an injection.. patient does not want to speak to me requests to speak directly to you.

## 2021-11-12 NOTE — Progress Notes (Signed)
Virtual Visit via Video Note  I connected with Zlaty  on 11/12/21 at  4:40 PM EST by a video enabled telemedicine application and verified that I am speaking with the correct person using two identifiers.  Location patient: home, [place] Location provider:work or home office Persons participating in the virtual visit: patient, provider  I discussed the limitations of evaluation and management by telemedicine and the availability of in person appointments. The patient expressed understanding and agreed to proceed.   HPI:  Acute telemedicine visit for Covid19: -Onset: started yesterday; positive covid test this morning -husband has covid -Symptoms include: sore throat, low grade temperature, diarrhea -Denies:Cp, SOB, vomiting, inability to eat/drink -Pertinent past medical history: see below -Pertinent medication allergies:  Allergies  Allergen Reactions   Brilinta [Ticagrelor] Shortness Of Breath   Bextra [Valdecoxib] Other (See Comments)    Increased lft's    Hydrocod Polst-Cpm Polst Er Rash   Lipitor [Atorvastatin] Other (See Comments)    Leg weakness    Mevacor [Lovastatin] Other (See Comments)    Muscle weakness    Plavix [Clopidogrel Bisulfate] Itching and Other (See Comments)    Hands and feet tingle and itch   Zocor [Simvastatin] Other (See Comments)    Bones hurt    Doxycycline Diarrhea and Nausea And Vomiting   Metoprolol Other (See Comments)    Hair loss   Morphine And Related Itching and Nausea Only   Codeine Itching and Rash    Hyper-active   Lisinopril Cough  -COVID-19 vaccine status:  Immunization History  Administered Date(s) Administered   Fluad Quad(high Dose 65+) 07/30/2019   Influenza, High Dose Seasonal PF 08/13/2017, 08/26/2018, 09/15/2020   Influenza, Seasonal, Injecte, Preservative Fre 09/21/2014, 09/06/2015, 07/21/2016   Influenza,inj,quad, With Preservative 08/06/2017   Influenza-Unspecified 08/10/2017, 10/13/2021   PFIZER(Purple Top)SARS-COV-2  Vaccination 12/22/2019, 01/12/2020, 09/15/2020   PPD Test 09/13/1979, 03/10/2021   Pfizer Covid-19 Vaccine Bivalent Booster 83yrs & up 10/13/2021   Pneumococcal Conjugate-13 12/31/2016   Pneumococcal Polysaccharide-23 12/16/2014, 08/01/2019   Td 05/13/2008   Tdap 08/09/2012   Zoster Recombinat (Shingrix) 01/13/2018, 03/14/2018   Zoster, Live 11/03/2010    ROS: See pertinent positives and negatives per HPI.  Past Medical History:  Diagnosis Date   AAA (abdominal aortic aneurysm)    a. s/p stent grafting in 2009.   Adenomatous colon polyp 05/2001   Adenomatous duodenal polyp    Allergy    Anginal pain (HCC)    COPD (chronic obstructive pulmonary disease) ([place])    pt denies COPD   Coronary artery disease    a. s/p CABG in 1995;  b. 05/2013 Neg MV, EF 82%;  c. 06/2013 Cath: LM nl, LAD 40-50p, LCX nl, RCA 80p/122m, VG->RCA->PDA->PLSA min irregs, LIMA->LAD atretic, vigorous LV fxn.   Diabetes mellitus without complication (HCC)    history of, resolved. 2017   Diverticulosis    Eczema, dyshidrotic    Eczema, dyshidrotic 1986   Dr. Mathis Fare   Emphysema of lung [facility])    Fatty liver    Fibromyalgia    GERD (gastroesophageal reflux disease)    Hyperlipidemia    Hypertension    Migraine    Myocardial infarction [facility])    Osteoarthritis    Osteoporosis    unsure, having a bone scan soon   Peripheral arterial disease ([place])    left leg diagnosed by Dr Mare Ferrari   Renal artery stenosis [facility])    a. 2008 s/p PTA    Past Surgical History:  Procedure Laterality Date  ABDOMINAL AORTAGRAM N/A 03/06/2014   Procedure: ABDOMINAL AORTAGRAM;  Surgeon: Rosetta Posner, MD;  Location: [facility] CATH LAB;  Service: Cardiovascular;  Laterality: N/A;   ABDOMINAL AORTIC ANEURYSM REPAIR  2009   stenting   ABDOMINAL HYSTERECTOMY     Total, 1992   BREAST BIOPSY     CARDIAC CATHETERIZATION     CARDIOVASCULAR STRESS TEST  11/11/2009   normal study   CARPAL TUNNEL RELEASE  08/1993   left wrist   CARPAL  TUNNEL RELEASE  01/1997   right   CATARACT EXTRACTION, BILATERAL  2021   CORONARY ARTERY BYPASS GRAFT  07/1994   Triple by Dr Ceasar Mons   CORONARY STENT INTERVENTION N/A 02/02/2021   Procedure: CORONARY STENT INTERVENTION;  Surgeon: Nelva Bush, MD;  Location: [facility] CV LAB;  Service: Cardiovascular;  Laterality: N/A;   CORONARY STENT INTERVENTION N/A 06/26/2021   Procedure: CORONARY STENT INTERVENTION;  Surgeon: Sherren Mocha, MD;  Location: [place] CV LAB;  Service: Cardiovascular;  Laterality: N/A;   dental implants     EYE SURGERY  03/2020   cataract   HAND SURGERY Right 09/2002   Right hand pulley release   INTRAVASCULAR ULTRASOUND/IVUS N/A 06/26/2021   Procedure: Intravascular Ultrasound/IVUS;  Surgeon: Sherren Mocha, MD;  Location: [place] CV LAB;  Service: Cardiovascular;  Laterality: N/A;   INTRAVASCULAR ULTRASOUND/IVUS N/A 09/03/2021   Procedure: Intravascular Ultrasound/IVUS;  Surgeon: Early Osmond, MD;  Location: [place] CV LAB;  Service: Cardiovascular;  Laterality: N/A;   LEFT HEART CATH AND CORONARY ANGIOGRAPHY N/A 06/26/2021   Procedure: LEFT HEART CATH AND CORONARY ANGIOGRAPHY;  Surgeon: Sherren Mocha, MD;  Location: [place] CV LAB;  Service: Cardiovascular;  Laterality: N/A;   LEFT HEART CATH AND CORS/GRAFTS ANGIOGRAPHY N/A 02/02/2021   Procedure: LEFT HEART CATH AND CORS/GRAFTS ANGIOGRAPHY;  Surgeon: Nelva Bush, MD;  Location: [place] CV LAB;  Service: Cardiovascular;  Laterality: N/A;   LEFT HEART CATH AND CORS/GRAFTS ANGIOGRAPHY N/A 09/03/2021   Procedure: LEFT HEART CATH AND CORS/GRAFTS ANGIOGRAPHY;  Surgeon: Early Osmond, MD;  Location: [place] CV LAB;  Service: Cardiovascular;  Laterality: N/A;   LEFT HEART CATHETERIZATION WITH CORONARY/GRAFT ANGIOGRAM N/A 06/11/2013   Procedure: LEFT HEART CATHETERIZATION WITH [name];  Surgeon: Thayer Headings, MD;  Location: [facility] CATH LAB;  Service: Cardiovascular;   Laterality: N/A;   MULTIPLE TOOTH EXTRACTIONS  2000   All upper teeth.  Has full dneture.    RENAL ARTERY STENT  2008   SHOULDER ARTHROSCOPY WITH ROTATOR CUFF REPAIR Right 09/27/2018   Procedure: Right shoulder mini open rotator cuff repair;  Surgeon: Susa Day, MD;  Location: [facility] ORS;  Service: Orthopedics;  Laterality: Right;  90 mins   thyroid cyst apiration  05/1997 and 07/1998   TONSILECTOMY, ADENOIDECTOMY, BILATERAL MYRINGOTOMY AND TUBES  1989   TONSILLECTOMY     TOTAL ABDOMINAL HYSTERECTOMY  1992     Current Outpatient Medications:    Accu-Chek FastClix Lancets MISC, Check blood sugar once daily PRN, Disp: 100 each, Rfl: 1   ALPRAZolam (XANAX) 0.25 MG tablet, Take 1 tablet (0.25 mg total) by mouth at bedtime as needed for sleep., Disp: 90 tablet, Rfl: 3   aspirin EC 81 MG tablet, Take 1 tablet (81 mg total) by mouth daily., Disp: 90 tablet, Rfl: 3   atorvastatin (LIPITOR) 80 MG tablet, TAKE 1 TABLET BY MOUTH EVERY DAY, Disp: 90 tablet, Rfl: 3   B Complex-Biotin-FA (B-100 COMPLEX PO), Take 1 tablet by mouth  every evening., Disp: , Rfl:    bismuth subsalicylate (PEPTO BISMOL) 262 MG/15ML suspension, Take 30 mLs by mouth every 6 (six) hours as needed., Disp: , Rfl:    Budeson-Glycopyrrol-Formoterol (BREZTRI AEROSPHERE) 160-9-4.8 MCG/ACT AERO, Inhale 2 puffs into the lungs in the morning and at bedtime., Disp: 10.7 g, Rfl: 5   cetirizine (ZYRTEC) 10 MG tablet, Take 10 mg by mouth daily as needed for allergies., Disp: , Rfl:    cholecalciferol (VITAMIN D3) 25 MCG (1000 UNIT) tablet, Take 1,000 Units by mouth every evening., Disp: , Rfl:    CORDRAN 4 MCG/SQCM TAPE, Apply 1 each topically daily as needed (Rash). , Disp: , Rfl:    cycloSPORINE (RESTASIS) 0.05 % ophthalmic emulsion, Place 1 drop into both eyes 2 (two) times daily. , Disp: , Rfl:    gabapentin (NEURONTIN) 300 MG capsule, Take 1 capsule (300 mg total) by mouth 3 (three) times daily., Disp: 270 capsule, Rfl: 3   glucose blood  (ACCU-CHEK SMARTVIEW) test strip, CHECK BLOOD SUGAR ONCE DAILY AS NEEDED, Disp: 100 strip, Rfl: 1   isosorbide mononitrate (IMDUR) 30 MG 24 hr tablet, Take 3 tablets ( 90 mg ) every morning, Disp: 270 tablet, Rfl: 3   metoprolol tartrate (LOPRESSOR) 25 MG tablet, TAKE 1 TABLET BY MOUTH TWICE A DAY, Disp: 180 tablet, Rfl: 1   molnupiravir EUA (LAGEVRIO) 200 mg CAPS capsule, Take 4 capsules (800 mg total) by mouth 2 (two) times daily for 5 days., Disp: 40 capsule, Rfl: 0   Multiple Vitamins-Minerals (MULTIVITAMIN WITH MINERALS) tablet, Take 1 tablet by mouth every evening., Disp: , Rfl:    nitroGLYCERIN (NITROSTAT) 0.4 MG SL tablet, Place 1 tablet (0.4 mg total) under the tongue every 5 (five) minutes as needed for chest pain., Disp: 25 tablet, Rfl: 11   omeprazole (PRILOSEC) 40 MG capsule, Take 1 capsule (40 mg total) by mouth in the morning and at bedtime., Disp: 180 capsule, Rfl: 3   oxyCODONE-acetaminophen (PERCOCET) 10-325 MG tablet, Take 1 tablet by mouth See admin instructions. Take 1 tablet twice daily, may take a third tablet as needed for pain, Disp: , Rfl:    prasugrel (EFFIENT) 5 MG TABS tablet, Take 5 mg by mouth daily., Disp: , Rfl:    PRESCRIPTION MEDICATION, Place 2 puffs into both ears daily as needed (ear infections). CSAHC ear powder, Disp: , Rfl:    Simethicone (GAS-X PO), Take 1-2 tablets by mouth daily as needed (gas)., Disp: , Rfl:    sodium chloride (OCEAN) 0.65 % SOLN nasal spray, Place 1 spray into both nostrils as needed for congestion., Disp: , Rfl:    tretinoin (RETIN-A) 0.1 % cream, Apply 1 application topically every other day. In the evening, Disp: , Rfl:    hydrochlorothiazide (HYDRODIURIL) 12.5 MG tablet, Take 1 tablet (12.5 mg total) by mouth daily., Disp: 90 tablet, Rfl: 3   losartan (COZAAR) 50 MG tablet, Take 1 tablet (50 mg total) by mouth daily., Disp: 90 tablet, Rfl: 2  EXAM:  VITALS per patient if applicable:  GENERAL: alert, oriented, appears well and in  no acute distress  HEENT: atraumatic, conjunttiva clear, no obvious abnormalities on inspection of external nose and ears  NECK: normal movements of the head and neck  LUNGS: on inspection no signs of respiratory distress, breathing rate appears normal, no obvious gross SOB, gasping or wheezing  CV: no obvious cyanosis  MS: moves all visible extremities without noticeable abnormality  PSYCH/NEURO: pleasant and cooperative, no obvious depression or anxiety,  speech and thought processing grossly intact  ASSESSMENT AND PLAN:  Discussed the following assessment and plan:  COVID-19   Discussed treatment options and risk of drug interactions, ideal treatment window, potential complications, isolation and precautions for COVID-19.  Discussed possibility of rebound with antivirals and the need to reisolate if it should occur for 5 days.After lengthy discussion, the patient opted for treatment with Legevrio due to being higher risk for complications of covid or severe disease and other factors. Discussed EUA status of this drug and the fact that there is preliminary limited knowledge of risks/interactions/side effects per EUA document vs possible benefits and precautions. This information was shared with patient during the visit and also was provided in patient instructions. Also, advised that patient discuss risks/interactions and use with pharmacist/treatment team as well. Symptomatic care measures summarized in patient instructions.  Advised to seek prompt vv or in person care if worsening, new symptoms arise, or if is not improving with treatment. Discussed options for inperson care if PCP office not available. Did let this patient know that I  do telemedicine on Tuesdays and Thursdays for [place].  I discussed the assessment and treatment plan with the patient. The patient was provided an opportunity to ask questions and all were answered. The patient agreed with the plan and demonstrated an  understanding of the instructions.     Lucretia Kern, DO

## 2021-11-12 NOTE — Patient Instructions (Signed)
HOME CARE TIPS:   -I sent the medication(s) we discussed to your pharmacy: Meds ordered this encounter  Medications   molnupiravir EUA (LAGEVRIO) 200 mg CAPS capsule    Sig: Take 4 capsules (800 mg total) by mouth 2 (two) times daily for 5 days.    Dispense:  40 capsule    Refill:  0     -I sent in the [facility] treatment or referral you requested per our discussion. Please see the information provided below and discuss further with the pharmacist/treatment team.   -there is a chance of rebound illness after finishing your treatment. If you become sick again please isolate for an additional 5 days, plus 5 more days of masking.   -can use tylenol  if needed for fevers, aches and pains per instructions  -can use nasal saline a few times per day if you have nasal congestion   -stay hydrated, drink plenty of fluids and eat small healthy meals - avoid dairy  -follow up with your doctor in 2-3 days unless improving and feeling better  -stay home while sick, except to seek medical care. If you have COVID19, you will likely be contagious for 7-10 days. Flu or Influenza is likely contagious for about 7 days. Other respiratory viral infections remain contagious for 5-10+ days depending on the virus and many other factors. Wear a good mask that fits snugly (such as N95 or KN95) if around others to reduce the risk of transmission.  It was nice to meet you today, and I really hope you are feeling better soon. I help [facility] out with telemedicine visits on Tuesdays and Thursdays and am happy to help if you need a follow up virtual visit on those days. Otherwise, if you have any concerns or questions following this visit please schedule a follow up visit with your Primary Care doctor or seek care at a local urgent care clinic to avoid delays in care.    Seek in person care or schedule a follow up video visit promptly if your symptoms worsen, new concerns arise or you are not improving with  treatment. Call 911 and/or seek emergency care if your symptoms are severe or life threatening.      Fact Sheet for Patients And Caregivers Emergency Use Authorization (EUA) Of LAGEVRIOT (molnupiravir) capsules For Coronavirus Disease 2019 (COVID-19)  What is the most important information I should know about LAGEVRIO? LAGEVRIO may cause serious side effects, including: ? LAGEVRIO may cause harm to your unborn baby. It is not known if LAGEVRIO will harm your baby if you take LAGEVRIO during pregnancy. o LAGEVRIO is not recommended for use in pregnancy. o LAGEVRIO has not been studied in pregnancy. LAGEVRIO was studied in pregnant animals only. When LAGEVRIO was given to pregnant animals, LAGEVRIO caused harm to their unborn babies. o You and your healthcare provider may decide that you should take LAGEVRIO during pregnancy if there are no other COVID-19 treatment options approved or authorized by the FDA that are accessible or clinically appropriate for you. o If you and your healthcare provider decide that you should take LAGEVRIO during pregnancy, you and your healthcare provider should discuss the known and potential benefits and the potential risks of taking LAGEVRIO during pregnancy. For individuals who are able to become pregnant: ? You should use a reliable method of birth control (contraception) consistently and correctly during treatment with LAGEVRIO and for 4 days after the last dose of LAGEVRIO. Talk to your healthcare provider about reliable birth control  methods. ? Before starting treatment with [facility] your healthcare provider may do a pregnancy test to see if you are pregnant before starting treatment with LAGEVRIO. ? Tell your healthcare provider right away if you become pregnant or think you may be pregnant during treatment with LAGEVRIO. Pregnancy Surveillance Program: ? There is a pregnancy surveillance program for individuals who take LAGEVRIO during pregnancy.  The purpose of this program is to collect information about the health of you and your baby. Talk to your healthcare provider about how to take part in this program. ? If you take LAGEVRIO during pregnancy and you agree to participate in the pregnancy surveillance program and allow your healthcare provider to share your information with [place], then your healthcare provider will report your use of [place] during pregnancy to [facility]. by calling [phone] or [email or web address]. For individuals who are sexually active with partners who are able to become pregnant: ? It is not known if LAGEVRIO can affect sperm. While the risk is regarded as low, animal studies to fully assess the potential for LAGEVRIO to affect the babies of males treated with LAGEVRIO have not been completed. A reliable method of birth control (contraception) should be used consistently and correctly during treatment with LAGEVRIO and for at least 3 months after the last dose. The risk to sperm beyond 3 months is not known. Studies to understand the risk to sperm beyond 3 months are ongoing. Talk to your healthcare provider about reliable birth control methods. Talk to your healthcare provider if you have questions or concerns about how LAGEVRIO may affect sperm. You are being given this fact sheet because your healthcare provider believes it is necessary to provide you with LAGEVRIO for the treatment of adults with mild-to-moderate coronavirus disease 2019 (COVID-19) with positive results of direct SARS-CoV-2 viral testing, and who are at high risk for progression to severe COVID-19 including hospitalization or death, and for whom other COVID-19 treatment options approved or authorized by the FDA are not accessible or clinically appropriate. The U.S. Food and Drug Administration (FDA) has issued an Emergency Use Authorization (EUA) to make LAGEVRIO available during the COVID-19  pandemic (for more details about an EUA please see "What is an Emergency Use Authorization?" at the end of this document). LAGEVRIO is not an FDA-approved medicine in the [place]. Read this Fact Sheet for information about LAGEVRIO. Talk to your healthcare provider about your options if you have any questions. It is your choice to take LAGEVRIO.  What is COVID-19? COVID-19 is caused by a virus called a coronavirus. You can get COVID-19 through close contact with another person who has the virus. COVID-19 illnesses have ranged from very mild-to-severe, including illness resulting in death. While information so far suggests that most COVID-19 illness is mild, serious illness can happen and may cause some of your other medical conditions to become worse. Older people and people of all ages with severe, long lasting (chronic) medical conditions like heart disease, lung disease and diabetes, for example seem to be at higher risk of being hospitalized for COVID-19.  What is LAGEVRIO? LAGEVRIO is an investigational medicine used to treat mild-to-moderate COVID-19 in adults: ? with positive results of direct SARS-CoV-2 viral testing, and ? who are at high risk for progression to severe COVID-19 including hospitalization or death, and for whom other COVID-19 treatment options approved or authorized by the FDA are not accessible or clinically appropriate. The FDA has authorized the emergency use of  LAGEVRIO for the treatment of mild-tomoderate COVID-19 in adults under an EUA. For more information on EUA, see the "What is an Emergency Use Authorization (EUA)?" section at the end of this Fact Sheet. LAGEVRIO is not authorized: ? for use in people less than 26 years of age. ? for prevention of COVID-19. ? for people needing hospitalization for COVID-19. ? for use for longer than 5 consecutive days.  What should I tell my healthcare provider before I take LAGEVRIO? Tell your healthcare  provider if you: ? Have any allergies ? Are breastfeeding or plan to breastfeed ? Have any serious illnesses ? Are taking any medicines (prescription, over-the-counter, vitamins, or herbal products).  How do I take LAGEVRIO? ? Take LAGEVRIO exactly as your healthcare provider tells you to take it. ? Take 4 capsules of LAGEVRIO every 12 hours (for example, at 8 am and at 8 pm) ? Take LAGEVRIO for 5 days. It is important that you complete the full 5 days of treatment with LAGEVRIO. Do not stop taking LAGEVRIO before you complete the full 5 days of treatment, even if you feel better. ? Take LAGEVRIO with or without food. ? You should stay in isolation for as long as your healthcare provider tells you to. Talk to your healthcare provider if you are not sure about how to properly isolate while you have COVID-19. ? Swallow LAGEVRIO capsules whole. Do not open, break, or crush the capsules. If you cannot swallow capsules whole, tell your healthcare provider. ? What to do if you miss a dose: o If it has been less than 10 hours since the missed dose, take it as soon as you remember o If it has been more than 10 hours since the missed dose, skip the missed dose and take your dose at the next scheduled time. ? Do not double the dose of LAGEVRIO to make up for a missed dose.  What are the important possible side effects of LAGEVRIO? ? See, "What is the most important information I should know about LAGEVRIO?" ? Allergic Reactions. Allergic reactions can happen in people taking LAGEVRIO, even after only 1 dose. Stop taking LAGEVRIO and call your healthcare provider right away if you get any of the following symptoms of an allergic reaction: o hives o rapid heartbeat o trouble swallowing or breathing o swelling of the mouth, lips, or face o throat tightness o hoarseness o skin rash The most common side effects of LAGEVRIO are: ? diarrhea ? nausea ? dizziness These are not all the possible  side effects of LAGEVRIO. Not many people have taken LAGEVRIO. Serious and unexpected side effects may happen. This medicine is still being studied, so it is possible that all of the risks are not known at this time.  What other treatment choices are there?  Veklury (remdesivir) is FDA-approved as an intravenous (IV) infusion for the treatment of mildto-moderate HMCNO-70 in certain adults and children. Talk with your doctor to see if [facility] is appropriate for you. Like LAGEVRIO, FDA may also allow for the emergency use of other medicines to treat people with COVID-19. Go to [email or web address].[email or web address] for more information. It is your choice to be treated or not to be treated with LAGEVRIO. Should you decide not to take it, it will not change your standard medical care.  What if I am breastfeeding? Breastfeeding is not recommended during treatment with LAGEVRIO and for 4 days after the last dose of LAGEVRIO. If you are breastfeeding or plan to breastfeed, talk to  your healthcare provider about your options and specific situation before taking LAGEVRIO.  How do I report side effects with LAGEVRIO? Contact your healthcare provider if you have any side effects that bother you or do not go away. Report side effects to FDA MedWatch at SmoothHits.hu or call 1-800-FDA-1088 (1- (540)202-9618).  How should I store [place]? ? Store LAGEVRIO capsules at room temperature between 59F to 42F (20C to 25C). ? Keep LAGEVRIO and all medicines out of the reach of children and pets. How can I learn more about COVID-19? ? Ask your healthcare provider. ? Visit SeekRooms.co.uk ? Contact your local or state public health department. ? Call [place] at [phone] (toll free in the [place] ? Visit [email or web address].com  What Is an Emergency Use Authorization (EUA)? The [place] FDA  has made [place] available under an emergency access mechanism called an Emergency Use Authorization (EUA) The EUA is supported by a Presenter, broadcasting Health and Human Service (HHS) declaration that circumstances exist to justify emergency use of drugs and biological products during the COVID-19 pandemic. LAGEVRIO for the treatment of mild-to-moderate COVID-19 in adults with positive results of direct SARS-CoV-2 viral testing, who are at high risk for progression to severe COVID-19, including hospitalization or death, and for whom alternative COVID-19 treatment options approved or authorized by FDA are not accessible or clinically appropriate, has not undergone the same type of review as an FDA-approved product. In issuing an EUA under the BBUYZ-70 public health emergency, the FDA has determined, among other things, that based on the total amount of scientific evidence available including data from adequate and well-controlled clinical trials, if available, it is reasonable to believe that the product may be effective for diagnosing, treating, or preventing COVID-19, or a serious or life-threatening disease or condition caused by COVID-19; that the known and potential benefits of the product, when used to diagnose, treat, or prevent such disease or condition, outweigh the known and potential risks of such product; and that there are no adequate, approved, and available alternatives.  All of these criteria must be met to allow for the product to be used in the treatment of patients during the COVID-19 pandemic. The EUA for LAGEVRIO is in effect for the duration of the COVID-19 declaration justifying emergency use of LAGEVRIO, unless terminated or revoked (after which LAGEVRIO may no longer be used under the EUA). For patent information: http://rogers.info/ Copyright  2021-2022 [employer] [place], [place] and its affiliates. All rights reserved. [id number] Revised:  March 2022

## 2021-11-12 NOTE — Telephone Encounter (Signed)
Pt called stating that she is covid positive. Pt wants to come in the office and see Dr Jonni Sanger. I informed pt that we will have to start with a virtual visit to see what the provider would advise. Pt stated that she doesn't think that virtual will do any good because she needs the covid injection. Pt stated that she wants a call back from Dr Jonni Sanger personally to discuss the issue. I offered a virtual with Dr [name] so that she can go ahead and be evaluated. Pt stated that she will do the visit only if Dr Jonni Sanger cannot speak to her prior. Pt is currently scheduled with Dr [name]. Please Advise.

## 2021-11-18 ENCOUNTER — Encounter: Payer: Self-pay | Admitting: Cardiology

## 2021-11-18 ENCOUNTER — Other Ambulatory Visit: Payer: Self-pay

## 2021-11-18 MED ORDER — METOPROLOL TARTRATE 25 MG PO TABS: ORAL_TABLET | ORAL | 3 refills | Status: DC

## 2021-11-18 NOTE — Telephone Encounter (Signed)
Spoke to patient she stated she is taking Metoprolol 25 mg 1&1/2 tablets twice a day.Advised I will send a 90 day refill to her pharmacy.

## 2021-11-19 ENCOUNTER — Ambulatory Visit: Payer: Medicare Other

## 2021-12-09 ENCOUNTER — Ambulatory Visit
Admission: RE | Admit: 2021-12-09 | Discharge: 2021-12-09 | Disposition: A | Discharge status: Home / Self Care | Payer: Medicare Other | Source: Ambulatory Visit | Attending: Internal Medicine | Admitting: Internal Medicine

## 2021-12-09 ENCOUNTER — Other Ambulatory Visit (HOSPITAL_COMMUNITY)
Admission: RE | Admit: 2021-12-09 | Discharge: 2021-12-09 | Disposition: A | Discharge status: Home / Self Care | Payer: Medicare Other | Source: Ambulatory Visit | Attending: Internal Medicine | Admitting: Internal Medicine

## 2021-12-09 DIAGNOSIS — D440 Neoplasm of uncertain behavior of thyroid gland: Secondary | ICD-10-CM | POA: Diagnosis not present

## 2021-12-09 DIAGNOSIS — E041 Nontoxic single thyroid nodule: Secondary | ICD-10-CM | POA: Diagnosis not present

## 2021-12-09 DIAGNOSIS — E042 Nontoxic multinodular goiter: Secondary | ICD-10-CM

## 2021-12-10 LAB — CYTOLOGY - NON PAP

## 2021-12-12 DIAGNOSIS — E042 Nontoxic multinodular goiter: Secondary | ICD-10-CM | POA: Diagnosis not present

## 2021-12-13 ENCOUNTER — Encounter (HOSPITAL_BASED_OUTPATIENT_CLINIC_OR_DEPARTMENT_OTHER): Payer: Medicare Other | Admitting: Cardiovascular Disease

## 2021-12-14 ENCOUNTER — Encounter (HOSPITAL_BASED_OUTPATIENT_CLINIC_OR_DEPARTMENT_OTHER): Payer: Medicare Other | Admitting: Cardiovascular Disease

## 2021-12-17 ENCOUNTER — Ambulatory Visit (INDEPENDENT_AMBULATORY_CARE_PROVIDER_SITE_OTHER): Payer: Medicare Other | Admitting: Family Medicine

## 2021-12-17 ENCOUNTER — Encounter: Payer: Self-pay | Admitting: Family Medicine

## 2021-12-17 ENCOUNTER — Other Ambulatory Visit: Payer: Self-pay

## 2021-12-17 VITALS — BP 129/58 | HR 62 | Temp 97.6°F | Ht 60.0 in | Wt 123.4 lb

## 2021-12-17 DIAGNOSIS — I739 Peripheral vascular disease, unspecified: Secondary | ICD-10-CM | POA: Diagnosis not present

## 2021-12-17 DIAGNOSIS — R2681 Unsteadiness on feet: Secondary | ICD-10-CM

## 2021-12-17 DIAGNOSIS — Z9889 Other specified postprocedural states: Secondary | ICD-10-CM | POA: Diagnosis not present

## 2021-12-17 DIAGNOSIS — M48061 Spinal stenosis, lumbar region without neurogenic claudication: Secondary | ICD-10-CM

## 2021-12-17 DIAGNOSIS — M797 Fibromyalgia: Secondary | ICD-10-CM | POA: Diagnosis not present

## 2021-12-17 DIAGNOSIS — J449 Chronic obstructive pulmonary disease, unspecified: Secondary | ICD-10-CM

## 2021-12-17 DIAGNOSIS — I25708 Atherosclerosis of coronary artery bypass graft(s), unspecified, with other forms of angina pectoris: Secondary | ICD-10-CM | POA: Diagnosis not present

## 2021-12-17 DIAGNOSIS — R7301 Impaired fasting glucose: Secondary | ICD-10-CM | POA: Diagnosis not present

## 2021-12-17 NOTE — Progress Notes (Signed)
Subjective  CC:  Chief Complaint  Patient presents with   Hypertension    HPI: Megan [name] is a 76 y.o. female who presents to the office today to address the problems listed above in the chief complaint. Here for 103-month follow-up of chronic medical problems including hypertension, CAD, impaired fasting glucose, fibromyalgia and COPD.  Overall she had a good 6 months.  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  Her angina is improved on the higher dose of Imdur.  She has follow-up with cardiology soon.  She is compliant with all of her medications without side effects.  No current breathing issues. She has had a few falls due to her unsteady gait because of her neuropathic pain and radiculopathy symptoms.  Neurosurgery is following along.  She does use a cane and at times uses a walker.  Assessment  1. Coronary artery disease of bypass graft of native heart with stable angina pectoris ([place])   2. IFG (impaired fasting glucose)   3. Peripheral vascular disease with claudication ([place])   4. Fibromyalgia   5. COPD with chronic bronchitis ([facility]) Chronic  6. History of lumbosacral spine surgery   7. Lumbar stenosis without neurogenic claudication   8. Gait instability      Plan   Chronic medical problems are well controlled.  She will have lab work prior to her cardiology visit.  No changes in medications are indicated at this time. Fall prevention discussed.  Recommend cane use but would move up to a walker on a regular basis if needed.  She has high risk for compression fractures or hip fractures. Fibromyalgia is stable. I spent a total of 32 minutes for this patient encounter. Time spent included preparation, face-to-face counseling with the patient and coordination of care, review of chart and records, and documentation of the encounter.  Education regarding management of these chronic disease states was given. Management strategies  discussed on successive visits include dietary and exercise recommendations, goals of achieving and maintaining IBW, and lifestyle modifications aiming for adequate sleep and minimizing stressors.   Follow up: July for complete physical  No orders of the defined types were placed in this encounter.  No orders of the defined types were placed in this encounter.     BP Readings from Last 3 Encounters:  12/17/21 (!) 129/58  11/09/21 124/78  09/30/21 (!) 151/67   Wt Readings from Last 3 Encounters:  12/17/21 123 lb 6.4 oz (56 kg)  11/12/21 121 lb (54.9 kg)  11/09/21 124 lb (56.2 kg)    Lab Results  Component Value Date   CHOL 142 06/25/2021   CHOL 156 04/20/2021   CHOL 160 06/13/2020   Lab Results  Component Value Date   HDL 49 06/25/2021   HDL 59 04/20/2021   HDL 51.10 06/13/2020   Lab Results  Component Value Date   LDLCALC 73 06/25/2021   LDLCALC 73 04/20/2021   LDLCALC 64 01/01/2020   Lab Results  Component Value Date   TRIG 100 06/25/2021   TRIG 142 04/20/2021   TRIG 294.0 (H) 06/13/2020   Lab Results  Component Value Date   CHOLHDL 2.9 06/25/2021   CHOLHDL 2.6 04/20/2021   CHOLHDL 3 06/13/2020   Lab Results  Component Value Date   LDLDIRECT 82.0 06/13/2020   LDLDIRECT 122.0 01/25/2019   LDLDIRECT 87.4 12/09/2014   Lab Results  Component Value Date   CREATININE 0.76 09/04/2021   BUN 7 (  L) 09/04/2021   NA 140 09/04/2021   K 4.4 09/04/2021   CL 106 09/04/2021   CO2 28 09/04/2021    The ASCVD Risk score (Arnett DK, et al., 2019) failed to calculate for the following reasons:   The patient has a prior MI or stroke diagnosis  I reviewed the patients updated PMH, FH, and SocHx.    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    Priority: High   Coronary artery disease     Priority: High   Peripheral vascular disease with claudication ([place])     Priority: High   History of abdominal aortic aneurysm repair 05/18/2011    Priority: High   Hyperlipidemia LDL goal <70     Priority: High   History of compression fracture of spine - lumbar 06/19/2021    Priority: Medium    History of lumbosacral spine surgery 03/03/2021    Priority: Medium    Neuropathic pain 12/05/2020    Priority: Medium    Gait instability 09/09/2020    Priority: Medium    Lumbar stenosis without neurogenic claudication 02/26/2020    Priority: Medium    DJD (degenerative joint disease), lumbosacral 11/10/2017    Priority: Medium    Fibromyalgia 11/10/2017    Priority: Medium    GERD (gastroesophageal reflux disease) 11/10/2017    Priority: Medium    Fatty liver 11/10/2017    Priority: Medium    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    Priority: Medium    History of renal artery stenosis 05/18/2011    Priority: Medium    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/26/2021    Priority: Low   Dyshidrotic eczema 11/10/2017    Priority: Low   Osteopenia 11/10/2017    Priority: Low   Does use hearing aid 11/10/2017    Priority: Low   Thyroid nodule greater than or equal to 1.5 cm in diameter incidentally noted on imaging study 08/27/2021    Allergies: Brilinta [ticagrelor], Bextra [valdecoxib], Hydrocod polst-cpm polst er, Lipitor [atorvastatin], Mevacor [lovastatin], Plavix [clopidogrel bisulfate], Zocor [simvastatin], Doxycycline, Metoprolol, Morphine and related, Codeine, and Lisinopril  Social History: Patient  reports that she quit smoking about 26 years ago. Her smoking use included cigarettes. She has never used smokeless tobacco. She reports current alcohol use of about 6.0 - 10.0 standard drinks per week. She reports that she does  not use drugs.  Current Meds  Medication Sig   Accu-Chek FastClix Lancets MISC Check blood sugar once daily PRN   ALPRAZolam (XANAX) 0.25 MG tablet Take 1 tablet (0.25 mg total) by mouth at bedtime as needed for sleep.   aspirin EC 81 MG tablet Take 1 tablet (81 mg total) by mouth daily.   atorvastatin (LIPITOR) 80 MG tablet TAKE 1 TABLET BY MOUTH EVERY DAY   B Complex-Biotin-FA (B-100 COMPLEX PO) Take 1 tablet by mouth every evening.   bismuth subsalicylate (PEPTO BISMOL) 262 MG/15ML suspension Take 30 mLs by mouth every 6 (six) hours as needed.   Budeson-Glycopyrrol-Formoterol (BREZTRI AEROSPHERE) 160-9-4.8 MCG/ACT AERO Inhale 2 puffs into the lungs in the morning and at bedtime.   cetirizine (ZYRTEC) 10 MG tablet Take 10 mg  by mouth daily as needed for allergies.   cholecalciferol (VITAMIN D3) 25 MCG (1000 UNIT) tablet Take 1,000 Units by mouth every evening.   CORDRAN 4 MCG/SQCM TAPE Apply 1 each topically daily as needed (Rash).    cycloSPORINE (RESTASIS) 0.05 % ophthalmic emulsion Place 1 drop into both eyes 2 (two) times daily.    gabapentin (NEURONTIN) 300 MG capsule Take 1 capsule (300 mg total) by mouth 3 (three) times daily.   glucose blood (ACCU-CHEK SMARTVIEW) test strip CHECK BLOOD SUGAR ONCE DAILY AS NEEDED   isosorbide mononitrate (IMDUR) 30 MG 24 hr tablet Take 3 tablets ( 90 mg ) every morning   metoprolol tartrate (LOPRESSOR) 25 MG tablet Take 1&1/2 tablets twice a day   Multiple Vitamins-Minerals (MULTIVITAMIN WITH MINERALS) tablet Take 1 tablet by mouth every evening.   nitroGLYCERIN (NITROSTAT) 0.4 MG SL tablet Place 1 tablet (0.4 mg total) under the tongue every 5 (five) minutes as needed for chest pain.   omeprazole (PRILOSEC) 40 MG capsule Take 1 capsule (40 mg total) by mouth in the morning and at bedtime.   oxyCODONE-acetaminophen (PERCOCET) 10-325 MG tablet Take 1 tablet by mouth See admin instructions. Take 1 tablet twice daily, may take a third tablet as needed for  pain   prasugrel (EFFIENT) 5 MG TABS tablet Take 5 mg by mouth daily.   PRESCRIPTION MEDICATION Place 2 puffs into both ears daily as needed (ear infections). [facility]SA[facility] ear powder   Simethicone (GAS-X PO) Take 1-2 tablets by mouth daily as needed (gas).   sodium chloride (OCEAN) 0.65 % SOLN nasal spray Place 1 spray into both nostrils as needed for congestion.   tretinoin (RETIN-A) 0.1 % cream Apply 1 application topically every other day. In the evening    Review of Systems: Cardiovascular: negative for chest pain, palpitations, leg swelling, orthopnea Respiratory: negative for SOB, wheezing or persistent cough Gastrointestinal: negative for abdominal pain Genitourinary: negative for dysuria or gross hematuria  Objective  Vitals: BP (!) 129/58    Pulse 62    Temp 97.6 F (36.4 C) (Temporal)    Ht 5' (1.524 m)    Wt 123 lb 6.4 oz (56 kg)    SpO2 98%    BMI 24.10 kg/m  General: no acute distress, thin Psych:  Alert and oriented, normal mood and affect HEENT:  Normocephalic, atraumatic, supple neck  Cardiovascular:  RRR without murmur. no edema Respiratory:  Good breath sounds bilaterally, CTAB with normal respiratory effort nd alternatives for medications and treatment plan prescribed today were discussed, and the patient expressed understanding of the given instructions. Patient is instructed to call or message via MyChart if he/she has any questions or concerns regarding our treatment plan. No barriers to understanding were identified. We discussed Red Flag symptoms and signs in detail. Patient expressed understanding regarding what to do in case of urgent or emergency type symptoms.  Medication list was reconciled, printed and provided to the patient in AVS. Patient instructions and summary information was reviewed with the patient as documented in the AVS. This note was prepared with assistance of Dragon voice recognition software. Occasional wrong-word or sound-a-like substitutions may have  occurred due to the inherent limitations of voice recognition software  This visit occurred during the SARS-CoV-2 public health emergency.  Safety protocols were in place, including screening questions prior to the visit, additional usage of staff PPE, and extensive cleaning of exam room while observing appropriate contact time as indicated for disinfecting solutions.

## 2021-12-17 NOTE — Patient Instructions (Signed)
Please return in July for your annual complete physical; please come fasting.   If you have any questions or concerns, please don't hesitate to send me a message via MyChart or call the office at [phone]. Thank you for visiting with us today! It's our pleasure caring for you.  

## 2021-12-18 ENCOUNTER — Ambulatory Visit
Admission: RE | Admit: 2021-12-18 | Discharge: 2021-12-18 | Disposition: A | Discharge status: Home / Self Care | Payer: Medicare Other | Source: Ambulatory Visit | Attending: Family Medicine | Admitting: Family Medicine

## 2021-12-18 DIAGNOSIS — Z1231 Encounter for screening mammogram for malignant neoplasm of breast: Secondary | ICD-10-CM | POA: Diagnosis not present

## 2021-12-21 ENCOUNTER — Telehealth: Payer: Self-pay | Admitting: Internal Medicine

## 2021-12-21 NOTE — Telephone Encounter (Signed)
Please let the pt know that genetic testing of the thyroid sample did NOT reveal any thyroid cancer , which is great news.    No further intervention  at this time but make sure she keeps her December appointment for a recheck  on the the thyroid    Thanks

## 2021-12-21 NOTE — Telephone Encounter (Signed)
Patient notified and verbalized understanding. 

## 2021-12-22 ENCOUNTER — Encounter (HOSPITAL_COMMUNITY): Payer: Self-pay

## 2021-12-28 ENCOUNTER — Other Ambulatory Visit: Payer: Self-pay | Admitting: Neurological Surgery

## 2021-12-28 DIAGNOSIS — M48061 Spinal stenosis, lumbar region without neurogenic claudication: Secondary | ICD-10-CM

## 2022-01-06 ENCOUNTER — Telehealth: Payer: Self-pay | Admitting: Family Medicine

## 2022-01-06 ENCOUNTER — Other Ambulatory Visit: Payer: Self-pay

## 2022-01-06 DIAGNOSIS — E782 Mixed hyperlipidemia: Secondary | ICD-10-CM

## 2022-01-06 DIAGNOSIS — I25708 Atherosclerosis of coronary artery bypass graft(s), unspecified, with other forms of angina pectoris: Secondary | ICD-10-CM | POA: Diagnosis not present

## 2022-01-06 DIAGNOSIS — I119 Hypertensive heart disease without heart failure: Secondary | ICD-10-CM | POA: Diagnosis not present

## 2022-01-06 LAB — BASIC METABOLIC PANEL
BUN/Creatinine Ratio: 12 (ref 12–28)
BUN: 8 mg/dL (ref 8–27)
CO2: 28 mmol/L (ref 20–29)
Calcium: 10.1 mg/dL (ref 8.7–10.3)
Chloride: 103 mmol/L (ref 96–106)
Creatinine, Ser: 0.68 mg/dL (ref 0.57–1.00)
Glucose: 97 mg/dL (ref 70–99)
Potassium: 4.4 mmol/L (ref 3.5–5.2)
Sodium: 142 mmol/L (ref 134–144)
eGFR: 90 mL/min/{1.73_m2} (ref >59)

## 2022-01-06 LAB — HEPATIC FUNCTION PANEL
ALT: 16 IU/L (ref 0–32)
AST: 18 IU/L (ref 0–40)
Albumin: 3.9 g/dL (ref 3.7–4.7)
Alkaline Phosphatase: 87 IU/L (ref 44–121)
Bilirubin Total: 0.6 mg/dL (ref 0.0–1.2)
Bilirubin, Direct: 0.21 mg/dL (ref 0.00–0.40)
Total Protein: 5.7 g/dL — ABNORMAL LOW (ref 6.0–8.5)

## 2022-01-06 LAB — CBC WITH DIFFERENTIAL/PLATELET
Basophils Absolute: 0 10*3/uL (ref 0.0–0.2)
Basos: 1 %
EOS (ABSOLUTE): 0.2 10*3/uL (ref 0.0–0.4)
Eos: 3 %
Hematocrit: 42.8 % (ref 34.0–46.6)
Hemoglobin: 14.2 g/dL (ref 11.1–15.9)
Immature Grans (Abs): 0 10*3/uL (ref 0.0–0.1)
Immature Granulocytes: 0 %
Lymphocytes Absolute: 2 10*3/uL (ref 0.7–3.1)
Lymphs: 30 %
MCH: 29.3 pg (ref 26.6–33.0)
MCHC: 33.2 g/dL (ref 31.5–35.7)
MCV: 88 fL (ref 79–97)
Monocytes Absolute: 0.5 10*3/uL (ref 0.1–0.9)
Monocytes: 7 %
Neutrophils Absolute: 3.9 10*3/uL (ref 1.4–7.0)
Neutrophils: 59 %
Platelets: 214 10*3/uL (ref 150–450)
RBC: 4.84 x10E6/uL (ref 3.77–5.28)
RDW: 11.9 % (ref 11.7–15.4)
WBC: 6.6 10*3/uL (ref 3.4–10.8)

## 2022-01-06 LAB — LIPID PANEL
Chol/HDL Ratio: 2.8 ratio (ref 0.0–4.4)
Cholesterol, Total: 148 mg/dL (ref 100–199)
HDL: 52 mg/dL (ref >39)
LDL Chol Calc (NIH): 74 mg/dL (ref 0–99)
Triglycerides: 125 mg/dL (ref 0–149)
VLDL Cholesterol Cal: 22 mg/dL (ref 5–40)

## 2022-01-06 LAB — HEMOGLOBIN A1C
Est. average glucose Bld gHb Est-mCnc: 120 mg/dL
Hgb A1c MFr Bld: 5.8 % — ABNORMAL HIGH (ref 4.8–5.6)

## 2022-01-06 NOTE — Chronic Care Management (AMB) (Signed)
°  Chronic Care Management   Outreach Note  01/06/2022 Name: Megan [name] MRN: [id number] DOB: 1946-03-26  Referred by: [name], MD Reason for referral : No chief complaint on file.   An unsuccessful telephone outreach was attempted today. The patient was referred to the pharmacist for assistance with care management and care coordination.   Follow Up Plan:   Tatjana Dellinger Upstream Scheduler

## 2022-01-07 ENCOUNTER — Other Ambulatory Visit: Payer: Self-pay

## 2022-01-07 DIAGNOSIS — M461 Sacroiliitis, not elsewhere classified: Secondary | ICD-10-CM | POA: Diagnosis not present

## 2022-01-07 DIAGNOSIS — E785 Hyperlipidemia, unspecified: Secondary | ICD-10-CM

## 2022-01-07 DIAGNOSIS — Z9889 Other specified postprocedural states: Secondary | ICD-10-CM | POA: Diagnosis not present

## 2022-01-07 DIAGNOSIS — R2681 Unsteadiness on feet: Secondary | ICD-10-CM | POA: Diagnosis not present

## 2022-01-07 DIAGNOSIS — I25708 Atherosclerosis of coronary artery bypass graft(s), unspecified, with other forms of angina pectoris: Secondary | ICD-10-CM

## 2022-01-07 MED ORDER — EZETIMIBE 10 MG PO TABS: 10.0000 mg | ORAL_TABLET | Freq: Every day | ORAL | 3 refills | Status: DC

## 2022-01-07 NOTE — Progress Notes (Signed)
zetia 

## 2022-01-09 NOTE — Progress Notes (Signed)
Cardiology Office Note   Date:  01/11/2022   ID:  Megan [name], Megan [name] 12/04/1946, MRN [id number]  PCP:  [name], MD  Cardiologist: Arrion Broaddus Martinique MD  Chief Complaint  Patient presents with   Coronary Artery Disease        History of Present Illness: Megan [name] is a 76 y.o. female who is seen for follow up CAD.  She has a history  of CAD, HTN, HL, and PAD.   She is status post coronary artery bypass graft surgery in 1995- Dr Servando Snare. She had a renal artery stent for renal artery stenosis in 2008. She had a stent graft of an abdominal aortic aneurysm 2009. She is s/p stenting of the left iliac. She is followed by [name] for PAD. Last CT in May 2018. She had [place] in June 2019 showing no endoleak.    She was hospitalized in July 2014 and had cardiac catheterization which showed that her grafts were open. EF normal.  She was hospitalized 11/15/2014 with which he describes as an aching sensation in her chest.    She underwent a treadmill Myoview on 11/17/14 which showed no ischemia and her ejection fraction was 86%.   In June 2019 she had Abdominal US at [facility] showing AAA of 4 cm.   In October 2019 she underwent right rotator cuff repair. No complications.   She was  having progressive issues with her low back that limited her activity and caused pain. She has exhausted conservative measures and underwent lumbar fusion in July by Dr Ronnald Ramp.   She was in the hospital  1/31 and discharged on 2/8- for AKI, diarrhea- diverticulitis (sigmoid colon), metabolic encephalopathy, UTI. Echo at that time was OK with normal EF and moderate TR.   She was re- admitted 2/26-02/03/21 with NSTEMI. Troponin up to 1085. Ecg showed significant inferolateral TWA. She underwent cardiac cath demonstrating patent LIMA to the LAD. The native RCA was occluded and she had severe sequential lesions in the SVG to RCA. This was treated with overlapping DES x 2- 3.0 x 26 and 3.5 x 34 mm Onyx stents.  Procedure  was complicated by a left forearm hematoma. She was treated with DAPT with ASA and Brilinta. On metoprolol and high dose statin.   She was admitted for overnight observation on 02/11/21 after presenting with a localized ache in her left chest. This was different than when she presented with her NSTEMI. Ecg showed no new changes and troponin was negative x 3  On her last visit she complained of increased SOB- this was felt to be related to [place]. We switched to Plavix but she had an allergic reaction with rash and hives. Later switched to Prasugrel- reduced dose based on age and female.    She presented to the ED on 7/21 with complaints of chest pain over the past 2 days. Developed an aching sensation in the left side of her chest on Tuesday afternoon. Tried a SL nitro without relief. Also tried several reflux medications without much either. Symptoms lingered into the night and next morning she woke up with ongoing symptoms.  She underwent repeat cardiac cath demonstrating restenosis of the SVG to RCA with clear evidence of prior stent fracture. She underwent repeat PCI with 3.5 x 26 mm Onyx stent post dilated to 4.0 with [place] balloon using IVUS guidance. Of note there were no collaterals to the distal RCA. There was significant aortic calcification. On July 28 she complained of  some recurrent chest pain and her Imdur was increased. When seen in office on August 17 she was better. A sleep study ordered due to daytime somnolence.   She was seen in the ED on 08/27/21 with recurrent chest pain. Ecg showed no changes and troponins were negative. Reports pain the same as before. In discussion with cardiology fellow she was DC from ED with increased dose of Imdur to 90 mg daily and metoprolol to 37.5 mg bid. She was concerned so she underwent repeat cardiac cath which showed the stent was patent.   On follow up today her chest pain symptoms have resolved. She does note some SOB in the early morning. Also has some  cough with mucus production. Has persistent problems with balance and back pain.    Past Medical History:  Diagnosis Date   AAA (abdominal aortic aneurysm)    a. s/p stent grafting in 2009.   Adenomatous colon polyp 05/2001   Adenomatous duodenal polyp    Allergy    Anginal pain (HCC)    COPD (chronic obstructive pulmonary disease) ([place])    pt denies COPD   Coronary artery disease    a. s/p CABG in 1995;  b. 05/2013 Neg MV, EF 82%;  c. 06/2013 Cath: LM nl, LAD 40-50p, LCX nl, RCA 80p/12m, VG->RCA->PDA->PLSA min irregs, LIMA->LAD atretic, vigorous LV fxn.   Diabetes mellitus without complication (HCC)    history of, resolved. 2017   Diverticulosis    Eczema, dyshidrotic    Eczema, dyshidrotic 1986   Dr. Mathis Fare   Emphysema of lung [facility])    Fatty liver    Fibromyalgia    GERD (gastroesophageal reflux disease)    Hyperlipidemia    Hypertension    Migraine    Myocardial infarction [facility])    Osteoarthritis    Osteoporosis    unsure, having a bone scan soon   Peripheral arterial disease ([place])    left leg diagnosed by Dr Mare Ferrari   Renal artery stenosis [facility])    a. 2008 s/p PTA    Past Surgical History:  Procedure Laterality Date   ABDOMINAL AORTAGRAM N/A 03/06/2014   Procedure: ABDOMINAL AORTAGRAM;  Surgeon: Rosetta Posner, MD;  Location: [facility] CATH LAB;  Service: Cardiovascular;  Laterality: N/A;   ABDOMINAL AORTIC ANEURYSM REPAIR  2009   stenting   ABDOMINAL HYSTERECTOMY     Total, 1992   BREAST BIOPSY     CARDIAC CATHETERIZATION     CARDIOVASCULAR STRESS TEST  11/11/2009   normal study   CARPAL TUNNEL RELEASE  08/1993   left wrist   CARPAL TUNNEL RELEASE  01/1997   right   CATARACT EXTRACTION, BILATERAL  2021   CORONARY ARTERY BYPASS GRAFT  07/1994   Triple by Dr Ceasar Mons   CORONARY STENT INTERVENTION N/A 02/02/2021   Procedure: CORONARY STENT INTERVENTION;  Surgeon: Nelva Bush, MD;  Location: [place] CV LAB;  Service: Cardiovascular;  Laterality: N/A;    CORONARY STENT INTERVENTION N/A 06/26/2021   Procedure: CORONARY STENT INTERVENTION;  Surgeon: Sherren Mocha, MD;  Location: [place] CV LAB;  Service: Cardiovascular;  Laterality: N/A;   dental implants     EYE SURGERY  03/2020   cataract   HAND SURGERY Right 09/2002   Right hand pulley release   INTRAVASCULAR ULTRASOUND/IVUS N/A 06/26/2021   Procedure: Intravascular Ultrasound/IVUS;  Surgeon: Sherren Mocha, MD;  Location: [place] CV LAB;  Service: Cardiovascular;  Laterality: N/A;   INTRAVASCULAR ULTRASOUND/IVUS N/A 09/03/2021   Procedure:  Intravascular Ultrasound/IVUS;  Surgeon: Early Osmond, MD;  Location: [place] CV LAB;  Service: Cardiovascular;  Laterality: N/A;   LEFT HEART CATH AND CORONARY ANGIOGRAPHY N/A 06/26/2021   Procedure: LEFT HEART CATH AND CORONARY ANGIOGRAPHY;  Surgeon: Sherren Mocha, MD;  Location: [place] CV LAB;  Service: Cardiovascular;  Laterality: N/A;   LEFT HEART CATH AND CORS/GRAFTS ANGIOGRAPHY N/A 02/02/2021   Procedure: LEFT HEART CATH AND CORS/GRAFTS ANGIOGRAPHY;  Surgeon: Nelva Bush, MD;  Location: [place] CV LAB;  Service: Cardiovascular;  Laterality: N/A;   LEFT HEART CATH AND CORS/GRAFTS ANGIOGRAPHY N/A 09/03/2021   Procedure: LEFT HEART CATH AND CORS/GRAFTS ANGIOGRAPHY;  Surgeon: Early Osmond, MD;  Location: [place] CV LAB;  Service: Cardiovascular;  Laterality: N/A;   LEFT HEART CATHETERIZATION WITH CORONARY/GRAFT ANGIOGRAM N/A 06/11/2013   Procedure: LEFT HEART CATHETERIZATION WITH [name];  Surgeon: Thayer Headings, MD;  Location: [facility] CATH LAB;  Service: Cardiovascular;  Laterality: N/A;   MULTIPLE TOOTH EXTRACTIONS  2000   All upper teeth.  Has full dneture.    RENAL ARTERY STENT  2008   SHOULDER ARTHROSCOPY WITH ROTATOR CUFF REPAIR Right 09/27/2018   Procedure: Right shoulder mini open rotator cuff repair;  Surgeon: Susa Day, MD;  Location: [facility] ORS;  Service: Orthopedics;  Laterality: Right;  90  mins   thyroid cyst apiration  05/1997 and 07/1998   TONSILECTOMY, ADENOIDECTOMY, BILATERAL MYRINGOTOMY AND TUBES  1989   TONSILLECTOMY     TOTAL ABDOMINAL HYSTERECTOMY  1992     Current Outpatient Medications  Medication Sig Dispense Refill   Accu-Chek FastClix Lancets MISC Check blood sugar once daily PRN 100 each 1   albuterol (VENTOLIN HFA) 108 (90 Base) MCG/ACT inhaler Inhale 2 puffs into the lungs every 6 (six) hours as needed for wheezing or shortness of breath. 8 g 2   ALPRAZolam (XANAX) 0.25 MG tablet Take 1 tablet (0.25 mg total) by mouth at bedtime as needed for sleep. 90 tablet 3   aspirin EC 81 MG tablet Take 1 tablet (81 mg total) by mouth daily. 90 tablet 3   atorvastatin (LIPITOR) 80 MG tablet TAKE 1 TABLET BY MOUTH EVERY DAY 90 tablet 3   B Complex-Biotin-FA (B-100 COMPLEX PO) Take 1 tablet by mouth every evening.     bismuth subsalicylate (PEPTO BISMOL) 262 MG/15ML suspension Take 30 mLs by mouth every 6 (six) hours as needed.     Budeson-Glycopyrrol-Formoterol (BREZTRI AEROSPHERE) 160-9-4.8 MCG/ACT AERO Inhale 2 puffs into the lungs in the morning and at bedtime. 10.7 g 5   cetirizine (ZYRTEC) 10 MG tablet Take 10 mg by mouth daily as needed for allergies.     cholecalciferol (VITAMIN D3) 25 MCG (1000 UNIT) tablet Take 1,000 Units by mouth every evening.     CORDRAN 4 MCG/SQCM TAPE Apply 1 each topically daily as needed (Rash).      cycloSPORINE (RESTASIS) 0.05 % ophthalmic emulsion Place 1 drop into both eyes 2 (two) times daily.      ezetimibe (ZETIA) 10 MG tablet Take 1 tablet (10 mg total) by mouth daily. 90 tablet 3   gabapentin (NEURONTIN) 300 MG capsule Take 1 capsule (300 mg total) by mouth 3 (three) times daily. 270 capsule 3   glucose blood (ACCU-CHEK SMARTVIEW) test strip CHECK BLOOD SUGAR ONCE DAILY AS NEEDED 100 strip 1   isosorbide mononitrate (IMDUR) 30 MG 24 hr tablet Take 3 tablets ( 90 mg ) every morning 270 tablet 3   metoprolol tartrate (LOPRESSOR)  25 MG  tablet Take 1&1/2 tablets twice a day 270 tablet 3   Multiple Vitamins-Minerals (MULTIVITAMIN WITH MINERALS) tablet Take 1 tablet by mouth every evening.     nitroGLYCERIN (NITROSTAT) 0.4 MG SL tablet Place 1 tablet (0.4 mg total) under the tongue every 5 (five) minutes as needed for chest pain. 25 tablet 11   omeprazole (PRILOSEC) 40 MG capsule Take 1 capsule (40 mg total) by mouth in the morning and at bedtime. 180 capsule 3   oxyCODONE-acetaminophen (PERCOCET) 10-325 MG tablet Take 1 tablet by mouth See admin instructions. Take 1 tablet twice daily, may take a third tablet as needed for pain     prasugrel (EFFIENT) 5 MG TABS tablet Take 5 mg by mouth daily.     PRESCRIPTION MEDICATION Place 2 puffs into both ears daily as needed (ear infections). [facility] ear powder     Simethicone (GAS-X PO) Take 1-2 tablets by mouth daily as needed (gas).     sodium chloride (OCEAN) 0.65 % SOLN nasal spray Place 1 spray into both nostrils as needed for congestion.     tretinoin (RETIN-A) 0.1 % cream Apply 1 application topically every other day. In the evening     hydrochlorothiazide (HYDRODIURIL) 12.5 MG tablet Take 1 tablet (12.5 mg total) by mouth daily. 90 tablet 3   losartan (COZAAR) 50 MG tablet Take 1 tablet (50 mg total) by mouth daily. 90 tablet 2   No current facility-administered medications for this visit.    Allergies:   Brilinta [ticagrelor], Bextra [valdecoxib], Hydrocod poli-chlorphe poli er, Lipitor [atorvastatin], Mevacor [lovastatin], Plavix [clopidogrel bisulfate], Zocor [simvastatin], Doxycycline, Metoprolol, Morphine and related, Codeine, and Lisinopril    Social History:  The patient  reports that she quit smoking about 26 years ago. Her smoking use included cigarettes. She has never used smokeless tobacco. She reports current alcohol use of about 6.0 - 10.0 standard drinks per week. She reports that she does not use drugs.   Family History:  The patient's family history includes AAA  (abdominal aortic aneurysm) in her brother; Breast cancer in her sister; Colon cancer in her cousin and paternal grandmother; Diabetes in her brother and sister; Heart attack in her brother, father, mother, and sister; Heart disease in her brother, brother, father, mother, and sister; Hyperlipidemia in her brother and sister; Hypertension in her brother and sister; Liver cancer in her sister.    ROS:  Please see the history of present illness.   Otherwise, review of systems are positive for none.   All other systems are reviewed and negative.    PHYSICAL EXAM: VS:  BP 132/60    Pulse 65    Ht 5' (1.524 m)    Wt 123 lb 6.4 oz (56 kg)    SpO2 93%    BMI 24.10 kg/m  , BMI Body mass index is 24.1 kg/m. GENERAL:  Overweight  WF in NAD HEENT:  PERRL, EOMI, sclera are clear. Oropharynx is clear. NECK:  No jugular venous distention, carotid upstroke brisk and symmetric, no bruits, no thyromegaly or adenopathy LUNGS:  Clear to auscultation bilaterally CHEST:  Unremarkable HEART:  RRR,  PMI not displaced or sustained,S1 and S2 within normal limits, no S3, no S4: no clicks, no rubs, no murmurs ABD:  Soft, nontender. BS +, no masses or bruits. No hepatomegaly, no splenomegaly EXT:  1 + pulses throughout, no edema, no cyanosis no clubbing SKIN:  Warm and dry.  No rashes NEURO:  Alert and oriented x 3. Cranial  nerves II through XII intact. PSYCH:  Cognitively intact     EKG:  EKG is not done today.    Recent Labs: 03/16/2021: Magnesium 1.6 11/09/2021: TSH 1.62 01/06/2022: ALT 16; BUN 8; Creatinine, Ser 0.68; Hemoglobin 14.2; Platelets 214; Potassium 4.4; Sodium 142   Labs from primary care 12/15/16: A1c 5.6%. : Cholesterol 167 Trig-209, HDL 52, LDL 73.  AST 51, ALT 65. Normal renal indices. TSH is normal. Dated 06/24/17: CRP <0.3. Glucose 120, AST 58, ALT 80, other chemistries normal.  Dated 09/28/18: normal CBC, CMET  Lipid Panel    Component Value Date/Time   CHOL 148 01/06/2022 0954   TRIG  125 01/06/2022 0954   HDL 52 01/06/2022 0954   CHOLHDL 2.8 01/06/2022 0954   CHOLHDL 2.9 06/25/2021 0408   VLDL 20 06/25/2021 0408   LDLCALC 74 01/06/2022 0954   LDLDIRECT 82.0 06/13/2020 1017      Wt Readings from Last 3 Encounters:  01/11/22 123 lb 6.4 oz (56 kg)  12/17/21 123 lb 6.4 oz (56 kg)  11/12/21 121 lb (54.9 kg)    Cardiac cath / PCI 02/02/21: CORONARY STENT INTERVENTION  LEFT HEART CATH AND CORS/GRAFTS ANGIOGRAPHY    Conclusion  Conclusions: Severe single-vessel coronary artery disease with chronic total occlusion of mid RCA. Mild to moderate, diffuse LAD disease with competitive flow in the distal vessel from LIMA-LAD. Small but patent LIMA-LAD. Patent SVG-rPDA-rPL with diffuse ostial/proximal disease of up to 60% and tandem hazy 95% and 90% mid graft lesions. Successful PCI to ostial/proximal and mid SVG-rPDA-rPL using non-overlapping Resolute Onyx 3.0 x 26 mm and 3.5 x 34 mm drug-eluting stents with 0% residual stenosis and TIMI-3 flow. Left forearm hematoma following sheath removal ant TR band placement, controlled with manual compression and placement of second TR band proximal to the first band.   Recommendations: Dual antiplatelet therapy with aspirin and ticagrelor for at least 12 months. Aggressive secondary prevention. Close monitoring of left forearm hematoma.   Nelva Bush, MD [facility]    Coronary Diagrams   Diagnostic Dominance: Right    Intervention     Implants       Echo 01/31/21: IMPRESSIONS     1. Left ventricular ejection fraction, by estimation, is 60 to 65%. The  left ventricle has normal function. The left ventricle has no regional  wall motion abnormalities. Left ventricular diastolic parameters are  consistent with Grade I diastolic  dysfunction (impaired relaxation).   2. Right ventricular systolic function is normal. The right ventricular  size is normal. There is normal pulmonary artery systolic pressure.    3. Left atrial size was severely dilated.   4. The mitral valve is grossly normal. Mild mitral valve regurgitation:  Apperance of two jets; study may underestimated regurgitation severity.   5. The aortic valve is tricuspid. Aortic valve regurgitation is not  visualized. No aortic stenosis is present.   6. The inferior vena cava is normal in size with greater than 50%  respiratory variability, suggesting right atrial pressure of 3 mmHg.   Comparison(s): A prior study was performed on 01/09/21. No significant  change from prior study. Prior images reviewed side by side. Similar to  prior.    Cardiac cath 06/26/21: Procedures  CORONARY STENT INTERVENTION  Intravascular Ultrasound/IVUS  LEFT HEART CATH AND CORONARY ANGIOGRAPHY   Conclusion      Dist LM lesion is 15% stenosed.   Prox RCA lesion is 75% stenosed.   Mid RCA to Dist RCA lesion is 100%  stenosed with 100% stenosed side branch in RPAV.   Origin to Prox Graft lesion before RPDA  is 80% stenosed.   Non-stenotic Mid Graft-1 lesion before RPDA was previously treated.   Non-stenotic Mid Graft-2 lesion before RPDA was previously treated.   A drug-eluting stent was successfully placed using a STENT ONYX FRONTIER 3.5X26.   Post intervention, there is a 20% residual stenosis.   Seq SVG- and is large.   LIMA and is small.   There is competitive flow.   1.  Stable native vessel coronary artery disease with chronic total occlusion of the proximal to mid RCA, patency of the left main, diffuse mild to moderate calcific nonobstructive LAD stenosis, and moderate nonobstructive proximal left circumflex stenosis 2.  Continued patency of the LIMA to LAD 3.  Continued patency of the sequential saphenous vein graft to PDA and posterolateral branches with severe in-stent restenosis in the proximal stent, both lesions likely related to stent fracture 4.  Successful intravascular ultrasound-guided PCI of the saphenous vein graft to RCA using a 3.5  x 26 mm resolute Onyx DES postdilated to high pressure with a 4.0 mm noncompliant balloon.  Diagnostic Dominance: Right Intervention    Implants    Cardiac cath 09/03/21:  Early Osmond, MD (Primary)    Martinique, Nyla Creason M, MD (Assisting)     Procedures  Intravascular Ultrasound/IVUS  LEFT HEART CATH AND CORS/GRAFTS ANGIOGRAPHY   Conclusion      Dist LM lesion is 15% stenosed.   Prox RCA lesion is 75% stenosed.   Mid RCA to Dist RCA lesion is 100% stenosed with 100% stenosed side branch in RPAV.   Origin to Prox Graft lesion before RPDA  is 20% stenosed.   Non-stenotic Mid Graft-1 lesion before RPDA was previously treated.   Non-stenotic Mid Graft-2 lesion before RPDA was previously treated.   Seq SVG- and is large.   LIMA and is small.   There is competitive flow.   LV end diastolic pressure is normal.    Patent vein graft to PDA with minimal constraint; IVUS interrogation demonstrated a minimal stent area of 5.37mm2. Patent LIMA to LAD with relatively unchanged burden of disease elsewhere. Normal LVEDP   Recommendations:  Medical management.    ASSESSMENT AND PLAN:  1. CAD status post CABG in 1995.  s/p NSTEMI in Feb. Cardiac cath demonstrated patent LIMA to the LAD. Occluded native RCA. SVG to PDA/PL had severe sequential stenoses treated with DES x 2. Normal EF.  Developed dyspnea on Brilinta. Allergic reaction to Plavix. Now on Prasugrel lower dose due to age and female sex. Had recurrent angina in July. Restenosis in the proximal stent in the SVG to RCA with some associated stent fracture. Had repeat stenting with IVUS guidance. Now 2 months later has recurrent angina on optimal medical therapy. Fortunately repeat cath showed the stent was still patent. We did discuss that if she had recurrence we would try Shockwave therapy. Will continue RX  2. HTN with hypertensive heart disease. BP is under  good control.   3. Hypercholesterolemia- LDL is a little above goal.  Added Zetia and will repeat lab in 3 months.  4.  Status post stent to right renal artery 2008. Patent by CT in October  5. Status post stent graft to abdominal aortic aneurysm 2009.  S/p iliac stent in 2016. Stable by CT in October  6. Peripheral artery disease followed by Dr. Donzetta Matters  7. Lumbar spine disease. S/p fusion with persistent pain. I  told her she could have a myelogram now. Would need to hold Effient for 6 days. Will follow up with Dr Ronnald Ramp.   Dispostion: follow up in 6 months.   Signed, Brenetta Penny Martinique MD, [facility]    01/11/2022 12:03 PM    [place]

## 2022-01-11 ENCOUNTER — Ambulatory Visit: Payer: Medicare Other | Admitting: Cardiology

## 2022-01-11 ENCOUNTER — Other Ambulatory Visit: Payer: Self-pay

## 2022-01-11 ENCOUNTER — Telehealth: Payer: Self-pay | Admitting: Family Medicine

## 2022-01-11 ENCOUNTER — Encounter: Payer: Self-pay | Admitting: Cardiology

## 2022-01-11 VITALS — BP 132/60 | HR 65 | Ht 60.0 in | Wt 123.4 lb

## 2022-01-11 DIAGNOSIS — E782 Mixed hyperlipidemia: Secondary | ICD-10-CM

## 2022-01-11 DIAGNOSIS — I119 Hypertensive heart disease without heart failure: Secondary | ICD-10-CM

## 2022-01-11 DIAGNOSIS — I25708 Atherosclerosis of coronary artery bypass graft(s), unspecified, with other forms of angina pectoris: Secondary | ICD-10-CM

## 2022-01-11 MED ORDER — ALBUTEROL SULFATE HFA 108 (90 BASE) MCG/ACT IN AERS: 2.0000 | INHALATION_SPRAY | Freq: Four times a day (QID) | RESPIRATORY_TRACT | 2 refills | Status: DC | PRN

## 2022-01-11 NOTE — Chronic Care Management (AMB) (Signed)
°  Chronic Care Management   Note  01/11/2022 Name: Megan [name] MRN: [id number] DOB: [birth year]  Megan [name] is a 76 y.o. year old female who is a primary care patient of [name], MD. I reached out to Guerry Minors by phone today in response to a referral sent by Ms. Buford Dresser PCP, [name], MD.   Ms. Lax was given information about Chronic Care Management services today including:  CCM service includes personalized support from designated clinical staff supervised by her physician, including individualized plan of care and coordination with other care providers 24/7 contact phone numbers for assistance for urgent and routine care needs. Service will only be billed when office clinical staff spend 20 minutes or more in a month to coordinate care. Only one practitioner may furnish and bill the service in a calendar month. The patient may stop CCM services at any time (effective at the end of the month) by phone call to the office staff.   Patient agreed to services and verbal consent obtained.   Follow up plan:   Tatjana Secretary/administrator

## 2022-01-14 DIAGNOSIS — M25552 Pain in left hip: Secondary | ICD-10-CM | POA: Diagnosis not present

## 2022-01-14 DIAGNOSIS — M25551 Pain in right hip: Secondary | ICD-10-CM | POA: Diagnosis not present

## 2022-01-14 DIAGNOSIS — M7061 Trochanteric bursitis, right hip: Secondary | ICD-10-CM | POA: Diagnosis not present

## 2022-01-18 DIAGNOSIS — H02835 Dermatochalasis of left lower eyelid: Secondary | ICD-10-CM | POA: Diagnosis not present

## 2022-01-18 DIAGNOSIS — H53483 Generalized contraction of visual field, bilateral: Secondary | ICD-10-CM | POA: Diagnosis not present

## 2022-01-18 DIAGNOSIS — H02832 Dermatochalasis of right lower eyelid: Secondary | ICD-10-CM | POA: Diagnosis not present

## 2022-01-18 DIAGNOSIS — H57813 Brow ptosis, bilateral: Secondary | ICD-10-CM | POA: Diagnosis not present

## 2022-01-18 DIAGNOSIS — H02423 Myogenic ptosis of bilateral eyelids: Secondary | ICD-10-CM | POA: Diagnosis not present

## 2022-01-27 ENCOUNTER — Telehealth: Payer: Self-pay

## 2022-01-27 NOTE — Telephone Encounter (Signed)
Patient called regarding her Plate card paper work. Patient will be coming in office tomorrow to bring her paper work for me to fill out.

## 2022-01-27 NOTE — Telephone Encounter (Signed)
Patient states she picked up a parking placard form today.  States this was not the form that she dropped off.  States she dropped off the form for the parking tag.  Is requesting a call back in regard.

## 2022-01-27 NOTE — Telephone Encounter (Signed)
Spoke with patient.

## 2022-02-04 ENCOUNTER — Other Ambulatory Visit: Payer: Self-pay | Admitting: Neurological Surgery

## 2022-02-04 ENCOUNTER — Telehealth: Payer: Self-pay | Admitting: Pharmacist

## 2022-02-04 DIAGNOSIS — R2681 Unsteadiness on feet: Secondary | ICD-10-CM | POA: Diagnosis not present

## 2022-02-04 DIAGNOSIS — M48061 Spinal stenosis, lumbar region without neurogenic claudication: Secondary | ICD-10-CM

## 2022-02-04 NOTE — Progress Notes (Signed)
? ? ?Chronic Care Management ?Pharmacy Assistant  ? ?Name: Megan [name]  MRN: [id number] DOB: 09-Jan-1946 ? ? ?Reason for Encounter: Chart Review For Initial Visit With Clinical Pharmacist ?  ?Conditions to be addressed/monitored: ?HTN, CAD, COPD, GERD, HLD, Chronic Pain ? ?Primary concerns for visit include: ?HTN, CAD, Chronic Pain  ? ?Recent office visits:  ?12/17/2021 [facility] (PCP) [name], MD; no medication changes indicated. ? ?11/12/2021 [facility] (Family Medicine) Lucretia Kern, DO; molnupiravir 200 mg x 5 days for COVID-19 ? ?Recent consult visits:  ?01/11/2022 [facility] (Cardiology) [place]ique, Peter M, MD; LDL is a little above goal. Added Zetia and will repeat lab in 3 months. ? ?11/09/2021 [facility] (Endocrinology) Shamleffer, Melanie Crazier, MD; no medication changes indicated. ? ?09/30/2021 [facility] (Vascular Surgeon) Waynetta Sandy, MD; no medication changes indicated. ? ?09/23/2021 [facility] (Cardiology) [place], Peter M, MD; no medication changes indicated. ? ?09/09/2021 [facility] (Neurology) Penumalli, Earlean Polka, MD; no medication changes indicated. ? ?09/02/2021 [facility] (Neurology) Lomax, Amy, NP; no medication changes indicated. ? ?08/31/2021 [facility] (Cardiology) Martinique, Peter M, MD; no medication changes indicated. ? ?08/25/2021 [facility] (Ophthalmology) Jola Schmidt; no further information available. ? ?Hospital visits:  ?09/03/2021 Admission due to Unstable angina at [place] date: 09/03/2021 ?Discharge date: 09/04/2021 ?Cath: 09/03/21 ? ?Medications: ?Outpatient Encounter Medications as of 02/04/2022  ?Medication Sig  ? Accu-Chek FastClix Lancets MISC Check blood sugar once daily PRN  ? albuterol (VENTOLIN HFA) 108 (90 Base) MCG/ACT inhaler Inhale 2 puffs into the lungs every 6 (six) hours as needed for wheezing or shortness of breath.  ? ALPRAZolam (XANAX) 0.25 MG tablet Take 1 tablet (0.25 mg total) by mouth at bedtime as needed for sleep.  ? aspirin EC 81 MG tablet Take 1 tablet (81 mg total) by mouth daily.  ?  atorvastatin (LIPITOR) 80 MG tablet TAKE 1 TABLET BY MOUTH EVERY DAY  ? B Complex-Biotin-FA (B-100 COMPLEX PO) Take 1 tablet by mouth every evening.  ? bismuth subsalicylate (PEPTO BISMOL) 262 MG/15ML suspension Take 30 mLs by mouth every 6 (six) hours as needed.  ? Budeson-Glycopyrrol-Formoterol (BREZTRI AEROSPHERE) 160-9-4.8 MCG/ACT AERO Inhale 2 puffs into the lungs in the morning and at bedtime.  ? cetirizine (ZYRTEC) 10 MG tablet Take 10 mg by mouth daily as needed for allergies.  ? cholecalciferol (VITAMIN D3) 25 MCG (1000 UNIT) tablet Take 1,000 Units by mouth every evening.  ? CORDRAN 4 MCG/SQCM TAPE Apply 1 each topically daily as needed (Rash).   ? cycloSPORINE (RESTASIS) 0.05 % ophthalmic emulsion Place 1 drop into both eyes 2 (two) times daily.   ? ezetimibe (ZETIA) 10 MG tablet Take 1 tablet (10 mg total) by mouth daily.  ? gabapentin (NEURONTIN) 300 MG capsule Take 1 capsule (300 mg total) by mouth 3 (three) times daily.  ? glucose blood (ACCU-CHEK SMARTVIEW) test strip CHECK BLOOD SUGAR ONCE DAILY AS NEEDED  ? hydrochlorothiazide (HYDRODIURIL) 12.5 MG tablet Take 1 tablet (12.5 mg total) by mouth daily.  ? isosorbide mononitrate (IMDUR) 30 MG 24 hr tablet Take 3 tablets ( 90 mg ) every morning  ? losartan (COZAAR) 50 MG tablet Take 1 tablet (50 mg total) by mouth daily.  ? metoprolol tartrate (LOPRESSOR) 25 MG tablet Take 1&1/2 tablets twice a day  ? Multiple Vitamins-Minerals (MULTIVITAMIN WITH MINERALS) tablet Take 1 tablet by mouth every evening.  ? nitroGLYCERIN (NITROSTAT) 0.4 MG SL tablet Place 1 tablet (0.4 mg total) under the tongue every 5 (five) minutes as needed for chest  pain.  ? omeprazole (PRILOSEC) 40 MG capsule Take 1 capsule (40 mg total) by mouth in the morning and at bedtime.  ? oxyCODONE-acetaminophen (PERCOCET) 10-325 MG tablet Take 1 tablet by mouth See admin instructions. Take 1 tablet twice daily, may take a third tablet as needed for pain  ? prasugrel (EFFIENT) 5 MG TABS  tablet Take 5 mg by mouth daily.  ? PRESCRIPTION MEDICATION Place 2 puffs into both ears daily as needed (ear infections). [facility] ear powder  ? Simethicone (GAS-X PO) Take 1-2 tablets by mouth daily as needed (gas).  ? sodium chloride (OCEAN) 0.65 % SOLN nasal spray Place 1 spray into both nostrils as needed for congestion.  ? tretinoin (RETIN-A) 0.1 % cream Apply 1 application topically every other day. In the evening  ? ?No facility-administered encounter medications on file as of 02/04/2022.  ? ?Current Medications: ?Albuterol 108 mcg last filled 01/11/2022 30 DS ?Ezetimibe 10 mg last filled 01/07/2022 90 DS ?Metoprolol Tartrate 25 mg last filled 11/18/2021 90 DS ?Gabapentin 300 mg last filled 11/26/2021 90 DS ?Isosorbide mononitrate 30 mg last filled 12/30/2021 90 DS ?Prasugrel 5 mg last filled 11/24/2021 90 DS ?Vitamin D3 1000 unit daily ?Pepto Bismol as needed ?Simethicone (Gas-X) as needed ?Sodium Chloride 0.65% nasal spray as needed ?Cetirizine 10 mg as needed ?Atorvastatin 80 mg last filled 11/10/2021 90 DS ?Losartan 50 mg last filled 01/28/2022 90 DS ?Hydrochlorothiazide 12.5 mg last filled 01/28/2022 90 DS ?Omeprazole 40 mg last filled 11/10/2021 90 DS ?Oxycodone-Acetaminophen 10-325 mg LF 01/07/2022 30 DS ?Alprazolam 0.25 mg last filled 09/15/2020 90 DS ?Multiple Vitamins-Minerals takes daily ?Aspirin 81 mg takes daily ?Breztri Aerosphere inhaler last filled 03/10/2021 30 DS ?Nitroglycerin 0.4 mg ?Accu-chek FastClix Lancets ?B Complex takes daily ?Cordran 4 mcg/sqcm tape uses prn ?Tretinoin 0.1% cream last filled 03/10/2021 30 DS ?Cyclosporine 0.05% oph last filled 04/21/2021 90 DS no longer using ? ?Patient Questions: ?Any changes in your medications or health? ?Patient denies any recent changes in her medications or health. ? ?Any side effects from any medications?  ?Patient states statins causes her to have aches and pains. She is also sensitive to opioids. They make her break out and itch. ? ?Do you  have any symptoms or problems not managed by your medications? ?She denies having any symptoms or problems that is not currently managed by her medications. ? ?Any concerns about your health right now? ?Patient states back pain has messed up her balance and she has heart issues that worry her. ? ?Has your provider asked that you check blood pressure, blood sugar, or follow special diet at home? ?Patient states she has a cuff at home but doesn't really check her blood pressure. She does not follow any special diet. ? ?Do you get any type of exercise on a regular basis? ?"I try to walk at least 1 mile a day sometimes 2 miles." ? ?Can you think of a goal you would like to reach for your health? ?"I would like to live to 83." ? ?Do you have any problems getting your medications? ?Patient denies having any problems getting her medications. ? ?Is there anything that you would like to discuss during the appointment?  ?Patient states she does not have anything she would like to discuss. ? ?Please bring medications and supplements to appointment ? ? ?Care Gaps: ?Medicare Annual Wellness: last AWV 06/13/2020 ?Hemoglobin A1C: 5.8% on 01/06/2022 ?Colonoscopy: Next due on 11/04/2024 ?Mammogram: Next due on 12/18/2022 ? ?Future Appointments  ?Date Time Provider Department  Center  ?02/09/2022  9:30 AM [facility] CCM PHARMACIST [facility]  ?06/21/2022 11:00 AM [name], MD [facility]  ?11/10/2022 11:10 AM Shamleffer, Melanie Crazier, MD [facility] None  ? ? ?Star Rating Drugs: ?Atorvastatin 80 mg last filled 11/10/2021 90 DS ?Losartan 50 mg last filled 01/28/2022 90 DS ? ?April D Calhoun, [place] ?Clinical Pharmacist Assistant ?[phone]  ?

## 2022-02-08 NOTE — Progress Notes (Signed)
Chronic Care Management Pharmacy Note  02/10/2022 Name:  Megan [name] MRN:  [id number] DOB:  Nov 08, 1946  Summary: Initial visit with PharmD.  Was not using Breztri daily - has been approved for Breztri PAP through [employer] to get for free this calendar year - should improve adherence.  Tolerating Zetia fine since starting - goal LDL now < 55 due to high risk.  Recommendations/Changes made from today's visit: Take Breztri daily Consider PCSK-9 if Zetia does not improve lipids to goal  Plan: FU 6 months CMA to call for adherence packaging and delivery with [facility]   Subjective: Megan [name] is an 76 y.o. year old female who is a primary patient of [name], MD.  The CCM team was consulted for assistance with disease management and care coordination needs.    Engaged with patient face to face for initial visit in response to provider referral for pharmacy case management and/or care coordination services.   Consent to Services:  The patient was given the following information about Chronic Care Management services today, agreed to services, and gave verbal consent: 1. CCM service includes personalized support from designated clinical staff supervised by the primary care provider, including individualized plan of care and coordination with other care providers 2. 24/7 contact phone numbers for assistance for urgent and routine care needs. 3. Service will only be billed when office clinical staff spend 20 minutes or more in a month to coordinate care. 4. Only one practitioner may furnish and bill the service in a calendar month. 5.The patient may stop CCM services at any time (effective at the end of the month) by phone call to the office staff. 6. The patient will be responsible for cost sharing (co-pay) of up to 20% of the service fee (after annual deductible is met). Patient agreed to services and consent obtained.  Patient Care Team: [name], MD as PCP - General (Family  Medicine) [place], Peter M, MD as PCP - Cardiology (Cardiology) [place], Peter M, MD as Consulting Physician (Cardiology) Vicie Mutters, MD as Consulting Physician (Otolaryngology) Ladene Artist, MD as Consulting Physician (Gastroenterology) Penni Bombard, MD as Consulting Physician (Neurology) Reynold Bowen, MD as Consulting Physician (Endocrinology) Sharyne Peach, MD as Consulting Physician (Ophthalmology) Renelda Mom, DMD as Consulting Physician (Dentistry) Rolm Bookbinder, MD as Consulting Physician (Dermatology) Manson Passey, [facility] (Specialist) Woodfin Ganja (Dentistry) Edythe Clarity, [facility] as Pharmacist (Pharmacist)  Recent office visits:  12/17/2021 OV (PCP) [name], MD; no medication changes indicated.   11/12/2021 VV (Family Medicine) Lucretia Kern, DO; molnupiravir 200 mg x 5 days for COVID-19   Recent consult visits:  01/11/2022 OV (Cardiology) [place]ique, Peter M, MD; LDL is a little above goal. Added Zetia and will repeat lab in 3 months.   11/09/2021 OV (Endocrinology) Shamleffer, Melanie Crazier, MD; no medication changes indicated.   09/30/2021 OV (Vascular Surgeon) Waynetta Sandy, MD; no medication changes indicated.   09/23/2021 OV (Cardiology) [place], Peter M, MD; no medication changes indicated.   09/09/2021 OV (Neurology) Penumalli, Earlean Polka, MD; no medication changes indicated.   09/02/2021 OV (Neurology) Lomax, Amy, NP; no medication changes indicated.   08/31/2021 OV (Cardiology) [place], Peter M, MD; no medication changes indicated.   08/25/2021 OV (Ophthalmology) Jola Schmidt; no further information available.   Objective:  Lab Results  Component Value Date   CREATININE 0.68 01/06/2022   BUN 8 01/06/2022   GFR 86.76 03/16/2021   EGFR 90 01/06/2022   GFRNONAA >60  09/04/2021   GFRAA 89 01/01/2020   NA 142 01/06/2022   K 4.4 01/06/2022   CALCIUM 10.1 01/06/2022   CO2 28 01/06/2022   GLUCOSE 97 01/06/2022    Lab Results   Component Value Date/Time   HGBA1C 5.8 (H) 01/06/2022 09:54 AM   HGBA1C 5.7 (H) 04/20/2021 08:49 AM   GFR 86.76 03/16/2021 01:47 PM   GFR 66.56 01/19/2021 02:48 PM    Last diabetic Eye exam: No results found for: HMDIABEYEEXA  Last diabetic Foot exam: No results found for: HMDIABFOOTEX   Lab Results  Component Value Date   CHOL 148 01/06/2022   HDL 52 01/06/2022   LDLCALC 74 01/06/2022   LDLDIRECT 82.0 06/13/2020   TRIG 125 01/06/2022   CHOLHDL 2.8 01/06/2022    Hepatic Function Latest Ref Rng & Units 01/06/2022 04/20/2021 03/16/2021  Total Protein 6.0 - 8.5 g/dL 5.7(L) 5.7(L) 6.2  Albumin 3.7 - 4.7 g/dL 3.9 4.0 3.6  AST 0 - 40 IU/L $Remov'18 30 18  'eSLNVG$ ALT 0 - 32 IU/L $Remov'16 27 12  'AlZuAU$ Alk Phosphatase 44 - 121 IU/L 87 77 72  Total Bilirubin 0.0 - 1.2 mg/dL 0.6 0.3 0.5  Bilirubin, Direct 0.00 - 0.40 mg/dL 0.21 0.12 -    Lab Results  Component Value Date/Time   TSH 1.62 11/09/2021 10:40 AM   TSH 0.714 06/27/2021 03:51 AM   TSH 1.81 06/13/2020 10:17 AM   FREET4 0.85 11/09/2021 10:40 AM   FREET4 1.05 06/27/2021 03:51 AM    CBC Latest Ref Rng & Units 01/06/2022 09/04/2021 08/31/2021  WBC 3.4 - 10.8 x10E3/uL 6.6 8.3 9.2  Hemoglobin 11.1 - 15.9 g/dL 14.2 13.3 15.1  Hematocrit 34.0 - 46.6 % 42.8 39.9 46.4  Platelets 150 - 450 x10E3/uL 214 196 235    No results found for: VD25OH  Clinical ASCVD: Yes  The ASCVD Risk score (Arnett DK, et al., 2019) failed to calculate for the following reasons:   The patient has a prior MI or stroke diagnosis    Depression screen [facility] 2/9 06/19/2021 06/13/2020 07/30/2019  Decreased Interest 0 0 0  Down, Depressed, Hopeless 0 0 0  PHQ - 2 Score 0 0 0  Some recent data might be hidden     Social History   Tobacco Use  Smoking Status Former   Packs/day: 0.00   Years: 37.00   Pack years: 0.00   Types: Cigarettes   Quit date: 12/07/1995   Years since quitting: 26.1  Smokeless Tobacco Never   BP Readings from Last 3 Encounters:  01/11/22 132/60  12/17/21 (!)  129/58  11/09/21 124/78   Pulse Readings from Last 3 Encounters:  01/11/22 65  12/17/21 62  11/09/21 61   Wt Readings from Last 3 Encounters:  01/11/22 123 lb 6.4 oz (56 kg)  12/17/21 123 lb 6.4 oz (56 kg)  11/12/21 121 lb (54.9 kg)   BMI Readings from Last 3 Encounters:  01/11/22 24.10 kg/m  12/17/21 24.10 kg/m  11/12/21 23.63 kg/m    Assessment/Interventions: Review of patient past medical history, allergies, medications, health status, including review of consultants reports, laboratory and other test data, was performed as part of comprehensive evaluation and provision of chronic care management services.   SDOH:  (Social Determinants of Health) assessments and interventions performed: Yes  Financial Resource Strain: Not on file   Food Insecurity: No Food Insecurity   Worried About Charity fundraiser in the Last Year: Never true   Ran Out of Food in the  Last Year: Never true    SDOH Screenings   Alcohol Screen: Not on file  Depression (PHQ2-9): Low Risk    PHQ-2 Score: 0  Financial Resource Strain: Not on file  Food Insecurity: No Food Insecurity   Worried About Running Out of Food in the Last Year: Never true   Ran Out of Food in the Last Year: Never true  Housing: Not on file  Physical Activity: Not on file  Social Connections: Not on file  Stress: Not on file  Tobacco Use: Medium Risk   Smoking Tobacco Use: Former   Smokeless Tobacco Use: Never   Passive Exposure: Not on file  Transportation Needs: Not on file    [place]  Allergies  Allergen Reactions   Brilinta [Ticagrelor] Shortness Of Breath   Bextra [Valdecoxib] Other (See Comments)    Increased lft's    Hydrocod Poli-Chlorphe Poli Er Rash   Lipitor [Atorvastatin] Other (See Comments)    Leg weakness    Mevacor [Lovastatin] Other (See Comments)    Muscle weakness    Plavix [Clopidogrel Bisulfate] Itching and Other (See Comments)    Hands and feet tingle and itch   Zocor  [Simvastatin] Other (See Comments)    Bones hurt    Doxycycline Diarrhea and Nausea And Vomiting   Metoprolol Other (See Comments)    Hair loss   Morphine And Related Itching and Nausea Only   Codeine Itching and Rash    Hyper-active   Lisinopril Cough    Medications Reviewed Today     Reviewed by Edythe Clarity, [facility] (Pharmacist) on 02/10/22 at [place] List Status: <None>   Medication Order Taking? Sig Documenting Provider Last Dose Status Informant  Accu-Chek FastClix Lancets MISC [id number] Yes Check blood sugar once daily PRN [name], MD Taking Active Self           Med Note Alfonse Spruce, ANH T   Tue Jan 06, 2021  1:20 PM)    albuterol (VENTOLIN HFA) 108 (90 Base) MCG/ACT inhaler [id number] Yes Inhale 2 puffs into the lungs every 6 (six) hours as needed for wheezing or shortness of breath. [place], Peter M, MD Taking Active   ALPRAZolam Duanne Moron) 0.25 MG tablet [id number] Yes Take 1 tablet (0.25 mg total) by mouth at bedtime as needed for sleep. [place], Peter M, MD Taking Active Self  aspirin EC 81 MG tablet [id number] Yes Take 1 tablet (81 mg total) by mouth daily. [place], Peter M, MD Taking Active Self  atorvastatin (LIPITOR) 80 MG tablet [id number] Yes TAKE 1 TABLET BY MOUTH EVERY DAY [place], Peter M, MD Taking Active   B Complex-Biotin-FA (B-100 COMPLEX PO) [id number] Yes Take 1 tablet by mouth every evening. [provider] Taking Active Self  bismuth subsalicylate (PEPTO BISMOL) 262 MG/15ML suspension [id number] Yes Take 30 mLs by mouth every 6 (six) hours as needed. [provider] Taking Active Self  Budeson-Glycopyrrol-Formoterol (BREZTRI AEROSPHERE) 160-9-4.8 MCG/ACT AERO [id number] Yes Inhale 2 puffs into the lungs in the morning and at bedtime. [name], MD Taking Active   cetirizine (ZYRTEC) 10 MG tablet [id number] Yes Take 10 mg by mouth daily as needed for allergies. [provider] Taking Active Self  cholecalciferol (VITAMIN D3) 25 MCG  (1000 UNIT) tablet [id number] Yes Take 1,000 Units by mouth every evening. [provider] Taking Active Self  CORDRAN 4 MCG/SQCM TAPE [id number] Yes Apply 1 each topically daily as needed (Rash).  [provider] Taking Active Self  cycloSPORINE (RESTASIS) 0.05 % ophthalmic emulsion [id number] Yes Place 1 drop into both eyes 2 (two) times daily.  [provider] Taking Active Self  ezetimibe (ZETIA) 10 MG tablet [id number] Yes Take 1 tablet (10 mg total) by mouth daily. [place], Peter M, MD Taking Active   gabapentin (NEURONTIN) 300 MG capsule [id number] Yes Take 1 capsule (300 mg total) by mouth 3 (three) times daily. Lomax, Amy, NP Taking Active   glucose blood (ACCU-CHEK SMARTVIEW) test strip [id number] Yes CHECK BLOOD SUGAR ONCE DAILY AS NEEDED Willow Ora, MD Taking Active Self  hydrochlorothiazide (HYDRODIURIL) 12.5 MG tablet [id number]  Take 1 tablet (12.5 mg total) by mouth daily. Ronney Asters, NP  Expired 11/09/21 2359 Self  isosorbide mononitrate (IMDUR) 30 MG 24 hr tablet [id number] Yes Take 3 tablets ( 90 mg ) every morning [place], Peter M, MD Taking Active   losartan (COZAAR) 50 MG tablet [id number]  Take 1 tablet (50 mg total) by mouth daily. [place], Peter M, MD  Expired 11/09/21 2359 Self  metoprolol tartrate (LOPRESSOR) 25 MG tablet [id number] Yes Take 1&1/2 tablets twice a day [place], Peter M, MD Taking Active   Multiple Vitamins-Minerals (MULTIVITAMIN WITH MINERALS) tablet [id number] Yes Take 1 tablet by mouth every evening. [provider] Taking Active Self  nitroGLYCERIN (NITROSTAT) 0.4 MG SL tablet [id number] Yes Place 1 tablet (0.4 mg total) under the tongue every 5 (five) minutes as needed for chest pain. [place], Peter M, MD Taking Active Self  omeprazole (PRILOSEC) 40 MG capsule [id number] Yes Take 1 capsule (40 mg total) by mouth in the morning and at bedtime. Meryl Dare, MD Taking Active Self  oxyCODONE-acetaminophen (PERCOCET) 10-325 MG  tablet [id number] Yes Take 1 tablet by mouth See admin instructions. Take 1 tablet twice daily, may take a third tablet as needed for pain [provider] Taking Active Self  prasugrel (EFFIENT) 5 MG TABS tablet [id number] Yes Take 5 mg by mouth daily. [provider] Taking Active Self  PRESCRIPTION MEDICATION [id number] Yes Place 2 puffs into both ears daily as needed (ear infections). [facility] ear powder [provider] Taking Active Self  Simethicone (GAS-X PO) [id number] Yes Take 1-2 tablets by mouth daily as needed (gas). [provider] Taking Active Self  sodium chloride (OCEAN) 0.65 % SOLN nasal spray [id number] Yes Place 1 spray into both nostrils as needed for congestion. [provider] Taking Active Self  tretinoin (RETIN-A) 0.1 % cream [id number] Yes Apply 1 application topically every other day. In the evening [provider] Taking Active Self            Patient Active Problem List   Diagnosis Date Noted   Thyroid nodule greater than or equal to 1.5 cm in diameter incidentally noted on imaging study 08/27/2021   History of compression fracture of spine - lumbar 06/19/2021   History of lumbosacral spine surgery 03/03/2021   NSTEMI (non-ST elevated myocardial infarction) [facility]) 2022 01/31/2021   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/26/2021   Neuropathic pain 12/05/2020   Gait instability 09/09/2020   Lumbar stenosis without neurogenic claudication 02/26/2020   COPD with chronic bronchitis (HCC) 09/28/2018   Essential (primary) hypertension 07/10/2018   IFG (impaired fasting glucose) 11/10/2017   DJD (degenerative joint disease), lumbosacral 11/10/2017   Fibromyalgia 11/10/2017   Dyshidrotic eczema 11/10/2017   GERD (gastroesophageal reflux disease) 11/10/2017   Osteopenia 11/10/2017   Does use hearing aid 11/10/2017   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   Fatty liver 11/10/2017  Coronary artery disease    Peripheral  vascular disease with claudication ([place])    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   History of abdominal aortic aneurysm repair 05/18/2011   History of renal artery stenosis 05/18/2011   Hyperlipidemia LDL goal <70     Immunization History  Administered Date(s) Administered   Fluad Quad(high Dose 65+) 07/30/2019   Influenza, High Dose Seasonal PF 08/13/2017, 08/26/2018, 09/15/2020   Influenza, Seasonal, Injecte, Preservative Fre 09/21/2014, 09/06/2015, 07/21/2016   Influenza,inj,quad, With Preservative 08/06/2017   Influenza-Unspecified 08/10/2017, 10/13/2021   PFIZER(Purple Top)SARS-COV-2 Vaccination 12/22/2019, 01/12/2020, 09/15/2020   PPD Test 09/13/1979, 03/10/2021   Pfizer Covid-19 Vaccine Bivalent Booster 30yrs & up 10/13/2021   Pneumococcal Conjugate-13 12/31/2016   Pneumococcal Polysaccharide-23 12/16/2014, 08/01/2019   Td 05/13/2008   Tdap 08/09/2012   Zoster Recombinat (Shingrix) 01/13/2018, 03/14/2018   Zoster, Live 11/03/2010    Conditions to be addressed/monitored:  CAD, HTN, Nstemi, GERD, Osteopenia, HLD,   Care Plan : General Pharmacy (Adult)  Updates made by Edythe Clarity, RPH since 02/10/2022 12:00 AM     Problem: CAD, HTN, Nstemi, GERD, Osteopenia, HLD,   Priority: High  Onset Date: 02/10/2022     Long-Range Goal: Patient-Specific Goal   Start Date: 02/10/2022  Expected End Date: 08/13/2022  This Visit's Progress: On track  Priority: High  Note:   Current Barriers:  Daytime sleepiness Inhaler copay is expensive  Pharmacist Clinical Goal(s):  Patient will verbalize ability to afford treatment regimen achieve improvement in LDL as evidenced by labs through collaboration with PharmD and provider.   Interventions: 1:1 collaboration with [name], MD regarding development and update of comprehensive plan of care as evidenced by provider attestation and co-signature Inter-disciplinary care team collaboration (see longitudinal plan of care) Comprehensive  medication review performed; medication list updated in electronic medical record  Hypertension (BP goal <130/80) -Controlled -Current treatment: Losartan 50mg  daily Appropriate, Effective, Safe, Accessible HCTZ 12.5mg  Appropriate, Effective, Safe, Accessible Isosorbide mononitrate 30mg  Appropriate, Effective, Safe, Accessible Metoprolol tartrate 25mg  one and one-half tablets BID Appropriate, Effective, Safe, Accessible -Medications previously tried: amlodipine, hydralazine  -Current home readings: "normal"  no logs today -Current exercise habits: patient reports walking 1-2 miles per day  -Reports hypotensive/hypertensive symptoms - she is tired throughout the day - not to the point where she dozes off quickly but if she sits down in a chair she may fall asleep while watching TV -Educated on BP goals and benefits of medications for prevention of heart attack, stroke and kidney damage; Daily salt intake goal < 2300 mg; Importance of home blood pressure monitoring; Symptoms of hypotension and importance of maintaining adequate hydration; -Counseled to monitor BP at home a few times per week, document, and provide log at future appointments -Recommended to continue current medication No changes at this time - continue to monitor beta blocker dose to see if HR is controlled - this could be contributing to daytime sleepiness.  Hyperlipidemia/CAD/NSTEMI: (LDL goal < 70) -Controlled -Current treatment: Atorvastatin 80mg  Appropriate, Effective, Safe, Accessible Zetia 10mg  (Just started) Appropriate, Query effective,  -Medications previously tried: none noted  -Educated on Cholesterol goals;  Benefits of statin for ASCVD risk reduction; Importance of limiting foods high in cholesterol; -Recommended to continue current medication Started Zetia after most recent LDL of 74.  Goal < 55 due to her high risk.  Recommend recheck lipids after about 3 months of Zetia.  If still not at goal could  consider PCSK-9 inhibitor due to her complicated CV history and high  risk due to previous MI and bypass.  Osteopenia (Goal Reduce fracture risk) -Controlled -Last DEXA Scan: 11/08/2019   T-Score femoral neck: -1.3  T-Score lumbar spine: -0.6 -Patient is not a candidate for pharmacologic treatment -Current treatment  Vitamin D 1000 IU daily Appropriate, Effective, Safe, Accessible -Medications previously tried: none noted  -Recommend [phone] units of vitamin D daily. Recommend 1200 mg of calcium daily from dietary and supplemental sources. -Recommended to continue current medication Due for repeat bone density every two years.  COPD (Goal: control symptoms and prevent exacerbations) -Controlled -Current treatment  Breztri 160-9-4.22mcg 2 puffs twice daily Appropriate, Query effective Albuterol HFA 46mcg prn Appropriate, Effective, Safe, Accessible -Medications previously tried: Combivent  -Pulmonary function testing: Pulmonary Functions Testing Results:  No results found for: FEV1, FVC, FEV1FVC, TLC, DLCO -Exacerbations requiring treatment in last 6 months: none -Patient denies consistent use of maintenance inhaler -Frequency of rescue inhaler use: minimal -Counseled on Proper inhaler technique; Benefits of consistent maintenance inhaler use When to use rescue inhaler Differences between maintenance and rescue inhalers -Recommended to continue current medication She was not using Breztri on a daily basis for prevention and maintenance.  Part of this was due to cost.  I have applied her for [place] patient assistance and she was approved.  Will have rx sent in and patient will receive for free for this calendar year.  This will improve adherence as maintenance therapy.  GERD (Goal: Reduce symptoms) -Controlled -Current treatment  Omeprazole $RemoveBef'40mg'YrfDztCDCl$  twice daily Query Appropriate -Medications previously tried: none noted -She has tried to go off PPI with no luck.  Controls symptoms as  she is currently taking it.  -Recommended to continue current medication Work on trigger foods, no changes at this time.  Patient Goals/Self-Care Activities Patient will:  - take medications as prescribed as evidenced by patient report and record review focus on medication adherence by pill packs at [facility] check blood pressure a few times per week, document, and provide at future appointments collaborate with provider on medication access solutions  Follow Up Plan: The care management team will reach out to the patient again over the next 180 days.       Medication Assistance: Application for Breztri  medication assistance program. in process.  Anticipated assistance start date 02/09/22.  See plan of care for additional detail.  Compliance/Adherence/Medication fill history: Care Gaps: DEXA scan  Star-Rating Drugs: Atorvastatin 80 mg last filled 11/10/2021 90 DS Losartan 50 mg last filled 01/28/2022 90 DS  Patient's preferred pharmacy is:  [facility] - [place], [place] - [address] [place] [address] Phone: [phone] Fax: [phone]  Uses pill box? Yes Pt endorses 100% compliance  We discussed: Benefits of medication synchronization, packaging and delivery as well as enhanced pharmacist oversight with [facility]. Patient decided to: Utilize [facility] pharmacy for medication synchronization, packaging and delivery  Verbal consent obtained for [facility] enhanced pharmacy services (medication synchronization, adherence packaging, delivery coordination). A medication sync plan was created to allow patient to get all medications delivered once every 30 to 90 days per patient preference. Patient understands they have freedom to choose pharmacy and clinical pharmacist will coordinate care between all prescribers and [facility].  Care Plan and Follow Up Patient Decision:  Patient agrees to Care Plan and  Follow-up.  Plan: The care management team will reach out to the patient again over the next 180 days.  Beverly Milch, PharmD Clinical Pharmacist  [facility] (  [phone] ° ° ° °

## 2022-02-09 ENCOUNTER — Ambulatory Visit (INDEPENDENT_AMBULATORY_CARE_PROVIDER_SITE_OTHER): Payer: Medicare Other | Admitting: Pharmacist

## 2022-02-09 DIAGNOSIS — M8580 Other specified disorders of bone density and structure, unspecified site: Secondary | ICD-10-CM

## 2022-02-09 DIAGNOSIS — E785 Hyperlipidemia, unspecified: Secondary | ICD-10-CM

## 2022-02-09 DIAGNOSIS — I10 Essential (primary) hypertension: Secondary | ICD-10-CM

## 2022-02-09 DIAGNOSIS — J449 Chronic obstructive pulmonary disease, unspecified: Secondary | ICD-10-CM

## 2022-02-10 NOTE — Patient Instructions (Addendum)
Visit Information ? ? Goals Addressed   ? ?  ?  ?  ?  ? This Visit's Progress  ?  Improve My Heart Health-Coronary Artery Disease     ?  Timeframe:  Long-Range Goal ?Priority:  High ?Start Date:  02/10/22                           ?Expected End Date: 08/13/22                     ? ?Follow Up Date 05/13/22  ?  ?Improve LDL, continue active lifestyle ?  ?Why is this important?   ?Lifestyle changes are key to improving the blood flow to your heart. Think about the things you can change and set a goal to live healthy.  ?Remember, when the blood vessels to your heart start to get clogged you may not have any symptoms.  ?Over time, they can get worse.  ?Don't ignore the signs, like chest pain, and get help right away.   ?  ?Notes:  ?  ? ?  ? ?Patient Care Plan: General Pharmacy (Adult)  ?  ? ?Problem Identified: CAD, HTN, Nstemi, GERD, Osteopenia, HLD,   ?Priority: High  ?Onset Date: 02/10/2022  ?  ? ?Long-Range Goal: Patient-Specific Goal   ?Start Date: 02/10/2022  ?Expected End Date: 08/13/2022  ?This Visit's Progress: On track  ?Priority: High  ?Note:   ?Current Barriers:  ?Daytime sleepiness ?Inhaler copay is expensive ? ?Pharmacist Clinical Goal(s):  ?Patient will verbalize ability to afford treatment regimen ?achieve improvement in LDL as evidenced by labs through collaboration with PharmD and provider.  ? ?Interventions: ?1:1 collaboration with [name], MD regarding development and update of comprehensive plan of care as evidenced by provider attestation and co-signature ?Inter-disciplinary care team collaboration (see longitudinal plan of care) ?Comprehensive medication review performed; medication list updated in electronic medical record ? ?Hypertension (BP goal <130/80) ?-Controlled ?-Current treatment: ?Losartan '50mg'$  daily Appropriate, Effective, Safe, Accessible ?HCTZ 12.'5mg'$  Appropriate, Effective, Safe, Accessible ?Isosorbide mononitrate '30mg'$  Appropriate, Effective, Safe, Accessible ?Metoprolol tartrate '25mg'$   one and one-half tablets BID Appropriate, Effective, Safe, Accessible ?-Medications previously tried: amlodipine, hydralazine  ?-Current home readings: "normal"  no logs today ?-Current exercise habits: patient reports walking 1-2 miles per day  ?-Reports hypotensive/hypertensive symptoms - she is tired throughout the day - not to the point where she dozes off quickly but if she sits down in a chair she may fall asleep while watching TV ?-Educated on BP goals and benefits of medications for prevention of heart attack, stroke and kidney damage; ?Daily salt intake goal < 2300 mg; ?Importance of home blood pressure monitoring; ?Symptoms of hypotension and importance of maintaining adequate hydration; ?-Counseled to monitor BP at home a few times per week, document, and provide log at future appointments ?-Recommended to continue current medication ?No changes at this time - continue to monitor beta blocker dose to see if HR is controlled - this could be contributing to daytime sleepiness. ? ?Hyperlipidemia/CAD/NSTEMI: (LDL goal < 70) ?-Controlled ?-Current treatment: ?Atorvastatin '80mg'$  Appropriate, Effective, Safe, Accessible ?Zetia '10mg'$  (Just started) Appropriate, Query effective,  ?-Medications previously tried: none noted  ?-Educated on Cholesterol goals;  ?Benefits of statin for ASCVD risk reduction; ?Importance of limiting foods high in cholesterol; ?-Recommended to continue current medication ?Started Zetia after most recent LDL of 74.  Goal < 55 due to her high risk.  Recommend recheck lipids after about 3  months of Zetia.  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 ? ?Osteopenia (Goal Reduce fracture risk) ?-Controlled ?-Last DEXA Scan: 11/08/2019  ? T-Score femoral neck: -1.3 ? T-Score lumbar spine: -0.6 ?-Patient is not a candidate for pharmacologic treatment ?-Current treatment  ?Vitamin D 1000 IU daily Appropriate,  Effective, Safe, Accessible ?-Medications previously tried: none noted  ?-Recommend [phone] units of vitamin D daily. Recommend 1200 mg of calcium daily from dietary and supplemental sources. ?-Recommended to continue current medication ?Due for repeat bone density every two years. ? ?COPD (Goal: control symptoms and prevent exacerbations) ?-Controlled ?-Current treatment  ?Breztri 160-9-4.82mg 2 puffs twice daily Appropriate, Query effective ?Albuterol HFA 974m prn Appropriate, Effective, Safe, Accessible ?-Medications previously tried: Combivent  ?-Pulmonary function testing: Pulmonary Functions Testing Results: ? ?No results found for: FEV1, FVC, FEV1FVC, TLC, DLCO ?-Exacerbations requiring treatment in last 6 months: none ?-Patient denies consistent use of maintenance inhaler ?-Frequency of rescue inhaler use: minimal ?-Counseled on Proper inhaler technique; ?Benefits of consistent maintenance inhaler use ?When to use rescue inhaler ?Differences between maintenance and rescue inhalers ?-Recommended to continue current medication ?She was not using Breztri on a daily basis for prevention and maintenance.  Part of this was due to cost.  I have applied her for [place] patient assistance and she was approved.  Will have rx sent in and patient will receive for free for this calendar year.  This will improve adherence as maintenance therapy. ? ?GERD (Goal: Reduce symptoms) ?-Controlled ?-Current treatment  ?Omeprazole '40mg'$  twice daily Query Appropriate ?-Medications previously tried: none noted ?-She has tried to go off PPI with no luck.  Controls symptoms as she is currently taking it.  ?-Recommended to continue current medication ?Work on trigger foods, no changes at this time. ? ?Patient Goals/Self-Care Activities ?Patient will:  ?- take medications as prescribed as evidenced by patient report and record review ?focus on medication adherence by pill packs at [facility] ?check blood pressure a few times per week,  document, and provide at future appointments ?collaborate with provider on medication access solutions ? ?Follow Up Plan: The care management team will reach out to the patient again over the next 180 days.  ?  ? ? ?Ms. KeRizzoloas given information about Chronic Care Management services today including:  ?CCM service includes personalized support from designated clinical staff supervised by her physician, including individualized plan of care and coordination with other care providers ?24/7 contact phone numbers for assistance for urgent and routine care needs. ?Standard insurance, coinsurance, copays and deductibles apply for chronic care management only during months in which we provide at least 20 minutes of these services. Most insurances cover these services at 100%, however patients may be responsible for any copay, coinsurance and/or deductible if applicable. This service may help you avoid the need for more expensive face-to-face services. ?Only one practitioner may furnish and bill the service in a calendar month. ?The patient may stop CCM services at any time (effective at the end of the month) by phone call to the office staff. ? ?Patient agreed to services and verbal consent obtained.  ? ?The patient verbalized understanding of instructions, educational materials, and care plan provided today and agreed to receive a mailed copy of patient instructions, educational materials, and care plan.  ?Telephone follow up appointment with pharmacy team member scheduled for: 6 months ? ?ChEdythe ClarityRPUnm Children'S Psychiatric Center?ChBeverly MilchPharmD ?Clinical  Pharmacist  ?[facility] ?[phone] ? ?

## 2022-02-19 DIAGNOSIS — H903 Sensorineural hearing loss, bilateral: Secondary | ICD-10-CM | POA: Diagnosis not present

## 2022-02-23 ENCOUNTER — Other Ambulatory Visit: Payer: Self-pay

## 2022-02-23 ENCOUNTER — Ambulatory Visit
Admission: RE | Admit: 2022-02-23 | Discharge: 2022-02-23 | Disposition: A | Discharge status: Home / Self Care | Payer: Medicare Other | Source: Ambulatory Visit | Attending: Neurological Surgery | Admitting: Neurological Surgery

## 2022-02-23 DIAGNOSIS — M48061 Spinal stenosis, lumbar region without neurogenic claudication: Secondary | ICD-10-CM

## 2022-02-23 DIAGNOSIS — M4316 Spondylolisthesis, lumbar region: Secondary | ICD-10-CM | POA: Diagnosis not present

## 2022-02-23 DIAGNOSIS — M545 Low back pain, unspecified: Secondary | ICD-10-CM | POA: Diagnosis not present

## 2022-02-24 ENCOUNTER — Telehealth: Payer: Self-pay | Admitting: Family Medicine

## 2022-02-24 MED ORDER — BREZTRI AEROSPHERE 160-9-4.8 MCG/ACT IN AERO: 2.0000 | INHALATION_SPRAY | Freq: Two times a day (BID) | RESPIRATORY_TRACT | 3 refills | Status: AC

## 2022-02-24 NOTE — Telephone Encounter (Signed)
RX printed. Please fax as requested.  ?thanks ?

## 2022-02-24 NOTE — Telephone Encounter (Signed)
-----   Message from Casandra Doffing, Door sent at 02/24/2022  8:42 AM EDT ----- ?If this pt is still taking this medication can you print out and sign and I will have it faxed out. ? ?Thank you, ? ?[name] ? ?----- Message ----- ?From: Edythe Clarity, [facility] ?Sent: 02/10/2022   9:21 AM EDT ?To: Lou Cal ? ?Hey!  Patient was not using Breztri daily due to cost.  I have gotten her approved through patient assistance to receive for free. ? ?If you send a signed Rx via fax to [phone] the med should show up at her house within 10-14 business days from the time the receive that!  ? ?I will let the patient know! ? ?Thanks! ? ?Beverly Milch, PharmD ?Clinical Pharmacist  ?[facility] ?[phone] ? ? ?

## 2022-02-25 NOTE — Telephone Encounter (Signed)
Script faxed to the number provided below, Pt notified of message below. ?

## 2022-03-04 ENCOUNTER — Telehealth: Payer: Self-pay

## 2022-03-04 DIAGNOSIS — M48061 Spinal stenosis, lumbar region without neurogenic claudication: Secondary | ICD-10-CM | POA: Diagnosis not present

## 2022-03-04 NOTE — Telephone Encounter (Signed)
? ?  Pre-operative Risk Assessment  ?  ?Patient Name: Megan [name]  ?DOB: June 11, 1946 ?MRN: [id number]  ? ?  ? ?Request for Surgical Clearance   ? ?Procedure:  L 2-3 lumbar laminectomy w/intertransverse arthrodisis ? ?Date of Surgery:  Clearance TBD                              ?   ?Surgeon:  Eustace Moore  ?Surgeon's Group or Practice Name:  [facility] ?Phone number:  [phone] ?Fax number:  [phone] ?  ?Type of Clearance Requested:   ?- Medical  ?  ?Type of Anesthesia:  General  ?  ?Additional requests/questions:     ? ?Signed, ?Kathreen Devoid   ?03/04/2022, 3:35 PM  ? ?

## 2022-03-04 NOTE — Telephone Encounter (Signed)
? ?  Primary Cardiologist: [name], MD ? ?Chart reviewed as part of pre-operative protocol coverage. Given past medical history and time since last visit, based on ACC/AHA guidelines, [name] would be at acceptable risk for the planned procedure without further cardiovascular testing.  She was seen in follow-up to 623.  She remained stable from a cardiac standpoint at that time. ? ?Her RCRI is a class III risk, 6.6% risk of major cardiac event. ? ?I will route this recommendation to the requesting party via Epic fax function and remove from pre-op pool. ? ?Please call with questions. ? ?Jossie Ng. Sharlynn Seckinger NP-C ? ?  ?03/04/2022, 4:19 PM ?[place] ?[address] ?Office [phone] Fax [phone] ? ? ? ? ?

## 2022-03-05 DIAGNOSIS — E785 Hyperlipidemia, unspecified: Secondary | ICD-10-CM | POA: Diagnosis not present

## 2022-03-05 DIAGNOSIS — I10 Essential (primary) hypertension: Secondary | ICD-10-CM | POA: Diagnosis not present

## 2022-03-05 DIAGNOSIS — J449 Chronic obstructive pulmonary disease, unspecified: Secondary | ICD-10-CM | POA: Diagnosis not present

## 2022-03-05 NOTE — Telephone Encounter (Signed)
? ?  Lorriane Shire with [facility] calling back she said they also needs clearance for pt's  ? prasugrel (EFFIENT) 5 MG TABS tablet  ?To get an approval to held 7 days prior procedure  ?

## 2022-03-05 NOTE — Telephone Encounter (Signed)
Megan [name] 76 year old female is requesting preoperative cardiac evaluation for L2-3 lumbar laminectomy.  She was last seen in the clinic on 01/11/2022.  She remained stable from cardiac standpoint at that time. ? ? ?Her PMH includes CAD, HTN, hyperlipidemia, peripheral arterial disease. ? ?May her Effient be held prior to her procedure? ? ?Thank you for your help.  Please direct response to [facility] preop pool. ? ?Jossie Ng. Myquan Schaumburg NP-C ? ?  ?03/05/2022, 2:16 PM ?[place] ?[address] ?Office [phone] Fax [phone] ? ?

## 2022-03-08 DIAGNOSIS — Z961 Presence of intraocular lens: Secondary | ICD-10-CM | POA: Diagnosis not present

## 2022-03-08 DIAGNOSIS — H04123 Dry eye syndrome of bilateral lacrimal glands: Secondary | ICD-10-CM | POA: Diagnosis not present

## 2022-03-09 ENCOUNTER — Telehealth: Payer: Self-pay | Admitting: Cardiology

## 2022-03-09 ENCOUNTER — Encounter: Payer: Self-pay | Admitting: Cardiology

## 2022-03-09 NOTE — Telephone Encounter (Signed)
Ok to hold Effient for 7 days prior to the procedure.  ? ?Please inform the patient ?

## 2022-03-09 NOTE — Telephone Encounter (Signed)
Spoke to patient see telephone note. ?

## 2022-03-09 NOTE — Telephone Encounter (Signed)
Patient called to ask see if the nurse saw her mychart message. Please advise ?

## 2022-03-09 NOTE — Telephone Encounter (Signed)
Spoke to patient she stated she needs to have spinal stenosis surgery.Stated she was told ok to hold Effient for 5 days.Stated Dr.Jones wants her to hold Effient 7 days before and 3 days after.Advised I will send message to [facility] for advice. ?

## 2022-03-10 ENCOUNTER — Other Ambulatory Visit: Payer: Self-pay | Admitting: Neurological Surgery

## 2022-03-10 NOTE — Telephone Encounter (Signed)
DPR ok to leave message. Left detailed message with recommendations: ok to hold Effient x 7 days prior to procedure with Dr. Ronnald Ramp. Will resume Effient once felt safe per Dr. Ronnald Ramp. If any questions may reach out to cardiology or Dr. Ronnald Ramp office. Notes have been faxed to requesting office .  ?

## 2022-03-11 NOTE — Telephone Encounter (Signed)
Spoke to patient she stated someone already called her ok to hold Effient 7 days before back surgery and hold 3 days after. ?

## 2022-03-18 NOTE — Pre-Procedure Instructions (Signed)
Surgical Instructions ? ? ? Your procedure is scheduled on Wednesday, April 19th. ? Report to [facility] Main Entrance "A" at 10:30 A.M., then check in with the Admitting office. ? Call this number if you have problems the morning of surgery: ? [phone] ? ? If you have any questions prior to your surgery date call [phone]: Open Monday-Friday 8am-4pm ? ? ? Remember: ? Do not eat or drink after midnight the night before your surgery ? ? Take these medicines the morning of surgery with A SIP OF WATER  ?cycloSPORINE (RESTASIS)  ?ezetimibe (ZETIA)  ?gabapentin (NEURONTIN)  ?isosorbide mononitrate (IMDUR) ?omeprazole (PRILOSEC)  ? ?As needed: ?Albuterol inhaler: please bring with you to the hospital.  ?Budeson-Glycopyrrol-Formoterol (BREZTRI AEROSPHERE) ?nitroGLYCERIN (NITROSTAT)-if needed, please let a nurse know if you had to use this.  ?oxyCODONE-acetaminophen (PERCOCET) ? ?Per your cardiologist, you are to hold prasugrel (EFFIENT) for 7 days prior to surgery. ?  ?Follow your surgeon's instructions on when to stop Aspirin.  If no instructions were given by your surgeon then you will need to call the office to get those instructions.   ? ?As of today, STOP taking any Aleve, Naproxen, Ibuprofen, Motrin, Advil, Goody's, BC's, all herbal medications, fish oil, and all vitamins. ?   ? ?HOW TO MANAGE YOUR DIABETES ?BEFORE AND AFTER SURGERY ? ?Why is it important to control my blood sugar before and after surgery? ?Improving blood sugar levels before and after surgery helps healing and can limit problems. ?A way of improving blood sugar control is eating a healthy diet by: ? Eating less sugar and carbohydrates ? Increasing activity/exercise ? Talking with your doctor about reaching your blood sugar goals ?High blood sugars (greater than 180 mg/dL) can raise your risk of infections and slow your recovery, so you will need to focus on controlling your diabetes during the weeks before surgery. ?Make sure that the doctor  who takes care of your diabetes knows about your planned surgery including the date and location. ? ?How do I manage my blood sugar before surgery? ?Check your blood sugar at least 4 times a day, starting 2 days before surgery, to make sure that the level is not too high or low. ? ?Check your blood sugar the morning of your surgery when you wake up and every 2 hours until you get to the Short Stay unit. ? ?If your blood sugar is less than 70 mg/dL, you will need to treat for low blood sugar: ?Do not take insulin. ?Treat a low blood sugar (less than 70 mg/dL) with ? cup of clear juice (cranberry or apple), 4 glucose tablets, OR glucose gel. ?Recheck blood sugar in 15 minutes after treatment (to make sure it is greater than 70 mg/dL). If your blood sugar is not greater than 70 mg/dL on recheck, call [phone] for further instructions. ?Report your blood sugar to the short stay nurse when you get to Short Stay. ? ?If you are admitted to the hospital after surgery: ?Your blood sugar will be checked by the staff and you will probably be given insulin after surgery (instead of oral diabetes medicines) to make sure you have good blood sugar levels. ?The goal for blood sugar control after surgery is 80-180 mg/dL. ? ?           ?Do NOT Smoke (Tobacco/Vaping) for 24 hours prior to your procedure. ? ?If you use a CPAP at night, you may bring your mask/headgear for your overnight stay. ?  ?Contacts, glasses, piercing's, hearing  aid's, dentures or partials may not be worn into surgery, please bring cases for these belongings.  ?  ?For patients admitted to the hospital, discharge time will be determined by your treatment team. ?  ?Patients discharged the day of surgery will not be allowed to drive home, and someone needs to stay with them for 24 hours. ? ?SURGICAL WAITING ROOM VISITATION ?Patients having surgery or a procedure may have two support people in the waiting room. These visitors may be switched out with other  visitors if needed. ?Children under the age of 35 must have an adult accompany them who is not the patient. ?If the patient needs to stay at the hospital during part of their recovery, the visitor guidelines for inpatient rooms apply. ? ?Please refer to the [facility] website for the visitor guidelines for Inpatients (after your surgery is over and you are in a regular room).  ? ? ?Special instructions:   ?[facility]- Preparing For Surgery ? ?Before surgery, you can play an important role. Because skin is not sterile, your skin needs to be as free of germs as possible. You can reduce the number of germs on your skin by washing with CHG (chlorahexidine gluconate) Soap before surgery.  CHG is an antiseptic cleaner which kills germs and bonds with the skin to continue killing germs even after washing.   ? ?Oral Hygiene is also important to reduce your risk of infection.  Remember - BRUSH YOUR TEETH THE MORNING OF SURGERY WITH YOUR REGULAR TOOTHPASTE ? ?Please do not use if you have an allergy to CHG or antibacterial soaps. If your skin becomes reddened/irritated stop using the CHG.  ?Do not shave (including legs and underarms) for at least 48 hours prior to first CHG shower. It is OK to shave your face. ? ?Please follow these instructions carefully. ?  ?Shower the NIGHT BEFORE SURGERY and the MORNING OF SURGERY ? ?If you chose to wash your hair, wash your hair first as usual with your normal shampoo. ? ?After you shampoo, rinse your hair and body thoroughly to remove the shampoo. ? ?Use CHG Soap as you would any other liquid soap. You can apply CHG directly to the skin and wash gently with a scrungie or a clean washcloth.  ? ?Apply the CHG Soap to your body ONLY FROM THE NECK DOWN.  Do not use on open wounds or open sores. Avoid contact with your eyes, ears, mouth and genitals (private parts). Wash Face and genitals (private parts)  with your normal soap.  ? ?Wash thoroughly, paying special attention to the area where  your surgery will be performed. ? ?Thoroughly rinse your body with warm water from the neck down. ? ?DO NOT shower/wash with your normal soap after using and rinsing off the CHG Soap. ? ?Pat yourself dry with a CLEAN TOWEL. ? ?Wear CLEAN PAJAMAS to bed the night before surgery ? ?Place CLEAN SHEETS on your bed the night before your surgery ? ?DO NOT SLEEP WITH PETS. ? ? ?Day of Surgery: ?Take a shower with CHG soap. ?Do not wear jewelry or makeup ?Do not wear lotions, powders, perfumes, or deodorant. ?Do not shave 48 hours prior to surgery.  ?Do not bring valuables to the hospital.  ?[facility] is not responsible for any belongings or valuables. ?Do not wear nail polish, gel polish, artificial nails, or any other type of covering on natural nails (fingers and toes) ?If you have artificial nails or gel coating that need to be removed  by a nail salon, please have this removed prior to surgery. Artificial nails or gel coating may interfere with anesthesia's ability to adequately monitor your vital signs. ?Wear Clean/Comfortable clothing the morning of surgery ?Remember to brush your teeth WITH YOUR REGULAR TOOTHPASTE. ?  ?Please read over the following fact sheets that you were given. ? ? ? ?If you received a COVID test during your pre-op visit  it is requested that you wear a mask when out in public, stay away from anyone that may not be feeling well and notify your surgeon if you develop symptoms. If you have been in contact with anyone that has tested positive in the last 10 days please notify you surgeon. ? ?

## 2022-03-19 ENCOUNTER — Encounter (HOSPITAL_COMMUNITY): Payer: Self-pay

## 2022-03-19 ENCOUNTER — Other Ambulatory Visit: Payer: Self-pay

## 2022-03-19 ENCOUNTER — Encounter (HOSPITAL_COMMUNITY)
Admission: RE | Admit: 2022-03-19 | Discharge: 2022-03-19 | Disposition: A | Discharge status: Home / Self Care | Payer: Medicare Other | Source: Ambulatory Visit | Attending: Neurological Surgery | Admitting: Neurological Surgery

## 2022-03-19 ENCOUNTER — Other Ambulatory Visit (HOSPITAL_COMMUNITY): Payer: Medicare Other

## 2022-03-19 VITALS — BP 135/57 | HR 55 | Temp 97.7°F | Resp 17 | Ht 59.0 in | Wt 128.6 lb

## 2022-03-19 DIAGNOSIS — Z951 Presence of aortocoronary bypass graft: Secondary | ICD-10-CM | POA: Insufficient documentation

## 2022-03-19 DIAGNOSIS — T82856A Stenosis of peripheral vascular stent, initial encounter: Secondary | ICD-10-CM | POA: Insufficient documentation

## 2022-03-19 DIAGNOSIS — I2510 Atherosclerotic heart disease of native coronary artery without angina pectoris: Secondary | ICD-10-CM | POA: Diagnosis not present

## 2022-03-19 DIAGNOSIS — Z8673 Personal history of transient ischemic attack (TIA), and cerebral infarction without residual deficits: Secondary | ICD-10-CM | POA: Diagnosis not present

## 2022-03-19 DIAGNOSIS — Z01812 Encounter for preprocedural laboratory examination: Secondary | ICD-10-CM | POA: Diagnosis not present

## 2022-03-19 DIAGNOSIS — K769 Liver disease, unspecified: Secondary | ICD-10-CM

## 2022-03-19 DIAGNOSIS — Y838 Other surgical procedures as the cause of abnormal reaction of the patient, or of later complication, without mention of misadventure at the time of the procedure: Secondary | ICD-10-CM | POA: Diagnosis not present

## 2022-03-19 DIAGNOSIS — Z01818 Encounter for other preprocedural examination: Secondary | ICD-10-CM

## 2022-03-19 LAB — CBC
HCT: 43.3 % (ref 36.0–46.0)
Hemoglobin: 13.8 g/dL (ref 12.0–15.0)
MCH: 30.1 pg (ref 26.0–34.0)
MCHC: 31.9 g/dL (ref 30.0–36.0)
MCV: 94.5 fL (ref 80.0–100.0)
Platelets: 207 10*3/uL (ref 150–400)
RBC: 4.58 MIL/uL (ref 3.87–5.11)
RDW: 13 % (ref 11.5–15.5)
WBC: 8.3 10*3/uL (ref 4.0–10.5)
nRBC: 0 % (ref 0.0–0.2)

## 2022-03-19 LAB — TYPE AND SCREEN
ABO/RH(D): O POS
Antibody Screen: NEGATIVE

## 2022-03-19 LAB — COMPREHENSIVE METABOLIC PANEL
ALT: 25 U/L (ref 0–44)
AST: 24 U/L (ref 15–41)
Albumin: 3.3 g/dL — ABNORMAL LOW (ref 3.5–5.0)
Alkaline Phosphatase: 65 U/L (ref 38–126)
Anion gap: 5 (ref 5–15)
BUN: 11 mg/dL (ref 8–23)
CO2: 26 mmol/L (ref 22–32)
Calcium: 9.3 mg/dL (ref 8.9–10.3)
Chloride: 106 mmol/L (ref 98–111)
Creatinine, Ser: 0.73 mg/dL (ref 0.44–1.00)
GFR, Estimated: >60 mL/min (ref >60)
Glucose, Bld: 112 mg/dL — ABNORMAL HIGH (ref 70–99)
Potassium: 3.7 mmol/L (ref 3.5–5.1)
Sodium: 137 mmol/L (ref 135–145)
Total Bilirubin: 0.9 mg/dL (ref 0.3–1.2)
Total Protein: 5.7 g/dL — ABNORMAL LOW (ref 6.5–8.1)

## 2022-03-19 LAB — SURGICAL PCR SCREEN
MRSA, PCR: NEGATIVE
Staphylococcus aureus: NEGATIVE

## 2022-03-19 NOTE — Progress Notes (Signed)
PCP - Dr. Billey Chang ?Cardiologist - Dr. [name] ? ?PPM/ICD - n/a ? ?Chest x-ray - 08/27/21 ?EKG - 09/04/21 ?Stress Test - 2015 ?ECHO - 01/31/21 ?Cardiac Cath - 09/03/21 ? ?Sleep Study - Pt stated that she had a test 3 months ago that determined that she did not have OSA. ?CPAP - denies ? ?Pt states that her DM resolved in 2017. She does not take any medications for DM. Last A1C in 01/2022 was 5.8. Per Karoline Caldwell, PA-C, we are to not treat her as diabetic.  ? ?Blood Thinner Instructions: Effient; per cardiology hold 7 days pre-op and hold 3 days post-op. LD reported 03/17/22.  ?Aspirin Instructions: Per Dr. Ronnald Ramp, pt is to continue taking ASA ? ?NPO ? ?COVID TEST- n/a ? ? ?Anesthesia review: Yes, cardiac clearance received 03/04/22 via Epic. ? ?Patient denies shortness of breath, fever, cough and chest pain at PAT appointment ? ? ?All instructions explained to the patient, with a verbal understanding of the material. Patient agrees to go over the instructions while at home for a better understanding. Patient also instructed to self quarantine after being tested for COVID-19. The opportunity to ask questions was provided. ? ? ?

## 2022-03-20 ENCOUNTER — Other Ambulatory Visit: Payer: Self-pay | Admitting: Gastroenterology

## 2022-03-22 NOTE — Anesthesia Preprocedure Evaluation (Addendum)
Anesthesia Evaluation  ?Patient identified by MRN, date of birth, ID band ?Patient awake ? ? ? ?Reviewed: ?Allergy & Precautions, H&P , NPO status , Patient's Chart, lab work & pertinent test results ? ?Airway ?Mallampati: II ? ? ?Neck ROM: full ? ? ? Dental ?  ?Pulmonary ?COPD, former smoker,  ?  ?breath sounds clear to auscultation ? ? ? ? ? ? Cardiovascular ?hypertension, + angina + CAD, + Past MI, + Cardiac Stents, + CABG and + Peripheral Vascular Disease  ? ?Rhythm:regular Rate:Normal ? ? ?  ?Neuro/Psych ? Headaches,  Neuromuscular disease   ? GI/Hepatic ?GERD  ,  ?Endo/Other  ?diabetes, Type 2 ? Renal/GU ?  ? ?  ?Musculoskeletal ? ?(+) Arthritis , Fibromyalgia - ? Abdominal ?  ?Peds ? Hematology ?  ?Anesthesia Other Findings ? ? Reproductive/Obstetrics ? ?  ? ? ? ? ? ? ? ? ? ? ? ? ? ?  ?  ? ? ? ? ? ? ? ?Anesthesia Physical ?Anesthesia Plan ? ?ASA: 3 ? ?Anesthesia Plan: General  ? ?Post-op Pain Management:   ? ?Induction: Intravenous ? ?PONV Risk Score and Plan: 3 and Ondansetron, Dexamethasone and Treatment may vary due to age or medical condition ? ?Airway Management Planned: Oral ETT ? ?Additional Equipment:  ? ?Intra-op Plan:  ? ?Post-operative Plan: Extubation in OR ? ?Informed Consent: I have reviewed the patients History and Physical, chart, labs and discussed the procedure including the risks, benefits and alternatives for the proposed anesthesia with the patient or authorized representative who has indicated his/her understanding and acceptance.  ? ? ? ?Dental advisory given ? ?Plan Discussed with: CRNA, Anesthesiologist and Surgeon ? ?Anesthesia Plan Comments: (PAT note by Karoline Caldwell, PA-C: ?Follows with cardiology for history of CAD s/p CABG 1995, renal artery stent for stenosis in 2008. Admitted 2/26-02/03/21 with NSTEMI. Troponin up to 1085.  Cath showed patent LIMA to the LAD. The native RCA was occluded and she had severe sequential lesions in the SVG to RCA. This  was treated with overlapping DES. She presented to the ED on 06/25/21 with complaints of chest pain over the past 2 days.?Developed an aching sensation in the left side of her chest on Tuesday afternoon. Tried a SL nitro without relief. Also tried several reflux medications without much either. Symptoms lingered into the night and next morning she woke up with ongoing symptoms. She underwent repeat cardiac cath demonstrating restenosis of the SVG to RCA with clear evidence of prior stent fracture. She underwent repeat PCI stent post dilated to 4.0 with Irwin balloon using IVUS guidance. Of note there were no collaterals to the distal RCA.  Repeat cath 09/03/2021 showed patent stents.  Last seen by Dr. Martinique 01/11/2022, stable at that time, chest pain symptoms have resolved.  81-monthfollow-up recommended.  Preop clearance per telephone encounter 03/04/2022, "Chart reviewed as part of pre-operative protocol coverage. Given past medical history and time since last visit, based on ACC/AHA guidelines,?Megan [name]?would be at acceptable risk for the planned procedure without further cardiovascular testing.??She was seen in follow-up to 623. ?She remained stable from a cardiac standpoint at that time. Her RCRI is a class III risk, 6.6% risk of major cardiac event."  She was subsequently cleared in telephone encounter for 423 to hold Effient for 7 days prior to procedure. ?? ?Patient reports last dose Effient 03/17/2022. ? ?Follows with Dr. CDonzetta Mattersfor history of peripheral vascular disease. Status post stent graft to abdominal aortic aneurysm 2009. ?S/p iliac stent  in 2016. Stable by CT in October 2022. ? ?Preop labs reviewed, unremarkable. ? ?EKG 09/04/2021: Sinus bradycardia.  Rate 56. ? ?Cath 09/03/2021: ?1. ?Patent vein graft to PDA with minimal constraint; IVUS interrogation demonstrated a minimal stent area of 5.44m2. ?2. Patent LIMA to LAD with relatively unchanged burden of disease elsewhere. ?3. Normal  LVEDP ?? ?Recommendations: ?Medical management. ? ?TTE 01/31/2021: ??1. Left ventricular ejection fraction, by estimation, is 60 to 65%. The  ?left ventricle has normal function. The left ventricle has no regional  ?wall motion abnormalities. Left ventricular diastolic parameters are  ?consistent with Grade I diastolic  ?dysfunction (impaired relaxation).  ??2. Right ventricular systolic function is normal. The right ventricular  ?size is normal. There is normal pulmonary artery systolic pressure.  ??3. Left atrial size was severely dilated.  ??4. The mitral valve is grossly normal. Mild mitral valve regurgitation:  ?Apperance of two jets; study may underestimated regurgitation severity.  ??5. The aortic valve is tricuspid. Aortic valve regurgitation is not  ?visualized. No aortic stenosis is present.  ??6. The inferior vena cava is normal in size with greater than 50%  ?respiratory variability, suggesting right atrial pressure of 3 mmHg.  ? ?Comparison(s): A prior study was performed on 01/09/21. No significant  ?change from prior study. Prior images reviewed side by side. Similar to  ?prior.  ?)  ? ? ? ? ? ?Anesthesia Quick Evaluation ? ?

## 2022-03-22 NOTE — Progress Notes (Signed)
Anesthesia Chart Review: ? ?Follows with cardiology for history of CAD s/p CABG 1995, renal artery stent for stenosis in 2008. Admitted 2/26-02/03/21 with NSTEMI. Troponin up to 1085.  Cath showed patent LIMA to the LAD. The native RCA was occluded and she had severe sequential lesions in the SVG to RCA. This was treated with overlapping DES. She presented to the ED on 06/25/21 with complaints of chest pain over the past 2 days. Developed an aching sensation in the left side of her chest on Tuesday afternoon. Tried a SL nitro without relief. Also tried several reflux medications without much either. Symptoms lingered into the night and next morning she woke up with ongoing symptoms. She underwent repeat cardiac cath demonstrating restenosis of the SVG to RCA with clear evidence of prior stent fracture. She underwent repeat PCI stent post dilated to 4.0 with [place]ge[place] balloon using IVUS guidance. Of note there were no collaterals to the distal RCA.  Repeat cath 09/03/2021 showed patent stents.  Last seen by Dr. Martinique 01/11/2022, stable at that time, chest pain symptoms have resolved.  24-monthfollow-up recommended.  Preop clearance per telephone encounter 03/04/2022, "Chart reviewed as part of pre-operative protocol coverage. Given past medical history and time since last visit, based on ACC/AHA guidelines, Megan STILLEwould be at acceptable risk for the planned procedure without further cardiovascular testing.  She was seen in follow-up to 623.  She remained stable from a cardiac standpoint at that time. Her RCRI is a class III risk, 6.6% risk of major cardiac event."  She was subsequently cleared in telephone encounter for 423 to hold Effient for 7 days prior to procedure. ?  ?Patient reports last dose Effient 03/17/2022. ? ?Follows with Dr. CDonzetta Mattersfor history of peripheral vascular disease. Status post stent graft to abdominal aortic aneurysm 2009.  S/p iliac stent in 2016. Stable by CT in October 2022. ? ?Preop labs reviewed,  unremarkable. ? ?EKG 09/04/2021: Sinus bradycardia.  Rate 56. ? ?Cath 09/03/2021: ? Patent vein graft to PDA with minimal constraint; IVUS interrogation demonstrated a minimal stent area of 5.379m. ?Patent LIMA to LAD with relatively unchanged burden of disease elsewhere. ?Normal LVEDP ?  ?Recommendations:  Medical management. ? ?TTE 01/31/2021: ? 1. Left ventricular ejection fraction, by estimation, is 60 to 65%. The  ?left ventricle has normal function. The left ventricle has no regional  ?wall motion abnormalities. Left ventricular diastolic parameters are  ?consistent with Grade I diastolic  ?dysfunction (impaired relaxation).  ? 2. Right ventricular systolic function is normal. The right ventricular  ?size is normal. There is normal pulmonary artery systolic pressure.  ? 3. Left atrial size was severely dilated.  ? 4. The mitral valve is grossly normal. Mild mitral valve regurgitation:  ?Apperance of two jets; study may underestimated regurgitation severity.  ? 5. The aortic valve is tricuspid. Aortic valve regurgitation is not  ?visualized. No aortic stenosis is present.  ? 6. The inferior vena cava is normal in size with greater than 50%  ?respiratory variability, suggesting right atrial pressure of 3 mmHg.  ? ?Comparison(s): A prior study was performed on 01/09/21. No significant  ?change from prior study. Prior images reviewed side by side. Similar to  ?prior.  ? ? ? ?Megan CaldwellPA-C ?[facility]/Anesthesiology ?Phone ([phone]/17/2023 3:34 PM ? ?

## 2022-03-24 ENCOUNTER — Encounter (HOSPITAL_COMMUNITY)
Admission: RE | Disposition: A | Discharge status: Home / Self Care | Payer: Self-pay | Source: Ambulatory Visit | Attending: Neurological Surgery

## 2022-03-24 ENCOUNTER — Other Ambulatory Visit: Payer: Self-pay

## 2022-03-24 ENCOUNTER — Ambulatory Visit (HOSPITAL_COMMUNITY): Payer: Medicare Other

## 2022-03-24 ENCOUNTER — Encounter (HOSPITAL_COMMUNITY): Payer: Self-pay | Admitting: Neurological Surgery

## 2022-03-24 ENCOUNTER — Ambulatory Visit (HOSPITAL_BASED_OUTPATIENT_CLINIC_OR_DEPARTMENT_OTHER): Payer: Medicare Other | Admitting: Anesthesiology

## 2022-03-24 ENCOUNTER — Observation Stay (HOSPITAL_COMMUNITY)
Admission: RE | Admit: 2022-03-24 | Discharge: 2022-03-25 | Disposition: A | Discharge status: Home / Self Care | Payer: Medicare Other | Source: Ambulatory Visit | Attending: Neurological Surgery | Admitting: Neurological Surgery

## 2022-03-24 ENCOUNTER — Ambulatory Visit (HOSPITAL_COMMUNITY): Payer: Medicare Other | Admitting: Physician Assistant

## 2022-03-24 DIAGNOSIS — Z8249 Family history of ischemic heart disease and other diseases of the circulatory system: Secondary | ICD-10-CM | POA: Insufficient documentation

## 2022-03-24 DIAGNOSIS — M1990 Unspecified osteoarthritis, unspecified site: Secondary | ICD-10-CM | POA: Insufficient documentation

## 2022-03-24 DIAGNOSIS — J439 Emphysema, unspecified: Secondary | ICD-10-CM | POA: Diagnosis not present

## 2022-03-24 DIAGNOSIS — M79604 Pain in right leg: Secondary | ICD-10-CM | POA: Diagnosis not present

## 2022-03-24 DIAGNOSIS — K219 Gastro-esophageal reflux disease without esophagitis: Secondary | ICD-10-CM | POA: Insufficient documentation

## 2022-03-24 DIAGNOSIS — E119 Type 2 diabetes mellitus without complications: Secondary | ICD-10-CM | POA: Diagnosis not present

## 2022-03-24 DIAGNOSIS — I252 Old myocardial infarction: Secondary | ICD-10-CM | POA: Insufficient documentation

## 2022-03-24 DIAGNOSIS — I2510 Atherosclerotic heart disease of native coronary artery without angina pectoris: Secondary | ICD-10-CM | POA: Insufficient documentation

## 2022-03-24 DIAGNOSIS — Z833 Family history of diabetes mellitus: Secondary | ICD-10-CM | POA: Insufficient documentation

## 2022-03-24 DIAGNOSIS — Z9889 Other specified postprocedural states: Secondary | ICD-10-CM | POA: Diagnosis not present

## 2022-03-24 DIAGNOSIS — M48061 Spinal stenosis, lumbar region without neurogenic claudication: Secondary | ICD-10-CM | POA: Diagnosis not present

## 2022-03-24 DIAGNOSIS — R2689 Other abnormalities of gait and mobility: Secondary | ICD-10-CM | POA: Insufficient documentation

## 2022-03-24 DIAGNOSIS — I25119 Atherosclerotic heart disease of native coronary artery with unspecified angina pectoris: Secondary | ICD-10-CM

## 2022-03-24 DIAGNOSIS — Z87891 Personal history of nicotine dependence: Secondary | ICD-10-CM | POA: Diagnosis not present

## 2022-03-24 DIAGNOSIS — M797 Fibromyalgia: Secondary | ICD-10-CM | POA: Diagnosis not present

## 2022-03-24 DIAGNOSIS — Z7902 Long term (current) use of antithrombotics/antiplatelets: Secondary | ICD-10-CM | POA: Insufficient documentation

## 2022-03-24 DIAGNOSIS — Z7982 Long term (current) use of aspirin: Secondary | ICD-10-CM | POA: Diagnosis not present

## 2022-03-24 DIAGNOSIS — J449 Chronic obstructive pulmonary disease, unspecified: Secondary | ICD-10-CM | POA: Insufficient documentation

## 2022-03-24 DIAGNOSIS — M6281 Muscle weakness (generalized): Secondary | ICD-10-CM | POA: Insufficient documentation

## 2022-03-24 DIAGNOSIS — Z79899 Other long term (current) drug therapy: Secondary | ICD-10-CM | POA: Insufficient documentation

## 2022-03-24 DIAGNOSIS — Z8679 Personal history of other diseases of the circulatory system: Secondary | ICD-10-CM | POA: Insufficient documentation

## 2022-03-24 DIAGNOSIS — I10 Essential (primary) hypertension: Secondary | ICD-10-CM | POA: Diagnosis not present

## 2022-03-24 DIAGNOSIS — Z951 Presence of aortocoronary bypass graft: Secondary | ICD-10-CM | POA: Diagnosis not present

## 2022-03-24 DIAGNOSIS — M5416 Radiculopathy, lumbar region: Secondary | ICD-10-CM | POA: Diagnosis not present

## 2022-03-24 DIAGNOSIS — I739 Peripheral vascular disease, unspecified: Secondary | ICD-10-CM | POA: Insufficient documentation

## 2022-03-24 DIAGNOSIS — R2681 Unsteadiness on feet: Secondary | ICD-10-CM | POA: Diagnosis not present

## 2022-03-24 DIAGNOSIS — M4726 Other spondylosis with radiculopathy, lumbar region: Secondary | ICD-10-CM | POA: Insufficient documentation

## 2022-03-24 DIAGNOSIS — Z95820 Peripheral vascular angioplasty status with implants and grafts: Secondary | ICD-10-CM | POA: Insufficient documentation

## 2022-03-24 DIAGNOSIS — Z981 Arthrodesis status: Secondary | ICD-10-CM | POA: Diagnosis not present

## 2022-03-24 DIAGNOSIS — Z955 Presence of coronary angioplasty implant and graft: Secondary | ICD-10-CM | POA: Insufficient documentation

## 2022-03-24 DIAGNOSIS — M79605 Pain in left leg: Secondary | ICD-10-CM | POA: Insufficient documentation

## 2022-03-24 DIAGNOSIS — M48062 Spinal stenosis, lumbar region with neurogenic claudication: Secondary | ICD-10-CM | POA: Diagnosis not present

## 2022-03-24 DIAGNOSIS — M4326 Fusion of spine, lumbar region: Secondary | ICD-10-CM | POA: Diagnosis not present

## 2022-03-24 DIAGNOSIS — R519 Headache, unspecified: Secondary | ICD-10-CM | POA: Insufficient documentation

## 2022-03-24 DIAGNOSIS — G709 Myoneural disorder, unspecified: Secondary | ICD-10-CM | POA: Insufficient documentation

## 2022-03-24 HISTORY — PX: LAMINECTOMY WITH POSTERIOR LATERAL ARTHRODESIS LEVEL 1: SHX6335

## 2022-03-24 LAB — GLUCOSE, CAPILLARY: Glucose-Capillary: 102 mg/dL — ABNORMAL HIGH (ref 70–99)

## 2022-03-24 SURGERY — LAMINECTOMY WITH POSTERIOR LATERAL ARTHRODESIS LEVEL 1
Anesthesia: General | Site: Spine Lumbar

## 2022-03-24 MED ORDER — FENTANYL CITRATE (PF) 250 MCG/5ML IJ SOLN
INTRAMUSCULAR | Status: AC
Start: 2022-03-24 — End: ?
  Filled 2022-03-24: qty 5

## 2022-03-24 MED ORDER — CHLORHEXIDINE GLUCONATE 0.12 % MT SOLN
15.0000 mL | Freq: Once | OROMUCOSAL | Status: AC
  Administered 2022-03-24: 15 mL via OROMUCOSAL
  Filled 2022-03-24: qty 15

## 2022-03-24 MED ORDER — CEFAZOLIN SODIUM-DEXTROSE 2-4 GM/100ML-% IV SOLN
2.0000 g | INTRAVENOUS | Status: AC
  Administered 2022-03-24: 2 g via INTRAVENOUS
  Filled 2022-03-24: qty 100

## 2022-03-24 MED ORDER — ONDANSETRON HCL 4 MG/2ML IJ SOLN: 4.0000 mg | Freq: Four times a day (QID) | INTRAMUSCULAR | Status: DC | PRN

## 2022-03-24 MED ORDER — ACETAMINOPHEN 325 MG PO TABS: 650.0000 mg | ORAL_TABLET | ORAL | Status: DC | PRN

## 2022-03-24 MED ORDER — PROPOFOL 10 MG/ML IV BOLUS
INTRAVENOUS | Status: AC
  Filled 2022-03-24: qty 20

## 2022-03-24 MED ORDER — GABAPENTIN 300 MG PO CAPS
300.0000 mg | ORAL_CAPSULE | Freq: Two times a day (BID) | ORAL | Status: DC
  Administered 2022-03-24 – 2022-03-25 (×2): 300 mg via ORAL
  Filled 2022-03-24 (×2): qty 1

## 2022-03-24 MED ORDER — LIDOCAINE 2% (20 MG/ML) 5 ML SYRINGE
INTRAMUSCULAR | Status: AC
  Filled 2022-03-24: qty 5

## 2022-03-24 MED ORDER — BUDESON-GLYCOPYRROL-FORMOTEROL 160-9-4.8 MCG/ACT IN AERO: 2.0000 | INHALATION_SPRAY | Freq: Two times a day (BID) | RESPIRATORY_TRACT | Status: DC | PRN

## 2022-03-24 MED ORDER — ROCURONIUM BROMIDE 10 MG/ML (PF) SYRINGE
PREFILLED_SYRINGE | INTRAVENOUS | Status: DC | PRN
  Administered 2022-03-24: 50 mg via INTRAVENOUS

## 2022-03-24 MED ORDER — CYCLOSPORINE 0.05 % OP EMUL
1.0000 [drp] | Freq: Two times a day (BID) | OPHTHALMIC | Status: DC
  Filled 2022-03-24 (×2): qty 30

## 2022-03-24 MED ORDER — PHENOL 1.4 % MT LIQD: 1.0000 | OROMUCOSAL | Status: DC | PRN

## 2022-03-24 MED ORDER — ASPIRIN EC 81 MG PO TBEC
81.0000 mg | DELAYED_RELEASE_TABLET | Freq: Every day | ORAL | Status: DC
  Administered 2022-03-25: 81 mg via ORAL
  Filled 2022-03-24: qty 1

## 2022-03-24 MED ORDER — ACETAMINOPHEN 650 MG RE SUPP: 650.0000 mg | RECTAL | Status: DC | PRN

## 2022-03-24 MED ORDER — PANTOPRAZOLE SODIUM 40 MG PO TBEC
40.0000 mg | DELAYED_RELEASE_TABLET | Freq: Every day | ORAL | Status: DC
  Administered 2022-03-25: 40 mg via ORAL
  Filled 2022-03-24: qty 1

## 2022-03-24 MED ORDER — MORPHINE SULFATE (PF) 2 MG/ML IV SOLN
2.0000 mg | INTRAVENOUS | Status: DC | PRN
  Administered 2022-03-24: 2 mg via INTRAVENOUS
  Filled 2022-03-24: qty 1

## 2022-03-24 MED ORDER — ISOSORBIDE MONONITRATE ER 30 MG PO TB24
30.0000 mg | ORAL_TABLET | Freq: Every day | ORAL | Status: DC
  Administered 2022-03-25: 30 mg via ORAL
  Filled 2022-03-24: qty 1

## 2022-03-24 MED ORDER — METHOCARBAMOL 500 MG PO TABS
500.0000 mg | ORAL_TABLET | Freq: Four times a day (QID) | ORAL | Status: DC | PRN
  Administered 2022-03-24 – 2022-03-25 (×3): 500 mg via ORAL
  Filled 2022-03-24 (×3): qty 1

## 2022-03-24 MED ORDER — FENTANYL CITRATE (PF) 100 MCG/2ML IJ SOLN
25.0000 ug | INTRAMUSCULAR | Status: DC | PRN
  Administered 2022-03-24 (×2): 50 ug via INTRAVENOUS

## 2022-03-24 MED ORDER — SODIUM CHLORIDE 0.9% FLUSH: 3.0000 mL | INTRAVENOUS | Status: DC | PRN

## 2022-03-24 MED ORDER — ONDANSETRON HCL 4 MG/2ML IJ SOLN
INTRAMUSCULAR | Status: AC
  Filled 2022-03-24: qty 2

## 2022-03-24 MED ORDER — LOSARTAN POTASSIUM 50 MG PO TABS
50.0000 mg | ORAL_TABLET | Freq: Every day | ORAL | Status: DC
  Administered 2022-03-25: 50 mg via ORAL
  Filled 2022-03-24: qty 1

## 2022-03-24 MED ORDER — ONDANSETRON HCL 4 MG PO TABS: 4.0000 mg | ORAL_TABLET | Freq: Four times a day (QID) | ORAL | Status: DC | PRN

## 2022-03-24 MED ORDER — BUPIVACAINE HCL (PF) 0.25 % IJ SOLN
INTRAMUSCULAR | Status: DC | PRN
  Administered 2022-03-24: 6 mL

## 2022-03-24 MED ORDER — EZETIMIBE 10 MG PO TABS
10.0000 mg | ORAL_TABLET | Freq: Every day | ORAL | Status: DC
  Administered 2022-03-25: 10 mg via ORAL
  Filled 2022-03-24: qty 1

## 2022-03-24 MED ORDER — DEXAMETHASONE SODIUM PHOSPHATE 10 MG/ML IJ SOLN
INTRAMUSCULAR | Status: DC | PRN
  Administered 2022-03-24: 5 mg via INTRAVENOUS

## 2022-03-24 MED ORDER — POTASSIUM CHLORIDE IN NACL 20-0.9 MEQ/L-% IV SOLN: INTRAVENOUS | Status: DC

## 2022-03-24 MED ORDER — OXYCODONE HCL 5 MG PO TABS: 5.0000 mg | ORAL_TABLET | Freq: Once | ORAL | Status: AC | PRN

## 2022-03-24 MED ORDER — PHENYLEPHRINE 80 MCG/ML (10ML) SYRINGE FOR IV PUSH (FOR BLOOD PRESSURE SUPPORT)
PREFILLED_SYRINGE | INTRAVENOUS | Status: AC
  Filled 2022-03-24: qty 10

## 2022-03-24 MED ORDER — THROMBIN 5000 UNITS EX SOLR
CUTANEOUS | Status: DC | PRN
  Administered 2022-03-24: 5000 [IU] via TOPICAL

## 2022-03-24 MED ORDER — SENNA 8.6 MG PO TABS
1.0000 | ORAL_TABLET | Freq: Two times a day (BID) | ORAL | Status: DC
  Administered 2022-03-24 – 2022-03-25 (×2): 8.6 mg via ORAL
  Filled 2022-03-24 (×2): qty 1

## 2022-03-24 MED ORDER — ROCURONIUM BROMIDE 10 MG/ML (PF) SYRINGE
PREFILLED_SYRINGE | INTRAVENOUS | Status: AC
  Filled 2022-03-24: qty 10

## 2022-03-24 MED ORDER — ONDANSETRON HCL 4 MG/2ML IJ SOLN
INTRAMUSCULAR | Status: AC
  Filled 2022-03-24: qty 4

## 2022-03-24 MED ORDER — DEXAMETHASONE SODIUM PHOSPHATE 10 MG/ML IJ SOLN
INTRAMUSCULAR | Status: AC
  Filled 2022-03-24: qty 1

## 2022-03-24 MED ORDER — FENTANYL CITRATE (PF) 100 MCG/2ML IJ SOLN
INTRAMUSCULAR | Status: AC
Start: 2022-03-24 — End: 2022-03-25
  Filled 2022-03-24: qty 2

## 2022-03-24 MED ORDER — CEFAZOLIN SODIUM-DEXTROSE 2-4 GM/100ML-% IV SOLN
2.0000 g | Freq: Three times a day (TID) | INTRAVENOUS | Status: AC
  Administered 2022-03-24 – 2022-03-25 (×2): 2 g via INTRAVENOUS
  Filled 2022-03-24 (×2): qty 100

## 2022-03-24 MED ORDER — LACTATED RINGERS IV SOLN: INTRAVENOUS | Status: DC

## 2022-03-24 MED ORDER — 0.9 % SODIUM CHLORIDE (POUR BTL) OPTIME
TOPICAL | Status: DC | PRN
  Administered 2022-03-24: 1000 mL

## 2022-03-24 MED ORDER — OXYCODONE HCL 5 MG/5ML PO SOLN
5.0000 mg | Freq: Once | ORAL | Status: AC | PRN
  Administered 2022-03-24: 5 mg via ORAL

## 2022-03-24 MED ORDER — PHENYLEPHRINE HCL-NACL 20-0.9 MG/250ML-% IV SOLN
INTRAVENOUS | Status: DC | PRN
  Administered 2022-03-24: 15 ug/min via INTRAVENOUS

## 2022-03-24 MED ORDER — GLYCOPYRROLATE PF 0.2 MG/ML IJ SOSY
PREFILLED_SYRINGE | INTRAMUSCULAR | Status: DC | PRN
  Administered 2022-03-24: .2 mg via INTRAVENOUS

## 2022-03-24 MED ORDER — PROPOFOL 10 MG/ML IV BOLUS
INTRAVENOUS | Status: DC | PRN
  Administered 2022-03-24: 100 mg via INTRAVENOUS

## 2022-03-24 MED ORDER — OXYCODONE-ACETAMINOPHEN 5-325 MG PO TABS
1.0000 | ORAL_TABLET | ORAL | Status: DC | PRN
  Administered 2022-03-24 – 2022-03-25 (×4): 2 via ORAL
  Filled 2022-03-24 (×4): qty 2

## 2022-03-24 MED ORDER — HYDROCHLOROTHIAZIDE 12.5 MG PO TABS
12.5000 mg | ORAL_TABLET | Freq: Every day | ORAL | Status: DC
  Administered 2022-03-25: 12.5 mg via ORAL
  Filled 2022-03-24 (×2): qty 1

## 2022-03-24 MED ORDER — ALBUTEROL SULFATE (2.5 MG/3ML) 0.083% IN NEBU: 2.5000 mg | INHALATION_SOLUTION | Freq: Four times a day (QID) | RESPIRATORY_TRACT | Status: DC | PRN

## 2022-03-24 MED ORDER — LIDOCAINE 2% (20 MG/ML) 5 ML SYRINGE
INTRAMUSCULAR | Status: DC | PRN
  Administered 2022-03-24: 60 mg via INTRAVENOUS

## 2022-03-24 MED ORDER — INSULIN ASPART 100 UNIT/ML IJ SOLN: 0.0000 [IU] | Freq: Three times a day (TID) | INTRAMUSCULAR | Status: DC

## 2022-03-24 MED ORDER — DEXAMETHASONE SODIUM PHOSPHATE 10 MG/ML IJ SOLN
INTRAMUSCULAR | Status: AC
  Filled 2022-03-24: qty 2

## 2022-03-24 MED ORDER — SODIUM CHLORIDE 0.9 % IV SOLN
250.0000 mL | INTRAVENOUS | Status: DC
  Administered 2022-03-24: 250 mL via INTRAVENOUS

## 2022-03-24 MED ORDER — UMECLIDINIUM BROMIDE 62.5 MCG/ACT IN AEPB
1.0000 | INHALATION_SPRAY | Freq: Every day | RESPIRATORY_TRACT | Status: DC
  Filled 2022-03-24: qty 7

## 2022-03-24 MED ORDER — OXYCODONE HCL 5 MG/5ML PO SOLN
ORAL | Status: AC
Start: 2022-03-24 — End: 2022-03-25
  Filled 2022-03-24: qty 5

## 2022-03-24 MED ORDER — SODIUM CHLORIDE 0.9% FLUSH
3.0000 mL | Freq: Two times a day (BID) | INTRAVENOUS | Status: DC
  Administered 2022-03-24: 3 mL via INTRAVENOUS

## 2022-03-24 MED ORDER — CHLORHEXIDINE GLUCONATE CLOTH 2 % EX PADS: 6.0000 | MEDICATED_PAD | Freq: Once | CUTANEOUS | Status: DC

## 2022-03-24 MED ORDER — ORAL CARE MOUTH RINSE: 15.0000 mL | Freq: Once | OROMUCOSAL | Status: AC

## 2022-03-24 MED ORDER — FENTANYL CITRATE (PF) 250 MCG/5ML IJ SOLN
INTRAMUSCULAR | Status: DC | PRN
  Administered 2022-03-24: 50 ug via INTRAVENOUS
  Administered 2022-03-24: 25 ug via INTRAVENOUS
  Administered 2022-03-24: 100 ug via INTRAVENOUS
  Administered 2022-03-24: 25 ug via INTRAVENOUS

## 2022-03-24 MED ORDER — THROMBIN 5000 UNITS EX SOLR
CUTANEOUS | Status: AC
  Filled 2022-03-24: qty 5000

## 2022-03-24 MED ORDER — BUPIVACAINE HCL (PF) 0.25 % IJ SOLN
INTRAMUSCULAR | Status: AC
  Filled 2022-03-24: qty 30

## 2022-03-24 MED ORDER — MOMETASONE FURO-FORMOTEROL FUM 100-5 MCG/ACT IN AERO
2.0000 | INHALATION_SPRAY | Freq: Two times a day (BID) | RESPIRATORY_TRACT | Status: DC
  Filled 2022-03-24: qty 8.8

## 2022-03-24 MED ORDER — SUGAMMADEX SODIUM 200 MG/2ML IV SOLN
INTRAVENOUS | Status: DC | PRN
  Administered 2022-03-24: 200 mg via INTRAVENOUS

## 2022-03-24 MED ORDER — MENTHOL 3 MG MT LOZG: 1.0000 | LOZENGE | OROMUCOSAL | Status: DC | PRN

## 2022-03-24 MED ORDER — ONDANSETRON HCL 4 MG/2ML IJ SOLN
INTRAMUSCULAR | Status: DC | PRN
  Administered 2022-03-24: 4 mg via INTRAVENOUS

## 2022-03-24 SURGICAL SUPPLY — 52 items
BAG COUNTER SPONGE SURGICOUNT (BAG) ×2 IMPLANT
BASKET BONE COLLECTION (BASKET) ×1 IMPLANT
BENZOIN TINCTURE PRP APPL 2/3 (GAUZE/BANDAGES/DRESSINGS) ×2 IMPLANT
BLADE CLIPPER SURG (BLADE) IMPLANT
BUR CARBIDE MATCH 3.0 (BURR) ×2 IMPLANT
CANISTER SUCT 3000ML PPV (MISCELLANEOUS) ×2 IMPLANT
CLSR STERI-STRIP ANTIMIC 1/2X4 (GAUZE/BANDAGES/DRESSINGS) ×1 IMPLANT
CNTNR URN SCR LID CUP LEK RST (MISCELLANEOUS) ×1 IMPLANT
CONT SPEC 4OZ STRL OR WHT (MISCELLANEOUS) ×2
COVER BACK TABLE 60X90IN (DRAPES) ×2 IMPLANT
DERMABOND ADVANCED (GAUZE/BANDAGES/DRESSINGS) ×1
DERMABOND ADVANCED .7 DNX12 (GAUZE/BANDAGES/DRESSINGS) IMPLANT
DRAPE C-ARM 42X72 X-RAY (DRAPES) IMPLANT
DRAPE LAPAROTOMY 100X72X124 (DRAPES) ×2 IMPLANT
DRAPE SURG 17X23 STRL (DRAPES) ×2 IMPLANT
DRSG OPSITE POSTOP 3X4 (GAUZE/BANDAGES/DRESSINGS) ×1 IMPLANT
DURAPREP 26ML APPLICATOR (WOUND CARE) ×2 IMPLANT
ELECT REM PT RETURN 9FT ADLT (ELECTROSURGICAL) ×2
ELECTRODE REM PT RTRN 9FT ADLT (ELECTROSURGICAL) ×1 IMPLANT
EVACUATOR 1/8 PVC DRAIN (DRAIN) IMPLANT
GAUZE 4X4 16PLY ~~LOC~~+RFID DBL (SPONGE) IMPLANT
GLOVE BIO SURGEON STRL SZ7 (GLOVE) ×4 IMPLANT
GLOVE BIO SURGEON STRL SZ8 (GLOVE) ×3 IMPLANT
GLOVE BIOGEL PI IND STRL 7.0 (GLOVE) IMPLANT
GLOVE BIOGEL PI INDICATOR 7.0 (GLOVE) ×4
GOWN STRL REUS W/ TWL LRG LVL3 (GOWN DISPOSABLE) IMPLANT
GOWN STRL REUS W/ TWL XL LVL3 (GOWN DISPOSABLE) ×2 IMPLANT
GOWN STRL REUS W/TWL 2XL LVL3 (GOWN DISPOSABLE) IMPLANT
GOWN STRL REUS W/TWL LRG LVL3 (GOWN DISPOSABLE) ×10
GOWN STRL REUS W/TWL XL LVL3 (GOWN DISPOSABLE) ×2
GRAFT BONE PROTEIOS LRG 5CC (Orthopedic Implant) ×1 IMPLANT
HEMOSTAT POWDER KIT SURGIFOAM (HEMOSTASIS) ×1 IMPLANT
KIT BASIN OR (CUSTOM PROCEDURE TRAY) ×2 IMPLANT
KIT TURNOVER KIT B (KITS) ×2 IMPLANT
MATRIX SPINE STRIP NEOCORE 5CC (Putty) IMPLANT
NDL HYPO 25X1 1.5 SAFETY (NEEDLE) ×1 IMPLANT
NEEDLE HYPO 25X1 1.5 SAFETY (NEEDLE) ×2 IMPLANT
NS IRRIG 1000ML POUR BTL (IV SOLUTION) ×2 IMPLANT
PACK LAMINECTOMY NEURO (CUSTOM PROCEDURE TRAY) ×2 IMPLANT
PAD ARMBOARD 7.5X6 YLW CONV (MISCELLANEOUS) ×9 IMPLANT
SPONGE SURGIFOAM ABS GEL 100 (HEMOSTASIS) ×1 IMPLANT
SPONGE T-LAP 4X18 ~~LOC~~+RFID (SPONGE) IMPLANT
STRIP CLOSURE SKIN 1/2X4 (GAUZE/BANDAGES/DRESSINGS) ×4 IMPLANT
STRIP MATRIX NEOCORE 5CC (Putty) ×1 IMPLANT
SUT VIC AB 0 CT1 18XCR BRD8 (SUTURE) ×1 IMPLANT
SUT VIC AB 0 CT1 8-18 (SUTURE) ×2
SUT VIC AB 2-0 CP2 18 (SUTURE) ×2 IMPLANT
SUT VIC AB 3-0 SH 8-18 (SUTURE) ×4 IMPLANT
TOWEL GREEN STERILE (TOWEL DISPOSABLE) ×2 IMPLANT
TOWEL GREEN STERILE FF (TOWEL DISPOSABLE) ×2 IMPLANT
TRAY FOLEY MTR SLVR 16FR STAT (SET/KITS/TRAYS/PACK) IMPLANT
WATER STERILE IRR 1000ML POUR (IV SOLUTION) ×2 IMPLANT

## 2022-03-24 NOTE — Op Note (Signed)
03/24/2022 ? ?2:17 PM ? ?PATIENT:  Megan [name]  76 y.o. female ? 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 ?POST-OPERATIVE DIAGNOSIS:  same ? ?PROCEDURE:  1.  Decompressive lumbar laminectomy, medial facetectomy and foraminotomies L2-3 bilaterally, 2.  Right intertransverse arthrodesis L2-3 utilizing locally harvested morselized autologous bone graft and morselized allograft ? ?SURGEON:  Sherley Bounds, MD ? ?ASSISTANTS: Glenford Peers FNP ? ?ANESTHESIA:   General ? ?EBL: 50 ml ? ?Total I/O ?In: 100 [IV Piggyback:100] ?Out: 50 [Blood:50] ? ?BLOOD ADMINISTERED: none ? ?DRAINS: None ? ?SPECIMEN:  none ? ?INDICATION FOR PROCEDURE: This patient presented with back pain with bilateral leg pain with walking. Imaging showed near stenosis L2-3 adjacent to previous L3-L5 fusion. The patient tried conservative measures without relief. Pain was debilitating. Recommended decompressive laminectomy with noninstrumented fusion. Patient understood the risks, benefits, and alternatives and potential outcomes and wished to proceed. ? ?PROCEDURE DETAILS: The patient was taken to the operating room and after induction of adequate generalized endotracheal anesthesia, the patient was rolled into the prone position on the Wilson frame and all pressure points were padded. The lumbar region was cleaned and then prepped with DuraPrep and draped in the usual sterile fashion. 5 cc of local anesthesia was injected and then a dorsal midline incision was made and carried down to the lumbo sacral fascia. The fascia was opened and the paraspinous musculature was taken down in a subperiosteal fashion to expose L2-3 bilaterally. Intraoperative x-ray confirmed my level, and then I moved the spinous process of L2 and used a combination of the high-speed drill and the Kerrison punches to perform a hemilaminectomy, medial facetectomy, and foraminotomy at L2-3 bilaterally.  The spinous process and all  drill shavings and bone removed with the Kerrison punch was saved for later arthrodesis. the underlying yellow ligament was opened and removed in a piecemeal fashion to expose the underlying dura and exiting nerve root on each side. I undercut the lateral recess and dissected down until I was medial to and distal to the L3 pedicle. The nerve root was well decompressed bilaterally. I then palpated with a coronary dilator along the nerve root and into the foramen to assure adequate decompression. I felt no more compression of the nerve root.  We then dissected out over the right L2-3 facet and expose the transverse process of L2 and L3.  We decorticated this with a high-speed drill then placed a mixture of our local autograft with morselized allograft perform arthrodesis L2-3 on the right. I irrigated with saline solution.  Achieved hemostasis with bipolar cautery, lined the dura with Gelfoam, and then closed the fascia with 0 Vicryl. I closed the subcutaneous tissues with 2-0 Vicryl and the subcuticular tissues with 3-0 Vicryl. The skin was then closed with benzoin and Steri-Strips. The drapes were removed, a sterile dressing was applied.  My nurse practitioner was involved in the exposure, safe retraction of the neural elements, the decompressive medial facetectomy, the fusion and the closure. the patient was awakened from general anesthesia and transferred to the recovery room in stable condition. At the end of the procedure all sponge, needle and instrument counts were correct. ? ? ? ?PLAN OF CARE: Admit for overnight observation ? ?PATIENT DISPOSITION:  PACU - hemodynamically stable. ?  ?Delay start of Pharmacological VTE agent (>24hrs) due to surgical blood loss or risk of bleeding:  yes ? ? ? ? ? ? ? ? ? ? ? ? ? ? ?

## 2022-03-24 NOTE — H&P (Signed)
Subjective: ?Patient is a 76 y.o. female admitted for adjacent level stenosis. Onset of symptoms was several months ago, gradually worsening since that time.  The pain is rated severe, and is located at the across the lower back and radiates to legs. The pain is described as aching and occurs all day. The symptoms have been progressive. Symptoms are exacerbated by exercise, standing, and walking for more than a few minutes. MRI or CT showed stenosis L2-3  ? ?Past Medical History:  ?Diagnosis Date  ? AAA (abdominal aortic aneurysm) ([place])   ? a. s/p stent grafting in 2009.  ? Adenomatous colon polyp 05/2001  ? Adenomatous duodenal polyp   ? Allergy   ? Anginal pain ([place])   ? COPD (chronic obstructive pulmonary disease) ([place])   ? pt denies COPD  ? Coronary artery disease   ? a. s/p CABG in 1995;  b. 05/2013 Neg MV, EF 82%;  c. 06/2013 Cath: LM nl, LAD 40-50p, LCX nl, RCA 80p/187m VG->RCA->PDA->PLSA min irregs, LIMA->LAD atretic, vigorous LV fxn.  ? Diabetes mellitus without complication ([place]   ? history of, resolved. 2017  ? Diverticulosis   ? Eczema, dyshidrotic   ? Eczema, dyshidrotic 1986  ? Dr. HMathis Fare ? Emphysema of lung ([place]   ? Fatty liver   ? Fibromyalgia   ? GERD (gastroesophageal reflux disease)   ? Hyperlipidemia   ? Hypertension   ? Migraine   ? Myocardial infarction ([facility]   ? Osteoarthritis   ? Osteoporosis   ? unsure, having a bone scan soon  ? Peripheral arterial disease ([facility]   ? left leg diagnosed by Dr BMare Ferrari ? Renal artery stenosis ([place]   ? a. 2008 s/p PTA  ?  ?Past Surgical History:  ?Procedure Laterality Date  ? ABDOMINAL AORTAGRAM N/A 03/06/2014  ? Procedure: ABDOMINAL AORTAGRAM;  Surgeon: TRosetta Posner MD;  [facility];  Service: Cardiovascular;  Laterality: N/A;  ? ABDOMINAL AORTIC ANEURYSM REPAIR  2009  ? stenting  ? ABDOMINAL HYSTERECTOMY    ? Total, 1992  ? BREAST BIOPSY    ? CARDIAC CATHETERIZATION    ? CARDIOVASCULAR STRESS TEST  11/11/2009  ? normal study  ? CARPAL TUNNEL  RELEASE  08/1993  ? left wrist  ? CARPAL TUNNEL RELEASE  01/1997  ? right  ? CATARACT EXTRACTION, BILATERAL  2021  ? CORONARY ARTERY BYPASS GRAFT  07/1994  ? Triple by Dr ECeasar Mons ? CORONARY STENT INTERVENTION N/A 02/02/2021  ? Procedure: CORONARY STENT INTERVENTION;  Surgeon: ENelva Bush MD;  [facility];  Service: Cardiovascular;  Laterality: N/A;  ? CORONARY STENT INTERVENTION N/A 06/26/2021  ? Procedure: CORONARY STENT INTERVENTION;  Surgeon: CSherren Mocha MD;  [facility];  Service: Cardiovascular;  Laterality: N/A;  ? dental implants    ? EYE SURGERY  03/2020  ? cataract  ? HAND SURGERY Right 09/2002  ? Right hand pulley release  ? INTRAVASCULAR ULTRASOUND/IVUS N/A 06/26/2021  ? Procedure: Intravascular Ultrasound/IVUS;  Surgeon: CSherren Mocha MD;  [facility];  Service: Cardiovascular;  Laterality: N/A;  ? INTRAVASCULAR ULTRASOUND/IVUS N/A 09/03/2021  ? Procedure: Intravascular Ultrasound/IVUS;  Surgeon: TEarly Osmond MD;  [facility];  Service: Cardiovascular;  Laterality: N/A;  ? LEFT HEART CATH AND CORONARY ANGIOGRAPHY N/A 06/26/2021  ? Procedure: LEFT HEART CATH AND CORONARY ANGIOGRAPHY;  Surgeon: CSherren Mocha MD;  [facility];  Service: Cardiovascular;  Laterality: N/A;  ? LEFT HEART CATH AND CORS/GRAFTS ANGIOGRAPHY N/A 02/02/2021  ? Procedure: LEFT HEART CATH AND CORS/GRAFTS ANGIOGRAPHY;  Surgeon: Nelva Bush, MD;  Location: [place] CV LAB;  Service: Cardiovascular;  Laterality: N/A;  ? LEFT HEART CATH AND CORS/GRAFTS ANGIOGRAPHY N/A 09/03/2021  ? Procedure: LEFT HEART CATH AND CORS/GRAFTS ANGIOGRAPHY;  Surgeon: Early Osmond, MD;  Location: [place] CV LAB;  Service: Cardiovascular;  Laterality: N/A;  ? LEFT HEART CATHETERIZATION WITH CORONARY/GRAFT ANGIOGRAM N/A 06/11/2013  ? Procedure: LEFT HEART CATHETERIZATION WITH [name];  Surgeon: Thayer Headings, MD;  Location: [facility]  CATH LAB;  Service: Cardiovascular;  Laterality: N/A;  ? MULTIPLE TOOTH EXTRACTIONS  2000  ? All upper teeth.  Has full dneture.   ? RENAL ARTERY STENT  2008  ? SHOULDER ARTHROSCOPY WITH ROTATOR CUFF REPAIR Right 09/27/2018  ? Procedure: Right shoulder mini open rotator cuff repair;  Surgeon: Susa Day, MD;  Location: [facility];  Service: Orthopedics;  Laterality: Right;  90 mins  ? thyroid cyst apiration  05/1997 and 07/1998  ? TONSILECTOMY, ADENOIDECTOMY, BILATERAL MYRINGOTOMY AND TUBES  1989  ? TONSILLECTOMY    ? TOTAL ABDOMINAL HYSTERECTOMY  1992  ?  ?Prior to Admission medications   ?Medication Sig Start Date End Date Taking? Authorizing Provider  ?albuterol (VENTOLIN HFA) 108 (90 Base) MCG/ACT inhaler Inhale 2 puffs into the lungs every 6 (six) hours as needed for wheezing or shortness of breath. 01/11/22  Yes Martinique, Peter M, MD  ?ALPRAZolam Duanne Moron) 0.25 MG tablet Take 1 tablet (0.25 mg total) by mouth at bedtime as needed for sleep. 09/15/20  Yes Martinique, Peter M, MD  ?aspirin EC 81 MG tablet Take 1 tablet (81 mg total) by mouth daily. 05/09/20  Yes Martinique, Peter M, MD  ?atorvastatin (LIPITOR) 80 MG tablet TAKE 1 TABLET BY MOUTH EVERY DAY ?Patient taking differently: Take 80 mg by mouth every evening. 08/11/21  Yes Martinique, Peter M, MD  ?B Complex-Biotin-FA (B-100 COMPLEX PO) Take 1 tablet by mouth every evening.   Yes [provider]  ?bismuth subsalicylate (PEPTO BISMOL) 262 MG/15ML suspension Take 30 mLs by mouth every 6 (six) hours as needed for indigestion or diarrhea or loose stools.   Yes [provider]  ?Budeson-Glycopyrrol-Formoterol (BREZTRI AEROSPHERE) 160-9-4.8 MCG/ACT AERO Inhale 2 puffs into the lungs in the morning and at bedtime. ?Patient taking differently: Inhale 2 puffs into the lungs 2 (two) times daily as needed (SOB). 02/24/22 05/25/22 Yes [name], MD  ?cetirizine (ZYRTEC) 10 MG tablet Take 10 mg by mouth daily as needed for allergies.   Yes [provider]   ?cholecalciferol (VITAMIN D3) 25 MCG (1000 UNIT) tablet Take 1,000 Units by mouth every evening.   Yes [provider]  ?CORDRAN 4 MCG/SQCM TAPE Apply 1 each topically daily as needed (Rash).  05/17/19  Yes [provider]  ?cycloSPORINE (RESTASIS) 0.05 % ophthalmic emulsion Place 1 drop into both eyes 2 (two) times daily.    Yes [provider]  ?ezetimibe (ZETIA) 10 MG tablet Take 1 tablet (10 mg total) by mouth daily. 01/07/22 04/07/22 Yes Martinique, Peter M, MD  ?gabapentin (NEURONTIN) 300 MG capsule Take 1 capsule (300 mg total) by mouth 3 (three) times daily. ?Patient taking differently: Take 300 mg by mouth 2 (two) times daily. 09/02/21  Yes Lomax, Amy, NP  ?hydrochlorothiazide (MICROZIDE) 12.5 MG capsule Take 12.5 mg by mouth daily.   Yes [provider]  ?isosorbide mononitrate (IMDUR) 30 MG  24 hr tablet Take 3 tablets ( 90 mg ) every morning 09/02/21  Yes Martinique, Peter M, MD  ?losartan (COZAAR) 50 MG tablet Take 50 mg by mouth daily.   Yes [provider]  ?Multiple Vitamins-Minerals (MULTIVITAMIN WITH MINERALS) tablet Take 1 tablet by mouth every evening.   Yes [provider]  ?nitroGLYCERIN (NITROSTAT) 0.4 MG SL tablet Place 1 tablet (0.4 mg total) under the tongue every 5 (five) minutes as needed for chest pain. 01/09/20  Yes Martinique, Peter M, MD  ?omeprazole (PRILOSEC) 40 MG capsule Take 1 capsule (40 mg total) by mouth in the morning and at bedtime. 05/11/21  Yes Ladene Artist, MD  ?oxyCODONE-acetaminophen (PERCOCET) 10-325 MG tablet Take 1 tablet by mouth See admin instructions. Take 1 tablet twice daily, may take a third tablet as needed for pain 04/08/21  Yes [provider]  ?prasugrel (EFFIENT) 5 MG TABS tablet Take 5 mg by mouth daily.   Yes [provider]  ?PRESCRIPTION MEDICATION Place 2 puffs into both ears daily as needed (ear infections). [facility] ear powder   Yes [provider]  ?Simethicone (GAS-X PO) Take 1-2 tablets by  mouth daily as needed (gas).   Yes [provider]  ?tretinoin (RETIN-A) 0.1 % cream Apply 1 application. topically every evening.   Yes [provider]  ?Accu-Chek FastClix Lancets MISC C

## 2022-03-24 NOTE — Anesthesia Procedure Notes (Signed)
Procedure Name: Intubation ?Date/Time: 03/24/2022 12:58 PM ?Performed by: Eligha Bridegroom, CRNA ?Pre-anesthesia Checklist: Patient identified, Emergency Drugs available, Suction available, Patient being monitored and Timeout performed ?Patient Re-evaluated:Patient Re-evaluated prior to induction ?Oxygen Delivery Method: Circle system utilized ?Preoxygenation: Pre-oxygenation with 100% oxygen ?Induction Type: IV induction ?Ventilation: Mask ventilation without difficulty and Oral airway inserted - appropriate to patient size ?Laryngoscope Size: Mac and 3 ?Grade View: Grade I ?Tube type: Oral ?Tube size: 7.0 mm ?Number of attempts: 1 ?Airway Equipment and Method: Stylet ?Placement Confirmation: ETT inserted through vocal cords under direct vision and breath sounds checked- equal and bilateral ?Secured at: 20 cm ?Tube secured with: Tape ?Dental Injury: Teeth and Oropharynx as per pre-operative assessment  ? ? ? ? ?

## 2022-03-24 NOTE — Transfer of Care (Signed)
Immediate Anesthesia Transfer of Care Note ? ?Patient: Megan [name] ? ?Procedure(s) Performed: LAMINECTOMY, FORAMINOTOMY, LUMBAR TWO- THREE ; INTERTRANSVERSE FUSION LUMBAR TWO - LUMBAR THREE RIGHT (Spine Lumbar) ? ?Patient Location: PACU ? ?Anesthesia Type:General ? ?Level of Consciousness: awake, alert  and oriented ? ?Airway & Oxygen Therapy: Patient Spontanous Breathing and Patient connected to nasal cannula oxygen ? ?Post-op Assessment: Report given to RN and Post -op Vital signs reviewed and stable ? ?Post vital signs: Reviewed and stable ? ?Last Vitals:  ?Vitals Value Taken Time  ?BP 149/58 03/24/22 1430  ?Temp 36.3 ?C 03/24/22 1430  ?Pulse 66 03/24/22 1438  ?Resp 16 03/24/22 1438  ?SpO2 97 % 03/24/22 1438  ?Vitals shown include unvalidated device data. ? ?Last Pain:  ?Vitals:  ? 03/24/22 1120  ?PainSc: 4   ?   ? ?  ? ?Complications: No notable events documented. ?

## 2022-03-25 DIAGNOSIS — Z79899 Other long term (current) drug therapy: Secondary | ICD-10-CM | POA: Diagnosis not present

## 2022-03-25 DIAGNOSIS — Z8249 Family history of ischemic heart disease and other diseases of the circulatory system: Secondary | ICD-10-CM | POA: Diagnosis not present

## 2022-03-25 DIAGNOSIS — M79605 Pain in left leg: Secondary | ICD-10-CM | POA: Diagnosis not present

## 2022-03-25 DIAGNOSIS — Z955 Presence of coronary angioplasty implant and graft: Secondary | ICD-10-CM | POA: Diagnosis not present

## 2022-03-25 DIAGNOSIS — I739 Peripheral vascular disease, unspecified: Secondary | ICD-10-CM | POA: Diagnosis not present

## 2022-03-25 DIAGNOSIS — Z7982 Long term (current) use of aspirin: Secondary | ICD-10-CM | POA: Diagnosis not present

## 2022-03-25 DIAGNOSIS — M6281 Muscle weakness (generalized): Secondary | ICD-10-CM | POA: Diagnosis not present

## 2022-03-25 DIAGNOSIS — I10 Essential (primary) hypertension: Secondary | ICD-10-CM | POA: Diagnosis not present

## 2022-03-25 DIAGNOSIS — Z8679 Personal history of other diseases of the circulatory system: Secondary | ICD-10-CM | POA: Diagnosis not present

## 2022-03-25 DIAGNOSIS — J439 Emphysema, unspecified: Secondary | ICD-10-CM | POA: Diagnosis not present

## 2022-03-25 DIAGNOSIS — Z87891 Personal history of nicotine dependence: Secondary | ICD-10-CM | POA: Diagnosis not present

## 2022-03-25 DIAGNOSIS — Z981 Arthrodesis status: Secondary | ICD-10-CM | POA: Diagnosis not present

## 2022-03-25 DIAGNOSIS — E119 Type 2 diabetes mellitus without complications: Secondary | ICD-10-CM | POA: Diagnosis not present

## 2022-03-25 DIAGNOSIS — R2681 Unsteadiness on feet: Secondary | ICD-10-CM | POA: Diagnosis not present

## 2022-03-25 DIAGNOSIS — I2510 Atherosclerotic heart disease of native coronary artery without angina pectoris: Secondary | ICD-10-CM | POA: Diagnosis not present

## 2022-03-25 DIAGNOSIS — M79604 Pain in right leg: Secondary | ICD-10-CM | POA: Diagnosis not present

## 2022-03-25 DIAGNOSIS — R2689 Other abnormalities of gait and mobility: Secondary | ICD-10-CM | POA: Diagnosis not present

## 2022-03-25 DIAGNOSIS — M797 Fibromyalgia: Secondary | ICD-10-CM | POA: Diagnosis not present

## 2022-03-25 DIAGNOSIS — Z833 Family history of diabetes mellitus: Secondary | ICD-10-CM | POA: Diagnosis not present

## 2022-03-25 DIAGNOSIS — Z9889 Other specified postprocedural states: Secondary | ICD-10-CM | POA: Diagnosis not present

## 2022-03-25 DIAGNOSIS — M48061 Spinal stenosis, lumbar region without neurogenic claudication: Secondary | ICD-10-CM | POA: Diagnosis not present

## 2022-03-25 DIAGNOSIS — M1990 Unspecified osteoarthritis, unspecified site: Secondary | ICD-10-CM | POA: Diagnosis not present

## 2022-03-25 DIAGNOSIS — I252 Old myocardial infarction: Secondary | ICD-10-CM | POA: Diagnosis not present

## 2022-03-25 DIAGNOSIS — Z951 Presence of aortocoronary bypass graft: Secondary | ICD-10-CM | POA: Diagnosis not present

## 2022-03-25 MED ORDER — METHOCARBAMOL 500 MG PO TABS: 500.0000 mg | ORAL_TABLET | Freq: Four times a day (QID) | ORAL | 0 refills | Status: DC | PRN

## 2022-03-25 MED ORDER — OXYCODONE-ACETAMINOPHEN 5-325 MG PO TABS: 1.0000 | ORAL_TABLET | ORAL | 0 refills | Status: DC | PRN

## 2022-03-25 NOTE — Evaluation (Addendum)
Occupational Therapy Evaluation and Discharge ?Patient Details ?Name: Megan [name] ?MRN: [id number] ?DOB: 1946/05/10 ?Today's Date: 03/25/2022 ? ? ?History of Present Illness 76 yo female s/p L2-3 lami on 4/19. PMH including AAA, COPD, CAD, DM, fibromyalgia, HTN, MI, OA, L heart cath (2022), right shoulder rotator cuff repeat (2019), and prior back sx.  ? ?Clinical Impression ?  ?PTA, pt was living with alone and was independent; planning to have friend stay for support and assistance with IADLs. Currently, pt performing ADLs and functional mobility using SPC at Mod I level with increased time. Provided education and handout on back precautions, bed mobility, grooming, LB ADLs, toileting, and shower transfer with with seat; pt demonstrated understanding. Answered all pt questions. Recommend dc home once medically stable per physician. All acute OT needs met and will sign off. Thank you.  ?   ? ?Recommendations for follow up therapy are one component of a multi-disciplinary discharge planning process, led by the attending physician.  Recommendations may be updated based on patient status, additional functional criteria and insurance authorization.  ? ?Follow Up Recommendations ? No OT follow up  ?  ?Assistance Recommended at Discharge PRN  ?Patient can return home with the following   ? ?  ?Functional Status Assessment ? Patient has had a recent decline in their functional status and demonstrates the ability to make significant improvements in function in a reasonable and predictable amount of time.  ?Equipment Recommendations ? None recommended by OT  ?  ?Recommendations for Other Services   ? ? ?  ?Precautions / Restrictions Precautions ?Precautions: Back ?Precaution Booklet Issued: Yes (comment) ?Precaution Comments: Reviewed bakc precautions and compensatory techniques ?Required Braces or Orthoses: Other Brace ?Other Brace: No brace per MD  ? ?  ? ?Mobility Bed Mobility ?  ?  ?  ?  ?  ?  ?  ?General bed mobility  comments: Reviewed log roll ?  ? ?Transfers ?Overall transfer level: Modified independent ?Equipment used: Straight cane ?  ?  ?  ?  ?  ?  ?  ?General transfer comment: Increased time ?  ? ?  ?Balance Overall balance assessment: No apparent balance deficits (not formally assessed) [facility] for comfort) ?  ?  ?  ?  ?  ?  ?  ?  ?  ?  ?  ?  ?  ?  ?  ?  ?  ?  ?   ? ?ADL either performed or assessed with clinical judgement  ? ?ADL Overall ADL's : Modified independent ?  ?  ?  ?  ?  ?  ?  ?  ?  ?  ?  ?  ?  ?  ?  ?  ?  ?  ?  ?General ADL Comments: Pt performing ADLs and functional mobiltiy at Mod I level with increased time. Providing education and handout on back precautiuons, bed mobility, grooming, LB ADLs, toileting, and shower transfer. Pt very motivated and demonstrating good understanding  ? ? ? ?Vision Baseline Vision/History: 1 Wears glasses ?   ?   ?Perception   ?  ?Praxis   ?  ? ?Pertinent Vitals/Pain    ? ? ? ?Hand Dominance Right ?  ?Extremity/Trunk Assessment Upper Extremity Assessment ?Upper Extremity Assessment: Overall WFL for tasks assessed ?  ?Lower Extremity Assessment ?Lower Extremity Assessment: Overall WFL for tasks assessed ?  ?Cervical / Trunk Assessment ?Cervical / Trunk Assessment: Back Surgery ?  ?Communication Communication ?Communication: HOH ?  ?Cognition Arousal/Alertness: Awake/alert ?  Behavior During Therapy: [facility] for tasks assessed/performed ?Overall Cognitive Status: Within Functional Limits for tasks assessed ?  ?  ?  ?  ?  ?  ?  ?  ?  ?  ?  ?  ?  ?  ?  ?  ?  ?  ?  ?General Comments    ? ?  ?Exercises   ?  ?Shoulder Instructions    ? ? ?Home Living Family/patient expects to be discharged to:: Private residence ?Living Arrangements: Alone ?Available Help at Discharge: Friend(s);Other (Comment) (Having a friend stay with her the first few days to supervise and provide support for IADLs) ?Type of Home: Apartment ?Home Access: Elevator ?  ?  ?Home Layout: One level ?  ?  ?Bathroom Shower/Tub:  Walk-in shower ?  ?Bathroom Toilet: Standard ?  ?  ?Home Equipment: Toilet riser;Cane - single point;Rolling Walker (2 wheels);Shower seat (toilet handle bars with slight riser) ?  ?  ?  ? ?  ?Prior Functioning/Environment Prior Level of Function : Independent/Modified Independent ?  ?  ?  ?  ?  ?  ?  ?  ?  ? ?  ?  ?OT Problem List: Decreased activity tolerance;Impaired balance (sitting and/or standing);Decreased knowledge of use of DME or AE;Decreased knowledge of precautions ?  ?   ?OT Treatment/Interventions:    ?  ?OT Goals(Current goals can be found in the care plan section) Acute Rehab OT Goals ?Patient Stated Goal: Go home ?OT Goal Formulation: All assessment and education complete, DC therapy  ?OT Frequency:   ?  ? ?Co-evaluation   ?  ?  ?  ?  ? ?  ?AM-PAC OT "6 Clicks" Daily Activity     ?Outcome Measure Help from another person eating meals?: None ?Help from another person taking care of personal grooming?: None ?Help from another person toileting, which includes using toliet, bedpan, or urinal?: None ?Help from another person bathing (including washing, rinsing, drying)?: None ?Help from another person to put on and taking off regular upper body clothing?: None ?Help from another person to put on and taking off regular lower body clothing?: None ?6 Click Score: 24 ?  ?End of Session Equipment Utilized During Treatment: Other (comment) [facility]) ?Nurse Communication: Mobility status ? ?Activity Tolerance: Patient tolerated treatment well ?Patient left: in chair;with call bell/phone within reach ? ?OT Visit Diagnosis: Unsteadiness on feet (R26.81);Other abnormalities of gait and mobility (R26.89);Muscle weakness (generalized) (M62.81)  ?              ?Time: [phone] ?OT Time Calculation (min): 14 min ?Charges:  OT General Charges ?$OT Visit: 1 Visit ?OT Evaluation ?$OT Eval Low Complexity: 1 Low ? ?Oyindamola Key MSOT, OTR/L ?Acute Rehab ?Pager: [phone] ?Office: [phone] ? ?Quyen Cutsforth M  Tabius Rood ?03/25/2022, 9:48 AM ?

## 2022-03-25 NOTE — Discharge Summary (Signed)
Physician Discharge Summary  ?Patient ID: ?Megan [name] ?MRN: [id number] ?DOB/AGE: 1946/08/18 76 y.o. ? ?Admit date: 03/24/2022 ?Discharge date: 03/25/2022 ? 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   ? ? ?Discharge Diagnoses: same ? ? ?Discharged Condition: good ? ?Hospital Course: The patient was admitted on 03/24/2022 and taken to the operating room where the patient underwent laminectomy L2-3 with intertransverse arthrodesis. The patient tolerated the procedure well and was taken to the recovery room and then to the floor in stable condition. The hospital course was routine. There were no complications. The wound remained clean dry and intact. Pt had appropriate back soreness. No complaints of leg pain or new N/T/W. The patient remained afebrile with stable vital signs, and tolerated a regular diet. The patient continued to increase activities, and pain was well controlled with oral pain medications.  ? ?Consults: None ? ?Significant Diagnostic Studies:  ?Results for orders placed or performed during the hospital encounter of 03/24/22  ?Glucose, capillary  ?Result Value Ref Range  ? Glucose-Capillary 102 (H) 70 - 99 mg/dL  ? ? ?MR LUMBAR SPINE WO CONTRAST ? ?Result Date: 02/25/2022 ?CLINICAL DATA:  Lower back pain, spinal stenosis. History of lumbar spine surgery EXAM: MRI LUMBAR SPINE WITHOUT CONTRAST TECHNIQUE: Multiplanar, multisequence MR imaging of the lumbar spine was performed. No intravenous contrast was administered. COMPARISON:  Lumbar spine MRI 09/04/2020, radiographs 06/18/2020 CT abdomen/pelvis 09/18/2021 FINDINGS: Segmentation: Standard; the lowest formed disc space is designated L5-S1. This is in keeping with the prior report Alignment: There is 5 mm anterolisthesis of L4 on L5, unchanged. There is levocurvature centered at L2-L3, similar to the prior study. Vertebrae: The patient is status post posterior instrumented fusion from L3 through L5 with  interbody spacer placement. Hardware alignment is similar to the prior study, without evidence of complication. Marrow signal is normal. There is no suspicious marrow signal abnormality or marrow edema. There is mild compression deformity of the T12 and L3 vertebral bodies, unchanged. There is no evidence of new acute injury. Conus medullaris and cauda equina: Conus extends to the L1 level. Conus and cauda equina appear normal. Paraspinal and other soft tissues: A small T2 hyperintense lesion in the right kidney lower pole likely reflects a cyst. There is a 4.1 cm infrarenal abdominal aortic aneurysm, similar in size to the prior study. This was better evaluated on the CTA abdomen/pelvis of 09/18/2021. Paraspinal soft tissues are unremarkable. Disc levels: There is mild desiccation without significant loss of height at L2-L3. The other nonsurgical disc spaces are overall preserved. T12-L1: No significant spinal canal or neural foraminal stenosis. L1-L2: There is a minimal disc bulge and mild bilateral facet arthropathy without significant spinal canal or neural foraminal stenosis L2-L3: There is a diffuse disc bulge, ligamentum flavum thickening, and bilateral facet arthropathy resulting in moderate to severe spinal canal stenosis with crowding of the cauda equina nerve roots and subarticular zones and mild to moderate right and mild left neural foraminal stenosis. Findings are slightly progressed since 2021, with worsened crowding of the subarticular zones, particularly on the right. L3-L4: Status post posterior instrumented fusion without residual spinal canal stenosis. Degenerative endplate change and facet arthropathy likely causes mild left neural foraminal stenosis, suboptimally evaluated due to artifact from the hardware. No significant right neural foraminal stenosis. L4-L5: Status post posterior instrumented fusion without residual spinal canal stenosis. There is unchanged grade 1 anterolisthesis with  uncovering of the disc posteriorly with a mild superimposed bulge extending  into the left neural foramen, and degenerative endplate change and bilateral facet arthropathy resulting in severe left worse than right neural foraminal stenosis, worsened since 2021. L5-S1: Bilateral facet arthropathy without significant spinal canal or neural foraminal stenosis. IMPRESSION: 1. Postsurgical changes reflecting posterior instrumented fusion from L3 through L5 without residual spinal canal stenosis at the surgical levels. There is unchanged grade 1 anterolisthesis of L4 on L5 with associated degenerative changes resulting in severe left worse than right neural foraminal stenosis which appears progressed since 2021. 2. 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 3. Unchanged levoscoliosis centered at L2-L3. 4. Similar size of the 4.1 cm treated infrarenal abdominal aortic aneurysm, better evaluated on CTA abdomen/pelvis of 09/18/2021. Electronically Signed   By: Valetta Mole M.D.   On: 02/25/2022 11:12  ? ?DG Lumbar Spine 1 View ? ?Result Date: 03/24/2022 ?CLINICAL DATA:  Intraoperative imaging for localization EXAM: LUMBAR SPINE - 1 VIEW COMPARISON:  02/04/2022 FINDINGS: Status post posterior fusion L3 through L5 which appears unchanged from the prior exam. Surgical instrument noted posterior to the L2-L3 disc space. No acute fracture on this single view. IMPRESSION: Localization of the L2-L3 disc space. Electronically Signed   By: Merilyn Baba M.D.   On: 03/24/2022 14:42   ? ?Antibiotics:  ?Anti-infectives (From admission, onward)  ? ? Start     Dose/Rate Route Frequency Ordered Stop  ? 03/24/22 2100  ceFAZolin (ANCEF) IVPB 2g/100 mL premix       ? 2 g ?200 mL/hr over 30 Minutes Intravenous Every 8 hours 03/24/22 1527 03/25/22 0633  ? 03/24/22  1045  ceFAZolin (ANCEF) IVPB 2g/100 mL premix       ? 2 g ?200 mL/hr over 30 Minutes Intravenous On call to O.R. 03/24/22 1035 03/24/22 1333  ? ?  ? ? ?Discharge Exam: ?Blood pressure (!) 147/64, pulse 70, temperature 98.4 ?F (36.9 ?C), temperature source Oral, resp. rate 20, height '4\' 11"'$  (1.499 m), weight 56.7 kg, SpO2 93 %. ?Neurologic: Grossly normal ?Ambulating and voiding well incision cdi  ? ?Discharge Medications:   ?Allergies as of 03/25/2022   ? ?   Reactions  ? Brilinta [ticagrelor] Shortness Of Breath  ? Bextra [valdecoxib] Other (See Comments)  ? Increased lft's  ? Hydrocod Poli-chlorphe Poli Er Rash  ? Lipitor [atorvastatin] Other (See Comments)  ? Leg weakness  ? Mevacor [lovastatin] Other (See Comments)  ? Muscle weakness  ? Plavix [clopidogrel Bisulfate] Itching, Other (See Comments)  ? Hands and feet tingle and itch  ? Zocor [simvastatin] Other (See Comments)  ? Bones hurt  ? Doxycycline Diarrhea, Nausea And Vomiting  ? Metoprolol Other (See Comments)  ? Hair loss  ? Morphine And Related Itching, Nausea Only  ? Codeine Itching, Rash  ? Hyper-active  ? Lisinopril Cough  ? ?  ? ?  ?Medication List  ?  ? ?STOP taking these medications   ? ?oxyCODONE-acetaminophen 10-325 MG tablet ?Commonly known as: PERCOCET ?Replaced by: oxyCODONE-acetaminophen 5-325 MG tablet ?  ? ?  ? ?TAKE these medications   ? ?Accu-Chek FastClix Lancets Misc ?Check blood sugar once daily PRN ?  ?Accu-Chek SmartView test strip ?Generic drug: glucose blood ?CHECK BLOOD SUGAR ONCE DAILY AS NEEDED ?  ?albuterol 108 (90 Base) MCG/ACT inhaler ?Commonly known as: VENTOLIN HFA ?Inhale 2 puffs into the lungs every 6 (six) hours  as needed for wheezing or shortness of breath. ?  ?ALPRAZolam 0.25 MG tablet ?Commonly known as: Duanne Moron ?Take 1 tablet (0.25 mg total) by mouth at bedtime as needed for sleep. ?  ?aspirin EC 81 MG tablet ?Take 1 tablet (81 mg total) by mouth daily. ?  ?atorvastatin 80 MG tablet ?Commonly known as: LIPITOR ?TAKE 1  TABLET BY MOUTH EVERY DAY ?What changed: when to take this ?  ?B-100 COMPLEX PO ?Take 1 tablet by mouth every evening. ?  ?bismuth subsalicylate 868 HK/88NG suspension ?Commonly known as: PEPTO BISMOL ?Take 30

## 2022-03-25 NOTE — Anesthesia Postprocedure Evaluation (Signed)
Anesthesia Post Note ? ?Patient: Megan [name] ? ?Procedure(s) Performed: LAMINECTOMY, FORAMINOTOMY, LUMBAR TWO- THREE ; INTERTRANSVERSE FUSION LUMBAR TWO - LUMBAR THREE RIGHT (Spine Lumbar) ? ?  ? ?Patient location during evaluation: PACU ?Anesthesia Type: General ?Level of consciousness: awake and alert ?Pain management: pain level controlled ?Vital Signs Assessment: post-procedure vital signs reviewed and stable ?Respiratory status: spontaneous breathing, nonlabored ventilation, respiratory function stable and patient connected to nasal cannula oxygen ?Cardiovascular status: blood pressure returned to baseline and stable ?Postop Assessment: no apparent nausea or vomiting ?Anesthetic complications: no ? ? ?No notable events documented. ? ?Last Vitals:  ?Vitals:  ? 03/25/22 0344 03/25/22 0730  ?BP: (!) 110/57 (!) 147/64  ?Pulse: 64 70  ?Resp: 18 20  ?Temp: 36.7 ?C 36.9 ?C  ?SpO2: 95% 93%  ?  ?Last Pain:  ?Vitals:  ? 03/25/22 0730  ?TempSrc: Oral  ?PainSc:   ? ? ?  ?  ?  ?  ?  ?  ? ?[place] S ? ? ? ? ?

## 2022-03-29 ENCOUNTER — Encounter (HOSPITAL_COMMUNITY): Payer: Self-pay | Admitting: Neurological Surgery

## 2022-03-30 DIAGNOSIS — M461 Sacroiliitis, not elsewhere classified: Secondary | ICD-10-CM | POA: Diagnosis not present

## 2022-03-30 DIAGNOSIS — Z9889 Other specified postprocedural states: Secondary | ICD-10-CM | POA: Diagnosis not present

## 2022-03-31 ENCOUNTER — Encounter (HOSPITAL_COMMUNITY): Payer: Self-pay | Admitting: Neurological Surgery

## 2022-04-05 DIAGNOSIS — E785 Hyperlipidemia, unspecified: Secondary | ICD-10-CM | POA: Diagnosis not present

## 2022-04-05 DIAGNOSIS — I25708 Atherosclerosis of coronary artery bypass graft(s), unspecified, with other forms of angina pectoris: Secondary | ICD-10-CM | POA: Diagnosis not present

## 2022-04-05 LAB — LIPID PANEL
Chol/HDL Ratio: 2.8 ratio (ref 0.0–4.4)
Cholesterol, Total: 142 mg/dL (ref 100–199)
HDL: 51 mg/dL (ref >39)
LDL Chol Calc (NIH): 57 mg/dL (ref 0–99)
Triglycerides: 209 mg/dL — ABNORMAL HIGH (ref 0–149)
VLDL Cholesterol Cal: 34 mg/dL (ref 5–40)

## 2022-04-05 LAB — HEPATIC FUNCTION PANEL
ALT: 16 IU/L (ref 0–32)
AST: 18 IU/L (ref 0–40)
Albumin: 3.7 g/dL (ref 3.7–4.7)
Alkaline Phosphatase: 74 IU/L (ref 44–121)
Bilirubin Total: 0.4 mg/dL (ref 0.0–1.2)
Bilirubin, Direct: 0.16 mg/dL (ref 0.00–0.40)
Total Protein: 5.3 g/dL — ABNORMAL LOW (ref 6.0–8.5)

## 2022-04-15 ENCOUNTER — Other Ambulatory Visit: Payer: Self-pay | Admitting: Cardiology

## 2022-04-29 ENCOUNTER — Other Ambulatory Visit: Payer: Self-pay | Admitting: Neurological Surgery

## 2022-04-29 DIAGNOSIS — M25551 Pain in right hip: Secondary | ICD-10-CM

## 2022-04-29 DIAGNOSIS — Z9889 Other specified postprocedural states: Secondary | ICD-10-CM | POA: Diagnosis not present

## 2022-05-02 ENCOUNTER — Other Ambulatory Visit: Payer: Self-pay | Admitting: General Practice

## 2022-05-02 ENCOUNTER — Other Ambulatory Visit: Payer: Self-pay | Admitting: Cardiology

## 2022-05-02 ENCOUNTER — Other Ambulatory Visit: Payer: Self-pay | Admitting: Gastroenterology

## 2022-05-10 ENCOUNTER — Ambulatory Visit
Admission: RE | Admit: 2022-05-10 | Discharge: 2022-05-10 | Disposition: A | Discharge status: Home / Self Care | Payer: Medicare Other | Source: Ambulatory Visit | Attending: Neurological Surgery | Admitting: Neurological Surgery

## 2022-05-10 DIAGNOSIS — M25551 Pain in right hip: Secondary | ICD-10-CM | POA: Diagnosis not present

## 2022-05-14 ENCOUNTER — Other Ambulatory Visit: Payer: Medicare Other

## 2022-05-18 DIAGNOSIS — M7061 Trochanteric bursitis, right hip: Secondary | ICD-10-CM | POA: Diagnosis not present

## 2022-05-18 DIAGNOSIS — M7062 Trochanteric bursitis, left hip: Secondary | ICD-10-CM | POA: Diagnosis not present

## 2022-05-23 ENCOUNTER — Other Ambulatory Visit: Payer: Self-pay | Admitting: Gastroenterology

## 2022-05-23 ENCOUNTER — Other Ambulatory Visit: Payer: Self-pay | Admitting: General Practice

## 2022-05-25 ENCOUNTER — Telehealth: Payer: Self-pay | Admitting: Gastroenterology

## 2022-05-25 MED ORDER — OMEPRAZOLE 40 MG PO CPDR: 40.0000 mg | DELAYED_RELEASE_CAPSULE | Freq: Two times a day (BID) | ORAL | 0 refills | Status: DC

## 2022-05-25 NOTE — Telephone Encounter (Signed)
Informed patient she is due for follow up visit and we can send a refill to her pharmacy. Appt scheduled for 07/05/22 at 1:50pm. Prescription sent to patients pharmacy.

## 2022-05-25 NOTE — Telephone Encounter (Signed)
Patient called and requested a refill of her Prilosec.  Please call patient when order has been placed.  Thank you.

## 2022-06-17 DIAGNOSIS — M7061 Trochanteric bursitis, right hip: Secondary | ICD-10-CM | POA: Diagnosis not present

## 2022-06-17 DIAGNOSIS — Z9889 Other specified postprocedural states: Secondary | ICD-10-CM | POA: Diagnosis not present

## 2022-06-21 ENCOUNTER — Ambulatory Visit (INDEPENDENT_AMBULATORY_CARE_PROVIDER_SITE_OTHER): Payer: Medicare Other | Admitting: Family Medicine

## 2022-06-21 ENCOUNTER — Encounter: Payer: Self-pay | Admitting: Family Medicine

## 2022-06-21 VITALS — BP 154/82 | HR 77 | Temp 97.8°F | Ht 59.0 in | Wt 128.2 lb

## 2022-06-21 DIAGNOSIS — I10 Essential (primary) hypertension: Secondary | ICD-10-CM | POA: Diagnosis not present

## 2022-06-21 DIAGNOSIS — R7301 Impaired fasting glucose: Secondary | ICD-10-CM

## 2022-06-21 DIAGNOSIS — I25708 Atherosclerosis of coronary artery bypass graft(s), unspecified, with other forms of angina pectoris: Secondary | ICD-10-CM

## 2022-06-21 DIAGNOSIS — E785 Hyperlipidemia, unspecified: Secondary | ICD-10-CM | POA: Diagnosis not present

## 2022-06-21 DIAGNOSIS — Z0000 Encounter for general adult medical examination without abnormal findings: Secondary | ICD-10-CM | POA: Diagnosis not present

## 2022-06-21 LAB — POCT GLYCOSYLATED HEMOGLOBIN (HGB A1C): Hemoglobin A1C: 5.8 % — AB (ref 4.0–5.6)

## 2022-06-21 MED ORDER — LOSARTAN POTASSIUM 100 MG PO TABS: 100.0000 mg | ORAL_TABLET | Freq: Every day | ORAL | 3 refills | Status: DC

## 2022-06-21 NOTE — Patient Instructions (Signed)
Please return in 6 months for recheck ° °If you have any questions or concerns, please don't hesitate to send me a message via MyChart or call the office at [phone]. Thank you for visiting with us today! It's our pleasure caring for you.  °

## 2022-06-21 NOTE — Progress Notes (Signed)
Subjective  Chief Complaint  Patient presents with   Annual Exam    Pt here for annual exam and is currently fasting. Pt stated that she has been SOB for the past week.    HPI: Megan [name] is a 76 y.o. female who presents to [facility] today for a Female Wellness Visit. She also has the concerns and/or needs as listed above in the chief complaint. These will be addressed in addition to the Health Maintenance Visit.   Wellness Visit: annual visit with health maintenance review and exam without Pap  HM: screens and imms are current. Doing well Chronic disease f/u and/or acute problem visit: (deemed necessary to be done in addition to the wellness visit): CAD: medical mgt only. Some possible angina with SOB last week. Resolved. On imdur. No CHF sxs. Reviewed cardiology notes HLD at goal on statin.  IFG: eats well. No sxs of hyperglycemia.  HTN: borderline high today. Reports home readings are high as well. On losartan 50 and hctz 12.5. no BB  Assessment  1. Annual physical exam   2. Coronary artery disease of bypass graft of native heart with stable angina pectoris ([place])   3. Hyperlipidemia LDL goal <70   4. IFG (impaired fasting glucose)   5. Essential (primary) hypertension      Plan  Female Wellness Visit: Age appropriate Health Maintenance and Prevention measures were discussed with patient. Included topics are cancer screening recommendations, ways to keep healthy (see AVS) including dietary and exercise recommendations, regular eye and dental care, use of seat belts, and avoidance of moderate alcohol use and tobacco use.  BMI: discussed patient's BMI and encouraged positive lifestyle modifications to help get to or maintain a target BMI. HM needs and immunizations were addressed and ordered. See below for orders. See HM and immunization section for updates. Routine labs and screening tests ordered including cmp, cbc and lipids where  appropriate. Discussed recommendations regarding Vit D and calcium supplementation (see AVS)  Chronic disease management visit and/or acute problem visit: CAD: monitor closely. To Cards if recurs or worsens. Continue asa, imdur HLD at goal on lipitor 80 nightly. HTN: uncontrolled. Increase losartan to '100mg'$  daily. Continue hctz 12.5 daily.  IFG: controlled with nl a1c today.   Follow up: 6 mo for recheck  Orders Placed This Encounter  Procedures   POCT glycosylated hemoglobin (Hb A1C)   Meds ordered this encounter  Medications   losartan (COZAAR) 100 MG tablet    Sig: Take 1 tablet (100 mg total) by mouth daily.    Dispense:  90 tablet    Refill:  3      Body mass index is 25.89 kg/m. 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    Coronary artery disease     Priority: High    a. s/p CABG in 1995;  b. 05/2013 Neg MV,  EF 82%;  c. 06/2013 Cath: LM nl, LAD 40-50p, LCX nl, RCA 80p/19m VG->RCA->PDA->PLSA min irregs, LIMA->LAD atretic, vigorous LV fxn.    Peripheral vascular disease with claudication (HCC)     Priority: High   History of abdominal aortic aneurysm repair 05/18/2011    Priority: High    Stent graft 2009     Hyperlipidemia LDL goal <70     Priority: High   History of compression fracture of spine - lumbar 06/19/2021    Priority: Medium    History of lumbosacral spine surgery 03/03/2021    Priority: Medium    Neuropathic pain 12/05/2020    Priority:  Medium    Gait instability 09/09/2020    Priority: Medium    Lumbar stenosis without neurogenic claudication 02/26/2020    Priority: Medium    DJD (degenerative joint disease), lumbosacral 11/10/2017    Priority: Medium    Fibromyalgia 11/10/2017    Priority: Medium    GERD (gastroesophageal reflux disease) 11/10/2017    Priority: Medium    Fatty liver 11/10/2017    Priority: Medium    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    Priority: Medium     Severe by colonoscopy 09/2014, Dr. SFuller Plan   History of renal artery stenosis 05/18/2011    Priority: Medium     Right renal artery stent 2008    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/26/2021    Priority: Low   Dyshidrotic eczema 11/10/2017    Priority: Low   Osteopenia 11/10/2017    Priority: Low    DEXA 10/2019, T = -1.3 lowest, osteopenia. Recheck 2-3 years.  Last Dexa 2009; declines further testing; does not tolerate biphosphanates    Does use hearing aid 11/10/2017    Priority: Low   S/P lumbar laminectomy 03/24/2022   Thyroid nodule greater than or equal to 1.5 cm in diameter incidentally noted on imaging study 08/27/2021    FNA 11/2021 endocrine    Health Maintenance  Topic Date Due   INFLUENZA VACCINE  07/06/2022   TETANUS/TDAP  08/09/2022   MAMMOGRAM  12/18/2022   COLONOSCOPY (Pts [phone] coverage will need to be confirmed)  11/04/2024   Pneumonia Vaccine 6573Years old  Completed   COVID-19 Vaccine  Completed   Hepatitis C Screening  Completed   Zoster Vaccines- Shingrix  Completed   HPV VACCINES  Aged Out   Immunization History  Administered Date(s) Administered   Fluad Quad(high Dose 65+) 07/30/2019   Influenza, High Dose Seasonal PF 08/13/2017, 08/26/2018, 09/15/2020   Influenza, Seasonal, Injecte, Preservative Fre 09/21/2014, 09/06/2015, 07/21/2016   Influenza,inj,quad, With Preservative 08/06/2017   Influenza-Unspecified 08/10/2017, 10/13/2021   PFIZER(Purple Top)SARS-COV-2 Vaccination  12/22/2019, 01/12/2020, 09/15/2020   PPD Test 09/13/1979, 03/10/2021   Pfizer Covid-19 Vaccine Bivalent Booster 1216yr up 10/13/2021   Pneumococcal Conjugate-13 12/31/2016   Pneumococcal Polysaccharide-23 12/16/2014, 08/01/2019   Td 05/13/2008   Tdap 08/09/2012   Zoster Recombinat (Shingrix) 01/13/2018, 03/14/2018   Zoster, Live 11/03/2010   We updated and reviewed the patient's past history in detail and it is documented below. Allergies: Patient is allergic to brilinta [ticagrelor], bextra [valdecoxib], hydrocod poli-chlorphe poli er, lipitor [atorvastatin], mevacor [lovastatin], plavix [clopidogrel bisulfate], zocor [simvastatin], doxycycline, metoprolol, morphine and related, codeine, and lisinopril. Past Medical History Patient  has a past medical history of AAA (abdominal aortic aneurysm) (HCC[place]ardville[place]denomatous colon polyp (05/2001), Adenomatous duodenal polyp, Allergy, Anginal pain (HCCFalls ChurchCOPD (chronic obstructive pulmonary disease) (HCC[place]Coronary artery disease, Diabetes mellitus without complication (HCC[place]ankatoDiverticulosis, Eczema, dyshidrotic,  Eczema, dyshidrotic (1986), Emphysema of lung ([place]), Fatty liver, Fibromyalgia, GERD (gastroesophageal reflux disease), Hyperlipidemia, Hypertension, Migraine, Myocardial infarction ([place]), Osteoarthritis, Osteoporosis, Peripheral arterial disease ([place]), and Renal artery stenosis ([place]). Past Surgical History Patient  has a past surgical history that includes Renal artery stent (2008); Coronary artery bypass graft (07/1994); Carpal tunnel release (08/1993); Tonsilectomy, adenoidectomy, bilateral myringotomy and tubes (1989); Total abdominal hysterectomy (1992); Carpal tunnel release (01/1997); thyroid cyst apiration (05/1997 and 07/1998); Cardiovascular stress test (11/11/2009); Cardiac catheterization; Abdominal aortic aneurysm repair (2009); left heart catheterization with coronary/graft angiogram (N/A, 06/11/2013); abdominal aortagram (N/A, 03/06/2014); Hand  surgery (Right, 09/2002); Multiple tooth extractions (2000); Abdominal hysterectomy; dental implants; Breast biopsy; Shoulder arthroscopy with rotator cuff repair (Right, 09/27/2018); Cataract extraction, bilateral (2021); Tonsillectomy; Eye surgery (03/2020); LEFT HEART CATH AND CORS/GRAFTS ANGIOGRAPHY (N/A, 02/02/2021); CORONARY STENT INTERVENTION (N/A, 02/02/2021); LEFT HEART CATH AND CORONARY ANGIOGRAPHY (N/A, 06/26/2021); Intravascular Ultrasound/IVUS (N/A, 06/26/2021); CORONARY STENT INTERVENTION (N/A, 06/26/2021); LEFT HEART CATH AND CORS/GRAFTS ANGIOGRAPHY (N/A, 09/03/2021); Intravascular Ultrasound/IVUS (N/A, 09/03/2021); and Laminectomy with posterior lateral arthrodesis level 1 (N/A, 03/24/2022). Family History: Patient family history includes AAA (abdominal aortic aneurysm) in her brother; Breast cancer in her sister; Colon cancer in her cousin and paternal grandmother; Diabetes in her brother and sister; Heart attack in her brother, father, mother, and sister; Heart disease in her brother, brother, father, mother, and sister; Hyperlipidemia in her brother and sister; Hypertension in her brother and sister; Liver cancer in her sister. Social History:  Patient  reports that she quit smoking about 26 years ago. Her smoking use included cigarettes. She has never used smokeless tobacco. She reports current alcohol use of about 6.0 - 10.0 standard drinks of alcohol per week. She reports that she does not use drugs.  Review of Systems: Constitutional: negative for fever or malaise Ophthalmic: negative for photophobia, double vision or loss of vision Cardiovascular: negative for chest pain, dyspnea on exertion, or new LE swelling Respiratory: negative for SOB or persistent cough Gastrointestinal: negative for abdominal pain, change in bowel habits or melena Genitourinary: negative for dysuria or gross hematuria, no abnormal uterine bleeding or disharge Musculoskeletal: negative for new gait disturbance or  muscular weakness Integumentary: negative for new or persistent rashes, no breast lumps Neurological: negative for TIA or stroke symptoms Psychiatric: negative for SI or delusions Allergic/Immunologic: negative for hives  Patient Care Team    Relationship Specialty Notifications Start End  [name], MD PCP - General Family Medicine  05/21/13   Martinique, Peter M, MD PCP - Cardiology Cardiology Admissions 05/08/20   Martinique, Peter M, MD Consulting Physician Cardiology  05/24/17   Vicie Mutters, MD Consulting Physician Otolaryngology  11/10/17   Ladene Artist, MD Consulting Physician Gastroenterology  11/10/17   Penni Bombard, MD Consulting Physician Neurology  11/10/17   Reynold Bowen, MD Consulting Physician Endocrinology  11/10/17   Sharyne Peach, MD Consulting Physician Ophthalmology  11/10/17   Renelda Mom, DMD Consulting Physician Dentistry  11/10/17   Rolm Bookbinder, MD Consulting Physician Dermatology  01/12/18   Manson Passey, [facility]  Specialist  01/12/18   Woodfin Ganja  Dentistry  01/25/19   Edythe Clarity, [facility] Pharmacist Pharmacist  01/11/22    Comment: [phone]    Objective  Vitals: BP (!) 154/82   Pulse 77   Temp 97.8 F (36.6 C)   Ht '4\' 11"'$  (1.499 m)   Wt 128 lb 3.2 oz (58.2 kg)   SpO2 (!) 89%   BMI 25.89 kg/m  General:  Well developed,  well nourished, no acute distress  Psych:  Alert and orientedx3,normal mood and affect HEENT:  Normocephalic, atraumatic, non-icteric sclera,  supple neck without adenopathy, mass or thyromegaly Cardiovascular:  Normal S1, S2, RRR with murmur Respiratory:  Good breath sounds bilaterally, CTAB with normal respiratory effort Gastrointestinal: normal bowel sounds, soft, non-tender, no noted masses. No HSM MSK: no deformities, contusions. Joints are without erythema or swelling.  Skin:  Warm, no rashes or suspicious lesions noted  Lab Results  Component Value Date   HGBA1C 5.8 (A) 06/21/2022   Lab Results  Component Value Date   CHOL 142  04/05/2022   CHOL 148 01/06/2022   CHOL 142 06/25/2021   Lab Results  Component Value Date   HDL 51 04/05/2022   HDL 52 01/06/2022   HDL 49 06/25/2021   Lab Results  Component Value Date   LDLCALC 57 04/05/2022   LDLCALC 74 01/06/2022   LDLCALC 73 06/25/2021   Lab Results  Component Value Date   TRIG 209 (H) 04/05/2022   TRIG 125 01/06/2022   TRIG 100 06/25/2021   Lab Results  Component Value Date   CHOLHDL 2.8 04/05/2022   CHOLHDL 2.8 01/06/2022   CHOLHDL 2.9 06/25/2021   Lab Results  Component Value Date   LDLDIRECT 82.0 06/13/2020   LDLDIRECT 122.0 01/25/2019   LDLDIRECT 87.4 12/09/2014   Lab Results  Component Value Date   CREATININE 0.73 03/19/2022   BUN 11 03/19/2022   NA 137 03/19/2022   K 3.7 03/19/2022   CL 106 03/19/2022   CO2 26 03/19/2022    Commons side effects, risks, benefits, and alternatives for medications and treatment plan prescribed today were discussed, and the patient expressed understanding of the given instructions. Patient is instructed to call or message via MyChart if he/she has any questions or concerns regarding our treatment plan. No barriers to understanding were identified. We discussed Red Flag symptoms and signs in detail. Patient expressed understanding regarding what to do in case of urgent or emergency type symptoms.  Medication list was reconciled, printed and provided to the patient in AVS. Patient instructions and summary information was reviewed with the patient as documented in the AVS. This note was prepared with assistance of Dragon voice recognition software. Occasional wrong-word or sound-a-like substitutions may have occurred due to the inherent limitations of voice recognition software  This visit occurred during the SARS-CoV-2 public health emergency.  Safety protocols were in place, including screening questions prior to the visit, additional usage of staff PPE, and extensive cleaning of exam room while observing  appropriate contact time as indicated for disinfecting solutions.

## 2022-06-23 ENCOUNTER — Encounter: Payer: Self-pay | Admitting: Family Medicine

## 2022-06-28 ENCOUNTER — Encounter: Payer: Self-pay | Admitting: Cardiology

## 2022-06-28 ENCOUNTER — Telehealth: Payer: Self-pay | Admitting: Cardiology

## 2022-06-28 NOTE — Telephone Encounter (Signed)
Patient is requesting to speak with Dr. Doug Sou nurse, Malachy Mood. She states she just has a few questions and will discuss further with LPN.

## 2022-06-28 NOTE — Telephone Encounter (Signed)
Spoke to patient she stated she saw PCP last week.B/P medication was increased.Stated she started having sob off and on last Thursday.Stated this past Saturday her chest felt achy.Yesterday she continued to have chest discomfort and sob off and on.No chest discomfort at present.She requested appointment with Dr.Jordan.Advised Dr.Jordan is out of office this week.Appointment scheduled with Laurann Montana NP 7/25 at 2:45 pm at [facility] location.Advised to go to ED if needed.

## 2022-06-28 NOTE — Telephone Encounter (Signed)
See telephone note from nurse today 07/24.  Thanks!

## 2022-06-29 ENCOUNTER — Ambulatory Visit (HOSPITAL_BASED_OUTPATIENT_CLINIC_OR_DEPARTMENT_OTHER): Payer: Medicare Other | Admitting: Family

## 2022-06-29 ENCOUNTER — Encounter (HOSPITAL_BASED_OUTPATIENT_CLINIC_OR_DEPARTMENT_OTHER): Payer: Self-pay | Admitting: Family

## 2022-06-29 ENCOUNTER — Ambulatory Visit (HOSPITAL_BASED_OUTPATIENT_CLINIC_OR_DEPARTMENT_OTHER)
Admission: RE | Admit: 2022-06-29 | Discharge: 2022-06-29 | Disposition: A | Discharge status: Home / Self Care | Payer: Medicare Other | Source: Ambulatory Visit | Attending: Family | Admitting: Family

## 2022-06-29 VITALS — BP 136/60 | HR 75 | Ht 59.0 in | Wt 128.0 lb

## 2022-06-29 DIAGNOSIS — R252 Cramp and spasm: Secondary | ICD-10-CM | POA: Diagnosis not present

## 2022-06-29 DIAGNOSIS — R0609 Other forms of dyspnea: Secondary | ICD-10-CM

## 2022-06-29 DIAGNOSIS — I10 Essential (primary) hypertension: Secondary | ICD-10-CM | POA: Diagnosis not present

## 2022-06-29 DIAGNOSIS — I25118 Atherosclerotic heart disease of native coronary artery with other forms of angina pectoris: Secondary | ICD-10-CM | POA: Insufficient documentation

## 2022-06-29 DIAGNOSIS — E785 Hyperlipidemia, unspecified: Secondary | ICD-10-CM

## 2022-06-29 DIAGNOSIS — R0602 Shortness of breath: Secondary | ICD-10-CM | POA: Diagnosis not present

## 2022-06-29 DIAGNOSIS — M7061 Trochanteric bursitis, right hip: Secondary | ICD-10-CM | POA: Diagnosis not present

## 2022-06-29 MED ORDER — ISOSORBIDE MONONITRATE ER 30 MG PO TB24: ORAL_TABLET | ORAL | 3 refills | Status: DC

## 2022-06-29 NOTE — Patient Instructions (Addendum)
Medication Instructions:  Your physician has recommended you make the following change in your medication:   CHANGE Isosorbide Mononitrate to '90mg'$  in the morning and '30mg'$  in the afternoon   *If you need a refill on your cardiac medications before your next appointment, please call your pharmacy*   Lab Work: Your physician recommends that you return for lab work today: BMP, BNP, magnesium, CBC   If you have labs (blood work) drawn today and your tests are completely normal, you will receive your results only by: Megan [name] (if you have MyChart) OR A paper copy in the mail If you have any lab test that is abnormal or we need to change your treatment, we will call you to review the results.   Testing/Procedures: A chest x-ray takes a picture of the organs and structures inside the chest, including the heart, lungs, and blood vessels. This test can show several things, including, whether the heart is enlarges; whether fluid is building up in the lungs; and whether pacemaker / defibrillator leads are still in place.    Follow-Up: At [facility], you and your health needs are our priority.  As part of our continuing mission to provide you with exceptional heart care, we have created designated Provider Care Teams.  These Care Teams include your primary Cardiologist (physician) and Advanced Practice Providers (APPs -  Physician Assistants and Nurse Practitioners) who all work together to provide you with the care you need, when you need it.  We recommend signing up for the patient portal called "MyChart".  Sign up information is provided on this After Visit Summary.  MyChart is used to connect with patients for Virtual Visits (Telemedicine).  Patients are able to view lab/test results, encounter notes, upcoming appointments, etc.  Non-urgent messages can be sent to your provider as well.   To learn more about what you can do with MyChart, go to [email or web address].    Your next  appointment:   As scheduled with Dr. Martinique   Other Instructions  Heart Healthy Diet Recommendations: A low-salt diet is recommended. Meats should be grilled, baked, or boiled. Avoid fried foods. Focus on lean protein sources like fish or chicken with vegetables and fruits. The American Heart Association is a Microbiologist!  American Heart Association Diet and Lifeystyle Recommendations   Exercise recommendations: The American Heart Association recommends 150 minutes of moderate intensity exercise weekly. Try 30 minutes of moderate intensity exercise 4-5 times per week. This could include walking, jogging, or swimming.   Important Information About Sugar

## 2022-06-29 NOTE — Progress Notes (Signed)
Office Visit    Patient Name: Megan [name] Date of Encounter: 06/29/2022  PCP:  [name], [place]  Cardiologist:  [name], MD  Advanced Practice Provider:  No care team member to display Electrophysiologist:  None      Chief Complaint    Megan [name] is a 76 y.o. female presents today for shortness of breath.    Past Medical History    Past Medical History:  Diagnosis Date   AAA (abdominal aortic aneurysm) ([place])    a. s/p stent grafting in 2009.   Adenomatous colon polyp 05/2001   Adenomatous duodenal polyp    Allergy    Anginal pain (HCC)    COPD (chronic obstructive pulmonary disease) ([place])    pt denies COPD   Coronary artery disease    a. s/p CABG in 1995;  b. 05/2013 Neg MV, EF 82%;  c. 06/2013 Cath: LM nl, LAD 40-50p, LCX nl, RCA 80p/165m VG->RCA->PDA->PLSA min irregs, LIMA->LAD atretic, vigorous LV fxn.   Diabetes mellitus without complication (HCC)    history of, resolved. 2017   Diverticulosis    Eczema, dyshidrotic    Eczema, dyshidrotic 1986   Dr. HMathis Fare  Emphysema of lung ([facility]    Fatty liver    Fibromyalgia    GERD (gastroesophageal reflux disease)    Hyperlipidemia    Hypertension    Migraine    Myocardial infarction ([facility]    Osteoarthritis    Osteoporosis    unsure, having a bone scan soon   Peripheral arterial disease ([place]    left leg diagnosed by Dr BMare Ferrari  Renal artery stenosis ([facility]    a. 2008 s/p PTA   Past Surgical History:  Procedure Laterality Date   ABDOMINAL AORTAGRAM N/A 03/06/2014   Procedure: ABDOMINAL AORTAGRAM;  Surgeon: TRosetta Posner MD;  [facility];  Service: Cardiovascular;  Laterality: N/A;   ABDOMINAL AORTIC ANEURYSM REPAIR  2009   stenting   ABDOMINAL HYSTERECTOMY     Total, 1992   BREAST BIOPSY     CARDIAC CATHETERIZATION     CARDIOVASCULAR STRESS TEST  11/11/2009   normal study   CARPAL TUNNEL RELEASE  08/1993   left wrist   CARPAL TUNNEL RELEASE   01/1997   right   CATARACT EXTRACTION, BILATERAL  2021   CORONARY ARTERY BYPASS GRAFT  07/1994   Triple by Dr ECeasar Mons  CORONARY STENT INTERVENTION N/A 02/02/2021   Procedure: CORONARY STENT INTERVENTION;  Surgeon: ENelva Bush MD;  [place];  Service: Cardiovascular;  Laterality: N/A;   CORONARY STENT INTERVENTION N/A 06/26/2021   Procedure: CORONARY STENT INTERVENTION;  Surgeon: CSherren Mocha MD;  [facility];  Service: Cardiovascular;  Laterality: N/A;   dental implants     EYE SURGERY  03/2020   cataract   HAND SURGERY Right 09/2002   Right hand pulley release   INTRAVASCULAR ULTRASOUND/IVUS N/A 06/26/2021   Procedure: Intravascular Ultrasound/IVUS;  Surgeon: CSherren Mocha MD;  [place] [facility];  Service: Cardiovascular;  Laterality: N/A;   INTRAVASCULAR ULTRASOUND/IVUS N/A 09/03/2021   Procedure: Intravascular Ultrasound/IVUS;  Surgeon: TEarly Osmond MD;  [place];  Service: Cardiovascular;  Laterality: N/A;   LAMINECTOMY WITH POSTERIOR LATERAL ARTHRODESIS LEVEL 1 N/A 03/24/2022   Procedure: LAMINECTOMY, FORAMINOTOMY, LUMBAR TWO- THREE ; INTERTRANSVERSE FUSION LUMBAR TWO - LUMBAR THREE RIGHT;  Surgeon: JEustace Moore  MD;  Location: [place];  Service: Neurosurgery;  Laterality: N/A;   LEFT HEART CATH AND CORONARY ANGIOGRAPHY N/A 06/26/2021   Procedure: LEFT HEART CATH AND CORONARY ANGIOGRAPHY;  Surgeon: Sherren Mocha, MD;  Location: [place] CV LAB;  Service: Cardiovascular;  Laterality: N/A;   LEFT HEART CATH AND CORS/GRAFTS ANGIOGRAPHY N/A 02/02/2021   Procedure: LEFT HEART CATH AND CORS/GRAFTS ANGIOGRAPHY;  Surgeon: Nelva Bush, MD;  Location: [place] CV LAB;  Service: Cardiovascular;  Laterality: N/A;   LEFT HEART CATH AND CORS/GRAFTS ANGIOGRAPHY N/A 09/03/2021   Procedure: LEFT HEART CATH AND CORS/GRAFTS ANGIOGRAPHY;  Surgeon: Early Osmond, MD;  Location: [place] CV LAB;  Service:  Cardiovascular;  Laterality: N/A;   LEFT HEART CATHETERIZATION WITH CORONARY/GRAFT ANGIOGRAM N/A 06/11/2013   Procedure: LEFT HEART CATHETERIZATION WITH [name];  Surgeon: Thayer Headings, MD;  Location: [facility] CATH LAB;  Service: Cardiovascular;  Laterality: N/A;   MULTIPLE TOOTH EXTRACTIONS  2000   All upper teeth.  Has full dneture.    RENAL ARTERY STENT  2008   SHOULDER ARTHROSCOPY WITH ROTATOR CUFF REPAIR Right 09/27/2018   Procedure: Right shoulder mini open rotator cuff repair;  Surgeon: Susa Day, MD;  Location: [facility];  Service: Orthopedics;  Laterality: Right;  90 mins   thyroid cyst apiration  05/1997 and 07/1998   TONSILECTOMY, ADENOIDECTOMY, BILATERAL MYRINGOTOMY AND TUBES  1989   TONSILLECTOMY     TOTAL ABDOMINAL HYSTERECTOMY  1992    Allergies  Allergies  Allergen Reactions   Brilinta [Ticagrelor] Shortness Of Breath   Bextra [Valdecoxib] Other (See Comments)    Increased lft's    Hydrocod Poli-Chlorphe Poli Er Rash   Lipitor [Atorvastatin] Other (See Comments)    Leg weakness    Mevacor [Lovastatin] Other (See Comments)    Muscle weakness    Plavix [Clopidogrel Bisulfate] Itching and Other (See Comments)    Hands and feet tingle and itch   Zocor [Simvastatin] Other (See Comments)    Bones hurt    Doxycycline Diarrhea and Nausea And Vomiting   Metoprolol Other (See Comments)    Hair loss   Morphine And Related Itching and Nausea Only   Codeine Itching and Rash    Hyper-active   Lisinopril Cough    History of Present Illness    Megan [name] is a 76 y.o. female with a hx of CAD, HTN, PAD, HLD last seen 01/11/22.  S/p CABG 1995 by Dr. Servando Snare. She had renal artery stenting in 2008. Stent graft of abdominal aortic aneurysm in 2009. She is s/p stenting of left iliac. June 2014 cardiac cath with patent graft. Hospitalized 11/15/14 with myoview with no ischemia. [place] June 2019 no endoleak, AAA 4 cm.   Re admitted 01/2021 with NSTEMI. Cardiac cath  with patent LIMA-LAD. Native RCA occluded and severe sequential lesions in SVG-RCA treated with overlapping DESx2. Complicated by left forearm hematoma. DAPT ASA and Brilinta. Metoprolol and high intensity statin continued. Re admitted 02/11/21 with chest pain with troponin negative x 3. She was transitioned from Brilinta to Plavix due to dyspnea.   Readmitted 06/2021 due to chest pain with restenosis of SVG-RCA with evidence of prior stent fracture. Repeat PCI performed. Later that month due to chest pain Imdur increased. Seen 08/2021 with repeat chest pain and cardiac cath with patent stent.   Last seen 01/11/22 doing well from cardiac perspective with no chest pain. 03/24/22 had lumbar laminectomy.   Presents today for dyspnea. Tells me Tuesday of last week  had her annual visit with her PCP. Noted her BP was a little high and Losartan dose was increased to '100mg'$  daily. Tells me the next day started having cold chills and shaking. Felt she was coming down with a cold. Noted body aches, temperature was 33 F which is much higher than her baseline. COVID tests x3 were negative. Friday started with sporadic shortness of breath. Shares with me that her angina equivalent is heart "aching" which started Friday intermittently. Sunday it was a bit more notable particularly with activity like walking across the kitchen.   She took nitroglycerin yesterday afternoon and symptoms improved after one tablet. A few days last week she took an extra Imdur in the afternoon.   Tells me today her breathing and "ache" were better. Has not previously had shortness of breath as anginal equivalent. Does note recent stress as her husband is moving from ALF to memory care unit and hospice has been called in as a resource.   EKGs/Labs/Other Studies Reviewed:   The following studies were reviewed today:  EKG:  EKG is  ordered today.  The ekg ordered today demonstrates NSR 75 bpm with stable peaked appearance of T wave in lead V2.    Recent Labs: 11/09/2021: TSH 1.62 03/19/2022: BUN 11; Creatinine, Ser 0.73; Hemoglobin 13.8; Platelets 207; Potassium 3.7; Sodium 137 04/05/2022: ALT 16  Recent Lipid Panel    Component Value Date/Time   CHOL 142 04/05/2022 1005   TRIG 209 (H) 04/05/2022 1005   HDL 51 04/05/2022 1005   CHOLHDL 2.8 04/05/2022 1005   CHOLHDL 2.9 06/25/2021 0408   VLDL 20 06/25/2021 0408   LDLCALC 57 04/05/2022 1005   LDLDIRECT 82.0 06/13/2020 1017    Home Medications   Current Meds  Medication Sig   Accu-Chek FastClix Lancets MISC Check blood sugar once daily PRN   albuterol (VENTOLIN HFA) 108 (90 Base) MCG/ACT inhaler Inhale 2 puffs into the lungs every 6 (six) hours as needed for wheezing or shortness of breath.   ALPRAZolam (XANAX) 0.25 MG tablet Take 1 tablet (0.25 mg total) by mouth at bedtime as needed for sleep.   aspirin EC 81 MG tablet Take 1 tablet (81 mg total) by mouth daily.   atorvastatin (LIPITOR) 80 MG tablet TAKE 1 TABLET BY MOUTH EVERY DAY (Patient taking differently: Take 80 mg by mouth every evening.)   B Complex-Biotin-FA (B-100 COMPLEX PO) Take 1 tablet by mouth every evening.   bismuth subsalicylate (PEPTO BISMOL) 262 MG/15ML suspension Take 30 mLs by mouth every 6 (six) hours as needed for indigestion or diarrhea or loose stools.   cetirizine (ZYRTEC) 10 MG tablet Take 10 mg by mouth daily as needed for allergies.   cholecalciferol (VITAMIN D3) 25 MCG (1000 UNIT) tablet Take 1,000 Units by mouth every evening.   CORDRAN 4 MCG/SQCM TAPE Apply 1 each topically daily as needed (Rash).    cycloSPORINE (RESTASIS) 0.05 % ophthalmic emulsion Place 1 drop into both eyes 2 (two) times daily.    gabapentin (NEURONTIN) 300 MG capsule Take 1 capsule (300 mg total) by mouth 3 (three) times daily. (Patient taking differently: Take 300 mg by mouth 2 (two) times daily.)   glucose blood (ACCU-CHEK SMARTVIEW) test strip CHECK BLOOD SUGAR ONCE DAILY AS NEEDED   hydrochlorothiazide (MICROZIDE)  12.5 MG capsule Take 12.5 mg by mouth daily.   isosorbide mononitrate (IMDUR) 30 MG 24 hr tablet Take 3 tablets ( 90 mg ) every morning   losartan (COZAAR) 100 MG tablet Take  1 tablet (100 mg total) by mouth daily.   methocarbamol (ROBAXIN) 500 MG tablet Take 1 tablet (500 mg total) by mouth every 6 (six) hours as needed for muscle spasms.   Multiple Vitamins-Minerals (MULTIVITAMIN WITH MINERALS) tablet Take 1 tablet by mouth every evening.   nitroGLYCERIN (NITROSTAT) 0.4 MG SL tablet Place 1 tablet (0.4 mg total) under the tongue every 5 (five) minutes as needed for chest pain.   omeprazole (PRILOSEC) 40 MG capsule Take 1 capsule (40 mg total) by mouth in the morning and at bedtime.   oxyCODONE-acetaminophen (PERCOCET/ROXICET) 5-325 MG tablet Take 1-2 tablets by mouth every 4 (four) hours as needed for moderate pain or severe pain.   prasugrel (EFFIENT) 5 MG TABS tablet TAKE 1 TABLET (5 MG TOTAL) BY MOUTH DAILY.   PRESCRIPTION MEDICATION Place 2 puffs into both ears daily as needed (ear infections). [facility] ear powder   Simethicone (GAS-X PO) Take 1-2 tablets by mouth daily as needed (gas).   tretinoin (RETIN-A) 0.1 % cream Apply 1 application. topically every evening.     Review of Systems      All other systems reviewed and are otherwise negative except as noted above.  Physical Exam    VS:  BP 136/60   Pulse 75   Ht '4\' 11"'$  (1.499 m)   Wt 128 lb (58.1 kg)   BMI 25.85 kg/m  , BMI Body mass index is 25.85 kg/m.  Wt Readings from Last 3 Encounters:  06/29/22 128 lb (58.1 kg)  06/21/22 128 lb 3.2 oz (58.2 kg)  03/24/22 125 lb (56.7 kg)     GEN: Well nourished, well developed, in no acute distress. HEENT: normal. Neck: Supple, no JVD, carotid bruits, or masses. Cardiac: RRR, no murmurs, rubs, or gallops. No clubbing, cyanosis, edema.  Radials/PT 2+ and equal bilaterally.  Respiratory:  Respirations regular and unlabored, clear to auscultation bilaterally. LLL inspiratory  crackles GI: Soft, nontender, nondistended. MS: No deformity or atrophy. Skin: Warm and dry, no rash. Neuro:  Strength and sensation are intact. Psych: Normal affect.  Assessment & Plan    CAD / Dyspnea - One week history of chest discomfort similar to her anginal equivalent. However, also with exertional dyspnea, chills, and subjective fever that is different than her anginal equivalent. Symptoms resolved today. CXR today. Labs including BMP, BNP, magnesium, CBC. If BNP elevated, consider echo. Increase Imdur to '90mg'$  AM and '30mg'$  PM. GDMT aspirin, prasugrel, zetia, atorvastatin, imdur. Has follow up next week with Dr. Martinique. If still with persistent angina at that time, could consider LHC. Her EKG today is without acute St/T wave changes.   HTN - PCP recently increased Losartan. BP still slightly above goal of <130/80. Increase Imdur as above. Discussed to monitor BP at home at least 2 hours after medications and sitting for 5-10 minutes.   HLD - 04/2022 LDL 57 bpm. Continue Atorvastatin, Zetia. No myalgias.   PAD - No new claudication symptoms. Follows with [facility].          Disposition: Follow up in 1 week(s) with [name], MD   Signed, Loel Dubonnet, NP 06/29/2022, 2:56 PM [place]

## 2022-06-30 LAB — BRAIN NATRIURETIC PEPTIDE: BNP: 94.2 pg/mL (ref 0.0–100.0)

## 2022-06-30 LAB — CBC
Hematocrit: 45.3 % (ref 34.0–46.6)
Hemoglobin: 15.2 g/dL (ref 11.1–15.9)
MCH: 29.7 pg (ref 26.6–33.0)
MCHC: 33.6 g/dL (ref 31.5–35.7)
MCV: 89 fL (ref 79–97)
Platelets: 272 10*3/uL (ref 150–450)
RBC: 5.12 x10E6/uL (ref 3.77–5.28)
RDW: 12.4 % (ref 11.7–15.4)
WBC: 9.7 10*3/uL (ref 3.4–10.8)

## 2022-06-30 LAB — BASIC METABOLIC PANEL
BUN/Creatinine Ratio: 9 — ABNORMAL LOW (ref 12–28)
BUN: 7 mg/dL — ABNORMAL LOW (ref 8–27)
CO2: 27 mmol/L (ref 20–29)
Calcium: 10.2 mg/dL (ref 8.7–10.3)
Chloride: 102 mmol/L (ref 96–106)
Creatinine, Ser: 0.81 mg/dL (ref 0.57–1.00)
Glucose: 87 mg/dL (ref 70–99)
Potassium: 4.2 mmol/L (ref 3.5–5.2)
Sodium: 143 mmol/L (ref 134–144)
eGFR: 75 mL/min/{1.73_m2} (ref >59)

## 2022-06-30 LAB — MAGNESIUM: Magnesium: 1.9 mg/dL (ref 1.6–2.3)

## 2022-07-01 ENCOUNTER — Encounter (HOSPITAL_BASED_OUTPATIENT_CLINIC_OR_DEPARTMENT_OTHER): Payer: Self-pay

## 2022-07-02 NOTE — H&P (View-Only) (Signed)
Cardiology Office Note   Date:  07/07/2022   ID:  Megan [name], Megan [name] [birth year], MRN [id number]  PCP:  [name], MD  Cardiologist: Ariea Rochin Martinique MD  Chief Complaint  Patient presents with   Coronary Artery Disease        History of Present Illness: Megan [name] is a 76 y.o. female who is seen for follow up CAD.  She has a history  of CAD, HTN, HL, and PAD.   She is status post coronary artery bypass graft surgery in 1995- Dr Servando Snare. She had a renal artery stent for renal artery stenosis in 2008. She had a stent graft of an abdominal aortic aneurysm 2009. She is s/p stenting of the left iliac. She is followed by [name] for PAD. Last CT in May 2018. She had [place] in June 2019 showing no endoleak.    She was hospitalized in July 2014 and had cardiac catheterization which showed that her grafts were open. EF normal.  She was hospitalized 11/15/2014 with which he describes as an aching sensation in her chest.    She underwent a treadmill Myoview on 11/17/14 which showed no ischemia and her ejection fraction was 86%.   In June 2019 she had Abdominal US at [facility] showing AAA of 4 cm.   In October 2019 she underwent right rotator cuff repair. No complications.   She was  having progressive issues with her low back that limited her activity and caused pain. She has exhausted conservative measures and underwent lumbar fusion in July by Dr Ronnald Ramp.   She was in the hospital  1/31 and discharged on 2/8- for AKI, diarrhea- diverticulitis (sigmoid colon), metabolic encephalopathy, UTI. Echo at that time was OK with normal EF and moderate TR.   She was re- admitted 2/26-02/03/21 with NSTEMI. Troponin up to 1085. Ecg showed significant inferolateral TWA. She underwent cardiac cath demonstrating patent LIMA to the LAD. The native RCA was occluded and she had severe sequential lesions in the SVG to RCA. This was treated with overlapping DES x 2- 3.0 x 26 and 3.5 x 34 mm Onyx stents.  Procedure  was complicated by a left forearm hematoma. She was treated with DAPT with ASA and Brilinta. On metoprolol and high dose statin.   She was admitted for overnight observation on 02/11/21 after presenting with a localized ache in her left chest. This was different than when she presented with her NSTEMI. Ecg showed no new changes and troponin was negative x 3  On her last visit she complained of increased SOB- this was felt to be related to [place]. We switched to Plavix but she had an allergic reaction with rash and hives. Later switched to Prasugrel- reduced dose based on age and female.    She presented to the ED on 7/21 with complaints of chest pain over the past 2 days. Developed an aching sensation in the left side of her chest on Tuesday afternoon. Tried a SL nitro without relief. Also tried several reflux medications without much either. Symptoms lingered into the night and next morning she woke up with ongoing symptoms.  She underwent repeat cardiac cath demonstrating restenosis of the SVG to RCA with clear evidence of prior stent fracture. She underwent repeat PCI with 3.5 x 26 mm Onyx stent post dilated to 4.0 with [place] balloon using IVUS guidance. Of note there were no collaterals to the distal RCA. There was significant aortic calcification. On July 28 she complained of  some recurrent chest pain and her Imdur was increased. When seen in office on August 17 she was better. A sleep study ordered due to daytime somnolence.   She was seen in the ED on 08/27/21 with recurrent chest pain. Ecg showed no changes and troponins were negative. Reports pain the same as before. In discussion with cardiology fellow she was DC from ED with increased dose of Imdur to 90 mg daily and metoprolol to 37.5 mg bid. She was concerned so she underwent repeat cardiac cath which showed the stent was patent.   Was seen recently by Laurann Montana PA on Jully 25.  Noted symptoms of dyspnea. was seen by her PCP. Noted her BP  was a little high and Losartan dose was increased to '100mg'$  daily. The next day started having cold chills and shaking. Felt she was coming down with a cold. Noted body aches, temperature was 1 F which is much higher than her baseline. Cough with sputum production. COVID tests x3 were negative. Later developed  sporadic shortness of breath associated with chest aching. This was similar to prior anginal symptoms but not as bad. Labs were done and were normal. Ecg no change. Imdur was increased to 90 mg daily. Sometimes will take an extra dose in the evening.  On follow up today her symptoms are getting better. Still has some chest soreness. Breathing is much better.       Past Medical History:  Diagnosis Date   AAA (abdominal aortic aneurysm) ([place])    a. s/p stent grafting in 2009.   Adenomatous colon polyp 05/2001   Adenomatous duodenal polyp    Allergy    Anginal pain (HCC)    COPD (chronic obstructive pulmonary disease) ([place])    pt denies COPD   Coronary artery disease    a. s/p CABG in 1995;  b. 05/2013 Neg MV, EF 82%;  c. 06/2013 Cath: LM nl, LAD 40-50p, LCX nl, RCA 80p/112m VG->RCA->PDA->PLSA min irregs, LIMA->LAD atretic, vigorous LV fxn.   Diabetes mellitus without complication (HCC)    history of, resolved. 2017   Diverticulosis    Eczema, dyshidrotic    Eczema, dyshidrotic 1986   Dr. HMathis Fare  Emphysema of lung ([facility]    Fatty liver    Fibromyalgia    GERD (gastroesophageal reflux disease)    Hyperlipidemia    Hypertension    Migraine    Myocardial infarction ([facility]    Osteoarthritis    Osteoporosis    unsure, having a bone scan soon   [place]    left leg diagnosed by Dr BMare Ferrari  Renal artery stenosis ([facility]    a. 2008 s/p PTA    Past Surgical History:  Procedure Laterality Date   ABDOMINAL AORTAGRAM N/A 03/06/2014   Procedure: ABDOMINAL AORTAGRAM;  Surgeon: TRosetta Posner MD;  [facility];  Service: Cardiovascular;  Laterality:  N/A;   ABDOMINAL AORTIC ANEURYSM REPAIR  2009   stenting   ABDOMINAL HYSTERECTOMY     Total, 1992   BREAST BIOPSY     CARDIAC CATHETERIZATION     CARDIOVASCULAR STRESS TEST  11/11/2009   normal study   CARPAL TUNNEL RELEASE  08/1993   left wrist   CARPAL TUNNEL RELEASE  01/1997   right   CATARACT EXTRACTION, BILATERAL  2021   CORONARY ARTERY BYPASS GRAFT  07/1994   Triple by Dr ECeasar Mons  CORONARY STENT INTERVENTION N/A 02/02/2021   Procedure: CORONARY STENT INTERVENTION;  Surgeon: Nelva Bush, MD;  Location: [place] CV LAB;  Service: Cardiovascular;  Laterality: N/A;   CORONARY STENT INTERVENTION N/A 06/26/2021   Procedure: CORONARY STENT INTERVENTION;  Surgeon: Sherren Mocha, MD;  Location: [place] CV LAB;  Service: Cardiovascular;  Laterality: N/A;   dental implants     EYE SURGERY  03/2020   cataract   HAND SURGERY Right 09/2002   Right hand pulley release   INTRAVASCULAR ULTRASOUND/IVUS N/A 06/26/2021   Procedure: Intravascular Ultrasound/IVUS;  Surgeon: Sherren Mocha, MD;  Location: [place] CV LAB;  Service: Cardiovascular;  Laterality: N/A;   INTRAVASCULAR ULTRASOUND/IVUS N/A 09/03/2021   Procedure: Intravascular Ultrasound/IVUS;  Surgeon: Early Osmond, MD;  Location: [place] CV LAB;  Service: Cardiovascular;  Laterality: N/A;   LAMINECTOMY WITH POSTERIOR LATERAL ARTHRODESIS LEVEL 1 N/A 03/24/2022   Procedure: LAMINECTOMY, FORAMINOTOMY, LUMBAR TWO- THREE ; INTERTRANSVERSE FUSION LUMBAR TWO - LUMBAR THREE RIGHT;  Surgeon: Eustace Moore, MD;  Location: [place];  Service: Neurosurgery;  Laterality: N/A;   LEFT HEART CATH AND CORONARY ANGIOGRAPHY N/A 06/26/2021   Procedure: LEFT HEART CATH AND CORONARY ANGIOGRAPHY;  Surgeon: Sherren Mocha, MD;  Location: [place] CV LAB;  Service: Cardiovascular;  Laterality: N/A;   LEFT HEART CATH AND CORS/GRAFTS ANGIOGRAPHY N/A 02/02/2021   Procedure: LEFT HEART CATH AND CORS/GRAFTS ANGIOGRAPHY;  Surgeon: Nelva Bush, MD;  Location: [place] CV LAB;  Service: Cardiovascular;  Laterality: N/A;   LEFT HEART CATH AND CORS/GRAFTS ANGIOGRAPHY N/A 09/03/2021   Procedure: LEFT HEART CATH AND CORS/GRAFTS ANGIOGRAPHY;  Surgeon: Early Osmond, MD;  Location: [place] CV LAB;  Service: Cardiovascular;  Laterality: N/A;   LEFT HEART CATHETERIZATION WITH CORONARY/GRAFT ANGIOGRAM N/A 06/11/2013   Procedure: LEFT HEART CATHETERIZATION WITH [name];  Surgeon: Thayer Headings, MD;  Location: [facility] C[facility] LAB;  Service: Cardiovascular;  Laterality: N/A;   MULTIPLE TOOTH EXTRACTIONS  2000   All upper teeth.  Has full dneture.    RENAL ARTERY STENT  2008   SHOULDER ARTHROSCOPY WITH ROTATOR CUFF REPAIR Right 09/27/2018   Procedure: Right shoulder mini open rotator cuff repair;  Surgeon: Susa Day, MD;  Location: [facility];  Service: Orthopedics;  Laterality: Right;  90 mins   thyroid cyst apiration  05/1997 and 07/1998   TONSILECTOMY, ADENOIDECTOMY, BILATERAL MYRINGOTOMY AND TUBES  1989   TONSILLECTOMY     TOTAL ABDOMINAL HYSTERECTOMY  1992     Current Outpatient Medications  Medication Sig Dispense Refill   Accu-Chek FastClix Lancets MISC Check blood sugar once daily PRN 100 each 1   albuterol (VENTOLIN HFA) 108 (90 Base) MCG/ACT inhaler Inhale 2 puffs into the lungs every 6 (six) hours as needed for wheezing or shortness of breath. 8 g 2   ALPRAZolam (XANAX) 0.25 MG tablet Take 1 tablet (0.25 mg total) by mouth at bedtime as needed for sleep. 90 tablet 3   aspirin EC 81 MG tablet Take 1 tablet (81 mg total) by mouth daily. 90 tablet 3   atorvastatin (LIPITOR) 80 MG tablet TAKE 1 TABLET BY MOUTH EVERY DAY (Patient taking differently: Take 80 mg by mouth every evening.) 90 tablet 3   B Complex-Biotin-FA (B-100 COMPLEX PO) Take 1 tablet by mouth every evening.     bismuth subsalicylate (PEPTO BISMOL) 262 MG/15ML suspension Take 30 mLs by mouth every 6 (six) hours as needed for indigestion or  diarrhea or loose stools.     cetirizine (ZYRTEC) 10 MG tablet Take 10 mg by mouth daily  as needed for allergies.     cholecalciferol (VITAMIN D3) 25 MCG (1000 UNIT) tablet Take 1,000 Units by mouth every evening.     CORDRAN 4 MCG/SQCM TAPE Apply 1 each topically daily as needed (Rash).      cycloSPORINE (RESTASIS) 0.05 % ophthalmic emulsion Place 1 drop into both eyes 2 (two) times daily.      gabapentin (NEURONTIN) 300 MG capsule Take 1 capsule (300 mg total) by mouth 3 (three) times daily. (Patient taking differently: Take 300 mg by mouth 2 (two) times daily.) 270 capsule 3   glucose blood (ACCU-CHEK SMARTVIEW) test strip CHECK BLOOD SUGAR ONCE DAILY AS NEEDED 100 strip 1   hydrochlorothiazide (MICROZIDE) 12.5 MG capsule Take 12.5 mg by mouth daily.     isosorbide mononitrate (IMDUR) 30 MG 24 hr tablet Take 3 tablets ( 90 mg ) every morning and 1 tablet (30 mg ) every evening 360 tablet 3   losartan (COZAAR) 100 MG tablet Take 1 tablet (100 mg total) by mouth daily. 90 tablet 3   Multiple Vitamins-Minerals (MULTIVITAMIN WITH MINERALS) tablet Take 1 tablet by mouth every evening.     nitroGLYCERIN (NITROSTAT) 0.4 MG SL tablet Place 1 tablet (0.4 mg total) under the tongue every 5 (five) minutes as needed for chest pain. 25 tablet 11   omeprazole (PRILOSEC) 40 MG capsule Take 1 capsule (40 mg total) by mouth in the morning and at bedtime. 180 capsule 3   oxyCODONE-acetaminophen (PERCOCET) 10-325 MG tablet Take one tablet three times a day as needed for back pain     prasugrel (EFFIENT) 5 MG TABS tablet TAKE 1 TABLET (5 MG TOTAL) BY MOUTH DAILY. 90 tablet 3   PRESCRIPTION MEDICATION Place 2 puffs into both ears daily as needed (ear infections). [facility] ear powder     Simethicone (GAS-X PO) Take 1-2 tablets by mouth daily as needed (gas).     tretinoin (RETIN-A) 0.1 % cream Apply 1 application. topically every evening.     ezetimibe (ZETIA) 10 MG tablet Take 1 tablet (10 mg total) by mouth daily. 90  tablet 3   No current facility-administered medications for this visit.    Allergies:   Brilinta [ticagrelor], Bextra [valdecoxib], Hydrocod poli-chlorphe poli er, Lipitor [atorvastatin], Mevacor [lovastatin], Plavix [clopidogrel bisulfate], Zocor [simvastatin], Doxycycline, Metoprolol, Morphine and related, Codeine, and Lisinopril    Social History:  The patient  reports that she quit smoking about 26 years ago. Her smoking use included cigarettes. She has never used smokeless tobacco. She reports current alcohol use of about 6.0 - 10.0 standard drinks of alcohol per week. She reports that she does not use drugs.   Family History:  The patient's family history includes AAA (abdominal aortic aneurysm) in her brother; Breast cancer in her sister; Colon cancer in her cousin and paternal grandmother; Diabetes in her brother and sister; Heart attack in her brother, father, mother, and sister; Heart disease in her brother, brother, father, mother, and sister; Hyperlipidemia in her brother and sister; Hypertension in her brother and sister; Liver cancer in her sister.    ROS:  Please see the history of present illness.   Otherwise, review of systems are positive for none.   All other systems are reviewed and negative.    PHYSICAL EXAM: VS:  BP 118/70 (BP Location: Left Arm, Patient Position: Sitting, Cuff Size: Normal)   Pulse 86   Resp 20   Ht '4\' 11"'$  (1.499 m)   Wt 128 lb 6.4 oz (58.2 kg)  SpO2 93%   BMI 25.93 kg/m  , BMI Body mass index is 25.93 kg/m. GENERAL:  Overweight  WF in NAD HEENT:  PERRL, EOMI, sclera are clear. Oropharynx is clear. NECK:  No jugular venous distention, carotid upstroke brisk and symmetric, no bruits, no thyromegaly or adenopathy LUNGS:  Clear to auscultation bilaterally CHEST:  Unremarkable HEART:  RRR,  PMI not displaced or sustained,S1 and S2 within normal limits, no S3, no S4: no clicks, no rubs, no murmurs ABD:  Soft, nontender. BS +, no masses or bruits. No  hepatomegaly, no splenomegaly EXT:  1 + pulses throughout, no edema, no cyanosis no clubbing SKIN:  Warm and dry.  No rashes NEURO:  Alert and oriented x 3. Cranial nerves II through XII intact. PSYCH:  Cognitively intact     EKG:  EKG is not done today.    Recent Labs: 11/09/2021: TSH 1.62 04/05/2022: ALT 16 06/29/2022: BNP 94.2; BUN 7; Creatinine, Ser 0.81; Hemoglobin 15.2; Magnesium 1.9; Platelets 272; Potassium 4.2; Sodium 143   Labs from primary care 12/15/16: A1c 5.6%. : Cholesterol 167 Trig-209, HDL 52, LDL 73.  AST 51, ALT 65. Normal renal indices. TSH is normal. Dated 06/24/17: CRP <0.3. Glucose 120, AST 58, ALT 80, other chemistries normal.  Dated 09/28/18: normal CBC, CMET  Lipid Panel    Component Value Date/Time   CHOL 142 04/05/2022 1005   TRIG 209 (H) 04/05/2022 1005   HDL 51 04/05/2022 1005   CHOLHDL 2.8 04/05/2022 1005   CHOLHDL 2.9 06/25/2021 0408   VLDL 20 06/25/2021 0408   LDLCALC 57 04/05/2022 1005   LDLDIRECT 82.0 06/13/2020 1017      Wt Readings from Last 3 Encounters:  07/07/22 128 lb 6.4 oz (58.2 kg)  07/05/22 128 lb (58.1 kg)  06/29/22 128 lb (58.1 kg)    Cardiac cath / PCI 02/02/21: CORONARY STENT INTERVENTION  LEFT HEART CATH AND CORS/GRAFTS ANGIOGRAPHY    Conclusion  Conclusions: Severe single-vessel coronary artery disease with chronic total occlusion of mid RCA. Mild to moderate, diffuse LAD disease with competitive flow in the distal vessel from LIMA-LAD. Small but patent LIMA-LAD. Patent SVG-rPDA-rPL with diffuse ostial/proximal disease of up to 60% and tandem hazy 95% and 90% mid graft lesions. Successful PCI to ostial/proximal and mid SVG-rPDA-rPL using non-overlapping Resolute Onyx 3.0 x 26 mm and 3.5 x 34 mm drug-eluting stents with 0% residual stenosis and TIMI-3 flow. Left forearm hematoma following sheath removal ant TR band placement, controlled with manual compression and placement of second TR band proximal to the first band.    Recommendations: Dual antiplatelet therapy with aspirin and ticagrelor for at least 12 months. Aggressive secondary prevention. Close monitoring of left forearm hematoma.   Nelva Bush, MD [facility]    Coronary Diagrams   Diagnostic Dominance: Right    Intervention     Implants       Echo 01/31/21: IMPRESSIONS     1. Left ventricular ejection fraction, by estimation, is 60 to 65%. The  left ventricle has normal function. The left ventricle has no regional  wall motion abnormalities. Left ventricular diastolic parameters are  consistent with Grade I diastolic  dysfunction (impaired relaxation).   2. Right ventricular systolic function is normal. The right ventricular  size is normal. There is normal pulmonary artery systolic pressure.   3. Left atrial size was severely dilated.   4. The mitral valve is grossly normal. Mild mitral valve regurgitation:  Apperance of two jets; study may underestimated regurgitation  severity.   5. The aortic valve is tricuspid. Aortic valve regurgitation is not  visualized. No aortic stenosis is present.   6. The inferior vena cava is normal in size with greater than 50%  respiratory variability, suggesting right atrial pressure of 3 mmHg.   Comparison(s): A prior study was performed on 01/09/21. No significant  change from prior study. Prior images reviewed side by side. Similar to  prior.    Cardiac cath 06/26/21: Procedures  CORONARY STENT INTERVENTION  Intravascular Ultrasound/IVUS  LEFT HEART CATH AND CORONARY ANGIOGRAPHY   Conclusion      Dist LM lesion is 15% stenosed.   Prox RCA lesion is 75% stenosed.   Mid RCA to Dist RCA lesion is 100% stenosed with 100% stenosed side branch in RPAV.   Origin to Prox Graft lesion before RPDA  is 80% stenosed.   Non-stenotic Mid Graft-1 lesion before RPDA was previously treated.   Non-stenotic Mid Graft-2 lesion before RPDA was previously treated.   A drug-eluting stent was  successfully placed using a STENT ONYX FRONTIER 3.5X26.   Post intervention, there is a 20% residual stenosis.   Seq SVG- and is large.   LIMA and is small.   There is competitive flow.   1.  Stable native vessel coronary artery disease with chronic total occlusion of the proximal to mid RCA, patency of the left main, diffuse mild to moderate calcific nonobstructive LAD stenosis, and moderate nonobstructive proximal left circumflex stenosis 2.  Continued patency of the LIMA to LAD 3.  Continued patency of the sequential saphenous vein graft to PDA and posterolateral branches with severe in-stent restenosis in the proximal stent, both lesions likely related to stent fracture 4.  Successful intravascular ultrasound-guided PCI of the saphenous vein graft to RCA using a 3.5 x 26 mm resolute Onyx DES postdilated to high pressure with a 4.0 mm noncompliant balloon.  Diagnostic Dominance: Right Intervention    Implants    Cardiac cath 09/03/21:  Early Osmond, MD (Primary)    Martinique, Camrynn Mcclintic M, MD (Assisting)     Procedures  Intravascular Ultrasound/IVUS  LEFT HEART CATH AND CORS/GRAFTS ANGIOGRAPHY   Conclusion      Dist LM lesion is 15% stenosed.   Prox RCA lesion is 75% stenosed.   Mid RCA to Dist RCA lesion is 100% stenosed with 100% stenosed side branch in RPAV.   Origin to Prox Graft lesion before RPDA  is 20% stenosed.   Non-stenotic Mid Graft-1 lesion before RPDA was previously treated.   Non-stenotic Mid Graft-2 lesion before RPDA was previously treated.   Seq SVG- and is large.   LIMA and is small.   There is competitive flow.   LV end diastolic pressure is normal.    Patent vein graft to PDA with minimal constraint; IVUS interrogation demonstrated a minimal stent area of 5.20m2. Patent LIMA to LAD with relatively unchanged burden of disease elsewhere. Normal LVEDP   Recommendations:  Medical management.    ASSESSMENT AND PLAN:  1. CAD status post CABG in 1995.   s/p NSTEMI in Feb. Cardiac cath demonstrated patent LIMA to the LAD. Occluded native RCA. SVG to PDA/PL had severe sequential stenoses treated with DES x 2. Normal EF.  Developed dyspnea on Brilinta. Allergic reaction to Plavix. Now on Prasugrel lower dose due to age and female sex. Had recurrent angina in July. Restenosis in the proximal stent in the SVG to RCA with some associated stent fracture. Had repeat stenting with IVUS guidance.  Now 2 months later had recurrent angina on optimal medical therapy. Fortunately repeat cath showed the stent was still patent. We did discuss that if she had recurrence we would try Shockwave therapy. Will continue RX. 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 I suspect her recent symptoms were exacerbated by her URI. She is getting better. We discussed what to do if chest pain dose not resolve- go to ED. I think she will know when repeat cath is needed.  2. HTN with hypertensive heart disease. BP is under  good control.   3. Hypercholesterolemia- LDL 57 on statin and Zetia.   4.  Status post stent to right renal artery 2008. Patent by CT in October  5. Status post stent graft to abdominal aortic aneurysm 2009.  S/p iliac stent in 2016. Stable by CT in October  6. Peripheral artery disease followed by Dr.Cain  7. Lumbar spine disease. S/p fusion with persistent pain. CT planned with Dr Ronnald Ramp.   Dispostion: follow up in 4 months.   Signed, Arlana Canizales Martinique MD, [facility]    07/07/2022 3:48 PM    [place]

## 2022-07-02 NOTE — Progress Notes (Unsigned)
Cardiology Office Note   Date:  07/07/2022   ID:  Joscelyne, Renville 1946/09/13, MRN [id number]  PCP:  [name], MD  Cardiologist: Jmari Pelc Martinique MD  Chief Complaint  Patient presents with   Coronary Artery Disease        History of Present Illness: Megan [name] is a 76 y.o. female who is seen for follow up CAD.  She has a history  of CAD, HTN, HL, and PAD.   She is status post coronary artery bypass graft surgery in 1995- Dr Servando Snare. She had a renal artery stent for renal artery stenosis in 2008. She had a stent graft of an abdominal aortic aneurysm 2009. She is s/p stenting of the left iliac. She is followed by [name] for PAD. Last CT in May 2018. She had [place] in June 2019 showing no endoleak.    She was hospitalized in July 2014 and had cardiac catheterization which showed that her grafts were open. EF normal.  She was hospitalized 11/15/2014 with which he describes as an aching sensation in her chest.    She underwent a treadmill Myoview on 11/17/14 which showed no ischemia and her ejection fraction was 86%.   In June 2019 she had Abdominal US at [facility] showing AAA of 4 cm.   In October 2019 she underwent right rotator cuff repair. No complications.   She was  having progressive issues with her low back that limited her activity and caused pain. She has exhausted conservative measures and underwent lumbar fusion in July by Dr Ronnald Ramp.   She was in the hospital  1/31 and discharged on 2/8- for AKI, diarrhea- diverticulitis (sigmoid colon), metabolic encephalopathy, UTI. Echo at that time was OK with normal EF and moderate TR.   She was re- admitted 2/26-02/03/21 with NSTEMI. Troponin up to 1085. Ecg showed significant inferolateral TWA. She underwent cardiac cath demonstrating patent LIMA to the LAD. The native RCA was occluded and she had severe sequential lesions in the SVG to RCA. This was treated with overlapping DES x 2- 3.0 x 26 and 3.5 x 34 mm Onyx stents.  Procedure  was complicated by a left forearm hematoma. She was treated with DAPT with ASA and Brilinta. On metoprolol and high dose statin.   She was admitted for overnight observation on 02/11/21 after presenting with a localized ache in her left chest. This was different than when she presented with her NSTEMI. Ecg showed no new changes and troponin was negative x 3  On her last visit she complained of increased SOB- this was felt to be related to [place]. We switched to Plavix but she had an allergic reaction with rash and hives. Later switched to Prasugrel- reduced dose based on age and female.    She presented to the ED on 7/21 with complaints of chest pain over the past 2 days. Developed an aching sensation in the left side of her chest on Tuesday afternoon. Tried a SL nitro without relief. Also tried several reflux medications without much either. Symptoms lingered into the night and next morning she woke up with ongoing symptoms.  She underwent repeat cardiac cath demonstrating restenosis of the SVG to RCA with clear evidence of prior stent fracture. She underwent repeat PCI with 3.5 x 26 mm Onyx stent post dilated to 4.0 with [place] balloon using IVUS guidance. Of note there were no collaterals to the distal RCA. There was significant aortic calcification. On July 28 she complained of  some recurrent chest pain and her Imdur was increased. When seen in office on August 17 she was better. A sleep study ordered due to daytime somnolence.   She was seen in the ED on 08/27/21 with recurrent chest pain. Ecg showed no changes and troponins were negative. Reports pain the same as before. In discussion with cardiology fellow she was DC from ED with increased dose of Imdur to 90 mg daily and metoprolol to 37.5 mg bid. She was concerned so she underwent repeat cardiac cath which showed the stent was patent.   Was seen recently by Laurann Montana PA on Jully 25.  Noted symptoms of dyspnea. was seen by her PCP. Noted her BP  was a little high and Losartan dose was increased to '100mg'$  daily. The next day started having cold chills and shaking. Felt she was coming down with a cold. Noted body aches, temperature was 88 F which is much higher than her baseline. Cough with sputum production. COVID tests x3 were negative. Later developed  sporadic shortness of breath associated with chest aching. This was similar to prior anginal symptoms but not as bad. Labs were done and were normal. Ecg no change. Imdur was increased to 90 mg daily. Sometimes will take an extra dose in the evening.  On follow up today her symptoms are getting better. Still has some chest soreness. Breathing is much better.       Past Medical History:  Diagnosis Date   AAA (abdominal aortic aneurysm) ([place])    a. s/p stent grafting in 2009.   Adenomatous colon polyp 05/2001   Adenomatous duodenal polyp    Allergy    Anginal pain (HCC)    COPD (chronic obstructive pulmonary disease) ([place])    pt denies COPD   Coronary artery disease    a. s/p CABG in 1995;  b. 05/2013 Neg MV, EF 82%;  c. 06/2013 Cath: LM nl, LAD 40-50p, LCX nl, RCA 80p/176m VG->RCA->PDA->PLSA min irregs, LIMA->LAD atretic, vigorous LV fxn.   Diabetes mellitus without complication (HCC)    history of, resolved. 2017   Diverticulosis    Eczema, dyshidrotic    Eczema, dyshidrotic 1986   Dr. HMathis Fare  Emphysema of lung ([facility]    Fatty liver    Fibromyalgia    GERD (gastroesophageal reflux disease)    Hyperlipidemia    Hypertension    Migraine    Myocardial infarction ([facility]    Osteoarthritis    Osteoporosis    unsure, having a bone scan soon   Peripheral arterial disease ([facility]    left leg diagnosed by Dr BMare Ferrari  Renal artery stenosis ([facility]    a. 2008 s/p PTA    Past Surgical History:  Procedure Laterality Date   ABDOMINAL AORTAGRAM N/A 03/06/2014   Procedure: ABDOMINAL AORTAGRAM;  Surgeon: TRosetta Posner MD;  [facility];  Service: Cardiovascular;  Laterality:  N/A;   ABDOMINAL AORTIC ANEURYSM REPAIR  2009   stenting   ABDOMINAL HYSTERECTOMY     Total, 1992   BREAST BIOPSY     CARDIAC CATHETERIZATION     CARDIOVASCULAR STRESS TEST  11/11/2009   normal study   CARPAL TUNNEL RELEASE  08/1993   left wrist   CARPAL TUNNEL RELEASE  01/1997   right   CATARACT EXTRACTION, BILATERAL  2021   CORONARY ARTERY BYPASS GRAFT  07/1994   Triple by Dr ECeasar Mons  CORONARY STENT INTERVENTION N/A 02/02/2021   Procedure: CORONARY STENT INTERVENTION;  Surgeon: Nelva Bush, MD;  Location: [place] CV LAB;  Service: Cardiovascular;  Laterality: N/A;   CORONARY STENT INTERVENTION N/A 06/26/2021   Procedure: CORONARY STENT INTERVENTION;  Surgeon: Sherren Mocha, MD;  Location: [place] CV LAB;  Service: Cardiovascular;  Laterality: N/A;   dental implants     EYE SURGERY  03/2020   cataract   HAND SURGERY Right 09/2002   Right hand pulley release   INTRAVASCULAR ULTRASOUND/IVUS N/A 06/26/2021   Procedure: Intravascular Ultrasound/IVUS;  Surgeon: Sherren Mocha, MD;  Location: [place] CV LAB;  Service: Cardiovascular;  Laterality: N/A;   INTRAVASCULAR ULTRASOUND/IVUS N/A 09/03/2021   Procedure: Intravascular Ultrasound/IVUS;  Surgeon: Early Osmond, MD;  Location: [place] CV LAB;  Service: Cardiovascular;  Laterality: N/A;   LAMINECTOMY WITH POSTERIOR LATERAL ARTHRODESIS LEVEL 1 N/A 03/24/2022   Procedure: LAMINECTOMY, FORAMINOTOMY, LUMBAR TWO- THREE ; INTERTRANSVERSE FUSION LUMBAR TWO - LUMBAR THREE RIGHT;  Surgeon: Eustace Moore, MD;  Location: [place];  Service: Neurosurgery;  Laterality: N/A;   LEFT HEART CATH AND CORONARY ANGIOGRAPHY N/A 06/26/2021   Procedure: LEFT HEART CATH AND CORONARY ANGIOGRAPHY;  Surgeon: Sherren Mocha, MD;  Location: [place] CV LAB;  Service: Cardiovascular;  Laterality: N/A;   LEFT HEART CATH AND CORS/GRAFTS ANGIOGRAPHY N/A 02/02/2021   Procedure: LEFT HEART CATH AND CORS/GRAFTS ANGIOGRAPHY;  Surgeon: Nelva Bush, MD;  Location: [place] CV LAB;  Service: Cardiovascular;  Laterality: N/A;   LEFT HEART CATH AND CORS/GRAFTS ANGIOGRAPHY N/A 09/03/2021   Procedure: LEFT HEART CATH AND CORS/GRAFTS ANGIOGRAPHY;  Surgeon: Early Osmond, MD;  Location: [place] CV LAB;  Service: Cardiovascular;  Laterality: N/A;   LEFT HEART CATHETERIZATION WITH CORONARY/GRAFT ANGIOGRAM N/A 06/11/2013   Procedure: LEFT HEART CATHETERIZATION WITH [name];  Surgeon: Thayer Headings, MD;  Location: [facility] CATH LAB;  Service: Cardiovascular;  Laterality: N/A;   MULTIPLE TOOTH EXTRACTIONS  2000   All upper teeth.  Has full dneture.    RENAL ARTERY STENT  2008   SHOULDER ARTHROSCOPY WITH ROTATOR CUFF REPAIR Right 09/27/2018   Procedure: Right shoulder mini open rotator cuff repair;  Surgeon: Susa Day, MD;  Location: [facility];  Service: Orthopedics;  Laterality: Right;  90 mins   thyroid cyst apiration  05/1997 and 07/1998   TONSILECTOMY, ADENOIDECTOMY, BILATERAL MYRINGOTOMY AND TUBES  1989   TONSILLECTOMY     TOTAL ABDOMINAL HYSTERECTOMY  1992     Current Outpatient Medications  Medication Sig Dispense Refill   Accu-Chek FastClix Lancets MISC Check blood sugar once daily PRN 100 each 1   albuterol (VENTOLIN HFA) 108 (90 Base) MCG/ACT inhaler Inhale 2 puffs into the lungs every 6 (six) hours as needed for wheezing or shortness of breath. 8 g 2   ALPRAZolam (XANAX) 0.25 MG tablet Take 1 tablet (0.25 mg total) by mouth at bedtime as needed for sleep. 90 tablet 3   aspirin EC 81 MG tablet Take 1 tablet (81 mg total) by mouth daily. 90 tablet 3   atorvastatin (LIPITOR) 80 MG tablet TAKE 1 TABLET BY MOUTH EVERY DAY (Patient taking differently: Take 80 mg by mouth every evening.) 90 tablet 3   B Complex-Biotin-FA (B-100 COMPLEX PO) Take 1 tablet by mouth every evening.     bismuth subsalicylate (PEPTO BISMOL) 262 MG/15ML suspension Take 30 mLs by mouth every 6 (six) hours as needed for indigestion or  diarrhea or loose stools.     cetirizine (ZYRTEC) 10 MG tablet Take 10 mg by mouth daily  as needed for allergies.     cholecalciferol (VITAMIN D3) 25 MCG (1000 UNIT) tablet Take 1,000 Units by mouth every evening.     CORDRAN 4 MCG/SQCM TAPE Apply 1 each topically daily as needed (Rash).      cycloSPORINE (RESTASIS) 0.05 % ophthalmic emulsion Place 1 drop into both eyes 2 (two) times daily.      gabapentin (NEURONTIN) 300 MG capsule Take 1 capsule (300 mg total) by mouth 3 (three) times daily. (Patient taking differently: Take 300 mg by mouth 2 (two) times daily.) 270 capsule 3   glucose blood (ACCU-CHEK SMARTVIEW) test strip CHECK BLOOD SUGAR ONCE DAILY AS NEEDED 100 strip 1   hydrochlorothiazide (MICROZIDE) 12.5 MG capsule Take 12.5 mg by mouth daily.     isosorbide mononitrate (IMDUR) 30 MG 24 hr tablet Take 3 tablets ( 90 mg ) every morning and 1 tablet (30 mg ) every evening 360 tablet 3   losartan (COZAAR) 100 MG tablet Take 1 tablet (100 mg total) by mouth daily. 90 tablet 3   Multiple Vitamins-Minerals (MULTIVITAMIN WITH MINERALS) tablet Take 1 tablet by mouth every evening.     nitroGLYCERIN (NITROSTAT) 0.4 MG SL tablet Place 1 tablet (0.4 mg total) under the tongue every 5 (five) minutes as needed for chest pain. 25 tablet 11   omeprazole (PRILOSEC) 40 MG capsule Take 1 capsule (40 mg total) by mouth in the morning and at bedtime. 180 capsule 3   oxyCODONE-acetaminophen (PERCOCET) 10-325 MG tablet Take one tablet three times a day as needed for back pain     prasugrel (EFFIENT) 5 MG TABS tablet TAKE 1 TABLET (5 MG TOTAL) BY MOUTH DAILY. 90 tablet 3   PRESCRIPTION MEDICATION Place 2 puffs into both ears daily as needed (ear infections). [facility] ear powder     Simethicone (GAS-X PO) Take 1-2 tablets by mouth daily as needed (gas).     tretinoin (RETIN-A) 0.1 % cream Apply 1 application. topically every evening.     ezetimibe (ZETIA) 10 MG tablet Take 1 tablet (10 mg total) by mouth daily. 90  tablet 3   No current facility-administered medications for this visit.    Allergies:   Brilinta [ticagrelor], Bextra [valdecoxib], Hydrocod poli-chlorphe poli er, Lipitor [atorvastatin], Mevacor [lovastatin], Plavix [clopidogrel bisulfate], Zocor [simvastatin], Doxycycline, Metoprolol, Morphine and related, Codeine, and Lisinopril    Social History:  The patient  reports that she quit smoking about 26 years ago. Her smoking use included cigarettes. She has never used smokeless tobacco. She reports current alcohol use of about 6.0 - 10.0 standard drinks of alcohol per week. She reports that she does not use drugs.   Family History:  The patient's family history includes AAA (abdominal aortic aneurysm) in her brother; Breast cancer in her sister; Colon cancer in her cousin and paternal grandmother; Diabetes in her brother and sister; Heart attack in her brother, father, mother, and sister; Heart disease in her brother, brother, father, mother, and sister; Hyperlipidemia in her brother and sister; Hypertension in her brother and sister; Liver cancer in her sister.    ROS:  Please see the history of present illness.   Otherwise, review of systems are positive for none.   All other systems are reviewed and negative.    PHYSICAL EXAM: VS:  BP 118/70 (BP Location: Left Arm, Patient Position: Sitting, Cuff Size: Normal)   Pulse 86   Resp 20   Ht '4\' 11"'$  (1.499 m)   Wt 128 lb 6.4 oz (58.2 kg)  SpO2 93%   BMI 25.93 kg/m  , BMI Body mass index is 25.93 kg/m. GENERAL:  Overweight  WF in NAD HEENT:  PERRL, EOMI, sclera are clear. Oropharynx is clear. NECK:  No jugular venous distention, carotid upstroke brisk and symmetric, no bruits, no thyromegaly or adenopathy LUNGS:  Clear to auscultation bilaterally CHEST:  Unremarkable HEART:  RRR,  PMI not displaced or sustained,S1 and S2 within normal limits, no S3, no S4: no clicks, no rubs, no murmurs ABD:  Soft, nontender. BS +, no masses or bruits. No  hepatomegaly, no splenomegaly EXT:  1 + pulses throughout, no edema, no cyanosis no clubbing SKIN:  Warm and dry.  No rashes NEURO:  Alert and oriented x 3. Cranial nerves II through XII intact. PSYCH:  Cognitively intact     EKG:  EKG is not done today.    Recent Labs: 11/09/2021: TSH 1.62 04/05/2022: ALT 16 06/29/2022: BNP 94.2; BUN 7; Creatinine, Ser 0.81; Hemoglobin 15.2; Magnesium 1.9; Platelets 272; Potassium 4.2; Sodium 143   Labs from primary care 12/15/16: A1c 5.6%. : Cholesterol 167 Trig-209, HDL 52, LDL 73.  AST 51, ALT 65. Normal renal indices. TSH is normal. Dated 06/24/17: CRP <0.3. Glucose 120, AST 58, ALT 80, other chemistries normal.  Dated 09/28/18: normal CBC, CMET  Lipid Panel    Component Value Date/Time   CHOL 142 04/05/2022 1005   TRIG 209 (H) 04/05/2022 1005   HDL 51 04/05/2022 1005   CHOLHDL 2.8 04/05/2022 1005   CHOLHDL 2.9 06/25/2021 0408   VLDL 20 06/25/2021 0408   LDLCALC 57 04/05/2022 1005   LDLDIRECT 82.0 06/13/2020 1017      Wt Readings from Last 3 Encounters:  07/07/22 128 lb 6.4 oz (58.2 kg)  07/05/22 128 lb (58.1 kg)  06/29/22 128 lb (58.1 kg)    Cardiac cath / PCI 02/02/21: CORONARY STENT INTERVENTION  LEFT HEART CATH AND CORS/GRAFTS ANGIOGRAPHY    Conclusion  Conclusions: Severe single-vessel coronary artery disease with chronic total occlusion of mid RCA. Mild to moderate, diffuse LAD disease with competitive flow in the distal vessel from LIMA-LAD. Small but patent LIMA-LAD. Patent SVG-rPDA-rPL with diffuse ostial/proximal disease of up to 60% and tandem hazy 95% and 90% mid graft lesions. Successful PCI to ostial/proximal and mid SVG-rPDA-rPL using non-overlapping Resolute Onyx 3.0 x 26 mm and 3.5 x 34 mm drug-eluting stents with 0% residual stenosis and TIMI-3 flow. Left forearm hematoma following sheath removal ant TR band placement, controlled with manual compression and placement of second TR band proximal to the first band.    Recommendations: Dual antiplatelet therapy with aspirin and ticagrelor for at least 12 months. Aggressive secondary prevention. Close monitoring of left forearm hematoma.   Nelva Bush, MD [facility]    Coronary Diagrams   Diagnostic Dominance: Right    Intervention     Implants       Echo 01/31/21: IMPRESSIONS     1. Left ventricular ejection fraction, by estimation, is 60 to 65%. The  left ventricle has normal function. The left ventricle has no regional  wall motion abnormalities. Left ventricular diastolic parameters are  consistent with Grade I diastolic  dysfunction (impaired relaxation).   2. Right ventricular systolic function is normal. The right ventricular  size is normal. There is normal pulmonary artery systolic pressure.   3. Left atrial size was severely dilated.   4. The mitral valve is grossly normal. Mild mitral valve regurgitation:  Apperance of two jets; study may underestimated regurgitation  severity.   5. The aortic valve is tricuspid. Aortic valve regurgitation is not  visualized. No aortic stenosis is present.   6. The inferior vena cava is normal in size with greater than 50%  respiratory variability, suggesting right atrial pressure of 3 mmHg.   Comparison(s): A prior study was performed on 01/09/21. No significant  change from prior study. Prior images reviewed side by side. Similar to  prior.    Cardiac cath 06/26/21: Procedures  CORONARY STENT INTERVENTION  Intravascular Ultrasound/IVUS  LEFT HEART CATH AND CORONARY ANGIOGRAPHY   Conclusion      Dist LM lesion is 15% stenosed.   Prox RCA lesion is 75% stenosed.   Mid RCA to Dist RCA lesion is 100% stenosed with 100% stenosed side branch in RPAV.   Origin to Prox Graft lesion before RPDA  is 80% stenosed.   Non-stenotic Mid Graft-1 lesion before RPDA was previously treated.   Non-stenotic Mid Graft-2 lesion before RPDA was previously treated.   A drug-eluting stent was  successfully placed using a STENT ONYX FRONTIER 3.5X26.   Post intervention, there is a 20% residual stenosis.   Seq SVG- and is large.   LIMA and is small.   There is competitive flow.   1.  Stable native vessel coronary artery disease with chronic total occlusion of the proximal to mid RCA, patency of the left main, diffuse mild to moderate calcific nonobstructive LAD stenosis, and moderate nonobstructive proximal left circumflex stenosis 2.  Continued patency of the LIMA to LAD 3.  Continued patency of the sequential saphenous vein graft to PDA and posterolateral branches with severe in-stent restenosis in the proximal stent, both lesions likely related to stent fracture 4.  Successful intravascular ultrasound-guided PCI of the saphenous vein graft to RCA using a 3.5 x 26 mm resolute Onyx DES postdilated to high pressure with a 4.0 mm noncompliant balloon.  Diagnostic Dominance: Right Intervention    Implants    Cardiac cath 09/03/21:  Early Osmond, MD (Primary)    Martin[place], Robi Dewolfe M, MD (Assisting)     Procedures  Intravascular Ultrasound/IVUS  LEFT HEART CATH AND CORS/GRAFTS ANGIOGRAPHY   Conclusion      Dist LM lesion is 15% stenosed.   Prox RCA lesion is 75% stenosed.   Mid RCA to Dist RCA lesion is 100% stenosed with 100% stenosed side branch in RPAV.   Origin to Prox Graft lesion before RPDA  is 20% stenosed.   Non-stenotic Mid Graft-1 lesion before RPDA was previously treated.   Non-stenotic Mid Graft-2 lesion before RPDA was previously treated.   Seq SVG- and is large.   LIMA and is small.   There is competitive flow.   LV end diastolic pressure is normal.    Patent vein graft to PDA with minimal constraint; IVUS interrogation demonstrated a minimal stent area of 5.31m2. Patent LIMA to LAD with relatively unchanged burden of disease elsewhere. Normal LVEDP   Recommendations:  Medical management.    ASSESSMENT AND PLAN:  1. CAD status post CABG in 1995.   s/p NSTEMI in Feb. Cardiac cath demonstrated patent LIMA to the LAD. Occluded native RCA. SVG to PDA/PL had severe sequential stenoses treated with DES x 2. Normal EF.  Developed dyspnea on Brilinta. Allergic reaction to Plavix. Now on Prasugrel lower dose due to age and female sex. Had recurrent angina in July. Restenosis in the proximal stent in the SVG to RCA with some associated stent fracture. Had repeat stenting with IVUS guidance.  Now 2 months later had recurrent angina on optimal medical therapy. Fortunately repeat cath showed the stent was still patent. We did discuss that if she had recurrence we would try Shockwave therapy. Will continue RX. 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 I suspect her recent symptoms were exacerbated by her URI. She is getting better. We discussed what to do if chest pain dose not resolve- go to ED. I think she will know when repeat cath is needed.  2. HTN with hypertensive heart disease. BP is under  good control.   3. Hypercholesterolemia- LDL 57 on statin and Zetia.   4.  Status post stent to right renal artery 2008. Patent by CT in October  5. Status post stent graft to abdominal aortic aneurysm 2009.  S/p iliac stent in 2016. Stable by CT in October  6. Peripheral artery disease followed by Dr.Cain  7. Lumbar spine disease. S/p fusion with persistent pain. CT planned with Dr Ronnald Ramp.   Dispostion: follow up in 4 months.   Signed, Inaki Vantine Martinique MD, [facility]    07/07/2022 3:48 PM    [place]

## 2022-07-05 ENCOUNTER — Encounter: Payer: Self-pay | Admitting: Gastroenterology

## 2022-07-05 ENCOUNTER — Ambulatory Visit: Payer: Medicare Other | Admitting: Gastroenterology

## 2022-07-05 VITALS — BP 130/60 | HR 83 | Ht 59.0 in | Wt 128.0 lb

## 2022-07-05 DIAGNOSIS — R109 Unspecified abdominal pain: Secondary | ICD-10-CM

## 2022-07-05 DIAGNOSIS — K219 Gastro-esophageal reflux disease without esophagitis: Secondary | ICD-10-CM | POA: Diagnosis not present

## 2022-07-05 MED ORDER — OMEPRAZOLE 40 MG PO CPDR: 40.0000 mg | DELAYED_RELEASE_CAPSULE | Freq: Two times a day (BID) | ORAL | 3 refills | Status: DC

## 2022-07-05 NOTE — Progress Notes (Signed)
    Assessment     GERD Constipation, mild intermittent left-sided abdominal pain History of diverticulitis Personal history of adenomatous colon polyps, no longer in surveillance   Recommendations    Continue omeprazole 40 mg po bid and antireflux measures.  Start Miralax daily, titrate dose between one half capful and 2 capfuls daily for an adequate bowel movement daily. REV in 1 year   HPI    This is a 76 year old Megan [name] [name] returning for follow-up of GERD.  Her reflux symptoms remain under good control on omeprazole twice daily.  She relates frequent troubles with constipation and intermittent left-sided abdominal pain that lasts for 20 to 30 minutes at a time every several weeks.  She feels her intermittent left-sided abdominal pain is associated with her constipation.  She takes a stool softener for constipation which has not been helpful.  She was hospitalized in February 2022 for diverticulitis with hyponatremia and AKI.   Labs / Imaging       Latest Ref Rng & Units 04/05/2022   11:50 AM 03/19/2022    9:16 AM 01/06/2022    9:54 AM  Hepatic Function  Total Protein 6.0 - 8.5 g/dL 5.3  5.7  5.7   Albumin 3.7 - 4.7 g/dL 3.7  3.3  3.9   AST 0 - 40 IU/L $Remov'18  24  18   'xeCZJO$ ALT 0 - 32 IU/L $Remov'16  25  16   'bOSZhn$ Alk Phosphatase 44 - 121 IU/L 74  65  87   Total Bilirubin 0.0 - 1.2 mg/dL 0.4  0.9  0.6   Bilirubin, Direct 0.00 - 0.40 mg/dL 0.16   0.21        Latest Ref Rng & Units 06/29/2022    3:38 PM 03/19/2022    9:16 AM 01/06/2022    9:54 AM  CBC  WBC 3.4 - 10.8 x10E3/uL 9.7  8.3  6.6   Hemoglobin 11.1 - 15.9 g/dL 15.2  13.8  14.2   Hematocrit Megan [name].0 - 46.6 % 45.3  43.3  42.8   Platelets 150 - 450 x10E3/uL 272  207  214      DG Chest 2 View  Result Date: 06/30/2022 CLINICAL DATA:  Shortness of breath EXAM: CHEST - 2 VIEW COMPARISON:  Chest radiograph August 27, 2021 FINDINGS: Stable cardiac and mediastinal contours status post median sternotomy. Mitral annular calcifications. Aortic  vascular calcifications. No large area pulmonary consolidation. No pleural effusion or pneumothorax. Thoracic spine degenerative changes. Abdominal aortic graft. Lumbar spinal fusion hardware. IMPRESSION: No active cardiopulmonary disease. Electronically Signed   By: Lovey Newcomer M.D.   On: 06/30/2022 17:41    Current Medications, Allergies, Past Medical History, Past Surgical History, Family History and Social History were reviewed in [employer] record.   Physical Exam: General: Well developed, well nourished, no acute distress Head: Normocephalic and atraumatic Eyes: Sclerae anicteric, EOMI Ears: Normal auditory acuity Mouth: Not examined Lungs: Clear throughout to auscultation Heart: Regular rate and rhythm; no murmurs, rubs or bruits Abdomen: Soft, non tender and non distended. No masses, hepatosplenomegaly or hernias noted. Normal Bowel sounds Rectal: Not done Musculoskeletal: Symmetrical with no gross deformities  Pulses:  Normal pulses noted Extremities: No clubbing, cyanosis, edema or deformities noted Neurological: Alert oriented x 4, grossly nonfocal Psychological:  Alert and cooperative. Normal mood and affect   [name] Plan, MD 07/05/2022, 2:11 PM

## 2022-07-05 NOTE — Patient Instructions (Signed)
We have sent the following medications to your pharmacy for you to pick up at your convenience: omeprazole.  ? ?The [facility] providers would like to encourage you to use MYCHART to communicate with providers for non-urgent requests or questions.  Due to long hold times on the telephone, sending your provider a message by MYCHART may be a faster and more efficient way to get a response.  Please allow 48 business hours for a response.  Please remember that this is for non-urgent requests.  ? ?Thank you for choosing me and [facility]. ? ?Malcolm T. Stark, Jr., MD., FACG ? ? ?

## 2022-07-07 ENCOUNTER — Encounter: Payer: Self-pay | Admitting: Cardiology

## 2022-07-07 ENCOUNTER — Ambulatory Visit: Payer: Medicare Other | Admitting: Cardiology

## 2022-07-07 VITALS — BP 118/70 | HR 86 | Resp 20 | Ht 59.0 in | Wt 128.4 lb

## 2022-07-07 DIAGNOSIS — I10 Essential (primary) hypertension: Secondary | ICD-10-CM | POA: Diagnosis not present

## 2022-07-07 DIAGNOSIS — I25118 Atherosclerotic heart disease of native coronary artery with other forms of angina pectoris: Secondary | ICD-10-CM | POA: Diagnosis not present

## 2022-07-07 DIAGNOSIS — E785 Hyperlipidemia, unspecified: Secondary | ICD-10-CM | POA: Diagnosis not present

## 2022-07-08 ENCOUNTER — Encounter: Payer: Self-pay | Admitting: Cardiology

## 2022-07-11 ENCOUNTER — Emergency Department (HOSPITAL_COMMUNITY): Payer: Medicare Other

## 2022-07-11 ENCOUNTER — Emergency Department (HOSPITAL_COMMUNITY)
Admission: EM | Admit: 2022-07-11 | Discharge: 2022-07-12 | Disposition: A | Discharge status: Home / Self Care | Payer: Medicare Other | Attending: Emergency Medicine | Admitting: Emergency Medicine

## 2022-07-11 DIAGNOSIS — J449 Chronic obstructive pulmonary disease, unspecified: Secondary | ICD-10-CM | POA: Insufficient documentation

## 2022-07-11 DIAGNOSIS — I2510 Atherosclerotic heart disease of native coronary artery without angina pectoris: Secondary | ICD-10-CM | POA: Diagnosis not present

## 2022-07-11 DIAGNOSIS — Z951 Presence of aortocoronary bypass graft: Secondary | ICD-10-CM | POA: Insufficient documentation

## 2022-07-11 DIAGNOSIS — J9811 Atelectasis: Secondary | ICD-10-CM | POA: Diagnosis not present

## 2022-07-11 DIAGNOSIS — Z7982 Long term (current) use of aspirin: Secondary | ICD-10-CM | POA: Diagnosis not present

## 2022-07-11 DIAGNOSIS — R079 Chest pain, unspecified: Secondary | ICD-10-CM | POA: Diagnosis not present

## 2022-07-11 DIAGNOSIS — R0602 Shortness of breath: Secondary | ICD-10-CM | POA: Diagnosis not present

## 2022-07-11 DIAGNOSIS — R072 Precordial pain: Secondary | ICD-10-CM

## 2022-07-11 DIAGNOSIS — J439 Emphysema, unspecified: Secondary | ICD-10-CM | POA: Diagnosis not present

## 2022-07-11 DIAGNOSIS — R0789 Other chest pain: Secondary | ICD-10-CM | POA: Diagnosis not present

## 2022-07-11 LAB — BASIC METABOLIC PANEL
Anion gap: 8 (ref 5–15)
BUN: 10 mg/dL (ref 8–23)
CO2: 24 mmol/L (ref 22–32)
Calcium: 9.2 mg/dL (ref 8.9–10.3)
Chloride: 108 mmol/L (ref 98–111)
Creatinine, Ser: 0.74 mg/dL (ref 0.44–1.00)
GFR, Estimated: >60 mL/min (ref >60)
Glucose, Bld: 109 mg/dL — ABNORMAL HIGH (ref 70–99)
Potassium: 3.6 mmol/L (ref 3.5–5.1)
Sodium: 140 mmol/L (ref 135–145)

## 2022-07-11 LAB — CBC
HCT: 40.7 % (ref 36.0–46.0)
Hemoglobin: 13.8 g/dL (ref 12.0–15.0)
MCH: 29.5 pg (ref 26.0–34.0)
MCHC: 33.9 g/dL (ref 30.0–36.0)
MCV: 87 fL (ref 80.0–100.0)
Platelets: 221 10*3/uL (ref 150–400)
RBC: 4.68 MIL/uL (ref 3.87–5.11)
RDW: 13 % (ref 11.5–15.5)
WBC: 9.8 10*3/uL (ref 4.0–10.5)
nRBC: 0 % (ref 0.0–0.2)

## 2022-07-11 LAB — TROPONIN I (HIGH SENSITIVITY): Troponin I (High Sensitivity): 7 ng/L (ref <18)

## 2022-07-11 NOTE — ED Notes (Signed)
Patient bp was high, notified triage nurse.

## 2022-07-11 NOTE — ED Triage Notes (Signed)
Pt c/o CP, SHOB x1.5wks, states she feels something might be wrong w stents. Pt endorses "aching" of heart, associated pressure into L side of neck. 1 NTG 1.5hrs ago. Hx COPD, HTN, MI, stents, CABG

## 2022-07-12 ENCOUNTER — Other Ambulatory Visit: Payer: Self-pay

## 2022-07-12 ENCOUNTER — Encounter: Payer: Self-pay | Admitting: Cardiology

## 2022-07-12 ENCOUNTER — Encounter (HOSPITAL_COMMUNITY): Payer: Self-pay | Admitting: Emergency Medicine

## 2022-07-12 ENCOUNTER — Emergency Department (HOSPITAL_COMMUNITY): Payer: Medicare Other

## 2022-07-12 DIAGNOSIS — R0602 Shortness of breath: Secondary | ICD-10-CM | POA: Diagnosis not present

## 2022-07-12 DIAGNOSIS — R072 Precordial pain: Secondary | ICD-10-CM

## 2022-07-12 DIAGNOSIS — I25708 Atherosclerosis of coronary artery bypass graft(s), unspecified, with other forms of angina pectoris: Secondary | ICD-10-CM

## 2022-07-12 DIAGNOSIS — Z01812 Encounter for preprocedural laboratory examination: Secondary | ICD-10-CM

## 2022-07-12 DIAGNOSIS — R079 Chest pain, unspecified: Secondary | ICD-10-CM | POA: Diagnosis not present

## 2022-07-12 DIAGNOSIS — J9811 Atelectasis: Secondary | ICD-10-CM | POA: Diagnosis not present

## 2022-07-12 DIAGNOSIS — J439 Emphysema, unspecified: Secondary | ICD-10-CM | POA: Diagnosis not present

## 2022-07-12 LAB — TROPONIN I (HIGH SENSITIVITY): Troponin I (High Sensitivity): 6 ng/L (ref <18)

## 2022-07-12 MED ORDER — METOPROLOL SUCCINATE ER 25 MG PO TB24: 25.0000 mg | ORAL_TABLET | Freq: Every day | ORAL | 3 refills | Status: DC

## 2022-07-12 MED ORDER — IOHEXOL 350 MG/ML SOLN
70.0000 mL | Freq: Once | INTRAVENOUS | Status: AC | PRN
  Administered 2022-07-12: 70 mL via INTRAVENOUS

## 2022-07-12 NOTE — Progress Notes (Signed)
  [place] [address] [address] Dept: [phone] Loc: [phone]  ROSANNA BICKLE  07/12/2022  You are scheduled for a Cardiac Catheterization on Thursday, August 10 with Dr. [name].  1. Please arrive at the Main Entrance A at [facility]: [place], [place] [address] at 11:30 AM (This time is two hours before your procedure to ensure your preparation). Free valet parking service is available.   Special note: Every effort is made to have your procedure done on time. Please understand that emergencies sometimes delay scheduled procedures.  2. Diet: Do not eat solid foods after midnight.  You may have clear liquids until 5 AM upon the day of the procedure.  3. Labs: You will need to have blood drawn on  Tuesday 07/13/22 at [place] office [place] do not need to be fasting.  4. Medication instructions in preparation for your procedure:       On the morning of your procedure, take Aspirin and Effient and any morning medicines NOT listed above.  You may use sips of water.  5. Plan to go home the same day, you will only stay overnight if medically necessary. 6. You MUST have a responsible adult to drive you home. 7. An adult MUST be with you the first 24 hours after you arrive home. 8. Bring a current list of your medications, and the last time and date medication taken. 9. Bring ID and current insurance cards. 10.Please wear clothes that are easy to get on and off and wear slip-on shoes.  Thank you for allowing [place] to care for you!   -- [facility]

## 2022-07-12 NOTE — ED Provider Notes (Signed)
[facility] EMERGENCY DEPARTMENT Provider Note   CSN: [id number] Arrival date & time: 07/11/22  1903     History  Chief Complaint  Patient presents with   Chest Pain   Shortness of Breath    Megan [name] is a 76 y.o. female.  The history is provided by the patient.  Chest Pain Pain quality: aching   Pain severity:  Moderate Onset quality:  Gradual Duration:  2 weeks Timing:  Intermittent Progression:  Waxing and waning Chronicity:  New Context: not breathing   Relieved by:  Nothing Worsened by:  Nothing Ineffective treatments:  None tried Associated symptoms: no fever   Risk factors: no aortic disease   Patient with CAD with stents and previous CABG who presents with achy chest pain and SOB.  Started 2 weeks ago and patient thought she had covid but had 3 normal tests.  Saw her cardiologist Dr. Martinique who also did a work up for this.  Today, patient has had ~ 12 hours of ongoing pain.       Home Medications Prior to Admission medications   Medication Sig Start Date End Date Taking? Authorizing Provider  Accu-Chek FastClix Lancets MISC Check blood sugar once daily PRN 07/30/19   [name], MD  albuterol (VENTOLIN HFA) 108 (90 Base) MCG/ACT inhaler Inhale 2 puffs into the lungs every 6 (six) hours as needed for wheezing or shortness of breath. 01/11/22   Martinique, Peter M, MD  ALPRAZolam Duanne Moron) 0.25 MG tablet Take 1 tablet (0.25 mg total) by mouth at bedtime as needed for sleep. 09/15/20   Martinique, Peter M, MD  aspirin EC 81 MG tablet Take 1 tablet (81 mg total) by mouth daily. 05/09/20   Martinique, Peter M, MD  atorvastatin (LIPITOR) 80 MG tablet TAKE 1 TABLET BY MOUTH EVERY DAY Patient taking differently: Take 80 mg by mouth every evening. 08/11/21   Martinique, Peter M, MD  B Complex-Biotin-FA (B-100 COMPLEX PO) Take 1 tablet by mouth every evening.    [provider]  bismuth subsalicylate (PEPTO BISMOL) 262 MG/15ML suspension Take 30 mLs by mouth  every 6 (six) hours as needed for indigestion or diarrhea or loose stools.    [provider]  cetirizine (ZYRTEC) 10 MG tablet Take 10 mg by mouth daily as needed for allergies.    [provider]  cholecalciferol (VITAMIN D3) 25 MCG (1000 UNIT) tablet Take 1,000 Units by mouth every evening.    [provider]  CORDRAN 4 MCG/SQCM TAPE Apply 1 each topically daily as needed (Rash).  05/17/19   [provider]  cycloSPORINE (RESTASIS) 0.05 % ophthalmic emulsion Place 1 drop into both eyes 2 (two) times daily.     [provider]  ezetimibe (ZETIA) 10 MG tablet Take 1 tablet (10 mg total) by mouth daily. 01/07/22 04/07/22  Martinique, Peter M, MD  gabapentin (NEURONTIN) 300 MG capsule Take 1 capsule (300 mg total) by mouth 3 (three) times daily. Patient taking differently: Take 300 mg by mouth 2 (two) times daily. 09/02/21   Lomax, Amy, NP  glucose blood (ACCU-CHEK SMARTVIEW) test strip CHECK BLOOD SUGAR ONCE DAILY AS NEEDED 01/17/20   [name], MD  hydrochlorothiazide (MICROZIDE) 12.5 MG capsule Take 12.5 mg by mouth daily.    [provider]  isosorbide mononitrate (IMDUR) 30 MG 24 hr tablet Take 3 tablets ( 90 mg ) every morning and 1 tablet (30 mg ) every evening 06/29/22   Laurann Montana  S, NP  losartan (COZAAR) 100 MG tablet Take 1 tablet (100 mg total) by mouth daily. 06/21/22   [name], MD  Multiple Vitamins-Minerals (MULTIVITAMIN WITH MINERALS) tablet Take 1 tablet by mouth every evening.    [provider]  nitroGLYCERIN (NITROSTAT) 0.4 MG SL tablet Place 1 tablet (0.4 mg total) under the tongue every 5 (five) minutes as needed for chest pain. 01/09/20   Martinique, Peter M, MD  omeprazole (PRILOSEC) 40 MG capsule Take 1 capsule (40 mg total) by mouth in the morning and at bedtime. 07/05/22   Ladene Artist, MD  oxyCODONE-acetaminophen (PERCOCET) 10-325 MG tablet Take one tablet three times a day as needed for back pain    [provider]  prasugrel (EFFIENT) 5 MG TABS tablet TAKE 1 TABLET (5 MG TOTAL) BY MOUTH DAILY. 05/04/22   Martinique, Peter M, MD  PRESCRIPTION MEDICATION Place 2 puffs into both ears daily as needed (ear infections). [facility] ear powder    [provider]  Simethicone (GAS-X PO) Take 1-2 tablets by mouth daily as needed (gas).    [provider]  tretinoin (RETIN-A) 0.1 % cream Apply 1 application. topically every evening.    [provider]      Allergies    Brilinta [ticagrelor], Bextra [valdecoxib], Hydrocod poli-chlorphe poli er, Lipitor [atorvastatin], Mevacor [lovastatin], Plavix [clopidogrel bisulfate], Zocor [simvastatin], Doxycycline, Metoprolol, Morphine and related, Codeine, and Lisinopril    Review of Systems   Review of Systems  Constitutional:  Negative for fever.  HENT:  Negative for facial swelling.   Cardiovascular:  Positive for chest pain.  All other systems reviewed and are negative.   Physical Exam Updated Vital Signs BP (!) 156/72   Pulse 70   Temp 98.3 F (36.8 C) (Oral)   Resp 12   SpO2 95%  Physical Exam Vitals and nursing note reviewed.  Constitutional:      General: She is not in acute distress.    Appearance: She is well-developed.  HENT:     Head: Normocephalic and atraumatic.     Nose: Nose normal.  Eyes:     Pupils: Pupils are equal, round, and reactive to light.  Cardiovascular:     Rate and Rhythm: Normal rate and regular rhythm.     Pulses: Normal pulses.     Heart sounds: Normal heart sounds.  Pulmonary:     Effort: Pulmonary effort is normal. No respiratory distress.     Breath sounds: Normal breath sounds.  Abdominal:     General: Bowel sounds are normal. There is no distension.     Palpations: Abdomen is soft.     Tenderness: There is no abdominal tenderness. There is no guarding or rebound.  Genitourinary:    Vagina: No vaginal discharge.  Musculoskeletal:        General: Normal range of motion.      Cervical back: Normal range of motion and neck supple.  Skin:    General: Skin is dry.     Capillary Refill: Capillary refill takes less than 2 seconds.     Findings: No erythema or rash.  Neurological:     General: No focal deficit present.     Mental Status: She is oriented to person, place, and time.     Deep Tendon Reflexes: Reflexes normal.  Psychiatric:        Mood and Affect: Mood normal.        Behavior: Behavior normal.     ED Results /  Procedures / Treatments   Labs (all labs ordered are listed, but only abnormal results are displayed) Results for orders placed or performed during the hospital encounter of 35/46/56  Basic metabolic panel  Result Value Ref Range   Sodium 140 135 - 145 mmol/L   Potassium 3.6 3.5 - 5.1 mmol/L   Chloride 108 98 - 111 mmol/L   CO2 24 22 - 32 mmol/L   Glucose, Bld 109 (H) 70 - 99 mg/dL   BUN 10 8 - 23 mg/dL   Creatinine, Ser 0.74 0.44 - 1.00 mg/dL   Calcium 9.2 8.9 - 10.3 mg/dL   GFR, Estimated >60 >60 mL/min   Anion gap 8 5 - 15  CBC  Result Value Ref Range   WBC 9.8 4.0 - 10.5 K/uL   RBC 4.68 3.87 - 5.11 MIL/uL   Hemoglobin 13.8 12.0 - 15.0 g/dL   HCT 40.7 36.0 - 46.0 %   MCV 87.0 80.0 - 100.0 fL   MCH 29.5 26.0 - 34.0 pg   MCHC 33.9 30.0 - 36.0 g/dL   RDW 13.0 11.5 - 15.5 %   Platelets 221 150 - 400 K/uL   nRBC 0.0 0.0 - 0.2 %  Troponin I (High Sensitivity)  Result Value Ref Range   Troponin I (High Sensitivity) 7 <18 ng/L  Troponin I (High Sensitivity)  Result Value Ref Range   Troponin I (High Sensitivity) 6 <18 ng/L   CT Angio Chest PE W and/or Wo Contrast  Result Date: 07/12/2022 CLINICAL DATA:  Chest pain, shortness of breath. EXAM: CT ANGIOGRAPHY CHEST WITH CONTRAST TECHNIQUE: Multidetector CT imaging of the chest was performed using the standard protocol during bolus administration of intravenous contrast. Multiplanar CT image reconstructions and MIPs were obtained to evaluate the vascular anatomy. RADIATION DOSE  REDUCTION: This exam was performed according to the departmental dose-optimization program which includes automated exposure control, adjustment of the mA and/or kV according to patient size and/or use of iterative reconstruction technique. CONTRAST:  82m OMNIPAQUE IOHEXOL 350 MG/ML SOLN COMPARISON:  06/25/2021. FINDINGS: Cardiovascular: The heart is mildly enlarged and there is no pericardial effusion. Three-vessel coronary artery calcifications are noted. There is atherosclerotic calcification of the aorta without evidence of aneurysm. The pulmonary trunk is distended suggesting underlying pulmonary artery hypertension. 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 No pulmonary artery filling defect. Mediastinum/Nodes: No mediastinal, hilar, or axillary lymphadenopathy. A stable hypodense nodule is noted in the right lobe of the thyroid gland measuring 2.1 cm, previously assessed by ultrasound. The trachea and esophagus are within normal limits. Lungs/Pleura: Advanced emphysematous changes are present in the lungs. Mild atelectasis is noted bilaterally. No consolidation, effusion, or pneumothorax. Upper Abdomen: No acute abnormality. Musculoskeletal: Sternotomy wires are noted over the midline. No acute or suspicious osseous abnormality. Review of the MIP images confirms the above findings. IMPRESSION: 1. No evidence of pulmonary embolism or other acute process. 2. Emphysema. 3. Distended pulmonary trunk suggesting underlying pulmonary artery hypertension. 4. Coronary artery calcifications. 5. Aortic atherosclerosis. Electronically Signed   By: LBrett FairyM.D.   On: 07/12/2022 03:51   DG Chest 2 View  Result Date: 07/11/2022 CLINICAL DATA:  CP EXAM: CHEST - 2 VIEW COMPARISON:  June 29, 2022 FINDINGS: The cardiomediastinal silhouette is unchanged in contour.Status post median sternotomy and CABG. Atherosclerotic calcifications of the aorta. No pleural  effusion. No pneumothorax. No acute pleuroparenchymal abnormality. Stent graft of the abdominal aorta. Status post posterior fixation of  the lumbar spine, partially visualized. IMPRESSION: No acute cardiopulmonary abnormality. Electronically Signed   By: Valentino Saxon M.D.   On: 07/11/2022 19:38   DG Chest 2 View  Result Date: 06/30/2022 CLINICAL DATA:  Shortness of breath EXAM: CHEST - 2 VIEW COMPARISON:  Chest radiograph August 27, 2021 FINDINGS: Stable cardiac and mediastinal contours status post median sternotomy. Mitral annular calcifications. Aortic vascular calcifications. No large area pulmonary consolidation. No pleural effusion or pneumothorax. Thoracic spine degenerative changes. Abdominal aortic graft. Lumbar spinal fusion hardware. IMPRESSION: No active cardiopulmonary disease. Electronically Signed   By: Lovey Newcomer M.D.   On: 06/30/2022 17:41     EKG EKG Interpretation  Date/Time:  Sunday July 11 2022 19:13:55 EDT Ventricular Rate:  79 PR Interval:  166 QRS Duration: 92 QT Interval:  392 QTC Calculation: 449 R Axis:   46 Text Interpretation: Normal sinus rhythm Nonspecific ST abnormality Confirmed by Randal Buba, Heath Badon ([id number]) on 07/12/2022 12:53:25 AM  Radiology CT Angio Chest PE W and/or Wo Contrast  Result Date: 07/12/2022 CLINICAL DATA:  Chest pain, shortness of breath. EXAM: CT ANGIOGRAPHY CHEST WITH CONTRAST TECHNIQUE: Multidetector CT imaging of the chest was performed using the standard protocol during bolus administration of intravenous contrast. Multiplanar CT image reconstructions and MIPs were obtained to evaluate the vascular anatomy. RADIATION DOSE REDUCTION: This exam was performed according to the departmental dose-optimization program which includes automated exposure control, adjustment of the mA and/or kV according to patient size and/or use of iterative reconstruction technique. CONTRAST:  98m OMNIPAQUE IOHEXOL 350 MG/ML SOLN COMPARISON:  06/25/2021.  FINDINGS: Cardiovascular: The heart is mildly enlarged and there is no pericardial effusion. Three-vessel coronary artery calcifications are noted. There is atherosclerotic calcification of the aorta without evidence of aneurysm. The pulmonary trunk is distended suggesting underlying pulmonary artery hypertension. 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 No pulmonary artery filling defect. Mediastinum/Nodes: No mediastinal, hilar, or axillary lymphadenopathy. A stable hypodense nodule is noted in the right lobe of the thyroid gland measuring 2.1 cm, previously assessed by ultrasound. The trachea and esophagus are within normal limits. Lungs/Pleura: Advanced emphysematous changes are present in the lungs. Mild atelectasis is noted bilaterally. No consolidation, effusion, or pneumothorax. Upper Abdomen: No acute abnormality. Musculoskeletal: Sternotomy wires are noted over the midline. No acute or suspicious osseous abnormality. Review of the MIP images confirms the above findings. IMPRESSION: 1. No evidence of pulmonary embolism or other acute process. 2. Emphysema. 3. Distended pulmonary trunk suggesting underlying pulmonary artery hypertension. 4. Coronary artery calcifications. 5. Aortic atherosclerosis. Electronically Signed   By: LBrett FairyM.D.   On: 07/12/2022 03:51   DG Chest 2 View  Result Date: 07/11/2022 CLINICAL DATA:  CP EXAM: CHEST - 2 VIEW COMPARISON:  June 29, 2022 FINDINGS: The cardiomediastinal silhouette is unchanged in contour.Status post median sternotomy and CABG. Atherosclerotic calcifications of the aorta. No pleural effusion. No pneumothorax. No acute pleuroparenchymal abnormality. Stent graft of the abdominal aorta. Status post posterior fixation of the lumbar spine, partially visualized. IMPRESSION: No acute cardiopulmonary abnormality. Electronically Signed   By: SValentino SaxonM.D.   On: 07/11/2022 19:38     Procedures Procedures    Medications Ordered in ED Medications  iohexol (OMNIPAQUE) 350 MG/ML injection 70 mL (70 mLs Intravenous Contrast Given 07/12/22 0336)    ED Course/ Medical Decision Making/ A&P  Medical Decision Making CP and SOB for 2 weeks   Amount and/or Complexity of Data Reviewed External Data Reviewed: notes.    Details: previous notes reviewed Labs: ordered.    Details: all labs reviewed: Normal sodium and potassium.  Glucose slightly elevated 109.  Normal white count 9.8, hemoglobin 13.8 normal platelet 221K.  First troponin normal 7 second troponin normal 6 Radiology: ordered and independent interpretation performed.    Details: Negative CTA for PE by me ECG/medicine tests: ordered and independent interpretation performed. Decision-making details documented in ED Course. Discussion of management or test interpretation with external provider(s): Case d/w Dr. Marcelle Smiling of cardiology. All labs and imaging reviewed.  Not cardiac at this time.  Patient is stable for discharge at this time.  Follow up with Dr. Martinique.    Risk Prescription drug management. Risk Details: Ruled out for MI and PE in the ED.  I suspect this is related to COPD.  Patient is not currently using her inhalers and I have advised patient to start using those medications.  I will refer to pulmonology as an outpatient.  Please also follow up with Dr. Martinique for ongoing care.      Final Clinical Impression(s) / ED Diagnoses Final diagnoses:  Precordial pain  Chronic obstructive pulmonary disease, unspecified COPD type ([place])   Return for intractable cough, coughing up blood, fevers > 100.4 unrelieved by medication, shortness of breath, intractable vomiting, chest pain, shortness of breath, weakness, numbness, changes in speech, facial asymmetry, abdominal pain, passing out, Inability to tolerate liquids or food, cough, altered mental status or any concerns. No signs of  systemic illness or infection. The patient is nontoxic-appearing on exam and vital signs are within normal limits.  I have reviewed the triage vital signs and the nursing notes. Pertinent labs & imaging results that were available during my care of the patient were reviewed by me and considered in my medical decision making (see chart for details). After history, exam, and medical workup I feel the patient has been appropriately medically screened and is safe for discharge home. Pertinent diagnoses were discussed with the patient. Patient was given return precautions.    Rx / DC Orders ED Discharge Orders     None         Ruhan Borak, MD 07/12/22 3159

## 2022-07-13 ENCOUNTER — Other Ambulatory Visit: Payer: Self-pay

## 2022-07-13 ENCOUNTER — Other Ambulatory Visit: Payer: Self-pay | Admitting: Cardiology

## 2022-07-13 DIAGNOSIS — I25708 Atherosclerosis of coronary artery bypass graft(s), unspecified, with other forms of angina pectoris: Secondary | ICD-10-CM

## 2022-07-13 DIAGNOSIS — Z01812 Encounter for preprocedural laboratory examination: Secondary | ICD-10-CM

## 2022-07-13 DIAGNOSIS — I209 Angina pectoris, unspecified: Secondary | ICD-10-CM

## 2022-07-13 DIAGNOSIS — Z01818 Encounter for other preprocedural examination: Secondary | ICD-10-CM | POA: Diagnosis not present

## 2022-07-13 LAB — PT AND PTT
INR: 1 (ref 0.9–1.2)
Prothrombin Time: 11.3 s (ref 9.1–12.0)
aPTT: 30 s (ref 24–33)

## 2022-07-13 MED ORDER — SODIUM CHLORIDE 0.9% FLUSH: 3.0000 mL | Freq: Two times a day (BID) | INTRAVENOUS | Status: DC

## 2022-07-13 NOTE — Progress Notes (Signed)
pt

## 2022-07-15 ENCOUNTER — Other Ambulatory Visit: Payer: Self-pay

## 2022-07-15 ENCOUNTER — Ambulatory Visit (HOSPITAL_COMMUNITY)
Admission: RE | Admit: 2022-07-15 | Discharge: 2022-07-15 | Disposition: A | Discharge status: Home / Self Care | Payer: Medicare Other | Attending: Cardiology | Admitting: Cardiology

## 2022-07-15 ENCOUNTER — Encounter (HOSPITAL_COMMUNITY)
Admission: RE | Disposition: A | Discharge status: Home / Self Care | Payer: Self-pay | Source: Home / Self Care | Attending: Cardiology

## 2022-07-15 DIAGNOSIS — I2582 Chronic total occlusion of coronary artery: Secondary | ICD-10-CM | POA: Diagnosis not present

## 2022-07-15 DIAGNOSIS — Z7902 Long term (current) use of antithrombotics/antiplatelets: Secondary | ICD-10-CM | POA: Diagnosis not present

## 2022-07-15 DIAGNOSIS — I2584 Coronary atherosclerosis due to calcified coronary lesion: Secondary | ICD-10-CM | POA: Diagnosis not present

## 2022-07-15 DIAGNOSIS — Z79899 Other long term (current) drug therapy: Secondary | ICD-10-CM | POA: Diagnosis not present

## 2022-07-15 DIAGNOSIS — I739 Peripheral vascular disease, unspecified: Secondary | ICD-10-CM | POA: Insufficient documentation

## 2022-07-15 DIAGNOSIS — I25119 Atherosclerotic heart disease of native coronary artery with unspecified angina pectoris: Secondary | ICD-10-CM | POA: Diagnosis not present

## 2022-07-15 DIAGNOSIS — Z955 Presence of coronary angioplasty implant and graft: Secondary | ICD-10-CM | POA: Diagnosis not present

## 2022-07-15 DIAGNOSIS — I714 Abdominal aortic aneurysm, without rupture, unspecified: Secondary | ICD-10-CM | POA: Insufficient documentation

## 2022-07-15 DIAGNOSIS — M489 Spondylopathy, unspecified: Secondary | ICD-10-CM | POA: Diagnosis not present

## 2022-07-15 DIAGNOSIS — I701 Atherosclerosis of renal artery: Secondary | ICD-10-CM | POA: Diagnosis not present

## 2022-07-15 DIAGNOSIS — I25709 Atherosclerosis of coronary artery bypass graft(s), unspecified, with unspecified angina pectoris: Secondary | ICD-10-CM | POA: Insufficient documentation

## 2022-07-15 DIAGNOSIS — I252 Old myocardial infarction: Secondary | ICD-10-CM | POA: Insufficient documentation

## 2022-07-15 DIAGNOSIS — E785 Hyperlipidemia, unspecified: Secondary | ICD-10-CM | POA: Diagnosis not present

## 2022-07-15 DIAGNOSIS — I119 Hypertensive heart disease without heart failure: Secondary | ICD-10-CM | POA: Diagnosis not present

## 2022-07-15 DIAGNOSIS — I209 Angina pectoris, unspecified: Secondary | ICD-10-CM

## 2022-07-15 DIAGNOSIS — Z7982 Long term (current) use of aspirin: Secondary | ICD-10-CM | POA: Insufficient documentation

## 2022-07-15 DIAGNOSIS — Z87891 Personal history of nicotine dependence: Secondary | ICD-10-CM | POA: Diagnosis not present

## 2022-07-15 HISTORY — PX: LEFT HEART CATH AND CORS/GRAFTS ANGIOGRAPHY: CATH118250

## 2022-07-15 SURGERY — LEFT HEART CATH AND CORS/GRAFTS ANGIOGRAPHY
Anesthesia: LOCAL

## 2022-07-15 MED ORDER — ONDANSETRON HCL 4 MG/2ML IJ SOLN: 4.0000 mg | Freq: Four times a day (QID) | INTRAMUSCULAR | Status: DC | PRN

## 2022-07-15 MED ORDER — SODIUM CHLORIDE 0.9% FLUSH: 3.0000 mL | INTRAVENOUS | Status: DC | PRN

## 2022-07-15 MED ORDER — ACETAMINOPHEN 325 MG PO TABS: 650.0000 mg | ORAL_TABLET | ORAL | Status: DC | PRN

## 2022-07-15 MED ORDER — HEPARIN SODIUM (PORCINE) 1000 UNIT/ML IJ SOLN
INTRAMUSCULAR | Status: AC
Start: 2022-07-15 — End: ?
  Filled 2022-07-15: qty 10

## 2022-07-15 MED ORDER — SODIUM CHLORIDE 0.9 % IV SOLN: 250.0000 mL | INTRAVENOUS | Status: DC | PRN

## 2022-07-15 MED ORDER — LIDOCAINE HCL (PF) 1 % IJ SOLN
INTRAMUSCULAR | Status: AC
  Filled 2022-07-15: qty 30

## 2022-07-15 MED ORDER — SODIUM CHLORIDE 0.9 % WEIGHT BASED INFUSION: 1.0000 mL/kg/h | INTRAVENOUS | Status: AC

## 2022-07-15 MED ORDER — MIDAZOLAM HCL 2 MG/2ML IJ SOLN
INTRAMUSCULAR | Status: AC
  Filled 2022-07-15: qty 2

## 2022-07-15 MED ORDER — FENTANYL CITRATE (PF) 100 MCG/2ML IJ SOLN
INTRAMUSCULAR | Status: AC
  Filled 2022-07-15: qty 2

## 2022-07-15 MED ORDER — IOHEXOL 350 MG/ML SOLN
INTRAVENOUS | Status: DC | PRN
  Administered 2022-07-15: 90 mL

## 2022-07-15 MED ORDER — SODIUM CHLORIDE 0.9 % WEIGHT BASED INFUSION
3.0000 mL/kg/h | INTRAVENOUS | Status: AC
  Administered 2022-07-15: 3 mL/kg/h via INTRAVENOUS

## 2022-07-15 MED ORDER — HEPARIN (PORCINE) IN NACL 1000-0.9 UT/500ML-% IV SOLN
INTRAVENOUS | Status: DC | PRN
  Administered 2022-07-15 (×2): 500 mL

## 2022-07-15 MED ORDER — SODIUM CHLORIDE 0.9% FLUSH: 3.0000 mL | Freq: Two times a day (BID) | INTRAVENOUS | Status: DC

## 2022-07-15 MED ORDER — SODIUM CHLORIDE 0.9 % WEIGHT BASED INFUSION
1.0000 mL/kg/h | INTRAVENOUS | Status: DC
  Administered 2022-07-15: 1 mL/kg/h via INTRAVENOUS

## 2022-07-15 MED ORDER — ASPIRIN 81 MG PO CHEW: 81.0000 mg | CHEWABLE_TABLET | ORAL | Status: DC

## 2022-07-15 MED ORDER — FENTANYL CITRATE (PF) 100 MCG/2ML IJ SOLN
INTRAMUSCULAR | Status: DC | PRN
  Administered 2022-07-15: 25 ug via INTRAVENOUS

## 2022-07-15 MED ORDER — LIDOCAINE HCL (PF) 1 % IJ SOLN
INTRAMUSCULAR | Status: DC | PRN
  Administered 2022-07-15: 10 mL

## 2022-07-15 MED ORDER — VERAPAMIL HCL 2.5 MG/ML IV SOLN
INTRAVENOUS | Status: AC
  Filled 2022-07-15: qty 2

## 2022-07-15 MED ORDER — HEPARIN (PORCINE) IN NACL 1000-0.9 UT/500ML-% IV SOLN
INTRAVENOUS | Status: AC
  Filled 2022-07-15: qty 1000

## 2022-07-15 MED ORDER — MIDAZOLAM HCL 2 MG/2ML IJ SOLN
INTRAMUSCULAR | Status: DC | PRN
  Administered 2022-07-15: 1 mg via INTRAVENOUS
  Administered 2022-07-15: 2 mg via INTRAVENOUS

## 2022-07-15 SURGICAL SUPPLY — 15 items
CATH EXPO 5F MPA-1 (CATHETERS) ×1 IMPLANT
CATH INFINITI 5 FR IM (CATHETERS) ×1 IMPLANT
CATH INFINITI 5FR MULTPACK ANG (CATHETERS) ×1 IMPLANT
GLIDESHEATH SLEND SS 6F .021 (SHEATH) ×1 IMPLANT
GUIDEWIRE INQWIRE 1.5J.035X260 (WIRE) IMPLANT
INQWIRE 1.5J .035X260CM (WIRE) ×2
KIT HEART LEFT (KITS) ×2 IMPLANT
KIT MICROPUNCTURE NIT STIFF (SHEATH) ×1 IMPLANT
PACK CARDIAC CATHETERIZATION (CUSTOM PROCEDURE TRAY) ×2 IMPLANT
SHEATH PINNACLE 5F 10CM (SHEATH) ×1 IMPLANT
SHEATH PROBE COVER 6X72 (BAG) ×1 IMPLANT
TRANSDUCER W/STOPCOCK (MISCELLANEOUS) ×2 IMPLANT
TUBING CIL FLEX 10 FLL-RA (TUBING) ×2 IMPLANT
WIRE EMERALD 3MM-J .035X150CM (WIRE) ×1 IMPLANT
WIRE EMERALD 3MM-J .035X260CM (WIRE) ×1 IMPLANT

## 2022-07-15 NOTE — Interval H&P Note (Signed)
History and Physical Interval Note:  07/15/2022 12:17 PM  Megan [name]  has presented today for surgery, with the diagnosis of CHEST PAIN.  The various methods of treatment have been discussed with the patient and family. After consideration of risks, benefits and other options for treatment, the patient has consented to  Procedure(s): LEFT HEART CATH AND CORS/GRAFTS ANGIOGRAPHY (N/A) as a surgical intervention.  The patient's history has been reviewed, patient examined, no change in status, stable for surgery.  I have reviewed the patient's chart and labs.  Questions were answered to the patient's satisfaction.   Cath Lab Visit (complete for each Cath Lab visit)  Clinical Evaluation Leading to the Procedure:   ACS: Yes.    Non-ACS:    Anginal Classification: CCS III  Anti-ischemic medical therapy: Minimal Therapy (1 class of medications)  Non-Invasive Test Results: No non-invasive testing performed  Prior CABG: Previous CABG        Collier Salina [facility] 07/15/2022 12:17 PM

## 2022-07-15 NOTE — Progress Notes (Signed)
Femoral Sheath Pull  59F sheath pulled from RT Femoral artery at 1330.  Manual pressure held for 20 min.  VVS throughout. No bleeding. Gauze dressing, clean dry and intact.   Kathleene Hazel RN

## 2022-07-16 ENCOUNTER — Telehealth: Payer: Self-pay | Admitting: Cardiology

## 2022-07-16 ENCOUNTER — Encounter (HOSPITAL_COMMUNITY): Payer: Self-pay | Admitting: Cardiology

## 2022-07-16 DIAGNOSIS — R0602 Shortness of breath: Secondary | ICD-10-CM

## 2022-07-16 NOTE — Telephone Encounter (Signed)
Patient is calling wanting Dr. Doug Sou recommendation on what Pulmonologist he recommends she see per the hospital advising her to establish with one. She is also requesting a callback from [place] regarding this to discuss if a hospital  follow up with her PCP is necessary. Please advise.

## 2022-07-16 NOTE — Telephone Encounter (Signed)
Spoke to patient she stated she was told at discharge to see pulmonary.Advised I will place order someone from that office will call with appointment.

## 2022-07-26 NOTE — Progress Notes (Signed)
Cardiology Office Note   Date:  07/30/2022   ID:  Mauda, Markin [birth year], MRN [id number]  PCP:  [name], MD  Cardiologist: Massie Mees Martinique MD  No chief complaint on file.       History of Present Illness: Megan [name] is a 76 y.o. female who is seen for follow up CAD.  She has a history  of CAD, HTN, HL, and PAD.   She is status post coronary artery bypass graft surgery in 1995- Dr Servando Snare. She had a renal artery stent for renal artery stenosis in 2008. She had a stent graft of an abdominal aortic aneurysm 2009. She is s/p stenting of the left iliac. She is followed by [name] for PAD. Last CT in May 2018. She had [place] in June 2019 showing no endoleak.    She was hospitalized in July 2014 and had cardiac catheterization which showed that her grafts were open. EF normal.  She was hospitalized 11/15/2014 with which he describes as an aching sensation in her chest.    She underwent a treadmill Myoview on 11/17/14 which showed no ischemia and her ejection fraction was 86%.   In June 2019 she had Abdominal [facility] at [facility] showing AAA of 4 cm.   In October 2019 she underwent right rotator cuff repair. No complications.   She was  having progressive issues with her low back that limited her activity and caused pain. She has exhausted conservative measures and underwent lumbar fusion in July by Dr Ronnald Ramp.   She was in the hospital  1/31 and discharged on 2/8- for AKI, diarrhea- diverticulitis (sigmoid colon), metabolic encephalopathy, UTI. Echo at that time was OK with normal EF and moderate TR.   She was re- admitted 2/26-02/03/21 with NSTEMI. Troponin up to 1085. Ecg showed significant inferolateral TWA. She underwent cardiac cath demonstrating patent LIMA to the LAD. The native RCA was occluded and she had severe sequential lesions in the SVG to RCA. This was treated with overlapping DES x 2- 3.0 x 26 and 3.5 x 34 mm Onyx stents.  Procedure was complicated by a left forearm  hematoma. She was treated with DAPT with ASA and Brilinta. On metoprolol and high dose statin.   She was admitted for overnight observation on 02/11/21 after presenting with a localized ache in her left chest. This was different than when she presented with her NSTEMI. Ecg showed no new changes and troponin was negative x 3  On her last visit she complained of increased SOB- this was felt to be related to [place]. We switched to Plavix but she had an allergic reaction with rash and hives. Later switched to Prasugrel- reduced dose based on age and female.    She presented to the ED on 7/21 with complaints of chest pain over the past 2 days. Developed an aching sensation in the left side of her chest on Tuesday afternoon. Tried a SL nitro without relief. Also tried several reflux medications without much either. Symptoms lingered into the night and next morning she woke up with ongoing symptoms.  She underwent repeat cardiac cath demonstrating restenosis of the SVG to RCA with clear evidence of prior stent fracture. She underwent repeat PCI with 3.5 x 26 mm Onyx stent post dilated to 4.0 with Mullens balloon using IVUS guidance. Of note there were no collaterals to the distal RCA. There was significant aortic calcification. On July 28 she complained of some recurrent chest pain and her Imdur  was increased. When seen in office on August 17 she was better. A sleep study ordered due to daytime somnolence.   She was seen in the ED on 08/27/21 with recurrent chest pain. Ecg showed no changes and troponins were negative. Reports pain the same as before. In discussion with cardiology fellow she was DC from ED with increased dose of Imdur to 90 mg daily and metoprolol to 37.5 mg bid. She was concerned so she underwent repeat cardiac cath which showed the stent was patent.   Was seen recently by Laurann Montana PA on Jully 25.  Noted symptoms of dyspnea. was seen by her PCP. Noted her BP was a little high and Losartan dose  was increased to 16m daily. The next day started having cold chills and shaking. Felt she was coming down with a cold. Noted body aches, temperature was 961F which is much higher than her baseline. Cough with sputum production. COVID tests x3 were negative. Later developed  sporadic shortness of breath associated with chest aching. This was similar to prior anginal symptoms but not as bad. Labs were done and were normal. Ecg no change. Imdur was increased to 90 mg daily. Sometimes will take an extra dose in the evening.  She continued to have symptoms of atypical chest pain and some dyspnea so we proceeded with repeat cardiac cath. This showed patent grafts and no new disease.    On follow up today she still complains of SOB and aching in her heart. No problems with cath. Is scheduled to see pulmonary soon.     Past Medical History:  Diagnosis Date   AAA [place]abdominal aortic aneurysm) ([place]    a. s/p stent grafting in 2009.   Adenomatous colon polyp 05/2001   Adenomatous duodenal polyp    Allergy    Anginal pain (HCC)    COPD [place]chronic obstructive pulmonary disease) ([place]    pt denies COPD   Coronary artery disease    a. s/p CABG in 1995;  b. 05/2013 Neg MV, EF 82%;  c. 06/2013 Cath: LM nl, LAD 40-50p, LCX nl, RCA 80p/1027mVG->RCA->PDA->PLSA min irregs, LIMA->LAD atretic, vigorous LV fxn.   Diabetes mellitus without complication (HCC)    history of, resolved. 2017   Diverticulosis    Eczema, dyshidrotic    Eczema, dyshidrotic 1986   Dr. HuMathis Fare [facility]   Fatty liver    Fibromyalgia    GERD (gastroesophageal reflux disease)    Hyperlipidemia    Hypertension    Migraine    [facility]   Osteoarthritis    Osteoporosis    unsure, having a bone scan soon   Peripheral arterial disease ([facility]   left leg diagnosed by Dr BrMare Ferrari [facility] ([facility]   a. 2008 s/p PTA    Past Surgical History:  Procedure Laterality Date   ABDOMINAL  AORTAGRAM N/A 03/06/2014   Procedure: ABDOMINAL AORTAGRAM;  Surgeon: ToRosetta PosnerMD;  [facility];  Service: Cardiovascular;  Laterality: N/A;   ABDOMINAL AORTIC ANEURYSM REPAIR  2009   stenting   ABDOMINAL HYSTERECTOMY     Total, 1992   BREAST BIOPSY     CARDIAC CATHETERIZATION     CARDIOVASCULAR STRESS TEST  11/11/2009   normal study   CARPAL TUNNEL RELEASE  08/1993   left wrist   CARPAL TUNNEL RELEASE  01/1997   right   CATARACT EXTRACTION, BILATERAL  2021  CORONARY ARTERY BYPASS GRAFT  07/1994   Triple by Dr Ceasar Mons   CORONARY STENT INTERVENTION N/A 02/02/2021   Procedure: CORONARY STENT INTERVENTION;  Surgeon: Nelva Bush, MD;  Location: [place] CV LAB;  Service: Cardiovascular;  Laterality: N/A;   CORONARY STENT INTERVENTION N/A 06/26/2021   Procedure: CORONARY STENT INTERVENTION;  Surgeon: Sherren Mocha, MD;  Location: [place] CV LAB;  Service: Cardiovascular;  Laterality: N/A;   dental implants     EYE SURGERY  03/2020   cataract   HAND SURGERY Right 09/2002   Right hand pulley release   INTRAVASCULAR ULTRASOUND/IVUS N/A 06/26/2021   Procedure: Intravascular Ultrasound/IVUS;  Surgeon: Sherren Mocha, MD;  Location: [place] CV LAB;  Service: Cardiovascular;  Laterality: N/A;   INTRAVASCULAR ULTRASOUND/IVUS N/A 09/03/2021   Procedure: Intravascular Ultrasound/IVUS;  Surgeon: Early Osmond, MD;  Location: [place] CV LAB;  Service: Cardiovascular;  Laterality: N/A;   LAMINECTOMY WITH POSTERIOR LATERAL ARTHRODESIS LEVEL 1 N/A 03/24/2022   Procedure: LAMINECTOMY, FORAMINOTOMY, LUMBAR TWO- THREE ; INTERTRANSVERSE FUSION LUMBAR TWO - LUMBAR THREE RIGHT;  Surgeon: Eustace Moore, MD;  Location: [place];  Service: Neurosurgery;  Laterality: N/A;   LEFT HEART CATH AND CORONARY ANGIOGRAPHY N/A 06/26/2021   Procedure: LEFT HEART CATH AND CORONARY ANGIOGRAPHY;  Surgeon: Sherren Mocha, MD;  Location: [place] CV LAB;  Service: Cardiovascular;  Laterality:  N/A;   LEFT HEART CATH AND CORS/GRAFTS ANGIOGRAPHY N/A 02/02/2021   Procedure: LEFT HEART CATH AND CORS/GRAFTS ANGIOGRAPHY;  Surgeon: Nelva Bush, MD;  Location: [place] CV LAB;  Service: Cardiovascular;  Laterality: N/A;   LEFT HEART CATH AND CORS/GRAFTS ANGIOGRAPHY N/A 09/03/2021   Procedure: LEFT HEART CATH AND CORS/GRAFTS ANGIOGRAPHY;  Surgeon: Early Osmond, MD;  Location: [place] CV LAB;  Service: Cardiovascular;  Laterality: N/A;   LEFT HEART CATH AND CORS/GRAFTS ANGIOGRAPHY N/A 07/15/2022   Procedure: LEFT HEART CATH AND CORS/GRAFTS ANGIOGRAPHY;  Surgeon: Martinique, Emmett Arntz M, MD;  Location: [place] CV LAB;  Service: Cardiovascular;  Laterality: N/A;   LEFT HEART CATHETERIZATION WITH CORONARY/GRAFT ANGIOGRAM N/A 06/11/2013   Procedure: LEFT HEART CATHETERIZATION WITH [name];  Surgeon: Thayer Headings, MD;  Location[facility] CATH LAB;  Service: Cardiovascular;  Laterality: N/A;   MULTIPLE TOOTH EXTRACTIONS  2000   All upper teeth.  Has full dneture.    RENAL ARTERY STENT  2008   SHOULDER ARTHROSCOPY WITH ROTATOR CUFF REPAIR Right 09/27/2018   Procedure: Right shoulder mini open rotator cuff repair;  Surgeon: Susa Day, MD;  Location: [facility];  Service: Orthopedics;  Laterality: Right;  90 mins   thyroid cyst apiration  05/1997 and 07/1998   TONSILECTOMY, ADENOIDECTOMY, BILATERAL MYRINGOTOMY AND TUBES  1989   TONSILLECTOMY     TOTAL ABDOMINAL HYSTERECTOMY  1992     Current Outpatient Medications  Medication Sig Dispense Refill   Accu-Chek FastClix Lancets MISC Check blood sugar once daily PRN 100 each 1   albuterol (VENTOLIN HFA) 108 (90 Base) MCG/ACT inhaler Inhale 2 puffs into the lungs every 6 (six) hours as needed for wheezing or shortness of breath. 8 g 2   ALPRAZolam (XANAX) 0.25 MG tablet Take 1 tablet (0.25 mg total) by mouth at bedtime as needed for sleep. (Patient taking differently: Take 0.25 mg by mouth daily as needed for sleep or anxiety.) 90  tablet 3   aspirin EC 81 MG tablet Take 1 tablet (81 mg total) by mouth daily. 90 tablet 3   atorvastatin (LIPITOR) 80 MG  tablet TAKE 1 TABLET BY MOUTH EVERY DAY (Patient taking differently: Take 80 mg by mouth every evening.) 90 tablet 3   B Complex-Biotin-FA (B-100 COMPLEX PO) Take 1 tablet by mouth every evening.     bismuth subsalicylate (PEPTO BISMOL) 262 MG/15ML suspension Take 30 mLs by mouth every 6 (six) hours as needed for indigestion or diarrhea or loose stools.     Budeson-Glycopyrrol-Formoterol (BREZTRI AEROSPHERE) 160-9-4.8 MCG/ACT AERO Inhale 2 puffs into the lungs in the morning and at bedtime.     cetirizine (ZYRTEC) 10 MG tablet Take 10 mg by mouth daily as needed for allergies.     cholecalciferol (VITAMIN D3) 25 MCG (1000 UNIT) tablet Take 1,000 Units by mouth every evening.     CORDRAN 4 MCG/SQCM TAPE Apply 1 each topically daily as needed (Rash).      cycloSPORINE (RESTASIS) 0.05 % ophthalmic emulsion Place 1 drop into both eyes 2 (two) times daily.      ezetimibe (ZETIA) 10 MG tablet Take 10 mg by mouth every evening.     gabapentin (NEURONTIN) 300 MG capsule Take 1 capsule (300 mg total) by mouth 3 (three) times daily. (Patient taking differently: Take 300 mg by mouth 2 (two) times daily.) 270 capsule 3   glucose blood (ACCU-CHEK SMARTVIEW) test strip CHECK BLOOD SUGAR ONCE DAILY AS NEEDED 100 strip 1   hydrochlorothiazide (MICROZIDE) 12.5 MG capsule Take 12.5 mg by mouth daily.     isosorbide mononitrate (IMDUR) 30 MG 24 hr tablet Take 3 tablets ( 90 mg ) every morning and 1 tablet (30 mg ) every evening (Patient taking differently: Take 30-90 mg by mouth See admin instructions. Take 3 tablets ( 90 mg ) every morning and 1 or 2 tablets (30 - 60 mg ) every evening) 360 tablet 3   losartan (COZAAR) 100 MG tablet Take 1 tablet (100 mg total) by mouth daily. 90 tablet 3   metoprolol succinate (TOPROL XL) 25 MG 24 hr tablet Take 1 tablet (25 mg total) by mouth daily. 90 tablet 3    Multiple Vitamins-Minerals (MULTIVITAMIN WITH MINERALS) tablet Take 1 tablet by mouth every evening.     omeprazole (PRILOSEC) 40 MG capsule Take 1 capsule (40 mg total) by mouth in the morning and at bedtime. 180 capsule 3   oxyCODONE-acetaminophen (PERCOCET) 10-325 MG tablet Take 1 tablet by mouth in the morning and at bedtime. Can take one tablet three times a day as needed for back pain     polyethylene glycol (MIRALAX / GLYCOLAX) 17 g packet Take 17 g by mouth daily as needed for moderate constipation.     prasugrel (EFFIENT) 5 MG TABS tablet TAKE 1 TABLET (5 MG TOTAL) BY MOUTH DAILY. 90 tablet 3   PRESCRIPTION MEDICATION Place 2 puffs into both ears daily as needed (ear infections). [facility] ear powder     Simethicone (GAS-X PO) Take 1-2 tablets by mouth daily as needed (gas).     tretinoin (RETIN-A) 0.1 % cream Apply 1 application. topically every evening.     nitroGLYCERIN (NITROSTAT) 0.4 MG SL tablet Place 1 tablet (0.4 mg total) under the tongue every 5 (five) minutes as needed for chest pain. 25 tablet 11   No current facility-administered medications for this visit.    Allergies:   Brilinta [ticagrelor], Bextra [valdecoxib], Hydrocod poli-chlorphe poli er, Lipitor [atorvastatin], Mevacor [lovastatin], Plavix [clopidogrel bisulfate], Zocor [simvastatin], Doxycycline, Metoprolol, Morphine and related, Codeine, and Lisinopril    Social History:  The patient  reports that  she quit smoking about 26 years ago. Her smoking use included cigarettes. She has never used smokeless tobacco. She reports current alcohol use of about 6.0 - 10.0 standard drinks of alcohol per week. She reports that she does not use drugs.   Family History:  The patient's family history includes AAA (abdominal aortic aneurysm) in her brother; Breast cancer in her sister; Colon cancer in her cousin and paternal grandmother; Diabetes in her brother and sister; Heart attack in her brother, father, mother, and sister; Heart  disease in her brother, brother, father, mother, and sister; Hyperlipidemia in her brother and sister; Hypertension in her brother and sister; Liver cancer in her sister.    ROS:  Please see the history of present illness.   Otherwise, review of systems are positive for none.   All other systems are reviewed and negative.    PHYSICAL EXAM: VS:  BP (!) 112/56   Pulse 75   Ht 5' (1.524 m)   Wt 127 lb 3.2 oz (57.7 kg)   SpO2 96%   BMI 24.84 kg/m  , BMI Body mass index is 24.84 kg/m. GENERAL:  Overweight  WF in NAD HEENT:  PERRL, EOMI, sclera are clear. Oropharynx is clear. NECK:  No jugular venous distention, carotid upstroke brisk and symmetric, no bruits, no thyromegaly or adenopathy LUNGS:  Clear to auscultation bilaterally CHEST:  Unremarkable HEART:  RRR,  PMI not displaced or sustained,S1 and S2 within normal limits, no S3, no S4: no clicks, no rubs, no murmurs ABD:  Soft, nontender. BS +, no masses or bruits. No hepatomegaly, no splenomegaly EXT:  1 + pulses throughout, no edema, no cyanosis no clubbing SKIN:  Warm and dry.  No rashes NEURO:  Alert and oriented x 3. Cranial nerves II through XII intact. PSYCH:  Cognitively intact     EKG:  EKG is not done today.    Recent Labs: 11/09/2021: TSH 1.62 04/05/2022: ALT 16 06/29/2022: BNP 94.2; Magnesium 1.9 07/11/2022: BUN 10; Creatinine, Ser 0.74; Hemoglobin 13.8; Platelets 221; Potassium 3.6; Sodium 140   Labs from primary care 12/15/16: A1c 5.6%. : Cholesterol 167 Trig-209, HDL 52, LDL 73.  AST 51, ALT 65. Normal renal indices. TSH is normal. Dated 06/24/17: CRP <0.3. Glucose 120, AST 58, ALT 80, other chemistries normal.  Dated 09/28/18: normal CBC, CMET  Lipid Panel    Component Value Date/Time   CHOL 142 04/05/2022 1005   TRIG 209 (H) 04/05/2022 1005   HDL 51 04/05/2022 1005   CHOLHDL 2.8 04/05/2022 1005   CHOLHDL 2.9 06/25/2021 0408   VLDL 20 06/25/2021 0408   LDLCALC 57 04/05/2022 1005   LDLDIRECT 82.0 06/13/2020  1017      Wt Readings from Last 3 Encounters:  07/30/22 127 lb 3.2 oz (57.7 kg)  07/15/22 126 lb (57.2 kg)  07/07/22 128 lb 6.4 oz (58.2 kg)    Cardiac cath / PCI 02/02/21: CORONARY STENT INTERVENTION  LEFT HEART CATH AND CORS/GRAFTS ANGIOGRAPHY    Conclusion  Conclusions: Severe single-vessel coronary artery disease with chronic total occlusion of mid RCA. Mild to moderate, diffuse LAD disease with competitive flow in the distal vessel from LIMA-LAD. Small but patent LIMA-LAD. Patent SVG-rPDA-rPL with diffuse ostial/proximal disease of up to 60% and tandem hazy 95% and 90% mid graft lesions. Successful PCI to ostial/proximal and mid SVG-rPDA-rPL using non-overlapping Resolute Onyx 3.0 x 26 mm and 3.5 x 34 mm drug-eluting stents with 0% residual stenosis and TIMI-3 flow. Left forearm hematoma following sheath removal  ant TR band placement, controlled with manual compression and placement of second TR band proximal to the first band.   Recommendations: Dual antiplatelet therapy with aspirin and ticagrelor for at least 12 months. Aggressive secondary prevention. Close monitoring of left forearm hematoma.   Nelva Bush, MD [facility]    Coronary Diagrams   Diagnostic Dominance: Right    Intervention     Implants       Echo 01/31/21: IMPRESSIONS     1. Left ventricular ejection fraction, by estimation, is 60 to 65%. The  left ventricle has normal function. The left ventricle has no regional  wall motion abnormalities. Left ventricular diastolic parameters are  consistent with Grade I diastolic  dysfunction (impaired relaxation).   2. Right ventricular systolic function is normal. The right ventricular  size is normal. There is normal pulmonary artery systolic pressure.   3. Left atrial size was severely dilated.   4. The mitral valve is grossly normal. Mild mitral valve regurgitation:  Apperance of two jets; study may underestimated regurgitation  severity.   5. The aortic valve is tricuspid. Aortic valve regurgitation is not  visualized. No aortic stenosis is present.   6. The inferior vena cava is normal in size with greater than 50%  respiratory variability, suggesting right atrial pressure of 3 mmHg.   Comparison(s): A prior study was performed on 01/09/21. No significant  change from prior study. Prior images reviewed side by side. Similar to  prior.    Cardiac cath 06/26/21: Procedures  CORONARY STENT INTERVENTION  Intravascular Ultrasound/IVUS  LEFT HEART CATH AND CORONARY ANGIOGRAPHY   Conclusion      Dist LM lesion is 15% stenosed.   Prox RCA lesion is 75% stenosed.   Mid RCA to Dist RCA lesion is 100% stenosed with 100% stenosed side branch in RPAV.   Origin to Prox Graft lesion before RPDA  is 80% stenosed.   Non-stenotic Mid Graft-1 lesion before RPDA was previously treated.   Non-stenotic Mid Graft-2 lesion before RPDA was previously treated.   A drug-eluting stent was successfully placed using a STENT ONYX FRONTIER 3.5X26.   Post intervention, there is a 20% residual stenosis.   Seq SVG- and is large.   LIMA and is small.   There is competitive flow.   1.  Stable native vessel coronary artery disease with chronic total occlusion of the proximal to mid RCA, patency of the left main, diffuse mild to moderate calcific nonobstructive LAD stenosis, and moderate nonobstructive proximal left circumflex stenosis 2.  Continued patency of the LIMA to LAD 3.  Continued patency of the sequential saphenous vein graft to PDA and posterolateral branches with severe in-stent restenosis in the proximal stent, both lesions likely related to stent fracture 4.  Successful intravascular ultrasound-guided PCI of the saphenous vein graft to RCA using a 3.5 x 26 mm resolute Onyx DES postdilated to high pressure with a 4.0 mm noncompliant balloon.  Diagnostic Dominance: Right Intervention    Implants    Cardiac cath 09/03/21:   Early Osmond, MD (Primary)    Martinique, Coner Gibbard M, MD (Assisting)     Procedures  Intravascular Ultrasound/IVUS  LEFT HEART CATH AND CORS/GRAFTS ANGIOGRAPHY   Conclusion      Dist LM lesion is 15% stenosed.   Prox RCA lesion is 75% stenosed.   Mid RCA to Dist RCA lesion is 100% stenosed with 100% stenosed side branch in RPAV.   Origin to Prox Graft lesion before RPDA  is 20%  stenosed.   Non-stenotic Mid Graft-1 lesion before RPDA was previously treated.   Non-stenotic Mid Graft-2 lesion before RPDA was previously treated.   Seq SVG- and is large.   LIMA and is small.   There is competitive flow.   LV end diastolic pressure is normal.    Patent vein graft to PDA with minimal constraint; IVUS interrogation demonstrated a minimal stent area of 5.32m2. Patent LIMA to LAD with relatively unchanged burden of disease elsewhere. Normal LVEDP   Recommendations:  Medical management.   Cardiac cath 07/15/22:  LEFT HEART CATH AND CORS/GRAFTS ANGIOGRAPHY   Conclusion      Dist LM lesion is 15% stenosed.   Prox RCA lesion is 75% stenosed.   Mid RCA to Dist RCA lesion is 100% stenosed with 100% stenosed side branch in RPAV.   Origin to Prox Graft lesion before RPDA  is 30% stenosed.   Prox Graft to Mid Graft lesion before RPDA  is 20% stenosed.   Non-stenotic Mid Graft-1 lesion before RPDA was previously treated.   Non-stenotic Mid Graft-2 lesion before RPDA was previously treated.   Seq SVG- and is large.   LIMA and is small.   There is competitive flow.   The left ventricular systolic function is normal.   LV end diastolic pressure is normal.   The left ventricular ejection fraction is greater than 65% by visual estimate.   2 vessel obstructive CAD Patent LIMA to the LAD Patent SVG to RCA. Mild nonobstructive disease Normal LV function Normal LVEDP   Plan: continue medical therapy.  Coronary Diagrams  Diagnostic Dominance: Right  ASSESSMENT AND PLAN:  1. CAD status  post CABG in 1995.  s/p NSTEMI in Feb. Cardiac cath demonstrated patent LIMA to the LAD. Occluded native RCA. SVG to PDA/PL had severe sequential stenoses treated with DES x 2. Normal EF.  Developed dyspnea on Brilinta. Allergic reaction to Plavix. Now on Prasugrel lower dose due to age and female sex. Had recurrent angina in July 2022. Restenosis in the proximal stent in the SVG to RCA with some associated stent fracture. Had repeat stenting with IVUS guidance. Recent cardiac cath showed stable disease with only 30% in stent stenosis in SVG to RCA. Normal LV function and normal LVEDP  2. HTN with hypertensive heart disease. BP is under  good control.   3. Hypercholesterolemia- LDL 57 on statin and Zetia.   4.  Status post stent to right renal artery 2008. Patent by CT in October  5. Status post stent graft to abdominal aortic aneurysm 2009.  S/p iliac stent in 2016. Stable by CT in October  6. Peripheral artery disease followed by Dr.Cain  7. Lumbar spine disease. S/p fusion with persistent pain. CT planned with Dr JRonnald Ramp   8. Persistent SOB- no clear cardiac etiology. Will see what pulmonary has to say  Dispostion: follow up in 6 months.   Signed, Maelys Kinnick JMartiniqueMD, FThe [facility] Of [facility]   07/30/2022 4:45 PM    [place]

## 2022-07-27 ENCOUNTER — Other Ambulatory Visit: Payer: Self-pay | Admitting: General Practice

## 2022-07-28 ENCOUNTER — Other Ambulatory Visit: Payer: Self-pay

## 2022-07-28 NOTE — Patient Instructions (Signed)
Visit Information  Thank you for taking time to visit with me today. Please don't hesitate to contact me if I can be of assistance to you.   Following are the goals we discussed today:   Goals Addressed             This Visit's Progress    COMPLETED: Care Coordination Activities - no follow up required       Care Coordination Interventions: Provided education to patient re: Annual Wellness Visit, care coordination services Assessed social determinant of health barriers Declines to schedule Annual Wellness Visit as recently had physical.  No further interventions needed         If you are experiencing a Mental Health or [place] or need someone to talk to, please call the Suicide and Crisis Lifeline: 988 call the [place] National Suicide Prevention Lifeline: 3308548954 or TTY: 270-702-1248 TTY 380-845-3956) to talk to a trained counselor call 1-800-273-TALK (toll free, 24 hour hotline) go to [facility] [address] [place] [phone]) call 911   Patient verbalizes understanding of instructions and care plan provided today and agrees to view in [place]. Active MyChart status and patient understanding of how to access instructions and care plan via MyChart confirmed with patient.     No further follow up required:    Peter Garter RN, Jackquline [place], [facility] [phone]

## 2022-07-28 NOTE — Patient Outreach (Signed)
  Care Coordination   07/28/2022 Name: Megan [name] MRN: [id number] DOB: 05/04/1946   Care Coordination Outreach Attempts:  An unsuccessful telephone outreach was attempted today to offer the patient information about available care coordination services as a benefit of their health plan.   Follow Up Plan:  Additional outreach attempts will be made to offer the patient care coordination information and services.   Encounter Outcome:  No Answer  Care Coordination Interventions Activated:  No   Care Coordination Interventions:  No, not indicated    Peter Garter RN, BSN,CCM, [facility] [phone]

## 2022-07-28 NOTE — Patient Outreach (Signed)
  Care Coordination   Initial Visit Note   07/28/2022 Name: Megan [name] MRN: [id number] DOB: Jul 30, 1946  Megan [name] is a 76 y.o. year old female who sees [name], MD for primary care. I spoke with  Megan [name] by phone today  What matters to the patients health and wellness today?  I am doing much better.  I walk 1-2 miles a day     Goals Addressed             This Visit's Progress    COMPLETED: Care Coordination Activities - no follow up required       Care Coordination Interventions: Provided education to patient re: Annual Wellness Visit, care coordination services Assessed social determinant of health barriers Declines to schedule Annual Wellness Visit as recently had physical.  No further interventions needed         SDOH assessments and interventions completed:  Yes  SDOH Interventions Today    Flowsheet Row Most Recent Value  SDOH Interventions   Food Insecurity Interventions Intervention Not Indicated  Housing Interventions Intervention Not Indicated  Transportation Interventions Intervention Not Indicated        Care Coordination Interventions Activated:  Yes  Care Coordination Interventions:  Yes, provided   Follow up plan: No further intervention required.   Encounter Outcome:  Pt. Visit Completed  Peter Garter RN, BSN,CCM, CDE Care Management Coordinator [facility] [phone]

## 2022-07-30 ENCOUNTER — Encounter: Payer: Self-pay | Admitting: Cardiology

## 2022-07-30 ENCOUNTER — Ambulatory Visit: Payer: Medicare Other | Admitting: Cardiology

## 2022-07-30 VITALS — BP 112/56 | HR 75 | Ht 60.0 in | Wt 127.2 lb

## 2022-07-30 DIAGNOSIS — I209 Angina pectoris, unspecified: Secondary | ICD-10-CM

## 2022-07-30 DIAGNOSIS — R0602 Shortness of breath: Secondary | ICD-10-CM | POA: Diagnosis not present

## 2022-07-30 MED ORDER — NITROGLYCERIN 0.4 MG SL SUBL
0.4000 mg | SUBLINGUAL_TABLET | SUBLINGUAL | 11 refills | Status: DC | PRN
Start: 2022-07-30 — End: 2024-07-31

## 2022-08-04 ENCOUNTER — Other Ambulatory Visit: Payer: Self-pay | Admitting: General Practice

## 2022-08-13 ENCOUNTER — Ambulatory Visit: Payer: Medicare Other | Admitting: Pulmonary Disease

## 2022-08-13 ENCOUNTER — Encounter: Payer: Self-pay | Admitting: Pulmonary Disease

## 2022-08-13 VITALS — BP 124/62 | HR 71 | Temp 98.4°F | Ht 60.0 in | Wt 131.0 lb

## 2022-08-13 DIAGNOSIS — R0609 Other forms of dyspnea: Secondary | ICD-10-CM

## 2022-08-13 NOTE — Progress Notes (Signed)
$'@Patient'V$  ID: Megan [name], female    DOB: November 12, 1946, 76 y.o.   MRN: [id number]  Chief Complaint  Patient presents with   Consult    Pt is here for consult for SOB and aching heart. Pt states the this all started back in July. Pt is currently taking Breztri daily. Pt states that this medication des help    Referring provider: [place], Peter M, MD  HPI:   76 y.o. whom are seen in consultation for evaluation of dyspnea on exertion.  Most recent cardiology note reviewed.  She was in her usual state of health.  She developed onset of shortness of breath a few weeks ago.  Different than baseline dyspnea.  Recurrent shorter distances.  Preceded with aches, sounds like viral infection.  Still some heart ache as well which is concerning to her given her early CAD and significant coronary history.  This prompted presentation to the ED.  There CTA PE protocol was negative for PE but showed presevere emphysematous changes with predilection for the upper lobes otherwise clear on my review and interpretation.  She subsequent underwent a left heart catheterization showed stable coronary artery disease and prior PCI and CABG.  This was reassuring.  In the interim she was placed on Breztri.  She flexes helped some with her shortness of breath.  Able to do more.  Still get a little bit winded on longer walks etc.  She used to walk a mile and a half a day.  She is working back to getting to that distance.  PMH: Tobacco abuse in remission, hypertension, CAD, asthma, hyperlipidemia, seasonal allergies Surgical history: Back surgery, CABG, PCI Family history: Mother and father with CAD Social history: Former smoker, quit 1997, lives in [place] / Pulmonary Flowsheets:   ACT:      No data to display          MMRC:     No data to display          Epworth:      No data to display          Tests:   FENO:  No results found for: "NITRICOXIDE"  PFT:     No data to  display          WALK:      No data to display          Imaging: Personally reviewed and as per EMR discussion in this note CARDIAC CATHETERIZATION  Result Date: 07/15/2022   Dist LM lesion is 15% stenosed.   Prox RCA lesion is 75% stenosed.   Mid RCA to Dist RCA lesion is 100% stenosed with 100% stenosed side branch in RPAV.   Origin to Prox Graft lesion before RPDA  is 30% stenosed.   Prox Graft to Mid Graft lesion before RPDA  is 20% stenosed.   Non-stenotic Mid Graft-1 lesion before RPDA was previously treated.   Non-stenotic Mid Graft-2 lesion before RPDA was previously treated.   Seq SVG- and is large.   LIMA and is small.   There is competitive flow.   The left ventricular systolic function is normal.   LV end diastolic pressure is normal.   The left ventricular ejection fraction is greater than 65% by visual estimate. 2 vessel obstructive CAD Patent LIMA to the LAD Patent SVG to RCA. Mild nonobstructive disease Normal LV function Normal LVEDP Plan: continue medical therapy.    Lab Results: Personally reviewed CBC  Component Value Date/Time   WBC 9.8 07/11/2022 1912   RBC 4.68 07/11/2022 1912   HGB 13.8 07/11/2022 1912   HGB 15.2 06/29/2022 1538   HCT 40.7 07/11/2022 1912   HCT 45.3 06/29/2022 1538   PLT 221 07/11/2022 1912   PLT 272 06/29/2022 1538   MCV 87.0 07/11/2022 1912   MCV 89 06/29/2022 1538   MCH 29.5 07/11/2022 1912   MCHC 33.9 07/11/2022 1912   RDW 13.0 07/11/2022 1912   RDW 12.4 06/29/2022 1538   LYMPHSABS 2.0 01/06/2022 0954   MONOABS 0.5 03/16/2021 1347   EOSABS 0.2 01/06/2022 0954   BASOSABS 0.0 01/06/2022 0954    BMET    Component Value Date/Time   NA 140 07/11/2022 1912   NA 143 06/29/2022 1538   K 3.6 07/11/2022 1912   CL 108 07/11/2022 1912   CO2 24 07/11/2022 1912   GLUCOSE 109 (H) 07/11/2022 1912   BUN 10 07/11/2022 1912   BUN 7 (L) 06/29/2022 1538   CREATININE 0.74 07/11/2022 1912   CREATININE 0.71 06/29/2016 1057   CALCIUM 9.2  07/11/2022 1912   GFRNONAA >60 07/11/2022 1912   GFRAA 89 01/01/2020 0835    BNP    Component Value Date/Time   BNP 94.2 06/29/2022 1538   BNP 862.1 (H) 01/08/2021 1630    ProBNP No results found for: "PROBNP"  Specialty Problems       Pulmonary Problems   COPD with chronic bronchitis (HCC)    Allergies  Allergen Reactions   Brilinta [Ticagrelor] Shortness Of Breath   Bextra [Valdecoxib] Other (See Comments)    Increased lft's    Hydrocod Poli-Chlorphe Poli Er Rash   Lipitor [Atorvastatin] Other (See Comments)    Leg weakness    Mevacor [Lovastatin] Other (See Comments)    Muscle weakness    Plavix [Clopidogrel Bisulfate] Itching and Other (See Comments)    Hands and feet tingle and itch   Zocor [Simvastatin] Other (See Comments)    Bones hurt    Doxycycline Diarrhea and Nausea And Vomiting   Metoprolol Other (See Comments)    Hair loss   Morphine And Related Itching and Nausea Only   Codeine Itching and Rash    Hyper-active   Lisinopril Cough    Immunization History  Administered Date(s) Administered   Fluad Quad(high Dose 65+) 07/30/2019, 08/10/2022   Influenza, High Dose Seasonal PF 08/13/2017, 08/26/2018, 09/15/2020   Influenza, Seasonal, Injecte, Preservative Fre 09/21/2014, 09/06/2015, 07/21/2016   Influenza,inj,quad, With Preservative 08/06/2017   Influenza-Unspecified 08/10/2017, 10/13/2021   PFIZER(Purple Top)SARS-COV-2 Vaccination 12/22/2019, 01/12/2020, 09/15/2020   PPD Test 09/13/1979, 03/10/2021   Pfizer Covid-19 Vaccine Bivalent Booster 61yr & up 10/13/2021   Pneumococcal Conjugate-13 12/31/2016   Pneumococcal Polysaccharide-23 12/16/2014, 08/01/2019   Td 05/13/2008   Tdap 08/09/2012   Zoster Recombinat (Shingrix) 01/13/2018, 03/14/2018   Zoster, Live 11/03/2010    Past Medical History:  Diagnosis Date   AAA [place]abdominal aortic aneurysm) [place]    a. s/p stent grafting in 2009.   Adenomatous colon polyp 05/2001   Adenomatous duodenal  polyp    Allergy    Anginal pain (HCC)    COPD (chronic obstructive pulmonary disease) ([facility]    pt denies COPD   Coronary artery disease    a. s/p CABG in 1995;  b. 05/2013 Neg MV, EF 82%;  c. 06/2013 Cath: LM nl, LAD 40-50p, LCX nl, RCA 80p/1015mVG->RCA->PDA->PLSA min irregs, LIMA->LAD atretic, vigorous LV fxn.   Diabetes mellitus without complication ([facility]  history of, resolved. 2017   Diverticulosis    Eczema, dyshidrotic    Eczema, dyshidrotic 1986   Dr. Mathis Fare   Emphysema of lung [facility])    Fatty liver    Fibromyalgia    GERD (gastroesophageal reflux disease)    Hyperlipidemia    Hypertension    Migraine    Myocardial infarction [facility])    Osteoarthritis    Osteoporosis    unsure, having a bone scan soon   Peripheral arterial disease ([place])    left leg diagnosed by Dr Mare Ferrari   Renal artery stenosis [facility])    a. 2008 s/p PTA    Tobacco History: Social History   Tobacco Use  Smoking Status Former   Packs/day: 0.00   Years: 37.00   Total pack years: 0.00   Types: Cigarettes   Quit date: 12/07/1995   Years since quitting: 26.7  Smokeless Tobacco Never   Counseling given: Not Answered   Continue to not smoke  Outpatient Encounter Medications as of 08/13/2022  Medication Sig   Accu-Chek FastClix Lancets MISC Check blood sugar once daily PRN   albuterol (VENTOLIN HFA) 108 (90 Base) MCG/ACT inhaler Inhale 2 puffs into the lungs every 6 (six) hours as needed for wheezing or shortness of breath.   ALPRAZolam (XANAX) 0.25 MG tablet Take 1 tablet (0.25 mg total) by mouth at bedtime as needed for sleep. (Patient taking differently: Take 0.25 mg by mouth daily as needed for sleep or anxiety.)   aspirin EC 81 MG tablet Take 1 tablet (81 mg total) by mouth daily.   atorvastatin (LIPITOR) 80 MG tablet TAKE 1 TABLET BY MOUTH EVERY DAY (Patient taking differently: Take 80 mg by mouth every evening.)   B Complex-Biotin-FA (B-100 COMPLEX PO) Take 1 tablet by mouth every evening.    bismuth subsalicylate (PEPTO BISMOL) 262 MG/15ML suspension Take 30 mLs by mouth every 6 (six) hours as needed for indigestion or diarrhea or loose stools.   Budeson-Glycopyrrol-Formoterol (BREZTRI AEROSPHERE) 160-9-4.8 MCG/ACT AERO Inhale 2 puffs into the lungs in the morning and at bedtime.   cetirizine (ZYRTEC) 10 MG tablet Take 10 mg by mouth daily as needed for allergies.   cholecalciferol (VITAMIN D3) 25 MCG (1000 UNIT) tablet Take 1,000 Units by mouth every evening.   CORDRAN 4 MCG/SQCM TAPE Apply 1 each topically daily as needed (Rash).    cycloSPORINE (RESTASIS) 0.05 % ophthalmic emulsion Place 1 drop into both eyes 2 (two) times daily.    ezetimibe (ZETIA) 10 MG tablet Take 10 mg by mouth every evening.   gabapentin (NEURONTIN) 300 MG capsule Take 1 capsule (300 mg total) by mouth 3 (three) times daily. (Patient taking differently: Take 300 mg by mouth 2 (two) times daily.)   glucose blood (ACCU-CHEK SMARTVIEW) test strip CHECK BLOOD SUGAR ONCE DAILY AS NEEDED   hydrochlorothiazide (MICROZIDE) 12.5 MG capsule Take 12.5 mg by mouth daily.   isosorbide mononitrate (IMDUR) 30 MG 24 hr tablet Take 3 tablets ( 90 mg ) every morning and 1 tablet (30 mg ) every evening (Patient taking differently: Take 30-90 mg by mouth See admin instructions. Take 3 tablets ( 90 mg ) every morning and 1 or 2 tablets (30 - 60 mg ) every evening)   losartan (COZAAR) 100 MG tablet Take 1 tablet (100 mg total) by mouth daily.   metoprolol succinate (TOPROL XL) 25 MG 24 hr tablet Take 1 tablet (25 mg total) by mouth daily.   Multiple Vitamins-Minerals (MULTIVITAMIN WITH MINERALS) tablet  Take 1 tablet by mouth every evening.   nitroGLYCERIN (NITROSTAT) 0.4 MG SL tablet Place 1 tablet (0.4 mg total) under the tongue every 5 (five) minutes as needed for chest pain.   omeprazole (PRILOSEC) 40 MG capsule Take 1 capsule (40 mg total) by mouth in the morning and at bedtime.   oxyCODONE-acetaminophen (PERCOCET) 10-325 MG  tablet Take 1 tablet by mouth in the morning and at bedtime. Can take one tablet three times a day as needed for back pain   polyethylene glycol (MIRALAX / GLYCOLAX) 17 g packet Take 17 g by mouth daily as needed for moderate constipation.   prasugrel (EFFIENT) 5 MG TABS tablet TAKE 1 TABLET (5 MG TOTAL) BY MOUTH DAILY.   PRESCRIPTION MEDICATION Place 2 puffs into both ears daily as needed (ear infections). [facility] ear powder   Simethicone (GAS-X PO) Take 1-2 tablets by mouth daily as needed (gas).   tretinoin (RETIN-A) 0.1 % cream Apply 1 application. topically every evening.   No facility-administered encounter medications on file as of 08/13/2022.     Review of Systems  Review of Systems  No chest pain with exertion.  No orthopnea or PND.  Comprehensive review of systems otherwise negative. Physical Exam  BP 124/62 (BP Location: Left Arm, Patient Position: Sitting, Cuff Size: Normal)   Pulse 71   Temp 98.4 F (36.9 C) (Oral)   Ht 5' (1.524 m)   Wt 131 lb (59.4 kg)   SpO2 93%   BMI 25.58 kg/m   Wt Readings from Last 5 Encounters:  08/13/22 131 lb (59.4 kg)  07/30/22 127 lb 3.2 oz (57.7 kg)  07/15/22 126 lb (57.2 kg)  07/07/22 128 lb 6.4 oz (58.2 kg)  07/05/22 128 lb (58.1 kg)    BMI Readings from Last 5 Encounters:  08/13/22 25.58 kg/m  07/30/22 24.84 kg/m  07/15/22 25.45 kg/m  07/07/22 25.93 kg/m  07/05/22 25.85 kg/m     Physical Exam General: Well-appearing, sitting in chair Eyes: EOMI, no icterus Neck: Supple, no JVP Pulmonary: Clear, normal work of breathing Cardiovascular warm, no edema Abdomen: Nondistended, bowel sounds present MSK: No synovitis, no joint effusion Neuro: Normal gait, no weakness Psych: Normal mood, full affect   Assessment & Plan:   Dyspnea on exertion: Likely multifactorial related to cardiac issues, worsening diastology with exercise given significant left atrial dilation.  Also suspect large contribution from emphysema.  Severe  emphysema on imaging.  Interval improvement on Breztri.  To continue.  Encouraged increasing exercise activity back to baseline.  Emphysema: Severe on imaging.  Continue Breztri.   Return in about 4 months (around 12/13/2022).   Lanier Clam, MD 08/13/2022

## 2022-08-16 NOTE — Progress Notes (Signed)
Chronic Care Management Pharmacy Note  08/25/2022 Name:  Megan [name] MRN:  [id number] DOB:  Jul 24, 1946  Summary: PharmD FU, adherence has improved on Breztri daily now.  Albuterol use has decreased.  LDL is improving after initiation of Zetia.  Recommendations/Changes made from today's visit: No change - continue screenings,  Reapply for [place] and [place] in December  Plan: FU in December for renewal   Subjective: Megan [name] is an 76 y.o. year old female who is a primary patient of [name], MD.  The CCM team was consulted for assistance with disease management and care coordination needs.    Engaged with patient by telephone for follow up visit in response to provider referral for pharmacy case management and/or care coordination services.   Consent to Services:  The patient was given the following information about Chronic Care Management services today, agreed to services, and gave verbal consent: 1. CCM service includes personalized support from designated clinical staff supervised by the primary care provider, including individualized plan of care and coordination with other care providers 2. 24/7 contact phone numbers for assistance for urgent and routine care needs. 3. Service will only be billed when office clinical staff spend 20 minutes or more in a month to coordinate care. 4. Only one practitioner may furnish and bill the service in a calendar month. 5.The patient may stop CCM services at any time (effective at the end of the month) by phone call to the office staff. 6. The patient will be responsible for cost sharing (co-pay) of up to 20% of the service fee (after annual deductible is met). Patient agreed to services and consent obtained.  Patient Care Team: [name], MD as PCP - General (Family Medicine) Martinique, Peter M, MD as PCP - Cardiology (Cardiology) Martinique, Peter M, MD as Consulting Physician (Cardiology) Vicie Mutters, MD as Consulting Physician  (Otolaryngology) Ladene Artist, MD as Consulting Physician (Gastroenterology) Penni Bombard, MD as Consulting Physician (Neurology) Reynold Bowen, MD as Consulting Physician (Endocrinology) Sharyne Peach, MD as Consulting Physician (Ophthalmology) Renelda Mom, DMD as Consulting Physician (Dentistry) Rolm Bookbinder, MD as Consulting Physician (Dermatology) Manson Passey, [place] (Specialist) Woodfin Ganja (Dentistry) Edythe Clarity, [facility] as Pharmacist (Pharmacist)  Recent office visits:  12/17/2021 OV (PCP) [name], MD; no medication changes indicated.   11/12/2021 VV (Family Medicine) Lucretia Kern, DO; molnupiravir 200 mg x 5 days for COVID-19   Recent consult visits:  08/13/22 ([place], Pulm) - severe emphysema on imaging, started on Breztri with some improvement, will continue.  07/30/22 (Martinique, [place]) -  01/11/2022 OV (Cardiology) Martinique, Peter M, MD; LDL is a little above goal. Added Zetia and will repeat lab in 3 months.   11/09/2021 OV (Endocrinology) Shamleffer, Melanie Crazier, MD; no medication changes indicated.   09/30/2021 OV (Vascular Surgeon) Waynetta Sandy, MD; no medication changes indicated.   09/23/2021 OV (Cardiology) Martinique, Peter M, MD; no medication changes indicated.   09/09/2021 OV (Neurology) Penumalli, Earlean Polka, MD; no medication changes indicated.   09/02/2021 OV (Neurology) Lomax, Amy, NP; no medication changes indicated.   08/31/2021 OV (Cardiology) Martinique, Peter M, MD; no medication changes indicated.   08/25/2021 OV (Ophthalmology) Jola Schmidt; no further information available.   Objective:  Lab Results  Component Value Date   CREATININE 0.74 07/11/2022   BUN 10 07/11/2022   GFR 86.76 03/16/2021   EGFR 75 06/29/2022   GFRNONAA >60 07/11/2022   GFRAA 89 01/01/2020   NA 140 07/11/2022  K 3.6 07/11/2022   CALCIUM 9.2 07/11/2022   CO2 24 07/11/2022   GLUCOSE 109 (H) 07/11/2022    Lab Results  Component Value  Date/Time   HGBA1C 5.8 (A) 06/21/2022 12:38 PM   HGBA1C 5.8 (H) 01/06/2022 09:54 AM   HGBA1C 5.7 (H) 04/20/2021 08:49 AM   GFR 86.76 03/16/2021 01:47 PM   GFR 66.56 01/19/2021 02:48 PM    Last diabetic Eye exam: No results found for: "HMDIABEYEEXA"  Last diabetic Foot exam: No results found for: "HMDIABFOOTEX"   Lab Results  Component Value Date   CHOL 142 04/05/2022   HDL 51 04/05/2022   LDLCALC 57 04/05/2022   LDLDIRECT 82.0 06/13/2020   TRIG 209 (H) 04/05/2022   CHOLHDL 2.8 04/05/2022       Latest Ref Rng & Units 04/05/2022   11:50 AM 03/19/2022    9:16 AM 01/06/2022    9:54 AM  Hepatic Function  Total Protein 6.0 - 8.5 g/dL 5.3  5.7  5.7   Albumin 3.7 - 4.7 g/dL 3.7  3.3  3.9   AST 0 - 40 IU/L _0 ALT 0 - 32 IU/L _1 Alk Phosphatase 44 - 121 IU/L 74  65  87   Total Bilirubin 0.0 - 1.2 mg/dL 0.4  0.9  0.6   Bilirubin, Direct 0.00 - 0.40 mg/dL 0.16   0.21     Lab Results  Component Value Date/Time   TSH 1.62 11/09/2021 10:40 AM   TSH 0.714 06/27/2021 03:51 AM   TSH 1.81 06/13/2020 10:17 AM   FREET4 0.85 11/09/2021 10:40 AM   FREET4 1.05 06/27/2021 03:51 AM       Latest Ref Rng & Units 07/11/2022    7:12 PM 06/29/2022    3:38 PM 03/19/2022    9:16 AM  CBC  WBC 4.0 - 10.5 K/uL 9.8  9.7  8.3   Hemoglobin 12.0 - 15.0 g/dL 13.8  15.2  13.8   Hematocrit 36.0 - 46.0 % 40.7  45.3  43.3   Platelets 150 - 400 K/uL 221  272  207     No results found for: "VD25OH"  Clinical ASCVD: Yes  The ASCVD Risk score (Arnett DK, et al., 2019) failed to calculate for the following reasons:   The patient has a prior MI or stroke diagnosis       06/21/2022   11:13 AM 06/19/2021   11:03 AM 06/13/2020   10:30 AM  Depression screen PHQ 2/9  Decreased Interest 0 0 0  Down, Depressed, Hopeless 0 0 0  PHQ - 2 Score 0 0 0     Social History   Tobacco Use  Smoking Status Former   Packs/day: 0.00   Years: 37.00   Total pack years: 0.00   Types: Cigarettes   Quit  date: 12/07/1995   Years since quitting: 26.7  Smokeless Tobacco Never   BP Readings from Last 3 Encounters:  08/13/22 124/62  07/30/22 (!) 112/56  07/15/22 (!) 140/56   Pulse Readings from Last 3 Encounters:  08/13/22 71  07/30/22 75  07/15/22 63   Wt Readings from Last 3 Encounters:  08/13/22 131 lb (59.4 kg)  07/30/22 127 lb 3.2 oz (57.7 kg)  07/15/22 126 lb (57.2 kg)   BMI Readings from Last 3 Encounters:  08/13/22 25.58 kg/m  07/30/22 24.84 kg/m  07/15/22 25.45 kg/m    Assessment/Interventions: Review of patient past medical history, allergies, medications,  health status, including review of consultants reports, laboratory and other test data, was performed as part of comprehensive evaluation and provision of chronic care management services.   SDOH:  (Social Determinants of Health) assessments and interventions performed: No, done within year Tobacco Use: Medium Risk (08/13/2022)   Patient History    Smoking Tobacco Use: Former    Smokeless Tobacco Use: Never    Passive Exposure: Not on file    Food Insecurity: No Food Insecurity (07/28/2022)   Hunger Vital Sign    Worried About Running Out of Food in the Last Year: Never true    [place] in the Last Year: Never true    SDOH Interventions    Flowsheet Row Patient Outreach Telephone from 07/28/2022 in [place] from 06/13/2020 in [place] Interventions Intervention Not Indicated --  Housing Interventions Intervention Not Indicated --  Transportation Interventions Intervention Not Indicated --  Physical Activity Interventions -- Intervention Not Indicated      Financial Resource Strain: Low Risk  (06/13/2020)   Overall Financial Resource Strain (CARDIA)    Difficulty of Paying Living Expenses: Not hard at all   Food Insecurity: No Food Insecurity (07/28/2022)   Hunger Vital Sign     Worried About Running Out of Food in the Last Year: Never true    Ran Out of Food in the Last Year: Never true    SDOH Screenings   Food Insecurity: No Food Insecurity (07/28/2022)  Housing: Low Risk  (07/28/2022)  Transportation Needs: No Transportation Needs (07/28/2022)  Depression (PHQ2-9): Low Risk  (06/21/2022)  Financial Resource Strain: Low Risk  (06/13/2020)  Physical Activity: Insufficiently Active (06/13/2020)  Social Connections: Moderately Isolated (06/13/2020)  Stress: No Stress Concern Present (06/13/2020)  Tobacco Use: Medium Risk (08/13/2022)    CCM Care Plan  Allergies  Allergen Reactions   Brilinta [Ticagrelor] Shortness Of Breath   Bextra [Valdecoxib] Other (See Comments)    Increased lft's    Hydrocod Poli-Chlorphe Poli Er Rash   Lipitor [Atorvastatin] Other (See Comments)    Leg weakness    Mevacor [Lovastatin] Other (See Comments)    Muscle weakness    Plavix [Clopidogrel Bisulfate] Itching and Other (See Comments)    Hands and feet tingle and itch   Zocor [Simvastatin] Other (See Comments)    Bones hurt    Doxycycline Diarrhea and Nausea And Vomiting   Metoprolol Other (See Comments)    Hair loss   Morphine And Related Itching and Nausea Only   Codeine Itching and Rash    Hyper-active   Lisinopril Cough    Medications Reviewed Today     Reviewed by Edythe Clarity, [facility] (Pharmacist) on 08/25/22 at 1328  Med List Status: <None>   Medication Order Taking? Sig Documenting Provider Last Dose Status Informant  Accu-Chek FastClix Lancets MISC [id number] No Check blood sugar once daily PRN [name], MD Taking Active Self           Med Note Alfonse Spruce, ANH T   Tue Jan 06, 2021  1:20 PM)    albuterol (VENTOLIN HFA) 108 (90 Base) MCG/ACT inhaler [id number] No Inhale 2 puffs into the lungs every 6 (six) hours as needed for wheezing or shortness of breath. Martinique, Peter M, MD Taking Active Self  ALPRAZolam Duanne Moron) 0.25 MG tablet [id number] No Take 1 tablet (0.25  mg total) by mouth at bedtime as needed for  sleep.  Patient taking differently: Take 0.25 mg by mouth daily as needed for sleep or anxiety.   Martinique, Peter M, MD Taking Active Self  aspirin EC 81 MG tablet [id number] No Take 1 tablet (81 mg total) by mouth daily. Martinique, Peter M, MD Taking Active Self  atorvastatin (LIPITOR) 80 MG tablet [id number]  TAKE 1 TABLET BY MOUTH EVERY DAY Martinique, Peter M, MD  Active   B Complex-Biotin-FA (B-100 COMPLEX PO) [id number] No Take 1 tablet by mouth every evening. [provider] Taking Active Self  bismuth subsalicylate (PEPTO BISMOL) 262 MG/15ML suspension [id number] No Take 30 mLs by mouth every 6 (six) hours as needed for indigestion or diarrhea or loose stools. [provider] Taking Active Self  Budeson-Glycopyrrol-Formoterol (BREZTRI AEROSPHERE) 160-9-4.8 MCG/ACT AERO [id number] No Inhale 2 puffs into the lungs in the morning and at bedtime. [provider] Taking Active Self  cetirizine (ZYRTEC) 10 MG tablet [id number] No Take 10 mg by mouth daily as needed for allergies. [provider] Taking Active Self  cholecalciferol (VITAMIN D3) 25 MCG (1000 UNIT) tablet [id number] No Take 1,000 Units by mouth every evening. [provider] Taking Active Self  CORDRAN 4 MCG/SQCM TAPE [id number] No Apply 1 each topically daily as needed (Rash).  [provider] Taking Active Self  cycloSPORINE (RESTASIS) 0.05 % ophthalmic emulsion [id number] No Place 1 drop into both eyes 2 (two) times daily.  [provider] Taking Active Self  ezetimibe (ZETIA) 10 MG tablet [id number] No Take 10 mg by mouth every evening. [provider] Taking Active Self  gabapentin (NEURONTIN) 300 MG capsule [id number] No Take 1 capsule (300 mg total) by mouth 3 (three) times daily.  Patient taking differently: Take 300 mg by mouth 2 (two) times daily.   Lomax, Amy, NP Taking Active Self  glucose blood (ACCU-CHEK SMARTVIEW) test strip  [id number] No CHECK BLOOD SUGAR ONCE DAILY AS NEEDED [name], MD Taking Active Self  hydrochlorothiazide (MICROZIDE) 12.5 MG capsule [id number]  Take 1 capsule (12.5 mg total) by mouth daily. Martinique, Peter M, MD  Active   isosorbide mononitrate (IMDUR) 30 MG 24 hr tablet [id number] No Take 3 tablets ( 90 mg ) every morning and 1 tablet (30 mg ) every evening  Patient taking differently: Take 30-90 mg by mouth See admin instructions. Take 3 tablets ( 90 mg ) every morning and 1 or 2 tablets (30 - 60 mg ) every evening   Loel Dubonnet, NP Taking Active Self  losartan (COZAAR) 100 MG tablet [id number] No Take 1 tablet (100 mg total) by mouth daily. [name], MD Taking Active Self  metoprolol succinate (TOPROL XL) 25 MG 24 hr tablet [id number] No Take 1 tablet (25 mg total) by mouth daily. Martinique, Peter M, MD Taking Active Self  Multiple Vitamins-Minerals (MULTIVITAMIN WITH MINERALS) tablet [id number] No Take 1 tablet by mouth every evening. [provider] Taking Active Self  nitroGLYCERIN (NITROSTAT) 0.4 MG SL tablet [id number] No Place 1 tablet (0.4 mg total) under the tongue every 5 (five) minutes as needed for chest pain. Martinique, Peter M, MD Taking Active   omeprazole (PRILOSEC) 40 MG capsule [id number] No Take 1 capsule (40 mg total) by mouth in the morning and at bedtime. Ladene Artist, MD Taking Active Self  oxyCODONE-acetaminophen (PERCOCET) 10-325 MG tablet [id number] No Take 1 tablet by mouth in the morning and at bedtime. Can take one tablet three times a day as needed for back pain [provider] Taking Active Self  polyethylene glycol (MIRALAX / GLYCOLAX) 17 g packet [id number] No Take 17 g by mouth daily as needed for moderate constipation. [provider] Taking Active Self  prasugrel (EFFIENT) 5 MG TABS tablet [id number] No TAKE 1 TABLET (5 MG TOTAL) BY MOUTH DAILY. Martinique, Peter M, MD Taking Active Self  PRESCRIPTION MEDICATION [id number] No Place 2  puffs into both ears daily as needed (ear infections). [facility] ear powder [provider] Taking Active Self  Simethicone (GAS-X PO) [id number] No Take 1-2 tablets by mouth daily as needed (gas). [provider] Taking Active Self  tretinoin (RETIN-A) 0.1 % cream [id number] No Apply 1 application. topically every evening. [provider] Taking Active Self            Patient Active Problem List   Diagnosis Date Noted   S/P lumbar laminectomy 03/24/2022   Thyroid nodule greater than or equal to 1.5 cm in diameter incidentally noted on imaging study 08/27/2021   Angina pectoris ([place]) 06/25/2021   History of compression fracture of spine - lumbar 06/19/2021   History of lumbosacral spine surgery 03/03/2021   NSTEMI (non-ST elevated myocardial infarction) ([place]) 2022 01/31/2021   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/26/2021   Neuropathic pain 12/05/2020   Gait instability 09/09/2020   Lumbar stenosis without neurogenic claudication 02/26/2020   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   Essential (primary) hypertension 07/10/2018   IFG (impaired fasting glucose) 11/10/2017   DJD (degenerative joint disease), lumbosacral 11/10/2017   Fibromyalgia 11/10/2017   Dyshidrotic eczema 11/10/2017   GERD (gastroesophageal reflux disease) 11/10/2017   Osteopenia 11/10/2017   Does use hearing aid 11/10/2017   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   Fatty liver 11/10/2017   Coronary artery disease    Peripheral vascular disease with claudication ([place])    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   History of abdominal aortic aneurysm repair 05/18/2011   History of renal artery stenosis 05/18/2011   Hyperlipidemia LDL goal <70     Immunization History  Administered Date(s) Administered   Fluad Quad(high Dose 65+) 07/30/2019, 08/10/2022   Influenza, High Dose Seasonal PF 08/13/2017, 08/26/2018, 09/15/2020   Influenza, Seasonal, Injecte, Preservative Fre 09/21/2014,  09/06/2015, 07/21/2016   Influenza,inj,quad, With Preservative 08/06/2017   Influenza-Unspecified 08/10/2017, 10/13/2021   PFIZER(Purple Top)SARS-COV-2 Vaccination 12/22/2019, 01/12/2020, 09/15/2020   PPD Test 09/13/1979, 03/10/2021   Pfizer Covid-19 Vaccine Bivalent Booster 76yr & up 10/13/2021   Pneumococcal Conjugate-13 12/31/2016   Pneumococcal Polysaccharide-23 12/16/2014, 08/01/2019   Td 05/13/2008   Tdap 08/09/2012   Zoster Recombinat (Shingrix) 01/13/2018, 03/14/2018   Zoster, Live 11/03/2010    Conditions to be addressed/monitored:  CAD, HTN, Nstemi, GERD, Osteopenia, HLD,   Care Plan : General Pharmacy (Adult)  Updates made by DEdythe Clarity RPH since 08/25/2022 12:00 AM     Problem: CAD, HTN, Nstemi, GERD, Osteopenia, HLD,   Priority: High  Onset Date: 02/10/2022     Long-Range Goal: Patient-Specific Goal   Start Date: 02/10/2022  Expected End Date: 08/13/2022  Recent Progress: On track  Priority: High  Note:   Current Barriers:  Inhaler affordability LDL slightly above goal  Pharmacist Clinical Goal(s):  Patient will verbalize ability to afford treatment regimen achieve improvement in LDL as evidenced by labs through collaboration with PharmD and provider.   Interventions: 1:1 collaboration with ALeamon Arnt MD regarding development and update of comprehensive plan of care as evidenced by provider attestation and co-signature Inter-disciplinary care team collaboration (see longitudinal plan of care)  Comprehensive medication review performed; medication list updated in electronic medical record  Hypertension (BP goal <130/80) -Controlled, not assessed -Current treatment: Losartan 174m daily Appropriate, Effective, Safe, Accessible HCTZ 12.587mAppropriate, Effective, Safe, Accessible Isosorbide mononitrate 3076mppropriate, Effective, Safe, Accessible Metoprolol tartrate 59m51me and one-half tablets BID Appropriate, Effective, Safe,  Accessible -Medications previously tried: amlodipine, hydralazine  -Current home readings: "normal"  no logs today -Current exercise habits: patient reports walking 1-2 miles per day  -Reports hypotensive/hypertensive symptoms - she is tired throughout the day - not to the point where she dozes off quickly but if she sits down in a chair she may fall asleep while watching TV -Educated on BP goals and benefits of medications for prevention of heart attack, stroke and kidney damage; Daily salt intake goal < 2300 mg; Importance of home blood pressure monitoring; Symptoms of hypotension and importance of maintaining adequate hydration; -Counseled to monitor BP at home a few times per week, document, and provide log at future appointments -Recommended to continue current medication No changes at this time - continue to monitor beta blocker dose to see if HR is controlled - this could be contributing to daytime sleepiness.  Hyperlipidemia/CAD/NSTEMI: (LDL goal < 55) -Controlled, LDL now 57!! -Current treatment: Atorvastatin 80mg37mropriate, Effective, Safe, Accessible Zetia 10mg 52mt started) Appropriate,Effective, Safe, Accessible -Medications previously tried: none noted  -Educated on Cholesterol goals;  Benefits of statin for ASCVD risk reduction; Importance of limiting foods high in cholesterol; -Recommended to continue current medication Started Zetia after LDL of 54 - goal now < 55 due to high risk.  Most recent lipid panel very close to new goal. Continue on meds I believe she will get there in continues with same lifesyle. No changes needed at this time.  Osteopenia (Goal Reduce fracture risk) -Controlled, not assessed -Last DEXA Scan: 11/08/2019   T-Score femoral neck: -1.3  T-Score lumbar spine: -0.6 -Patient is not a candidate for pharmacologic treatment -Current treatment  Vitamin D 1000 IU daily Appropriate, Effective, Safe, Accessible -Medications previously tried: none  noted  -Recommend [id number] units of vitamin D daily. Recommend 1200 mg of calcium daily from dietary and supplemental sources. -Recommended to continue current medication Due for repeat bone density every two years.  COPD (Goal: control symptoms and prevent exacerbations) 08/22/22 -Controlled, inhaler adherence improved -Current treatment  Breztri 160-9-4.8mcg 235mffs twice daily Appropriate,  Effective, Safe, Accessible Albuterol HFA 90mcg p66mppropriate, Effective, Safe, Accessible -Medications previously tried: Combivent  -Pulmonary function testing: Pulmonary Functions Testing Results: No results found for: FEV1, FVC, FEV1FVC, TLC, DLCO -Exacerbations requiring treatment in last 6 months: none -Patient denies consistent use of maintenance inhaler -Frequency of rescue inhaler use: minimal -Counseled on Proper inhaler technique; Benefits of consistent maintenance inhaler use When to use rescue inhaler Differences between maintenance and rescue inhalers -Recommended to continue current medication -She is now using her Breztri every day as maintenance as prescribed and reports she is using her albuterol less frequently than before!  Does still mention SOB with exercise but overall improved breathing.   We will need to reapply before the end of the year [place]. Will set up FU call for December with CMA.  GERD (Goal: Reduce symptoms) -Controlled, not assessed -Current treatment  Omeprazole 40mg twi4maily Query Appropriate -Medications previously tried: none noted -She has tried to go off PPI with no luck.  Controls symptoms as she is currently taking it.  -Recommended to continue current medication Work  on trigger foods, no changes at this time.  Patient Goals/Self-Care Activities Patient will:  - take medications as prescribed as evidenced by patient report and record review focus on medication adherence by pill packs at [facility] check blood  pressure a few times per week, document, and provide at future appointments collaborate with provider on medication access solutions  Follow Up Plan: The care management team will reach out to the patient again over the next 180 days.           Medication Assistance: Application for Breztri  medication assistance program. in process.  Anticipated assistance start date 02/09/22.  See plan of care for additional detail.  Compliance/Adherence/Medication fill history: Care Gaps: DEXA scan  Star-Rating Drugs: Atorvastatin 80 mg last filled 08/19/22 90 DS Losartan 50 mg last filled 06/21/22 90 DS  Patient's preferred pharmacy is:  [id number] [id number]: [phone]: [phone] Uses pill box? Yes Pt endorses 100% compliance  We discussed: Benefits of medication synchronization, packaging and delivery as well as enhanced pharmacist oversight with [facility]. Patient decided to: Utilize [facility] pharmacy for medication synchronization, packaging and delivery  Verbal consent obtained for [facility] enhanced pharmacy services (medication synchronization, adherence packaging, delivery coordination). A medication sync plan was created to allow patient to get all medications delivered once every 30 to 90 days per patient preference. Patient understands they have freedom to choose pharmacy and clinical pharmacist will coordinate care between all prescribers and [facility].  Care Plan and Follow Up Patient Decision:  Patient agrees to Care Plan and Follow-up.  Plan: The care management team will reach out to the patient again over the next 180 days.  CBeverly Milch PharmD Clinical Pharmacist  [facility]([phone]

## 2022-08-17 ENCOUNTER — Encounter: Payer: Self-pay | Admitting: Cardiology

## 2022-08-17 MED ORDER — HYDROCHLOROTHIAZIDE 12.5 MG PO CAPS: 12.5000 mg | ORAL_CAPSULE | Freq: Every day | ORAL | 3 refills | Status: DC

## 2022-08-18 ENCOUNTER — Other Ambulatory Visit: Payer: Self-pay | Admitting: Cardiology

## 2022-08-19 DIAGNOSIS — M7061 Trochanteric bursitis, right hip: Secondary | ICD-10-CM | POA: Diagnosis not present

## 2022-08-23 ENCOUNTER — Ambulatory Visit: Payer: Medicare Other | Admitting: Pharmacist

## 2022-08-23 DIAGNOSIS — E785 Hyperlipidemia, unspecified: Secondary | ICD-10-CM

## 2022-08-23 DIAGNOSIS — J449 Chronic obstructive pulmonary disease, unspecified: Secondary | ICD-10-CM

## 2022-08-25 NOTE — Patient Instructions (Addendum)
Visit Information   Goals Addressed             This Visit's Progress    Improve My Heart Health-Coronary Artery Disease   On track    Timeframe:  Long-Range Goal Priority:  High Start Date:  02/10/22                           Expected End Date: 08/13/22                      Follow Up Date 05/13/22    Improve LDL, continue active lifestyle   Why is this important?   Lifestyle changes are key to improving the blood flow to your heart. Think about the things you can change and set a goal to live healthy.  Remember, when the blood vessels to your heart start to get clogged you may not have any symptoms.  Over time, they can get worse.  Don't ignore the signs, like chest pain, and get help right away.     Notes:        Patient Care Plan: General Pharmacy (Adult)     Problem Identified: CAD, HTN, Nstemi, GERD, Osteopenia, HLD,   Priority: High  Onset Date: 02/10/2022     Long-Range Goal: Patient-Specific Goal   Start Date: 02/10/2022  Expected End Date: 08/13/2022  Recent Progress: On track  Priority: High  Note:   Current Barriers:  Inhaler affordability LDL slightly above goal  Pharmacist Clinical Goal(s):  Patient will verbalize ability to afford treatment regimen achieve improvement in LDL as evidenced by labs through collaboration with PharmD and provider.   Interventions: 1:1 collaboration with [name], MD regarding development and update of comprehensive plan of care as evidenced by provider attestation and co-signature Inter-disciplinary care team collaboration (see longitudinal plan of care) Comprehensive medication review performed; medication list updated in electronic medical record  Hypertension (BP goal <130/80) -Controlled, not assessed -Current treatment: Losartan '100mg'$  daily Appropriate, Effective, Safe, Accessible HCTZ 12.'5mg'$  Appropriate, Effective, Safe, Accessible Isosorbide mononitrate '30mg'$  Appropriate, Effective, Safe,  Accessible Metoprolol tartrate '25mg'$  one and one-half tablets BID Appropriate, Effective, Safe, Accessible -Medications previously tried: amlodipine, hydralazine  -Current home readings: "normal"  no logs today -Current exercise habits: patient reports walking 1-2 miles per day  -Reports hypotensive/hypertensive symptoms - she is tired throughout the day - not to the point where she dozes off quickly but if she sits down in a chair she may fall asleep while watching TV -Educated on BP goals and benefits of medications for prevention of heart attack, stroke and kidney damage; Daily salt intake goal < 2300 mg; Importance of home blood pressure monitoring; Symptoms of hypotension and importance of maintaining adequate hydration; -Counseled to monitor BP at home a few times per week, document, and provide log at future appointments -Recommended to continue current medication No changes at this time - continue to monitor beta blocker dose to see if HR is controlled - this could be contributing to daytime sleepiness.  Hyperlipidemia/CAD/NSTEMI: (LDL goal < 55) -Controlled, LDL now 57!! -Current treatment: Atorvastatin '80mg'$  Appropriate, Effective, Safe, Accessible Zetia '10mg'$  (Just started) Appropriate,Effective, Safe, Accessible -Medications previously tried: none noted  -Educated on Cholesterol goals;  Benefits of statin for ASCVD risk reduction; Importance of limiting foods high in cholesterol; -Recommended to continue current medication Started Zetia after LDL of 54 - goal now < 55 due to high risk.  Most recent lipid panel  very close to new goal. Continue on meds I believe she will get there in continues with same lifesyle. No changes needed at this time.  Osteopenia (Goal Reduce fracture risk) -Controlled, not assessed -Last DEXA Scan: 11/08/2019   T-Score femoral neck: -1.3  T-Score lumbar spine: -0.6 -Patient is not a candidate for pharmacologic treatment -Current treatment  Vitamin  D 1000 IU daily Appropriate, Effective, Safe, Accessible -Medications previously tried: none noted  -Recommend [phone] units of vitamin D daily. Recommend 1200 mg of calcium daily from dietary and supplemental sources. -Recommended to continue current medication Due for repeat bone density every two years.  COPD (Goal: control symptoms and prevent exacerbations) 08/22/22 -Controlled, inhaler adherence improved -Current treatment  Breztri 160-9-4.89mg 2 puffs twice daily Appropriate,  Effective, Safe, Accessible Albuterol HFA 952m prn Appropriate, Effective, Safe, Accessible -Medications previously tried: Combivent  -Pulmonary function testing: Pulmonary Functions Testing Results: No results found for: FEV1, FVC, FEV1FVC, TLC, DLCO -Exacerbations requiring treatment in last 6 months: none -Patient denies consistent use of maintenance inhaler -Frequency of rescue inhaler use: minimal -Counseled on Proper inhaler technique; Benefits of consistent maintenance inhaler use When to use rescue inhaler Differences between maintenance and rescue inhalers -Recommended to continue current medication -She is now using her Breztri every day as maintenance as prescribed and reports she is using her albuterol less frequently than before!  Does still mention SOB with exercise but overall improved breathing.   We will need to reapply before the end of the year to continue [place] [employer] [place]. Will set up FU call for December with CMA.  GERD (Goal: Reduce symptoms) -Controlled, not assessed -Current treatment  Omeprazole '40mg'$  twice daily Query Appropriate -Medications previously tried: none noted -She has tried to go off PPI with no luck.  Controls symptoms as she is currently taking it.  -Recommended to continue current medication Work on trigger foods, no changes at this time.  Patient Goals/Self-Care Activities Patient will:  - take medications as prescribed as evidenced by patient  report and record review focus on medication adherence by pill packs at [facility] check blood pressure a few times per week, document, and provide at future appointments collaborate with provider on medication access solutions  Follow Up Plan: The care management team will reach out to the patient again over the next 180 days.          The patient verbalized understanding of instructions, educational materials, and care plan provided today and DECLINED offer to receive copy of patient instructions, educational materials, and care plan.  Telephone follow up appointment with pharmacy team member scheduled for: 6 months  ChEdythe ClarityRPMarshallPharmD Clinical Pharmacist  [phone]

## 2022-09-20 DIAGNOSIS — Z9889 Other specified postprocedural states: Secondary | ICD-10-CM | POA: Diagnosis not present

## 2022-09-20 DIAGNOSIS — M7061 Trochanteric bursitis, right hip: Secondary | ICD-10-CM | POA: Diagnosis not present

## 2022-09-30 DIAGNOSIS — M25561 Pain in right knee: Secondary | ICD-10-CM | POA: Diagnosis not present

## 2022-10-25 ENCOUNTER — Encounter: Payer: Self-pay | Admitting: Cardiology

## 2022-10-26 DIAGNOSIS — M7061 Trochanteric bursitis, right hip: Secondary | ICD-10-CM | POA: Diagnosis not present

## 2022-10-31 DIAGNOSIS — M25561 Pain in right knee: Secondary | ICD-10-CM | POA: Diagnosis not present

## 2022-11-02 ENCOUNTER — Encounter: Payer: Self-pay | Admitting: Family Medicine

## 2022-11-03 MED ORDER — AZITHROMYCIN 250 MG PO TABS: ORAL_TABLET | ORAL | 0 refills | Status: DC

## 2022-11-04 ENCOUNTER — Encounter: Payer: Self-pay | Admitting: Cardiology

## 2022-11-05 ENCOUNTER — Encounter: Payer: Self-pay | Admitting: Family Medicine

## 2022-11-05 MED ORDER — AMLODIPINE BESYLATE 2.5 MG PO TABS: 2.5000 mg | ORAL_TABLET | Freq: Every day | ORAL | 3 refills | Status: DC

## 2022-11-05 MED ORDER — GUAIFENESIN-CODEINE 100-10 MG/5ML PO SOLN: 5.0000 mL | Freq: Four times a day (QID) | ORAL | 0 refills | Status: DC | PRN

## 2022-11-07 ENCOUNTER — Encounter: Payer: Self-pay | Admitting: Family Medicine

## 2022-11-08 ENCOUNTER — Encounter: Payer: Self-pay | Admitting: Family Medicine

## 2022-11-08 ENCOUNTER — Ambulatory Visit (INDEPENDENT_AMBULATORY_CARE_PROVIDER_SITE_OTHER): Payer: Medicare Other | Admitting: Family Medicine

## 2022-11-08 VITALS — BP 118/50 | HR 81 | Temp 98.2°F | Ht 60.0 in | Wt 132.4 lb

## 2022-11-08 DIAGNOSIS — J441 Chronic obstructive pulmonary disease with (acute) exacerbation: Secondary | ICD-10-CM

## 2022-11-08 DIAGNOSIS — I25708 Atherosclerosis of coronary artery bypass graft(s), unspecified, with other forms of angina pectoris: Secondary | ICD-10-CM

## 2022-11-08 MED ORDER — PREDNISONE 20 MG PO TABS: ORAL_TABLET | ORAL | 0 refills | Status: DC

## 2022-11-08 NOTE — Patient Instructions (Signed)
Please follow up as scheduled for your next visit with me: 12/22/2022   If you have any questions or concerns, please don't hesitate to send me a message via MyChart or call the office at [phone]. Thank you for visiting with [place] today! It's our pleasure caring for you.   Keep an eye on your pulse ox (oxygen level). Let me know if it is going low (<93% even after albuterol). I am hoping the prednisone will help you feel better.

## 2022-11-08 NOTE — Progress Notes (Signed)
Subjective  CC:  Chief Complaint  Patient presents with   Cough    Pt stated that she has a cough for the past week and has not gotten any better    HPI: Megan [name] is a 76 y.o. female who presents to the office today to address the problems listed above in the chief complaint. 76 yo with CAD and COPD w/ almost one week of URI/bronchitis sxs that have progressed; c/o sob (although feeling a little better today). Started with ST, cough and congestion: on day five of Zpak. Harsh productive cough with DOE and dyspnea while talking late last week. Doing better with that today. Using albuterol bid w/ some relief. Robitussin AC for cough but still hacking. No hemoptysis or pleuritic cp. No sig angina. Appetite is ok. Sleep has been affected.  See phone messages.   Assessment  1. COPD with acute exacerbation ([place])   2. Coronary artery disease of bypass graft of native heart with stable angina pectoris ([facility])      Plan  Copd exacerbation:  pulse ox is a little low for her but lungs are clear. Complete zpak and add prednisone 60 daily for 5 days. Continue breztri and prn albuterol. Continue cough syrup. Since no fevers, clear lungs and mild improvement, will not add a different antibiotic at this time but if not improving, would change to augmentin. Pt will let me know if not improving in 48 hours. Will check cxr at that time as well.   Follow up: as scheduled.   12/22/2022  No orders of the defined types were placed in this encounter.  Meds ordered this encounter  Medications   predniSONE (DELTASONE) 20 MG tablet    Sig: Take 3 tabs daily for 5 days    Dispense:  15 tablet    Refill:  0      I reviewed the patients updated PMH, FH, and SocHx.    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    Priority: High   Coronary artery disease     Priority: High   Peripheral vascular disease with claudication ([place])     Priority: High   History of abdominal aortic aneurysm repair 05/18/2011    Priority: High   Hyperlipidemia LDL goal <70     Priority: High   History of compression fracture of spine - lumbar 06/19/2021    Priority: Medium    History of lumbosacral spine surgery 03/03/2021    Priority: Medium    Neuropathic pain 12/05/2020    Priority: Medium    Gait instability 09/09/2020    Priority: Medium    Lumbar stenosis without neurogenic claudication 02/26/2020    Priority: Medium    DJD (degenerative joint disease), lumbosacral 11/10/2017    Priority: Medium    Fibromyalgia 11/10/2017    Priority: Medium    GERD (gastroesophageal reflux disease) 11/10/2017    Priority: Medium    Fatty liver 11/10/2017    Priority: Medium    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    Priority: Medium    History of renal artery stenosis 05/18/2011    Priority: Medium    Asymmetric SNHL (sensorineural hearing  loss) 01/26/2021    Priority: Low   Dyshidrotic eczema 11/10/2017    Priority: Low   Osteopenia 11/10/2017    Priority: Low   Does use hearing aid 11/10/2017    Priority: Low   S/P lumbar laminectomy 03/24/2022   Thyroid nodule greater than or equal to 1.5 cm in diameter incidentally noted on imaging study 08/27/2021   Angina pectoris ([place]) 06/25/2021   Current Meds  Medication Sig   predniSONE (DELTASONE) 20 MG tablet Take 3 tabs daily for 5 days    Allergies: Patient is allergic to brilinta [ticagrelor], bextra [valdecoxib], hydrocod poli-chlorphe poli er, lipitor [atorvastatin], mevacor [lovastatin], plavix [clopidogrel bisulfate], zocor [simvastatin], doxycycline, metoprolol, morphine and related, codeine, and lisinopril. Family History: Patient  family history includes AAA (abdominal aortic aneurysm) in her brother; Breast cancer in her sister; Colon cancer in her cousin and paternal grandmother; Diabetes in her brother and sister; Heart attack in her brother, father, mother, and sister; Heart disease in her brother, brother, father, mother, and sister; Hyperlipidemia in her brother and sister; Hypertension in her brother and sister; Liver cancer in her sister. Social History:  Patient  reports that she quit smoking about 26 years ago. Her smoking use included cigarettes. She has never used smokeless tobacco. She reports current alcohol use of about 6.0 - 10.0 standard drinks of alcohol per week. She reports that she does not use drugs.  Review of Systems: Constitutional: Negative for fever malaise or anorexia Cardiovascular: negative for chest pain Respiratory: negative for SOB or persistent cough Gastrointestinal: negative for abdominal pain  Objective  Vitals: BP (!) 118/50   Pulse 81   Temp 98.2 F (36.8 C)   Ht 5' (1.524 m)   Wt 132 lb 6.4 oz (60.1 kg)   SpO2 94%   BMI 25.86 kg/m  General: no acute distress , A&Ox3, appears tired but nontoxic. No visible respiratory distress HEENT: PEERL, conjunctiva normal, neck is supple Cardiovascular:  RRR without murmur or gallop.  Respiratory:  Good breath sounds bilaterally, CTAB with normal respiratory effort no wheezing, rales nor rhonchi Skin:  Warm, no rashes    Commons side effects, risks, benefits, and alternatives for medications and treatment plan prescribed today were discussed, and the patient expressed understanding of the given instructions. Patient is instructed to call or message via MyChart if he/she has any questions or concerns regarding our treatment plan. No barriers to understanding were identified. We discussed Red Flag symptoms and signs in detail. Patient expressed understanding regarding what to do in case of urgent or emergency type symptoms.  Medication list  was reconciled, printed and provided to the patient in AVS. Patient instructions and summary information was reviewed with the patient as documented in the AVS. This note was prepared with assistance of Dragon voice recognition software. Occasional wrong-word or sound-a-like substitutions may have occurred due to the inherent limitations of voice recognition software  This visit occurred during the SARS-CoV-2 public health emergency.  Safety protocols were in place, including screening questions prior to the visit, additional usage of staff PPE, and extensive cleaning of exam room while observing appropriate contact time as indicated for disinfecting solutions.

## 2022-11-10 ENCOUNTER — Ambulatory Visit: Payer: Medicare Other | Admitting: Internal Medicine

## 2022-11-10 VITALS — BP 120/58 | HR 75 | Ht 60.0 in | Wt 134.0 lb

## 2022-11-10 DIAGNOSIS — E042 Nontoxic multinodular goiter: Secondary | ICD-10-CM | POA: Diagnosis not present

## 2022-11-10 LAB — TSH: TSH: 0.29 u[IU]/mL — ABNORMAL LOW (ref 0.35–5.50)

## 2022-11-10 LAB — T4, FREE: Free T4: 1.07 ng/dL (ref 0.60–1.60)

## 2022-11-10 NOTE — Progress Notes (Signed)
Name: Megan [name]  MRN/ DOB: [id number], [birth year]    Age/ Sex: 76 y.o., female    PCP: [name], MD   Reason for Endocrinology Evaluation: MNG     Date of Initial Endocrinology Evaluation: 11/10/2022     HPI: Megan [name] is a 76 y.o. female with a past medical history of PVD, MNG , COPD and CAD . The patient presented for initial endocrinology clinic visit on 11/10/2022 for consultative assistance with her MNG.   She had an incidental finding of a right thyroid nodule 2.1 cm on CT scan which promoted a thyroid ultrasound revealing MNG with a right inferior 2.5 nodule meeting FNA criteria.    She has been diagnosed with thyroid nodules  ~25 years ago . She has has at least two aspirations , the last one in her 61's. [facility] until 3 yrs ago  ( Dr. 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  She is getting over a URI   She Denies local neck enlargement  Weigh that increased  Denies palpitations  Has occasional diarrhea  Has mild tremors which is chronic  She is bruising easily  She also has noted increase facial hair  growth, no stubbles on exam today .  No Biotin intake    HISTORY:  Past Medical History:  Past Medical History:  Diagnosis Date   AAA (abdominal aortic aneurysm) ([place])    a. s/p stent grafting in 2009.   Adenomatous colon polyp 05/2001   Adenomatous duodenal polyp    Allergy    Anginal pain (HCC)    COPD (chronic obstructive pulmonary disease) ([place])    pt denies COPD   Coronary artery disease    a. s/p CABG in 1995;  b. 05/2013 Neg MV, EF 82%;  c. 06/2013 Cath: LM nl, LAD 40-50p, LCX nl, RCA 80p/1104m VG->RCA->PDA->PLSA min irregs, LIMA->LAD atretic, vigorous LV fxn.   Diabetes mellitus without complication  (HCC)    history of, resolved. 2017   Diverticulosis    Eczema, dyshidrotic    Eczema, dyshidrotic 1986   Dr. HMathis Fare  Emphysema of lung ([facility]    Fatty liver    Fibromyalgia    GERD (gastroesophageal reflux disease)    Hyperlipidemia    Hypertension    Migraine    Myocardial infarction ([facility]    Osteoarthritis    Osteoporosis    unsure, having a bone scan soon   Peripheral arterial disease ([place]    left leg diagnosed by Dr BMare Ferrari  Renal artery stenosis ([facility]    a. 2008 s/p PTA   Past Surgical History:  Past Surgical History:  Procedure Laterality Date   ABDOMINAL AORTAGRAM N/A 03/06/2014   Procedure: ABDOMINAL AORTAGRAM;  Surgeon: TRosetta Posner MD;  [facility];  Service: Cardiovascular;  Laterality: N/A;   ABDOMINAL AORTIC ANEURYSM REPAIR  2009   stenting   ABDOMINAL HYSTERECTOMY     Total, 1992   BREAST BIOPSY     CARDIAC CATHETERIZATION     CARDIOVASCULAR STRESS TEST  11/11/2009   normal study   CARPAL TUNNEL RELEASE  08/1993   left wrist   CARPAL TUNNEL RELEASE  01/1997  right   CATARACT EXTRACTION, BILATERAL  2021   CORONARY ARTERY BYPASS GRAFT  07/1994   Triple by Dr Ceasar Mons   CORONARY STENT INTERVENTION N/A 02/02/2021   Procedure: CORONARY STENT INTERVENTION;  Surgeon: Nelva Bush, MD;  Location: [place] CV LAB;  Service: Cardiovascular;  Laterality: N/A;   CORONARY STENT INTERVENTION N/A 06/26/2021   Procedure: CORONARY STENT INTERVENTION;  Surgeon: Sherren Mocha, MD;  Location: [place] CV LAB;  Service: Cardiovascular;  Laterality: N/A;   dental implants     EYE SURGERY  03/2020   cataract   HAND SURGERY Right 09/2002   Right hand pulley release   INTRAVASCULAR ULTRASOUND/IVUS N/A 06/26/2021   Procedure: Intravascular Ultrasound/IVUS;  Surgeon: Sherren Mocha, MD;  Location: [place] CV LAB;  Service: Cardiovascular;  Laterality: N/A;   INTRAVASCULAR ULTRASOUND/IVUS N/A 09/03/2021   Procedure: Intravascular  Ultrasound/IVUS;  Surgeon: Early Osmond, MD;  Location: [place] CV LAB;  Service: Cardiovascular;  Laterality: N/A;   LAMINECTOMY WITH POSTERIOR LATERAL ARTHRODESIS LEVEL 1 N/A 03/24/2022   Procedure: LAMINECTOMY, FORAMINOTOMY, LUMBAR TWO- THREE ; INTERTRANSVERSE FUSION LUMBAR TWO - LUMBAR THREE RIGHT;  Surgeon: Eustace Moore, MD;  Location: [facility];  Service: Neurosurgery;  Laterality: N/A;   LEFT HEART CATH AND CORONARY ANGIOGRAPHY N/A 06/26/2021   Procedure: LEFT HEART CATH AND CORONARY ANGIOGRAPHY;  Surgeon: Sherren Mocha, MD;  Location: [place] CV LAB;  Service: Cardiovascular;  Laterality: N/A;   LEFT HEART CATH AND CORS/GRAFTS ANGIOGRAPHY N/A 02/02/2021   Procedure: LEFT HEART CATH AND CORS/GRAFTS ANGIOGRAPHY;  Surgeon: Nelva Bush, MD;  Location: [place] CV LAB;  Service: Cardiovascular;  Laterality: N/A;   LEFT HEART CATH AND CORS/GRAFTS ANGIOGRAPHY N/A 09/03/2021   Procedure: LEFT HEART CATH AND CORS/GRAFTS ANGIOGRAPHY;  Surgeon: Early Osmond, MD;  Location: [place] CV LAB;  Service: Cardiovascular;  Laterality: N/A;   LEFT HEART CATH AND CORS/GRAFTS ANGIOGRAPHY N/A 07/15/2022   Procedure: LEFT HEART CATH AND CORS/GRAFTS ANGIOGRAPHY;  Surgeon: Martinique, Peter M, MD;  Location: [place] CV LAB;  Service: Cardiovascular;  Laterality: N/A;   LEFT HEART CATHETERIZATION WITH CORONARY/GRAFT ANGIOGRAM N/A 06/11/2013   Procedure: LEFT HEART CATHETERIZATION WITH [name];  Surgeon: Thayer Headings, MD;  [facility] LAB;  Service: Cardiovascular;  Laterality: N/A;   MULTIPLE TOOTH EXTRACTIONS  2000   All upper teeth.  Has full dneture.    RENAL ARTERY STENT  2008   SHOULDER ARTHROSCOPY WITH ROTATOR CUFF REPAIR Right 09/27/2018   Procedure: Right shoulder mini open rotator cuff repair;  Surgeon: Susa Day, MD;  Location: [facility] ORS;  Service: Orthopedics;  Laterality: Right;  90 mins   thyroid cyst apiration  05/1997 and 07/1998   TONSILECTOMY,  ADENOIDECTOMY, BILATERAL MYRINGOTOMY AND TUBES  1989   TONSILLECTOMY     TOTAL ABDOMINAL HYSTERECTOMY  1992    Social History:  reports that she quit smoking about 26 years ago. Her smoking use included cigarettes. She has never used smokeless tobacco. She reports current alcohol use of about 6.0 - 10.0 standard drinks of alcohol per week. She reports that she does not use drugs. Family History: family history includes AAA (abdominal aortic aneurysm) in her brother; Breast cancer in her sister; Colon cancer in her cousin and paternal grandmother; Diabetes in her brother and sister; Heart attack in her brother, father, mother, and sister; Heart disease in her brother, brother, father, mother, and sister; Hyperlipidemia in her brother and sister; Hypertension in her brother and sister;  Liver cancer in her sister.   HOME MEDICATIONS: Allergies as of 11/10/2022       Reactions   Brilinta [ticagrelor] Shortness Of Breath   Bextra [valdecoxib] Other (See Comments)   Increased lft's   Hydrocod Poli-chlorphe Poli Er Rash   Lipitor [atorvastatin] Other (See Comments)   Leg weakness   Mevacor [lovastatin] Other (See Comments)   Muscle weakness   Plavix [clopidogrel Bisulfate] Itching, Other (See Comments)   Hands and feet tingle and itch   Zocor [simvastatin] Other (See Comments)   Bones hurt   Doxycycline Diarrhea, Nausea And Vomiting   Metoprolol Other (See Comments)   Hair loss   Morphine And Related Itching, Nausea Only   Codeine Itching, Rash   Hyper-active   Lisinopril Cough        Medication List        Accurate as of November 10, 2022 10:53 AM. If you have any questions, ask your nurse or doctor.          Accu-Chek FastClix Lancets Misc Check blood sugar once daily PRN   Accu-Chek SmartView test strip Generic drug: glucose blood CHECK BLOOD SUGAR ONCE DAILY AS NEEDED   albuterol 108 (90 Base) MCG/ACT inhaler Commonly known as: VENTOLIN HFA Inhale 2 puffs into the  lungs every 6 (six) hours as needed for wheezing or shortness of breath.   ALPRAZolam 0.25 MG tablet Commonly known as: XANAX Take 1 tablet (0.25 mg total) by mouth at bedtime as needed for sleep. What changed:  when to take this reasons to take this   amLODipine 2.5 MG tablet Commonly known as: NORVASC Take 1 tablet (2.5 mg total) by mouth daily.   aspirin EC 81 MG tablet Take 1 tablet (81 mg total) by mouth daily.   atorvastatin 80 MG tablet Commonly known as: LIPITOR TAKE 1 TABLET BY MOUTH EVERY DAY   azithromycin 250 MG tablet Commonly known as: ZITHROMAX Take 2 tabs today, then 1 tab daily for 4 days   B-100 COMPLEX PO Take 1 tablet by mouth every evening.   bismuth subsalicylate 378 HY/85OY suspension Commonly known as: PEPTO BISMOL Take 30 mLs by mouth every 6 (six) hours as needed for indigestion or diarrhea or loose stools.   Breztri Aerosphere 160-9-4.8 MCG/ACT Aero Generic drug: Budeson-Glycopyrrol-Formoterol Inhale 2 puffs into the lungs in the morning and at bedtime.   cetirizine 10 MG tablet Commonly known as: ZYRTEC Take 10 mg by mouth daily as needed for allergies.   cholecalciferol 25 MCG (1000 UNIT) tablet Commonly known as: VITAMIN D3 Take 1,000 Units by mouth every evening.   Cordran 4 MCG/SQCM Tape Generic drug: Flurandrenolide Apply 1 each topically daily as needed (Rash).   cycloSPORINE 0.05 % ophthalmic emulsion Commonly known as: RESTASIS Place 1 drop into both eyes 2 (two) times daily.   ezetimibe 10 MG tablet Commonly known as: ZETIA Take 10 mg by mouth every evening.   gabapentin 300 MG capsule Commonly known as: NEURONTIN Take 1 capsule (300 mg total) by mouth 3 (three) times daily. What changed: when to take this   GAS-X PO Take 1-2 tablets by mouth daily as needed (gas).   guaiFENesin-codeine 100-10 MG/5ML syrup Take 5 mLs by mouth every 6 (six) hours as needed for cough.   hydrochlorothiazide 12.5 MG capsule Commonly  known as: MICROZIDE Take 1 capsule (12.5 mg total) by mouth daily.   isosorbide mononitrate 30 MG 24 hr tablet Commonly known as: IMDUR Take 3 tablets ( 90 mg )  every morning and 1 tablet (30 mg ) every evening What changed:  how much to take how to take this when to take this additional instructions   losartan 100 MG tablet Commonly known as: COZAAR Take 1 tablet (100 mg total) by mouth daily.   metoprolol succinate 25 MG 24 hr tablet Commonly known as: Toprol XL Take 1 tablet (25 mg total) by mouth daily.   multivitamin with minerals tablet Take 1 tablet by mouth every evening.   nitroGLYCERIN 0.4 MG SL tablet Commonly known as: NITROSTAT Place 1 tablet (0.4 mg total) under the tongue every 5 (five) minutes as needed for chest pain.   omeprazole 40 MG capsule Commonly known as: PRILOSEC Take 1 capsule (40 mg total) by mouth in the morning and at bedtime.   oxyCODONE-acetaminophen 10-325 MG tablet Commonly known as: PERCOCET Take 1 tablet by mouth in the morning and at bedtime. Can take one tablet three times a day as needed for back pain   polyethylene glycol 17 g packet Commonly known as: MIRALAX / GLYCOLAX Take 17 g by mouth daily as needed for moderate constipation.   prasugrel 5 MG Tabs tablet Commonly known as: EFFIENT TAKE 1 TABLET (5 MG TOTAL) BY MOUTH DAILY.   predniSONE 20 MG tablet Commonly known as: DELTASONE Take 3 tabs daily for 5 days   PRESCRIPTION MEDICATION Place 2 puffs into both ears daily as needed (ear infections). [facility]SA[facility] ear powder   tretinoin 0.1 % cream Commonly known as: RETIN-A Apply 1 application. topically every evening.          REVIEW OF SYSTEMS: A comprehensive ROS was conducted with the patient and is negative except as per HPI    OBJECTIVE:  VS: There were no vitals taken for this visit.   Wt Readings from Last 3 Encounters:  11/08/22 132 lb 6.4 oz (60.1 kg)  08/13/22 131 lb (59.4 kg)  07/30/22 127 lb 3.2 oz (57.7  kg)     EXAM: General: Pt appears well and is in NAD  Neck: General: Supple without adenopathy. Thyroid: Right thyroid nodule appreciated and left inferior nodule appreciated  R> L   Lungs: Clear with good BS bilat with no rales, rhonchi, or wheezes  Heart: Auscultation: RRR.  Abdomen: Normoactive bowel sounds, soft, nontender, without masses or organomegaly palpable  Extremities:  BL LE: No pretibial edema normal ROM and strength.  Skin: Hair: Texture and amount normal with gender appropriate distribution Skin Inspection: No rashes Skin Palpation: Skin temperature, texture, and thickness normal to palpation  Mental Status: Judgment, insight: Intact Orientation: Oriented to time, place, and person Mood and affect: No depression, anxiety, or agitation     DATA REVIEWED:     Latest Reference Range & Units 11/09/21 10:40  TSH 0.35 - 5.50 uIU/mL 1.62  T4,Free(Direct) 0.60 - 1.60 ng/dL 0.85      Thyroid Ultrasound 08/26/2021 Estimated total number of nodules >/= 1 cm: 5   Number of spongiform nodules >/=  2 cm not described below (TR1): 0   Number of mixed cystic and solid nodules >/= 1.5 cm not described below ([place]): 0   _________________________________________________________   Nodule # 1:   Location: Isthmus; right   Maximum size: 1.3 cm; Other 2 dimensions: 1.2 x 0.7 cm   Composition: solid/almost completely solid (2)   Echogenicity: isoechoic (1)   Shape: not taller-than-wide (0)   Margins: ill-defined (0)   Echogenic foci: peripheral calcifications (2)   ACR TI-RADS total points: 5.  ACR TI-RADS risk category: TR4 (4-6 points).   ACR TI-RADS recommendations:   *Given size (>/= 1 - 1.4 cm) and appearance, a follow-up ultrasound in 1 year should be considered based on TI-RADS criteria.   _________________________________________________________   Nodule # 3:   Location: Right ; Inferior   Maximum size: 2.5 cm; Other 2 dimensions: 2.5 x 2.1 cm    Composition: solid/almost completely solid (2)   Echogenicity: isoechoic (1)   Shape: not taller-than-wide (0)   Margins: ill-defined (0)   Echogenic foci: punctate echogenic foci (3)   ACR TI-RADS total points: 6.   ACR TI-RADS risk category: TR4 (4-6 points).   ACR TI-RADS recommendations:   **Given size (>/= 1.5 cm) and appearance, fine needle aspiration of this moderately suspicious nodule should be considered based on TI-RADS criteria.   _________________________________________________________   There are 3 additional left thyroid cystic and isoechoic nodules noted, all measuring 1.3 cm or less. These would not meet criteria for any follow-up or biopsy are not fully described by TI rads criteria.   Nonspecific thyroid heterogeneity. No hypervascularity. No regional adenopathy.   IMPRESSION: 2.5 cm left inferior thyroid TR 4 nodule meets criteria for biopsy as above. This correlates with the CTA finding.  FNA right inferior 2.5 cm nodule 12/09/2021  Clinical History: Right inferior, 2.5 cm; Other 2 dimensions: 2.5 x 2.1  cm, Solid/almost completely solid, Isoechoic, TI-RADS total points: 6  Specimen Submitted:  A. THYROID, RIGHT INFERIOR NODULE #3, FINE NEEDLE  ASPIRATION:    FINAL MICROSCOPIC DIAGNOSIS:  - Atypia of undetermined significance (Bethesda category III)    Afirma Benign   ASSESSMENT/PLAN/RECOMMENDATIONS:   Multinodular Goiter:   - NO local neck symptoms  - She is S/P  FNA of the right inferior 2.5 cm nodule in 12/2021 , with cytology report consistent with Bethesda category III.  Afirma was benign   2. Increased facial hair growth :  - No evidence of hirsutism or stubbles today - Questions lanugo - Pt to use topical hair removal remedies  - At this age , the risk outweighs the benefits of any hormonal treatment    F/ in 1 yr    Signed electronically by: Mack Guise, MD  [facility] Endocrin[facility]  [address] [place], [place] [address] Phone: [phone] FAX: [phone]   CC: [name], [address] [place] [address] Phone: [phone] Fax: [phone]   Return to Endocrinology clinic as below: Future Appointments  Date Time Provider [place]  11/10/2022 11:10 AM Una Yeomans, Melanie Crazier, MD [facility] None  12/14/2022 10:30 AM Hunsucker, Bonna Gains, MD [facility] None  12/22/2022 10:30 AM [name], MD [facility]  01/31/2023 10:00 AM Martinique, Peter M, MD [facility] None

## 2022-11-11 ENCOUNTER — Ambulatory Visit: Payer: Medicare Other | Admitting: Cardiology

## 2022-11-11 ENCOUNTER — Telehealth: Payer: Self-pay | Admitting: Internal Medicine

## 2022-11-11 NOTE — Addendum Note (Signed)
Addended by: Dorita Sciara on: 11/11/2022 08:57 AM   Modules accepted: Orders

## 2022-11-11 NOTE — Telephone Encounter (Signed)
Detailed vm left for patient and also mychart  message sent

## 2022-11-11 NOTE — Telephone Encounter (Signed)
Please let the patient know that her thyroid test shows she has slight increase in thyroid hormone production   This could be caused by the prednisone that she is taking from bronchitis  Please make sure the patient is not on any biotin   Please schedule patient for repeat labs in 6 weeks    Thanks

## 2022-11-15 ENCOUNTER — Other Ambulatory Visit: Payer: Self-pay | Admitting: Family Medicine

## 2022-11-15 DIAGNOSIS — R109 Unspecified abdominal pain: Secondary | ICD-10-CM | POA: Diagnosis not present

## 2022-11-15 DIAGNOSIS — Z9889 Other specified postprocedural states: Secondary | ICD-10-CM | POA: Diagnosis not present

## 2022-11-15 DIAGNOSIS — Z1231 Encounter for screening mammogram for malignant neoplasm of breast: Secondary | ICD-10-CM

## 2022-11-15 DIAGNOSIS — M7061 Trochanteric bursitis, right hip: Secondary | ICD-10-CM | POA: Diagnosis not present

## 2022-11-18 DIAGNOSIS — M25561 Pain in right knee: Secondary | ICD-10-CM | POA: Diagnosis not present

## 2022-11-18 DIAGNOSIS — M2241 Chondromalacia patellae, right knee: Secondary | ICD-10-CM | POA: Diagnosis not present

## 2022-11-22 ENCOUNTER — Ambulatory Visit
Admission: RE | Admit: 2022-11-22 | Discharge: 2022-11-22 | Disposition: A | Discharge status: Home / Self Care | Payer: Medicare Other | Source: Ambulatory Visit | Attending: Internal Medicine | Admitting: Internal Medicine

## 2022-11-22 DIAGNOSIS — E042 Nontoxic multinodular goiter: Secondary | ICD-10-CM

## 2022-11-22 DIAGNOSIS — E041 Nontoxic single thyroid nodule: Secondary | ICD-10-CM | POA: Diagnosis not present

## 2022-12-01 DIAGNOSIS — R109 Unspecified abdominal pain: Secondary | ICD-10-CM | POA: Diagnosis not present

## 2022-12-02 DIAGNOSIS — D692 Other nonthrombocytopenic purpura: Secondary | ICD-10-CM | POA: Diagnosis not present

## 2022-12-13 ENCOUNTER — Ambulatory Visit: Payer: Medicare Other | Attending: Neurological Surgery | Admitting: Physical Therapy

## 2022-12-13 DIAGNOSIS — R2681 Unsteadiness on feet: Secondary | ICD-10-CM | POA: Insufficient documentation

## 2022-12-13 DIAGNOSIS — R42 Dizziness and giddiness: Secondary | ICD-10-CM | POA: Insufficient documentation

## 2022-12-13 DIAGNOSIS — R278 Other lack of coordination: Secondary | ICD-10-CM | POA: Diagnosis not present

## 2022-12-13 DIAGNOSIS — M6281 Muscle weakness (generalized): Secondary | ICD-10-CM | POA: Insufficient documentation

## 2022-12-13 DIAGNOSIS — R2689 Other abnormalities of gait and mobility: Secondary | ICD-10-CM | POA: Diagnosis not present

## 2022-12-13 NOTE — Therapy (Signed)
OUTPATIENT PHYSICAL THERAPY NEURO EVALUATION   Patient Name: Megan [name] MRN: [id number] DOB:Nov 24, 1946, 77 y.o., female 49 Date: 12/13/2022   PCP: [name], MD REFERRING PROVIDER: Eustace Moore, MD  END OF SESSION:  PT End of Session - 12/13/22 1100     Visit Number 1    Number of Visits 13   with eval   Date for PT Re-Evaluation 02/07/23    Authorization Type [employer] Medicare    PT Start Time 1058    PT Stop Time 1152    PT Time Calculation (min) 54 min    Equipment Utilized During Treatment Gait belt    Activity Tolerance Patient tolerated treatment well    Behavior During Therapy WFL for tasks assessed/performed             Past Medical History:  Diagnosis Date   AAA (abdominal aortic aneurysm) ([place])    a. s/p stent grafting in 2009.   Adenomatous colon polyp 05/2001   Adenomatous duodenal polyp    Allergy    Anginal pain (HCC)    COPD (chronic obstructive pulmonary disease) ([place])    pt denies COPD   Coronary artery disease    a. s/p CABG in 1995;  b. 05/2013 Neg MV, EF 82%;  c. 06/2013 Cath: LM nl, LAD 40-50p, LCX nl, RCA 80p/111m VG->RCA->PDA->PLSA min irregs, LIMA->LAD atretic, vigorous LV fxn.   Diabetes mellitus without complication (HCC)    history of, resolved. 2017   Diverticulosis    Eczema, dyshidrotic    Eczema, dyshidrotic 1986   Dr. HMathis Fare  Emphysema of lung ([facility]    Fatty liver    Fibromyalgia    GERD (gastroesophageal reflux disease)    Hyperlipidemia    Hypertension    Migraine    Myocardial infarction ([facility]    Osteoarthritis    Osteoporosis    unsure, having a bone scan soon   Peripheral arterial disease ([place]    left leg diagnosed by Dr BMare Ferrari  Renal artery stenosis ([facility]    a. 2008 s/p PTA   Past Surgical History:  Procedure Laterality Date   ABDOMINAL AORTAGRAM N/A 03/06/2014   Procedure: ABDOMINAL AORTAGRAM;  Surgeon: TRosetta Posner MD;  [facility];  Service: Cardiovascular;  Laterality: N/A;    ABDOMINAL AORTIC ANEURYSM REPAIR  2009   stenting   ABDOMINAL HYSTERECTOMY     Total, 1992   BREAST BIOPSY     CARDIAC CATHETERIZATION     CARDIOVASCULAR STRESS TEST  11/11/2009   normal study   CARPAL TUNNEL RELEASE  08/1993   left wrist   CARPAL TUNNEL RELEASE  01/1997   right   CATARACT EXTRACTION, BILATERAL  2021   CORONARY ARTERY BYPASS GRAFT  07/1994   Triple by Dr ECeasar Mons  CORONARY STENT INTERVENTION N/A 02/02/2021   Procedure: CORONARY STENT INTERVENTION;  Surgeon: ENelva Bush MD;  [place];  Service: Cardiovascular;  Laterality: N/A;   CORONARY STENT INTERVENTION N/A 06/26/2021   Procedure: CORONARY STENT INTERVENTION;  Surgeon: CSherren Mocha MD;  [place];  Service: Cardiovascular;  Laterality: N/A;   dental implants     EYE SURGERY  03/2020   cataract   HAND SURGERY Right 09/2002   Right hand pulley release   INTRAVASCULAR ULTRASOUND/IVUS N/A 06/26/2021   Procedure: Intravascular Ultrasound/IVUS;  Surgeon: CSherren Mocha MD;  [place] [facility];  Service: Cardiovascular;  Laterality: N/A;   INTRAVASCULAR  ULTRASOUND/IVUS N/A 09/03/2021   Procedure: Intravascular Ultrasound/IVUS;  Surgeon: Early Osmond, MD;  Location: [place] CV LAB;  Service: Cardiovascular;  Laterality: N/A;   LAMINECTOMY WITH POSTERIOR LATERAL ARTHRODESIS LEVEL 1 N/A 03/24/2022   Procedure: LAMINECTOMY, FORAMINOTOMY, LUMBAR TWO- THREE ; INTERTRANSVERSE FUSION LUMBAR TWO - LUMBAR THREE RIGHT;  Surgeon: Eustace Moore, MD;  Location: [place];  Service: Neurosurgery;  Laterality: N/A;   LEFT HEART CATH AND CORONARY ANGIOGRAPHY N/A 06/26/2021   Procedure: LEFT HEART CATH AND CORONARY ANGIOGRAPHY;  Surgeon: Sherren Mocha, MD;  Location: [place] CV LAB;  Service: Cardiovascular;  Laterality: N/A;   LEFT HEART CATH AND CORS/GRAFTS ANGIOGRAPHY N/A 02/02/2021   Procedure: LEFT HEART CATH AND CORS/GRAFTS ANGIOGRAPHY;  Surgeon: Nelva Bush, MD;   Location: [place] CV LAB;  Service: Cardiovascular;  Laterality: N/A;   LEFT HEART CATH AND CORS/GRAFTS ANGIOGRAPHY N/A 09/03/2021   Procedure: LEFT HEART CATH AND CORS/GRAFTS ANGIOGRAPHY;  Surgeon: Early Osmond, MD;  Location: [place] CV LAB;  Service: Cardiovascular;  Laterality: N/A;   LEFT HEART CATH AND CORS/GRAFTS ANGIOGRAPHY N/A 07/15/2022   Procedure: LEFT HEART CATH AND CORS/GRAFTS ANGIOGRAPHY;  Surgeon: Martinique, Peter M, MD;  Location: [place] CV LAB;  Service: Cardiovascular;  Laterality: N/A;   LEFT HEART CATHETERIZATION WITH CORONARY/GRAFT ANGIOGRAM N/A 06/11/2013   Procedure: LEFT HEART CATHETERIZATION WITH [name];  Surgeon: Thayer Headings, MD;  Location: [facility] CATH LAB;  Service: Cardiovascular;  Laterality: N/A;   MULTIPLE TOOTH EXTRACTIONS  2000   All upper teeth.  Has full dneture.    RENAL ARTERY STENT  2008   SHOULDER ARTHROSCOPY WITH ROTATOR CUFF REPAIR Right 09/27/2018   Procedure: Right shoulder mini open rotator cuff repair;  Surgeon: Susa Day, MD;  Location: [facility];  Service: Orthopedics;  Laterality: Right;  90 mins   thyroid cyst apiration  05/1997 and 07/1998   TONSILECTOMY, ADENOIDECTOMY, BILATERAL MYRINGOTOMY AND TUBES  1989   TONSILLECTOMY     TOTAL ABDOMINAL HYSTERECTOMY  1992   Patient Active Problem List   Diagnosis Date Noted   S/P lumbar laminectomy 03/24/2022   Thyroid nodule greater than or equal to 1.5 cm in diameter incidentally noted on imaging study 08/27/2021   Angina pectoris ([place]) 06/25/2021   History of compression fracture of spine - lumbar 06/19/2021   History of lumbosacral spine surgery 03/03/2021   NSTEMI (non-ST elevated myocardial infarction) ([place]) 2022 01/31/2021   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/26/2021   Neuropathic pain 12/05/2020   Gait instability 09/09/2020   Lumbar stenosis without neurogenic claudication 02/26/2020   COPD with chronic bronchitis 09/28/2018   Essential (primary)  hypertension 07/10/2018   IFG (impaired fasting glucose) 11/10/2017   DJD (degenerative joint disease), lumbosacral 11/10/2017   Fibromyalgia 11/10/2017   Dyshidrotic eczema 11/10/2017   GERD (gastroesophageal reflux disease) 11/10/2017   Osteopenia 11/10/2017   Does use hearing aid 11/10/2017   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   Fatty liver 11/10/2017   Coronary artery disease    Peripheral vascular disease with claudication ([place])    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   History of abdominal aortic aneurysm repair 05/18/2011   History of renal artery stenosis 05/18/2011   Hyperlipidemia LDL goal <70     ONSET DATE: 12/07/2022  REFERRING DIAG: M48.061 (ICD-10-CM) - Spinal stenosis, lumbar region without neurogenic claudication  THERAPY DIAG:  Unsteadiness on feet  Other abnormalities of gait and mobility  Muscle weakness (generalized)  Other lack of coordination  Dizziness and giddiness  Rationale for Evaluation and Treatment: Rehabilitation  SUBJECTIVE:                                                                                                                                                                                             SUBJECTIVE STATEMENT: Pt reports she has had balance issues in the past but believes that her balance has progressively been getting worse since her most recent back surgery in April of 2023. Pt reports was only using her SPC intermittently but now feels like she needs it all the time. Pt also reports getting a cold in November with a lingering cough and her balance has been worse since getting sick. Pt also feels that her core muscles and her leg muscles are weak (has difficulty standing up at times and feels like her legs may give out on the stairs). Pt reports a history of issues with her vestibular system that she has received PT services for in the past. Pt reports her most recent fall occurred when she was sitting in a chair at her dining room  table and reached out for a crumb with her RLE, her LLE then gave out and she fell to the floor. Pt reports she bruised her ribs on the L side but had no fractures with this fall. Pt also reports she recently started taking a new inhaler Kristen Loader) with a side effect of dizziness and tremors, going to discuss side effects with her Pulmonary MD tomorrow. Pt reports she was able to walk a mile to a mile a half daily before getting sick.   Pt accompanied by: self  PERTINENT HISTORY: PVD, MNG,COPD and CAD;  Lumbar Spinal Surgery 03/24/2022 1.  Decompressive lumbar laminectomy, medial facetectomy and foraminotomies L2-3 bilaterally, 2.  Right intertransverse arthrodesis L2-3 utilizing locally harvested morselized autologous bone graft and morselized allograft  PAIN:  Are you having pain? Yes: NPRS scale: not rated/10 Pain location: B hips due to bursitis Pain description: constant (chronic pain) Aggravating factors: N/A Relieving factors: pain medication  PRECAUTIONS: Fall  WEIGHT BEARING RESTRICTIONS: No  FALLS: Has patient fallen in last 6 months? Yes. Number of falls one when she fell out of chair, see Subjective    LIVING ENVIRONMENT: Lives with: lives alone (husband has PSP, lives at [place] in memory care unit) Lives in: House/apartment Stairs: No (lives in apartment, Media planner) Has following equipment at home: Single point cane and Environmental consultant - 2 wheeled  PLOF: Independent with gait, Independent with transfers, and Requires assistive device for independence  PATIENT GOALS: "improve my balance and increase my core strength"  OBJECTIVE:   DIAGNOSTIC FINDINGS: None  recent or relevant to this POC  COGNITION: Overall cognitive status: Within functional limits for tasks assessed   SENSATION: Decreased in distal RLE where veins taken for triple bypass Light touch sensation appears intact  POSTURE: rounded shoulders and forward head  LOWER EXTREMITY MMT:    MMT Right Eval Left Eval   Hip flexion 4 5  Hip extension    Hip abduction    Hip adduction    Hip internal rotation    Hip external rotation    Knee flexion 5 5  Knee extension 5 5  Ankle dorsiflexion 5 5  Ankle plantarflexion    Ankle inversion    Ankle eversion    (Blank rows = not tested)  BED MOBILITY:  Has to use a stool to get into bed due to height  TRANSFERS: Assistive device utilized: None  Sit to stand: Modified independence Stand to sit: Modified independence Chair to chair: Modified independence Floor:  not assessed at eval  GAIT: Gait pattern:  multidirectional path deviation Distance walked: various clinic distances Assistive device utilized: None Level of assistance: Modified independence Comments: path deviation   FUNCTIONAL TESTS:   MCTSIB: Condition 1: Avg of 3 trials: 30 sec, Condition 2: Avg of 3 trials: 30 sec, Condition 3: Avg of 3 trials: 30 sec, Condition 4: Avg of 3 trials: 9.6 sec, and Total Score: 99.6/120   [facility] PT Assessment - 12/13/22 1121       Ambulation/Gait   Gait velocity 32.8 ft over 10.91 sec = 3.0 ft/sec      Standardized Balance Assessment   Standardized Balance Assessment Timed Up and Go Test;Five Times Sit to Stand    Five times sit to stand comments  11.56   heavy UE reliance on arms of chair     Timed Up and Go Test   TUG Normal TUG    Normal TUG (seconds) 9.59      Functional Gait  Assessment   Gait assessed  Yes    Gait Level Surface Walks 20 ft in less than 7 sec but greater than 5.5 sec, uses assistive device, slower speed, mild gait deviations, or deviates 6-10 in outside of the 12 in walkway width.    Change in Gait Speed Makes only minor adjustments to walking speed, or accomplishes a change in speed with significant gait deviations, deviates 10-15 in outside the 12 in walkway width, or changes speed but loses balance but is able to recover and continue walking.    Gait with Horizontal Head Turns Performs head turns with moderate changes  in gait velocity, slows down, deviates 10-15 in outside 12 in walkway width but recovers, can continue to walk.    Gait with Vertical Head Turns Performs task with slight change in gait velocity (eg, minor disruption to smooth gait path), deviates 6 - 10 in outside 12 in walkway width or uses assistive device    Gait and Pivot Turn Pivot turns safely within 3 sec and stops quickly with no loss of balance.    Step Over Obstacle Is able to step over one shoe box (4.5 in total height) but must slow down and adjust steps to clear box safely. May require verbal cueing.    Gait with Narrow Base of Support Ambulates 7-9 steps.    Gait with Eyes Closed Walks 20 ft, uses assistive device, slower speed, mild gait deviations, deviates 6-10 in outside 12 in walkway width. Ambulates 20 ft in less than 9 sec but greater than 7  sec.    Ambulating Backwards Walks 20 ft, uses assistive device, slower speed, mild gait deviations, deviates 6-10 in outside 12 in walkway width.    Steps Alternating feet, must use rail.    Total Score 18    FGA comment: 18/30, high fall risk             TODAY'S TREATMENT:                                                                                                                              PT Evaluation    PATIENT EDUCATION: Education details: Eval findings, PT POC Person educated: Patient Education method: Customer service manager Education comprehension: verbalized understanding, returned demonstration, and needs further education  HOME EXERCISE PROGRAM: To be initiated next session  GOALS: Goals reviewed with patient? Yes  SHORT TERM GOALS: Target date: 01/03/2023    Pt will be independent with initial HEP for improved strength, balance, transfers and gait. Baseline: Goal status: INITIAL  2.  Pt will improve FGA to 22/30 for decreased fall risk  Baseline: 18/30 (1/8) Goal status: INITIAL  3.  Pt to improve time on Condition 4 of mCTSIB by >/= 5 sec  to demonstrate improved balance and decreased fall risk Baseline: 9.6 sec (1/8) Goal status: INITIAL   LONG TERM GOALS: Target date: 01/27/2023   Pt will be independent with final HEP for improved strength, balance, transfers and gait and be able to state plan for exercise post-discharge in a community setting Baseline:  Goal status: INITIAL  2.  Pt will improve FGA to 24/30 for decreased fall risk  Baseline: 18/30 (1/8) Goal status: INITIAL  3.  Pt to improve time on Condition 4 of mCTSIB by >/= 10 sec to demonstrate improved balance and decreased fall risk Baseline: 9.6 sec (1/8) Goal status: INITIAL  4.  Pt will improve gait velocity to at least 3.5 ft/sec for improved gait efficiency and performance at mod I level  Baseline: 3.0 ft/sec with no AD (1/8) Goal status: INITIAL  ASSESSMENT:  CLINICAL IMPRESSION: Patient is a 77 year old female referred to Neuro OPPT for lumbar spinal stenosis without neurogenic claudication and self-reported balance impairments.   Pt's PMH is significant for: PVD, MNG,COPD and CAD. The following deficits were present during the exam: decreased RLE strength, decreased gait speed, decreased balance. Based on her gait speed of 3.0 ft/sec, path deviation during gait, fall history, FGA score of 18/30, and time on Condition 4 of the mCTSIB of 9.6 seconds pt is an increased risk for falls. Pt would benefit from skilled PT to address these impairments and functional limitations to maximize functional mobility independence.   OBJECTIVE IMPAIRMENTS: Abnormal gait, decreased balance, decreased strength, dizziness, and impaired perceived functional ability.   ACTIVITY LIMITATIONS: carrying, lifting, bending, squatting, stairs, and bed mobility  PARTICIPATION LIMITATIONS: meal prep, driving, and community activity  PERSONAL FACTORS: Age, Time since onset of injury/illness/exacerbation, and 1-2 comorbidities:  PVD, MNG,COPD and CAD are also affecting patient's  functional outcome.   REHAB POTENTIAL: Good  CLINICAL DECISION MAKING: Stable/uncomplicated  EVALUATION COMPLEXITY: Low  PLAN:  PT FREQUENCY: 2x/week  PT DURATION: 6 weeks  PLANNED INTERVENTIONS: Therapeutic exercises, Therapeutic activity, Neuromuscular re-education, Balance training, Gait training, Patient/Family education, Self Care, Joint mobilization, Stair training, Vestibular training, Canalith repositioning, Visual/preceptual remediation/compensation, DME instructions, Dry Needling, Cryotherapy, Moist heat, Taping, Manual therapy, and Re-evaluation  PLAN FOR NEXT SESSION: further assess vestibular system to determine if this currently affecting her balance, balance based on deficits from FGA (horizontal head turns, tandem gait, stepping over obstacles), RLE strengthening, wanting to know what sort of equipment to use at the gym upon d/c from PT services   Excell Seltzer, PT, DPT, CSRS 12/13/2022, 2:26 PM

## 2022-12-14 ENCOUNTER — Encounter: Payer: Self-pay | Admitting: Pulmonary Disease

## 2022-12-14 ENCOUNTER — Ambulatory Visit: Payer: Medicare Other | Admitting: Pulmonary Disease

## 2022-12-14 VITALS — BP 134/64 | HR 81 | Wt 136.6 lb

## 2022-12-14 DIAGNOSIS — J432 Centrilobular emphysema: Secondary | ICD-10-CM | POA: Diagnosis not present

## 2022-12-14 DIAGNOSIS — R0609 Other forms of dyspnea: Secondary | ICD-10-CM | POA: Diagnosis not present

## 2022-12-14 MED ORDER — STIOLTO RESPIMAT 2.5-2.5 MCG/ACT IN AERS: 2.0000 | INHALATION_SPRAY | Freq: Every day | RESPIRATORY_TRACT | 3 refills | Status: DC

## 2022-12-14 MED ORDER — ALBUTEROL SULFATE HFA 108 (90 BASE) MCG/ACT IN AERS: 2.0000 | INHALATION_SPRAY | Freq: Four times a day (QID) | RESPIRATORY_TRACT | 11 refills | Status: DC | PRN

## 2022-12-14 NOTE — Progress Notes (Signed)
$'@Patient'B$  ID: Megan [name], female    DOB: 10-02-1946, 77 y.o.   MRN: [id number]  Chief Complaint  Patient presents with   Follow-up    Pt is her for follow up for DOE. Pt states that [name] is causing a lot of issues with her legs. But her breathing is ok but its causing swelling in her legs. Over thanksgiving she had a cough and tested negative for covid and was placed on ABT and it got worse and then was placed on prednisone and she wants to talk about getting on different inhaler.     Referring provider: [name], MD  HPI:   77 y.o. whom are seeing in follow-up for evaluation of dyspnea on exertion related to emphysema and likely cardiac causes.  Most recent PCP note reviewed.  Returns for routine follow-up.  At last visit she felt breast he was giving her some benefit in terms of her breathing.  She still feels this way.  However, she is complaining of bruising or purpuric lesions on extremities.  Suspect is likely due to her dual antiplatelet therapy and thin skin but she is convinced it is related to the steroid.  I informed her she is on an inhaled corticosteroid is unlikely because significant side effects.  However, she would like to try something different to see if it helps with the skin changes.  We reviewed at length her serial CT scans 06/24/2021 and 07/25/2022 shows similar degree of 3 significant emphysema on my review and interpretation,  HPI at initial visit: She was in her usual state of health.  She developed onset of shortness of breath a few weeks ago.  Different than baseline dyspnea.  Recurrent shorter distances.  Preceded with aches, sounds like viral infection.  Still some heart ache as well which is concerning to her given her early CAD and significant coronary history.  This prompted presentation to the ED.  There CTA PE protocol was negative for PE but showed presevere emphysematous changes with predilection for the upper lobes otherwise clear on my review  and interpretation.  She subsequent underwent a left heart catheterization showed stable coronary artery disease and prior PCI and CABG.  This was reassuring.  In the interim she was placed on Breztri.  She flexes helped some with her shortness of breath.  Able to do more.  Still get a little bit winded on longer walks etc.  She used to walk a mile and a half a day.  She is working back to getting to that distance.  PMH: Tobacco abuse in remission, hypertension, CAD, asthma, hyperlipidemia, seasonal allergies Surgical history: Back surgery, CABG, PCI Family history: Mother and father with CAD Social history: Former smoker, quit 1997, lives in [place] / Pulmonary Flowsheets:   ACT:      No data to display           MMRC:     No data to display           Epworth:      No data to display           Tests:   FENO:  No results found for: "NITRICOXIDE"  PFT:     No data to display           WALK:      No data to display           Imaging: Personally reviewed and as per EMR discussion in this  note US THYROID  Result Date: 11/23/2022 CLINICAL DATA:  Prior ultrasound follow-up. History of prior biopsy of right sided thyroid nodule in January of 2023 EXAM: THYROID ULTRASOUND TECHNIQUE: Ultrasound examination of the thyroid gland and adjacent soft tissues was performed. COMPARISON:  Prior thyroid ultrasound 08/26/2021 FINDINGS: Parenchymal Echotexture: Moderately heterogenous Isthmus: 0.7 cm Right lobe: 4.6 x 1.9 x 2.9 cm Left lobe: 4.4 x 1.4 x 1.1 cm _________________________________________________________ Estimated total number of nodules >/= 1 cm: 4 Number of spongiform nodules >/=  2 cm not described below (TR1): 0 Number of mixed cystic and solid nodules >/= 1.5 cm not described below (TR2): 0 _________________________________________________________ Nodule # 2: Location: Right; Superior Maximum size: 1.2 cm; Other 2 dimensions: 1.1 x  1.1 cm Composition: solid/almost completely solid (2) Echogenicity: hypoechoic (2) Shape: not taller-than-wide (0) Margins: smooth (0) Echogenic foci: none (0) ACR TI-RADS total points: 4. ACR TI-RADS risk category: TR4 (4-6 points). ACR TI-RADS recommendations: *Given size (>/= 1 - 1.4 cm) and appearance, a follow-up ultrasound in 1 year should be considered based on TI-RADS criteria. _________________________________________________________ Nodule # 3: Previously biopsied nodule in the right mid gland is essentially unchanged at 2.5 x 2.4 x 2.3 cm compared to 2.6 x 2.5 x 2.1 cm previously. Nodule # 4: Simple cyst in the left upper gland measures up to 1.6 cm. This is considered sonographically benign and does not warrant further evaluation. Nodule # 5: Subcentimeter nodule in the left mid gland. No further follow-up. Nodule # 6: Cluster of small minimally complex cysts in the left inferior gland. No further follow-up. IMPRESSION: 1. No interval change in the size or appearance of previously biopsied nodule in the right mid gland. 2. Approximately 1.2 cm TI-RADS category 4 nodule in the right upper gland meets criteria for imaging surveillance. Recommend follow-up ultrasound in 1 year. 3. No new nodules or suspicious features. The above is in keeping with the ACR TI-RADS recommendations - J Am Coll Radiol 2017;14:587-595. Electronically Signed   By: Jacqulynn Cadet M.D.   On: 11/23/2022 15:12    Lab Results: Personally reviewed CBC    Component Value Date/Time   WBC 9.8 07/11/2022 1912   RBC 4.68 07/11/2022 1912   HGB 13.8 07/11/2022 1912   HGB 15.2 06/29/2022 1538   HCT 40.7 07/11/2022 1912   HCT 45.3 06/29/2022 1538   PLT 221 07/11/2022 1912   PLT 272 06/29/2022 1538   MCV 87.0 07/11/2022 1912   MCV 89 06/29/2022 1538   MCH 29.5 07/11/2022 1912   MCHC 33.9 07/11/2022 1912   RDW 13.0 07/11/2022 1912   RDW 12.4 06/29/2022 1538   LYMPHSABS 2.0 01/06/2022 0954   MONOABS 0.5 03/16/2021 1347    EOSABS 0.2 01/06/2022 0954   BASOSABS 0.0 01/06/2022 0954    BMET    Component Value Date/Time   NA 140 07/11/2022 1912   NA 143 06/29/2022 1538   K 3.6 07/11/2022 1912   CL 108 07/11/2022 1912   CO2 24 07/11/2022 1912   GLUCOSE 109 (H) 07/11/2022 1912   BUN 10 07/11/2022 1912   BUN 7 (L) 06/29/2022 1538   CREATININE 0.74 07/11/2022 1912   CREATININE 0.71 06/29/2016 1057   CALCIUM 9.2 07/11/2022 1912   GFRNONAA >60 07/11/2022 1912   GFRAA 89 01/01/2020 0835    BNP    Component Value Date/Time   BNP 94.2 06/29/2022 1538   BNP 862.1 (H) 01/08/2021 1630    ProBNP No results found for: "PROBNP"  Specialty Problems  Pulmonary Problems   COPD with chronic bronchitis    Allergies  Allergen Reactions   Brilinta [Ticagrelor] Shortness Of Breath   Bextra [Valdecoxib] Other (See Comments)    Increased lft's    Hydrocod Poli-Chlorphe Poli Er Rash   Lipitor [Atorvastatin] Other (See Comments)    Leg weakness    Mevacor [Lovastatin] Other (See Comments)    Muscle weakness    Plavix [Clopidogrel Bisulfate] Itching and Other (See Comments)    Hands and feet tingle and itch   Zocor [Simvastatin] Other (See Comments)    Bones hurt    Doxycycline Diarrhea and Nausea And Vomiting   Metoprolol Other (See Comments)    Hair loss   Morphine And Related Itching and Nausea Only   Codeine Itching and Rash    Hyper-active   Lisinopril Cough    Immunization History  Administered Date(s) Administered   Fluad Quad(high Dose 65+) 07/30/2019, 08/10/2022   Influenza, High Dose Seasonal PF 08/13/2017, 08/26/2018, 09/15/2020   Influenza, Seasonal, Injecte, Preservative Fre 09/21/2014, 09/06/2015, 07/21/2016   Influenza,inj,quad, With Preservative 08/06/2017   Influenza-Unspecified 08/10/2017, 10/13/2021   PFIZER(Purple Top)SARS-COV-2 Vaccination 12/22/2019, 01/12/2020, 09/15/2020   PPD Test 09/13/1979, 03/10/2021   Pfizer Covid-19 Vaccine Bivalent Booster 30yr & up  10/13/2021   Pneumococcal Conjugate-13 12/31/2016   Pneumococcal Polysaccharide-23 12/16/2014, 08/01/2019   Respiratory Syncytial Virus Vaccine,Recomb Aduvanted(Arexvy) 10/06/2022   Td 05/13/2008   Td (Adult), 2 Lf Tetanus Toxid, Preservative Free 05/13/2008   Tdap 08/09/2012   Zoster Recombinat (Shingrix) 01/13/2018, 03/14/2018   Zoster, Live 11/03/2010    Past Medical History:  Diagnosis Date   AAA (abdominal aortic aneurysm) ([facility]    a. s/p stent grafting in 2009.   Adenomatous colon polyp 05/2001   Adenomatous duodenal polyp    Allergy    Anginal pain (HCC)    COPD (chronic obstructive pulmonary disease) ([facility]    pt denies COPD   Coronary artery disease    a. s/p CABG in 1995;  b. 05/2013 Neg MV, EF 82%;  c. 06/2013 Cath: LM nl, LAD 40-50p, LCX nl, RCA 80p/1063mVG->RCA->PDA->PLSA min irregs, LIMA->LAD atretic, vigorous LV fxn.   Diabetes mellitus without complication (HCC)    history of, resolved. 2017   Diverticulosis    Eczema, dyshidrotic    Eczema, dyshidrotic 1986   Dr. HuMathis Fare [facility]   Fatty liver    Fibromyalgia    GERD (gastroesophageal reflux disease)    Hyperlipidemia    Hypertension    Migraine    [facility]   Osteoarthritis    Osteoporosis    unsure, having a bone scan soon   Peripheral arterial disease ([facility]   left leg diagnosed by Dr BrMare Ferrari [facility]   a. 2008 s/p PTA    Tobacco History: Social History   Tobacco Use  Smoking Status Former   Packs/day: 0.00   Years: 37.00   Total pack years: 0.00   Types: Cigarettes   Quit date: 12/07/1995   Years since quitting: 27.0  Smokeless Tobacco Never   Counseling given: Not Answered   Continue to not smoke  Outpatient Encounter Medications as of 12/14/2022  Medication Sig   Accu-Chek FastClix Lancets MISC Check blood sugar once daily PRN   albuterol (PROAIR HFA) 108 (90 Base) MCG/ACT inhaler Inhale 2 puffs into the lungs every 6  (six) hours as needed for wheezing or shortness of breath.   ALPRAZolam (XDuanne Moron  0.25 MG tablet Take 1 tablet (0.25 mg total) by mouth at bedtime as needed for sleep. (Patient taking differently: Take 0.25 mg by mouth daily as needed for sleep or anxiety.)   amLODipine (NORVASC) 2.5 MG tablet Take 1 tablet (2.5 mg total) by mouth daily.   aspirin EC 81 MG tablet Take 1 tablet (81 mg total) by mouth daily.   atorvastatin (LIPITOR) 80 MG tablet TAKE 1 TABLET BY MOUTH EVERY DAY   B Complex-Biotin-FA (B-100 COMPLEX PO) Take 1 tablet by mouth every evening.   bismuth subsalicylate (PEPTO BISMOL) 262 MG/15ML suspension Take 30 mLs by mouth every 6 (six) hours as needed for indigestion or diarrhea or loose stools.   cetirizine (ZYRTEC) 10 MG tablet Take 10 mg by mouth daily as needed for allergies.   cholecalciferol (VITAMIN D3) 25 MCG (1000 UNIT) tablet Take 1,000 Units by mouth every evening.   CORDRAN 4 MCG/SQCM TAPE Apply 1 each topically daily as needed (Rash).    cycloSPORINE (RESTASIS) 0.05 % ophthalmic emulsion Place 1 drop into both eyes 2 (two) times daily.    ezetimibe (ZETIA) 10 MG tablet Take 10 mg by mouth every evening.   gabapentin (NEURONTIN) 300 MG capsule Take 1 capsule (300 mg total) by mouth 3 (three) times daily. (Patient taking differently: Take 300 mg by mouth 2 (two) times daily.)   glucose blood (ACCU-CHEK SMARTVIEW) test strip CHECK BLOOD SUGAR ONCE DAILY AS NEEDED   hydrochlorothiazide (MICROZIDE) 12.5 MG capsule Take 1 capsule (12.5 mg total) by mouth daily.   isosorbide mononitrate (IMDUR) 30 MG 24 hr tablet Take 3 tablets ( 90 mg ) every morning and 1 tablet (30 mg ) every evening (Patient taking differently: Take 30-90 mg by mouth See admin instructions. Take 3 tablets ( 90 mg ) every morning and 1 or 2 tablets (30 - 60 mg ) every evening)   losartan (COZAAR) 100 MG tablet Take 1 tablet (100 mg total) by mouth daily.   metoprolol succinate (TOPROL XL) 25 MG 24 hr tablet Take  1 tablet (25 mg total) by mouth daily.   Multiple Vitamins-Minerals (MULTIVITAMIN WITH MINERALS) tablet Take 1 tablet by mouth every evening.   nitroGLYCERIN (NITROSTAT) 0.4 MG SL tablet Place 1 tablet (0.4 mg total) under the tongue every 5 (five) minutes as needed for chest pain.   omeprazole (PRILOSEC) 40 MG capsule Take 1 capsule (40 mg total) by mouth in the morning and at bedtime.   oxyCODONE-acetaminophen (PERCOCET) 10-325 MG tablet Take 1 tablet by mouth in the morning and at bedtime. Can take one tablet three times a day as needed for back pain   polyethylene glycol (MIRALAX / GLYCOLAX) 17 g packet Take 17 g by mouth daily as needed for moderate constipation.   prasugrel (EFFIENT) 5 MG TABS tablet TAKE 1 TABLET (5 MG TOTAL) BY MOUTH DAILY.   PRESCRIPTION MEDICATION Place 2 puffs into both ears daily as needed (ear infections). CSAHC ear powder   Simethicone (GAS-X PO) Take 1-2 tablets by mouth daily as needed (gas).   Tiotropium Bromide-Olodaterol (STIOLTO RESPIMAT) 2.5-2.5 MCG/ACT AERS Inhale 2 puffs into the lungs daily.   tretinoin (RETIN-A) 0.1 % cream Apply 1 application. topically every evening.   [DISCONTINUED] albuterol (VENTOLIN HFA) 108 (90 Base) MCG/ACT inhaler Inhale 2 puffs into the lungs every 6 (six) hours as needed for wheezing or shortness of breath.   [DISCONTINUED] azithromycin (ZITHROMAX) 250 MG tablet Take 2 tabs today, then 1 tab daily for 4 days   [  DISCONTINUED] Budeson-Glycopyrrol-Formoterol (BREZTRI AEROSPHERE) 160-9-4.8 MCG/ACT AERO Inhale 2 puffs into the lungs in the morning and at bedtime.   [DISCONTINUED] guaiFENesin-codeine 100-10 MG/5ML syrup Take 5 mLs by mouth every 6 (six) hours as needed for cough.   [DISCONTINUED] predniSONE (DELTASONE) 20 MG tablet Take 3 tabs daily for 5 days   No facility-administered encounter medications on file as of 12/14/2022.     Review of Systems  Review of Systems  N/a Physical Exam  BP 134/64 (BP Location: Left Arm,  Patient Position: Sitting, Cuff Size: Normal)   Pulse 81   Wt 136 lb 9.6 oz (62 kg)   SpO2 96%   BMI 26.68 kg/m   Wt Readings from Last 5 Encounters:  12/14/22 136 lb 9.6 oz (62 kg)  11/10/22 134 lb (60.8 kg)  11/08/22 132 lb 6.4 oz (60.1 kg)  08/13/22 131 lb (59.4 kg)  07/30/22 127 lb 3.2 oz (57.7 kg)    BMI Readings from Last 5 Encounters:  12/14/22 26.68 kg/m  11/10/22 26.17 kg/m  11/08/22 25.86 kg/m  08/13/22 25.58 kg/m  07/30/22 24.84 kg/m     Physical Exam General: Well-appearing, sitting in chair Eyes: EOMI, no icterus Neck: Supple, no JVP Pulmonary: Clear, normal work of breathing Cardiovascular warm, no edema Abdomen: Nondistended, bowel sounds present MSK: No synovitis, no joint effusion Neuro: Normal gait, no weakness Psych: Normal mood, full affect   Assessment & Plan:   Dyspnea on exertion: Likely multifactorial related to cardiac issues, worsening diastology with exercise given significant left atrial dilation.  Also suspect large contribution from emphysema.  Severe emphysema on imaging.  Interval improvement on Breztri.  Given her aversion to steroids, skin condition that likely is unrelated, stopping Breztri.  Trial Stiolto.  Emphysema: Severe on imaging.  Change Breztri to [employer] to avoid ICS per patient request.   Return in about 3 months (around 03/15/2023).   Lanier Clam, MD 12/14/2022

## 2022-12-14 NOTE — Patient Instructions (Addendum)
Nice to see you again  Stop the [facility], this contains an inhaled steroid but may be contributing to the purpura  Use Stiolto 2 puffs once a day.  Please let me know if the breathing worsens after about 4 weeks to a month.  If the breathing worsens and the skin is unchanged, we can always go back to the [place].  I am hopeful the Stiolto will still provide good benefit and we will not need to go back.  I refilled your albuterol rescue inhaler  Return to clinic in 3 months or sooner if needed with Dr. Silas Flood

## 2022-12-21 ENCOUNTER — Ambulatory Visit: Payer: Medicare Other

## 2022-12-21 DIAGNOSIS — R42 Dizziness and giddiness: Secondary | ICD-10-CM

## 2022-12-21 DIAGNOSIS — R2689 Other abnormalities of gait and mobility: Secondary | ICD-10-CM

## 2022-12-21 DIAGNOSIS — M6281 Muscle weakness (generalized): Secondary | ICD-10-CM | POA: Diagnosis not present

## 2022-12-21 DIAGNOSIS — R2681 Unsteadiness on feet: Secondary | ICD-10-CM | POA: Diagnosis not present

## 2022-12-21 DIAGNOSIS — R278 Other lack of coordination: Secondary | ICD-10-CM | POA: Diagnosis not present

## 2022-12-21 NOTE — Therapy (Signed)
OUTPATIENT PHYSICAL THERAPY NEURO TREATMENT   Patient Name: Megan [name] MRN: [id number] DOB:[birth year], 77 y.o., female 77 Date: 12/21/2022   PCP: [name], MD REFERRING PROVIDER: Eustace Moore, MD  END OF SESSION:  PT End of Session - 12/21/22 1015     Visit Number 2    Number of Visits 13    Date for PT Re-Evaluation 02/07/23    Authorization Type [employer] Medicare    PT Start Time 1015    PT Stop Time 1100    PT Time Calculation (min) 45 min    Activity Tolerance Patient tolerated treatment well    Behavior During Therapy WFL for tasks assessed/performed             Past Medical History:  Diagnosis Date   AAA (abdominal aortic aneurysm) ([place])    a. s/p stent grafting in 2009.   Adenomatous colon polyp 05/2001   Adenomatous duodenal polyp    Allergy    Anginal pain (HCC)    COPD (chronic obstructive pulmonary disease) ([place])    pt denies COPD   Coronary artery disease    a. s/p CABG in 1995;  b. 05/2013 Neg MV, EF 82%;  c. 06/2013 Cath: LM nl, LAD 40-50p, LCX nl, RCA 80p/143m VG->RCA->PDA->PLSA min irregs, LIMA->LAD atretic, vigorous LV fxn.   Diabetes mellitus without complication (HCC)    history of, resolved. 2017   Diverticulosis    Eczema, dyshidrotic    Eczema, dyshidrotic 1986   Dr. HMathis Fare  Emphysema of lung ([facility]    Fatty liver    Fibromyalgia    GERD (gastroesophageal reflux disease)    Hyperlipidemia    Hypertension    Migraine    Myocardial infarction ([facility]    Osteoarthritis    Osteoporosis    unsure, having a bone scan soon   Peripheral arterial disease ([place]    left leg diagnosed by Dr BMare Ferrari  Renal artery stenosis ([facility]    a. 2008 s/p PTA   Past Surgical History:  Procedure Laterality Date   ABDOMINAL AORTAGRAM N/A 03/06/2014   Procedure: ABDOMINAL AORTAGRAM;  Surgeon: TRosetta Posner MD;  [facility];  Service: Cardiovascular;  Laterality: N/A;   ABDOMINAL AORTIC ANEURYSM REPAIR  2009   stenting   ABDOMINAL  HYSTERECTOMY     Total, 1992   BREAST BIOPSY     CARDIAC CATHETERIZATION     CARDIOVASCULAR STRESS TEST  11/11/2009   normal study   CARPAL TUNNEL RELEASE  08/1993   left wrist   CARPAL TUNNEL RELEASE  01/1997   right   CATARACT EXTRACTION, BILATERAL  2021   CORONARY ARTERY BYPASS GRAFT  07/1994   Triple by Dr ECeasar Mons  CORONARY STENT INTERVENTION N/A 02/02/2021   Procedure: CORONARY STENT INTERVENTION;  Surgeon: ENelva Bush MD;  [facility];  Service: Cardiovascular;  Laterality: N/A;   CORONARY STENT INTERVENTION N/A 06/26/2021   Procedure: CORONARY STENT INTERVENTION;  Surgeon: CSherren Mocha MD;  [place];  Service: Cardiovascular;  Laterality: N/A;   dental implants     EYE SURGERY  03/2020   cataract   HAND SURGERY Right 09/2002   Right hand pulley release   INTRAVASCULAR ULTRASOUND/IVUS N/A 06/26/2021   Procedure: Intravascular Ultrasound/IVUS;  Surgeon: CSherren Mocha MD;  [place];  Service: Cardiovascular;  Laterality: N/A;   INTRAVASCULAR ULTRASOUND/IVUS N/A 09/03/2021   Procedure: Intravascular Ultrasound/IVUS;  Surgeon: TLenna Sciara  K, MD;  Location: [place] CV LAB;  Service: Cardiovascular;  Laterality: N/A;   LAMINECTOMY WITH POSTERIOR LATERAL ARTHRODESIS LEVEL 1 N/A 03/24/2022   Procedure: LAMINECTOMY, FORAMINOTOMY, LUMBAR TWO- THREE ; INTERTRANSVERSE FUSION LUMBAR TWO - LUMBAR THREE RIGHT;  Surgeon: Eustace Moore, MD;  Location: [place];  Service: Neurosurgery;  Laterality: N/A;   LEFT HEART CATH AND CORONARY ANGIOGRAPHY N/A 06/26/2021   Procedure: LEFT HEART CATH AND CORONARY ANGIOGRAPHY;  Surgeon: Sherren Mocha, MD;  Location: [place] CV LAB;  Service: Cardiovascular;  Laterality: N/A;   LEFT HEART CATH AND CORS/GRAFTS ANGIOGRAPHY N/A 02/02/2021   Procedure: LEFT HEART CATH AND CORS/GRAFTS ANGIOGRAPHY;  Surgeon: Nelva Bush, MD;  Location: [place] CV LAB;  Service: Cardiovascular;   Laterality: N/A;   LEFT HEART CATH AND CORS/GRAFTS ANGIOGRAPHY N/A 09/03/2021   Procedure: LEFT HEART CATH AND CORS/GRAFTS ANGIOGRAPHY;  Surgeon: Early Osmond, MD;  Location: [place] [facility] LAB;  Service: Cardiovascular;  Laterality: N/A;   LEFT HEART CATH AND CORS/GRAFTS ANGIOGRAPHY N/A 07/15/2022   Procedure: LEFT HEART CATH AND CORS/GRAFTS ANGIOGRAPHY;  Surgeon: Martinique, Peter M, MD;  Location: [place] CV LAB;  Service: Cardiovascular;  Laterality: N/A;   LEFT HEART CATHETERIZATION WITH CORONARY/GRAFT ANGIOGRAM N/A 06/11/2013   Procedure: LEFT HEART CATHETERIZATION WITH [name];  Surgeon: Thayer Headings, MD;  Location[facility] C[facility] LAB;  Service: Cardiovascular;  Laterality: N/A;   MULTIPLE TOOTH EXTRACTIONS  2000   All upper teeth.  Has full dneture.    RENAL ARTERY STENT  2008   SHOULDER ARTHROSCOPY WITH ROTATOR CUFF REPAIR Right 09/27/2018   Procedure: Right shoulder mini open rotator cuff repair;  Surgeon: Susa Day, MD;  Location: [facility];  Service: Orthopedics;  Laterality: Right;  90 mins   thyroid cyst apiration  05/1997 and 07/1998   TONSILECTOMY, ADENOIDECTOMY, BILATERAL MYRINGOTOMY AND TUBES  1989   TONSILLECTOMY     TOTAL ABDOMINAL HYSTERECTOMY  1992   Patient Active Problem List   Diagnosis Date Noted   S/P lumbar laminectomy 03/24/2022   Thyroid nodule greater than or equal to 1.5 cm in diameter incidentally noted on imaging study 08/27/2021   Angina pectoris ([place]) 06/25/2021   History of compression fracture of spine - lumbar 06/19/2021   History of lumbosacral spine surgery 03/03/2021   NSTEMI (non-ST elevated myocardial infarction) ([place]) 2022 01/31/2021   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/26/2021   Neuropathic pain 12/05/2020   Gait instability 09/09/2020   Lumbar stenosis without neurogenic claudication 02/26/2020   COPD with chronic bronchitis 09/28/2018   Essential (primary) hypertension 07/10/2018   IFG (impaired fasting glucose)  11/10/2017   DJD (degenerative joint disease), lumbosacral 11/10/2017   Fibromyalgia 11/10/2017   Dyshidrotic eczema 11/10/2017   GERD (gastroesophageal reflux disease) 11/10/2017   Osteopenia 11/10/2017   Does use hearing aid 11/10/2017   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   Fatty liver 11/10/2017   Coronary artery disease    Peripheral vascular disease with claudication ([place])    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   History of abdominal aortic aneurysm repair 05/18/2011   History of renal artery stenosis 05/18/2011   Hyperlipidemia LDL goal <70     ONSET DATE: 12/07/2022  REFERRING DIAG: M48.061 (ICD-10-CM) - Spinal stenosis, lumbar region without neurogenic claudication  THERAPY DIAG:  Unsteadiness on feet  Other abnormalities of gait and mobility  Muscle weakness (generalized)  Other lack of coordination  Dizziness and giddiness  Rationale for Evaluation and Treatment: Rehabilitation  SUBJECTIVE:  SUBJECTIVE STATEMENT: Patient reports doing well. Did break a toe on Friday catching a table leg- didn't fall.    Pt accompanied by: self  PERTINENT HISTORY: PVD, MNG,COPD and CAD;  Lumbar Spinal Surgery 03/24/2022 1.  Decompressive lumbar laminectomy, medial facetectomy and foraminotomies L2-3 bilaterally, 2.  Right intertransverse arthrodesis L2-3 utilizing locally harvested morselized autologous bone graft and morselized allograft  PAIN:  Are you having pain? Yes: NPRS scale: 1/10 Pain location: B hips due to bursitis Pain description: constant (chronic pain) Aggravating factors: N/A Relieving factors: pain medication  PRECAUTIONS: Fall  PATIENT GOALS: "improve my balance and increase my core strength"   TODAY'S TREATMENT:                                                                                                                               Vestibular Asssessment   General Observation: NAD    Symptom Behavior:   Subjective history: Patient reports feeling unbalanced, not necessarily a "spinning" sensation, but more wobbly when turning. She had a bad cold the week after this past Thanksgiving, on abx and steroids for a week and balance got worse- doesn't remember what type of abx. Does report some spinning when first waking up.    Non-Vestibular symptoms:  none   Type of dizziness: Spinning/Vertigo, Unsteady with head/body turns, and like she's drunk    Frequency: somewhat constant, but worse when she turns. Spinning vertigo is only in the AM, but sporadically   Duration: as long as she's doing the aggravating factor    Aggravating factors: Induced by position change: lying supine, Induced by motion: bending down to the ground, turning body quickly, and turning head quickly, Worse in the morning, and Moving eyes   Relieving factors: head stationary, rest, and slow movements   Progression of symptoms: worse   Oculomotor Exam:   Ocular Alignment: normal   Ocular ROM: No Limitations   Spontaneous Nystagmus: absent   Gaze-Induced Nystagmus: absent   Smooth Pursuits: intact   Saccades: intact   Convergence/Divergence: <5 cm     Vestibular-Ocular Reflex (VOR):   Slow VOR: Normal   VOR Cancellation: Normal   Head-Impulse Test: HIT Right: positive HIT Left: positive   Dynamic Visual Acuity: Static: 11 Dynamic: 7    Positional Testing:  R posterior canal canalithiasis; upbeating R torsional nystagmus lasting ~8s with ~5s latency     Motion Sensitivity:   Motion Sensitivity Quotient  Intensity: 0 = none, 1 = Lightheaded, 2 = Mild, 3 = Moderate, 4 = Severe, 5 = Vomiting Duration: < 5 s = 0, 5-10s = 1,11-30s = 2, >30s = 3 Score = Intensity + duration     Intensity Duration Score  1. Sitting to supine 2.5    2. Supine to L side 1    3. Supine to R side 0    4.  Supine to sitting 2    5. L Hallpike-Dix 0    6.  Up from L  1    7. R Hallpike-Dix 3    8. Up from R  4    9. Sitting, head  tipped to L knee 0    10. Head up from L  knee 2    11. Sitting, head  tipped to R knee 0    12. Head up from R  knee 2    13. Sitting head turns x5 1    14.Sitting head nods x5 3    15. In stance, 180  turn to L  0    16. In stance, 180  turn to R 1      Vestibular Treatment   Canalith Repositioning:   Epley Right: Number of Reps: 1 and Response to Treatment: symptoms improved      PATIENT EDUCATION: Education details: exam findings Person educated: Patient Education method: Customer service manager Education comprehension: verbalized understanding, returned demonstration, and needs further education  HOME EXERCISE PROGRAM: To be initiated next session  GOALS: Goals reviewed with patient? Yes  SHORT TERM GOALS: Target date: 01/03/2023    Pt will be independent with initial HEP for improved strength, balance, transfers and gait. Baseline: Goal status: INITIAL  2.  Pt will improve FGA to 22/30 for decreased fall risk  Baseline: 18/30 (1/8) Goal status: INITIAL  3.  Pt to improve time on Condition 4 of mCTSIB by >/= 5 sec to demonstrate improved balance and decreased fall risk Baseline: 9.6 sec (1/8) Goal status: INITIAL   LONG TERM GOALS: Target date: 01/27/2023   Pt will be independent with final HEP for improved strength, balance, transfers and gait and be able to state plan for exercise post-discharge in a community setting Baseline:  Goal status: INITIAL  2.  Pt will improve FGA to 24/30 for decreased fall risk  Baseline: 18/30 (1/8) Goal status: INITIAL  3.  Pt to improve time on Condition 4 of mCTSIB by >/= 10 sec to demonstrate improved balance and decreased fall risk Baseline: 9.6 sec (1/8) Goal status: INITIAL  4.  Pt will improve gait velocity to at least 3.5 ft/sec for improved gait efficiency and performance  at mod I level  Baseline: 3.0 ft/sec with no AD (1/8) Goal status: INITIAL  ASSESSMENT:  CLINICAL IMPRESSION: Patient seen for skilled PT session with emphasis on vestibular assessment. She presents with some motion sensitivity- primarily with head turns and return to upright. She does have a previous history of BPPV, but did not specify when or which ear, but states it was treated by "jerking her around." Though patients symptom report did not match BPPV, she did have a positive dix hallpike for R posterior canal canalithiasis. Treated by Epley x1. Noted to have some nystagmus in 2nd position, but unable to determine true direction or length of time for this nystagmus as patient with looking around. To be rechecked at next appt due to time constraints. Continue POC.    OBJECTIVE IMPAIRMENTS: Abnormal gait, decreased balance, decreased strength, dizziness, and impaired perceived functional ability.   ACTIVITY LIMITATIONS: carrying, lifting, bending, squatting, stairs, and bed mobility  PARTICIPATION LIMITATIONS: meal prep, driving, and community activity  PERSONAL FACTORS: Age, Time since onset of injury/illness/exacerbation, and 1-2 comorbidities:    PVD, MNG,COPD and CAD are also affecting patient's functional outcome.   REHAB POTENTIAL: Good  CLINICAL DECISION MAKING: Stable/uncomplicated  EVALUATION COMPLEXITY: Low  PLAN:  PT FREQUENCY: 2x/week  PT DURATION: 6 weeks  PLANNED INTERVENTIONS: Therapeutic exercises, Therapeutic activity, Neuromuscular re-education,  Balance training, Gait training, Patient/Family education, Self Care, Joint mobilization, Stair training, Vestibular training, Canalith repositioning, Visual/preceptual remediation/compensation, DME instructions, Dry Needling, Cryotherapy, Moist heat, Taping, Manual therapy, and Re-evaluation  PLAN FOR NEXT SESSION: recheck R BPPV, started to give HEP for habituation but held pending resolution of BPPV (sit > sidelying,  bending down, head turns, VOR) RLE strengthening, wanting to know what sort of equipment to use at the gym upon d/c from [place], PT, DPT, CBIS  12/21/2022, 11:10 AM

## 2022-12-22 ENCOUNTER — Ambulatory Visit (INDEPENDENT_AMBULATORY_CARE_PROVIDER_SITE_OTHER): Payer: Medicare Other | Admitting: Family Medicine

## 2022-12-22 ENCOUNTER — Encounter: Payer: Self-pay | Admitting: Family Medicine

## 2022-12-22 VITALS — BP 146/84 | HR 73 | Temp 97.8°F | Ht 60.0 in | Wt 136.6 lb

## 2022-12-22 DIAGNOSIS — R7301 Impaired fasting glucose: Secondary | ICD-10-CM

## 2022-12-22 DIAGNOSIS — J4489 Other specified chronic obstructive pulmonary disease: Secondary | ICD-10-CM

## 2022-12-22 DIAGNOSIS — I209 Angina pectoris, unspecified: Secondary | ICD-10-CM | POA: Diagnosis not present

## 2022-12-22 DIAGNOSIS — I214 Non-ST elevation (NSTEMI) myocardial infarction: Secondary | ICD-10-CM

## 2022-12-22 DIAGNOSIS — Z789 Other specified health status: Secondary | ICD-10-CM | POA: Diagnosis not present

## 2022-12-22 DIAGNOSIS — I10 Essential (primary) hypertension: Secondary | ICD-10-CM | POA: Diagnosis not present

## 2022-12-22 DIAGNOSIS — E785 Hyperlipidemia, unspecified: Secondary | ICD-10-CM

## 2022-12-22 DIAGNOSIS — D692 Other nonthrombocytopenic purpura: Secondary | ICD-10-CM | POA: Insufficient documentation

## 2022-12-22 LAB — COMPREHENSIVE METABOLIC PANEL
ALT: 22 U/L (ref 0–35)
AST: 22 U/L (ref 0–37)
Albumin: 4 g/dL (ref 3.5–5.2)
Alkaline Phosphatase: 54 U/L (ref 39–117)
BUN: 9 mg/dL (ref 6–23)
CO2: 29 mEq/L (ref 19–32)
Calcium: 9.6 mg/dL (ref 8.4–10.5)
Chloride: 104 mEq/L (ref 96–112)
Creatinine, Ser: 0.69 mg/dL (ref 0.40–1.20)
GFR: 83.18 mL/min (ref >60.00)
Glucose, Bld: 103 mg/dL — ABNORMAL HIGH (ref 70–99)
Potassium: 3.8 mEq/L (ref 3.5–5.1)
Sodium: 142 mEq/L (ref 135–145)
Total Bilirubin: 0.9 mg/dL (ref 0.2–1.2)
Total Protein: 6 g/dL (ref 6.0–8.3)

## 2022-12-22 LAB — CBC WITH DIFFERENTIAL/PLATELET
Basophils Absolute: 0.1 10*3/uL (ref 0.0–0.1)
Basophils Relative: 0.5 % (ref 0.0–3.0)
Eosinophils Absolute: 0.2 10*3/uL (ref 0.0–0.7)
Eosinophils Relative: 1.5 % (ref 0.0–5.0)
HCT: 41 % (ref 36.0–46.0)
Hemoglobin: 14.3 g/dL (ref 12.0–15.0)
Lymphocytes Relative: 23.6 % (ref 12.0–46.0)
Lymphs Abs: 2.5 10*3/uL (ref 0.7–4.0)
MCHC: 34.9 g/dL (ref 30.0–36.0)
MCV: 91.1 fl (ref 78.0–100.0)
Monocytes Absolute: 0.6 10*3/uL (ref 0.1–1.0)
Monocytes Relative: 5.8 % (ref 3.0–12.0)
Neutro Abs: 7.2 10*3/uL (ref 1.4–7.7)
Neutrophils Relative %: 68.6 % (ref 43.0–77.0)
Platelets: 233 10*3/uL (ref 150.0–400.0)
RBC: 4.5 Mil/uL (ref 3.87–5.11)
RDW: 14.1 % (ref 11.5–15.5)
WBC: 10.5 10*3/uL (ref 4.0–10.5)

## 2022-12-22 LAB — POCT GLYCOSYLATED HEMOGLOBIN (HGB A1C): Hemoglobin A1C: 5.7 % — AB (ref 4.0–5.6)

## 2022-12-22 LAB — HEMOGLOBIN A1C: Hgb A1c MFr Bld: 6.1 % (ref 4.6–6.5)

## 2022-12-22 LAB — LIPID PANEL
Cholesterol: 148 mg/dL (ref 0–200)
HDL: 63.6 mg/dL (ref >39.00)
LDL Cholesterol: 58 mg/dL (ref 0–99)
NonHDL: 84.08
Total CHOL/HDL Ratio: 2
Triglycerides: 130 mg/dL (ref 0.0–149.0)
VLDL: 26 mg/dL (ref 0.0–40.0)

## 2022-12-22 LAB — TSH: TSH: 1.76 u[IU]/mL (ref 0.35–5.50)

## 2022-12-22 NOTE — Patient Instructions (Signed)
Please return in 6 months for your annual complete physical; please come fasting.   I will send your lab results to Dr. Martinique.   If you have any questions or concerns, please don't hesitate to send me a message via MyChart or call the office at [phone]. Thank you for visiting with [place] today! It's our pleasure caring for you.

## 2022-12-22 NOTE — Progress Notes (Signed)
Subjective  CC:  Chief Complaint  Patient presents with   Hypertension   Hyperlipidemia    HPI: Megan [name] is a 77 y.o. female who presents to the office today to address the problems listed above in the chief complaint. Hypertension f/u: Control is fair . Pt reports she is doing well. Seeing cards who manages her cardiovascular meds and bp meds. Home bp 130s-140s/70s-80s. Doing well overall. Less angina. She denies adverse effects from his BP medications. Compliance with medication is good.  Reviewed pulm notes: copd: trying steroid free inhaler. Senile purpura: unhappy with appearance but understands that there are no great treatments.  HLD: due for recheck for cardiology review.  IFG: no sxs of hyperglycemia. Weight is up. Does report daily alcohol intake: was drinking sweet mixed drinks but now beer and wine, 2-4 drinks/day, longterm.   Assessment  1. Essential (primary) hypertension   2. IFG (impaired fasting glucose)   3. COPD with chronic bronchitis   4. NSTEMI (non-ST elevated myocardial infarction) ([place]) 2022   5. Angina pectoris ([place])   6. Hyperlipidemia LDL goal <70   7. Senile purpura ([place])   8. Daily consumption of alcohol      Plan   Hypertension f/u: BP control is fairly well controlled. Will see cards next month who can adjust medications if warranted.  CAD: stable with medical management.  IFG: remains stable. Watch sugar/sweetened beverage intake. Rec decreasing alcohol consumption Recheck fasting lipids and lfts.  Senile purpura.  Copd: on stiolto now.   Education regarding management of these chronic disease states was given. Management strategies discussed on successive visits include dietary and exercise recommendations, goals of achieving and maintaining IBW, and lifestyle modifications aiming for adequate sleep and minimizing stressors.   Follow up: No follow-ups on file.  Orders Placed This Encounter  Procedures   Comprehensive metabolic panel    CBC with Differential/Platelet   Lipid panel   TSH   HgB A1c   POCT HgB A1C   No orders of the defined types were placed in this encounter.     BP Readings from Last 3 Encounters:  12/22/22 (!) 146/84  12/14/22 134/64  11/10/22 (!) 120/58   Wt Readings from Last 3 Encounters:  12/22/22 136 lb 9.6 oz (62 kg)  12/14/22 136 lb 9.6 oz (62 kg)  11/10/22 134 lb (60.8 kg)    Lab Results  Component Value Date   CHOL 142 04/05/2022   CHOL 148 01/06/2022   CHOL 142 06/25/2021   Lab Results  Component Value Date   HDL 51 04/05/2022   HDL 52 01/06/2022   HDL 49 06/25/2021   Lab Results  Component Value Date   LDLCALC 57 04/05/2022   LDLCALC 74 01/06/2022   LDLCALC 73 06/25/2021   Lab Results  Component Value Date   TRIG 209 (H) 04/05/2022   TRIG 125 01/06/2022   TRIG 100 06/25/2021   Lab Results  Component Value Date   CHOLHDL 2.8 04/05/2022   CHOLHDL 2.8 01/06/2022   CHOLHDL 2.9 06/25/2021   Lab Results  Component Value Date   LDLDIRECT 82.0 06/13/2020   LDLDIRECT 122.0 01/25/2019   LDLDIRECT 87.4 12/09/2014   Lab Results  Component Value Date   CREATININE 0.74 07/11/2022   BUN 10 07/11/2022   NA 140 07/11/2022   K 3.6 07/11/2022   CL 108 07/11/2022   CO2 24 07/11/2022    The ASCVD Risk score (Arnett DK, et al., 2019) failed to calculate for  the following reasons:   The patient has a prior MI or stroke diagnosis  I reviewed the patients updated PMH, FH, and SocHx.    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    Priority: High   Coronary artery disease     Priority: High   Peripheral vascular disease with claudication ([place])      Priority: High   History of abdominal aortic aneurysm repair 05/18/2011    Priority: High   Hyperlipidemia LDL goal <70     Priority: High   History of compression fracture of spine - lumbar 06/19/2021    Priority: Medium    History of lumbosacral spine surgery 03/03/2021    Priority: Medium    Neuropathic pain 12/05/2020    Priority: Medium    Gait instability 09/09/2020    Priority: Medium    Lumbar stenosis without neurogenic claudication 02/26/2020    Priority: Medium    DJD (degenerative joint disease), lumbosacral 11/10/2017    Priority: Medium    Fibromyalgia 11/10/2017    Priority: Medium    GERD (gastroesophageal reflux disease) 11/10/2017    Priority: Medium    Fatty liver 11/10/2017    Priority: Medium    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    Priority: Medium    History of renal artery stenosis 05/18/2011    Priority: Medium    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/26/2021    Priority: Low   Dyshidrotic eczema 11/10/2017    Priority: Low   Osteopenia 11/10/2017    Priority: Low   Does use hearing aid 11/10/2017    Priority: Low   Senile purpura ([place]) 12/22/2022   Daily consumption of alcohol 12/22/2022   S/P lumbar laminectomy 03/24/2022   Thyroid nodule greater than or equal to 1.5 cm in diameter incidentally noted on imaging study 08/27/2021   Angina pectoris ([place]) 06/25/2021    Allergies: Brilinta [ticagrelor], Bextra [valdecoxib], Hydrocod poli-chlorphe poli er, Lipitor [atorvastatin], Mevacor [lovastatin], Plavix [clopidogrel bisulfate], Zocor [simvastatin], Doxycycline, Metoprolol, Morphine and related, Codeine, and Lisinopril  Social History: Patient  reports that she quit smoking about 27 years ago. Her smoking use included cigarettes. She has never used smokeless tobacco. She reports current alcohol use of about 6.0 - 10.0 standard drinks of alcohol per week. She reports that she does not use drugs.  Current Meds  Medication Sig   Accu-Chek  FastClix Lancets MISC Check blood sugar once daily PRN   ALPRAZolam (XANAX) 0.25 MG tablet Take 1 tablet (0.25 mg total) by mouth at bedtime as needed for sleep. (Patient taking differently: Take 0.25 mg by mouth daily as needed for sleep or anxiety.)   amLODipine (NORVASC) 2.5 MG tablet Take 1 tablet (2.5 mg total) by mouth daily.   aspirin EC 81 MG tablet Take 1 tablet (81 mg total) by mouth daily.   atorvastatin (LIPITOR) 80 MG tablet TAKE 1 TABLET BY MOUTH EVERY DAY   B Complex-Biotin-FA (B-100 COMPLEX PO) Take 1 tablet by mouth every evening.   bismuth subsalicylate (PEPTO BISMOL) 262 MG/15ML suspension Take 30 mLs by mouth every 6 (six) hours as needed for indigestion or diarrhea or loose stools.   cetirizine (ZYRTEC)  10 MG tablet Take 10 mg by mouth daily as needed for allergies.   cholecalciferol (VITAMIN D3) 25 MCG (1000 UNIT) tablet Take 1,000 Units by mouth every evening.   CORDRAN 4 MCG/SQCM TAPE Apply 1 each topically daily as needed ([place]).    cycloSPORINE (RESTASIS) 0.05 % ophthalmic emulsion Place 1 drop into both eyes 2 (two) times daily.    ezetimibe (ZETIA) 10 MG tablet Take 10 mg by mouth every evening.   gabapentin (NEURONTIN) 300 MG capsule Take 1 capsule (300 mg total) by mouth 3 (three) times daily. (Patient taking differently: Take 300 mg by mouth 2 (two) times daily.)   glucose blood (ACCU-CHEK SMARTVIEW) test strip CHECK BLOOD SUGAR ONCE DAILY AS NEEDED   hydrochlorothiazide (MICROZIDE) 12.5 MG capsule Take 1 capsule (12.5 mg total) by mouth daily.   isosorbide mononitrate (IMDUR) 30 MG 24 hr tablet Take 3 tablets ( 90 mg ) every morning and 1 tablet (30 mg ) every evening (Patient taking differently: Take 30-90 mg by mouth See admin instructions. Take 3 tablets ( 90 mg ) every morning and 1 or 2 tablets (30 - 60 mg ) every evening)   losartan (COZAAR) 100 MG tablet Take 1 tablet (100 mg total) by mouth daily.   metoprolol succinate (TOPROL XL) 25 MG 24 hr tablet Take 1  tablet (25 mg total) by mouth daily.   Multiple Vitamins-Minerals (MULTIVITAMIN WITH MINERALS) tablet Take 1 tablet by mouth every evening.   nitroGLYCERIN (NITROSTAT) 0.4 MG SL tablet Place 1 tablet (0.4 mg total) under the tongue every 5 (five) minutes as needed for chest pain.   omeprazole (PRILOSEC) 40 MG capsule Take 1 capsule (40 mg total) by mouth in the morning and at bedtime.   oxyCODONE-acetaminophen (PERCOCET) 10-325 MG tablet Take 1 tablet by mouth in the morning and at bedtime. Can take one tablet three times a day as needed for back pain   polyethylene glycol (MIRALAX / GLYCOLAX) 17 g packet Take 17 g by mouth daily as needed for moderate constipation.   prasugrel (EFFIENT) 5 MG TABS tablet TAKE 1 TABLET (5 MG TOTAL) BY MOUTH DAILY.   PRESCRIPTION MEDICATION Place 2 puffs into both ears daily as needed (ear infections). CSA[facility] ear powder   Simethicone (GAS-X PO) Take 1-2 tablets by mouth daily as needed (gas).   Tiotropium Bromide-Olodaterol (STIOLTO RESPIMAT) 2.5-2.5 MCG/ACT AERS Inhale 2 puffs into the lungs daily.   tretinoin (RETIN-A) 0.1 % cream Apply 1 application. topically every evening.    Review of Systems: Cardiovascular: negative for chest pain, palpitations, leg swelling, orthopnea Respiratory: negative for SOB, wheezing or persistent cough Gastrointestinal: negative for abdominal pain Genitourinary: negative for dysuria or gross hematuria  Objective  Vitals: BP (!) 146/84   Pulse 73   Temp 97.8 F (36.6 C)   Ht 5' (1.524 m)   Wt 136 lb 9.6 oz (62 kg)   SpO2 90%   BMI 26.68 kg/m  General: no acute distress  Psych:  Alert and oriented, normal mood and affect HEENT:  Normocephalic, atraumatic, supple neck  Cardiovascular:  RRR  no edema Respiratory: CTAB with normal respiratory effort Skin:  Warm, no rashes Neurologic:   Mental status is normal Commons side effects, risks, benefits, and alternatives for medications and treatment plan prescribed today were  discussed, and the patient expressed understanding of the given instructions. Patient is instructed to call or message via MyChart if he/she has any questions or concerns regarding our treatment plan. No barriers to  understanding were identified. We discussed Red Flag symptoms and signs in detail. Patient expressed understanding regarding what to do in case of urgent or emergency type symptoms.  Medication list was reconciled, printed and provided to the patient in AVS. Patient instructions and summary information was reviewed with the patient as documented in the AVS. This note was prepared with assistance of Dragon voice recognition software. Occasional wrong-word or sound-a-like substitutions may have occurred due to the inherent limitation

## 2022-12-23 ENCOUNTER — Ambulatory Visit: Payer: Medicare Other | Admitting: Physical Therapy

## 2022-12-23 ENCOUNTER — Other Ambulatory Visit (INDEPENDENT_AMBULATORY_CARE_PROVIDER_SITE_OTHER): Payer: Medicare Other

## 2022-12-23 ENCOUNTER — Encounter: Payer: Self-pay | Admitting: Physical Therapy

## 2022-12-23 VITALS — BP 132/60 | HR 77

## 2022-12-23 DIAGNOSIS — R278 Other lack of coordination: Secondary | ICD-10-CM

## 2022-12-23 DIAGNOSIS — E042 Nontoxic multinodular goiter: Secondary | ICD-10-CM | POA: Diagnosis not present

## 2022-12-23 DIAGNOSIS — R2681 Unsteadiness on feet: Secondary | ICD-10-CM | POA: Diagnosis not present

## 2022-12-23 DIAGNOSIS — M6281 Muscle weakness (generalized): Secondary | ICD-10-CM | POA: Diagnosis not present

## 2022-12-23 DIAGNOSIS — R2689 Other abnormalities of gait and mobility: Secondary | ICD-10-CM | POA: Diagnosis not present

## 2022-12-23 DIAGNOSIS — R42 Dizziness and giddiness: Secondary | ICD-10-CM | POA: Diagnosis not present

## 2022-12-23 LAB — T4, FREE: Free T4: 0.83 ng/dL (ref 0.60–1.60)

## 2022-12-23 LAB — TSH: TSH: 1.86 u[IU]/mL (ref 0.35–5.50)

## 2022-12-23 NOTE — Therapy (Signed)
OUTPATIENT PHYSICAL THERAPY NEURO TREATMENT   Patient Name: Megan [name] MRN: [id number] DOB:February 29, 1946, 77 y.o., female 83 Date: 12/23/2022   PCP: [name], MD REFERRING PROVIDER: Eustace Moore, MD  END OF SESSION:  PT End of Session - 12/23/22 1403     Visit Number 3    Number of Visits 13    Date for PT Re-Evaluation 02/07/23    Authorization Type [employer] Medicare    PT Start Time 8119    PT Stop Time 1446    PT Time Calculation (min) 44 min    Equipment Utilized During Treatment Other (comment)   gait belt not donned as treatment was performed at mat level for today's session   Activity Tolerance Patient tolerated treatment well    Behavior During Therapy [facility] for tasks assessed/performed             Past Medical History:  Diagnosis Date   AAA (abdominal aortic aneurysm) ([place])    a. s/p stent grafting in 2009.   Adenomatous colon polyp 05/2001   Adenomatous duodenal polyp    Allergy    Anginal pain (HCC)    COPD (chronic obstructive pulmonary disease) ([place])    pt denies COPD   Coronary artery disease    a. s/p CABG in 1995;  b. 05/2013 Neg MV, EF 82%;  c. 06/2013 Cath: LM nl, LAD 40-50p, LCX nl, RCA 80p/161m VG->RCA->PDA->PLSA min irregs, LIMA->LAD atretic, vigorous LV fxn.   Diabetes mellitus without complication (HCC)    history of, resolved. 2017   Diverticulosis    Eczema, dyshidrotic    Eczema, dyshidrotic 1986   Dr. HMathis Fare  Emphysema of lung ([facility]    Fatty liver    Fibromyalgia    GERD (gastroesophageal reflux disease)    Hyperlipidemia    Hypertension    Migraine    Myocardial infarction ([facility]    Osteoarthritis    Osteoporosis    unsure, having a bone scan soon   [place]    left leg diagnosed by Dr BMare Ferrari  Renal artery stenosis ([facility]    a. 2008 s/p PTA   Past Surgical History:  Procedure Laterality Date   ABDOMINAL AORTAGRAM N/A 03/06/2014   Procedure: ABDOMINAL AORTAGRAM;  Surgeon: TRosetta Posner  MD;  [facility];  Service: Cardiovascular;  Laterality: N/A;   ABDOMINAL AORTIC ANEURYSM REPAIR  2009   stenting   ABDOMINAL HYSTERECTOMY     Total, 1992   BREAST BIOPSY     CARDIAC CATHETERIZATION     CARDIOVASCULAR STRESS TEST  11/11/2009   normal study   CARPAL TUNNEL RELEASE  08/1993   left wrist   CARPAL TUNNEL RELEASE  01/1997   right   CATARACT EXTRACTION, BILATERAL  2021   CORONARY ARTERY BYPASS GRAFT  07/1994   Triple by Dr ECeasar Mons  CORONARY STENT INTERVENTION N/A 02/02/2021   Procedure: CORONARY STENT INTERVENTION;  Surgeon: ENelva Bush MD;  [place];  Service: Cardiovascular;  Laterality: N/A;   CORONARY STENT INTERVENTION N/A 06/26/2021   Procedure: CORONARY STENT INTERVENTION;  Surgeon: CSherren Mocha MD;  [place] [facility];  Service: Cardiovascular;  Laterality: N/A;   dental implants     EYE SURGERY  03/2020   cataract   HAND SURGERY Right 09/2002   Right hand pulley release   INTRAVASCULAR ULTRASOUND/IVUS N/A 06/26/2021   Procedure: Intravascular Ultrasound/IVUS;  Surgeon: CSherren Mocha MD;  [facility];  Service: Cardiovascular;  Laterality: N/A;   INTRAVASCULAR ULTRASOUND/IVUS N/A 09/03/2021   Procedure: Intravascular Ultrasound/IVUS;  Surgeon: Early Osmond, MD;  Location: [place] CV LAB;  Service: Cardiovascular;  Laterality: N/A;   LAMINECTOMY WITH POSTERIOR LATERAL ARTHRODESIS LEVEL 1 N/A 03/24/2022   Procedure: LAMINECTOMY, FORAMINOTOMY, LUMBAR TWO- THREE ; INTERTRANSVERSE FUSION LUMBAR TWO - LUMBAR THREE RIGHT;  Surgeon: Eustace Moore, MD;  Location: [place];  Service: Neurosurgery;  Laterality: N/A;   LEFT HEART CATH AND CORONARY ANGIOGRAPHY N/A 06/26/2021   Procedure: LEFT HEART CATH AND CORONARY ANGIOGRAPHY;  Surgeon: Sherren Mocha, MD;  Location: [place] CV LAB;  Service: Cardiovascular;  Laterality: N/A;   LEFT HEART CATH AND CORS/GRAFTS ANGIOGRAPHY N/A 02/02/2021   Procedure:  LEFT HEART CATH AND CORS/GRAFTS ANGIOGRAPHY;  Surgeon: Nelva Bush, MD;  Location: [place] CV LAB;  Service: Cardiovascular;  Laterality: N/A;   LEFT HEART CATH AND CORS/GRAFTS ANGIOGRAPHY N/A 09/03/2021   Procedure: LEFT HEART CATH AND CORS/GRAFTS ANGIOGRAPHY;  Surgeon: Early Osmond, MD;  Location: [place] CV LAB;  Service: Cardiovascular;  Laterality: N/A;   LEFT HEART CATH AND CORS/GRAFTS ANGIOGRAPHY N/A 07/15/2022   Procedure: LEFT HEART CATH AND CORS/GRAFTS ANGIOGRAPHY;  Surgeon: Martinique, Peter M, MD;  Location: [place] CV LAB;  Service: Cardiovascular;  Laterality: N/A;   LEFT HEART CATHETERIZATION WITH CORONARY/GRAFT ANGIOGRAM N/A 06/11/2013   Procedure: LEFT HEART CATHETERIZATION WITH [name];  Surgeon: Thayer Headings, MD;  Location: [facility] CATH LAB;  Service: Cardiovascular;  Laterality: N/A;   MULTIPLE TOOTH EXTRACTIONS  2000   All upper teeth.  Has full dneture.    RENAL ARTERY STENT  2008   SHOULDER ARTHROSCOPY WITH ROTATOR CUFF REPAIR Right 09/27/2018   Procedure: Right shoulder mini open rotator cuff repair;  Surgeon: Susa Day, MD;  Location: [facility] ORS;  Service: Orthopedics;  Laterality: Right;  90 mins   thyroid cyst apiration  05/1997 and 07/1998   TONSILECTOMY, ADENOIDECTOMY, BILATERAL MYRINGOTOMY AND TUBES  1989   TONSILLECTOMY     TOTAL ABDOMINAL HYSTERECTOMY  1992   Patient Active Problem List   Diagnosis Date Noted   Senile purpura ([place]) 12/22/2022   Daily consumption of alcohol 12/22/2022   S/P lumbar laminectomy 03/24/2022   Thyroid nodule greater than or equal to 1.5 cm in diameter incidentally noted on imaging study 08/27/2021   Angina pectoris ([place]) 06/25/2021   History of compression fracture of spine - lumbar 06/19/2021   History of lumbosacral spine surgery 03/03/2021   NSTEMI (non-ST elevated myocardial infarction) ([place]) 2022 01/31/2021   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/26/2021   Neuropathic pain 12/05/2020    Gait instability 09/09/2020   Lumbar stenosis without neurogenic claudication 02/26/2020   COPD with chronic bronchitis 09/28/2018   Essential (primary) hypertension 07/10/2018   IFG (impaired fasting glucose) 11/10/2017   DJD (degenerative joint disease), lumbosacral 11/10/2017   Fibromyalgia 11/10/2017   Dyshidrotic eczema 11/10/2017   GERD (gastroesophageal reflux disease) 11/10/2017   Osteopenia 11/10/2017   Does use hearing aid 11/10/2017   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   Fatty liver 11/10/2017   Coronary artery disease    Peripheral vascular disease with claudication ([place])    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   History of abdominal aortic aneurysm repair 05/18/2011   History of renal artery stenosis 05/18/2011   Hyperlipidemia LDL goal <70     ONSET DATE: 12/07/2022  REFERRING DIAG: M48.061 (ICD-10-CM) - Spinal stenosis, lumbar region without neurogenic claudication  THERAPY DIAG:  Unsteadiness on feet  Muscle weakness (generalized)  Other abnormalities of gait and mobility  Other lack of coordination  Dizziness and giddiness  Rationale for Evaluation and Treatment: Rehabilitation  SUBJECTIVE:                                                                                                                                                                                             SUBJECTIVE STATEMENT: Patient hears better out of the L ear so therapist orients to patient's L side.  Patient reports that she has experienced occasional dizziness since last session but no true vertigo. Patient denies any falls/near falls since last session. Patient reports that she broke her toe bumping it on furniture but is otherwise doing well. Reports dizziness as 3-4/10 at present and notices it more when she is up and moving. She has a difficult time describing symptoms.  Pt accompanied by: self  PERTINENT HISTORY: PVD, MNG,COPD and CAD;  Lumbar Spinal Surgery 03/24/2022 1.   Decompressive lumbar laminectomy, medial facetectomy and foraminotomies L2-3 bilaterally, 2.  Right intertransverse arthrodesis L2-3 utilizing locally harvested morselized autologous bone graft and morselized allograft  PAIN:  Are you having pain? Yes: NPRS scale: 2/10 Pain location: B hips due to bursitis,  Pain description: constant (chronic pain) Aggravating factors: N/A Relieving factors: pain medication  PRECAUTIONS: Fall  PATIENT GOALS: "improve my balance and increase my core strength"                                                                                                                              Vestibular Assessment from Prior Session:   General Observation: NAD    Symptom Behavior:   Subjective history: Patient reports feeling unbalanced, not necessarily a "spinning" sensation, but more wobbly when turning. She had a bad cold the week after this past Thanksgiving, on abx and steroids for a week and balance got worse- doesn't remember what type of abx. Does report some spinning when first waking up.    Non-Vestibular symptoms:  none   Type of dizziness: Spinning/Vertigo, Unsteady with head/body  turns, and like she's drunk    Frequency: somewhat constant, but worse when she turns. Spinning vertigo is only in the AM, but sporadically   Duration: as long as she's doing the aggravating factor    Aggravating factors: Induced by position change: lying supine, Induced by motion: bending down to the ground, turning body quickly, and turning head quickly, Worse in the morning, and Moving eyes   Relieving factors: head stationary, rest, and slow movements   Progression of symptoms: worse   Oculomotor Exam:   Ocular Alignment: normal   Ocular ROM: No Limitations   Spontaneous Nystagmus: absent   Gaze-Induced Nystagmus: absent   Smooth Pursuits: intact   Saccades: intact   Convergence/Divergence: <5 cm     Vestibular-Ocular Reflex (VOR):   Slow VOR: Normal   VOR  Cancellation: Normal   Head-Impulse Test: HIT Right: positive HIT Left: positive   Dynamic Visual Acuity: Static: 11 Dynamic: 7   Motion Sensitivity:   Motion Sensitivity Quotient  Intensity: 0 = none, 1 = Lightheaded, 2 = Mild, 3 = Moderate, 4 = Severe, 5 = Vomiting Duration: < 5 s = 0, 5-10s = 1,11-30s = 2, >30s = 3 Score = Intensity + duration     Intensity Duration Score  1. Sitting to supine 2.5    2. Supine to L side 1    3. Supine to R side 0    4. Supine to sitting 2    5. L Hallpike-Dix 0    6. Up from L  1    7. R Hallpike-Dix 3    8. Up from R  4    9. Sitting, head  tipped to L knee 0    10. Head up from L  knee 2    11. Sitting, head  tipped to R knee 0    12. Head up from R  knee 2    13. Sitting head turns x5 1    14.Sitting head nods x5 3    15. In stance, 180  turn to L  0    16. In stance, 180  turn to R 1      TODAY'S TREATMENT:   Positional Testing: Negative roll test, L posterior canal reports brief double vision that disappears when patient blinks no nystagmus noted; R posterior canal canalithiasis; upbeating R torsional nystagmus lasting ~8s with ~3s latency   Canalith Repositioning: Epley Right: Number of Reps: 3 and Response to Treatment: symptoms improved   Patient presents with positive Marye Round of R posterior canal. Perform Epley. Patient reports dizziness in position 1 and none on remaining positions as progress through treatment. Patient presents with increased R torsional upbeating nystagmus for 10" with < 5" latency on reassessment and performed Epley for second time, reporting symptoms in position 2 as well with right upbeating torsional nystagmus noted for 5" with 2" latency. Retested [employer] and patient again presents with milder upbeating R torsional nystagmus for < 5" with 10" latency and treated with Epley for third attempt, with R upbeating nystagmus again only noted in position 2. Retest R Marye Round and patient  reports no symptoms and no nystagmus present. Patient returned to upright sitting.    PATIENT EDUCATION: Education details: exam findings and plan progressing forward Person educated: Patient Education method: Customer service manager Education comprehension: verbalized understanding, returned demonstration, and needs further education  HOME EXERCISE PROGRAM: To be initiated next session pending resolution of BPPV  GOALS: Goals reviewed with  patient? Yes  SHORT TERM GOALS: Target date: 01/03/2023    Pt will be independent with initial HEP for improved strength, balance, transfers and gait. Baseline: Goal status: INITIAL  2.  Pt will improve FGA to 22/30 for decreased fall risk  Baseline: 18/30 (1/8) Goal status: INITIAL  3.  Pt to improve time on Condition 4 of mCTSIB by >/= 5 sec to demonstrate improved balance and decreased fall risk Baseline: 9.6 sec (1/8) Goal status: INITIAL   LONG TERM GOALS: Target date: 01/27/2023   Pt will be independent with final HEP for improved strength, balance, transfers and gait and be able to state plan for exercise post-discharge in a community setting Baseline:  Goal status: INITIAL  2.  Pt will improve FGA to 24/30 for decreased fall risk  Baseline: 18/30 (1/8) Goal status: INITIAL  3.  Pt to improve time on Condition 4 of mCTSIB by >/= 10 sec to demonstrate improved balance and decreased fall risk Baseline: 9.6 sec (1/8) Goal status: INITIAL  4.  Pt will improve gait velocity to at least 3.5 ft/sec for improved gait efficiency and performance at mod I level  Baseline: 3.0 ft/sec with no AD (1/8) Goal status: INITIAL  ASSESSMENT:  CLINICAL IMPRESSION: Patient seen for skilled PT session with emphasis on vestibular assessment. Patient presented again with R upbeating torsional nystagmus when tested in [facility] with latency <10" and duration between 10-5" during session. Patient treated for Epley x 3 as noted above. On  fourth testing of R Marye Round, patient reports no symptoms and returned to upright sitting. Plan to reassess in next session and treat as indicated. If patient positional testing remains all clear, will transition to addressing underlying motion sensitivity as well. Continue POC.    OBJECTIVE IMPAIRMENTS: Abnormal gait, decreased balance, decreased strength, dizziness, and impaired perceived functional ability.   ACTIVITY LIMITATIONS: carrying, lifting, bending, squatting, stairs, and bed mobility  PARTICIPATION LIMITATIONS: meal prep, driving, and community activity  PERSONAL FACTORS: Age, Time since onset of injury/illness/exacerbation, and 1-2 comorbidities:    PVD, MNG,COPD and CAD are also affecting patient's functional outcome.   REHAB POTENTIAL: Good  CLINICAL DECISION MAKING: Stable/uncomplicated  EVALUATION COMPLEXITY: Low  PLAN:  PT FREQUENCY: 2x/week  PT DURATION: 6 weeks  PLANNED INTERVENTIONS: Therapeutic exercises, Therapeutic activity, Neuromuscular re-education, Balance training, Gait training, Patient/Family education, Self Care, Joint mobilization, Stair training, Vestibular training, Canalith repositioning, Visual/preceptual remediation/compensation, DME instructions, Dry Needling, Cryotherapy, Moist heat, Taping, Manual therapy, and Re-evaluation  PLAN FOR NEXT SESSION: recheck R BPPV, started to give HEP for habituation but held pending resolution of BPPV (sit > sidelying, bending down, head turns, VOR) RLE strengthening, wanting to know what sort of equipment to use at the gym upon d/c from PT services  Malachi Carl, PT, DPT 12/23/2022, 4:02 PM

## 2022-12-26 ENCOUNTER — Other Ambulatory Visit: Payer: Self-pay | Admitting: Cardiology

## 2022-12-28 ENCOUNTER — Encounter: Payer: Self-pay | Admitting: Physical Therapy

## 2022-12-28 ENCOUNTER — Ambulatory Visit: Payer: Medicare Other | Admitting: Physical Therapy

## 2022-12-28 VITALS — BP 153/71 | HR 74

## 2022-12-28 DIAGNOSIS — M6281 Muscle weakness (generalized): Secondary | ICD-10-CM

## 2022-12-28 DIAGNOSIS — R278 Other lack of coordination: Secondary | ICD-10-CM

## 2022-12-28 DIAGNOSIS — R42 Dizziness and giddiness: Secondary | ICD-10-CM

## 2022-12-28 DIAGNOSIS — R2681 Unsteadiness on feet: Secondary | ICD-10-CM | POA: Diagnosis not present

## 2022-12-28 DIAGNOSIS — R2689 Other abnormalities of gait and mobility: Secondary | ICD-10-CM | POA: Diagnosis not present

## 2022-12-28 NOTE — Therapy (Addendum)
OUTPATIENT PHYSICAL THERAPY NEURO TREATMENT   Patient Name: Megan [name] MRN: [id number] DOB:05/01/1946, 77 y.o., female 87 Date: 12/28/2022   PCP: [name], MD REFERRING PROVIDER: Eustace Moore, MD  END OF SESSION:  PT End of Session - 12/28/22 1104     Visit Number 4    Number of Visits 13    Date for PT Re-Evaluation 02/07/23    Authorization Type [employer] Medicare    PT Start Time 1104    PT Stop Time 1145    PT Time Calculation (min) 41 min    Equipment Utilized During Treatment Other (comment)   session performed on mat level; gait belt not indicated for tasks in today's session   Activity Tolerance Patient tolerated treatment well    Behavior During Therapy [facility] for tasks assessed/performed             Past Medical History:  Diagnosis Date   AAA (abdominal aortic aneurysm) ([place])    a. s/p stent grafting in 2009.   Adenomatous colon polyp 05/2001   Adenomatous duodenal polyp    Allergy    Anginal pain (HCC)    COPD (chronic obstructive pulmonary disease) ([place])    pt denies COPD   Coronary artery disease    a. s/p CABG in 1995;  b. 05/2013 Neg MV, EF 82%;  c. 06/2013 Cath: LM nl, LAD 40-50p, LCX nl, RCA 80p/137m VG->RCA->PDA->PLSA min irregs, LIMA->LAD atretic, vigorous LV fxn.   Diabetes mellitus without complication (HCC)    history of, resolved. 2017   Diverticulosis    Eczema, dyshidrotic    Eczema, dyshidrotic 1986   Dr. HMathis Fare  Emphysema of lung ([facility]    Fatty liver    Fibromyalgia    GERD (gastroesophageal reflux disease)    Hyperlipidemia    Hypertension    Migraine    Myocardial infarction ([facility]    Osteoarthritis    Osteoporosis    unsure, having a bone scan soon   [place]    left leg diagnosed by Dr BMare Ferrari  Renal artery stenosis ([facility]    a. 2008 s/p PTA   Past Surgical History:  Procedure Laterality Date   ABDOMINAL AORTAGRAM N/A 03/06/2014   Procedure: ABDOMINAL AORTAGRAM;  Surgeon: TRosetta Posner MD;  [facility];  Service: Cardiovascular;  Laterality: N/A;   ABDOMINAL AORTIC ANEURYSM REPAIR  2009   stenting   ABDOMINAL HYSTERECTOMY     Total, 1992   BREAST BIOPSY     CARDIAC CATHETERIZATION     CARDIOVASCULAR STRESS TEST  11/11/2009   normal study   CARPAL TUNNEL RELEASE  08/1993   left wrist   CARPAL TUNNEL RELEASE  01/1997   right   CATARACT EXTRACTION, BILATERAL  2021   CORONARY ARTERY BYPASS GRAFT  07/1994   Triple by Dr ECeasar Mons  CORONARY STENT INTERVENTION N/A 02/02/2021   Procedure: CORONARY STENT INTERVENTION;  Surgeon: ENelva Bush MD;  [place];  Service: Cardiovascular;  Laterality: N/A;   CORONARY STENT INTERVENTION N/A 06/26/2021   Procedure: CORONARY STENT INTERVENTION;  Surgeon: CSherren Mocha MD;  [place];  Service: Cardiovascular;  Laterality: N/A;   dental implants     EYE SURGERY  03/2020   cataract   HAND SURGERY Right 09/2002   Right hand pulley release   INTRAVASCULAR ULTRASOUND/IVUS N/A 06/26/2021   Procedure: Intravascular Ultrasound/IVUS;  Surgeon: CSherren Mocha MD;  [facility];  Service: Cardiovascular;  Laterality: N/A;   INTRAVASCULAR ULTRASOUND/IVUS N/A 09/03/2021   Procedure: Intravascular Ultrasound/IVUS;  Surgeon: Early Osmond, MD;  Location: [place] CV LAB;  Service: Cardiovascular;  Laterality: N/A;   LAMINECTOMY WITH POSTERIOR LATERAL ARTHRODESIS LEVEL 1 N/A 03/24/2022   Procedure: LAMINECTOMY, FORAMINOTOMY, LUMBAR TWO- THREE ; INTERTRANSVERSE FUSION LUMBAR TWO - LUMBAR THREE RIGHT;  Surgeon: Eustace Moore, MD;  Location: [place];  Service: Neurosurgery;  Laterality: N/A;   LEFT HEART CATH AND CORONARY ANGIOGRAPHY N/A 06/26/2021   Procedure: LEFT HEART CATH AND CORONARY ANGIOGRAPHY;  Surgeon: Sherren Mocha, MD;  Location: [place] CV LAB;  Service: Cardiovascular;  Laterality: N/A;   LEFT HEART CATH AND CORS/GRAFTS ANGIOGRAPHY N/A 02/02/2021    Procedure: LEFT HEART CATH AND CORS/GRAFTS ANGIOGRAPHY;  Surgeon: Nelva Bush, MD;  Location: [place] CV LAB;  Service: Cardiovascular;  Laterality: N/A;   LEFT HEART CATH AND CORS/GRAFTS ANGIOGRAPHY N/A 09/03/2021   Procedure: LEFT HEART CATH AND CORS/GRAFTS ANGIOGRAPHY;  Surgeon: Early Osmond, MD;  Location: [place] CV LAB;  Service: Cardiovascular;  Laterality: N/A;   LEFT HEART CATH AND CORS/GRAFTS ANGIOGRAPHY N/A 07/15/2022   Procedure: LEFT HEART CATH AND CORS/GRAFTS ANGIOGRAPHY;  Surgeon: Martinique, Peter M, MD;  Location: [place] CV LAB;  Service: Cardiovascular;  Laterality: N/A;   LEFT HEART CATHETERIZATION WITH CORONARY/GRAFT ANGIOGRAM N/A 06/11/2013   Procedure: LEFT HEART CATHETERIZATION WITH [name];  Surgeon: Thayer Headings, MD;  Location: [facility] CATH LAB;  Service: Cardiovascular;  Laterality: N/A;   MULTIPLE TOOTH EXTRACTIONS  2000   All upper teeth.  Has full dneture.    RENAL ARTERY STENT  2008   SHOULDER ARTHROSCOPY WITH ROTATOR CUFF REPAIR Right 09/27/2018   Procedure: Right shoulder mini open rotator cuff repair;  Surgeon: Susa Day, MD;  Location: [facility];  Service: Orthopedics;  Laterality: Right;  90 mins   thyroid cyst apiration  05/1997 and 07/1998   TONSILECTOMY, ADENOIDECTOMY, BILATERAL MYRINGOTOMY AND TUBES  1989   TONSILLECTOMY     TOTAL ABDOMINAL HYSTERECTOMY  1992   Patient Active Problem List   Diagnosis Date Noted   Senile purpura ([place]) 12/22/2022   Daily consumption of alcohol 12/22/2022   S/P lumbar laminectomy 03/24/2022   Thyroid nodule greater than or equal to 1.5 cm in diameter incidentally noted on imaging study 08/27/2021   Angina pectoris ([place]) 06/25/2021   History of compression fracture of spine - lumbar 06/19/2021   History of lumbosacral spine surgery 03/03/2021   NSTEMI (non-ST elevated myocardial infarction) ([place]) 2022 01/31/2021   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/26/2021   Neuropathic pain  12/05/2020   Gait instability 09/09/2020   Lumbar stenosis without neurogenic claudication 02/26/2020   COPD with chronic bronchitis 09/28/2018   Essential (primary) hypertension 07/10/2018   IFG (impaired fasting glucose) 11/10/2017   DJD (degenerative joint disease), lumbosacral 11/10/2017   Fibromyalgia 11/10/2017   Dyshidrotic eczema 11/10/2017   GERD (gastroesophageal reflux disease) 11/10/2017   Osteopenia 11/10/2017   Does use hearing aid 11/10/2017   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   Fatty liver 11/10/2017   Coronary artery disease    Peripheral vascular disease with claudication ([place])    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   History of abdominal aortic aneurysm repair 05/18/2011   History of renal artery stenosis 05/18/2011   Hyperlipidemia LDL goal <70     ONSET DATE: 12/07/2022  REFERRING DIAG: M48.061 (ICD-10-CM) - Spinal stenosis, lumbar region without neurogenic claudication  THERAPY DIAG:  Unsteadiness on feet  Muscle weakness (generalized)  Other abnormalities of gait and mobility  Other lack of coordination  Dizziness and giddiness  Rationale for Evaluation and Treatment: Rehabilitation  SUBJECTIVE:                                                                                                                                                                                             SUBJECTIVE STATEMENT: Patient hears better out of the L ear so therapist orients to patient's L side.  Patient reports that she has felt a little worse over the last few days. She reports that her hearing aids seem to be off, stating she feels like she is in a tunnel. She tried to walk a short distance with her friend in an apartment without her cane and felt extremely off balance and had to go back and get it. Reports no falls since last session.   Pt accompanied by: self  PERTINENT HISTORY: PVD, MNG,COPD and CAD;  Lumbar Spinal Surgery 03/24/2022 1.  Decompressive lumbar  laminectomy, medial facetectomy and foraminotomies L2-3 bilaterally, 2.  Right intertransverse arthrodesis L2-3 utilizing locally harvested morselized autologous bone graft and morselized allograft  PAIN:  Are you having pain? Yes: NPRS scale: 2/10 Pain location: B hips due to bursitis,  Pain description: constant (chronic pain) Aggravating factors: N/A Relieving factors: pain medication  PRECAUTIONS: Fall  PATIENT GOALS: "improve my balance and increase my core strength"                                                                                                                              Vestibular Assessment from Prior Session:   General Observation: NAD    Symptom Behavior:   Subjective history: Patient reports feeling unbalanced, not necessarily a "spinning" sensation, but more wobbly when turning. She had a bad cold the week after this past Thanksgiving, on abx and steroids for a week and balance got worse- doesn't remember what type of abx. Does report some spinning when first waking up.    Non-Vestibular symptoms:  none  Type of dizziness: Spinning/Vertigo, Unsteady with head/body turns, and like she's drunk    Frequency: somewhat constant, but worse when she turns. Spinning vertigo is only in the AM, but sporadically   Duration: as long as she's doing the aggravating factor    Aggravating factors: Induced by position change: lying supine, Induced by motion: bending down to the ground, turning body quickly, and turning head quickly, Worse in the morning, and Moving eyes   Relieving factors: head stationary, rest, and slow movements   Progression of symptoms: worse   Oculomotor Exam:   Ocular Alignment: normal   Ocular ROM: No Limitations   Spontaneous Nystagmus: absent   Gaze-Induced Nystagmus: absent   Smooth Pursuits: intact   Saccades: intact   Convergence/Divergence: <5 cm     Vestibular-Ocular Reflex (VOR):   Slow VOR: Normal   VOR Cancellation:  Normal   Head-Impulse Test: HIT Right: positive HIT Left: positive   Dynamic Visual Acuity: Static: 11 Dynamic: 7   Motion Sensitivity:   Motion Sensitivity Quotient  Intensity: 0 = none, 1 = Lightheaded, 2 = Mild, 3 = Moderate, 4 = Severe, 5 = Vomiting Duration: < 5 s = 0, 5-10s = 1,11-30s = 2, >30s = 3 Score = Intensity + duration     Intensity Duration Score  1. Sitting to supine 2.5    2. Supine to L side 1    3. Supine to R side 0    4. Supine to sitting 2    5. L Hallpike-Dix 0    6. Up from L  1    7. R Hallpike-Dix 3    8. Up from R  4    9. Sitting, head  tipped to L knee 0    10. Head up from L  knee 2    11. Sitting, head  tipped to R knee 0    12. Head up from R  knee 2    13. Sitting head turns x5 1    14.Sitting head nods x5 3    15. In stance, 180  turn to L  0    16. In stance, 180  turn to R 1     Vitals:   12/28/22 1109  BP: (!) 153/71  Pulse: 74     TODAY'S TREATMENT:   Positional Testing: L posterior canal noted downbeat torsional nystagmus (unclear directional) R posterior canal canalithiasis; upbeating R torsional nystagmus lasting ~45s with ~10s latency. Attempted use of frenzel goggles but reduced view. Had second therapist - Manuela Schwartz - assess directional component and agreed to treat for R horizontal canal.  Canalith Repositioning: Epley Right: Number of Reps: 2 and Response to Treatment: symptoms improved   Patient presents with positive Marye Round of R posterior canal. Perform Epley. Patient reports dizziness in position 1 and 2 and mild dizziness when coming to sit. Patient presents with increased R torsional upbeating nystagmus for 10" with 10" latency on reassessment and performed Epley for second time, reporting symptoms in position 2 as well with mild downbeating torsional nystagmus noted for 5" with 5" latency. Retest R Marye Round and patient reports no symptoms and no nystagmus present. Patient returned to upright sitting.     PATIENT EDUCATION: Education details: exam findings and POC (hold HEP until BPPV resolves) Person educated: Patient Education method: Customer service manager Education comprehension: verbalized understanding, returned demonstration, and needs further education  HOME EXERCISE PROGRAM: To be initiated next session pending resolution of BPPV  GOALS: Goals  reviewed with patient? Yes  SHORT TERM GOALS: Target date: 01/03/2023    Pt will be independent with initial HEP for improved strength, balance, transfers and gait. Baseline: Goal status: INITIAL  2.  Pt will improve FGA to 22/30 for decreased fall risk  Baseline: 18/30 (1/8) Goal status: INITIAL  3.  Pt to improve time on Condition 4 of mCTSIB by >/= 5 sec to demonstrate improved balance and decreased fall risk Baseline: 9.6 sec (1/8) Goal status: INITIAL   LONG TERM GOALS: Target date: 01/27/2023   Pt will be independent with final HEP for improved strength, balance, transfers and gait and be able to state plan for exercise post-discharge in a community setting Baseline:  Goal status: INITIAL  2.  Pt will improve FGA to 24/30 for decreased fall risk  Baseline: 18/30 (1/8) Goal status: INITIAL  3.  Pt to improve time on Condition 4 of mCTSIB by >/= 10 sec to demonstrate improved balance and decreased fall risk Baseline: 9.6 sec (1/8) Goal status: INITIAL  4.  Pt will improve gait velocity to at least 3.5 ft/sec for improved gait efficiency and performance at mod I level  Baseline: 3.0 ft/sec with no AD (1/8) Goal status: INITIAL  ASSESSMENT:  CLINICAL IMPRESSION: Patient seen for skilled PT session with emphasis continued vestibular treatment. Patient presented again with R upbeating torsional nystagmus when tested in [facility] with latency 10" and duration between 10-45" during session. Patient treated for Epley x 2 as noted above. On third testing of R Marye Round, patient reports no symptoms and  returned to upright sitting. Plan to reassess in next session and treat as indicated. Brief downbeating torsional nystagmus noted on L [employer] testing with report of symptoms indicating possible L anterior canal BPPV as well. Treat as indicated. If patient positional testing remains all clear, will transition to addressing underlying motion sensitivity as well. Continue POC.    OBJECTIVE IMPAIRMENTS: Abnormal gait, decreased balance, decreased strength, dizziness, and impaired perceived functional ability.   ACTIVITY LIMITATIONS: carrying, lifting, bending, squatting, stairs, and bed mobility  PARTICIPATION LIMITATIONS: meal prep, driving, and community activity  PERSONAL FACTORS: Age, Time since onset of injury/illness/exacerbation, and 1-2 comorbidities:    PVD, MNG,COPD and CAD are also affecting patient's functional outcome.   REHAB POTENTIAL: Good  CLINICAL DECISION MAKING: Stable/uncomplicated  EVALUATION COMPLEXITY: Low  PLAN:  PT FREQUENCY: 2x/week  PT DURATION: 6 weeks  PLANNED INTERVENTIONS: Therapeutic exercises, Therapeutic activity, Neuromuscular re-education, Balance training, Gait training, Patient/Family education, Self Care, Joint mobilization, Stair training, Vestibular training, Canalith repositioning, Visual/preceptual remediation/compensation, DME instructions, Dry Needling, Cryotherapy, Moist heat, Taping, Manual therapy, and Re-evaluation  PLAN FOR NEXT SESSION: recheck R Posterior BPPV and possible L anterior canal BPPV, started to give HEP for habituation but held pending resolution of BPPV (sit > sidelying, bending down, head turns, VOR) RLE strengthening, wanting to know what sort of equipment to use at the gym upon d/c from PT services  Malachi Carl, PT, DPT 12/28/2022, 1:16 PM

## 2022-12-30 ENCOUNTER — Ambulatory Visit: Payer: Medicare Other

## 2022-12-30 DIAGNOSIS — R2681 Unsteadiness on feet: Secondary | ICD-10-CM

## 2022-12-30 DIAGNOSIS — M6281 Muscle weakness (generalized): Secondary | ICD-10-CM | POA: Diagnosis not present

## 2022-12-30 DIAGNOSIS — R278 Other lack of coordination: Secondary | ICD-10-CM | POA: Diagnosis not present

## 2022-12-30 DIAGNOSIS — R2689 Other abnormalities of gait and mobility: Secondary | ICD-10-CM | POA: Diagnosis not present

## 2022-12-30 DIAGNOSIS — R42 Dizziness and giddiness: Secondary | ICD-10-CM | POA: Diagnosis not present

## 2022-12-30 DIAGNOSIS — H04123 Dry eye syndrome of bilateral lacrimal glands: Secondary | ICD-10-CM | POA: Diagnosis not present

## 2022-12-30 NOTE — Therapy (Signed)
OUTPATIENT PHYSICAL THERAPY NEURO TREATMENT   Patient Name: Megan [name] MRN: [id number] DOB:[birth year], 77 y.o., female 55 Date: 12/30/2022   PCP: [name], MD REFERRING PROVIDER: Eustace Moore, MD  END OF SESSION:  PT End of Session - 12/30/22 1103     Visit Number 5    Number of Visits 13    Date for PT Re-Evaluation 02/07/23    Authorization Type UHC Medicare    PT Start Time 1102    PT Stop Time 1144    PT Time Calculation (min) 42 min    Activity Tolerance Patient tolerated treatment well    Behavior During Therapy WFL for tasks assessed/performed             Past Medical History:  Diagnosis Date   AAA (abdominal aortic aneurysm) ([place])    a. s/p stent grafting in 2009.   Adenomatous colon polyp 05/2001   Adenomatous duodenal polyp    Allergy    Anginal pain (HCC)    COPD (chronic obstructive pulmonary disease) ([place])    pt denies COPD   Coronary artery disease    a. s/p CABG in 1995;  b. 05/2013 Neg MV, EF 82%;  c. 06/2013 Cath: LM nl, LAD 40-50p, LCX nl, RCA 80p/177m VG->RCA->PDA->PLSA min irregs, LIMA->LAD atretic, vigorous LV fxn.   Diabetes mellitus without complication (HCC)    history of, resolved. 2017   Diverticulosis    Eczema, dyshidrotic    Eczema, dyshidrotic 1986   Dr. HMathis Fare  Emphysema of lung ([facility]    Fatty liver    Fibromyalgia    GERD (gastroesophageal reflux disease)    Hyperlipidemia    Hypertension    Migraine    Myocardial infarction ([facility]    Osteoarthritis    Osteoporosis    unsure, having a bone scan soon   Peripheral arterial disease ([place]    left leg diagnosed by Dr BMare Ferrari  Renal artery stenosis ([facility]    a. 2008 s/p PTA   Past Surgical History:  Procedure Laterality Date   ABDOMINAL AORTAGRAM N/A 03/06/2014   Procedure: ABDOMINAL AORTAGRAM;  Surgeon: TRosetta Posner MD;  [facility];  Service: Cardiovascular;  Laterality: N/A;   ABDOMINAL AORTIC ANEURYSM REPAIR  2009   stenting   ABDOMINAL  HYSTERECTOMY     Total, 1992   BREAST BIOPSY     CARDIAC CATHETERIZATION     CARDIOVASCULAR STRESS TEST  11/11/2009   normal study   CARPAL TUNNEL RELEASE  08/1993   left wrist   CARPAL TUNNEL RELEASE  01/1997   right   CATARACT EXTRACTION, BILATERAL  2021   CORONARY ARTERY BYPASS GRAFT  07/1994   Triple by Dr ECeasar Mons  CORONARY STENT INTERVENTION N/A 02/02/2021   Procedure: CORONARY STENT INTERVENTION;  Surgeon: ENelva Bush MD;  [place];  Service: Cardiovascular;  Laterality: N/A;   CORONARY STENT INTERVENTION N/A 06/26/2021   Procedure: CORONARY STENT INTERVENTION;  Surgeon: CSherren Mocha MD;  [place] [facility];  Service: Cardiovascular;  Laterality: N/A;   dental implants     EYE SURGERY  03/2020   cataract   HAND SURGERY Right 09/2002   Right hand pulley release   INTRAVASCULAR ULTRASOUND/IVUS N/A 06/26/2021   Procedure: Intravascular Ultrasound/IVUS;  Surgeon: CSherren Mocha MD;  [place];  Service: Cardiovascular;  Laterality: N/A;   INTRAVASCULAR ULTRASOUND/IVUS N/A 09/03/2021   Procedure: Intravascular Ultrasound/IVUS;  Surgeon: TLenna Sciara  K, MD;  Location: [place] CV LAB;  Service: Cardiovascular;  Laterality: N/A;   LAMINECTOMY WITH POSTERIOR LATERAL ARTHRODESIS LEVEL 1 N/A 03/24/2022   Procedure: LAMINECTOMY, FORAMINOTOMY, LUMBAR TWO- THREE ; INTERTRANSVERSE FUSION LUMBAR TWO - LUMBAR THREE RIGHT;  Surgeon: Eustace Moore, MD;  Location: [place];  Service: Neurosurgery;  Laterality: N/A;   LEFT HEART CATH AND CORONARY ANGIOGRAPHY N/A 06/26/2021   Procedure: LEFT HEART CATH AND CORONARY ANGIOGRAPHY;  Surgeon: Sherren Mocha, MD;  Location: [place] CV LAB;  Service: Cardiovascular;  Laterality: N/A;   LEFT HEART CATH AND CORS/GRAFTS ANGIOGRAPHY N/A 02/02/2021   Procedure: LEFT HEART CATH AND CORS/GRAFTS ANGIOGRAPHY;  Surgeon: Nelva Bush, MD;  Location: [place] CV LAB;  Service: Cardiovascular;   Laterality: N/A;   LEFT HEART CATH AND CORS/GRAFTS ANGIOGRAPHY N/A 09/03/2021   Procedure: LEFT HEART CATH AND CORS/GRAFTS ANGIOGRAPHY;  Surgeon: Early Osmond, MD;  Location: [place] CV LAB;  Service: Cardiovascular;  Laterality: N/A;   LEFT HEART CATH AND CORS/GRAFTS ANGIOGRAPHY N/A 07/15/2022   Procedure: LEFT HEART CATH AND CORS/GRAFTS ANGIOGRAPHY;  Surgeon: Martinique, Peter M, MD;  Location: [place] CV LAB;  Service: Cardiovascular;  Laterality: N/A;   LEFT HEART CATHETERIZATION WITH CORONARY/GRAFT ANGIOGRAM N/A 06/11/2013   Procedure: LEFT HEART CATHETERIZATION WITH [name];  Surgeon: Thayer Headings, MD;  Location: [facility] CATH LAB;  Service: Cardiovascular;  Laterality: N/A;   MULTIPLE TOOTH EXTRACTIONS  2000   All upper teeth.  Has full dneture.    RENAL ARTERY STENT  2008   SHOULDER ARTHROSCOPY WITH ROTATOR CUFF REPAIR Right 09/27/2018   Procedure: Right shoulder mini open rotator cuff repair;  Surgeon: Susa Day, MD;  [facility];  Service: Orthopedics;  Laterality: Right;  90 mins   thyroid cyst apiration  05/1997 and 07/1998   TONSILECTOMY, ADENOIDECTOMY, BILATERAL MYRINGOTOMY AND TUBES  1989   TONSILLECTOMY     TOTAL ABDOMINAL HYSTERECTOMY  1992   Patient Active Problem List   Diagnosis Date Noted   Senile purpura ([place]) 12/22/2022   Daily consumption of alcohol 12/22/2022   S/P lumbar laminectomy 03/24/2022   Thyroid nodule greater than or equal to 1.5 cm in diameter incidentally noted on imaging study 08/27/2021   Angina pectoris ([place]) 06/25/2021   History of compression fracture of spine - lumbar 06/19/2021   History of lumbosacral spine surgery 03/03/2021   NSTEMI (non-ST elevated myocardial infarction) ([place]) 2022 01/31/2021   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/26/2021   Neuropathic pain 12/05/2020   Gait instability 09/09/2020   Lumbar stenosis without neurogenic claudication 02/26/2020   COPD with chronic bronchitis 09/28/2018    Essential (primary) hypertension 07/10/2018   IFG (impaired fasting glucose) 11/10/2017   DJD (degenerative joint disease), lumbosacral 11/10/2017   Fibromyalgia 11/10/2017   Dyshidrotic eczema 11/10/2017   GERD (gastroesophageal reflux disease) 11/10/2017   Osteopenia 11/10/2017   Does use hearing aid 11/10/2017   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   Fatty liver 11/10/2017   Coronary artery disease    Peripheral vascular disease with claudication ([place])    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   History of abdominal aortic aneurysm repair 05/18/2011   History of renal artery stenosis 05/18/2011   Hyperlipidemia LDL goal <70     ONSET DATE: 12/07/2022  REFERRING DIAG: M48.061 (ICD-10-CM) - Spinal stenosis, lumbar region without neurogenic claudication  THERAPY DIAG:  Unsteadiness on feet  Other abnormalities of gait and mobility  Muscle weakness (generalized)  Other lack of coordination  Dizziness and giddiness  Rationale for Evaluation and Treatment: Rehabilitation  SUBJECTIVE:                                                                                                                                                                                             SUBJECTIVE STATEMENT: Patient reports doing well- still with her same dizziness making her feel intoxicated. Denies falls/near falls.   Pt accompanied by: self  PERTINENT HISTORY: PVD, MNG,COPD and CAD;  Lumbar Spinal Surgery 03/24/2022 1.  Decompressive lumbar laminectomy, medial facetectomy and foraminotomies L2-3 bilaterally, 2.  Right intertransverse arthrodesis L2-3 utilizing locally harvested morselized autologous bone graft and morselized allograft  PAIN:  Are you having pain? Yes: NPRS scale: 2/10 Pain location: B hips due to bursitis,  Pain description: constant (chronic pain) Aggravating factors: N/A Relieving factors: pain medication  PRECAUTIONS: Fall  PATIENT GOALS: "improve my balance and increase my core  strength"  TODAY'S TREATMENT:   Positional Testing:  -dix hallpike 1st trial: R posterior canal canalithiasis with 15s latency and large amplitude R torsional upbeating nystagmus lasting ~15s   -Epley x1: tolerated well with no additional nystagmus -dix hallpike 2nd trial: R posterior canal canalithiasis with 10s latency and small amplitude R torsional nystagmus lasting ~4-5 beats  -Epley x2: tolerated well with noted L torsional downbeating with 10s latency, small amplitude lasting 4-5 beats  -Dix hallpike 3rd trial: R posterior canal canalithiasis with 10s latency and small amplitude R torsional nystagmus lasting ~4-5 beats  -Epley x3: tolerated well with noted L torsional downbeating with 10s latency, small amplitude lasting 4-5 beats  -dix hallpike: 4th trial: R posterior canal canalithiasis with 5s latency larger amplitude than previous bouts lasting ~8s   -Epley x4: tolerated well with no noted additional nystagmus  -[employer] 5th trial: no nystagmus and no reports of dizziness   PATIENT EDUCATION: Education details: exam findings and POC (hold HEP until BPPV resolves) Person educated: Patient Education method: Customer service manager Education comprehension: verbalized understanding, returned demonstration, and needs further education  HOME EXERCISE PROGRAM: To be initiated next session pending resolution of BPPV  GOALS: Goals reviewed with patient? Yes  SHORT TERM GOALS: Target date: 01/03/2023    Pt will be independent with initial HEP for improved strength, balance, transfers and gait. Baseline: Goal status: INITIAL  2.  Pt will improve FGA to 22/30 for decreased fall risk  Baseline: 18/30 (1/8) Goal status: INITIAL  3.  Pt to improve time on Condition 4 of mCTSIB by >/= 5 sec to demonstrate improved balance and decreased fall risk Baseline: 9.6 sec (1/8) Goal status: INITIAL   LONG TERM GOALS:  Target date: 01/27/2023   Pt will be independent with final  HEP for improved strength, balance, transfers and gait and be able to state plan for exercise post-discharge in a community setting Baseline:  Goal status: INITIAL  2.  Pt will improve FGA to 24/30 for decreased fall risk  Baseline: 18/30 (1/8) Goal status: INITIAL  3.  Pt to improve time on Condition 4 of mCTSIB by >/= 10 sec to demonstrate improved balance and decreased fall risk Baseline: 9.6 sec (1/8) Goal status: INITIAL  4.  Pt will improve gait velocity to at least 3.5 ft/sec for improved gait efficiency and performance at mod I level  Baseline: 3.0 ft/sec with no AD (1/8) Goal status: INITIAL  ASSESSMENT:  CLINICAL IMPRESSION: Patient seen for skilled PT session with emphasis continued vestibular treatment. Initially presenting with R BPPV consistent with R posterior canal canalithiasis. Treated with epley. In 2nd trial of Epley, PT observing L downbeating torsional nystagmus consistent with possible L anterior canal canalithiasis. She tolerated a total of 4 Epley maneuvers with resolution of BPPV noted on final check of dix hallpike. Continue POC.    OBJECTIVE IMPAIRMENTS: Abnormal gait, decreased balance, decreased strength, dizziness, and impaired perceived functional ability.   ACTIVITY LIMITATIONS: carrying, lifting, bending, squatting, stairs, and bed mobility  PARTICIPATION LIMITATIONS: meal prep, driving, and community activity  PERSONAL FACTORS: Age, Time since onset of injury/illness/exacerbation, and 1-2 comorbidities:    PVD, MNG,COPD and CAD are also affecting patient's functional outcome.   REHAB POTENTIAL: Good  CLINICAL DECISION MAKING: Stable/uncomplicated  EVALUATION COMPLEXITY: Low  PLAN:  PT FREQUENCY: 2x/week  PT DURATION: 6 weeks  PLANNED INTERVENTIONS: Therapeutic exercises, Therapeutic activity, Neuromuscular re-education, Balance training, Gait training, Patient/Family education, Self Care, Joint mobilization, Stair training, Vestibular  training, Canalith repositioning, Visual/preceptual remediation/compensation, DME instructions, Dry Needling, Cryotherapy, Moist heat, Taping, Manual therapy, and Re-evaluation  PLAN FOR NEXT SESSION: recheck R Posterior BPPV and possible L anterior canal BPPV, started to give HEP for habituation but held pending resolution of BPPV (sit > sidelying, bending down, head turns, VOR) RLE strengthening, wanting to know what sort of equipment to use at the gym upon d/c from PT services  Debbora Dus, PT, DPT, CBIS 12/30/2022, 11:51 AM

## 2023-01-04 ENCOUNTER — Ambulatory Visit: Payer: Medicare Other

## 2023-01-04 DIAGNOSIS — R2689 Other abnormalities of gait and mobility: Secondary | ICD-10-CM | POA: Diagnosis not present

## 2023-01-04 DIAGNOSIS — R278 Other lack of coordination: Secondary | ICD-10-CM | POA: Diagnosis not present

## 2023-01-04 DIAGNOSIS — M6281 Muscle weakness (generalized): Secondary | ICD-10-CM

## 2023-01-04 DIAGNOSIS — R2681 Unsteadiness on feet: Secondary | ICD-10-CM | POA: Diagnosis not present

## 2023-01-04 DIAGNOSIS — R42 Dizziness and giddiness: Secondary | ICD-10-CM

## 2023-01-04 NOTE — Therapy (Signed)
OUTPATIENT PHYSICAL THERAPY NEURO TREATMENT   Patient Name: Megan [name] MRN: [id number] DOB:1946-07-01, 77 y.o., female 39 Date: 01/04/2023   PCP: [name], MD REFERRING PROVIDER: Eustace Moore, MD  END OF SESSION:  PT End of Session - 01/04/23 1100     Visit Number 6    Number of Visits 13    Date for PT Re-Evaluation 02/07/23    Authorization Type UHC Medicare    PT Start Time 1100    PT Stop Time 8828    PT Time Calculation (min) 43 min    Activity Tolerance Patient tolerated treatment well    Behavior During Therapy WFL for tasks assessed/performed             Past Medical History:  Diagnosis Date   AAA (abdominal aortic aneurysm) ([place])    a. s/p stent grafting in 2009.   Adenomatous colon polyp 05/2001   Adenomatous duodenal polyp    Allergy    Anginal pain (HCC)    COPD (chronic obstructive pulmonary disease) ([place])    pt denies COPD   Coronary artery disease    a. s/p CABG in 1995;  b. 05/2013 Neg MV, EF 82%;  c. 06/2013 Cath: LM nl, LAD 40-50p, LCX nl, RCA 80p/173m VG->RCA->PDA->PLSA min irregs, LIMA->LAD atretic, vigorous LV fxn.   Diabetes mellitus without complication (HCC)    history of, resolved. 2017   Diverticulosis    Eczema, dyshidrotic    Eczema, dyshidrotic 1986   Dr. HMathis Fare  Emphysema [facility] lung ([facility]    Fatty liver    Fibromyalgia    GERD (gastroesophageal reflux disease)    Hyperlipidemia    Hypertension    Migraine    Myocardial infarction ([facility]    Osteoarthritis    Osteoporosis    unsure, having a bone scan soon   [place] ([place]    left leg diagnosed by Dr BMare Ferrari  Renal artery stenosis ([facility]    a. 2008 s/p PTA   Past Surgical History:  Procedure Laterality Date   ABDOMINAL AORTAGRAM N/A 03/06/2014   Procedure: ABDOMINAL AORTAGRAM;  Surgeon: TRosetta Posner MD;  [facility];  Service: Cardiovascular;  Laterality: N/A;   ABDOMINAL AORTIC ANEURYSM REPAIR  2009   stenting   ABDOMINAL  HYSTERECTOMY     Total, 1992   BREAST BIOPSY     CARDIAC CATHETERIZATION     CARDIOVASCULAR STRESS TEST  11/11/2009   normal study   CARPAL TUNNEL RELEASE  08/1993   left wrist   CARPAL TUNNEL RELEASE  01/1997   right   CATARACT EXTRACTION, BILATERAL  2021   CORONARY ARTERY BYPASS GRAFT  07/1994   Triple by Dr ECeasar Mons  CORONARY STENT INTERVENTION N/A 02/02/2021   Procedure: CORONARY STENT INTERVENTION;  Surgeon: ENelva Bush MD;  [place];  Service: Cardiovascular;  Laterality: N/A;   CORONARY STENT INTERVENTION N/A 06/26/2021   Procedure: CORONARY STENT INTERVENTION;  Surgeon: CSherren Mocha MD;  [place];  Service: Cardiovascular;  Laterality: N/A;   dental implants     EYE SURGERY  03/2020   cataract   HAND SURGERY Right 09/2002   Right hand pulley release   INTRAVASCULAR ULTRASOUND/IVUS N/A 06/26/2021   Procedure: Intravascular Ultrasound/IVUS;  Surgeon: CSherren Mocha MD;  [place];  Service: Cardiovascular;  Laterality: N/A;   INTRAVASCULAR ULTRASOUND/IVUS N/A 09/03/2021   Procedure: Intravascular Ultrasound/IVUS;  Surgeon: TLenna Sciara  K, MD;  Location: [place] CV LAB;  Service: Cardiovascular;  Laterality: N/A;   LAMINECTOMY WITH POSTERIOR LATERAL ARTHRODESIS LEVEL 1 N/A 03/24/2022   Procedure: LAMINECTOMY, FORAMINOTOMY, LUMBAR TWO- THREE ; INTERTRANSVERSE FUSION LUMBAR TWO - LUMBAR THREE RIGHT;  Surgeon: Eustace Moore, MD;  Location: [place];  Service: Neurosurgery;  Laterality: N/A;   LEFT HEART CATH AND CORONARY ANGIOGRAPHY N/A 06/26/2021   Procedure: LEFT HEART CATH AND CORONARY ANGIOGRAPHY;  Surgeon: Sherren Mocha, MD;  Location: [place] CV LAB;  Service: Cardiovascular;  Laterality: N/A;   LEFT HEART CATH AND CORS/GRAFTS ANGIOGRAPHY N/A 02/02/2021   Procedure: LEFT HEART CATH AND CORS/GRAFTS ANGIOGRAPHY;  Surgeon: Nelva Bush, MD;  Location: [place] CV LAB;  Service: Cardiovascular;   Laterality: N/A;   LEFT HEART CATH AND CORS/GRAFTS ANGIOGRAPHY N/A 09/03/2021   Procedure: LEFT HEART CATH AND CORS/GRAFTS ANGIOGRAPHY;  Surgeon: Early Osmond, MD;  Location: [place] CV LAB;  Service: Cardiovascular;  Laterality: N/A;   LEFT HEART CATH AND CORS/GRAFTS ANGIOGRAPHY N/A 07/15/2022   Procedure: LEFT HEART CATH AND CORS/GRAFTS ANGIOGRAPHY;  Surgeon: Martinique, Peter M, MD;  Location: [place] CV LAB;  Service: Cardiovascular;  Laterality: N/A;   LEFT HEART CATHETERIZATION WITH CORONARY/GRAFT ANGIOGRAM N/A 06/11/2013   Procedure: LEFT HEART CATHETERIZATION WITH [name];  Surgeon: Thayer Headings, MD;  Location: [facility];  Service: Cardiovascular;  Laterality: N/A;   MULTIPLE TOOTH EXTRACTIONS  2000   All upper teeth.  Has full dneture.    RENAL ARTERY STENT  2008   SHOULDER ARTHROSCOPY WITH ROTATOR CUFF REPAIR Right 09/27/2018   Procedure: Right shoulder mini open rotator cuff repair;  Surgeon: Susa Day, MD;  [facility];  Service: Orthopedics;  Laterality: Right;  90 mins   thyroid cyst apiration  05/1997 and 07/1998   TONSILECTOMY, ADENOIDECTOMY, BILATERAL MYRINGOTOMY AND TUBES  1989   TONSILLECTOMY     TOTAL ABDOMINAL HYSTERECTOMY  1992   Patient Active Problem List   Diagnosis Date Noted   Senile purpura ([place]) 12/22/2022   Daily consumption of alcohol 12/22/2022   S/P lumbar laminectomy 03/24/2022   Thyroid nodule greater than or equal to 1.5 cm in diameter incidentally noted on imaging study 08/27/2021   Angina pectoris ([place]) 06/25/2021   History of compression fracture of spine - lumbar 06/19/2021   History of lumbosacral spine surgery 03/03/2021   NSTEMI (non-ST elevated myocardial infarction) ([place]) 2022 01/31/2021   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/26/2021   Neuropathic pain 12/05/2020   Gait instability 09/09/2020   Lumbar stenosis without neurogenic claudication 02/26/2020   COPD with chronic bronchitis 09/28/2018    Essential (primary) hypertension 07/10/2018   IFG (impaired fasting glucose) 11/10/2017   DJD (degenerative joint disease), lumbosacral 11/10/2017   Fibromyalgia 11/10/2017   Dyshidrotic eczema 11/10/2017   GERD (gastroesophageal reflux disease) 11/10/2017   Osteopenia 11/10/2017   Does use hearing aid 11/10/2017   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   Fatty liver 11/10/2017   Coronary artery disease    Peripheral vascular disease with claudication ([place])    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   History of abdominal aortic aneurysm repair 05/18/2011   History of renal artery stenosis 05/18/2011   Hyperlipidemia LDL goal <70     ONSET DATE: 12/07/2022  REFERRING DIAG: M48.061 (ICD-10-CM) - Spinal stenosis, lumbar region without neurogenic claudication  THERAPY DIAG:  Unsteadiness on feet  Other abnormalities of gait and mobility  Muscle weakness (generalized)  Other lack of coordination  Dizziness and giddiness  Rationale for Evaluation and Treatment: Rehabilitation  SUBJECTIVE:                                                                                                                                                                                             SUBJECTIVE STATEMENT: Patient reports doing well- does still have same dizziness- reporting it feel as though she's "tipsy." Denies falls/near falls.   Pt accompanied by: self  PERTINENT HISTORY: PVD, MNG,COPD and CAD;  Lumbar Spinal Surgery 03/24/2022 1.  Decompressive lumbar laminectomy, medial facetectomy and foraminotomies L2-3 bilaterally, 2.  Right intertransverse arthrodesis L2-3 utilizing locally harvested morselized autologous bone graft and morselized allograft  PAIN:  Are you having pain? Yes: NPRS scale: 2/10 Pain location: B hips due to bursitis,  Pain description: constant (chronic pain) Aggravating factors: N/A Relieving factors: pain medication  PRECAUTIONS: Fall  PATIENT GOALS: "improve my balance and  increase my core strength"  TODAY'S TREATMENT:   Positional Testing:  -dix hallpike 1st trial: R posterior canal canalithiasis with 15s latency and large amplitude R torsional upbeating nystagmus lasting ~6s  Epley x1: symptoms improved with no reported dizziness sitting up -dix hallpike 2nd trial: no nystagmus, no dizziness  NMR:  -HEP (see below)  -anti rotation seated with blue theraband      PATIENT EDUCATION: Education details: HEP  Person educated: Patient Education method: Customer service manager Education comprehension: verbalized understanding, returned demonstration, and needs further education  HOME EXERCISE PROGRAM: Access Code: [email or web address] URL: [email or web address] Date: 01/04/2023 Prepared by: Estevan Ryder  Exercises - Sit to Stand with Arms Crossed  - 1 x daily - 7 x weekly - 3 sets - 10 reps - Side Stepping with Counter Support  - 1 x daily - 7 x weekly - 3 sets - 10 reps - Mini Squat with Counter Support  - 1 x daily - 7 x weekly - 3 sets - 10 reps - Standing Tandem Balance with Counter Support  - 1 x daily - 7 x weekly - 3 sets - 30s hold - Heel Raises with Counter Support  - 1 x daily - 7 x weekly - 3 sets - 10 reps -infor on self epley    GOALS: Goals reviewed with patient? Yes  SHORT TERM GOALS: Target date: 01/03/2023    Pt will be independent with initial HEP for improved strength, balance, transfers and gait. Baseline: Goal status: INITIAL  2.  Pt will improve FGA to 22/30 for decreased fall risk  Baseline: 18/30 (1/8) Goal status: INITIAL  3.  Pt to improve time on Condition 4 of mCTSIB by >/= 5 sec to demonstrate  improved balance and decreased fall risk Baseline: 9.6 sec (1/8) Goal status: INITIAL   LONG TERM GOALS: Target date: 01/27/2023   Pt will be independent with final HEP for improved strength, balance, transfers and gait and be able to state plan for exercise post-discharge in a community setting Baseline:   Goal status: INITIAL  2.  Pt will improve FGA to 24/30 for decreased fall risk  Baseline: 18/30 (1/8) Goal status: INITIAL  3.  Pt to improve time on Condition 4 of mCTSIB by >/= 10 sec to demonstrate improved balance and decreased fall risk Baseline: 9.6 sec (1/8) Goal status: INITIAL  4.  Pt will improve gait velocity to at least 3.5 ft/sec for improved gait efficiency and performance at mod I level  Baseline: 3.0 ft/sec with no AD (1/8) Goal status: INITIAL  ASSESSMENT:  CLINICAL IMPRESSION: Patient seen for skilled PT session with emphasis on canalith repositioning and initiating HEP. R posterior canal BPPV resolved with 1 Epley this session- patient tolerating well. Initiated HEP for proximal and functional strengthening. Continue POC.    OBJECTIVE IMPAIRMENTS: Abnormal gait, decreased balance, decreased strength, dizziness, and impaired perceived functional ability.   ACTIVITY LIMITATIONS: carrying, lifting, bending, squatting, stairs, and bed mobility  PARTICIPATION LIMITATIONS: meal prep, driving, and community activity  PERSONAL FACTORS: Age, Time since onset of injury/illness/exacerbation, and 1-2 comorbidities:    PVD, MNG,COPD and CAD are also affecting patient's functional outcome.   REHAB POTENTIAL: Good  CLINICAL DECISION MAKING: Stable/uncomplicated  EVALUATION COMPLEXITY: Low  PLAN:  PT FREQUENCY: 2x/week  PT DURATION: 6 weeks  PLANNED INTERVENTIONS: Therapeutic exercises, Therapeutic activity, Neuromuscular re-education, Balance training, Gait training, Patient/Family education, Self Care, Joint mobilization, Stair training, Vestibular training, Canalith repositioning, Visual/preceptual remediation/compensation, DME instructions, Dry Needling, Cryotherapy, Moist heat, Taping, Manual therapy, and Re-evaluation  PLAN FOR NEXT SESSION: recheck R Posterior BPPV and possible L anterior canal BPPV, HEP for habituation?, CHECK GOALS  Debbora Dus, PT, DPT,  CBIS 01/04/2023, 11:49 AM

## 2023-01-06 ENCOUNTER — Ambulatory Visit: Payer: Medicare Other | Attending: Neurological Surgery

## 2023-01-06 ENCOUNTER — Telehealth: Payer: Self-pay | Admitting: Diagnostic Neuroimaging

## 2023-01-06 ENCOUNTER — Telehealth: Payer: Self-pay

## 2023-01-06 DIAGNOSIS — R278 Other lack of coordination: Secondary | ICD-10-CM | POA: Insufficient documentation

## 2023-01-06 DIAGNOSIS — R2689 Other abnormalities of gait and mobility: Secondary | ICD-10-CM | POA: Insufficient documentation

## 2023-01-06 DIAGNOSIS — R2681 Unsteadiness on feet: Secondary | ICD-10-CM | POA: Insufficient documentation

## 2023-01-06 DIAGNOSIS — R42 Dizziness and giddiness: Secondary | ICD-10-CM | POA: Insufficient documentation

## 2023-01-06 DIAGNOSIS — M6281 Muscle weakness (generalized): Secondary | ICD-10-CM | POA: Insufficient documentation

## 2023-01-06 NOTE — Therapy (Signed)
OUTPATIENT PHYSICAL THERAPY NEURO TREATMENT   Patient Name: Megan [name] MRN: [id number] DOB:[birth year], 77 y.o., female Today's Date: 01/06/2023   PCP: [name], MD REFERRING PROVIDER: Eustace Moore, MD  END OF SESSION:  PT End of Session - 01/06/23 1102     Visit Number 7    Number of Visits 13    Date for PT Re-Evaluation 02/07/23    Authorization Type [employer] Medicare    PT Start Time 1100    PT Stop Time 1140    PT Time Calculation (min) 40 min    Activity Tolerance Patient tolerated treatment well    Behavior During Therapy WFL for tasks assessed/performed             Past Medical History:  Diagnosis Date   AAA (abdominal aortic aneurysm) ([place])    a. s/p stent grafting in 2009.   Adenomatous colon polyp 05/2001   Adenomatous duodenal polyp    Allergy    Anginal pain (HCC)    COPD (chronic obstructive pulmonary disease) ([place])    pt denies COPD   Coronary artery disease    a. s/p CABG in 1995;  b. 05/2013 Neg MV, EF 82%;  c. 06/2013 Cath: LM nl, LAD 40-50p, LCX nl, RCA 80p/15m VG->RCA->PDA->PLSA min irregs, LIMA->LAD atretic, vigorous LV fxn.   Diabetes mellitus without complication (HCC)    history of, resolved. 2017   Diverticulosis    Eczema, dyshidrotic    Eczema, dyshidrotic 1986   Dr. HMathis Fare  Emphysema of lung ([facility]    Fatty liver    Fibromyalgia    GERD (gastroesophageal reflux disease)    Hyperlipidemia    Hypertension    Migraine    Myocardial infarction ([facility]    Osteoarthritis    Osteoporosis    unsure, having a bone scan soon   [place]ipheral arterial disease [place]    left leg diagnosed by Dr BMare Ferrari  Renal artery stenosis ([facility]    a. 2008 s/p PTA   Past Surgical History:  Procedure Laterality Date   ABDOMINAL AORTAGRAM N/A 03/06/2014   Procedure: ABDOMINAL AORTAGRAM;  Surgeon: TRosetta Posner MD;  [facility];  Service: Cardiovascular;  Laterality: N/A;   ABDOMINAL AORTIC ANEURYSM REPAIR  2009   stenting   ABDOMINAL  HYSTERECTOMY     Total, 1992   BREAST BIOPSY     CARDIAC CATHETERIZATION     CARDIOVASCULAR STRESS TEST  11/11/2009   normal study   CARPAL TUNNEL RELEASE  08/1993   left wrist   CARPAL TUNNEL RELEASE  01/1997   right   CATARACT EXTRACTION, BILATERAL  2021   CORONARY ARTERY BYPASS GRAFT  07/1994   Triple by Dr ECeasar Mons  CORONARY STENT INTERVENTION N/A 02/02/2021   Procedure: CORONARY STENT INTERVENTION;  Surgeon: ENelva Bush MD;  [place];  Service: Cardiovascular;  Laterality: N/A;   CORONARY STENT INTERVENTION N/A 06/26/2021   Procedure: CORONARY STENT INTERVENTION;  Surgeon: CSherren Mocha MD;  [place] [facility];  Service: Cardiovascular;  Laterality: N/A;   dental implants     EYE SURGERY  03/2020   cataract   HAND SURGERY Right 09/2002   Right hand pulley release   INTRAVASCULAR ULTRASOUND/IVUS N/A 06/26/2021   Procedure: Intravascular Ultrasound/IVUS;  Surgeon: CSherren Mocha MD;  [place];  Service: Cardiovascular;  Laterality: N/A;   INTRAVASCULAR ULTRASOUND/IVUS N/A 09/03/2021   Procedure: Intravascular Ultrasound/IVUS;  Surgeon: TLenna Sciara  K, MD;  Location: [place] CV LAB;  Service: Cardiovascular;  Laterality: N/A;   LAMINECTOMY WITH POSTERIOR LATERAL ARTHRODESIS LEVEL 1 N/A 03/24/2022   Procedure: LAMINECTOMY, FORAMINOTOMY, LUMBAR TWO- THREE ; INTERTRANSVERSE FUSION LUMBAR TWO - LUMBAR THREE RIGHT;  Surgeon: Eustace Moore, MD;  Location: [place];  Service: Neurosurgery;  Laterality: N/A;   LEFT HEART CATH AND CORONARY ANGIOGRAPHY N/A 06/26/2021   Procedure: LEFT HEART CATH AND CORONARY ANGIOGRAPHY;  Surgeon: Sherren Mocha, MD;  Location: [place] CV LAB;  Service: Cardiovascular;  Laterality: N/A;   LEFT HEART CATH AND CORS/GRAFTS ANGIOGRAPHY N/A 02/02/2021   Procedure: LEFT HEART CATH AND CORS/GRAFTS ANGIOGRAPHY;  Surgeon: Nelva Bush, MD;  Location: [place] CV LAB;  Service: Cardiovascular;   Laterality: N/A;   LEFT HEART CATH AND CORS/GRAFTS ANGIOGRAPHY N/A 09/03/2021   Procedure: LEFT HEART CATH AND CORS/GRAFTS ANGIOGRAPHY;  Surgeon: Early Osmond, MD;  Location: [place] CV LAB;  Service: Cardiovascular;  Laterality: N/A;   LEFT HEART CATH AND CORS/GRAFTS ANGIOGRAPHY N/A 07/15/2022   Procedure: LEFT HEART CATH AND CORS/GRAFTS ANGIOGRAPHY;  Surgeon: Martinique, Peter M, MD;  Location: [place] CV LAB;  Service: Cardiovascular;  Laterality: N/A;   LEFT HEART CATHETERIZATION WITH CORONARY/GRAFT ANGIOGRAM N/A 06/11/2013   Procedure: LEFT HEART CATHETERIZATION WITH [name];  Surgeon: Thayer Headings, MD;  Location: [facility] CATH LAB;  Service: Cardiovascular;  Laterality: N/A;   MULTIPLE TOOTH EXTRACTIONS  2000   All upper teeth.  Has full dneture.    RENAL ARTERY STENT  2008   SHOULDER ARTHROSCOPY WITH ROTATOR CUFF REPAIR Right 09/27/2018   Procedure: Right shoulder mini open rotator cuff repair;  Surgeon: Susa Day, MD;  Location: [facility];  Service: Orthopedics;  Laterality: Right;  90 mins   thyroid cyst apiration  05/1997 and 07/1998   TONSILECTOMY, ADENOIDECTOMY, BILATERAL MYRINGOTOMY AND TUBES  1989   TONSILLECTOMY     TOTAL ABDOMINAL HYSTERECTOMY  1992   Patient Active Problem List   Diagnosis Date Noted   Senile purpura ([place]) 12/22/2022   Daily consumption of alcohol 12/22/2022   S/P lumbar laminectomy 03/24/2022   Thyroid nodule greater than or equal to 1.5 cm in diameter incidentally noted on imaging study 08/27/2021   Angina pectoris ([place]) 06/25/2021   History of compression fracture of spine - lumbar 06/19/2021   History of lumbosacral spine surgery 03/03/2021   NSTEMI (non-ST elevated myocardial infarction) ([place]) 2022 01/31/2021   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/26/2021   Neuropathic pain 12/05/2020   Gait instability 09/09/2020   Lumbar stenosis without neurogenic claudication 02/26/2020   COPD with chronic bronchitis 09/28/2018    Essential (primary) hypertension 07/10/2018   IFG (impaired fasting glucose) 11/10/2017   DJD (degenerative joint disease), lumbosacral 11/10/2017   Fibromyalgia 11/10/2017   Dyshidrotic eczema 11/10/2017   GERD (gastroesophageal reflux disease) 11/10/2017   Osteopenia 11/10/2017   Does use hearing aid 11/10/2017   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   Fatty liver 11/10/2017   Coronary artery disease    Peripheral vascular disease with claudication ([place])    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   History of abdominal aortic aneurysm repair 05/18/2011   History of renal artery stenosis 05/18/2011   Hyperlipidemia LDL goal <70     ONSET DATE: 12/07/2022  REFERRING DIAG: M48.061 (ICD-10-CM) - Spinal stenosis, lumbar region without neurogenic claudication  THERAPY DIAG:  Unsteadiness on feet  Other abnormalities of gait and mobility  Other lack of coordination  Muscle weakness (generalized)  Dizziness and giddiness  Rationale for Evaluation and Treatment: Rehabilitation  SUBJECTIVE:                                                                                                                                                                                             SUBJECTIVE STATEMENT: Patient reports doing well. Dizziness is a little better, but also relatively the same. Exercises did bother her toe a little, but manageable. Denies falls/near falls.   Pt accompanied by: self  PERTINENT HISTORY: PVD, MNG,COPD and CAD;  Lumbar Spinal Surgery 03/24/2022 1.  Decompressive lumbar laminectomy, medial facetectomy and foraminotomies L2-3 bilaterally, 2.  Right intertransverse arthrodesis L2-3 utilizing locally harvested morselized autologous bone graft and morselized allograft  PAIN:  Are you having pain? Yes: NPRS scale: 2/10 Pain location: B hips due to bursitis,  Pain description: constant (chronic pain) Aggravating factors: N/A Relieving factors: pain medication  PRECAUTIONS:  Fall  PATIENT GOALS: "improve my balance and increase my core strength"  TODAY'S TREATMENT:   Positional Testing:  -dix hallpike 1st trial: R posterior canal canalithiasis with 15s latency and small amplitude nystagmus lasting ~3s  Epley x1: symptoms improved with no reported dizziness sitting up -dix hallpike 2nd trial: R posterior canal canalithiasis with 6s latency and mild amplitude nystagmus lasting ~6s  Epley x2: symptoms improved with mild dizziness when sitting up  -Dix hallpike 3rd trial: R posterior canal canalithiasis with 5s latency and small amplitude nystagmus lasting ~5s   -PT added gentle vibration to R mastoid process to assist in moving otoconia, however, in doing so- patient demonstrated pure downbeating nystagmus B and reported diplopia  -testing/treatment aborted as this PT is unfamiliar with this response    [facility] PT Assessment - 01/06/23 0001       Functional Gait  Assessment   Gait assessed  Yes    Gait Level Surface Walks 20 ft in less than 7 sec but greater than 5.5 sec, uses assistive device, slower speed, mild gait deviations, or deviates 6-10 in outside of the 12 in walkway width.    Change in Gait Speed Makes only minor adjustments to walking speed, or accomplishes a change in speed with significant gait deviations, deviates 10-15 in outside the 12 in walkway width, or changes speed but loses balance but is able to recover and continue walking.    Gait with Horizontal Head Turns Performs head turns with moderate changes in gait velocity, slows down, deviates 10-15 in outside 12 in walkway width but recovers, can continue to walk.    Gait with Vertical Head Turns Performs task with moderate change in gait velocity, slows down, deviates 10-15 in  outside 12 in walkway width but recovers, can continue to walk.    Gait and Pivot Turn Pivot turns safely within 3 sec and stops quickly with no loss of balance.    Step Over Obstacle Is able to step over one shoe box (4.5  in total height) without changing gait speed. No evidence of imbalance.    Gait with Narrow Base of Support Ambulates 4-7 steps.    Gait with Eyes Closed Walks 20 ft, slow speed, abnormal gait pattern, evidence for imbalance, deviates 10-15 in outside 12 in walkway width. Requires more than 9 sec to ambulate 20 ft.    Ambulating Backwards Walks 20 ft, uses assistive device, slower speed, mild gait deviations, deviates 6-10 in outside 12 in walkway width.    Steps Alternating feet, must use rail.    Total Score 16            M-CTSIB:  Condition 4: 6.5s    PATIENT EDUCATION: Education details: benefit from further imaging to r/o central etiology to her dizziness, exam results Person educated: Patient Education method: Explanation and Demonstration Education comprehension: verbalized understanding, returned demonstration, and needs further education  HOME EXERCISE PROGRAM: Access Code: [email or web address] URL: [email or web address] Date: 01/04/2023 Prepared by: Estevan Ryder  Exercises - Sit to Stand with Arms Crossed  - 1 x daily - 7 x weekly - 3 sets - 10 reps - Side Stepping with Counter Support  - 1 x daily - 7 x weekly - 3 sets - 10 reps - Mini Squat with Counter Support  - 1 x daily - 7 x weekly - 3 sets - 10 reps - Standing Tandem Balance with Counter Support  - 1 x daily - 7 x weekly - 3 sets - 30s hold - Heel Raises with Counter Support  - 1 x daily - 7 x weekly - 3 sets - 10 reps -infor on self epley    GOALS: Goals reviewed with patient? Yes  SHORT TERM GOALS: Target date: 01/03/2023    Pt will be independent with initial HEP for improved strength, balance, transfers and gait. Baseline:provided Goal status: MET  2.  Pt will improve FGA to 22/30 for decreased fall risk  Baseline: 18/30 (1/8); 16/30 Goal status: NOT MET  3.  Pt to improve time on Condition 4 of mCTSIB by >/= 5 sec to demonstrate improved balance and decreased fall risk Baseline: 9.6 sec  (1/8); 6.5s  Goal status: NOT MET   LONG TERM GOALS: Target date: 01/27/2023   Pt will be independent with final HEP for improved strength, balance, transfers and gait and be able to state plan for exercise post-discharge in a community setting Baseline:  Goal status: INITIAL  2.  Pt will improve FGA to 24/30 for decreased fall risk  Baseline: 18/30 (1/8) Goal status: INITIAL  3.  Pt to improve time on Condition 4 of mCTSIB by >/= 10 sec to demonstrate improved balance and decreased fall risk Baseline: 9.6 sec (1/8) Goal status: INITIAL  4.  Pt will improve gait velocity to at least 3.5 ft/sec for improved gait efficiency and performance at mod I level  Baseline: 3.0 ft/sec with no AD (1/8) Goal status: INITIAL  ASSESSMENT:  CLINICAL IMPRESSION: Patient seen for skilled PT session with emphasis on attempting canalith repositioning and goal assessment. She met 1/3 STG. Patient demonstrates increased fall risk as noted by score of 16/30 on  Functional Gait Assessment.   <22/30 = predictive of falls, <20/30 = fall in 6  months, <18/30 = predictive of falls in PD MCID: 5 points stroke population, 4 points geriatric population (ANPTA Core Set of Outcome Measures for Adults with Neurologic Conditions, 2018). She was only able to maintain condition 4 of M-CTSIB for 6.5s, down from 9.6s previously. The worsening of her scores could be contributed to not being able to address her balance impairment directly thus far due to her persistent BPPV, or could be related to underlying central pathology. Patients response to adding vibration during Epley maneuver (pure downbeating nystagmus) and diplopia is indicative of something of a central etiology. Discussed with patient and she verbalized understanding. PT notified MD of findings and that she may benefit from f/u imaging. Continue POC as able.    OBJECTIVE IMPAIRMENTS: Abnormal gait, decreased balance, decreased strength, dizziness, and impaired  perceived functional ability.   ACTIVITY LIMITATIONS: carrying, lifting, bending, squatting, stairs, and bed mobility  PARTICIPATION LIMITATIONS: meal prep, driving, and community activity  PERSONAL FACTORS: Age, Time since onset of injury/illness/exacerbation, and 1-2 comorbidities:    PVD, MNG,COPD and CAD are also affecting patient's functional outcome.   REHAB POTENTIAL: Good  CLINICAL DECISION MAKING: Stable/uncomplicated  EVALUATION COMPLEXITY: Low  PLAN:  PT FREQUENCY: 2x/week  PT DURATION: 6 weeks  PLANNED INTERVENTIONS: Therapeutic exercises, Therapeutic activity, Neuromuscular re-education, Balance training, Gait training, Patient/Family education, Self Care, Joint mobilization, Stair training, Vestibular training, Canalith repositioning, Visual/preceptual remediation/compensation, DME instructions, Dry Needling, Cryotherapy, Moist heat, Taping, Manual therapy, and Re-evaluation  PLAN FOR NEXT SESSION: heard from MD?  Debbora Dus, PT, DPT, CBIS 01/06/2023, 11:58 AM

## 2023-01-06 NOTE — Telephone Encounter (Signed)
Pt called stating that her P.T. next door recommended for her to get an MRI done. Pt scheduled an appt but would like to discuss with the RN. Please advise.

## 2023-01-06 NOTE — Telephone Encounter (Signed)
Contacted pt back, advised her PT has sent a message to Dr Leta Baptist about the concerns already and he advised for her to come into the office to be seen. She is scheduled with Amy Lomax.

## 2023-01-06 NOTE — Telephone Encounter (Signed)
Dr. Jonni Sanger, Daisy Floro is being treated by PT for BPPV and imbalance. Today, she presented with downbeating nystagmus (as opposed to her typical R torsional upbeating nystagmus), which is indicative of something of central origin. She may benefit from further brain imaging to assess for central etiology for her dizziness and imbalance. If you agree, please place an order for further imaging.   Thank you, Debbora Dus, PT, DPT, [facility] [address] [place], [place]  [address] Phone:  [phone] Fax:  [phone]

## 2023-01-06 NOTE — Telephone Encounter (Signed)
  Dr. Leta Baptist, Megan [name] is being treated by PT for BPPV and imbalance. Today, she presented with downbeating nystagmus (as opposed to her typical R torsional upbeating nystagmus), which is indicative of something of central origin. She may benefit from further brain imaging to assess for central etiology for her dizziness and imbalance. If you agree, please place an order for further imaging.    Thank you, Debbora Dus, PT, DPT, CBIS

## 2023-01-10 ENCOUNTER — Encounter: Payer: Self-pay | Admitting: Cardiology

## 2023-01-10 ENCOUNTER — Ambulatory Visit
Admission: RE | Admit: 2023-01-10 | Discharge: 2023-01-10 | Disposition: A | Discharge status: Home / Self Care | Payer: Medicare Other | Source: Ambulatory Visit | Attending: Family Medicine | Admitting: Family Medicine

## 2023-01-10 DIAGNOSIS — Z1231 Encounter for screening mammogram for malignant neoplasm of breast: Secondary | ICD-10-CM

## 2023-01-10 NOTE — Telephone Encounter (Signed)
Pt has been scheduled to see MD on the 26th of Feb @ 2:30pm checking in at 2

## 2023-01-11 ENCOUNTER — Ambulatory Visit: Payer: Medicare Other

## 2023-01-11 ENCOUNTER — Telehealth: Payer: Self-pay | Admitting: Family Medicine

## 2023-01-11 ENCOUNTER — Other Ambulatory Visit: Payer: Self-pay | Admitting: *Deleted

## 2023-01-11 DIAGNOSIS — R278 Other lack of coordination: Secondary | ICD-10-CM | POA: Diagnosis not present

## 2023-01-11 DIAGNOSIS — M6281 Muscle weakness (generalized): Secondary | ICD-10-CM

## 2023-01-11 DIAGNOSIS — R42 Dizziness and giddiness: Secondary | ICD-10-CM

## 2023-01-11 DIAGNOSIS — R2689 Other abnormalities of gait and mobility: Secondary | ICD-10-CM | POA: Diagnosis not present

## 2023-01-11 DIAGNOSIS — Z95828 Presence of other vascular implants and grafts: Secondary | ICD-10-CM

## 2023-01-11 DIAGNOSIS — R2681 Unsteadiness on feet: Secondary | ICD-10-CM | POA: Diagnosis not present

## 2023-01-11 NOTE — Therapy (Signed)
OUTPATIENT PHYSICAL THERAPY NEURO TREATMENT   Patient Name: Megan [name] MRN: [id number] DOB:25-Jun-1946, 77 y.o., female 73 Date: 01/11/2023   PCP: [name], MD REFERRING PROVIDER: Eustace Moore, MD  END OF SESSION:  PT End of Session - 01/11/23 1057     Visit Number 8    Number of Visits 13    Date for PT Re-Evaluation 02/07/23    Authorization Type [employer] Medicare    PT Start Time 1100    PT Stop Time 0071   session discontinued due to central signs, patient placed on hold until after neuro appt   PT Time Calculation (min) 24 min    Activity Tolerance Patient tolerated treatment well    Behavior During Therapy [facility] for tasks assessed/performed             Past Medical History:  Diagnosis Date   AAA (abdominal aortic aneurysm) ([place])    a. s/p stent grafting in 2009.   Adenomatous colon polyp 05/2001   Adenomatous duodenal polyp    Allergy    Anginal pain (HCC)    COPD (chronic obstructive pulmonary disease) ([place])    pt denies COPD   Coronary artery disease    a. s/p CABG in 1995;  b. 05/2013 Neg MV, EF 82%;  c. 06/2013 Cath: LM nl, LAD 40-50p, LCX nl, RCA 80p/119m VG->RCA->PDA->PLSA min irregs, LIMA->LAD atretic, vigorous LV fxn.   Diabetes mellitus without complication (HCC)    history of, resolved. 2017   Diverticulosis    Eczema, dyshidrotic    Eczema, dyshidrotic 1986   Dr. HMathis Fare  Emphysema of lung ([facility]    Fatty liver    Fibromyalgia    GERD (gastroesophageal reflux disease)    Hyperlipidemia    Hypertension    Migraine    Myocardial infarction ([facility]    Osteoarthritis    Osteoporosis    unsure, having a bone scan soon   [place]    left leg diagnosed by Dr BMare Ferrari  Renal artery stenosis ([facility]    a. 2008 s/p PTA   Past Surgical History:  Procedure Laterality Date   ABDOMINAL AORTAGRAM N/A 03/06/2014   Procedure: ABDOMINAL AORTAGRAM;  Surgeon: TRosetta Posner MD;  [facility];  Service:  Cardiovascular;  Laterality: N/A;   ABDOMINAL AORTIC ANEURYSM REPAIR  2009   stenting   ABDOMINAL HYSTERECTOMY     Total, 1992   BREAST BIOPSY     CARDIAC CATHETERIZATION     CARDIOVASCULAR STRESS TEST  11/11/2009   normal study   CARPAL TUNNEL RELEASE  08/1993   left wrist   CARPAL TUNNEL RELEASE  01/1997   right   CATARACT EXTRACTION, BILATERAL  2021   CORONARY ARTERY BYPASS GRAFT  07/1994   Triple by Dr ECeasar Mons  CORONARY STENT INTERVENTION N/A 02/02/2021   Procedure: CORONARY STENT INTERVENTION;  Surgeon: ENelva Bush MD;  [facility];  Service: Cardiovascular;  Laterality: N/A;   CORONARY STENT INTERVENTION N/A 06/26/2021   Procedure: CORONARY STENT INTERVENTION;  Surgeon: CSherren Mocha MD;  [facility];  Service: Cardiovascular;  Laterality: N/A;   dental implants     EYE SURGERY  03/2020   cataract   HAND SURGERY Right 09/2002   Right hand pulley release   INTRAVASCULAR ULTRASOUND/IVUS N/A 06/26/2021   Procedure: Intravascular Ultrasound/IVUS;  Surgeon: CSherren Mocha MD;  [place] [facility];  Service: Cardiovascular;  Laterality: N/A;  INTRAVASCULAR ULTRASOUND/IVUS N/A 09/03/2021   Procedure: Intravascular Ultrasound/IVUS;  Surgeon: Early Osmond, MD;  Location: [place] CV LAB;  Service: Cardiovascular;  Laterality: N/A;   LAMINECTOMY WITH POSTERIOR LATERAL ARTHRODESIS LEVEL 1 N/A 03/24/2022   Procedure: LAMINECTOMY, FORAMINOTOMY, LUMBAR TWO- THREE ; INTERTRANSVERSE FUSION LUMBAR TWO - LUMBAR THREE RIGHT;  Surgeon: Eustace Moore, MD;  Location: [place];  Service: Neurosurgery;  Laterality: N/A;   LEFT HEART CATH AND CORONARY ANGIOGRAPHY N/A 06/26/2021   Procedure: LEFT HEART CATH AND CORONARY ANGIOGRAPHY;  Surgeon: Sherren Mocha, MD;  Location: [place] CV LAB;  Service: Cardiovascular;  Laterality: N/A;   LEFT HEART CATH AND CORS/GRAFTS ANGIOGRAPHY N/A 02/02/2021   Procedure: LEFT HEART CATH AND CORS/GRAFTS  ANGIOGRAPHY;  Surgeon: Nelva Bush, MD;  Location: [place] CV LAB;  Service: Cardiovascular;  Laterality: N/A;   LEFT HEART CATH AND CORS/GRAFTS ANGIOGRAPHY N/A 09/03/2021   Procedure: LEFT HEART CATH AND CORS/GRAFTS ANGIOGRAPHY;  Surgeon: Early Osmond, MD;  Location: [place] CV LAB;  Service: Cardiovascular;  Laterality: N/A;   LEFT HEART CATH AND CORS/GRAFTS ANGIOGRAPHY N/A 07/15/2022   Procedure: LEFT HEART CATH AND CORS/GRAFTS ANGIOGRAPHY;  Surgeon: Martinique, Peter M, MD;  Location: [place] CV LAB;  Service: Cardiovascular;  Laterality: N/A;   LEFT HEART CATHETERIZATION WITH CORONARY/GRAFT ANGIOGRAM N/A 06/11/2013   Procedure: LEFT HEART CATHETERIZATION WITH [name];  Surgeon: Thayer Headings, MD;  Location: [facility] CATH LAB;  Service: Cardiovascular;  Laterality: N/A;   MULTIPLE TOOTH EXTRACTIONS  2000   All upper teeth.  Has full dneture.    RENAL ARTERY STENT  2008   SHOULDER ARTHROSCOPY WITH ROTATOR CUFF REPAIR Right 09/27/2018   Procedure: Right shoulder mini open rotator cuff repair;  Surgeon: Susa Day, MD;  Location: [facility];  Service: Orthopedics;  Laterality: Right;  90 mins   thyroid cyst apiration  05/1997 and 07/1998   TONSILECTOMY, ADENOIDECTOMY, BILATERAL MYRINGOTOMY AND TUBES  1989   TONSILLECTOMY     TOTAL ABDOMINAL HYSTERECTOMY  1992   Patient Active Problem List   Diagnosis Date Noted   Senile purpura ([place]) 12/22/2022   Daily consumption of alcohol 12/22/2022   S/P lumbar laminectomy 03/24/2022   Thyroid nodule greater than or equal to 1.5 cm in diameter incidentally noted on imaging study 08/27/2021   Angina pectoris ([place]) 06/25/2021   History of compression fracture of spine - lumbar 06/19/2021   History of lumbosacral spine surgery 03/03/2021   NSTEMI (non-ST elevated myocardial infarction) ([place]) 2022 01/31/2021   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/26/2021   Neuropathic pain 12/05/2020   Gait instability 09/09/2020    Lumbar stenosis without neurogenic claudication 02/26/2020   COPD with chronic bronchitis 09/28/2018   Essential (primary) hypertension 07/10/2018   IFG (impaired fasting glucose) 11/10/2017   DJD (degenerative joint disease), lumbosacral 11/10/2017   Fibromyalgia 11/10/2017   Dyshidrotic eczema 11/10/2017   GERD (gastroesophageal reflux disease) 11/10/2017   Osteopenia 11/10/2017   Does use hearing aid 11/10/2017   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   Fatty liver 11/10/2017   Coronary artery disease    Peripheral vascular disease with claudication ([place])    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   History of abdominal aortic aneurysm repair 05/18/2011   History of renal artery stenosis 05/18/2011   Hyperlipidemia LDL goal <70     ONSET DATE: 12/07/2022  REFERRING DIAG: M48.061 (ICD-10-CM) - Spinal stenosis, lumbar region without neurogenic claudication  THERAPY DIAG:  Unsteadiness on feet  Other abnormalities of gait and mobility  Other lack of coordination  Muscle weakness (generalized)  Dizziness and giddiness  Rationale for Evaluation and Treatment: Rehabilitation  SUBJECTIVE:                                                                                                                                                                                             SUBJECTIVE STATEMENT: Patient reports doing the same. Does have more dizziness with going to lay down. Denies falls/near falls.   Pt accompanied by: self  PERTINENT HISTORY: PVD, MNG,COPD and CAD;  Lumbar Spinal Surgery 03/24/2022 1.  Decompressive lumbar laminectomy, medial facetectomy and foraminotomies L2-3 bilaterally, 2.  Right intertransverse arthrodesis L2-3 utilizing locally harvested morselized autologous bone graft and morselized allograft  PAIN:  Are you having pain? Yes: NPRS scale: 2/10 Pain location: B hips due to bursitis,  Pain description: constant (chronic pain) Aggravating factors: N/A Relieving  factors: pain medication  PRECAUTIONS: Fall  PATIENT GOALS: "improve my balance and increase my core strength"  TODAY'S TREATMENT:   Positional Testing:  -dix hallpike 1st trial: R posterior canal canalithiasis with 5s latency, R torsional downbeating nystagmus lasting 5-6 beats, but then converting to pure down beating nystagmus (observed and confirmed by 2nd PT)   -patient reporting vertical diplopia as well     PATIENT EDUCATION: Education details: exam results, going on hold and why  Person educated: Patient Education method: Customer service manager Education comprehension: verbalized understanding, returned demonstration, and needs further education  HOME EXERCISE PROGRAM: Access Code: [email or web address] URL: [email or web address] Date: 01/04/2023 Prepared by: Estevan Ryder  Exercises - Sit to Stand with Arms Crossed  - 1 x daily - 7 x weekly - 3 sets - 10 reps - Side Stepping with Counter Support  - 1 x daily - 7 x weekly - 3 sets - 10 reps - Mini Squat with Counter Support  - 1 x daily - 7 x weekly - 3 sets - 10 reps - Standing Tandem Balance with Counter Support  - 1 x daily - 7 x weekly - 3 sets - 30s hold - Heel Raises with Counter Support  - 1 x daily - 7 x weekly - 3 sets - 10 reps -infor on self epley    GOALS: Goals reviewed with patient? Yes  SHORT TERM GOALS: Target date: 01/03/2023    Pt will be independent with initial HEP for improved strength, balance, transfers and gait. Baseline:provided Goal status: MET  2.  Pt will improve FGA to 22/30 for decreased fall risk  Baseline: 18/30 (1/8); 16/30 Goal status: NOT MET  3.  Pt to improve time on Condition 4 of mCTSIB  by >/= 5 sec to demonstrate improved balance and decreased fall risk Baseline: 9.6 sec (1/8); 6.5s  Goal status: NOT MET   LONG TERM GOALS: Target date: 01/27/2023   Pt will be independent with final HEP for improved strength, balance, transfers and gait and be able to state  plan for exercise post-discharge in a community setting Baseline:  Goal status: INITIAL  2.  Pt will improve FGA to 24/30 for decreased fall risk  Baseline: 18/30 (1/8) Goal status: INITIAL  3.  Pt to improve time on Condition 4 of mCTSIB by >/= 10 sec to demonstrate improved balance and decreased fall risk Baseline: 9.6 sec (1/8) Goal status: INITIAL  4.  Pt will improve gait velocity to at least 3.5 ft/sec for improved gait efficiency and performance at mod I level  Baseline: 3.0 ft/sec with no AD (1/8) Goal status: INITIAL  ASSESSMENT:  CLINICAL IMPRESSION: Patient seen for skilled PT session with emphasis on reassessing BPPV. Patient presents again with down beating nystagmus, confirmed by 2nd PT. Discussed that therapy has not made an impact on her dizziness or her balance and thus will go on hold until after her neurologist appt to allow for further medical work up prior to continuing PT. Patient agreeable to this.    OBJECTIVE IMPAIRMENTS: Abnormal gait, decreased balance, decreased strength, dizziness, and impaired perceived functional ability.   ACTIVITY LIMITATIONS: carrying, lifting, bending, squatting, stairs, and bed mobility  PARTICIPATION LIMITATIONS: meal prep, driving, and community activity  PERSONAL FACTORS: Age, Time since onset of injury/illness/exacerbation, and 1-2 comorbidities:    PVD, MNG,COPD and CAD are also affecting patient's functional outcome.   REHAB POTENTIAL: Good  CLINICAL DECISION MAKING: Stable/uncomplicated  EVALUATION COMPLEXITY: Low  PLAN:  PT FREQUENCY: 2x/week  PT DURATION: 6 weeks  PLANNED INTERVENTIONS: Therapeutic exercises, Therapeutic activity, Neuromuscular re-education, Balance training, Gait training, Patient/Family education, Self Care, Joint mobilization, Stair training, Vestibular training, Canalith repositioning, Visual/preceptual remediation/compensation, DME instructions, Dry Needling, Cryotherapy, Moist heat, Taping,  Manual therapy, and Re-evaluation  PLAN FOR NEXT SESSION: results from MD?  Debbora Dus, PT, DPT, CBIS 01/11/2023, 11:46 AM

## 2023-01-11 NOTE — Telephone Encounter (Signed)
Copied from [place]. Topic: Medicare AWV >> Jan 11, 2023 11:23 AM Gillis Santa wrote: Reason for CRM: LVM PATIENT TO CALL [phone] TO SCHEDULE AWV [place]

## 2023-01-12 ENCOUNTER — Other Ambulatory Visit: Payer: Self-pay

## 2023-01-12 ENCOUNTER — Encounter: Payer: Self-pay | Admitting: Family Medicine

## 2023-01-12 DIAGNOSIS — R42 Dizziness and giddiness: Secondary | ICD-10-CM

## 2023-01-13 ENCOUNTER — Ambulatory Visit: Payer: Medicare Other | Admitting: Physical Therapy

## 2023-01-13 NOTE — Telephone Encounter (Signed)
Delete the message before this one. Pt declines appt.

## 2023-01-13 NOTE — Telephone Encounter (Signed)
Pt called back and would like for tina to call her to make appt. Please advise

## 2023-01-17 ENCOUNTER — Ambulatory Visit (HOSPITAL_COMMUNITY)
Admission: RE | Admit: 2023-01-17 | Discharge: 2023-01-17 | Disposition: A | Discharge status: Home / Self Care | Payer: Medicare Other | Source: Ambulatory Visit | Attending: Surgery | Admitting: Surgery

## 2023-01-17 ENCOUNTER — Other Ambulatory Visit: Payer: Self-pay | Admitting: Pulmonary Disease

## 2023-01-17 ENCOUNTER — Ambulatory Visit: Payer: Medicare Other | Admitting: Physician Assistant

## 2023-01-17 VITALS — BP 147/70 | HR 68 | Temp 97.5°F | Resp 20 | Ht 60.0 in | Wt 135.1 lb

## 2023-01-17 DIAGNOSIS — Z95828 Presence of other vascular implants and grafts: Secondary | ICD-10-CM | POA: Diagnosis not present

## 2023-01-17 MED ORDER — BREZTRI AEROSPHERE 160-9-4.8 MCG/ACT IN AERO: 2.0000 | INHALATION_SPRAY | Freq: Two times a day (BID) | RESPIRATORY_TRACT | 11 refills | Status: DC

## 2023-01-17 NOTE — Progress Notes (Signed)
[facility] [place] HISTORY AND PHYSICAL   History of Present Illness:  Patient is a 77 y.o. year old female who presents for evaluation of  abdominal aortic aneurysm that was repaired in 2009 endovascularly and also has been eventually with left common iliac artery stent. There is also a right renal artery stent.   She has chronic back pain.  She denise lumbar pain out of the ordinary, rest pain or non healing wounds.  She does have occasional calf cramping.  She states she eats mustard to help.    She is medically managed on ASA, Statin daily, and Effient.      Past Medical History:  Diagnosis Date   AAA (abdominal aortic aneurysm) ([place])    a. s/p stent grafting in 2009.   Adenomatous colon polyp 05/2001   Adenomatous duodenal polyp    Allergy    Anginal pain (HCC)    COPD (chronic obstructive pulmonary disease) ([place])    pt denies COPD   Coronary artery disease    a. s/p CABG in 1995;  b. 05/2013 Neg MV, EF 82%;  c. 06/2013 Cath: LM nl, LAD 40-50p, LCX nl, RCA 80p/145m VG->RCA->PDA->PLSA min irregs, LIMA->LAD atretic, vigorous LV fxn.   Diabetes mellitus without complication (HCC)    history of, resolved. 2017   Diverticulosis    Eczema, dyshidrotic    Eczema, dyshidrotic 1986   Dr. HMathis Fare  Emphysema of lung ([facility]    Fatty liver    Fibromyalgia    GERD (gastroesophageal reflux disease)    Hyperlipidemia    Hypertension    Migraine    Myocardial infarction ([facility]    Osteoarthritis    Osteoporosis    unsure, having a bone scan soon   Peripheral arterial disease ([place]    left leg diagnosed by Dr BMare Ferrari  Renal artery stenosis ([facility]    a. 2008 s/p PTA    Past Surgical History:  Procedure Laterality Date   ABDOMINAL AORTAGRAM N/A 03/06/2014   Procedure: ABDOMINAL AORTAGRAM;  Surgeon: TRosetta Posner MD;  [facility];  Service: Cardiovascular;  Laterality: N/A;   ABDOMINAL AORTIC ANEURYSM REPAIR  2009   stenting   ABDOMINAL  HYSTERECTOMY     Total, 1992   BREAST BIOPSY     CARDIAC CATHETERIZATION     CARDIOVASCULAR STRESS TEST  11/11/2009   normal study   CARPAL TUNNEL RELEASE  08/1993   left wrist   CARPAL TUNNEL RELEASE  01/1997   right   CATARACT EXTRACTION, BILATERAL  2021   CORONARY ARTERY BYPASS GRAFT  07/1994   Triple by Dr ECeasar Mons  CORONARY STENT INTERVENTION N/A 02/02/2021   Procedure: CORONARY STENT INTERVENTION;  Surgeon: ENelva Bush MD;  [place];  Service: Cardiovascular;  Laterality: N/A;   CORONARY STENT INTERVENTION N/A 06/26/2021   Procedure: CORONARY STENT INTERVENTION;  Surgeon: CSherren Mocha MD;  [place] [facility] [place];  Service: Cardiovascular;  Laterality: N/A;   dental implants     EYE SURGERY  03/2020   cataract   HAND SURGERY Right 09/2002   Right hand pulley release   INTRAVASCULAR ULTRASOUND/IVUS N/A 06/26/2021   Procedure: Intravascular Ultrasound/IVUS;  Surgeon: CSherren Mocha MD;  [place];  Service: Cardiovascular;  Laterality: N/A;   INTRAVASCULAR ULTRASOUND/IVUS N/A 09/03/2021   Procedure: Intravascular Ultrasound/IVUS;  Surgeon: TEarly Osmond MD;  [place];  Service: Cardiovascular;  Laterality: N/A;  LAMINECTOMY WITH POSTERIOR LATERAL ARTHRODESIS LEVEL 1 N/A 03/24/2022   Procedure: LAMINECTOMY, FORAMINOTOMY, LUMBAR TWO- THREE ; INTERTRANSVERSE FUSION LUMBAR TWO - LUMBAR THREE RIGHT;  Surgeon: Eustace Moore, MD;  Location: [place];  Service: Neurosurgery;  Laterality: N/A;   LEFT HEART CATH AND CORONARY ANGIOGRAPHY N/A 06/26/2021   Procedure: LEFT HEART CATH AND CORONARY ANGIOGRAPHY;  Surgeon: Sherren Mocha, MD;  Location: [place] CV LAB;  Service: Cardiovascular;  Laterality: N/A;   LEFT HEART CATH AND CORS/GRAFTS ANGIOGRAPHY N/A 02/02/2021   Procedure: LEFT HEART CATH AND CORS/GRAFTS ANGIOGRAPHY;  Surgeon: Nelva Bush, MD;  Location: [place] CV LAB;  Service: Cardiovascular;   Laterality: N/A;   LEFT HEART CATH AND CORS/GRAFTS ANGIOGRAPHY N/A 09/03/2021   Procedure: LEFT HEART CATH AND CORS/GRAFTS ANGIOGRAPHY;  Surgeon: Early Osmond, MD;  Location: [place] CV LAB;  Service: Cardiovascular;  Laterality: N/A;   LEFT HEART CATH AND CORS/GRAFTS ANGIOGRAPHY N/A 07/15/2022   Procedure: LEFT HEART CATH AND CORS/GRAFTS ANGIOGRAPHY;  Surgeon: Martinique, Peter M, MD;  Location: [place] CV LAB;  Service: Cardiovascular;  Laterality: N/A;   LEFT HEART CATHETERIZATION WITH CORONARY/GRAFT ANGIOGRAM N/A 06/11/2013   Procedure: LEFT HEART CATHETERIZATION WITH [name];  Surgeon: Thayer Headings, MD;  [facility];  Service: Cardiovascular;  Laterality: N/A;   MULTIPLE TOOTH EXTRACTIONS  2000   All upper teeth.  Has full dneture.    RENAL ARTERY STENT  2008   SHOULDER ARTHROSCOPY WITH ROTATOR CUFF REPAIR Right 09/27/2018   Procedure: Right shoulder mini open rotator cuff repair;  Surgeon: Susa Day, MD;  Location: [facility];  Service: Orthopedics;  Laterality: Right;  90 mins   thyroid cyst apiration  05/1997 and 07/1998   TONSILECTOMY, ADENOIDECTOMY, BILATERAL MYRINGOTOMY AND TUBES  1989   TONSILLECTOMY     TOTAL ABDOMINAL HYSTERECTOMY  1992    ROS:   General:  No weight loss, Fever, chills  HEENT: No recent headaches, no nasal bleeding, no visual changes, no sore throat  Neurologic: No dizziness, blackouts, seizures. No recent symptoms of stroke or mini- stroke. No recent episodes of slurred speech, or temporary blindness.  Cardiac: No recent episodes of chest pain/pressure, no shortness of breath at rest.  No shortness of breath with exertion.  Denies history of atrial fibrillation or irregular heartbeat  Vascular: No history of rest pain in feet.  No history of claudication.  No history of non-healing ulcer, No history of DVT   Pulmonary: No home oxygen, no productive cough, no hemoptysis,  No asthma or wheezing  Musculoskeletal:  [ ]$   Arthritis, [x ] Low back pain,  [ ]$  Joint pain  Hematologic:No history of hypercoagulable state.  No history of easy bleeding.  No history of anemia  Gastrointestinal: No hematochezia or melena,  No gastroesophageal reflux, no trouble swallowing  Urinary: [ ]$  chronic Kidney disease, [ ]$  on HD - [ ]$  MWF or [ ]$  TTHS, [ ]$  Burning with urination, [ ]$  Frequent urination, [ ]$  Difficulty urinating;   Skin: No rashes  Psychological: No history of anxiety,  No history of depression  Social History Social History   Tobacco Use   Smoking status: Former    Packs/day: 0.00    Years: 37.00    Total pack years: 0.00    Types: Cigarettes    Quit date: 12/07/1995    Years since quitting: 27.1    Passive exposure: Never   Smokeless tobacco: Never  Vaping Use   Vaping Use:  Never used  Substance Use Topics   Alcohol use: Yes    Alcohol/week: 6.0 - 10.0 standard drinks of alcohol    Types: 6 - 10 Glasses of wine per week    Comment: 2-3 glasses daily   Drug use: No    Family History Family History  Problem Relation Age of Onset   Heart disease Mother    Heart attack Mother    Heart disease Father        before age 60   Heart attack Father    Heart disease Sister        before age 21   Diabetes Sister    Hyperlipidemia Sister    Hypertension Sister    Heart attack Sister    Liver cancer Sister    Breast cancer Sister    Heart disease Brother    Diabetes Brother    Hyperlipidemia Brother    Hypertension Brother    Heart attack Brother    AAA (abdominal aortic aneurysm) Brother    Colon cancer Paternal Grandmother    Heart disease Brother    Colon cancer Cousin        first cousin on father's side   Pancreatic cancer Neg Hx    Rectal cancer Neg Hx    Stomach cancer Neg Hx    Esophageal cancer Neg Hx     Allergies  Allergies  Allergen Reactions   Brilinta [Ticagrelor] Shortness Of Breath   Bextra [Valdecoxib] Other (See Comments)    Increased lft's    Hydrocod  Poli-Chlorphe Poli Er Rash   Lipitor [Atorvastatin] Other (See Comments)    Leg weakness    Mevacor [Lovastatin] Other (See Comments)    Muscle weakness    Plavix [Clopidogrel Bisulfate] Itching and Other (See Comments)    Hands and feet tingle and itch   Zocor [Simvastatin] Other (See Comments)    Bones hurt    Doxycycline Diarrhea and Nausea And Vomiting   Metoprolol Other (See Comments)    Hair loss   Morphine And Related Itching and Nausea Only   Codeine Itching and Rash    Hyper-active   Lisinopril Cough     Current Outpatient Medications  Medication Sig Dispense Refill   Accu-Chek FastClix Lancets MISC Check blood sugar once daily PRN 100 each 1   albuterol (PROAIR HFA) 108 (90 Base) MCG/ACT inhaler Inhale 2 puffs into the lungs every 6 (six) hours as needed for wheezing or shortness of breath. 1 each 11   ALPRAZolam (XANAX) 0.25 MG tablet Take 1 tablet (0.25 mg total) by mouth at bedtime as needed for sleep. (Patient taking differently: Take 0.25 mg by mouth daily as needed for sleep or anxiety.) 90 tablet 3   amLODipine (NORVASC) 2.5 MG tablet Take 1 tablet (2.5 mg total) by mouth daily. 90 tablet 3   aspirin EC 81 MG tablet Take 1 tablet (81 mg total) by mouth daily. 90 tablet 3   atorvastatin (LIPITOR) 80 MG tablet TAKE 1 TABLET BY MOUTH EVERY DAY 90 tablet 3   B Complex-Biotin-FA (B-100 COMPLEX PO) Take 1 tablet by mouth every evening.     bismuth subsalicylate (PEPTO BISMOL) 262 MG/15ML suspension Take 30 mLs by mouth every 6 (six) hours as needed for indigestion or diarrhea or loose stools.     cetirizine (ZYRTEC) 10 MG tablet Take 10 mg by mouth daily as needed for allergies.     cholecalciferol (VITAMIN D3) 25 MCG (1000 UNIT) tablet Take 1,000 Units by  mouth every evening.     CORDRAN 4 MCG/SQCM TAPE Apply 1 each topically daily as needed (Rash).      cycloSPORINE (RESTASIS) 0.05 % ophthalmic emulsion Place 1 drop into both eyes 2 (two) times daily.      ezetimibe  (ZETIA) 10 MG tablet TAKE 1 TABLET BY MOUTH EVERY DAY 90 tablet 3   gabapentin (NEURONTIN) 300 MG capsule Take 1 capsule (300 mg total) by mouth 3 (three) times daily. (Patient taking differently: Take 300 mg by mouth 2 (two) times daily.) 270 capsule 3   glucose blood (ACCU-CHEK SMARTVIEW) test strip CHECK BLOOD SUGAR ONCE DAILY AS NEEDED 100 strip 1   hydrochlorothiazide (MICROZIDE) 12.5 MG capsule Take 1 capsule (12.5 mg total) by mouth daily. 90 capsule 3   isosorbide mononitrate (IMDUR) 30 MG 24 hr tablet Take 3 tablets ( 90 mg ) every morning and 1 tablet (30 mg ) every evening (Patient taking differently: Take 30-90 mg by mouth See admin instructions. Take 3 tablets ( 90 mg ) every morning and 1 or 2 tablets (30 - 60 mg ) every evening) 360 tablet 3   losartan (COZAAR) 100 MG tablet Take 1 tablet (100 mg total) by mouth daily. 90 tablet 3   metoprolol succinate (TOPROL XL) 25 MG 24 hr tablet Take 1 tablet (25 mg total) by mouth daily. 90 tablet 3   Multiple Vitamins-Minerals (MULTIVITAMIN WITH MINERALS) tablet Take 1 tablet by mouth every evening.     nitroGLYCERIN (NITROSTAT) 0.4 MG SL tablet Place 1 tablet (0.4 mg total) under the tongue every 5 (five) minutes as needed for chest pain. 25 tablet 11   omeprazole (PRILOSEC) 40 MG capsule Take 1 capsule (40 mg total) by mouth in the morning and at bedtime. 180 capsule 3   oxyCODONE-acetaminophen (PERCOCET) 10-325 MG tablet Take 1 tablet by mouth in the morning and at bedtime. Can take one tablet three times a day as needed for back pain     polyethylene glycol (MIRALAX / GLYCOLAX) 17 g packet Take 17 g by mouth daily as needed for moderate constipation.     prasugrel (EFFIENT) 5 MG TABS tablet TAKE 1 TABLET (5 MG TOTAL) BY MOUTH DAILY. 90 tablet 3   PRESCRIPTION MEDICATION Place 2 puffs into both ears daily as needed (ear infections). [facility] ear powder     Simethicone (GAS-X PO) Take 1-2 tablets by mouth daily as needed (gas).     Tiotropium  Bromide-Olodaterol (STIOLTO RESPIMAT) 2.5-2.5 MCG/ACT AERS Inhale 2 puffs into the lungs daily. 1 each 3   tretinoin (RETIN-A) 0.1 % cream Apply 1 application. topically every evening.     No current facility-administered medications for this visit.    Physical Examination  Vitals:   01/17/23 0854  BP: (!) 147/70  Pulse: 68  Resp: 20  Temp: (!) 97.5 F (36.4 C)  TempSrc: Temporal  SpO2: 92%  Weight: 135 lb 1.6 oz (61.3 kg)  Height: 5' (1.524 m)    Body mass index is 26.38 kg/m.  General:  Alert and oriented, no acute distress HEENT: Normal Neck: No bruit or JVD Pulmonary: Clear to auscultation bilaterally Cardiac: Regular Rate and Rhythm without murmur Abdomen: Soft, non-tender, non-distended, no mass, no scars Skin: No rash Extremity Pulses:   radial,  femoral, dorsalis pedis, pulses bilaterally Musculoskeletal: No deformity or edema  Neurologic: Upper and lower extremity motor 5/5 and symmetric  DATA:   Endovascular Aortic Repair (EVAR):  +----------+----------------+-------------------+-------------------+  Diameter AP (cm)Diameter Trans (cm)Velocities (cm/sec)  +----------+----------------+-------------------+-------------------+  Aorta    4.62            4.70               51                   +----------+----------------+-------------------+-------------------+  Right Limb1.34            1.46               80                   +----------+----------------+-------------------+-------------------+  Left Limb 1.31            1.23               61                   +----------+----------------+-------------------+-------------------+      Summary:  Abdominal Aorta: The largest aortic measurement is 4.7 cm. Patent  endovascular aneurysm repair with no evidence of endoleak. The largest  aortic diameter has increased compared to prior exam. Previous diameter  measurement was 4.0 cm obtained on 03/06/2014.      ASSESSMENT/PLAN:    PAD AAA  With history of aortic aneurysm repair with stent graft,2009, left common iliac stent in 2014 graft due to claudication symptoms as well as previous renal artery stenting 2008.     Previous studies showed 4.0 cm AAA sac that has shrunk in size over time and no endo-leak.  Today's duplex demonstrated largest diameter of 4.7 cm  without evidence of endo-leak.     There could be a type IV endo leak through the fabric.  We will have her return in 6 months with a CTA Abd/Pelvis.      Roxy Horseman PA-C [facility] [place] Office: [phone]  MD in clinic [place]

## 2023-01-17 NOTE — Progress Notes (Signed)
Breztri sent to [facility], stiolto removed from med list.

## 2023-01-18 ENCOUNTER — Ambulatory Visit: Payer: Medicare Other

## 2023-01-20 ENCOUNTER — Ambulatory Visit: Payer: Medicare Other | Admitting: Physical Therapy

## 2023-01-21 ENCOUNTER — Other Ambulatory Visit: Payer: Self-pay

## 2023-01-21 ENCOUNTER — Encounter (INDEPENDENT_AMBULATORY_CARE_PROVIDER_SITE_OTHER): Payer: Medicare Other

## 2023-01-21 DIAGNOSIS — R42 Dizziness and giddiness: Secondary | ICD-10-CM

## 2023-01-21 NOTE — Telephone Encounter (Signed)
Please call to [phone] and resend the referral. They state they did not receive it. Please advise.

## 2023-01-21 NOTE — Telephone Encounter (Signed)
Referral placed.

## 2023-01-24 DIAGNOSIS — M48061 Spinal stenosis, lumbar region without neurogenic claudication: Secondary | ICD-10-CM | POA: Diagnosis not present

## 2023-01-25 ENCOUNTER — Ambulatory Visit: Payer: Medicare Other

## 2023-01-25 NOTE — Progress Notes (Signed)
Cardiology Office Note   Date:  01/31/2023   ID:  Tangla, Morosky 08/27/1946, MRN [id number]  PCP:  [name], MD  Cardiologist: Jawan Chavarria Martinique MD  Chief Complaint  Patient presents with   Coronary Artery Disease    History of Present Illness: Megan [name] is a 77 y.o. female who is seen for follow up CAD.  She has a history  of CAD, HTN, HL, and PAD.   She is status post coronary artery bypass graft surgery in 1995- Dr Servando Snare. She had a renal artery stent for renal artery stenosis in 2008. She had a stent graft of an abdominal aortic aneurysm 2009. She is s/p stenting of the left iliac. She is followed by [name] for PAD. Last CT in May 2018. She had [place] in June 2019 showing no endoleak.    She was hospitalized in July 2014 and had cardiac catheterization which showed that her grafts were open. EF normal.  She was hospitalized 11/15/2014 with which he describes as an aching sensation in her chest.    She underwent a treadmill Myoview on 11/17/14 which showed no ischemia and her ejection fraction was 86%.   In June 2019 she had Abdominal [facility] at [facility] showing AAA of 4 cm.   In October 2019 she underwent right rotator cuff repair. No complications.   She was  having progressive issues with her low back that limited her activity and caused pain. She has exhausted conservative measures and underwent lumbar fusion in July by Dr Ronnald Ramp.   She was in the hospital  1/31 and discharged on 2/8- for AKI, diarrhea- diverticulitis (sigmoid colon), metabolic encephalopathy, UTI. Echo at that time was OK with normal EF and moderate TR.   She was re- admitted 2/26-02/03/21 with NSTEMI. Troponin up to 1085. Ecg showed significant inferolateral TWA. She underwent cardiac cath demonstrating patent LIMA to the LAD. The native RCA was occluded and she had severe sequential lesions in the SVG to RCA. This was treated with overlapping DES x 2- 3.0 x 26 and 3.5 x 34 mm Onyx stents.  Procedure was  complicated by a left forearm hematoma. She was treated with DAPT with ASA and Brilinta. On metoprolol and high dose statin.   She was admitted for overnight observation on 02/11/21 after presenting with a localized ache in her left chest. This was different than when she presented with her NSTEMI. Ecg showed no new changes and troponin was negative x 3  On her last visit she complained of increased SOB- this was felt to be related to [place]. We switched to Plavix but she had an allergic reaction with rash and hives. Later switched to Prasugrel- reduced dose based on age and female.    She presented to the ED on 7/21 with complaints of chest pain over the past 2 days. Developed an aching sensation in the left side of her chest on Tuesday afternoon. Tried a SL nitro without relief. Also tried several reflux medications without much either. Symptoms lingered into the night and next morning she woke up with ongoing symptoms.  She underwent repeat cardiac cath demonstrating restenosis of the SVG to RCA with clear evidence of prior stent fracture. She underwent repeat PCI with 3.5 x 26 mm Onyx stent post dilated to 4.0 with [place] balloon using IVUS guidance. Of note there were no collaterals to the distal RCA. There was significant aortic calcification. On July 28 she complained of some recurrent chest pain  and her Imdur was increased. When seen in office on August 17 she was better. A sleep study ordered due to daytime somnolence.   She was seen in the ED on 08/27/21 with recurrent chest pain. Ecg showed no changes and troponins were negative. Reports pain the same as before. In discussion with cardiology fellow she was DC from ED with increased dose of Imdur to 90 mg daily and metoprolol to 37.5 mg bid. She was concerned so she underwent repeat cardiac cath which showed the stent was patent.   Was seen recently by Laurann Montana PA on Jully 25.  Noted symptoms of dyspnea. was seen by her PCP. Noted her BP was a  little high and Losartan dose was increased to '100mg'$  daily. The next day started having cold chills and shaking. Felt she was coming down with a cold. Noted body aches, temperature was 32 F which is much higher than her baseline. Cough with sputum production. COVID tests x3 were negative. Later developed  sporadic shortness of breath associated with chest aching. This was similar to prior anginal symptoms but not as bad. Labs were done and were normal. Ecg no change. Imdur was increased to 90 mg daily. Sometimes will take an extra dose in the evening.  She continued to have symptoms of atypical chest pain and some dyspnea so we proceeded with repeat cardiac cath. This showed patent grafts and no new disease.  On follow up today she notes problems with her balance. Is seeing Neurology today. Has bursitis in her hips and is going to get a steroid injection. Has easy bruising. A couple of spells of angina but overall doing well with that. Seen by [facility] and aneurysm sac increased to 4.7 cm. Plan repeat CT in 6 months. She does feel fatigued a lot.     Past Medical History:  Diagnosis Date   AAA (abdominal aortic aneurysm) ([place])    a. s/p stent grafting in 2009.   Adenomatous colon polyp 05/2001   Adenomatous duodenal polyp    Allergy    Anginal pain (HCC)    COPD (chronic obstructive pulmonary disease) ([place])    pt denies COPD   Coronary artery disease    a. s/p CABG in 1995;  b. 05/2013 Neg MV, EF 82%;  c. 06/2013 Cath: LM nl, LAD 40-50p, LCX nl, RCA 80p/118m VG->RCA->PDA->PLSA min irregs, LIMA->LAD atretic, vigorous LV fxn.   Diabetes mellitus without complication (HCC)    history of, resolved. 2017   Diverticulosis    Eczema, dyshidrotic    Eczema, dyshidrotic 1986   Dr. HMathis Fare  Emphysema of lung ([facility]    Fatty liver    Fibromyalgia    GERD (gastroesophageal reflux disease)    Hyperlipidemia    Hypertension    Migraine    Myocardial infarction ([facility]    Osteoarthritis     Osteoporosis    unsure, having a bone scan soon   Peripheral arterial disease ([place]    left leg diagnosed by Dr BMare Ferrari  Renal artery stenosis ([facility]    a. 2008 s/p PTA    Past Surgical History:  Procedure Laterality Date   ABDOMINAL AORTAGRAM N/A 03/06/2014   Procedure: ABDOMINAL AORTAGRAM;  Surgeon: TRosetta Posner MD;  [facility];  Service: Cardiovascular;  Laterality: N/A;   ABDOMINAL AORTIC ANEURYSM REPAIR  2009   stenting   ABDOMINAL HYSTERECTOMY     Total, 1992   BREAST BIOPSY     CARDIAC CATHETERIZATION  CARDIOVASCULAR STRESS TEST  11/11/2009   normal study   CARPAL TUNNEL RELEASE  08/1993   left wrist   CARPAL TUNNEL RELEASE  01/1997   right   CATARACT EXTRACTION, BILATERAL  2021   CORONARY ARTERY BYPASS GRAFT  07/1994   Triple by Dr Ceasar Mons   CORONARY STENT INTERVENTION N/A 02/02/2021   Procedure: CORONARY STENT INTERVENTION;  Surgeon: Nelva Bush, MD;  Location: [place] CV LAB;  Service: Cardiovascular;  Laterality: N/A;   CORONARY STENT INTERVENTION N/A 06/26/2021   Procedure: CORONARY STENT INTERVENTION;  Surgeon: Sherren Mocha, MD;  Location: [place] CV LAB;  Service: Cardiovascular;  Laterality: N/A;   dental implants     EYE SURGERY  03/2020   cataract   HAND SURGERY Right 09/2002   Right hand pulley release   INTRAVASCULAR ULTRASOUND/IVUS N/A 06/26/2021   Procedure: Intravascular Ultrasound/IVUS;  Surgeon: Sherren Mocha, MD;  Location: [place] CV LAB;  Service: Cardiovascular;  Laterality: N/A;   INTRAVASCULAR ULTRASOUND/IVUS N/A 09/03/2021   Procedure: Intravascular Ultrasound/IVUS;  Surgeon: Early Osmond, MD;  Location: [place] CV LAB;  Service: Cardiovascular;  Laterality: N/A;   LAMINECTOMY WITH POSTERIOR LATERAL ARTHRODESIS LEVEL 1 N/A 03/24/2022   Procedure: LAMINECTOMY, FORAMINOTOMY, LUMBAR TWO- THREE ; INTERTRANSVERSE FUSION LUMBAR TWO - LUMBAR THREE RIGHT;  Surgeon: Eustace Moore, MD;  Location: [place];  Service:  Neurosurgery;  Laterality: N/A;   LEFT HEART CATH AND CORONARY ANGIOGRAPHY N/A 06/26/2021   Procedure: LEFT HEART CATH AND CORONARY ANGIOGRAPHY;  Surgeon: Sherren Mocha, MD;  Location: [place] CV LAB;  Service: Cardiovascular;  Laterality: N/A;   LEFT HEART CATH AND CORS/GRAFTS ANGIOGRAPHY N/A 02/02/2021   Procedure: LEFT HEART CATH AND CORS/GRAFTS ANGIOGRAPHY;  Surgeon: Nelva Bush, MD;  Location: [place] CV LAB;  Service: Cardiovascular;  Laterality: N/A;   LEFT HEART CATH AND CORS/GRAFTS ANGIOGRAPHY N/A 09/03/2021   Procedure: LEFT HEART CATH AND CORS/GRAFTS ANGIOGRAPHY;  Surgeon: Early Osmond, MD;  Location: [place] CV LAB;  Service: Cardiovascular;  Laterality: N/A;   LEFT HEART CATH AND CORS/GRAFTS ANGIOGRAPHY N/A 07/15/2022   Procedure: LEFT HEART CATH AND CORS/GRAFTS ANGIOGRAPHY;  Surgeon: Martinique, Kebra Lowrimore M, MD;  Location: [place] CV LAB;  Service: Cardiovascular;  Laterality: N/A;   LEFT HEART CATHETERIZATION WITH CORONARY/GRAFT ANGIOGRAM N/A 06/11/2013   Procedure: LEFT HEART CATHETERIZATION WITH [name];  Surgeon: Thayer Headings, MD;  Location: [facility] CATH LAB;  Service: Cardiovascular;  Laterality: N/A;   MULTIPLE TOOTH EXTRACTIONS  2000   All upper teeth.  Has full dneture.    RENAL ARTERY STENT  2008   SHOULDER ARTHROSCOPY WITH ROTATOR CUFF REPAIR Right 09/27/2018   Procedure: Right shoulder mini open rotator cuff repair;  Surgeon: Susa Day, MD;  Location: [facility];  Service: Orthopedics;  Laterality: Right;  90 mins   thyroid cyst apiration  05/1997 and 07/1998   TONSILECTOMY, ADENOIDECTOMY, BILATERAL MYRINGOTOMY AND TUBES  1989   TONSILLECTOMY     TOTAL ABDOMINAL HYSTERECTOMY  1992     Current Outpatient Medications  Medication Sig Dispense Refill   Accu-Chek FastClix Lancets MISC Check blood sugar once daily PRN 100 each 1   albuterol (PROAIR HFA) 108 (90 Base) MCG/ACT inhaler Inhale 2 puffs into the lungs every 6 (six) hours as needed for  wheezing or shortness of breath. 1 each 11   ALPRAZolam (XANAX) 0.25 MG tablet Take 1 tablet (0.25 mg total) by mouth at bedtime as needed for sleep. (Patient taking differently: Take  0.25 mg by mouth daily as needed for sleep or anxiety.) 90 tablet 3   amLODipine (NORVASC) 2.5 MG tablet Take 1 tablet (2.5 mg total) by mouth daily. 90 tablet 3   aspirin EC 81 MG tablet Take 1 tablet (81 mg total) by mouth daily. 90 tablet 3   atorvastatin (LIPITOR) 80 MG tablet TAKE 1 TABLET BY MOUTH EVERY DAY 90 tablet 3   B Complex-Biotin-FA (B-100 COMPLEX PO) Take 1 tablet by mouth every evening.     bismuth subsalicylate (PEPTO BISMOL) 262 MG/15ML suspension Take 30 mLs by mouth every 6 (six) hours as needed for indigestion or diarrhea or loose stools.     Budeson-Glycopyrrol-Formoterol (BREZTRI AEROSPHERE) 160-9-4.8 MCG/ACT AERO Inhale 2 puffs into the lungs in the morning and at bedtime. 1 each 11   cetirizine (ZYRTEC) 10 MG tablet Take 10 mg by mouth daily as needed for allergies.     cholecalciferol (VITAMIN D3) 25 MCG (1000 UNIT) tablet Take 1,000 Units by mouth every evening.     CORDRAN 4 MCG/SQCM TAPE Apply 1 each topically daily as needed (Rash).      cycloSPORINE (RESTASIS) 0.05 % ophthalmic emulsion Place 1 drop into both eyes 2 (two) times daily.      Ergocalciferol 50 MCG (2000 UT) CAPS Take by mouth.     ezetimibe (ZETIA) 10 MG tablet TAKE 1 TABLET BY MOUTH EVERY DAY 90 tablet 3   gabapentin (NEURONTIN) 300 MG capsule Take 1 capsule (300 mg total) by mouth 3 (three) times daily. (Patient taking differently: Take 300 mg by mouth 2 (two) times daily.) 270 capsule 3   glucose blood (ACCU-CHEK SMARTVIEW) test strip CHECK BLOOD SUGAR ONCE DAILY AS NEEDED 100 strip 1   hydrochlorothiazide (MICROZIDE) 12.5 MG capsule Take 1 capsule (12.5 mg total) by mouth daily. 90 capsule 3   isosorbide mononitrate (IMDUR) 30 MG 24 hr tablet Take 3 tablets ( 90 mg ) every morning and 1 tablet (30 mg ) every evening  (Patient taking differently: Take 30-90 mg by mouth See admin instructions. Take 3 tablets ( 90 mg ) every morning and 1 or 2 tablets (30 - 60 mg ) every evening) 360 tablet 3   losartan (COZAAR) 100 MG tablet Take 1 tablet (100 mg total) by mouth daily. 90 tablet 3   metoprolol succinate (TOPROL XL) 25 MG 24 hr tablet Take 1 tablet (25 mg total) by mouth daily. 90 tablet 3   Multiple Vitamins-Minerals (MULTIVITAMIN WITH MINERALS) tablet Take 1 tablet by mouth every evening.     nitroGLYCERIN (NITROSTAT) 0.4 MG SL tablet Place 1 tablet (0.4 mg total) under the tongue every 5 (five) minutes as needed for chest pain. 25 tablet 11   omeprazole (PRILOSEC) 40 MG capsule Take 1 capsule (40 mg total) by mouth in the morning and at bedtime. 180 capsule 3   oxyCODONE-acetaminophen (PERCOCET) 10-325 MG tablet Take 1 tablet by mouth in the morning and at bedtime. Can take one tablet three times a day as needed for back pain     polyethylene glycol (MIRALAX / GLYCOLAX) 17 g packet Take 17 g by mouth daily as needed for moderate constipation.     prasugrel (EFFIENT) 5 MG TABS tablet TAKE 1 TABLET (5 MG TOTAL) BY MOUTH DAILY. 90 tablet 3   PRESCRIPTION MEDICATION Place 2 puffs into both ears daily as needed (ear infections). [facility] ear powder     Simethicone (GAS-X PO) Take 1-2 tablets by mouth daily as needed (gas).  torsemide (DEMADEX) 20 MG tablet Take by mouth.     tretinoin (RETIN-A) 0.1 % cream Apply 1 application. topically every evening.     No current facility-administered medications for this visit.    Allergies:   Brilinta [ticagrelor], Bextra [valdecoxib], Hydrocod poli-chlorphe poli er, Lipitor [atorvastatin], Mevacor [lovastatin], Plavix [clopidogrel bisulfate], Zocor [simvastatin], Doxycycline, Metoprolol, Morphine and related, Codeine, and Lisinopril    Social History:  The patient  reports that she quit smoking about 27 years ago. Her smoking use included cigarettes. She has never been  exposed to tobacco smoke. She has never used smokeless tobacco. She reports current alcohol use of about 6.0 - 10.0 standard drinks of alcohol per week. She reports that she does not use drugs.   Family History:  The patient's family history includes AAA (abdominal aortic aneurysm) in her brother; Breast cancer in her sister; Colon cancer in her cousin and paternal grandmother; Diabetes in her brother and sister; Heart attack in her brother, father, mother, and sister; Heart disease in her brother, brother, father, mother, and sister; Hyperlipidemia in her brother and sister; Hypertension in her brother and sister; Liver cancer in her sister.    ROS:  Please see the history of present illness.   Otherwise, review of systems are positive for none.   All other systems are reviewed and negative.    PHYSICAL EXAM: VS:  BP (!) 158/62 (BP Location: Right Arm, Patient Position: Sitting, Cuff Size: Normal)   Pulse 80   Ht '4\' 11"'$  (1.499 m)   Wt 136 lb 3.2 oz (61.8 kg)   SpO2 94%   BMI 27.51 kg/m  , BMI Body mass index is 27.51 kg/m. GENERAL:  Overweight  WF in NAD HEENT:  PERRL, EOMI, sclera are clear. Oropharynx is clear. NECK:  No jugular venous distention, carotid upstroke brisk and symmetric, no bruits, no thyromegaly or adenopathy LUNGS:  Clear to auscultation bilaterally CHEST:  Unremarkable HEART:  RRR,  PMI not displaced or sustained,S1 and S2 within normal limits, no S3, no S4: no clicks, no rubs, no murmurs ABD:  Soft, nontender. BS +, no masses or bruits. No hepatomegaly, no splenomegaly EXT:  1 + pulses throughout, no edema, no cyanosis no clubbing SKIN:  Warm and dry.  No rashes NEURO:  Alert and oriented x 3. Cranial nerves II through XII intact. PSYCH:  Cognitively intact     EKG:  EKG is not done today.    Recent Labs: 06/29/2022: BNP 94.2; Magnesium 1.9 12/22/2022: ALT 22; BUN 9; Creatinine, Ser 0.69; Hemoglobin 14.3; Platelets 233.0; Potassium 3.8; Sodium 142 12/23/2022:  TSH 1.86   Labs from primary care 12/15/16: A1c 5.6%. : Cholesterol 167 Trig-209, HDL 52, LDL 73.  AST 51, ALT 65. Normal renal indices. TSH is normal. Dated 06/24/17: CRP <0.3. Glucose 120, AST 58, ALT 80, other chemistries normal.  Dated 09/28/18: normal CBC, CMET  Lipid Panel    Component Value Date/Time   CHOL 148 12/22/2022 1110   CHOL 142 04/05/2022 1005   TRIG 130.0 12/22/2022 1110   HDL 63.60 12/22/2022 1110   HDL 51 04/05/2022 1005   CHOLHDL 2 12/22/2022 1110   VLDL 26.0 12/22/2022 1110   LDLCALC 58 12/22/2022 1110   LDLCALC 57 04/05/2022 1005   LDLDIRECT 82.0 06/13/2020 1017      Wt Readings from Last 3 Encounters:  01/31/23 136 lb 3.2 oz (61.8 kg)  01/17/23 135 lb 1.6 oz (61.3 kg)  12/22/22 136 lb 9.6 oz (62 kg)  Cardiac cath / PCI 02/02/21: CORONARY STENT INTERVENTION  LEFT HEART CATH AND CORS/GRAFTS ANGIOGRAPHY    Conclusion  Conclusions: Severe single-vessel coronary artery disease with chronic total occlusion of mid RCA. Mild to moderate, diffuse LAD disease with competitive flow in the distal vessel from LIMA-LAD. Small but patent LIMA-LAD. Patent SVG-rPDA-rPL with diffuse ostial/proximal disease of up to 60% and tandem hazy 95% and 90% mid graft lesions. Successful PCI to ostial/proximal and mid SVG-rPDA-rPL using non-overlapping Resolute Onyx 3.0 x 26 mm and 3.5 x 34 mm drug-eluting stents with 0% residual stenosis and TIMI-3 flow. Left forearm hematoma following sheath removal ant TR band placement, controlled with manual compression and placement of second TR band proximal to the first band.   Recommendations: Dual antiplatelet therapy with aspirin and ticagrelor for at least 12 months. Aggressive secondary prevention. Close monitoring of left forearm hematoma.   Nelva Bush, MD [facility]    Coronary Diagrams   Diagnostic Dominance: Right    Intervention     Implants       Echo 01/31/21: IMPRESSIONS     1. Left  ventricular ejection fraction, by estimation, is 60 to 65%. The  left ventricle has normal function. The left ventricle has no regional  wall motion abnormalities. Left ventricular diastolic parameters are  consistent with Grade I diastolic  dysfunction (impaired relaxation).   2. Right ventricular systolic function is normal. The right ventricular  size is normal. There is normal pulmonary artery systolic pressure.   3. Left atrial size was severely dilated.   4. The mitral valve is grossly normal. Mild mitral valve regurgitation:  Apperance of two jets; study may underestimated regurgitation severity.   5. The aortic valve is tricuspid. Aortic valve regurgitation is not  visualized. No aortic stenosis is present.   6. The inferior vena cava is normal in size with greater than 50%  respiratory variability, suggesting right atrial pressure of 3 mmHg.   Comparison(s): A prior study was performed on 01/09/21. No significant  change from prior study. Prior images reviewed side by side. Similar to  prior.    Cardiac cath 06/26/21: Procedures  CORONARY STENT INTERVENTION  Intravascular Ultrasound/IVUS  LEFT HEART CATH AND CORONARY ANGIOGRAPHY   Conclusion      Dist LM lesion is 15% stenosed.   Prox RCA lesion is 75% stenosed.   Mid RCA to Dist RCA lesion is 100% stenosed with 100% stenosed side branch in RPAV.   Origin to Prox Graft lesion before RPDA  is 80% stenosed.   Non-stenotic Mid Graft-1 lesion before RPDA was previously treated.   Non-stenotic Mid Graft-2 lesion before RPDA was previously treated.   A drug-eluting stent was successfully placed using a STENT ONYX FRONTIER 3.5X26.   Post intervention, there is a 20% residual stenosis.   Seq SVG- and is large.   LIMA and is small.   There is competitive flow.   1.  Stable native vessel coronary artery disease with chronic total occlusion of the proximal to mid RCA, patency of the left main, diffuse mild to moderate calcific  nonobstructive LAD stenosis, and moderate nonobstructive proximal left circumflex stenosis 2.  Continued patency of the LIMA to LAD 3.  Continued patency of the sequential saphenous vein graft to PDA and posterolateral branches with severe in-stent restenosis in the proximal stent, both lesions likely related to stent fracture 4.  Successful intravascular ultrasound-guided PCI of the saphenous vein graft to RCA using a 3.5 x 26 mm resolute Onyx DES  postdilated to high pressure with a 4.0 mm noncompliant balloon.  Diagnostic Dominance: Right Intervention    Implants    Cardiac cath 09/03/21:  Early Osmond, MD (Primary)    Martinique, Jaiana Sheffer M, MD (Assisting)     Procedures  Intravascular Ultrasound/IVUS  LEFT HEART CATH AND CORS/GRAFTS ANGIOGRAPHY   Conclusion      Dist LM lesion is 15% stenosed.   Prox RCA lesion is 75% stenosed.   Mid RCA to Dist RCA lesion is 100% stenosed with 100% stenosed side branch in RPAV.   Origin to Prox Graft lesion before RPDA  is 20% stenosed.   Non-stenotic Mid Graft-1 lesion before RPDA was previously treated.   Non-stenotic Mid Graft-2 lesion before RPDA was previously treated.   Seq SVG- and is large.   LIMA and is small.   There is competitive flow.   LV end diastolic pressure is normal.    Patent vein graft to PDA with minimal constraint; IVUS interrogation demonstrated a minimal stent area of 5.57m2. Patent LIMA to LAD with relatively unchanged burden of disease elsewhere. Normal LVEDP   Recommendations:  Medical management.   Cardiac cath 07/15/22:  LEFT HEART CATH AND CORS/GRAFTS ANGIOGRAPHY   Conclusion      Dist LM lesion is 15% stenosed.   Prox RCA lesion is 75% stenosed.   Mid RCA to Dist RCA lesion is 100% stenosed with 100% stenosed side branch in RPAV.   Origin to Prox Graft lesion before RPDA  is 30% stenosed.   Prox Graft to Mid Graft lesion before RPDA  is 20% stenosed.   Non-stenotic Mid Graft-1 lesion before RPDA was  previously treated.   Non-stenotic Mid Graft-2 lesion before RPDA was previously treated.   Seq SVG- and is large.   LIMA and is small.   There is competitive flow.   The left ventricular systolic function is normal.   LV end diastolic pressure is normal.   The left ventricular ejection fraction is greater than 65% by visual estimate.   2 vessel obstructive CAD Patent LIMA to the LAD Patent SVG to RCA. Mild nonobstructive disease Normal LV function Normal LVEDP   Plan: continue medical therapy.  Coronary Diagrams  Diagnostic Dominance: Right  ASSESSMENT AND PLAN:  1. CAD status post CABG in 1995.  s/p NSTEMI in Feb. Cardiac cath demonstrated patent LIMA to the LAD. Occluded native RCA. SVG to PDA/PL had severe sequential stenoses treated with DES x 2. Normal EF.  Developed dyspnea on Brilinta. Allergic reaction to Plavix. Now on Prasugrel lower dose due to age and female sex. Had recurrent angina in July 2022. Restenosis in the proximal stent in the SVG to RCA with some associated stent fracture. Had repeat stenting with IVUS guidance. Last cardiac cath showed stable disease with only 30% in stent stenosis in SVG to RCA. Normal LV function and normal LVEDP. She has stable angina.   2. HTN with hypertensive heart disease. BP is elevated. Recommend increasing amlodipine to 5 mg daily.   3. Hypercholesterolemia- LDL 58 on statin and Zetia.   4.  Status post stent to right renal artery 2008. Patent by CT in October  5. Status post stent graft to abdominal aortic aneurysm 2009.  S/p iliac stent in 2016.follow up with [facility].   6. Peripheral artery disease followed by Dr.Cain  7. Lumbar spine disease. S/p fusion with persistent pain.  8. Persistent SOB- no clear cardiac etiology. Will see what pulmonary has    Dispostion: follow  up in 6 months.   Signed, Griselda Tosh Martinique MD, [facility]    01/31/2023 10:21 AM    [place]

## 2023-01-27 ENCOUNTER — Ambulatory Visit: Payer: Medicare Other

## 2023-01-31 ENCOUNTER — Encounter: Payer: Self-pay | Admitting: Diagnostic Neuroimaging

## 2023-01-31 ENCOUNTER — Telehealth: Payer: Self-pay | Admitting: Diagnostic Neuroimaging

## 2023-01-31 ENCOUNTER — Encounter: Payer: Self-pay | Admitting: Cardiology

## 2023-01-31 ENCOUNTER — Ambulatory Visit: Payer: Medicare Other | Attending: Cardiology | Admitting: Cardiology

## 2023-01-31 ENCOUNTER — Ambulatory Visit: Payer: Medicare Other | Admitting: Diagnostic Neuroimaging

## 2023-01-31 VITALS — BP 128/61 | HR 74 | Ht 59.0 in | Wt 136.4 lb

## 2023-01-31 VITALS — BP 158/62 | HR 80 | Ht 59.0 in | Wt 136.2 lb

## 2023-01-31 DIAGNOSIS — H814 Vertigo of central origin: Secondary | ICD-10-CM | POA: Diagnosis not present

## 2023-01-31 DIAGNOSIS — E119 Type 2 diabetes mellitus without complications: Secondary | ICD-10-CM | POA: Diagnosis not present

## 2023-01-31 DIAGNOSIS — I10 Essential (primary) hypertension: Secondary | ICD-10-CM | POA: Diagnosis not present

## 2023-01-31 DIAGNOSIS — I25708 Atherosclerosis of coronary artery bypass graft(s), unspecified, with other forms of angina pectoris: Secondary | ICD-10-CM | POA: Diagnosis not present

## 2023-01-31 DIAGNOSIS — H5500 Unspecified nystagmus: Secondary | ICD-10-CM

## 2023-01-31 DIAGNOSIS — Z95828 Presence of other vascular implants and grafts: Secondary | ICD-10-CM | POA: Diagnosis not present

## 2023-01-31 DIAGNOSIS — E785 Hyperlipidemia, unspecified: Secondary | ICD-10-CM | POA: Diagnosis not present

## 2023-01-31 MED ORDER — AMLODIPINE BESYLATE 5 MG PO TABS: 5.0000 mg | ORAL_TABLET | Freq: Every day | ORAL | 3 refills | Status: DC

## 2023-01-31 NOTE — Telephone Encounter (Signed)
[employer] medicare NPR sent to GI [phone]

## 2023-01-31 NOTE — Progress Notes (Signed)
[facility]  PATIENT: Megan [name] DOB: 12-10-1945  REFERRING CLINICIAN: [name], MD HISTORY FROM: patient  REASON FOR VISIT: new consult   HISTORICAL  CHIEF COMPLAINT:  Chief Complaint  Patient presents with   Follow-up    Patient in room #6 and alone. Patient here today to f/u with her balance issues.    HISTORY OF PRESENT ILLNESS:   UPDATE (01/31/23, VRP): Since last visit, had back surgery April 2023 which helped some. Then Nov 2023 had upper resp infx and got abx and prednisone, and then balance got worse. Went to PT, and dx'd with possible BPPV. Also noted to have some nystagmus (downbeating) and sxs not improving, then neurology eval and MRI were requested.   UPDATE 09/09/21: 77 year old female here for evaluation of gait and balance difficulty.  Patient had progressive back pain for past few years.  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  She underwent multilevel decompression surgery in July 2021.  Following surgery her back pain improved but her gait worsened.  Follow-up MRI on 09/04/2020 showed worsening spinal stenosis at L2-3 level.  Patient has tried physical therapy and personal trainer without relief.  She has some intermittent gait difficulty where she feels like she loses balance.  She has not fallen.  She still able to perform her ADLs and walk up to 1 mile per day.  Patient referred here to rule out other causes of gait difficulty.  UPDATE (05/09/18, VRP): Since last visit, doing well and migraine resolved on their own. No alleviating or aggravating factors.    Now with new onset of bilateral upper lip burning sensation and increased lower lip tingling since new dentures (upper and lower) since Jan 2019. Upper lip burning worse with patient has dentures in place, and slightly reduced when dentures taken out over night. Symptoms initially improving with denture adjustments, but now symptoms are slightly worse in last 2-3  weeks.   PRIOR HPI (08/10/17): 77 year old female here for evaluation of headaches.   Patient had onset of headaches around age 57 years old, sometimes preceded by seeing wavy lines of water. Headaches would be unilateral or bilateral, sometimes associated with unilateral hemibody numbness, sometimes a fascia, with headaches lasting up to one dated time. Patient had one or 2 headaches per year. After she had a hysterectomy in 1992 her headaches significant improved. Patient was never officially diagnosed with migraine headaches but suspected that she had migraines due to her sister's history of migraines and her paternal grandmother's history of migraines. Patient never tried prescription migraine medication.   06/15/2017 patient had cold sensation and chills. She then had headache which continued until the next day. This reminded her of her previous headaches earlier in life. Headache recurred another day later and lasted for 5 weeks continuously. She had 2 episodes of visual aura. She then had headaches from August 20 until the 30th. She had another headache from September 2 of September 5. No nausea vomiting. No photophobia or phonophobia. She has had off-balance sensation. She has had left-sided headaches ranging from 3 out of 10 in severity. She has tried Midrin and Fioricet without relief. She is tried ibuprofen and Tylenol without relief.   Initially patient cannot identify any triggering factors. However 1 week prior to onset of headaches she had a tick bite. Also starting in May 2018 through July 2018, patient had significant dental and oral surgery procedures including 5 oral surgery implants as well as a bone spur removal.  She has been left with residual post surgical numbness in her right lower lip and chin.   Also in the end of July 2018 patient had sudden onset of hearing loss in her right ear. No other associated symptoms. Patient went to ENT for evaluation. Patient was treated empirically  with prednisone, acyclovir and doxycycline. Her hearing loss has significantly improved.     REVIEW OF SYSTEMS: Full 14 system review of systems performed and negative with exception of: As per HPI.  ALLERGIES: Allergies  Allergen Reactions   Brilinta [Ticagrelor] Shortness Of Breath   Bextra [Valdecoxib] Other (See Comments)    Increased lft's    Hydrocod Poli-Chlorphe Poli Er Rash   Lipitor [Atorvastatin] Other (See Comments)    Leg weakness    Mevacor [Lovastatin] Other (See Comments)    Muscle weakness    Plavix [Clopidogrel Bisulfate] Itching and Other (See Comments)    Hands and feet tingle and itch   Zocor [Simvastatin] Other (See Comments)    Bones hurt    Doxycycline Diarrhea and Nausea And Vomiting   Metoprolol Other (See Comments)    Hair loss   Morphine And Related Itching and Nausea Only   Codeine Itching and Rash    Hyper-active   Lisinopril Cough    HOME MEDICATIONS: Outpatient Medications Prior to Visit  Medication Sig Dispense Refill   Accu-Chek FastClix Lancets MISC Check blood sugar once daily PRN 100 each 1   albuterol (PROAIR HFA) 108 (90 Base) MCG/ACT inhaler Inhale 2 puffs into the lungs every 6 (six) hours as needed for wheezing or shortness of breath. 1 each 11   ALPRAZolam (XANAX) 0.25 MG tablet Take 1 tablet (0.25 mg total) by mouth at bedtime as needed for sleep. (Patient taking differently: Take 0.25 mg by mouth daily as needed for sleep or anxiety.) 90 tablet 3   amLODipine (NORVASC) 5 MG tablet Take 1 tablet (5 mg total) by mouth daily. 90 tablet 3   aspirin EC 81 MG tablet Take 1 tablet (81 mg total) by mouth daily. 90 tablet 3   atorvastatin (LIPITOR) 80 MG tablet TAKE 1 TABLET BY MOUTH EVERY DAY 90 tablet 3   B Complex-Biotin-FA (B-100 COMPLEX PO) Take 1 tablet by mouth every evening.     bismuth subsalicylate (PEPTO BISMOL) 262 MG/15ML suspension Take 30 mLs by mouth every 6 (six) hours as needed for indigestion or diarrhea or loose  stools.     Budeson-Glycopyrrol-Formoterol (BREZTRI AEROSPHERE) 160-9-4.8 MCG/ACT AERO Inhale 2 puffs into the lungs in the morning and at bedtime. 1 each 11   cetirizine (ZYRTEC) 10 MG tablet Take 10 mg by mouth daily as needed for allergies.     cholecalciferol (VITAMIN D3) 25 MCG (1000 UNIT) tablet Take 1,000 Units by mouth every evening.     CORDRAN 4 MCG/SQCM TAPE Apply 1 each topically daily as needed (Rash).      cycloSPORINE (RESTASIS) 0.05 % ophthalmic emulsion Place 1 drop into both eyes 2 (two) times daily.      Ergocalciferol 50 MCG (2000 UT) CAPS Take by mouth.     ezetimibe (ZETIA) 10 MG tablet TAKE 1 TABLET BY MOUTH EVERY DAY 90 tablet 3   gabapentin (NEURONTIN) 300 MG capsule Take 1 capsule (300 mg total) by mouth 3 (three) times daily. (Patient taking differently: Take 300 mg by mouth 2 (two) times daily.) 270 capsule 3   glucose blood (ACCU-CHEK SMARTVIEW) test strip CHECK BLOOD SUGAR ONCE DAILY AS NEEDED 100 strip 1  hydrochlorothiazide (MICROZIDE) 12.5 MG capsule Take 1 capsule (12.5 mg total) by mouth daily. 90 capsule 3   isosorbide mononitrate (IMDUR) 30 MG 24 hr tablet Take 3 tablets ( 90 mg ) every morning and 1 tablet (30 mg ) every evening (Patient taking differently: Take 30-90 mg by mouth See admin instructions. Take 3 tablets ( 90 mg ) every morning and 1 or 2 tablets (30 - 60 mg ) every evening) 360 tablet 3   losartan (COZAAR) 100 MG tablet Take 1 tablet (100 mg total) by mouth daily. 90 tablet 3   metoprolol succinate (TOPROL XL) 25 MG 24 hr tablet Take 1 tablet (25 mg total) by mouth daily. 90 tablet 3   Multiple Vitamins-Minerals (MULTIVITAMIN WITH MINERALS) tablet Take 1 tablet by mouth every evening.     nitroGLYCERIN (NITROSTAT) 0.4 MG SL tablet Place 1 tablet (0.4 mg total) under the tongue every 5 (five) minutes as needed for chest pain. 25 tablet 11   omeprazole (PRILOSEC) 40 MG capsule Take 1 capsule (40 mg total) by mouth in the morning and at bedtime. 180  capsule 3   oxyCODONE-acetaminophen (PERCOCET) 10-325 MG tablet Take 1 tablet by mouth in the morning and at bedtime. Can take one tablet three times a day as needed for back pain     polyethylene glycol (MIRALAX / GLYCOLAX) 17 g packet Take 17 g by mouth daily as needed for moderate constipation.     prasugrel (EFFIENT) 5 MG TABS tablet TAKE 1 TABLET (5 MG TOTAL) BY MOUTH DAILY. 90 tablet 3   PRESCRIPTION MEDICATION Place 2 puffs into both ears daily as needed (ear infections). [facility] ear powder     Simethicone (GAS-X PO) Take 1-2 tablets by mouth daily as needed (gas).     torsemide (DEMADEX) 20 MG tablet Take by mouth.     tretinoin (RETIN-A) 0.1 % cream Apply 1 application. topically every evening.     No facility-administered medications prior to visit.    PAST MEDICAL HISTORY: Past Medical History:  Diagnosis Date   AAA (abdominal aortic aneurysm) ([place])    a. s/p stent grafting in 2009.   Adenomatous colon polyp 05/2001   Adenomatous duodenal polyp    Allergy    Anginal pain (HCC)    COPD (chronic obstructive pulmonary disease) ([place])    pt denies COPD   Coronary artery disease    a. s/p CABG in 1995;  b. 05/2013 Neg MV, EF 82%;  c. 06/2013 Cath: LM nl, LAD 40-50p, LCX nl, RCA 80p/111m VG->RCA->PDA->PLSA min irregs, LIMA->LAD atretic, vigorous LV fxn.   Diabetes mellitus without complication (HCC)    history of, resolved. 2017   Diverticulosis    Eczema, dyshidrotic    Eczema, dyshidrotic 1986   Dr. HMathis Fare  Emphysema of lung ([facility]    Fatty liver    Fibromyalgia    GERD (gastroesophageal reflux disease)    Hyperlipidemia    Hypertension    Migraine    Myocardial infarction ([facility]    Osteoarthritis    Osteoporosis    unsure, having a bone scan soon   [place]    left leg diagnosed by Dr BMare Ferrari  Renal artery stenosis ([facility]    a. 2008 s/p PTA    PAST SURGICAL HISTORY: Past Surgical History:  Procedure Laterality Date   ABDOMINAL  AORTAGRAM N/A 03/06/2014   Procedure: ABDOMINAL AORTAGRAM;  Surgeon: TRosetta Posner MD;  [facility];  Service: Cardiovascular;  Laterality: N/A;   ABDOMINAL AORTIC ANEURYSM REPAIR  2009   stenting   ABDOMINAL HYSTERECTOMY     Total, 1992   BREAST BIOPSY     CARDIAC CATHETERIZATION     CARDIOVASCULAR STRESS TEST  11/11/2009   normal study   CARPAL TUNNEL RELEASE  08/1993   left wrist   CARPAL TUNNEL RELEASE  01/1997   right   CATARACT EXTRACTION, BILATERAL  2021   CORONARY ARTERY BYPASS GRAFT  07/1994   Triple by Dr Ceasar Mons   CORONARY STENT INTERVENTION N/A 02/02/2021   Procedure: CORONARY STENT INTERVENTION;  Surgeon: Nelva Bush, MD;  Location: [place] CV LAB;  Service: Cardiovascular;  Laterality: N/A;   CORONARY STENT INTERVENTION N/A 06/26/2021   Procedure: CORONARY STENT INTERVENTION;  Surgeon: Sherren Mocha, MD;  Location: [place] CV LAB;  Service: Cardiovascular;  Laterality: N/A;   dental implants     EYE SURGERY  03/2020   cataract   HAND SURGERY Right 09/2002   Right hand pulley release   INTRAVASCULAR ULTRASOUND/IVUS N/A 06/26/2021   Procedure: Intravascular Ultrasound/IVUS;  Surgeon: Sherren Mocha, MD;  Location: [place] CV LAB;  Service: Cardiovascular;  Laterality: N/A;   INTRAVASCULAR ULTRASOUND/IVUS N/A 09/03/2021   Procedure: Intravascular Ultrasound/IVUS;  Surgeon: Early Osmond, MD;  Location: [place] CV LAB;  Service: Cardiovascular;  Laterality: N/A;   LAMINECTOMY WITH POSTERIOR LATERAL ARTHRODESIS LEVEL 1 N/A 03/24/2022   Procedure: LAMINECTOMY, FORAMINOTOMY, LUMBAR TWO- THREE ; INTERTRANSVERSE FUSION LUMBAR TWO - LUMBAR THREE RIGHT;  Surgeon: Eustace Moore, MD;  Location: [place];  Service: Neurosurgery;  Laterality: N/A;   LEFT HEART CATH AND CORONARY ANGIOGRAPHY N/A 06/26/2021   Procedure: LEFT HEART CATH AND CORONARY ANGIOGRAPHY;  Surgeon: Sherren Mocha, MD;  Location: [place] CV LAB;  Service: Cardiovascular;  Laterality:  N/A;   LEFT HEART CATH AND CORS/GRAFTS ANGIOGRAPHY N/A 02/02/2021   Procedure: LEFT HEART CATH AND CORS/GRAFTS ANGIOGRAPHY;  Surgeon: Nelva Bush, MD;  Location: [place] CV LAB;  Service: Cardiovascular;  Laterality: N/A;   LEFT HEART CATH AND CORS/GRAFTS ANGIOGRAPHY N/A 09/03/2021   Procedure: LEFT HEART CATH AND CORS/GRAFTS ANGIOGRAPHY;  Surgeon: Early Osmond, MD;  Location: [place] CV LAB;  Service: Cardiovascular;  Laterality: N/A;   LEFT HEART CATH AND CORS/GRAFTS ANGIOGRAPHY N/A 07/15/2022   Procedure: LEFT HEART CATH AND CORS/GRAFTS ANGIOGRAPHY;  Surgeon: Martinique, Peter M, MD;  Location: [place] CV LAB;  Service: Cardiovascular;  Laterality: N/A;   LEFT HEART CATHETERIZATION WITH CORONARY/GRAFT ANGIOGRAM N/A 06/11/2013   Procedure: LEFT HEART CATHETERIZATION WITH [name];  Surgeon: Thayer Headings, MD;  [facility] C[facility] LAB;  Service: Cardiovascular;  Laterality: N/A;   MULTIPLE TOOTH EXTRACTIONS  2000   All upper teeth.  Has full dneture.    RENAL ARTERY STENT  2008   SHOULDER ARTHROSCOPY WITH ROTATOR CUFF REPAIR Right 09/27/2018   Procedure: Right shoulder mini open rotator cuff repair;  Surgeon: Susa Day, MD;  Location: [facility];  Service: Orthopedics;  Laterality: Right;  90 mins   thyroid cyst apiration  05/1997 and 07/1998   TONSILECTOMY, ADENOIDECTOMY, BILATERAL MYRINGOTOMY AND TUBES  1989   TONSILLECTOMY     TOTAL ABDOMINAL HYSTERECTOMY  1992    FAMILY HISTORY: Family History  Problem Relation Age of Onset   Heart disease Mother    Heart attack Mother    Heart disease Father        before age 22   Heart attack Father  Heart disease Sister        before age 21   Diabetes Sister    Hyperlipidemia Sister    Hypertension Sister    Heart attack Sister    Liver cancer Sister    Breast cancer Sister    Heart disease Brother    Diabetes Brother    Hyperlipidemia Brother    Hypertension Brother    Heart attack Brother    AAA  (abdominal aortic aneurysm) Brother    Colon cancer Paternal Grandmother    Heart disease Brother    Colon cancer Cousin        first cousin on father's side   Pancreatic cancer Neg Hx    Rectal cancer Neg Hx    Stomach cancer Neg Hx    Esophageal cancer Neg Hx     SOCIAL HISTORY: Social History   Socioeconomic History   Marital status: Married    Spouse name: Not on file   Number of children: 0   Years of education: Not on file   Highest education level: Not on file  Occupational History   Not on file  Tobacco Use   Smoking status: Former    Packs/day: 0.00    Years: 37.00    Total pack years: 0.00    Types: Cigarettes    Quit date: 12/07/1995    Years since quitting: 27.1    Passive exposure: Never   Smokeless tobacco: Never  Vaping Use   Vaping Use: Never used  Substance and Sexual Activity   Alcohol use: Yes    Alcohol/week: 6.0 - 10.0 standard drinks of alcohol    Types: 6 - 10 Glasses of wine per week    Comment: 2-3 glasses daily   Drug use: No   Sexual activity: Not Currently    Partners: Male  Other Topics Concern   Not on file  Social History Narrative   Lives at home with husband.  Is retired.  Education 4 yrs of college.  No children.   Caffiene 2 cups daily. Right handed    Social Determinants of Health   Financial Resource Strain: Low Risk  (06/13/2020)   Overall Financial Resource Strain (CARDIA)    Difficulty of Paying Living Expenses: Not hard at all  Food Insecurity: No Food Insecurity (07/28/2022)   Hunger Vital Sign    Worried About Running Out of Food in the Last Year: Never true    Ran Out of Food in the Last Year: Never true  Transportation Needs: No Transportation Needs (07/28/2022)   PRAPARE - Hydrologist (Medical): No    Lack of Transportation (Non-Medical): No  Physical Activity: Insufficiently Active (06/13/2020)   Exercise Vital Sign    Days of Exercise per Week: 4 days    Minutes of Exercise per Session:  10 min  Stress: No Stress Concern Present (06/13/2020)   [place]    Feeling of Stress : Only a little  Social Connections: Moderately Isolated (06/13/2020)   Social Connection and Isolation Panel [NHANES]    Frequency of Communication with Friends and Family: More than three times a week    Frequency of Social Gatherings with Friends and Family: Three times a week    Attends Religious Services: Never    Active Member of Clubs or Organizations: No    Attends Archivist Meetings: Never    Marital Status: Married  Human resources officer Violence: Not At Risk (  06/13/2020)   Humiliation, Afraid, Rape, and Kick questionnaire    Fear of Current or Ex-Partner: No    Emotionally Abused: No    Physically Abused: No    Sexually Abused: No     PHYSICAL EXAM  GENERAL EXAM/CONSTITUTIONAL: Vitals:  Vitals:   01/31/23 1416  BP: 128/61  Pulse: 74  Weight: 136 lb 6.4 oz (61.9 kg)  Height: '4\' 11"'$  (1.499 m)   Body mass index is 27.55 kg/m. Wt Readings from Last 3 Encounters:  01/31/23 136 lb 6.4 oz (61.9 kg)  01/31/23 136 lb 3.2 oz (61.8 kg)  01/17/23 135 lb 1.6 oz (61.3 kg)   Patient is in no distress; well developed, nourished and groomed; neck is supple  CARDIOVASCULAR: Examination of carotid arteries is normal; no carotid bruits Regular rate and rhythm, no murmurs Examination of peripheral vascular system by observation and palpation is normal  EYES: Ophthalmoscopic exam of optic discs and posterior segments is normal; no papilledema or hemorrhages No results found.  MUSCULOSKELETAL: Gait, strength, tone, movements noted in Neurologic exam below  NEUROLOGIC: MENTAL STATUS:     01/25/2019   10:28 AM  MMSE - Mini Mental State Exam  Orientation to time 5  Orientation to Place 5  Registration 3  Attention/ Calculation 5  Recall 0  Language- name 2 objects 2  Language- repeat 1  Language- follow 3 step  command 3  Language- read & follow direction 1  Write a sentence 1  Copy design 1  Total score 27   awake, alert, oriented to person, place and time recent and remote memory intact normal attention and concentration language fluent, comprehension intact, naming intact fund of knowledge appropriate  CRANIAL NERVE:  2nd - no papilledema on fundoscopic exam 2nd, 3rd, 4th, 6th - pupils equal and reactive to light, visual fields full to confrontation, extraocular muscles intact, no nystagmus 5th - facial sensation symmetric 7th - facial strength symmetric 8th - hearing intact 9th - palate elevates symmetrically, uvula midline 11th - shoulder shrug symmetric 12th - tongue protrusion midline  MOTOR:  normal bulk and tone, full strength in the BUE, BLE; EXCEPT BILATERAL HIP FLEXOR WEAKNESS (3+/5)  SENSORY:  normal and symmetric to light touch, temperature, vibration  COORDINATION:  finger-nose-finger, fine finger movements normal  REFLEXES:  deep tendon reflexes TRACE and symmetric  GAIT/STATION:  narrow based gait; SLOW AND CAUTIOUS     DIAGNOSTIC DATA (LABS, IMAGING, TESTING) - I reviewed patient records, labs, notes, testing and imaging myself where available.  Lab Results  Component Value Date   WBC 10.5 12/22/2022   HGB 14.3 12/22/2022   HCT 41.0 12/22/2022   MCV 91.1 12/22/2022   PLT 233.0 12/22/2022      Component Value Date/Time   NA 142 12/22/2022 1110   NA 143 06/29/2022 1538   K 3.8 12/22/2022 1110   CL 104 12/22/2022 1110   CO2 29 12/22/2022 1110   GLUCOSE 103 (H) 12/22/2022 1110   BUN 9 12/22/2022 1110   BUN 7 (L) 06/29/2022 1538   CREATININE 0.69 12/22/2022 1110   CREATININE 0.71 06/29/2016 1057   CALCIUM 9.6 12/22/2022 1110   PROT 6.0 12/22/2022 1110   PROT 5.3 (L) 04/05/2022 1150   ALBUMIN 4.0 12/22/2022 1110   ALBUMIN 3.7 04/05/2022 1150   AST 22 12/22/2022 1110   ALT 22 12/22/2022 1110   ALKPHOS 54 12/22/2022 1110   BILITOT 0.9  12/22/2022 1110   BILITOT 0.4 04/05/2022 1150   GFRNONAA >60 07/11/2022  [place] 89 01/01/2020 0835   Lab Results  Component Value Date   CHOL 148 12/22/2022   HDL 63.60 12/22/2022   LDLCALC 58 12/22/2022   LDLDIRECT 82.0 06/13/2020   TRIG 130.0 12/22/2022   CHOLHDL 2 12/22/2022   Lab Results  Component Value Date   HGBA1C 6.1 12/22/2022   Lab Results  Component Value Date   VITAMINB12 1,262 (H) 01/09/2021   Lab Results  Component Value Date   TSH 1.86 12/23/2022    09/04/20 MRI lumbar spine [I reviewed images myself and agree with interpretation. -VRP]  - interval fusion L3-L5 - L3 compression fracture - moderate spinal stenosis at L2-3 - moderate biforaminal stenosis L2-3, L4-5  09/30/20 MRI brain - Stable compared to 2018.  No specific explanation for symptoms.  09/30/20  MRI cervical spine - Mild degenerative spinal stenosis at C5-6 without cord compression or signal abnormality to explain altered gait.  01/07/21 MRI brain [I reviewed images myself and agree with interpretation. -VRP]  1. No acute intracranial abnormality. 2. 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    ASSESSMENT AND PLAN  77 y.o. year old female here with history of severe spinal stenosis at L4-5 status post decompression and fusion from L3-L5 levels.  Now with worsening spinal stenosis at L2-3 level.  Gait difficulty likely related to hip flexor weakness associated with L2-3 spinal stenosis.  Dx:  1. Nystagmus   2. Central positional vertigo     PLAN:  INTERMITTENT VERTIGO, DIZZINESS (likely benign positional vertigo vs post-infx labyrinthitis) - not much symptoms at home currently; some symptoms during PT sessions - check MRI brain (rule out central causes of imbalance / vestibular dysfunction)  GAIT DIFFICULTY / HIP FLEXOR WEAKNESS / LOW BACK PAIN - no other signs of brain, cervical spine or peripheral neuropathy localization -  encouraged to restart personal trainer / physical therapy  UPPER AND LOWER LIP NUMBNESS (may be related to denture fitting) - gabapentin '300mg'$  twice a day   MIGRAINE WITH AURA (resolved) - use tylenol / ibuprofen as needed for breakthrough headache  Orders Placed This Encounter  Procedures   MR BRAIN/IAC W WO CONTRAST   Return for pending if symptoms worsen or fail to improve, pending test results.    Penni Bombard, MD [id number], [id number] PM Certified in Neurology, Neurophysiology and Neuroimaging  [facility] [address], [place], [place] [address] [phone]

## 2023-01-31 NOTE — Patient Instructions (Signed)
Medication Instructions:  Your physician has recommended you make the following change in your medication:   -Increase amlodipine (norvasc) to '5mg'$  once daily.  *If you need a refill on your cardiac medications before your next appointment, please call your pharmacy*   Lab Work: Your physician recommends that you return for lab work in: 6 months for FASTING CMET & lipid  If you have labs (blood work) drawn today and your tests are completely normal, you will receive your results only by: [place] (if you have MyChart) OR A paper copy in the mail If you have any lab test that is abnormal or we need to change your treatment, we will call you to review the results.   Follow-Up: At [facility], you and your health needs are our priority.  As part of our continuing mission to provide you with exceptional heart care, we have created designated Provider Care Teams.  These Care Teams include your primary Cardiologist (physician) and Advanced Practice Providers (APPs -  Physician Assistants and Nurse Practitioners) who all work together to provide you with the care you need, when you need it.  We recommend signing up for the patient portal called "MyChart".  Sign up information is provided on this After Visit Summary.  MyChart is used to connect with patients for Virtual Visits (Telemedicine).  Patients are able to view lab/test results, encounter notes, upcoming appointments, etc.  Non-urgent messages can be sent to your provider as well.   To learn more about what you can do with MyChart, go to [email or web address].    Your next appointment:   6 month(s)  Provider:   [name], MD

## 2023-01-31 NOTE — Patient Instructions (Signed)
-   check MRI brain (rule out central causes of imbalance / vestibular dysfunction)

## 2023-02-01 ENCOUNTER — Ambulatory Visit: Payer: Medicare Other

## 2023-02-01 NOTE — Therapy (Unsigned)
[facility] [address] [place], [place], [address] Phone: [phone]   Fax:  [phone]  Patient Details  Name: Megan [name] MRN: [id number] Date of Birth: [birth year] Referring Provider:  No ref. provider found  Encounter Date: 02/01/2023  PHYSICAL THERAPY DISCHARGE SUMMARY  Visits from Start of Care: 8  Current functional level related to goals / functional outcomes: Per patient report, still with intermittent dizziness. 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 Per patient, at neuro appt, MD did not put patient in dix hallpike position and thus did not observe downbeating nystagmus.    Remaining deficits: Intermittent dizziness; per MD: gait deficits due to hip flexor weakness (does not explain imbalance/dizziness)    Education / Equipment: PT POC, HEP, central signs, need for further work up   Patient agrees to discharge. Patient goals were  unable to be assessed . Patient is being discharged due to lack of progress. Patient has appt at [facility] early April and may return afterward with new referral should PT be indicated at that time.   Debbora Dus, PT Debbora Dus, PT, DPT, CBIS  02/01/2023, 8:19 AM  [facility] [address] [place], [place], [address] Phone: [phone]   Fax:  [phone]

## 2023-02-02 DIAGNOSIS — M7061 Trochanteric bursitis, right hip: Secondary | ICD-10-CM | POA: Diagnosis not present

## 2023-02-02 DIAGNOSIS — M7062 Trochanteric bursitis, left hip: Secondary | ICD-10-CM | POA: Diagnosis not present

## 2023-02-07 ENCOUNTER — Ambulatory Visit: Payer: Medicare Other | Admitting: Family Medicine

## 2023-02-15 ENCOUNTER — Encounter: Payer: Self-pay | Admitting: Cardiology

## 2023-02-15 NOTE — Telephone Encounter (Signed)
Patient states SOB this morning and used albuterol inhaler. O2 sats were 83% at the time. States breathing improved after inhaler use.  No further episodes of SOB, although she states she is SOB when lying downShe states that she received her Booster on Friday and fine until yesterday when she started with runny nose and scratchy throat.  Today she is "wobbly".  Pulse Ox on  while on the phone is 88% and with deep breaths and sitting yields 92%. Patient took covid test at this time and negative result. She states while ambulating her O2 will be 83-88%.  Has productive cough with thick  "creamy" mucous which is no different from her usual as being a former smoker . BP usually runs 13/140 t/80's until today.

## 2023-02-17 ENCOUNTER — Telehealth: Payer: Self-pay | Admitting: Primary Care

## 2023-02-17 ENCOUNTER — Ambulatory Visit (INDEPENDENT_AMBULATORY_CARE_PROVIDER_SITE_OTHER): Payer: Medicare Other

## 2023-02-17 ENCOUNTER — Encounter: Payer: Self-pay | Admitting: Primary Care

## 2023-02-17 ENCOUNTER — Ambulatory Visit: Payer: Medicare Other | Admitting: Primary Care

## 2023-02-17 VITALS — BP 128/60 | HR 82 | Temp 98.5°F | Ht 60.0 in | Wt 133.0 lb

## 2023-02-17 DIAGNOSIS — R059 Cough, unspecified: Secondary | ICD-10-CM | POA: Diagnosis not present

## 2023-02-17 DIAGNOSIS — J432 Centrilobular emphysema: Secondary | ICD-10-CM | POA: Diagnosis not present

## 2023-02-17 DIAGNOSIS — R531 Weakness: Secondary | ICD-10-CM | POA: Diagnosis not present

## 2023-02-17 DIAGNOSIS — J4489 Other specified chronic obstructive pulmonary disease: Secondary | ICD-10-CM | POA: Diagnosis not present

## 2023-02-17 DIAGNOSIS — J189 Pneumonia, unspecified organism: Secondary | ICD-10-CM

## 2023-02-17 DIAGNOSIS — J9601 Acute respiratory failure with hypoxia: Secondary | ICD-10-CM | POA: Diagnosis not present

## 2023-02-17 DIAGNOSIS — J9610 Chronic respiratory failure, unspecified whether with hypoxia or hypercapnia: Secondary | ICD-10-CM | POA: Insufficient documentation

## 2023-02-17 LAB — CBC WITH DIFFERENTIAL/PLATELET
Basophils Absolute: 0 10*3/uL (ref 0.0–0.1)
Basophils Relative: 0.1 % (ref 0.0–3.0)
Eosinophils Absolute: 0.1 10*3/uL (ref 0.0–0.7)
Eosinophils Relative: 0.9 % (ref 0.0–5.0)
HCT: 41.7 % (ref 36.0–46.0)
Hemoglobin: 14.4 g/dL (ref 12.0–15.0)
Lymphocytes Relative: 7.8 % — ABNORMAL LOW (ref 12.0–46.0)
Lymphs Abs: 1.2 10*3/uL (ref 0.7–4.0)
MCHC: 34.5 g/dL (ref 30.0–36.0)
MCV: 89.3 fl (ref 78.0–100.0)
Monocytes Absolute: 0.9 10*3/uL (ref 0.1–1.0)
Monocytes Relative: 6.2 % (ref 3.0–12.0)
Neutro Abs: 12.8 10*3/uL — ABNORMAL HIGH (ref 1.4–7.7)
Neutrophils Relative %: 85 % — ABNORMAL HIGH (ref 43.0–77.0)
Platelets: 259 10*3/uL (ref 150.0–400.0)
RBC: 4.66 Mil/uL (ref 3.87–5.11)
RDW: 12.8 % (ref 11.5–15.5)
WBC: 15 10*3/uL — ABNORMAL HIGH (ref 4.0–10.5)

## 2023-02-17 LAB — BASIC METABOLIC PANEL
BUN: 19 mg/dL (ref 6–23)
CO2: 25 mEq/L (ref 19–32)
Calcium: 9.9 mg/dL (ref 8.4–10.5)
Chloride: 103 mEq/L (ref 96–112)
Creatinine, Ser: 1.16 mg/dL (ref 0.40–1.20)
GFR: 45.17 mL/min — ABNORMAL LOW (ref >60.00)
Glucose, Bld: 119 mg/dL — ABNORMAL HIGH (ref 70–99)
Potassium: 4.1 mEq/L (ref 3.5–5.1)
Sodium: 138 mEq/L (ref 135–145)

## 2023-02-17 LAB — D-DIMER, QUANTITATIVE: D-Dimer, Quant: 2.95 mcg/mL FEU — ABNORMAL HIGH (ref <0.50)

## 2023-02-17 LAB — BRAIN NATRIURETIC PEPTIDE: Pro B Natriuretic peptide (BNP): 145 pg/mL — ABNORMAL HIGH (ref 0.0–100.0)

## 2023-02-17 MED ORDER — LEVOFLOXACIN 500 MG PO TABS: 500.0000 mg | ORAL_TABLET | Freq: Every day | ORAL | 0 refills | Status: DC

## 2023-02-17 MED ORDER — ALBUTEROL SULFATE (2.5 MG/3ML) 0.083% IN NEBU: 2.5000 mg | INHALATION_SOLUTION | Freq: Four times a day (QID) | RESPIRATORY_TRACT | 5 refills | Status: AC | PRN

## 2023-02-17 NOTE — Telephone Encounter (Signed)
Tonita Cong, [place]; [place], [place]er; Mariann Barter Received, I am not seeing her walk test results from today OV. I have called in a triage message also,  Thanks Raven!     Not seeing walk test in chart, please advise

## 2023-02-17 NOTE — Telephone Encounter (Signed)
Patient states oxygen tank just about empty. Would like to discuss. Patient phone number is [phone].

## 2023-02-17 NOTE — Telephone Encounter (Signed)
Mardene Celeste states needs walk test results for patient's oxygen. Mardene Celeste phone number is [phone].

## 2023-02-17 NOTE — Patient Instructions (Addendum)
Hypoxia was mild and due to your advanced emphysema, you need to wear oxygen 2L with any exertion and at bedtime  CXR showed possible pneumonia on right, sending in Levaquin antibiotic   Continue Breztri Aerosphere two puffs twice daily   Start taking mucinex '600mg'$  morning and evening   Start albuterol nebulizer every 4-6 hours (use morning, afternoon and evening)  If respiratory symptoms decline or you are not getting better you need to go to ED for evaluation  Recommendations: Wear 2L oxygen with exertion and at bedtime  Continue Breztri Aerosphere two puffs twice daily Use Albuterol every 4-6 hours for shortness of breath or wheezing   Orders: CXR and Labs today  Oxygen start at 2L with activity/bedtime   Follow-up: Early next week with either Dr. Silas Flood or [facility] NP  Home Oxygen Use, Adult When a medical condition keeps you from getting enough oxygen, your health care provider may have you use extra oxygen at home. Your health care provider will let you know: When to use oxygen. How much oxygen to use. The amount is set in liters per minute (LPM or L/M). How long to use oxygen. Home oxygen can be given through: A mask. This covers the nose and mouth or covers a tracheotomy tube. A nasal cannula. This is a device or tube that goes in the nostrils. A transtracheal catheter. This is a small, thin tube placed through the neck and into the windpipe (trachea). A breathing tube (tracheostomy tube) that is surgically placed in the windpipe. These devices have tubing that connects to an oxygen source, such as: A tank. Tanks hold oxygen in gas form. The tank must be replaced when the oxygen is used up. A liquid oxygen device. This holds oxygen in liquid form. This must be replaced when the oxygen is used up. An oxygen concentrator machine. This filters oxygen in the room. It uses electricity so you must have a backup oxygen tank in case the power goes out. Work with your health care  provider to find equipment that works best for you and your lifestyle. What are the risks? Your health care provider will talk with you about risks. These may include: Fire. This can happen if the oxygen is exposed to a heat source, flame, or spark. Injury to the skin. This can happen if liquid oxygen touches the skin. Pressure sores may occur if the oxygen tubing presses on the skin. Damage to the lungs or other organs. This can happen from getting too little or too much oxygen. Supplies needed: To use oxygen, you will need: A mask, nasal cannula, transtracheal catheter, or tracheostomy. An oxygen tank, a liquid oxygen device, or an oxygen concentrator. Your health care provider may recommend: A humidifier. This device adds moisture to the oxygen. A pulse oximeter. This device measures the percentage of oxygen in your blood. How to use oxygen You will be shown how to use your oxygen device. Follow the instructions, which may look something like this: Wash your hands with soap and water for at least 20 seconds. Turn on the oxygen. Make sure the oxygen unit is working right. To do this: Place the end of the oxygen tubing in a cup of water before connecting it to the nasal cannula, mask, or transtracheal catheter. The water will bubble if oxygen is flowing. Place one end of the oxygen tubing into the port on the tank, device, or machine. Connect the other end to the nasal cannula, mask, or transtracheal catheter. Turn the liter-flow  setting on the machine to the level you are told. Place the nasal cannula in your nose, or place the mask over your mouth and nose or tracheostomy tube. Turn off the oxygen when you are not using it. How to clean and care for the oxygen supplies Clean or replace your oxygen equipment and supplies as told by the medical device company that supplies the equipment. Safety tips Fire safety tips  Keep your oxygen and oxygen supplies at least 6 ft (2 m) away from  sources of heat, flames, and sparks at all times. Do not allow smoking near your oxygen. Put up "no smoking" signs in your home. Avoid smoking areas in public. Do not use materials that can burn (are flammable) while you use oxygen. This includes: Petroleum jelly. Hand sanitizer. Rubbing alcohol. Hair spray or other aerosol sprays. Keep a Data processing manager nearby. Tell your fire department that you have oxygen in your home. Test your home smoke detectors often. Traveling Secure your oxygen tank in the vehicle so that it does not move. Follow instructions from your medical device company about how to safely secure your tank. Have enough oxygen for the amount of time you will be away from home. If you plan to travel by public transportation such as airplane, train, bus, or boat, contact the company to arrange a portable oxygen delivery system. You may need documents from your health care provider and medical device company before you travel. General safety tips Keep extra supplies on hand, including extra tubing and an extra cannula or mask. If you use an oxygen cylinder, keep it in a stand or secure it to an object that will not move. If you use liquid oxygen, always keep the container upright. If you use an oxygen concentrator: Tell Loss adjuster, chartered company. Make sure you are given priority service if your power goes out. Avoid using extension cords if possible. Keep an extra supply of backup oxygen tanks. Follow these instructions at home: Use oxygen only as told by your health care provider. Do not use alcohol or other drugs that make you relax (sedating drugs) unless told. They can slow down your breathing rate and make it hard to get in enough oxygen. Know how and when to order a refill of oxygen. Plan for holidays when you may not be able to get a prescription filled. Use water-based lubricants on your lips or nostrils. Do not use oil-based products like petroleum jelly. Ask your health  care provider how to prevent skin irritation on your cheeks or behind your ears. Contact a health care provider if: You are more tired than normal and have little energy. You have dry or irritated skin in your nose or on your face. You have nosebleeds. You are restless, irritable, or anxious. You get headaches often. You are not sleeping well. Get help right away if: You have trouble breathing. You are confused. You are sleepy all the time. You have blue lips or fingernails. These symptoms may be an emergency. Get help right away. Call 911. Do not wait to see if the symptoms will go away. Do not drive yourself to the hospital. This information is not intended to replace advice given to you by your health care provider. Make sure you discuss any questions you have with your health care provider. Document Revised: 06/22/2022 Document Reviewed: 06/22/2022 Elsevier Patient Education  [place].

## 2023-02-17 NOTE — Assessment & Plan Note (Addendum)
-   Patient developed cough after getting covid booster last week - CXR with hazy retrocardiac opacity is suspicious for infection  - Rx Levaquin 500mg  daily x 7 days  - Continue bronchodilators as scheduled and advised patient take mucinex twice daily - Getting basic labs today  - FU in 3-5 days or sooner/ patient declined ED evaluation, she is in stable condition for outpatient treatment. Advise if symptoms do not improve or worsen to present to ED

## 2023-02-17 NOTE — Addendum Note (Signed)
Addended byOralia Rud M on: 02/17/2023 11:34 AM   Modules accepted: Orders

## 2023-02-17 NOTE — Telephone Encounter (Signed)
Called and spoke with patient. She stated that she was qualified for oxygen today and her tank is almost empty. She was under the impression that she would receive her O2 today. I advised her that [employer] was working on her order and needed more information from our office. She is aware that the information is being put into her chart.   While on the phone, she also mentioned that her albuterol nebulizer solution and machine was not ordered. I apologized to her and advised her that I would take care of this as well. She verbalized understanding.   Nothing further needed at time of call.

## 2023-02-17 NOTE — Assessment & Plan Note (Signed)
-   Continue Breztri Aerosphere two puffs morning and evening - Use Albuterol 2 puffs every 4-6 hours as needed for breakthrough shortness of breath/wheezing

## 2023-02-17 NOTE — Assessment & Plan Note (Signed)
-   Due to severe underlying emphysema and pneumonia  - O2 88% with ambulation on room air, improved to 94% on 2L  - Started on 2L oxygen to use with exertion and at bedtime

## 2023-02-17 NOTE — Assessment & Plan Note (Addendum)
-   Patient has severe emphysema on CT imaging - New oxygen requirements - Needs updated pulmonary function testing

## 2023-02-17 NOTE — Telephone Encounter (Signed)
Called and spoke with Mardene Celeste. I advised her that I spoke with Amy and she stated that she would update the patient's chart. Mardene Celeste stated that as long as they have the information before 5, she will be able to get her oxygen today.

## 2023-02-17 NOTE — Progress Notes (Signed)
$'@Patient'h$  ID: Guerry Minors, female    DOB: 1946-05-01, 77 y.o.   MRN: [id number]  Chief Complaint  Patient presents with   Acute Visit    Decreased SaO2 since 02/14/23.  Dropping into the low 80"s     Referring provider: [name], MD  HPI: 78 y.o. whom are seeing in follow-up for evaluation of dyspnea on exertion related to emphysema and likely cardiac causes.  Most recent PCP note reviewed.  Previous [facility] pulmonary encounter:  12/14/22- Dr. Silas Flood  Returns for routine follow-up.  At last visit she felt breast he was giving her some benefit in terms of her breathing.  She still feels this way.  However, she is complaining of bruising or purpuric lesions on extremities.  Suspect is likely due to her dual antiplatelet therapy and thin skin but she is convinced it is related to the steroid.  I informed her she is on an inhaled corticosteroid is unlikely because significant side effects.  However, she would like to try something different to see if it helps with the skin changes.  We reviewed at length her serial CT scans 06/24/2021 and 07/25/2022 shows similar degree of 3 significant emphysema on my review and interpretation,  HPI at initial visit: She was in her usual state of health.  She developed onset of shortness of breath a few weeks ago.  Different than baseline dyspnea.  Recurrent shorter distances.  Preceded with aches, sounds like viral infection.  Still some heart ache as well which is concerning to her given her early CAD and significant coronary history.  This prompted presentation to the ED.  There CTA PE protocol was negative for PE but showed presevere emphysematous changes with predilection for the upper lobes otherwise clear on my review and interpretation.  She subsequent underwent a left heart catheterization showed stable coronary artery disease and prior PCI and CABG.  This was reassuring.  In the interim she was placed on Breztri.  She flexes helped some with her  shortness of breath.  Able to do more.  Still get a little bit winded on longer walks etc.  She used to walk a mile and a half a day.  She is working back to getting to that distance.  PMH: Tobacco abuse in remission, hypertension, CAD, asthma, hyperlipidemia, seasonal allergies Surgical history: Back surgery, CABG, PCI Family history: Mother and father with CAD Social history: Former smoker, quit 1997, lives in [place]   02/17/2023- Interim hx  Patient presents today for acute OV due to low oxygen levels.   Patient has advanced emphysematous changes of her lungs on CT imaging from August 2023. No recent PFTs. She is maintained on [employer].  Patient received COVID booster 6 days ago. She developed chills two days later along with lower BP reading. She contacted her cardiologist and was instructed to hold one of her blood pressure medications. She has a chronic cough, she is getting up alittle more mucus than normal. Shortness of breath is baseline for the most part. She is not in any distress.      Allergies  Allergen Reactions   Brilinta [Ticagrelor] Shortness Of Breath   Bextra [Valdecoxib] Other (See Comments)    Increased lft's    Hydrocod Poli-Chlorphe Poli Er Rash   Lipitor [Atorvastatin] Other (See Comments)    Leg weakness    Mevacor [Lovastatin] Other (See Comments)    Muscle weakness    Plavix [Clopidogrel Bisulfate] Itching and Other (See Comments)  Hands and feet tingle and itch   Zocor [Simvastatin] Other (See Comments)    Bones hurt    Doxycycline Diarrhea and Nausea And Vomiting   Metoprolol Other (See Comments)    Hair loss   Morphine And Related Itching and Nausea Only   Codeine Itching and Rash    Hyper-active   Lisinopril Cough    Immunization History  Administered Date(s) Administered   Covid-19, Mrna,Vaccine(Spikevax)61yr and older 02/11/2023   Fluad Quad(high Dose 65+) 07/30/2019, 08/10/2022   Influenza, High Dose Seasonal PF  08/13/2017, 08/26/2018, 09/15/2020   Influenza, Seasonal, Injecte, Preservative Fre 09/21/2014, 09/06/2015, 07/21/2016   Influenza,inj,quad, With Preservative 08/06/2017   Influenza-Unspecified 08/10/2017, 10/13/2021   PFIZER(Purple Top)SARS-COV-2 Vaccination 12/22/2019, 01/12/2020, 09/15/2020   PPD Test 09/13/1979, 03/10/2021   Pfizer Covid-19 Vaccine Bivalent Booster 147yr& up 10/13/2021   Pneumococcal Conjugate-13 12/31/2016   Pneumococcal Polysaccharide-23 12/16/2014, 08/01/2019   Respiratory Syncytial Virus Vaccine,Recomb Aduvanted(Arexvy) 10/06/2022   Td 05/13/2008   Td (Adult), 2 Lf Tetanus Toxid, Preservative Free 05/13/2008   Tdap 08/09/2012   Zoster Recombinat (Shingrix) 01/13/2018, 03/14/2018   Zoster, Live 11/03/2010    Past Medical History:  Diagnosis Date   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 s/p stent grafting in 2009.   Adenomatous colon polyp 05/2001   Adenomatous duodenal polyp    Allergy    Anginal pain (HCC)    COPD (chronic obstructive pulmonary disease) ([facility]   pt denies COPD   Coronary artery disease    a. s/p CABG in 1995;  b. 05/2013 Neg MV, EF 82%;  c. 06/2013 Cath: LM nl, LAD 40-50p, LCX nl, RCA 80p/10054mG->RCA->PDA->PLSA min irregs, LIMA->LAD atretic, vigorous LV fxn.   Diabetes mellitus without complication (HCC)    history of, resolved. 2017   Diverticulosis    Eczema, dyshidrotic    Eczema, dyshidrotic 1986   [facility]Mathis [facility]  Fatty liver    Fibromyalgia    GERD (gastroesophageal reflux disease)    Hyperlipidemia    Hypertension    Migraine    [facility]  Osteoarthritis    Osteoporosis    unsure, having a bone scan soon   Peripheral arterial disease ([facility]  left leg diagnosed [facility] Dr BraMare FerrariRenal [facility]  a. 2008 s/p PTA    Tobacco History: Social History   Tobacco Use  Smoking Status Former   Packs/day: 0.00   Years: 37.00   Additional pack years: 0.00    Total pack years: 0.00   Types: Cigarettes   Quit date: 12/07/1995   Years since quitting: 27.2   Passive exposure: Never  Smokeless Tobacco Never   Counseling given: Not Answered   Outpatient Medications Prior to Visit  Medication Sig Dispense Refill   Accu-Chek FastClix Lancets MISC Check blood sugar once daily PRN 100 each 1   albuterol (PROAIR HFA) 108 (90 Base) MCG/ACT inhaler Inhale 2 puffs into the lungs every 6 (six) hours as needed for wheezing or shortness of breath. 1 each 11   ALPRAZolam (XANAX) 0.25 MG tablet Take 1 tablet (0.25 mg total) by mouth at bedtime as needed for sleep. (Patient taking differently: Take 0.25 mg by mouth daily as needed for sleep or anxiety.) 90 tablet 3   amLODipine (NORVASC) 5 MG tablet Take 1 tablet (5 mg total) by mouth daily. 90 tablet 3   aspirin EC 81 MG tablet  Take 1 tablet (81 mg total) by mouth daily. 90 tablet 3   atorvastatin (LIPITOR) 80 MG tablet TAKE 1 TABLET BY MOUTH EVERY DAY 90 tablet 3   B Complex-Biotin-FA (B-100 COMPLEX PO) Take 1 tablet by mouth every evening.     bismuth subsalicylate (PEPTO BISMOL) 262 MG/15ML suspension Take 30 mLs by mouth every 6 (six) hours as needed for indigestion or diarrhea or loose stools.     Budeson-Glycopyrrol-Formoterol (BREZTRI AEROSPHERE) 160-9-4.8 MCG/ACT AERO Inhale 2 puffs into the lungs in the morning and at bedtime. 1 each 11   cetirizine (ZYRTEC) 10 MG tablet Take 10 mg by mouth daily as needed for allergies.     cholecalciferol (VITAMIN D3) 25 MCG (1000 UNIT) tablet Take 1,000 Units by mouth every evening.     CORDRAN 4 MCG/SQCM TAPE Apply 1 each topically daily as needed (Rash).      cycloSPORINE (RESTASIS) 0.05 % ophthalmic emulsion Place 1 drop into both eyes 2 (two) times daily.      Ergocalciferol 50 MCG (2000 UT) CAPS Take by mouth.     ezetimibe (ZETIA) 10 MG tablet TAKE 1 TABLET BY MOUTH EVERY DAY 90 tablet 3   gabapentin (NEURONTIN) 300 MG capsule Take 1 capsule (300 mg total) by  mouth 3 (three) times daily. (Patient taking differently: Take 300 mg by mouth 2 (two) times daily.) 270 capsule 3   glucose blood (ACCU-CHEK SMARTVIEW) test strip CHECK BLOOD SUGAR ONCE DAILY AS NEEDED 100 strip 1   hydrochlorothiazide (MICROZIDE) 12.5 MG capsule Take 1 capsule (12.5 mg total) by mouth daily. 90 capsule 3   isosorbide mononitrate (IMDUR) 30 MG 24 hr tablet Take 3 tablets ( 90 mg ) every morning and 1 tablet (30 mg ) every evening (Patient taking differently: Take 30-90 mg by mouth See admin instructions. Take 3 tablets ( 90 mg ) every morning and 1 or 2 tablets (30 - 60 mg ) every evening) 360 tablet 3   losartan (COZAAR) 100 MG tablet Take 1 tablet (100 mg total) by mouth daily. 90 tablet 3   metoprolol succinate (TOPROL XL) 25 MG 24 hr tablet Take 1 tablet (25 mg total) by mouth daily. 90 tablet 3   Multiple Vitamins-Minerals (MULTIVITAMIN WITH MINERALS) tablet Take 1 tablet by mouth every evening.     nitroGLYCERIN (NITROSTAT) 0.4 MG SL tablet Place 1 tablet (0.4 mg total) under the tongue every 5 (five) minutes as needed for chest pain. 25 tablet 11   omeprazole (PRILOSEC) 40 MG capsule Take 1 capsule (40 mg total) by mouth in the morning and at bedtime. 180 capsule 3   oxyCODONE-acetaminophen (PERCOCET) 10-325 MG tablet Take 1 tablet by mouth in the morning and at bedtime. Can take one tablet three times a day as needed for back pain     polyethylene glycol (MIRALAX / GLYCOLAX) 17 g packet Take 17 g by mouth daily as needed for moderate constipation.     prasugrel (EFFIENT) 5 MG TABS tablet TAKE 1 TABLET (5 MG TOTAL) BY MOUTH DAILY. 90 tablet 3   PRESCRIPTION MEDICATION Place 2 puffs into both ears daily as needed (ear infections). [facility] ear powder     Simethicone (GAS-X PO) Take 1-2 tablets by mouth daily as needed (gas).     torsemide (DEMADEX) 20 MG tablet Take by mouth.     tretinoin (RETIN-A) 0.1 % cream Apply 1 application. topically every evening.     No  facility-administered medications prior to visit.   Review  of Systems  Review of Systems  Constitutional: Negative.   HENT: Negative.    Respiratory:  Positive for cough.    Physical Exam  BP 128/60 (BP Location: Left Arm, Patient Position: Sitting, Cuff Size: Normal)   Pulse 82   Temp 98.5 F (36.9 C) (Oral)   Ht 5' (1.524 m)   Wt 133 lb (60.3 kg)   SpO2 94% Comment: with 2 lpm oxygen continuous (tank)  BMI 25.97 kg/m  Physical Exam Constitutional:      General: She is not in acute distress.    Appearance: Normal appearance. She is normal weight. She is not ill-appearing or toxic-appearing.  HENT:     Head: Normocephalic and atraumatic.     Mouth/Throat:     Mouth: Mucous membranes are moist.     Pharynx: Oropharynx is clear.  Cardiovascular:     Rate and Rhythm: Normal rate and regular rhythm.  Pulmonary:     Effort: Pulmonary effort is normal. No respiratory distress.     Breath sounds: Rales present. No wheezing or rhonchi.     Comments: Rales at bilateral lower lungs; O2 94% 2L. No respiratory distress  Musculoskeletal:        General: Normal range of motion.     Cervical back: Normal range of motion and neck supple.  Skin:    General: Skin is warm and dry.  Neurological:     General: No focal deficit present.     Mental Status: She is alert and oriented to person, place, and time. Mental status is at baseline.     Comments: Alert and oriented x 3  Psychiatric:        Mood and Affect: Mood normal.        Behavior: Behavior normal.        Thought Content: Thought content normal.        Judgment: Judgment normal.      Lab Results:  CBC    Component Value Date/Time   WBC 10.5 12/22/2022 1110   RBC 4.50 12/22/2022 1110   HGB 14.3 12/22/2022 1110   HGB 15.2 06/29/2022 1538   HCT 41.0 12/22/2022 1110   HCT 45.3 06/29/2022 1538   PLT 233.0 12/22/2022 1110   PLT 272 06/29/2022 1538   MCV 91.1 12/22/2022 1110   MCV 89 06/29/2022 1538   MCH 29.5  07/11/2022 1912   MCHC 34.9 12/22/2022 1110   RDW 14.1 12/22/2022 1110   RDW 12.4 06/29/2022 1538   LYMPHSABS 2.5 12/22/2022 1110   LYMPHSABS 2.0 01/06/2022 0954   MONOABS 0.6 12/22/2022 1110   EOSABS 0.2 12/22/2022 1110   EOSABS 0.2 01/06/2022 0954   BASOSABS 0.1 12/22/2022 1110   BASOSABS 0.0 01/06/2022 0954    BMET    Component Value Date/Time   NA 142 12/22/2022 1110   NA 143 06/29/2022 1538   K 3.8 12/22/2022 1110   CL 104 12/22/2022 1110   CO2 29 12/22/2022 1110   GLUCOSE 103 (H) 12/22/2022 1110   BUN 9 12/22/2022 1110   BUN 7 (L) 06/29/2022 1538   CREATININE 0.69 12/22/2022 1110   CREATININE 0.71 06/29/2016 1057   CALCIUM 9.6 12/22/2022 1110   GFRNONAA >60 07/11/2022 1912   GFRAA 89 01/01/2020 0835    BNP    Component Value Date/Time   BNP 94.2 06/29/2022 1538   BNP 862.1 (H) 01/08/2021 1630    ProBNP No results found for: "PROBNP"  Imaging: DG Chest 2 View  Result Date: 02/17/2023 CLINICAL  DATA:  Cough EXAM: CHEST - 2 VIEW COMPARISON:  CXR 12/01/22 FINDINGS: Status post median sternotomy and CABG. Partially visualized cervical spinal fusion hardware in place. Coronary stent is also visualized. No pleural effusion. No pneumothorax. Normal cardiac and mediastinal contours. Is a hazy retrocardiac opacity, which is nonspecific, but suspicious for infection, image best seen on the lateral radiograph. No radiographically apparent displaced rib fractures. IMPRESSION: Hazy retrocardiac opacity is suspicious for infection. Recommend follow-up chest radiograph in 4-6 weeks to ensure resolution. Electronically Signed   By: Marin Roberts M.D.   On: 02/17/2023 09:49     Assessment & Plan:   Centrilobular emphysema ([place]) - Patient has severe emphysema on CT imaging - New oxygen requirements - Needs updated pulmonary function testing   Acute respiratory failure with hypoxia (HCC) - Due to severe underlying emphysema and pneumonia  - O2 88% with ambulation on room air,  improved to 94% on 2L  - Started on 2L oxygen to use with exertion and at bedtime   COPD with chronic bronchitis (HCC) - Continue Breztri Aerosphere two puffs morning and evening - Use Albuterol 2 puffs every 4-6 hours as needed for breakthrough shortness of breath/wheezing   Pneumonia - Patient developed cough after getting covid booster last week - CXR with hazy retrocardiac opacity is suspicious for infection  - Rx Levaquin '500mg'$  daily x 7 days  - Continue bronchodilators as scheduled and advised patient take mucinex twice daily - Getting basic labs today  - FU in 3-5 days or sooner/ patient declined ED evaluation, she appears in stable condition without respiratory distress for outpatient treatment. Advise if symptoms do not improve or worsen to present to ED   Martyn Ehrich, NP 02/17/2023

## 2023-02-17 NOTE — Progress Notes (Deleted)
Friday she received covid booster shot  Monday she developed chills She checked O2 on Tuesday and her O2 level was low  She reached to her cardiology doctor and he held one of her blood pressure medication  She normally has a cough, she is getting up a lot of mucus with creamy white/yellow sputum She is taking Breztri two puffs morning and evening  She has a little shortness of breath. No wheezing or chest tightness.  No recent PFTs

## 2023-02-18 ENCOUNTER — Encounter: Payer: Self-pay | Admitting: Cardiology

## 2023-02-18 ENCOUNTER — Other Ambulatory Visit: Payer: Self-pay

## 2023-02-18 DIAGNOSIS — R7989 Other specified abnormal findings of blood chemistry: Secondary | ICD-10-CM

## 2023-02-18 DIAGNOSIS — R0989 Other specified symptoms and signs involving the circulatory and respiratory systems: Secondary | ICD-10-CM

## 2023-02-18 NOTE — Telephone Encounter (Addendum)
Called patient.  [employer] delivered her oxygen to her home about 7 pm yesterday.  The order for the nebulizer was placed yesterday at her OV with Beth.  I called patient and she states when [employer] brought her oxygen to her home, she asked the rep about the nebulizer.  Patient said the rep told her there was no order in for a nebulizer.  Verified that order was placed yesterday for nebulizer. Called [employer]. Spoke with Denee in Intake.  She said they had processed the order for the nebulizer, but patient has a copay balance and they have tried to contact the patient today.  She must call [employer] and discuss this payment before they will deliver the nebulizer. Called patient.  Informed that [employer] needed her to call them before they could finalize the order for the nebulizer. Per patients request, I sent a text message from my personal cell phone with [employer] phone number to patients cell phone.  Patient verified she received this.  Patient states nothing further needed.

## 2023-02-18 NOTE — Progress Notes (Signed)
If you or emily could check on patient this morning for me

## 2023-02-18 NOTE — Progress Notes (Signed)
Per Geraldo Pitter, NP: Please call and see how patient is doing. Her d-dimer was elevated and WBC was elevated. If she is not feeling better would ask she go to ED. Otherwise she does need CTA to make sure she does not have PE re: sob/positive d-dimer.    Ordering CTA stat

## 2023-02-18 NOTE — Telephone Encounter (Addendum)
02/18/23  Walk test results put in yesterday (02/17/23) about 4:30pm.  Called patient today.  Patient states she received the oxygen concentrator/tanks about 7 pm yesterday.  She still needs the nebulizer machine.  Will check with PCC's regarding this.  Per [employer], they have tried to contact patient today.  She needs to call [employer] regarding a copay or balance before they will finalize the nebulizer order.  Called patient.  Gave her [employer] phone number and gave all information.  Patient verbalized understanding. **Note:  Per patients request, text with [employer] phone number from my personal cell phone to patients cell phone.

## 2023-02-18 NOTE — Progress Notes (Signed)
Please call and see how patient is doing. Her d-dimer was elevated and WBC was elevated. If she is not feeling better would ask she go to ED. Otherwise she does need CTA to make sure she does not have PE re: sob/positive d-dimer

## 2023-02-18 NOTE — Progress Notes (Signed)
Thanks Amy for update. Needs to be stat just [facility]

## 2023-02-19 ENCOUNTER — Ambulatory Visit (HOSPITAL_BASED_OUTPATIENT_CLINIC_OR_DEPARTMENT_OTHER)
Admission: RE | Admit: 2023-02-19 | Discharge: 2023-02-19 | Disposition: A | Discharge status: Home / Self Care | Payer: Medicare Other | Source: Ambulatory Visit | Attending: Primary Care | Admitting: Primary Care

## 2023-02-19 DIAGNOSIS — R7989 Other specified abnormal findings of blood chemistry: Secondary | ICD-10-CM | POA: Diagnosis not present

## 2023-02-19 DIAGNOSIS — R0989 Other specified symptoms and signs involving the circulatory and respiratory systems: Secondary | ICD-10-CM | POA: Diagnosis not present

## 2023-02-19 DIAGNOSIS — J439 Emphysema, unspecified: Secondary | ICD-10-CM | POA: Diagnosis not present

## 2023-02-19 MED ORDER — IOHEXOL 350 MG/ML SOLN
75.0000 mL | Freq: Once | INTRAVENOUS | Status: AC | PRN
  Administered 2023-02-19: 75 mL via INTRAVENOUS

## 2023-02-19 MED ORDER — IOHEXOL 300 MG/ML  SOLN: 75.0000 mL | Freq: Once | INTRAMUSCULAR | Status: DC | PRN

## 2023-02-20 ENCOUNTER — Ambulatory Visit
Admission: RE | Admit: 2023-02-20 | Discharge: 2023-02-20 | Disposition: A | Discharge status: Home / Self Care | Payer: Medicare Other | Source: Ambulatory Visit | Attending: Diagnostic Neuroimaging | Admitting: Diagnostic Neuroimaging

## 2023-02-20 DIAGNOSIS — H5500 Unspecified nystagmus: Secondary | ICD-10-CM

## 2023-02-20 DIAGNOSIS — H814 Vertigo of central origin: Secondary | ICD-10-CM

## 2023-02-20 MED ORDER — GADOPICLENOL 0.5 MMOL/ML IV SOLN
6.0000 mL | Freq: Once | INTRAVENOUS | Status: AC | PRN
  Administered 2023-02-20: 6 mL via INTRAVENOUS

## 2023-02-21 ENCOUNTER — Ambulatory Visit: Payer: Medicare Other | Admitting: Primary Care

## 2023-02-21 ENCOUNTER — Encounter: Payer: Self-pay | Admitting: Neurology

## 2023-02-21 DIAGNOSIS — J9601 Acute respiratory failure with hypoxia: Secondary | ICD-10-CM | POA: Diagnosis not present

## 2023-02-21 DIAGNOSIS — J4489 Other specified chronic obstructive pulmonary disease: Secondary | ICD-10-CM | POA: Diagnosis not present

## 2023-02-22 NOTE — Progress Notes (Signed)
Can you let patient know her CTA was negative for a blood clot, did show the mild pneumonia. I will see her in office tomorrow.

## 2023-02-22 NOTE — Progress Notes (Unsigned)
@Patient  ID: Megan [name], female    DOB: [birth year], 77 y.o.   MRN: [id number]  No chief complaint on file.   Referring provider: [name], MD  HPI:  77 y.o. whom are seeing in follow-up for evaluation of dyspnea on exertion related to emphysema and likely cardiac causes.  Most recent PCP note reviewed.  Previous [facility] pulmonary encounter:  12/14/22- Dr. Silas Flood  Returns for routine follow-up.  At last visit she felt breast he was giving her some benefit in terms of her breathing.  She still feels this way.  However, she is complaining of bruising or purpuric lesions on extremities.  Suspect is likely due to her dual antiplatelet therapy and thin skin but she is convinced it is related to the steroid.  I informed her she is on an inhaled corticosteroid is unlikely because significant side effects.  However, she would like to try something different to see if it helps with the skin changes.  We reviewed at length her serial CT scans 06/24/2021 and 07/25/2022 shows similar degree of 3 significant emphysema on my review and interpretation,  HPI at initial visit: She was in her usual state of health.  She developed onset of shortness of breath a few weeks ago.  Different than baseline dyspnea.  Recurrent shorter distances.  Preceded with aches, sounds like viral infection.  Still some heart ache as well which is concerning to her given her early CAD and significant coronary history.  This prompted presentation to the ED.  There CTA PE protocol was negative for PE but showed presevere emphysematous changes with predilection for the upper lobes otherwise clear on my review and interpretation.  She subsequent underwent a left heart catheterization showed stable coronary artery disease and prior PCI and CABG.  This was reassuring.  In the interim she was placed on Breztri.  She flexes helped some with her shortness of breath.  Able to do more.  Still get a little bit winded on longer walks etc.   She used to walk a mile and a half a day.  She is working back to getting to that distance.  PMH: Tobacco abuse in remission, hypertension, CAD, asthma, hyperlipidemia, seasonal allergies Surgical history: Back surgery, CABG, PCI Family history: Mother and father with CAD Social history: Former smoker, quit 1997, lives in [place]   02/17/2023 Patient presents today for acute OV due to low oxygen levels. Patient has advanced emphysematous changes of her lungs on CT imaging from August 2023. No recent PFTs. She is maintained on [employer].  Patient received COVID booster 6 days ago. She developed chills two days later along with lower BP reading. She contacted her cardiologist and was instructed to hold one of her blood pressure medications. She has a chronic cough, she is getting up alittle more mucus than normal. Shortness of breath is baseline for the most part. She is not in any distress.   02/23/2023 Patient presents today for 1 week follow-up.  She was seen on March 14 for hypoxemia.  Patient has advanced emphysema.  New oxygen requirements.  Chest x-ray showed possible pneumonia versus atelectasis.  He was started on Levaquin.  CTA was negative for PE.  Allergies  Allergen Reactions   Brilinta [Ticagrelor] Shortness Of Breath   Bextra [Valdecoxib] Other (See Comments)    Increased lft's    Hydrocod Poli-Chlorphe Poli Er Rash   Lipitor [Atorvastatin] Other (See Comments)    Leg weakness    Mevacor [Lovastatin] Other (  See Comments)    Muscle weakness    Plavix [Clopidogrel Bisulfate] Itching and Other (See Comments)    Hands and feet tingle and itch   Zocor [Simvastatin] Other (See Comments)    Bones hurt    Doxycycline Diarrhea and Nausea And Vomiting   Metoprolol Other (See Comments)    Hair loss   Morphine And Related Itching and Nausea Only   Codeine Itching and Rash    Hyper-active   Lisinopril Cough    Immunization History  Administered Date(s) Administered    Covid-19, Mrna,Vaccine(Spikevax)79yrs and older 02/11/2023   Fluad Quad(high Dose 65+) 07/30/2019, 08/10/2022   Influenza, High Dose Seasonal PF 08/13/2017, 08/26/2018, 09/15/2020   Influenza, Seasonal, Injecte, Preservative Fre 09/21/2014, 09/06/2015, 07/21/2016   Influenza,inj,quad, With Preservative 08/06/2017   Influenza-Unspecified 08/10/2017, 10/13/2021   PFIZER(Purple Top)SARS-COV-2 Vaccination 12/22/2019, 01/12/2020, 09/15/2020   PPD Test 09/13/1979, 03/10/2021   Pfizer Covid-19 Vaccine Bivalent Booster 79yrs & up 10/13/2021   Pneumococcal Conjugate-13 12/31/2016   Pneumococcal Polysaccharide-23 12/16/2014, 08/01/2019   Respiratory Syncytial Virus Vaccine,Recomb Aduvanted(Arexvy) 10/06/2022   Td 05/13/2008   Td (Adult), 2 Lf Tetanus Toxid, Preservative Free 05/13/2008   Tdap 08/09/2012   Zoster Recombinat (Shingrix) 01/13/2018, 03/14/2018   Zoster, Live 11/03/2010    Past Medical History:  Diagnosis Date   AAA (abdominal aortic aneurysm) ([place])    a. s/p stent grafting in 2009.   Adenomatous colon polyp 05/2001   Adenomatous duodenal polyp    Allergy    Anginal pain (HCC)    COPD (chronic obstructive pulmonary disease) ([place])    pt denies COPD   Coronary artery disease    a. s/p CABG in 1995;  b. 05/2013 Neg MV, EF 82%;  c. 06/2013 Cath: LM nl, LAD 40-50p, LCX nl, RCA 80p/146m, VG->RCA->PDA->PLSA min irregs, LIMA->LAD atretic, vigorous LV fxn.   Diabetes mellitus without complication (HCC)    history of, resolved. 2017   Diverticulosis    Eczema, dyshidrotic    Eczema, dyshidrotic 1986   Dr. Mathis Fare   Emphysema of lung [facility])    Fatty liver    Fibromyalgia    GERD (gastroesophageal reflux disease)    Hyperlipidemia    Hypertension    Migraine    Myocardial infarction [facility])    Osteoarthritis    Osteoporosis    unsure, having a bone scan soon   Peripheral arterial disease ([place])    left leg diagnosed by Dr Mare Ferrari   Renal artery stenosis [facility])    a. 2008 s/p  PTA    Tobacco History: Social History   Tobacco Use  Smoking Status Former   Packs/day: 0.00   Years: 37.00   Additional pack years: 0.00   Total pack years: 0.00   Types: Cigarettes   Quit date: 12/07/1995   Years since quitting: 27.2   Passive exposure: Never  Smokeless Tobacco Never   Counseling given: Not Answered   Outpatient Medications Prior to Visit  Medication Sig Dispense Refill   Accu-Chek FastClix Lancets MISC Check blood sugar once daily PRN 100 each 1   albuterol (PROAIR HFA) 108 (90 Base) MCG/ACT inhaler Inhale 2 puffs into the lungs every 6 (six) hours as needed for wheezing or shortness of breath. 1 each 11   albuterol (PROVENTIL) (2.5 MG/3ML) 0.083% nebulizer solution Take 3 mLs (2.5 mg total) by nebulization every 6 (six) hours as needed for wheezing or shortness of breath. 360 mL 5   ALPRAZolam (XANAX) 0.25 MG tablet Take 1 tablet (0.25 mg  total) by mouth at bedtime as needed for sleep. (Patient taking differently: Take 0.25 mg by mouth daily as needed for sleep or anxiety.) 90 tablet 3   amLODipine (NORVASC) 5 MG tablet Take 1 tablet (5 mg total) by mouth daily. 90 tablet 3   aspirin EC 81 MG tablet Take 1 tablet (81 mg total) by mouth daily. 90 tablet 3   atorvastatin (LIPITOR) 80 MG tablet TAKE 1 TABLET BY MOUTH EVERY DAY 90 tablet 3   B Complex-Biotin-FA (B-100 COMPLEX PO) Take 1 tablet by mouth every evening.     bismuth subsalicylate (PEPTO BISMOL) 262 MG/15ML suspension Take 30 mLs by mouth every 6 (six) hours as needed for indigestion or diarrhea or loose stools.     Budeson-Glycopyrrol-Formoterol (BREZTRI AEROSPHERE) 160-9-4.8 MCG/ACT AERO Inhale 2 puffs into the lungs in the morning and at bedtime. 1 each 11   cetirizine (ZYRTEC) 10 MG tablet Take 10 mg by mouth daily as needed for allergies.     cholecalciferol (VITAMIN D3) 25 MCG (1000 UNIT) tablet Take 1,000 Units by mouth every evening.     CORDRAN 4 MCG/SQCM TAPE Apply 1 each topically daily as  needed (Rash).      cycloSPORINE (RESTASIS) 0.05 % ophthalmic emulsion Place 1 drop into both eyes 2 (two) times daily.      Ergocalciferol 50 MCG (2000 UT) CAPS Take by mouth.     ezetimibe (ZETIA) 10 MG tablet TAKE 1 TABLET BY MOUTH EVERY DAY 90 tablet 3   gabapentin (NEURONTIN) 300 MG capsule Take 1 capsule (300 mg total) by mouth 3 (three) times daily. (Patient taking differently: Take 300 mg by mouth 2 (two) times daily.) 270 capsule 3   glucose blood (ACCU-CHEK SMARTVIEW) test strip CHECK BLOOD SUGAR ONCE DAILY AS NEEDED 100 strip 1   hydrochlorothiazide (MICROZIDE) 12.5 MG capsule Take 1 capsule (12.5 mg total) by mouth daily. 90 capsule 3   isosorbide mononitrate (IMDUR) 30 MG 24 hr tablet Take 3 tablets ( 90 mg ) every morning and 1 tablet (30 mg ) every evening (Patient taking differently: Take 30-90 mg by mouth See admin instructions. Take 3 tablets ( 90 mg ) every morning and 1 or 2 tablets (30 - 60 mg ) every evening) 360 tablet 3   levofloxacin (LEVAQUIN) 500 MG tablet Take 1 tablet (500 mg total) by mouth daily. 7 tablet 0   losartan (COZAAR) 100 MG tablet Take 1 tablet (100 mg total) by mouth daily. 90 tablet 3   metoprolol succinate (TOPROL XL) 25 MG 24 hr tablet Take 1 tablet (25 mg total) by mouth daily. 90 tablet 3   Multiple Vitamins-Minerals (MULTIVITAMIN WITH MINERALS) tablet Take 1 tablet by mouth every evening.     nitroGLYCERIN (NITROSTAT) 0.4 MG SL tablet Place 1 tablet (0.4 mg total) under the tongue every 5 (five) minutes as needed for chest pain. 25 tablet 11   omeprazole (PRILOSEC) 40 MG capsule Take 1 capsule (40 mg total) by mouth in the morning and at bedtime. 180 capsule 3   oxyCODONE-acetaminophen (PERCOCET) 10-325 MG tablet Take 1 tablet by mouth in the morning and at bedtime. Can take one tablet three times a day as needed for back pain     polyethylene glycol (MIRALAX / GLYCOLAX) 17 g packet Take 17 g by mouth daily as needed for moderate constipation.      prasugrel (EFFIENT) 5 MG TABS tablet TAKE 1 TABLET (5 MG TOTAL) BY MOUTH DAILY. 90 tablet 3  PRESCRIPTION MEDICATION Place 2 puffs into both ears daily as needed (ear infections). [facility] ear powder     Simethicone (GAS-X PO) Take 1-2 tablets by mouth daily as needed (gas).     torsemide (DEMADEX) 20 MG tablet Take by mouth.     tretinoin (RETIN-A) 0.1 % cream Apply 1 application. topically every evening.     No facility-administered medications prior to visit.      Review of Systems  Review of Systems   Physical Exam  There were no vitals taken for this visit. Physical Exam   Lab Results:  CBC    Component Value Date/Time   WBC 15.0 (H) 02/17/2023 1026   RBC 4.66 02/17/2023 1026   HGB 14.4 02/17/2023 1026   HGB 15.2 06/29/2022 1538   HCT 41.7 02/17/2023 1026   HCT 45.3 06/29/2022 1538   PLT 259.0 02/17/2023 1026   PLT 272 06/29/2022 1538   MCV 89.3 02/17/2023 1026   MCV 89 06/29/2022 1538   MCH 29.5 07/11/2022 1912   MCHC 34.5 02/17/2023 1026   RDW 12.8 02/17/2023 1026   RDW 12.4 06/29/2022 1538   LYMPHSABS 1.2 02/17/2023 1026   LYMPHSABS 2.0 01/06/2022 0954   MONOABS 0.9 02/17/2023 1026   EOSABS 0.1 02/17/2023 1026   EOSABS 0.2 01/06/2022 0954   BASOSABS 0.0 02/17/2023 1026   BASOSABS 0.0 01/06/2022 0954    BMET    Component Value Date/Time   NA 138 02/17/2023 1026   NA 143 06/29/2022 1538   K 4.1 02/17/2023 1026   CL 103 02/17/2023 1026   CO2 25 02/17/2023 1026   GLUCOSE 119 (H) 02/17/2023 1026   BUN 19 02/17/2023 1026   BUN 7 (L) 06/29/2022 1538   CREATININE 1.16 02/17/2023 1026   CREATININE 0.71 06/29/2016 1057   CALCIUM 9.9 02/17/2023 1026   GFRNONAA >60 07/11/2022 1912   GFRAA 89 01/01/2020 0835    BNP    Component Value Date/Time   BNP 94.2 06/29/2022 1538   BNP 862.1 (H) 01/08/2021 1630    ProBNP    Component Value Date/Time   PROBNP 145.0 (H) 02/17/2023 1026    Imaging: MR BRAIN/IAC W WO CONTRAST  Result Date:  02/21/2023 Table formatting from the original result was not included. [facility] NEURO[facility] REPORT STUDY DATE: 02/20/23 PATIENT NAME: Megan [name] DOB: [birth year] MRN: [id number] ORDERING CLINICIAN: Penni Bombard, MD CLINICAL HISTORY: 77 y.o. year old female with: 1. Nystagmus  2. Central positional vertigo   EXAM: MR BRAIN/IAC W WO CONTRAST TECHNIQUE: MRI of the brain with and without contrast was obtained utilizing multiplanar, multiecho pulse sequences. CONTRAST: Diagnostic Product Medications (last 72 hours) Showing orders from other encounters  Date/Time Action Medication Dose  02/20/23 1408 Contrast Given  gadopiclenol (VUEWAY) 0.5 MMOL/ML solution 6 mL 6 mL  02/19/23 1459 Contrast Given  iohexol (OMNIPAQUE) 350 MG/ML injection 75 mL 75 mL   COMPARISON: none IMAGING SITE: [facility] AT [place] [address] FINDINGS: No abnormal lesions are seen on diffusion-weighted views to suggest acute ischemia. The cortical sulci, fissures and cisterns are normal in size and appearance. Lateral, third and fourth ventricle are normal in size and appearance.  Incidental small cavum of septum pellucidum and cavum veli interpositi variants noted. No extra-axial fluid collections are seen. No evidence of mass effect or midline shift.  Mild periventricular and subcortical foci of nonspecific T2 hyperintensities.  No abnormal lesions are seen on post  contrast views.  Thin cut views of internal auditory canals, semicircular canals, CN 7/8 complexes and brainstem structures are unremarkable. No abnormal compressive or inflammatory lesions.  On sagittal views the posterior fossa, pituitary gland and corpus callosum are unremarkable.  Left frontal and right frontal chronic cerebral microhemorrhages.  No evidence of intracranial hemorrhage on SWI views. The orbits and their contents, paranasal sinuses and calvarium are notable for  bilateral lens extractions.  Intracranial flow voids are present.   Unremarkable MRI brain (with and without).  No acute findings. INTERPRETING PHYSICIAN: Penni Bombard, MD Certified in Neurology, Neurophysiology and Neuroimaging [facility] [address] [place], [place] [address] [phone]  CT Angio Chest Pulmonary Embolism (PE) W or WO Contrast  Result Date: 02/19/2023 CLINICAL DATA:  Positive D-dimer.  Concern for pulmonary embolism. EXAM: CT ANGIOGRAPHY CHEST WITH CONTRAST TECHNIQUE: Multidetector CT imaging of the chest was performed using the standard protocol during bolus administration of intravenous contrast. Multiplanar CT image reconstructions and MIPs were obtained to evaluate the vascular anatomy. RADIATION DOSE REDUCTION: This exam was performed according to the departmental dose-optimization program which includes automated exposure control, adjustment of the mA and/or kV according to patient size and/or use of iterative reconstruction technique. CONTRAST:  62mL OMNIPAQUE IOHEXOL 350 MG/ML SOLN COMPARISON:  CT chest 07/12/2022 FINDINGS: Cardiovascular: No filling defects within the pulmonary arteries to suggest acute pulmonary embolism. Post CABG. No acute findings Mediastinum/Nodes: No axillary or supraclavicular adenopathy. No mediastinal or hilar adenopathy. No pericardial fluid. Esophagus normal. Lungs/Pleura: No pulmonary infarction. Mild consolidation medial RIGHT lower lobe representing atelectasis or pneumonia (image 66/5) Since of centrilobular emphysema the upper lobes. Upper Abdomen: Limited view of the liver, kidneys, pancreas are unremarkable. Normal adrenal glands. Musculoskeletal: No acute osseous abnormality. Review of the MIP images confirms the above findings. IMPRESSION: 1. No evidence acute pulmonary embolism. 2. Mild consolidation in the medial RIGHT lower lobe representing atelectasis or pneumonia. 3.  Emphysema (ICD10-J43.9).  No acute  osseous abnormality. Electronically Signed   By: Suzy Bouchard M.D.   On: 02/19/2023 16:13   DG Chest 2 View  Result Date: 02/17/2023 CLINICAL DATA:  Cough EXAM: CHEST - 2 VIEW COMPARISON:  CXR 12/01/22 FINDINGS: Status post median sternotomy and CABG. Partially visualized cervical spinal fusion hardware in place. Coronary stent is also visualized. No pleural effusion. No pneumothorax. Normal cardiac and mediastinal contours. Is a hazy retrocardiac opacity, which is nonspecific, but suspicious for infection, image best seen on the lateral radiograph. No radiographically apparent displaced rib fractures. IMPRESSION: Hazy retrocardiac opacity is suspicious for infection. Recommend follow-up chest radiograph in 4-6 weeks to ensure resolution. Electronically Signed   By: Marin Roberts M.D.   On: 02/17/2023 09:49     Assessment & Plan:   No problem-specific Assessment & Plan notes found for this encounter.     Martyn Ehrich, NP 02/22/2023

## 2023-02-23 ENCOUNTER — Ambulatory Visit: Payer: Medicare Other | Admitting: Primary Care

## 2023-02-23 ENCOUNTER — Encounter: Payer: Self-pay | Admitting: Primary Care

## 2023-02-23 ENCOUNTER — Ambulatory Visit (INDEPENDENT_AMBULATORY_CARE_PROVIDER_SITE_OTHER): Payer: Medicare Other

## 2023-02-23 VITALS — BP 122/68 | HR 85 | Wt 130.0 lb

## 2023-02-23 DIAGNOSIS — R531 Weakness: Secondary | ICD-10-CM | POA: Diagnosis not present

## 2023-02-23 DIAGNOSIS — J189 Pneumonia, unspecified organism: Secondary | ICD-10-CM

## 2023-02-23 DIAGNOSIS — J432 Centrilobular emphysema: Secondary | ICD-10-CM | POA: Diagnosis not present

## 2023-02-23 DIAGNOSIS — J9611 Chronic respiratory failure with hypoxia: Secondary | ICD-10-CM

## 2023-02-23 DIAGNOSIS — J4489 Other specified chronic obstructive pulmonary disease: Secondary | ICD-10-CM | POA: Diagnosis not present

## 2023-02-23 NOTE — Assessment & Plan Note (Signed)
-   O2 at rest on Room air 90%; desaturated to 84% RA with ambulation - Continue 2L supplemental oxygen with exertion and at bedtime - Checking ONO to assess for nocturnal needed

## 2023-02-23 NOTE — Assessment & Plan Note (Signed)
-   Clinically improved; Cough is less productive. No acute shortness of breath. Completed course of Levaquin. She continues to have rales at bilateral lobes. Getting repeat CXR today.

## 2023-02-23 NOTE — Assessment & Plan Note (Signed)
-   Former smoker. She has emphysema to upper lungs. Continues to require supplemental oxygen. Getting PFTs to assess diffusion capacity and degree of obstruction.

## 2023-02-23 NOTE — Assessment & Plan Note (Signed)
-   Stable; Continue Breztri Aerosphere two puffs q 12 hours and prn Albuterol hfa/nebulizer every 4-6 hours as needed for breakthrough shortness of breath/wheezing

## 2023-02-23 NOTE — Patient Instructions (Addendum)
Recommendations: Continue Breztri Aerosphere two puffs every 12 hours  Use Albuterol nebulizer every 6 hours as needed for shortness of breath/wheezing  You can stop mucinex, take as needed to loosen congestion  Continue to wear 2L supplemental oxygen with exertion and at bedtime   Orders: Check O2 at rest on room air and walk CXR today (ordered)  Follow-up: Needs PFTs scheduled first available / Keep apt with Dr. Silas Flood in April

## 2023-02-24 NOTE — Telephone Encounter (Signed)
Spoke to patient Dr.Jordan reviewed cxr and chest ct looked like pneumonia.Mildly elevated bnp probably related.Continue same medications.Stated she was feeling better.Follow up appointment scheduled with Dr.Jordan 5/3 at 1:40 pm.

## 2023-02-25 NOTE — Progress Notes (Signed)
Please let patient know there was no evidence of residual pneumonia.  No acute cardiopulmonary disease.  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

## 2023-02-27 ENCOUNTER — Encounter: Payer: Self-pay | Admitting: Cardiology

## 2023-02-28 NOTE — Telephone Encounter (Signed)
Spoke to patient she stated she would like to see Dr.Jordan.Stated she has several questions about her recent diagnosis pneumonia.She wants to make sure she does not have congestive heart failure.She feels ok.Sob no worse.No swelling.Weight stable.Appointment scheduled with Dr.Jordan 4/18 at 2:30 pm.

## 2023-03-03 ENCOUNTER — Ambulatory Visit (INDEPENDENT_AMBULATORY_CARE_PROVIDER_SITE_OTHER): Payer: Medicare Other | Admitting: Pulmonary Disease

## 2023-03-03 DIAGNOSIS — J4489 Other specified chronic obstructive pulmonary disease: Secondary | ICD-10-CM | POA: Diagnosis not present

## 2023-03-03 LAB — PULMONARY FUNCTION TEST
DL/VA % pred: 67 %
DL/VA: 2.85 ml/min/mmHg/L
DLCO cor % pred: 53 %
DLCO cor: 8.81 ml/min/mmHg
DLCO unc % pred: 55 %
DLCO unc: 9.07 ml/min/mmHg
FEF 25-75 Post: 1.07 L/sec
FEF 25-75 Pre: 1.11 L/sec
FEF2575-%Change-Post: -3 %
FEF2575-%Pred-Post: 83 %
FEF2575-%Pred-Pre: 86 %
FEV1-%Change-Post: -1 %
FEV1-%Pred-Post: 84 %
FEV1-%Pred-Pre: 85 %
FEV1-Post: 1.39 L
FEV1-Pre: 1.41 L
FEV1FVC-%Change-Post: 0 %
FEV1FVC-%Pred-Pre: 102 %
FEV6-%Change-Post: 0 %
FEV6-%Pred-Post: 87 %
FEV6-%Pred-Pre: 88 %
FEV6-Post: 1.84 L
FEV6-Pre: 1.85 L
FEV6FVC-%Pred-Post: 106 %
FEV6FVC-%Pred-Pre: 106 %
FVC-%Change-Post: 0 %
FVC-%Pred-Post: 82 %
FVC-%Pred-Pre: 83 %
FVC-Post: 1.84 L
FVC-Pre: 1.85 L
Post FEV1/FVC ratio: 76 %
Post FEV6/FVC ratio: 100 %
Pre FEV1/FVC ratio: 76 %
Pre FEV6/FVC Ratio: 100 %
RV % pred: 92 %
RV: 1.99 L
TLC % pred: 85 %
TLC: 3.82 L

## 2023-03-03 NOTE — Patient Instructions (Signed)
Full PFT Performed Today  

## 2023-03-03 NOTE — Progress Notes (Signed)
Full PFT Performed Today  

## 2023-03-04 NOTE — Telephone Encounter (Signed)
Yes I think I sent a message to Megan [name], it showed normal lung function. Diffusion capacity was moderately reduced due to emphysema and hence why she needs oxygen

## 2023-03-04 NOTE — Progress Notes (Signed)
Please let patient know her lung function appears normal, diffusion capacity is moderately decreased which reflects her known emphysema. I saw her my chart message, Im glad she is staying active. Continue to wear supplemental oxygen with exertion and at bedtime. ONO has been ordered. Keep apt in April with Dr. Lupita Shutter.

## 2023-03-04 NOTE — Telephone Encounter (Signed)
Sherry left message for [employer] regarding scheduling ONO. Pt updated via My Chart message. Pt also wanting to let Beth know PFT had been completed.

## 2023-03-04 NOTE — Addendum Note (Signed)
Addended by: Martyn Ehrich on: 03/04/2023 08:48 AM   Modules accepted: Orders

## 2023-03-09 DIAGNOSIS — H8111 Benign paroxysmal vertigo, right ear: Secondary | ICD-10-CM | POA: Diagnosis not present

## 2023-03-09 DIAGNOSIS — R2689 Other abnormalities of gait and mobility: Secondary | ICD-10-CM | POA: Diagnosis not present

## 2023-03-14 DIAGNOSIS — Z961 Presence of intraocular lens: Secondary | ICD-10-CM | POA: Diagnosis not present

## 2023-03-14 DIAGNOSIS — H04123 Dry eye syndrome of bilateral lacrimal glands: Secondary | ICD-10-CM | POA: Diagnosis not present

## 2023-03-15 ENCOUNTER — Encounter: Payer: Self-pay | Admitting: Pulmonary Disease

## 2023-03-15 ENCOUNTER — Ambulatory Visit: Payer: Medicare Other | Admitting: Pulmonary Disease

## 2023-03-15 VITALS — BP 122/66 | HR 76 | Wt 133.4 lb

## 2023-03-15 DIAGNOSIS — J3489 Other specified disorders of nose and nasal sinuses: Secondary | ICD-10-CM | POA: Diagnosis not present

## 2023-03-15 NOTE — Progress Notes (Signed)
@Patient  ID: Megan [name], female    DOB: 11-11-1946, 77 y.o.   MRN: [id number]  Chief Complaint  Patient presents with   Follow-up    Pt is here for follow up for DOE. Pt states that the Markus Daft is working well for her. Pt had PFTs and CXR at last visit. No new issues noted. Pt is on 2L of pulsed o2 with no changes in breathing.     Referring provider: Willow Ora, MD  HPI:   77 y.o. whom are seeing in follow-up for evaluation of dyspnea on exertion related to emphysema and likely cardiac causes.  Most recent Cardiology  note reviewed.  Seen in pulmonary clinic 02/2023.  Seen twice by Buelah Manis, NP.  Notes reviewed.  Developed onset of chills after COVID vaccination.  Presented to clinic.  Concern for infection on chest x-ray, hazy retrocardiac opacification on my review.  D-dimer is elevated.  Sent for CTA.  Small consolidation right lower lobe seen on CT on my review.  Antibiotics steroids prescribed.  She did improve.  Was not significant short of breath.  Fever chills etc.  Subsequent chest x-ray 02/2023 showed improvement in retrocardiac opacity, no acute cardiopulmonary issue per my review.  Discussed at length oxygen therapy today.  She has many questions about this.  Advised her to keep it 88% or above.  Is dropping below 88% with exertion advised her to wear her oxygen to improve her exercise tolerance and quality of life.  Seems like her oxygen saturation at rest is doing well.  Discussed of her oxygen saturations dropped at rest oxygen therapy will be beneficial in terms of length of life.  Discussed that any desaturation can result in stress on the heart, diastolic function, RV failure etc.  Stressed importance of wearing her oxygen.  She seems to be resistant to wearing it and wants to minimize this.  Okay to minimize but she needs to be checking and wearing if below 88%.  This was stressed.  HPI at initial visit: She was in her usual state of health.  She developed onset of  shortness of breath a few weeks ago.  Different than baseline dyspnea.  Recurrent shorter distances.  Preceded with aches, sounds like viral infection.  Still some heart ache as well which is concerning to her given her early CAD and significant coronary history.  This prompted presentation to the ED.  There CTA PE protocol was negative for PE but showed presevere emphysematous changes with predilection for the upper lobes otherwise clear on my review and interpretation.  She subsequent underwent a left heart catheterization showed stable coronary artery disease and prior PCI and CABG.  This was reassuring.  In the interim she was placed on Breztri.  She flexes helped some with her shortness of breath.  Able to do more.  Still get a little bit winded on longer walks etc.  She used to walk a mile and a half a day.  She is working back to getting to that distance.  PMH: Tobacco abuse in remission, hypertension, CAD, asthma, hyperlipidemia, seasonal allergies Surgical history: Back surgery, CABG, PCI Family history: Mother and father with CAD Social history: Former smoker, quit 1997, lives in [place] / Pulmonary Flowsheets:   ACT:      No data to display          MMRC:     No data to display  Epworth:      No data to display          Tests:   FENO:  No results found for: "NITRICOXIDE"  PFT:    Latest Ref Rng & Units 03/03/2023    1:20 PM  PFT Results  FVC-Pre L 1.85   FVC-Predicted Pre % 83   FVC-Post L 1.84   FVC-Predicted Post % 82   Pre FEV1/FVC % % 76   Post FEV1/FCV % % 76   FEV1-Pre L 1.41   FEV1-Predicted Pre % 85   FEV1-Post L 1.39   DLCO uncorrected ml/min/mmHg 9.07   DLCO UNC% % 55   DLCO corrected ml/min/mmHg 8.81   DLCO COR %Predicted % 53   DLVA Predicted % 67   TLC L 3.82   TLC % Predicted % 85   RV % Predicted % 92     WALK:     02/17/2023    4:46 PM  SIX MIN WALK  Supplimental Oxygen during Test? (L/min) Yes   O2 Flow Rate 2 L/min  Type Continuous  Tech Comments: Patient SaO2 when brought back to exam room was 88% on room air.  Patient was immediately put on oxygen 2 lpm continuous.  After 5 minutes, SaO2 at 93%. Patient was able to walk 1 lap on 2 lpm continuous oxygen (tank) and SaO2 at 94%.  Patient c/o fatigue.  Patient was unable to walk any further laps.  Patient taken back to exam room. and SaO2 at  94% and heart rate 80 bpm.  Patient left with an oxygen tank from [employer] (from closet).  Oxygen set on 2 lpm when patient taken to waiting room.  Patient did require assistance to get the oxygen tank/roller in her car.    Imaging: Personally reviewed and as per EMR discussion in this note DG Chest 2 View  Result Date: 02/25/2023 CLINICAL DATA:  77 year old female with history of right lower lobe pneumonia. EXAM: CHEST - 2 VIEW COMPARISON:  Chest x-ray 02/17/2023. FINDINGS: Lung volumes are normal. No consolidative airspace disease. No pleural effusions. No pneumothorax. No pulmonary nodule or mass noted. Pulmonary vasculature and the cardiomediastinal silhouette are within normal limits. Atherosclerosis in the thoracic aorta. Severe calcifications of the mitral annulus are also noted. Status post median sternotomy for CABG. Orthopedic fixation hardware in the lower lumbar spine incidentally noted. IMPRESSION: 1. No radiographic evidence of acute cardiopulmonary disease. 2. Aortic atherosclerosis. Electronically Signed   By: Trudie Reedaniel  Entrikin M.D.   On: 02/25/2023 10:47   MR BRAIN/IAC W WO CONTRAST  Result Date: 02/21/2023 Table formatting from the original result was not included. [facility] NEUROIM[facility] REPORT STUDY DATE: 02/20/23 PATIENT NAME: Megan [name] DOB: 12-16-1945 MRN: [id number] ORDERING CLINICIAN: Suanne MarkerPenumalli, Vikram R, MD CLINICAL HISTORY: 77 y.o. year old female with: 1. Nystagmus  2. Central positional vertigo   EXAM: MR BRAIN/IAC W WO CONTRAST TECHNIQUE: MRI of the brain  with and without contrast was obtained utilizing multiplanar, multiecho pulse sequences. CONTRAST: Diagnostic Product Medications (last 72 hours) Showing orders from other encounters  Date/Time Action Medication Dose  02/20/23 1408 Contrast Given  gadopiclenol (VUEWAY) 0.5 MMOL/ML solution 6 mL 6 mL  02/19/23 1459 Contrast Given  iohexol (OMNIPAQUE) 350 MG/ML injection 75 mL 75 mL   COMPARISON: none IMAGING SITE: [facility] [address] [place] [id number] FINDINGS: No abnormal lesions are seen on diffusion-weighted views to suggest acute ischemia. The cortical sulci, fissures  and cisterns are normal in size and appearance. Lateral, third and fourth ventricle are normal in size and appearance.  Incidental small cavum of septum pellucidum and cavum veli interpositi variants noted. No extra-axial fluid collections are seen. No evidence of mass effect or midline shift.  Mild periventricular and subcortical foci of nonspecific T2 hyperintensities.  No abnormal lesions are seen on post contrast views.  Thin cut views of internal auditory canals, semicircular canals, CN 7/8 complexes and brainstem structures are unremarkable. No abnormal compressive or inflammatory lesions.  On sagittal views the posterior fossa, pituitary gland and corpus callosum are unremarkable.  Left frontal and right frontal chronic cerebral microhemorrhages.  No evidence of intracranial hemorrhage on SWI views. The orbits and their contents, paranasal sinuses and calvarium are notable for bilateral lens extractions.  Intracranial flow voids are present.   Unremarkable MRI brain (with and without).  No acute findings. INTERPRETING PHYSICIAN: Suanne Marker, MD Certified in Neurology, Neurophysiology and Neuroimaging [facility] [address] [place], [place] [address] [phone]  CT Angio Chest Pulmonary Embolism (PE) W or WO Contrast  Result  Date: 02/19/2023 CLINICAL DATA:  Positive D-dimer.  Concern for pulmonary embolism. EXAM: CT ANGIOGRAPHY CHEST WITH CONTRAST TECHNIQUE: Multidetector CT imaging of the chest was performed using the standard protocol during bolus administration of intravenous contrast. Multiplanar CT image reconstructions and MIPs were obtained to evaluate the vascular anatomy. RADIATION DOSE REDUCTION: This exam was performed according to the departmental dose-optimization program which includes automated exposure control, adjustment of the mA and/or kV according to patient size and/or use of iterative reconstruction technique. CONTRAST:  75mL OMNIPAQUE IOHEXOL 350 MG/ML SOLN COMPARISON:  CT chest 07/12/2022 FINDINGS: Cardiovascular: No filling defects within the pulmonary arteries to suggest acute pulmonary embolism. Post CABG. No acute findings Mediastinum/Nodes: No axillary or supraclavicular adenopathy. No mediastinal or hilar adenopathy. No pericardial fluid. Esophagus normal. Lungs/Pleura: No pulmonary infarction. Mild consolidation medial RIGHT lower lobe representing atelectasis or pneumonia (image 66/5) Since of centrilobular emphysema the upper lobes. Upper Abdomen: Limited view of the liver, kidneys, pancreas are unremarkable. Normal adrenal glands. Musculoskeletal: No acute osseous abnormality. Review of the MIP images confirms the above findings. IMPRESSION: 1. No evidence acute pulmonary embolism. 2. Mild consolidation in the medial RIGHT lower lobe representing atelectasis or pneumonia. 3.  Emphysema (ICD10-J43.9).  No acute osseous abnormality. Electronically Signed   By: Genevive Bi M.D.   On: 02/19/2023 16:13   DG Chest 2 View  Result Date: 02/17/2023 CLINICAL DATA:  Cough EXAM: CHEST - 2 VIEW COMPARISON:  CXR 12/01/22 FINDINGS: Status post median sternotomy and CABG. Partially visualized cervical spinal fusion hardware in place. Coronary stent is also visualized. No pleural effusion. No pneumothorax.  Normal cardiac and mediastinal contours. Is a hazy retrocardiac opacity, which is nonspecific, but suspicious for infection, image best seen on the lateral radiograph. No radiographically apparent displaced rib fractures. IMPRESSION: Hazy retrocardiac opacity is suspicious for infection. Recommend follow-up chest radiograph in 4-6 weeks to ensure resolution. Electronically Signed   By: Lorenza Cambridge M.D.   On: 02/17/2023 09:49    Lab Results: Personally reviewed CBC    Component Value Date/Time   WBC 15.0 (H) 02/17/2023 1026   RBC 4.66 02/17/2023 1026   HGB 14.4 02/17/2023 1026   HGB 15.2 06/29/2022 1538   HCT 41.7 02/17/2023 1026   HCT 45.3 06/29/2022 1538   PLT 259.0 02/17/2023 1026   PLT 272 06/29/2022 1538   MCV 89.3 02/17/2023 1026  MCV 89 06/29/2022 1538   MCH 29.5 07/11/2022 1912   MCHC 34.5 02/17/2023 1026   RDW 12.8 02/17/2023 1026   RDW 12.4 06/29/2022 1538   LYMPHSABS 1.2 02/17/2023 1026   LYMPHSABS 2.0 01/06/2022 0954   MONOABS 0.9 02/17/2023 1026   EOSABS 0.1 02/17/2023 1026   EOSABS 0.2 01/06/2022 0954   BASOSABS 0.0 02/17/2023 1026   BASOSABS 0.0 01/06/2022 0954    BMET    Component Value Date/Time   NA 138 02/17/2023 1026   NA 143 06/29/2022 1538   K 4.1 02/17/2023 1026   CL 103 02/17/2023 1026   CO2 25 02/17/2023 1026   GLUCOSE 119 (H) 02/17/2023 1026   BUN 19 02/17/2023 1026   BUN 7 (L) 06/29/2022 1538   CREATININE 1.16 02/17/2023 1026   CREATININE 0.71 06/29/2016 1057   CALCIUM 9.9 02/17/2023 1026   GFRNONAA >60 07/11/2022 1912   GFRAA 89 01/01/2020 0835    BNP    Component Value Date/Time   BNP 94.2 06/29/2022 1538   BNP 862.1 (H) 01/08/2021 1630    ProBNP    Component Value Date/Time   PROBNP 145.0 (H) 02/17/2023 1026    Specialty Problems       Pulmonary Problems   COPD with chronic bronchitis   Centrilobular emphysema   Chronic respiratory failure   Pneumonia    Allergies  Allergen Reactions   Brilinta [Ticagrelor]  Shortness Of Breath   Bextra [Valdecoxib] Other (See Comments)    Increased lft's    Hydrocod Poli-Chlorphe Poli Er Rash   Lipitor [Atorvastatin] Other (See Comments)    Leg weakness    Mevacor [Lovastatin] Other (See Comments)    Muscle weakness    Plavix [Clopidogrel Bisulfate] Itching and Other (See Comments)    Hands and feet tingle and itch   Zocor [Simvastatin] Other (See Comments)    Bones hurt    Doxycycline Diarrhea and Nausea And Vomiting   Metoprolol Other (See Comments)    Hair loss   Morphine And Related Itching and Nausea Only   Codeine Itching and Rash    Hyper-active   Lisinopril Cough    Immunization History  Administered Date(s) Administered   Covid-19, Mrna,Vaccine(Spikevax)67yrs and older 02/11/2023   Fluad Quad(high Dose 65+) 07/30/2019, 08/10/2022   Influenza, High Dose Seasonal PF 08/13/2017, 08/26/2018, 09/15/2020   Influenza, Seasonal, Injecte, Preservative Fre 09/21/2014, 09/06/2015, 07/21/2016   Influenza,inj,quad, With Preservative 08/06/2017   Influenza-Unspecified 08/10/2017, 10/13/2021   PFIZER(Purple Top)SARS-COV-2 Vaccination 12/22/2019, 01/12/2020, 09/15/2020   PPD Test 09/13/1979, 03/10/2021   Pfizer Covid-19 Vaccine Bivalent Booster 54yrs & up 10/13/2021   Pneumococcal Conjugate-13 12/31/2016   Pneumococcal Polysaccharide-23 12/16/2014, 08/01/2019   Respiratory Syncytial Virus Vaccine,Recomb Aduvanted(Arexvy) 10/06/2022   Td 05/13/2008   Td (Adult), 2 Lf Tetanus Toxid, Preservative Free 05/13/2008   Tdap 08/09/2012   Zoster Recombinat (Shingrix) 01/13/2018, 03/14/2018   Zoster, Live 11/03/2010    Past Medical History:  Diagnosis Date   AAA (abdominal aortic aneurysm)    a. s/p stent grafting in 2009.   Adenomatous colon polyp 05/2001   Adenomatous duodenal polyp    Allergy    Anginal pain    COPD (chronic obstructive pulmonary disease)    pt denies COPD   Coronary artery disease    a. s/p CABG in 1995;  b. 05/2013 Neg MV, EF  82%;  c. 06/2013 Cath: LM nl, LAD 40-50p, LCX nl, RCA 80p/11m, VG->RCA->PDA->PLSA min irregs, LIMA->LAD atretic, vigorous LV fxn.   Diabetes mellitus without  complication    history of, resolved. 2017   Diverticulosis    Eczema, dyshidrotic    Eczema, dyshidrotic 1986   Dr. Gerilyn Nestle   Emphysema of lung    Fatty liver    Fibromyalgia    GERD (gastroesophageal reflux disease)    Hyperlipidemia    Hypertension    Migraine    Myocardial infarction    Osteoarthritis    Osteoporosis    unsure, having a bone scan soon   Peripheral arterial disease    left leg diagnosed by Dr Patty Sermons   Renal artery stenosis    a. 2008 s/p PTA    Tobacco History: Social History   Tobacco Use  Smoking Status Former   Packs/day: 0.00   Years: 37.00   Additional pack years: 0.00   Total pack years: 0.00   Types: Cigarettes   Quit date: 12/07/1995   Years since quitting: 27.2   Passive exposure: Never  Smokeless Tobacco Never   Counseling given: Not Answered   Continue to not smoke  Outpatient Encounter Medications as of 03/15/2023  Medication Sig   Accu-Chek FastClix Lancets MISC Check blood sugar once daily PRN   albuterol (PROAIR HFA) 108 (90 Base) MCG/ACT inhaler Inhale 2 puffs into the lungs every 6 (six) hours as needed for wheezing or shortness of breath.   albuterol (PROVENTIL) (2.5 MG/3ML) 0.083% nebulizer solution Take 3 mLs (2.5 mg total) by nebulization every 6 (six) hours as needed for wheezing or shortness of breath.   ALPRAZolam (XANAX) 0.25 MG tablet Take 1 tablet (0.25 mg total) by mouth at bedtime as needed for sleep. (Patient taking differently: Take 0.25 mg by mouth daily as needed for sleep or anxiety.)   amLODipine (NORVASC) 5 MG tablet Take 1 tablet (5 mg total) by mouth daily.   aspirin EC 81 MG tablet Take 1 tablet (81 mg total) by mouth daily.   atorvastatin (LIPITOR) 80 MG tablet TAKE 1 TABLET BY MOUTH EVERY DAY   B Complex-Biotin-FA (B-100 COMPLEX PO) Take 1 tablet  by mouth every evening.   bismuth subsalicylate (PEPTO BISMOL) 262 MG/15ML suspension Take 30 mLs by mouth every 6 (six) hours as needed for indigestion or diarrhea or loose stools.   Budeson-Glycopyrrol-Formoterol (BREZTRI AEROSPHERE) 160-9-4.8 MCG/ACT AERO Inhale 2 puffs into the lungs in the morning and at bedtime.   cetirizine (ZYRTEC) 10 MG tablet Take 10 mg by mouth daily as needed for allergies.   cholecalciferol (VITAMIN D3) 25 MCG (1000 UNIT) tablet Take 1,000 Units by mouth every evening.   CORDRAN 4 MCG/SQCM TAPE Apply 1 each topically daily as needed (Rash).    cycloSPORINE (RESTASIS) 0.05 % ophthalmic emulsion Place 1 drop into both eyes 2 (two) times daily.    Ergocalciferol 50 MCG (2000 UT) CAPS Take by mouth.   ezetimibe (ZETIA) 10 MG tablet TAKE 1 TABLET BY MOUTH EVERY DAY   gabapentin (NEURONTIN) 300 MG capsule Take 1 capsule (300 mg total) by mouth 3 (three) times daily. (Patient taking differently: Take 300 mg by mouth 2 (two) times daily.)   glucose blood (ACCU-CHEK SMARTVIEW) test strip CHECK BLOOD SUGAR ONCE DAILY AS NEEDED   hydrochlorothiazide (MICROZIDE) 12.5 MG capsule Take 1 capsule (12.5 mg total) by mouth daily.   isosorbide mononitrate (IMDUR) 30 MG 24 hr tablet Take 3 tablets ( 90 mg ) every morning and 1 tablet (30 mg ) every evening (Patient taking differently: Take 30-90 mg by mouth See admin instructions. Take 3 tablets ( 90  mg ) every morning and 1 or 2 tablets (30 - 60 mg ) every evening)   losartan (COZAAR) 100 MG tablet Take 1 tablet (100 mg total) by mouth daily.   metoprolol succinate (TOPROL XL) 25 MG 24 hr tablet Take 1 tablet (25 mg total) by mouth daily.   Multiple Vitamins-Minerals (MULTIVITAMIN WITH MINERALS) tablet Take 1 tablet by mouth every evening.   nitroGLYCERIN (NITROSTAT) 0.4 MG SL tablet Place 1 tablet (0.4 mg total) under the tongue every 5 (five) minutes as needed for chest pain.   omeprazole (PRILOSEC) 40 MG capsule Take 1 capsule (40 mg  total) by mouth in the morning and at bedtime.   oxyCODONE-acetaminophen (PERCOCET) 10-325 MG tablet Take 1 tablet by mouth in the morning and at bedtime. Can take one tablet three times a day as needed for back pain   polyethylene glycol (MIRALAX / GLYCOLAX) 17 g packet Take 17 g by mouth daily as needed for moderate constipation.   prasugrel (EFFIENT) 5 MG TABS tablet TAKE 1 TABLET (5 MG TOTAL) BY MOUTH DAILY.   PRESCRIPTION MEDICATION Place 2 puffs into both ears daily as needed (ear infections). [facility]SA[facility] ear powder   Simethicone (GAS-X PO) Take 1-2 tablets by mouth daily as needed (gas).   torsemide (DEMADEX) 20 MG tablet Take by mouth.   tretinoin (RETIN-A) 0.1 % cream Apply 1 application. topically every evening.   [DISCONTINUED] levofloxacin (LEVAQUIN) 500 MG tablet Take 1 tablet (500 mg total) by mouth daily.   No facility-administered encounter medications on file as of 03/15/2023.     Review of Systems  Review of Systems  N/a Physical Exam  BP 122/66 (BP Location: Left Arm, Patient Position: Sitting, Cuff Size: Normal)   Pulse 76   Wt 133 lb 6.4 oz (60.5 kg)   SpO2 94% Comment: 2L pulsed via POC  BMI 26.05 kg/m   Wt Readings from Last 5 Encounters:  03/15/23 133 lb 6.4 oz (60.5 kg)  02/23/23 130 lb (59 kg)  02/17/23 133 lb (60.3 kg)  01/31/23 136 lb 6.4 oz (61.9 kg)  01/31/23 136 lb 3.2 oz (61.8 kg)    BMI Readings from Last 5 Encounters:  03/15/23 26.05 kg/m  02/23/23 25.39 kg/m  02/17/23 25.97 kg/m  01/31/23 27.55 kg/m  01/31/23 27.51 kg/m     Physical Exam General: Well-appearing, sitting in chair Eyes: EOMI, no icterus Neck: Supple, no JVP Pulmonary: Clear, normal work of breathing Cardiovascular warm, no edema Abdomen: Nondistended, bowel sounds present MSK: No synovitis, no joint effusion Neuro: Normal gait, no weakness Psych: Normal mood, full affect   Assessment & Plan:   Dyspnea on exertion: Likely multifactorial related to cardiac issues,  worsening diastology with exercise given significant left atrial dilation.  Also suspect large contribution from emphysema.  Severe emphysema on imaging.  Continue Breztri.  Continue cardiology follow-up.  PFTs normal with exception of decreased DLCO due to emphysema.  Pneumonia: Seen on chest x-ray and CT 02/2023.  Subsequent improvement on chest x-ray later in the month 02/2023.  No further follow-up needed.  Chronic hypoxemic respite failure: Due to significant emphysematous changes on CT scan.  Stressed importance of keeping oxygen saturation 88% or above at rest and with exertion.  Return in about 6 months (around 09/14/2023) for f/u Dr. [name].   Karren Burly, MD 03/15/2023  I spent 40 minutes in the care of the patient including face-to-face visit, coordination of care, review of records.

## 2023-03-15 NOTE — Patient Instructions (Signed)
Nice to see you again  No changes to medications  Continue oxygen, check your oxygen saturation, if it is dropping below 88% please wear the oxygen  Continue being active, continue to walk etc.  We will order humidification for your home concentrator  Will look into the overnight oximetry test, oftentimes these get delayed for weeks due to lack of equipment but we will look into it with the company who was going to supply the equipment.  Return to clinic in 6 months or sooner as needed with Dr. [name]

## 2023-03-20 DIAGNOSIS — R531 Weakness: Secondary | ICD-10-CM | POA: Diagnosis not present

## 2023-03-21 ENCOUNTER — Other Ambulatory Visit: Payer: Self-pay | Admitting: General Practice

## 2023-03-21 DIAGNOSIS — H903 Sensorineural hearing loss, bilateral: Secondary | ICD-10-CM | POA: Diagnosis not present

## 2023-03-21 DIAGNOSIS — H8111 Benign paroxysmal vertigo, right ear: Secondary | ICD-10-CM | POA: Diagnosis not present

## 2023-03-22 NOTE — Progress Notes (Unsigned)
Cardiology Office Note   Date:  03/24/2023   ID:  Megan [name], DOB [birth year], MRN [id number]  PCP:  Willow Ora, MD  Cardiologist: Anniemae Haberkorn [place] MD  Chief Complaint  Patient presents with   Shortness of Breath    History of Present Illness: Megan [name] is a 77 y.o. female who is seen for follow up CAD.  She has a history  of CAD, HTN, HL, and PAD.   She is status post coronary artery bypass graft surgery in 1995- Dr Tyrone Sage. She had a renal artery stent for renal artery stenosis in 2008. She had a stent graft of an abdominal aortic aneurysm 2009. She is s/p stenting of the left iliac. She is followed by Dr. Arbie Cookey for PAD. Last CT in May 2018. She had [place] in June 2019 showing no endoleak.    She was hospitalized in July 2014 and had cardiac catheterization which showed that her grafts were open. EF normal.  She was hospitalized 11/15/2014 with which he describes as an aching sensation in her chest.    She underwent a treadmill Myoview on 11/17/14 which showed no ischemia and her ejection fraction was 86%.   In June 2019 she had Abdominal [facility] at [facility] showing AAA of 4 cm.   In October 2019 she underwent right rotator cuff repair. No complications.   She was  having progressive issues with her low back that limited her activity and caused pain. She has exhausted conservative measures and underwent lumbar fusion in July by Dr Yetta Barre.   She was in the hospital  1/31 and discharged on 2/8- for AKI, diarrhea- diverticulitis (sigmoid colon), metabolic encephalopathy, UTI. Echo at that time was OK with normal EF and moderate TR.   She was re- admitted 2/26-02/03/21 with NSTEMI. Troponin up to 1085. Ecg showed significant inferolateral TWA. She underwent cardiac cath demonstrating patent LIMA to the LAD. The native RCA was occluded and she had severe sequential lesions in the SVG to RCA. This was treated with overlapping DES x 2- 3.0 x 26 and 3.5 x 34 mm Onyx stents.  Procedure was  complicated by a left forearm hematoma. She was treated with DAPT with ASA and Brilinta. On metoprolol and high dose statin.   She was admitted for overnight observation on 02/11/21 after presenting with a localized ache in her left chest. This was different than when she presented with her NSTEMI. Ecg showed no new changes and troponin was negative x 3  On prior visit she complained of increased SOB- this was felt to be related to Brilinta. We switched to Plavix but she had an allergic reaction with rash and hives. Later switched to Prasugrel- reduced dose based on age and female.    She presented to the ED on 7/21 with complaints of chest pain over the past 2 days. Developed an aching sensation in the left side of her chest on Tuesday afternoon. Tried a SL nitro without relief. Also tried several reflux medications without much either. Symptoms lingered into the night and next morning she woke up with ongoing symptoms.  She underwent repeat cardiac cath demonstrating restenosis of the SVG to RCA with clear evidence of prior stent fracture. She underwent repeat PCI with 3.5 x 26 mm Onyx stent post dilated to 4.0 with Golf balloon using IVUS guidance. Of note there were no collaterals to the distal RCA. There was significant aortic calcification. On July 28 she complained of some recurrent chest pain and  her Imdur was increased. When seen in office on August 17 she was better. A sleep study ordered due to daytime somnolence.   She was seen in the ED on 08/27/21 with recurrent chest pain. Ecg showed no changes and troponins were negative. Reports pain the same as before. In discussion with cardiology fellow she was DC from ED with increased dose of Imdur to 90 mg daily and metoprolol to 37.5 mg bid. She was concerned so she underwent repeat cardiac cath which showed the stent was patent.   Was seen by Gillian Shields PA on Jully 25.  Noted symptoms of dyspnea. was seen by her PCP. Noted her BP was a little high  and Losartan dose was increased to 100mg  daily. The next day started having cold chills and shaking. Felt she was coming down with a cold. Noted body aches, temperature was 61 F which is much higher than her baseline. Cough with sputum production. COVID tests x3 were negative. Later developed  sporadic shortness of breath associated with chest aching. She continued to have symptoms of atypical chest pain and some dyspnea so we proceeded with repeat cardiac cath in August 2023. This showed patent grafts and no new disease.  She has had progressive SOB. Seen by pulmonary and now on home oxygen. D dimer elevated but CT showed no PE. +emphysema. No cough. Minor swelling. Pro BNP mildly elevated 145. CXR without edema.   She is not currently on diuretics. Notes her brother just passed away with an MI.      Past Medical History:  Diagnosis Date   AAA (abdominal aortic aneurysm)    a. s/p stent grafting in 2009.   Adenomatous colon polyp 05/2001   Adenomatous duodenal polyp    Allergy    Anginal pain    COPD (chronic obstructive pulmonary disease)    pt denies COPD   Coronary artery disease    a. s/p CABG in 1995;  b. 05/2013 Neg MV, EF 82%;  c. 06/2013 Cath: LM nl, LAD 40-50p, LCX nl, RCA 80p/176m, VG->RCA->PDA->PLSA min irregs, LIMA->LAD atretic, vigorous LV fxn.   Diabetes mellitus without complication    history of, resolved. 2017   Diverticulosis    Eczema, dyshidrotic    Eczema, dyshidrotic 1986   Dr. Gerilyn Nestle   Emphysema of lung    Fatty liver    Fibromyalgia    GERD (gastroesophageal reflux disease)    Hyperlipidemia    Hypertension    Migraine    Myocardial infarction    Osteoarthritis    Osteoporosis    unsure, having a bone scan soon   Peripheral arterial disease    left leg diagnosed by Dr Patty Sermons   Renal artery stenosis    a. 2008 s/p PTA    Past Surgical History:  Procedure Laterality Date   ABDOMINAL AORTAGRAM N/A 03/06/2014   Procedure: ABDOMINAL AORTAGRAM;   Surgeon: Larina Earthly, MD;  Location: [facility] CATH LAB;  Service: Cardiovascular;  Laterality: N/A;   ABDOMINAL AORTIC ANEURYSM REPAIR  2009   stenting   ABDOMINAL HYSTERECTOMY     Total, 1992   BREAST BIOPSY     CARDIAC CATHETERIZATION     CARDIOVASCULAR STRESS TEST  11/11/2009   normal study   CARPAL TUNNEL RELEASE  08/1993   left wrist   CARPAL TUNNEL RELEASE  01/1997   right   CATARACT EXTRACTION, BILATERAL  2021   CORONARY ARTERY BYPASS GRAFT  07/1994   Triple by Dr Ofilia Neas  CORONARY STENT INTERVENTION N/A 02/02/2021   Procedure: CORONARY STENT INTERVENTION;  Surgeon: Yvonne Kendall, MD;  Location: [facility] INVASIVE CV LAB;  Service: Cardiovascular;  Laterality: N/A;   CORONARY STENT INTERVENTION N/A 06/26/2021   Procedure: CORONARY STENT INTERVENTION;  Surgeon: Tonny Bollman, MD;  Location: [facility] INVASIVE CV LAB;  Service: Cardiovascular;  Laterality: N/A;   CORONARY ULTRASOUND/IVUS N/A 06/26/2021   Procedure: Intravascular Ultrasound/IVUS;  Surgeon: Tonny Bollman, MD;  Location: [facility] INVASIVE CV LAB;  Service: Cardiovascular;  Laterality: N/A;   CORONARY ULTRASOUND/IVUS N/A 09/03/2021   Procedure: Intravascular Ultrasound/IVUS;  Surgeon: Orbie Pyo, MD;  Location: [facility] INVASIVE CV LAB;  Service: Cardiovascular;  Laterality: N/A;   dental implants     EYE SURGERY  03/2020   cataract   HAND SURGERY Right 09/2002   Right hand pulley release   LAMINECTOMY WITH POSTERIOR LATERAL ARTHRODESIS LEVEL 1 N/A 03/24/2022   Procedure: LAMINECTOMY, FORAMINOTOMY, LUMBAR TWO- THREE ; INTERTRANSVERSE FUSION LUMBAR TWO - LUMBAR THREE RIGHT;  Surgeon: Tia Alert, MD;  Location: [facility] OR;  Service: Neurosurgery;  Laterality: N/A;   LEFT HEART CATH AND CORONARY ANGIOGRAPHY N/A 06/26/2021   Procedure: LEFT HEART CATH AND CORONARY ANGIOGRAPHY;  Surgeon: Tonny Bollman, MD;  Location: [facility] INVASIVE CV LAB;  Service: Cardiovascular;  Laterality: N/A;   LEFT HEART CATH AND CORS/GRAFTS ANGIOGRAPHY N/A 02/02/2021    Procedure: LEFT HEART CATH AND CORS/GRAFTS ANGIOGRAPHY;  Surgeon: Yvonne Kendall, MD;  Location: [facility] INVASIVE CV LAB;  Service: Cardiovascular;  Laterality: N/A;   LEFT HEART CATH AND CORS/GRAFTS ANGIOGRAPHY N/A 09/03/2021   Procedure: LEFT HEART CATH AND CORS/GRAFTS ANGIOGRAPHY;  Surgeon: Orbie Pyo, MD;  Location: [facility] INVASIVE CV LAB;  Service: Cardiovascular;  Laterality: N/A;   LEFT HEART CATH AND CORS/GRAFTS ANGIOGRAPHY N/A 07/15/2022   Procedure: LEFT HEART CATH AND CORS/GRAFTS ANGIOGRAPHY;  Surgeon: [place], Brenn Deziel M, MD;  Location: [facility] INVASIVE CV LAB;  Service: Cardiovascular;  Laterality: N/A;   LEFT HEART CATHETERIZATION WITH CORONARY/GRAFT ANGIOGRAM N/A 06/11/2013   Procedure: LEFT HEART CATHETERIZATION WITH Isabel Caprice;  Surgeon: Vesta Mixer, MD;  Location: [facility] CATH LAB;  Service: Cardiovascular;  Laterality: N/A;   MULTIPLE TOOTH EXTRACTIONS  2000   All upper teeth.  Has full dneture.    RENAL ARTERY STENT  2008   SHOULDER ARTHROSCOPY WITH ROTATOR CUFF REPAIR Right 09/27/2018   Procedure: Right shoulder mini open rotator cuff repair;  Surgeon: Jene Every, MD;  Location: [facility] ORS;  Service: Orthopedics;  Laterality: Right;  90 mins   thyroid cyst apiration  05/1997 and 07/1998   TONSILECTOMY, ADENOIDECTOMY, BILATERAL MYRINGOTOMY AND TUBES  1989   TONSILLECTOMY     TOTAL ABDOMINAL HYSTERECTOMY  1992     Current Outpatient Medications  Medication Sig Dispense Refill   Accu-Chek FastClix Lancets MISC Check blood sugar once daily PRN 100 each 1   albuterol (PROAIR HFA) 108 (90 Base) MCG/ACT inhaler Inhale 2 puffs into the lungs every 6 (six) hours as needed for wheezing or shortness of breath. 1 each 11   albuterol (PROVENTIL) (2.5 MG/3ML) 0.083% nebulizer solution Take 3 mLs (2.5 mg total) by nebulization every 6 (six) hours as needed for wheezing or shortness of breath. 360 mL 5   ALPRAZolam (XANAX) 0.25 MG tablet Take 1 tablet (0.25 mg total) by mouth at bedtime as  needed for sleep. 90 tablet 3   amLODipine (NORVASC) 5 MG tablet Take 1 tablet (5 mg total) by mouth daily. 90 tablet 3   aspirin EC  81 MG tablet Take 1 tablet (81 mg total) by mouth daily. 90 tablet 3   atorvastatin (LIPITOR) 80 MG tablet TAKE 1 TABLET BY MOUTH EVERY DAY 90 tablet 3   B Complex-Biotin-FA (B-100 COMPLEX PO) Take 1 tablet by mouth every evening.     bismuth subsalicylate (PEPTO BISMOL) 262 MG/15ML suspension Take 30 mLs by mouth every 6 (six) hours as needed for indigestion or diarrhea or loose stools.     Budeson-Glycopyrrol-Formoterol (BREZTRI AEROSPHERE) 160-9-4.8 MCG/ACT AERO Inhale 2 puffs into the lungs in the morning and at bedtime. 1 each 11   cetirizine (ZYRTEC) 10 MG tablet Take 10 mg by mouth daily as needed for allergies.     cholecalciferol (VITAMIN D3) 25 MCG (1000 UNIT) tablet Take 1,000 Units by mouth every evening.     CORDRAN 4 MCG/SQCM TAPE Apply 1 each topically daily as needed (Rash).      cycloSPORINE (RESTASIS) 0.05 % ophthalmic emulsion Place 1 drop into both eyes 2 (two) times daily.      Ergocalciferol 50 MCG (2000 UT) CAPS Take by mouth.     ezetimibe (ZETIA) 10 MG tablet TAKE 1 TABLET BY MOUTH EVERY DAY 90 tablet 3   gabapentin (NEURONTIN) 300 MG capsule Take 1 capsule (300 mg total) by mouth 3 (three) times daily. (Patient taking differently: Take 300 mg by mouth 2 (two) times daily.) 270 capsule 3   glucose blood (ACCU-CHEK SMARTVIEW) test strip CHECK BLOOD SUGAR ONCE DAILY AS NEEDED 100 strip 1   hydrochlorothiazide (HYDRODIURIL) 12.5 MG tablet TAKE 1 TABLET BY MOUTH EVERY DAY 90 tablet 3   isosorbide mononitrate (IMDUR) 30 MG 24 hr tablet Take 3 tablets ( 90 mg ) every morning and 1 tablet (30 mg ) every evening (Patient taking differently: Take 30-90 mg by mouth See admin instructions. Take 3 tablets ( 90 mg ) every morning and 1 or 2 tablets (30 - 60 mg ) every evening) 360 tablet 3   losartan (COZAAR) 100 MG tablet Take 1 tablet (100 mg total) by  mouth daily. 90 tablet 3   metoprolol succinate (TOPROL XL) 25 MG 24 hr tablet Take 1 tablet (25 mg total) by mouth daily. 90 tablet 3   Multiple Vitamins-Minerals (MULTIVITAMIN WITH MINERALS) tablet Take 1 tablet by mouth every evening.     nitroGLYCERIN (NITROSTAT) 0.4 MG SL tablet Place 1 tablet (0.4 mg total) under the tongue every 5 (five) minutes as needed for chest pain. 25 tablet 11   omeprazole (PRILOSEC) 40 MG capsule Take 1 capsule (40 mg total) by mouth in the morning and at bedtime. 180 capsule 3   oxyCODONE-acetaminophen (PERCOCET) 10-325 MG tablet Take 1 tablet by mouth in the morning and at bedtime. Can take one tablet three times a day as needed for back pain     polyethylene glycol (MIRALAX / GLYCOLAX) 17 g packet Take 17 g by mouth daily as needed for moderate constipation.     prasugrel (EFFIENT) 5 MG TABS tablet TAKE 1 TABLET (5 MG TOTAL) BY MOUTH DAILY. 90 tablet 3   PRESCRIPTION MEDICATION Place 2 puffs into both ears daily as needed (ear infections). [facility] ear powder     Simethicone (GAS-X PO) Take 1-2 tablets by mouth daily as needed (gas).     torsemide (DEMADEX) 20 MG tablet Take by mouth.     tretinoin (RETIN-A) 0.1 % cream Apply 1 application. topically every evening.     No current facility-administered medications for this visit.  Allergies:   Brilinta [ticagrelor], Bextra [valdecoxib], Hydrocod poli-chlorphe poli er, Lipitor [atorvastatin], Mevacor [lovastatin], Plavix [clopidogrel bisulfate], Zocor [simvastatin], Doxycycline, Metoprolol, Morphine and related, Codeine, and Lisinopril    Social History:  The patient  reports that she quit smoking about 27 years ago. Her smoking use included cigarettes. She has never been exposed to tobacco smoke. She has never used smokeless tobacco. She reports current alcohol use of about 6.0 - 10.0 standard drinks of alcohol per week. She reports that she does not use drugs.   Family History:  The patient's family history  includes AAA (abdominal aortic aneurysm) in her brother; Breast cancer in her sister; Colon cancer in her cousin and paternal grandmother; Diabetes in her brother and sister; Heart attack in her brother, father, mother, and sister; Heart disease in her brother, brother, father, mother, and sister; Hyperlipidemia in her brother and sister; Hypertension in her brother and sister; Liver cancer in her sister.    ROS:  Please see the history of present illness.   Otherwise, review of systems are positive for none.   All other systems are reviewed and negative.    PHYSICAL EXAM: VS:  BP 124/62   Pulse 84   Ht 5' (1.524 m)   Wt 131 lb 8 oz (59.6 kg)   SpO2 92%   BMI 25.68 kg/m  , BMI Body mass index is 25.68 kg/m. GENERAL:  Overweight  WF in NAD HEENT:  PERRL, EOMI, sclera are clear. Oropharynx is clear. NECK:  No jugular venous distention, carotid upstroke brisk and symmetric, no bruits, no thyromegaly or adenopathy LUNGS:  Clear to auscultation bilaterally CHEST:  Unremarkable HEART:  RRR,  PMI not displaced or sustained,S1 and S2 within normal limits, no S3, no S4: no clicks, no rubs, no murmurs ABD:  Soft, nontender. BS +, no masses or bruits. No hepatomegaly, no splenomegaly EXT:  1 + pulses throughout, tr edema, no cyanosis no clubbing SKIN:  Warm and dry.  No rashes NEURO:  Alert and oriented x 3. Cranial nerves II through XII intact. PSYCH:  Cognitively intact     EKG:  EKG is not done today.    Recent Labs: 06/29/2022: BNP 94.2; Magnesium 1.9 12/22/2022: ALT 22 12/23/2022: TSH 1.86 02/17/2023: BUN 19; Creatinine, Ser 1.16; Hemoglobin 14.4; Platelets 259.0; Potassium 4.1; Pro B Natriuretic peptide (BNP) 145.0; Sodium 138   Labs from primary care 12/15/16: A1c 5.6%. : Cholesterol 167 Trig-209, HDL 52, LDL 73.  AST 51, ALT 65. Normal renal indices. TSH is normal. Dated 06/24/17: CRP <0.3. Glucose 120, AST 58, ALT 80, other chemistries normal.  Dated 09/28/18: normal CBC,  CMET  Lipid Panel    Component Value Date/Time   CHOL 148 12/22/2022 1110   CHOL 142 04/05/2022 1005   TRIG 130.0 12/22/2022 1110   HDL 63.60 12/22/2022 1110   HDL 51 04/05/2022 1005   CHOLHDL 2 12/22/2022 1110   VLDL 26.0 12/22/2022 1110   LDLCALC 58 12/22/2022 1110   LDLCALC 57 04/05/2022 1005   LDLDIRECT 82.0 06/13/2020 1017      Wt Readings from Last 3 Encounters:  03/24/23 131 lb 8 oz (59.6 kg)  03/15/23 133 lb 6.4 oz (60.5 kg)  02/23/23 130 lb (59 kg)    Cardiac cath / PCI 02/02/21: CORONARY STENT INTERVENTION  LEFT HEART CATH AND CORS/GRAFTS ANGIOGRAPHY    Conclusion  Conclusions: Severe single-vessel coronary artery disease with chronic total occlusion of mid RCA. Mild to moderate, diffuse LAD disease with competitive flow in  the distal vessel from LIMA-LAD. Small but patent LIMA-LAD. Patent SVG-rPDA-rPL with diffuse ostial/proximal disease of up to 60% and tandem hazy 95% and 90% mid graft lesions. Successful PCI to ostial/proximal and mid SVG-rPDA-rPL using non-overlapping Resolute Onyx 3.0 x 26 mm and 3.5 x 34 mm drug-eluting stents with 0% residual stenosis and TIMI-3 flow. Left forearm hematoma following sheath removal ant TR band placement, controlled with manual compression and placement of second TR band proximal to the first band.   Recommendations: Dual antiplatelet therapy with aspirin and ticagrelor for at least 12 months. Aggressive secondary prevention. Close monitoring of left forearm hematoma.   Yvonne Kendall, MD [facility]    Coronary Diagrams   Diagnostic Dominance: Right    Intervention     Implants       Echo 01/31/21: IMPRESSIONS     1. Left ventricular ejection fraction, by estimation, is 60 to 65%. The  left ventricle has normal function. The left ventricle has no regional  wall motion abnormalities. Left ventricular diastolic parameters are  consistent with Grade I diastolic  dysfunction (impaired relaxation).    2. Right ventricular systolic function is normal. The right ventricular  size is normal. There is normal pulmonary artery systolic pressure.   3. Left atrial size was severely dilated.   4. The mitral valve is grossly normal. Mild mitral valve regurgitation:  Apperance of two jets; study may underestimated regurgitation severity.   5. The aortic valve is tricuspid. Aortic valve regurgitation is not  visualized. No aortic stenosis is present.   6. The inferior vena cava is normal in size with greater than 50%  respiratory variability, suggesting right atrial pressure of 3 mmHg.   Comparison(s): A prior study was performed on 01/09/21. No significant  change from prior study. Prior images reviewed side by side. Similar to  prior.    Cardiac cath 06/26/21: Procedures  CORONARY STENT INTERVENTION  Intravascular Ultrasound/IVUS  LEFT HEART CATH AND CORONARY ANGIOGRAPHY   Conclusion      Dist LM lesion is 15% stenosed.   Prox RCA lesion is 75% stenosed.   Mid RCA to Dist RCA lesion is 100% stenosed with 100% stenosed side branch in RPAV.   Origin to Prox Graft lesion before RPDA  is 80% stenosed.   Non-stenotic Mid Graft-1 lesion before RPDA was previously treated.   Non-stenotic Mid Graft-2 lesion before RPDA was previously treated.   A drug-eluting stent was successfully placed using a STENT ONYX FRONTIER 3.5X26.   Post intervention, there is a 20% residual stenosis.   Seq SVG- and is large.   LIMA and is small.   There is competitive flow.   1.  Stable native vessel coronary artery disease with chronic total occlusion of the proximal to mid RCA, patency of the left main, diffuse mild to moderate calcific nonobstructive LAD stenosis, and moderate nonobstructive proximal left circumflex stenosis 2.  Continued patency of the LIMA to LAD 3.  Continued patency of the sequential saphenous vein graft to PDA and posterolateral branches with severe in-stent restenosis in the proximal stent,  both lesions likely related to stent fracture 4.  Successful intravascular ultrasound-guided PCI of the saphenous vein graft to RCA using a 3.5 x 26 mm resolute Onyx DES postdilated to high pressure with a 4.0 mm noncompliant balloon.  Diagnostic Dominance: Right Intervention    Implants    Cardiac cath 09/03/21:  Orbie Pyo, MD (Primary)    [place], Shirin Echeverry M, MD (Assisting)  Procedures  Intravascular Ultrasound/IVUS  LEFT HEART CATH AND CORS/GRAFTS ANGIOGRAPHY   Conclusion      Dist LM lesion is 15% stenosed.   Prox RCA lesion is 75% stenosed.   Mid RCA to Dist RCA lesion is 100% stenosed with 100% stenosed side branch in RPAV.   Origin to Prox Graft lesion before RPDA  is 20% stenosed.   Non-stenotic Mid Graft-1 lesion before RPDA was previously treated.   Non-stenotic Mid Graft-2 lesion before RPDA was previously treated.   Seq SVG- and is large.   LIMA and is small.   There is competitive flow.   LV end diastolic pressure is normal.    Patent vein graft to PDA with minimal constraint; IVUS interrogation demonstrated a minimal stent area of 5.65mm2. Patent LIMA to LAD with relatively unchanged burden of disease elsewhere. Normal LVEDP   Recommendations:  Medical management.   Cardiac cath 07/15/22:  LEFT HEART CATH AND CORS/GRAFTS ANGIOGRAPHY   Conclusion      Dist LM lesion is 15% stenosed.   Prox RCA lesion is 75% stenosed.   Mid RCA to Dist RCA lesion is 100% stenosed with 100% stenosed side branch in RPAV.   Origin to Prox Graft lesion before RPDA  is 30% stenosed.   Prox Graft to Mid Graft lesion before RPDA  is 20% stenosed.   Non-stenotic Mid Graft-1 lesion before RPDA was previously treated.   Non-stenotic Mid Graft-2 lesion before RPDA was previously treated.   Seq SVG- and is large.   LIMA and is small.   There is competitive flow.   The left ventricular systolic function is normal.   LV end diastolic pressure is normal.   The left  ventricular ejection fraction is greater than 65% by visual estimate.   2 vessel obstructive CAD Patent LIMA to the LAD Patent SVG to RCA. Mild nonobstructive disease Normal LV function Normal LVEDP   Plan: continue medical therapy.  Coronary Diagrams  Diagnostic Dominance: Right  ASSESSMENT AND PLAN:  1. CAD status post CABG in 1995.  s/p NSTEMI in Feb. Cardiac cath demonstrated patent LIMA to the LAD. Occluded native RCA. SVG to PDA/PL had severe sequential stenoses treated with DES x 2. Normal EF.  Developed dyspnea on Brilinta. Allergic reaction to Plavix. Now on Prasugrel lower dose due to age and female sex. Had recurrent angina in July 2022. Restenosis in the proximal stent in the SVG to RCA with some associated stent fracture. Had repeat stenting with IVUS guidance. Last cardiac cath August 2023 showed stable disease with only 30% in stent stenosis in SVG to RCA. Normal LV function and normal LVEDP. She has stable angina.   2. HTN with hypertensive heart disease. BP is elevated today but has been in excellent control   3. Hypercholesterolemia- LDL 58 on statin and Zetia.   4.  Status post stent to right renal artery 2008. Patent by CT in October  5. Status post stent graft to abdominal aortic aneurysm 2009.  S/p iliac stent in 2016.follow up with [facility].   6. Peripheral artery disease followed by Dr.Cain  7. Lumbar spine disease. S/p fusion with persistent pain.  8. Persistent SOB- suspect this is more related to her emphysema. Prior caths have all shown normal LVEDP. Will update Echo. Decided to have her do a trial of demadex 10 mg daily for a week to see if she notes any clinical benefit. Will determine follow up after Echo   Dispostion: TBD   Signed, Theron Arista  [place] MD, [facility]    03/24/2023 2:48 PM    [facility]

## 2023-03-24 ENCOUNTER — Ambulatory Visit: Payer: Medicare Other | Attending: Cardiology | Admitting: Cardiology

## 2023-03-24 ENCOUNTER — Encounter: Payer: Self-pay | Admitting: Cardiology

## 2023-03-24 VITALS — BP 124/62 | HR 84 | Ht 60.0 in | Wt 131.5 lb

## 2023-03-24 DIAGNOSIS — I25708 Atherosclerosis of coronary artery bypass graft(s), unspecified, with other forms of angina pectoris: Secondary | ICD-10-CM | POA: Diagnosis not present

## 2023-03-24 DIAGNOSIS — Z95828 Presence of other vascular implants and grafts: Secondary | ICD-10-CM | POA: Diagnosis not present

## 2023-03-24 DIAGNOSIS — R0602 Shortness of breath: Secondary | ICD-10-CM | POA: Diagnosis not present

## 2023-03-24 DIAGNOSIS — I10 Essential (primary) hypertension: Secondary | ICD-10-CM | POA: Diagnosis not present

## 2023-03-24 DIAGNOSIS — J9601 Acute respiratory failure with hypoxia: Secondary | ICD-10-CM | POA: Diagnosis not present

## 2023-03-24 DIAGNOSIS — J4489 Other specified chronic obstructive pulmonary disease: Secondary | ICD-10-CM | POA: Diagnosis not present

## 2023-03-24 NOTE — Patient Instructions (Signed)
Medication Instructions:  Start Demadex 10 mg daily for 1 week  Call and let Dr.Jordan know if helps Continue all other medications  *If you need a refill on your cardiac medications before your next appointment, please call your pharmacy*   Lab Work: None ordered   Testing/Procedures: Schedule Echo   Follow-Up: At [facility], you and your health needs are our priority.  As part of our continuing mission to provide you with exceptional heart care, we have created designated Provider Care Teams.  These Care Teams include your primary Cardiologist (physician) and Advanced Practice Providers (APPs -  Physician Assistants and Nurse Practitioners) who all work together to provide you with the care you need, when you need it.  We recommend signing up for the patient portal called "MyChart".  Sign up information is provided on this After Visit Summary.  MyChart is used to connect with patients for Virtual Visits (Telemedicine).  Patients are able to view lab/test results, encounter notes, upcoming appointments, etc.  Non-urgent messages can be sent to your provider as well.   To learn more about what you can do with MyChart, go to [email or web address].    Your next appointment:  To Be Determined after Echo    Provider:  Dr.Jordan

## 2023-03-25 ENCOUNTER — Telehealth: Payer: Self-pay | Admitting: Internal Medicine

## 2023-03-25 DIAGNOSIS — J9611 Chronic respiratory failure with hypoxia: Secondary | ICD-10-CM

## 2023-03-25 NOTE — Telephone Encounter (Signed)
She is so sweet and asked [place] to please call her back to let us know what is happening. TY.

## 2023-03-25 NOTE — Telephone Encounter (Signed)
PT calling saying ONO test and Humidifier ordered have not come thru. Dr. Roxy Cedar AVS notes state:  We will order humidification for your home concentrator   Will look into the overnight oximetry test, oftentimes these get delayed for weeks due to lack of equipment but we will look into it with the company who was going to supply the equipment.  I did not see ONO order in [employer].   Please call PT to advise. Her # is [phone]

## 2023-03-28 NOTE — Telephone Encounter (Signed)
Called and spoke with patient. I advised her that the ONO and humidification cup orders had been sent to [employer]. I advised her that I would call [employer] to confirm that they have the orders. She verbalized understanding.   Called [employer] and spoke with Loraine Leriche. He stated that they received the orders but for some reason they were voided out of their system. He could not ask the person who voided the orders because they were no longer with the company. He advised [place] to place the orders again and start over again. I advised him I would do so. New orders have been placed.   Called and spoke with patient. She is aware that I have placed the orders again. I will call [employer] tomorrow to confirm that they have the order. Will leave this encounter open for follow up.

## 2023-03-29 ENCOUNTER — Other Ambulatory Visit: Payer: Self-pay

## 2023-03-29 ENCOUNTER — Telehealth: Payer: Self-pay | Admitting: Diagnostic Neuroimaging

## 2023-03-29 MED ORDER — GABAPENTIN 300 MG PO CAPS: 300.0000 mg | ORAL_CAPSULE | Freq: Two times a day (BID) | ORAL | 3 refills | Status: DC

## 2023-03-29 MED ORDER — GABAPENTIN 300 MG PO CAPS: 300.0000 mg | ORAL_CAPSULE | Freq: Three times a day (TID) | ORAL | 3 refills | Status: DC

## 2023-03-29 NOTE — Telephone Encounter (Addendum)
Pt called needing a refill request for her gabapentin (NEURONTIN) 300 MG [facility] [address] Pt states she is completely out of the medication.

## 2023-03-29 NOTE — Addendum Note (Signed)
Addended by: Berneice Gandy A on: 03/29/2023 10:20 AM   Modules accepted: Orders

## 2023-04-04 ENCOUNTER — Encounter: Payer: Self-pay | Admitting: Cardiology

## 2023-04-07 DIAGNOSIS — G4730 Sleep apnea, unspecified: Secondary | ICD-10-CM | POA: Diagnosis not present

## 2023-04-07 DIAGNOSIS — R0683 Snoring: Secondary | ICD-10-CM | POA: Diagnosis not present

## 2023-04-08 ENCOUNTER — Ambulatory Visit: Payer: Medicare Other | Admitting: Cardiology

## 2023-04-08 ENCOUNTER — Encounter: Payer: Self-pay | Admitting: Cardiology

## 2023-04-08 NOTE — Telephone Encounter (Signed)
  I would have her hold amlodipine and continue to monitor  Peter Swaziland MD, [facility]

## 2023-04-08 NOTE — Telephone Encounter (Signed)
Spoke  to patient. Rather than send a mychart message.   Information given to patient per Dr Swaziland response.   Patient wanted to know  I when should she restart medications.   RN informed patient to continue to monitor asked Dr Swaziland suggested - she can keep a log of blood pressure over the next 2- 3 weeks.--3 to 4 times a week and can send reading for review or call into the office. If you fell dizzy  you can record  reading time a date. Patient voice undersatnding.

## 2023-04-14 ENCOUNTER — Telehealth: Payer: Self-pay | Admitting: Primary Care

## 2023-04-14 NOTE — Telephone Encounter (Signed)
Overnight oximetry test 04/07/23 on room air showed patient spent 4 hours 24 minutes with SpO2 low less than 88%.  SpO2 low 70%, baseline 86%.  Patient needs to continue to wear 2-3 L oxygen at bedtime

## 2023-04-15 NOTE — Telephone Encounter (Signed)
Attempted to call pt but unable to reach. Left message to return call.  

## 2023-04-15 NOTE — Telephone Encounter (Signed)
Patient is returning phone call. Patient phone number is [phone].

## 2023-04-15 NOTE — Telephone Encounter (Signed)
Called and spoke with patient. She verbalized understanding of results. She stated that the night of the test was a rough night. She cough continuously and was awake for most of the night. She wonders if this would play a role in her results.   Beth, can you please advise? Thanks!

## 2023-04-18 ENCOUNTER — Encounter: Payer: Self-pay | Admitting: Pulmonary Disease

## 2023-04-18 ENCOUNTER — Ambulatory Visit (INDEPENDENT_AMBULATORY_CARE_PROVIDER_SITE_OTHER): Payer: Medicare Other

## 2023-04-18 ENCOUNTER — Telehealth: Payer: Self-pay | Admitting: Pulmonary Disease

## 2023-04-18 ENCOUNTER — Ambulatory Visit: Payer: Medicare Other | Admitting: Pulmonary Disease

## 2023-04-18 VITALS — BP 140/60 | HR 97 | Ht 60.0 in | Wt 133.4 lb

## 2023-04-18 DIAGNOSIS — R6883 Chills (without fever): Secondary | ICD-10-CM | POA: Diagnosis not present

## 2023-04-18 DIAGNOSIS — R051 Acute cough: Secondary | ICD-10-CM

## 2023-04-18 MED ORDER — PREDNISONE 20 MG PO TABS: 40.0000 mg | ORAL_TABLET | Freq: Every day | ORAL | 0 refills | Status: AC

## 2023-04-18 MED ORDER — HYDROCODONE BIT-HOMATROP MBR 5-1.5 MG/5ML PO SOLN: 5.0000 mL | Freq: Every evening | ORAL | 0 refills | Status: DC | PRN

## 2023-04-18 MED ORDER — AZITHROMYCIN 250 MG PO TABS: ORAL_TABLET | ORAL | 0 refills | Status: DC

## 2023-04-18 NOTE — Telephone Encounter (Signed)
Pt calling bc she was seen today and was prescribed HYDROcodone bit-homatropine (HYCODAN) 5-1.5 MG/5ML syrup and they do not have it in stock,

## 2023-04-18 NOTE — Telephone Encounter (Signed)
Pt seen in clinic 04/18/23

## 2023-04-18 NOTE — Telephone Encounter (Signed)
ATC X1 LVM for patient to call the office back. Have patient call other pharmacys to see who has medication in stock

## 2023-04-18 NOTE — Progress Notes (Signed)
@Patient  ID: Carolynn Serve, female    DOB: 01-Nov-1946, 77 y.o.   MRN: [id number]  Chief Complaint  Patient presents with   Acute Visit    Cough, chills, wheezing, phlegm (green)    Referring provider: Willow Ora, MD  HPI:   77 y.o. whom are seeing for acute visit for evaluation of cough productive green phlegm, wheezing, chills previously seen for evaluation of dyspnea on exertion related to emphysema and likely cardiac causes.  Most recent Cardiology  note reviewed.  Started symptoms about 5 to 6 days ago.  Coughing.  No sore throat.  A lot of upper airway congestion.  Developed some wheeze.  Some chills.  Tested negative for COVID.  Given things were improving she requested to be seen.  She reports adherence to [employer].  Using rescue Hailer some with mild improvement.  HPI at initial visit: She was in her usual state of health.  She developed onset of shortness of breath a few weeks ago.  Different than baseline dyspnea.  Recurrent shorter distances.  Preceded with aches, sounds like viral infection.  Still some heart ache as well which is concerning to her given her early CAD and significant coronary history.  This prompted presentation to the ED.  There CTA PE protocol was negative for PE but showed presevere emphysematous changes with predilection for the upper lobes otherwise clear on my review and interpretation.  She subsequent underwent a left heart catheterization showed stable coronary artery disease and prior PCI and CABG.  This was reassuring.  In the interim she was placed on Breztri.  She flexes helped some with her shortness of breath.  Able to do more.  Still get a little bit winded on longer walks etc.  She used to walk a mile and a half a day.  She is working back to getting to that distance.  PMH: Tobacco abuse in remission, hypertension, CAD, asthma, hyperlipidemia, seasonal allergies Surgical history: Back surgery, CABG, PCI Family history: Mother and father with  CAD Social history: Former smoker, quit 1997, lives in [place] / Pulmonary Flowsheets:   ACT:      No data to display          MMRC:     No data to display          Epworth:      No data to display          Tests:   FENO:  No results found for: "NITRICOXIDE"  PFT:    Latest Ref Rng & Units 03/03/2023    1:20 PM  PFT Results  FVC-Pre L 1.85   FVC-Predicted Pre % 83   FVC-Post L 1.84   FVC-Predicted Post % 82   Pre FEV1/FVC % % 76   Post FEV1/FCV % % 76   FEV1-Pre L 1.41   FEV1-Predicted Pre % 85   FEV1-Post L 1.39   DLCO uncorrected ml/min/mmHg 9.07   DLCO UNC% % 55   DLCO corrected ml/min/mmHg 8.81   DLCO COR %Predicted % 53   DLVA Predicted % 67   TLC L 3.82   TLC % Predicted % 85   RV % Predicted % 92     WALK:     02/17/2023    4:46 PM  SIX MIN WALK  Supplimental Oxygen during Test? (L/min) Yes  O2 Flow Rate 2 L/min  Type Continuous  Tech Comments: Patient SaO2 when brought back to exam room was 88%  on room air.  Patient was immediately put on oxygen 2 lpm continuous.  After 5 minutes, SaO2 at 93%. Patient was able to walk 1 lap on 2 lpm continuous oxygen (tank) and SaO2 at 94%.  Patient c/o fatigue.  Patient was unable to walk any further laps.  Patient taken back to exam room. and SaO2 at  94% and heart rate 80 bpm.  Patient left with an oxygen tank from [employer] (from closet).  Oxygen set on 2 lpm when patient taken to waiting room.  Patient did require assistance to get the oxygen tank/roller in her car.    Imaging: Personally reviewed and as per EMR discussion in this note No results found.  Lab Results: Personally reviewed CBC    Component Value Date/Time   WBC 15.0 (H) 02/17/2023 1026   RBC 4.66 02/17/2023 1026   HGB 14.4 02/17/2023 1026   HGB 15.2 06/29/2022 1538   HCT 41.7 02/17/2023 1026   HCT 45.3 06/29/2022 1538   PLT 259.0 02/17/2023 1026   PLT 272 06/29/2022 1538   MCV 89.3 02/17/2023 1026   MCV  89 06/29/2022 1538   MCH 29.5 07/11/2022 1912   MCHC 34.5 02/17/2023 1026   RDW 12.8 02/17/2023 1026   RDW 12.4 06/29/2022 1538   LYMPHSABS 1.2 02/17/2023 1026   LYMPHSABS 2.0 01/06/2022 0954   MONOABS 0.9 02/17/2023 1026   EOSABS 0.1 02/17/2023 1026   EOSABS 0.2 01/06/2022 0954   BASOSABS 0.0 02/17/2023 1026   BASOSABS 0.0 01/06/2022 0954    BMET    Component Value Date/Time   NA 138 02/17/2023 1026   NA 143 06/29/2022 1538   K 4.1 02/17/2023 1026   CL 103 02/17/2023 1026   CO2 25 02/17/2023 1026   GLUCOSE 119 (H) 02/17/2023 1026   BUN 19 02/17/2023 1026   BUN 7 (L) 06/29/2022 1538   CREATININE 1.16 02/17/2023 1026   CREATININE 0.71 06/29/2016 1057   CALCIUM 9.9 02/17/2023 1026   GFRNONAA >60 07/11/2022 1912   GFRAA 89 01/01/2020 0835    BNP    Component Value Date/Time   BNP 94.2 06/29/2022 1538   BNP 862.1 (H) 01/08/2021 1630    ProBNP    Component Value Date/Time   PROBNP 145.0 (H) 02/17/2023 1026    Specialty Problems       Pulmonary Problems   COPD with chronic bronchitis   Centrilobular emphysema (HCC)   Chronic respiratory failure (HCC)   Pneumonia    Allergies  Allergen Reactions   Brilinta [Ticagrelor] Shortness Of Breath   Bextra [Valdecoxib] Other (See Comments)    Increased lft's    Hydrocod Poli-Chlorphe Poli Er Rash   Lipitor [Atorvastatin] Other (See Comments)    Leg weakness    Mevacor [Lovastatin] Other (See Comments)    Muscle weakness    Plavix [Clopidogrel Bisulfate] Itching and Other (See Comments)    Hands and feet tingle and itch   Zocor [Simvastatin] Other (See Comments)    Bones hurt    Doxycycline Diarrhea and Nausea And Vomiting   Metoprolol Other (See Comments)    Hair loss   Morphine And Related Itching and Nausea Only   Codeine Itching and Rash    Hyper-active   Lisinopril Cough    Immunization History  Administered Date(s) Administered   Covid-19, Mrna,Vaccine(Spikevax)42yrs and older 02/11/2023    Fluad Quad(high Dose 65+) 07/30/2019, 08/10/2022   Influenza, High Dose Seasonal PF 08/13/2017, 08/26/2018, 09/15/2020   Influenza, Seasonal, Injecte, Preservative Fre 09/21/2014,  09/06/2015, 07/21/2016   Influenza,inj,quad, With Preservative 08/06/2017   Influenza-Unspecified 08/10/2017, 10/13/2021   PFIZER(Purple Top)SARS-COV-2 Vaccination 12/22/2019, 01/12/2020, 09/15/2020   PPD Test 09/13/1979, 03/10/2021   Pfizer Covid-19 Vaccine Bivalent Booster 71yrs & up 10/13/2021   Pneumococcal Conjugate-13 12/31/2016   Pneumococcal Polysaccharide-23 12/16/2014, 08/01/2019   Respiratory Syncytial Virus Vaccine,Recomb Aduvanted(Arexvy) 10/06/2022   Td 05/13/2008   Td (Adult), 2 Lf Tetanus Toxid, Preservative Free 05/13/2008   Tdap 08/09/2012   Zoster Recombinat (Shingrix) 01/13/2018, 03/14/2018   Zoster, Live 11/03/2010    Past Medical History:  Diagnosis Date   AAA (abdominal aortic aneurysm) (HCC)    a. s/p stent grafting in 2009.   Adenomatous colon polyp 05/2001   Adenomatous duodenal polyp    Allergy    Anginal pain (HCC)    COPD (chronic obstructive pulmonary disease) (HCC)    pt denies COPD   Coronary artery disease    a. s/p CABG in 1995;  b. 05/2013 Neg MV, EF 82%;  c. 06/2013 Cath: LM nl, LAD 40-50p, LCX nl, RCA 80p/175m, VG->RCA->PDA->PLSA min irregs, LIMA->LAD atretic, vigorous LV fxn.   Diabetes mellitus without complication (HCC)    history of, resolved. 2017   Diverticulosis    Eczema, dyshidrotic    Eczema, dyshidrotic 1986   Dr. Gerilyn Nestle   Emphysema of lung [facility])    Fatty liver    Fibromyalgia    GERD (gastroesophageal reflux disease)    Hyperlipidemia    Hypertension    Migraine    Myocardial infarction [facility])    Osteoarthritis    Osteoporosis    unsure, having a bone scan soon   Peripheral arterial disease (HCC)    left leg diagnosed by Dr Patty Sermons   Renal artery stenosis [facility])    a. 2008 s/p PTA    Tobacco History: Social History   Tobacco Use   Smoking Status Former   Packs/day: 0.00   Years: 37.00   Additional pack years: 0.00   Total pack years: 0.00   Types: Cigarettes   Quit date: 12/07/1995   Years since quitting: 27.3   Passive exposure: Never  Smokeless Tobacco Never   Counseling given: Not Answered   Continue to not smoke  Outpatient Encounter Medications as of 04/18/2023  Medication Sig   Accu-Chek FastClix Lancets MISC Check blood sugar once daily PRN   albuterol (PROAIR HFA) 108 (90 Base) MCG/ACT inhaler Inhale 2 puffs into the lungs every 6 (six) hours as needed for wheezing or shortness of breath.   albuterol (PROVENTIL) (2.5 MG/3ML) 0.083% nebulizer solution Take 3 mLs (2.5 mg total) by nebulization every 6 (six) hours as needed for wheezing or shortness of breath.   ALPRAZolam (XANAX) 0.25 MG tablet Take 1 tablet (0.25 mg total) by mouth at bedtime as needed for sleep.   amLODipine (NORVASC) 5 MG tablet Take 1 tablet (5 mg total) by mouth daily.   aspirin EC 81 MG tablet Take 1 tablet (81 mg total) by mouth daily.   atorvastatin (LIPITOR) 80 MG tablet TAKE 1 TABLET BY MOUTH EVERY DAY   azithromycin (ZITHROMAX) 250 MG tablet Take 2 tablet (500 mg) once daily on day 1 followed by 1 tablet (250 mg) once daily on days 2 through 5   B Complex-Biotin-FA (B-100 COMPLEX PO) Take 1 tablet by mouth every evening.   bismuth subsalicylate (PEPTO BISMOL) 262 MG/15ML suspension Take 30 mLs by mouth every 6 (six) hours as needed for indigestion or diarrhea or loose stools.   Budeson-Glycopyrrol-Formoterol (BREZTRI  AEROSPHERE) 160-9-4.8 MCG/ACT AERO Inhale 2 puffs into the lungs in the morning and at bedtime.   cetirizine (ZYRTEC) 10 MG tablet Take 10 mg by mouth daily as needed for allergies.   cholecalciferol (VITAMIN D3) 25 MCG (1000 UNIT) tablet Take 1,000 Units by mouth every evening.   CORDRAN 4 MCG/SQCM TAPE Apply 1 each topically daily as needed (Rash).    cycloSPORINE (RESTASIS) 0.05 % ophthalmic emulsion Place 1 drop  into both eyes 2 (two) times daily.    Ergocalciferol 50 MCG (2000 UT) CAPS Take by mouth.   ezetimibe (ZETIA) 10 MG tablet TAKE 1 TABLET BY MOUTH EVERY DAY   gabapentin (NEURONTIN) 300 MG capsule Take 1 capsule (300 mg total) by mouth 2 (two) times daily.   glucose blood (ACCU-CHEK SMARTVIEW) test strip CHECK BLOOD SUGAR ONCE DAILY AS NEEDED   hydrochlorothiazide (HYDRODIURIL) 12.5 MG tablet TAKE 1 TABLET BY MOUTH EVERY DAY   HYDROcodone bit-homatropine (HYCODAN) 5-1.5 MG/5ML syrup Take 5 mLs by mouth at bedtime as needed for cough.   isosorbide mononitrate (IMDUR) 30 MG 24 hr tablet Take 3 tablets ( 90 mg ) every morning and 1 tablet (30 mg ) every evening (Patient taking differently: Take 30-90 mg by mouth See admin instructions. Take 3 tablets ( 90 mg ) every morning and 1 or 2 tablets (30 - 60 mg ) every evening)   losartan (COZAAR) 100 MG tablet Take 1 tablet (100 mg total) by mouth daily.   metoprolol succinate (TOPROL XL) 25 MG 24 hr tablet Take 1 tablet (25 mg total) by mouth daily.   Multiple Vitamins-Minerals (MULTIVITAMIN WITH MINERALS) tablet Take 1 tablet by mouth every evening.   nitroGLYCERIN (NITROSTAT) 0.4 MG SL tablet Place 1 tablet (0.4 mg total) under the tongue every 5 (five) minutes as needed for chest pain.   omeprazole (PRILOSEC) 40 MG capsule Take 1 capsule (40 mg total) by mouth in the morning and at bedtime.   oxyCODONE-acetaminophen (PERCOCET) 10-325 MG tablet Take 1 tablet by mouth in the morning and at bedtime. Can take one tablet three times a day as needed for back pain   polyethylene glycol (MIRALAX / GLYCOLAX) 17 g packet Take 17 g by mouth daily as needed for moderate constipation.   prasugrel (EFFIENT) 5 MG TABS tablet TAKE 1 TABLET (5 MG TOTAL) BY MOUTH DAILY.   predniSONE (DELTASONE) 20 MG tablet Take 2 tablets (40 mg total) by mouth daily with breakfast for 5 days.   PRESCRIPTION MEDICATION Place 2 puffs into both ears daily as needed (ear infections). CSAHC  ear powder   Simethicone (GAS-X PO) Take 1-2 tablets by mouth daily as needed (gas).   torsemide (DEMADEX) 20 MG tablet Take by mouth.   tretinoin (RETIN-A) 0.1 % cream Apply 1 application. topically every evening.   No facility-administered encounter medications on file as of 04/18/2023.     Review of Systems  Review of Systems  N/a Physical Exam  BP (!) 140/60 (BP Location: Left Arm)   Pulse 97   Ht 5' (1.524 m)   Wt 133 lb 6.4 oz (60.5 kg)   SpO2 (!) 89%   BMI 26.05 kg/m   Wt Readings from Last 5 Encounters:  04/18/23 133 lb 6.4 oz (60.5 kg)  03/24/23 131 lb 8 oz (59.6 kg)  03/15/23 133 lb 6.4 oz (60.5 kg)  02/23/23 130 lb (59 kg)  02/17/23 133 lb (60.3 kg)    BMI Readings from Last 5 Encounters:  04/18/23 26.05 kg/m  03/24/23 25.68 kg/m  03/15/23 26.05 kg/m  02/23/23 25.39 kg/m  02/17/23 25.97 kg/m     Physical Exam General: Well-appearing, sitting in chair Eyes: EOMI, no icterus Neck: Supple, no JVP Pulmonary: Diffuse end  expiratory wheezing, normal work of breathing Cardiovascular warm, no edema Abdomen: Nondistended, bowel sounds present MSK: No synovitis, no joint effusion Neuro: Normal gait, no weakness Psych: Normal mood, full affect   Assessment & Plan:   Dyspnea on exertion: Likely multifactorial related to cardiac issues, worsening diastology with exercise given significant left atrial dilation.  Also suspect large contribution from emphysema.  Severe emphysema on imaging.  Continue Breztri.  Continue cardiology follow-up.  PFTs normal with exception of decreased DLCO due to emphysema.  Acute cough, chills: Started with URI.  Suspect developing bronchitis.  Mild wheeze on exam.  Otherwise clear.  Chest x-ray today.  Prednisone 40 mg day for 5 days, Z-Pak.  Chronic hypoxemic respite failure: Due to significant emphysematous changes on CT scan.  Stressed importance of keeping oxygen saturation 88% or above at rest and with exertion.  Return  in about 5 months (around 09/18/2023).   Karren Burly, MD 04/18/2023

## 2023-04-18 NOTE — Patient Instructions (Addendum)
Chest x-ray today  Take prednisone 40 mg once a day for 5 days  Take azithromycin 2 tablets (500 mg) on day 1, then 1 tablet (250 mg) once daily on days 2 through 5  I expect you will be improved in the next couple of days  Return to clinic in about 5 months, schedule not out that far, we will have to get you scheduled at a later date

## 2023-04-19 DIAGNOSIS — R531 Weakness: Secondary | ICD-10-CM | POA: Diagnosis not present

## 2023-04-19 NOTE — Telephone Encounter (Signed)
Patient called back with the information requested.  [facility] [address]  The phone # is [phone]

## 2023-04-20 ENCOUNTER — Telehealth: Payer: Self-pay | Admitting: Primary Care

## 2023-04-20 MED ORDER — HYDROCODONE BIT-HOMATROP MBR 5-1.5 MG/5ML PO SOLN: 5.0000 mL | Freq: Every evening | ORAL | 0 refills | Status: DC | PRN

## 2023-04-20 NOTE — Telephone Encounter (Signed)
Dr. [name] can you please send refill of [facility]cod[facility] cough syrup to [facility] [address].

## 2023-04-20 NOTE — Telephone Encounter (Signed)
Pt calling in to get her result from CT scan on Monday 04/18/2023

## 2023-04-20 NOTE — Telephone Encounter (Signed)
CXR looks unchanged to me - report not yet back. Stick with current plan - if still not seeing improvement by Friday I can call in an additional antibiotic and extend prednisone course if needed.

## 2023-04-20 NOTE — Telephone Encounter (Signed)
d 

## 2023-04-20 NOTE — Telephone Encounter (Signed)
Refill of hycodan has been sent to pharmacy pt is aware. Nfn

## 2023-04-20 NOTE — Telephone Encounter (Signed)
Called and spoke with patient. She was calling to get her CXR results from Monday. I advised her that the radiologist had not reviewed her CXR yet. She verbalized understanding.   While on the phone, she mentioned that she still has a productive cough with thick green phlegm. Denied any increased SOB or wheezing. She woke up with a slight fever this morning of 99.66F and she has been taking aspirin for the fever. Per patient, anything over 36F is a fever for her. Denied any chills. She has already started the zpak and prednisone that was sent in on 5/13. I advised her that I would send a message to MH to let him know.   MH, can you please advise? Thanks!

## 2023-04-20 NOTE — Telephone Encounter (Signed)
Sent!

## 2023-04-20 NOTE — Telephone Encounter (Signed)
Pt returning missed call. 

## 2023-04-20 NOTE — Telephone Encounter (Signed)
PT calling again and having a hard time without this cough syrup. Please call in. (Sending back as High Priority because she is a sickie.)

## 2023-04-20 NOTE — Telephone Encounter (Signed)
Spoke with patient. Advised to call back Friday if no improvement. Advised on CXR results. She verbalized understanding. NFN

## 2023-04-20 NOTE — Telephone Encounter (Signed)
Called patient but she did not answer. Left message for patient to call back.  

## 2023-04-21 NOTE — Telephone Encounter (Signed)
Yes needs oxygen, cough wouldn't cause this

## 2023-04-21 NOTE — Telephone Encounter (Signed)
I called and spoke with the pt and notified of response per Beth °She verbalized understanding  °Nothing further needed °

## 2023-04-22 ENCOUNTER — Encounter: Payer: Self-pay | Admitting: Pulmonary Disease

## 2023-04-22 ENCOUNTER — Telehealth: Payer: Self-pay | Admitting: Pulmonary Disease

## 2023-04-22 MED ORDER — CEFDINIR 300 MG PO CAPS: 300.0000 mg | ORAL_CAPSULE | Freq: Two times a day (BID) | ORAL | 0 refills | Status: DC

## 2023-04-22 NOTE — Telephone Encounter (Signed)
Pt states she is still not feeling well. She is coughing up mucous (green in color), and she is also non-productive coughing as well. No fever. She is taking what MH prescribed last encounter, but it is not well. [facility] [address]. MH advised for her to call again in order to reup her meds. Please advise.

## 2023-04-22 NOTE — Telephone Encounter (Signed)
Additional antibiotic, cefdinir 1 tablet twice a day for 7 days sent to pharmacy.  If getting worse through the weekend I advise she go to the emergency room given failure of outpatient treatment.  If not improving by early next week then I advise the same.

## 2023-04-22 NOTE — Telephone Encounter (Signed)
I called and spoke with the pt  She was seen on 04/18/23  Still on zpack and pred  She states has worsening cough, wheezing and SOB past 2 days  She is coughing up green sputum  She states that she has not had any fevers, aches, sweats She is on breztri, mucinex, albuterol  Dr [name]- please advise thanks!  Allergies  Allergen Reactions   Brilinta [Ticagrelor] Shortness Of Breath   Bextra [Valdecoxib] Other (See Comments)    Increased lft's    Hydrocod Poli-Chlorphe Poli Er Rash   Lipitor [Atorvastatin] Other (See Comments)    Leg weakness    Mevacor [Lovastatin] Other (See Comments)    Muscle weakness    Plavix [Clopidogrel Bisulfate] Itching and Other (See Comments)    Hands and feet tingle and itch   Zocor [Simvastatin] Other (See Comments)    Bones hurt    Doxycycline Diarrhea and Nausea And Vomiting   Metoprolol Other (See Comments)    Hair loss   Morphine And Codeine Itching and Nausea Only   Codeine Itching and Rash    Hyper-active   Lisinopril Cough

## 2023-04-22 NOTE — Telephone Encounter (Signed)
Spoke with patient. Advised additional antibiotic has been sent to pharmacy. Also advised if getting worse to seek emergency treatment. NFN    Dr. [name] patient also wants to know if you would refill Cough syrup

## 2023-04-22 NOTE — Telephone Encounter (Signed)
I sent a new prescription 5/15 - instructions to use at night as needed. Volume prescribed is enough for 24 doses.  Has she ran out already?

## 2023-04-22 NOTE — Telephone Encounter (Signed)
Called patient. She states she picked up prescription on Wednesday. Nfn

## 2023-04-23 DIAGNOSIS — J4489 Other specified chronic obstructive pulmonary disease: Secondary | ICD-10-CM | POA: Diagnosis not present

## 2023-04-23 DIAGNOSIS — J9601 Acute respiratory failure with hypoxia: Secondary | ICD-10-CM | POA: Diagnosis not present

## 2023-04-25 ENCOUNTER — Other Ambulatory Visit (HOSPITAL_COMMUNITY): Payer: Medicare Other

## 2023-04-25 ENCOUNTER — Ambulatory Visit (HOSPITAL_COMMUNITY): Payer: Medicare Other | Attending: Cardiology

## 2023-04-25 DIAGNOSIS — I10 Essential (primary) hypertension: Secondary | ICD-10-CM | POA: Diagnosis not present

## 2023-04-25 DIAGNOSIS — M5416 Radiculopathy, lumbar region: Secondary | ICD-10-CM | POA: Diagnosis not present

## 2023-04-25 DIAGNOSIS — R0602 Shortness of breath: Secondary | ICD-10-CM | POA: Diagnosis not present

## 2023-04-25 DIAGNOSIS — I25708 Atherosclerosis of coronary artery bypass graft(s), unspecified, with other forms of angina pectoris: Secondary | ICD-10-CM | POA: Diagnosis not present

## 2023-04-25 DIAGNOSIS — Z95828 Presence of other vascular implants and grafts: Secondary | ICD-10-CM | POA: Diagnosis not present

## 2023-04-25 DIAGNOSIS — M48061 Spinal stenosis, lumbar region without neurogenic claudication: Secondary | ICD-10-CM | POA: Diagnosis not present

## 2023-04-25 LAB — ECHOCARDIOGRAM COMPLETE
Area-P 1/2: 2.95 cm2
S' Lateral: 1.8 cm

## 2023-04-26 ENCOUNTER — Telehealth: Payer: Self-pay | Admitting: Pulmonary Disease

## 2023-04-26 NOTE — Telephone Encounter (Signed)
Patient states needs refill for Hycodan. [facility] [address]. Patient phone number is [phone].

## 2023-04-27 ENCOUNTER — Other Ambulatory Visit: Payer: Self-pay | Admitting: Cardiology

## 2023-04-27 MED ORDER — HYDROCODONE BIT-HOMATROP MBR 5-1.5 MG/5ML PO SOLN: 5.0000 mL | Freq: Every evening | ORAL | 0 refills | Status: DC | PRN

## 2023-04-27 NOTE — Telephone Encounter (Signed)
Pt called the office again after not hearing anything from [place] yesterday 5/21 when she first called the office.  Stated that she has really been coughing a lot and is wanting to have her hycodan refilled as her cough is worse in the mornings around 2am. Pt said she has been waking up coughing and the only thing that helps with her cough is the hycodan cough syrup.  Told pt that Dr. [name] is not available today and she was wanting to know if Waynetta Sandy might be willing to refill this for her in his place.  Beth, please advise. Pharmacy is [facility].

## 2023-04-27 NOTE — Telephone Encounter (Signed)
Spoke with patient. Advised of cough syrup refill and recommendations from [facility]. She verbalized understanding. NFN

## 2023-04-27 NOTE — Telephone Encounter (Signed)
Yes ill refill. Use sparingly at bedtime only, not intended for long term use. Should taper off and use over the counter suppressants like delsym cough syrup and/or spoon full of honey to coat the throat as needed.

## 2023-04-28 ENCOUNTER — Ambulatory Visit (INDEPENDENT_AMBULATORY_CARE_PROVIDER_SITE_OTHER): Payer: Medicare Other | Admitting: Family Medicine

## 2023-04-28 ENCOUNTER — Encounter: Payer: Self-pay | Admitting: Family Medicine

## 2023-04-28 VITALS — BP 120/46 | HR 83 | Temp 98.0°F | Ht 60.0 in | Wt 130.8 lb

## 2023-04-28 DIAGNOSIS — J4489 Other specified chronic obstructive pulmonary disease: Secondary | ICD-10-CM | POA: Diagnosis not present

## 2023-04-28 DIAGNOSIS — I10 Essential (primary) hypertension: Secondary | ICD-10-CM

## 2023-04-28 DIAGNOSIS — J441 Chronic obstructive pulmonary disease with (acute) exacerbation: Secondary | ICD-10-CM

## 2023-04-28 DIAGNOSIS — Z9981 Dependence on supplemental oxygen: Secondary | ICD-10-CM

## 2023-04-28 MED ORDER — PREDNISONE 10 MG PO TABS
ORAL_TABLET | ORAL | 0 refills | Status: DC
Start: 2023-04-28 — End: 2023-06-07

## 2023-04-28 MED ORDER — CIPROFLOXACIN HCL 500 MG PO TABS: 500.0000 mg | ORAL_TABLET | Freq: Two times a day (BID) | ORAL | 0 refills | Status: AC

## 2023-04-28 NOTE — Progress Notes (Signed)
Subjective  CC:  Chief Complaint  Patient presents with   Cough    Pt stated that she has had a cough since 04/08/2023    HPI: Megan [name] is a 77 y.o. female who presents to the office today to address the problems listed above in the chief complaint. 77 year old with oxygen dependent COPD, coronary artery disease, hypertension, chronic back pain on chronic narcotics presents due to prolonged cough.  She reports that she has had a cough for about a month.  I have reviewed notes from cardiology and pulmonology.  She has been seen and treated by pulmonology with a Z-Pak, recently changed to cefdinir, prednisone and Hycodan.  She recently requested a refill of Hycodan.  I have reviewed her [facility] drug database.  She is on chronic oxycodone.  She reports she takes this daily.  Her main concern is coughing spasms at times take her breath away.  She also has had a change in her productive sputum, still coughing up yellow-green thick mucus in spite of Mucinex and antibiotics.  Her oxygen levels are stable.  She is wheezing and feels tight in the chest.  She does have albuterol and her chronic COPD inhalers.  She did have a chest x-ray that did not show any active disease.  She recently also had an echocardiogram to workup her shortness of breath.  That was stable without signs of ischemia.  Most of her symptoms are related to her severe pulmonary disease.  She does not feel lightheaded and is not having palpitations.  Her blood pressure is running low.  Assessment  1. COPD with acute exacerbation (HCC)   2. COPD with chronic bronchitis   3. Essential (primary) hypertension   4. Oxygen dependent      Plan  COPD acute exacerbation, severe COPD: We had a long discussion.  Unfortunately, over the last year her pulmonary disease has progressed significantly.  Currently with bronchospasm coughing and change in sputum, will change to ciprofloxacin to cover for Pseudomonas.  Add long prednisone taper as  that typically helps.  Continue albuterol inhalers.  Avoid further narcotic cough suppressants.  Recommend Mucinex DM twice daily.  She will follow-up with me if not improving. She will monitor her blood pressure at home.  Follow up: 2 to 3 weeks if needed Visit date not found  No orders of the defined types were placed in this encounter.  Meds ordered this encounter  Medications   ciprofloxacin (CIPRO) 500 MG tablet    Sig: Take 1 tablet (500 mg total) by mouth 2 (two) times daily for 7 days.    Dispense:  14 tablet    Refill:  0   predniSONE (DELTASONE) 10 MG tablet    Sig: Take 6 tabs qd x 3d, 4 tabs qd x 3d, 3 tabs qd x 3d, 2 tabs qd x 3d, 1 tab qd x 3d    Dispense:  48 tablet    Refill:  0      I reviewed the patients updated PMH, FH, and SocHx.    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    Priority: High   Coronary artery disease  Priority: High   Peripheral vascular disease with claudication (HCC)     Priority: High   History of abdominal aortic aneurysm repair 05/18/2011    Priority: High   Hyperlipidemia LDL goal <70     Priority: High   History of compression fracture of spine - lumbar 06/19/2021    Priority: Medium    History of lumbosacral spine surgery 03/03/2021    Priority: Medium    Neuropathic pain 12/05/2020    Priority: Medium    Gait instability 09/09/2020    Priority: Medium    Lumbar stenosis without neurogenic claudication 02/26/2020    Priority: Medium    DJD (degenerative joint disease), lumbosacral 11/10/2017    Priority: Medium    Fibromyalgia 11/10/2017    Priority: Medium    GERD (gastroesophageal reflux disease)  11/10/2017    Priority: Medium    Fatty liver 11/10/2017    Priority: Medium    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    Priority: Medium    History of renal artery stenosis 05/18/2011    Priority: Medium    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/26/2021    Priority: Low   Dyshidrotic eczema 11/10/2017    Priority: Low   Osteopenia 11/10/2017    Priority: Low   Does use hearing aid 11/10/2017    Priority: Low   Oxygen dependent 04/29/2023   Centrilobular emphysema (HCC) 02/17/2023   Chronic respiratory failure (HCC) 02/17/2023   Pneumonia 02/17/2023   Senile purpura (HCC) 12/22/2022   Daily consumption of alcohol 12/22/2022   S/P lumbar laminectomy 03/24/2022   Thyroid nodule greater than or equal to 1.5 cm in diameter incidentally noted on imaging study 08/27/2021   Angina pectoris (HCC) 06/25/2021   Current Meds  Medication Sig   Accu-Chek FastClix Lancets MISC Check blood sugar once daily PRN   albuterol (PROAIR HFA) 108 (90 Base) MCG/ACT inhaler Inhale 2 puffs into the lungs every 6 (six) hours as needed for wheezing or shortness of breath.   albuterol (PROVENTIL) (2.5 MG/3ML) 0.083% nebulizer solution Take 3 mLs (2.5 mg total) by nebulization every 6 (six) hours as needed for wheezing or shortness of breath.   ALPRAZolam (XANAX) 0.25 MG tablet Take 1 tablet (0.25 mg total) by mouth at bedtime as needed for sleep.   amLODipine (NORVASC) 5 MG tablet Take 1 tablet (5 mg total) by mouth daily.   aspirin EC 81 MG tablet Take 1 tablet (81 mg total) by mouth daily.   atorvastatin (LIPITOR) 80 MG tablet TAKE 1 TABLET BY MOUTH EVERY DAY   B Complex-Biotin-FA (B-100 COMPLEX PO) Take 1 tablet by mouth every evening.   bismuth subsalicylate (PEPTO BISMOL) 262 MG/15ML suspension Take 30 mLs by mouth every 6 (six) hours as needed for indigestion or diarrhea or loose stools.   Budeson-Glycopyrrol-Formoterol (BREZTRI AEROSPHERE) 160-9-4.8 MCG/ACT AERO Inhale 2 puffs into the lungs in the  morning and at bedtime.   cetirizine (ZYRTEC) 10 MG tablet Take 10 mg by mouth daily as needed for allergies.   cholecalciferol (VITAMIN D3) 25 MCG (1000 UNIT) tablet Take 1,000 Units by mouth every evening.   ciprofloxacin (CIPRO) 500 MG tablet Take 1 tablet (500 mg total) by mouth 2 (two) times daily for 7 days.   CORDRAN 4 MCG/SQCM TAPE Apply 1 each topically daily as needed (Rash).    cycloSPORINE (RESTASIS) 0.05 % ophthalmic emulsion Place 1 drop into both eyes 2 (two) times daily.    Ergocalciferol 50 MCG (2000 UT) CAPS Take by mouth.  ezetimibe (ZETIA) 10 MG tablet TAKE 1 TABLET BY MOUTH EVERY DAY   gabapentin (NEURONTIN) 300 MG capsule Take 1 capsule (300 mg total) by mouth 2 (two) times daily.   glucose blood (ACCU-CHEK SMARTVIEW) test strip CHECK BLOOD SUGAR ONCE DAILY AS NEEDED   hydrochlorothiazide (HYDRODIURIL) 12.5 MG tablet TAKE 1 TABLET BY MOUTH EVERY DAY   HYDROcodone bit-homatropine (HYCODAN) 5-1.5 MG/5ML syrup Take 5 mLs by mouth at bedtime as needed for cough.   isosorbide mononitrate (IMDUR) 30 MG 24 hr tablet Take 3 tablets ( 90 mg ) every morning and 1 tablet (30 mg ) every evening (Patient taking differently: Take 30-90 mg by mouth See admin instructions. Take 3 tablets ( 90 mg ) every morning and 1 or 2 tablets (30 - 60 mg ) every evening)   losartan (COZAAR) 100 MG tablet Take 1 tablet (100 mg total) by mouth daily.   metoprolol succinate (TOPROL XL) 25 MG 24 hr tablet Take 1 tablet (25 mg total) by mouth daily.   Multiple Vitamins-Minerals (MULTIVITAMIN WITH MINERALS) tablet Take 1 tablet by mouth every evening.   nitroGLYCERIN (NITROSTAT) 0.4 MG SL tablet Place 1 tablet (0.4 mg total) under the tongue every 5 (five) minutes as needed for chest pain.   omeprazole (PRILOSEC) 40 MG capsule Take 1 capsule (40 mg total) by mouth in the morning and at bedtime.   oxyCODONE-acetaminophen (PERCOCET) 10-325 MG tablet Take 1 tablet by mouth in the morning and at bedtime. Can take  one tablet three times a day as needed for back pain   polyethylene glycol (MIRALAX / GLYCOLAX) 17 g packet Take 17 g by mouth daily as needed for moderate constipation.   prasugrel (EFFIENT) 5 MG TABS tablet TAKE 1 TABLET (5 MG TOTAL) BY MOUTH DAILY.   predniSONE (DELTASONE) 10 MG tablet Take 6 tabs qd x 3d, 4 tabs qd x 3d, 3 tabs qd x 3d, 2 tabs qd x 3d, 1 tab qd x 3d   PRESCRIPTION MEDICATION Place 2 puffs into both ears daily as needed (ear infections). [facility] ear powder   Simethicone (GAS-X PO) Take 1-2 tablets by mouth daily as needed (gas).   torsemide (DEMADEX) 20 MG tablet Take by mouth.   tretinoin (RETIN-A) 0.1 % cream Apply 1 application. topically every evening.   [DISCONTINUED] cefdinir (OMNICEF) 300 MG capsule Take 1 capsule (300 mg total) by mouth 2 (two) times daily.   [DISCONTINUED] cefdinir (OMNICEF) 300 MG capsule Take 300 mg by mouth 2 (two) times daily.    Allergies: Patient is allergic to brilinta [ticagrelor], bextra [valdecoxib], hydrocod poli-chlorphe poli er, lipitor [atorvastatin], mevacor [lovastatin], plavix [clopidogrel bisulfate], zocor [simvastatin], doxycycline, metoprolol, morphine and codeine, codeine, and lisinopril. Family History: Patient family history includes AAA (abdominal aortic aneurysm) in her brother; Breast cancer in her sister; Colon cancer in her cousin and paternal grandmother; Diabetes in her brother and sister; Heart attack in her brother, father, mother, and sister; Heart disease in her brother, brother, father, mother, and sister; Hyperlipidemia in her brother and sister; Hypertension in her brother and sister; Liver cancer in her sister. Social History:  Patient  reports that she quit smoking about 27 years ago. Her smoking use included cigarettes. She has never been exposed to tobacco smoke. She has never used smokeless tobacco. She reports current alcohol use of about 6.0 - 10.0 standard drinks of alcohol per week. She reports that she does  not use drugs.  Review of Systems: Constitutional: Negative for fever malaise or  anorexia Cardiovascular: negative for chest pain Respiratory: negative for SOB or persistent cough Gastrointestinal: negative for abdominal pain  Objective  Vitals: BP (!) 120/46   Pulse 83   Temp 98 F (36.7 C)   Ht 5' (1.524 m)   Wt 130 lb 12.8 oz (59.3 kg)   SpO2 98%   BMI 25.55 kg/m  General: no acute distress , A&Ox3, no respiratory distress except for occasional coughing spell HEENT: PEERL, conjunctiva normal, neck is supple Cardiovascular:  RRR Respiratory: Distant breath sounds with expiratory wheezes bilaterally without rales.  No tachypnea or retractions Skin:  Warm, no rashes  Commons side effects, risks, benefits, and alternatives for medications and treatment plan prescribed today were discussed, and the patient expressed understanding of the given instructions. Patient is instructed to call or message via MyChart if he/she has any questions or concerns regarding our treatment plan. No barriers to understanding were identified. We discussed Red Flag symptoms and signs in detail. Patient expressed understanding regarding what to do in case of urgent or emergency type symptoms.  Medication list was reconciled, printed and provided to the patient in AVS. Patient instructions and summary information was reviewed with the patient as documented in the AVS. This note was prepared with assistance of Dragon voice recognition software. Occasional wrong-word or sound-a-like substitutions may have occurred due to the inherent limitations of voice recognition software

## 2023-04-28 NOTE — Patient Instructions (Signed)
Please return in 2-3 weeks if needed  Monitor your blood pressure and send results to Dr. [place]. Medications may need to be adjusted again.  Change to mucinex DM twice daily.  If you have any questions or concerns, please don't hesitate to send me a message via MyChart or call the office at [phone]. Thank you for visiting with Megan [name] today! It's our pleasure caring for you.

## 2023-04-29 DIAGNOSIS — Z9981 Dependence on supplemental oxygen: Secondary | ICD-10-CM | POA: Insufficient documentation

## 2023-05-06 ENCOUNTER — Encounter: Payer: Self-pay | Admitting: Cardiology

## 2023-05-20 DIAGNOSIS — R531 Weakness: Secondary | ICD-10-CM | POA: Diagnosis not present

## 2023-05-24 ENCOUNTER — Encounter: Payer: Self-pay | Admitting: Family Medicine

## 2023-05-24 ENCOUNTER — Ambulatory Visit (INDEPENDENT_AMBULATORY_CARE_PROVIDER_SITE_OTHER): Payer: Medicare Other | Admitting: Family Medicine

## 2023-05-24 VITALS — BP 104/60 | HR 74 | Temp 98.2°F | Wt 135.8 lb

## 2023-05-24 DIAGNOSIS — J9601 Acute respiratory failure with hypoxia: Secondary | ICD-10-CM | POA: Diagnosis not present

## 2023-05-24 DIAGNOSIS — S2096XA Insect bite (nonvenomous) of unspecified parts of thorax, initial encounter: Secondary | ICD-10-CM

## 2023-05-24 DIAGNOSIS — W57XXXA Bitten or stung by nonvenomous insect and other nonvenomous arthropods, initial encounter: Secondary | ICD-10-CM

## 2023-05-24 DIAGNOSIS — J4489 Other specified chronic obstructive pulmonary disease: Secondary | ICD-10-CM | POA: Diagnosis not present

## 2023-05-24 MED ORDER — DOXYCYCLINE HYCLATE 100 MG PO CAPS: 100.0000 mg | ORAL_CAPSULE | Freq: Two times a day (BID) | ORAL | 0 refills | Status: AC

## 2023-05-24 MED ORDER — ONDANSETRON HCL 8 MG PO TABS: 8.0000 mg | ORAL_TABLET | Freq: Four times a day (QID) | ORAL | 0 refills | Status: AC | PRN

## 2023-05-24 NOTE — Progress Notes (Signed)
   Subjective:    Patient ID: Megan [name], female    DOB: 1946-09-28, 77 y.o.   MRN: [id number]  HPI Here for a tick bite yesterday. She pulled the tick off of her right side. She also developed a bad headache and a stiff neck yesterday. Today her neck is fine and the headache is very slight only. No fever or rashes.    Review of Systems  Constitutional: Negative.   Respiratory: Negative.    Cardiovascular: Negative.   Gastrointestinal: Negative.   Musculoskeletal:  Positive for neck stiffness.  Skin:  Negative for rash.  Neurological:  Positive for headaches.       Objective:   Physical Exam Constitutional:      Appearance: Normal appearance. She is not ill-appearing.  Cardiovascular:     Rate and Rhythm: Normal rate and regular rhythm.     Pulses: Normal pulses.     Heart sounds: Normal heart sounds.  Pulmonary:     Effort: Pulmonary effort is normal.     Breath sounds: Normal breath sounds.  Skin:    Comments: There is a small red bite mark on the right side just below the axilla   Neurological:     General: No focal deficit present.     Mental Status: She is alert and oriented to person, place, and time.           Assessment & Plan:  Symptomatic tick bite. We wil treat empirically with 10 days of Doxycycline. She has had nausea and diarrhea from this in the past, so we will provide her with some Ondansetron to use as needed. She can add Imodium as needed.  Gershon Crane, MD

## 2023-06-02 ENCOUNTER — Ambulatory Visit: Payer: Medicare Other | Admitting: Adult Health

## 2023-06-02 ENCOUNTER — Encounter: Payer: Self-pay | Admitting: Adult Health

## 2023-06-02 VITALS — BP 92/40 | HR 74 | Temp 97.5°F | Ht 59.5 in | Wt 136.6 lb

## 2023-06-02 DIAGNOSIS — J4489 Other specified chronic obstructive pulmonary disease: Secondary | ICD-10-CM | POA: Diagnosis not present

## 2023-06-02 DIAGNOSIS — J439 Emphysema, unspecified: Secondary | ICD-10-CM

## 2023-06-02 DIAGNOSIS — J9611 Chronic respiratory failure with hypoxia: Secondary | ICD-10-CM

## 2023-06-02 MED ORDER — PREDNISONE 20 MG PO TABS
20.0000 mg | ORAL_TABLET | Freq: Every day | ORAL | 0 refills | Status: DC
Start: 2023-06-02 — End: 2023-06-07

## 2023-06-02 MED ORDER — DOXYCYCLINE HYCLATE 100 MG PO TABS
100.0000 mg | ORAL_TABLET | Freq: Two times a day (BID) | ORAL | 0 refills | Status: DC
Start: 2023-06-02 — End: 2023-06-07

## 2023-06-02 NOTE — Assessment & Plan Note (Signed)
Acute exacerbation.  Will extend antibiotics out for additional 4 days.  Short course of prednisone burst with prednisone 20 mg daily.  Patient education on chronic steroid use.  Continue on triple therapy maintenance inhaler.  Refer to pulmonary rehab  Plan  Patient Instructions  Extend Doxycycline for 4 days  Prednisone 20mg  daily for 5 days  Continue on Breztri 2 puffs Twice daily , rinse after use.  Delsym 2 tsp Twice daily  As needed  cough.  Albuterol inhaler As needed   Continue on Oxygen 2l/m at rest and 3l/m with activity -goal O2 sats >88-90% Refer to pulmonary rehab.  Follow up with Dr. [name] in 2-3 months and As needed   Please contact office for sooner follow up if symptoms do not improve or worsen or seek emergency care

## 2023-06-02 NOTE — Patient Instructions (Addendum)
Extend Doxycycline for 4 days  Prednisone 20mg  daily for 5 days  Continue on Breztri 2 puffs Twice daily , rinse after use.  Delsym 2 tsp Twice daily  As needed  cough.  Albuterol inhaler As needed   Continue on Oxygen 2l/m at rest and 3l/m with activity -goal O2 sats >88-90% Refer to pulmonary rehab.  Follow up with Dr. [name] in 2-3 months and As needed   Please contact office for sooner follow up if symptoms do not improve or worsen or seek emergency care

## 2023-06-02 NOTE — Assessment & Plan Note (Addendum)
Encouraged on oxygen compliance.  Continue on oxygen 2 L at rest and 4 L with activity on her pulse device. 2 L of continuous flow at bedtime.

## 2023-06-02 NOTE — Progress Notes (Signed)
@Patient  ID: Megan [name], female    DOB: June 23, 1946, 77 y.o.   MRN: [id number]  Chief Complaint  Patient presents with   Acute Visit    Referring provider: Willow Ora, MD  HPI: 77 yo female former smoker followed for COPD with Emphysema and Chronic Respiratory Failure on O2   TEST/EVENTS :  CT chest February 19, 2023 showed emphysema, consolidation in the right lower lobe, negative PE  06/02/2023 Acute OV : COPD, O2 RF  Patient presents for an acute office visit.  Complains over the last 2 days she has had increased cough congestion and shortness of breath.  She denies any hemoptysis or chest pain.  Recently had a tick bite and has been on doxycycline has 2 additional days left.  Patient has noticed that her oxygen levels drop intermittently.  She is on oxygen at home 2 L with activity and at bedtime.  She does have a portable oxygen concentrator.  Patient says she takes her oxygen off when she feels she does not need it.  We had a long discussion regarding importance of oxygen compliance and potential complications of hypoxemia.  Today in the office she required 2 L of oxygen at rest and 4 L with activity on her portable oxygen concentrator.  Chest x-ray last month showed chronic COPD with no acute process.  Patient lives alone.  Is able to drive.  Does her ADLs. She does try to be active.  But gets winded with minimum activity.   Allergies  Allergen Reactions   Brilinta [Ticagrelor] Shortness Of Breath   Bextra [Valdecoxib] Other (See Comments)    Increased lft's    Hydrocod Poli-Chlorphe Poli Er Rash   Lipitor [Atorvastatin] Other (See Comments)    Leg weakness    Mevacor [Lovastatin] Other (See Comments)    Muscle weakness    Plavix [Clopidogrel Bisulfate] Itching and Other (See Comments)    Hands and feet tingle and itch   Zocor [Simvastatin] Other (See Comments)    Bones hurt    Doxycycline Diarrhea and Nausea And Vomiting   Metoprolol Other (See Comments)    Hair  loss   Morphine And Codeine Itching and Nausea Only   Codeine Itching and Rash    Hyper-active   Lisinopril Cough    Immunization History  Administered Date(s) Administered   Covid-19, Mrna,Vaccine(Spikevax)31yrs and older 02/11/2023   Fluad Quad(high Dose 65+) 07/30/2019, 08/10/2022   Influenza, High Dose Seasonal PF 08/13/2017, 08/26/2018, 09/15/2020   Influenza, Seasonal, Injecte, Preservative Fre 09/21/2014, 09/06/2015, 07/21/2016   Influenza,inj,quad, With Preservative 08/06/2017   Influenza-Unspecified 08/10/2017, 10/13/2021   PFIZER(Purple Top)SARS-COV-2 Vaccination 12/22/2019, 01/12/2020, 09/15/2020   PPD Test 09/13/1979, 03/10/2021   Pfizer Covid-19 Vaccine Bivalent Booster 5yrs & up 10/13/2021   Pneumococcal Conjugate-13 12/31/2016   Pneumococcal Polysaccharide-23 12/16/2014, 08/01/2019   Respiratory Syncytial Virus Vaccine,Recomb Aduvanted(Arexvy) 10/06/2022   Td 05/13/2008   Td (Adult), 2 Lf Tetanus Toxid, Preservative Free 05/13/2008   Tdap 08/09/2012   Zoster Recombinat (Shingrix) 01/13/2018, 03/14/2018   Zoster, Live 11/03/2010    Past Medical History:  Diagnosis Date   AAA (abdominal aortic aneurysm) (HCC)    a. s/p stent grafting in 2009.   Adenomatous colon polyp 05/2001   Adenomatous duodenal polyp    Allergy    Anginal pain (HCC)    COPD (chronic obstructive pulmonary disease) (HCC)    pt denies COPD   Coronary artery disease    a. s/p CABG in 1995;  b. 05/2013 Neg  MV, EF 82%;  c. 06/2013 Cath: LM nl, LAD 40-50p, LCX nl, RCA 80p/125m, VG->RCA->PDA->PLSA min irregs, LIMA->LAD atretic, vigorous LV fxn.   Diabetes mellitus without complication (HCC)    history of, resolved. 2017   Diverticulosis    Eczema, dyshidrotic    Eczema, dyshidrotic 1986   Dr. Gerilyn Nestle   Emphysema of lung [facility])    Fatty liver    Fibromyalgia    GERD (gastroesophageal reflux disease)    Hyperlipidemia    Hypertension    Migraine    Myocardial infarction [facility])     Osteoarthritis    Osteoporosis    unsure, having a bone scan soon   Peripheral arterial disease (HCC)    left leg diagnosed by Dr Patty Sermons   Renal artery stenosis [facility])    a. 2008 s/p PTA    Tobacco History: Social History   Tobacco Use  Smoking Status Former   Packs/day: 0.00   Years: 37.00   Additional pack years: 0.00   Total pack years: 0.00   Types: Cigarettes   Quit date: 12/07/1995   Years since quitting: 27.5   Passive exposure: Never  Smokeless Tobacco Never   Counseling given: Not Answered   Outpatient Medications Prior to Visit  Medication Sig Dispense Refill   Accu-Chek FastClix Lancets MISC Check blood sugar once daily PRN 100 each 1   albuterol (PROAIR HFA) 108 (90 Base) MCG/ACT inhaler Inhale 2 puffs into the lungs every 6 (six) hours as needed for wheezing or shortness of breath. 1 each 11   albuterol (PROVENTIL) (2.5 MG/3ML) 0.083% nebulizer solution Take 3 mLs (2.5 mg total) by nebulization every 6 (six) hours as needed for wheezing or shortness of breath. 360 mL 5   ALPRAZolam (XANAX) 0.25 MG tablet Take 1 tablet (0.25 mg total) by mouth at bedtime as needed for sleep. 90 tablet 3   amLODipine (NORVASC) 5 MG tablet Take 1 tablet (5 mg total) by mouth daily. 90 tablet 3   aspirin EC 81 MG tablet Take 1 tablet (81 mg total) by mouth daily. 90 tablet 3   atorvastatin (LIPITOR) 80 MG tablet TAKE 1 TABLET BY MOUTH EVERY DAY 90 tablet 3   B Complex-Biotin-FA (B-100 COMPLEX PO) Take 1 tablet by mouth every evening.     bismuth subsalicylate (PEPTO BISMOL) 262 MG/15ML suspension Take 30 mLs by mouth every 6 (six) hours as needed for indigestion or diarrhea or loose stools.     Budeson-Glycopyrrol-Formoterol (BREZTRI AEROSPHERE) 160-9-4.8 MCG/ACT AERO Inhale 2 puffs into the lungs in the morning and at bedtime. 1 each 11   cetirizine (ZYRTEC) 10 MG tablet Take 10 mg by mouth daily as needed for allergies.     cholecalciferol (VITAMIN D3) 25 MCG (1000 UNIT) tablet Take  1,000 Units by mouth every evening.     CORDRAN 4 MCG/SQCM TAPE Apply 1 each topically daily as needed (Rash).      cycloSPORINE (RESTASIS) 0.05 % ophthalmic emulsion Place 1 drop into both eyes 2 (two) times daily.      doxycycline (VIBRAMYCIN) 100 MG capsule Take 1 capsule (100 mg total) by mouth 2 (two) times daily for 10 days. 20 capsule 0   Ergocalciferol 50 MCG (2000 UT) CAPS Take by mouth.     ezetimibe (ZETIA) 10 MG tablet TAKE 1 TABLET BY MOUTH EVERY DAY 90 tablet 3   gabapentin (NEURONTIN) 300 MG capsule Take 1 capsule (300 mg total) by mouth 2 (two) times daily. 270 capsule 3  glucose blood (ACCU-CHEK SMARTVIEW) test strip CHECK BLOOD SUGAR ONCE DAILY AS NEEDED 100 strip 1   hydrochlorothiazide (HYDRODIURIL) 12.5 MG tablet TAKE 1 TABLET BY MOUTH EVERY DAY 90 tablet 3   HYDROcodone bit-homatropine (HYCODAN) 5-1.5 MG/5ML syrup Take 5 mLs by mouth at bedtime as needed for cough. 120 mL 0   isosorbide mononitrate (IMDUR) 30 MG 24 hr tablet Take 3 tablets ( 90 mg ) every morning and 1 tablet (30 mg ) every evening (Patient taking differently: Take 30-90 mg by mouth See admin instructions. Take 3 tablets ( 90 mg ) every morning and 1 or 2 tablets (30 - 60 mg ) every evening) 360 tablet 3   losartan (COZAAR) 100 MG tablet Take 1 tablet (100 mg total) by mouth daily. 90 tablet 3   metoprolol succinate (TOPROL XL) 25 MG 24 hr tablet Take 1 tablet (25 mg total) by mouth daily. 90 tablet 3   Multiple Vitamins-Minerals (MULTIVITAMIN WITH MINERALS) tablet Take 1 tablet by mouth every evening.     nitroGLYCERIN (NITROSTAT) 0.4 MG SL tablet Place 1 tablet (0.4 mg total) under the tongue every 5 (five) minutes as needed for chest pain. 25 tablet 11   omeprazole (PRILOSEC) 40 MG capsule Take 1 capsule (40 mg total) by mouth in the morning and at bedtime. 180 capsule 3   ondansetron (ZOFRAN) 8 MG tablet Take 1 tablet (8 mg total) by mouth every 6 (six) hours as needed for nausea or vomiting. 20 tablet 0    oxyCODONE-acetaminophen (PERCOCET) 10-325 MG tablet Take 1 tablet by mouth in the morning and at bedtime. Can take one tablet three times a day as needed for back pain     polyethylene glycol (MIRALAX / GLYCOLAX) 17 g packet Take 17 g by mouth daily as needed for moderate constipation.     prasugrel (EFFIENT) 5 MG TABS tablet TAKE 1 TABLET (5 MG TOTAL) BY MOUTH DAILY. 90 tablet 3   predniSONE (DELTASONE) 10 MG tablet Take 6 tabs qd x 3d, 4 tabs qd x 3d, 3 tabs qd x 3d, 2 tabs qd x 3d, 1 tab qd x 3d 48 tablet 0   PRESCRIPTION MEDICATION Place 2 puffs into both ears daily as needed (ear infections). [facility] ear powder     Simethicone (GAS-X PO) Take 1-2 tablets by mouth daily as needed (gas).     torsemide (DEMADEX) 20 MG tablet Take by mouth.     tretinoin (RETIN-A) 0.1 % cream Apply 1 application. topically every evening.     No facility-administered medications prior to visit.     Review of Systems:   Constitutional:   No  weight loss, night sweats,  Fevers, chills,  +fatigue, or  lassitude.  HEENT:   No headaches,  Difficulty swallowing,  Tooth/dental problems, or  Sore throat,                No sneezing, itching, ear ache, nasal congestion, post nasal drip,   CV:  No chest pain,  Orthopnea, PND, swelling in lower extremities, anasarca, dizziness, palpitations, syncope.   GI  No heartburn, indigestion, abdominal pain, nausea, vomiting, diarrhea, change in bowel habits, loss of appetite, bloody stools.   Resp: .  No chest wall deformity  Skin: no rash or lesions.  GU: no dysuria, change in color of urine, no urgency or frequency.  No flank pain, no hematuria   MS:  No joint pain or swelling.  No decreased range of motion.  No back pain.  Physical Exam  BP (!) 92/40 (BP Location: Left Arm, Patient Position: Sitting, Cuff Size: Normal)   Pulse 74   Temp (!) 97.5 F (36.4 C) (Oral)   Ht 4' 11.5" (1.511 m)   Wt 136 lb 9.6 oz (62 kg)   SpO2 92%   BMI 27.13 kg/m   GEN:  A/Ox3; pleasant , NAD, well nourished, oxygen   HEENT:  [place]/AT,   NOSE-clear, THROAT-clear, no lesions, no postnasal drip or exudate noted.   NECK:  Supple w/ fair ROM; no JVD; normal carotid impulses w/o bruits; no thyromegaly or nodules palpated; no lymphadenopathy.    RESP  Clear  P & A; w/o, wheezes/ rales/ or rhonchi. no accessory muscle use, no dullness to percussion  CARD:  RRR, no m/r/g, tr peripheral edema, pulses intact, no cyanosis or clubbing.  GI:   Soft & nt; nml bowel sounds; no organomegaly or masses detected.   Musco: Warm bil, no deformities or joint swelling noted.   Neuro: alert, no focal deficits noted.    Skin: Warm, no lesions or rashes    Lab Results:      BNP    Imaging: No results found.       Latest Ref Rng & Units 03/03/2023    1:20 PM  PFT Results  FVC-Pre L 1.85   FVC-Predicted Pre % 83   FVC-Post L 1.84   FVC-Predicted Post % 82   Pre FEV1/FVC % % 76   Post FEV1/FCV % % 76   FEV1-Pre L 1.41   FEV1-Predicted Pre % 85   FEV1-Post L 1.39   DLCO uncorrected ml/min/mmHg 9.07   DLCO UNC% % 55   DLCO corrected ml/min/mmHg 8.81   DLCO COR %Predicted % 53   DLVA Predicted % 67   TLC L 3.82   TLC % Predicted % 85   RV % Predicted % 92     No results found for: "NITRICOXIDE"      Assessment & Plan:   COPD with chronic bronchitis (HCC) Acute exacerbation.  Will extend antibiotics out for additional 4 days.  Short course of prednisone burst with prednisone 20 mg daily.  Patient education on chronic steroid use.  Continue on triple therapy maintenance inhaler.  Refer to pulmonary rehab  Plan  Patient Instructions  Extend Doxycycline for 4 days  Prednisone 20mg  daily for 5 days  Continue on Breztri 2 puffs Twice daily , rinse after use.  Delsym 2 tsp Twice daily  As needed  cough.  Albuterol inhaler As needed   Continue on Oxygen 2l/m at rest and 3l/m with activity -goal O2 sats >88-90% Refer to pulmonary rehab.  Follow up  with Dr. [name] in 2-3 months and As needed   Please contact office for sooner follow up if symptoms do not improve or worsen or seek emergency care      Chronic respiratory failure (HCC) Encouraged on oxygen compliance.  Continue on oxygen 2 L at rest and 4 L with activity on her pulse device. 2 L of continuous flow at bedtime.     Rubye Oaks, NP 06/02/2023

## 2023-06-05 ENCOUNTER — Other Ambulatory Visit: Payer: Self-pay | Admitting: Family Medicine

## 2023-06-06 ENCOUNTER — Telehealth: Payer: Self-pay | Admitting: Family Medicine

## 2023-06-06 ENCOUNTER — Telehealth (HOSPITAL_COMMUNITY): Payer: Self-pay

## 2023-06-06 NOTE — Telephone Encounter (Signed)
Called spoke w/ pt she said that she was getting her inhaler though patient assist the former pharmacist had arranged this for her. She stated that she only has one left and she didn't want run out. And wanted to get contact with some that get this set up for again,or she okay does it just needs to be reorder. I told I would let the the new pharmacist know. Someone will reach out to her regarding this. Sent a Teams to [employer] the Teacher, early years/pre.

## 2023-06-06 NOTE — Telephone Encounter (Signed)
Patient called requesting to speak with Willa Frater, our former pharmacist. States he was helping her get a certain medication. Pt is requesting someone who could help her in this regard. She can be reached @ [phone].

## 2023-06-06 NOTE — Telephone Encounter (Signed)
Received referral from Dr. [name] for this pt to participate in Pulmonary Rehab with the diagnosis of Pulmonary Emphysema. Clinical review of pt follow up appt on 06/02/2023 Pulmonary office note. Pt appropriate for scheduling for Pulmonary rehab. Will forward to support staff for scheduling and verification of insurance eligibility/benefits with pt consent.   Megan Hart, RN, BSN Cardiac and Pulmonary Rehab

## 2023-06-06 NOTE — Telephone Encounter (Signed)
I have a patient at [facility]  , that  is getting her inhauler though patient assit. and she has one left she wanted to know if she need to re apply though you. she said that [place] had arrange this for her and she wasn't   on what she need to do.

## 2023-06-07 ENCOUNTER — Other Ambulatory Visit: Payer: Medicare Other | Admitting: Pharmacist

## 2023-06-07 ENCOUNTER — Encounter: Payer: Self-pay | Admitting: Pharmacist

## 2023-06-07 NOTE — Progress Notes (Signed)
06/07/2023 Name: Megan [name] MRN: [id number] DOB: 1946/11/28  Chief Complaint  Patient presents with   Medication Management    Breztri    Megan [name] is a 77 y.o. year old female who presented for a telephone visit.   They were referred to the pharmacist by  self referral / previously engaged with [facility] team  for assistance in managing medication access.    Subjective:  Medication Access/Adherence  Current Pharmacy:  [id number] [name], [place] - [address] [place] [address] Phone: [phone] Fax: [phone]  [facility] [place], [place] - [address] [place] [address] Phone: [phone] Fax: [phone]  [id number] [place] [name], [place] - [address] [place] [address] Phone: [phone] Fax: [phone]   Patient reports affordability concerns with their medications: Yes  - Breztri inhaler Patient reports access/transportation concerns to their pharmacy: No  Patient reports adherence concerns with their medications:  No      Medication Management:  Receiving Breztri inhaler from [facility] program but she has not received a delivery in awhile. She just started to last inhaler.    Patient reports Good adherence to medications  Patient reports the following barriers to adherence:  Complex medication regimen Medication cost.    Objective:  Lab Results  Component Value Date   HGBA1C 6.1 12/22/2022    Lab Results  Component Value Date   CREATININE 1.16 02/17/2023   BUN 19 02/17/2023   NA 138 02/17/2023   K 4.1 02/17/2023   CL 103 02/17/2023   CO2 25 02/17/2023    Lab Results  Component Value Date   CHOL 148 12/22/2022   HDL 63.60 12/22/2022   LDLCALC 58 12/22/2022   LDLDIRECT 82.0 06/13/2020   TRIG 130.0 12/22/2022   CHOLHDL 2 12/22/2022    Medications Reviewed Today     Reviewed by Henrene Pastor, RPH-CPP (Pharmacist) on  06/07/23 at 1624  Med List Status: <None>   Medication Order Taking? Sig Documenting Provider Last Dose Status Informant  Accu-Chek FastClix Lancets MISC [id number]  Check blood sugar once daily PRN Willow Ora, MD  Active Self           Med Note Cyndie Chime, ANH T   Tue Jan 06, 2021  1:20 PM)    albuterol (PROAIR HFA) 108 (90 Base) MCG/ACT inhaler [id number]  Inhale 2 puffs into the lungs every 6 (six) hours as needed for wheezing or shortness of breath. Hunsucker, Lesia Sago, MD  Active   albuterol (PROVENTIL) (2.5 MG/3ML) 0.083% nebulizer solution [id number]  Take 3 mLs (2.5 mg total) by nebulization every 6 (six) hours as needed for wheezing or shortness of breath. Glenford Bayley, NP  Active   ALPRAZolam Prudy Feeler) 0.25 MG tablet [id number]  Take 1 tablet (0.25 mg total) by mouth at bedtime as needed for sleep. [place], Peter M, MD  Active Self  amLODipine (NORVASC) 5 MG tablet [id number]  Take 1 tablet (5 mg total) by mouth daily. [place], Peter M, MD  Active   aspirin EC 81 MG tablet [id number]  Take 1 tablet (81 mg total) by mouth daily. [place], Peter M, MD  Active Self  atorvastatin (LIPITOR) 80 MG tablet [id number]  TAKE 1 TABLET BY MOUTH EVERY DAY [place], Peter M, MD  Active   B Complex-Biotin-FA (B-100 COMPLEX PO) [id number]  Take 1 tablet by mouth every evening. [provider]  Active Self  bismuth subsalicylate (PEPTO BISMOL) 262 MG/15ML suspension [id number]  Take 30 mLs by mouth every 6 (six) hours as needed for indigestion or diarrhea or loose stools. [provider]  Active Self  Budeson-Glycopyrrol-Formoterol (BREZTRI AEROSPHERE) 160-9-4.8 MCG/ACT AERO [id number]  Inhale 2 puffs into the lungs in the morning and at bedtime. Hunsucker, Lesia Sago, MD  Active   cetirizine (ZYRTEC) 10 MG tablet [id number]  Take 10 mg by mouth daily as needed for allergies. [provider]  Active Self  cholecalciferol (VITAMIN D3) 25 MCG (1000 UNIT) tablet [id number]  Take 1,000 Units by  mouth every evening. [provider]  Active Self  CORDRAN 4 MCG/SQCM TAPE [id number]  Apply 1 each topically daily as needed (Rash).  [provider]  Active Self  cycloSPORINE (RESTASIS) 0.05 % ophthalmic emulsion [id number]  Place 1 drop into both eyes 2 (two) times daily.  [provider]  Active Self  Ergocalciferol 50 MCG (2000 UT) CAPS [id number]  Take by mouth. [provider]  Active   ezetimibe (ZETIA) 10 MG tablet [id number]  TAKE 1 TABLET BY MOUTH EVERY DAY [place], Peter M, MD  Active   gabapentin (NEURONTIN) 300 MG capsule [id number]  Take 1 capsule (300 mg total) by mouth 2 (two) times daily. Penumalli, Glenford Bayley, MD  Active   glucose blood (ACCU-CHEK SMARTVIEW) test strip [id number]  CHECK BLOOD SUGAR ONCE DAILY AS NEEDED Willow Ora, MD  Active Self  hydrochlorothiazide (HYDRODIURIL) 12.5 MG tablet [id number]  TAKE 1 TABLET BY MOUTH EVERY DAY [place], Peter M, MD  Active   isosorbide mononitrate (IMDUR) 30 MG 24 hr tablet [id number]  Take 3 tablets ( 90 mg ) every morning and 1 tablet (30 mg ) every evening  Patient taking differently: Take 30-90 mg by mouth See admin instructions. Take 3 tablets ( 90 mg ) every morning and 1 or 2 tablets (30 - 60 mg ) every evening   Alver Sorrow, NP  Active Self  losartan (COZAAR) 100 MG tablet [id number]  TAKE 1 TABLET BY MOUTH EVERY DAY Willow Ora, MD  Active   metoprolol succinate (TOPROL XL) 25 MG 24 hr tablet [id number]  Take 1 tablet (25 mg total) by mouth daily. [place], Peter M, MD  Active Self  Multiple Vitamins-Minerals (MULTIVITAMIN WITH MINERALS) tablet [id number]  Take 1 tablet by mouth every evening. [provider]  Active Self  nitroGLYCERIN (NITROSTAT) 0.4 MG SL tablet [id number]  Place 1 tablet (0.4 mg total) under the tongue every 5 (five) minutes as needed for chest pain. [place], Peter M, MD  Active   omeprazole (PRILOSEC) 40 MG capsule [id number]  Take 1 capsule (40 mg total) by mouth in  the morning and at bedtime. Meryl Dare, MD  Active Self  ondansetron [facility]) 8 MG tablet [id number]  Take 1 tablet (8 mg total) by mouth every 6 (six) hours as needed for nausea or vomiting. Nelwyn Salisbury, MD  Active   oxyCODONE-acetaminophen (PERCOCET) 10-325 MG tablet [id number]  Take 1 tablet by mouth in the morning and at bedtime. Can take one tablet three times a day as needed for back pain [provider]  Active Self  polyethylene glycol (MIRALAX / GLYCOLAX) 17 g packet [id number]  Take 17 g by mouth daily as needed for moderate constipation. [provider]  Active Self  prasugrel (EFFIENT) 5 MG TABS tablet [id number]  TAKE 1 TABLET (5 MG TOTAL) BY MOUTH DAILY. [place], Peter M, MD  Active   PRESCRIPTION MEDICATION [id number]  Place 2 puffs into both ears daily as needed (ear infections). [facility]SA[facility] ear powder [provider]  Active Self  Simethicone (GAS-X PO) [id number]  Take 1-2 tablets by mouth daily as needed (gas). [provider]  Active Self  torsemide (DEMADEX) 20 MG tablet [id number]  Take by mouth. [provider]  Active   tretinoin (RETIN-A) 0.1 % cream [id number]  Apply 1 application. topically every evening. [provider]  Active Self              Assessment/Plan:   Medication Management: - Reviewed current medication list and updated.  - [place] with [place] to request Breztri inhaler refill and shipment to patient.  - Provided patient with my contact information incase she has any other quesitons about medications in future.     Follow Up Plan: end of year 2024 to review eligibility for [place] for 2025.   Henrene Pastor, PharmD Clinical Pharmacist [facility]

## 2023-06-07 NOTE — Telephone Encounter (Signed)
See phone outreach - called patient and [place] [employer] [place]. Ordered Breztri from [place] [employer] [place]. Patient should received in about 7 to 10 business days. She is approved for [employer] thru [place] [employer] [place] thru 12/06/2023

## 2023-06-15 ENCOUNTER — Encounter: Payer: Self-pay | Admitting: Family Medicine

## 2023-06-15 ENCOUNTER — Ambulatory Visit (INDEPENDENT_AMBULATORY_CARE_PROVIDER_SITE_OTHER): Payer: Medicare Other | Admitting: Family Medicine

## 2023-06-15 ENCOUNTER — Telehealth (HOSPITAL_COMMUNITY): Payer: Self-pay

## 2023-06-15 VITALS — BP 100/40 | HR 85 | Temp 98.4°F | Ht 59.5 in | Wt 133.6 lb

## 2023-06-15 DIAGNOSIS — D692 Other nonthrombocytopenic purpura: Secondary | ICD-10-CM | POA: Diagnosis not present

## 2023-06-15 DIAGNOSIS — S81809A Unspecified open wound, unspecified lower leg, initial encounter: Secondary | ICD-10-CM

## 2023-06-15 DIAGNOSIS — J4489 Other specified chronic obstructive pulmonary disease: Secondary | ICD-10-CM | POA: Diagnosis not present

## 2023-06-15 DIAGNOSIS — T148XXA Other injury of unspecified body region, initial encounter: Secondary | ICD-10-CM | POA: Diagnosis not present

## 2023-06-15 DIAGNOSIS — J9611 Chronic respiratory failure with hypoxia: Secondary | ICD-10-CM | POA: Diagnosis not present

## 2023-06-15 NOTE — Progress Notes (Signed)
Subjective  CC:  Chief Complaint  Patient presents with   Bleeding/Bruising    Pt stated that she hit her Lt chin bone on the edge of the car door and it bruised her leg and then she fell and hurt her Rt knee which left a bruise as well    HPI: Megan [name] is a 77 y.o. female who presents to the office today to address the problems listed above in the chief complaint. 2 weeks ago, hit lower left shin on car door: bleeding wound. Then did the same a week ago. Then tripped over oxygen cord last week and landed on knees. Has questions about wounds. Last week, bled a lot. Wanted to remove bandages here today incase bleeding was a problem. Some soreness. No fevers. Copd on chronic oxygen: wheezing this am with thick mucous.    Assessment  1. Multiple opens wound of lower extremity, unspecified laterality, initial encounter   2. Hematoma and contusion   3. COPD with chronic bronchitis   4. Senile purpura (HCC)   5. Chronic respiratory failure with hypoxia (HCC)      Plan  Wounds due to falls/contusions:  wound care discussed. No sign of infection. No bleeding now. Discuss use of bandages for protection and time.  Discussed fall prevention Copd: restart mucinex and monitor for signs of infection. Using nebulizer now as well. Likely triggered by humidity Senile purpura: skin care/protection discussed  Follow up: as scheduled. Visit date not found  No orders of the defined types were placed in this encounter.  No orders of the defined types were placed in this encounter.     I reviewed the patients updated PMH, FH, and SocHx.    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    Priority: High   Coronary artery disease     Priority: High   Peripheral vascular disease with claudication (HCC)     Priority: High   History of abdominal aortic aneurysm repair 05/18/2011    Priority: High   Hyperlipidemia LDL goal <70     Priority: High   History of compression fracture of spine - lumbar 06/19/2021    Priority: Medium    History of lumbosacral spine surgery 03/03/2021    Priority: Medium    Neuropathic pain 12/05/2020    Priority: Medium    Gait instability 09/09/2020    Priority: Medium    Lumbar stenosis without neurogenic claudication 02/26/2020    Priority: Medium    DJD (degenerative joint disease), lumbosacral 11/10/2017    Priority: Medium    Fibromyalgia 11/10/2017    Priority: Medium    GERD (gastroesophageal reflux disease) 11/10/2017    Priority: Medium    Fatty liver 11/10/2017    Priority: Medium    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    Priority: Medium    History of renal artery stenosis 05/18/2011    Priority: Medium    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/26/2021    Priority: Low   Dyshidrotic eczema 11/10/2017    Priority: Low   Osteopenia 11/10/2017    Priority: Low   Does use hearing aid 11/10/2017    Priority: Low  Oxygen dependent 04/29/2023   Centrilobular emphysema (HCC) 02/17/2023   Chronic respiratory failure (HCC) 02/17/2023   Pneumonia 02/17/2023   Senile purpura (HCC) 12/22/2022   Daily consumption of alcohol 12/22/2022   S/P lumbar laminectomy 03/24/2022   Thyroid nodule greater than or equal to 1.5 cm in diameter incidentally noted on imaging study 08/27/2021   Angina pectoris (HCC) 06/25/2021   No outpatient medications have been marked as taking for the 06/15/23 encounter (Office Visit) with Willow Ora, MD.    Allergies: Patient is allergic to brilinta [ticagrelor], bextra [valdecoxib], hydrocod poli-chlorphe poli er, lipitor [atorvastatin], mevacor [lovastatin], plavix [clopidogrel  bisulfate], zocor [simvastatin], doxycycline, metoprolol, morphine and codeine, codeine, and lisinopril. Family History: Patient family history includes AAA (abdominal aortic aneurysm) in her brother; Breast cancer in her sister; Colon cancer in her cousin and paternal grandmother; Diabetes in her brother and sister; Heart attack in her brother, father, mother, and sister; Heart disease in her brother, brother, father, mother, and sister; Hyperlipidemia in her brother and sister; Hypertension in her brother and sister; Liver cancer in her sister. Social History:  Patient  reports that she quit smoking about 27 years ago. Her smoking use included cigarettes. She has never been exposed to tobacco smoke. She has never used smokeless tobacco. She reports current alcohol use of about 6.0 - 10.0 standard drinks of alcohol per week. She reports that she does not use drugs.  Review of Systems: Constitutional: Negative for fever malaise or anorexia Cardiovascular: negative for chest pain Respiratory: negative for SOB or persistent cough Gastrointestinal: negative for abdominal pain  Objective  Vitals: BP (!) 100/40   Pulse 85   Temp 98.4 F (36.9 C)   Ht 4' 11.5" (1.511 m)   Wt 133 lb 9.6 oz (60.6 kg)   SpO2 92%   BMI 26.53 kg/m  General: no acute distress , A&Ox3 Respiratory:  fair breath sounds: inspiratory wheeze noted. No rales.  Skin:  thin, purpura throughout Left shin: 2 wound/hematoma and ecchymosis present w/o warmth or drainage Right knee with scrape and ecchymosis  Commons side effects, risks, benefits, and alternatives for medications and treatment plan prescribed today were discussed, and the patient expressed understanding of the given instructions. Patient is instructed to call or message via MyChart if he/she has any questions or concerns regarding our treatment plan. No barriers to understanding were identified. We discussed Red Flag symptoms and signs in detail. Patient expressed  understanding regarding what to do in case of urgent or emergency type symptoms.  Medication list was reconciled, printed and provided to the patient in AVS. Patient instructions and summary information was reviewed with the patient as documented in the AVS. This note was prepared with assistance of Dragon voice recognition software. Occasional wrong-word or sound-a-like substitutions may have occurred due to the inherent limitations of voice recognition software

## 2023-06-15 NOTE — Telephone Encounter (Signed)
Attempted to call patient in regards to scheduling orientation for Pulmonary Rehab - LM on VM

## 2023-06-18 ENCOUNTER — Other Ambulatory Visit (HOSPITAL_BASED_OUTPATIENT_CLINIC_OR_DEPARTMENT_OTHER): Payer: Self-pay | Admitting: Family

## 2023-06-18 DIAGNOSIS — I25118 Atherosclerotic heart disease of native coronary artery with other forms of angina pectoris: Secondary | ICD-10-CM

## 2023-06-19 DIAGNOSIS — R531 Weakness: Secondary | ICD-10-CM | POA: Diagnosis not present

## 2023-06-20 NOTE — Telephone Encounter (Signed)
Rx(s) sent to pharmacy electronically.  

## 2023-06-21 ENCOUNTER — Telehealth: Payer: Self-pay | Admitting: Pharmacist

## 2023-06-21 NOTE — Telephone Encounter (Signed)
Patient called to notify office that she received 3 Breztri inhalers yesterday 06/20/2023 from the [facility] program.  Next delivery should be around 09/20/2023. Patient to call if any questions or problems. [phone]

## 2023-06-22 ENCOUNTER — Encounter (HOSPITAL_COMMUNITY)
Admission: RE | Admit: 2023-06-22 | Discharge: 2023-06-22 | Disposition: A | Discharge status: Home / Self Care | Payer: Medicare Other | Source: Ambulatory Visit | Attending: Pulmonary Disease | Admitting: Pulmonary Disease

## 2023-06-22 ENCOUNTER — Telehealth: Payer: Self-pay | Admitting: Pulmonary Disease

## 2023-06-22 ENCOUNTER — Encounter (HOSPITAL_COMMUNITY): Payer: Self-pay

## 2023-06-22 ENCOUNTER — Other Ambulatory Visit: Payer: Self-pay | Admitting: Cardiology

## 2023-06-22 VITALS — BP 102/52 | HR 83 | Wt 133.6 lb

## 2023-06-22 DIAGNOSIS — J432 Centrilobular emphysema: Secondary | ICD-10-CM

## 2023-06-22 DIAGNOSIS — J439 Emphysema, unspecified: Secondary | ICD-10-CM | POA: Diagnosis not present

## 2023-06-22 NOTE — Progress Notes (Signed)
Pulmonary Individual Treatment Plan  Patient Details  Name: Megan [name] MRN: [id number] Date of Birth: [birth year] Referring Provider:   Doristine Devoid Pulmonary Rehab Walk Test from 06/22/2023 in [facility]  Referring Provider Hunsucker       Initial Encounter Date:  Flowsheet Row Pulmonary Rehab Walk Test from 06/22/2023 in [facility]  Date 06/22/23       Visit Diagnosis: Pulmonary emphysema, unspecified emphysema type (HCC)  Patient's Home Medications on Admission:   Current Outpatient Medications:    Accu-Chek FastClix Lancets MISC, Check blood sugar once daily PRN, Disp: 100 each, Rfl: 1   albuterol (PROAIR HFA) 108 (90 Base) MCG/ACT inhaler, Inhale 2 puffs into the lungs every 6 (six) hours as needed for wheezing or shortness of breath., Disp: 1 each, Rfl: 11   albuterol (PROVENTIL) (2.5 MG/3ML) 0.083% nebulizer solution, Take 3 mLs (2.5 mg total) by nebulization every 6 (six) hours as needed for wheezing or shortness of breath., Disp: 360 mL, Rfl: 5   ALPRAZolam (XANAX) 0.25 MG tablet, Take 1 tablet (0.25 mg total) by mouth at bedtime as needed for sleep., Disp: 90 tablet, Rfl: 3   amLODipine (NORVASC) 5 MG tablet, Take 1 tablet (5 mg total) by mouth daily., Disp: 90 tablet, Rfl: 3   aspirin EC 81 MG tablet, Take 1 tablet (81 mg total) by mouth daily., Disp: 90 tablet, Rfl: 3   atorvastatin (LIPITOR) 80 MG tablet, TAKE 1 TABLET BY MOUTH EVERY DAY, Disp: 90 tablet, Rfl: 3   B Complex-Biotin-FA (B-100 COMPLEX PO), Take 1 tablet by mouth every evening., Disp: , Rfl:    bismuth subsalicylate (PEPTO BISMOL) 262 MG/15ML suspension, Take 30 mLs by mouth every 6 (six) hours as needed for indigestion or diarrhea or loose stools., Disp: , Rfl:    Budeson-Glycopyrrol-Formoterol (BREZTRI AEROSPHERE) 160-9-4.8 MCG/ACT AERO, Inhale 2 puffs into the lungs in the morning and  at bedtime., Disp: 1 each, Rfl: 11   cetirizine (ZYRTEC) 10 MG tablet, Take 10 mg by mouth daily as needed for allergies., Disp: , Rfl:    cholecalciferol (VITAMIN D3) 25 MCG (1000 UNIT) tablet, Take 1,000 Units by mouth every evening., Disp: , Rfl:    CORDRAN 4 MCG/SQCM TAPE, Apply 1 each topically daily as needed (Rash). , Disp: , Rfl:    cycloSPORINE (RESTASIS) 0.05 % ophthalmic emulsion, Place 1 drop into both eyes 2 (two) times daily. , Disp: , Rfl:    Ergocalciferol 50 MCG (2000 UT) CAPS, Take by mouth., Disp: , Rfl:    ezetimibe (ZETIA) 10 MG tablet, TAKE 1 TABLET BY MOUTH EVERY DAY, Disp: 90 tablet, Rfl: 3   gabapentin (NEURONTIN) 300 MG capsule, Take 1 capsule (300 mg total) by mouth 2 (two) times daily., Disp: 270 capsule, Rfl: 3   glucose blood (ACCU-CHEK SMARTVIEW) test strip, CHECK BLOOD SUGAR ONCE DAILY AS NEEDED, Disp: 100 strip, Rfl: 1   hydrochlorothiazide (HYDRODIURIL) 12.5 MG tablet, TAKE 1 TABLET BY MOUTH EVERY DAY, Disp: 90 tablet, Rfl: 3   isosorbide mononitrate (IMDUR) 30 MG 24 hr tablet, TAKE 3 TABLETS ( 90 MG ) EVERY MORNING AND 1 TABLET (30 MG ) EVERY EVENING, Disp: 360 tablet, Rfl: 1   losartan (COZAAR) 100 MG tablet, TAKE 1 TABLET BY MOUTH EVERY DAY, Disp: 90 tablet, Rfl: 3   metoprolol succinate (TOPROL XL) 25 MG 24 hr tablet, Take 1 tablet (25 mg total)  by mouth daily., Disp: 90 tablet, Rfl: 3   Multiple Vitamins-Minerals (MULTIVITAMIN WITH MINERALS) tablet, Take 1 tablet by mouth every evening., Disp: , Rfl:    nitroGLYCERIN (NITROSTAT) 0.4 MG SL tablet, Place 1 tablet (0.4 mg total) under the tongue every 5 (five) minutes as needed for chest pain., Disp: 25 tablet, Rfl: 11   omeprazole (PRILOSEC) 40 MG capsule, Take 1 capsule (40 mg total) by mouth in the morning and at bedtime., Disp: 180 capsule, Rfl: 3   ondansetron (ZOFRAN) 8 MG tablet, Take 1 tablet (8 mg total) by mouth every 6 (six) hours as needed for nausea or vomiting., Disp: 20 tablet, Rfl: 0    oxyCODONE-acetaminophen (PERCOCET) 10-325 MG tablet, Take 1 tablet by mouth in the morning and at bedtime. Can take one tablet three times a day as needed for back pain, Disp: , Rfl:    polyethylene glycol (MIRALAX / GLYCOLAX) 17 g packet, Take 17 g by mouth daily as needed for moderate constipation., Disp: , Rfl:    prasugrel (EFFIENT) 5 MG TABS tablet, TAKE 1 TABLET (5 MG TOTAL) BY MOUTH DAILY., Disp: 90 tablet, Rfl: 3   PRESCRIPTION MEDICATION, Place 2 puffs into both ears daily as needed (ear infections). [facility]SA[facility] ear powder, Disp: , Rfl:    Simethicone (GAS-X PO), Take 1-2 tablets by mouth daily as needed (gas)., Disp: , Rfl:    torsemide (DEMADEX) 20 MG tablet, Take by mouth., Disp: , Rfl:    tretinoin (RETIN-A) 0.1 % cream, Apply 1 application. topically every evening., Disp: , Rfl:   Past Medical History: Past Medical History:  Diagnosis Date   AAA (abdominal aortic aneurysm) (HCC)    a. s/p stent grafting in 2009.   Adenomatous colon polyp 05/2001   Adenomatous duodenal polyp    Allergy    Anginal pain (HCC)    COPD (chronic obstructive pulmonary disease) (HCC)    pt denies COPD   Coronary artery disease    a. s/p CABG in 1995;  b. 05/2013 Neg MV, EF 82%;  c. 06/2013 Cath: LM nl, LAD 40-50p, LCX nl, RCA 80p/168m, VG->RCA->PDA->PLSA min irregs, LIMA->LAD atretic, vigorous LV fxn.   Diabetes mellitus without complication (HCC)    history of, resolved. 2017   Diverticulosis    Eczema, dyshidrotic    Eczema, dyshidrotic 1986   Dr. Gerilyn Nestle   Emphysema of lung [facility]    Fatty liver    Fibromyalgia    GERD (gastroesophageal reflux disease)    Hyperlipidemia    Hypertension    Migraine    Myocardial infarction [facility])    Osteoarthritis    Osteoporosis    unsure, having a bone scan soon   Peripheral arterial disease (HCC)    left leg diagnosed by Dr Patty Sermons   Renal artery stenosis [facility])    a. 2008 s/p PTA    Tobacco Use: Social History   Tobacco Use  Smoking Status Former    Current packs/day: 0.00   Types: Cigarettes   Quit date: 12/06/1958   Years since quitting: 64.5   Passive exposure: Never  Smokeless Tobacco Never    Labs: Review Flowsheet  More data exists      Latest Ref Rng & Units 06/25/2021 01/06/2022 04/05/2022 06/21/2022 12/22/2022  Labs for ITP Cardiac and Pulmonary Rehab  Cholestrol 0 - 200 mg/dL 629  528  413  - 244   LDL (calc) 0 - 99 mg/dL 73  74  57  - 58   HDL-C >39.00  mg/dL 49  52  51  - 95.28   Trlycerides 0.0 - 149.0 mg/dL 413  244  010  - 272.5   Hemoglobin A1c 4.6 - 6.5 % - 5.8  - 5.8  6.1  5.7     Details       Multiple values from one day are sorted in reverse-chronological order         Capillary Blood Glucose: Lab Results  Component Value Date   GLUCAP 102 (H) 03/24/2022   GLUCAP 91 09/03/2021   GLUCAP 113 (H) 02/03/2021   GLUCAP 91 02/03/2021   GLUCAP 125 (H) 02/02/2021     Pulmonary Assessment Scores:  Pulmonary Assessment Scores     Row Name 06/22/23 1128         ADL UCSD   ADL Phase Entry     SOB Score total 74       CAT Score   CAT Score 31       mMRC Score   mMRC Score 2             UCSD: Self-administered rating of dyspnea associated with activities of daily living (ADLs) 6-point scale (0 = "not at all" to 5 = "maximal or unable to do because of breathlessness")  Scoring Scores range from 0 to 120.  Minimally important difference is 5 units  CAT: CAT can identify the health impairment of COPD patients and is better correlated with disease progression.  CAT has a scoring range of zero to 40. The CAT score is classified into four groups of low (less than 10), medium (10 - 20), high (21-30) and very high (31-40) based on the impact level of disease on health status. A CAT score over 10 suggests significant symptoms.  A worsening CAT score could be explained by an exacerbation, poor medication adherence, poor inhaler technique, or progression of COPD or comorbid conditions.  CAT MCID is 2  points  mMRC: mMRC (Modified Medical Research Council) Dyspnea Scale is used to assess the degree of baseline functional disability in patients of respiratory disease due to dyspnea. No minimal important difference is established. A decrease in score of 1 point or greater is considered a positive change.   Pulmonary Function Assessment:  Pulmonary Function Assessment - 06/22/23 1127       Breath   Bilateral Breath Sounds Rhonchi;Decreased    Shortness of Breath Limiting activity;Yes;Panic with Shortness of Breath             Exercise Target Goals: Exercise Program Goal: Individual exercise prescription set using results from initial 6 min walk test and THRR while considering  patient's activity barriers and safety.   Exercise Prescription Goal: Initial exercise prescription builds to 30-45 minutes a day of aerobic activity, 2-3 days per week.  Home exercise guidelines will be given to patient during program as part of exercise prescription that the participant will acknowledge.  Activity Barriers & Risk Stratification:  Activity Barriers & Cardiac Risk Stratification - 06/22/23 1124       Activity Barriers & Cardiac Risk Stratification   Activity Barriers Deconditioning;Muscular Weakness;Shortness of Breath;Back Problems;Balance Concerns;Arthritis;History of Falls;Assistive Device   walk with cane   Cardiac Risk Stratification High             6 Minute Walk:  6 Minute Walk     Row Name 06/22/23 1029         6 Minute Walk   Phase Initial     Distance 780 feet  Walk Time 6 minutes     # of Rest Breaks 0     MPH 1.48     METS 1.32     RPE 6.5     Perceived Dyspnea  0     VO2 Peak 4.61     Symptoms No     Resting HR 69 bpm     Resting BP 102/52     Resting Oxygen Saturation  91 %     Exercise Oxygen Saturation  during 6 min walk 86 %     Max Ex. HR 82 bpm     Max Ex. BP 138/66     2 Minute Post BP 120/54       Interval HR   1 Minute HR 77     2  Minute HR 80     3 Minute HR 82     4 Minute HR 79     5 Minute HR 79     6 Minute HR 80     2 Minute Post HR 71     Interval Heart Rate? Yes       Interval Oxygen   Interval Oxygen? Yes     Baseline Oxygen Saturation % 91 %     1 Minute Oxygen Saturation % 93 %     1 Minute Liters of Oxygen 0 L     2 Minute Oxygen Saturation % 88 %     2 Minute Liters of Oxygen 0 L     3 Minute Oxygen Saturation % 86 %  2:46     3 Minute Liters of Oxygen 0 L  increased to 1L     4 Minute Oxygen Saturation % 90 %     4 Minute Liters of Oxygen 1 L     5 Minute Oxygen Saturation % 89 %     5 Minute Liters of Oxygen 1 L     6 Minute Oxygen Saturation % 91 %     6 Minute Liters of Oxygen 1 L     2 Minute Post Oxygen Saturation % 98 %     2 Minute Post Liters of Oxygen 1 L              Oxygen Initial Assessment:  Oxygen Initial Assessment - 06/22/23 1126       Home Oxygen   Home Oxygen Device Home Concentrator;Portable Concentrator    Sleep Oxygen Prescription Continuous    Liters per minute 2    Home Exercise Oxygen Prescription Pulsed    Liters per minute 4    Home Resting Oxygen Prescription Continuous    Liters per minute 2    Compliance with Home Oxygen Use Yes      Initial 6 min Walk   Oxygen Used Continuous    Liters per minute 1      Program Oxygen Prescription   Program Oxygen Prescription Continuous    Liters per minute 1      Intervention   Short Term Goals To learn and exhibit compliance with exercise, home and travel O2 prescription;To learn and understand importance of maintaining oxygen saturations>88%;To learn and demonstrate proper use of respiratory medications;To learn and understand importance of monitoring SPO2 with pulse oximeter and demonstrate accurate use of the pulse oximeter.;To learn and demonstrate proper pursed lip breathing techniques or other breathing techniques.     Long  Term Goals Exhibits compliance with exercise, home  and travel O2  prescription;Maintenance of O2 saturations>88%;Compliance with  respiratory medication;Verbalizes importance of monitoring SPO2 with pulse oximeter and return demonstration;Exhibits proper breathing techniques, such as pursed lip breathing or other method taught during program session;Demonstrates proper use of MDI's             Oxygen Re-Evaluation:   Oxygen Discharge (Final Oxygen Re-Evaluation):   Initial Exercise Prescription:  Initial Exercise Prescription - 06/22/23 1200       Date of Initial Exercise RX and Referring Provider   Date 06/22/23    Referring Provider Hunsucker    Expected Discharge Date 09/15/23      Oxygen   Oxygen Continuous    Liters 1    Maintain Oxygen Saturation 88% or higher      Treadmill   MPH 2    Grade 0    Minutes 15      NuStep   Level 2    SPM 60    Minutes 15    METs 2.5      Prescription Details   Frequency (times per week) 2    Duration Progress to 30 minutes of continuous aerobic without signs/symptoms of physical distress      Intensity   THRR 40-80% of Max Heartrate 57-114    Ratings of Perceived Exertion 11-13    Perceived Dyspnea 0-4      Progression   Progression Continue to progress workloads to maintain intensity without signs/symptoms of physical distress.      Resistance Training   Training Prescription Yes    Weight red bands    Reps 10-15             Perform Capillary Blood Glucose checks as needed.  Exercise Prescription Changes:   Exercise Comments:   Exercise Goals and Review:   Exercise Goals     Row Name 06/22/23 1125             Exercise Goals   Increase Physical Activity Yes       Intervention Provide advice, education, support and counseling about physical activity/exercise needs.;Develop an individualized exercise prescription for aerobic and resistive training based on initial evaluation findings, risk stratification, comorbidities and participant's personal goals.        Expected Outcomes Short Term: Attend rehab on a regular basis to increase amount of physical activity.;Long Term: Exercising regularly at least 3-5 days a week.;Long Term: Add in home exercise to make exercise part of routine and to increase amount of physical activity.       Increase Strength and Stamina Yes       Intervention Provide advice, education, support and counseling about physical activity/exercise needs.;Develop an individualized exercise prescription for aerobic and resistive training based on initial evaluation findings, risk stratification, comorbidities and participant's personal goals.       Expected Outcomes Short Term: Increase workloads from initial exercise prescription for resistance, speed, and METs.;Short Term: Perform resistance training exercises routinely during rehab and add in resistance training at home;Long Term: Improve cardiorespiratory fitness, muscular endurance and strength as measured by increased METs and functional capacity ( )       Able to understand and use rate of perceived exertion (RPE) scale Yes       Intervention Provide education and explanation on how to use RPE scale       Expected Outcomes Short Term: Able to use RPE daily in rehab to express subjective intensity level;Long Term:  Able to use RPE to guide intensity level when exercising independently       Able to understand  and use Dyspnea scale Yes       Intervention Provide education and explanation on how to use Dyspnea scale       Expected Outcomes Short Term: Able to use Dyspnea scale daily in rehab to express subjective sense of shortness of breath during exertion;Long Term: Able to use Dyspnea scale to guide intensity level when exercising independently       Knowledge and understanding of Target Heart Rate Range (THRR) Yes       Intervention Provide education and explanation of THRR including how the numbers were predicted and where they are located for reference       Expected Outcomes Short  Term: Able to state/look up THRR;Long Term: Able to use THRR to govern intensity when exercising independently;Short Term: Able to use daily as guideline for intensity in rehab       Able to check pulse independently Yes       Understanding of Exercise Prescription Yes       Intervention Provide education, explanation, and written materials on patient's individual exercise prescription       Expected Outcomes Short Term: Able to explain program exercise prescription;Long Term: Able to explain home exercise prescription to exercise independently                Exercise Goals Re-Evaluation :   Discharge Exercise Prescription (Final Exercise Prescription Changes):   Nutrition:  Target Goals: Understanding of nutrition guidelines, daily intake of sodium 1500mg , cholesterol 200mg , calories 30% from fat and 7% or less from saturated fats, daily to have 5 or more servings of fruits and vegetables.  Biometrics:  Pre Biometrics - 06/22/23 1153       Pre Biometrics   Grip Strength 22 kg              Nutrition Therapy Plan and Nutrition Goals:   Nutrition Assessments:  MEDIFICTS Score Key: ?70 Need to make dietary changes  40-70 Heart Healthy Diet ? 40 Therapeutic Level Cholesterol Diet   Picture Your Plate Scores: <36 Unhealthy dietary pattern with much room for improvement. 41-50 Dietary pattern unlikely to meet recommendations for good health and room for improvement. 51-60 More healthful dietary pattern, with some room for improvement.  >60 Healthy dietary pattern, although there may be some specific behaviors that could be improved.    Nutrition Goals Re-Evaluation:   Nutrition Goals Discharge (Final Nutrition Goals Re-Evaluation):   Psychosocial: Target Goals: Acknowledge presence or absence of significant depression and/or stress, maximize coping skills, provide positive support system. Participant is able to verbalize types and ability to use techniques and  skills needed for reducing stress and depression.  Initial Review & Psychosocial Screening:  Initial Psych Review & Screening - 06/22/23 1120       Initial Review   Current issues with Current Stress Concerns;Current Anxiety/Panic    Source of Stress Concerns Chronic Illness;Family;Unable to participate in former interests or hobbies    Comments Pt identifies that she is dealing with alot of stress. Her husband's declining health as well as her declining health bring's her alot of stress and axiety.      Family Dynamics   Good Support System? Yes    Comments Pt has friends that provide her alot of support.      Barriers   Psychosocial barriers to participate in program The patient should benefit from training in stress management and relaxation.      Screening Interventions   Interventions Encouraged to exercise;To provide support and  resources with identified psychosocial needs    Expected Outcomes Short Term goal: Utilizing psychosocial counselor, staff and physician to assist with identification of specific Stressors or current issues interfering with healing process. Setting desired goal for each stressor or current issue identified.;Long Term Goal: Stressors or current issues are controlled or eliminated.;Short Term goal: Identification and review with participant of any Quality of Life or Depression concerns found by scoring the questionnaire.;Long Term goal: The participant improves quality of Life and PHQ9 Scores as seen by post scores and/or verbalization of changes             Quality of Life Scores:  Scores of 19 and below usually indicate a poorer quality of life in these areas.  A difference of  2-3 points is a clinically meaningful difference.  A difference of 2-3 points in the total score of the Quality of Life Index has been associated with significant improvement in overall quality of life, self-image, physical symptoms, and general health in studies assessing change in  quality of life.  PHQ-9: Review Flowsheet  More data exists      06/22/2023 06/15/2023 04/28/2023 12/22/2022 11/08/2022  Depression screen PHQ 2/9  Decreased Interest 0 0 0 0 0  Down, Depressed, Hopeless 1 0 0 0 0  PHQ - 2 Score 1 0 0 0 0  Altered sleeping 1 - - - -  Tired, decreased energy 3 - - - -  Change in appetite 0 - - - -  Feeling bad or failure about yourself  0 - - - -  Trouble concentrating 1 - - - -  Moving slowly or fidgety/restless 0 - - - -  Suicidal thoughts 0 - - - -  PHQ-9 Score 6 - - - -  Difficult doing work/chores Not difficult at all - - - -    Details           Interpretation of Total Score  Total Score Depression Severity:  1-4 = Minimal depression, 5-9 = Mild depression, 10-14 = Moderate depression, 15-19 = Moderately severe depression, 20-27 = Severe depression   Psychosocial Evaluation and Intervention:  Psychosocial Evaluation - 06/22/23 1122       Psychosocial Evaluation & Interventions   Interventions Stress management education;Encouraged to exercise with the program and follow exercise prescription    Comments Megan [name] identifies that she is dealing with alot of stress currently. She has had to place her husband in a nursing home. She is also worried about her declining health. She has worked with a Paramedic in the past, but therapist told her she didn't need therapy. She declines medications and therapy at this time.    Expected Outcomes For Megan [name] to participate in PR free of any psychosocial barriers or concers.    Continue Psychosocial Services  Follow up required by staff             Psychosocial Re-Evaluation:   Psychosocial Discharge (Final Psychosocial Re-Evaluation):   Education: Education Goals: Education classes will be provided on a weekly basis, covering required topics. Participant will state understanding/return demonstration of topics presented.  Learning Barriers/Preferences:  Learning Barriers/Preferences - 06/22/23  1123       Learning Barriers/Preferences   Learning Barriers Sight;Hearing    Learning Preferences Group Instruction             Education Topics: Introduction to Pulmonary Rehab Group instruction provided by PowerPoint, verbal discussion, and written material to support subject matter. Instructor reviews what Pulmonary Rehab is, the  purpose of the program, and how patients are referred.     Know Your Numbers Group instruction that is supported by a PowerPoint presentation. Instructor discusses importance of knowing and understanding resting, exercise, and post-exercise oxygen saturation, heart rate, and blood pressure. Oxygen saturation, heart rate, blood pressure, rating of perceived exertion, and dyspnea are reviewed along with a normal range for these values.    Exercise for the Pulmonary Patient Group instruction that is supported by a PowerPoint presentation. Instructor discusses benefits of exercise, core components of exercise, frequency, duration, and intensity of an exercise routine, importance of utilizing pulse oximetry during exercise, safety while exercising, and options of places to exercise outside of rehab.       MET Level  Group instruction provided by PowerPoint, verbal discussion, and written material to support subject matter. Instructor reviews what METs are and how to increase METs.    Pulmonary Medications Verbally interactive group education provided by instructor with focus on inhaled medications and proper administration.   Anatomy and Physiology of the Respiratory System Group instruction provided by PowerPoint, verbal discussion, and written material to support subject matter. Instructor reviews respiratory cycle and anatomical components of the respiratory system and their functions. Instructor also reviews differences in obstructive and restrictive respiratory diseases with examples of each.    Oxygen Safety Group instruction provided by  PowerPoint, verbal discussion, and written material to support subject matter. There is an overview of "What is Oxygen" and "Why do we need it".  Instructor also reviews how to create a safe environment for oxygen use, the importance of using oxygen as prescribed, and the risks of noncompliance. There is a brief discussion on traveling with oxygen and resources the patient may utilize.   Oxygen Use Group instruction provided by PowerPoint, verbal discussion, and written material to discuss how supplemental oxygen is prescribed and different types of oxygen supply systems. Resources for more information are provided.    Breathing Techniques Group instruction that is supported by demonstration and informational handouts. Instructor discusses the benefits of pursed lip and diaphragmatic breathing and detailed demonstration on how to perform both.     Risk Factor Reduction Group instruction that is supported by a PowerPoint presentation. Instructor discusses the definition of a risk factor, different risk factors for pulmonary disease, and how the heart and lungs work together.   MD Day A group question and answer session with a medical doctor that allows participants to ask questions that relate to their pulmonary disease state.   Nutrition for the Pulmonary Patient Group instruction provided by PowerPoint slides, verbal discussion, and written materials to support subject matter. The instructor gives an explanation and review of healthy diet recommendations, which includes a discussion on weight management, recommendations for fruit and vegetable consumption, as well as protein, fluid, caffeine, fiber, sodium, sugar, and alcohol. Tips for eating when patients are short of breath are discussed.    Other Education Group or individual verbal, written, or video instructions that support the educational goals of the pulmonary rehab program.    Knowledge Questionnaire Score:  Knowledge  Questionnaire Score - 06/22/23 1153       Knowledge Questionnaire Score   Pre Score 17/18             Core Components/Risk Factors/Patient Goals at Admission:  Personal Goals and Risk Factors at Admission - 06/22/23 1123       Core Components/Risk Factors/Patient Goals on Admission    Weight Management Yes;Weight Loss    Intervention Weight  Management: Develop a combined nutrition and exercise program designed to reach desired caloric intake, while maintaining appropriate intake of nutrient and fiber, sodium and fats, and appropriate energy expenditure required for the weight goal.;Weight Management: Provide education and appropriate resources to help participant work on and attain dietary goals.;Weight Management/Obesity: Establish reasonable short term and long term weight goals.;Obesity: Provide education and appropriate resources to help participant work on and attain dietary goals.    Expected Outcomes Weight Loss: Understanding of general recommendations for a balanced deficit meal plan, which promotes 1-2 lb weight loss per week and includes a negative energy balance of [phone] kcal/d;Understanding recommendations for meals to include 15-35% energy as protein, 25-35% energy from fat, 35-60% energy from carbohydrates, less than 200mg  of dietary cholesterol, 20-35 gm of total fiber daily;Understanding of distribution of calorie intake throughout the day with the consumption of 4-5 meals/snacks    Improve shortness of breath with ADL's Yes    Intervention Provide education, individualized exercise plan and daily activity instruction to help decrease symptoms of SOB with activities of daily living.    Expected Outcomes Short Term: Improve cardiorespiratory fitness to achieve a reduction of symptoms when performing ADLs;Long Term: Be able to perform more ADLs without symptoms or delay the onset of symptoms    Increase knowledge of respiratory medications and ability to use respiratory  devices properly  Yes    Intervention Provide education and demonstration as needed of appropriate use of medications, inhalers, and oxygen therapy.    Expected Outcomes Short Term: Achieves understanding of medications use. Understands that oxygen is a medication prescribed by physician. Demonstrates appropriate use of inhaler and oxygen therapy.;Long Term: Maintain appropriate use of medications, inhalers, and oxygen therapy.    Hypertension Yes    Intervention Provide education on lifestyle modifcations including regular physical activity/exercise, weight management, moderate sodium restriction and increased consumption of fresh fruit, vegetables, and low fat dairy, alcohol moderation, and smoking cessation.;Monitor prescription use compliance.    Expected Outcomes Short Term: Continued assessment and intervention until BP is < 140/43mm HG in hypertensive participants. < 130/60mm HG in hypertensive participants with diabetes, heart failure or chronic kidney disease.;Long Term: Maintenance of blood pressure at goal levels.    Stress Yes    Intervention Offer individual and/or small group education and counseling on adjustment to heart disease, stress management and health-related lifestyle change. Teach and support self-help strategies.;Refer participants experiencing significant psychosocial distress to appropriate mental health specialists for further evaluation and treatment. When possible, include family members and significant others in education/counseling sessions.    Expected Outcomes Short Term: Participant demonstrates changes in health-related behavior, relaxation and other stress management skills, ability to obtain effective social support, and compliance with psychotropic medications if prescribed.;Long Term: Emotional wellbeing is indicated by absence of clinically significant psychosocial distress or social isolation.             Core Components/Risk Factors/Patient Goals Review:     Core Components/Risk Factors/Patient Goals at Discharge (Final Review):    ITP Comments:   Comments: Dr. Mechele Collin is Medical Director for Pulmonary Rehab at [facility].

## 2023-06-22 NOTE — Progress Notes (Signed)
Carolynn Serve 77 y.o. female Pulmonary Rehab Orientation Note This patient who was referred to Pulmonary Rehab by Dr. [name] with the diagnosis of Pulmonary emphysema arrived today in Cardiac and Pulmonary Rehab. She  arrived ambulatory with assistive device with normal gait. She  does carry portable oxygen. [employer] is the provider for their DME. Per patient, Caliah uses oxygen continuously. Color good, skin warm and dry. Patient is oriented to time and place. Patient's medical history, psychosocial health, and medications reviewed. Psychosocial assessment reveals patient lives with alone. Elya is currently retired. Patient hobbies include spending time with others, reading, and playing cards . Patient reports her stress level is high. Areas of stress/anxiety include health and family . Patient does exhibit signs of depression. Signs of depression include anxiety and helplessness and fatigue. PHQ2/9 score 1/6. Detria refuses any treatment for possible depression at this time. Edla shows good  coping skills with positive outlook on life. Offered emotional support and reassurance. Will continue to monitor. Physical assessment performed by Nurse pick: Essie Hart RN. Please see their orientation physical assessment note. Jaquitta reports she does take medications as prescribed. Patient states she follows a regular  diet. The patient has been trying to lose weight through a healthy diet and exercise program.. Patient's weight will be monitored closely. Demonstration and practice of PLB using pulse oximeter. Shamera able to return demonstration satisfactorily. Safety and hand hygiene in the exercise area reviewed with patient. Elona voices understanding of the information reviewed. Department expectations discussed with patient and achievable goals were set. The patient shows enthusiasm about attending the program and we look forward to working with [facility]. Melitta completed a 6 min walk test today and is scheduled  to begin exercise on 7/23@10 :15.   [id number] Guss Bunde, BSRT

## 2023-06-22 NOTE — Progress Notes (Signed)
Pulmonary Rehab Orientation Physical Assessment Note  Physical assessment reveals patient is alert and oriented x 4. Heart rate is normal, breath sounds diminished to auscultation with rhonchi to R lung. Pt reports chronic productive cough. Bowel sounds present x4 quads. Pt denies abdominal discomfort, nausea, vomiting, diarrhea or constipation. Grip strength equal, strong. Distal pulses +2; no swelling to lower extremities. Healing wounds to RLE. Pt states bumps into things and prior falls. Anterior R lump, healing scab to R knee, healing anterior shin wound. R mid anterior leg with blood blister noted. Would care performed with non-adherent dressing. Educated pt on wound care. Pt understands without assistance. Extra wound care supplies sent home with patient. Fall evaluation completed, education provided, and fall prevention discussed with patient. Pt understands without assistance.     Essie Hart, RN, BSN

## 2023-06-23 ENCOUNTER — Other Ambulatory Visit: Payer: Self-pay

## 2023-06-23 DIAGNOSIS — J4489 Other specified chronic obstructive pulmonary disease: Secondary | ICD-10-CM | POA: Diagnosis not present

## 2023-06-23 DIAGNOSIS — Z95828 Presence of other vascular implants and grafts: Secondary | ICD-10-CM

## 2023-06-23 DIAGNOSIS — J9601 Acute respiratory failure with hypoxia: Secondary | ICD-10-CM | POA: Diagnosis not present

## 2023-06-28 ENCOUNTER — Encounter (HOSPITAL_COMMUNITY): Payer: Self-pay

## 2023-06-28 ENCOUNTER — Encounter (HOSPITAL_COMMUNITY)
Admission: RE | Admit: 2023-06-28 | Discharge: 2023-06-28 | Disposition: A | Discharge status: Home / Self Care | Payer: Medicare Other | Source: Ambulatory Visit | Attending: Pulmonary Disease | Admitting: Pulmonary Disease

## 2023-06-28 VITALS — Wt 134.3 lb

## 2023-06-28 DIAGNOSIS — J439 Emphysema, unspecified: Secondary | ICD-10-CM

## 2023-06-28 NOTE — Progress Notes (Signed)
Daily Session Note  Patient Details  Name: Megan [name] MRN: [id number] Date of Birth: July 04, 1946 Referring Provider:   Doristine Devoid Pulmonary Rehab Walk Test from 06/22/2023 in [facility]  Referring Provider Hunsucker       Encounter Date: 06/28/2023  Check In:  Session Check In - 06/28/23 1212       Check-In   Supervising physician immediately available to respond to emergencies [facility] MD immediately available    Physician(s) Bernadene Person, NP    Location [facility]    Staff Present Essie Hart, RN, Doris Cheadle, MS, ACSM-CEP, Exercise Physiologist;Casey Katrinka Blazing, RT    Virtual Visit No    Medication changes reported     No    Fall or balance concerns reported    Yes    Comments fell 2 weeks ago    Tobacco Cessation No Change    Warm-up and Cool-down Performed as group-led instruction    Resistance Training Performed No    VAD Patient? No      Pain Assessment   Currently in Pain? No/denies    Multiple Pain Sites No             Capillary Blood Glucose: No results found. However, due to the size of the patient record, not all encounters were searched. Please check Results Review for a complete set of results.   Exercise Prescription Changes - 06/28/23 1200       Response to Exercise   Blood Pressure (Admit) 102/54    Blood Pressure (Exercise) 108/66    Blood Pressure (Exit) 98/60    Heart Rate (Admit) 79 bpm    Heart Rate (Exercise) 89 bpm    Heart Rate (Exit) 70 bpm    Oxygen Saturation (Admit) 93 %    Oxygen Saturation (Exercise) 91 %    Oxygen Saturation (Exit) 94 %    Rating of Perceived Exertion (Exercise) 10    Perceived Dyspnea (Exercise) 0    Duration Progress to 30 minutes of  aerobic without signs/symptoms of physical distress    Intensity THRR unchanged      Progression   Progression Continue to progress workloads to maintain intensity without signs/symptoms of  physical distress.      Resistance Training   Training Prescription Yes    Weight red bands    Reps 10-15    Time 10 Minutes      Oxygen   Oxygen Continuous    Liters 1      Treadmill   MPH 2    Grade 0    Minutes 15    METs 2.53      NuStep   Level 2    SPM 54    Minutes 15    METs 1.6      Oxygen   Maintain Oxygen Saturation 88% or higher             Social History   Tobacco Use  Smoking Status Former   Current packs/day: 0.00   Types: Cigarettes   Quit date: 12/06/1958   Years since quitting: 64.6   Passive exposure: Never  Smokeless Tobacco Never    Goals Met:  Exercise tolerated well No report of concerns or symptoms today Strength training completed today  Goals Unmet:  Not Applicable  Comments: Service time is from 1022 to 1154    Dr. Mechele Collin is Medical Director for Pulmonary Rehab at [facility].

## 2023-06-29 ENCOUNTER — Telehealth: Payer: Self-pay | Admitting: Adult Health

## 2023-06-29 NOTE — Telephone Encounter (Signed)
pt. picked up the Flutter valve, but she states that is not the device she was talking about could someone talk to here

## 2023-06-29 NOTE — Telephone Encounter (Signed)
Called pt, pt will pick a flutter value up from the office today around 1:30/2:00. This will be left at the front desk. Nfn at this time

## 2023-06-30 ENCOUNTER — Encounter (HOSPITAL_COMMUNITY)
Admission: RE | Admit: 2023-06-30 | Discharge: 2023-06-30 | Disposition: A | Discharge status: Home / Self Care | Payer: Medicare Other | Source: Ambulatory Visit | Attending: Pulmonary Disease | Admitting: Pulmonary Disease

## 2023-06-30 DIAGNOSIS — J439 Emphysema, unspecified: Secondary | ICD-10-CM | POA: Diagnosis not present

## 2023-06-30 NOTE — Progress Notes (Signed)
Daily Session Note  Patient Details  Name: Megan [name] MRN: [id number] Date of Birth: Jun 08, 1946 Referring Provider:   Doristine Devoid Pulmonary Rehab Walk Test from 06/22/2023 in [facility]  Referring Provider Hunsucker       Encounter Date: 06/30/2023  Check In:  Session Check In - 06/30/23 1138       Check-In   Supervising physician immediately available to respond to emergencies [facility] MD immediately available    Physician(s) Eligha Bridegroom, NP    [facility]    Staff Present Raford Pitcher, MS, ACSM-CEP, Exercise Physiologist;Huel Centola Katrinka Blazing, RT;Jetta Dan Humphreys BS, ACSM-CEP, Exercise Physiologist;Carlette Les Pou, RN, BSN    Virtual Visit No    Medication changes reported     No    Fall or balance concerns reported    No    Tobacco Cessation No Change    Warm-up and Cool-down Performed as group-led instruction    Resistance Training Performed Yes    VAD Patient? No    PAD/SET Patient? No      Pain Assessment   Currently in Pain? No/denies    Multiple Pain Sites No             Capillary Blood Glucose: No results found. However, due to the size of the patient record, not all encounters were searched. Please check Results Review for a complete set of results.    Social History   Tobacco Use  Smoking Status Former   Current packs/day: 0.00   Types: Cigarettes   Quit date: 12/06/1958   Years since quitting: 64.6   Passive exposure: Never  Smokeless Tobacco Never    Goals Met:  Proper associated with RPD/PD & O2 Sat Independence with exercise equipment Exercise tolerated well No report of concerns or symptoms today Strength training completed today  Goals Unmet:  Not Applicable  Comments: Service time is from 1011 to 1141.    Dr. Mechele Collin is Medical Director for Pulmonary Rehab at [facility].

## 2023-07-05 ENCOUNTER — Encounter (HOSPITAL_COMMUNITY): Admission: RE | Admit: 2023-07-05 | Payer: Medicare Other | Source: Ambulatory Visit

## 2023-07-05 DIAGNOSIS — J439 Emphysema, unspecified: Secondary | ICD-10-CM | POA: Diagnosis not present

## 2023-07-05 NOTE — Progress Notes (Signed)
Daily Session Note  Patient Details  Name: ANAIKA WIGGERS MRN: [id number] Date of Birth: December 22, 1945 Referring Provider:   Doristine Devoid Pulmonary Rehab Walk Test from 06/22/2023 in [facility], [facility]  Referring Provider Hunsucker       Encounter Date: 07/05/2023  Check In:  Session Check In - 07/05/23 1223       Check-In   Supervising physician immediately available to respond to emergencies [facility] MD immediately available    Physician(s) Robin Searing, NP    [facility]    Staff Present Essie Hart, RN, BSN;Randi Idelle Crouch BS, ACSM-CEP, Exercise Physiologist;Casey Katrinka Blazing, RT    Virtual Visit No    Medication changes reported     No    Fall or balance concerns reported    No    Tobacco Cessation No Change    Warm-up and Cool-down Performed as group-led instruction    Resistance Training Performed Yes    VAD Patient? No    PAD/SET Patient? No      Pain Assessment   Currently in Pain? No/denies    Multiple Pain Sites No             Capillary Blood Glucose: No results found. However, due to the size of the patient record, not all encounters were searched. Please check Results Review for a complete set of results.    Social History   Tobacco Use  Smoking Status Former   Current packs/day: 0.00   Types: Cigarettes   Quit date: 12/06/1958   Years since quitting: 64.6   Passive exposure: Never  Smokeless Tobacco Never    Goals Met:  Exercise tolerated well No report of concerns or symptoms today Strength training completed today  Goals Unmet:  Not Applicable  Comments: Service time is from 1015 to 1145    Dr. Mechele Collin is Medical Director for Pulmonary Rehab at [facility].

## 2023-07-07 ENCOUNTER — Encounter (HOSPITAL_COMMUNITY)
Admission: RE | Admit: 2023-07-07 | Discharge: 2023-07-07 | Disposition: A | Discharge status: Home / Self Care | Payer: Medicare Other | Source: Ambulatory Visit | Attending: Pulmonary Disease | Admitting: Pulmonary Disease

## 2023-07-07 DIAGNOSIS — J439 Emphysema, unspecified: Secondary | ICD-10-CM | POA: Diagnosis not present

## 2023-07-07 NOTE — Progress Notes (Signed)
Daily Session Note  Patient Details  Name: VIYONA GUTMANN MRN: [id number] Date of Birth: 1946-11-13 Referring Provider:   Doristine Devoid Pulmonary Rehab Walk Test from 06/22/2023 in [facility]  Referring Provider Hunsucker       Encounter Date: 07/07/2023  Check In:  Session Check In - 07/07/23 1144       Check-In   Supervising physician immediately available to respond to emergencies [facility] immediately available    Physician(s) Carlyon Shadow, NP    [facility]    Staff Present Samantha Belarus, RD, LDN;Randi Dionisio Paschal, ACSM-CEP, Exercise Physiologist;Alfonsa Vaile Gerre Scull, RN, BSN;Casey Smith, RT;Jetta Walker BS, ACSM-CEP, Exercise Physiologist    Virtual Visit No    Medication changes reported     No    Fall or balance concerns reported    No    Tobacco Cessation No Change    Warm-up and Cool-down Performed as group-led instruction    Resistance Training Performed Yes    VAD Patient? No    PAD/SET Patient? No      Pain Assessment   Currently in Pain? No/denies    Multiple Pain Sites No             Capillary Blood Glucose: No results found. However, due to the size of the patient record, not all encounters were searched. Please check Results Review for a complete set of results.    Social History   Tobacco Use  Smoking Status Former   Current packs/day: 0.00   Types: Cigarettes   Quit date: 12/06/1958   Years since quitting: 64.6   Passive exposure: Never  Smokeless Tobacco Never    Goals Met:  Independence with exercise equipment Exercise tolerated well No report of concerns or symptoms today Strength training completed today  Goals Unmet:  Not Applicable  Comments: Service time is from 1021 to 1151    Dr. Mechele Collin is Medical Director for Pulmonary Rehab at [facility].

## 2023-07-08 ENCOUNTER — Encounter: Payer: Self-pay | Admitting: Cardiology

## 2023-07-11 ENCOUNTER — Other Ambulatory Visit: Payer: Self-pay

## 2023-07-11 MED ORDER — TORSEMIDE 20 MG PO TABS: ORAL_TABLET | ORAL | 3 refills | Status: DC

## 2023-07-12 ENCOUNTER — Encounter (HOSPITAL_COMMUNITY): Admission: RE | Admit: 2023-07-12 | Payer: Medicare Other | Source: Ambulatory Visit

## 2023-07-12 VITALS — Wt 135.6 lb

## 2023-07-12 DIAGNOSIS — J439 Emphysema, unspecified: Secondary | ICD-10-CM | POA: Diagnosis not present

## 2023-07-12 NOTE — Progress Notes (Signed)
Daily Session Note  Patient Details  Name: Megan [name] MRN: [id number] Date of Birth: [birth year] Referring Provider:   Doristine Devoid Pulmonary Rehab Walk Test from 06/22/2023 in [facility]  Referring Provider Hunsucker       Encounter Date: 07/12/2023  Check In:  Session Check In - 07/12/23 1212       Check-In   Supervising physician immediately available to respond to emergencies [facility] MD immediately available    Physician(s) Carlyon Shadow, NP    [facility]    Staff Present Raford Pitcher, MS, ACSM-CEP, Exercise Physiologist;Randi Dionisio Paschal, ACSM-CEP, Exercise Physiologist;Samantha Belarus, RD, Dutch Gray, RN, BSN;Johnny Porter, MS, Exercise Physiologist; Katrinka Blazing, RT    Virtual Visit No    Medication changes reported     No    Fall or balance concerns reported    No    Tobacco Cessation No Change    Warm-up and Cool-down Performed as group-led instruction    Resistance Training Performed Yes    VAD Patient? No    PAD/SET Patient? No      Pain Assessment   Currently in Pain? No/denies    Multiple Pain Sites No             Capillary Blood Glucose: No results found. However, due to the size of the patient record, not all encounters were searched. Please check Results Review for a complete set of results.   Exercise Prescription Changes - 07/12/23 1200       Response to Exercise   Blood Pressure (Admit) 104/56    Blood Pressure (Exercise) 138/66    Blood Pressure (Exit) 118/56    Heart Rate (Admit) 71 bpm    Heart Rate (Exercise) 95 bpm    Heart Rate (Exit) 70 bpm    Oxygen Saturation (Admit) 92 %    Oxygen Saturation (Exercise) 96 %    Oxygen Saturation (Exit) 94 %    Rating of Perceived Exertion (Exercise) 11    Perceived Dyspnea (Exercise) 0    Duration Continue with 30 min of aerobic exercise without signs/symptoms of physical distress.    Intensity THRR  unchanged      Progression   Progression Continue to progress workloads to maintain intensity without signs/symptoms of physical distress.      Resistance Training   Training Prescription Yes    Weight red bands    Reps 10-15    Time 10 Minutes      Oxygen   Oxygen Continuous    Liters 2-3      Treadmill   MPH 2.5    Grade 1    Minutes 15    METs 3.26      NuStep   Level 3    Minutes 15    METs 2.3      Oxygen   Maintain Oxygen Saturation 88% or higher             Social History   Tobacco Use  Smoking Status Former   Current packs/day: 0.00   Types: Cigarettes   Quit date: 12/06/1958   Years since quitting: 64.6   Passive exposure: Never  Smokeless Tobacco Never    Goals Met:  Proper associated with RPD/PD & O2 Sat Independence with exercise equipment Exercise tolerated well No report of concerns or symptoms today Strength training completed today  Goals Unmet:  Not Applicable  Comments: Service time is from 1019 to 1139.  Dr. Mechele Collin is Medical Director for Pulmonary Rehab at [facility].

## 2023-07-13 ENCOUNTER — Other Ambulatory Visit: Payer: Medicare Other

## 2023-07-13 ENCOUNTER — Ambulatory Visit
Admission: RE | Admit: 2023-07-13 | Discharge: 2023-07-13 | Disposition: A | Discharge status: Home / Self Care | Payer: Medicare Other | Source: Ambulatory Visit | Attending: Vascular Surgery | Admitting: Vascular Surgery

## 2023-07-13 ENCOUNTER — Other Ambulatory Visit: Payer: Self-pay | Admitting: Cardiology

## 2023-07-13 DIAGNOSIS — Z95828 Presence of other vascular implants and grafts: Secondary | ICD-10-CM | POA: Diagnosis not present

## 2023-07-13 MED ORDER — IOPAMIDOL (ISOVUE-370) INJECTION 76%
100.0000 mL | Freq: Once | INTRAVENOUS | Status: AC | PRN
  Administered 2023-07-13: 80 mL via INTRAVENOUS

## 2023-07-13 NOTE — Progress Notes (Signed)
Pulmonary Individual Treatment Plan  Patient Details  Name: Megan [name] MRN: [id number] Date of Birth: [birth year] Referring Provider:   Doristine Devoid Pulmonary Rehab Walk Test from 06/22/2023 in [facility]  Referring Provider Hunsucker       Initial Encounter Date:  Flowsheet Row Pulmonary Rehab Walk Test from 06/22/2023 in [facility]  Date 06/22/23       Visit Diagnosis: Pulmonary emphysema, unspecified emphysema type (HCC)  Patient's Home Medications on Admission:   Current Outpatient Medications:    Accu-Chek FastClix Lancets MISC, Check blood sugar once daily PRN, Disp: 100 each, Rfl: 1   albuterol (PROAIR HFA) 108 (90 Base) MCG/ACT inhaler, Inhale 2 puffs into the lungs every 6 (six) hours as needed for wheezing or shortness of breath., Disp: 1 each, Rfl: 11   albuterol (PROVENTIL) (2.5 MG/3ML) 0.083% nebulizer solution, Take 3 mLs (2.5 mg total) by nebulization every 6 (six) hours as needed for wheezing or shortness of breath., Disp: 360 mL, Rfl: 5   ALPRAZolam (XANAX) 0.25 MG tablet, Take 1 tablet (0.25 mg total) by mouth at bedtime as needed for sleep., Disp: 90 tablet, Rfl: 3   amLODipine (NORVASC) 5 MG tablet, Take 1 tablet (5 mg total) by mouth daily., Disp: 90 tablet, Rfl: 3   aspirin EC 81 MG tablet, Take 1 tablet (81 mg total) by mouth daily., Disp: 90 tablet, Rfl: 3   atorvastatin (LIPITOR) 80 MG tablet, TAKE 1 TABLET BY MOUTH EVERY DAY, Disp: 90 tablet, Rfl: 3   B Complex-Biotin-FA (B-100 COMPLEX PO), Take 1 tablet by mouth every evening., Disp: , Rfl:    bismuth subsalicylate (PEPTO BISMOL) 262 MG/15ML suspension, Take 30 mLs by mouth every 6 (six) hours as needed for indigestion or diarrhea or loose stools., Disp: , Rfl:    Budeson-Glycopyrrol-Formoterol (BREZTRI AEROSPHERE) 160-9-4.8 MCG/ACT AERO, Inhale 2 puffs into the lungs in the morning and  at bedtime., Disp: 1 each, Rfl: 11   cetirizine (ZYRTEC) 10 MG tablet, Take 10 mg by mouth daily as needed for allergies., Disp: , Rfl:    cholecalciferol (VITAMIN D3) 25 MCG (1000 UNIT) tablet, Take 1,000 Units by mouth every evening., Disp: , Rfl:    CORDRAN 4 MCG/SQCM TAPE, Apply 1 each topically daily as needed (Rash). , Disp: , Rfl:    cycloSPORINE (RESTASIS) 0.05 % ophthalmic emulsion, Place 1 drop into both eyes 2 (two) times daily. , Disp: , Rfl:    Ergocalciferol 50 MCG (2000 UT) CAPS, Take by mouth., Disp: , Rfl:    ezetimibe (ZETIA) 10 MG tablet, TAKE 1 TABLET BY MOUTH EVERY DAY, Disp: 90 tablet, Rfl: 3   gabapentin (NEURONTIN) 300 MG capsule, Take 1 capsule (300 mg total) by mouth 2 (two) times daily., Disp: 270 capsule, Rfl: 3   glucose blood (ACCU-CHEK SMARTVIEW) test strip, CHECK BLOOD SUGAR ONCE DAILY AS NEEDED, Disp: 100 strip, Rfl: 1   hydrochlorothiazide (HYDRODIURIL) 12.5 MG tablet, TAKE 1 TABLET BY MOUTH EVERY DAY, Disp: 90 tablet, Rfl: 3   isosorbide mononitrate (IMDUR) 30 MG 24 hr tablet, TAKE 3 TABLETS ( 90 MG ) EVERY MORNING AND 1 TABLET (30 MG ) EVERY EVENING, Disp: 360 tablet, Rfl: 1   losartan (COZAAR) 100 MG tablet, TAKE 1 TABLET BY MOUTH EVERY DAY, Disp: 90 tablet, Rfl: 3   metoprolol succinate (TOPROL-XL) 25 MG 24 hr tablet, TAKE 1 TABLET (25 MG TOTAL) BY  MOUTH DAILY., Disp: 90 tablet, Rfl: 2   Multiple Vitamins-Minerals (MULTIVITAMIN WITH MINERALS) tablet, Take 1 tablet by mouth every evening., Disp: , Rfl:    nitroGLYCERIN (NITROSTAT) 0.4 MG SL tablet, Place 1 tablet (0.4 mg total) under the tongue every 5 (five) minutes as needed for chest pain., Disp: 25 tablet, Rfl: 11   omeprazole (PRILOSEC) 40 MG capsule, Take 1 capsule (40 mg total) by mouth in the morning and at bedtime., Disp: 180 capsule, Rfl: 3   ondansetron (ZOFRAN) 8 MG tablet, Take 1 tablet (8 mg total) by mouth every 6 (six) hours as needed for nausea or vomiting., Disp: 20 tablet, Rfl: 0    oxyCODONE-acetaminophen (PERCOCET) 10-325 MG tablet, Take 1 tablet by mouth in the morning and at bedtime. Can take one tablet three times a day as needed for back pain, Disp: , Rfl:    polyethylene glycol (MIRALAX / GLYCOLAX) 17 g packet, Take 17 g by mouth daily as needed for moderate constipation., Disp: , Rfl:    prasugrel (EFFIENT) 5 MG TABS tablet, TAKE 1 TABLET (5 MG TOTAL) BY MOUTH DAILY., Disp: 90 tablet, Rfl: 3   PRESCRIPTION MEDICATION, Place 2 puffs into both ears daily as needed (ear infections). [facility] ear powder, Disp: , Rfl:    Simethicone (GAS-X PO), Take 1-2 tablets by mouth daily as needed (gas)., Disp: , Rfl:    torsemide (DEMADEX) 20 MG tablet, Take 1/2 tablet daily as needed for swelling, Disp: 30 tablet, Rfl: 3   tretinoin (RETIN-A) 0.1 % cream, Apply 1 application. topically every evening., Disp: , Rfl:   Past Medical History: Past Medical History:  Diagnosis Date   AAA (abdominal aortic aneurysm) (HCC)    a. s/p stent grafting in 2009.   Adenomatous colon polyp 05/2001   Adenomatous duodenal polyp    Allergy    Anginal pain (HCC)    COPD (chronic obstructive pulmonary disease) (HCC)    pt denies COPD   Coronary artery disease    a. s/p CABG in 1995;  b. 05/2013 Neg MV, EF 82%;  c. 06/2013 Cath: LM nl, LAD 40-50p, LCX nl, RCA 80p/14m, VG->RCA->PDA->PLSA min irregs, LIMA->LAD atretic, vigorous LV fxn.   Diabetes mellitus without complication (HCC)    history of, resolved. 2017   Diverticulosis    Eczema, dyshidrotic    Eczema, dyshidrotic 1986   Dr. Gerilyn Nestle   Emphysema of lung [facility])    Fatty liver    Fibromyalgia    GERD (gastroesophageal reflux disease)    Hyperlipidemia    Hypertension    Migraine    Myocardial infarction [facility])    Osteoarthritis    Osteoporosis    unsure, having a bone scan soon   Peripheral arterial disease (HCC)    left leg diagnosed by Dr Patty Sermons   Renal artery stenosis [facility])    a. 2008 s/p PTA    Tobacco Use: Social History    Tobacco Use  Smoking Status Former   Current packs/day: 0.00   Types: Cigarettes   Quit date: 12/06/1958   Years since quitting: 64.6   Passive exposure: Never  Smokeless Tobacco Never    Labs: Review Flowsheet  More data exists      Latest Ref Rng & Units 06/25/2021 01/06/2022 04/05/2022 06/21/2022 12/22/2022  Labs for ITP Cardiac and Pulmonary Rehab  Cholestrol 0 - 200 mg/dL 161  096  045  - 409   LDL (calc) 0 - 99 mg/dL 73  74  57  -  58   HDL-C >39.00 mg/dL 49  52  51  - 29.56   Trlycerides 0.0 - 149.0 mg/dL 213  086  578  - 469.6   Hemoglobin A1c 4.6 - 6.5 % - 5.8  - 5.8  6.1  5.7     Details       Multiple values from one day are sorted in reverse-chronological order         Capillary Blood Glucose: Lab Results  Component Value Date   GLUCAP 102 (H) 03/24/2022   GLUCAP 91 09/03/2021   GLUCAP 113 (H) 02/03/2021   GLUCAP 91 02/03/2021   GLUCAP 125 (H) 02/02/2021     Pulmonary Assessment Scores:  Pulmonary Assessment Scores     Row Name 06/22/23 1128         ADL UCSD   ADL Phase Entry     SOB Score total 74       CAT Score   CAT Score 31       mMRC Score   mMRC Score 2             UCSD: Self-administered rating of dyspnea associated with activities of daily living (ADLs) 6-point scale (0 = "not at all" to 5 = "maximal or unable to do because of breathlessness")  Scoring Scores range from 0 to 120.  Minimally important difference is 5 units  CAT: CAT can identify the health impairment of COPD patients and is better correlated with disease progression.  CAT has a scoring range of zero to 40. The CAT score is classified into four groups of low (less than 10), medium (10 - 20), high (21-30) and very high (31-40) based on the impact level of disease on health status. A CAT score over 10 suggests significant symptoms.  A worsening CAT score could be explained by an exacerbation, poor medication adherence, poor inhaler technique, or progression of COPD or  comorbid conditions.  CAT MCID is 2 points  mMRC: mMRC (Modified Medical Research Council) Dyspnea Scale is used to assess the degree of baseline functional disability in patients of respiratory disease due to dyspnea. No minimal important difference is established. A decrease in score of 1 point or greater is considered a positive change.   Pulmonary Function Assessment:  Pulmonary Function Assessment - 06/22/23 1127       Breath   Bilateral Breath Sounds Rhonchi;Decreased    Shortness of Breath Limiting activity;Yes;Panic with Shortness of Breath             Exercise Target Goals: Exercise Program Goal: Individual exercise prescription set using results from initial 6 min walk test and THRR while considering  patient's activity barriers and safety.   Exercise Prescription Goal: Initial exercise prescription builds to 30-45 minutes a day of aerobic activity, 2-3 days per week.  Home exercise guidelines will be given to patient during program as part of exercise prescription that the participant will acknowledge.  Activity Barriers & Risk Stratification:  Activity Barriers & Cardiac Risk Stratification - 06/22/23 1124       Activity Barriers & Cardiac Risk Stratification   Activity Barriers Deconditioning;Muscular Weakness;Shortness of Breath;Back Problems;Balance Concerns;Arthritis;History of Falls;Assistive Device   walk with cane   Cardiac Risk Stratification High             6 Minute Walk:  6 Minute Walk     Row Name 06/22/23 1029         6 Minute Walk   Phase Initial  Distance 780 feet     Walk Time 6 minutes     # of Rest Breaks 0     MPH 1.48     METS 1.32     RPE 6.5     Perceived Dyspnea  0     VO2 Peak 4.61     Symptoms No     Resting HR 69 bpm     Resting BP 102/52     Resting Oxygen Saturation  91 %     Exercise Oxygen Saturation  during 6 min walk 86 %     Max Ex. HR 82 bpm     Max Ex. BP 138/66     2 Minute Post BP 120/54        Interval HR   1 Minute HR 77     2 Minute HR 80     3 Minute HR 82     4 Minute HR 79     5 Minute HR 79     6 Minute HR 80     2 Minute Post HR 71     Interval Heart Rate? Yes       Interval Oxygen   Interval Oxygen? Yes     Baseline Oxygen Saturation % 91 %     1 Minute Oxygen Saturation % 93 %     1 Minute Liters of Oxygen 0 L     2 Minute Oxygen Saturation % 88 %     2 Minute Liters of Oxygen 0 L     3 Minute Oxygen Saturation % 86 %  2:46     3 Minute Liters of Oxygen 0 L  increased to 1L     4 Minute Oxygen Saturation % 90 %     4 Minute Liters of Oxygen 1 L     5 Minute Oxygen Saturation % 89 %     5 Minute Liters of Oxygen 1 L     6 Minute Oxygen Saturation % 91 %     6 Minute Liters of Oxygen 1 L     2 Minute Post Oxygen Saturation % 98 %     2 Minute Post Liters of Oxygen 1 L              Oxygen Initial Assessment:  Oxygen Initial Assessment - 06/22/23 1126       Home Oxygen   Home Oxygen Device Home Concentrator;Portable Concentrator    Sleep Oxygen Prescription Continuous    Liters per minute 2    Home Exercise Oxygen Prescription Pulsed    Liters per minute 4    Home Resting Oxygen Prescription Continuous    Liters per minute 2    Compliance with Home Oxygen Use Yes      Initial 6 min Walk   Oxygen Used Continuous    Liters per minute 1      Program Oxygen Prescription   Program Oxygen Prescription Continuous    Liters per minute 1      Intervention   Short Term Goals To learn and exhibit compliance with exercise, home and travel O2 prescription;To learn and understand importance of maintaining oxygen saturations>88%;To learn and demonstrate proper use of respiratory medications;To learn and understand importance of monitoring SPO2 with pulse oximeter and demonstrate accurate use of the pulse oximeter.;To learn and demonstrate proper pursed lip breathing techniques or other breathing techniques.     Long  Term Goals Exhibits compliance with  exercise, home  and  travel O2 prescription;Maintenance of O2 saturations>88%;Compliance with respiratory medication;Verbalizes importance of monitoring SPO2 with pulse oximeter and return demonstration;Exhibits proper breathing techniques, such as pursed lip breathing or other method taught during program session;Demonstrates proper use of MDI's             Oxygen Re-Evaluation:  Oxygen Re-Evaluation     Row Name 07/01/23 1046             Program Oxygen Prescription   Program Oxygen Prescription Continuous       Liters per minute 1         Home Oxygen   Home Oxygen Device Home Concentrator;Portable Concentrator       Sleep Oxygen Prescription Continuous       Liters per minute 2       Home Exercise Oxygen Prescription Pulsed       Liters per minute 4       Home Resting Oxygen Prescription Continuous       Liters per minute 2       Compliance with Home Oxygen Use Yes         Goals/Expected Outcomes   Short Term Goals To learn and exhibit compliance with exercise, home and travel O2 prescription;To learn and understand importance of maintaining oxygen saturations>88%;To learn and demonstrate proper use of respiratory medications;To learn and understand importance of monitoring SPO2 with pulse oximeter and demonstrate accurate use of the pulse oximeter.;To learn and demonstrate proper pursed lip breathing techniques or other breathing techniques.        Long  Term Goals Exhibits compliance with exercise, home  and travel O2 prescription;Maintenance of O2 saturations>88%;Compliance with respiratory medication;Verbalizes importance of monitoring SPO2 with pulse oximeter and return demonstration;Exhibits proper breathing techniques, such as pursed lip breathing or other method taught during program session;Demonstrates proper use of MDI's       Goals/Expected Outcomes Compliance and understanding of oxygen saturation monitoring and breathing techniques to decrease shortness of breath.                 Oxygen Discharge (Final Oxygen Re-Evaluation):  Oxygen Re-Evaluation - 07/01/23 1046       Program Oxygen Prescription   Program Oxygen Prescription Continuous    Liters per minute 1      Home Oxygen   Home Oxygen Device Home Concentrator;Portable Concentrator    Sleep Oxygen Prescription Continuous    Liters per minute 2    Home Exercise Oxygen Prescription Pulsed    Liters per minute 4    Home Resting Oxygen Prescription Continuous    Liters per minute 2    Compliance with Home Oxygen Use Yes      Goals/Expected Outcomes   Short Term Goals To learn and exhibit compliance with exercise, home and travel O2 prescription;To learn and understand importance of maintaining oxygen saturations>88%;To learn and demonstrate proper use of respiratory medications;To learn and understand importance of monitoring SPO2 with pulse oximeter and demonstrate accurate use of the pulse oximeter.;To learn and demonstrate proper pursed lip breathing techniques or other breathing techniques.     Long  Term Goals Exhibits compliance with exercise, home  and travel O2 prescription;Maintenance of O2 saturations>88%;Compliance with respiratory medication;Verbalizes importance of monitoring SPO2 with pulse oximeter and return demonstration;Exhibits proper breathing techniques, such as pursed lip breathing or other method taught during program session;Demonstrates proper use of MDI's    Goals/Expected Outcomes Compliance and understanding of oxygen saturation monitoring and breathing techniques to decrease shortness of breath.  Initial Exercise Prescription:  Initial Exercise Prescription - 06/22/23 1200       Date of Initial Exercise RX and Referring Provider   Date 06/22/23    Referring Provider Hunsucker    Expected Discharge Date 09/15/23      Oxygen   Oxygen Continuous    Liters 1    Maintain Oxygen Saturation 88% or higher      Treadmill   MPH 2    Grade 0     Minutes 15      NuStep   Level 2    SPM 60    Minutes 15    METs 2.5      Prescription Details   Frequency (times per week) 2    Duration Progress to 30 minutes of continuous aerobic without signs/symptoms of physical distress      Intensity   THRR 40-80% of Max Heartrate 57-114    Ratings of Perceived Exertion 11-13    Perceived Dyspnea 0-4      Progression   Progression Continue to progress workloads to maintain intensity without signs/symptoms of physical distress.      Resistance Training   Training Prescription Yes    Weight red bands    Reps 10-15             Perform Capillary Blood Glucose checks as needed.  Exercise Prescription Changes:   Exercise Prescription Changes     Row Name 06/28/23 1200 07/12/23 1200           Response to Exercise   Blood Pressure (Admit) 102/54 104/56      Blood Pressure (Exercise) 108/66 138/66      Blood Pressure (Exit) 98/60 118/56      Heart Rate (Admit) 79 bpm 71 bpm      Heart Rate (Exercise) 89 bpm 95 bpm      Heart Rate (Exit) 70 bpm 70 bpm      Oxygen Saturation (Admit) 93 % 92 %      Oxygen Saturation (Exercise) 91 % 96 %      Oxygen Saturation (Exit) 94 % 94 %      Rating of Perceived Exertion (Exercise) 10 11      Perceived Dyspnea (Exercise) 0 0      Duration Progress to 30 minutes of  aerobic without signs/symptoms of physical distress Continue with 30 min of aerobic exercise without signs/symptoms of physical distress.      Intensity THRR unchanged THRR unchanged        Progression   Progression Continue to progress workloads to maintain intensity without signs/symptoms of physical distress. Continue to progress workloads to maintain intensity without signs/symptoms of physical distress.        Resistance Training   Training Prescription Yes Yes      Weight red bands red bands      Reps 10-15 10-15      Time 10 Minutes 10 Minutes        Oxygen   Oxygen Continuous Continuous      Liters 1 2-3         Treadmill   MPH 2 2.5      Grade 0 1      Minutes 15 15      METs 2.53 3.26        NuStep   Level 2 3      SPM 54 --      Minutes 15 15      METs 1.6 2.3  Oxygen   Maintain Oxygen Saturation 88% or higher 88% or higher               Exercise Comments:   Exercise Comments     Row Name 06/28/23 1555           Exercise Comments Pt completed first day of exercise. Chantee exercised for 15 min on the treadmill and Nustep. Breiona averaged 2.53 METs at 2 mph and 0 incline on the treadmill and 1.6 METs at level 2 on the Nustep. Brisha performed the warmup and cooldown holding onto a chair for balance. Discussed METs and how to increase METs.                Exercise Goals and Review:   Exercise Goals     Row Name 06/22/23 1125 07/01/23 1044           Exercise Goals   Increase Physical Activity Yes Yes      Intervention Provide advice, education, support and counseling about physical activity/exercise needs.;Develop an individualized exercise prescription for aerobic and resistive training based on initial evaluation findings, risk stratification, comorbidities and participant's personal goals. Provide advice, education, support and counseling about physical activity/exercise needs.;Develop an individualized exercise prescription for aerobic and resistive training based on initial evaluation findings, risk stratification, comorbidities and participant's personal goals.      Expected Outcomes Short Term: Attend rehab on a regular basis to increase amount of physical activity.;Long Term: Exercising regularly at least 3-5 days a week.;Long Term: Add in home exercise to make exercise part of routine and to increase amount of physical activity. Short Term: Attend rehab on a regular basis to increase amount of physical activity.;Long Term: Exercising regularly at least 3-5 days a week.;Long Term: Add in home exercise to make exercise part of routine and to increase amount of  physical activity.      Increase Strength and Stamina Yes Yes      Intervention Provide advice, education, support and counseling about physical activity/exercise needs.;Develop an individualized exercise prescription for aerobic and resistive training based on initial evaluation findings, risk stratification, comorbidities and participant's personal goals. Provide advice, education, support and counseling about physical activity/exercise needs.;Develop an individualized exercise prescription for aerobic and resistive training based on initial evaluation findings, risk stratification, comorbidities and participant's personal goals.      Expected Outcomes Short Term: Increase workloads from initial exercise prescription for resistance, speed, and METs.;Short Term: Perform resistance training exercises routinely during rehab and add in resistance training at home;Long Term: Improve cardiorespiratory fitness, muscular endurance and strength as measured by increased METs and functional capacity ( ) Short Term: Increase workloads from initial exercise prescription for resistance, speed, and METs.;Short Term: Perform resistance training exercises routinely during rehab and add in resistance training at home;Long Term: Improve cardiorespiratory fitness, muscular endurance and strength as measured by increased METs and functional capacity ( )      Able to understand and use rate of perceived exertion (RPE) scale Yes Yes      Intervention Provide education and explanation on how to use RPE scale Provide education and explanation on how to use RPE scale      Expected Outcomes Short Term: Able to use RPE daily in rehab to express subjective intensity level;Long Term:  Able to use RPE to guide intensity level when exercising independently Short Term: Able to use RPE daily in rehab to express subjective intensity level;Long Term:  Able to use RPE to guide intensity level when exercising independently  Able to  understand and use Dyspnea scale Yes Yes      Intervention Provide education and explanation on how to use Dyspnea scale Provide education and explanation on how to use Dyspnea scale      Expected Outcomes Short Term: Able to use Dyspnea scale daily in rehab to express subjective sense of shortness of breath during exertion;Long Term: Able to use Dyspnea scale to guide intensity level when exercising independently Short Term: Able to use Dyspnea scale daily in rehab to express subjective sense of shortness of breath during exertion;Long Term: Able to use Dyspnea scale to guide intensity level when exercising independently      Knowledge and understanding of Target Heart Rate Range (THRR) Yes Yes      Intervention Provide education and explanation of THRR including how the numbers were predicted and where they are located for reference Provide education and explanation of THRR including how the numbers were predicted and where they are located for reference      Expected Outcomes Short Term: Able to state/look up THRR;Long Term: Able to use THRR to govern intensity when exercising independently;Short Term: Able to use daily as guideline for intensity in rehab Short Term: Able to state/look up THRR;Long Term: Able to use THRR to govern intensity when exercising independently;Short Term: Able to use daily as guideline for intensity in rehab      Able to check pulse independently Yes Yes      Understanding of Exercise Prescription Yes Yes      Intervention Provide education, explanation, and written materials on patient's individual exercise prescription Provide education, explanation, and written materials on patient's individual exercise prescription      Expected Outcomes Short Term: Able to explain program exercise prescription;Long Term: Able to explain home exercise prescription to exercise independently Short Term: Able to explain program exercise prescription;Long Term: Able to explain home exercise  prescription to exercise independently               Exercise Goals Re-Evaluation :  Exercise Goals Re-Evaluation     Row Name 07/01/23 1044             Exercise Goal Re-Evaluation   Exercise Goals Review Increase Physical Activity;Able to understand and use Dyspnea scale;Understanding of Exercise Prescription;Increase Strength and Stamina;Knowledge and understanding of Target Heart Rate Range (THRR);Able to understand and use rate of perceived exertion (RPE) scale       Comments Adalin has completed 2 exercise sessions. She exercises for 15 min on the treadmill and Nustep. Danyal averages 2.15 METs at 1.5 mph and 0 incline on the treadmill and 1.6 MET at level 2 on the Nustep. She performs the warmup and cooldown standing holding onto a chair for balance. It is too soon to note any discernable progressions. Will continue to monitor and progress as able.       Expected Outcomes Through exercise at rehab and home, the patient will decrease shortness of breath with daily activities and feel confident in carrying out an exercise regimen at home.                Discharge Exercise Prescription (Final Exercise Prescription Changes):  Exercise Prescription Changes - 07/12/23 1200       Response to Exercise   Blood Pressure (Admit) 104/56    Blood Pressure (Exercise) 138/66    Blood Pressure (Exit) 118/56    Heart Rate (Admit) 71 bpm    Heart Rate (Exercise) 95 bpm    Heart  Rate (Exit) 70 bpm    Oxygen Saturation (Admit) 92 %    Oxygen Saturation (Exercise) 96 %    Oxygen Saturation (Exit) 94 %    Rating of Perceived Exertion (Exercise) 11    Perceived Dyspnea (Exercise) 0    Duration Continue with 30 min of aerobic exercise without signs/symptoms of physical distress.    Intensity THRR unchanged      Progression   Progression Continue to progress workloads to maintain intensity without signs/symptoms of physical distress.      Resistance Training   Training Prescription  Yes    Weight red bands    Reps 10-15    Time 10 Minutes      Oxygen   Oxygen Continuous    Liters 2-3      Treadmill   MPH 2.5    Grade 1    Minutes 15    METs 3.26      NuStep   Level 3    Minutes 15    METs 2.3      Oxygen   Maintain Oxygen Saturation 88% or higher             Nutrition:  Target Goals: Understanding of nutrition guidelines, daily intake of sodium 1500mg , cholesterol 200mg , calories 30% from fat and 7% or less from saturated fats, daily to have 5 or more servings of fruits and vegetables.  Biometrics:  Pre Biometrics - 06/22/23 1153       Pre Biometrics   Grip Strength 22 kg              Nutrition Therapy Plan and Nutrition Goals:  Nutrition Therapy & Goals - 06/28/23 1139       Nutrition Therapy   Diet Heart Healthy Diet    Drug/Food Interactions Statins/Certain Fruits      Personal Nutrition Goals   Nutrition Goal Patient to improve diet quality by using the plate method as a guide for meal planning to include lean protein/plant protein, fruits, vegetables, whole grains, nonfat dairy as part of a well-balanced diet.    Comments Diedra reports that she "does not eat well". She reports snacking throughout the day and eating 1-2 meals daily; she drinks hot tea with sugar and beer/ wine regularly. She often chooses frozen meals such as Stouffer's,etc. We did discuss benefits of reducing sugar intake and meeting calorie/nutrition needs for pulmonary disease. She does have good knowledge of low sodium diet recommendations. She remains pre-contemplative toward dietary changes at this time. Perrine will continue to benefit from participation in pulmonary rehab for nutrition and exercise support.      Intervention Plan   Intervention Prescribe, educate and counsel regarding individualized specific dietary modifications aiming towards targeted core components such as weight, hypertension, lipid management, diabetes, heart failure and other  comorbidities.;Nutrition handout(s) given to patient.    Expected Outcomes Short Term Goal: Understand basic principles of dietary content, such as calories, fat, sodium, cholesterol and nutrients.;Long Term Goal: Adherence to prescribed nutrition plan.             Nutrition Assessments:  MEDIFICTS Score Key: ?70 Need to make dietary changes  40-70 Heart Healthy Diet ? 40 Therapeutic Level Cholesterol Diet   Picture Your Plate Scores: <16 Unhealthy dietary pattern with much room for improvement. 41-50 Dietary pattern unlikely to meet recommendations for good health and room for improvement. 51-60 More healthful dietary pattern, with some room for improvement.  >60 Healthy dietary pattern, although there may be some specific  behaviors that could be improved.    Nutrition Goals Re-Evaluation:  Nutrition Goals Re-Evaluation     Row Name 06/28/23 1139             Goals   Current Weight 134 lb 4.2 oz (60.9 kg)       Comment A1c 6.1, Lipids WNL       Expected Outcome Erandy reports that she "does not eat well". She reports snacking throughout the day and eating 1-2 meals daily; she drinks hot tea with sugar and beer/ wine regularly. She often chooses frozen meals such as Stouffer's,etc. We did discuss benefits of reducing sugar intake and meeting calorie/nutrition needs for pulmonary disease. She does have good knowledge of low sodium diet recommendations. She remains pre-contemplative toward dietary changes at this time. Elleson will continue to benefit from participation in pulmonary rehab for nutrition and exercise support.                Nutrition Goals Discharge (Final Nutrition Goals Re-Evaluation):  Nutrition Goals Re-Evaluation - 06/28/23 1139       Goals   Current Weight 134 lb 4.2 oz (60.9 kg)    Comment A1c 6.1, Lipids WNL    Expected Outcome Conleigh reports that she "does not eat well". She reports snacking throughout the day and eating 1-2 meals daily; she  drinks hot tea with sugar and beer/ wine regularly. She often chooses frozen meals such as Stouffer's,etc. We did discuss benefits of reducing sugar intake and meeting calorie/nutrition needs for pulmonary disease. She does have good knowledge of low sodium diet recommendations. She remains pre-contemplative toward dietary changes at this time. Wynola will continue to benefit from participation in pulmonary rehab for nutrition and exercise support.             Psychosocial: Target Goals: Acknowledge presence or absence of significant depression and/or stress, maximize coping skills, provide positive support system. Participant is able to verbalize types and ability to use techniques and skills needed for reducing stress and depression.  Initial Review & Psychosocial Screening:  Initial Psych Review & Screening - 06/22/23 1120       Initial Review   Current issues with Current Stress Concerns;Current Anxiety/Panic    Source of Stress Concerns Chronic Illness;Family;Unable to participate in former interests or hobbies    Comments Pt identifies that she is dealing with alot of stress. Her husband's declining health as well as her declining health bring's her alot of stress and axiety.      Family Dynamics   Good Support System? Yes    Comments Pt has friends that provide her alot of support.      Barriers   Psychosocial barriers to participate in program The patient should benefit from training in stress management and relaxation.      Screening Interventions   Interventions Encouraged to exercise;To provide support and resources with identified psychosocial needs    Expected Outcomes Short Term goal: Utilizing psychosocial counselor, staff and physician to assist with identification of specific Stressors or current issues interfering with healing process. Setting desired goal for each stressor or current issue identified.;Long Term Goal: Stressors or current issues are controlled or  eliminated.;Short Term goal: Identification and review with participant of any Quality of Life or Depression concerns found by scoring the questionnaire.;Long Term goal: The participant improves quality of Life and PHQ9 Scores as seen by post scores and/or verbalization of changes             Quality  of Life Scores:  Scores of 19 and below usually indicate a poorer quality of life in these areas.  A difference of  2-3 points is a clinically meaningful difference.  A difference of 2-3 points in the total score of the Quality of Life Index has been associated with significant improvement in overall quality of life, self-image, physical symptoms, and general health in studies assessing change in quality of life.  PHQ-9: Review Flowsheet  More data exists      06/22/2023 06/15/2023 04/28/2023 12/22/2022 11/08/2022  Depression screen PHQ 2/9  Decreased Interest 0 0 0 0 0  Down, Depressed, Hopeless 1 0 0 0 0  PHQ - 2 Score 1 0 0 0 0  Altered sleeping 1 - - - -  Tired, decreased energy 3 - - - -  Change in appetite 0 - - - -  Feeling bad or failure about yourself  0 - - - -  Trouble concentrating 1 - - - -  Moving slowly or fidgety/restless 0 - - - -  Suicidal thoughts 0 - - - -  PHQ-9 Score 6 - - - -  Difficult doing work/chores Not difficult at all - - - -    Details           Interpretation of Total Score  Total Score Depression Severity:  1-4 = Minimal depression, 5-9 = Mild depression, 10-14 = Moderate depression, 15-19 = Moderately severe depression, 20-27 = Severe depression   Psychosocial Evaluation and Intervention:  Psychosocial Evaluation - 06/22/23 1122       Psychosocial Evaluation & Interventions   Interventions Stress management education;Encouraged to exercise with the program and follow exercise prescription    Comments Special identifies that she is dealing with alot of stress currently. She has had to place her husband in a nursing home. She is also worried about  her declining health. She has worked with a Paramedic in the past, but therapist told her she didn't need therapy. She declines medications and therapy at this time.    Expected Outcomes For Fanny to participate in PR free of any psychosocial barriers or concers.    Continue Psychosocial Services  Follow up required by staff             Psychosocial Re-Evaluation:  Psychosocial Re-Evaluation     Row Name 07/06/23 1549             Psychosocial Re-Evaluation   Current issues with Current Stress Concerns;Current Anxiety/Panic       Comments Carlas denies any new psychosocial barriers or concerns at this time. She is handling her stressors well at this time. Staff will educate Febe on healthy ways to deal with stress. She still denies a referral for a therapist or medications at this time.       Expected Outcomes For Paraskevi to participate in PR free of any psychosocial barriers or concerns.       Interventions Stress management education;Encouraged to attend Pulmonary Rehabilitation for the exercise       Continue Psychosocial Services  Follow up required by staff                Psychosocial Discharge (Final Psychosocial Re-Evaluation):  Psychosocial Re-Evaluation - 07/06/23 1549       Psychosocial Re-Evaluation   Current issues with Current Stress Concerns;Current Anxiety/Panic    Comments Candina denies any new psychosocial barriers or concerns at this time. She is handling her stressors well at this time. Staff will  educate Shataya on healthy ways to deal with stress. She still denies a referral for a therapist or medications at this time.    Expected Outcomes For Jesslyn to participate in PR free of any psychosocial barriers or concerns.    Interventions Stress management education;Encouraged to attend Pulmonary Rehabilitation for the exercise    Continue Psychosocial Services  Follow up required by staff             Education: Education Goals: Education classes will  be provided on a weekly basis, covering required topics. Participant will state understanding/return demonstration of topics presented.  Learning Barriers/Preferences:  Learning Barriers/Preferences - 06/22/23 1123       Learning Barriers/Preferences   Learning Barriers Sight;Hearing    Learning Preferences Group Instruction             Education Topics: Introduction to Pulmonary Rehab Group instruction provided by PowerPoint, verbal discussion, and written material to support subject matter. Instructor reviews what Pulmonary Rehab is, the purpose of the program, and how patients are referred.     Know Your Numbers Group instruction that is supported by a PowerPoint presentation. Instructor discusses importance of knowing and understanding resting, exercise, and post-exercise oxygen saturation, heart rate, and blood pressure. Oxygen saturation, heart rate, blood pressure, rating of perceived exertion, and dyspnea are reviewed along with a normal range for these values.    Exercise for the Pulmonary Patient Group instruction that is supported by a PowerPoint presentation. Instructor discusses benefits of exercise, core components of exercise, frequency, duration, and intensity of an exercise routine, importance of utilizing pulse oximetry during exercise, safety while exercising, and options of places to exercise outside of rehab.       MET Level  Group instruction provided by PowerPoint, verbal discussion, and written material to support subject matter. Instructor reviews what METs are and how to increase METs.    Pulmonary Medications Verbally interactive group education provided by instructor with focus on inhaled medications and proper administration.   Anatomy and Physiology of the Respiratory System Group instruction provided by PowerPoint, verbal discussion, and written material to support subject matter. Instructor reviews respiratory cycle and anatomical components of  the respiratory system and their functions. Instructor also reviews differences in obstructive and restrictive respiratory diseases with examples of each.    Oxygen Safety Group instruction provided by PowerPoint, verbal discussion, and written material to support subject matter. There is an overview of "What is Oxygen" and "Why do we need it".  Instructor also reviews how to create a safe environment for oxygen use, the importance of using oxygen as prescribed, and the risks of noncompliance. There is a brief discussion on traveling with oxygen and resources the patient may utilize.   Oxygen Use Group instruction provided by PowerPoint, verbal discussion, and written material to discuss how supplemental oxygen is prescribed and different types of oxygen supply systems. Resources for more information are provided.  Flowsheet Row PULMONARY REHAB OTHER RESPIRATORY from 06/30/2023 in [facility]  Date 06/30/23  Educator RT  Instruction Review Code 1- Verbalizes Understanding       Breathing Techniques Group instruction that is supported by demonstration and informational handouts. Instructor discusses the benefits of pursed lip and diaphragmatic breathing and detailed demonstration on how to perform both.  Flowsheet Row PULMONARY REHAB OTHER RESPIRATORY from 07/07/2023 in [facility] for [facility], V[facility], & Lung Health  Date 07/07/23  Educator RN  Instruction Review  Code 1- Verbalizes Understanding        Risk Factor Reduction Group instruction that is supported by a PowerPoint presentation. Instructor discusses the definition of a risk factor, different risk factors for pulmonary disease, and how the heart and lungs work together.   MD Day A group question and answer session with a medical doctor that allows participants to ask questions that relate to their pulmonary disease state.   Nutrition for the  Pulmonary Patient Group instruction provided by PowerPoint slides, verbal discussion, and written materials to support subject matter. The instructor gives an explanation and review of healthy diet recommendations, which includes a discussion on weight management, recommendations for fruit and vegetable consumption, as well as protein, fluid, caffeine, fiber, sodium, sugar, and alcohol. Tips for eating when patients are short of breath are discussed.    Other Education Group or individual verbal, written, or video instructions that support the educational goals of the pulmonary rehab program.    Knowledge Questionnaire Score:  Knowledge Questionnaire Score - 06/22/23 1153       Knowledge Questionnaire Score   Pre Score 17/18             Core Components/Risk Factors/Patient Goals at Admission:  Personal Goals and Risk Factors at Admission - 06/22/23 1123       Core Components/Risk Factors/Patient Goals on Admission    Weight Management Yes;Weight Loss    Intervention Weight Management: Develop a combined nutrition and exercise program designed to reach desired caloric intake, while maintaining appropriate intake of nutrient and fiber, sodium and fats, and appropriate energy expenditure required for the weight goal.;Weight Management: Provide education and appropriate resources to help participant work on and attain dietary goals.;Weight Management/Obesity: Establish reasonable short term and long term weight goals.;Obesity: Provide education and appropriate resources to help participant work on and attain dietary goals.    Expected Outcomes Weight Loss: Understanding of general recommendations for a balanced deficit meal plan, which promotes 1-2 lb weight loss per week and includes a negative energy balance of [phone] kcal/d;Understanding recommendations for meals to include 15-35% energy as protein, 25-35% energy from fat, 35-60% energy from carbohydrates, less than 200mg  of dietary  cholesterol, 20-35 gm of total fiber daily;Understanding of distribution of calorie intake throughout the day with the consumption of 4-5 meals/snacks    Improve shortness of breath with ADL's Yes    Intervention Provide education, individualized exercise plan and daily activity instruction to help decrease symptoms of SOB with activities of daily living.    Expected Outcomes Short Term: Improve cardiorespiratory fitness to achieve a reduction of symptoms when performing ADLs;Long Term: Be able to perform more ADLs without symptoms or delay the onset of symptoms    Increase knowledge of respiratory medications and ability to use respiratory devices properly  Yes    Intervention Provide education and demonstration as needed of appropriate use of medications, inhalers, and oxygen therapy.    Expected Outcomes Short Term: Achieves understanding of medications use. Understands that oxygen is a medication prescribed by physician. Demonstrates appropriate use of inhaler and oxygen therapy.;Long Term: Maintain appropriate use of medications, inhalers, and oxygen therapy.    Hypertension Yes    Intervention Provide education on lifestyle modifcations including regular physical activity/exercise, weight management, moderate sodium restriction and increased consumption of fresh fruit, vegetables, and low fat dairy, alcohol moderation, and smoking cessation.;Monitor prescription use compliance.    Expected Outcomes Short Term: Continued assessment and intervention until BP is < 140/68mm HG in hypertensive participants. <  130/9mm HG in hypertensive participants with diabetes, heart failure or chronic kidney disease.;Long Term: Maintenance of blood pressure at goal levels.    Stress Yes    Intervention Offer individual and/or small group education and counseling on adjustment to heart disease, stress management and health-related lifestyle change. Teach and support self-help strategies.;Refer participants  experiencing significant psychosocial distress to appropriate mental health specialists for further evaluation and treatment. When possible, include family members and significant others in education/counseling sessions.    Expected Outcomes Short Term: Participant demonstrates changes in health-related behavior, relaxation and other stress management skills, ability to obtain effective social support, and compliance with psychotropic medications if prescribed.;Long Term: Emotional wellbeing is indicated by absence of clinically significant psychosocial distress or social isolation.             Core Components/Risk Factors/Patient Goals Review:   Goals and Risk Factor Review     Row Name 07/06/23 1555             Core Components/Risk Factors/Patient Goals Review   Personal Goals Review Weight Management/Obesity;Improve shortness of breath with ADL's;Develop more efficient breathing techniques such as purse lipped breathing and diaphragmatic breathing and practicing self-pacing with activity.;Increase knowledge of respiratory medications and ability to use respiratory devices properly.;Hypertension;Stress       Review Sharlene Dory has only attended 3 classes so far. Goal for weight loss is progressing. Kamelia is working with staff dietician to achieve her weight loss goals. Goal progressing on improving her shortness of breath with ADLs. Goal progressing on developing more efficient breathing techniques such as purse lipped breathing and diaphragmatic breathing; and practicing self-pacing with activity. Goal progressing on managing stress. Goal progressing on increase knowledge of respiratory medications and ability to use respiratory devices properly. Goal progressing on hypertension. Lillyian states her B/P is up and down.  Staff has given Mariselda a blood pressure log to record her B/P's at home. She will follow up with her PCP. We will continue to monitor Meta's progress.       Expected Outcomes See  admission goals                Core Components/Risk Factors/Patient Goals at Discharge (Final Review):   Goals and Risk Factor Review - 07/06/23 1555       Core Components/Risk Factors/Patient Goals Review   Personal Goals Review Weight Management/Obesity;Improve shortness of breath with ADL's;Develop more efficient breathing techniques such as purse lipped breathing and diaphragmatic breathing and practicing self-pacing with activity.;Increase knowledge of respiratory medications and ability to use respiratory devices properly.;Hypertension;Stress    Review Sharlene Dory has only attended 3 classes so far. Goal for weight loss is progressing. Martesha is working with staff dietician to achieve her weight loss goals. Goal progressing on improving her shortness of breath with ADLs. Goal progressing on developing more efficient breathing techniques such as purse lipped breathing and diaphragmatic breathing; and practicing self-pacing with activity. Goal progressing on managing stress. Goal progressing on increase knowledge of respiratory medications and ability to use respiratory devices properly. Goal progressing on hypertension. Marley states her B/P is up and down.  Staff has given Teleah a blood pressure log to record her B/P's at home. She will follow up with her PCP. We will continue to monitor Cova's progress.    Expected Outcomes See admission goals             ITP Comments:Pt is making expected progress toward Pulmonary Rehab goals after completing 5 sessions. Recommend continued exercise, life style modification,  education, and utilization of breathing techniques to increase stamina and strength, while also decreasing shortness of breath with exertion.  Dr. Mechele Collin is Medical Director for Pulmonary Rehab at [facility].     Comments: Dr. Mechele Collin is Medical Director for Pulmonary Rehab at [facility].

## 2023-07-14 ENCOUNTER — Encounter (HOSPITAL_COMMUNITY)
Admission: RE | Admit: 2023-07-14 | Discharge: 2023-07-14 | Disposition: A | Discharge status: Home / Self Care | Payer: Medicare Other | Source: Ambulatory Visit | Attending: Pulmonary Disease | Admitting: Pulmonary Disease

## 2023-07-14 DIAGNOSIS — J439 Emphysema, unspecified: Secondary | ICD-10-CM

## 2023-07-14 NOTE — Progress Notes (Signed)
Daily Session Note  Patient Details  Name: DARLISHA BOOTY MRN: [id number] Date of Birth: [birth year] Referring Provider:   Doristine Devoid Pulmonary Rehab Walk Test from 06/22/2023 in [facility]  Referring Provider [place]       Encounter Date: 07/14/2023  Check In:  Session Check In - 07/14/23 1044       Check-In   Supervising physician immediately available to respond to emergencies [facility] MD immediately available    Physician(s) Joni Reining, NP    Location [facility]    Staff Present Raford Pitcher, MS, ACSM-CEP, Exercise Physiologist;Randi Dionisio Paschal, ACSM-CEP, Exercise Physiologist;Mary Gerre Scull, RN, BSN; Glenetta Borg, MS, Exercise Physiologist    Virtual Visit No    Medication changes reported     No    Fall or balance concerns reported    No    Tobacco Cessation No Change    Warm-up and Cool-down Performed as group-led instruction    Resistance Training Performed Yes    VAD Patient? No    PAD/SET Patient? No      Pain Assessment   Currently in Pain? No/denies    Multiple Pain Sites No             Capillary Blood Glucose: No results found. However, due to the size of the patient record, not all encounters were searched. Please check Results Review for a complete set of results.    Social History   Tobacco Use  Smoking Status Former   Current packs/day: 0.00   Types: Cigarettes   Quit date: 12/06/1958   Years since quitting: 64.6   Passive exposure: Never  Smokeless Tobacco Never    Goals Met:  Proper associated with RPD/PD & O2 Sat Independence with exercise equipment Exercise tolerated well No report of concerns or symptoms today Strength training completed today  Goals Unmet:  Not Applicable  Comments: Service time is from 1011 to 1145.    Dr. Mechele Collin is Medical Director for Pulmonary Rehab at [facility].

## 2023-07-18 DIAGNOSIS — M48061 Spinal stenosis, lumbar region without neurogenic claudication: Secondary | ICD-10-CM | POA: Diagnosis not present

## 2023-07-19 ENCOUNTER — Encounter (HOSPITAL_COMMUNITY)
Admission: RE | Admit: 2023-07-19 | Discharge: 2023-07-19 | Disposition: A | Discharge status: Home / Self Care | Payer: Medicare Other | Source: Ambulatory Visit | Attending: Pulmonary Disease | Admitting: Pulmonary Disease

## 2023-07-19 DIAGNOSIS — J439 Emphysema, unspecified: Secondary | ICD-10-CM

## 2023-07-19 NOTE — Progress Notes (Signed)
Daily Session Note  Patient Details  Name: Megan [name] MRN: [id number] Date of Birth: May 25, 1946 Referring Provider:   Doristine Devoid Pulmonary Rehab Walk Test from 06/22/2023 in [facility] for Heart, Vascular, & Lung Health  Referring Provider Hunsucker       Encounter Date: 07/19/2023  Check In:  Session Check In - 07/19/23 1105       Check-In   Supervising physician immediately available to respond to emergencies [facility] MD immediately available    Physician(s) Jari Favre, PA    [facility]    Staff Present Raford Pitcher, MS, ACSM-CEP, Exercise Physiologist;Randi Dionisio Paschal, ACSM-CEP, Exercise Physiologist; Gerre Scull, RN, Fuller Plan, RT    Virtual Visit No    Medication changes reported     No    Fall or balance concerns reported    No    Tobacco Cessation No Change    Warm-up and Cool-down Performed as group-led instruction    Resistance Training Performed Yes    VAD Patient? No    PAD/SET Patient? No      Pain Assessment   Currently in Pain? No/denies    Multiple Pain Sites No             Capillary Blood Glucose: No results found. However, due to the size of the patient record, not all encounters were searched. Please check Results Review for a complete set of results.    Social History   Tobacco Use  Smoking Status Former   Current packs/day: 0.00   Types: Cigarettes   Quit date: 12/06/1958   Years since quitting: 64.6   Passive exposure: Never  Smokeless Tobacco Never    Goals Met:  Independence with exercise equipment Exercise tolerated well No report of concerns or symptoms today Strength training completed today  Goals Unmet:  Not Applicable  Comments: Service time is from 1015 to 1131    Dr. Mechele Collin is Medical Director for Pulmonary Rehab at [facility].

## 2023-07-20 ENCOUNTER — Ambulatory Visit: Payer: Medicare Other | Admitting: Vascular Surgery

## 2023-07-20 ENCOUNTER — Encounter: Payer: Self-pay | Admitting: Vascular Surgery

## 2023-07-20 VITALS — BP 92/54 | HR 75 | Temp 97.3°F | Resp 16 | Ht 60.0 in | Wt 133.3 lb

## 2023-07-20 DIAGNOSIS — Z95828 Presence of other vascular implants and grafts: Secondary | ICD-10-CM | POA: Diagnosis not present

## 2023-07-20 DIAGNOSIS — R531 Weakness: Secondary | ICD-10-CM | POA: Diagnosis not present

## 2023-07-20 NOTE — Progress Notes (Signed)
Patient ID: Megan [name], female   DOB: September 24, 1946, 77 y.o.   MRN: [id number]  Reason for Consult: No chief complaint on file.   Referred by Willow Ora, MD  Subjective:     HPI:  Megan [name] is a 77 y.o. female history of endovascular aneurysm repair in 2009 and also a left common iliac artery stent and a right renal artery stent.  She denies any new back or abdominal pain.  She has had some breathing issues and more recently was on oxygen after a tick bite but otherwise has recovered well and is back to her usual state of activity and is currently in pulmonary rehab.  Past Medical History:  Diagnosis Date   AAA (abdominal aortic aneurysm) (HCC)    a. s/p stent grafting in 2009.   Adenomatous colon polyp 05/2001   Adenomatous duodenal polyp    Allergy    Anginal pain (HCC)    COPD (chronic obstructive pulmonary disease) (HCC)    pt denies COPD   Coronary artery disease    a. s/p CABG in 1995;  b. 05/2013 Neg MV, EF 82%;  c. 06/2013 Cath: LM nl, LAD 40-50p, LCX nl, RCA 80p/161m, VG->RCA->PDA->PLSA min irregs, LIMA->LAD atretic, vigorous LV fxn.   Diabetes mellitus without complication (HCC)    history of, resolved. 2017   Diverticulosis    Eczema, dyshidrotic    Eczema, dyshidrotic 1986   Dr. Gerilyn Nestle   Emphysema of lung [facility])    Fatty liver    Fibromyalgia    GERD (gastroesophageal reflux disease)    Hyperlipidemia    Hypertension    Migraine    Myocardial infarction [facility])    Osteoarthritis    Osteoporosis    unsure, having a bone scan soon   Peripheral arterial disease (HCC)    left leg diagnosed by Dr Patty Sermons   Renal artery stenosis [facility])    a. 2008 s/p PTA   Family History  Problem Relation Age of Onset   Heart disease Mother    Heart attack Mother    Heart disease Father        before age 28   Heart attack Father    Heart disease Sister        before age 8   Diabetes Sister    Hyperlipidemia Sister    Hypertension Sister    Heart attack  Sister    Liver cancer Sister    Breast cancer Sister    Heart disease Brother    Diabetes Brother    Hyperlipidemia Brother    Hypertension Brother    Heart attack Brother    AAA (abdominal aortic aneurysm) Brother    Colon cancer Paternal Grandmother    Heart disease Brother    Colon cancer Cousin        first cousin on father's side   Pancreatic cancer Neg Hx    Rectal cancer Neg Hx    Stomach cancer Neg Hx    Esophageal cancer Neg Hx    Past Surgical History:  Procedure Laterality Date   ABDOMINAL AORTAGRAM N/A 03/06/2014   Procedure: ABDOMINAL AORTAGRAM;  Surgeon: Larina Earthly, MD;  Location: [facility] CATH LAB;  Service: Cardiovascular;  Laterality: N/A;   ABDOMINAL AORTIC ANEURYSM REPAIR  2009   stenting   ABDOMINAL HYSTERECTOMY     Total, 1992   BREAST BIOPSY     CARDIAC CATHETERIZATION     CARDIOVASCULAR STRESS TEST  11/11/2009   normal study  CARPAL TUNNEL RELEASE  08/1993   left wrist   CARPAL TUNNEL RELEASE  01/1997   right   CATARACT EXTRACTION, BILATERAL  2021   CORONARY ARTERY BYPASS GRAFT  07/1994   Triple by Dr Ofilia Neas   CORONARY STENT INTERVENTION N/A 02/02/2021   Procedure: CORONARY STENT INTERVENTION;  Surgeon: Yvonne Kendall, MD;  [facility] INV[facility];  Service: Cardiovascular;  Laterality: N/A;   CORONARY STENT INTERVENTION N/A 06/26/2021   Procedure: CORONARY STENT INTERVENTION;  Surgeon: Tonny Bollman, MD;  Location: [facility] INVASIVE CV LAB;  Service: Cardiovascular;  Laterality: N/A;   CORONARY ULTRASOUND/IVUS N/A 06/26/2021   Procedure: Intravascular Ultrasound/IVUS;  Surgeon: Tonny Bollman, MD;  Location: [facility] INVASIVE CV LAB;  Service: Cardiovascular;  Laterality: N/A;   CORONARY ULTRASOUND/IVUS N/A 09/03/2021   Procedure: Intravascular Ultrasound/IVUS;  Surgeon: Orbie Pyo, MD;  [facility] INV[facility] LAB;  Service: Cardiovascular;  Laterality: N/A;   dental implants     EYE SURGERY  03/2020   cataract   HAND SURGERY Right 09/2002    Right hand pulley release   LAMINECTOMY WITH POSTERIOR LATERAL ARTHRODESIS LEVEL 1 N/A 03/24/2022   Procedure: LAMINECTOMY, FORAMINOTOMY, LUMBAR TWO- THREE ; INTERTRANSVERSE FUSION LUMBAR TWO - LUMBAR THREE RIGHT;  Surgeon: Tia Alert, MD;  Location: [facility] OR;  Service: Neurosurgery;  Laterality: N/A;   LEFT HEART CATH AND CORONARY ANGIOGRAPHY N/A 06/26/2021   Procedure: LEFT HEART CATH AND CORONARY ANGIOGRAPHY;  Surgeon: Tonny Bollman, MD;  Location: [facility] INVASIVE CV LAB;  Service: Cardiovascular;  Laterality: N/A;   LEFT HEART CATH AND CORS/GRAFTS ANGIOGRAPHY N/A 02/02/2021   Procedure: LEFT HEART CATH AND CORS/GRAFTS ANGIOGRAPHY;  Surgeon: Yvonne Kendall, MD;  [facility];  Service: Cardiovascular;  Laterality: N/A;   LEFT HEART CATH AND CORS/GRAFTS ANGIOGRAPHY N/A 09/03/2021   Procedure: LEFT HEART CATH AND CORS/GRAFTS ANGIOGRAPHY;  Surgeon: Orbie Pyo, MD;  [facility] INVAS[facility] CV LAB;  Service: Cardiovascular;  Laterality: N/A;   LEFT HEART CATH AND CORS/GRAFTS ANGIOGRAPHY N/A 07/15/2022   Procedure: LEFT HEART CATH AND CORS/GRAFTS ANGIOGRAPHY;  Surgeon: Swaziland, Peter M, MD;  Location: [facility] INVASIVE CV LAB;  Service: Cardiovascular;  Laterality: N/A;   LEFT HEART CATHETERIZATION WITH CORONARY/GRAFT ANGIOGRAM N/A 06/11/2013   Procedure: LEFT HEART CATHETERIZATION WITH Isabel Caprice;  Surgeon: Vesta Mixer, MD;  Location: [facility] CATH LAB;  Service: Cardiovascular;  Laterality: N/A;   MULTIPLE TOOTH EXTRACTIONS  2000   All upper teeth.  Has full dneture.    RENAL ARTERY STENT  2008   SHOULDER ARTHROSCOPY WITH ROTATOR CUFF REPAIR Right 09/27/2018   Procedure: Right shoulder mini open rotator cuff repair;  Surgeon: Jene Every, MD;  Location: [facility] ORS;  Service: Orthopedics;  Laterality: Right;  90 mins   thyroid cyst apiration  05/1997 and 07/1998   TONSILECTOMY, ADENOIDECTOMY, BILATERAL MYRINGOTOMY AND TUBES  1989   TONSILLECTOMY     TOTAL ABDOMINAL HYSTERECTOMY   1992    Short Social History:  Social History   Tobacco Use   Smoking status: Former    Current packs/day: 0.00    Types: Cigarettes    Quit date: 12/06/1958    Years since quitting: 64.6    Passive exposure: Never   Smokeless tobacco: Never  Substance Use Topics   Alcohol use: Yes    Alcohol/week: 6.0 - 10.0 standard drinks of alcohol    Types: 6 - 10 Glasses of wine per week    Comment: 2-3 glasses daily  Allergies  Allergen Reactions   Brilinta [Ticagrelor] Shortness Of Breath   Bextra [Valdecoxib] Other (See Comments)    Increased lft's    Hydrocod Poli-Chlorphe Poli Er Rash   Lipitor [Atorvastatin] Other (See Comments)    Leg weakness    Mevacor [Lovastatin] Other (See Comments)    Muscle weakness    Plavix [Clopidogrel Bisulfate] Itching and Other (See Comments)    Hands and feet tingle and itch   Zocor [Simvastatin] Other (See Comments)    Bones hurt    Doxycycline Diarrhea and Nausea And Vomiting   Metoprolol Other (See Comments)    Hair loss   Morphine And Codeine Itching and Nausea Only   Codeine Itching and Rash    Hyper-active   Lisinopril Cough    Current Outpatient Medications  Medication Sig Dispense Refill   Accu-Chek FastClix Lancets MISC Check blood sugar once daily PRN 100 each 1   albuterol (PROAIR HFA) 108 (90 Base) MCG/ACT inhaler Inhale 2 puffs into the lungs every 6 (six) hours as needed for wheezing or shortness of breath. 1 each 11   albuterol (PROVENTIL) (2.5 MG/3ML) 0.083% nebulizer solution Take 3 mLs (2.5 mg total) by nebulization every 6 (six) hours as needed for wheezing or shortness of breath. 360 mL 5   ALPRAZolam (XANAX) 0.25 MG tablet Take 1 tablet (0.25 mg total) by mouth at bedtime as needed for sleep. 90 tablet 3   amLODipine (NORVASC) 5 MG tablet Take 1 tablet (5 mg total) by mouth daily. 90 tablet 3   aspirin EC 81 MG tablet Take 1 tablet (81 mg total) by mouth daily. 90 tablet 3   atorvastatin (LIPITOR) 80 MG tablet  TAKE 1 TABLET BY MOUTH EVERY DAY 90 tablet 3   B Complex-Biotin-FA (B-100 COMPLEX PO) Take 1 tablet by mouth every evening.     bismuth subsalicylate (PEPTO BISMOL) 262 MG/15ML suspension Take 30 mLs by mouth every 6 (six) hours as needed for indigestion or diarrhea or loose stools.     Budeson-Glycopyrrol-Formoterol (BREZTRI AEROSPHERE) 160-9-4.8 MCG/ACT AERO Inhale 2 puffs into the lungs in the morning and at bedtime. 1 each 11   cetirizine (ZYRTEC) 10 MG tablet Take 10 mg by mouth daily as needed for allergies.     cholecalciferol (VITAMIN D3) 25 MCG (1000 UNIT) tablet Take 1,000 Units by mouth every evening.     CORDRAN 4 MCG/SQCM TAPE Apply 1 each topically daily as needed (Rash).      cycloSPORINE (RESTASIS) 0.05 % ophthalmic emulsion Place 1 drop into both eyes 2 (two) times daily.      Ergocalciferol 50 MCG (2000 UT) CAPS Take by mouth.     ezetimibe (ZETIA) 10 MG tablet TAKE 1 TABLET BY MOUTH EVERY DAY 90 tablet 3   gabapentin (NEURONTIN) 300 MG capsule Take 1 capsule (300 mg total) by mouth 2 (two) times daily. 270 capsule 3   glucose blood (ACCU-CHEK SMARTVIEW) test strip CHECK BLOOD SUGAR ONCE DAILY AS NEEDED 100 strip 1   hydrochlorothiazide (HYDRODIURIL) 12.5 MG tablet TAKE 1 TABLET BY MOUTH EVERY DAY 90 tablet 3   isosorbide mononitrate (IMDUR) 30 MG 24 hr tablet TAKE 3 TABLETS ( 90 MG ) EVERY MORNING AND 1 TABLET (30 MG ) EVERY EVENING 360 tablet 1   losartan (COZAAR) 100 MG tablet TAKE 1 TABLET BY MOUTH EVERY DAY 90 tablet 3   metoprolol succinate (TOPROL-XL) 25 MG 24 hr tablet TAKE 1 TABLET (25 MG TOTAL) BY MOUTH DAILY. 90 tablet 2  Multiple Vitamins-Minerals (MULTIVITAMIN WITH MINERALS) tablet Take 1 tablet by mouth every evening.     nitroGLYCERIN (NITROSTAT) 0.4 MG SL tablet Place 1 tablet (0.4 mg total) under the tongue every 5 (five) minutes as needed for chest pain. 25 tablet 11   omeprazole (PRILOSEC) 40 MG capsule Take 1 capsule (40 mg total) by mouth in the morning and  at bedtime. 180 capsule 3   ondansetron (ZOFRAN) 8 MG tablet Take 1 tablet (8 mg total) by mouth every 6 (six) hours as needed for nausea or vomiting. 20 tablet 0   oxyCODONE-acetaminophen (PERCOCET) 10-325 MG tablet Take 1 tablet by mouth in the morning and at bedtime. Can take one tablet three times a day as needed for back pain     polyethylene glycol (MIRALAX / GLYCOLAX) 17 g packet Take 17 g by mouth daily as needed for moderate constipation.     prasugrel (EFFIENT) 5 MG TABS tablet TAKE 1 TABLET (5 MG TOTAL) BY MOUTH DAILY. 90 tablet 3   PRESCRIPTION MEDICATION Place 2 puffs into both ears daily as needed (ear infections). [facility] ear powder     Simethicone (GAS-X PO) Take 1-2 tablets by mouth daily as needed (gas).     torsemide (DEMADEX) 20 MG tablet Take 1/2 tablet daily as needed for swelling 30 tablet 3   tretinoin (RETIN-A) 0.1 % cream Apply 1 application. topically every evening.     No current facility-administered medications for this visit.    Review of Systems  Constitutional:  Constitutional negative. HENT: HENT negative.  Respiratory: Positive for shortness of breath.  Cardiovascular: Cardiovascular negative.  GI: Gastrointestinal negative.  Musculoskeletal: Positive for back pain.  Skin: Skin negative.  Neurological: Neurological negative. Hematologic: Hematologic/lymphatic negative.  Psychiatric: Psychiatric negative.        Objective:  Objective  Vitals:   07/20/23 1037  BP: (!) 92/54  Pulse: 75  Resp: 16  Temp: (!) 97.3 F (36.3 C)  SpO2: 92%     Physical Exam HENT:     Head: Normocephalic.     Nose: Nose normal.     Mouth/Throat:     Mouth: Mucous membranes are moist.  Eyes:     Pupils: Pupils are equal, round, and reactive to light.  Cardiovascular:     Rate and Rhythm: Normal rate.     Pulses: Normal pulses.          Popliteal pulses are 2+ on the right side and 2+ on the left side.       Dorsalis pedis pulses are 2+ on the right side and 2+  on the left side.  Musculoskeletal:        General: Normal range of motion.     Right lower leg: No edema.     Left lower leg: No edema.  Skin:    General: Skin is warm and dry.     Capillary Refill: Capillary refill takes less than 2 seconds.  Neurological:     General: No focal deficit present.     Mental Status: She is alert.     Data: CTA IMPRESSION: Redemonstration of EVAR for repair of infrarenal abdominal aortic aneurysm, with no evidence of endoleak or other late complicating features.   Patent left iliac stent extension without in stent stenosis.   Patent right renal artery stent without evidence of in stent stenosis.   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 Aortic Atherosclerosis (ICD10-I70.0).     Assessment/Plan:    77 year old female with endovascular aneurysm repair now 15 years ago with decreased size of her aneurysm originally was 5.8 cm now measuring about 4.0 without evidence of endoleak.  As such we will follow-up in 2 years with EVAR duplex unless she has issues prior.  All questions answered today.     Maeola Harman MD [facility]

## 2023-07-21 ENCOUNTER — Encounter (HOSPITAL_COMMUNITY)
Admission: RE | Admit: 2023-07-21 | Discharge: 2023-07-21 | Disposition: A | Discharge status: Home / Self Care | Payer: Medicare Other | Source: Ambulatory Visit | Attending: Pulmonary Disease | Admitting: Pulmonary Disease

## 2023-07-21 DIAGNOSIS — J439 Emphysema, unspecified: Secondary | ICD-10-CM | POA: Diagnosis not present

## 2023-07-21 NOTE — Progress Notes (Signed)
Daily Session Note  Patient Details  Name: Megan [name] MRN: [id number] Date of Birth: [birth year] Referring Provider:   Doristine Devoid Pulmonary Rehab Walk Test from 06/22/2023 in [facility] for Heart, Vascular, & Lung Health  Referring Provider Hunsucker       Encounter Date: 07/21/2023  Check In:  Session Check In - 07/21/23 1207       Check-In   Supervising physician immediately available to respond to emergencies [facility] MD immediately available    Physician(s) Edd Fabian, NP    Location [facility]    Staff Present Raford Pitcher, MS, ACSM-CEP, Exercise Physiologist;Randi Dionisio Paschal, ACSM-CEP, Exercise Physiologist;Mary Gerre Scull, RN, Fuller Plan, RT    Virtual Visit No    Medication changes reported     No    Fall or balance concerns reported    No    Tobacco Cessation No Change    Warm-up and Cool-down Performed as group-led instruction    Resistance Training Performed Yes    VAD Patient? No    PAD/SET Patient? No      Pain Assessment   Currently in Pain? No/denies    Pain Score 0-No pain    Multiple Pain Sites No             Capillary Blood Glucose: No results found. However, due to the size of the patient record, not all encounters were searched. Please check Results Review for a complete set of results.    Social History   Tobacco Use  Smoking Status Former   Current packs/day: 0.00   Types: Cigarettes   Quit date: 12/06/1958   Years since quitting: 64.6   Passive exposure: Never  Smokeless Tobacco Never    Goals Met:  Proper associated with RPD/PD & O2 Sat Independence with exercise equipment Exercise tolerated well No report of concerns or symptoms today Strength training completed today  Goals Unmet:  Not Applicable  Comments: Service time is from 1010 to 1141.    Dr. Mechele Collin is Medical Director for Pulmonary Rehab at [facility]

## 2023-07-24 DIAGNOSIS — J9601 Acute respiratory failure with hypoxia: Secondary | ICD-10-CM | POA: Diagnosis not present

## 2023-07-24 DIAGNOSIS — J4489 Other specified chronic obstructive pulmonary disease: Secondary | ICD-10-CM | POA: Diagnosis not present

## 2023-07-26 ENCOUNTER — Encounter (HOSPITAL_COMMUNITY): Admission: RE | Admit: 2023-07-26 | Payer: Medicare Other | Source: Ambulatory Visit

## 2023-07-26 VITALS — Wt 133.2 lb

## 2023-07-26 DIAGNOSIS — J439 Emphysema, unspecified: Secondary | ICD-10-CM

## 2023-07-26 NOTE — Progress Notes (Signed)
Daily Session Note  Patient Details  Name: Megan [name] MRN: [id number] Date of Birth: 10-29-1946 Referring Provider:   Doristine Devoid Pulmonary Rehab Walk Test from 06/22/2023 in [facility] for Heart, Vascular, & Lung Health  Referring Provider Hunsucker       Encounter Date: 07/26/2023  Check In:  Session Check In - 07/26/23 1111       Check-In   Supervising physician immediately available to respond to emergencies [facility] MD immediately available    Physician(s) Edd Fabian, NP    [facility]    Staff Present Raford Pitcher, MS, ACSM-CEP, Exercise Physiologist;Samantha Belarus, RD, Dutch Gray, RN, Fuller Plan, RT    Virtual Visit No    Medication changes reported     No    Fall or balance concerns reported    No    Tobacco Cessation No Change    Warm-up and Cool-down Performed as group-led instruction    Resistance Training Performed Yes    VAD Patient? No    PAD/SET Patient? No      Pain Assessment   Currently in Pain? No/denies    Multiple Pain Sites No             Capillary Blood Glucose: No results found. However, due to the size of the patient record, not all encounters were searched. Please check Results Review for a complete set of results.   Exercise Prescription Changes - 07/26/23 1200       Response to Exercise   Blood Pressure (Admit) 102/58    Blood Pressure (Exercise) 150/52    Blood Pressure (Exit) 118/56    Heart Rate (Admit) 74 bpm    Heart Rate (Exercise) 90 bpm    Heart Rate (Exit) 75 bpm    Oxygen Saturation (Admit) 93 %    Oxygen Saturation (Exercise) 90 %    Oxygen Saturation (Exit) 91 %    Rating of Perceived Exertion (Exercise) 11    Perceived Dyspnea (Exercise) 1    Duration Continue with 30 min of aerobic exercise without signs/symptoms of physical distress.    Intensity THRR unchanged      Progression   Progression Continue to progress workloads to maintain intensity  without signs/symptoms of physical distress.      Resistance Training   Training Prescription Yes    Weight red bands    Reps 10-15    Time 10 Minutes      Oxygen   Oxygen Continuous    Liters 2      Treadmill   MPH 2.5    Grade 1    Minutes 15    METs 3.26      NuStep   Level 4    Minutes 15    METs 2.9      Oxygen   Maintain Oxygen Saturation 88% or higher             Social History   Tobacco Use  Smoking Status Former   Current packs/day: 0.00   Types: Cigarettes   Quit date: 12/06/1958   Years since quitting: 64.6   Passive exposure: Never  Smokeless Tobacco Never    Goals Met:  Proper associated with RPD/PD & O2 Sat Exercise tolerated well No report of concerns or symptoms today Strength training completed today  Goals Unmet:  Not Applicable  Comments: Service time is from 1016 to 1138.    Dr. Mechele Collin is Medical Director for Pulmonary Rehab at  [facility].

## 2023-07-28 ENCOUNTER — Encounter (HOSPITAL_COMMUNITY)
Admission: RE | Admit: 2023-07-28 | Discharge: 2023-07-28 | Disposition: A | Discharge status: Home / Self Care | Payer: Medicare Other | Source: Ambulatory Visit | Attending: Pulmonary Disease | Admitting: Pulmonary Disease

## 2023-07-28 DIAGNOSIS — J439 Emphysema, unspecified: Secondary | ICD-10-CM

## 2023-07-28 NOTE — Progress Notes (Signed)
Daily Session Note  Patient Details  Name: Megan [name] MRN: [id number] Date of Birth: 11/23/1946 Referring Provider:   Doristine Devoid Pulmonary Rehab Walk Test from 06/22/2023 in [facility] for Heart, Vascular, & Lung Health  Referring Provider Hunsucker       Encounter Date: 07/28/2023  Check In:  Session Check In - 07/28/23 1104       Check-In   Supervising physician immediately available to respond to emergencies [facility] MD immediately available    Physician(s) Neila Gear, NP    Location [facility]    Staff Present Raford Pitcher, MS, ACSM-CEP, Exercise Physiologist;Samantha Belarus, RD, Dutch Gray, RN, BSN;Aerik Polan, RT;Randi Reeve BS, ACSM-CEP, Exercise Physiologist    Virtual Visit No    Medication changes reported     No    Fall or balance concerns reported    No    Tobacco Cessation No Change    Warm-up and Cool-down Performed as group-led instruction    Resistance Training Performed Yes    VAD Patient? No    PAD/SET Patient? No      Pain Assessment   Currently in Pain? No/denies    Pain Score 0-No pain    Multiple Pain Sites No             Capillary Blood Glucose: No results found. However, due to the size of the patient record, not all encounters were searched. Please check Results Review for a complete set of results.    Social History   Tobacco Use  Smoking Status Former   Current packs/day: 0.00   Types: Cigarettes   Quit date: 12/06/1958   Years since quitting: 64.6   Passive exposure: Never  Smokeless Tobacco Never    Goals Met:  Proper associated with RPD/PD & O2 Sat Independence with exercise equipment Exercise tolerated well No report of concerns or symptoms today Strength training completed today  Goals Unmet:  Not Applicable  Comments: Service time is from 1011 to 1135.    Dr. Mechele Collin is Medical Director for Pulmonary Rehab at [facility].

## 2023-08-01 ENCOUNTER — Ambulatory Visit: Payer: Medicare Other | Admitting: Cardiology

## 2023-08-01 ENCOUNTER — Other Ambulatory Visit: Payer: Self-pay | Admitting: Gastroenterology

## 2023-08-02 ENCOUNTER — Encounter (HOSPITAL_COMMUNITY)
Admission: RE | Admit: 2023-08-02 | Discharge: 2023-08-02 | Disposition: A | Discharge status: Home / Self Care | Payer: Medicare Other | Source: Ambulatory Visit | Attending: Pulmonary Disease | Admitting: Pulmonary Disease

## 2023-08-02 DIAGNOSIS — J439 Emphysema, unspecified: Secondary | ICD-10-CM | POA: Diagnosis not present

## 2023-08-02 NOTE — Progress Notes (Signed)
Daily Session Note  Patient Details  Name: Megan [name] MRN: [id number] Date of Birth: Jun 29, 1946 Referring Provider:   Doristine Devoid Pulmonary Rehab Walk Test from 06/22/2023 in [facility] for Heart, Vascular, & Lung Health  Referring Provider Hunsucker       Encounter Date: 08/02/2023  Check In:  Session Check In - 08/02/23 1215       Check-In   Supervising physician immediately available to respond to emergencies [facility] MD immediately available    Physician(s) Carlos Levering, NP    Location [facility]    Staff Present Raford Pitcher, MS, ACSM-CEP, Exercise Physiologist;Samantha Belarus, RD, Dutch Gray, RN, BSN;Casey Smith, RT;Randi Reeve BS, ACSM-CEP, Exercise Physiologist    Virtual Visit No    Medication changes reported     No    Fall or balance concerns reported    No    Tobacco Cessation No Change    Warm-up and Cool-down Performed as group-led instruction    Resistance Training Performed Yes    VAD Patient? No    PAD/SET Patient? No      Pain Assessment   Currently in Pain? No/denies    Pain Score 0-No pain    Multiple Pain Sites No             Capillary Blood Glucose: No results found. However, due to the size of the patient record, not all encounters were searched. Please check Results Review for a complete set of results.    Social History   Tobacco Use  Smoking Status Former   Current packs/day: 0.00   Types: Cigarettes   Quit date: 12/06/1958   Years since quitting: 64.6   Passive exposure: Never  Smokeless Tobacco Never    Goals Met:  Proper associated with RPD/PD & O2 Sat Exercise tolerated well No report of concerns or symptoms today Strength training completed today  Goals Unmet:  Not Applicable  Comments: Service time is from 1015 to 1146.    Dr. Mechele Collin is Medical Director for Pulmonary Rehab at [facility]

## 2023-08-04 ENCOUNTER — Encounter (HOSPITAL_COMMUNITY)
Admission: RE | Admit: 2023-08-04 | Discharge: 2023-08-04 | Disposition: A | Discharge status: Home / Self Care | Payer: Medicare Other | Source: Ambulatory Visit | Attending: Pulmonary Disease | Admitting: Pulmonary Disease

## 2023-08-04 DIAGNOSIS — J439 Emphysema, unspecified: Secondary | ICD-10-CM | POA: Diagnosis not present

## 2023-08-04 NOTE — Progress Notes (Signed)
Home Exercise Prescription I have reviewed a Home Exercise Prescription with Carolynn Serve. Judie is currently exercising at home. She walks on the treadmill 7 days/wk for 30 min/day. Luane is exceeding minimum exercise guidelines. She has a great understanding of exercise guidelines and knows the importance of a home exercise regimen. Callista mentioned walking outside when the weather is nicer. She understands weather guidelines. I am confident in [place] completing an exercise regimen at home. She is motivated to exercise and improve her functional capacity. The patient stated that their goals were to get back to walking outside. We reviewed exercise guidelines, target heart rate during exercise, RPE Scale, weather conditions, endpoints for exercise, warmup and cool down. The patient is encouraged to come to me with any questions. I will continue to follow up with the patient to assist them with progression and safety. Spent 15 min with patient discussing home exercise plan and goals  Joya San, MS, ACSM-CEP 08/04/2023 3:24 PM

## 2023-08-04 NOTE — Progress Notes (Signed)
Daily Session Note  Patient Details  Name: Megan [name] MRN: [id number] Date of Birth: 1946-09-18 Referring Provider:   Doristine Devoid Pulmonary Rehab Walk Test from 06/22/2023 in [facility]  Referring Provider [place]       Encounter Date: 08/04/2023  Check In:  Session Check In - 08/04/23 1136       Check-In   Supervising physician immediately available to respond to emergencies [facility] MD immediately available    Physician(s) Bernadene Person, NP    Location [facility]    Staff Present Raford Pitcher, MS, ACSM-CEP, Exercise Physiologist;Casey Erin Sons BS, ACSM-CEP, Exercise Physiologist;Maria Whitaker, RN, BSN    Virtual Visit No    Medication changes reported     No    Fall or balance concerns reported    No    Tobacco Cessation No Change    Warm-up and Cool-down Performed as group-led instruction    Resistance Training Performed Yes    VAD Patient? No    PAD/SET Patient? No      Pain Assessment   Currently in Pain? No/denies             Capillary Blood Glucose: No results found. However, due to the size of the patient record, not all encounters were searched. Please check Results Review for a complete set of results.    Social History   Tobacco Use  Smoking Status Former   Current packs/day: 0.00   Types: Cigarettes   Quit date: 12/06/1958   Years since quitting: 64.7   Passive exposure: Never  Smokeless Tobacco Never    Goals Met:  Independence with exercise equipment Exercise tolerated well No report of concerns or symptoms today Strength training completed today  Goals Unmet:  Not Applicable  Comments: Service time is from 1017 to 1153.    Dr. Mechele Collin is Medical Director for Pulmonary Rehab at [facility].

## 2023-08-08 ENCOUNTER — Other Ambulatory Visit: Payer: Self-pay | Admitting: Cardiology

## 2023-08-09 ENCOUNTER — Encounter (HOSPITAL_COMMUNITY)
Admission: RE | Admit: 2023-08-09 | Discharge: 2023-08-09 | Disposition: A | Discharge status: Home / Self Care | Payer: Medicare Other | Source: Ambulatory Visit | Attending: Pulmonary Disease | Admitting: Pulmonary Disease

## 2023-08-09 VITALS — Wt 132.3 lb

## 2023-08-09 DIAGNOSIS — J439 Emphysema, unspecified: Secondary | ICD-10-CM | POA: Insufficient documentation

## 2023-08-09 NOTE — Progress Notes (Signed)
Daily Session Note  Patient Details  Name: Megan [name] MRN: [id number] Date of Birth: 12-Apr-1946 Referring Provider:   Doristine Devoid Pulmonary Rehab Walk Test from 06/22/2023 in [facility]  Referring Provider [place]       Encounter Date: 08/09/2023  Check In:  Session Check In - 08/09/23 1129       Check-In   Supervising physician immediately available to respond to emergencies [facility] MD immediately available    Physician(s) Bernadene Person, NP    Location [facility]    Staff Present Raford Pitcher, MS, ACSM-CEP, Exercise Physiologist;Casey Erin Sons BS, ACSM-CEP, Exercise Physiologist;Samantha Belarus, RD, [place], RN, BSN    Virtual Visit No    Medication changes reported     No    Fall or balance concerns reported    No    Tobacco Cessation No Change    Warm-up and Cool-down Performed as group-led Writer Performed Yes    VAD Patient? No    PAD/SET Patient? No      Pain Assessment   Currently in Pain? No/denies    Multiple Pain Sites No             Capillary Blood Glucose: No results found. However, due to the size of the patient record, not all encounters were searched. Please check Results Review for a complete set of results.   Exercise Prescription Changes - 08/09/23 1100       Response to Exercise   Blood Pressure (Admit) 108/54    Blood Pressure (Exercise) 144/62    Blood Pressure (Exit) 110/62    Heart Rate (Admit) 68 bpm    Heart Rate (Exercise) 85 bpm    Heart Rate (Exit) 78 bpm    Oxygen Saturation (Admit) 96 %    Oxygen Saturation (Exercise) 92 %    Oxygen Saturation (Exit) 93 %    Rating of Perceived Exertion (Exercise) 11    Perceived Dyspnea (Exercise) 1    Duration Continue with 30 min of aerobic exercise without signs/symptoms of physical distress.    Intensity THRR unchanged      Progression   Progression Continue to  progress workloads to maintain intensity without signs/symptoms of physical distress.      Resistance Training   Training Prescription Yes    Weight blue bands    Reps 10-15    Time 10 Minutes      Oxygen   Oxygen Continuous    Liters 2      Treadmill   MPH 2.7    Grade 1    Minutes 15    METs 3.44      NuStep   Level 4    Minutes 15    METs 2.9      Oxygen   Maintain Oxygen Saturation 88% or higher             Social History   Tobacco Use  Smoking Status Former   Current packs/day: 0.00   Types: Cigarettes   Quit date: 12/06/1958   Years since quitting: 64.7   Passive exposure: Never  Smokeless Tobacco Never    Goals Met:  Independence with exercise equipment Exercise tolerated well No report of concerns or symptoms today Strength training completed today  Goals Unmet:  Not Applicable  Comments: Service time is from 1021 to 1145.    Dr. Mechele Collin is Medical Director for Pulmonary  Rehab at [facility].

## 2023-08-10 NOTE — Progress Notes (Signed)
Pulmonary Individual Treatment Plan  Patient Details  Name: Megan [name] MRN: [id number] Date of Birth: 21-Aug-1946 Referring Provider:   Doristine Devoid Pulmonary Rehab Walk Test from 06/22/2023 in [facility]  Referring Provider Hunsucker       Initial Encounter Date:  Flowsheet Row Pulmonary Rehab Walk Test from 06/22/2023 in [facility]  Date 06/22/23       Visit Diagnosis: Pulmonary emphysema, unspecified emphysema type (HCC)  Patient's Home Medications on Admission:   Current Outpatient Medications:    Accu-Chek FastClix Lancets MISC, Check blood sugar once daily PRN, Disp: 100 each, Rfl: 1   albuterol (PROAIR HFA) 108 (90 Base) MCG/ACT inhaler, Inhale 2 puffs into the lungs every 6 (six) hours as needed for wheezing or shortness of breath., Disp: 1 each, Rfl: 11   albuterol (PROVENTIL) (2.5 MG/3ML) 0.083% nebulizer solution, Take 3 mLs (2.5 mg total) by nebulization every 6 (six) hours as needed for wheezing or shortness of breath., Disp: 360 mL, Rfl: 5   ALPRAZolam (XANAX) 0.25 MG tablet, Take 1 tablet (0.25 mg total) by mouth at bedtime as needed for sleep., Disp: 90 tablet, Rfl: 3   amLODipine (NORVASC) 5 MG tablet, Take 1 tablet (5 mg total) by mouth daily., Disp: 90 tablet, Rfl: 3   aspirin EC 81 MG tablet, Take 1 tablet (81 mg total) by mouth daily., Disp: 90 tablet, Rfl: 3   atorvastatin (LIPITOR) 80 MG tablet, TAKE 1 TABLET BY MOUTH EVERY DAY, Disp: 90 tablet, Rfl: 3   B Complex-Biotin-FA (B-100 COMPLEX PO), Take 1 tablet by mouth every evening., Disp: , Rfl:    bismuth subsalicylate (PEPTO BISMOL) 262 MG/15ML suspension, Take 30 mLs by mouth every 6 (six) hours as needed for indigestion or diarrhea or loose stools., Disp: , Rfl:    Budeson-Glycopyrrol-Formoterol (BREZTRI AEROSPHERE) 160-9-4.8 MCG/ACT AERO, Inhale 2 puffs into the lungs in the morning and  at bedtime., Disp: 1 each, Rfl: 11   cetirizine (ZYRTEC) 10 MG tablet, Take 10 mg by mouth daily as needed for allergies., Disp: , Rfl:    cholecalciferol (VITAMIN D3) 25 MCG (1000 UNIT) tablet, Take 1,000 Units by mouth every evening., Disp: , Rfl:    CORDRAN 4 MCG/SQCM TAPE, Apply 1 each topically daily as needed (Rash). , Disp: , Rfl:    cycloSPORINE (RESTASIS) 0.05 % ophthalmic emulsion, Place 1 drop into both eyes 2 (two) times daily. , Disp: , Rfl:    Ergocalciferol 50 MCG (2000 UT) CAPS, Take by mouth., Disp: , Rfl:    ezetimibe (ZETIA) 10 MG tablet, TAKE 1 TABLET BY MOUTH EVERY DAY, Disp: 90 tablet, Rfl: 3   gabapentin (NEURONTIN) 300 MG capsule, Take 1 capsule (300 mg total) by mouth 2 (two) times daily., Disp: 270 capsule, Rfl: 3   glucose blood (ACCU-CHEK SMARTVIEW) test strip, CHECK BLOOD SUGAR ONCE DAILY AS NEEDED, Disp: 100 strip, Rfl: 1   hydrochlorothiazide (HYDRODIURIL) 12.5 MG tablet, TAKE 1 TABLET BY MOUTH EVERY DAY, Disp: 90 tablet, Rfl: 3   isosorbide mononitrate (IMDUR) 30 MG 24 hr tablet, TAKE 3 TABLETS ( 90 MG ) EVERY MORNING AND 1 TABLET (30 MG ) EVERY EVENING, Disp: 360 tablet, Rfl: 1   losartan (COZAAR) 100 MG tablet, TAKE 1 TABLET BY MOUTH EVERY DAY, Disp: 90 tablet, Rfl: 3   metoprolol succinate (TOPROL-XL) 25 MG 24 hr tablet, TAKE 1 TABLET (25 MG TOTAL) BY  MOUTH DAILY., Disp: 90 tablet, Rfl: 2   Multiple Vitamins-Minerals (MULTIVITAMIN WITH MINERALS) tablet, Take 1 tablet by mouth every evening., Disp: , Rfl:    nitroGLYCERIN (NITROSTAT) 0.4 MG SL tablet, Place 1 tablet (0.4 mg total) under the tongue every 5 (five) minutes as needed for chest pain., Disp: 25 tablet, Rfl: 11   omeprazole (PRILOSEC) 40 MG capsule, Take 1 capsule (40 mg total) by mouth in the morning and at bedtime., Disp: 180 capsule, Rfl: 3   ondansetron (ZOFRAN) 8 MG tablet, Take 1 tablet (8 mg total) by mouth every 6 (six) hours as needed for nausea or vomiting., Disp: 20 tablet, Rfl: 0    oxyCODONE-acetaminophen (PERCOCET) 10-325 MG tablet, Take 1 tablet by mouth in the morning and at bedtime. Can take one tablet three times a day as needed for back pain, Disp: , Rfl:    polyethylene glycol (MIRALAX / GLYCOLAX) 17 g packet, Take 17 g by mouth daily as needed for moderate constipation., Disp: , Rfl:    prasugrel (EFFIENT) 5 MG TABS tablet, TAKE 1 TABLET (5 MG TOTAL) BY MOUTH DAILY., Disp: 90 tablet, Rfl: 3   PRESCRIPTION MEDICATION, Place 2 puffs into both ears daily as needed (ear infections). [facility] ear powder, Disp: , Rfl:    Simethicone (GAS-X PO), Take 1-2 tablets by mouth daily as needed (gas)., Disp: , Rfl:    torsemide (DEMADEX) 20 MG tablet, Take 1/2 tablet daily as needed for swelling, Disp: 30 tablet, Rfl: 3   tretinoin (RETIN-A) 0.1 % cream, Apply 1 application. topically every evening., Disp: , Rfl:   Past Medical History: Past Medical History:  Diagnosis Date   AAA (abdominal aortic aneurysm) (HCC)    a. s/p stent grafting in 2009.   Adenomatous colon polyp 05/2001   Adenomatous duodenal polyp    Allergy    Anginal pain (HCC)    COPD (chronic obstructive pulmonary disease) (HCC)    pt denies COPD   Coronary artery disease    a. s/p CABG in 1995;  b. 05/2013 Neg MV, EF 82%;  c. 06/2013 Cath: LM nl, LAD 40-50p, LCX nl, RCA 80p/177m, VG->RCA->PDA->PLSA min irregs, LIMA->LAD atretic, vigorous LV fxn.   Diabetes mellitus without complication (HCC)    history of, resolved. 2017   Diverticulosis    Eczema, dyshidrotic    Eczema, dyshidrotic 1986   Dr. Gerilyn Nestle   Emphysema of lung [facility])    Fatty liver    Fibromyalgia    GERD (gastroesophageal reflux disease)    Hyperlipidemia    Hypertension    Migraine    Myocardial infarction [facility])    Osteoarthritis    Osteoporosis    unsure, having a bone scan soon   Peripheral arterial disease (HCC)    left leg diagnosed by Dr Patty Sermons   Renal artery stenosis [facility])    a. 2008 s/p PTA    Tobacco Use: Social History    Tobacco Use  Smoking Status Former   Current packs/day: 0.00   Types: Cigarettes   Quit date: 12/06/1958   Years since quitting: 64.7   Passive exposure: Never  Smokeless Tobacco Never    Labs: Review Flowsheet  More data exists      Latest Ref Rng & Units 06/25/2021 01/06/2022 04/05/2022 06/21/2022 12/22/2022  Labs for ITP Cardiac and Pulmonary Rehab  Cholestrol 0 - 200 mg/dL 161  096  045  - 409   LDL (calc) 0 - 99 mg/dL 73  74  57  -  58   HDL-C >39.00 mg/dL 49  52  51  - 40.98   Trlycerides 0.0 - 149.0 mg/dL 119  147  829  - 562.1   Hemoglobin A1c 4.6 - 6.5 % - 5.8  - 5.8  6.1  5.7     Details       Multiple values from one day are sorted in reverse-chronological order         Capillary Blood Glucose: Lab Results  Component Value Date   GLUCAP 102 (H) 03/24/2022   GLUCAP 91 09/03/2021   GLUCAP 113 (H) 02/03/2021   GLUCAP 91 02/03/2021   GLUCAP 125 (H) 02/02/2021     Pulmonary Assessment Scores:  Pulmonary Assessment Scores     Row Name 06/22/23 1128         ADL UCSD   ADL Phase Entry     SOB Score total 74       CAT Score   CAT Score 31       mMRC Score   mMRC Score 2             UCSD: Self-administered rating of dyspnea associated with activities of daily living (ADLs) 6-point scale (0 = "not at all" to 5 = "maximal or unable to do because of breathlessness")  Scoring Scores range from 0 to 120.  Minimally important difference is 5 units  CAT: CAT can identify the health impairment of COPD patients and is better correlated with disease progression.  CAT has a scoring range of zero to 40. The CAT score is classified into four groups of low (less than 10), medium (10 - 20), high (21-30) and very high (31-40) based on the impact level of disease on health status. A CAT score over 10 suggests significant symptoms.  A worsening CAT score could be explained by an exacerbation, poor medication adherence, poor inhaler technique, or progression of COPD or  comorbid conditions.  CAT MCID is 2 points  mMRC: mMRC (Modified Medical Research Council) Dyspnea Scale is used to assess the degree of baseline functional disability in patients of respiratory disease due to dyspnea. No minimal important difference is established. A decrease in score of 1 point or greater is considered a positive change.   Pulmonary Function Assessment:  Pulmonary Function Assessment - 06/22/23 1127       Breath   Bilateral Breath Sounds Rhonchi;Decreased    Shortness of Breath Limiting activity;Yes;Panic with Shortness of Breath             Exercise Target Goals: Exercise Program Goal: Individual exercise prescription set using results from initial 6 min walk test and THRR while considering  patient's activity barriers and safety.   Exercise Prescription Goal: Initial exercise prescription builds to 30-45 minutes a day of aerobic activity, 2-3 days per week.  Home exercise guidelines will be given to patient during program as part of exercise prescription that the participant will acknowledge.  Activity Barriers & Risk Stratification:  Activity Barriers & Cardiac Risk Stratification - 06/22/23 1124       Activity Barriers & Cardiac Risk Stratification   Activity Barriers Deconditioning;Muscular Weakness;Shortness of Breath;Back Problems;Balance Concerns;Arthritis;History of Falls;Assistive Device   walk with cane   Cardiac Risk Stratification High             6 Minute Walk:  6 Minute Walk     Row Name 06/22/23 1029         6 Minute Walk   Phase Initial  Distance 780 feet     Walk Time 6 minutes     # of Rest Breaks 0     MPH 1.48     METS 1.32     RPE 6.5     Perceived Dyspnea  0     VO2 Peak 4.61     Symptoms No     Resting HR 69 bpm     Resting BP 102/52     Resting Oxygen Saturation  91 %     Exercise Oxygen Saturation  during 6 min walk 86 %     Max Ex. HR 82 bpm     Max Ex. BP 138/66     2 Minute Post BP 120/54        Interval HR   1 Minute HR 77     2 Minute HR 80     3 Minute HR 82     4 Minute HR 79     5 Minute HR 79     6 Minute HR 80     2 Minute Post HR 71     Interval Heart Rate? Yes       Interval Oxygen   Interval Oxygen? Yes     Baseline Oxygen Saturation % 91 %     1 Minute Oxygen Saturation % 93 %     1 Minute Liters of Oxygen 0 L     2 Minute Oxygen Saturation % 88 %     2 Minute Liters of Oxygen 0 L     3 Minute Oxygen Saturation % 86 %  2:46     3 Minute Liters of Oxygen 0 L  increased to 1L     4 Minute Oxygen Saturation % 90 %     4 Minute Liters of Oxygen 1 L     5 Minute Oxygen Saturation % 89 %     5 Minute Liters of Oxygen 1 L     6 Minute Oxygen Saturation % 91 %     6 Minute Liters of Oxygen 1 L     2 Minute Post Oxygen Saturation % 98 %     2 Minute Post Liters of Oxygen 1 L              Oxygen Initial Assessment:  Oxygen Initial Assessment - 06/22/23 1126       Home Oxygen   Home Oxygen Device Home Concentrator;Portable Concentrator    Sleep Oxygen Prescription Continuous    Liters per minute 2    Home Exercise Oxygen Prescription Pulsed    Liters per minute 4    Home Resting Oxygen Prescription Continuous    Liters per minute 2    Compliance with Home Oxygen Use Yes      Initial 6 min Walk   Oxygen Used Continuous    Liters per minute 1      Program Oxygen Prescription   Program Oxygen Prescription Continuous    Liters per minute 1      Intervention   Short Term Goals To learn and exhibit compliance with exercise, home and travel O2 prescription;To learn and understand importance of maintaining oxygen saturations>88%;To learn and demonstrate proper use of respiratory medications;To learn and understand importance of monitoring SPO2 with pulse oximeter and demonstrate accurate use of the pulse oximeter.;To learn and demonstrate proper pursed lip breathing techniques or other breathing techniques.     Long  Term Goals Exhibits compliance with  exercise, home  and  travel O2 prescription;Maintenance of O2 saturations>88%;Compliance with respiratory medication;Verbalizes importance of monitoring SPO2 with pulse oximeter and return demonstration;Exhibits proper breathing techniques, such as pursed lip breathing or other method taught during program session;Demonstrates proper use of MDI's             Oxygen Re-Evaluation:  Oxygen Re-Evaluation     Row Name 07/01/23 1046 08/03/23 1111           Program Oxygen Prescription   Program Oxygen Prescription Continuous Continuous      Liters per minute 1 1        Home Oxygen   Home Oxygen Device Home Concentrator;Portable Concentrator Home Concentrator;Portable Concentrator      Sleep Oxygen Prescription Continuous Continuous      Liters per minute 2 2      Home Exercise Oxygen Prescription Pulsed Pulsed      Liters per minute 4 4      Home Resting Oxygen Prescription Continuous Continuous      Liters per minute 2 2      Compliance with Home Oxygen Use Yes Yes        Goals/Expected Outcomes   Short Term Goals To learn and exhibit compliance with exercise, home and travel O2 prescription;To learn and understand importance of maintaining oxygen saturations>88%;To learn and demonstrate proper use of respiratory medications;To learn and understand importance of monitoring SPO2 with pulse oximeter and demonstrate accurate use of the pulse oximeter.;To learn and demonstrate proper pursed lip breathing techniques or other breathing techniques.  To learn and exhibit compliance with exercise, home and travel O2 prescription;To learn and understand importance of maintaining oxygen saturations>88%;To learn and demonstrate proper use of respiratory medications;To learn and understand importance of monitoring SPO2 with pulse oximeter and demonstrate accurate use of the pulse oximeter.;To learn and demonstrate proper pursed lip breathing techniques or other breathing techniques.       Long  Term  Goals Exhibits compliance with exercise, home  and travel O2 prescription;Maintenance of O2 saturations>88%;Compliance with respiratory medication;Verbalizes importance of monitoring SPO2 with pulse oximeter and return demonstration;Exhibits proper breathing techniques, such as pursed lip breathing or other method taught during program session;Demonstrates proper use of MDI's Exhibits compliance with exercise, home  and travel O2 prescription;Maintenance of O2 saturations>88%;Compliance with respiratory medication;Verbalizes importance of monitoring SPO2 with pulse oximeter and return demonstration;Exhibits proper breathing techniques, such as pursed lip breathing or other method taught during program session;Demonstrates proper use of MDI's      Goals/Expected Outcomes Compliance and understanding of oxygen saturation monitoring and breathing techniques to decrease shortness of breath. Compliance and understanding of oxygen saturation monitoring and breathing techniques to decrease shortness of breath.               Oxygen Discharge (Final Oxygen Re-Evaluation):  Oxygen Re-Evaluation - 08/03/23 1111       Program Oxygen Prescription   Program Oxygen Prescription Continuous    Liters per minute 1      Home Oxygen   Home Oxygen Device Home Concentrator;Portable Concentrator    Sleep Oxygen Prescription Continuous    Liters per minute 2    Home Exercise Oxygen Prescription Pulsed    Liters per minute 4    Home Resting Oxygen Prescription Continuous    Liters per minute 2    Compliance with Home Oxygen Use Yes      Goals/Expected Outcomes   Short Term Goals To learn and exhibit compliance with exercise, home and travel O2 prescription;To learn and understand  importance of maintaining oxygen saturations>88%;To learn and demonstrate proper use of respiratory medications;To learn and understand importance of monitoring SPO2 with pulse oximeter and demonstrate accurate use of the pulse  oximeter.;To learn and demonstrate proper pursed lip breathing techniques or other breathing techniques.     Long  Term Goals Exhibits compliance with exercise, home  and travel O2 prescription;Maintenance of O2 saturations>88%;Compliance with respiratory medication;Verbalizes importance of monitoring SPO2 with pulse oximeter and return demonstration;Exhibits proper breathing techniques, such as pursed lip breathing or other method taught during program session;Demonstrates proper use of MDI's    Goals/Expected Outcomes Compliance and understanding of oxygen saturation monitoring and breathing techniques to decrease shortness of breath.             Initial Exercise Prescription:  Initial Exercise Prescription - 06/22/23 1200       Date of Initial Exercise RX and Referring Provider   Date 06/22/23    Referring Provider Hunsucker    Expected Discharge Date 09/15/23      Oxygen   Oxygen Continuous    Liters 1    Maintain Oxygen Saturation 88% or higher      Treadmill   MPH 2    Grade 0    Minutes 15      NuStep   Level 2    SPM 60    Minutes 15    METs 2.5      Prescription Details   Frequency (times per week) 2    Duration Progress to 30 minutes of continuous aerobic without signs/symptoms of physical distress      Intensity   THRR 40-80% of Max Heartrate 57-114    Ratings of Perceived Exertion 11-13    Perceived Dyspnea 0-4      Progression   Progression Continue to progress workloads to maintain intensity without signs/symptoms of physical distress.      Resistance Training   Training Prescription Yes    Weight red bands    Reps 10-15             Perform Capillary Blood Glucose checks as needed.  Exercise Prescription Changes:   Exercise Prescription Changes     Row Name 06/28/23 1200 07/12/23 1200 07/26/23 1200 08/09/23 1100       Response to Exercise   Blood Pressure (Admit) 102/54 104/56 102/58 108/54    Blood Pressure (Exercise) 108/66 138/66  150/52 144/62    Blood Pressure (Exit) 98/60 118/56 118/56 110/62    Heart Rate (Admit) 79 bpm 71 bpm 74 bpm 68 bpm    Heart Rate (Exercise) 89 bpm 95 bpm 90 bpm 85 bpm    Heart Rate (Exit) 70 bpm 70 bpm 75 bpm 78 bpm    Oxygen Saturation (Admit) 93 % 92 % 93 % 96 %    Oxygen Saturation (Exercise) 91 % 96 % 90 % 92 %    Oxygen Saturation (Exit) 94 % 94 % 91 % 93 %    Rating of Perceived Exertion (Exercise) 10 11 11 11     Perceived Dyspnea (Exercise) 0 0 1 1    Duration Progress to 30 minutes of  aerobic without signs/symptoms of physical distress Continue with 30 min of aerobic exercise without signs/symptoms of physical distress. Continue with 30 min of aerobic exercise without signs/symptoms of physical distress. Continue with 30 min of aerobic exercise without signs/symptoms of physical distress.    Intensity THRR unchanged THRR unchanged THRR unchanged THRR unchanged      Progression  Progression Continue to progress workloads to maintain intensity without signs/symptoms of physical distress. Continue to progress workloads to maintain intensity without signs/symptoms of physical distress. Continue to progress workloads to maintain intensity without signs/symptoms of physical distress. Continue to progress workloads to maintain intensity without signs/symptoms of physical distress.      Resistance Training   Training Prescription Yes Yes Yes Yes    Weight red bands red bands red bands blue bands    Reps 10-15 10-15 10-15 10-15    Time 10 Minutes 10 Minutes 10 Minutes 10 Minutes      Oxygen   Oxygen Continuous Continuous Continuous Continuous    Liters 1 2-3 2 2       Treadmill   MPH 2 2.5 2.5 2.7    Grade 0 1 1 1     Minutes 15 15 15 15     METs 2.53 3.26 3.26 3.44      NuStep   Level 2 3 4 4     SPM 54 -- -- --    Minutes 15 15 15 15     METs 1.6 2.3 2.9 2.9      Oxygen   Maintain Oxygen Saturation 88% or higher 88% or higher 88% or higher 88% or higher              Exercise Comments:   Exercise Comments     Row Name 06/28/23 1555 08/04/23 1516         Exercise Comments Pt completed first day of exercise. Yuvia exercised for 15 min on the treadmill and Nustep. Erva averaged 2.53 METs at 2 mph and 0 incline on the treadmill and 1.6 METs at level 2 on the Nustep. Fonnie performed the warmup and cooldown holding onto a chair for balance. Discussed METs and how to increase METs. Completed home ExRx. Jeune is currently exercising at home. She walks on the treadmill 7 days/wk for 30 min/day. Karin is exceeding minimum exercise guidelines. She has a great understanding of exercise guidelines and knows the importance of a home exercise regimen. Malyn mentioned walking outside when the weather is nicer. She understands weather guidelines. I am confident in Talent completing an exercise regimen at home. She is motivated to exercise and improve her functional capacity.               Exercise Goals and Review:   Exercise Goals     Row Name 06/22/23 1125 07/01/23 1044 08/03/23 1058         Exercise Goals   Increase Physical Activity Yes Yes Yes     Intervention Provide advice, education, support and counseling about physical activity/exercise needs.;Develop an individualized exercise prescription for aerobic and resistive training based on initial evaluation findings, risk stratification, comorbidities and participant's personal goals. Provide advice, education, support and counseling about physical activity/exercise needs.;Develop an individualized exercise prescription for aerobic and resistive training based on initial evaluation findings, risk stratification, comorbidities and participant's personal goals. Provide advice, education, support and counseling about physical activity/exercise needs.;Develop an individualized exercise prescription for aerobic and resistive training based on initial evaluation findings, risk stratification, comorbidities  and participant's personal goals.     Expected Outcomes Short Term: Attend rehab on a regular basis to increase amount of physical activity.;Long Term: Exercising regularly at least 3-5 days a week.;Long Term: Add in home exercise to make exercise part of routine and to increase amount of physical activity. Short Term: Attend rehab on a regular basis to increase amount of physical activity.;Long Term: Exercising  regularly at least 3-5 days a week.;Long Term: Add in home exercise to make exercise part of routine and to increase amount of physical activity. Short Term: Attend rehab on a regular basis to increase amount of physical activity.;Long Term: Exercising regularly at least 3-5 days a week.;Long Term: Add in home exercise to make exercise part of routine and to increase amount of physical activity.     Increase Strength and Stamina Yes Yes Yes     Intervention Provide advice, education, support and counseling about physical activity/exercise needs.;Develop an individualized exercise prescription for aerobic and resistive training based on initial evaluation findings, risk stratification, comorbidities and participant's personal goals. Provide advice, education, support and counseling about physical activity/exercise needs.;Develop an individualized exercise prescription for aerobic and resistive training based on initial evaluation findings, risk stratification, comorbidities and participant's personal goals. Provide advice, education, support and counseling about physical activity/exercise needs.;Develop an individualized exercise prescription for aerobic and resistive training based on initial evaluation findings, risk stratification, comorbidities and participant's personal goals.     Expected Outcomes Short Term: Increase workloads from initial exercise prescription for resistance, speed, and METs.;Short Term: Perform resistance training exercises routinely during rehab and add in resistance training at  home;Long Term: Improve cardiorespiratory fitness, muscular endurance and strength as measured by increased METs and functional capacity ( ) Short Term: Increase workloads from initial exercise prescription for resistance, speed, and METs.;Short Term: Perform resistance training exercises routinely during rehab and add in resistance training at home;Long Term: Improve cardiorespiratory fitness, muscular endurance and strength as measured by increased METs and functional capacity ( ) Short Term: Increase workloads from initial exercise prescription for resistance, speed, and METs.;Short Term: Perform resistance training exercises routinely during rehab and add in resistance training at home;Long Term: Improve cardiorespiratory fitness, muscular endurance and strength as measured by increased METs and functional capacity ( )     Able to understand and use rate of perceived exertion (RPE) scale Yes Yes Yes     Intervention Provide education and explanation on how to use RPE scale Provide education and explanation on how to use RPE scale Provide education and explanation on how to use RPE scale     Expected Outcomes Short Term: Able to use RPE daily in rehab to express subjective intensity level;Long Term:  Able to use RPE to guide intensity level when exercising independently Short Term: Able to use RPE daily in rehab to express subjective intensity level;Long Term:  Able to use RPE to guide intensity level when exercising independently Short Term: Able to use RPE daily in rehab to express subjective intensity level;Long Term:  Able to use RPE to guide intensity level when exercising independently     Able to understand and use Dyspnea scale Yes Yes Yes     Intervention Provide education and explanation on how to use Dyspnea scale Provide education and explanation on how to use Dyspnea scale Provide education and explanation on how to use Dyspnea scale     Expected Outcomes Short Term: Able to use  Dyspnea scale daily in rehab to express subjective sense of shortness of breath during exertion;Long Term: Able to use Dyspnea scale to guide intensity level when exercising independently Short Term: Able to use Dyspnea scale daily in rehab to express subjective sense of shortness of breath during exertion;Long Term: Able to use Dyspnea scale to guide intensity level when exercising independently Short Term: Able to use Dyspnea scale daily in rehab to express subjective sense of shortness of breath during exertion;Long Term:  Able to use Dyspnea scale to guide intensity level when exercising independently     Knowledge and understanding of Target Heart Rate Range (THRR) Yes Yes Yes     Intervention Provide education and explanation of THRR including how the numbers were predicted and where they are located for reference Provide education and explanation of THRR including how the numbers were predicted and where they are located for reference Provide education and explanation of THRR including how the numbers were predicted and where they are located for reference     Expected Outcomes Short Term: Able to state/look up THRR;Long Term: Able to use THRR to govern intensity when exercising independently;Short Term: Able to use daily as guideline for intensity in rehab Short Term: Able to state/look up THRR;Long Term: Able to use THRR to govern intensity when exercising independently;Short Term: Able to use daily as guideline for intensity in rehab Short Term: Able to state/look up THRR;Long Term: Able to use THRR to govern intensity when exercising independently;Short Term: Able to use daily as guideline for intensity in rehab     Able to check pulse independently Yes Yes --     Understanding of Exercise Prescription Yes Yes Yes     Intervention Provide education, explanation, and written materials on patient's individual exercise prescription Provide education, explanation, and written materials on patient's  individual exercise prescription Provide education, explanation, and written materials on patient's individual exercise prescription     Expected Outcomes Short Term: Able to explain program exercise prescription;Long Term: Able to explain home exercise prescription to exercise independently Short Term: Able to explain program exercise prescription;Long Term: Able to explain home exercise prescription to exercise independently Short Term: Able to explain program exercise prescription;Long Term: Able to explain home exercise prescription to exercise independently              Exercise Goals Re-Evaluation :  Exercise Goals Re-Evaluation     Row Name 07/01/23 1044 08/03/23 1100           Exercise Goal Re-Evaluation   Exercise Goals Review Increase Physical Activity;Able to understand and use Dyspnea scale;Understanding of Exercise Prescription;Increase Strength and Stamina;Knowledge and understanding of Target Heart Rate Range (THRR);Able to understand and use rate of perceived exertion (RPE) scale Increase Physical Activity;Able to understand and use Dyspnea scale;Understanding of Exercise Prescription;Increase Strength and Stamina;Knowledge and understanding of Target Heart Rate Range (THRR);Able to understand and use rate of perceived exertion (RPE) scale      Comments Aunna has completed 2 exercise sessions. She exercises for 15 min on the treadmill and Nustep. Dorie averages 2.15 METs at 1.5 mph and 0 incline on the treadmill and 1.6 MET at level 2 on the Nustep. She performs the warmup and cooldown standing holding onto a chair for balance. It is too soon to note any discernable progressions. Will continue to monitor and progress as able. Lakyia has completed 11 exercise sessions. She exercises for 15 min on the treadmill and Nustep. Mauricia averages 3.44 METs at 2.7 mph and 1 incline on the treadmill and 2.6 MET at level 4 on the Nustep. She performs the warmup and cooldown standing holding  onto a chair for balance. She has increased her workload for both exercise modes. Her speed had increased from 2.5 to 2.7 mph and incline has increased for 0-1. Her level on the Nustep has increased from 2 to 4. Ida has tolerated these increases well. Will continue to monitor and progress as able.  Expected Outcomes Through exercise at rehab and home, the patient will decrease shortness of breath with daily activities and feel confident in carrying out an exercise regimen at home. Through exercise at rehab and home, the patient will decrease shortness of breath with daily activities and feel confident in carrying out an exercise regimen at home.               Discharge Exercise Prescription (Final Exercise Prescription Changes):  Exercise Prescription Changes - 08/09/23 1100       Response to Exercise   Blood Pressure (Admit) 108/54    Blood Pressure (Exercise) 144/62    Blood Pressure (Exit) 110/62    Heart Rate (Admit) 68 bpm    Heart Rate (Exercise) 85 bpm    Heart Rate (Exit) 78 bpm    Oxygen Saturation (Admit) 96 %    Oxygen Saturation (Exercise) 92 %    Oxygen Saturation (Exit) 93 %    Rating of Perceived Exertion (Exercise) 11    Perceived Dyspnea (Exercise) 1    Duration Continue with 30 min of aerobic exercise without signs/symptoms of physical distress.    Intensity THRR unchanged      Progression   Progression Continue to progress workloads to maintain intensity without signs/symptoms of physical distress.      Resistance Training   Training Prescription Yes    Weight blue bands    Reps 10-15    Time 10 Minutes      Oxygen   Oxygen Continuous    Liters 2      Treadmill   MPH 2.7    Grade 1    Minutes 15    METs 3.44      NuStep   Level 4    Minutes 15    METs 2.9      Oxygen   Maintain Oxygen Saturation 88% or higher             Nutrition:  Target Goals: Understanding of nutrition guidelines, daily intake of sodium 1500mg , cholesterol  200mg , calories 30% from fat and 7% or less from saturated fats, daily to have 5 or more servings of fruits and vegetables.  Biometrics:  Pre Biometrics - 06/22/23 1153       Pre Biometrics   Grip Strength 22 kg              Nutrition Therapy Plan and Nutrition Goals:  Nutrition Therapy & Goals - 07/26/23 1128       Nutrition Therapy   Diet Heart Healthy Diet    Drug/Food Interactions Statins/Certain Fruits      Personal Nutrition Goals   Nutrition Goal Patient to improve diet quality by using the plate method as a guide for meal planning to include lean protein/plant protein, fruits, vegetables, whole grains, nonfat dairy as part of a well-balanced diet.   goal not met.   Personal Goal #2 Patient to reduce sodium intake 2300mg  per day   goal in progress.   Comments Claryssa remains precontemplative toward significant nutrition changes. She has met goals for sodium intake. Teanna reports that she "does not eat well". She reports snacking throughout the day and eating 1-2 meals daily and over eating refined sugar foods; she drinks hot tea with sugar and beer/ wine regularly. She often chooses frozen meals such as Stouffer's,etc. Chastelyn continues to monitor sodium intake and weigh regularly.Daveigh has maintained her weight since starting with our program; BMI remains appropriate for age. Verlee will continue to benefit from  participation in pulmonary rehab for nutrition and exercise support.      Intervention Plan   Intervention Prescribe, educate and counsel regarding individualized specific dietary modifications aiming towards targeted core components such as weight, hypertension, lipid management, diabetes, heart failure and other comorbidities.;Nutrition handout(s) given to patient.    Expected Outcomes Short Term Goal: Understand basic principles of dietary content, such as calories, fat, sodium, cholesterol and nutrients.;Long Term Goal: Adherence to prescribed nutrition plan.              Nutrition Assessments:  MEDIFICTS Score Key: ?70 Need to make dietary changes  40-70 Heart Healthy Diet ? 40 Therapeutic Level Cholesterol Diet   Picture Your Plate Scores: <60 Unhealthy dietary pattern with much room for improvement. 41-50 Dietary pattern unlikely to meet recommendations for good health and room for improvement. 51-60 More healthful dietary pattern, with some room for improvement.  >60 Healthy dietary pattern, although there may be some specific behaviors that could be improved.    Nutrition Goals Re-Evaluation:  Nutrition Goals Re-Evaluation     Row Name 06/28/23 1139 07/26/23 1128           Goals   Current Weight 134 lb 4.2 oz (60.9 kg) 133 lb 2.5 oz (60.4 kg)      Comment A1c 6.1, Lipids WNL no new labs; most recent labs A1c 6.1, Lipids WNL      Expected Outcome Clelia reports that she "does not eat well". She reports snacking throughout the day and eating 1-2 meals daily; she drinks hot tea with sugar and beer/ wine regularly. She often chooses frozen meals such as Stouffer's,etc. We did discuss benefits of reducing sugar intake and meeting calorie/nutrition needs for pulmonary disease. She does have good knowledge of low sodium diet recommendations. She remains pre-contemplative toward dietary changes at this time. Garda will continue to benefit from participation in pulmonary rehab for nutrition and exercise support. Anel remains precontemplative toward significant nutrition changes. She has met goals for sodium intake. Mallery reports that she "does not eat well". She reports snacking throughout the day and eating 1-2 meals daily and over eating refined sugar foods; she drinks hot tea with sugar and beer/ wine regularly. She often chooses frozen meals such as Stouffer's,etc. Adriona continues to monitor sodium intake and weigh regularly.Tanja has maintained her weight since starting with our program; BMI remains appropriate for age. Solangel will  continue to benefit from participation in pulmonary rehab for nutrition and exercise support.               Nutrition Goals Discharge (Final Nutrition Goals Re-Evaluation):  Nutrition Goals Re-Evaluation - 07/26/23 1128       Goals   Current Weight 133 lb 2.5 oz (60.4 kg)    Comment no new labs; most recent labs A1c 6.1, Lipids WNL    Expected Outcome Lashawne remains precontemplative toward significant nutrition changes. She has met goals for sodium intake. Faduma reports that she "does not eat well". She reports snacking throughout the day and eating 1-2 meals daily and over eating refined sugar foods; she drinks hot tea with sugar and beer/ wine regularly. She often chooses frozen meals such as Stouffer's,etc. Giannina continues to monitor sodium intake and weigh regularly.Shakerria has maintained her weight since starting with our program; BMI remains appropriate for age. Oniesha will continue to benefit from participation in pulmonary rehab for nutrition and exercise support.             Psychosocial: Target Goals: Acknowledge  presence or absence of significant depression and/or stress, maximize coping skills, provide positive support system. Participant is able to verbalize types and ability to use techniques and skills needed for reducing stress and depression.  Initial Review & Psychosocial Screening:  Initial Psych Review & Screening - 06/22/23 1120       Initial Review   Current issues with Current Stress Concerns;Current Anxiety/Panic    Source of Stress Concerns Chronic Illness;Family;Unable to participate in former interests or hobbies    Comments Pt identifies that she is dealing with alot of stress. Her husband's declining health as well as her declining health bring's her alot of stress and axiety.      Family Dynamics   Good Support System? Yes    Comments Pt has friends that provide her alot of support.      Barriers   Psychosocial barriers to participate in program  The patient should benefit from training in stress management and relaxation.      Screening Interventions   Interventions Encouraged to exercise;To provide support and resources with identified psychosocial needs    Expected Outcomes Short Term goal: Utilizing psychosocial counselor, staff and physician to assist with identification of specific Stressors or current issues interfering with healing process. Setting desired goal for each stressor or current issue identified.;Long Term Goal: Stressors or current issues are controlled or eliminated.;Short Term goal: Identification and review with participant of any Quality of Life or Depression concerns found by scoring the questionnaire.;Long Term goal: The participant improves quality of Life and PHQ9 Scores as seen by post scores and/or verbalization of changes             Quality of Life Scores:  Scores of 19 and below usually indicate a poorer quality of life in these areas.  A difference of  2-3 points is a clinically meaningful difference.  A difference of 2-3 points in the total score of the Quality of Life Index has been associated with significant improvement in overall quality of life, self-image, physical symptoms, and general health in studies assessing change in quality of life.  PHQ-9: Review Flowsheet  More data exists      06/22/2023 06/15/2023 04/28/2023 12/22/2022 11/08/2022  Depression screen PHQ 2/9  Decreased Interest 0 0 0 0 0  Down, Depressed, Hopeless 1 0 0 0 0  PHQ - 2 Score 1 0 0 0 0  Altered sleeping 1 - - - -  Tired, decreased energy 3 - - - -  Change in appetite 0 - - - -  Feeling bad or failure about yourself  0 - - - -  Trouble concentrating 1 - - - -  Moving slowly or fidgety/restless 0 - - - -  Suicidal thoughts 0 - - - -  PHQ-9 Score 6 - - - -  Difficult doing work/chores Not difficult at all - - - -    Details           Interpretation of Total Score  Total Score Depression Severity:  1-4 = Minimal  depression, 5-9 = Mild depression, 10-14 = Moderate depression, 15-19 = Moderately severe depression, 20-27 = Severe depression   Psychosocial Evaluation and Intervention:  Psychosocial Evaluation - 06/22/23 1122       Psychosocial Evaluation & Interventions   Interventions Stress management education;Encouraged to exercise with the program and follow exercise prescription    Comments Kristalyn identifies that she is dealing with alot of stress currently. She has had to place her husband in  a nursing home. She is also worried about her declining health. She has worked with a Paramedic in the past, but therapist told her she didn't need therapy. She declines medications and therapy at this time.    Expected Outcomes For Lulie to participate in PR free of any psychosocial barriers or concers.    Continue Psychosocial Services  Follow up required by staff             Psychosocial Re-Evaluation:  Psychosocial Re-Evaluation     Row Name 07/06/23 1549 08/01/23 1355           Psychosocial Re-Evaluation   Current issues with Current Stress Concerns;Current Anxiety/Panic Current Stress Concerns      Comments Myria denies any new psychosocial barriers or concerns at this time. She is handling her stressors well at this time. Staff will educate Raya on healthy ways to deal with stress. She still denies a referral for a therapist or medications at this time. Rieta denies any new psychosocial barriers or concerns at this time. She still has some stress from time to time with her own declining health as well as her husband's declining health. She states exercising has helped alot with her stressors. She still denies a referral for a therapist or medications at this time.      Expected Outcomes For Virgene to participate in PR free of any psychosocial barriers or concerns. For Ammara to participate in PR free of any psychosocial barriers or concerns.      Interventions Stress management  education;Encouraged to attend Pulmonary Rehabilitation for the exercise Encouraged to attend Pulmonary Rehabilitation for the exercise      Continue Psychosocial Services  Follow up required by staff Follow up required by staff               Psychosocial Discharge (Final Psychosocial Re-Evaluation):  Psychosocial Re-Evaluation - 08/01/23 1355       Psychosocial Re-Evaluation   Current issues with Current Stress Concerns    Comments Tenzin denies any new psychosocial barriers or concerns at this time. She still has some stress from time to time with her own declining health as well as her husband's declining health. She states exercising has helped alot with her stressors. She still denies a referral for a therapist or medications at this time.    Expected Outcomes For Tona to participate in PR free of any psychosocial barriers or concerns.    Interventions Encouraged to attend Pulmonary Rehabilitation for the exercise    Continue Psychosocial Services  Follow up required by staff             Education: Education Goals: Education classes will be provided on a weekly basis, covering required topics. Participant will state understanding/return demonstration of topics presented.  Learning Barriers/Preferences:  Learning Barriers/Preferences - 06/22/23 1123       Learning Barriers/Preferences   Learning Barriers Sight;Hearing    Learning Preferences Group Instruction             Education Topics: Introduction to Pulmonary Rehab Group instruction provided by PowerPoint, verbal discussion, and written material to support subject matter. Instructor reviews what Pulmonary Rehab is, the purpose of the program, and how patients are referred.     Know Your Numbers Group instruction that is supported by a PowerPoint presentation. Instructor discusses importance of knowing and understanding resting, exercise, and post-exercise oxygen saturation, heart rate, and blood pressure.  Oxygen saturation, heart rate, blood pressure, rating of perceived exertion, and dyspnea are reviewed  along with a normal range for these values.    Exercise for the Pulmonary Patient Group instruction that is supported by a PowerPoint presentation. Instructor discusses benefits of exercise, core components of exercise, frequency, duration, and intensity of an exercise routine, importance of utilizing pulse oximetry during exercise, safety while exercising, and options of places to exercise outside of rehab.       MET Level  Group instruction provided by PowerPoint, verbal discussion, and written material to support subject matter. Instructor reviews what METs are and how to increase METs.  Flowsheet Row PULMONARY REHAB OTHER RESPIRATORY from 08/04/2023 in [facility]  Date 08/04/23  Educator EP  Instruction Review Code 1- Verbalizes Understanding       Pulmonary Medications Verbally interactive group education provided by instructor with focus on inhaled medications and proper administration.   Anatomy and Physiology of the Respiratory System Group instruction provided by PowerPoint, verbal discussion, and written material to support subject matter. Instructor reviews respiratory cycle and anatomical components of the respiratory system and their functions. Instructor also reviews differences in obstructive and restrictive respiratory diseases with examples of each.    Oxygen Safety Group instruction provided by PowerPoint, verbal discussion, and written material to support subject matter. There is an overview of "What is Oxygen" and "Why do we need it".  Instructor also reviews how to create a safe environment for oxygen use, the importance of using oxygen as prescribed, and the risks of noncompliance. There is a brief discussion on traveling with oxygen and resources the patient may utilize.   Oxygen Use Group instruction  provided by PowerPoint, verbal discussion, and written material to discuss how supplemental oxygen is prescribed and different types of oxygen supply systems. Resources for more information are provided.  Flowsheet Row PULMONARY REHAB OTHER RESPIRATORY from 06/30/2023 in [facility]  Date 06/30/23  Educator RT  Instruction Review Code 1- Verbalizes Understanding       Breathing Techniques Group instruction that is supported by demonstration and informational handouts. Instructor discusses the benefits of pursed lip and diaphragmatic breathing and detailed demonstration on how to perform both.  Flowsheet Row PULMONARY REHAB OTHER RESPIRATORY from 07/07/2023 in [facility]  Date 07/07/23  Educator RN  Instruction Review Code 1- Verbalizes Understanding        Risk Factor Reduction Group instruction that is supported by a PowerPoint presentation. Instructor discusses the definition of a risk factor, different risk factors for pulmonary disease, and how the heart and lungs work together. Flowsheet Row PULMONARY REHAB OTHER RESPIRATORY from 07/28/2023 in [facility]  Date 07/28/23  Educator Ep  Instruction Review Code 1- Verbalizes Understanding       MD Day A group question and answer session with a medical doctor that allows participants to ask questions that relate to their pulmonary disease state.   Nutrition for the Pulmonary Patient Group instruction provided by PowerPoint slides, verbal discussion, and written materials to support subject matter. The instructor gives an explanation and review of healthy diet recommendations, which includes a discussion on weight management, recommendations for fruit and vegetable consumption, as well as protein, fluid, caffeine, fiber, sodium, sugar, and alcohol. Tips for  eating when patients are short of breath are discussed.    Other Education Group or individual verbal, written, or video  instructions that support the educational goals of the pulmonary rehab program. Flowsheet Row PULMONARY REHAB OTHER RESPIRATORY from 07/21/2023 in [facility]  Date 07/21/23  Educator RN  Instruction Review Code 1- Verbalizes Understanding        Knowledge Questionnaire Score:  Knowledge Questionnaire Score - 06/22/23 1153       Knowledge Questionnaire Score   Pre Score 17/18             Core Components/Risk Factors/Patient Goals at Admission:  Personal Goals and Risk Factors at Admission - 06/22/23 1123       Core Components/Risk Factors/Patient Goals on Admission    Weight Management Yes;Weight Loss    Intervention Weight Management: Develop a combined nutrition and exercise program designed to reach desired caloric intake, while maintaining appropriate intake of nutrient and fiber, sodium and fats, and appropriate energy expenditure required for the weight goal.;Weight Management: Provide education and appropriate resources to help participant work on and attain dietary goals.;Weight Management/Obesity: Establish reasonable short term and long term weight goals.;Obesity: Provide education and appropriate resources to help participant work on and attain dietary goals.    Expected Outcomes Weight Loss: Understanding of general recommendations for a balanced deficit meal plan, which promotes 1-2 lb weight loss per week and includes a negative energy balance of [phone] kcal/d;Understanding recommendations for meals to include 15-35% energy as protein, 25-35% energy from fat, 35-60% energy from carbohydrates, less than 200mg  of dietary cholesterol, 20-35 gm of total fiber daily;Understanding of distribution of calorie intake throughout the day with the consumption of 4-5 meals/snacks    Improve shortness of  breath with ADL's Yes    Intervention Provide education, individualized exercise plan and daily activity instruction to help decrease symptoms of SOB with activities of daily living.    Expected Outcomes Short Term: Improve cardiorespiratory fitness to achieve a reduction of symptoms when performing ADLs;Long Term: Be able to perform more ADLs without symptoms or delay the onset of symptoms    Increase knowledge of respiratory medications and ability to use respiratory devices properly  Yes    Intervention Provide education and demonstration as needed of appropriate use of medications, inhalers, and oxygen therapy.    Expected Outcomes Short Term: Achieves understanding of medications use. Understands that oxygen is a medication prescribed by physician. Demonstrates appropriate use of inhaler and oxygen therapy.;Long Term: Maintain appropriate use of medications, inhalers, and oxygen therapy.    Hypertension Yes    Intervention Provide education on lifestyle modifcations including regular physical activity/exercise, weight management, moderate sodium restriction and increased consumption of fresh fruit, vegetables, and low fat dairy, alcohol moderation, and smoking cessation.;Monitor prescription use compliance.    Expected Outcomes Short Term: Continued assessment and intervention until BP is < 140/89mm HG in hypertensive participants. < 130/72mm HG in hypertensive participants with diabetes, heart failure or chronic kidney disease.;Long Term: Maintenance of blood pressure at goal levels.    Stress Yes    Intervention Offer individual and/or small group education and counseling on adjustment to heart disease, stress management and health-related lifestyle change. Teach and support self-help strategies.;Refer participants experiencing significant psychosocial distress to appropriate mental health specialists for further evaluation and treatment. When possible, include family members and significant others  in education/counseling sessions.    Expected Outcomes Short Term: Participant demonstrates changes in health-related behavior, relaxation and other stress management skills, ability to obtain effective social support, and compliance with psychotropic medications if prescribed.;Long Term: Emotional  wellbeing is indicated by absence of clinically significant psychosocial distress or social isolation.             Core Components/Risk Factors/Patient Goals Review:   Goals and Risk Factor Review     Row Name 07/06/23 1555 08/01/23 1359           Core Components/Risk Factors/Patient Goals Review   Personal Goals Review Weight Management/Obesity;Improve shortness of breath with ADL's;Develop more efficient breathing techniques such as purse lipped breathing and diaphragmatic breathing and practicing self-pacing with activity.;Increase knowledge of respiratory medications and ability to use respiratory devices properly.;Hypertension;Stress Weight Management/Obesity;Improve shortness of breath with ADL's;Hypertension      Review Sharlene Dory has only attended 3 classes so far. Goal for weight loss is progressing. Shiane is working with staff dietician to achieve her weight loss goals. Goal progressing on improving her shortness of breath with ADLs. Goal progressing on developing more efficient breathing techniques such as purse lipped breathing and diaphragmatic breathing; and practicing self-pacing with activity. Goal progressing on managing stress. Goal progressing on increase knowledge of respiratory medications and ability to use respiratory devices properly. Goal progressing on hypertension. Edlin states her B/P is up and down.  Staff has given Livier a blood pressure log to record her B/P's at home. She will follow up with her PCP. We will continue to monitor Rudi's progress. Goal for weight loss is progressing. Azenet is working with staff dietician to achieve her weight loss goals. Goal progressing on  improving her shortness of breath with ADLs. Goal met on developing more efficient breathing techniques such as purse lipped breathing and diaphragmatic breathing; and practicing self-pacing with activity. Florette demonstrates purse lip breathing when she gets short of breath.  Goal met on managing stress. Goal met on increase knowledge of respiratory medications and ability to use respiratory devices properly. Ala is able to demonstrate proper MDI use to the respiratory therapist. Goal progressing on hypertension. Dorea states her B/P is up and down.  Staff has given Sherrye a blood pressure log to record her B/P's at home. She will follow up with her PCP. We will continue to monitor Tonantzin's progress.      Expected Outcomes See admission goals See admission goals               Core Components/Risk Factors/Patient Goals at Discharge (Final Review):   Goals and Risk Factor Review - 08/01/23 1359       Core Components/Risk Factors/Patient Goals Review   Personal Goals Review Weight Management/Obesity;Improve shortness of breath with ADL's;Hypertension    Review Goal for weight loss is progressing. Kinyatta is working with staff dietician to achieve her weight loss goals. Goal progressing on improving her shortness of breath with ADLs. Goal met on developing more efficient breathing techniques such as purse lipped breathing and diaphragmatic breathing; and practicing self-pacing with activity. Myna demonstrates purse lip breathing when she gets short of breath.  Goal met on managing stress. Goal met on increase knowledge of respiratory medications and ability to use respiratory devices properly. Bobby is able to demonstrate proper MDI use to the respiratory therapist. Goal progressing on hypertension. Ivannah states her B/P is up and down.  Staff has given Zaia a blood pressure log to record her B/P's at home. She will follow up with her PCP. We will continue to monitor Megan's progress.     Expected Outcomes See admission goals             ITP Comments:Pt is making expected  progress toward Pulmonary Rehab goals after completing 13 sessions. Recommend continued exercise, life style modification, education, and utilization of breathing techniques to increase stamina and strength, while also decreasing shortness of breath with exertion.  Dr. Mechele Collin is Medical Director for Pulmonary Rehab at [facility].     Comments: Dr. Mechele Collin is Medical Director for Pulmonary Rehab at [facility].

## 2023-08-11 ENCOUNTER — Encounter (HOSPITAL_COMMUNITY)
Admission: RE | Admit: 2023-08-11 | Discharge: 2023-08-11 | Disposition: A | Discharge status: Home / Self Care | Payer: Medicare Other | Source: Ambulatory Visit | Attending: Pulmonary Disease | Admitting: Pulmonary Disease

## 2023-08-11 DIAGNOSIS — J439 Emphysema, unspecified: Secondary | ICD-10-CM | POA: Diagnosis not present

## 2023-08-11 NOTE — Progress Notes (Signed)
Daily Session Note  Patient Details  Name: Megan [name] MRN: [id number] Date of Birth: 07/28/1946 Referring Provider:   Doristine Devoid Pulmonary Rehab Walk Test from 06/22/2023 in [facility]  Referring Provider Hunsucker       Encounter Date: 08/11/2023  Check In:  Session Check In - 08/11/23 1211       Check-In   Supervising physician immediately available to respond to emergencies [facility] MD immediately available    Physician(s) Metta Clines, NP    Location [facility]    Staff Present Raford Pitcher, MS, ACSM-CEP, Exercise Physiologist;Jadien Lehigh Erin Sons BS, ACSM-CEP, Exercise Physiologist;Samantha Belarus, RD, Dutch Gray, RN, BSN    Virtual Visit No    Medication changes reported     No    Fall or balance concerns reported    No    Tobacco Cessation No Change    Warm-up and Cool-down Performed as group-led Writer Performed Yes    VAD Patient? No    PAD/SET Patient? No      Pain Assessment   Currently in Pain? No/denies    Multiple Pain Sites No             Capillary Blood Glucose: No results found. However, due to the size of the patient record, not all encounters were searched. Please check Results Review for a complete set of results.    Social History   Tobacco Use  Smoking Status Former   Current packs/day: 0.00   Types: Cigarettes   Quit date: 12/06/1958   Years since quitting: 64.7   Passive exposure: Never  Smokeless Tobacco Never    Goals Met:  Proper associated with RPD/PD & O2 Sat Independence with exercise equipment Exercise tolerated well No report of concerns or symptoms today Strength training completed today  Goals Unmet:  Not Applicable  Comments: Service time is from 1006 to 1155.    Dr. Mechele Collin is Medical Director for Pulmonary Rehab at [facility].

## 2023-08-16 ENCOUNTER — Ambulatory Visit: Payer: Medicare Other | Admitting: Pulmonary Disease

## 2023-08-16 ENCOUNTER — Encounter: Payer: Self-pay | Admitting: Pulmonary Disease

## 2023-08-16 ENCOUNTER — Encounter (HOSPITAL_COMMUNITY)
Admission: RE | Admit: 2023-08-16 | Discharge: 2023-08-16 | Disposition: A | Discharge status: Home / Self Care | Payer: Medicare Other | Source: Ambulatory Visit | Attending: Pulmonary Disease | Admitting: Pulmonary Disease

## 2023-08-16 VITALS — BP 118/54 | HR 74 | Temp 98.2°F | Ht 60.0 in | Wt 131.6 lb

## 2023-08-16 DIAGNOSIS — R042 Hemoptysis: Secondary | ICD-10-CM | POA: Diagnosis not present

## 2023-08-16 DIAGNOSIS — G4734 Idiopathic sleep related nonobstructive alveolar hypoventilation: Secondary | ICD-10-CM

## 2023-08-16 DIAGNOSIS — J439 Emphysema, unspecified: Secondary | ICD-10-CM

## 2023-08-16 MED ORDER — AZITHROMYCIN 250 MG PO TABS: ORAL_TABLET | ORAL | 0 refills | Status: AC

## 2023-08-16 MED ORDER — PREDNISONE 20 MG PO TABS: ORAL_TABLET | ORAL | 0 refills | Status: AC

## 2023-08-16 NOTE — Patient Instructions (Signed)
Nice to see you again  Take prednisone 40 mg for 5 days and 20 mg for 5 days.  Take azithromycin 500 mg day 1 then 250 mg days 2 through 5  I ordered the CT scan because of the occasional coughing up of blood  Over the open oximetry to see if you still need oxygen at night  Return to clinic in 3 months or sooner as needed with Dr. [name]

## 2023-08-16 NOTE — Progress Notes (Signed)
Daily Session Note  Patient Details  Name: Megan [name] MRN: [id number] Date of Birth: 04-Apr-1946 Referring Provider:   Doristine Devoid Pulmonary Rehab Walk Test from [facility]/22/2023 in [facility]  Referring Provider Hunsucker       Encounter Date: 08/16/2023  Check In:  Session Check In - 08/16/23 1036       Check-In   Supervising physician immediately available to respond to emergencies [facility] MD immediately available    Physician(s) Joni Reining, NP    Location [facility]    Staff Present Raford Pitcher, MS, ACSM-CEP, Exercise Physiologist;Tessy Pawelski Erin Sons BS, ACSM-CEP, Exercise Physiologist;Samantha Belarus, RD, Dutch Gray, RN, BSN    Virtual Visit No    Medication changes reported     No    Fall or balance concerns reported    No    Tobacco Cessation No Change    Warm-up and Cool-down Performed as group-led Writer Performed Yes    VAD Patient? No    PAD/SET Patient? No      Pain Assessment   Currently in Pain? No/denies    Multiple Pain Sites No             Capillary Blood Glucose: No results found. However, due to the size of the patient record, not all encounters were searched. Please check Results Review for a complete set of results.    Social History   Tobacco Use  Smoking Status Former   Current packs/day: 0.00   Types: Cigarettes   Quit date: 12/06/1958   Years since quitting: 64.7   Passive exposure: Never  Smokeless Tobacco Never    Goals Met:  Proper associated with RPD/PD & O2 Sat Independence with exercise equipment Exercise tolerated well No report of concerns or symptoms today Strength training completed today  Goals Unmet:  Not Applicable  Comments: Service time is from 1023 to 1145.    Dr. Mechele Collin is Medical Director for Pulmonary Rehab at [facility]

## 2023-08-16 NOTE — Progress Notes (Signed)
@Patient  ID: Megan [name], female    DOB: 30-Dec-1945, 77 y.o.   MRN: [id number]  Chief Complaint  Patient presents with   Follow-up    Cough began yesterday with chest rattling.  SaO2 in low 80's x 2 days.     Referring provider: Willow Ora, MD  HPI:   77 y.o. whom are seeing for acute visit for evaluation of cough productive green phlegm, wheezing, chills previously seen for evaluation of dyspnea on exertion related to emphysema and likely cardiac causes.  Most recent PCP note reviewed.  Most recent pulmonary clinic note from other provider reviewed.  Most recent cardiology note reviewed.  Rough summer.  Worsening cough congestion and runny.  Was seen by colleague during the summer.  Treated for exacerbation.  Started pulm rehab.  Was feeling okay cough manageable.  Got COVID-vaccine over the weekend about 72 hours ago.  Since then she noted her oxygen saturations a bit lower.  No real dyspnea or worsening cough etc.  Symptoms largely no worse.  But O2 sat is running lower.  HPI at initial visit: She was in her usual state of health.  She developed onset of shortness of breath a few weeks ago.  Different than baseline dyspnea.  Recurrent shorter distances.  Preceded with aches, sounds like viral infection.  Still some heart ache as well which is concerning to her given her early CAD and significant coronary history.  This prompted presentation to the ED.  There CTA PE protocol was negative for PE but showed presevere emphysematous changes with predilection for the upper lobes otherwise clear on my review and interpretation.  She subsequent underwent a left heart catheterization showed stable coronary artery disease and prior PCI and CABG.  This was reassuring.  In the interim she was placed on Breztri.  She flexes helped some with her shortness of breath.  Able to do more.  Still get a little bit winded on longer walks etc.  She used to walk a mile and a half a day.  She is working back  to getting to that distance.  PMH: Tobacco abuse in remission, hypertension, CAD, asthma, hyperlipidemia, seasonal allergies Surgical history: Back surgery, CABG, PCI Family history: Mother and father with CAD Social history: Former smoker, quit 1997, lives in [place] / Pulmonary Flowsheets:   ACT:      No data to display          MMRC:     No data to display          Epworth:      No data to display          Tests:   FENO:  No results found for: "NITRICOXIDE"  PFT:    Latest Ref Rng & Units 03/03/2023    1:20 PM  PFT Results  FVC-Pre L 1.85   FVC-Predicted Pre % 83   FVC-Post L 1.84   FVC-Predicted Post % 82   Pre FEV1/FVC % % 76   Post FEV1/FCV % % 76   FEV1-Pre L 1.41   FEV1-Predicted Pre % 85   FEV1-Post L 1.39   DLCO uncorrected ml/min/mmHg 9.07   DLCO UNC% % 55   DLCO corrected ml/min/mmHg 8.81   DLCO COR %Predicted % 53   DLVA Predicted % 67   TLC L 3.82   TLC % Predicted % 85   RV % Predicted % 92     WALK:     06/22/2023  10:29 AM 06/02/2023    4:34 PM 02/17/2023    4:46 PM  SIX MIN WALK  Supplimental Oxygen during Test? (L/min)  Yes Yes  O2 Flow Rate  4 L/min 2 L/min  Type  Pulse Continuous  2 Minute Oxygen Saturation % 88 %    2 Minute HR 80    4 Minute Oxygen Saturation % 90 %    4 Minute HR 79    6 Minute Oxygen Saturation % 91 %    6 Minute HR 80    Tech Comments:  Patient walked 2 laps on POC to see what pulsed level she needed to keep sats >88-90%.  She requires 4L pulsed to stay above 88-90%.  Tolerated walk well,. Patient SaO2 when brought back to exam room was 88% on room air.  Patient was immediately put on oxygen 2 lpm continuous.  After 5 minutes, SaO2 at 93%. Patient was able to walk 1 lap on 2 lpm continuous oxygen (tank) and SaO2 at 94%.  Patient c/o fatigue.  Patient was unable to walk any further laps.  Patient taken back to exam room. and SaO2 at  94% and heart rate 80 bpm.  Patient left with  an oxygen tank from [employer] (from closet).  Oxygen set on 2 lpm when patient taken to waiting room.  Patient did require assistance to get the oxygen tank/roller in her car.    Imaging: Personally reviewed and as per EMR discussion in this note No results found.  Lab Results: Personally reviewed CBC    Component Value Date/Time   WBC 15.0 (H) 02/17/2023 1026   RBC 4.66 02/17/2023 1026   HGB 14.4 02/17/2023 1026   HGB 15.2 06/29/2022 1538   HCT 41.7 02/17/2023 1026   HCT 45.3 06/29/2022 1538   PLT 259.0 02/17/2023 1026   PLT 272 06/29/2022 1538   MCV 89.3 02/17/2023 1026   MCV 89 06/29/2022 1538   MCH 29.5 07/11/2022 1912   MCHC 34.5 02/17/2023 1026   RDW 12.8 02/17/2023 1026   RDW 12.4 06/29/2022 1538   LYMPHSABS 1.2 02/17/2023 1026   LYMPHSABS 2.0 01/06/2022 0954   MONOABS 0.9 02/17/2023 1026   EOSABS 0.1 02/17/2023 1026   EOSABS 0.2 01/06/2022 0954   BASOSABS 0.0 02/17/2023 1026   BASOSABS 0.0 01/06/2022 0954    BMET    Component Value Date/Time   NA 138 02/17/2023 1026   NA 143 06/29/2022 1538   K 4.1 02/17/2023 1026   CL 103 02/17/2023 1026   CO2 25 02/17/2023 1026   GLUCOSE 119 (H) 02/17/2023 1026   BUN 19 02/17/2023 1026   BUN 7 (L) 06/29/2022 1538   CREATININE 1.16 02/17/2023 1026   CREATININE 0.71 06/29/2016 1057   CALCIUM 9.9 02/17/2023 1026   GFRNONAA >60 07/11/2022 1912   GFRAA 89 01/01/2020 0835    BNP    Component Value Date/Time   BNP 94.2 06/29/2022 1538   BNP 862.1 (H) 01/08/2021 1630    ProBNP    Component Value Date/Time   PROBNP 145.0 (H) 02/17/2023 1026    Specialty Problems       Pulmonary Problems   COPD with chronic bronchitis   Centrilobular emphysema (HCC)   Chronic respiratory failure (HCC)   Pneumonia   Oxygen dependent    Allergies  Allergen Reactions   Brilinta [Ticagrelor] Shortness Of Breath   Bextra [Valdecoxib] Other (See Comments)    Increased lft's    Hydrocod Poli-Chlorphe Poli Er Rash   Lipitor  [  Atorvastatin] Other (See Comments)    Leg weakness    Mevacor [Lovastatin] Other (See Comments)    Muscle weakness    Plavix [Clopidogrel Bisulfate] Itching and Other (See Comments)    Hands and feet tingle and itch   Zocor [Simvastatin] Other (See Comments)    Bones hurt    Doxycycline Diarrhea and Nausea And Vomiting   Metoprolol Other (See Comments)    Hair loss   Morphine And Codeine Itching and Nausea Only   Codeine Itching and Rash    Hyper-active   Lisinopril Cough    Immunization History  Administered Date(s) Administered   Covid-19, Mrna,Vaccine(Spikevax)101yrs and older 08/13/2023   Fluad Quad(high Dose 65+) 07/30/2019, 08/10/2022   Influenza, High Dose Seasonal PF 08/13/2017, 08/26/2018, 09/15/2020   Influenza, Seasonal, Injecte, Preservative Fre 09/21/2014, 09/06/2015, 07/21/2016   Influenza,inj,quad, With Preservative 08/06/2017   Influenza-Unspecified 08/10/2017, 10/13/2021   PFIZER(Purple Top)SARS-COV-2 Vaccination 12/22/2019, 01/12/2020, 09/15/2020   PPD Test 09/13/1979, 03/10/2021   Pfizer Covid-19 Vaccine Bivalent Booster 27yrs & up 10/13/2021   Pneumococcal Conjugate-13 12/31/2016   Pneumococcal Polysaccharide-23 12/16/2014, 08/01/2019   Respiratory Syncytial Virus Vaccine,Recomb Aduvanted(Arexvy) 10/06/2022   Td 05/13/2008   Td (Adult), 2 Lf Tetanus Toxid, Preservative Free 05/13/2008   Tdap 08/09/2012   Zoster Recombinant(Shingrix) 01/13/2018, 03/14/2018   Zoster, Live 11/03/2010    Past Medical History:  Diagnosis Date   AAA (abdominal aortic aneurysm) (HCC)    a. s/p stent grafting in 2009.   Adenomatous colon polyp 05/2001   Adenomatous duodenal polyp    Allergy    Anginal pain (HCC)    COPD (chronic obstructive pulmonary disease) (HCC)    pt denies COPD   Coronary artery disease    a. s/p CABG in 1995;  b. 05/2013 Neg MV, EF 82%;  c. 06/2013 Cath: LM nl, LAD 40-50p, LCX nl, RCA 80p/149m, VG->RCA->PDA->PLSA min irregs, LIMA->LAD atretic,  vigorous LV fxn.   Diabetes mellitus without complication (HCC)    history of, resolved. 2017   Diverticulosis    Eczema, dyshidrotic    Eczema, dyshidrotic 1986   Dr. Gerilyn Nestle   Emphysema of lung [facility])    Fatty liver    Fibromyalgia    GERD (gastroesophageal reflux disease)    Hyperlipidemia    Hypertension    Migraine    Myocardial infarction [facility])    Osteoarthritis    Osteoporosis    unsure, having a bone scan soon   Peripheral arterial disease (HCC)    left leg diagnosed by Dr Patty Sermons   Renal artery stenosis [facility])    a. 2008 s/p PTA    Tobacco History: Social History   Tobacco Use  Smoking Status Former   Current packs/day: 0.00   Types: Cigarettes   Quit date: 09/05/1996   Years since quitting: 26.9   Passive exposure: Never  Smokeless Tobacco Never   Counseling given: Not Answered   Continue to not smoke  Outpatient Encounter Medications as of 08/16/2023  Medication Sig   Accu-Chek FastClix Lancets MISC Check blood sugar once daily PRN   albuterol (PROAIR HFA) 108 (90 Base) MCG/ACT inhaler Inhale 2 puffs into the lungs every 6 (six) hours as needed for wheezing or shortness of breath.   albuterol (PROVENTIL) (2.5 MG/3ML) 0.083% nebulizer solution Take 3 mLs (2.5 mg total) by nebulization every 6 (six) hours as needed for wheezing or shortness of breath.   ALPRAZolam (XANAX) 0.25 MG tablet Take 1 tablet (0.25 mg total) by mouth at bedtime as needed  for sleep.   amLODipine (NORVASC) 5 MG tablet Take 1 tablet (5 mg total) by mouth daily.   aspirin EC 81 MG tablet Take 1 tablet (81 mg total) by mouth daily.   atorvastatin (LIPITOR) 80 MG tablet TAKE 1 TABLET BY MOUTH EVERY DAY   azithromycin (ZITHROMAX) 250 MG tablet Take 2 tablets (500 mg total) by mouth daily for 1 day, THEN 1 tablet (250 mg total) daily for 4 days.   B Complex-Biotin-FA (B-100 COMPLEX PO) Take 1 tablet by mouth every evening.   bismuth subsalicylate (PEPTO BISMOL) 262 MG/15ML suspension  Take 30 mLs by mouth every 6 (six) hours as needed for indigestion or diarrhea or loose stools.   Budeson-Glycopyrrol-Formoterol (BREZTRI AEROSPHERE) 160-9-4.8 MCG/ACT AERO Inhale 2 puffs into the lungs in the morning and at bedtime.   cetirizine (ZYRTEC) 10 MG tablet Take 10 mg by mouth daily as needed for allergies.   cholecalciferol (VITAMIN D3) 25 MCG (1000 UNIT) tablet Take 1,000 Units by mouth every evening.   CORDRAN 4 MCG/SQCM TAPE Apply 1 each topically daily as needed (Rash).    cycloSPORINE (RESTASIS) 0.05 % ophthalmic emulsion Place 1 drop into both eyes 2 (two) times daily.    Ergocalciferol 50 MCG (2000 UT) CAPS Take by mouth.   ezetimibe (ZETIA) 10 MG tablet TAKE 1 TABLET BY MOUTH EVERY DAY   gabapentin (NEURONTIN) 300 MG capsule Take 1 capsule (300 mg total) by mouth 2 (two) times daily.   glucose blood (ACCU-CHEK SMARTVIEW) test strip CHECK BLOOD SUGAR ONCE DAILY AS NEEDED   hydrochlorothiazide (HYDRODIURIL) 12.5 MG tablet TAKE 1 TABLET BY MOUTH EVERY DAY   isosorbide mononitrate (IMDUR) 30 MG 24 hr tablet TAKE 3 TABLETS ( 90 MG ) EVERY MORNING AND 1 TABLET (30 MG ) EVERY EVENING   losartan (COZAAR) 100 MG tablet TAKE 1 TABLET BY MOUTH EVERY DAY   metoprolol succinate (TOPROL-XL) 25 MG 24 hr tablet TAKE 1 TABLET (25 MG TOTAL) BY MOUTH DAILY.   Multiple Vitamins-Minerals (MULTIVITAMIN WITH MINERALS) tablet Take 1 tablet by mouth every evening.   nitroGLYCERIN (NITROSTAT) 0.4 MG SL tablet Place 1 tablet (0.4 mg total) under the tongue every 5 (five) minutes as needed for chest pain.   omeprazole (PRILOSEC) 40 MG capsule Take 1 capsule (40 mg total) by mouth in the morning and at bedtime.   ondansetron (ZOFRAN) 8 MG tablet Take 1 tablet (8 mg total) by mouth every 6 (six) hours as needed for nausea or vomiting.   oxyCODONE-acetaminophen (PERCOCET) 10-325 MG tablet Take 1 tablet by mouth in the morning and at bedtime. Can take one tablet three times a day as needed for back pain    polyethylene glycol (MIRALAX / GLYCOLAX) 17 g packet Take 17 g by mouth daily as needed for moderate constipation.   prasugrel (EFFIENT) 5 MG TABS tablet TAKE 1 TABLET (5 MG TOTAL) BY MOUTH DAILY.   predniSONE (DELTASONE) 20 MG tablet Take 2 tablets (40 mg total) by mouth daily with breakfast for 5 days, THEN 1 tablet (20 mg total) daily with breakfast for 5 days.   PRESCRIPTION MEDICATION Place 2 puffs into both ears daily as needed (ear infections). [facility] ear powder   Simethicone (GAS-X PO) Take 1-2 tablets by mouth daily as needed (gas).   torsemide (DEMADEX) 20 MG tablet Take 1/2 tablet daily as needed for swelling   tretinoin (RETIN-A) 0.1 % cream Apply 1 application. topically every evening.   [DISCONTINUED] atorvastatin (LIPITOR) 80 MG tablet TAKE 1  TABLET BY MOUTH EVERY DAY   [DISCONTINUED] torsemide (DEMADEX) 20 MG tablet Take by mouth.   No facility-administered encounter medications on file as of 08/16/2023.     Review of Systems  Review of Systems  N/a Physical Exam  BP (!) 118/54 (BP Location: Left Arm, Patient Position: Sitting, Cuff Size: Normal)   Pulse 74   Temp 98.2 F (36.8 C) (Oral)   Ht 5' (1.524 m)   Wt 131 lb 9.6 oz (59.7 kg)   SpO2 95% Comment: on 2L POC pulsed oxygen  BMI 25.70 kg/m   Wt Readings from Last 5 Encounters:  08/16/23 131 lb 9.6 oz (59.7 kg)  08/09/23 132 lb 4.4 oz (60 kg)  07/26/23 133 lb 2.5 oz (60.4 kg)  07/20/23 133 lb 4.8 oz (60.5 kg)  07/12/23 135 lb 9.3 oz (61.5 kg)    BMI Readings from Last 5 Encounters:  08/16/23 25.70 kg/m  08/09/23 25.83 kg/m  07/26/23 26.01 kg/m  07/20/23 26.03 kg/m  07/12/23 26.93 kg/m     Physical Exam General: Well-appearing, sitting in chair Eyes: EOMI, no icterus Neck: Supple, no JVP Pulmonary: Clear, no wheeze no crackles, normal work of breathing Cardiovascular warm, no edema Abdomen: Nondistended, bowel sounds present MSK: No synovitis, no joint effusion Neuro: Normal gait, no  weakness Psych: Normal mood, full affect   Assessment & Plan:   Dyspnea on exertion: Likely multifactorial related to cardiac issues, worsening diastology with exercise given significant left atrial dilation.  Also suspect large contribution from emphysema.  Severe emphysema on imaging.  Continue Breztri.  Continue cardiology follow-up.  PFTs normal with exception of decreased DLCO due to emphysema.  Chronic hypoxemic respiratory failure: Due to significant emphysematous changes on CT scan.  Stressed importance of keeping oxygen saturation 88% or above at rest and with exertion.  Some was worse in the last few days.  Possible worsening or early inflammation after COVID-vaccine.  Prednisone 40 mg for 5 days and 20 mg 5 days then stop.  Z-Pak sent in.  Possibly just worsening disease.  Nocturnal hypoxemia: Unlikely to improve.  She request another home over oximetry to evaluate.  This was ordered, to be performed on room air.  Hemoptysis: Scant.  Several discrete episodes in a month.  CT scan to evaluate for more insidious causes.  Most likely related to chronic cough, chronic bronchitis.  Chronic cough: Related to emphysema.  Chronic bronchitis.  Okay for intermittent courses of narcotic cough syrup given significant and severe coughing fits.  Return in about 3 months (around 11/15/2023).   Karren Burly, MD 08/16/2023   I spent 41 minutes in the care of the patient including review of records, face-to-face visit, coordination of care.

## 2023-08-18 ENCOUNTER — Ambulatory Visit
Admission: RE | Admit: 2023-08-18 | Discharge: 2023-08-18 | Disposition: A | Discharge status: Home / Self Care | Payer: Medicare Other | Source: Ambulatory Visit | Attending: Pulmonary Disease | Admitting: Pulmonary Disease

## 2023-08-18 ENCOUNTER — Encounter (HOSPITAL_COMMUNITY)
Admission: RE | Admit: 2023-08-18 | Discharge: 2023-08-18 | Disposition: A | Discharge status: Home / Self Care | Payer: Medicare Other | Source: Ambulatory Visit | Attending: Pulmonary Disease | Admitting: Pulmonary Disease

## 2023-08-18 DIAGNOSIS — R042 Hemoptysis: Secondary | ICD-10-CM

## 2023-08-18 DIAGNOSIS — J439 Emphysema, unspecified: Secondary | ICD-10-CM

## 2023-08-18 DIAGNOSIS — Z09 Encounter for follow-up examination after completed treatment for conditions other than malignant neoplasm: Secondary | ICD-10-CM | POA: Diagnosis not present

## 2023-08-18 DIAGNOSIS — R911 Solitary pulmonary nodule: Secondary | ICD-10-CM | POA: Diagnosis not present

## 2023-08-18 NOTE — Progress Notes (Signed)
Daily Session Note  Patient Details  Name: Megan [name] MRN: [id number] Date of Birth: 03-13-1946 Referring Provider:   Doristine Devoid Pulmonary Rehab Walk Test from 06/22/2023 in [facility]  Referring Provider [place]       Encounter Date: 08/18/2023  Check In:  Session Check In - 08/18/23 1138       Check-In   Supervising physician immediately available to respond to emergencies [facility] MD immediately available    Physician(s) Bernadene Person, NP    Location [facility]    Staff Present Raford Pitcher, MS, ACSM-CEP, Exercise Physiologist;Casey Erin Sons BS, ACSM-CEP, Exercise Physiologist;Samantha Belarus, RD, Dutch Gray, RN, BSN    Virtual Visit No    Medication changes reported     No    Fall or balance concerns reported    No    Tobacco Cessation No Change    Warm-up and Cool-down Performed as group-led Writer Performed Yes    VAD Patient? No    PAD/SET Patient? No      Pain Assessment   Currently in Pain? No/denies    Pain Score 0-No pain    Multiple Pain Sites No             Capillary Blood Glucose: No results found. However, due to the size of the patient record, not all encounters were searched. Please check Results Review for a complete set of results.    Social History   Tobacco Use  Smoking Status Former   Current packs/day: 0.00   Types: Cigarettes   Quit date: 09/05/1996   Years since quitting: 26.9   Passive exposure: Never  Smokeless Tobacco Never    Goals Met:  Proper associated with RPD/PD & O2 Sat Independence with exercise equipment Exercise tolerated well No report of concerns or symptoms today Strength training completed today  Goals Unmet:  Not Applicable  Comments: Service time is from 1023 to 1148.    Dr. Mechele Collin is Medical Director for Pulmonary Rehab at [facility].

## 2023-08-22 ENCOUNTER — Telehealth: Payer: Self-pay | Admitting: Pharmacist

## 2023-08-22 NOTE — Telephone Encounter (Signed)
Patient called regarding text she received from [facility] [employer] program. She wanted to make sure this was indeed from [facility] [employer] and not a scam.  Explained that [place] [facility] [employer] has been sending text messages and emails regarding re-enrollment in their program for 2025. Patient will complete information needed.  Will plan to follow up to make sure has been processed in October.

## 2023-08-23 ENCOUNTER — Encounter (HOSPITAL_COMMUNITY)
Admission: RE | Admit: 2023-08-23 | Discharge: 2023-08-23 | Disposition: A | Discharge status: Home / Self Care | Payer: Medicare Other | Source: Ambulatory Visit | Attending: Pulmonary Disease | Admitting: Pulmonary Disease

## 2023-08-23 ENCOUNTER — Telehealth: Payer: Self-pay | Admitting: Pulmonary Disease

## 2023-08-23 VITALS — Wt 131.4 lb

## 2023-08-23 DIAGNOSIS — J439 Emphysema, unspecified: Secondary | ICD-10-CM | POA: Diagnosis not present

## 2023-08-23 NOTE — Progress Notes (Signed)
Cardiology Office Note   Date:  08/29/2023   ID:  Megan, [name] [birth year], MRN [id number]  PCP:  Willow Ora, MD  Cardiologist: Yael Coppess [place] MD  Chief Complaint  Patient presents with   Shortness of Breath    History of Present Illness: Megan [name] is a 77 y.o. female who is seen for follow up CAD.  She has a history  of CAD, HTN, HL, and PAD.   She is status post coronary artery bypass graft surgery in 1995- Dr Tyrone Sage. She had a renal artery stent for renal artery stenosis in 2008. She had a stent graft of an abdominal aortic aneurysm 2009. She is s/p stenting of the left iliac. She is followed by [facility] for PAD. Last CT in May 2018. She had [place] in June 2019 showing no endoleak.    She was hospitalized in July 2014 and had cardiac catheterization which showed that her grafts were open. EF normal.  She was hospitalized 11/15/2014 with which he describes as an aching sensation in her chest.    She underwent a treadmill Myoview on 11/17/14 which showed no ischemia and her ejection fraction was 86%.   In June 2019 she had Abdominal US at [facility] showing AAA of 4 cm.   She was in the hospital  01/05/21 and discharged on 2/8- for AKI, diarrhea- diverticulitis (sigmoid colon), metabolic encephalopathy, UTI. Echo at that time was OK with normal EF and moderate TR.   She was re- admitted 2/26-02/03/21 with NSTEMI. Troponin up to 1085. Ecg showed significant inferolateral TWA. She underwent cardiac cath demonstrating patent LIMA to the LAD. The native RCA was occluded and she had severe sequential lesions in the SVG to RCA. This was treated with overlapping DES x 2- 3.0 x 26 and 3.5 x 34 mm Onyx stents.  Procedure was complicated by a left forearm hematoma.   On prior visit she complained of increased SOB- this was felt to be related to Brilinta. We switched to Plavix but she had an allergic reaction with rash and hives. Later switched to Prasugrel- reduced dose based on age and female.     She presented to the ED on 06/25/21 with complaints of chest pain over the past 2 days. She underwent repeat cardiac cath demonstrating restenosis of the SVG to RCA with clear evidence of prior stent fracture. She underwent repeat PCI with 3.5 x 26 mm Onyx stent post dilated to 4.0 with Sabula balloon using IVUS guidance. Of note there were no collaterals to the distal RCA. There was significant aortic calcification.   She was seen in the ED on 08/27/21 with recurrent chest pain. She underwent repeat cardiac cath which showed the stent was patent.   Was seen by Gillian Shields PA on Jully 25.  Noted symptoms of dyspnea. was seen by her PCP. Noted her BP was a little high and Losartan dose was increased to 100mg  daily. The next day started having cold chills and shaking. Felt she was coming down with a cold. Noted body aches, temperature was 36 F which is much higher than her baseline. Cough with sputum production. COVID tests x3 were negative. Later developed  sporadic shortness of breath associated with chest aching. She continued to have symptoms of atypical chest pain and some dyspnea so we proceeded with repeat cardiac cath in August 2023. This showed patent grafts and no new disease.  She has had progressive SOB. Seen by pulmonary and now on home oxygen.  D dimer elevated but CT showed no PE. +emphysema. No cough. Minor swelling. Pro BNP mildly elevated 145. CXR without edema. We performed Echo which was normal.   She has since been followed by Dr [name] with pulmonary. Has advance emphysema. On oxygen during day. Also reports she had Lyme disease that was treated. In pulmonary rehab. Denies chest pain. Concerned about amount of bruising she has with some burning.     Past Medical History:  Diagnosis Date   AAA (abdominal aortic aneurysm) (HCC)    a. s/p stent grafting in 2009.   Adenomatous colon polyp 05/2001   Adenomatous duodenal polyp    Allergy    Anginal pain (HCC)    COPD (chronic  obstructive pulmonary disease) (HCC)    pt denies COPD   Coronary artery disease    a. s/p CABG in 1995;  b. 05/2013 Neg MV, EF 82%;  c. 06/2013 Cath: LM nl, LAD 40-50p, LCX nl, RCA 80p/157m, VG->RCA->PDA->PLSA min irregs, LIMA->LAD atretic, vigorous LV fxn.   Diabetes mellitus without complication (HCC)    history of, resolved. 2017   Diverticulosis    Eczema, dyshidrotic    Eczema, dyshidrotic 1986   Dr. Gerilyn Nestle   Emphysema of lung [facility])    Fatty liver    Fibromyalgia    GERD (gastroesophageal reflux disease)    Hyperlipidemia    Hypertension    Migraine    Myocardial infarction [facility])    Osteoarthritis    Osteoporosis    unsure, having a bone scan soon   Peripheral arterial disease (HCC)    left leg diagnosed by Dr Patty Sermons   Renal artery stenosis [facility])    a. 2008 s/p PTA    Past Surgical History:  Procedure Laterality Date   ABDOMINAL AORTAGRAM N/A 03/06/2014   Procedure: ABDOMINAL AORTAGRAM;  Surgeon: Larina Earthly, MD;  Location: [facility] CATH LAB;  Service: Cardiovascular;  Laterality: N/A;   ABDOMINAL AORTIC ANEURYSM REPAIR  2009   stenting   ABDOMINAL HYSTERECTOMY     Total, 1992   BREAST BIOPSY     CARDIAC CATHETERIZATION     CARDIOVASCULAR STRESS TEST  11/11/2009   normal study   CARPAL TUNNEL RELEASE  08/1993   left wrist   CARPAL TUNNEL RELEASE  01/1997   right   CATARACT EXTRACTION, BILATERAL  2021   CORONARY ARTERY BYPASS GRAFT  07/1994   Triple by Dr Ofilia Neas   CORONARY STENT INTERVENTION N/A 02/02/2021   Procedure: CORONARY STENT INTERVENTION;  Surgeon: Yvonne Kendall, MD;  Location: [facility] INVASIVE CV LAB;  Service: Cardiovascular;  Laterality: N/A;   CORONARY STENT INTERVENTION N/A 06/26/2021   Procedure: CORONARY STENT INTERVENTION;  Surgeon: Tonny Bollman, MD;  Location: [facility] INVASIVE CV LAB;  Service: Cardiovascular;  Laterality: N/A;   CORONARY ULTRASOUND/IVUS N/A 06/26/2021   Procedure: Intravascular Ultrasound/IVUS;  Surgeon: Tonny Bollman, MD;   Location: [facility] INVASIVE CV LAB;  Service: Cardiovascular;  Laterality: N/A;   CORONARY ULTRASOUND/IVUS N/A 09/03/2021   Procedure: Intravascular Ultrasound/IVUS;  Surgeon: Orbie Pyo, MD;  Location: [facility] INVASIVE CV LAB;  Service: Cardiovascular;  Laterality: N/A;   dental implants     EYE SURGERY  03/2020   cataract   HAND SURGERY Right 09/2002   Right hand pulley release   LAMINECTOMY WITH POSTERIOR LATERAL ARTHRODESIS LEVEL 1 N/A 03/24/2022   Procedure: LAMINECTOMY, FORAMINOTOMY, LUMBAR TWO- THREE ; INTERTRANSVERSE FUSION LUMBAR TWO - LUMBAR THREE RIGHT;  Surgeon: Tia Alert, MD;  Location: [facility] OR;  Service: Neurosurgery;  Laterality: N/A;   LEFT HEART CATH AND CORONARY ANGIOGRAPHY N/A 06/26/2021   Procedure: LEFT HEART CATH AND CORONARY ANGIOGRAPHY;  Surgeon: Tonny Bollman, MD;  Location: [facility] INVASIVE CV LAB;  Service: Cardiovascular;  Laterality: N/A;   LEFT HEART CATH AND CORS/GRAFTS ANGIOGRAPHY N/A 02/02/2021   Procedure: LEFT HEART CATH AND CORS/GRAFTS ANGIOGRAPHY;  Surgeon: Yvonne Kendall, MD;  Location: [facility] INVASIVE CV LAB;  Service: Cardiovascular;  Laterality: N/A;   LEFT HEART CATH AND CORS/GRAFTS ANGIOGRAPHY N/A 09/03/2021   Procedure: LEFT HEART CATH AND CORS/GRAFTS ANGIOGRAPHY;  Surgeon: Orbie Pyo, MD;  Location: [facility] INVASIVE CV LAB;  Service: Cardiovascular;  Laterality: N/A;   LEFT HEART CATH AND CORS/GRAFTS ANGIOGRAPHY N/A 07/15/2022   Procedure: LEFT HEART CATH AND CORS/GRAFTS ANGIOGRAPHY;  Surgeon: [name], Malikye Reppond M, MD;  Location: [facility] INVASIVE CV LAB;  Service: Cardiovascular;  Laterality: N/A;   LEFT HEART CATHETERIZATION WITH CORONARY/GRAFT ANGIOGRAM N/A 06/11/2013   Procedure: LEFT HEART CATHETERIZATION WITH Isabel Caprice;  Surgeon: Vesta Mixer, MD;  Location: [facility] CATH LAB;  Service: Cardiovascular;  Laterality: N/A;   MULTIPLE TOOTH EXTRACTIONS  2000   All upper teeth.  Has full dneture.    RENAL ARTERY STENT  2008   SHOULDER ARTHROSCOPY WITH ROTATOR  CUFF REPAIR Right 09/27/2018   Procedure: Right shoulder mini open rotator cuff repair;  Surgeon: Jene Every, MD;  Location: [facility] ORS;  Service: Orthopedics;  Laterality: Right;  90 mins   thyroid cyst apiration  05/1997 and 07/1998   TONSILECTOMY, ADENOIDECTOMY, BILATERAL MYRINGOTOMY AND TUBES  1989   TONSILLECTOMY     TOTAL ABDOMINAL HYSTERECTOMY  1992     Current Outpatient Medications  Medication Sig Dispense Refill   Accu-Chek FastClix Lancets MISC Check blood sugar once daily PRN 100 each 1   albuterol (PROAIR HFA) 108 (90 Base) MCG/ACT inhaler Inhale 2 puffs into the lungs every 6 (six) hours as needed for wheezing or shortness of breath. 1 each 11   albuterol (PROVENTIL) (2.5 MG/3ML) 0.083% nebulizer solution Take 3 mLs (2.5 mg total) by nebulization every 6 (six) hours as needed for wheezing or shortness of breath. 360 mL 5   ALPRAZolam (XANAX) 0.25 MG tablet Take 1 tablet (0.25 mg total) by mouth at bedtime as needed for sleep. 90 tablet 3   amLODipine (NORVASC) 5 MG tablet Take 1 tablet (5 mg total) by mouth daily. 90 tablet 3   aspirin EC 81 MG tablet Take 1 tablet (81 mg total) by mouth daily. 90 tablet 3   atorvastatin (LIPITOR) 80 MG tablet TAKE 1 TABLET BY MOUTH EVERY DAY 90 tablet 3   B Complex-Biotin-FA (B-100 COMPLEX PO) Take 1 tablet by mouth every evening.     bismuth subsalicylate (PEPTO BISMOL) 262 MG/15ML suspension Take 30 mLs by mouth every 6 (six) hours as needed for indigestion or diarrhea or loose stools.     Budeson-Glycopyrrol-Formoterol (BREZTRI AEROSPHERE) 160-9-4.8 MCG/ACT AERO Inhale 2 puffs into the lungs in the morning and at bedtime. 1 each 11   cetirizine (ZYRTEC) 10 MG tablet Take 10 mg by mouth daily as needed for allergies.     cholecalciferol (VITAMIN D3) 25 MCG (1000 UNIT) tablet Take 1,000 Units by mouth every evening.     CORDRAN 4 MCG/SQCM TAPE Apply 1 each topically daily as needed (Rash).      cycloSPORINE (RESTASIS) 0.05 % ophthalmic emulsion  Place 1 drop into both eyes 2 (two) times daily.      Ergocalciferol 50 MCG (  2000 UT) CAPS Take by mouth.     ezetimibe (ZETIA) 10 MG tablet TAKE 1 TABLET BY MOUTH EVERY DAY 90 tablet 3   gabapentin (NEURONTIN) 300 MG capsule Take 1 capsule (300 mg total) by mouth 2 (two) times daily. 270 capsule 3   glucose blood (ACCU-CHEK SMARTVIEW) test strip CHECK BLOOD SUGAR ONCE DAILY AS NEEDED 100 strip 1   hydrochlorothiazide (HYDRODIURIL) 12.5 MG tablet TAKE 1 TABLET BY MOUTH EVERY DAY 90 tablet 3   isosorbide mononitrate (IMDUR) 30 MG 24 hr tablet TAKE 3 TABLETS ( 90 MG ) EVERY MORNING AND 1 TABLET (30 MG ) EVERY EVENING 360 tablet 1   losartan (COZAAR) 100 MG tablet TAKE 1 TABLET BY MOUTH EVERY DAY 90 tablet 3   metoprolol succinate (TOPROL-XL) 25 MG 24 hr tablet TAKE 1 TABLET (25 MG TOTAL) BY MOUTH DAILY. 90 tablet 2   Multiple Vitamins-Minerals (MULTIVITAMIN WITH MINERALS) tablet Take 1 tablet by mouth every evening.     nitroGLYCERIN (NITROSTAT) 0.4 MG SL tablet Place 1 tablet (0.4 mg total) under the tongue every 5 (five) minutes as needed for chest pain. 25 tablet 11   omeprazole (PRILOSEC) 40 MG capsule Take 1 capsule (40 mg total) by mouth in the morning and at bedtime. 180 capsule 3   ondansetron (ZOFRAN) 8 MG tablet Take 1 tablet (8 mg total) by mouth every 6 (six) hours as needed for nausea or vomiting. 20 tablet 0   oxyCODONE-acetaminophen (PERCOCET) 10-325 MG tablet Take 1 tablet by mouth in the morning and at bedtime. Can take one tablet three times a day as needed for back pain     polyethylene glycol (MIRALAX / GLYCOLAX) 17 g packet Take 17 g by mouth daily as needed for moderate constipation.     prasugrel (EFFIENT) 5 MG TABS tablet TAKE 1 TABLET (5 MG TOTAL) BY MOUTH DAILY. 90 tablet 3   PRESCRIPTION MEDICATION Place 2 puffs into both ears daily as needed (ear infections). [facility] ear powder     Simethicone (GAS-X PO) Take 1-2 tablets by mouth daily as needed (gas).     tretinoin  (RETIN-A) 0.1 % cream Apply 1 application. topically every evening.     torsemide (DEMADEX) 20 MG tablet Take 1 tablet (20 mg total) by mouth daily. Take 1/2 tablet daily as needed for swelling 90 tablet 3   No current facility-administered medications for this visit.    Allergies:   Brilinta [ticagrelor], Bextra [valdecoxib], Hydrocod poli-chlorphe poli er, Lipitor [atorvastatin], Mevacor [lovastatin], Plavix [clopidogrel bisulfate], Zocor [simvastatin], Doxycycline, Metoprolol, Morphine and codeine, Codeine, and Lisinopril    Social History:  The patient  reports that she quit smoking about 26 years ago. Her smoking use included cigarettes. She has never been exposed to tobacco smoke. She has never used smokeless tobacco. She reports current alcohol use of about 6.0 - 10.0 standard drinks of alcohol per week. She reports that she does not use drugs.   Family History:  The patient's family history includes AAA (abdominal aortic aneurysm) in her brother; Breast cancer in her sister; Colon cancer in her cousin and paternal grandmother; Diabetes in her brother and sister; Heart attack in her brother, father, mother, and sister; Heart disease in her brother, brother, father, mother, and sister; Hyperlipidemia in her brother and sister; Hypertension in her brother and sister; Liver cancer in her sister.    ROS:  Please see the history of present illness.   Otherwise, review of systems are positive for none.  All other systems are reviewed and negative.    PHYSICAL EXAM: VS:  BP (!) 120/50 (BP Location: Left Arm, Patient Position: Sitting, Cuff Size: Normal)   Pulse 87   Ht 4\' 10"  (1.473 m)   Wt 130 lb 12.8 oz (59.3 kg)   SpO2 94%   BMI 27.34 kg/m  , BMI Body mass index is 27.34 kg/m. GENERAL:   WF in NAD HEENT:  PERRL, EOMI, sclera are clear. Oropharynx is clear. NECK:  No jugular venous distention, carotid upstroke brisk and symmetric, no bruits, no thyromegaly or adenopathy LUNGS:  Clear  to auscultation bilaterally CHEST:  Unremarkable HEART:  RRR,  PMI not displaced or sustained,S1 and S2 within normal limits, no S3, no S4: no clicks, no rubs, no murmurs ABD:  Soft, nontender. BS +, no masses or bruits. No hepatomegaly, no splenomegaly EXT:  1 + pulses throughout, no edema, no cyanosis no clubbing SKIN:  Warm and dry.  No rashes NEURO:  Alert and oriented x 3. Cranial nerves II through XII intact. PSYCH:  Cognitively intact     EKG:  EKG is not done today.    Recent Labs: 12/23/2022: TSH 1.86 02/17/2023: Hemoglobin 14.4; Platelets 259.0; Pro B Natriuretic peptide (BNP) 145.0 08/25/2023: ALT 23; BUN 11; Creatinine, Ser 0.74; Potassium 4.1; Sodium 136   Labs from primary care 12/15/16: A1c 5.6%. : Cholesterol 167 Trig-209, HDL 52, LDL 73.  AST 51, ALT 65. Normal renal indices. TSH is normal. Dated 06/24/17: CRP <0.3. Glucose 120, AST 58, ALT 80, other chemistries normal.  Dated 09/28/18: normal CBC, CMET  Lipid Panel    Component Value Date/Time   CHOL 159 08/25/2023 0000   TRIG 293 08/25/2023 0000   HDL 61 08/25/2023 0000   CHOLHDL 2.6 08/25/2023 0000   CHOLHDL 2 12/22/2022 1110   VLDL 26.0 12/22/2022 1110   LDLCALC 53 08/25/2023 0000   LDLDIRECT 82.0 06/13/2020 1017      Wt Readings from Last 3 Encounters:  08/29/23 130 lb 12.8 oz (59.3 kg)  08/23/23 131 lb 6.3 oz (59.6 kg)  08/16/23 131 lb 9.6 oz (59.7 kg)    Cardiac cath / PCI 02/02/21: CORONARY STENT INTERVENTION  LEFT HEART CATH AND CORS/GRAFTS ANGIOGRAPHY    Conclusion  Conclusions: Severe single-vessel coronary artery disease with chronic total occlusion of mid RCA. Mild to moderate, diffuse LAD disease with competitive flow in the distal vessel from LIMA-LAD. Small but patent LIMA-LAD. Patent SVG-rPDA-rPL with diffuse ostial/proximal disease of up to 60% and tandem hazy 95% and 90% mid graft lesions. Successful PCI to ostial/proximal and mid SVG-rPDA-rPL using non-overlapping Resolute Onyx 3.0  x 26 mm and 3.5 x 34 mm drug-eluting stents with 0% residual stenosis and TIMI-3 flow. Left forearm hematoma following sheath removal ant TR band placement, controlled with manual compression and placement of second TR band proximal to the first band.   Recommendations: Dual antiplatelet therapy with aspirin and ticagrelor for at least 12 months. Aggressive secondary prevention. Close monitoring of left forearm hematoma.   Yvonne Kendall, MD [facility]    Coronary Diagrams   Diagnostic Dominance: Right    Intervention     Implants       Echo 01/31/21: IMPRESSIONS     1. Left ventricular ejection fraction, by estimation, is 60 to 65%. The  left ventricle has normal function. The left ventricle has no regional  wall motion abnormalities. Left ventricular diastolic parameters are  consistent with Grade I diastolic  dysfunction (impaired relaxation).  2. Right ventricular systolic function is normal. The right ventricular  size is normal. There is normal pulmonary artery systolic pressure.   3. Left atrial size was severely dilated.   4. The mitral valve is grossly normal. Mild mitral valve regurgitation:  Apperance of two jets; study may underestimated regurgitation severity.   5. The aortic valve is tricuspid. Aortic valve regurgitation is not  visualized. No aortic stenosis is present.   6. The inferior vena cava is normal in size with greater than 50%  respiratory variability, suggesting right atrial pressure of 3 mmHg.   Comparison(s): A prior study was performed on 01/09/21. No significant  change from prior study. Prior images reviewed side by side. Similar to  prior.    Cardiac cath 06/26/21: Procedures  CORONARY STENT INTERVENTION  Intravascular Ultrasound/IVUS  LEFT HEART CATH AND CORONARY ANGIOGRAPHY   Conclusion      Dist LM lesion is 15% stenosed.   Prox RCA lesion is 75% stenosed.   Mid RCA to Dist RCA lesion is 100% stenosed with 100% stenosed  side branch in RPAV.   Origin to Prox Graft lesion before RPDA  is 80% stenosed.   Non-stenotic Mid Graft-1 lesion before RPDA was previously treated.   Non-stenotic Mid Graft-2 lesion before RPDA was previously treated.   A drug-eluting stent was successfully placed using a STENT ONYX FRONTIER 3.5X26.   Post intervention, there is a 20% residual stenosis.   Seq SVG- and is large.   LIMA and is small.   There is competitive flow.   1.  Stable native vessel coronary artery disease with chronic total occlusion of the proximal to mid RCA, patency of the left main, diffuse mild to moderate calcific nonobstructive LAD stenosis, and moderate nonobstructive proximal left circumflex stenosis 2.  Continued patency of the LIMA to LAD 3.  Continued patency of the sequential saphenous vein graft to PDA and posterolateral branches with severe in-stent restenosis in the proximal stent, both lesions likely related to stent fracture 4.  Successful intravascular ultrasound-guided PCI of the saphenous vein graft to RCA using a 3.5 x 26 mm resolute Onyx DES postdilated to high pressure with a 4.0 mm noncompliant balloon.  Diagnostic Dominance: Right Intervention    Implants    Cardiac cath 09/03/21:  Orbie Pyo, MD (Primary)    [name], Cady Hafen M, MD (Assisting)     Procedures  Intravascular Ultrasound/IVUS  LEFT HEART CATH AND CORS/GRAFTS ANGIOGRAPHY   Conclusion      Dist LM lesion is 15% stenosed.   Prox RCA lesion is 75% stenosed.   Mid RCA to Dist RCA lesion is 100% stenosed with 100% stenosed side branch in RPAV.   Origin to Prox Graft lesion before RPDA  is 20% stenosed.   Non-stenotic Mid Graft-1 lesion before RPDA was previously treated.   Non-stenotic Mid Graft-2 lesion before RPDA was previously treated.   Seq SVG- and is large.   LIMA and is small.   There is competitive flow.   LV end diastolic pressure is normal.    Patent vein graft to PDA with minimal constraint; IVUS  interrogation demonstrated a minimal stent area of 5.52mm2. Patent LIMA to LAD with relatively unchanged burden of disease elsewhere. Normal LVEDP   Recommendations:  Medical management.   Cardiac cath 07/15/22:  LEFT HEART CATH AND CORS/GRAFTS ANGIOGRAPHY   Conclusion      Dist LM lesion is 15% stenosed.   Prox RCA lesion is 75% stenosed.   Mid RCA  to Dist RCA lesion is 100% stenosed with 100% stenosed side branch in RPAV.   Origin to Prox Graft lesion before RPDA  is 30% stenosed.   Prox Graft to Mid Graft lesion before RPDA  is 20% stenosed.   Non-stenotic Mid Graft-1 lesion before RPDA was previously treated.   Non-stenotic Mid Graft-2 lesion before RPDA was previously treated.   Seq SVG- and is large.   LIMA and is small.   There is competitive flow.   The left ventricular systolic function is normal.   LV end diastolic pressure is normal.   The left ventricular ejection fraction is greater than 65% by visual estimate.   2 vessel obstructive CAD Patent LIMA to the LAD Patent SVG to RCA. Mild nonobstructive disease Normal LV function Normal LVEDP   Plan: continue medical therapy.  Coronary Diagrams  Diagnostic Dominance: Right  Echo 04/25/23: IMPRESSIONS     1. Intracavitary gradient. Peak velocity 1.67 m/s. Peak gradient 11 mmHg.  There is no significant change in gradient with Valsalva. Left ventricular  ejection fraction, by estimation, is 65 to 70%. The left ventricle has  normal function. The left  ventricle has no regional wall motion abnormalities. Left ventricular  diastolic parameters are consistent with Grade I diastolic dysfunction  (impaired relaxation).   2. Right ventricular systolic function is normal. The right ventricular  size is normal.   3. Left atrial size was mildly dilated.   4. The mitral valve is normal in structure. No evidence of mitral valve  regurgitation. No evidence of mitral stenosis. Moderate mitral annular  calcification.   5.  The aortic valve is normal in structure. Aortic valve regurgitation is  not visualized. No aortic stenosis is present.   6. The inferior vena cava is normal in size with greater than 50%  respiratory variability, suggesting right atrial pressure of 3 mmHg.   ASSESSMENT AND PLAN:  1. CAD status post CABG in 1995.  s/p NSTEMI in Feb. Cardiac cath demonstrated patent LIMA to the LAD. Occluded native RCA. SVG to PDA/PL had severe sequential stenoses treated with DES x 2. Normal EF.  Developed dyspnea on Brilinta. Allergic reaction to Plavix. Now on Prasugrel lower dose due to age and female sex. Had recurrent angina in July 2022. Restenosis in the proximal stent in the SVG to RCA with some associated stent fracture. Had repeat stenting with IVUS guidance. Last cardiac cath August 2023 showed stable disease with only 30% in stent stenosis in SVG to RCA. Normal LV function and normal LVEDP. She has stable angina. Will continue DAPT with ASA and Effient.   2. HTN with hypertensive heart disease. BP is elevated today but has been in excellent control   3. Hypercholesterolemia- LDL 58 on statin and Zetia.   4.  Status post stent to right renal artery 2008. Patent by CT in August  5. Status post stent graft to abdominal aortic aneurysm 2009.  S/p iliac stent in 2016.follow up with [facility].   6. Peripheral artery disease followed by Dr.Cain  7. Lumbar spine disease. S/p fusion with persistent pain.  8. Severe emphysema. Per Dr [name].    Dispostion: TBD   Signed, Megan Figg [place] MD, [facility]    08/29/2023 4:19 PM    [facility]

## 2023-08-23 NOTE — Telephone Encounter (Signed)
CD del via mail. [facility]

## 2023-08-23 NOTE — Progress Notes (Signed)
Daily Session Note  Patient Details  Name: Megan [name] MRN: [id number] Date of Birth: Nov 20, 1946 Referring Provider:   Doristine Devoid Pulmonary Rehab Walk Test from 06/22/2023 in [facility]  Referring Provider Hunsucker       Encounter Date: 08/23/2023  Check In:  Session Check In - 08/23/23 1105       Check-In   Supervising physician immediately available to respond to emergencies [facility] MD immediately available    Physician(s) Jari Favre, NP    Location [facility]    Staff Present Raford Pitcher, MS, ACSM-CEP, Exercise Physiologist;Leighana Neyman Erin Sons BS, ACSM-CEP, Exercise Physiologist;Samantha Belarus, RD, Dutch Gray, RN, Marton Redwood, MS, ACSM-CEP, CCRP, Exercise Physiologist    Virtual Visit No    Medication changes reported     No    Fall or balance concerns reported    No    Tobacco Cessation No Change    Warm-up and Cool-down Performed as group-led instruction    Resistance Training Performed Yes    VAD Patient? No    PAD/SET Patient? No      Pain Assessment   Currently in Pain? No/denies    Multiple Pain Sites No             Capillary Blood Glucose: No results found. However, due to the size of the patient record, not all encounters were searched. Please check Results Review for a complete set of results.   Exercise Prescription Changes - 08/23/23 1200       Response to Exercise   Blood Pressure (Admit) 124/60    Blood Pressure (Exercise) 144/62    Blood Pressure (Exit) 100/50    Heart Rate (Admit) 75 bpm    Heart Rate (Exercise) 84 bpm    Heart Rate (Exit) 68 bpm    Oxygen Saturation (Admit) 92 %    Oxygen Saturation (Exercise) 90 %    Oxygen Saturation (Exit) 92 %    Rating of Perceived Exertion (Exercise) 11    Perceived Dyspnea (Exercise) 1    Duration Continue with 30 min of aerobic exercise without signs/symptoms of physical distress.    Intensity  THRR unchanged      Progression   Progression Continue to progress workloads to maintain intensity without signs/symptoms of physical distress.      Resistance Training   Training Prescription Yes    Weight blue bands    Reps 10-15    Time 10 Minutes      Oxygen   Oxygen Continuous    Liters 2      Treadmill   MPH 2.7    Grade 1    Minutes 15    METs 3.44      NuStep   Level 4    Minutes 15    METs 3.3      Oxygen   Maintain Oxygen Saturation 88% or higher             Social History   Tobacco Use  Smoking Status Former   Current packs/day: 0.00   Types: Cigarettes   Quit date: 09/05/1996   Years since quitting: 26.9   Passive exposure: Never  Smokeless Tobacco Never    Goals Met:  Proper associated with RPD/PD & O2 Sat Independence with exercise equipment Exercise tolerated well No report of concerns or symptoms today Strength training completed today  Goals Unmet:  Not Applicable  Comments: Service time is from 1010  to 1135.    Dr. Mechele Collin is Medical Director for Pulmonary Rehab at [facility].

## 2023-08-25 ENCOUNTER — Telehealth: Payer: Self-pay | Admitting: Pulmonary Disease

## 2023-08-25 ENCOUNTER — Other Ambulatory Visit: Payer: Self-pay

## 2023-08-25 ENCOUNTER — Encounter (HOSPITAL_COMMUNITY)
Admission: RE | Admit: 2023-08-25 | Discharge: 2023-08-25 | Disposition: A | Discharge status: Home / Self Care | Payer: Medicare Other | Source: Ambulatory Visit | Attending: Pulmonary Disease | Admitting: Pulmonary Disease

## 2023-08-25 DIAGNOSIS — E119 Type 2 diabetes mellitus without complications: Secondary | ICD-10-CM | POA: Diagnosis not present

## 2023-08-25 DIAGNOSIS — I10 Essential (primary) hypertension: Secondary | ICD-10-CM | POA: Diagnosis not present

## 2023-08-25 DIAGNOSIS — E785 Hyperlipidemia, unspecified: Secondary | ICD-10-CM | POA: Diagnosis not present

## 2023-08-25 DIAGNOSIS — I25708 Atherosclerosis of coronary artery bypass graft(s), unspecified, with other forms of angina pectoris: Secondary | ICD-10-CM | POA: Diagnosis not present

## 2023-08-25 DIAGNOSIS — Z95828 Presence of other vascular implants and grafts: Secondary | ICD-10-CM | POA: Diagnosis not present

## 2023-08-25 DIAGNOSIS — J439 Emphysema, unspecified: Secondary | ICD-10-CM

## 2023-08-25 NOTE — Telephone Encounter (Signed)
Pt calling in to get a spacer

## 2023-08-25 NOTE — Progress Notes (Signed)
Daily Session Note  Patient Details  Name: Megan [name] MRN: [id number] Date of Birth: 03-24-1946 Referring Provider:   Doristine Devoid Pulmonary Rehab Walk Test from 06/22/2023 in [facility]  Referring Provider Hunsucker       Encounter Date: 08/25/2023  Check In:  Session Check In - 08/25/23 1129       Check-In   Supervising physician immediately available to respond to emergencies [facility] immediately available    Physician(s) Jari Favre, NP    [facility]    Staff Present Raford Pitcher, MS, ACSM-CEP, Exercise Physiologist;Casey Erin Sons BS, ACSM-CEP, Exercise Physiologist;Mary Gerre Scull, RN, BSN    Virtual Visit No    Medication changes reported     No    Fall or balance concerns reported    No    Tobacco Cessation No Change    Warm-up and Cool-down Performed as group-led instruction    Resistance Training Performed Yes    VAD Patient? No    PAD/SET Patient? No      Pain Assessment   Currently in Pain? No/denies    Multiple Pain Sites No             Capillary Blood Glucose: No results found. However, due to the size of the patient record, not all encounters were searched. Please check Results Review for a complete set of results.    Social History   Tobacco Use  Smoking Status Former   Current packs/day: 0.00   Types: Cigarettes   Quit date: 09/05/1996   Years since quitting: 26.9   Passive exposure: Never  Smokeless Tobacco Never    Goals Met:  Proper associated with RPD/PD & O2 Sat Exercise tolerated well No report of concerns or symptoms today Strength training completed today  Goals Unmet:  Not Applicable  Comments: Service time is from 1020 to 1200.    Dr. Mechele Collin is Medical Director for Pulmonary Rehab at [facility].

## 2023-08-26 ENCOUNTER — Ambulatory Visit: Payer: Medicare Other | Admitting: Pulmonary Disease

## 2023-08-26 LAB — HEMOGLOBIN A1C
Est. average glucose Bld gHb Est-mCnc: 134 mg/dL
Hgb A1c MFr Bld: 6.3 % — ABNORMAL HIGH (ref 4.8–5.6)

## 2023-08-26 LAB — COMPREHENSIVE METABOLIC PANEL
ALT: 23 IU/L
AST: 20 IU/L (ref 0–40)
Albumin: 4 g/dL (ref 3.8–4.8)
Alkaline Phosphatase: 73 IU/L (ref 44–121)
BUN/Creatinine Ratio: 15
BUN: 11 mg/dL
Bilirubin Total: 0.6 mg/dL (ref 0.0–1.2)
CO2: 29 mmol/L (ref 20–29)
Calcium: 10 mg/dL
Chloride: 94 mmol/L — ABNORMAL LOW (ref 96–106)
Creatinine, Ser: 0.74 mg/dL
Globulin, Total: 2.1 g/dL (ref 1.5–4.5)
Glucose: 113 mg/dL — ABNORMAL HIGH (ref 70–99)
Potassium: 4.1 mmol/L (ref 3.5–5.2)
Sodium: 136 mmol/L (ref 134–144)
Total Protein: 6.1 g/dL (ref 6.0–8.5)

## 2023-08-26 LAB — LIPID PANEL
Chol/HDL Ratio: 2.6 ratio
Cholesterol, Total: 159 mg/dL
HDL: 61 mg/dL (ref >39)
LDL Chol Calc (NIH): 53 mg/dL
Triglycerides: 293 mg/dL
VLDL Cholesterol Cal: 45 mg/dL — ABNORMAL HIGH (ref 5–40)

## 2023-08-29 ENCOUNTER — Ambulatory Visit: Payer: Medicare Other | Attending: Cardiology | Admitting: Cardiology

## 2023-08-29 ENCOUNTER — Encounter: Payer: Self-pay | Admitting: Cardiology

## 2023-08-29 VITALS — BP 120/50 | HR 87 | Ht <= 58 in | Wt 130.8 lb

## 2023-08-29 DIAGNOSIS — I25708 Atherosclerosis of coronary artery bypass graft(s), unspecified, with other forms of angina pectoris: Secondary | ICD-10-CM | POA: Diagnosis not present

## 2023-08-29 DIAGNOSIS — E785 Hyperlipidemia, unspecified: Secondary | ICD-10-CM | POA: Diagnosis not present

## 2023-08-29 DIAGNOSIS — E119 Type 2 diabetes mellitus without complications: Secondary | ICD-10-CM

## 2023-08-29 DIAGNOSIS — Z95828 Presence of other vascular implants and grafts: Secondary | ICD-10-CM | POA: Diagnosis not present

## 2023-08-29 DIAGNOSIS — I10 Essential (primary) hypertension: Secondary | ICD-10-CM

## 2023-08-29 MED ORDER — TORSEMIDE 20 MG PO TABS: 20.0000 mg | ORAL_TABLET | Freq: Every day | ORAL | 3 refills | Status: AC

## 2023-08-29 NOTE — Addendum Note (Signed)
Addended by: Neoma Laming on: 08/29/2023 04:22 PM   Modules accepted: Orders

## 2023-08-29 NOTE — Patient Instructions (Signed)
Medication Instructions:  Continue same medications *If you need a refill on your cardiac medications before your next appointment, please call your pharmacy*   Lab Work: None ordered    Testing/Procedures: None ordered   Follow-Up: At [facility], you and your health needs are our priority.  As part of our continuing mission to provide you with exceptional heart care, we have created designated Provider Care Teams.  These Care Teams include your primary Cardiologist (physician) and Advanced Practice Providers (APPs -  Physician Assistants and Nurse Practitioners) who all work together to provide you with the care you need, when you need it.  We recommend signing up for the patient portal called "MyChart".  Sign up information is provided on this After Visit Summary.  MyChart is used to connect with patients for Virtual Visits (Telemedicine).  Patients are able to view lab/test results, encounter notes, upcoming appointments, etc.  Non-urgent messages can be sent to your provider as well.   To learn more about what you can do with MyChart, go to [email or web address].    Your next appointment:  6 months    Provider:  Dr.Jordan

## 2023-08-30 ENCOUNTER — Encounter (HOSPITAL_COMMUNITY)
Admission: RE | Admit: 2023-08-30 | Discharge: 2023-08-30 | Disposition: A | Discharge status: Home / Self Care | Payer: Medicare Other | Source: Ambulatory Visit | Attending: Pulmonary Disease | Admitting: Pulmonary Disease

## 2023-08-30 ENCOUNTER — Other Ambulatory Visit: Payer: Medicare Other

## 2023-08-30 DIAGNOSIS — J439 Emphysema, unspecified: Secondary | ICD-10-CM

## 2023-08-30 NOTE — Progress Notes (Signed)
Daily Session Note  Patient Details  Name: Megan [name] MRN: [id number] Date of Birth: 05/10/1946 Referring Provider:   Doristine Devoid Pulmonary Rehab Walk Test from 06/22/2023 in [facility]  Referring Provider Hunsucker       Encounter Date: 08/30/2023  Check In:  Session Check In - 08/30/23 1157       Check-In   Supervising physician immediately available to respond to emergencies [facility] immediately available    Physician(s) Carlos Levering, NP    [facility]    Staff Present Raford Pitcher, MS, ACSM-CEP, Exercise Physiologist;Casey Erin Sons BS, ACSM-CEP, Exercise Physiologist;Mary Gerre Scull, RN, BSN    Virtual Visit No    Medication changes reported     No    Fall or balance concerns reported    No    Tobacco Cessation No Change    Warm-up and Cool-down Performed as group-led instruction    Resistance Training Performed Yes    VAD Patient? No    PAD/SET Patient? No      Pain Assessment   Currently in Pain? No/denies    Pain Score 0-No pain    Multiple Pain Sites No             Capillary Blood Glucose: No results found. However, due to the size of the patient record, not all encounters were searched. Please check Results Review for a complete set of results.    Social History   Tobacco Use  Smoking Status Former   Current packs/day: 0.00   Types: Cigarettes   Quit date: 09/05/1996   Years since quitting: 27.0   Passive exposure: Never  Smokeless Tobacco Never    Goals Met:  Independence with exercise equipment Exercise tolerated well No report of concerns or symptoms today Strength training completed today  Goals Unmet:  Not Applicable  Comments: Service time is from 1018 to 1144.    Dr. Mechele Collin is Medical Director for Pulmonary Rehab at [facility].

## 2023-08-31 NOTE — Telephone Encounter (Signed)
Patient will come by office and pick up spacer.

## 2023-09-01 ENCOUNTER — Encounter (HOSPITAL_COMMUNITY)
Admission: RE | Admit: 2023-09-01 | Discharge: 2023-09-01 | Disposition: A | Discharge status: Home / Self Care | Payer: Medicare Other | Source: Ambulatory Visit | Attending: Pulmonary Disease | Admitting: Pulmonary Disease

## 2023-09-01 DIAGNOSIS — J439 Emphysema, unspecified: Secondary | ICD-10-CM

## 2023-09-01 NOTE — Progress Notes (Signed)
Daily Session Note  Patient Details  Name: Megan [name] MRN: [id number] Date of Birth: 07-26-1946 Referring Provider:   Doristine Devoid Pulmonary Rehab Walk Test from 06/22/2023 in [facility]  Referring Provider [place]ucker       Encounter Date: 09/01/2023  Check In:  Session Check In - 09/01/23 1038       Check-In   Supervising physician immediately available to respond to emergencies [facility] immediately available    Physician(s) Edd Fabian, NP    [facility]    Staff Present Raford Pitcher, MS, ACSM-CEP, Exercise Physiologist;Mahsa Hanser Erin Sons BS, ACSM-CEP, Exercise Physiologist;Mary Gerre Scull, RN, BSN;Samantha Belarus, RD, LDN    Virtual Visit No    Medication changes reported     No    Fall or balance concerns reported    No    Tobacco Cessation No Change    Warm-up and Cool-down Performed as group-led instruction    Resistance Training Performed Yes    VAD Patient? No    PAD/SET Patient? No      Pain Assessment   Currently in Pain? No/denies    Multiple Pain Sites No             Capillary Blood Glucose: No results found. However, due to the size of the patient record, not all encounters were searched. Please check Results Review for a complete set of results.    Social History   Tobacco Use  Smoking Status Former   Current packs/day: 0.00   Types: Cigarettes   Quit date: 09/05/1996   Years since quitting: 27.0   Passive exposure: Never  Smokeless Tobacco Never    Goals Met:  Proper associated with RPD/PD & O2 Sat Independence with exercise equipment Exercise tolerated well No report of concerns or symptoms today Strength training completed today  Goals Unmet:  Not Applicable  Comments: Service time is from 1018 to 1150.    Dr. Mechele Collin is Medical Director for Pulmonary Rehab at [facility].

## 2023-09-06 ENCOUNTER — Other Ambulatory Visit: Payer: Self-pay

## 2023-09-06 ENCOUNTER — Encounter (HOSPITAL_COMMUNITY)
Admission: RE | Admit: 2023-09-06 | Discharge: 2023-09-06 | Disposition: A | Discharge status: Home / Self Care | Payer: Medicare Other | Source: Ambulatory Visit | Attending: Pulmonary Disease | Admitting: Pulmonary Disease

## 2023-09-06 VITALS — Wt 132.5 lb

## 2023-09-06 DIAGNOSIS — J439 Emphysema, unspecified: Secondary | ICD-10-CM | POA: Diagnosis not present

## 2023-09-06 NOTE — Progress Notes (Signed)
Daily Session Note  Patient Details  Name: Megan [name] MRN: [id number] Date of Birth: 1946/09/08 Referring Provider:   Doristine Devoid Pulmonary Rehab Walk Test from 06/22/2023 in [facility]  Referring Provider [place]ucker       Encounter Date: 09/06/2023  Check In:  Session Check In - 09/06/23 1126       Check-In   Supervising physician immediately available to respond to emergencies [facility] immediately available    Physician(s) Robin Searing, NP    [facility]    Staff Present Raford Pitcher, MS, ACSM-CEP, Exercise Physiologist;Casey Erin Sons BS, ACSM-CEP, Exercise Physiologist;Mary Gerre Scull, RN, BSN;Samantha Belarus, RD, LDN    Virtual Visit No    Medication changes reported     No    Fall or balance concerns reported    No    Tobacco Cessation No Change    Warm-up and Cool-down Performed as group-led instruction    Resistance Training Performed Yes    VAD Patient? No    PAD/SET Patient? No      Pain Assessment   Currently in Pain? No/denies    Multiple Pain Sites No             Capillary Blood Glucose: No results found. However, due to the size of the patient record, not all encounters were searched. Please check Results Review for a complete set of results.   Exercise Prescription Changes - 09/06/23 1200       Response to Exercise   Blood Pressure (Admit) 118/60    Blood Pressure (Exercise) 148/50    Blood Pressure (Exit) 98/52    Heart Rate (Admit) 76 bpm    Heart Rate (Exercise) 91 bpm    Heart Rate (Exit) 77 bpm    Oxygen Saturation (Admit) 91 %    Oxygen Saturation (Exercise) 90 %    Oxygen Saturation (Exit) 92 %    Rating of Perceived Exertion (Exercise) 11    Perceived Dyspnea (Exercise) 1    Duration Continue with 30 min of aerobic exercise without signs/symptoms of physical distress.    Intensity THRR unchanged      Progression   Progression Continue to  progress workloads to maintain intensity without signs/symptoms of physical distress.      Resistance Training   Training Prescription Yes    Weight blue bands    Reps 10-15    Time 10 Minutes      Oxygen   Oxygen Continuous    Liters 2      Treadmill   MPH 2.8    Grade 1.5    Minutes 15    METs 3.4      NuStep   Level 5    SPM 78    Minutes 15    METs 3.4      Oxygen   Maintain Oxygen Saturation 88% or higher             Social History   Tobacco Use  Smoking Status Former   Current packs/day: 0.00   Types: Cigarettes   Quit date: 09/05/1996   Years since quitting: 27.0   Passive exposure: Never  Smokeless Tobacco Never    Goals Met:  Independence with exercise equipment Exercise tolerated well No report of concerns or symptoms today Strength training completed today  Goals Unmet:  Not Applicable  Comments: Service time is from 1008 to 1155.    Dr. Mechele Collin  is Wellsite geologist for Pulmonary Rehab at [facility].

## 2023-09-07 NOTE — Progress Notes (Signed)
Pulmonary Individual Treatment Plan  Patient Details  Name: Megan [name] MRN: [id number] Date of Birth: 1946-10-11 Referring Provider:   Doristine Devoid Pulmonary Rehab Walk Test from 06/22/2023 in [facility]  Referring Provider Hunsucker       Initial Encounter Date:  Flowsheet Row Pulmonary Rehab Walk Test from 06/22/2023 in [facility]  Date 06/22/23       Visit Diagnosis: Pulmonary emphysema, unspecified emphysema type (HCC)  Patient's Home Medications on Admission:   Current Outpatient Medications:    Accu-Chek FastClix Lancets MISC, Check blood sugar once daily PRN, Disp: 100 each, Rfl: 1   albuterol (PROAIR HFA) 108 (90 Base) MCG/ACT inhaler, Inhale 2 puffs into the lungs every 6 (six) hours as needed for wheezing or shortness of breath., Disp: 1 each, Rfl: 11   albuterol (PROVENTIL) (2.5 MG/3ML) 0.083% nebulizer solution, Take 3 mLs (2.5 mg total) by nebulization every 6 (six) hours as needed for wheezing or shortness of breath., Disp: 360 mL, Rfl: 5   ALPRAZolam (XANAX) 0.25 MG tablet, Take 1 tablet (0.25 mg total) by mouth at bedtime as needed for sleep., Disp: 90 tablet, Rfl: 3   amLODipine (NORVASC) 5 MG tablet, Take 1 tablet (5 mg total) by mouth daily., Disp: 90 tablet, Rfl: 3   aspirin EC 81 MG tablet, Take 1 tablet (81 mg total) by mouth daily., Disp: 90 tablet, Rfl: 3   atorvastatin (LIPITOR) 80 MG tablet, TAKE 1 TABLET BY MOUTH EVERY DAY, Disp: 90 tablet, Rfl: 3   B Complex-Biotin-FA (B-100 COMPLEX PO), Take 1 tablet by mouth every evening., Disp: , Rfl:    bismuth subsalicylate (PEPTO BISMOL) 262 MG/15ML suspension, Take 30 mLs by mouth every 6 (six) hours as needed for indigestion or diarrhea or loose stools., Disp: , Rfl:    Budeson-Glycopyrrol-Formoterol (BREZTRI AEROSPHERE) 160-9-4.8 MCG/ACT AERO, Inhale 2 puffs into the lungs in the morning and  at bedtime., Disp: 1 each, Rfl: 11   cetirizine (ZYRTEC) 10 MG tablet, Take 10 mg by mouth daily as needed for allergies., Disp: , Rfl:    cholecalciferol (VITAMIN D3) 25 MCG (1000 UNIT) tablet, Take 1,000 Units by mouth every evening., Disp: , Rfl:    CORDRAN 4 MCG/SQCM TAPE, Apply 1 each topically daily as needed (Rash). , Disp: , Rfl:    cycloSPORINE (RESTASIS) 0.05 % ophthalmic emulsion, Place 1 drop into both eyes 2 (two) times daily. , Disp: , Rfl:    Ergocalciferol 50 MCG (2000 UT) CAPS, Take by mouth., Disp: , Rfl:    ezetimibe (ZETIA) 10 MG tablet, TAKE 1 TABLET BY MOUTH EVERY DAY, Disp: 90 tablet, Rfl: 3   gabapentin (NEURONTIN) 300 MG capsule, Take 1 capsule (300 mg total) by mouth 2 (two) times daily., Disp: 270 capsule, Rfl: 3   glucose blood (ACCU-CHEK SMARTVIEW) test strip, CHECK BLOOD SUGAR ONCE DAILY AS NEEDED, Disp: 100 strip, Rfl: 1   hydrochlorothiazide (HYDRODIURIL) 12.5 MG tablet, TAKE 1 TABLET BY MOUTH EVERY DAY, Disp: 90 tablet, Rfl: 3   isosorbide mononitrate (IMDUR) 30 MG 24 hr tablet, TAKE 3 TABLETS ( 90 MG ) EVERY MORNING AND 1 TABLET (30 MG ) EVERY EVENING, Disp: 360 tablet, Rfl: 1   losartan (COZAAR) 100 MG tablet, TAKE 1 TABLET BY MOUTH EVERY DAY, Disp: 90 tablet, Rfl: 3   metoprolol succinate (TOPROL-XL) 25 MG 24 hr tablet, TAKE 1 TABLET (25 MG TOTAL) BY  MOUTH DAILY., Disp: 90 tablet, Rfl: 2   Multiple Vitamins-Minerals (MULTIVITAMIN WITH MINERALS) tablet, Take 1 tablet by mouth every evening., Disp: , Rfl:    nitroGLYCERIN (NITROSTAT) 0.4 MG SL tablet, Place 1 tablet (0.4 mg total) under the tongue every 5 (five) minutes as needed for chest pain., Disp: 25 tablet, Rfl: 11   omeprazole (PRILOSEC) 40 MG capsule, Take 1 capsule (40 mg total) by mouth in the morning and at bedtime., Disp: 180 capsule, Rfl: 3   ondansetron (ZOFRAN) 8 MG tablet, Take 1 tablet (8 mg total) by mouth every 6 (six) hours as needed for nausea or vomiting., Disp: 20 tablet, Rfl: 0    oxyCODONE-acetaminophen (PERCOCET) 10-325 MG tablet, Take 1 tablet by mouth in the morning and at bedtime. Can take one tablet three times a day as needed for back pain, Disp: , Rfl:    polyethylene glycol (MIRALAX / GLYCOLAX) 17 g packet, Take 17 g by mouth daily as needed for moderate constipation., Disp: , Rfl:    prasugrel (EFFIENT) 5 MG TABS tablet, TAKE 1 TABLET (5 MG TOTAL) BY MOUTH DAILY., Disp: 90 tablet, Rfl: 3   PRESCRIPTION MEDICATION, Place 2 puffs into both ears daily as needed (ear infections). [facility] ear powder, Disp: , Rfl:    Simethicone (GAS-X PO), Take 1-2 tablets by mouth daily as needed (gas)., Disp: , Rfl:    torsemide (DEMADEX) 20 MG tablet, Take 1 tablet (20 mg total) by mouth daily. Take 1/2 tablet daily as needed for swelling, Disp: 90 tablet, Rfl: 3   tretinoin (RETIN-A) 0.1 % cream, Apply 1 application. topically every evening., Disp: , Rfl:   Past Medical History: Past Medical History:  Diagnosis Date   AAA (abdominal aortic aneurysm) (HCC)    a. s/p stent grafting in 2009.   Adenomatous colon polyp 05/2001   Adenomatous duodenal polyp    Allergy    Anginal pain (HCC)    COPD (chronic obstructive pulmonary disease) (HCC)    pt denies COPD   Coronary artery disease    a. s/p CABG in 1995;  b. 05/2013 Neg MV, EF 82%;  c. 06/2013 Cath: LM nl, LAD 40-50p, LCX nl, RCA 80p/118m, VG->RCA->PDA->PLSA min irregs, LIMA->LAD atretic, vigorous LV fxn.   Diabetes mellitus without complication (HCC)    history of, resolved. 2017   Diverticulosis    Eczema, dyshidrotic    Eczema, dyshidrotic 1986   Dr. Gerilyn Nestle   Emphysema of lung [facility])    Fatty liver    Fibromyalgia    GERD (gastroesophageal reflux disease)    Hyperlipidemia    Hypertension    Migraine    Myocardial infarction [facility])    Osteoarthritis    Osteoporosis    unsure, having a bone scan soon   Peripheral arterial disease (HCC)    left leg diagnosed by Dr Patty Sermons   Renal artery stenosis [facility])    a.  2008 s/p PTA    Tobacco Use: Social History   Tobacco Use  Smoking Status Former   Current packs/day: 0.00   Types: Cigarettes   Quit date: 09/05/1996   Years since quitting: 27.0   Passive exposure: Never  Smokeless Tobacco Never    Labs: Review Flowsheet  More data exists      Latest Ref Rng & Units 01/06/2022 04/05/2022 06/21/2022 12/22/2022 08/25/2023  Labs for ITP Cardiac and Pulmonary Rehab  Cholestrol mg/dL 161  096  - 045  409   LDL (calc) mg/dL 74  57  -  58  53   HDL-C >39 mg/dL 52  51  - 72.53  61   Trlycerides mg/dL 664  403  - 474.2  595   Hemoglobin A1c 4.8 - 5.6 % 5.8  - 5.8  6.1  5.7  6.3     Details       Multiple values from one day are sorted in reverse-chronological order         Capillary Blood Glucose: Lab Results  Component Value Date   GLUCAP 102 (H) 03/24/2022   GLUCAP 91 09/03/2021   GLUCAP 113 (H) 02/03/2021   GLUCAP 91 02/03/2021   GLUCAP 125 (H) 02/02/2021     Pulmonary Assessment Scores:  Pulmonary Assessment Scores     Row Name 06/22/23 1128 09/06/23 1529       ADL UCSD   ADL Phase Entry Exit    SOB Score total 74 34      CAT Score   CAT Score 31 27      mMRC Score   mMRC Score 2 --            UCSD: Self-administered rating of dyspnea associated with activities of daily living (ADLs) 6-point scale (0 = "not at all" to 5 = "maximal or unable to do because of breathlessness")  Scoring Scores range from 0 to 120.  Minimally important difference is 5 units  CAT: CAT can identify the health impairment of COPD patients and is better correlated with disease progression.  CAT has a scoring range of zero to 40. The CAT score is classified into four groups of low (less than 10), medium (10 - 20), high (21-30) and very high (31-40) based on the impact level of disease on health status. A CAT score over 10 suggests significant symptoms.  A worsening CAT score could be explained by an exacerbation, poor medication adherence, poor  inhaler technique, or progression of COPD or comorbid conditions.  CAT MCID is 2 points  mMRC: mMRC (Modified Medical Research Council) Dyspnea Scale is used to assess the degree of baseline functional disability in patients of respiratory disease due to dyspnea. No minimal important difference is established. A decrease in score of 1 point or greater is considered a positive change.   Pulmonary Function Assessment:  Pulmonary Function Assessment - 06/22/23 1127       Breath   Bilateral Breath Sounds Rhonchi;Decreased    Shortness of Breath Limiting activity;Yes;Panic with Shortness of Breath             Exercise Target Goals: Exercise Program Goal: Individual exercise prescription set using results from initial 6 min walk test and THRR while considering  patient's activity barriers and safety.   Exercise Prescription Goal: Initial exercise prescription builds to 30-45 minutes a day of aerobic activity, 2-3 days per week.  Home exercise guidelines will be given to patient during program as part of exercise prescription that the participant will acknowledge.  Activity Barriers & Risk Stratification:  Activity Barriers & Cardiac Risk Stratification - 06/22/23 1124       Activity Barriers & Cardiac Risk Stratification   Activity Barriers Deconditioning;Muscular Weakness;Shortness of Breath;Back Problems;Balance Concerns;Arthritis;History of Falls;Assistive Device   walk with cane   Cardiac Risk Stratification High             6 Minute Walk:  6 Minute Walk     Row Name 06/22/23 1029         6 Minute Walk   Phase Initial  Distance 780 feet     Walk Time 6 minutes     # of Rest Breaks 0     MPH 1.48     METS 1.32     RPE 6.5     Perceived Dyspnea  0     VO2 Peak 4.61     Symptoms No     Resting HR 69 bpm     Resting BP 102/52     Resting Oxygen Saturation  91 %     Exercise Oxygen Saturation  during 6 min walk 86 %     Max Ex. HR 82 bpm     Max Ex. BP  138/66     2 Minute Post BP 120/54       Interval HR   1 Minute HR 77     2 Minute HR 80     3 Minute HR 82     4 Minute HR 79     5 Minute HR 79     6 Minute HR 80     2 Minute Post HR 71     Interval Heart Rate? Yes       Interval Oxygen   Interval Oxygen? Yes     Baseline Oxygen Saturation % 91 %     1 Minute Oxygen Saturation % 93 %     1 Minute Liters of Oxygen 0 L     2 Minute Oxygen Saturation % 88 %     2 Minute Liters of Oxygen 0 L     3 Minute Oxygen Saturation % 86 %  2:46     3 Minute Liters of Oxygen 0 L  increased to 1L     4 Minute Oxygen Saturation % 90 %     4 Minute Liters of Oxygen 1 L     5 Minute Oxygen Saturation % 89 %     5 Minute Liters of Oxygen 1 L     6 Minute Oxygen Saturation % 91 %     6 Minute Liters of Oxygen 1 L     2 Minute Post Oxygen Saturation % 98 %     2 Minute Post Liters of Oxygen 1 L              Oxygen Initial Assessment:  Oxygen Initial Assessment - 06/22/23 1126       Home Oxygen   Home Oxygen Device Home Concentrator;Portable Concentrator    Sleep Oxygen Prescription Continuous    Liters per minute 2    Home Exercise Oxygen Prescription Pulsed    Liters per minute 4    Home Resting Oxygen Prescription Continuous    Liters per minute 2    Compliance with Home Oxygen Use Yes      Initial 6 min Walk   Oxygen Used Continuous    Liters per minute 1      Program Oxygen Prescription   Program Oxygen Prescription Continuous    Liters per minute 1      Intervention   Short Term Goals To learn and exhibit compliance with exercise, home and travel O2 prescription;To learn and understand importance of maintaining oxygen saturations>88%;To learn and demonstrate proper use of respiratory medications;To learn and understand importance of monitoring SPO2 with pulse oximeter and demonstrate accurate use of the pulse oximeter.;To learn and demonstrate proper pursed lip breathing techniques or other breathing techniques.      Long  Term Goals Exhibits compliance with exercise, home  and  travel O2 prescription;Maintenance of O2 saturations>88%;Compliance with respiratory medication;Verbalizes importance of monitoring SPO2 with pulse oximeter and return demonstration;Exhibits proper breathing techniques, such as pursed lip breathing or other method taught during program session;Demonstrates proper use of MDI's             Oxygen Re-Evaluation:  Oxygen Re-Evaluation     Row Name 07/01/23 1046 08/03/23 1111 08/31/23 0844         Program Oxygen Prescription   Program Oxygen Prescription Continuous Continuous Continuous     Liters per minute 1 1 2        Home Oxygen   Home Oxygen Device Home Concentrator;Portable Concentrator Home Concentrator;Portable Concentrator Home Concentrator;Portable Concentrator     Sleep Oxygen Prescription Continuous Continuous Continuous     Liters per minute 2 2 2      Home Exercise Oxygen Prescription Pulsed Pulsed Pulsed     Liters per minute 4 4 4      Home Resting Oxygen Prescription Continuous Continuous Continuous     Liters per minute 2 2 2      Compliance with Home Oxygen Use Yes Yes Yes       Goals/Expected Outcomes   Short Term Goals To learn and exhibit compliance with exercise, home and travel O2 prescription;To learn and understand importance of maintaining oxygen saturations>88%;To learn and demonstrate proper use of respiratory medications;To learn and understand importance of monitoring SPO2 with pulse oximeter and demonstrate accurate use of the pulse oximeter.;To learn and demonstrate proper pursed lip breathing techniques or other breathing techniques.  To learn and exhibit compliance with exercise, home and travel O2 prescription;To learn and understand importance of maintaining oxygen saturations>88%;To learn and demonstrate proper use of respiratory medications;To learn and understand importance of monitoring SPO2 with pulse oximeter and demonstrate accurate use of  the pulse oximeter.;To learn and demonstrate proper pursed lip breathing techniques or other breathing techniques.  To learn and exhibit compliance with exercise, home and travel O2 prescription;To learn and understand importance of maintaining oxygen saturations>88%;To learn and demonstrate proper use of respiratory medications;To learn and understand importance of monitoring SPO2 with pulse oximeter and demonstrate accurate use of the pulse oximeter.;To learn and demonstrate proper pursed lip breathing techniques or other breathing techniques.      Long  Term Goals Exhibits compliance with exercise, home  and travel O2 prescription;Maintenance of O2 saturations>88%;Compliance with respiratory medication;Verbalizes importance of monitoring SPO2 with pulse oximeter and return demonstration;Exhibits proper breathing techniques, such as pursed lip breathing or other method taught during program session;Demonstrates proper use of MDI's Exhibits compliance with exercise, home  and travel O2 prescription;Maintenance of O2 saturations>88%;Compliance with respiratory medication;Verbalizes importance of monitoring SPO2 with pulse oximeter and return demonstration;Exhibits proper breathing techniques, such as pursed lip breathing or other method taught during program session;Demonstrates proper use of MDI's Exhibits compliance with exercise, home  and travel O2 prescription;Maintenance of O2 saturations>88%;Compliance with respiratory medication;Verbalizes importance of monitoring SPO2 with pulse oximeter and return demonstration;Exhibits proper breathing techniques, such as pursed lip breathing or other method taught during program session;Demonstrates proper use of MDI's     Goals/Expected Outcomes Compliance and understanding of oxygen saturation monitoring and breathing techniques to decrease shortness of breath. Compliance and understanding of oxygen saturation monitoring and breathing techniques to decrease shortness  of breath. Compliance and understanding of oxygen saturation monitoring and breathing techniques to decrease shortness of breath.              Oxygen Discharge (Final Oxygen Re-Evaluation):  Oxygen Re-Evaluation -  08/31/23 0844       Program Oxygen Prescription   Program Oxygen Prescription Continuous    Liters per minute 2      Home Oxygen   Home Oxygen Device Home Concentrator;Portable Concentrator    Sleep Oxygen Prescription Continuous    Liters per minute 2    Home Exercise Oxygen Prescription Pulsed    Liters per minute 4    Home Resting Oxygen Prescription Continuous    Liters per minute 2    Compliance with Home Oxygen Use Yes      Goals/Expected Outcomes   Short Term Goals To learn and exhibit compliance with exercise, home and travel O2 prescription;To learn and understand importance of maintaining oxygen saturations>88%;To learn and demonstrate proper use of respiratory medications;To learn and understand importance of monitoring SPO2 with pulse oximeter and demonstrate accurate use of the pulse oximeter.;To learn and demonstrate proper pursed lip breathing techniques or other breathing techniques.     Long  Term Goals Exhibits compliance with exercise, home  and travel O2 prescription;Maintenance of O2 saturations>88%;Compliance with respiratory medication;Verbalizes importance of monitoring SPO2 with pulse oximeter and return demonstration;Exhibits proper breathing techniques, such as pursed lip breathing or other method taught during program session;Demonstrates proper use of MDI's    Goals/Expected Outcomes Compliance and understanding of oxygen saturation monitoring and breathing techniques to decrease shortness of breath.             Initial Exercise Prescription:  Initial Exercise Prescription - 06/22/23 1200       Date of Initial Exercise RX and Referring Provider   Date 06/22/23    Referring Provider Hunsucker    Expected Discharge Date 09/15/23       Oxygen   Oxygen Continuous    Liters 1    Maintain Oxygen Saturation 88% or higher      Treadmill   MPH 2    Grade 0    Minutes 15      NuStep   Level 2    SPM 60    Minutes 15    METs 2.5      Prescription Details   Frequency (times per week) 2    Duration Progress to 30 minutes of continuous aerobic without signs/symptoms of physical distress      Intensity   THRR 40-80% of Max Heartrate 57-114    Ratings of Perceived Exertion 11-13    Perceived Dyspnea 0-4      Progression   Progression Continue to progress workloads to maintain intensity without signs/symptoms of physical distress.      Resistance Training   Training Prescription Yes    Weight red bands    Reps 10-15             Perform Capillary Blood Glucose checks as needed.  Exercise Prescription Changes:   Exercise Prescription Changes     Row Name 06/28/23 1200 07/12/23 1200 07/26/23 1200 08/09/23 1100 08/23/23 1200     Response to Exercise   Blood Pressure (Admit) 102/54 104/56 102/58 108/54 124/60   Blood Pressure (Exercise) 108/66 138/66 150/52 144/62 144/62   Blood Pressure (Exit) 98/60 118/56 118/56 110/62 100/50   Heart Rate (Admit) 79 bpm 71 bpm 74 bpm 68 bpm 75 bpm   Heart Rate (Exercise) 89 bpm 95 bpm 90 bpm 85 bpm 84 bpm   Heart Rate (Exit) 70 bpm 70 bpm 75 bpm 78 bpm 68 bpm   Oxygen Saturation (Admit) 93 % 92 % 93 % 96 % 92 %  Oxygen Saturation (Exercise) 91 % 96 % 90 % 92 % 90 %   Oxygen Saturation (Exit) 94 % 94 % 91 % 93 % 92 %   Rating of Perceived Exertion (Exercise) 10 11 11 11 11    Perceived Dyspnea (Exercise) 0 0 1 1 1    Duration Progress to 30 minutes of  aerobic without signs/symptoms of physical distress Continue with 30 min of aerobic exercise without signs/symptoms of physical distress. Continue with 30 min of aerobic exercise without signs/symptoms of physical distress. Continue with 30 min of aerobic exercise without signs/symptoms of physical distress. Continue with 30  min of aerobic exercise without signs/symptoms of physical distress.   Intensity THRR unchanged THRR unchanged THRR unchanged THRR unchanged THRR unchanged     Progression   Progression Continue to progress workloads to maintain intensity without signs/symptoms of physical distress. Continue to progress workloads to maintain intensity without signs/symptoms of physical distress. Continue to progress workloads to maintain intensity without signs/symptoms of physical distress. Continue to progress workloads to maintain intensity without signs/symptoms of physical distress. Continue to progress workloads to maintain intensity without signs/symptoms of physical distress.     Resistance Training   Training Prescription Yes Yes Yes Yes Yes   Weight red bands red bands red bands blue bands blue bands   Reps 10-15 10-15 10-15 10-15 10-15   Time 10 Minutes 10 Minutes 10 Minutes 10 Minutes 10 Minutes     Oxygen   Oxygen Continuous Continuous Continuous Continuous Continuous   Liters 1 2-3 2 2 2      Treadmill   MPH 2 2.5 2.5 2.7 2.7   Grade 0 1 1 1 1    Minutes 15 15 15 15 15    METs 2.53 3.26 3.26 3.44 3.44     NuStep   Level 2 3 4 4 4    SPM 54 -- -- -- --   Minutes 15 15 15 15 15    METs 1.6 2.3 2.9 2.9 3.3     Oxygen   Maintain Oxygen Saturation 88% or higher 88% or higher 88% or higher 88% or higher 88% or higher    Row Name 09/06/23 1200             Response to Exercise   Blood Pressure (Admit) 118/60       Blood Pressure (Exercise) 148/50       Blood Pressure (Exit) 98/52       Heart Rate (Admit) 76 bpm       Heart Rate (Exercise) 91 bpm       Heart Rate (Exit) 77 bpm       Oxygen Saturation (Admit) 91 %       Oxygen Saturation (Exercise) 90 %       Oxygen Saturation (Exit) 92 %       Rating of Perceived Exertion (Exercise) 11       Perceived Dyspnea (Exercise) 1       Duration Continue with 30 min of aerobic exercise without signs/symptoms of physical distress.        Intensity THRR unchanged         Progression   Progression Continue to progress workloads to maintain intensity without signs/symptoms of physical distress.         Resistance Training   Training Prescription Yes       Weight blue bands       Reps 10-15       Time 10 Minutes  Oxygen   Oxygen Continuous       Liters 2         Treadmill   MPH 2.8       Grade 1.5       Minutes 15       METs 3.4         NuStep   Level 5       SPM 78       Minutes 15       METs 3.4         Oxygen   Maintain Oxygen Saturation 88% or higher                Exercise Comments:   Exercise Comments     Row Name 06/28/23 1555 08/04/23 1516         Exercise Comments Pt completed first day of exercise. Megan [name] exercised for 15 min on the treadmill and Nustep. Megan [name] averaged 2.53 METs at 2 mph and 0 incline on the treadmill and 1.6 METs at level 2 on the Nustep. Megan [name] performed the warmup and cooldown holding onto a chair for balance. Discussed METs and how to increase METs. Completed home ExRx. Megan [name] is currently exercising at home. She walks on the treadmill 7 days/wk for 30 min/day. Megan [name] is exceeding minimum exercise guidelines. She has a great understanding of exercise guidelines and knows the importance of a home exercise regimen. Megan [name] mentioned walking outside when the weather is nicer. She understands weather guidelines. I am confident in Megan [name] completing an exercise regimen at home. She is motivated to exercise and improve her functional capacity.               Exercise Goals and Review:   Exercise Goals     Row Name 06/22/23 1125 07/01/23 1044 08/03/23 1058         Exercise Goals   Increase Physical Activity Yes Yes Yes     Intervention Provide advice, education, support and counseling about physical activity/exercise needs.;Develop an individualized exercise prescription for aerobic and resistive training based on initial evaluation findings, risk stratification,  comorbidities and participant's personal goals. Provide advice, education, support and counseling about physical activity/exercise needs.;Develop an individualized exercise prescription for aerobic and resistive training based on initial evaluation findings, risk stratification, comorbidities and participant's personal goals. Provide advice, education, support and counseling about physical activity/exercise needs.;Develop an individualized exercise prescription for aerobic and resistive training based on initial evaluation findings, risk stratification, comorbidities and participant's personal goals.     Expected Outcomes Short Term: Attend rehab on a regular basis to increase amount of physical activity.;Long Term: Exercising regularly at least 3-5 days a week.;Long Term: Add in home exercise to make exercise part of routine and to increase amount of physical activity. Short Term: Attend rehab on a regular basis to increase amount of physical activity.;Long Term: Exercising regularly at least 3-5 days a week.;Long Term: Add in home exercise to make exercise part of routine and to increase amount of physical activity. Short Term: Attend rehab on a regular basis to increase amount of physical activity.;Long Term: Exercising regularly at least 3-5 days a week.;Long Term: Add in home exercise to make exercise part of routine and to increase amount of physical activity.     Increase Strength and Stamina Yes Yes Yes     Intervention Provide advice, education, support and counseling about physical activity/exercise needs.;Develop an individualized exercise prescription for aerobic and resistive training based on initial evaluation findings, risk stratification,  comorbidities and participant's personal goals. Provide advice, education, support and counseling about physical activity/exercise needs.;Develop an individualized exercise prescription for aerobic and resistive training based on initial evaluation findings,  risk stratification, comorbidities and participant's personal goals. Provide advice, education, support and counseling about physical activity/exercise needs.;Develop an individualized exercise prescription for aerobic and resistive training based on initial evaluation findings, risk stratification, comorbidities and participant's personal goals.     Expected Outcomes Short Term: Increase workloads from initial exercise prescription for resistance, speed, and METs.;Short Term: Perform resistance training exercises routinely during rehab and add in resistance training at home;Long Term: Improve cardiorespiratory fitness, muscular endurance and strength as measured by increased METs and functional capacity ( ) Short Term: Increase workloads from initial exercise prescription for resistance, speed, and METs.;Short Term: Perform resistance training exercises routinely during rehab and add in resistance training at home;Long Term: Improve cardiorespiratory fitness, muscular endurance and strength as measured by increased METs and functional capacity ( ) Short Term: Increase workloads from initial exercise prescription for resistance, speed, and METs.;Short Term: Perform resistance training exercises routinely during rehab and add in resistance training at home;Long Term: Improve cardiorespiratory fitness, muscular endurance and strength as measured by increased METs and functional capacity ( )     Able to understand and use rate of perceived exertion (RPE) scale Yes Yes Yes     Intervention Provide education and explanation on how to use RPE scale Provide education and explanation on how to use RPE scale Provide education and explanation on how to use RPE scale     Expected Outcomes Short Term: Able to use RPE daily in rehab to express subjective intensity level;Long Term:  Able to use RPE to guide intensity level when exercising independently Short Term: Able to use RPE daily in rehab to express subjective  intensity level;Long Term:  Able to use RPE to guide intensity level when exercising independently Short Term: Able to use RPE daily in rehab to express subjective intensity level;Long Term:  Able to use RPE to guide intensity level when exercising independently     Able to understand and use Dyspnea scale Yes Yes Yes     Intervention Provide education and explanation on how to use Dyspnea scale Provide education and explanation on how to use Dyspnea scale Provide education and explanation on how to use Dyspnea scale     Expected Outcomes Short Term: Able to use Dyspnea scale daily in rehab to express subjective sense of shortness of breath during exertion;Long Term: Able to use Dyspnea scale to guide intensity level when exercising independently Short Term: Able to use Dyspnea scale daily in rehab to express subjective sense of shortness of breath during exertion;Long Term: Able to use Dyspnea scale to guide intensity level when exercising independently Short Term: Able to use Dyspnea scale daily in rehab to express subjective sense of shortness of breath during exertion;Long Term: Able to use Dyspnea scale to guide intensity level when exercising independently     Knowledge and understanding of Target Heart Rate Range (THRR) Yes Yes Yes     Intervention Provide education and explanation of THRR including how the numbers were predicted and where they are located for reference Provide education and explanation of THRR including how the numbers were predicted and where they are located for reference Provide education and explanation of THRR including how the numbers were predicted and where they are located for reference     Expected Outcomes Short Term: Able to state/look up THRR;Long Term: Able to use THRR  to govern intensity when exercising independently;Short Term: Able to use daily as guideline for intensity in rehab Short Term: Able to state/look up THRR;Long Term: Able to use THRR to govern intensity  when exercising independently;Short Term: Able to use daily as guideline for intensity in rehab Short Term: Able to state/look up THRR;Long Term: Able to use THRR to govern intensity when exercising independently;Short Term: Able to use daily as guideline for intensity in rehab     Able to check pulse independently Yes Yes --     Understanding of Exercise Prescription Yes Yes Yes     Intervention Provide education, explanation, and written materials on patient's individual exercise prescription Provide education, explanation, and written materials on patient's individual exercise prescription Provide education, explanation, and written materials on patient's individual exercise prescription     Expected Outcomes Short Term: Able to explain program exercise prescription;Long Term: Able to explain home exercise prescription to exercise independently Short Term: Able to explain program exercise prescription;Long Term: Able to explain home exercise prescription to exercise independently Short Term: Able to explain program exercise prescription;Long Term: Able to explain home exercise prescription to exercise independently              Exercise Goals Re-Evaluation :  Exercise Goals Re-Evaluation     Row Name 07/01/23 1044 08/03/23 1100 08/31/23 0850         Exercise Goal Re-Evaluation   Exercise Goals Review Increase Physical Activity;Able to understand and use Dyspnea scale;Understanding of Exercise Prescription;Increase Strength and Stamina;Knowledge and understanding of Target Heart Rate Range (THRR);Able to understand and use rate of perceived exertion (RPE) scale Increase Physical Activity;Able to understand and use Dyspnea scale;Understanding of Exercise Prescription;Increase Strength and Stamina;Knowledge and understanding of Target Heart Rate Range (THRR);Able to understand and use rate of perceived exertion (RPE) scale Increase Physical Activity;Able to understand and use Dyspnea  scale;Understanding of Exercise Prescription;Increase Strength and Stamina;Knowledge and understanding of Target Heart Rate Range (THRR);Able to understand and use rate of perceived exertion (RPE) scale     Comments Megan [name] has completed 2 exercise sessions. She exercises for 15 min on the treadmill and Nustep. Megan [name] averages 2.15 METs at 1.5 mph and 0 incline on the treadmill and 1.6 MET at level 2 on the Nustep. She performs the warmup and cooldown standing holding onto a chair for balance. It is too soon to note any discernable progressions. Will continue to monitor and progress as able. Megan [name] has completed 11 exercise sessions. She exercises for 15 min on the treadmill and Nustep. Megan [name] averages 3.44 METs at 2.7 mph and 1 incline on the treadmill and 2.6 MET at level 4 on the Nustep. She performs the warmup and cooldown standing holding onto a chair for balance. She has increased her workload for both exercise modes. Her speed had increased from 2.5 to 2.7 mph and incline has increased for 0-1. Her level on the Nustep has increased from 2 to 4. Megan [name] has tolerated these increases well. Will continue to monitor and progress as able. Megan [name] has completed 11 exercise sessions. She exercises for 15 min on the treadmill and Nustep. Megan [name] averages 3.44 METs at 2.7 mph and 1 incline on the treadmill and 2.7 MET at level 4 on the Nustep. She performs the warmup and cooldown standing holding onto a chair for balance. She has not increased her workload for either exercise mode. Will encourage her to increase workloads next session. We have recently discussed home exercise as Megan [name] does exercise outside of  rehab. I am confident in her carrying out a home exercise regimen. Will continue to monitor and progress as able.     Expected Outcomes Through exercise at rehab and home, the patient will decrease shortness of breath with daily activities and feel confident in carrying out an exercise regimen at home. Through  exercise at rehab and home, the patient will decrease shortness of breath with daily activities and feel confident in carrying out an exercise regimen at home. Through exercise at rehab and home, the patient will decrease shortness of breath with daily activities and feel confident in carrying out an exercise regimen at home.              Discharge Exercise Prescription (Final Exercise Prescription Changes):  Exercise Prescription Changes - 09/06/23 1200       Response to Exercise   Blood Pressure (Admit) 118/60    Blood Pressure (Exercise) 148/50    Blood Pressure (Exit) 98/52    Heart Rate (Admit) 76 bpm    Heart Rate (Exercise) 91 bpm    Heart Rate (Exit) 77 bpm    Oxygen Saturation (Admit) 91 %    Oxygen Saturation (Exercise) 90 %    Oxygen Saturation (Exit) 92 %    Rating of Perceived Exertion (Exercise) 11    Perceived Dyspnea (Exercise) 1    Duration Continue with 30 min of aerobic exercise without signs/symptoms of physical distress.    Intensity THRR unchanged      Progression   Progression Continue to progress workloads to maintain intensity without signs/symptoms of physical distress.      Resistance Training   Training Prescription Yes    Weight blue bands    Reps 10-15    Time 10 Minutes      Oxygen   Oxygen Continuous    Liters 2      Treadmill   MPH 2.8    Grade 1.5    Minutes 15    METs 3.4      NuStep   Level 5    SPM 78    Minutes 15    METs 3.4      Oxygen   Maintain Oxygen Saturation 88% or higher             Nutrition:  Target Goals: Understanding of nutrition guidelines, daily intake of sodium 1500mg , cholesterol 200mg , calories 30% from fat and 7% or less from saturated fats, daily to have 5 or more servings of fruits and vegetables.  Biometrics:  Pre Biometrics - 06/22/23 1153       Pre Biometrics   Grip Strength 22 kg              Nutrition Therapy Plan and Nutrition Goals:  Nutrition Therapy & Goals - 08/23/23  1159       Nutrition Therapy   Diet Heart Healthy Diet    Drug/Food Interactions Statins/Certain Fruits      Personal Nutrition Goals   Nutrition Goal Patient to improve diet quality by using the plate method as a guide for meal planning to include lean protein/plant protein, fruits, vegetables, whole grains, nonfat dairy as part of a well-balanced diet.   goal not met.   Personal Goal #2 Patient to reduce sodium intake 2300mg  per day   goal in progress.   Comments Megan [name] remains precontemplative toward significant nutrition changes. She has met goals for sodium intake. Megan [name] reports that she "does not eat well". She reports snacking throughout the day and eating  1-2 meals daily and over eating refined sugar foods; she drinks hot tea with sugar and beer/ wine regularly. She often chooses frozen meals such as Stouffer's,etc. Megan [name] continues to monitor sodium intake and weigh regularly.Megan [name] is down 2.2# since starting with our program; BMI remains appropriate for age. Megan [name] will continue to benefit from participation in pulmonary rehab for nutrition and exercise support.      Intervention Plan   Intervention Prescribe, educate and counsel regarding individualized specific dietary modifications aiming towards targeted core components such as weight, hypertension, lipid management, diabetes, heart failure and other comorbidities.;Nutrition handout(s) given to patient.    Expected Outcomes Short Term Goal: Understand basic principles of dietary content, such as calories, fat, sodium, cholesterol and nutrients.;Long Term Goal: Adherence to prescribed nutrition plan.             Nutrition Assessments:  Nutrition Assessments - 09/06/23 1513       Rate Your Plate Scores   Post Score 42            MEDIFICTS Score Key: >=70 Need to make dietary changes  40-70 Heart Healthy Diet <= 40 Therapeutic Level Cholesterol Diet  Flowsheet Row PULMONARY REHAB OTHER RESPIRATORY from 09/06/2023  in [facility]  Picture Your Plate Total Score on Discharge 42      Picture Your Plate Scores: <66 Unhealthy dietary pattern with much room for improvement. 41-50 Dietary pattern unlikely to meet recommendations for good health and room for improvement. 51-60 More healthful dietary pattern, with some room for improvement.  >60 Healthy dietary pattern, although there may be some specific behaviors that could be improved.    Nutrition Goals Re-Evaluation:  Nutrition Goals Re-Evaluation     Row Name 06/28/23 1139 07/26/23 1128 08/23/23 1159         Goals   Current Weight 134 lb 4.2 oz (60.9 kg) 133 lb 2.5 oz (60.4 kg) 131 lb 6.3 oz (59.6 kg)     Comment A1c 6.1, Lipids WNL no new labs; most recent labs A1c 6.1, Lipids WNL no new labs; most recent labs A1c 6.1, Lipids WNL     Expected Outcome Shaquira reports that she "does not eat well". She reports snacking throughout the day and eating 1-2 meals daily; she drinks hot tea with sugar and beer/ wine regularly. She often chooses frozen meals such as Stouffer's,etc. We did discuss benefits of reducing sugar intake and meeting calorie/nutrition needs for pulmonary disease. She does have good knowledge of low sodium diet recommendations. She remains pre-contemplative toward dietary changes at this time. Ermine will continue to benefit from participation in pulmonary rehab for nutrition and exercise support. Megan [name] remains precontemplative toward significant nutrition changes. She has met goals for sodium intake. Megan [name] reports that she "does not eat well". She reports snacking throughout the day and eating 1-2 meals daily and over eating refined sugar foods; she drinks hot tea with sugar and beer/ wine regularly. She often chooses frozen meals such as Stouffer's,etc. Amarissa continues to monitor sodium intake and weigh regularly.Megan [name] has maintained her weight since starting with our program; BMI  remains appropriate for age. Megan [name] will continue to benefit from participation in pulmonary rehab for nutrition and exercise support. Megan [name] remains precontemplative toward significant nutrition changes. She has met goals for sodium intake. Megan [name] reports that she "does not eat well". She reports snacking throughout the day and eating 1-2 meals daily and over eating refined sugar foods; she drinks hot  tea with sugar and beer/ wine regularly. She often chooses frozen meals such as Stouffer's,etc. Pota continues to monitor sodium intake and weigh regularly.Megan [name] is down 2.2# since starting with our program; BMI remains appropriate for age. Takeira will continue to benefit from participation in pulmonary rehab for nutrition and exercise support.              Nutrition Goals Discharge (Final Nutrition Goals Re-Evaluation):  Nutrition Goals Re-Evaluation - 08/23/23 1159       Goals   Current Weight 131 lb 6.3 oz (59.6 kg)    Comment no new labs; most recent labs A1c 6.1, Lipids WNL    Expected Outcome Pinkie remains precontemplative toward significant nutrition changes. She has met goals for sodium intake. Erabella reports that she "does not eat well". She reports snacking throughout the day and eating 1-2 meals daily and over eating refined sugar foods; she drinks hot tea with sugar and beer/ wine regularly. She often chooses frozen meals such as Stouffer's,etc. Jenniah continues to monitor sodium intake and weigh regularly.Elyna is down 2.2# since starting with our program; BMI remains appropriate for age. Ercie will continue to benefit from participation in pulmonary rehab for nutrition and exercise support.             Psychosocial: Target Goals: Acknowledge presence or absence of significant depression and/or stress, maximize coping skills, provide positive support system. Participant is able to verbalize types and ability to use techniques and skills needed for reducing stress and  depression.  Initial Review & Psychosocial Screening:  Initial Psych Review & Screening - 06/22/23 1120       Initial Review   Current issues with Current Stress Concerns;Current Anxiety/Panic    Source of Stress Concerns Chronic Illness;Family;Unable to participate in former interests or hobbies    Comments Pt identifies that she is dealing with alot of stress. Her husband's declining health as well as her declining health bring's her alot of stress and axiety.      Family Dynamics   Good Support System? Yes    Comments Pt has friends that provide her alot of support.      Barriers   Psychosocial barriers to participate in program The patient should benefit from training in stress management and relaxation.      Screening Interventions   Interventions Encouraged to exercise;To provide support and resources with identified psychosocial needs    Expected Outcomes Short Term goal: Utilizing psychosocial counselor, staff and physician to assist with identification of specific Stressors or current issues interfering with healing process. Setting desired goal for each stressor or current issue identified.;Long Term Goal: Stressors or current issues are controlled or eliminated.;Short Term goal: Identification and review with participant of any Quality of Life or Depression concerns found by scoring the questionnaire.;Long Term goal: The participant improves quality of Life and PHQ9 Scores as seen by post scores and/or verbalization of changes             Quality of Life Scores:  Scores of 19 and below usually indicate a poorer quality of life in these areas.  A difference of  2-3 points is a clinically meaningful difference.  A difference of 2-3 points in the total score of the Quality of Life Index has been associated with significant improvement in overall quality of life, self-image, physical symptoms, and general health in studies assessing change in quality of life.  PHQ-9: Review  Flowsheet  More data exists      09/06/2023 06/22/2023  06/15/2023 04/28/2023 12/22/2022  Depression screen PHQ 2/9  Decreased Interest 0 0 0 0 0  Down, Depressed, Hopeless 0 1 0 0 0  PHQ - 2 Score 0 1 0 0 0  Altered sleeping 2 1 - - -  Tired, decreased energy 1 3 - - -  Change in appetite 1 0 - - -  Feeling bad or failure about yourself  0 0 - - -  Trouble concentrating 0 1 - - -  Moving slowly or fidgety/restless 0 0 - - -  Suicidal thoughts 0 0 - - -  PHQ-9 Score 4 6 - - -  Difficult doing work/chores Not difficult at all Not difficult at all - - -    Details           Interpretation of Total Score  Total Score Depression Severity:  1-4 = Minimal depression, 5-9 = Mild depression, 10-14 = Moderate depression, 15-19 = Moderately severe depression, 20-27 = Severe depression   Psychosocial Evaluation and Intervention:  Psychosocial Evaluation - 06/22/23 1122       Psychosocial Evaluation & Interventions   Interventions Stress management education;Encouraged to exercise with the program and follow exercise prescription    Comments Chanchal identifies that she is dealing with alot of stress currently. She has had to place her husband in a nursing home. She is also worried about her declining health. She has worked with a Paramedic in the past, but therapist told her she didn't need therapy. She declines medications and therapy at this time.    Expected Outcomes For Fayetta to participate in PR free of any psychosocial barriers or concers.    Continue Psychosocial Services  Follow up required by staff             Psychosocial Re-Evaluation:  Psychosocial Re-Evaluation     Row Name 07/06/23 1549 08/01/23 1355 08/29/23 1534         Psychosocial Re-Evaluation   Current issues with Current Stress Concerns;Current Anxiety/Panic Current Stress Concerns None Identified     Comments Cionna denies any new psychosocial barriers or concerns at this time. She is handling her stressors  well at this time. Staff will educate Aneeka on healthy ways to deal with stress. She still denies a referral for a therapist or medications at this time. Sheritta denies any new psychosocial barriers or concerns at this time. She still has some stress from time to time with her own declining health as well as her husband's declining health. She states exercising has helped alot with her stressors. She still denies a referral for a therapist or medications at this time. Densie denies any new psychosocial barriers or concerns at this time. She states her stress is under control at this time.     Expected Outcomes For Idamae to participate in PR free of any psychosocial barriers or concerns. For Kalesha to participate in PR free of any psychosocial barriers or concerns. For Kylin to participate in PR free of any psychosocial barriers or concerns.     Interventions Stress management education;Encouraged to attend Pulmonary Rehabilitation for the exercise Encouraged to attend Pulmonary Rehabilitation for the exercise Encouraged to attend Pulmonary Rehabilitation for the exercise     Continue Psychosocial Services  Follow up required by staff Follow up required by staff No Follow up required              Psychosocial Discharge (Final Psychosocial Re-Evaluation):  Psychosocial Re-Evaluation - 08/29/23 1534  Psychosocial Re-Evaluation   Current issues with None Identified    Comments Maya denies any new psychosocial barriers or concerns at this time. She states her stress is under control at this time.    Expected Outcomes For Areonna to participate in PR free of any psychosocial barriers or concerns.    Interventions Encouraged to attend Pulmonary Rehabilitation for the exercise    Continue Psychosocial Services  No Follow up required             Education: Education Goals: Education classes will be provided on a weekly basis, covering required topics. Participant will state  understanding/return demonstration of topics presented.  Learning Barriers/Preferences:  Learning Barriers/Preferences - 06/22/23 1123       Learning Barriers/Preferences   Learning Barriers Sight;Hearing    Learning Preferences Group Instruction             Education Topics: Know Your Numbers Group instruction that is supported by a PowerPoint presentation. Instructor discusses importance of knowing and understanding resting, exercise, and post-exercise oxygen saturation, heart rate, and blood pressure. Oxygen saturation, heart rate, blood pressure, rating of perceived exertion, and dyspnea are reviewed along with a normal range for these values.    Exercise for the Pulmonary Patient Group instruction that is supported by a PowerPoint presentation. Instructor discusses benefits of exercise, core components of exercise, frequency, duration, and intensity of an exercise routine, importance of utilizing pulse oximetry during exercise, safety while exercising, and options of places to exercise outside of rehab.  Flowsheet Row PULMONARY REHAB OTHER RESPIRATORY from 09/01/2023 in [facility]  Date 09/01/23  Educator EP  Instruction Review Code 1- Verbalizes Understanding       MET Level  Group instruction provided by PowerPoint, verbal discussion, and written material to support subject matter. Instructor reviews what METs are and how to increase METs.  Flowsheet Row PULMONARY REHAB OTHER RESPIRATORY from 08/04/2023 in [facility]  Date 08/04/23  Educator EP  Instruction Review Code 1- Verbalizes Understanding       Pulmonary Medications Verbally interactive group education provided by instructor with focus on inhaled medications and proper administration. Flowsheet Row PULMONARY REHAB OTHER RESPIRATORY from 08/25/2023 in [facility] for  Heart, Vascular, & Lung Health  Date 08/25/23  Educator RT  Instruction Review Code 1- Verbalizes Understanding       Anatomy and Physiology of the Respiratory System Group instruction provided by PowerPoint, verbal discussion, and written material to support subject matter. Instructor reviews respiratory cycle and anatomical components of the respiratory system and their functions. Instructor also reviews differences in obstructive and restrictive respiratory diseases with examples of each.  Flowsheet Row PULMONARY REHAB OTHER RESPIRATORY from 08/18/2023 in [facility] for Heart, Vascular, & Lung Health  Date 08/18/23  Educator RT  Instruction Review Code 1- Verbalizes Understanding       Oxygen Safety Group instruction provided by PowerPoint, verbal discussion, and written material to support subject matter. There is an overview of "What is Oxygen" and "Why do we need it".  Instructor also reviews how to create a safe environment for oxygen use, the importance of using oxygen as prescribed, and the risks of noncompliance. There is a brief discussion on traveling with oxygen and resources the patient may utilize.   Oxygen Use Group instruction provided by PowerPoint, verbal discussion, and written material to discuss how supplemental  oxygen is prescribed and different types of oxygen supply systems. Resources for more information are provided.  Flowsheet Row PULMONARY REHAB OTHER RESPIRATORY from 06/30/2023 in [facility] for Heart, Vascular, & Lung Health  Date 06/30/23  Educator RT  Instruction Review Code 1- Verbalizes Understanding       Breathing Techniques Group instruction that is supported by demonstration and informational handouts. Instructor discusses the benefits of pursed lip and diaphragmatic breathing and detailed demonstration on how to perform both.  Flowsheet Row PULMONARY REHAB OTHER RESPIRATORY from 07/07/2023 in [facility] for Heart, Vascular, & Lung Health  Date 07/07/23  Educator RN  Instruction Review Code 1- Verbalizes Understanding        Risk Factor Reduction Group instruction that is supported by a PowerPoint presentation. Instructor discusses the definition of a risk factor, different risk factors for pulmonary disease, and how the heart and lungs work together. Flowsheet Row PULMONARY REHAB OTHER RESPIRATORY from 07/28/2023 in [facility] for Heart, Vascular, & Lung Health  Date 07/28/23  Educator Ep  Instruction Review Code 1- Verbalizes Understanding       Pulmonary Diseases Group instruction provided by PowerPoint, verbal discussion, and written material to support subject matter. Instructor gives an overview of the different type of pulmonary diseases. There is also a discussion on risk factors and symptoms as well as ways to manage the diseases.   Stress and Energy Conservation Group instruction provided by PowerPoint, verbal discussion, and written material to support subject matter. Instructor gives an overview of stress and the impact it can have on the body. Instructor also reviews ways to reduce stress. There is also a discussion on energy conservation and ways to conserve energy throughout the day.   Warning Signs and Symptoms Group instruction provided by PowerPoint, verbal discussion, and written material to support subject matter. Instructor reviews warning signs and symptoms of stroke, heart attack, cold and flu. Instructor also reviews ways to prevent the spread of infection.   Other Education Group or individual verbal, written, or video instructions that support the educational goals of the pulmonary rehab program. Flowsheet Row PULMONARY REHAB OTHER RESPIRATORY from 08/11/2023 in [facility] for Heart, Vascular, & Lung Health  Date 08/11/23  Educator RT  Instruction Review Code 1- Verbalizes  Understanding        Knowledge Questionnaire Score:  Knowledge Questionnaire Score - 09/06/23 1527       Knowledge Questionnaire Score   Post Score 17/18             Core Components/Risk Factors/Patient Goals at Admission:  Personal Goals and Risk Factors at Admission - 06/22/23 1123       Core Components/Risk Factors/Patient Goals on Admission    Weight Management Yes;Weight Loss    Intervention Weight Management: Develop a combined nutrition and exercise program designed to reach desired caloric intake, while maintaining appropriate intake of nutrient and fiber, sodium and fats, and appropriate energy expenditure required for the weight goal.;Weight Management: Provide education and appropriate resources to help participant work on and attain dietary goals.;Weight Management/Obesity: Establish reasonable short term and long term weight goals.;Obesity: Provide education and appropriate resources to help participant work on and attain dietary goals.    Expected Outcomes Weight Loss: Understanding of general recommendations for a balanced deficit meal plan, which promotes 1-2 lb weight loss per week and includes a negative energy balance of [phone] kcal/d;Understanding recommendations for meals to include 15-35%  energy as protein, 25-35% energy from fat, 35-60% energy from carbohydrates, less than 200mg  of dietary cholesterol, 20-35 gm of total fiber daily;Understanding of distribution of calorie intake throughout the day with the consumption of 4-5 meals/snacks    Improve shortness of breath with ADL's Yes    Intervention Provide education, individualized exercise plan and daily activity instruction to help decrease symptoms of SOB with activities of daily living.    Expected Outcomes Short Term: Improve cardiorespiratory fitness to achieve a reduction of symptoms when performing ADLs;Long Term: Be able to perform more ADLs without symptoms or delay the onset of symptoms    Increase  knowledge of respiratory medications and ability to use respiratory devices properly  Yes    Intervention Provide education and demonstration as needed of appropriate use of medications, inhalers, and oxygen therapy.    Expected Outcomes Short Term: Achieves understanding of medications use. Understands that oxygen is a medication prescribed by physician. Demonstrates appropriate use of inhaler and oxygen therapy.;Long Term: Maintain appropriate use of medications, inhalers, and oxygen therapy.    Hypertension Yes    Intervention Provide education on lifestyle modifcations including regular physical activity/exercise, weight management, moderate sodium restriction and increased consumption of fresh fruit, vegetables, and low fat dairy, alcohol moderation, and smoking cessation.;Monitor prescription use compliance.    Expected Outcomes Short Term: Continued assessment and intervention until BP is < 140/62mm HG in hypertensive participants. < 130/17mm HG in hypertensive participants with diabetes, heart failure or chronic kidney disease.;Long Term: Maintenance of blood pressure at goal levels.    Stress Yes    Intervention Offer individual and/or small group education and counseling on adjustment to heart disease, stress management and health-related lifestyle change. Teach and support self-help strategies.;Refer participants experiencing significant psychosocial distress to appropriate mental health specialists for further evaluation and treatment. When possible, include family members and significant others in education/counseling sessions.    Expected Outcomes Short Term: Participant demonstrates changes in health-related behavior, relaxation and other stress management skills, ability to obtain effective social support, and compliance with psychotropic medications if prescribed.;Long Term: Emotional wellbeing is indicated by absence of clinically significant psychosocial distress or social isolation.              Core Components/Risk Factors/Patient Goals Review:   Goals and Risk Factor Review     Row Name 07/06/23 1555 08/01/23 1359 08/29/23 1538         Core Components/Risk Factors/Patient Goals Review   Personal Goals Review Weight Management/Obesity;Improve shortness of breath with ADL's;Develop more efficient breathing techniques such as purse lipped breathing and diaphragmatic breathing and practicing self-pacing with activity.;Increase knowledge of respiratory medications and ability to use respiratory devices properly.;Hypertension;Stress Weight Management/Obesity;Improve shortness of breath with ADL's;Hypertension Weight Management/Obesity;Improve shortness of breath with ADL's     Review Sharlene Dory has only attended 3 classes so far. Goal for weight loss is progressing. Stephannie is working with staff dietician to achieve her weight loss goals. Goal progressing on improving her shortness of breath with ADLs. Goal progressing on developing more efficient breathing techniques such as purse lipped breathing and diaphragmatic breathing; and practicing self-pacing with activity. Goal progressing on managing stress. Goal progressing on increase knowledge of respiratory medications and ability to use respiratory devices properly. Goal progressing on hypertension. Elveria states her B/P is up and down.  Staff has given Donna a blood pressure log to record her B/P's at home. She will follow up with her PCP. We will continue to monitor Tonji's progress. Goal for  weight loss is progressing. Tajanae is working with staff dietician to achieve her weight loss goals. Goal progressing on improving her shortness of breath with ADLs. Goal met on developing more efficient breathing techniques such as purse lipped breathing and diaphragmatic breathing; and practicing self-pacing with activity. Cornelius demonstrates purse lip breathing when she gets short of breath.  Goal met on managing stress. Goal met on increase  knowledge of respiratory medications and ability to use respiratory devices properly. Shari is able to demonstrate proper MDI use to the respiratory therapist. Goal progressing on hypertension. Rishika states her B/P is up and down.  Staff has given Tehya a blood pressure log to record her B/P's at home. She will follow up with her PCP. We will continue to monitor Nieve's progress. Goal for weight loss is progressing. Chellsie is working with staff dietician to achieve her weight loss goals. Goal progressing on improving her shortness of breath with ADLs. Goal met on hypertension. Kannon's blood pressures have been stable in class.  We will continue to monitor Holle's progress.     Expected Outcomes See admission goals See admission goals See admission goals              Core Components/Risk Factors/Patient Goals at Discharge (Final Review):   Goals and Risk Factor Review - 08/29/23 1538       Core Components/Risk Factors/Patient Goals Review   Personal Goals Review Weight Management/Obesity;Improve shortness of breath with ADL's    Review Goal for weight loss is progressing. Sammie is working with staff dietician to achieve her weight loss goals. Goal progressing on improving her shortness of breath with ADLs. Goal met on hypertension. Myia's blood pressures have been stable in class.  We will continue to monitor Talma's progress.    Expected Outcomes See admission goals             ITP Comments: Pt is making expected progress toward Pulmonary Rehab goals after completing 21 sessions. Recommend continued exercise, life style modification, education, and utilization of breathing techniques to increase stamina and strength, while also decreasing shortness of breath with exertion.  Dr. Mechele Collin is Medical Director for Pulmonary Rehab at [facility].

## 2023-09-08 ENCOUNTER — Encounter (HOSPITAL_COMMUNITY)
Admission: RE | Admit: 2023-09-08 | Discharge: 2023-09-08 | Disposition: A | Discharge status: Home / Self Care | Payer: Medicare Other | Source: Ambulatory Visit | Attending: Pulmonary Disease | Admitting: Pulmonary Disease

## 2023-09-08 DIAGNOSIS — J439 Emphysema, unspecified: Secondary | ICD-10-CM | POA: Diagnosis not present

## 2023-09-08 NOTE — Progress Notes (Signed)
Daily Session Note  Patient Details  Name: Megan [name] MRN: [id number] Date of Birth: [birth year] Referring Provider:   Doristine Devoid Pulmonary Rehab Walk Test from 06/22/2023 in [facility] for Heart, Vascular, & Lung Health  Referring Provider Hunsucker       Encounter Date: 09/08/2023  Check In:  Session Check In - 09/08/23 1137       Check-In   Supervising physician immediately available to respond to emergencies [facility] MD immediately available    Physician(s) Joni Reining, NP    Location [facility]    Staff Present Raford Pitcher, MS, ACSM-CEP, Exercise Physiologist;Casey Erin Sons BS, ACSM-CEP, Exercise Physiologist;Mary Gerre Scull, RN, BSN    Virtual Visit No    Medication changes reported     No    Fall or balance concerns reported    No    Tobacco Cessation No Change    Warm-up and Cool-down Performed as group-led instruction    Resistance Training Performed Yes    VAD Patient? No    PAD/SET Patient? No      Pain Assessment   Currently in Pain? No/denies    Pain Score 0-No pain    Multiple Pain Sites No             Capillary Blood Glucose: No results found. However, due to the size of the patient record, not all encounters were searched. Please check Results Review for a complete set of results.    Social History   Tobacco Use  Smoking Status Former   Current packs/day: 0.00   Types: Cigarettes   Quit date: 09/05/1996   Years since quitting: 27.0   Passive exposure: Never  Smokeless Tobacco Never    Goals Met:  Proper associated with RPD/PD & O2 Sat Independence with exercise equipment Exercise tolerated well No report of concerns or symptoms today Strength training completed today  Goals Unmet:  Not Applicable  Comments: Service time is from 1016 to 1159.    Dr. Mechele Collin is Medical Director for Pulmonary Rehab at [facility].

## 2023-09-12 ENCOUNTER — Other Ambulatory Visit: Payer: Self-pay

## 2023-09-12 ENCOUNTER — Inpatient Hospital Stay
Admission: RE | Admit: 2023-09-12 | Discharge: 2023-09-12 | Disposition: A | Discharge status: Home / Self Care | Payer: Self-pay | Source: Ambulatory Visit | Attending: Pulmonary Disease | Admitting: Pulmonary Disease

## 2023-09-12 DIAGNOSIS — R042 Hemoptysis: Secondary | ICD-10-CM

## 2023-09-13 ENCOUNTER — Encounter (HOSPITAL_COMMUNITY)
Admission: RE | Admit: 2023-09-13 | Discharge: 2023-09-13 | Disposition: A | Discharge status: Home / Self Care | Payer: Medicare Other | Source: Ambulatory Visit | Attending: Pulmonary Disease | Admitting: Pulmonary Disease

## 2023-09-13 DIAGNOSIS — J439 Emphysema, unspecified: Secondary | ICD-10-CM

## 2023-09-13 NOTE — Progress Notes (Signed)
Daily Session Note  Patient Details  Name: Megan [name] MRN: [id number] Date of Birth: [birth year] Referring Provider:   Doristine Devoid Pulmonary Rehab Walk Test from 06/22/2023 in [facility] for Heart, Vascular, & Lung Health  Referring Provider Hunsucker       Encounter Date: 09/13/2023  Check In:  Session Check In - 09/13/23 1106       Check-In   Supervising physician immediately available to respond to emergencies [facility] MD immediately available    Physician(s) Joni Reining, NP    Location [facility]    Staff Present Raford Pitcher, MS, ACSM-CEP, Exercise Physiologist;Casey Erin Sons BS, ACSM-CEP, Exercise Physiologist    Virtual Visit No    Medication changes reported     No    Fall or balance concerns reported    No    Tobacco Cessation No Change    Warm-up and Cool-down Performed as group-led instruction    Resistance Training Performed Yes    VAD Patient? No    PAD/SET Patient? No      Pain Assessment   Currently in Pain? No/denies    Pain Score 0-No pain    Multiple Pain Sites No             Capillary Blood Glucose: No results found. However, due to the size of the patient record, not all encounters were searched. Please check Results Review for a complete set of results.    Social History   Tobacco Use  Smoking Status Former   Current packs/day: 0.00   Types: Cigarettes   Quit date: 09/05/1996   Years since quitting: 27.0   Passive exposure: Never  Smokeless Tobacco Never    Goals Met:  Proper associated with RPD/PD & O2 Sat Exercise tolerated well No report of concerns or symptoms today Strength training completed today  Goals Unmet:  Not Applicable  Comments: Service time is from 1009 to 1141  .    Dr. Mechele Collin is Medical Director for Pulmonary Rehab at [facility].

## 2023-09-15 ENCOUNTER — Encounter (HOSPITAL_COMMUNITY)
Admission: RE | Admit: 2023-09-15 | Discharge: 2023-09-15 | Disposition: A | Discharge status: Home / Self Care | Payer: Medicare Other | Source: Ambulatory Visit | Attending: Pulmonary Disease | Admitting: Pulmonary Disease

## 2023-09-15 DIAGNOSIS — J439 Emphysema, unspecified: Secondary | ICD-10-CM | POA: Diagnosis not present

## 2023-09-15 NOTE — Progress Notes (Signed)
Daily Session Note  Patient Details  Name: Megan [name] MRN: [id number] Date of Birth: [birth year] Referring Provider:   Doristine Devoid Pulmonary Rehab Walk Test from 06/22/2023 in [facility] for Heart, Vascular, & Lung Health  Referring Provider Hunsucker       Encounter Date: 09/15/2023  Check In:  Session Check In - 09/15/23 1159       Check-In   Supervising physician immediately available to respond to emergencies [facility] MD immediately available    Physician(s) Jari Favre, NP    Location [facility]    Staff Present Raford Pitcher, MS, ACSM-CEP, Exercise Physiologist;Challis Crill Erin Sons BS, ACSM-CEP, Exercise Physiologist;Mary Gerre Scull, RN, BSN    Virtual Visit No    Medication changes reported     No    Fall or balance concerns reported    No    Tobacco Cessation No Change    Warm-up and Cool-down Performed as group-led instruction    Resistance Training Performed Yes    VAD Patient? No    PAD/SET Patient? No      Pain Assessment   Currently in Pain? No/denies    Pain Score 0-No pain    Multiple Pain Sites No             Capillary Blood Glucose: No results found. However, due to the size of the patient record, not all encounters were searched. Please check Results Review for a complete set of results.    Social History   Tobacco Use  Smoking Status Former   Current packs/day: 0.00   Types: Cigarettes   Quit date: 09/05/1996   Years since quitting: 27.0   Passive exposure: Never  Smokeless Tobacco Never    Goals Met:  Proper associated with RPD/PD & O2 Sat Independence with exercise equipment Exercise tolerated well No report of concerns or symptoms today Strength training completed today  Goals Unmet:  Not Applicable  Comments: Service time is from 0945 to 1140.    Dr. Mechele Collin is Medical Director for Pulmonary Rehab at [facility].

## 2023-09-16 ENCOUNTER — Telehealth: Payer: Self-pay | Admitting: Gastroenterology

## 2023-09-16 ENCOUNTER — Other Ambulatory Visit: Payer: Self-pay

## 2023-09-16 ENCOUNTER — Encounter: Payer: Self-pay | Admitting: Cardiology

## 2023-09-16 ENCOUNTER — Other Ambulatory Visit: Payer: Self-pay | Admitting: Gastroenterology

## 2023-09-16 DIAGNOSIS — R0902 Hypoxemia: Secondary | ICD-10-CM | POA: Diagnosis not present

## 2023-09-16 DIAGNOSIS — G4730 Sleep apnea, unspecified: Secondary | ICD-10-CM | POA: Diagnosis not present

## 2023-09-16 MED ORDER — OMEPRAZOLE 40 MG PO CPDR: 40.0000 mg | DELAYED_RELEASE_CAPSULE | Freq: Two times a day (BID) | ORAL | 0 refills | Status: DC

## 2023-09-16 NOTE — Telephone Encounter (Signed)
Inbound call from patient requesting refill for Prilosec. Please advise.

## 2023-09-16 NOTE — Telephone Encounter (Signed)
Informed patient she is due for follow up visit with Dr. Russella Dar. Patient scheduled appt for 11/15/23 at 10:30am. Prescription sent to patient's pharmacy until scheduled appt.

## 2023-09-16 NOTE — Progress Notes (Signed)
Discharge Progress Report  Patient Details  Name: Megan [name] MRN: [id number] Date of Birth: 07-13-1946 Referring Provider:   Doristine Devoid [facility] Rehab Walk Test from 06/22/2023 in [facility]  Referring Provider Hunsucker        Number of Visits: 24  Reason for Discharge:  Patient has met program and personal goals.  Smoking History:  Social History   Tobacco Use  Smoking Status Former   Current packs/day: 0.00   Types: Cigarettes   Quit date: 09/05/1996   Years since quitting: 27.0   Passive exposure: Never  Smokeless Tobacco Never    Diagnosis:  Pulmonary emphysema, unspecified emphysema type (HCC)  ADL UCSD:  Pulmonary Assessment Scores     Row Name 06/22/23 1128 09/06/23 1529       ADL UCSD   ADL Phase Entry Exit    SOB Score total 74 34      CAT Score   CAT Score 31 27      mMRC Score   mMRC Score 2 --             Initial Exercise Prescription:  Initial Exercise Prescription - 06/22/23 1200       Date of Initial Exercise RX and Referring Provider   Date 06/22/23    Referring Provider Hunsucker    Expected Discharge Date 09/15/23      Oxygen   Oxygen Continuous    Liters 1    Maintain Oxygen Saturation 88% or higher      Treadmill   MPH 2    Grade 0    Minutes 15      NuStep   Level 2    SPM 60    Minutes 15    METs 2.5      Prescription Details   Frequency (times per week) 2    Duration Progress to 30 minutes of continuous aerobic without signs/symptoms of physical distress      Intensity   THRR 40-80% of Max Heartrate 57-114    Ratings of Perceived Exertion 11-13    Perceived Dyspnea 0-4      Progression   Progression Continue to progress workloads to maintain intensity without signs/symptoms of physical distress.      Resistance Training   Training Prescription Yes    Weight red bands    Reps 10-15             Discharge Exercise Prescription  (Final Exercise Prescription Changes):  Exercise Prescription Changes - 09/06/23 1200       Response to Exercise   Blood Pressure (Admit) 118/60    Blood Pressure (Exercise) 148/50    Blood Pressure (Exit) 98/52    Heart Rate (Admit) 76 bpm    Heart Rate (Exercise) 91 bpm    Heart Rate (Exit) 77 bpm    Oxygen Saturation (Admit) 91 %    Oxygen Saturation (Exercise) 90 %    Oxygen Saturation (Exit) 92 %    Rating of Perceived Exertion (Exercise) 11    Perceived Dyspnea (Exercise) 1    Duration Continue with 30 min of aerobic exercise without signs/symptoms of physical distress.    Intensity THRR unchanged      Progression   Progression Continue to progress workloads to maintain intensity without signs/symptoms of physical distress.      Resistance Training   Training Prescription Yes    Weight blue bands    Reps 10-15  Time 10 Minutes      Oxygen   Oxygen Continuous    Liters 2      Treadmill   MPH 2.8    Grade 1.5    Minutes 15    METs 3.4      NuStep   Level 5    SPM 78    Minutes 15    METs 3.4      Oxygen   Maintain Oxygen Saturation 88% or higher             Functional Capacity:  6 Minute Walk     Row Name 06/22/23 1029 09/15/23 1516       6 Minute Walk   Phase Initial Discharge    Distance 780 feet 1258 feet    Distance % Change -- 61.28 %    Distance Feet Change -- 478 ft    Walk Time 6 minutes 6 minutes    # of Rest Breaks 0 0    MPH 1.48 2.38    METS 1.32 2.28    RPE 6.5 11    Perceived Dyspnea  0 1    VO2 Peak 4.61 7.99    Symptoms No No    Resting HR 69 bpm 80 bpm    Resting BP 102/52 114/52    Resting Oxygen Saturation  91 % 96 %    Exercise Oxygen Saturation  during 6 min walk 86 % 90 %    Max Ex. HR 82 bpm 98 bpm    Max Ex. BP 138/66 132/50    2 Minute Post BP 120/54 112/54      Interval HR   1 Minute HR 77 84    2 Minute HR 80 89    3 Minute HR 82 88    4 Minute HR 79 94    5 Minute HR 79 96    6 Minute HR 80 98     2 Minute Post HR 71 81    Interval Heart Rate? Yes Yes      Interval Oxygen   Interval Oxygen? Yes Yes    Baseline Oxygen Saturation % 91 % 96 %    1 Minute Oxygen Saturation % 93 % 93 %    1 Minute Liters of Oxygen 0 L 1 L    2 Minute Oxygen Saturation % 88 % 90 %    2 Minute Liters of Oxygen 0 L 1 L    3 Minute Oxygen Saturation % 86 %  2:46 92 %    3 Minute Liters of Oxygen 0 L  increased to 1L 1 L    4 Minute Oxygen Saturation % 90 % 92 %    4 Minute Liters of Oxygen 1 L 1 L    5 Minute Oxygen Saturation % 89 % 90 %    5 Minute Liters of Oxygen 1 L 1 L    6 Minute Oxygen Saturation % 91 % 90 %    6 Minute Liters of Oxygen 1 L 1 L    2 Minute Post Oxygen Saturation % 98 % 94 %    2 Minute Post Liters of Oxygen 1 L 1 L             Psychological, QOL, Others - Outcomes: PHQ 2/9:    09/06/2023    3:24 PM 06/22/2023   11:40 AM 06/15/2023    3:00 PM 04/28/2023    1:32 PM 12/22/2022   10:40 AM  Depression  screen PHQ 2/9  Decreased Interest 0 0 0 0 0  Down, Depressed, Hopeless 0 1 0 0 0  PHQ - 2 Score 0 1 0 0 0  Altered sleeping 2 1     Tired, decreased energy 1 3     Change in appetite 1 0     Feeling bad or failure about yourself  0 0     Trouble concentrating 0 1     Moving slowly or fidgety/restless 0 0     Suicidal thoughts 0 0     PHQ-9 Score 4 6     Difficult doing work/chores Not difficult at all Not difficult at all       Quality of Life:   Personal Goals: Goals established at orientation with interventions provided to work toward goal.  Personal Goals and Risk Factors at Admission - 06/22/23 1123       Core Components/Risk Factors/Patient Goals on Admission    Weight Management Yes;Weight Loss    Intervention Weight Management: Develop a combined nutrition and exercise program designed to reach desired caloric intake, while maintaining appropriate intake of nutrient and fiber, sodium and fats, and appropriate energy expenditure required for the weight  goal.;Weight Management: Provide education and appropriate resources to help participant work on and attain dietary goals.;Weight Management/Obesity: Establish reasonable short term and long term weight goals.;Obesity: Provide education and appropriate resources to help participant work on and attain dietary goals.    Expected Outcomes Weight Loss: Understanding of general recommendations for a balanced deficit meal plan, which promotes 1-2 lb weight loss per week and includes a negative energy balance of [phone] kcal/d;Understanding recommendations for meals to include 15-35% energy as protein, 25-35% energy from fat, 35-60% energy from carbohydrates, less than 200mg  of dietary cholesterol, 20-35 gm of total fiber daily;Understanding of distribution of calorie intake throughout the day with the consumption of 4-5 meals/snacks    Improve shortness of breath with ADL's Yes    Intervention Provide education, individualized exercise plan and daily activity instruction to help decrease symptoms of SOB with activities of daily living.    Expected Outcomes Short Term: Improve cardiorespiratory fitness to achieve a reduction of symptoms when performing ADLs;Long Term: Be able to perform more ADLs without symptoms or delay the onset of symptoms    Increase knowledge of respiratory medications and ability to use respiratory devices properly  Yes    Intervention Provide education and demonstration as needed of appropriate use of medications, inhalers, and oxygen therapy.    Expected Outcomes Short Term: Achieves understanding of medications use. Understands that oxygen is a medication prescribed by physician. Demonstrates appropriate use of inhaler and oxygen therapy.;Long Term: Maintain appropriate use of medications, inhalers, and oxygen therapy.    Hypertension Yes    Intervention Provide education on lifestyle modifcations including regular physical activity/exercise, weight management, moderate sodium  restriction and increased consumption of fresh fruit, vegetables, and low fat dairy, alcohol moderation, and smoking cessation.;Monitor prescription use compliance.    Expected Outcomes Short Term: Continued assessment and intervention until BP is < 140/64mm HG in hypertensive participants. < 130/25mm HG in hypertensive participants with diabetes, heart failure or chronic kidney disease.;Long Term: Maintenance of blood pressure at goal levels.    Stress Yes    Intervention Offer individual and/or small group education and counseling on adjustment to heart disease, stress management and health-related lifestyle change. Teach and support self-help strategies.;Refer participants experiencing significant psychosocial distress to appropriate mental health specialists for further evaluation and  treatment. When possible, include family members and significant others in education/counseling sessions.    Expected Outcomes Short Term: Participant demonstrates changes in health-related behavior, relaxation and other stress management skills, ability to obtain effective social support, and compliance with psychotropic medications if prescribed.;Long Term: Emotional wellbeing is indicated by absence of clinically significant psychosocial distress or social isolation.              Personal Goals Discharge:  Goals and Risk Factor Review     Row Name 07/06/23 1555 08/01/23 1359 08/29/23 1538 09/16/23 0857       Core Components/Risk Factors/Patient Goals Review   Personal Goals Review Weight Management/Obesity;Improve shortness of breath with ADL's;Develop more efficient breathing techniques such as purse lipped breathing and diaphragmatic breathing and practicing self-pacing with activity.;Increase knowledge of respiratory medications and ability to use respiratory devices properly.;Hypertension;Stress Weight Management/Obesity;Improve shortness of breath with ADL's;Hypertension Weight Management/Obesity;Improve  shortness of breath with ADL's --    Review Megan [name] has only attended 3 classes so far. Goal for weight loss is progressing. Megan [name] is working with staff dietician to achieve her weight loss goals. Goal progressing on improving her shortness of breath with ADLs. Goal progressing on developing more efficient breathing techniques such as purse lipped breathing and diaphragmatic breathing; and practicing self-pacing with activity. Goal progressing on managing stress. Goal progressing on increase knowledge of respiratory medications and ability to use respiratory devices properly. Goal progressing on hypertension. Megan [name] states her B/P is up and down.  Staff has given Megan [name] a blood pressure log to record her B/P's at home. She will follow up with her PCP. We will continue to monitor Megan [name]'s progress. Goal for weight loss is progressing. Megan [name] is working with staff dietician to achieve her weight loss goals. Goal progressing on improving her shortness of breath with ADLs. Goal met on developing more efficient breathing techniques such as purse lipped breathing and diaphragmatic breathing; and practicing self-pacing with activity. Megan [name] demonstrates purse lip breathing when she gets short of breath.  Goal met on managing stress. Goal met on increase knowledge of respiratory medications and ability to use respiratory devices properly. Megan [name] is able to demonstrate proper MDI use to the respiratory therapist. Goal progressing on hypertension. Megan [name] states her B/P is up and down.  Staff has given Megan [name] a blood pressure log to record her B/P's at home. She will follow up with her PCP. We will continue to monitor Megan [name]'s progress. Goal for weight loss is progressing. Megan [name] is working with staff dietician to achieve her weight loss goals. Goal progressing on improving her shortness of breath with ADLs. Goal met on hypertension. Megan [name]'s blood pressures have been stable in class.  We will continue to monitor Megan [name]'s  progress. Megan [name] graduated from the [employer] on 09/15/23. She did not meet her weight loss goals. Her weight remained the same throughout the program. Megan [name] did meet goal on improve SOB with ADLs. Her pre SOB score was 74 and her post was 34. Megan [name] was a pleasure to work with and we wish her the best.    Expected Outcomes See admission goals See admission goals See admission goals To continue to exercise and modify his nutrition and lifestyle post graduation             Exercise Goals and Review:  Exercise Goals     Row Name 06/22/23 1125 07/01/23 1044 08/03/23 1058         Exercise Goals   Increase Physical Activity Yes Yes Yes  Intervention Provide advice, education, support and counseling about physical activity/exercise needs.;Develop an individualized exercise prescription for aerobic and resistive training based on initial evaluation findings, risk stratification, comorbidities and participant's personal goals. Provide advice, education, support and counseling about physical activity/exercise needs.;Develop an individualized exercise prescription for aerobic and resistive training based on initial evaluation findings, risk stratification, comorbidities and participant's personal goals. Provide advice, education, support and counseling about physical activity/exercise needs.;Develop an individualized exercise prescription for aerobic and resistive training based on initial evaluation findings, risk stratification, comorbidities and participant's personal goals.     Expected Outcomes Short Term: Attend rehab on a regular basis to increase amount of physical activity.;Long Term: Exercising regularly at least 3-5 days a week.;Long Term: Add in home exercise to make exercise part of routine and to increase amount of physical activity. Short Term: Attend rehab on a regular basis to increase amount of physical activity.;Long Term: Exercising regularly at least 3-5 days a week.;Long Term: Add  in home exercise to make exercise part of routine and to increase amount of physical activity. Short Term: Attend rehab on a regular basis to increase amount of physical activity.;Long Term: Exercising regularly at least 3-5 days a week.;Long Term: Add in home exercise to make exercise part of routine and to increase amount of physical activity.     Increase Strength and Stamina Yes Yes Yes     Intervention Provide advice, education, support and counseling about physical activity/exercise needs.;Develop an individualized exercise prescription for aerobic and resistive training based on initial evaluation findings, risk stratification, comorbidities and participant's personal goals. Provide advice, education, support and counseling about physical activity/exercise needs.;Develop an individualized exercise prescription for aerobic and resistive training based on initial evaluation findings, risk stratification, comorbidities and participant's personal goals. Provide advice, education, support and counseling about physical activity/exercise needs.;Develop an individualized exercise prescription for aerobic and resistive training based on initial evaluation findings, risk stratification, comorbidities and participant's personal goals.     Expected Outcomes Short Term: Increase workloads from initial exercise prescription for resistance, speed, and METs.;Short Term: Perform resistance training exercises routinely during rehab and add in resistance training at home;Long Term: Improve cardiorespiratory fitness, muscular endurance and strength as measured by increased METs and functional capacity ( ) Short Term: Increase workloads from initial exercise prescription for resistance, speed, and METs.;Short Term: Perform resistance training exercises routinely during rehab and add in resistance training at home;Long Term: Improve cardiorespiratory fitness, muscular endurance and strength as measured by increased METs and  functional capacity ( ) Short Term: Increase workloads from initial exercise prescription for resistance, speed, and METs.;Short Term: Perform resistance training exercises routinely during rehab and add in resistance training at home;Long Term: Improve cardiorespiratory fitness, muscular endurance and strength as measured by increased METs and functional capacity ( )     Able to understand and use rate of perceived exertion (RPE) scale Yes Yes Yes     Intervention Provide education and explanation on how to use RPE scale Provide education and explanation on how to use RPE scale Provide education and explanation on how to use RPE scale     Expected Outcomes Short Term: Able to use RPE daily in rehab to express subjective intensity level;Long Term:  Able to use RPE to guide intensity level when exercising independently Short Term: Able to use RPE daily in rehab to express subjective intensity level;Long Term:  Able to use RPE to guide intensity level when exercising independently Short Term: Able to use RPE daily in rehab to express subjective  intensity level;Long Term:  Able to use RPE to guide intensity level when exercising independently     Able to understand and use Dyspnea scale Yes Yes Yes     Intervention Provide education and explanation on how to use Dyspnea scale Provide education and explanation on how to use Dyspnea scale Provide education and explanation on how to use Dyspnea scale     Expected Outcomes Short Term: Able to use Dyspnea scale daily in rehab to express subjective sense of shortness of breath during exertion;Long Term: Able to use Dyspnea scale to guide intensity level when exercising independently Short Term: Able to use Dyspnea scale daily in rehab to express subjective sense of shortness of breath during exertion;Long Term: Able to use Dyspnea scale to guide intensity level when exercising independently Short Term: Able to use Dyspnea scale daily in rehab to express  subjective sense of shortness of breath during exertion;Long Term: Able to use Dyspnea scale to guide intensity level when exercising independently     Knowledge and understanding of Target Heart Rate Range (THRR) Yes Yes Yes     Intervention Provide education and explanation of THRR including how the numbers were predicted and where they are located for reference Provide education and explanation of THRR including how the numbers were predicted and where they are located for reference Provide education and explanation of THRR including how the numbers were predicted and where they are located for reference     Expected Outcomes Short Term: Able to state/look up THRR;Long Term: Able to use THRR to govern intensity when exercising independently;Short Term: Able to use daily as guideline for intensity in rehab Short Term: Able to state/look up THRR;Long Term: Able to use THRR to govern intensity when exercising independently;Short Term: Able to use daily as guideline for intensity in rehab Short Term: Able to state/look up THRR;Long Term: Able to use THRR to govern intensity when exercising independently;Short Term: Able to use daily as guideline for intensity in rehab     Able to check pulse independently Yes Yes --     Understanding of Exercise Prescription Yes Yes Yes     Intervention Provide education, explanation, and written materials on patient's individual exercise prescription Provide education, explanation, and written materials on patient's individual exercise prescription Provide education, explanation, and written materials on patient's individual exercise prescription     Expected Outcomes Short Term: Able to explain program exercise prescription;Long Term: Able to explain home exercise prescription to exercise independently Short Term: Able to explain program exercise prescription;Long Term: Able to explain home exercise prescription to exercise independently Short Term: Able to explain program  exercise prescription;Long Term: Able to explain home exercise prescription to exercise independently              Exercise Goals Re-Evaluation:  Exercise Goals Re-Evaluation     Row Name 07/01/23 1044 08/03/23 1100 08/31/23 0850 09/15/23 1519       Exercise Goal Re-Evaluation   Exercise Goals Review Increase Physical Activity;Able to understand and use Dyspnea scale;Understanding of Exercise Prescription;Increase Strength and Stamina;Knowledge and understanding of Target Heart Rate Range (THRR);Able to understand and use rate of perceived exertion (RPE) scale Increase Physical Activity;Able to understand and use Dyspnea scale;Understanding of Exercise Prescription;Increase Strength and Stamina;Knowledge and understanding of Target Heart Rate Range (THRR);Able to understand and use rate of perceived exertion (RPE) scale Increase Physical Activity;Able to understand and use Dyspnea scale;Understanding of Exercise Prescription;Increase Strength and Stamina;Knowledge and understanding of Target Heart Rate Range (THRR);Able  to understand and use rate of perceived exertion (RPE) scale Increase Physical Activity;Able to understand and use Dyspnea scale;Understanding of Exercise Prescription;Increase Strength and Stamina;Knowledge and understanding of Target Heart Rate Range (THRR);Able to understand and use rate of perceived exertion (RPE) scale    Comments Megan [name] has completed 2 exercise sessions. She exercises for 15 min on the treadmill and Nustep. [name] averages 2.15 METs at 1.5 mph and 0 incline on the treadmill and 1.6 MET at level 2 on the Nustep. She performs the warmup and cooldown standing holding onto a chair for balance. It is too soon to note any discernable progressions. Will continue to monitor and progress as able. Megan [name] has completed 11 exercise sessions. She exercises for 15 min on the treadmill and Nustep. Megan [name] averages 3.44 METs at 2.7 mph and 1 incline on the treadmill and 2.6 MET  at level 4 on the Nustep. She performs the warmup and cooldown standing holding onto a chair for balance. She has increased her workload for both exercise modes. Her speed had increased from 2.5 to 2.7 mph and incline has increased for 0-1. Her level on the Nustep has increased from 2 to 4. Megan [name] has tolerated these increases well. Will continue to monitor and progress as able. Megan [name] has completed 11 exercise sessions. She exercises for 15 min on the treadmill and Nustep. Megan [name] averages 3.44 METs at 2.7 mph and 1 incline on the treadmill and 2.7 MET at level 4 on the Nustep. She performs the warmup and cooldown standing holding onto a chair for balance. She has not increased her workload for either exercise mode. Will encourage her to increase workloads next session. We have recently discussed home exercise as Megan [name] does exercise outside of rehab. I am confident in her carrying out a home exercise regimen. Will continue to monitor and progress as able. Deborh completed 24 exercise sessions. She exercised for 15 min on the treadmill and Nustep. Peak METs were 3.3 on the treadmill and the Nustep. She increased walking distance on ther 6 MWT. She will continue to exercise at home on her treadmill.    Expected Outcomes Through exercise at rehab and home, the patient will decrease shortness of breath with daily activities and feel confident in carrying out an exercise regimen at home. Through exercise at rehab and home, the patient will decrease shortness of breath with daily activities and feel confident in carrying out an exercise regimen at home. Through exercise at rehab and home, the patient will decrease shortness of breath with daily activities and feel confident in carrying out an exercise regimen at home. Through exercise at rehab and home, the patient will decrease shortness of breath with daily activities and feel confident in carrying out an exercise regimen at home.             Nutrition &  Weight - Outcomes:  Pre Biometrics - 06/22/23 1153       Pre Biometrics   Grip Strength 22 kg              Nutrition:  Nutrition Therapy & Goals - 08/23/23 1159       Nutrition Therapy   Diet Heart Healthy Diet    Drug/Food Interactions Statins/Certain Fruits      Personal Nutrition Goals   Nutrition Goal Patient to improve diet quality by using the plate method as a guide for meal planning to include lean protein/plant protein, fruits, vegetables, whole grains, nonfat dairy as part of a well-balanced diet.  goal not met.   Personal Goal #2 Patient to reduce sodium intake 2300mg  per day   goal in progress.   Comments Danaysia remains precontemplative toward significant nutrition changes. She has met goals for sodium intake. Asencion reports that she "does not eat well". She reports snacking throughout the day and eating 1-2 meals daily and over eating refined sugar foods; she drinks hot tea with sugar and beer/ wine regularly. She often chooses frozen meals such as [employer],etc. Abrionna continues to monitor sodium intake and weigh regularly.Aditi is down 2.2# since starting with our program; BMI remains appropriate for age. Kenan will continue to benefit from participation in pulmonary rehab for nutrition and exercise support.      Intervention Plan   Intervention Prescribe, educate and counsel regarding individualized specific dietary modifications aiming towards targeted core components such as weight, hypertension, lipid management, diabetes, heart failure and other comorbidities.;Nutrition handout(s) given to patient.    Expected Outcomes Short Term Goal: Understand basic principles of dietary content, such as calories, fat, sodium, cholesterol and nutrients.;Long Term Goal: Adherence to prescribed nutrition plan.             Nutrition Discharge:  Nutrition Assessments - 09/06/23 1513       Rate Your Plate Scores   Post Score 42             Education  Questionnaire Score:  Knowledge Questionnaire Score - 09/06/23 1527       Knowledge Questionnaire Score   Post Score 17/18             Goals reviewed with patient; copy given to patient.

## 2023-09-20 ENCOUNTER — Other Ambulatory Visit: Payer: Self-pay

## 2023-10-12 ENCOUNTER — Encounter: Payer: Self-pay | Admitting: Pulmonary Disease

## 2023-10-13 DIAGNOSIS — M797 Fibromyalgia: Secondary | ICD-10-CM | POA: Diagnosis not present

## 2023-10-13 DIAGNOSIS — M48061 Spinal stenosis, lumbar region without neurogenic claudication: Secondary | ICD-10-CM | POA: Diagnosis not present

## 2023-10-17 NOTE — Telephone Encounter (Signed)
PCCs - please advise on ONO results. Thanks!

## 2023-10-20 ENCOUNTER — Encounter: Payer: Self-pay | Admitting: Cardiology

## 2023-11-01 ENCOUNTER — Telehealth: Payer: Self-pay | Admitting: Pulmonary Disease

## 2023-11-01 MED ORDER — AMOXICILLIN-POT CLAVULANATE 875-125 MG PO TABS: 1.0000 | ORAL_TABLET | Freq: Two times a day (BID) | ORAL | 0 refills | Status: DC

## 2023-11-01 MED ORDER — PREDNISONE 20 MG PO TABS: ORAL_TABLET | ORAL | 0 refills | Status: AC

## 2023-11-01 MED ORDER — HYDROCODONE BIT-HOMATROP MBR 5-1.5 MG/5ML PO SOLN: 5.0000 mL | Freq: Four times a day (QID) | ORAL | 0 refills | Status: DC | PRN

## 2023-11-01 NOTE — Telephone Encounter (Signed)
Augmentin BID for 7 days and prednisone taper sent to [facility] on [address].[name]

## 2023-11-01 NOTE — Telephone Encounter (Signed)
Spoke with patient regarding prior message .Advised patient that Dr. [name]  Augmentin BID for 7 days and prednisone taper [facility] to [facility] on [address]..    Patient did ask to send in cough medication to her pharmacy.  Dr.Hunsuker can you please advise

## 2023-11-01 NOTE — Telephone Encounter (Signed)
Sent!

## 2023-11-01 NOTE — Telephone Encounter (Signed)
Spoke with patient regarding prior message. Patient stated she has been coughing ,wheezing light green mucus .0 fever and has taken some cough medication .   Dr.Hunsucker can you please advise. Thank you

## 2023-11-01 NOTE — Telephone Encounter (Signed)
Has been coughing all night long, she has been wheezing so severely, her left lung is aching when she breathe

## 2023-11-02 ENCOUNTER — Encounter: Payer: Self-pay | Admitting: Pulmonary Disease

## 2023-11-05 ENCOUNTER — Other Ambulatory Visit: Payer: Self-pay | Admitting: Gastroenterology

## 2023-11-07 ENCOUNTER — Encounter: Payer: Self-pay | Admitting: Internal Medicine

## 2023-11-07 ENCOUNTER — Ambulatory Visit: Payer: Medicare Other | Admitting: Internal Medicine

## 2023-11-07 VITALS — BP 130/70 | HR 60 | Ht <= 58 in | Wt 130.0 lb

## 2023-11-07 DIAGNOSIS — E042 Nontoxic multinodular goiter: Secondary | ICD-10-CM | POA: Diagnosis not present

## 2023-11-07 NOTE — Progress Notes (Unsigned)
Name: Megan [name]  MRN/ DOB: [id number], 01/11/1946    Age/ Sex: 77 y.o., female    PCP: Willow Ora, MD   Reason for Endocrinology Evaluation: MNG     Date of Initial Endocrinology Evaluation: 11/10/2022    HPI: Megan [name] is a 77 y.o. female with a past medical history of PVD, MNG , COPD and CAD . The patient presented for initial endocrinology clinic visit on 11/10/2022 for consultative assistance with her MNG.   She had an incidental finding of a right thyroid nodule 2.1 cm on CT scan which promoted a thyroid ultrasound revealing MNG with a right inferior 2.5 nodule meeting FNA criteria.    She has been diagnosed with thyroid nodules  ~25 years ago . She has has at least two aspirations , the last one in her 45's. [facility] until 2021  ( Dr. Evlyn Kanner )  She is S/P FNA of the 2.5 right inferior nodule 12/09/2021 with Bethesda Category III  Afirma was benign   Sister with thyroid disease , S/P RAi ablation     SUBJECTIVE:    Today (11/07/23):  Megan [name] is here for follow-up on thyroid nodule.  Weight continues to fluctuate  Completed pulmonary rehab , received Abx and prednisone through pulmonary  Denies local neck enlargement  Denies palpitations  Denies  diarrhea but has minimal constipation due to pain medications Feels sleepy, had sleep study last year  No Biotin intake    HISTORY:  Past Medical History:  Past Medical History:  Diagnosis Date   AAA (abdominal aortic aneurysm) (HCC)    a. s/p stent grafting in 2009.   Adenomatous colon polyp 05/2001   Adenomatous duodenal polyp    Allergy    Anginal pain (HCC)    COPD (chronic obstructive pulmonary disease) (HCC)    pt denies COPD   Coronary artery disease    a. s/p CABG in 1995;  b. 05/2013 Neg MV, EF 82%;  c. 06/2013 Cath: LM nl, LAD 40-50p, LCX nl, RCA 80p/173m, VG->RCA->PDA->PLSA min irregs, LIMA->LAD atretic, vigorous LV fxn.   Diabetes mellitus without complication (HCC)     history of, resolved. 2017   Diverticulosis    Eczema, dyshidrotic    Eczema, dyshidrotic 1986   Dr. Gerilyn Nestle   Emphysema of lung [facility])    Fatty liver    Fibromyalgia    GERD (gastroesophageal reflux disease)    Hyperlipidemia    Hypertension    Migraine    Myocardial infarction [facility])    Osteoarthritis    Osteoporosis    unsure, having a bone scan soon   Peripheral arterial disease (HCC)    left leg diagnosed by Dr Patty Sermons   Renal artery stenosis [facility])    a. 2008 s/p PTA   Past Surgical History:  Past Surgical History:  Procedure Laterality Date   ABDOMINAL AORTAGRAM N/A 03/06/2014   Procedure: ABDOMINAL AORTAGRAM;  Surgeon: Larina Earthly, MD;  Location: [facility] CATH LAB;  Service: Cardiovascular;  Laterality: N/A;   ABDOMINAL AORTIC ANEURYSM REPAIR  2009   stenting   ABDOMINAL HYSTERECTOMY     Total, 1992   BREAST BIOPSY     CARDIAC CATHETERIZATION     CARDIOVASCULAR STRESS TEST  11/11/2009   normal study   CARPAL TUNNEL RELEASE  08/1993   left wrist   CARPAL TUNNEL RELEASE  01/1997   right   CATARACT EXTRACTION, BILATERAL  2021   CORONARY ARTERY BYPASS  GRAFT  07/1994   Triple by Dr Ofilia Neas   CORONARY STENT INTERVENTION N/A 02/02/2021   Procedure: CORONARY STENT INTERVENTION;  Surgeon: Yvonne Kendall, MD;  Location: [facility] INVASIVE CV LAB;  Service: Cardiovascular;  Laterality: N/A;   CORONARY STENT INTERVENTION N/A 06/26/2021   Procedure: CORONARY STENT INTERVENTION;  Surgeon: Tonny Bollman, MD;  Location: [facility] INVASIVE CV LAB;  Service: Cardiovascular;  Laterality: N/A;   CORONARY ULTRASOUND/IVUS N/A 06/26/2021   Procedure: Intravascular Ultrasound/IVUS;  Surgeon: Tonny Bollman, MD;  Location: [facility] INVASIVE CV LAB;  Service: Cardiovascular;  Laterality: N/A;   CORONARY ULTRASOUND/IVUS N/A 09/03/2021   Procedure: Intravascular Ultrasound/IVUS;  Surgeon: Orbie Pyo, MD;  Location: [facility] INVASIVE CV LAB;  Service: Cardiovascular;  Laterality: N/A;   dental implants     EYE  SURGERY  03/2020   cataract   HAND SURGERY Right 09/2002   Right hand pulley release   LAMINECTOMY WITH POSTERIOR LATERAL ARTHRODESIS LEVEL 1 N/A 03/24/2022   Procedure: LAMINECTOMY, FORAMINOTOMY, LUMBAR TWO- THREE ; INTERTRANSVERSE FUSION LUMBAR TWO - LUMBAR THREE RIGHT;  Surgeon: Tia Alert, MD;  Location: [facility] OR;  Service: Neurosurgery;  Laterality: N/A;   LEFT HEART CATH AND CORONARY ANGIOGRAPHY N/A 06/26/2021   Procedure: LEFT HEART CATH AND CORONARY ANGIOGRAPHY;  Surgeon: Tonny Bollman, MD;  Location: [facility] INVASIVE CV LAB;  Service: Cardiovascular;  Laterality: N/A;   LEFT HEART CATH AND CORS/GRAFTS ANGIOGRAPHY N/A 02/02/2021   Procedure: LEFT HEART CATH AND CORS/GRAFTS ANGIOGRAPHY;  Surgeon: Yvonne Kendall, MD;  Location: [facility] INVASIVE CV LAB;  Service: Cardiovascular;  Laterality: N/A;   LEFT HEART CATH AND CORS/GRAFTS ANGIOGRAPHY N/A 09/03/2021   Procedure: LEFT HEART CATH AND CORS/GRAFTS ANGIOGRAPHY;  Surgeon: Orbie Pyo, MD;  Location: [facility] INVASIVE CV LAB;  Service: Cardiovascular;  Laterality: N/A;   LEFT HEART CATH AND CORS/GRAFTS ANGIOGRAPHY N/A 07/15/2022   Procedure: LEFT HEART CATH AND CORS/GRAFTS ANGIOGRAPHY;  Surgeon: Swaziland, Peter M, MD;  Location: [facility] INVASIVE CV LAB;  Service: Cardiovascular;  Laterality: N/A;   LEFT HEART CATHETERIZATION WITH CORONARY/GRAFT ANGIOGRAM N/A 06/11/2013   Procedure: LEFT HEART CATHETERIZATION WITH Isabel Caprice;  Surgeon: Vesta Mixer, MD;  Location: [facility] CATH LAB;  Service: Cardiovascular;  Laterality: N/A;   MULTIPLE TOOTH EXTRACTIONS  2000   All upper teeth.  Has full dneture.    RENAL ARTERY STENT  2008   SHOULDER ARTHROSCOPY WITH ROTATOR CUFF REPAIR Right 09/27/2018   Procedure: Right shoulder mini open rotator cuff repair;  Surgeon: Jene Every, MD;  Location: [facility] ORS;  Service: Orthopedics;  Laterality: Right;  90 mins   thyroid cyst apiration  05/1997 and 07/1998   TONSILECTOMY, ADENOIDECTOMY, BILATERAL MYRINGOTOMY AND  TUBES  1989   TONSILLECTOMY     TOTAL ABDOMINAL HYSTERECTOMY  1992    Social History:  reports that she quit smoking about 27 years ago. Her smoking use included cigarettes. She has never been exposed to tobacco smoke. She has never used smokeless tobacco. She reports current alcohol use of about 6.0 - 10.0 standard drinks of alcohol per week. She reports that she does not use drugs. Family History: family history includes AAA (abdominal aortic aneurysm) in her brother; Breast cancer in her sister; Colon cancer in her cousin and paternal grandmother; Diabetes in her brother and sister; Heart attack in her brother, father, mother, and sister; Heart disease in her brother, brother, father, mother, and sister; Hyperlipidemia in her brother and sister; Hypertension in her brother and sister; Liver cancer in her sister.  HOME MEDICATIONS: Allergies as of 11/07/2023       Reactions   Brilinta [ticagrelor] Shortness Of Breath   Bextra [valdecoxib] Other (See Comments)   Increased lft's   Hydrocod Poli-chlorphe Poli Er Rash   Lipitor [atorvastatin] Other (See Comments)   Leg weakness   Mevacor [lovastatin] Other (See Comments)   Muscle weakness   Plavix [clopidogrel Bisulfate] Itching, Other (See Comments)   Hands and feet tingle and itch   Zocor [simvastatin] Other (See Comments)   Bones hurt   Doxycycline Diarrhea, Nausea And Vomiting   Metoprolol Other (See Comments)   Hair loss   Morphine And Codeine Itching, Nausea Only   Codeine Itching, Rash   Hyper-active   Lisinopril Cough        Medication List        Accurate as of November 07, 2023 10:33 AM. If you have any questions, ask your nurse or doctor.          Accu-Chek FastClix Lancets Misc Check blood sugar once daily PRN   Accu-Chek SmartView test strip Generic drug: glucose blood CHECK BLOOD SUGAR ONCE DAILY AS NEEDED   albuterol 108 (90 Base) MCG/ACT inhaler Commonly known as: ProAir HFA Inhale 2 puffs into the  lungs every 6 (six) hours as needed for wheezing or shortness of breath.   albuterol (2.5 MG/3ML) 0.083% nebulizer solution Commonly known as: PROVENTIL Take 3 mLs (2.5 mg total) by nebulization every 6 (six) hours as needed for wheezing or shortness of breath.   ALPRAZolam 0.25 MG tablet Commonly known as: XANAX Take 1 tablet (0.25 mg total) by mouth at bedtime as needed for sleep.   amLODipine 5 MG tablet Commonly known as: NORVASC Take 1/2 tablet ( 2.5 mg ) daily   amoxicillin-clavulanate 875-125 MG tablet Commonly known as: AUGMENTIN Take 1 tablet by mouth 2 (two) times daily.   aspirin EC 81 MG tablet Take 1 tablet (81 mg total) by mouth daily.   atorvastatin 80 MG tablet Commonly known as: LIPITOR TAKE 1 TABLET BY MOUTH EVERY DAY   B-100 COMPLEX PO Take 1 tablet by mouth every evening.   bismuth subsalicylate 262 MG/15ML suspension Commonly known as: PEPTO BISMOL Take 30 mLs by mouth every 6 (six) hours as needed for indigestion or diarrhea or loose stools.   Breztri Aerosphere 160-9-4.8 MCG/ACT Aero Generic drug: Budeson-Glycopyrrol-Formoterol Inhale 2 puffs into the lungs in the morning and at bedtime.   celecoxib 200 MG capsule Commonly known as: CELEBREX Take 200 mg by mouth daily as needed.   cetirizine 10 MG tablet Commonly known as: ZYRTEC Take 10 mg by mouth daily as needed for allergies.   cholecalciferol 25 MCG (1000 UNIT) tablet Commonly known as: VITAMIN D3 Take 1,000 Units by mouth every evening.   Cordran 4 MCG/SQCM Tape Generic drug: Flurandrenolide Apply 1 each topically daily as needed (Rash).   cycloSPORINE 0.05 % ophthalmic emulsion Commonly known as: RESTASIS Place 1 drop into both eyes 2 (two) times daily.   Ergocalciferol 50 MCG (2000 UT) Caps Take by mouth.   ezetimibe 10 MG tablet Commonly known as: ZETIA TAKE 1 TABLET BY MOUTH EVERY DAY   Fluzone High-Dose 0.5 ML injection Generic drug: Influenza vac split quadrivalent  PF   gabapentin 300 MG capsule Commonly known as: NEURONTIN Take 1 capsule (300 mg total) by mouth 2 (two) times daily.   GAS-X PO Take 1-2 tablets by mouth daily as needed (gas).   HYDROcodone bit-homatropine 5-1.5 MG/5ML syrup Commonly known  as: HYCODAN Take 5 mLs by mouth every 6 (six) hours as needed for cough.   isosorbide mononitrate 30 MG 24 hr tablet Commonly known as: IMDUR TAKE 3 TABLETS ( 90 MG ) EVERY MORNING AND 1 TABLET (30 MG ) EVERY EVENING   losartan 100 MG tablet Commonly known as: COZAAR TAKE 1 TABLET BY MOUTH EVERY DAY   metoprolol succinate 25 MG 24 hr tablet Commonly known as: TOPROL-XL TAKE 1 TABLET (25 MG TOTAL) BY MOUTH DAILY.   multivitamin with minerals tablet Take 1 tablet by mouth every evening.   nitroGLYCERIN 0.4 MG SL tablet Commonly known as: NITROSTAT Place 1 tablet (0.4 mg total) under the tongue every 5 (five) minutes as needed for chest pain.   omeprazole 40 MG capsule Commonly known as: PRILOSEC TAKE 1 CAPSULE (40 MG TOTAL) BY MOUTH IN THE MORNING AND AT BEDTIME.   ondansetron 8 MG tablet Commonly known as: Zofran Take 1 tablet (8 mg total) by mouth every 6 (six) hours as needed for nausea or vomiting.   oxyCODONE-acetaminophen 10-325 MG tablet Commonly known as: PERCOCET Take 1 tablet by mouth in the morning and at bedtime. Can take one tablet three times a day as needed for back pain   polyethylene glycol 17 g packet Commonly known as: MIRALAX / GLYCOLAX Take 17 g by mouth daily as needed for moderate constipation.   prasugrel 5 MG Tabs tablet Commonly known as: EFFIENT TAKE 1 TABLET (5 MG TOTAL) BY MOUTH DAILY.   predniSONE 20 MG tablet Commonly known as: DELTASONE Take 2 tablets (40 mg total) by mouth daily with breakfast for 5 days, THEN 1 tablet (20 mg total) daily with breakfast for 5 days. Start taking on: November 01, 2023   PRESCRIPTION MEDICATION Place 2 puffs into both ears daily as needed (ear infections).  CSAHC ear powder   Spikevax syringe Generic drug: COVID-19 mRNA vaccine   torsemide 20 MG tablet Commonly known as: DEMADEX Take 1 tablet (20 mg total) by mouth daily. Take 1/2 tablet daily as needed for swelling   tretinoin 0.1 % cream Commonly known as: RETIN-A Apply 1 application. topically every evening.          REVIEW OF SYSTEMS: A comprehensive ROS was conducted with the patient and is negative except as per HPI    OBJECTIVE:  VS: BP 130/70 (BP Location: Left Arm, Patient Position: Sitting, Cuff Size: Small)   Pulse 60   Ht 4\' 10"  (1.473 m)   Wt 130 lb (59 kg)   SpO2 95%   BMI 27.17 kg/m    Wt Readings from Last 3 Encounters:  11/07/23 130 lb (59 kg)  09/06/23 132 lb 7.9 oz (60.1 kg)  08/29/23 130 lb 12.8 oz (59.3 kg)     EXAM: General: Pt appears well and is in NAD  Neck: General: Supple without adenopathy. Thyroid: Right thyroid nodule appreciated and left inferior nodule appreciated  R> L   Lungs: Clear with good BS bilat with no rales, rhonchi, or wheezes  Heart: Auscultation: RRR.  Abdomen: Normoactive bowel sounds, soft, nontender, without masses or organomegaly palpable  Extremities:  BL LE: No pretibial edema normal ROM and strength.  Mental Status: Judgment, insight: Intact Orientation: Oriented to time, place, and person Mood and affect: No depression, anxiety, or agitation     DATA REVIEWED: ****  Thyroid Ultrasound 11/22/2022  FINDINGS: Parenchymal Echotexture: Moderately heterogenous   Isthmus: 0.7 cm   Right lobe: 4.6 x 1.9 x 2.9 cm  Left lobe: 4.4 x 1.4 x 1.1 cm   _________________________________________________________   Estimated total number of nodules >/= 1 cm: 4   Number of spongiform nodules >/=  2 cm not described below (TR1): 0   Number of mixed cystic and solid nodules >/= 1.5 cm not described below (TR2): 0   _________________________________________________________   Nodule # 2:   Location: Right;  Superior   Maximum size: 1.2 cm; Other 2 dimensions: 1.1 x 1.1 cm   Composition: solid/almost completely solid (2)   Echogenicity: hypoechoic (2)   Shape: not taller-than-wide (0)   Margins: smooth (0)   Echogenic foci: none (0)   ACR TI-RADS total points: 4.   ACR TI-RADS risk category: TR4 (4-6 points).   ACR TI-RADS recommendations:   *Given size (>/= 1 - 1.4 cm) and appearance, a follow-up ultrasound in 1 year should be considered based on TI-RADS criteria.   _________________________________________________________   Nodule # 3: Previously biopsied nodule in the right mid gland is essentially unchanged at 2.5 x 2.4 x 2.3 cm compared to 2.6 x 2.5 x 2.1 cm previously.   Nodule # 4: Simple cyst in the left upper gland measures up to 1.6 cm. This is considered sonographically benign and does not warrant further evaluation.   Nodule # 5: Subcentimeter nodule in the left mid gland. No further follow-up.   Nodule # 6: Cluster of small minimally complex cysts in the left inferior gland. No further follow-up.   IMPRESSION: 1. No interval change in the size or appearance of previously biopsied nodule in the right mid gland. 2. Approximately 1.2 cm TI-RADS category 4 nodule in the right upper gland meets criteria for imaging surveillance. Recommend follow-up ultrasound in 1 year. 3. No new nodules or suspicious features.      FNA right inferior 2.5 cm nodule 12/09/2021  Clinical History: Right inferior, 2.5 cm; Other 2 dimensions: 2.5 x 2.1  cm, Solid/almost completely solid, Isoechoic, TI-RADS total points: 6  Specimen Submitted:  A. THYROID, RIGHT INFERIOR NODULE #3, FINE NEEDLE  ASPIRATION:    FINAL MICROSCOPIC DIAGNOSIS:  - Atypia of undetermined significance (Bethesda category III)    Afirma Benign   ASSESSMENT/PLAN/RECOMMENDATIONS:   Multinodular Goiter:   - NO local neck symptoms  - She is S/P  FNA of the right inferior 2.5 cm nodule in 12/2021 ,  with cytology report consistent with Bethesda category III.  Afirma was benign -TSH is slightly below normal, will repeat in 6 weeks as this could be caused by prednisone that she is taking from bronchitis -We will also make sure that she is not on biotin and this is not an assay interference     F/ in 1 yr    Signed electronically by: Lyndle Herrlich, MD  [facility] Endocr[facility]ology  [facility] [address] [place], [place] [address] Phone: [phone] FAX: [phone]   CC: Willow Ora, MD [address] [place] [address] Phone: [phone] Fax: [phone]   Return to Endocrinology clinic as below: Future Appointments  Date Time Provider Department Center  11/08/2023 11:00 AM Henrene Pastor, RPH-CPP [facility] None  11/21/2023 10:15 AM Hunsucker, Lesia Sago, MD [facility] None  12/26/2023 10:30 AM McGreal, Durene Romans, MD [facility]  02/13/2024 10:20 AM [place]az[place], Peter M, MD [facility] None

## 2023-11-08 ENCOUNTER — Other Ambulatory Visit: Payer: Self-pay | Admitting: Pharmacist

## 2023-11-08 ENCOUNTER — Encounter: Payer: Self-pay | Admitting: Pulmonary Disease

## 2023-11-08 LAB — TSH: TSH: 0.97 m[IU]/L (ref 0.40–4.50)

## 2023-11-08 LAB — T3, FREE: T3, Free: 3 pg/mL (ref 2.3–4.2)

## 2023-11-08 LAB — T4, FREE: Free T4: 1.4 ng/dL (ref 0.8–1.8)

## 2023-11-08 NOTE — Telephone Encounter (Signed)
Patient has been approved to receive Breztri from [facility] and Me medication assistance program thru 12/05/2024. Program needs updated Rx sent to [facility].  Rx pending for PCP to review and sign if agrees.

## 2023-11-08 NOTE — Progress Notes (Signed)
11/08/2023 Name: Megan [name] MRN: [id number] DOB: [birth year]  Chief Complaint  Patient presents with   Medication Management    ATTALIA [name] is a 77 y.o. year old female who presented for a telephone visit.   They were referred to the pharmacist by  self referral / previously engaged with [facility] team  for assistance in managing medication access.    Subjective:  Medication Access/Adherence  Current Pharmacy:  [id number] [place], [place] - [address] [place] [address] Phone: [phone] Fax: [phone]  [facility] - [place], [place] - [address] [place] [address] Phone: [phone] Fax: [phone]  [id number] [place], [place] - [address] [place] [address] Phone: [phone] Fax: [phone]   Patient reports affordability concerns with their medications: Yes  - Breztri inhaler Patient reports access/transportation concerns to their pharmacy: No  Patient reports adherence concerns with their medications:  No      Medication Management:  Receiving Breztri inhaler from [facility] and Me program thru 12/06/2023.  Patient completed re-enrollment for 2025 but has not received letter or written notification.     Patient reports Good adherence to medications  Patient reports the following barriers to adherence:  Complex medication regimen Medication cost.    Objective:  Lab Results  Component Value Date   HGBA1C 6.3 (H) 08/25/2023    Lab Results  Component Value Date   CREATININE 0.74 08/25/2023   BUN 11 08/25/2023   NA 136 08/25/2023   K 4.1 08/25/2023   CL 94 (L) 08/25/2023   CO2 29 08/25/2023    Lab Results  Component Value Date   CHOL 159 08/25/2023   HDL 61 08/25/2023   LDLCALC 53 08/25/2023   LDLDIRECT 82.0 06/13/2020   TRIG 293 08/25/2023   CHOLHDL 2.6 08/25/2023    Medications Reviewed Today     Reviewed by Henrene Pastor,  RPH-CPP (Pharmacist) on 11/08/23 at 1215  Med List Status: <None>   Medication Order Taking? Sig Documenting Provider Last Dose Status Informant  Accu-Chek FastClix Lancets MISC [id number]  Check blood sugar once daily PRN Willow Ora, MD  Active Self           Med Note Cyndie Chime, ANH T   Tue Jan 06, 2021  1:20 PM)    albuterol (PROAIR HFA) 108 (90 Base) MCG/ACT inhaler [id number] Yes Inhale 2 puffs into the lungs every 6 (six) hours as needed for wheezing or shortness of breath. Hunsucker, Lesia Sago, MD Taking Active   albuterol (PROVENTIL) (2.5 MG/3ML) 0.083% nebulizer solution [id number]  Take 3 mLs (2.5 mg total) by nebulization every 6 (six) hours as needed for wheezing or shortness of breath. Glenford Bayley, NP  Active   ALPRAZolam Prudy Feeler) 0.25 MG tablet [id number] Yes Take 1 tablet (0.25 mg total) by mouth at bedtime as needed for sleep. [place], Peter M, MD Taking Active Self  amLODipine (NORVASC) 5 MG tablet [id number] Yes Take 1/2 tablet ( 2.5 mg ) daily [place], Peter M, MD Taking Active   aspirin EC 81 MG tablet [id number] Yes Take 1 tablet (81 mg total) by mouth daily. [place], Peter M, MD Taking Active Self  atorvastatin (LIPITOR) 80 MG tablet [id number] Yes TAKE 1 TABLET BY MOUTH EVERY DAY [place], Peter M, MD Taking Active   B Complex-Biotin-FA (B-100 COMPLEX PO) [id number]  Take 1 tablet by mouth every evening. [provider]  Active  Self  bismuth subsalicylate (PEPTO BISMOL) 262 MG/15ML suspension [id number]  Take 30 mLs by mouth every 6 (six) hours as needed for indigestion or diarrhea or loose stools. [provider]  Active Self  Budeson-Glycopyrrol-Formoterol (BREZTRI AEROSPHERE) 160-9-4.8 MCG/ACT AERO [id number] Yes Inhale 2 puffs into the lungs in the morning and at bedtime. Hunsucker, Lesia Sago, MD Taking Active            Med Note Clydie Braun, [place] B   Tue Nov 08, 2023 11:21 AM) Getting from [facility] and Me thru 12/05/2024  celecoxib (CELEBREX) 200 MG capsule [id number] Yes  Take 200 mg by mouth daily as needed. [provider] Taking Active   cetirizine (ZYRTEC) 10 MG tablet [id number]  Take 10 mg by mouth daily as needed for allergies. [provider]  Active Self  cholecalciferol (VITAMIN D3) 25 MCG (1000 UNIT) tablet [id number]  Take 1,000 Units by mouth every evening. [provider]  Active Self  CORDRAN 4 MCG/SQCM TAPE [id number]  Apply 1 each topically daily as needed (Rash).  [provider]  Active Self  cycloSPORINE (RESTASIS) 0.05 % ophthalmic emulsion [id number]  Place 1 drop into both eyes 2 (two) times daily.  [provider]  Active Self  Ergocalciferol 50 MCG (2000 UT) CAPS [id number]  Take by mouth. [provider]  Active   ezetimibe (ZETIA) 10 MG tablet [id number] Yes TAKE 1 TABLET BY MOUTH EVERY DAY [place], Peter M, MD Taking Active   FLUZONE HIGH-DOSE 0.5 ML injection [id number]   [provider]  Active   gabapentin (NEURONTIN) 300 MG capsule [id number] Yes Take 1 capsule (300 mg total) by mouth 2 (two) times daily. Suanne Marker, MD Taking Active   glucose blood (ACCU-CHEK SMARTVIEW) test strip [id number]  CHECK BLOOD SUGAR ONCE DAILY AS NEEDED Willow Ora, MD  Active Self  HYDROcodone bit-homatropine (HYCODAN) 5-1.5 MG/5ML syrup [id number] Yes Take 5 mLs by mouth every 6 (six) hours as needed for cough. Hunsucker, Lesia Sago, MD Taking Active   isosorbide mononitrate (IMDUR) 30 MG 24 hr tablet [id number] Yes TAKE 3 TABLETS ( 90 MG ) EVERY MORNING AND 1 TABLET (30 MG ) EVERY EVENING [place], Peter M, MD Taking Active   losartan (COZAAR) 100 MG tablet [id number] Yes TAKE 1 TABLET BY MOUTH EVERY DAY Willow Ora, MD Taking Active   metoprolol succinate (TOPROL-XL) 25 MG 24 hr tablet [id number] Yes TAKE 1 TABLET (25 MG TOTAL) BY MOUTH DAILY. [place], Peter M, MD Taking Active   Multiple Vitamins-Minerals (MULTIVITAMIN WITH MINERALS) tablet [id number] Yes Take 1 tablet by mouth every evening.  [provider] Taking Active Self  nitroGLYCERIN (NITROSTAT) 0.4 MG SL tablet [id number]  Place 1 tablet (0.4 mg total) under the tongue every 5 (five) minutes as needed for chest pain. [place], Peter M, MD  Active   omeprazole (PRILOSEC) 40 MG capsule [id number] Yes TAKE 1 CAPSULE (40 MG TOTAL) BY MOUTH IN THE MORNING AND AT BEDTIME. Meryl Dare, MD Taking Active   ondansetron [facility]) 8 MG tablet [id number]  Take 1 tablet (8 mg total) by mouth every 6 (six) hours as needed for nausea or vomiting. Nelwyn Salisbury, MD  Active   oxyCODONE-acetaminophen (PERCOCET) 10-325 MG tablet [id number] Yes Take 1 tablet by mouth in the morning and at bedtime. Can take one tablet three times a day as needed for back pain [provider] Taking Active Self  polyethylene glycol (MIRALAX / GLYCOLAX) 17 g packet [id number]  Take 17  g by mouth daily as needed for moderate constipation. [provider]  Active Self  prasugrel (EFFIENT) 5 MG TABS tablet [id number] Yes TAKE 1 TABLET (5 MG TOTAL) BY MOUTH DAILY. [place], Peter M, MD Taking Active   predniSONE (DELTASONE) 20 MG tablet [id number] Yes Take 2 tablets (40 mg total) by mouth daily with breakfast for 5 days, THEN 1 tablet (20 mg total) daily with breakfast for 5 days. Hunsucker, Lesia Sago, MD Taking Active   PRESCRIPTION MEDICATION [id number]  Place 2 puffs into both ears daily as needed (ear infections). [facility] ear powder [provider]  Active Self  Simethicone (GAS-X PO) [id number]  Take 1-2 tablets by mouth daily as needed (gas). [provider]  Active Self  [facility] syringe [id number]   [provider]  Active   torsemide (DEMADEX) 20 MG tablet [id number] Yes Take 1 tablet (20 mg total) by mouth daily. Take 1/2 tablet daily as needed for swelling [place], Peter M, MD Taking Active   tretinoin (RETIN-A) 0.1 % cream [id number]  Apply 1 application. topically every evening. [provider]  Active Self               Assessment/Plan:   Medication Management: - Reviewed current medication list and updated.  - Called [place] and [place] program to verified that she has been re-enrolled for 2025. They are sending confirmation information to our office and we will scan into patient's chart. Will also send to Mrs. Thielke once received.  - Provided patient with my contact information incase she has any other quesitons about medications in future.  - Her next shipment will be sent 11/28/2023. Will need updated Rx for Breztri sent to [facility] [place] [facility] [place] [facility] for [place]25. Requesting from Dr Mardelle Matte.  Discussed 2025 Medicare changes.   Henrene Pastor, PharmD Clinical Pharmacist [facility]

## 2023-11-09 ENCOUNTER — Ambulatory Visit
Admission: RE | Admit: 2023-11-09 | Discharge: 2023-11-09 | Disposition: A | Discharge status: Home / Self Care | Payer: Medicare Other | Source: Ambulatory Visit | Attending: Internal Medicine | Admitting: Internal Medicine

## 2023-11-09 ENCOUNTER — Other Ambulatory Visit: Payer: Medicare Other

## 2023-11-09 DIAGNOSIS — E042 Nontoxic multinodular goiter: Secondary | ICD-10-CM

## 2023-11-09 MED ORDER — BREZTRI AEROSPHERE 160-9-4.8 MCG/ACT IN AERO: 2.0000 | INHALATION_SPRAY | Freq: Two times a day (BID) | RESPIRATORY_TRACT | 3 refills | Status: DC

## 2023-11-09 NOTE — Telephone Encounter (Signed)
No problem thanks 

## 2023-11-10 DIAGNOSIS — H04123 Dry eye syndrome of bilateral lacrimal glands: Secondary | ICD-10-CM | POA: Diagnosis not present

## 2023-11-14 ENCOUNTER — Telehealth: Payer: Self-pay

## 2023-11-14 DIAGNOSIS — R2681 Unsteadiness on feet: Secondary | ICD-10-CM

## 2023-11-14 DIAGNOSIS — R42 Dizziness and giddiness: Secondary | ICD-10-CM

## 2023-11-14 NOTE — Telephone Encounter (Signed)
Spoke to patient Dr.Jordan's advice given.Carotid dopplers scheduled 12/31 at 10:30 am at [facility] office.

## 2023-11-14 NOTE — Telephone Encounter (Signed)
Received B/P readings ranging 138/61,148/70,133/67,126/60,127/56,129/58, 124/74.Pulse 62,62,64,81,70,69,75.Patient complaining of lightheaded worse in mornings.Very wobbly at times.Message sent to Dr.Jordan for advice.

## 2023-11-15 ENCOUNTER — Ambulatory Visit: Payer: Medicare Other | Admitting: Gastroenterology

## 2023-11-21 ENCOUNTER — Ambulatory Visit: Payer: Medicare Other | Admitting: Pulmonary Disease

## 2023-11-21 ENCOUNTER — Encounter: Payer: Self-pay | Admitting: Pulmonary Disease

## 2023-11-21 VITALS — BP 145/80 | HR 77 | Ht 59.0 in | Wt 134.4 lb

## 2023-11-21 DIAGNOSIS — R0609 Other forms of dyspnea: Secondary | ICD-10-CM | POA: Diagnosis not present

## 2023-11-21 DIAGNOSIS — G4736 Sleep related hypoventilation in conditions classified elsewhere: Secondary | ICD-10-CM

## 2023-11-21 DIAGNOSIS — J439 Emphysema, unspecified: Secondary | ICD-10-CM

## 2023-11-21 DIAGNOSIS — J432 Centrilobular emphysema: Secondary | ICD-10-CM

## 2023-11-21 MED ORDER — AZITHROMYCIN 250 MG PO TABS: 250.0000 mg | ORAL_TABLET | ORAL | 6 refills | Status: DC

## 2023-11-21 NOTE — Patient Instructions (Signed)
Nice to see you again  Take azithromycin 1 tablet on Monday, Wednesday, and Friday only.  See if this helps the congestion, tightness and prevent exacerbations.  Return to clinic in 4 months or sooner as needed

## 2023-11-21 NOTE — Progress Notes (Signed)
@Patient  ID: Megan [name], female    DOB: 1945-12-14, 77 y.o.   MRN: [id number]  Chief Complaint  Patient presents with   Follow-up    SOB while walking/sitting, congested cough w/ mucus in morning (green)    Referring provider: Willow Ora, MD  HPI:   76 y.o. whom are seeing for acute visit for evaluation of cough productive green phlegm, wheezing, chills previously seen for evaluation of dyspnea on exertion related to emphysema and likely cardiac causes.  Most recent PCP note reviewed.    Most recent cardiology note reviewed.  Overall doing okay.  After Thanksgiving had worsening cough congestion and shortness of breath.  Prescribed prednisone and antibiotic.  Improved.  A lot of increased energy on the prednisone.  Little bit of chest tightness and a little bit worsens respite last 2 to 3 days.  Seems a bit better today.  Discussed likely gradual worsening of breathing over time.  Each exacerbation taking a little bit of lung function and making her baseline worse.  In the interim she did pulmonary rehab.  Seem to help.  We discussed additional pharmacologic strategies to help with her breathing and prevent exacerbations.  HPI at initial visit: She was in her usual state of health.  She developed onset of shortness of breath a few weeks ago.  Different than baseline dyspnea.  Recurrent shorter distances.  Preceded with aches, sounds like viral infection.  Still some heart ache as well which is concerning to her given her early CAD and significant coronary history.  This prompted presentation to the ED.  There CTA PE protocol was negative for PE but showed presevere emphysematous changes with predilection for the upper lobes otherwise clear on my review and interpretation.  She subsequent underwent a left heart catheterization showed stable coronary artery disease and prior PCI and CABG.  This was reassuring.  In the interim she was placed on Breztri.  She flexes helped some with her  shortness of breath.  Able to do more.  Still get a little bit winded on longer walks etc.  She used to walk a mile and a half a day.  She is working back to getting to that distance.  PMH: Tobacco abuse in remission, hypertension, CAD, asthma, hyperlipidemia, seasonal allergies Surgical history: Back surgery, CABG, PCI Family history: Mother and father with CAD Social history: Former smoker, quit 1997, lives in [place] / Pulmonary Flowsheets:   ACT:      No data to display          MMRC:     No data to display          Epworth:      No data to display          Tests:   FENO:  No results found for: "NITRICOXIDE"  PFT:    Latest Ref Rng & Units 03/03/2023    1:20 PM  PFT Results  FVC-Pre L 1.85   FVC-Predicted Pre % 83   FVC-Post L 1.84   FVC-Predicted Post % 82   Pre FEV1/FVC % % 76   Post FEV1/FCV % % 76   FEV1-Pre L 1.41   FEV1-Predicted Pre % 85   FEV1-Post L 1.39   DLCO uncorrected ml/min/mmHg 9.07   DLCO UNC% % 55   DLCO corrected ml/min/mmHg 8.81   DLCO COR %Predicted % 53   DLVA Predicted % 67   TLC L 3.82   TLC % Predicted %  85   RV % Predicted % 92     WALK:     09/15/2023    3:16 PM 06/22/2023   10:29 AM 06/02/2023    4:34 PM 02/17/2023    4:46 PM  SIX MIN WALK  Supplimental Oxygen during Test? (L/min)   Yes Yes  O2 Flow Rate   4 L/min 2 L/min  Type   Pulse Continuous  2 Minute Oxygen Saturation % 90 % 88 %    2 Minute HR 89 80    4 Minute Oxygen Saturation % 92 % 90 %    4 Minute HR 94 79    6 Minute Oxygen Saturation % 90 % 91 %    6 Minute HR 98 80    Tech Comments:   Patient walked 2 laps on POC to see what pulsed level she needed to keep sats >88-90%.  She requires 4L pulsed to stay above 88-90%.  Tolerated walk well,. Patient SaO2 when brought back to exam room was 88% on room air.  Patient was immediately put on oxygen 2 lpm continuous.  After 5 minutes, SaO2 at 93%. Patient was able to walk 1 lap on 2  lpm continuous oxygen (tank) and SaO2 at 94%.  Patient c/o fatigue.  Patient was unable to walk any further laps.  Patient taken back to exam room. and SaO2 at  94% and heart rate 80 bpm.  Patient left with an oxygen tank from [employer] (from closet).  Oxygen set on 2 lpm when patient taken to waiting room.  Patient did require assistance to get the oxygen tank/roller in her car.    Imaging: Personally reviewed and as per EMR discussion in this note US THYROID Result Date: 11/13/2023 CLINICAL DATA:  Prior ultrasound follow-up. EXAM: THYROID ULTRASOUND TECHNIQUE: Ultrasound examination of the thyroid gland and adjacent soft tissues was performed. COMPARISON:  11/22/2022, 08/26/2021, 12/09/2021 FINDINGS: Parenchymal Echotexture: Moderately heterogeneous Isthmus: 0.6 cm ,previously 0.7 cm Right lobe: 4.5 x 3.1 x 2.7 cm ,previously 4.6 x 1.9 x 2.2 cm Left lobe: 4.8 x 1.7 x 1.4 cm ,previously 4.4 x 1.4 x 1.1 cm ________________________________________________________ Estimated total number of nodules >/= 1 cm: 6-10 Number of spongiform nodules >/=  2 cm not described below (TR1): 0 Number of mixed cystic and solid nodules >/= 1.5 cm not described below (TR2): 0 _________________________________________________________ Similar benign appearing inferior isthmus nodule (labeled 1, 0.9 cm, previously 1.2 cm). Nodule # 2: Location: Right; Superior Maximum size: 1.1 cm; Other 2 dimensions: 0.9 x 0.7 cm Composition: solid/almost completely solid (2) Echogenicity: isoechoic (1) Shape: not taller-than-wide (0) Margins: smooth (0) Echogenic foci: none (0) ACR TI-RADS total points: 3. ACR TI-RADS risk category: TR3 (3 points). ACR TI-RADS recommendations: Given size (<1.4 cm) and appearance, this nodule does NOT meet TI-RADS criteria for biopsy or dedicated follow-up. _________________________________________________________ Nodule # 3 (previously labeled 2): Prior biopsy: No Location: Right; Superior Maximum size: 1.7 cm; Other  2 dimensions: 1.4 x 1.2 cm, previously, 1.2 x 1.1 x 1.1 cm Composition: mixed cystic and solid (1) Echogenicity: hypoechoic (2) Shape: not taller-than-wide (0) Margins: ill-defined (0) Echogenic foci: none (0) ACR TI-RADS total points: 3. ACR TI-RADS risk category:  TR3 (3 points). Significant change in size (>/= 20% in two dimensions and minimal increase of 2 mm): Yes Change in features: Yes Change in ACR TI-RADS risk category: Yes ACR TI-RADS recommendations: *Given size (>/= 1.5 - 2.4 cm) and appearance, a follow-up ultrasound in 1 year should be considered  based on TI-RADS criteria. _________________________________________________________ Similar appearance of previously biopsied solid nodule in the right mid thyroid (labeled 4, 2.4 cm, previously labeled 3, 2.5 cm). Mildly enlarged but similar benign appearing cystic nodule in the left superior thyroid (labeled 5, 2.1 cm, previously labeled 4, 1.6 cm). Similar benign appearing spongiform nodules in the left mid inferior thyroid. No cervical lymphadenopathy. IMPRESSION: 1. Similar appearing multinodular thyroid. 2. Slight interval enlargement of previously visualized right superior thyroid nodule (labeled 3, 1.7 cm, previously labeled 2, 1.2 cm) however with development of cystic spaces. This nodule is now TI-RADS category 3 and again meets criteria for 1 year ultrasound surveillance. 3. Similar appearance of previously biopsied solid nodule in the right mid thyroid (labeled 4, 2.4 cm, previously with 3, 2.5 cm). Recommend correlation with prior biopsy results. The above is in keeping with the ACR TI-RADS recommendations - J Am Coll Radiol 2017;14:587-595. Marliss Coots, MD [facility] Radiology Electronically Signed   By: Marliss Coots M.D.   On: 11/13/2023 07:10    Lab Results: Personally reviewed CBC    Component Value Date/Time   WBC 15.0 (H) 02/17/2023 1026   RBC 4.66 02/17/2023 1026   HGB 14.4  02/17/2023 1026   HGB 15.2 06/29/2022 1538   HCT 41.7 02/17/2023 1026   HCT 45.3 06/29/2022 1538   PLT 259.0 02/17/2023 1026   PLT 272 06/29/2022 1538   MCV 89.3 02/17/2023 1026   MCV 89 06/29/2022 1538   MCH 29.5 07/11/2022 1912   MCHC 34.5 02/17/2023 1026   RDW 12.8 02/17/2023 1026   RDW 12.4 06/29/2022 1538   LYMPHSABS 1.2 02/17/2023 1026   LYMPHSABS 2.0 01/06/2022 0954   MONOABS 0.9 02/17/2023 1026   EOSABS 0.1 02/17/2023 1026   EOSABS 0.2 01/06/2022 0954   BASOSABS 0.0 02/17/2023 1026   BASOSABS 0.0 01/06/2022 0954    BMET    Component Value Date/Time   NA 136 08/25/2023 0000   K 4.1 08/25/2023 0000   CL 94 (L) 08/25/2023 0000   CO2 29 08/25/2023 0000   GLUCOSE 113 (H) 08/25/2023 0000   GLUCOSE 119 (H) 02/17/2023 1026   BUN 11 08/25/2023 0000   CREATININE 0.74 08/25/2023 0000   CREATININE 0.71 06/29/2016 1057   CALCIUM 10.0 08/25/2023 0000   GFRNONAA >60 07/11/2022 1912   GFRAA 89 01/01/2020 0835    BNP    Component Value Date/Time   BNP 94.2 06/29/2022 1538   BNP 862.1 (H) 01/08/2021 1630    ProBNP    Component Value Date/Time   PROBNP 145.0 (H) 02/17/2023 1026    Specialty Problems       Pulmonary Problems   COPD with chronic bronchitis (HCC)   Centrilobular emphysema (HCC)   Chronic respiratory failure (HCC)   Pneumonia   Oxygen dependent    Allergies  Allergen Reactions   Brilinta [Ticagrelor] Shortness Of Breath   Bextra [Valdecoxib] Other (See Comments)    Increased lft's    Hydrocod Poli-Chlorphe Poli Er Rash   Lipitor [Atorvastatin] Other (See Comments)    Leg weakness    Mevacor [Lovastatin] Other (See Comments)    Muscle weakness    Plavix [Clopidogrel Bisulfate] Itching and Other (See Comments)    Hands and feet tingle and itch   Zocor [Simvastatin] Other (See Comments)    Bones hurt    Doxycycline Diarrhea and Nausea And Vomiting   Metoprolol Other (See Comments)    Hair loss   Morphine And Codeine Itching  and Nausea  Only   Codeine Itching and Rash    Hyper-active   Lisinopril Cough    Immunization History  Administered Date(s) Administered   Fluad Quad(high Dose 65+) 07/30/2019, 08/10/2022   Influenza, High Dose Seasonal PF 08/13/2017, 08/26/2018, 09/15/2020   Influenza, Seasonal, Injecte, Preservative Fre 09/21/2014, 09/06/2015, 07/21/2016   Influenza,inj,quad, With Preservative 08/06/2017   Influenza-Unspecified 08/10/2017, 10/13/2021   Moderna Covid-19 Fall Seasonal Vaccine 80yrs & older 08/13/2023   PFIZER(Purple Top)SARS-COV-2 Vaccination 12/22/2019, 01/12/2020, 09/15/2020   PPD Test 09/13/1979, 03/10/2021   Pfizer Covid-19 Vaccine Bivalent Booster 68yrs & up 10/13/2021   Pneumococcal Conjugate-13 12/31/2016   Pneumococcal Polysaccharide-23 12/16/2014, 08/01/2019   Respiratory Syncytial Virus Vaccine,Recomb Aduvanted(Arexvy) 10/06/2022   Td 05/13/2008   Td (Adult), 2 Lf Tetanus Toxid, Preservative Free 05/13/2008   Tdap 08/09/2012   Zoster Recombinant(Shingrix) 01/13/2018, 03/14/2018   Zoster, Live 11/03/2010    Past Medical History:  Diagnosis Date   AAA (abdominal aortic aneurysm) (HCC)    a. s/p stent grafting in 2009.   Adenomatous colon polyp 05/2001   Adenomatous duodenal polyp    Allergy    Anginal pain (HCC)    COPD (chronic obstructive pulmonary disease) (HCC)    pt denies COPD   Coronary artery disease    a. s/p CABG in 1995;  b. 05/2013 Neg MV, EF 82%;  c. 06/2013 Cath: LM nl, LAD 40-50p, LCX nl, RCA 80p/160m, VG->RCA->PDA->PLSA min irregs, LIMA->LAD atretic, vigorous LV fxn.   Diabetes mellitus without complication (HCC)    history of, resolved. 2017   Diverticulosis    Eczema, dyshidrotic    Eczema, dyshidrotic 1986   Dr. Gerilyn Nestle   Emphysema of lung [facility])    Fatty liver    Fibromyalgia    GERD (gastroesophageal reflux disease)    Hyperlipidemia    Hypertension    Migraine    Myocardial infarction [facility])    Osteoarthritis    Osteoporosis    unsure, having a  bone scan soon   Peripheral arterial disease (HCC)    left leg diagnosed by Dr Patty Sermons   Renal artery stenosis [facility])    a. 2008 s/p PTA    Tobacco History: Social History   Tobacco Use  Smoking Status Former   Current packs/day: 0.00   Types: Cigarettes   Quit date: 09/05/1996   Years since quitting: 27.2   Passive exposure: Never  Smokeless Tobacco Never   Counseling given: Not Answered   Continue to not smoke  Outpatient Encounter Medications as of 11/21/2023  Medication Sig   Accu-Chek FastClix Lancets MISC Check blood sugar once daily PRN   albuterol (PROAIR HFA) 108 (90 Base) MCG/ACT inhaler Inhale 2 puffs into the lungs every 6 (six) hours as needed for wheezing or shortness of breath.   albuterol (PROVENTIL) (2.5 MG/3ML) 0.083% nebulizer solution Take 3 mLs (2.5 mg total) by nebulization every 6 (six) hours as needed for wheezing or shortness of breath.   ALPRAZolam (XANAX) 0.25 MG tablet Take 1 tablet (0.25 mg total) by mouth at bedtime as needed for sleep.   aspirin EC 81 MG tablet Take 1 tablet (81 mg total) by mouth daily.   atorvastatin (LIPITOR) 80 MG tablet TAKE 1 TABLET BY MOUTH EVERY DAY   azithromycin (ZITHROMAX) 250 MG tablet Take 1 tablet (250 mg total) by mouth 3 (three) times a week. Monday Wednesday Friday   B Complex-Biotin-FA (B-100 COMPLEX PO) Take 1 tablet by mouth every evening.   bismuth subsalicylate (PEPTO BISMOL)  262 MG/15ML suspension Take 30 mLs by mouth every 6 (six) hours as needed for indigestion or diarrhea or loose stools.   Budeson-Glycopyrrol-Formoterol (BREZTRI AEROSPHERE) 160-9-4.8 MCG/ACT AERO Inhale 2 puffs into the lungs in the morning and at bedtime.   cetirizine (ZYRTEC) 10 MG tablet Take 10 mg by mouth daily as needed for allergies.   cholecalciferol (VITAMIN D3) 25 MCG (1000 UNIT) tablet Take 1,000 Units by mouth every evening.   CORDRAN 4 MCG/SQCM TAPE Apply 1 each topically daily as needed (Rash).    cycloSPORINE (RESTASIS)  0.05 % ophthalmic emulsion Place 1 drop into both eyes 2 (two) times daily.    Ergocalciferol 50 MCG (2000 UT) CAPS Take by mouth.   ezetimibe (ZETIA) 10 MG tablet TAKE 1 TABLET BY MOUTH EVERY DAY   FLUZONE HIGH-DOSE 0.5 ML injection    gabapentin (NEURONTIN) 300 MG capsule Take 1 capsule (300 mg total) by mouth 2 (two) times daily.   glucose blood (ACCU-CHEK SMARTVIEW) test strip CHECK BLOOD SUGAR ONCE DAILY AS NEEDED   isosorbide mononitrate (IMDUR) 30 MG 24 hr tablet TAKE 3 TABLETS ( 90 MG ) EVERY MORNING AND 1 TABLET (30 MG ) EVERY EVENING   losartan (COZAAR) 100 MG tablet TAKE 1 TABLET BY MOUTH EVERY DAY   metoprolol succinate (TOPROL-XL) 25 MG 24 hr tablet TAKE 1 TABLET (25 MG TOTAL) BY MOUTH DAILY.   Multiple Vitamins-Minerals (MULTIVITAMIN WITH MINERALS) tablet Take 1 tablet by mouth every evening.   nitroGLYCERIN (NITROSTAT) 0.4 MG SL tablet Place 1 tablet (0.4 mg total) under the tongue every 5 (five) minutes as needed for chest pain.   omeprazole (PRILOSEC) 40 MG capsule TAKE 1 CAPSULE (40 MG TOTAL) BY MOUTH IN THE MORNING AND AT BEDTIME.   ondansetron (ZOFRAN) 8 MG tablet Take 1 tablet (8 mg total) by mouth every 6 (six) hours as needed for nausea or vomiting.   oxyCODONE-acetaminophen (PERCOCET) 10-325 MG tablet Take 1 tablet by mouth in the morning and at bedtime. Can take one tablet three times a day as needed for back pain   polyethylene glycol (MIRALAX / GLYCOLAX) 17 g packet Take 17 g by mouth daily as needed for moderate constipation.   prasugrel (EFFIENT) 5 MG TABS tablet TAKE 1 TABLET (5 MG TOTAL) BY MOUTH DAILY.   PRESCRIPTION MEDICATION Place 2 puffs into both ears daily as needed (ear infections). [facility] ear powder   Simethicone (GAS-X PO) Take 1-2 tablets by mouth daily as needed (gas).   SPIKEVAX syringe    torsemide (DEMADEX) 20 MG tablet Take 1 tablet (20 mg total) by mouth daily. Take 1/2 tablet daily as needed for swelling   tretinoin (RETIN-A) 0.1 % cream Apply 1  application. topically every evening.   amLODipine (NORVASC) 5 MG tablet Take 1/2 tablet ( 2.5 mg ) daily (Patient not taking: Reported on 11/21/2023)   celecoxib (CELEBREX) 200 MG capsule Take 200 mg by mouth daily as needed. (Patient not taking: Reported on 11/21/2023)   HYDROcodone bit-homatropine (HYCODAN) 5-1.5 MG/5ML syrup Take 5 mLs by mouth every 6 (six) hours as needed for cough. (Patient not taking: Reported on 11/21/2023)   No facility-administered encounter medications on file as of 11/21/2023.     Review of Systems  Review of Systems  N/a Physical Exam  BP (!) 145/80   Pulse 77   Ht 4\' 11"  (1.499 m)   Wt 134 lb 6.4 oz (61 kg)   SpO2 95%   BMI 27.15 kg/m   Wt Readings  from Last 5 Encounters:  11/21/23 134 lb 6.4 oz (61 kg)  11/07/23 130 lb (59 kg)  09/06/23 132 lb 7.9 oz (60.1 kg)  08/29/23 130 lb 12.8 oz (59.3 kg)  08/23/23 131 lb 6.3 oz (59.6 kg)    BMI Readings from Last 5 Encounters:  11/21/23 27.15 kg/m  11/07/23 27.17 kg/m  09/06/23 27.69 kg/m  08/29/23 27.34 kg/m  08/23/23 25.66 kg/m     Physical Exam General: Well-appearing, sitting in chair Eyes: EOMI, no icterus Neck: Supple, no JVP Pulmonary: Clear, no wheeze no crackles, normal work of breathing Cardiovascular warm, no edema Abdomen: Nondistended, bowel sounds present MSK: No synovitis, no joint effusion Neuro: Normal gait, no weakness Psych: Normal mood, full affect   Assessment & Plan:   Dyspnea on exertion: Likely multifactorial related to cardiac issues, worsening diastology with exercise given significant left atrial dilation.  Also suspect large contribution from emphysema.  Severe emphysema on imaging.  Continue Breztri.  Continue cardiology follow-up.  PFTs normal with exception of decreased DLCO due to emphysema.  Nocturnal hypoxemia: Unlikely to improve.  Repeat over the oximetry fall 2024 with ongoing oxygen need.  Advised continued use of oxygen.  Hemoptysis: Scant.   Several discrete episodes in a month in September 2024, none since.  CT scan 08/2023 negative for causes of hemoptysis.  Likely related to chronic bronchitis.  Chronic cough: Related to emphysema.  Chronic bronchitis, asthma.  Continue Breztri.  Okay for intermittent courses of narcotic cough syrup given significant and severe coughing fits.  Trial of Monday Wednesday Friday low-dose azithromycin to try to prevent exacerbations etc.  New prescription today.  Return in about 4 months (around 03/21/2024) for f/u Dr. [name].   Karren Burly, MD 11/21/2023

## 2023-12-06 ENCOUNTER — Ambulatory Visit (HOSPITAL_COMMUNITY)
Admission: RE | Admit: 2023-12-06 | Discharge: 2023-12-06 | Disposition: A | Discharge status: Home / Self Care | Payer: Medicare Other | Source: Ambulatory Visit | Attending: Cardiovascular Disease | Admitting: Cardiovascular Disease

## 2023-12-06 DIAGNOSIS — R42 Dizziness and giddiness: Secondary | ICD-10-CM | POA: Diagnosis not present

## 2023-12-06 DIAGNOSIS — R2681 Unsteadiness on feet: Secondary | ICD-10-CM | POA: Diagnosis not present

## 2023-12-10 ENCOUNTER — Other Ambulatory Visit: Payer: Self-pay | Admitting: Cardiology

## 2023-12-12 ENCOUNTER — Other Ambulatory Visit: Payer: Self-pay | Admitting: Family Medicine

## 2023-12-12 DIAGNOSIS — Z1231 Encounter for screening mammogram for malignant neoplasm of breast: Secondary | ICD-10-CM

## 2023-12-13 ENCOUNTER — Telehealth: Payer: Self-pay | Admitting: Diagnostic Neuroimaging

## 2023-12-13 NOTE — Telephone Encounter (Signed)
 Pt states she was instructed by her cardiologist to inform us of her increased dizziness and balance issues. Requesting call back to discuss

## 2023-12-13 NOTE — Telephone Encounter (Signed)
 Schedule patient appt tomorrow at 10:30 am

## 2023-12-13 NOTE — Telephone Encounter (Signed)
 Spoke to patient Dr.Jordan's advice given.She will call Dr.Penumalli and schedule appointment.She will continue to monitor B/P and call back if elevated.

## 2023-12-13 NOTE — Telephone Encounter (Signed)
 Called the patient back. There was no answer. LVM advising the patient to call back so I can get some more information.  Pt would likely need a follow up visit since last visit was early last year. I have placed a spot on hold for tomorrow  at 10:30 am check in of 10 am if pt would like to come in for the apt.

## 2023-12-14 ENCOUNTER — Ambulatory Visit: Payer: Medicare Other | Admitting: Diagnostic Neuroimaging

## 2023-12-14 ENCOUNTER — Telehealth: Payer: Self-pay | Admitting: Diagnostic Neuroimaging

## 2023-12-14 ENCOUNTER — Encounter: Payer: Self-pay | Admitting: Diagnostic Neuroimaging

## 2023-12-14 VITALS — BP 141/65 | HR 61 | Ht 59.0 in | Wt 128.0 lb

## 2023-12-14 DIAGNOSIS — R531 Weakness: Secondary | ICD-10-CM

## 2023-12-14 DIAGNOSIS — H5500 Unspecified nystagmus: Secondary | ICD-10-CM

## 2023-12-14 DIAGNOSIS — H814 Vertigo of central origin: Secondary | ICD-10-CM

## 2023-12-14 NOTE — Patient Instructions (Signed)
  PROXIMAL GENERALIZED WEAKNESS (since June 2024) - check myopathy and MG labs - refer to PT, OT evaluation

## 2023-12-14 NOTE — Telephone Encounter (Signed)
 Pt would like PT/OT referrals sent to [facility].

## 2023-12-14 NOTE — Progress Notes (Signed)
 [facility]  PATIENT: Megan [name] DOB: 1946-02-18  REFERRING CLINICIAN: Jodie Lavern CROME, MD HISTORY FROM: patient  REASON FOR VISIT: follow up   HISTORICAL  CHIEF COMPLAINT:  Chief Complaint  Patient presents with   Follow-up    Patient in room #7 and alone. Patient states her balance has been off and gotten worse after her heart surgery. Patient states she doesn't have migraines but ben having headaches.    HISTORY OF PRESENT ILLNESS:   UPDATE (12/14/23, VRP): Since last visit, continues to have gait and balance difficulties.  No major migraines.  Having some mild headaches.  Sometimes feels like dizziness when she first gets up in the morning.  She feels like she is developing more generalized weakness especially in her shoulders and hips.  These have worsened in the last 4 to 6 months.  UPDATE (01/31/23, VRP): Since last visit, had back surgery April 2023 which helped some. Then Nov 2023 had upper resp infx and got abx and prednisone , and then balance got worse. Went to PT, and dx'd with possible BPPV. Also noted to have some nystagmus (downbeating) and sxs not improving, then neurology eval and MRI were requested.   UPDATE 09/09/21: 78 year old female here for evaluation of gait and balance difficulty.  Patient had progressive back pain for past few years.  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  She underwent multilevel decompression surgery in July 2021.  Following surgery her back pain improved but her gait worsened.  Follow-up MRI on 09/04/2020 showed worsening spinal stenosis at L2-3 level.  Patient has tried physical therapy and personal trainer without relief.  She has some intermittent gait difficulty where she feels like she loses balance.  She has not fallen.  She still able to perform her ADLs and walk up to 1 mile per day.  Patient referred here to rule out other causes of gait difficulty.  UPDATE (05/09/18, VRP): Since last visit, doing  well and migraine resolved on their own. No alleviating or aggravating factors.    Now with new onset of bilateral upper lip burning sensation and increased lower lip tingling since new dentures (upper and lower) since Jan 2019. Upper lip burning worse with patient has dentures in place, and slightly reduced when dentures taken out over night. Symptoms initially improving with denture adjustments, but now symptoms are slightly worse in last 2-3 weeks.   PRIOR HPI (08/10/17): 78 year old female here for evaluation of headaches.   Patient had onset of headaches around age 27 years old, sometimes preceded by seeing wavy lines of water. Headaches would be unilateral or bilateral, sometimes associated with unilateral hemibody numbness, sometimes a fascia, with headaches lasting up to one dated time. Patient had one or 2 headaches per year. After she had a hysterectomy in 1992 her headaches significant improved. Patient was never officially diagnosed with migraine headaches but suspected that she had migraines due to her sister's history of migraines and her paternal grandmother's history of migraines. Patient never tried prescription migraine medication.   06/15/2017 patient had cold sensation and chills. She then had headache which continued until the next day. This reminded her of her previous headaches earlier in life. Headache recurred another day later and lasted for 5 weeks continuously. She had 2 episodes of visual aura. She then had headaches from August 20 until the 30th. She had another headache from September 2 of September 5. No nausea vomiting. No photophobia or phonophobia. She has had off-balance sensation. She  has had left-sided headaches ranging from 3 out of 10 in severity. She has tried Midrin and Fioricet without relief. She is tried ibuprofen and Tylenol  without relief.   Initially patient cannot identify any triggering factors. However 1 week prior to onset of headaches she had a tick bite.  Also starting in May 2018 through July 2018, patient had significant dental and oral surgery procedures including 5 oral surgery implants as well as a bone spur removal. She has been left with residual post surgical numbness in her right lower lip and chin.   Also in the end of July 2018 patient had sudden onset of hearing loss in her right ear. No other associated symptoms. Patient went to ENT for evaluation. Patient was treated empirically with prednisone , acyclovir and doxycycline . Her hearing loss has significantly improved.     REVIEW OF SYSTEMS: Full 14 system review of systems performed and negative with exception of: As per HPI.  ALLERGIES: Allergies  Allergen Reactions   Brilinta  [Ticagrelor ] Shortness Of Breath   Bextra [Valdecoxib] Other (See Comments)    Increased lft's    Hydrocod Poli-Chlorphe Poli Er Rash   Lipitor  [Atorvastatin ] Other (See Comments)    Leg weakness    Mevacor [Lovastatin] Other (See Comments)    Muscle weakness    Plavix  [Clopidogrel  Bisulfate] Itching and Other (See Comments)    Hands and feet tingle and itch   Zocor [Simvastatin] Other (See Comments)    Bones hurt    Doxycycline  Diarrhea and Nausea And Vomiting   Metoprolol  Other (See Comments)    Hair loss   Morphine  And Codeine  Itching and Nausea Only   Codeine  Itching and Rash    Hyper-active   Lisinopril Cough    HOME MEDICATIONS: Outpatient Medications Prior to Visit  Medication Sig Dispense Refill   Accu-Chek FastClix Lancets MISC Check blood sugar once daily PRN 100 each 1   albuterol  (PROAIR  HFA) 108 (90 Base) MCG/ACT inhaler Inhale 2 puffs into the lungs every 6 (six) hours as needed for wheezing or shortness of breath. 1 each 11   albuterol  (PROVENTIL ) (2.5 MG/3ML) 0.083% nebulizer solution Take 3 mLs (2.5 mg total) by nebulization every 6 (six) hours as needed for wheezing or shortness of breath. 360 mL 5   ALPRAZolam  (XANAX ) 0.25 MG tablet Take 1 tablet (0.25 mg total) by mouth  at bedtime as needed for sleep. 90 tablet 3   aspirin  EC 81 MG tablet Take 1 tablet (81 mg total) by mouth daily. 90 tablet 3   atorvastatin  (LIPITOR ) 80 MG tablet TAKE 1 TABLET BY MOUTH EVERY DAY 90 tablet 3   azithromycin  (ZITHROMAX ) 250 MG tablet Take 1 tablet (250 mg total) by mouth 3 (three) times a week. Monday Wednesday Friday 30 tablet 6   B Complex-Biotin-FA (B-100 COMPLEX PO) Take 1 tablet by mouth every evening.     bismuth subsalicylate (PEPTO BISMOL) 262 MG/15ML suspension Take 30 mLs by mouth every 6 (six) hours as needed for indigestion or diarrhea or loose stools.     Budeson-Glycopyrrol-Formoterol  (BREZTRI  AEROSPHERE) 160-9-4.8 MCG/ACT AERO Inhale 2 puffs into the lungs in the morning and at bedtime. 3 each 3   cetirizine (ZYRTEC) 10 MG tablet Take 10 mg by mouth daily as needed for allergies.     cholecalciferol  (VITAMIN D3) 25 MCG (1000 UNIT) tablet Take 1,000 Units by mouth every evening.     CORDRAN  4 MCG/SQCM TAPE Apply 1 each topically daily as needed (Rash).  cycloSPORINE  (RESTASIS ) 0.05 % ophthalmic emulsion Place 1 drop into both eyes 2 (two) times daily.      Ergocalciferol  50 MCG (2000 UT) CAPS Take by mouth.     ezetimibe  (ZETIA ) 10 MG tablet TAKE 1 TABLET BY MOUTH EVERY DAY 90 tablet 3   FLUZONE HIGH-DOSE 0.5 ML injection      gabapentin  (NEURONTIN ) 300 MG capsule Take 1 capsule (300 mg total) by mouth 2 (two) times daily. 270 capsule 3   glucose blood (ACCU-CHEK SMARTVIEW) test strip CHECK BLOOD SUGAR ONCE DAILY AS NEEDED 100 strip 1   isosorbide  mononitrate (IMDUR ) 30 MG 24 hr tablet TAKE 3 TABLETS ( 90 MG ) EVERY MORNING AND 1 TABLET (30 MG ) EVERY EVENING 360 tablet 1   losartan  (COZAAR ) 100 MG tablet TAKE 1 TABLET BY MOUTH EVERY DAY 90 tablet 3   metoprolol  succinate (TOPROL -XL) 25 MG 24 hr tablet TAKE 1 TABLET (25 MG TOTAL) BY MOUTH DAILY. 90 tablet 2   Multiple Vitamins-Minerals (MULTIVITAMIN WITH MINERALS) tablet Take 1 tablet by mouth every evening.      nitroGLYCERIN  (NITROSTAT ) 0.4 MG SL tablet Place 1 tablet (0.4 mg total) under the tongue every 5 (five) minutes as needed for chest pain. 25 tablet 11   omeprazole  (PRILOSEC) 40 MG capsule TAKE 1 CAPSULE (40 MG TOTAL) BY MOUTH IN THE MORNING AND AT BEDTIME. 180 capsule 0   ondansetron  (ZOFRAN ) 8 MG tablet Take 1 tablet (8 mg total) by mouth every 6 (six) hours as needed for nausea or vomiting. 20 tablet 0   oxyCODONE -acetaminophen  (PERCOCET) 10-325 MG tablet Take 1 tablet by mouth in the morning and at bedtime. Can take one tablet three times a day as needed for back pain     polyethylene glycol (MIRALAX  / GLYCOLAX ) 17 g packet Take 17 g by mouth daily as needed for moderate constipation.     prasugrel  (EFFIENT ) 5 MG TABS tablet TAKE 1 TABLET (5 MG TOTAL) BY MOUTH DAILY. 90 tablet 3   PRESCRIPTION MEDICATION Place 2 puffs into both ears daily as needed (ear infections). [facility] ear powder     Simethicone  (GAS-X PO) Take 1-2 tablets by mouth daily as needed (gas).     SPIKEVAX syringe      torsemide  (DEMADEX ) 20 MG tablet Take 1 tablet (20 mg total) by mouth daily. Take 1/2 tablet daily as needed for swelling 90 tablet 3   tretinoin  (RETIN-A ) 0.1 % cream Apply 1 application. topically every evening.     amLODipine  (NORVASC ) 5 MG tablet Take 1/2 tablet ( 2.5 mg ) daily (Patient not taking: Reported on 12/14/2023)     celecoxib (CELEBREX) 200 MG capsule Take 200 mg by mouth daily as needed. (Patient not taking: Reported on 12/14/2023)     HYDROcodone  bit-homatropine (HYCODAN) 5-1.5 MG/5ML syrup Take 5 mLs by mouth every 6 (six) hours as needed for cough. (Patient not taking: Reported on 12/14/2023) 240 mL 0   No facility-administered medications prior to visit.    PAST MEDICAL HISTORY: Past Medical History:  Diagnosis Date   AAA (abdominal aortic aneurysm) (HCC)    a. s/p stent grafting in 2009.   Adenomatous colon polyp 05/2001   Adenomatous duodenal polyp    Allergy    Anginal pain (HCC)    COPD  (chronic obstructive pulmonary disease) (HCC)    pt denies COPD   Coronary artery disease    a. s/p CABG in 1995;  b. 05/2013 Neg MV, EF 82%;  c. 06/2013  Cath: LM nl, LAD 40-50p, LCX nl, RCA 80p/141m, VG->RCA->PDA->PLSA min irregs, LIMA->LAD atretic, vigorous LV fxn.   Diabetes mellitus without complication (HCC)    history of, resolved. 2017   Diverticulosis    Eczema, dyshidrotic    Eczema, dyshidrotic 1986   Dr. Prentice Mayhew   Emphysema of lung [facility])    Fatty liver    Fibromyalgia    GERD (gastroesophageal reflux disease)    Hyperlipidemia    Hypertension    Migraine    Myocardial infarction [facility])    Osteoarthritis    Osteoporosis    unsure, having a bone scan soon   Peripheral arterial disease (HCC)    left leg diagnosed by Dr Dominick   Renal artery stenosis [facility])    a. 2008 s/p PTA    PAST SURGICAL HISTORY: Past Surgical History:  Procedure Laterality Date   ABDOMINAL AORTAGRAM N/A 03/06/2014   Procedure: ABDOMINAL AORTAGRAM;  Surgeon: Krystal JULIANNA Doing, MD;  Location: [facility] CATH LAB;  Service: Cardiovascular;  Laterality: N/A;   ABDOMINAL AORTIC ANEURYSM REPAIR  2009   stenting   ABDOMINAL HYSTERECTOMY     Total, 1992   BREAST BIOPSY     CARDIAC CATHETERIZATION     CARDIOVASCULAR STRESS TEST  11/11/2009   normal study   CARPAL TUNNEL RELEASE  08/1993   left wrist   CARPAL TUNNEL RELEASE  01/1997   right   CATARACT EXTRACTION, BILATERAL  2021   CORONARY ARTERY BYPASS GRAFT  07/1994   Triple by Dr Katherene Jude   CORONARY STENT INTERVENTION N/A 02/02/2021   Procedure: CORONARY STENT INTERVENTION;  Surgeon: Mady Bruckner, MD;  Location: [facility] INVASIVE CV LAB;  Service: Cardiovascular;  Laterality: N/A;   CORONARY STENT INTERVENTION N/A 06/26/2021   Procedure: CORONARY STENT INTERVENTION;  Surgeon: Wonda Sharper, MD;  Location: [facility] INVASIVE CV LAB;  Service: Cardiovascular;  Laterality: N/A;   CORONARY ULTRASOUND/IVUS N/A 06/26/2021   Procedure: Intravascular Ultrasound/IVUS;   Surgeon: Wonda Sharper, MD;  Location: [facility] INVASIVE CV LAB;  Service: Cardiovascular;  Laterality: N/A;   CORONARY ULTRASOUND/IVUS N/A 09/03/2021   Procedure: Intravascular Ultrasound/IVUS;  Surgeon: Wendel Lurena POUR, MD;  Location: [facility] INVASIVE CV LAB;  Service: Cardiovascular;  Laterality: N/A;   dental implants     EYE SURGERY  03/2020   cataract   HAND SURGERY Right 09/2002   Right hand pulley release   LAMINECTOMY WITH POSTERIOR LATERAL ARTHRODESIS LEVEL 1 N/A 03/24/2022   Procedure: LAMINECTOMY, FORAMINOTOMY, LUMBAR TWO- THREE ; INTERTRANSVERSE FUSION LUMBAR TWO - LUMBAR THREE RIGHT;  Surgeon: Joshua Alm RAMAN, MD;  Location[facility] OR;  Service: Neurosurgery;  Laterality: N/A;   LEFT HEART CATH AND CORONARY ANGIOGRAPHY N/A 06/26/2021   Procedure: LEFT HEART CATH AND CORONARY ANGIOGRAPHY;  Surgeon: Wonda Sharper, MD;  Location: [facility] INVASIVE CV LAB;  Service: Cardiovascular;  Laterality: N/A;   LEFT HEART CATH AND CORS/GRAFTS ANGIOGRAPHY N/A 02/02/2021   Procedure: LEFT HEART CATH AND CORS/GRAFTS ANGIOGRAPHY;  Surgeon: Mady Bruckner, MD;  Location: [facility] INVASIVE CV LAB;  Service: Cardiovascular;  Laterality: N/A;   LEFT HEART CATH AND CORS/GRAFTS ANGIOGRAPHY N/A 09/03/2021   Procedure: LEFT HEART CATH AND CORS/GRAFTS ANGIOGRAPHY;  Surgeon: Wendel Lurena POUR, MD;  Location: [facility] INVASIVE CV LAB;  Service: Cardiovascular;  Laterality: N/A;   LEFT HEART CATH AND CORS/GRAFTS ANGIOGRAPHY N/A 07/15/2022   Procedure: LEFT HEART CATH AND CORS/GRAFTS ANGIOGRAPHY;  Surgeon: Jordan, Peter M, MD;  [facility] INV[facility] CV LAB;  Service: Cardiovascular;  Laterality: N/A;  LEFT HEART CATHETERIZATION WITH CORONARY/GRAFT ANGIOGRAM N/A 06/11/2013   Procedure: LEFT HEART CATHETERIZATION WITH EL BILE;  Surgeon: Aleene JINNY Passe, MD;  Location: [facility] CATH LAB;  Service: Cardiovascular;  Laterality: N/A;   MULTIPLE TOOTH EXTRACTIONS  2000   All upper teeth.  Has full dneture.    RENAL ARTERY STENT  2008    SHOULDER ARTHROSCOPY WITH ROTATOR CUFF REPAIR Right 09/27/2018   Procedure: Right shoulder mini open rotator cuff repair;  Surgeon: Duwayne Purchase, MD;  Location: [facility] ORS;  Service: Orthopedics;  Laterality: Right;  90 mins   thyroid  cyst apiration  05/1997 and 07/1998   TONSILECTOMY, ADENOIDECTOMY, BILATERAL MYRINGOTOMY AND TUBES  1989   TONSILLECTOMY     TOTAL ABDOMINAL HYSTERECTOMY  1992    FAMILY HISTORY: Family History  Problem Relation Age of Onset   Heart disease Mother    Heart attack Mother    Heart disease Father        before age 24   Heart attack Father    Heart disease Sister        before age 73   Diabetes Sister    Hyperlipidemia Sister    Hypertension Sister    Heart attack Sister    Liver cancer Sister    Breast cancer Sister    Heart disease Brother    Diabetes Brother    Hyperlipidemia Brother    Hypertension Brother    Heart attack Brother    AAA (abdominal aortic aneurysm) Brother    Colon cancer Paternal Grandmother    Heart disease Brother    Colon cancer Cousin        first cousin on father's side   Pancreatic cancer Neg Hx    Rectal cancer Neg Hx    Stomach cancer Neg Hx    Esophageal cancer Neg Hx     SOCIAL HISTORY: Social History   Socioeconomic History   Marital status: Married    Spouse name: Not on file   Number of children: 0   Years of education: Not on file   Highest education level: Bachelor's degree (e.g., BA, AB, BS)  Occupational History   Not on file  Tobacco Use   Smoking status: Former    Current packs/day: 0.00    Types: Cigarettes    Quit date: 09/05/1996    Years since quitting: 27.2    Passive exposure: Never   Smokeless tobacco: Never  Vaping Use   Vaping status: Never Used  Substance and Sexual Activity   Alcohol use: Yes    Alcohol/week: 6.0 - 10.0 standard drinks of alcohol    Types: 6 - 10 Glasses of wine per week    Comment: 2-3 glasses daily   Drug use: No   Sexual activity: Not Currently    Partners:  Male  Other Topics Concern   Not on file  Social History Narrative   Lives at home . Pt is retired.  Education 4 yrs of college.  No children.      Social Drivers of Corporate Investment Banker Strain: Low Risk  (06/13/2020)   Overall Financial Resource Strain (CARDIA)    Difficulty of Paying Living Expenses: Not hard at all  Food Insecurity: No Food Insecurity (07/28/2022)   Hunger Vital Sign    Worried About Running Out of Food in the Last Year: Never true    Ran Out of Food in the Last Year: Never true  Transportation Needs: No Transportation Needs (07/28/2022)   PRAPARE -  Administrator, Civil Service (Medical): No    Lack of Transportation (Non-Medical): No  Physical Activity: Insufficiently Active (06/13/2020)   Exercise Vital Sign    Days of Exercise per Week: 4 days    Minutes of Exercise per Session: 10 min  Stress: No Stress Concern Present (06/13/2020)   [employer] of Occupational Health - Occupational Stress Questionnaire    Feeling of Stress : Only a little  Social Connections: Moderately Isolated (06/13/2020)   Social Connection and Isolation Panel [NHANES]    Frequency of Communication with Friends and Family: More than three times a week    Frequency of Social Gatherings with Friends and Family: Three times a week    Attends Religious Services: Never    Active Member of Clubs or Organizations: No    Attends Banker Meetings: Never    Marital Status: Married  Catering Manager Violence: Not At Risk (06/13/2020)   Humiliation, Afraid, Rape, and Kick questionnaire    Fear of Current or Ex-Partner: No    Emotionally Abused: No    Physically Abused: No    Sexually Abused: No     PHYSICAL EXAM  GENERAL EXAM/CONSTITUTIONAL: Vitals:  Vitals:   12/14/23 1100  BP: (!) 141/65  Pulse: 61  Weight: 128 lb (58.1 kg)  Height: 4' 11 (1.499 m)   Body mass index is 25.85 kg/m. Wt Readings from Last 3 Encounters:  12/14/23 128 lb (58.1 kg)   11/21/23 134 lb 6.4 oz (61 kg)  11/07/23 130 lb (59 kg)   Patient is in no distress; well developed, nourished and groomed; neck is supple  CARDIOVASCULAR: Examination of carotid arteries is normal; no carotid bruits Regular rate and rhythm, no murmurs Examination of peripheral vascular system by observation and palpation is normal  EYES: Ophthalmoscopic exam of optic discs and posterior segments is normal; no papilledema or hemorrhages No results found.  MUSCULOSKELETAL: Gait, strength, tone, movements noted in Neurologic exam below  NEUROLOGIC: MENTAL STATUS:     01/25/2019   10:28 AM  MMSE - Mini Mental State Exam  Orientation to time 5  Orientation to Place 5  Registration 3  Attention/ Calculation 5  Recall 0  Language- name 2 objects 2  Language- repeat 1  Language- follow 3 step command 3  Language- read & follow direction 1  Write a sentence 1  Copy design 1  Total score 27   awake, alert, oriented to person, place and time recent and remote memory intact normal attention and concentration language fluent, comprehension intact, naming intact fund of knowledge appropriate  CRANIAL NERVE:  2nd - no papilledema on fundoscopic exam 2nd, 3rd, 4th, 6th - pupils equal and reactive to light, visual fields full to confrontation, extraocular muscles intact, no nystagmus 5th - facial sensation symmetric 7th - facial strength symmetric 8th - hearing intact 9th - palate elevates symmetrically, uvula midline 11th - shoulder shrug symmetric 12th - tongue protrusion midline  MOTOR:  normal bulk and tone, full strength in the BUE, BLE; EXCEPT DELTOID 4/5 (left shoulder pain), BILATERAL HIP FLEXOR WEAKNESS (3+/5)  SENSORY:  normal and symmetric to light touch, temperature, vibration  COORDINATION:  finger-nose-finger, fine finger movements normal  REFLEXES:  deep tendon reflexes TRACE and symmetric  GAIT/STATION:  narrow based gait; SLOW AND  CAUTIOUS     DIAGNOSTIC DATA (LABS, IMAGING, TESTING) - I reviewed patient records, labs, notes, testing and imaging myself where available.  Lab Results  Component Value Date  WBC 15.0 (H) 02/17/2023   HGB 14.4 02/17/2023   HCT 41.7 02/17/2023   MCV 89.3 02/17/2023   PLT 259.0 02/17/2023      Component Value Date/Time   NA 136 08/25/2023 0000   K 4.1 08/25/2023 0000   CL 94 (L) 08/25/2023 0000   CO2 29 08/25/2023 0000   GLUCOSE 113 (H) 08/25/2023 0000   GLUCOSE 119 (H) 02/17/2023 1026   BUN 11 08/25/2023 0000   CREATININE 0.74 08/25/2023 0000   CREATININE 0.71 06/29/2016 1057   CALCIUM  10.0 08/25/2023 0000   PROT 6.1 08/25/2023 0000   ALBUMIN 4.0 08/25/2023 0000   AST 20 08/25/2023 0000   ALT 23 08/25/2023 0000   ALKPHOS 73 08/25/2023 0000   BILITOT 0.6 08/25/2023 0000   GFRNONAA >60 07/11/2022 1912   GFRAA 89 01/01/2020 0835   Lab Results  Component Value Date   CHOL 159 08/25/2023   HDL 61 08/25/2023   LDLCALC 53 08/25/2023   LDLDIRECT 82.0 06/13/2020   TRIG 293 08/25/2023   CHOLHDL 2.6 08/25/2023   Lab Results  Component Value Date   HGBA1C 6.3 (H) 08/25/2023   Lab Results  Component Value Date   VITAMINB12 1,262 (H) 01/09/2021   Lab Results  Component Value Date   TSH 0.97 11/07/2023    09/04/20 MRI lumbar spine [I reviewed images myself and agree with interpretation. -VRP]  - interval fusion L3-L5 - L3 compression fracture - moderate spinal stenosis at L2-3 - moderate biforaminal stenosis L2-3, L4-5  09/30/20 MRI brain - Stable compared to 2018.  No specific explanation for symptoms.  09/30/20  MRI cervical spine - Mild degenerative spinal stenosis at C5-6 without cord compression or signal abnormality to explain altered gait.  01/07/21 MRI brain [I reviewed images myself and agree with interpretation. -VRP]  1. No acute intracranial abnormality. 2. 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 3. Unchanged pericallosal  lipoma.    ASSESSMENT AND PLAN  78 y.o. year old female here with history of severe spinal stenosis at L4-5 status post decompression and fusion from L3-L5 levels.  Now with worsening spinal stenosis at L2-3 level.  Gait difficulty likely related to hip flexor weakness associated with L2-3 spinal stenosis.  Dx:  1. Weakness     PLAN:  PROXIMAL GENERALIZED WEAKNESS (since June 2024) - check myopathy and MG labs - refer to PT, OT evaluation  GAIT DIFFICULTY / HIP FLEXOR WEAKNESS / LOW BACK PAIN (due lumbar spinal stenosis) - encouraged to restart physical therapy  INTERMITTENT VERTIGO, DIZZINESS (likely benign positional vertigo vs post-infx labyrinthitis) - not much symptoms at home currently; some symptoms during PT sessions  UPPER AND LOWER LIP NUMBNESS (may be related to denture fitting) - gabapentin  300mg  twice a day   MIGRAINE WITH AURA (resolved) - use tylenol  / ibuprofen as needed for breakthrough headache  Orders Placed This Encounter  Procedures   CK   Aldolase   AChR Abs with Reflex to MuSK   Ambulatory referral to Physical Therapy   Ambulatory referral to Occupational Therapy   Return for pending if symptoms worsen or fail to improve, pending test results.    EDUARD FABIENE HANLON, MD 12/14/2023, 11:45 AM Certified in Neurology, Neurophysiology and Neuroimaging  [facility] [address], [address] [place], [place] [address] [phone]

## 2023-12-14 NOTE — Addendum Note (Signed)
 Addended by: Judi Cong on: 12/14/2023 05:18 PM   Modules accepted: Orders

## 2023-12-15 ENCOUNTER — Telehealth: Payer: Self-pay | Admitting: Diagnostic Neuroimaging

## 2023-12-15 NOTE — Telephone Encounter (Signed)
Referral for physical therapy fax to [facility].Phone: [phone], Fax: [phone]

## 2023-12-15 NOTE — Telephone Encounter (Signed)
 Referral for occupational therapy faxed to [facility]. Phone: [phone], Fax: [phone]

## 2023-12-17 ENCOUNTER — Other Ambulatory Visit: Payer: Self-pay | Admitting: Cardiology

## 2023-12-23 ENCOUNTER — Other Ambulatory Visit (HOSPITAL_BASED_OUTPATIENT_CLINIC_OR_DEPARTMENT_OTHER): Payer: Self-pay | Admitting: Cardiology

## 2023-12-23 DIAGNOSIS — I25118 Atherosclerotic heart disease of native coronary artery with other forms of angina pectoris: Secondary | ICD-10-CM

## 2023-12-25 NOTE — Progress Notes (Unsigned)
[facility] Return Visit   Referring Provider Willow Ora, MD [address] [place],  [place] [address]  Primary Care Provider Willow Ora, MD  Patient Profile: Megan [name] is a 78 y.o. female who returns to the [facility] for follow-up of the problem(s) noted below.  Problem List: GERD Constipation, mild intermittent left-sided abdominal pain History of diverticulitis Personal history of adenomatous colon polyps, no longer in surveillance   History of Present Illness   [name] was last seen in the GI office 07/05/2022 by Dr. Russella Dar   Current GI Meds  Omeprazole 40 mg orally daily MiraLAX  Interval History    Last colonoscopy: *** Last endoscopy: ***  Last Abd CT/CTE/MRE: ***  GI Review of Symptoms Significant for {GIROS:50592}. Otherwise negative.  General Review of Systems  Review of systems is significant for the pertinent positives and negatives as listed per the HPI.  Full ROS is otherwise negative.  Past Medical History   Past Medical History:  Diagnosis Date   AAA (abdominal aortic aneurysm) (HCC)    a. s/p stent grafting in 2009.   Adenomatous colon polyp 05/2001   Adenomatous duodenal polyp    Allergy    Anginal pain (HCC)    COPD (chronic obstructive pulmonary disease) (HCC)    pt denies COPD   Coronary artery disease    a. s/p CABG in 1995;  b. 05/2013 Neg MV, EF 82%;  c. 06/2013 Cath: LM nl, LAD 40-50p, LCX nl, RCA 80p/158m, VG->RCA->PDA->PLSA min irregs, LIMA->LAD atretic, vigorous LV fxn.   Diabetes mellitus without complication (HCC)    history of, resolved. 2017   Diverticulosis    Eczema, dyshidrotic    Eczema, dyshidrotic 1986   Dr. Gerilyn Nestle   Emphysema of lung [facility])    Fatty liver    Fibromyalgia    GERD (gastroesophageal reflux disease)    Hyperlipidemia    Hypertension    Migraine    Myocardial infarction [facility])    Osteoarthritis    Osteoporosis    unsure, having a bone scan  soon   Peripheral arterial disease (HCC)    left leg diagnosed by Dr Patty Sermons   Renal artery stenosis [facility])    a. 2008 s/p PTA     Past Surgical History   Past Surgical History:  Procedure Laterality Date   ABDOMINAL AORTAGRAM N/A 03/06/2014   Procedure: ABDOMINAL AORTAGRAM;  Surgeon: Larina Earthly, MD;  Location: [facility] CATH LAB;  Service: Cardiovascular;  Laterality: N/A;   ABDOMINAL AORTIC ANEURYSM REPAIR  2009   stenting   ABDOMINAL HYSTERECTOMY     Total, 1992   BREAST BIOPSY     CARDIAC CATHETERIZATION     CARDIOVASCULAR STRESS TEST  11/11/2009   normal study   CARPAL TUNNEL RELEASE  08/1993   left wrist   CARPAL TUNNEL RELEASE  01/1997   right   CATARACT EXTRACTION, BILATERAL  2021   CORONARY ARTERY BYPASS GRAFT  07/1994   Triple by Dr Ofilia Neas   CORONARY STENT INTERVENTION N/A 02/02/2021   Procedure: CORONARY STENT INTERVENTION;  Surgeon: Yvonne Kendall, MD;  Location: [facility] INVASIVE CV LAB;  Service: Cardiovascular;  Laterality: N/A;   CORONARY STENT INTERVENTION N/A 06/26/2021   Procedure: CORONARY STENT INTERVENTION;  Surgeon: Tonny Bollman, MD;  Location: [facility] INVASIVE CV LAB;  Service: Cardiovascular;  Laterality: N/A;   CORONARY ULTRASOUND/IVUS N/A 06/26/2021   Procedure: Intravascular Ultrasound/IVUS;  Surgeon: Tonny Bollman, MD;  Location: [facility] INVASIVE CV  LAB;  Service: Cardiovascular;  Laterality: N/A;   CORONARY ULTRASOUND/IVUS N/A 09/03/2021   Procedure: Intravascular Ultrasound/IVUS;  Surgeon: Orbie Pyo, MD;  Location: [facility] INVASIVE CV LAB;  Service: Cardiovascular;  Laterality: N/A;   dental implants     EYE SURGERY  03/2020   cataract   HAND SURGERY Right 09/2002   Right hand pulley release   LAMINECTOMY WITH POSTERIOR LATERAL ARTHRODESIS LEVEL 1 N/A 03/24/2022   Procedure: LAMINECTOMY, FORAMINOTOMY, LUMBAR TWO- THREE ; INTERTRANSVERSE FUSION LUMBAR TWO - LUMBAR THREE RIGHT;  Surgeon: Tia Alert, MD;  Location: [facility] OR;  Service: Neurosurgery;  Laterality:  N/A;   LEFT HEART CATH AND CORONARY ANGIOGRAPHY N/A 06/26/2021   Procedure: LEFT HEART CATH AND CORONARY ANGIOGRAPHY;  Surgeon: Tonny Bollman, MD;  Location: [facility] INVASIVE CV LAB;  Service: Cardiovascular;  Laterality: N/A;   LEFT HEART CATH AND CORS/GRAFTS ANGIOGRAPHY N/A 02/02/2021   Procedure: LEFT HEART CATH AND CORS/GRAFTS ANGIOGRAPHY;  Surgeon: Yvonne Kendall, MD;  Location: [facility] INVASIVE CV LAB;  Service: Cardiovascular;  Laterality: N/A;   LEFT HEART CATH AND CORS/GRAFTS ANGIOGRAPHY N/A 09/03/2021   Procedure: LEFT HEART CATH AND CORS/GRAFTS ANGIOGRAPHY;  Surgeon: Orbie Pyo, MD;  Location: [facility] INVASIVE CV LAB;  Service: Cardiovascular;  Laterality: N/A;   LEFT HEART CATH AND CORS/GRAFTS ANGIOGRAPHY N/A 07/15/2022   Procedure: LEFT HEART CATH AND CORS/GRAFTS ANGIOGRAPHY;  Surgeon: Swaziland, Peter M, MD;  Location: [facility] INVASIVE CV LAB;  Service: Cardiovascular;  Laterality: N/A;   LEFT HEART CATHETERIZATION WITH CORONARY/GRAFT ANGIOGRAM N/A 06/11/2013   Procedure: LEFT HEART CATHETERIZATION WITH Isabel Caprice;  Surgeon: Vesta Mixer, MD;  Location: [facility] CATH LAB;  Service: Cardiovascular;  Laterality: N/A;   MULTIPLE TOOTH EXTRACTIONS  2000   All upper teeth.  Has full dneture.    RENAL ARTERY STENT  2008   SHOULDER ARTHROSCOPY WITH ROTATOR CUFF REPAIR Right 09/27/2018   Procedure: Right shoulder mini open rotator cuff repair;  Surgeon: Jene Every, MD;  Location: [facility] ORS;  Service: Orthopedics;  Laterality: Right;  90 mins   thyroid cyst apiration  05/1997 and 07/1998   TONSILECTOMY, ADENOIDECTOMY, BILATERAL MYRINGOTOMY AND TUBES  1989   TONSILLECTOMY     TOTAL ABDOMINAL HYSTERECTOMY  1992     Allergies and Medications   Allergies  Allergen Reactions   Brilinta [Ticagrelor] Shortness Of Breath   Bextra [Valdecoxib] Other (See Comments)    Increased lft's    Hydrocod Poli-Chlorphe Poli Er Rash   Lipitor [Atorvastatin] Other (See Comments)    Leg weakness    Mevacor  [Lovastatin] Other (See Comments)    Muscle weakness    Plavix [Clopidogrel Bisulfate] Itching and Other (See Comments)    Hands and feet tingle and itch   Zocor [Simvastatin] Other (See Comments)    Bones hurt    Doxycycline Diarrhea and Nausea And Vomiting   Metoprolol Other (See Comments)    Hair loss   Morphine And Codeine Itching and Nausea Only   Codeine Itching and Rash    Hyper-active   Lisinopril Cough    @MEDSTODAY @  Family History   Family History  Problem Relation Age of Onset   Heart disease Mother    Heart attack Mother    Heart disease Father        before age 51   Heart attack Father    Heart disease Sister        before age 63   Diabetes Sister    Hyperlipidemia Sister  Hypertension Sister    Heart attack Sister    Liver cancer Sister    Breast cancer Sister    Heart disease Brother    Diabetes Brother    Hyperlipidemia Brother    Hypertension Brother    Heart attack Brother    AAA (abdominal aortic aneurysm) Brother    Colon cancer Paternal Grandmother    Heart disease Brother    Colon cancer Cousin        first cousin on father's side   Pancreatic cancer Neg Hx    Rectal cancer Neg Hx    Stomach cancer Neg Hx    Esophageal cancer Neg Hx    GI Specific Family History: {gifamhx:50061}   Social History   Social History   Tobacco Use   Smoking status: Former    Current packs/day: 0.00    Types: Cigarettes    Quit date: 09/05/1996    Years since quitting: 27.3    Passive exposure: Never   Smokeless tobacco: Never  Vaping Use   Vaping status: Never Used  Substance Use Topics   Alcohol use: Yes    Alcohol/week: 6.0 - 10.0 standard drinks of alcohol    Types: 6 - 10 Glasses of wine per week    Comment: 2-3 glasses daily   Drug use: No   Alizea reports that she quit smoking about 27 years ago. Her smoking use included cigarettes. She has never been exposed to tobacco smoke. She has never used smokeless tobacco. She reports current  alcohol use of about 6.0 - 10.0 standard drinks of alcohol per week. She reports that she does not use drugs.  Vital Signs and Physical Examination  There were no vitals filed for this visit. There is no height or weight on file to calculate BMI.    General: Well developed, well nourished, no acute distress Head: Normocephalic and atraumatic Eyes: Sclerae anicteric, EOMI Ears: Normal auditory acuity Mouth: No deformities or lesions noted Lungs: Clear throughout to auscultation Heart: Regular rate and rhythm; No murmurs, rubs or bruits Abdomen: Soft, non tender and non distended. No masses, hepatosplenomegaly or hernias noted. Normal Bowel sounds Rectal: Musculoskeletal: Symmetrical with no gross deformities  Pulses:  Normal pulses noted Extremities: No edema or deformities noted Neurological: Alert oriented x 4, grossly nonfocal Psychological:  Alert and cooperative. Normal mood and affect   Review of Data  The following data was reviewed at the time of this encounter:  Laboratory Studies      Latest Ref Rng & Units 02/17/2023   10:26 AM 12/22/2022   11:10 AM 07/11/2022    7:12 PM  CBC  WBC 4.0 - 10.5 K/uL 15.0  10.5  9.8   Hemoglobin 12.0 - 15.0 g/dL 81.1  91.4  78.2   Hematocrit 36.0 - 46.0 % 41.7  41.0  40.7   Platelets 150.0 - 400.0 K/uL 259.0  233.0  221     Lab Results  Component Value Date   LIPASE 43 01/06/2021      Latest Ref Rng & Units 08/25/2023   12:00 AM 02/17/2023   10:26 AM 12/22/2022   11:10 AM  CMP  Glucose 70 - 99 mg/dL 956  213  086   BUN mg/dL 11  19  9    Creatinine mg/dL 5.78  4.69  6.29   Sodium 134 - 144 mmol/L 136  138  142   Potassium 3.5 - 5.2 mmol/L 4.1  4.1  3.8   Chloride 96 - 106 mmol/L  94  103  104   CO2 20 - 29 mmol/L 29  25  29    Calcium mg/dL 16.1  9.9  9.6   Total Protein 6.0 - 8.5 g/dL 6.1   6.0   Total Bilirubin 0.0 - 1.2 mg/dL 0.6   0.9   Alkaline Phos 44 - 121 IU/L 73   54   AST 0 - 40 IU/L 20   22   ALT IU/L 23   22       Imaging Studies    GI Procedures and Studies     Clinical Impression  It is my clinical impression that Ms. Arocha is a 78 y.o. female with;  ***  Plan  *** *** *** *** ***   Planned Follow Up No follow-ups on file.  The patient or caregiver verbalized understanding of the material covered, with no barriers to understanding. All questions were answered. Patient or caregiver is agreeable with the plan outlined above.    It was a pleasure to see Freida.  If you have any questions or concerns regarding this evaluation, do not hesitate to contact me.  Maren Beach, MD [facility]

## 2023-12-26 ENCOUNTER — Ambulatory Visit: Payer: Medicare Other | Admitting: Pediatrics

## 2023-12-26 ENCOUNTER — Encounter: Payer: Self-pay | Admitting: Pediatrics

## 2023-12-26 VITALS — BP 110/60 | HR 79 | Ht 59.0 in | Wt 135.0 lb

## 2023-12-26 DIAGNOSIS — Z8719 Personal history of other diseases of the digestive system: Secondary | ICD-10-CM | POA: Diagnosis not present

## 2023-12-26 DIAGNOSIS — Z860101 Personal history of adenomatous and serrated colon polyps: Secondary | ICD-10-CM

## 2023-12-26 DIAGNOSIS — K219 Gastro-esophageal reflux disease without esophagitis: Secondary | ICD-10-CM

## 2023-12-26 DIAGNOSIS — K5900 Constipation, unspecified: Secondary | ICD-10-CM | POA: Diagnosis not present

## 2023-12-26 LAB — CK: Total CK: 60 U/L (ref 32–182)

## 2023-12-26 LAB — MUSK ANTIBODIES: MuSK Antibodies: <1 U/mL

## 2023-12-26 LAB — ALDOLASE: Aldolase: 6.7 U/L (ref 3.3–10.3)

## 2023-12-26 LAB — ACHR ABS WITH REFLEX TO MUSK: AChR Binding Ab, Serum: <0.03 nmol/L (ref 0.00–0.24)

## 2023-12-26 MED ORDER — OMEPRAZOLE 40 MG PO CPDR: 40.0000 mg | DELAYED_RELEASE_CAPSULE | Freq: Every day | ORAL | 3 refills | Status: DC

## 2023-12-26 NOTE — Patient Instructions (Addendum)
_______________________________________________________  If your blood pressure at your visit was 140/90 or greater, please contact your primary care physician to follow up on this. _______________________________________________________  If you are age 80 or older, your body mass index should be between 23-30. Your Body mass index is 27.27 kg/m. If this is out of the aforementioned range listed, please consider follow up with your Primary Care Provider. ________________________________________________________  The  GI providers would like to encourage you to use [facility] to communicate with providers for non-urgent requests or questions.  Due to long hold times on the telephone, sending your provider a message by [facility] may be a faster and more efficient way to get a response.  Please allow 48 business hours for a response.  Please remember that this is for non-urgent requests.  _______________________________________________________  We have sent the following medications to your pharmacy for you to pick up at your convenience:  Omeprazole 40mg  one capsule daily  You will need a follow up appointment in 1 year.  We will contact you to schedule this appointment.  Thank you for entrusting me with your care and choosing [facility].  Dr Doy Hutching

## 2023-12-30 NOTE — Progress Notes (Signed)
Normal labs. -VRP

## 2024-01-13 ENCOUNTER — Ambulatory Visit
Admission: RE | Admit: 2024-01-13 | Discharge: 2024-01-13 | Disposition: A | Discharge status: Home / Self Care | Payer: Medicare Other | Source: Ambulatory Visit | Attending: Family Medicine | Admitting: Family Medicine

## 2024-01-13 DIAGNOSIS — Z1231 Encounter for screening mammogram for malignant neoplasm of breast: Secondary | ICD-10-CM | POA: Diagnosis not present

## 2024-01-16 ENCOUNTER — Encounter: Payer: Self-pay | Admitting: Diagnostic Neuroimaging

## 2024-01-16 DIAGNOSIS — M48061 Spinal stenosis, lumbar region without neurogenic claudication: Secondary | ICD-10-CM | POA: Diagnosis not present

## 2024-01-16 DIAGNOSIS — H814 Vertigo of central origin: Secondary | ICD-10-CM

## 2024-01-16 DIAGNOSIS — R531 Weakness: Secondary | ICD-10-CM

## 2024-01-16 DIAGNOSIS — M797 Fibromyalgia: Secondary | ICD-10-CM | POA: Diagnosis not present

## 2024-01-16 DIAGNOSIS — H5500 Unspecified nystagmus: Secondary | ICD-10-CM

## 2024-01-17 NOTE — Telephone Encounter (Signed)
Pt would like a call back to discuss physical therapy referral.

## 2024-01-18 NOTE — Telephone Encounter (Signed)
I called patient to discuss pt said [facility] told her that a note was sent to the office on 12/26/23 stating they can't accept her as a patient due to the diagnosis. The diagnosis is neuro per [facility].  Pt asked if referral could be placed at Neuro Rehab at this office, referral placed. Patient informed this has been done.

## 2024-01-18 NOTE — Telephone Encounter (Signed)
Left message for patient to call to discuss.

## 2024-01-23 NOTE — Therapy (Signed)
OUTPATIENT PHYSICAL THERAPY NEURO EVALUATION   Patient Name: Megan [name] MRN: [id number] DOB:04-Oct-1946, 78 y.o., female Today's Date: 01/25/2024   PCP: Willow Ora, MD  REFERRING PROVIDER: Suanne Marker, MD  END OF SESSION:  PT End of Session - 01/24/24 1350     Visit Number 1    Number of Visits 13    Date for PT Re-Evaluation 03/24/24    Authorization Type [employer]ICARE    PT Start Time 1103    PT Stop Time 1143    PT Time Calculation (min) 40 min    Equipment Utilized During Treatment Gait belt    Activity Tolerance Patient tolerated treatment well    Behavior During Therapy WFL for tasks assessed/performed             Past Medical History:  Diagnosis Date   AAA (abdominal aortic aneurysm) (HCC)    a. s/p stent grafting in 2009.   Adenomatous colon polyp 05/2001   Adenomatous duodenal polyp    Allergy    Anginal pain (HCC)    COPD (chronic obstructive pulmonary disease) (HCC)    pt denies COPD   Coronary artery disease    a. s/p CABG in 1995;  b. 05/2013 Neg MV, EF 82%;  c. 06/2013 Cath: LM nl, LAD 40-50p, LCX nl, RCA 80p/120m, VG->RCA->PDA->PLSA min irregs, LIMA->LAD atretic, vigorous LV fxn.   Diabetes mellitus without complication (HCC)    history of, resolved. 2017   Diverticulosis    Eczema, dyshidrotic    Eczema, dyshidrotic 1986   Dr. Gerilyn Nestle   Emphysema of lung [facility])    Fatty liver    Fibromyalgia    GERD (gastroesophageal reflux disease)    Hyperlipidemia    Hypertension    Migraine    Myocardial infarction [facility])    Osteoarthritis    Osteoporosis    unsure, having a bone scan soon   Peripheral arterial disease (HCC)    left leg diagnosed by Dr Patty Sermons   Renal artery stenosis [facility])    a. 2008 s/p PTA   Past Surgical History:  Procedure Laterality Date   ABDOMINAL AORTAGRAM N/A 03/06/2014   Procedure: ABDOMINAL AORTAGRAM;  Surgeon: Larina Earthly, MD;  Location: [facility] CATH LAB;  Service: Cardiovascular;  Laterality:  N/A;   ABDOMINAL AORTIC ANEURYSM REPAIR  2009   stenting   ABDOMINAL HYSTERECTOMY     Total, 1992   BREAST BIOPSY     CARDIAC CATHETERIZATION     CARDIOVASCULAR STRESS TEST  11/11/2009   normal study   CARPAL TUNNEL RELEASE  08/1993   left wrist   CARPAL TUNNEL RELEASE  01/1997   right   CATARACT EXTRACTION, BILATERAL  2021   CORONARY ARTERY BYPASS GRAFT  07/1994   Triple by Dr Ofilia Neas   CORONARY STENT INTERVENTION N/A 02/02/2021   Procedure: CORONARY STENT INTERVENTION;  Surgeon: Yvonne Kendall, MD;  Location: [facility] INVASIVE CV LAB;  Service: Cardiovascular;  Laterality: N/A;   CORONARY STENT INTERVENTION N/A 06/26/2021   Procedure: CORONARY STENT INTERVENTION;  Surgeon: Tonny Bollman, MD;  Location: [facility] INVASIVE CV LAB;  Service: Cardiovascular;  Laterality: N/A;   CORONARY ULTRASOUND/IVUS N/A 06/26/2021   Procedure: Intravascular Ultrasound/IVUS;  Surgeon: Tonny Bollman, MD;  Location: [facility] INVASIVE CV LAB;  Service: Cardiovascular;  Laterality: N/A;   CORONARY ULTRASOUND/IVUS N/A 09/03/2021   Procedure: Intravascular Ultrasound/IVUS;  Surgeon: Orbie Pyo, MD;  Location: [facility] INVASIVE CV LAB;  Service: Cardiovascular;  Laterality: N/A;  dental implants     EYE SURGERY  03/2020   cataract   HAND SURGERY Right 09/2002   Right hand pulley release   LAMINECTOMY WITH POSTERIOR LATERAL ARTHRODESIS LEVEL 1 N/A 03/24/2022   Procedure: LAMINECTOMY, FORAMINOTOMY, LUMBAR TWO- THREE ; INTERTRANSVERSE FUSION LUMBAR TWO - LUMBAR THREE RIGHT;  Surgeon: Tia Alert, MD;  Location: [facility] OR;  Service: Neurosurgery;  Laterality: N/A;   LEFT HEART CATH AND CORONARY ANGIOGRAPHY N/A 06/26/2021   Procedure: LEFT HEART CATH AND CORONARY ANGIOGRAPHY;  Surgeon: Tonny Bollman, MD;  Location: [facility] INVASIVE CV LAB;  Service: Cardiovascular;  Laterality: N/A;   LEFT HEART CATH AND CORS/GRAFTS ANGIOGRAPHY N/A 02/02/2021   Procedure: LEFT HEART CATH AND CORS/GRAFTS ANGIOGRAPHY;  Surgeon: Yvonne Kendall, MD;   Location: [facility] INVASIVE CV LAB;  Service: Cardiovascular;  Laterality: N/A;   LEFT HEART CATH AND CORS/GRAFTS ANGIOGRAPHY N/A 09/03/2021   Procedure: LEFT HEART CATH AND CORS/GRAFTS ANGIOGRAPHY;  Surgeon: Orbie Pyo, MD;  Location: [facility] INVASIVE CV LAB;  Service: Cardiovascular;  Laterality: N/A;   LEFT HEART CATH AND CORS/GRAFTS ANGIOGRAPHY N/A 07/15/2022   Procedure: LEFT HEART CATH AND CORS/GRAFTS ANGIOGRAPHY;  Surgeon: Swaziland, Peter M, MD;  Location: [facility] INVASIVE CV LAB;  Service: Cardiovascular;  Laterality: N/A;   LEFT HEART CATHETERIZATION WITH CORONARY/GRAFT ANGIOGRAM N/A 06/11/2013   Procedure: LEFT HEART CATHETERIZATION WITH Isabel Caprice;  Surgeon: Vesta Mixer, MD;  Location: [facility] CATH LAB;  Service: Cardiovascular;  Laterality: N/A;   MULTIPLE TOOTH EXTRACTIONS  2000   All upper teeth.  Has full dneture.    RENAL ARTERY STENT  2008   SHOULDER ARTHROSCOPY WITH ROTATOR CUFF REPAIR Right 09/27/2018   Procedure: Right shoulder mini open rotator cuff repair;  Surgeon: Jene Every, MD;  Location: [facility] ORS;  Service: Orthopedics;  Laterality: Right;  90 mins   thyroid cyst apiration  05/1997 and 07/1998   TONSILECTOMY, ADENOIDECTOMY, BILATERAL MYRINGOTOMY AND TUBES  1989   TONSILLECTOMY     TOTAL ABDOMINAL HYSTERECTOMY  1992   Patient Active Problem List   Diagnosis Date Noted   Oxygen dependent 04/29/2023   Centrilobular emphysema (HCC) 02/17/2023   Chronic respiratory failure (HCC) 02/17/2023   Pneumonia 02/17/2023   Senile purpura (HCC) 12/22/2022   Daily consumption of alcohol 12/22/2022   S/P lumbar laminectomy 03/24/2022   Thyroid nodule greater than or equal to 1.5 cm in diameter incidentally noted on imaging study 08/27/2021   Angina pectoris (HCC) 06/25/2021   History of compression fracture of spine - lumbar 06/19/2021   History of lumbosacral spine surgery 03/03/2021   NSTEMI (non-ST elevated myocardial infarction) (HCC) 2022 01/31/2021   Asymmetric SNHL  (sensorineural hearing loss) 01/26/2021   Neuropathic pain 12/05/2020   Gait instability 09/09/2020   Lumbar stenosis without neurogenic claudication 02/26/2020   COPD with chronic bronchitis (HCC) 09/28/2018   Essential (primary) hypertension 07/10/2018   IFG (impaired fasting glucose) 11/10/2017   DJD (degenerative joint disease), lumbosacral 11/10/2017   Fibromyalgia 11/10/2017   Dyshidrotic eczema 11/10/2017   GERD (gastroesophageal reflux disease) 11/10/2017   Osteopenia 11/10/2017   Does use hearing aid 11/10/2017   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   Fatty liver 11/10/2017   Coronary artery disease    Peripheral vascular disease with claudication (HCC)    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   History of abdominal aortic aneurysm repair 05/18/2011   History of renal artery stenosis 05/18/2011   Hyperlipidemia LDL goal <70     ONSET DATE: 12/14/2023  REFERRING DIAG: R53.1 (ICD-10-CM) -  Weakness H55.00 (ICD-10-CM) - Nystagmus H81.4 (ICD-10-CM) - Central positional vertigo  THERAPY DIAG:  Unsteadiness on feet  Dizziness and giddiness  Other abnormalities of gait and mobility  Muscle weakness (generalized)  BPPV (benign paroxysmal positional vertigo), right  Rationale for Evaluation and Treatment: Rehabilitation  SUBJECTIVE:                                                                                                                                                                                             SUBJECTIVE STATEMENT: Feels like she is losing muscle. Had blood tests to see if it was anything muscle and it wasn't. Coming back to therapy now to work on building up muscles. Reports her last back surgery has left her where her balance is messed up. Wants to build up core muscles. In the morning she is real dizzy (not as bad as it was). Walks everyday on a treadmill for a mile and due to hx of emphysema, does use oxygen, uses 3-4 L.  Feels like she is getting muscles in her  thighs. Has trouble from a weakness perspective putting dishes and lifting 4# sugar bags. Saw the ENT on 03/2023, and found that she had active R posterior canal BPPV and they did not treat it. Still wants to be assessed for BPPV. Does do some furniture walking in the house. Used to carry her cane a few months ago, but now feels like she needs it more. Pt seen here in early 2024, was requested to get an MRI due to central vestibular signs and pt with unremarkable MRI brain   Pt accompanied by: self  PERTINENT HISTORY: PMH: AAA, COPD, CAD, fibromyalgia, HLD, HTN, hx of MI, PAD, lumbar spinal surgery 03/24/22, history of severe spinal stenosis at L4-5 status post decompression and fusion from L3-L5 levels. Now with worsening spinal stenosis at L2-3 level.   Per Dr. Marjory Lies: Gait difficulty likely related to hip flexor weakness associated with L2-3 spinal stenosis.   PAIN:  Are you having pain? Yes, in pain most of the time, has fibromyalgia, on oxycodone   PRECAUTIONS: Fall  Vitals:   01/24/24 1124  BP: (!) 138/54  Pulse: 68     FALLS: Has patient fallen in last 6 months? No  LIVING ENVIRONMENT: Lives with: lives alone, husband in a nursing house  Lives in: House/apartment Stairs: 3rd floor apartment, can use an elevator  Has following equipment at home: Single point cane, Environmental consultant - 4 wheeled, and Grab bars  PLOF: Independent and Independent with community mobility with device  PATIENT GOALS: Wants to work on her strength, re-assess for BPPV, working on  some balance    OBJECTIVE:  Note: Objective measures were completed at Evaluation unless otherwise noted.  DIAGNOSTIC FINDINGS: 02/20/23: IMPRESSION:    Unremarkable MRI brain (with and without).  No acute findings.    COGNITION: Overall cognitive status: Within functional limits for tasks assessed   SENSATION: Light touch: WFL  POSTURE: forward head   LOWER EXTREMITY MMT:    MMT Right Eval Left Eval  Hip flexion 3 3   Hip extension    Hip abduction 4+ 4+  Hip adduction 4+ 4+  Hip internal rotation    Hip external rotation    Knee flexion 4- 4  Knee extension 5 5  Ankle dorsiflexion    Ankle plantarflexion    Ankle inversion    Ankle eversion    (Blank rows = not tested)  All tested in sitting    TRANSFERS: Assistive device utilized: None  Sit to stand: Modified independence Stand to sit: Modified independence    Performs with wide BOS   GAIT: Gait pattern: step through pattern Distance walked: Clinic distances  Assistive device utilized: Single point cane and None Level of assistance: Modified independence and SBA Comments: No overt unsteadiness noted, but some path deviation noted with no AD    FUNCTIONAL TESTS:  5 times sit to stand: 17.3 seconds with no UE support  30 seconds chair stand test: 9 sit <> stands with no UE support, pt reporting 4-5/10 RPE (<10 below normal for age) Gait speed: 12.4 seconds with no AD = 2.65 ft/sec     M-CTSIB  Condition 1: Firm Surface, EO 30 Sec, Normal Sway  Condition 2: Firm Surface, EC 30 Sec, Normal Sway  Condition 3: Foam Surface, EO 30 Sec, Mild Sway  Condition 4: Foam Surface, EC 30 Sec, Mild Sway                                                                                          TREATMENT DATE:  N/A during eval.     PATIENT EDUCATION: Education details: Clinical findings, POC, will perform further vestibular assessment at next session due to hx of R BPPV, Discussed aquatic therapy as a part of POC to work on functional strength and balance. Pt not sure about aquatic therapy at this time as pt does not want to be in a bathing suit and not sure about getting a membership to continue in the pool. Pt to think about aquatic therapy and will decide at a future date  Person educated: Patient Education method: Explanation Education comprehension: verbalized understanding  HOME EXERCISE PROGRAM: Will provide at future session.    GOALS: Goals reviewed with patient? Yes  SHORT TERM GOALS: Target date: 02/15/2024  Pt will be independent with initial HEP for strength/balance in order to build upon functional gains made in therapy. Baseline: Goal status: INITIAL  2.  Further vestibular assessment to be written with LTG updated Baseline:  Goal status: INITIAL  3.  FGA to be assessed with LTG written.  Baseline:  Goal status: INITIAL  4.  Pt will improve 5x sit<>stand to less than or equal to 15 sec to demonstrate improved functional  strength and transfer efficiency.   Baseline: 17.3 seconds with no UE support  Goal status: INITIAL    LONG TERM GOALS: Target date: 03/07/2024  Pt will be independent with final HEP for strength/balance in order to build upon functional gains made in therapy. Baseline:  Goal status: INITIAL  2.  FGA goal to be written as appropriate.  Baseline:  Goal status: INITIAL  3.  Vestibular goal to be written as appropriate.  Baseline:  Goal status: INITIAL  4.  Pt will improve 30 second chair stand to at least 11 sit <> stands in order to demo above norm for age.   Baseline: 9 sit <> stands with no UE support Goal status: INITIAL  5.  Pt will improve gait speed with no AD vs. LRAD to at least 2.9 ft/sec in order to demo improved community mobility.   Baseline: 12.4 seconds with no AD = 2.65 ft/sec  Goal status: INITIAL    ASSESSMENT:  CLINICAL IMPRESSION: Patient is a 78 year old female referred to Neuro OPPT for Vetigo/Muscle Weakness.   Pt's PMH is significant for: AAA, COPD, CAD, fibromyalgia, HLD, HTN, hx of MI, PAD, lumbar spinal surgery 03/24/22, history of severe spinal stenosis at L4-5 status post decompression and fusion from L3-L5 levels. Now with worsening spinal stenosis at L2-3 level. Pt known to this clinic from previous bouts of therapy. Pt last here at beginning of 2024 and went on hold due to central vestibular findings. Pt had MRI of the brain and was  found to be unremarkable. Also saw ENT and pt was found to have R BPPV, but it was not treated.  The following deficits were present during the exam: decr strength, gait abnormalities, impaired balance, incr postural sway with balance with EC. Based on 30 second chair stand, pt below normal for age. Pt's gait speed with no AD, indicating limited community ambulator. Will perform further balance assessment at next session.   Pt would benefit from skilled PT to address these impairments and functional limitations to maximize functional mobility independence   OBJECTIVE IMPAIRMENTS: Abnormal gait, cardiopulmonary status limiting activity, decreased activity tolerance, decreased balance, decreased endurance, decreased mobility, difficulty walking, decreased strength, dizziness, postural dysfunction, and pain.   ACTIVITY LIMITATIONS: transfers and locomotion level  PARTICIPATION LIMITATIONS: community activity  PERSONAL FACTORS: Age, Behavior pattern, Past/current experiences, Time since onset of injury/illness/exacerbation, and 3+ comorbidities: AAA, COPD, CAD, fibromyalgia, HLD, HTN, hx of MI, PAD, lumbar spinal surgery 03/24/22, history of severe spinal stenosis at L4-5 status post decompression and fusion from L3-L5 levels. Now with worsening spinal stenosis at L2-3 level.   are also affecting patient's functional outcome.   REHAB POTENTIAL: Good  CLINICAL DECISION MAKING: Evolving/moderate complexity  EVALUATION COMPLEXITY: Moderate  PLAN:  PT FREQUENCY: 2x/week  PT DURATION: 8 weeks - anticipate 6 weeks   PLANNED INTERVENTIONS: [id number]- PT Re-evaluation, 97110-Therapeutic exercises, 97530- Therapeutic activity, [id number]- Neuromuscular re-education, 97535- Self Care, [id number]- Manual therapy, [id number]- Gait training, [phone]- Canalith repositioning, [phone]- Aquatic Therapy, Patient/Family education, Balance training, Stair training, Vestibular training, and DME instructions  PLAN FOR NEXT SESSION: perform  further vestibular assessment (pt has hx of R BPPV) and assess FGA. Write goals as appropriate. Initiate HEP based on findings from vestibular eval and for functional strength   Did she change her mind about aquatics in the future?    Drake Leach, PT, DPT 01/25/2024, 8:23 AM

## 2024-01-24 ENCOUNTER — Ambulatory Visit: Payer: Medicare Other | Attending: Diagnostic Neuroimaging | Admitting: Physical Therapy

## 2024-01-24 ENCOUNTER — Encounter: Payer: Self-pay | Admitting: Physical Therapy

## 2024-01-24 VITALS — BP 138/54 | HR 68

## 2024-01-24 DIAGNOSIS — R42 Dizziness and giddiness: Secondary | ICD-10-CM | POA: Insufficient documentation

## 2024-01-24 DIAGNOSIS — R2681 Unsteadiness on feet: Secondary | ICD-10-CM | POA: Insufficient documentation

## 2024-01-24 DIAGNOSIS — H8111 Benign paroxysmal vertigo, right ear: Secondary | ICD-10-CM | POA: Diagnosis not present

## 2024-01-24 DIAGNOSIS — R2689 Other abnormalities of gait and mobility: Secondary | ICD-10-CM | POA: Insufficient documentation

## 2024-01-24 DIAGNOSIS — M6281 Muscle weakness (generalized): Secondary | ICD-10-CM | POA: Insufficient documentation

## 2024-01-31 ENCOUNTER — Encounter: Payer: Self-pay | Admitting: Physical Therapy

## 2024-01-31 ENCOUNTER — Ambulatory Visit: Payer: Medicare Other | Admitting: Physical Therapy

## 2024-01-31 DIAGNOSIS — R2681 Unsteadiness on feet: Secondary | ICD-10-CM | POA: Diagnosis not present

## 2024-01-31 DIAGNOSIS — R2689 Other abnormalities of gait and mobility: Secondary | ICD-10-CM | POA: Diagnosis not present

## 2024-01-31 DIAGNOSIS — H8111 Benign paroxysmal vertigo, right ear: Secondary | ICD-10-CM | POA: Diagnosis not present

## 2024-01-31 DIAGNOSIS — M6281 Muscle weakness (generalized): Secondary | ICD-10-CM | POA: Diagnosis not present

## 2024-01-31 DIAGNOSIS — R42 Dizziness and giddiness: Secondary | ICD-10-CM | POA: Diagnosis not present

## 2024-01-31 NOTE — Therapy (Unsigned)
 OUTPATIENT PHYSICAL THERAPY NEURO TREATMENT NOTE   Patient Name: Megan [name] MRN: [id number] DOB:1945/12/11, 78 y.o., female Today's Date: 02/01/2024   PCP: Willow Ora, MD  REFERRING PROVIDER: Suanne Marker, MD  END OF SESSION:  PT End of Session - 02/01/24 1321     Visit Number 2    Number of Visits 13    Date for PT Re-Evaluation 03/24/24    Authorization Type [employer]ICARE    PT Start Time [phone]    PT Stop Time 1016    PT Time Calculation (min) 43 min    Equipment Utilized During Treatment --    Activity Tolerance Patient tolerated treatment well    Behavior During Therapy WFL for tasks assessed/performed              Past Medical History:  Diagnosis Date   AAA (abdominal aortic aneurysm) (HCC)    a. s/p stent grafting in 2009.   Adenomatous colon polyp 05/2001   Adenomatous duodenal polyp    Allergy    Anginal pain (HCC)    COPD (chronic obstructive pulmonary disease) (HCC)    pt denies COPD   Coronary artery disease    a. s/p CABG in 1995;  b. 05/2013 Neg MV, EF 82%;  c. 06/2013 Cath: LM nl, LAD 40-50p, LCX nl, RCA 80p/153m, VG->RCA->PDA->PLSA min irregs, LIMA->LAD atretic, vigorous LV fxn.   Diabetes mellitus without complication (HCC)    history of, resolved. 2017   Diverticulosis    Eczema, dyshidrotic    Eczema, dyshidrotic 1986   Dr. Gerilyn Nestle   Emphysema of lung [facility])    Fatty liver    Fibromyalgia    GERD (gastroesophageal reflux disease)    Hyperlipidemia    Hypertension    Migraine    Myocardial infarction [facility])    Osteoarthritis    Osteoporosis    unsure, having a bone scan soon   Peripheral arterial disease (HCC)    left leg diagnosed by Dr Patty Sermons   Renal artery stenosis [facility])    a. 2008 s/p PTA   Past Surgical History:  Procedure Laterality Date   ABDOMINAL AORTAGRAM N/A 03/06/2014   Procedure: ABDOMINAL AORTAGRAM;  Surgeon: Larina Earthly, MD;  Location: [facility] CATH LAB;  Service: Cardiovascular;  Laterality:  N/A;   ABDOMINAL AORTIC ANEURYSM REPAIR  2009   stenting   ABDOMINAL HYSTERECTOMY     Total, 1992   BREAST BIOPSY     CARDIAC CATHETERIZATION     CARDIOVASCULAR STRESS TEST  11/11/2009   normal study   CARPAL TUNNEL RELEASE  08/1993   left wrist   CARPAL TUNNEL RELEASE  01/1997   right   CATARACT EXTRACTION, BILATERAL  2021   CORONARY ARTERY BYPASS GRAFT  07/1994   Triple by Dr Ofilia Neas   CORONARY STENT INTERVENTION N/A 02/02/2021   Procedure: CORONARY STENT INTERVENTION;  Surgeon: Yvonne Kendall, MD;  Location: [facility] INVASIVE CV LAB;  Service: Cardiovascular;  Laterality: N/A;   CORONARY STENT INTERVENTION N/A 06/26/2021   Procedure: CORONARY STENT INTERVENTION;  Surgeon: Tonny Bollman, MD;  Location: [facility] INVASIVE CV LAB;  Service: Cardiovascular;  Laterality: N/A;   CORONARY ULTRASOUND/IVUS N/A 06/26/2021   Procedure: Intravascular Ultrasound/IVUS;  Surgeon: Tonny Bollman, MD;  Location: [facility] INVASIVE CV LAB;  Service: Cardiovascular;  Laterality: N/A;   CORONARY ULTRASOUND/IVUS N/A 09/03/2021   Procedure: Intravascular Ultrasound/IVUS;  Surgeon: Orbie Pyo, MD;  Location: [facility] INVASIVE CV LAB;  Service: Cardiovascular;  Laterality: N/A;  dental implants     EYE SURGERY  03/2020   cataract   HAND SURGERY Right 09/2002   Right hand pulley release   LAMINECTOMY WITH POSTERIOR LATERAL ARTHRODESIS LEVEL 1 N/A 03/24/2022   Procedure: LAMINECTOMY, FORAMINOTOMY, LUMBAR TWO- THREE ; INTERTRANSVERSE FUSION LUMBAR TWO - LUMBAR THREE RIGHT;  Surgeon: Tia Alert, MD;  Location: [facility] OR;  Service: Neurosurgery;  Laterality: N/A;   LEFT HEART CATH AND CORONARY ANGIOGRAPHY N/A 06/26/2021   Procedure: LEFT HEART CATH AND CORONARY ANGIOGRAPHY;  Surgeon: Tonny Bollman, MD;  Location: [facility] INVASIVE CV LAB;  Service: Cardiovascular;  Laterality: N/A;   LEFT HEART CATH AND CORS/GRAFTS ANGIOGRAPHY N/A 02/02/2021   Procedure: LEFT HEART CATH AND CORS/GRAFTS ANGIOGRAPHY;  Surgeon: Yvonne Kendall, MD;   Location: [facility] INVASIVE CV LAB;  Service: Cardiovascular;  Laterality: N/A;   LEFT HEART CATH AND CORS/GRAFTS ANGIOGRAPHY N/A 09/03/2021   Procedure: LEFT HEART CATH AND CORS/GRAFTS ANGIOGRAPHY;  Surgeon: Orbie Pyo, MD;  Location: [facility] INVASIVE CV LAB;  Service: Cardiovascular;  Laterality: N/A;   LEFT HEART CATH AND CORS/GRAFTS ANGIOGRAPHY N/A 07/15/2022   Procedure: LEFT HEART CATH AND CORS/GRAFTS ANGIOGRAPHY;  Surgeon: Swazil[place], Peter M, MD;  Location: [facility] INVASIVE CV LAB;  Service: Cardiovascular;  Laterality: N/A;   LEFT HEART CATHETERIZATION WITH CORONARY/GRAFT ANGIOGRAM N/A 06/11/2013   Procedure: LEFT HEART CATHETERIZATION WITH Isabel Caprice;  Surgeon: Vesta Mixer, MD;  Location: [facility] CATH LAB;  Service: Cardiovascular;  Laterality: N/A;   MULTIPLE TOOTH EXTRACTIONS  2000   All upper teeth.  Has full dneture.    RENAL ARTERY STENT  2008   SHOULDER ARTHROSCOPY WITH ROTATOR CUFF REPAIR Right 09/27/2018   Procedure: Right shoulder mini open rotator cuff repair;  Surgeon: Jene Every, MD;  Location: [facility] ORS;  Service: Orthopedics;  Laterality: Right;  90 mins   thyroid cyst apiration  05/1997 and 07/1998   TONSILECTOMY, ADENOIDECTOMY, BILATERAL MYRINGOTOMY AND TUBES  1989   TONSILLECTOMY     TOTAL ABDOMINAL HYSTERECTOMY  1992   Patient Active Problem List   Diagnosis Date Noted   Oxygen dependent 04/29/2023   Centrilobular emphysema (HCC) 02/17/2023   Chronic respiratory failure (HCC) 02/17/2023   Pneumonia 02/17/2023   Senile purpura (HCC) 12/22/2022   Daily consumption of alcohol 12/22/2022   S/P lumbar laminectomy 03/24/2022   Thyroid nodule greater than or equal to 1.5 cm in diameter incidentally noted on imaging study 08/27/2021   Angina pectoris (HCC) 06/25/2021   History of compression fracture of spine - lumbar 06/19/2021   History of lumbosacral spine surgery 03/03/2021   NSTEMI (non-ST elevated myocardial infarction) (HCC) 2022 01/31/2021   Asymmetric SNHL  (sensorineural hearing loss) 01/26/2021   Neuropathic pain 12/05/2020   Gait instability 09/09/2020   Lumbar stenosis without neurogenic claudication 02/26/2020   COPD with chronic bronchitis (HCC) 09/28/2018   Essential (primary) hypertension 07/10/2018   IFG (impaired fasting glucose) 11/10/2017   DJD (degenerative joint disease), lumbosacral 11/10/2017   Fibromyalgia 11/10/2017   Dyshidrotic eczema 11/10/2017   GERD (gastroesophageal reflux disease) 11/10/2017   Osteopenia 11/10/2017   Does use hearing aid 11/10/2017   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   Fatty liver 11/10/2017   Coronary artery disease    Peripheral vascular disease with claudication (HCC)    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   History of abdominal aortic aneurysm repair 05/18/2011   History of renal artery stenosis 05/18/2011   Hyperlipidemia LDL goal <70     ONSET DATE: 12/14/2023  REFERRING DIAG: R53.1 (ICD-10-CM) -  Weakness H55.00 (ICD-10-CM) - Nystagmus H81.4 (ICD-10-CM) - Central positional vertigo  THERAPY DIAG:  Dizziness and giddiness  Muscle weakness (generalized)  Rationale for Evaluation and Treatment: Rehabilitation  SUBJECTIVE:                                                                                                                                                                                             SUBJECTIVE STATEMENT: Pt reports no new problems or changes since initial eval last week; requests that she be tested for BPPV as she reports she does have some dizziness but it really isn't room spinning vertigo  Pt accompanied by: self  PERTINENT HISTORY: PMH: AAA, COPD, CAD, fibromyalgia, HLD, HTN, hx of MI, PAD, lumbar spinal surgery 03/24/22, history of severe spinal stenosis at L4-5 status post decompression and fusion from L3-L5 levels. Now with worsening spinal stenosis at L2-3 level.   Per Dr. Marjory Lies: Gait difficulty likely related to hip flexor weakness associated with L2-3  spinal stenosis.   PAIN:  Are you having pain? Yes, in pain most of the time, has fibromyalgia, on oxycodone   PRECAUTIONS: Fall  There were no vitals filed for this visit.    FALLS: Has patient fallen in last 6 months? No  LIVING ENVIRONMENT: Lives with: lives alone, husband in a nursing house  Lives in: House/apartment Stairs: 3rd floor apartment, can use an elevator  Has following equipment at home: Single point cane, Environmental consultant - 4 wheeled, and Grab bars  PLOF: Independent and Independent with community mobility with device  PATIENT GOALS: Wants to work on her strength, re-assess for BPPV, working on some balance    OBJECTIVE:  Note: Objective measures were completed at Evaluation unless otherwise noted.  DIAGNOSTIC FINDINGS: 02/20/23: IMPRESSION:    Unremarkable MRI brain (with and without).  No acute findings.    COGNITION: Overall cognitive status: Within functional limits for tasks assessed   SENSATION: Light touch: WFL  POSTURE: forward head   LOWER EXTREMITY MMT:    MMT Right Eval Left Eval  Hip flexion 3 3  Hip extension    Hip abduction 4+ 4+  Hip adduction 4+ 4+  Hip internal rotation    Hip external rotation    Knee flexion 4- 4  Knee extension 5 5  Ankle dorsiflexion    Ankle plantarflexion    Ankle inversion    Ankle eversion    (Blank rows = not tested)  All tested in sitting    TRANSFERS: Assistive device utilized: None  Sit to stand: Modified independence Stand to sit: Modified independence    Performs with wide BOS  GAIT: Gait pattern: step through pattern Distance walked: Clinic distances  Assistive device utilized: Single point cane and None Level of assistance: Modified independence and SBA Comments: No overt unsteadiness noted, but some path deviation noted with no AD    FUNCTIONAL TESTS:  5 times sit to stand: 17.3 seconds with no UE support  30 seconds chair stand test: 9 sit <> stands with no UE support, pt  reporting 4-5/10 RPE (<10 below normal for age) Gait speed: 12.4 seconds with no AD = 2.65 ft/sec     M-CTSIB  Condition 1: Firm Surface, EO 30 Sec, Normal Sway  Condition 2: Firm Surface, EC 30 Sec, Normal Sway  Condition 3: Foam Surface, EO 30 Sec, Mild Sway  Condition 4: Foam Surface, EC 30 Sec, Mild Sway                                                                                          TREATMENT DATE: 01-31-24  NeuroRe-ed: Rt sidelying test - (-) for nystagmus; pt reported feeling as if dizziness was going to start - did report mild dizziness after approx. 5 secs in sidelying position - no nystagmus noted; pt reported light-headedness with return to sitting position Lt sidelying test - (-) for nystagmus;  less dizziness reported in this position compared to Rt sidelying position  Rt Dix-Hallpike test (-) for nystagmus and c/o vertigo Lt Dix-Hallpike test (-) for nystagmus and c/o vertigo - pt reported dizziness/light-headedness with return to sitting from both Rt & Lt Dix-Hallpike test positions    TherEx:   issued Medbridge HEP for bil. LE strengthening - pt performed the following exercises 10 reps each  Access Code: [email or web address] URL: [email or web address] Date: 02/01/2024 Prepared by: Maebelle Munroe  Exercises - Bridge  - 1 x daily - 7 x weekly - 1 sets - 10 reps - 5 sec hold hold - Supine Active Straight Leg Raise  - 1 x daily - 7 x weekly - 3 sets - 10 reps - Sidelying Hip Abduction  - 1 x daily - 7 x weekly - 3 sets - 10 reps - Sit to Stand  - 1 x daily - 7 x weekly - 1 sets - 10 reps  PATIENT EDUCATION: Education details: Medbridge HEP Access Code: Sales promotion account executive Person educated: Patient Education method: Explanation, demonstration, handouts Education comprehension: verbalized understanding, demonstrated understanding  HOME EXERCISE PROGRAM: See above  GOALS: Goals reviewed with patient? Yes  SHORT TERM GOALS: Target date: 02/15/2024  Pt will be  independent with initial HEP for strength/balance in order to build upon functional gains made in therapy. Baseline: Goal status: INITIAL  2.  Further vestibular assessment to be written with LTG updated Baseline:  Goal status: INITIAL  3.  FGA to be assessed with LTG written.  Baseline:  Goal status: INITIAL  4.  Pt will improve 5x sit<>stand to less than or equal to 15 sec to demonstrate improved functional strength and transfer efficiency.   Baseline: 17.3 seconds with no UE support  Goal status: INITIAL    LONG TERM GOALS: Target date: 03/07/2024  Pt will be independent with final HEP for strength/balance in  order to build upon functional gains made in therapy. Baseline:  Goal status: INITIAL  2.  FGA goal to be written as appropriate.  Baseline:  Goal status: INITIAL  3.  Vestibular goal to be written as appropriate.  Baseline:  Goal status: INITIAL  4.  Pt will improve 30 second chair stand to at least 11 sit <> stands in order to demo above norm for age.   Baseline: 9 sit <> stands with no UE support Goal status: INITIAL  5.  Pt will improve gait speed with no AD vs. LRAD to at least 2.9 ft/sec in order to demo improved community mobility.   Baseline: 12.4 seconds with no AD = 2.65 ft/sec  Goal status: INITIAL    ASSESSMENT:  CLINICAL IMPRESSION: PT session focused on further vestibular assessment with positional testing performed for BPPV; pt had no nystagmus with any positional testing, including Rt & Lt sidelying tests and Rt & Lt Dix-Hallpike tests.  Pt did report light-headedness with return to seated position on all 4 tests, appearing to be due to change in BP with upright sitting.  Pt had dyspnea with supine SLR exercise, but was able to sit up after completion of 10 reps SLR on RLE for easier breathing, and then resume exercise for LLE in supine position.  Will continue to address decreased vestibular input in maintaining balance.  Cont with POC.     OBJECTIVE IMPAIRMENTS: Abnormal gait, cardiopulmonary status limiting activity, decreased activity tolerance, decreased balance, decreased endurance, decreased mobility, difficulty walking, decreased strength, dizziness, postural dysfunction, and pain.   ACTIVITY LIMITATIONS: transfers and locomotion level  PARTICIPATION LIMITATIONS: community activity  PERSONAL FACTORS: Age, Behavior pattern, Past/current experiences, Time since onset of injury/illness/exacerbation, and 3+ comorbidities: AAA, COPD, CAD, fibromyalgia, HLD, HTN, hx of MI, PAD, lumbar spinal surgery 03/24/22, history of severe spinal stenosis at L4-5 status post decompression and fusion from L3-L5 levels. Now with worsening spinal stenosis at L2-3 level.   are also affecting patient's functional outcome.   REHAB POTENTIAL: Good  CLINICAL DECISION MAKING: Evolving/moderate complexity  EVALUATION COMPLEXITY: Moderate  PLAN:  PT FREQUENCY: 2x/week  PT DURATION: 8 weeks - anticipate 6 weeks   PLANNED INTERVENTIONS: 97164- PT Re-evaluation, 97110-Therapeutic exercises, 97530- Therapeutic activity, [id number]- Neuromuscular re-education, 97535- Self Care, [id number]- Manual therapy, [id number]- Gait training, [phone]- Canalith repositioning, [phone]- Aquatic Therapy, Patient/Family education, Balance training, Stair training, Vestibular training, and DME instructions  PLAN FOR NEXT SESSION: do FGA. Review/reissue balance on foam exercises for HEP  Did she change her mind about aquatics in the future?    Kary Kos, PT 02/01/2024, 1:23 PM

## 2024-02-02 ENCOUNTER — Encounter: Payer: Self-pay | Admitting: Physical Therapy

## 2024-02-02 ENCOUNTER — Ambulatory Visit: Payer: Medicare Other | Admitting: Physical Therapy

## 2024-02-02 DIAGNOSIS — H8111 Benign paroxysmal vertigo, right ear: Secondary | ICD-10-CM | POA: Diagnosis not present

## 2024-02-02 DIAGNOSIS — M6281 Muscle weakness (generalized): Secondary | ICD-10-CM | POA: Diagnosis not present

## 2024-02-02 DIAGNOSIS — R42 Dizziness and giddiness: Secondary | ICD-10-CM | POA: Diagnosis not present

## 2024-02-02 DIAGNOSIS — R2689 Other abnormalities of gait and mobility: Secondary | ICD-10-CM | POA: Diagnosis not present

## 2024-02-02 DIAGNOSIS — R2681 Unsteadiness on feet: Secondary | ICD-10-CM | POA: Diagnosis not present

## 2024-02-02 NOTE — Therapy (Signed)
 OUTPATIENT PHYSICAL THERAPY NEURO TREATMENT NOTE   Patient Name: VELENCIA LENART MRN: [id number] DOB:11/04/1946, 78 y.o., female Today's Date: 02/02/2024   PCP: Willow Ora, MD  REFERRING PROVIDER: Suanne Marker, MD  END OF SESSION:  PT End of Session - 02/02/24 1205     Visit Number 3    Number of Visits 13    Date for PT Re-Evaluation 03/24/24    Authorization Type [employer]    Authorization Time Period 01-24-24 - 03-20-24    Authorization - Visit Number 3    Authorization - Number of Visits 17    PT Start Time 0929    PT Stop Time 1017    PT Time Calculation (min) 48 min    Equipment Utilized During Treatment Gait belt    Activity Tolerance Patient tolerated treatment well    Behavior During Therapy WFL for tasks assessed/performed               Past Medical History:  Diagnosis Date   AAA (abdominal aortic aneurysm) (HCC)    a. s/p stent grafting in 2009.   Adenomatous colon polyp 05/2001   Adenomatous duodenal polyp    Allergy    Anginal pain (HCC)    COPD (chronic obstructive pulmonary disease) (HCC)    pt denies COPD   Coronary artery disease    a. s/p CABG in 1995;  b. 05/2013 Neg MV, EF 82%;  c. 06/2013 Cath: LM nl, LAD 40-50p, LCX nl, RCA 80p/134m, VG->RCA->PDA->PLSA min irregs, LIMA->LAD atretic, vigorous LV fxn.   Diabetes mellitus without complication (HCC)    history of, resolved. 2017   Diverticulosis    Eczema, dyshidrotic    Eczema, dyshidrotic 1986   Dr. Gerilyn Nestle   Emphysema of lung [facility])    Fatty liver    Fibromyalgia    GERD (gastroesophageal reflux disease)    Hyperlipidemia    Hypertension    Migraine    Myocardial infarction [facility])    Osteoarthritis    Osteoporosis    unsure, having a bone scan soon   Peripheral arterial disease (HCC)    left leg diagnosed by Dr Patty Sermons   Renal artery stenosis [facility])    a. 2008 s/p PTA   Past Surgical History:  Procedure Laterality Date   ABDOMINAL AORTAGRAM N/A  03/06/2014   Procedure: ABDOMINAL AORTAGRAM;  Surgeon: Larina Earthly, MD;  Location: [facility] CATH LAB;  Service: Cardiovascular;  Laterality: N/A;   ABDOMINAL AORTIC ANEURYSM REPAIR  2009   stenting   ABDOMINAL HYSTERECTOMY     Total, 1992   BREAST BIOPSY     CARDIAC CATHETERIZATION     CARDIOVASCULAR STRESS TEST  11/11/2009   normal study   CARPAL TUNNEL RELEASE  08/1993   left wrist   CARPAL TUNNEL RELEASE  01/1997   right   CATARACT EXTRACTION, BILATERAL  2021   CORONARY ARTERY BYPASS GRAFT  07/1994   Triple by Dr Ofilia Neas   CORONARY STENT INTERVENTION N/A 02/02/2021   Procedure: CORONARY STENT INTERVENTION;  Surgeon: Yvonne Kendall, MD;  Location: [facility] INVASIVE CV LAB;  Service: Cardiovascular;  Laterality: N/A;   CORONARY STENT INTERVENTION N/A 06/26/2021   Procedure: CORONARY STENT INTERVENTION;  Surgeon: Tonny Bollman, MD;  Location: [facility] INVASIVE CV LAB;  Service: Cardiovascular;  Laterality: N/A;   CORONARY ULTRASOUND/IVUS N/A 06/26/2021   Procedure: Intravascular Ultrasound/IVUS;  Surgeon: Tonny Bollman, MD;  Location: [facility] INVASIVE CV LAB;  Service: Cardiovascular;  Laterality: N/A;  CORONARY ULTRASOUND/IVUS N/A 09/03/2021   Procedure: Intravascular Ultrasound/IVUS;  Surgeon: Orbie Pyo, MD;  Location: [facility] INVASIVE CV LAB;  Service: Cardiovascular;  Laterality: N/A;   dental implants     EYE SURGERY  03/2020   cataract   HAND SURGERY Right 09/2002   Right hand pulley release   LAMINECTOMY WITH POSTERIOR LATERAL ARTHRODESIS LEVEL 1 N/A 03/24/2022   Procedure: LAMINECTOMY, FORAMINOTOMY, LUMBAR TWO- THREE ; INTERTRANSVERSE FUSION LUMBAR TWO - LUMBAR THREE RIGHT;  Surgeon: Tia Alert, MD;  Location: [facility] OR;  Service: Neurosurgery;  Laterality: N/A;   LEFT HEART CATH AND CORONARY ANGIOGRAPHY N/A 06/26/2021   Procedure: LEFT HEART CATH AND CORONARY ANGIOGRAPHY;  Surgeon: Tonny Bollman, MD;  Location: [facility] INVASIVE CV LAB;  Service: Cardiovascular;  Laterality: N/A;   LEFT HEART CATH  AND CORS/GRAFTS ANGIOGRAPHY N/A 02/02/2021   Procedure: LEFT HEART CATH AND CORS/GRAFTS ANGIOGRAPHY;  Surgeon: Yvonne Kendall, MD;  Location: [facility] INVASIVE CV LAB;  Service: Cardiovascular;  Laterality: N/A;   LEFT HEART CATH AND CORS/GRAFTS ANGIOGRAPHY N/A 09/03/2021   Procedure: LEFT HEART CATH AND CORS/GRAFTS ANGIOGRAPHY;  Surgeon: Orbie Pyo, MD;  Location: [facility] INVASIVE CV LAB;  Service: Cardiovascular;  Laterality: N/A;   LEFT HEART CATH AND CORS/GRAFTS ANGIOGRAPHY N/A 07/15/2022   Procedure: LEFT HEART CATH AND CORS/GRAFTS ANGIOGRAPHY;  Surgeon: [place], Peter M, MD;  Location: [facility] INVASIVE CV LAB;  Service: Cardiovascular;  Laterality: N/A;   LEFT HEART CATHETERIZATION WITH CORONARY/GRAFT ANGIOGRAM N/A 06/11/2013   Procedure: LEFT HEART CATHETERIZATION WITH Isabel Caprice;  Surgeon: Vesta Mixer, MD;  Location: [facility] CATH LAB;  Service: Cardiovascular;  Laterality: N/A;   MULTIPLE TOOTH EXTRACTIONS  2000   All upper teeth.  Has full dneture.    RENAL ARTERY STENT  2008   SHOULDER ARTHROSCOPY WITH ROTATOR CUFF REPAIR Right 09/27/2018   Procedure: Right shoulder mini open rotator cuff repair;  Surgeon: Jene Every, MD;  Location: [facility] ORS;  Service: Orthopedics;  Laterality: Right;  90 mins   thyroid cyst apiration  05/1997 and 07/1998   TONSILECTOMY, ADENOIDECTOMY, BILATERAL MYRINGOTOMY AND TUBES  1989   TONSILLECTOMY     TOTAL ABDOMINAL HYSTERECTOMY  1992   Patient Active Problem List   Diagnosis Date Noted   Oxygen dependent 04/29/2023   Centrilobular emphysema (HCC) 02/17/2023   Chronic respiratory failure (HCC) 02/17/2023   Pneumonia 02/17/2023   Senile purpura (HCC) 12/22/2022   Daily consumption of alcohol 12/22/2022   S/P lumbar laminectomy 03/24/2022   Thyroid nodule greater than or equal to 1.5 cm in diameter incidentally noted on imaging study 08/27/2021   Angina pectoris (HCC) 06/25/2021   History of compression fracture of spine - lumbar 06/19/2021   History of  lumbosacral spine surgery 03/03/2021   NSTEMI (non-ST elevated myocardial infarction) (HCC) 2022 01/31/2021   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/26/2021   Neuropathic pain 12/05/2020   Gait instability 09/09/2020   Lumbar stenosis without neurogenic claudication 02/26/2020   COPD with chronic bronchitis (HCC) 09/28/2018   Essential (primary) hypertension 07/10/2018   IFG (impaired fasting glucose) 11/10/2017   DJD (degenerative joint disease), lumbosacral 11/10/2017   Fibromyalgia 11/10/2017   Dyshidrotic eczema 11/10/2017   GERD (gastroesophageal reflux disease) 11/10/2017   Osteopenia 11/10/2017   Does use hearing aid 11/10/2017   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   Fatty liver 11/10/2017   Coronary artery disease    Peripheral vascular disease with claudication (HCC)    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   History of abdominal aortic aneurysm  repair 05/18/2011   History of renal artery stenosis 05/18/2011   Hyperlipidemia LDL goal <70     ONSET DATE: 12/14/2023  REFERRING DIAG: R53.1 (ICD-10-CM) - Weakness H55.00 (ICD-10-CM) - Nystagmus H81.4 (ICD-10-CM) - Central positional vertigo  THERAPY DIAG:  Muscle weakness (generalized)  Unsteadiness on feet  Other abnormalities of gait and mobility  Dizziness and giddiness  Rationale for Evaluation and Treatment: Rehabilitation  SUBJECTIVE:                                                                                                                                                                                             SUBJECTIVE STATEMENT: Pt reports she took oxycodone this morning; has had a lot of muscle soreness after doing the exercises here on Tuesday, but did do them again yesterday; pt states she walked on treadmill at home this morning before PT appt.   Pt accompanied by: self  PERTINENT HISTORY: PMH: AAA, COPD, CAD, fibromyalgia, HLD, HTN, hx of MI, PAD, lumbar spinal surgery 03/24/22, history of severe  spinal stenosis at L4-5 status post decompression and fusion from L3-L5 levels. Now with worsening spinal stenosis at L2-3 level.   Per Dr. Marjory Lies: Gait difficulty likely related to hip flexor weakness associated with L2-3 spinal stenosis.   PAIN:  Are you having pain? Yes, in pain most of the time, has fibromyalgia, on oxycodone   PRECAUTIONS: Fall  There were no vitals filed for this visit.    FALLS: Has patient fallen in last 6 months? No  LIVING ENVIRONMENT: Lives with: lives alone, husband in a nursing house  Lives in: House/apartment Stairs: 3rd floor apartment, can use an elevator  Has following equipment at home: Single point cane, Environmental consultant - 4 wheeled, and Grab bars  PLOF: Independent and Independent with community mobility with device  PATIENT GOALS: Wants to work on her strength, re-assess for BPPV, working on some balance    OBJECTIVE:  Note: Objective measures were completed at Evaluation unless otherwise noted.  DIAGNOSTIC FINDINGS: 02/20/23: IMPRESSION:    Unremarkable MRI brain (with and without).  No acute findings.    COGNITION: Overall cognitive status: Within functional limits for tasks assessed   SENSATION: Light touch: WFL  POSTURE: forward head   LOWER EXTREMITY MMT:    MMT Right Eval Left Eval  Hip flexion 3 3  Hip extension    Hip abduction 4+ 4+  Hip adduction 4+ 4+  Hip internal rotation    Hip external rotation    Knee flexion 4- 4  Knee extension 5 5  Ankle dorsiflexion    Ankle plantarflexion    Ankle  inversion    Ankle eversion    (Blank rows = not tested)  All tested in sitting    TRANSFERS: Assistive device utilized: None  Sit to stand: Modified independence Stand to sit: Modified independence    Performs with wide BOS   GAIT: Gait pattern: step through pattern Distance walked: Clinic distances  Assistive device utilized: Single point cane and None Level of assistance: Modified independence and  SBA Comments: No overt unsteadiness noted, but some path deviation noted with no AD    FUNCTIONAL TESTS:  5 times sit to stand: 17.3 seconds with no UE support  30 seconds chair stand test: 9 sit <> stands with no UE support, pt reporting 4-5/10 RPE (<10 below normal for age) Gait speed: 12.4 seconds with no AD = 2.65 ft/sec     M-CTSIB  Condition 1: Firm Surface, EO 30 Sec, Normal Sway  Condition 2: Firm Surface, EC 30 Sec, Normal Sway  Condition 3: Foam Surface, EO 30 Sec, Mild Sway  Condition 4: Foam Surface, EC 30 Sec, Mild Sway                                                                                          TREATMENT DATE: 02-02-24  NeuroRe-ed:  Standing Balance: Surface: Airex Position: Narrow Base of Support Feet Hip Width Apart Completed with: Eyes Open and Eyes Closed; Head Turns x 5 Reps and Head Nods x 5 Reps  Issued these exercises for HEP - the 4 positions - feet apart/together and EO and EC   Pt performed marching on Airex - EO with UE support on back of chair prn - 10 reps each leg  Pt performed stepping down to floor from Airex with contralateral head turn to opposite side 5 reps each side   HEP: Feet Together (Compliant Surface) Head Motion - Eyes Closed    Stand on compliant surface: __foam pad______ with feet together. Close eyes and move head slowly, up and down. Repeat __1__ times per session. Do __1__ sessions per day.   Gait:   [facility] PT Assessment - 02/02/24 0001       Functional Gait  Assessment   Gait assessed  Yes    Gait Level Surface Walks 20 ft in less than 7 sec but greater than 5.5 sec, uses assistive device, slower speed, mild gait deviations, or deviates 6-10 in outside of the 12 in walkway width.   6.63   Change in Gait Speed Able to change speed, demonstrates mild gait deviations, deviates 6-10 in outside of the 12 in walkway width, or no gait deviations, unable to achieve a major change in velocity, or uses a change in  velocity, or uses an assistive device.    Gait with Horizontal Head Turns Performs head turns smoothly with slight change in gait velocity (eg, minor disruption to smooth gait path), deviates 6-10 in outside 12 in walkway width, or uses an assistive device.    Gait with Vertical Head Turns Performs task with moderate change in gait velocity, slows down, deviates 10-15 in outside 12 in walkway width but recovers, can continue to walk.    Gait  and Pivot Turn Pivot turns safely within 3 sec and stops quickly with no loss of balance.    Step Over Obstacle Is able to step over 2 stacked shoe boxes taped together (9 in total height) without changing gait speed. No evidence of imbalance.    Gait with Narrow Base of Support Ambulates 4-7 steps.    Gait with Eyes Closed Walks 20 ft, uses assistive device, slower speed, mild gait deviations, deviates 6-10 in outside 12 in walkway width. Ambulates 20 ft in less than 9 sec but greater than 7 sec.    Ambulating Backwards Walks 20 ft, no assistive devices, good speed, no evidence for imbalance, normal gait    Steps Alternating feet, must use rail.    Total Score 21               TherEx:    Pt performed leg press exercise - 50# bil. LE's 3 sets 10 reps  Performed standing hip flexion, abduction and extension with red theraband 10 reps each leg Performed standing hip flexion with knee flexed at 90 degrees 10 reps each leg with red theraband   issued Medbridge HEP for bil. LE strengthening - pt performed the following exercises 10 reps each  Access Code: [email or web address] URL: [email or web address] Date: 02/01/2024 Prepared by: Maebelle Munroe  Exercises - Bridge  - 1 x daily - 7 x weekly - 1 sets - 10 reps - 5 sec hold hold - Supine Active Straight Leg Raise  - 1 x daily - 7 x weekly - 3 sets - 10 reps - Sidelying Hip Abduction  - 1 x daily - 7 x weekly - 3 sets - 10 reps - Sit to Stand  - 1 x daily - 7 x weekly - 1 sets - 10 reps  PATIENT  EDUCATION: Education details: Medbridge HEP Access Code: [email or web address]; added standing theraband exercises to HEP (red) Person educated: Patient Education method: Explanation, demonstration, handouts Education comprehension: verbalized understanding, demonstrated understanding  HOME EXERCISE PROGRAM: See above  GOALS: Goals reviewed with patient? Yes  SHORT TERM GOALS: Target date: 02/15/2024  Pt will be independent with initial HEP for strength/balance in order to build upon functional gains made in therapy. Baseline: Goal status: INITIAL  2.  Further vestibular assessment to be written with LTG updated Baseline:  Goal status: INITIAL  3.  FGA to be assessed with LTG written.  Baseline: score 21/30 on 02-02-24 Goal status: Goal met 02-02-24  4.  Pt will improve 5x sit<>stand to less than or equal to 15 sec to demonstrate improved functional strength and transfer efficiency.   Baseline: 17.3 seconds with no UE support  Goal status: INITIAL    LONG TERM GOALS: Target date: 03/07/2024  Pt will be independent with final HEP for strength/balance in order to build upon functional gains made in therapy. Baseline:  Goal status: INITIAL  2.  FGA goal to be written as appropriate. Increase FGA score to >/= 26/30 to reduce fall risk and increase safety with ambulation. Baseline: score 21/30 on 02-02-24 Goal status: INITIAL  3.  Vestibular goal to be written as appropriate.  Baseline:  Goal status: INITIAL  4.  Pt will improve 30 second chair stand to at least 11 sit <> stands in order to demo above norm for age.   Baseline: 9 sit <> stands with no UE support Goal status: INITIAL  5.  Pt will improve gait speed with no AD vs. LRAD to at least 2.9 ft/sec in order  to demo improved community mobility.   Baseline: 12.4 seconds with no AD = 2.65 ft/sec  Goal status: INITIAL    ASSESSMENT:  CLINICAL IMPRESSION: PT session focused on balance on foam exercises for HEP and assessment  of FGA.  Pt's FGA score = 21/30 (mild fall risk as score is below cut off score of 22/30)with pt having unsteadiness and mild deviation in path with ambulation with vertical head turns.  Pt also had more unsteadiness with standing on foam with vertical head turns compared to that with horizontal head turns.  Cont with POC.    OBJECTIVE IMPAIRMENTS: Abnormal gait, cardiopulmonary status limiting activity, decreased activity tolerance, decreased balance, decreased endurance, decreased mobility, difficulty walking, decreased strength, dizziness, postural dysfunction, and pain.   ACTIVITY LIMITATIONS: transfers and locomotion level  PARTICIPATION LIMITATIONS: community activity  PERSONAL FACTORS: Age, Behavior pattern, Past/current experiences, Time since onset of injury/illness/exacerbation, and 3+ comorbidities: AAA, COPD, CAD, fibromyalgia, HLD, HTN, hx of MI, PAD, lumbar spinal surgery 03/24/22, history of severe spinal stenosis at L4-5 status post decompression and fusion from L3-L5 levels. Now with worsening spinal stenosis at L2-3 level.   are also affecting patient's functional outcome.   REHAB POTENTIAL: Good  CLINICAL DECISION MAKING: Evolving/moderate complexity  EVALUATION COMPLEXITY: Moderate  PLAN:  PT FREQUENCY: 2x/week  PT DURATION: 8 weeks - anticipate 6 weeks   PLANNED INTERVENTIONS: 97164- PT Re-evaluation, 97110-Therapeutic exercises, 97530- Therapeutic activity, 97112- Neuromuscular re-education, 97535- Self Care, 30865- Manual therapy, [phone]- Gait training, [phone]- Canalith repositioning, [phone]- Aquatic Therapy, Patient/Family education, Balance training, Stair training, Vestibular training, and DME instructions  PLAN FOR NEXT SESSION: check balance on foam exercises; continue strengthening and balance exercises     Accalia Rigdon, Donavan Burnet, PT 02/02/2024, 12:11 PM

## 2024-02-02 NOTE — Patient Instructions (Signed)
 Feet Together (Compliant Surface) Head Motion - Eyes Closed    Stand on compliant surface: __foam pad______ with feet together. Close eyes and move head slowly, up and down. Repeat __1__ times per session. Do __1__ sessions per day.  Copyright  VHI. All rights reserved.

## 2024-02-03 NOTE — Progress Notes (Signed)
 Cardiology Office Note   Date:  02/13/2024   ID:  Quorra, Rosene [birth year], MRN [id number]  PCP:  Willow Ora, MD  Cardiologist: Jerline Linzy [place] MD  Chief Complaint  Patient presents with   Edema   Shortness of Breath   Dizziness    Patient stated her Legs and ankles swell at times and she has some SOB when she over does it    Coronary Artery Disease    History of Present Illness: Megan [name] is a 78 y.o. female who is seen for follow up CAD.  She has a history  of CAD, HTN, HL, and PAD.   She is status post coronary artery bypass graft surgery in 1995- Dr Tyrone Sage. She had a renal artery stent for renal artery stenosis in 2008. She had a stent graft of an abdominal aortic aneurysm 2009. She is s/p stenting of the left iliac. She is followed by [facility] for PAD. Last CT in May 2018. She had [place] in June 2019 showing no endoleak.    She was hospitalized in July 2014 and had cardiac catheterization which showed that her grafts were open. EF normal.  She was hospitalized 11/15/2014 with which he describes as an aching sensation in her chest.    She underwent a treadmill Myoview on 11/17/14 which showed no ischemia and her ejection fraction was 86%.   In June 2019 she had Abdominal US at [facility] showing AAA of 4 cm.   She was in the hospital  01/05/21 and discharged on 2/8- for AKI, diarrhea- diverticulitis (sigmoid colon), metabolic encephalopathy, UTI. Echo at that time was OK with normal EF and moderate TR.   She was re- admitted 2/26-02/03/21 with NSTEMI. Troponin up to 1085. Ecg showed significant inferolateral TWA. She underwent cardiac cath demonstrating patent LIMA to the LAD. The native RCA was occluded and she had severe sequential lesions in the SVG to RCA. This was treated with overlapping DES x 2- 3.0 x 26 and 3.5 x 34 mm Onyx stents.  Procedure was complicated by a left forearm hematoma.   On prior visit she complained of increased SOB- this was felt to be related to  Brilinta. We switched to Plavix but she had an allergic reaction with rash and hives. Later switched to Prasugrel- reduced dose based on age and female.    She presented to the ED on 06/25/21 with complaints of chest pain over the past 2 days. She underwent repeat cardiac cath demonstrating restenosis of the SVG to RCA with clear evidence of prior stent fracture. She underwent repeat PCI with 3.5 x 26 mm Onyx stent post dilated to 4.0 with [place] balloon using IVUS guidance. Of note there were no collaterals to the distal RCA. There was significant aortic calcification.   She was seen in the ED on 08/27/21 with recurrent chest pain. She underwent repeat cardiac cath which showed the stent was patent.   Was seen by Gillian Shields PA on Jully 25.  Noted symptoms of dyspnea. was seen by her PCP. Noted her BP was a little high and Losartan dose was increased to 100mg  daily. The next day started having cold chills and shaking. Felt she was coming down with a cold. Noted body aches, temperature was 39 F which is much higher than her baseline. Cough with sputum production. COVID tests x3 were negative. Later developed  sporadic shortness of breath associated with chest aching. She continued to have symptoms of atypical chest pain and  some dyspnea so we proceeded with repeat cardiac cath in August 2023. This showed patent grafts and no new disease.  She has had progressive SOB. Seen by pulmonary and now on home oxygen. D dimer elevated but CT showed no PE. +emphysema. No cough. Minor swelling. Pro BNP mildly elevated 145. CXR without edema. We performed Echo which was normal.   She has since been followed by Dr [name] with pulmonary. Has advance emphysema. On oxygen during day.  In pulmonary rehab. BP is fluctuating from 115-160 systolic. She does complain of some dizziness  On follow up today she has completed Rehab. Now doing PT for strengthening. Breathing is stable. Notes occasional chest tightness- can't tell  whether lungs or heart. Not consistent. Has occasional sharp pain around shoulder blade. Walks 1-1.5 mile daily. s  Past Medical History:  Diagnosis Date   AAA (abdominal aortic aneurysm) (HCC)    a. s/p stent grafting in 2009.   Adenomatous colon polyp 05/2001   Adenomatous duodenal polyp    Allergy    Anginal pain (HCC)    COPD (chronic obstructive pulmonary disease) (HCC)    pt denies COPD   Coronary artery disease    a. s/p CABG in 1995;  b. 05/2013 Neg MV, EF 82%;  c. 06/2013 Cath: LM nl, LAD 40-50p, LCX nl, RCA 80p/186m, VG->RCA->PDA->PLSA min irregs, LIMA->LAD atretic, vigorous LV fxn.   Diabetes mellitus without complication (HCC)    history of, resolved. 2017   Diverticulosis    Eczema, dyshidrotic    Eczema, dyshidrotic 1986   Dr. Gerilyn Nestle   Emphysema of lung [facility])    Fatty liver    Fibromyalgia    GERD (gastroesophageal reflux disease)    Hyperlipidemia    Hypertension    Migraine    Myocardial infarction [facility])    Osteoarthritis    Osteoporosis    unsure, having a bone scan soon   Peripheral arterial disease (HCC)    left leg diagnosed by Dr Patty Sermons   Renal artery stenosis [facility])    a. 2008 s/p PTA    Past Surgical History:  Procedure Laterality Date   ABDOMINAL AORTAGRAM N/A 03/06/2014   Procedure: ABDOMINAL AORTAGRAM;  Surgeon: Larina Earthly, MD;  Location: [facility] CATH LAB;  Service: Cardiovascular;  Laterality: N/A;   ABDOMINAL AORTIC ANEURYSM REPAIR  2009   stenting   ABDOMINAL HYSTERECTOMY     Total, 1992   BREAST BIOPSY     CARDIAC CATHETERIZATION     CARDIOVASCULAR STRESS TEST  11/11/2009   normal study   CARPAL TUNNEL RELEASE  08/1993   left wrist   CARPAL TUNNEL RELEASE  01/1997   right   CATARACT EXTRACTION, BILATERAL  2021   CORONARY ARTERY BYPASS GRAFT  07/1994   Triple by Dr Ofilia Neas   CORONARY STENT INTERVENTION N/A 02/02/2021   Procedure: CORONARY STENT INTERVENTION;  Surgeon: Yvonne Kendall, MD;  Location: [facility] INVASIVE CV LAB;  Service:  Cardiovascular;  Laterality: N/A;   CORONARY STENT INTERVENTION N/A 06/26/2021   Procedure: CORONARY STENT INTERVENTION;  Surgeon: Tonny Bollman, MD;  Location: [facility] INVASIVE CV LAB;  Service: Cardiovascular;  Laterality: N/A;   CORONARY ULTRASOUND/IVUS N/A 06/26/2021   Procedure: Intravascular Ultrasound/IVUS;  Surgeon: Tonny Bollman, MD;  Location: [facility] INVASIVE CV LAB;  Service: Cardiovascular;  Laterality: N/A;   CORONARY ULTRASOUND/IVUS N/A 09/03/2021   Procedure: Intravascular Ultrasound/IVUS;  Surgeon: Orbie Pyo, MD;  Location: [facility] INVASIVE CV LAB;  Service: Cardiovascular;  Laterality: N/A;  dental implants     EYE SURGERY  03/2020   cataract   HAND SURGERY Right 09/2002   Right hand pulley release   LAMINECTOMY WITH POSTERIOR LATERAL ARTHRODESIS LEVEL 1 N/A 03/24/2022   Procedure: LAMINECTOMY, FORAMINOTOMY, LUMBAR TWO- THREE ; INTERTRANSVERSE FUSION LUMBAR TWO - LUMBAR THREE RIGHT;  Surgeon: Tia Alert, MD;  Location: [facility] OR;  Service: Neurosurgery;  Laterality: N/A;   LEFT HEART CATH AND CORONARY ANGIOGRAPHY N/A 06/26/2021   Procedure: LEFT HEART CATH AND CORONARY ANGIOGRAPHY;  Surgeon: Tonny Bollman, MD;  Location: [facility] INVASIVE CV LAB;  Service: Cardiovascular;  Laterality: N/A;   LEFT HEART CATH AND CORS/GRAFTS ANGIOGRAPHY N/A 02/02/2021   Procedure: LEFT HEART CATH AND CORS/GRAFTS ANGIOGRAPHY;  Surgeon: Yvonne Kendall, MD;  Location: [facility] INVASIVE CV LAB;  Service: Cardiovascular;  Laterality: N/A;   LEFT HEART CATH AND CORS/GRAFTS ANGIOGRAPHY N/A 09/03/2021   Procedure: LEFT HEART CATH AND CORS/GRAFTS ANGIOGRAPHY;  Surgeon: Orbie Pyo, MD;  Location: [facility] INVASIVE CV LAB;  Service: Cardiovascular;  Laterality: N/A;   LEFT HEART CATH AND CORS/GRAFTS ANGIOGRAPHY N/A 07/15/2022   Procedure: LEFT HEART CATH AND CORS/GRAFTS ANGIOGRAPHY;  Surgeon: [place], Rayjon Wery M, MD;  Location: [facility] INVASIVE CV LAB;  Service: Cardiovascular;  Laterality: N/A;   LEFT HEART CATHETERIZATION WITH  CORONARY/GRAFT ANGIOGRAM N/A 06/11/2013   Procedure: LEFT HEART CATHETERIZATION WITH Isabel Caprice;  Surgeon: Vesta Mixer, MD;  Location: [facility] CATH LAB;  Service: Cardiovascular;  Laterality: N/A;   MULTIPLE TOOTH EXTRACTIONS  2000   All upper teeth.  Has full dneture.    RENAL ARTERY STENT  2008   SHOULDER ARTHROSCOPY WITH ROTATOR CUFF REPAIR Right 09/27/2018   Procedure: Right shoulder mini open rotator cuff repair;  Surgeon: Jene Every, MD;  Location: [facility] ORS;  Service: Orthopedics;  Laterality: Right;  90 mins   thyroid cyst apiration  05/1997 and 07/1998   TONSILECTOMY, ADENOIDECTOMY, BILATERAL MYRINGOTOMY AND TUBES  1989   TONSILLECTOMY     TOTAL ABDOMINAL HYSTERECTOMY  1992     Current Outpatient Medications  Medication Sig Dispense Refill   Accu-Chek FastClix Lancets MISC Check blood sugar once daily PRN 100 each 1   albuterol (PROAIR HFA) 108 (90 Base) MCG/ACT inhaler Inhale 2 puffs into the lungs every 6 (six) hours as needed for wheezing or shortness of breath. 1 each 11   albuterol (PROVENTIL) (2.5 MG/3ML) 0.083% nebulizer solution Take 3 mLs (2.5 mg total) by nebulization every 6 (six) hours as needed for wheezing or shortness of breath. 360 mL 5   ALPRAZolam (XANAX) 0.25 MG tablet Take 1 tablet (0.25 mg total) by mouth at bedtime as needed for sleep. 90 tablet 3   amLODipine (NORVASC) 5 MG tablet Take 1/2 tablet ( 2.5 mg ) daily     aspirin EC 81 MG tablet Take 1 tablet (81 mg total) by mouth daily. 90 tablet 3   atorvastatin (LIPITOR) 80 MG tablet TAKE 1 TABLET BY MOUTH EVERY DAY 90 tablet 3   B Complex-Biotin-FA (B-100 COMPLEX PO) Take 1 tablet by mouth every evening.     bismuth subsalicylate (PEPTO BISMOL) 262 MG/15ML suspension Take 30 mLs by mouth every 6 (six) hours as needed for indigestion or diarrhea or loose stools.     Budeson-Glycopyrrol-Formoterol (BREZTRI AEROSPHERE) 160-9-4.8 MCG/ACT AERO Inhale 2 puffs into the lungs in the morning and at bedtime. 3  each 3   cetirizine (ZYRTEC) 10 MG tablet Take 10 mg by mouth daily as needed for allergies.  cholecalciferol (VITAMIN D3) 25 MCG (1000 UNIT) tablet Take 1,000 Units by mouth every evening.     CORDRAN 4 MCG/SQCM TAPE Apply 1 each topically daily as needed (Rash).      cycloSPORINE (RESTASIS) 0.05 % ophthalmic emulsion Place 1 drop into both eyes 2 (two) times daily.      ezetimibe (ZETIA) 10 MG tablet TAKE 1 TABLET BY MOUTH EVERY DAY 90 tablet 3   FLUZONE HIGH-DOSE 0.5 ML injection      gabapentin (NEURONTIN) 300 MG capsule Take 1 capsule (300 mg total) by mouth 2 (two) times daily. 270 capsule 3   glucose blood (ACCU-CHEK SMARTVIEW) test strip CHECK BLOOD SUGAR ONCE DAILY AS NEEDED 100 strip 1   HYDROcodone bit-homatropine (HYCODAN) 5-1.5 MG/5ML syrup Take 5 mLs by mouth every 6 (six) hours as needed for cough. 240 mL 0   isosorbide mononitrate (IMDUR) 30 MG 24 hr tablet TAKE 3 TABLETS ( 90 MG ) EVERY MORNING AND 1 TABLET (30 MG ) EVERY EVENING 360 tablet 2   losartan (COZAAR) 100 MG tablet TAKE 1 TABLET BY MOUTH EVERY DAY 90 tablet 3   metoprolol succinate (TOPROL-XL) 25 MG 24 hr tablet TAKE 1 TABLET (25 MG TOTAL) BY MOUTH DAILY. 90 tablet 2   Multiple Vitamins-Minerals (MULTIVITAMIN WITH MINERALS) tablet Take 1 tablet by mouth every evening.     nitroGLYCERIN (NITROSTAT) 0.4 MG SL tablet Place 1 tablet (0.4 mg total) under the tongue every 5 (five) minutes as needed for chest pain. 25 tablet 11   omeprazole (PRILOSEC) 40 MG capsule Take 1 capsule (40 mg total) by mouth daily. 90 capsule 3   ondansetron (ZOFRAN) 8 MG tablet Take 1 tablet (8 mg total) by mouth every 6 (six) hours as needed for nausea or vomiting. 20 tablet 0   oxyCODONE-acetaminophen (PERCOCET) 10-325 MG tablet Take 1 tablet by mouth in the morning and at bedtime. Can take one tablet three times a day as needed for back pain     polyethylene glycol (MIRALAX / GLYCOLAX) 17 g packet Take 17 g by mouth daily as needed for  moderate constipation.     prasugrel (EFFIENT) 5 MG TABS tablet TAKE 1 TABLET (5 MG TOTAL) BY MOUTH DAILY. 90 tablet 3   PRESCRIPTION MEDICATION Place 2 puffs into both ears daily as needed (ear infections). [facility] ear powder     Simethicone (GAS-X PO) Take 1-2 tablets by mouth daily as needed (gas).     SPIKEVAX syringe      torsemide (DEMADEX) 20 MG tablet Take 1 tablet (20 mg total) by mouth daily. Take 1/2 tablet daily as needed for swelling 90 tablet 3   tretinoin (RETIN-A) 0.1 % cream Apply 1 application. topically every evening.     azithromycin (ZITHROMAX) 250 MG tablet Take 1 tablet (250 mg total) by mouth 3 (three) times a week. Monday Wednesday Friday (Patient not taking: Reported on 02/13/2024) 30 tablet 6   No current facility-administered medications for this visit.    Allergies:   Brilinta [ticagrelor], Bextra [valdecoxib], Hydrocod poli-chlorphe poli er, Lipitor [atorvastatin], Mevacor [lovastatin], Plavix [clopidogrel bisulfate], Zocor [simvastatin], Doxycycline, Metoprolol, Morphine and codeine, Codeine, and Lisinopril    Social History:  The patient  reports that she quit smoking about 27 years ago. Her smoking use included cigarettes. She has never been exposed to tobacco smoke. She has never used smokeless tobacco. She reports that she does not currently use alcohol after a past usage of about 6.0 - 10.0 standard drinks of  alcohol per week. She reports that she does not use drugs.   Family History:  The patient's family history includes AAA (abdominal aortic aneurysm) in her brother; Breast cancer in her sister; Colon cancer in her cousin and paternal grandmother; Diabetes in her brother and sister; Heart attack in her brother, father, mother, and sister; Heart disease in her brother, brother, father, mother, and sister; Hyperlipidemia in her brother and sister; Hypertension in her brother and sister; Liver cancer in her sister.    ROS:  Please see the history of present  illness.   Otherwise, review of systems are positive for none.   All other systems are reviewed and negative.    PHYSICAL EXAM: VS:  BP (!) 120/58   Pulse 68   Ht 4\' 11"  (1.499 m)   Wt 132 lb 9.6 oz (60.1 kg)   SpO2 93%   BMI 26.78 kg/m  , BMI Body mass index is 26.78 kg/m. GENERAL:   WF in NAD HEENT:  PERRL, EOMI, sclera are clear. Oropharynx is clear. NECK:  No jugular venous distention, carotid upstroke brisk and symmetric, no bruits, no thyromegaly or adenopathy LUNGS:  Clear to auscultation bilaterally CHEST:  Unremarkable HEART:  RRR,  PMI not displaced or sustained,S1 and S2 within normal limits, no S3, no S4: no clicks, no rubs, no murmurs ABD:  Soft, nontender. BS +, no masses or bruits. No hepatomegaly, no splenomegaly EXT:  1 + pulses throughout, no edema, no cyanosis no clubbing SKIN:  Warm and dry.  No rashes NEURO:  Alert and oriented x 3. Cranial nerves II through XII intact. PSYCH:  Cognitively intact     EKG Interpretation Date/Time:  Monday February 13 2024 10:52:01 EDT Ventricular Rate:  68 PR Interval:  188 QRS Duration:  100 QT Interval:  430 QTC Calculation: 457 R Axis:   -3  Text Interpretation: Normal sinus rhythm Minimal voltage criteria for LVH, may be normal variant ( Cornell product ) When compared with ECG of 11-Jul-2022 19:13, T wave inversion more evident in Inferior leads Nonspecific T wave abnormality now evident in Lateral leads Confirmed by [place], Gitel Beste [phone]) on 02/13/2024 11:01:13 AM   Recent Labs: 02/17/2023: Hemoglobin 14.4; Platelets 259.0; Pro B Natriuretic peptide (BNP) 145.0 11/07/2023: TSH 0.97 02/09/2024: ALT 16; BUN 9; Creatinine, Ser 0.72; Potassium 4.7; Sodium 141   Labs from primary care 12/15/16: A1c 5.6%. : Cholesterol 167 Trig-209, HDL 52, LDL 73.  AST 51, ALT 65. Normal renal indices. TSH is normal. Dated 06/24/17: CRP <0.3. Glucose 120, AST 58, ALT 80, other chemistries normal.  Dated 09/28/18: normal CBC, CMET  Lipid  Panel    Component Value Date/Time   CHOL 122 02/09/2024 0817   TRIG 107 02/09/2024 0817   HDL 53 02/09/2024 0817   CHOLHDL 2.3 02/09/2024 0817   CHOLHDL 2 12/22/2022 1110   VLDL 26.0 12/22/2022 1110   LDLCALC 49 02/09/2024 0817   LDLDIRECT 82.0 06/13/2020 1017      Wt Readings from Last 3 Encounters:  02/13/24 132 lb 9.6 oz (60.1 kg)  12/26/23 135 lb (61.2 kg)  12/14/23 128 lb (58.1 kg)    Cardiac cath / PCI 02/02/21: CORONARY STENT INTERVENTION  LEFT HEART CATH AND CORS/GRAFTS ANGIOGRAPHY    Conclusion  Conclusions: Severe single-vessel coronary artery disease with chronic total occlusion of mid RCA. Mild to moderate, diffuse LAD disease with competitive flow in the distal vessel from LIMA-LAD. Small but patent LIMA-LAD. Patent SVG-rPDA-rPL with diffuse ostial/proximal disease of up to  60% and tandem hazy 95% and 90% mid graft lesions. Successful PCI to ostial/proximal and mid SVG-rPDA-rPL using non-overlapping Resolute Onyx 3.0 x 26 mm and 3.5 x 34 mm drug-eluting stents with 0% residual stenosis and TIMI-3 flow. Left forearm hematoma following sheath removal ant TR band placement, controlled with manual compression and placement of second TR band proximal to the first band.   Recommendations: Dual antiplatelet therapy with aspirin and ticagrelor for at least 12 months. Aggressive secondary prevention. Close monitoring of left forearm hematoma.   Yvonne Kendall, MD [facility]    Coronary Diagrams   Diagnostic Dominance: Right    Intervention     Implants       Echo 01/31/21: IMPRESSIONS     1. Left ventricular ejection fraction, by estimation, is 60 to 65%. The  left ventricle has normal function. The left ventricle has no regional  wall motion abnormalities. Left ventricular diastolic parameters are  consistent with Grade I diastolic  dysfunction (impaired relaxation).   2. Right ventricular systolic function is normal. The right ventricular   size is normal. There is normal pulmonary artery systolic pressure.   3. Left atrial size was severely dilated.   4. The mitral valve is grossly normal. Mild mitral valve regurgitation:  Apperance of two jets; study may underestimated regurgitation severity.   5. The aortic valve is tricuspid. Aortic valve regurgitation is not  visualized. No aortic stenosis is present.   6. The inferior vena cava is normal in size with greater than 50%  respiratory variability, suggesting right atrial pressure of 3 mmHg.   Comparison(s): A prior study was performed on 01/09/21. No significant  change from prior study. Prior images reviewed side by side. Similar to  prior.    Cardiac cath 06/26/21: Procedures  CORONARY STENT INTERVENTION  Intravascular Ultrasound/IVUS  LEFT HEART CATH AND CORONARY ANGIOGRAPHY   Conclusion      Dist LM lesion is 15% stenosed.   Prox RCA lesion is 75% stenosed.   Mid RCA to Dist RCA lesion is 100% stenosed with 100% stenosed side branch in RPAV.   Origin to Prox Graft lesion before RPDA  is 80% stenosed.   Non-stenotic Mid Graft-1 lesion before RPDA was previously treated.   Non-stenotic Mid Graft-2 lesion before RPDA was previously treated.   A drug-eluting stent was successfully placed using a STENT ONYX FRONTIER 3.5X26.   Post intervention, there is a 20% residual stenosis.   Seq SVG- and is large.   LIMA and is small.   There is competitive flow.   1.  Stable native vessel coronary artery disease with chronic total occlusion of the proximal to mid RCA, patency of the left main, diffuse mild to moderate calcific nonobstructive LAD stenosis, and moderate nonobstructive proximal left circumflex stenosis 2.  Continued patency of the LIMA to LAD 3.  Continued patency of the sequential saphenous vein graft to PDA and posterolateral branches with severe in-stent restenosis in the proximal stent, both lesions likely related to stent fracture 4.  Successful intravascular  ultrasound-guided PCI of the saphenous vein graft to RCA using a 3.5 x 26 mm resolute Onyx DES postdilated to high pressure with a 4.0 mm noncompliant balloon.  Diagnostic Dominance: Right Intervention    Implants    Cardiac cath 09/03/21:  Orbie Pyo, MD (Primary)    [place], Shanan Mcmiller M, MD (Assisting)     Procedures  Intravascular Ultrasound/IVUS  LEFT HEART CATH AND CORS/GRAFTS ANGIOGRAPHY   Conclusion  Dist LM lesion is 15% stenosed.   Prox RCA lesion is 75% stenosed.   Mid RCA to Dist RCA lesion is 100% stenosed with 100% stenosed side branch in RPAV.   Origin to Prox Graft lesion before RPDA  is 20% stenosed.   Non-stenotic Mid Graft-1 lesion before RPDA was previously treated.   Non-stenotic Mid Graft-2 lesion before RPDA was previously treated.   Seq SVG- and is large.   LIMA and is small.   There is competitive flow.   LV end diastolic pressure is normal.    Patent vein graft to PDA with minimal constraint; IVUS interrogation demonstrated a minimal stent area of 5.82mm2. Patent LIMA to LAD with relatively unchanged burden of disease elsewhere. Normal LVEDP   Recommendations:  Medical management.   Cardiac cath 07/15/22:  LEFT HEART CATH AND CORS/GRAFTS ANGIOGRAPHY   Conclusion      Dist LM lesion is 15% stenosed.   Prox RCA lesion is 75% stenosed.   Mid RCA to Dist RCA lesion is 100% stenosed with 100% stenosed side branch in RPAV.   Origin to Prox Graft lesion before RPDA  is 30% stenosed.   Prox Graft to Mid Graft lesion before RPDA  is 20% stenosed.   Non-stenotic Mid Graft-1 lesion before RPDA was previously treated.   Non-stenotic Mid Graft-2 lesion before RPDA was previously treated.   Seq SVG- and is large.   LIMA and is small.   There is competitive flow.   The left ventricular systolic function is normal.   LV end diastolic pressure is normal.   The left ventricular ejection fraction is greater than 65% by visual estimate.   2 vessel  obstructive CAD Patent LIMA to the LAD Patent SVG to RCA. Mild nonobstructive disease Normal LV function Normal LVEDP   Plan: continue medical therapy.  Coronary Diagrams  Diagnostic Dominance: Right  Echo 04/25/23: IMPRESSIONS     1. Intracavitary gradient. Peak velocity 1.67 m/s. Peak gradient 11 mmHg.  There is no significant change in gradient with Valsalva. Left ventricular  ejection fraction, by estimation, is 65 to 70%. The left ventricle has  normal function. The left  ventricle has no regional wall motion abnormalities. Left ventricular  diastolic parameters are consistent with Grade I diastolic dysfunction  (impaired relaxation).   2. Right ventricular systolic function is normal. The right ventricular  size is normal.   3. Left atrial size was mildly dilated.   4. The mitral valve is normal in structure. No evidence of mitral valve  regurgitation. No evidence of mitral stenosis. Moderate mitral annular  calcification.   5. The aortic valve is normal in structure. Aortic valve regurgitation is  not visualized. No aortic stenosis is present.   6. The inferior vena cava is normal in size with greater than 50%  respiratory variability, suggesting right atrial pressure of 3 mmHg.   ASSESSMENT AND PLAN:  1. CAD status post CABG in 1995.  s/p NSTEMI in Feb 2022.  Cardiac cath demonstrated patent LIMA to the LAD. Occluded native RCA. SVG to PDA/PL had severe sequential stenoses treated with DES x 2. Normal EF.  Developed dyspnea on Brilinta. Allergic reaction to Plavix. Now on Prasugrel lower dose due to age and female sex. Had recurrent angina in July 2022. Restenosis in the proximal stent in the SVG to RCA with some associated stent fracture. Had repeat stenting with IVUS guidance. Last cardiac cath August 2023 showed stable disease with only 30% in stent stenosis in SVG  to RCA. Normal LV function and normal LVEDP. She has stable angina. Will continue current therapy.  2. HTN  with hypertensive heart disease. BP is fluctuating a fair amount. Would allow more liberal BP to avoid hypotension with her dizziness and risk of falls.   3. Hypercholesterolemia- LDL 49 on statin and Zetia.   4.  Status post stent to right renal artery 2008. Patent by CT in August 2024  5. Status post stent graft to abdominal aortic aneurysm 2009.  S/p iliac stent in 2016.follow up with [facility].   6. Peripheral artery disease followed by Dr.Cain  7.  Severe emphysema. Per Dr [name].    Dispostion: follow up in 6 months    Signed, Kadyn Guild [place] MD, [facility]    02/13/2024 11:07 AM    [facility]

## 2024-02-06 ENCOUNTER — Other Ambulatory Visit: Payer: Self-pay

## 2024-02-06 MED ORDER — OMEPRAZOLE 40 MG PO CPDR: 40.0000 mg | DELAYED_RELEASE_CAPSULE | Freq: Every day | ORAL | 3 refills | Status: DC

## 2024-02-07 ENCOUNTER — Ambulatory Visit: Payer: Medicare Other | Attending: Diagnostic Neuroimaging | Admitting: Physical Therapy

## 2024-02-07 DIAGNOSIS — R2681 Unsteadiness on feet: Secondary | ICD-10-CM | POA: Diagnosis not present

## 2024-02-07 DIAGNOSIS — M6281 Muscle weakness (generalized): Secondary | ICD-10-CM | POA: Insufficient documentation

## 2024-02-07 DIAGNOSIS — R2689 Other abnormalities of gait and mobility: Secondary | ICD-10-CM | POA: Diagnosis not present

## 2024-02-07 NOTE — Therapy (Unsigned)
 OUTPATIENT PHYSICAL THERAPY NEURO TREATMENT NOTE   Patient Name: Megan [name] MRN: [id number] DOB:21-Aug-1946, 78 y.o., female Today's Date: 02/08/2024   PCP: Willow Ora, MD  REFERRING PROVIDER: Suanne Marker, MD  END OF SESSION:  PT End of Session - 02/08/24 1543     Visit Number 4    Number of Visits 13    Date for PT Re-Evaluation 03/24/24    Authorization Type [employer]    Authorization Time Period 01-24-24 - 03-20-24    Authorization - Visit Number 4    Authorization - Number of Visits 17    PT Start Time 0932    PT Stop Time 1018    PT Time Calculation (min) 46 min    Equipment Utilized During Treatment Gait belt    Activity Tolerance Patient tolerated treatment well    Behavior During Therapy WFL for tasks assessed/performed                Past Medical History:  Diagnosis Date   AAA (abdominal aortic aneurysm) (HCC)    a. s/p stent grafting in 2009.   Adenomatous colon polyp 05/2001   Adenomatous duodenal polyp    Allergy    Anginal pain (HCC)    COPD (chronic obstructive pulmonary disease) (HCC)    pt denies COPD   Coronary artery disease    a. s/p CABG in 1995;  b. 05/2013 Neg MV, EF 82%;  c. 06/2013 Cath: LM nl, LAD 40-50p, LCX nl, RCA 80p/153m, VG->RCA->PDA->PLSA min irregs, LIMA->LAD atretic, vigorous LV fxn.   Diabetes mellitus without complication (HCC)    history of, resolved. 2017   Diverticulosis    Eczema, dyshidrotic    Eczema, dyshidrotic 1986   Dr. Gerilyn Nestle   Emphysema of lung [facility])    Fatty liver    Fibromyalgia    GERD (gastroesophageal reflux disease)    Hyperlipidemia    Hypertension    Migraine    Myocardial infarction [facility])    Osteoarthritis    Osteoporosis    unsure, having a bone scan soon   Peripheral arterial disease (HCC)    left leg diagnosed by Dr Patty Sermons   Renal artery stenosis [facility])    a. 2008 s/p PTA   Past Surgical History:  Procedure Laterality Date   ABDOMINAL AORTAGRAM N/A  03/06/2014   Procedure: ABDOMINAL AORTAGRAM;  Surgeon: Larina Earthly, MD;  Location: [facility] CATH LAB;  Service: Cardiovascular;  Laterality: N/A;   ABDOMINAL AORTIC ANEURYSM REPAIR  2009   stenting   ABDOMINAL HYSTERECTOMY     Total, 1992   BREAST BIOPSY     CARDIAC CATHETERIZATION     CARDIOVASCULAR STRESS TEST  11/11/2009   normal study   CARPAL TUNNEL RELEASE  08/1993   left wrist   CARPAL TUNNEL RELEASE  01/1997   right   CATARACT EXTRACTION, BILATERAL  2021   CORONARY ARTERY BYPASS GRAFT  07/1994   Triple by Dr Ofilia Neas   CORONARY STENT INTERVENTION N/A 02/02/2021   Procedure: CORONARY STENT INTERVENTION;  Surgeon: Yvonne Kendall, MD;  Location: [facility] INVASIVE CV LAB;  Service: Cardiovascular;  Laterality: N/A;   CORONARY STENT INTERVENTION N/A 06/26/2021   Procedure: CORONARY STENT INTERVENTION;  Surgeon: Tonny Bollman, MD;  Location: [facility] INVASIVE CV LAB;  Service: Cardiovascular;  Laterality: N/A;   CORONARY ULTRASOUND/IVUS N/A 06/26/2021   Procedure: Intravascular Ultrasound/IVUS;  Surgeon: Tonny Bollman, MD;  Location: [facility] INVASIVE CV LAB;  Service: Cardiovascular;  Laterality: N/A;  CORONARY ULTRASOUND/IVUS N/A 09/03/2021   Procedure: Intravascular Ultrasound/IVUS;  Surgeon: Orbie Pyo, MD;  Location: [facility] INVASIVE CV LAB;  Service: Cardiovascular;  Laterality: N/A;   dental implants     EYE SURGERY  03/2020   cataract   HAND SURGERY Right 09/2002   Right hand pulley release   LAMINECTOMY WITH POSTERIOR LATERAL ARTHRODESIS LEVEL 1 N/A 03/24/2022   Procedure: LAMINECTOMY, FORAMINOTOMY, LUMBAR TWO- THREE ; INTERTRANSVERSE FUSION LUMBAR TWO - LUMBAR THREE RIGHT;  Surgeon: Tia Alert, MD;  Location: [facility] OR;  Service: Neurosurgery;  Laterality: N/A;   LEFT HEART CATH AND CORONARY ANGIOGRAPHY N/A 06/26/2021   Procedure: LEFT HEART CATH AND CORONARY ANGIOGRAPHY;  Surgeon: Tonny Bollman, MD;  Location: [facility] INVASIVE CV LAB;  Service: Cardiovascular;  Laterality: N/A;   LEFT HEART CATH  AND CORS/GRAFTS ANGIOGRAPHY N/A 02/02/2021   Procedure: LEFT HEART CATH AND CORS/GRAFTS ANGIOGRAPHY;  Surgeon: Yvonne Kendall, MD;  Location: [facility] INVASIVE CV LAB;  Service: Cardiovascular;  Laterality: N/A;   LEFT HEART CATH AND CORS/GRAFTS ANGIOGRAPHY N/A 09/03/2021   Procedure: LEFT HEART CATH AND CORS/GRAFTS ANGIOGRAPHY;  Surgeon: Orbie Pyo, MD;  Location: [facility] INVASIVE CV LAB;  Service: Cardiovascular;  Laterality: N/A;   LEFT HEART CATH AND CORS/GRAFTS ANGIOGRAPHY N/A 07/15/2022   Procedure: LEFT HEART CATH AND CORS/GRAFTS ANGIOGRAPHY;  Surgeon: Swazil[place], Peter M, MD;  Location: [facility] INVASIVE CV LAB;  Service: Cardiovascular;  Laterality: N/A;   LEFT HEART CATHETERIZATION WITH CORONARY/GRAFT ANGIOGRAM N/A 06/11/2013   Procedure: LEFT HEART CATHETERIZATION WITH Isabel Caprice;  Surgeon: Vesta Mixer, MD;  Location: [facility] CATH LAB;  Service: Cardiovascular;  Laterality: N/A;   MULTIPLE TOOTH EXTRACTIONS  2000   All upper teeth.  Has full dneture.    RENAL ARTERY STENT  2008   SHOULDER ARTHROSCOPY WITH ROTATOR CUFF REPAIR Right 09/27/2018   Procedure: Right shoulder mini open rotator cuff repair;  Surgeon: Jene Every, MD;  Location: [facility] ORS;  Service: Orthopedics;  Laterality: Right;  90 mins   thyroid cyst apiration  05/1997 and 07/1998   TONSILECTOMY, ADENOIDECTOMY, BILATERAL MYRINGOTOMY AND TUBES  1989   TONSILLECTOMY     TOTAL ABDOMINAL HYSTERECTOMY  1992   Patient Active Problem List   Diagnosis Date Noted   Oxygen dependent 04/29/2023   Centrilobular emphysema (HCC) 02/17/2023   Chronic respiratory failure (HCC) 02/17/2023   Pneumonia 02/17/2023   Senile purpura (HCC) 12/22/2022   Daily consumption of alcohol 12/22/2022   S/P lumbar laminectomy 03/24/2022   Thyroid nodule greater than or equal to 1.5 cm in diameter incidentally noted on imaging study 08/27/2021   Angina pectoris (HCC) 06/25/2021   History of compression fracture of spine - lumbar 06/19/2021   History of  lumbosacral spine surgery 03/03/2021   NSTEMI (non-ST elevated myocardial infarction) (HCC) 2022 01/31/2021   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/26/2021   Neuropathic pain 12/05/2020   Gait instability 09/09/2020   Lumbar stenosis without neurogenic claudication 02/26/2020   COPD with chronic bronchitis (HCC) 09/28/2018   Essential (primary) hypertension 07/10/2018   IFG (impaired fasting glucose) 11/10/2017   DJD (degenerative joint disease), lumbosacral 11/10/2017   Fibromyalgia 11/10/2017   Dyshidrotic eczema 11/10/2017   GERD (gastroesophageal reflux disease) 11/10/2017   Osteopenia 11/10/2017   Does use hearing aid 11/10/2017   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   Fatty liver 11/10/2017   Coronary artery disease    Peripheral vascular disease with claudication (HCC)    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   History of abdominal aortic aneurysm  repair 05/18/2011   History of renal artery stenosis 05/18/2011   Hyperlipidemia LDL goal <70     ONSET DATE: 12/14/2023  REFERRING DIAG: R53.1 (ICD-10-CM) - Weakness H55.00 (ICD-10-CM) - Nystagmus H81.4 (ICD-10-CM) - Central positional vertigo  THERAPY DIAG:  Muscle weakness (generalized)  Unsteadiness on feet  Rationale for Evaluation and Treatment: Rehabilitation  SUBJECTIVE:                                                                                                                                                                                             SUBJECTIVE STATEMENT:   Pt reports the exercises are making her very sore - is reconsidering doing aquatic therapy because she thinks this would enable her to do strengthening exercises and not have as much pain and soreness afterwards  Pt accompanied by: self  PERTINENT HISTORY: PMH: AAA, COPD, CAD, fibromyalgia, HLD, HTN, hx of MI, PAD, lumbar spinal surgery 03/24/22, history of severe spinal stenosis at L4-5 status post decompression and fusion from L3-L5 levels. Now  with worsening spinal stenosis at L2-3 level.   Per Dr. Marjory Lies: Gait difficulty likely related to hip flexor weakness associated with L2-3 spinal stenosis.   PAIN:  Are you having pain? Yes, in pain most of the time, has fibromyalgia, on oxycodone   PRECAUTIONS: Fall  There were no vitals filed for this visit.    FALLS: Has patient fallen in last 6 months? No  LIVING ENVIRONMENT: Lives with: lives alone, husband in a nursing house  Lives in: House/apartment Stairs: 3rd floor apartment, can use an elevator  Has following equipment at home: Single point cane, Environmental consultant - 4 wheeled, and Grab bars  PLOF: Independent and Independent with community mobility with device  PATIENT GOALS: Wants to work on her strength, re-assess for BPPV, working on some balance    OBJECTIVE:  Note: Objective measures were completed at Evaluation unless otherwise noted.  DIAGNOSTIC FINDINGS: 02/20/23: IMPRESSION:    Unremarkable MRI brain (with and without).  No acute findings.    COGNITION: Overall cognitive status: Within functional limits for tasks assessed   SENSATION: Light touch: WFL  POSTURE: forward head   LOWER EXTREMITY MMT:    MMT Right Eval Left Eval  Hip flexion 3 3  Hip extension    Hip abduction 4+ 4+  Hip adduction 4+ 4+  Hip internal rotation    Hip external rotation    Knee flexion 4- 4  Knee extension 5 5  Ankle dorsiflexion    Ankle plantarflexion    Ankle inversion    Ankle eversion    (Blank rows = not tested)  All tested in sitting    TRANSFERS: Assistive device utilized: None  Sit to stand: Modified independence Stand to sit: Modified independence    Performs with wide BOS   GAIT: Gait pattern: step through pattern Distance walked: Clinic distances  Assistive device utilized: Single point cane and None Level of assistance: Modified independence and SBA Comments: No overt unsteadiness noted, but some path deviation noted with no AD     FUNCTIONAL TESTS:  5 times sit to stand: 17.3 seconds with no UE support  30 seconds chair stand test: 9 sit <> stands with no UE support, pt reporting 4-5/10 RPE (<10 below normal for age) Gait speed: 12.4 seconds with no AD = 2.65 ft/sec     M-CTSIB  Condition 1: Firm Surface, EO 30 Sec, Normal Sway  Condition 2: Firm Surface, EC 30 Sec, Normal Sway  Condition 3: Foam Surface, EO 30 Sec, Mild Sway  Condition 4: Foam Surface, EC 30 Sec, Mild Sway                                                                                          TREATMENT DATE: 02-07-24  NeuroRe-ed:  Standing balance exercises performed inside // bars - on Airex - marching with EO 10 reps; marching with head turns horizontally 5 reps, vertically 5 reps with EO  Standing Balance: Surface: Airex Position: Narrow Base of Support Feet Hip Width Apart Completed with: Eyes Open and Eyes Closed; Head Turns x 5 Reps and Head Nods x 5 Reps  Foam balance beam - pt performed side stepping  4 reps inside // bars - for improved hip strategy - with UE support on // bar prn  Tandem walking 4 reps on blue foam beam for improved balance and use of ankle strategy - with UE support prn  Rockerboard - 10 reps with min. UE support with EO;  10 reps with UE support prn with EC  TherEx:  Added knee extension & flexion with red theraband to HEP Access Code: [email or web address] URL: [email or web address] Date: 02/08/2024 Prepared by: Maebelle Munroe  Exercises - Seated Hamstring Curl with Anchored Resistance  - 1 x daily - 7 x weekly - 3 sets - 10 reps - Sitting Knee Extension with Resistance  - 1 x daily - 7 x weekly - 3 sets - 10 reps  In supine position; Bridging x 5 reps - attempted to perform with ball between knees but this caused a cramp in Lt hamstring so ball was removed Bridging with marching 5 reps Bridging with LE extension 5 reps Bridging with hip abduction/adduction 5 reps  SLR RLE and LLE 10 reps -  no weight  Hip abduction - RLE and LLE - 10 reps each leg Clam shell exercise 10 reps each leg  HEP: Feet Together (Compliant Surface) Head Motion - Eyes Closed    Stand on compliant surface: __foam pad______ with feet together. Close eyes and move head slowly, up and down. Repeat __1__ times per session. Do __1__ sessions per day.          TherEx:   Performed standing hip flexion, abduction and extension with red theraband  10 reps each leg Performed standing hip flexion with knee flexed at 90 degrees 10 reps each leg with red theraband   issued Medbridge HEP for bil. LE strengthening - pt performed the following exercises 10 reps each  Access Code: [email or web address] URL: [email or web address] Date: 02/01/2024 Prepared by: Maebelle Munroe  Exercises - Bridge  - 1 x daily - 7 x weekly - 1 sets - 10 reps - 5 sec hold hold - Supine Active Straight Leg Raise  - 1 x daily - 7 x weekly - 3 sets - 10 reps - Sidelying Hip Abduction  - 1 x daily - 7 x weekly - 3 sets - 10 reps - Sit to Stand  - 1 x daily - 7 x weekly - 1 sets - 10 reps  PATIENT EDUCATION: Education details: Medbridge HEP Access Code: [email or web address]; added standing theraband exercises to HEP (red) Person educated: Patient Education method: Explanation, demonstration, handouts Education comprehension: verbalized understanding, demonstrated understanding  HOME EXERCISE PROGRAM: See above  GOALS: Goals reviewed with patient? Yes  SHORT TERM GOALS: Target date: 02/15/2024  Pt will be independent with initial HEP for strength/balance in order to build upon functional gains made in therapy. Baseline: Goal status: INITIAL  2.  Further vestibular assessment to be written with LTG updated Baseline:  Goal status: INITIAL  3.  FGA to be assessed with LTG written.  Baseline: score 21/30 on 02-02-24 Goal status: Goal met 02-02-24  4.  Pt will improve 5x sit<>stand to less than or equal to 15 sec to demonstrate improved  functional strength and transfer efficiency.   Baseline: 17.3 seconds with no UE support  Goal status: INITIAL    LONG TERM GOALS: Target date: 03/07/2024  Pt will be independent with final HEP for strength/balance in order to build upon functional gains made in therapy. Baseline:  Goal status: INITIAL  2.  FGA goal to be written as appropriate. Increase FGA score to >/= 26/30 to reduce fall risk and increase safety with ambulation. Baseline: score 21/30 on 02-02-24 Goal status: INITIAL  3.  Vestibular goal to be written as appropriate.  Baseline:  Goal status: INITIAL  4.  Pt will improve 30 second chair stand to at least 11 sit <> stands in order to demo above norm for age.   Baseline: 9 sit <> stands with no UE support Goal status: INITIAL  5.  Pt will improve gait speed with no AD vs. LRAD to at least 2.9 ft/sec in order to demo improved community mobility.   Baseline: 12.4 seconds with no AD = 2.65 ft/sec  Goal status: INITIAL    ASSESSMENT:  CLINICAL IMPRESSION: PT session focused on balance training with increased vestibular input incorporated and strengthening exercises for bil. LE strengthening.  Pt needed intermittent UE support with ambulating on foam balance beam inside // bars but did well overall.  Pt reported intermittent occurrence of cramping in Rt & Lt hamstrings with supine strengthening exercises but able to stretch LE and resume exercises.  Pt requests to trial aquatic exercise in order to have less pain/soreness compared to that experienced with land-based exercises.  Cont with POC.    OBJECTIVE IMPAIRMENTS: Abnormal gait, cardiopulmonary status limiting activity, decreased activity tolerance, decreased balance, decreased endurance, decreased mobility, difficulty walking, decreased strength, dizziness, postural dysfunction, and pain.   ACTIVITY LIMITATIONS: transfers and locomotion level  PARTICIPATION LIMITATIONS: community activity  PERSONAL FACTORS:  Age, Behavior pattern, Past/current experiences, Time since onset of injury/illness/exacerbation, and 3+ comorbidities: AAA, COPD,  CAD, fibromyalgia, HLD, HTN, hx of MI, PAD, lumbar spinal surgery 03/24/22, history of severe spinal stenosis at L4-5 status post decompression and fusion from L3-L5 levels. Now with worsening spinal stenosis at L2-3 level.   are also affecting patient's functional outcome.   REHAB POTENTIAL: Good  CLINICAL DECISION MAKING: Evolving/moderate complexity  EVALUATION COMPLEXITY: Moderate  PLAN:  PT FREQUENCY: 2x/week  PT DURATION: 8 weeks - anticipate 6 weeks   PLANNED INTERVENTIONS: 97164- PT Re-evaluation, 97110-Therapeutic exercises, 97530- Therapeutic activity, 97112- Neuromuscular re-education, 97535- Self Care, 16109- Manual therapy, [phone]- Gait training, [phone]- Canalith repositioning, [phone]- Aquatic Therapy, Patient/Family education, Balance training, Stair training, Vestibular training, and DME instructions  PLAN FOR NEXT SESSION: continue strengthening and balance exercises     Cheston Coury, Donavan Burnet, PT 02/08/2024, 3:46 PM

## 2024-02-08 ENCOUNTER — Encounter: Payer: Self-pay | Admitting: Physical Therapy

## 2024-02-09 ENCOUNTER — Encounter: Payer: Self-pay | Admitting: Physical Therapy

## 2024-02-09 ENCOUNTER — Ambulatory Visit: Payer: Medicare Other | Admitting: Physical Therapy

## 2024-02-09 VITALS — BP 153/71 | HR 87

## 2024-02-09 DIAGNOSIS — E785 Hyperlipidemia, unspecified: Secondary | ICD-10-CM | POA: Diagnosis not present

## 2024-02-09 DIAGNOSIS — Z95828 Presence of other vascular implants and grafts: Secondary | ICD-10-CM | POA: Diagnosis not present

## 2024-02-09 DIAGNOSIS — R2689 Other abnormalities of gait and mobility: Secondary | ICD-10-CM

## 2024-02-09 DIAGNOSIS — M6281 Muscle weakness (generalized): Secondary | ICD-10-CM | POA: Diagnosis not present

## 2024-02-09 DIAGNOSIS — R2681 Unsteadiness on feet: Secondary | ICD-10-CM | POA: Diagnosis not present

## 2024-02-09 DIAGNOSIS — I25708 Atherosclerosis of coronary artery bypass graft(s), unspecified, with other forms of angina pectoris: Secondary | ICD-10-CM | POA: Diagnosis not present

## 2024-02-09 DIAGNOSIS — I10 Essential (primary) hypertension: Secondary | ICD-10-CM | POA: Diagnosis not present

## 2024-02-09 DIAGNOSIS — E119 Type 2 diabetes mellitus without complications: Secondary | ICD-10-CM | POA: Diagnosis not present

## 2024-02-09 NOTE — Therapy (Signed)
 OUTPATIENT PHYSICAL THERAPY NEURO TREATMENT NOTE   Patient Name: Megan [name] MRN: [id number] DOB:1946/02/01, 78 y.o., female Today's Date: 02/09/2024   PCP: Willow Ora, MD  REFERRING PROVIDER: Suanne Marker, MD  END OF SESSION:  PT End of Session - 02/09/24 1040     Visit Number 5    Number of Visits 13    Date for PT Re-Evaluation 03/24/24    Authorization Type [employer]    Authorization Time Period 01-24-24 - 03-20-24    Authorization - Visit Number 5    Authorization - Number of Visits 17    PT Start Time 0929    PT Stop Time 1018    PT Time Calculation (min) 49 min    Equipment Utilized During Treatment Gait belt    Activity Tolerance Patient tolerated treatment well    Behavior During Therapy WFL for tasks assessed/performed                 Past Medical History:  Diagnosis Date   AAA (abdominal aortic aneurysm) (HCC)    a. s/p stent grafting in 2009.   Adenomatous colon polyp 05/2001   Adenomatous duodenal polyp    Allergy    Anginal pain (HCC)    COPD (chronic obstructive pulmonary disease) (HCC)    pt denies COPD   Coronary artery disease    a. s/p CABG in 1995;  b. 05/2013 Neg MV, EF 82%;  c. 06/2013 Cath: LM nl, LAD 40-50p, LCX nl, RCA 80p/187m, VG->RCA->PDA->PLSA min irregs, LIMA->LAD atretic, vigorous LV fxn.   Diabetes mellitus without complication (HCC)    history of, resolved. 2017   Diverticulosis    Eczema, dyshidrotic    Eczema, dyshidrotic 1986   Dr. Gerilyn Nestle   Emphysema of lung [facility])    Fatty liver    Fibromyalgia    GERD (gastroesophageal reflux disease)    Hyperlipidemia    Hypertension    Migraine    Myocardial infarction [facility])    Osteoarthritis    Osteoporosis    unsure, having a bone scan soon   Peripheral arterial disease (HCC)    left leg diagnosed by Dr Patty Sermons   Renal artery stenosis [facility])    a. 2008 s/p PTA   Past Surgical History:  Procedure Laterality Date   ABDOMINAL AORTAGRAM N/A  03/06/2014   Procedure: ABDOMINAL AORTAGRAM;  Surgeon: Larina Earthly, MD;  Location: [facility] CATH LAB;  Service: Cardiovascular;  Laterality: N/A;   ABDOMINAL AORTIC ANEURYSM REPAIR  2009   stenting   ABDOMINAL HYSTERECTOMY     Total, 1992   BREAST BIOPSY     CARDIAC CATHETERIZATION     CARDIOVASCULAR STRESS TEST  11/11/2009   normal study   CARPAL TUNNEL RELEASE  08/1993   left wrist   CARPAL TUNNEL RELEASE  01/1997   right   CATARACT EXTRACTION, BILATERAL  2021   CORONARY ARTERY BYPASS GRAFT  07/1994   Triple by Dr Ofilia Neas   CORONARY STENT INTERVENTION N/A 02/02/2021   Procedure: CORONARY STENT INTERVENTION;  Surgeon: Yvonne Kendall, MD;  Location: [facility] INVASIVE CV LAB;  Service: Cardiovascular;  Laterality: N/A;   CORONARY STENT INTERVENTION N/A 06/26/2021   Procedure: CORONARY STENT INTERVENTION;  Surgeon: Tonny Bollman, MD;  Location: [facility] INVASIVE CV LAB;  Service: Cardiovascular;  Laterality: N/A;   CORONARY ULTRASOUND/IVUS N/A 06/26/2021   Procedure: Intravascular Ultrasound/IVUS;  Surgeon: Tonny Bollman, MD;  Location: [facility] INVASIVE CV LAB;  Service: Cardiovascular;  Laterality: N/A;  CORONARY ULTRASOUND/IVUS N/A 09/03/2021   Procedure: Intravascular Ultrasound/IVUS;  Surgeon: Orbie Pyo, MD;  Location: [facility] INVASIVE CV LAB;  Service: Cardiovascular;  Laterality: N/A;   dental implants     EYE SURGERY  03/2020   cataract   HAND SURGERY Right 09/2002   Right hand pulley release   LAMINECTOMY WITH POSTERIOR LATERAL ARTHRODESIS LEVEL 1 N/A 03/24/2022   Procedure: LAMINECTOMY, FORAMINOTOMY, LUMBAR TWO- THREE ; INTERTRANSVERSE FUSION LUMBAR TWO - LUMBAR THREE RIGHT;  Surgeon: Tia Alert, MD;  Location: [facility] OR;  Service: Neurosurgery;  Laterality: N/A;   LEFT HEART CATH AND CORONARY ANGIOGRAPHY N/A 06/26/2021   Procedure: LEFT HEART CATH AND CORONARY ANGIOGRAPHY;  Surgeon: Tonny Bollman, MD;  Location: [facility] INVASIVE CV LAB;  Service: Cardiovascular;  Laterality: N/A;   LEFT HEART CATH  AND CORS/GRAFTS ANGIOGRAPHY N/A 02/02/2021   Procedure: LEFT HEART CATH AND CORS/GRAFTS ANGIOGRAPHY;  Surgeon: Yvonne Kendall, MD;  Location: [facility] INVASIVE CV LAB;  Service: Cardiovascular;  Laterality: N/A;   LEFT HEART CATH AND CORS/GRAFTS ANGIOGRAPHY N/A 09/03/2021   Procedure: LEFT HEART CATH AND CORS/GRAFTS ANGIOGRAPHY;  Surgeon: Orbie Pyo, MD;  Location: [facility] INVASIVE CV LAB;  Service: Cardiovascular;  Laterality: N/A;   LEFT HEART CATH AND CORS/GRAFTS ANGIOGRAPHY N/A 07/15/2022   Procedure: LEFT HEART CATH AND CORS/GRAFTS ANGIOGRAPHY;  Surgeon: [place], Peter M, MD;  Location: [facility] INVASIVE CV LAB;  Service: Cardiovascular;  Laterality: N/A;   LEFT HEART CATHETERIZATION WITH CORONARY/GRAFT ANGIOGRAM N/A 06/11/2013   Procedure: LEFT HEART CATHETERIZATION WITH Isabel Caprice;  Surgeon: Vesta Mixer, MD;  Location: [facility] CATH LAB;  Service: Cardiovascular;  Laterality: N/A;   MULTIPLE TOOTH EXTRACTIONS  2000   All upper teeth.  Has full dneture.    RENAL ARTERY STENT  2008   SHOULDER ARTHROSCOPY WITH ROTATOR CUFF REPAIR Right 09/27/2018   Procedure: Right shoulder mini open rotator cuff repair;  Surgeon: Jene Every, MD;  Location: [facility] ORS;  Service: Orthopedics;  Laterality: Right;  90 mins   thyroid cyst apiration  05/1997 and 07/1998   TONSILECTOMY, ADENOIDECTOMY, BILATERAL MYRINGOTOMY AND TUBES  1989   TONSILLECTOMY     TOTAL ABDOMINAL HYSTERECTOMY  1992   Patient Active Problem List   Diagnosis Date Noted   Oxygen dependent 04/29/2023   Centrilobular emphysema (HCC) 02/17/2023   Chronic respiratory failure (HCC) 02/17/2023   Pneumonia 02/17/2023   Senile purpura (HCC) 12/22/2022   Daily consumption of alcohol 12/22/2022   S/P lumbar laminectomy 03/24/2022   Thyroid nodule greater than or equal to 1.5 cm in diameter incidentally noted on imaging study 08/27/2021   Angina pectoris (HCC) 06/25/2021   History of compression fracture of spine - lumbar 06/19/2021   History of  lumbosacral spine surgery 03/03/2021   NSTEMI (non-ST elevated myocardial infarction) (HCC) 2022 01/31/2021   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/26/2021   Neuropathic pain 12/05/2020   Gait instability 09/09/2020   Lumbar stenosis without neurogenic claudication 02/26/2020   COPD with chronic bronchitis (HCC) 09/28/2018   Essential (primary) hypertension 07/10/2018   IFG (impaired fasting glucose) 11/10/2017   DJD (degenerative joint disease), lumbosacral 11/10/2017   Fibromyalgia 11/10/2017   Dyshidrotic eczema 11/10/2017   GERD (gastroesophageal reflux disease) 11/10/2017   Osteopenia 11/10/2017   Does use hearing aid 11/10/2017   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   Fatty liver 11/10/2017   Coronary artery disease    Peripheral vascular disease with claudication (HCC)    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   History of abdominal aortic aneurysm  repair 05/18/2011   History of renal artery stenosis 05/18/2011   Hyperlipidemia LDL goal <70     ONSET DATE: 12/14/2023  REFERRING DIAG: R53.1 (ICD-10-CM) - Weakness H55.00 (ICD-10-CM) - Nystagmus H81.4 (ICD-10-CM) - Central positional vertigo  THERAPY DIAG:  Muscle weakness (generalized)  Unsteadiness on feet  Other abnormalities of gait and mobility  Rationale for Evaluation and Treatment: Rehabilitation  SUBJECTIVE:                                                                                                                                                                                             SUBJECTIVE STATEMENT:   Pt reports she continues to have generalized soreness all over but states she did walk on the treadmill this morning even though she was sore.  Did the new exercises added to HEP - knee flexion & extension with red theraband and did fine with them.  Answered questions regarding aquatic therapy scheduled next week.  Pt accompanied by: self  PERTINENT HISTORY: PMH: AAA, COPD, CAD, fibromyalgia, HLD, HTN,  hx of MI, PAD, lumbar spinal surgery 03/24/22, history of severe spinal stenosis at L4-5 status post decompression and fusion from L3-L5 levels. Now with worsening spinal stenosis at L2-3 level.   Per Dr. Marjory Lies: Gait difficulty likely related to hip flexor weakness associated with L2-3 spinal stenosis.   PAIN:  Are you having pain? Yes, in pain most of the time, has fibromyalgia, on oxycodone - 6/10 generalized pain; on oxycodone and aspirin  PRECAUTIONS: Fall   FALLS: Has patient fallen in last 6 months? No  LIVING ENVIRONMENT: Lives with: lives alone, husband in a nursing house  Lives in: House/apartment Stairs: 3rd floor apartment, can use an elevator  Has following equipment at home: Single point cane, Environmental consultant - 4 wheeled, and Grab bars  PLOF: Independent and Independent with community mobility with device  PATIENT GOALS: Wants to work on her strength, re-assess for BPPV, working on some balance    OBJECTIVE:  Note: Objective measures were completed at Evaluation unless otherwise noted.  DIAGNOSTIC FINDINGS: 02/20/23: IMPRESSION:    Unremarkable MRI brain (with and without).  No acute findings.    COGNITION: Overall cognitive status: Within functional limits for tasks assessed   SENSATION: Light touch: WFL  POSTURE: forward head   LOWER EXTREMITY MMT:    MMT Right Eval Left Eval  Hip flexion 3 3  Hip extension    Hip abduction 4+ 4+  Hip adduction 4+ 4+  Hip internal rotation    Hip external rotation    Knee flexion 4- 4  Knee extension 5 5  Ankle dorsiflexion  Ankle plantarflexion    Ankle inversion    Ankle eversion    (Blank rows = not tested)  All tested in sitting    TRANSFERS: Assistive device utilized: None  Sit to stand: Modified independence Stand to sit: Modified independence    Performs with wide BOS   GAIT: Gait pattern: step through pattern Distance walked: Clinic distances  Assistive device utilized: Single point cane and  None Level of assistance: Modified independence and SBA Comments: No overt unsteadiness noted, but some path deviation noted with no AD    FUNCTIONAL TESTS:  5 times sit to stand: 17.3 seconds with no UE support  30 seconds chair stand test: 9 sit <> stands with no UE support, pt reporting 4-5/10 RPE (<10 below normal for age) Gait speed: 12.4 seconds with no AD = 2.65 ft/sec     M-CTSIB  Condition 1: Firm Surface, EO 30 Sec, Normal Sway  Condition 2: Firm Surface, EC 30 Sec, Normal Sway  Condition 3: Foam Surface, EO 30 Sec, Mild Sway  Condition 4: Foam Surface, EC 30 Sec, Mild Sway                                                                                          TREATMENT DATE: 02-09-23  Vitals:   02/09/24 1002  BP: (!) 153/71  Pulse: 87   NeuroRe-ed:  Standing balance exercises performed on Airex - marching with EO 10 reps; marching with head turns horizontally 5 reps, vertically 5 reps with EO Pt stood on Airex with feet together - EO for 30 secs:  with EC 30 secs - mild postural sway but no LOB Pt performed marching on Airex - EO 10 reps- cues to perform slowly to facilitate improved SLS on each leg Stepping down to floor and back up onto Airex 5 reps each leg  TherEx: Runner's stretch bil. LE's 20 sec x 1 rep each leg; bil. Heel cord stretch with heels off edge of step 10 sec hold x 1 rep Leg press - seat at 12; 50# 3 sets 10 reps with cues to perform slowly for controlled return for eccentric strengthening   TherAct:  Step up exercise onto 6" step without UE support - 10 reps each leg Tapping 1st step 5 reps each leg with 2# weight placed on each leg for hip flexor strengthening; 10 reps tapping 2nd step with 2# weight on each leg with UE support prn Stepping laterally over/back of blue foam balance beam with min to mod assist for recovery of LOB Pt stood on inverted Bosu inside // bars with bil. UE support - performed lateral weight shifts 10 reps and then  anterior/posterio weight shifts 10 reps Pt stood on RLE placed in middle of base of inverted Bosu - moved LLE slowly forward/back and out/in 10 reps each direction for improved SLS and closed chain strengthening with UE support on // bars:  performed same exercise standing on LLE - moving RLE 10 reps forward/back and out/in Small range squats 10 reps with bil. UE support Pt performed static standing balance on Bosu - lifted each UE unilaterally with UE support of  contralateral UE support 5 reps each; progressed to bil. UE flexion to 90 degrees 5 reps with CGA for core stabilization and for improved standing balance on compliant surface  Gait: Pt amb. 35' x 2 reps with horizontal head turns with SBA  - no LOB Amb. 35' x 2 reps with vertical head turns with SBA - slightly increased deviation in path compared to amb. With horizontal head turns but no major LOB       Added knee extension & flexion with red theraband to HEP (02-07-24) Access Code: [email or web address] URL: [email or web address] Date: 02/08/2024 Prepared by: Maebelle Munroe  Exercises - Seated Hamstring Curl with Anchored Resistance  - 1 x daily - 7 x weekly - 3 sets - 10 reps - Sitting Knee Extension with Resistance  - 1 x daily - 7 x weekly - 3 sets - 10 reps  In supine position; Bridging x 5 reps - attempted to perform with ball between knees but this caused a cramp in Lt hamstring so ball was removed Bridging with marching 5 reps Bridging with LE extension 5 reps Bridging with hip abduction/adduction 5 reps  SLR RLE and LLE 10 reps - no weight  Hip abduction - RLE and LLE - 10 reps each leg Clam shell exercise 10 reps each leg  HEP: Feet Together (Compliant Surface) Head Motion - Eyes Closed    Stand on compliant surface: __foam pad______ with feet together. Close eyes and move head slowly, up and down. Repeat __1__ times per session. Do __1__ sessions per day.          TherEx:   Performed standing hip  flexion, abduction and extension with red theraband 10 reps each leg Performed standing hip flexion with knee flexed at 90 degrees 10 reps each leg with red theraband   issued Medbridge HEP for bil. LE strengthening - pt performed the following exercises 10 reps each  Access Code: [email or web address] URL: [email or web address] Date: 02/01/2024 Prepared by: Maebelle Munroe  Exercises - Bridge  - 1 x daily - 7 x weekly - 1 sets - 10 reps - 5 sec hold hold - Supine Active Straight Leg Raise  - 1 x daily - 7 x weekly - 3 sets - 10 reps - Sidelying Hip Abduction  - 1 x daily - 7 x weekly - 3 sets - 10 reps - Sit to Stand  - 1 x daily - 7 x weekly - 1 sets - 10 reps  PATIENT EDUCATION: Education details: Medbridge HEP Access Code: [email or web address]; added standing theraband exercises to HEP (red) Person educated: Patient Education method: Explanation, demonstration, handouts Education comprehension: verbalized understanding, demonstrated understanding  HOME EXERCISE PROGRAM: See above  GOALS: Goals reviewed with patient? Yes  SHORT TERM GOALS: Target date: 02/15/2024  Pt will be independent with initial HEP for strength/balance in order to build upon functional gains made in therapy. Baseline: Goal status: INITIAL  2.  Further vestibular assessment to be written with LTG updated Baseline:  Goal status: INITIAL  3.  FGA to be assessed with LTG written.  Baseline: score 21/30 on 02-02-24 Goal status: Goal met 02-02-24  4.  Pt will improve 5x sit<>stand to less than or equal to 15 sec to demonstrate improved functional strength and transfer efficiency.   Baseline: 17.3 seconds with no UE support  Goal status: INITIAL    LONG TERM GOALS: Target date: 03/07/2024  Pt will be independent with final HEP for strength/balance in order to build upon functional gains made in therapy.  Baseline:  Goal status: INITIAL  2.  FGA goal to be written as appropriate. Increase FGA score to >/= 26/30 to  reduce fall risk and increase safety with ambulation. Baseline: score 21/30 on 02-02-24 Goal status: INITIAL  3.  Vestibular goal to be written as appropriate.  Baseline:  Goal status: INITIAL  4.  Pt will improve 30 second chair stand to at least 11 sit <> stands in order to demo above norm for age.   Baseline: 9 sit <> stands with no UE support Goal status: INITIAL  5.  Pt will improve gait speed with no AD vs. LRAD to at least 2.9 ft/sec in order to demo improved community mobility.   Baseline: 12.4 seconds with no AD = 2.65 ft/sec  Goal status: INITIAL    ASSESSMENT:  CLINICAL IMPRESSION: PT session focused on balance training with increased vestibular input incorporated and LE strengthening exercises.  Pt reports greater difficulty with ambulating with vertical head turns than with ambulating with horizontal head turns, but pt had no LOB with amb. With vertical head turns.  Pt appears to have >SLS deficit on RLE than on LLE.  Pt able to stand on Airex for 30 secs with EC with no significant postural sway, demonstrating increased vestibular input in maintaining balance.  Cont with POC.    OBJECTIVE IMPAIRMENTS: Abnormal gait, cardiopulmonary status limiting activity, decreased activity tolerance, decreased balance, decreased endurance, decreased mobility, difficulty walking, decreased strength, dizziness, postural dysfunction, and pain.   ACTIVITY LIMITATIONS: transfers and locomotion level  PARTICIPATION LIMITATIONS: community activity  PERSONAL FACTORS: Age, Behavior pattern, Past/current experiences, Time since onset of injury/illness/exacerbation, and 3+ comorbidities: AAA, COPD, CAD, fibromyalgia, HLD, HTN, hx of MI, PAD, lumbar spinal surgery 03/24/22, history of severe spinal stenosis at L4-5 status post decompression and fusion from L3-L5 levels. Now with worsening spinal stenosis at L2-3 level.   are also affecting patient's functional outcome.   REHAB POTENTIAL:  Good  CLINICAL DECISION MAKING: Evolving/moderate complexity  EVALUATION COMPLEXITY: Moderate  PLAN:  PT FREQUENCY: 2x/week  PT DURATION: 8 weeks - anticipate 6 weeks   PLANNED INTERVENTIONS: 97164- PT Re-evaluation, 97110-Therapeutic exercises, 97530- Therapeutic activity, 97112- Neuromuscular re-education, 97535- Self Care, 69629- Manual therapy, [phone]- Gait training, [phone]- Canalith repositioning, [phone]- Aquatic Therapy, Patient/Family education, Balance training, Stair training, Vestibular training, and DME instructions  PLAN FOR NEXT SESSION:  Check STG's;  continue strengthening and balance exercises - pt is scheduled for aquatic PT on 02-15-24     Kary Kos, PT 02/09/2024, 11:52 AM

## 2024-02-10 ENCOUNTER — Encounter (HOSPITAL_BASED_OUTPATIENT_CLINIC_OR_DEPARTMENT_OTHER): Payer: Self-pay

## 2024-02-10 LAB — BASIC METABOLIC PANEL
BUN/Creatinine Ratio: 13 (ref 12–28)
BUN: 9 mg/dL (ref 8–27)
CO2: 25 mmol/L (ref 20–29)
Calcium: 10 mg/dL (ref 8.7–10.3)
Chloride: 105 mmol/L (ref 96–106)
Creatinine, Ser: 0.72 mg/dL (ref 0.57–1.00)
Glucose: 94 mg/dL (ref 70–99)
Potassium: 4.7 mmol/L (ref 3.5–5.2)
Sodium: 141 mmol/L (ref 134–144)
eGFR: 85 mL/min/{1.73_m2} (ref >59)

## 2024-02-10 LAB — HEPATIC FUNCTION PANEL
ALT: 16 IU/L (ref 0–32)
AST: 22 IU/L (ref 0–40)
Albumin: 4 g/dL (ref 3.8–4.8)
Alkaline Phosphatase: 93 IU/L (ref 44–121)
Bilirubin Total: 0.8 mg/dL (ref 0.0–1.2)
Bilirubin, Direct: 0.32 mg/dL (ref 0.00–0.40)
Total Protein: 5.6 g/dL — ABNORMAL LOW (ref 6.0–8.5)

## 2024-02-10 LAB — LIPID PANEL
Chol/HDL Ratio: 2.3 ratio (ref 0.0–4.4)
Cholesterol, Total: 122 mg/dL (ref 100–199)
HDL: 53 mg/dL (ref >39)
LDL Chol Calc (NIH): 49 mg/dL (ref 0–99)
Triglycerides: 107 mg/dL (ref 0–149)
VLDL Cholesterol Cal: 20 mg/dL (ref 5–40)

## 2024-02-10 LAB — HEMOGLOBIN A1C
Est. average glucose Bld gHb Est-mCnc: 126 mg/dL
Hgb A1c MFr Bld: 6 % — ABNORMAL HIGH (ref 4.8–5.6)

## 2024-02-13 ENCOUNTER — Encounter: Payer: Self-pay | Admitting: Cardiology

## 2024-02-13 ENCOUNTER — Ambulatory Visit: Payer: Medicare Other | Attending: Cardiology | Admitting: Cardiology

## 2024-02-13 VITALS — BP 120/58 | HR 68 | Ht 59.0 in | Wt 132.6 lb

## 2024-02-13 DIAGNOSIS — E785 Hyperlipidemia, unspecified: Secondary | ICD-10-CM | POA: Diagnosis not present

## 2024-02-13 DIAGNOSIS — E119 Type 2 diabetes mellitus without complications: Secondary | ICD-10-CM

## 2024-02-13 DIAGNOSIS — I10 Essential (primary) hypertension: Secondary | ICD-10-CM

## 2024-02-13 DIAGNOSIS — I25708 Atherosclerosis of coronary artery bypass graft(s), unspecified, with other forms of angina pectoris: Secondary | ICD-10-CM | POA: Diagnosis not present

## 2024-02-13 NOTE — Patient Instructions (Addendum)
 Medication Instructions:  Continue same medications *If you need a refill on your cardiac medications before your next appointment, please call your pharmacy*   Lab Work: Cmet,lipid panel,cbc,A1c have done in 6 months I week before appointment   Testing/Procedures: None ordered   Follow-Up: At [facility], you and your health needs are our priority.  As part of our continuing mission to provide you with exceptional heart care, we have created designated Provider Care Teams.  These Care Teams include your primary Cardiologist (physician) and Advanced Practice Providers (APPs -  Physician Assistants and Nurse Practitioners) who all work together to provide you with the care you need, when you need it.  We recommend signing up for the patient portal called "MyChart".  Sign up information is provided on this After Visit Summary.  MyChart is used to connect with patients for Virtual Visits (Telemedicine).  Patients are able to view lab/test results, encounter notes, upcoming appointments, etc.  Non-urgent messages can be sent to your provider as well.   To learn more about what you can do with MyChart, go to [email or web address].    Your next appointment:  6 months    Call in April to schedule August appointment     Provider:  Dr.Jordan

## 2024-02-14 ENCOUNTER — Ambulatory Visit: Payer: Medicare Other | Admitting: Physical Therapy

## 2024-02-14 ENCOUNTER — Encounter: Payer: Self-pay | Admitting: Physical Therapy

## 2024-02-14 DIAGNOSIS — R2689 Other abnormalities of gait and mobility: Secondary | ICD-10-CM

## 2024-02-14 DIAGNOSIS — M6281 Muscle weakness (generalized): Secondary | ICD-10-CM

## 2024-02-14 DIAGNOSIS — R2681 Unsteadiness on feet: Secondary | ICD-10-CM

## 2024-02-14 NOTE — Therapy (Signed)
 OUTPATIENT PHYSICAL THERAPY NEURO TREATMENT NOTE   Patient Name: Megan [name] MRN: [id number] DOB:04-Jul-1946, 78 y.o., female Today's Date: 02/14/2024   PCP: Willow Ora, MD  REFERRING PROVIDER: Suanne Marker, MD  END OF SESSION:  PT End of Session - 02/14/24 0847     Visit Number 6    Number of Visits 13    Date for PT Re-Evaluation 03/24/24    Authorization Type [employer]    Authorization Time Period 01-24-24 - 03-20-24    Authorization - Visit Number 6    Authorization - Number of Visits 17    PT Start Time 0847    PT Stop Time 0928    PT Time Calculation (min) 41 min    Equipment Utilized During Treatment Gait belt    Activity Tolerance Patient tolerated treatment well    Behavior During Therapy WFL for tasks assessed/performed                 Past Medical History:  Diagnosis Date   AAA (abdominal aortic aneurysm) (HCC)    a. s/p stent grafting in 2009.   Adenomatous colon polyp 05/2001   Adenomatous duodenal polyp    Allergy    Anginal pain (HCC)    COPD (chronic obstructive pulmonary disease) (HCC)    pt denies COPD   Coronary artery disease    a. s/p CABG in 1995;  b. 05/2013 Neg MV, EF 82%;  c. 06/2013 Cath: LM nl, LAD 40-50p, LCX nl, RCA 80p/19m, VG->RCA->PDA->PLSA min irregs, LIMA->LAD atretic, vigorous LV fxn.   Diabetes mellitus without complication (HCC)    history of, resolved. 2017   Diverticulosis    Eczema, dyshidrotic    Eczema, dyshidrotic 1986   Dr. Gerilyn Nestle   Emphysema of lung [facility])    Fatty liver    Fibromyalgia    GERD (gastroesophageal reflux disease)    Hyperlipidemia    Hypertension    Migraine    Myocardial infarction [facility])    Osteoarthritis    Osteoporosis    unsure, having a bone scan soon   Peripheral arterial disease (HCC)    left leg diagnosed by Dr Patty Sermons   Renal artery stenosis [facility])    a. 2008 s/p PTA   Past Surgical History:  Procedure Laterality Date   ABDOMINAL AORTAGRAM N/A  03/06/2014   Procedure: ABDOMINAL AORTAGRAM;  Surgeon: Larina Earthly, MD;  Location: [facility] CATH LAB;  Service: Cardiovascular;  Laterality: N/A;   ABDOMINAL AORTIC ANEURYSM REPAIR  2009   stenting   ABDOMINAL HYSTERECTOMY     Total, 1992   BREAST BIOPSY     CARDIAC CATHETERIZATION     CARDIOVASCULAR STRESS TEST  11/11/2009   normal study   CARPAL TUNNEL RELEASE  08/1993   left wrist   CARPAL TUNNEL RELEASE  01/1997   right   CATARACT EXTRACTION, BILATERAL  2021   CORONARY ARTERY BYPASS GRAFT  07/1994   Triple by Dr Ofilia Neas   CORONARY STENT INTERVENTION N/A 02/02/2021   Procedure: CORONARY STENT INTERVENTION;  Surgeon: Yvonne Kendall, MD;  Location: [facility] INVASIVE CV LAB;  Service: Cardiovascular;  Laterality: N/A;   CORONARY STENT INTERVENTION N/A 06/26/2021   Procedure: CORONARY STENT INTERVENTION;  Surgeon: Tonny Bollman, MD;  Location: [facility] INVASIVE CV LAB;  Service: Cardiovascular;  Laterality: N/A;   CORONARY ULTRASOUND/IVUS N/A 06/26/2021   Procedure: Intravascular Ultrasound/IVUS;  Surgeon: Tonny Bollman, MD;  Location: [facility] INVASIVE CV LAB;  Service: Cardiovascular;  Laterality: N/A;  CORONARY ULTRASOUND/IVUS N/A 09/03/2021   Procedure: Intravascular Ultrasound/IVUS;  Surgeon: Orbie Pyo, MD;  Location: [facility] INVASIVE CV LAB;  Service: Cardiovascular;  Laterality: N/A;   dental implants     EYE SURGERY  03/2020   cataract   HAND SURGERY Right 09/2002   Right hand pulley release   LAMINECTOMY WITH POSTERIOR LATERAL ARTHRODESIS LEVEL 1 N/A 03/24/2022   Procedure: LAMINECTOMY, FORAMINOTOMY, LUMBAR TWO- THREE ; INTERTRANSVERSE FUSION LUMBAR TWO - LUMBAR THREE RIGHT;  Surgeon: Tia Alert, MD;  Location[facility] OR;  Service: Neurosurgery;  Laterality: N/A;   LEFT HEART CATH AND CORONARY ANGIOGRAPHY N/A 06/26/2021   Procedure: LEFT HEART CATH AND CORONARY ANGIOGRAPHY;  Surgeon: Tonny Bollman, MD;  Location: [facility] INVASIVE CV LAB;  Service: Cardiovascular;  Laterality: N/A;   LEFT HEART CATH  AND CORS/GRAFTS ANGIOGRAPHY N/A 02/02/2021   Procedure: LEFT HEART CATH AND CORS/GRAFTS ANGIOGRAPHY;  Surgeon: Yvonne Kendall, MD;  Location: [facility] INVASIVE CV LAB;  Service: Cardiovascular;  Laterality: N/A;   LEFT HEART CATH AND CORS/GRAFTS ANGIOGRAPHY N/A 09/03/2021   Procedure: LEFT HEART CATH AND CORS/GRAFTS ANGIOGRAPHY;  Surgeon: Orbie Pyo, MD;  Location: [facility] INVASIVE CV LAB;  Service: Cardiovascular;  Laterality: N/A;   LEFT HEART CATH AND CORS/GRAFTS ANGIOGRAPHY N/A 07/15/2022   Procedure: LEFT HEART CATH AND CORS/GRAFTS ANGIOGRAPHY;  Surgeon: Swazil[place], Peter M, MD;  Location: [facility] INVASIVE CV LAB;  Service: Cardiovascular;  Laterality: N/A;   LEFT HEART CATHETERIZATION WITH CORONARY/GRAFT ANGIOGRAM N/A 06/11/2013   Procedure: LEFT HEART CATHETERIZATION WITH Isabel Caprice;  Surgeon: Vesta Mixer, MD;  Location: [facility] CATH LAB;  Service: Cardiovascular;  Laterality: N/A;   MULTIPLE TOOTH EXTRACTIONS  2000   All upper teeth.  Has full dneture.    RENAL ARTERY STENT  2008   SHOULDER ARTHROSCOPY WITH ROTATOR CUFF REPAIR Right 09/27/2018   Procedure: Right shoulder mini open rotator cuff repair;  Surgeon: Jene Every, MD;  Location: [facility] ORS;  Service: Orthopedics;  Laterality: Right;  90 mins   thyroid cyst apiration  05/1997 and 07/1998   TONSILECTOMY, ADENOIDECTOMY, BILATERAL MYRINGOTOMY AND TUBES  1989   TONSILLECTOMY     TOTAL ABDOMINAL HYSTERECTOMY  1992   Patient Active Problem List   Diagnosis Date Noted   Oxygen dependent 04/29/2023   Centrilobular emphysema (HCC) 02/17/2023   Chronic respiratory failure (HCC) 02/17/2023   Pneumonia 02/17/2023   Senile purpura (HCC) 12/22/2022   Daily consumption of alcohol 12/22/2022   S/P lumbar laminectomy 03/24/2022   Thyroid nodule greater than or equal to 1.5 cm in diameter incidentally noted on imaging study 08/27/2021   Angina pectoris (HCC) 06/25/2021   History of compression fracture of spine - lumbar 06/19/2021   History of  lumbosacral spine surgery 03/03/2021   NSTEMI (non-ST elevated myocardial infarction) (HCC) 2022 01/31/2021   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/26/2021   Neuropathic pain 12/05/2020   Gait instability 09/09/2020   Lumbar stenosis without neurogenic claudication 02/26/2020   COPD with chronic bronchitis (HCC) 09/28/2018   Essential (primary) hypertension 07/10/2018   IFG (impaired fasting glucose) 11/10/2017   DJD (degenerative joint disease), lumbosacral 11/10/2017   Fibromyalgia 11/10/2017   Dyshidrotic eczema 11/10/2017   GERD (gastroesophageal reflux disease) 11/10/2017   Osteopenia 11/10/2017   Does use hearing aid 11/10/2017   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   Fatty liver 11/10/2017   Coronary artery disease    Peripheral vascular disease with claudication (HCC)    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   History of abdominal aortic aneurysm  repair 05/18/2011   History of renal artery stenosis 05/18/2011   Hyperlipidemia LDL goal <70     ONSET DATE: 12/14/2023  REFERRING DIAG: R53.1 (ICD-10-CM) - Weakness H55.00 (ICD-10-CM) - Nystagmus H81.4 (ICD-10-CM) - Central positional vertigo  THERAPY DIAG:  Muscle weakness (generalized)  Unsteadiness on feet  Other abnormalities of gait and mobility  Rationale for Evaluation and Treatment: Rehabilitation  SUBJECTIVE:                                                                                                                                                                                             SUBJECTIVE STATEMENT:  Will be going to aquatic therapy tomorrow. Been working on the exercises, has been feeling a little sore. No falls.  Pt accompanied by: self  PERTINENT HISTORY: PMH: AAA, COPD, CAD, fibromyalgia, HLD, HTN, hx of MI, PAD, lumbar spinal surgery 03/24/22, history of severe spinal stenosis at L4-5 status post decompression and fusion from L3-L5 levels. Now with worsening spinal stenosis at L2-3 level.   Per  Dr. Marjory Lies: Gait difficulty likely related to hip flexor weakness associated with L2-3 spinal stenosis.   PAIN:  Are you having pain? Yes, in pain most of the time, has fibromyalgia, on oxycodone - 6/10 generalized pain; on oxycodone and aspirin  PRECAUTIONS: Fall   FALLS: Has patient fallen in last 6 months? No  LIVING ENVIRONMENT: Lives with: lives alone, husband in a nursing house  Lives in: House/apartment Stairs: 3rd floor apartment, can use an elevator  Has following equipment at home: Single point cane, Environmental consultant - 4 wheeled, and Grab bars  PLOF: Independent and Independent with community mobility with device  PATIENT GOALS: Wants to work on her strength, re-assess for BPPV, working on some balance    OBJECTIVE:  Note: Objective measures were completed at Evaluation unless otherwise noted.  DIAGNOSTIC FINDINGS: 02/20/23: IMPRESSION:    Unremarkable MRI brain (with and without).  No acute findings.    COGNITION: Overall cognitive status: Within functional limits for tasks assessed   SENSATION: Light touch: WFL  POSTURE: forward head   LOWER EXTREMITY MMT:    MMT Right Eval Left Eval  Hip flexion 3 3  Hip extension    Hip abduction 4+ 4+  Hip adduction 4+ 4+  Hip internal rotation    Hip external rotation    Knee flexion 4- 4  Knee extension 5 5  Ankle dorsiflexion    Ankle plantarflexion    Ankle inversion    Ankle eversion    (Blank rows = not tested)  All tested in sitting    TRANSFERS: Assistive device utilized: None  Sit to stand: Modified independence Stand to sit: Modified independence    Performs with wide BOS   GAIT: Gait pattern: step through pattern Distance walked: Clinic distances  Assistive device utilized: Single point cane and None Level of assistance: Modified independence and SBA Comments: No overt unsteadiness noted, but some path deviation noted with no AD    FUNCTIONAL TESTS:  5 times sit to stand: 17.3 seconds with  no UE support  30 seconds chair stand test: 9 sit <> stands with no UE support, pt reporting 4-5/10 RPE (<10 below normal for age) Gait speed: 12.4 seconds with no AD = 2.65 ft/sec     M-CTSIB  Condition 1: Firm Surface, EO 30 Sec, Normal Sway  Condition 2: Firm Surface, EC 30 Sec, Normal Sway  Condition 3: Foam Surface, EO 30 Sec, Mild Sway  Condition 4: Foam Surface, EC 30 Sec, Mild Sway                                                                                          TREATMENT DATE: 02/14/24  Therapeutic Activity: 5x sit <> stand: 11.2 seconds with no UE support  10 reps sit <> stands with holding 6# ball and slamming to floor and catching it with no UE support, performed an additional 10 reps on air ex, O2 after 90%, cues for pursed lip breathing, after seated rest break was at 92% (pt reports this normal for her due to emphysema) Alternating forward step ups with contralateral march 2 x 10 reps, trying to perform with no UE support, holding SLS for a couple seconds   NMR:  On air ex: alternating SLS taps to 2nd step at staircase 10 reps each leg, then taps to 6" step and 2nd step 10 reps each side, cues for slowed pace  On rockerboard in A/P direction: 10 reps mini squats  10 reps head nods, 10 reps head turns  Alternating SLS taps to 2 cones, 12 reps each leg, starting with UE support > none On blue foam beam: Alternating forward stepping strategy 5 reps each side, then adding in head turns to R/L an additional 10 reps each side  Side stepping down and back x2 reps with focus on foot clearance and slowed pace, pt intermittently losing balance posteriorly     PATIENT EDUCATION: Education details: Continue HEP, next appt for aquatic therapy  Person educated: Patient Education method: Explanation Education comprehension: verbalized understanding  HOME EXERCISE PROGRAM: Access Code: [email or web address] URL: [email or web address] Date: 02/08/2024 Prepared by:  Maebelle Munroe  Exercises - Seated Hamstring Curl with Anchored Resistance  - 1 x daily - 7 x weekly - 3 sets - 10 reps - Sitting Knee Extension with Resistance  - 1 x daily - 7 x weekly - 3 sets - 10 reps  In supine position; Bridging x 5 reps - attempted to perform with ball between knees but this caused a cramp in Lt hamstring so ball was removed Bridging with marching 5 reps Bridging with LE extension 5 reps Bridging with hip abduction/adduction 5 reps  SLR RLE and LLE 10 reps - no weight  Hip abduction - RLE  and LLE - 10 reps each leg Clam shell exercise 10 reps each leg  HEP: Feet Together (Compliant Surface) Head Motion - Eyes Closed    Stand on compliant surface: __foam pad______ with feet together. Close eyes and move head slowly, up and down. Repeat __1__ times per session. Do __1__ sessions per day.          TherEx:   Performed standing hip flexion, abduction and extension with red theraband 10 reps each leg Performed standing hip flexion with knee flexed at 90 degrees 10 reps each leg with red theraband   issued Medbridge HEP for bil. LE strengthening - pt performed the following exercises 10 reps each  Access Code: [email or web address] URL: [email or web address] Date: 02/01/2024 Prepared by: Maebelle Munroe  Exercises - Bridge  - 1 x daily - 7 x weekly - 1 sets - 10 reps - 5 sec hold hold - Supine Active Straight Leg Raise  - 1 x daily - 7 x weekly - 3 sets - 10 reps - Sidelying Hip Abduction  - 1 x daily - 7 x weekly - 3 sets - 10 reps - Sit to Stand  - 1 x daily - 7 x weekly - 1 sets - 10 reps  GOALS: Goals reviewed with patient? Yes  SHORT TERM GOALS: Target date: 02/15/2024  Pt will be independent with initial HEP for strength/balance in order to build upon functional gains made in therapy. Baseline: Goal status: MET  2.  Further vestibular assessment to be written with LTG updated Baseline: goal not needed Goal status: N/A  3.  FGA to be  assessed with LTG written.  Baseline: score 21/30 on 02-02-24 Goal status: Goal met 02-02-24  4.  Pt will improve 5x sit<>stand to less than or equal to 15 sec to demonstrate improved functional strength and transfer efficiency.  Baseline: 17.3 seconds with no UE support   11.2 seconds with no UE support  Goal status: MET    LONG TERM GOALS: Target date: 03/07/2024  Pt will be independent with final HEP for strength/balance in order to build upon functional gains made in therapy. Baseline:  Goal status: INITIAL  2.  FGA goal to be written as appropriate. Increase FGA score to >/= 26/30 to reduce fall risk and increase safety with ambulation. Baseline: score 21/30 on 02-02-24 Goal status: INITIAL  3.  Vestibular goal to be written as appropriate.  Baseline: goal not needed Goal status: N/A  4.  Pt will improve 30 second chair stand to at least 11 sit <> stands in order to demo above norm for age.   Baseline: 9 sit <> stands with no UE support Goal status: INITIAL  5.  Pt will improve gait speed with no AD vs. LRAD to at least 2.9 ft/sec in order to demo improved community mobility.   Baseline: 12.4 seconds with no AD = 2.65 ft/sec  Goal status: INITIAL    ASSESSMENT:  CLINICAL IMPRESSION: Checked STGs today with pt meeting 3 out of 4 STGs. One goal N/A at this time as vestibular goal not needed. Pt consistently performing HEP at home and pt significant improved 5x sit <> stand with no UE support from 17.3 seconds > 11.2 seconds, indicating decr fall risk and improved strength. Remainder of session focused on balance tasks on compliant surfaces and functional strength. With sit <> stands with 6# medicine ball slams/catches, pt feeling out of breath and O2 did drop to 90%, O2 able to improve to 92% with pursed  lip breathing (pt reports this is her normal due to emphysema). Pt to start aquatic therapy at next session. Will continue per POC.     OBJECTIVE IMPAIRMENTS: Abnormal  gait, cardiopulmonary status limiting activity, decreased activity tolerance, decreased balance, decreased endurance, decreased mobility, difficulty walking, decreased strength, dizziness, postural dysfunction, and pain.   ACTIVITY LIMITATIONS: transfers and locomotion level  PARTICIPATION LIMITATIONS: community activity  PERSONAL FACTORS: Age, Behavior pattern, Past/current experiences, Time since onset of injury/illness/exacerbation, and 3+ comorbidities: AAA, COPD, CAD, fibromyalgia, HLD, HTN, hx of MI, PAD, lumbar spinal surgery 03/24/22, history of severe spinal stenosis at L4-5 status post decompression and fusion from L3-L5 levels. Now with worsening spinal stenosis at L2-3 level.   are also affecting patient's functional outcome.   REHAB POTENTIAL: Good  CLINICAL DECISION MAKING: Evolving/moderate complexity  EVALUATION COMPLEXITY: Moderate  PLAN:  PT FREQUENCY: 2x/week  PT DURATION: 8 weeks - anticipate 6 weeks   PLANNED INTERVENTIONS: 97164- PT Re-evaluation, 97110-Therapeutic exercises, 97530- Therapeutic activity, 97112- Neuromuscular re-education, 97535- Self Care, 16109- Manual therapy, [phone]- Gait training, [phone]- Canalith repositioning, [phone]- Aquatic Therapy, Patient/Family education, Balance training, Stair training, Vestibular training, and DME instructions  PLAN FOR NEXT SESSION:   continue strengthening and balance exercises - pt is scheduled for aquatic PT on 02-15-24     Drake Leach, PT, DPT 02/14/2024, 9:31 AM

## 2024-02-15 ENCOUNTER — Ambulatory Visit: Admitting: Physical Therapy

## 2024-02-15 DIAGNOSIS — R2689 Other abnormalities of gait and mobility: Secondary | ICD-10-CM | POA: Diagnosis not present

## 2024-02-15 DIAGNOSIS — R2681 Unsteadiness on feet: Secondary | ICD-10-CM

## 2024-02-15 DIAGNOSIS — M6281 Muscle weakness (generalized): Secondary | ICD-10-CM

## 2024-02-16 ENCOUNTER — Encounter: Payer: Medicare Other | Admitting: Physical Therapy

## 2024-02-16 ENCOUNTER — Encounter: Payer: Self-pay | Admitting: Physical Therapy

## 2024-02-16 NOTE — Therapy (Signed)
 OUTPATIENT PHYSICAL THERAPY NEURO/AQUATIC TREATMENT NOTE   Patient Name: AMRITA RADU MRN: [id number] DOB:1946/01/17, 78 y.o., female Today's Date: 02/16/2024   PCP: Willow Ora, MD  REFERRING PROVIDER: Suanne Marker, MD  END OF SESSION:  PT End of Session - 02/16/24 0753     Visit Number 7    Number of Visits 13    Date for PT Re-Evaluation 03/24/24    Authorization Type [employer]    Authorization Time Period 01-24-24 - 03-20-24    Authorization - Visit Number 7    Authorization - Number of Visits 17    PT Start Time 1310    PT Stop Time 1400    PT Time Calculation (min) 50 min    Equipment Utilized During Treatment Other (comment)   small bar bells, aquatic step   Activity Tolerance Patient tolerated treatment well    Behavior During Therapy WFL for tasks assessed/performed                 Past Medical History:  Diagnosis Date   AAA (abdominal aortic aneurysm) (HCC)    a. s/p stent grafting in 2009.   Adenomatous colon polyp 05/2001   Adenomatous duodenal polyp    Allergy    Anginal pain (HCC)    COPD (chronic obstructive pulmonary disease) (HCC)    pt denies COPD   Coronary artery disease    a. s/p CABG in 1995;  b. 05/2013 Neg MV, EF 82%;  c. 06/2013 Cath: LM nl, LAD 40-50p, LCX nl, RCA 80p/153m, VG->RCA->PDA->PLSA min irregs, LIMA->LAD atretic, vigorous LV fxn.   Diabetes mellitus without complication (HCC)    history of, resolved. 2017   Diverticulosis    Eczema, dyshidrotic    Eczema, dyshidrotic 1986   Dr. Gerilyn Nestle   Emphysema of lung [facility])    Fatty liver    Fibromyalgia    GERD (gastroesophageal reflux disease)    Hyperlipidemia    Hypertension    Migraine    Myocardial infarction [facility])    Osteoarthritis    Osteoporosis    unsure, having a bone scan soon   Peripheral arterial disease (HCC)    left leg diagnosed by Dr Patty Sermons   Renal artery stenosis [facility])    a. 2008 s/p PTA   Past Surgical History:   Procedure Laterality Date   ABDOMINAL AORTAGRAM N/A 03/06/2014   Procedure: ABDOMINAL AORTAGRAM;  Surgeon: Larina Earthly, MD;  Location: [facility] CATH LAB;  Service: Cardiovascular;  Laterality: N/A;   ABDOMINAL AORTIC ANEURYSM REPAIR  2009   stenting   ABDOMINAL HYSTERECTOMY     Total, 1992   BREAST BIOPSY     CARDIAC CATHETERIZATION     CARDIOVASCULAR STRESS TEST  11/11/2009   normal study   CARPAL TUNNEL RELEASE  08/1993   left wrist   CARPAL TUNNEL RELEASE  01/1997   right   CATARACT EXTRACTION, BILATERAL  2021   CORONARY ARTERY BYPASS GRAFT  07/1994   Triple by Dr Ofilia Neas   CORONARY STENT INTERVENTION N/A 02/02/2021   Procedure: CORONARY STENT INTERVENTION;  Surgeon: Yvonne Kendall, MD;  Location: [facility] INVASIVE CV LAB;  Service: Cardiovascular;  Laterality: N/A;   CORONARY STENT INTERVENTION N/A 06/26/2021   Procedure: CORONARY STENT INTERVENTION;  Surgeon: Tonny Bollman, MD;  Location: [facility] INVASIVE CV LAB;  Service: Cardiovascular;  Laterality: N/A;   CORONARY ULTRASOUND/IVUS N/A 06/26/2021   Procedure: Intravascular Ultrasound/IVUS;  Surgeon: Tonny Bollman, MD;  Location: [facility] INVASIVE CV LAB;  Service: Cardiovascular;  Laterality: N/A;   CORONARY ULTRASOUND/IVUS N/A 09/03/2021   Procedure: Intravascular Ultrasound/IVUS;  Surgeon: Orbie Pyo, MD;  Location: [facility] INVASIVE CV LAB;  Service: Cardiovascular;  Laterality: N/A;   dental implants     EYE SURGERY  03/2020   cataract   HAND SURGERY Right 09/2002   Right hand pulley release   LAMINECTOMY WITH POSTERIOR LATERAL ARTHRODESIS LEVEL 1 N/A 03/24/2022   Procedure: LAMINECTOMY, FORAMINOTOMY, LUMBAR TWO- THREE ; INTERTRANSVERSE FUSION LUMBAR TWO - LUMBAR THREE RIGHT;  Surgeon: Tia Alert, MD;  Location: [facility] OR;  Service: Neurosurgery;  Laterality: N/A;   LEFT HEART CATH AND CORONARY ANGIOGRAPHY N/A 06/26/2021   Procedure: LEFT HEART CATH AND CORONARY ANGIOGRAPHY;  Surgeon: Tonny Bollman, MD;  Location: [facility] INVASIVE CV LAB;   Service: Cardiovascular;  Laterality: N/A;   LEFT HEART CATH AND CORS/GRAFTS ANGIOGRAPHY N/A 02/02/2021   Procedure: LEFT HEART CATH AND CORS/GRAFTS ANGIOGRAPHY;  Surgeon: Yvonne Kendall, MD;  Location: [facility] INVASIVE CV LAB;  Service: Cardiovascular;  Laterality: N/A;   LEFT HEART CATH AND CORS/GRAFTS ANGIOGRAPHY N/A 09/03/2021   Procedure: LEFT HEART CATH AND CORS/GRAFTS ANGIOGRAPHY;  Surgeon: Orbie Pyo, MD;  Location: [facility] INVASIVE CV LAB;  Service: Cardiovascular;  Laterality: N/A;   LEFT HEART CATH AND CORS/GRAFTS ANGIOGRAPHY N/A 07/15/2022   Procedure: LEFT HEART CATH AND CORS/GRAFTS ANGIOGRAPHY;  Surgeon: Swazil[place], Peter M, MD;  Location: [facility] INVASIVE CV LAB;  Service: Cardiovascular;  Laterality: N/A;   LEFT HEART CATHETERIZATION WITH CORONARY/GRAFT ANGIOGRAM N/A 06/11/2013   Procedure: LEFT HEART CATHETERIZATION WITH Isabel Caprice;  Surgeon: Vesta Mixer, MD;  Location: [facility] CATH LAB;  Service: Cardiovascular;  Laterality: N/A;   MULTIPLE TOOTH EXTRACTIONS  2000   All upper teeth.  Has full dneture.    RENAL ARTERY STENT  2008   SHOULDER ARTHROSCOPY WITH ROTATOR CUFF REPAIR Right 09/27/2018   Procedure: Right shoulder mini open rotator cuff repair;  Surgeon: Jene Every, MD;  Location: [facility] ORS;  Service: Orthopedics;  Laterality: Right;  90 mins   thyroid cyst apiration  05/1997 and 07/1998   TONSILECTOMY, ADENOIDECTOMY, BILATERAL MYRINGOTOMY AND TUBES  1989   TONSILLECTOMY     TOTAL ABDOMINAL HYSTERECTOMY  1992   Patient Active Problem List   Diagnosis Date Noted   Oxygen dependent 04/29/2023   Centrilobular emphysema (HCC) 02/17/2023   Chronic respiratory failure (HCC) 02/17/2023   Pneumonia 02/17/2023   Senile purpura (HCC) 12/22/2022   Daily consumption of alcohol 12/22/2022   S/P lumbar laminectomy 03/24/2022   Thyroid nodule greater than or equal to 1.5 cm in diameter incidentally noted on imaging study 08/27/2021   Angina pectoris (HCC) 06/25/2021   History of  compression fracture of spine - lumbar 06/19/2021   History of lumbosacral spine surgery 03/03/2021   NSTEMI (non-ST elevated myocardial infarction) (HCC) 2022 01/31/2021   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/26/2021   Neuropathic pain 12/05/2020   Gait instability 09/09/2020   Lumbar stenosis without neurogenic claudication 02/26/2020   COPD with chronic bronchitis (HCC) 09/28/2018   Essential (primary) hypertension 07/10/2018   IFG (impaired fasting glucose) 11/10/2017   DJD (degenerative joint disease), lumbosacral 11/10/2017   Fibromyalgia 11/10/2017   Dyshidrotic eczema 11/10/2017   GERD (gastroesophageal reflux disease) 11/10/2017   Osteopenia 11/10/2017   Does use hearing aid 11/10/2017   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   Fatty liver 11/10/2017   Coronary artery disease    Peripheral vascular disease with claudication (HCC)    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  History of abdominal aortic aneurysm repair 05/18/2011   History of renal artery stenosis 05/18/2011   Hyperlipidemia LDL goal <70     ONSET DATE: 12/14/2023  REFERRING DIAG: R53.1 (ICD-10-CM) - Weakness H55.00 (ICD-10-CM) - Nystagmus H81.4 (ICD-10-CM) - Central positional vertigo  THERAPY DIAG:  Muscle weakness (generalized)  Unsteadiness on feet  Other abnormalities of gait and mobility  Rationale for Evaluation and Treatment: Rehabilitation  SUBJECTIVE:                                                                                                                                                                                             SUBJECTIVE STATEMENT:  Will be going to aquatic therapy tomorrow. Been working on the exercises, has been feeling a little sore. No falls.  Pt accompanied by: self  PERTINENT HISTORY: PMH: AAA, COPD, CAD, fibromyalgia, HLD, HTN, hx of MI, PAD, lumbar spinal surgery 03/24/22, history of severe spinal stenosis at L4-5 status post decompression and fusion from L3-L5  levels. Now with worsening spinal stenosis at L2-3 level.   Per Dr. Marjory Lies: Gait difficulty likely related to hip flexor weakness associated with L2-3 spinal stenosis.   PAIN:  Are you having pain? Yes, in pain most of the time, has fibromyalgia, on oxycodone - 6/10 generalized pain; on oxycodone and aspirin  PRECAUTIONS: Fall   FALLS: Has patient fallen in last 6 months? No  LIVING ENVIRONMENT: Lives with: lives alone, husband in a nursing house  Lives in: House/apartment Stairs: 3rd floor apartment, can use an elevator  Has following equipment at home: Single point cane, Environmental consultant - 4 wheeled, and Grab bars  PLOF: Independent and Independent with community mobility with device  PATIENT GOALS: Wants to work on her strength, re-assess for BPPV, working on some balance    OBJECTIVE:  Note: Objective measures were completed at Evaluation unless otherwise noted.  DIAGNOSTIC FINDINGS: 02/20/23: IMPRESSION:    Unremarkable MRI brain (with and without).  No acute findings.    COGNITION: Overall cognitive status: Within functional limits for tasks assessed   SENSATION: Light touch: WFL  POSTURE: forward head   LOWER EXTREMITY MMT:    MMT Right Eval Left Eval  Hip flexion 3 3  Hip extension    Hip abduction 4+ 4+  Hip adduction 4+ 4+  Hip internal rotation    Hip external rotation    Knee flexion 4- 4  Knee extension 5 5  Ankle dorsiflexion    Ankle plantarflexion    Ankle inversion    Ankle eversion    (Blank rows = not tested)  All tested in sitting  TRANSFERS: Assistive device utilized: None  Sit to stand: Modified independence Stand to sit: Modified independence    Performs with wide BOS   GAIT: Gait pattern: step through pattern Distance walked: Clinic distances  Assistive device utilized: Single point cane and None Level of assistance: Modified independence and SBA Comments: No overt unsteadiness noted, but some path deviation noted with no AD     FUNCTIONAL TESTS:  5 times sit to stand: 17.3 seconds with no UE support  30 seconds chair stand test: 9 sit <> stands with no UE support, pt reporting 4-5/10 RPE (<10 below normal for age) Gait speed: 12.4 seconds with no AD = 2.65 ft/sec     M-CTSIB  Condition 1: Firm Surface, EO 30 Sec, Normal Sway  Condition 2: Firm Surface, EC 30 Sec, Normal Sway  Condition 3: Foam Surface, EO 30 Sec, Mild Sway  Condition 4: Foam Surface, EC 30 Sec, Mild Sway                                                                                          TREATMENT DATE: 02/15/24  Patient seen for aquatic therapy today.  Treatment took place in water 3.6-4.5 feet deep depending upon activity.  Pt entered and exited the pool via step negotiation with use of hand rails modified independently.    Aquatic therapy at [facility] - pool temp. 90 degrees   Patient seen for aquatic therapy today.  Treatment took place in water 3.6-4.5 feet deep depending upon activity.  Pt entered and exited the pool via step negotiation with use   of bil. Hand rails using step by step sequence modified independently.    Pt performed water walking forwards, backwards and sideways - 18' x 4 reps each direction- no floatation device used    Marching in place 10 reps each leg - with contralateral UE flexion with 2 sec hold for balance; marching forwards across width of pool slowly with 1-2 sec hold on stance leg to facilitate improved SLS   Core stabilization exercise - pt performed stepping forward with barbells held in Ue's - pushing forward, then stepping back with pulling barbells back (scapular retraction), stepping laterally with horizontal abdct./adduction with stepping back together 5 reps each direction; then performed stepping backward with pushing barbells forward (scapular protraction) 5 reps for core and LE strengthening    Squats bil. LE's 10 reps holding onto bar bells; RLE unilateral squats 10 reps:  LLE unilateral  squats 10 reps - with increased bilateral UE support with unilateral squats   Pt performed standing hip flexion, abduction and extension 10 reps each leg - pt held side of pool for increased challenge with static standing balance   Pt stood on RLE and then on LLE (no increased resistance) - made circles CW and then CCW 10 reps each direction with each leg for improved SLS on each leg - without UE support               Pt performed crossovers front 18' x 2 reps for improved balance with narrow BOS; stepping behind 18' x 2 reps; braiding 18' x 2 reps  Pt performed tap ups to aquatic step 10 reps each LE: progressed to diagonal tap ups 10 reps each LE for increased challenge with SLS balance              Pt performed Ai Chi posture "Balancing" 10 reps each side - with UE support on pool edge and with tactile cues for correct sequence of UE/LE        movement  Pt requires buoyancy of water for support for joint unloading and for safety with balance exercises with reduced risk of fall compared to that on land;  current of water provides perturbations for challenges with standing balance Viscosity of water is needed for resistance for strengthening exercises        PATIENT EDUCATION: Education details: Continue HEP, next appt for aquatic therapy  Person educated: Patient Education method: Explanation Education comprehension: verbalized understanding  HOME EXERCISE PROGRAM: Access Code: [email or web address] URL: [email or web address] Date: 02/08/2024 Prepared by: Maebelle Munroe  Exercises - Seated Hamstring Curl with Anchored Resistance  - 1 x daily - 7 x weekly - 3 sets - 10 reps - Sitting Knee Extension with Resistance  - 1 x daily - 7 x weekly - 3 sets - 10 reps  In supine position; Bridging x 5 reps - attempted to perform with ball between knees but this caused a cramp in Lt hamstring so ball was removed Bridging with marching 5 reps Bridging with LE extension 5 reps Bridging  with hip abduction/adduction 5 reps  SLR RLE and LLE 10 reps - no weight  Hip abduction - RLE and LLE - 10 reps each leg Clam shell exercise 10 reps each leg  HEP: Feet Together (Compliant Surface) Head Motion - Eyes Closed    Stand on compliant surface: __foam pad______ with feet together. Close eyes and move head slowly, up and down. Repeat __1__ times per session. Do __1__ sessions per day.          TherEx:   Performed standing hip flexion, abduction and extension with red theraband 10 reps each leg Performed standing hip flexion with knee flexed at 90 degrees 10 reps each leg with red theraband   issued [facility] HEP for bil. LE strengthening - pt performed the following exercises 10 reps each  Access Code: [email or web address] URL: [email or web address] Date: 02/01/2024 Prepared by: Maebelle Munroe  Exercises - Bridge  - 1 x daily - 7 x weekly - 1 sets - 10 reps - 5 sec hold hold - Supine Active Straight Leg Raise  - 1 x daily - 7 x weekly - 3 sets - 10 reps - Sidelying Hip Abduction  - 1 x daily - 7 x weekly - 3 sets - 10 reps - Sit to Stand  - 1 x daily - 7 x weekly - 1 sets - 10 reps  GOALS: Goals reviewed with patient? Yes  SHORT TERM GOALS: Target date: 02/15/2024  Pt will be independent with initial HEP for strength/balance in order to build upon functional gains made in therapy. Baseline: Goal status: MET  2.  Further vestibular assessment to be written with LTG updated Baseline: goal not needed Goal status: N/A  3.  FGA to be assessed with LTG written.  Baseline: score 21/30 on 02-02-24 Goal status: Goal met 02-02-24  4.  Pt will improve 5x sit<>stand to less than or equal to 15 sec to demonstrate improved functional strength and transfer efficiency.  Baseline: 17.3 seconds with no UE support   11.2 seconds with no UE  support  Goal status: MET    LONG TERM GOALS: Target date: 03/07/2024  Pt will be independent with final HEP for strength/balance  in order to build upon functional gains made in therapy. Baseline:  Goal status: INITIAL  2.  FGA goal to be written as appropriate. Increase FGA score to >/= 26/30 to reduce fall risk and increase safety with ambulation. Baseline: score 21/30 on 02-02-24 Goal status: INITIAL  3.  Vestibular goal to be written as appropriate.  Baseline: goal not needed Goal status: N/A  4.  Pt will improve 30 second chair stand to at least 11 sit <> stands in order to demo above norm for age.   Baseline: 9 sit <> stands with no UE support Goal status: INITIAL  5.  Pt will improve gait speed with no AD vs. LRAD to at least 2.9 ft/sec in order to demo improved community mobility.   Baseline: 12.4 seconds with no AD = 2.65 ft/sec  Goal status: INITIAL    ASSESSMENT:  CLINICAL IMPRESSION: Aquatic PT session focused on bil. LE strengthening, balance training and water walking in various directions without use of floatation device.  Pt demonstrated mild decreased core stability as evidenced by postural sway with standing with narrow BOS with UE movement, especially with occurrence of increased water current producing perturbations.  Pt had no LOB with water walking in various directions.  Pt tolerated exercises well for initial participation in aquatic PT.  Will continue per POC.     OBJECTIVE IMPAIRMENTS: Abnormal gait, cardiopulmonary status limiting activity, decreased activity tolerance, decreased balance, decreased endurance, decreased mobility, difficulty walking, decreased strength, dizziness, postural dysfunction, and pain.   ACTIVITY LIMITATIONS: transfers and locomotion level  PARTICIPATION LIMITATIONS: community activity  PERSONAL FACTORS: Age, Behavior pattern, Past/current experiences, Time since onset of injury/illness/exacerbation, and 3+ comorbidities: AAA, COPD, CAD, fibromyalgia, HLD, HTN, hx of MI, PAD, lumbar spinal surgery 03/24/22, history of severe spinal stenosis at L4-5 status  post decompression and fusion from L3-L5 levels. Now with worsening spinal stenosis at L2-3 level.   are also affecting patient's functional outcome.   REHAB POTENTIAL: Good  CLINICAL DECISION MAKING: Evolving/moderate complexity  EVALUATION COMPLEXITY: Moderate  PLAN:  PT FREQUENCY: 2x/week  PT DURATION: 8 weeks - anticipate 6 weeks   PLANNED INTERVENTIONS: 97164- PT Re-evaluation, 97110-Therapeutic exercises, 97530- Therapeutic activity, 97112- Neuromuscular re-education, 97535- Self Care, 14782- Manual therapy, [phone]- Gait training, [phone]- Canalith repositioning, [phone]- Aquatic Therapy, Patient/Family education, Balance training, Stair training, Vestibular training, and DME instructions  PLAN FOR NEXT SESSION:   continue strengthening and balance exercises   Aquatic -- add buoyant cuffs to LE ROM exercises for strengthening   Desmond Szabo, Donavan Burnet, PT 02/16/2024, 8:16 AM

## 2024-02-21 ENCOUNTER — Ambulatory Visit: Payer: Medicare Other | Admitting: Physical Therapy

## 2024-02-21 ENCOUNTER — Ambulatory Visit: Admitting: Physical Therapy

## 2024-02-21 DIAGNOSIS — R2689 Other abnormalities of gait and mobility: Secondary | ICD-10-CM | POA: Diagnosis not present

## 2024-02-21 DIAGNOSIS — M6281 Muscle weakness (generalized): Secondary | ICD-10-CM

## 2024-02-21 DIAGNOSIS — R2681 Unsteadiness on feet: Secondary | ICD-10-CM

## 2024-02-21 NOTE — Therapy (Signed)
 OUTPATIENT PHYSICAL THERAPY NEURO TREATMENT NOTE   Patient Name: Megan [name] MRN: [id number] DOB:February 18, 1946, 78 y.o., female Today's Date: 02/21/2024   PCP: Willow Ora, MD  REFERRING PROVIDER: Suanne Marker, MD  END OF SESSION:  PT End of Session - 02/21/24 0933     Visit Number 8    Number of Visits 13    Date for PT Re-Evaluation 03/24/24    Authorization Type [employer]IC[employer]    Authorization Time Period 01-24-24 - 03-20-24    Authorization - Number of Visits 17    PT Start Time 0932    PT Stop Time 1012    PT Time Calculation (min) 40 min    Equipment Utilized During Treatment Gait belt    Activity Tolerance Patient tolerated treatment well    Behavior During Therapy WFL for tasks assessed/performed                 Past Medical History:  Diagnosis Date   AAA (abdominal aortic aneurysm) (HCC)    a. s/p stent grafting in 2009.   Adenomatous colon polyp 05/2001   Adenomatous duodenal polyp    Allergy    Anginal pain (HCC)    COPD (chronic obstructive pulmonary disease) (HCC)    pt denies COPD   Coronary artery disease    a. s/p CABG in 1995;  b. 05/2013 Neg MV, EF 82%;  c. 06/2013 Cath: LM nl, LAD 40-50p, LCX nl, RCA 80p/15m, VG->RCA->PDA->PLSA min irregs, LIMA->LAD atretic, vigorous LV fxn.   Diabetes mellitus without complication (HCC)    history of, resolved. 2017   Diverticulosis    Eczema, dyshidrotic    Eczema, dyshidrotic 1986   Dr. Gerilyn Nestle   Emphysema of lung [facility])    Fatty liver    Fibromyalgia    GERD (gastroesophageal reflux disease)    Hyperlipidemia    Hypertension    Migraine    Myocardial infarction [facility])    Osteoarthritis    Osteoporosis    unsure, having a bone scan soon   Peripheral arterial disease (HCC)    left leg diagnosed by Dr Patty Sermons   Renal artery stenosis [facility])    a. 2008 s/p PTA   Past Surgical History:  Procedure Laterality Date   ABDOMINAL AORTAGRAM N/A 03/06/2014   Procedure: ABDOMINAL  AORTAGRAM;  Surgeon: Larina Earthly, MD;  Location: [facility] CATH LAB;  Service: Cardiovascular;  Laterality: N/A;   ABDOMINAL AORTIC ANEURYSM REPAIR  2009   stenting   ABDOMINAL HYSTERECTOMY     Total, 1992   BREAST BIOPSY     CARDIAC CATHETERIZATION     CARDIOVASCULAR STRESS TEST  11/11/2009   normal study   CARPAL TUNNEL RELEASE  08/1993   left wrist   CARPAL TUNNEL RELEASE  01/1997   right   CATARACT EXTRACTION, BILATERAL  2021   CORONARY ARTERY BYPASS GRAFT  07/1994   Triple by Dr Ofilia Neas   CORONARY STENT INTERVENTION N/A 02/02/2021   Procedure: CORONARY STENT INTERVENTION;  Surgeon: Yvonne Kendall, MD;  Location: [facility] INVASIVE CV LAB;  Service: Cardiovascular;  Laterality: N/A;   CORONARY STENT INTERVENTION N/A 06/26/2021   Procedure: CORONARY STENT INTERVENTION;  Surgeon: Tonny Bollman, MD;  Location: [facility] INVASIVE CV LAB;  Service: Cardiovascular;  Laterality: N/A;   CORONARY ULTRASOUND/IVUS N/A 06/26/2021   Procedure: Intravascular Ultrasound/IVUS;  Surgeon: Tonny Bollman, MD;  Location: [facility] INVASIVE CV LAB;  Service: Cardiovascular;  Laterality: N/A;   CORONARY ULTRASOUND/IVUS N/A 09/03/2021  Procedure: Intravascular Ultrasound/IVUS;  Surgeon: Orbie Pyo, MD;  Location: [facility] INVASIVE CV LAB;  Service: Cardiovascular;  Laterality: N/A;   dental implants     EYE SURGERY  03/2020   cataract   HAND SURGERY Right 09/2002   Right hand pulley release   LAMINECTOMY WITH POSTERIOR LATERAL ARTHRODESIS LEVEL 1 N/A 03/24/2022   Procedure: LAMINECTOMY, FORAMINOTOMY, LUMBAR TWO- THREE ; INTERTRANSVERSE FUSION LUMBAR TWO - LUMBAR THREE RIGHT;  Surgeon: Tia Alert, MD;  Location: [facility] OR;  Service: Neurosurgery;  Laterality: N/A;   LEFT HEART CATH AND CORONARY ANGIOGRAPHY N/A 06/26/2021   Procedure: LEFT HEART CATH AND CORONARY ANGIOGRAPHY;  Surgeon: Tonny Bollman, MD;  Location: [facility] INVASIVE CV LAB;  Service: Cardiovascular;  Laterality: N/A;   LEFT HEART CATH AND CORS/GRAFTS ANGIOGRAPHY N/A  02/02/2021   Procedure: LEFT HEART CATH AND CORS/GRAFTS ANGIOGRAPHY;  Surgeon: Yvonne Kendall, MD;  Location: [facility] INVASIVE CV LAB;  Service: Cardiovascular;  Laterality: N/A;   LEFT HEART CATH AND CORS/GRAFTS ANGIOGRAPHY N/A 09/03/2021   Procedure: LEFT HEART CATH AND CORS/GRAFTS ANGIOGRAPHY;  Surgeon: Orbie Pyo, MD;  Location: [facility] INVASIVE CV LAB;  Service: Cardiovascular;  Laterality: N/A;   LEFT HEART CATH AND CORS/GRAFTS ANGIOGRAPHY N/A 07/15/2022   Procedure: LEFT HEART CATH AND CORS/GRAFTS ANGIOGRAPHY;  Surgeon: Swazil[place], Peter M, MD;  Location: [facility] INVASIVE CV LAB;  Service: Cardiovascular;  Laterality: N/A;   LEFT HEART CATHETERIZATION WITH CORONARY/GRAFT ANGIOGRAM N/A 06/11/2013   Procedure: LEFT HEART CATHETERIZATION WITH Isabel Caprice;  Surgeon: Vesta Mixer, MD;  Location: [facility] CATH LAB;  Service: Cardiovascular;  Laterality: N/A;   MULTIPLE TOOTH EXTRACTIONS  2000   All upper teeth.  Has full dneture.    RENAL ARTERY STENT  2008   SHOULDER ARTHROSCOPY WITH ROTATOR CUFF REPAIR Right 09/27/2018   Procedure: Right shoulder mini open rotator cuff repair;  Surgeon: Jene Every, MD;  Location: [facility] ORS;  Service: Orthopedics;  Laterality: Right;  90 mins   thyroid cyst apiration  05/1997 and 07/1998   TONSILECTOMY, ADENOIDECTOMY, BILATERAL MYRINGOTOMY AND TUBES  1989   TONSILLECTOMY     TOTAL ABDOMINAL HYSTERECTOMY  1992   Patient Active Problem List   Diagnosis Date Noted   Oxygen dependent 04/29/2023   Centrilobular emphysema (HCC) 02/17/2023   Chronic respiratory failure (HCC) 02/17/2023   Pneumonia 02/17/2023   Senile purpura (HCC) 12/22/2022   Daily consumption of alcohol 12/22/2022   S/P lumbar laminectomy 03/24/2022   Thyroid nodule greater than or equal to 1.5 cm in diameter incidentally noted on imaging study 08/27/2021   Angina pectoris (HCC) 06/25/2021   History of compression fracture of spine - lumbar 06/19/2021   History of lumbosacral spine surgery  03/03/2021   NSTEMI (non-ST elevated myocardial infarction) (HCC) 2022 01/31/2021   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/26/2021   Neuropathic pain 12/05/2020   Gait instability 09/09/2020   Lumbar stenosis without neurogenic claudication 02/26/2020   COPD with chronic bronchitis (HCC) 09/28/2018   Essential (primary) hypertension 07/10/2018   IFG (impaired fasting glucose) 11/10/2017   DJD (degenerative joint disease), lumbosacral 11/10/2017   Fibromyalgia 11/10/2017   Dyshidrotic eczema 11/10/2017   GERD (gastroesophageal reflux disease) 11/10/2017   Osteopenia 11/10/2017   Does use hearing aid 11/10/2017   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   Fatty liver 11/10/2017   Coronary artery disease    Peripheral vascular disease with claudication (HCC)    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   History of abdominal aortic aneurysm repair 05/18/2011   History of  renal artery stenosis 05/18/2011   Hyperlipidemia LDL goal <70     ONSET DATE: 12/14/2023  REFERRING DIAG: R53.1 (ICD-10-CM) - Weakness H55.00 (ICD-10-CM) - Nystagmus H81.4 (ICD-10-CM) - Central positional vertigo  THERAPY DIAG:  Muscle weakness (generalized)  Unsteadiness on feet  Other abnormalities of gait and mobility  Rationale for Evaluation and Treatment: Rehabilitation  SUBJECTIVE:                                                                                                                                                                                             SUBJECTIVE STATEMENT: Had a good time at the pool. Notes the water really challenges her balance. No falls. Notes the therabands were bothering her hips, stopped using them. Wants to practice getting on and off the floor.   Pt accompanied by: self  PERTINENT HISTORY: PMH: AAA, COPD, CAD, fibromyalgia, HLD, HTN, hx of MI, PAD, lumbar spinal surgery 03/24/22, history of severe spinal stenosis at L4-5 status post decompression and fusion from L3-L5 levels.  Now with worsening spinal stenosis at L2-3 level.   Per Dr. Marjory Lies: Gait difficulty likely related to hip flexor weakness associated with L2-3 spinal stenosis.   PAIN:  Are you having pain? Yes, in pain most of the time, has fibromyalgia, on oxycodone - 6/10 generalized pain; on oxycodone and aspirin  PRECAUTIONS: Fall   FALLS: Has patient fallen in last 6 months? No  LIVING ENVIRONMENT: Lives with: lives alone, husband in a nursing house  Lives in: House/apartment Stairs: 3rd floor apartment, can use an elevator  Has following equipment at home: Single point cane, Environmental consultant - 4 wheeled, and Grab bars  PLOF: Independent and Independent with community mobility with device  PATIENT GOALS: Wants to work on her strength, re-assess for BPPV, working on some balance    OBJECTIVE:  Note: Objective measures were completed at Evaluation unless otherwise noted.  DIAGNOSTIC FINDINGS: 02/20/23: IMPRESSION:    Unremarkable MRI brain (with and without).  No acute findings.    COGNITION: Overall cognitive status: Within functional limits for tasks assessed   SENSATION: Light touch: WFL  POSTURE: forward head   LOWER EXTREMITY MMT:    MMT Right Eval Left Eval  Hip flexion 3 3  Hip extension    Hip abduction 4+ 4+  Hip adduction 4+ 4+  Hip internal rotation    Hip external rotation    Knee flexion 4- 4  Knee extension 5 5  Ankle dorsiflexion    Ankle plantarflexion    Ankle inversion    Ankle eversion    (Blank rows = not tested)  All tested in  sitting    TRANSFERS: Assistive device utilized: None  Sit to stand: Modified independence Stand to sit: Modified independence    Performs with wide BOS   GAIT: Gait pattern: step through pattern Distance walked: Clinic distances  Assistive device utilized: Single point cane and None Level of assistance: Modified independence and SBA Comments: No overt unsteadiness noted, but some path deviation noted with no AD     FUNCTIONAL TESTS:  5 times sit to stand: 17.3 seconds with no UE support  30 seconds chair stand test: 9 sit <> stands with no UE support, pt reporting 4-5/10 RPE (<10 below normal for age) Gait speed: 12.4 seconds with no AD = 2.65 ft/sec     M-CTSIB  Condition 1: Firm Surface, EO 30 Sec, Normal Sway  Condition 2: Firm Surface, EC 30 Sec, Normal Sway  Condition 3: Foam Surface, EO 30 Sec, Mild Sway  Condition 4: Foam Surface, EC 30 Sec, Mild Sway                                                                                          TREATMENT DATE: 02/21/24  Therapeutic Activity: Floor transfers Pt gets down to floor with mod I using BUE support on mat table  Worked on getting up from the floor in a half kneeling position, pt stands up with LLE forwards and pressing up with BUE support (on chair and mat table), practiced 3 reps of this with pt performing with supervision. Attempted with RLE forwards, but pt unable to come to stand with this leg forwards   Therapeutic Exercise: Quadruped: Alternating hip extensions with leg straight 10 reps each leg, cues for slowed pace and hip extension  Bird dog for core activation/balance 10 reps, cues for slowed pace  10 reps clamshells/fire hydrants each leg  for hip ABD strengthening  Half kneeling: Tall posture 10 reps chest press with 3.3# ball, more unsteadiness noted with RLE posteriorly, but still able to maintain balance  Split stance mini squats 5# 10 reps each leg, 10# 10 reps each leg posteriorly, Cues for technique, pt more challenged with RLE posteriorly   Pt requesting to have quadruped exercises added to HEP for variety, discussed pt does not have to perform them all each time     PATIENT EDUCATION: Education details: Floor transfers, added in quadruped exercises (per pt request) that she can also safely work on at home  Person educated: Patient Education method: Programmer, multimedia, Facilities manager, Verbal cues, and  Handouts Education comprehension: verbalized understanding and returned demonstration  HOME EXERCISE PROGRAM: Access Code: [email or web address] URL: [email or web address] Date: 02/21/2024 Prepared by: Sherlie Ban  Exercises - Seated Hamstring Curl with Anchored Resistance  - 1 x daily - 7 x weekly - 3 sets - 10 reps - Sitting Knee Extension with Resistance  - 1 x daily - 7 x weekly - 3 sets - 10 reps  New additions on 02/21/24  - Quadruped Alternating Leg Extensions  - 1 x daily - 5 x weekly - 2 sets - 10 reps - Quadruped Hip Abduction and External Rotation  - 1 x daily - 5 x weekly - 2 sets -  10 reps - Half Kneeling Hip Flexor Stretch  - 1 x daily - 7 x weekly  In supine position; Bridging x 5 reps - attempted to perform with ball between knees but this caused a cramp in Lt hamstring so ball was removed Bridging with marching 5 reps Bridging with LE extension 5 reps Bridging with hip abduction/adduction 5 reps  SLR RLE and LLE 10 reps - no weight  Hip abduction - RLE and LLE - 10 reps each leg Clam shell exercise 10 reps each leg  HEP: Feet Together (Compliant Surface) Head Motion - Eyes Closed    Stand on compliant surface: __foam pad______ with feet together. Close eyes and move head slowly, up and down. Repeat __1__ times per session. Do __1__ sessions per day.          TherEx:   Performed standing hip flexion, abduction and extension with red theraband 10 reps each leg Performed standing hip flexion with knee flexed at 90 degrees 10 reps each leg with red theraband   issued Medbridge HEP for bil. LE strengthening - pt performed the following exercises 10 reps each  Access Code: [email or web address] URL: [email or web address] Date: 02/01/2024 Prepared by: Maebelle Munroe  Exercises - Bridge  - 1 x daily - 7 x weekly - 1 sets - 10 reps - 5 sec hold hold - Supine Active Straight Leg Raise  - 1 x daily - 7 x weekly - 3 sets - 10 reps - Sidelying Hip  Abduction  - 1 x daily - 7 x weekly - 3 sets - 10 reps - Sit to Stand  - 1 x daily - 7 x weekly - 1 sets - 10 reps  GOALS: Goals reviewed with patient? Yes  SHORT TERM GOALS: Target date: 02/15/2024  Pt will be independent with initial HEP for strength/balance in order to build upon functional gains made in therapy. Baseline: Goal status: MET  2.  Further vestibular assessment to be written with LTG updated Baseline: goal not needed Goal status: N/A  3.  FGA to be assessed with LTG written.  Baseline: score 21/30 on 02-02-24 Goal status: Goal met 02-02-24  4.  Pt will improve 5x sit<>stand to less than or equal to 15 sec to demonstrate improved functional strength and transfer efficiency.  Baseline: 17.3 seconds with no UE support   11.2 seconds with no UE support  Goal status: MET    LONG TERM GOALS: Target date: 03/07/2024  Pt will be independent with final HEP for strength/balance in order to build upon functional gains made in therapy. Baseline:  Goal status: INITIAL  2.  FGA goal to be written as appropriate. Increase FGA score to >/= 26/30 to reduce fall risk and increase safety with ambulation. Baseline: score 21/30 on 02-02-24 Goal status: INITIAL  3.  Vestibular goal to be written as appropriate.  Baseline: goal not needed Goal status: N/A  4.  Pt will improve 30 second chair stand to at least 11 sit <> stands in order to demo above norm for age.   Baseline: 9 sit <> stands with no UE support Goal status: INITIAL  5.  Pt will improve gait speed with no AD vs. LRAD to at least 2.9 ft/sec in order to demo improved community mobility.   Baseline: 12.4 seconds with no AD = 2.65 ft/sec  Goal status: INITIAL    ASSESSMENT:  CLINICAL IMPRESSION: Today's skilled session focused on BLE strengthening, quadruped/half kneeling exercises, and working on floor transfers. Pt able  to get down to the floor mod I with BUE support. When coming up to stand, pt needing to bring  LLE forwards as the stronger leg, pt unable to stand when bringing RLE forwards. Pt requesting some of the exercises on the floor to work on at home for strengthening. Will continue per POC.     OBJECTIVE IMPAIRMENTS: Abnormal gait, cardiopulmonary status limiting activity, decreased activity tolerance, decreased balance, decreased endurance, decreased mobility, difficulty walking, decreased strength, dizziness, postural dysfunction, and pain.   ACTIVITY LIMITATIONS: transfers and locomotion level  PARTICIPATION LIMITATIONS: community activity  PERSONAL FACTORS: Age, Behavior pattern, Past/current experiences, Time since onset of injury/illness/exacerbation, and 3+ comorbidities: AAA, COPD, CAD, fibromyalgia, HLD, HTN, hx of MI, PAD, lumbar spinal surgery 03/24/22, history of severe spinal stenosis at L4-5 status post decompression and fusion from L3-L5 levels. Now with worsening spinal stenosis at L2-3 level.   are also affecting patient's functional outcome.   REHAB POTENTIAL: Good  CLINICAL DECISION MAKING: Evolving/moderate complexity  EVALUATION COMPLEXITY: Moderate  PLAN:  PT FREQUENCY: 2x/week  PT DURATION: 8 weeks - anticipate 6 weeks   PLANNED INTERVENTIONS: 97164- PT Re-evaluation, 97110-Therapeutic exercises, 97530- Therapeutic activity, 97112- Neuromuscular re-education, 97535- Self Care, 82956- Manual therapy, [phone]- Gait training, [phone]- Canalith repositioning, [phone]- Aquatic Therapy, Patient/Family education, Balance training, Stair training, Vestibular training, and DME instructions  PLAN FOR NEXT SESSION:   practice floor transfers, try exercises with ankle weights   strengthening and balance exercises     Drake Leach, PT, DPT 02/21/2024, 10:16 AM

## 2024-02-23 ENCOUNTER — Ambulatory Visit: Payer: Medicare Other | Admitting: Physical Therapy

## 2024-02-23 DIAGNOSIS — M6281 Muscle weakness (generalized): Secondary | ICD-10-CM | POA: Diagnosis not present

## 2024-02-23 DIAGNOSIS — R2681 Unsteadiness on feet: Secondary | ICD-10-CM

## 2024-02-23 DIAGNOSIS — R2689 Other abnormalities of gait and mobility: Secondary | ICD-10-CM

## 2024-02-23 NOTE — Therapy (Signed)
 OUTPATIENT PHYSICAL THERAPY NEURO TREATMENT NOTE   Patient Name: Megan [name] MRN: [id number] DOB:1946/01/08, 78 y.o., female Today's Date: 02/23/2024   PCP: Willow Ora, MD  REFERRING PROVIDER: Suanne Marker, MD  END OF SESSION:  PT End of Session - 02/23/24 0931     Visit Number 9    Number of Visits 13    Date for PT Re-Evaluation 03/24/24    Authorization Type [employer]    Authorization Time Period 01-24-24 - 03-20-24    Authorization - Number of Visits 17    PT Start Time 0930    PT Stop Time 1012    PT Time Calculation (min) 42 min    Equipment Utilized During Treatment Gait belt    Activity Tolerance Patient tolerated treatment well    Behavior During Therapy WFL for tasks assessed/performed                  Past Medical History:  Diagnosis Date   AAA (abdominal aortic aneurysm) (HCC)    a. s/p stent grafting in 2009.   Adenomatous colon polyp 05/2001   Adenomatous duodenal polyp    Allergy    Anginal pain (HCC)    COPD (chronic obstructive pulmonary disease) (HCC)    pt denies COPD   Coronary artery disease    a. s/p CABG in 1995;  b. 05/2013 Neg MV, EF 82%;  c. 06/2013 Cath: LM nl, LAD 40-50p, LCX nl, RCA 80p/13m, VG->RCA->PDA->PLSA min irregs, LIMA->LAD atretic, vigorous LV fxn.   Diabetes mellitus without complication (HCC)    history of, resolved. 2017   Diverticulosis    Eczema, dyshidrotic    Eczema, dyshidrotic 1986   Dr. Gerilyn Nestle   Emphysema of lung [facility])    Fatty liver    Fibromyalgia    GERD (gastroesophageal reflux disease)    Hyperlipidemia    Hypertension    Migraine    Myocardial infarction [facility])    Osteoarthritis    Osteoporosis    unsure, having a bone scan soon   Peripheral arterial disease (HCC)    left leg diagnosed by Dr Patty Sermons   Renal artery stenosis [facility])    a. 2008 s/p PTA   Past Surgical History:  Procedure Laterality Date   ABDOMINAL AORTAGRAM N/A 03/06/2014   Procedure: ABDOMINAL  AORTAGRAM;  Surgeon: Larina Earthly, MD;  Location: [facility] CATH LAB;  Service: Cardiovascular;  Laterality: N/A;   ABDOMINAL AORTIC ANEURYSM REPAIR  2009   stenting   ABDOMINAL HYSTERECTOMY     Total, 1992   BREAST BIOPSY     CARDIAC CATHETERIZATION     CARDIOVASCULAR STRESS TEST  11/11/2009   normal study   CARPAL TUNNEL RELEASE  08/1993   left wrist   CARPAL TUNNEL RELEASE  01/1997   right   CATARACT EXTRACTION, BILATERAL  2021   CORONARY ARTERY BYPASS GRAFT  07/1994   Triple by Dr Ofilia Neas   CORONARY STENT INTERVENTION N/A 02/02/2021   Procedure: CORONARY STENT INTERVENTION;  Surgeon: Yvonne Kendall, MD;  Location: [facility] INVASIVE CV LAB;  Service: Cardiovascular;  Laterality: N/A;   CORONARY STENT INTERVENTION N/A 06/26/2021   Procedure: CORONARY STENT INTERVENTION;  Surgeon: Tonny Bollman, MD;  Location: [facility] INVASIVE CV LAB;  Service: Cardiovascular;  Laterality: N/A;   CORONARY ULTRASOUND/IVUS N/A 06/26/2021   Procedure: Intravascular Ultrasound/IVUS;  Surgeon: Tonny Bollman, MD;  Location: [facility] INVASIVE CV LAB;  Service: Cardiovascular;  Laterality: N/A;   CORONARY ULTRASOUND/IVUS N/A 09/03/2021  Procedure: Intravascular Ultrasound/IVUS;  Surgeon: Orbie Pyo, MD;  Location: [facility] INVASIVE CV LAB;  Service: Cardiovascular;  Laterality: N/A;   dental implants     EYE SURGERY  03/2020   cataract   HAND SURGERY Right 09/2002   Right hand pulley release   LAMINECTOMY WITH POSTERIOR LATERAL ARTHRODESIS LEVEL 1 N/A 03/24/2022   Procedure: LAMINECTOMY, FORAMINOTOMY, LUMBAR TWO- THREE ; INTERTRANSVERSE FUSION LUMBAR TWO - LUMBAR THREE RIGHT;  Surgeon: Tia Alert, MD;  Location: [facility] OR;  Service: Neurosurgery;  Laterality: N/A;   LEFT HEART CATH AND CORONARY ANGIOGRAPHY N/A 06/26/2021   Procedure: LEFT HEART CATH AND CORONARY ANGIOGRAPHY;  Surgeon: Tonny Bollman, MD;  Location: [facility] INVASIVE CV LAB;  Service: Cardiovascular;  Laterality: N/A;   LEFT HEART CATH AND CORS/GRAFTS ANGIOGRAPHY N/A  02/02/2021   Procedure: LEFT HEART CATH AND CORS/GRAFTS ANGIOGRAPHY;  Surgeon: Yvonne Kendall, MD;  Location: [facility] INVASIVE CV LAB;  Service: Cardiovascular;  Laterality: N/A;   LEFT HEART CATH AND CORS/GRAFTS ANGIOGRAPHY N/A 09/03/2021   Procedure: LEFT HEART CATH AND CORS/GRAFTS ANGIOGRAPHY;  Surgeon: Orbie Pyo, MD;  Location: [facility] INVASIVE CV LAB;  Service: Cardiovascular;  Laterality: N/A;   LEFT HEART CATH AND CORS/GRAFTS ANGIOGRAPHY N/A 07/15/2022   Procedure: LEFT HEART CATH AND CORS/GRAFTS ANGIOGRAPHY;  Surgeon: Swazil[place], Peter M, MD;  Location: [facility] INVASIVE CV LAB;  Service: Cardiovascular;  Laterality: N/A;   LEFT HEART CATHETERIZATION WITH CORONARY/GRAFT ANGIOGRAM N/A 06/11/2013   Procedure: LEFT HEART CATHETERIZATION WITH Isabel Caprice;  Surgeon: Vesta Mixer, MD;  Location: [facility] CATH LAB;  Service: Cardiovascular;  Laterality: N/A;   MULTIPLE TOOTH EXTRACTIONS  2000   All upper teeth.  Has full dneture.    RENAL ARTERY STENT  2008   SHOULDER ARTHROSCOPY WITH ROTATOR CUFF REPAIR Right 09/27/2018   Procedure: Right shoulder mini open rotator cuff repair;  Surgeon: Jene Every, MD;  Location: [facility] ORS;  Service: Orthopedics;  Laterality: Right;  90 mins   thyroid cyst apiration  05/1997 and 07/1998   TONSILECTOMY, ADENOIDECTOMY, BILATERAL MYRINGOTOMY AND TUBES  1989   TONSILLECTOMY     TOTAL ABDOMINAL HYSTERECTOMY  1992   Patient Active Problem List   Diagnosis Date Noted   Oxygen dependent 04/29/2023   Centrilobular emphysema (HCC) 02/17/2023   Chronic respiratory failure (HCC) 02/17/2023   Pneumonia 02/17/2023   Senile purpura (HCC) 12/22/2022   Daily consumption of alcohol 12/22/2022   S/P lumbar laminectomy 03/24/2022   Thyroid nodule greater than or equal to 1.5 cm in diameter incidentally noted on imaging study 08/27/2021   Angina pectoris (HCC) 06/25/2021   History of compression fracture of spine - lumbar 06/19/2021   History of lumbosacral spine surgery  03/03/2021   NSTEMI (non-ST elevated myocardial infarction) (HCC) 2022 01/31/2021   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/26/2021   Neuropathic pain 12/05/2020   Gait instability 09/09/2020   Lumbar stenosis without neurogenic claudication 02/26/2020   COPD with chronic bronchitis (HCC) 09/28/2018   Essential (primary) hypertension 07/10/2018   IFG (impaired fasting glucose) 11/10/2017   DJD (degenerative joint disease), lumbosacral 11/10/2017   Fibromyalgia 11/10/2017   Dyshidrotic eczema 11/10/2017   GERD (gastroesophageal reflux disease) 11/10/2017   Osteopenia 11/10/2017   Does use hearing aid 11/10/2017   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   Fatty liver 11/10/2017   Coronary artery disease    Peripheral vascular disease with claudication (HCC)    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   History of abdominal aortic aneurysm repair 05/18/2011   History of  renal artery stenosis 05/18/2011   Hyperlipidemia LDL goal <70     ONSET DATE: 12/14/2023  REFERRING DIAG: R53.1 (ICD-10-CM) - Weakness H55.00 (ICD-10-CM) - Nystagmus H81.4 (ICD-10-CM) - Central positional vertigo  THERAPY DIAG:  Muscle weakness (generalized)  Unsteadiness on feet  Other abnormalities of gait and mobility  Rationale for Evaluation and Treatment: Rehabilitation  SUBJECTIVE:                                                                                                                                                                                             SUBJECTIVE STATEMENT: Feeling a little more sore with her exercises.    Pt accompanied by: self  PERTINENT HISTORY: PMH: AAA, COPD, CAD, fibromyalgia, HLD, HTN, hx of MI, PAD, lumbar spinal surgery 03/24/22, history of severe spinal stenosis at L4-5 status post decompression and fusion from L3-L5 levels. Now with worsening spinal stenosis at L2-3 level.   Per Dr. Marjory Lies: Gait difficulty likely related to hip flexor weakness associated with L2-3  spinal stenosis.   PAIN:  Are you having pain? Yes, in pain most of the time, has fibromyalgia, on oxycodone - 6/10 generalized pain; on oxycodone and aspirin  PRECAUTIONS: Fall   FALLS: Has patient fallen in last 6 months? No  LIVING ENVIRONMENT: Lives with: lives alone, husband in a nursing house  Lives in: House/apartment Stairs: 3rd floor apartment, can use an elevator  Has following equipment at home: Single point cane, Environmental consultant - 4 wheeled, and Grab bars  PLOF: Independent and Independent with community mobility with device  PATIENT GOALS: Wants to work on her strength, re-assess for BPPV, working on some balance    OBJECTIVE:  Note: Objective measures were completed at Evaluation unless otherwise noted.  DIAGNOSTIC FINDINGS: 02/20/23: IMPRESSION:    Unremarkable MRI brain (with and without).  No acute findings.    COGNITION: Overall cognitive status: Within functional limits for tasks assessed   SENSATION: Light touch: WFL  POSTURE: forward head   LOWER EXTREMITY MMT:    MMT Right Eval Left Eval  Hip flexion 3 3  Hip extension    Hip abduction 4+ 4+  Hip adduction 4+ 4+  Hip internal rotation    Hip external rotation    Knee flexion 4- 4  Knee extension 5 5  Ankle dorsiflexion    Ankle plantarflexion    Ankle inversion    Ankle eversion    (Blank rows = not tested)  All tested in sitting    TRANSFERS: Assistive device utilized: None  Sit to stand: Modified independence Stand to sit: Modified independence    Performs with wide  BOS   GAIT: Gait pattern: step through pattern Distance walked: Clinic distances  Assistive device utilized: Single point cane and None Level of assistance: Modified independence and SBA Comments: No overt unsteadiness noted, but some path deviation noted with no AD    FUNCTIONAL TESTS:  5 times sit to stand: 17.3 seconds with no UE support  30 seconds chair stand test: 9 sit <> stands with no UE support, pt  reporting 4-5/10 RPE (<10 below normal for age) Gait speed: 12.4 seconds with no AD = 2.65 ft/sec     M-CTSIB  Condition 1: Firm Surface, EO 30 Sec, Normal Sway  Condition 2: Firm Surface, EC 30 Sec, Normal Sway  Condition 3: Foam Surface, EO 30 Sec, Mild Sway  Condition 4: Foam Surface, EC 30 Sec, Mild Sway                                                                                          TREATMENT DATE: 02/23/24  Therapeutic Activity: Floor transfers Pt gets down to floor with mod I using BUE support on mat table  Worked on getting up from the floor in a half kneeling position, pt stands up with LLE forwards and pressing up with BUE support (on chair and mat table), practiced 3 reps of this with pt performing with supervision Showed pt where to purchase 2-3# ankle weights online as pt reports doing standing strengthening exercises feels better with ankle weights instead of t-band   Therapeutic Exercise:  Half kneeling: Trunk rotations holding 3# ball 2 x 10 reps each side, cued to look at ball when turning head, CGA at times for balance  With 2# ankle weights 2 x 10 reps alternating marching, no UE support with focus on slowed pace  2 x 10 reps hip ABD each leg, initial cues for proper technique  2 x 10 reps hip extension each leg, initial cues for proper technique  2 x 10 reps LAQs with 3 second hold  10 reps hip flexion/ABD step over 10# kettle bell, cues for incr hip flexion   PATIENT EDUCATION: Education details: See therapeutic activity section, can break exercises up instead of performing them all at once, taking rest days when sore after exercises  Person educated: Patient Education method: Explanation, Demonstration, Verbal cues, and Handouts Education comprehension: verbalized understanding and returned demonstration  HOME EXERCISE PROGRAM: Access Code: [email or web address] URL: [email or web address] Date: 02/21/2024 Prepared by: Sherlie Ban  Exercises - Seated Hamstring Curl with Anchored Resistance  - 1 x daily - 7 x weekly - 3 sets - 10 reps - Sitting Knee Extension with Resistance  - 1 x daily - 7 x weekly - 3 sets - 10 reps  - Quadruped Alternating Leg Extensions  - 1 x daily - 5 x weekly - 2 sets - 10 reps - Quadruped Hip Abduction and External Rotation  - 1 x daily - 5 x weekly - 2 sets - 10 reps - Half Kneeling Hip Flexor Stretch  - 1 x daily - 7 x weekly  In supine position; Bridging x 5 reps - attempted to perform with ball between knees but this  caused a cramp in Lt hamstring so ball was removed Bridging with marching 5 reps Bridging with LE extension 5 reps Bridging with hip abduction/adduction 5 reps  SLR RLE and LLE 10 reps - no weight  Hip abduction - RLE and LLE - 10 reps each leg Clam shell exercise 10 reps each leg  HEP: Feet Together (Compliant Surface) Head Motion - Eyes Closed    Stand on compliant surface: __foam pad______ with feet together. Close eyes and move head slowly, up and down. Repeat __1__ times per session. Do __1__ sessions per day.          TherEx:   Performed standing hip flexion, abduction and extension with red theraband 10 reps each leg Performed standing hip flexion with knee flexed at 90 degrees 10 reps each leg with red theraband   issued Medbridge HEP for bil. LE strengthening - pt performed the following exercises 10 reps each  Access Code: [email or web address] URL: [email or web address] Date: 02/01/2024 Prepared by: Maebelle Munroe  Exercises - Bridge  - 1 x daily - 7 x weekly - 1 sets - 10 reps - 5 sec hold hold - Supine Active Straight Leg Raise  - 1 x daily - 7 x weekly - 3 sets - 10 reps - Sidelying Hip Abduction  - 1 x daily - 7 x weekly - 3 sets - 10 reps - Sit to Stand  - 1 x daily - 7 x weekly - 1 sets - 10 reps  GOALS: Goals reviewed with patient? Yes  SHORT TERM GOALS: Target date: 02/15/2024  Pt will be independent with initial HEP  for strength/balance in order to build upon functional gains made in therapy. Baseline: Goal status: MET  2.  Further vestibular assessment to be written with LTG updated Baseline: goal not needed Goal status: N/A  3.  FGA to be assessed with LTG written.  Baseline: score 21/30 on 02-02-24 Goal status: Goal met 02-02-24  4.  Pt will improve 5x sit<>stand to less than or equal to 15 sec to demonstrate improved functional strength and transfer efficiency.  Baseline: 17.3 seconds with no UE support   11.2 seconds with no UE support  Goal status: MET    LONG TERM GOALS: Target date: 03/07/2024  Pt will be independent with final HEP for strength/balance in order to build upon functional gains made in therapy. Baseline:  Goal status: INITIAL  2.  FGA goal to be written as appropriate. Increase FGA score to >/= 26/30 to reduce fall risk and increase safety with ambulation. Baseline: score 21/30 on 02-02-24 Goal status: INITIAL  3.  Vestibular goal to be written as appropriate.  Baseline: goal not needed Goal status: N/A  4.  Pt will improve 30 second chair stand to at least 11 sit <> stands in order to demo above norm for age.   Baseline: 9 sit <> stands with no UE support Goal status: INITIAL  5.  Pt will improve gait speed with no AD vs. LRAD to at least 2.9 ft/sec in order to demo improved community mobility.   Baseline: 12.4 seconds with no AD = 2.65 ft/sec  Goal status: INITIAL    ASSESSMENT:  CLINICAL IMPRESSION: Practiced floor transfers again with pt able to get down to floor with BUE support and mod I. Pt able to stand back up from half kneel position with LLE leading with BUE support and supervision/mod I. Remainder of session worked on standing BLE strengthening. Pt felt better with standing strengthening exercises  with ankle weights instead of t-band (the band irritated pt's bursitis). So showed pt where to purchase for use with HEP. Will continue per POC.      OBJECTIVE IMPAIRMENTS: Abnormal gait, cardiopulmonary status limiting activity, decreased activity tolerance, decreased balance, decreased endurance, decreased mobility, difficulty walking, decreased strength, dizziness, postural dysfunction, and pain.   ACTIVITY LIMITATIONS: transfers and locomotion level  PARTICIPATION LIMITATIONS: community activity  PERSONAL FACTORS: Age, Behavior pattern, Past/current experiences, Time since onset of injury/illness/exacerbation, and 3+ comorbidities: AAA, COPD, CAD, fibromyalgia, HLD, HTN, hx of MI, PAD, lumbar spinal surgery 03/24/22, history of severe spinal stenosis at L4-5 status post decompression and fusion from L3-L5 levels. Now with worsening spinal stenosis at L2-3 level.   are also affecting patient's functional outcome.   REHAB POTENTIAL: Good  CLINICAL DECISION MAKING: Evolving/moderate complexity  EVALUATION COMPLEXITY: Moderate  PLAN:  PT FREQUENCY: 2x/week  PT DURATION: 8 weeks - anticipate 6 weeks   PLANNED INTERVENTIONS: 97164- PT Re-evaluation, 97110-Therapeutic exercises, 97530- Therapeutic activity, [id number]- Neuromuscular re-education, 97535- Self Care, [id number]- Manual therapy, [phone]- Gait training, [phone]- Canalith repositioning, [phone]- Aquatic Therapy, Patient/Family education, Balance training, Stair training, Vestibular training, and DME instructions  PLAN FOR NEXT SESSION:     strengthening and balance exercises     Drake Leach, PT, DPT 02/23/2024, 10:15 AM

## 2024-02-28 ENCOUNTER — Ambulatory Visit: Payer: Medicare Other | Admitting: Physical Therapy

## 2024-02-28 ENCOUNTER — Encounter: Payer: Self-pay | Admitting: Physical Therapy

## 2024-02-28 DIAGNOSIS — R2681 Unsteadiness on feet: Secondary | ICD-10-CM

## 2024-02-28 DIAGNOSIS — R2689 Other abnormalities of gait and mobility: Secondary | ICD-10-CM | POA: Diagnosis not present

## 2024-02-28 DIAGNOSIS — M6281 Muscle weakness (generalized): Secondary | ICD-10-CM | POA: Diagnosis not present

## 2024-02-28 NOTE — Therapy (Signed)
 OUTPATIENT PHYSICAL THERAPY NEURO TREATMENT NOTE/10th VISIT PROGRESS NOTE    Progress Note  Reporting Period 01-24-24 to 02-28-24  See note below for Objective Data and Assessment of Progress/Goals. Thank you for the referral of this patient.      Patient Name: Megan [name] MRN: [id number] DOB:October 30, 1946, 78 y.o., female Today's Date: 02/28/2024   PCP: Willow Ora, MD  REFERRING PROVIDER: Suanne Marker, MD  END OF SESSION:  PT End of Session - 02/28/24 1345     Visit Number 10    Number of Visits 13    Date for PT Re-Evaluation 03/24/24    Authorization Type [employer]    Authorization Time Period 01-24-24 - 03-20-24    Authorization - Visit Number 10    Authorization - Number of Visits 17    PT Start Time 0928    PT Stop Time 1017    PT Time Calculation (min) 49 min    Activity Tolerance Patient tolerated treatment well    Behavior During Therapy [facility] for tasks assessed/performed                   Past Medical History:  Diagnosis Date   AAA (abdominal aortic aneurysm) (HCC)    a. s/p stent grafting in 2009.   Adenomatous colon polyp 05/2001   Adenomatous duodenal polyp    Allergy    Anginal pain (HCC)    COPD (chronic obstructive pulmonary disease) (HCC)    pt denies COPD   Coronary artery disease    a. s/p CABG in 1995;  b. 05/2013 Neg MV, EF 82%;  c. 06/2013 Cath: LM nl, LAD 40-50p, LCX nl, RCA 80p/121m, VG->RCA->PDA->PLSA min irregs, LIMA->LAD atretic, vigorous LV fxn.   Diabetes mellitus without complication (HCC)    history of, resolved. 2017   Diverticulosis    Eczema, dyshidrotic    Eczema, dyshidrotic 1986   Dr. Gerilyn Nestle   Emphysema of lung [facility])    Fatty liver    Fibromyalgia    GERD (gastroesophageal reflux disease)    Hyperlipidemia    Hypertension    Migraine    Myocardial infarction [facility])    Osteoarthritis    Osteoporosis    unsure, having a bone scan soon   Peripheral arterial disease (HCC)    left  leg diagnosed by Dr Patty Sermons   Renal artery stenosis [facility])    a. 2008 s/p PTA   Past Surgical History:  Procedure Laterality Date   ABDOMINAL AORTAGRAM N/A 03/06/2014   Procedure: ABDOMINAL AORTAGRAM;  Surgeon: Larina Earthly, MD;  Location: [facility] CATH LAB;  Service: Cardiovascular;  Laterality: N/A;   ABDOMINAL AORTIC ANEURYSM REPAIR  2009   stenting   ABDOMINAL HYSTERECTOMY     Total, 1992   BREAST BIOPSY     CARDIAC CATHETERIZATION     CARDIOVASCULAR STRESS TEST  11/11/2009   normal study   CARPAL TUNNEL RELEASE  08/1993   left wrist   CARPAL TUNNEL RELEASE  01/1997   right   CATARACT EXTRACTION, BILATERAL  2021   CORONARY ARTERY BYPASS GRAFT  07/1994   Triple by Dr Ofilia Neas   CORONARY STENT INTERVENTION N/A 02/02/2021   Procedure: CORONARY STENT INTERVENTION;  Surgeon: Yvonne Kendall, MD;  Location: [facility] INVASIVE CV LAB;  Service: Cardiovascular;  Laterality: N/A;   CORONARY STENT INTERVENTION N/A 06/26/2021   Procedure: CORONARY STENT INTERVENTION;  Surgeon: Tonny Bollman, MD;  Location: [facility] INVASIVE CV LAB;  Service: Cardiovascular;  Laterality:  N/A;   CORONARY ULTRASOUND/IVUS N/A 06/26/2021   Procedure: Intravascular Ultrasound/IVUS;  Surgeon: Tonny Bollman, MD;  Location: [facility] INVASIVE CV LAB;  Service: Cardiovascular;  Laterality: N/A;   CORONARY ULTRASOUND/IVUS N/A 09/03/2021   Procedure: Intravascular Ultrasound/IVUS;  Surgeon: Orbie Pyo, MD;  Location: [facility] INVASIVE CV LAB;  Service: Cardiovascular;  Laterality: N/A;   dental implants     EYE SURGERY  03/2020   cataract   HAND SURGERY Right 09/2002   Right hand pulley release   LAMINECTOMY WITH POSTERIOR LATERAL ARTHRODESIS LEVEL 1 N/A 03/24/2022   Procedure: LAMINECTOMY, FORAMINOTOMY, LUMBAR TWO- THREE ; INTERTRANSVERSE FUSION LUMBAR TWO - LUMBAR THREE RIGHT;  Surgeon: Tia Alert, MD;  Location: [facility] OR;  Service: Neurosurgery;  Laterality: N/A;   LEFT HEART CATH AND CORONARY ANGIOGRAPHY N/A 06/26/2021   Procedure: LEFT  HEART CATH AND CORONARY ANGIOGRAPHY;  Surgeon: Tonny Bollman, MD;  Location: [facility] INVASIVE CV LAB;  Service: Cardiovascular;  Laterality: N/A;   LEFT HEART CATH AND CORS/GRAFTS ANGIOGRAPHY N/A 02/02/2021   Procedure: LEFT HEART CATH AND CORS/GRAFTS ANGIOGRAPHY;  Surgeon: Yvonne Kendall, MD;  Location: [facility] INVASIVE CV LAB;  Service: Cardiovascular;  Laterality: N/A;   LEFT HEART CATH AND CORS/GRAFTS ANGIOGRAPHY N/A 09/03/2021   Procedure: LEFT HEART CATH AND CORS/GRAFTS ANGIOGRAPHY;  Surgeon: Orbie Pyo, MD;  Location: [facility] INVASIVE CV LAB;  Service: Cardiovascular;  Laterality: N/A;   LEFT HEART CATH AND CORS/GRAFTS ANGIOGRAPHY N/A 07/15/2022   Procedure: LEFT HEART CATH AND CORS/GRAFTS ANGIOGRAPHY;  Surgeon: [place], Peter M, MD;  Location[facility] INVASIVE CV LAB;  Service: Cardiovascular;  Laterality: N/A;   LEFT HEART CATHETERIZATION WITH CORONARY/GRAFT ANGIOGRAM N/A 06/11/2013   Procedure: LEFT HEART CATHETERIZATION WITH Isabel Caprice;  Surgeon: Vesta Mixer, MD;  Location: [facility] CATH LAB;  Service: Cardiovascular;  Laterality: N/A;   MULTIPLE TOOTH EXTRACTIONS  2000   All upper teeth.  Has full dneture.    RENAL ARTERY STENT  2008   SHOULDER ARTHROSCOPY WITH ROTATOR CUFF REPAIR Right 09/27/2018   Procedure: Right shoulder mini open rotator cuff repair;  Surgeon: Jene Every, MD;  Location: [facility] ORS;  Service: Orthopedics;  Laterality: Right;  90 mins   thyroid cyst apiration  05/1997 and 07/1998   TONSILECTOMY, ADENOIDECTOMY, BILATERAL MYRINGOTOMY AND TUBES  1989   TONSILLECTOMY     TOTAL ABDOMINAL HYSTERECTOMY  1992   Patient Active Problem List   Diagnosis Date Noted   Oxygen dependent 04/29/2023   Centrilobular emphysema (HCC) 02/17/2023   Chronic respiratory failure (HCC) 02/17/2023   Pneumonia 02/17/2023   Senile purpura (HCC) 12/22/2022   Daily consumption of alcohol 12/22/2022   S/P lumbar laminectomy 03/24/2022   Thyroid nodule greater than or equal to 1.5 cm in diameter  incidentally noted on imaging study 08/27/2021   Angina pectoris (HCC) 06/25/2021   History of compression fracture of spine - lumbar 06/19/2021   History of lumbosacral spine surgery 03/03/2021   NSTEMI (non-ST elevated myocardial infarction) (HCC) 2022 01/31/2021   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/26/2021   Neuropathic pain 12/05/2020   Gait instability 09/09/2020   Lumbar stenosis without neurogenic claudication 02/26/2020   COPD with chronic bronchitis (HCC) 09/28/2018   Essential (primary) hypertension 07/10/2018   IFG (impaired fasting glucose) 11/10/2017   DJD (degenerative joint disease), lumbosacral 11/10/2017   Fibromyalgia 11/10/2017   Dyshidrotic eczema 11/10/2017   GERD (gastroesophageal reflux disease) 11/10/2017   Osteopenia 11/10/2017   Does use hearing aid 11/10/2017   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  Fatty liver 11/10/2017   Coronary artery disease    Peripheral vascular disease with claudication (HCC)    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   History of abdominal aortic aneurysm repair 05/18/2011   History of renal artery stenosis 05/18/2011   Hyperlipidemia LDL goal <70     ONSET DATE: 12/14/2023  REFERRING DIAG: R53.1 (ICD-10-CM) - Weakness H55.00 (ICD-10-CM) - Nystagmus H81.4 (ICD-10-CM) - Central positional vertigo  THERAPY DIAG:  Muscle weakness (generalized)  Unsteadiness on feet  Rationale for Evaluation and Treatment: Rehabilitation  SUBJECTIVE:                                                                                                                                                                                             SUBJECTIVE STATEMENT: Pt reports she is not having as much pain today as she was last week - took oxycodone before coming to PT today. Pt requests to finish up land exercises next week and then do a few more pool sessions since she has only done 1 aquatic session to date  Pt accompanied by: self  PERTINENT  HISTORY: PMH: AAA, COPD, CAD, fibromyalgia, HLD, HTN, hx of MI, PAD, lumbar spinal surgery 03/24/22, history of severe spinal stenosis at L4-5 status post decompression and fusion from L3-L5 levels. Now with worsening spinal stenosis at L2-3 level.   Per Dr. Marjory Lies: Gait difficulty likely related to hip flexor weakness associated with L2-3 spinal stenosis.   PAIN:  Are you having pain? Yes, in pain most of the time, has fibromyalgia, on oxycodone - 3/10 generalized pain; on oxycodone and aspirin  PRECAUTIONS: Fall   FALLS: Has patient fallen in last 6 months? No  LIVING ENVIRONMENT: Lives with: lives alone, husband in a nursing house  Lives in: House/apartment Stairs: 3rd floor apartment, can use an elevator  Has following equipment at home: Single point cane, Environmental consultant - 4 wheeled, and Grab bars  PLOF: Independent and Independent with community mobility with device  PATIENT GOALS: Wants to work on her strength, re-assess for BPPV, working on some balance    OBJECTIVE:  Note: Objective measures were completed at Evaluation unless otherwise noted.  DIAGNOSTIC FINDINGS: 02/20/23: IMPRESSION:    Unremarkable MRI brain (with and without).  No acute findings.    COGNITION: Overall cognitive status: Within functional limits for tasks assessed   SENSATION: Light touch: WFL  POSTURE: forward head   LOWER EXTREMITY MMT:    MMT Right Eval Left Eval  Hip flexion 3 3  Hip extension    Hip abduction 4+ 4+  Hip adduction 4+ 4+  Hip internal rotation    Hip external  rotation    Knee flexion 4- 4  Knee extension 5 5  Ankle dorsiflexion    Ankle plantarflexion    Ankle inversion    Ankle eversion    (Blank rows = not tested)  All tested in sitting    TRANSFERS: Assistive device utilized: None  Sit to stand: Modified independence Stand to sit: Modified independence    Performs with wide BOS   GAIT: Gait pattern: step through pattern Distance walked: Clinic  distances  Assistive device utilized: Single point cane and None Level of assistance: Modified independence and SBA Comments: No overt unsteadiness noted, but some path deviation noted with no AD    FUNCTIONAL TESTS:  5 times sit to stand: 17.3 seconds with no UE support  30 seconds chair stand test: 9 sit <> stands with no UE support, pt reporting 4-5/10 RPE (<10 below normal for age) Gait speed: 12.4 seconds with no AD = 2.65 ft/sec     M-CTSIB  Condition 1: Firm Surface, EO 30 Sec, Normal Sway  Condition 2: Firm Surface, EC 30 Sec, Normal Sway  Condition 3: Foam Surface, EO 30 Sec, Mild Sway  Condition 4: Foam Surface, EC 30 Sec, Mild Sway                                                                                          TREATMENT DATE: 02-28-24  Therapeutic Exercise: Sit to stand from mat without UE support -  5 reps - feet on floor Bridging 5 reps with ball between knees; bridging with marching 5 reps;  bridging with LE extension 5 reps each leg (pt able to stay bridged and extend each leg); bridging with hip abdct./adduction 5 reps  Standing hip extension RLE and LLE 2# weight each leg - 10 reps - 4 sec hold  Tall kneeling - mini squats 10 reps; pt lifted 5# kettlebell straight up (in approx. 90 degree flexion) 5 reps with hip extension in return to tall kneeling:  diagonal lifts with 5# kettlebell with minisquats in tall kneeling 5 reps each direction "X" Bird dog exercise - quadruped - lifting contralateral UE/LE 3 reps each side 5 sec hold  TherAct: Step up exercise RLE and LLE 10 reps onto 6" step - UE support on hand rails for balance Lateral step up exercise onto 6" step 10 reps each leg  2# weight placed on each leg - tap ups to 1st step 5 reps each leg, to 2nd step 10 reps with 2# weight on each leg  Marching with 2# weight 10 reps without UE support   Standing on inverted Bosu inside // bars - weight shifts laterally 10 reps; anterior/posterior weight  shifts 10 reps Pt stood on RLE - moved LLE slowly forward/back and then out/in 10 reps each direction for closed chain strengthening of stance leg; performed same exercise standing on LLE - moved RLE forward/back and then laterally  1/2 kneeling on mat on each leg - performed trunk rotation with hands clasped together 5 reps to each side  PATIENT EDUCATION: Education details: See therapeutic activity section, can break exercises up instead of performing them all at  once, taking rest days when sore after exercises  Person educated: Patient Education method: Explanation, Demonstration, Verbal cues, and Handouts Education comprehension: verbalized understanding and returned demonstration  HOME EXERCISE PROGRAM: Access Code: [email or web address] URL: [email or web address] Date: 02/21/2024 Prepared by: Sherlie Ban  Exercises - Seated Hamstring Curl with Anchored Resistance  - 1 x daily - 7 x weekly - 3 sets - 10 reps - Sitting Knee Extension with Resistance  - 1 x daily - 7 x weekly - 3 sets - 10 reps  - Quadruped Alternating Leg Extensions  - 1 x daily - 5 x weekly - 2 sets - 10 reps - Quadruped Hip Abduction and External Rotation  - 1 x daily - 5 x weekly - 2 sets - 10 reps - Half Kneeling Hip Flexor Stretch  - 1 x daily - 7 x weekly  In supine position; Bridging x 5 reps - attempted to perform with ball between knees but this caused a cramp in Lt hamstring so ball was removed Bridging with marching 5 reps Bridging with LE extension 5 reps Bridging with hip abduction/adduction 5 reps  SLR RLE and LLE 10 reps - no weight  Hip abduction - RLE and LLE - 10 reps each leg Clam shell exercise 10 reps each leg  HEP: Feet Together (Compliant Surface) Head Motion - Eyes Closed    Stand on compliant surface: __foam pad______ with feet together. Close eyes and move head slowly, up and down. Repeat __1__ times per session. Do __1__ sessions per day.          TherEx:    Performed standing hip flexion, abduction and extension with red theraband 10 reps each leg Performed standing hip flexion with knee flexed at 90 degrees 10 reps each leg with red theraband   issued Medbridge HEP for bil. LE strengthening - pt performed the following exercises 10 reps each  Access Code: [email or web address] URL: [email or web address] Date: 02/01/2024 Prepared by: Maebelle Munroe  Exercises - Bridge  - 1 x daily - 7 x weekly - 1 sets - 10 reps - 5 sec hold hold - Supine Active Straight Leg Raise  - 1 x daily - 7 x weekly - 3 sets - 10 reps - Sidelying Hip Abduction  - 1 x daily - 7 x weekly - 3 sets - 10 reps - Sit to Stand  - 1 x daily - 7 x weekly - 1 sets - 10 reps  GOALS: Goals reviewed with patient? Yes  SHORT TERM GOALS: Target date: 02/15/2024  Pt will be independent with initial HEP for strength/balance in order to build upon functional gains made in therapy. Baseline: Goal status: MET  2.  Further vestibular assessment to be written with LTG updated Baseline: goal not needed Goal status: N/A  3.  FGA to be assessed with LTG written.  Baseline: score 21/30 on 02-02-24 Goal status: Goal met 02-02-24  4.  Pt will improve 5x sit<>stand to less than or equal to 15 sec to demonstrate improved functional strength and transfer efficiency.  Baseline: 17.3 seconds with no UE support   11.2 seconds with no UE support  Goal status: MET    LONG TERM GOALS: Target date: 03/07/2024  Pt will be independent with final HEP for strength/balance in order to build upon functional gains made in therapy. Baseline:  Goal status: INITIAL  2.  FGA goal to be written as appropriate. Increase FGA score to >/= 26/30 to reduce fall risk and increase safety with ambulation. Baseline:  score 21/30 on 02-02-24 Goal status: INITIAL  3.  Vestibular goal to be written as appropriate.  Baseline: goal not needed Goal status: N/A  4.  Pt will improve 30 second chair stand to at  least 11 sit <> stands in order to demo above norm for age.   Baseline: 9 sit <> stands with no UE support Goal status: INITIAL  5.  Pt will improve gait speed with no AD vs. LRAD to at least 2.9 ft/sec in order to demo improved community mobility.   Baseline: 12.4 seconds with no AD = 2.65 ft/sec  Goal status: INITIAL    ASSESSMENT:  CLINICAL IMPRESSION: This 10th visit progress note covers dates 01-24-24 - 02-28-24.  Pt has met STG's #1,3 and 4:  STG #2 N/A as no vestibular goal needed.  PT session focused on functional strengthening exercises for bil. LE's and core.  Pt able to perform PRE's with 2# weight for hip strengthening. Pt needed intermittent minimal UE support for assist with balance recovery but demonstrated good balance overall with exercises requiring SLS.  Continue per POC.     OBJECTIVE IMPAIRMENTS: Abnormal gait, cardiopulmonary status limiting activity, decreased activity tolerance, decreased balance, decreased endurance, decreased mobility, difficulty walking, decreased strength, dizziness, postural dysfunction, and pain.   ACTIVITY LIMITATIONS: transfers and locomotion level  PARTICIPATION LIMITATIONS: community activity  PERSONAL FACTORS: Age, Behavior pattern, Past/current experiences, Time since onset of injury/illness/exacerbation, and 3+ comorbidities: AAA, COPD, CAD, fibromyalgia, HLD, HTN, hx of MI, PAD, lumbar spinal surgery 03/24/22, history of severe spinal stenosis at L4-5 status post decompression and fusion from L3-L5 levels. Now with worsening spinal stenosis at L2-3 level.   are also affecting patient's functional outcome.   REHAB POTENTIAL: Good  CLINICAL DECISION MAKING: Evolving/moderate complexity  EVALUATION COMPLEXITY: Moderate  PLAN:  PT FREQUENCY: 2x/week  PT DURATION: 8 weeks - anticipate 6 weeks   PLANNED INTERVENTIONS: 97164- PT Re-evaluation, 97110-Therapeutic exercises, 97530- Therapeutic activity, 97112- Neuromuscular  re-education, 97535- Self Care, 96045- Manual therapy, [phone]- Gait training, [phone]- Canalith repositioning, [phone]- Aquatic Therapy, Patient/Family education, Balance training, Stair training, Vestibular training, and DME instructions  PLAN FOR NEXT SESSION:  Land - check LTG's and renew for aquatic sessions (2x/week x 4 weeks)       Aquatic - gait and balance training    Vencent Hauschild, Donavan Burnet, PT 02/28/2024, 1:48 PM

## 2024-02-29 ENCOUNTER — Encounter: Payer: Self-pay | Admitting: Physical Therapy

## 2024-02-29 ENCOUNTER — Ambulatory Visit: Admitting: Physical Therapy

## 2024-02-29 DIAGNOSIS — M6281 Muscle weakness (generalized): Secondary | ICD-10-CM | POA: Diagnosis not present

## 2024-02-29 DIAGNOSIS — R2689 Other abnormalities of gait and mobility: Secondary | ICD-10-CM

## 2024-02-29 DIAGNOSIS — R2681 Unsteadiness on feet: Secondary | ICD-10-CM

## 2024-02-29 NOTE — Therapy (Signed)
 OUTPATIENT PHYSICAL THERAPY NEURO/AQUATIC TREATMENT NOTE   Patient Name: Megan [name] MRN: [id number] DOB:Sep 05, 1946, 78 y.o., female Today's Date: 02/29/2024   PCP: Willow Ora, MD  REFERRING PROVIDER: Suanne Marker, MD  END OF SESSION:  PT End of Session - 02/29/24 1856     Visit Number 11    Number of Visits 13    Date for PT Re-Evaluation 03/24/24    Authorization Type [employer]    Authorization Time Period 01-24-24 - 03-20-24    Authorization - Visit Number 11    Authorization - Number of Visits 17    PT Start Time 1310    PT Stop Time 1400    PT Time Calculation (min) 50 min    Equipment Utilized During Treatment Other (comment)   barbells, aquatic cuffs, aquatic step   Activity Tolerance Patient tolerated treatment well    Behavior During Therapy WFL for tasks assessed/performed                 Past Medical History:  Diagnosis Date   AAA (abdominal aortic aneurysm) (HCC)    a. s/p stent grafting in 2009.   Adenomatous colon polyp 05/2001   Adenomatous duodenal polyp    Allergy    Anginal pain (HCC)    COPD (chronic obstructive pulmonary disease) (HCC)    pt denies COPD   Coronary artery disease    a. s/p CABG in 1995;  b. 05/2013 Neg MV, EF 82%;  c. 06/2013 Cath: LM nl, LAD 40-50p, LCX nl, RCA 80p/147m, VG->RCA->PDA->PLSA min irregs, LIMA->LAD atretic, vigorous LV fxn.   Diabetes mellitus without complication (HCC)    history of, resolved. 2017   Diverticulosis    Eczema, dyshidrotic    Eczema, dyshidrotic 1986   Dr. Gerilyn Nestle   Emphysema of lung [facility])    Fatty liver    Fibromyalgia    GERD (gastroesophageal reflux disease)    Hyperlipidemia    Hypertension    Migraine    Myocardial infarction [facility])    Osteoarthritis    Osteoporosis    unsure, having a bone scan soon   Peripheral arterial disease (HCC)    left leg diagnosed by Dr Patty Sermons   Renal artery stenosis [facility])    a. 2008 s/p PTA   Past Surgical History:   Procedure Laterality Date   ABDOMINAL AORTAGRAM N/A 03/06/2014   Procedure: ABDOMINAL AORTAGRAM;  Surgeon: Larina Earthly, MD;  Location: [facility] CATH LAB;  Service: Cardiovascular;  Laterality: N/A;   ABDOMINAL AORTIC ANEURYSM REPAIR  2009   stenting   ABDOMINAL HYSTERECTOMY     Total, 1992   BREAST BIOPSY     CARDIAC CATHETERIZATION     CARDIOVASCULAR STRESS TEST  11/11/2009   normal study   CARPAL TUNNEL RELEASE  08/1993   left wrist   CARPAL TUNNEL RELEASE  01/1997   right   CATARACT EXTRACTION, BILATERAL  2021   CORONARY ARTERY BYPASS GRAFT  07/1994   Triple by Dr Ofilia Neas   CORONARY STENT INTERVENTION N/A 02/02/2021   Procedure: CORONARY STENT INTERVENTION;  Surgeon: Yvonne Kendall, MD;  Location: [facility] INVASIVE CV LAB;  Service: Cardiovascular;  Laterality: N/A;   CORONARY STENT INTERVENTION N/A 06/26/2021   Procedure: CORONARY STENT INTERVENTION;  Surgeon: Tonny Bollman, MD;  Location: [facility] INVASIVE CV LAB;  Service: Cardiovascular;  Laterality: N/A;   CORONARY ULTRASOUND/IVUS N/A 06/26/2021   Procedure: Intravascular Ultrasound/IVUS;  Surgeon: Tonny Bollman, MD;  Location: [facility] INVASIVE CV LAB;  Service: Cardiovascular;  Laterality: N/A;   CORONARY ULTRASOUND/IVUS N/A 09/03/2021   Procedure: Intravascular Ultrasound/IVUS;  Surgeon: Orbie Pyo, MD;  Location: [facility] INVASIVE CV LAB;  Service: Cardiovascular;  Laterality: N/A;   dental implants     EYE SURGERY  03/2020   cataract   HAND SURGERY Right 09/2002   Right hand pulley release   LAMINECTOMY WITH POSTERIOR LATERAL ARTHRODESIS LEVEL 1 N/A 03/24/2022   Procedure: LAMINECTOMY, FORAMINOTOMY, LUMBAR TWO- THREE ; INTERTRANSVERSE FUSION LUMBAR TWO - LUMBAR THREE RIGHT;  Surgeon: Tia Alert, MD;  Location: [facility] OR;  Service: Neurosurgery;  Laterality: N/A;   LEFT HEART CATH AND CORONARY ANGIOGRAPHY N/A 06/26/2021   Procedure: LEFT HEART CATH AND CORONARY ANGIOGRAPHY;  Surgeon: Tonny Bollman, MD;  Location: [facility] INVASIVE CV LAB;   Service: Cardiovascular;  Laterality: N/A;   LEFT HEART CATH AND CORS/GRAFTS ANGIOGRAPHY N/A 02/02/2021   Procedure: LEFT HEART CATH AND CORS/GRAFTS ANGIOGRAPHY;  Surgeon: Yvonne Kendall, MD;  Location: [facility] INVASIVE CV LAB;  Service: Cardiovascular;  Laterality: N/A;   LEFT HEART CATH AND CORS/GRAFTS ANGIOGRAPHY N/A 09/03/2021   Procedure: LEFT HEART CATH AND CORS/GRAFTS ANGIOGRAPHY;  Surgeon: Orbie Pyo, MD;  Location: [facility] INVASIVE CV LAB;  Service: Cardiovascular;  Laterality: N/A;   LEFT HEART CATH AND CORS/GRAFTS ANGIOGRAPHY N/A 07/15/2022   Procedure: LEFT HEART CATH AND CORS/GRAFTS ANGIOGRAPHY;  Surgeon: [place], Peter M, MD;  Location: [facility] INVASIVE CV LAB;  Service: Cardiovascular;  Laterality: N/A;   LEFT HEART CATHETERIZATION WITH CORONARY/GRAFT ANGIOGRAM N/A 06/11/2013   Procedure: LEFT HEART CATHETERIZATION WITH Isabel Caprice;  Surgeon: Vesta Mixer, MD;  Location: [facility] CATH LAB;  Service: Cardiovascular;  Laterality: N/A;   MULTIPLE TOOTH EXTRACTIONS  2000   All upper teeth.  Has full dneture.    RENAL ARTERY STENT  2008   SHOULDER ARTHROSCOPY WITH ROTATOR CUFF REPAIR Right 09/27/2018   Procedure: Right shoulder mini open rotator cuff repair;  Surgeon: Jene Every, MD;  Location: [facility] ORS;  Service: Orthopedics;  Laterality: Right;  90 mins   thyroid cyst apiration  05/1997 and 07/1998   TONSILECTOMY, ADENOIDECTOMY, BILATERAL MYRINGOTOMY AND TUBES  1989   TONSILLECTOMY     TOTAL ABDOMINAL HYSTERECTOMY  1992   Patient Active Problem List   Diagnosis Date Noted   Oxygen dependent 04/29/2023   Centrilobular emphysema (HCC) 02/17/2023   Chronic respiratory failure (HCC) 02/17/2023   Pneumonia 02/17/2023   Senile purpura (HCC) 12/22/2022   Daily consumption of alcohol 12/22/2022   S/P lumbar laminectomy 03/24/2022   Thyroid nodule greater than or equal to 1.5 cm in diameter incidentally noted on imaging study 08/27/2021   Angina pectoris (HCC) 06/25/2021   History of  compression fracture of spine - lumbar 06/19/2021   History of lumbosacral spine surgery 03/03/2021   NSTEMI (non-ST elevated myocardial infarction) (HCC) 2022 01/31/2021   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/26/2021   Neuropathic pain 12/05/2020   Gait instability 09/09/2020   Lumbar stenosis without neurogenic claudication 02/26/2020   COPD with chronic bronchitis (HCC) 09/28/2018   Essential (primary) hypertension 07/10/2018   IFG (impaired fasting glucose) 11/10/2017   DJD (degenerative joint disease), lumbosacral 11/10/2017   Fibromyalgia 11/10/2017   Dyshidrotic eczema 11/10/2017   GERD (gastroesophageal reflux disease) 11/10/2017   Osteopenia 11/10/2017   Does use hearing aid 11/10/2017   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   Fatty liver 11/10/2017   Coronary artery disease    Peripheral vascular disease with claudication (HCC)    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  History of abdominal aortic aneurysm repair 05/18/2011   History of renal artery stenosis 05/18/2011   Hyperlipidemia LDL goal <70     ONSET DATE: 12/14/2023  REFERRING DIAG: R53.1 (ICD-10-CM) - Weakness H55.00 (ICD-10-CM) - Nystagmus H81.4 (ICD-10-CM) - Central positional vertigo  THERAPY DIAG:  Muscle weakness (generalized)  Unsteadiness on feet  Other abnormalities of gait and mobility  Rationale for Evaluation and Treatment: Rehabilitation  SUBJECTIVE:                                                                                                                                                                                             SUBJECTIVE STATEMENT:  Pt denies any changes since land PT session yesterday;  states she is seeing the benefit of aquatic exercise, as she is realizing she is able to do exercises in the water that she would not be able to do on land  Pt accompanied by: self  PERTINENT HISTORY: PMH: AAA, COPD, CAD, fibromyalgia, HLD, HTN, hx of MI, PAD, lumbar spinal surgery  03/24/22, history of severe spinal stenosis at L4-5 status post decompression and fusion from L3-L5 levels. Now with worsening spinal stenosis at L2-3 level.   Per Dr. Marjory Lies: Gait difficulty likely related to hip flexor weakness associated with L2-3 spinal stenosis.   PAIN:  Are you having pain? Yes, in pain most of the time, has fibromyalgia, on oxycodone - 6/10 generalized pain; on oxycodone and aspirin  PRECAUTIONS: Fall   FALLS: Has patient fallen in last 6 months? No  LIVING ENVIRONMENT: Lives with: lives alone, husband in a nursing house  Lives in: House/apartment Stairs: 3rd floor apartment, can use an elevator  Has following equipment at home: Single point cane, Environmental consultant - 4 wheeled, and Grab bars  PLOF: Independent and Independent with community mobility with device  PATIENT GOALS: Wants to work on her strength, re-assess for BPPV, working on some balance    OBJECTIVE:  Note: Objective measures were completed at Evaluation unless otherwise noted.  DIAGNOSTIC FINDINGS: 02/20/23: IMPRESSION:    Unremarkable MRI brain (with and without).  No acute findings.    COGNITION: Overall cognitive status: Within functional limits for tasks assessed   SENSATION: Light touch: WFL  POSTURE: forward head   LOWER EXTREMITY MMT:    MMT Right Eval Left Eval  Hip flexion 3 3  Hip extension    Hip abduction 4+ 4+  Hip adduction 4+ 4+  Hip internal rotation    Hip external rotation    Knee flexion 4- 4  Knee extension 5 5  Ankle dorsiflexion    Ankle plantarflexion    Ankle  inversion    Ankle eversion    (Blank rows = not tested)  All tested in sitting    TRANSFERS: Assistive device utilized: None  Sit to stand: Modified independence Stand to sit: Modified independence    Performs with wide BOS   GAIT: Gait pattern: step through pattern Distance walked: Clinic distances  Assistive device utilized: Single point cane and None Level of assistance: Modified  independence and SBA Comments: No overt unsteadiness noted, but some path deviation noted with no AD    FUNCTIONAL TESTS:  5 times sit to stand: 17.3 seconds with no UE support  30 seconds chair stand test: 9 sit <> stands with no UE support, pt reporting 4-5/10 RPE (<10 below normal for age) Gait speed: 12.4 seconds with no AD = 2.65 ft/sec     M-CTSIB  Condition 1: Firm Surface, EO 30 Sec, Normal Sway  Condition 2: Firm Surface, EC 30 Sec, Normal Sway  Condition 3: Foam Surface, EO 30 Sec, Mild Sway  Condition 4: Foam Surface, EC 30 Sec, Mild Sway                                                                                          TREATMENT DATE: 02-29-24  Patient seen for aquatic therapy today.  Treatment took place in water 3.6-4.5 feet deep depending upon activity.  Pt entered and exited the pool via step negotiation with use of hand rails modified independently.    Aquatic therapy at [facility] - pool temp. 90 degrees   Patient seen for aquatic therapy today.  Treatment took place in water 3.6-4.0 feet deep depending upon activity.  Pt entered and exited the pool via step negotiation with use of bil. Hand rails using step by step sequence modified independently.    Pt performed water walking forwards, backwards and sideways - 18' x 4 reps each direction- no floatation device used    Stretching - Rt and Lt hamstring stretch - runner's stretch for RLE stretching;  Lt foot placed on pool wall for hamstring stretch as unable to achieve with runner's stretch position - 20 sec hold x 1 rep each  Marching in place 10 reps each leg - with contralateral UE flexion with 2 sec hold for balance; marching forwards across width of pool slowly with 1-2 sec hold on stance leg to facilitate improved SLS   Core stabilization exercise - pt performed stepping forward with barbells held in Ue's - pushing forward, then stepping back with pulling barbells back (scapular retraction), stepping  laterally with horizontal abdct./adduction with stepping back together 5 reps each direction; then performed stepping backward with pushing barbells forward (scapular protraction) 5 reps for core and LE strengthening    Squats bil. LE's 10 reps holding onto bar bells; RLE unilateral squats 10 reps:  LLE unilateral squats 10 reps - with increased bilateral UE support with unilateral squats   Pt performed standing hip flexion, abduction and extension 10 reps each leg with use of aquatic cuffs for increased resistance with eccentric contraction - pt reported these exercises were most challenging  Pt performed crossovers front 18' x 2 reps for improved balance with narrow BOS; stepping behind 18' x 2 reps; braiding 18' x 2 reps     Pt performed tap ups to aquatic step 10 reps each LE: progressed to diagonal tap ups 10 reps each LE for increased challenge with SLS balance      Pt requires buoyancy of water for support for joint unloading and for safety with balance exercises with reduced risk of fall compared to that on land;     current of water provides perturbations for challenges with standing balance Viscosity of water is needed for resistance for strengthening exercises        PATIENT EDUCATION: Education details: Continue HEP, next appt for aquatic therapy  Person educated: Patient Education method: Explanation Education comprehension: verbalized understanding  HOME EXERCISE PROGRAM: Access Code: [email or web address] URL: [email or web address] Date: 02/08/2024 Prepared by: Maebelle Munroe  Exercises - Seated Hamstring Curl with Anchored Resistance  - 1 x daily - 7 x weekly - 3 sets - 10 reps - Sitting Knee Extension with Resistance  - 1 x daily - 7 x weekly - 3 sets - 10 reps  In supine position; Bridging x 5 reps - attempted to perform with ball between knees but this caused a cramp in Lt hamstring so ball was removed Bridging with marching 5 reps Bridging with LE  extension 5 reps Bridging with hip abduction/adduction 5 reps  SLR RLE and LLE 10 reps - no weight  Hip abduction - RLE and LLE - 10 reps each leg Clam shell exercise 10 reps each leg  HEP: Feet Together (Compliant Surface) Head Motion - Eyes Closed    Stand on compliant surface: __foam pad______ with feet together. Close eyes and move head slowly, up and down. Repeat __1__ times per session. Do __1__ sessions per day.      issued [facility] HEP for bil. LE strengthening - pt performed the following exercises 10 reps each  Access Code: [email or web address] URL: [email or web address] Date: 02/01/2024 Prepared by: Maebelle Munroe  Exercises - Bridge  - 1 x daily - 7 x weekly - 1 sets - 10 reps - 5 sec hold hold - Supine Active Straight Leg Raise  - 1 x daily - 7 x weekly - 3 sets - 10 reps - Sidelying Hip Abduction  - 1 x daily - 7 x weekly - 3 sets - 10 reps - Sit to Stand  - 1 x daily - 7 x weekly - 1 sets - 10 reps  GOALS: Goals reviewed with patient? Yes  SHORT TERM GOALS: Target date: 02/15/2024  Pt will be independent with initial HEP for strength/balance in order to build upon functional gains made in therapy. Baseline: Goal status: MET  2.  Further vestibular assessment to be written with LTG updated Baseline: goal not needed Goal status: N/A  3.  FGA to be assessed with LTG written.  Baseline: score 21/30 on 02-02-24 Goal status: Goal met 02-02-24  4.  Pt will improve 5x sit<>stand to less than or equal to 15 sec to demonstrate improved functional strength and transfer efficiency.  Baseline: 17.3 seconds with no UE support   11.2 seconds with no UE support  Goal status: MET    LONG TERM GOALS: Target date: 03/07/2024  Pt will be independent with final HEP for strength/balance in order to build upon functional gains made in therapy. Baseline:  Goal status: INITIAL  2.  FGA goal to be written as appropriate. Increase FGA  score to >/= 26/30 to reduce fall  risk and increase safety with ambulation. Baseline: score 21/30 on 02-02-24 Goal status: INITIAL  3.  Vestibular goal to be written as appropriate.  Baseline: goal not needed Goal status: N/A  4.  Pt will improve 30 second chair stand to at least 11 sit <> stands in order to demo above norm for age.   Baseline: 9 sit <> stands with no UE support Goal status: INITIAL  5.  Pt will improve gait speed with no AD vs. LRAD to at least 2.9 ft/sec in order to demo improved community mobility.   Baseline: 12.4 seconds with no AD = 2.65 ft/sec  Goal status: INITIAL    ASSESSMENT:  CLINICAL IMPRESSION: Aquatic PT session focused on bil. LE and core strengthening, balance training and water walking in various directions.  Pt reported feeling muscles in low back after performing standing hip exercises with use of aquatic cuff for increased resistance - reported no true pain but "feeling it".  Pt reported feeling better after amb. 18' x 2 reps after these exercises.  Pt demonstrated improved balance in today's session compared to that in initial session as less UE support needed with standing balance activities.  Pt is progressing well towards LTG's.  Continue per POC.     OBJECTIVE IMPAIRMENTS: Abnormal gait, cardiopulmonary status limiting activity, decreased activity tolerance, decreased balance, decreased endurance, decreased mobility, difficulty walking, decreased strength, dizziness, postural dysfunction, and pain.   ACTIVITY LIMITATIONS: transfers and locomotion level  PARTICIPATION LIMITATIONS: community activity  PERSONAL FACTORS: Age, Behavior pattern, Past/current experiences, Time since onset of injury/illness/exacerbation, and 3+ comorbidities: AAA, COPD, CAD, fibromyalgia, HLD, HTN, hx of MI, PAD, lumbar spinal surgery 03/24/22, history of severe spinal stenosis at L4-5 status post decompression and fusion from L3-L5 levels. Now with worsening spinal stenosis at L2-3 level.   are also  affecting patient's functional outcome.   REHAB POTENTIAL: Good  CLINICAL DECISION MAKING: Evolving/moderate complexity  EVALUATION COMPLEXITY: Moderate  PLAN:  PT FREQUENCY: 2x/week  PT DURATION: 8 weeks - anticipate 6 weeks   PLANNED INTERVENTIONS: 97164- PT Re-evaluation, 97110-Therapeutic exercises, 97530- Therapeutic activity, 97112- Neuromuscular re-education, 97535- Self Care, 16109- Manual therapy, [phone]- Gait training, [phone]- Canalith repositioning, [phone]- Aquatic Therapy, Patient/Family education, Balance training, Stair training, Vestibular training, and DME instructions  PLAN FOR NEXT SESSION:   Check LTG's and renew for aquatic PT only (2x/week x 4 weeks)  (thanks, Chloe) -- continue strengthening and balance exercises   Aquatic -- add buoyant cuffs to LE ROM exercises for strengthening   Kary Kos, PT 02/29/2024, 7:00 PM

## 2024-03-01 ENCOUNTER — Encounter: Payer: Medicare Other | Admitting: Physical Therapy

## 2024-03-06 ENCOUNTER — Other Ambulatory Visit: Payer: Self-pay

## 2024-03-06 ENCOUNTER — Ambulatory Visit: Payer: Medicare Other | Attending: Diagnostic Neuroimaging | Admitting: Physical Therapy

## 2024-03-06 ENCOUNTER — Encounter: Payer: Self-pay | Admitting: Physical Therapy

## 2024-03-06 DIAGNOSIS — R2689 Other abnormalities of gait and mobility: Secondary | ICD-10-CM | POA: Insufficient documentation

## 2024-03-06 DIAGNOSIS — M6281 Muscle weakness (generalized): Secondary | ICD-10-CM | POA: Diagnosis not present

## 2024-03-06 DIAGNOSIS — R2681 Unsteadiness on feet: Secondary | ICD-10-CM | POA: Diagnosis not present

## 2024-03-06 MED ORDER — OMEPRAZOLE 40 MG PO CPDR: 40.0000 mg | DELAYED_RELEASE_CAPSULE | Freq: Every day | ORAL | 3 refills | Status: DC

## 2024-03-06 NOTE — Therapy (Signed)
 OUTPATIENT PHYSICAL THERAPY NEURO TREATMENT/RE-CERT   Patient Name: Megan [name] MRN: [id number] DOB:1946/04/15, 78 y.o., female Today's Date: 03/06/2024   PCP: Willow Ora, MD  REFERRING PROVIDER: Suanne Marker, MD  END OF SESSION:  PT End of Session - 03/06/24 0933     Visit Number 12    Number of Visits 20    Date for PT Re-Evaluation 05/05/24    Authorization Type [employer]    Authorization Time Period 01-24-24 - 03-20-24    Authorization - Visit Number 12    Authorization - Number of Visits 17    PT Start Time 0931    PT Stop Time 0956   full time not used due to D/C from land visit   PT Time Calculation (min) 25 min    Equipment Utilized During Treatment Gait belt    Activity Tolerance Patient tolerated treatment well    Behavior During Therapy [facility] for tasks assessed/performed                 Past Medical History:  Diagnosis Date   AAA (abdominal aortic aneurysm) (HCC)    a. s/p stent grafting in 2009.   Adenomatous colon polyp 05/2001   Adenomatous duodenal polyp    Allergy    Anginal pain (HCC)    COPD (chronic obstructive pulmonary disease) (HCC)    pt denies COPD   Coronary artery disease    a. s/p CABG in 1995;  b. 05/2013 Neg MV, EF 82%;  c. 06/2013 Cath: LM nl, LAD 40-50p, LCX nl, RCA 80p/159m, VG->RCA->PDA->PLSA min irregs, LIMA->LAD atretic, vigorous LV fxn.   Diabetes mellitus without complication (HCC)    history of, resolved. 2017   Diverticulosis    Eczema, dyshidrotic    Eczema, dyshidrotic 1986   Dr. Gerilyn Nestle   Emphysema of lung [facility])    Fatty liver    Fibromyalgia    GERD (gastroesophageal reflux disease)    Hyperlipidemia    Hypertension    Migraine    Myocardial infarction [facility])    Osteoarthritis    Osteoporosis    unsure, having a bone scan soon   Peripheral arterial disease (HCC)    left leg diagnosed by Dr Patty Sermons   Renal artery stenosis [facility])    a. 2008 s/p PTA   Past Surgical History:   Procedure Laterality Date   ABDOMINAL AORTAGRAM N/A 03/06/2014   Procedure: ABDOMINAL AORTAGRAM;  Surgeon: Larina Earthly, MD;  Location: [facility] CATH LAB;  Service: Cardiovascular;  Laterality: N/A;   ABDOMINAL AORTIC ANEURYSM REPAIR  2009   stenting   ABDOMINAL HYSTERECTOMY     Total, 1992   BREAST BIOPSY     CARDIAC CATHETERIZATION     CARDIOVASCULAR STRESS TEST  11/11/2009   normal study   CARPAL TUNNEL RELEASE  08/1993   left wrist   CARPAL TUNNEL RELEASE  01/1997   right   CATARACT EXTRACTION, BILATERAL  2021   CORONARY ARTERY BYPASS GRAFT  07/1994   Triple by Dr Ofilia Neas   CORONARY STENT INTERVENTION N/A 02/02/2021   Procedure: CORONARY STENT INTERVENTION;  Surgeon: Yvonne Kendall, MD;  Location: [facility] INVASIVE CV LAB;  Service: Cardiovascular;  Laterality: N/A;   CORONARY STENT INTERVENTION N/A 06/26/2021   Procedure: CORONARY STENT INTERVENTION;  Surgeon: Tonny Bollman, MD;  Location: [facility] INVASIVE CV LAB;  Service: Cardiovascular;  Laterality: N/A;   CORONARY ULTRASOUND/IVUS N/A 06/26/2021   Procedure: Intravascular Ultrasound/IVUS;  Surgeon: Tonny Bollman, MD;  Location:  [facility] INVASIVE CV LAB;  Service: Cardiovascular;  Laterality: N/A;   CORONARY ULTRASOUND/IVUS N/A 09/03/2021   Procedure: Intravascular Ultrasound/IVUS;  Surgeon: Orbie Pyo, MD;  Location: [facility] INVASIVE CV LAB;  Service: Cardiovascular;  Laterality: N/A;   dental implants     EYE SURGERY  03/2020   cataract   HAND SURGERY Right 09/2002   Right hand pulley release   LAMINECTOMY WITH POSTERIOR LATERAL ARTHRODESIS LEVEL 1 N/A 03/24/2022   Procedure: LAMINECTOMY, FORAMINOTOMY, LUMBAR TWO- THREE ; INTERTRANSVERSE FUSION LUMBAR TWO - LUMBAR THREE RIGHT;  Surgeon: Tia Alert, MD;  Location: [facility] OR;  Service: Neurosurgery;  Laterality: N/A;   LEFT HEART CATH AND CORONARY ANGIOGRAPHY N/A 06/26/2021   Procedure: LEFT HEART CATH AND CORONARY ANGIOGRAPHY;  Surgeon: Tonny Bollman, MD;  Location: [facility] INVASIVE CV LAB;   Service: Cardiovascular;  Laterality: N/A;   LEFT HEART CATH AND CORS/GRAFTS ANGIOGRAPHY N/A 02/02/2021   Procedure: LEFT HEART CATH AND CORS/GRAFTS ANGIOGRAPHY;  Surgeon: Yvonne Kendall, MD;  Location: [facility] INVASIVE CV LAB;  Service: Cardiovascular;  Laterality: N/A;   LEFT HEART CATH AND CORS/GRAFTS ANGIOGRAPHY N/A 09/03/2021   Procedure: LEFT HEART CATH AND CORS/GRAFTS ANGIOGRAPHY;  Surgeon: Orbie Pyo, MD;  Location: [facility] INVASIVE CV LAB;  Service: Cardiovascular;  Laterality: N/A;   LEFT HEART CATH AND CORS/GRAFTS ANGIOGRAPHY N/A 07/15/2022   Procedure: LEFT HEART CATH AND CORS/GRAFTS ANGIOGRAPHY;  Surgeon: [place], Peter M, MD;  Location: [facility] INVASIVE CV LAB;  Service: Cardiovascular;  Laterality: N/A;   LEFT HEART CATHETERIZATION WITH CORONARY/GRAFT ANGIOGRAM N/A 06/11/2013   Procedure: LEFT HEART CATHETERIZATION WITH Isabel Caprice;  Surgeon: Vesta Mixer, MD;  Location: [facility] CATH LAB;  Service: Cardiovascular;  Laterality: N/A;   MULTIPLE TOOTH EXTRACTIONS  2000   All upper teeth.  Has full dneture.    RENAL ARTERY STENT  2008   SHOULDER ARTHROSCOPY WITH ROTATOR CUFF REPAIR Right 09/27/2018   Procedure: Right shoulder mini open rotator cuff repair;  Surgeon: Jene Every, MD;  Location: [facility] ORS;  Service: Orthopedics;  Laterality: Right;  90 mins   thyroid cyst apiration  05/1997 and 07/1998   TONSILECTOMY, ADENOIDECTOMY, BILATERAL MYRINGOTOMY AND TUBES  1989   TONSILLECTOMY     TOTAL ABDOMINAL HYSTERECTOMY  1992   Patient Active Problem List   Diagnosis Date Noted   Oxygen dependent 04/29/2023   Centrilobular emphysema (HCC) 02/17/2023   Chronic respiratory failure (HCC) 02/17/2023   Pneumonia 02/17/2023   Senile purpura (HCC) 12/22/2022   Daily consumption of alcohol 12/22/2022   S/P lumbar laminectomy 03/24/2022   Thyroid nodule greater than or equal to 1.5 cm in diameter incidentally noted on imaging study 08/27/2021   Angina pectoris (HCC) 06/25/2021   History of  compression fracture of spine - lumbar 06/19/2021   History of lumbosacral spine surgery 03/03/2021   NSTEMI (non-ST elevated myocardial infarction) (HCC) 2022 01/31/2021   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/26/2021   Neuropathic pain 12/05/2020   Gait instability 09/09/2020   Lumbar stenosis without neurogenic claudication 02/26/2020   COPD with chronic bronchitis (HCC) 09/28/2018   Essential (primary) hypertension 07/10/2018   IFG (impaired fasting glucose) 11/10/2017   DJD (degenerative joint disease), lumbosacral 11/10/2017   Fibromyalgia 11/10/2017   Dyshidrotic eczema 11/10/2017   GERD (gastroesophageal reflux disease) 11/10/2017   Osteopenia 11/10/2017   Does use hearing aid 11/10/2017   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   Fatty liver 11/10/2017   Coronary artery disease    Peripheral vascular disease with claudication (HCC)  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   History of abdominal aortic aneurysm repair 05/18/2011   History of renal artery stenosis 05/18/2011   Hyperlipidemia LDL goal <70     ONSET DATE: 12/14/2023  REFERRING DIAG: R53.1 (ICD-10-CM) - Weakness H55.00 (ICD-10-CM) - Nystagmus H81.4 (ICD-10-CM) - Central positional vertigo  THERAPY DIAG:  Unsteadiness on feet  Other abnormalities of gait and mobility  Muscle weakness (generalized)  Rationale for Evaluation and Treatment: Rehabilitation  SUBJECTIVE:                                                                                                                                                                                             SUBJECTIVE STATEMENT:   No falls. Notes her balance has improved as well as her ability to get up from a chair. Ordered some ankle weights from [employer]  Pt accompanied by: self  PERTINENT HISTORY: PMH: AAA, COPD, CAD, fibromyalgia, HLD, HTN, hx of MI, PAD, lumbar spinal surgery 03/24/22, history of severe spinal stenosis at L4-5 status post decompression and fusion  from L3-L5 levels. Now with worsening spinal stenosis at L2-3 level.   Per Dr. Marjory Lies: Gait difficulty likely related to hip flexor weakness associated with L2-3 spinal stenosis.   PAIN:  Are you having pain? Yes, in pain most of the time, has fibromyalgia, on oxycodone - 6/10 generalized pain; on oxycodone and aspirin  PRECAUTIONS: Fall   FALLS: Has patient fallen in last 6 months? No  LIVING ENVIRONMENT: Lives with: lives alone, husband in a nursing house  Lives in: House/apartment Stairs: 3rd floor apartment, can use an elevator  Has following equipment at home: Single point cane, Environmental consultant - 4 wheeled, and Grab bars  PLOF: Independent and Independent with community mobility with device  PATIENT GOALS: Wants to work on her strength, re-assess for BPPV, working on some balance    OBJECTIVE:  Note: Objective measures were completed at Evaluation unless otherwise noted.  DIAGNOSTIC FINDINGS: 02/20/23: IMPRESSION:    Unremarkable MRI brain (with and without).  No acute findings.    COGNITION: Overall cognitive status: Within functional limits for tasks assessed   SENSATION: Light touch: WFL  POSTURE: forward head   LOWER EXTREMITY MMT:    MMT Right Eval Left Eval  Hip flexion 3 3  Hip extension    Hip abduction 4+ 4+  Hip adduction 4+ 4+  Hip internal rotation    Hip external rotation    Knee flexion 4- 4  Knee extension 5 5  Ankle dorsiflexion    Ankle plantarflexion    Ankle inversion    Ankle eversion    (Blank rows =  not tested)  All tested in sitting    TRANSFERS: Assistive device utilized: None  Sit to stand: Modified independence Stand to sit: Modified independence    Performs with wide BOS   GAIT: Gait pattern: step through pattern Distance walked: Clinic distances  Assistive device utilized: Single point cane and None Level of assistance: Modified independence and SBA Comments: No overt unsteadiness noted, but some path deviation noted  with no AD    FUNCTIONAL TESTS:  5 times sit to stand: 17.3 seconds with no UE support  30 seconds chair stand test: 9 sit <> stands with no UE support, pt reporting 4-5/10 RPE (<10 below normal for age) Gait speed: 12.4 seconds with no AD = 2.65 ft/sec     M-CTSIB  Condition 1: Firm Surface, EO 30 Sec, Normal Sway  Condition 2: Firm Surface, EC 30 Sec, Normal Sway  Condition 3: Foam Surface, EO 30 Sec, Mild Sway  Condition 4: Foam Surface, EC 30 Sec, Mild Sway                                                                                          TREATMENT DATE: 03/06/24  Therapeutic Activity:  30 second chair stand:  14 sit <> stands with no UE support  9.7 seconds = 3.38 ft/sec    [facility] PT Assessment - 03/06/24 0941       Functional Gait  Assessment   Gait assessed  Yes    Gait Level Surface Walks 20 ft in less than 7 sec but greater than 5.5 sec, uses assistive device, slower speed, mild gait deviations, or deviates 6-10 in outside of the 12 in walkway width.   6.2   Change in Gait Speed Able to smoothly change walking speed without loss of balance or gait deviation. Deviate no more than 6 in outside of the 12 in walkway width.    Gait with Horizontal Head Turns Performs head turns smoothly with no change in gait. Deviates no more than 6 in outside 12 in walkway width    Gait with Vertical Head Turns Performs head turns with no change in gait. Deviates no more than 6 in outside 12 in walkway width.    Gait and Pivot Turn Pivot turns safely within 3 sec and stops quickly with no loss of balance.    Step Over Obstacle Is able to step over 2 stacked shoe boxes taped together (9 in total height) without changing gait speed. No evidence of imbalance.    Gait with Narrow Base of Support Ambulates 4-7 steps.   4 steps   Gait with Eyes Closed Walks 20 ft, uses assistive device, slower speed, mild gait deviations, deviates 6-10 in outside 12 in walkway width. Ambulates 20 ft in less than  9 sec but greater than 7 sec.    Ambulating Backwards Walks 20 ft, no assistive devices, good speed, no evidence for imbalance, normal gait    Steps Alternating feet, must use rail.    Total Score 25    FGA comment: 25/30 = Low Fall Risk            Pt able  to perform floor transfer with mod I and using BUE support to get down on the floor and come back up in half kneel position with LLE forwards   PATIENT EDUCATION: Education details: Continue land HEP, D/C from land PT, but will continue with aquatics  Person educated: Patient Education method: Explanation Education comprehension: verbalized understanding  HOME EXERCISE PROGRAM: Access Code: [email or web address] URL: [email or web address] Date: 02/08/2024 Prepared by: Maebelle Munroe  Exercises - Seated Hamstring Curl with Anchored Resistance  - 1 x daily - 7 x weekly - 3 sets - 10 reps - Sitting Knee Extension with Resistance  - 1 x daily - 7 x weekly - 3 sets - 10 reps  In supine position; Bridging x 5 reps - attempted to perform with ball between knees but this caused a cramp in Lt hamstring so ball was removed Bridging with marching 5 reps Bridging with LE extension 5 reps Bridging with hip abduction/adduction 5 reps  SLR RLE and LLE 10 reps - no weight  Hip abduction - RLE and LLE - 10 reps each leg Clam shell exercise 10 reps each leg  HEP: Feet Together (Compliant Surface) Head Motion - Eyes Closed    Stand on compliant surface: __foam pad______ with feet together. Close eyes and move head slowly, up and down. Repeat __1__ times per session. Do __1__ sessions per day.      issued Medbridge HEP for bil. LE strengthening - pt performed the following exercises 10 reps each  Access Code: [email or web address] URL: [email or web address] Date: 02/01/2024 Prepared by: Maebelle Munroe  Exercises - Bridge  - 1 x daily - 7 x weekly - 1 sets - 10 reps - 5 sec hold hold - Supine Active Straight Leg Raise  - 1 x  daily - 7 x weekly - 3 sets - 10 reps - Sidelying Hip Abduction  - 1 x daily - 7 x weekly - 3 sets - 10 reps - Sit to Stand  - 1 x daily - 7 x weekly - 1 sets - 10 reps  GOALS: Goals reviewed with patient? Yes  SHORT TERM GOALS: Target date: 02/15/2024  Pt will be independent with initial HEP for strength/balance in order to build upon functional gains made in therapy. Baseline: Goal status: MET  2.  Further vestibular assessment to be written with LTG updated Baseline: goal not needed Goal status: N/A  3.  FGA to be assessed with LTG written.  Baseline: score 21/30 on 02-02-24 Goal status: Goal met 02-02-24  4.  Pt will improve 5x sit<>stand to less than or equal to 15 sec to demonstrate improved functional strength and transfer efficiency.  Baseline: 17.3 seconds with no UE support   11.2 seconds with no UE support  Goal status: MET    LONG TERM GOALS: Target date: 03/07/2024  Pt will be independent with final HEP for strength/balance in order to build upon functional gains made in therapy. Baseline:  Goal status: MET  2.  FGA goal to be written as appropriate. Increase FGA score to >/= 26/30 to reduce fall risk and increase safety with ambulation. Baseline: score 21/30 on 02-02-24  25/30 on 4/1 Goal status: PARTIALLY MET  3.  Vestibular goal to be written as appropriate.  Baseline: goal not needed Goal status: N/A  4.  Pt will improve 30 second chair stand to at least 11 sit <> stands in order to demo above norm for age.   Baseline: 9 sit <> stands with no UE support  14  sit <> stands with no UE support (4/1) Goal status: MET  5.  Pt will improve gait speed with no AD vs. LRAD to at least 2.9 ft/sec in order to demo improved community mobility.   Baseline: 12.4 seconds with no AD = 2.65 ft/sec   9.7 seconds = 3.38 ft/sec (4/1) Goal status: MET  UPDATED LTG FOR RE-CERT FOR AQUATIC THERAPY:  LONG TERM GOALS: Target date: 04/03/2024 Pt will be independent with  final aquatic HEP build upon functional gains made in therapy. Baseline:  Goal status: NEW   Pt will subjectively report a 50% improvement in pain since starting aquatic therapy.  Baseline:  Goal status: NEW   ASSESSMENT:  CLINICAL IMPRESSION: Today's skilled session focused on assessing LTGs for re-cert for pt to continue with aquatic therapy.  Pt has met LTGs #1, 4 and 5. Pt is consistent with HEP and has been performing at home. Pt significantly improved 30 second chair stand with no UE support from 9 > 14 sit <> stands, with pt above average for age. Pt also with significant improvement in gait speed from 2.65 ft/sec > 3.38 ft/sec with no AD, indicating improved community mobility. Pt improved FGA score to a 25/30, putting pt at a decr fall risk, but not quite to goal level. Pt will continue to benefit from skilled PT to include aquatic therapy to continue to address balance, strength, gait in order to improve functional mobility and decr pain.     OBJECTIVE IMPAIRMENTS: Abnormal gait, cardiopulmonary status limiting activity, decreased activity tolerance, decreased balance, decreased endurance, decreased mobility, difficulty walking, decreased strength, dizziness, postural dysfunction, and pain.   ACTIVITY LIMITATIONS: transfers and locomotion level  PARTICIPATION LIMITATIONS: community activity  PERSONAL FACTORS: Age, Behavior pattern, Past/current experiences, Time since onset of injury/illness/exacerbation, and 3+ comorbidities: AAA, COPD, CAD, fibromyalgia, HLD, HTN, hx of MI, PAD, lumbar spinal surgery 03/24/22, history of severe spinal stenosis at L4-5 status post decompression and fusion from L3-L5 levels. Now with worsening spinal stenosis at L2-3 level.   are also affecting patient's functional outcome.   REHAB POTENTIAL: Good  CLINICAL DECISION MAKING: Evolving/moderate complexity  EVALUATION COMPLEXITY: Moderate  PLAN:  PT FREQUENCY: 2x/week  PT DURATION: 8  weeks  PLANNED INTERVENTIONS: 97164- PT Re-evaluation, 97110-Therapeutic exercises, 97530- Therapeutic activity, [id number]- Neuromuscular re-education, 97535- Self Care, [id number]- Manual therapy, [phone]- Gait training, [phone]- Canalith repositioning, [phone]- Aquatic Therapy, Patient/Family education, Balance training, Stair training, Vestibular training, and DME instructions  PLAN FOR NEXT SESSION:    Aquatic -- add buoyant cuffs to LE ROM exercises for strengthening   Sherlie Ban, PT, DPT 03/06/24 10:20 AM

## 2024-03-06 NOTE — Telephone Encounter (Signed)
 Fax received for Omeprazole 40 mg daily.  Patient was last seen 12/26/23 and advised to continue omeprazole 40 mg daily.  Refill sent for 1 year.

## 2024-03-07 ENCOUNTER — Ambulatory Visit: Admitting: Physical Therapy

## 2024-03-07 ENCOUNTER — Encounter: Payer: Self-pay | Admitting: Physical Therapy

## 2024-03-07 DIAGNOSIS — M6281 Muscle weakness (generalized): Secondary | ICD-10-CM | POA: Diagnosis not present

## 2024-03-07 DIAGNOSIS — R2689 Other abnormalities of gait and mobility: Secondary | ICD-10-CM | POA: Diagnosis not present

## 2024-03-07 DIAGNOSIS — R2681 Unsteadiness on feet: Secondary | ICD-10-CM

## 2024-03-07 NOTE — Therapy (Signed)
 OUTPATIENT PHYSICAL THERAPY NEURO/AQUATIC TREATMENT NOTE   Patient Name: Megan [name] MRN: [id number] DOB:1946/02/10, 78 y.o., female Today's Date: 03/07/2024   PCP: Willow Ora, MD  REFERRING PROVIDER: Suanne Marker, MD  END OF SESSION:  PT End of Session - 03/07/24 1743     Visit Number 13    Number of Visits 20    Date for PT Re-Evaluation 05/05/24    Authorization Type [employer]    Authorization Time Period 01-24-24 - 03-20-24    Authorization - Visit Number 13    Authorization - Number of Visits 17    PT Start Time 1315    PT Stop Time 1400    PT Time Calculation (min) 45 min    Equipment Utilized During Treatment Other (comment)   barbells, aquatic cuffs   Activity Tolerance Patient tolerated treatment well    Behavior During Therapy WFL for tasks assessed/performed                 Past Medical History:  Diagnosis Date   AAA (abdominal aortic aneurysm) (HCC)    a. s/p stent grafting in 2009.   Adenomatous colon polyp 05/2001   Adenomatous duodenal polyp    Allergy    Anginal pain (HCC)    COPD (chronic obstructive pulmonary disease) (HCC)    pt denies COPD   Coronary artery disease    a. s/p CABG in 1995;  b. 05/2013 Neg MV, EF 82%;  c. 06/2013 Cath: LM nl, LAD 40-50p, LCX nl, RCA 80p/159m, VG->RCA->PDA->PLSA min irregs, LIMA->LAD atretic, vigorous LV fxn.   Diabetes mellitus without complication (HCC)    history of, resolved. 2017   Diverticulosis    Eczema, dyshidrotic    Eczema, dyshidrotic 1986   Dr. Gerilyn Nestle   Emphysema of lung [facility])    Fatty liver    Fibromyalgia    GERD (gastroesophageal reflux disease)    Hyperlipidemia    Hypertension    Migraine    Myocardial infarction [facility])    Osteoarthritis    Osteoporosis    unsure, having a bone scan soon   Peripheral arterial disease (HCC)    left leg diagnosed by Dr Patty Sermons   Renal artery stenosis [facility])    a. 2008 s/p PTA   Past Surgical History:  Procedure  Laterality Date   ABDOMINAL AORTAGRAM N/A 03/06/2014   Procedure: ABDOMINAL AORTAGRAM;  Surgeon: Larina Earthly, MD;  Location: [facility] CATH LAB;  Service: Cardiovascular;  Laterality: N/A;   ABDOMINAL AORTIC ANEURYSM REPAIR  2009   stenting   ABDOMINAL HYSTERECTOMY     Total, 1992   BREAST BIOPSY     CARDIAC CATHETERIZATION     CARDIOVASCULAR STRESS TEST  11/11/2009   normal study   CARPAL TUNNEL RELEASE  08/1993   left wrist   CARPAL TUNNEL RELEASE  01/1997   right   CATARACT EXTRACTION, BILATERAL  2021   CORONARY ARTERY BYPASS GRAFT  07/1994   Triple by Dr Ofilia Neas   CORONARY STENT INTERVENTION N/A 02/02/2021   Procedure: CORONARY STENT INTERVENTION;  Surgeon: Yvonne Kendall, MD;  Location: [facility] INVASIVE CV LAB;  Service: Cardiovascular;  Laterality: N/A;   CORONARY STENT INTERVENTION N/A 06/26/2021   Procedure: CORONARY STENT INTERVENTION;  Surgeon: Tonny Bollman, MD;  Location: [facility] INVASIVE CV LAB;  Service: Cardiovascular;  Laterality: N/A;   CORONARY ULTRASOUND/IVUS N/A 06/26/2021   Procedure: Intravascular Ultrasound/IVUS;  Surgeon: Tonny Bollman, MD;  Location: [facility] INVASIVE CV LAB;  Service:  Cardiovascular;  Laterality: N/A;   CORONARY ULTRASOUND/IVUS N/A 09/03/2021   Procedure: Intravascular Ultrasound/IVUS;  Surgeon: Orbie Pyo, MD;  Location: [facility] INVASIVE CV LAB;  Service: Cardiovascular;  Laterality: N/A;   dental implants     EYE SURGERY  03/2020   cataract   HAND SURGERY Right 09/2002   Right hand pulley release   LAMINECTOMY WITH POSTERIOR LATERAL ARTHRODESIS LEVEL 1 N/A 03/24/2022   Procedure: LAMINECTOMY, FORAMINOTOMY, LUMBAR TWO- THREE ; INTERTRANSVERSE FUSION LUMBAR TWO - LUMBAR THREE RIGHT;  Surgeon: Tia Alert, MD;  Location: [facility] OR;  Service: Neurosurgery;  Laterality: N/A;   LEFT HEART CATH AND CORONARY ANGIOGRAPHY N/A 06/26/2021   Procedure: LEFT HEART CATH AND CORONARY ANGIOGRAPHY;  Surgeon: Tonny Bollman, MD;  Location: [facility] INVASIVE CV LAB;  Service:  Cardiovascular;  Laterality: N/A;   LEFT HEART CATH AND CORS/GRAFTS ANGIOGRAPHY N/A 02/02/2021   Procedure: LEFT HEART CATH AND CORS/GRAFTS ANGIOGRAPHY;  Surgeon: Yvonne Kendall, MD;  Location: [facility] INVASIVE CV LAB;  Service: Cardiovascular;  Laterality: N/A;   LEFT HEART CATH AND CORS/GRAFTS ANGIOGRAPHY N/A 09/03/2021   Procedure: LEFT HEART CATH AND CORS/GRAFTS ANGIOGRAPHY;  Surgeon: Orbie Pyo, MD;  Location: [facility] INVASIVE CV LAB;  Service: Cardiovascular;  Laterality: N/A;   LEFT HEART CATH AND CORS/GRAFTS ANGIOGRAPHY N/A 07/15/2022   Procedure: LEFT HEART CATH AND CORS/GRAFTS ANGIOGRAPHY;  Surgeon: Swazil[place], Peter M, MD;  Location: [facility] INVASIVE CV LAB;  Service: Cardiovascular;  Laterality: N/A;   LEFT HEART CATHETERIZATION WITH CORONARY/GRAFT ANGIOGRAM N/A 06/11/2013   Procedure: LEFT HEART CATHETERIZATION WITH Isabel Caprice;  Surgeon: Vesta Mixer, MD;  Location: [facility] CATH LAB;  Service: Cardiovascular;  Laterality: N/A;   MULTIPLE TOOTH EXTRACTIONS  2000   All upper teeth.  Has full dneture.    RENAL ARTERY STENT  2008   SHOULDER ARTHROSCOPY WITH ROTATOR CUFF REPAIR Right 09/27/2018   Procedure: Right shoulder mini open rotator cuff repair;  Surgeon: Jene Every, MD;  Location: [facility] ORS;  Service: Orthopedics;  Laterality: Right;  90 mins   thyroid cyst apiration  05/1997 and 07/1998   TONSILECTOMY, ADENOIDECTOMY, BILATERAL MYRINGOTOMY AND TUBES  1989   TONSILLECTOMY     TOTAL ABDOMINAL HYSTERECTOMY  1992   Patient Active Problem List   Diagnosis Date Noted   Oxygen dependent 04/29/2023   Centrilobular emphysema (HCC) 02/17/2023   Chronic respiratory failure (HCC) 02/17/2023   Pneumonia 02/17/2023   Senile purpura (HCC) 12/22/2022   Daily consumption of alcohol 12/22/2022   S/P lumbar laminectomy 03/24/2022   Thyroid nodule greater than or equal to 1.5 cm in diameter incidentally noted on imaging study 08/27/2021   Angina pectoris (HCC) 06/25/2021   History of compression  fracture of spine - lumbar 06/19/2021   History of lumbosacral spine surgery 03/03/2021   NSTEMI (non-ST elevated myocardial infarction) (HCC) 2022 01/31/2021   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/26/2021   Neuropathic pain 12/05/2020   Gait instability 09/09/2020   Lumbar stenosis without neurogenic claudication 02/26/2020   COPD with chronic bronchitis (HCC) 09/28/2018   Essential (primary) hypertension 07/10/2018   IFG (impaired fasting glucose) 11/10/2017   DJD (degenerative joint disease), lumbosacral 11/10/2017   Fibromyalgia 11/10/2017   Dyshidrotic eczema 11/10/2017   GERD (gastroesophageal reflux disease) 11/10/2017   Osteopenia 11/10/2017   Does use hearing aid 11/10/2017   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   Fatty liver 11/10/2017   Coronary artery disease    Peripheral vascular disease with claudication (HCC)    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  History of abdominal aortic aneurysm repair 05/18/2011   History of renal artery stenosis 05/18/2011   Hyperlipidemia LDL goal <70     ONSET DATE: 12/14/2023  REFERRING DIAG: R53.1 (ICD-10-CM) - Weakness H55.00 (ICD-10-CM) - Nystagmus H81.4 (ICD-10-CM) - Central positional vertigo  THERAPY DIAG:  Unsteadiness on feet  Other abnormalities of gait and mobility  Muscle weakness (generalized)  Rationale for Evaluation and Treatment: Rehabilitation  SUBJECTIVE:                                                                                                                                                                                              SUBJECTIVE STATEMENT:  Pt finished up land PT yesterday - met all her  LTG's - wants to continue with aquatic PT to address back pain and to continue to improve balance and mobility; pt says she is doing very well since starting PT - has made good progress Pt accompanied by: self  PERTINENT HISTORY: PMH: AAA, COPD, CAD, fibromyalgia, HLD, HTN, hx of MI, PAD, lumbar spinal  surgery 03/24/22, history of severe spinal stenosis at L4-5 status post decompression and fusion from L3-L5 levels. Now with worsening spinal stenosis at L2-3 level.   Per Dr. Marjory Lies: Gait difficulty likely related to hip flexor weakness associated with L2-3 spinal stenosis.   PAIN:  Are you having pain? Yes, in pain most of the time, has fibromyalgia, on oxycodone - 6/10 generalized pain; on oxycodone and aspirin  PRECAUTIONS: Fall   FALLS: Has patient fallen in last 6 months? No  LIVING ENVIRONMENT: Lives with: lives alone, husband in a nursing house  Lives in: House/apartment Stairs: 3rd floor apartment, can use an elevator  Has following equipment at home: Single point cane, Environmental consultant - 4 wheeled, and Grab bars  PLOF: Independent and Independent with community mobility with device  PATIENT GOALS: Wants to work on her strength, re-assess for BPPV, working on some balance    OBJECTIVE:  Note: Objective measures were completed at Evaluation unless otherwise noted.  DIAGNOSTIC FINDINGS:  IMPRESSION:    Unremarkable MRI brain (with and without).  No acute findings.    COGNITION: Overall cognitive status: Within functional limits for tasks assessed   SENSATION: Light touch: WFL  POSTURE: forward head   LOWER EXTREMITY MMT:    MMT Right Eval Left Eval  Hip flexion 3 3  Hip extension    Hip abduction 4+ 4+  Hip adduction 4+ 4+  Hip internal rotation    Hip external rotation    Knee flexion 4- 4  Knee extension 5 5  Ankle dorsiflexion    Ankle plantarflexion  Ankle inversion    Ankle eversion    (Blank rows = not tested)  All tested in sitting    TRANSFERS: Assistive device utilized: None  Sit to stand: Modified independence Stand to sit: Modified independence    Performs with wide BOS   GAIT: Gait pattern: step through pattern Distance walked: Clinic distances  Assistive device utilized: Single point cane and None Level of assistance: Modified  independence and SBA Comments: No overt unsteadiness noted, but some path deviation noted with no AD    FUNCTIONAL TESTS:  5 times sit to stand: 17.3 seconds with no UE support  30 seconds chair stand test: 9 sit <> stands with no UE support, pt reporting 4-5/10 RPE (<10 below normal for age) Gait speed: 12.4 seconds with no AD = 2.65 ft/sec     M-CTSIB  Condition 1: Firm Surface, EO 30 Sec, Normal Sway  Condition 2: Firm Surface, EC 30 Sec, Normal Sway  Condition 3: Foam Surface, EO 30 Sec, Mild Sway  Condition 4: Foam Surface, EC 30 Sec, Mild Sway                                                                                          TREATMENT DATE: 03-07-24  Patient seen for aquatic therapy today.  Treatment took place in water 3.6-4.5 feet deep depending upon activity.  Pt entered and exited the pool via step negotiation with use of hand rails modified independently.    Aquatic therapy at [facility] - pool temp. 90 degrees   Patient seen for aquatic therapy today.  Treatment took place in water 3.6-4.0 feet deep depending upon activity.  Pt entered and exited the pool via step negotiation with use of bil. Hand rails using step by step sequence modified independently.    Pt performed water walking forwards, backwards and sideways - 18' x 4 reps each direction- no floatation device used    Stretching - Rt and Lt hamstring stretch - runner's stretch for RLE stretching;  Lt foot placed on pool wall for hamstring stretch as unable to achieve with runner's stretch position - 20 sec hold x 1 rep each  Marching in place 10 reps each leg - with contralateral UE flexion with 2 sec hold for balance; marching forwards across width of pool slowly with 1-2 sec hold on stance leg to facilitate improved SLS    Squats bil. LE's 10 reps holding onto bar bells; RLE unilateral squats 10 reps:  LLE unilateral squats 10 reps - holding onto pool edge for assist with balance   Pt performed standing hip  flexion, abduction and extension 10 reps each leg with use of aquatic cuffs for increased resistance with eccentric contraction  Marching with aquatic cuffs on LE's 10 reps each  Ai Chi posture - "Balancing" 10 reps each UE/LE unilaterally with UE support on pool edge for assist with balance; increased trunk ROM noted to increase with repetition of exercise             Pt performed tap ups to aquatic step 10 reps each LE: progressed to diagonal tap ups 10 reps each LE for increased  challenge with SLS - pt          performed all these tap up exercises without UE support on pool edge      Pt requires buoyancy of water for support for joint unloading and for safety with balance exercises with reduced risk of fall compared to that on land;     current of water provides perturbations for challenges with standing balance Viscosity of water is needed for resistance for strengthening exercises        PATIENT EDUCATION: Education details: Continue HEP, next appt for aquatic therapy  Person educated: Patient Education method: Explanation Education comprehension: verbalized understanding  HOME EXERCISE PROGRAM: Access Code: [email or web address] URL: [email or web address] Date: 02/08/2024 Prepared by: Maebelle Munroe  Exercises - Seated Hamstring Curl with Anchored Resistance  - 1 x daily - 7 x weekly - 3 sets - 10 reps - Sitting Knee Extension with Resistance  - 1 x daily - 7 x weekly - 3 sets - 10 reps  In supine position; Bridging x 5 reps - attempted to perform with ball between knees but this caused a cramp in Lt hamstring so ball was removed Bridging with marching 5 reps Bridging with LE extension 5 reps Bridging with hip abduction/adduction 5 reps  SLR RLE and LLE 10 reps - no weight  Hip abduction - RLE and LLE - 10 reps each leg Clam shell exercise 10 reps each leg  HEP: Feet Together (Compliant Surface) Head Motion - Eyes Closed    Stand on compliant surface: __foam  pad______ with feet together. Close eyes and move head slowly, up and down. Repeat __1__ times per session. Do __1__ sessions per day.      issued [facility] HEP for bil. LE strengthening - pt performed the following exercises 10 reps each  Access Code: [email or web address] URL: [email or web address] Date: 02/01/2024 Prepared by: Maebelle Munroe  Exercises - Bridge  - 1 x daily - 7 x weekly - 1 sets - 10 reps - 5 sec hold hold - Supine Active Straight Leg Raise  - 1 x daily - 7 x weekly - 3 sets - 10 reps - Sidelying Hip Abduction  - 1 x daily - 7 x weekly - 3 sets - 10 reps - Sit to Stand  - 1 x daily - 7 x weekly - 1 sets - 10 reps  GOALS: Goals reviewed with patient? Yes  SHORT TERM GOALS: Target date: 02/15/2024  Pt will be independent with initial HEP for strength/balance in order to build upon functional gains made in therapy. Baseline: Goal status: MET  2.  Further vestibular assessment to be written with LTG updated Baseline: goal not needed Goal status: N/A  3.  FGA to be assessed with LTG written.  Baseline: score 21/30 on 02-02-24 Goal status: Goal met 02-02-24  4.  Pt will improve 5x sit<>stand to less than or equal to 15 sec to demonstrate improved functional strength and transfer efficiency.  Baseline: 17.3 seconds with no UE support   11.2 seconds with no UE support  Goal status: MET    UPDATED LTG FOR RE-CERT FOR AQUATIC THERAPY:   LONG TERM GOALS: Target date: 04/03/2024 Pt will be independent with final aquatic HEP build upon functional gains made in therapy. Baseline:  Goal status: NEW   Pt will subjectively report a 50% improvement in pain since starting aquatic therapy.  Baseline:  Goal status: NEW   ASSESSMENT:  CLINICAL IMPRESSION: Aquatic PT session focused on bil. LE and core strengthening,  balance exercises to improve SLS on each leg and water walking in various directions.  Pt demonstrates improved balance with all exercises as less UE  support needed for assist with balance maintenance and recovery.  Pt stated marching in place felt good to her low back musc. - stated she felt muscles working but stated it was not painful.  Pt is progressing well.  Continue per POC.     OBJECTIVE IMPAIRMENTS: Abnormal gait, cardiopulmonary status limiting activity, decreased activity tolerance, decreased balance, decreased endurance, decreased mobility, difficulty walking, decreased strength, dizziness, postural dysfunction, and pain.   ACTIVITY LIMITATIONS: transfers and locomotion level  PARTICIPATION LIMITATIONS: community activity  PERSONAL FACTORS: Age, Behavior pattern, Past/current experiences, Time since onset of injury/illness/exacerbation, and 3+ comorbidities: AAA, COPD, CAD, fibromyalgia, HLD, HTN, hx of MI, PAD, lumbar spinal surgery 03/24/22, history of severe spinal stenosis at L4-5 status post decompression and fusion from L3-L5 levels. Now with worsening spinal stenosis at L2-3 level.   are also affecting patient's functional outcome.   REHAB POTENTIAL: Good  CLINICAL DECISION MAKING: Evolving/moderate complexity  EVALUATION COMPLEXITY: Moderate  PLAN:  PT FREQUENCY: 2x/week  PT DURATION: 4 weeks  PLANNED INTERVENTIONS: 97164- PT Re-evaluation, 97110-Therapeutic exercises, 97530- Therapeutic activity, 97112- Neuromuscular re-education, 97535- Self Care, 16109- Manual therapy, [phone]- Gait training, [phone]- Canalith repositioning, [phone]- Aquatic Therapy, Patient/Family education, Balance training, Stair training, Vestibular training, and DME instructions  PLAN FOR NEXT SESSION:   Cont 1-2x/week for 4 weeks aquatic PT; add Ai Chi posture      Murel Shenberger, Donavan Burnet, PT 03/07/2024, 5:44 PM

## 2024-03-08 ENCOUNTER — Encounter: Payer: Medicare Other | Admitting: Physical Therapy

## 2024-03-14 ENCOUNTER — Other Ambulatory Visit: Payer: Self-pay | Admitting: Cardiology

## 2024-03-14 ENCOUNTER — Ambulatory Visit: Payer: Self-pay | Admitting: Physical Therapy

## 2024-03-14 DIAGNOSIS — M6281 Muscle weakness (generalized): Secondary | ICD-10-CM

## 2024-03-14 DIAGNOSIS — R2681 Unsteadiness on feet: Secondary | ICD-10-CM | POA: Diagnosis not present

## 2024-03-14 DIAGNOSIS — R2689 Other abnormalities of gait and mobility: Secondary | ICD-10-CM | POA: Diagnosis not present

## 2024-03-15 ENCOUNTER — Encounter: Payer: Self-pay | Admitting: Physical Therapy

## 2024-03-15 NOTE — Therapy (Signed)
 OUTPATIENT PHYSICAL THERAPY NEURO/AQUATIC TREATMENT NOTE   Patient Name: Megan [name] MRN: [id number] DOB:Aug 29, 1946, 78 y.o., female Today's Date: 03/15/2024   PCP: Willow Ora, MD  REFERRING PROVIDER: Suanne Marker, MD  END OF SESSION:  PT End of Session - 03/15/24 2027     Visit Number 14    Number of Visits 20    Date for PT Re-Evaluation 05/05/24    Authorization Type [employer]    Authorization Time Period 01-24-24 - 03-20-24    Authorization - Visit Number 14    Authorization - Number of Visits 17    PT Start Time 1315    PT Stop Time 1400    PT Time Calculation (min) 45 min    Equipment Utilized During Treatment Other (comment)   barbells, aquatic cuffs   Activity Tolerance Patient tolerated treatment well    Behavior During Therapy WFL for tasks assessed/performed                 Past Medical History:  Diagnosis Date   AAA (abdominal aortic aneurysm) (HCC)    a. s/p stent grafting in 2009.   Adenomatous colon polyp 05/2001   Adenomatous duodenal polyp    Allergy    Anginal pain (HCC)    COPD (chronic obstructive pulmonary disease) (HCC)    pt denies COPD   Coronary artery disease    a. s/p CABG in 1995;  b. 05/2013 Neg MV, EF 82%;  c. 06/2013 Cath: LM nl, LAD 40-50p, LCX nl, RCA 80p/173m, VG->RCA->PDA->PLSA min irregs, LIMA->LAD atretic, vigorous LV fxn.   Diabetes mellitus without complication (HCC)    history of, resolved. 2017   Diverticulosis    Eczema, dyshidrotic    Eczema, dyshidrotic 1986   Dr. Gerilyn Nestle   Emphysema of lung [facility])    Fatty liver    Fibromyalgia    GERD (gastroesophageal reflux disease)    Hyperlipidemia    Hypertension    Migraine    Myocardial infarction [facility])    Osteoarthritis    Osteoporosis    unsure, having a bone scan soon   Peripheral arterial disease (HCC)    left leg diagnosed by Dr Patty Sermons   Renal artery stenosis [facility])    a. 2008 s/p PTA   Past Surgical History:  Procedure  Laterality Date   ABDOMINAL AORTAGRAM N/A 03/06/2014   Procedure: ABDOMINAL AORTAGRAM;  Surgeon: Larina Earthly, MD;  Location: [facility] CATH LAB;  Service: Cardiovascular;  Laterality: N/A;   ABDOMINAL AORTIC ANEURYSM REPAIR  2009   stenting   ABDOMINAL HYSTERECTOMY     Total, 1992   BREAST BIOPSY     CARDIAC CATHETERIZATION     CARDIOVASCULAR STRESS TEST  11/11/2009   normal study   CARPAL TUNNEL RELEASE  08/1993   left wrist   CARPAL TUNNEL RELEASE  01/1997   right   CATARACT EXTRACTION, BILATERAL  2021   CORONARY ARTERY BYPASS GRAFT  07/1994   Triple by Dr Ofilia Neas   CORONARY STENT INTERVENTION N/A 02/02/2021   Procedure: CORONARY STENT INTERVENTION;  Surgeon: Yvonne Kendall, MD;  Location: [facility] INVASIVE CV LAB;  Service: Cardiovascular;  Laterality: N/A;   CORONARY STENT INTERVENTION N/A 06/26/2021   Procedure: CORONARY STENT INTERVENTION;  Surgeon: Tonny Bollman, MD;  Location: [facility] INVASIVE CV LAB;  Service: Cardiovascular;  Laterality: N/A;   CORONARY ULTRASOUND/IVUS N/A 06/26/2021   Procedure: Intravascular Ultrasound/IVUS;  Surgeon: Tonny Bollman, MD;  Location: [facility] INVASIVE CV LAB;  Service:  Cardiovascular;  Laterality: N/A;   CORONARY ULTRASOUND/IVUS N/A 09/03/2021   Procedure: Intravascular Ultrasound/IVUS;  Surgeon: Orbie Pyo, MD;  Location: [facility] INVASIVE CV LAB;  Service: Cardiovascular;  Laterality: N/A;   dental implants     EYE SURGERY  03/2020   cataract   HAND SURGERY Right 09/2002   Right hand pulley release   LAMINECTOMY WITH POSTERIOR LATERAL ARTHRODESIS LEVEL 1 N/A 03/24/2022   Procedure: LAMINECTOMY, FORAMINOTOMY, LUMBAR TWO- THREE ; INTERTRANSVERSE FUSION LUMBAR TWO - LUMBAR THREE RIGHT;  Surgeon: Tia Alert, MD;  Location: [facility] OR;  Service: Neurosurgery;  Laterality: N/A;   LEFT HEART CATH AND CORONARY ANGIOGRAPHY N/A 06/26/2021   Procedure: LEFT HEART CATH AND CORONARY ANGIOGRAPHY;  Surgeon: Tonny Bollman, MD;  Location: [facility] INVASIVE CV LAB;  Service:  Cardiovascular;  Laterality: N/A;   LEFT HEART CATH AND CORS/GRAFTS ANGIOGRAPHY N/A 02/02/2021   Procedure: LEFT HEART CATH AND CORS/GRAFTS ANGIOGRAPHY;  Surgeon: Yvonne Kendall, MD;  Location: [facility] INVASIVE CV LAB;  Service: Cardiovascular;  Laterality: N/A;   LEFT HEART CATH AND CORS/GRAFTS ANGIOGRAPHY N/A 09/03/2021   Procedure: LEFT HEART CATH AND CORS/GRAFTS ANGIOGRAPHY;  Surgeon: Orbie Pyo, MD;  Location: [facility] INVASIVE CV LAB;  Service: Cardiovascular;  Laterality: N/A;   LEFT HEART CATH AND CORS/GRAFTS ANGIOGRAPHY N/A 07/15/2022   Procedure: LEFT HEART CATH AND CORS/GRAFTS ANGIOGRAPHY;  Surgeon: Swazil[place], Peter M, MD;  Location: [facility] INVASIVE CV LAB;  Service: Cardiovascular;  Laterality: N/A;   LEFT HEART CATHETERIZATION WITH CORONARY/GRAFT ANGIOGRAM N/A 06/11/2013   Procedure: LEFT HEART CATHETERIZATION WITH Isabel Caprice;  Surgeon: Vesta Mixer, MD;  Location: [facility] CATH LAB;  Service: Cardiovascular;  Laterality: N/A;   MULTIPLE TOOTH EXTRACTIONS  2000   All upper teeth.  Has full dneture.    RENAL ARTERY STENT  2008   SHOULDER ARTHROSCOPY WITH ROTATOR CUFF REPAIR Right 09/27/2018   Procedure: Right shoulder mini open rotator cuff repair;  Surgeon: Jene Every, MD;  Location: [facility] ORS;  Service: Orthopedics;  Laterality: Right;  90 mins   thyroid cyst apiration  05/1997 and 07/1998   TONSILECTOMY, ADENOIDECTOMY, BILATERAL MYRINGOTOMY AND TUBES  1989   TONSILLECTOMY     TOTAL ABDOMINAL HYSTERECTOMY  1992   Patient Active Problem List   Diagnosis Date Noted   Oxygen dependent 04/29/2023   Centrilobular emphysema (HCC) 02/17/2023   Chronic respiratory failure (HCC) 02/17/2023   Pneumonia 02/17/2023   Senile purpura (HCC) 12/22/2022   Daily consumption of alcohol 12/22/2022   S/P lumbar laminectomy 03/24/2022   Thyroid nodule greater than or equal to 1.5 cm in diameter incidentally noted on imaging study 08/27/2021   Angina pectoris (HCC) 06/25/2021   History of compression  fracture of spine - lumbar 06/19/2021   History of lumbosacral spine surgery 03/03/2021   NSTEMI (non-ST elevated myocardial infarction) (HCC) 2022 01/31/2021   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/26/2021   Neuropathic pain 12/05/2020   Gait instability 09/09/2020   Lumbar stenosis without neurogenic claudication 02/26/2020   COPD with chronic bronchitis (HCC) 09/28/2018   Essential (primary) hypertension 07/10/2018   IFG (impaired fasting glucose) 11/10/2017   DJD (degenerative joint disease), lumbosacral 11/10/2017   Fibromyalgia 11/10/2017   Dyshidrotic eczema 11/10/2017   GERD (gastroesophageal reflux disease) 11/10/2017   Osteopenia 11/10/2017   Does use hearing aid 11/10/2017   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   Fatty liver 11/10/2017   Coronary artery disease    Peripheral vascular disease with claudication (HCC)    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  History of abdominal aortic aneurysm repair 05/18/2011   History of renal artery stenosis 05/18/2011   Hyperlipidemia LDL goal <70     ONSET DATE: 12/14/2023  REFERRING DIAG: R53.1 (ICD-10-CM) - Weakness H55.00 (ICD-10-CM) - Nystagmus H81.4 (ICD-10-CM) - Central positional vertigo  THERAPY DIAG:  Unsteadiness on feet  Other abnormalities of gait and mobility  Muscle weakness (generalized)  Rationale for Evaluation and Treatment: Rehabilitation  SUBJECTIVE:                                                                                                                                                                                              SUBJECTIVE STATEMENT:  Pt finished up land PT yesterday - met all her  LTG's - wants to continue with aquatic PT to address back pain and to continue to improve balance and mobility; pt says she is doing very well since starting PT - has made good progress Pt accompanied by: self  PERTINENT HISTORY: PMH: AAA, COPD, CAD, fibromyalgia, HLD, HTN, hx of MI, PAD, lumbar spinal  surgery 03/24/22, history of severe spinal stenosis at L4-5 status post decompression and fusion from L3-L5 levels. Now with worsening spinal stenosis at L2-3 level.   Per Dr. Marjory Lies: Gait difficulty likely related to hip flexor weakness associated with L2-3 spinal stenosis.   PAIN:  Are you having pain? Yes, in pain most of the time, has fibromyalgia, on oxycodone - 6/10 generalized pain; on oxycodone and aspirin  PRECAUTIONS: Fall   FALLS: Has patient fallen in last 6 months? No  LIVING ENVIRONMENT: Lives with: lives alone, husband in a nursing house  Lives in: House/apartment Stairs: 3rd floor apartment, can use an elevator  Has following equipment at home: Single point cane, Environmental consultant - 4 wheeled, and Grab bars  PLOF: Independent and Independent with community mobility with device  PATIENT GOALS: Wants to work on her strength, re-assess for BPPV, working on some balance    OBJECTIVE:  Note: Objective measures were completed at Evaluation unless otherwise noted.  DIAGNOSTIC FINDINGS:  IMPRESSION:    Unremarkable MRI brain (with and without).  No acute findings.    COGNITION: Overall cognitive status: Within functional limits for tasks assessed   SENSATION: Light touch: WFL  POSTURE: forward head   LOWER EXTREMITY MMT:    MMT Right Eval Left Eval  Hip flexion 3 3  Hip extension    Hip abduction 4+ 4+  Hip adduction 4+ 4+  Hip internal rotation    Hip external rotation    Knee flexion 4- 4  Knee extension 5 5  Ankle dorsiflexion    Ankle plantarflexion  Ankle inversion    Ankle eversion    (Blank rows = not tested)  All tested in sitting    TRANSFERS: Assistive device utilized: None  Sit to stand: Modified independence Stand to sit: Modified independence    Performs with wide BOS   GAIT: Gait pattern: step through pattern Distance walked: Clinic distances  Assistive device utilized: Single point cane and None Level of assistance: Modified  independence and SBA Comments: No overt unsteadiness noted, but some path deviation noted with no AD    FUNCTIONAL TESTS:  5 times sit to stand: 17.3 seconds with no UE support  30 seconds chair stand test: 9 sit <> stands with no UE support, pt reporting 4-5/10 RPE (<10 below normal for age) Gait speed: 12.4 seconds with no AD = 2.65 ft/sec     M-CTSIB  Condition 1: Firm Surface, EO 30 Sec, Normal Sway  Condition 2: Firm Surface, EC 30 Sec, Normal Sway  Condition 3: Foam Surface, EO 30 Sec, Mild Sway  Condition 4: Foam Surface, EC 30 Sec, Mild Sway                                                                                          TREATMENT DATE: 03-07-24  Patient seen for aquatic therapy today.  Treatment took place in water 3.6-4.5 feet deep depending upon activity.  Pt entered and exited the pool via step negotiation with use of hand rails modified independently.    Aquatic therapy at [facility] - pool temp. 90 degrees   Patient seen for aquatic therapy today.  Treatment took place in water 3.6-4.0 feet deep depending upon activity.  Pt entered and exited the pool via step negotiation with use of bil. Hand rails using step by step sequence modified independently.    Pt performed water walking forwards, backwards and sideways - 18' x 4 reps each direction- no floatation device used    Stretching - Rt and Lt hamstring stretch - runner's stretch for RLE stretching;  Lt foot placed on pool wall for hamstring stretch as unable to achieve with runner's stretch position - 20 sec hold x 1 rep each  Marching in place 10 reps each leg - with contralateral UE flexion with 2 sec hold for balance; marching forwards across width of pool slowly with 1-2 sec hold on stance leg to facilitate improved SLS    Squats bil. LE's 10 reps holding onto bar bells; RLE unilateral squats 10 reps:  LLE unilateral squats 10 reps - holding onto pool edge for assist with balance   Pt performed standing hip  flexion, abduction and extension 10 reps each leg with use of aquatic cuffs for increased resistance with eccentric contraction  Marching with aquatic cuffs on LE's 10 reps each  Ai Chi posture - "Balancing" 10 reps each UE/LE unilaterally with UE support on pool edge for assist with balance; increased trunk ROM noted to increase with repetition of exercise             Pt performed tap ups to aquatic step 10 reps each LE: progressed to diagonal tap ups 10 reps each LE for increased  challenge with SLS - pt          performed all these tap up exercises without UE support on pool edge      Pt requires buoyancy of water for support for joint unloading and for safety with balance exercises with reduced risk of fall compared to that on land;     current of water provides perturbations for challenges with standing balance Viscosity of water is needed for resistance for strengthening exercises        PATIENT EDUCATION: Education details: Continue HEP, next appt for aquatic therapy  Person educated: Patient Education method: Explanation Education comprehension: verbalized understanding  HOME EXERCISE PROGRAM: Access Code: [email or web address] URL: [email or web address] Date: 02/08/2024 Prepared by: Maebelle Munroe  Exercises - Seated Hamstring Curl with Anchored Resistance  - 1 x daily - 7 x weekly - 3 sets - 10 reps - Sitting Knee Extension with Resistance  - 1 x daily - 7 x weekly - 3 sets - 10 reps  In supine position; Bridging x 5 reps - attempted to perform with ball between knees but this caused a cramp in Lt hamstring so ball was removed Bridging with marching 5 reps Bridging with LE extension 5 reps Bridging with hip abduction/adduction 5 reps  SLR RLE and LLE 10 reps - no weight  Hip abduction - RLE and LLE - 10 reps each leg Clam shell exercise 10 reps each leg  HEP: Feet Together (Compliant Surface) Head Motion - Eyes Closed    Stand on compliant surface: __foam  pad______ with feet together. Close eyes and move head slowly, up and down. Repeat __1__ times per session. Do __1__ sessions per day.      issued [facility] HEP for bil. LE strengthening - pt performed the following exercises 10 reps each  Access Code: [email or web address] URL: [email or web address] Date: 02/01/2024 Prepared by: Maebelle Munroe  Exercises - Bridge  - 1 x daily - 7 x weekly - 1 sets - 10 reps - 5 sec hold hold - Supine Active Straight Leg Raise  - 1 x daily - 7 x weekly - 3 sets - 10 reps - Sidelying Hip Abduction  - 1 x daily - 7 x weekly - 3 sets - 10 reps - Sit to Stand  - 1 x daily - 7 x weekly - 1 sets - 10 reps  GOALS: Goals reviewed with patient? Yes  SHORT TERM GOALS: Target date: 02/15/2024  Pt will be independent with initial HEP for strength/balance in order to build upon functional gains made in therapy. Baseline: Goal status: MET  2.  Further vestibular assessment to be written with LTG updated Baseline: goal not needed Goal status: N/A  3.  FGA to be assessed with LTG written.  Baseline: score 21/30 on 02-02-24 Goal status: Goal met 02-02-24  4.  Pt will improve 5x sit<>stand to less than or equal to 15 sec to demonstrate improved functional strength and transfer efficiency.  Baseline: 17.3 seconds with no UE support   11.2 seconds with no UE support  Goal status: MET    UPDATED LTG FOR RE-CERT FOR AQUATIC THERAPY:   LONG TERM GOALS: Target date: 04/03/2024 Pt will be independent with final aquatic HEP build upon functional gains made in therapy. Baseline:  Goal status: NEW   Pt will subjectively report a 50% improvement in pain since starting aquatic therapy.  Baseline:  Goal status: NEW   ASSESSMENT:  CLINICAL IMPRESSION: Aquatic PT session focused on bil. LE and core strengthening,  balance exercises to improve SLS on each leg and water walking in various directions.  Pt demonstrates improved balance with all exercises as less UE  support needed for assist with balance maintenance and recovery.  Pt stated marching in place felt good to her low back musc. - stated she felt muscles working but stated it was not painful.  Pt is progressing well.  Continue per POC.     OBJECTIVE IMPAIRMENTS: Abnormal gait, cardiopulmonary status limiting activity, decreased activity tolerance, decreased balance, decreased endurance, decreased mobility, difficulty walking, decreased strength, dizziness, postural dysfunction, and pain.   ACTIVITY LIMITATIONS: transfers and locomotion level  PARTICIPATION LIMITATIONS: community activity  PERSONAL FACTORS: Age, Behavior pattern, Past/current experiences, Time since onset of injury/illness/exacerbation, and 3+ comorbidities: AAA, COPD, CAD, fibromyalgia, HLD, HTN, hx of MI, PAD, lumbar spinal surgery 03/24/22, history of severe spinal stenosis at L4-5 status post decompression and fusion from L3-L5 levels. Now with worsening spinal stenosis at L2-3 level.   are also affecting patient's functional outcome.   REHAB POTENTIAL: Good  CLINICAL DECISION MAKING: Evolving/moderate complexity  EVALUATION COMPLEXITY: Moderate  PLAN:  PT FREQUENCY: 2x/week  PT DURATION: 4 weeks  PLANNED INTERVENTIONS: 97164- PT Re-evaluation, 97110-Therapeutic exercises, 97530- Therapeutic activity, 97112- Neuromuscular re-education, 97535- Self Care, 13086- Manual therapy, [phone]- Gait training, [phone]- Canalith repositioning, [phone]- Aquatic Therapy, Patient/Family education, Balance training, Stair training, Vestibular training, and DME instructions  PLAN FOR NEXT SESSION:   Cont 1-2x/week for 4 weeks aquatic PT; add Ai Chi posture      Jatasia Gundrum, Donavan Burnet, PT 03/15/2024, 8:29 PM

## 2024-03-16 DIAGNOSIS — Z961 Presence of intraocular lens: Secondary | ICD-10-CM | POA: Diagnosis not present

## 2024-03-16 DIAGNOSIS — H04123 Dry eye syndrome of bilateral lacrimal glands: Secondary | ICD-10-CM | POA: Diagnosis not present

## 2024-03-19 ENCOUNTER — Ambulatory Visit: Payer: Self-pay | Admitting: Physical Therapy

## 2024-03-19 DIAGNOSIS — M6281 Muscle weakness (generalized): Secondary | ICD-10-CM

## 2024-03-19 DIAGNOSIS — R2681 Unsteadiness on feet: Secondary | ICD-10-CM | POA: Diagnosis not present

## 2024-03-19 DIAGNOSIS — R2689 Other abnormalities of gait and mobility: Secondary | ICD-10-CM

## 2024-03-20 ENCOUNTER — Encounter: Payer: Self-pay | Admitting: Physical Therapy

## 2024-03-20 NOTE — Therapy (Signed)
 OUTPATIENT PHYSICAL THERAPY NEURO/AQUATIC TREATMENT NOTE   Patient Name: Megan [name] MRN: [id number] DOB:04-25-1946, 78 y.o., female Today's Date: 03/20/2024   PCP: Luevenia Saha, MD  REFERRING PROVIDER: Omega Bible, MD  END OF SESSION:  PT End of Session - 03/20/24 1100     Visit Number 15    Number of Visits 20    Date for PT Re-Evaluation 05/05/24    Authorization Type [employer]    Authorization Time Period 01-24-24 - 03-20-24    Authorization - Visit Number 15    Authorization - Number of Visits 17    PT Start Time 1450    PT Stop Time 1535    PT Time Calculation (min) 45 min    Equipment Utilized During Treatment Other (comment)   small noodle   Activity Tolerance Patient tolerated treatment well    Behavior During Therapy WFL for tasks assessed/performed                 Past Medical History:  Diagnosis Date   AAA (abdominal aortic aneurysm) (HCC)    a. s/p stent grafting in 2009.   Adenomatous colon polyp 05/2001   Adenomatous duodenal polyp    Allergy    Anginal pain (HCC)    COPD (chronic obstructive pulmonary disease) (HCC)    pt denies COPD   Coronary artery disease    a. s/p CABG in 1995;  b. 05/2013 Neg MV, EF 82%;  c. 06/2013 Cath: LM nl, LAD 40-50p, LCX nl, RCA 80p/151m, VG->RCA->PDA->PLSA min irregs, LIMA->LAD atretic, vigorous LV fxn.   Diabetes mellitus without complication (HCC)    history of, resolved. 2017   Diverticulosis    Eczema, dyshidrotic    Eczema, dyshidrotic 1986   Dr. Carmon Christen   Emphysema of lung [facility])    Fatty liver    Fibromyalgia    GERD (gastroesophageal reflux disease)    Hyperlipidemia    Hypertension    Migraine    Myocardial infarction [facility])    Osteoarthritis    Osteoporosis    unsure, having a bone scan soon   Peripheral arterial disease (HCC)    left leg diagnosed by Dr Santiago Cuff   Renal artery stenosis [facility])    a. 2008 s/p PTA   Past Surgical History:  Procedure Laterality  Date   ABDOMINAL AORTAGRAM N/A 03/06/2014   Procedure: ABDOMINAL AORTAGRAM;  Surgeon: Mayo Speck, MD;  Location: [facility] CATH LAB;  Service: Cardiovascular;  Laterality: N/A;   ABDOMINAL AORTIC ANEURYSM REPAIR  2009   stenting   ABDOMINAL HYSTERECTOMY     Total, 1992   BREAST BIOPSY     CARDIAC CATHETERIZATION     CARDIOVASCULAR STRESS TEST  11/11/2009   normal study   CARPAL TUNNEL RELEASE  08/1993   left wrist   CARPAL TUNNEL RELEASE  01/1997   right   CATARACT EXTRACTION, BILATERAL  2021   CORONARY ARTERY BYPASS GRAFT  07/1994   Triple by Dr Brinton Canavan   CORONARY STENT INTERVENTION N/A 02/02/2021   Procedure: CORONARY STENT INTERVENTION;  Surgeon: Sammy Crisp, MD;  Location: [facility] INVASIVE CV LAB;  Service: Cardiovascular;  Laterality: N/A;   CORONARY STENT INTERVENTION N/A 06/26/2021   Procedure: CORONARY STENT INTERVENTION;  Surgeon: Arnoldo Lapping, MD;  Location: [facility] INVASIVE CV LAB;  Service: Cardiovascular;  Laterality: N/A;   CORONARY ULTRASOUND/IVUS N/A 06/26/2021   Procedure: Intravascular Ultrasound/IVUS;  Surgeon: Arnoldo Lapping, MD;  Location: [facility] INVASIVE CV LAB;  Service: Cardiovascular;  Laterality: N/A;   CORONARY ULTRASOUND/IVUS N/A 09/03/2021   Procedure: Intravascular Ultrasound/IVUS;  Surgeon: Kyra Phy, MD;  Location: [facility] INVASIVE CV LAB;  Service: Cardiovascular;  Laterality: N/A;   dental implants     EYE SURGERY  03/2020   cataract   HAND SURGERY Right 09/2002   Right hand pulley release   LAMINECTOMY WITH POSTERIOR LATERAL ARTHRODESIS LEVEL 1 N/A 03/24/2022   Procedure: LAMINECTOMY, FORAMINOTOMY, LUMBAR TWO- THREE ; INTERTRANSVERSE FUSION LUMBAR TWO - LUMBAR THREE RIGHT;  Surgeon: Isadora Mar, MD;  Location: [facility] OR;  Service: Neurosurgery;  Laterality: N/A;   LEFT HEART CATH AND CORONARY ANGIOGRAPHY N/A 06/26/2021   Procedure: LEFT HEART CATH AND CORONARY ANGIOGRAPHY;  Surgeon: Arnoldo Lapping, MD;  Location: [facility] INVASIVE CV LAB;  Service: Cardiovascular;   Laterality: N/A;   LEFT HEART CATH AND CORS/GRAFTS ANGIOGRAPHY N/A 02/02/2021   Procedure: LEFT HEART CATH AND CORS/GRAFTS ANGIOGRAPHY;  Surgeon: Sammy Crisp, MD;  Location: [facility] INVASIVE CV LAB;  Service: Cardiovascular;  Laterality: N/A;   LEFT HEART CATH AND CORS/GRAFTS ANGIOGRAPHY N/A 09/03/2021   Procedure: LEFT HEART CATH AND CORS/GRAFTS ANGIOGRAPHY;  Surgeon: Kyra Phy, MD;  Location: [facility] INVASIVE CV LAB;  Service: Cardiovascular;  Laterality: N/A;   LEFT HEART CATH AND CORS/GRAFTS ANGIOGRAPHY N/A 07/15/2022   Procedure: LEFT HEART CATH AND CORS/GRAFTS ANGIOGRAPHY;  Surgeon: Swazil[place], Peter M, MD;  Location: [facility] INVASIVE CV LAB;  Service: Cardiovascular;  Laterality: N/A;   LEFT HEART CATHETERIZATION WITH CORONARY/GRAFT ANGIOGRAM N/A 06/11/2013   Procedure: LEFT HEART CATHETERIZATION WITH Estella Helling;  Surgeon: Lake Pilgrim, MD;  Location: [facility] CATH LAB;  Service: Cardiovascular;  Laterality: N/A;   MULTIPLE TOOTH EXTRACTIONS  2000   All upper teeth.  Has full dneture.    RENAL ARTERY STENT  2008   SHOULDER ARTHROSCOPY WITH ROTATOR CUFF REPAIR Right 09/27/2018   Procedure: Right shoulder mini open rotator cuff repair;  Surgeon: Orvan Blanch, MD;  Location: [facility] ORS;  Service: Orthopedics;  Laterality: Right;  90 mins   thyroid cyst apiration  05/1997 and 07/1998   TONSILECTOMY, ADENOIDECTOMY, BILATERAL MYRINGOTOMY AND TUBES  1989   TONSILLECTOMY     TOTAL ABDOMINAL HYSTERECTOMY  1992   Patient Active Problem List   Diagnosis Date Noted   Oxygen dependent 04/29/2023   Centrilobular emphysema (HCC) 02/17/2023   Chronic respiratory failure (HCC) 02/17/2023   Pneumonia 02/17/2023   Senile purpura (HCC) 12/22/2022   Daily consumption of alcohol 12/22/2022   S/P lumbar laminectomy 03/24/2022   Thyroid nodule greater than or equal to 1.5 cm in diameter incidentally noted on imaging study 08/27/2021   Angina pectoris (HCC) 06/25/2021   History of compression fracture of  spine - lumbar 06/19/2021   History of lumbosacral spine surgery 03/03/2021   NSTEMI (non-ST elevated myocardial infarction) (HCC) 2022 01/31/2021   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/26/2021   Neuropathic pain 12/05/2020   Gait instability 09/09/2020   Lumbar stenosis without neurogenic claudication 02/26/2020   COPD with chronic bronchitis (HCC) 09/28/2018   Essential (primary) hypertension 07/10/2018   IFG (impaired fasting glucose) 11/10/2017   DJD (degenerative joint disease), lumbosacral 11/10/2017   Fibromyalgia 11/10/2017   Dyshidrotic eczema 11/10/2017   GERD (gastroesophageal reflux disease) 11/10/2017   Osteopenia 11/10/2017   Does use hearing aid 11/10/2017   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   Fatty liver 11/10/2017   Coronary artery disease    Peripheral vascular disease with claudication (HCC)    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   History  of abdominal aortic aneurysm repair 05/18/2011   History of renal artery stenosis 05/18/2011   Hyperlipidemia LDL goal <70     ONSET DATE: 12/14/2023  REFERRING DIAG: R53.1 (ICD-10-CM) - Weakness H55.00 (ICD-10-CM) - Nystagmus H81.4 (ICD-10-CM) - Central positional vertigo  THERAPY DIAG:  Unsteadiness on feet  Other abnormalities of gait and mobility  Muscle weakness (generalized)  Rationale for Evaluation and Treatment: Rehabilitation  SUBJECTIVE:                                                                                                                                                                                              SUBJECTIVE STATEMENT:  Pt reports she had some increased back pain after aquatic session last Wed.- lasted for a few days but is feeling much better today Pt accompanied by: self  PERTINENT HISTORY: PMH: AAA, COPD, CAD, fibromyalgia, HLD, HTN, hx of MI, PAD, lumbar spinal surgery 03/24/22, history of severe spinal stenosis at L4-5 status post decompression and fusion from L3-L5 levels.  Now with worsening spinal stenosis at L2-3 level.   Per Dr. Salli Crawley: Gait difficulty likely related to hip flexor weakness associated with L2-3 spinal stenosis.   PAIN:  Are you having pain? Yes, in pain most of the time, has fibromyalgia, on oxycodone - 6/10 generalized pain; on oxycodone and aspirin  PRECAUTIONS: Fall   FALLS: Has patient fallen in last 6 months? No  LIVING ENVIRONMENT: Lives with: lives alone, husband in a nursing house  Lives in: House/apartment Stairs: 3rd floor apartment, can use an elevator  Has following equipment at home: Single point cane, Environmental consultant - 4 wheeled, and Grab bars  PLOF: Independent and Independent with community mobility with device  PATIENT GOALS: Wants to work on her strength, re-assess for BPPV, working on some balance    OBJECTIVE:  Note: Objective measures were completed at Evaluation unless otherwise noted.  DIAGNOSTIC FINDINGS:  IMPRESSION:    Unremarkable MRI brain (with and without).  No acute findings.    COGNITION: Overall cognitive status: Within functional limits for tasks assessed   SENSATION: Light touch: WFL  POSTURE: forward head   LOWER EXTREMITY MMT:    MMT Right Eval Left Eval  Hip flexion 3 3  Hip extension    Hip abduction 4+ 4+  Hip adduction 4+ 4+  Hip internal rotation    Hip external rotation    Knee flexion 4- 4  Knee extension 5 5  Ankle dorsiflexion    Ankle plantarflexion    Ankle inversion    Ankle eversion    (Blank rows = not tested)  All tested in sitting  TRANSFERS: Assistive device utilized: None  Sit to stand: Modified independence Stand to sit: Modified independence    Performs with wide BOS   GAIT: Gait pattern: step through pattern Distance walked: Clinic distances  Assistive device utilized: Single point cane and None Level of assistance: Modified independence and SBA Comments: No overt unsteadiness noted, but some path deviation noted with no AD     FUNCTIONAL TESTS:  5 times sit to stand: 17.3 seconds with no UE support  30 seconds chair stand test: 9 sit <> stands with no UE support, pt reporting 4-5/10 RPE (<10 below normal for age) Gait speed: 12.4 seconds with no AD = 2.65 ft/sec     M-CTSIB  Condition 1: Firm Surface, EO 30 Sec, Normal Sway  Condition 2: Firm Surface, EC 30 Sec, Normal Sway  Condition 3: Foam Surface, EO 30 Sec, Mild Sway  Condition 4: Foam Surface, EC 30 Sec, Mild Sway                                                                                          TREATMENT DATE: 03-19-24  Patient seen for aquatic therapy today.  Treatment took place in water 3.6-4.5 feet deep depending upon activity.  Pt entered and exited the pool via step negotiation with use of hand rails modified independently.    Aquatic therapy at [facility] - pool temp. 90 degrees   Patient seen for aquatic therapy today.  Treatment took place in water 3.6-4.0 feet deep depending upon activity.  Pt entered and exited the pool via step negotiation with use of bil. Hand rails using step by step sequence modified independently.    Pt performed water walking forwards, backwards and sideways - 18' x 4 reps each direction- no floatation device used    Stretching - Rt and Lt hamstring stretch - runner's stretch for RLE stretching;   20 sec hold x 1 rep each LE  Marching in place 10 reps each leg - with contralateral UE flexion with 2 sec hold for balance; marching forwards across width of pool slowly with 1-2 sec hold on stance leg to facilitate improved SLS - no barbells used   Squats bil. LE's 10 reps holding onto bar bells for floatation   Pt performed standing hip flexion, abduction and extension 10 reps each leg without increased resistance    Marching 10 reps - no resistance added due to c/o back pain occurring after previous aquatic PT session                Pt requires buoyancy of water for support for joint unloading and for  safety with balance exercises with reduced risk of fall compared to that on land;     current of water provides perturbations for challenges with standing balance Viscosity of water is needed for resistance for strengthening exercises        PATIENT EDUCATION: Education details: Continue HEP, next appt for aquatic therapy  Person educated: Patient Education method: Explanation Education comprehension: verbalized understanding  HOME EXERCISE PROGRAM: Access Code: [email or web address] URL: [email or web address] Date: 02/08/2024 Prepared by: Johnnette Nakayama  Exercises - Seated Hamstring Curl with  Anchored Resistance  - 1 x daily - 7 x weekly - 3 sets - 10 reps - Sitting Knee Extension with Resistance  - 1 x daily - 7 x weekly - 3 sets - 10 reps  In supine position; Bridging x 5 reps - attempted to perform with ball between knees but this caused a cramp in Lt hamstring so ball was removed Bridging with marching 5 reps Bridging with LE extension 5 reps Bridging with hip abduction/adduction 5 reps  SLR RLE and LLE 10 reps - no weight  Hip abduction - RLE and LLE - 10 reps each leg Clam shell exercise 10 reps each leg  HEP: Feet Together (Compliant Surface) Head Motion - Eyes Closed    Stand on compliant surface: __foam pad______ with feet together. Close eyes and move head slowly, up and down. Repeat __1__ times per session. Do __1__ sessions per day.      issued [facility] HEP for bil. LE strengthening - pt performed the following exercises 10 reps each  Access Code: [email or web address] URL: [email or web address] Date: 02/01/2024 Prepared by: Johnnette Nakayama  Exercises - Bridge  - 1 x daily - 7 x weekly - 1 sets - 10 reps - 5 sec hold hold - Supine Active Straight Leg Raise  - 1 x daily - 7 x weekly - 3 sets - 10 reps - Sidelying Hip Abduction  - 1 x daily - 7 x weekly - 3 sets - 10 reps - Sit to Stand  - 1 x daily - 7 x weekly - 1 sets - 10 reps  GOALS: Goals  reviewed with patient? Yes  SHORT TERM GOALS: Target date: 02/15/2024  Pt will be independent with initial HEP for strength/balance in order to build upon functional gains made in therapy. Baseline: Goal status: MET  2.  Further vestibular assessment to be written with LTG updated Baseline: goal not needed Goal status: N/A  3.  FGA to be assessed with LTG written.  Baseline: score 21/30 on 02-02-24 Goal status: Goal met 02-02-24  4.  Pt will improve 5x sit<>stand to less than or equal to 15 sec to demonstrate improved functional strength and transfer efficiency.  Baseline: 17.3 seconds with no UE support   11.2 seconds with no UE support  Goal status: MET    UPDATED LTG FOR RE-CERT FOR AQUATIC THERAPY:   LONG TERM GOALS: Target date: 04/03/2024 Pt will be independent with final aquatic HEP build upon functional gains made in therapy. Baseline:  Goal status: NEW   Pt will subjectively report a 50% improvement in pain since starting aquatic therapy.  Baseline:  Goal status: NEW   ASSESSMENT:  CLINICAL IMPRESSION: Aquatic PT session focused on bil. LE and core strengthening, balance exercises and water walking.  No increased resistance was used with LE exercises and barbells were not used with core stabilization exercises due to pt's report of increased back pain after previous aquatic PT session.  Pt reported no increased pain with any of aquatic exercises in today's session.   Continue per POC.     OBJECTIVE IMPAIRMENTS: Abnormal gait, cardiopulmonary status limiting activity, decreased activity tolerance, decreased balance, decreased endurance, decreased mobility, difficulty walking, decreased strength, dizziness, postural dysfunction, and pain.   ACTIVITY LIMITATIONS: transfers and locomotion level  PARTICIPATION LIMITATIONS: community activity  PERSONAL FACTORS: Age, Behavior pattern, Past/current experiences, Time since onset of injury/illness/exacerbation, and 3+  comorbidities: AAA, COPD, CAD, fibromyalgia, HLD, HTN, hx of MI, PAD, lumbar spinal surgery 03/24/22, history of severe  spinal stenosis at L4-5 status post decompression and fusion from L3-L5 levels. Now with worsening spinal stenosis at L2-3 level.   are also affecting patient's functional outcome.   REHAB POTENTIAL: Good  CLINICAL DECISION MAKING: Evolving/moderate complexity  EVALUATION COMPLEXITY: Moderate  PLAN:  PT FREQUENCY: 2x/week  PT DURATION: 4 weeks  PLANNED INTERVENTIONS: 97164- PT Re-evaluation, 97110-Therapeutic exercises, 97530- Therapeutic activity, 97112- Neuromuscular re-education, 97535- Self Care, 16109- Manual therapy, [phone]- Gait training, [phone]- Canalith repositioning, [phone]- Aquatic Therapy, Patient/Family education, Balance training, Stair training, Vestibular training, and DME instructions  PLAN FOR NEXT SESSION:   Cont 1-2x/week for 4 weeks aquatic PT; add Ai Chi posture      Devere Brem, Celeste Cola, PT 03/20/2024, 11:03 AM

## 2024-03-21 ENCOUNTER — Encounter: Payer: Self-pay | Admitting: Physical Therapy

## 2024-03-21 ENCOUNTER — Ambulatory Visit: Payer: Self-pay | Admitting: Physical Therapy

## 2024-03-21 DIAGNOSIS — M6281 Muscle weakness (generalized): Secondary | ICD-10-CM

## 2024-03-21 DIAGNOSIS — R2689 Other abnormalities of gait and mobility: Secondary | ICD-10-CM | POA: Diagnosis not present

## 2024-03-21 DIAGNOSIS — R2681 Unsteadiness on feet: Secondary | ICD-10-CM | POA: Diagnosis not present

## 2024-03-21 NOTE — Therapy (Signed)
 OUTPATIENT PHYSICAL THERAPY NEURO/AQUATIC TREATMENT NOTE   Patient Name: Megan [name] MRN: [id number] DOB:Feb 15, 1946, 78 y.o., female Today's Date: 03/21/2024   PCP: Luevenia Saha, MD  REFERRING PROVIDER: Omega Bible, MD  END OF SESSION:  PT End of Session - 03/21/24 1933     Visit Number 16   visit 1/4 additionally approved   Number of Visits 20    Date for PT Re-Evaluation 05/05/24    Authorization Type [place] MEDICARE    Authorization Time Period 01-24-24 - 03-20-24; 03-21-24 - 04-18-24    Authorization - Visit Number 16    Authorization - Number of Visits 19    PT Start Time 1310    PT Stop Time 1400    PT Time Calculation (min) 50 min    Equipment Utilized During Treatment Other (comment)   small noodle, small barbells   Activity Tolerance Patient tolerated treatment well    Behavior During Therapy WFL for tasks assessed/performed                 Past Medical History:  Diagnosis Date   AAA (abdominal aortic aneurysm) (HCC)    a. s/p stent grafting in 2009.   Adenomatous colon polyp 05/2001   Adenomatous duodenal polyp    Allergy    Anginal pain (HCC)    COPD (chronic obstructive pulmonary disease) (HCC)    pt denies COPD   Coronary artery disease    a. s/p CABG in 1995;  b. 05/2013 Neg MV, EF 82%;  c. 06/2013 Cath: LM nl, LAD 40-50p, LCX nl, RCA 80p/148m, VG->RCA->PDA->PLSA min irregs, LIMA->LAD atretic, vigorous LV fxn.   Diabetes mellitus without complication (HCC)    history of, resolved. 2017   Diverticulosis    Eczema, dyshidrotic    Eczema, dyshidrotic 1986   Dr. Carmon Christen   Emphysema of lung [facility])    Fatty liver    Fibromyalgia    GERD (gastroesophageal reflux disease)    Hyperlipidemia    Hypertension    Migraine    Myocardial infarction [facility])    Osteoarthritis    Osteoporosis    unsure, having a bone scan soon   Peripheral arterial disease (HCC)    left leg diagnosed by Dr Santiago Cuff   Renal artery stenosis [facility])     a. 2008 s/p PTA   Past Surgical History:  Procedure Laterality Date   ABDOMINAL AORTAGRAM N/A 03/06/2014   Procedure: ABDOMINAL AORTAGRAM;  Surgeon: Mayo Speck, MD;  Location: [facility] CATH LAB;  Service: Cardiovascular;  Laterality: N/A;   ABDOMINAL AORTIC ANEURYSM REPAIR  2009   stenting   ABDOMINAL HYSTERECTOMY     Total, 1992   BREAST BIOPSY     CARDIAC CATHETERIZATION     CARDIOVASCULAR STRESS TEST  11/11/2009   normal study   CARPAL TUNNEL RELEASE  08/1993   left wrist   CARPAL TUNNEL RELEASE  01/1997   right   CATARACT EXTRACTION, BILATERAL  2021   CORONARY ARTERY BYPASS GRAFT  07/1994   Triple by Dr Brinton Canavan   CORONARY STENT INTERVENTION N/A 02/02/2021   Procedure: CORONARY STENT INTERVENTION;  Surgeon: Sammy Crisp, MD;  Location: [facility] INVASIVE CV LAB;  Service: Cardiovascular;  Laterality: N/A;   CORONARY STENT INTERVENTION N/A 06/26/2021   Procedure: CORONARY STENT INTERVENTION;  Surgeon: Arnoldo Lapping, MD;  Location: [facility] INVASIVE CV LAB;  Service: Cardiovascular;  Laterality: N/A;   CORONARY ULTRASOUND/IVUS N/A 06/26/2021   Procedure: Intravascular Ultrasound/IVUS;  Surgeon: Arnoldo Lapping,  MD;  Location: [facility] INVASIVE CV LAB;  Service: Cardiovascular;  Laterality: N/A;   CORONARY ULTRASOUND/IVUS N/A 09/03/2021   Procedure: Intravascular Ultrasound/IVUS;  Surgeon: Kyra Phy, MD;  Location: [facility] INVASIVE CV LAB;  Service: Cardiovascular;  Laterality: N/A;   dental implants     EYE SURGERY  03/2020   cataract   HAND SURGERY Right 09/2002   Right hand pulley release   LAMINECTOMY WITH POSTERIOR LATERAL ARTHRODESIS LEVEL 1 N/A 03/24/2022   Procedure: LAMINECTOMY, FORAMINOTOMY, LUMBAR TWO- THREE ; INTERTRANSVERSE FUSION LUMBAR TWO - LUMBAR THREE RIGHT;  Surgeon: Isadora Mar, MD;  Location: [facility] OR;  Service: Neurosurgery;  Laterality: N/A;   LEFT HEART CATH AND CORONARY ANGIOGRAPHY N/A 06/26/2021   Procedure: LEFT HEART CATH AND CORONARY ANGIOGRAPHY;  Surgeon: Arnoldo Lapping,  MD;  Location: [facility] INVASIVE CV LAB;  Service: Cardiovascular;  Laterality: N/A;   LEFT HEART CATH AND CORS/GRAFTS ANGIOGRAPHY N/A 02/02/2021   Procedure: LEFT HEART CATH AND CORS/GRAFTS ANGIOGRAPHY;  Surgeon: Sammy Crisp, MD;  Location: [facility] INVASIVE CV LAB;  Service: Cardiovascular;  Laterality: N/A;   LEFT HEART CATH AND CORS/GRAFTS ANGIOGRAPHY N/A 09/03/2021   Procedure: LEFT HEART CATH AND CORS/GRAFTS ANGIOGRAPHY;  Surgeon: Kyra Phy, MD;  Location: [facility] INVASIVE CV LAB;  Service: Cardiovascular;  Laterality: N/A;   LEFT HEART CATH AND CORS/GRAFTS ANGIOGRAPHY N/A 07/15/2022   Procedure: LEFT HEART CATH AND CORS/GRAFTS ANGIOGRAPHY;  Surgeon: Swazil[place], Peter M, MD;  Location: [facility] INVASIVE CV LAB;  Service: Cardiovascular;  Laterality: N/A;   LEFT HEART CATHETERIZATION WITH CORONARY/GRAFT ANGIOGRAM N/A 06/11/2013   Procedure: LEFT HEART CATHETERIZATION WITH Estella Helling;  Surgeon: Lake Pilgrim, MD;  Location: [facility] CATH LAB;  Service: Cardiovascular;  Laterality: N/A;   MULTIPLE TOOTH EXTRACTIONS  2000   All upper teeth.  Has full dneture.    RENAL ARTERY STENT  2008   SHOULDER ARTHROSCOPY WITH ROTATOR CUFF REPAIR Right 09/27/2018   Procedure: Right shoulder mini open rotator cuff repair;  Surgeon: Orvan Blanch, MD;  Location: [facility] ORS;  Service: Orthopedics;  Laterality: Right;  90 mins   thyroid cyst apiration  05/1997 and 07/1998   TONSILECTOMY, ADENOIDECTOMY, BILATERAL MYRINGOTOMY AND TUBES  1989   TONSILLECTOMY     TOTAL ABDOMINAL HYSTERECTOMY  1992   Patient Active Problem List   Diagnosis Date Noted   Oxygen dependent 04/29/2023   Centrilobular emphysema (HCC) 02/17/2023   Chronic respiratory failure (HCC) 02/17/2023   Pneumonia 02/17/2023   Senile purpura (HCC) 12/22/2022   Daily consumption of alcohol 12/22/2022   S/P lumbar laminectomy 03/24/2022   Thyroid nodule greater than or equal to 1.5 cm in diameter incidentally noted on imaging study 08/27/2021   Angina  pectoris (HCC) 06/25/2021   History of compression fracture of spine - lumbar 06/19/2021   History of lumbosacral spine surgery 03/03/2021   NSTEMI (non-ST elevated myocardial infarction) (HCC) 2022 01/31/2021   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/26/2021   Neuropathic pain 12/05/2020   Gait instability 09/09/2020   Lumbar stenosis without neurogenic claudication 02/26/2020   COPD with chronic bronchitis (HCC) 09/28/2018   Essential (primary) hypertension 07/10/2018   IFG (impaired fasting glucose) 11/10/2017   DJD (degenerative joint disease), lumbosacral 11/10/2017   Fibromyalgia 11/10/2017   Dyshidrotic eczema 11/10/2017   GERD (gastroesophageal reflux disease) 11/10/2017   Osteopenia 11/10/2017   Does use hearing aid 11/10/2017   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   Fatty liver 11/10/2017   Coronary artery disease    Peripheral vascular disease with  claudication (HCC)    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   History of abdominal aortic aneurysm repair 05/18/2011   History of renal artery stenosis 05/18/2011   Hyperlipidemia LDL goal <70     ONSET DATE: 12/14/2023  REFERRING DIAG: R53.1 (ICD-10-CM) - Weakness H55.00 (ICD-10-CM) - Nystagmus H81.4 (ICD-10-CM) - Central positional vertigo  THERAPY DIAG:  Unsteadiness on feet  Other abnormalities of gait and mobility  Muscle weakness (generalized)  Rationale for Evaluation and Treatment: Rehabilitation  SUBJECTIVE:                                                                                                                                                                                              SUBJECTIVE STATEMENT:  Pt reports she has not had as much back pain since Monday's pool session (in which no increased resistance was used) like she had after pool session last Wed. - states "it was much better" Pt accompanied by: self  PERTINENT HISTORY: PMH: AAA, COPD, CAD, fibromyalgia, HLD, HTN, hx of MI, PAD, lumbar  spinal surgery 03/24/22, history of severe spinal stenosis at L4-5 status post decompression and fusion from L3-L5 levels. Now with worsening spinal stenosis at L2-3 level.   Per Dr. Marjory Lies: Gait difficulty likely related to hip flexor weakness associated with L2-3 spinal stenosis.   PAIN:  Are you having pain? Yes, in pain most of the time, has fibromyalgia, on oxycodone - 6/10 generalized pain; on oxycodone and aspirin  PRECAUTIONS: Fall   FALLS: Has patient fallen in last 6 months? No  LIVING ENVIRONMENT: Lives with: lives alone, husband in a nursing house  Lives in: House/apartment Stairs: 3rd floor apartment, can use an elevator  Has following equipment at home: Single point cane, Environmental consultant - 4 wheeled, and Grab bars  PLOF: Independent and Independent with community mobility with device  PATIENT GOALS: Wants to work on her strength, re-assess for BPPV, working on some balance    OBJECTIVE:  Note: Objective measures were completed at Evaluation unless otherwise noted.  DIAGNOSTIC FINDINGS:  IMPRESSION:    Unremarkable MRI brain (with and without).  No acute findings.    COGNITION: Overall cognitive status: Within functional limits for tasks assessed   SENSATION: Light touch: WFL  POSTURE: forward head   LOWER EXTREMITY MMT:    MMT Right Eval Left Eval  Hip flexion 3 3  Hip extension    Hip abduction 4+ 4+  Hip adduction 4+ 4+  Hip internal rotation    Hip external rotation    Knee flexion 4- 4  Knee extension 5 5  Ankle dorsiflexion    Ankle plantarflexion  Ankle inversion    Ankle eversion    (Blank rows = not tested)  All tested in sitting    TRANSFERS: Assistive device utilized: None  Sit to stand: Modified independence Stand to sit: Modified independence    Performs with wide BOS   GAIT: Gait pattern: step through pattern Distance walked: Clinic distances  Assistive device utilized: Single point cane and None Level of assistance:  Modified independence and SBA Comments: No overt unsteadiness noted, but some path deviation noted with no AD    FUNCTIONAL TESTS:  5 times sit to stand: 17.3 seconds with no UE support  30 seconds chair stand test: 9 sit <> stands with no UE support, pt reporting 4-5/10 RPE (<10 below normal for age) Gait speed: 12.4 seconds with no AD = 2.65 ft/sec     M-CTSIB  Condition 1: Firm Surface, EO 30 Sec, Normal Sway  Condition 2: Firm Surface, EC 30 Sec, Normal Sway  Condition 3: Foam Surface, EO 30 Sec, Mild Sway  Condition 4: Foam Surface, EC 30 Sec, Mild Sway                                                                                          TREATMENT DATE: 03-21-24  Patient seen for aquatic therapy today.  Treatment took place in water 3.6-4.5 feet deep depending upon activity.  Pt entered and exited the pool via step negotiation with use of hand rails modified independently.    Aquatic therapy at [facility] - pool temp. 90 degrees   Patient seen for aquatic therapy today.  Treatment took place in water 3.6-4.0 feet deep depending upon activity.  Pt entered and exited the pool via step negotiation with use of bil. Hand rails using step by step sequence modified independently.    Pt performed water walking forwards, backwards and sideways - 18' x 4 reps each direction- no floatation device used    Stretching - Rt and Lt hamstring stretch - runner's stretch for RLE stretching;   20 sec hold x 1 rep each LE  Marching in place 10 reps each leg - with contralateral UE flexion with 2 sec hold for balance; marching forwards across width of pool slowly with 1-2 sec hold on stance leg to facilitate improved SLS - no barbells used   Squats bil. LE's 10 reps holding onto bar bells for floatation for assist with balance   Pt performed standing hip flexion, abduction and extension 10 reps each leg without increased resistance other than buoyancy  Pt performed SLS stance activity - stood on  each leg - made circles CW 10 reps, then CCW 10 reps with other leg - performed with RLE and LLE  Pt stood with feet together for increased challenge with balance - performed shoulder flexion/extension with each hand fisted for increased resistance 10 reps for core stabilization and strengthening; performed horizontal shoulder abdct./adduction with hands fisted with Ue's submerged slightly under water - performed in 3.8' water depth   Sidestepping with squats 18' x 4 reps across width of pool for LE and core strengthening  Ai Chi posture "Enclosing" - 10 reps for cool down  at end of session               Pt requires buoyancy of water for support for joint unloading and for safety with balance exercises with reduced risk of fall compared to that on land;     current of water provides perturbations for challenges with standing balance Viscosity of water is needed for resistance for strengthening exercises        PATIENT EDUCATION: Education details: Continue HEP, next appt for aquatic therapy  Person educated: Patient Education method: Explanation Education comprehension: verbalized understanding  HOME EXERCISE PROGRAM: Access Code: [email or web address] URL: [email or web address] Date: 02/08/2024 Prepared by: Johnnette Nakayama  Exercises - Seated Hamstring Curl with Anchored Resistance  - 1 x daily - 7 x weekly - 3 sets - 10 reps - Sitting Knee Extension with Resistance  - 1 x daily - 7 x weekly - 3 sets - 10 reps  In supine position; Bridging x 5 reps - attempted to perform with ball between knees but this caused a cramp in Lt hamstring so ball was removed Bridging with marching 5 reps Bridging with LE extension 5 reps Bridging with hip abduction/adduction 5 reps  SLR RLE and LLE 10 reps - no weight  Hip abduction - RLE and LLE - 10 reps each leg Clam shell exercise 10 reps each leg  HEP: Feet Together (Compliant Surface) Head Motion - Eyes Closed    Stand on compliant  surface: __foam pad______ with feet together. Close eyes and move head slowly, up and down. Repeat __1__ times per session. Do __1__ sessions per day.      issued [facility] HEP for bil. LE strengthening - pt performed the following exercises 10 reps each  Access Code: [email or web address] URL: [email or web address] Date: 02/01/2024 Prepared by: Johnnette Nakayama  Exercises - Bridge  - 1 x daily - 7 x weekly - 1 sets - 10 reps - 5 sec hold hold - Supine Active Straight Leg Raise  - 1 x daily - 7 x weekly - 3 sets - 10 reps - Sidelying Hip Abduction  - 1 x daily - 7 x weekly - 3 sets - 10 reps - Sit to Stand  - 1 x daily - 7 x weekly - 1 sets - 10 reps  GOALS: Goals reviewed with patient? Yes  SHORT TERM GOALS: Target date: 02/15/2024  Pt will be independent with initial HEP for strength/balance in order to build upon functional gains made in therapy. Baseline: Goal status: MET  2.  Further vestibular assessment to be written with LTG updated Baseline: goal not needed Goal status: N/A  3.  FGA to be assessed with LTG written.  Baseline: score 21/30 on 02-02-24 Goal status: Goal met 02-02-24  4.  Pt will improve 5x sit<>stand to less than or equal to 15 sec to demonstrate improved functional strength and transfer efficiency.  Baseline: 17.3 seconds with no UE support   11.2 seconds with no UE support  Goal status: MET    UPDATED LTG FOR RE-CERT FOR AQUATIC THERAPY:   LONG TERM GOALS: Target date: 04/03/2024 Pt will be independent with final aquatic HEP build upon functional gains made in therapy. Baseline:  Goal status: NEW   Pt will subjectively report a 50% improvement in pain since starting aquatic therapy.  Baseline:  Goal status: NEW   ASSESSMENT:  CLINICAL IMPRESSION: Aquatic PT session focused on core strengthening, balance training and water walking in various directions for mobility, balance and pain management.  No  increased resistance was used for LE  strengthening/ROM or for core strengthening due to pt's report of increased back pain and groin discomfort after aquatic PT session last Wed. (03-14-24).  Pt reported improvement in pain after session on Monday this week (03-19-24) in which no increased resistance was used.  Continue per POC.     OBJECTIVE IMPAIRMENTS: Abnormal gait, cardiopulmonary status limiting activity, decreased activity tolerance, decreased balance, decreased endurance, decreased mobility, difficulty walking, decreased strength, dizziness, postural dysfunction, and pain.   ACTIVITY LIMITATIONS: transfers and locomotion level  PARTICIPATION LIMITATIONS: community activity  PERSONAL FACTORS: Age, Behavior pattern, Past/current experiences, Time since onset of injury/illness/exacerbation, and 3+ comorbidities: AAA, COPD, CAD, fibromyalgia, HLD, HTN, hx of MI, PAD, lumbar spinal surgery 03/24/22, history of severe spinal stenosis at L4-5 status post decompression and fusion from L3-L5 levels. Now with worsening spinal stenosis at L2-3 level.   are also affecting patient's functional outcome.   REHAB POTENTIAL: Good  CLINICAL DECISION MAKING: Evolving/moderate complexity  EVALUATION COMPLEXITY: Moderate  PLAN:  PT FREQUENCY: 2x/week  PT DURATION: 4 weeks  PLANNED INTERVENTIONS: 97164- PT Re-evaluation, 97110-Therapeutic exercises, 97530- Therapeutic activity, 97112- Neuromuscular re-education, 97535- Self Care, 16109- Manual therapy, [phone]- Gait training, [phone]- Canalith repositioning, [phone]- Aquatic Therapy, Patient/Family education, Balance training, Stair training, Vestibular training, and DME instructions  PLAN FOR NEXT SESSION:   Cont 1-2x/week for 4 weeks aquatic PT; add Ai Chi posture      Draylon Mercadel, Celeste Cola, PT 03/21/2024, 7:36 PM

## 2024-03-26 ENCOUNTER — Ambulatory Visit: Payer: Self-pay | Admitting: Physical Therapy

## 2024-03-26 DIAGNOSIS — M6281 Muscle weakness (generalized): Secondary | ICD-10-CM | POA: Diagnosis not present

## 2024-03-26 DIAGNOSIS — R2681 Unsteadiness on feet: Secondary | ICD-10-CM | POA: Diagnosis not present

## 2024-03-26 DIAGNOSIS — R2689 Other abnormalities of gait and mobility: Secondary | ICD-10-CM | POA: Diagnosis not present

## 2024-03-27 ENCOUNTER — Encounter: Payer: Self-pay | Admitting: Physical Therapy

## 2024-03-27 NOTE — Therapy (Signed)
 OUTPATIENT PHYSICAL THERAPY NEURO/AQUATIC TREATMENT NOTE   Patient Name: SYEDA PRICKETT MRN: [id number] DOB:[birth year], 78 y.o., female Today's Date: 03/27/2024   PCP: Luevenia Saha, MD  REFERRING PROVIDER: Omega Bible, MD  END OF SESSION:  PT End of Session - 03/27/24 2159     Visit Number 17    Number of Visits 20    Date for PT Re-Evaluation 05/05/24    Authorization Type [employer] [place] MEDIC[employer]    Authorization Time Period 01-24-24 - 03-20-24; 03-21-24 - 04-18-24    Authorization - Visit Number 17    Authorization - Number of Visits 19    PT Start Time 1445    PT Stop Time 1535    PT Time Calculation (min) 50 min    Equipment Utilized During Treatment Other (comment)   small noodle, small barbells   Activity Tolerance Patient tolerated treatment well    Behavior During Therapy WFL for tasks assessed/performed                 Past Medical History:  Diagnosis Date   AAA (abdominal aortic aneurysm) (HCC)    a. s/p stent grafting in 2009.   Adenomatous colon polyp 05/2001   Adenomatous duodenal polyp    Allergy    Anginal pain (HCC)    COPD (chronic obstructive pulmonary disease) (HCC)    pt denies COPD   Coronary artery disease    a. s/p CABG in 1995;  b. 05/2013 Neg MV, EF 82%;  c. 06/2013 Cath: LM nl, LAD 40-50p, LCX nl, RCA 80p/136m, VG->RCA->PDA->PLSA min irregs, LIMA->LAD atretic, vigorous LV fxn.   Diabetes mellitus without complication (HCC)    history of, resolved. 2017   Diverticulosis    Eczema, dyshidrotic    Eczema, dyshidrotic 1986   Dr. Carmon Christen   Emphysema of lung [facility])    Fatty liver    Fibromyalgia    GERD (gastroesophageal reflux disease)    Hyperlipidemia    Hypertension    Migraine    Myocardial infarction [facility])    Osteoarthritis    Osteoporosis    unsure, having a bone scan soon   Peripheral arterial disease (HCC)    left leg diagnosed by Dr Santiago Cuff   Renal artery stenosis [facility])    a. 2008 s/p PTA   Past  Surgical History:  Procedure Laterality Date   ABDOMINAL AORTAGRAM N/A 03/06/2014   Procedure: ABDOMINAL AORTAGRAM;  Surgeon: Mayo Speck, MD;  Location: [facility] CATH LAB;  Service: Cardiovascular;  Laterality: N/A;   ABDOMINAL AORTIC ANEURYSM REPAIR  2009   stenting   ABDOMINAL HYSTERECTOMY     Total, 1992   BREAST BIOPSY     CARDIAC CATHETERIZATION     CARDIOVASCULAR STRESS TEST  11/11/2009   normal study   CARPAL TUNNEL RELEASE  08/1993   left wrist   CARPAL TUNNEL RELEASE  01/1997   right   CATARACT EXTRACTION, BILATERAL  2021   CORONARY ARTERY BYPASS GRAFT  07/1994   Triple by Dr Brinton Canavan   CORONARY STENT INTERVENTION N/A 02/02/2021   Procedure: CORONARY STENT INTERVENTION;  Surgeon: Sammy Crisp, MD;  Location: [facility] INVASIVE CV LAB;  Service: Cardiovascular;  Laterality: N/A;   CORONARY STENT INTERVENTION N/A 06/26/2021   Procedure: CORONARY STENT INTERVENTION;  Surgeon: Arnoldo Lapping, MD;  Location: [facility] INVASIVE CV LAB;  Service: Cardiovascular;  Laterality: N/A;   CORONARY ULTRASOUND/IVUS N/A 06/26/2021   Procedure: Intravascular Ultrasound/IVUS;  Surgeon: Arnoldo Lapping, MD;  Location: [facility] INVASIVE  CV LAB;  Service: Cardiovascular;  Laterality: N/A;   CORONARY ULTRASOUND/IVUS N/A 09/03/2021   Procedure: Intravascular Ultrasound/IVUS;  Surgeon: Kyra Phy, MD;  Location: [facility] INVASIVE CV LAB;  Service: Cardiovascular;  Laterality: N/A;   dental implants     EYE SURGERY  03/2020   cataract   HAND SURGERY Right 09/2002   Right hand pulley release   LAMINECTOMY WITH POSTERIOR LATERAL ARTHRODESIS LEVEL 1 N/A 03/24/2022   Procedure: LAMINECTOMY, FORAMINOTOMY, LUMBAR TWO- THREE ; INTERTRANSVERSE FUSION LUMBAR TWO - LUMBAR THREE RIGHT;  Surgeon: Isadora Mar, MD;  Location: [facility] OR;  Service: Neurosurgery;  Laterality: N/A;   LEFT HEART CATH AND CORONARY ANGIOGRAPHY N/A 06/26/2021   Procedure: LEFT HEART CATH AND CORONARY ANGIOGRAPHY;  Surgeon: Arnoldo Lapping, MD;  Location: [facility]  INVASIVE CV LAB;  Service: Cardiovascular;  Laterality: N/A;   LEFT HEART CATH AND CORS/GRAFTS ANGIOGRAPHY N/A 02/02/2021   Procedure: LEFT HEART CATH AND CORS/GRAFTS ANGIOGRAPHY;  Surgeon: Sammy Crisp, MD;  Location: [facility] INVASIVE CV LAB;  Service: Cardiovascular;  Laterality: N/A;   LEFT HEART CATH AND CORS/GRAFTS ANGIOGRAPHY N/A 09/03/2021   Procedure: LEFT HEART CATH AND CORS/GRAFTS ANGIOGRAPHY;  Surgeon: Kyra Phy, MD;  Location: [facility] INVASIVE CV LAB;  Service: Cardiovascular;  Laterality: N/A;   LEFT HEART CATH AND CORS/GRAFTS ANGIOGRAPHY N/A 07/15/2022   Procedure: LEFT HEART CATH AND CORS/GRAFTS ANGIOGRAPHY;  Surgeon: [place], Peter M, MD;  Location: [facility] INVASIVE CV LAB;  Service: Cardiovascular;  Laterality: N/A;   LEFT HEART CATHETERIZATION WITH CORONARY/GRAFT ANGIOGRAM N/A 06/11/2013   Procedure: LEFT HEART CATHETERIZATION WITH Estella Helling;  Surgeon: Lake Pilgrim, MD;  Location: [facility] CATH LAB;  Service: Cardiovascular;  Laterality: N/A;   MULTIPLE TOOTH EXTRACTIONS  2000   All upper teeth.  Has full dneture.    RENAL ARTERY STENT  2008   SHOULDER ARTHROSCOPY WITH ROTATOR CUFF REPAIR Right 09/27/2018   Procedure: Right shoulder mini open rotator cuff repair;  Surgeon: Orvan Blanch, MD;  Location: [facility] ORS;  Service: Orthopedics;  Laterality: Right;  90 mins   thyroid  cyst apiration  05/1997 and 07/1998   TONSILECTOMY, ADENOIDECTOMY, BILATERAL MYRINGOTOMY AND TUBES  1989   TONSILLECTOMY     TOTAL ABDOMINAL HYSTERECTOMY  1992   Patient Active Problem List   Diagnosis Date Noted   Oxygen  dependent 04/29/2023   Centrilobular emphysema (HCC) 02/17/2023   Chronic respiratory failure (HCC) 02/17/2023   Pneumonia 02/17/2023   Senile purpura (HCC) 12/22/2022   Daily consumption of alcohol 12/22/2022   S/P lumbar laminectomy 03/24/2022   Thyroid  nodule greater than or equal to 1.5 cm in diameter incidentally noted on imaging study 08/27/2021   Angina pectoris (HCC)  06/25/2021   History of compression fracture of spine - lumbar 06/19/2021   History of lumbosacral spine surgery 03/03/2021   NSTEMI (non-ST elevated myocardial infarction) (HCC) 2022 01/31/2021   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/26/2021   Neuropathic pain 12/05/2020   Gait instability 09/09/2020   Lumbar stenosis without neurogenic claudication 02/26/2020   COPD with chronic bronchitis (HCC) 09/28/2018   Essential (primary) hypertension 07/10/2018   IFG (impaired fasting glucose) 11/10/2017   DJD (degenerative joint disease), lumbosacral 11/10/2017   Fibromyalgia 11/10/2017   Dyshidrotic eczema 11/10/2017   GERD (gastroesophageal reflux disease) 11/10/2017   Osteopenia 11/10/2017   Does use hearing aid 11/10/2017   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   Fatty liver 11/10/2017   Coronary artery disease    Peripheral vascular disease with claudication (HCC)  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   History of abdominal aortic aneurysm repair 05/18/2011   History of renal artery stenosis 05/18/2011   Hyperlipidemia LDL goal <70     ONSET DATE: 12/14/2023  REFERRING DIAG: R53.1 (ICD-10-CM) - Weakness H55.00 (ICD-10-CM) - Nystagmus H81.4 (ICD-10-CM) - Central positional vertigo  THERAPY DIAG:  Unsteadiness on feet  Other abnormalities of gait and mobility  Muscle weakness (generalized)  Rationale for Evaluation and Treatment: Rehabilitation  SUBJECTIVE:                                                                                                                                                                                              SUBJECTIVE STATEMENT:  Pt reports no changes or problems - continues to do well Pt accompanied by: self  PERTINENT HISTORY: PMH: AAA, COPD, CAD, fibromyalgia, HLD, HTN, hx of MI, PAD, lumbar spinal surgery 03/24/22, history of severe spinal stenosis at L4-5 status post decompression and fusion from L3-L5 levels. Now with worsening spinal  stenosis at L2-3 level.   Per Dr. Salli Crawley: Gait difficulty likely related to hip flexor weakness associated with L2-3 spinal stenosis.   PAIN:  Are you having pain? Yes, in pain most of the time, has fibromyalgia, on oxycodone  - 6/10 generalized pain; on oxycodone  and aspirin   PRECAUTIONS: Fall   FALLS: Has patient fallen in last 6 months? No  LIVING ENVIRONMENT: Lives with: lives alone, husband in a nursing house  Lives in: House/apartment Stairs: 3rd floor apartment, can use an elevator  Has following equipment at home: Single point cane, Environmental consultant - 4 wheeled, and Grab bars  PLOF: Independent and Independent with community mobility with device  PATIENT GOALS: Wants to work on her strength, re-assess for BPPV, working on some balance    OBJECTIVE:  Note: Objective measures were completed at Evaluation unless otherwise noted.  DIAGNOSTIC FINDINGS:  IMPRESSION:    Unremarkable MRI brain (with and without).  No acute findings.    COGNITION: Overall cognitive status: Within functional limits for tasks assessed   SENSATION: Light touch: WFL  POSTURE: forward head   LOWER EXTREMITY MMT:    MMT Right Eval Left Eval  Hip flexion 3 3  Hip extension    Hip abduction 4+ 4+  Hip adduction 4+ 4+  Hip internal rotation    Hip external rotation    Knee flexion 4- 4  Knee extension 5 5  Ankle dorsiflexion    Ankle plantarflexion    Ankle inversion    Ankle eversion    (Blank rows = not tested)  All tested in sitting    TRANSFERS: Assistive device utilized:  None  Sit to stand: Modified independence Stand to sit: Modified independence    Performs with wide BOS   GAIT: Gait pattern: step through pattern Distance walked: Clinic distances  Assistive device utilized: Single point cane and None Level of assistance: Modified independence and SBA Comments: No overt unsteadiness noted, but some path deviation noted with no AD    FUNCTIONAL TESTS:  5 times sit to  stand: 17.3 seconds with no UE support  30 seconds chair stand test: 9 sit <> stands with no UE support, pt reporting 4-5/10 RPE (<10 below normal for age) Gait speed: 12.4 seconds with no AD = 2.65 ft/sec     M-CTSIB  Condition 1: Firm Surface, EO 30 Sec, Normal Sway  Condition 2: Firm Surface, EC 30 Sec, Normal Sway  Condition 3: Foam Surface, EO 30 Sec, Mild Sway  Condition 4: Foam Surface, EC 30 Sec, Mild Sway                                                                                          TREATMENT DATE: 03-26-24  Patient seen for aquatic therapy today.  Treatment took place in water 3.6-4.5 feet deep depending upon activity.  Pt entered and exited the pool via step negotiation with use of hand rails modified independently.    Aquatic therapy at [facility] - pool temp. 90 degrees   Patient seen for aquatic therapy today.  Treatment took place in water 3.6-4.0 feet deep depending upon activity.  Pt entered and exited the pool via step negotiation with use of bil. Hand rails using step by step sequence modified independently.    Pt performed water walking forwards, backwards and sideways - 18' x 4 reps each direction- no floatation device used    Stretching - Rt and Lt hamstring stretch - runner's stretch for RLE stretching;   20 sec hold x 1 rep each LE  Marching in place 10 reps each leg - with contralateral UE flexion with 2 sec hold for balance; marching forwards across width of pool slowly with 1-2 sec hold on stance leg to facilitate improved SLS - no barbells used   Squats bil. LE's 10 reps holding onto bar bells for floatation   Pt performed standing hip flexion, abduction and extension 10 reps each leg without increased resistance    Marching 10 reps - no resistance added due to c/o back pain occurring after previous aquatic PT session                Pt requires buoyancy of water for support for joint unloading and for safety with balance exercises with reduced  risk of fall compared to that on land;     current of water provides perturbations for challenges with standing balance Viscosity of water is needed for resistance for strengthening exercises        PATIENT EDUCATION: Education details: Continue HEP, next appt for aquatic therapy  Person educated: Patient Education method: Explanation Education comprehension: verbalized understanding  HOME EXERCISE PROGRAM: Access Code: [email or web address] URL: [email or web address] Date: 02/08/2024 Prepared by: Johnnette Nakayama  Exercises - Seated Hamstring Curl with Anchored Resistance  -  1 x daily - 7 x weekly - 3 sets - 10 reps - Sitting Knee Extension with Resistance  - 1 x daily - 7 x weekly - 3 sets - 10 reps  In supine position; Bridging x 5 reps - attempted to perform with ball between knees but this caused a cramp in Lt hamstring so ball was removed Bridging with marching 5 reps Bridging with LE extension 5 reps Bridging with hip abduction/adduction 5 reps  SLR RLE and LLE 10 reps - no weight  Hip abduction - RLE and LLE - 10 reps each leg Clam shell exercise 10 reps each leg  HEP: Feet Together (Compliant Surface) Head Motion - Eyes Closed    Stand on compliant surface: __foam pad______ with feet together. Close eyes and move head slowly, up and down. Repeat __1__ times per session. Do __1__ sessions per day.      issued [facility] HEP for bil. LE strengthening - pt performed the following exercises 10 reps each  Access Code: [email or web address] URL: [email or web address] Date: 02/01/2024 Prepared by: Johnnette Nakayama  Exercises - Bridge  - 1 x daily - 7 x weekly - 1 sets - 10 reps - 5 sec hold hold - Supine Active Straight Leg Raise  - 1 x daily - 7 x weekly - 3 sets - 10 reps - Sidelying Hip Abduction  - 1 x daily - 7 x weekly - 3 sets - 10 reps - Sit to Stand  - 1 x daily - 7 x weekly - 1 sets - 10 reps  GOALS: Goals reviewed with patient? Yes  SHORT TERM GOALS:  Target date: 02/15/2024  Pt will be independent with initial HEP for strength/balance in order to build upon functional gains made in therapy. Baseline: Goal status: MET  2.  Further vestibular assessment to be written with LTG updated Baseline: goal not needed Goal status: N/A  3.  FGA to be assessed with LTG written.  Baseline: score 21/30 on 02-02-24 Goal status: Goal met 02-02-24  4.  Pt will improve 5x sit<>stand to less than or equal to 15 sec to demonstrate improved functional strength and transfer efficiency.  Baseline: 17.3 seconds with no UE support   11.2 seconds with no UE support  Goal status: MET    UPDATED LTG FOR RE-CERT FOR AQUATIC THERAPY:   LONG TERM GOALS: Target date: 04/03/2024 Pt will be independent with final aquatic HEP build upon functional gains made in therapy. Baseline:  Goal status: NEW   Pt will subjectively report a 50% improvement in pain since starting aquatic therapy.  Baseline:  Goal status: NEW   ASSESSMENT:  CLINICAL IMPRESSION: Aquatic PT session focused on bil. LE and core strengthening, balance exercises and water walking.  No increased resistance was used with LE exercises.  Pt tolerated core stabilization exercises well with use of small barbells in today's session.  Pt is progressing well towards LTG's.  Continue per POC.     OBJECTIVE IMPAIRMENTS: Abnormal gait, cardiopulmonary status limiting activity, decreased activity tolerance, decreased balance, decreased endurance, decreased mobility, difficulty walking, decreased strength, dizziness, postural dysfunction, and pain.   ACTIVITY LIMITATIONS: transfers and locomotion level  PARTICIPATION LIMITATIONS: community activity  PERSONAL FACTORS: Age, Behavior pattern, Past/current experiences, Time since onset of injury/illness/exacerbation, and 3+ comorbidities: AAA, COPD, CAD, fibromyalgia, HLD, HTN, hx of MI, PAD, lumbar spinal surgery 03/24/22, history of severe spinal stenosis at  L4-5 status post decompression and fusion from L3-L5 levels. Now with worsening spinal stenosis at L2-3  level.   are also affecting patient's functional outcome.   REHAB POTENTIAL: Good  CLINICAL DECISION MAKING: Evolving/moderate complexity  EVALUATION COMPLEXITY: Moderate  PLAN:  PT FREQUENCY: 2x/week  PT DURATION: 4 weeks  PLANNED INTERVENTIONS: 97164- PT Re-evaluation, 97110-Therapeutic exercises, 97530- Therapeutic activity, 97112- Neuromuscular re-education, 97535- Self Care, 96045- Manual therapy, [phone]- Gait training, [phone]- Canalith repositioning, [phone]- Aquatic Therapy, Patient/Family education, Balance training, Stair training, Vestibular training, and DME instructions  PLAN FOR NEXT SESSION:   Cont 1-2x/week for 4 weeks aquatic PT; add Ai Chi posture      Marilynne Dupuis, Celeste Cola, PT 03/27/2024, 10:01 PM

## 2024-04-02 ENCOUNTER — Ambulatory Visit: Payer: Medicare Other | Admitting: Pulmonary Disease

## 2024-04-02 ENCOUNTER — Ambulatory Visit: Admitting: Physical Therapy

## 2024-04-02 ENCOUNTER — Encounter: Payer: Self-pay | Admitting: Pulmonary Disease

## 2024-04-02 VITALS — BP 134/71 | HR 68 | Temp 97.7°F | Resp 18 | Ht 59.5 in | Wt 135.4 lb

## 2024-04-02 DIAGNOSIS — J4489 Other specified chronic obstructive pulmonary disease: Secondary | ICD-10-CM

## 2024-04-02 DIAGNOSIS — R2689 Other abnormalities of gait and mobility: Secondary | ICD-10-CM

## 2024-04-02 DIAGNOSIS — R2681 Unsteadiness on feet: Secondary | ICD-10-CM | POA: Diagnosis not present

## 2024-04-02 DIAGNOSIS — M6281 Muscle weakness (generalized): Secondary | ICD-10-CM | POA: Diagnosis not present

## 2024-04-02 DIAGNOSIS — G4736 Sleep related hypoventilation in conditions classified elsewhere: Secondary | ICD-10-CM | POA: Diagnosis not present

## 2024-04-02 DIAGNOSIS — R0600 Dyspnea, unspecified: Secondary | ICD-10-CM

## 2024-04-02 DIAGNOSIS — J9611 Chronic respiratory failure with hypoxia: Secondary | ICD-10-CM

## 2024-04-02 MED ORDER — AZITHROMYCIN 250 MG PO TABS: 250.0000 mg | ORAL_TABLET | ORAL | 2 refills | Status: AC

## 2024-04-02 NOTE — Progress Notes (Signed)
 @Patient  ID: Megan [name], female    DOB: 09-23-1946, 78 y.o.   MRN: [id number]  Chief Complaint  Patient presents with   Follow-up    Doing well    Referring provider: Luevenia Saha, MD  HPI:   78 y.o. whom are seeing for acute visit for evaluation of cough productive green phlegm, wheezing, chills previously seen for evaluation of dyspnea on exertion related to emphysema and likely cardiac causes.  Most recent GI note reviewed.    Most recent cardiology note reviewed.  Overall doing okay.  At last visit started on azithromycin  Monday Wednesday Friday.  This seems to help with her congestion.  Her cough.  She is pleased with this.  She ports good adherence to Breztri .  She noted a knot on her back when scratching her back.  This is examined below we discussed possible etiologies.  Unlikely to be from the lungs.  HPI at initial visit: She was in her usual state of health.  She developed onset of shortness of breath a few weeks ago.  Different than baseline dyspnea.  Recurrent shorter distances.  Preceded with aches, sounds like viral infection.  Still some heart ache as well which is concerning to her given her early CAD and significant coronary history.  This prompted presentation to the ED.  There CTA PE protocol was negative for PE but showed presevere emphysematous changes with predilection for the upper lobes otherwise clear on my review and interpretation.  She subsequent underwent a left heart catheterization showed stable coronary artery disease and prior PCI and CABG.  This was reassuring.  In the interim she was placed on Breztri .  She flexes helped some with her shortness of breath.  Able to do more.  Still get a little bit winded on longer walks etc.  She used to walk a mile and a half a day.  She is working back to getting to that distance.  PMH: Tobacco abuse in remission, hypertension, CAD, asthma, hyperlipidemia, seasonal allergies Surgical history: Back surgery, CABG,  PCI Family history: Mother and father with CAD Social history: Former smoker, quit 1997, lives in [place] / Pulmonary Flowsheets:   ACT:      No data to display          MMRC:     No data to display          Epworth:      No data to display          Tests:   FENO:  No results found for: "NITRICOXIDE"  PFT:    Latest Ref Rng & Units 03/03/2023    1:20 PM  PFT Results  FVC-Pre L 1.85   FVC-Predicted Pre % 83   FVC-Post L 1.84   FVC-Predicted Post % 82   Pre FEV1/FVC % % 76   Post FEV1/FCV % % 76   FEV1-Pre L 1.41   FEV1-Predicted Pre % 85   FEV1-Post L 1.39   DLCO uncorrected ml/min/mmHg 9.07   DLCO UNC% % 55   DLCO corrected ml/min/mmHg 8.81   DLCO COR %Predicted % 53   DLVA Predicted % 67   TLC L 3.82   TLC % Predicted % 85   RV % Predicted % 92   Personally reviewed and interpreted as normal spirometry, lung volumes within normal limits, DLCO moderately reduced  WALK:     09/15/2023    3:16 PM 06/22/2023   10:29 AM 06/02/2023  4:34 PM 02/17/2023    4:46 PM  SIX MIN WALK  Supplimental Oxygen  during Test? (L/min)   Yes Yes  O2 Flow Rate   4 L/min 2 L/min  Type   Pulse Continuous  2 Minute Oxygen  Saturation % 90 % 88 %    2 Minute HR 89 80    4 Minute Oxygen  Saturation % 92 % 90 %    4 Minute HR 94 79    6 Minute Oxygen  Saturation % 90 % 91 %    6 Minute HR 98 80    Tech Comments:   Patient walked 2 laps on POC to see what pulsed level she needed to keep sats >88-90%.  She requires 4L pulsed to stay above 88-90%.  Tolerated walk well,. Patient SaO2 when brought back to exam room was 88% on room air.  Patient was immediately put on oxygen  2 lpm continuous.  After 5 minutes, SaO2 at 93%. Patient was able to walk 1 lap on 2 lpm continuous oxygen  (tank) and SaO2 at 94%.  Patient c/o fatigue.  Patient was unable to walk any further laps.  Patient taken back to exam room. and SaO2 at  94% and heart rate 80 bpm.  Patient left with  an oxygen  tank from [employer] (from closet).  Oxygen  set on 2 lpm when patient taken to waiting room.  Patient did require assistance to get the oxygen  tank/roller in her car.    Imaging: Personally reviewed and as per EMR discussion in this note No results found.   Lab Results: Personally reviewed CBC    Component Value Date/Time   WBC 15.0 (H) 02/17/2023 1026   RBC 4.66 02/17/2023 1026   HGB 14.4 02/17/2023 1026   HGB 15.2 06/29/2022 1538   HCT 41.7 02/17/2023 1026   HCT 45.3 06/29/2022 1538   PLT 259.0 02/17/2023 1026   PLT 272 06/29/2022 1538   MCV 89.3 02/17/2023 1026   MCV 89 06/29/2022 1538   MCH 29.5 07/11/2022 1912   MCHC 34.5 02/17/2023 1026   RDW 12.8 02/17/2023 1026   RDW 12.4 06/29/2022 1538   LYMPHSABS 1.2 02/17/2023 1026   LYMPHSABS 2.0 01/06/2022 0954   MONOABS 0.9 02/17/2023 1026   EOSABS 0.1 02/17/2023 1026   EOSABS 0.2 01/06/2022 0954   BASOSABS 0.0 02/17/2023 1026   BASOSABS 0.0 01/06/2022 0954    BMET    Component Value Date/Time   NA 141 02/09/2024 0817   K 4.7 02/09/2024 0817   CL 105 02/09/2024 0817   CO2 25 02/09/2024 0817   GLUCOSE 94 02/09/2024 0817   GLUCOSE 119 (H) 02/17/2023 1026   BUN 9 02/09/2024 0817   CREATININE 0.72 02/09/2024 0817   CREATININE 0.71 06/29/2016 1057   CALCIUM  10.0 02/09/2024 0817   GFRNONAA >60 07/11/2022 1912   GFRAA 89 01/01/2020 0835    BNP    Component Value Date/Time   BNP 94.2 06/29/2022 1538   BNP 862.1 (H) 01/08/2021 1630    ProBNP    Component Value Date/Time   PROBNP 145.0 (H) 02/17/2023 1026    Specialty Problems       Pulmonary Problems   COPD with chronic bronchitis (HCC)   Centrilobular emphysema (HCC)   Chronic respiratory failure (HCC)   Pneumonia   Oxygen  dependent    Allergies  Allergen Reactions   Brilinta  [Ticagrelor ] Shortness Of Breath   Bextra [Valdecoxib] Other (See Comments)    Increased lft's    Hydrocod Poli-Chlorphe Poli Er Rash  Lipitor  [Atorvastatin ] Other  (See Comments)    Leg weakness    Mevacor [Lovastatin] Other (See Comments)    Muscle weakness    Plavix  [Clopidogrel  Bisulfate] Itching and Other (See Comments)    Hands and feet tingle and itch   Zocor [Simvastatin] Other (See Comments)    Bones hurt    Doxycycline  Diarrhea and Nausea And Vomiting   Metoprolol  Other (See Comments)    Hair loss   Morphine  And Codeine  Itching and Nausea Only   Codeine  Itching and Rash    Hyper-active   Lisinopril Cough    Immunization History  Administered Date(s) Administered   Fluad Quad(high Dose 65+) 07/30/2019, 08/10/2022   Influenza, High Dose Seasonal PF 08/13/2017, 08/26/2018, 09/15/2020   Influenza, Seasonal, Injecte, Preservative Fre 09/21/2014, 09/06/2015, 07/21/2016   Influenza,inj,quad, With Preservative 08/06/2017   Influenza-Unspecified 08/10/2017, 10/13/2021   Moderna Covid-19 Fall Seasonal Vaccine 13yrs & older 08/13/2023   PFIZER(Purple Top)SARS-COV-2 Vaccination 12/22/2019, 01/12/2020, 09/15/2020   PPD Test 09/13/1979, 03/10/2021   Pfizer Covid-19 Vaccine Bivalent Booster 34yrs & up 10/13/2021   Pneumococcal Conjugate-13 12/31/2016   Pneumococcal Polysaccharide-23 12/16/2014, 08/01/2019   Respiratory Syncytial Virus Vaccine,Recomb Aduvanted(Arexvy) 10/06/2022   Td 05/13/2008   Td (Adult), 2 Lf Tetanus Toxid, Preservative Free 05/13/2008   Tdap 08/09/2012   Zoster Recombinant(Shingrix) 01/13/2018, 03/14/2018   Zoster, Live 11/03/2010    Past Medical History:  Diagnosis Date   AAA (abdominal aortic aneurysm) (HCC)    a. s/p stent grafting in 2009.   Adenomatous colon polyp 05/2001   Adenomatous duodenal polyp    Allergy    Anginal pain (HCC)    COPD (chronic obstructive pulmonary disease) (HCC)    pt denies COPD   Coronary artery disease    a. s/p CABG in 1995;  b. 05/2013 Neg MV, EF 82%;  c. 06/2013 Cath: LM nl, LAD 40-50p, LCX nl, RCA 80p/168m, VG->RCA->PDA->PLSA min irregs, LIMA->LAD atretic, vigorous LV fxn.    Diabetes mellitus without complication (HCC)    history of, resolved. 2017   Diverticulosis    Eczema, dyshidrotic    Eczema, dyshidrotic 1986   Dr. Carmon Christen   Emphysema of lung [facility])    Fatty liver    Fibromyalgia    GERD (gastroesophageal reflux disease)    Hyperlipidemia    Hypertension    Migraine    Myocardial infarction [facility])    Osteoarthritis    Osteoporosis    unsure, having a bone scan soon   Peripheral arterial disease (HCC)    left leg diagnosed by Dr Santiago Cuff   Renal artery stenosis [facility])    a. 2008 s/p PTA    Tobacco History: Social History   Tobacco Use  Smoking Status Former   Current packs/day: 0.00   Types: Cigarettes   Quit date: 09/05/1996   Years since quitting: 27.5   Passive exposure: Never  Smokeless Tobacco Never   Counseling given: Not Answered   Continue to not smoke  Outpatient Encounter Medications as of 04/02/2024  Medication Sig   Accu-Chek FastClix Lancets MISC Check blood sugar once daily PRN   albuterol  (PROAIR  HFA) 108 (90 Base) MCG/ACT inhaler Inhale 2 puffs into the lungs every 6 (six) hours as needed for wheezing or shortness of breath.   albuterol  (PROVENTIL ) (2.5 MG/3ML) 0.083% nebulizer solution Take 3 mLs (2.5 mg total) by nebulization every 6 (six) hours as needed for wheezing or shortness of breath.   ALPRAZolam  (XANAX ) 0.25 MG tablet Take 1 tablet (0.25 mg total)  by mouth at bedtime as needed for sleep.   amLODipine  (NORVASC ) 5 MG tablet Take 1/2 tablet ( 2.5 mg ) daily   aspirin  EC 81 MG tablet Take 1 tablet (81 mg total) by mouth daily.   atorvastatin  (LIPITOR ) 80 MG tablet TAKE 1 TABLET BY MOUTH EVERY DAY   B Complex-Biotin-FA (B-100 COMPLEX PO) Take 1 tablet by mouth every evening.   bismuth subsalicylate (PEPTO BISMOL) 262 MG/15ML suspension Take 30 mLs by mouth every 6 (six) hours as needed for indigestion or diarrhea or loose stools.   Budeson-Glycopyrrol-Formoterol  (BREZTRI  AEROSPHERE) 160-9-4.8 MCG/ACT AERO  Inhale 2 puffs into the lungs in the morning and at bedtime.   cetirizine (ZYRTEC) 10 MG tablet Take 10 mg by mouth daily as needed for allergies.   cholecalciferol  (VITAMIN D3) 25 MCG (1000 UNIT) tablet Take 1,000 Units by mouth every evening.   CORDRAN  4 MCG/SQCM TAPE Apply 1 each topically daily as needed (Rash).    cycloSPORINE  (RESTASIS ) 0.05 % ophthalmic emulsion Place 1 drop into both eyes 2 (two) times daily.    ezetimibe  (ZETIA ) 10 MG tablet TAKE 1 TABLET BY MOUTH EVERY DAY   FLUZONE HIGH-DOSE 0.5 ML injection    gabapentin  (NEURONTIN ) 300 MG capsule Take 1 capsule (300 mg total) by mouth 2 (two) times daily.   glucose blood (ACCU-CHEK SMARTVIEW) test strip CHECK BLOOD SUGAR ONCE DAILY AS NEEDED   HYDROcodone  bit-homatropine (HYCODAN) 5-1.5 MG/5ML syrup Take 5 mLs by mouth every 6 (six) hours as needed for cough.   isosorbide  mononitrate (IMDUR ) 30 MG 24 hr tablet TAKE 3 TABLETS ( 90 MG ) EVERY MORNING AND 1 TABLET (30 MG ) EVERY EVENING   losartan  (COZAAR ) 100 MG tablet TAKE 1 TABLET BY MOUTH EVERY DAY   metoprolol  succinate (TOPROL -XL) 25 MG 24 hr tablet TAKE 1 TABLET (25 MG TOTAL) BY MOUTH DAILY.   Multiple Vitamins-Minerals (MULTIVITAMIN WITH MINERALS) tablet Take 1 tablet by mouth every evening.   nitroGLYCERIN  (NITROSTAT ) 0.4 MG SL tablet Place 1 tablet (0.4 mg total) under the tongue every 5 (five) minutes as needed for chest pain.   omeprazole  (PRILOSEC) 40 MG capsule Take 1 capsule (40 mg total) by mouth daily.   ondansetron  (ZOFRAN ) 8 MG tablet Take 1 tablet (8 mg total) by mouth every 6 (six) hours as needed for nausea or vomiting.   oxyCODONE -acetaminophen  (PERCOCET) 10-325 MG tablet Take 1 tablet by mouth in the morning and at bedtime. Can take one tablet three times a day as needed for back pain   polyethylene glycol (MIRALAX  / GLYCOLAX ) 17 g packet Take 17 g by mouth daily as needed for moderate constipation.   prasugrel  (EFFIENT ) 5 MG TABS tablet TAKE 1 TABLET (5 MG TOTAL)  BY MOUTH DAILY.   PRESCRIPTION MEDICATION Place 2 puffs into both ears daily as needed (ear infections). [facility]SA[facility] ear powder   Simethicone  (GAS-X PO) Take 1-2 tablets by mouth daily as needed (gas).   SPIKEVAX syringe    torsemide  (DEMADEX ) 20 MG tablet Take 1 tablet (20 mg total) by mouth daily. Take 1/2 tablet daily as needed for swelling   tretinoin  (RETIN-A ) 0.1 % cream Apply 1 application. topically every evening.   azithromycin  (ZITHROMAX ) 250 MG tablet Take 1 tablet (250 mg total) by mouth 3 (three) times a week. Monday Wednesday Friday   [DISCONTINUED] azithromycin  (ZITHROMAX ) 250 MG tablet Take 1 tablet (250 mg total) by mouth 3 (three) times a week. Monday Wednesday Friday (Patient not taking: Reported on 04/02/2024)  No facility-administered encounter medications on file as of 04/02/2024.     Review of Systems  Review of Systems  N/a Physical Exam  BP 134/71 (BP Location: Left Arm, Patient Position: Sitting)   Pulse 68   Temp 97.7 F (36.5 C) (Oral)   Resp 18   Ht 4' 11.5" (1.511 m)   Wt 135 lb 6.4 oz (61.4 kg)   BMI 26.89 kg/m   Wt Readings from Last 5 Encounters:  04/02/24 135 lb 6.4 oz (61.4 kg)  02/13/24 132 lb 9.6 oz (60.1 kg)  12/26/23 135 lb (61.2 kg)  12/14/23 128 lb (58.1 kg)  11/21/23 134 lb 6.4 oz (61 kg)    BMI Readings from Last 5 Encounters:  04/02/24 26.89 kg/m  02/13/24 26.78 kg/m  12/26/23 27.27 kg/m  12/14/23 25.85 kg/m  11/21/23 27.15 kg/m     Physical Exam General: Well-appearing, sitting in chair Eyes: EOMI, no icterus Neck: Supple, no JVP Pulmonary: Clear, no wheeze no crackles, normal work of breathing Cardiovascular warm, no edema Abdomen: Nondistended, bowel sounds present MSK: No synovitis, no joint effusion, mobile soft knot mid thoracic area on the left that is nontender Neuro: Normal gait, no weakness Psych: Normal mood, full affect   Assessment & Plan:   Dyspnea on exertion: Likely multifactorial related to cardiac  issues, worsening diastology with exercise given significant left atrial dilation.  Also suspect large contribution from emphysema.  Severe emphysema on imaging.  Continue Breztri .  Continue cardiology follow-up.  PFTs normal with exception of decreased DLCO due to emphysema.  Nocturnal hypoxemia: Unlikely to improve.  Repeat over the oximetry fall 2024 with ongoing oxygen  need.  Advised continued use of oxygen .  Chronic cough: Related to emphysema.  Chronic bronchitis, asthma.  Continue Breztri .  Okay for intermittent courses of narcotic cough syrup given significant and severe coughing fits.  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  Back lesion: Soft mobile nontender compressible.  Most likely small lipoma.  Unlikely be related to lungs.  She has PCP evaluation tomorrow, they likely will have a better idea of what to do if anything.  Return in about 4 months (around 08/02/2024) for f/u Dr. Marygrace Snellen.   Guerry Leek, MD 04/02/2024

## 2024-04-02 NOTE — Patient Instructions (Signed)
 Nice to see you again  I am glad the azithromycin  antibiotic has helped some  This was refilled today, continue 1 tablet once a day on Monday Wednesday and Friday  No other changes to medication  Let me know if there is any concern about the knot on your back from your primary care doctor and I can help arrange imaging if needed  Overall I think this is probably a small collection of fat that can overgrow in some places but I think your PCP will likely have a better idea  Return to clinic in 4 months or sooner as needed with Dr. Marygrace Snellen

## 2024-04-03 ENCOUNTER — Encounter: Payer: Self-pay | Admitting: Physical Therapy

## 2024-04-03 NOTE — Therapy (Signed)
 OUTPATIENT PHYSICAL THERAPY NEURO/AQUATIC TREATMENT NOTE/DISCHARGE SUMMARY   Patient Name: Megan [name] MRN: [id number] DOB:18-Aug-1946, 78 y.o., female Today's Date: 04/03/2024   PCP: Luevenia Saha, MD  REFERRING PROVIDER: Omega Bible, MD  END OF SESSION:  PT End of Session - 04/03/24 1953     Visit Number 18    Number of Visits 20    Date for PT Re-Evaluation 05/05/24    Authorization Type [place] MEDICARE    Authorization Time Period 01-24-24 - 03-20-24; 03-21-24 - 04-18-24    Authorization - Visit Number 18    Authorization - Number of Visits 19    PT Start Time 1435    PT Stop Time 1513    PT Time Calculation (min) 38 min    Equipment Utilized During Treatment Other (comment)   small noodle, small barbells   Activity Tolerance Patient tolerated treatment well    Behavior During Therapy WFL for tasks assessed/performed                 Past Medical History:  Diagnosis Date   AAA (abdominal aortic aneurysm) (HCC)    a. s/p stent grafting in 2009.   Adenomatous colon polyp 05/2001   Adenomatous duodenal polyp    Allergy    Anginal pain (HCC)    COPD (chronic obstructive pulmonary disease) (HCC)    pt denies COPD   Coronary artery disease    a. s/p CABG in 1995;  b. 05/2013 Neg MV, EF 82%;  c. 06/2013 Cath: LM nl, LAD 40-50p, LCX nl, RCA 80p/183m, VG->RCA->PDA->PLSA min irregs, LIMA->LAD atretic, vigorous LV fxn.   Diabetes mellitus without complication (HCC)    history of, resolved. 2017   Diverticulosis    Eczema, dyshidrotic    Eczema, dyshidrotic 1986   Dr. Carmon Christen   Emphysema of lung [facility])    Fatty liver    Fibromyalgia    GERD (gastroesophageal reflux disease)    Hyperlipidemia    Hypertension    Migraine    Myocardial infarction [facility])    Osteoarthritis    Osteoporosis    unsure, having a bone scan soon   Peripheral arterial disease (HCC)    left leg diagnosed by Dr Santiago Cuff   Renal artery stenosis [facility])    a. 2008 s/p  PTA   Past Surgical History:  Procedure Laterality Date   ABDOMINAL AORTAGRAM N/A 03/06/2014   Procedure: ABDOMINAL AORTAGRAM;  Surgeon: Mayo Speck, MD;  Location: [facility] CATH LAB;  Service: Cardiovascular;  Laterality: N/A;   ABDOMINAL AORTIC ANEURYSM REPAIR  2009   stenting   ABDOMINAL HYSTERECTOMY     Total, 1992   BREAST BIOPSY     CARDIAC CATHETERIZATION     CARDIOVASCULAR STRESS TEST  11/11/2009   normal study   CARPAL TUNNEL RELEASE  08/1993   left wrist   CARPAL TUNNEL RELEASE  01/1997   right   CATARACT EXTRACTION, BILATERAL  2021   CORONARY ARTERY BYPASS GRAFT  07/1994   Triple by Dr Brinton Canavan   CORONARY STENT INTERVENTION N/A 02/02/2021   Procedure: CORONARY STENT INTERVENTION;  Surgeon: Sammy Crisp, MD;  Location: [facility] INVASIVE CV LAB;  Service: Cardiovascular;  Laterality: N/A;   CORONARY STENT INTERVENTION N/A 06/26/2021   Procedure: CORONARY STENT INTERVENTION;  Surgeon: Arnoldo Lapping, MD;  Location: [facility] INVASIVE CV LAB;  Service: Cardiovascular;  Laterality: N/A;   CORONARY ULTRASOUND/IVUS N/A 06/26/2021   Procedure: Intravascular Ultrasound/IVUS;  Surgeon: Arnoldo Lapping, MD;  Location: [facility]  INVASIVE CV LAB;  Service: Cardiovascular;  Laterality: N/A;   CORONARY ULTRASOUND/IVUS N/A 09/03/2021   Procedure: Intravascular Ultrasound/IVUS;  Surgeon: Kyra Phy, MD;  Location: [facility] INVASIVE CV LAB;  Service: Cardiovascular;  Laterality: N/A;   dental implants     EYE SURGERY  03/2020   cataract   HAND SURGERY Right 09/2002   Right hand pulley release   LAMINECTOMY WITH POSTERIOR LATERAL ARTHRODESIS LEVEL 1 N/A 03/24/2022   Procedure: LAMINECTOMY, FORAMINOTOMY, LUMBAR TWO- THREE ; INTERTRANSVERSE FUSION LUMBAR TWO - LUMBAR THREE RIGHT;  Surgeon: Isadora Mar, MD;  Location: [facility] OR;  Service: Neurosurgery;  Laterality: N/A;   LEFT HEART CATH AND CORONARY ANGIOGRAPHY N/A 06/26/2021   Procedure: LEFT HEART CATH AND CORONARY ANGIOGRAPHY;  Surgeon: Arnoldo Lapping, MD;   Location: [facility] INVASIVE CV LAB;  Service: Cardiovascular;  Laterality: N/A;   LEFT HEART CATH AND CORS/GRAFTS ANGIOGRAPHY N/A 02/02/2021   Procedure: LEFT HEART CATH AND CORS/GRAFTS ANGIOGRAPHY;  Surgeon: Sammy Crisp, MD;  Location: [facility] INVASIVE CV LAB;  Service: Cardiovascular;  Laterality: N/A;   LEFT HEART CATH AND CORS/GRAFTS ANGIOGRAPHY N/A 09/03/2021   Procedure: LEFT HEART CATH AND CORS/GRAFTS ANGIOGRAPHY;  Surgeon: Kyra Phy, MD;  Location: [facility] INVASIVE CV LAB;  Service: Cardiovascular;  Laterality: N/A;   LEFT HEART CATH AND CORS/GRAFTS ANGIOGRAPHY N/A 07/15/2022   Procedure: LEFT HEART CATH AND CORS/GRAFTS ANGIOGRAPHY;  Surgeon: [place], Peter M, MD;  Location: [facility] INVASIVE CV LAB;  Service: Cardiovascular;  Laterality: N/A;   LEFT HEART CATHETERIZATION WITH CORONARY/GRAFT ANGIOGRAM N/A 06/11/2013   Procedure: LEFT HEART CATHETERIZATION WITH Estella Helling;  Surgeon: Lake Pilgrim, MD;  Location: [facility] CATH LAB;  Service: Cardiovascular;  Laterality: N/A;   MULTIPLE TOOTH EXTRACTIONS  2000   All upper teeth.  Has full dneture.    RENAL ARTERY STENT  2008   SHOULDER ARTHROSCOPY WITH ROTATOR CUFF REPAIR Right 09/27/2018   Procedure: Right shoulder mini open rotator cuff repair;  Surgeon: Orvan Blanch, MD;  Location: [facility] ORS;  Service: Orthopedics;  Laterality: Right;  90 mins   thyroid  cyst apiration  05/1997 and 07/1998   TONSILECTOMY, ADENOIDECTOMY, BILATERAL MYRINGOTOMY AND TUBES  1989   TONSILLECTOMY     TOTAL ABDOMINAL HYSTERECTOMY  1992   Patient Active Problem List   Diagnosis Date Noted   Oxygen  dependent 04/29/2023   Centrilobular emphysema (HCC) 02/17/2023   Chronic respiratory failure (HCC) 02/17/2023   Pneumonia 02/17/2023   Senile purpura (HCC) 12/22/2022   Daily consumption of alcohol 12/22/2022   S/P lumbar laminectomy 03/24/2022   Thyroid  nodule greater than or equal to 1.5 cm in diameter incidentally noted on imaging study 08/27/2021   Angina pectoris  (HCC) 06/25/2021   History of compression fracture of spine - lumbar 06/19/2021   History of lumbosacral spine surgery 03/03/2021   NSTEMI (non-ST elevated myocardial infarction) (HCC) 2022 01/31/2021   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/26/2021   Neuropathic pain 12/05/2020   Gait instability 09/09/2020   Lumbar stenosis without neurogenic claudication 02/26/2020   COPD with chronic bronchitis (HCC) 09/28/2018   Essential (primary) hypertension 07/10/2018   IFG (impaired fasting glucose) 11/10/2017   DJD (degenerative joint disease), lumbosacral 11/10/2017   Fibromyalgia 11/10/2017   Dyshidrotic eczema 11/10/2017   GERD (gastroesophageal reflux disease) 11/10/2017   Osteopenia 11/10/2017   Does use hearing aid 11/10/2017   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   Fatty liver 11/10/2017   Coronary artery disease    Peripheral vascular disease with claudication (HCC)  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   History of abdominal aortic aneurysm repair 05/18/2011   History of renal artery stenosis 05/18/2011   Hyperlipidemia LDL goal <70     ONSET DATE: 12/14/2023  REFERRING DIAG: R53.1 (ICD-10-CM) - Weakness H55.00 (ICD-10-CM) - Nystagmus H81.4 (ICD-10-CM) - Central positional vertigo  THERAPY DIAG:  Unsteadiness on feet  Other abnormalities of gait and mobility  Muscle weakness (generalized)  Rationale for Evaluation and Treatment: Rehabilitation  SUBJECTIVE:                                                                                                                                                                                              SUBJECTIVE STATEMENT:  Pt reports mild back pain today - overall doing OK Pt accompanied by: self  PERTINENT HISTORY: PMH: AAA, COPD, CAD, fibromyalgia, HLD, HTN, hx of MI, PAD, lumbar spinal surgery 03/24/22, history of severe spinal stenosis at L4-5 status post decompression and fusion from L3-L5 levels. Now with worsening spinal  stenosis at L2-3 level.   Per Dr. Salli Crawley: Gait difficulty likely related to hip flexor weakness associated with L2-3 spinal stenosis.   PAIN:  Are you having pain? Yes, in pain most of the time, has fibromyalgia, on oxycodone  - 6/10 generalized pain; on oxycodone  and aspirin   PRECAUTIONS: Fall   FALLS: Has patient fallen in last 6 months? No  LIVING ENVIRONMENT: Lives with: lives alone, husband in a nursing house  Lives in: House/apartment Stairs: 3rd floor apartment, can use an elevator  Has following equipment at home: Single point cane, Environmental consultant - 4 wheeled, and Grab bars  PLOF: Independent and Independent with community mobility with device  PATIENT GOALS: Wants to work on her strength, re-assess for BPPV, working on some balance    OBJECTIVE:  Note: Objective measures were completed at Evaluation unless otherwise noted.  DIAGNOSTIC FINDINGS:  IMPRESSION:    Unremarkable MRI brain (with and without).  No acute findings.    COGNITION: Overall cognitive status: Within functional limits for tasks assessed   SENSATION: Light touch: WFL  POSTURE: forward head   LOWER EXTREMITY MMT:    MMT Right Eval Left Eval  Hip flexion 3 3  Hip extension    Hip abduction 4+ 4+  Hip adduction 4+ 4+  Hip internal rotation    Hip external rotation    Knee flexion 4- 4  Knee extension 5 5  Ankle dorsiflexion    Ankle plantarflexion    Ankle inversion    Ankle eversion    (Blank rows = not tested)  All tested in sitting    TRANSFERS: Assistive device utilized: None  Sit to stand: Modified independence Stand to sit: Modified independence    Performs with wide BOS   GAIT: Gait pattern: step through pattern Distance walked: Clinic distances  Assistive device utilized: Single point cane and None Level of assistance: Modified independence and SBA Comments: No overt unsteadiness noted, but some path deviation noted with no AD    FUNCTIONAL TESTS:  5 times sit to  stand: 17.3 seconds with no UE support  30 seconds chair stand test: 9 sit <> stands with no UE support, pt reporting 4-5/10 RPE (<10 below normal for age) Gait speed: 12.4 seconds with no AD = 2.65 ft/sec     M-CTSIB  Condition 1: Firm Surface, EO 30 Sec, Normal Sway  Condition 2: Firm Surface, EC 30 Sec, Normal Sway  Condition 3: Foam Surface, EO 30 Sec, Mild Sway  Condition 4: Foam Surface, EC 30 Sec, Mild Sway                                                                                          TREATMENT DATE: 04-02-24  Patient seen for aquatic therapy today.  Treatment took place in water 3.6-4.5 feet deep depending upon activity.  Pt entered and exited the pool via step negotiation with use of hand rails modified independently.    Aquatic therapy at [facility] - pool temp. 90 degrees   Patient seen for aquatic therapy today.  Treatment took place in water 3.6-4.0 feet deep depending upon activity.  Pt entered and exited the pool via step negotiation with use of bil. Hand rails using step by step sequence modified independently.    Pt performed water walking forwards, backwards and sideways - 18' x 4 reps each direction- no floatation device used    Stretching - Rt and Lt hamstring stretch - runner's stretch for RLE stretching;   20 sec hold x 1 rep each LE  Marching in place 10 reps each leg - with contralateral UE flexion with 2 sec hold for balance; marching forwards across width of pool slowly with 1-2 sec hold on stance leg to facilitate improved SLS - no barbells used   Squats bil. LE's 10 reps holding onto bar bells for floatation   Marching 10 reps - no resistance added due to c/o back pain occurring after previous aquatic PT session                Pt requires buoyancy of water for support for joint unloading and for safety with balance exercises with reduced risk of fall compared to that on land;     current of water provides perturbations for challenges with standing  balance Viscosity of water is needed for resistance for strengthening exercises        PATIENT EDUCATION: Education details: Continue HEP, next appt for aquatic therapy  Person educated: Patient Education method: Explanation Education comprehension: verbalized understanding  HOME EXERCISE PROGRAM: Access Code: [email or web address] URL: [email or web address] Date: 02/08/2024 Prepared by: Johnnette Nakayama  Exercises - Seated Hamstring Curl with Anchored Resistance  - 1 x daily - 7 x weekly - 3 sets - 10 reps - Sitting Knee Extension with Resistance  -  1 x daily - 7 x weekly - 3 sets - 10 reps  In supine position; Bridging x 5 reps - attempted to perform with ball between knees but this caused a cramp in Lt hamstring so ball was removed Bridging with marching 5 reps Bridging with LE extension 5 reps Bridging with hip abduction/adduction 5 reps  SLR RLE and LLE 10 reps - no weight  Hip abduction - RLE and LLE - 10 reps each leg Clam shell exercise 10 reps each leg  HEP: Feet Together (Compliant Surface) Head Motion - Eyes Closed    Stand on compliant surface: __foam pad______ with feet together. Close eyes and move head slowly, up and down. Repeat __1__ times per session. Do __1__ sessions per day.      issued [facility] HEP for bil. LE strengthening - pt performed the following exercises 10 reps each  Access Code: [email or web address] URL: [email or web address] Date: 02/01/2024 Prepared by: Johnnette Nakayama  Exercises - Bridge  - 1 x daily - 7 x weekly - 1 sets - 10 reps - 5 sec hold hold - Supine Active Straight Leg Raise  - 1 x daily - 7 x weekly - 3 sets - 10 reps - Sidelying Hip Abduction  - 1 x daily - 7 x weekly - 3 sets - 10 reps - Sit to Stand  - 1 x daily - 7 x weekly - 1 sets - 10 reps  GOALS: Goals reviewed with patient? Yes  SHORT TERM GOALS: Target date: 02/15/2024  Pt will be independent with initial HEP for strength/balance in order to build upon  functional gains made in therapy. Baseline: Goal status: MET  2.  Further vestibular assessment to be written with LTG updated Baseline: goal not needed Goal status: N/A  3.  FGA to be assessed with LTG written.  Baseline: score 21/30 on 02-02-24 Goal status: Goal met 02-02-24  4.  Pt will improve 5x sit<>stand to less than or equal to 15 sec to demonstrate improved functional strength and transfer efficiency.  Baseline: 17.3 seconds with no UE support   11.2 seconds with no UE support  Goal status: MET    UPDATED LTG FOR RE-CERT FOR AQUATIC THERAPY:   LONG TERM GOALS: Target date: 04/03/2024 Pt will be independent with final aquatic HEP build upon functional gains made in therapy. Baseline:  Goal status: Goal met 04-02-24   Pt will subjectively report a 50% improvement in pain since starting aquatic therapy.  Baseline:  Goal status: Goal inconsistently met - 04-02-24   ASSESSMENT:   CLINICAL IMPRESSION: Pt has met updated LTG #1 and inconsistently met LTG #2 as back pain fluctuates in intensity.  Today's aquatic PT session focused on core stabilization, balance exercises and water walking.  Pt tolerated aquatic exercises well and is independent in aquatic exercises if she so chooses to join facility with a pool for continuation of aquatic exercises.  D/C due to completion of program.     OBJECTIVE IMPAIRMENTS: Abnormal gait, cardiopulmonary status limiting activity, decreased activity tolerance, decreased balance, decreased endurance, decreased mobility, difficulty walking, decreased strength, dizziness, postural dysfunction, and pain.   ACTIVITY LIMITATIONS: transfers and locomotion level  PARTICIPATION LIMITATIONS: community activity  PERSONAL FACTORS: Age, Behavior pattern, Past/current experiences, Time since onset of injury/illness/exacerbation, and 3+ comorbidities: AAA, COPD, CAD, fibromyalgia, HLD, HTN, hx of MI, PAD, lumbar spinal surgery 03/24/22, history of severe  spinal stenosis at L4-5 status post decompression and fusion from L3-L5 levels. Now with worsening spinal stenosis at  L2-3 level.   are also affecting patient's functional outcome.   REHAB POTENTIAL: Good  CLINICAL DECISION MAKING: Evolving/moderate complexity  EVALUATION COMPLEXITY: Moderate  PLAN:  PT FREQUENCY: 2x/week  PT DURATION: 4 weeks  PLANNED INTERVENTIONS: 97164- PT Re-evaluation, 97110-Therapeutic exercises, 97530- Therapeutic activity, 97112- Neuromuscular re-education, 97535- Self Care, 81191- Manual therapy, [phone]- Gait training, [phone]- Canalith repositioning, [phone]- Aquatic Therapy, Patient/Family education, Balance training, Stair training, Vestibular training, and DME instructions  PLAN FOR NEXT SESSION:   D/C 04-02-24  PHYSICAL THERAPY DISCHARGE SUMMARY  Visits from Start of Care: 18  Current functional level related to goals / functional outcomes: See above for progress towards goals   Remaining deficits: Continued c/o back pain   Education / Equipment: Pt has been instructed in HEP for aquatic exercises and land based exercises for strengthening.    Patient agrees to discharge. Patient goals were partially met. Patient is being discharged due to meeting the stated rehab goals.     Dorsie Gaunt, PT 04/03/2024, 8:01 PM

## 2024-04-04 ENCOUNTER — Ambulatory Visit (INDEPENDENT_AMBULATORY_CARE_PROVIDER_SITE_OTHER): Admitting: Family Medicine

## 2024-04-04 ENCOUNTER — Encounter: Payer: Self-pay | Admitting: Family Medicine

## 2024-04-04 VITALS — BP 131/66 | HR 66 | Temp 97.9°F | Ht 59.5 in | Wt 134.8 lb

## 2024-04-04 DIAGNOSIS — J9611 Chronic respiratory failure with hypoxia: Secondary | ICD-10-CM

## 2024-04-04 DIAGNOSIS — I10 Essential (primary) hypertension: Secondary | ICD-10-CM

## 2024-04-04 DIAGNOSIS — D171 Benign lipomatous neoplasm of skin and subcutaneous tissue of trunk: Secondary | ICD-10-CM | POA: Diagnosis not present

## 2024-04-04 DIAGNOSIS — I25708 Atherosclerosis of coronary artery bypass graft(s), unspecified, with other forms of angina pectoris: Secondary | ICD-10-CM

## 2024-04-04 DIAGNOSIS — J4489 Other specified chronic obstructive pulmonary disease: Secondary | ICD-10-CM | POA: Diagnosis not present

## 2024-04-04 NOTE — Patient Instructions (Signed)
Please return for your annual complete physical; please come fasting.   If you have any questions or concerns, please don't hesitate to send me a message via MyChart or call the office at [phone]. Thank you for visiting with us today! It's our pleasure caring for you.   

## 2024-04-04 NOTE — Progress Notes (Signed)
 Subjective  CC:  Chief Complaint  Patient presents with   knot on back    Pt stated that she found a knot on her back about a week ago.     HPI: Megan [name] is a 78 y.o. female who presents to the office today to address the problems listed above in the chief complaint. Discussed the use of AI scribe software for clinical note transcription with the patient, who gave verbal consent to proceed.  History of Present Illness Megan [name] is a 78 year old female who presents with a lump on her back.  She discovered a lump on her back while using a back scratcher last week. It is palpable when she turns or scratches her back. The lump does not cause significant pain but is noticeable with certain movements. She is concerned it might be a lipoma, similar to what her dog had.  She has a history of emphysema, attributed to smoking, and experiences difficulty breathing, especially when talking and walking simultaneously, leading to chest tightness. She uses oxygen  during exercise and occasionally at night, although she finds it challenging to keep the oxygen  on while sleeping due to movement. She uses a Bextra inhaler in the morning and another inhaler as needed.  Her husband, Megan [name], is in hospice care and actively dying. He has been on hospice since July of the previous year and has recently declined significantly, with hospice staff visiting daily. He is not eating and is on morphine  and anti-anxiety medication. She describes the emotional and physical toll of his condition on her life, noting that she visits him daily but does not stay long.  She has a history of coronary artery disease with three stents, one of which has failed, and she cannot undergo further bypass surgery. She wants to live long enough to care for her husband.  She uses Xanax  sparingly due to unsteadiness on her feet and concerns about safety when waking up. She has a support system of two or three good friends but no  family nearby.   Assessment  1. Lipoma of torso   2. Chronic respiratory failure with hypoxia (HCC)   3. COPD with chronic bronchitis (HCC)   4. Coronary artery disease of bypass graft of native heart with stable angina pectoris (HCC)   5. Essential (primary) hypertension      Plan  Assessment and Plan Assessment & Plan Emphysema Chronic emphysema with exertional dyspnea and oxygen  desaturation. Smoking exacerbates condition. Advised on activity pacing and rest. - Continue oxygen  during exercise and as needed at night. - Monitor oxygen  saturation regularly. - Avoid excessive exertion and ensure rest.  Lipoma Incidental lipoma on back, asymptomatic and non-interfering with activities. - Monitor for changes in size or discomfort. - Consider removal if painful or interfering with activities.  Bp is controlled. CAD is medically managed.   Goals of Care Managing husband's impending death in hospice. Prepared for emotional and physical toll. Plans for funeral in place. - Support system includes close friends. - Funeral plans include a mass per husband's wishes. - Aware of health limitations, managing care accordingly.  F/u July for cpe  No orders of the defined types were placed in this encounter.  No orders of the defined types were placed in this encounter.    I reviewed the patients updated PMH, FH, and SocHx.    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    Priority: High   Coronary artery disease     Priority: High   Peripheral vascular disease with claudication (HCC)     Priority: High   History of abdominal aortic aneurysm repair 05/18/2011     Priority: High   Hyperlipidemia LDL goal <70     Priority: High   History of compression fracture of spine - lumbar 06/19/2021    Priority: Medium    History of lumbosacral spine surgery 03/03/2021    Priority: Medium    Neuropathic pain 12/05/2020    Priority: Medium    Gait instability 09/09/2020    Priority: Medium    Lumbar stenosis without neurogenic claudication 02/26/2020    Priority: Medium    DJD (degenerative joint disease), lumbosacral 11/10/2017    Priority: Medium    Fibromyalgia 11/10/2017    Priority: Medium    GERD (gastroesophageal reflux disease) 11/10/2017    Priority: Medium    Fatty liver 11/10/2017    Priority: Medium    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    Priority: Medium    History of renal artery stenosis 05/18/2011    Priority: Medium    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/26/2021    Priority: Low   Dyshidrotic eczema 11/10/2017    Priority: Low   Osteopenia 11/10/2017    Priority: Low   Does use hearing aid 11/10/2017    Priority: Low   Oxygen  dependent 04/29/2023   Centrilobular emphysema (HCC) 02/17/2023   Chronic respiratory failure (HCC) 02/17/2023   Pneumonia 02/17/2023   Senile purpura (HCC) 12/22/2022   Daily consumption of alcohol 12/22/2022   S/P lumbar laminectomy 03/24/2022   Thyroid  nodule greater than or equal to 1.5 cm in diameter incidentally noted on imaging study 08/27/2021   Angina pectoris (HCC) 06/25/2021   Current Meds  Medication Sig   Accu-Chek FastClix Lancets MISC Check blood sugar once daily PRN   albuterol  (PROAIR  HFA) 108 (90 Base) MCG/ACT inhaler Inhale 2 puffs into the lungs every 6 (six) hours as needed for wheezing or shortness of breath.   albuterol  (PROVENTIL ) (2.5 MG/3ML) 0.083% nebulizer solution Take 3 mLs (2.5 mg total) by nebulization every 6 (six) hours as needed for wheezing or shortness of breath.   ALPRAZolam  (XANAX ) 0.25 MG tablet Take 1 tablet (0.25 mg total) by mouth at bedtime as  needed for sleep.   amLODipine  (NORVASC ) 5 MG tablet Take 1/2 tablet ( 2.5 mg ) daily   aspirin  EC 81 MG tablet Take 1 tablet (81 mg total) by mouth daily.   atorvastatin  (LIPITOR ) 80 MG tablet TAKE 1 TABLET BY MOUTH EVERY DAY   azithromycin  (ZITHROMAX ) 250 MG tablet Take 1 tablet (250 mg total) by mouth 3 (three) times a week. Monday Wednesday Friday   B Complex-Biotin-FA (B-100 COMPLEX PO) Take 1 tablet by mouth every evening.   bismuth subsalicylate (PEPTO BISMOL) 262 MG/15ML suspension Take 30 mLs by mouth every 6 (six) hours as needed for indigestion or diarrhea or loose stools.   Budeson-Glycopyrrol-Formoterol  (BREZTRI  AEROSPHERE) 160-9-4.8 MCG/ACT AERO Inhale 2 puffs into the lungs in the morning and at bedtime.   cetirizine (ZYRTEC) 10 MG tablet Take 10 mg by mouth daily as needed for allergies.   cholecalciferol  (VITAMIN D3) 25  MCG (1000 UNIT) tablet Take 1,000 Units by mouth every evening.   CORDRAN  4 MCG/SQCM TAPE Apply 1 each topically daily as needed (Rash).    cycloSPORINE  (RESTASIS ) 0.05 % ophthalmic emulsion Place 1 drop into both eyes 2 (two) times daily.    ezetimibe  (ZETIA ) 10 MG tablet TAKE 1 TABLET BY MOUTH EVERY DAY   FLUZONE HIGH-DOSE 0.5 ML injection    gabapentin  (NEURONTIN ) 300 MG capsule Take 1 capsule (300 mg total) by mouth 2 (two) times daily.   glucose blood (ACCU-CHEK SMARTVIEW) test strip CHECK BLOOD SUGAR ONCE DAILY AS NEEDED   HYDROcodone  bit-homatropine (HYCODAN) 5-1.5 MG/5ML syrup Take 5 mLs by mouth every 6 (six) hours as needed for cough.   isosorbide  mononitrate (IMDUR ) 30 MG 24 hr tablet TAKE 3 TABLETS ( 90 MG ) EVERY MORNING AND 1 TABLET (30 MG ) EVERY EVENING   losartan  (COZAAR ) 100 MG tablet TAKE 1 TABLET BY MOUTH EVERY DAY   metoprolol  succinate (TOPROL -XL) 25 MG 24 hr tablet TAKE 1 TABLET (25 MG TOTAL) BY MOUTH DAILY.   Multiple Vitamins-Minerals (MULTIVITAMIN WITH MINERALS) tablet Take 1 tablet by mouth every evening.   nitroGLYCERIN  (NITROSTAT ) 0.4  MG SL tablet Place 1 tablet (0.4 mg total) under the tongue every 5 (five) minutes as needed for chest pain.   omeprazole  (PRILOSEC) 40 MG capsule Take 1 capsule (40 mg total) by mouth daily.   ondansetron  (ZOFRAN ) 8 MG tablet Take 1 tablet (8 mg total) by mouth every 6 (six) hours as needed for nausea or vomiting.   oxyCODONE -acetaminophen  (PERCOCET) 10-325 MG tablet Take 1 tablet by mouth in the morning and at bedtime. Can take one tablet three times a day as needed for back pain   polyethylene glycol (MIRALAX  / GLYCOLAX ) 17 g packet Take 17 g by mouth daily as needed for moderate constipation.   prasugrel  (EFFIENT ) 5 MG TABS tablet TAKE 1 TABLET (5 MG TOTAL) BY MOUTH DAILY.   PRESCRIPTION MEDICATION Place 2 puffs into both ears daily as needed (ear infections). [facility]SA[facility] ear powder   Simethicone  (GAS-X PO) Take 1-2 tablets by mouth daily as needed (gas).   SPIKEVAX syringe    torsemide  (DEMADEX ) 20 MG tablet Take 1 tablet (20 mg total) by mouth daily. Take 1/2 tablet daily as needed for swelling   tretinoin  (RETIN-A ) 0.1 % cream Apply 1 application. topically every evening.    Allergies: Patient is allergic to brilinta  [ticagrelor ], bextra [valdecoxib], hydrocod poli-chlorphe poli er, lipitor  [atorvastatin ], mevacor [lovastatin], plavix  [clopidogrel  bisulfate], zocor [simvastatin], doxycycline , metoprolol , morphine  and codeine , codeine , and lisinopril. Family History: Patient family history includes AAA (abdominal aortic aneurysm) in her brother; Breast cancer in her sister; Colon cancer in her cousin and paternal grandmother; Diabetes in her brother and sister; Heart attack in her brother, father, mother, and sister; Heart disease in her brother, brother, father, mother, and sister; Hyperlipidemia in her brother and sister; Hypertension in her brother and sister; Liver cancer in her sister. Social History:  Patient  reports that she quit smoking about 27 years ago. Her smoking use included  cigarettes. She has never been exposed to tobacco smoke. She has never used smokeless tobacco. She reports that she does not currently use alcohol after a past usage of about 6.0 - 10.0 standard drinks of alcohol per week. She reports that she does not use drugs.  Review of Systems: Constitutional: Negative for fever malaise or anorexia Cardiovascular: negative for chest pain Respiratory: negative for SOB or persistent cough Gastrointestinal: negative  for abdominal pain  Objective  Vitals: BP 131/66   Pulse 66   Temp 97.9 F (36.6 C)   Ht 4' 11.5" (1.511 m)   Wt 134 lb 12.8 oz (61.1 kg)   SpO2 93%   BMI 26.77 kg/m  General: no acute distress , A&Ox3 HEENT: PEERL, conjunctiva normal, neck is supple Skin:  Warm, no rashes Upper central back with approx 3cm mobile nontender lipomatous mass  Commons side effects, risks, benefits, and alternatives for medications and treatment plan prescribed today were discussed, and the patient expressed understanding of the given instructions. Patient is instructed to call or message via MyChart if he/she has any questions or concerns regarding our treatment plan. No barriers to understanding were identified. We discussed Red Flag symptoms and signs in detail. Patient expressed understanding regarding what to do in case of urgent or emergency type symptoms.  Medication list was reconciled, printed and provided to the patient in AVS. Patient instructions and summary information was reviewed with the patient as documented in the AVS. This note was prepared with assistance of Dragon voice recognition software. Occasional wrong-word or sound-a-like substitutions may have occurred due to the inherent limitations of voice recognition software

## 2024-04-12 ENCOUNTER — Telehealth: Payer: Self-pay | Admitting: Diagnostic Neuroimaging

## 2024-04-12 ENCOUNTER — Other Ambulatory Visit: Payer: Self-pay | Admitting: Diagnostic Neuroimaging

## 2024-04-12 NOTE — Telephone Encounter (Signed)
 Pt Called requesting a refill for gabapentin  (NEURONTIN ) 300 MG capsule. Pt request Medication sent to [id number] - [place], [place] - [address]

## 2024-04-13 MED ORDER — GABAPENTIN 300 MG PO CAPS: 300.0000 mg | ORAL_CAPSULE | Freq: Two times a day (BID) | ORAL | 3 refills | Status: AC

## 2024-04-13 NOTE — Telephone Encounter (Signed)
 Refilled

## 2024-04-17 DIAGNOSIS — M48061 Spinal stenosis, lumbar region without neurogenic claudication: Secondary | ICD-10-CM | POA: Diagnosis not present

## 2024-04-18 ENCOUNTER — Other Ambulatory Visit: Payer: Self-pay | Admitting: Cardiology

## 2024-05-23 ENCOUNTER — Other Ambulatory Visit: Payer: Self-pay | Admitting: Family Medicine

## 2024-05-30 ENCOUNTER — Other Ambulatory Visit: Payer: Self-pay

## 2024-05-30 MED ORDER — BREZTRI AEROSPHERE 160-9-4.8 MCG/ACT IN AERO: 2.0000 | INHALATION_SPRAY | Freq: Two times a day (BID) | RESPIRATORY_TRACT | 3 refills | Status: AC

## 2024-06-06 DIAGNOSIS — Z01818 Encounter for other preprocedural examination: Secondary | ICD-10-CM | POA: Diagnosis not present

## 2024-06-06 DIAGNOSIS — H02832 Dermatochalasis of right lower eyelid: Secondary | ICD-10-CM | POA: Diagnosis not present

## 2024-06-06 DIAGNOSIS — H57813 Brow ptosis, bilateral: Secondary | ICD-10-CM | POA: Diagnosis not present

## 2024-06-06 DIAGNOSIS — H0279 Other degenerative disorders of eyelid and periocular area: Secondary | ICD-10-CM | POA: Diagnosis not present

## 2024-06-06 DIAGNOSIS — H02423 Myogenic ptosis of bilateral eyelids: Secondary | ICD-10-CM | POA: Diagnosis not present

## 2024-06-06 DIAGNOSIS — H02835 Dermatochalasis of left lower eyelid: Secondary | ICD-10-CM | POA: Diagnosis not present

## 2024-06-25 ENCOUNTER — Telehealth: Payer: Self-pay | Admitting: Family Medicine

## 2024-06-25 ENCOUNTER — Other Ambulatory Visit

## 2024-06-25 NOTE — Telephone Encounter (Signed)
 Pt was scheduled for lab appt prior to CPE by E2C2. No orders were in. Provider is out this week. Informed pt. Patient will have them done during CPE.

## 2024-06-27 DIAGNOSIS — M7061 Trochanteric bursitis, right hip: Secondary | ICD-10-CM | POA: Diagnosis not present

## 2024-06-27 DIAGNOSIS — M7062 Trochanteric bursitis, left hip: Secondary | ICD-10-CM | POA: Diagnosis not present

## 2024-06-28 ENCOUNTER — Encounter: Admitting: Family Medicine

## 2024-07-02 ENCOUNTER — Ambulatory Visit (INDEPENDENT_AMBULATORY_CARE_PROVIDER_SITE_OTHER): Admitting: Family Medicine

## 2024-07-02 ENCOUNTER — Encounter: Payer: Self-pay | Admitting: Family Medicine

## 2024-07-02 VITALS — BP 134/66 | HR 63 | Temp 97.7°F | Ht 59.5 in | Wt 132.0 lb

## 2024-07-02 DIAGNOSIS — K760 Fatty (change of) liver, not elsewhere classified: Secondary | ICD-10-CM

## 2024-07-02 DIAGNOSIS — I10 Essential (primary) hypertension: Secondary | ICD-10-CM | POA: Diagnosis not present

## 2024-07-02 DIAGNOSIS — E785 Hyperlipidemia, unspecified: Secondary | ICD-10-CM | POA: Diagnosis not present

## 2024-07-02 DIAGNOSIS — J4489 Other specified chronic obstructive pulmonary disease: Secondary | ICD-10-CM

## 2024-07-02 DIAGNOSIS — I25708 Atherosclerosis of coronary artery bypass graft(s), unspecified, with other forms of angina pectoris: Secondary | ICD-10-CM

## 2024-07-02 DIAGNOSIS — Z0001 Encounter for general adult medical examination with abnormal findings: Secondary | ICD-10-CM | POA: Diagnosis not present

## 2024-07-02 DIAGNOSIS — M48061 Spinal stenosis, lumbar region without neurogenic claudication: Secondary | ICD-10-CM

## 2024-07-02 DIAGNOSIS — D692 Other nonthrombocytopenic purpura: Secondary | ICD-10-CM

## 2024-07-02 DIAGNOSIS — R7301 Impaired fasting glucose: Secondary | ICD-10-CM

## 2024-07-02 DIAGNOSIS — I739 Peripheral vascular disease, unspecified: Secondary | ICD-10-CM

## 2024-07-02 NOTE — Progress Notes (Signed)
 Subjective  Chief Complaint  Patient presents with   Annual Exam   Hypertension    HPI: Megan [name] is a 78 y.o. female who presents to [facility] [place] today for a Female Wellness Visit. She also has the concerns and/or needs as listed above in the chief complaint. These will be addressed in addition to the Health Maintenance Visit.   Wellness Visit: annual visit with health maintenance review and exam  HM: screens are current. Will be due for last colonoscopy this year due to h/o tubular adenoma Her husband passed 12 weeks ago (dementia). She is feeling relieved overall.  Imms current Chronic disease f/u and/or acute problem visit: (deemed necessary to be done in addition to the wellness visit): Chronic medical problems are managed by cardiology and pulmonology.  COPD is her main limitation, oxygen  requiring at times.  Humidity has worsened her breathing recently. Chronic angina is stable. Hyperlipidemia on medications: Last lipid and LFTs were at goal 3 months ago. Impaired fasting glucose remained stable with last check in March. Liver test are normal. Assessment  1. Encounter for well adult exam with abnormal findings   2. Coronary artery disease of bypass graft of native heart with stable angina pectoris (HCC)   3. Essential (primary) hypertension   4. Hyperlipidemia LDL goal <70   5. IFG (impaired fasting glucose)   6. Peripheral vascular disease with claudication (HCC)   7. COPD with chronic bronchitis (HCC)   8. Fatty liver   9. Lumbar stenosis without neurogenic claudication   10. Senile purpura [facility])      Plan  Female Wellness Visit: Age appropriate Health Maintenance and Prevention measures were discussed with patient. Included topics are cancer screening recommendations, ways to keep healthy (see AVS) including dietary and exercise recommendations, regular eye and dental care, use of seat belts, and avoidance of moderate alcohol use and  tobacco use.  BMI: discussed patient's BMI and encouraged positive lifestyle modifications to help get to or maintain a target BMI. HM needs and immunizations were addressed and ordered. See below for orders. See HM and immunization section for updates. Routine labs and screening tests ordered including cmp, cbc and lipids where appropriate. Discussed recommendations regarding Vit D and calcium  supplementation (see AVS)  Chronic disease management visit and/or acute problem visit: Chronic angina and heart disease per cardiology.  Continues close follow-up.  Medical management only blood pressure is controlled Lipids are at goal COPD mild flare.  Will monitor.  Continue inhalers and oxygen  as needed Reviewed lab work with her.  No labs ordered today  Follow up: 1 year for physical No orders of the defined types were placed in this encounter.  No orders of the defined types were placed in this encounter.     Body mass index is 26.21 kg/m. 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 Aneita    Coronary  artery disease     Priority: High    a. s/p CABG in 1995;  b. 05/2013 Neg MV, EF 82%;  c. 06/2013 Cath: LM nl, LAD 40-50p, LCX nl, RCA 80p/141m, VG->RCA->PDA->PLSA min irregs, LIMA->LAD atretic, vigorous LV fxn.    Peripheral vascular disease with claudication (HCC)     Priority: High   History of abdominal aortic aneurysm repair 05/18/2011    Priority: High    Stent graft 2009      Hyperlipidemia LDL goal <70     Priority: High   History of compression fracture of spine - lumbar 06/19/2021    Priority: Medium    History of lumbosacral spine surgery 03/03/2021    Priority: Medium    Neuropathic pain 12/05/2020    Priority: Medium    Gait instability 09/09/2020    Priority: Medium    Lumbar stenosis without neurogenic claudication 02/26/2020    Priority: Medium     Gait difficulty likely related to hip flexor weakness associated with L2-3 spinal stenosis. Neurology 12/2023    DJD (degenerative joint disease), lumbosacral 11/10/2017    Priority: Medium    Fibromyalgia 11/10/2017    Priority: Medium    GERD (gastroesophageal reflux disease) 11/10/2017    Priority: Medium    Fatty liver 11/10/2017    Priority: Medium    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    Priority: Medium     Severe by colonoscopy 09/2014, Dr. Aneita    History of renal artery stenosis 05/18/2011    Priority: Medium     Right renal artery stent 2008    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/26/2021    Priority: Low   Dyshidrotic eczema 11/10/2017    Priority: Low   Osteopenia 11/10/2017    Priority: Low    DEXA 10/2019, T = -1.3 lowest, osteopenia. Recheck 2-3 years.  Last Dexa 2009; declines further testing; does not tolerate biphosphanates    Does use hearing aid 11/10/2017    Priority: Low   Oxygen  dependent 04/29/2023   Centrilobular emphysema (HCC) 02/17/2023   Chronic respiratory failure (HCC) 02/17/2023   Pneumonia 02/17/2023   Senile purpura (HCC) 12/22/2022   Daily consumption of alcohol 12/22/2022   S/P lumbar laminectomy 03/24/2022   Thyroid  nodule greater than or equal to 1.5 cm in diameter incidentally noted on imaging study 08/27/2021    FNA 11/2021 endocrine    Angina pectoris (HCC) 06/25/2021   Health Maintenance  Topic Date Due   Medicare Annual Wellness (AWV)  06/13/2021   COVID-19 Vaccine (6 - Pfizer risk 2024-25 season) 07/18/2024 (Originally  02/10/2024)   Diabetic kidney evaluation - Urine ACR  12/23/2027 (Originally 08/26/1964)   INFLUENZA VACCINE  07/06/2024   Colonoscopy  11/04/2024   MAMMOGRAM  01/12/2025   Diabetic kidney evaluation - eGFR measurement  02/08/2025   Pneumococcal Vaccine: 50+ Years  Completed   Hepatitis C Screening  Completed   Zoster Vaccines- Shingrix  Completed   Hepatitis B Vaccines  Aged Out   HPV VACCINES  Aged Out   Meningococcal B Vaccine  Aged Out   DTaP/Tdap/Td  Discontinued   Immunization History  Administered Date(s) Administered   Fluad Quad(high Dose 65+) 07/30/2019, 08/10/2022   Influenza, High Dose Seasonal PF 08/13/2017, 08/26/2018, 09/15/2020   Influenza, Seasonal, Injecte, Preservative Fre 09/21/2014, 09/06/2015, 07/21/2016   Influenza,inj,quad, With Preservative 08/06/2017   Influenza-Unspecified 08/10/2017, 10/13/2021   Moderna Covid-19 Fall Seasonal Vaccine 65yrs & older 08/13/2023   PFIZER(Purple Top)SARS-COV-2 Vaccination 12/22/2019, 01/12/2020, 09/15/2020   PPD  Test 09/13/1979, 03/10/2021   Pfizer Covid-19 Vaccine Bivalent Booster 28yrs & up 10/13/2021   Pneumococcal Conjugate-13 12/31/2016   Pneumococcal Polysaccharide-23 12/16/2014, 08/01/2019   Respiratory Syncytial Virus Vaccine,Recomb Aduvanted(Arexvy) 10/06/2022   Td 05/13/2008   Td (Adult), 2 Lf Tetanus Toxid, Preservative Free 05/13/2008   Tdap 08/09/2012   Zoster Recombinant(Shingrix) 01/13/2018, 03/14/2018   Zoster, Live 11/03/2010   We updated and reviewed the patient's past history in detail and it is documented below. Allergies: Patient is allergic to brilinta  [ticagrelor ], bextra [valdecoxib], hydrocod poli-chlorphe poli er, lipitor  [atorvastatin ], mevacor [lovastatin], plavix  [clopidogrel  bisulfate], zocor [simvastatin], doxycycline , metoprolol , morphine  and codeine , codeine , and lisinopril. 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 Past Surgical History Patient  has a past surgical history that includes Renal artery stent (2008); Coronary artery bypass graft (07/1994); Carpal tunnel release (08/1993); Tonsilectomy, adenoidectomy, bilateral myringotomy and tubes (1989); Total abdominal hysterectomy (1992); Carpal tunnel release (01/1997); thyroid  cyst apiration (05/1997 and 07/1998); Cardiovascular stress test (11/11/2009); Cardiac catheterization; Abdominal aortic aneurysm repair (2009); left heart catheterization with coronary/graft angiogram (N/A, 06/11/2013); abdominal aortagram (N/A, 03/06/2014); Hand surgery (Right, 09/2002); Multiple tooth extractions (2000); Abdominal hysterectomy; dental implants; Breast biopsy; Shoulder arthroscopy with rotator cuff repair (Right, 09/27/2018); Cataract extraction, bilateral (2021); Tonsillectomy; Eye surgery (03/2020); LEFT HEART CATH AND CORS/GRAFTS ANGIOGRAPHY (N/A, 02/02/2021); CORONARY STENT INTERVENTION (N/A, 02/02/2021); LEFT HEART CATH AND CORONARY ANGIOGRAPHY (N/A, 06/26/2021); Coronary Ultrasound/IVUS (N/A, 06/26/2021); CORONARY STENT INTERVENTION (N/A, 06/26/2021); LEFT HEART CATH AND CORS/GRAFTS ANGIOGRAPHY (N/A, 09/03/2021); Coronary Ultrasound/IVUS (N/A, 09/03/2021); Laminectomy with posterior lateral arthrodesis level 1 (N/A, 03/24/2022); and LEFT HEART CATH AND CORS/GRAFTS ANGIOGRAPHY (N/A, 07/15/2022). Family History: Patient family history includes AAA (abdominal aortic aneurysm) in her  brother; Breast cancer in her sister; Colon cancer in her cousin and paternal grandmother; Diabetes in her brother and sister; Heart attack in her brother, father, mother, and sister; Heart disease in her brother, brother, father, mother, and sister; Hyperlipidemia in her brother and sister; Hypertension in her brother and sister; Liver cancer in her sister. Social History:  Patient  reports that she quit smoking about 27 years ago. Her smoking use included cigarettes. She has never been exposed to tobacco smoke. She has never used smokeless tobacco. She reports that she does not currently use alcohol after a past usage of about 6.0 - 10.0 standard drinks of alcohol per week. She reports that she does not use drugs.  Review of Systems: Constitutional: negative for fever or malaise Ophthalmic: negative for photophobia, double vision or loss of vision Cardiovascular: negative for chest pain, dyspnea on exertion, or new LE swelling Respiratory: negative for SOB or persistent cough Gastrointestinal: negative for abdominal pain, change in bowel habits or melena Genitourinary: negative for dysuria or gross hematuria, no abnormal uterine bleeding or disharge Musculoskeletal: negative for new gait disturbance or muscular weakness Integumentary: negative for new or persistent rashes, no breast lumps Neurological: negative for TIA or stroke symptoms Psychiatric: negative for SI or delusions Allergic/Immunologic: negative for hives  Patient Care Team    Relationship Specialty Notifications Start End  Jodie Lavern CROME, MD PCP - General Family Medicine  11/08/23   [place], Peter M, MD PCP - Cardiology Cardiology Admissions 05/08/20   [place], Peter M, MD Consulting Physician Cardiology  05/24/17   Thaddeus Locus, MD Consulting Physician Otolaryngology  11/10/17   Aneita Gwendlyn DASEN, MD (Inactive) Consulting Physician Gastroenterology  11/10/17   Margaret Eduard SAUNDERS, MD Consulting Physician Neurology  11/10/17   Nichole Senior, MD Consulting Physician Endocrinology  11/10/17   Robinson Idol, MD Consulting Physician Ophthalmology  11/10/17   Zackary Rush, DMD Consulting Physician Dentistry  11/10/17   Cary Doffing, MD Consulting Physician Dermatology  01/12/18   Kemp, Emerge  Specialist  01/12/18   Georgina  Dentistry  01/25/19   Tonette Meyer Meade Domino, PA-C Physician Assistant Neurosurgery  04/28/23   Hunsucker, Donnice SAUNDERS, MD Consulting Physician Pulmonary Disease  11/08/23     Objective  Vitals: BP 134/66   Pulse 63   Temp 97.7 F (36.5 C)   Ht 4' 11.5 (1.511 m)   Wt 132 lb (59.9 kg)   SpO2 92%   BMI 26.21 kg/m  General:  Well developed, well nourished, no acute distress, no respiratory distress Psych:  Alert and orientedx3,normal mood and affect HEENT:  Normocephalic, atraumatic, non-icteric sclera,  supple neck without adenopathy, mass or thyromegaly Cardiovascular:  Normal S1, S2, RRR without gallop, rub or murmur, +1 pedal pulses, no edema Respiratory:  Good breath sounds bilaterally, CTAB with normal respiratory effort Gastrointestinal: normal bowel sounds, soft, non-tender, no noted masses. No HSM MSK: extremities without edema, joints without erythema or swelling Neurologic:    Mental status is normal.  Gross motor and sensory exams are normal.  No tremor  Commons side effects, risks, benefits, and alternatives for medications and treatment plan prescribed today were discussed, and the patient expressed understanding of the given instructions. Patient is instructed to call or message via MyChart if he/she has any questions or concerns regarding our treatment plan. No barriers to understanding were identified. We discussed Red Flag symptoms and signs in detail. Patient expressed understanding regarding what to do in case of urgent or emergency type symptoms.  Medication list was reconciled, printed and provided to the patient in AVS. Patient instructions and summary information was reviewed with  the patient as documented in the AVS. This note was prepared with assistance of Dragon voice recognition software. Occasional wrong-word or sound-a-like substitutions may have occurred due to the inherent limitations of voice recognition software

## 2024-07-02 NOTE — Patient Instructions (Signed)
 Please return in 12 months for your annual complete physical; please come fasting.  ? ?If you have any questions or concerns, please don't hesitate to send me a message via MyChart or call the office at [phone]. Thank you for visiting with [place] today! It's our pleasure caring for you.  ?

## 2024-07-16 ENCOUNTER — Other Ambulatory Visit: Payer: Self-pay | Admitting: Cardiology

## 2024-07-16 ENCOUNTER — Telehealth: Payer: Self-pay

## 2024-07-16 DIAGNOSIS — H02835 Dermatochalasis of left lower eyelid: Secondary | ICD-10-CM | POA: Diagnosis not present

## 2024-07-16 DIAGNOSIS — H57813 Brow ptosis, bilateral: Secondary | ICD-10-CM | POA: Diagnosis not present

## 2024-07-16 DIAGNOSIS — H02423 Myogenic ptosis of bilateral eyelids: Secondary | ICD-10-CM | POA: Diagnosis not present

## 2024-07-16 DIAGNOSIS — H02832 Dermatochalasis of right lower eyelid: Secondary | ICD-10-CM | POA: Diagnosis not present

## 2024-07-16 NOTE — Telephone Encounter (Signed)
   Pre-operative Risk Assessment    Patient Name: ALEETA SCHMALTZ  DOB: [birth year] MRN: [id number]   Date of last office visit: 02/13/24 PETER SWAZILAND, MD Date of next office visit: 07/30/24 PETER SWAZILAND, MD   Request for Surgical Clearance    Procedure:  BILATERAL LOWER EYELID BIEPHAROPLASTY  Date of Surgery:  Clearance 07/30/24                                Surgeon:  DR OSBORNE PONDER Surgeon's Group or Practice Name:  [facility] Phone number:  [phone] Fax number:  [phone]   Type of Clearance Requested:   - Medical  - Pharmacy:  Hold Aspirin  and Prasugrel  (Effient )     Type of Anesthesia:  MOBLIE   Additional requests/questions:    SignedLucie DELENA Ku   07/16/2024, 5:18 PM

## 2024-07-17 ENCOUNTER — Telehealth (HOSPITAL_BASED_OUTPATIENT_CLINIC_OR_DEPARTMENT_OTHER): Payer: Self-pay

## 2024-07-17 NOTE — Telephone Encounter (Signed)
 Preop tele appt now scheduled, med rec and consent done.

## 2024-07-17 NOTE — Telephone Encounter (Signed)
  Patient Consent for Virtual Visit        Megan [name] has provided verbal consent on 07/17/2024 for a virtual visit (video or telephone).   CONSENT FOR VIRTUAL VISIT FOR:  Megan [name]  By participating in this virtual visit I agree to the following:  I hereby voluntarily request, consent and authorize [facility] and its employed or contracted physicians, physician assistants, nurse practitioners or other licensed health care professionals (the Practitioner), to provide me with telemedicine health care services (the "Services) as deemed necessary by the treating Practitioner. I acknowledge and consent to receive the Services by the Practitioner via telemedicine. I understand that the telemedicine visit will involve communicating with the Practitioner through live audiovisual communication technology and the disclosure of certain medical information by electronic transmission. I acknowledge that I have been given the opportunity to request an in-person assessment or other available alternative prior to the telemedicine visit and am voluntarily participating in the telemedicine visit.  I understand that I have the right to withhold or withdraw my consent to the use of telemedicine in the course of my care at any time, without affecting my right to future care or treatment, and that the Practitioner or I may terminate the telemedicine visit at any time. I understand that I have the right to inspect all information obtained and/or recorded in the course of the telemedicine visit and may receive copies of available information for a reasonable fee.  I understand that some of the potential risks of receiving the Services via telemedicine include:  Delay or interruption in medical evaluation due to technological equipment failure or disruption; Information transmitted may not be sufficient (e.g. poor resolution of images) to allow for appropriate medical decision making by the Practitioner;  and/or  In rare instances, security protocols could fail, causing a breach of personal health information.  Furthermore, I acknowledge that it is my responsibility to provide information about my medical history, conditions and care that is complete and accurate to the best of my ability. I acknowledge that Practitioner's advice, recommendations, and/or decision may be based on factors not within their control, such as incomplete or inaccurate data provided by me or distortions of diagnostic images or specimens that may result from electronic transmissions. I understand that the practice of medicine is not an exact science and that Practitioner makes no warranties or guarantees regarding treatment outcomes. I acknowledge that a copy of this consent can be made available to me via my patient portal [facility] MyChart), or I can request a printed copy by calling the office of [facility].    I understand that my insurance will be billed for this visit.   I have read or had this consent read to me. I understand the contents of this consent, which adequately explains the benefits and risks of the Services being provided via telemedicine.  I have been provided ample opportunity to ask questions regarding this consent and the Services and have had my questions answered to my satisfaction. I give my informed consent for the services to be provided through the use of telemedicine in my medical care

## 2024-07-17 NOTE — Telephone Encounter (Signed)
   Name: Megan [name]  DOB: February 15, 1946  MRN: [id number]  Primary Cardiologist: Peter [place], MD Last OV 02/13/24  Preoperative team, please contact this patient and set up a phone call appointment for further preoperative risk assessment. Please obtain consent and complete medication review. Thank you for your help.  I confirm that guidance regarding antiplatelet and oral anticoagulation therapy has been completed and, if necessary, noted below.  Per Dr. [place], I am OK with holding Effient  but should remain on ASA 81 mg daily. Patient may hold Effient  for 7 days prior to procedure and resume as soon as safe to do so from a bleeding standpoint. She should remain on ASA 81 mg daily throughout the perioperative period.   I also confirmed the patient resides in the state of [place] . As per [facility] Medical Board telemedicine laws, the patient must reside in the state in which the provider is licensed.   Vander Kueker D Merrianne Mccumbers, NP 07/17/2024, 10:57 AM [facility]

## 2024-07-17 NOTE — Progress Notes (Signed)
 Cardiology Office Note   Date:  07/30/2024   ID:  Sola, Margolis [birth year], MRN [id number]  PCP:  Jodie Lavern CROME, MD  Cardiologist: Kareen Hitsman [place] MD  Chief Complaint  Patient presents with   Shortness of Breath   Coronary Artery Disease    History of Present Illness: Megan [name] is a 78 y.o. female who is seen for follow up CAD.  She has a history  of CAD, HTN, HL, and PAD.   She is status post coronary artery bypass graft surgery in 1995- Dr Army. She had a renal artery stent for renal artery stenosis in 2008. She had a stent graft of an abdominal aortic aneurysm 2009. She is s/p stenting of the left iliac. She is followed by [facility] for PAD. Last CT in May 2018. She had US  in June 2019 showing no endoleak.    She was hospitalized in July 2014 and had cardiac catheterization which showed that her grafts were open. EF normal.  She was hospitalized 11/15/2014 with which he describes as an aching sensation in her chest.    She underwent a treadmill Myoview  on 11/17/14 which showed no ischemia and her ejection fraction was 86%.   In June 2019 she had Abdominal US  at [facility] showing AAA of 4 cm.   She was in the hospital  01/05/21 and discharged on 2/8- for AKI, diarrhea- diverticulitis (sigmoid colon), metabolic encephalopathy, UTI. Echo at that time was OK with normal EF and moderate TR.   She was re- admitted 2/26-02/03/21 with NSTEMI. Troponin up to 1085. Ecg showed significant inferolateral TWA. She underwent cardiac cath demonstrating patent LIMA to the LAD. The native RCA was occluded and she had severe sequential lesions in the SVG to RCA. This was treated with overlapping DES x 2- 3.0 x 26 and 3.5 x 34 mm Onyx stents.  Procedure was complicated by a left forearm hematoma.   On prior visit she complained of increased SOB- this was felt to be related to Brilinta . We switched to Plavix  but she had an allergic reaction with rash and hives. Later switched to Prasugrel - reduced dose  based on age and female.    She presented to the ED on 06/25/21 with complaints of chest pain over the past 2 days. She underwent repeat cardiac cath demonstrating restenosis of the SVG to RCA with clear evidence of prior stent fracture. She underwent repeat PCI with 3.5 x 26 mm Onyx stent post dilated to 4.0 with Advance balloon using IVUS guidance. Of note there were no collaterals to the distal RCA. There was significant aortic calcification.   She was seen in the ED on 08/27/21 with recurrent chest pain. She underwent repeat cardiac cath which showed the stent was patent.   Was seen by Reche Finder PA on Jully 25.  Noted symptoms of dyspnea. was seen by her PCP. Noted her BP was a little high and Losartan  dose was increased to 100mg  daily. The next day started having cold chills and shaking. Felt she was coming down with a cold. Noted body aches, temperature was 23 F which is much higher than her baseline. Cough with sputum production. COVID tests x3 were negative. Later developed  sporadic shortness of breath associated with chest aching. She continued to have symptoms of atypical chest pain and some dyspnea so we proceeded with repeat cardiac cath in August 2023. This showed patent grafts and no new disease.  She has had progressive SOB. Seen by pulmonary  and now on home oxygen . D dimer elevated but CT showed no PE. +emphysema. No cough. Minor swelling. Pro BNP mildly elevated 145. CXR without edema. We performed Echo which was normal.   She has since been followed by Dr Annella with pulmonary. Has advance emphysema. On oxygen  during day.  In pulmonary rehab.   On follow up today she has completed Rehab. Notes some increased SOB recently and given prednisone  dose pack by Dr Annella.  Last night walking back from restaurant had chest pressure and some left arm discomfort. BP was high. Took sl Ntg with relief.    Past Medical History:  Diagnosis Date   AAA (abdominal aortic aneurysm) (HCC)    a.  s/p stent grafting in 2009.   Adenomatous colon polyp 05/2001   Adenomatous duodenal polyp    Allergy    Anginal pain (HCC)    COPD (chronic obstructive pulmonary disease) (HCC)    pt denies COPD   Coronary artery disease    a. s/p CABG in 1995;  b. 05/2013 Neg MV, EF 82%;  c. 06/2013 Cath: LM nl, LAD 40-50p, LCX nl, RCA 80p/137m, VG->RCA->PDA->PLSA min irregs, LIMA->LAD atretic, vigorous LV fxn.   Diabetes mellitus without complication (HCC)    history of, resolved. 2017   Diverticulosis    Eczema, dyshidrotic    Eczema, dyshidrotic 1986   Dr. Prentice Mayhew   Emphysema of lung [facility])    Fatty liver    Fibromyalgia    GERD (gastroesophageal reflux disease)    Hyperlipidemia    Hypertension    Migraine    Myocardial infarction [facility])    Osteoarthritis    Osteoporosis    unsure, having a bone scan soon   Peripheral arterial disease (HCC)    left leg diagnosed by Dr Dominick   Renal artery stenosis [facility])    a. 2008 s/p PTA    Past Surgical History:  Procedure Laterality Date   ABDOMINAL AORTAGRAM N/A 03/06/2014   Procedure: ABDOMINAL AORTAGRAM;  Surgeon: Krystal JULIANNA Doing, MD;  Location: [facility] CATH LAB;  Service: Cardiovascular;  Laterality: N/A;   ABDOMINAL AORTIC ANEURYSM REPAIR  2009   stenting   ABDOMINAL HYSTERECTOMY     Total, 1992   BREAST BIOPSY     CARDIAC CATHETERIZATION     CARDIOVASCULAR STRESS TEST  11/11/2009   normal study   CARPAL TUNNEL RELEASE  08/1993   left wrist   CARPAL TUNNEL RELEASE  01/1997   right   CATARACT EXTRACTION, BILATERAL  2021   CORONARY ARTERY BYPASS GRAFT  07/1994   Triple by Dr Katherene Jude   CORONARY STENT INTERVENTION N/A 02/02/2021   Procedure: CORONARY STENT INTERVENTION;  Surgeon: Mady Bruckner, MD;  Location: [facility] INVASIVE CV LAB;  Service: Cardiovascular;  Laterality: N/A;   CORONARY STENT INTERVENTION N/A 06/26/2021   Procedure: CORONARY STENT INTERVENTION;  Surgeon: Wonda Sharper, MD;  Location: [facility] INVASIVE CV LAB;  Service:  Cardiovascular;  Laterality: N/A;   CORONARY ULTRASOUND/IVUS N/A 06/26/2021   Procedure: Intravascular Ultrasound/IVUS;  Surgeon: Wonda Sharper, MD;  Location: [facility] INVASIVE CV LAB;  Service: Cardiovascular;  Laterality: N/A;   CORONARY ULTRASOUND/IVUS N/A 09/03/2021   Procedure: Intravascular Ultrasound/IVUS;  Surgeon: Wendel Lurena POUR, MD;  Location: [facility] INVASIVE CV LAB;  Service: Cardiovascular;  Laterality: N/A;   dental implants     EYE SURGERY  03/2020   cataract   HAND SURGERY Right 09/2002   Right hand pulley release   LAMINECTOMY WITH POSTERIOR LATERAL ARTHRODESIS LEVEL 1  N/A 03/24/2022   Procedure: LAMINECTOMY, FORAMINOTOMY, LUMBAR TWO- THREE ; INTERTRANSVERSE FUSION LUMBAR TWO - LUMBAR THREE RIGHT;  Surgeon: Joshua Alm RAMAN, MD;  Location: [facility] OR;  Service: Neurosurgery;  Laterality: N/A;   LEFT HEART CATH AND CORONARY ANGIOGRAPHY N/A 06/26/2021   Procedure: LEFT HEART CATH AND CORONARY ANGIOGRAPHY;  Surgeon: Wonda Sharper, MD;  Location: [facility] INVASIVE CV LAB;  Service: Cardiovascular;  Laterality: N/A;   LEFT HEART CATH AND CORS/GRAFTS ANGIOGRAPHY N/A 02/02/2021   Procedure: LEFT HEART CATH AND CORS/GRAFTS ANGIOGRAPHY;  Surgeon: Mady Bruckner, MD;  Location: [facility] INVASIVE CV LAB;  Service: Cardiovascular;  Laterality: N/A;   LEFT HEART CATH AND CORS/GRAFTS ANGIOGRAPHY N/A 09/03/2021   Procedure: LEFT HEART CATH AND CORS/GRAFTS ANGIOGRAPHY;  Surgeon: Wendel Lurena POUR, MD;  Location: [facility] INVASIVE CV LAB;  Service: Cardiovascular;  Laterality: N/A;   LEFT HEART CATH AND CORS/GRAFTS ANGIOGRAPHY N/A 07/15/2022   Procedure: LEFT HEART CATH AND CORS/GRAFTS ANGIOGRAPHY;  Surgeon: [place], Denielle Bayard M, MD;  Location: [facility] INVASIVE CV LAB;  Service: Cardiovascular;  Laterality: N/A;   LEFT HEART CATHETERIZATION WITH CORONARY/GRAFT ANGIOGRAM N/A 06/11/2013   Procedure: LEFT HEART CATHETERIZATION WITH EL BILE;  Surgeon: Aleene JINNY Passe, MD;  Location: [facility] CATH LAB;  Service: Cardiovascular;   Laterality: N/A;   MULTIPLE TOOTH EXTRACTIONS  2000   All upper teeth.  Has full dneture.    RENAL ARTERY STENT  2008   SHOULDER ARTHROSCOPY WITH ROTATOR CUFF REPAIR Right 09/27/2018   Procedure: Right shoulder mini open rotator cuff repair;  Surgeon: Duwayne Purchase, MD;  Location: [facility] ORS;  Service: Orthopedics;  Laterality: Right;  90 mins   thyroid  cyst apiration  05/1997 and 07/1998   TONSILECTOMY, ADENOIDECTOMY, BILATERAL MYRINGOTOMY AND TUBES  1989   TONSILLECTOMY     TOTAL ABDOMINAL HYSTERECTOMY  1992     Current Outpatient Medications  Medication Sig Dispense Refill   Accu-Chek FastClix Lancets MISC Check blood sugar once daily PRN 100 each 1   albuterol  (PROAIR  HFA) 108 (90 Base) MCG/ACT inhaler Inhale 2 puffs into the lungs every 6 (six) hours as needed for wheezing or shortness of breath. 1 each 11   albuterol  (PROVENTIL ) (2.5 MG/3ML) 0.083% nebulizer solution Take 3 mLs (2.5 mg total) by nebulization every 6 (six) hours as needed for wheezing or shortness of breath. 360 mL 5   amLODipine  (NORVASC ) 5 MG tablet Take 1/2 tablet ( 2.5 mg ) daily     aspirin  EC 81 MG tablet Take 1 tablet (81 mg total) by mouth daily. 90 tablet 3   atorvastatin  (LIPITOR ) 80 MG tablet TAKE 1 TABLET BY MOUTH EVERY DAY 90 tablet 3   azithromycin  (ZITHROMAX ) 250 MG tablet Take 1 tablet (250 mg total) by mouth 3 (three) times a week. Monday Wednesday Friday 60 tablet 2   B Complex-Biotin-FA (B-100 COMPLEX PO) Take 1 tablet by mouth every evening.     bismuth subsalicylate (PEPTO BISMOL) 262 MG/15ML suspension Take 30 mLs by mouth every 6 (six) hours as needed for indigestion or diarrhea or loose stools.     budesonide-glycopyrrolate -formoterol  (BREZTRI  AEROSPHERE) 160-9-4.8 MCG/ACT AERO inhaler Inhale 2 puffs into the lungs in the morning and at bedtime. 3 each 3   cholecalciferol  (VITAMIN D3) 25 MCG (1000 UNIT) tablet Take 1,000 Units by mouth every evening.     cycloSPORINE  (RESTASIS ) 0.05 % ophthalmic  emulsion Place 1 drop into both eyes 2 (two) times daily.      ezetimibe  (ZETIA ) 10 MG tablet TAKE 1 TABLET  BY MOUTH EVERY DAY 90 tablet 3   FLUZONE HIGH-DOSE 0.5 ML injection      gabapentin  (NEURONTIN ) 300 MG capsule Take 1 capsule (300 mg total) by mouth 2 (two) times daily. 270 capsule 3   glucose blood (ACCU-CHEK SMARTVIEW) test strip CHECK BLOOD SUGAR ONCE DAILY AS NEEDED 100 strip 1   isosorbide  mononitrate (IMDUR ) 30 MG 24 hr tablet TAKE 3 TABLETS ( 90 MG ) EVERY MORNING AND 1 TABLET (30 MG ) EVERY EVENING 360 tablet 2   losartan  (COZAAR ) 100 MG tablet TAKE 1 TABLET BY MOUTH EVERY DAY 90 tablet 3   metoprolol  succinate (TOPROL -XL) 25 MG 24 hr tablet TAKE 1 TABLET (25 MG TOTAL) BY MOUTH DAILY. 90 tablet 3   Multiple Vitamins-Minerals (MULTIVITAMIN WITH MINERALS) tablet Take 1 tablet by mouth every evening.     nitroGLYCERIN  (NITROSTAT ) 0.4 MG SL tablet Place 1 tablet (0.4 mg total) under the tongue every 5 (five) minutes as needed for chest pain. 25 tablet 11   omeprazole  (PRILOSEC) 40 MG capsule Take 1 capsule (40 mg total) by mouth daily. 90 capsule 3   oxyCODONE -acetaminophen  (PERCOCET) 10-325 MG tablet Take 1 tablet by mouth in the morning and at bedtime. Can take one tablet three times a day as needed for back pain     prasugrel  (EFFIENT ) 5 MG TABS tablet TAKE 1 TABLET (5 MG TOTAL) BY MOUTH DAILY. 90 tablet 2   predniSONE  (DELTASONE ) 20 MG tablet Take 2 tablets (40 mg total) by mouth daily with breakfast for 5 days. 10 tablet 0   Simethicone  (GAS-X PO) Take 1-2 tablets by mouth daily as needed (gas).     SPIKEVAX syringe      torsemide  (DEMADEX ) 20 MG tablet Take 1 tablet (20 mg total) by mouth daily. Take 1/2 tablet daily as needed for swelling 90 tablet 3   tretinoin  (RETIN-A ) 0.1 % cream Apply 1 application. topically every evening.     HYDROcodone  bit-homatropine (HYCODAN) 5-1.5 MG/5ML syrup Take 5 mLs by mouth every 6 (six) hours as needed for cough. (Patient not taking: Reported  on 07/30/2024) 240 mL 0   ondansetron  (ZOFRAN ) 8 MG tablet Take 1 tablet (8 mg total) by mouth every 6 (six) hours as needed for nausea or vomiting. (Patient not taking: Reported on 07/30/2024) 20 tablet 0   polyethylene glycol (MIRALAX  / GLYCOLAX ) 17 g packet Take 17 g by mouth daily as needed for moderate constipation. (Patient not taking: Reported on 07/30/2024)     PRESCRIPTION MEDICATION Place 2 puffs into both ears daily as needed (ear infections). [facility] ear powder (Patient not taking: Reported on 07/30/2024)     No current facility-administered medications for this visit.    Allergies:   Atorvastatin , Brilinta  [ticagrelor ], Bextra [valdecoxib], Hydrocod poli-chlorphe poli er, Mevacor [lovastatin], Plavix  [clopidogrel  bisulfate], Zocor [simvastatin], Doxycycline , Metoprolol , Morphine  and codeine , Codeine , and Lisinopril    Social History:  The patient  reports that she quit smoking about 27 years ago. Her smoking use included cigarettes. She has never been exposed to tobacco smoke. She has never used smokeless tobacco. She reports that she does not currently use alcohol after a past usage of about 6.0 - 10.0 standard drinks of alcohol per week. She reports that she does not use drugs.   Family History:  The patient's family history includes AAA (abdominal aortic aneurysm) in her brother; Breast cancer in her sister; Colon cancer in her cousin and paternal grandmother; Diabetes in her brother and sister; Heart attack in  her brother, father, mother, and sister; Heart disease in her brother, brother, father, mother, and sister; Hyperlipidemia in her brother and sister; Hypertension in her brother and sister; Liver cancer in her sister.    ROS:  Please see the history of present illness.   Otherwise, review of systems are positive for none.   All other systems are reviewed and negative.    PHYSICAL EXAM: VS:  BP (!) 122/42 (BP Location: Left Arm, Patient Position: Sitting, Cuff Size: Normal)    Pulse 73   Ht 4' 11 (1.499 m)   Wt 135 lb 9.6 oz (61.5 kg)   SpO2 97%   BMI 27.39 kg/m  , BMI Body mass index is 27.39 kg/m. GENERAL:   WF in NAD HEENT:  PERRL, EOMI, sclera are clear. Oropharynx is clear. NECK:  No jugular venous distention, carotid upstroke brisk and symmetric, no bruits, no thyromegaly or adenopathy LUNGS:  Clear to auscultation bilaterally CHEST:  Unremarkable HEART:  RRR,  PMI not displaced or sustained,S1 and S2 within normal limits, no S3, no S4: no clicks, no rubs, no murmurs ABD:  Soft, nontender. BS +, no masses or bruits. No hepatomegaly, no splenomegaly EXT:  1 + pulses throughout, no edema, no cyanosis no clubbing SKIN:  Warm and dry.  No rashes NEURO:  Alert and oriented x 3. Cranial nerves II through XII intact. PSYCH:  Cognitively intact     EKG Interpretation Date/Time:  Monday July 30 2024 11:11:15 EDT Ventricular Rate:  62 PR Interval:  156 QRS Duration:  102 QT Interval:  408 QTC Calculation: 414 R Axis:   -6  Text Interpretation: Normal sinus rhythm Minimal voltage criteria for LVH, may be normal variant ( R in aVL ) Inferior infarct (cited on or before 30-Jul-2024) When compared with ECG of 13-Feb-2024 10:52, No significant change was found Confirmed by [place], Ally Knodel [phone]) on 07/30/2024 11:13:23 AM   Recent Labs: 11/07/2023: TSH 0.97 07/25/2024: ALT 27; BUN 14; Creatinine, Ser 0.68; Hemoglobin 14.3; Platelets 191; Potassium 4.6; Sodium 139   Labs from primary care 12/15/16: A1c 5.6%. : Cholesterol 167 Trig-209, HDL 52, LDL 73.  AST 51, ALT 65. Normal renal indices. TSH is normal. Dated 06/24/17: CRP <0.3. Glucose 120, AST 58, ALT 80, other chemistries normal.  Dated 09/28/18: normal CBC, CMET  Lipid Panel    Component Value Date/Time   CHOL 144 07/25/2024 0807   TRIG 87 07/25/2024 0807   HDL 65 07/25/2024 0807   CHOLHDL 2.2 07/25/2024 0807   CHOLHDL 2 12/22/2022 1110   VLDL 26.0 12/22/2022 1110   LDLCALC 63 07/25/2024 0807    LDLDIRECT 82.0 06/13/2020 1017    EKG Interpretation Date/Time:  Monday July 30 2024 11:11:15 EDT Ventricular Rate:  62 PR Interval:  156 QRS Duration:  102 QT Interval:  408 QTC Calculation: 414 R Axis:   -6  Text Interpretation: Normal sinus rhythm Minimal voltage criteria for LVH, may be normal variant ( R in aVL ) Inferior infarct (cited on or before 30-Jul-2024) When compared with ECG of 13-Feb-2024 10:52, No significant change was found Confirmed by [place], Chrystie Hagwood [phone]) on 07/30/2024 11:13:23 AM    Wt Readings from Last 3 Encounters:  07/30/24 135 lb 9.6 oz (61.5 kg)  07/26/24 134 lb (60.8 kg)  07/02/24 132 lb (59.9 kg)    Cardiac cath / PCI 02/02/21: CORONARY STENT INTERVENTION  LEFT HEART CATH AND CORS/GRAFTS ANGIOGRAPHY    Conclusion  Conclusions: Severe single-vessel coronary artery disease with chronic total  occlusion of mid RCA. Mild to moderate, diffuse LAD disease with competitive flow in the distal vessel from LIMA-LAD. Small but patent LIMA-LAD. Patent SVG-rPDA-rPL with diffuse ostial/proximal disease of up to 60% and tandem hazy 95% and 90% mid graft lesions. Successful PCI to ostial/proximal and mid SVG-rPDA-rPL using non-overlapping Resolute Onyx 3.0 x 26 mm and 3.5 x 34 mm drug-eluting stents with 0% residual stenosis and TIMI-3 flow. Left forearm hematoma following sheath removal ant TR band placement, controlled with manual compression and placement of second TR band proximal to the first band.   Recommendations: Dual antiplatelet therapy with aspirin  and ticagrelor  for at least 12 months. Aggressive secondary prevention. Close monitoring of left forearm hematoma.   Lonni Hanson, MD [facility]    Coronary Diagrams   Diagnostic Dominance: Right    Intervention     Implants       Echo 01/31/21: IMPRESSIONS     1. Left ventricular ejection fraction, by estimation, is 60 to 65%. The  left ventricle has normal function. The left  ventricle has no regional  wall motion abnormalities. Left ventricular diastolic parameters are  consistent with Grade I diastolic  dysfunction (impaired relaxation).   2. Right ventricular systolic function is normal. The right ventricular  size is normal. There is normal pulmonary artery systolic pressure.   3. Left atrial size was severely dilated.   4. The mitral valve is grossly normal. Mild mitral valve regurgitation:  Apperance of two jets; study may underestimated regurgitation severity.   5. The aortic valve is tricuspid. Aortic valve regurgitation is not  visualized. No aortic stenosis is present.   6. The inferior vena cava is normal in size with greater than 50%  respiratory variability, suggesting right atrial pressure of 3 mmHg.   Comparison(s): A prior study was performed on 01/09/21. No significant  change from prior study. Prior images reviewed side by side. Similar to  prior.    Cardiac cath 06/26/21: Procedures  CORONARY STENT INTERVENTION  Intravascular Ultrasound/IVUS  LEFT HEART CATH AND CORONARY ANGIOGRAPHY   Conclusion      Dist LM lesion is 15% stenosed.   Prox RCA lesion is 75% stenosed.   Mid RCA to Dist RCA lesion is 100% stenosed with 100% stenosed side branch in RPAV.   Origin to Prox Graft lesion before RPDA  is 80% stenosed.   Non-stenotic Mid Graft-1 lesion before RPDA was previously treated.   Non-stenotic Mid Graft-2 lesion before RPDA was previously treated.   A drug-eluting stent was successfully placed using a STENT ONYX FRONTIER 3.5X26.   Post intervention, there is a 20% residual stenosis.   Seq SVG- and is large.   LIMA and is small.   There is competitive flow.   1.  Stable native vessel coronary artery disease with chronic total occlusion of the proximal to mid RCA, patency of the left main, diffuse mild to moderate calcific nonobstructive LAD stenosis, and moderate nonobstructive proximal left circumflex stenosis 2.  Continued patency  of the LIMA to LAD 3.  Continued patency of the sequential saphenous vein graft to PDA and posterolateral branches with severe in-stent restenosis in the proximal stent, both lesions likely related to stent fracture 4.  Successful intravascular ultrasound-guided PCI of the saphenous vein graft to RCA using a 3.5 x 26 mm resolute Onyx DES postdilated to high pressure with a 4.0 mm noncompliant balloon.  Diagnostic Dominance: Right Intervention    Implants    Cardiac cath 09/03/21:  Thukkani, Arun K,  MD (Primary)    [place], Zeya Balles M, MD (Assisting)     Procedures  Intravascular Ultrasound/IVUS  LEFT HEART CATH AND CORS/GRAFTS ANGIOGRAPHY   Conclusion      Dist LM lesion is 15% stenosed.   Prox RCA lesion is 75% stenosed.   Mid RCA to Dist RCA lesion is 100% stenosed with 100% stenosed side branch in RPAV.   Origin to Prox Graft lesion before RPDA  is 20% stenosed.   Non-stenotic Mid Graft-1 lesion before RPDA was previously treated.   Non-stenotic Mid Graft-2 lesion before RPDA was previously treated.   Seq SVG- and is large.   LIMA and is small.   There is competitive flow.   LV end diastolic pressure is normal.    Patent vein graft to PDA with minimal constraint; IVUS interrogation demonstrated a minimal stent area of 5.48mm2. Patent LIMA to LAD with relatively unchanged burden of disease elsewhere. Normal LVEDP   Recommendations:  Medical management.   Cardiac cath 07/15/22:  LEFT HEART CATH AND CORS/GRAFTS ANGIOGRAPHY   Conclusion      Dist LM lesion is 15% stenosed.   Prox RCA lesion is 75% stenosed.   Mid RCA to Dist RCA lesion is 100% stenosed with 100% stenosed side branch in RPAV.   Origin to Prox Graft lesion before RPDA  is 30% stenosed.   Prox Graft to Mid Graft lesion before RPDA  is 20% stenosed.   Non-stenotic Mid Graft-1 lesion before RPDA was previously treated.   Non-stenotic Mid Graft-2 lesion before RPDA was previously treated.   Seq SVG- and is  large.   LIMA and is small.   There is competitive flow.   The left ventricular systolic function is normal.   LV end diastolic pressure is normal.   The left ventricular ejection fraction is greater than 65% by visual estimate.   2 vessel obstructive CAD Patent LIMA to the LAD Patent SVG to RCA. Mild nonobstructive disease Normal LV function Normal LVEDP   Plan: continue medical therapy.  Coronary Diagrams  Diagnostic Dominance: Right  Echo 04/25/23: IMPRESSIONS     1. Intracavitary gradient. Peak velocity 1.67 m/s. Peak gradient 11 mmHg.  There is no significant change in gradient with Valsalva. Left ventricular  ejection fraction, by estimation, is 65 to 70%. The left ventricle has  normal function. The left  ventricle has no regional wall motion abnormalities. Left ventricular  diastolic parameters are consistent with Grade I diastolic dysfunction  (impaired relaxation).   2. Right ventricular systolic function is normal. The right ventricular  size is normal.   3. Left atrial size was mildly dilated.   4. The mitral valve is normal in structure. No evidence of mitral valve  regurgitation. No evidence of mitral stenosis. Moderate mitral annular  calcification.   5. The aortic valve is normal in structure. Aortic valve regurgitation is  not visualized. No aortic stenosis is present.   6. The inferior vena cava is normal in size with greater than 50%  respiratory variability, suggesting right atrial pressure of 3 mmHg.   ASSESSMENT AND PLAN:  1. CAD status post CABG in 1995.  s/p NSTEMI in Feb 2022.  Cardiac cath demonstrated patent LIMA to the LAD. Occluded native RCA. SVG to PDA/PL had severe sequential stenoses treated with DES x 2. Normal EF.  Developed dyspnea on Brilinta . Allergic reaction to Plavix . Now on Prasugrel  lower dose due to age and female sex. Had recurrent angina in July 2022. Restenosis in the proximal  stent in the SVG to RCA with some associated stent  fracture. Had repeat stenting with IVUS guidance. Last cardiac cath August 2023 showed stable disease with only 30% in stent stenosis in SVG to RCA. Normal LV function and normal LVEDP. She generally has done well but did have some angina last night. Relieved with Ntg. Ecg is stable today.  Will continue current therapy and monitor.  2. HTN with hypertensive heart disease. BP is good today  3. Hypercholesterolemia- LDL 63 on statin and Zetia .   4.  Status post stent to right renal artery 2008. Patent by CT in August 2024  5. Status post stent graft to abdominal aortic aneurysm 2009.  S/p iliac stent in 2016.follow up with [facility].   6. Peripheral artery disease followed by Dr.Cain  7.  Severe emphysema. Per Dr Annella.    Dispostion: follow up in 6 months    Signed, Silas Sedam Sw[place]iland MD, [facility]    07/30/2024 11:33 AM    [facility]

## 2024-07-20 ENCOUNTER — Ambulatory Visit: Attending: Cardiovascular Disease | Admitting: Nurse Practitioner

## 2024-07-20 DIAGNOSIS — Z01810 Encounter for preprocedural cardiovascular examination: Secondary | ICD-10-CM

## 2024-07-20 NOTE — Progress Notes (Signed)
 Virtual Visit via Telephone Note   Because of Megan [name] co-morbid illnesses, she is at least at moderate risk for complications without adequate follow up.  This format is felt to be most appropriate for this patient at this time.  Due to technical limitations with video connection (technology), today's appointment will be conducted as an audio only telehealth visit, and Megan [name] verbally agreed to proceed in this manner.   All issues noted in this document were discussed and addressed.  No physical exam could be performed with this format.  Evaluation Performed:  Preoperative cardiovascular risk assessment _____________   Date:  07/20/2024   Patient ID:  Megan [name], DOB 15-Jul-1946, MRN [id number] Patient Location:  Home Provider location:   Office  Primary Care Provider:  Jodie Lavern CROME, MD Primary Cardiologist:  Peter Swaziland, MD  Chief Complaint / Patient Profile   78 y.o. y/o female with a h/o CAD s/p CABG in 1995, DES x 2 overlapping-SVG-RCA in 01/2021, DES-SVG-RCA (ISR) in 06/2021 renal artery stenosis s/p renal artery stenting, abdominal aortic aneurysm s/p stent graft, PAD s/p left iliac stenting, hypertension, hyperlipidemia, and emphysema who is pending bilateral lower eyelid blepharoplasty on 07/30/2024 with Dr. Renzo Zaldivar of [facility] and presents today for telephonic preoperative cardiovascular risk assessment.  History of Present Illness    Megan [name] is a 77 y.o. female who presents via audio/video conferencing for a telehealth visit today.  Pt was last seen in cardiology clinic on 02/13/2024 by Dr. Swaziland.  At that time Megan [name] was doing well. The patient is now pending procedure as outlined above. Since her last visit, she has done well from a cardiac standpoint.   She denies chest pain, palpitations, dyspnea, pnd, orthopnea, n, v, dizziness, syncope, edema, weight gain, or early satiety. All other systems reviewed and are otherwise  negative except as noted above.   Past Medical History    Past Medical History:  Diagnosis Date   AAA (abdominal aortic aneurysm) (HCC)    a. s/p stent grafting in 2009.   Adenomatous colon polyp 05/2001   Adenomatous duodenal polyp    Allergy    Anginal pain (HCC)    COPD (chronic obstructive pulmonary disease) (HCC)    pt denies COPD   Coronary artery disease    a. s/p CABG in 1995;  b. 05/2013 Neg MV, EF 82%;  c. 06/2013 Cath: LM nl, LAD 40-50p, LCX nl, RCA 80p/126m, VG->RCA->PDA->PLSA min irregs, LIMA->LAD atretic, vigorous LV fxn.   Diabetes mellitus without complication (HCC)    history of, resolved. 2017   Diverticulosis    Eczema, dyshidrotic    Eczema, dyshidrotic 1986   Dr. Prentice Mayhew   Emphysema of lung [facility])    Fatty liver    Fibromyalgia    GERD (gastroesophageal reflux disease)    Hyperlipidemia    Hypertension    Migraine    Myocardial infarction [facility])    Osteoarthritis    Osteoporosis    unsure, having a bone scan soon   Peripheral arterial disease (HCC)    left leg diagnosed by Dr Dominick   Renal artery stenosis [facility]    a. 2008 s/p PTA   Past Surgical History:  Procedure Laterality Date   ABDOMINAL AORTAGRAM N/A 03/06/2014   Procedure: ABDOMINAL AORTAGRAM;  Surgeon: Krystal JULIANNA Doing, MD;  Location: [facility] CATH LAB;  Service: Cardiovascular;  Laterality: N/A;   ABDOMINAL AORTIC ANEURYSM REPAIR  2009   stenting  ABDOMINAL HYSTERECTOMY     Total, 1992   BREAST BIOPSY     CARDIAC CATHETERIZATION     CARDIOVASCULAR STRESS TEST  11/11/2009   normal study   CARPAL TUNNEL RELEASE  08/1993   left wrist   CARPAL TUNNEL RELEASE  01/1997   right   CATARACT EXTRACTION, BILATERAL  2021   CORONARY ARTERY BYPASS GRAFT  07/1994   Triple by Dr Katherene Jude   CORONARY STENT INTERVENTION N/A 02/02/2021   Procedure: CORONARY STENT INTERVENTION;  Surgeon: Mady Bruckner, MD;  Location: [facility] INVASIVE CV LAB;  Service: Cardiovascular;  Laterality: N/A;   CORONARY STENT  INTERVENTION N/A 06/26/2021   Procedure: CORONARY STENT INTERVENTION;  Surgeon: Wonda Sharper, MD;  Location: [facility] INVASIVE CV LAB;  Service: Cardiovascular;  Laterality: N/A;   CORONARY ULTRASOUND/IVUS N/A 06/26/2021   Procedure: Intravascular Ultrasound/IVUS;  Surgeon: Wonda Sharper, MD;  Location: [facility] INVASIVE CV LAB;  Service: Cardiovascular;  Laterality: N/A;   CORONARY ULTRASOUND/IVUS N/A 09/03/2021   Procedure: Intravascular Ultrasound/IVUS;  Surgeon: Wendel Lurena POUR, MD;  Location: [facility] INVASIVE CV LAB;  Service: Cardiovascular;  Laterality: N/A;   dental implants     EYE SURGERY  03/2020   cataract   HAND SURGERY Right 09/2002   Right hand pulley release   LAMINECTOMY WITH POSTERIOR LATERAL ARTHRODESIS LEVEL 1 N/A 03/24/2022   Procedure: LAMINECTOMY, FORAMINOTOMY, LUMBAR TWO- THREE ; INTERTRANSVERSE FUSION LUMBAR TWO - LUMBAR THREE RIGHT;  Surgeon: Joshua Alm RAMAN, MD;  Location: [facility] OR;  Service: Neurosurgery;  Laterality: N/A;   LEFT HEART CATH AND CORONARY ANGIOGRAPHY N/A 06/26/2021   Procedure: LEFT HEART CATH AND CORONARY ANGIOGRAPHY;  Surgeon: Wonda Sharper, MD;  Location: [facility] INVASIVE CV LAB;  Service: Cardiovascular;  Laterality: N/A;   LEFT HEART CATH AND CORS/GRAFTS ANGIOGRAPHY N/A 02/02/2021   Procedure: LEFT HEART CATH AND CORS/GRAFTS ANGIOGRAPHY;  Surgeon: Mady Bruckner, MD;  Location: [facility] INVASIVE CV LAB;  Service: Cardiovascular;  Laterality: N/A;   LEFT HEART CATH AND CORS/GRAFTS ANGIOGRAPHY N/A 09/03/2021   Procedure: LEFT HEART CATH AND CORS/GRAFTS ANGIOGRAPHY;  Surgeon: Wendel Lurena POUR, MD;  Location: [facility] INVASIVE CV LAB;  Service: Cardiovascular;  Laterality: N/A;   LEFT HEART CATH AND CORS/GRAFTS ANGIOGRAPHY N/A 07/15/2022   Procedure: LEFT HEART CATH AND CORS/GRAFTS ANGIOGRAPHY;  Surgeon: Swaziland, Peter M, MD;  Location: [facility] INVASIVE CV LAB;  Service: Cardiovascular;  Laterality: N/A;   LEFT HEART CATHETERIZATION WITH CORONARY/GRAFT ANGIOGRAM N/A 06/11/2013   Procedure: LEFT  HEART CATHETERIZATION WITH EL BILE;  Surgeon: Aleene JINNY Passe, MD;  Location: [facility] CATH LAB;  Service: Cardiovascular;  Laterality: N/A;   MULTIPLE TOOTH EXTRACTIONS  2000   All upper teeth.  Has full dneture.    RENAL ARTERY STENT  2008   SHOULDER ARTHROSCOPY WITH ROTATOR CUFF REPAIR Right 09/27/2018   Procedure: Right shoulder mini open rotator cuff repair;  Surgeon: Duwayne Purchase, MD;  Location: [facility] ORS;  Service: Orthopedics;  Laterality: Right;  90 mins   thyroid  cyst apiration  05/1997 and 07/1998   TONSILECTOMY, ADENOIDECTOMY, BILATERAL MYRINGOTOMY AND TUBES  1989   TONSILLECTOMY     TOTAL ABDOMINAL HYSTERECTOMY  1992    Allergies  Allergies  Allergen Reactions   Brilinta  [Ticagrelor ] Shortness Of Breath   Bextra [Valdecoxib] Other (See Comments)    Increased lft's    Hydrocod Poli-Chlorphe Poli Er Rash   Lipitor  [Atorvastatin ] Other (See Comments)    Leg weakness    Mevacor [Lovastatin] Other (See Comments)    Muscle weakness  Plavix  [Clopidogrel  Bisulfate] Itching and Other (See Comments)    Hands and feet tingle and itch   Zocor [Simvastatin] Other (See Comments)    Bones hurt    Doxycycline  Diarrhea and Nausea And Vomiting   Metoprolol  Other (See Comments)    Hair loss   Morphine  And Codeine  Itching and Nausea Only   Codeine  Itching and Rash    Hyper-active   Lisinopril Cough    Home Medications    Prior to Admission medications   Medication Sig Start Date End Date Taking? Authorizing Provider  Accu-Chek FastClix Lancets MISC Check blood sugar once daily PRN 07/30/19   Jodie Lavern CROME, MD  albuterol  (PROAIR  HFA) 108 (90 Base) MCG/ACT inhaler Inhale 2 puffs into the lungs every 6 (six) hours as needed for wheezing or shortness of breath. 12/14/22   Hunsucker, Donnice SAUNDERS, MD  albuterol  (PROVENTIL ) (2.5 MG/3ML) 0.083% nebulizer solution Take 3 mLs (2.5 mg total) by nebulization every 6 (six) hours as needed for wheezing or shortness of breath. 02/17/23    Hope Almarie ORN, NP  ALPRAZolam  (XANAX ) 0.25 MG tablet Take 1 tablet (0.25 mg total) by mouth at bedtime as needed for sleep. 09/15/20   [place], Peter M, MD  amLODipine  (NORVASC ) 5 MG tablet Take 1/2 tablet ( 2.5 mg ) daily 09/20/23   [place], Peter M, MD  aspirin  EC 81 MG tablet Take 1 tablet (81 mg total) by mouth daily. 05/09/20   [place], Peter M, MD  atorvastatin  (LIPITOR ) 80 MG tablet TAKE 1 TABLET BY MOUTH EVERY DAY 07/16/24   [place], Peter M, MD  azithromycin  (ZITHROMAX ) 250 MG tablet Take 1 tablet (250 mg total) by mouth 3 (three) times a week. Monday Wednesday Friday 04/02/24   Hunsucker, Donnice SAUNDERS, MD  B Complex-Biotin-FA (B-100 COMPLEX PO) Take 1 tablet by mouth every evening.    [provider]  bismuth subsalicylate (PEPTO BISMOL) 262 MG/15ML suspension Take 30 mLs by mouth every 6 (six) hours as needed for indigestion or diarrhea or loose stools.    [provider]  budesonide-glycopyrrolate -formoterol  (BREZTRI  AEROSPHERE) 160-9-4.8 MCG/ACT AERO inhaler Inhale 2 puffs into the lungs in the morning and at bedtime. 05/30/24   Jodie Lavern CROME, MD  cetirizine (ZYRTEC) 10 MG tablet Take 10 mg by mouth daily as needed for allergies.    [provider]  cholecalciferol  (VITAMIN D3) 25 MCG (1000 UNIT) tablet Take 1,000 Units by mouth every evening.    [provider]  CORDRAN  4 MCG/SQCM TAPE Apply 1 each topically daily as needed (Rash).  05/17/19   [provider]  cycloSPORINE  (RESTASIS ) 0.05 % ophthalmic emulsion Place 1 drop into both eyes 2 (two) times daily.     [provider]  ezetimibe  (ZETIA ) 10 MG tablet TAKE 1 TABLET BY MOUTH EVERY DAY 12/19/23   [place], Peter M, MD  FLUZONE HIGH-DOSE 0.5 ML injection  07/27/23   [provider]  gabapentin  (NEURONTIN ) 300 MG capsule Take 1 capsule (300 mg total) by mouth 2 (two) times daily. 04/13/24   Penumalli, Vikram R, MD  glucose blood (ACCU-CHEK SMARTVIEW) test strip CHECK BLOOD SUGAR  ONCE DAILY AS NEEDED 01/17/20   Jodie Lavern CROME, MD  HYDROcodone  bit-homatropine [facility]) 5-1.5 MG/5ML syrup Take 5 mLs by mouth every 6 (six) hours as needed for cough. 11/01/23   Hunsucker, Donnice SAUNDERS, MD  isosorbide  mononitrate (IMDUR ) 30 MG 24 hr tablet TAKE 3 TABLETS ( 90 MG ) EVERY MORNING AND 1 TABLET (30 MG ) EVERY  EVENING 12/23/23   [place], Peter M, MD  losartan  (COZAAR ) 100 MG tablet TAKE 1 TABLET BY MOUTH EVERY DAY 05/23/24   Webb, Padonda B, FNP  metoprolol  succinate (TOPROL -XL) 25 MG 24 hr tablet TAKE 1 TABLET (25 MG TOTAL) BY MOUTH DAILY. 03/14/24   [place], Peter M, MD  Multiple Vitamins-Minerals (MULTIVITAMIN WITH MINERALS) tablet Take 1 tablet by mouth every evening.    [provider]  nitroGLYCERIN  (NITROSTAT ) 0.4 MG SL tablet Place 1 tablet (0.4 mg total) under the tongue every 5 (five) minutes as needed for chest pain. 07/30/22   [place], Peter M, MD  omeprazole  (PRILOSEC) 40 MG capsule Take 1 capsule (40 mg total) by mouth daily. 03/06/24   Suzann Inocente HERO, MD  ondansetron  (ZOFRAN ) 8 MG tablet Take 1 tablet (8 mg total) by mouth every 6 (six) hours as needed for nausea or vomiting. 05/24/23   Johnny Garnette LABOR, MD  oxyCODONE -acetaminophen  (PERCOCET) 10-325 MG tablet Take 1 tablet by mouth in the morning and at bedtime. Can take one tablet three times a day as needed for back pain    [provider]  polyethylene glycol (MIRALAX  / GLYCOLAX ) 17 g packet Take 17 g by mouth daily as needed for moderate constipation.    [provider]  prasugrel  (EFFIENT ) 5 MG TABS tablet TAKE 1 TABLET (5 MG TOTAL) BY MOUTH DAILY. 04/18/24   [place], Peter M, MD  PRESCRIPTION MEDICATION Place 2 puffs into both ears daily as needed (ear infections). [facility] ear powder    [provider]  Simethicone  (GAS-X PO) Take 1-2 tablets by mouth daily as needed (gas).    [provider]  [facility] syringe  08/13/23   [provider]  torsemide  (DEMADEX ) 20 MG tablet Take 1  tablet (20 mg total) by mouth daily. Take 1/2 tablet daily as needed for swelling 08/29/23   [place], Peter M, MD  tretinoin  (RETIN-A ) 0.1 % cream Apply 1 application. topically every evening.    [provider]    Physical Exam    Vital Signs:  Megan [name] does not have vital signs available for review today.  Given telephonic nature of communication, physical exam is limited. AAOx3. NAD. Normal affect.  Speech and respirations are unlabored.  Accessory Clinical Findings    None  Assessment & Plan    1.  Preoperative Cardiovascular Risk Assessment:  According to the Revised Cardiac Risk Index (RCRI), her Perioperative Risk of Major Cardiac Event is (%): 0.9. Her Functional Capacity in METs is: 5.81 according to the [facility] Activity Status Index (DASI).Therefore, based on ACC/AHA guidelines, patient would be at acceptable risk for the planned procedure without further cardiovascular testing.   The patient was advised that if she develops new symptoms prior to surgery to contact our office to arrange for a follow-up visit, and she verbalized understanding.  Per Dr. Sw[place]and, I am OK with holding Effient  but should remain on ASA 81 mg daily. Patient may hold Effient  for 7 days prior to procedure and resume as soon as safe to do so from a bleeding standpoint. She should remain on ASA 81 mg daily throughout the perioperative period.     A copy of this note will be routed to requesting surgeon.  Time:   Today, I have spent 7 minutes with the patient with telehealth technology discussing medical history, symptoms, and management plan.     Damien JAYSON Braver, NP  07/20/2024, 10:32 AM

## 2024-07-25 DIAGNOSIS — I25708 Atherosclerosis of coronary artery bypass graft(s), unspecified, with other forms of angina pectoris: Secondary | ICD-10-CM | POA: Diagnosis not present

## 2024-07-25 DIAGNOSIS — E785 Hyperlipidemia, unspecified: Secondary | ICD-10-CM | POA: Diagnosis not present

## 2024-07-25 DIAGNOSIS — E119 Type 2 diabetes mellitus without complications: Secondary | ICD-10-CM | POA: Diagnosis not present

## 2024-07-25 DIAGNOSIS — I10 Essential (primary) hypertension: Secondary | ICD-10-CM | POA: Diagnosis not present

## 2024-07-26 ENCOUNTER — Ambulatory Visit: Payer: Self-pay | Admitting: Cardiology

## 2024-07-26 ENCOUNTER — Ambulatory Visit: Admitting: Pulmonary Disease

## 2024-07-26 ENCOUNTER — Encounter: Payer: Self-pay | Admitting: Pulmonary Disease

## 2024-07-26 VITALS — BP 148/70 | HR 58 | Temp 97.9°F | Ht 59.5 in | Wt 134.0 lb

## 2024-07-26 DIAGNOSIS — R0609 Other forms of dyspnea: Secondary | ICD-10-CM | POA: Diagnosis not present

## 2024-07-26 DIAGNOSIS — R0902 Hypoxemia: Secondary | ICD-10-CM

## 2024-07-26 DIAGNOSIS — Z87891 Personal history of nicotine dependence: Secondary | ICD-10-CM | POA: Diagnosis not present

## 2024-07-26 DIAGNOSIS — J439 Emphysema, unspecified: Secondary | ICD-10-CM

## 2024-07-26 DIAGNOSIS — J432 Centrilobular emphysema: Secondary | ICD-10-CM

## 2024-07-26 LAB — CBC WITH DIFFERENTIAL/PLATELET
Basophils Absolute: 0 x10E3/uL (ref 0.0–0.2)
Basos: 1 %
EOS (ABSOLUTE): 0.2 x10E3/uL (ref 0.0–0.4)
Eos: 3 %
Hematocrit: 44.9 % (ref 34.0–46.6)
Hemoglobin: 14.3 g/dL (ref 11.1–15.9)
Immature Grans (Abs): 0 x10E3/uL (ref 0.0–0.1)
Immature Granulocytes: 0 %
Lymphocytes Absolute: 2.1 x10E3/uL (ref 0.7–3.1)
Lymphs: 27 %
MCH: 29.8 pg (ref 26.6–33.0)
MCHC: 31.8 g/dL (ref 31.5–35.7)
MCV: 94 fL (ref 79–97)
Monocytes Absolute: 0.5 x10E3/uL (ref 0.1–0.9)
Monocytes: 7 %
Neutrophils Absolute: 4.9 x10E3/uL (ref 1.4–7.0)
Neutrophils: 62 %
Platelets: 191 x10E3/uL (ref 150–450)
RBC: 4.8 x10E6/uL (ref 3.77–5.28)
RDW: 12.8 % (ref 11.7–15.4)
WBC: 7.7 x10E3/uL (ref 3.4–10.8)

## 2024-07-26 LAB — COMPREHENSIVE METABOLIC PANEL WITH GFR
ALT: 27 IU/L (ref 0–32)
AST: 23 IU/L (ref 0–40)
Albumin: 4 g/dL (ref 3.8–4.8)
Alkaline Phosphatase: 74 IU/L (ref 44–121)
BUN/Creatinine Ratio: 21 (ref 12–28)
BUN: 14 mg/dL (ref 8–27)
Bilirubin Total: 0.5 mg/dL (ref 0.0–1.2)
CO2: 21 mmol/L (ref 20–29)
Calcium: 9.4 mg/dL (ref 8.7–10.3)
Chloride: 104 mmol/L (ref 96–106)
Creatinine, Ser: 0.68 mg/dL (ref 0.57–1.00)
Globulin, Total: 1.8 g/dL (ref 1.5–4.5)
Glucose: 88 mg/dL (ref 70–99)
Potassium: 4.6 mmol/L (ref 3.5–5.2)
Sodium: 139 mmol/L (ref 134–144)
Total Protein: 5.8 g/dL — ABNORMAL LOW (ref 6.0–8.5)
eGFR: 89 mL/min/1.73 (ref >59)

## 2024-07-26 LAB — LIPID PANEL
Chol/HDL Ratio: 2.2 ratio (ref 0.0–4.4)
Cholesterol, Total: 144 mg/dL (ref 100–199)
HDL: 65 mg/dL (ref >39)
LDL Chol Calc (NIH): 63 mg/dL (ref 0–99)
Triglycerides: 87 mg/dL (ref 0–149)
VLDL Cholesterol Cal: 16 mg/dL (ref 5–40)

## 2024-07-26 LAB — HEMOGLOBIN A1C
Est. average glucose Bld gHb Est-mCnc: 123 mg/dL
Hgb A1c MFr Bld: 5.9 % — ABNORMAL HIGH (ref 4.8–5.6)

## 2024-07-26 MED ORDER — PREDNISONE 20 MG PO TABS: 40.0000 mg | ORAL_TABLET | Freq: Every day | ORAL | 0 refills | Status: AC

## 2024-07-26 MED ORDER — ALBUTEROL SULFATE HFA 108 (90 BASE) MCG/ACT IN AERS: 2.0000 | INHALATION_SPRAY | Freq: Four times a day (QID) | RESPIRATORY_TRACT | 11 refills | Status: AC | PRN

## 2024-07-26 NOTE — Patient Instructions (Signed)
 Nice to see you again  No changes to medication  I sent prednisone  40 mg daily for 5 days, discussed with your surgeon on when we should take this, if symptoms worsen go ahead and take it

## 2024-07-26 NOTE — Progress Notes (Signed)
 @Patient  ID: Megan [name], female    DOB: 1946/01/27, 78 y.o.   MRN: [id number]  Chief Complaint  Patient presents with   Follow-up    COPD Pt states she has been doing well, has had increased symptoms over the last month.    Referring provider: Jodie Lavern CROME, MD  HPI:   78 y.o. whom are seeing for acute visit for evaluation of cough productive green phlegm, wheezing, chills previously seen for evaluation of dyspnea on exertion related to emphysema and likely cardiac causes.  Most recent GI note reviewed.    Most recent cardiology note reviewed.  Overall doing okay.  Good adherence to her medications.  Last month a bit of chest tightness shortness of breath using albuterol  more.  Not on a daily basis but more.  Triggered after visiting a dog she had seen before.  Hopeful will get better.  She has upcoming oculoplastic surgery next week.  HPI at initial visit: She was in her usual state of health.  She developed onset of shortness of breath a few weeks ago.  Different than baseline dyspnea.  Recurrent shorter distances.  Preceded with aches, sounds like viral infection.  Still some heart ache as well which is concerning to her given her early CAD and significant coronary history.  This prompted presentation to the ED.  There CTA PE protocol was negative for PE but showed presevere emphysematous changes with predilection for the upper lobes otherwise clear on my review and interpretation.  She subsequent underwent a left heart catheterization showed stable coronary artery disease and prior PCI and CABG.  This was reassuring.  In the interim she was placed on Breztri .  She flexes helped some with her shortness of breath.  Able to do more.  Still get a little bit winded on longer walks etc.  She used to walk a mile and a half a day.  She is working back to getting to that distance.  PMH: Tobacco abuse in remission, hypertension, CAD, asthma, hyperlipidemia, seasonal allergies Surgical  history: Back surgery, CABG, PCI Family history: Mother and father with CAD Social history: Former smoker, quit 1997, lives in [place] / Pulmonary Flowsheets:   ACT:      No data to display          MMRC:     No data to display          Epworth:      No data to display          Tests:   FENO:  No results found for: NITRICOXIDE  PFT:    Latest Ref Rng & Units 03/03/2023    1:20 PM  PFT Results  FVC-Pre L 1.85   FVC-Predicted Pre % 83   FVC-Post L 1.84   FVC-Predicted Post % 82   Pre FEV1/FVC % % 76   Post FEV1/FCV % % 76   FEV1-Pre L 1.41   FEV1-Predicted Pre % 85   FEV1-Post L 1.39   DLCO uncorrected ml/min/mmHg 9.07   DLCO UNC% % 55   DLCO corrected ml/min/mmHg 8.81   DLCO COR %Predicted % 53   DLVA Predicted % 67   TLC L 3.82   TLC % Predicted % 85   RV % Predicted % 92   Personally reviewed and interpreted as normal spirometry, lung volumes within normal limits, DLCO moderately reduced  WALK:     09/15/2023    3:16 PM 06/22/2023   10:29 AM  06/02/2023    4:34 PM 02/17/2023    4:46 PM  SIX MIN WALK  Supplimental Oxygen  during Test? (L/min)   Yes Yes  O2 Flow Rate   4 L/min 2 L/min  Type   Pulse Continuous  2 Minute Oxygen  Saturation % 90 % 88 %    2 Minute HR 89 80    4 Minute Oxygen  Saturation % 92 % 90 %    4 Minute HR 94 79    6 Minute Oxygen  Saturation % 90 % 91 %    6 Minute HR 98 80    Tech Comments:   Patient walked 2 laps on POC to see what pulsed level she needed to keep sats >88-90%.  She requires 4L pulsed to stay above 88-90%.  Tolerated walk well,. Patient SaO2 when brought back to exam room was 88% on room air.  Patient was immediately put on oxygen  2 lpm continuous.  After 5 minutes, SaO2 at 93%. Patient was able to walk 1 lap on 2 lpm continuous oxygen  (tank) and SaO2 at 94%.  Patient c/o fatigue.  Patient was unable to walk any further laps.  Patient taken back to exam room. and SaO2 at  94% and heart  rate 80 bpm.  Patient left with an oxygen  tank from [employer] (from closet).  Oxygen  set on 2 lpm when patient taken to waiting room.  Patient did require assistance to get the oxygen  tank/roller in her car.    Imaging: Personally reviewed and as per EMR discussion in this note No results found.   Lab Results: Personally reviewed CBC    Component Value Date/Time   WBC 7.7 07/25/2024 0807   WBC 15.0 (H) 02/17/2023 1026   RBC 4.80 07/25/2024 0807   RBC 4.66 02/17/2023 1026   HGB 14.3 07/25/2024 0807   HCT 44.9 07/25/2024 0807   PLT 191 07/25/2024 0807   MCV 94 07/25/2024 0807   MCH 29.8 07/25/2024 0807   MCH 29.5 07/11/2022 1912   MCHC 31.8 07/25/2024 0807   MCHC 34.5 02/17/2023 1026   RDW 12.8 07/25/2024 0807   LYMPHSABS 2.1 07/25/2024 0807   MONOABS 0.9 02/17/2023 1026   EOSABS 0.2 07/25/2024 0807   BASOSABS 0.0 07/25/2024 0807    BMET    Component Value Date/Time   NA 139 07/25/2024 0807   K 4.6 07/25/2024 0807   CL 104 07/25/2024 0807   CO2 21 07/25/2024 0807   GLUCOSE 88 07/25/2024 0807   GLUCOSE 119 (H) 02/17/2023 1026   BUN 14 07/25/2024 0807   CREATININE 0.68 07/25/2024 0807   CREATININE 0.71 06/29/2016 1057   CALCIUM  9.4 07/25/2024 0807   GFRNONAA >60 07/11/2022 1912   GFRAA 89 01/01/2020 0835    BNP    Component Value Date/Time   BNP 94.2 06/29/2022 1538   BNP 862.1 (H) 01/08/2021 1630    ProBNP    Component Value Date/Time   PROBNP 145.0 (H) 02/17/2023 1026    Specialty Problems       Pulmonary Problems   COPD with chronic bronchitis (HCC)   Centrilobular emphysema (HCC)   Chronic respiratory failure (HCC)   Pneumonia   Oxygen  dependent    Allergies  Allergen Reactions   Brilinta  [Ticagrelor ] Shortness Of Breath   Bextra [Valdecoxib] Other (See Comments)    Increased lft's    Hydrocod Poli-Chlorphe Poli Er Rash   Lipitor  [Atorvastatin ] Other (See Comments)    Leg weakness    Mevacor [Lovastatin] Other (See Comments)  Muscle  weakness    Plavix  [Clopidogrel  Bisulfate] Itching and Other (See Comments)    Hands and feet tingle and itch   Zocor [Simvastatin] Other (See Comments)    Bones hurt    Doxycycline  Diarrhea and Nausea And Vomiting   Metoprolol  Other (See Comments)    Hair loss   Morphine  And Codeine  Itching and Nausea Only   Codeine  Itching and Rash    Hyper-active   Lisinopril Cough    Immunization History  Administered Date(s) Administered   Fluad Quad(high Dose 65+) 07/30/2019, 08/10/2022   Influenza, High Dose Seasonal PF 08/13/2017, 08/26/2018, 09/15/2020   Influenza, Seasonal, Injecte, Preservative Fre 09/21/2014, 09/06/2015, 07/21/2016   Influenza,inj,quad, With Preservative 08/06/2017   Influenza-Unspecified 08/10/2017, 10/13/2021   Moderna Covid-19 Fall Seasonal Vaccine 40yrs & older 08/13/2023   PFIZER(Purple Top)SARS-COV-2 Vaccination 12/22/2019, 01/12/2020, 09/15/2020   PPD Test 09/13/1979, 03/10/2021   Pfizer Covid-19 Vaccine Bivalent Booster 52yrs & up 10/13/2021   Pneumococcal Conjugate-13 12/31/2016   Pneumococcal Polysaccharide-23 12/16/2014, 08/01/2019   Respiratory Syncytial Virus Vaccine,Recomb Aduvanted(Arexvy) 10/06/2022   Td 05/13/2008   Td (Adult), 2 Lf Tetanus Toxid, Preservative Free 05/13/2008   Tdap 08/09/2012   Zoster Recombinant(Shingrix) 01/13/2018, 03/14/2018   Zoster, Live 11/03/2010    Past Medical History:  Diagnosis Date   AAA (abdominal aortic aneurysm) (HCC)    a. s/p stent grafting in 2009.   Adenomatous colon polyp 05/2001   Adenomatous duodenal polyp    Allergy    Anginal pain (HCC)    COPD (chronic obstructive pulmonary disease) (HCC)    pt denies COPD   Coronary artery disease    a. s/p CABG in 1995;  b. 05/2013 Neg MV, EF 82%;  c. 06/2013 Cath: LM nl, LAD 40-50p, LCX nl, RCA 80p/132m, VG->RCA->PDA->PLSA min irregs, LIMA->LAD atretic, vigorous LV fxn.   Diabetes mellitus without complication (HCC)    history of, resolved. 2017    Diverticulosis    Eczema, dyshidrotic    Eczema, dyshidrotic 1986   Dr. Prentice Mayhew   Emphysema of lung [facility])    Fatty liver    Fibromyalgia    GERD (gastroesophageal reflux disease)    Hyperlipidemia    Hypertension    Migraine    Myocardial infarction [facility])    Osteoarthritis    Osteoporosis    unsure, having a bone scan soon   Peripheral arterial disease (HCC)    left leg diagnosed by Dr Dominick   Renal artery stenosis [facility])    a. 2008 s/p PTA    Tobacco History: Social History   Tobacco Use  Smoking Status Former   Current packs/day: 0.00   Types: Cigarettes   Quit date: 09/05/1996   Years since quitting: 27.9   Passive exposure: Never  Smokeless Tobacco Never   Counseling given: Not Answered   Continue to not smoke  Outpatient Encounter Medications as of 07/26/2024  Medication Sig   Accu-Chek FastClix Lancets MISC Check blood sugar once daily PRN   albuterol  (PROVENTIL ) (2.5 MG/3ML) 0.083% nebulizer solution Take 3 mLs (2.5 mg total) by nebulization every 6 (six) hours as needed for wheezing or shortness of breath.   ALPRAZolam  (XANAX ) 0.25 MG tablet Take 1 tablet (0.25 mg total) by mouth at bedtime as needed for sleep.   amLODipine  (NORVASC ) 5 MG tablet Take 1/2 tablet ( 2.5 mg ) daily   aspirin  EC 81 MG tablet Take 1 tablet (81 mg total) by mouth daily.   atorvastatin  (LIPITOR ) 80 MG tablet TAKE 1 TABLET  BY MOUTH EVERY DAY   azithromycin  (ZITHROMAX ) 250 MG tablet Take 1 tablet (250 mg total) by mouth 3 (three) times a week. Monday Wednesday Friday   B Complex-Biotin-FA (B-100 COMPLEX PO) Take 1 tablet by mouth every evening.   bismuth subsalicylate (PEPTO BISMOL) 262 MG/15ML suspension Take 30 mLs by mouth every 6 (six) hours as needed for indigestion or diarrhea or loose stools.   budesonide-glycopyrrolate -formoterol  (BREZTRI  AEROSPHERE) 160-9-4.8 MCG/ACT AERO inhaler Inhale 2 puffs into the lungs in the morning and at bedtime.   cetirizine (ZYRTEC) 10 MG  tablet Take 10 mg by mouth daily as needed for allergies.   cholecalciferol  (VITAMIN D3) 25 MCG (1000 UNIT) tablet Take 1,000 Units by mouth every evening.   CORDRAN  4 MCG/SQCM TAPE Apply 1 each topically daily as needed (Rash).    cycloSPORINE  (RESTASIS ) 0.05 % ophthalmic emulsion Place 1 drop into both eyes 2 (two) times daily.    ezetimibe  (ZETIA ) 10 MG tablet TAKE 1 TABLET BY MOUTH EVERY DAY   FLUZONE HIGH-DOSE 0.5 ML injection    gabapentin  (NEURONTIN ) 300 MG capsule Take 1 capsule (300 mg total) by mouth 2 (two) times daily.   glucose blood (ACCU-CHEK SMARTVIEW) test strip CHECK BLOOD SUGAR ONCE DAILY AS NEEDED   HYDROcodone  bit-homatropine (HYCODAN) 5-1.5 MG/5ML syrup Take 5 mLs by mouth every 6 (six) hours as needed for cough.   isosorbide  mononitrate (IMDUR ) 30 MG 24 hr tablet TAKE 3 TABLETS ( 90 MG ) EVERY MORNING AND 1 TABLET (30 MG ) EVERY EVENING   losartan  (COZAAR ) 100 MG tablet TAKE 1 TABLET BY MOUTH EVERY DAY   metoprolol  succinate (TOPROL -XL) 25 MG 24 hr tablet TAKE 1 TABLET (25 MG TOTAL) BY MOUTH DAILY.   Multiple Vitamins-Minerals (MULTIVITAMIN WITH MINERALS) tablet Take 1 tablet by mouth every evening.   nitroGLYCERIN  (NITROSTAT ) 0.4 MG SL tablet Place 1 tablet (0.4 mg total) under the tongue every 5 (five) minutes as needed for chest pain.   omeprazole  (PRILOSEC) 40 MG capsule Take 1 capsule (40 mg total) by mouth daily.   ondansetron  (ZOFRAN ) 8 MG tablet Take 1 tablet (8 mg total) by mouth every 6 (six) hours as needed for nausea or vomiting.   oxyCODONE -acetaminophen  (PERCOCET) 10-325 MG tablet Take 1 tablet by mouth in the morning and at bedtime. Can take one tablet three times a day as needed for back pain   polyethylene glycol (MIRALAX  / GLYCOLAX ) 17 g packet Take 17 g by mouth daily as needed for moderate constipation.   prasugrel  (EFFIENT ) 5 MG TABS tablet TAKE 1 TABLET (5 MG TOTAL) BY MOUTH DAILY.   predniSONE  (DELTASONE ) 20 MG tablet Take 2 tablets (40 mg total) by  mouth daily with breakfast for 5 days.   PRESCRIPTION MEDICATION Place 2 puffs into both ears daily as needed (ear infections). CSAHC ear powder   Simethicone  (GAS-X PO) Take 1-2 tablets by mouth daily as needed (gas).   SPIKEVAX syringe    torsemide  (DEMADEX ) 20 MG tablet Take 1 tablet (20 mg total) by mouth daily. Take 1/2 tablet daily as needed for swelling   tretinoin  (RETIN-A ) 0.1 % cream Apply 1 application. topically every evening.   [DISCONTINUED] albuterol  (PROAIR  HFA) 108 (90 Base) MCG/ACT inhaler Inhale 2 puffs into the lungs every 6 (six) hours as needed for wheezing or shortness of breath.   albuterol  (PROAIR  HFA) 108 (90 Base) MCG/ACT inhaler Inhale 2 puffs into the lungs every 6 (six) hours as needed for wheezing or shortness of breath.  No facility-administered encounter medications on file as of 07/26/2024.     Review of Systems  Review of Systems  N/a Physical Exam  BP (!) 148/70   Pulse (!) 58   Temp 97.9 F (36.6 C)   Ht 4' 11.5 (1.511 m)   Wt 134 lb (60.8 kg)   SpO2 94% Comment: ra  BMI 26.61 kg/m   Wt Readings from Last 5 Encounters:  07/26/24 134 lb (60.8 kg)  07/02/24 132 lb (59.9 kg)  04/04/24 134 lb 12.8 oz (61.1 kg)  04/02/24 135 lb 6.4 oz (61.4 kg)  02/13/24 132 lb 9.6 oz (60.1 kg)    BMI Readings from Last 5 Encounters:  07/26/24 26.61 kg/m  07/02/24 26.21 kg/m  04/04/24 26.77 kg/m  04/02/24 26.89 kg/m  02/13/24 26.78 kg/m     Physical Exam General: Well-appearing, sitting in chair Eyes: EOMI, no icterus Neck: Supple, no JVP Pulmonary: Clear, no wheeze no crackles, normal work of breathing Cardiovascular warm, no edema Abdomen: Nondistended, bowel sounds present MSK: No synovitis, no joint effusion Neuro: Normal gait, no weakness Psych: Normal mood, full affect   Assessment & Plan:   Dyspnea on exertion: Likely multifactorial related to cardiac issues, worsening diastology with exercise given significant left atrial  dilation.  Also suspect large contribution from emphysema.  Severe emphysema on imaging.  Continue Breztri , albuterol  as needed.  Continue cardiology follow-up.  PFTs normal with exception of decreased DLCO due to emphysema.  Nocturnal hypoxemia: Unlikely to improve.  Repeat over the oximetry fall 2024 with ongoing oxygen  need.  Advised continued use of oxygen .  Chronic cough: Related to emphysema.  Chronic bronchitis, asthma.  Continue Breztri .  Okay for intermittent courses of narcotic cough syrup given significant and severe coughing fits.  Overall improved with initiation of thrice weekly azithromycin .  Preoperative evaluation: Pulmonary medicine does not provide preoperative clearance or other preoperative risk assessment.  Based on the ARISCAT model, patient is low or 1.6% risk of postoperative pulmonary complication if duration of surgery is less than 2 hours.  Duration of surgery is greater than 2 hours, patient is intermediate to 13.3% risk of postoperative pulmonary complication.   Return in about 4 months (around 11/25/2024).   Megan JONELLE Beals, MD 07/26/2024

## 2024-07-30 ENCOUNTER — Encounter: Payer: Self-pay | Admitting: Cardiology

## 2024-07-30 ENCOUNTER — Telehealth: Payer: Self-pay

## 2024-07-30 ENCOUNTER — Ambulatory Visit: Attending: Cardiology | Admitting: Cardiology

## 2024-07-30 VITALS — BP 122/42 | HR 73 | Ht 59.0 in | Wt 135.6 lb

## 2024-07-30 DIAGNOSIS — R0789 Other chest pain: Secondary | ICD-10-CM | POA: Diagnosis not present

## 2024-07-30 DIAGNOSIS — I10 Essential (primary) hypertension: Secondary | ICD-10-CM

## 2024-07-30 DIAGNOSIS — I25708 Atherosclerosis of coronary artery bypass graft(s), unspecified, with other forms of angina pectoris: Secondary | ICD-10-CM | POA: Diagnosis not present

## 2024-07-30 NOTE — Telephone Encounter (Signed)
 Anesthesiologist is requesting copy of EKG and ECHO results. [facility] has faxed over the clearance request form from their fax number: [phone]. Please advise.

## 2024-07-30 NOTE — Telephone Encounter (Signed)
Both have been faxed. 

## 2024-07-30 NOTE — Telephone Encounter (Signed)
   Primary Cardiologist: Peter Swaziland, MD  Chart reviewed as part of pre-operative protocol coverage. Given past medical history and time since last visit, based on ACC/AHA guidelines, Megan [name] would be at acceptable risk for the planned procedure without further cardiovascular testing.   Patient should contact our office if she is having new symptoms that are concerning from a cardiac perspective to arrange a follow-up appointment.    Per primary cardiologist, Dr. Swaziland, she may hold Prasugrel  for 7 days prior to procedure and resume when deemed hemodynamically stable postoperatively. Continue aspirin  without interruption throughout the perioperative process.   I will route this recommendation to the requesting party via Epic fax function and remove from pre-op pool.  Please call with questions.  [name], NP-C 07/30/2024, 2:47 PM [address] [place], [place] [address] Office [phone] Fax [phone]

## 2024-07-30 NOTE — Patient Instructions (Signed)
 Medication Instructions:  Continue same medications *If you need a refill on your cardiac medications before your next appointment, please call your pharmacy*  Lab Work: None ordered  Testing/Procedures: None ordered  Follow-Up: At [facility], you and your health needs are our priority.  As part of our continuing mission to provide you with exceptional heart care, our providers are all part of one team.  This team includes your primary Cardiologist (physician) and Advanced Practice Providers or APPs (Physician Assistants and Nurse Practitioners) who all work together to provide you with the care you need, when you need it.  Your next appointment:  6 months   Call in Oct to schedule Feb appointment     Provider:  Dr.Jordan   We recommend signing up for the patient portal called MyChart.  Sign up information is provided on this After Visit Summary.  MyChart is used to connect with patients for Virtual Visits (Telemedicine).  Patients are able to view lab/test results, encounter notes, upcoming appointments, etc.  Non-urgent messages can be sent to your provider as well.   To learn more about what you can do with MyChart, go to [email or web address].

## 2024-07-30 NOTE — Telephone Encounter (Signed)
   Pre-operative Risk Assessment    Patient Name: Megan [name]  DOB: May 01, 1946 MRN: [id number]   Date of last office visit: 07/30/24 PETER SWAZILAND, MD Date of next office visit: NONE   Request for Surgical Clearance    Procedure:  BILATERAL LOWER EYELID BLEPHAROPLASTY  Date of Surgery:  Clearance 08/02/24                                Surgeon:  DR OSBORNE PONDER Surgeon's Group or Practice Name:  [facility] Phone number:  [phone] Fax number:  [phone]   Type of Clearance Requested:   - Medical  - Pharmacy:  Hold Aspirin  and Prasugrel  (Effient )     Type of Anesthesia:  MOBILE   Additional requests/questions:  COPY OF EKG AND ECHO  Signed, Lucie DELENA Ku   07/30/2024, 2:13 PM

## 2024-07-31 ENCOUNTER — Other Ambulatory Visit: Payer: Self-pay | Admitting: Cardiology

## 2024-08-01 ENCOUNTER — Telehealth: Payer: Self-pay | Admitting: Pulmonary Disease

## 2024-08-01 DIAGNOSIS — M48061 Spinal stenosis, lumbar region without neurogenic claudication: Secondary | ICD-10-CM | POA: Diagnosis not present

## 2024-08-01 DIAGNOSIS — M797 Fibromyalgia: Secondary | ICD-10-CM | POA: Diagnosis not present

## 2024-08-01 NOTE — Telephone Encounter (Signed)
 Fax received from Dr. Renzo Zaldivar with [facility] to perform a bilateral lower eyelid blepharoplasty on patient.  Patient needs surgery clearance. Surgery is pending. Patient was seen on 07/07/24. Office protocol is a risk assessment can be sent to surgeon if patient has been seen in 60 days or less.   Risk assessment done 08/07/24 was faxed to [facility] [phone].

## 2024-09-03 ENCOUNTER — Telehealth: Payer: Self-pay

## 2024-09-03 NOTE — Telephone Encounter (Signed)
 Copied from CRM #[id number]. Topic: General - Other >> Sep 03, 2024 10:26 AM Franky GRADE wrote: Reason for CRM: Patient's calling because she needs for a Handicap parking form to be completed, she had to change the title of her car due to husband passing and does not have a tag to go with the new plate. Please call patient when form is available for pick up.  Per verbal discussion with PCP, ok to provide permanent handicap placard for patient. Completed form and advised patient via lvm form in front office for pick up at her convenience.

## 2024-09-04 ENCOUNTER — Telehealth: Payer: Self-pay | Admitting: Family Medicine

## 2024-09-04 NOTE — Telephone Encounter (Signed)
 Type of form received:Application For Disability License Plate  Additional comments:   Received ab:Opwij  Form should be Faxed to:N/A  Form should be mailed to:  N/A  Is patient requesting call for pickup: Yes   Form placed:  In Provider Folder  Attach charge sheet. Yes  Individual made aware of 3-5 business day turn around (Y/N)?

## 2024-09-05 NOTE — Telephone Encounter (Signed)
Form has been placed in providers box for completion

## 2024-09-06 NOTE — Telephone Encounter (Signed)
 LVM for pt to let her know that form has been placed up front at check in

## 2024-09-10 ENCOUNTER — Other Ambulatory Visit: Payer: Self-pay | Admitting: Pediatrics

## 2024-09-11 NOTE — Telephone Encounter (Signed)
 I spoke to Megan [name] and verified that she did request a change to her omeprazole  Rx.  She said that she has been taking the medication twice a day because once a day was not helping.  Dr. Suzann is it ok to give her a refill for twice a day or does she need to be seen?  I scheduled her with an APP on 10/9 but I wanted to make sure it is necessary for her to come in for the change.  Megan [name].

## 2024-09-12 ENCOUNTER — Telehealth: Payer: Self-pay

## 2024-09-12 NOTE — Telephone Encounter (Signed)
 PAP: Patient assistance application for Breztri  through [employer] ([employer]) has been mailed to pt's home address on file. Provider portion of application will be faxed to provider's office. For renewal 2026

## 2024-09-13 ENCOUNTER — Ambulatory Visit: Admitting: Gastroenterology

## 2024-09-13 ENCOUNTER — Encounter: Payer: Self-pay | Admitting: Gastroenterology

## 2024-09-13 VITALS — BP 110/50 | HR 86 | Ht 59.5 in | Wt 132.0 lb

## 2024-09-13 DIAGNOSIS — Z7982 Long term (current) use of aspirin: Secondary | ICD-10-CM | POA: Diagnosis not present

## 2024-09-13 DIAGNOSIS — Z79899 Other long term (current) drug therapy: Secondary | ICD-10-CM | POA: Diagnosis not present

## 2024-09-13 DIAGNOSIS — K5900 Constipation, unspecified: Secondary | ICD-10-CM

## 2024-09-13 DIAGNOSIS — K219 Gastro-esophageal reflux disease without esophagitis: Secondary | ICD-10-CM

## 2024-09-13 NOTE — Progress Notes (Signed)
 Chief Complaint: follow-up Primary GI Doctor: Dr. Suzann  Patient Profile: Megan [name] is a 78 y.o. female who returns to the [facility] for follow-up of the problem(s) noted below.   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 Gudino was last seen in the GI office 12/26/23 by Dr. Suzann.  Current GI Meds  Omeprazole  40 mg orally daily MiraLAX    Interval History    Patient presents for follow-up on GERD. She reports she was taking omeprazole  40 mg po twice daily many years ago and recently was decreased down to once daily which she reports did not control her symptoms.  Patient presents today to request to go back to PPI therapy twice daily which controlled her symptoms.  At the last follow-up Dr. Suzann had recommended bone density scan which patient states she last had done many years ago.  Patient states she was recommended to start on osteopenia medication however declined.  Patient verbalizes understanding of continuing long-term PPI use.     She also wanted to discuss constipation with stool leakage. She was encouraged at last appointment to start Miralax  and fiber supplementation to help with overflow, which she admits she has not done. She agreed to revisit this again.   Wt Readings from Last 3 Encounters:  09/13/24 132 lb (59.9 kg)  07/30/24 135 lb 9.6 oz (61.5 kg)  07/26/24 134 lb (60.8 kg)    Past Medical History:  Diagnosis Date   AAA (abdominal aortic aneurysm)    a. s/p stent grafting in 2009.   Adenomatous colon polyp 05/2001   Adenomatous duodenal polyp    Allergy    Anginal pain    COPD (chronic obstructive pulmonary disease) (HCC)    pt denies COPD   Coronary artery disease    a. s/p CABG in 1995;  b. 05/2013 Neg MV, EF 82%;  c. 06/2013  Cath: LM nl, LAD 40-50p, LCX nl, RCA 80p/123m, VG->RCA->PDA->PLSA min irregs, LIMA->LAD atretic, vigorous LV fxn.   Diabetes mellitus without complication (HCC)    history of, resolved. 2017   Diverticulosis    Eczema, dyshidrotic    Eczema, dyshidrotic 1986   Dr. Prentice Mayhew   Emphysema of lung [facility])    Fatty liver    Fibromyalgia    GERD (gastroesophageal reflux disease)    Hyperlipidemia    Hypertension    Migraine    Myocardial infarction [facility])    Osteoarthritis    Osteoporosis    unsure, having a bone scan soon   Peripheral arterial disease    left leg diagnosed by Dr Dominick   Renal artery stenosis    a. 2008 s/p PTA    Past Surgical History:  Procedure Laterality Date   ABDOMINAL AORTAGRAM N/A 03/06/2014   Procedure: ABDOMINAL AORTAGRAM;  Surgeon: Krystal JULIANNA Doing, MD;  Location: [facility] CATH LAB;  Service: Cardiovascular;  Laterality: N/A;   ABDOMINAL AORTIC ANEURYSM REPAIR  2009   stenting   ABDOMINAL HYSTERECTOMY     Total, 1992   BREAST BIOPSY     CARDIAC CATHETERIZATION     CARDIOVASCULAR STRESS TEST  11/11/2009   normal study   CARPAL TUNNEL RELEASE  08/1993   left wrist   CARPAL TUNNEL RELEASE  01/1997   right   CATARACT EXTRACTION, BILATERAL  2021   CORONARY ARTERY BYPASS  GRAFT  07/1994   Triple by Dr Katherene Jude   CORONARY STENT INTERVENTION N/A 02/02/2021   Procedure: CORONARY STENT INTERVENTION;  Surgeon: Mady Bruckner, MD;  Location: [facility] INVASIVE CV LAB;  Service: Cardiovascular;  Laterality: N/A;   CORONARY STENT INTERVENTION N/A 06/26/2021   Procedure: CORONARY STENT INTERVENTION;  Surgeon: Wonda Sharper, MD;  Location: [facility] INVASIVE CV LAB;  Service: Cardiovascular;  Laterality: N/A;   CORONARY ULTRASOUND/IVUS N/A 06/26/2021   Procedure: Intravascular Ultrasound/IVUS;  Surgeon: Wonda Sharper, MD;  Location: [facility] INVASIVE CV LAB;  Service: Cardiovascular;  Laterality: N/A;   CORONARY ULTRASOUND/IVUS N/A 09/03/2021   Procedure: Intravascular Ultrasound/IVUS;   Surgeon: Wendel Lurena POUR, MD;  Location: [facility] INVASIVE CV LAB;  Service: Cardiovascular;  Laterality: N/A;   dental implants     EYE SURGERY  03/2020   cataract   HAND SURGERY Right 09/2002   Right hand pulley release   LAMINECTOMY WITH POSTERIOR LATERAL ARTHRODESIS LEVEL 1 N/A 03/24/2022   Procedure: LAMINECTOMY, FORAMINOTOMY, LUMBAR TWO- THREE ; INTERTRANSVERSE FUSION LUMBAR TWO - LUMBAR THREE RIGHT;  Surgeon: Joshua Alm RAMAN, MD;  Location: [facility] OR;  Service: Neurosurgery;  Laterality: N/A;   LEFT HEART CATH AND CORONARY ANGIOGRAPHY N/A 06/26/2021   Procedure: LEFT HEART CATH AND CORONARY ANGIOGRAPHY;  Surgeon: Wonda Sharper, MD;  Location: [facility] INVASIVE CV LAB;  Service: Cardiovascular;  Laterality: N/A;   LEFT HEART CATH AND CORS/GRAFTS ANGIOGRAPHY N/A 02/02/2021   Procedure: LEFT HEART CATH AND CORS/GRAFTS ANGIOGRAPHY;  Surgeon: Mady Bruckner, MD;  Location: [facility] INVASIVE CV LAB;  Service: Cardiovascular;  Laterality: N/A;   LEFT HEART CATH AND CORS/GRAFTS ANGIOGRAPHY N/A 09/03/2021   Procedure: LEFT HEART CATH AND CORS/GRAFTS ANGIOGRAPHY;  Surgeon: Wendel Lurena POUR, MD;  Location: [facility] INVASIVE CV LAB;  Service: Cardiovascular;  Laterality: N/A;   LEFT HEART CATH AND CORS/GRAFTS ANGIOGRAPHY N/A 07/15/2022   Procedure: LEFT HEART CATH AND CORS/GRAFTS ANGIOGRAPHY;  Surgeon: Swaziland, Peter M, MD;  Location: [facility] INVASIVE CV LAB;  Service: Cardiovascular;  Laterality: N/A;   LEFT HEART CATHETERIZATION WITH CORONARY/GRAFT ANGIOGRAM N/A 06/11/2013   Procedure: LEFT HEART CATHETERIZATION WITH EL BILE;  Surgeon: Aleene JINNY Passe, MD;  Location: [facility] CATH LAB;  Service: Cardiovascular;  Laterality: N/A;   MULTIPLE TOOTH EXTRACTIONS  2000   All upper teeth.  Has full dneture.    RENAL ARTERY STENT  2008   SHOULDER ARTHROSCOPY WITH ROTATOR CUFF REPAIR Right 09/27/2018   Procedure: Right shoulder mini open rotator cuff repair;  Surgeon: Duwayne Purchase, MD;  Location: [facility] ORS;  Service: Orthopedics;   Laterality: Right;  90 mins   thyroid  cyst apiration  05/1997 and 07/1998   TONSILECTOMY, ADENOIDECTOMY, BILATERAL MYRINGOTOMY AND TUBES  1989   TONSILLECTOMY     TOTAL ABDOMINAL HYSTERECTOMY  1992   Social History    Social History  Ms. Kiger resides at a senior living community - [place] Her husband has progressive supranuclear palsy No children  Current Outpatient Medications  Medication Sig Dispense Refill   Accu-Chek FastClix Lancets MISC Check blood sugar once daily PRN 100 each 1   albuterol  (PROAIR  HFA) 108 (90 Base) MCG/ACT inhaler Inhale 2 puffs into the lungs every 6 (six) hours as needed for wheezing or shortness of breath. 1 each 11   albuterol  (PROVENTIL ) (2.5 MG/3ML) 0.083% nebulizer solution Take 3 mLs (2.5 mg total) by nebulization every 6 (six) hours as needed for wheezing or shortness of breath. 360 mL 5   amLODipine  (NORVASC ) 5 MG tablet Take 1/2 tablet (  2.5 mg ) daily     aspirin  EC 81 MG tablet Take 1 tablet (81 mg total) by mouth daily. 90 tablet 3   atorvastatin  (LIPITOR ) 80 MG tablet TAKE 1 TABLET BY MOUTH EVERY DAY 90 tablet 3   azithromycin  (ZITHROMAX ) 250 MG tablet Take 1 tablet (250 mg total) by mouth 3 (three) times a week. Monday Wednesday Friday 60 tablet 2   B Complex-Biotin-FA (B-100 COMPLEX PO) Take 1 tablet by mouth every evening.     bismuth subsalicylate (PEPTO BISMOL) 262 MG/15ML suspension Take 30 mLs by mouth every 6 (six) hours as needed for indigestion or diarrhea or loose stools.     budesonide-glycopyrrolate -formoterol  (BREZTRI  AEROSPHERE) 160-9-4.8 MCG/ACT AERO inhaler Inhale 2 puffs into the lungs in the morning and at bedtime. 3 each 3   cholecalciferol  (VITAMIN D3) 25 MCG (1000 UNIT) tablet Take 1,000 Units by mouth every evening.     cycloSPORINE  (RESTASIS ) 0.05 % ophthalmic emulsion Place 1 drop into both eyes 2 (two) times daily.      ezetimibe  (ZETIA ) 10 MG tablet TAKE 1 TABLET BY MOUTH EVERY DAY 90 tablet 3   FLUZONE HIGH-DOSE 0.5 ML  injection      gabapentin  (NEURONTIN ) 300 MG capsule Take 1 capsule (300 mg total) by mouth 2 (two) times daily. 270 capsule 3   glucose blood (ACCU-CHEK SMARTVIEW) test strip CHECK BLOOD SUGAR ONCE DAILY AS NEEDED 100 strip 1   guaiFENesin  (MUCINEX  PO) Take by mouth. 1200mg  twice daily     HYDROcodone  bit-homatropine (HYCODAN) 5-1.5 MG/5ML syrup Take 5 mLs by mouth every 6 (six) hours as needed for cough. 240 mL 0   isosorbide  mononitrate (IMDUR ) 30 MG 24 hr tablet TAKE 3 TABLETS ( 90 MG ) EVERY MORNING AND 1 TABLET (30 MG ) EVERY EVENING 360 tablet 2   losartan  (COZAAR ) 100 MG tablet TAKE 1 TABLET BY MOUTH EVERY DAY 90 tablet 3   metoprolol  succinate (TOPROL -XL) 25 MG 24 hr tablet TAKE 1 TABLET (25 MG TOTAL) BY MOUTH DAILY. 90 tablet 3   Multiple Vitamins-Minerals (MULTIVITAMIN WITH MINERALS) tablet Take 1 tablet by mouth every evening.     nitroGLYCERIN  (NITROSTAT ) 0.4 MG SL tablet PLACE 1 TABLET UNDER THE TONGUE EVERY 5 MINUTES AS NEEDED FOR CHEST PAIN. 25 tablet 11   omeprazole  (PRILOSEC) 40 MG capsule Take 1 capsule (40 mg total) by mouth in the morning and at bedtime. Patient has to be seen in office for additional refills. 60 capsule 0   ondansetron  (ZOFRAN ) 8 MG tablet Take 1 tablet (8 mg total) by mouth every 6 (six) hours as needed for nausea or vomiting. 20 tablet 0   OVER THE COUNTER MEDICATION [facility] stool softener daily     oxyCODONE -acetaminophen  (PERCOCET) 10-325 MG tablet Take 1 tablet by mouth in the morning and at bedtime. Can take one tablet three times a day as needed for back pain     polyethylene glycol (MIRALAX  / GLYCOLAX ) 17 g packet Take 17 g by mouth daily as needed for moderate constipation.     prasugrel  (EFFIENT ) 5 MG TABS tablet TAKE 1 TABLET (5 MG TOTAL) BY MOUTH DAILY. 90 tablet 2   PRESCRIPTION MEDICATION Place 2 puffs into both ears daily as needed (ear infections). [facility] ear powder     Simethicone  (GAS-X PO) Take 1-2 tablets by mouth daily as needed (gas).      SPIKEVAX syringe      torsemide  (DEMADEX ) 20 MG tablet Take 1 tablet (20 mg total)  by mouth daily. Take 1/2 tablet daily as needed for swelling 90 tablet 3   tretinoin  (RETIN-A ) 0.1 % cream Apply 1 application. topically every evening.     No current facility-administered medications for this visit.    Allergies as of 09/13/2024 - Review Complete 09/13/2024  Allergen Reaction Noted   Atorvastatin  Other (See Comments) 09/20/2013   Brilinta  [ticagrelor ] Shortness Of Breath 08/31/2021   Bextra [valdecoxib] Other (See Comments) 05/14/2011   Hydrocod poli-chlorphe poli er Rash 02/25/2016   Mevacor [lovastatin] Other (See Comments) 05/14/2011   Plavix  [clopidogrel  bisulfate] Itching and Other (See Comments) 05/08/2015   Zocor [simvastatin] Other (See Comments) 05/14/2011   Doxycycline  Diarrhea and Nausea And Vomiting 06/12/2014   Metoprolol  Other (See Comments) 01/17/2013   Morphine  and codeine  Itching and Nausea Only 08/17/2011   Codeine  Itching and Rash 09/08/2009   Lisinopril Cough 05/14/2011    Family History  Problem Relation Age of Onset   Heart disease Mother    Heart attack Mother    Heart disease Father        before age 22   Heart attack Father    Heart disease Sister        before age 35   Diabetes Sister    Hyperlipidemia Sister    Hypertension Sister    Heart attack Sister    Liver cancer Sister    Breast cancer Sister    Heart disease Brother    Diabetes Brother    Hyperlipidemia Brother    Hypertension Brother    Heart attack Brother    AAA (abdominal aortic aneurysm) Brother    Colon cancer Paternal Grandmother    Heart disease Brother    Colon cancer Cousin        first cousin on father's side   Pancreatic cancer Neg Hx    Rectal cancer Neg Hx    Stomach cancer Neg Hx    Esophageal cancer Neg Hx     Review of Systems:    Constitutional: No weight loss, fever, chills, weakness or fatigue HEENT: Eyes: No change in vision               Ears, Nose,  Throat:  No change in hearing or congestion Skin: No rash or itching Cardiovascular: No chest pain, chest pressure or palpitations   Respiratory: No SOB or cough Gastrointestinal: See HPI and otherwise negative Genitourinary: No dysuria or change in urinary frequency Neurological: No headache, dizziness or syncope Musculoskeletal: No new muscle or joint pain Hematologic: No bleeding or bruising Psychiatric: No history of depression or anxiety    Physical Exam:  Vital signs: BP (!) 102/54   Pulse 86   Ht 4' 11.5 (1.511 m)   Wt 132 lb (59.9 kg)   BMI 26.21 kg/m   Constitutional:   Pleasant  female appears to be in NAD, Well developed, Well nourished, alert and cooperative Throat: Oral cavity and pharynx without inflammation, swelling or lesion.  Respiratory: Respirations even and unlabored. Lungs clear to auscultation bilaterally.   No wheezes, crackles, or rhonchi.  Cardiovascular: Normal S1, S2. Regular rate and rhythm. No peripheral edema, cyanosis or pallor.  Gastrointestinal:  Soft, nondistended, nontender. No rebound or guarding. Normal bowel sounds. No appreciable masses or hepatomegaly. Rectal:  Not performed.  Msk:  Symmetrical without gross deformities. Without edema, no deformity or joint abnormality.  Neurologic:  Alert and  oriented x4;  grossly normal neurologically.  Skin:   Dry and intact without significant lesions or rashes.  RELEVANT LABS AND IMAGING: CBC    Latest Ref Rng & Units 07/25/2024    8:07 AM 02/17/2023   10:26 AM 12/22/2022   11:10 AM  CBC  WBC 3.4 - 10.8 x10E3/uL 7.7  15.0  10.5   Hemoglobin 11.1 - 15.9 g/dL 85.6  85.5  85.6   Hematocrit 34.0 - 46.6 % 44.9  41.7  41.0   Platelets 150 - 450 x10E3/uL 191  259.0  233.0      CMP     Latest Ref Rng & Units 07/25/2024    8:07 AM 02/09/2024    8:17 AM 08/25/2023   12:00 AM  CMP  Glucose 70 - 99 mg/dL 88  94  886   BUN 8 - 27 mg/dL 14  9  11    Creatinine 0.57 - 1.00 mg/dL 9.31  9.27  9.25   Sodium  134 - 144 mmol/L 139  141  136   Potassium 3.5 - 5.2 mmol/L 4.6  4.7  4.1   Chloride 96 - 106 mmol/L 104  105  94   CO2 20 - 29 mmol/L 21  25  29    Calcium  8.7 - 10.3 mg/dL 9.4  89.9  89.9   Total Protein 6.0 - 8.5 g/dL 5.8  5.6  6.1   Total Bilirubin 0.0 - 1.2 mg/dL 0.5  0.8  0.6   Alkaline Phos 44 - 121 IU/L 74  93  73   AST 0 - 40 IU/L 23  22  20    ALT 0 - 32 IU/L 27  16  23       Lab Results  Component Value Date   TSH 0.97 11/07/2023   Imaging Studies  CT angio abdomen and pelvis with and without contrast 07/15/2023 VASCULAR   Aorta: Mild atherosclerotic changes of the distal thoracic aorta. Diameter at the hiatus estimated 2.3 cm.   Mild to moderate atherosclerotic changes of the abdominal aorta.   Redemonstration of endovascular repair abdominal aortic aneurysm. Stent graft device appears to have suprarenal fixation, possible early generation cook or Medtronic device. No evidence of distal migration.   No endo luminal thrombus/plaque. The right and left iliac limb remain patent.   The left-sided stent extension to the external iliac artery remains patent without evidence of in stent stenosis.   No periaortic fluid or inflammatory changes.   The diameter of the excluded aneurysm sac is unchanged from the comparison of 09/18/2021. Estimated 4 cm. Late phase imaging demonstrates no endoleak.   Celiac: Calcified plaque at the origin of the celiac artery with 50% or less stenosis. Branches are patent.   SMA: Atherosclerotic changes at the SMA origin with at least 50% narrowing on the sagittal images.   Renals:   - Right: Single right renal artery. Stent remains patent without in stent stenosis.   - Left: There are 2 left renal arteries. Both remain patent. Evaluation of the lower renal artery origin is limited by the presence of the suprarenal fixation.   IMA: Excluded by the endograft.   Right lower extremity:   Right limb remains patent terminating in the  common iliac artery. Tortuosity of the common iliac artery with mild atherosclerosis. No evidence of high-grade stenosis.   Hypogastric artery is patent.   External iliac artery with mild atherosclerotic changes.  Patent.   Common femoral artery patent with minimal atherosclerosis.   Proximal profunda femoris and SFA patent.   Left lower extremity:   Left iliac limb remains patent. The stent extension  remains patent without evidence of in stent stenosis into the external iliac artery.   The origin of the hypogastric artery appears occluded with some collateral filling of pelvic arteries.   Tortuosity of the external iliac artery with mild atherosclerotic changes. No evidence of high-grade stenosis.   Common femoral artery patent with minimal atherosclerosis.   Proximal profunda femoris and SFA patent.   Veins: Portal system:   Hepatic veins unremarkable.   Portal vein, splenic vein, superior mesenteric vein and inferior mesenteric vein patent.   Systemic venous:   Unremarkable IVC and iliac veins.   Nutcracker configuration of the left renal vein with some engorged collateral paraspinous drainage into the hemi azygous system.   Review of the MIP images confirms the above findings.   NON-VASCULAR   Lower chest: No acute.   Hepatobiliary: Unremarkable appearance of the liver. Unremarkable gall bladder.   Pancreas: Unremarkable.   Spleen: Calcified foci of the spleen most likely prior granulomatous disease.   Adrenals/Urinary Tract:   - Right adrenal gland: Unremarkable   - Left adrenal gland: Unremarkable.   - Right kidney: No hydronephrosis, nephrolithiasis, inflammation, or ureteral dilation. No focal lesion.   - Left Kidney: No hydronephrosis, nephrolithiasis, inflammation, or ureteral dilation. No focal lesion.   - Urinary Bladder: Developing ureterocele, otherwise unremarkable.   Stomach/Bowel:   - Stomach: Unremarkable.   - Small bowel:  Unremarkable   - Appendix: Normal.   - Colon: Transverse and left-sided colonic diverticular disease. No acute inflammation. No significant stool burden.   Lymphatic: No adenopathy.   Mesenteric: No free fluid or air. No mesenteric adenopathy.   Reproductive: Hysterectomy   Other: No hernia.   Musculoskeletal: Surgical changes posterior lumbar interbody fusion with bilateral pedicle screw and rod fixation spanning L3-L5 with interbody spacer placement. Alignment unchanged from the prior.   No bony canal narrowing.   No displaced fracture.  Degenerative changes of the hips.   IMPRESSION: Redemonstration of EVAR for repair of infrarenal abdominal aortic aneurysm, with no evidence of endoleak or other late complicating features.   Patent left iliac stent extension without in stent stenosis.   Patent right renal artery stent without evidence of in stent stenosis.   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 Aortic Atherosclerosis (ICD10-I70.0).   Additional ancillary findings as above.     GI Procedures and Studies  EGD/colonoscopy 11/05/2019 EGD -gastritis, multiple gastric polyps, HH, duodenal polyps Colonoscopy - two 5 to 6 mm polyps in the rectum and sigmoid colon, diffuse diverticulosis, small ascending colon AVM Path: Benign upper GI path, hyperplastic colon polyp   EGD/colonoscopy 09/19/2014 EGD -gastritis, nodularities in the duodenal bulb, HH Colonoscopy -6 mm sessile rectal polyp, diffuse diverticulosis Path: Gastric mucosal changes related to PPI, hyperplastic colon polyp   EGD 08/07/2013 6 mm sessile polyp in duodenal bulb Path: Duodenal adenoma   EGD 07/24/2013 Gastritis, HH, duodenal bulb nodule Path: Adenomatous tissue in duodenum   Colonoscopy 09/17/2009 Moderate diverticulosis in left colon   Colonoscopy 06/22/2004 4 mm  sigmoid colon polyp, diverticulosis in sigmoid colon, 1 AVM in cecum Path: Lymphoid aggregate   EGD/colonoscopy 05/17/2001 EGD -normal Colonoscopy -multiple small polyps, sigmoid diverticulosis Path: Tubular adenoma and hyperplastic polyp  Assessment: Encounter Diagnoses  Name Primary?   Gastroesophageal reflux disease, unspecified whether esophagitis present Yes   Constipation, unspecified constipation type      78 year old female patient that presents  today for follow-up of GERD as well as chronic constipation.  Patient states once daily omeprazole  does not control her symptoms therefore we will go back to her twice daily.  Patient aware of long-term use of PPI therapy.  Again recommended bone density scan.  We also revisited her concern related to constipation and intermittent fecal soiling.  I had reviewed with her the concept of overflow incontinence and using the MiraLAX  and stool softeners on a regular basis to help prevent these episodes.  We also discussed high-fiber diet.  Patient will try her best to follow these.  Plan: -Omeprazole  40 mg twice daily -Yurika Pereda use stool softeners and MiraLAX  on a as needed basis.  Suggested taking on a regular -basis if fecal soiling is occurring. -Reinforced fiber supplementation - Follow-up in 1 year and as needed   Thank you for the courtesy of this consult. Please call me with any questions or concerns.   Yusef Lamp, FNP-C  Gastroenterology 09/13/2024, 10:37 AM  Cc: Jodie Lavern CROME, MD

## 2024-09-13 NOTE — Patient Instructions (Addendum)
 GERD Recommend GERD diet Continue omeprazole  40 mg twice daily , refilled  Stool leakage Recommend high fiber diet May try otc psyllium husk 1 tsp po daily May use stool softeners and MiraLAX  on an as-needed basis.  _______________________________________________________  If your blood pressure at your visit was 140/90 or greater, please contact your primary care physician to follow up on this.  _______________________________________________________  If you are age 80 or older, your body mass index should be between 23-30. Your Body mass index is 26.21 kg/m. If this is out of the aforementioned range listed, please consider follow up with your Primary Care Provider.  If you are age 21 or younger, your body mass index should be between 19-25. Your Body mass index is 26.21 kg/m. If this is out of the aformentioned range listed, please consider follow up with your Primary Care Provider.   ________________________________________________________  The [facility] providers would like to encourage you to use MYCHART to communicate with providers for non-urgent requests or questions.  Due to long hold times on the telephone, sending your provider a message by [facility] may be a faster and more efficient way to get a response.  Please allow 48 business hours for a response.  Please remember that this is for non-urgent requests.  _______________________________________________________  [facility] is using a team-based approach to care.  Your team is made up of your doctor and two to three APPS. Our APPS (Nurse Practitioners and Physician Assistants) work with your physician to ensure care continuity for you. They are fully qualified to address your health concerns and develop a treatment plan. They communicate directly with your gastroenterologist to care for you. Seeing the Advanced Practice Practitioners on your physician's team can help you by facilitating care more promptly, often  allowing for earlier appointments, access to diagnostic testing, procedures, and other specialty referrals.   Thank you for trusting me with your gastrointestinal care. Deanna May, FNP-C

## 2024-09-14 MED ORDER — OMEPRAZOLE 40 MG PO CPDR: 40.0000 mg | DELAYED_RELEASE_CAPSULE | Freq: Two times a day (BID) | ORAL | 3 refills | Status: AC

## 2024-09-14 NOTE — Addendum Note (Signed)
 Addended by: WILLIEMAE JOLA PARAS on: 09/14/2024 09:38 AM   Modules accepted: Orders

## 2024-09-18 DIAGNOSIS — H04123 Dry eye syndrome of bilateral lacrimal glands: Secondary | ICD-10-CM | POA: Diagnosis not present

## 2024-09-24 ENCOUNTER — Other Ambulatory Visit (HOSPITAL_COMMUNITY): Payer: Self-pay

## 2024-09-24 NOTE — Telephone Encounter (Signed)
 PAP: Application for Markus Daft has been submitted to [employer] ([employer]), via fax

## 2024-09-24 NOTE — Telephone Encounter (Signed)
 Received provider portion PAP application for [employer]  ([employer])

## 2024-10-04 DIAGNOSIS — H04123 Dry eye syndrome of bilateral lacrimal glands: Secondary | ICD-10-CM | POA: Diagnosis not present

## 2024-10-04 DIAGNOSIS — T1512XA Foreign body in conjunctival sac, left eye, initial encounter: Secondary | ICD-10-CM | POA: Diagnosis not present

## 2024-10-04 DIAGNOSIS — H02054 Trichiasis without entropian left upper eyelid: Secondary | ICD-10-CM | POA: Diagnosis not present

## 2024-10-04 DIAGNOSIS — H1132 Conjunctival hemorrhage, left eye: Secondary | ICD-10-CM | POA: Diagnosis not present

## 2024-10-09 ENCOUNTER — Other Ambulatory Visit (HOSPITAL_BASED_OUTPATIENT_CLINIC_OR_DEPARTMENT_OTHER): Payer: Self-pay | Admitting: Cardiology

## 2024-10-09 DIAGNOSIS — I25118 Atherosclerotic heart disease of native coronary artery with other forms of angina pectoris: Secondary | ICD-10-CM

## 2024-10-16 ENCOUNTER — Encounter: Payer: Self-pay | Admitting: Pulmonary Disease

## 2024-10-16 ENCOUNTER — Ambulatory Visit: Payer: Self-pay | Admitting: Pulmonary Disease

## 2024-10-16 ENCOUNTER — Ambulatory Visit: Admitting: Pulmonary Disease

## 2024-10-16 VITALS — BP 159/71 | HR 76 | Temp 97.4°F | Ht 59.0 in | Wt 131.0 lb

## 2024-10-16 DIAGNOSIS — J439 Emphysema, unspecified: Secondary | ICD-10-CM

## 2024-10-16 DIAGNOSIS — R0902 Hypoxemia: Secondary | ICD-10-CM

## 2024-10-16 DIAGNOSIS — J432 Centrilobular emphysema: Secondary | ICD-10-CM

## 2024-10-16 DIAGNOSIS — R053 Chronic cough: Secondary | ICD-10-CM | POA: Diagnosis not present

## 2024-10-16 DIAGNOSIS — R0609 Other forms of dyspnea: Secondary | ICD-10-CM

## 2024-10-16 DIAGNOSIS — Z87891 Personal history of nicotine dependence: Secondary | ICD-10-CM

## 2024-10-16 MED ORDER — HYDROCODONE BIT-HOMATROP MBR 5-1.5 MG/5ML PO SOLN: 5.0000 mL | Freq: Four times a day (QID) | ORAL | 0 refills | Status: AC | PRN

## 2024-10-16 MED ORDER — AMOXICILLIN-POT CLAVULANATE 875-125 MG PO TABS: 1.0000 | ORAL_TABLET | Freq: Two times a day (BID) | ORAL | 0 refills | Status: AC

## 2024-10-16 MED ORDER — PREDNISONE 20 MG PO TABS: ORAL_TABLET | ORAL | 0 refills | Status: AC

## 2024-10-16 NOTE — Telephone Encounter (Signed)
 FYI Only or Action Required?: FYI only for provider: appointment scheduled on today with pulm.  Patient was last seen in primary care on 07/02/2024 by Jodie Lavern CROME, MD.  Called Nurse Triage reporting Cough.  Symptoms began today.  Interventions attempted: Prescription medications: albuterol  and OTC delsym.  Symptoms are: unchanged.  Triage Disposition: See HCP Within 4 Hours (Or PCP Triage)  Patient/caregiver understands and will follow disposition?: Yes, will follow disposition  E2C2 Pulmonary Triage - Initial Assessment Questions "Chief Complaint (e.g., cough, sob, wheezing, fever, chills, sweat or additional symptoms) *Go to specific symptom protocol after initial questions Dry cough, sneeze, denies fever, sore throat  "How long have symptoms been present?" This AM  Have you tested for COVID or Flu? Note: If not, ask patient if a home test can be taken. If so, instruct patient to call back for positive results. No  MEDICINES:   "Have you used any OTC meds to help with symptoms?" yes If yes, ask "What medications?" Cough medicine  "Have you used your inhalers/maintenance medication?" Yes If yes, "What medications?" Albuterol  every 4-6 hours, 2 puffs Breztri  AM and PM once each  OXYGEN : "Do you wear supplemental oxygen ?" no If yes, "How many liters are you supposed to use?" Sleeping with 4 LPM at night, this is not normal for her  "Do you monitor your oxygen  levels?" Yes If yes, What is your reading (oxygen  level) today? 87 with activity HR 80  What is your usual oxygen  saturation reading?  (Note: Pulmonary O2 sats should be 90% or greater) 90-91  Copied from CRM #[id number]. Topic: Clinical - Red Word Triage >> Oct 16, 2024  1:54 PM Abigail D wrote: Red Word that prompted transfer to Nurse Triage: Shallow breathing + Sore throat: Patient is calling complaining of a lot of sneezing, coughing + runny nose. Not much production with the cough but is having a  horrible sore throat. Coughing is causing shallow breathing as well. Reason for Disposition  [1] MILD difficulty breathing (e.g., minimal/no SOB at rest, SOB with walking, pulse < 100) AND [2] still present when not coughing  Answer Assessment - Initial Assessment Questions 1. ONSET: When did the cough begin?      This AM 2. SEVERITY: How bad is the cough today?      Severe episodes 3. SPUTUM: Describe the color of your sputum (e.g., none, dry cough; clear, white, yellow, green)     Dry cough 5. DIFFICULTY BREATHING: Are you having difficulty breathing? If Yes, ask: How bad is it? (e.g., mild, moderate, severe)      mild 6. FEVER: Do you have a fever? If Yes, ask: What is your temperature, how was it measured, and when did it start?     99 8. LUNG HISTORY: Do you have any history of lung disease?  (e.g., pulmonary embolus, asthma, emphysema)     COPD, emphysema 10. OTHER SYMPTOMS: Do you have any other symptoms? (e.g., runny nose, wheezing, chest pain)       Cough, stuffy nose,  Protocols used: Cough - Acute Non-Productive-A-AH

## 2024-10-16 NOTE — Patient Instructions (Addendum)
 Nice to see you again  Take prednisone  and antibiotics as prescribed.  Sent a supply of cough syrup as well  Return to clinic in 4 months or sooner as needed with Dr. Annella

## 2024-10-17 ENCOUNTER — Encounter: Payer: Self-pay | Admitting: Pulmonary Disease

## 2024-10-17 NOTE — Progress Notes (Signed)
 @Patient  ID: Megan [name], female    DOB: 1946-05-17, 78 y.o.   MRN: [id number]  Chief Complaint  Patient presents with   Medical Management of Chronic Issues    Acute - last night sneezing,congestion, coughing     Referring provider: Jodie Lavern CROME, MD  HPI:   78 y.o. whom are seeing for acute visit for evaluation of cough productive green phlegm, wheezing, chills previously seen for evaluation of dyspnea on exertion related to emphysema and likely cardiac causes.  Most recent GI note reviewed.    Most recent cardiology note reviewed.  Overall doing okay.  Unfortunately began to feel ill yesterday.  A lot of sinus congestion.  Woke up with very sore throat.  She is concerned it is often turns into lower respiratory culture.  Coughing more starting last night as well into the day today.  HPI at initial visit: She was in her usual state of health.  She developed onset of shortness of breath a few weeks ago.  Different than baseline dyspnea.  Recurrent shorter distances.  Preceded with aches, sounds like viral infection.  Still some heart ache as well which is concerning to her given her early CAD and significant coronary history.  This prompted presentation to the ED.  There CTA PE protocol was negative for PE but showed presevere emphysematous changes with predilection for the upper lobes otherwise clear on my review and interpretation.  She subsequent underwent a left heart catheterization showed stable coronary artery disease and prior PCI and CABG.  This was reassuring.  In the interim she was placed on Breztri .  She flexes helped some with her shortness of breath.  Able to do more.  Still get a little bit winded on longer walks etc.  She used to walk a mile and a half a day.  She is working back to getting to that distance.  PMH: Tobacco abuse in remission, hypertension, CAD, asthma, hyperlipidemia, seasonal allergies Surgical history: Back surgery, CABG, PCI Family history: Mother and  father with CAD Social history: Former smoker, quit 1997, lives in [place] / Pulmonary Flowsheets:   ACT:      No data to display          MMRC:     No data to display          Epworth:      No data to display          Tests:   FENO:  No results found for: NITRICOXIDE  PFT:    Latest Ref Rng & Units 03/03/2023    1:20 PM  PFT Results  FVC-Pre L 1.85   FVC-Predicted Pre % 83   FVC-Post L 1.84   FVC-Predicted Post % 82   Pre FEV1/FVC % % 76   Post FEV1/FCV % % 76   FEV1-Pre L 1.41   FEV1-Predicted Pre % 85   FEV1-Post L 1.39   DLCO uncorrected ml/min/mmHg 9.07   DLCO UNC% % 55   DLCO corrected ml/min/mmHg 8.81   DLCO COR %Predicted % 53   DLVA Predicted % 67   TLC L 3.82   TLC % Predicted % 85   RV % Predicted % 92   Personally reviewed and interpreted as normal spirometry, lung volumes within normal limits, DLCO moderately reduced  WALK:     09/15/2023    3:16 PM 06/22/2023   10:29 AM 06/02/2023    4:34 PM 02/17/2023    4:46 PM  SIX MIN WALK  Supplimental Oxygen  during Test? (L/min)   Yes Yes  O2 Flow Rate   4 L/min 2 L/min  Type   Pulse Continuous  2 Minute Oxygen  Saturation % 90 % 88 %    2 Minute HR 89 80    4 Minute Oxygen  Saturation % 92 % 90 %    4 Minute HR 94 79    6 Minute Oxygen  Saturation % 90 % 91 %    6 Minute HR 98 80    Tech Comments:   Patient walked 2 laps on POC to see what pulsed level she needed to keep sats >88-90%.  She requires 4L pulsed to stay above 88-90%.  Tolerated walk well,. Patient SaO2 when brought back to exam room was 88% on room air.  Patient was immediately put on oxygen  2 lpm continuous.  After 5 minutes, SaO2 at 93%. Patient was able to walk 1 lap on 2 lpm continuous oxygen  (tank) and SaO2 at 94%.  Patient c/o fatigue.  Patient was unable to walk any further laps.  Patient taken back to exam room. and SaO2 at  94% and heart rate 80 bpm.  Patient left with an oxygen  tank from [employer] (from  closet).  Oxygen  set on 2 lpm when patient taken to waiting room.  Patient did require assistance to get the oxygen  tank/roller in her car.    Imaging: Personally reviewed and as per EMR discussion in this note No results found.   Lab Results: Personally reviewed CBC    Component Value Date/Time   WBC 7.7 07/25/2024 0807   WBC 15.0 (H) 02/17/2023 1026   RBC 4.80 07/25/2024 0807   RBC 4.66 02/17/2023 1026   HGB 14.3 07/25/2024 0807   HCT 44.9 07/25/2024 0807   PLT 191 07/25/2024 0807   MCV 94 07/25/2024 0807   MCH 29.8 07/25/2024 0807   MCH 29.5 07/11/2022 1912   MCHC 31.8 07/25/2024 0807   MCHC 34.5 02/17/2023 1026   RDW 12.8 07/25/2024 0807   LYMPHSABS 2.1 07/25/2024 0807   MONOABS 0.9 02/17/2023 1026   EOSABS 0.2 07/25/2024 0807   BASOSABS 0.0 07/25/2024 0807    BMET    Component Value Date/Time   NA 139 07/25/2024 0807   K 4.6 07/25/2024 0807   CL 104 07/25/2024 0807   CO2 21 07/25/2024 0807   GLUCOSE 88 07/25/2024 0807   GLUCOSE 119 (H) 02/17/2023 1026   BUN 14 07/25/2024 0807   CREATININE 0.68 07/25/2024 0807   CREATININE 0.71 06/29/2016 1057   CALCIUM  9.4 07/25/2024 0807   GFRNONAA >60 07/11/2022 1912   GFRAA 89 01/01/2020 0835    BNP    Component Value Date/Time   BNP 94.2 06/29/2022 1538   BNP 862.1 (H) 01/08/2021 1630    ProBNP    Component Value Date/Time   PROBNP 145.0 (H) 02/17/2023 1026    Specialty Problems       Pulmonary Problems   COPD with chronic bronchitis (HCC)   Centrilobular emphysema (HCC)   Chronic respiratory failure (HCC)   Pneumonia   Oxygen  dependent    Allergies  Allergen Reactions   Atorvastatin  Other (See Comments)    Leg weakness  atorvastatin    Brilinta  [Ticagrelor ] Shortness Of Breath   Bextra [Valdecoxib] Other (See Comments)    Increased lft's    Hydrocod Poli-Chlorphe Poli Er Rash   Mevacor [Lovastatin] Other (See Comments)    Muscle weakness    Plavix  [Clopidogrel  Bisulfate] Itching and Other  (  See Comments)    Hands and feet tingle and itch   Zocor [Simvastatin] Other (See Comments)    Bones hurt    Doxycycline  Diarrhea and Nausea And Vomiting   Metoprolol  Other (See Comments)    Hair loss   Morphine  And Codeine  Itching and Nausea Only   Codeine  Itching and Rash    Hyper-active   Lisinopril Cough    Immunization History  Administered Date(s) Administered   Fluad Quad(high Dose 65+) 07/30/2019, 08/10/2022   INFLUENZA, HIGH DOSE SEASONAL PF 08/13/2017, 08/26/2018, 09/15/2020   Influenza, Seasonal, Injecte, Preservative Fre 09/21/2014, 09/06/2015, 07/21/2016   Influenza,inj,quad, With Preservative 08/06/2017   Influenza-Unspecified 08/10/2017, 10/13/2021   PFIZER(Purple Top)SARS-COV-2 Vaccination 12/22/2019, 01/12/2020, 09/15/2020   PPD Test 09/13/1979, 03/10/2021   Pfizer Covid-19 Vaccine Bivalent Booster 56yrs & up 10/13/2021   Pneumococcal Conjugate-13 12/31/2016   Pneumococcal Polysaccharide-23 12/16/2014, 08/01/2019   Respiratory Syncytial Virus Vaccine,Recomb Aduvanted(Arexvy) 10/06/2022   Td 05/13/2008   Td (Adult), 2 Lf Tetanus Toxid, Preservative Free 05/13/2008   Tdap 08/09/2012   Zoster Recombinant(Shingrix) 01/13/2018, 03/14/2018   Zoster, Live 11/03/2010    Past Medical History:  Diagnosis Date   AAA (abdominal aortic aneurysm)    a. s/p stent grafting in 2009.   Adenomatous colon polyp 05/2001   Adenomatous duodenal polyp    Allergy    Anginal pain    COPD (chronic obstructive pulmonary disease) (HCC)    pt denies COPD   Coronary artery disease    a. s/p CABG in 1995;  b. 05/2013 Neg MV, EF 82%;  c. 06/2013 Cath: LM nl, LAD 40-50p, LCX nl, RCA 80p/151m, VG->RCA->PDA->PLSA min irregs, LIMA->LAD atretic, vigorous LV fxn.   Diabetes mellitus without complication (HCC)    history of, resolved. 2017   Diverticulosis    Eczema, dyshidrotic    Eczema, dyshidrotic 1986   Dr. Prentice Mayhew   Emphysema of lung [facility])    Fatty liver    Fibromyalgia    GERD  (gastroesophageal reflux disease)    Hyperlipidemia    Hypertension    Migraine    Myocardial infarction [facility])    Osteoarthritis    Osteoporosis    unsure, having a bone scan soon   Peripheral arterial disease    left leg diagnosed by Dr Dominick   Renal artery stenosis    a. 2008 s/p PTA    Tobacco History: Social History   Tobacco Use  Smoking Status Former   Current packs/day: 0.00   Types: Cigarettes   Quit date: 09/05/1996   Years since quitting: 28.1   Passive exposure: Never  Smokeless Tobacco Never   Counseling given: Not Answered   Continue to not smoke  Outpatient Encounter Medications as of 10/16/2024  Medication Sig   Accu-Chek FastClix Lancets MISC Check blood sugar once daily PRN   albuterol  (PROAIR  HFA) 108 (90 Base) MCG/ACT inhaler Inhale 2 puffs into the lungs every 6 (six) hours as needed for wheezing or shortness of breath.   albuterol  (PROVENTIL ) (2.5 MG/3ML) 0.083% nebulizer solution Take 3 mLs (2.5 mg total) by nebulization every 6 (six) hours as needed for wheezing or shortness of breath.   amLODipine  (NORVASC ) 5 MG tablet Take 1/2 tablet ( 2.5 mg ) daily   amoxicillin -clavulanate (AUGMENTIN ) 875-125 MG tablet Take 1 tablet by mouth 2 (two) times daily.   aspirin  EC 81 MG tablet Take 1 tablet (81 mg total) by mouth daily.   atorvastatin  (LIPITOR ) 80 MG tablet TAKE 1 TABLET BY MOUTH EVERY DAY  azithromycin  (ZITHROMAX ) 250 MG tablet Take 1 tablet (250 mg total) by mouth 3 (three) times a week. Monday Wednesday Friday   B Complex-Biotin-FA (B-100 COMPLEX PO) Take 1 tablet by mouth every evening.   bismuth subsalicylate (PEPTO BISMOL) 262 MG/15ML suspension Take 30 mLs by mouth every 6 (six) hours as needed for indigestion or diarrhea or loose stools.   budesonide-glycopyrrolate -formoterol  (BREZTRI  AEROSPHERE) 160-9-4.8 MCG/ACT AERO inhaler Inhale 2 puffs into the lungs in the morning and at bedtime.   cholecalciferol  (VITAMIN D3) 25 MCG (1000 UNIT)  tablet Take 1,000 Units by mouth every evening.   cycloSPORINE  (RESTASIS ) 0.05 % ophthalmic emulsion Place 1 drop into both eyes 2 (two) times daily.    ezetimibe  (ZETIA ) 10 MG tablet TAKE 1 TABLET BY MOUTH EVERY DAY   FLUZONE HIGH-DOSE 0.5 ML injection    gabapentin  (NEURONTIN ) 300 MG capsule Take 1 capsule (300 mg total) by mouth 2 (two) times daily.   glucose blood (ACCU-CHEK SMARTVIEW) test strip CHECK BLOOD SUGAR ONCE DAILY AS NEEDED   guaiFENesin  (MUCINEX  PO) Take by mouth. 1200mg  twice daily   isosorbide  mononitrate (IMDUR ) 30 MG 24 hr tablet TAKE 3 TABLETS ( 90 MG ) EVERY MORNING AND 1 TABLET (30 MG ) EVERY EVENING   losartan  (COZAAR ) 100 MG tablet TAKE 1 TABLET BY MOUTH EVERY DAY   metoprolol  succinate (TOPROL -XL) 25 MG 24 hr tablet TAKE 1 TABLET (25 MG TOTAL) BY MOUTH DAILY.   Multiple Vitamins-Minerals (MULTIVITAMIN WITH MINERALS) tablet Take 1 tablet by mouth every evening.   nitroGLYCERIN  (NITROSTAT ) 0.4 MG SL tablet PLACE 1 TABLET UNDER THE TONGUE EVERY 5 MINUTES AS NEEDED FOR CHEST PAIN.   omeprazole  (PRILOSEC) 40 MG capsule Take 1 capsule (40 mg total) by mouth in the morning and at bedtime. Patient has to be seen in office for additional refills.   ondansetron  (ZOFRAN ) 8 MG tablet Take 1 tablet (8 mg total) by mouth every 6 (six) hours as needed for nausea or vomiting.   OVER THE COUNTER MEDICATION [employer] stool softener daily   oxyCODONE -acetaminophen  (PERCOCET) 10-325 MG tablet Take 1 tablet by mouth in the morning and at bedtime. Can take one tablet three times a day as needed for back pain   polyethylene glycol (MIRALAX  / GLYCOLAX ) 17 g packet Take 17 g by mouth daily as needed for moderate constipation.   prasugrel  (EFFIENT ) 5 MG TABS tablet TAKE 1 TABLET (5 MG TOTAL) BY MOUTH DAILY.   predniSONE  (DELTASONE ) 20 MG tablet Take 2 tablets (40 mg total) by mouth daily with breakfast for 5 days, THEN 1 tablet (20 mg total) daily with breakfast for 5 days.   PRESCRIPTION MEDICATION  Place 2 puffs into both ears daily as needed (ear infections). [facility] ear powder   Simethicone  (GAS-X PO) Take 1-2 tablets by mouth daily as needed (gas).   SPIKEVAX syringe    torsemide  (DEMADEX ) 20 MG tablet Take 1 tablet (20 mg total) by mouth daily. Take 1/2 tablet daily as needed for swelling   tretinoin  (RETIN-A ) 0.1 % cream Apply 1 application. topically every evening.   [DISCONTINUED] HYDROcodone  bit-homatropine (HYCODAN) 5-1.5 MG/5ML syrup Take 5 mLs by mouth every 6 (six) hours as needed for cough.   HYDROcodone  bit-homatropine (HYCODAN) 5-1.5 MG/5ML syrup Take 5 mLs by mouth every 6 (six) hours as needed for cough.   No facility-administered encounter medications on file as of 10/16/2024.     Review of Systems  Review of Systems  N/a Physical Exam  BP (!) 159/71  Pulse 76   Temp (!) 97.4 F (36.3 C)   Ht 4' 11 (1.499 m) Comment: per pt  Wt 131 lb (59.4 kg)   SpO2 90%   BMI 26.46 kg/m   Wt Readings from Last 5 Encounters:  10/16/24 131 lb (59.4 kg)  09/13/24 132 lb (59.9 kg)  07/30/24 135 lb 9.6 oz (61.5 kg)  07/26/24 134 lb (60.8 kg)  07/02/24 132 lb (59.9 kg)    BMI Readings from Last 5 Encounters:  10/16/24 26.46 kg/m  09/13/24 26.21 kg/m  07/30/24 27.39 kg/m  07/26/24 26.61 kg/m  07/02/24 26.21 kg/m     Physical Exam General: Well-appearing, sitting in chair Eyes: No icterus Neck: No JVP Pulmonary: Scattered rhonchi bilaterally, no wheeze, normal work of breathing Cardiovascular, regular rate and rhythm Abdomen: Nondistended, bowel sounds present   Assessment & Plan:   Emphysema with acute exacerbation: CXR with URI.  With some rhonchi on exam.  Prednisone  taper, Augmentin  sent today.  Small supply of hydrocodone  cough syrup prescribed to aid in sleeping at night.  Dyspnea on exertion: Likely multifactorial related to cardiac issues, worsening diastology with exercise given significant left atrial dilation.  Also suspect large contribution  from emphysema.  Severe emphysema on imaging.  Continue Breztri , albuterol  as needed.  Continue cardiology follow-up.  PFTs normal with exception of decreased DLCO due to emphysema.  Nocturnal hypoxemia: Unlikely to improve.  Repeat over the oximetry fall 2024 with ongoing oxygen  need.  Advised continued use of oxygen .  Chronic cough: Related to emphysema.  Chronic bronchitis, asthma.  Continue Breztri .  Okay for intermittent courses of narcotic cough syrup given significant and severe coughing fits.  Overall improved with initiation of thrice weekly azithromycin .    No follow-ups on file.   Donnice JONELLE Beals, MD 10/17/2024

## 2024-10-22 ENCOUNTER — Ambulatory Visit: Admitting: Pulmonary Disease

## 2024-11-05 ENCOUNTER — Ambulatory Visit: Admitting: Pulmonary Disease

## 2024-11-06 ENCOUNTER — Other Ambulatory Visit

## 2024-11-06 ENCOUNTER — Ambulatory Visit: Payer: Medicare Other | Admitting: Internal Medicine

## 2024-11-06 ENCOUNTER — Encounter: Payer: Self-pay | Admitting: Internal Medicine

## 2024-11-06 VITALS — BP 130/58 | Ht 59.0 in | Wt 130.0 lb

## 2024-11-06 DIAGNOSIS — R7303 Prediabetes: Secondary | ICD-10-CM

## 2024-11-06 DIAGNOSIS — E042 Nontoxic multinodular goiter: Secondary | ICD-10-CM | POA: Diagnosis not present

## 2024-11-06 LAB — HEMOGLOBIN A1C
Hgb A1c MFr Bld: 6.1 % — ABNORMAL HIGH (ref <5.7)
Mean Plasma Glucose: 128 mg/dL
eAG (mmol/L): 7.1 mmol/L

## 2024-11-06 NOTE — Progress Notes (Unsigned)
 Name: Megan [name]  MRN/ DOB: [id number], 18-Oct-1946    Age/ Sex: 78 y.o., female    PCP: Jodie Lavern CROME, MD   Reason for Endocrinology Evaluation: MNG     Date of Initial Endocrinology Evaluation: 11/10/2022    HPI: [name] is a 78 y.o. female with a past medical history of PVD, MNG , COPD and CAD . The patient presented for initial endocrinology clinic visit on 11/10/2022 for consultative assistance with her MNG.   She had an incidental finding of a right thyroid  nodule 2.1 cm on CT scan which promoted a thyroid  ultrasound revealing MNG with a right inferior 2.5 nodule meeting FNA criteria.    She has been diagnosed with thyroid  nodules  ~25 years ago . She has has at least two aspirations , the last one in her 76's. [facility] until 2021  ( Dr. Nichole )  She is S/P FNA of the 2.5 right inferior nodule 12/09/2021 with Bethesda Category III  Afirma was benign   Sister with thyroid  disease , S/P RAi ablation     SUBJECTIVE:    Today (11/06/24):  Megan [name] is here for follow-up on thyroid  nodule.  Weight has been stable over the past year  No local neck swelling  No palpitations  Follows with pulmonary for emphysema, walks a mile a day  She has no diarrhea, she does take daily stool softeners as she is on pain medications that causes constipation She does have dizziness in the morning, that is attributed to low BP, she does follow-up with cardiology No Biotin intake    HISTORY:  Past Medical History:  Past Medical History:  Diagnosis Date   AAA (abdominal aortic aneurysm)    a. s/p stent grafting in 2009.   Adenomatous colon polyp 05/2001   Adenomatous duodenal polyp    Allergy    Anginal pain    COPD (chronic obstructive pulmonary disease) (HCC)    pt denies COPD   Coronary artery disease    a. s/p CABG in 1995;  b. 05/2013 Neg MV, EF 82%;  c. 06/2013 Cath: LM nl, LAD 40-50p, LCX nl, RCA 80p/176m, VG->RCA->PDA->PLSA min irregs, LIMA->LAD  atretic, vigorous LV fxn.   Diabetes mellitus without complication (HCC)    history of, resolved. 2017   Diverticulosis    Eczema, dyshidrotic    Eczema, dyshidrotic 1986   Dr. Prentice Mayhew   Emphysema of lung [facility])    Fatty liver    Fibromyalgia    GERD (gastroesophageal reflux disease)    Hyperlipidemia    Hypertension    Migraine    Myocardial infarction [facility])    Osteoarthritis    Osteoporosis    unsure, having a bone scan soon   Peripheral arterial disease    left leg diagnosed by Dr Dominick   Renal artery stenosis    a. 2008 s/p PTA   Past Surgical History:  Past Surgical History:  Procedure Laterality Date   ABDOMINAL AORTAGRAM N/A 03/06/2014   Procedure: ABDOMINAL AORTAGRAM;  Surgeon: Krystal JULIANNA Doing, MD;  Location: [facility] CATH LAB;  Service: Cardiovascular;  Laterality: N/A;   ABDOMINAL AORTIC ANEURYSM REPAIR  2009   stenting   ABDOMINAL HYSTERECTOMY     Total, 1992   BREAST BIOPSY     CARDIAC CATHETERIZATION     CARDIOVASCULAR STRESS TEST  11/11/2009   normal study   CARPAL TUNNEL RELEASE  08/1993   left wrist   CARPAL TUNNEL  RELEASE  01/1997   right   CATARACT EXTRACTION, BILATERAL  2021   CORONARY ARTERY BYPASS GRAFT  07/1994   Triple by Dr Katherene Jude   CORONARY STENT INTERVENTION N/A 02/02/2021   Procedure: CORONARY STENT INTERVENTION;  Surgeon: Mady Bruckner, MD;  Location: [facility] INVASIVE CV LAB;  Service: Cardiovascular;  Laterality: N/A;   CORONARY STENT INTERVENTION N/A 06/26/2021   Procedure: CORONARY STENT INTERVENTION;  Surgeon: Wonda Sharper, MD;  Location: [facility] INVASIVE CV LAB;  Service: Cardiovascular;  Laterality: N/A;   CORONARY ULTRASOUND/IVUS N/A 06/26/2021   Procedure: Intravascular Ultrasound/IVUS;  Surgeon: Wonda Sharper, MD;  Location: [facility] INVASIVE CV LAB;  Service: Cardiovascular;  Laterality: N/A;   CORONARY ULTRASOUND/IVUS N/A 09/03/2021   Procedure: Intravascular Ultrasound/IVUS;  Surgeon: Wendel Lurena POUR, MD;  Location: [facility] INVASIVE CV LAB;   Service: Cardiovascular;  Laterality: N/A;   dental implants     EYE SURGERY  03/2020   cataract   HAND SURGERY Right 09/2002   Right hand pulley release   LAMINECTOMY WITH POSTERIOR LATERAL ARTHRODESIS LEVEL 1 N/A 03/24/2022   Procedure: LAMINECTOMY, FORAMINOTOMY, LUMBAR TWO- THREE ; INTERTRANSVERSE FUSION LUMBAR TWO - LUMBAR THREE RIGHT;  Surgeon: Joshua Alm RAMAN, MD;  Location: [facility] OR;  Service: Neurosurgery;  Laterality: N/A;   LEFT HEART CATH AND CORONARY ANGIOGRAPHY N/A 06/26/2021   Procedure: LEFT HEART CATH AND CORONARY ANGIOGRAPHY;  Surgeon: Wonda Sharper, MD;  Location: [facility] INVASIVE CV LAB;  Service: Cardiovascular;  Laterality: N/A;   LEFT HEART CATH AND CORS/GRAFTS ANGIOGRAPHY N/A 02/02/2021   Procedure: LEFT HEART CATH AND CORS/GRAFTS ANGIOGRAPHY;  Surgeon: Mady Bruckner, MD;  Location: [facility] INVASIVE CV LAB;  Service: Cardiovascular;  Laterality: N/A;   LEFT HEART CATH AND CORS/GRAFTS ANGIOGRAPHY N/A 09/03/2021   Procedure: LEFT HEART CATH AND CORS/GRAFTS ANGIOGRAPHY;  Surgeon: Wendel Lurena POUR, MD;  Location: [facility] INVASIVE CV LAB;  Service: Cardiovascular;  Laterality: N/A;   LEFT HEART CATH AND CORS/GRAFTS ANGIOGRAPHY N/A 07/15/2022   Procedure: LEFT HEART CATH AND CORS/GRAFTS ANGIOGRAPHY;  Surgeon: Jordan, Peter M, MD;  Location: [facility] INVASIVE CV LAB;  Service: Cardiovascular;  Laterality: N/A;   LEFT HEART CATHETERIZATION WITH CORONARY/GRAFT ANGIOGRAM N/A 06/11/2013   Procedure: LEFT HEART CATHETERIZATION WITH EL BILE;  Surgeon: Aleene JINNY Passe, MD;  Location: [facility] CATH LAB;  Service: Cardiovascular;  Laterality: N/A;   MULTIPLE TOOTH EXTRACTIONS  2000   All upper teeth.  Has full dneture.    RENAL ARTERY STENT  2008   SHOULDER ARTHROSCOPY WITH ROTATOR CUFF REPAIR Right 09/27/2018   Procedure: Right shoulder mini open rotator cuff repair;  Surgeon: Duwayne Purchase, MD;  Location: [facility] ORS;  Service: Orthopedics;  Laterality: Right;  90 mins   thyroid  cyst apiration  05/1997  and 07/1998   TONSILECTOMY, ADENOIDECTOMY, BILATERAL MYRINGOTOMY AND TUBES  1989   TONSILLECTOMY     TOTAL ABDOMINAL HYSTERECTOMY  1992    Social History:  reports that she quit smoking about 28 years ago. Her smoking use included cigarettes. She has never been exposed to tobacco smoke. She has never used smokeless tobacco. She reports that she does not currently use alcohol after a past usage of about 6.0 - 10.0 standard drinks of alcohol per week. She reports that she does not use drugs. Family History: family history includes AAA (abdominal aortic aneurysm) in her brother; Breast cancer in her sister; Colon cancer in her cousin and paternal grandmother; Diabetes in her brother and sister; Heart attack in her brother, father, mother, and sister; Heart  disease in her brother, brother, father, mother, and sister; Hyperlipidemia in her brother and sister; Hypertension in her brother and sister; Liver cancer in her sister.   HOME MEDICATIONS: Allergies as of 11/06/2024       Reactions   Atorvastatin  Other (See Comments)   Leg weakness atorvastatin    Brilinta  [ticagrelor ] Shortness Of Breath   Bextra [valdecoxib] Other (See Comments)   Increased lft's   Hydrocod Poli-chlorphe Poli Er Rash   Mevacor [lovastatin] Other (See Comments)   Muscle weakness   Plavix  [clopidogrel  Bisulfate] Itching, Other (See Comments)   Hands and feet tingle and itch   Zocor [simvastatin] Other (See Comments)   Bones hurt   Doxycycline  Diarrhea, Nausea And Vomiting   Metoprolol  Other (See Comments)   Hair loss   Morphine  And Codeine  Itching, Nausea Only   Codeine  Itching, Rash   Hyper-active   Lisinopril Cough        Medication List        Accurate as of November 06, 2024 11:15 AM. If you have any questions, ask your nurse or doctor.          STOP taking these medications    Fluzone High-Dose 0.5 ML injection Generic drug: Influenza vac split trivalent PF Stopped by: Magenta Schmiesing J Gwendalyn Mcgonagle        TAKE these medications    Accu-Chek FastClix Lancets Misc Check blood sugar once daily PRN   Accu-Chek SmartView test strip Generic drug: glucose blood CHECK BLOOD SUGAR ONCE DAILY AS NEEDED   albuterol  (2.5 MG/3ML) 0.083% nebulizer solution Commonly known as: PROVENTIL  Take 3 mLs (2.5 mg total) by nebulization every 6 (six) hours as needed for wheezing or shortness of breath.   albuterol  108 (90 Base) MCG/ACT inhaler Commonly known as: ProAir  HFA Inhale 2 puffs into the lungs every 6 (six) hours as needed for wheezing or shortness of breath.   amLODipine  5 MG tablet Commonly known as: NORVASC  Take 1/2 tablet ( 2.5 mg ) daily   amoxicillin -clavulanate 875-125 MG tablet Commonly known as: AUGMENTIN  Take 1 tablet by mouth 2 (two) times daily.   aspirin  EC 81 MG tablet Take 1 tablet (81 mg total) by mouth daily.   atorvastatin  80 MG tablet Commonly known as: LIPITOR  TAKE 1 TABLET BY MOUTH EVERY DAY   azithromycin  250 MG tablet Commonly known as: ZITHROMAX  Take 1 tablet (250 mg total) by mouth 3 (three) times a week. Monday Wednesday Friday   B-100 COMPLEX PO Take 1 tablet by mouth every evening.   bismuth subsalicylate 262 MG/15ML suspension Commonly known as: PEPTO BISMOL Take 30 mLs by mouth every 6 (six) hours as needed for indigestion or diarrhea or loose stools.   Breztri  Aerosphere 160-9-4.8 MCG/ACT Aero inhaler Generic drug: budesonide-glycopyrrolate -formoterol  Inhale 2 puffs into the lungs in the morning and at bedtime.   cholecalciferol  25 MCG (1000 UNIT) tablet Commonly known as: VITAMIN D3 Take 1,000 Units by mouth every evening.   cycloSPORINE  0.05 % ophthalmic emulsion Commonly known as: RESTASIS  Place 1 drop into both eyes 2 (two) times daily.   ezetimibe  10 MG tablet Commonly known as: ZETIA  TAKE 1 TABLET BY MOUTH EVERY DAY   gabapentin  300 MG capsule Commonly known as: NEURONTIN  Take 1 capsule (300 mg total) by mouth 2 (two) times daily.    GAS-X PO Take 1-2 tablets by mouth daily as needed (gas).   HYDROcodone  bit-homatropine 5-1.5 MG/5ML syrup Commonly known as: HYCODAN Take 5 mLs by mouth every 6 (six) hours as needed  for cough.   isosorbide  mononitrate 30 MG 24 hr tablet Commonly known as: IMDUR  TAKE 3 TABLETS ( 90 MG ) EVERY MORNING AND 1 TABLET (30 MG ) EVERY EVENING   losartan  100 MG tablet Commonly known as: COZAAR  TAKE 1 TABLET BY MOUTH EVERY DAY   metoprolol  succinate 25 MG 24 hr tablet Commonly known as: TOPROL -XL TAKE 1 TABLET (25 MG TOTAL) BY MOUTH DAILY.   MUCINEX  PO Take by mouth. 1200mg  twice daily   multivitamin with minerals tablet Take 1 tablet by mouth every evening.   nitroGLYCERIN  0.4 MG SL tablet Commonly known as: NITROSTAT  PLACE 1 TABLET UNDER THE TONGUE EVERY 5 MINUTES AS NEEDED FOR CHEST PAIN.   omeprazole  40 MG capsule Commonly known as: PRILOSEC Take 1 capsule (40 mg total) by mouth in the morning and at bedtime. Patient has to be seen in office for additional refills.   ondansetron  8 MG tablet Commonly known as: Zofran  Take 1 tablet (8 mg total) by mouth every 6 (six) hours as needed for nausea or vomiting.   OVER THE COUNTER MEDICATION [employer] stool softener daily   oxyCODONE -acetaminophen  10-325 MG tablet Commonly known as: PERCOCET Take 1 tablet by mouth in the morning and at bedtime. Can take one tablet three times a day as needed for back pain   polyethylene glycol 17 g packet Commonly known as: MIRALAX  / GLYCOLAX  Take 17 g by mouth daily as needed for moderate constipation.   prasugrel  5 MG Tabs tablet Commonly known as: EFFIENT  TAKE 1 TABLET (5 MG TOTAL) BY MOUTH DAILY.   PRESCRIPTION MEDICATION Place 2 puffs into both ears daily as needed (ear infections). [facility] ear powder   Spikevax syringe Generic drug: COVID-19 mRNA vaccine   torsemide  20 MG tablet Commonly known as: DEMADEX  Take 1 tablet (20 mg total) by mouth daily. Take 1/2 tablet daily as needed for  swelling   tretinoin  0.1 % cream Commonly known as: RETIN-A  Apply 1 application. topically every evening.          REVIEW OF SYSTEMS: A comprehensive ROS was conducted with the patient and is negative except as per HPI    OBJECTIVE:  VS: BP (!) 130/58   Ht 4' 11 (1.499 m)   Wt 130 lb (59 kg)   BMI 26.26 kg/m    Wt Readings from Last 3 Encounters:  11/06/24 130 lb (59 kg)  10/16/24 131 lb (59.4 kg)  09/13/24 132 lb (59.9 kg)     EXAM: General: Pt appears well and is in NAD  Neck: General: Supple without adenopathy. Thyroid : Right thyroid  nodule appreciated and left inferior nodule appreciated  R> L   Lungs: Clear with good BS bilat with no rales, rhonchi, or wheezes  Heart: Auscultation: RRR.  Abdomen: Normoactive bowel sounds, soft, nontender, without masses or organomegaly palpable  Extremities:  BL LE: No pretibial edema normal ROM and strength.  Mental Status: Judgment, insight: Intact Orientation: Oriented to time, place, and person Mood and affect: No depression, anxiety, or agitation     DATA REVIEWED:  TFTs pending  Latest Reference Range & Units 11/06/24 12:04  eAG (mmol/L) mmol/L 7.1  Hemoglobin A1C <5.7 % 6.1 (H)  (H): Data is abnormally high   Thyroid  Ultrasound 11/09/2023  FINDINGS: Parenchymal Echotexture: Moderately heterogeneous   Isthmus: 0.6 cm ,previously 0.7 cm   Right lobe: 4.5 x 3.1 x 2.7 cm ,previously 4.6 x 1.9 x 2.2 cm   Left lobe: 4.8 x 1.7 x 1.4 cm ,previously 4.4  x 1.4 x 1.1 cm   ________________________________________________________   Estimated total number of nodules >/= 1 cm: 6-10   Number of spongiform nodules >/=  2 cm not described below (TR1): 0   Number of mixed cystic and solid nodules >/= 1.5 cm not described below (TR2): 0   _________________________________________________________   Similar benign appearing inferior isthmus nodule (labeled 1, 0.9 cm, previously 1.2 cm).   Nodule # 2:   Location:  Right; Superior   Maximum size: 1.1 cm; Other 2 dimensions: 0.9 x 0.7 cm   Composition: solid/almost completely solid (2)   Echogenicity: isoechoic (1)   Shape: not taller-than-wide (0)   Margins: smooth (0)   Echogenic foci: none (0)   ACR TI-RADS total points: 3.   ACR TI-RADS risk category: TR3 (3 points).   ACR TI-RADS recommendations:   Given size (<1.4 cm) and appearance, this nodule does NOT meet TI-RADS criteria for biopsy or dedicated follow-up.   _________________________________________________________   Nodule # 3 (previously labeled 2):   Prior biopsy: No   Location: Right; Superior   Maximum size: 1.7 cm; Other 2 dimensions: 1.4 x 1.2 cm, previously, 1.2 x 1.1 x 1.1 cm   Composition: mixed cystic and solid (1)   Echogenicity: hypoechoic (2)   Shape: not taller-than-wide (0)   Margins: ill-defined (0)   Echogenic foci: none (0)   ACR TI-RADS total points: 3.   ACR TI-RADS risk category:  TR3 (3 points).   Significant change in size (>/= 20% in two dimensions and minimal increase of 2 mm): Yes   Change in features: Yes   Change in ACR TI-RADS risk category: Yes   ACR TI-RADS recommendations:   *Given size (>/= 1.5 - 2.4 cm) and appearance, a follow-up ultrasound in 1 year should be considered based on TI-RADS criteria.   _________________________________________________________   Similar appearance of previously biopsied solid nodule in the right mid thyroid  (labeled 4, 2.4 cm, previously labeled 3, 2.5 cm).   Mildly enlarged but similar benign appearing cystic nodule in the left superior thyroid  (labeled 5, 2.1 cm, previously labeled 4, 1.6 cm).   Similar benign appearing spongiform nodules in the left mid inferior thyroid .   No cervical lymphadenopathy.   IMPRESSION: 1. Similar appearing multinodular thyroid . 2. Slight interval enlargement of previously visualized right superior thyroid  nodule (labeled 3, 1.7 cm, previously  labeled 2, 1.2 cm) however with development of cystic spaces. This nodule is now TI-RADS category 3 and again meets criteria for 1 year ultrasound surveillance. 3. Similar appearance of previously biopsied solid nodule in the right mid thyroid  (labeled 4, 2.4 cm, previously with 3, 2.5 cm). Recommend correlation with prior biopsy results.     FNA right inferior 2.5 cm nodule 12/09/2021  Clinical History: Right inferior, 2.5 cm; Other 2 dimensions: 2.5 x 2.1  cm, Solid/almost completely solid, Isoechoic, TI-RADS total points: 6  Specimen Submitted:  A. THYROID , RIGHT INFERIOR NODULE #3, FINE NEEDLE  ASPIRATION:    FINAL MICROSCOPIC DIAGNOSIS:  - Atypia of undetermined significance (Bethesda category III)    Afirma Benign   ASSESSMENT/PLAN/RECOMMENDATIONS:   Multinodular Goiter:   - NO local neck symptoms  - She is S/P  FNA of the right inferior 2.5 cm nodule in 12/2021 , with cytology report consistent with Bethesda category III.  Afirma was benign -TFTs remain normal in the past, repeat TFTs have been normal  2. Prediabetes :   - A1c 5.9% in 07/2024 -Repeat A1c 6.1% - Patient does admit  to dietary indiscretions, but she has been exercising despite a diagnosis of emphysema.  Uses oxygen  when needed  F/ in 1 yr    Signed electronically by: Stefano Redgie Butts, MD  [facility]  [facility] [address] [place], [place] [address] Phone: [phone] FAX: [phone]   CC: Jodie Lavern CROME, MD [address] [place] [address] Phone: [phone] Fax: [phone]   Return to Endocrinology clinic as below: Future Appointments  Date Time Provider Department Center  01/28/2025 10:00 AM Jordan, Peter M, MD [facility]  02/13/2025 11:00 AM Hunsucker, Donnice SAUNDERS, MD [facility] [phone] LELON Das

## 2024-11-08 ENCOUNTER — Ambulatory Visit: Payer: Self-pay | Admitting: Internal Medicine

## 2024-11-08 LAB — T3, FREE: T3, Free: 3.4 pg/mL (ref 2.3–4.2)

## 2024-11-08 LAB — T4, FREE: Free T4: 1.2 ng/dL (ref 0.8–1.8)

## 2024-11-08 LAB — TSH: TSH: 1.5 m[IU]/L (ref 0.40–4.50)

## 2024-11-15 ENCOUNTER — Inpatient Hospital Stay: Admission: RE | Admit: 2024-11-15 | Discharge: 2024-11-15 | Attending: Internal Medicine | Admitting: Internal Medicine

## 2024-11-15 DIAGNOSIS — E042 Nontoxic multinodular goiter: Secondary | ICD-10-CM

## 2024-12-04 ENCOUNTER — Other Ambulatory Visit: Payer: Self-pay | Admitting: Family Medicine

## 2024-12-04 DIAGNOSIS — Z1231 Encounter for screening mammogram for malignant neoplasm of breast: Secondary | ICD-10-CM

## 2024-12-05 ENCOUNTER — Other Ambulatory Visit: Payer: Self-pay | Admitting: Cardiology

## 2024-12-10 NOTE — Telephone Encounter (Signed)
 PAP: Patient assistance application for Breztri  has been approved by PAP Companies: [employer] from 12/06/2024 to 12/05/2025. Medication should be delivered to PAP Delivery: Home. For further shipping updates, please contact [employer] ([employer]) at [phone]. Patient ID is: not provided

## 2025-01-11 ENCOUNTER — Telehealth: Payer: Self-pay | Admitting: Pharmacist

## 2025-01-11 NOTE — Telephone Encounter (Signed)
 Tried to contact patient to see if she has received a delivery from [employer] for 2026 yet for her Breztri .  Patient have been approved to receive Breztri  thru 12/05/2025.  Last Rx was sent to [facility] 05/30/2024 with 3 refills.

## 2025-01-14 ENCOUNTER — Ambulatory Visit

## 2025-01-28 ENCOUNTER — Ambulatory Visit: Admitting: Cardiology

## 2025-02-13 ENCOUNTER — Ambulatory Visit: Admitting: Pulmonary Disease

## 2025-11-06 ENCOUNTER — Ambulatory Visit: Admitting: Internal Medicine
